# Patient Record
Sex: Female | Born: 1950 | Race: Black or African American | Hispanic: No | Marital: Married | Smoking: Never smoker
Health system: Southern US, Community
[De-identification: ages and names within clinical notes are randomized; demographics above are authoritative.]

## PROBLEM LIST (undated history)

## (undated) ENCOUNTER — Emergency Department (HOSPITAL_COMMUNITY): Admission: EM | Disposition: A | Discharge status: Other Acute Inpatient Hospital | Payer: Medicare Other

## (undated) DIAGNOSIS — R0600 Dyspnea, unspecified: Secondary | ICD-10-CM

## (undated) DIAGNOSIS — R51 Headache: Secondary | ICD-10-CM

## (undated) DIAGNOSIS — I509 Heart failure, unspecified: Secondary | ICD-10-CM

## (undated) DIAGNOSIS — E079 Disorder of thyroid, unspecified: Secondary | ICD-10-CM

## (undated) DIAGNOSIS — M1990 Unspecified osteoarthritis, unspecified site: Secondary | ICD-10-CM

## (undated) DIAGNOSIS — R011 Cardiac murmur, unspecified: Secondary | ICD-10-CM

## (undated) DIAGNOSIS — Z9289 Personal history of other medical treatment: Secondary | ICD-10-CM

## (undated) DIAGNOSIS — I10 Essential (primary) hypertension: Secondary | ICD-10-CM

## (undated) DIAGNOSIS — G4730 Sleep apnea, unspecified: Secondary | ICD-10-CM

## (undated) DIAGNOSIS — T8859XA Other complications of anesthesia, initial encounter: Secondary | ICD-10-CM

## (undated) DIAGNOSIS — J45909 Unspecified asthma, uncomplicated: Secondary | ICD-10-CM

## (undated) DIAGNOSIS — J189 Pneumonia, unspecified organism: Secondary | ICD-10-CM

## (undated) DIAGNOSIS — D649 Anemia, unspecified: Secondary | ICD-10-CM

## (undated) DIAGNOSIS — I499 Cardiac arrhythmia, unspecified: Secondary | ICD-10-CM

## (undated) DIAGNOSIS — K56609 Unspecified intestinal obstruction, unspecified as to partial versus complete obstruction: Secondary | ICD-10-CM

## (undated) DIAGNOSIS — Z950 Presence of cardiac pacemaker: Secondary | ICD-10-CM

## (undated) DIAGNOSIS — C801 Malignant (primary) neoplasm, unspecified: Secondary | ICD-10-CM

## (undated) DIAGNOSIS — Z87442 Personal history of urinary calculi: Secondary | ICD-10-CM

## (undated) DIAGNOSIS — R0609 Other forms of dyspnea: Secondary | ICD-10-CM

## (undated) DIAGNOSIS — M797 Fibromyalgia: Secondary | ICD-10-CM

## (undated) DIAGNOSIS — R319 Hematuria, unspecified: Secondary | ICD-10-CM

## (undated) DIAGNOSIS — Z8616 Personal history of COVID-19: Secondary | ICD-10-CM

## (undated) DIAGNOSIS — T4145XA Adverse effect of unspecified anesthetic, initial encounter: Secondary | ICD-10-CM

## (undated) HISTORY — DX: Other forms of dyspnea: R06.09

## (undated) HISTORY — DX: Dyspnea, unspecified: R06.00

## (undated) HISTORY — DX: Headache: R51

## (undated) HISTORY — PX: DIAGNOSTIC LAPAROSCOPY: SUR761

## (undated) HISTORY — PX: COLONOSCOPY W/ POLYPECTOMY: SHX1380

## (undated) HISTORY — PX: UTERINE FIBROID EMBOLIZATION: SHX825

## (undated) HISTORY — PX: THYROIDECTOMY: SHX17

## (undated) HISTORY — DX: Essential (primary) hypertension: I10

## (undated) HISTORY — PX: EYE SURGERY: SHX253

---
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
## 1983-02-21 HISTORY — PX: GASTROPLASTY VERTICAL BANDED: SUR640

## 1983-02-21 HISTORY — PX: GASTROPLASTY: SHX192

## 1984-02-21 HISTORY — PX: TUBAL LIGATION: SHX77

## 1988-02-21 HISTORY — PX: CHOLECYSTECTOMY: SHX55

## 1990-02-20 HISTORY — PX: ROUX-EN-Y GASTRIC BYPASS: SHX1104

## 1993-02-20 HISTORY — PX: LAPAROSCOPIC LYSIS INTESTINAL ADHESIONS: SUR778

## 1998-02-20 HISTORY — PX: SMALL INTESTINE SURGERY: SHX150

## 2002-02-20 HISTORY — PX: ABDOMINAL ADHESION SURGERY: SHX90

## 2008-04-11 ENCOUNTER — Ambulatory Visit: Payer: Self-pay | Admitting: Family Medicine

## 2008-04-11 DIAGNOSIS — M76899 Other specified enthesopathies of unspecified lower limb, excluding foot: Secondary | ICD-10-CM | POA: Insufficient documentation

## 2008-04-11 DIAGNOSIS — M545 Low back pain, unspecified: Secondary | ICD-10-CM | POA: Insufficient documentation

## 2009-08-31 ENCOUNTER — Ambulatory Visit: Payer: Self-pay | Admitting: Pain Medicine

## 2009-11-17 ENCOUNTER — Encounter
Admission: RE | Admit: 2009-11-17 | Discharge: 2009-11-17 | Payer: Self-pay | Source: Home / Self Care | Attending: Internal Medicine | Admitting: Internal Medicine

## 2010-02-20 HISTORY — PX: URETHRAL DILATION: SUR417

## 2010-03-25 ENCOUNTER — Ambulatory Visit (HOSPITAL_COMMUNITY)
Admission: RE | Admit: 2010-03-25 | Discharge: 2010-03-25 | Disposition: A | Discharge status: Home / Self Care | Payer: Federal, State, Local not specified - PPO | Source: Ambulatory Visit | Attending: Gastroenterology | Admitting: Gastroenterology

## 2010-03-25 DIAGNOSIS — IMO0001 Reserved for inherently not codable concepts without codable children: Secondary | ICD-10-CM | POA: Insufficient documentation

## 2010-03-25 DIAGNOSIS — J45909 Unspecified asthma, uncomplicated: Secondary | ICD-10-CM | POA: Insufficient documentation

## 2010-03-25 DIAGNOSIS — K219 Gastro-esophageal reflux disease without esophagitis: Secondary | ICD-10-CM | POA: Insufficient documentation

## 2010-03-25 DIAGNOSIS — Z9884 Bariatric surgery status: Secondary | ICD-10-CM | POA: Insufficient documentation

## 2010-03-25 DIAGNOSIS — I10 Essential (primary) hypertension: Secondary | ICD-10-CM | POA: Insufficient documentation

## 2010-03-30 ENCOUNTER — Other Ambulatory Visit: Payer: Self-pay | Admitting: Internal Medicine

## 2010-03-30 ENCOUNTER — Ambulatory Visit
Admission: RE | Admit: 2010-03-30 | Discharge: 2010-03-30 | Disposition: A | Discharge status: Home / Self Care | Payer: Federal, State, Local not specified - PPO | Source: Ambulatory Visit | Attending: Internal Medicine | Admitting: Internal Medicine

## 2010-03-30 DIAGNOSIS — I2510 Atherosclerotic heart disease of native coronary artery without angina pectoris: Secondary | ICD-10-CM

## 2010-04-04 ENCOUNTER — Ambulatory Visit (HOSPITAL_COMMUNITY)
Admission: RE | Admit: 2010-04-04 | Discharge: 2010-04-04 | Disposition: A | Discharge status: Home / Self Care | Payer: Federal, State, Local not specified - PPO | Source: Ambulatory Visit | Attending: Internal Medicine | Admitting: Internal Medicine

## 2010-04-04 DIAGNOSIS — R079 Chest pain, unspecified: Secondary | ICD-10-CM | POA: Insufficient documentation

## 2010-04-04 DIAGNOSIS — I2789 Other specified pulmonary heart diseases: Secondary | ICD-10-CM | POA: Insufficient documentation

## 2010-04-04 DIAGNOSIS — R0609 Other forms of dyspnea: Secondary | ICD-10-CM | POA: Insufficient documentation

## 2010-04-04 DIAGNOSIS — Z6841 Body Mass Index (BMI) 40.0 and over, adult: Secondary | ICD-10-CM | POA: Insufficient documentation

## 2010-04-04 DIAGNOSIS — Z9884 Bariatric surgery status: Secondary | ICD-10-CM | POA: Insufficient documentation

## 2010-04-04 DIAGNOSIS — Z01818 Encounter for other preprocedural examination: Secondary | ICD-10-CM | POA: Insufficient documentation

## 2010-04-04 DIAGNOSIS — Z01810 Encounter for preprocedural cardiovascular examination: Secondary | ICD-10-CM | POA: Insufficient documentation

## 2010-04-04 DIAGNOSIS — R0989 Other specified symptoms and signs involving the circulatory and respiratory systems: Secondary | ICD-10-CM | POA: Insufficient documentation

## 2010-04-04 HISTORY — PX: CARDIAC CATHETERIZATION: SHX172

## 2010-04-05 LAB — POCT I-STAT 3, VENOUS BLOOD GAS (G3P V)
Bicarbonate: 27 mEq/L — ABNORMAL HIGH (ref 20.0–24.0)
pH, Ven: 7.269 (ref 7.250–7.300)

## 2010-04-05 LAB — POCT I-STAT 3, ART BLOOD GAS (G3+)
Acid-Base Excess: 3 mmol/L — ABNORMAL HIGH (ref 0.0–2.0)
Bicarbonate: 29.7 mEq/L — ABNORMAL HIGH (ref 20.0–24.0)
pCO2 arterial: 55.8 mmHg — ABNORMAL HIGH (ref 35.0–45.0)
pH, Arterial: 7.334 — ABNORMAL LOW (ref 7.350–7.400)
pO2, Arterial: 79 mmHg — ABNORMAL LOW (ref 80.0–100.0)

## 2010-04-06 NOTE — Procedures (Signed)
NAME:  Jillian Eaton, Jillian Eaton NO.:  [id number]  MEDICAL RECORD NO.:  [id number]           PATIENT TYPE:  O  LOCATION:  [facility]                         FACILITY:  [facility]  PHYSICIAN:  [place] Delno Blaisdell, MD         DATE OF BIRTH:  1950-09-03  DATE OF PROCEDURE:  04/04/2010 DATE OF DISCHARGE:  04/04/2010                           CARDIAC CATHETERIZATION   LEFT HEART CATHETERIZATION  OPERATOR:  [place] Tobey Lippard, MD, and Landry Corporal, MD  INDICATION:  Dyspnea on exertion and chest pressure.  HISTORY OF PRESENT ILLNESS:  Jillian Eaton is a morbidly obese (BMI greater than 79) female with a history of failed gastric bypass x2 with an increasing weight gain and increasing shortness of breath as well as new- onset dyspnea on exertion.  She reports that she can only walk about 10 feet before she becomes short of breath and has chest pressure which she says get better with rest.  She has numerous cardiac risk factors and given the high likelihood of false positive stress test, I have referred her for cardiac catheterization, both left and right heart as she has had elevated RVSP on echocardiogram of approximately 30 to 31 mmHg.  PROCEDURE:  The patient was brought into the cardiac catheterization lab, sterilely prepped and draped in the usual fashion.  The area around the right femoral artery and right brachial vein were cleansed and draped to allow an attempt at radial arterial and brachiocephalic venous access.  IV was not able to be obtained prior to the procedure given her body habitus.  With difficulty in assessing the vein, the ultrasound was eventually used to identify the right brachiocephalic vein, however, cannulation with needle and wire was not possible as the vein was very small in caliber.  After mild local bleeding was controlled, we did turn our attention to the right femoral vein and with great difficulty using the ultrasound, the right femoral vein was accessed by Dr.  Herbie Baltimore using a straight wire and a needle.  After venous access was obtained, the attention was turned to the right radial artery by Dr. Herbie Baltimore and simultaneous to that right heart catheterization was performed by myself without any difficulty.  The right radial artery was successfully cannulated and subsequently left coronary artery system was selectively injected with a 5-French TIG 4.0 catheter, however the right coronary artery could not be cannulated with the TIG catheter and was eventually cannulated with a JR-4 catheter.  LV pressure was measured with a pigtail catheter.  Estimated blood loss was about 30 mL.  There were no acute complications.  The patient received a total of 9 mg of Versed throughout the procedure as well as 225 mcg of fentanyl and was at no point greater than moderately sedated.  She received 5000 units of heparin about 10 mL of a radial cocktail.  FINDINGS: 1. Left main - short, no disease. 2. LAD - no significant disease. 3. Left circumflex, no significant disease. 4. RCA - dominant, no disease, large-caliber vessel. 5. LVEDP = 20 mmHg. 6. RA - 12. 7. RV 38/12. 8. PA - 43/19 (31). 9. PCWP - 24.  10.TPG - 7. 11.Fick cardiac output/Fick cardiac index - 10.56/3.84. 12.Thermodilution cardiac output/thermodilution cardiac index -     6.78/2.47. 13.Aortic saturation - 94%. 14.PA saturation - 68%.  IMPRESSION: 1. No significant obstructive coronary artery disease. 2. LVEDP = 20 mmHg. 3. Borderline pulmonary venous hypertension. 4. High cardiac output.  PLAN:  I discussed the results with Jillian Eaton and with her husband, of course focus on weight loss.  I believe her 20-pound weight gain in the last few months may be contributing her shortness of breath in addition to a blood pressure control and maybe an indication for calcium channel blocker.  At best her pulmonary hypertension is borderline to mild which is also agrees well with her echocardiogram  and therefore in the future echocardiogram could be useful following her pulmonary pressures.  After the sheath removal, a TR band was held in place and the venous site was held with venous pressure approximately 10 minutes until bleeding was stopped.  The patient was then placed on 1 hour of bedrest and will have the radial TR band removed once the artery is healed.  A plan is to discharge home today with followup in the office.     [place] Vallorie Niccoli, MD     CH/MEDQ  D:  04/04/2010  T:  04/05/2010  Job:  [id number]  Electronically Signed by K. Loxley Cibrian M.D. on 04/06/2010 10:11:36 AM

## 2010-05-13 ENCOUNTER — Other Ambulatory Visit: Payer: Self-pay | Admitting: Urology

## 2010-05-13 ENCOUNTER — Encounter (HOSPITAL_COMMUNITY): Payer: Federal, State, Local not specified - PPO

## 2010-05-13 LAB — CBC
HCT: 33 % — ABNORMAL LOW (ref 36.0–46.0)
Hemoglobin: 9.9 g/dL — ABNORMAL LOW (ref 12.0–15.0)
MCH: 25.6 pg — ABNORMAL LOW (ref 26.0–34.0)
MCV: 85.5 fL (ref 78.0–100.0)
Platelets: 214 10*3/uL (ref 150–400)
RBC: 3.86 MIL/uL — ABNORMAL LOW (ref 3.87–5.11)
WBC: 7.8 10*3/uL (ref 4.0–10.5)

## 2010-05-13 LAB — BASIC METABOLIC PANEL
BUN: 15 mg/dL (ref 6–23)
CO2: 30 mEq/L (ref 19–32)
Chloride: 106 mEq/L (ref 96–112)
Creatinine, Ser: 0.67 mg/dL (ref 0.4–1.2)
Glucose, Bld: 90 mg/dL (ref 70–99)
Potassium: 4.2 mEq/L (ref 3.5–5.1)

## 2010-05-13 LAB — SURGICAL PCR SCREEN: Staphylococcus aureus: NEGATIVE

## 2010-05-16 ENCOUNTER — Emergency Department (HOSPITAL_COMMUNITY): Payer: Federal, State, Local not specified - PPO

## 2010-05-16 ENCOUNTER — Emergency Department (HOSPITAL_COMMUNITY)
Admission: EM | Admit: 2010-05-16 | Discharge: 2010-05-17 | Disposition: A | Discharge status: Home / Self Care | Payer: Federal, State, Local not specified - PPO | Attending: Emergency Medicine | Admitting: Emergency Medicine

## 2010-05-16 ENCOUNTER — Inpatient Hospital Stay (INDEPENDENT_AMBULATORY_CARE_PROVIDER_SITE_OTHER)
Admission: RE | Admit: 2010-05-16 | Discharge: 2010-05-16 | Disposition: A | Discharge status: Home / Self Care | Payer: Federal, State, Local not specified - PPO | Source: Ambulatory Visit | Attending: Family Medicine | Admitting: Family Medicine

## 2010-05-16 DIAGNOSIS — R0989 Other specified symptoms and signs involving the circulatory and respiratory systems: Secondary | ICD-10-CM | POA: Insufficient documentation

## 2010-05-16 DIAGNOSIS — R0609 Other forms of dyspnea: Secondary | ICD-10-CM

## 2010-05-16 DIAGNOSIS — R0602 Shortness of breath: Secondary | ICD-10-CM | POA: Insufficient documentation

## 2010-05-16 DIAGNOSIS — Z79899 Other long term (current) drug therapy: Secondary | ICD-10-CM | POA: Insufficient documentation

## 2010-05-16 DIAGNOSIS — I10 Essential (primary) hypertension: Secondary | ICD-10-CM | POA: Insufficient documentation

## 2010-05-16 DIAGNOSIS — D649 Anemia, unspecified: Secondary | ICD-10-CM

## 2010-05-16 DIAGNOSIS — E669 Obesity, unspecified: Secondary | ICD-10-CM | POA: Insufficient documentation

## 2010-05-16 DIAGNOSIS — R351 Nocturia: Secondary | ICD-10-CM | POA: Insufficient documentation

## 2010-05-16 DIAGNOSIS — R0601 Orthopnea: Secondary | ICD-10-CM | POA: Insufficient documentation

## 2010-05-16 DIAGNOSIS — R609 Edema, unspecified: Secondary | ICD-10-CM | POA: Insufficient documentation

## 2010-05-16 DIAGNOSIS — R350 Frequency of micturition: Secondary | ICD-10-CM | POA: Insufficient documentation

## 2010-05-16 LAB — DIFFERENTIAL
Eosinophils Relative: 2 % (ref 0–5)
Lymphocytes Relative: 25 % (ref 12–46)
Lymphs Abs: 2.3 10*3/uL (ref 0.7–4.0)
Monocytes Relative: 7 % (ref 3–12)
Neutrophils Relative %: 66 % (ref 43–77)

## 2010-05-16 LAB — POCT URINALYSIS DIP (DEVICE)
Bilirubin Urine: NEGATIVE
Ketones, ur: NEGATIVE mg/dL
Protein, ur: NEGATIVE mg/dL
Specific Gravity, Urine: 1.015 (ref 1.005–1.030)
pH: 6.5 (ref 5.0–8.0)

## 2010-05-16 LAB — BASIC METABOLIC PANEL
BUN: 14 mg/dL (ref 6–23)
CO2: 30 mEq/L (ref 19–32)
Chloride: 107 mEq/L (ref 96–112)
Creatinine, Ser: 0.68 mg/dL (ref 0.4–1.2)
Glucose, Bld: 99 mg/dL (ref 70–99)
Potassium: 4.5 mEq/L (ref 3.5–5.1)

## 2010-05-16 LAB — POCT I-STAT, CHEM 8
BUN: 19 mg/dL (ref 6–23)
Calcium, Ion: 1.09 mmol/L — ABNORMAL LOW (ref 1.12–1.32)
Chloride: 105 mEq/L (ref 96–112)
Creatinine, Ser: 0.8 mg/dL (ref 0.4–1.2)

## 2010-05-16 LAB — CBC
HCT: 34.3 % — ABNORMAL LOW (ref 36.0–46.0)
MCH: 26.5 pg (ref 26.0–34.0)
MCV: 84.1 fL (ref 78.0–100.0)
RBC: 4.08 MIL/uL (ref 3.87–5.11)
WBC: 9.1 10*3/uL (ref 4.0–10.5)

## 2010-05-16 LAB — OCCULT BLOOD, POC DEVICE: Fecal Occult Bld: NEGATIVE

## 2010-05-20 ENCOUNTER — Ambulatory Visit (HOSPITAL_COMMUNITY)
Admission: RE | Admit: 2010-05-20 | Discharge: 2010-05-20 | Disposition: A | Discharge status: Home / Self Care | Payer: Federal, State, Local not specified - PPO | Source: Ambulatory Visit | Attending: Urology | Admitting: Urology

## 2010-05-20 DIAGNOSIS — D649 Anemia, unspecified: Secondary | ICD-10-CM | POA: Insufficient documentation

## 2010-05-20 DIAGNOSIS — Z01812 Encounter for preprocedural laboratory examination: Secondary | ICD-10-CM | POA: Insufficient documentation

## 2010-05-20 DIAGNOSIS — IMO0001 Reserved for inherently not codable concepts without codable children: Secondary | ICD-10-CM | POA: Insufficient documentation

## 2010-05-20 DIAGNOSIS — I10 Essential (primary) hypertension: Secondary | ICD-10-CM | POA: Insufficient documentation

## 2010-05-20 DIAGNOSIS — R310 Gross hematuria: Secondary | ICD-10-CM | POA: Insufficient documentation

## 2010-05-20 DIAGNOSIS — K219 Gastro-esophageal reflux disease without esophagitis: Secondary | ICD-10-CM | POA: Insufficient documentation

## 2010-05-20 DIAGNOSIS — J45909 Unspecified asthma, uncomplicated: Secondary | ICD-10-CM | POA: Insufficient documentation

## 2010-05-20 DIAGNOSIS — N35919 Unspecified urethral stricture, male, unspecified site: Secondary | ICD-10-CM | POA: Insufficient documentation

## 2010-05-27 ENCOUNTER — Encounter: Payer: Federal, State, Local not specified - PPO | Admitting: Hematology and Oncology

## 2010-06-01 ENCOUNTER — Encounter (HOSPITAL_BASED_OUTPATIENT_CLINIC_OR_DEPARTMENT_OTHER): Payer: Federal, State, Local not specified - PPO | Admitting: Hematology and Oncology

## 2010-06-01 ENCOUNTER — Other Ambulatory Visit: Payer: Self-pay | Admitting: Hematology and Oncology

## 2010-06-01 DIAGNOSIS — I10 Essential (primary) hypertension: Secondary | ICD-10-CM

## 2010-06-01 DIAGNOSIS — D649 Anemia, unspecified: Secondary | ICD-10-CM

## 2010-06-01 DIAGNOSIS — E119 Type 2 diabetes mellitus without complications: Secondary | ICD-10-CM

## 2010-06-01 LAB — CBC & DIFF AND RETIC
Basophils Absolute: 0 10*3/uL (ref 0.0–0.1)
EOS%: 2.4 % (ref 0.0–7.0)
HCT: 34.5 % — ABNORMAL LOW (ref 34.8–46.6)
HGB: 10.5 g/dL — ABNORMAL LOW (ref 11.6–15.9)
Immature Retic Fract: 11 % — ABNORMAL HIGH (ref 0.00–10.70)
LYMPH%: 23.5 % (ref 14.0–49.7)
MCH: 26.2 pg (ref 25.1–34.0)
MCV: 86 fL (ref 79.5–101.0)
MONO%: 8 % (ref 0.0–14.0)
NEUT%: 65.7 % (ref 38.4–76.8)
Platelets: 164 10*3/uL (ref 145–400)
lymph#: 1.3 10*3/uL (ref 0.9–3.3)

## 2010-06-01 LAB — MORPHOLOGY: PLT EST: ADEQUATE

## 2010-06-03 LAB — PROTEIN ELECTROPHORESIS, SERUM, WITH REFLEX: Gamma Globulin: 16.8 % (ref 11.1–18.8)

## 2010-06-03 LAB — COMPREHENSIVE METABOLIC PANEL
Alkaline Phosphatase: 90 U/L (ref 39–117)
CO2: 27 mEq/L (ref 19–32)
Creatinine, Ser: 0.63 mg/dL (ref 0.40–1.20)
Glucose, Bld: 83 mg/dL (ref 70–99)
Total Bilirubin: 0.3 mg/dL (ref 0.3–1.2)

## 2010-06-03 LAB — IRON AND TIBC
%SAT: 9 % — ABNORMAL LOW (ref 20–55)
Iron: 43 ug/dL (ref 42–145)
TIBC: 474 ug/dL — ABNORMAL HIGH (ref 250–470)

## 2010-06-03 LAB — HEMOGLOBINOPATHY EVALUATION
Hemoglobin Other: 0 % (ref 0.0–0.0)
Hgb S Quant: 0 % (ref 0.0–0.0)

## 2010-06-03 LAB — DIRECT ANTIGLOBULIN TEST (NOT AT ARMC)
DAT (Complement): NEGATIVE
DAT IgG: NEGATIVE

## 2010-06-03 LAB — LACTATE DEHYDROGENASE: LDH: 241 U/L (ref 94–250)

## 2010-06-03 LAB — HAPTOGLOBIN: Haptoglobin: 147 mg/dL (ref 16–200)

## 2010-06-03 NOTE — Op Note (Signed)
  NAME:  DOMINICK, ZERTUCHE NO.:  [id number]  MEDICAL RECORD NO.:  [id number]           PATIENT TYPE:  O  LOCATION:  [facility]                         FACILITY:  [facility]  PHYSICIAN:  Nabeel Gladson I. Patsi Sears, M.D.DATE OF BIRTH:  Jul 24, 1950  DATE OF PROCEDURE:  05/20/2010 DATE OF DISCHARGE:  05/20/2010                              OPERATIVE REPORT   PREOPERATIVE DIAGNOSIS:  Gross hematuria.  POSTOPERATIVE DIAGNOSIS:  Gross hematuria.  OPERATION:  Urethral dilation and rigid cystoscopy.  SURGEON:  Isiaih Hollenbach I. Patsi Sears, M.D.  ANESTHESIA:  General LMA.  PREPARATION:  After appropriate preanesthesia, the patient was brought to operating room, placed on the operating room in the dorsal supine position where general LMA anesthesia was induced.  She was then replaced in dorsal lithotomy position where the pubis was prepped with Betadine solution and draped in usual fashion.  HISTORY:  The patient is a 60 year old morbidly obese female with a history of intermittent gross painless hematuria.  CT scan was negative. The patient has had flexible cystoscopy in the office which was negative but demands more investigative cystoscopy under anesthesia.  DESCRIPTION OF PROCEDURE:  Urethra was evaluated and the pelvic floor was evaluated and it was felt to be normal.  The urethra appeared to be stenotic as it will not allow the larger operative scope.  Therefore, it was dilated from a 16-French to a 26-French with the female urethral sounds.  Following this, cystoscopy was easily accomplished and showed a normal-appearing bladder.  There was no trabeculation, no bladder stone, no bladder tumor, no bladder diverticular formation.  There was no evidence of fistula formation.  The bladder was drained of fluid, and the patient was then awakened and taken to the recovery room in good condition.     Lakiya Cottam I. Patsi Sears, M.D.     SIT/MEDQ  D:  05/20/2010  T:  05/20/2010  Job:   [id number]  Electronically Signed by Jethro Bolus M.D. on 06/03/2010 05:49:50 PM

## 2010-06-06 ENCOUNTER — Encounter (HOSPITAL_BASED_OUTPATIENT_CLINIC_OR_DEPARTMENT_OTHER): Payer: Federal, State, Local not specified - PPO | Admitting: Hematology and Oncology

## 2010-06-06 DIAGNOSIS — D649 Anemia, unspecified: Secondary | ICD-10-CM

## 2010-07-25 ENCOUNTER — Other Ambulatory Visit: Payer: Self-pay | Admitting: Hematology and Oncology

## 2010-07-25 ENCOUNTER — Encounter (HOSPITAL_BASED_OUTPATIENT_CLINIC_OR_DEPARTMENT_OTHER): Payer: Federal, State, Local not specified - PPO | Admitting: Hematology and Oncology

## 2010-07-25 DIAGNOSIS — D649 Anemia, unspecified: Secondary | ICD-10-CM

## 2010-07-25 LAB — IRON AND TIBC
%SAT: 20 % (ref 20–55)
Iron: 76 ug/dL (ref 42–145)

## 2010-07-25 LAB — BASIC METABOLIC PANEL
CO2: 29 mEq/L (ref 19–32)
Calcium: 10 mg/dL (ref 8.4–10.5)
Sodium: 142 mEq/L (ref 135–145)

## 2010-07-25 LAB — VITAMIN B12: Vitamin B-12: 392 pg/mL (ref 211–911)

## 2010-07-25 LAB — FERRITIN: Ferritin: 210 ng/mL (ref 10–291)

## 2010-07-25 LAB — CBC WITH DIFFERENTIAL/PLATELET
BASO%: 0.4 % (ref 0.0–2.0)
LYMPH%: 23.2 % (ref 14.0–49.7)
MCHC: 33.3 g/dL (ref 31.5–36.0)
MONO#: 0.4 10*3/uL (ref 0.1–0.9)
Platelets: 163 10*3/uL (ref 145–400)
RBC: 3.9 10*6/uL (ref 3.70–5.45)
WBC: 5.7 10*3/uL (ref 3.9–10.3)
lymph#: 1.3 10*3/uL (ref 0.9–3.3)

## 2010-07-28 ENCOUNTER — Encounter (HOSPITAL_BASED_OUTPATIENT_CLINIC_OR_DEPARTMENT_OTHER): Payer: Federal, State, Local not specified - PPO | Admitting: Hematology and Oncology

## 2010-07-28 DIAGNOSIS — R319 Hematuria, unspecified: Secondary | ICD-10-CM

## 2010-07-28 DIAGNOSIS — D649 Anemia, unspecified: Secondary | ICD-10-CM

## 2010-08-26 ENCOUNTER — Other Ambulatory Visit (HOSPITAL_COMMUNITY): Payer: Federal, State, Local not specified - PPO

## 2010-08-26 ENCOUNTER — Inpatient Hospital Stay (HOSPITAL_COMMUNITY): Payer: Federal, State, Local not specified - PPO

## 2010-08-26 ENCOUNTER — Inpatient Hospital Stay (HOSPITAL_COMMUNITY)
Admission: EM | Admit: 2010-08-26 | Discharge: 2010-08-31 | DRG: 097 | Disposition: A | Discharge status: Home / Self Care | Payer: Federal, State, Local not specified - PPO | Attending: Internal Medicine | Admitting: Internal Medicine

## 2010-08-26 ENCOUNTER — Emergency Department (HOSPITAL_COMMUNITY): Payer: Federal, State, Local not specified - PPO

## 2010-08-26 DIAGNOSIS — R0602 Shortness of breath: Secondary | ICD-10-CM | POA: Diagnosis present

## 2010-08-26 DIAGNOSIS — J45901 Unspecified asthma with (acute) exacerbation: Principal | ICD-10-CM | POA: Diagnosis present

## 2010-08-26 DIAGNOSIS — J209 Acute bronchitis, unspecified: Secondary | ICD-10-CM | POA: Diagnosis present

## 2010-08-26 DIAGNOSIS — Z9884 Bariatric surgery status: Secondary | ICD-10-CM

## 2010-08-26 DIAGNOSIS — R791 Abnormal coagulation profile: Secondary | ICD-10-CM | POA: Diagnosis present

## 2010-08-26 DIAGNOSIS — IMO0001 Reserved for inherently not codable concepts without codable children: Secondary | ICD-10-CM | POA: Diagnosis present

## 2010-08-26 DIAGNOSIS — I498 Other specified cardiac arrhythmias: Secondary | ICD-10-CM | POA: Diagnosis present

## 2010-08-26 DIAGNOSIS — D6489 Other specified anemias: Secondary | ICD-10-CM | POA: Diagnosis present

## 2010-08-26 DIAGNOSIS — K5900 Constipation, unspecified: Secondary | ICD-10-CM | POA: Diagnosis not present

## 2010-08-26 DIAGNOSIS — I10 Essential (primary) hypertension: Secondary | ICD-10-CM | POA: Diagnosis present

## 2010-08-26 LAB — BASIC METABOLIC PANEL
BUN: 13 mg/dL (ref 6–23)
GFR calc Af Amer: >60 mL/min (ref >60)
GFR calc non Af Amer: >60 mL/min (ref >60)
Potassium: 4 mEq/L (ref 3.5–5.1)
Sodium: 141 mEq/L (ref 135–145)

## 2010-08-26 LAB — COMPREHENSIVE METABOLIC PANEL
ALT: 23 U/L (ref 0–35)
AST: 29 U/L (ref 0–37)
Alkaline Phosphatase: 128 U/L — ABNORMAL HIGH (ref 39–117)
CO2: 29 mEq/L (ref 19–32)
Chloride: 102 mEq/L (ref 96–112)
Creatinine, Ser: 0.71 mg/dL (ref 0.50–1.10)
GFR calc non Af Amer: >60 mL/min (ref >60)
Potassium: 4.1 mEq/L (ref 3.5–5.1)
Total Bilirubin: 0.3 mg/dL (ref 0.3–1.2)

## 2010-08-26 LAB — TROPONIN I: Troponin I: <0.3 ng/mL (ref <0.30)

## 2010-08-26 LAB — URINALYSIS, ROUTINE W REFLEX MICROSCOPIC
Bilirubin Urine: NEGATIVE
Nitrite: NEGATIVE
Specific Gravity, Urine: 1.015 (ref 1.005–1.030)
pH: 6 (ref 5.0–8.0)

## 2010-08-26 LAB — CBC
HCT: 38.9 % (ref 36.0–46.0)
MCV: 89 fL (ref 78.0–100.0)
RBC: 4.37 MIL/uL (ref 3.87–5.11)
WBC: 6.9 10*3/uL (ref 4.0–10.5)

## 2010-08-26 LAB — DIFFERENTIAL
Basophils Relative: 0 % (ref 0–1)
Eosinophils Relative: 0 % (ref 0–5)
Lymphocytes Relative: 9 % — ABNORMAL LOW (ref 12–46)
Neutrophils Relative %: 86 % — ABNORMAL HIGH (ref 43–77)

## 2010-08-26 LAB — CARDIAC PANEL(CRET KIN+CKTOT+MB+TROPI): CK, MB: 3.6 ng/mL (ref 0.3–4.0)

## 2010-08-26 LAB — URINE MICROSCOPIC-ADD ON

## 2010-08-26 LAB — PRO B NATRIURETIC PEPTIDE: Pro B Natriuretic peptide (BNP): 699.5 pg/mL — ABNORMAL HIGH (ref 0–125)

## 2010-08-26 LAB — D-DIMER, QUANTITATIVE: D-Dimer, Quant: 1.2 ug/mL-FEU — ABNORMAL HIGH (ref 0.00–0.48)

## 2010-08-27 ENCOUNTER — Inpatient Hospital Stay (HOSPITAL_COMMUNITY): Payer: Federal, State, Local not specified - PPO

## 2010-08-27 DIAGNOSIS — I369 Nonrheumatic tricuspid valve disorder, unspecified: Secondary | ICD-10-CM

## 2010-08-27 LAB — CARDIAC PANEL(CRET KIN+CKTOT+MB+TROPI)
CK, MB: 3.2 ng/mL (ref 0.3–4.0)
Relative Index: 1.6 (ref 0.0–2.5)
Troponin I: <0.3 ng/mL (ref <0.30)

## 2010-08-27 LAB — BLOOD GAS, ARTERIAL
Acid-Base Excess: 2.7 mmol/L — ABNORMAL HIGH (ref 0.0–2.0)
Drawn by: 103701
FIO2: 0.21 %
O2 Saturation: 88.6 %
Patient temperature: 98.6

## 2010-08-27 MED ORDER — IOHEXOL 300 MG/ML  SOLN
100.0000 mL | Freq: Once | INTRAMUSCULAR | Status: AC | PRN
  Administered 2010-08-27: 100 mL via INTRAVENOUS

## 2010-08-28 LAB — BASIC METABOLIC PANEL
Calcium: 9.1 mg/dL (ref 8.4–10.5)
Creatinine, Ser: 1.05 mg/dL (ref 0.50–1.10)
GFR calc non Af Amer: 54 mL/min — ABNORMAL LOW (ref >60)
Sodium: 135 mEq/L (ref 135–145)

## 2010-08-28 LAB — CBC
MCH: 28.5 pg (ref 26.0–34.0)
MCHC: 31.5 g/dL (ref 30.0–36.0)
MCV: 90.2 fL (ref 78.0–100.0)
Platelets: 142 10*3/uL — ABNORMAL LOW (ref 150–400)

## 2010-09-01 NOTE — Discharge Summary (Signed)
  NAMEJAIDAN, STACHNIK NO.:  [id number]  MEDICAL RECORD NO.:  [id number]  LOCATION:                                 FACILITY:  PHYSICIAN:  Pleas Koch, MD             DATE OF BIRTH:  DATE OF ADMISSION: DATE OF DISCHARGE:                              DISCHARGE SUMMARY   ADDENDUM: Addendum to dictation number [id number]  The patient is doing well.  On day of discharge, she was having no significant complaints.  She expressed some concern about shortness of breath, however, otherwise, she is doing well.  Pulse is 63, her temperature is 97.8, respiration is 20, blood pressure 137 to 110 over 77 to 79, saturating 99% on 2 L.  She states that she still felt like she was wheezy overnight and she was a little bit concerned about the length of her shortness of breath episode.  However, she feels that this she will resolve from this.  She has had sleep apnea workup three times in the past and I have recommended to her that if this continues, she will need to follow up with pulmonologist as she seems to have this every year.  She complained of significant constipation, has not passed stool since Thursday despite having good appetite and eating well.  I am discharging her home on polyethylene glycol 17 g three times a day. I expect that this may be multifactorial given the fact that she has history of chronic pain, uses opiates as well.  She had no abdominal tenderness.  She had no nausea or vomiting.  Chest clinically clear.  S1 and S2.  No murmurs, rubs or gallops.  She is morbidly obese.          ______________________________ Pleas Koch, MD     JS/MEDQ  D:  08/31/2010  T:  08/31/2010  Job:  [id number]  cc:   Candyce Churn. Allyne Gee, M.D. Fax: [phone]  Electronically Signed by Pleas Koch MD on 09/01/2010 09:05:45 PM

## 2010-09-08 NOTE — Discharge Summary (Signed)
NAMEANIEYA, HELMAN NO.:  [id number]  MEDICAL RECORD NO.:  [id number]  LOCATION:  1513                         FACILITY:  [facility]  PHYSICIAN:  Ladell Pier, M.D.   DATE OF BIRTH:  [birth year]  DATE OF ADMISSION:  08/26/2010 DATE OF DISCHARGE:  08/31/2010                              DISCHARGE SUMMARY   DISCHARGE DIAGNOSES: 1. Asthma exacerbation/acute bronchitis with negative CT angio of the     chest, negative Dopplers, negative 2-D echo. 2. Bradycardia, asymptomatic.  The patient is to follow up with her     cardiologist outpatient. 3. Morbid obesity with history of gastric bypass. 4. Hypertension. 5. Anemia, followed outpatient by Dr. Dalene Carrow. 6. Fibromyalgia. 7. History of fibroids. 8. Tonsillectomy and adenoidectomy. 9. Status post cholecystectomy. 10.History of small-bowel obstruction. 11.History of chest pain and cardiac cath in February 2012 that was     negative. 12.Constipation.  DISCHARGE MEDICATIONS: 1. Albuterol nebulizer every 6 hours as needed. 2. Tessalon Perles 100 mg 3 times a day as needed. 3. Avelox 400 mg daily. 4. Prednisone 40 mg daily for 3 days, then 20 daily for 3 days, then     10 daily for 3 days, then stop. 5. Norvasc 2.5 mg daily. 6. Calcium carbonate over-the-counter daily. 7. Celebrex 200 mg twice daily. 8. Vitamin B12 injections monthly. 9. Cymbalta 60 mg every evening. 10.Hydrocodone 10/325 three times daily. 11.Losartan 100 mg daily. 12.Lyrica 200 mg twice daily. 13.Tizanidine 4 mg four times daily as needed. 14.Toviaz 8 mg daily. 15.Vitamin D2 Mondays, Wednesdays and Fridays. 16.Ambien CR 12.5 mg q.h.s. p.r.n.  FOLLOWUP APPOINTMENTS:  The patient to follow up with Dr. Lelon Perla in 1 week.  For the bradycardia, she will follow up with Dr. Rennis Golden and for her anemia Dr. Dalene Carrow.  PROCEDURES: 1. CT angio of the chest: No evidence of PE, mild right basilar     scarring versus atelectasis. 2. Chest x-ray, no acute  findings 3. A 2-D echo:  LV systolic function was normal at 60%.  Regional wall     motion abnormalities cannot be excluded.  RV was poorly visualized.     There was no pericardial effusion. 4. Dopplers of lower extremity to rule out DVT negative.  CONSULTANT:  None.  HISTORY OF PRESENT ILLNESS:  The patient is a 60 year old African- American female with past medical history significant for hypertension and obesity.  The patient presented secondary to shortness of breath for the past 5 days.  She has been short of breath.  She presented to her PCP's office, was given neb treatments and Z-Pak.  She is taking medications but does not feel better, so she presented to the emergency room.  In the emergency room, she was diagnosed with asthma exacerbation.  Her sats were dropping into the 70s and 80s with ambulation.  She normally has mild asthma but she has never been intubated for her asthma.  She only had severe episodes when she was a child.  Past medical history, family history, social history, meds, allergies, review of systems; per admission H and P.  DISCHARGE PHYSICAL EXAM:  VITAL SIGNS:  Temperature 97.8, pulse 61, respirations 16, blood pressure 120/72, pulse ox 99% on  2 L. GENERAL:  The patient is sitting up in bed, well nourished African- American female. HEENT:  Normocephalic, atraumatic.  Pupils reactive to light without erythema. CARDIOVASCULAR:  Regular rate and rhythm. LUNGS:  She did have some rhonchi and some minimal wheezes. ABDOMEN:  Positive bowel sounds. EXTREMITIES:  Without edema.  HOSPITAL COURSE: 1. Aspirin exacerbation/dyspnea:  The patient was admitted to the     hospital, placed on Solu-Medrol, antibiotic and neb treatments.     She was also placed on Lovenox initially and she had a CT angio of     the chest that was negative for PE.  Her Lovenox was then     discontinued.  The patient is doing well and will be discharged     home on steroids,  antibiotics and nebs to follow up with PCP.  Her     oxygen sats does drop when she is ambulating and is likely to be a     component of obesity hypoventilation syndrome.  We will discharge     her with home oxygen and she could follow up with her PCP to taper     off the oxygen. 2. Constipation:  She did complain of some constipation, tried     MiraLax, Dulcolax suppositories and Senokot.  She is going to try     some more Senokot today as she has been unsuccessful.  She,     however, does not have any abdominal pain or nausea, vomiting. 3. Anemia:  She gets iron infusion per Dr. Dalene Carrow.  She has follow up     in the office for continued treatment. 4. Bradycardia:  Her heart rate was noted to be in the 50s, it will go     up to the 80s and in the 60s.  The patient was asymptomatic during     those episodes, not sure if that is new or not.  She recently had a     cath per Dr. Rennis Golden.  She could follow up with him.  Did discuss     this with the patient.  DISCHARGE LABORATORY DATA:  Sodium 135, potassium 4.7, chloride 98, CO2 30, glucose 122, BUN 33, creatinine 1.05.  WBC 9.4, hemoglobin 10.5, MCV 90.2, platelet 142.  Time spent with the patient and doing this discharge is approximately 45 minutes.     Ladell Pier, M.D.     NJ/MEDQ  D:  08/30/2010  T:  08/30/2010  Job:  [id number]  cc:   Dr. Saunders  [name], MD Fax: [phone]  Laurice Record, M.D. Fax: [phone]  Electronically Signed by Ladell Pier M.D. on 09/08/2010 11:03:13 PM

## 2010-09-08 NOTE — H&P (Signed)
Jillian Eaton, Jillian Eaton NO.:  [id number]  MEDICAL RECORD NO.:  [id number]  LOCATION:  [facility]                         FACILITY:  [facility]  PHYSICIAN:  Ladell Pier, M.D.   DATE OF BIRTH:  08/19/1950  DATE OF ADMISSION:  08/26/2010 DATE OF DISCHARGE:                             HISTORY & PHYSICAL   CHIEF COMPLAINT:  Shortness of breath.  HISTORY OF PRESENT ILLNESS:  The patient is a 60 year old morbidly obese African-American female who presented to the emergency room with shortness of breath.  Per discussion with the patient, for the past 5 days she has been short of breath.  She presented to her PCP's office and was given neb treatments and a Z-Pak.  She is taking the medications but does not feel better, so she presented to the emergency room.  She normally does not have really bad asthma except when she was a child. She has never been intubated..  She does not have any chest pain.  She recently traveled to [facility] on August 15, 2010.  She also complains of some nausea and vomiting and pain on her left side.  PAST MEDICAL HISTORY: 1. Significant for morbid obesity with a history of gastric bypass. 2. Hypertension. 3. Anemia. 4. Fibromyalgia. 5. Fibroids. 6. Tonsillectomy and adenoidectomy. 7. Status post cholecystectomy. 8. History of small bowel obstruction. 9. History of chest pain with cardiac cath in February 2012 that was     negative.  FAMILY HISTORY:  Both parents are deceased.  Mother had epilepsy and died of cancer in her 15s.  Father died in his 47s from heart attack.  SOCIAL HISTORY:  The patient is married and she does not smoke.  She drinks socially.  She has three children.  She is retired.  MEDICATIONS: 1. Azithromycin 250 mg daily. 2. Ambien CR 12.5 mg at bedtime p.r.n. 3. Vitamin D 50,000 units Mondays, Wednesdays, Fridays. 4. Toviaz 8 mg daily. 5. Tizanidine 4 mg 4 times daily as needed. 6. Lyrica 200 mg twice daily. 7. Losartan 100 mg  every morning. 8. Hydrocodone/APAP 10/325 three times a day. 9. Cymbalta 60 mg every evening. 10.Vitamin B12 injections monthly and latter part of the month. 11.Celebrex 200 mg twice daily. 12.Calcium over-the-counter daily. 13.Amlodipine 2.5 mg daily.  ALLERGIES:  PERCOCET.  REVIEW OF SYSTEMS:  Negative, otherwise, as stated in the HPI.  PHYSICAL EXAM:  VITAL SIGNS:  Temperature 98.9, pulse of 86, respirations 20, blood pressure 168/94, pulse ox 96% on room and dropped to the 80s with ambulation. GENERAL:  The patient is sitting up on the stretcher, morbidly obese African-American female. HEENT: Normocephalic, atraumatic.  Pupils reactive to light.  Throat is without erythema. CARDIOVASCULAR:  Regular rate and rhythm. LUNGS:  Her lungs were clear bilaterally. ABDOMEN:  Obese.  Positive bowel sounds. EXTREMITIES:  A 2+ edema and both fairly obese lower extremities.  LABORATORY DATA:  Urine negative.  WBC 6.9, hemoglobin 12.2, MCV 9, platelet 116.  Sodium 141, potassium 4.0, chloride 103, CO2 of 29, BUN 13, creatinine 0.7, glucose 140.  Chest x-ray, stable cardiomegaly and vascular congestion.  ASSESSMENT/PLAN: 1. Dyspnea. 2. Hypertension. 3. Fibromyalgia. 4. Morbid obesity.  We will admit the patient to  the hospital with history of recent travel. We will put her on Lovenox and get a V/Q scan, D-dimer and Dopplers of the legs to make sure she does not have PE.  I does not hear any wheezing but we will put her on IV Avelox and Solu-Medrol along with neb treatments for the shortness of breath.  When she ambulates, her sats drops to 80s, not sure with her obesity if that is typical for her or not.  She most likely has sleep apnea.  We will also get a 2-D echo, although she had a cath back in April that was normal and she does not have any chest pain at this time.  We will get a BNP level and we will also get EKG and 2-D echo as mentioned before.  For her chronic  medical problems, we will continue her on her home medications.  Time spent with the patient during this admission is approximately 45 minutes.     Ladell Pier, M.D.     NJ/MEDQ  D:  08/26/2010  T:  08/26/2010  Job:  [id number]  Electronically Signed by Ladell Pier M.D. on 09/08/2010 11:03:25 PM

## 2010-09-22 ENCOUNTER — Encounter: Payer: Self-pay | Admitting: Internal Medicine

## 2010-09-22 ENCOUNTER — Ambulatory Visit (INDEPENDENT_AMBULATORY_CARE_PROVIDER_SITE_OTHER): Payer: Federal, State, Local not specified - PPO | Admitting: Internal Medicine

## 2010-09-22 DIAGNOSIS — J45909 Unspecified asthma, uncomplicated: Secondary | ICD-10-CM | POA: Insufficient documentation

## 2010-09-22 DIAGNOSIS — E669 Obesity, unspecified: Secondary | ICD-10-CM | POA: Insufficient documentation

## 2010-09-22 MED ORDER — BUDESONIDE-FORMOTEROL FUMARATE 160-4.5 MCG/ACT IN AERO: INHALATION_SPRAY | RESPIRATORY_TRACT | Status: DC

## 2010-09-22 NOTE — Patient Instructions (Addendum)
For any symptoms at all wheezing, coughing, chest tightnessness use symbicort 160 2 puffs every 12 hours   Work on inhaler technique:  relax and gently blow all the way out then take a nice smooth deep breath back in, triggering the inhaler at same time you start breathing in.  Hold for up to 5 seconds if you can.  Rinse and gargle with water when done   If your mouth or throat starts to bother you,   I suggest you time the inhaler to your dental care and after using the inhaler(s) brush teeth and tongue with a baking soda containing toothpaste and when you rinse this out, gargle with it first to see if this helps your mouth and throat.     GERD (REFLUX)  is an extremely common cause of respiratory symptoms, many times with no significant heartburn at all.    It can be treated with medication, but also with lifestyle changes including avoidance of late meals, excessive alcohol, smoking cessation, and avoid fatty foods, chocolate, peppermint, colas, red wine, and acidic juices such as orange juice.  NO MINT OR MENTHOL PRODUCTS SO NO COUGH DROPS  USE SUGARLESS CANDY INSTEAD (jolley ranchers or Stover's)  NO OIL BASED VITAMINS    If you are satisfied with your treatment plan let your doctor know and he/she can either refill your medications or you can return here when your prescription runs out.     If in any way you are not 100% satisfied,  please tell us.  If 100% better, tell your friends!

## 2010-09-22 NOTE — Assessment & Plan Note (Addendum)
DDX of  difficult airways managment all start with A and  include Adherence, Ace Inhibitors, Acid Reflux, Active Sinus Disease, Alpha 1 Antitripsin deficiency, Anxiety masquerading as Airways dz,  ABPA,  allergy(esp in young), Aspiration (esp in elderly), Adverse effects of DPI,  Active smokers, plus two Bs  = Bronchiectasis and Beta blocker use..and one C= CHF  Adherence is always the initial "prime suspect" and is a multilayered concern that requires a "trust but verify" approach in every patient - starting with knowing how to use medications, especially inhalers, correctly, keeping up with refills and understanding the fundamental difference between maintenance and prns vs those medications only taken for a very short course and then stopped and not refilled. The proper method of use, as well as anticipated side effects, of this metered-dose inhaler are discussed and demonstrated to the patient. Improved to 50% at best p extensive coaching.  She is relatively high risk of future asthma flares because she doesn't want to use a controlled and doesn't recognize always when she's having a flare, preferring to call it allergies or bronchitis or colds, but I did the best I could to explain that even doctors can't sort these triggers out but the key is to start treating the asthma immediately if any respiratory symptoms start (rec "get to the fire early an you won't need to hit it with as big a hose to put it out")  ? GERD> risk due to obesity (similar to mechanism of flare during first pregnancy)  See instructions for specific recommendations which were reviewed directly with the patient who was given a copy with highlighter outlining the key components.

## 2010-09-22 NOTE — Progress Notes (Signed)
Subjective:     Patient ID: Jillian Eaton, female   DOB: 04-Jan-1951, 60 y.o.   MRN: [id number]  HPI  72 yobf never smoker with asthma as child, outgrew until first pregnancy when became recurrent but can go for months at a time with no rx but since age 39 it's become an" annual thing:" where she flares and requires prednisone rx.  Had been on advair but doesn't think she needs a maintenance inhaler.  Referred to pulmonary clinic Aug 2012 p exposure to heavy dust/ heat > hosp x  6 days at [facility].    09/22/2010 post hosp   Initial pulmonary office eval / Jillian Eaton  Cc sob with activity like going to mailbox and back. Last prednisone ran out x 2 weeks.  Has not needed any inhaler since coming off prednisone though wheezed a little bit the last couple of nights.  No excess or purulent sputum.  Pt denies any significant sore throat, dysphagia, itching, sneezing,  nasal congestion or excess/ purulent secretions,  fever, chills, sweats, unintended wt loss, pleuritic or exertional cp, hempoptysis, orthopnea pnd or leg swelling.    Also denies any obvious fluctuation of symptoms with weather or environmental changes or other aggravating or alleviating factors.   .   Review of Systems  Constitutional: Positive for appetite change. Negative for fever, chills and unexpected weight change.  HENT: Positive for congestion and sore throat. Negative for ear pain, nosebleeds, rhinorrhea, sneezing, trouble swallowing, dental problem, voice change, postnasal drip and sinus pressure.   Eyes: Negative for visual disturbance.  Respiratory: Positive for cough and shortness of breath. Negative for choking.   Cardiovascular: Positive for leg swelling. Negative for chest pain.  Gastrointestinal: Positive for abdominal pain. Negative for vomiting and diarrhea.  Genitourinary: Negative for difficulty urinating.  Musculoskeletal: Positive for arthralgias.  Skin: Negative for rash.  Neurological: Positive for headaches. Negative  for tremors and syncope.  Hematological: Does not bruise/bleed easily.       Objective:   Physical Exam amb bf who appears very frustrated at times and   failed to answer a single question asked in a straightforward manner, tending to go off on tangents or answer questions with ambiguous medical terms or diagnoses and seemed somewhat perplexed  when asked the same question more than once for clarification.  Wt 417 09/22/2010 f/u ov/Jillian Eaton cc HEENT: nl dentition, turbinates, and orophanx. Nl external ear canals without cough reflex   NECK :  without JVD/Nodes/TM/ nl carotid upstrokes bilaterally   LUNGS: no acc muscle use, clear to A and P bilaterally without cough on insp or exp maneuvers   CV:  RRR  no s3 or murmur or increase in P2, no edema   ABD:  soft and nontender but massively obese.  No bruits or organomegaly, bowel sounds nl  MS:  warm without deformities, calf tenderness, cyanosis or clubbing  SKIN: warm and dry without lesions    NEURO:  alert, approp, no deficits       Assessment:         Plan:

## 2010-09-30 ENCOUNTER — Ambulatory Visit (HOSPITAL_COMMUNITY)
Admission: RE | Admit: 2010-09-30 | Discharge: 2010-09-30 | Disposition: A | Discharge status: Home / Self Care | Payer: Federal, State, Local not specified - PPO | Source: Ambulatory Visit | Attending: Gastroenterology | Admitting: Gastroenterology

## 2010-09-30 ENCOUNTER — Other Ambulatory Visit: Payer: Self-pay | Admitting: Gastroenterology

## 2010-09-30 DIAGNOSIS — K644 Residual hemorrhoidal skin tags: Secondary | ICD-10-CM | POA: Insufficient documentation

## 2010-09-30 DIAGNOSIS — Z1211 Encounter for screening for malignant neoplasm of colon: Secondary | ICD-10-CM | POA: Insufficient documentation

## 2010-09-30 DIAGNOSIS — I10 Essential (primary) hypertension: Secondary | ICD-10-CM | POA: Insufficient documentation

## 2010-09-30 DIAGNOSIS — D126 Benign neoplasm of colon, unspecified: Secondary | ICD-10-CM | POA: Insufficient documentation

## 2010-09-30 DIAGNOSIS — Z79899 Other long term (current) drug therapy: Secondary | ICD-10-CM | POA: Insufficient documentation

## 2010-09-30 DIAGNOSIS — K5730 Diverticulosis of large intestine without perforation or abscess without bleeding: Secondary | ICD-10-CM | POA: Insufficient documentation

## 2010-09-30 DIAGNOSIS — K648 Other hemorrhoids: Secondary | ICD-10-CM | POA: Insufficient documentation

## 2010-11-09 ENCOUNTER — Other Ambulatory Visit: Payer: Self-pay | Admitting: Obstetrics and Gynecology

## 2010-11-09 DIAGNOSIS — Z1231 Encounter for screening mammogram for malignant neoplasm of breast: Secondary | ICD-10-CM

## 2010-11-09 DIAGNOSIS — R1032 Left lower quadrant pain: Secondary | ICD-10-CM

## 2010-11-14 ENCOUNTER — Ambulatory Visit
Admission: RE | Admit: 2010-11-14 | Discharge: 2010-11-14 | Disposition: A | Discharge status: Home / Self Care | Payer: Federal, State, Local not specified - PPO | Source: Ambulatory Visit | Attending: Obstetrics and Gynecology | Admitting: Obstetrics and Gynecology

## 2010-11-14 DIAGNOSIS — R1032 Left lower quadrant pain: Secondary | ICD-10-CM

## 2010-11-14 MED ORDER — IOHEXOL 300 MG/ML  SOLN
125.0000 mL | Freq: Once | INTRAMUSCULAR | Status: AC | PRN
  Administered 2010-11-14: 125 mL via INTRAVENOUS

## 2010-11-21 ENCOUNTER — Ambulatory Visit: Payer: Federal, State, Local not specified - PPO

## 2010-11-21 ENCOUNTER — Ambulatory Visit
Admission: RE | Admit: 2010-11-21 | Discharge: 2010-11-21 | Disposition: A | Discharge status: Home / Self Care | Payer: Federal, State, Local not specified - PPO | Source: Ambulatory Visit | Attending: Obstetrics and Gynecology | Admitting: Obstetrics and Gynecology

## 2010-11-21 DIAGNOSIS — Z1231 Encounter for screening mammogram for malignant neoplasm of breast: Secondary | ICD-10-CM

## 2010-11-25 ENCOUNTER — Encounter (HOSPITAL_BASED_OUTPATIENT_CLINIC_OR_DEPARTMENT_OTHER): Payer: Federal, State, Local not specified - PPO | Admitting: Hematology and Oncology

## 2010-11-25 ENCOUNTER — Other Ambulatory Visit: Payer: Self-pay | Admitting: Hematology and Oncology

## 2010-11-25 DIAGNOSIS — I10 Essential (primary) hypertension: Secondary | ICD-10-CM

## 2010-11-25 DIAGNOSIS — D649 Anemia, unspecified: Secondary | ICD-10-CM

## 2010-11-25 DIAGNOSIS — E119 Type 2 diabetes mellitus without complications: Secondary | ICD-10-CM

## 2010-11-25 LAB — CBC WITH DIFFERENTIAL/PLATELET
BASO%: 0.5 % (ref 0.0–2.0)
EOS%: 2.4 % (ref 0.0–7.0)
HCT: 34.6 % — ABNORMAL LOW (ref 34.8–46.6)
LYMPH%: 26.9 % (ref 14.0–49.7)
MCH: 30.6 pg (ref 25.1–34.0)
MCHC: 32.8 g/dL (ref 31.5–36.0)
MONO#: 0.5 10*3/uL (ref 0.1–0.9)
NEUT%: 60.7 % (ref 38.4–76.8)
Platelets: 144 10*3/uL — ABNORMAL LOW (ref 145–400)

## 2010-11-25 LAB — BASIC METABOLIC PANEL
BUN: 25 mg/dL — ABNORMAL HIGH (ref 6–23)
CO2: 28 mEq/L (ref 19–32)
Calcium: 9.4 mg/dL (ref 8.4–10.5)
Creatinine, Ser: 1.09 mg/dL (ref 0.50–1.10)

## 2010-11-25 LAB — IRON AND TIBC: UIBC: 295 ug/dL (ref 125–400)

## 2010-12-09 ENCOUNTER — Encounter (HOSPITAL_BASED_OUTPATIENT_CLINIC_OR_DEPARTMENT_OTHER): Payer: Federal, State, Local not specified - PPO | Admitting: Hematology and Oncology

## 2010-12-09 DIAGNOSIS — D126 Benign neoplasm of colon, unspecified: Secondary | ICD-10-CM

## 2010-12-09 DIAGNOSIS — D649 Anemia, unspecified: Secondary | ICD-10-CM

## 2011-02-10 ENCOUNTER — Other Ambulatory Visit: Payer: Self-pay | Admitting: Internal Medicine

## 2011-02-20 ENCOUNTER — Emergency Department (HOSPITAL_COMMUNITY): Payer: Federal, State, Local not specified - PPO

## 2011-02-20 ENCOUNTER — Emergency Department (HOSPITAL_COMMUNITY)
Admission: EM | Admit: 2011-02-20 | Discharge: 2011-02-20 | Disposition: A | Discharge status: Home / Self Care | Payer: Federal, State, Local not specified - PPO | Attending: Emergency Medicine | Admitting: Emergency Medicine

## 2011-02-20 DIAGNOSIS — J3489 Other specified disorders of nose and nasal sinuses: Secondary | ICD-10-CM | POA: Insufficient documentation

## 2011-02-20 DIAGNOSIS — I10 Essential (primary) hypertension: Secondary | ICD-10-CM | POA: Insufficient documentation

## 2011-02-20 DIAGNOSIS — R05 Cough: Secondary | ICD-10-CM | POA: Insufficient documentation

## 2011-02-20 DIAGNOSIS — Z79899 Other long term (current) drug therapy: Secondary | ICD-10-CM | POA: Insufficient documentation

## 2011-02-20 DIAGNOSIS — J40 Bronchitis, not specified as acute or chronic: Secondary | ICD-10-CM | POA: Insufficient documentation

## 2011-02-20 DIAGNOSIS — R0602 Shortness of breath: Secondary | ICD-10-CM | POA: Insufficient documentation

## 2011-02-20 DIAGNOSIS — R059 Cough, unspecified: Secondary | ICD-10-CM | POA: Insufficient documentation

## 2011-02-20 DIAGNOSIS — J45909 Unspecified asthma, uncomplicated: Secondary | ICD-10-CM | POA: Insufficient documentation

## 2011-02-20 MED ORDER — PREDNISONE 20 MG PO TABS
60.0000 mg | ORAL_TABLET | Freq: Once | ORAL | Status: AC
  Administered 2011-02-20: 60 mg via ORAL
  Filled 2011-02-20: qty 3

## 2011-02-20 MED ORDER — BENZONATATE 100 MG PO CAPS: 100.0000 mg | ORAL_CAPSULE | Freq: Three times a day (TID) | ORAL | Status: AC

## 2011-02-20 MED ORDER — IPRATROPIUM BROMIDE 0.02 % IN SOLN
0.5000 mg | Freq: Once | RESPIRATORY_TRACT | Status: AC
  Administered 2011-02-20: 0.5 mg via RESPIRATORY_TRACT
  Filled 2011-02-20: qty 2.5

## 2011-02-20 MED ORDER — ALBUTEROL SULFATE (5 MG/ML) 0.5% IN NEBU
15.0000 mg | INHALATION_SOLUTION | Freq: Once | RESPIRATORY_TRACT | Status: AC
  Administered 2011-02-20: 15 mg via RESPIRATORY_TRACT
  Filled 2011-02-20 (×2): qty 3

## 2011-02-20 MED ORDER — PREDNISONE (PAK) 10 MG PO TABS: ORAL_TABLET | ORAL | Status: DC

## 2011-02-20 MED ORDER — ALBUTEROL SULFATE (5 MG/ML) 0.5% IN NEBU: 5.0000 mg | INHALATION_SOLUTION | RESPIRATORY_TRACT | Status: DC | PRN

## 2011-02-20 MED ORDER — ALBUTEROL SULFATE (5 MG/ML) 0.5% IN NEBU
5.0000 mg | INHALATION_SOLUTION | Freq: Once | RESPIRATORY_TRACT | Status: AC
  Administered 2011-02-20: 5 mg via RESPIRATORY_TRACT
  Filled 2011-02-20: qty 1

## 2011-02-20 NOTE — ED Notes (Signed)
Pt ambulatory to BR with minimal assistance.  

## 2011-02-20 NOTE — ED Notes (Signed)
Pt with 1hr neb. Pt in NAD.

## 2011-02-20 NOTE — ED Notes (Signed)
PA at bedside.

## 2011-02-20 NOTE — ED Provider Notes (Signed)
History     CSN: [id number]  Arrival date & time 02/20/11  1205   First MD Initiated Contact with Patient 02/20/11 1304     1:53 PM HPI This reports the day after Christmas began having nasal congestion, rhinorrhea and cough. States symptoms have worsened. Reports cough is nonproductive. States is causing significant shortness of breath especially with laying flat. Denies chest pain, fever, nausea, abdominal pain, diarrhea, sore throat, back pain. Reports that the last time she was hospitalized in July for bronchitis.  States she's been using her albuterol inhaler twice a day without relief.  Patient is a 60 y.o. female presenting with cough. The history is provided by the patient.  Cough This is a new problem. The current episode started more than 2 days ago. The problem occurs every few minutes. The problem has been gradually worsening. The cough is productive of sputum. There has been no fever. Associated symptoms include rhinorrhea, shortness of breath and wheezing. Pertinent negatives include no chest pain, no chills, no headaches, no sore throat and no myalgias. She has tried decongestants for the symptoms. The treatment provided no relief. She is not a smoker. Her past medical history is significant for asthma.    Past Medical History  Diagnosis Date  . Asthma   . Hypertension   . Chronic headache     Past Surgical History  Procedure Date  . Cholecystectomy 1990  . Gastroplasty 1985  . Tubal ligation 1986    Family History  Problem Relation Age of Onset  . Coronary artery disease    . Breast cancer Mother   . Breast cancer Paternal Grandmother   . Stroke    . Diabetes Father   . Diabetes Mother   . Hypertension Father   . Hypertension Mother   . Asthma Father     History  Substance Use Topics  . Smoking status: Never Smoker   . Smokeless tobacco: Never Used  . Alcohol Use: Yes     wine occ    OB History    Grav Para Term Preterm Abortions TAB SAB Ect Mult  Living                  Review of Systems  Constitutional: Negative for fever and chills.  HENT: Positive for rhinorrhea. Negative for sore throat, neck pain, neck stiffness and sinus pressure.   Respiratory: Positive for cough, shortness of breath and wheezing.   Cardiovascular: Negative for chest pain.  Gastrointestinal: Negative for nausea, vomiting, abdominal pain and diarrhea.  Musculoskeletal: Negative for myalgias.  Neurological: Negative for dizziness, numbness and headaches.  All other systems reviewed and are negative.    Allergies  Percocet  Home Medications   Current Outpatient Rx  Name Route Sig Dispense Refill  . ALBUTEROL SULFATE HFA 108 (90 BASE) MCG/ACT IN AERS Inhalation Inhale 2 puffs into the lungs every 6 (six) hours as needed.      Marland Kitchen AMLODIPINE BESYLATE 2.5 MG PO TABS Oral Take 2.5 mg by mouth daily.      . BUDESONIDE-FORMOTEROL FUMARATE 160-4.5 MCG/ACT IN AERO  Take 2 puffs first thing in am and then another 2 puffs about 12 hours later.    1 Inhaler 12  . CELECOXIB 200 MG PO CAPS Oral Take 200 mg by mouth 2 (two) times daily as needed.      . DULOXETINE HCL 60 MG PO CPEP Oral Take 60 mg by mouth daily.      Marland Kitchen HYDROCHLOROTHIAZIDE 12.5 MG  PO TABS Oral Take 12.5 mg by mouth daily.      Marland Kitchen HYDROCODONE-ACETAMINOPHEN 10-325 MG PO TABS Oral Take 1 tablet by mouth 3 (three) times daily as needed.      Marland Kitchen LOSARTAN POTASSIUM 100 MG PO TABS Oral Take 100 mg by mouth daily.      . OXYBUTYNIN CHLORIDE ER 15 MG PO TB24 Oral Take 15 mg by mouth daily.      Marland Kitchen PREGABALIN 200 MG PO CAPS Oral Take 200 mg by mouth 3 (three) times daily.      Marland Kitchen TIZANIDINE HCL 4 MG PO CAPS  1/2 to 1 tablet 4 times per day as needed     . VITAMIN D (ERGOCALCIFEROL) 50000 UNITS PO CAPS Oral Take 50,000 Units by mouth. 3 times per wk     . ZOLPIDEM TARTRATE ER 12.5 MG PO TBCR Oral Take 12.5 mg by mouth at bedtime as needed.        BP 164/81  Pulse 101  Temp(Src) 98.2 F (36.8 C) (Oral)  Resp  20  Wt 397 lb (180.078 kg)  SpO2 97%  Physical Exam  Vitals reviewed. Constitutional: She is oriented to person, place, and time. Vital signs are normal. She appears well-developed and well-nourished.  HENT:  Head: Normocephalic and atraumatic.  Right Ear: External ear normal.  Left Ear: External ear normal.  Nose: Nose normal.  Mouth/Throat: Uvula is midline and oropharynx is clear and moist. Mucous membranes are dry. No oropharyngeal exudate.  Eyes: Conjunctivae are normal. Pupils are equal, round, and reactive to light.  Neck: Normal range of motion. Neck supple.  Cardiovascular: Normal rate, regular rhythm and normal heart sounds.  Exam reveals no friction rub.   No murmur heard. Pulmonary/Chest: Effort normal. She has wheezes in the right upper field, the right lower field and the left lower field. She has no rhonchi. She has no rales. She exhibits no tenderness.  Musculoskeletal: Normal range of motion.  Neurological: She is alert and oriented to person, place, and time. Coordination normal.  Skin: Skin is warm and dry. No rash noted. No erythema. No pallor.    ED Course  Procedures    MDM   Patient reports symptoms have slightly improved. Lung exam is now benign for wheezing. Discharge home without fever all nebulizer minutes, and prescription for prednisone. Strongly advised patient to return for worsening symptoms. Also discussed weight loss would be important in helping her shortness of breath. Daughter shows a significant concern and asked me to speak to her mother about changing her sedentary lifestyle. Advised her that she should go with mother to primary care physician appointment discussed this with her. Patient was ready for discharge voices understanding       Thomasene Lot, [place] 02/20/11 1734

## 2011-02-20 NOTE — ED Notes (Signed)
Pt states "have been coughing since after Christmas, called MD & got albuterol, was given home oxygen for temporary use after a 6 day hospitalization in jULY"

## 2011-02-21 NOTE — ED Provider Notes (Signed)
Medical screening examination/treatment/procedure(s) were performed by non-physician practitioner and as supervising physician I was immediately available for consultation/collaboration.  Israella Hubert M Kathyjo Briere, MD 02/21/11 0037 

## 2011-03-01 ENCOUNTER — Telehealth: Payer: Self-pay | Admitting: *Deleted

## 2011-03-01 ENCOUNTER — Other Ambulatory Visit: Payer: Self-pay | Admitting: Internal Medicine

## 2011-03-01 ENCOUNTER — Encounter (HOSPITAL_COMMUNITY): Payer: Self-pay | Admitting: *Deleted

## 2011-03-01 ENCOUNTER — Other Ambulatory Visit: Payer: Self-pay | Admitting: *Deleted

## 2011-03-01 ENCOUNTER — Telehealth: Payer: Self-pay | Admitting: Hematology and Oncology

## 2011-03-01 ENCOUNTER — Emergency Department (HOSPITAL_COMMUNITY)
Admission: EM | Admit: 2011-03-01 | Discharge: 2011-03-02 | Disposition: A | Discharge status: Home / Self Care | Payer: Federal, State, Local not specified - PPO | Attending: Emergency Medicine | Admitting: Emergency Medicine

## 2011-03-01 DIAGNOSIS — R310 Gross hematuria: Secondary | ICD-10-CM

## 2011-03-01 DIAGNOSIS — R319 Hematuria, unspecified: Secondary | ICD-10-CM | POA: Insufficient documentation

## 2011-03-01 DIAGNOSIS — D649 Anemia, unspecified: Secondary | ICD-10-CM

## 2011-03-01 DIAGNOSIS — M549 Dorsalgia, unspecified: Secondary | ICD-10-CM

## 2011-03-01 DIAGNOSIS — I10 Essential (primary) hypertension: Secondary | ICD-10-CM | POA: Insufficient documentation

## 2011-03-01 HISTORY — DX: Hematuria, unspecified: R31.9

## 2011-03-01 LAB — URINALYSIS, ROUTINE W REFLEX MICROSCOPIC
Glucose, UA: NEGATIVE mg/dL
Ketones, ur: NEGATIVE mg/dL
Specific Gravity, Urine: 1.011 (ref 1.005–1.030)
pH: 7.5 (ref 5.0–8.0)

## 2011-03-01 LAB — URINE MICROSCOPIC-ADD ON

## 2011-03-01 NOTE — Telephone Encounter (Signed)
Spoke with pt at home and informed pt that  A scheduler will contact pt with appts  For lab 1/10  And f/u with Huntley Dec, [place] on 03/03/11.    Pt voiced understanding.

## 2011-03-01 NOTE — Telephone Encounter (Signed)
Pt called in for lb/fu appts after s/w nurse today. gv pt appts for lb 1/10 and f/i 1/11 w/SW per 1/9 elec pof.

## 2011-03-01 NOTE — ED Notes (Signed)
Pt reports hematuria since Saturday w/ accompanying lower back pain and pelvic pressure. Pt w/ hx of urinary problems - pt has informed her PCP and urologist of this.

## 2011-03-01 NOTE — Telephone Encounter (Signed)
Dr. Dalene Carrow spoke with pt's primary Dr. Allyne Gee.   Spoke with pt on cell phone and informed pt re:   Dr. Allyne Gee' office will contact pt with workup plan for pt ;  Dr. Dalene Carrow would like for pt to follow  Dr. Allyne Gee' instructions and advice.    Informed pt that if pt has urgent issues ,  Pt needs to go to ER for further evaluation.    Pt voiced understanding.

## 2011-03-02 ENCOUNTER — Other Ambulatory Visit: Payer: Federal, State, Local not specified - PPO

## 2011-03-02 ENCOUNTER — Other Ambulatory Visit: Payer: Federal, State, Local not specified - PPO | Admitting: Lab

## 2011-03-02 LAB — DIFFERENTIAL
Basophils Relative: 0 % (ref 0–1)
Eosinophils Relative: 3 % (ref 0–5)
Lymphs Abs: 2.1 10*3/uL (ref 0.7–4.0)
Monocytes Absolute: 0.7 10*3/uL (ref 0.1–1.0)
Monocytes Relative: 10 % (ref 3–12)
Neutro Abs: 3.7 10*3/uL (ref 1.7–7.7)

## 2011-03-02 LAB — BASIC METABOLIC PANEL
BUN: 19 mg/dL (ref 6–23)
CO2: 26 mEq/L (ref 19–32)
Chloride: 104 mEq/L (ref 96–112)
Creatinine, Ser: 0.86 mg/dL (ref 0.50–1.10)
Glucose, Bld: 87 mg/dL (ref 70–99)

## 2011-03-02 LAB — CBC
HCT: 38 % (ref 36.0–46.0)
MCH: 29.5 pg (ref 26.0–34.0)
MCV: 92 fL (ref 78.0–100.0)
RBC: 4.13 MIL/uL (ref 3.87–5.11)
WBC: 6.7 10*3/uL (ref 4.0–10.5)

## 2011-03-02 LAB — URINE CULTURE
Colony Count: NO GROWTH
Culture: NO GROWTH

## 2011-03-02 MED ORDER — CIPROFLOXACIN HCL 500 MG PO TABS: 500.0000 mg | ORAL_TABLET | Freq: Two times a day (BID) | ORAL | Status: AC

## 2011-03-02 MED ORDER — NAPROXEN 500 MG PO TABS: 500.0000 mg | ORAL_TABLET | Freq: Two times a day (BID) | ORAL | Status: DC

## 2011-03-02 MED ORDER — CIPROFLOXACIN HCL 500 MG PO TABS
500.0000 mg | ORAL_TABLET | Freq: Once | ORAL | Status: AC
  Administered 2011-03-02: 500 mg via ORAL
  Filled 2011-03-02: qty 1

## 2011-03-02 NOTE — ED Provider Notes (Signed)
History     CSN: [id number]  Arrival date & time 03/01/11  2005   First MD Initiated Contact with Patient 03/02/11 0206      Chief Complaint  Patient presents with  . Hematuria  . Back Pain    (Consider location/radiation/quality/duration/timing/severity/associated sxs/prior treatment) HPI Comments: [place]-year-old female with a history of hematuria over the last year. Also with history of hypertension, chronic headaches and some functional urinary incontinence. She has had blood in her urine for the last 5 days. It is gross blood, associated with pressure in her lower abdomen and lower back. It is not associated with fevers, chills, nausea or vomiting. She has been seen by her urologist, her hematologist and her family Dr. and no one has an answer for her recurrent hematuria.  Usually her symptoms only last for half a day but it lasted for 5 days. She has no source of her chronic anemia despite a very thorough workup. She had a cystoscopy performed approximately one year ago and was told there was no signs of bladder cancer.  Patient is a 61 y.o. female presenting with hematuria and back pain. The history is provided by the patient and the spouse.  Hematuria  Back Pain     Past Medical History  Diagnosis Date  . Asthma   . Hypertension   . Chronic headache   . Urinary incontinence, functional   . Hematuria - cause not known     Past Surgical History  Procedure Date  . Cholecystectomy 1990  . Gastroplasty 1985  . Tubal ligation 1986    Family History  Problem Relation Age of Onset  . Coronary artery disease    . Breast cancer Mother   . Breast cancer Paternal Grandmother   . Stroke    . Diabetes Father   . Diabetes Mother   . Hypertension Father   . Hypertension Mother   . Asthma Father     History  Substance Use Topics  . Smoking status: Never Smoker   . Smokeless tobacco: Never Used  . Alcohol Use: Yes     wine occ    OB History    Grav Para Term Preterm  Abortions TAB SAB Ect Mult Living                  Review of Systems  Genitourinary: Positive for hematuria.  Musculoskeletal: Positive for back pain.  All other systems reviewed and are negative.    Allergies  Percocet  Home Medications   Current Outpatient Rx  Name Route Sig Dispense Refill  . ALBUTEROL SULFATE HFA 108 (90 BASE) MCG/ACT IN AERS Inhalation Inhale 2 puffs into the lungs every 6 (six) hours as needed. For shortness of breath.    . ALBUTEROL SULFATE (5 MG/ML) 0.5% IN NEBU Nebulization Take 1 mL (5 mg total) by nebulization every 4 (four) hours as needed for wheezing or shortness of breath. 20 mL 2  . AMLODIPINE BESYLATE 2.5 MG PO TABS Oral Take 2.5 mg by mouth every morning.     . BUDESONIDE-FORMOTEROL FUMARATE 160-4.5 MCG/ACT IN AERO  2 puffs 2 (two) times daily.      . CELECOXIB 200 MG PO CAPS Oral Take 200 mg by mouth 2 (two) times daily as needed. For pain.    . DULOXETINE HCL 60 MG PO CPEP Oral Take 60 mg by mouth every evening.     Marland Kitchen ESZOPICLONE 3 MG PO TABS Oral Take 3 mg by mouth at bedtime. Take immediately  before bedtime     . HYDROCODONE-ACETAMINOPHEN 10-325 MG PO TABS Oral Take 1 tablet by mouth every 6 (six) hours as needed. For pain.    Marland Kitchen LIDOCAINE 5 % EX PTCH Transdermal Place 1-2 patches onto the skin daily as needed. Applies to back.     Marland Kitchen LOSARTAN POTASSIUM 100 MG PO TABS Oral Take 100 mg by mouth every morning.     . OXYBUTYNIN CHLORIDE ER 15 MG PO TB24 Oral Take 15 mg by mouth every morning.     Marland Kitchen PREGABALIN 200 MG PO CAPS Oral Take 200 mg by mouth 3 (three) times daily.      Marland Kitchen TIZANIDINE HCL 4 MG PO CAPS  2-4 mg 4 (four) times daily as needed. For spasms.    . TOPIRAMATE 50 MG PO TABS Oral Take 50 mg by mouth 2 (two) times daily.      Marland Kitchen VITAMIN D (ERGOCALCIFEROL) 50000 UNITS PO CAPS Oral Take 50,000 Units by mouth 2 (two) times a week.     Marland Kitchen CIPROFLOXACIN HCL 500 MG PO TABS Oral Take 1 tablet (500 mg total) by mouth every 12 (twelve) hours. 20  tablet 0  . NAPROXEN 500 MG PO TABS Oral Take 1 tablet (500 mg total) by mouth 2 (two) times daily with a meal. 30 tablet 0    BP 122/64  Pulse 88  Temp(Src) 97.3 F (36.3 C) (Oral)  Resp 20  SpO2 100%  Physical Exam  Nursing note and vitals reviewed. Constitutional: She appears well-developed and well-nourished. No distress.       Morbidly obese  HENT:  Head: Normocephalic and atraumatic.  Mouth/Throat: Oropharynx is clear and moist. No oropharyngeal exudate.  Eyes: Conjunctivae and EOM are normal. Pupils are equal, round, and reactive to light. Right eye exhibits no discharge. Left eye exhibits no discharge. No scleral icterus.  Neck: Normal range of motion. Neck supple. No JVD present. No thyromegaly present.  Cardiovascular: Normal rate, regular rhythm, normal heart sounds and intact distal pulses.  Exam reveals no gallop and no friction rub.   No murmur heard. Pulmonary/Chest: Effort normal and breath sounds normal. No respiratory distress. She has no wheezes. She has no rales.  Abdominal: Soft. Bowel sounds are normal. She exhibits no distension and no mass. There is no tenderness.  Musculoskeletal: Normal range of motion. She exhibits no edema and no tenderness.  Lymphadenopathy:    She has no cervical adenopathy.  Neurological: She is alert. Coordination normal.  Skin: Skin is warm and dry. No rash noted. No erythema.  Psychiatric: She has a normal mood and affect. Her behavior is normal.    ED Course  Procedures (including critical care time)  Labs Reviewed  URINALYSIS, ROUTINE W REFLEX MICROSCOPIC - Abnormal; Notable for the following:    Color, Urine RED (*) BIOCHEMICALS MAY BE AFFECTED BY COLOR   APPearance CLOUDY (*)    Hgb urine dipstick LARGE (*)    Leukocytes, UA TRACE (*)    All other components within normal limits  BASIC METABOLIC PANEL - Abnormal; Notable for the following:    GFR calc non Af Amer 73 (*)    GFR calc Af Amer 85 (*)    All other  components within normal limits  URINE MICROSCOPIC-ADD ON  CBC  DIFFERENTIAL  URINE CULTURE  PROTIME-INR  APTT   No results found.   1. Hematuria   2. Back pain   3. Hypertension       MDM  No other  findings on physical exam, urinalysis shows significant hematuria but no bacteria seen, very few white blood cells seen. Will culture the urine, check CBC for anemia or thrombus and ischemia, coagulation studies and basic metabolic panel to check renal function.   No anemia, no thrombocytopenia, normal metabolic panel and hematuria. Have given ciprofloxacin prior to discharge, urine culture and followup with urology. She appears stable for discharge       Vida Roller, MD 03/02/11 [phone]

## 2011-03-02 NOTE — ED Notes (Addendum)
Patient stable upon discharge to home.   Patient discharged via with husband in wheelchair not by ems

## 2011-03-02 NOTE — ED Notes (Signed)
Attempted to start IV x 2, unsuccessful. 

## 2011-03-03 ENCOUNTER — Ambulatory Visit: Payer: Federal, State, Local not specified - PPO | Admitting: Physician Assistant

## 2011-03-06 ENCOUNTER — Ambulatory Visit
Admission: RE | Admit: 2011-03-06 | Discharge: 2011-03-06 | Disposition: A | Discharge status: Home / Self Care | Payer: Federal, State, Local not specified - PPO | Source: Ambulatory Visit | Attending: Internal Medicine | Admitting: Internal Medicine

## 2011-03-06 DIAGNOSIS — R310 Gross hematuria: Secondary | ICD-10-CM

## 2011-03-09 ENCOUNTER — Other Ambulatory Visit (HOSPITAL_BASED_OUTPATIENT_CLINIC_OR_DEPARTMENT_OTHER): Payer: Federal, State, Local not specified - PPO | Admitting: Lab

## 2011-03-09 DIAGNOSIS — R319 Hematuria, unspecified: Secondary | ICD-10-CM

## 2011-03-09 DIAGNOSIS — D649 Anemia, unspecified: Secondary | ICD-10-CM

## 2011-03-09 LAB — CBC WITH DIFFERENTIAL/PLATELET
EOS%: 2.5 % (ref 0.0–7.0)
Eosinophils Absolute: 0.1 10*3/uL (ref 0.0–0.5)
MCV: 92.8 fL (ref 79.5–101.0)
MONO%: 10.5 % (ref 0.0–14.0)
NEUT#: 2.6 10*3/uL (ref 1.5–6.5)
RBC: 3.8 10*6/uL (ref 3.70–5.45)
RDW: 15.7 % — ABNORMAL HIGH (ref 11.2–14.5)
lymph#: 1.4 10*3/uL (ref 0.9–3.3)

## 2011-03-09 LAB — IRON AND TIBC
%SAT: 22 % (ref 20–55)
Iron: 77 ug/dL (ref 42–145)
UIBC: 266 ug/dL (ref 125–400)

## 2011-03-09 LAB — BASIC METABOLIC PANEL
Glucose, Bld: 94 mg/dL (ref 70–99)
Potassium: 4.1 mEq/L (ref 3.5–5.3)
Sodium: 143 mEq/L (ref 135–145)

## 2011-03-09 LAB — FERRITIN: Ferritin: 247 ng/mL (ref 10–291)

## 2011-03-10 ENCOUNTER — Ambulatory Visit: Payer: Federal, State, Local not specified - PPO | Admitting: Physician Assistant

## 2011-03-11 ENCOUNTER — Telehealth: Payer: Self-pay | Admitting: Hematology and Oncology

## 2011-03-11 NOTE — Telephone Encounter (Signed)
Talked to pt, trying to give her appt for February 19th lab and MD visit for 2/22, pt informed me that she was supposed to be seen by Durenda Age 59/18 and did not show due to snow, called Thu left a message, regarding appt.

## 2011-03-14 ENCOUNTER — Other Ambulatory Visit: Payer: Self-pay | Admitting: *Deleted

## 2011-03-15 ENCOUNTER — Telehealth: Payer: Self-pay | Admitting: Hematology and Oncology

## 2011-03-15 NOTE — Telephone Encounter (Signed)
S/w pt today re appt for 2/5 @ 10:15 am. Date/RJ per 1/22 pof

## 2011-03-28 ENCOUNTER — Ambulatory Visit (HOSPITAL_BASED_OUTPATIENT_CLINIC_OR_DEPARTMENT_OTHER): Payer: Federal, State, Local not specified - PPO | Admitting: Physician Assistant

## 2011-03-28 VITALS — BP 144/85 | HR 75 | Temp 96.7°F | Ht 67.0 in | Wt 389.4 lb

## 2011-03-28 DIAGNOSIS — R319 Hematuria, unspecified: Secondary | ICD-10-CM

## 2011-03-28 DIAGNOSIS — D369 Benign neoplasm, unspecified site: Secondary | ICD-10-CM

## 2011-03-28 DIAGNOSIS — D649 Anemia, unspecified: Secondary | ICD-10-CM

## 2011-03-28 DIAGNOSIS — D638 Anemia in other chronic diseases classified elsewhere: Secondary | ICD-10-CM | POA: Insufficient documentation

## 2011-03-28 NOTE — Progress Notes (Signed)
CC:   [name], M.D. [name], MD [name], M.D. Wynona Canes, M.D.  IDENTIFYING STATEMENT:  Jillian Eaton is a 61 year old black female with history of anemia, who presents for followup.  INTERIM HISTORY:  Jillian Eaton reports since her last clinic visit in October 2012 that she has switched primary care physicians and now is seen by Dr. [name].  She states she had an evaluation on yesterday.  She also states that she now has a new urologist, Dr. Nils Pyle.  She was last evaluated this past Thursday and has another followup appointment today where she is scheduled for a CT scan of the kidneys due to issues with hematuria which has been intermittent since January 5th of this year.  Currently she reports some fatigue but states this has not affected her ability to complete ADLs.  She has had no fevers, chills, or night sweats.  She does have dyspnea at times with exertion.  She also continues to report generalized discomfort with myalgias and arthralgias, which she attributes to fibromyalgia, and she does remain on pain medication for this.  She has normal appetite and has not had any problems with nausea, vomiting, constipation or diarrhea.  No issues with increased swelling of extremities.  She does still report chronic lower extremity edema.  CURRENT MEDICATIONS:  Reviewed and recorded.  PHYSICAL EXAMINATION:  Temperature is 96.7, heart rate 75, respirations 22, blood pressure 144/85, weight 389.4 pounds.  General:  This is a well-developed, well-nourished black female in no acute distress. HEENT:  Sclerae are nonicteric.  There is no oral thrush or mucositis. Skin:  Without rashes or lesions.  Lymph:  No cervical, supraclavicular, axillary or inguinal lymphadenopathy.  Cardiac:  Reveals regular rate and rhythm without murmurs or gallops.  Peripheral pulses are 2+. Chest:  Lungs are clear to auscultation.  Abdomen:  Positive  bowel sounds.  Soft, nontender, nondistended.  There is no organomegaly. Extremities reveal dependent lower extremity edema without cyanosis or calf tenderness.  Neurologic:  Alert and oriented x3.  Strength, sensation, and coordination all grossly intact.  LABORATORY DATA:  Laboratory data from March 09, 2011:  CBC with differential reveals white blood count of 4.6, hemoglobin 11.6, hematocrit 35.3, platelets of 178, ANC of 2.6 and MCV of 92.8. Chemistries reveal a sodium of 143, potassium 4.1, chloride 108, BUN 27, creatinine 0.95, glucose of 94, and calcium of 9.1.  Iron studies reveal a percent saturation of 22, ferritin of 247, iron of 77, TIBC of 343 and UIBC of 266.  Vitamin B12 level of greater than 2000.  IMPRESSION/PLAN: 1. Jillian Eaton is a 61 year old black female with anemia.  She     also has history of bypass surgery x2.  She has received     intravenous iron in the form of INFeD.  Last infusion was June 06, 2010, with normalization of iron level since that time.  She also     is up to date on colonoscopy with last one being September 30, 2010,     under the care of Dr. Elnoria Howard, which revealed tubular adenomas with no     high-grade dysplasia or malignancy identified.  She will be due for     repeat colonoscopy in August of 2015. 2. The patient states that she does receive monthly vitamin B12     injections with last injection March 08, 2011.  She is advised     that her B12 level is above  normal limits and she will follow up     with her primary physician for this. 3. The patient reports issues with hematuria since January 2013 and is     under the care of a urologist,  Dr. Nils Pyle, with next     followup today, at which time she will have a CT scan of the     kidneys. 4. The patient will be scheduled for followup with Dr. Dalene Carrow in 4     months' time.  A few days before this we will reassess CBC with     differential, BMET, ferritin, iron, IBC and B12  level.  Patient is     advised to call in the interim if any questions or problems.    ______________________________ Michail Sermon, MSN, ANP, BC RH/MEDQ  D:  03/28/2011  T:  03/28/2011  Job:  [id number]

## 2011-03-28 NOTE — Progress Notes (Signed)
This office note has been dictated.

## 2011-04-11 ENCOUNTER — Other Ambulatory Visit: Payer: Federal, State, Local not specified - PPO | Admitting: Lab

## 2011-04-14 ENCOUNTER — Ambulatory Visit: Payer: Federal, State, Local not specified - PPO | Admitting: Hematology and Oncology

## 2011-07-21 ENCOUNTER — Other Ambulatory Visit (HOSPITAL_BASED_OUTPATIENT_CLINIC_OR_DEPARTMENT_OTHER): Payer: Federal, State, Local not specified - PPO | Admitting: Lab

## 2011-07-21 DIAGNOSIS — D649 Anemia, unspecified: Secondary | ICD-10-CM

## 2011-07-21 LAB — CBC WITH DIFFERENTIAL/PLATELET
EOS%: 4.5 % (ref 0.0–7.0)
MCH: 29.9 pg (ref 25.1–34.0)
MCV: 93.1 fL (ref 79.5–101.0)
MONO%: 10.5 % (ref 0.0–14.0)
RBC: 3.57 10*6/uL — ABNORMAL LOW (ref 3.70–5.45)
RDW: 15.3 % — ABNORMAL HIGH (ref 11.2–14.5)
nRBC: 0 % (ref 0–0)

## 2011-07-22 LAB — BASIC METABOLIC PANEL
Calcium: 9.3 mg/dL (ref 8.4–10.5)
Glucose, Bld: 55 mg/dL — ABNORMAL LOW (ref 70–99)
Sodium: 144 mEq/L (ref 135–145)

## 2011-07-22 LAB — IRON AND TIBC
%SAT: 20 % (ref 20–55)
Iron: 78 ug/dL (ref 42–145)
TIBC: 390 ug/dL (ref 250–470)

## 2011-07-26 ENCOUNTER — Other Ambulatory Visit: Payer: Federal, State, Local not specified - PPO | Admitting: Lab

## 2011-07-26 ENCOUNTER — Ambulatory Visit: Payer: Federal, State, Local not specified - PPO | Admitting: Hematology and Oncology

## 2011-07-27 ENCOUNTER — Other Ambulatory Visit: Payer: Self-pay | Admitting: Hematology and Oncology

## 2011-07-27 ENCOUNTER — Telehealth: Payer: Self-pay | Admitting: Hematology and Oncology

## 2011-07-27 DIAGNOSIS — D539 Nutritional anemia, unspecified: Secondary | ICD-10-CM

## 2011-07-27 NOTE — Telephone Encounter (Signed)
Pt called yesterday wanting to r/s her appt. Pt made aware that message would be sent to LO for new d/t. Per pt calls are going to her home phone regarding her appts and she does not get those messages. Pt forwarded to Renee to change demographic information. Message sent to LO for new d/t.

## 2011-07-28 ENCOUNTER — Telehealth: Payer: Self-pay | Admitting: Hematology and Oncology

## 2011-07-28 NOTE — Telephone Encounter (Signed)
S/w pt re new appts for 6/21 and 6/26. Per LO pt was informed that if she does not keep the appts she will not be rescheduled again.

## 2011-08-11 ENCOUNTER — Other Ambulatory Visit (HOSPITAL_BASED_OUTPATIENT_CLINIC_OR_DEPARTMENT_OTHER): Payer: Federal, State, Local not specified - PPO | Admitting: Lab

## 2011-08-11 DIAGNOSIS — D649 Anemia, unspecified: Secondary | ICD-10-CM

## 2011-08-11 DIAGNOSIS — D539 Nutritional anemia, unspecified: Secondary | ICD-10-CM

## 2011-08-11 LAB — IRON AND TIBC
%SAT: 18 % — ABNORMAL LOW (ref 20–55)
TIBC: 368 ug/dL (ref 250–470)

## 2011-08-11 LAB — CBC WITH DIFFERENTIAL/PLATELET
Eosinophils Absolute: 0.1 10*3/uL (ref 0.0–0.5)
LYMPH%: 24.8 % (ref 14.0–49.7)
MCV: 93.4 fL (ref 79.5–101.0)
MONO%: 8.9 % (ref 0.0–14.0)
NEUT#: 4.1 10*3/uL (ref 1.5–6.5)
Platelets: 149 10*3/uL (ref 145–400)
RBC: 3.42 10*6/uL — ABNORMAL LOW (ref 3.70–5.45)

## 2011-08-11 LAB — FERRITIN: Ferritin: 98 ng/mL (ref 10–291)

## 2011-08-16 ENCOUNTER — Telehealth: Payer: Self-pay | Admitting: *Deleted

## 2011-08-16 ENCOUNTER — Ambulatory Visit (HOSPITAL_BASED_OUTPATIENT_CLINIC_OR_DEPARTMENT_OTHER): Payer: Federal, State, Local not specified - PPO | Admitting: Hematology and Oncology

## 2011-08-16 ENCOUNTER — Encounter: Payer: Self-pay | Admitting: Hematology and Oncology

## 2011-08-16 ENCOUNTER — Telehealth: Payer: Self-pay | Admitting: Hematology and Oncology

## 2011-08-16 VITALS — BP 130/81 | HR 85 | Temp 97.9°F | Ht 67.0 in | Wt >= 6400 oz

## 2011-08-16 DIAGNOSIS — D509 Iron deficiency anemia, unspecified: Secondary | ICD-10-CM

## 2011-08-16 DIAGNOSIS — R0602 Shortness of breath: Secondary | ICD-10-CM

## 2011-08-16 DIAGNOSIS — D539 Nutritional anemia, unspecified: Secondary | ICD-10-CM

## 2011-08-16 NOTE — Progress Notes (Signed)
This office note has been dictated.

## 2011-08-16 NOTE — Patient Instructions (Signed)
Lam Bjorklund Waldrip  [id number]  [facility] Discharge Instructions  RECOMMENDATIONS MADE BY THE CONSULTANT AND ANY TEST RESULTS WILL BE SENT TO YOUR REFERRING DOCTOR.   EXAM FINDINGS BY MD TODAY AND SIGNS AND SYMPTOMS TO REPORT TO CLINIC OR PRIMARY MD:   Your current list of medications are: Current Outpatient Prescriptions  Medication Sig Dispense Refill  . albuterol (PROAIR HFA) 108 (90 BASE) MCG/ACT inhaler Inhale 2 puffs into the lungs every 6 (six) hours as needed. For shortness of breath.      Marland Kitchen albuterol (PROVENTIL) (5 MG/ML) 0.5% nebulizer solution Take 1 mL (5 mg total) by nebulization every 4 (four) hours as needed for wheezing or shortness of breath.  20 mL  2  . amLODipine (NORVASC) 2.5 MG tablet Take 2.5 mg by mouth every morning.       . budesonide-formoterol (SYMBICORT) 160-4.5 MCG/ACT inhaler 2 puffs 2 (two) times daily.        . DULoxetine (CYMBALTA) 60 MG capsule Take 60 mg by mouth every evening.       Marland Kitchen HYDROcodone-acetaminophen (NORCO) 10-325 MG per tablet Take 1 tablet by mouth every 6 (six) hours as needed. For pain.      Marland Kitchen lidocaine (LIDODERM) 5 % Place 1-2 patches onto the skin daily as needed. Applies to back.       Marland Kitchen losartan (COZAAR) 100 MG tablet Take 100 mg by mouth every morning.       . meloxicam (MOBIC) 7.5 MG tablet Take 7.5 mg by mouth 2 (two) times daily.      . mirabegron ER (MYRBETRIQ) 50 MG TB24 Take 50 mg by mouth daily.      . pregabalin (LYRICA) 200 MG capsule Take 200 mg by mouth 3 (three) times daily.        Marland Kitchen tiZANidine (ZANAFLEX) 4 MG capsule 2-4 mg 4 (four) times daily as needed. For spasms.      . Vitamin D, Ergocalciferol, (DRISDOL) 50000 UNITS CAPS Take 50,000 Units by mouth 2 (two) times a week.          INSTRUCTIONS GIVEN AND DISCUSSED:   SPECIAL INSTRUCTIONS/FOLLOW-UP:  See above.  I acknowledge that I have been informed and understand all the instructions given to me and received a copy. I do not have any more  questions at this time, but understand that I may call the [facility] at [phone] during business hours should I have any further questions or need assistance in obtaining follow-up care.

## 2011-08-16 NOTE — Telephone Encounter (Signed)
Per staff message I have schedueld appt. JMW

## 2011-08-16 NOTE — Telephone Encounter (Signed)
l/m with 6/28 iron appt

## 2011-08-16 NOTE — Progress Notes (Signed)
CC:   [name], M.D. Wynona Canes, M.D. [name], M.D. [name], MD  IDENTIFYING STATEMENT:  Patient is a 61 year old woman with history of iron-deficiency anemia, who  presents for followup.  INTERVAL HISTORY:  The patient was last seen here at the [facility] for anemia 6 months ago.  She reports that she was diagnosed with a kidney stone and underwent removal in March 2013.  She has not noted hematuria since that time.  She is not on oral iron.  She last received IV iron in the form of INFeD a year ago  for ferritin of 17.  She notes fatigue and shortness of breath on exertion.  She denies lightheadedness.  Lab results are noted to below.  MEDICATIONS:  Reviewed and updated.  ALLERGIES:  Percocet.  PAST MEDICAL HISTORY/FAMILY HISTORY/SOCIAL HISTORY:  Unchanged.  REVIEW OF SYSTEMS:  As above and rest review of systems negative.  PHYSICAL EXAMINATION:  Patient is alert and oriented x3.  Vitals:  Pulse 85, blood pressure 130/81, temperature 97.9, respirations 22, weight 410.3.  HEENT:  Head is atraumatic, normocephalic.  Sclerae anicteric. Mouth moist.  Chest: Appears  clear.  Abdomen:  Obese, soft. Extremities:  Plus edema.  LAB DATA:  08/11/2011:  White cell count 6.4, hemoglobin 10.1, hematocrit 31.9, platelets 149.  Ferritin 98 (247), folate 13.8, iron 65, TIBC 368, saturation 18%.  IMPRESSION AND PLAN:  Ms. Bonet is a 61 year old woman with history of iron-deficiency anemia, likely secondary to history of hematuria.  Her ferritin levels are trending downward but not that low, but I will go ahead and give her IV iron in the form of Feraheme this upcoming week. She has had a history of bypass surgery x2.  Complains of shortness of breath.  I have asked that she follow up with her primary care physician as soon as possible or if the symptoms get worse,  to present to emergency room.  Regarding her anemia, I will have her follow up after a her  Feraheme in 6-8 weeks time.  She will obtain blood work before that visit.  Of note, her last GI for workup that included a colonoscopy was in August 2012.  She receives monthly B12 injections through her PCP's office.    ______________________________ Laurice Record, M.D. LIO/MEDQ  D:  08/16/2011  T:  08/16/2011  Job:  [id number]

## 2011-08-16 NOTE — Telephone Encounter (Signed)
Pt appts made and printed for pt,pt aware that i will call with iron time on 6/28   aom

## 2011-08-18 ENCOUNTER — Ambulatory Visit (HOSPITAL_BASED_OUTPATIENT_CLINIC_OR_DEPARTMENT_OTHER): Payer: Federal, State, Local not specified - PPO

## 2011-08-18 VITALS — BP 147/89 | HR 82 | Temp 97.5°F

## 2011-08-18 DIAGNOSIS — D649 Anemia, unspecified: Secondary | ICD-10-CM

## 2011-08-18 MED ORDER — SODIUM CHLORIDE 0.9 % IV SOLN
Freq: Once | INTRAVENOUS | Status: AC
  Administered 2011-08-18: 10:00:00 via INTRAVENOUS

## 2011-08-18 MED ORDER — SODIUM CHLORIDE 0.9 % IV SOLN
1020.0000 mg | Freq: Once | INTRAVENOUS | Status: AC
  Administered 2011-08-18: 1020 mg via INTRAVENOUS
  Filled 2011-08-18: qty 34

## 2011-08-18 NOTE — Patient Instructions (Addendum)
[facility] Discharge Instructions for Patients Receiving Chemotherapy  Today you received the following chemotherapy agents Feraheme  To help prevent nausea and vomiting after your treatment, we encourage you to take your nausea medication Begin taking it at 7 pm and take it as often as prescribed for the next 24 to 72 hours.   If you develop nausea and vomiting that is not controlled by your nausea medication, call the clinic. If it is after clinic hours your family physician or the after hours number for the clinic or go to the Emergency Department.   BELOW ARE SYMPTOMS THAT SHOULD BE REPORTED IMMEDIATELY:  *FEVER GREATER THAN 100.5 F  *CHILLS WITH OR WITHOUT FEVER  NAUSEA AND VOMITING THAT IS NOT CONTROLLED WITH YOUR NAUSEA MEDICATION  *UNUSUAL SHORTNESS OF BREATH  *UNUSUAL BRUISING OR BLEEDING  TENDERNESS IN MOUTH AND THROAT WITH OR WITHOUT PRESENCE OF ULCERS  *URINARY PROBLEMS  *BOWEL PROBLEMS  UNUSUAL RASH Items with * indicate a potential emergency and should be followed up as soon as possible.  One of the nurses will contact you 24 hours after your treatment. Please let the nurse know about any problems that you may have experienced. Feel free to call the clinic you have any questions or concerns. The clinic phone number is [phone].   I have been informed and understand all the instructions given to me. I know to contact the clinic, my physician, or go to the Emergency Department if any problems should occur. I do not have any questions at this time, but understand that I may call the clinic during office hours   should I have any questions or need assistance in obtaining follow up care.    __________________________________________  _____________  __________ Signature of Patient or Authorized Representative            Date                   Time    __________________________________________ Nurse's Signature    

## 2011-09-22 ENCOUNTER — Other Ambulatory Visit (HOSPITAL_BASED_OUTPATIENT_CLINIC_OR_DEPARTMENT_OTHER): Payer: Federal, State, Local not specified - PPO | Admitting: Lab

## 2011-09-22 DIAGNOSIS — D539 Nutritional anemia, unspecified: Secondary | ICD-10-CM

## 2011-09-22 LAB — FERRITIN: Ferritin: 227 ng/mL (ref 10–291)

## 2011-09-22 LAB — CBC WITH DIFFERENTIAL/PLATELET
Basophils Absolute: 0 10*3/uL (ref 0.0–0.1)
Eosinophils Absolute: 0.2 10*3/uL (ref 0.0–0.5)
HGB: 10.7 g/dL — ABNORMAL LOW (ref 11.6–15.9)
MCV: 94.7 fL (ref 79.5–101.0)
MONO#: 0.5 10*3/uL (ref 0.1–0.9)
NEUT#: 2.8 10*3/uL (ref 1.5–6.5)
RDW: 15 % — ABNORMAL HIGH (ref 11.2–14.5)
WBC: 5 10*3/uL (ref 3.9–10.3)
lymph#: 1.4 10*3/uL (ref 0.9–3.3)

## 2011-09-22 LAB — BASIC METABOLIC PANEL
BUN: 26 mg/dL — ABNORMAL HIGH (ref 6–23)
CO2: 28 mEq/L (ref 19–32)
Chloride: 106 mEq/L (ref 96–112)
Glucose, Bld: 76 mg/dL (ref 70–99)
Potassium: 4.6 mEq/L (ref 3.5–5.3)

## 2011-09-22 LAB — IRON AND TIBC
%SAT: 22 % (ref 20–55)
TIBC: 366 ug/dL (ref 250–470)

## 2011-09-26 ENCOUNTER — Ambulatory Visit (HOSPITAL_BASED_OUTPATIENT_CLINIC_OR_DEPARTMENT_OTHER): Payer: Federal, State, Local not specified - PPO | Admitting: Hematology and Oncology

## 2011-09-26 ENCOUNTER — Encounter: Payer: Self-pay | Admitting: Hematology and Oncology

## 2011-09-26 ENCOUNTER — Telehealth: Payer: Self-pay | Admitting: *Deleted

## 2011-09-26 VITALS — BP 126/72 | HR 72 | Temp 97.1°F | Resp 22 | Ht 67.0 in | Wt >= 6400 oz

## 2011-09-26 DIAGNOSIS — D539 Nutritional anemia, unspecified: Secondary | ICD-10-CM

## 2011-09-26 DIAGNOSIS — D509 Iron deficiency anemia, unspecified: Secondary | ICD-10-CM

## 2011-09-26 NOTE — Patient Instructions (Signed)
Jillian Eaton  [id number]  [facility] Discharge Instructions  RECOMMENDATIONS MADE BY THE CONSULTANT AND ANY TEST RESULTS WILL BE SENT TO YOUR REFERRING DOCTOR.   EXAM FINDINGS BY MD TODAY AND SIGNS AND SYMPTOMS TO REPORT TO CLINIC OR PRIMARY MD:   Your current list of medications are: Current Outpatient Prescriptions  Medication Sig Dispense Refill  . albuterol (PROAIR HFA) 108 (90 BASE) MCG/ACT inhaler Inhale 2 puffs into the lungs every 6 (six) hours as needed. For shortness of breath.      . albuterol (PROVENTIL) (5 MG/ML) 0.5% nebulizer solution Take 1 mL (5 mg total) by nebulization every 4 (four) hours as needed for wheezing or shortness of breath.  20 mL  2  . amLODipine (NORVASC) 2.5 MG tablet Take 2.5 mg by mouth every morning.       . budesonide-formoterol (SYMBICORT) 160-4.5 MCG/ACT inhaler 2 puffs 2 (two) times daily.        . DULoxetine (CYMBALTA) 60 MG capsule Take 60 mg by mouth every evening.       . HYDROcodone-acetaminophen (NORCO) 10-325 MG per tablet Take 1 tablet by mouth every 6 (six) hours as needed. For pain.      . lidocaine (LIDODERM) 5 % Place 1-2 patches onto the skin daily as needed. Applies to back.       . losartan (COZAAR) 100 MG tablet Take 100 mg by mouth every morning.       . meloxicam (MOBIC) 7.5 MG tablet Take 7.5 mg by mouth 2 (two) times daily.      . mirabegron ER (MYRBETRIQ) 50 MG TB24 Take 50 mg by mouth daily.      . pregabalin (LYRICA) 200 MG capsule Take 200 mg by mouth 3 (three) times daily.        . tiZANidine (ZANAFLEX) 4 MG capsule 2-4 mg 4 (four) times daily as needed. For spasms.      . Vitamin D, Ergocalciferol, (DRISDOL) 50000 UNITS CAPS Take 50,000 Units by mouth 2 (two) times a week.          INSTRUCTIONS GIVEN AND DISCUSSED:   SPECIAL INSTRUCTIONS/FOLLOW-UP:  See above.  I acknowledge that I have been informed and understand all the instructions given to me and received a copy. I do not have any more  questions at this time, but understand that I may call the [facility] at [phone] during business hours should I have any further questions or need assistance in obtaining follow-up care.      

## 2011-09-26 NOTE — Progress Notes (Signed)
This office note has been dictated.

## 2011-09-26 NOTE — Progress Notes (Signed)
CC:   Jillian Eaton, M.D. [name], M.D. [name], M.D. [name], MD  IDENTIFYING STATEMENT:  A 61 year old woman with anemia who presents for followup.  INTERVAL HISTORY:  The patient was seen here 6 weeks ago and received Feraheme for ferritin of 98.  At the time, she was significantly symptomatic.  She had noted increasing shortness of breath on exertion. She also tells me that she has consulted with her primary care physician, Dr. Alwyn Ren.  She has also seen her pulmonologist.  He suggested she increase Symbicort.  MEDICATIONS:  Reviewed and updated.  ALLERGIES:  Percocet.  REVIEW OF SYSTEMS:  Continues to have increasing fatigue despite receiving Feraheme.  PHYSICAL EXAMINATION:  General:  Patient alert and oriented x3.  Vitals: Pulse 72, blood pressure 126/72, temperature 97.1, respirations 22, weight 415.7 pounds.  HEENT:  Head is atraumatic, normocephalic. Sclerae anicteric.  Mouth moist.  Chest:  Clear.  CVS:  Unremarkable. Abdomen:  Soft, nontender.  Bowel sounds present.  Extremities:  No edema, calf tenderness.  LABORATORY DATA:  09/22/2011 white cell count 5, hemoglobin 10.7, hematocrit 33.3, platelets 141.  Sodium 144, potassium 4.6, chloride 106, CO2 of 28, BUN 25, creatinine 0.95, glucose 76, calcium 9.4. Folate 10.8, iron 81, TIBC 366, saturation 22%, ferritin 227.  IMPRESSION AND PLAN:  Jillian Eaton is a 61 year old woman with history of iron-deficiency anemia.  She has had a history of hematuria, which was felt secondary to chronic renal stones.  Her ferritin levels have increased.  Hemoglobin stills remains around 10.  She may have an underlying anemia of chronic disease.  She also remains symptomatic and of note, she has had bypass surgery times twice in the past.  She is having this evaluated by her primary care physician.  From my standpoint, she has had excellent increments in iron stores with recent Feraheme.  She follows up  in 6 months' time.  Of note, she continues to receive B12 injections through her primary care physician's office.    ______________________________ Laurice Record, M.D. LIO/MEDQ  D:  09/26/2011  T:  09/26/2011  Job:  [id number]

## 2011-09-26 NOTE — Telephone Encounter (Signed)
Gave patient appointment for 03-27-2012 lab only 04-02-2012 midlevel making sure the md will be here also

## 2011-10-26 DIAGNOSIS — M797 Fibromyalgia: Secondary | ICD-10-CM

## 2011-11-22 ENCOUNTER — Other Ambulatory Visit: Payer: Self-pay | Admitting: Obstetrics and Gynecology

## 2011-11-22 DIAGNOSIS — Z1231 Encounter for screening mammogram for malignant neoplasm of breast: Secondary | ICD-10-CM

## 2011-12-21 ENCOUNTER — Ambulatory Visit
Admission: RE | Admit: 2011-12-21 | Discharge: 2011-12-21 | Disposition: A | Discharge status: Home / Self Care | Payer: Federal, State, Local not specified - PPO | Source: Ambulatory Visit | Attending: Obstetrics and Gynecology | Admitting: Obstetrics and Gynecology

## 2011-12-21 DIAGNOSIS — Z1231 Encounter for screening mammogram for malignant neoplasm of breast: Secondary | ICD-10-CM

## 2012-02-01 ENCOUNTER — Telehealth: Payer: Self-pay | Admitting: *Deleted

## 2012-02-01 NOTE — Telephone Encounter (Signed)
Spoke with pt and informed pt re: Dr. Dalene Carrow reviewed lab results from pt given to triage nurse.   Informed pt that her iron level is stable, and SOB not likely related to anemia.   Asked if pt has cardiologist, pt stated she has appt with her cardiologist today.   Instructed pt that she needed to also call her primary again , or go to ER for further evaluation of cause for SOB ( for over a month now ).   Pt voiced understanding, and stated she would see her cardiologist today.

## 2012-02-01 NOTE — Telephone Encounter (Signed)
PT. HAS SEEN HER PULMONOLOGIST AND HAD A CHEST XRAY. NO PROBLEMS FOUND. ON 01/10/12 SHE SAW HER PRIMARY CARE PHYSICIAN, DR.VELAZQUEZ, AND HAD LAB WORK DONE. RESULTS AS READ TO THIS NURSE- TRANSFERRIN 307.9MG / IRON 67UG/ IRON BINDING CAPACITY TOTAL 375UG/ IRON SATURATION 18%. THIS NOTE TO DR.ODOGWU'S NURSE, THU BRAY,RN.

## 2012-02-02 ENCOUNTER — Other Ambulatory Visit (HOSPITAL_COMMUNITY): Payer: Self-pay | Admitting: Internal Medicine

## 2012-02-02 DIAGNOSIS — I272 Pulmonary hypertension, unspecified: Secondary | ICD-10-CM

## 2012-02-02 DIAGNOSIS — R0600 Dyspnea, unspecified: Secondary | ICD-10-CM

## 2012-02-12 ENCOUNTER — Ambulatory Visit (HOSPITAL_COMMUNITY)
Admission: RE | Admit: 2012-02-12 | Discharge: 2012-02-12 | Disposition: A | Discharge status: Home / Self Care | Payer: Federal, State, Local not specified - PPO | Source: Ambulatory Visit | Attending: Internal Medicine | Admitting: Internal Medicine

## 2012-02-12 DIAGNOSIS — R0989 Other specified symptoms and signs involving the circulatory and respiratory systems: Secondary | ICD-10-CM | POA: Insufficient documentation

## 2012-02-12 DIAGNOSIS — I2789 Other specified pulmonary heart diseases: Secondary | ICD-10-CM | POA: Insufficient documentation

## 2012-02-12 DIAGNOSIS — R0600 Dyspnea, unspecified: Secondary | ICD-10-CM

## 2012-02-12 DIAGNOSIS — R0609 Other forms of dyspnea: Secondary | ICD-10-CM | POA: Insufficient documentation

## 2012-02-12 DIAGNOSIS — I272 Pulmonary hypertension, unspecified: Secondary | ICD-10-CM

## 2012-02-12 NOTE — Progress Notes (Signed)
[facility]   2D echo completed 02/12/2012.   Cindy Quashon Jesus, RDCS   

## 2012-03-08 ENCOUNTER — Other Ambulatory Visit: Payer: Self-pay | Admitting: Obstetrics and Gynecology

## 2012-03-08 DIAGNOSIS — R1032 Left lower quadrant pain: Secondary | ICD-10-CM

## 2012-03-14 ENCOUNTER — Ambulatory Visit
Admission: RE | Admit: 2012-03-14 | Discharge: 2012-03-14 | Disposition: A | Discharge status: Home / Self Care | Payer: Federal, State, Local not specified - PPO | Source: Ambulatory Visit | Attending: Obstetrics and Gynecology | Admitting: Obstetrics and Gynecology

## 2012-03-14 ENCOUNTER — Other Ambulatory Visit: Payer: Federal, State, Local not specified - PPO

## 2012-03-14 DIAGNOSIS — R1032 Left lower quadrant pain: Secondary | ICD-10-CM

## 2012-03-22 DIAGNOSIS — J4520 Mild intermittent asthma, uncomplicated: Secondary | ICD-10-CM | POA: Insufficient documentation

## 2012-03-22 DIAGNOSIS — J4521 Mild intermittent asthma with (acute) exacerbation: Secondary | ICD-10-CM | POA: Insufficient documentation

## 2012-03-22 DIAGNOSIS — Z6841 Body Mass Index (BMI) 40.0 and over, adult: Secondary | ICD-10-CM | POA: Insufficient documentation

## 2012-03-22 DIAGNOSIS — G4700 Insomnia, unspecified: Secondary | ICD-10-CM | POA: Insufficient documentation

## 2012-03-25 ENCOUNTER — Other Ambulatory Visit: Payer: Federal, State, Local not specified - PPO

## 2012-03-26 ENCOUNTER — Ambulatory Visit
Admission: RE | Admit: 2012-03-26 | Discharge: 2012-03-26 | Disposition: A | Discharge status: Home / Self Care | Payer: Federal, State, Local not specified - PPO | Source: Ambulatory Visit | Attending: Obstetrics and Gynecology | Admitting: Obstetrics and Gynecology

## 2012-03-26 MED ORDER — GADOBENATE DIMEGLUMINE 529 MG/ML IV SOLN
20.0000 mL | Freq: Once | INTRAVENOUS | Status: AC | PRN
  Administered 2012-03-26: 20 mL via INTRAVENOUS

## 2012-03-27 ENCOUNTER — Telehealth: Payer: Self-pay | Admitting: Internal Medicine

## 2012-03-27 ENCOUNTER — Other Ambulatory Visit (HOSPITAL_BASED_OUTPATIENT_CLINIC_OR_DEPARTMENT_OTHER): Payer: Federal, State, Local not specified - PPO | Admitting: Lab

## 2012-03-27 DIAGNOSIS — D539 Nutritional anemia, unspecified: Secondary | ICD-10-CM

## 2012-03-27 LAB — CBC WITH DIFFERENTIAL/PLATELET
BASO%: 0.7 % (ref 0.0–2.0)
LYMPH%: 29.4 % (ref 14.0–49.7)
MCHC: 31.8 g/dL (ref 31.5–36.0)
MONO#: 0.6 10*3/uL (ref 0.1–0.9)
Platelets: 156 10*3/uL (ref 145–400)
RBC: 3.77 10*6/uL (ref 3.70–5.45)
WBC: 5.2 10*3/uL (ref 3.9–10.3)

## 2012-03-27 LAB — VITAMIN B12: Vitamin B-12: 509 pg/mL (ref 211–911)

## 2012-03-27 LAB — IRON AND TIBC: UIBC: 310 ug/dL (ref 125–400)

## 2012-03-27 NOTE — Telephone Encounter (Signed)
Former LO pt came in today for lb. Pt on JH's schedule 2/11 and not reassigned. Pt reassigned to MM and given new provider and new appt for 2/10 while at registration.

## 2012-04-01 ENCOUNTER — Ambulatory Visit (HOSPITAL_BASED_OUTPATIENT_CLINIC_OR_DEPARTMENT_OTHER): Payer: Federal, State, Local not specified - PPO | Admitting: Internal Medicine

## 2012-04-01 ENCOUNTER — Telehealth: Payer: Self-pay | Admitting: Internal Medicine

## 2012-04-01 ENCOUNTER — Encounter: Payer: Self-pay | Admitting: Internal Medicine

## 2012-04-01 VITALS — BP 134/65 | HR 82 | Temp 97.8°F | Resp 22 | Ht 67.0 in | Wt >= 6400 oz

## 2012-04-01 DIAGNOSIS — D649 Anemia, unspecified: Secondary | ICD-10-CM

## 2012-04-01 DIAGNOSIS — K912 Postsurgical malabsorption, not elsewhere classified: Secondary | ICD-10-CM

## 2012-04-01 DIAGNOSIS — D508 Other iron deficiency anemias: Secondary | ICD-10-CM

## 2012-04-01 NOTE — Progress Notes (Signed)
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  PRIOR THERAPY: Feraheme infusion on as-needed basis.  CURRENT THERAPY: None  INTERVAL HISTORY: Jillian Eaton 62 y.o. female returns to the clinic today for followup visit accompanied by her husband. She is a former patient of Dr. Dalene Carrow. She has a history of gastric bypass and iron malabsorption. The patient was treated in the past with Feraheme infusion. She is feeling fine today except for mild fatigue and pain from renal calculi. She is currently under evaluation by urology and had a stent placed few days ago. She denied having any significant chest pain but continues to have shortness breath with exertion, no cough or hemoptysis. She denied having any rectal bleeding but she lost some blood recently set to hematuria from the kidney stone. She also has a history of fibroid uterus status post immobilization. The patient had repeat CBC and iron study performed recently and she is here for evaluation and discussion of her lab results. Her last colonoscopy was done 1 year ago and she is scheduled for another one in 2 years. Iron study on 03/27/2012 showed serum iron 78, total iron binding capacity 388 and iron saturation 20%. Ferritin was 168.  MEDICAL HISTORY: Past Medical History  Diagnosis Date  . Asthma   . Hypertension   . Chronic headache   . Urinary incontinence, functional   . Hematuria - cause not known     ALLERGIES:  is allergic to percocet.  MEDICATIONS:  Current Outpatient Prescriptions  Medication Sig Dispense Refill  . albuterol (PROAIR HFA) 108 (90 BASE) MCG/ACT inhaler Inhale 2 puffs into the lungs every 6 (six) hours as needed. For shortness of breath.      Marland Kitchen amLODipine (NORVASC) 2.5 MG tablet  Take 5 mg by mouth every morning.       . budesonide-formoterol (SYMBICORT) 160-4.5 MCG/ACT inhaler 2 puffs 2 (two) times daily.        . cyanocobalamin (,VITAMIN B-12,) 1000 MCG/ML injection       . DULoxetine (CYMBALTA) 60 MG capsule Take 60 mg by mouth every evening.       Marland Kitchen HYDROcodone-acetaminophen (NORCO) 10-325 MG per tablet Take 1 tablet by mouth every 6 (six) hours as needed. For pain.      Marland Kitchen lidocaine (LIDODERM) 5 % Place 1-2 patches onto the skin daily as needed. Applies to back.       Marland Kitchen losartan (COZAAR) 100 MG tablet Take 100 mg by mouth every morning.       . meloxicam (MOBIC) 7.5 MG tablet Take 7.5 mg by mouth 2 (two) times daily.      . mirabegron ER (MYRBETRIQ) 50 MG TB24 Take 50 mg by mouth daily.      Marland Kitchen morphine (MSIR) 15 MG tablet Take 15 mg by mouth 3 (three) times daily as needed.      . pregabalin (LYRICA) 200 MG capsule Take 200 mg by mouth 3 (three) times daily.        Marland Kitchen tiZANidine (ZANAFLEX) 4 MG capsule 2-4 mg 4 (four) times daily as needed. For spasms.      . Vitamin D, Ergocalciferol, (DRISDOL) 50000 UNITS CAPS Take 50,000 Units by mouth 2 (two) times a week.       . zolpidem (AMBIEN CR) 6.25 MG  CR tablet Take 12.5 mg by mouth at bedtime.        No current facility-administered medications for this visit.    SURGICAL HISTORY:  Past Surgical History  Procedure Laterality Date  . Cholecystectomy  1990  . Gastroplasty  1985  . Tubal ligation  1986    REVIEW OF SYSTEMS:  A comprehensive review of systems was negative except for: Constitutional: positive for fatigue Genitourinary: positive for hematuria   PHYSICAL EXAMINATION: General appearance: alert, cooperative and no distress Head: Normocephalic, without obvious abnormality, atraumatic Neck: no adenopathy Lymph nodes: Cervical, supraclavicular, and axillary nodes normal. Resp: clear to auscultation bilaterally Cardio: regular rate and rhythm, S1, S2 normal, no murmur, click, rub or gallop GI: soft,  non-tender; bowel sounds normal; no masses,  no organomegaly Extremities: extremities normal, atraumatic, no cyanosis or edema  ECOG PERFORMANCE STATUS: 1 - Symptomatic but completely ambulatory  Blood pressure 134/65, pulse 82, temperature 97.8 F (36.6 C), temperature source Oral, resp. rate 22, height 5\' 7"  (1.702 m), weight 412 lb (186.882 kg).  LABORATORY DATA: Lab Results  Component Value Date   WBC 5.2 03/27/2012   HGB 11.0* 03/27/2012   HCT 34.8 03/27/2012   MCV 92.2 03/27/2012   PLT 156 03/27/2012      Chemistry      Component Value Date/Time   NA 144 09/22/2011 1105   K 4.6 09/22/2011 1105   CL 106 09/22/2011 1105   CO2 28 09/22/2011 1105   BUN 26* 09/22/2011 1105   CREATININE 0.95 09/22/2011 1105      Component Value Date/Time   CALCIUM 9.4 09/22/2011 1105   ALKPHOS 128* 08/26/2010 1315   AST 29 08/26/2010 1315   ALT 23 08/26/2010 1315   BILITOT 0.3 08/26/2010 1315       RADIOGRAPHIC STUDIES: Mr Pelvis W Wo Contrast  03/26/2012  **ADDENDUM** CREATED: 03/26/2012 13:12:50  BUN and creatinine were obtained on site at [facility] at [address] Results:  BUN 23 mg/dL,  Creatinine 0.9 mg/dL.  **END ADDENDUM** SIGNED BY: John A. Eppie Gibson, M.D.   03/26/2012  *RADIOLOGY REPORT*  Clinical Data: Left lower quadrant pain.  Morbid obesity.  MRI PELVIS WITHOUT AND WITH CONTRAST  Technique:  Multiplanar multisequence MR imaging of the pelvis was performed both before and after administration of intravenous contrast.  Contrast: 20mL MULTIHANCE GADOBENATE DIMEGLUMINE 529 MG/ML IV SOLN  Comparison: None.  Findings: The uterus measures 7.8 x 4.5 x 6.7 cm, for estimated volume of 1 meter 23 ml.  There are multiple small intramural fibroids which involve the uterus diffusely ranging in size from less than 1 cm to 2.3 cm in maximum diameter.  Majority these fibroids are avascular and degenerated, showing no contrast enhancement, and some containing calcification.  Thin endometrium is noted.  Cervix is normal  in appearance.  Artifact is seen from bilateral tubal ligation clips.  The left ovary is normal in size in appearance.  The right ovary contains a 2.2 cm follicle but is otherwise normal appearance.  No ovarian or adnexal masses are identified.  No other pelvic soft tissue masses or lymphadenopathy identified.  No evidence of inflammatory process.  Trace amount of free fluid noted in the left adnexa.  IMPRESSION:  1.  Multiple small uterine fibroids measuring up to 2.3 cm in diameter.  Majority are degenerated/vascularized. 2.  Normal ovaries.  No ovarian or adnexal mass identified.   Original Report Authenticated By: Myles Rosenthal, M.D.     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  PLAN: The patient is doing fine today except for mild fatigue and pain from the kidney stone. Her iron study and ferritin are within the normal range. I recommended for her to continue on observation with repeat CBC and iron study in 3 months. She was advised to call immediately if she has any concerning symptoms in the interval.  All questions were answered. The patient knows to call the clinic with any problems, questions or concerns. We can certainly see the patient much sooner if necessary.  I spent 15 minutes counseling the patient face to face. The total time spent in the appointment was 25 minutes.

## 2012-04-01 NOTE — Patient Instructions (Signed)
Your CBC showed a stable hemoglobin and hematocrit and iron study is normal today. Followup in 3 months with repeat CBC and iron study.

## 2012-04-01 NOTE — Telephone Encounter (Signed)
gv and printed appt schedule for May

## 2012-04-02 ENCOUNTER — Ambulatory Visit: Payer: Federal, State, Local not specified - PPO | Admitting: Family

## 2012-06-26 HISTORY — PX: TOTAL THYROIDECTOMY: SHX2547

## 2012-07-01 ENCOUNTER — Other Ambulatory Visit: Payer: Federal, State, Local not specified - PPO

## 2012-07-03 ENCOUNTER — Telehealth: Payer: Self-pay | Admitting: Internal Medicine

## 2012-07-03 NOTE — Telephone Encounter (Signed)
returned pt call to r/s appt....advised pt to call back so i could r/s

## 2012-07-05 ENCOUNTER — Other Ambulatory Visit: Payer: Self-pay | Admitting: *Deleted

## 2012-07-05 ENCOUNTER — Telehealth: Payer: Self-pay | Admitting: Internal Medicine

## 2012-07-05 NOTE — Telephone Encounter (Signed)
lvm for pt regarding to r/s missed appt to June....mailed pt appt sched and letter

## 2012-07-05 NOTE — Progress Notes (Signed)
Called and left msg on pt cell phone that someone would call with new appts and to not come to appt on 5/19

## 2012-07-08 ENCOUNTER — Ambulatory Visit: Payer: Federal, State, Local not specified - PPO | Admitting: Internal Medicine

## 2012-07-22 ENCOUNTER — Other Ambulatory Visit: Payer: Self-pay | Admitting: *Deleted

## 2012-07-22 ENCOUNTER — Other Ambulatory Visit: Payer: Federal, State, Local not specified - PPO

## 2012-07-22 DIAGNOSIS — D649 Anemia, unspecified: Secondary | ICD-10-CM

## 2012-07-23 ENCOUNTER — Other Ambulatory Visit: Payer: Self-pay | Admitting: *Deleted

## 2012-07-23 ENCOUNTER — Telehealth: Payer: Self-pay | Admitting: Internal Medicine

## 2012-07-23 NOTE — Telephone Encounter (Signed)
lvm for pt regarding to lab added b4 appt.Marland KitchenMarland Kitchen

## 2012-07-24 ENCOUNTER — Ambulatory Visit (HOSPITAL_BASED_OUTPATIENT_CLINIC_OR_DEPARTMENT_OTHER): Payer: Federal, State, Local not specified - PPO | Admitting: Internal Medicine

## 2012-07-24 ENCOUNTER — Encounter: Payer: Self-pay | Admitting: Internal Medicine

## 2012-07-24 ENCOUNTER — Telehealth: Payer: Self-pay | Admitting: Internal Medicine

## 2012-07-24 ENCOUNTER — Other Ambulatory Visit (HOSPITAL_BASED_OUTPATIENT_CLINIC_OR_DEPARTMENT_OTHER): Payer: Federal, State, Local not specified - PPO | Admitting: Lab

## 2012-07-24 VITALS — BP 137/89 | HR 87 | Temp 97.3°F | Resp 19 | Ht 67.0 in | Wt >= 6400 oz

## 2012-07-24 DIAGNOSIS — D508 Other iron deficiency anemias: Secondary | ICD-10-CM

## 2012-07-24 DIAGNOSIS — K912 Postsurgical malabsorption, not elsewhere classified: Secondary | ICD-10-CM

## 2012-07-24 DIAGNOSIS — D649 Anemia, unspecified: Secondary | ICD-10-CM

## 2012-07-24 LAB — IRON AND TIBC
Iron: 62 ug/dL (ref 42–145)
TIBC: 354 ug/dL (ref 250–470)
UIBC: 292 ug/dL (ref 125–400)

## 2012-07-24 LAB — CBC WITH DIFFERENTIAL/PLATELET
BASO%: 0.4 % (ref 0.0–2.0)
EOS%: 7.4 % — ABNORMAL HIGH (ref 0.0–7.0)
HCT: 33.1 % — ABNORMAL LOW (ref 34.8–46.6)
HGB: 10.5 g/dL — ABNORMAL LOW (ref 11.6–15.9)
MCH: 28.4 pg (ref 25.1–34.0)
MCHC: 31.6 g/dL (ref 31.5–36.0)
MONO#: 0.5 10*3/uL (ref 0.1–0.9)
RDW: 16.5 % — ABNORMAL HIGH (ref 11.2–14.5)
WBC: 5.5 10*3/uL (ref 3.9–10.3)
lymph#: 1.3 10*3/uL (ref 0.9–3.3)

## 2012-07-24 LAB — FERRITIN: Ferritin: 218 ng/mL (ref 10–291)

## 2012-07-24 NOTE — Patient Instructions (Signed)
Continue on observation for now with repeat CBC and iron study in 3 months

## 2012-07-24 NOTE — Telephone Encounter (Signed)
Gave pt appt for lab before , MD on September 2014

## 2012-07-24 NOTE — Progress Notes (Signed)
[facility] [phone]   [phone]  OFFICE PROGRESS NOTE  [name], MD 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   PRIOR THERAPY: Feraheme infusion on as-needed basis.   CURRENT THERAPY: None  INTERVAL HISTORY: Jillian Eaton 62 y.o. female returns to the clinic today for follow up visit accompanied by her husband. The patient is feeling fine today with no specific complaints except for mild fatigue. She denied having any dizzy spells. She denied having any bleeding issues. She has no chest pain, shortness of breath, cough or hemoptysis. She had repeat CBC and iron study performed earlier today and she is here for evaluation and discussion of her lab results.  MEDICAL HISTORY: Past Medical History  Diagnosis Date  . Asthma   . Hypertension   . Chronic headache   . Urinary incontinence, functional   . Hematuria - cause not known     ALLERGIES:  is allergic to percocet.  MEDICATIONS:  Current Outpatient Prescriptions  Medication Sig Dispense Refill  . albuterol (PROAIR HFA) 108 (90 BASE) MCG/ACT inhaler Inhale 2 puffs into the lungs every 6 (six) hours as needed. For shortness of breath.      Marland Kitchen amLODipine (NORVASC) 2.5 MG tablet Take 5 mg by mouth every morning.       . budesonide-formoterol (SYMBICORT) 160-4.5 MCG/ACT inhaler 2 puffs 2 (two) times daily.        . calcium carbonate (OS-CAL) 600 MG TABS Take 600 mg by mouth 2 (two) times daily with a meal.      . cyanocobalamin (,VITAMIN B-12,) 1000 MCG/ML injection       . DULoxetine (CYMBALTA) 60 MG capsule Take 60 mg by mouth every evening.       Marland Kitchen HYDROcodone-acetaminophen (NORCO) 10-325 MG per tablet Take 1 tablet by mouth every 6 (six) hours as needed. For pain.      Marland Kitchen levothyroxine (SYNTHROID, LEVOTHROID) 200 MCG tablet Take 200 mcg by mouth daily before breakfast.       . lidocaine (LIDODERM) 5 % Place 1-2 patches onto the skin daily as needed. Applies to back.       Marland Kitchen losartan (COZAAR) 100 MG tablet Take 100 mg by mouth every morning.       . meloxicam (MOBIC) 7.5 MG tablet Take 7.5 mg by mouth 2 (two) times daily.      . mirabegron ER (MYRBETRIQ) 50 MG TB24 Take 50 mg by mouth daily.      . pregabalin (LYRICA) 200 MG capsule Take 200 mg by mouth 3 (three) times daily.        Marland Kitchen tiZANidine (ZANAFLEX) 4 MG capsule 2-4 mg 4 (four) times daily as needed. For spasms.      . Vitamin D, Ergocalciferol, (DRISDOL) 50000 UNITS CAPS Take 50,000 Units by mouth 2 (two) times a week.       . zolpidem (AMBIEN CR) 6.25 MG CR tablet Take 12.5 mg by mouth at bedtime.        No current facility-administered medications for this visit.    SURGICAL HISTORY:  Past Surgical History  Procedure Laterality Date  . Cholecystectomy  1990  . Gastroplasty  1985  . Tubal ligation  1986    REVIEW OF SYSTEMS:  A comprehensive review of systems was negative except for: Constitutional: positive for fatigue   PHYSICAL EXAMINATION: General appearance: alert, cooperative, fatigued and  no distress Head: Normocephalic, without obvious abnormality, atraumatic Neck: no adenopathy Lymph nodes: Cervical, supraclavicular, and axillary nodes normal. Resp: clear to auscultation bilaterally Cardio: regular rate and rhythm, S1, S2 normal, no murmur, click, rub or gallop GI: soft, non-tender; bowel sounds normal; no masses,  no organomegaly Extremities: extremities normal, atraumatic, no cyanosis or edema  ECOG PERFORMANCE STATUS: 1 - Symptomatic but completely ambulatory  Blood pressure 137/89, pulse 87, temperature 97.3 F (36.3 C), temperature source Oral, resp. rate 19, height 5\' 7"  (1.702 m), weight 408 lb 1.6 oz (185.113 kg), SpO2 100.00%.  LABORATORY DATA: Lab Results  Component Value Date   WBC 5.5 07/24/2012   HGB 10.5* 07/24/2012   HCT 33.1* 07/24/2012   MCV 89.9 07/24/2012   PLT  151 07/24/2012      Chemistry      Component Value Date/Time   NA 144 09/22/2011 1105   K 4.6 09/22/2011 1105   CL 106 09/22/2011 1105   CO2 28 09/22/2011 1105   BUN 26* 09/22/2011 1105   CREATININE 0.95 09/22/2011 1105      Component Value Date/Time   CALCIUM 9.4 09/22/2011 1105   ALKPHOS 128* 08/26/2010 1315   AST 29 08/26/2010 1315   ALT 23 08/26/2010 1315   BILITOT 0.3 08/26/2010 1315       RADIOGRAPHIC STUDIES: No results found.  ASSESSMENT: this is a very pleasant 62 years old [place] American female with history of iron deficiency anemia secondary to malabsorption.   PLAN: she status post Feraheme infusion, last was given in 2013 and the patient has been doing fine since that time was mild anemia but no significant change in her iron study and ferritin. I recommended for her to continue on observation for now with repeat CBC, iron study and ferritin in 3 months. She was advised to call immediately if she has any concerning symptoms in the interval. All questions were answered. The patient knows to call the clinic with any problems, questions or concerns. We can certainly see the patient much sooner if necessary.

## 2012-08-02 ENCOUNTER — Encounter: Payer: Self-pay | Admitting: Internal Medicine

## 2012-08-06 ENCOUNTER — Ambulatory Visit: Payer: Federal, State, Local not specified - PPO | Admitting: Internal Medicine

## 2012-10-23 ENCOUNTER — Other Ambulatory Visit: Payer: Federal, State, Local not specified - PPO | Admitting: Lab

## 2012-10-25 ENCOUNTER — Telehealth: Payer: Self-pay | Admitting: Medical Oncology

## 2012-10-25 ENCOUNTER — Other Ambulatory Visit: Payer: Self-pay | Admitting: Medical Oncology

## 2012-10-25 ENCOUNTER — Telehealth: Payer: Self-pay | Admitting: Internal Medicine

## 2012-10-25 ENCOUNTER — Other Ambulatory Visit (HOSPITAL_BASED_OUTPATIENT_CLINIC_OR_DEPARTMENT_OTHER): Payer: Federal, State, Local not specified - PPO | Admitting: Lab

## 2012-10-25 DIAGNOSIS — D649 Anemia, unspecified: Secondary | ICD-10-CM

## 2012-10-25 DIAGNOSIS — D508 Other iron deficiency anemias: Secondary | ICD-10-CM

## 2012-10-25 LAB — CBC WITH DIFFERENTIAL/PLATELET
BASO%: 0.7 % (ref 0.0–2.0)
Basophils Absolute: 0 10*3/uL (ref 0.0–0.1)
EOS%: 3.1 % (ref 0.0–7.0)
HCT: 34.8 % (ref 34.8–46.6)
HGB: 11.2 g/dL — ABNORMAL LOW (ref 11.6–15.9)
LYMPH%: 20.2 % (ref 14.0–49.7)
MCH: 30.4 pg (ref 25.1–34.0)
MCHC: 32.3 g/dL (ref 31.5–36.0)
MCV: 94.1 fL (ref 79.5–101.0)
NEUT%: 66.5 % (ref 38.4–76.8)
Platelets: 163 10*3/uL (ref 145–400)
lymph#: 1.2 10*3/uL (ref 0.9–3.3)

## 2012-10-25 LAB — IRON AND TIBC CHCC: UIBC: 312 ug/dL (ref 120–384)

## 2012-10-25 NOTE — Telephone Encounter (Signed)
Pt.notified

## 2012-10-25 NOTE — Telephone Encounter (Signed)
Per pof pt appts scheduled (9/5 + 9/8) and pt notified.

## 2012-10-25 NOTE — Progress Notes (Signed)
Pt notified to come in today for labs and f/u on Monday.

## 2012-10-28 ENCOUNTER — Ambulatory Visit: Payer: Federal, State, Local not specified - PPO | Admitting: Internal Medicine

## 2012-11-19 ENCOUNTER — Other Ambulatory Visit: Payer: Self-pay

## 2012-11-19 DIAGNOSIS — Z1231 Encounter for screening mammogram for malignant neoplasm of breast: Secondary | ICD-10-CM

## 2012-12-24 ENCOUNTER — Ambulatory Visit
Admission: RE | Admit: 2012-12-24 | Discharge: 2012-12-24 | Disposition: A | Discharge status: Home / Self Care | Payer: Federal, State, Local not specified - PPO | Source: Ambulatory Visit

## 2012-12-24 DIAGNOSIS — Z1231 Encounter for screening mammogram for malignant neoplasm of breast: Secondary | ICD-10-CM

## 2013-01-08 ENCOUNTER — Encounter: Payer: Self-pay | Admitting: Internal Medicine

## 2013-01-08 ENCOUNTER — Ambulatory Visit (INDEPENDENT_AMBULATORY_CARE_PROVIDER_SITE_OTHER): Payer: Federal, State, Local not specified - PPO | Admitting: Internal Medicine

## 2013-01-08 DIAGNOSIS — I272 Pulmonary hypertension, unspecified: Secondary | ICD-10-CM | POA: Insufficient documentation

## 2013-01-08 DIAGNOSIS — R0602 Shortness of breath: Secondary | ICD-10-CM | POA: Insufficient documentation

## 2013-01-08 DIAGNOSIS — I2789 Other specified pulmonary heart diseases: Secondary | ICD-10-CM

## 2013-01-08 DIAGNOSIS — R0609 Other forms of dyspnea: Secondary | ICD-10-CM

## 2013-01-08 NOTE — Progress Notes (Signed)
OFFICE NOTE  Chief Complaint:  DOE  Primary Care Physician: [name], MD  HPI:  Jillian Eaton is a 62 year old female I saw a few weeks ago with a history of super morbid obesity, fibromyalgia and increasing shortness of breath. She had a cath in 2012 which showed normal coronaries, mildly elevating filling pressures, and diastolic pressures with a mean PA of 31. This correlated with her echocardiogram when RV systolic pressures in 2012 were 49 on echo. A repeat echocardiogram was just performed which demonstrated a preserved LVEF of 60-65% with moderate concentric LVH. There was mild to moderate increase in pulmonary artery pressure with peak at 51, which has not significantly changed from her study 1 year ago. Although the pressures look very similar her shortness of breath has been increasing significantly, and I recommended that she wear oxygen at night since she had that at home which does seem to be helping her at least feel better during the day as I suspect she has sleep apnea. She has been tested before which was apparently negative and is considering retesting at some point in the future. Overall I think the main issue obviously is weight, and she unfortunately says that she is not a candidate for gastric bypass and therefore it is a very difficult situation.  I referred her back to her pulmonologist in [facility] for ongoing evaluation of pulmonary hypertension. She tells me that she was then referred to [facility] and saw a specialist there who did another right heart catheterization but did not recommend any medications other than better blood pressure control.   In addition she had problems with kidney stones and underwent 2 operations regarding tthis.  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  She is now dependent on thyroid medication. Unfortunately she's not been able to lose weight, but her weight is fairly stable around 400  pounds as is her shortness of breath.  PMHx:  Past Medical History  Diagnosis Date  . Asthma   . Hypertension   . Chronic headache   . Urinary incontinence, functional   . Hematuria - cause not known   . DOE (dyspnea on exertion)     2D ECHO, 02/12/2012 - EF 60-65%, moderate concentric hypertrophy  . SOB (shortness of breath)     NUCLEAR STRESS TEST, 03/02/2009 - no ischemic ST changes or symptoms    Past Surgical History  Procedure Laterality Date  . Cholecystectomy  1990  . Gastroplasty  1985  . Tubal ligation  1986  . Cardiac catheterization  04/04/2010    No significant obstructive coronary artery disease    FAMHx:  Family History  Problem Relation Age of Onset  . Diabetes Mother   . Epilepsy Mother   . Cancer Mother     Breast  . Hypertension Mother   . Kidney disease Father   . Diabetes Father   . Hypertension Father   . Asthma Father   . Heart disease Father   . Epilepsy Sister   . Cancer Maternal Grandmother   . Cancer Paternal Grandmother     SOCHx:   reports that she has never smoked. She has never used smokeless tobacco. She reports that she drinks alcohol. She reports that she does not use illicit drugs.  ALLERGIES:  Allergies  Allergen Reactions  . Percocet [Oxycodone-Acetaminophen] Nausea And Vomiting and Rash    ROS: A comprehensive review of systems was negative except for: Constitutional: positive for fatigue Respiratory:  positive for dyspnea on exertion Cardiovascular: positive for lower extremity edema  HOME MEDS: Current Outpatient Prescriptions  Medication Sig Dispense Refill  . albuterol (PROAIR HFA) 108 (90 BASE) MCG/ACT inhaler Inhale 2 puffs into the lungs every 6 (six) hours as needed. For shortness of breath.      Marland Kitchen amLODipine (NORVASC) 2.5 MG tablet Take 5 mg by mouth every morning.       . budesonide-formoterol (SYMBICORT) 160-4.5 MCG/ACT inhaler 2 puffs 2 (two) times daily.        . calcium carbonate (OS-CAL) 600 MG TABS Take  600 mg by mouth daily with breakfast.       . celecoxib (CELEBREX) 200 MG capsule Take 200 mg by mouth daily.      . cyanocobalamin (,VITAMIN B-12,) 1000 MCG/ML injection       . cyclobenzaprine (FLEXERIL) 10 MG tablet Take 1 tablet by mouth every 8 (eight) hours as needed.      . DULoxetine (CYMBALTA) 60 MG capsule Take 60 mg by mouth every evening.       . eszopiclone (LUNESTA) 2 MG TABS tablet Take 1 tablet by mouth at bedtime.      Marland Kitchen levothyroxine (SYNTHROID, LEVOTHROID) 200 MCG tablet Take 200 mcg by mouth daily before breakfast.      . levothyroxine (SYNTHROID, LEVOTHROID) 25 MCG tablet Take 25 mcg by mouth daily before breakfast.      . lidocaine (LIDODERM) 5 % Place 1-2 patches onto the skin daily as needed. Applies to back.       Marland Kitchen losartan (COZAAR) 100 MG tablet Take 100 mg by mouth every morning.       . meloxicam (MOBIC) 7.5 MG tablet Take 7.5 mg by mouth daily as needed.       . mirabegron ER (MYRBETRIQ) 50 MG TB24 Take 50 mg by mouth daily.      . Oxycodone HCl 10 MG TABS Take 1 tablet by mouth every 6 (six) hours as needed.      . pregabalin (LYRICA) 200 MG capsule Take 200 mg by mouth 3 (three) times daily.        . Vitamin D, Ergocalciferol, (DRISDOL) 50000 UNITS CAPS Take 50,000 Units by mouth 2 (two) times a week.        No current facility-administered medications for this visit.    LABS/IMAGING: No results found for this or any previous visit (from the past 48 hour(s)). No results found.  VITALS: BP 116/78  Pulse 88  Ht 5\' 7"  (1.702 m)  Wt 403 lb 11.2 oz (183.117 kg)  BMI 63.21 kg/m2  EXAM: General appearance: alert and no distress Neck: no carotid bruit and no JVD Lungs: diminished breath sounds bilaterally Heart: regular rate and rhythm, S1, S2 normal, no murmur, click, rub or gallop Abdomen: massively obese, diffiult to palpate organs Extremities: edema 1+ bilaterally, both great toes are bandaged from recent ingrown toenail surgery Pulses: 2+ and  symmetric Skin: Skin color, texture, turgor normal. No rashes or lesions Neurologic: Grossly normal Psych: Mood, affect normal  EKG: Sinus rhythm at 88  ASSESSMENT: 1. Super morbid obesity, with failure of 3 gastric bypass procedures 2. Pulmonary hypertension 3. Stable dyspnea on exertion 4. Hypertension-controlled   PLAN: 1.   Jillian Eaton has a stable breathlessness which is not unexpected given her extreme weight. Unfortunately there are few if any options for her to lose weight. She did tell me that there was a residential weight loss program at [facility] across $25,000  a month and obviously she cannot afford that. I'm not sure if there are any research studies that she could possibly enroll in. She again was recently seen at [facility] but no additional treatment was offered for pulmonary hypertension. I would recommend continuing to treat her blood pressure as we are and recommend followup in one year or sooner as necessary.  Chrystie Nose, MD, [facility] Attending Cardiologist [facility]  Latravion Graves C 01/08/2013, 4:42 PM

## 2013-01-08 NOTE — Patient Instructions (Signed)
Your physician wants you to follow-up in: 1 year. You will receive a reminder letter in the mail two months in advance. If you don't receive a letter, please call our office to schedule the follow-up appointment.  

## 2013-06-06 ENCOUNTER — Encounter (HOSPITAL_COMMUNITY): Payer: Self-pay | Admitting: Emergency Medicine

## 2013-06-06 ENCOUNTER — Emergency Department (HOSPITAL_COMMUNITY): Payer: Federal, State, Local not specified - PPO

## 2013-06-06 ENCOUNTER — Inpatient Hospital Stay (HOSPITAL_COMMUNITY)
Admission: EM | Admit: 2013-06-06 | Discharge: 2013-06-08 | DRG: 389 | Disposition: A | Discharge status: Home / Self Care | Payer: Federal, State, Local not specified - PPO | Attending: Internal Medicine | Admitting: Internal Medicine

## 2013-06-06 DIAGNOSIS — M545 Low back pain, unspecified: Secondary | ICD-10-CM

## 2013-06-06 DIAGNOSIS — G589 Mononeuropathy, unspecified: Secondary | ICD-10-CM | POA: Diagnosis present

## 2013-06-06 DIAGNOSIS — K56609 Unspecified intestinal obstruction, unspecified as to partial versus complete obstruction: Principal | ICD-10-CM | POA: Diagnosis present

## 2013-06-06 DIAGNOSIS — R0609 Other forms of dyspnea: Secondary | ICD-10-CM

## 2013-06-06 DIAGNOSIS — I272 Pulmonary hypertension, unspecified: Secondary | ICD-10-CM

## 2013-06-06 DIAGNOSIS — Z9884 Bariatric surgery status: Secondary | ICD-10-CM

## 2013-06-06 DIAGNOSIS — Z6841 Body Mass Index (BMI) 40.0 and over, adult: Secondary | ICD-10-CM

## 2013-06-06 DIAGNOSIS — J45909 Unspecified asthma, uncomplicated: Secondary | ICD-10-CM

## 2013-06-06 DIAGNOSIS — Z833 Family history of diabetes mellitus: Secondary | ICD-10-CM

## 2013-06-06 DIAGNOSIS — D649 Anemia, unspecified: Secondary | ICD-10-CM

## 2013-06-06 DIAGNOSIS — Z8249 Family history of ischemic heart disease and other diseases of the circulatory system: Secondary | ICD-10-CM

## 2013-06-06 DIAGNOSIS — R112 Nausea with vomiting, unspecified: Secondary | ICD-10-CM | POA: Diagnosis present

## 2013-06-06 DIAGNOSIS — N390 Urinary tract infection, site not specified: Secondary | ICD-10-CM | POA: Diagnosis present

## 2013-06-06 DIAGNOSIS — Z79899 Other long term (current) drug therapy: Secondary | ICD-10-CM

## 2013-06-06 DIAGNOSIS — Z9089 Acquired absence of other organs: Secondary | ICD-10-CM

## 2013-06-06 DIAGNOSIS — Z820 Family history of epilepsy and other diseases of the nervous system: Secondary | ICD-10-CM

## 2013-06-06 DIAGNOSIS — M76899 Other specified enthesopathies of unspecified lower limb, excluding foot: Secondary | ICD-10-CM

## 2013-06-06 DIAGNOSIS — I10 Essential (primary) hypertension: Secondary | ICD-10-CM | POA: Diagnosis present

## 2013-06-06 DIAGNOSIS — R109 Unspecified abdominal pain: Secondary | ICD-10-CM | POA: Diagnosis present

## 2013-06-06 HISTORY — DX: Disorder of thyroid, unspecified: E07.9

## 2013-06-06 LAB — URINE MICROSCOPIC-ADD ON

## 2013-06-06 LAB — COMPREHENSIVE METABOLIC PANEL
ALT: 11 U/L (ref 0–35)
AST: 18 U/L (ref 0–37)
Albumin: 3.8 g/dL (ref 3.5–5.2)
Alkaline Phosphatase: 96 U/L (ref 39–117)
BILIRUBIN TOTAL: 0.3 mg/dL (ref 0.3–1.2)
BUN: 21 mg/dL (ref 6–23)
CHLORIDE: 100 meq/L (ref 96–112)
CO2: 31 mEq/L (ref 19–32)
Calcium: 8.6 mg/dL (ref 8.4–10.5)
Creatinine, Ser: 0.91 mg/dL (ref 0.50–1.10)
GFR calc non Af Amer: 67 mL/min — ABNORMAL LOW (ref >90)
GFR, EST AFRICAN AMERICAN: 78 mL/min — AB (ref >90)
Glucose, Bld: 102 mg/dL — ABNORMAL HIGH (ref 70–99)
POTASSIUM: 4.5 meq/L (ref 3.7–5.3)
Sodium: 141 mEq/L (ref 137–147)
Total Protein: 8.1 g/dL (ref 6.0–8.3)

## 2013-06-06 LAB — CBC WITH DIFFERENTIAL/PLATELET
Basophils Absolute: 0 10*3/uL (ref 0.0–0.1)
Basophils Relative: 0 % (ref 0–1)
Eosinophils Absolute: 0.2 10*3/uL (ref 0.0–0.7)
Eosinophils Relative: 2 % (ref 0–5)
HCT: 37.7 % (ref 36.0–46.0)
HEMOGLOBIN: 11.9 g/dL — AB (ref 12.0–15.0)
LYMPHS ABS: 1.1 10*3/uL (ref 0.7–4.0)
LYMPHS PCT: 14 % (ref 12–46)
MCH: 29.5 pg (ref 26.0–34.0)
MCHC: 31.6 g/dL (ref 30.0–36.0)
MCV: 93.3 fL (ref 78.0–100.0)
MONOS PCT: 7 % (ref 3–12)
Monocytes Absolute: 0.6 10*3/uL (ref 0.1–1.0)
NEUTROS ABS: 6.4 10*3/uL (ref 1.7–7.7)
NEUTROS PCT: 77 % (ref 43–77)
Platelets: 199 10*3/uL (ref 150–400)
RBC: 4.04 MIL/uL (ref 3.87–5.11)
RDW: 15.1 % (ref 11.5–15.5)
WBC: 8.2 10*3/uL (ref 4.0–10.5)

## 2013-06-06 LAB — URINALYSIS, ROUTINE W REFLEX MICROSCOPIC
GLUCOSE, UA: NEGATIVE mg/dL
KETONES UR: NEGATIVE mg/dL
NITRITE: NEGATIVE
PROTEIN: NEGATIVE mg/dL
Specific Gravity, Urine: 1.02 (ref 1.005–1.030)
Urobilinogen, UA: 1 mg/dL (ref 0.0–1.0)
pH: 5.5 (ref 5.0–8.0)

## 2013-06-06 MED ORDER — HYDROMORPHONE HCL PF 1 MG/ML IJ SOLN
1.0000 mg | Freq: Once | INTRAMUSCULAR | Status: AC
  Administered 2013-06-06: 1 mg via INTRAVENOUS
  Filled 2013-06-06: qty 1

## 2013-06-06 MED ORDER — SODIUM CHLORIDE 0.9 % IV BOLUS (SEPSIS)
1000.0000 mL | Freq: Once | INTRAVENOUS | Status: AC
  Administered 2013-06-06: 1000 mL via INTRAVENOUS

## 2013-06-06 MED ORDER — ONDANSETRON HCL 4 MG/2ML IJ SOLN
4.0000 mg | Freq: Once | INTRAMUSCULAR | Status: AC
  Administered 2013-06-06: 4 mg via INTRAVENOUS
  Filled 2013-06-06: qty 2

## 2013-06-06 MED ORDER — DEXTROSE 5 % IV SOLN
1.0000 g | Freq: Once | INTRAVENOUS | Status: AC
  Administered 2013-06-07: 1 g via INTRAVENOUS
  Filled 2013-06-06: qty 10

## 2013-06-06 NOTE — ED Notes (Signed)
Pt returned from CT °

## 2013-06-06 NOTE — ED Notes (Signed)
Hospitalist at bedside at this time 

## 2013-06-06 NOTE — ED Provider Notes (Addendum)
CSN: [id number]     Arrival date & time 06/06/13  1951 History   First MD Initiated Contact with Patient 06/06/13 2004     Chief Complaint  Patient presents with  . Abdominal Pain     (Consider location/radiation/quality/duration/timing/severity/associated sxs/prior Treatment) Patient is a 63 y.o. female presenting with abdominal pain. The history is provided by the patient.  Abdominal Pain Pain location:  L flank and LLQ Pain quality: sharp and stabbing   Pain radiates to:  L flank Pain severity:  Severe Onset quality:  Gradual Duration:  8 hours Timing:  Constant Progression:  Waxing and waning Chronicity:  New Context: not diet changes, not eating, not sick contacts and not suspicious food intake   Relieved by:  None tried Worsened by:  Movement Ineffective treatments: did not seem to be affected by eating or urinating. Associated symptoms: nausea and vomiting   Associated symptoms: no chest pain, no chills, no constipation, no cough and no diarrhea   Associated symptoms comment:  Diaphoresis when the pain became severe Risk factors: multiple surgeries   Risk factors: no alcohol abuse   Risk factors comment:  Morbid obesity s/p gastric bypass, kidney stones   Past Medical History  Diagnosis Date  . Asthma   . Hypertension   . Chronic headache   . Urinary incontinence, functional   . Hematuria - cause not known   . DOE (dyspnea on exertion)     2D ECHO, 02/12/2012 - EF 60-65%, moderate concentric hypertrophy  . SOB (shortness of breath)     NUCLEAR STRESS TEST, 03/02/2009 - no ischemic ST changes or symptoms  . Thyroid disease    Past Surgical History  Procedure Laterality Date  . Cholecystectomy  1990  . Gastroplasty  1985  . Tubal ligation  1986  . Cardiac catheterization  04/04/2010    No significant obstructive coronary artery disease  . Thyroidectomy     Family History  Problem Relation Age of Onset  . Diabetes Mother   . Epilepsy Mother   . Cancer  Mother     Breast  . Hypertension Mother   . Kidney disease Father   . Diabetes Father   . Hypertension Father   . Asthma Father   . Heart disease Father   . Epilepsy Sister   . Cancer Maternal Grandmother   . Cancer Paternal Grandmother    History  Substance Use Topics  . Smoking status: Never Smoker   . Smokeless tobacco: Never Used  . Alcohol Use: Yes     Comment: wine occ   OB History   Grav Para Term Preterm Abortions TAB SAB Ect Mult Living                 Review of Systems  Constitutional: Negative for chills.  Respiratory: Negative for cough.   Cardiovascular: Negative for chest pain.  Gastrointestinal: Positive for nausea, vomiting and abdominal pain. Negative for diarrhea and constipation.  All other systems reviewed and are negative.     Allergies  Percocet  Home Medications   Prior to Admission medications   Medication Sig Start Date End Date Taking? Authorizing Provider  albuterol (PROAIR HFA) 108 (90 BASE) MCG/ACT inhaler Inhale 2 puffs into the lungs every 6 (six) hours as needed. For shortness of breath.    Historical Provider, MD  amLODipine (NORVASC) 2.5 MG tablet Take 5 mg by mouth every morning.     Historical Provider, MD  budesonide-formoterol (SYMBICORT) 160-4.5 MCG/ACT inhaler 2 puffs 2 (  two) times daily.      Tanda Rockers, MD  calcium carbonate (OS-CAL) 600 MG TABS Take 600 mg by mouth daily with breakfast.     Historical Provider, MD  celecoxib (CELEBREX) 200 MG capsule Take 200 mg by mouth daily.    Historical Provider, MD  cyanocobalamin (,VITAMIN B-12,) 1000 MCG/ML injection  02/12/12   Historical Provider, MD  cyclobenzaprine (FLEXERIL) 10 MG tablet Take 1 tablet by mouth every 8 (eight) hours as needed. 12/25/12   Historical Provider, MD  DULoxetine (CYMBALTA) 60 MG capsule Take 60 mg by mouth every evening.     Historical Provider, MD  eszopiclone (LUNESTA) 2 MG TABS tablet Take 1 tablet by mouth at bedtime. 12/27/12   Historical  Provider, MD  levothyroxine (SYNTHROID, LEVOTHROID) 200 MCG tablet Take 200 mcg by mouth daily before breakfast.    Historical Provider, MD  levothyroxine (SYNTHROID, LEVOTHROID) 25 MCG tablet Take 25 mcg by mouth daily before breakfast.    Historical Provider, MD  lidocaine (LIDODERM) 5 % Place 1-2 patches onto the skin daily as needed. Applies to back.     Historical Provider, MD  losartan (COZAAR) 100 MG tablet Take 100 mg by mouth every morning.     Historical Provider, MD  meloxicam (MOBIC) 7.5 MG tablet Take 7.5 mg by mouth daily as needed.     Historical Provider, MD  mirabegron ER (MYRBETRIQ) 50 MG TB24 Take 50 mg by mouth daily.    Historical Provider, MD  Oxycodone HCl 10 MG TABS Take 1 tablet by mouth every 6 (six) hours as needed. 12/27/12   Historical Provider, MD  pregabalin (LYRICA) 200 MG capsule Take 200 mg by mouth 3 (three) times daily.      Historical Provider, MD  Vitamin D, Ergocalciferol, (DRISDOL) 50000 UNITS CAPS Take 50,000 Units by mouth 2 (two) times a week.     Historical Provider, MD   BP 148/92  Pulse 75  Temp(Src) 97.9 F (36.6 C) (Oral)  Resp 18  Ht 5\' 7"  (1.702 m)  Wt 380 lb (172.367 kg)  BMI 59.50 kg/m2  SpO2 97% Physical Exam  Nursing note and vitals reviewed. Constitutional: She is oriented to person, place, and time. She appears well-developed and well-nourished. No distress.  Morbid obesity  HENT:  Head: Normocephalic and atraumatic.  Mouth/Throat: Oropharynx is clear and moist.  Eyes: Conjunctivae and EOM are normal. Pupils are equal, round, and reactive to light.  Neck: Normal range of motion. Neck supple.  Cardiovascular: Normal rate, regular rhythm and intact distal pulses.   No murmur heard. Pulmonary/Chest: Effort normal and breath sounds normal. No respiratory distress. She has no wheezes. She has no rales.  Abdominal: Soft. Bowel sounds are normal. She exhibits no distension. There is no tenderness. There is CVA tenderness. There is no  rebound and no guarding.    Left flank pain  Musculoskeletal: Normal range of motion. She exhibits no edema and no tenderness.  Neurological: She is alert and oriented to person, place, and time.  Skin: Skin is warm and dry. No rash noted. No erythema.  Psychiatric: She has a normal mood and affect. Her behavior is normal.    ED Course  Procedures (including critical care time) Labs Review Labs Reviewed  CBC WITH DIFFERENTIAL - Abnormal; Notable for the following:    Hemoglobin 11.9 (*)    All other components within normal limits  COMPREHENSIVE METABOLIC PANEL - Abnormal; Notable for the following:    Glucose, Bld 102 (*)  GFR calc non Af Amer 67 (*)    GFR calc Af Amer 78 (*)    All other components within normal limits  URINALYSIS, ROUTINE W REFLEX MICROSCOPIC - Abnormal; Notable for the following:    APPearance CLOUDY (*)    Hgb urine dipstick TRACE (*)    Bilirubin Urine SMALL (*)    Leukocytes, UA MODERATE (*)    All other components within normal limits  URINE MICROSCOPIC-ADD ON - Abnormal; Notable for the following:    Squamous Epithelial / LPF FEW (*)    All other components within normal limits    Imaging Review Ct Abdomen Pelvis Wo Contrast  06/06/2013   CLINICAL DATA:  Left abdominal pain.  Nausea, vomiting.  EXAM: CT ABDOMEN AND PELVIS WITHOUT CONTRAST  TECHNIQUE: Multidetector CT imaging of the abdomen and pelvis was performed following the standard protocol without intravenous contrast.  COMPARISON:  03/28/2012  FINDINGS: Mild cardiomegaly.  Lung bases are clear.  No effusions.  Surgical changes from presumed prior gastric bypass. Prior cholecystectomy. Liver, spleen, pancreas, adrenals have an unremarkable unenhanced appearance. Small nonobstructing stone in the midpole of the right kidney. No visible stones on the left. No hydronephrosis or visible ureteral stones. Urinary bladder is decompressed.  There is gaseous distention of bowel, most prominent involving  the small bowel loops. The colon is predominantly decompressed except for a slight gaseous distension at the sigmoid colon. Moderate stool throughout the colon. Some distal dilated small bowel loops are fecalized. Findings are concerning for small bowel obstruction. Exact transition point or cause is not identified.  Aorta is normal caliber.  Small amount of free fluid in the pelvis.  No acute bony abnormality.  IMPRESSION: Gaseous distention of small bowel loops with air-fluid levels and fecalized distal small bowel loops. Findings concerning for distal small bowel obstruction. Exact transition point or cause is not identified.  Small nonobstructing right midpole renal stone.   Electronically Signed   By: Rolm Baptise M.D.   On: 06/06/2013 23:19   Dg Abd Acute W/chest  06/06/2013   CLINICAL DATA:  Abdominal pain.  Question obstruction.  EXAM: ACUTE ABDOMEN SERIES (ABDOMEN 2 VIEW & CHEST 1 VIEW)  COMPARISON:  CT UROGRAM dated 03/28/2012  FINDINGS: Cardiomegaly with bilateral hilar prominence, likely vascular. Minimal bibasilar atelectasis. No effusions.  Gaseous distension of bowel within the abdomen and pelvis, which appears to be predominantly colon. Low old gas is noted in the rectum. This could reflect adynamic ileus, but given the decompressed rectum, distal colonic obstruction cannot be completely excluded. No free air. Prior cholecystectomy. Surgical clips in the region of the GE junction. No organomegaly. No suspicious calcification. No acute bony abnormality.  IMPRESSION: Moderate gaseous distention of bowel, predominantly colon. This may reflect ileus although a distal colonic obstruction cannot be excluded. No free air.   Electronically Signed   By: Rolm Baptise M.D.   On: 06/06/2013 21:35     EKG Interpretation None      MDM   Final diagnoses:  Small bowel obstruction  UTI (lower urinary tract infection)    Patient presents with left-sided abdominal pain radiating to the flank that  started around noon but became acutely worse approximately one hour prior to arrival. When the pain became severe patient had nausea vomiting and diaphoresis. She states she is burping and passing gas but does have a history of a bowel obstruction approximately 20 years ago. She had eaten normally today and had normal bowel movements. She denies fever  or urinary symptoms. Significantly patient does have a history of kidney stones in the past but states they've always been on the right side.  Vital signs are within normal limits and low suspicion for AAA. Concern for possible bowel obstruction versus kidney stone.  CBC, CMP, UA, acute abdominal series pending. Patient given IV pain and nausea control  10:25 PM Labs consistent with UTI with TNTC WBC and moderate leukocytes and trace blood.  AAS without definitive obstruction.  Will get CT without contrast to further eval for stone on the left.  11:29 PM Ct showed distal small bowel obstruction without transition point.  Pt on re-eval states she is feeling well and passing some gas.  Spoke with Dr. Brantley Stage with surgery and no acute surgical issues at this time and feels pt can advance diet as tolerated and be admitted to hospitalists.  No signs of renal stones.  Blanchie Dessert, MD 06/06/13 [place], MD 06/06/13 [phone]

## 2013-06-06 NOTE — ED Notes (Signed)
Pt c/o L sided abd pain onset this am, worse last hour. +n/v, denies diarrhea.

## 2013-06-06 NOTE — ED Notes (Signed)
MD at bedside at this time.

## 2013-06-06 NOTE — ED Notes (Signed)
PT to xray at this time.

## 2013-06-07 DIAGNOSIS — K56609 Unspecified intestinal obstruction, unspecified as to partial versus complete obstruction: Secondary | ICD-10-CM | POA: Diagnosis present

## 2013-06-07 DIAGNOSIS — I2789 Other specified pulmonary heart diseases: Secondary | ICD-10-CM

## 2013-06-07 DIAGNOSIS — D649 Anemia, unspecified: Secondary | ICD-10-CM

## 2013-06-07 DIAGNOSIS — R109 Unspecified abdominal pain: Secondary | ICD-10-CM

## 2013-06-07 DIAGNOSIS — N390 Urinary tract infection, site not specified: Secondary | ICD-10-CM | POA: Diagnosis present

## 2013-06-07 DIAGNOSIS — R112 Nausea with vomiting, unspecified: Secondary | ICD-10-CM

## 2013-06-07 HISTORY — DX: Unspecified intestinal obstruction, unspecified as to partial versus complete obstruction: K56.609

## 2013-06-07 MED ORDER — DEXTROSE 5 % IV SOLN
1.0000 g | INTRAVENOUS | Status: DC
  Administered 2013-06-07: 1 g via INTRAVENOUS
  Filled 2013-06-07 (×2): qty 10

## 2013-06-07 MED ORDER — ONDANSETRON HCL 4 MG/2ML IJ SOLN
INTRAMUSCULAR | Status: AC
  Administered 2013-06-07: 4 mg via INTRAVENOUS
  Filled 2013-06-07: qty 2

## 2013-06-07 MED ORDER — LEVOTHYROXINE SODIUM 200 MCG PO TABS
200.0000 ug | ORAL_TABLET | Freq: Every day | ORAL | Status: DC
  Administered 2013-06-08: 200 ug via ORAL
  Filled 2013-06-07 (×2): qty 1

## 2013-06-07 MED ORDER — SODIUM CHLORIDE 0.9 % IV SOLN
INTRAVENOUS | Status: DC
  Administered 2013-06-07 – 2013-06-08 (×4): via INTRAVENOUS

## 2013-06-07 MED ORDER — CALCIUM CARBONATE 1250 (500 CA) MG PO TABS
1.0000 | ORAL_TABLET | Freq: Every day | ORAL | Status: DC
Start: 2013-06-08 — End: 2013-06-07
  Filled 2013-06-07: qty 1

## 2013-06-07 MED ORDER — HEPARIN SODIUM (PORCINE) 5000 UNIT/ML IJ SOLN
5000.0000 [IU] | Freq: Three times a day (TID) | INTRAMUSCULAR | Status: DC
  Administered 2013-06-07 – 2013-06-08 (×5): 5000 [IU] via SUBCUTANEOUS
  Filled 2013-06-07 (×8): qty 1

## 2013-06-07 MED ORDER — ONDANSETRON HCL 4 MG/2ML IJ SOLN
4.0000 mg | Freq: Four times a day (QID) | INTRAMUSCULAR | Status: DC | PRN
  Administered 2013-06-07 (×2): 4 mg via INTRAVENOUS
  Filled 2013-06-07: qty 2

## 2013-06-07 MED ORDER — ZOLPIDEM TARTRATE 5 MG PO TABS
5.0000 mg | ORAL_TABLET | Freq: Every day | ORAL | Status: DC
Start: 2013-06-07 — End: 2013-06-08
  Administered 2013-06-07: 5 mg via ORAL
  Filled 2013-06-07: qty 1

## 2013-06-07 MED ORDER — LEVOTHYROXINE SODIUM 25 MCG PO TABS
25.0000 ug | ORAL_TABLET | Freq: Every day | ORAL | Status: DC
  Administered 2013-06-08: 25 ug via ORAL
  Filled 2013-06-07 (×2): qty 1

## 2013-06-07 MED ORDER — AMLODIPINE BESYLATE 5 MG PO TABS
5.0000 mg | ORAL_TABLET | Freq: Every day | ORAL | Status: DC
  Administered 2013-06-07 – 2013-06-08 (×2): 5 mg via ORAL
  Filled 2013-06-07 (×2): qty 1

## 2013-06-07 MED ORDER — ALBUTEROL SULFATE (2.5 MG/3ML) 0.083% IN NEBU: 3.0000 mL | INHALATION_SOLUTION | Freq: Four times a day (QID) | RESPIRATORY_TRACT | Status: DC | PRN

## 2013-06-07 MED ORDER — LOSARTAN POTASSIUM 50 MG PO TABS
100.0000 mg | ORAL_TABLET | ORAL | Status: DC
  Administered 2013-06-07 – 2013-06-08 (×2): 100 mg via ORAL
  Filled 2013-06-07 (×3): qty 2

## 2013-06-07 MED ORDER — CYANOCOBALAMIN 1000 MCG/ML IJ SOLN
1000.0000 ug | INTRAMUSCULAR | Status: DC
  Filled 2013-06-07: qty 1

## 2013-06-07 MED ORDER — MIRABEGRON ER 50 MG PO TB24
50.0000 mg | ORAL_TABLET | Freq: Every day | ORAL | Status: DC
  Administered 2013-06-07 – 2013-06-08 (×2): 50 mg via ORAL
  Filled 2013-06-07 (×2): qty 1

## 2013-06-07 MED ORDER — PREGABALIN 100 MG PO CAPS
200.0000 mg | ORAL_CAPSULE | Freq: Three times a day (TID) | ORAL | Status: DC
  Administered 2013-06-07 – 2013-06-08 (×4): 200 mg via ORAL
  Filled 2013-06-07 (×4): qty 2

## 2013-06-07 MED ORDER — DULOXETINE HCL 60 MG PO CPEP
60.0000 mg | ORAL_CAPSULE | Freq: Every evening | ORAL | Status: DC
  Administered 2013-06-07: 60 mg via ORAL
  Filled 2013-06-07 (×2): qty 1

## 2013-06-07 MED ORDER — HYDROMORPHONE HCL PF 1 MG/ML IJ SOLN
1.0000 mg | INTRAMUSCULAR | Status: DC | PRN
  Administered 2013-06-07 – 2013-06-08 (×7): 1 mg via INTRAVENOUS
  Filled 2013-06-07 (×7): qty 1

## 2013-06-07 MED ORDER — DOCUSATE SODIUM 100 MG PO CAPS
100.0000 mg | ORAL_CAPSULE | Freq: Two times a day (BID) | ORAL | Status: DC
  Administered 2013-06-07 – 2013-06-08 (×3): 100 mg via ORAL
  Filled 2013-06-07 (×4): qty 1

## 2013-06-07 MED ORDER — BUDESONIDE-FORMOTEROL FUMARATE 160-4.5 MCG/ACT IN AERO
2.0000 | INHALATION_SPRAY | Freq: Two times a day (BID) | RESPIRATORY_TRACT | Status: DC
  Administered 2013-06-07 – 2013-06-08 (×2): 2 via RESPIRATORY_TRACT
  Filled 2013-06-07: qty 6

## 2013-06-07 MED ORDER — VITAMIN D (ERGOCALCIFEROL) 1.25 MG (50000 UNIT) PO CAPS: 50000.0000 [IU] | ORAL_CAPSULE | ORAL | Status: DC

## 2013-06-07 MED ORDER — CYCLOBENZAPRINE HCL 10 MG PO TABS
10.0000 mg | ORAL_TABLET | Freq: Three times a day (TID) | ORAL | Status: DC
  Administered 2013-06-07 – 2013-06-08 (×5): 10 mg via ORAL
  Filled 2013-06-07 (×6): qty 1

## 2013-06-07 MED ORDER — BISACODYL 10 MG RE SUPP: 10.0000 mg | Freq: Every day | RECTAL | Status: DC | PRN

## 2013-06-07 MED ORDER — CELECOXIB 200 MG PO CAPS
200.0000 mg | ORAL_CAPSULE | Freq: Every day | ORAL | Status: DC
  Filled 2013-06-07: qty 1

## 2013-06-07 MED ORDER — POLYETHYLENE GLYCOL 3350 17 G PO PACK
17.0000 g | PACK | Freq: Two times a day (BID) | ORAL | Status: DC
  Administered 2013-06-07 – 2013-06-08 (×2): 17 g via ORAL
  Filled 2013-06-07 (×4): qty 1

## 2013-06-07 NOTE — ED Notes (Signed)
Pt reports abdominal pain after eating ice chips. MD made aware, new orders given

## 2013-06-07 NOTE — Progress Notes (Signed)
[facility] PROGRESS NOTE  Jillian Eaton FTD:[id number] DOB: [birth year] DOA: 06/06/2013 PCP: Idamae Schuller, MD  Brief narrative:   Addendum to admission note done 06/07/2013 63 y.o. female with multiple medical co-morbidities including but not limited to morbid obesity status post gastric bypass in past who presented to [facility] ED 06/06/2013 with intractable abdominal pain in lower abdomen for past 8 hours prior to this admission associated with nausea and vomiting. In ED, she was found to have UTI and SBO.   Assessment/Plan:  Principal Problem:   SBO (small bowel obstruction) - continue NPO - restarted some home meds especially for neuropathic pain - appreciate surgery following - continue IV fluids  Active Problems:   UTI (lower urinary tract infection) - continue rocephin - follow up urine culture results    Code Status: full code Family Communication: no family at the bedside  Disposition Plan: home when stable   Robbie Lis, MD  [facility] Pager [phone]  If 7PM-7AM, please contact night-coverage [email or web address] Password [email or web address] 06/07/2013, 11:18 AM   LOS: 1 day   Consultants:  Surgery   Procedures:  None   Antibiotics:  Rocephin 06/06/2013 -->  HPI/Subjective: Feels "hunger pain" this am.  Objective: Filed Vitals:   06/06/13 1955 06/07/13 0053 06/07/13 0134 06/07/13 0624  BP: 148/92  120/67 114/65  Pulse: 75 69 92 86  Temp: 97.9 F (36.6 C)  98.2 F (36.8 C) 98.2 F (36.8 C)  TempSrc: Oral  Oral Oral  Resp: 18 16 18 16   Height: 5\' 7"  (1.702 m)  5\' 7"  (1.702 m)   Weight: 172.367 kg (380 lb)  181 kg (399 lb 0.5 oz)   SpO2: 97% 95% 94% 94%    Intake/Output Summary (Last 24 hours) at 06/07/13 1118 Last data filed at 06/07/13 0900  Gross per 24 hour  Intake 1213.33 ml  Output    550 ml  Net 663.33 ml    Exam:   General:  Pt is alert, follows commands appropriately, not in acute distress  Cardiovascular: Regular rate and  rhythm, S1/S2, no murmurs, no rubs, no gallops  Respiratory: Clear to auscultation bilaterally, no wheezing, no crackles, no rhonchi  Abdomen: obese, distended and tender across mid abdomen, positive bowel sounds  Extremities: lymphedema, pulses DP and PT palpable bilaterally  Neuro: Grossly nonfocal  Data Reviewed: Basic Metabolic Panel:  Recent Labs Lab 06/06/13 2046  NA 141  K 4.5  CL 100  CO2 31  GLUCOSE 102*  BUN 21  CREATININE 0.91  CALCIUM 8.6   Liver Function Tests:  Recent Labs Lab 06/06/13 2046  AST 18  ALT 11  ALKPHOS 96  BILITOT 0.3  PROT 8.1  ALBUMIN 3.8   No results found for this basename: LIPASE, AMYLASE,  in the last 168 hours No results found for this basename: AMMONIA,  in the last 168 hours CBC:  Recent Labs Lab 06/06/13 2046  WBC 8.2  NEUTROABS 6.4  HGB 11.9*  HCT 37.7  MCV 93.3  PLT 199   Cardiac Enzymes: No results found for this basename: CKTOTAL, CKMB, CKMBINDEX, TROPONINI,  in the last 168 hours BNP: No components found with this basename: POCBNP,  CBG: No results found for this basename: GLUCAP,  in the last 168 hours  No results found for this or any previous visit (from the past 240 hour(s)).   Studies: Ct Abdomen Pelvis Wo Contrast 06/06/2013   IMPRESSION: Gaseous distention of small bowel loops with air-fluid levels and fecalized distal small bowel  loops. Findings concerning for distal small bowel obstruction. Exact transition point or cause is not identified.  Small nonobstructing right midpole renal stone.     Dg Abd Acute W/chest 06/06/2013    IMPRESSION: Moderate gaseous distention of bowel, predominantly colon. This may reflect ileus although a distal colonic obstruction cannot be excluded. No free air.     Scheduled Meds: . amLODipine  5 mg Oral Daily  . budesonide-formoterol  2 puff Inhalation BID  . [START ON 06/08/2013] calcium carbonate  1 tablet Oral Q breakfast  . cefTRIAXone (ROCEPHIN)  IV  1 g  Intravenous Q24H  . celecoxib  200 mg Oral Daily  . cyanocobalamin  1,000 mcg Subcutaneous Q30 days  . cyclobenzaprine  10 mg Oral TID  . docusate sodium  100 mg Oral BID  . DULoxetine  60 mg Oral QPM  . heparin  5,000 Units Subcutaneous 3 times per day  . [START ON 06/08/2013] levothyroxine  200 mcg Oral QAC breakfast  . [START ON 06/08/2013] levothyroxine  25 mcg Oral QAC breakfast  . losartan  100 mg Oral BH-q7a  . mirabegron ER  50 mg Oral Daily  . polyethylene glycol  17 g Oral BID  . pregabalin  200 mg Oral TID  . [START ON 06/09/2013] Vitamin D (Ergocalciferol)  50,000 Units Oral Once per day on Mon Thu  . zolpidem  5 mg Oral QHS   Continuous Infusions: . sodium chloride 100 mL/hr at 06/07/13 3716

## 2013-06-07 NOTE — H&P (Addendum)
[facility] History and Physical  Jillian Eaton [id number]:[id number] DOB: [birth year] DOA: 06/06/2013  Referring physician: EDP PCP: Idamae Schuller, MD   Chief Complaint: Abdominal pain   HPI: [name] is a 63 y.o. female who presents to the ED with abdominal pain.  Pain is located in her L flank and LLQ, onset 8 hours ago, has been waxing and waning since that time.  Gradually worsened over course of day and was associated with N/V.  Patient has a history of SBO some years ago due to multiple abdominal surgeries in the past and was concerned that this was SBO again.  In the ED she was found to have a UTI as well as an SBO.  She was treated with hydration, pain medication, and on re-evaluation the patient actually was feeling much better and now passing some gas.  EDP spoke with Dr. Brantley Stage who feels that SBO likely resolving spontaneously at this time and we can advance diet as tolerated and discharge home later this morning if patient feels better.  Review of Systems: Systems reviewed.  As above, otherwise negative  Past Medical History  Diagnosis Date  . Asthma   . Hypertension   . Chronic headache   . Urinary incontinence, functional   . Hematuria - cause not known   . DOE (dyspnea on exertion)     2D ECHO, 02/12/2012 - EF 60-65%, moderate concentric hypertrophy  . SOB (shortness of breath)     NUCLEAR STRESS TEST, 03/02/2009 - no ischemic ST changes or symptoms  . Thyroid disease    Past Surgical History  Procedure Laterality Date  . Cholecystectomy  1990  . Gastroplasty  1985  . Tubal ligation  1986  . Cardiac catheterization  04/04/2010    No significant obstructive coronary artery disease  . Thyroidectomy     Social History:  reports that she has never smoked. She has never used smokeless tobacco. She reports that she drinks alcohol. She reports that she does not use illicit drugs.  Allergies  Allergen Reactions  . Percocet [Oxycodone-Acetaminophen]  Nausea And Vomiting and Rash    Family History  Problem Relation Age of Onset  . Diabetes Mother   . Epilepsy Mother   . Cancer Mother     Breast  . Hypertension Mother   . Kidney disease Father   . Diabetes Father   . Hypertension Father   . Asthma Father   . Heart disease Father   . Epilepsy Sister   . Cancer Maternal Grandmother   . Cancer Paternal Grandmother      Prior to Admission medications   Medication Sig Start Date End Date Taking? Authorizing Provider  albuterol (PROAIR HFA) 108 (90 BASE) MCG/ACT inhaler Inhale 2 puffs into the lungs every 6 (six) hours as needed. For shortness of breath.   Yes Historical Provider, MD  amLODipine (NORVASC) 2.5 MG tablet Take 5 mg by mouth every morning.    Yes Historical Provider, MD  budesonide-formoterol (SYMBICORT) 160-4.5 MCG/ACT inhaler 2 puffs 2 (two) times daily.     Yes Tanda Rockers, MD  calcium carbonate (OS-CAL) 600 MG TABS Take 600 mg by mouth daily with breakfast.    Yes Historical Provider, MD  celecoxib (CELEBREX) 200 MG capsule Take 200 mg by mouth daily.   Yes Historical Provider, MD  cyanocobalamin (,VITAMIN B-12,) 1000 MCG/ML injection Inject 1,000 mcg into the skin every 30 (thirty) days.  02/12/12  Yes Historical Provider, MD  cyclobenzaprine (FLEXERIL) 10 MG tablet  Take 1 tablet by mouth 3 (three) times daily.  12/25/12  Yes Historical Provider, MD  DULoxetine (CYMBALTA) 60 MG capsule Take 60 mg by mouth every evening.    Yes Historical Provider, MD  Eszopiclone (ESZOPICLONE) 3 MG TABS Take 3 mg by mouth at bedtime. Take immediately before bedtime   Yes Historical Provider, MD  HYDROcodone-acetaminophen (NORCO) 10-325 MG per tablet Take 1 tablet by mouth every 6 (six) hours as needed. For pain. 05/30/13  Yes Historical Provider, MD  levothyroxine (SYNTHROID, LEVOTHROID) 200 MCG tablet Take 200 mcg by mouth daily before breakfast.   Yes Historical Provider, MD  levothyroxine (SYNTHROID, LEVOTHROID) 25 MCG tablet Take  25 mcg by mouth daily before breakfast.   Yes Historical Provider, MD  lidocaine (LIDODERM) 5 % Place 1-2 patches onto the skin daily as needed. Applies to back.    Yes Historical Provider, MD  losartan (COZAAR) 100 MG tablet Take 100 mg by mouth every morning.    Yes Historical Provider, MD  mirabegron ER (MYRBETRIQ) 50 MG TB24 Take 50 mg by mouth daily.   Yes Historical Provider, MD  pregabalin (LYRICA) 200 MG capsule Take 200 mg by mouth 3 (three) times daily.     Yes Historical Provider, MD  Vitamin D, Ergocalciferol, (DRISDOL) 50000 UNITS CAPS Take 50,000 Units by mouth 2 (two) times a week. Tuesday and Thursday   Yes Historical Provider, MD   Physical Exam: Filed Vitals:   06/06/13 1955  BP: 148/92  Pulse: 75  Temp: 97.9 F (36.6 C)  Resp: 18    BP 148/92  Pulse 75  Temp(Src) 97.9 F (36.6 C) (Oral)  Resp 18  Ht 5\' 7"  (1.702 m)  Wt 172.367 kg (380 lb)  BMI 59.50 kg/m2  SpO2 97%  General Appearance:    Alert, oriented, no distress, appears stated age  Head:    Normocephalic, atraumatic  Eyes:    PERRL, EOMI, sclera non-icteric        Nose:   Nares without drainage or epistaxis. Mucosa, turbinates normal  Throat:   Moist mucous membranes. Oropharynx without erythema or exudate.  Neck:   Supple. No carotid bruits.  No thyromegaly.  No lymphadenopathy.   Back:     No CVA tenderness, no spinal tenderness  Lungs:     Clear to auscultation bilaterally, without wheezes, rhonchi or rales  Chest wall:    No tenderness to palpitation  Heart:    Regular rate and rhythm without murmurs, gallops, rubs  Abdomen:     Soft, non-tender, nondistended, normal bowel sounds, no organomegaly  Genitalia:    deferred  Rectal:    deferred  Extremities:   No clubbing, cyanosis or edema.  Pulses:   2+ and symmetric all extremities  Skin:   Skin color, texture, turgor normal, no rashes or lesions  Lymph nodes:   Cervical, supraclavicular, and axillary nodes normal  Neurologic:   CNII-XII  intact. Normal strength, sensation and reflexes      throughout    Labs on Admission:  Basic Metabolic Panel:  Recent Labs Lab 06/06/13 2046  NA 141  K 4.5  CL 100  CO2 31  GLUCOSE 102*  BUN 21  CREATININE 0.91  CALCIUM 8.6   Liver Function Tests:  Recent Labs Lab 06/06/13 2046  AST 18  ALT 11  ALKPHOS 96  BILITOT 0.3  PROT 8.1  ALBUMIN 3.8   No results found for this basename: LIPASE, AMYLASE,  in the last 168 hours No results  found for this basename: AMMONIA,  in the last 168 hours CBC:  Recent Labs Lab 06/06/13 2046  WBC 8.2  NEUTROABS 6.4  HGB 11.9*  HCT 37.7  MCV 93.3  PLT 199   Cardiac Enzymes: No results found for this basename: CKTOTAL, CKMB, CKMBINDEX, TROPONINI,  in the last 168 hours  BNP (last 3 results) No results found for this basename: PROBNP,  in the last 8760 hours CBG: No results found for this basename: GLUCAP,  in the last 168 hours  Radiological Exams on Admission: Ct Abdomen Pelvis Wo Contrast  06/06/2013   CLINICAL DATA:  Left abdominal pain.  Nausea, vomiting.  EXAM: CT ABDOMEN AND PELVIS WITHOUT CONTRAST  TECHNIQUE: Multidetector CT imaging of the abdomen and pelvis was performed following the standard protocol without intravenous contrast.  COMPARISON:  03/28/2012  FINDINGS: Mild cardiomegaly.  Lung bases are clear.  No effusions.  Surgical changes from presumed prior gastric bypass. Prior cholecystectomy. Liver, spleen, pancreas, adrenals have an unremarkable unenhanced appearance. Small nonobstructing stone in the midpole of the right kidney. No visible stones on the left. No hydronephrosis or visible ureteral stones. Urinary bladder is decompressed.  There is gaseous distention of bowel, most prominent involving the small bowel loops. The colon is predominantly decompressed except for a slight gaseous distension at the sigmoid colon. Moderate stool throughout the colon. Some distal dilated small bowel loops are fecalized. Findings  are concerning for small bowel obstruction. Exact transition point or cause is not identified.  Aorta is normal caliber.  Small amount of free fluid in the pelvis.  No acute bony abnormality.  IMPRESSION: Gaseous distention of small bowel loops with air-fluid levels and fecalized distal small bowel loops. Findings concerning for distal small bowel obstruction. Exact transition point or cause is not identified.  Small nonobstructing right midpole renal stone.   Electronically Signed   By: Rolm Baptise M.D.   On: 06/06/2013 23:19   Dg Abd Acute W/chest  06/06/2013   CLINICAL DATA:  Abdominal pain.  Question obstruction.  EXAM: ACUTE ABDOMEN SERIES (ABDOMEN 2 VIEW & CHEST 1 VIEW)  COMPARISON:  CT UROGRAM dated 03/28/2012  FINDINGS: Cardiomegaly with bilateral hilar prominence, likely vascular. Minimal bibasilar atelectasis. No effusions.  Gaseous distension of bowel within the abdomen and pelvis, which appears to be predominantly colon. Low old gas is noted in the rectum. This could reflect adynamic ileus, but given the decompressed rectum, distal colonic obstruction cannot be completely excluded. No free air. Prior cholecystectomy. Surgical clips in the region of the GE junction. No organomegaly. No suspicious calcification. No acute bony abnormality.  IMPRESSION: Moderate gaseous distention of bowel, predominantly colon. This may reflect ileus although a distal colonic obstruction cannot be excluded. No free air.   Electronically Signed   By: Rolm Baptise M.D.   On: 06/06/2013 21:35    EKG: Independently reviewed.  Assessment/Plan Principal Problem:   SBO (small bowel obstruction) Active Problems:   UTI (lower urinary tract infection)   1. SBO - See HPI for EDP discussion with Dr. Brantley Stage, initially had been planning on advancing patients diet as tolerated; however, RN has just informed me that patient now re-developing abdominal pain, so will keep patient NPO and put her on IVF.  Bowel rest for  now. 2. UTI - treating with rocephin.    Code Status: Full Code  Family Communication: No family in room Disposition Plan: Admit to obs   Time spent: 50 min  [facility] Pager  [phone]  If 7AM-7PM, please contact the day team taking care of the patient [email or web address] Password [facility] 06/07/2013, 12:19 AM

## 2013-06-07 NOTE — Consult Note (Signed)
Reason for Consult:partial SBO Referring Physician: Markie Eaton is an 63 y.o. female.  HPI:  Pt is a 63 yo F with extensive PSH who presented to the ED last night with acute crampy pain of the left abdomen.  She had some nausea and vomiting.  She had previous SBO, and she was concerned that this had recurred.  She passed some gas in the ED, and was admitted to medicine for observation.  She had some recurrent pain last night, but this has resolved.  She endorses flatus again this AM.  She denies fever/chills.  She has required surgery for SBO in the past (20 years ago in [place]).  She has had 2 prior bariatric surgeries and cholecystectomy.      Past Medical History  Diagnosis Date  . Asthma   . Hypertension   . Chronic headache   . Urinary incontinence, functional   . Hematuria - cause not known   . DOE (dyspnea on exertion)     2D ECHO, 02/12/2012 - EF 60-65%, moderate concentric hypertrophy  . SOB (shortness of breath)     NUCLEAR STRESS TEST, 03/02/2009 - no ischemic ST changes or symptoms  . Thyroid disease     Past Surgical History  Procedure Laterality Date  . Cholecystectomy  1990  . Gastroplasty  1985  . Tubal ligation  1986  . Cardiac catheterization  04/04/2010    No significant obstructive coronary artery disease  . Thyroidectomy      Family History  Problem Relation Age of Onset  . Diabetes Mother   . Epilepsy Mother   . Cancer Mother     Breast  . Hypertension Mother   . Kidney disease Father   . Diabetes Father   . Hypertension Father   . Asthma Father   . Heart disease Father   . Epilepsy Sister   . Cancer Maternal Grandmother   . Cancer Paternal Grandmother     Social History:  reports that she has never smoked. She has never used smokeless tobacco. She reports that she drinks alcohol. She reports that she does not use illicit drugs.  Allergies:  Allergies  Allergen Reactions  . Percocet [Oxycodone-Acetaminophen] Nausea And Vomiting  and Rash    Medications:  Prior to Admission:  Prescriptions prior to admission  Medication Sig Dispense Refill  . albuterol (PROAIR HFA) 108 (90 BASE) MCG/ACT inhaler Inhale 2 puffs into the lungs every 6 (six) hours as needed. For shortness of breath.      Marland Kitchen amLODipine (NORVASC) 2.5 MG tablet Take 5 mg by mouth every morning.       . budesonide-formoterol (SYMBICORT) 160-4.5 MCG/ACT inhaler 2 puffs 2 (two) times daily.        . calcium carbonate (OS-CAL) 600 MG TABS Take 600 mg by mouth daily with breakfast.       . celecoxib (CELEBREX) 200 MG capsule Take 200 mg by mouth daily.      . cyanocobalamin (,VITAMIN B-12,) 1000 MCG/ML injection Inject 1,000 mcg into the skin every 30 (thirty) days.       . cyclobenzaprine (FLEXERIL) 10 MG tablet Take 1 tablet by mouth 3 (three) times daily.       . DULoxetine (CYMBALTA) 60 MG capsule Take 60 mg by mouth every evening.       . Eszopiclone (ESZOPICLONE) 3 MG TABS Take 3 mg by mouth at bedtime. Take immediately before bedtime      . HYDROcodone-acetaminophen (NORCO) 10-325 MG  per tablet Take 1 tablet by mouth every 6 (six) hours as needed. For pain.      Marland Kitchen levothyroxine (SYNTHROID, LEVOTHROID) 200 MCG tablet Take 200 mcg by mouth daily before breakfast.      . levothyroxine (SYNTHROID, LEVOTHROID) 25 MCG tablet Take 25 mcg by mouth daily before breakfast.      . lidocaine (LIDODERM) 5 % Place 1-2 patches onto the skin daily as needed. Applies to back.       Marland Kitchen losartan (COZAAR) 100 MG tablet Take 100 mg by mouth every morning.       . mirabegron ER (MYRBETRIQ) 50 MG TB24 Take 50 mg by mouth daily.      . pregabalin (LYRICA) 200 MG capsule Take 200 mg by mouth 3 (three) times daily.        . Vitamin D, Ergocalciferol, (DRISDOL) 50000 UNITS CAPS Take 50,000 Units by mouth 2 (two) times a week. Tuesday and Thursday        Results for orders placed during the hospital encounter of 06/06/13 (from the past 48 hour(s))  CBC WITH DIFFERENTIAL     Status:  Abnormal   Collection Time    06/06/13  8:46 PM      Result Value Ref Range   WBC 8.2  4.0 - 10.5 K/uL   RBC 4.04  3.87 - 5.11 MIL/uL   Hemoglobin 11.9 (*) 12.0 - 15.0 g/dL   HCT 37.7  36.0 - 46.0 %   MCV 93.3  78.0 - 100.0 fL   MCH 29.5  26.0 - 34.0 pg   MCHC 31.6  30.0 - 36.0 g/dL   RDW 15.1  11.5 - 15.5 %   Platelets 199  150 - 400 K/uL   Neutrophils Relative % 77  43 - 77 %   Neutro Abs 6.4  1.7 - 7.7 K/uL   Lymphocytes Relative 14  12 - 46 %   Lymphs Abs 1.1  0.7 - 4.0 K/uL   Monocytes Relative 7  3 - 12 %   Monocytes Absolute 0.6  0.1 - 1.0 K/uL   Eosinophils Relative 2  0 - 5 %   Eosinophils Absolute 0.2  0.0 - 0.7 K/uL   Basophils Relative 0  0 - 1 %   Basophils Absolute 0.0  0.0 - 0.1 K/uL  COMPREHENSIVE METABOLIC PANEL     Status: Abnormal   Collection Time    06/06/13  8:46 PM      Result Value Ref Range   Sodium 141  137 - 147 mEq/L   Potassium 4.5  3.7 - 5.3 mEq/L   Chloride 100  96 - 112 mEq/L   CO2 31  19 - 32 mEq/L   Glucose, Bld 102 (*) 70 - 99 mg/dL   BUN 21  6 - 23 mg/dL   Creatinine, Ser 0.91  0.50 - 1.10 mg/dL   Calcium 8.6  8.4 - 10.5 mg/dL   Total Protein 8.1  6.0 - 8.3 g/dL   Albumin 3.8  3.5 - 5.2 g/dL   AST 18  0 - 37 U/L   ALT 11  0 - 35 U/L   Alkaline Phosphatase 96  39 - 117 U/L   Total Bilirubin 0.3  0.3 - 1.2 mg/dL   GFR calc non Af Amer 67 (*) >90 mL/min   GFR calc Af Amer 78 (*) >90 mL/min   Comment: (NOTE)     The eGFR has been calculated using the CKD EPI equation.  This calculation has not been validated in all clinical situations.     eGFR's persistently <90 mL/min signify possible Chronic Kidney     Disease.  URINALYSIS, ROUTINE W REFLEX MICROSCOPIC     Status: Abnormal   Collection Time    06/06/13  9:54 PM      Result Value Ref Range   Color, Urine YELLOW  YELLOW   APPearance CLOUDY (*) CLEAR   Specific Gravity, Urine 1.020  1.005 - 1.030   pH 5.5  5.0 - 8.0   Glucose, UA NEGATIVE  NEGATIVE mg/dL   Hgb urine dipstick  TRACE (*) NEGATIVE   Bilirubin Urine SMALL (*) NEGATIVE   Ketones, ur NEGATIVE  NEGATIVE mg/dL   Protein, ur NEGATIVE  NEGATIVE mg/dL   Urobilinogen, UA 1.0  0.0 - 1.0 mg/dL   Nitrite NEGATIVE  NEGATIVE   Leukocytes, UA MODERATE (*) NEGATIVE  URINE MICROSCOPIC-ADD ON     Status: Abnormal   Collection Time    06/06/13  9:54 PM      Result Value Ref Range   Squamous Epithelial / LPF FEW (*) RARE   WBC, UA TOO NUMEROUS TO COUNT  <3 WBC/hpf   RBC / HPF 0-2  <3 RBC/hpf   Bacteria, UA RARE  RARE   Urine-Other MUCOUS PRESENT      Ct Abdomen Pelvis Wo Contrast  06/06/2013   CLINICAL DATA:  Left abdominal pain.  Nausea, vomiting.  EXAM: CT ABDOMEN AND PELVIS WITHOUT CONTRAST  TECHNIQUE: Multidetector CT imaging of the abdomen and pelvis was performed following the standard protocol without intravenous contrast.  COMPARISON:  03/28/2012  FINDINGS: Mild cardiomegaly.  Lung bases are clear.  No effusions.  Surgical changes from presumed prior gastric bypass. Prior cholecystectomy. Liver, spleen, pancreas, adrenals have an unremarkable unenhanced appearance. Small nonobstructing stone in the midpole of the right kidney. No visible stones on the left. No hydronephrosis or visible ureteral stones. Urinary bladder is decompressed.  There is gaseous distention of bowel, most prominent involving the small bowel loops. The colon is predominantly decompressed except for a slight gaseous distension at the sigmoid colon. Moderate stool throughout the colon. Some distal dilated small bowel loops are fecalized. Findings are concerning for small bowel obstruction. Exact transition point or cause is not identified.  Aorta is normal caliber.  Small amount of free fluid in the pelvis.  No acute bony abnormality.  IMPRESSION: Gaseous distention of small bowel loops with air-fluid levels and fecalized distal small bowel loops. Findings concerning for distal small bowel obstruction. Exact transition point or cause is not  identified.  Small nonobstructing right midpole renal stone.   Electronically Signed   By: Rolm Baptise M.D.   On: 06/06/2013 23:19   Dg Abd Acute W/chest  06/06/2013   CLINICAL DATA:  Abdominal pain.  Question obstruction.  EXAM: ACUTE ABDOMEN SERIES (ABDOMEN 2 VIEW & CHEST 1 VIEW)  COMPARISON:  CT UROGRAM dated 03/28/2012  FINDINGS: Cardiomegaly with bilateral hilar prominence, likely vascular. Minimal bibasilar atelectasis. No effusions.  Gaseous distension of bowel within the abdomen and pelvis, which appears to be predominantly colon. Low old gas is noted in the rectum. This could reflect adynamic ileus, but given the decompressed rectum, distal colonic obstruction cannot be completely excluded. No free air. Prior cholecystectomy. Surgical clips in the region of the GE junction. No organomegaly. No suspicious calcification. No acute bony abnormality.  IMPRESSION: Moderate gaseous distention of bowel, predominantly colon. This may reflect ileus although a distal colonic obstruction  cannot be excluded. No free air.   Electronically Signed   By: Rolm Baptise M.D.   On: 06/06/2013 21:35    Review of Systems  Constitutional: Negative.   HENT: Negative.   Eyes: Negative.   Respiratory: Negative.   Cardiovascular: Negative.   Gastrointestinal: Positive for nausea and abdominal pain.  Genitourinary: Positive for dysuria.  Musculoskeletal: Negative.   Skin: Negative.   Neurological: Negative.   Endo/Heme/Allergies: Negative.   Psychiatric/Behavioral: Negative.    Blood pressure 114/65, pulse 86, temperature 98.2 F (36.8 C), temperature source Oral, resp. rate 16, height 5' 7"  (1.702 m), weight 399 lb 0.5 oz (181 kg), SpO2 94.00%. Physical Exam  Constitutional: She is oriented to person, place, and time. She appears well-developed and well-nourished.  HENT:  Head: Normocephalic and atraumatic.  Eyes: Conjunctivae are normal. Pupils are equal, round, and reactive to light. No scleral icterus.   Neck: Neck supple.  Cardiovascular: Normal rate, regular rhythm, normal heart sounds and intact distal pulses.   Respiratory: Effort normal and breath sounds normal. No respiratory distress. She has no wheezes. She has no rales. She exhibits no tenderness.  GI: Soft. Bowel sounds are normal. She exhibits distension. She exhibits no mass. There is no tenderness. There is no rebound and no guarding.  Neurological: She is alert and oriented to person, place, and time. Coordination normal.  Skin: Skin is warm and dry. No rash noted. No erythema. No pallor.  Psychiatric: She has a normal mood and affect. Her behavior is normal. Judgment and thought content normal.    Assessment/Plan: Partial SBO  Pt continues to pass a small amount of gas.  Would stick to very limited ice chips.   Repeat films tomorrow. Treat UTI May be ileus related to UTI.   If increased abdominal pain or n/v, I recommend placement of NGT.   Otherwise, can observe.   Jillian Eaton 06/07/2013, 8:21 AM

## 2013-06-08 ENCOUNTER — Inpatient Hospital Stay (HOSPITAL_COMMUNITY): Payer: Federal, State, Local not specified - PPO

## 2013-06-08 DIAGNOSIS — J45909 Unspecified asthma, uncomplicated: Secondary | ICD-10-CM

## 2013-06-08 LAB — URINE CULTURE

## 2013-06-08 MED ORDER — DSS 100 MG PO CAPS: 100.0000 mg | ORAL_CAPSULE | Freq: Every day | ORAL | Status: DC | PRN

## 2013-06-08 MED ORDER — POLYETHYLENE GLYCOL 3350 17 G PO PACK: 17.0000 g | PACK | Freq: Every day | ORAL | Status: DC | PRN

## 2013-06-08 NOTE — Discharge Summary (Addendum)
Physician Discharge Summary  Jillian Eaton BSW:[id number] DOB: [birth year] DOA: 06/06/2013  PCP: Idamae Schuller, MD  Admit date: 06/06/2013 Discharge date: 06/08/2013  Recommendations for Outpatient Follow-up:  1. Follow up with PCP in 1 week after discharge or sooner if symptoms worsen 2. Minimize pain meds as it may worsen SBO.  Discharge Diagnoses:  Principal Problem:   SBO (small bowel obstruction) Active Problems:   UTI (lower urinary tract infection)    Discharge Condition: stable  Diet recommendation: heart healthy   History of present illness:  63 y.o. female with multiple medical co-morbidities including but not limited to morbid obesity status post gastric bypass in past who presented to [facility] ED 06/06/2013 with intractable abdominal pain in lower abdomen for past 8 hours prior to this admission associated with nausea and vomiting. In ED, she was found to have UTI and SBO.   Assessment/Plan:   Principal Problem:  SBO (small bowel obstruction)  - tolerates regular diet - per surgery ok to go home if tolerates diet - did not require NG tube placement as SBO spontaneously clinically resolved  - repeat abdominal x ray showed stable bowel appearance since prior studies.  Active Problems:  UTI (lower urinary tract infection)  - continue rocephin in hospital; today is day 3 of abx so may be stopped at the time of discharge  - urine culture not significant amt of colonies, multiple morphologies present non predominant; likely contaminant   Code Status: full code  Family Communication: no family at the bedside     Signed:  Robbie Lis, MD  [facility] 06/08/2013, 1:11 PM  Pager #: [phone]  Procedures:  None   Consultations:  Surgery   Discharge Exam: Filed Vitals:   06/08/13 0500  BP: 103/64  Pulse: 79  Temp: 97.8 F (36.6 C)  Resp: 16   Filed Vitals:   06/07/13 2012 06/07/13 2041 06/08/13 0500 06/08/13 0921  BP:  104/61 103/64    Pulse:  81 79   Temp:  98 F (36.7 C) 97.8 F (36.6 C)   TempSrc:  Oral Oral   Resp:  16 16   Height:      Weight:      SpO2: 93% 93% 93% 92%    General: Pt is alert, follows commands appropriately, not in acute distress Cardiovascular: Regular rate and rhythm, S1/S2 +, no murmurs, no rubs, no gallops Respiratory: Clear to auscultation bilaterally, no wheezing, no crackles, no rhonchi Abdominal: Soft, non tender, obese, non distended, bowel sounds +, no guarding Extremities: lymphedema, no cyanosis, pulses palpable bilaterally DP and PT Neuro: Grossly nonfocal  Discharge Instructions  Discharge Orders   Future Orders Complete By Expires   Call MD for:  difficulty breathing, headache or visual disturbances  As directed    Call MD for:  persistant dizziness or light-headedness  As directed    Call MD for:  persistant nausea and vomiting  As directed    Call MD for:  severe uncontrolled pain  As directed    Diet - low sodium heart healthy  As directed    Discharge instructions  As directed    Increase activity slowly  As directed        Medication List         amLODipine 2.5 MG tablet  Commonly known as:  NORVASC  Take 5 mg by mouth every morning.     budesonide-formoterol 160-4.5 MCG/ACT inhaler  Commonly known as:  SYMBICORT  2 puffs 2 (two) times daily.  calcium carbonate 600 MG Tabs tablet  Commonly known as:  OS-CAL  Take 600 mg by mouth daily with breakfast.     celecoxib 200 MG capsule  Commonly known as:  CELEBREX  Take 200 mg by mouth daily.     cyanocobalamin 1000 MCG/ML injection  Commonly known as:  (VITAMIN B-12)  Inject 1,000 mcg into the skin every 30 (thirty) days.     cyclobenzaprine 10 MG tablet  Commonly known as:  FLEXERIL  Take 1 tablet by mouth 3 (three) times daily.     DSS 100 MG Caps  Take 100 mg by mouth daily as needed for mild constipation.     DULoxetine 60 MG capsule  Commonly known as:  CYMBALTA  Take 60 mg by mouth  every evening.     eszopiclone 3 MG Tabs  Generic drug:  Eszopiclone  Take 3 mg by mouth at bedtime. Take immediately before bedtime     HYDROcodone-acetaminophen 10-325 MG per tablet  Commonly known as:  NORCO  Take 1 tablet by mouth every 6 (six) hours as needed. For pain.     levothyroxine 200 MCG tablet  Commonly known as:  SYNTHROID, LEVOTHROID  Take 200 mcg by mouth daily before breakfast.     levothyroxine 25 MCG tablet  Commonly known as:  SYNTHROID, LEVOTHROID  Take 25 mcg by mouth daily before breakfast.     lidocaine 5 %  Commonly known as:  LIDODERM  Place 1-2 patches onto the skin daily as needed. Applies to back.     losartan 100 MG tablet  Commonly known as:  COZAAR  Take 100 mg by mouth every morning.     MYRBETRIQ 50 MG Tb24 tablet  Generic drug:  mirabegron ER  Take 50 mg by mouth daily.     polyethylene glycol packet  Commonly known as:  MIRALAX / GLYCOLAX  Take 17 g by mouth daily as needed for mild constipation.     pregabalin 200 MG capsule  Commonly known as:  LYRICA  Take 200 mg by mouth 3 (three) times daily.     PROAIR HFA 108 (90 BASE) MCG/ACT inhaler  Generic drug:  albuterol  Inhale 2 puffs into the lungs every 6 (six) hours as needed. For shortness of breath.     Vitamin D (Ergocalciferol) 50000 UNITS Caps capsule  Commonly known as:  DRISDOL  Take 50,000 Units by mouth 2 (two) times a week. Tuesday and Thursday           Follow-up Information   Follow up with [facility], MD. Schedule an appointment as soon as possible for a visit in 1 week.   Specialty:  Internal Medicine   Contact information:   [id number] [place] [address] [phone]        The results of significant diagnostics from this hospitalization (including imaging, microbiology, ancillary and laboratory) are listed below for reference.    Significant Diagnostic Studies: Ct Abdomen Pelvis Wo Contrast  06/06/2013   CLINICAL DATA:   Left abdominal pain.  Nausea, vomiting.  EXAM: CT ABDOMEN AND PELVIS WITHOUT CONTRAST  TECHNIQUE: Multidetector CT imaging of the abdomen and pelvis was performed following the standard protocol without intravenous contrast.  COMPARISON:  03/28/2012  FINDINGS: Mild cardiomegaly.  Lung bases are clear.  No effusions.  Surgical changes from presumed prior gastric bypass. Prior cholecystectomy. Liver, spleen, pancreas, adrenals have an unremarkable unenhanced appearance. Small nonobstructing stone in the midpole of the right kidney. No  visible stones on the left. No hydronephrosis or visible ureteral stones. Urinary bladder is decompressed.  There is gaseous distention of bowel, most prominent involving the small bowel loops. The colon is predominantly decompressed except for a slight gaseous distension at the sigmoid colon. Moderate stool throughout the colon. Some distal dilated small bowel loops are fecalized. Findings are concerning for small bowel obstruction. Exact transition point or cause is not identified.  Aorta is normal caliber.  Small amount of free fluid in the pelvis.  No acute bony abnormality.  IMPRESSION: Gaseous distention of small bowel loops with air-fluid levels and fecalized distal small bowel loops. Findings concerning for distal small bowel obstruction. Exact transition point or cause is not identified.  Small nonobstructing right midpole renal stone.   Electronically Signed   By: Rolm Baptise M.D.   On: 06/06/2013 23:19   Dg Abd Acute W/chest  06/06/2013   CLINICAL DATA:  Abdominal pain.  Question obstruction.  EXAM: ACUTE ABDOMEN SERIES (ABDOMEN 2 VIEW & CHEST 1 VIEW)  COMPARISON:  CT UROGRAM dated 03/28/2012  FINDINGS: Cardiomegaly with bilateral hilar prominence, likely vascular. Minimal bibasilar atelectasis. No effusions.  Gaseous distension of bowel within the abdomen and pelvis, which appears to be predominantly colon. Low old gas is noted in the rectum. This could reflect adynamic  ileus, but given the decompressed rectum, distal colonic obstruction cannot be completely excluded. No free air. Prior cholecystectomy. Surgical clips in the region of the GE junction. No organomegaly. No suspicious calcification. No acute bony abnormality.  IMPRESSION: Moderate gaseous distention of bowel, predominantly colon. This may reflect ileus although a distal colonic obstruction cannot be excluded. No free air.   Electronically Signed   By: Rolm Baptise M.D.   On: 06/06/2013 21:35    Microbiology: Recent Results (from the past 240 hour(s))  URINE CULTURE     Status: None   Collection Time    06/06/13  9:54 PM      Result Value Ref Range Status   Specimen Description URINE, CLEAN CATCH   Final   Special Requests NONE   Final   Culture  Setup Time     Final   Value: 06/07/2013 05:44     Performed at [facility]     Final   Value: 50,000 COLONIES/ML     Performed at [facility]   Culture     Final   Value: Multiple bacterial morphotypes present, none predominant. Suggest appropriate recollection if clinically indicated.     Performed at [facility]   Report Status 06/08/2013 FINAL   Final     Labs: Basic Metabolic Panel:  Recent Labs Lab 06/06/13 2046  NA 141  K 4.5  CL 100  CO2 31  GLUCOSE 102*  BUN 21  CREATININE 0.91  CALCIUM 8.6   Liver Function Tests:  Recent Labs Lab 06/06/13 2046  AST 18  ALT 11  ALKPHOS 96  BILITOT 0.3  PROT 8.1  ALBUMIN 3.8   No results found for this basename: LIPASE, AMYLASE,  in the last 168 hours No results found for this basename: AMMONIA,  in the last 168 hours CBC:  Recent Labs Lab 06/06/13 2046  WBC 8.2  NEUTROABS 6.4  HGB 11.9*  HCT 37.7  MCV 93.3  PLT 199   Cardiac Enzymes: No results found for this basename: CKTOTAL, CKMB, CKMBINDEX, TROPONINI,  in the last 168 hours BNP: BNP (last 3 results) No results found for  this basename: PROBNP,  in the last 8760  hours CBG: No results found for this basename: GLUCAP,  in the last 168 hours  Time coordinating discharge: Over 30 minutes

## 2013-06-08 NOTE — Progress Notes (Signed)
Consult received for Advance Directives.  Met with Pt to provide her with Advance Directives information.  Pt stated that she already received this information and that she will notify the RN if she needs further assistance.  No further CSW needs identified.  Bernita Raisin, [place] [facility] [phone]

## 2013-06-08 NOTE — Progress Notes (Signed)
Patient discharged to home with family via wheelchair, discharge instructions reviewed with patient who verbalized understanding. No new RX's given to patient. 

## 2013-06-08 NOTE — Discharge Instructions (Signed)
Small Bowel Obstruction A small bowel obstruction is a blockage (obstruction) of the small intestine (small bowel). The small bowel is a long, slender tube that connects the stomach to the colon. Its job is to absorb nutrients from the fluids and foods you consume into the bloodstream.  CAUSES  There are many causes of intestinal blockage. The most common ones include:  Hernias. This is a more common cause in children than adults.  Inflammatory bowel disease (enteritis and colitis).  Twisting of the bowel (volvulus).  Tumors.  Scar tissue (adhesions) from previous surgery or radiation treatment.  Recent surgery. This may cause an acute small bowel obstruction called an ileus. SYMPTOMS   Abdominal pain. This may be dull cramps or sharp pain. It may occur in one area or may be present in the entire abdomen. Pain can range from mild to severe, depending on the degree of obstruction.  Nausea and vomiting. Vomit may be greenish or yellow bile color.  Distended or swollen stomach. Abdominal bloating is a common symptom.  Constipation.  Lack of passing gas.  Frequent belching.  Diarrhea. This may occur if runny stool is able to leak around the obstruction. DIAGNOSIS  Your caregiver can usually diagnose small bowel obstruction by taking a history, doing a physical exam, and taking X-rays. If the cause is unclear, a CT scan (computerized tomography) of your abdomen and pelvis may be needed. TREATMENT  Treatment of the blockage depends on the cause and how bad the problem is.   Sometimes, the obstruction improves with bed rest and intravenous (IV) fluids.  Resting the bowel is very important. This means following a simple diet. Sometimes, a clear liquid diet may be required for several days.  Sometimes, a small tube (nasogastric tube) is placed into the stomach to decompress the bowel. When the bowel is blocked, it usually swells up like a balloon filled with air and fluids.  Decompression means that the air and fluids are removed by suction through that tube. This can help with pain, discomfort, and nausea. It can also help the obstruction resolve faster.  Surgery may be required if other treatments do not work. Bowel obstruction from a hernia may require early surgery and can be an emergency procedure. Adhesions that cause frequent or severe obstructions may also require surgery. HOME CARE INSTRUCTIONS If your bowel obstruction is only partial or incomplete, you may be allowed to go home.  Get plenty of rest.  Follow your diet as directed by your caregiver.  Only consume clear liquids until your condition improves.  Avoid solid foods as instructed. SEEK IMMEDIATE MEDICAL CARE IF:  You have increased pain or cramping.  You vomit blood.  You have uncontrolled vomiting or nausea.  You cannot drink fluids due to vomiting or pain.  You develop confusion.  You begin feeling very dry or thirsty (dehydrated).  You have severe bloating.  You have chills.  You have a fever.  You feel extremely weak or you faint. MAKE SURE YOU:  Understand these instructions.  Will watch your condition.  Will get help right away if you are not doing well or get worse. Document Released: 04/25/2005 Document Revised: 05/01/2011 Document Reviewed: 04/22/2010 ExitCare Patient Information 2014 [employer].  

## 2013-06-08 NOTE — Progress Notes (Signed)
Subjective: Pt with significant flatus overnight and no recurrence of crampy abdominal pain.  Tolerated clears.    Objective: Vital signs in last 24 hours: Temp:  [97.8 F (36.6 C)-98.4 F (36.9 C)] 97.8 F (36.6 C) (04/19 0500) Pulse Rate:  [79-83] 79 (04/19 0500) Resp:  [16-20] 16 (04/19 0500) BP: (103-110)/(61-75) 103/64 mmHg (04/19 0500) SpO2:  [93 %-98 %] 93 % (04/19 0500) Last BM Date: 06/06/13  Intake/Output from previous day: 04/18 0701 - 04/19 0700 In: 2320 [P.O.:720; I.V.:1600] Out: 1650 [Urine:1650] Intake/Output this shift:    General appearance: alert, cooperative and no distress GI: soft, obese, non tender  Lab Results:   Recent Labs  06/06/13 2046  WBC 8.2  HGB 11.9*  HCT 37.7  PLT 199   BMET  Recent Labs  06/06/13 2046  NA 141  K 4.5  CL 100  CO2 31  GLUCOSE 102*  BUN 21  CREATININE 0.91  CALCIUM 8.6   PT/INR No results found for this basename: LABPROT, INR,  in the last 72 hours ABG No results found for this basename: PHART, PCO2, PO2, HCO3,  in the last 72 hours  Studies/Results: Ct Abdomen Pelvis Wo Contrast  06/06/2013   CLINICAL DATA:  Left abdominal pain.  Nausea, vomiting.  EXAM: CT ABDOMEN AND PELVIS WITHOUT CONTRAST  TECHNIQUE: Multidetector CT imaging of the abdomen and pelvis was performed following the standard protocol without intravenous contrast.  COMPARISON:  03/28/2012  FINDINGS: Mild cardiomegaly.  Lung bases are clear.  No effusions.  Surgical changes from presumed prior gastric bypass. Prior cholecystectomy. Liver, spleen, pancreas, adrenals have an unremarkable unenhanced appearance. Small nonobstructing stone in the midpole of the right kidney. No visible stones on the left. No hydronephrosis or visible ureteral stones. Urinary bladder is decompressed.  There is gaseous distention of bowel, most prominent involving the small bowel loops. The colon is predominantly decompressed except for a slight gaseous distension at  the sigmoid colon. Moderate stool throughout the colon. Some distal dilated small bowel loops are fecalized. Findings are concerning for small bowel obstruction. Exact transition point or cause is not identified.  Aorta is normal caliber.  Small amount of free fluid in the pelvis.  No acute bony abnormality.  IMPRESSION: Gaseous distention of small bowel loops with air-fluid levels and fecalized distal small bowel loops. Findings concerning for distal small bowel obstruction. Exact transition point or cause is not identified.  Small nonobstructing right midpole renal stone.   Electronically Signed   By: Rolm Baptise M.D.   On: 06/06/2013 23:19   Dg Abd Acute W/chest  06/06/2013   CLINICAL DATA:  Abdominal pain.  Question obstruction.  EXAM: ACUTE ABDOMEN SERIES (ABDOMEN 2 VIEW & CHEST 1 VIEW)  COMPARISON:  CT UROGRAM dated 03/28/2012  FINDINGS: Cardiomegaly with bilateral hilar prominence, likely vascular. Minimal bibasilar atelectasis. No effusions.  Gaseous distension of bowel within the abdomen and pelvis, which appears to be predominantly colon. Low old gas is noted in the rectum. This could reflect adynamic ileus, but given the decompressed rectum, distal colonic obstruction cannot be completely excluded. No free air. Prior cholecystectomy. Surgical clips in the region of the GE junction. No organomegaly. No suspicious calcification. No acute bony abnormality.  IMPRESSION: Moderate gaseous distention of bowel, predominantly colon. This may reflect ileus although a distal colonic obstruction cannot be excluded. No free air.   Electronically Signed   By: Rolm Baptise M.D.   On: 06/06/2013 21:35    Anti-infectives: Anti-infectives   Start  Dose/Rate Route Frequency Ordered Stop   06/07/13 2330  cefTRIAXone (ROCEPHIN) 1 g in dextrose 5 % 50 mL IVPB     1 g 100 mL/hr over 30 Minutes Intravenous Every 24 hours 06/07/13 0013     06/06/13 2330  cefTRIAXone (ROCEPHIN) 1 g in dextrose 5 % 50 mL IVPB     1  g 100 mL/hr over 30 Minutes Intravenous  Once 06/06/13 2324 06/07/13 0101      Assessment/Plan: s/p * No surgery found * Advance diet doing full liquids now and reg for "dinner 1400"   If tolerates diet advance, ok for home this evening or tomorrow, depending on how she is doing.    LOS: 2 days    Stark Klein 06/08/2013

## 2013-07-30 DIAGNOSIS — E042 Nontoxic multinodular goiter: Secondary | ICD-10-CM | POA: Insufficient documentation

## 2013-07-30 DIAGNOSIS — Z9884 Bariatric surgery status: Secondary | ICD-10-CM | POA: Insufficient documentation

## 2013-07-30 DIAGNOSIS — G43009 Migraine without aura, not intractable, without status migrainosus: Secondary | ICD-10-CM | POA: Insufficient documentation

## 2013-07-30 DIAGNOSIS — R059 Cough, unspecified: Secondary | ICD-10-CM | POA: Insufficient documentation

## 2013-07-30 DIAGNOSIS — R05 Cough: Secondary | ICD-10-CM | POA: Insufficient documentation

## 2013-07-30 DIAGNOSIS — E559 Vitamin D deficiency, unspecified: Secondary | ICD-10-CM | POA: Insufficient documentation

## 2013-07-30 DIAGNOSIS — J449 Chronic obstructive pulmonary disease, unspecified: Secondary | ICD-10-CM

## 2013-07-30 DIAGNOSIS — E539 Vitamin B deficiency, unspecified: Secondary | ICD-10-CM | POA: Insufficient documentation

## 2013-07-30 DIAGNOSIS — E049 Nontoxic goiter, unspecified: Secondary | ICD-10-CM | POA: Insufficient documentation

## 2013-07-30 DIAGNOSIS — R002 Palpitations: Secondary | ICD-10-CM | POA: Insufficient documentation

## 2013-07-30 DIAGNOSIS — N3941 Urge incontinence: Secondary | ICD-10-CM | POA: Insufficient documentation

## 2013-07-30 DIAGNOSIS — R310 Gross hematuria: Secondary | ICD-10-CM | POA: Insufficient documentation

## 2013-07-30 DIAGNOSIS — R109 Unspecified abdominal pain: Secondary | ICD-10-CM | POA: Insufficient documentation

## 2013-11-19 ENCOUNTER — Other Ambulatory Visit: Payer: Self-pay

## 2013-11-19 DIAGNOSIS — Z1231 Encounter for screening mammogram for malignant neoplasm of breast: Secondary | ICD-10-CM

## 2013-12-25 ENCOUNTER — Ambulatory Visit
Admission: RE | Admit: 2013-12-25 | Discharge: 2013-12-25 | Disposition: A | Discharge status: Home / Self Care | Payer: Federal, State, Local not specified - PPO | Source: Ambulatory Visit

## 2013-12-25 DIAGNOSIS — Z1231 Encounter for screening mammogram for malignant neoplasm of breast: Secondary | ICD-10-CM

## 2014-01-28 ENCOUNTER — Encounter: Payer: Self-pay | Admitting: Internal Medicine

## 2014-01-28 ENCOUNTER — Ambulatory Visit (INDEPENDENT_AMBULATORY_CARE_PROVIDER_SITE_OTHER): Payer: Federal, State, Local not specified - PPO | Admitting: Internal Medicine

## 2014-01-28 DIAGNOSIS — I272 Pulmonary hypertension, unspecified: Secondary | ICD-10-CM

## 2014-01-28 DIAGNOSIS — I270 Primary pulmonary hypertension: Secondary | ICD-10-CM

## 2014-01-28 DIAGNOSIS — R0609 Other forms of dyspnea: Secondary | ICD-10-CM

## 2014-01-28 NOTE — Patient Instructions (Signed)
Your physician wants you to follow-up in: 1 year with Dr. Hilty. You will receive a reminder letter in the mail two months in advance. If you don't receive a letter, please call our office to schedule the follow-up appointment.  

## 2014-01-28 NOTE — Progress Notes (Signed)
OFFICE NOTE  Chief Complaint:  DOE  Primary Care Physician: Rodena Medin, MD  HPI:  Jillian Eaton is a 63 year old female I saw a few weeks ago with a history of super morbid obesity, fibromyalgia and increasing shortness of breath. She had a cath in 2012 which showed normal coronaries, mildly elevating filling pressures, and diastolic pressures with a mean PA of 31. This correlated with her echocardiogram when RV systolic pressures in 7989 were 49 on echo. A repeat echocardiogram was just performed which demonstrated a preserved LVEF of 60-65% with moderate concentric LVH. There was mild to moderate increase in pulmonary artery pressure with peak at 51, which has not significantly changed from her study 1 year ago. Although the pressures look very similar her shortness of breath has been increasing significantly, and I recommended that she wear oxygen at night since she had that at home which does seem to be helping her at least feel better during the day as I suspect she has sleep apnea. She has been tested before which was apparently negative and is considering retesting at some point in the future. Overall I think the main issue obviously is weight, and she unfortunately says that she is not a candidate for gastric bypass and therefore it is a very difficult situation.  I referred her back to her pulmonologist in [facility] for ongoing evaluation of pulmonary hypertension. She tells me that she was then referred to [facility] and saw a specialist there who did another right heart catheterization but did not recommend any medications other than better blood pressure control.   In addition she had problems with kidney stones and underwent 2 operations regarding tthis.  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  She is now dependent on thyroid medication. Unfortunately she's not been able to lose weight, but her weight is fairly stable around 400 pounds as  is her shortness of breath.  Cath in 2012:  LEFT HEART CATHETERIZATION  OPERATOR: Mali Hilty, MD, and Rolland Porter, MD  INDICATION: Dyspnea on exertion and chest pressure.  HISTORY OF PRESENT ILLNESS: Jillian Eaton is a morbidly obese (BMI greater than 44) female with a history of failed gastric bypass x2 with an increasing weight gain and increasing shortness of breath as well as new- onset dyspnea on exertion. She reports that she can only walk about 10 feet before she becomes short of breath and has chest pressure which she says get better with rest. She has numerous cardiac risk factors and given the high likelihood of false positive stress test, I have referred her for cardiac catheterization, both left and right heart as she has had elevated RVSP on echocardiogram of approximately 30 to 31 mmHg.  PROCEDURE: The patient was brought into the cardiac catheterization lab, sterilely prepped and draped in the usual fashion. The area around the right femoral artery and right brachial vein were cleansed and draped to allow an attempt at radial arterial and brachiocephalic venous access. IV was not able to be obtained prior to the procedure given her body habitus. With difficulty in assessing the vein, the ultrasound was eventually used to identify the right brachiocephalic vein, however, cannulation with needle and wire was not possible as the vein was very small in caliber. After mild local bleeding was controlled, we did turn our attention to the right femoral vein and with great difficulty using the ultrasound, the right femoral vein was accessed by Dr. Ellyn Hack using  a straight wire and a needle. After venous access was obtained, the attention was turned to the right radial artery by Dr. Ellyn Hack and simultaneous to that right heart catheterization was performed by myself without any difficulty. The right radial artery was successfully cannulated and subsequently  left coronary artery system was selectively injected with a 5-French TIG 4.0 catheter, however the right coronary artery could not be cannulated with the TIG catheter and was eventually cannulated with a JR-4 catheter. LV pressure was measured with a pigtail catheter. Estimated blood loss was about 30 mL. There were no acute complications. The patient received a total of 9 mg of Versed throughout the procedure as well as 225 mcg of fentanyl and was at no point greater than moderately sedated. She received 5000 units of heparin about 10 mL of a radial cocktail.  FINDINGS: 1. Left main - short, no disease. 2. LAD - no significant disease. 3. Left circumflex, no significant disease. 4. RCA - dominant, no disease, large-caliber vessel. 5. LVEDP = 20 mmHg. 6. RA - 12. 7. RV 38/12. 8. PA - 43/19 (31). 9. PCWP - 24. 10.TPG - 7. 11.Fick cardiac output/Fick cardiac index - 10.56/3.84. 12.Thermodilution cardiac output/thermodilution cardiac index -  6.78/2.47. 13.Aortic saturation - 94%. 14.PA saturation - 68%.  IMPRESSION: 1. No significant obstructive coronary artery disease. 2. LVEDP = 20 mmHg. 3. Borderline pulmonary venous hypertension. 4. High cardiac output.  Jillian Eaton returns today for follow-up. She reports that her shortness of breath has not significantly worsened, in fact, possibly is slightly better. She did have thyroid surgery last year at [facility] and apparently underwent right heart catheterization prior to that. I do not have those records immediately available. Unfortunately she continues to maintain her weight and has not been able to lose any. She is complaining of some numbness and tingling in her feet which is likely neuropathy. She is on medication including Lyrica which she takes for fibromyalgia.  PMHx:  Past Medical History  Diagnosis Date  . Asthma   . Hypertension   . Chronic headache   . Urinary incontinence,  functional   . Hematuria - cause not known   . DOE (dyspnea on exertion)     2D ECHO, 02/12/2012 - EF 60-65%, moderate concentric hypertrophy  . SOB (shortness of breath)     NUCLEAR STRESS TEST, 03/02/2009 - no ischemic ST changes or symptoms  . Thyroid disease     Past Surgical History  Procedure Laterality Date  . Cholecystectomy  1990  . Gastroplasty  1985  . Tubal ligation  1986  . Cardiac catheterization  04/04/2010    No significant obstructive coronary artery disease  . Thyroidectomy      FAMHx:  Family History  Problem Relation Age of Onset  . Diabetes Mother   . Epilepsy Mother   . Cancer Mother     Breast  . Hypertension Mother   . Kidney disease Father   . Diabetes Father   . Hypertension Father   . Asthma Father   . Heart disease Father   . Epilepsy Sister   . Cancer Maternal Grandmother   . Cancer Paternal Grandmother     SOCHx:   reports that she has never smoked. She has never used smokeless tobacco. She reports that she drinks alcohol. She reports that she does not use illicit drugs.  ALLERGIES:  No Known Allergies  ROS: A comprehensive review of systems was negative except for: Constitutional: positive for fatigue  Respiratory: positive for dyspnea on exertion Cardiovascular: positive for lower extremity edema  HOME MEDS: Current Outpatient Prescriptions  Medication Sig Dispense Refill  . albuterol (PROAIR HFA) 108 (90 BASE) MCG/ACT inhaler Inhale 2 puffs into the lungs every 6 (six) hours as needed. For shortness of breath.    Marland Kitchen amLODipine (NORVASC) 2.5 MG tablet Take 5 mg by mouth every morning.     . budesonide-formoterol (SYMBICORT) 160-4.5 MCG/ACT inhaler Inhale 2 puffs into the lungs 2 (two) times daily as needed.     . calcium carbonate (OS-CAL) 600 MG TABS Take 600 mg by mouth daily with breakfast.     . celecoxib (CELEBREX) 200 MG capsule Take 200 mg by mouth daily.    . cyanocobalamin (,VITAMIN B-12,) 1000 MCG/ML injection Inject 1,000  mcg into the skin every 30 (thirty) days.     . cyclobenzaprine (FLEXERIL) 10 MG tablet Take 1 tablet by mouth 3 (three) times daily.     . DULoxetine (CYMBALTA) 60 MG capsule Take 60 mg by mouth every evening.     . Eszopiclone (ESZOPICLONE) 3 MG TABS Take 3 mg by mouth at bedtime. Take immediately before bedtime    . levothyroxine (SYNTHROID, LEVOTHROID) 300 MCG tablet Take 300 mcg by mouth daily before breakfast.    . lidocaine (LIDODERM) 5 % Place 1-2 patches onto the skin daily as needed. Applies to back.     Marland Kitchen losartan (COZAAR) 100 MG tablet Take 100 mg by mouth every morning.     . mirabegron ER (MYRBETRIQ) 50 MG TB24 Take 50 mg by mouth daily.    . Oxycodone HCl 10 MG TABS Take 10 mg by mouth every 6 (six) hours.    . pregabalin (LYRICA) 200 MG capsule Take 200 mg by mouth 3 (three) times daily.      . Vitamin D, Ergocalciferol, (DRISDOL) 50000 UNITS CAPS Take 50,000 Units by mouth 2 (two) times a week. Tuesday and Thursday     No current facility-administered medications for this visit.    LABS/IMAGING: No results found for this or any previous visit (from the past 48 hour(s)). No results found.  VITALS: BP 146/80 mmHg  Pulse 84  Ht 5\' 7"  (1.702 m)  Wt 402 lb 4.8 oz (182.482 kg)  BMI 62.99 kg/m2  EXAM: General appearance: alert and no distress Neck: no carotid bruit and no JVD Lungs: diminished breath sounds bilaterally Heart: regular rate and rhythm, S1, S2 normal, no murmur, click, rub or gallop Abdomen: massively obese, diffiult to palpate organs Extremities: edema 1+ bilaterally, both great toes are bandaged from recent ingrown toenail surgery Pulses: 2+ and symmetric Skin: Skin color, texture, turgor normal. No rashes or lesions Neurologic: Grossly normal Psych: Mood, affect normal  EKG: Sinus rhythm at 84  ASSESSMENT: 1. Super morbid obesity, with failure of 3 gastric bypass procedures 2. Pulmonary hypertension - PA pressure of 51 mmHg 3. Stable dyspnea on  exertion 4. Hypertension-controlled   PLAN: 1.   Jillian Eaton has a stable breathlessness which is not unexpected given her extreme weight. Unfortunately there are few if any options for her to lose weight. She apparently had a recent right heart catheterization at [facility] shows stable pulmonary pressures. Her hypertension is controlled however top normal today. Should be followed up by her primary care provider. Unfortunately there is little less to offer at this time. I've encouraged her to continue to work on weight loss.  Follow-up annually.  Pixie Casino, MD, [facility] Attending Cardiologist [facility]  HILTY,Kenneth C 01/28/2014, 3:58 PM

## 2014-01-29 ENCOUNTER — Telehealth: Payer: Self-pay | Admitting: Internal Medicine

## 2014-01-29 NOTE — Telephone Encounter (Signed)
Faxed signed release for request of records to [facility] for Dr [name].  Faxed on 01/29/14. lp

## 2014-02-20 HISTORY — PX: SHOULDER ARTHROSCOPY WITH SUBACROMIAL DECOMPRESSION: SHX5684

## 2014-03-18 ENCOUNTER — Other Ambulatory Visit: Payer: Self-pay | Admitting: Gastroenterology

## 2014-03-24 DIAGNOSIS — M170 Bilateral primary osteoarthritis of knee: Secondary | ICD-10-CM | POA: Insufficient documentation

## 2014-04-02 ENCOUNTER — Encounter (HOSPITAL_COMMUNITY): Payer: Self-pay | Admitting: *Deleted

## 2014-04-10 ENCOUNTER — Encounter (HOSPITAL_COMMUNITY)
Admission: RE | Disposition: A | Discharge status: Home / Self Care | Payer: Self-pay | Source: Ambulatory Visit | Attending: Gastroenterology

## 2014-04-10 ENCOUNTER — Ambulatory Visit (HOSPITAL_COMMUNITY): Payer: Federal, State, Local not specified - PPO | Admitting: Anesthesiology

## 2014-04-10 ENCOUNTER — Ambulatory Visit (HOSPITAL_COMMUNITY)
Admission: RE | Admit: 2014-04-10 | Discharge: 2014-04-10 | Disposition: A | Discharge status: Home / Self Care | Payer: Federal, State, Local not specified - PPO | Source: Ambulatory Visit | Attending: Gastroenterology | Admitting: Gastroenterology

## 2014-04-10 ENCOUNTER — Encounter (HOSPITAL_COMMUNITY): Payer: Self-pay | Admitting: Anesthesiology

## 2014-04-10 DIAGNOSIS — J45909 Unspecified asthma, uncomplicated: Secondary | ICD-10-CM | POA: Diagnosis not present

## 2014-04-10 DIAGNOSIS — Z8249 Family history of ischemic heart disease and other diseases of the circulatory system: Secondary | ICD-10-CM | POA: Diagnosis not present

## 2014-04-10 DIAGNOSIS — E079 Disorder of thyroid, unspecified: Secondary | ICD-10-CM | POA: Diagnosis not present

## 2014-04-10 DIAGNOSIS — M797 Fibromyalgia: Secondary | ICD-10-CM | POA: Diagnosis not present

## 2014-04-10 DIAGNOSIS — D125 Benign neoplasm of sigmoid colon: Secondary | ICD-10-CM | POA: Diagnosis not present

## 2014-04-10 DIAGNOSIS — Z87442 Personal history of urinary calculi: Secondary | ICD-10-CM | POA: Insufficient documentation

## 2014-04-10 DIAGNOSIS — Z825 Family history of asthma and other chronic lower respiratory diseases: Secondary | ICD-10-CM | POA: Insufficient documentation

## 2014-04-10 DIAGNOSIS — I10 Essential (primary) hypertension: Secondary | ICD-10-CM | POA: Diagnosis not present

## 2014-04-10 DIAGNOSIS — Z862 Personal history of diseases of the blood and blood-forming organs and certain disorders involving the immune mechanism: Secondary | ICD-10-CM | POA: Diagnosis not present

## 2014-04-10 DIAGNOSIS — Z9981 Dependence on supplemental oxygen: Secondary | ICD-10-CM | POA: Insufficient documentation

## 2014-04-10 DIAGNOSIS — I272 Other secondary pulmonary hypertension: Secondary | ICD-10-CM | POA: Insufficient documentation

## 2014-04-10 DIAGNOSIS — R51 Headache: Secondary | ICD-10-CM | POA: Insufficient documentation

## 2014-04-10 DIAGNOSIS — D120 Benign neoplasm of cecum: Secondary | ICD-10-CM | POA: Diagnosis not present

## 2014-04-10 DIAGNOSIS — Z86010 Personal history of colonic polyps: Secondary | ICD-10-CM | POA: Diagnosis not present

## 2014-04-10 DIAGNOSIS — Z09 Encounter for follow-up examination after completed treatment for conditions other than malignant neoplasm: Secondary | ICD-10-CM | POA: Diagnosis present

## 2014-04-10 DIAGNOSIS — Z6841 Body Mass Index (BMI) 40.0 and over, adult: Secondary | ICD-10-CM | POA: Diagnosis not present

## 2014-04-10 HISTORY — DX: Personal history of other medical treatment: Z92.89

## 2014-04-10 HISTORY — DX: Anemia, unspecified: D64.9

## 2014-04-10 HISTORY — DX: Cardiac murmur, unspecified: R01.1

## 2014-04-10 HISTORY — DX: Personal history of urinary calculi: Z87.442

## 2014-04-10 HISTORY — DX: Fibromyalgia: M79.7

## 2014-04-10 HISTORY — PX: COLONOSCOPY WITH PROPOFOL: SHX5780

## 2014-04-10 LAB — HM COLONOSCOPY

## 2014-04-10 SURGERY — COLONOSCOPY WITH PROPOFOL
Anesthesia: Monitor Anesthesia Care

## 2014-04-10 MED ORDER — PROPOFOL 10 MG/ML IV BOLUS
INTRAVENOUS | Status: AC
  Filled 2014-04-10: qty 20

## 2014-04-10 MED ORDER — PROPOFOL INFUSION 10 MG/ML OPTIME
INTRAVENOUS | Status: DC | PRN
  Administered 2014-04-10: 120 ug/kg/min via INTRAVENOUS

## 2014-04-10 MED ORDER — LACTATED RINGERS IV SOLN
INTRAVENOUS | Status: DC
  Administered 2014-04-10: 1000 mL via INTRAVENOUS

## 2014-04-10 MED ORDER — PROPOFOL 10 MG/ML IV BOLUS
INTRAVENOUS | Status: DC | PRN
  Administered 2014-04-10 (×2): 20 mg via INTRAVENOUS
  Administered 2014-04-10: 50 mg via INTRAVENOUS
  Administered 2014-04-10: 20 mg via INTRAVENOUS

## 2014-04-10 MED ORDER — SODIUM CHLORIDE 0.9 % IV SOLN: INTRAVENOUS | Status: DC

## 2014-04-10 SURGICAL SUPPLY — 21 items

## 2014-04-10 NOTE — Discharge Instructions (Signed)

## 2014-04-10 NOTE — Anesthesia Postprocedure Evaluation (Signed)
  Anesthesia Post-op Note  Patient: Jillian Eaton  Procedure(s) Performed: Procedure(s): COLONOSCOPY WITH PROPOFOL (N/A) Patient is awake and responsive. Pain and nausea are reasonably well controlled. Vital signs are stable and clinically acceptable. Oxygen saturation is clinically acceptable. There are no apparent anesthetic complications at this time. Patient is ready for discharge.

## 2014-04-10 NOTE — H&P (Signed)
  Jillian Eaton HPI: This is a 64 year old female here for a follow up colonoscopy.  ON 09/30/2010 she was identified to have multiple tubular adenomas.  No changes to her GI symptoms in the meantime.  Past Medical History  Diagnosis Date  . Asthma   . Hypertension   . Chronic headache   . Urinary incontinence, functional   . Hematuria - cause not known   . DOE (dyspnea on exertion)     2D ECHO, 02/12/2012 - EF 60-65%, moderate concentric hypertrophy  . SOB (shortness of breath)     NUCLEAR STRESS TEST, 03/02/2009 - no ischemic ST changes or symptoms  . Thyroid disease     "goiter"  . Heart murmur   . History of kidney stones     x 2 '13, '14 surgery to remove  . Fibromyalgia     nerve pain"left side at waist level" "can't lay on that side without pain" , "HOB elevation helps"  . Anemia   . Transfusion history     10 yrs+    Past Surgical History  Procedure Laterality Date  . Cholecystectomy  1990  . 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 Tubal ligation  1986  . Cardiac catheterization  04/04/2010    No significant obstructive coronary artery disease  . Thyroidectomy    . Diagnostic laparoscopy      x2 bowel obstructions(adhesions)  . Colonoscopy w/ polypectomy      Family History  Problem Relation Age of Onset  . Diabetes Mother   . Epilepsy Mother   . Cancer Mother     Breast  . Hypertension Mother   . Kidney disease Father   . Diabetes Father   . Hypertension Father   . Asthma Father   . Heart disease Father   . Epilepsy Sister   . Cancer Maternal Grandmother   . Cancer Paternal Grandmother     Social History:  reports that she has never smoked. She has never used smokeless tobacco. She reports that she drinks alcohol. She reports that she does not use illicit drugs.  Allergies: No Known Allergies  Medications:  Scheduled:  Continuous: . sodium chloride    . lactated ringers 1,000 mL (04/10/14 1003)     No results found for this or any previous visit (from the past 24 hour(s)).   No results found.  ROS:  As stated above in the HPI otherwise negative.  There were no vitals taken for this visit.    PE: Gen: NAD, Alert and Oriented HEENT:  [place]/AT, EOMI Neck: Supple, no LAD Lungs: CTA Bilaterally CV: RRR without M/G/R ABM: Soft, NTND, +BS, morbidly obese Ext: No C/C/E  Assessment/Plan: 1) Personal history of polyps - Colonoscopy today.  Jillian Eaton D 04/10/2014, 10:06 AM

## 2014-04-10 NOTE — Op Note (Signed)
[facility] [place], [id number]   COLONOSCOPY PROCEDURE REPORT  PATIENT: Jillian Eaton, Jillian Eaton  MR#: [id number] BIRTHDATE: 22-Mar-1950 , 63  yrs. old GENDER: female ENDOSCOPIST: Carol Ada, MD REFERRED BY: PROCEDURE DATE:  04/28/2014 PROCEDURE:   Colonoscopy with snare polypectomy ASA CLASS:   Class III INDICATIONS: Personal history of polyps MEDICATIONS: Monitored anesthesia care  DESCRIPTION OF PROCEDURE:   After the risks and benefits and of the procedure were explained, informed consent was obtained.  revealed no abnormalities of the rectum.    The Pentax Adult Colonscope [id number]  endoscope was introduced through the anus and advanced to the cecum, which was identified by both the appendix and ileocecal valve .  The quality of the prep was good. .  The instrument was then slowly withdrawn as the colon was fully examined.   FINDINGS: A 3 mm , possible, cecal polyp was removed with a cold snare.  A 3 mm sessile sigmoid colon polyp was removed with a cold snare.  No evidence of any masses, inflammation, ulcerations, erosions, or vascular abnormalities.     Retroflexed views revealed no abnormalities.     The scope was then withdrawn from the patient and the procedure completed. WITHDRAWAL TIME: 15 minutes 0 seconds COMPLICATIONS: There were no immediate complications. ENDOSCOPIC IMPRESSION: 1) Polyps.  RECOMMENDATIONS: 1) Follow up biopsy results and repeat the colonoscopy in 5-7 years.  REPEAT EXAM: cc:  _______________________________ eSignedCarol Ada, MD 2014-04-28 10:59 AM   CPT CODES: ICD CODES:  The ICD and CPT codes recommended by this software are interpretations from the data that the clinical staff has captured with the software.  The verification of the translation of this report to the ICD and CPT codes and modifiers is the sole responsibility of the health care institution and practicing physician where this  report was generated.  [employer]. will not be held responsible for the validity of the ICD and CPT codes included on this report.  AMA assumes no liability for data contained or not contained herein. CPT is a Designer, television/film set of the [employer].

## 2014-04-10 NOTE — Transfer of Care (Signed)
Immediate Anesthesia Transfer of Care Note  Patient: Jillian Eaton  Procedure(s) Performed: Procedure(s): COLONOSCOPY WITH PROPOFOL (N/A)  Patient Location: PACU and Endoscopy Unit  Anesthesia Type:MAC  Level of Consciousness: awake, alert , oriented and patient cooperative  Airway & Oxygen Therapy: Patient Spontanous Breathing and Patient connected to face mask oxygen  Post-op Assessment: Report given to RN, Post -op Vital signs reviewed and stable and Patient moving all extremities  Post vital signs: Reviewed and stable  Last Vitals:  Filed Vitals:   04/10/14 1007  BP: 161/76  Temp: 36.6 C  Resp: 18    Complications: No apparent anesthesia complications

## 2014-04-10 NOTE — Anesthesia Preprocedure Evaluation (Addendum)
Anesthesia Evaluation  Patient identified by MRN, date of birth, ID band Patient awake    Reviewed: Allergy & Precautions, H&P , Patient's Chart, lab work & pertinent test results, reviewed documented beta blocker date and time   Airway Mallampati: II  TM Distance: >3 FB Neck ROM: full    Dental no notable dental hx.    Pulmonary shortness of breath, at rest and Long-Term Oxygen Therapy,  breath sounds clear to auscultation  Pulmonary exam normal       Cardiovascular hypertension, On Medications Rhythm:regular Rate:Normal     Neuro/Psych    GI/Hepatic   Endo/Other  Morbid obesity  Renal/GU      Musculoskeletal   Abdominal   Peds  Hematology   Anesthesia Other Findings 1.   Super morbid obesity, with failure of 3 gastric bypass procedures 2.   Pulmonary hypertension - PA pressure of 51 mmHg 3.   Stable dyspnea on exertion 4.   Hypertension-controlled  Reproductive/Obstetrics                            Anesthesia Physical Anesthesia Plan  ASA: II  Anesthesia Plan: MAC   Post-op Pain Management:    Induction: Intravenous  Airway Management Planned: Mask, Natural Airway and LMA  Additional Equipment:   Intra-op Plan:   Post-operative Plan:   Informed Consent: I have reviewed the patients History and Physical, chart, labs and discussed the procedure including the risks, benefits and alternatives for the proposed anesthesia with the patient or authorized representative who has indicated his/her understanding and acceptance.   Dental Advisory Given  Plan Discussed with: CRNA and Surgeon  Anesthesia Plan Comments: (Discussed sedation and potential to need to place airway or ETT if warranted by clinical changes intra-operatively. We will start procedure as MAC.)        Anesthesia Quick Evaluation

## 2014-04-13 ENCOUNTER — Encounter (HOSPITAL_COMMUNITY): Payer: Self-pay | Admitting: Gastroenterology

## 2014-11-19 DIAGNOSIS — M75122 Complete rotator cuff tear or rupture of left shoulder, not specified as traumatic: Secondary | ICD-10-CM | POA: Insufficient documentation

## 2014-12-29 ENCOUNTER — Other Ambulatory Visit: Payer: Self-pay

## 2014-12-29 DIAGNOSIS — Z1231 Encounter for screening mammogram for malignant neoplasm of breast: Secondary | ICD-10-CM

## 2014-12-30 ENCOUNTER — Ambulatory Visit
Admission: RE | Admit: 2014-12-30 | Discharge: 2014-12-30 | Disposition: A | Discharge status: Home / Self Care | Payer: Federal, State, Local not specified - PPO | Source: Ambulatory Visit

## 2014-12-30 DIAGNOSIS — Z1231 Encounter for screening mammogram for malignant neoplasm of breast: Secondary | ICD-10-CM

## 2015-01-01 ENCOUNTER — Other Ambulatory Visit: Payer: Self-pay | Admitting: Obstetrics and Gynecology

## 2015-01-01 DIAGNOSIS — R928 Other abnormal and inconclusive findings on diagnostic imaging of breast: Secondary | ICD-10-CM

## 2015-01-12 ENCOUNTER — Ambulatory Visit
Admission: RE | Admit: 2015-01-12 | Discharge: 2015-01-12 | Disposition: A | Discharge status: Home / Self Care | Payer: Federal, State, Local not specified - PPO | Source: Ambulatory Visit | Attending: Obstetrics and Gynecology | Admitting: Obstetrics and Gynecology

## 2015-01-12 DIAGNOSIS — R928 Other abnormal and inconclusive findings on diagnostic imaging of breast: Secondary | ICD-10-CM

## 2015-04-12 DIAGNOSIS — R200 Anesthesia of skin: Secondary | ICD-10-CM | POA: Insufficient documentation

## 2015-04-12 DIAGNOSIS — N200 Calculus of kidney: Secondary | ICD-10-CM | POA: Insufficient documentation

## 2015-05-11 ENCOUNTER — Ambulatory Visit (INDEPENDENT_AMBULATORY_CARE_PROVIDER_SITE_OTHER): Payer: Federal, State, Local not specified - PPO | Admitting: Internal Medicine

## 2015-05-11 ENCOUNTER — Encounter: Payer: Self-pay | Admitting: Internal Medicine

## 2015-05-11 VITALS — BP 178/94 | HR 93 | Ht 67.0 in | Wt 387.1 lb

## 2015-05-11 DIAGNOSIS — I272 Other secondary pulmonary hypertension: Secondary | ICD-10-CM

## 2015-05-11 DIAGNOSIS — R0602 Shortness of breath: Secondary | ICD-10-CM

## 2015-05-11 DIAGNOSIS — R0609 Other forms of dyspnea: Secondary | ICD-10-CM

## 2015-05-11 DIAGNOSIS — R600 Localized edema: Secondary | ICD-10-CM | POA: Insufficient documentation

## 2015-05-11 DIAGNOSIS — I10 Essential (primary) hypertension: Secondary | ICD-10-CM

## 2015-05-11 DIAGNOSIS — Z79899 Other long term (current) drug therapy: Secondary | ICD-10-CM | POA: Diagnosis not present

## 2015-05-11 MED ORDER — FUROSEMIDE 20 MG PO TABS: 20.0000 mg | ORAL_TABLET | Freq: Every day | ORAL | Status: DC

## 2015-05-11 NOTE — Patient Instructions (Addendum)
Your physician has recommended you make the following change in your medication...  1. START furosemide 20mg  once daily  Your physician recommends that you return for lab work on Friday March 24th  Your physician has requested that you have an echocardiogram @ [address]. Echocardiography is a painless test that uses sound waves to create images of your heart. It provides your doctor with information about the size and shape of your heart and how well your heart's chambers and valves are working. This procedure takes approximately one hour. There are no restrictions for this procedure.  Your physician recommends that you schedule a follow-up appointment after your echocardiogram.

## 2015-05-11 NOTE — Progress Notes (Signed)
OFFICE NOTE  Chief Complaint:  DOE, leg swelling  Primary Care Physician: Red Christians, MD  HPI:  Jillian Eaton is a 65 year old female I saw a few weeks ago with a history of super morbid obesity, fibromyalgia and increasing shortness of breath. She had a cath in 2012 which showed normal coronaries, mildly elevating filling pressures, and diastolic pressures with a mean PA of 31. This correlated with her echocardiogram when RV systolic pressures in 0000000 were 49 on echo. A repeat echocardiogram was just performed which demonstrated a preserved LVEF of 60-65% with moderate concentric LVH. There was mild to moderate increase in pulmonary artery pressure with peak at 51, which has not significantly changed from her study 1 year ago. Although the pressures look very similar her shortness of breath has been increasing significantly, and I recommended that she wear oxygen at night since she had that at home which does seem to be helping her at least feel better during the day as I suspect she has sleep apnea. She has been tested before which was apparently negative and is considering retesting at some point in the future. Overall I think the main issue obviously is weight, and she unfortunately says that she is not a candidate for gastric bypass and therefore it is a very difficult situation.  I referred her back to her pulmonologist in [facility] for ongoing evaluation of pulmonary hypertension. She tells me that she was then referred to [facility] and saw a specialist there who did another right heart catheterization but did not recommend any medications other than better blood pressure control.   In addition she had problems with kidney stones and underwent 2 operations regarding tthis.  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  She is now dependent on thyroid medication. Unfortunately she's not been able to lose weight, but her weight is fairly stable  around 400 pounds as is her shortness of breath.  Cath in 2012:  LEFT HEART CATHETERIZATION  OPERATOR: Mali Hilty, MD, and Rolland Porter, MD  INDICATION: Dyspnea on exertion and chest pressure.  HISTORY OF PRESENT ILLNESS: Jillian Eaton is a morbidly obese (BMI greater than 82) female with a history of failed gastric bypass x2 with an increasing weight gain and increasing shortness of breath as well as new- onset dyspnea on exertion. She reports that she can only walk about 10 feet before she becomes short of breath and has chest pressure which she says get better with rest. She has numerous cardiac risk factors and given the high likelihood of false positive stress test, I have referred her for cardiac catheterization, both left and right heart as she has had elevated RVSP on echocardiogram of approximately 30 to 31 mmHg.  PROCEDURE: The patient was brought into the cardiac catheterization lab, sterilely prepped and draped in the usual fashion. The area around the right femoral artery and right brachial vein were cleansed and draped to allow an attempt at radial arterial and brachiocephalic venous access. IV was not able to be obtained prior to the procedure given her body habitus. With difficulty in assessing the vein, the ultrasound was eventually used to identify the right brachiocephalic vein, however, cannulation with needle and wire was not possible as the vein was very small in caliber. After mild local bleeding was controlled, we did turn our attention to the right femoral vein and with great difficulty using the ultrasound, the right femoral vein was accessed by  Dr. Ellyn Hack using a straight wire and a needle. After venous access was obtained, the attention was turned to the right radial artery by Dr. Ellyn Hack and simultaneous to that right heart catheterization was performed by myself without any difficulty. The right radial artery was  successfully cannulated and subsequently left coronary artery system was selectively injected with a 5-French TIG 4.0 catheter, however the right coronary artery could not be cannulated with the TIG catheter and was eventually cannulated with a JR-4 catheter. LV pressure was measured with a pigtail catheter. Estimated blood loss was about 30 mL. There were no acute complications. The patient received a total of 9 mg of Versed throughout the procedure as well as 225 mcg of fentanyl and was at no point greater than moderately sedated. She received 5000 units of heparin about 10 mL of a radial cocktail.  FINDINGS: 1. Left main - short, no disease. 2. LAD - no significant disease. 3. Left circumflex, no significant disease. 4. RCA - dominant, no disease, large-caliber vessel. 5. LVEDP = 20 mmHg. 6. RA - 12. 7. RV 38/12. 8. PA - 43/19 (31). 9. PCWP - 24. 10.TPG - 7. 11.Fick cardiac output/Fick cardiac index - 10.56/3.84. 12.Thermodilution cardiac output/thermodilution cardiac index -  6.78/2.47. 13.Aortic saturation - 94%. 14.PA saturation - 68%.  IMPRESSION: 1. No significant obstructive coronary artery disease. 2. LVEDP = 20 mmHg. 3. Borderline pulmonary venous hypertension. 4. High cardiac output.  Jillian Eaton returns today for follow-up. She reports that her shortness of breath has not significantly worsened, in fact, possibly is slightly better. She did have thyroid surgery last year at [place] and apparently underwent right heart catheterization prior to that. I do not have those records immediately available. Unfortunately she continues to maintain her weight and has not been able to lose any. She is complaining of some numbness and tingling in her feet which is likely neuropathy. She is on medication including Lyrica which she takes for fibromyalgia.  Jillian Eaton returns today for follow-up. She recently is been having more shortness of  breath and lower extremity swelling. She pointed out edema in her legs with very small blisters and some chronic venous stasis changes. She is not currently on a diuretic. She recently saw another new primary care provider with the [facility]. She was noted to be started on lisinopril 40 mg daily, which she is taking in addition to losartan 100 mg daily. The notes do not indicate from her office visit why she was started on lisinopril, but I can see through care everywhere that this was ordered by her primary care provider. She also takes amlodipine for blood pressure control. Her blood pressure was elevated initially at 178/94, but after resting came down to 120/78 and is at goal today. It is unusual, however to use both ACE inhibitors and ARBs.  PMHx:  Past Medical History  Diagnosis Date  . Asthma   . Hypertension   . Chronic headache   . Urinary incontinence, functional   . Hematuria - cause not known   . DOE (dyspnea on exertion)     2D ECHO, 02/12/2012 - EF 60-65%, moderate concentric hypertrophy  . SOB (shortness of breath)     NUCLEAR STRESS TEST, 03/02/2009 - no ischemic ST changes or symptoms  . Thyroid disease     "goiter"  . Heart murmur   . History of kidney stones     x 2 '13, '14 surgery to remove  .  Fibromyalgia     nerve pain"left side at waist level" "can't lay on that side without pain" , "HOB elevation helps"  . Anemia   . Transfusion history     10 yrs+    Past Surgical History  Procedure Laterality Date  . Cholecystectomy  1990  . 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 Tubal ligation  1986  . Cardiac catheterization  04/04/2010    No significant obstructive coronary artery disease  . Thyroidectomy    . Diagnostic laparoscopy      x2 bowel obstructions(adhesions)  . Colonoscopy w/ polypectomy    . Colonoscopy with propofol N/A 04/10/2014    Procedure: COLONOSCOPY WITH PROPOFOL;  Surgeon: Beryle Beams,  MD;  Location: [facility] ENDOSCOPY;  Service: Endoscopy;  Laterality: N/A;    FAMHx:  Family History  Problem Relation Age of Onset  . Diabetes Mother   . Epilepsy Mother   . Cancer Mother     Breast  . Hypertension Mother   . Kidney disease Father   . Diabetes Father   . Hypertension Father   . Asthma Father   . Heart disease Father   . Epilepsy Sister   . Cancer Maternal Grandmother   . Cancer Paternal Grandmother     SOCHx:   reports that she has never smoked. She has never used smokeless tobacco. She reports that she drinks alcohol. She reports that she does not use illicit drugs.  ALLERGIES:  No Known Allergies  ROS: A comprehensive review of systems was negative except for: Constitutional: positive for fatigue Respiratory: positive for dyspnea on exertion Cardiovascular: positive for lower extremity edema  HOME MEDS: Current Outpatient Prescriptions  Medication Sig Dispense Refill  . acetaminophen (TYLENOL) 650 MG CR tablet Take 650 mg by mouth every 6 (six) hours.    Marland Kitchen albuterol (PROAIR HFA) 108 (90 BASE) MCG/ACT inhaler Inhale 2 puffs into the lungs every 6 (six) hours as needed for wheezing or shortness of breath.     Marland Kitchen amLODipine (NORVASC) 5 MG tablet Take 5 mg by mouth daily.  1  . budesonide-formoterol (SYMBICORT) 160-4.5 MCG/ACT inhaler Inhale 2 puffs into the lungs 2 (two) times daily as needed (shortness of breath.).     Marland Kitchen calcium carbonate (OS-CAL) 600 MG TABS Take 600 mg by mouth 2 (two) times daily. Lunch and dinner    . celecoxib (CELEBREX) 200 MG capsule Take 200 mg by mouth every morning.     . cyanocobalamin (,VITAMIN B-12,) 1000 MCG/ML injection Inject 1,000 mcg into the skin every 30 (thirty) days.     . cyclobenzaprine (FLEXERIL) 10 MG tablet Take 1 tablet by mouth 3 (three) times daily.     . DULoxetine (CYMBALTA) 60 MG capsule Take 60 mg by mouth every evening.     . Eszopiclone (ESZOPICLONE) 3 MG TABS Take 3 mg by mouth at bedtime. Take immediately  before bedtime    . levothyroxine (SYNTHROID, LEVOTHROID) 300 MCG tablet Take 300 mcg by mouth daily before breakfast.    . lidocaine (LIDODERM) 5 % Place 1-2 patches onto the skin daily as needed. Applies to back.     Marland Kitchen lisinopril (PRINIVIL,ZESTRIL) 40 MG tablet Take 1 tablet by mouth daily.  0  . losartan (COZAAR) 100 MG tablet Take 100 mg by mouth every morning.     . mirabegron ER (MYRBETRIQ) 50 MG TB24 Take 50 mg by mouth every morning.     Marland Kitchen  Oxycodone HCl 10 MG TABS Take 10 mg by mouth every 6 (six) hours.    . pregabalin (LYRICA) 200 MG capsule Take 200 mg by mouth 3 (three) times daily.      . TOVIAZ 4 MG TB24 tablet Take 4 mg by mouth every evening.  11  . Vitamin D, Ergocalciferol, (DRISDOL) 50000 UNITS CAPS Take 50,000 Units by mouth 2 (two) times a week. Tuesday and Thursday    . furosemide (LASIX) 20 MG tablet Take 1 tablet (20 mg total) by mouth daily. 90 tablet 3   No current facility-administered medications for this visit.    LABS/IMAGING: No results found for this or any previous visit (from the past 48 hour(s)). No results found.  VITALS: BP 178/94 mmHg  Pulse 93  Ht 5\' 7"  (1.702 m)  Wt 387 lb 1.6 oz (175.587 kg)  BMI 60.61 kg/m2  EXAM: General appearance: alert and no distress Neck: no carotid bruit and no JVD Lungs: diminished breath sounds bilaterally Heart: regular rate and rhythm, S1, S2 normal, no murmur, click, rub or gallop Abdomen: massively obese, diffiult to palpate organs Extremities: edema 2+ bilaterally, LE edema with blisters Pulses: 2+ and symmetric Skin: Skin color, texture, turgor normal. No rashes or lesions Neurologic: Grossly normal Psych: Mood, affect normal  EKG: Sinus rhythm at 93  ASSESSMENT: 1. Super morbid obesity, with failure of 3 gastric bypass procedures 2. Pulmonary hypertension - PA pressure of 51 mmHg 3. Progressive DOE 4. Hypertension-controlled   PLAN: 1.   Ms. Almeda has had some worsening shortness of breath and  lower extremity swelling. I'm concerned given her obesity and prior history of pulmonary hypertension that she is developing more right heart failure. Her last echocardiogram was in 2013. I would like to reassess that echocardiogram and reassess her LV function as well as pulmonary pressures. I will start her on Lasix 20 mg daily today. She has some concerns because she's having problems with incontinence and this may certainly worsen it, however I feel that there are few if any options for her ongoing swelling. As for her combination therapy on lisinopril and losartan, her blood pressure is at goal today. I would prefer for her to be on a different agent. It may be beneficial to switch one of the 2 medicines over to a beta blocker for better heart rate control. This could effectively help her shortness of breath as well. We will address that after she returns with her echo results.  Pixie Casino, MD, [facility] Attending Cardiologist [place] [facility] 05/11/2015, 7:08 PM

## 2015-05-26 ENCOUNTER — Ambulatory Visit (HOSPITAL_COMMUNITY): Payer: Federal, State, Local not specified - PPO

## 2015-05-28 ENCOUNTER — Ambulatory Visit: Payer: Federal, State, Local not specified - PPO | Admitting: Internal Medicine

## 2015-05-31 ENCOUNTER — Other Ambulatory Visit (HOSPITAL_COMMUNITY): Payer: Federal, State, Local not specified - PPO

## 2015-06-07 ENCOUNTER — Ambulatory Visit (HOSPITAL_COMMUNITY): Payer: Federal, State, Local not specified - PPO | Attending: Cardiology

## 2015-06-08 ENCOUNTER — Telehealth (HOSPITAL_COMMUNITY): Payer: Self-pay | Admitting: *Deleted

## 2015-06-09 ENCOUNTER — Emergency Department (HOSPITAL_COMMUNITY)
Admission: EM | Admit: 2015-06-09 | Discharge: 2015-06-10 | Disposition: A | Discharge status: Home / Self Care | Payer: Federal, State, Local not specified - PPO | Attending: Emergency Medicine | Admitting: Emergency Medicine

## 2015-06-09 ENCOUNTER — Encounter (HOSPITAL_COMMUNITY): Payer: Self-pay | Admitting: Emergency Medicine

## 2015-06-09 ENCOUNTER — Emergency Department (HOSPITAL_COMMUNITY): Payer: Federal, State, Local not specified - PPO

## 2015-06-09 DIAGNOSIS — W01198A Fall on same level from slipping, tripping and stumbling with subsequent striking against other object, initial encounter: Secondary | ICD-10-CM | POA: Insufficient documentation

## 2015-06-09 DIAGNOSIS — S3992XA Unspecified injury of lower back, initial encounter: Secondary | ICD-10-CM | POA: Insufficient documentation

## 2015-06-09 DIAGNOSIS — Z79899 Other long term (current) drug therapy: Secondary | ICD-10-CM | POA: Insufficient documentation

## 2015-06-09 DIAGNOSIS — M179 Osteoarthritis of knee, unspecified: Secondary | ICD-10-CM | POA: Insufficient documentation

## 2015-06-09 DIAGNOSIS — J45909 Unspecified asthma, uncomplicated: Secondary | ICD-10-CM | POA: Diagnosis not present

## 2015-06-09 DIAGNOSIS — R011 Cardiac murmur, unspecified: Secondary | ICD-10-CM | POA: Diagnosis not present

## 2015-06-09 DIAGNOSIS — M1710 Unilateral primary osteoarthritis, unspecified knee: Secondary | ICD-10-CM

## 2015-06-09 DIAGNOSIS — Z862 Personal history of diseases of the blood and blood-forming organs and certain disorders involving the immune mechanism: Secondary | ICD-10-CM | POA: Diagnosis not present

## 2015-06-09 DIAGNOSIS — S8991XA Unspecified injury of right lower leg, initial encounter: Secondary | ICD-10-CM | POA: Insufficient documentation

## 2015-06-09 DIAGNOSIS — Y9389 Activity, other specified: Secondary | ICD-10-CM | POA: Insufficient documentation

## 2015-06-09 DIAGNOSIS — I10 Essential (primary) hypertension: Secondary | ICD-10-CM | POA: Insufficient documentation

## 2015-06-09 DIAGNOSIS — Y998 Other external cause status: Secondary | ICD-10-CM | POA: Diagnosis not present

## 2015-06-09 DIAGNOSIS — Z87442 Personal history of urinary calculi: Secondary | ICD-10-CM | POA: Insufficient documentation

## 2015-06-09 DIAGNOSIS — Y92511 Restaurant or cafe as the place of occurrence of the external cause: Secondary | ICD-10-CM | POA: Diagnosis not present

## 2015-06-09 DIAGNOSIS — Z9889 Other specified postprocedural states: Secondary | ICD-10-CM | POA: Diagnosis not present

## 2015-06-09 DIAGNOSIS — E079 Disorder of thyroid, unspecified: Secondary | ICD-10-CM | POA: Diagnosis not present

## 2015-06-09 DIAGNOSIS — Z791 Long term (current) use of non-steroidal anti-inflammatories (NSAID): Secondary | ICD-10-CM | POA: Insufficient documentation

## 2015-06-09 DIAGNOSIS — M25561 Pain in right knee: Secondary | ICD-10-CM

## 2015-06-09 DIAGNOSIS — Z7951 Long term (current) use of inhaled steroids: Secondary | ICD-10-CM | POA: Insufficient documentation

## 2015-06-09 DIAGNOSIS — S8992XA Unspecified injury of left lower leg, initial encounter: Secondary | ICD-10-CM | POA: Insufficient documentation

## 2015-06-09 DIAGNOSIS — S0990XA Unspecified injury of head, initial encounter: Secondary | ICD-10-CM | POA: Diagnosis not present

## 2015-06-09 DIAGNOSIS — G8929 Other chronic pain: Secondary | ICD-10-CM | POA: Insufficient documentation

## 2015-06-09 DIAGNOSIS — M25562 Pain in left knee: Secondary | ICD-10-CM

## 2015-06-09 DIAGNOSIS — W19XXXA Unspecified fall, initial encounter: Secondary | ICD-10-CM

## 2015-06-09 NOTE — ED Notes (Addendum)
Per EMS, patient tripped on a rug at a [employer]. Patient ambulates with walker at baseline. Patient struck her left head on a door frame. Patient c/o headache with photosensitivity and low back pain. Patient also c/o bilateral knee pain, patient reports chronic knee pain that is worsened by her fall tonight.

## 2015-06-09 NOTE — ED Notes (Signed)
Bed: [facility] Expected date:  Expected time:  Means of arrival:  Comments: EMS 65 yo female/fall-hit left side of head at a restaurant

## 2015-06-10 ENCOUNTER — Emergency Department (HOSPITAL_COMMUNITY): Payer: Federal, State, Local not specified - PPO

## 2015-06-10 MED ORDER — FENTANYL CITRATE (PF) 100 MCG/2ML IJ SOLN
50.0000 ug | Freq: Once | INTRAMUSCULAR | Status: DC
Start: 2015-06-10 — End: 2015-06-10
  Filled 2015-06-10: qty 2

## 2015-06-10 MED ORDER — OXYCODONE-ACETAMINOPHEN 5-325 MG PO TABS: 1.0000 | ORAL_TABLET | Freq: Four times a day (QID) | ORAL | Status: DC | PRN

## 2015-06-10 MED ORDER — KETOROLAC TROMETHAMINE 30 MG/ML IJ SOLN
30.0000 mg | Freq: Once | INTRAMUSCULAR | Status: DC
  Filled 2015-06-10: qty 1

## 2015-06-10 MED ORDER — KETOROLAC TROMETHAMINE 30 MG/ML IJ SOLN
30.0000 mg | Freq: Once | INTRAMUSCULAR | Status: AC
  Administered 2015-06-10: 30 mg via INTRAVENOUS
  Filled 2015-06-10: qty 1

## 2015-06-10 NOTE — ED Notes (Signed)
Patient transported to CT 

## 2015-06-10 NOTE — Discharge Instructions (Signed)
Arthritis Arthritis is a term that is commonly used to refer to joint pain or joint disease. There are more than 100 types of arthritis. CAUSES The most common cause of this condition is wear and tear of a joint. Other causes include:  Gout.  Inflammation of a joint.  An infection of a joint.  Sprains and other injuries near the joint.  A drug reaction or allergic reaction. In some cases, the cause may not be known. SYMPTOMS The main symptom of this condition is pain in the joint with movement. Other symptoms include:  Redness, swelling, or stiffness at a joint.  Warmth coming from the joint.  Fever.  Overall feeling of illness. DIAGNOSIS This condition may be diagnosed with a physical exam and tests, including:  Blood tests.  Urine tests.  Imaging tests, such as MRI, X-rays, or a CT scan. Sometimes, fluid is removed from a joint for testing. TREATMENT Treatment for this condition may involve:  Treatment of the cause, if it is known.  Rest.  Raising (elevating) the joint.  Applying cold or hot packs to the joint.  Medicines to improve symptoms and reduce inflammation.  Injections of a steroid such as cortisone into the joint to help reduce pain and inflammation. Depending on the cause of your arthritis, you may need to make lifestyle changes to reduce stress on your joint. These changes may include exercising more and losing weight. HOME CARE INSTRUCTIONS Medicines  Take over-the-counter and prescription medicines only as told by your health care provider.  Do not take aspirin to relieve pain if gout is suspected. Activities  Rest your joint if told by your health care provider. Rest is important when your disease is active and your joint feels painful, swollen, or stiff.  Avoid activities that make the pain worse. It is important to balance activity with rest.  Exercise your joint regularly with range-of-motion exercises as told by your health care  provider. Try doing low-impact exercise, such as:  Swimming.  Water aerobics.  Biking.  Walking. Joint Care  If your joint is swollen, keep it elevated if told by your health care provider.  If your joint feels stiff in the morning, try taking a warm shower.  If directed, apply heat to the joint. If you have diabetes, do not apply heat without permission from your health care provider.  Put a towel between the joint and the hot pack or heating pad.  Leave the heat on the area for 20-30 minutes.  If directed, apply ice to the joint:  Put ice in a plastic bag.  Place a towel between your skin and the bag.  Leave the ice on for 20 minutes, 2-3 times per day.  Keep all follow-up visits as told by your health care provider. This is important. SEEK MEDICAL CARE IF:  The pain gets worse.  You have a fever. SEEK IMMEDIATE MEDICAL CARE IF:  You develop severe joint pain, swelling, or redness.  Many joints become painful and swollen.  You develop severe back pain.  You develop severe weakness in your leg.  You cannot control your bladder or bowels.   This information is not intended to replace advice given to you by your health care provider. Make sure you discuss any questions you have with your health care provider.   Document Released: 03/16/2004 Document Revised: 10/28/2014 Document Reviewed: 05/04/2014 Elsevier Interactive Patient Education 2016 [place] Pain Joint pain, which is also called arthralgia, can be caused by many  things. Joint pain often goes away when you follow your health care provider's instructions for relieving pain at home. However, joint pain can also be caused by conditions that require further treatment. Common causes of joint pain include:  Bruising in the area of the joint.  Overuse of the joint.  Wear and tear on the joints that occur with aging (osteoarthritis).  Various other forms of arthritis.  A buildup of a crystal  form of uric acid in the joint (gout).  Infections of the joint (septic arthritis) or of the bone (osteomyelitis). Your health care provider may recommend medicine to help with the pain. If your joint pain continues, additional tests may be needed to diagnose your condition. HOME CARE INSTRUCTIONS Watch your condition for any changes. Follow these instructions as directed to lessen the pain that you are feeling.  Take medicines only as directed by your health care provider.  Rest the affected area for as long as your health care provider says that you should. If directed to do so, raise the painful joint above the level of your heart while you are sitting or lying down.  Do not do things that cause or worsen pain.  If directed, apply ice to the painful area:  Put ice in a plastic bag.  Place a towel between your skin and the bag.  Leave the ice on for 20 minutes, 2-3 times per day.  Wear an elastic bandage, splint, or sling as directed by your health care provider. Loosen the elastic bandage or splint if your fingers or toes become numb and tingle, or if they turn cold and blue.  Begin exercising or stretching the affected area as directed by your health care provider. Ask your health care provider what types of exercise are safe for you.  Keep all follow-up visits as directed by your health care provider. This is important. SEEK MEDICAL CARE IF:  Your pain increases, and medicine does not help.  Your joint pain does not improve within 3 days.  You have increased bruising or swelling.  You have a fever.  You lose 10 lb (4.5 kg) or more without trying. SEEK IMMEDIATE MEDICAL CARE IF:  You are not able to move the joint.  Your fingers or toes become numb or they turn cold and blue.   This information is not intended to replace advice given to you by your health care provider. Make sure you discuss any questions you have with your health care provider.   Document Released:  02/06/2005 Document Revised: 02/27/2014 Document Reviewed: 11/18/2013 Elsevier Interactive Patient Education [employer].

## 2015-06-10 NOTE — ED Notes (Signed)
Priovider at bedside. Pt now alert and oriented x 4 at this time. Answering questions appropriately at this time.

## 2015-06-10 NOTE — ED Notes (Signed)
Pt reports understanding of discharge information. No questions at time of discharge 

## 2015-06-10 NOTE — ED Provider Notes (Signed)
CSN: [id number]     Arrival date & time 06/09/15  2314 History   First MD Initiated Contact with Patient 06/09/15 2344     Chief Complaint  Patient presents with  . Fall    headache, low back pain     (Consider location/radiation/quality/duration/timing/severity/associated sxs/prior Treatment) HPI   Patient who is super-morbidly obese presents by EMS with multiple medical issues presented to the emergency department with complaints of a fall, landing on her bilateral knees and hitting her head, endorses a headache but no LOC or neck pain. She uses a walker and nasal cannula for oxygen dependency at home. She has chronic pain and fibromyalgia for which she takes  Cymbalta, Flexeril, Celebrex and Tylenol at home for. She accidentally tripped at the restaurant and hit her head on the door frame. Denies having any bleeding or deformities.  PCP: Red Christians, MD Alinna G Schindler is a 65 y.o.  female  ROS: The patient denies diaphoresis, fever, weakness (general or focal), confusion, change of vision,  dysphagia, aphagia, shortness of breath,  abdominal pains, nausea, vomiting, diarrhea, lower extremity swelling, rash, neck pain, chest pain   Past Medical History  Diagnosis Date  . Asthma   . Hypertension   . Chronic headache   . Urinary incontinence, functional   . Hematuria - cause not known   . DOE (dyspnea on exertion)     2D ECHO, 02/12/2012 - EF 60-65%, moderate concentric hypertrophy  . SOB (shortness of breath)     NUCLEAR STRESS TEST, 03/02/2009 - no ischemic ST changes or symptoms  . Thyroid disease     "goiter"  . Heart murmur   . History of kidney stones     x 2 '13, '14 surgery to remove  . Fibromyalgia     nerve pain"left side at waist level" "can't lay on that side without pain" , "HOB elevation helps"  . Anemia   . Transfusion history     10 yrs+   Past Surgical History  Procedure Laterality Date  . Cholecystectomy  1990  . 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 Tubal ligation  1986  . Cardiac catheterization  04/04/2010    No significant obstructive coronary artery disease  . Thyroidectomy    . Diagnostic laparoscopy      x2 bowel obstructions(adhesions)  . Colonoscopy w/ polypectomy    . Colonoscopy with propofol N/A 04/10/2014    Procedure: COLONOSCOPY WITH PROPOFOL;  Surgeon: Beryle Beams, MD;  Location: [facility];  Service: Endoscopy;  Laterality: N/A;   Family History  Problem Relation Age of Onset  . Diabetes Mother   . Epilepsy Mother   . Cancer Mother     Breast  . Hypertension Mother   . Kidney disease Father   . Diabetes Father   . Hypertension Father   . Asthma Father   . Heart disease Father   . Epilepsy Sister   . Cancer Maternal Grandmother   . Cancer Paternal Grandmother    Social History  Substance Use Topics  . Smoking status: Never Smoker   . Smokeless tobacco: Never Used  . Alcohol Use: Yes     Comment: wine occ   OB History    No data available     Review of Systems  Review of Systems All other systems negative except as documented in the HPI. All pertinent positives and negatives as reviewed in the HPI.  Allergies  Review of patient's allergies indicates no known allergies.  Home Medications   Prior to Admission medications   Medication Sig Start Date End Date Taking? Authorizing Provider  acetaminophen (TYLENOL) 650 MG CR tablet Take 650 mg by mouth every 6 (six) hours.    Historical Provider, MD  albuterol (PROAIR HFA) 108 (90 BASE) MCG/ACT inhaler Inhale 2 puffs into the lungs every 6 (six) hours as needed for wheezing or shortness of breath.     Historical Provider, MD  amLODipine (NORVASC) 5 MG tablet Take 5 mg by mouth daily. 05/07/15   Historical Provider, MD  budesonide-formoterol (SYMBICORT) 160-4.5 MCG/ACT inhaler Inhale 2 puffs into the lungs 2 (two) times daily as needed (shortness of breath.).     Tanda Rockers, MD  calcium  carbonate (OS-CAL) 600 MG TABS Take 600 mg by mouth 2 (two) times daily. Lunch and dinner    Historical Provider, MD  celecoxib (CELEBREX) 200 MG capsule Take 200 mg by mouth every morning.     Historical Provider, MD  cyanocobalamin (,VITAMIN B-12,) 1000 MCG/ML injection Inject 1,000 mcg into the skin every 30 (thirty) days.  02/12/12   Historical Provider, MD  cyclobenzaprine (FLEXERIL) 10 MG tablet Take 1 tablet by mouth 3 (three) times daily.  12/25/12   Historical Provider, MD  DULoxetine (CYMBALTA) 60 MG capsule Take 60 mg by mouth every evening.     Historical Provider, MD  Eszopiclone (ESZOPICLONE) 3 MG TABS Take 3 mg by mouth at bedtime. Take immediately before bedtime    Historical Provider, MD  furosemide (LASIX) 20 MG tablet Take 1 tablet (20 mg total) by mouth daily. 05/11/15   Pixie Casino, MD  levothyroxine (SYNTHROID, LEVOTHROID) 300 MCG tablet Take 300 mcg by mouth daily before breakfast.    Historical Provider, MD  lidocaine (LIDODERM) 5 % Place 1-2 patches onto the skin daily as needed. Applies to back.     Historical Provider, MD  lisinopril (PRINIVIL,ZESTRIL) 40 MG tablet Take 1 tablet by mouth daily. 05/06/15   Historical Provider, MD  losartan (COZAAR) 100 MG tablet Take 100 mg by mouth every morning.     Historical Provider, MD  mirabegron ER (MYRBETRIQ) 50 MG TB24 Take 50 mg by mouth every morning.     Historical Provider, MD  Oxycodone HCl 10 MG TABS Take 10 mg by mouth every 6 (six) hours.    Historical Provider, MD  oxyCODONE-acetaminophen (PERCOCET/ROXICET) 5-325 MG tablet Take 1 tablet by mouth every 6 (six) hours as needed for severe pain. 06/10/15   Monique Gift Carlota Raspberry, PA-C  pregabalin (LYRICA) 200 MG capsule Take 200 mg by mouth 3 (three) times daily.      Historical Provider, MD  TOVIAZ 4 MG TB24 tablet Take 4 mg by mouth every evening. 04/06/15   Historical Provider, MD  Vitamin D, Ergocalciferol, (DRISDOL) 50000 UNITS CAPS Take 50,000 Units by mouth 2 (two) times a week.  Tuesday and Thursday    Historical Provider, MD   BP 126/67 mmHg  Pulse 68  Temp(Src) 98.3 F (36.8 C) (Oral)  Resp 18  SpO2 96% Physical Exam  Constitutional: She appears well-developed and well-nourished. No distress.  HENT:  Head: Normocephalic and atraumatic.  Right Ear: Tympanic membrane and ear canal normal.  Left Ear: Tympanic membrane and ear canal normal.  Nose: Nose normal.  Mouth/Throat: Uvula is midline, oropharynx is clear and moist and mucous membranes are normal.  Eyes: Pupils are equal, round, and reactive to light.  Neck: Normal  range of motion. Neck supple.  Cardiovascular: Normal rate and regular rhythm.   Pulmonary/Chest: Effort normal.  Abdominal: Soft.  No signs of abdominal distention  Musculoskeletal:  Exam limited by body habitus. Bilateral knee tenderness to exam. No signs of bruising or deformity. Pt has decreased mobility at baseline due to size and chronic pain.  Neurological: She is alert.  Cranial nerves grossly intact on exam. Pt alert and oriented x 3 Upper and lower extremity strength is symmetrical and physiologic Normal muscular tone No facial droop   Skin: Skin is warm and dry. No rash noted.  Nursing note and vitals reviewed.   ED Course  Procedures (including critical care time) Labs Review Labs Reviewed - No data to display  Imaging Review Dg Knee Complete 4 Views Left  06/10/2015  CLINICAL DATA:  Trip and fall injury with pain in both knees, worse on the right. EXAM: LEFT KNEE - COMPLETE 4+ VIEW; RIGHT KNEE - COMPLETE 4+ VIEW COMPARISON:  None. FINDINGS: Moderately severe degenerative changes in the right knee with medial greater than lateral compartment narrowing, near bone on bone appearance of the medial compartment, and tricompartment osteophyte formation. No evidence of acute fracture or dislocation in the right knee. No significant effusion. Soft tissues are unremarkable. 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 No acute fracture or dislocation. Electronically Signed   By: Lucienne Capers M.D.   On: 06/10/2015 00:30   Dg Knee Complete 4 Views Right  06/10/2015  CLINICAL DATA:  Trip and fall injury with pain in both knees, worse on the right. EXAM: LEFT KNEE - COMPLETE 4+ VIEW; RIGHT KNEE - COMPLETE 4+ VIEW COMPARISON:  None. FINDINGS: Moderately severe degenerative changes in the right knee with medial greater than lateral compartment narrowing, near bone on bone appearance of the medial compartment, and tricompartment osteophyte formation. No evidence of acute fracture or dislocation in the right knee. No significant effusion. Soft tissues are unremarkable. IMPRESSION: Tricompartment degenerative changes in the right knee. No acute fracture or dislocation. Electronically Signed   By: Lucienne Capers M.D.   On: 06/10/2015 00:30   I have personally reviewed and evaluated these images and lab results as part of my medical decision-making.   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 She has a walker at home and says that despite the pain she feel like she will be okay to go home. She plans to drive home and therefore no narcotic medications were given after discussing this with her, per the patients choice. Will give her a prescription for stronger medications at home.  She endorses hitting her head and has had a normal head CT. She is alert and oriented x 3 and feels comfortable going home at this time. I recommend she follow-up with her PCP or return to the ER as needed if her symptoms change or worsen.  Filed Vitals:   06/10/15 0315 06/10/15 0414  BP: 108/61 126/67  Pulse: 78 68  Temp:    Resp: 18 [address] Annamary Carolin 06/10/15 2022  April Palumbo, MD 06/10/15 2356

## 2015-06-11 ENCOUNTER — Ambulatory Visit: Payer: Federal, State, Local not specified - PPO | Admitting: Internal Medicine

## 2015-06-22 ENCOUNTER — Other Ambulatory Visit: Payer: Self-pay

## 2015-06-22 ENCOUNTER — Ambulatory Visit (HOSPITAL_COMMUNITY): Payer: Federal, State, Local not specified - PPO | Attending: Internal Medicine

## 2015-06-22 DIAGNOSIS — R600 Localized edema: Secondary | ICD-10-CM | POA: Diagnosis not present

## 2015-06-22 DIAGNOSIS — I272 Other secondary pulmonary hypertension: Secondary | ICD-10-CM | POA: Diagnosis not present

## 2015-06-22 DIAGNOSIS — R0602 Shortness of breath: Secondary | ICD-10-CM

## 2015-06-22 DIAGNOSIS — I071 Rheumatic tricuspid insufficiency: Secondary | ICD-10-CM | POA: Insufficient documentation

## 2015-06-22 DIAGNOSIS — I253 Aneurysm of heart: Secondary | ICD-10-CM | POA: Diagnosis not present

## 2015-06-22 DIAGNOSIS — I119 Hypertensive heart disease without heart failure: Secondary | ICD-10-CM | POA: Insufficient documentation

## 2015-06-22 DIAGNOSIS — R0600 Dyspnea, unspecified: Secondary | ICD-10-CM | POA: Diagnosis present

## 2015-07-02 ENCOUNTER — Ambulatory Visit (INDEPENDENT_AMBULATORY_CARE_PROVIDER_SITE_OTHER): Payer: Federal, State, Local not specified - PPO | Admitting: Internal Medicine

## 2015-07-02 ENCOUNTER — Encounter: Payer: Self-pay | Admitting: Internal Medicine

## 2015-07-02 VITALS — BP 166/96 | HR 93 | Ht 67.0 in | Wt 391.0 lb

## 2015-07-02 DIAGNOSIS — I10 Essential (primary) hypertension: Secondary | ICD-10-CM

## 2015-07-02 DIAGNOSIS — R0600 Dyspnea, unspecified: Secondary | ICD-10-CM | POA: Diagnosis not present

## 2015-07-02 DIAGNOSIS — I2721 Secondary pulmonary arterial hypertension: Secondary | ICD-10-CM

## 2015-07-02 DIAGNOSIS — I272 Other secondary pulmonary hypertension: Secondary | ICD-10-CM

## 2015-07-02 DIAGNOSIS — R0609 Other forms of dyspnea: Secondary | ICD-10-CM | POA: Diagnosis not present

## 2015-07-02 NOTE — Progress Notes (Signed)
OFFICE NOTE  Chief Complaint:  Follow-up echo  Primary Care Physician: Red Christians, MD  HPI:  Jillian Eaton is a 65 year old female I saw a few weeks ago with a history of super morbid obesity, fibromyalgia and increasing shortness of breath. She had a cath in 2012 which showed normal coronaries, mildly elevating filling pressures, and diastolic pressures with a mean PA of 31. This correlated with her echocardiogram when RV systolic pressures in 0000000 were 49 on echo. A repeat echocardiogram was just performed which demonstrated a preserved LVEF of 60-65% with moderate concentric LVH. There was mild to moderate increase in pulmonary artery pressure with peak at 51, which has not significantly changed from her study 1 year ago. Although the pressures look very similar her shortness of breath has been increasing significantly, and I recommended that she wear oxygen at night since she had that at home which does seem to be helping her at least feel better during the day as I suspect she has sleep apnea. She has been tested before which was apparently negative and is considering retesting at some point in the future. Overall I think the main issue obviously is weight, and she unfortunately says that she is not a candidate for gastric bypass and therefore it is a very difficult situation.  I referred her back to her pulmonologist in [facility] for ongoing evaluation of pulmonary hypertension. She tells me that she was then referred to [facility] and saw a specialist there who did another right heart catheterization but did not recommend any medications other than better blood pressure control.   In addition she had problems with kidney stones and underwent 2 operations regarding tthis.  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  She is now dependent on thyroid medication. Unfortunately she's not been able to lose weight, but her weight is fairly stable  around 400 pounds as is her shortness of breath.  Cath in 2012:  LEFT HEART CATHETERIZATION  OPERATOR: Mali Hilty, MD, and Rolland Porter, MD  INDICATION: Dyspnea on exertion and chest pressure.  HISTORY OF PRESENT ILLNESS: Jillian Eaton is a morbidly obese (BMI greater than 77) female with a history of failed gastric bypass x2 with an increasing weight gain and increasing shortness of breath as well as new- onset dyspnea on exertion. She reports that she can only walk about 10 feet before she becomes short of breath and has chest pressure which she says get better with rest. She has numerous cardiac risk factors and given the high likelihood of false positive stress test, I have referred her for cardiac catheterization, both left and right heart as she has had elevated RVSP on echocardiogram of approximately 30 to 31 mmHg.  PROCEDURE: The patient was brought into the cardiac catheterization lab, sterilely prepped and draped in the usual fashion. The area around the right femoral artery and right brachial vein were cleansed and draped to allow an attempt at radial arterial and brachiocephalic venous access. IV was not able to be obtained prior to the procedure given her body habitus. With difficulty in assessing the vein, the ultrasound was eventually used to identify the right brachiocephalic vein, however, cannulation with needle and wire was not possible as the vein was very small in caliber. After mild local bleeding was controlled, we did turn our attention to the right femoral vein and with great difficulty using the ultrasound, the right femoral vein was accessed by Dr.  Ellyn Hack using a straight wire and a needle. After venous access was obtained, the attention was turned to the right radial artery by Dr. Ellyn Hack and simultaneous to that right heart catheterization was performed by myself without any difficulty. The right radial artery was  successfully cannulated and subsequently left coronary artery system was selectively injected with a 5-French TIG 4.0 catheter, however the right coronary artery could not be cannulated with the TIG catheter and was eventually cannulated with a JR-4 catheter. LV pressure was measured with a pigtail catheter. Estimated blood loss was about 30 mL. There were no acute complications. The patient received a total of 9 mg of Versed throughout the procedure as well as 225 mcg of fentanyl and was at no point greater than moderately sedated. She received 5000 units of heparin about 10 mL of a radial cocktail.  FINDINGS: 1. Left main - short, no disease. 2. LAD - no significant disease. 3. Left circumflex, no significant disease. 4. RCA - dominant, no disease, large-caliber vessel. 5. LVEDP = 20 mmHg. 6. RA - 12. 7. RV 38/12. 8. PA - 43/19 (31). 9. PCWP - 24. 10.TPG - 7. 11.Fick cardiac output/Fick cardiac index - 10.56/3.84. 12.Thermodilution cardiac output/thermodilution cardiac index -  6.78/2.47. 13.Aortic saturation - 94%. 14.PA saturation - 68%.  IMPRESSION: 1. No significant obstructive coronary artery disease. 2. LVEDP = 20 mmHg. 3. Borderline pulmonary venous hypertension. 4. High cardiac output.  Jillian Eaton returns today for follow-up. She reports that her shortness of breath has not significantly worsened, in fact, possibly is slightly better. She did have thyroid surgery last year at [facility] and apparently underwent right heart catheterization prior to that. I do not have those records immediately available. Unfortunately she continues to maintain her weight and has not been able to lose any. She is complaining of some numbness and tingling in her feet which is likely neuropathy. She is on medication including Lyrica which she takes for fibromyalgia.  Jillian Eaton returns today for follow-up. She recently is been having more shortness of breath  and lower extremity swelling. She pointed out edema in her legs with very small blisters and some chronic venous stasis changes. She is not currently on a diuretic. She recently saw another new primary care provider with the [facility]. She was noted to be started on lisinopril 40 mg daily, which she is taking in addition to losartan 100 mg daily. The notes do not indicate from her office visit why she was started on lisinopril, but I can see through care everywhere that this was ordered by her primary care provider. She also takes amlodipine for blood pressure control. Her blood pressure was elevated initially at 178/94, but after resting came down to 120/78 and is at goal today. It is unusual, however to use both ACE inhibitors and ARBs.  07/02/2015  Mrs. Peine returns today for follow-up. She underwent a repeat echocardiogram for progressive dyspnea and exertion which showed a preserved LVEF of 6065% however there is moderate to severe pulmonary hypertension with an RVSP of 64 mmHg. This is increased about 10 mmHg compared to her prior study. Her mean pulmonary pressure by catheterization in 2012 was only 31 mmHg. I suspect that progressive pulmonary hypertension as a cause of her worsening shortness of breath. In fact, during recent hospitalization for surgery she required discharged with oxygen. She says she rarely uses oxygen at home but notes that she is hypoxic often when she checks her oxygen  levels. At her last office visit I recommended Lasix which she has been using sparingly. She has problems with incontinence and she does report an improvement in her swelling with it but does not notice significant change in her shortness of breath.  PMHx:  Past Medical History  Diagnosis Date  . Asthma   . Hypertension   . Chronic headache   . Urinary incontinence, functional   . Hematuria - cause not known   . DOE (dyspnea on exertion)     2D ECHO, 02/12/2012 - EF 60-65%, moderate  concentric hypertrophy  . SOB (shortness of breath)     NUCLEAR STRESS TEST, 03/02/2009 - no ischemic ST changes or symptoms  . Thyroid disease     "goiter"  . Heart murmur   . History of kidney stones     x 2 '13, '14 surgery to remove  . Fibromyalgia     nerve pain"left side at waist level" "can't lay on that side without pain" , "HOB elevation helps"  . Anemia   . Transfusion history     10 yrs+    Past Surgical History  Procedure Laterality Date  . Cholecystectomy  1990  . 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 Tubal ligation  1986  . Cardiac catheterization  04/04/2010    No significant obstructive coronary artery disease  . Thyroidectomy    . Diagnostic laparoscopy      x2 bowel obstructions(adhesions)  . Colonoscopy w/ polypectomy    . Colonoscopy with propofol N/A 04/10/2014    Procedure: COLONOSCOPY WITH PROPOFOL;  Surgeon: Beryle Beams, MD;  Location: [facility];  Service: Endoscopy;  Laterality: N/A;    FAMHx:  Family History  Problem Relation Age of Onset  . Diabetes Mother   . Epilepsy Mother   . Cancer Mother     Breast  . Hypertension Mother   . Kidney disease Father   . Diabetes Father   . Hypertension Father   . Asthma Father   . Heart disease Father   . Epilepsy Sister   . Cancer Maternal Grandmother   . Cancer Paternal Grandmother     SOCHx:   reports that she has never smoked. She has never used smokeless tobacco. She reports that she drinks alcohol. She reports that she does not use illicit drugs.  ALLERGIES:  No Known Allergies  ROS: Pertinent items noted in HPI and remainder of comprehensive ROS otherwise negative.  HOME MEDS: Current Outpatient Prescriptions  Medication Sig Dispense Refill  . acetaminophen (TYLENOL) 650 MG CR tablet Take 650 mg by mouth every 6 (six) hours.    Marland Kitchen albuterol (PROAIR HFA) 108 (90 BASE) MCG/ACT inhaler Inhale 2 puffs into the lungs every 6 (six) hours as needed  for wheezing or shortness of breath.     Marland Kitchen amLODipine (NORVASC) 5 MG tablet Take 5 mg by mouth daily.  1  . budesonide-formoterol (SYMBICORT) 160-4.5 MCG/ACT inhaler Inhale 2 puffs into the lungs 2 (two) times daily as needed (shortness of breath.).     Marland Kitchen calcium carbonate (OS-CAL) 600 MG TABS Take 600 mg by mouth 2 (two) times daily. Lunch and dinner    . celecoxib (CELEBREX) 200 MG capsule Take 200 mg by mouth every morning.     . cyanocobalamin (,VITAMIN B-12,) 1000 MCG/ML injection Inject 1,000 mcg into the skin every 30 (thirty) days.     . cyclobenzaprine (FLEXERIL) 10 MG tablet Take  1 tablet by mouth 3 (three) times daily.     . DULoxetine (CYMBALTA) 60 MG capsule Take 60 mg by mouth every evening.     . Eszopiclone (ESZOPICLONE) 3 MG TABS Take 3 mg by mouth at bedtime. Take immediately before bedtime    . furosemide (LASIX) 20 MG tablet Take 1 tablet (20 mg total) by mouth daily. 90 tablet 3  . levothyroxine (SYNTHROID, LEVOTHROID) 300 MCG tablet Take 300 mcg by mouth daily before breakfast.    . lidocaine (LIDODERM) 5 % Place 1-2 patches onto the skin daily as needed. Applies to back.     Marland Kitchen lisinopril (PRINIVIL,ZESTRIL) 40 MG tablet Take 1 tablet by mouth daily.  0  . losartan (COZAAR) 100 MG tablet Take 100 mg by mouth every morning.     . mirabegron ER (MYRBETRIQ) 50 MG TB24 Take 50 mg by mouth every morning.     Marland Kitchen oxyCODONE-acetaminophen (PERCOCET/ROXICET) 5-325 MG tablet Take 1 tablet by mouth every 6 (six) hours as needed for severe pain. 6 tablet 0  . pregabalin (LYRICA) 200 MG capsule Take 200 mg by mouth 3 (three) times daily.      . TOVIAZ 4 MG TB24 tablet Take 4 mg by mouth every evening.  11  . Vitamin D, Ergocalciferol, (DRISDOL) 50000 UNITS CAPS Take 50,000 Units by mouth 2 (two) times a week. Tuesday and Thursday     No current facility-administered medications for this visit.    LABS/IMAGING: No results found for this or any previous visit (from the past 48  hour(s)). No results found.  VITALS: BP 166/96 mmHg  Pulse 93  Ht 5\' 7"  (1.702 m)  Wt 391 lb (177.356 kg)  BMI 61.22 kg/m2  EXAM: Deferred  EKG: Deferred  ASSESSMENT: 1. Super morbid obesity, with failure of 3 gastric bypass procedures 2. Pulmonary hypertension - PA pressure of 64 mmHg, normal LV systolic function 3. Progressive DOE 4. Hypertension-controlled   PLAN: 1.   Ms. Gearheart has worsening pulmonary hypertension which is likely mostly pulmonary arterial hypertension. She had no improvement with Lasix and has normal left heart systolic function. This is likely due to combination of super morbid obesity and upper airway obstruction. I would like to refer to pulmonary for further evaluation pulmonary hypertension and management as she may be candidate for a pulmonary vasodilator. I've encouraged her to use her oxygen more regularly and Lasix as much as tolerated.  Follow-up with me in 6 months.  Pixie Casino, MD, [facility] Attending Cardiologist [place] 07/02/2015, 5:35 PM

## 2015-07-02 NOTE — Patient Instructions (Addendum)
You have been referred to Dr. Lake Bells [facility] Pulmonary)  Your physician wants you to follow-up in: 6 months with Dr. [name]. You will receive a reminder letter in the mail two months in advance. If you don't receive a letter, please call our office to schedule the follow-up appointment.

## 2015-08-03 ENCOUNTER — Encounter: Payer: Self-pay | Admitting: Pulmonary Disease

## 2015-08-03 ENCOUNTER — Ambulatory Visit (INDEPENDENT_AMBULATORY_CARE_PROVIDER_SITE_OTHER)
Admission: RE | Admit: 2015-08-03 | Discharge: 2015-08-03 | Disposition: A | Discharge status: Home / Self Care | Payer: Federal, State, Local not specified - PPO | Source: Ambulatory Visit | Attending: Pulmonary Disease | Admitting: Pulmonary Disease

## 2015-08-03 ENCOUNTER — Other Ambulatory Visit (INDEPENDENT_AMBULATORY_CARE_PROVIDER_SITE_OTHER): Payer: Federal, State, Local not specified - PPO

## 2015-08-03 ENCOUNTER — Ambulatory Visit (INDEPENDENT_AMBULATORY_CARE_PROVIDER_SITE_OTHER): Payer: Federal, State, Local not specified - PPO | Admitting: Pulmonary Disease

## 2015-08-03 VITALS — BP 138/88 | HR 83

## 2015-08-03 DIAGNOSIS — J9621 Acute and chronic respiratory failure with hypoxia: Secondary | ICD-10-CM | POA: Insufficient documentation

## 2015-08-03 DIAGNOSIS — I272 Other secondary pulmonary hypertension: Secondary | ICD-10-CM

## 2015-08-03 DIAGNOSIS — J9622 Acute and chronic respiratory failure with hypercapnia: Secondary | ICD-10-CM

## 2015-08-03 DIAGNOSIS — R0609 Other forms of dyspnea: Secondary | ICD-10-CM

## 2015-08-03 LAB — CBC WITH DIFFERENTIAL/PLATELET
BASOS ABS: 0 10*3/uL (ref 0.0–0.1)
Basophils Relative: 0.3 % (ref 0.0–3.0)
EOS ABS: 0.2 10*3/uL (ref 0.0–0.7)
Eosinophils Relative: 3 % (ref 0.0–5.0)
HCT: 39.7 % (ref 36.0–46.0)
HEMOGLOBIN: 12.4 g/dL (ref 12.0–15.0)
LYMPHS PCT: 18.2 % (ref 12.0–46.0)
Lymphs Abs: 0.9 10*3/uL (ref 0.7–4.0)
MCHC: 31.2 g/dL (ref 30.0–36.0)
MCV: 93.6 fl (ref 78.0–100.0)
MONO ABS: 0.4 10*3/uL (ref 0.1–1.0)
Monocytes Relative: 8.5 % (ref 3.0–12.0)
Neutro Abs: 3.6 10*3/uL (ref 1.4–7.7)
Neutrophils Relative %: 70 % (ref 43.0–77.0)
Platelets: 176 10*3/uL (ref 150.0–400.0)
RBC: 4.24 Mil/uL (ref 3.87–5.11)
RDW: 18.7 % — ABNORMAL HIGH (ref 11.5–15.5)
WBC: 5.2 10*3/uL (ref 4.0–10.5)

## 2015-08-03 NOTE — Assessment & Plan Note (Signed)
It sounds as if her asthma has been well controlled with the exception of one hospitalization in 2012. I doubt that is the primary cause of her shortness of breath or her hypertension.  Plan: Full pulmonary function test Continue inhaled therapy for now

## 2015-08-03 NOTE — Progress Notes (Signed)
   Subjective:    Patient ID: IDELIA BOYZO, female    DOB: 1950/05/10, 65 y.o.   MRN: [id number]  HPI    Review of Systems  Constitutional: Negative.   HENT: Negative.  Negative for ear pain, postnasal drip, rhinorrhea, sinus pressure, sore throat, trouble swallowing and voice change.   Eyes: Negative.   Respiratory: Positive for shortness of breath. Negative for apnea, cough, choking, chest tightness, wheezing and stridor.   Cardiovascular: Negative.  Negative for chest pain, palpitations and leg swelling.  Gastrointestinal: Negative.  Negative for nausea, vomiting, abdominal pain and abdominal distention.  Genitourinary: Negative.   Musculoskeletal: Negative.  Negative for myalgias and arthralgias.  Skin: Negative.  Negative for rash.  Allergic/Immunologic: Negative.  Negative for environmental allergies and food allergies.  Neurological: Negative.  Negative for dizziness, syncope, weakness and headaches.  Hematological: Negative.  Negative for adenopathy. Does not bruise/bleed easily.  Psychiatric/Behavioral: Negative.  Negative for sleep disturbance and agitation. The patient is not nervous/anxious.        Objective:   Physical Exam        Assessment & Plan:

## 2015-08-03 NOTE — Assessment & Plan Note (Signed)
Her echocardiogram from this year shows increasing pulmonary pressures. It's unclear to me if this is the primary cause of her dyspnea or if this is just representative of a secondary process such a severe restrictive lung disease from her obesity. I favor the latter.  Notably, there is a discrepancy between the right heart catheterization from 2012 and 2014. In 2012 it seemed that she was much more volume overloaded as her pulmonary pressures were much more elevated in the setting of an elevated wedge pressure. At [facility] she had no pulmonary hypertension.  Plan: Workup for pulmonary hypertension to include the following: Pulmonary function testing Chest x-ray Overnight oximetry test I have encouraged her to use her oxygen continuously Follow-up in 4-6 weeks

## 2015-08-03 NOTE — Progress Notes (Signed)
Subjective:    Patient ID: Jillian Eaton, female    DOB: 05/09/1950, 65 y.o.   MRN: [id number]  Synopsis: Referred in 2017 for evaluation upon her hypertension she has a past medical history significant for asthma but there are no urgent tests on file. She previously saw Dr. Melvyn Novas in our office for the same in 2012. She has morbid obesity. Right heart catheterization in 2012 showed moderate pulmonary hypertension with a markedly elevated capillary wedge pressure.  HPI Chief Complaint  Patient presents with  . Advice Only    Referred by Dr. [name] for worsening SOB X3 months.    Jillian Eaton is here to see me today for shortness of breath: > this has waxed and waned over the years > she has shortness of breath at rest recently, with just turning over in bed, getting in and out of the car she will get short of breath > she admits that she does not use oxygen > dyspnea has been a bit worse since a hospitalization after a rotator cuff surgery 10/2014.  She could not be discharged without oxygen.  She only used the oxygen for a few weeks but her dyspnea has progressed so she started using it again around May when she saw Hilty. > dyspnea has been relieved with oxygen  > no cough, no chest pain, some leg swelling  Leg swelling: > has been worse > she doesn't take her lasix due to her incontinence  Chronic hypoxemic respiratory failure > she says she has been using it "24/7" lately but she doesn't have it with her in clinic > she has been on oxygen since about 2012 she thinks after an asthma flare > admits non-compliance until lately (when she saw Hilty in 06/2015). 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 Asthma   . Hypertension   . Chronic headache   . Urinary incontinence, functional   . Hematuria - cause not known   . DOE (dyspnea on exertion)     2D ECHO, 02/12/2012 - EF 60-65%, moderate concentric hypertrophy  . SOB (shortness of breath)     NUCLEAR STRESS TEST, 03/02/2009 - no ischemic ST changes or symptoms  . Thyroid disease     "goiter"  . Heart murmur   . History of kidney stones     x 2 '13, '14 surgery to remove  . Fibromyalgia     nerve pain"left side at waist level" "can't lay on that side without pain" , "HOB elevation helps"  . Anemia   . Transfusion history     10 yrs+     Family History  Problem Relation Age of Onset  . Diabetes Mother   . Epilepsy Mother   . Cancer Mother     Breast  . Hypertension Mother   . Kidney disease Father   . Diabetes Father   . Hypertension Father   . Asthma Father   . Heart disease Father   . Epilepsy Sister   . Cancer Maternal Grandmother   . Cancer Paternal  Grandmother      Social History   Social History  . Marital Status: Married    Spouse Name: N/A  . Number of Children: 3  . Years of Education: N/A   Occupational History  . 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 Smoking status: Never Smoker   . Smokeless tobacco: Never Used  . Alcohol Use: 0.0 oz/week    0 Standard drinks or equivalent per week     Comment: wine occ  . Drug Use: No  . Sexual Activity: Not on file   Other Topics Concern  . Not on file   Social History Narrative     No Known Allergies   Outpatient Prescriptions Prior to Visit  Medication Sig Dispense Refill  . acetaminophen (TYLENOL) 650 MG CR tablet Take 650 mg by mouth every 6 (six) hours.    Marland Kitchen albuterol (PROAIR HFA) 108 (90 BASE) MCG/ACT inhaler Inhale 2 puffs into the lungs every 6 (six) hours as needed for wheezing or shortness of  breath.     Marland Kitchen amLODipine (NORVASC) 5 MG tablet Take 5 mg by mouth daily.  1  . budesonide-formoterol (SYMBICORT) 160-4.5 MCG/ACT inhaler Inhale 2 puffs into the lungs 2 (two) times daily as needed (shortness of breath.).     Marland Kitchen calcium carbonate (OS-CAL) 600 MG TABS Take 600 mg by mouth 2 (two) times daily. Lunch and dinner    . celecoxib (CELEBREX) 200 MG capsule Take 200 mg by mouth every morning.     . cyanocobalamin (,VITAMIN B-12,) 1000 MCG/ML injection Inject 1,000 mcg into the skin every 30 (thirty) days.     . cyclobenzaprine (FLEXERIL) 10 MG tablet Take 1 tablet by mouth 3 (three) times daily.     . DULoxetine (CYMBALTA) 60 MG capsule Take 60 mg by mouth every evening.     . Eszopiclone (ESZOPICLONE) 3 MG TABS Take 3 mg by mouth at bedtime. Take immediately before bedtime    . levothyroxine (SYNTHROID, LEVOTHROID) 300 MCG tablet Take 300 mcg by mouth daily before breakfast.    . losartan (COZAAR) 100 MG tablet Take 100 mg by mouth every morning.     . mirabegron ER (MYRBETRIQ) 50 MG TB24 Take 50 mg by mouth every morning.     Marland Kitchen oxyCODONE-acetaminophen (PERCOCET/ROXICET) 5-325 MG tablet Take 1 tablet by mouth every 6 (six) hours as needed for severe pain. 6 tablet 0  . pregabalin (LYRICA) 200 MG capsule Take 200 mg by mouth 3 (three) times daily.      . Vitamin D, Ergocalciferol, (DRISDOL) 50000 UNITS CAPS Take 50,000 Units by mouth 2 (two) times a week. Tuesday and Thursday    . furosemide (LASIX) 20 MG tablet Take 1 tablet (20 mg total) by mouth daily. (Patient not taking: Reported on 08/03/2015) 90 tablet 3  . lidocaine (LIDODERM) 5 % Place 1-2 patches onto the skin daily as needed. Reported on 08/03/2015    . lisinopril (PRINIVIL,ZESTRIL) 40 MG tablet Take 1 tablet by mouth daily. Reported on 08/03/2015  0  . TOVIAZ 4 MG TB24 tablet Take 4 mg by mouth every evening. Reported on 08/03/2015  11   No facility-administered medications prior to visit.       Review of Systems    Constitutional: Positive for fatigue. Negative for fever, chills, diaphoresis and appetite change.  HENT: Negative for congestion, hearing loss, nosebleeds, postnasal drip, rhinorrhea, sinus pressure, sore throat and trouble swallowing.   Eyes: Negative for discharge,  redness and visual disturbance.  Respiratory: Positive for shortness of breath. Negative for cough, choking, chest tightness and wheezing.   Cardiovascular: Positive for leg swelling. Negative for chest pain.  Gastrointestinal: Negative for nausea, abdominal pain, diarrhea, constipation and blood in stool.  Genitourinary: Negative for dysuria, frequency and hematuria.  Musculoskeletal: Negative for myalgias, joint swelling, arthralgias and neck stiffness.  Skin: Negative for color change, pallor and rash.  Neurological: Negative for dizziness, seizures, facial asymmetry, speech difficulty, light-headedness, numbness and headaches.  Hematological: Negative for adenopathy. Does not bruise/bleed easily.       Objective:   Physical Exam Filed Vitals:   08/03/15 1459  BP: 138/88  Pulse: 83  SpO2: 90%   RA  Gen: morbidly obese, no acute distress HENT: NCAT, OP clear, neck supple without masses Eyes: PERRL, EOMi Lymph: no cervical lymphadenopathy PULM: diminished air flow, few crackles bases CV: RRR, no mgr, no JVD GI: BS+, soft, nontender, no hsm Derm: no rash or skin breakdown, massive edema MSK: normal bulk and tone Neuro: A&Ox4, CN II-XII intact, strength 5/5 hands, lower extremity evaluation limited by knee pain Psyche: normal mood and affect   Dr. Lysbeth Penner records reviewed where she was seen recently for increasing pulmonary pressures on her echocardiogram. May 2016 echocardiogram shows RVSP of 64, elevated from previous, LVEF 55-60% May 2012 right heart catheterization FINDINGS: 1. Left main - short, no disease. 2. LAD - no significant disease. 3. Left circumflex, no significant disease. 4. RCA - dominant,  no disease, large-caliber vessel. 5. LVEDP = 20 mmHg. 6. RA - 12. 7. RV 38/12. 8. PA - 43/19 (31). 9. PCWP - 24. 10.TPG - 7. 11.Fick cardiac output/Fick cardiac index - 10.56/3.84. 12.Thermodilution cardiac output/thermodilution cardiac index -  6.78/2.47. 13.Aortic saturation - 94%. 14.PA saturation - 68%.  Records from Dr. Ovid Curd from 2014 [facility]) :  Right heart catheterization:  S/D Mean S/DMean    RA 6   [id number] = 10     PA3613 21   PCW12      OXIMETRY:   Site% O2 Sat    SVC70  RA68  RV70  PA70  AO95    OTHER HEMODYNAMIC INFORMATION:   Heart Rate:72   Blood Pressure:144/81/102 mm Hg   Hemoglobin:10.7 gm/dL   A-V O2 Difference: 37.6 vol %   O2 Consumption:262.6 ml/min      Cardiac Output (L/min):  Fick:6.99  Thermal: 7.6     Cardiac Index (L/min/M sq):  Fick:2.56  Thermal: 2.78 Following infusion of 500 mLs of normal saline over 10 minutes, hemodynamic   as follows: PA 44/17, mean 26 mm Hg, PCWP 14, BP 155/91/112.  No recent chest x-ray on file.  CBC    Component Value Date/Time   WBC 8.2 06/06/2013 2046   WBC 6.0 10/25/2012 1431   RBC 4.04 06/06/2013 2046   RBC 3.69* 10/25/2012 1431   HGB 11.9* 06/06/2013 2046   HGB 11.2* 10/25/2012 1431   HCT 37.7 06/06/2013 2046   HCT 34.8 10/25/2012 1431   PLT 199 06/06/2013 2046   PLT 163 10/25/2012 1431   MCV 93.3 06/06/2013 2046   MCV 94.1 10/25/2012 1431   MCH 29.5 06/06/2013  2046   MCH 30.4 10/25/2012 1431   MCHC 31.6 06/06/2013 2046   MCHC 32.3 10/25/2012 1431   RDW 15.1 06/06/2013 2046   RDW 15.5* 10/25/2012 1431   LYMPHSABS 1.1 06/06/2013 2046   LYMPHSABS 1.2 10/25/2012 1431   MONOABS 0.6 06/06/2013 2046   MONOABS 0.6 10/25/2012 1431  EOSABS 0.2 06/06/2013 2046   EOSABS 0.2 10/25/2012 1431   BASOSABS 0.0 06/06/2013 2046   BASOSABS 0.0 10/25/2012 1431        Assessment & Plan:  Asthma It sounds as if her asthma has been well controlled with the exception of one hospitalization in 2012. I doubt that is the primary cause of her shortness of breath or her hypertension.  Plan: Full pulmonary function test Continue inhaled therapy for now  Pulmonary hypertension ([place]) Her echocardiogram from this year shows increasing pulmonary pressures. It's unclear to me if this is the primary cause of her dyspnea or if this is just representative of a secondary process such a severe restrictive lung disease from her obesity. I favor the latter.  Notably, there is a discrepancy between the right heart catheterization from 2012 and 2014. In 2012 it seemed that she was much more volume overloaded as her pulmonary pressures were much more elevated in the setting of an elevated wedge pressure. At [facility] she had no pulmonary hypertension.  Plan: Workup for pulmonary hypertension to include the following: Pulmonary function testing Chest x-ray Overnight oximetry test I have encouraged her to use her oxygen continuously Follow-up in 4-6 weeks  Acute on chronic respiratory failure with hypoxemia (HCC) The cause of her chronic hypoxemia is uncertain, but the most likely cause is severe restrictive lung disease from severe obesity. However, she has not had a formal workup for this in some time. The differential diagnosis includes chronic pulmonary edema, pulmonary hypertension.  While I believe she likely has her hypertension, the likelihood of her having WHO group  1 disease is quite low.  Plan: Overnight oximetry test She was advised today to use oxygen continuously per     Current outpatient prescriptions:  .  acetaminophen (TYLENOL) 650 MG CR tablet, Take 650 mg by mouth every 6 (six) hours., Disp: , Rfl:  .  albuterol (PROAIR HFA) 108 (90 BASE) MCG/ACT inhaler, Inhale 2 puffs into the lungs every 6 (six) hours as needed for wheezing or shortness of breath. , Disp: , Rfl:  .  amLODipine (NORVASC) 5 MG tablet, Take 5 mg by mouth daily., Disp: , Rfl: 1 .  budesonide-formoterol (SYMBICORT) 160-4.5 MCG/ACT inhaler, Inhale 2 puffs into the lungs 2 (two) times daily as needed (shortness of breath.). , Disp: , Rfl:  .  calcium carbonate (OS-CAL) 600 MG TABS, Take 600 mg by mouth 2 (two) times daily. Lunch and dinner, Disp: , Rfl:  .  celecoxib (CELEBREX) 200 MG capsule, Take 200 mg by mouth every morning. , Disp: , Rfl:  .  cyanocobalamin (,VITAMIN B-12,) 1000 MCG/ML injection, Inject 1,000 mcg into the skin every 30 (thirty) days. , Disp: , Rfl:  .  cyclobenzaprine (FLEXERIL) 10 MG tablet, Take 1 tablet by mouth 3 (three) times daily. , Disp: , Rfl:  .  DULoxetine (CYMBALTA) 60 MG capsule, Take 60 mg by mouth every evening. , Disp: , Rfl:  .  Eszopiclone (ESZOPICLONE) 3 MG TABS, Take 3 mg by mouth at bedtime. Take immediately before bedtime, Disp: , Rfl:  .  levothyroxine (SYNTHROID, LEVOTHROID) 300 MCG tablet, Take 300 mcg by mouth daily before breakfast., Disp: , Rfl:  .  losartan (COZAAR) 100 MG tablet, Take 100 mg by mouth every morning. , Disp: , Rfl:  .  mirabegron ER (MYRBETRIQ) 50 MG TB24, Take 50 mg by mouth every morning. , Disp: , Rfl:  .  oxyCODONE-acetaminophen (PERCOCET/ROXICET) 5-325 MG tablet, Take 1 tablet by  mouth every 6 (six) hours as needed for severe pain., Disp: 6 tablet, Rfl: 0 .  pregabalin (LYRICA) 200 MG capsule, Take 200 mg by mouth 3 (three) times daily.  , Disp: , Rfl:  .  Vitamin D, Ergocalciferol, (DRISDOL) 50000 UNITS  CAPS, Take 50,000 Units by mouth 2 (two) times a week. Tuesday and Thursday, Disp: , Rfl:

## 2015-08-03 NOTE — Patient Instructions (Signed)
We will call you with the results of the lung function test, chest x-ray, blood work, an overnight oximetry test Use your oxygen continuously We will see you back in 4-6 weeks or sooner if needed

## 2015-08-03 NOTE — Assessment & Plan Note (Signed)
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 However, she has not had a formal workup for this in some time. The differential diagnosis includes chronic pulmonary edema, pulmonary hypertension.  While I believe she likely has her hypertension, the likelihood of her having WHO group 1 disease is quite low.  Plan: Overnight oximetry test She was advised today to use oxygen continuously per

## 2015-08-05 NOTE — Progress Notes (Signed)
I was under the impression cardiology had prescribed this (that's what she told me in clinic).  She will need to see [place] again first and get a BMET before starting lasix.

## 2015-08-13 ENCOUNTER — Telehealth: Payer: Self-pay | Admitting: Pulmonary Disease

## 2015-08-13 NOTE — Telephone Encounter (Signed)
Patient notified of lab results. Nothing further needed.  

## 2015-09-14 ENCOUNTER — Ambulatory Visit: Payer: Federal, State, Local not specified - PPO | Admitting: Pulmonary Disease

## 2015-10-19 ENCOUNTER — Ambulatory Visit (INDEPENDENT_AMBULATORY_CARE_PROVIDER_SITE_OTHER): Payer: Federal, State, Local not specified - PPO | Admitting: Pulmonary Disease

## 2015-10-19 ENCOUNTER — Other Ambulatory Visit: Payer: Federal, State, Local not specified - PPO

## 2015-10-19 ENCOUNTER — Encounter: Payer: Self-pay | Admitting: Pulmonary Disease

## 2015-10-19 ENCOUNTER — Encounter (INDEPENDENT_AMBULATORY_CARE_PROVIDER_SITE_OTHER): Payer: Federal, State, Local not specified - PPO | Admitting: Pulmonary Disease

## 2015-10-19 DIAGNOSIS — M7989 Other specified soft tissue disorders: Secondary | ICD-10-CM

## 2015-10-19 DIAGNOSIS — I272 Other secondary pulmonary hypertension: Secondary | ICD-10-CM | POA: Diagnosis not present

## 2015-10-19 DIAGNOSIS — R0609 Other forms of dyspnea: Secondary | ICD-10-CM

## 2015-10-19 LAB — PULMONARY FUNCTION TEST
DL/VA % PRED: 109 %
DL/VA: 5.66 ml/min/mmHg/L
DLCO COR % PRED: 71 %
DLCO cor: 20.22 ml/min/mmHg
DLCO unc % pred: 74 %
DLCO unc: 21.15 ml/min/mmHg
FEF 25-75 POST: 1.83 L/s
FEF 25-75 PRE: 0.81 L/s
FEF2575-%Change-Post: 127 %
FEF2575-%PRED-POST: 84 %
FEF2575-%PRED-PRE: 37 %
FEV1-%CHANGE-POST: 20 %
FEV1-%PRED-POST: 60 %
FEV1-%Pred-Pre: 50 %
FEV1-PRE: 1.14 L
FEV1-Post: 1.38 L
FEV1FVC-%Change-Post: 10 %
FEV1FVC-%PRED-PRE: 93 %
FEV6-%CHANGE-POST: 8 %
FEV6-%PRED-POST: 59 %
FEV6-%Pred-Pre: 54 %
FEV6-POST: 1.68 L
FEV6-Pre: 1.54 L
FEV6FVC-%CHANGE-POST: 0 %
FEV6FVC-%PRED-POST: 103 %
FEV6FVC-%Pred-Pre: 103 %
FVC-%CHANGE-POST: 8 %
FVC-%PRED-POST: 57 %
FVC-%Pred-Pre: 52 %
FVC-Post: 1.68 L
FVC-Pre: 1.54 L
POST FEV1/FVC RATIO: 82 %
PRE FEV1/FVC RATIO: 74 %
PRE FEV6/FVC RATIO: 100 %
Post FEV6/FVC ratio: 100 %

## 2015-10-19 NOTE — Patient Instructions (Signed)
We will call you with the results of the blood tests, it may mean that she needs to have a lower extremity ultrasound Keep taking your inhaled medicines as you're doing We will see you back in 1 year or sooner if needed

## 2015-10-19 NOTE — Assessment & Plan Note (Signed)
Strangely her [place] [facility] in 2014 did not show pulmonary hypertension despite an abnormal echo.  I'm pleased that the fact that her lung function testing today showed a normal DLCO despite moderate restrictive disease from her obesity. This suggests that there is no evidence of pulmonary arteriopathy. So in summary, I see no evidence of World Health Organization group 1 pulmonary hypertension.  Plan: Follow-up per cardiology Continue asthma treatment Advised to lose weight

## 2015-10-19 NOTE — Progress Notes (Signed)
Subjective:    Patient ID: RYILEE HISEL, female    DOB: 1950-08-23, 65 y.o.   MRN: [id number]  Synopsis: Referred in 2017 for evaluation of pulmonary hypertension she has a past medical history significant for asthma. She previously saw Dr. Melvyn Novas in our office for the same in 2012. She has morbid obesity. Right heart catheterization in 2012 showed moderate pulmonary hypertension with a markedly elevated capillary wedge pressure.  However a follow-up right heart catheterization at [facility] in 2014 showed no evidence of pulmonary hypertension.  Pulmonary function testing in August 2017 showed a normal ratio, a 20% change in FEV1 with bronchodilator 1.14-1.38 L, 60% predicted, FVC 1.68 L, total lung capacity 3.34 L, 60% predicted, DLCO 21.15 (74% predicted)  HPI Chief Complaint  Patient presents with  . Follow-up    review PFT.  pt c/o sob with exertion, denies any other breathing complaints at this time.     Glenyce says that her breathing still bothers her with exertoin.  She says that today has been OK.  She still has dyspnea with exertion but she admits to remaining sedentary.  About two months ago she had a spell where she had to use oxygen for 2-3 weeks due to increased dyspnea.  She said that this came on all of the sudden and then it passed after that.  She did not have cough or chest pain at the time.  She doesn't think that the leg swelling was worse than normal.    No bronchitis since the last visit (or even in the last few years).  She used albuterol more when she was dyspneic a few weeks ago, but only 3 times a year prior to that.     Past Medical History:  Diagnosis Date  . Anemia   . Asthma   . Chronic headache   . DOE (dyspnea on exertion)    2D ECHO, 02/12/2012 - EF 60-65%, moderate concentric hypertrophy  . Fibromyalgia    nerve pain"left side at waist level" "can't lay on that side without pain" , "HOB elevation helps"  . Heart murmur   . Hematuria - cause  not known   . History of kidney stones    x 2 '13, '14 surgery to remove  . Hypertension   . SOB (shortness of breath)    NUCLEAR STRESS TEST, 03/02/2009 - no ischemic ST changes or symptoms  . Thyroid disease    "goiter"  . Transfusion history    10 yrs+  . Urinary incontinence, functional      Family History  Problem Relation Age of Onset  . Diabetes Mother   . Epilepsy Mother   . Cancer Mother     Breast  . Hypertension Mother   . Kidney disease Father   . Diabetes Father   . Hypertension Father   . Asthma Father   . Heart disease Father   . Epilepsy Sister   . Cancer Maternal Grandmother   . Cancer Paternal Grandmother      Social History   Social History  . Marital status: Married    Spouse name: N/A  . Number of children: 3  . Years of education: N/A   Occupational History  . 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 Smoking status: Never Smoker  . Smokeless tobacco: Never Used  . Alcohol use 0.0 oz/week     Comment: wine occ  . Drug use: No  . Sexual activity: Not  on file   Other Topics Concern  . Not on file   Social History Narrative  . No narrative on file     No Known Allergies   Outpatient Medications Prior to Visit  Medication Sig Dispense Refill  . acetaminophen (TYLENOL) 650 MG CR tablet Take 650 mg by mouth every 6 (six) hours.    Marland Kitchen albuterol (PROAIR HFA) 108 (90 BASE) MCG/ACT inhaler Inhale 2 puffs into the lungs every 6 (six) hours as needed for wheezing or shortness of breath.     Marland Kitchen amLODipine (NORVASC) 5 MG tablet Take 5 mg by mouth daily.  1  . budesonide-formoterol (SYMBICORT) 160-4.5 MCG/ACT inhaler Inhale 2 puffs into the lungs 2 (two) times daily as needed (shortness of breath.).     Marland Kitchen calcium carbonate (OS-CAL) 600 MG TABS Take 600 mg by mouth 2 (two) times daily. Lunch and dinner    . celecoxib (CELEBREX) 200 MG capsule Take 200 mg by mouth every morning.     . cyanocobalamin (,VITAMIN B-12,) 1000 MCG/ML  injection Inject 1,000 mcg into the skin every 30 (thirty) days.     . cyclobenzaprine (FLEXERIL) 10 MG tablet Take 1 tablet by mouth 3 (three) times daily.     . DULoxetine (CYMBALTA) 60 MG capsule Take 60 mg by mouth every evening.     . Eszopiclone (ESZOPICLONE) 3 MG TABS Take 3 mg by mouth at bedtime. Take immediately before bedtime    . levothyroxine (SYNTHROID, LEVOTHROID) 300 MCG tablet Take 300 mcg by mouth daily before breakfast.    . losartan (COZAAR) 100 MG tablet Take 100 mg by mouth every morning.     . mirabegron ER (MYRBETRIQ) 50 MG TB24 Take 50 mg by mouth every morning.     . pregabalin (LYRICA) 200 MG capsule Take 200 mg by mouth 3 (three) times daily.      . Vitamin D, Ergocalciferol, (DRISDOL) 50000 UNITS CAPS Take 50,000 Units by mouth 2 (two) times a week. Tuesday and Thursday    . oxyCODONE-acetaminophen (PERCOCET/ROXICET) 5-325 MG tablet Take 1 tablet by mouth every 6 (six) hours as needed for severe pain. 6 tablet 0   No facility-administered medications prior to visit.        Review of Systems  Constitutional: Positive for fatigue. Negative for appetite change, chills, diaphoresis and fever.  HENT: Negative for congestion, hearing loss, nosebleeds, postnasal drip, rhinorrhea, sinus pressure, sore throat and trouble swallowing.   Eyes: Negative for discharge, redness and visual disturbance.  Respiratory: Positive for shortness of breath. Negative for cough, choking, chest tightness and wheezing.   Cardiovascular: Positive for leg swelling. Negative for chest pain.  Gastrointestinal: Negative for abdominal pain, blood in stool, constipation, diarrhea and nausea.  Genitourinary: Negative for dysuria, frequency and hematuria.  Musculoskeletal: Negative for arthralgias, joint swelling, myalgias and neck stiffness.  Skin: Negative for color change, pallor and rash.  Neurological: Negative for dizziness, seizures, facial asymmetry, speech difficulty, light-headedness,  numbness and headaches.  Hematological: Negative for adenopathy. Does not bruise/bleed easily.       Objective:   Physical Exam Vitals:   10/19/15 1327  BP: (!) 144/76  Pulse: 85  SpO2: 94%  Weight: (!) 371 lb (168.3 kg)  Height: 5\' 7"  (1.702 m)   RA  Gen: morbidly obese, smells strongly of urine HENT: OP clear, TM's clear, neck supple PULM: CTA B, normal percussion CV: RRR, no mgr, notable leg edema GI: BS+, soft, nontender Derm: no cyanosis or rash Psyche: normal  mood and affect   May 2016 echocardiogram shows RVSP of 64, elevated from previous, LVEF 55-60% May 2012 right heart catheterization FINDINGS: 1. Left main - short, no disease. 2. LAD - no significant disease. 3. Left circumflex, no significant disease. 4. RCA - dominant, no disease, large-caliber vessel. 5. LVEDP = 20 mmHg. 6. RA - 12. 7. RV 38/12. 8. PA - 43/19 (31). 9. PCWP - 24. 10.TPG - 7. 11.Fick cardiac output/Fick cardiac index - 10.56/3.84. 12.Thermodilution cardiac output/thermodilution cardiac index -  6.78/2.47. 13.Aortic saturation - 94%. 14.PA saturation - 68%.  Records from Dr. Ovid Curd from 2014 [facility]) :  Right heart catheterization:  S/D Mean S/DMean    RA 6   [id number] = 10     PA3613 21   PCW12      OXIMETRY:   Site% O2 Sat    SVC70  RA68  RV70  PA70  AO95    OTHER HEMODYNAMIC INFORMATION:   Heart Rate:72   Blood Pressure:144/81/102 mm Hg   Hemoglobin:10.7 gm/dL   A-V O2 Difference: 37.6 vol %   O2  Consumption:262.6 ml/min      Cardiac Output (L/min):  Fick:6.99  Thermal: 7.6     Cardiac Index (L/min/M sq):  Fick:2.56  Thermal: 2.78 Following infusion of 500 mLs of normal saline over 10 minutes, hemodynamic   as follows: PA 44/17, mean 26 mm Hg, PCWP 14, BP 155/91/112.   CBC    Component Value Date/Time   WBC 5.2 08/03/2015 1603   RBC 4.24 08/03/2015 1603   HGB 12.4 08/03/2015 1603   HGB 11.2 (L) 10/25/2012 1431   HCT 39.7 08/03/2015 1603   HCT 34.8 10/25/2012 1431   PLT 176.0 08/03/2015 1603   PLT 163 10/25/2012 1431   MCV 93.6 08/03/2015 1603   MCV 94.1 10/25/2012 1431   MCH 29.5 06/06/2013 2046   MCHC 31.2 08/03/2015 1603   RDW 18.7 (H) 08/03/2015 1603   RDW 15.5 (H) 10/25/2012 1431   LYMPHSABS 0.9 08/03/2015 1603   LYMPHSABS 1.2 10/25/2012 1431   MONOABS 0.4 08/03/2015 1603   MONOABS 0.6 10/25/2012 1431   EOSABS 0.2 08/03/2015 1603   EOSABS 0.2 10/25/2012 1431   BASOSABS 0.0 08/03/2015 1603   BASOSABS 0.0 10/25/2012 1431        Assessment & Plan:  Leg swelling I believe this is due to her diastolic heart failure, but she had an otherwise unexplained spell of dyspnea and hypoxemia about 6 weeks ago.  This raises my concern for thromboembolism.  Fortunately her dyspnea has resolved.  Plan: Check d-dimer if positive then lower ext doppler  Pulmonary hypertension ([place]) Strangely her RHC at [facility] in 2014 did not show pulmonary hypertension despite an abnormal echo.  I'm pleased that the fact that her lung function testing today showed a normal DLCO despite moderate restrictive disease from her obesity. This suggests that there is no evidence of pulmonary arteriopathy. So in summary, I see no evidence of World Health Organization group 1 pulmonary hypertension.  Plan: Follow-up per cardiology Continue asthma treatment Advised to lose weight > 50% of today's 26 minute visit face to  face   Current Outpatient Prescriptions:  .  acetaminophen (TYLENOL) 650 MG CR tablet, Take 650 mg by mouth every 6 (six) hours., Disp: , Rfl:  .  albuterol (PROAIR HFA) 108 (90 BASE) MCG/ACT inhaler, Inhale 2 puffs into the lungs every 6 (six) hours as needed for wheezing or shortness of breath. , Disp: , Rfl:  .  amLODipine (NORVASC) 5 MG tablet, Take 5 mg by mouth daily., Disp: , Rfl: 1 .  budesonide-formoterol (SYMBICORT) 160-4.5 MCG/ACT inhaler, Inhale 2 puffs into the lungs 2 (two) times daily as needed (shortness of breath.). , Disp: , Rfl:  .  calcium carbonate (OS-CAL) 600 MG TABS, Take 600 mg by mouth 2 (two) times daily. Lunch and dinner, Disp: , Rfl:  .  celecoxib (CELEBREX) 200 MG capsule, Take 200 mg by mouth every morning. , Disp: , Rfl:  .  cyanocobalamin (,VITAMIN B-12,) 1000 MCG/ML injection, Inject 1,000 mcg into the skin every 30 (thirty) days. , Disp: , Rfl:  .  cyclobenzaprine (FLEXERIL) 10 MG tablet, Take 1 tablet by mouth 3 (three) times daily. , Disp: , Rfl:  .  DULoxetine (CYMBALTA) 60 MG capsule, Take 60 mg by mouth every evening. , Disp: , Rfl:  .  Eszopiclone (ESZOPICLONE) 3 MG TABS, Take 3 mg by mouth at bedtime. Take immediately before bedtime, Disp: , Rfl:  .  levothyroxine (SYNTHROID, LEVOTHROID) 300 MCG tablet, Take 300 mcg by mouth daily before breakfast., Disp: , Rfl:  .  losartan (COZAAR) 100 MG tablet, Take 100 mg by mouth every morning. , Disp: , Rfl:  .  mirabegron ER (MYRBETRIQ) 50 MG TB24, Take 50 mg by mouth every morning. , Disp: , Rfl:  .  oxyCODONE-acetaminophen (PERCOCET) 10-325 MG tablet, Take 1 tablet by mouth every 4 (four) hours as needed for pain., Disp: , Rfl:  .  pregabalin (LYRICA) 200 MG capsule, Take 200 mg by mouth 3 (three) times daily.  , Disp: , Rfl:  .  Vitamin D, Ergocalciferol, (DRISDOL) 50000 UNITS CAPS, Take 50,000 Units by mouth 2 (two) times a week. Tuesday and Thursday, Disp: , Rfl:

## 2015-10-19 NOTE — Assessment & Plan Note (Signed)
I believe this is due to her diastolic heart failure, but she had an otherwise unexplained spell of dyspnea and hypoxemia about 6 weeks ago.  This raises my concern for thromboembolism.  Fortunately her dyspnea has resolved.  Plan: Check d-dimer if positive then lower ext doppler

## 2015-10-20 ENCOUNTER — Other Ambulatory Visit: Payer: Self-pay | Admitting: Pulmonary Disease

## 2015-10-20 ENCOUNTER — Other Ambulatory Visit: Payer: Self-pay

## 2015-10-20 ENCOUNTER — Telehealth: Payer: Self-pay | Admitting: Pulmonary Disease

## 2015-10-20 DIAGNOSIS — M7989 Other specified soft tissue disorders: Secondary | ICD-10-CM

## 2015-10-20 DIAGNOSIS — R7989 Other specified abnormal findings of blood chemistry: Secondary | ICD-10-CM

## 2015-10-20 LAB — D-DIMER, QUANTITATIVE: D-Dimer, Quant: 1.69 ug{FEU}/mL — ABNORMAL HIGH

## 2015-10-20 NOTE — Telephone Encounter (Signed)
Pt called back and she is aware of doppler for 8/31.  Nothing further is needed.

## 2015-10-20 NOTE — Addendum Note (Signed)
Addended by: Len Blalock on: 10/20/2015 02:27 PM   Modules accepted: Orders

## 2015-10-20 NOTE — Addendum Note (Signed)
Addended by: Len Blalock on: 10/20/2015 02:32 PM   Modules accepted: Orders

## 2015-10-20 NOTE — Telephone Encounter (Signed)
LMTCB x 1 

## 2015-10-21 ENCOUNTER — Ambulatory Visit (HOSPITAL_COMMUNITY)
Admission: RE | Admit: 2015-10-21 | Discharge: 2015-10-21 | Disposition: A | Discharge status: Home / Self Care | Payer: Federal, State, Local not specified - PPO | Source: Ambulatory Visit | Attending: Cardiology | Admitting: Cardiology

## 2015-10-21 DIAGNOSIS — M7989 Other specified soft tissue disorders: Secondary | ICD-10-CM | POA: Diagnosis not present

## 2015-10-21 DIAGNOSIS — M79605 Pain in left leg: Secondary | ICD-10-CM | POA: Insufficient documentation

## 2015-10-21 DIAGNOSIS — R0602 Shortness of breath: Secondary | ICD-10-CM | POA: Diagnosis not present

## 2015-10-21 DIAGNOSIS — E669 Obesity, unspecified: Secondary | ICD-10-CM | POA: Insufficient documentation

## 2015-10-21 DIAGNOSIS — I10 Essential (primary) hypertension: Secondary | ICD-10-CM | POA: Diagnosis not present

## 2015-10-21 DIAGNOSIS — M79604 Pain in right leg: Secondary | ICD-10-CM | POA: Diagnosis not present

## 2015-10-21 DIAGNOSIS — R791 Abnormal coagulation profile: Secondary | ICD-10-CM | POA: Diagnosis not present

## 2015-11-05 DIAGNOSIS — J3801 Paralysis of vocal cords and larynx, unilateral: Secondary | ICD-10-CM | POA: Insufficient documentation

## 2016-02-21 DIAGNOSIS — I509 Heart failure, unspecified: Secondary | ICD-10-CM

## 2016-02-21 HISTORY — DX: Heart failure, unspecified: I50.9

## 2016-03-10 ENCOUNTER — Inpatient Hospital Stay (HOSPITAL_COMMUNITY)
Admission: EM | Admit: 2016-03-10 | Discharge: 2016-03-12 | DRG: 917 | Disposition: A | Discharge status: Home Health | Payer: Federal, State, Local not specified - PPO | Attending: Internal Medicine | Admitting: Internal Medicine

## 2016-03-10 ENCOUNTER — Encounter (HOSPITAL_COMMUNITY): Payer: Self-pay | Admitting: Emergency Medicine

## 2016-03-10 ENCOUNTER — Emergency Department (HOSPITAL_COMMUNITY): Payer: Federal, State, Local not specified - PPO

## 2016-03-10 DIAGNOSIS — D696 Thrombocytopenia, unspecified: Secondary | ICD-10-CM | POA: Diagnosis present

## 2016-03-10 DIAGNOSIS — J9621 Acute and chronic respiratory failure with hypoxia: Secondary | ICD-10-CM | POA: Diagnosis present

## 2016-03-10 DIAGNOSIS — N390 Urinary tract infection, site not specified: Secondary | ICD-10-CM

## 2016-03-10 DIAGNOSIS — T402X1A Poisoning by other opioids, accidental (unintentional), initial encounter: Secondary | ICD-10-CM | POA: Insufficient documentation

## 2016-03-10 DIAGNOSIS — J9600 Acute respiratory failure, unspecified whether with hypoxia or hypercapnia: Secondary | ICD-10-CM | POA: Diagnosis present

## 2016-03-10 DIAGNOSIS — J45909 Unspecified asthma, uncomplicated: Secondary | ICD-10-CM | POA: Diagnosis present

## 2016-03-10 DIAGNOSIS — S0003XA Contusion of scalp, initial encounter: Secondary | ICD-10-CM | POA: Diagnosis present

## 2016-03-10 DIAGNOSIS — Z23 Encounter for immunization: Secondary | ICD-10-CM | POA: Diagnosis not present

## 2016-03-10 DIAGNOSIS — E662 Morbid (severe) obesity with alveolar hypoventilation: Secondary | ICD-10-CM | POA: Diagnosis present

## 2016-03-10 DIAGNOSIS — M797 Fibromyalgia: Secondary | ICD-10-CM | POA: Diagnosis present

## 2016-03-10 DIAGNOSIS — J984 Other disorders of lung: Secondary | ICD-10-CM | POA: Diagnosis present

## 2016-03-10 DIAGNOSIS — J9602 Acute respiratory failure with hypercapnia: Secondary | ICD-10-CM

## 2016-03-10 DIAGNOSIS — J9622 Acute and chronic respiratory failure with hypercapnia: Secondary | ICD-10-CM | POA: Diagnosis present

## 2016-03-10 DIAGNOSIS — L8990 Pressure ulcer of unspecified site, unspecified stage: Secondary | ICD-10-CM | POA: Insufficient documentation

## 2016-03-10 DIAGNOSIS — E079 Disorder of thyroid, unspecified: Secondary | ICD-10-CM | POA: Diagnosis present

## 2016-03-10 DIAGNOSIS — W19XXXA Unspecified fall, initial encounter: Secondary | ICD-10-CM | POA: Diagnosis present

## 2016-03-10 DIAGNOSIS — E872 Acidosis: Secondary | ICD-10-CM | POA: Diagnosis present

## 2016-03-10 DIAGNOSIS — I110 Hypertensive heart disease with heart failure: Secondary | ICD-10-CM | POA: Diagnosis present

## 2016-03-10 DIAGNOSIS — G4733 Obstructive sleep apnea (adult) (pediatric): Secondary | ICD-10-CM | POA: Diagnosis present

## 2016-03-10 DIAGNOSIS — G92 Toxic encephalopathy: Secondary | ICD-10-CM

## 2016-03-10 DIAGNOSIS — Z9181 History of falling: Secondary | ICD-10-CM

## 2016-03-10 DIAGNOSIS — R5381 Other malaise: Secondary | ICD-10-CM | POA: Diagnosis present

## 2016-03-10 DIAGNOSIS — S0083XA Contusion of other part of head, initial encounter: Secondary | ICD-10-CM | POA: Insufficient documentation

## 2016-03-10 DIAGNOSIS — I5032 Chronic diastolic (congestive) heart failure: Secondary | ICD-10-CM | POA: Diagnosis present

## 2016-03-10 DIAGNOSIS — T402X4A Poisoning by other opioids, undetermined, initial encounter: Secondary | ICD-10-CM

## 2016-03-10 DIAGNOSIS — J9601 Acute respiratory failure with hypoxia: Secondary | ICD-10-CM | POA: Diagnosis not present

## 2016-03-10 DIAGNOSIS — R4182 Altered mental status, unspecified: Secondary | ICD-10-CM | POA: Diagnosis present

## 2016-03-10 DIAGNOSIS — Z6839 Body mass index (BMI) 39.0-39.9, adult: Secondary | ICD-10-CM

## 2016-03-10 DIAGNOSIS — M1710 Unilateral primary osteoarthritis, unspecified knee: Secondary | ICD-10-CM | POA: Diagnosis present

## 2016-03-10 DIAGNOSIS — I272 Pulmonary hypertension, unspecified: Secondary | ICD-10-CM | POA: Diagnosis present

## 2016-03-10 DIAGNOSIS — R55 Syncope and collapse: Secondary | ICD-10-CM

## 2016-03-10 LAB — I-STAT ARTERIAL BLOOD GAS, ED
Acid-Base Excess: 1 mmol/L (ref 0.0–2.0)
BICARBONATE: 30.6 mmol/L — AB (ref 20.0–28.0)
Bicarbonate: 30.7 mmol/L — ABNORMAL HIGH (ref 20.0–28.0)
O2 SAT: 89 %
O2 Saturation: 86 %
PCO2 ART: 83.3 mmHg — AB (ref 32.0–48.0)
PCO2 ART: 76.8 mmHg — AB (ref 32.0–48.0)
PH ART: 7.174 — AB (ref 7.350–7.450)
PO2 ART: 74 mmHg — AB (ref 83.0–108.0)
PO2 ART: 64 mmHg — AB (ref 83.0–108.0)
TCO2: 33 mmol/L (ref 0–100)
TCO2: 33 mmol/L (ref 0–100)
pH, Arterial: 7.209 — ABNORMAL LOW (ref 7.350–7.450)

## 2016-03-10 LAB — CBC WITH DIFFERENTIAL/PLATELET
Basophils Absolute: 0 10*3/uL (ref 0.0–0.1)
Basophils Relative: 1 %
EOS ABS: 0.1 10*3/uL (ref 0.0–0.7)
EOS PCT: 3 %
HCT: 43.5 % (ref 36.0–46.0)
HEMOGLOBIN: 13.9 g/dL (ref 12.0–15.0)
LYMPHS ABS: 1.2 10*3/uL (ref 0.7–4.0)
Lymphocytes Relative: 29 %
MCH: 32 pg (ref 26.0–34.0)
MCHC: 32 g/dL (ref 30.0–36.0)
MCV: 100.2 fL — ABNORMAL HIGH (ref 78.0–100.0)
MONOS PCT: 7 %
Monocytes Absolute: 0.3 10*3/uL (ref 0.1–1.0)
NEUTROS PCT: 60 %
Neutro Abs: 2.4 10*3/uL (ref 1.7–7.7)
PLATELETS: 96 10*3/uL — AB (ref 150–400)
RBC: 4.34 MIL/uL (ref 3.87–5.11)
RDW: 17 % — AB (ref 11.5–15.5)
WBC: 4 10*3/uL (ref 4.0–10.5)

## 2016-03-10 LAB — COMPREHENSIVE METABOLIC PANEL
ALBUMIN: 3.8 g/dL (ref 3.5–5.0)
ALT: 11 U/L — ABNORMAL LOW (ref 14–54)
ANION GAP: 10 (ref 5–15)
AST: 34 U/L (ref 15–41)
Alkaline Phosphatase: 92 U/L (ref 38–126)
BUN: 19 mg/dL (ref 6–20)
CALCIUM: 6.5 mg/dL — AB (ref 8.9–10.3)
CHLORIDE: 103 mmol/L (ref 101–111)
CO2: 26 mmol/L (ref 22–32)
Creatinine, Ser: 1.62 mg/dL — ABNORMAL HIGH (ref 0.44–1.00)
GFR calc non Af Amer: 33 mL/min — ABNORMAL LOW (ref >60)
GFR, EST AFRICAN AMERICAN: 38 mL/min — AB (ref >60)
GLUCOSE: 108 mg/dL — AB (ref 65–99)
POTASSIUM: 4.8 mmol/L (ref 3.5–5.1)
SODIUM: 139 mmol/L (ref 135–145)
Total Bilirubin: 0.4 mg/dL (ref 0.3–1.2)
Total Protein: 7.1 g/dL (ref 6.5–8.1)

## 2016-03-10 LAB — URINALYSIS, ROUTINE W REFLEX MICROSCOPIC
Bacteria, UA: NONE SEEN
Bilirubin Urine: NEGATIVE
GLUCOSE, UA: NEGATIVE mg/dL
Ketones, ur: NEGATIVE mg/dL
Nitrite: NEGATIVE
PROTEIN: 100 mg/dL — AB
SPECIFIC GRAVITY, URINE: 1.023 (ref 1.005–1.030)
pH: 5 (ref 5.0–8.0)

## 2016-03-10 LAB — PHOSPHORUS: Phosphorus: 6.2 mg/dL — ABNORMAL HIGH (ref 2.5–4.6)

## 2016-03-10 LAB — SALICYLATE LEVEL

## 2016-03-10 LAB — I-STAT CG4 LACTIC ACID, ED: Lactic Acid, Venous: 0.77 mmol/L (ref 0.5–1.9)

## 2016-03-10 LAB — ACETAMINOPHEN LEVEL: Acetaminophen (Tylenol), Serum: 17 ug/mL (ref 10–30)

## 2016-03-10 LAB — I-STAT TROPONIN, ED: TROPONIN I, POC: 0.01 ng/mL (ref 0.00–0.08)

## 2016-03-10 LAB — BRAIN NATRIURETIC PEPTIDE: B NATRIURETIC PEPTIDE 5: 47.8 pg/mL (ref 0.0–100.0)

## 2016-03-10 LAB — RAPID URINE DRUG SCREEN, HOSP PERFORMED
AMPHETAMINES: NOT DETECTED
BENZODIAZEPINES: NOT DETECTED
Barbiturates: NOT DETECTED
Cocaine: NOT DETECTED
OPIATES: POSITIVE — AB
Tetrahydrocannabinol: NOT DETECTED

## 2016-03-10 LAB — TSH: TSH: 68.907 u[IU]/mL — ABNORMAL HIGH (ref 0.350–4.500)

## 2016-03-10 LAB — ETHANOL

## 2016-03-10 LAB — MAGNESIUM: Magnesium: 1.4 mg/dL — ABNORMAL LOW (ref 1.7–2.4)

## 2016-03-10 LAB — CK: Total CK: 623 U/L — ABNORMAL HIGH (ref 38–234)

## 2016-03-10 MED ORDER — HEPARIN SODIUM (PORCINE) 5000 UNIT/ML IJ SOLN
5000.0000 [IU] | Freq: Three times a day (TID) | INTRAMUSCULAR | Status: DC
  Administered 2016-03-10 – 2016-03-12 (×7): 5000 [IU] via SUBCUTANEOUS
  Filled 2016-03-10 (×7): qty 1

## 2016-03-10 MED ORDER — NALOXONE HCL 2 MG/2ML IJ SOSY
1.0000 mg/h | PREFILLED_SYRINGE | INTRAMUSCULAR | Status: DC
  Administered 2016-03-10 (×2): 1 mg/h via INTRAVENOUS
  Filled 2016-03-10 (×4): qty 4

## 2016-03-10 MED ORDER — SODIUM CHLORIDE 0.9 % IV SOLN
1.0000 g | Freq: Once | INTRAVENOUS | Status: AC
  Administered 2016-03-10: 1 g via INTRAVENOUS
  Filled 2016-03-10: qty 10

## 2016-03-10 MED ORDER — ALBUTEROL SULFATE (2.5 MG/3ML) 0.083% IN NEBU: 2.5000 mg | INHALATION_SOLUTION | RESPIRATORY_TRACT | Status: DC | PRN

## 2016-03-10 MED ORDER — NALOXONE HCL 2 MG/2ML IJ SOSY
PREFILLED_SYRINGE | INTRAMUSCULAR | Status: AC
  Filled 2016-03-10: qty 2

## 2016-03-10 MED ORDER — MAGNESIUM SULFATE 2 GM/50ML IV SOLN
2.0000 g | Freq: Once | INTRAVENOUS | Status: AC
  Administered 2016-03-10: 2 g via INTRAVENOUS
  Filled 2016-03-10: qty 50

## 2016-03-10 MED ORDER — SODIUM CHLORIDE 0.9 % IV BOLUS (SEPSIS)
1000.0000 mL | Freq: Once | INTRAVENOUS | Status: AC
  Administered 2016-03-10: 1000 mL via INTRAVENOUS

## 2016-03-10 MED ORDER — NALOXONE HCL 2 MG/2ML IJ SOSY
2.0000 mg | PREFILLED_SYRINGE | Freq: Once | INTRAMUSCULAR | Status: AC
  Administered 2016-03-10: 2 mg via INTRAVENOUS

## 2016-03-10 MED ORDER — NALOXONE HCL 2 MG/2ML IJ SOSY
1.0000 mg | PREFILLED_SYRINGE | Freq: Once | INTRAMUSCULAR | Status: AC
  Administered 2016-03-10: 1 mg via INTRAVENOUS

## 2016-03-10 MED ORDER — SODIUM CHLORIDE 0.9 % IV SOLN
Freq: Once | INTRAVENOUS | Status: AC
  Administered 2016-03-10: 05:00:00 via INTRAVENOUS

## 2016-03-10 MED ORDER — PANTOPRAZOLE SODIUM 40 MG IV SOLR
40.0000 mg | Freq: Every day | INTRAVENOUS | Status: DC
  Administered 2016-03-10 – 2016-03-11 (×2): 40 mg via INTRAVENOUS
  Filled 2016-03-10 (×2): qty 40

## 2016-03-10 MED ORDER — CEFTRIAXONE SODIUM 1 G IJ SOLR
1.0000 g | Freq: Once | INTRAMUSCULAR | Status: AC
  Administered 2016-03-10: 1 g via INTRAVENOUS
  Filled 2016-03-10: qty 10

## 2016-03-10 MED ORDER — ACETAMINOPHEN 325 MG PO TABS
650.0000 mg | ORAL_TABLET | Freq: Four times a day (QID) | ORAL | Status: DC | PRN
  Administered 2016-03-10 – 2016-03-12 (×4): 650 mg via ORAL
  Filled 2016-03-10 (×4): qty 2

## 2016-03-10 NOTE — ED Notes (Addendum)
Pt requesting we call and updated her daughter and ask if she will come to the ED to visit with the patient. RN called daughter who said she would come visit later on.

## 2016-03-10 NOTE — ED Notes (Signed)
Dr. Betsey Holiday ( EDP ) notified on pt.'s elevated CK result.

## 2016-03-10 NOTE — Progress Notes (Signed)
eLink Physician-Brief Progress Note Patient Name: Jillian Eaton DOB: 01/05/1951 MRN: [id number]   Date of Service  03/10/2016  HPI/Events of Note  Admiotted with OD.  Given Narcan with response.  Now requesting tylenol for pain.  eICU Interventions  PRN order for tylenol     Intervention Category Intermediate Interventions: Pain - evaluation and management  DETERDING,ELIZABETH 03/10/2016, 9:45 PM

## 2016-03-10 NOTE — ED Notes (Addendum)
Admitting team and ED RT at  Bedside.

## 2016-03-10 NOTE — Progress Notes (Signed)
Critical care f/u eval  Interval/subjective  Placed on NIPPV earlier this am.  Per nursing was initially difficult to arouse.  Now asking to get on bedpan. Spontaneously requesting we call her daughter. On my evaluation easily opens eyes, follows commands, tells me she feels ok and is getting enough air.   Objective BP 116/79   Pulse 65   Temp 97.5 F (36.4 C) (Oral)   Resp 15   Ht 5\' 7"  (1.702 m)   Wt 250 lb (113.4 kg)   SpO2 96%   BMI 39.16 kg/m    Intake/Output Summary (Last 24 hours) at 03/10/16 1104 Last data filed at 03/10/16 0958  Gross per 24 hour  Intake             1251 ml  Output              525 ml  Net              726 ml   General appearance:  66 Year old  Female, massively obese. Easily awakens. NAD on BIPAP Eyes: anicteric sclerae icteric , moist conjunctivae; PERRL, EOMI bilaterally. Mouth:  Not able to assess w/ BIPAP mask in place Neck: Trachea midline; neck supple, no assessable JVD d/t body habitus  Lungs/chest: CTA, with normal respiratory effort and no intercostal retractions CV: RRR, no MRGs   Abdomen: obese Soft, non-tender; no masses or HSM Extremities: + peripheral edema  Skin: Normal temperature, turgor and texture; no rash, ulcers dry cracking/flaking skin Psych: Appropriate affect, alert and oriented to person, place and time  CBC Recent Labs     03/10/16  0350  WBC  4.0  HGB  13.9  HCT  43.5  PLT  96*    Coag's No results for input(s): APTT, INR in the last 72 hours.  BMET Recent Labs     03/10/16  0350  NA  139  K  4.8  CL  103  CO2  26  BUN  19  CREATININE  1.62*  GLUCOSE  108*    Electrolytes Recent Labs     03/10/16  0350  03/10/16  0706  CALCIUM  6.5*   --   MG   --   1.4*  PHOS   --   6.2*    Sepsis Markers No results for input(s): PROCALCITON, O2SATVEN in the last 72 hours.  Invalid input(s): LACTICACIDVEN  ABG Recent Labs     03/10/16  0736  03/10/16  0958  PHART  7.174*  7.209*  PCO2ART   83.3*  76.8*  PO2ART  74.0*  64.0*    Liver Enzymes Recent Labs     03/10/16  0350  AST  34  ALT  11*  ALKPHOS  92  BILITOT  0.4  ALBUMIN  3.8    Cardiac Enzymes No results for input(s): TROPONINI, PROBNP in the last 72 hours.  Glucose No results for input(s): GLUCAP in the last 72 hours.  Imaging Ct Head Wo Contrast  Result Date: 03/10/2016 CLINICAL DATA:  66 y/o  F; syncope and fall. EXAM: CT HEAD WITHOUT CONTRAST CT CERVICAL SPINE WITHOUT CONTRAST TECHNIQUE: Multidetector CT imaging of the head and cervical spine was performed following the standard protocol without intravenous contrast. Multiplanar CT image reconstructions of the cervical spine were also generated. COMPARISON:  None. FINDINGS: CT HEAD FINDINGS Brain: No evidence of acute infarction, hemorrhage, hydrocephalus, extra-axial collection or mass lesion/mass effect. Vascular: No hyperdense vessel or unexpected calcification. Skull: Large  left frontal scalp hematoma. No displaced calvarial fracture. Sinuses/Orbits: Tiny left maxillary sinus mucous retention cyst. Otherwise visualized paranasal sinuses and mastoid air cells are normally aerated. Other: None. CT CERVICAL SPINE FINDINGS Alignment: Normal. Skull base and vertebrae: No acute fracture. No primary bone lesion or focal pathologic process. Soft tissues and spinal canal: No prevertebral fluid or swelling. No visible canal hematoma. Disc levels:  Minimal cervical degenerative changes. Upper chest: Negative. 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 IMPRESSION: 1. Large left frontal scalp hematoma. No displaced calvarial fracture. 2. No acute intracranial abnormality. 3. No acute fracture or dislocation of the cervical spine. 4. 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  Electronically Signed   By: Kristine Garbe M.D.   On: 03/10/2016 04:28   Ct Cervical Spine Wo Contrast  Result Date: 03/10/2016 CLINICAL DATA:  66 y/o  F; syncope and fall. EXAM: CT HEAD WITHOUT CONTRAST CT CERVICAL SPINE WITHOUT CONTRAST TECHNIQUE: Multidetector CT imaging of the head and cervical spine was performed following the standard protocol without intravenous contrast. Multiplanar CT image reconstructions of the cervical spine were also generated. COMPARISON:  None. FINDINGS: CT HEAD FINDINGS Brain: No evidence of acute infarction, hemorrhage, hydrocephalus, extra-axial collection or mass lesion/mass effect. Vascular: No hyperdense vessel or unexpected calcification. Skull: Large left frontal scalp hematoma. No displaced calvarial fracture. Sinuses/Orbits: Tiny left maxillary sinus mucous retention cyst. Otherwise visualized paranasal sinuses and mastoid air cells are normally aerated. Other: None. CT CERVICAL SPINE FINDINGS Alignment: Normal. Skull base and vertebrae: No acute fracture. No primary bone lesion or focal pathologic process. Soft tissues and spinal canal: No prevertebral fluid or swelling. No visible canal hematoma. Disc levels:  Minimal cervical degenerative changes. Upper chest: Negative. 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 IMPRESSION: 1. Large left frontal scalp hematoma. No displaced calvarial fracture. 2. No acute intracranial abnormality. 3. No acute fracture or dislocation of the cervical spine. 4. 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 Electronically Signed   By: Kristine Garbe M.D.   On: 03/10/2016 04:28   Dg Chest Port 1 View  Result Date: 03/10/2016 CLINICAL DATA:  Shortness of breath. Patient fell in the bathroom. 08/03/2015 EXAM: PORTABLE  CHEST 1 VIEW COMPARISON:  Radiographs 08/03/2015 FINDINGS: Cardiomegaly is stable. There is vascular congestion, chronic. Diminished pulmonary edema from prior exam. Streaky bibasilar atelectasis. No evidence of pleural effusion, focal pneumonia or pneumothorax. No gross evidence of acute osseous abnormality, body habitus limits assessment. The patient is rotated to the left. IMPRESSION: Stable cardiomegaly, chronic vascular congestion. Diminished pulmonary edema from prior. No evidence of new abnormality allowing for limitations. Electronically Signed   By: Jeb Levering M.D.   On: 03/10/2016 03:51    Impression/plan Acute Encephalopathy likely polypharmacy/narcan; c/b Hypercarbia  Plan Cont narcan gtt Cont NIPPV Holding all sedating meds   Acute Hypercarbic respiratory failure  Presume OHS (h/o restrictive lung dz). ? OSA; all acutely decompensated by narcotics.  ->abg a little better but still boarderline. Clinically improved.  Plan Cont NIPPV  Chronic diastolic HF HTN  Plan Cont current rx.   AKI (? Baseline)  Plan Renal dose meds  rx HTN Avoid nephrotoxins  Strict I&O Repeat chem in am  Hypomagnesemia  Plan Replace Mg & repeat level in am   My critical care time 45 minutes  Erick Colace ACNP-BC [place] Pager # [phone] OR # [phone] if no answer

## 2016-03-10 NOTE — ED Notes (Signed)
RT at bedside placing patient on BIPAP. Pt tolerating it well.

## 2016-03-10 NOTE — ED Notes (Signed)
Called pharmacy again for Narcan drip. They state they will send it.

## 2016-03-10 NOTE — ED Notes (Signed)
Called pharmacy and informed them that the patient needs a new narcan drip prepped.

## 2016-03-10 NOTE — Progress Notes (Signed)
Responded to page for pt w/ possible prescription drug overdose brought in from home, Found at home face down among several pills/bottles, though unknown what she took. Husband called EMS, though no family came in w/ pt at 8 nor till now. At 0700, pt still in [place] [facility]. Chaplain available for f/u.   03/10/16 0600  Clinical Encounter Type  Visited With Patient not available;Health care provider  Visit Type Initial;ED  Referral From Nurse   Gerrit Heck, Chaplain

## 2016-03-10 NOTE — H&P (Signed)
PULMONARY / CRITICAL CARE MEDICINE   Name: Jillian Eaton MRN: [id number] DOB: [birth year]    ADMISSION DATE:  03/10/2016 CONSULTATION DATE:  03/10/2016  REFERRING MD:  Dr. Betsey Holiday EDP  CHIEF COMPLAINT:  Fall/Overdose  HISTORY OF PRESENT ILLNESS:   66 year old female with PMH significant for hypertension, asthma, thyroid disease, and fibromyalgia. She has a history of falls per EMS who was very familiar with going to her house. 1/19 early AM her husband heard her fall and when he got to her she was laying face down on the floor surrounded by pills. EMS was called and administered Narcan with positive response. Upon arrival to ED she was somnolent again and was given additional narcan with good response. She was started on narcan infusion and PCCM has been asked to admit.   PAST MEDICAL HISTORY :  She  has a past medical history of Fibromyalgia.  PAST SURGICAL HISTORY: She  has no past surgical history on file.  No Known Allergies  No current facility-administered medications on file prior to encounter.    No current outpatient prescriptions on file prior to encounter.    FAMILY HISTORY:  Her has no family status information on file.    SOCIAL HISTORY: She    REVIEW OF SYSTEMS:   Limited due to encephalopathy  SUBJECTIVE:    VITAL SIGNS: BP 113/76   Pulse 69   Temp 97.5 F (36.4 C) (Oral)   Resp 12   Ht 5\' 7"  (1.702 m)   Wt 113.4 kg (250 lb)   SpO2 99%   BMI 39.16 kg/m   HEMODYNAMICS:    VENTILATOR SETTINGS:    INTAKE / OUTPUT: No intake/output data recorded.  PHYSICAL EXAMINATION: General:  Morbidly obese female in NAD Neuro:  Somnolent, arouses to verbal but quickly drifts back off after a few words.  HEENT:  Hematoma L frontal scalp, PERRL, no JVD Cardiovascular:  RRR, no MRG Lungs:  Clear Abdomen:  Soft, non-tender, non-distended Musculoskeletal:  No acute deformity Skin:  Dry, grossly intact  LABS:  BMET  Recent Labs Lab 03/10/16 0350   NA 139  K 4.8  CL 103  CO2 26  BUN 19  CREATININE 1.62*  GLUCOSE 108*    Electrolytes  Recent Labs Lab 03/10/16 0350  CALCIUM 6.5*    CBC  Recent Labs Lab 03/10/16 0350  WBC 4.0  HGB 13.9  HCT 43.5  PLT 96*    Coag's No results for input(s): APTT, INR in the last 168 hours.  Sepsis Markers  Recent Labs Lab 03/10/16 0558  LATICACIDVEN 0.77    ABG No results for input(s): PHART, PCO2ART, PO2ART in the last 168 hours.  Liver Enzymes  Recent Labs Lab 03/10/16 0350  AST 34  ALT 11*  ALKPHOS 92  BILITOT 0.4  ALBUMIN 3.8    Cardiac Enzymes No results for input(s): TROPONINI, PROBNP in the last 168 hours.  Glucose No results for input(s): GLUCAP in the last 168 hours.  Imaging Ct Head Wo Contrast  Result Date: 03/10/2016 CLINICAL DATA:  66 y/o  F; syncope and fall. EXAM: CT HEAD WITHOUT CONTRAST CT CERVICAL SPINE WITHOUT CONTRAST TECHNIQUE: Multidetector CT imaging of the head and cervical spine was performed following the standard protocol without intravenous contrast. Multiplanar CT image reconstructions of the cervical spine were also generated. COMPARISON:  None. FINDINGS: CT HEAD FINDINGS Brain: No evidence of acute infarction, hemorrhage, hydrocephalus, extra-axial collection or mass lesion/mass effect. Vascular: No hyperdense vessel or unexpected calcification.  Skull: Large left frontal scalp hematoma. No displaced calvarial fracture. Sinuses/Orbits: Tiny left maxillary sinus mucous retention cyst. Otherwise visualized paranasal sinuses and mastoid air cells are normally aerated. Other: None. CT CERVICAL SPINE FINDINGS Alignment: Normal. Skull base and vertebrae: No acute fracture. No primary bone lesion or focal pathologic process. Soft tissues and spinal canal: No prevertebral fluid or swelling. No visible canal hematoma. Disc levels:  Minimal cervical degenerative changes. Upper chest: Negative. 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 IMPRESSION: 1. Large left frontal scalp hematoma. No displaced calvarial fracture. 2. No acute intracranial abnormality. 3. No acute fracture or dislocation of the cervical spine. 4. 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 Electronically Signed   By: Kristine Garbe M.D.   On: 03/10/2016 04:28   Ct Cervical Spine Wo Contrast  Result Date: 03/10/2016 CLINICAL DATA:  66 y/o  F; syncope and fall. EXAM: CT HEAD WITHOUT CONTRAST CT CERVICAL SPINE WITHOUT CONTRAST TECHNIQUE: Multidetector CT imaging of the head and cervical spine was performed following the standard protocol without intravenous contrast. Multiplanar CT image reconstructions of the cervical spine were also generated. COMPARISON:  None. FINDINGS: CT HEAD FINDINGS Brain: No evidence of acute infarction, hemorrhage, hydrocephalus, extra-axial collection or mass lesion/mass effect. Vascular: No hyperdense vessel or unexpected calcification. Skull: Large left frontal scalp hematoma. No displaced calvarial fracture. Sinuses/Orbits: Tiny left maxillary sinus mucous retention cyst. Otherwise visualized paranasal sinuses and mastoid air cells are normally aerated. Other: None. CT CERVICAL SPINE FINDINGS Alignment: Normal. Skull base and vertebrae: No acute fracture. No primary bone lesion or focal pathologic process. Soft tissues and spinal canal: No prevertebral fluid or swelling. No visible canal hematoma. Disc levels:  Minimal cervical degenerative changes. Upper chest: Negative. 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 IMPRESSION: 1. Large left frontal scalp hematoma. No displaced calvarial fracture. 2. No acute  intracranial abnormality. 3. No acute fracture or dislocation of the cervical spine. 4. 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 Electronically Signed   By: Kristine Garbe M.D.   On: 03/10/2016 04:28   Dg Chest Port 1 View  Result Date: 03/10/2016 CLINICAL DATA:  Shortness of breath. Patient fell in the bathroom. 08/03/2015 EXAM: PORTABLE CHEST 1 VIEW COMPARISON:  Radiographs 08/03/2015 FINDINGS: Cardiomegaly is stable. There is vascular congestion, chronic. Diminished pulmonary edema from prior exam. Streaky bibasilar atelectasis. No evidence of pleural effusion, focal pneumonia or pneumothorax. No gross evidence of acute osseous abnormality, body habitus limits assessment. The patient is rotated to the left. IMPRESSION: Stable cardiomegaly, chronic vascular congestion. Diminished pulmonary edema from prior. No evidence of new abnormality allowing for limitations. Electronically Signed   By: Jeb Levering M.D.   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 Suspected overdose - urine positive for opiates with response to narcan, however, several pills scattered around her. Likely polypharmacy  P:  RASS goal: 0 Continue narcan infusion May need intubation if worsens Monitor closely  PULMONARY A: At risk intubation for airway protection Restrictive lung disease (obesity related) mild on PFT  P:   Monitor closely on narcan infusion Supplemental O2 PRN nebs per home regimen  CARDIOVASCULAR A:  Hypertension Chronic diastolic CHF  P:  Telemetry Holding Norvasc, losartan while NPO  RENAL A:    Hypocalcemia  P:   Replete Ca  GASTROINTESTINAL A:   No acute issues  P:   NPO Protonix  HEMATOLOGIC A:   Thrombocytopenia  P:  Follow CBC  INFECTIOUS A:   No acute issues  P:   Follow WBC and fever curve  ENDOCRINE A:   History of thyroid disorder  P:   Assess TSH    FAMILY  - Updates: left message for husband, no response.  - Inter-disciplinary family meet or Palliative Care meeting due by:  1/25   Georgann Housekeeper, AGACNP-BC [facility] Pulmonology/Critical Care Pager [phone] or [phone] 03/10/2016 7:00 AM   Attending Note:  I have examined patient, reviewed labs, studies and notes. I have discussed the case with Jaclynn Guarneri, and I agree with the data and plans as amended above. 66 yo obese woman, hx of chronic pain / fibromyalgia, falls, HTN, asthma. She fell at home ands was apparently surrounded by her home pills. She was poorly responsive until EMS arrived to give narcan. In ED she was started on narcan gtt and woke up more. Urine screen positive for narcotics. No other reported prodrome. No apparent seizure activity. On my eval she is obese, somnolent but will wake to voice and stim. She answers questions, will move all ext. no focal weakness. She c/o generalized pain. Lungs are diminished at bases, clear superiorly. 1+ LE edema. She is protecting her airway. This appears to be acute respiratory based on inability to protect airway, acute toxic encephalopathy. Suspect narcotics given her response to narcan and her UDS. We will need to get an accurate med list to review her other meds for contribution.will continue narcan gtt as ordered.  Suspect that she will be able to avoid intubation based on current exam while on narcan. She is still at risk. ABG now with a resp acidosis. Will start BiPAP and repeat her exam, ABG in a couple hours. May yet require MV  Independent critical care time is 40 minutes.   Baltazar Apo, MD, PhD 03/10/2016, 7:28 AM [place]  Pulmonary and Critical Care [phone] or if no answer [phone]

## 2016-03-10 NOTE — Progress Notes (Signed)
eLink Physician-Brief Progress Note Patient Name: Jillian Eaton DOB: October 30, 1950 MRN: [id number]   Date of Service  03/10/2016  HPI/Events of Note  Pt awake, oriented, off Bipap. Asking to eat  eICU Interventions  Will d/c narcan drip.  PO clears ordered Change admission status to [place] 03/10/2016, 4:20 PM

## 2016-03-10 NOTE — ED Notes (Signed)
Spoke with [employer]. [employer] has pt belongings [facility]. Belongings will be released and brought to pt by 8am to room. Spoke with Magda Paganini at [phone] at [facility]. RN Olivia Mackie on floor made aware.

## 2016-03-10 NOTE — ED Provider Notes (Signed)
[facility] DEPT Provider Note   CSN: [id number] Arrival date & time: 03/10/16  [id number]  By signing my name below, I, Jillian Eaton, attest that this documentation has been prepared under the direction and in the presence of Orpah Greek, MD  Electronically Signed: Delton Eaton, ED Scribe. 03/10/16. 3:42 AM.  History   Chief Complaint Chief Complaint  Patient presents with  . Fall    Level 2   The history is provided by the EMS personnel. No language interpreter was used.   HPI Comments:  Jillian Eaton is a 66 y.o. female, with a hx of fibromyalgia, who presents to the Emergency Department, via EMS, s/p an unwitnessed fall which occurred PTA. EMS reports the pt was found faced down on the floor by husband. There were multiple pills of different medications surrounding the pt on the ground. She was given narcan en route to the ED with relief. Pt is on lasix.   Past Medical History:  Diagnosis Date  . Fibromyalgia     There are no active problems to display for this patient.   History reviewed. No pertinent surgical history.  OB History    No data available       Home Medications    Prior to Admission medications   Not on File    Family History No family history on file.  Social History Social History  Substance Use Topics  . Smoking status: Unknown If Ever Smoked  . Smokeless tobacco: Not on file  . Alcohol use Not on file     Allergies   Patient has no known allergies.   Review of Systems Review of Systems  Unable to perform ROS: Acuity of condition   Physical Exam Updated Vital Signs BP 120/83   Pulse 69   Temp 97.5 F (36.4 C) (Oral)   Resp 16   Ht 5\' 7"  (1.702 m)   Wt 250 lb (113.4 kg)   SpO2 98%   BMI 39.16 kg/m   Physical Exam  Constitutional: She appears well-developed and well-nourished. She appears lethargic. No distress.  HENT:  Right Ear: Hearing normal.  Left Ear: Hearing normal.  Nose: Nose normal.    Mouth/Throat: Oropharynx is clear and moist and mucous membranes are normal.  Large frontal vertex hematoma   Eyes: Conjunctivae and EOM are normal.  Pupils are pinpoint   Neck: Normal range of motion. Neck supple.  Cardiovascular: Regular rhythm, S1 normal and S2 normal.  Exam reveals no gallop and no friction rub.   No murmur heard. Pulmonary/Chest: Bradypnea noted. No respiratory distress. She exhibits no tenderness.  Shallow breath sounds   Abdominal: Soft. Normal appearance and bowel sounds are normal. There is no hepatosplenomegaly. There is no tenderness. There is no rebound, no guarding, no tenderness at McBurney's point and negative Murphy's sign. No hernia.  Musculoskeletal: Normal range of motion.  Neurological: She has normal strength. She appears lethargic. No cranial nerve deficit or sensory deficit. Coordination normal. GCS eye subscore is 1. GCS verbal subscore is 3. GCS motor subscore is 5.  Skin: Skin is warm, dry and intact. No rash noted. No cyanosis.  Psychiatric: She is noncommunicative.  Nursing note and vitals reviewed.   ED Treatments / Results  DIAGNOSTIC STUDIES:  Oxygen Saturation is 93% on [place], low by my interpretation.    Labs (all labs ordered are listed, but only abnormal results are displayed) Labs Reviewed  CBC WITH DIFFERENTIAL/PLATELET - Abnormal; Notable for the following:  Result Value   MCV 100.2 (*)    RDW 17.0 (*)    Platelets 96 (*)    All other components within normal limits  COMPREHENSIVE METABOLIC PANEL - Abnormal; Notable for the following:    Glucose, Bld 108 (*)    Creatinine, Ser 1.62 (*)    Calcium 6.5 (*)    ALT 11 (*)    GFR calc non Af Amer 33 (*)    GFR calc Af Amer 38 (*)    All other components within normal limits  URINALYSIS, ROUTINE W REFLEX MICROSCOPIC - Abnormal; Notable for the following:    APPearance CLOUDY (*)    Hgb urine dipstick MODERATE (*)    Protein, ur 100 (*)    Leukocytes, UA LARGE (*)     Squamous Epithelial / LPF 0-5 (*)    All other components within normal limits  RAPID URINE DRUG SCREEN, HOSP PERFORMED - Abnormal; Notable for the following:    Opiates POSITIVE (*)    All other components within normal limits  URINE CULTURE  ACETAMINOPHEN LEVEL  SALICYLATE LEVEL  BRAIN NATRIURETIC PEPTIDE  ETHANOL  I-STAT TROPOININ, ED    EKG  EKG Interpretation  Date/Time:  Friday March 10 2016 03:57:52 EST Ventricular Rate:  76 PR Interval:  166 QRS Duration: 88 QT Interval:  426 QTC Calculation: 479 R Axis:   84 Text Interpretation:  Sinus rhythm with Premature atrial complexes with Abberant conduction Otherwise normal ECG Confirmed by Jillian Skowron  MD, Jovonda Selner [phone]) on 03/10/2016 4:26:37 AM       Radiology Ct Head Wo Contrast  Result Date: 03/10/2016 CLINICAL DATA:  66 y/o  F; syncope and fall. EXAM: CT HEAD WITHOUT CONTRAST CT CERVICAL SPINE WITHOUT CONTRAST TECHNIQUE: Multidetector CT imaging of the head and cervical spine was performed following the standard protocol without intravenous contrast. Multiplanar CT image reconstructions of the cervical spine were also generated. COMPARISON:  None. FINDINGS: CT HEAD FINDINGS Brain: No evidence of acute infarction, hemorrhage, hydrocephalus, extra-axial collection or mass lesion/mass effect. Vascular: No hyperdense vessel or unexpected calcification. Skull: Large left frontal scalp hematoma. No displaced calvarial fracture. Sinuses/Orbits: Tiny left maxillary sinus mucous retention cyst. Otherwise visualized paranasal sinuses and mastoid air cells are normally aerated. Other: None. CT CERVICAL SPINE FINDINGS Alignment: Normal. Skull base and vertebrae: No acute fracture. No primary bone lesion or focal pathologic process. Soft tissues and spinal canal: No prevertebral fluid or swelling. No visible canal hematoma. Disc levels:  Minimal cervical degenerative changes. Upper chest: Negative. 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 IMPRESSION: 1. Large left frontal scalp hematoma. No displaced calvarial fracture. 2. No acute intracranial abnormality. 3. No acute fracture or dislocation of the cervical spine. 4. 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 Electronically Signed   By: Kristine Garbe M.D.   On: 03/10/2016 04:28   Ct Cervical Spine Wo Contrast  Result Date: 03/10/2016 CLINICAL DATA:  66 y/o  F; syncope and fall. EXAM: CT HEAD WITHOUT CONTRAST CT CERVICAL SPINE WITHOUT CONTRAST TECHNIQUE: Multidetector CT imaging of the head and cervical spine was performed following the standard protocol without intravenous contrast. Multiplanar CT image reconstructions of the cervical spine were also generated. COMPARISON:  None. FINDINGS: CT HEAD FINDINGS Brain: No evidence of acute infarction, hemorrhage, hydrocephalus, extra-axial collection or mass lesion/mass effect. Vascular:  No hyperdense vessel or unexpected calcification. Skull: Large left frontal scalp hematoma. No displaced calvarial fracture. Sinuses/Orbits: Tiny left maxillary sinus mucous retention cyst. Otherwise visualized paranasal sinuses and mastoid air cells are normally aerated. Other: None. CT CERVICAL SPINE FINDINGS Alignment: Normal. Skull base and vertebrae: No acute fracture. No primary bone lesion or focal pathologic process. Soft tissues and spinal canal: No prevertebral fluid or swelling. No visible canal hematoma. Disc levels:  Minimal cervical degenerative changes. Upper chest: Negative. 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 IMPRESSION: 1. Large left frontal scalp hematoma. No displaced calvarial fracture. 2. No acute  intracranial abnormality. 3. No acute fracture or dislocation of the cervical spine. 4. 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 Electronically Signed   By: Kristine Garbe M.D.   On: 03/10/2016 04:28   Dg Chest Port 1 View  Result Date: 03/10/2016 CLINICAL DATA:  Shortness of breath. Patient fell in the bathroom. 08/03/2015 EXAM: PORTABLE CHEST 1 VIEW COMPARISON:  Radiographs 08/03/2015 FINDINGS: Cardiomegaly is stable. There is vascular congestion, chronic. Diminished pulmonary edema from prior exam. Streaky bibasilar atelectasis. No evidence of pleural effusion, focal pneumonia or pneumothorax. No gross evidence of acute osseous abnormality, body habitus limits assessment. The patient is rotated to the left. IMPRESSION: Stable cardiomegaly, chronic vascular congestion. Diminished pulmonary edema from prior. No evidence of new abnormality allowing for limitations. Electronically Signed   By: Jeb Levering M.D.   On: 03/10/2016 03:51    Procedures Procedures (including critical care time)  Medications Ordered in ED Medications  naloxone (NARCAN) 4 mg in dextrose 5 % 250 mL infusion (1 mg/hr Intravenous New Bag/Given 03/10/16 0459)  naloxone [facility]) injection 2 mg (2 mg Intravenous Given 03/10/16 0328)  naloxone [facility]) injection 1 mg (1 mg Intravenous Given 03/10/16 0431)  0.9 %  sodium chloride infusion ( Intravenous New Bag/Given 03/10/16 0514)     Initial Impression / Assessment and Plan / ED Course  I have reviewed the triage vital signs and the nursing notes.  Pertinent labs & imaging results that were available during my care of the patient were reviewed by me and considered in my medical decision making (see chart for details).   Patient brought to the emergency department by abdomen from home. Patient was reportedly found face down by her husband. First responders reported that she was unresponsive,  hypoxic with diminished breathing. Breasts were assisted. EMS administered Narcan with arousal, but patient has become somnolent again during transport.  Patient has a history of fibromyalgia. She takes oxycodone chronically. EMS report that they found her on the floor surrounded by multiple pills. It's unclear which pills she took, but this presentation is consistent with overdose. She has become awake and alert with boluses of Narcan, but rapidly falls asleep again. She was therefore initiated on a Narcan drip.  Has a large hematoma on the left forehead and scalp. CT of head and cervical spine, however, showed no acute abnormality other than the hematoma. Bloodwork unremarkable. Urinalysis shows obvious infection, initiated on Rocephin.  CRITICAL CARE Performed by: Orpah Greek   Total critical care time: 30 minutes  Critical care time was exclusive of separately billable procedures and treating other patients.  Critical care was necessary to treat or prevent imminent or life-threatening deterioration.  Critical care was time spent personally by me on  the following activities: development of treatment plan with patient and/or surrogate as well as nursing, discussions with consultants, evaluation of patient's response to treatment, examination of patient, obtaining history from patient or surrogate, ordering and performing treatments and interventions, ordering and review of laboratory studies, ordering and review of radiographic studies, pulse oximetry and re-evaluation of patient's condition.   Final Clinical Impressions(s) / ED Diagnoses   Final diagnoses:  Syncope, unspecified syncope type  Opioid overdose, undetermined intent, initial encounter  Contusion of other part of head, initial encounter  Urinary tract infection without hematuria, site unspecified    New Prescriptions New Prescriptions   No medications on file  I personally performed the services described in  this documentation, which was scribed in my presence. The recorded information has been reviewed and is accurate.     Orpah Greek, MD 03/10/16 [phone]

## 2016-03-10 NOTE — ED Notes (Signed)
Pt removed from  Bipap and placed on 2 liters n/c , pt remains on the narcan gtt , pt will arouse to voice but still sleepy

## 2016-03-10 NOTE — ED Notes (Signed)
Pt states that she does not have belongings. No belongings at bedside. No charting in regards to jewelry and/or clothes. Charge RN made aware.

## 2016-03-11 ENCOUNTER — Encounter (HOSPITAL_COMMUNITY): Payer: Self-pay | Admitting: *Deleted

## 2016-03-11 DIAGNOSIS — J9600 Acute respiratory failure, unspecified whether with hypoxia or hypercapnia: Secondary | ICD-10-CM

## 2016-03-11 DIAGNOSIS — L8990 Pressure ulcer of unspecified site, unspecified stage: Secondary | ICD-10-CM | POA: Insufficient documentation

## 2016-03-11 LAB — BLOOD GAS, ARTERIAL
Acid-Base Excess: 9.5 mmol/L — ABNORMAL HIGH (ref 0.0–2.0)
Bicarbonate: 33.8 mmol/L — ABNORMAL HIGH (ref 20.0–28.0)
Drawn by: 270221
FIO2: 0.21
O2 SAT: 88.9 %
PATIENT TEMPERATURE: 98.2
PCO2 ART: 48.3 mmHg — AB (ref 32.0–48.0)
PO2 ART: 55.2 mmHg — AB (ref 83.0–108.0)
pH, Arterial: 7.458 — ABNORMAL HIGH (ref 7.350–7.450)

## 2016-03-11 LAB — CBC
HEMATOCRIT: 37.3 % (ref 36.0–46.0)
Hemoglobin: 11.8 g/dL — ABNORMAL LOW (ref 12.0–15.0)
MCH: 31.5 pg (ref 26.0–34.0)
MCHC: 31.6 g/dL (ref 30.0–36.0)
MCV: 99.5 fL (ref 78.0–100.0)
Platelets: 87 10*3/uL — ABNORMAL LOW (ref 150–400)
RBC: 3.75 MIL/uL — AB (ref 3.87–5.11)
RDW: 16.3 % — ABNORMAL HIGH (ref 11.5–15.5)
WBC: 4.1 10*3/uL (ref 4.0–10.5)

## 2016-03-11 LAB — URINE CULTURE: Culture: NO GROWTH

## 2016-03-11 LAB — BASIC METABOLIC PANEL
Anion gap: 6 (ref 5–15)
BUN: 9 mg/dL (ref 6–20)
CHLORIDE: 106 mmol/L (ref 101–111)
CO2: 32 mmol/L (ref 22–32)
Calcium: 6.5 mg/dL — ABNORMAL LOW (ref 8.9–10.3)
Creatinine, Ser: 0.97 mg/dL (ref 0.44–1.00)
GFR calc non Af Amer: >60 mL/min (ref >60)
Glucose, Bld: 75 mg/dL (ref 65–99)
Potassium: 4.2 mmol/L (ref 3.5–5.1)
SODIUM: 144 mmol/L (ref 135–145)

## 2016-03-11 LAB — MRSA PCR SCREENING: MRSA by PCR: NEGATIVE

## 2016-03-11 MED ORDER — LEVOTHYROXINE SODIUM 100 MCG PO TABS
300.0000 ug | ORAL_TABLET | Freq: Every day | ORAL | Status: DC
  Administered 2016-03-11 – 2016-03-12 (×2): 300 ug via ORAL
  Filled 2016-03-11 (×2): qty 3

## 2016-03-11 MED ORDER — OXYCODONE HCL 5 MG PO TABS
5.0000 mg | ORAL_TABLET | ORAL | Status: DC | PRN
  Administered 2016-03-11 – 2016-03-12 (×3): 5 mg via ORAL
  Filled 2016-03-11 (×3): qty 1

## 2016-03-11 MED ORDER — MAGNESIUM SULFATE 4 GM/100ML IV SOLN
4.0000 g | Freq: Once | INTRAVENOUS | Status: DC
  Filled 2016-03-11: qty 100

## 2016-03-11 MED ORDER — LOSARTAN POTASSIUM 50 MG PO TABS
100.0000 mg | ORAL_TABLET | Freq: Every day | ORAL | Status: DC
  Administered 2016-03-11 – 2016-03-12 (×2): 100 mg via ORAL
  Filled 2016-03-11 (×2): qty 2

## 2016-03-11 MED ORDER — DULOXETINE HCL 60 MG PO CPEP
60.0000 mg | ORAL_CAPSULE | Freq: Every day | ORAL | Status: DC
  Administered 2016-03-11 – 2016-03-12 (×2): 60 mg via ORAL
  Filled 2016-03-11 (×2): qty 1

## 2016-03-11 MED ORDER — CELECOXIB 200 MG PO CAPS
200.0000 mg | ORAL_CAPSULE | Freq: Every day | ORAL | Status: DC
  Administered 2016-03-11 – 2016-03-12 (×2): 200 mg via ORAL
  Filled 2016-03-11 (×3): qty 1

## 2016-03-11 MED ORDER — AMLODIPINE BESYLATE 5 MG PO TABS
5.0000 mg | ORAL_TABLET | Freq: Every day | ORAL | Status: DC
  Administered 2016-03-11 – 2016-03-12 (×2): 5 mg via ORAL
  Filled 2016-03-11 (×2): qty 1

## 2016-03-11 MED ORDER — PREGABALIN 75 MG PO CAPS
200.0000 mg | ORAL_CAPSULE | Freq: Three times a day (TID) | ORAL | Status: DC
  Administered 2016-03-11 – 2016-03-12 (×4): 200 mg via ORAL
  Filled 2016-03-11 (×4): qty 2

## 2016-03-11 MED ORDER — INFLUENZA VAC SPLIT QUAD 0.5 ML IM SUSY: 0.5000 mL | PREFILLED_SYRINGE | INTRAMUSCULAR | Status: DC

## 2016-03-11 NOTE — Progress Notes (Signed)
PULMONARY / CRITICAL CARE MEDICINE   Name: Jillian Eaton MRN: [id number] DOB: 1950/09/10    ADMISSION DATE:  03/10/2016 CONSULTATION DATE:  03/10/2016  REFERRING MD:  Dr. Betsey Holiday EDP  CHIEF COMPLAINT:  Fall/Overdose  HISTORY OF PRESENT ILLNESS:   66 year old female with PMH significant for hypertension, asthma, thyroid disease, and fibromyalgia. She has a history of falls per EMS who was very familiar with going to her house. 1/19 early AM her husband heard her fall and when he got to her she was laying face down on the floor surrounded by pills. EMS was called and administered Narcan with positive response. Upon arrival to ED she was somnolent again and was given additional narcan with good response. She was started on narcan infusion and PCCM has been asked to admit.   SUBJECTIVE:  Now in SDU.  Off bipap since yesterday.  Denies dyspnea.  Feels weak.    Denies intentional OD - she remembers taking 2 oxycodone before bedtime, then waking during the night unable to remember if she had taken them so she took 2 more as well as her lunesta.     VITAL SIGNS: BP (!) 127/95 (BP Location: Left Arm)   Pulse 70   Temp 98.9 F (37.2 C) (Axillary)   Resp 13   Ht 5\' 7"  (1.702 m)   Wt 110.7 kg (244 lb)   SpO2 99%   BMI 38.22 kg/m    INTAKE / OUTPUT: I/O last 3 completed shifts: In: 2333.9 [P.O.:360; I.V.:813.9; IV Piggyback:1160] Out: 525 [Urine:525]  PHYSICAL EXAMINATION: General:  Morbidly obese female in NAD Neuro:  Awake, alert, appropriate, generalized weakness.   HEENT:  Mm moist, no JVD  Cardiovascular:  s1s2 distant  Lungs:  resps even non labored on 2L [place], diminished bases  Abdomen:  Soft, non-tender, non-distended Musculoskeletal:  No acute deformity Skin:  Dry, grossly intact  LABS:  BMET  Recent Labs Lab 03/10/16 0350 03/11/16 0614  NA 139 144  K 4.8 4.2  CL 103 106  CO2 26 32  BUN 19 9  CREATININE 1.62* 0.97  GLUCOSE 108* 75     Electrolytes  Recent Labs Lab 03/10/16 0350 03/10/16 0706 03/11/16 0614  CALCIUM 6.5*  --  6.5*  MG  --  1.4*  --   PHOS  --  6.2*  --     CBC  Recent Labs Lab 03/10/16 0350 03/11/16 0614  WBC 4.0 4.1  HGB 13.9 11.8*  HCT 43.5 37.3  PLT 96* 87*    Coag's No results for input(s): APTT, INR in the last 168 hours.  Sepsis Markers  Recent Labs Lab 03/10/16 0558  LATICACIDVEN 0.77    ABG  Recent Labs Lab 03/10/16 0736 03/10/16 0958  PHART 7.174* 7.209*  PCO2ART 83.3* 76.8*  PO2ART 74.0* 64.0*    Liver Enzymes  Recent Labs Lab 03/10/16 0350  AST 34  ALT 11*  ALKPHOS 92  BILITOT 0.4  ALBUMIN 3.8    Cardiac Enzymes No results for input(s): TROPONINI, PROBNP in the last 168 hours.  Glucose No results for input(s): GLUCAP in the last 168 hours.  Imaging No results found.   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 Suspect unintentional overdose -  urine positive for opiates with response to narcan, however, several pills scattered around her. Likely polypharmacy.  Now awake, appropriate off narcan.   P:   Discussed polypharmacy and judicious use of narcotics  Supportive care  Mobilize  Resume celebrex, lyrica  Low dose PRN oxycodone   Restrictive lung disease (obesity related) mild on PFT Pulmonary HTN   Pulmonary function testing in August 2017 showed a normal ratio, a 20% change in FEV1 with bronchodilator 1.14-1.38 L, 60% predicted, FVC 1.68 L, total lung capacity 3.34 L, 60% predicted, DLCO 21.15 (74% predicted)  P:   PRN nebs per home regimen  Supplemental O2 - check RA sat prior to d/c (she has O2 at home but doesn't use it)  outpt pulmonary f/u     Hypertension Chronic diastolic CHF P:  Resume home norvasc, losartan      Thrombocytopenia P:  Follow  PCP f/u     ENDOCRINE A:   Hx thyroid disorder - TSH >68! States she has not taken synthroid in at least a month.   P:   Resume synthroid 339mcg daily  Discussed importance of medication compliance  PCP f/u     FAMILY  - Updates: discussed at length with pt 1/20.    Can likely d/c home this afternoon or in am if pt does well getting OOB, assess O2 needs, resume home meds.     Addendum - now pt saying that she feels very weak, has difficulty getting around at home. Does not feel comfortable going home this weekend.  Wants PT eval and PT at home.  Will tx to floor and ask [facility] to assume care 1/21.    Nickolas Madrid, NP 03/11/2016  2:26 PM Pager: [phone] or [phone]

## 2016-03-11 NOTE — Plan of Care (Signed)
Problem: Pain Managment: Goal: General experience of comfort will improve Outcome: Progressing Discussed about narcan and pain medication with some teach back displayed  Comments: Patient received order for as needed pain medication (See MAR)

## 2016-03-12 ENCOUNTER — Telehealth: Payer: Self-pay | Admitting: Internal Medicine

## 2016-03-12 DIAGNOSIS — J9621 Acute and chronic respiratory failure with hypoxia: Secondary | ICD-10-CM

## 2016-03-12 DIAGNOSIS — J9622 Acute and chronic respiratory failure with hypercapnia: Secondary | ICD-10-CM

## 2016-03-12 LAB — CBC WITH DIFFERENTIAL/PLATELET
BASOS ABS: 0 10*3/uL (ref 0.0–0.1)
Basophils Relative: 0 %
Eosinophils Absolute: 0.2 10*3/uL (ref 0.0–0.7)
Eosinophils Relative: 4 %
HCT: 34.4 % — ABNORMAL LOW (ref 36.0–46.0)
HEMOGLOBIN: 11 g/dL — AB (ref 12.0–15.0)
LYMPHS PCT: 33 %
Lymphs Abs: 1.5 10*3/uL (ref 0.7–4.0)
MCH: 31.1 pg (ref 26.0–34.0)
MCHC: 32 g/dL (ref 30.0–36.0)
MCV: 97.2 fL (ref 78.0–100.0)
Monocytes Absolute: 0.7 10*3/uL (ref 0.1–1.0)
Monocytes Relative: 15 %
NEUTROS ABS: 2.3 10*3/uL (ref 1.7–7.7)
NEUTROS PCT: 49 %
Platelets: 95 10*3/uL — ABNORMAL LOW (ref 150–400)
RBC: 3.54 MIL/uL — AB (ref 3.87–5.11)
RDW: 15.8 % — ABNORMAL HIGH (ref 11.5–15.5)
WBC: 4.7 10*3/uL (ref 4.0–10.5)

## 2016-03-12 LAB — BASIC METABOLIC PANEL
ANION GAP: 5 (ref 5–15)
BUN: 8 mg/dL (ref 6–20)
CHLORIDE: 104 mmol/L (ref 101–111)
CO2: 35 mmol/L — ABNORMAL HIGH (ref 22–32)
Calcium: 6.7 mg/dL — ABNORMAL LOW (ref 8.9–10.3)
Creatinine, Ser: 0.96 mg/dL (ref 0.44–1.00)
GFR calc Af Amer: >60 mL/min (ref >60)
GLUCOSE: 81 mg/dL (ref 65–99)
POTASSIUM: 4 mmol/L (ref 3.5–5.1)
Sodium: 144 mmol/L (ref 135–145)

## 2016-03-12 LAB — PHOSPHORUS: PHOSPHORUS: 3.7 mg/dL (ref 2.5–4.6)

## 2016-03-12 LAB — MAGNESIUM: Magnesium: 2.2 mg/dL (ref 1.7–2.4)

## 2016-03-12 MED ORDER — FUROSEMIDE 20 MG PO TABS
20.0000 mg | ORAL_TABLET | Freq: Every day | ORAL | Status: DC
  Filled 2016-03-12: qty 1

## 2016-03-12 MED ORDER — PANTOPRAZOLE SODIUM 40 MG PO TBEC: 40.0000 mg | DELAYED_RELEASE_TABLET | Freq: Every day | ORAL | 0 refills | Status: DC

## 2016-03-12 MED ORDER — LEVOTHYROXINE SODIUM 150 MCG PO TABS: 300.0000 ug | ORAL_TABLET | Freq: Every day | ORAL | 0 refills | Status: DC

## 2016-03-12 MED ORDER — PANTOPRAZOLE SODIUM 40 MG PO TBEC: 40.0000 mg | DELAYED_RELEASE_TABLET | Freq: Every day | ORAL | Status: DC

## 2016-03-12 MED ORDER — ESZOPICLONE 3 MG PO TABS: 3.0000 mg | ORAL_TABLET | Freq: Every evening | ORAL | 0 refills | Status: DC | PRN

## 2016-03-12 MED ORDER — MIRABEGRON ER 25 MG PO TB24
50.0000 mg | ORAL_TABLET | Freq: Every day | ORAL | Status: DC
  Administered 2016-03-12: 50 mg via ORAL
  Filled 2016-03-12: qty 2

## 2016-03-12 MED ORDER — OXYCODONE HCL 5 MG PO TABS: 5.0000 mg | ORAL_TABLET | Freq: Four times a day (QID) | ORAL | 0 refills | Status: DC | PRN

## 2016-03-12 MED ORDER — FUROSEMIDE 20 MG PO TABS: 20.0000 mg | ORAL_TABLET | Freq: Every day | ORAL | 0 refills | Status: DC

## 2016-03-12 MED ORDER — CYCLOBENZAPRINE HCL 10 MG PO TABS: 10.0000 mg | ORAL_TABLET | Freq: Three times a day (TID) | ORAL | Status: DC | PRN

## 2016-03-12 MED ORDER — CYCLOBENZAPRINE HCL 10 MG PO TABS: 10.0000 mg | ORAL_TABLET | Freq: Three times a day (TID) | ORAL | 0 refills | Status: DC | PRN

## 2016-03-12 MED ORDER — PREGABALIN 75 MG PO CAPS: 200.0000 mg | ORAL_CAPSULE | Freq: Two times a day (BID) | ORAL | Status: DC

## 2016-03-12 NOTE — Progress Notes (Signed)
SATURATION QUALIFICATIONS: (This note is used to comply with regulatory documentation for home oxygen)  Patient Saturations on Room Air at Rest = 90%  Patient Saturations on Room Air while Ambulating = 82%  Patient Saturations on 2 Liters of oxygen while Ambulating = 92%  Please briefly explain why patient needs home oxygen: 

## 2016-03-12 NOTE — Progress Notes (Signed)
PHARMACIST - PHYSICIAN COMMUNICATION  DR:  Brand Males  CONCERNING: IV to Oral Route Change Policy  RECOMMENDATION: This patient is receiving Protonix by the intravenous route.  Based on criteria approved by the Pharmacy and Therapeutics Committee, the intravenous medication(s) is/are being converted to the equivalent oral dose form(s).   DESCRIPTION: These criteria include:  The patient is eating (either orally or via tube) and/or has been taking other orally administered medications for a least 24 hours  The patient has no evidence of active gastrointestinal bleeding or impaired GI absorption (gastrectomy, short bowel, patient on TNA or NPO).  If you have questions about this conversion, please contact the Pharmacy Department  [x]   [phone] )  Jillian Eaton  Stark Klein, PharmD Clinical Pharmacy Resident [phone] (Pager) 03/12/2016 3:05 PM

## 2016-03-12 NOTE — Care Management Note (Signed)
Case Management Note  Patient Details  Name: CRYSTALLE LEISS MRN: [id number] Date of Birth: 07/21/1950  Subjective/Objective:   CM received another call that pt could be discharged today if [place] and 3n1 could be delivered to room and [facility] could be arranged. CM spoke to [facility] who will arrange all as per previous conversation.                  Action/Plan: All HH needs  Have been arranged just need HH orders.    Expected Discharge Date:  03/12/16               Expected Discharge Plan:  [place]  In-House Referral:  NA  Discharge planning Services  CM Consult  Post Acute Care Choice:  Durable Medical Equipment Choice offered to:  Patient  DME Arranged:  Oxygen, 3-N-1, Walker rolling DME Agency:  [place]:  Nurse's Aide, PT [facility] Agency:  NA  Status of Service:  Completed, signed off  If discussed at [place] of Stay Meetings, dates discussed:    Additional Comments:  Delrae Sawyers, RN 03/12/2016, 4:14 PM

## 2016-03-12 NOTE — Telephone Encounter (Signed)
Triage  This Jillian Eaton has a different MRN too - she has seen mcquaid - please ensure fu with him or APP. Needs OSA eval too   Recent Labs Lab 03/10/16 0736 03/10/16 0958 03/11/16 1720  PHART 7.174* 7.209* 7.458*  PCO2ART 83.3* 76.8* 48.3*  PO2ART 74.0* 64.0* 55.2*  HCO3 30.7* 30.6* 33.8*  TCO2 33 33  --   O2SAT 89.0 86.0 88.9      Dr. Brand Males, M.D., [facility].C.P Pulmonary and Critical Care Medicine Staff Physician [place] [facility] Pager: [phone], If no answer or between  15:00h - 7:00h: call [phone]  03/12/2016 12:06 PM

## 2016-03-12 NOTE — Discharge Summary (Signed)
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 Unintentional drug overdose. Morbid obesity. Essential hypertension. Chronic diastolic CHF. Chronic pain syndrome.  Admitted From: home Disposition:  Home with home health  Recommendations for Outpatient Follow-up:  1. Follow-up with PCP as well as pain management clinic in one week to decide regarding further adjustment of her pain medication. 2. Follow-up with pulmonary for further workup for chronic respiratory failure.   Follow-up [facility] Follow up.   Why:  Oxygen has been ordered from [facility] to be delivered to your room. Please call them with issues or concerns after initial delivery.  Contact information: [address] [place] [address] [phone]        Red Christians, MD. Schedule an appointment as soon as possible for a visit in 1 week(s).   Specialty:  Oncology Why:  make adjustment in pain regimen.  Contact information: [address] [place] [address] [phone]        Kerin Perna., MD. Schedule an appointment as soon as possible for a visit in 1 week(s).   Specialty:  Neurology Why:  to adjust pain medication Contact information: [id number] [place] [address] [phone]        Simonne Maffucci, MD. Schedule an appointment as soon as possible for a visit in 1 month(s).   Specialty:  Pulmonary Disease Contact information: [place] [address] [phone]          Diet recommendation: Cardiac diet  Activity: The patient is advised to gradually reintroduce usual activities.  Discharge Condition: good  Code Status: Full code  History of present illness: As per the H  and P dictated on admission, "66 year old female with PMH significant for hypertension, asthma, thyroid disease, and fibromyalgia. She has a history of falls per EMS who was very familiar with going to her house. 1/19 early AM her husband heard her fall and when he got to her she was laying face down on the floor surrounded by pills. EMS was called and administered Narcan with positive response. Upon arrival to ED she was somnolent again and was given additional narcan with good response. She was started on narcan infusion and PCCM has been asked to admit"  Hospital Course:  Patient presented with altered mental status after receiving multiple pain medication inadvertently. Presented unresponsive, started on [place] infusion as well as BiPAP. Showed improvement in her mentation on Narcan drip and it was discontinued. Critical care recommended to resume oxycodone but at a lower dose. Patient was transfer to hospitalist service.  Summary of her active problems in the hospital is as following. 1. Acute toxic encephalopathy - resolved. 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 unintentional drug overdose urine positive for opiates with response to narcan, however, several pills scattered around her. Likely polypharmacy. Now awake, appropriate off narcan drip. Discussed polypharmacy and judicious use of narcotics, pt will get pill box for herself. Resume celebrex, lyrica  Low dose PRN oxycodone, new prescription provided, recommended not to take old medications.  2. Restrictive lung disease (obesity related) mild on PFT Pulmonary HTN  Pulmonary function testing in August 2017 showed a normal ratio, a 20% change in  FEV1 with bronchodilator 1.14-1.38 L, 60% predicted, FVC 1.68 L, total lung capacity 3.34 L, 60% predicted, DLCO 21.15 (74% predicted) PRN nebs per home regimen  Supplemental O2 she has O2 at home but doesn't use it outpt pulmonary f/u   3.  Hypertension Chronic diastolic CHF Resume home norvasc, losartan  Restart lasix  4. Thrombocytopenia Stable   5. Hx thyroid disorder - TSH >68! States she has not taken synthroid in at least a month.  Resume synthroid 366mcg daily  Discussed importance of medication compliance  PCP f/u    6.chronic knee arthritis. Chronic pain syndrome. [place] was reviewed. Last prescription filled for controlled substance was on 03/07/2016 for oxycodone 10 mg, 5 times a day for 30 days. Due to current presentation with acute encephalopathy dose has been reduced to 5 mg, every 6 hours when necessary. 5 day supply was provided, patient will follow-up with pain management Weber Cooks MD for further refills.  Patient was instructed not to drive, operating heavy machinery, perform activities at heights, swimming or participation in water activities or provide baby sitting services while on Pain, Sleep and Anxiety Medications; until her outpatient Physician has advised to do so again. Also recommended to not to take more than prescribed Pain, Sleep and Anxiety Medications.   All other chronic medical condition were stable during the hospitalization.  Patient was seen by physical therapy, who recommended home health, which was arranged by Education officer, museum and case Freight forwarder. On the day of the discharge the patient's oxygenation and vitals were stable, and no other acute medical condition were reported by patient. the patient was felt safe to be discharge at home with home health.  Procedures and Results:  BiPAP   Consultations:  Critical care primary admission  DISCHARGE MEDICATION: Current Discharge Medication List    START taking these medications   Details  furosemide (LASIX) 20 MG tablet Take 1 tablet (20 mg total) by mouth daily. Qty: 30 tablet, Refills: 0    pantoprazole (PROTONIX) 40 MG tablet Take 1 tablet (40 mg total) by mouth at bedtime. Qty: 30 tablet, Refills: 0        CONTINUE these medications which have CHANGED   Details  cyclobenzaprine (FLEXERIL) 10 MG tablet Take 1 tablet (10 mg total) by mouth 3 (three) times daily as needed for muscle spasms. Qty: 30 tablet, Refills: 0    Eszopiclone (ESZOPICLONE) 3 MG TABS Take 1 tablet (3 mg total) by mouth at bedtime as needed. Take immediately before bedtime Refills: 0    levothyroxine (SYNTHROID, LEVOTHROID) 150 MCG tablet Take 2 tablets (300 mcg total) by mouth daily before breakfast. Qty: 60 tablet, Refills: 0    oxyCODONE (OXY IR/ROXICODONE) 5 MG immediate release tablet Take 1 tablet (5 mg total) by mouth every 6 (six) hours as needed for moderate pain. Qty: 20 tablet, Refills: 0      CONTINUE these medications which have NOT CHANGED   Details  acetaminophen (TYLENOL) 500 MG tablet Take 500 mg by mouth every 6 (six) hours as needed for mild pain.    amLODipine (NORVASC) 5 MG tablet Take 5 mg by mouth daily.    calcium carbonate (CALCIUM 600) 600 MG TABS tablet Take 600 mg by mouth 2 (two) times daily with a meal.    celecoxib (CELEBREX) 200 MG capsule Take 200 mg by mouth daily.     DULoxetine (CYMBALTA) 60 MG capsule Take 60 mg by mouth daily.    losartan (COZAAR) 100 MG tablet Take 100  mg by mouth daily.    mirabegron ER (MYRBETRIQ) 50 MG TB24 tablet Take 50 mg by mouth daily.    pregabalin (LYRICA) 200 MG capsule Take 200 mg by mouth 3 (three) times daily.    Vitamin D, Ergocalciferol, (DRISDOL) 50000 units CAPS capsule Take 50,000 Units by mouth every 7 (seven) days.       Allergies  Allergen Reactions  . Penicillins Other (See Comments)    intolerance   Discharge Instructions    Diet - low sodium heart healthy    Complete by:  As directed    Discharge instructions    Complete by:  As directed    It is important that you read following instructions as well as go over your medication list with RN to help you understand your care after this hospitalization.  Discharge  Instructions: Please follow-up with PCP in one week  Please request your primary care physician to go over all Hospital Tests and Procedure/Radiological results at the follow up,  Please get all Hospital records sent to your PCP by signing hospital release before you go home.   Do not drive, operating heavy machinery, perform activities at heights, swimming or participation in water activities or provide baby sitting services; until you have been seen by Primary Care Physician or a Neurologist and advised to do so again. Do not take more than prescribed Pain, Sleep and Anxiety Medications. You were cared for by a hospitalist during your hospital stay. If you have any questions about your discharge medications or the care you received while you were in the hospital after you are discharged, you can call the unit and ask to speak with the hospitalist on call if the hospitalist that took care of you is not available.  Once you are discharged, your primary care physician will handle any further medical issues. Please note that NO REFILLS for any discharge medications will be authorized once you are discharged, as it is imperative that you return to your primary care physician (or establish a relationship with a primary care physician if you do not have one) for your aftercare needs so that they can reassess your need for medications and monitor your lab values. You Must read complete instructions/literature along with all the possible adverse reactions/side effects for all the Medicines you take and that have been prescribed to you. Take any new Medicines after you have completely understood and accept all the possible adverse reactions/side effects. Wear Seat belts while driving. If you have smoked or chewed Tobacco in the last 2 yrs please stop smoking and/or stop any Recreational drug use.   Increase activity slowly    Complete by:  As directed      Discharge Exam: Filed Weights   03/10/16 0323  03/10/16 1815 03/11/16 0412  Weight: (!) 156 kg (344 lb) (!) 156 kg (344 lb) 110.7 kg (244 lb)   Vitals:   03/12/16 0428 03/12/16 1445  BP: 118/74 120/78  Pulse: 68 76  Resp: 12 (!) 23  Temp: 97.7 F (36.5 C) 98.2 F (36.8 C)   General: Appear in no distress, no Rash; Oral Mucosa moist. Cardiovascular: S1 and S2 Present, no Murmur, no JVD Respiratory: Bilateral Air entry present and Clear to Auscultation, no Crackles, no wheezes Abdomen: Bowel Sound present, Soft and no tenderness Extremities: bilateral Pedal edema, no calf tenderness Neurology: Grossly no focal neuro deficit.  The results of significant diagnostics from this hospitalization (including imaging, microbiology, ancillary and laboratory) are listed below for  reference.    Significant Diagnostic Studies: Ct Head Wo Contrast  Result Date: 03/10/2016 CLINICAL DATA:  66 y/o  F; syncope and fall. EXAM: CT HEAD WITHOUT CONTRAST CT CERVICAL SPINE WITHOUT CONTRAST TECHNIQUE: Multidetector CT imaging of the head and cervical spine was performed following the standard protocol without intravenous contrast. Multiplanar CT image reconstructions of the cervical spine were also generated. COMPARISON:  None. FINDINGS: CT HEAD FINDINGS Brain: No evidence of acute infarction, hemorrhage, hydrocephalus, extra-axial collection or mass lesion/mass effect. Vascular: No hyperdense vessel or unexpected calcification. Skull: Large left frontal scalp hematoma. No displaced calvarial fracture. Sinuses/Orbits: Tiny left maxillary sinus mucous retention cyst. Otherwise visualized paranasal sinuses and mastoid air cells are normally aerated. Other: None. CT CERVICAL SPINE FINDINGS Alignment: Normal. Skull base and vertebrae: No acute fracture. No primary bone lesion or focal pathologic process. Soft tissues and spinal canal: No prevertebral fluid or swelling. No visible canal hematoma. Disc levels:  Minimal cervical degenerative changes. Upper chest:  Negative. 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 IMPRESSION: 1. Large left frontal scalp hematoma. No displaced calvarial fracture. 2. No acute intracranial abnormality. 3. No acute fracture or dislocation of the cervical spine. 4. 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 Electronically Signed   By: Kristine Garbe M.D.   On: 03/10/2016 04:28   Ct Cervical Spine Wo Contrast  Result Date: 03/10/2016 CLINICAL DATA:  66 y/o  F; syncope and fall. EXAM: CT HEAD WITHOUT CONTRAST CT CERVICAL SPINE WITHOUT CONTRAST TECHNIQUE: Multidetector CT imaging of the head and cervical spine was performed following the standard protocol without intravenous contrast. Multiplanar CT image reconstructions of the cervical spine were also generated. COMPARISON:  None. FINDINGS: CT HEAD FINDINGS Brain: No evidence of acute infarction, hemorrhage, hydrocephalus, extra-axial collection or mass lesion/mass effect. Vascular: No hyperdense vessel or unexpected calcification. Skull: Large left frontal scalp hematoma. No displaced calvarial fracture. Sinuses/Orbits: Tiny left maxillary sinus mucous retention cyst. Otherwise visualized paranasal sinuses and mastoid air cells are normally aerated. Other: None. CT CERVICAL SPINE FINDINGS Alignment: Normal. Skull base and vertebrae: No acute fracture. No primary bone lesion or focal pathologic process. Soft tissues and spinal canal: No prevertebral fluid or swelling. No visible canal hematoma. Disc levels:  Minimal cervical degenerative changes. Upper chest: Negative. 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 IMPRESSION: 1. Large left frontal scalp  hematoma. No displaced calvarial fracture. 2. No acute intracranial abnormality. 3. No acute fracture or dislocation of the cervical spine. 4. 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 Electronically Signed   By: Kristine Garbe M.D.   On: 03/10/2016 04:28   Dg Chest Port 1 View  Result Date: 03/10/2016 CLINICAL DATA:  Shortness of breath. Patient fell in the bathroom. 08/03/2015 EXAM: PORTABLE CHEST 1 VIEW COMPARISON:  Radiographs 08/03/2015 FINDINGS: Cardiomegaly is stable. There is vascular congestion, chronic. Diminished pulmonary edema from prior exam. Streaky bibasilar atelectasis. No evidence of pleural effusion, focal pneumonia or pneumothorax. No gross evidence of acute osseous abnormality, body habitus limits assessment. The patient is rotated to the left. IMPRESSION: Stable cardiomegaly, chronic vascular congestion. Diminished pulmonary edema from prior. No evidence of new abnormality allowing for limitations.  Electronically Signed   By: Jeb Levering M.D.   On: 03/10/2016 03:51    Microbiology: Recent Results (from the past 240 hour(s))  Urine culture     Status: None   Collection Time: 03/10/16  4:35 AM  Result Value Ref Range Status   Specimen Description URINE, RANDOM  Final   Special Requests NONE  Final   Culture NO GROWTH  Final   Report Status 03/11/2016 FINAL  Final  Culture, blood (Routine X 2) w Reflex to ID Panel     Status: None (Preliminary result)   Collection Time: 03/10/16  6:10 AM  Result Value Ref Range Status   Specimen Description BLOOD RIGHT THUMB  Final   Special Requests BOTTLES DRAWN AEROBIC AND ANAEROBIC 3CC  Final   Culture NO GROWTH 2 DAYS  Final   Report Status PENDING  Incomplete  Culture, blood (Routine X 2) w Reflex to ID Panel     Status: None (Preliminary result)   Collection Time: 03/10/16  6:40 AM  Result Value Ref Range Status   Specimen Description BLOOD  RIGHT HAND  Final   Special Requests IN PEDIATRIC BOTTLE 2CC  Final   Culture NO GROWTH 2 DAYS  Final   Report Status PENDING  Incomplete  MRSA PCR Screening     Status: None   Collection Time: 03/10/16  7:55 PM  Result Value Ref Range Status   MRSA by PCR NEGATIVE NEGATIVE Final    Comment:        The GeneXpert MRSA Assay (FDA approved for NASAL specimens only), is one component of a comprehensive MRSA colonization surveillance program. It is not intended to diagnose MRSA infection nor to guide or monitor treatment for MRSA infections.      Labs: CBC:  Recent Labs Lab 03/10/16 0350 03/11/16 0614 03/12/16 0416  WBC 4.0 4.1 4.7  NEUTROABS 2.4  --  2.3  HGB 13.9 11.8* 11.0*  HCT 43.5 37.3 34.4*  MCV 100.2* 99.5 97.2  PLT 96* 87* 95*   Basic Metabolic Panel:  Recent Labs Lab 03/10/16 0350 03/10/16 0706 03/11/16 0614 03/12/16 0416  NA 139  --  144 144  K 4.8  --  4.2 4.0  CL 103  --  106 104  CO2 26  --  32 35*  GLUCOSE 108*  --  75 81  BUN 19  --  9 8  CREATININE 1.62*  --  0.97 0.96  CALCIUM 6.5*  --  6.5* 6.7*  MG  --  1.4*  --  2.2  PHOS  --  6.2*  --  3.7   Liver Function Tests:  Recent Labs Lab 03/10/16 0350  AST 34  ALT 11*  ALKPHOS 92  BILITOT 0.4  PROT 7.1  ALBUMIN 3.8   Cardiac Enzymes:  Recent Labs Lab 03/10/16 0524  CKTOTAL 623*   BNP (last 3 results)  Recent Labs  03/10/16 0346  BNP 47.8   CBG: No results for input(s): GLUCAP in the last 168 hours. Time spent: 30 minutes  Signed:  Berle Mull  [facility]  03/12/2016  , 4:25 PM

## 2016-03-12 NOTE — Care Management Note (Signed)
Case Management Note  Patient Details  Name: Jillian Eaton MRN: [id number] Date of Birth: 19-Apr-1950  Subjective/Objective: Contacted Jermaine with [facility] to provide DME Oxygen.                    Action/Plan: Anticipate discharge home today. No further CM needs but will be available should additional discharge needs arise.   Expected Discharge Date:  03/14/16               Expected Discharge Plan:  [place]  In-House Referral:  NA  Discharge planning Services  CM Consult  Post Acute Care Choice:  Durable Medical Equipment Choice offered to:  Patient  DME Arranged:  Oxygen DME Agency:  [place]:  NA HH Agency:  NA  Status of Service:  Completed, signed off  If discussed at [place] of Stay Meetings, dates discussed:    Additional Comments:  Delrae Sawyers, RN 03/12/2016, 11:18 AM

## 2016-03-12 NOTE — Progress Notes (Signed)
Discharge instructions given to patient and all questions answered.  

## 2016-03-12 NOTE — Evaluation (Signed)
Physical Therapy Evaluation Patient Details Name: [name] MRN: [id number] DOB: 08/27/1950 Today's Date: 03/12/2016   History of Present Illness  Patient is a 66 yo female admitted on 03/10/2016, with complaint of opioid overdose, fall, was found to have acute resp failure, encephalopathy.     PMH:  morbid obesity, HTN, asthma, fibromyalgia, falls, CHF   Clinical Impression  Patient presents with problems listed below.  Was able to ambulate 30' with RW and min guard assist.  Difficulty with sit <> stand and bed mobility.  Recommend f/u HHPT, and HH Aide to assist with ADL's.  Recommend bariatric RW and a tub bench for use at home.  Ready for d/c from PT perspective.    Follow Up Recommendations Home health PT;Supervision for mobility/OOB ([place] for assist with ADL's)    Equipment Recommendations  Rolling walker with 5" wheels (Bariatric RW; Tub bench)    Recommendations for Other Services       Precautions / Restrictions Precautions Precautions: Fall Precaution Comments: h/o falls pta Restrictions Weight Bearing Restrictions: No      Mobility  Bed Mobility Overal bed mobility: Needs Assistance Bed Mobility: Sidelying to Sit;Sit to Supine   Sidelying to sit: Min guard   Sit to supine: Min assist   General bed mobility comments: Increased time to move to sitting.  Required assist to bring LE's onto bed to return to sidelying.  Transfers Overall transfer level: Needs assistance Equipment used: Rolling walker (2 wheeled) Transfers: Sit to/from [facility] Sit to Stand: Min assist Stand pivot transfers: Min assist       General transfer comment: Patient uses correct hand placement.  Assist to steady during transfer to standing from bed and BSC.  Min assist to transfer bed <> BSC, to steady.  Ambulation/Gait Ambulation/Gait assistance: Min guard Ambulation Distance (Feet): 30 Feet Assistive device: Rolling walker (2 wheeled) Gait  Pattern/deviations: Step-through pattern;Decreased step length - right;Decreased step length - left;Decreased stride length;Shuffle;Trunk flexed Gait velocity: decreased Gait velocity interpretation: Below normal speed for age/gender General Gait Details: Patient demonstrates safe use of RW.  Patient with slow, shuffling gait, with flexed posture.  Cues to try to stand upright during gait.  No loss of balance during gait.  Stairs            Wheelchair Mobility    Modified Rankin (Stroke Patients Only)       Balance Overall balance assessment: Needs assistance Sitting-balance support: No upper extremity supported;Feet supported Sitting balance-Leahy Scale: Good     Standing balance support: Single extremity supported;During functional activity Standing balance-Leahy Scale: Poor Standing balance comment: Able to stand and perform pericare after voiding. Requires at least single UE support in stance.                             Pertinent Vitals/Pain Pain Assessment: 0-10 Pain Score: 4  Pain Location: bilateral knees, Rt > Lt;  back Pain Descriptors / Indicators: Aching;Sore Pain Intervention(s): Limited activity within patient's tolerance;Monitored during session;Repositioned    Home Living Family/patient expects to be discharged to:: Private residence Living Arrangements: Spouse/significant other Available Help at Discharge: Family;Available 24 hours/day Type of Home: House Home Access: Stairs to enter Entrance Stairs-Rails: None Entrance Stairs-Number of Steps: 1 Home Layout: Two level;Bed/bath upstairs Home Equipment: Cane - single point;Walker - 4 wheels Additional Comments: Difficulty getting upstairs to bedroom.    Prior Function Level of Independence: Independent with assistive device(s);Needs  assistance   Gait / Transfers Assistance Needed: Uses rollator on 1st floor of home.  Difficulty moving sit <> stand due to bilateral knee issues.  ADL's /  Homemaking Assistance Needed: Assist with bathing.  Fatigues quickly with ADL's.  Difficulty standing long enough to cook.        Hand Dominance        Extremity/Trunk Assessment   Upper Extremity Assessment Upper Extremity Assessment: Overall WFL for tasks assessed    Lower Extremity Assessment Lower Extremity Assessment: Generalized weakness (Bilateral knee issues impacting function)       Communication   Communication: No difficulties  Cognition Arousal/Alertness: Awake/alert Behavior During Therapy: WFL for tasks assessed/performed Overall Cognitive Status: Within Functional Limits for tasks assessed                      General Comments      Exercises     Assessment/Plan    PT Assessment All further PT needs can be met in the next venue of care  PT Problem List Decreased strength;Decreased activity tolerance;Decreased balance;Decreased mobility;Decreased knowledge of use of DME;Cardiopulmonary status limiting activity;Obesity;Pain          PT Treatment Interventions      PT Goals (Current goals can be found in the Care Plan section)  Acute Rehab PT Goals PT Goal Formulation: All assessment and education complete, DC therapy (Patient to receive f/u with HHPT at d/c)    Frequency     Barriers to discharge        Co-evaluation               End of Session Equipment Utilized During Treatment: Gait belt;Oxygen Activity Tolerance: Patient limited by fatigue;Patient limited by pain Patient left: in bed;with call bell/phone within reach Nurse Communication: Mobility status (f/u and equipment needs for d/c)         Time: 2003-7944 PT Time Calculation (min) (ACUTE ONLY): 45 min   Charges:   PT Evaluation $PT Eval Moderate Complexity: 1 Procedure PT Treatments $Gait Training: 8-22 mins $Therapeutic Activity: 8-22 mins   PT G Codes:        Despina Pole 03-26-2016, 4:13 PM Carita Pian. Sanjuana Kava, [place] Pager  [phone]

## 2016-03-12 NOTE — Progress Notes (Signed)
[facility] Progress Note  Patient: Jillian Eaton [id number]   PCP: No primary care provider on file. DOB: 1950/10/22   DOA: 03/10/2016   DOS: 03/12/2016   Date of Service: the patient was seen and examined on 03/12/2016  Brief hospital course: Pt. with PMH of morbid obesity, chronic resp failure, chronic arthritis causing pain; admitted on 03/10/2016, with complaint of opioid overdose, was found to have acute resp failure . Currently further plan is monitor PT Recommendation.  Assessment and Plan: 1. Acute toxic encephalopathy - resolved. unintentional drug overdose urine positive for opiates with response to narcan, however, several pills scattered around her. Likely polypharmacy. Now awake, appropriate off narcan drip. Discussed polypharmacy and judicious use of narcotics, pt will get pill box for herself. Resume celebrex, lyrica  Low dose PRN oxycodone  2. Restrictive lung disease (obesity related) mild on PFT Pulmonary HTN  Pulmonary function testing in August 2017 showed a normal ratio, a 20% change in FEV1 with bronchodilator 1.14-1.38 L, 60% predicted, FVC 1.68 L, total lung capacity 3.34 L, 60% predicted, DLCO 21.15 (74% predicted) PRN nebs per home regimen  Supplemental O2 she has O2 at home but doesn't use it outpt pulmonary f/u   Hypertension Chronic diastolic CHF Resume home norvasc, losartan  Resume lasix    Thrombocytopenia Stable   Hx thyroid disorder - TSH >68! States she has not taken synthroid in at least a month.   Resume synthroid 356mcg daily  Discussed importance of medication compliance  PCP f/u   Bowel regimen: last BM 03/11/2016 Diet: cardiac diet DVT Prophylaxis: subcutaneous Heparin  Advance goals of care discussion: full code  Family Communication: no family was present at bedside, at the time of interview.  Disposition:  Discharge to home wth home health. Expected discharge date: 01/21-22/2018, pending PT  eval  Consultants: CCM primary admission Procedures: bipap  Antibiotics: Anti-infectives    Start     Dose/Rate Route Frequency Ordered Stop   03/10/16 0530  cefTRIAXone (ROCEPHIN) 1 g in dextrose 5 % 50 mL IVPB     1 g 100 mL/hr over 30 Minutes Intravenous  Once 03/10/16 0522 03/10/16 0553        Subjective: feeling better no acute complains.   Objective: Physical Exam: Vitals:   03/11/16 2030 03/12/16 0015 03/12/16 0428 03/12/16 1445  BP: 132/86 130/85 118/74 120/78  Pulse: 73 74 68 76  Resp: 16 13 12  (!) 23  Temp: 98.4 F (36.9 C) 97.8 F (36.6 C) 97.7 F (36.5 C) 98.2 F (36.8 C)  TempSrc: Oral Oral Oral Oral  SpO2: 96% 97% 95% 94%  Weight:      Height:        Intake/Output Summary (Last 24 hours) at 03/12/16 1526 Last data filed at 03/12/16 1447  Gross per 24 hour  Intake              360 ml  Output              200 ml  Net              160 ml   Filed Weights   03/10/16 0323 03/10/16 1815 03/11/16 0412  Weight: (!) 156 kg (344 lb) (!) 156 kg (344 lb) 110.7 kg (244 lb)    General: Alert, Awake and Oriented to Time, Place and Person. Appear in mild distress, affect appropriate Eyes: PERRL, Conjunctiva normal ENT: Oral Mucosa clear moist. Neck: difficult to assess  JVD, no Abnormal Mass Or lumps Cardiovascular:  S1 and S2 Present, no Murmur, Respiratory: Bilateral Air entry equal and Decreased, no use of accessory muscle, Clear to Auscultation, no Crackles, no wheezes Abdomen: Bowel Sound present, Soft and no tenderness Skin: no redness, no Rash, no induration Extremities: bilateral  Pedal edema, no calf tenderness Neurologic: Grossly no focal neuro deficit. Bilaterally Equal motor strength  Data Reviewed: CBC:  Recent Labs Lab 03/10/16 0350 03/11/16 0614 03/12/16 0416  WBC 4.0 4.1 4.7  NEUTROABS 2.4  --  2.3  HGB 13.9 11.8* 11.0*  HCT 43.5 37.3 34.4*  MCV 100.2* 99.5 97.2  PLT 96* 87* 95*   Basic Metabolic Panel:  Recent Labs Lab  03/10/16 0350 03/10/16 0706 03/11/16 0614 03/12/16 0416  NA 139  --  144 144  K 4.8  --  4.2 4.0  CL 103  --  106 104  CO2 26  --  32 35*  GLUCOSE 108*  --  75 81  BUN 19  --  9 8  CREATININE 1.62*  --  0.97 0.96  CALCIUM 6.5*  --  6.5* 6.7*  MG  --  1.4*  --  2.2  PHOS  --  6.2*  --  3.7    Liver Function Tests:  Recent Labs Lab 03/10/16 0350  AST 34  ALT 11*  ALKPHOS 92  BILITOT 0.4  PROT 7.1  ALBUMIN 3.8   No results for input(s): LIPASE, AMYLASE in the last 168 hours. No results for input(s): AMMONIA in the last 168 hours. Coagulation Profile: No results for input(s): INR, PROTIME in the last 168 hours. Cardiac Enzymes:  Recent Labs Lab 03/10/16 0524  CKTOTAL 623*   BNP (last 3 results) No results for input(s): PROBNP in the last 8760 hours.  CBG: No results for input(s): GLUCAP in the last 168 hours.  Studies: No results found.   Scheduled Meds: . amLODipine  5 mg Oral Daily  . celecoxib  200 mg Oral Daily  . DULoxetine  60 mg Oral Daily  . furosemide  20 mg Oral Daily  . heparin  5,000 Units Subcutaneous Q8H  . Influenza vac split quadrivalent PF  0.5 mL Intramuscular Tomorrow-1000  . levothyroxine  300 mcg Oral QAC breakfast  . losartan  100 mg Oral Daily  . magnesium sulfate 1 - 4 g bolus IVPB  4 g Intravenous Once  . mirabegron ER  50 mg Oral Daily  . pantoprazole  40 mg Oral QHS  . pregabalin  200 mg Oral TID   Continuous Infusions: . naLOXone (NARCAN) adult infusion for OVERDOSE Stopped (03/10/16 1545)   PRN Meds: acetaminophen, albuterol, cyclobenzaprine, oxyCODONE  Time spent: 30 minutes  Author: Berle Mull, MD [facility] Pager: [phone] 03/12/2016 3:26 PM  If 7PM-7AM, please contact night-coverage at [email or web address], password [facility]

## 2016-03-13 ENCOUNTER — Encounter: Payer: Self-pay | Admitting: Pulmonary Disease

## 2016-03-13 NOTE — Telephone Encounter (Signed)
Called and spoke to pt. Appt made with BQ on 04/13/16, pt states she only wants to see BQ. Pt refused an earlier appt with APP. Advised pt to call back to see if there have been cancellations and can be worked in sooner. Pt verbalized understanding and denied any further questions or concerns at this time.

## 2016-03-14 ENCOUNTER — Other Ambulatory Visit: Payer: Self-pay | Admitting: Oncology

## 2016-03-14 DIAGNOSIS — Z1231 Encounter for screening mammogram for malignant neoplasm of breast: Secondary | ICD-10-CM

## 2016-03-15 LAB — CULTURE, BLOOD (ROUTINE X 2)
CULTURE: NO GROWTH
CULTURE: NO GROWTH

## 2016-04-07 ENCOUNTER — Ambulatory Visit
Admission: RE | Admit: 2016-04-07 | Discharge: 2016-04-07 | Disposition: A | Discharge status: Home / Self Care | Payer: Federal, State, Local not specified - PPO | Source: Ambulatory Visit | Attending: Oncology | Admitting: Oncology

## 2016-04-07 DIAGNOSIS — Z1231 Encounter for screening mammogram for malignant neoplasm of breast: Secondary | ICD-10-CM

## 2016-04-13 ENCOUNTER — Inpatient Hospital Stay: Payer: Federal, State, Local not specified - PPO | Admitting: Pulmonary Disease

## 2016-04-23 ENCOUNTER — Encounter (HOSPITAL_COMMUNITY): Payer: Self-pay | Admitting: Emergency Medicine

## 2016-04-23 ENCOUNTER — Inpatient Hospital Stay (HOSPITAL_COMMUNITY)
Admission: EM | Admit: 2016-04-23 | Discharge: 2016-05-05 | DRG: 871 | Disposition: A | Discharge status: Skilled Nursing Facility | Payer: Federal, State, Local not specified - PPO | Attending: Nephrology | Admitting: Nephrology

## 2016-04-23 ENCOUNTER — Emergency Department (HOSPITAL_COMMUNITY): Payer: Federal, State, Local not specified - PPO

## 2016-04-23 DIAGNOSIS — R32 Unspecified urinary incontinence: Secondary | ICD-10-CM | POA: Diagnosis present

## 2016-04-23 DIAGNOSIS — I5043 Acute on chronic combined systolic (congestive) and diastolic (congestive) heart failure: Secondary | ICD-10-CM | POA: Diagnosis not present

## 2016-04-23 DIAGNOSIS — Z01810 Encounter for preprocedural cardiovascular examination: Secondary | ICD-10-CM

## 2016-04-23 DIAGNOSIS — Z6841 Body Mass Index (BMI) 40.0 and over, adult: Secondary | ICD-10-CM

## 2016-04-23 DIAGNOSIS — R1312 Dysphagia, oropharyngeal phase: Secondary | ICD-10-CM

## 2016-04-23 DIAGNOSIS — J383 Other diseases of vocal cords: Secondary | ICD-10-CM | POA: Diagnosis not present

## 2016-04-23 DIAGNOSIS — I482 Chronic atrial fibrillation, unspecified: Secondary | ICD-10-CM | POA: Diagnosis present

## 2016-04-23 DIAGNOSIS — J45909 Unspecified asthma, uncomplicated: Secondary | ICD-10-CM

## 2016-04-23 DIAGNOSIS — I471 Supraventricular tachycardia: Secondary | ICD-10-CM | POA: Diagnosis not present

## 2016-04-23 DIAGNOSIS — I10 Essential (primary) hypertension: Secondary | ICD-10-CM | POA: Diagnosis present

## 2016-04-23 DIAGNOSIS — D696 Thrombocytopenia, unspecified: Secondary | ICD-10-CM | POA: Diagnosis present

## 2016-04-23 DIAGNOSIS — I472 Ventricular tachycardia: Secondary | ICD-10-CM | POA: Diagnosis not present

## 2016-04-23 DIAGNOSIS — I214 Non-ST elevation (NSTEMI) myocardial infarction: Secondary | ICD-10-CM | POA: Diagnosis present

## 2016-04-23 DIAGNOSIS — J9621 Acute and chronic respiratory failure with hypoxia: Secondary | ICD-10-CM | POA: Diagnosis present

## 2016-04-23 DIAGNOSIS — G40909 Epilepsy, unspecified, not intractable, without status epilepticus: Secondary | ICD-10-CM

## 2016-04-23 DIAGNOSIS — R1310 Dysphagia, unspecified: Secondary | ICD-10-CM | POA: Diagnosis present

## 2016-04-23 DIAGNOSIS — R778 Other specified abnormalities of plasma proteins: Secondary | ICD-10-CM | POA: Diagnosis present

## 2016-04-23 DIAGNOSIS — J9601 Acute respiratory failure with hypoxia: Secondary | ICD-10-CM

## 2016-04-23 DIAGNOSIS — G92 Toxic encephalopathy: Secondary | ICD-10-CM | POA: Diagnosis present

## 2016-04-23 DIAGNOSIS — R4182 Altered mental status, unspecified: Secondary | ICD-10-CM | POA: Diagnosis present

## 2016-04-23 DIAGNOSIS — Z4659 Encounter for fitting and adjustment of other gastrointestinal appliance and device: Secondary | ICD-10-CM

## 2016-04-23 DIAGNOSIS — A419 Sepsis, unspecified organism: Secondary | ICD-10-CM

## 2016-04-23 DIAGNOSIS — D6489 Other specified anemias: Secondary | ICD-10-CM | POA: Diagnosis present

## 2016-04-23 DIAGNOSIS — G8929 Other chronic pain: Secondary | ICD-10-CM | POA: Diagnosis present

## 2016-04-23 DIAGNOSIS — R748 Abnormal levels of other serum enzymes: Secondary | ICD-10-CM | POA: Diagnosis not present

## 2016-04-23 DIAGNOSIS — R569 Unspecified convulsions: Secondary | ICD-10-CM | POA: Diagnosis present

## 2016-04-23 DIAGNOSIS — I219 Acute myocardial infarction, unspecified: Secondary | ICD-10-CM | POA: Diagnosis not present

## 2016-04-23 DIAGNOSIS — T50901A Poisoning by unspecified drugs, medicaments and biological substances, accidental (unintentional), initial encounter: Secondary | ICD-10-CM

## 2016-04-23 DIAGNOSIS — L304 Erythema intertrigo: Secondary | ICD-10-CM | POA: Diagnosis not present

## 2016-04-23 DIAGNOSIS — I5031 Acute diastolic (congestive) heart failure: Secondary | ICD-10-CM

## 2016-04-23 DIAGNOSIS — Z79899 Other long term (current) drug therapy: Secondary | ICD-10-CM

## 2016-04-23 DIAGNOSIS — Z9884 Bariatric surgery status: Secondary | ICD-10-CM

## 2016-04-23 DIAGNOSIS — E669 Obesity, unspecified: Secondary | ICD-10-CM | POA: Diagnosis present

## 2016-04-23 DIAGNOSIS — I442 Atrioventricular block, complete: Secondary | ICD-10-CM | POA: Diagnosis present

## 2016-04-23 DIAGNOSIS — I428 Other cardiomyopathies: Secondary | ICD-10-CM | POA: Diagnosis present

## 2016-04-23 DIAGNOSIS — J9622 Acute and chronic respiratory failure with hypercapnia: Secondary | ICD-10-CM

## 2016-04-23 DIAGNOSIS — I248 Other forms of acute ischemic heart disease: Secondary | ICD-10-CM | POA: Diagnosis present

## 2016-04-23 DIAGNOSIS — Z452 Encounter for adjustment and management of vascular access device: Secondary | ICD-10-CM

## 2016-04-23 DIAGNOSIS — I480 Paroxysmal atrial fibrillation: Secondary | ICD-10-CM | POA: Diagnosis present

## 2016-04-23 DIAGNOSIS — I4891 Unspecified atrial fibrillation: Secondary | ICD-10-CM | POA: Diagnosis present

## 2016-04-23 DIAGNOSIS — J690 Pneumonitis due to inhalation of food and vomit: Secondary | ICD-10-CM | POA: Diagnosis present

## 2016-04-23 DIAGNOSIS — J4489 Other specified chronic obstructive pulmonary disease: Secondary | ICD-10-CM

## 2016-04-23 DIAGNOSIS — I5021 Acute systolic (congestive) heart failure: Secondary | ICD-10-CM | POA: Diagnosis not present

## 2016-04-23 DIAGNOSIS — I110 Hypertensive heart disease with heart failure: Secondary | ICD-10-CM | POA: Diagnosis present

## 2016-04-23 DIAGNOSIS — J9690 Respiratory failure, unspecified, unspecified whether with hypoxia or hypercapnia: Secondary | ICD-10-CM

## 2016-04-23 DIAGNOSIS — G894 Chronic pain syndrome: Secondary | ICD-10-CM | POA: Diagnosis not present

## 2016-04-23 DIAGNOSIS — R159 Full incontinence of feces: Secondary | ICD-10-CM | POA: Diagnosis present

## 2016-04-23 DIAGNOSIS — Z23 Encounter for immunization: Secondary | ICD-10-CM

## 2016-04-23 DIAGNOSIS — I272 Pulmonary hypertension, unspecified: Secondary | ICD-10-CM | POA: Diagnosis present

## 2016-04-23 DIAGNOSIS — T50901S Poisoning by unspecified drugs, medicaments and biological substances, accidental (unintentional), sequela: Secondary | ICD-10-CM | POA: Diagnosis not present

## 2016-04-23 DIAGNOSIS — E162 Hypoglycemia, unspecified: Secondary | ICD-10-CM | POA: Diagnosis not present

## 2016-04-23 DIAGNOSIS — E876 Hypokalemia: Secondary | ICD-10-CM | POA: Diagnosis present

## 2016-04-23 DIAGNOSIS — I2510 Atherosclerotic heart disease of native coronary artery without angina pectoris: Secondary | ICD-10-CM | POA: Diagnosis present

## 2016-04-23 DIAGNOSIS — M797 Fibromyalgia: Secondary | ICD-10-CM

## 2016-04-23 DIAGNOSIS — J9602 Acute respiratory failure with hypercapnia: Secondary | ICD-10-CM | POA: Diagnosis not present

## 2016-04-23 DIAGNOSIS — Z791 Long term (current) use of non-steroidal anti-inflammatories (NSAID): Secondary | ICD-10-CM

## 2016-04-23 DIAGNOSIS — R7989 Other specified abnormal findings of blood chemistry: Secondary | ICD-10-CM | POA: Diagnosis present

## 2016-04-23 DIAGNOSIS — G928 Other toxic encephalopathy: Secondary | ICD-10-CM | POA: Diagnosis present

## 2016-04-23 DIAGNOSIS — E890 Postprocedural hypothyroidism: Secondary | ICD-10-CM | POA: Diagnosis present

## 2016-04-23 DIAGNOSIS — J449 Chronic obstructive pulmonary disease, unspecified: Secondary | ICD-10-CM

## 2016-04-23 HISTORY — DX: Unspecified intestinal obstruction, unspecified as to partial versus complete obstruction: K56.609

## 2016-04-23 LAB — COMPREHENSIVE METABOLIC PANEL
ALBUMIN: 3.8 g/dL (ref 3.5–5.0)
ALT: 14 U/L (ref 14–54)
AST: 34 U/L (ref 15–41)
Alkaline Phosphatase: 90 U/L (ref 38–126)
Anion gap: 15 (ref 5–15)
BILIRUBIN TOTAL: 0.9 mg/dL (ref 0.3–1.2)
BUN: 11 mg/dL (ref 6–20)
CO2: 24 mmol/L (ref 22–32)
Calcium: 8.2 mg/dL — ABNORMAL LOW (ref 8.9–10.3)
Chloride: 98 mmol/L — ABNORMAL LOW (ref 101–111)
Creatinine, Ser: 1.13 mg/dL — ABNORMAL HIGH (ref 0.44–1.00)
GFR calc Af Amer: 59 mL/min — ABNORMAL LOW (ref >60)
GFR calc non Af Amer: 51 mL/min — ABNORMAL LOW (ref >60)
GLUCOSE: 126 mg/dL — AB (ref 65–99)
POTASSIUM: 3.2 mmol/L — AB (ref 3.5–5.1)
Sodium: 137 mmol/L (ref 135–145)
TOTAL PROTEIN: 7.5 g/dL (ref 6.5–8.1)

## 2016-04-23 LAB — CBC WITH DIFFERENTIAL/PLATELET
BASOS ABS: 0 10*3/uL (ref 0.0–0.1)
Basophils Relative: 0 %
EOS PCT: 0 %
Eosinophils Absolute: 0 10*3/uL (ref 0.0–0.7)
HCT: 43.4 % (ref 36.0–46.0)
Hemoglobin: 14.3 g/dL (ref 12.0–15.0)
LYMPHS PCT: 4 %
Lymphs Abs: 0.6 10*3/uL — ABNORMAL LOW (ref 0.7–4.0)
MCH: 32 pg (ref 26.0–34.0)
MCHC: 32.9 g/dL (ref 30.0–36.0)
MCV: 97.1 fL (ref 78.0–100.0)
MONO ABS: 1.5 10*3/uL — AB (ref 0.1–1.0)
MONOS PCT: 10 %
Neutro Abs: 12.5 10*3/uL — ABNORMAL HIGH (ref 1.7–7.7)
Neutrophils Relative %: 86 %
Platelets: 160 10*3/uL (ref 150–400)
RBC: 4.47 MIL/uL (ref 3.87–5.11)
RDW: 14.3 % (ref 11.5–15.5)
WBC: 14.6 10*3/uL — ABNORMAL HIGH (ref 4.0–10.5)

## 2016-04-23 LAB — I-STAT ARTERIAL BLOOD GAS, ED
Acid-Base Excess: 3 mmol/L — ABNORMAL HIGH (ref 0.0–2.0)
BICARBONATE: 28.9 mmol/L — AB (ref 20.0–28.0)
O2 Saturation: 100 %
PCO2 ART: 46.2 mmHg (ref 32.0–48.0)
PO2 ART: 266 mmHg — AB (ref 83.0–108.0)
Patient temperature: 98.7
TCO2: 30 mmol/L (ref 0–100)
pH, Arterial: 7.405 (ref 7.350–7.450)

## 2016-04-23 LAB — I-STAT TROPONIN, ED: Troponin i, poc: 0.87 ng/mL (ref 0.00–0.08)

## 2016-04-23 LAB — I-STAT CG4 LACTIC ACID, ED: Lactic Acid, Venous: 1.51 mmol/L (ref 0.5–1.9)

## 2016-04-23 LAB — MAGNESIUM: Magnesium: 1.2 mg/dL — ABNORMAL LOW (ref 1.7–2.4)

## 2016-04-23 LAB — CK: Total CK: 201 U/L (ref 38–234)

## 2016-04-23 LAB — CBG MONITORING, ED: Glucose-Capillary: 114 mg/dL — ABNORMAL HIGH (ref 65–99)

## 2016-04-23 LAB — TRIGLYCERIDES: Triglycerides: 64 mg/dL (ref <150)

## 2016-04-23 MED ORDER — FENTANYL 2500MCG IN NS 250ML (10MCG/ML) PREMIX INFUSION
10.0000 ug/h | INTRAVENOUS | Status: DC
  Administered 2016-04-23: 40 ug/h via INTRAVENOUS
  Filled 2016-04-23: qty 250

## 2016-04-23 MED ORDER — PROPOFOL 1000 MG/100ML IV EMUL
0.0000 ug/kg/min | INTRAVENOUS | Status: DC
  Administered 2016-04-23: 5 ug/kg/min via INTRAVENOUS
  Administered 2016-04-24: 45 ug/kg/min via INTRAVENOUS
  Administered 2016-04-24: 20 ug/kg/min via INTRAVENOUS
  Administered 2016-04-24: 45 ug/kg/min via INTRAVENOUS
  Administered 2016-04-24: 30 ug/kg/min via INTRAVENOUS
  Administered 2016-04-24: 25 ug/kg/min via INTRAVENOUS
  Administered 2016-04-25: 30 ug/kg/min via INTRAVENOUS
  Filled 2016-04-23 (×8): qty 100

## 2016-04-23 MED ORDER — FAMOTIDINE 40 MG/5ML PO SUSR
20.0000 mg | Freq: Two times a day (BID) | ORAL | Status: DC
  Administered 2016-04-24 – 2016-04-26 (×7): 20 mg
  Filled 2016-04-23 (×7): qty 2.5

## 2016-04-23 MED ORDER — POTASSIUM CHLORIDE 2 MEQ/ML IV SOLN
Freq: Once | INTRAVENOUS | Status: AC
  Administered 2016-04-23: 23:00:00 via INTRAVENOUS
  Filled 2016-04-23: qty 1000

## 2016-04-23 MED ORDER — FENTANYL CITRATE (PF) 100 MCG/2ML IJ SOLN
100.0000 ug | INTRAMUSCULAR | Status: DC | PRN
  Administered 2016-04-24 – 2016-04-25 (×2): 100 ug via INTRAVENOUS
  Filled 2016-04-23 (×2): qty 2

## 2016-04-23 MED ORDER — MIDAZOLAM HCL 2 MG/2ML IJ SOLN
INTRAMUSCULAR | Status: AC
  Administered 2016-04-23: 2 mg
  Filled 2016-04-23: qty 2

## 2016-04-23 MED ORDER — SODIUM CHLORIDE 0.9 % IV SOLN
0.5000 mg/h | INTRAVENOUS | Status: DC
  Administered 2016-04-23: 1 mg/h via INTRAVENOUS
  Filled 2016-04-23: qty 10

## 2016-04-23 MED ORDER — DEXTROSE 5 % IV SOLN
2.0000 g | Freq: Once | INTRAVENOUS | Status: AC
  Administered 2016-04-23: 2 g via INTRAVENOUS
  Filled 2016-04-23: qty 2

## 2016-04-23 MED ORDER — SODIUM CHLORIDE 0.9 % IV SOLN
750.0000 mg | Freq: Two times a day (BID) | INTRAVENOUS | Status: DC
  Administered 2016-04-24 – 2016-04-27 (×7): 750 mg via INTRAVENOUS
  Filled 2016-04-23 (×7): qty 7.5

## 2016-04-23 MED ORDER — LEVOFLOXACIN IN D5W 750 MG/150ML IV SOLN
750.0000 mg | INTRAVENOUS | Status: DC
  Filled 2016-04-23 (×2): qty 150

## 2016-04-23 MED ORDER — LEVOFLOXACIN IN D5W 750 MG/150ML IV SOLN
750.0000 mg | Freq: Once | INTRAVENOUS | Status: AC
  Administered 2016-04-23: 750 mg via INTRAVENOUS
  Filled 2016-04-23: qty 150

## 2016-04-23 MED ORDER — SODIUM CHLORIDE 0.9 % IV SOLN
1500.0000 mg | Freq: Once | INTRAVENOUS | Status: AC
  Administered 2016-04-24: 1500 mg via INTRAVENOUS
  Filled 2016-04-23: qty 15

## 2016-04-23 MED ORDER — VANCOMYCIN HCL IN DEXTROSE 1-5 GM/200ML-% IV SOLN
1000.0000 mg | Freq: Once | INTRAVENOUS | Status: AC
  Administered 2016-04-23: 1000 mg via INTRAVENOUS
  Filled 2016-04-23 (×2): qty 200

## 2016-04-23 MED ORDER — ACETAMINOPHEN 650 MG RE SUPP
650.0000 mg | Freq: Once | RECTAL | Status: AC
  Administered 2016-04-23: 650 mg via RECTAL
  Filled 2016-04-23: qty 1

## 2016-04-23 MED ORDER — SODIUM CHLORIDE 0.9 % IV BOLUS (SEPSIS)
1000.0000 mL | Freq: Once | INTRAVENOUS | Status: AC
  Administered 2016-04-23: 1000 mL via INTRAVENOUS

## 2016-04-23 MED ORDER — DEXTROSE 5 % IV SOLN
2.0000 g | Freq: Three times a day (TID) | INTRAVENOUS | Status: DC
  Administered 2016-04-24 (×2): 2 g via INTRAVENOUS
  Filled 2016-04-23 (×3): qty 2

## 2016-04-23 MED ORDER — MIDAZOLAM HCL 2 MG/2ML IJ SOLN
INTRAMUSCULAR | Status: AC
  Filled 2016-04-23: qty 2

## 2016-04-23 MED ORDER — MAGNESIUM SULFATE 4 GM/100ML IV SOLN
4.0000 g | Freq: Once | INTRAVENOUS | Status: AC
  Administered 2016-04-24: 4 g via INTRAVENOUS
  Filled 2016-04-23: qty 100

## 2016-04-23 MED ORDER — VANCOMYCIN HCL IN DEXTROSE 1-5 GM/200ML-% IV SOLN
1000.0000 mg | Freq: Once | INTRAVENOUS | Status: AC
  Administered 2016-04-23: 1000 mg via INTRAVENOUS
  Filled 2016-04-23: qty 200

## 2016-04-23 MED ORDER — FENTANYL CITRATE (PF) 100 MCG/2ML IJ SOLN
100.0000 ug | INTRAMUSCULAR | Status: DC | PRN
  Filled 2016-04-23: qty 2

## 2016-04-23 MED ORDER — VANCOMYCIN HCL 10 G IV SOLR
1250.0000 mg | INTRAVENOUS | Status: DC
  Administered 2016-04-24: 1250 mg via INTRAVENOUS
  Filled 2016-04-23 (×2): qty 1250

## 2016-04-23 MED ORDER — SODIUM CHLORIDE 0.9 % IV SOLN: 250.0000 mL | INTRAVENOUS | Status: DC | PRN

## 2016-04-23 NOTE — ED Notes (Signed)
All labs including both sets of cultures are drawn

## 2016-04-23 NOTE — ED Notes (Signed)
Second phlebotomist to attempt blood work

## 2016-04-23 NOTE — Progress Notes (Signed)
eLink Physician-Brief Progress Note Patient Name: Jillian Eaton DOB: 01-05-1951 MRN: [id number]   Date of Service  04/23/2016  HPI/Events of Note  Neurologist contacted for consult regarding possible seizures. Also serum magnesium noted to be 1.2. K 3.2 but 68mEq KCl already ordered.  eICU Interventions  Magnesium Sulfate 4gm ordered IV.      Intervention Category Major Interventions: Seizures - evaluation and management;Electrolyte abnormality - evaluation and management  Tera Partridge 04/23/2016, 10:58 PM

## 2016-04-23 NOTE — Progress Notes (Signed)
Pharmacy Antibiotic Note  Jillian Eaton is a 66 y.o. female admitted on 04/23/2016 with sepsis.  Pharmacy has been consulted for Vancomycin, levofloxacin and aztreonam dosing.  First doses have been given in the ED.  Plan: Aztreonam 2g IV q8h Levofloxacin 750mg  IV q24h Vancomycin 1g IV x1 again to complete ~20mg /kg load, then 1250mg  IV q24h per obesity nomogram Follow c/s, clinical progression, renal function, level at SS  Height: 5\' 8"  (172.7 cm) Weight: 244 lb 0.8 oz (110.7 kg) IBW/kg (Calculated) : 63.9  Temp (24hrs), Avg:103.9 F (39.9 C), Min:103.9 F (39.9 C), Max:103.9 F (39.9 C)   Recent Labs Lab 04/23/16 2001 04/23/16 2015  WBC 14.6*  --   CREATININE 1.13*  --   LATICACIDVEN  --  1.51    Estimated Creatinine Clearance: 66.4 mL/min (by C-G formula based on SCr of 1.13 mg/dL (H)).    Allergies  Allergen Reactions  . Penicillins Other (See Comments)    intolerance    Antimicrobials this admission: Aztreonam 3/4>> Levoflox 3/4>> Vancomycin 3/4>>  Dose adjustments this admission: n/a  Microbiology results: 3/4 BCx: 3/4 CDiff: 3/4 urine:  Thank you for allowing pharmacy to be a part of this patient's care.  [place] D. Jerriann Schrom, PharmD, BCPS Clinical Pharmacist Pager: [phone] 04/23/2016 8:56 PM

## 2016-04-23 NOTE — ED Notes (Signed)
Called main lab to add on  HIV and TRI

## 2016-04-23 NOTE — ED Notes (Signed)
Pt daughter lives 3 1/2 hrs away and is on her way here, would like an update on her mom.   [phone]

## 2016-04-23 NOTE — Consult Note (Signed)
NEURO HOSPITALIST CONSULT NOTE   Requestig physician: Dr. Vaughan Browner  Reason for Consult: Found unresponsive at home with seizure-like activity  History obtained from:  Chart     HPI:                                                                                                                                          Jillian BOCHENEK is a 66 y.o. female who was found unresponsive at home having seizure like activity. She was actively seizing and saturated in stool and urine on arrival of EMS. Glucose on site was normal. She was transported to the ED by EMS, who administered 5 mg Versed on site which aborted seizure activity. An additional 5 mg Versed was given en route for sedation. She was unresponsive on arrival to the ED with an ET tube in place. She was saturated in stool and urine. Had a fever of 103.9 F, pulse of 123, respirations of 47 and BP of 121/106. Glucose was 114. Na 137, Ca 8.2. Low magnesium of 1.2. Troponin I elevated at 0.87. Lactic acid was in the normal range (1.51). Leukocytosis on CBC. She was diagnosed with sepsis and antibiotics started. In the ED she was dyssynchronous with the ventilator with a respiratory rate in the 50s. She was started on Fentanyl and Versed infusions. She was noted to move both upper extremities spontaneously. CT head was negative for acute abnormality.   Her PMHx includes restrictive lung disease secondary to morbid obesity, asthma, chronic pain syndrome on chronic narcotics, fibromyalgia, hypothyroidism and chronic HFpEF. Of note, she was admitted in January of this year for an unintentional narcotic overdose and hypoxemic/hypercapneic respiratory failure.    Neurology was consulted to assess for underlying etiology of her seizure. An EEG has been ordered.   Past Medical History:  Diagnosis Date  . Anemia   . Asthma   . Chronic headache   . DOE (dyspnea on exertion)    2D ECHO, 02/12/2012 - EF 60-65%, moderate concentric  hypertrophy  . Fibromyalgia    nerve pain"left side at waist level" "can't lay on that side without pain" , "HOB elevation helps"  . Fibromyalgia   . Heart murmur   . Hematuria - cause not known   . History of kidney stones    x 2 '13, '14 surgery to remove  . Hypertension   . SOB (shortness of breath)    NUCLEAR STRESS TEST, 03/02/2009 - no ischemic ST changes or symptoms  . Thyroid disease    "goiter"  . Transfusion history    10 yrs+  . Urinary incontinence, functional     Past Surgical History:  Procedure Laterality Date  . CARDIAC CATHETERIZATION  04/04/2010   No significant obstructive coronary artery disease  . CHOLECYSTECTOMY  1990  . COLONOSCOPY W/ POLYPECTOMY    . COLONOSCOPY WITH PROPOFOL N/A 04/10/2014   Procedure: COLONOSCOPY WITH PROPOFOL;  Surgeon: Beryle Beams, MD;  Location: [facility] ENDOSCOPY;  Service: Endoscopy;  Laterality: N/A;  . DIAGNOSTIC LAPAROSCOPY     x2 bowel obstructions(adhesions)  . 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 THYROIDECTOMY    . TUBAL LIGATION  1986    Family History  Problem Relation Age of Onset  . Diabetes Mother   . Epilepsy Mother   . Cancer Mother     Breast  . Hypertension Mother   . Breast cancer Mother   . Kidney disease Father   . Diabetes Father   . Hypertension Father   . Asthma Father   . Heart disease Father   . Epilepsy Sister   . Cancer Maternal Grandmother   . Breast cancer Maternal Grandmother   . Cancer Paternal Grandmother   . Breast cancer Paternal Grandmother    Social History:  reports that she has never smoked. She has never used smokeless tobacco. She reports that she uses drugs, including Oxycodone. She reports that she does not drink alcohol.  Allergies  Allergen Reactions  . Penicillins Other (See Comments)    intolerance    MEDICATIONS:                                                                                                                      Prior to Admission:  Prescriptions Prior to Admission  Medication Sig Dispense Refill Last Dose  . acetaminophen (TYLENOL) 500 MG tablet Take 500 mg by mouth every 6 (six) hours as needed for mild pain.   03/09/2016 at Unknown time  . acetaminophen (TYLENOL) 650 MG CR tablet Take 650 mg by mouth every 6 (six) hours.   Taking  . albuterol (PROAIR HFA) 108 (90 BASE) MCG/ACT inhaler Inhale 2 puffs into the lungs every 6 (six) hours as needed for wheezing or shortness of breath.    Taking  . amLODipine (NORVASC) 5 MG tablet Take 5 mg by mouth daily.  1 Taking  . amLODipine (NORVASC) 5 MG tablet Take 5 mg by mouth daily.   03/09/2016 at Unknown time  . budesonide-formoterol (SYMBICORT) 160-4.5 MCG/ACT inhaler Inhale 2 puffs into the lungs 2 (two) times daily as needed (shortness of breath.).    Taking  . calcium carbonate (CALCIUM 600) 600 MG TABS tablet Take 600 mg by mouth 2 (two) times daily with a meal.   03/09/2016 at Unknown time  . calcium carbonate (OS-CAL) 600 MG TABS Take 600 mg by mouth 2 (two) times daily. Lunch and dinner   Taking  . celecoxib (CELEBREX) 200 MG capsule Take 200 mg by mouth every morning.    Taking  . celecoxib (CELEBREX) 200 MG capsule Take 200 mg by mouth daily.    03/09/2016 at Unknown time  . cyanocobalamin (,VITAMIN B-12,) 1000 MCG/ML injection Inject 1,000 mcg into  the skin every 30 (thirty) days.    Taking  . cyclobenzaprine (FLEXERIL) 10 MG tablet Take 1 tablet by mouth 3 (three) times daily.    Taking  . cyclobenzaprine (FLEXERIL) 10 MG tablet Take 1 tablet (10 mg total) by mouth 3 (three) times daily as needed for muscle spasms. 30 tablet 0   . DULoxetine (CYMBALTA) 60 MG capsule Take 60 mg by mouth every evening.    Taking  . DULoxetine (CYMBALTA) 60 MG capsule Take 60 mg by mouth daily.   03/09/2016 at Unknown time  . Eszopiclone (ESZOPICLONE) 3 MG TABS Take 3 mg by mouth at bedtime. Take immediately before bedtime   Taking  . Eszopiclone (ESZOPICLONE) 3 MG TABS  Take 1 tablet (3 mg total) by mouth at bedtime as needed. Take immediately before bedtime  0   . furosemide (LASIX) 20 MG tablet Take 1 tablet (20 mg total) by mouth daily. 30 tablet 0   . levothyroxine (SYNTHROID, LEVOTHROID) 150 MCG tablet Take 2 tablets (300 mcg total) by mouth daily before breakfast. 60 tablet 0   . levothyroxine (SYNTHROID, LEVOTHROID) 300 MCG tablet Take 300 mcg by mouth daily before breakfast.   Taking  . losartan (COZAAR) 100 MG tablet Take 100 mg by mouth every morning.    Taking  . losartan (COZAAR) 100 MG tablet Take 100 mg by mouth daily.   03/09/2016 at Unknown time  . mirabegron ER (MYRBETRIQ) 50 MG TB24 tablet Take 50 mg by mouth daily.   03/09/2016 at Unknown time  . mirabegron ER (MYRBETRIQ) 50 MG TB24 Take 50 mg by mouth every morning.    Taking  . oxyCODONE (OXY IR/ROXICODONE) 5 MG immediate release tablet Take 1 tablet (5 mg total) by mouth every 6 (six) hours as needed for moderate pain. 20 tablet 0   . oxyCODONE-acetaminophen (PERCOCET) 10-325 MG tablet Take 1 tablet by mouth every 4 (four) hours as needed for pain.   Taking  . pantoprazole (PROTONIX) 40 MG tablet Take 1 tablet (40 mg total) by mouth at bedtime. 30 tablet 0   . pregabalin (LYRICA) 200 MG capsule Take 200 mg by mouth 3 (three) times daily.     Taking  . pregabalin (LYRICA) 200 MG capsule Take 200 mg by mouth 3 (three) times daily.   03/09/2016 at Unknown time  . Vitamin D, Ergocalciferol, (DRISDOL) 50000 units CAPS capsule Take 50,000 Units by mouth every 7 (seven) days.   Past Week at Unknown time  . Vitamin D, Ergocalciferol, (DRISDOL) 50000 UNITS CAPS Take 50,000 Units by mouth 2 (two) times a week. Tuesday and Thursday   Taking   Scheduled: . aztreonam  2 g Intravenous Q8H  . famotidine  20 mg Per Tube BID  . levETIRAcetam  750 mg Intravenous BID  . levofloxacin (LEVAQUIN) IV  750 mg Intravenous Q24H  . midazolam      . vancomycin  1,250 mg Intravenous Q24H     ROS:  Unable to obtain due to sedation and intubation.   Blood pressure 119/89, pulse 64, temperature 102.9 F (39.4 C), temperature source Axillary, resp. rate (!) 31, height 5\' 8"  (1.727 m), weight 110.7 kg (244 lb 0.8 oz), SpO2 100 %.  General Examination:                                                                                                      General: Supermorbid obesity HEENT-  Normocephalic/atraumatic.  Lungs- Intubated on ventilator. Extremities- Prominent adiposity. Warm and well perfused.  Neurological Examination Mental Status: Intubated and sedated. With propofol held for 3 minutes, she moves semipurposefully to noxious stimuli but does not open eyes or attempt to communicate.  Cranial Nerves: II: No blink to threat. Does not fixate. PERRL.  III,IV, VI: Eyes conjugate at the midline without spontaneous movement. No doll's eye reflex.   V,VII: Face flaccidly symmetric. No response to tactile stimulation. VIII: Unable to test IX,X: Intubated XI: Increased tone left sternocleidomastoid.  XII: Intubated Motor/Sensory: Slight movement to noxious stimuli in all 4 extremities with 1-2/5 strength 3 minutes after holding propofol. No asymmetry.  Deep Tendon Reflexes: 2+ biceps and brachioradialis bilaterally. Unable to elicit patellar reflexes in the context of prominent adiposity. 2+ achilles bilaterally. Toes equivocal bilaterally.  Cerebellar/Gait: Unable to assess.   Lab Results: Basic Metabolic Panel:  Recent Labs Lab 04/23/16 2001  NA 137  K 3.2*  CL 98*  CO2 24  GLUCOSE 126*  BUN 11  CREATININE 1.13*  CALCIUM 8.2*  MG 1.2*    Liver Function Tests:  Recent Labs Lab 04/23/16 2001  AST 34  ALT 14  ALKPHOS 90  BILITOT 0.9  PROT 7.5  ALBUMIN 3.8   No results for input(s): LIPASE, AMYLASE in the last 168 hours. No results for input(s):  AMMONIA in the last 168 hours.  CBC:  Recent Labs Lab 04/23/16 2001  WBC 14.6*  NEUTROABS 12.5*  HGB 14.3  HCT 43.4  MCV 97.1  PLT 160    Cardiac Enzymes:  Recent Labs Lab 04/23/16 2001  CKTOTAL 201    Lipid Panel:  Recent Labs Lab 04/23/16 2001  TRIG 64    CBG:  Recent Labs Lab 04/23/16 1850  GLUCAP 114*    Microbiology: Results for orders placed or performed during the hospital encounter of 03/10/16  Urine culture     Status: None   Collection Time: 03/10/16  4:35 AM  Result Value Ref Range Status   Specimen Description URINE, RANDOM  Final   Special Requests NONE  Final   Culture NO GROWTH  Final   Report Status 03/11/2016 FINAL  Final  Culture, blood (Routine X 2) w Reflex to ID Panel     Status: None   Collection Time: 03/10/16  6:10 AM  Result Value Ref Range Status   Specimen Description BLOOD RIGHT THUMB  Final   Special Requests BOTTLES DRAWN AEROBIC AND ANAEROBIC 3CC  Final   Culture NO GROWTH 5 DAYS  Final   Report Status 03/15/2016 FINAL  Final  Culture, blood (Routine X  2) w Reflex to ID Panel     Status: None   Collection Time: 03/10/16  6:40 AM  Result Value Ref Range Status   Specimen Description BLOOD RIGHT HAND  Final   Special Requests IN PEDIATRIC BOTTLE 2CC  Final   Culture NO GROWTH 5 DAYS  Final   Report Status 03/15/2016 FINAL  Final  MRSA PCR Screening     Status: None   Collection Time: 03/10/16  7:55 PM  Result Value Ref Range Status   MRSA by PCR NEGATIVE NEGATIVE Final    Comment:        The GeneXpert MRSA Assay (FDA approved for NASAL specimens only), is one component of a comprehensive MRSA colonization surveillance program. It is not intended to diagnose MRSA infection nor to guide or monitor treatment for MRSA infections.     Coagulation Studies: No results for input(s): LABPROT, INR in the last 72 hours.  Imaging: Ct Head Wo Contrast  Result Date: 04/23/2016 CLINICAL DATA:  Confusion and history of  hypertension. EXAM: CT HEAD WITHOUT CONTRAST TECHNIQUE: Contiguous axial images were obtained from the base of the skull through the vertex without intravenous contrast. COMPARISON:  06/10/2015 FINDINGS: Brain: No mass lesion, hemorrhage, hydrocephalus, acute infarct, intra-axial, or extra-axial fluid collection. Vascular: No hyperdense vessel or unexpected calcification. Skull: Normal Sinuses/Orbits: Endotracheal and nasogastric tubes are incompletely imaged. Fluid in the oropharynx likely secondary. Normal orbits and globes. Minimal fluid in the sphenoid sinus. Clear mastoid air cells. Other: None IMPRESSION: 1.  No acute intracranial abnormality. 2. Sinus disease. Electronically Signed   By: Abigail Miyamoto M.D.   On: 04/23/2016 22:51   Dg Chest Port 1 View  Result Date: 04/23/2016 CLINICAL DATA:  Seizure with unresponsiveness EXAM: PORTABLE CHEST 1 VIEW COMPARISON:  08/03/2015 CXR FINDINGS: AP portable semi upright view of the chest was provided. The patient is slightly rotated. There is stable cardiomegaly with uncoiling of the thoracic aorta. Endotracheal tube tip is satisfactory at 5.1 cm above the carina. A gastric tube is seen extending towards the diaphragm. The tip is excluded. Patchy airspace opacities are seen in the right upper lobe suspicious for aspiration. Minimal bibasilar opacities may reflect areas of atelectasis and/or infiltrate as well. Osteoarthritis of the visualized glenohumeral joints. Skin fold artifact projects over the right lateral humeral head. IMPRESSION: 1. Cardiomegaly with bilateral airspace opacities predominantly in the right upper lobe with lesser degree of atelectasis and/or minimal infiltrate in both lung bases. Findings may reflect aspiration pneumonia. 2. Satisfactory endotracheal tube tip position approximately 5.1 cm above the carina. A gastric tube is incompletely visualized but appears to extend below the diaphragm. Electronically Signed   By: Ashley Royalty M.D.   On:  04/23/2016 20:07    Assessment: 66 year old female with new onset seizures. 1. Possible sepsis. Seizures can be triggered by infection.  2. Hypomagnesemia. May also be the etiology for her new onset seizures.  3. CT head without acute abnormality.  4. Findings suggestive of aspiration pneumonia on CXR.   Recommendations: 1. Keppra 1500 mg IV load, then scheduled at 750 mg BID.  2. EEG has been ordered.  3. Correct magnesium level and continue to monitor (4 g IV magnesium sulfate has been ordered by MICU attending).  4. Treatment for presumed sepsis.  5. Wean off sedation as tolerated.  6. Seizure precautions.   Electronically signed: Dr. Kerney Elbe 04/23/2016, 10:58 PM

## 2016-04-23 NOTE — ED Triage Notes (Signed)
Pt arrives by [facility] from home was found unresponsive having seizure like activity. Pt arrives to ED unresponsive with a 7.5 ET tube in place. Pt is saturated in stool and urine. Pt was given 10mg  versed en route for seizure like activity.

## 2016-04-23 NOTE — ED Notes (Signed)
Unsuccessful blood draw, reject x1 QNS

## 2016-04-23 NOTE — ED Provider Notes (Signed)
[facility] DEPT Provider Note   CSN: [id number] Arrival date & time: 04/23/16  1847     History   Chief Complaint Chief Complaint  Patient presents with  . Altered Mental Status    HPI Jillian Eaton is a 66 y.o. female.  HPI Pt arrives by [facility] from home was found unresponsive having seizure like activity. Pt arrives to ED unresponsive with a 7.5 ET tube in place. Pt is saturated in stool and urine. Pt was given 10mg  versed en route for seizure like activity.   Per EMS, when they arrived at the house was in extremely poor condition Patient was found with her family member at her side, he was unaware of the gravity of the patient's condition and was a poor historian and could not relate when patient was last normal It was in fact actually the son of the patient, who was in jail at the time but called EMS - ?presumably due to inability to contact pt? Patient was apparently actively seizing and obtunded with bystander unaware of pts grave condition while pt saturated in stool and urine per EMS She was given 5 mg of Versed with resolution of her seizures Her point-of-care glucose was within normal limits She subsequently got bagged but was difficult to bag and was intubated Received an additional 5 mg of Versed for sedation She was hemodynamically stable in route with a fever for 102 She did not receive any additional interventions  Upon arrival, patient is obtunded, unable to provide any history   Past Medical History:  Diagnosis Date  . Anemia   . Asthma   . Chronic headache   . DOE (dyspnea on exertion)    2D ECHO, 02/12/2012 - EF 60-65%, moderate concentric hypertrophy  . Fibromyalgia    nerve pain"left side at waist level" "can't lay on that side without pain" , "HOB elevation helps"  . Fibromyalgia   . Heart murmur   . Hematuria - cause not known   . History of kidney stones    x 2 '13, '14 surgery to remove  . Hypertension   . SOB (shortness of breath)    NUCLEAR STRESS TEST, 03/02/2009 - no ischemic ST changes or symptoms  . Thyroid disease    "goiter"  . Transfusion history    10 yrs+  . Urinary incontinence, functional     Patient Active Problem List   Diagnosis Date Noted  . Toxic metabolic encephalopathy [id number]  . Pressure injury of skin 03/11/2016  . Acute respiratory failure ([place]) 03/10/2016  . Altered mental status 03/10/2016  . Contusion of other part of head   . Opioid overdose   . Leg swelling 10/19/2015  . Acute on chronic respiratory failure with hypoxemia ([place]) 08/03/2015  . Bilateral leg edema 05/11/2015  . Essential hypertension 05/11/2015  . UTI (lower urinary tract infection) 06/07/2013  . SBO (small bowel obstruction) 06/07/2013  . DOE (dyspnea on exertion) 01/08/2013  . Pulmonary hypertension ([place]) 01/08/2013  . Anemia, unspecified 03/28/2011  . Asthma 09/22/2010  . Super Morbid Obesity (BMI >60) 09/22/2010  . LOW BACK PAIN, ACUTE 04/11/2008  . TROCHANTERIC BURSITIS, LEFT 04/11/2008    Past Surgical History:  Procedure Laterality Date  . CARDIAC CATHETERIZATION  04/04/2010   No significant obstructive coronary artery disease  . CHOLECYSTECTOMY  1990  . COLONOSCOPY W/ POLYPECTOMY    . COLONOSCOPY WITH PROPOFOL N/A 04/10/2014   Procedure: COLONOSCOPY WITH PROPOFOL;  Surgeon: Beryle Beams, MD;  Location: [facility] ENDOSCOPY;  Service: Endoscopy;  Laterality: N/A;  . DIAGNOSTIC LAPAROSCOPY     x2 bowel obstructions(adhesions)  . 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 THYROIDECTOMY    . TUBAL LIGATION  1986    OB History    No data available       Home Medications    Prior to Admission medications   Medication Sig Start Date End Date Taking? Authorizing Provider  acetaminophen (TYLENOL) 500 MG tablet Take 500 mg by mouth every 6 (six) hours as needed for mild pain.    Historical Provider, MD  acetaminophen (TYLENOL) 650 MG CR tablet Take 650 mg by mouth every  6 (six) hours.    Historical Provider, MD  albuterol (PROAIR HFA) 108 (90 BASE) MCG/ACT inhaler Inhale 2 puffs into the lungs every 6 (six) hours as needed for wheezing or shortness of breath.     Historical Provider, MD  amLODipine (NORVASC) 5 MG tablet Take 5 mg by mouth daily. 05/07/15   Historical Provider, MD  amLODipine (NORVASC) 5 MG tablet Take 5 mg by mouth daily.    Historical Provider, MD  budesonide-formoterol (SYMBICORT) 160-4.5 MCG/ACT inhaler Inhale 2 puffs into the lungs 2 (two) times daily as needed (shortness of breath.).     Tanda Rockers, MD  calcium carbonate (CALCIUM 600) 600 MG TABS tablet Take 600 mg by mouth 2 (two) times daily with a meal.    Historical Provider, MD  calcium carbonate (OS-CAL) 600 MG TABS Take 600 mg by mouth 2 (two) times daily. Lunch and dinner    Historical Provider, MD  celecoxib (CELEBREX) 200 MG capsule Take 200 mg by mouth every morning.     Historical Provider, MD  celecoxib (CELEBREX) 200 MG capsule Take 200 mg by mouth daily.     Historical Provider, MD  cyanocobalamin (,VITAMIN B-12,) 1000 MCG/ML injection Inject 1,000 mcg into the skin every 30 (thirty) days.  02/12/12   Historical Provider, MD  cyclobenzaprine (FLEXERIL) 10 MG tablet Take 1 tablet by mouth 3 (three) times daily.  12/25/12   Historical Provider, MD  cyclobenzaprine (FLEXERIL) 10 MG tablet Take 1 tablet (10 mg total) by mouth 3 (three) times daily as needed for muscle spasms. 03/12/16   Lavina Hamman, MD  DULoxetine (CYMBALTA) 60 MG capsule Take 60 mg by mouth every evening.     Historical Provider, MD  DULoxetine (CYMBALTA) 60 MG capsule Take 60 mg by mouth daily.    Historical Provider, MD  Eszopiclone (ESZOPICLONE) 3 MG TABS Take 3 mg by mouth at bedtime. Take immediately before bedtime    Historical Provider, MD  Eszopiclone (ESZOPICLONE) 3 MG TABS Take 1 tablet (3 mg total) by mouth at bedtime as needed. Take immediately before bedtime 03/12/16   Lavina Hamman, MD  furosemide  (LASIX) 20 MG tablet Take 1 tablet (20 mg total) by mouth daily. 03/13/16   Lavina Hamman, MD  levothyroxine (SYNTHROID, LEVOTHROID) 150 MCG tablet Take 2 tablets (300 mcg total) by mouth daily before breakfast. 03/12/16   Lavina Hamman, MD  levothyroxine (SYNTHROID, LEVOTHROID) 300 MCG tablet Take 300 mcg by mouth daily before breakfast.    Historical Provider, MD  losartan (COZAAR) 100 MG tablet Take 100 mg by mouth every morning.     Historical Provider, MD  losartan (COZAAR) 100 MG tablet Take 100 mg by mouth daily.    Historical Provider, MD  mirabegron ER (MYRBETRIQ) 50 MG TB24  tablet Take 50 mg by mouth daily.    Historical Provider, MD  mirabegron ER (MYRBETRIQ) 50 MG TB24 Take 50 mg by mouth every morning.     Historical Provider, MD  oxyCODONE (OXY IR/ROXICODONE) 5 MG immediate release tablet Take 1 tablet (5 mg total) by mouth every 6 (six) hours as needed for moderate pain. 03/12/16   Lavina Hamman, MD  oxyCODONE-acetaminophen (PERCOCET) 10-325 MG tablet Take 1 tablet by mouth every 4 (four) hours as needed for pain.    Historical Provider, MD  pantoprazole (PROTONIX) 40 MG tablet Take 1 tablet (40 mg total) by mouth at bedtime. 03/12/16   Lavina Hamman, MD  pregabalin (LYRICA) 200 MG capsule Take 200 mg by mouth 3 (three) times daily.      Historical Provider, MD  pregabalin (LYRICA) 200 MG capsule Take 200 mg by mouth 3 (three) times daily.    Historical Provider, MD  Vitamin D, Ergocalciferol, (DRISDOL) 50000 units CAPS capsule Take 50,000 Units by mouth every 7 (seven) days.    Historical Provider, MD  Vitamin D, Ergocalciferol, (DRISDOL) 50000 UNITS CAPS Take 50,000 Units by mouth 2 (two) times a week. Tuesday and Thursday    Historical Provider, MD    Family History Family History  Problem Relation Age of Onset  . Diabetes Mother   . Epilepsy Mother   . Cancer Mother     Breast  . Hypertension Mother   . Breast cancer Mother   . Kidney disease Father   . Diabetes Father     . Hypertension Father   . Asthma Father   . Heart disease Father   . Epilepsy Sister   . Cancer Maternal Grandmother   . Breast cancer Maternal Grandmother   . Cancer Paternal Grandmother   . Breast cancer Paternal Grandmother     Social History Social History  Substance Use Topics  . Smoking status: Never Smoker  . Smokeless tobacco: Never Used  . Alcohol use No     Comment: wine occ     Allergies   Penicillins   Review of Systems Review of Systems  Unable to perform ROS: Mental status change     Physical Exam Updated Vital Signs BP 101/87   Pulse 114   Temp 102.9 F (39.4 C) (Axillary)   Resp (!) 30   Ht 5\' 8"  (1.727 m)   Wt 110.7 kg   SpO2 100%   BMI 37.11 kg/m   Physical Exam  Constitutional: She appears well-developed. She appears distressed.  Morbid obesity Extremely poorly kept, saturated in urine and stool with drenched pad full of stool and urine  HENT:  Head: Normocephalic and atraumatic.  Eyes: Conjunctivae are normal. Pupils are equal, round, and reactive to light.  Neck: Neck supple.  Cardiovascular: Regular rhythm.   No murmur heard. tacchy  Pulmonary/Chest: She has rales.  Intubated at 26 (per EMS), pulled to 23  Abdominal: Soft.  Musculoskeletal: She exhibits no edema.  No wound in sacrum  Neurological: She is alert.  Positive right babinski, left indeterminate  Skin: Skin is warm and dry.  Nursing note and vitals reviewed.  ED Treatments / Results  Labs (all labs ordered are listed, but only abnormal results are displayed) Labs Reviewed  COMPREHENSIVE METABOLIC PANEL - Abnormal; Notable for the following:       Result Value   Potassium 3.2 (*)    Chloride 98 (*)    Glucose, Bld 126 (*)    Creatinine, Ser 1.13 (*)  Calcium 8.2 (*)    GFR calc non Af Amer 51 (*)    GFR calc Af Amer 59 (*)    All other components within normal limits  CBC WITH DIFFERENTIAL/PLATELET - Abnormal; Notable for the following:    WBC 14.6 (*)     Neutro Abs 12.5 (*)    Lymphs Abs 0.6 (*)    Monocytes Absolute 1.5 (*)    All other components within normal limits  MAGNESIUM - Abnormal; Notable for the following:    Magnesium 1.2 (*)    All other components within normal limits  CBG MONITORING, ED - Abnormal; Notable for the following:    Glucose-Capillary 114 (*)    All other components within normal limits  I-STAT TROPOININ, ED - Abnormal; Notable for the following:    Troponin i, poc 0.87 (*)    All other components within normal limits  I-STAT ARTERIAL BLOOD GAS, ED - Abnormal; Notable for the following:    pO2, Arterial 266.0 (*)    Bicarbonate 28.9 (*)    Acid-Base Excess 3.0 (*)    All other components within normal limits  CULTURE, BLOOD (ROUTINE X 2)  CULTURE, BLOOD (ROUTINE X 2)  URINE CULTURE  C DIFFICILE QUICK SCREEN W PCR REFLEX  CK  TRIGLYCERIDES  URINALYSIS, ROUTINE W REFLEX MICROSCOPIC  RAPID URINE DRUG SCREEN, HOSP PERFORMED  HIV ANTIBODY (ROUTINE TESTING)  TROPONIN I  TROPONIN I  CBC  BASIC METABOLIC PANEL  PHOSPHORUS  I-STAT CG4 LACTIC ACID, ED  I-STAT CG4 LACTIC ACID, ED  I-STAT TROPOININ, ED    EKG  EKG Interpretation  Date/Time:  Sunday April 23 2016 18:51:08 EST Ventricular Rate:  111 PR Interval:    QRS Duration: 86 QT Interval:  343 QTC Calculation: 467 R Axis:   68 Text Interpretation:  Sinus tachycardia Multiform ventricular premature complexes Consider right atrial enlargement Minimal ST depression Borderline ST elevation, lateral leads Baseline wander in lead(s) V6 Confirmed by Maryan Rued  MD, Loree Fee ([id number]) on 04/23/2016 8:27:15 PM       Radiology Ct Head Wo Contrast  Result Date: 04/23/2016 CLINICAL DATA:  Confusion and history of hypertension. EXAM: CT HEAD WITHOUT CONTRAST TECHNIQUE: Contiguous axial images were obtained from the base of the skull through the vertex without intravenous contrast. COMPARISON:  06/10/2015 FINDINGS: Brain: No mass lesion, hemorrhage,  hydrocephalus, acute infarct, intra-axial, or extra-axial fluid collection. Vascular: No hyperdense vessel or unexpected calcification. Skull: Normal Sinuses/Orbits: Endotracheal and nasogastric tubes are incompletely imaged. Fluid in the oropharynx likely secondary. Normal orbits and globes. Minimal fluid in the sphenoid sinus. Clear mastoid air cells. Other: None IMPRESSION: 1.  No acute intracranial abnormality. 2. Sinus disease. Electronically Signed   By: Abigail Miyamoto M.D.   On: 04/23/2016 22:51   Dg Chest Port 1 View  Result Date: 04/23/2016 CLINICAL DATA:  Seizure with unresponsiveness EXAM: PORTABLE CHEST 1 VIEW COMPARISON:  08/03/2015 CXR FINDINGS: AP portable semi upright view of the chest was provided. The patient is slightly rotated. There is stable cardiomegaly with uncoiling of the thoracic aorta. Endotracheal tube tip is satisfactory at 5.1 cm above the carina. A gastric tube is seen extending towards the diaphragm. The tip is excluded. Patchy airspace opacities are seen in the right upper lobe suspicious for aspiration. Minimal bibasilar opacities may reflect areas of atelectasis and/or infiltrate as well. Osteoarthritis of the visualized glenohumeral joints. Skin fold artifact projects over the right lateral humeral head. IMPRESSION: 1. Cardiomegaly with bilateral airspace opacities predominantly in the  right upper lobe with lesser degree of atelectasis and/or minimal infiltrate in both lung bases. Findings may reflect aspiration pneumonia. 2. Satisfactory endotracheal tube tip position approximately 5.1 cm above the carina. A gastric tube is incompletely visualized but appears to extend below the diaphragm. Electronically Signed   By: Ashley Royalty M.D.   On: 04/23/2016 20:07    Procedures Procedures (including critical care time)  Medications Ordered in ED Medications  midazolam (VERSED) 2 MG/2ML injection (not administered)  vancomycin (VANCOCIN) 1,250 mg in sodium chloride 0.9 % 250 mL  IVPB (not administered)  aztreonam (AZACTAM) 2 g in dextrose 5 % 50 mL IVPB (not administered)  levofloxacin (LEVAQUIN) IVPB 750 mg (not administered)  0.9 %  sodium chloride infusion (not administered)  famotidine (PEPCID) 40 MG/5ML suspension 20 mg (not administered)  fentaNYL (SUBLIMAZE) injection 100 mcg (not administered)  fentaNYL (SUBLIMAZE) injection 100 mcg (not administered)  propofol (DIPRIVAN) 1000 MG/100ML infusion (5 mcg/kg/min  110.7 kg Intravenous Transfusing/Transfer 04/23/16 2308)  magnesium sulfate IVPB 4 g 100 mL (not administered)  levETIRAcetam (KEPPRA) 1,500 mg in sodium chloride 0.9 % 100 mL IVPB (not administered)  levETIRAcetam (KEPPRA) 750 mg in sodium chloride 0.9 % 100 mL IVPB (not administered)  levofloxacin (LEVAQUIN) IVPB 750 mg (0 mg Intravenous Stopped 04/23/16 2302)  aztreonam (AZACTAM) 2 g in dextrose 5 % 50 mL IVPB (0 g Intravenous Stopped 04/23/16 2144)  vancomycin (VANCOCIN) IVPB 1000 mg/200 mL premix (0 mg Intravenous Stopped 04/23/16 2117)  acetaminophen (TYLENOL) suppository 650 mg (650 mg Rectal Given 04/23/16 1900)  sodium chloride 0.9 % bolus 1,000 mL (0 mLs Intravenous Stopped 04/23/16 2144)  midazolam (VERSED) 2 MG/2ML injection (2 mg  Given 04/23/16 1914)  vancomycin (VANCOCIN) IVPB 1000 mg/200 mL premix (1,000 mg Intravenous Transfusing/Transfer 04/23/16 2308)  sodium chloride 0.9 % 1,000 mL with potassium chloride 80 mEq infusion ( Intravenous Given 04/23/16 2303)     Initial Impression / Assessment and Plan / ED Course  I have reviewed the triage vital signs and the nursing notes.  Pertinent labs & imaging results that were available during my care of the patient were reviewed by me and considered in my medical decision making (see chart for details).     Attempted to call [phone] (H) for information regarding and to update, no answer.  Patient is clearly septic And likely aspirated with secondary hypoxia and possible seizure secondary to  this Unclear etiology but the patient is no longer seizing CT head without acute intracranial abnormality; however, patient does have a positive Babinski on the right She does have a history of narcotic overdose and possibly overdosed initially EKG without evidence of acute ischemia although does have some demand ischemia ST changes with positive troponin that we will closely follow  given Tylenol rectally; abx ordered, fluids started Labs considering medical decision making Will admit to ICU  Final Clinical Impressions(s) / ED Diagnoses   Final diagnoses:  Altered mental status, unspecified altered mental status type  Sepsis, due to unspecified organism [facility])    New Prescriptions New Prescriptions   No medications on file     Karma Greaser, MD 04/23/16 [place], MD 04/25/16 2026

## 2016-04-23 NOTE — H&P (Signed)
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 No immediate history is known. She was recently admitted in January 2018 for an unintentional narcotic overdose and hypoxemic/hypercapneic respiratory failure. She was discharged 1/21.  On arrival to the ED she had already been intubated by EMS in the field. It is unknown what, if any, medications she required. She did receive Versed en route for possible seizure activity. She had a large volume bowel movement and urinary incontinence - it is unclear if these occurred contemporaneously with the seizure activity. No known history of seizures. She was febrile to 103 and CBC showed a mild leukocytosis w/ L shift. She was started on vanc/aztreonam/levaquin (PCN allergy). No family is available to provide additional history.  While in the ED she was reportedly dyssynchronous with the ventilator with respiratory rate in the 50s. She was started on fentanyl and versed infusions. She has been seen to move both upper extremities spontaneously.   PAST MEDICAL HISTORY :  She  has a past medical history of Anemia; Asthma; Chronic headache; DOE (dyspnea on exertion); Fibromyalgia; Fibromyalgia; Heart murmur; Hematuria - cause not known; History of kidney stones; Hypertension; SOB (shortness of breath); Thyroid disease; Transfusion history; and Urinary incontinence, functional.  PAST SURGICAL HISTORY: She  has a past surgical history that includes Cholecystectomy (1990);  Gastroplasty (1985); Tubal ligation (1986); Cardiac catheterization (04/04/2010); Thyroidectomy; Diagnostic laparoscopy; Colonoscopy w/ polypectomy; and Colonoscopy with propofol (N/A, 04/10/2014).  Allergies  Allergen Reactions  . Penicillins Other (See Comments)    intolerance    No current facility-administered medications on file prior to encounter.    Current Outpatient Prescriptions on File Prior to Encounter  Medication Sig  . acetaminophen (TYLENOL) 500 MG tablet Take 500 mg by mouth every 6 (six) hours as needed for mild pain.  Marland Kitchen acetaminophen (TYLENOL) 650 MG CR tablet Take 650 mg by mouth every 6 (six) hours.  Marland Kitchen albuterol (PROAIR HFA) 108 (90 BASE) MCG/ACT inhaler Inhale 2 puffs into the lungs every 6 (six) hours as needed for wheezing or shortness of breath.   Marland Kitchen amLODipine (NORVASC) 5 MG tablet Take 5 mg by mouth daily.  Marland Kitchen amLODipine (NORVASC) 5 MG tablet Take 5 mg by mouth daily.  . budesonide-formoterol (SYMBICORT) 160-4.5 MCG/ACT inhaler Inhale 2 puffs into the lungs 2 (two) times daily as needed (shortness of breath.).   Marland Kitchen calcium carbonate (CALCIUM 600) 600 MG TABS tablet Take 600 mg by mouth 2 (two) times daily with a meal.  . calcium carbonate (OS-CAL) 600 MG TABS Take 600 mg by mouth 2 (two) times daily. Lunch and dinner  . celecoxib (CELEBREX) 200 MG capsule Take 200 mg by mouth every morning.   . celecoxib (CELEBREX) 200 MG capsule Take 200 mg by mouth daily.   . cyanocobalamin (,VITAMIN B-12,) 1000 MCG/ML injection Inject 1,000 mcg into the skin every 30 (thirty) days.   . cyclobenzaprine (FLEXERIL) 10 MG tablet Take 1 tablet by mouth 3 (three) times daily.   . cyclobenzaprine (FLEXERIL) 10 MG tablet Take 1  tablet (10 mg total) by mouth 3 (three) times daily as needed for muscle spasms.  . DULoxetine (CYMBALTA) 60 MG capsule Take 60 mg by mouth every evening.   . DULoxetine (CYMBALTA) 60 MG capsule Take 60 mg by mouth daily.  . Eszopiclone (ESZOPICLONE) 3 MG TABS Take  3 mg by mouth at bedtime. Take immediately before bedtime  . Eszopiclone (ESZOPICLONE) 3 MG TABS Take 1 tablet (3 mg total) by mouth at bedtime as needed. Take immediately before bedtime  . furosemide (LASIX) 20 MG tablet Take 1 tablet (20 mg total) by mouth daily.  Marland Kitchen levothyroxine (SYNTHROID, LEVOTHROID) 150 MCG tablet Take 2 tablets (300 mcg total) by mouth daily before breakfast.  . levothyroxine (SYNTHROID, LEVOTHROID) 300 MCG tablet Take 300 mcg by mouth daily before breakfast.  . losartan (COZAAR) 100 MG tablet Take 100 mg by mouth every morning.   Marland Kitchen losartan (COZAAR) 100 MG tablet Take 100 mg by mouth daily.  . mirabegron ER (MYRBETRIQ) 50 MG TB24 tablet Take 50 mg by mouth daily.  . mirabegron ER (MYRBETRIQ) 50 MG TB24 Take 50 mg by mouth every morning.   Marland Kitchen oxyCODONE (OXY IR/ROXICODONE) 5 MG immediate release tablet Take 1 tablet (5 mg total) by mouth every 6 (six) hours as needed for moderate pain.  Marland Kitchen oxyCODONE-acetaminophen (PERCOCET) 10-325 MG tablet Take 1 tablet by mouth every 4 (four) hours as needed for pain.  . pantoprazole (PROTONIX) 40 MG tablet Take 1 tablet (40 mg total) by mouth at bedtime.  . pregabalin (LYRICA) 200 MG capsule Take 200 mg by mouth 3 (three) times daily.    . pregabalin (LYRICA) 200 MG capsule Take 200 mg by mouth 3 (three) times daily.  . Vitamin D, Ergocalciferol, (DRISDOL) 50000 units CAPS capsule Take 50,000 Units by mouth every 7 (seven) days.  . Vitamin D, Ergocalciferol, (DRISDOL) 50000 UNITS CAPS Take 50,000 Units by mouth 2 (two) times a week. Tuesday and Thursday    FAMILY HISTORY:  Her indicated that her mother is deceased. She indicated that her father is deceased. She indicated that three of her four sisters are alive. She indicated that her maternal grandmother is deceased. She indicated that her maternal grandfather is deceased. She indicated that her paternal grandmother is deceased. She indicated that her paternal grandfather is deceased. She  indicated that all of her three childs are alive.    SOCIAL HISTORY: She  reports that she has never smoked. She has never used smokeless tobacco. She reports that she uses drugs, including Oxycodone. She reports that she does not drink alcohol.  REVIEW OF SYSTEMS:   Unable to obtain  SUBJECTIVE:    VITAL SIGNS: BP 141/95   Pulse 89   Temp (!) 103.9 F (39.9 C) (Rectal)   Resp (!) 29   Ht 5\' 8"  (1.727 m)   Wt 110.7 kg (244 lb 0.8 oz)   SpO2 100%   BMI 37.11 kg/m   HEMODYNAMICS:    VENTILATOR SETTINGS: Vent Mode: PRVC FiO2 (%):  [100 %] 100 % Set Rate:  [15 bmp-18 bmp] 18 bmp Vt Set:  [500 mL-510 mL] 510 mL PEEP:  [5 cmH20] 5 cmH20  INTAKE / OUTPUT: No intake/output data recorded.  PHYSICAL EXAMINATION: Physical Exam: Temp:  [103.9 F (39.9 C)] 103.9 F (39.9 C) (03/04 1855) Pulse Rate:  [89] 89 (03/04 1900) Resp:  [26-35] 29 (03/04 2010) BP: (141-164)/(95-133) 141/95 (03/04 2010) SpO2:  [100 %] 100 % (03/04 1935) FiO2 (%):  [100 %] 100 % (  03/04 1935) Weight:  [110.7 kg (244 lb 0.8 oz)] 110.7 kg (244 lb 0.8 oz) (03/04 1900)  General Well nourished, well developed, obese, intubated, sedated  HEENT No gross abnormalities. OETT, OGT in place.   Pulmonary Coarse bilaterally with prolonged expiratory phase. No wheezes, rales or ronchi. Vent-assisted effort, symmetrical expansion.   Cardiovascular Tachy 120s, regular rhythm. S1, s2. II/VI harsh SEM. No r/g. Distal pulses palpable.  Abdomen Obese, soft, non-tender, non-distended, positive bowel sounds, no palpable organomegaly or masses. Normoresonant to percussion.  Musculoskeletal Grossly normal  Lymphatics No cervical, supraclavicular or axillary adenopathy.   Neurologic PERRL. Squints eyes against attempted opening. Localizes to noxious stimuli.   Skin/Integuement Dry. No rash, no cyanosis, no clubbing. Trace bipedal edema.      LABS:  BMET  Recent Labs Lab 04/23/16 2001  NA 137  K 3.2*  CL 98*  CO2  24  BUN 11  CREATININE 1.13*  GLUCOSE 126*    Electrolytes  Recent Labs Lab 04/23/16 2001  CALCIUM 8.2*    CBC  Recent Labs Lab 04/23/16 2001  WBC 14.6*  HGB 14.3  HCT 43.4  PLT 160    Coag's No results for input(s): APTT, INR in the last 168 hours.  Sepsis Markers  Recent Labs Lab 04/23/16 2015  LATICACIDVEN 1.51    ABG  Recent Labs Lab 04/23/16 2038  PHART 7.405  PCO2ART 46.2  PO2ART 266.0*    Liver Enzymes  Recent Labs Lab 04/23/16 2001  AST 34  ALT 14  ALKPHOS 90  BILITOT 0.9  ALBUMIN 3.8    Cardiac Enzymes No results for input(s): TROPONINI, PROBNP in the last 168 hours.  Glucose  Recent Labs Lab 04/23/16 1850  GLUCAP 114*    Imaging Dg Chest Port 1 View  Result Date: 04/23/2016 CLINICAL DATA:  Seizure with unresponsiveness EXAM: PORTABLE CHEST 1 VIEW COMPARISON:  08/03/2015 CXR FINDINGS: AP portable semi upright view of the chest was provided. The patient is slightly rotated. There is stable cardiomegaly with uncoiling of the thoracic aorta. Endotracheal tube tip is satisfactory at 5.1 cm above the carina. A gastric tube is seen extending towards the diaphragm. The tip is excluded. Patchy airspace opacities are seen in the right upper lobe suspicious for aspiration. Minimal bibasilar opacities may reflect areas of atelectasis and/or infiltrate as well. Osteoarthritis of the visualized glenohumeral joints. Skin fold artifact projects over the right lateral humeral head. IMPRESSION: 1. Cardiomegaly with bilateral airspace opacities predominantly in the right upper lobe with lesser degree of atelectasis and/or minimal infiltrate in both lung bases. Findings may reflect aspiration pneumonia. 2. Satisfactory endotracheal tube tip position approximately 5.1 cm above the carina. A gastric tube is incompletely visualized but appears to extend below the diaphragm. Electronically Signed   By: Ashley Royalty M.D.   On: 04/23/2016 20:07     STUDIES:     CULTURES: 3/4 Blood x 2 3/4 Urine   ANTIBIOTICS: Vanc 3/4 >> Levaquin 3/4 >> Aztreonam 3/4 >>  SIGNIFICANT EVENTS:   LINES/TUBES: OETT 3/4 >> OGT PIV  DISCUSSION: Ms. Wolanski is a 56F with multiple medical problems presenting with obtundation of unclear etiology. Reportedly there was concern for seizure activity in the ambulance, but no further episodes in the ED. She has a history of unintentional narcotic overdose. She was febrile and has a mild leukocytosis, raising concern for sepsis. Her troponin is mildly elevated at 0.8, but no significant ECG changes were noted.   ASSESSMENT / PLAN:  PULMONARY A: Need  for mechanical ventilation Hx restrictive (obesity-related) lung disease P:   Continue ventilatory support Wean as tolerated SBT when able  CARDIOVASCULAR A:  Troponemia Tachycardia Hx HFpEF HTN P:  Trend troponin Monitor response to fluids Resume home meds as able  RENAL A:   Hypokalemia P:   Replace K Trend BMP  GASTROINTESTINAL A:   Fecal incontinence P:   C diff pending NPO pepcid for gi ppx  HEMATOLOGIC A:   Mild leukocytosis Chronic thrombocytopenia - stable P:  Trend CBC  INFECTIOUS A:   Concern for sepsis - source unclear P:   Agree with broad spectrum Abx for now Follow cultures Check Flu Check UA/culture Await C diff result  ENDOCRINE A:   Hypothyroidism   P:   Resume home synthroid  NEUROLOGIC A:   Toxic-metabolic encephalopathy Possible seizure P:   RASS goal: 0 Wean sedation - d/c versed, intermittent fentanyl Await CT head Await UDS EEG Check mag May need neuro c/s  FAMILY  - Updates: unable to reach family by phone. Per prior admissions, patient desires to be FULL CODE.  - Inter-disciplinary family meet or Palliative Care meeting due by:  day 7  The patient is critically ill with multiple organ system failure and requires high complexity decision making for assessment and support, frequent  evaluation and titration of therapies, advanced monitoring, review of radiographic studies and interpretation of complex data.   Critical Care Time devoted to patient care services, exclusive of separately billable procedures, described in this note is 48 minutes.   Yisroel Ramming, MD Pulmonary and [place] Pager: [phone]  04/23/2016, 9:42 PM

## 2016-04-23 NOTE — ED Notes (Signed)
Dr.Plunkett aware of unable to obtain labs. Phlebotomy has stuck x 3 and RN x 2

## 2016-04-24 ENCOUNTER — Inpatient Hospital Stay (HOSPITAL_COMMUNITY): Payer: Federal, State, Local not specified - PPO

## 2016-04-24 ENCOUNTER — Encounter (HOSPITAL_COMMUNITY): Payer: Self-pay | Admitting: *Deleted

## 2016-04-24 DIAGNOSIS — Z01810 Encounter for preprocedural cardiovascular examination: Secondary | ICD-10-CM

## 2016-04-24 DIAGNOSIS — R4182 Altered mental status, unspecified: Secondary | ICD-10-CM

## 2016-04-24 DIAGNOSIS — G92 Toxic encephalopathy: Secondary | ICD-10-CM

## 2016-04-24 DIAGNOSIS — I214 Non-ST elevation (NSTEMI) myocardial infarction: Secondary | ICD-10-CM

## 2016-04-24 DIAGNOSIS — Z452 Encounter for adjustment and management of vascular access device: Secondary | ICD-10-CM

## 2016-04-24 DIAGNOSIS — I219 Acute myocardial infarction, unspecified: Secondary | ICD-10-CM

## 2016-04-24 DIAGNOSIS — I5021 Acute systolic (congestive) heart failure: Secondary | ICD-10-CM

## 2016-04-24 LAB — URINALYSIS, ROUTINE W REFLEX MICROSCOPIC
Glucose, UA: 50 mg/dL — AB
Ketones, ur: 5 mg/dL — AB
Leukocytes, UA: NEGATIVE
NITRITE: NEGATIVE
Protein, ur: >=300 mg/dL — AB
RBC / HPF: NONE SEEN RBC/hpf (ref 0–5)
SPECIFIC GRAVITY, URINE: 1.03 (ref 1.005–1.030)
pH: 5 (ref 5.0–8.0)

## 2016-04-24 LAB — ECHOCARDIOGRAM COMPLETE
Height: 68 in
Weight: 5216 oz

## 2016-04-24 LAB — GLUCOSE, CAPILLARY
GLUCOSE-CAPILLARY: 102 mg/dL — AB (ref 65–99)
GLUCOSE-CAPILLARY: 86 mg/dL (ref 65–99)
Glucose-Capillary: 108 mg/dL — ABNORMAL HIGH (ref 65–99)
Glucose-Capillary: 122 mg/dL — ABNORMAL HIGH (ref 65–99)
Glucose-Capillary: 141 mg/dL — ABNORMAL HIGH (ref 65–99)
Glucose-Capillary: 89 mg/dL (ref 65–99)
Glucose-Capillary: 96 mg/dL (ref 65–99)

## 2016-04-24 LAB — MRSA PCR SCREENING: MRSA by PCR: NEGATIVE

## 2016-04-24 LAB — RAPID URINE DRUG SCREEN, HOSP PERFORMED
Amphetamines: NOT DETECTED
Barbiturates: NOT DETECTED
Benzodiazepines: POSITIVE — AB
COCAINE: NOT DETECTED
OPIATES: POSITIVE — AB
TETRAHYDROCANNABINOL: NOT DETECTED

## 2016-04-24 LAB — C DIFFICILE QUICK SCREEN W PCR REFLEX
C DIFFICLE (CDIFF) ANTIGEN: NEGATIVE
C Diff interpretation: NOT DETECTED
C Diff toxin: NEGATIVE

## 2016-04-24 LAB — INFLUENZA PANEL BY PCR (TYPE A & B)
Influenza A By PCR: NEGATIVE
Influenza B By PCR: NEGATIVE

## 2016-04-24 LAB — PHOSPHORUS: Phosphorus: 3.3 mg/dL (ref 2.5–4.6)

## 2016-04-24 LAB — TROPONIN I
TROPONIN I: 4.1 ng/mL — AB (ref <0.03)
Troponin I: 10.54 ng/mL (ref <0.03)

## 2016-04-24 LAB — HIV ANTIBODY (ROUTINE TESTING W REFLEX): HIV Screen 4th Generation wRfx: NONREACTIVE

## 2016-04-24 LAB — MAGNESIUM: Magnesium: 2 mg/dL (ref 1.7–2.4)

## 2016-04-24 MED ORDER — SODIUM CHLORIDE 0.9 % IV SOLN
250.0000 mL | INTRAVENOUS | Status: DC
  Administered 2016-04-24: 250 mL via INTRAVENOUS

## 2016-04-24 MED ORDER — VITAL HIGH PROTEIN PO LIQD
1000.0000 mL | ORAL | Status: DC
  Administered 2016-04-24: 1000 mL

## 2016-04-24 MED ORDER — HEPARIN (PORCINE) IN NACL 100-0.45 UNIT/ML-% IJ SOLN
1750.0000 [IU]/h | INTRAMUSCULAR | Status: DC
  Administered 2016-04-24 (×2): 1000 [IU]/h via INTRAVENOUS
  Administered 2016-04-25 – 2016-04-27 (×4): 1750 [IU]/h via INTRAVENOUS
  Filled 2016-04-24 (×10): qty 250

## 2016-04-24 MED ORDER — PRO-STAT SUGAR FREE PO LIQD
30.0000 mL | Freq: Two times a day (BID) | ORAL | Status: DC
  Administered 2016-04-24: 30 mL
  Filled 2016-04-24 (×2): qty 30

## 2016-04-24 MED ORDER — CHLORHEXIDINE GLUCONATE 0.12% ORAL RINSE (MEDLINE KIT)
15.0000 mL | Freq: Two times a day (BID) | OROMUCOSAL | Status: DC
  Administered 2016-04-24 – 2016-04-26 (×5): 15 mL via OROMUCOSAL

## 2016-04-24 MED ORDER — DEXTROSE 5 % IV SOLN
1.0000 g | Freq: Three times a day (TID) | INTRAVENOUS | Status: DC
  Administered 2016-04-24 – 2016-04-26 (×6): 1 g via INTRAVENOUS
  Filled 2016-04-24 (×7): qty 1

## 2016-04-24 MED ORDER — ASPIRIN EC 81 MG PO TBEC
81.0000 mg | DELAYED_RELEASE_TABLET | Freq: Every day | ORAL | Status: DC
  Administered 2016-04-24 – 2016-05-05 (×13): 81 mg via ORAL
  Filled 2016-04-24 (×13): qty 1

## 2016-04-24 MED ORDER — ALBUTEROL SULFATE (2.5 MG/3ML) 0.083% IN NEBU
2.5000 mg | INHALATION_SOLUTION | RESPIRATORY_TRACT | Status: DC | PRN
  Administered 2016-04-26 – 2016-04-28 (×3): 2.5 mg via RESPIRATORY_TRACT
  Filled 2016-04-24 (×3): qty 3

## 2016-04-24 MED ORDER — HEPARIN BOLUS VIA INFUSION
4000.0000 [IU] | Freq: Once | INTRAVENOUS | Status: AC
  Administered 2016-04-24: 4000 [IU] via INTRAVENOUS
  Filled 2016-04-24: qty 4000

## 2016-04-24 MED ORDER — ACETAMINOPHEN 160 MG/5ML PO SOLN
650.0000 mg | ORAL | Status: AC
  Administered 2016-04-24: 650 mg
  Filled 2016-04-24: qty 20.3

## 2016-04-24 MED ORDER — ORAL CARE MOUTH RINSE
15.0000 mL | OROMUCOSAL | Status: DC
  Administered 2016-04-24 – 2016-04-26 (×20): 15 mL via OROMUCOSAL

## 2016-04-24 MED ORDER — ACETAMINOPHEN 160 MG/5ML PO SOLN: 650.0000 mg | Freq: Four times a day (QID) | ORAL | Status: DC | PRN

## 2016-04-24 MED ORDER — IBUPROFEN 100 MG/5ML PO SUSP
600.0000 mg | ORAL | Status: AC
  Administered 2016-04-24: 600 mg
  Filled 2016-04-24: qty 30

## 2016-04-24 MED ORDER — VITAL HIGH PROTEIN PO LIQD: 1000.0000 mL | ORAL | Status: DC

## 2016-04-24 MED ORDER — SODIUM CHLORIDE 0.9 % IV BOLUS (SEPSIS)
1000.0000 mL | Freq: Once | INTRAVENOUS | Status: AC
  Administered 2016-04-24: 1000 mL via INTRAVENOUS

## 2016-04-24 MED ORDER — VITAL HIGH PROTEIN PO LIQD
1000.0000 mL | ORAL | Status: DC
  Administered 2016-04-24 – 2016-04-26 (×4): 1000 mL
  Filled 2016-04-24 (×3): qty 1000

## 2016-04-24 NOTE — Procedures (Signed)
ELECTROENCEPHALOGRAM REPORT  Date of Study: 04/24/2016  Patient's Name: Jillian Eaton MRN: [id number] Date of Birth: 1950/08/01  Referring Provider: Dr. Germain Osgood  Clinical History: This is a 66 year old woman found unresponsive with seizure activity.  Medications: levETIRAcetam (KEPPRA) 750 mg in sodium chloride 0.9 % 100 mL IVPB  acetaminophen (TYLENOL) solution 650 mg  aztreonam (AZACTAM) 2 g in dextrose 5 % 50 mL IVPB  chlorhexidine gluconate (MEDLINE KIT) (PERIDEX) 0.12 % solution 15 mL  famotidine (PEPCID) 40 MG/5ML suspension 20 mg  levofloxacin (LEVAQUIN) IVPB 750 mg  vancomycin (VANCOCIN) 1,250 mg in sodium chloride 0.9 % 250 mL IVPB   Technical Summary: A multichannel digital EEG recording measured by the international 10-20 system with electrodes applied with paste and impedances below 5000 ohms performed as portable with EKG monitoring in an intubated and unresponsive patient.  Hyperventilation and photic stimulation were not performed.  The digital EEG was referentially recorded, reformatted, and digitally filtered in a variety of bipolar and referential montages for optimal display.   Description: The patient is intubated and unresponsive during the recording. Propofol turned off before EEG. There is no clear posterior dominant rhythm. The background consists of a large amount of diffuse 4-5 Hz theta and 2-3 Hz delta slowing admixed with diffuse alpha and beta activity. During drowsiness and sleep, there is an increase in theta and delta slowing of the background with poorly formed vertex waves seen.  Hyperventilation and photic stimulation were not performed. There were no epileptiform discharges seen. At 04:37 minutes into the recording, there is a 12-second run of rhythmic low voltage sharp waves localized over the P3 electrode that appears to evolve in frequency. It is unclear if this represents a focal seizure versus artifact due to P3 electrode artifact seen  during other parts of the study. No clinical changes were seen.  EKG lead showed irregular rhythm.  Impression: This EEG is abnormal due to the presence of: 1. Moderate diffuse slowing of the background 2. A 12-second run of rhythmic low voltage sharp waves localized to P3 (left parietal region) as noted above  Clinical Correlation of the above findings indicates diffuse cerebral dysfunction that is non-specific in etiology and can be seen with hypoxic/ischemic injury, toxic/metabolic encephalopathies, or sedating medication effect. There is a 12-second run of rhythmic sharp activity localized over the P3 electrode, unclear if this represents a focal electrographic seizure versus P3 electrode artifact that is seen at different times in the recording. There were no other epileptiform discharges seen. A repeat EEG again off sedation may be helpful. Clinical correlation is advised.   Ellouise Newer, M.D.

## 2016-04-24 NOTE — Progress Notes (Signed)
PCCM Progress Note  Admission date: 04/23/2016 Referring provider: Dr. Jarold Song  CC: Seizure  HPI: 66 yo female brought to ER with seizure and altered mental status.  Intubated for airway protection.  She has PMHx of chronic pain, fibromyalgia, hypothyroidism, diastolic CHF, asthma.  She was febrile on admission with CXR concerning for aspiration.  Subjective: Fluctuating BP.  Getting EEG.  Vital signs: BP (!) 63/19   Pulse (!) 53   Temp 99.5 F (37.5 C)   Resp (!) 25   Ht 5\' 8"  (1.727 m)   Wt (!) 326 lb 9.6 oz (148.1 kg)   SpO2 100%   BMI 49.66 kg/m   Intake/output: I/O last 3 completed shifts: In: 382.8 [I.V.:222.8; Other:10; IV Piggyback:150] Out: -   General: sedated Neuro: opens eyes with stimulation, not following commands HEENT: ETT in place Cardiac: irregular, no murmur Chest: b/l crackles Abd: soft, non tender, + bowel sounds Ext: no edema Skin: no rashes   CMP Latest Ref Rng & Units 04/23/2016 03/12/2016 03/11/2016  Glucose 65 - 99 mg/dL 126(H) 81 75  BUN 6 - 20 mg/dL 11 8 9   Creatinine 0.44 - 1.00 mg/dL 1.13(H) 0.96 0.97  Sodium 135 - 145 mmol/L 137 144 144  Potassium 3.5 - 5.1 mmol/L 3.2(L) 4.0 4.2  Chloride 101 - 111 mmol/L 98(L) 104 106  CO2 22 - 32 mmol/L 24 35(H) 32  Calcium 8.9 - 10.3 mg/dL 8.2(L) 6.7(L) 6.5(L)  Total Protein 6.5 - 8.1 g/dL 7.5 - -  Total Bilirubin 0.3 - 1.2 mg/dL 0.9 - -  Alkaline Phos 38 - 126 U/L 90 - -  AST 15 - 41 U/L 34 - -  ALT 14 - 54 U/L 14 - -     CBC Latest Ref Rng & Units 04/23/2016 03/12/2016 03/11/2016  WBC 4.0 - 10.5 K/uL 14.6(H) 4.7 4.1  Hemoglobin 12.0 - 15.0 g/dL 14.3 11.0(L) 11.8(L)  Hematocrit 36.0 - 46.0 % 43.4 34.4(L) 37.3  Platelets 150 - 400 K/uL 160 95(L) 87(L)     ABG    Component Value Date/Time   PHART 7.405 04/23/2016 2038   PCO2ART 46.2 04/23/2016 2038   PO2ART 266.0 (H) 04/23/2016 2038   HCO3 28.9 (H) 04/23/2016 2038   TCO2 30 04/23/2016 2038   O2SAT 100.0 04/23/2016 2038     CBG (last 3)    Recent Labs  04/23/16 1850 04/24/16 0346 04/24/16 0820  GLUCAP 114* 141* 102*     Imaging: Ct Head Wo Contrast  Result Date: 04/23/2016 CLINICAL DATA:  Confusion and history of hypertension. EXAM: CT HEAD WITHOUT CONTRAST TECHNIQUE: Contiguous axial images were obtained from the base of the skull through the vertex without intravenous contrast. COMPARISON:  06/10/2015 FINDINGS: Brain: No mass lesion, hemorrhage, hydrocephalus, acute infarct, intra-axial, or extra-axial fluid collection. Vascular: No hyperdense vessel or unexpected calcification. Skull: Normal Sinuses/Orbits: Endotracheal and nasogastric tubes are incompletely imaged. Fluid in the oropharynx likely secondary. Normal orbits and globes. Minimal fluid in the sphenoid sinus. Clear mastoid air cells. Other: None IMPRESSION: 1.  No acute intracranial abnormality. 2. Sinus disease. Electronically Signed   By: Abigail Miyamoto M.D.   On: 04/23/2016 22:51   Dg Chest Port 1 View  Result Date: 04/23/2016 CLINICAL DATA:  Seizure with unresponsiveness EXAM: PORTABLE CHEST 1 VIEW COMPARISON:  08/03/2015 CXR FINDINGS: AP portable semi upright view of the chest was provided. The patient is slightly rotated. There is stable cardiomegaly with uncoiling of the thoracic aorta. Endotracheal tube tip is satisfactory at 5.1  cm above the carina. A gastric tube is seen extending towards the diaphragm. The tip is excluded. Patchy airspace opacities are seen in the right upper lobe suspicious for aspiration. Minimal bibasilar opacities may reflect areas of atelectasis and/or infiltrate as well. Osteoarthritis of the visualized glenohumeral joints. Skin fold artifact projects over the right lateral humeral head. IMPRESSION: 1. Cardiomegaly with bilateral airspace opacities predominantly in the right upper lobe with lesser degree of atelectasis and/or minimal infiltrate in both lung bases. Findings may reflect aspiration pneumonia. 2. Satisfactory endotracheal tube  tip position approximately 5.1 cm above the carina. A gastric tube is incompletely visualized but appears to extend below the diaphragm. Electronically Signed   By: Ashley Royalty M.D.   On: 04/23/2016 20:07     Studies: CT head 3/04 >> negative   Antibiotics: Vancomycin 3/04 >> Azactam 3/04 >> Levaquin 3/04 >>  Cultures: Blood 3/04 >> Urine 3/04 >>  Influenza 3/04 >> negative C diff 3/04 >>   Lines/tubes: ETT 3/04 >>  Events: 3/04 Admit  Summary: 66 yo female with acute encephalopathy, seizure, fever, aspiration PNA, VDRF.  Assessment/plan:  Acute encephalopathy with seizure. Hx of chronic pain. - monitor mental status - f/u EEG - RASS goal 0 to -1 - continue keppra per neurology  Acute hypoxic respiratory failure 2nd to aspiration and compromised airway. Hx of asthma, vocal cord dysfunction. - full vent support - f/u CXR - prn BDs  Elevated troponin. A fib with RVR. Fluctuating blood pressures. - f/u cardiac enzymes, ECG, Echo - continue heparin gtt  Aspiration pneumonia. - Day 2 of Abx  Diarrhea. - f/u stool for C diff  Hx of hypothyroidism. - f/u TSH  DVT prophylaxis - heparin gtt SUP - Pepcid Nutrition - tube feeds Goals of care - full code  CC time 36 minutes  Chesley Mires, MD [place] 04/24/2016, 9:53 AM Pager:  [phone] After 3pm call: [phone]

## 2016-04-24 NOTE — Progress Notes (Signed)
ANTICOAGULATION CONSULT NOTE - Follow Up Consult  Pharmacy Consult for heparin Indication: atrial fibrillation and NSTEMI  Allergies  Allergen Reactions  . Penicillins Other (See Comments)    intolerance    Patient Measurements: Height: 5\' 8"  (172.7 cm) Weight: (!) 326 lb 1 oz (147.9 kg) IBW/kg (Calculated) : 63.9 Heparin Dosing Weight: 100 kg  Vital Signs: Temp: 99.7 F (37.6 C) (03/05 1800) BP: 101/80 (03/05 1800) Pulse Rate: 79 (03/05 1745)  Labs:  Recent Labs  04/23/16 2001 04/24/16 0233  HGB 14.3  --   HCT 43.4  --   PLT 160  --   CREATININE 1.13*  --   CKTOTAL 201  --   TROPONINI  --  10.54*    Estimated Creatinine Clearance: 78.4 mL/min (by C-G formula based on SCr of 1.13 mg/dL (H)).   Medications:  Infusions:  . sodium chloride 250 mL (04/24/16 1836)  . feeding supplement (VITAL HIGH PROTEIN) 1,000 mL (04/24/16 1800)  . heparin 1,000 Units/hr (04/24/16 1835)  . propofol (DIPRIVAN) infusion 20 mcg/kg/min (04/24/16 1835)    Assessment: 66 y/o female with rising troponin/NSTEMI started on a heparin drip. Cardiology consulted and telemetry showed Afib. Heparin was stopped ~14:40 in preparation for central line placement. It was restarted ~18:00. No bleeding noted.  Goal of Therapy:  Heparin level 0.3-0.7 units/ml Monitor platelets by anticoagulation protocol: Yes   Plan:  - Increase heparin drip to 1400 units/hr for weight - 8 hr heparin level - Daily heparin level and CBC - Monitor for s/sx of bleeding   Renold Genta, PharmD, BCPS Clinical Pharmacist Phone for today - [facility] - [phone] 04/24/2016 7:00 PM

## 2016-04-24 NOTE — Progress Notes (Signed)
BP 68/51 after multiple attempts in multiple locations. Laurey Arrow NP CCM aware. Awaiting orders. Will continue to monitor. Bartholomew Crews, RN 04/24/2016 9:37 AM

## 2016-04-24 NOTE — Progress Notes (Signed)
Initial Nutrition Assessment  DOCUMENTATION CODES:   Morbid obesity  INTERVENTION:    Vital High Protein at 75 ml/h (1800 ml per day)  Provides 1800 kcal, 158 gm protein, 1505 ml free water daily  NUTRITION DIAGNOSIS:   Inadequate oral intake related to inability to eat as evidenced by NPO status.  GOAL:   Provide needs based on ASPEN/SCCM guidelines  MONITOR:   Vent status, TF tolerance, Labs, I & O's  REASON FOR ASSESSMENT:   Consult Enteral/tube feeding initiation and management  ASSESSMENT:   66 yo female brought to ER with seizure and altered mental status.  Intubated for airway protection.  She has PMHx of chronic pain, fibromyalgia, hypothyroidism, diastolic CHF, asthma.  She was febrile on admission with CXR concerning for aspiration.  Discussed patient in ICU rounds and with RN today. Awaiting confirmation of OGT placement, then will start TF. Abdominal xray today showed tip of OGT is just below the GE junction in the upper stomach.  Received MD Consult for TF initiation and management. Unable to complete Nutrition-Focused physical exam at this time.  Patient is currently intubated on ventilator support Temp (24hrs), Avg:100.5 F (38.1 C), Min:97.3 F (36.3 C), Max:105.6 F (40.9 C)  Propofol: currently off Labs reviewed: potassium 3.2, magnesium 1.2 Medications reviewed and include propofol (currently off).  Diet Order:  Diet NPO time specified  Skin:  Reviewed, no issues  Last BM:  3/5  Height:   Ht Readings from Last 1 Encounters:  04/23/16 5\' 8"  (1.727 m)    Weight:   Wt Readings from Last 1 Encounters:  04/24/16 (!) 326 lb (147.9 kg)    Ideal Body Weight:  63.6 kg  BMI:  Body mass index is 49.57 kg/m.  Estimated Nutritional Needs:   Kcal:  [id number]  Protein:  159 gm  Fluid:  2 L  EDUCATION NEEDS:   No education needs identified at this time  Molli Barrows, [place], [place], [place] Pager [phone] After Hours Pager [phone]

## 2016-04-24 NOTE — Progress Notes (Signed)
eLink Physician-Brief Progress Note Patient Name: Jillian Eaton DOB: Oct 22, 1950 MRN: [id number]   Date of Service  04/24/2016  HPI/Events of Note  T 105F. Pt on empiric abx. Tylenol given approx 6 h ago  eICU Interventions  Repeat tylenol x 1, ibuprofen x 1 Cooling blanket On aztreonam, levaquin, vanco     Intervention Category Intermediate Interventions: Other:  Knowledge Escandon S. 04/24/2016, 1:36 AM

## 2016-04-24 NOTE — Progress Notes (Signed)
Patient is on the ventilator and unable to give history for care plan or patient education. Will reassess as family and patient's status changes. Modena Morrow E, RN 04/24/2016 2:13 PM

## 2016-04-24 NOTE — Procedures (Signed)
Central Venous Catheter Insertion Procedure Note Jillian Eaton [facility] [id number] 1951/01/29  Procedure: Insertion of Central Venous Catheter Indications: Assessment of intravascular volume, Drug and/or fluid administration and Frequent blood sampling  Procedure Details Consent: Risks of procedure as well as the alternatives and risks of each were explained to the (patient/caregiver).  Consent for procedure obtained. Time Out: Verified patient identification, verified procedure, site/side was marked, verified correct patient position, special equipment/implants available, medications/allergies/relevent history reviewed, required imaging and test results available.  Performed  Maximum sterile technique was used including antiseptics, cap, gloves, gown, hand hygiene, mask and sheet. Skin prep: Chlorhexidine; local anesthetic administered A antimicrobial bonded/coated triple lumen catheter was placed in the left internal jugular vein using the Seldinger technique.  Evaluation Blood flow good Complications: No apparent complications Patient did tolerate procedure well. Chest X-ray ordered to verify placement.  CXR: pending.  Procedure performed under direct ultrasound guidance for real time vessel cannulation.      [name], [facility] Pager: [phone]  or [phone] 04/24/2016, 5:52 PM

## 2016-04-24 NOTE — Progress Notes (Signed)
EEG Completed; Results Pending  

## 2016-04-24 NOTE — Progress Notes (Addendum)
Pharmacy Antibiotic Note  Jillian Eaton is a 66 y.o. female admitted on 04/23/2016 with aspiration pneumonia.  Pharmacy has been consulted for change to Cefepime and continue Vancomycin dosing.  Plan: After discussion with Dr. Halford Chessman, antibiotics will be changed from Aztreonam and Levaquin to Cefepime 1g IV every 8 hours..  Continue Vancomycin 1250mg  IV every 24 hours.  Monitor renal function, culture results, and clinical status Consider stopping vancomycin soon  Height: 5\' 8"  (172.7 cm) Weight: (!) 326 lb (147.9 kg) IBW/kg (Calculated) : 63.9  Temp (24hrs), Avg:100.5 F (38.1 C), Min:97.3 F (36.3 C), Max:105.6 F (40.9 C)   Recent Labs Lab 04/23/16 2001 04/23/16 2015  WBC 14.6*  --   CREATININE 1.13*  --   LATICACIDVEN  --  1.51    Estimated Creatinine Clearance: 78.4 mL/min (by C-G formula based on SCr of 1.13 mg/dL (H)).    Allergies  Allergen Reactions  . Penicillins Other (See Comments)    intolerance    Antimicrobials this admission: Aztreonam 3/4>>3/5 Levoflox 3/4>>3/5 Cefepime 3/5 >> Vancomycin 3/4>>  Dose adjustments this admission:   Microbiology results: 3/4 BCx: 3/4 CDiff: negative 3/4 urine: 3/4 MRSA pcr negative 3/4 Influenza negative  Thank you for allowing pharmacy to be a part of this patient's care.  Sloan Leiter, PharmD, BCPS Clinical Pharmacist Clinical phone 04/24/2016 until 3:30 PM- [phone] After hours, please call [phone] 04/24/2016 12:55 PM

## 2016-04-24 NOTE — Progress Notes (Signed)
[place] Progress Note Patient Name: LANEESHA ZIMMERLY DOB: Aug 05, 1950 MRN: [id number]   Date of Service  04/24/2016  HPI/Events of Note  Troponin 0.8 >> 10.54 >> 08:00 Note cath from 2012 no CAD, hx PAH. Last TTE 5/17   eICU Interventions  - empiric heparin gtt - ECG now - order TTE - check [id number] with next troponin draw, ? rhabdo since found down - cards consultation in am     Intervention Category Major Interventions: Other:  Latissa Frick S. 04/24/2016, 3:57 AM

## 2016-04-24 NOTE — Progress Notes (Signed)
ANTICOAGULATION CONSULT NOTE - Initial Consult  Pharmacy Consult for Heparin  Indication: chest pain/ACS  Allergies  Allergen Reactions  . Penicillins Other (See Comments)    intolerance   Patient Measurements: Height: 5\' 8"  (172.7 cm) Weight: 244 lb 0.8 oz (110.7 kg) IBW/kg (Calculated) : 63.9  Vital Signs: Temp: 103.8 F (39.9 C) (03/05 0330) Temp Source: Oral (03/04 2341) BP: 90/63 (03/05 0200) Pulse Rate: 53 (03/05 0330)  Labs:  Recent Labs  04/23/16 2001 04/24/16 0233  HGB 14.3  --   HCT 43.4  --   PLT 160  --   CREATININE 1.13*  --   CKTOTAL 201  --   TROPONINI  --  10.54*    Estimated Creatinine Clearance: 66.4 mL/min (by C-G formula based on SCr of 1.13 mg/dL (H)).   Medical History: Past Medical History:  Diagnosis Date  . Anemia   . Asthma   . Chronic headache   . DOE (dyspnea on exertion)    2D ECHO, 02/12/2012 - EF 60-65%, moderate concentric hypertrophy  . Fibromyalgia    nerve pain"left side at waist level" "can't lay on that side without pain" , "HOB elevation helps"  . Fibromyalgia   . Heart murmur   . Hematuria - cause not known   . History of kidney stones    x 2 '13, '14 surgery to remove  . Hypertension   . SOB (shortness of breath)    NUCLEAR STRESS TEST, 03/02/2009 - no ischemic ST changes or symptoms  . Thyroid disease    "goiter"  . Transfusion history    10 yrs+  . Urinary incontinence, functional      Assessment: Starting heparin drip for elevated troponin (0.87>>10.54), pt was found down at house, CBC/renal function ok, PTA meds reviewed.   Goal of Therapy:  Heparin level 0.3-0.7 units/ml Monitor platelets by anticoagulation protocol: Yes   Plan:  Heparin 4000 units BOLUS Start heparin drip at 1000 units/hr 1300 HL Daily CBC/HL Monitor for bleeding   Narda Bonds 04/24/2016,4:22 AM

## 2016-04-24 NOTE — Consult Note (Signed)
CARDIOLOGY CONSULT NOTE   Patient ID: Jillian Eaton MRN: [id number] DOB/AGE: [birth year] 66 y.o.  Admit date: 04/23/2016  Primary Physician   Red Christians, MD Primary Cardiologist   Dr [name] 07/02/2015 Reason for Consultation   Elevated troponin Requesting MD: Dr Vaughan Browner  [id number] Jillian Eaton is a 66 y.o. 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  03/04, pt found unresponsive, intubated in the field, ?sz at home and en route to ER, incontinence of bowel and bladder, temp 103, mild leukocytosis with possible PNA on CXR. Troponin trended up, pt went in to [place] I, Atrial fib, RVR and cards asked to see.    Jillian Eaton is sedated on the vent, she was moving around when sedation was decreased earlier.   No family is available. Information obtained from records and staff.   Past Medical History:  Diagnosis Date  . Anemia   . Asthma   . Chronic headache   . DOE (dyspnea on exertion)    2D ECHO, 02/12/2012 - EF 60-65%, moderate concentric hypertrophy  . Fibromyalgia    nerve pain"left side at waist level" "can't lay on that side without pain" , "HOB elevation helps"  . Fibromyalgia   . Heart murmur   . Hematuria - cause not known   . History of kidney stones    x 2 '13, '14 surgery to remove  . Hypertension   . SOB (shortness of breath)    NUCLEAR STRESS TEST, 03/02/2009 - no ischemic ST changes or symptoms  . Thyroid disease    "goiter"  . Transfusion history    10 yrs+  . Urinary incontinence, functional      Past Surgical History:  Procedure Laterality Date  . CARDIAC CATHETERIZATION  04/04/2010   No significant obstructive coronary artery  disease  . CHOLECYSTECTOMY  1990  . COLONOSCOPY W/ POLYPECTOMY    . COLONOSCOPY WITH PROPOFOL N/A 04/10/2014   Procedure: COLONOSCOPY WITH PROPOFOL;  Surgeon: Beryle Beams, MD;  Location: [facility];  Service: Endoscopy;  Laterality: N/A;  . DIAGNOSTIC LAPAROSCOPY     x2 bowel obstructions(adhesions)  . 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 THYROIDECTOMY    . TUBAL LIGATION  1986    Allergies  Allergen Reactions  . Penicillins Other (See Comments)    intolerance   I have reviewed the patient's current medications . aspirin EC  81 mg Oral Daily  . ceFEPime (MAXIPIME) IV  1 g Intravenous Q8H  . chlorhexidine gluconate (MEDLINE KIT)  15 mL Mouth Rinse BID  . famotidine  20 mg Per Tube BID  . levETIRAcetam  750 mg Intravenous BID  . mouth rinse  15 mL Mouth Rinse 10 times per day  . vancomycin  1,250 mg Intravenous Q24H   . sodium chloride 250 mL (04/24/16 1400)  . feeding supplement (VITAL HIGH PROTEIN) 1,000 mL (04/24/16 1400)  . heparin 1,000 Units/hr (04/24/16 1400)  . propofol (DIPRIVAN) infusion 20 mcg/kg/min (04/24/16 1411)   acetaminophen (TYLENOL) oral liquid 160 mg/5 mL, albuterol, fentaNYL (SUBLIMAZE) injection, fentaNYL (SUBLIMAZE) injection  Medication Sig  amLODipine (NORVASC) 5 MG tablet Take 5 mg by mouth daily.  celecoxib (CELEBREX) 200 MG capsule Take 200 mg by mouth every morning.   cyclobenzaprine (FLEXERIL) 10 MG tablet Take 1 tablet (10 mg total) by mouth 3 (three) times daily as needed for muscle spasms.  DULoxetine (CYMBALTA) 60 MG capsule Take 60 mg by mouth every evening.   Eszopiclone (ESZOPICLONE) 3 MG TABS Take 1 tablet (3 mg total) by mouth at bedtime as needed. Take immediately before bedtime Patient taking differently: Take 3 mg by mouth at bedtime. Take immediately before bedtime   furosemide (LASIX) 20 MG tablet Take 1 tablet (20 mg total) by mouth daily.  levothyroxine (SYNTHROID, LEVOTHROID) 150 MCG  tablet Take 2 tablets (300 mcg total) by mouth daily before breakfast.  losartan (COZAAR) 100 MG tablet Take 100 mg by mouth every morning.   mirabegron ER (MYRBETRIQ) 50 MG TB24 Take 50 mg by mouth every morning.   Oxycodone HCl 10 MG TABS Take 10 mg by mouth See admin instructions. Take 10 mg by mouth up to 3 times daily as needed for up to 15 days  pantoprazole (PROTONIX) 40 MG tablet Take 1 tablet (40 mg total) by mouth at bedtime.  pregabalin (LYRICA) 200 MG capsule Take 200 mg by mouth 3 (three) times daily.    Vitamin D, Ergocalciferol, (DRISDOL) 50000 units CAPS capsule Take 50,000 Units by mouth every 7 (seven) days.  acetaminophen (TYLENOL) 650 MG CR tablet Take 650 mg by mouth every 6 (six) hours.  albuterol (PROAIR HFA) 108 (90 BASE) MCG/ACT inhaler Inhale 2 puffs into the lungs every 6 (six) hours as needed for wheezing or shortness of breath.   budesonide-formoterol (SYMBICORT) 160-4.5 MCG/ACT inhaler Inhale 2 puffs into the lungs 2 (two) times daily as needed (shortness of breath.).   calcium carbonate (CALCIUM 600) 600 MG TABS tablet Take 600 mg by mouth 2 (two) times daily with a meal.  oxyCODONE (OXY IR/ROXICODONE) 5 MG immediate release tablet Take 1 tablet (5 mg total) by mouth every 6 (six) hours as needed for moderate pain. Patient not taking: Reported on 04/24/2016     Social History   Social History  . Marital status: Married    Spouse name: N/A  . Number of children: 3  . Years of education: N/A   Occupational History  . 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 Smoking status: Never Smoker  . Smokeless tobacco: Never Used  . Alcohol use No     Comment: wine occ  . Drug use: Yes    Types: Oxycodone     Comment: perscribed  . Sexual activity: Not Currently    Birth control/ protection: None   Other Topics Concern  . Not on file   Social History Narrative   ** Merged History Encounter **        Family Status  Relation Status  .  Mother Deceased at age 26  . Father Deceased at age 69  . Sister Deceased at age 86  . Maternal Grandmother Deceased  . Maternal Grandfather Deceased at age 77s  . Paternal Grandmother Deceased  . Paternal Grandfather Deceased at age 18s  . Sister Alive  . Sister Alive  . Sister Alive  . Child Alive  . Child Alive  . Child Alive   Family History  Problem Relation Age of Onset  . Diabetes Mother   . Epilepsy Mother   . Cancer Mother     Breast  . Hypertension Mother   . Breast cancer Mother   .  Kidney disease Father   . Diabetes Father   . Hypertension Father   . Asthma Father   . Heart disease Father   . Epilepsy Sister   . Cancer Maternal Grandmother   . Breast cancer Maternal Grandmother   . Cancer Paternal Grandmother   . Breast cancer Paternal Grandmother      ROS:  Full 14 point review of systems complete and found to be negative unless listed above.  Physical Exam: Blood pressure 111/90, pulse 81, temperature 99.7 F (37.6 C), resp. rate 19, height _0  (1.727 m), weight (!) 326 lb (147.9 kg), SpO2 100 %.  General: Well developed, well nourished, female in no acute distress Head: Eyes PERRLA, No xanthomas.   Normocephalic and atraumatic, oropharynx without edema or exudate. Dentition: poor Lungs: scattered rales Heart: Heart irregular rate and rhythm with S1, S2, no murmur. pulses are 2+ all 4 extrem.   Neck: No carotid bruits. No lymphadenopathy.  JVD elevated but head is not at 30 degrees.  Abdomen: Bowel sounds present, abdomen soft and non-tender without masses or hernias noted. Msk:   No joint deformities or effusions. Unable to ck for weakness Extremities: No clubbing or cyanosis. No edema.  Neuro: sedated on the vent Skin: No rashes or lesions noted.  Labs:   Lab Results  Component Value Date   WBC 14.6 (H) 04/23/2016   HGB 14.3 04/23/2016   HCT 43.4 04/23/2016   MCV 97.1 04/23/2016   PLT 160 04/23/2016    Recent Labs Lab 04/23/16 2001  NA  137  K 3.2*  CL 98*  CO2 24  BUN 11  CREATININE 1.13*  CALCIUM 8.2*  PROT 7.5  BILITOT 0.9  ALKPHOS 90  ALT 14  AST 34  GLUCOSE 126*  ALBUMIN 3.8   Magnesium  Date Value Ref Range Status  04/23/2016 1.2 (L) 1.7 - 2.4 mg/dL Final    Recent Labs  04/23/16 2001 04/24/16 0233  CKTOTAL 201  --   TROPONINI  --  10.54*    Recent Labs  04/23/16 2013  TROPIPOC 0.87*   Lab Results  Component Value Date   TRIG 64 04/23/2016   TSH  Date/Time Value Ref Range Status  03/10/2016 07:56 AM 68.907 (H) 0.350 - 4.500 uIU/mL Final    Comment:    Performed by a 3rd Generation assay with a functional sensitivity of <=0.01 uIU/mL.   Drugs of Abuse     Component Value Date/Time   LABOPIA POSITIVE (A) 04/23/2016 2359   COCAINSCRNUR NONE DETECTED 04/23/2016 2359   LABBENZ POSITIVE (A) 04/23/2016 2359   AMPHETMU NONE DETECTED 04/23/2016 2359   THCU NONE DETECTED 04/23/2016 2359   LABBARB NONE DETECTED 04/23/2016 2359    Echo: ordered, results pending  ECG:  03/04 ST w/ HR 111, frequent PACs vs atrial flutter w/ 2:1 conduction first half of ECG  03/05 at 4 am ST underlying, [place] 1   Cath: 04/04/2010 FINDINGS: 1. Left main - short, no disease. 2. LAD - no significant disease. 3. Left circumflex, no significant disease. 4. RCA - dominant, no disease, large-caliber vessel. 5. LVEDP = 20 mmHg. 6. RA - 12. 7. RV 38/12. 8. PA - 43/19 (31). 9. PCWP - 24. 10.TPG - 7. 11.Fick cardiac output/Fick cardiac index - 10.56/3.84. 12.Thermodilution cardiac output/thermodilution cardiac index - 6.78/2.47. 13.Aortic saturation - 94%. 14.PA saturation - 68%. IMPRESSION: 1. No significant obstructive coronary artery disease. 2. LVEDP = 20 mmHg. 3. Borderline pulmonary venous hypertension. 4. High cardiac output.  Radiology:  Ct Head Wo Contrast Result Date: 04/23/2016 CLINICAL DATA:  Confusion and history of hypertension. EXAM: CT HEAD WITHOUT CONTRAST TECHNIQUE: Contiguous  axial images were obtained from the base of the skull through the vertex without intravenous contrast. COMPARISON:  06/10/2015 FINDINGS: Brain: No mass lesion, hemorrhage, hydrocephalus, acute infarct, intra-axial, or extra-axial fluid collection. Vascular: No hyperdense vessel or unexpected calcification. Skull: Normal Sinuses/Orbits: Endotracheal and nasogastric tubes are incompletely imaged. Fluid in the oropharynx likely secondary. Normal orbits and globes. Minimal fluid in the sphenoid sinus. Clear mastoid air cells. Other: None IMPRESSION: 1.  No acute intracranial abnormality. 2. Sinus disease. Electronically Signed   By: Abigail Miyamoto M.D.   On: 04/23/2016 22:51   Dg Chest Port 1 View Result Date: 04/23/2016 CLINICAL DATA:  Seizure with unresponsiveness EXAM: PORTABLE CHEST 1 VIEW COMPARISON:  08/03/2015 CXR FINDINGS: AP portable semi upright view of the chest was provided. The patient is slightly rotated. There is stable cardiomegaly with uncoiling of the thoracic aorta. Endotracheal tube tip is satisfactory at 5.1 cm above the carina. A gastric tube is seen extending towards the diaphragm. The tip is excluded. Patchy airspace opacities are seen in the right upper lobe suspicious for aspiration. Minimal bibasilar opacities may reflect areas of atelectasis and/or infiltrate as well. Osteoarthritis of the visualized glenohumeral joints. Skin fold artifact projects over the right lateral humeral head. IMPRESSION: 1. Cardiomegaly with bilateral airspace opacities predominantly in the right upper lobe with lesser degree of atelectasis and/or minimal infiltrate in both lung bases. Findings may reflect aspiration pneumonia. 2. Satisfactory endotracheal tube tip position approximately 5.1 cm above the carina. A gastric tube is incompletely visualized but appears to extend below the diaphragm. Electronically Signed   By: Ashley Royalty M.D.   On: 04/23/2016 20:07   Dg Abd Portable 1v Result Date: 04/24/2016 CLINICAL  DATA:  Evaluate orogastric tube placement EXAM: PORTABLE ABDOMEN - 1 VIEW COMPARISON:  June 08, 2013 FINDINGS: The OG tube terminates with the side-port just below the GE junction and the distal tip just within the stomach. IMPRESSION: The side port and distal tip of the OG tube are just below the GE junction in the upper stomach. Electronically Signed   By: Dorise Bullion III M.D   On: 04/24/2016 12:03    ASSESSMENT AND PLAN:   The patient was seen today by Dr Burt Knack, the patient evaluated and the data reviewed.   1. NSTEMI by enzymes - cause unclear - ECG abnl but no acute ischemic changes - echo performed, results pending - pt currently not a candidate for invasive evaluation - on ASA, no BB/ACE due to soft BP, she has needed IVF for pressure support - MD advise on adding statin - review echo before deciding on additional evaluation  2. Arrhythmia - underlying tachycardia - telemetry and ECGs reviewed - no clear afib, but may have had some atrial flutter - continue to follow on telemetry  3. Hypothyroid - home dose Synthroid held - TSH was elevated 02/2016 admit - recheck.   4. Metabolic abnormalities - Mg 1.2, supp given - K+ 3.2, IVF had K+ in it for a time - recheck when central line inserted  Otherwise, per CCM Active Problems:   Toxic metabolic encephalopathy   SignedRosaria Ferries, PA-C 04/24/2016 2:22 PM Beeper [phone]  Co-Sign MD  Patient seen, examined. Available data reviewed. Agree with findings, assessment, and plan as outlined by Rosaria Ferries, PA-C. The patient is independently interviewed and examined in the medical  ICU. Her husband, daughter, and knee surgery at the bedside. The patient presents with unresponsiveness and acute on chronic respiratory failure at home. There is a question of unintentional narcotic overdose versus seizure. We were asked to see her because of elevated troponin.  On my evaluation, the patient is a morbidly obese woman,  sedated on the ventilator. She is unresponsive but has been responsive per nursing. Lung fields are coarse bilaterally. Heart is irregular and tachycardic. Abdomen is soft and obese, there is diffuse peripheral edema.  Laboratory data, telemetry, and echo data is reviewed. The patient has severe global and segmental LV dysfunction with LVEF less than 30%. See formal report copied below:  Study Conclusions  - Left ventricle: Diffuse hypokinesis worse in the inferior wall   The cavity size was mildly dilated. Wall thickness was normal.   Systolic function was severely reduced. The estimated ejection   fraction was in the range of 25% to 30%. Doppler parameters are   consistent with both elevated ventricular end-diastolic filling   pressure and elevated left atrial filling pressure. - Mitral valve: There was mild regurgitation. - Left atrium: The atrium was mildly dilated. - Atrial septum: No defect or patent foramen ovale was identified.  Telemetry demonstrates atrial fibrillation. An EKG from 0404 this morning demonstrates atrial tachycardia with variable block, but this seems to have resolved now. Will repeat an EKG tomorrow morning. Troponin is elevated to 10 mg/dL. The patient has a history of normal coronary arteries by cardiac catheterization. I think it is likely that she has had diffuse subendocardial ischemia related to hypoxic injury. Hopefully her LV function will recover with supportive measures. She should be maintained on IV heparin in the setting of atrial fibrillation and elevated troponin until her enzymes can be further cycled. There is no evidence of ST elevation on her EKG. Will follow with you and assess whether further cardiac studies are indicated depending on her overall course.  Sherren Mocha, M.D. 04/24/2016 5:50 PM

## 2016-04-25 ENCOUNTER — Inpatient Hospital Stay (HOSPITAL_COMMUNITY): Payer: Federal, State, Local not specified - PPO

## 2016-04-25 DIAGNOSIS — G40909 Epilepsy, unspecified, not intractable, without status epilepticus: Secondary | ICD-10-CM

## 2016-04-25 DIAGNOSIS — R569 Unspecified convulsions: Secondary | ICD-10-CM

## 2016-04-25 LAB — TSH: TSH: 9.239 u[IU]/mL — AB (ref 0.350–4.500)

## 2016-04-25 LAB — GLUCOSE, CAPILLARY
GLUCOSE-CAPILLARY: 106 mg/dL — AB (ref 65–99)
GLUCOSE-CAPILLARY: 61 mg/dL — AB (ref 65–99)
GLUCOSE-CAPILLARY: 66 mg/dL (ref 65–99)
GLUCOSE-CAPILLARY: 85 mg/dL (ref 65–99)
GLUCOSE-CAPILLARY: 87 mg/dL (ref 65–99)
Glucose-Capillary: 74 mg/dL (ref 65–99)
Glucose-Capillary: 83 mg/dL (ref 65–99)
Glucose-Capillary: 88 mg/dL (ref 65–99)
Glucose-Capillary: 86 mg/dL (ref 65–99)

## 2016-04-25 LAB — URINE CULTURE: Culture: NO GROWTH

## 2016-04-25 LAB — CBC
HEMATOCRIT: 32.9 % — AB (ref 36.0–46.0)
HEMOGLOBIN: 10.8 g/dL — AB (ref 12.0–15.0)
MCH: 32 pg (ref 26.0–34.0)
MCHC: 32.8 g/dL (ref 30.0–36.0)
MCV: 97.3 fL (ref 78.0–100.0)
Platelets: 118 10*3/uL — ABNORMAL LOW (ref 150–400)
RBC: 3.38 MIL/uL — AB (ref 3.87–5.11)
RDW: 14.7 % (ref 11.5–15.5)
WBC: 10.5 10*3/uL (ref 4.0–10.5)

## 2016-04-25 LAB — BASIC METABOLIC PANEL
ANION GAP: 8 (ref 5–15)
BUN: 24 mg/dL — ABNORMAL HIGH (ref 6–20)
CO2: 27 mmol/L (ref 22–32)
Calcium: 7.1 mg/dL — ABNORMAL LOW (ref 8.9–10.3)
Chloride: 105 mmol/L (ref 101–111)
Creatinine, Ser: 1.14 mg/dL — ABNORMAL HIGH (ref 0.44–1.00)
GFR, EST AFRICAN AMERICAN: 58 mL/min — AB (ref >60)
GFR, EST NON AFRICAN AMERICAN: 50 mL/min — AB (ref >60)
GLUCOSE: 112 mg/dL — AB (ref 65–99)
POTASSIUM: 2.9 mmol/L — AB (ref 3.5–5.1)
Sodium: 140 mmol/L (ref 135–145)

## 2016-04-25 LAB — HEPARIN LEVEL (UNFRACTIONATED)
HEPARIN UNFRACTIONATED: 0.18 [IU]/mL — AB (ref 0.30–0.70)
Heparin Unfractionated: 0.58 IU/mL (ref 0.30–0.70)
Heparin Unfractionated: 0.48 IU/mL (ref 0.30–0.70)

## 2016-04-25 LAB — PHOSPHORUS
PHOSPHORUS: 3.5 mg/dL (ref 2.5–4.6)
PHOSPHORUS: 3.6 mg/dL (ref 2.5–4.6)

## 2016-04-25 LAB — MAGNESIUM
Magnesium: 1.9 mg/dL (ref 1.7–2.4)
Magnesium: 1.8 mg/dL (ref 1.7–2.4)

## 2016-04-25 LAB — TROPONIN I
TROPONIN I: 2.03 ng/mL — AB (ref <0.03)
Troponin I: 3.17 ng/mL (ref <0.03)

## 2016-04-25 MED ORDER — DEXTROSE 50 % IV SOLN
INTRAVENOUS | Status: AC
  Administered 2016-04-25: 50 mL
  Filled 2016-04-25: qty 50

## 2016-04-25 MED ORDER — LEVOTHYROXINE SODIUM 100 MCG PO TABS
100.0000 ug | ORAL_TABLET | Freq: Every day | ORAL | Status: DC
  Administered 2016-04-26: 100 ug
  Filled 2016-04-25: qty 1

## 2016-04-25 MED ORDER — SODIUM CHLORIDE 0.9 % IV SOLN: 250.0000 mL | INTRAVENOUS | Status: DC

## 2016-04-25 MED ORDER — POTASSIUM CHLORIDE 20 MEQ/15ML (10%) PO SOLN
40.0000 meq | ORAL | Status: AC
  Administered 2016-04-25 (×2): 40 meq
  Filled 2016-04-25 (×2): qty 30

## 2016-04-25 MED ORDER — POTASSIUM CHLORIDE 20 MEQ/15ML (10%) PO SOLN
40.0000 meq | Freq: Once | ORAL | Status: AC
  Administered 2016-04-25: 40 meq
  Filled 2016-04-25: qty 30

## 2016-04-25 MED ORDER — FUROSEMIDE 10 MG/ML IJ SOLN
40.0000 mg | Freq: Once | INTRAMUSCULAR | Status: AC
  Administered 2016-04-25: 40 mg via INTRAVENOUS
  Filled 2016-04-25: qty 4

## 2016-04-25 MED ORDER — HEPARIN BOLUS VIA INFUSION
2000.0000 [IU] | Freq: Once | INTRAVENOUS | Status: AC
  Administered 2016-04-25: 2000 [IU] via INTRAVENOUS
  Filled 2016-04-25: qty 2000

## 2016-04-25 MED ORDER — PROPOFOL 1000 MG/100ML IV EMUL
0.0000 ug/kg/min | INTRAVENOUS | Status: DC
  Administered 2016-04-25: 40 ug/kg/min via INTRAVENOUS
  Administered 2016-04-25: 20 ug/kg/min via INTRAVENOUS
  Administered 2016-04-25: 30 ug/kg/min via INTRAVENOUS
  Administered 2016-04-25: 35 ug/kg/min via INTRAVENOUS
  Administered 2016-04-25: 40 ug/kg/min via INTRAVENOUS
  Administered 2016-04-25: 15.038 ug/kg/min via INTRAVENOUS
  Administered 2016-04-26 (×3): 40 ug/kg/min via INTRAVENOUS
  Filled 2016-04-25 (×5): qty 100
  Filled 2016-04-25: qty 200

## 2016-04-25 NOTE — Progress Notes (Signed)
Pt placed back on full support at this time due to increased WOB, inc RR >40, pt tolerating full support well, RT will monitor

## 2016-04-25 NOTE — Progress Notes (Addendum)
ANTICOAGULATION & ANTIBIOTIC CONSULT NOTE - Follow Up Consult  Pharmacy Consult for heparin; Cefepime and Vancomycin Indication: atrial fibrillation and NSTEMI; sepsis  Allergies  Allergen Reactions  . Penicillins Other (See Comments)    intolerance    Patient Measurements: Height: 5\' 8"  (172.7 cm) Weight: (!) 325 lb (147.4 kg) IBW/kg (Calculated) : 63.9 Heparin Dosing Weight: 100 kg  Vital Signs: Temp: 97.5 F (36.4 C) (03/06 1240) Temp Source: Oral (03/06 1240) BP: 113/78 (03/06 1230) Pulse Rate: 70 (03/06 1200)  Labs:  Recent Labs  04/23/16 2001  04/24/16 1826 04/25/16 0000 04/25/16 0250 04/25/16 1051  HGB 14.3  --   --   --  10.8*  --   HCT 43.4  --   --   --  32.9*  --   PLT 160  --   --   --  118*  --   HEPARINUNFRC  --   --   --   --  0.18* 0.58  CREATININE 1.13*  --   --   --  1.14*  --   CKTOTAL 201  --   --   --   --   --   TROPONINI  --   < > 4.10* 3.17*  --  2.03*  < > = values in this interval not displayed.  Estimated Creatinine Clearance: 77.6 mL/min (by C-G formula based on SCr of 1.14 mg/dL (H)).   Medications:  Infusions:  . sodium chloride 250 mL (04/25/16 1200)  . feeding supplement (VITAL HIGH PROTEIN) 1,000 mL (04/25/16 1200)  . heparin 1,750 Units/hr (04/25/16 1200)  . propofol (DIPRIVAN) infusion 35 mcg/kg/min (04/25/16 1305)    Assessment: 66 y/o female with rising troponin/NSTEMI started on a heparin drip. Cardiology consulted and telemetry showed Afib. Heparin level after bolus and increase is therapeutic at 0.58. No bleeding notd. H/H and Plts low- consistent with past labs. Troponin trending down.   Infectious disease: WBC trending down, Tm 100.2 (fever curve down).  PCN allergy noted- but intolerance and has tolerated cephalosporins in past.  SCr 1.13 with normalized CrCl ~65-70 mL/min. Weight corrected in system and vancomycin under-dosed. Discussed with Dr Halford Chessman as clinically improving and agree to narrow to Cefepime alone.    Aztreonam 3/4>>3/5 Levofloxacin 3/4>>3/5 Cefepime 3/5 >> Vancomycin 3/4>> 3/6  3/4 BCx: ngtd 3/4 CDiff: negative 3/4 urine: negative 3/4 MRSA pcr negative 3/4 Influenza negative  Goal of Therapy:  Heparin level 0.3-0.7 units/ml Monitor platelets by anticoagulation protocol: Yes   Plan:  - Continue heparin at 1750 units/hr - 8 hr heparin level to confirm - Daily heparin level and CBC - Monitor for s/sx of bleeding  - Continue Cefepime 1 gram IV every 8 hours.  - Discontinue Vancomycin per discussion with Dr. Halford Chessman - Will follow-up renal function, cultures, and clinical status  Sloan Leiter, PharmD, BCPS Clinical Pharmacist Phone for today - [place] - [phone] 04/25/2016 1:36 PM

## 2016-04-25 NOTE — Progress Notes (Signed)
ANTICOAGULATION CONSULT NOTE - Follow Up Consult  Pharmacy Consult for heparin Indication: atrial fibrillation and NSTEMI  Allergies  Allergen Reactions  . Penicillins Other (See Comments)    intolerance    Patient Measurements: Height: 5\' 8"  (172.7 cm) Weight: (!) 325 lb (147.4 kg) IBW/kg (Calculated) : 63.9 Heparin Dosing Weight: 100 kg  Vital Signs: Temp: 97.5 F (36.4 C) (03/06 1611) Temp Source: Oral (03/06 1611) BP: 97/63 (03/06 1800) Pulse Rate: 78 (03/06 1800)  Labs:  Recent Labs  04/23/16 2001  04/24/16 1826 04/25/16 0000 04/25/16 0250 04/25/16 1051 04/25/16 1804  HGB 14.3  --   --   --  10.8*  --   --   HCT 43.4  --   --   --  32.9*  --   --   PLT 160  --   --   --  118*  --   --   HEPARINUNFRC  --   --   --   --  0.18* 0.58 0.48  CREATININE 1.13*  --   --   --  1.14*  --   --   CKTOTAL 201  --   --   --   --   --   --   TROPONINI  --   < > 4.10* 3.17*  --  2.03*  --   < > = values in this interval not displayed.  Estimated Creatinine Clearance: 77.6 mL/min (by C-G formula based on SCr of 1.14 mg/dL (H)).   Medications:  Infusions:  . sodium chloride 250 mL (04/25/16 1800)  . feeding supplement (VITAL HIGH PROTEIN) 1,000 mL (04/25/16 1825)  . heparin 1,750 Units/hr (04/25/16 1800)  . propofol (DIPRIVAN) infusion 40 mcg/kg/min (04/25/16 1829)    Assessment: 66 y/o female with rising troponin/NSTEMI started on a heparin drip. Cardiology consulted and telemetry showed Afib. Heparin level is therapeutic at 0.48 on 1750 units/hr. No bleeding noted.  Goal of Therapy:  Heparin level 0.3-0.7 units/ml Monitor platelets by anticoagulation protocol: Yes   Plan:  - Continue heparin drip at 1750 units/hr - Daily heparin level and CBC - Monitor for s/sx of bleeding   Renold Genta, PharmD, BCPS Clinical Pharmacist Phone for today - [place] - [phone] 04/25/2016 7:10 PM

## 2016-04-25 NOTE — Progress Notes (Signed)
[facility] ADULT ICU REPLACEMENT PROTOCOL FOR AM LAB REPLACEMENT ONLY  The patient does apply for the [facility] Adult ICU Electrolyte Replacment Protocol based on the criteria listed below:   1. Is GFR >/= 40 ml/min? Yes.    Patient's GFR today is 58 2. Is urine output >/= 0.5 ml/kg/hr for the last 6 hours? Yes.   Patient's UOP is 0.5 ml/kg/hr 3. Is BUN < 60 mg/dL? Yes.    Patient's BUN today is 24 4. Abnormal electrolyte(s): K+2.9 5. Ordered repletion with: Protocol 6. If a panic level lab has been reported, has the CCM MD in charge been notified? Yes.  .   Physician:  Merlene Laughter Hilliard 04/25/2016 4:04 AM

## 2016-04-25 NOTE — Progress Notes (Signed)
[facility] for heparin Indication: atrial fibrillation and NSTEMI  Allergies  Allergen Reactions  . Penicillins Other (See Comments)    intolerance    Patient Measurements: Height: 5\' 8"  (172.7 cm) Weight: (!) 320 lb (145.2 kg) (changed propofol gtt to match correct weight) IBW/kg (Calculated) : 63.9 Heparin Dosing Weight: 100 kg  Vital Signs: Temp: 99.9 F (37.7 C) (03/06 0200) Temp Source: Core (Comment) (03/06 0000) BP: 105/73 (03/06 0200) Pulse Rate: 78 (03/06 0306)  Labs:  Recent Labs  04/23/16 2001 04/24/16 0233 04/24/16 1826 04/25/16 0000 04/25/16 0250  HGB 14.3  --   --   --  10.8*  HCT 43.4  --   --   --  32.9*  PLT 160  --   --   --  PENDING  HEPARINUNFRC  --   --   --   --  0.18*  CREATININE 1.13*  --   --   --   --   CKTOTAL 201  --   --   --   --   TROPONINI  --  10.54* 4.10* 3.17*  --     Estimated Creatinine Clearance: 77.5 mL/min (by C-G formula based on SCr of 1.13 mg/dL (H)).  Assessment: 66 y/o female with NSTEMI for heparin   Goal of Therapy:  Heparin level 0.3-0.7 units/ml Monitor platelets by anticoagulation protocol: Yes   Plan:  Heparin 2000 units IV bolus, then increase heparin 1750 units/hr Check heparin level in 6 hours.   Phillis Knack, PharmD, BCPS  04/25/2016 3:25 AM

## 2016-04-25 NOTE — Progress Notes (Signed)
Progress Note  Patient Name: Jillian Eaton Date of Encounter: 04/25/2016  Primary Cardiologist: Dr [name]  Subjective   Intubated/sedated - no hx obtainable  Inpatient Medications    Scheduled Meds: . aspirin EC  81 mg Oral Daily  . ceFEPime (MAXIPIME) IV  1 g Intravenous Q8H  . chlorhexidine gluconate (MEDLINE KIT)  15 mL Mouth Rinse BID  . famotidine  20 mg Per Tube BID  . furosemide  40 mg Intravenous Once  . levETIRAcetam  750 mg Intravenous BID  . [START ON 04/26/2016] levothyroxine  100 mcg Per Tube QAC breakfast  . mouth rinse  15 mL Mouth Rinse 10 times per day  . potassium chloride  40 mEq Per Tube Once  . vancomycin  1,250 mg Intravenous Q24H   Continuous Infusions: . sodium chloride 250 mL (04/25/16 1006)  . feeding supplement (VITAL HIGH PROTEIN) 1,000 mL (04/25/16 0800)  . heparin 1,750 Units/hr (04/25/16 1003)  . propofol (DIPRIVAN) infusion 25 mcg/kg/min (04/25/16 1022)   PRN Meds: acetaminophen (TYLENOL) oral liquid 160 mg/5 mL, albuterol, fentaNYL (SUBLIMAZE) injection   Vital Signs    Vitals:   04/25/16 0800 04/25/16 0809 04/25/16 0830 04/25/16 1016  BP: 116/72 116/72 117/74 111/70  Pulse: 74 (!) 102 83 84  Resp: (!) 22 (!) 24 (!) 27 19  Temp: 99.3 F (37.4 C)  99.3 F (37.4 C)   TempSrc:      SpO2: 100% 97% 98% 96%  Weight:      Height:        Intake/Output Summary (Last 24 hours) at 04/25/16 1022 Last data filed at 04/25/16 0800  Gross per 24 hour  Intake          3544.25 ml  Output             2260 ml  Net          1284.25 ml   Filed Weights   04/24/16 1800 04/24/16 2000 04/25/16 0500  Weight: (!) 326 lb 1 oz (147.9 kg) (!) 320 lb (145.2 kg) (!) 325 lb (147.4 kg)    Telemetry    Sinus rhythm, episodes of AF with RVR - Personally Reviewed  ECG    NSR, prolonged QT, T wave abnormality consider lateral ischemia versus metabolic abnormality - Personally Reviewed  Physical Exam  Morbidly obese, intubated and sedated. Opens eyes  to voice GEN: No acute distress.   Neck: No JVD Cardiac: RRR, no murmurs, rubs, or gallops.  Respiratory: Clear to auscultation bilaterally. GI: Soft, nontender, non-distended  MS: diffuse 1+ edema; No deformity. Neuro:  Nonfocal  Psych: Normal affect   Labs    Chemistry Recent Labs Lab 04/23/16 2001 04/25/16 0250  NA 137 140  K 3.2* 2.9*  CL 98* 105  CO2 24 27  GLUCOSE 126* 112*  BUN 11 24*  CREATININE 1.13* 1.14*  CALCIUM 8.2* 7.1*  PROT 7.5  --   ALBUMIN 3.8  --   AST 34  --   ALT 14  --   ALKPHOS 90  --   BILITOT 0.9  --   GFRNONAA 51* 50*  GFRAA 59* 58*  ANIONGAP 15 8     Hematology Recent Labs Lab 04/23/16 2001 04/25/16 0250  WBC 14.6* 10.5  RBC 4.47 3.38*  HGB 14.3 10.8*  HCT 43.4 32.9*  MCV 97.1 97.3  MCH 32.0 32.0  MCHC 32.9 32.8  RDW 14.3 14.7  PLT 160 118*    Cardiac Enzymes Recent Labs Lab 04/24/16  0233 04/24/16 1826 04/25/16 0000  TROPONINI 10.54* 4.10* 3.17*    Recent Labs Lab 04/23/16 2013  TROPIPOC 0.87*     BNPNo results for input(s): BNP, PROBNP in the last 168 hours.   DDimer No results for input(s): DDIMER in the last 168 hours.   Radiology    Ct Head Wo Contrast  Result Date: 04/23/2016 CLINICAL DATA:  Confusion and history of hypertension. EXAM: CT HEAD WITHOUT CONTRAST TECHNIQUE: Contiguous axial images were obtained from the base of the skull through the vertex without intravenous contrast. COMPARISON:  06/10/2015 FINDINGS: Brain: No mass lesion, hemorrhage, hydrocephalus, acute infarct, intra-axial, or extra-axial fluid collection. Vascular: No hyperdense vessel or unexpected calcification. Skull: Normal Sinuses/Orbits: Endotracheal and nasogastric tubes are incompletely imaged. Fluid in the oropharynx likely secondary. Normal orbits and globes. Minimal fluid in the sphenoid sinus. Clear mastoid air cells. Other: None IMPRESSION: 1.  No acute intracranial abnormality. 2. Sinus disease. Electronically Signed   By: Abigail Miyamoto M.D.   On: 04/23/2016 22:51   Dg Chest Port 1 View  Result Date: 04/25/2016 CLINICAL DATA:  Patient admitted 04/23/2016 for seizure and altered mental status. Intubated. EXAM: PORTABLE CHEST 1 VIEW COMPARISON:  04/24/2016 and 04/23/2016. FINDINGS: Support tubes and lines are unchanged. Right worse than left basilar airspace disease is again seen. Aeration in the right lung base is worse than on yesterday's study. Patchy airspace disease in the right mid and upper lung zones is unchanged. There is cardiomegaly. No pneumothorax. IMPRESSION: Support tubes and lines projecting good position. Right greater than left airspace disease has worsened in the right lower lung zone and could be due to atelectasis, aspiration pneumonia. Electronically Signed   By: Inge Rise M.D.   On: 04/25/2016 07:31   Dg Chest Port 1 View  Result Date: 04/24/2016 CLINICAL DATA:  Central line placement EXAM: PORTABLE CHEST 1 VIEW COMPARISON:  04/23/2016 FINDINGS: Endotracheal tube with the tip 6.3 cm above the carina. Nasogastric tube coursing below the diaphragm. Interval placement of a left jugular central venous catheter with the tip projecting over the SVC. Bilateral interstitial and alveolar airspace opacities. More focal right upper lobe airspace disease. No pneumothorax. No pleural effusion. Stable cardiomegaly. No acute osseous abnormality. IMPRESSION: 1. Interval placement of a left jugular central venous catheter with the tip projecting over the SVC. 2. Bilateral patchy interstitial and alveolar airspace opacities more focal in the right upper lobe. Differential considerations include pulmonary edema versus right upper lobe pneumonia. Electronically Signed   By: Kathreen Devoid   On: 04/24/2016 18:30   Dg Chest Port 1 View  Result Date: 04/23/2016 CLINICAL DATA:  Seizure with unresponsiveness EXAM: PORTABLE CHEST 1 VIEW COMPARISON:  08/03/2015 CXR FINDINGS: AP portable semi upright view of the chest was provided.  The patient is slightly rotated. There is stable cardiomegaly with uncoiling of the thoracic aorta. Endotracheal tube tip is satisfactory at 5.1 cm above the carina. A gastric tube is seen extending towards the diaphragm. The tip is excluded. Patchy airspace opacities are seen in the right upper lobe suspicious for aspiration. Minimal bibasilar opacities may reflect areas of atelectasis and/or infiltrate as well. Osteoarthritis of the visualized glenohumeral joints. Skin fold artifact projects over the right lateral humeral head. IMPRESSION: 1. Cardiomegaly with bilateral airspace opacities predominantly in the right upper lobe with lesser degree of atelectasis and/or minimal infiltrate in both lung bases. Findings may reflect aspiration pneumonia. 2. Satisfactory endotracheal tube tip position approximately 5.1 cm above the carina. A gastric  tube is incompletely visualized but appears to extend below the diaphragm. Electronically Signed   By: Ashley Royalty M.D.   On: 04/23/2016 20:07   Dg Abd Portable 1v  Result Date: 04/24/2016 CLINICAL DATA:  Evaluate orogastric tube placement EXAM: PORTABLE ABDOMEN - 1 VIEW COMPARISON:  June 08, 2013 FINDINGS: The OG tube terminates with the side-port just below the GE junction and the distal tip just within the stomach. IMPRESSION: The side port and distal tip of the OG tube are just below the GE junction in the upper stomach. Electronically Signed   By: Dorise Bullion III M.D   On: 04/24/2016 12:03    Cardiac Studies   Trop 10.54 --->4.10 ---> 3.17  Echo: Study Conclusions  - Left ventricle: Diffuse hypokinesis worse in the inferior wall   The cavity size was mildly dilated. Wall thickness was normal.   Systolic function was severely reduced. The estimated ejection   fraction was in the range of 25% to 30%. Doppler parameters are   consistent with both elevated ventricular end-diastolic filling   pressure and elevated left atrial filling pressure. - Mitral  valve: There was mild regurgitation. - Left atrium: The atrium was mildly dilated. - Atrial septum: No defect or patent foramen ovale was identified.  Patient Profile     66 y.o. female with hypoxic respiratory arrest, out-of-hospital  Assessment & Plan    1. Acute systolic CHF 2. NSTEMI (troponin elevation without report of chest pain and without ST elevation) 3. Acute respiratory failure (VDRF) 4. Paroxysmal atrial fibrillation  The patient remains on the ventilator. She is currently sedated. Her heart rhythm has settled down but she is still having some paroxysms of atrial fibrillation. She seems to be tolerating them hemodynamically and is currently not requiring any vasopressors. We will continue to follow with you. I would continue IV heparin while she is in the ICU setting and will consider oral anticoagulation depending on her recovery.  Deatra James, MD  04/25/2016, 10:22 AM

## 2016-04-25 NOTE — Progress Notes (Signed)
Neurology Progress Note  Subjective: No major 24 hour events. No further seizure activity reported. She is following commands well. She is able to answer questions by nodding and shaking her head and indicates no particular complaints on 10-pt ROS. Her daughter and grandson are present.  Medications reviewed and reconciled.   Pertinent meds: Propofol drip Fentanyl 100 mcg q2h prn Keppra 750 mg bid Cefepime 1 g q8h  Current Meds:   Current Facility-Administered Medications:  .  0.9 %  sodium chloride infusion, 250 mL, Intravenous, Continuous, Chesley Mires, MD, Last Rate: 10 mL/hr at 04/25/16 1800, 250 mL at 04/25/16 1800 .  acetaminophen (TYLENOL) solution 650 mg, 650 mg, Per Tube, Q6H PRN, Collene Gobble, MD .  albuterol (PROVENTIL) (2.5 MG/3ML) 0.083% nebulizer solution 2.5 mg, 2.5 mg, Nebulization, Q2H PRN, Chesley Mires, MD .  aspirin EC tablet 81 mg, 81 mg, Oral, Daily, Chesley Mires, MD, 81 mg at 04/25/16 1053 .  ceFEPIme (MAXIPIME) 1 g in dextrose 5 % 50 mL IVPB, 1 g, Intravenous, Q8H, Jessica B Millen, RPH, 1 g at 04/25/16 1259 .  chlorhexidine gluconate (MEDLINE KIT) (PERIDEX) 0.12 % solution 15 mL, 15 mL, Mouth Rinse, BID, Praveen Mannam, MD, 15 mL at 04/25/16 0748 .  famotidine (PEPCID) 40 MG/5ML suspension 20 mg, 20 mg, Per Tube, BID, Dannielle Burn, MD, 20 mg at 04/25/16 1053 .  feeding supplement (VITAL HIGH PROTEIN) liquid 1,000 mL, 1,000 mL, Per Tube, Continuous, Chesley Mires, MD, Last Rate: 75 mL/hr at 04/25/16 1825, 1,000 mL at 04/25/16 1825 .  fentaNYL (SUBLIMAZE) injection 100 mcg, 100 mcg, Intravenous, Q2H PRN, Dannielle Burn, MD, 100 mcg at 04/25/16 1002 .  heparin ADULT infusion 100 units/mL (25000 units/27m sodium chloride 0.45%), 1,750 Units/hr, Intravenous, Continuous, Praveen Mannam, MD, Last Rate: 17.5 mL/hr at 04/25/16 1800, 1,750 Units/hr at 04/25/16 1800 .  levETIRAcetam (KEPPRA) 750 mg in sodium chloride 0.9 % 100 mL IVPB, 750 mg, Intravenous, BID, EKerney Elbe MD, 750 mg at 04/25/16 1054 .  [START ON 04/26/2016] levothyroxine (SYNTHROID, LEVOTHROID) tablet 100 mcg, 100 mcg, Per Tube, QAC breakfast, VChesley Mires MD .  MEDLINE mouth rinse, 15 mL, Mouth Rinse, 10 times per day, Praveen Mannam, MD, 15 mL at 04/25/16 1800 .  propofol (DIPRIVAN) 1000 MG/100ML infusion, 0-50 mcg/kg/min, Intravenous, Continuous, Praveen Mannam, MD, Last Rate: 35.4 mL/hr at 04/25/16 1829, 40 mcg/kg/min at 04/25/16 1829  Objective:  Temp:  [97.5 F (36.4 C)-100.2 F (37.9 C)] 97.5 F (36.4 C) (03/06 1611) Pulse Rate:  [70-114] 78 (03/06 1800) Resp:  [7-35] 23 (03/06 1800) BP: (89-124)/(60-95) 97/63 (03/06 1800) SpO2:  [93 %-100 %] 96 % (03/06 1800) FiO2 (%):  [40 %] 40 % (03/06 1520) Weight:  [145.2 kg (320 lb)-147.4 kg (325 lb)] 147.4 kg (325 lb) (03/06 0500)  General: WDWN AA woman lying in ICU bed in NAD. She is intubated. She is alert and follows midline and appendicular commands briskly. She answers questions appropriately by nodding and shaking her head.   HEENT: Neck is supple without lymphadenopathy. ETT and OGT in place. Sclerae are anicteric. There is no conjunctival injection.  CV: Regular, no murmur. Carotid pulses are 2+ and symmetric with no bruits. Distal pulses 2+ and symmetric.  Lungs: CTAB on anterior exam. Ventilated.   Neuro: MS: As noted above.  CN: Pupils are equal and reactive from 3-->2 mm bilaterally. EOMI, no nystagmus. She has some breakup of smooth pursuits in all directions. Corneals are intact. Face is symmetric at rest  with normal strength and mobility though the lower portion is partly obscured by tubes and tape. Hearing is intact to conversational voice. Bilateral SCM and trapezii are 5/5. Tongue protrudes midline. The remainder of her cranial nerves cannot be accurately assessed as she is intubated. Motor: Normal bulk, tone, and strength throughout. No tremor or other abnormal movements are observed.  Sensation: Intact to light  touch. DTRs: 2+, symmetric in the arms, absent in the legs. Toes are mute bilaterally. No pathological reflexes.  Coordination: Finger-to-nose without dysmetria bilaterally.    Labs: Lab Results  Component Value Date   WBC 10.5 04/25/2016   HGB 10.8 (L) 04/25/2016   HCT 32.9 (L) 04/25/2016   PLT 118 (L) 04/25/2016   GLUCOSE 112 (H) 04/25/2016   TRIG 64 04/23/2016   ALT 14 04/23/2016   AST 34 04/23/2016   NA 140 04/25/2016   K 2.9 (L) 04/25/2016   CL 105 04/25/2016   CREATININE 1.14 (H) 04/25/2016   BUN 24 (H) 04/25/2016   CO2 27 04/25/2016   TSH 9.239 (H) 04/25/2016   CBC Latest Ref Rng & Units 04/25/2016 04/23/2016 03/12/2016  WBC 4.0 - 10.5 K/uL 10.5 14.6(H) 4.7  Hemoglobin 12.0 - 15.0 g/dL 10.8(L) 14.3 11.0(L)  Hematocrit 36.0 - 46.0 % 32.9(L) 43.4 34.4(L)  Platelets 150 - 400 K/uL 118(L) 160 95(L)    No results found for: HGBA1C Lab Results  Component Value Date   ALT 14 04/23/2016   AST 34 04/23/2016   ALKPHOS 90 04/23/2016   BILITOT 0.9 04/23/2016    Radiology:  I have personally and independently reviewed the [facility] without contrast from 04/23/16. This shows moderate chronic small vessel ischemic disease, no acute abnormality.   Other diagnostic studies: EEG 04/24/16: moderate diffuse generalized slowing with artifact at P3.   A/P:   1. Seizure: She had probable seizure at the time of presentation. This was likely provoked by infection/sepsis and possibly by hypomagnesemia as well. No further seizures since admission. Continue Keppra through acute illness then taper as she does not likely require longterm AED therapy in setting of provoked seizure. Continue seizure precautions. Continue to optimize electrolyte status. Wean from vent as tolerated, will defer to PCCM. No need for ongoing sedation or vent support from neurology perspective.   This was discussed with the patient's daughter at the bedside. She is in agreement with the plan as noted. She was given the opportunity  to ask questions and these were addressed to her satisfaction.    Melba Coon, MD [facility]

## 2016-04-25 NOTE — Progress Notes (Signed)
PCCM Progress Note  Admission date: 04/23/2016 Referring provider: Dr. Jarold Song  CC: Seizure  HPI: 66 yo female brought to ER with seizure and altered mental status.  Intubated for airway protection.  She has PMHx of chronic pain, fibromyalgia, hypothyroidism, diastolic CHF, asthma.  She was febrile on admission with CXR concerning for aspiration.  Subjective: In and out of A fib.  Vital signs: BP 117/74   Pulse 83   Temp 99.3 F (37.4 C)   Resp (!) 27   Ht 5\' 8"  (1.727 m)   Wt (!) 325 lb (147.4 kg)   SpO2 98%   BMI 49.42 kg/m   Intake/output: I/O last 3 completed shifts: In: 3982.2 [I.V.:1882.2; Other:60; NG/GT:1525; IV [id number] Out: 2385 [Urine:2085; Stool:300]  General: more alert Neuro: opens eyes with stimulation, no following commands HEENT: ETT in place Cardiac: regular, no murmur Chest: faint b/l crackles Abd: soft, non tender Ext: 1+ edema Skin: no rashes   CMP Latest Ref Rng & Units 04/25/2016 04/23/2016 03/12/2016  Glucose 65 - 99 mg/dL 112(H) 126(H) 81  BUN 6 - 20 mg/dL 24(H) 11 8  Creatinine 0.44 - 1.00 mg/dL 1.14(H) 1.13(H) 0.96  Sodium 135 - 145 mmol/L 140 137 144  Potassium 3.5 - 5.1 mmol/L 2.9(L) 3.2(L) 4.0  Chloride 101 - 111 mmol/L 105 98(L) 104  CO2 22 - 32 mmol/L 27 24 35(H)  Calcium 8.9 - 10.3 mg/dL 7.1(L) 8.2(L) 6.7(L)  Total Protein 6.5 - 8.1 g/dL - 7.5 -  Total Bilirubin 0.3 - 1.2 mg/dL - 0.9 -  Alkaline Phos 38 - 126 U/L - 90 -  AST 15 - 41 U/L - 34 -  ALT 14 - 54 U/L - 14 -     CBC Latest Ref Rng & Units 04/25/2016 04/23/2016 03/12/2016  WBC 4.0 - 10.5 K/uL 10.5 14.6(H) 4.7  Hemoglobin 12.0 - 15.0 g/dL 10.8(L) 14.3 11.0(L)  Hematocrit 36.0 - 46.0 % 32.9(L) 43.4 34.4(L)  Platelets 150 - 400 K/uL 118(L) 160 95(L)     ABG    Component Value Date/Time   PHART 7.405 04/23/2016 2038   PCO2ART 46.2 04/23/2016 2038   PO2ART 266.0 (H) 04/23/2016 2038   HCO3 28.9 (H) 04/23/2016 2038   TCO2 30 04/23/2016 2038   O2SAT 100.0 04/23/2016 2038      CBG (last 3)   Recent Labs  04/25/16 0002 04/25/16 0357 04/25/16 0822  GLUCAP 108* 74 106*     Imaging: Ct Head Wo Contrast  Result Date: 04/23/2016 CLINICAL DATA:  Confusion and history of hypertension. EXAM: CT HEAD WITHOUT CONTRAST TECHNIQUE: Contiguous axial images were obtained from the base of the skull through the vertex without intravenous contrast. COMPARISON:  06/10/2015 FINDINGS: Brain: No mass lesion, hemorrhage, hydrocephalus, acute infarct, intra-axial, or extra-axial fluid collection. Vascular: No hyperdense vessel or unexpected calcification. Skull: Normal Sinuses/Orbits: Endotracheal and nasogastric tubes are incompletely imaged. Fluid in the oropharynx likely secondary. Normal orbits and globes. Minimal fluid in the sphenoid sinus. Clear mastoid air cells. Other: None IMPRESSION: 1.  No acute intracranial abnormality. 2. Sinus disease. Electronically Signed   By: Abigail Miyamoto M.D.   On: 04/23/2016 22:51   Dg Chest Port 1 View  Result Date: 04/25/2016 CLINICAL DATA:  Patient admitted 04/23/2016 for seizure and altered mental status. Intubated. EXAM: PORTABLE CHEST 1 VIEW COMPARISON:  04/24/2016 and 04/23/2016. FINDINGS: Support tubes and lines are unchanged. Right worse than left basilar airspace disease is again seen. Aeration in the right lung base is worse than on  yesterday's study. Patchy airspace disease in the right mid and upper lung zones is unchanged. There is cardiomegaly. No pneumothorax. IMPRESSION: Support tubes and lines projecting good position. Right greater than left airspace disease has worsened in the right lower lung zone and could be due to atelectasis, aspiration pneumonia. Electronically Signed   By: Inge Rise M.D.   On: 04/25/2016 07:31   Dg Chest Port 1 View  Result Date: 04/24/2016 CLINICAL DATA:  Central line placement EXAM: PORTABLE CHEST 1 VIEW COMPARISON:  04/23/2016 FINDINGS: Endotracheal tube with the tip 6.3 cm above the carina.  Nasogastric tube coursing below the diaphragm. Interval placement of a left jugular central venous catheter with the tip projecting over the SVC. Bilateral interstitial and alveolar airspace opacities. More focal right upper lobe airspace disease. No pneumothorax. No pleural effusion. Stable cardiomegaly. No acute osseous abnormality. IMPRESSION: 1. Interval placement of a left jugular central venous catheter with the tip projecting over the SVC. 2. Bilateral patchy interstitial and alveolar airspace opacities more focal in the right upper lobe. Differential considerations include pulmonary edema versus right upper lobe pneumonia. Electronically Signed   By: Kathreen Devoid   On: 04/24/2016 18:30   Dg Chest Port 1 View  Result Date: 04/23/2016 CLINICAL DATA:  Seizure with unresponsiveness EXAM: PORTABLE CHEST 1 VIEW COMPARISON:  08/03/2015 CXR FINDINGS: AP portable semi upright view of the chest was provided. The patient is slightly rotated. There is stable cardiomegaly with uncoiling of the thoracic aorta. Endotracheal tube tip is satisfactory at 5.1 cm above the carina. A gastric tube is seen extending towards the diaphragm. The tip is excluded. Patchy airspace opacities are seen in the right upper lobe suspicious for aspiration. Minimal bibasilar opacities may reflect areas of atelectasis and/or infiltrate as well. Osteoarthritis of the visualized glenohumeral joints. Skin fold artifact projects over the right lateral humeral head. IMPRESSION: 1. Cardiomegaly with bilateral airspace opacities predominantly in the right upper lobe with lesser degree of atelectasis and/or minimal infiltrate in both lung bases. Findings may reflect aspiration pneumonia. 2. Satisfactory endotracheal tube tip position approximately 5.1 cm above the carina. A gastric tube is incompletely visualized but appears to extend below the diaphragm. Electronically Signed   By: Ashley Royalty M.D.   On: 04/23/2016 20:07   Dg Abd Portable  1v  Result Date: 04/24/2016 CLINICAL DATA:  Evaluate orogastric tube placement EXAM: PORTABLE ABDOMEN - 1 VIEW COMPARISON:  June 08, 2013 FINDINGS: The OG tube terminates with the side-port just below the GE junction and the distal tip just within the stomach. IMPRESSION: The side port and distal tip of the OG tube are just below the GE junction in the upper stomach. Electronically Signed   By: Dorise Bullion III M.D   On: 04/24/2016 12:03     Studies: CT head 3/04 >> negative  Echo 3/05 >> EF 25 to 30%, mild MR, mild LA dilation EEG 3/05 >> diffuse slowing, rhythmic sharp waves Lt parietal region  Antibiotics: Vancomycin 3/04 >> Azactam 3/04 >> 3/05  Levaquin 3/04 >> 3/05  Cefepime 3/05 >>   Cultures: Blood 3/04 >> Urine 3/04 >> negative Influenza 3/04 >> negative C diff 3/04 >> negative  Lines/tubes: ETT 3/04 >> Lt IJ CVL 3/05 >>  Events: 3/04 Admit 3/05 Elevated troponin, A fib >> cardiology consulted, started heparin gtt  Summary: 66 yo female with acute encephalopathy, seizure, fever, aspiration PNA, VDRF.  Assessment/plan:  Acute encephalopathy with seizure. Hx of chronic pain. - monitor mental status -  RASS goal 0 to -1 - AEDs per neurology  Acute hypoxic respiratory failure 2nd to aspiration and compromised airway. Hx of asthma, vocal cord dysfunction. - pressure support wean >> mental status barrier to extubation trial - f/u CXR - prn BDs  Elevated troponin from demand ischemia. Acute systolic CHF. Intermittent a fib with RVR. - continue ASA - lasix 40 mg IV x one 3/06 - continue heparin gtt  Aspiration pneumonia. - day 3 of Abx  Hypokalemia. - replace as needed  Anemia of critical illness and chronic disease. Thrombocytopenia >> mild. - f/u CBC  Hx of hypothyroidism. - f/u TSH - resume synthroid 3/06 >> start at 100 mcg and increase as needed  DVT prophylaxis - heparin gtt SUP - Pepcid Nutrition - tube feeds Goals of care - full  code  CC time 34 minutes  Updated pt's niece at bedside (works as Therapist, sports)  Chesley Mires, MD [place] 04/25/2016, 8:58 AM Pager:  [phone] After 3pm call: [phone]

## 2016-04-25 NOTE — Progress Notes (Signed)
Hypoglycemic Event  CBG: 66  Treatment: D50 IV 50 mL  Symptoms: None  Follow-up CBG: Time:2100 CBG Result:89  Possible Reasons for Event: Inadequate meal intake  Comments/MD notified: [facility] RN notified    Devona Konig

## 2016-04-26 ENCOUNTER — Inpatient Hospital Stay (HOSPITAL_COMMUNITY): Payer: Federal, State, Local not specified - PPO

## 2016-04-26 LAB — GLUCOSE, CAPILLARY
GLUCOSE-CAPILLARY: 75 mg/dL (ref 65–99)
GLUCOSE-CAPILLARY: 90 mg/dL (ref 65–99)
GLUCOSE-CAPILLARY: 84 mg/dL (ref 65–99)
Glucose-Capillary: 90 mg/dL (ref 65–99)
Glucose-Capillary: 61 mg/dL — ABNORMAL LOW (ref 65–99)
Glucose-Capillary: 79 mg/dL (ref 65–99)
Glucose-Capillary: 99 mg/dL (ref 65–99)

## 2016-04-26 LAB — BASIC METABOLIC PANEL
Anion gap: 5 (ref 5–15)
BUN: 25 mg/dL — AB (ref 6–20)
CALCIUM: 7.2 mg/dL — AB (ref 8.9–10.3)
CHLORIDE: 107 mmol/L (ref 101–111)
CO2: 30 mmol/L (ref 22–32)
CREATININE: 0.89 mg/dL (ref 0.44–1.00)
GFR calc non Af Amer: >60 mL/min (ref >60)
GLUCOSE: 94 mg/dL (ref 65–99)
Potassium: 3.5 mmol/L (ref 3.5–5.1)
Sodium: 142 mmol/L (ref 135–145)

## 2016-04-26 LAB — HEPARIN LEVEL (UNFRACTIONATED): Heparin Unfractionated: 0.43 IU/mL (ref 0.30–0.70)

## 2016-04-26 LAB — CBC
HEMATOCRIT: 30.2 % — AB (ref 36.0–46.0)
Hemoglobin: 9.6 g/dL — ABNORMAL LOW (ref 12.0–15.0)
MCH: 31.1 pg (ref 26.0–34.0)
MCHC: 31.8 g/dL (ref 30.0–36.0)
MCV: 97.7 fL (ref 78.0–100.0)
PLATELETS: 125 10*3/uL — AB (ref 150–400)
RBC: 3.09 MIL/uL — ABNORMAL LOW (ref 3.87–5.11)
RDW: 14.5 % (ref 11.5–15.5)
WBC: 7.6 10*3/uL (ref 4.0–10.5)

## 2016-04-26 MED ORDER — FAMOTIDINE IN NACL 20-0.9 MG/50ML-% IV SOLN
20.0000 mg | Freq: Two times a day (BID) | INTRAVENOUS | Status: DC
  Administered 2016-04-26 – 2016-04-27 (×3): 20 mg via INTRAVENOUS
  Filled 2016-04-26 (×3): qty 50

## 2016-04-26 MED ORDER — LEVOTHYROXINE SODIUM 100 MCG IV SOLR
50.0000 ug | Freq: Every day | INTRAVENOUS | Status: DC
  Administered 2016-04-27: 50 ug via INTRAVENOUS
  Filled 2016-04-26: qty 5

## 2016-04-26 MED ORDER — ASPIRIN 81 MG PO CHEW
CHEWABLE_TABLET | ORAL | Status: AC
  Filled 2016-04-26: qty 1

## 2016-04-26 MED ORDER — POTASSIUM CHLORIDE 20 MEQ/15ML (10%) PO SOLN
20.0000 meq | ORAL | Status: AC
  Administered 2016-04-26 (×2): 20 meq
  Filled 2016-04-26 (×2): qty 15

## 2016-04-26 MED ORDER — POTASSIUM CHLORIDE 20 MEQ/15ML (10%) PO SOLN
ORAL | Status: AC
  Administered 2016-04-26: 20 meq
  Filled 2016-04-26: qty 15

## 2016-04-26 MED ORDER — FUROSEMIDE 10 MG/ML IJ SOLN
40.0000 mg | Freq: Once | INTRAMUSCULAR | Status: AC
  Administered 2016-04-26: 40 mg via INTRAVENOUS
  Filled 2016-04-26: qty 4

## 2016-04-26 MED ORDER — DEXTROSE 5 % IV SOLN
2.0000 g | Freq: Three times a day (TID) | INTRAVENOUS | Status: AC
  Administered 2016-04-26 – 2016-04-30 (×12): 2 g via INTRAVENOUS
  Filled 2016-04-26 (×13): qty 2

## 2016-04-26 MED ORDER — DEXTROSE 50 % IV SOLN
INTRAVENOUS | Status: AC
  Administered 2016-04-26: 50 mL
  Filled 2016-04-26: qty 50

## 2016-04-26 MED ORDER — ORAL CARE MOUTH RINSE
15.0000 mL | Freq: Two times a day (BID) | OROMUCOSAL | Status: DC
  Administered 2016-04-26 – 2016-05-05 (×15): 15 mL via OROMUCOSAL

## 2016-04-26 MED ORDER — DEXTROSE 250 MG/ML IV SOLN
25.0000 mg | Freq: Once | INTRAVENOUS | Status: DC
  Filled 2016-04-26: qty 10

## 2016-04-26 MED ORDER — DEXTROSE-NACL 5-0.9 % IV SOLN
INTRAVENOUS | Status: DC
  Administered 2016-04-26: 13:00:00 via INTRAVENOUS

## 2016-04-26 NOTE — Progress Notes (Signed)
Pharmacy Antibiotic Note  Jillian Eaton is a 66 y.o. female admitted on 04/23/2016 with aspiration pneumonia.  Pharmacy has been consulted for change Cefepime to [facility] dosing. Total antibiotic day #4.   Plan: After discussion with Dr. Halford Chessman, antibiotics will be changed Fortaz 2g IV every 8 hours for 4 days to complete total of 7 days of therapy.  Monitor renal function, culture results and clinical status.   Height: 5\' 8"  (172.7 cm) Weight: (!) 318 lb (144.2 kg) IBW/kg (Calculated) : 63.9  Temp (24hrs), Avg:98 F (36.7 C), Min:97.5 F (36.4 C), Max:98.5 F (36.9 C)   Recent Labs Lab 04/23/16 2001 04/23/16 2015 04/25/16 0250 04/26/16 0422  WBC 14.6*  --  10.5 7.6  CREATININE 1.13*  --  1.14* 0.89  LATICACIDVEN  --  1.51  --   --     Estimated Creatinine Clearance: 98.1 mL/min (by C-G formula based on SCr of 0.89 mg/dL).    Allergies  Allergen Reactions  . Penicillins Other (See Comments)    intolerance    Antimicrobials this admission: Aztreonam 3/4>>3/5 Levoflox 3/4>>3/5 Cefepime 3/5 >>3/7 Vancomycin 3/4>>3/6 Ceftazidime 3/7 >>  Dose adjustments this admission:  Microbiology results: 3/4 BCx: ngtd x2d 3/4 CDiff: negative 3/4 urine: negative 3/4 MRSA pcr negative 3/4 Influenza negative  Thank you for allowing pharmacy to be a part of this patient's care.  Sloan Leiter, PharmD, BCPS Clinical Pharmacist Clinical phone 04/26/2016 until 3:30 PM - [phone] After hours, please call #[phone] 04/26/2016 12:01 PM

## 2016-04-26 NOTE — Progress Notes (Signed)
Hypoglycemic Event  CBG: 61  Treatment: D50 IV 50 mL  Symptoms: None  Follow-up CBG: Time:1309 CBG Result:79  Possible Reasons for Event: Unknown  Comments/MD notified:Yes    Bobby Rumpf, Shira Bobst E

## 2016-04-26 NOTE — Procedures (Signed)
Extubation Procedure Note  Patient Details:   Name: Jillian Eaton DOB: [birth year] MRN: [id number]   Airway Documentation:     Evaluation  O2 sats: stable throughout Complications: No apparent complications Patient did tolerate procedure well. Bilateral Breath Sounds: Clear   Yes   Pt extubated to [facility] per MD order. Pt stable throughout. VS within normal limits. Bilateral clear diminished BS. Positive cuff leak noted. No stridor. Pt able to speak and cough. RN aware. RT will continue to monitor.   Jesse Sans 04/26/2016, 2:50 PM

## 2016-04-26 NOTE — Progress Notes (Signed)
PCCM Progress Note  Admission date: 04/23/2016 Referring provider: Dr. Jarold Song  CC: Seizure  HPI: 66 yo female brought to ER with seizure and altered mental status.  Intubated for airway protection.  She has PMHx of chronic pain, fibromyalgia, hypothyroidism, diastolic CHF, asthma.  She was febrile on admission with CXR concerning for aspiration.  Subjective: Tolerating pressure support.  Vital signs: BP 104/78   Pulse 71   Temp 98.1 F (36.7 C) (Oral)   Resp (!) 27   Ht 5\' 8"  (1.727 m)   Wt (!) 318 lb (144.2 kg)   SpO2 98%   BMI 48.35 kg/m   Intake/output: I/O last 3 completed shifts: In: 5579.3 [I.V.:2344.3; NG/GT:2820; IV Piggyback:415] Out: 8850 [Urine:4911; Stool:500]  General: sedated Neuro: RASS -2 HEENT: ETT in place Cardiac: regular, no murmur Chest: better air movement Abd: soft, non tender Ext: 1+ edema Skin: no rashes   CMP Latest Ref Rng & Units 04/26/2016 04/25/2016 04/23/2016  Glucose 65 - 99 mg/dL 94 112(H) 126(H)  BUN 6 - 20 mg/dL 25(H) 24(H) 11  Creatinine 0.44 - 1.00 mg/dL 0.89 1.14(H) 1.13(H)  Sodium 135 - 145 mmol/L 142 140 137  Potassium 3.5 - 5.1 mmol/L 3.5 2.9(L) 3.2(L)  Chloride 101 - 111 mmol/L 107 105 98(L)  CO2 22 - 32 mmol/L 30 27 24   Calcium 8.9 - 10.3 mg/dL 7.2(L) 7.1(L) 8.2(L)  Total Protein 6.5 - 8.1 g/dL - - 7.5  Total Bilirubin 0.3 - 1.2 mg/dL - - 0.9  Alkaline Phos 38 - 126 U/L - - 90  AST 15 - 41 U/L - - 34  ALT 14 - 54 U/L - - 14     CBC Latest Ref Rng & Units 04/26/2016 04/25/2016 04/23/2016  WBC 4.0 - 10.5 K/uL 7.6 10.5 14.6(H)  Hemoglobin 12.0 - 15.0 g/dL 9.6(L) 10.8(L) 14.3  Hematocrit 36.0 - 46.0 % 30.2(L) 32.9(L) 43.4  Platelets 150 - 400 K/uL 125(L) 118(L) 160     ABG    Component Value Date/Time   PHART 7.405 04/23/2016 2038   PCO2ART 46.2 04/23/2016 2038   PO2ART 266.0 (H) 04/23/2016 2038   HCO3 28.9 (H) 04/23/2016 2038   TCO2 30 04/23/2016 2038   O2SAT 100.0 04/23/2016 2038     CBG (last 3)   Recent Labs  04/25/16 2335 04/26/16 0343 04/26/16 0804  GLUCAP 85 90 75    Lab Results  Component Value Date   TSH 9.239 (H) 04/25/2016    Imaging: Dg Chest Port 1 View  Result Date: 04/26/2016 CLINICAL DATA:  Respiratory failure EXAM: PORTABLE CHEST 1 VIEW COMPARISON:  04/25/2016 FINDINGS: Cardiac shadow is again enlarged in size. Endotracheal tube, nasogastric catheter and left-sided central venous line are again seen and stable. The left lung remains clear. Patchy infiltrative changes are again seen on the right with some mild increase in basilar atelectasis. No bony abnormality is noted. IMPRESSION: Patchy changes on the right with increase in basilar atelectasis. Electronically Signed   By: Inez Catalina M.D.   On: 04/26/2016 07:18   Dg Chest Port 1 View  Result Date: 04/25/2016 CLINICAL DATA:  Patient admitted 04/23/2016 for seizure and altered mental status. Intubated. EXAM: PORTABLE CHEST 1 VIEW COMPARISON:  04/24/2016 and 04/23/2016. FINDINGS: Support tubes and lines are unchanged. Right worse than left basilar airspace disease is again seen. Aeration in the right lung base is worse than on yesterday's study. Patchy airspace disease in the right mid and upper lung zones is unchanged. There is cardiomegaly. No pneumothorax.  IMPRESSION: Support tubes and lines projecting good position. Right greater than left airspace disease has worsened in the right lower lung zone and could be due to atelectasis, aspiration pneumonia. Electronically Signed   By: Inge Rise M.D.   On: 04/25/2016 07:31   Dg Chest Port 1 View  Result Date: 04/24/2016 CLINICAL DATA:  Central line placement EXAM: PORTABLE CHEST 1 VIEW COMPARISON:  04/23/2016 FINDINGS: Endotracheal tube with the tip 6.3 cm above the carina. Nasogastric tube coursing below the diaphragm. Interval placement of a left jugular central venous catheter with the tip projecting over the SVC. Bilateral interstitial and alveolar airspace opacities. More focal  right upper lobe airspace disease. No pneumothorax. No pleural effusion. Stable cardiomegaly. No acute osseous abnormality. IMPRESSION: 1. Interval placement of a left jugular central venous catheter with the tip projecting over the SVC. 2. Bilateral patchy interstitial and alveolar airspace opacities more focal in the right upper lobe. Differential considerations include pulmonary edema versus right upper lobe pneumonia. Electronically Signed   By: Kathreen Devoid   On: 04/24/2016 18:30   Dg Abd Portable 1v  Result Date: 04/24/2016 CLINICAL DATA:  Evaluate orogastric tube placement EXAM: PORTABLE ABDOMEN - 1 VIEW COMPARISON:  June 08, 2013 FINDINGS: The OG tube terminates with the side-port just below the GE junction and the distal tip just within the stomach. IMPRESSION: The side port and distal tip of the OG tube are just below the GE junction in the upper stomach. Electronically Signed   By: Dorise Bullion III M.D   On: 04/24/2016 12:03     Studies: CT head 3/04 >> negative  Echo 3/05 >> EF 25 to 30%, mild MR, mild LA dilation EEG 3/05 >> diffuse slowing, rhythmic sharp waves Lt parietal region  Antibiotics: Vancomycin 3/04 >> Azactam 3/04 >> 3/05  Levaquin 3/04 >> 3/05  Cefepime 3/05 >>   Cultures: Blood 3/04 >> Urine 3/04 >> negative Influenza 3/04 >> negative C diff 3/04 >> negative  Lines/tubes: ETT 3/04 >> Lt IJ CVL 3/05 >>  Events: 3/04 Admit 3/05 Elevated troponin, A fib >> cardiology consulted, started heparin gtt  Summary: 66 yo female with acute encephalopathy, seizure, fever, aspiration PNA, VDRF.  Assessment/plan:  Acute encephalopathy with seizure. Hx of chronic pain. - neuro sign off 3/07 >> recommend weaning off keppra after acute illness resolved  Acute hypoxic respiratory failure 2nd to aspiration and compromised airway. Hx of asthma, vocal cord dysfunction. - pressure support wean >> might be ready for extubation trial soon - prn BDs  Elevated  troponin from demand ischemia. Acute systolic CHF. Intermittent a fib with RVR. - continue ASA - lasix 40 mg IV x one 3/07 - continue heparin gtt  Aspiration pneumonia. - day 4 of Abx  Hypokalemia. - replace as needed  Anemia of critical illness and chronic disease. Thrombocytopenia >> mild. - f/u CBC  Hx of hypothyroidism. - resumed synthroid 3/06 >> slowly titrate up to home dose  DVT prophylaxis - heparin gtt SUP - Pepcid Nutrition - tube feeds Goals of care - full code  CC time 32 minutes  Updated pt's husband at bedside.  Chesley Mires, MD [facility] Pulmonary/Critical Care 04/26/2016, 11:52 AM Pager:  [phone] After 3pm call: [phone]

## 2016-04-26 NOTE — Progress Notes (Signed)
ANTICOAGULATION CONSULT NOTE - Follow Up Consult  Pharmacy Consult for heparin Indication: atrial fibrillation and NSTEMI  Allergies  Allergen Reactions  . Penicillins Other (See Comments)    intolerance    Patient Measurements: Height: 5\' 8"  (172.7 cm) Weight: (!) 318 lb (144.2 kg) IBW/kg (Calculated) : 63.9 Heparin Dosing Weight: 100 kg  Vital Signs: Temp: 98.1 F (36.7 C) (03/07 0806) Temp Source: Oral (03/07 0806) BP: 110/84 (03/07 0600) Pulse Rate: 65 (03/07 0600)  Labs:  Recent Labs  04/23/16 2001  04/24/16 1826 04/25/16 0000  04/25/16 0250 04/25/16 1051 04/25/16 1804 04/26/16 0422  HGB 14.3  --   --   --   --  10.8*  --   --  9.6*  HCT 43.4  --   --   --   --  32.9*  --   --  30.2*  PLT 160  --   --   --   --  118*  --   --  125*  HEPARINUNFRC  --   --   --   --   < > 0.18* 0.58 0.48 0.43  CREATININE 1.13*  --   --   --   --  1.14*  --   --  0.89  CKTOTAL 201  --   --   --   --   --   --   --   --   TROPONINI  --   < > 4.10* 3.17*  --   --  2.03*  --   --   < > = values in this interval not displayed.  Estimated Creatinine Clearance: 98.1 mL/min (by C-G formula based on SCr of 0.89 mg/dL).   Medications:  Infusions:  . sodium chloride 250 mL (04/25/16 1800)  . feeding supplement (VITAL HIGH PROTEIN) 1,000 mL (04/26/16 0601)  . heparin 1,750 Units/hr (04/26/16 0129)  . propofol (DIPRIVAN) infusion 40 mcg/kg/min (04/26/16 0756)    Assessment: 66 y/o female with rising troponin/NSTEMI started on a heparin drip. Cardiology consulted and telemetry showed Afib. Heparin level is therapeutic at 0.43 on 1750 units/hr. No bleeding noted.  Goal of Therapy:  Heparin level 0.3-0.7 units/ml Monitor platelets by anticoagulation protocol: Yes   Plan:  - Continue heparin drip at 1750 units/hr - Daily heparin level and CBC - Monitor for s/sx of bleeding - Follow-up duration of therapy  Sloan Leiter, PharmD, BCPS Clinical Pharmacist Clinical phone 04/26/2016  until 3:30 PM - [phone] After hours, please call #[phone] 04/26/2016 8:17 AM

## 2016-04-26 NOTE — Care Management Note (Signed)
Case Management Note  Patient Details  Name: Jillian Eaton MRN: [id number] Date of Birth: February 17, 1951  Subjective/Objective:  Pt admitted with AMS - intubated                   Action/Plan:  From home with husband - no family in room.  CM will continue to follow for discharge needs   Expected Discharge Date:                  Expected Discharge Plan:     In-House Referral:     Discharge planning Services  CM Consult  Post Acute Care Choice:    Choice offered to:     DME Arranged:    DME Agency:     HH Arranged:    HH Agency:     Status of Service:  In process, will continue to follow  If discussed at Long Length of Stay Meetings, dates discussed:    Additional Comments:  Maryclare Labrador, RN 04/26/2016, 2:52 PM

## 2016-04-26 NOTE — Progress Notes (Signed)
[facility] ADULT ICU REPLACEMENT PROTOCOL FOR AM LAB REPLACEMENT ONLY  The patient does apply for the [facility] Adult ICU Electrolyte Replacment Protocol based on the criteria listed below:   1. Is GFR >/= 40 ml/min? Yes.    Patient's GFR today is > 2. Is urine output >/= 0.5 ml/kg/hr for the last 6 hours? Yes.   Patient's UOP is 0.87 ml/kg/hr 3. Is BUN < 60 mg/dL? Yes.    Patient's BUN today is 25 4. Abnormal electrolyte(s): Potassium 3.5 5. Ordered repletion with: Potassium per protocol 6. If a panic level lab has been reported, has the CCM MD in charge been notified? No..   Physician:    Adam Phenix 04/26/2016 5:46 AM

## 2016-04-26 NOTE — Progress Notes (Signed)
Neurology Progress Note  Subjective: She has some mild hypoglycemia last night (BG 66) which was treated with 50 mL of D50 with f/u CBG 89. No other significant 24 hour events. She remains intubated and is sedated with propofol. No further seizure activity reported. Sedation limits exam and ROS.   Medications reviewed and reconciled.   Pertinent meds: Propofol drip 40 mcg/kg/min Fentanyl 100 mcg q2h prn Keppra 750 mg bid Cefepime 1 g q8h  Current Meds:   Current Facility-Administered Medications:  .  0.9 %  sodium chloride infusion, 250 mL, Intravenous, Continuous, Chesley Mires, MD, Last Rate: 10 mL/hr at 04/26/16 0800, 250 mL at 04/26/16 0800 .  acetaminophen (TYLENOL) solution 650 mg, 650 mg, Per Tube, Q6H PRN, Collene Gobble, MD .  albuterol (PROVENTIL) (2.5 MG/3ML) 0.083% nebulizer solution 2.5 mg, 2.5 mg, Nebulization, Q2H PRN, Chesley Mires, MD .  aspirin EC tablet 81 mg, 81 mg, Oral, Daily, Chesley Mires, MD, 81 mg at 04/26/16 0908 .  ceFEPIme (MAXIPIME) 1 g in dextrose 5 % 50 mL IVPB, 1 g, Intravenous, Q8H, Priscella Mann, RPH, 1 g at 04/26/16 0418 .  chlorhexidine gluconate (MEDLINE KIT) (PERIDEX) 0.12 % solution 15 mL, 15 mL, Mouth Rinse, BID, Praveen Mannam, MD, 15 mL at 04/26/16 0805 .  famotidine (PEPCID) 40 MG/5ML suspension 20 mg, 20 mg, Per Tube, BID, Dannielle Burn, MD, 20 mg at 04/26/16 0908 .  feeding supplement (VITAL HIGH PROTEIN) liquid 1,000 mL, 1,000 mL, Per Tube, Continuous, Chesley Mires, MD, Last Rate: 75 mL/hr at 04/26/16 0800, 1,000 mL at 04/26/16 0800 .  fentaNYL (SUBLIMAZE) injection 100 mcg, 100 mcg, Intravenous, Q2H PRN, Dannielle Burn, MD, 100 mcg at 04/25/16 1002 .  heparin ADULT infusion 100 units/mL (25000 units/263m sodium chloride 0.45%), 1,750 Units/hr, Intravenous, Continuous, Praveen Mannam, MD, Last Rate: 17.5 mL/hr at 04/26/16 0800, 1,750 Units/hr at 04/26/16 0800 .  levETIRAcetam (KEPPRA) 750 mg in sodium chloride 0.9 % 100 mL IVPB, 750  mg, Intravenous, BID, EKerney Elbe MD, 750 mg at 04/26/16 0908 .  levothyroxine (SYNTHROID, LEVOTHROID) tablet 100 mcg, 100 mcg, Per Tube, QAC breakfast, VChesley Mires MD, 100 mcg at 04/26/16 0805 .  MEDLINE mouth rinse, 15 mL, Mouth Rinse, 10 times per day, Praveen Mannam, MD, 15 mL at 04/26/16 1000 .  potassium chloride 20 MEQ/15ML (10%) solution, , , ,  .  propofol (DIPRIVAN) 1000 MG/100ML infusion, 0-50 mcg/kg/min, Intravenous, Continuous, Praveen Mannam, MD, Last Rate: 35.4 mL/hr at 04/26/16 0800, 40.027 mcg/kg/min at 04/26/16 0800  Objective:  Temp:  [97.5 F (36.4 C)-98.5 F (36.9 C)] 98.1 F (36.7 C) (03/07 0806) Pulse Rate:  [65-113] 66 (03/07 0830) Resp:  [0-35] 22 (03/07 0830) BP: (84-115)/(56-95) 102/72 (03/07 0830) SpO2:  [91 %-100 %] 97 % (03/07 0830) FiO2 (%):  [40 %] 40 % (03/07 0800) Weight:  [144.2 kg (318 lb)] 144.2 kg (318 lb) (03/07 0410)  General: WDWN AA woman lying in ICU bed in NAD. She is intubated and sedated. She opens her eyes to voice and will nod/shake her head appropriately to some questions but sustained attention is limited. She is able to follow some simple commands but this is inconsistent.   HEENT: Neck is supple without lymphadenopathy. ETT and OGT in place. Sclerae are anicteric. There is no conjunctival injection.  CV: Regular, no murmur. Carotid pulses are 2+ and symmetric with no bruits. Distal pulses 2+ and symmetric.  Lungs: CTAB on anterior exam. Ventilated.   Neuro: MS: As noted above.  CN: Pupils are equal and reactive from 3-->2 mm bilaterally. EOMI, no nystagmus. Eyes are conjugate. Corneals are intact. Face is symmetric at rest with normal strength and mobility though the lower portion is partly obscured by tubes and tape. Hearing is intact to conversational voice. The remainder of her cranial nerves cannot be accurately assessed as she is intubated. Motor: Normal bulk, tone. She has poor cooperation with strength testing with limited  spontaneous movement of the limbs observed. No tremor or other abnormal movements are observed.  Sensation: Withdraws from minimal noxious stimulation x4.  DTRs: 2+, symmetric in the arms, absent in the legs. Toes are mute bilaterally. No pathological reflexes.  Coordination: Unable to assess as the patient does not participate with the exam.     Labs: Lab Results  Component Value Date   WBC 7.6 04/26/2016   HGB 9.6 (L) 04/26/2016   HCT 30.2 (L) 04/26/2016   PLT 125 (L) 04/26/2016   GLUCOSE 94 04/26/2016   TRIG 64 04/23/2016   ALT 14 04/23/2016   AST 34 04/23/2016   NA 142 04/26/2016   K 3.5 04/26/2016   CL 107 04/26/2016   CREATININE 0.89 04/26/2016   BUN 25 (H) 04/26/2016   CO2 30 04/26/2016   TSH 9.239 (H) 04/25/2016   CBC Latest Ref Rng & Units 04/26/2016 04/25/2016 04/23/2016  WBC 4.0 - 10.5 K/uL 7.6 10.5 14.6(H)  Hemoglobin 12.0 - 15.0 g/dL 9.6(L) 10.8(L) 14.3  Hematocrit 36.0 - 46.0 % 30.2(L) 32.9(L) 43.4  Platelets 150 - 400 K/uL 125(L) 118(L) 160    No results found for: HGBA1C Lab Results  Component Value Date   ALT 14 04/23/2016   AST 34 04/23/2016   ALKPHOS 90 04/23/2016   BILITOT 0.9 04/23/2016   TSH 9.239  Radiology:  There is no new neuroimaging for review.    A/P:   1. Seizure: She had probable seizure at the time of presentation. This was likely provoked by infection/sepsis +/- hypomagnesemia. 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 No further seizures since admission. Continue Keppra through acute illness then taper as she does not likely require longterm AED therapy in setting of provoked seizure. Continue seizure precautions. Continue to optimize electrolyte status. Wean from vent as tolerated, will defer to PCCM. No need for ongoing sedation or vent support from neurology  perspective. Recommend routine outpatient f/u with neurology after discharge.   This was discussed with the patient's niece at the bedside. She is in agreement with the plan as noted. She was given the opportunity to ask questions and these were addressed to her satisfaction.   At this time I have no additional recommendations and will sign off. Please call if any new issues arise.    Melba Coon, MD [facility]

## 2016-04-27 DIAGNOSIS — J9602 Acute respiratory failure with hypercapnia: Secondary | ICD-10-CM

## 2016-04-27 LAB — GLUCOSE, CAPILLARY
GLUCOSE-CAPILLARY: 94 mg/dL (ref 65–99)
GLUCOSE-CAPILLARY: 78 mg/dL (ref 65–99)
Glucose-Capillary: 89 mg/dL (ref 65–99)
Glucose-Capillary: 83 mg/dL (ref 65–99)

## 2016-04-27 LAB — BASIC METABOLIC PANEL
Anion gap: 11 (ref 5–15)
BUN: 19 mg/dL (ref 6–20)
CHLORIDE: 103 mmol/L (ref 101–111)
CO2: 31 mmol/L (ref 22–32)
CREATININE: 0.92 mg/dL (ref 0.44–1.00)
Calcium: 7.7 mg/dL — ABNORMAL LOW (ref 8.9–10.3)
GFR calc Af Amer: >60 mL/min (ref >60)
GFR calc non Af Amer: >60 mL/min (ref >60)
Glucose, Bld: 92 mg/dL (ref 65–99)
Potassium: 3.6 mmol/L (ref 3.5–5.1)
Sodium: 145 mmol/L (ref 135–145)

## 2016-04-27 LAB — HEPARIN LEVEL (UNFRACTIONATED): Heparin Unfractionated: 0.48 IU/mL (ref 0.30–0.70)

## 2016-04-27 LAB — CBC
HEMATOCRIT: 35 % — AB (ref 36.0–46.0)
Hemoglobin: 11.1 g/dL — ABNORMAL LOW (ref 12.0–15.0)
MCH: 31.2 pg (ref 26.0–34.0)
MCHC: 31.7 g/dL (ref 30.0–36.0)
MCV: 98.3 fL (ref 78.0–100.0)
Platelets: 174 10*3/uL (ref 150–400)
RBC: 3.56 MIL/uL — ABNORMAL LOW (ref 3.87–5.11)
RDW: 14.3 % (ref 11.5–15.5)
WBC: 7.7 10*3/uL (ref 4.0–10.5)

## 2016-04-27 MED ORDER — HEPARIN SODIUM (PORCINE) 5000 UNIT/ML IJ SOLN
5000.0000 [IU] | Freq: Three times a day (TID) | INTRAMUSCULAR | Status: DC
  Administered 2016-04-28 – 2016-05-05 (×22): 5000 [IU] via SUBCUTANEOUS
  Filled 2016-04-27 (×24): qty 1

## 2016-04-27 MED ORDER — FUROSEMIDE 40 MG PO TABS
40.0000 mg | ORAL_TABLET | Freq: Every day | ORAL | Status: DC
  Administered 2016-04-27 – 2016-05-05 (×9): 40 mg via ORAL
  Filled 2016-04-27 (×9): qty 1

## 2016-04-27 MED ORDER — ACETAMINOPHEN 325 MG PO TABS
650.0000 mg | ORAL_TABLET | Freq: Four times a day (QID) | ORAL | Status: DC | PRN
  Administered 2016-04-29 – 2016-05-05 (×19): 650 mg via ORAL
  Filled 2016-04-27 (×20): qty 2

## 2016-04-27 MED ORDER — LEVETIRACETAM 750 MG PO TABS
750.0000 mg | ORAL_TABLET | Freq: Two times a day (BID) | ORAL | Status: DC
  Administered 2016-04-27 – 2016-05-02 (×10): 750 mg via ORAL
  Filled 2016-04-27 (×10): qty 1

## 2016-04-27 MED ORDER — LOSARTAN POTASSIUM 50 MG PO TABS
50.0000 mg | ORAL_TABLET | Freq: Every day | ORAL | Status: DC
  Administered 2016-04-27 – 2016-04-30 (×4): 50 mg via ORAL
  Filled 2016-04-27 (×5): qty 1

## 2016-04-27 MED ORDER — PANTOPRAZOLE SODIUM 40 MG PO TBEC
40.0000 mg | DELAYED_RELEASE_TABLET | Freq: Every day | ORAL | Status: DC
  Administered 2016-04-27 – 2016-05-04 (×8): 40 mg via ORAL
  Filled 2016-04-27 (×8): qty 1

## 2016-04-27 MED ORDER — ALBUTEROL SULFATE HFA 108 (90 BASE) MCG/ACT IN AERS: 2.0000 | INHALATION_SPRAY | Freq: Four times a day (QID) | RESPIRATORY_TRACT | Status: DC | PRN

## 2016-04-27 MED ORDER — POTASSIUM CHLORIDE CRYS ER 20 MEQ PO TBCR
20.0000 meq | EXTENDED_RELEASE_TABLET | Freq: Every day | ORAL | Status: DC
  Administered 2016-04-27 – 2016-04-28 (×2): 20 meq via ORAL
  Filled 2016-04-27 (×2): qty 1

## 2016-04-27 MED ORDER — SODIUM CHLORIDE 0.9 % IV SOLN: INTRAVENOUS | Status: DC | PRN

## 2016-04-27 MED ORDER — LEVOTHYROXINE SODIUM 75 MCG PO TABS
150.0000 ug | ORAL_TABLET | Freq: Every day | ORAL | Status: DC
  Administered 2016-04-28 – 2016-05-05 (×8): 150 ug via ORAL
  Filled 2016-04-27 (×4): qty 2
  Filled 2016-04-27: qty 1
  Filled 2016-04-27 (×4): qty 2

## 2016-04-27 NOTE — Progress Notes (Signed)
PCCM Progress Note  Admission date: 04/23/2016 Referring provider: Dr. Jarold Song  CC: Seizure  HPI: 66 yo female brought to ER with seizure and altered mental status.  Intubated for airway protection.  She has PMHx of chronic pain, fibromyalgia, hypothyroidism, diastolic CHF, asthma.  She was febrile on admission with CXR concerning for aspiration.  Subjective: Did well overnight.  Vital signs: BP 124/82   Pulse 74   Temp 98.6 F (37 C) (Oral)   Resp (!) 23   Ht 5\' 8"  (1.727 m)   Wt (!) 319 lb (144.7 kg)   SpO2 93%   BMI 48.50 kg/m   Intake/output: I/O last 3 completed shifts: In: 3806.8 [I.V.:1792.3; NG/GT:1450; IV Piggyback:564.5] Out: 6213 [YQMVH:8469; Stool:175]  General: sleeping Neuro: follows commands HEENT: no stridor Cardiac: regular, no murmur Chest: no wheeze Abd: soft, non tender Ext: 1+ edema Skin: no rashes   CMP Latest Ref Rng & Units 04/27/2016 04/26/2016 04/25/2016  Glucose 65 - 99 mg/dL 92 94 112(H)  BUN 6 - 20 mg/dL 19 25(H) 24(H)  Creatinine 0.44 - 1.00 mg/dL 0.92 0.89 1.14(H)  Sodium 135 - 145 mmol/L 145 142 140  Potassium 3.5 - 5.1 mmol/L 3.6 3.5 2.9(L)  Chloride 101 - 111 mmol/L 103 107 105  CO2 22 - 32 mmol/L 31 30 27   Calcium 8.9 - 10.3 mg/dL 7.7(L) 7.2(L) 7.1(L)  Total Protein 6.5 - 8.1 g/dL - - -  Total Bilirubin 0.3 - 1.2 mg/dL - - -  Alkaline Phos 38 - 126 U/L - - -  AST 15 - 41 U/L - - -  ALT 14 - 54 U/L - - -     CBC Latest Ref Rng & Units 04/27/2016 04/26/2016 04/25/2016  WBC 4.0 - 10.5 K/uL 7.7 7.6 10.5  Hemoglobin 12.0 - 15.0 g/dL 11.1(L) 9.6(L) 10.8(L)  Hematocrit 36.0 - 46.0 % 35.0(L) 30.2(L) 32.9(L)  Platelets 150 - 400 K/uL 174 125(L) 118(L)     ABG    Component Value Date/Time   PHART 7.405 04/23/2016 2038   PCO2ART 46.2 04/23/2016 2038   PO2ART 266.0 (H) 04/23/2016 2038   HCO3 28.9 (H) 04/23/2016 2038   TCO2 30 04/23/2016 2038   O2SAT 100.0 04/23/2016 2038     CBG (last 3)   Recent Labs  04/26/16 2322 04/27/16 0310  04/27/16 0827  GLUCAP 99 89 94    Lab Results  Component Value Date   TSH 9.239 (H) 04/25/2016    Imaging: Dg Chest Port 1 View  Result Date: 04/26/2016 CLINICAL DATA:  Respiratory failure EXAM: PORTABLE CHEST 1 VIEW COMPARISON:  04/25/2016 FINDINGS: Cardiac shadow is again enlarged in size. Endotracheal tube, nasogastric catheter and left-sided central venous line are again seen and stable. The left lung remains clear. Patchy infiltrative changes are again seen on the right with some mild increase in basilar atelectasis. No bony abnormality is noted. IMPRESSION: Patchy changes on the right with increase in basilar atelectasis. Electronically Signed   By: Inez Catalina M.D.   On: 04/26/2016 07:18     Studies: CT head 3/04 >> negative  Echo 3/05 >> EF 25 to 30%, mild MR, mild LA dilation EEG 3/05 >> diffuse slowing, rhythmic sharp waves Lt parietal region  Antibiotics: Vancomycin 3/04 >> Azactam 3/04 >> 3/05  Levaquin 3/04 >> 3/05  Cefepime 3/05 >> 3/07 Tressie Ellis 3/07 >>  Cultures: Blood 3/04 >> Urine 3/04 >> negative Influenza 3/04 >> negative C diff 3/04 >> negative  Lines/tubes: ETT 3/04 >> 3/07 Lt IJ  CVL 3/05 >> 3/08  Events: 3/04 Admit 3/05 Elevated troponin, A fib >> cardiology consulted, started heparin gtt 3/08 transfer to telemetry  Summary: 66 yo female with acute encephalopathy, seizure, fever, aspiration PNA, VDRF.  Assessment/plan:  Acute encephalopathy with seizure. Hx of chronic pain. - neuro sign off 3/07 >> recommend weaning off keppra after acute illness resolved  Acute hypoxic respiratory failure 2nd to aspiration and compromised airway. Hx of asthma, vocal cord dysfunction. - oxygen to keep SpO2 > 92% - prn BDs  Elevated troponin from NSTEMI. Acute systolic CHF. Intermittent a fib with RVR. - continue ASA, lasix - per cardiology >> transition off heparin gtt to SQ heparin; not candidate for oral anticoagulation - medical management for  CAD  Aspiration pneumonia. - Day 5 of 7 of Abx  Hypokalemia. - replace as needed  Anemia of critical illness and chronic disease. Thrombocytopenia >> mild. - f/u CBC  Dysphagia. - f/u with speech therapy  Hx of hypothyroidism. - resumed synthroid 3/06 >> slowly titrate up to home dose  DVT prophylaxis - SQ heparin SUP - Pepcid Nutrition - D2 diet Goals of care - full code  Updated pt's daughter at bedside  Transfer to tele 3/08 >> to [facility] 3/09 and PCCM off  Time spent 36 minutes  Chesley Mires, MD [place] 04/27/2016, 10:51 AM Pager:  [phone] After 3pm call: [phone]

## 2016-04-27 NOTE — Evaluation (Signed)
Clinical/Bedside Swallow Evaluation Patient Details  Name: Jillian Eaton MRN: [id number] Date of Birth: 1950-10-14  Today's Date: 04/27/2016 Time: SLP Start Time (ACUTE ONLY): 0854 SLP Stop Time (ACUTE ONLY): 0913 SLP Time Calculation (min) (ACUTE ONLY): 19 min  Past Medical History:  Past Medical History:  Diagnosis Date  . Anemia   . Asthma   . Chronic headache   . DOE (dyspnea on exertion)    2D ECHO, 02/12/2012 - EF 60-65%, moderate concentric hypertrophy  . Fibromyalgia    nerve pain"left side at waist level" "can't lay on that side without pain" , "HOB elevation helps"  . Fibromyalgia   . Heart murmur   . Hematuria - cause not known   . History of kidney stones    x 2 '13, '14 surgery to remove  . Hypertension   . SOB (shortness of breath)    NUCLEAR STRESS TEST, 03/02/2009 - no ischemic ST changes or symptoms  . Thyroid disease    "goiter"  . Transfusion history    10 yrs+  . Urinary incontinence, functional    Past Surgical History:  Past Surgical History:  Procedure Laterality Date  . CARDIAC CATHETERIZATION  04/04/2010   No significant obstructive coronary artery disease  . CHOLECYSTECTOMY  1990  . COLONOSCOPY W/ POLYPECTOMY    . COLONOSCOPY WITH PROPOFOL N/A 04/10/2014   Procedure: COLONOSCOPY WITH PROPOFOL;  Surgeon: Theda Belfast, MD;  Location: [facility];  Service: Endoscopy;  Laterality: N/A;  . DIAGNOSTIC LAPAROSCOPY     x2 bowel obstructions(adhesions)  . 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 THYROIDECTOMY    . TUBAL LIGATION  1986   HPI:  66 yo female with acute encephalopathy, seizure (likely provoked by infection/sepsis +/-hypomagnesemia), fever, VDRF. Intubated for airway protection 3/4-3/7. CXR post extubation noted possible aspiration PNA.  She was recently admitted in January 2018 for an unintentional narcotic overdose and hypoxemic/hypercapneic respiratory failure. She was discharged 1/21. PMH of  asthma, vocal cord dysfunction.    Assessment / Plan / Recommendation Clinical Impression  Patient presents with a mild oral dysphagia characterized by oral holding and delayed oral transit due to lethargy and impaired sustained attention. With cues for clearance of bolus and liquid wash, patient able to clear oral cavity effectively. No overt s/s of aspiration noted across consistencies. Daughter present and supportive. Educated regarding general aspiration precautions given 3 day intubation and potential to advance solids with improved mentation. WIll f/u.   SLP Visit Diagnosis: Dysphagia, oral phase (R13.11)    Aspiration Risk  Mild aspiration risk    Diet Recommendation Dysphagia 2 (Fine chop);Thin liquid   Liquid Administration via: Cup;Straw Medication Administration: Whole meds with liquid Supervision: Patient able to self feed;Full supervision/cueing for compensatory strategies Compensations: Slow rate;Small sips/bites;Lingual sweep for clearance of pocketing Postural Changes: Seated upright at 90 degrees    Other  Recommendations Oral Care Recommendations: Oral care BID   Follow up Recommendations None      Frequency and Duration min 2x/week  1 week       Prognosis Prognosis for Safe Diet Advancement: Good Barriers to Reach Goals: Cognitive deficits      Swallow Study   General HPI: 66 yo female with acute encephalopathy, seizure (likely provoked by infection/sepsis +/-hypomagnesemia), fever, VDRF. Intubated for airway protection 3/4-3/7. CXR post extubation noted possible aspiration PNA.  She was recently admitted in January 2018 for an unintentional narcotic overdose and hypoxemic/hypercapneic respiratory  failure. She was discharged 1/21. PMH of asthma, vocal cord dysfunction.  Type of Study: Bedside Swallow Evaluation Previous Swallow Assessment: none noted Diet Prior to this Study: NPO Temperature Spikes Noted: No Respiratory Status: Nasal cannula History of  Recent Intubation: Yes Length of Intubations (days): 3 days Date extubated: 04/26/16 Behavior/Cognition: Alert;Cooperative;Pleasant mood Oral Cavity Assessment: Within Functional Limits Oral Care Completed by SLP: Recent completion by staff Oral Cavity - Dentition: Adequate natural dentition;Missing dentition Vision: Functional for self-feeding Self-Feeding Abilities: Able to feed self;Needs assist Patient Positioning: Upright in bed Baseline Vocal Quality: Low vocal intensity Volitional Cough: Weak (confused however) Volitional Swallow: Able to elicit    Oral/Motor/Sensory Function Overall Oral Motor/Sensory Function: Generalized oral weakness   Ice Chips Ice chips: Within functional limits Presentation: Spoon   Thin Liquid Thin Liquid: Within functional limits Presentation: Cup;Straw    Nectar Thick Nectar Thick Liquid: Not tested   Honey Thick Honey Thick Liquid: Not tested   Puree Puree: Within functional limits Presentation: Spoon   Solid   GO   Solid: Impaired Oral Phase Impairments: Impaired mastication Oral Phase Functional Implications: Prolonged oral transit;Oral holding       Ferdinand Lango MA, CCC-SLP [phone]  Jillian Eaton Jillian Eaton 04/27/2016,9:22 AM

## 2016-04-27 NOTE — Progress Notes (Addendum)
Progress Note  Patient Name: Jillian Eaton Date of Encounter: 04/27/2016  Primary Cardiologist: Dr [name]  Subjective   Patient is extubated. She is comfortable and states breathing is okay. No chest pain.  Inpatient Medications    Scheduled Meds: . aspirin EC  81 mg Oral Daily  . cefTAZidime (FORTAZ)  IV  2 g Intravenous Q8H  . dextrose  25 mg Intravenous Once  . famotidine (PEPCID) IV  20 mg Intravenous Q12H  . levETIRAcetam  750 mg Intravenous BID  . levothyroxine  50 mcg Intravenous Daily  . mouth rinse  15 mL Mouth Rinse BID   Continuous Infusions: . dextrose 5 % and 0.9% NaCl 30 mL/hr at 04/26/16 1800  . heparin 1,750 Units/hr (04/26/16 2000)   PRN Meds: acetaminophen (TYLENOL) oral liquid 160 mg/5 mL, albuterol   Vital Signs    Vitals:   04/26/16 1800 04/26/16 1929 04/26/16 2320 04/27/16 0308  BP: 112/62     Pulse: 76     Resp: (!) 21     Temp:  98.5 F (36.9 C) 98.1 F (36.7 C) 98.3 F (36.8 C)  TempSrc:  Oral Oral Oral  SpO2: 95%     Weight:      Height:        Intake/Output Summary (Last 24 hours) at 04/27/16 0705 Last data filed at 04/27/16 8563  Gross per 24 hour  Intake          1907.04 ml  Output             6000 ml  Net         -4092.96 ml   Filed Weights   04/24/16 2000 04/25/16 0500 04/26/16 0410  Weight: (!) 320 lb (145.2 kg) (!) 325 lb (147.4 kg) (!) 318 lb (144.2 kg)    Telemetry    Sinus rhythm, there are periods of AV dissociation likely complete heart block, but ventricular escape is narrow complex and there is no bradycardia - Personally Reviewed   Physical Exam  Pleasant, morbidly obese woman in no distress GEN: No acute distress.   Neck: No JVD Cardiac: RRR, no murmurs, rubs, or gallops.  Respiratory: Clear to auscultation bilaterally. GI: Soft, nontender, non-distended  MS: trace edema; No deformity. Neuro:  Nonfocal  Psych: Normal affect   Labs    Chemistry Recent Labs Lab 04/23/16 2001 04/25/16 0250  04/26/16 0422 04/27/16 0345  NA 137 140 142 145  K 3.2* 2.9* 3.5 3.6  CL 98* 105 107 103  CO2 24 27 30 31   GLUCOSE 126* 112* 94 92  BUN 11 24* 25* 19  CREATININE 1.13* 1.14* 0.89 0.92  CALCIUM 8.2* 7.1* 7.2* 7.7*  PROT 7.5  --   --   --   ALBUMIN 3.8  --   --   --   AST 34  --   --   --   ALT 14  --   --   --   ALKPHOS 90  --   --   --   BILITOT 0.9  --   --   --   GFRNONAA 51* 50* >60 >60  GFRAA 59* 58* >60 >60  ANIONGAP 15 8 5 11      Hematology Recent Labs Lab 04/25/16 0250 04/26/16 0422 04/27/16 0345  WBC 10.5 7.6 7.7  RBC 3.38* 3.09* 3.56*  HGB 10.8* 9.6* 11.1*  HCT 32.9* 30.2* 35.0*  MCV 97.3 97.7 98.3  MCH 32.0 31.1 31.2  MCHC 32.8 31.8 31.7  RDW 14.7 14.5 14.3  PLT 118* 125* 174    Cardiac Enzymes Recent Labs Lab 04/24/16 0233 04/24/16 1826 04/25/16 0000 04/25/16 1051  TROPONINI 10.54* 4.10* 3.17* 2.03*    Recent Labs Lab 04/23/16 2013  TROPIPOC 0.87*     BNPNo results for input(s): BNP, PROBNP in the last 168 hours.   DDimer No results for input(s): DDIMER in the last 168 hours.   Radiology    Dg Chest Port 1 View  Result Date: 04/26/2016 CLINICAL DATA:  Respiratory failure EXAM: PORTABLE CHEST 1 VIEW COMPARISON:  04/25/2016 FINDINGS: Cardiac shadow is again enlarged in size. Endotracheal tube, nasogastric catheter and left-sided central venous line are again seen and stable. The left lung remains clear. Patchy infiltrative changes are again seen on the right with some mild increase in basilar atelectasis. No bony abnormality is noted. IMPRESSION: Patchy changes on the right with increase in basilar atelectasis. Electronically Signed   By: Inez Catalina M.D.   On: 04/26/2016 07:18    Cardiac Studies   Trop 10.54 --->4.10 ---> 3.17  Echo: Study Conclusions  - Left ventricle: Diffuse hypokinesis worse in the inferior wall The cavity size was mildly dilated. Wall thickness was normal. Systolic function was severely reduced. The  estimated ejection fraction was in the range of 25% to 30%. Doppler parameters are consistent with both elevated ventricular end-diastolic filling pressure and elevated left atrial filling pressure. - Mitral valve: There was mild regurgitation. - Left atrium: The atrium was mildly dilated. - Atrial septum: No defect or patent foramen ovale was identified.  Patient Profile     66 y.o. female with hypoxic respiratory arrest, out-of-hospital  Assessment & Plan    1. Acute systolic CHF - resume losartan. Avoid beta blocker in the setting of AV block. Start oral furosemide 40 mg daily.  2. NSTEMI (troponin elevation without report of chest pain and without ST elevation) - likely demand ischemia. Patient without chest pain. Noninvasive imaging is not feasible because of her body habitus. Recommend medical therapy.  3. Acute respiratory failure (VDRF) - improving. Management per CCM  4. Paroxysmal atrial fibrillation - only occurring during her acute respiratory failure. Maintaining sinus rhythm now. Recommend continue telemetry and would avoid oral anticoagulation unless she has recurrent atrial fibrillation. Will change heparin to DVT prophylaxis dose.  5. Complete heart block: narrow complex escape, no bradycardic event. Occurring during acute illness/respiratory failure. Continue to monitor. Avoid AV nodal blocking agents.   Deatra James, MD  04/27/2016, 7:05 AM

## 2016-04-27 NOTE — Progress Notes (Signed)
Pt transferred to 5W. Pt refused Heparin. She has questions about allergy/study about the medication. She wants to clarify with MD. Jillian Eaton note left for MD. Will continue to monitor.

## 2016-04-28 ENCOUNTER — Encounter (HOSPITAL_COMMUNITY): Payer: Self-pay | Admitting: Internal Medicine

## 2016-04-28 DIAGNOSIS — I214 Non-ST elevation (NSTEMI) myocardial infarction: Secondary | ICD-10-CM | POA: Diagnosis present

## 2016-04-28 DIAGNOSIS — I4891 Unspecified atrial fibrillation: Secondary | ICD-10-CM | POA: Diagnosis present

## 2016-04-28 DIAGNOSIS — J690 Pneumonitis due to inhalation of food and vomit: Secondary | ICD-10-CM | POA: Diagnosis present

## 2016-04-28 DIAGNOSIS — J383 Other diseases of vocal cords: Secondary | ICD-10-CM | POA: Diagnosis present

## 2016-04-28 DIAGNOSIS — I482 Chronic atrial fibrillation, unspecified: Secondary | ICD-10-CM | POA: Diagnosis present

## 2016-04-28 DIAGNOSIS — R778 Other specified abnormalities of plasma proteins: Secondary | ICD-10-CM | POA: Diagnosis present

## 2016-04-28 DIAGNOSIS — R7989 Other specified abnormal findings of blood chemistry: Secondary | ICD-10-CM

## 2016-04-28 DIAGNOSIS — D696 Thrombocytopenia, unspecified: Secondary | ICD-10-CM | POA: Diagnosis present

## 2016-04-28 LAB — BASIC METABOLIC PANEL
Anion gap: 12 (ref 5–15)
BUN: 16 mg/dL (ref 6–20)
CALCIUM: 8.7 mg/dL — AB (ref 8.9–10.3)
CHLORIDE: 101 mmol/L (ref 101–111)
CO2: 28 mmol/L (ref 22–32)
CREATININE: 0.95 mg/dL (ref 0.44–1.00)
Glucose, Bld: 121 mg/dL — ABNORMAL HIGH (ref 65–99)
Potassium: 3.6 mmol/L (ref 3.5–5.1)
SODIUM: 141 mmol/L (ref 135–145)

## 2016-04-28 LAB — CULTURE, BLOOD (ROUTINE X 2)
CULTURE: NO GROWTH
CULTURE: NO GROWTH

## 2016-04-28 LAB — CBC
HCT: 43.1 % (ref 36.0–46.0)
HEMOGLOBIN: 14.1 g/dL (ref 12.0–15.0)
MCH: 32.1 pg (ref 26.0–34.0)
MCHC: 32.7 g/dL (ref 30.0–36.0)
MCV: 98.2 fL (ref 78.0–100.0)
PLATELETS: 209 10*3/uL (ref 150–400)
RBC: 4.39 MIL/uL (ref 3.87–5.11)
RDW: 13.8 % (ref 11.5–15.5)
WBC: 8.5 10*3/uL (ref 4.0–10.5)

## 2016-04-28 MED ORDER — LEVALBUTEROL HCL 0.63 MG/3ML IN NEBU
0.6300 mg | INHALATION_SOLUTION | RESPIRATORY_TRACT | Status: DC | PRN
  Administered 2016-04-28 – 2016-05-02 (×5): 0.63 mg via RESPIRATORY_TRACT
  Filled 2016-04-28 (×5): qty 3

## 2016-04-28 MED ORDER — METOPROLOL TARTRATE 5 MG/5ML IV SOLN
INTRAVENOUS | Status: AC
  Filled 2016-04-28: qty 5

## 2016-04-28 MED ORDER — METOPROLOL TARTRATE 5 MG/5ML IV SOLN: 5.0000 mg | INTRAVENOUS | Status: DC | PRN

## 2016-04-28 NOTE — Progress Notes (Signed)
Unable to weigh pt as the bed was not zeroed. Pt's bed read 40lbs. Pt refused to be lifted via a lift for staff to zero the bed for a more accurate reading. Will continue to monitor.

## 2016-04-28 NOTE — Evaluation (Signed)
Occupational Therapy Evaluation Patient Details Name: Jillian Eaton MRN: [id number] DOB: [birth year] Today's Date: 04/28/2016    History of Present Illness Pt is a 66 y/o morbidly obese female who presents s/p seizure with encephalopathy, and aspiration PNA. Pt was intubated in the field (3/4) and extubated 3/7. PMH significant for HTN, asthma, fibromyalgia, heart murmur, DOE/SOB, urinary incontinence.    Clinical Impression   Pt admitted with above. She demonstrates the below listed deficits and will benefit from continued OT to maximize safety and independence with BADLs.  Pt presents with generalized weakness, impaired balance, decreased activity tolerance, and cognitive deficits.  She requires mod - total A for ADLs.  HR to 160 with EOB activity.   Recommend CIR.      Follow Up Recommendations  CIR;Supervision/Assistance - 24 hour    Equipment Recommendations  3 in 1 bedside commode    Recommendations for Other Services Rehab consult     Precautions / Restrictions Precautions Precautions: Fall Precaution Comments: Chronic B knee pain Restrictions Weight Bearing Restrictions: No      Mobility Bed Mobility Overal bed mobility: Needs Assistance Bed Mobility: Supine to Sit;Sit to Supine;Sit to Sidelying     Supine to sit: HOB elevated Sit to supine: Mod assist Sit to sidelying: Mod assist General bed mobility comments: Pt requires assist for LEs   Transfers Overall transfer level: Needs assistance Equipment used: None Transfers: Lateral/Scoot Transfers          Lateral/Scoot Transfers: +2 physical assistance;Max assist;Total assist General transfer comment: Not attempted due to fatigue     Balance Overall balance assessment: Needs assistance Sitting-balance support: Feet supported;No upper extremity supported Sitting balance-Leahy Scale: Fair Sitting balance - Comments: Able to sit EOB with supervision                                      ADL Overall ADL's : Needs assistance/impaired Eating/Feeding: Modified independent;Bed level   Grooming: Wash/dry hands;Wash/dry face;Oral care;Brushing hair;Sitting;Bed level;Set up;Min guard   Upper Body Bathing: Moderate assistance;Sitting;Bed level   Lower Body Bathing: Maximal assistance;Bed level   Upper Body Dressing : Maximal assistance;Sitting;Bed level   Lower Body Dressing: Total assistance;Bed level   Toilet Transfer: Total assistance   Toileting- Clothing Manipulation and Hygiene: Total assistance;Bed level       Functional mobility during ADLs: Maximal assistance;Total assistance General ADL Comments: HR to 160 with EOB activity      Vision Patient Visual Report: No change from baseline Additional Comments: pt able to use cell phone without difficulty      Perception     Praxis Praxis Praxis tested?: Within functional limits    Pertinent Vitals/Pain Pain Assessment: Faces Faces Pain Scale: Hurts whole lot Pain Location: Lower legs and B knees Pain Descriptors / Indicators: Sore Pain Intervention(s): Limited activity within patient's tolerance;Monitored during session;Repositioned     Hand Dominance     Extremity/Trunk Assessment Upper Extremity Assessment Upper Extremity Assessment: Generalized weakness (grossly 3+/5 - 4-/5)   Lower Extremity Assessment Lower Extremity Assessment: Defer to PT evaluation       Communication Communication Communication: No difficulties   Cognition Arousal/Alertness: Awake/alert Behavior During Therapy: WFL for tasks assessed/performed Overall Cognitive Status: Impaired/Different from baseline Area of Impairment: Memory;Safety/judgement;Awareness;Problem solving;Attention Orientation Level:  (Pt using cues from room for orientation questions. ) Current Attention Level: Selective Memory: Decreased short-term memory   Safety/Judgement: Decreased awareness of safety;Decreased awareness  of  deficits Awareness: Emergent;Intellectual Problem Solving: Slow processing;Decreased initiation;Difficulty sequencing;Requires verbal cues;Requires tactile cues General Comments: Pt slow to respond.  She reports she just now realized Trump was preseident.   Verbal cues to attend to task    General Comments  husband present     Exercises Exercises: Other exercises Other Exercises Other Exercises: Pt performed AAROM shoulder flex/ext x 2 sets of 10 while EOB    Shoulder Instructions      Home Living Family/patient expects to be discharged to:: Private residence Living Arrangements: Spouse/significant other Available Help at Discharge: Family;Available 24 hours/day Type of Home: House Home Access: Stairs to enter [employer] of Steps: 1 Entrance Stairs-Rails: None Home Layout: Two level;Bed/bath upstairs Alternate Level Stairs-Number of Steps: 7 Alternate Level Stairs-Rails: Right Bathroom Shower/Tub: Tub/shower unit;Curtain Shower/tub characteristics: Architectural technologist: Handicapped height     Home Equipment: Tub bench;Walker - 2 wheels;Bedside commode   Additional Comments: Difficulty getting upstairs to bedroom.      Prior Functioning/Environment Level of Independence: Independent with assistive device(s);Needs assistance  Gait / Transfers Assistance Needed: Used walker ADL's / Homemaking Assistance Needed: Able to get bathed with effort, and dressed but occasionally husband assisted with LB dressing            OT Problem List: Decreased strength;Decreased activity tolerance;Impaired balance (sitting and/or standing);Decreased cognition;Decreased safety awareness;Decreased knowledge of use of DME or AE;Cardiopulmonary status limiting activity;Obesity      OT Treatment/Interventions: Self-care/ADL training;Therapeutic exercise;Energy conservation;DME and/or AE instruction;Therapeutic activities;Cognitive remediation/compensation;Patient/family  education;Balance training    OT Goals(Current goals can be found in the care plan section) Acute Rehab OT Goals Patient Stated Goal: get back to normal  OT Goal Formulation: With patient/family Time For Goal Achievement: 05/12/16 Potential to Achieve Goals: Good ADL Goals Pt Will Perform Upper Body Bathing: with min assist;sitting Pt Will Perform Lower Body Bathing: with mod assist;sit to/from stand Pt Will Transfer to Toilet: with mod assist;squat pivot transfer;bedside commode Pt Will Perform Toileting - Clothing Manipulation and hygiene: with mod assist;sit to/from stand Pt/caregiver will Perform Home Exercise Program: Increased strength;Right Upper extremity;Left upper extremity;With written HEP provided;With Supervision  OT Frequency: Min 2X/week   Barriers to D/C:            Co-evaluation              End of Session Equipment Utilized During Treatment: Oxygen Nurse Communication: Mobility status  Activity Tolerance: Patient limited by fatigue Patient left: in bed;with call bell/phone within reach;with family/visitor present  OT Visit Diagnosis: Muscle weakness (generalized) (M62.81);Cognitive communication deficit (R41.841)                ADL either performed or assessed with clinical judgement  Time: 1345-1414 OT Time Calculation (min): 29 min Charges:  OT General Charges $OT Visit: 1 Procedure OT Evaluation $OT Eval Moderate Complexity: 1 Procedure OT Treatments $Therapeutic Activity: 8-22 mins G-Codes:     [facility], OTR/L [phone]   Lucille Passy M 04/28/2016, 3:45 PM

## 2016-04-28 NOTE — Progress Notes (Signed)
Incentive spirometer given to pt with proper return demonstration. Pt meeting 747ml goal.

## 2016-04-28 NOTE — Progress Notes (Signed)
Physical Therapy Treatment Patient Details Name: Jillian Eaton MRN: [id number] DOB: [birth year] Today's Date: 04/28/2016    History of Present Illness Pt is a 66 y/o morbidly obese female who presents s/p seizure with encephalopathy, and aspiration PNA. Pt was intubated in the field (3/4) and extubated 3/7. PMH significant for HTN, asthma, fibromyalgia, heart murmur, DOE/SOB, urinary incontinence.     PT Comments    Pt sitting EOB upon entry. Able to tolerate sitting EOB to eat breakfast for ~ 20 min. Pt requiring max A for bed mobility and unable to attempt any standing or mobility. Recommending CIR for appropriate d/c location to increase independence with functional mobility tasks. Will continue to follow.    Follow Up Recommendations  CIR;Supervision/Assistance - 24 hour     Equipment Recommendations  Other (comment) (TBD by next venue )    Recommendations for Other Services Rehab consult     Precautions / Restrictions Precautions Precautions: Fall Precaution Comments: Chronic B knee pain Restrictions Weight Bearing Restrictions: No    Mobility  Bed Mobility Overal bed mobility: Needs Assistance Bed Mobility: Sit to Supine     Supine to sit: Max assist;+2 for physical assistance;HOB elevated Sit to supine: Max assist;+2 for physical assistance   General bed mobility comments: Instructed pt to prop on elbow for trunk management during sit>supine transfer. Used drawsheet to assist with hip movement. Required BLE elevation assist during transfer.   Transfers Overall transfer level: Needs assistance Equipment used: None Transfers: Lateral/Scoot Transfers          Lateral/Scoot Transfers: +2 physical assistance;Max assist;Total assist General transfer comment: Pt required max A +2 with use of drawsheet to scoot hips to EOB. Dependent +2 for scooting up in bed. Not able to progress to OOB mobility at this time.   Ambulation/Gait                 Stairs             Wheelchair Mobility    Modified Rankin (Stroke Patients Only)       Balance Overall balance assessment: Needs assistance Sitting-balance support: Feet supported;No upper extremity supported Sitting balance-Leahy Scale: Fair Sitting balance - Comments: Able to sit without UE support to eat breakfast at EOB                            Cognition Arousal/Alertness: Awake/alert Behavior During Therapy: WFL for tasks assessed/performed Overall Cognitive Status: Impaired/Different from baseline Area of Impairment: Orientation;Memory;Safety/judgement;Awareness;Problem solving Orientation Level:  (Pt using cues from room for orientation questions. )   Memory: Decreased short-term memory   Safety/Judgement: Decreased awareness of safety;Decreased awareness of deficits Awareness: Emergent Problem Solving: Slow processing;Decreased initiation;Difficulty sequencing;Requires verbal cues;Requires tactile cues General Comments: Pt with increased STM. Unable to recall where she lived or if they had stairs in the house. Husband was answering     Exercises      General Comments General comments (skin integrity, edema, etc.): Husband present throughout session. Pt asleep on EOB upon entry while eating breakfast.       Pertinent Vitals/Pain Pain Assessment: Faces Faces Pain Scale: Hurts whole lot Pain Location: Lower legs and B knees Pain Descriptors / Indicators: Sore Pain Intervention(s): Limited activity within patient's tolerance;Monitored during session;Repositioned    Home Living Family/patient expects to be discharged to:: Private residence Living Arrangements: Spouse/significant other Available Help at Discharge: Family;Available 24 hours/day Type of Home: House Home Access: Stairs to enter  Entrance Stairs-Rails: None Home Layout: Two level;Bed/bath upstairs Home Equipment: Tub bench;Walker - 2 wheels;Bedside commode Additional Comments: Difficulty  getting upstairs to bedroom.    Prior Function Level of Independence: Independent with assistive device(s);Needs assistance  Gait / Transfers Assistance Needed: Used walker ADL's / Homemaking Assistance Needed: Able to get bathed/dressed but occasionally husband assisted with LB dressing     PT Goals (current goals can now be found in the care plan section) Acute Rehab PT Goals Patient Stated Goal: Get her memory back. Go home.  PT Goal Formulation: With patient/family Time For Goal Achievement: 05/12/16 Potential to Achieve Goals: Good Progress towards PT goals: Progressing toward goals    Frequency    Min 3X/week      PT Plan Current plan remains appropriate    Co-evaluation             End of Session Equipment Utilized During Treatment: Oxygen Activity Tolerance: Patient limited by fatigue Patient left: in bed;with call bell/phone within reach;with family/visitor present Nurse Communication: Mobility status PT Visit Diagnosis: Other abnormalities of gait and mobility (R26.89);Muscle weakness (generalized) (M62.81)     Time: 7209-4709 PT Time Calculation (min) (ACUTE ONLY): 13 min  Charges:  $Gait Training: 8-22 mins $Therapeutic Activity: 8-22 mins                    G Codes:       Mamie Levers 04/28/2016, 12:36 PM  Nicky Pugh, PT, DPT  Acute Rehabilitation Services  Pager: [phone]

## 2016-04-28 NOTE — Progress Notes (Signed)
    Called about SVT. Dr. Radford Pax reviewed tele and thinks ectopic atrial tach. Currently back in normal rhythm. Will have EP tomorrow.    Angelena Form PA-C  [facility]

## 2016-04-28 NOTE — Progress Notes (Signed)
Progress Note    Jillian Eaton  MWN:[id number] DOB: 1950-07-07  DOA: 04/23/2016 PCP: Mauricia Area, MD    Brief Narrative:   Chief complaint: Follow-up aspiration pneumonia and seizures  Medical records reviewed and are as summarized below.  Jillian Eaton is an 66 y.o. 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 She was intubated by EMS prior to transport to the hospital. She was given Versed for suspected seizure activity, and was incontinent of bowels and bladder. Her fever was 103 and she had leukocytosis with a left shift on initial presentation. She was started on vancomycin/aztreonam/Levaquin. Initial chest x-ray showed findings concerning for aspiration. She was successfully extubated 04/26/16 and her care was transferred to [facility] on 04/28/16.  Assessment/Plan:   Principal Problem:   Acute on chronic respiratory failure with hypoxemia (HCC)/aspiration pneumonia/focal cord dysfunction/pulmonary hypertension Patient was admitted by the critical care team and required intubation. Extubated 04/26/16. Placed on broad-spectrum antibiotics including Azactam, vancomycin, Levaquin, and cefepime. Currently on Fortaz and vancomycin. Blood, urine cultures negative. Influenza panel negative. Continue bronchodilators. Respiratory status stable.  Active Problems:   Super Morbid Obesity (BMI >60) Will need SNF for rehabilitation. Sedentary prior to admission. Body mass index is 48.5 kg/m.    Essential hypertension Continue Cozaar.    Hypothyroidism Continue Synthroid. TSH 9.239.    Toxic metabolic encephalopathy/seizures Evaluated by neurologist. CT of the head negative. EEG  done 04/24/16 and showed diffuse slowing with rhythmic sharp waves in the left parietal region. Currently on Keppra. No evidence of ongoing seizure activity. Neurology recommends weaning Keppra after acute illness resolved.    Atrial fibrillation [facility]) Cardiology consulted and patient placed on a heparin drip. Currently on subcutaneous heparin as she is not a good candidate for long-term anticoagulation. Continue aspirin. Has been maintaining normal sinus rhythm.    Elevated troponin/non-ST elevation MI/acute systolic CHF 2-D echo showed EF 25-30 percent with mild MR and mild LA dilatation. Continue aspirin and Lasix. Avoid beta blockers in the setting of AV block. Noninvasive imaging not felt to be feasible due to her super morbid obesity. Medical management recommended by cardiology. Recommends repeat echocardiogram in 3 months to assess for recovery of LV function.    Thrombocytopenia (HCC) Resolved.    Diarrhea Patient has a rectal tube in place. C. difficile studies were negative.    Hypokalemia Monitor and replace potassium as needed.    Anemia of critical illness and chronic disease No current indication for transfusion.    Dysphagia Evaluated by speech therapy. Continue dysphagia 2 diet.  HIV screening The patient falls between the ages of 13-64 and should be screened for HIV, therefore HIV testing done 04/23/16: Negative.   Family Communication/Anticipated D/C date and plan/Code Status   DVT prophylaxis: Heparin ordered. Code Status: Full Code.  Family Communication: Husband at the bedside. Disposition Plan: CIR vs SNF   Medical Consultants:    Cardiology  Pulmonology  Neurology  CIR   Procedures:    EEG 04/24/16  2-D echocardiogram 04/24/16  Anti-Infectives:    Vancomycin 3/04 >>  Azactam 3/04 >> 3/05   Levaquin 3/04 >> 3/05   Cefepime 3/05 >> 3/07  Elita Quick 3/07 >>  Subjective:   Patient states she does  not remember much from the last few days.  Breathing okay. Appetite okay. No abdominal cramps. Has a rectal tube in.  Objective:    Vitals:   04/27/16 1933 04/27/16 2132 04/28/16 0428 04/28/16 0537  BP:  133/87  135/82  Pulse:  78  77  Resp:  18  19  Temp: 99 F (37.2 C) 98 F (36.7 C)  98.7 F (37.1 C)  TempSrc: Oral Oral  Oral  SpO2:  99% 96% 94%  Weight:      Height:        Intake/Output Summary (Last 24 hours) at 04/28/16 1619 Last data filed at 04/28/16 0651  Gross per 24 hour  Intake            623.5 ml  Output              450 ml  Net            173.5 ml   Filed Weights   04/25/16 0500 04/26/16 0410 04/27/16 0829  Weight: (!) 147.4 kg (325 lb) (!) 144.2 kg (318 lb) (!) 144.7 kg (319 lb)    Exam: General exam: Appears calm and comfortable. Morbidly obese. Respiratory system: Diminished but clear. Respiratory effort normal. Cardiovascular system: S1 & S2 heard, RRR. No JVD,  rubs, gallops or clicks. No murmurs. Gastrointestinal system: Abdomen is nondistended, soft and nontender. No organomegaly or masses felt. Normal bowel sounds heard. Central nervous system: Alert and oriented x 2. No focal neurological deficits. Generalized weakness. Extremities: No clubbing,  or cyanosis. 1+ edema. Skin: No rashes, lesions or ulcers. Psychiatry: Judgement and insight appear fair. Mood & affect appropriate.   Data Reviewed:   I have personally reviewed following labs and imaging studies:  Labs: Basic Metabolic Panel:  Recent Labs Lab 04/23/16 2001 04/24/16 1826 04/25/16 0250 04/25/16 1816 04/26/16 0422 04/27/16 0345 04/28/16 0945  NA 137  --  140  --  142 145 141  K 3.2*  --  2.9*  --  3.5 3.6 3.6  CL 98*  --  105  --  107 103 101  CO2 24  --  27  --  30 31 28   GLUCOSE 126*  --  112*  --  94 92 121*  BUN 11  --  24*  --  25* 19 16  CREATININE 1.13*  --  1.14*  --  0.89 0.92 0.95  CALCIUM 8.2*  --  7.1*  --  7.2* 7.7* 8.7*  MG 1.2* 2.0 1.9 1.8  --   --   --   PHOS  --  3.3 3.5 3.6  --   --   --     GFR Estimated Creatinine Clearance: 92.1 mL/min (by C-G formula based on SCr of 0.95 mg/dL). Liver Function Tests:  Recent Labs Lab 04/23/16 2001  AST 34  ALT 14  ALKPHOS 90  BILITOT 0.9  PROT 7.5  ALBUMIN 3.8   No results for input(s): LIPASE, AMYLASE in the last 168 hours. No results for input(s): AMMONIA in the last 168 hours. Coagulation profile No results for input(s): INR, PROTIME in the last 168 hours.  CBC:  Recent Labs Lab 04/23/16 2001 04/25/16 0250 04/26/16 0422 04/27/16 0345 04/28/16 0945  WBC 14.6* 10.5 7.6 7.7 8.5  NEUTROABS 12.5*  --   --   --   --   HGB 14.3 10.8* 9.6* 11.1* 14.1  HCT 43.4 32.9* 30.2* 35.0* 43.1  MCV 97.1 97.3 97.7 98.3 98.2  PLT 160  118* 125* 174 209   Cardiac Enzymes:  Recent Labs Lab 04/23/16 2001 04/24/16 0233 04/24/16 1826 04/25/16 0000 04/25/16 1051  CKTOTAL 201  --   --   --   --   TROPONINI  --  10.54* 4.10* 3.17* 2.03*   BNP (last 3 results) No results for input(s): PROBNP in the last 8760 hours. CBG:  Recent Labs Lab 04/26/16 2322 04/27/16 0310 04/27/16 0827 04/27/16 1150 04/27/16 1621  GLUCAP 99 89 94 78 83   D-Dimer: No results for input(s): DDIMER in the last 72 hours. Hgb A1c: No results for input(s): HGBA1C in the last 72 hours. Lipid Profile: No results for input(s): CHOL, HDL, LDLCALC, TRIG, CHOLHDL, LDLDIRECT in the last 72 hours. Thyroid function studies: No results for input(s): TSH, T4TOTAL, T3FREE, THYROIDAB in the last 72 hours.  Invalid input(s): FREET3 Anemia work up: No results for input(s): VITAMINB12, FOLATE, FERRITIN, TIBC, IRON, RETICCTPCT in the last 72 hours. Sepsis Labs:  Recent Labs Lab 04/23/16 2015 04/25/16 0250 04/26/16 0422 04/27/16 0345 04/28/16 0945  WBC  --  10.5 7.6 7.7 8.5  LATICACIDVEN 1.51  --   --   --   --     Microbiology Recent Results (from the past 240 hour(s))  Blood Culture (routine x 2)     Status: None   Collection Time: 04/23/16  7:45 PM   Result Value Ref Range Status   Specimen Description BLOOD LEFT ANTECUBITAL  Final   Special Requests BOTTLES DRAWN AEROBIC AND ANAEROBIC 5CC  Final   Culture NO GROWTH 5 DAYS  Final   Report Status 04/28/2016 FINAL  Final  Blood Culture (routine x 2)     Status: None   Collection Time: 04/23/16  8:01 PM  Result Value Ref Range Status   Specimen Description BLOOD LEFT HAND  Final   Special Requests IN PEDIATRIC BOTTLE  3CC  Final   Culture NO GROWTH 5 DAYS  Final   Report Status 04/28/2016 FINAL  Final  Urine culture     Status: None   Collection Time: 04/23/16 11:59 PM  Result Value Ref Range Status   Specimen Description URINE, RANDOM  Final   Special Requests NONE  Final   Culture NO GROWTH  Final   Report Status 04/25/2016 FINAL  Final  MRSA PCR Screening     Status: None   Collection Time: 04/24/16 12:02 AM  Result Value Ref Range Status   MRSA by PCR NEGATIVE NEGATIVE Final    Comment:        The GeneXpert MRSA Assay (FDA approved for NASAL specimens only), is one component of a comprehensive MRSA colonization surveillance program. It is not intended to diagnose MRSA infection nor to guide or monitor treatment for MRSA infections.   C difficile quick scan w PCR reflex     Status: None   Collection Time: 04/24/16  8:03 AM  Result Value Ref Range Status   C Diff antigen NEGATIVE NEGATIVE Final   C Diff toxin NEGATIVE NEGATIVE Final   C Diff interpretation No C. difficile detected.  Final    Radiology: No results found.  Medications:   . aspirin EC  81 mg Oral Daily  . cefTAZidime (FORTAZ)  IV  2 g Intravenous Q8H  . furosemide  40 mg Oral Daily  . heparin subcutaneous  5,000 Units Subcutaneous Q8H  . levETIRAcetam  750 mg Oral BID  . levothyroxine  150 mcg Oral QAC breakfast  . losartan  50 mg  Oral Daily  . mouth rinse  15 mL Mouth Rinse BID  . pantoprazole  40 mg Oral QHS  . potassium chloride  20 mEq Oral Daily   Continuous Infusions:  Medical  decision making is of high complexity and this patient is at high risk of deterioration, therefore this is a level 3 visit.  (> 4 problem points, >4 data points, high risk: Severe exacerbation of chronic illness)    LOS: 5 days   Shatisha Falter  [facility] Pager [phone]. If unable to reach me by pager, please call my cell phone at [phone].  *Please refer to [email or web address], password [email or web address] to get updated schedule on who will round on this patient, as hospitalists switch teams weekly. If 7PM-7AM, please contact night-coverage at [email or web address], password [email or web address] for any overnight needs.  04/28/2016, 4:19 PM

## 2016-04-28 NOTE — Progress Notes (Signed)
Nutrition Follow-up  DOCUMENTATION CODES:   Morbid obesity  INTERVENTION:   -Continue with dysphagia 2 diet  NUTRITION DIAGNOSIS:   Inadequate oral intake related to inability to eat as evidenced by NPO status.  Resolved, no new nutrition dx at this time  GOAL:   Patient will meet greater than or equal to 90% of their needs  Met  MONITOR:   PO intake, Diet advancement, Labs, Weight trends, Skin, I & O's  REASON FOR ASSESSMENT:   Consult Enteral/tube feeding initiation and management  ASSESSMENT:   66 yo female brought to ER with seizure and altered mental status.  Intubated for airway protection.  She has PMHx of chronic pain, fibromyalgia, hypothyroidism, diastolic CHF, asthma.  She was febrile on admission with CXR concerning for aspiration.  3/7- extubated 3/8- s/p BSE- advanced to dysphagia 2 diet with thin liquids secondary to lethargy 3/8- transferred from ICU to medical floor  Spoke with pt, husband, and daughter at bedside. All report that pt generally has a good appetite. Pt daughter shares that pt was intentionally trying to lose weight PTA and experienced approximately a 50# wt loss over the past 4-5 months.   Pt denies any difficulty tolerating dysphagia 2 diet. Reviewed breakfast tray at bedside. Pt consumed about 75%.  Discussed with pt and family importance of good meal intake to promote healing. Encouraged continued slow, gradual weight loss by lifestyle change. Pt and family have no further nutritional concerns, howevevr expressed appreciation for visit.   Labs reviewed.  Diet Order:  DIET DYS 2 Room service appropriate? Yes; Fluid consistency: Thin  Skin:  Reviewed, no issues  Last BM:  04/27/16  Height:   Ht Readings from Last 1 Encounters:  04/24/16 5' 8"  (1.727 m)    Weight:   Wt Readings from Last 1 Encounters:  04/27/16 (!) 319 lb (144.7 kg)    Ideal Body Weight:  63.6 kg  BMI:  Body mass index is 48.5 kg/m.  Estimated  Nutritional Needs:   Kcal:  1600-1800  Protein:  110-125 grams  Fluid:  1.6-1.8 L  EDUCATION NEEDS:   No education needs identified at this time  Insiya Oshea A. Jimmye Norman, RD, LDN, CDE Pager: [phone] After hours Pager: [phone]

## 2016-04-28 NOTE — Evaluation (Signed)
Physical Therapy Evaluation Patient Details Name: Jillian Eaton MRN: [id number] DOB: [birth year] Today's Date: 04/28/2016   History of Present Illness  Pt is a 66 y/o morbidly obese female who presents s/p seizure with encephalopathy, and aspiration PNA. Pt was intubated in the field (3/4) and extubated 3/7. PMH significant for HTN, asthma, fibromyalgia, heart murmur, DOE/SOB, urinary incontinence.   Clinical Impression  Pt admitted with above diagnosis. Pt currently with functional limitations due to the deficits listed below (see PT Problem List). At the time of PT eval pt was able to perform transfer to EOB to eat breakfast with +2 max assist and use of drawsheet for assist with scooting. PTA, husband reports that she had difficulty managing stairs at home but was able to do it. They are agreeable to rehab, and feel she would be a good candidate for CIR if activity tolerance improves. Pt will benefit from skilled PT to increase their independence and safety with mobility to allow discharge to the venue listed below.       Follow Up Recommendations CIR;Supervision/Assistance - 24 hour    Equipment Recommendations  None recommended by PT    Recommendations for Other Services Rehab consult     Precautions / Restrictions Precautions Precautions: Fall Precaution Comments: Chronic B knee pain Restrictions Weight Bearing Restrictions: No      Mobility  Bed Mobility Overal bed mobility: Needs Assistance Bed Mobility: Supine to Sit     Supine to sit: Max assist;+2 for physical assistance;HOB elevated     General bed mobility comments: Use of drawsheet for assist to scoot hips around. Pt required +2 max assist to elevate trunk into full sitting position. Increased time and effort to scoot completely out to EOB.   Transfers                 General transfer comment: Not able to progress to OOB mobility at this time.   Ambulation/Gait                Stairs            Wheelchair Mobility    Modified Rankin (Stroke Patients Only)       Balance Overall balance assessment: Needs assistance Sitting-balance support: Feet supported;No upper extremity supported Sitting balance-Leahy Scale: Fair Sitting balance - Comments: Able to sit without UE support to eat breakfast at EOB                                     Pertinent Vitals/Pain Pain Assessment: Faces Faces Pain Scale: Hurts whole lot Pain Location: Lower legs and B knees Pain Descriptors / Indicators: Sore Pain Intervention(s): Limited activity within patient's tolerance;Monitored during session;Repositioned    Home Living Family/patient expects to be discharged to:: Private residence Living Arrangements: Spouse/significant other Available Help at Discharge: Family;Available 24 hours/day Type of Home: House Home Access: Stairs to enter Entrance Stairs-Rails: None Entrance Stairs-Number of Steps: 1 Home Layout: Two level;Bed/bath upstairs Home Equipment: Tub bench;Walker - 2 wheels;Bedside commode Additional Comments: Difficulty getting upstairs to bedroom.    Prior Function Level of Independence: Independent with assistive device(s);Needs assistance   Gait / Transfers Assistance Needed: Used walker  ADL's / Homemaking Assistance Needed: Able to get bathed/dressed but occasionally husband assisted with LB dressing        Hand Dominance        Extremity/Trunk Assessment   Upper Extremity Assessment Upper Extremity  Assessment: Defer to OT evaluation    Lower Extremity Assessment Lower Extremity Assessment: Generalized weakness (Bilateral knee pain)    Cervical / Trunk Assessment Cervical / Trunk Assessment: Other exceptions Cervical / Trunk Exceptions: Forward head and rounded shoulder posture  Communication   Communication: No difficulties  Cognition Arousal/Alertness: Awake/alert Behavior During Therapy: WFL for tasks assessed/performed Overall  Cognitive Status: Impaired/Different from baseline Area of Impairment: Orientation;Memory;Safety/judgement;Awareness;Problem solving Orientation Level:  (Pt using cues in room to answer orientation questions.)   Memory: Decreased short-term memory   Safety/Judgement: Decreased awareness of safety;Decreased awareness of deficits Awareness: Emergent Problem Solving: Slow processing;Decreased initiation;Difficulty sequencing;Requires verbal cues;Requires tactile cues General Comments: Pt with increased STM. Unable to recall where she lived or if they had stairs in the house. Husband was answering     General Comments      Exercises     Assessment/Plan    PT Assessment Patient needs continued PT services  PT Problem List Decreased strength;Decreased range of motion;Decreased activity tolerance;Decreased balance;Decreased mobility;Decreased knowledge of use of DME;Decreased safety awareness;Decreased knowledge of precautions;Pain       PT Treatment Interventions DME instruction;Stair training;Gait training;Functional mobility training;Therapeutic activities;Balance training;Cognitive remediation    PT Goals (Current goals can be found in the Care Plan section)  Acute Rehab PT Goals Patient Stated Goal: Get her memory back. Go home.  PT Goal Formulation: With patient/family Time For Goal Achievement: 05/12/16 Potential to Achieve Goals: Good    Frequency Min 3X/week   Barriers to discharge Decreased caregiver support;Inaccessible home environment Pt has stairs to her bedroom/bathroom. Husband will need to provide significant physical assistance at this point.     Co-evaluation               End of Session Equipment Utilized During Treatment: Oxygen Activity Tolerance: Patient limited by fatigue Patient left: in bed;with call bell/phone within reach;with family/visitor present (Sitting EOB. Pt) Nurse Communication: Mobility status PT Visit Diagnosis: Other abnormalities  of gait and mobility (R26.89);Muscle weakness (generalized) (M62.81)         Time: 1093-2355 PT Time Calculation (min) (ACUTE ONLY): 31 min   Charges:   PT Evaluation $PT Eval Moderate Complexity: 1 Procedure PT Treatments $Gait Training: 8-22 mins   PT G Codes:         Thelma Comp 04/28/2016, 12:15 PM   Rolinda Roan, PT, DPT Acute Rehabilitation Services Pager: [phone]

## 2016-04-28 NOTE — Progress Notes (Signed)
Pt had an episode of tachycardia in the 150's for a few minutes. Pt was asleep. No symptoms. NP notified. Will continue to order.

## 2016-04-28 NOTE — Progress Notes (Signed)
Both PT and OT are recommending an inpt rehab consult. Please place order if pt would like to be considered for an inpt rehab admission. [phone]

## 2016-04-28 NOTE — Progress Notes (Signed)
Rehab Admissions Coordinator Note:  Patient was screened by Cleatrice Burke for appropriateness for an Inpatient Acute Rehab Consult per PT recommendation.   At this time, we are recommending await further progress with therapy before determining rehab venue options. Poor tolerance at this time. OT eval pending. I will follow.  Cleatrice Burke 04/28/2016, 12:22 PM  I can be reached at [phone].

## 2016-04-28 NOTE — Progress Notes (Signed)
Progress Note  Patient Name: Jillian Eaton Date of Encounter: 04/28/2016  Primary Cardiologist: Dr [name]  Subjective   Patient awake and alert. She is comfortable and states breathing is okay. No chest pain. Concerned because she cannot remember much about the recent events  Inpatient Medications    Scheduled Meds: . aspirin EC  81 mg Oral Daily  . cefTAZidime (FORTAZ)  IV  2 g Intravenous Q8H  . furosemide  40 mg Oral Daily  . heparin subcutaneous  5,000 Units Subcutaneous Q8H  . levETIRAcetam  750 mg Oral BID  . levothyroxine  150 mcg Oral QAC breakfast  . losartan  50 mg Oral Daily  . mouth rinse  15 mL Mouth Rinse BID  . pantoprazole  40 mg Oral QHS  . potassium chloride  20 mEq Oral Daily   Continuous Infusions:  PRN Meds: sodium chloride, acetaminophen, levalbuterol, metoprolol   Vital Signs    Vitals:   04/27/16 1933 04/27/16 2132 04/28/16 0428 04/28/16 0537  BP:  133/87  135/82  Pulse:  78  77  Resp:  18  19  Temp: 99 F (37.2 C) 98 F (36.7 C)  98.7 F (37.1 C)  TempSrc: Oral Oral  Oral  SpO2:  99% 96% 94%  Weight:      Height:        Intake/Output Summary (Last 24 hours) at 04/28/16 1135 Last data filed at 04/28/16 0651  Gross per 24 hour  Intake            713.5 ml  Output             1775 ml  Net          -1061.5 ml   Filed Weights   04/25/16 0500 04/26/16 0410 04/27/16 0829  Weight: (!) 325 lb (147.4 kg) (!) 318 lb (144.2 kg) (!) 319 lb (144.7 kg)  BP 135/82 (BP Location: Right Arm)   Pulse 77   Temp 98.7 F (37.1 C) (Oral)   Resp 19   Ht 5\' 8"  (1.727 m)   Wt (!) 319 lb (144.7 kg)   SpO2 94%   BMI 48.50 kg/m    Telemetry    Sinus rhythm, no bradycardia. Multiple episodes of tachycardia, ?atrial tach vs atrial fib - Personally Reviewed   Physical Exam  Pleasant, morbidly obese woman in no distress GEN: No acute distress.   Neck: No JVD seen Cardiac: RRR, no murmurs, rubs, or gallops.  Respiratory: Clear to auscultation  bilaterally. Respirations are not very deep.  GI: Soft, nontender, non-distended  MS: no edema; No deformity. Neuro:  Nonfocal  Psych: Normal affect   Labs    Chemistry Recent Labs Lab 04/23/16 2001  04/26/16 0422 04/27/16 0345 04/28/16 0945  NA 137  < > 142 145 141  K 3.2*  < > 3.5 3.6 3.6  CL 98*  < > 107 103 101  CO2 24  < > 30 31 28   GLUCOSE 126*  < > 94 92 121*  BUN 11  < > 25* 19 16  CREATININE 1.13*  < > 0.89 0.92 0.95  CALCIUM 8.2*  < > 7.2* 7.7* 8.7*  PROT 7.5  --   --   --   --   ALBUMIN 3.8  --   --   --   --   AST 34  --   --   --   --   ALT 14  --   --   --   --  ALKPHOS 90  --   --   --   --   BILITOT 0.9  --   --   --   --   GFRNONAA 51*  < > >60 >60 >60  GFRAA 59*  < > >60 >60 >60  ANIONGAP 15  < > 5 11 12   < > = values in this interval not displayed.   Hematology  Recent Labs Lab 04/26/16 0422 04/27/16 0345 04/28/16 0945  WBC 7.6 7.7 8.5  RBC 3.09* 3.56* 4.39  HGB 9.6* 11.1* 14.1  HCT 30.2* 35.0* 43.1  MCV 97.7 98.3 98.2  MCH 31.1 31.2 32.1  MCHC 31.8 31.7 32.7  RDW 14.5 14.3 13.8  PLT 125* 174 209    Cardiac Enzymes  Recent Labs Lab 04/24/16 0233 04/24/16 1826 04/25/16 0000 04/25/16 1051  TROPONINI 10.54* 4.10* 3.17* 2.03*     Recent Labs Lab 04/23/16 2013  TROPIPOC 0.87*     Radiology    No results found.  Cardiac Studies   Trop 10.54 --->4.10 ---> 3.17--->2.03  Echo: Study Conclusions - Left ventricle: Diffuse hypokinesis worse in the inferior wall The cavity size was mildly dilated. Wall thickness was normal. Systolic function was severely reduced. The estimated ejection fraction was in the range of 25% to 30%. Doppler parameters are consistent with both elevated ventricular end-diastolic filling pressure and elevated left atrial filling pressure. - Mitral valve: There was mild regurgitation. - Left atrium: The atrium was mildly dilated. - Atrial septum: No defect or patent foramen ovale was  identified.  Patient Profile     66 y.o. female with hypoxic respiratory arrest, out-of-hospital  Assessment & Plan    1. Acute systolic CHF -  Losartan resumed 03/08. Avoid beta blocker in the setting of AV block. Started oral furosemide 40 mg daily 03/08.  2. NSTEMI (troponin elevation without report of chest pain and without ST elevation) - likely demand ischemia. Patient without chest pain. Noninvasive imaging is not feasible because of her body habitus. Recommend medical therapy.  3. Acute respiratory failure (VDRF) - improving. Extubated 03/07. Management per CCM  4. Paroxysmal atrial fibrillation - only occurring during her acute respiratory failure. Maintaining sinus rhythm now. Recommend continue telemetry and would avoid oral anticoagulation unless she has recurrent atrial fibrillation. Will change heparin to DVT prophylaxis dose.  **Pt had multiple episodes of tachycardia, some prolonged in the last 24 hours. Possible atrial tach vs atrial fib. MD to review strips and advise. If has atrial fib, will need anticoag (CHADS2VASCc=3, CHF, MI, HTN)  5. Complete heart block: narrow complex escape, no bradycardic event. Occurring during acute illness/respiratory failure. Continue to monitor. Avoid AV nodal blocking agents.   Signed, Rosaria Ferries, PA-C  04/28/2016, 11:35 AM    Patient seen, examined. Available data reviewed. Agree with findings, assessment, and plan as outlined by Rosaria Ferries, PA-C. The patient is independently evaluated. Her husband is at the bedside. Heart is regular rate and rhythm, lungs are clear bilaterally, extremities have mild edema with tenderness in the pretibial region. The patient does not remember many of the events over the last few days. Telemetry is personally reviewed and demonstrates sinus tachycardia with Mobitz 1 AV block. I do not see any episodes of atrial fibrillation. Would avoid beta blockade at present. Continue furosemide and losartan as  above. Plan to follow-up with an echocardiogram in about 3 months to assess for recovery of LV function. I think her elevated troponin is most likely related to demand ischemia rather  than acute coronary syndrome. Will check a fasting lipid panel but I would not start her on a statin drug unless she has hyperlipidemia with indication for treatment.   Sherren Mocha, M.D. 04/28/2016 3:34 PM

## 2016-04-28 NOTE — Progress Notes (Signed)
Pt had episode of increased heart rate after breathing treatment. MD aware. Went to room with orders for lopressor for increased hrt rate and pt was back down to 94-95. Med held. Pt sitting up on side of bed eating breakfast during episode.

## 2016-04-29 DIAGNOSIS — D696 Thrombocytopenia, unspecified: Secondary | ICD-10-CM

## 2016-04-29 DIAGNOSIS — J9621 Acute and chronic respiratory failure with hypoxia: Secondary | ICD-10-CM

## 2016-04-29 DIAGNOSIS — J383 Other diseases of vocal cords: Secondary | ICD-10-CM

## 2016-04-29 LAB — BASIC METABOLIC PANEL
ANION GAP: 14 (ref 5–15)
BUN: 15 mg/dL (ref 6–20)
CALCIUM: 8.3 mg/dL — AB (ref 8.9–10.3)
CO2: 32 mmol/L (ref 22–32)
CREATININE: 0.96 mg/dL (ref 0.44–1.00)
Chloride: 97 mmol/L — ABNORMAL LOW (ref 101–111)
Glucose, Bld: 85 mg/dL (ref 65–99)
Potassium: 3.5 mmol/L (ref 3.5–5.1)
SODIUM: 143 mmol/L (ref 135–145)

## 2016-04-29 LAB — CBC
HEMATOCRIT: 42.3 % (ref 36.0–46.0)
Hemoglobin: 13.8 g/dL (ref 12.0–15.0)
MCH: 32.2 pg (ref 26.0–34.0)
MCHC: 32.6 g/dL (ref 30.0–36.0)
MCV: 98.8 fL (ref 78.0–100.0)
PLATELETS: 203 10*3/uL (ref 150–400)
RBC: 4.28 MIL/uL (ref 3.87–5.11)
RDW: 14 % (ref 11.5–15.5)
WBC: 9.3 10*3/uL (ref 4.0–10.5)

## 2016-04-29 MED ORDER — POTASSIUM CHLORIDE CRYS ER 20 MEQ PO TBCR
20.0000 meq | EXTENDED_RELEASE_TABLET | Freq: Two times a day (BID) | ORAL | Status: DC
  Administered 2016-04-29 – 2016-05-05 (×13): 20 meq via ORAL
  Filled 2016-04-29 (×14): qty 1

## 2016-04-29 NOTE — Progress Notes (Signed)
Progress Note    Jillian Eaton  WGN:[id number] DOB: Nov 14, 1950  DOA: 04/23/2016 PCP: Mauricia Area, MD    Brief Narrative:   Chief complaint: Follow-up aspiration pneumonia and seizures  Medical records reviewed and are as summarized below.  Jillian Eaton is an 66 y.o. female with a PMH of restrictive lung disease secondary to morbid obesity status post gastroplasty in 1985, asthma, chronic pain syndrome on chronic narcotic therapy (history of unintentional narcotic overdose), fibromyalgia, hypothyroidism, chronic diastolic CHF who was admitted 08/27/44 by the critical care team after she was found down and unresponsive. She was intubated by EMS prior to transport to the hospital. She was given Versed for suspected seizure activity, and was incontinent of bowels and bladder. Her fever was 103 and she had leukocytosis with a left shift on initial presentation. She was started on vancomycin/aztreonam/Levaquin. Initial chest x-ray showed findings concerning for aspiration. She was successfully extubated 04/26/16 and her care was transferred to [facility] on 04/28/16.  Assessment/Plan:   Principal Problem:   Acute on chronic respiratory failure with hypoxemia (HCC)/aspiration pneumonia/focal cord dysfunction/pulmonary hypertension Patient was admitted by the critical care team and required intubation. Extubated 04/26/16. Placed on broad-spectrum antibiotics including Azactam, vancomycin, Levaquin, and cefepime. Currently on Fortaz and vancomycin, s/p a 7 day course of treatment. Blood, urine cultures negative. Influenza panel negative. Continue bronchodilators. Respiratory status stable.  Active Problems:   Super Morbid Obesity (BMI >60) Will need CIR vs SNF for rehabilitation. Sedentary prior to admission. Body mass index is 48.5 kg/m.    Essential hypertension Continue Cozaar.     Hypothyroidism Continue Synthroid. TSH 9.239.    Toxic metabolic encephalopathy/seizures Evaluated by  neurologist. CT of the head negative. EEG done 04/24/16 and showed diffuse slowing with rhythmic sharp waves in the left parietal region. Currently on Keppra. No evidence of ongoing seizure activity. Neurology recommends weaning Keppra after acute illness resolved.    Atrial fibrillation (HCC)/ectopic atrial tachycardia Cardiology consulted and patient placed on a heparin drip. Currently on subcutaneous heparin as she is not a good candidate for long-term anticoagulation. Continue aspirin. Continues to have bursts of tachycardic activity. EP study planned.    Elevated troponin/non-ST elevation MI/acute systolic CHF 2-D echo showed EF 25-30 percent with mild MR and mild LA dilatation. Continue aspirin and Lasix. Avoid beta blockers in the setting of AV block. Noninvasive imaging not felt to be feasible due to her super morbid obesity. Medical management recommended by cardiology. Recommends repeat echocardiogram in 3 months to assess for recovery of LV function.    Thrombocytopenia (HCC) Resolved.    Diarrhea Patient has a rectal tube in place. C. difficile studies were negative.    Hypokalemia On routine supplementation.    Anemia of critical illness and chronic disease No current indication for transfusion.    Dysphagia Evaluated by speech therapy. Continue dysphagia 2 diet.  HIV screening Screening done 04/23/16: Negative.   Family Communication/Anticipated D/C date and plan/Code Status   DVT prophylaxis: Heparin ordered. Code Status: Full Code.  Family Communication: Husband at the bedside. Disposition Plan: CIR vs SNF   Medical Consultants:    Cardiology  Pulmonology  Neurology  CIR   Procedures:    EEG 04/24/16  2-D echocardiogram 04/24/16  Anti-Infectives:    Vancomycin 3/04 >>3/10  Azactam 3/04 >> 3/05   Levaquin 3/04 >> 3/05   Cefepime 3/05 >> 3/07  Elita Quick 3/07 >>3/10  Subjective:   Breathing okay. Appetite okay. Feels weak.  Objective:     Vitals:   04/28/16 2233 04/29/16 0000 04/29/16 0510 04/29/16 0647  BP: 133/88   (!) 123/52  Pulse: 84 83  75  Resp: 18 18  18   Temp: 98.2 F (36.8 C)   98.2 F (36.8 C)  TempSrc: Oral   Oral  SpO2: 97% 93%  98%  Weight:   (!) 141.8 kg (312 lb 9.6 oz)   Height:        Intake/Output Summary (Last 24 hours) at 04/29/16 0859 Last data filed at 04/29/16 0510  Gross per 24 hour  Intake                0 ml  Output             1575 ml  Net            -1575 ml   Filed Weights   04/26/16 0410 04/27/16 0829 04/29/16 0510  Weight: (!) 144.2 kg (318 lb) (!) 144.7 kg (319 lb) (!) 141.8 kg (312 lb 9.6 oz)    Exam: General exam: Appears calm and comfortable. Morbidly obese. Respiratory system: Diminished but clear. Respiratory effort normal. Cardiovascular system: S1 & S2 heard, RRR. No JVD,  rubs, gallops or clicks. No murmurs. Gastrointestinal system: Abdomen is nondistended, soft and nontender. No organomegaly or masses felt. Normal bowel sounds heard. Central nervous system: Alert and oriented x 2. No focal neurological deficits. Generalized weakness. Extremities: No clubbing,  or cyanosis. 1+ edema. Skin: No rashes, lesions or ulcers. Psychiatry: Judgement and insight appear fair. Mood & affect appropriate.   Data Reviewed:   I have personally reviewed following labs and imaging studies:  Labs: Basic Metabolic Panel:  Recent Labs Lab 04/23/16 2001 04/24/16 1826 04/25/16 0250 04/25/16 1816 04/26/16 0422 04/27/16 0345 04/28/16 0945 04/29/16 0536  NA 137  --  140  --  142 145 141 143  K 3.2*  --  2.9*  --  3.5 3.6 3.6 3.5  CL 98*  --  105  --  107 103 101 97*  CO2 24  --  27  --  30 31 28  32  GLUCOSE 126*  --  112*  --  94 92 121* 85  BUN 11  --  24*  --  25* 19 16 15   CREATININE 1.13*  --  1.14*  --  0.89 0.92 0.95 0.96  CALCIUM 8.2*  --  7.1*  --  7.2* 7.7* 8.7* 8.3*  MG 1.2* 2.0 1.9 1.8  --   --   --   --   PHOS  --  3.3 3.5 3.6  --   --   --   --     GFR Estimated Creatinine Clearance: 90.1 mL/min (by C-G formula based on SCr of 0.96 mg/dL). Liver Function Tests:  Recent Labs Lab 04/23/16 2001  AST 34  ALT 14  ALKPHOS 90  BILITOT 0.9  PROT 7.5  ALBUMIN 3.8   No results for input(s): LIPASE, AMYLASE in the last 168 hours. No results for input(s): AMMONIA in the last 168 hours. Coagulation profile No results for input(s): INR, PROTIME in the last 168 hours.  CBC:  Recent Labs Lab 04/23/16 2001 04/25/16 0250 04/26/16 0422 04/27/16 0345 04/28/16 0945 04/29/16 0536  WBC 14.6* 10.5 7.6 7.7 8.5 9.3  NEUTROABS 12.5*  --   --   --   --   --   HGB 14.3 10.8* 9.6* 11.1* 14.1 13.8  HCT 43.4 32.9* 30.2* 35.0*  43.1 42.3  MCV 97.1 97.3 97.7 98.3 98.2 98.8  PLT 160 118* 125* 174 209 203   Cardiac Enzymes:  Recent Labs Lab 04/23/16 2001 04/24/16 0233 04/24/16 1826 04/25/16 0000 04/25/16 1051  CKTOTAL 201  --   --   --   --   TROPONINI  --  10.54* 4.10* 3.17* 2.03*   BNP (last 3 results) No results for input(s): PROBNP in the last 8760 hours. CBG:  Recent Labs Lab 04/26/16 2322 04/27/16 0310 04/27/16 0827 04/27/16 1150 04/27/16 1621  GLUCAP 99 89 94 78 83   Sepsis Labs:  Recent Labs Lab 04/23/16 2015  04/26/16 0422 04/27/16 0345 04/28/16 0945 04/29/16 0536  WBC  --   < > 7.6 7.7 8.5 9.3  LATICACIDVEN 1.51  --   --   --   --   --   < > = values in this interval not displayed.  Microbiology Recent Results (from the past 240 hour(s))  Blood Culture (routine x 2)     Status: None   Collection Time: 04/23/16  7:45 PM  Result Value Ref Range Status   Specimen Description BLOOD LEFT ANTECUBITAL  Final   Special Requests BOTTLES DRAWN AEROBIC AND ANAEROBIC 5CC  Final   Culture NO GROWTH 5 DAYS  Final   Report Status 04/28/2016 FINAL  Final  Blood Culture (routine x 2)     Status: None   Collection Time: 04/23/16  8:01 PM  Result Value Ref Range Status   Specimen Description BLOOD LEFT HAND  Final    Special Requests IN PEDIATRIC BOTTLE  3CC  Final   Culture NO GROWTH 5 DAYS  Final   Report Status 04/28/2016 FINAL  Final  Urine culture     Status: None   Collection Time: 04/23/16 11:59 PM  Result Value Ref Range Status   Specimen Description URINE, RANDOM  Final   Special Requests NONE  Final   Culture NO GROWTH  Final   Report Status 04/25/2016 FINAL  Final  MRSA PCR Screening     Status: None   Collection Time: 04/24/16 12:02 AM  Result Value Ref Range Status   MRSA by PCR NEGATIVE NEGATIVE Final    Comment:        The GeneXpert MRSA Assay (FDA approved for NASAL specimens only), is one component of a comprehensive MRSA colonization surveillance program. It is not intended to diagnose MRSA infection nor to guide or monitor treatment for MRSA infections.   C difficile quick scan w PCR reflex     Status: None   Collection Time: 04/24/16  8:03 AM  Result Value Ref Range Status   C Diff antigen NEGATIVE NEGATIVE Final   C Diff toxin NEGATIVE NEGATIVE Final   C Diff interpretation No C. difficile detected.  Final    Radiology: No results found.  Medications:   . aspirin EC  81 mg Oral Daily  . cefTAZidime (FORTAZ)  IV  2 g Intravenous Q8H  . furosemide  40 mg Oral Daily  . heparin subcutaneous  5,000 Units Subcutaneous Q8H  . levETIRAcetam  750 mg Oral BID  . levothyroxine  150 mcg Oral QAC breakfast  . losartan  50 mg Oral Daily  . mouth rinse  15 mL Mouth Rinse BID  . pantoprazole  40 mg Oral QHS  . potassium chloride  20 mEq Oral Daily   Continuous Infusions:  Medical decision making is of high complexity and this patient is at high risk of  deterioration, therefore this is a level 3 visit.  (> 4 problem points, 1 data point, Mod risk)    LOS: 6 days   Misk Galentine  [facility] Pager [phone]. If unable to reach me by pager, please call my cell phone at [phone].  *Please refer to [email or web address], password [email or web address] to get updated schedule on who will  round on this patient, as hospitalists switch teams weekly. If 7PM-7AM, please contact night-coverage at [email or web address], password [email or web address] for any overnight needs.  04/29/2016, 8:59 AM

## 2016-04-29 NOTE — Consult Note (Signed)
[place] Nurse wound consult note Reason for Consult: Areas of tissue loss (partial thickness) in the inframammary, subpannicular and bilateral inguinal areas secondary to moisture associated skin damage (MASD), specifically intertriginous dermatitis (ITD). Fungal overgrowth is suspected in the subpanniular area. Wound type: MASD, specifically ITD Pressure Injury POA: No Measurement: Areas of partial thickness tissue loss are primarily in the subpannicular area, maceration presents in the inframammary and bilateral inguinal areas. Wound bed: Moist, musty odor noted, scant serous exudate in pannus.  Moist, with must odor in inframammary and inguinal areas. Drainage (amount, consistency, odor) As described above Periwound: Macerated Dressing procedure/placement/frequency: Nursing is provided with guidance via the Orders for cleansing and thoroughly drying the affected areas twice daily, then placing our house antimicrobial textile, InterDry Ag+. If you agree, a one time does of systemic antifungal (eg. Diflucan) is suggested.  The silver component of the antimicrobial textile will resolve the fungal overgrowth eventually; a more expeditious measure would be a one-time does of the antifungal. Patient is on a bariatric therapeutic mattress with low air loss feature, orders provided for turning side to side and minimizing time spent in the supine position.  Bilateral pressure redistribution heel boots are provided today for prevention of heel pressure injuries. [place] nursing team will not follow, but will remain available to this patient, the nursing and medical teams.  Please re-consult if needed. Thank you for this consultation. Maudie Flakes, MSN, RN, [place], Arther Abbott  Pager# [phone]

## 2016-04-29 NOTE — Progress Notes (Signed)
Pt's HR in 150s, per Tele Pt in SVT. Non sustained, HR returned to 80s, SR. MD notified of HR and BP. Per Dr. Rockne Menghini this is ongoing and Cardio following. Per Dr Rockne Menghini if similar event occurs again, call/page Cardiology. Will continue to monitor.

## 2016-04-29 NOTE — Progress Notes (Addendum)
Patient Name: Jillian Eaton      SUBJECTIVE:  Seen today for runs of atrial tachycardia   She does not recall palpitations prior to her extubation.    BB withheld because of concerns of heart block  ( [facility] note 3/8)  Tele reviewed and there is atrial tachwith variable conduction but I dont see heart block in sinus rhythm  Has systolic HR  EF 67-20% and recent extubation from respiratory failure 2/2 /aspiration   + TN thought 2/2 demand ischemia  Morbid obesity BMI >60; at home is somewhat ambulatory. No history of syncope  Past Medical History:  Diagnosis Date  . Anemia   . Asthma   . Chronic headache   . DOE (dyspnea on exertion)    2D ECHO, 02/12/2012 - EF 60-65%, moderate concentric hypertrophy  . Fibromyalgia    nerve pain"left side at waist level" "can't lay on that side without pain" , "HOB elevation helps"  . Fibromyalgia   . Heart murmur   . Hematuria - cause not known   . History of kidney stones    x 2 '13, '14 surgery to remove  . Hypertension   . SBO (small bowel obstruction) 06/07/2013  . SOB (shortness of breath)    NUCLEAR STRESS TEST, 03/02/2009 - no ischemic ST changes or symptoms  . Thyroid disease    "goiter"  . Transfusion history    10 yrs+  . Urinary incontinence, functional     Scheduled Meds:  Scheduled Meds: . aspirin EC  81 mg Oral Daily  . cefTAZidime (FORTAZ)  IV  2 g Intravenous Q8H  . furosemide  40 mg Oral Daily  . heparin subcutaneous  5,000 Units Subcutaneous Q8H  . levETIRAcetam  750 mg Oral BID  . levothyroxine  150 mcg Oral QAC breakfast  . losartan  50 mg Oral Daily  . mouth rinse  15 mL Mouth Rinse BID  . pantoprazole  40 mg Oral QHS  . potassium chloride  20 mEq Oral BID   Continuous Infusions: sodium chloride, acetaminophen, levalbuterol    PHYSICAL EXAM Vitals:   04/28/16 2233 04/29/16 0000 04/29/16 0510 04/29/16 0647  BP: 133/88   (!) 123/52  Pulse: 84 83  75  Resp: 18 18  18   Temp: 98.2 F  (36.8 C)   98.2 F (36.8 C)  TempSrc: Oral   Oral  SpO2: 97% 93%  98%  Weight:   (!) 312 lb 9.6 oz (141.8 kg)   Height:        Well developed and nourished wearing O2Morbidly obese  HENT normal Neck supple  Clear Regular rate and rhythm, no murmurs or gallops Abd-soft with active BS No Clubbing cyanosis edema Skin-warm and dry A & Oriented  Grossly normal sensory and motor function    TELEMETRY: Reviewed personnlly pt in *As above   ECG personally reviewed  Sinus  17/07/46 with  T wave inversions 1,L   Intake/Output Summary (Last 24 hours) at 04/29/16 1611 Last data filed at 04/29/16 0510  Gross per 24 hour  Intake                0 ml  Output             1575 ml  Net            -1575 ml    LABS: Basic Metabolic Panel:  Recent Labs Lab 04/23/16 2001  04/25/16 0250 04/25/16 1816 04/26/16 0422 04/27/16  0345 04/28/16 0945 04/29/16 0536  NA 137  --  140  --  142 145 141 143  K 3.2*  --  2.9*  --  3.5 3.6 3.6 3.5  CL 98*  --  105  --  107 103 101 97*  CO2 24  --  27  --  30 31 28  32  GLUCOSE 126*  --  112*  --  94 92 121* 85  BUN 11  --  24*  --  25* 19 16 15   CREATININE 1.13*  --  1.14*  --  0.89 0.92 0.95 0.96  CALCIUM 8.2*  --  7.1*  --  7.2* 7.7* 8.7* 8.3*  MG 1.2*  < > 1.9 1.8  --   --   --   --   PHOS  --   < > 3.5 3.6  --   --   --   --   < > = values in this interval not displayed. Cardiac Enzymes: No results for input(s): CKTOTAL, CKMB, CKMBINDEX, TROPONINI in the last 72 hours. CBC:  Recent Labs Lab 04/23/16 2001 04/25/16 0250 04/26/16 0422 04/27/16 0345 04/28/16 0945 04/29/16 0536  WBC 14.6* 10.5 7.6 7.7 8.5 9.3  NEUTROABS 12.5*  --   --   --   --   --   HGB 14.3 10.8* 9.6* 11.1* 14.1 13.8  HCT 43.4 32.9* 30.2* 35.0* 43.1 42.3  MCV 97.1 97.3 97.7 98.3 98.2 98.8  PLT 160 118* 125* 174 209 203   PROTIME: No results for input(s): LABPROT, INR in the last 72 hours. Liver Function Tests: No results for input(s): AST, ALT, ALKPHOS,  BILITOT, PROT, ALBUMIN in the last 72 hours. No results for input(s): LIPASE, AMYLASE in the last 72 hours. BNP: BNP (last 3 results)  Recent Labs  03/10/16 0346  BNP 47.8    ProBNP (last 3 results) No results for input(s): PROBNP in the last 8760 hours.  D-Dimer: No results for input(s): DDIMER in the last 72 hours. Hemoglobin A1C: No results for input(s): HGBA1C in the last 72 hours. Fasting Lipid Panel: No results for input(s): CHOL, HDL, LDLCALC, TRIG, CHOLHDL, LDLDIRECT in the last 72 hours. Thyroid Function Tests:  ASSESSMENT AND PLAN:    Atrial tachycardia  Cardiomyopathy, presumed nonischemic  Morbidly obese   Resp failure now extubated   She has frequent runs of atrial tachycardia with variable conduction-1:1 as well as less. Atrial cycle length is about 400 ms. This should not be fast enough to affect left atrial appendage function so anticoagulation at least at this juncture is not indicated  I do not see the heart block that was described in Dr. Antionette Char note. I do see normal conduction as outlined above. Given her cardiomyopathy will begin her on beta blockers. I will start her on carvedilol. If we need an antiarrhythmic, amiodarone is probably only realistic option. Given her size, I do not think she is a candidate for ablation  Her cardiomyopathy is acute. LV function was normal 5/17. I wonder if it's not related to her acute illness and would hope that there'll be rapid recovery  Signed, Virl Axe MD  04/29/2016

## 2016-04-30 LAB — CBC
HCT: 44.2 % (ref 36.0–46.0)
Hemoglobin: 14.6 g/dL (ref 12.0–15.0)
MCH: 32.2 pg (ref 26.0–34.0)
MCHC: 33 g/dL (ref 30.0–36.0)
MCV: 97.6 fL (ref 78.0–100.0)
Platelets: 221 10*3/uL (ref 150–400)
RBC: 4.53 MIL/uL (ref 3.87–5.11)
RDW: 13.9 % (ref 11.5–15.5)
WBC: 8.7 10*3/uL (ref 4.0–10.5)

## 2016-04-30 MED ORDER — PNEUMOCOCCAL VAC POLYVALENT 25 MCG/0.5ML IJ INJ
0.5000 mL | INJECTION | INTRAMUSCULAR | Status: AC
  Administered 2016-05-01: 0.5 mL via INTRAMUSCULAR
  Filled 2016-04-30: qty 0.5

## 2016-04-30 MED ORDER — LOSARTAN POTASSIUM 50 MG PO TABS
25.0000 mg | ORAL_TABLET | Freq: Every day | ORAL | Status: DC
Start: 2016-05-01 — End: 2016-04-30

## 2016-04-30 MED ORDER — CARVEDILOL 3.125 MG PO TABS
3.1250 mg | ORAL_TABLET | Freq: Two times a day (BID) | ORAL | Status: DC
  Administered 2016-04-30 – 2016-05-01 (×2): 3.125 mg via ORAL
  Filled 2016-04-30 (×2): qty 1

## 2016-04-30 MED ORDER — FLUCONAZOLE 100 MG PO TABS
200.0000 mg | ORAL_TABLET | Freq: Once | ORAL | Status: AC
  Administered 2016-04-30: 200 mg via ORAL
  Filled 2016-04-30: qty 2

## 2016-04-30 NOTE — Progress Notes (Addendum)
Patient Name: Jillian Eaton      SUBJECTIVE: Morbidly obese woman having been admitted with respiratory arrest presumed aspiration pneumonia. Positive troponins. thought 2/2 demand ischemia Has systolic HR  EF 00-93%   8/18 EF was normal BB withheld because of concerns of heart block  ( [facility] note 3/8)  Tele reviewed and there is atrial tachwith variable conduction but I dont see heart block in sinus rhythm  Many fewer palpitations today  I thought I had written the order for carvedilol yesterday. Apparently I. Did not       Morbid obesity BMI >60; at home is somewhat ambulatory. No history of syncope  Past Medical History:  Diagnosis Date  . Anemia   . Asthma   . Chronic headache   . DOE (dyspnea on exertion)    2D ECHO, 02/12/2012 - EF 60-65%, moderate concentric hypertrophy  . Fibromyalgia    nerve pain"left side at waist level" "can't lay on that side without pain" , "HOB elevation helps"  . Fibromyalgia   . Heart murmur   . Hematuria - cause not known   . History of kidney stones    x 2 '13, '14 surgery to remove  . Hypertension   . SBO (small bowel obstruction) 06/07/2013  . SOB (shortness of breath)    NUCLEAR STRESS TEST, 03/02/2009 - no ischemic ST changes or symptoms  . Thyroid disease    "goiter"  . Transfusion history    10 yrs+  . Urinary incontinence, functional     Scheduled Meds:  Scheduled Meds: . aspirin EC  81 mg Oral Daily  . furosemide  40 mg Oral Daily  . heparin subcutaneous  5,000 Units Subcutaneous Q8H  . levETIRAcetam  750 mg Oral BID  . levothyroxine  150 mcg Oral QAC breakfast  . losartan  50 mg Oral Daily  . mouth rinse  15 mL Mouth Rinse BID  . pantoprazole  40 mg Oral QHS  . [START ON 05/01/2016] pneumococcal 23 valent vaccine  0.5 mL Intramuscular Tomorrow-1000  . potassium chloride  20 mEq Oral BID   Continuous Infusions: sodium chloride, acetaminophen, levalbuterol    PHYSICAL EXAM Vitals:   04/29/16 1717  04/29/16 2320 04/30/16 0345 04/30/16 0752  BP: (!) 95/41 112/84 120/73   Pulse: 85 71 86   Resp: 20 18 20    Temp: 97.9 F (36.6 C) 98 F (36.7 C) 98.4 F (36.9 C)   TempSrc: Oral Oral Oral   SpO2: 97% 98% 96%   Weight:    (!) 313 lb (142 kg)  Height:        Well developed  As wearing O2Morbidly obese  HENT normal Neck supple  Clear Regular rate and rhythm, no murmurs or gallops Abd-soft with active BS No Clubbing cyanosis edema Skin-warm and dry A & Oriented  Grossly normal sensory and motor function    TELEMETRY: Reviewed personnlly pt in  Sinus rhythm with poor fewer episodes of atrial tachycardia  ECG personally reviewed  Sinus  17/07/46 with  T wave inversions 1,L   Intake/Output Summary (Last 24 hours) at 04/30/16 1034 Last data filed at 04/29/16 1615  Gross per 24 hour  Intake                0 ml  Output             1250 ml  Net            -1250 ml  LABS: Basic Metabolic Panel:  Recent Labs Lab 04/23/16 2001  04/25/16 0250 04/25/16 1816 04/26/16 0422 04/27/16 0345 04/28/16 0945 04/29/16 0536  NA 137  --  140  --  142 145 141 143  K 3.2*  --  2.9*  --  3.5 3.6 3.6 3.5  CL 98*  --  105  --  107 103 101 97*  CO2 24  --  27  --  30 31 28  32  GLUCOSE 126*  --  112*  --  94 92 121* 85  BUN 11  --  24*  --  25* 19 16 15   CREATININE 1.13*  --  1.14*  --  0.89 0.92 0.95 0.96  CALCIUM 8.2*  --  7.1*  --  7.2* 7.7* 8.7* 8.3*  MG 1.2*  < > 1.9 1.8  --   --   --   --   PHOS  --   < > 3.5 3.6  --   --   --   --   < > = values in this interval not displayed. Cardiac Enzymes: No results for input(s): CKTOTAL, CKMB, CKMBINDEX, TROPONINI in the last 72 hours. CBC:  Recent Labs Lab 04/23/16 2001 04/25/16 0250 04/26/16 0422 04/27/16 0345 04/28/16 0945 04/29/16 0536 04/30/16 0736  WBC 14.6* 10.5 7.6 7.7 8.5 9.3 8.7  NEUTROABS 12.5*  --   --   --   --   --   --   HGB 14.3 10.8* 9.6* 11.1* 14.1 13.8 14.6  HCT 43.4 32.9* 30.2* 35.0* 43.1 42.3 44.2   MCV 97.1 97.3 97.7 98.3 98.2 98.8 97.6  PLT 160 118* 125* 174 209 203 221   PROTIME: No results for input(s): LABPROT, INR in the last 72 hours. Liver Function Tests: No results for input(s): AST, ALT, ALKPHOS, BILITOT, PROT, ALBUMIN in the last 72 hours. No results for input(s): LIPASE, AMYLASE in the last 72 hours. BNP: BNP (last 3 results)  Recent Labs  03/10/16 0346  BNP 47.8    ProBNP (last 3 results) No results for input(s): PROBNP in the last 8760 hours.  D-Dimer: No results for input(s): DDIMER in the last 72 hours. Hemoglobin A1C: No results for input(s): HGBA1C in the last 72 hours. Fasting Lipid Panel: No results for input(s): CHOL, HDL, LDLCALC, TRIG, CHOLHDL, LDLDIRECT in the last 72 hours. Thyroid Function Tests:  ASSESSMENT AND PLAN:    Atrial tachycardia  Cardiomyopathy, presumed nonischemic  Morbidly obese   Resp failure now extubated   Fewer episodes of atrial tachycardia Blood pressure was low  we will Discontinue the losartan  and initiate carvedilol 3.125 I hope that her acute cardiomyopathy will resolve in the short-term   Signed, Virl Axe MD  04/30/2016

## 2016-04-30 NOTE — Progress Notes (Signed)
Progress Note    MARET DIAZ  ZOX:[id number] DOB: [birth year]  DOA: 04/23/2016 PCP: Mauricia Area, MD    Brief Narrative:   Chief complaint: Follow-up aspiration pneumonia and seizures  Medical records reviewed and are as summarized below.  Jillian Eaton is an 66 y.o. female with a PMH of restrictive lung disease secondary to morbid obesity status post gastroplasty in 1985, asthma, chronic pain syndrome on chronic narcotic therapy (history of unintentional narcotic overdose), fibromyalgia, hypothyroidism, chronic diastolic CHF who was admitted 09/20/17 by the critical care team after she was found down and unresponsive. She was intubated by EMS prior to transport to the hospital. She was given Versed for suspected seizure activity, and was incontinent of bowels and bladder. Her fever was 103 and she had leukocytosis with a left shift on initial presentation. She was started on vancomycin/aztreonam/Levaquin. Initial chest x-ray showed findings concerning for aspiration. She was successfully extubated 04/26/16 and her care was transferred to [facility] on 04/28/16.  Assessment/Plan:   Principal Problem:   Acute on chronic respiratory failure with hypoxemia (HCC)/aspiration pneumonia/focal cord dysfunction/pulmonary hypertension Patient was admitted by the critical care team and required intubation. Extubated 04/26/16. Placed on broad-spectrum antibiotics including Azactam, vancomycin, Levaquin, and cefepime. Completed a 7 day course of abx with Fortaz and vancomycin. Blood, urine cultures negative. Influenza panel negative. Continue bronchodilators. Respiratory status stable.  Active Problems:   Super Morbid Obesity (BMI >60) Will need CIR vs SNF for rehabilitation. Sedentary prior to admission. Body mass index is 48.5 kg/m.    Essential hypertension Cozaar discontinued, now on carvedilol 3.125 twice a day per cardiology.    Hypothyroidism Continue Synthroid. TSH 9.239.    Toxic  metabolic encephalopathy/seizures Evaluated by neurologist. CT of the head negative. EEG done 04/24/16 and showed diffuse slowing with rhythmic sharp waves in the left parietal region. Currently on Keppra. No evidence of ongoing seizure activity. Neurology recommends weaning Keppra after acute illness resolved.    Atrial fibrillation (HCC)/ectopic atrial tachycardia Cardiology consulted and patient placed on a heparin drip. Currently on subcutaneous heparin as she is not a good candidate for long-term anticoagulation. Continue aspirin. Continues to have bursts of tachycardic activity. Evaluated by Dr. Graciela Husbands who has placed her on Coreg. Does not feel she is a good candidate for ablation.    Elevated troponin/non-ST elevation MI/acute systolic CHF 2-D echo showed EF 25-30 percent with mild MR and mild LA dilatation. Continue aspirin and Lasix. Avoid beta blockers in the setting of AV block. Noninvasive imaging not felt to be feasible due to her super morbid obesity. Medical management recommended by cardiology. Recommends repeat echocardiogram in 3 months to assess for recovery of LV function.    Thrombocytopenia (HCC) Resolved.    Diarrhea Resolving, rectal tube now out. C. difficile studies were negative.    Hypokalemia On routine supplementation.    Anemia of critical illness and chronic disease No current indication for transfusion.    Dysphagia Evaluated by speech therapy. Continue dysphagia 2 diet.  HIV screening Screening done 04/23/16: Negative.    Intertriginous dermatitis Likely fungal. We'll give a dose of Diflucan 1 and continue skin care recommendations per wound care nurse with house antimicrobial textile, InterDry Ag+.    Family Communication/Anticipated D/C date and plan/Code Status   DVT prophylaxis: Heparin ordered. Code Status: Full Code.  Family Communication: Husband at the bedside 04/29/16. Disposition Plan: CIR vs SNF   Medical Consultants:     Cardiology  Pulmonology  Neurology  CIR   Procedures:    EEG 04/24/16  2-D echocardiogram 04/24/16  Anti-Infectives:    Vancomycin 3/04 >>3/10  Azactam 3/04 >> 3/05   Levaquin 3/04 >> 3/05   Cefepime 3/05 >> 3/07  Elita Quick 3/07 >>3/10  Diflucan 13/11/8  Subjective:   Continues to report that she feels weak and that she continues to have some shortness of breath. Diarrhea resolved. No nausea or vomiting.  Objective:    Vitals:   04/29/16 1717 04/29/16 2320 04/30/16 0345 04/30/16 0752  BP: (!) 95/41 112/84 120/73   Pulse: 85 71 86   Resp: 20 18 20    Temp: 97.9 F (36.6 C) 98 F (36.7 C) 98.4 F (36.9 C)   TempSrc: Oral Oral Oral   SpO2: 97% 98% 96%   Weight:    (!) 142 kg (313 lb)  Height:        Intake/Output Summary (Last 24 hours) at 04/30/16 0849 Last data filed at 04/29/16 1615  Gross per 24 hour  Intake                0 ml  Output             1250 ml  Net            -1250 ml   Filed Weights   04/27/16 0829 04/29/16 0510 04/30/16 0752  Weight: (!) 144.7 kg (319 lb) (!) 141.8 kg (312 lb 9.6 oz) (!) 142 kg (313 lb)    Exam: General exam: Appears calm and comfortable. Morbidly obese. Respiratory system: Diminished but clear. Respiratory effort normal. Cardiovascular system: S1 & S2 heard, RRR. No JVD,  rubs, gallops or clicks. No murmurs. Gastrointestinal system: Abdomen is nondistended, soft and nontender. No organomegaly or masses felt. Normal bowel sounds heard. Central nervous system: Alert and oriented x 2. No focal neurological deficits. Generalized weakness. Extremities: No clubbing,  or cyanosis. 1+ edema. Skin: No rashes, lesions or ulcers. Psychiatry: Judgement and insight appear fair. Mood & affect appropriate.   Data Reviewed:   I have personally reviewed following labs and imaging studies:  Labs: Basic Metabolic Panel:  Recent Labs Lab 04/23/16 2001 04/24/16 1826 04/25/16 0250 04/25/16 1816 04/26/16 0422  04/27/16 0345 04/28/16 0945 04/29/16 0536  NA 137  --  140  --  142 145 141 143  K 3.2*  --  2.9*  --  3.5 3.6 3.6 3.5  CL 98*  --  105  --  107 103 101 97*  CO2 24  --  27  --  30 31 28  32  GLUCOSE 126*  --  112*  --  94 92 121* 85  BUN 11  --  24*  --  25* 19 16 15   CREATININE 1.13*  --  1.14*  --  0.89 0.92 0.95 0.96  CALCIUM 8.2*  --  7.1*  --  7.2* 7.7* 8.7* 8.3*  MG 1.2* 2.0 1.9 1.8  --   --   --   --   PHOS  --  3.3 3.5 3.6  --   --   --   --    GFR Estimated Creatinine Clearance: 90.1 mL/min (by C-G formula based on SCr of 0.96 mg/dL). Liver Function Tests:  Recent Labs Lab 04/23/16 2001  AST 34  ALT 14  ALKPHOS 90  BILITOT 0.9  PROT 7.5  ALBUMIN 3.8   CBC:  Recent Labs Lab 04/23/16 2001  04/26/16 0422 04/27/16 0345 04/28/16 0945 04/29/16 0536 04/30/16 5638  WBC 14.6*  < > 7.6 7.7 8.5 9.3 8.7  NEUTROABS 12.5*  --   --   --   --   --   --   HGB 14.3  < > 9.6* 11.1* 14.1 13.8 14.6  HCT 43.4  < > 30.2* 35.0* 43.1 42.3 44.2  MCV 97.1  < > 97.7 98.3 98.2 98.8 97.6  PLT 160  < > 125* 174 209 203 221  < > = values in this interval not displayed. Cardiac Enzymes:  Recent Labs Lab 04/23/16 2001 04/24/16 0233 04/24/16 1826 04/25/16 0000 04/25/16 1051  CKTOTAL 201  --   --   --   --   TROPONINI  --  10.54* 4.10* 3.17* 2.03*   CBG:  Recent Labs Lab 04/26/16 2322 04/27/16 0310 04/27/16 0827 04/27/16 1150 04/27/16 1621  GLUCAP 99 89 94 78 83   Sepsis Labs:  Recent Labs Lab 04/23/16 2015  04/27/16 0345 04/28/16 0945 04/29/16 0536 04/30/16 0736  WBC  --   < > 7.7 8.5 9.3 8.7  LATICACIDVEN 1.51  --   --   --   --   --   < > = values in this interval not displayed.  Microbiology Recent Results (from the past 240 hour(s))  Blood Culture (routine x 2)     Status: None   Collection Time: 04/23/16  7:45 PM  Result Value Ref Range Status   Specimen Description BLOOD LEFT ANTECUBITAL  Final   Special Requests BOTTLES DRAWN AEROBIC AND ANAEROBIC  5CC  Final   Culture NO GROWTH 5 DAYS  Final   Report Status 04/28/2016 FINAL  Final  Blood Culture (routine x 2)     Status: None   Collection Time: 04/23/16  8:01 PM  Result Value Ref Range Status   Specimen Description BLOOD LEFT HAND  Final   Special Requests IN PEDIATRIC BOTTLE  3CC  Final   Culture NO GROWTH 5 DAYS  Final   Report Status 04/28/2016 FINAL  Final  Urine culture     Status: None   Collection Time: 04/23/16 11:59 PM  Result Value Ref Range Status   Specimen Description URINE, RANDOM  Final   Special Requests NONE  Final   Culture NO GROWTH  Final   Report Status 04/25/2016 FINAL  Final  MRSA PCR Screening     Status: None   Collection Time: 04/24/16 12:02 AM  Result Value Ref Range Status   MRSA by PCR NEGATIVE NEGATIVE Final    Comment:        The GeneXpert MRSA Assay (FDA approved for NASAL specimens only), is one component of a comprehensive MRSA colonization surveillance program. It is not intended to diagnose MRSA infection nor to guide or monitor treatment for MRSA infections.   C difficile quick scan w PCR reflex     Status: None   Collection Time: 04/24/16  8:03 AM  Result Value Ref Range Status   C Diff antigen NEGATIVE NEGATIVE Final   C Diff toxin NEGATIVE NEGATIVE Final   C Diff interpretation No C. difficile detected.  Final    Radiology: No results found.  Medications:   . aspirin EC  81 mg Oral Daily  . furosemide  40 mg Oral Daily  . heparin subcutaneous  5,000 Units Subcutaneous Q8H  . levETIRAcetam  750 mg Oral BID  . levothyroxine  150 mcg Oral QAC breakfast  . losartan  50 mg Oral Daily  . mouth rinse  15 mL  Mouth Rinse BID  . pantoprazole  40 mg Oral QHS  . [START ON 05/01/2016] pneumococcal 23 valent vaccine  0.5 mL Intramuscular Tomorrow-1000  . potassium chloride  20 mEq Oral BID   Continuous Infusions:  Medical decision making is of high complexity and this patient is at high risk of deterioration, therefore this is  a level 3 visit.  (> 4 problem points, 1 data point, Mod risk)    LOS: 7 days   Thailan Sava  [facility] Pager [phone]. If unable to reach me by pager, please call my cell phone at [phone].  *Please refer to [email or web address], password [email or web address] to get updated schedule on who will round on this patient, as hospitalists switch teams weekly. If 7PM-7AM, please contact night-coverage at [email or web address], password [email or web address] for any overnight needs.  04/30/2016, 8:49 AM

## 2016-05-01 DIAGNOSIS — I5043 Acute on chronic combined systolic (congestive) and diastolic (congestive) heart failure: Secondary | ICD-10-CM

## 2016-05-01 DIAGNOSIS — G894 Chronic pain syndrome: Secondary | ICD-10-CM

## 2016-05-01 DIAGNOSIS — I471 Supraventricular tachycardia: Secondary | ICD-10-CM

## 2016-05-01 DIAGNOSIS — A419 Sepsis, unspecified organism: Principal | ICD-10-CM

## 2016-05-01 DIAGNOSIS — J45909 Unspecified asthma, uncomplicated: Secondary | ICD-10-CM

## 2016-05-01 DIAGNOSIS — R1312 Dysphagia, oropharyngeal phase: Secondary | ICD-10-CM

## 2016-05-01 DIAGNOSIS — M797 Fibromyalgia: Secondary | ICD-10-CM

## 2016-05-01 DIAGNOSIS — I480 Paroxysmal atrial fibrillation: Secondary | ICD-10-CM

## 2016-05-01 DIAGNOSIS — I272 Pulmonary hypertension, unspecified: Secondary | ICD-10-CM

## 2016-05-01 DIAGNOSIS — I5031 Acute diastolic (congestive) heart failure: Secondary | ICD-10-CM

## 2016-05-01 DIAGNOSIS — E876 Hypokalemia: Secondary | ICD-10-CM

## 2016-05-01 DIAGNOSIS — T50901A Poisoning by unspecified drugs, medicaments and biological substances, accidental (unintentional), initial encounter: Secondary | ICD-10-CM

## 2016-05-01 DIAGNOSIS — T50901S Poisoning by unspecified drugs, medicaments and biological substances, accidental (unintentional), sequela: Secondary | ICD-10-CM

## 2016-05-01 DIAGNOSIS — R1310 Dysphagia, unspecified: Secondary | ICD-10-CM

## 2016-05-01 DIAGNOSIS — R569 Unspecified convulsions: Secondary | ICD-10-CM

## 2016-05-01 DIAGNOSIS — I10 Essential (primary) hypertension: Secondary | ICD-10-CM

## 2016-05-01 DIAGNOSIS — R748 Abnormal levels of other serum enzymes: Secondary | ICD-10-CM

## 2016-05-01 LAB — CBC
HEMATOCRIT: 40.7 % (ref 36.0–46.0)
Hemoglobin: 13.4 g/dL (ref 12.0–15.0)
MCH: 32.2 pg (ref 26.0–34.0)
MCHC: 32.9 g/dL (ref 30.0–36.0)
MCV: 97.8 fL (ref 78.0–100.0)
Platelets: 290 10*3/uL (ref 150–400)
RBC: 4.16 MIL/uL (ref 3.87–5.11)
RDW: 13.8 % (ref 11.5–15.5)
WBC: 10.2 10*3/uL (ref 4.0–10.5)

## 2016-05-01 MED ORDER — CARVEDILOL 6.25 MG PO TABS
6.2500 mg | ORAL_TABLET | Freq: Two times a day (BID) | ORAL | Status: DC
  Administered 2016-05-01 – 2016-05-05 (×8): 6.25 mg via ORAL
  Filled 2016-05-01 (×9): qty 1

## 2016-05-01 NOTE — Progress Notes (Signed)
If recurrent tachycardia would add amiodarone  With LV dysfunction would be really only choice; in the interim will increase carvedilol

## 2016-05-01 NOTE — Consult Note (Signed)
Physical Medicine and Rehabilitation Consult Reason for Consult: Seizure/encephalopathy/sepsis  Referring Physician: [facility]   HPI: Jillian Eaton is a 66 y.o.right handed  female with history of fibromyalgia with chronic pain syndrome, asthma, morbid obesity. Per chart review patient lives with spouse. Independent with assistive device prior to admission. Husband did assist with some lower body dressing. Two-level home with bedroom upstairs. Presented 04/23/2016 who was found down and unresponsive. Patient recently admitted in January 2018 for unintentional narcotic overdose and hypoxemic/hypercapneic respiratory failure and was discharged 03/12/2016. Patient required intubation by EMS in the field. There was question of seizure activity. Fever 103. Placed on broad-spectrum antibiotics. UDS positive opiates and benzodiazepines. Hypokalemia 3.2, creatinine 1.13. WBC 14,600. Troponin 0.87. Chest x-ray cardiomegaly with bilateral airspace opacities predominantly in the right upper lobe with a lesser degree of atelectasis versus infiltrate in both lung bases reflective of aspiration pneumonia. Cranial CT reviewed, unremarkable for acute process. Neurology consulted for possible seizure activity and loaded with Keppra. EEG showed moderate diffuse slowing of the background, no seizure activity. Cardiology service is consulted for elevated troponin felt related to demand ischemia and maintained on medical therapy. Echocardiogram with ejection fraction of 30%. Systolic function severely reduced. No defect or PFO identified. Patient was extubated 04/26/2016. Subcutaneous heparin for DVT prophylaxis. Dysphagia #2 thin liquid diet. Physical and occupational therapy evaluations completed with recommendations of physical medicine rehabilitation consult.   Review of Systems  Constitutional: Negative for chills.  HENT: Negative for hearing loss and tinnitus.   Eyes: Negative for blurred vision and double  vision.  Respiratory: Positive for shortness of breath. Negative for cough.   Cardiovascular: Positive for leg swelling. Negative for chest pain.  Gastrointestinal: Positive for diarrhea. Negative for nausea and vomiting.  Genitourinary: Negative for dysuria and flank pain.       Intervention bouts of stress incontinence  Musculoskeletal: Positive for back pain, joint pain and myalgias.  Skin: Negative for rash.  Neurological: Positive for weakness and headaches.  All other systems reviewed and are negative.  Past Medical History:  Diagnosis Date  . Anemia   . Asthma   . Chronic headache   . DOE (dyspnea on exertion)    2D ECHO, 02/12/2012 - EF 60-65%, moderate concentric hypertrophy  . Fibromyalgia    nerve pain"left side at waist level" "can't lay on that side without pain" , "HOB elevation helps"  . Fibromyalgia   . Heart murmur   . Hematuria - cause not known   . History of kidney stones    x 2 '13, '14 surgery to remove  . Hypertension   . SBO (small bowel obstruction) 06/07/2013  . SOB (shortness of breath)    NUCLEAR STRESS TEST, 03/02/2009 - no ischemic ST changes or symptoms  . Thyroid disease    "goiter"  . Transfusion history    10 yrs+  . Urinary incontinence, functional    Past Surgical History:  Procedure Laterality Date  . CARDIAC CATHETERIZATION  04/04/2010   No significant obstructive coronary artery disease  . CHOLECYSTECTOMY  1990  . COLONOSCOPY W/ POLYPECTOMY    . COLONOSCOPY WITH PROPOFOL N/A 04/10/2014   Procedure: COLONOSCOPY WITH PROPOFOL;  Surgeon: Beryle Beams, MD;  Location: [facility];  Service: Endoscopy;  Laterality: N/A;  . DIAGNOSTIC LAPAROSCOPY     x2 bowel obstructions(adhesions)  . 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 THYROIDECTOMY    . TUBAL  LIGATION  1986   Family History  Problem Relation Age of Onset  . Diabetes Mother   . Epilepsy Mother   . Cancer Mother     Breast  .  Hypertension Mother   . Breast cancer Mother   . Kidney disease Father   . Diabetes Father   . Hypertension Father   . Asthma Father   . Heart disease Father   . Epilepsy Sister   . Cancer Maternal Grandmother   . Breast cancer Maternal Grandmother   . Cancer Paternal Grandmother   . Breast cancer Paternal Grandmother    Social History:  reports that she has never smoked. She has never used smokeless tobacco. She reports that she uses drugs, including Oxycodone. She reports that she does not drink alcohol. Allergies:  Allergies  Allergen Reactions  . Penicillins Other (See Comments)    intolerance   Medications Prior to Admission  Medication Sig Dispense Refill  . amLODipine (NORVASC) 5 MG tablet Take 5 mg by mouth daily.  1  . celecoxib (CELEBREX) 200 MG capsule Take 200 mg by mouth every morning.     . cyclobenzaprine (FLEXERIL) 10 MG tablet Take 1 tablet (10 mg total) by mouth 3 (three) times daily as needed for muscle spasms. 30 tablet 0  . DULoxetine (CYMBALTA) 60 MG capsule Take 60 mg by mouth every evening.     . Eszopiclone (ESZOPICLONE) 3 MG TABS Take 1 tablet (3 mg total) by mouth at bedtime as needed. Take immediately before bedtime (Patient taking differently: Take 3 mg by mouth at bedtime. Take immediately before bedtime )  0  . furosemide (LASIX) 20 MG tablet Take 1 tablet (20 mg total) by mouth daily. 30 tablet 0  . levothyroxine (SYNTHROID, LEVOTHROID) 150 MCG tablet Take 2 tablets (300 mcg total) by mouth daily before breakfast. 60 tablet 0  . losartan (COZAAR) 100 MG tablet Take 100 mg by mouth every morning.     . mirabegron ER (MYRBETRIQ) 50 MG TB24 Take 50 mg by mouth every morning.     . Oxycodone HCl 10 MG TABS Take 10 mg by mouth See admin instructions. Take 10 mg by mouth up to 3 times daily as needed for up to 15 days    . pantoprazole (PROTONIX) 40 MG tablet Take 1 tablet (40 mg total) by mouth at bedtime. 30 tablet 0  . pregabalin (LYRICA) 200 MG capsule  Take 200 mg by mouth 3 (three) times daily.      . Vitamin D, Ergocalciferol, (DRISDOL) 50000 units CAPS capsule Take 50,000 Units by mouth every 7 (seven) days.    Marland Kitchen acetaminophen (TYLENOL) 650 MG CR tablet Take 650 mg by mouth every 6 (six) hours.    Marland Kitchen albuterol (PROAIR HFA) 108 (90 BASE) MCG/ACT inhaler Inhale 2 puffs into the lungs every 6 (six) hours as needed for wheezing or shortness of breath.     . budesonide-formoterol (SYMBICORT) 160-4.5 MCG/ACT inhaler Inhale 2 puffs into the lungs 2 (two) times daily as needed (shortness of breath.).     Marland Kitchen calcium carbonate (CALCIUM 600) 600 MG TABS tablet Take 600 mg by mouth 2 (two) times daily with a meal.    . oxyCODONE (OXY IR/ROXICODONE) 5 MG immediate release tablet Take 1 tablet (5 mg total) by mouth every 6 (six) hours as needed for moderate pain. (Patient not taking: Reported on 04/24/2016) 20 tablet 0  . [DISCONTINUED] acetaminophen (TYLENOL) 500 MG tablet Take 500 mg by mouth every 6 (  six) hours as needed for mild pain.      Home: Home Living Family/patient expects to be discharged to:: Private residence Living Arrangements: Spouse/significant other Available Help at Discharge: Family, Available 24 hours/day Type of Home: House Home Access: Stairs to enter [employer] of Steps: 1 Entrance Stairs-Rails: None Home Layout: Two level, Bed/bath upstairs Alternate Level Stairs-Number of Steps: 7 Alternate Level Stairs-Rails: Right Bathroom Shower/Tub: Tub/shower unit, Curtain Bathroom Toilet: Handicapped height Home Equipment: Tub bench, Walker - 2 wheels, Bedside commode Additional Comments: Difficulty getting upstairs to bedroom.  Functional History: Prior Function Level of Independence: Independent with assistive device(s), Needs assistance Gait / Transfers Assistance Needed: Used walker ADL's / Homemaking Assistance Needed: Able to get bathed with effort, and dressed but occasionally husband assisted with LB  dressing Functional Status:  Mobility: Bed Mobility Overal bed mobility: Needs Assistance Bed Mobility: Supine to Sit, Sit to Supine, Sit to Sidelying Supine to sit: HOB elevated Sit to supine: Mod assist Sit to sidelying: Mod assist General bed mobility comments: Pt requires assist for LEs  Transfers Overall transfer level: Needs assistance Equipment used: None Transfers: Lateral/Scoot Transfers  Lateral/Scoot Transfers: +2 physical assistance, Max assist, Total assist General transfer comment: Not attempted due to fatigue       ADL: ADL Overall ADL's : Needs assistance/impaired Eating/Feeding: Modified independent, Bed level Grooming: Wash/dry hands, Wash/dry face, Oral care, Brushing hair, Sitting, Bed level, Set up, Min guard Upper Body Bathing: Moderate assistance, Sitting, Bed level Lower Body Bathing: Maximal assistance, Bed level Upper Body Dressing : Maximal assistance, Sitting, Bed level Lower Body Dressing: Total assistance, Bed level Toilet Transfer: Total assistance Toileting- Clothing Manipulation and Hygiene: Total assistance, Bed level Functional mobility during ADLs: Maximal assistance, Total assistance General ADL Comments: HR to 160 with EOB activity   Cognition: Cognition Overall Cognitive Status: Impaired/Different from baseline Orientation Level: Oriented X4 Cognition Arousal/Alertness: Awake/alert Behavior During Therapy: WFL for tasks assessed/performed Overall Cognitive Status: Impaired/Different from baseline Area of Impairment: Memory, Safety/judgement, Awareness, Problem solving, Attention Orientation Level:  (Pt using cues from room for orientation questions. ) Current Attention Level: Selective Memory: Decreased short-term memory Safety/Judgement: Decreased awareness of safety, Decreased awareness of deficits Awareness: Emergent, Intellectual Problem Solving: Slow processing, Decreased initiation, Difficulty sequencing, Requires verbal  cues, Requires tactile cues General Comments: Pt slow to respond.  She reports she just now realized Trump was preseident.   Verbal cues to attend to task   Blood pressure 104/76, pulse 78, temperature 97.8 F (36.6 C), temperature source Oral, resp. rate 18, height 5\' 8"  (1.727 m), weight (!) 142 kg (313 lb), SpO2 97 %. Physical Exam  Vitals reviewed. Constitutional: She appears well-developed.  66 year old right-handed morbidly obese female  HENT:  Head: Normocephalic and atraumatic.  Eyes: Conjunctivae and EOM are normal.  Neck: Normal range of motion. Neck supple. No thyromegaly present.  Cardiovascular: Normal rate and regular rhythm.   Respiratory: Effort normal and breath sounds normal. No respiratory distress.  GI: Soft. Bowel sounds are normal. She exhibits no distension. There is no tenderness.  Genitourinary:  Genitourinary Comments: +Rectal tube  Musculoskeletal: She exhibits edema. She exhibits no tenderness.  Neurological: She is alert.  Makes good eye contact with examiner.  Followed simple commands.  She had some delay in providing her age as well as appropriate year. She could name the presently [place]. She could not recall incident leading up to her hospital admission Sensation intact to light touch Motor: B/l UE 4+/5  B/l LE:  HF 2/5, KE 2+/5, ADF/PF 4/5  Skin: Skin is warm and dry.  Psychiatric: She has a normal mood and affect. Her behavior is normal.    Results for orders placed or performed during the hospital encounter of 04/23/16 (from the past 24 hour(s))  CBC     Status: None   Collection Time: 04/30/16  7:36 AM  Result Value Ref Range   WBC 8.7 4.0 - 10.5 K/uL   RBC 4.53 3.87 - 5.11 MIL/uL   Hemoglobin 14.6 12.0 - 15.0 g/dL   HCT 44.2 36.0 - 46.0 %   MCV 97.6 78.0 - 100.0 fL   MCH 32.2 26.0 - 34.0 pg   MCHC 33.0 30.0 - 36.0 g/dL   RDW 13.9 11.5 - 15.5 %   Platelets 221 150 - 400 K/uL  CBC     Status: None   Collection Time: 05/01/16  4:34  AM  Result Value Ref Range   WBC 10.2 4.0 - 10.5 K/uL   RBC 4.16 3.87 - 5.11 MIL/uL   Hemoglobin 13.4 12.0 - 15.0 g/dL   HCT 40.7 36.0 - 46.0 %   MCV 97.8 78.0 - 100.0 fL   MCH 32.2 26.0 - 34.0 pg   MCHC 32.9 30.0 - 36.0 g/dL   RDW 13.8 11.5 - 15.5 %   Platelets 290 150 - 400 K/uL   No results found.  Assessment/Plan: Diagnosis: Seizure/encephalopathy Labs and images independently reviewed.  Records reviewed and summated above.  1. Does the need for close, 24 hr/day medical supervision in concert with the patient's rehab needs make it unreasonable for this patient to be served in a less intensive setting? Yes  2. Co-Morbidities requiring supervision/potential complications: chronic pain syndrome (Biofeedback training with therapies to help reduce reliance on opiate pain medications, monitor pain control during therapies, and sedation at rest and titrate to maximum efficacy to ensure participation and gains in therapies), asthma (monitor RR and O2 sats with increased activity), morbid obesity (Body mass index is 47.6 kg/m., encourage weight loss to increase endurance and promote overall health), unintentional narcotic overdose and hypoxemic/hypercapneic respiratory failure, hypokalemia (continue to monitor and replete as necessary), Combined CHF with pulm HTN (Monitor in accordance with increased physical activity and avoid UE resistance exercises), Dysphagia (advance diet as tolerated) 3. Due to bladder management, bowel management, safety, skin/wound care, disease management, medication administration, pain management and patient education, does the patient require 24 hr/day rehab nursing? Yes 4. Does the patient require coordinated care of a physician, rehab nurse, PT (1-2 hrs/day, 5 days/week), OT (1-2 hrs/day, 5 days/week) and SLP (1-2 hrs/day, 5 days/week) to address physical and functional deficits in the context of the above medical diagnosis(es)? Yes Addressing deficits in the  following areas: balance, endurance, locomotion, strength, transferring, bowel/bladder control, bathing, dressing, toileting, cognition, swallowing and psychosocial support 5. Can the patient actively participate in an intensive therapy program of at least 3 hrs of therapy per day at least 5 days per week? Potentially 6. The potential for patient to make measurable gains while on inpatient rehab is excellent 7. Anticipated functional outcomes upon discharge from inpatient rehab are supervision and min assist  with PT, modified independent and supervision with OT, modified independent and supervision with SLP. 8. Estimated rehab length of stay to reach the above functional goals is: 17-21 days. 9. Does the patient have adequate social supports and living environment to accommodate these discharge functional goals? Potentially 10. Anticipated D/C setting: Home 11. Anticipated post D/C treatments: [facility]  therapy and Home excercise program 12. Overall Rehab/Functional Prognosis: good  RECOMMENDATIONS: This patient's condition is appropriate for continued rehabilitative care in the following setting: Will need to inquire about baseline level of function as pt does not recall.  Will also need to assess for caregiver support given recent hosptial admission for accidental drug overdose.  Pt will need supervision at the least at discharge.  CIR if aforementioned available and increased deficits present after medically stability) Patient has agreed to participate in recommended program. Potentially Note that insurance prior authorization may be required for reimbursement for recommended care.  Comment: Rehab Admissions Coordinator to follow up.  Delice Lesch, MD, Mellody Drown Jillian Eaton., PA-C 05/01/2016

## 2016-05-01 NOTE — Progress Notes (Signed)
Inpatient Rehabilitation  I met with the patient at the bedside to discuss the recommendation for IP Rehab if pt. Has adequate social supports and can tolerate the intensity of CIR.  I provided informational booklets and attempted pt's husband to no avail.  We will continue to reach out to family to see if caregiver support is available.  [employer] is very unlikely to approve a CIR stay followed by SNF if she does not make adequate progress.  I will have my partner to follow up with daughter to discuss caregiver availability.  In the meantime, recommend a back up plan if pt. were to be unable to come to CIR.  Please call if questions.  [facility] Admissions Coordinator Cell [phone] Office [phone]

## 2016-05-01 NOTE — Progress Notes (Signed)
Patient Name: Jillian Eaton      Summary Morbidly obese woman having been admitted with respiratory arrest presumed aspiration pneumonia. Positive troponins. thought 2/2 demand ischemia Has systolic HR  EF 55-73%   2/20 EF was normal BB withheld because of concerns of heart block  ( [facility] note 3/8)  Tele reviewed and there is atrial tachwith variable conduction but I dont see heart block in sinus rhythm SUBJECTIVE:   Many fewer palpitations  Tolerating carvedilol     Morbid obesity BMI >60; at home is somewhat ambulatory. No history of syncope  Past Medical History:  Diagnosis Date  . Anemia   . Asthma   . Chronic headache   . DOE (dyspnea on exertion)    2D ECHO, 02/12/2012 - EF 60-65%, moderate concentric hypertrophy  . Fibromyalgia    nerve pain"left side at waist level" "can't lay on that side without pain" , "HOB elevation helps"  . Fibromyalgia   . Heart murmur   . Hematuria - cause not known   . History of kidney stones    x 2 '13, '14 surgery to remove  . Hypertension   . SBO (small bowel obstruction) 06/07/2013  . SOB (shortness of breath)    NUCLEAR STRESS TEST, 03/02/2009 - no ischemic ST changes or symptoms  . Thyroid disease    "goiter"  . Transfusion history    10 yrs+  . Urinary incontinence, functional     Scheduled Meds:  Scheduled Meds: . aspirin EC  81 mg Oral Daily  . carvedilol  3.125 mg Oral BID WC  . furosemide  40 mg Oral Daily  . heparin subcutaneous  5,000 Units Subcutaneous Q8H  . levETIRAcetam  750 mg Oral BID  . levothyroxine  150 mcg Oral QAC breakfast  . mouth rinse  15 mL Mouth Rinse BID  . pantoprazole  40 mg Oral QHS  . potassium chloride  20 mEq Oral BID   Continuous Infusions: sodium chloride, acetaminophen, levalbuterol    PHYSICAL EXAM Vitals:   04/30/16 2241 05/01/16 0619 05/01/16 0647 05/01/16 0924  BP: 104/76 106/67  108/69  Pulse: 78 70    Resp: 18 18    Temp:  98 F (36.7 C)    TempSrc:  Oral      SpO2: 97% 93%    Weight:   (!) 313 lb 0.5 oz (142 kg)   Height:        Well developed  As wearing O2 Morbidly obese  HENT normal Neck supple  Clear Regular rate and rhythm, no murmurs or gallops Abd-soft with active BS No Clubbing cyanosis edema Skin-warm and dry A & Oriented  Grossly normal sensory and motor function    TELEMETRY: Reviewed personnlly pt in  Sinus rhythm with scant episodes of atrial tachycardia      Intake/Output Summary (Last 24 hours) at 05/01/16 0942 Last data filed at 05/01/16 0610  Gross per 24 hour  Intake                0 ml  Output             1100 ml  Net            -1100 ml    LABS: Personally reviewed   Basic Metabolic Panel:  Recent Labs Lab 04/25/16 0250 04/25/16 1816 04/26/16 0422 04/27/16 0345 04/28/16 0945 04/29/16 0536  NA 140  --  142 145 141 143  K 2.9*  --  3.5 3.6 3.6 3.5  CL 105  --  107 103 101 97*  CO2 27  --  30 31 28  32  GLUCOSE 112*  --  94 92 121* 85  BUN 24*  --  25* 19 16 15   CREATININE 1.14*  --  0.89 0.92 0.95 0.96  CALCIUM 7.1*  --  7.2* 7.7* 8.7* 8.3*  MG 1.9 1.8  --   --   --   --   PHOS 3.5 3.6  --   --   --   --    Cardiac Enzymes: No results for input(s): CKTOTAL, CKMB, CKMBINDEX, TROPONINI in the last 72 hours. CBC:  Recent Labs Lab 04/25/16 0250 04/26/16 0422 04/27/16 0345 04/28/16 0945 04/29/16 0536 04/30/16 0736 05/01/16 0434  WBC 10.5 7.6 7.7 8.5 9.3 8.7 10.2  HGB 10.8* 9.6* 11.1* 14.1 13.8 14.6 13.4  HCT 32.9* 30.2* 35.0* 43.1 42.3 44.2 40.7  MCV 97.3 97.7 98.3 98.2 98.8 97.6 97.8  PLT 118* 125* 174 209 203 221 290   PROTIME: No results for input(s): LABPROT, INR in the last 72 hours. Liver Function Tests: No results for input(s): AST, ALT, ALKPHOS, BILITOT, PROT, ALBUMIN in the last 72 hours. No results for input(s): LIPASE, AMYLASE in the last 72 hours. BNP: BNP (last 3 results)  Recent Labs  03/10/16 0346  BNP 47.8    ProBNP (last 3 results) No results for  input(s): PROBNP in the last 8760 hours.  D-Dimer: No results for input(s): DDIMER in the last 72 hours. Hemoglobin A1C: No results for input(s): HGBA1C in the last 72 hours. Fasting Lipid Panel: No results for input(s): CHOL, HDL, LDLCALC, TRIG, CHOLHDL, LDLDIRECT in the last 72 hours. Thyroid Function Tests:  ASSESSMENT AND PLAN:    Atrial tachycardia  Cardiomyopathy, presumed nonischemic  Morbidly obese   Resp failure now extubated   Fewer episodes of atrial tachycardia  Blood pressure is low   Continue e carvedilol 3.125  If BP increases, we can increase carvedilol, but as she ambulates at home a little worried WOULD GET HER OOB  BED REST WILL BE A PROGRESSIVE DEBILITY I hope that her acute cardiomyopathy will resolve in the short-term   Signed, Virl Axe MD  05/01/2016

## 2016-05-01 NOTE — Progress Notes (Addendum)
Physical Therapy Treatment Patient Details Name: Jillian Eaton MRN: [id number] DOB: [birth year] Today's Date: 05/01/2016    History of Present Illness Pt is a 66 y/o morbidly obese female who presents s/p seizure with encephalopathy, and aspiration PNA. Pt was intubated in the field (3/4) and extubated 3/7. PMH significant for HTN, asthma, fibromyalgia, heart murmur, DOE/SOB, urinary incontinence.     PT Comments    Pt tolerated increased activity level today. +2 Mod assist for stand pivot transfer with RW, SaO2 90-92% on RA with activity. Instructed pt in BLE exercises. Short term memory deficits continue.   Follow Up Recommendations  CIR;Supervision/Assistance - 24 hour     Equipment Recommendations  Other (comment) (TBD at next venue)    Recommendations for Other Services Rehab consult     Precautions / Restrictions Precautions Precautions: Fall Precaution Comments: Chronic B knee pain Restrictions Weight Bearing Restrictions: No    Mobility  Bed Mobility Overal bed mobility: Needs Assistance Bed Mobility: Supine to Sit;Sit to Supine     Supine to sit: HOB elevated Sit to supine: Mod assist;+2 for safety/equipment   General bed mobility comments: Pt requires assist for LEs, VCs for technique  Transfers Overall transfer level: Needs assistance Equipment used: Rolling walker (2 wheeled) Transfers: Sit to/from [employer] Transfers Sit to Stand: From elevated surface;Mod assist;+2 safety/equipment;+2 physical assistance Stand pivot transfers: +2 safety/equipment;Min assist       General transfer comment: VCs for hand placement and for technique, mod A to rise, flexed posture intially in standing, able to achieve upright position with verbal/manual cues  Ambulation/Gait             General Gait Details: unable -fatigued after pivot to recliner   Stairs            Wheelchair Mobility    Modified Rankin (Stroke Patients Only)        Balance Overall balance assessment: Needs assistance Sitting-balance support: Feet supported;No upper extremity supported Sitting balance-Leahy Scale: Fair Sitting balance - Comments: Able to sit EOB with supervision    Standing balance support: Bilateral upper extremity supported Standing balance-Leahy Scale: Poor                      Cognition Arousal/Alertness: Awake/alert Behavior During Therapy: WFL for tasks assessed/performed Overall Cognitive Status: Impaired/Different from baseline Area of Impairment: Memory     Memory: Decreased short-term memory       Problem Solving: Requires verbal cues;Requires tactile cues General Comments: decreased short term memory    Exercises General Exercises - Lower Extremity Ankle Circles/Pumps: AROM;Both;10 reps;Supine Quad Sets: AROM;Both;5 reps;Supine Gluteal Sets: AROM;Both;5 reps;Supine    General Comments General comments (skin integrity, edema, etc.): SaO2 90-92% on RA with activity      Pertinent Vitals/Pain Pain Assessment: 0-10 Pain Score: 8  Pain Location: Lower legs  Pain Descriptors / Indicators: Sore Pain Intervention(s): Limited activity within patient's tolerance;Monitored during session;Premedicated before session;Repositioned    Home Living                      Prior Function            PT Goals (current goals can now be found in the care plan section) Acute Rehab PT Goals Patient Stated Goal: to be able to go up stairs PT Goal Formulation: With patient Time For Goal Achievement: 05/12/16 Potential to Achieve Goals: Good Progress towards PT goals: Progressing toward goals    Frequency  Min 3X/week      PT Plan Current plan remains appropriate    Co-evaluation PT/OT/SLP Co-Evaluation/Treatment: Yes           End of Session Equipment Utilized During Treatment: Gait belt Activity Tolerance: Patient limited by fatigue Patient left: in chair;with call bell/phone within  reach Nurse Communication: Mobility status PT Visit Diagnosis: Other abnormalities of gait and mobility (R26.89);Muscle weakness (generalized) (M62.81);Pain Pain - Right/Left: Left Pain - part of body: Leg     Time: 1771-1657 PT Time Calculation (min) (ACUTE ONLY): 26 min  Charges:  $Therapeutic Activity: 8-22 mins                    G Codes:       Philomena Doheny 05/01/2016, 11:51 AM [phone]

## 2016-05-01 NOTE — NC FL2 (Signed)
[place] MEDICAID FL2 LEVEL OF CARE SCREENING TOOL     IDENTIFICATION  Patient Name: Jillian Eaton Birthdate: Sep 06, 1950 Sex: female Admission Date (Current Location): 04/23/2016  [facility] [place] and [place] Number:  Herbalist and Address:  [facility] [place], [place] [address] [place], [place] [address]      Provider Number: [id number]  Attending Physician Name and Address:  Venetia Maxon Rama, MD  Relative Name and Phone Number:       Current Level of Care: SNF Recommended Level of Care: [place] Prior Approval Number:    Date Approved/Denied:   PASRR Number: [id number]  Discharge Plan: SNF    Current Diagnoses: Patient Active Problem List   Diagnosis Date Noted  . Sepsis ([place])   . Chronic pain syndrome   . Fibromyalgia   . Asthma   . Accidental drug overdose   . Hypokalemia   . Acute on chronic combined systolic and diastolic congestive heart failure ([place])   . Dysphagia   . Atrial fibrillation ([place]) 04/28/2016  . Elevated troponin 04/28/2016  . Aspiration pneumonia ([place]) 04/28/2016  . Vocal cord dysfunction 04/28/2016  . Non-STEMI (non-ST elevated myocardial infarction) ([place]) 04/28/2016  . Thrombocytopenia ([place]) 04/28/2016  . Seizures ([place])   . Encounter for central line placement   . Toxic metabolic encephalopathy 51/03/5850  . Pressure injury of skin 03/11/2016  . Altered mental status 03/10/2016  . Contusion of other part of head   . Opioid overdose   . Leg swelling 10/19/2015  . Acute on chronic respiratory failure with hypoxemia ([place]) 08/03/2015  . Bilateral leg edema 05/11/2015  . Essential hypertension 05/11/2015  . UTI (lower urinary tract infection) 06/07/2013  . DOE (dyspnea on exertion) 01/08/2013  . Pulmonary hypertension ([place]) 01/08/2013  . Anemia of chronic disease 03/28/2011  . Asthma 09/22/2010  . Super Morbid Obesity (BMI >60) 09/22/2010  . LOW BACK PAIN, ACUTE 04/11/2008  . TROCHANTERIC  BURSITIS, LEFT 04/11/2008    Orientation RESPIRATION BLADDER Height & Weight     Self, Time, Situation, Place  O2 (3.5L [place]) Incontinent, Indwelling catheter Weight: (!) 313 lb 0.5 oz (142 kg) Height:  5\' 8"  (172.7 cm)  BEHAVIORAL SYMPTOMS/MOOD NEUROLOGICAL BOWEL NUTRITION STATUS      Incontinent Diet (see DC summary)  AMBULATORY STATUS COMMUNICATION OF NEEDS Skin   Extensive Assist Verbally Normal                       Personal Care Assistance Level of Assistance  Bathing, Dressing Bathing Assistance: Maximum assistance   Dressing Assistance: Maximum assistance     Functional Limitations Info             SPECIAL CARE FACTORS FREQUENCY  PT (By licensed PT), OT (By licensed OT)     PT Frequency: 5/wk OT Frequency: 5/wk            Contractures      Additional Factors Info  Code Status, Allergies Code Status Info: FULL Allergies Info: Penicillins           Current Medications (05/01/2016):  This is the current hospital active medication list Current Facility-Administered Medications  Medication Dose Route Frequency Provider Last Rate Last Dose  . 0.9 %  sodium chloride infusion   Intravenous PRN Chesley Mires, MD 10 mL/hr at 04/27/16 1800    . acetaminophen (TYLENOL) tablet 650 mg  650 mg Oral Q6H PRN Chesley Mires, MD   650 mg at  05/01/16 1308  . aspirin EC tablet 81 mg  81 mg Oral Daily Chesley Mires, MD   81 mg at 05/01/16 0930  . carvedilol (COREG) tablet 3.125 mg  3.125 mg Oral BID WC Deboraha Sprang, MD   3.125 mg at 05/01/16 9826  . furosemide (LASIX) tablet 40 mg  40 mg Oral Daily Sherren Mocha, MD   40 mg at 05/01/16 0924  . heparin injection 5,000 Units  5,000 Units Subcutaneous Q8H Chesley Mires, MD   5,000 Units at 05/01/16 0646  . levalbuterol (XOPENEX) nebulizer solution 0.63 mg  0.63 mg Nebulization Q4H PRN Venetia Maxon Rama, MD   0.63 mg at 05/01/16 0912  . levETIRAcetam (KEPPRA) tablet 750 mg  750 mg Oral BID Chesley Mires, MD   750 mg at 05/01/16 0924   . levothyroxine (SYNTHROID, LEVOTHROID) tablet 150 mcg  150 mcg Oral QAC breakfast Chesley Mires, MD   150 mcg at 05/01/16 0924  . MEDLINE mouth rinse  15 mL Mouth Rinse BID Chesley Mires, MD   15 mL at 05/01/16 0931  . pantoprazole (PROTONIX) EC tablet 40 mg  40 mg Oral QHS Chesley Mires, MD   40 mg at 04/30/16 2229  . potassium chloride SA (K-DUR,KLOR-CON) CR tablet 20 mEq  20 mEq Oral BID Venetia Maxon Rama, MD   20 mEq at 05/01/16 4158     Discharge Medications: Please see discharge summary for a list of discharge medications.  Relevant Imaging Results:  Relevant Lab Results:   Additional Information SS#: [id number]  Jorge Ny, LCSW

## 2016-05-01 NOTE — Progress Notes (Signed)
Physical Therapy Treatment Patient Details Name: Jillian Eaton MRN: [id number] DOB: 1950-11-12 Today's Date: 05/01/2016    History of Present Illness Pt is a 66 y/o morbidly obese female who presents s/p seizure with encephalopathy, and aspiration PNA. Pt was intubated in the field (3/4) and extubated 3/7. PMH significant for HTN, asthma, fibromyalgia, heart murmur, DOE/SOB, urinary incontinence.     PT Comments    RN found PT assistant in halls requesting assist to transfer patient back to bed after pt found diaphoretic with irregular rhythms and HR elevated to 170s at rest.  PTA assisted nursing staff with [facility] equipment to assist patient back to bed.  PTA then assisted RN staff to remove sling and position patient.  In supine O2 sats 81% on RA, 2L O2 [place] applied and improved to 91%.  RN at bedside with patient at conclusion of transfer.      Follow Up Recommendations  CIR;Supervision/Assistance - 24 hour     Equipment Recommendations       Recommendations for Other Services Rehab consult     Precautions / Restrictions Precautions Precautions: Fall Precaution Comments: Chronic B knee pain Restrictions Weight Bearing Restrictions: No    Mobility  Bed Mobility Overal bed mobility: Needs Assistance Bed Mobility: Rolling Rolling: Mod assist;+2 for physical assistance (rolling to right and left to remove maximove and bed pad from under patient.  )   Sit to supine:  (total assist with use of maximove sling.  )   General bed mobility comments: Pt lethargic, cold, clamy, diaphoretic.  Assisted patient back to bed and positioned.  Obtained SPO2 in supine 81% on RA, RN applied 2L and O2 increased to 91%.   Transfers Overall transfer level: Needs assistance Equipment used:  (maximove sling/lift equipment) Transfers:  (total assist in maxi move sling after HR elevated to 170s and patient lethargic and diaphoretic.  )         General transfer comment: Assisted with  maneuvering patient back to bed to ensure position and safety.   Ambulation/Gait                 Stairs            Wheelchair Mobility    Modified Rankin (Stroke Patients Only)          Cognition Arousal/Alertness: Lethargic Behavior During Therapy: Flat affect Overall Cognitive Status: Impaired/Different from baseline Area of Impairment: Memory   Current Attention Level: Selective           General Comments: Pt diaphoretic with HR elevated to 170s.  Assisted nursing with maximove transfer back to bed.      Exercises      General Comments        Pertinent Vitals/Pain Pain Assessment: No/denies pain Pain Score: 0-No pain     Home Living                      Prior Function            PT Goals (current goals can now be found in the care plan section) Acute Rehab PT Goals Patient Stated Goal: to get back in the bed Potential to Achieve Goals: Good Progress towards PT goals: Progressing toward goals    Frequency    Min 3X/week      PT Plan Current plan remains appropriate    Co-evaluation   Reason for Co-Treatment: Complexity of the patient's impairments (multi-system involvement);Necessary to address cognition/behavior during functional activity;For patient/therapist  safety;To address functional/ADL transfers   OT goals addressed during session: ADL's and self-care;Proper use of Adaptive equipment and DME;Strengthening/ROM     End of Session Equipment Utilized During Treatment:  (maximove sling.  ) Activity Tolerance: Patient limited by fatigue;Patient limited by lethargy;Treatment limited secondary to medical complications (Comment) (Assisted patient with back to bed transfer due to HR elevated to 170s.  ) Patient left: with call bell/phone within reach;in bed;with nursing/sitter in room (RN at bedside where PTA obstained O2 sats after observing increased respirations.  ) Nurse Communication: Mobility status PT Visit  Diagnosis: Other abnormalities of gait and mobility (R26.89);Muscle weakness (generalized) (M62.81);Pain Pain - Right/Left: Left Pain - part of body: Leg     Time: 1540-1550 PT Time Calculation (min) (ACUTE ONLY): 10 min  Charges:  $Therapeutic Activity: 8-22 mins                    G Codes:       Cristela Blue May 21, 2016, 4:03 PM Governor Rooks, PTA pager [phone]

## 2016-05-01 NOTE — Progress Notes (Signed)
Occupational Therapy Treatment Patient Details Name: Jillian Eaton MRN: [id number] DOB: 01/01/1951 Today's Date: 05/01/2016    History of present illness Pt is a 66 y/o morbidly obese female who presents s/p seizure with encephalopathy, and aspiration PNA. Pt was intubated in the field (3/4) and extubated 3/7. PMH significant for HTN, asthma, fibromyalgia, heart murmur, DOE/SOB, urinary incontinence.    OT comments  Pt able to progress with RW to chair this session with stand pivot transfer total +2 mod (A). Pt able to complete grooming task in chair. Pt reports feeling flush with stable vital signs. Pt upright in chair and reports comfort in chair at this time.   Follow Up Recommendations  CIR;Supervision/Assistance - 24 hour    Equipment Recommendations  3 in 1 bedside commode    Recommendations for Other Services Rehab consult    Precautions / Restrictions Precautions Precautions: Fall Precaution Comments: Chronic B knee pain Restrictions Weight Bearing Restrictions: No       Mobility Bed Mobility Overal bed mobility: Needs Assistance Bed Mobility: Supine to Sit     Supine to sit: +2 for physical assistance;Min assist;HOB elevated Sit to supine: Mod assist;+2 for safety/equipment   General bed mobility comments: pt pulling up with bed rail on L side. Pt needed (A) for bil LE off eob with incr time. Pt reports pain with any tactile input to BIL LE. pt need help to scoot hips to EOB and to power trunk off bed surface  Transfers Overall transfer level: Needs assistance Equipment used: Rolling walker (2 wheeled) Transfers: Sit to/from [employer] Sit to Stand: From elevated surface;Mod assist;+2 safety/equipment;+2 physical assistance Stand pivot transfers: +2 safety/equipment;Min assist       General transfer comment: pt with v/c for safety and to advance bil Le to chair prior to attempting to sit. pt able to shift weight and alternate feed with  transfer    Balance Overall balance assessment: Needs assistance Sitting-balance support: Feet supported;No upper extremity supported Sitting balance-Leahy Scale: Fair Sitting balance - Comments: Able to sit EOB with supervision    Standing balance support: Bilateral upper extremity supported Standing balance-Leahy Scale: Poor                     ADL Overall ADL's : Needs assistance/impaired Eating/Feeding: Modified independent   Grooming: Wash/dry hands;Oral care;Wash/dry face;Modified independent                   Toilet Transfer: +2 for physical assistance;Minimal assistance             General ADL Comments: pt completed EOB to chair transfer with setup for adl once in chair. pt reports "feeling flush" with transfer. pt VS stable and monitored closely      Vision                     Perception     Praxis      Cognition   Behavior During Therapy: WFL for tasks assessed/performed Overall Cognitive Status: Impaired/Different from baseline Area of Impairment: Memory   Current Attention Level: Selective Memory: Decreased short-term memory        Problem Solving: Requires verbal cues;Requires tactile cues General Comments: Pt several times during session responds "you know my memory is not very good right now" Pt did speak very highly of her dog "max " that is 42 yo. pt flat affect with movement and decr speed to responses      Exercises  General Exercises - Lower Extremity Ankle Circles/Pumps: AROM;Both;10 reps;Supine Quad Sets: AROM;Both;5 reps;Supine Gluteal Sets: AROM;Both;5 reps;Supine   Shoulder Instructions       General Comments      Pertinent Vitals/ Pain       Pain Assessment: Faces Pain Score: 8  Faces Pain Scale: Hurts even more Pain Location: Lower legs  Pain Descriptors / Indicators: Sore Pain Intervention(s): Limited activity within patient's tolerance;Monitored during session;Premedicated before  session;Repositioned  Home Living                                          Prior Functioning/Environment              Frequency  Min 2X/week        Progress Toward Goals  OT Goals(current goals can now be found in the care plan section)  Progress towards OT goals: Progressing toward goals  Acute Rehab OT Goals Patient Stated Goal: to be able to go up stairs OT Goal Formulation: With patient/family Time For Goal Achievement: 05/12/16 Potential to Achieve Goals: Good ADL Goals Pt Will Perform Upper Body Bathing: with min assist;sitting Pt Will Perform Lower Body Bathing: with mod assist;sit to/from stand Pt Will Transfer to Toilet: with mod assist;squat pivot transfer;bedside commode Pt Will Perform Toileting - Clothing Manipulation and hygiene: with mod assist;sit to/from stand Pt/caregiver will Perform Home Exercise Program: Increased strength;Right Upper extremity;Left upper extremity;With written HEP provided;With Supervision  Plan Discharge plan remains appropriate    Co-evaluation    PT/OT/SLP Co-Evaluation/Treatment: Yes Reason for Co-Treatment: Complexity of the patient's impairments (multi-system involvement);Necessary to address cognition/behavior during functional activity;For patient/therapist safety;To address functional/ADL transfers   OT goals addressed during session: ADL's and self-care;Proper use of Adaptive equipment and DME;Strengthening/ROM      End of Session Equipment Utilized During Treatment: Oxygen  OT Visit Diagnosis: Muscle weakness (generalized) (M62.81);Cognitive communication deficit (R41.841)   Activity Tolerance Patient limited by fatigue   Patient Left with call bell/phone within reach;with family/visitor present;in chair   Nurse Communication Mobility status;Precautions        Time: 9407 [phone] OT Time Calculation (min): 30 min  Charges: OT General Charges $OT Visit: 1 Procedure OT  Treatments $Therapeutic Activity: 8-22 mins   Jeri Modena   OTR/L Pager: [phone] Office: [phone] .    Parke Poisson B 05/01/2016, 1:58 PM

## 2016-05-01 NOTE — Progress Notes (Signed)
Progress Note    Jillian Eaton  ZOX:[id number] DOB: Jun 06, 1950  DOA: 04/23/2016 PCP: Mauricia Area, MD    Brief Narrative:   Chief complaint: Follow-up aspiration pneumonia and seizures  Medical records reviewed and are as summarized below.  MERIAM SEKELSKY is an 66 y.o. female with a PMH of restrictive lung disease secondary to morbid obesity status post gastroplasty in 1985, asthma, chronic pain syndrome on chronic narcotic therapy (history of unintentional narcotic overdose), fibromyalgia, hypothyroidism, chronic diastolic CHF who was admitted 09/20/17 by the critical care team after she was found down and unresponsive. She was intubated by EMS prior to transport to the hospital. She was given Versed for suspected seizure activity, and was incontinent of bowels and bladder. Her fever was 103 and she had leukocytosis with a left shift on initial presentation. She was started on vancomycin/aztreonam/Levaquin. Initial chest x-ray showed findings concerning for aspiration. She was successfully extubated 04/26/16 and her care was transferred to [facility] on 04/28/16.  Assessment/Plan:   Principal Problem:   Acute on chronic respiratory failure with hypoxemia (HCC)/aspiration pneumonia/focal cord dysfunction/pulmonary hypertension Patient was admitted by the critical care team and required intubation. Extubated 04/26/16. Placed on broad-spectrum antibiotics including Azactam, vancomycin, Levaquin, and cefepime. Completed a 7 day course of abx with Fortaz and vancomycin. Blood, urine cultures negative. Influenza panel negative. Continue bronchodilators. Respiratory status stable.  Active Problems:   Super Morbid Obesity (BMI >60) Will need CIR vs SNF for rehabilitation. Sedentary prior to admission. Body mass index is 48.5 kg/m.    Essential hypertension Cozaar discontinued, now on carvedilol 3.125 twice a day per cardiology.    Hypothyroidism Continue Synthroid. TSH 9.239.    Toxic  metabolic encephalopathy/seizures Evaluated by neurologist. CT of the head negative. EEG done 04/24/16 and showed diffuse slowing with rhythmic sharp waves in the left parietal region. Currently on Keppra. No evidence of ongoing seizure activity. Neurology recommends weaning Keppra after acute illness resolved.    Atrial fibrillation (HCC)/ectopic atrial tachycardia Cardiology consulted and patient placed on a heparin drip. Currently on subcutaneous heparin as she is not a good candidate for long-term anticoagulation. Continue aspirin. Continues to have bursts of tachycardic activity. Evaluated by Dr. Graciela Husbands who has placed her on Coreg. Does not feel she is a good candidate for ablation.    Elevated troponin/non-ST elevation MI/acute systolic CHF 2-D echo showed EF 25-30 percent with mild MR and mild LA dilatation. Continue aspirin and Lasix. Avoid beta blockers in the setting of AV block. Noninvasive imaging not felt to be feasible due to her super morbid obesity. Medical management recommended by cardiology. Recommends repeat echocardiogram in 3 months to assess for recovery of LV function.    Thrombocytopenia (HCC) Resolved.    Diarrhea Resolving, rectal tube now out. C. difficile studies were negative.    Hypokalemia On routine supplementation.    Anemia of critical illness and chronic disease No current indication for transfusion.    Dysphagia Evaluated by speech therapy. Continue dysphagia 2 diet.  HIV screening Screening done 04/23/16: Negative.    Intertriginous dermatitis Likely fungal. We'll give a dose of Diflucan 1 and continue skin care recommendations per wound care nurse with house antimicrobial textile, InterDry Ag+.    Family Communication/Anticipated D/C date and plan/Code Status   DVT prophylaxis: Heparin ordered. Code Status: Full Code.  Family Communication: Husband at the bedside 04/29/16. Disposition Plan: CIR vs SNF   Medical Consultants:     Cardiology  Pulmonology  Neurology  CIR   Procedures:    EEG 04/24/16  2-D echocardiogram 04/24/16  Anti-Infectives:    Vancomycin 3/04 >>3/10  Azactam 3/04 >> 3/05   Levaquin 3/04 >> 3/05   Cefepime 3/05 >> 3/07  Elita Quick 3/07 >>3/10  Diflucan 13/11/8  Subjective:   Continues to report that she feels weak and that she continues to have some shortness of breath. Rectal tube back in but scant loose stools in the collection bag.  Appetite ok.  Objective:    Vitals:   04/30/16 1255 04/30/16 2241 05/01/16 0619 05/01/16 0647  BP: 104/77 104/76 106/67   Pulse: 86 78 70   Resp: 17 18 18    Temp: 97.8 F (36.6 C)  98 F (36.7 C)   TempSrc: Oral  Oral   SpO2: 99% 97% 93%   Weight:    (!) 142 kg (313 lb 0.5 oz)  Height:        Intake/Output Summary (Last 24 hours) at 05/01/16 0816 Last data filed at 05/01/16 0610  Gross per 24 hour  Intake                0 ml  Output             1100 ml  Net            -1100 ml   Filed Weights   04/29/16 0510 04/30/16 0752 05/01/16 0647  Weight: (!) 141.8 kg (312 lb 9.6 oz) (!) 142 kg (313 lb) (!) 142 kg (313 lb 0.5 oz)    Exam: General exam: Appears calm and comfortable. Morbidly obese. Respiratory system: Diminished but clear. Respiratory effort normal. Cardiovascular system: S1 & S2 heard, RRR. No JVD,  rubs, gallops or clicks. No murmurs. Gastrointestinal system: Abdomen is nondistended, soft and nontender. No organomegaly or masses felt. Normal bowel sounds heard. Central nervous system: Alert and oriented x 2. No focal neurological deficits. Generalized weakness. Extremities: No clubbing,  or cyanosis. 1+ edema. Skin: No rashes, lesions or ulcers. Psychiatry: Judgement and insight appear fair. Mood & affect appropriate.   Data Reviewed:   I have personally reviewed following labs and imaging studies:  Labs: Basic Metabolic Panel:  Recent Labs Lab 04/24/16 1826  04/25/16 0250 04/25/16 1816 04/26/16 0422  04/27/16 0345 04/28/16 0945 04/29/16 0536  NA  --   --  140  --  142 145 141 143  K  --   < > 2.9*  --  3.5 3.6 3.6 3.5  CL  --   --  105  --  107 103 101 97*  CO2  --   --  27  --  30 31 28  32  GLUCOSE  --   --  112*  --  94 92 121* 85  BUN  --   --  24*  --  25* 19 16 15   CREATININE  --   --  1.14*  --  0.89 0.92 0.95 0.96  CALCIUM  --   --  7.1*  --  7.2* 7.7* 8.7* 8.3*  MG 2.0  --  1.9 1.8  --   --   --   --   PHOS 3.3  --  3.5 3.6  --   --   --   --   < > = values in this interval not displayed. GFR Estimated Creatinine Clearance: 90.1 mL/min (by C-G formula based on SCr of 0.96 mg/dL). Liver Function Tests: No results for input(s): AST, ALT, ALKPHOS, BILITOT, PROT, ALBUMIN in  the last 168 hours. CBC:  Recent Labs Lab 04/27/16 0345 04/28/16 0945 04/29/16 0536 04/30/16 0736 05/01/16 0434  WBC 7.7 8.5 9.3 8.7 10.2  HGB 11.1* 14.1 13.8 14.6 13.4  HCT 35.0* 43.1 42.3 44.2 40.7  MCV 98.3 98.2 98.8 97.6 97.8  PLT 174 209 203 221 290   Cardiac Enzymes:  Recent Labs Lab 04/24/16 1826 04/25/16 0000 04/25/16 1051  TROPONINI 4.10* 3.17* 2.03*   CBG:  Recent Labs Lab 04/26/16 2322 04/27/16 0310 04/27/16 0827 04/27/16 1150 04/27/16 1621  GLUCAP 99 89 94 78 83   Sepsis Labs:  Recent Labs Lab 04/28/16 0945 04/29/16 0536 04/30/16 0736 05/01/16 0434  WBC 8.5 9.3 8.7 10.2    Microbiology Recent Results (from the past 240 hour(s))  Blood Culture (routine x 2)     Status: None   Collection Time: 04/23/16  7:45 PM  Result Value Ref Range Status   Specimen Description BLOOD LEFT ANTECUBITAL  Final   Special Requests BOTTLES DRAWN AEROBIC AND ANAEROBIC 5CC  Final   Culture NO GROWTH 5 DAYS  Final   Report Status 04/28/2016 FINAL  Final  Blood Culture (routine x 2)     Status: None   Collection Time: 04/23/16  8:01 PM  Result Value Ref Range Status   Specimen Description BLOOD LEFT HAND  Final   Special Requests IN PEDIATRIC BOTTLE  3CC  Final   Culture NO  GROWTH 5 DAYS  Final   Report Status 04/28/2016 FINAL  Final  Urine culture     Status: None   Collection Time: 04/23/16 11:59 PM  Result Value Ref Range Status   Specimen Description URINE, RANDOM  Final   Special Requests NONE  Final   Culture NO GROWTH  Final   Report Status 04/25/2016 FINAL  Final  MRSA PCR Screening     Status: None   Collection Time: 04/24/16 12:02 AM  Result Value Ref Range Status   MRSA by PCR NEGATIVE NEGATIVE Final    Comment:        The GeneXpert MRSA Assay (FDA approved for NASAL specimens only), is one component of a comprehensive MRSA colonization surveillance program. It is not intended to diagnose MRSA infection nor to guide or monitor treatment for MRSA infections.   C difficile quick scan w PCR reflex     Status: None   Collection Time: 04/24/16  8:03 AM  Result Value Ref Range Status   C Diff antigen NEGATIVE NEGATIVE Final   C Diff toxin NEGATIVE NEGATIVE Final   C Diff interpretation No C. difficile detected.  Final    Radiology: No results found.  Medications:   . aspirin EC  81 mg Oral Daily  . carvedilol  3.125 mg Oral BID WC  . furosemide  40 mg Oral Daily  . heparin subcutaneous  5,000 Units Subcutaneous Q8H  . levETIRAcetam  750 mg Oral BID  . levothyroxine  150 mcg Oral QAC breakfast  . mouth rinse  15 mL Mouth Rinse BID  . pantoprazole  40 mg Oral QHS  . pneumococcal 23 valent vaccine  0.5 mL Intramuscular Tomorrow-1000  . potassium chloride  20 mEq Oral BID   Continuous Infusions:  Medical decision making is of high complexity and this patient is at high risk of deterioration, therefore this is a level 3 visit.  (> 4 problem points, 1 data point, Mod risk)    LOS: 8 days   Lucian Baswell  [facility] Pager [phone]. If unable to  reach me by pager, please call my cell phone at [phone].  *Please refer to [email or web address], password [email or web address] to get updated schedule on who will round on this patient, as hospitalists  switch teams weekly. If 7PM-7AM, please contact night-coverage at [email or web address], password [email or web address] for any overnight needs.  05/01/2016, 8:16 AM

## 2016-05-02 ENCOUNTER — Other Ambulatory Visit: Payer: Self-pay | Admitting: Emergency Medicine

## 2016-05-02 DIAGNOSIS — I428 Other cardiomyopathies: Secondary | ICD-10-CM

## 2016-05-02 MED ORDER — ASPIRIN 81 MG PO TBEC: 81.0000 mg | DELAYED_RELEASE_TABLET | Freq: Every day | ORAL | Status: DC

## 2016-05-02 MED ORDER — LEVETIRACETAM 500 MG PO TABS
500.0000 mg | ORAL_TABLET | Freq: Two times a day (BID) | ORAL | Status: DC
  Administered 2016-05-02 – 2016-05-05 (×6): 500 mg via ORAL
  Filled 2016-05-02 (×6): qty 1

## 2016-05-02 MED ORDER — POTASSIUM CHLORIDE CRYS ER 20 MEQ PO TBCR: 20.0000 meq | EXTENDED_RELEASE_TABLET | Freq: Two times a day (BID) | ORAL | Status: DC

## 2016-05-02 MED ORDER — SODIUM CHLORIDE 0.9 % IV BOLUS (SEPSIS): 250.0000 mL | Freq: Once | INTRAVENOUS | Status: DC

## 2016-05-02 MED ORDER — FUROSEMIDE 40 MG PO TABS: 40.0000 mg | ORAL_TABLET | Freq: Every day | ORAL | Status: DC

## 2016-05-02 MED ORDER — LEVALBUTEROL HCL 0.63 MG/3ML IN NEBU: 0.6300 mg | INHALATION_SOLUTION | RESPIRATORY_TRACT | 12 refills | Status: DC | PRN

## 2016-05-02 MED ORDER — CARVEDILOL 6.25 MG PO TABS: 6.2500 mg | ORAL_TABLET | Freq: Two times a day (BID) | ORAL | Status: DC

## 2016-05-02 MED ORDER — WHITE PETROLATUM GEL
Status: AC
  Administered 2016-05-02: 06:00:00
  Filled 2016-05-02: qty 1

## 2016-05-02 MED ORDER — LEVETIRACETAM 500 MG PO TABS: 500.0000 mg | ORAL_TABLET | Freq: Two times a day (BID) | ORAL | Status: DC

## 2016-05-02 NOTE — Progress Notes (Signed)
RN paged earlier because pt's BP was 60/30 manually. Pt was alert, oriented and was voiding. No dizziness, SOB or chest pain.  Ordered 250cc bolus, but before given, RN rechecked SBP at 99. D/c'd bolus.  RN to recheck in one hour. Follow BP.  KJKG, NP [facility]

## 2016-05-02 NOTE — Progress Notes (Signed)
CSW provided bed offers to patient and spouse- they have chosen [facility] for rehab.  [facility] has begun Ship broker for [employer]- approval pending  CSW will continue to follow- plan is for [place], [place] Social Worker [phone]

## 2016-05-02 NOTE — Progress Notes (Signed)
DAILY PROGRESS NOTE  Subjective:  No further atrial tachycardia overnight. BP limits increase in carvedilol. 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  Objective:  Temp:  [97.8 F (36.6 C)-98.1 F (36.7 C)] 97.8 F (36.6 C) (03/13 0434) Pulse Rate:  [85-86] 86 (03/12 2030) Resp:  [16-18] 18 (03/13 0434) BP: (94-113)/(55-82) 100/57 (03/13 1018) SpO2:  [92 %-95 %] 94 % (03/13 0434) Weight change:   Intake/Output from previous day: 03/12 0701 - 03/13 0700 In: 680 [P.O.:680] Out: 850 [Urine:850]  Intake/Output from this shift: No intake/output data recorded.  Medications: No current facility-administered medications on file prior to encounter.    Current Outpatient Prescriptions on File Prior to Encounter  Medication Sig Dispense Refill  . amLODipine (NORVASC) 5 MG tablet Take 5 mg by mouth daily.  1  . celecoxib (CELEBREX) 200 MG capsule Take 200 mg by mouth every morning.     . cyclobenzaprine (FLEXERIL) 10 MG tablet Take 1 tablet (10 mg total) by mouth 3 (three) times daily as needed for muscle spasms. 30 tablet 0  . DULoxetine (CYMBALTA) 60 MG capsule Take 60 mg by mouth every evening.     . Eszopiclone (ESZOPICLONE) 3 MG TABS Take 1 tablet (3 mg total) by mouth at bedtime as needed. Take immediately before bedtime (Patient taking differently: Take 3 mg by mouth at bedtime. Take immediately before bedtime )  0  . furosemide (LASIX) 20 MG tablet Take 1 tablet (20 mg total) by mouth daily. 30 tablet 0  . levothyroxine (SYNTHROID, LEVOTHROID) 150 MCG tablet Take 2 tablets (300 mcg total) by mouth daily before breakfast. 60 tablet 0  . losartan (COZAAR) 100 MG tablet Take 100 mg by mouth every morning.     . mirabegron ER (MYRBETRIQ) 50 MG TB24 Take 50 mg by mouth every morning.     . pantoprazole (PROTONIX) 40 MG tablet Take 1 tablet (40 mg total) by mouth at bedtime. 30 tablet 0  . pregabalin (LYRICA) 200 MG capsule Take 200 mg by mouth 3 (three) times daily.      . Vitamin  D, Ergocalciferol, (DRISDOL) 50000 units CAPS capsule Take 50,000 Units by mouth every 7 (seven) days.    Marland Kitchen acetaminophen (TYLENOL) 650 MG CR tablet Take 650 mg by mouth every 6 (six) hours.    Marland Kitchen albuterol (PROAIR HFA) 108 (90 BASE) MCG/ACT inhaler Inhale 2 puffs into the lungs every 6 (six) hours as needed for wheezing or shortness of breath.     . budesonide-formoterol (SYMBICORT) 160-4.5 MCG/ACT inhaler Inhale 2 puffs into the lungs 2 (two) times daily as needed (shortness of breath.).     Marland Kitchen calcium carbonate (CALCIUM 600) 600 MG TABS tablet Take 600 mg by mouth 2 (two) times daily with a meal.    . oxyCODONE (OXY IR/ROXICODONE) 5 MG immediate release tablet Take 1 tablet (5 mg total) by mouth every 6 (six) hours as needed for moderate pain. (Patient not taking: Reported on 04/24/2016) 20 tablet 0    Physical Exam: General appearance: alert, no distress and morbidly obese Lungs: clear to auscultation bilaterally Heart: regular rate and rhythm Extremities: extremities normal, atraumatic, no cyanosis or edema Neurologic: Grossly normal  Lab Results: Results for orders placed or performed during the hospital encounter of 04/23/16 (from the past 48 hour(s))  CBC     Status: None   Collection Time: 05/01/16  4:34 AM  Result Value Ref Range   WBC 10.2 4.0 - 10.5 K/uL   RBC 4.16 3.87 - 5.11  MIL/uL   Hemoglobin 13.4 12.0 - 15.0 g/dL   HCT 40.7 36.0 - 46.0 %   MCV 97.8 78.0 - 100.0 fL   MCH 32.2 26.0 - 34.0 pg   MCHC 32.9 30.0 - 36.0 g/dL   RDW 13.8 11.5 - 15.5 %   Platelets 290 150 - 400 K/uL    Imaging: No results found.  Assessment:  1. Principal Problem: 2.   Acute on chronic respiratory failure with hypoxemia (HCC) 3. Active Problems: 4.   Super Morbid Obesity (BMI >60) 5.   Pulmonary hypertension ([place]) 6.   Essential hypertension 7.   Toxic metabolic encephalopathy 8.   Encounter for central line placement 9.   Seizures ([place]) 10.   Atrial fibrillation ([place]) 11.   Elevated  troponin 12.   Aspiration pneumonia ([place]) 13.   Vocal cord dysfunction 14.   Non-STEMI (non-ST elevated myocardial infarction) ([place]) 15.   Thrombocytopenia ([place]) 16.   Sepsis ([place]) 17.   Chronic pain syndrome 18.   Fibromyalgia 19.   Asthma 20.   Accidental drug overdose 21.   Hypokalemia 22.   Acute on chronic combined systolic and diastolic congestive heart failure ([place]) 23.   Dysphagia 24.   Plan:  1. Atrial tachycardia is improved - no room to up titrate carvedilol at this time. Agree with echo in 3 months to evaluate for LV recovery. Continue ASA and lasix 40 mg daily. I'm happy to see her after her inpatient rehab in 1 month in the office. Can evaluate medication regimen again at that time.  Cardiology will sign-off. Call with questions.  Time Spent Directly with Patient:  15 minutes  Length of Stay:  LOS: 9 days   Pixie Casino, MD, [facility] Attending Cardiologist  E. Lopez 05/02/2016, 10:25 AM

## 2016-05-02 NOTE — Progress Notes (Signed)
  Speech Language Pathology Treatment: Dysphagia  Patient Details Name: Jillian Eaton MRN: [id number] DOB: 05-Jun-1950 Today's Date: 05/02/2016 Time: [id number] SLP Time Calculation (min) (ACUTE ONLY): 8 min  Assessment / Plan / Recommendation Clinical Impression  Pt seen for brief swallow treatment. Discussed various options for potential diet texture upgrade and she stated she wanted to remain on Dys 2 for now. No s/s aspiration with straw sip thin and no complaints of swallowing past few days. Will check on once more this week to determine if pt wants to attempt diet upgrade.    HPI HPI: 66 yo female with acute encephalopathy, seizure (likely provoked by infection/sepsis +/-hypomagnesemia), fever, VDRF. Intubated for airway protection 3/4-3/7. CXR post extubation noted possible aspiration PNA.  She was recently admitted in January 2018 for an unintentional narcotic overdose and hypoxemic/hypercapneic respiratory failure. She was discharged 1/21. PMH of asthma, vocal cord dysfunction.       SLP Plan  Continue with current plan of care       Recommendations  Diet recommendations: Dysphagia 2 (fine chop);Thin liquid Liquids provided via: Cup;Straw Medication Administration: Whole meds with liquid Supervision: Patient able to self feed;Intermittent supervision to cue for compensatory strategies Compensations: Slow rate;Small sips/bites;Lingual sweep for clearance of pocketing Postural Changes and/or Swallow Maneuvers: Seated upright 90 degrees                Oral Care Recommendations: Oral care BID Follow up Recommendations: None SLP Visit Diagnosis: Dysphagia, oral phase (R13.11) Plan: Continue with current plan of care       GO                Houston Siren 05/02/2016, 4:09 PM   Orbie Pyo Colvin Caroli.Ed [employer] [phone]

## 2016-05-02 NOTE — Progress Notes (Signed)
Progress Note    Jillian Eaton  HUD:[id number] DOB: [birth year]  DOA: 04/23/2016 PCP: Red Christians, MD    Brief Narrative:   Chief complaint: Follow-up aspiration pneumonia and seizures  Medical records reviewed and are as summarized below.  Jillian Eaton is an 66 y.o. 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 She was intubated by EMS prior to transport to the hospital. She was given Versed for suspected seizure activity, and was incontinent of bowels and bladder. Her fever was 103 and she had leukocytosis with a left shift on initial presentation. She was started on vancomycin/aztreonam/Levaquin. Initial chest x-ray showed findings concerning for aspiration. She was successfully extubated 04/26/16 and her care was transferred to [facility] on 04/28/16.  Assessment/Plan:   Principal Problem:   Acute on chronic respiratory failure with hypoxemia (HCC)/aspiration pneumonia/focal cord dysfunction/pulmonary hypertension Patient was admitted by the critical care team and required intubation. Extubated 04/26/16. Placed on broad-spectrum antibiotics including Azactam, vancomycin, Levaquin, and cefepime. Completed a 7 day course of abx with Fortaz and vancomycin. Blood, urine cultures negative. Influenza panel negative. Continue bronchodilators. Respiratory status stable.  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 Sedentary prior to admission. Body mass index is 48.5 kg/m.    Essential hypertension Cozaar discontinued, now on carvedilol 3.125 twice a day per cardiology.    Hypothyroidism Continue Synthroid. TSH  9.239. Will need to repeat TSH/free T4 in 4 weeks.    Toxic metabolic encephalopathy/seizures Evaluated by neurologist. CT of the head negative. EEG done 04/24/16 and showed diffuse slowing with rhythmic sharp waves in the left parietal region. Currently on Keppra. No evidence of ongoing seizure activity. Neurology recommends weaning Keppra after acute illness resolved.    Atrial fibrillation (HCC)/ectopic atrial tachycardia Cardiology consulted and patient placed on a heparin drip. Currently on subcutaneous heparin as she is not a good candidate for long-term anticoagulation. Continue aspirin. Continues to have bursts of tachycardic activity. Evaluated by Dr. Caryl Comes who has placed her on Coreg. Does not feel she is a good candidate for ablation.    Elevated troponin/non-ST elevation MI/acute systolic CHF 2-D echo showed EF 25-30 percent with mild MR and mild LA dilatation. Continue aspirin and Lasix.  Noninvasive imaging not felt to be feasible due to her super morbid obesity. Medical management recommended by cardiology. Recommends repeat echocardiogram in 3 months to assess for recovery of LV function.    Thrombocytopenia ([place]) Resolved.    Diarrhea Discontinue rectal tube. C. difficile studies were negative.    Hypokalemia On routine supplementation.    Anemia of critical illness and chronic disease No current indication for transfusion.    Dysphagia Evaluated by speech therapy. Continue dysphagia 2 diet.  HIV screening Screening done 04/23/16: Negative.    Intertriginous dermatitis Likely fungal. Given a dose of Diflucan 1, continue skin care recommendations per wound care nurse with house antimicrobial textile, InterDry Ag+.    Family Communication/Anticipated D/C date and plan/Code Status   DVT prophylaxis: Heparin ordered. Code Status: Full Code.  Family Communication: Husband at the bedside. Disposition Plan: CIR vs SNF   Medical Consultants:     Cardiology  Pulmonology  Neurology  CIR  Procedures:    EEG 04/24/16  2-D echocardiogram 04/24/16  Anti-Infectives:    Vancomycin 3/04 >>3/10  Azactam 3/04 >> 3/05   Levaquin 3/04 >> 3/05   Cefepime 3/05 >> 3/07  Tressie Ellis 3/07 >>3/10  Diflucan 13/11/8  Subjective:   Continues to report that she feels weak and that she continues to have some shortness of breath. No other complaints offered.  Objective:    Vitals:   05/01/16 2100 05/02/16 0434 05/02/16 1018 05/02/16 1032  BP: 102/80 (!) 94/55 (!) 100/57   Pulse:    76  Resp: 16 18    Temp: 98.1 F (36.7 C) 97.8 F (36.6 C)    TempSrc: Oral Oral    SpO2: 95% 94%    Weight:      Height:        Intake/Output Summary (Last 24 hours) at 05/02/16 1613 Last data filed at 05/02/16 0600  Gross per 24 hour  Intake              120 ml  Output              850 ml  Net             -730 ml   Filed Weights   04/29/16 0510 04/30/16 0752 05/01/16 0647  Weight: (!) 141.8 kg (312 lb 9.6 oz) (!) 142 kg (313 lb) (!) 142 kg (313 lb 0.5 oz)    Exam: General exam: Appears calm and comfortable. Morbidly obese. Respiratory system: Diminished but clear. Respiratory effort normal. Cardiovascular system: S1 & S2 heard, RRR. No JVD,  rubs, gallops or clicks. No murmurs. Gastrointestinal system: Abdomen is nondistended, soft and nontender. No organomegaly or masses felt. Normal bowel sounds heard. Central nervous system: Alert and oriented x 2. No focal neurological deficits. Generalized weakness. Extremities: No clubbing,  or cyanosis. 1+ edema. Skin: No rashes, lesions or ulcers. Psychiatry: Judgement and insight appear fair. Mood & affect appropriate.   Data Reviewed:   I have personally reviewed following labs and imaging studies:  Labs: Basic Metabolic Panel:  Recent Labs Lab 04/25/16 1816  04/26/16 0422 04/27/16 0345 04/28/16 0945 04/29/16 0536  NA  --   --  142 145 141 143  K  --   < > 3.5 3.6 3.6 3.5   CL  --   --  107 103 101 97*  CO2  --   --  30 31 28  32  GLUCOSE  --   --  94 92 121* 85  BUN  --   --  25* 19 16 15   CREATININE  --   --  0.89 0.92 0.95 0.96  CALCIUM  --   --  7.2* 7.7* 8.7* 8.3*  MG 1.8  --   --   --   --   --   PHOS 3.6  --   --   --   --   --   < > = values in this interval not displayed. GFR Estimated Creatinine Clearance: 90.1 mL/min (by C-G formula based on SCr of 0.96 mg/dL). Liver Function Tests: No results for input(s): AST, ALT, ALKPHOS, BILITOT, PROT, ALBUMIN in the last 168 hours. CBC:  Recent Labs Lab 04/27/16 0345 04/28/16 0945 04/29/16 0536 04/30/16 0736 05/01/16 0434  WBC 7.7 8.5 9.3 8.7 10.2  HGB 11.1* 14.1 13.8 14.6 13.4  HCT 35.0* 43.1 42.3 44.2 40.7  MCV 98.3 98.2 98.8 97.6 97.8  PLT 174 209 203 221 290   Cardiac Enzymes:  No results for input(s): CKTOTAL, CKMB, CKMBINDEX, TROPONINI in the last 168 hours. CBG:  Recent Labs Lab 04/26/16 2322 04/27/16 0310 04/27/16 0827 04/27/16 1150 04/27/16 1621  GLUCAP 99 89 94 78 83   Sepsis Labs:  Recent Labs Lab 04/28/16 0945 04/29/16 0536 04/30/16 0736 05/01/16 0434  WBC 8.5 9.3 8.7 10.2    Microbiology Recent Results (from the past 240 hour(s))  Blood Culture (routine x 2)     Status: None   Collection Time: 04/23/16  7:45 PM  Result Value Ref Range Status   Specimen Description BLOOD LEFT ANTECUBITAL  Final   Special Requests BOTTLES DRAWN AEROBIC AND ANAEROBIC 5CC  Final   Culture NO GROWTH 5 DAYS  Final   Report Status 04/28/2016 FINAL  Final  Blood Culture (routine x 2)     Status: None   Collection Time: 04/23/16  8:01 PM  Result Value Ref Range Status   Specimen Description BLOOD LEFT HAND  Final   Special Requests IN PEDIATRIC BOTTLE  3CC  Final   Culture NO GROWTH 5 DAYS  Final   Report Status 04/28/2016 FINAL  Final  Urine culture     Status: None   Collection Time: 04/23/16 11:59 PM  Result Value Ref Range Status   Specimen Description URINE, RANDOM  Final    Special Requests NONE  Final   Culture NO GROWTH  Final   Report Status 04/25/2016 FINAL  Final  MRSA PCR Screening     Status: None   Collection Time: 04/24/16 12:02 AM  Result Value Ref Range Status   MRSA by PCR NEGATIVE NEGATIVE Final    Comment:        The GeneXpert MRSA Assay (FDA approved for NASAL specimens only), is one component of a comprehensive MRSA colonization surveillance program. It is not intended to diagnose MRSA infection nor to guide or monitor treatment for MRSA infections.   C difficile quick scan w PCR reflex     Status: None   Collection Time: 04/24/16  8:03 AM  Result Value Ref Range Status   C Diff antigen NEGATIVE NEGATIVE Final   C Diff toxin NEGATIVE NEGATIVE Final   C Diff interpretation No C. difficile detected.  Final    Radiology: No results found.  Medications:   . aspirin EC  81 mg Oral Daily  . carvedilol  6.25 mg Oral BID WC  . furosemide  40 mg Oral Daily  . heparin subcutaneous  5,000 Units Subcutaneous Q8H  . levETIRAcetam  750 mg Oral BID  . levothyroxine  150 mcg Oral QAC breakfast  . mouth rinse  15 mL Mouth Rinse BID  . pantoprazole  40 mg Oral QHS  . potassium chloride  20 mEq Oral BID   Continuous Infusions:  Medical decision making is of high complexity and this patient is at high risk of deterioration, therefore this is a level 3 visit.  (> 4 problem points, 0 data points, Mod risk)    LOS: 9 days   RAMA,CHRISTINA  [facility] Pager [phone]. If unable to reach me by pager, please call my cell phone at [phone].  *Please refer to [email or web address], password [email or web address] to get updated schedule on who will round on this patient, as hospitalists switch teams weekly. If 7PM-7AM, please contact night-coverage at [email or web address], password [email or web address] for any overnight needs.  05/02/2016, 4:13 PM

## 2016-05-02 NOTE — Progress Notes (Signed)
[facility]  Patient Status: Active (receiving services up to time of hospitalization)  [facility] is providing the following services: PT  If patient discharges after hours, please call [phone].   Janae Sauce 05/02/2016, 2:23 PM

## 2016-05-02 NOTE — Discharge Summary (Signed)
Physician Discharge Summary  Mallerie Blok Gatchell ZOX:[id number] DOB: 1950/06/30 DOA: 04/23/2016  PCP: Red Christians, MD  Admit date: 04/23/2016 Discharge date: 05/02/2016  Admitted From: Home Discharge disposition: SNF   Recommendations for Outpatient Follow-Up:   1. Keppra weaned to 500 mg twice a day, and should be weaned further and discontinued over the course of the next week. 2. Recommend follow-up TSH/free T4 in 4 weeks.   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  Diet recommendation: Low sodium, heart healthy.  Carbohydrate-modified.  Regular.  Wound care: None.   History of Present Illness:   Jillian Eaton is an 66 y.o. 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 She was intubated by EMS prior to transport to the hospital. She was given Versed for suspected seizure activity, and was incontinent of bowels and bladder. Her fever was 103 and she had  leukocytosis with a left shift on initial presentation. She was started on vancomycin/aztreonam/Levaquin. Initial chest x-ray showed findings concerning for aspiration. She was successfully extubated 04/26/16 and her care was transferred to [facility] on 04/28/16.  Hospital Course by Problem:   Principal Problem:   Acute on chronic respiratory failure with hypoxemia (HCC)/aspiration pneumonia/focal cord dysfunction/pulmonary hypertension Patient was admitted by the critical care team and required intubation. Extubated 04/26/16. Placed on broad-spectrum antibiotics including Azactam, vancomycin, Levaquin, and cefepime. Completed a 7 day course of abx with Fortaz and vancomycin. Blood, urine cultures negative. Influenza panel negative. Continue bronchodilators. Respiratory status stable.  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 Sedentary prior to admission. Body mass index is 48.5 kg/m.    Essential hypertension Cozaar discontinued, now on carvedilol 3.125 twice a day per cardiology.    Hypothyroidism Continue Synthroid. TSH 9.239. Will need to repeat TSH/free T4 in 4 weeks.    Toxic metabolic encephalopathy/seizures Evaluated by neurologist. CT of the head negative. EEG done 04/24/16 and showed diffuse slowing with rhythmic sharp waves in the left parietal region. Currently on Keppra. No evidence of ongoing seizure activity. Neurology recommends weaning Keppra after acute illness resolved.    Atrial fibrillation (HCC)/ectopic atrial tachycardia Cardiology consulted and patient placed on a heparin drip. Currently on subcutaneous heparin as she is not a good candidate for long-term anticoagulation. Continue aspirin. Continues to have bursts of tachycardic activity. Evaluated by Dr. Caryl Comes who has placed her on Coreg. Does not feel she  is a good candidate for ablation.    Elevated troponin/non-ST elevation MI/acute systolic CHF 2-D  echo showed EF 25-30 percent with mild MR and mild LA dilatation. Continue aspirin and Lasix.  Noninvasive imaging not felt to be feasible due to her super morbid obesity. Medical management recommended by cardiology. Recommends repeat echocardiogram in 3 months to assess for recovery of LV function.    Thrombocytopenia ([place]) Resolved.    Diarrhea Discontinue rectal tube. C. difficile studies were negative.    Hypokalemia On routine supplementation.    Anemia of critical illness and chronic disease No current indication for transfusion.    Dysphagia Evaluated by speech therapy. Continue dysphagia 2 diet.  HIV screening Screening done 04/23/16: Negative.    Intertriginous dermatitis Likely fungal. Given a dose of Diflucan 1, continue skin care recommendations per wound care nurse with house antimicrobial textile, InterDry Ag+.    Medical Consultants:    Cardiology  Pulmonology  Neurology  CIR   Discharge Exam:   Vitals:   05/02/16 1018 05/02/16 1032  BP: (!) 100/57   Pulse:  76  Resp:    Temp:     Vitals:   05/01/16 2100 05/02/16 0434 05/02/16 1018 05/02/16 1032  BP: 102/80 (!) 94/55 (!) 100/57   Pulse:    76  Resp: 16 18    Temp: 98.1 F (36.7 C) 97.8 F (36.6 C)    TempSrc: Oral Oral    SpO2: 95% 94%    Weight:      Height:        General exam: Appears calm and comfortable. Morbidly obese. Respiratory system: Diminished but clear. Respiratory effort normal. Cardiovascular system: S1 & S2 heard, RRR. No JVD,  rubs, gallops or clicks. No murmurs. Gastrointestinal system: Abdomen is nondistended, soft and nontender. No organomegaly or masses felt. Normal bowel sounds heard. Central nervous system: Alert and oriented x 2. No focal neurological deficits. Generalized weakness. Extremities: No clubbing,  or cyanosis. 1+ edema. Skin: No rashes, lesions or ulcers. Psychiatry: Judgement and insight appear fair. Mood & affect appropriate.    The  results of significant diagnostics from this hospitalization (including imaging, microbiology, ancillary and laboratory) are listed below for reference.     Procedures and Diagnostic Studies:   Ct Head Wo Contrast  Result Date: 04/23/2016 CLINICAL DATA:  Confusion and history of hypertension. EXAM: CT HEAD WITHOUT CONTRAST TECHNIQUE: Contiguous axial images were obtained from the base of the skull through the vertex without intravenous contrast. COMPARISON:  06/10/2015 FINDINGS: Brain: No mass lesion, hemorrhage, hydrocephalus, acute infarct, intra-axial, or extra-axial fluid collection. Vascular: No hyperdense vessel or unexpected calcification. Skull: Normal Sinuses/Orbits: Endotracheal and nasogastric tubes are incompletely imaged. Fluid in the oropharynx likely secondary. Normal orbits and globes. Minimal fluid in the sphenoid sinus. Clear mastoid air cells. Other: None IMPRESSION: 1.  No acute intracranial abnormality. 2. Sinus disease. Electronically Signed   By: Abigail Miyamoto M.D.   On: 04/23/2016 22:51   Dg Chest Port 1 View  Result Date: 04/24/2016 CLINICAL DATA:  Central line placement EXAM: PORTABLE CHEST 1 VIEW COMPARISON:  04/23/2016 FINDINGS: Endotracheal tube with the tip 6.3 cm above the carina. Nasogastric tube coursing below the diaphragm. Interval placement of a left jugular central venous catheter with the tip projecting over the SVC. Bilateral interstitial and alveolar airspace opacities. More focal right upper lobe airspace disease. No pneumothorax. No pleural effusion. Stable cardiomegaly. No acute osseous abnormality. IMPRESSION: 1. Interval placement of a left jugular central venous catheter  with the tip projecting over the SVC. 2. Bilateral patchy interstitial and alveolar airspace opacities more focal in the right upper lobe. Differential considerations include pulmonary edema versus right upper lobe pneumonia. Electronically Signed   By: Kathreen Devoid   On: 04/24/2016 18:30   Dg  Chest Port 1 View  Result Date: 04/23/2016 CLINICAL DATA:  Seizure with unresponsiveness EXAM: PORTABLE CHEST 1 VIEW COMPARISON:  08/03/2015 CXR FINDINGS: AP portable semi upright view of the chest was provided. The patient is slightly rotated. There is stable cardiomegaly with uncoiling of the thoracic aorta. Endotracheal tube tip is satisfactory at 5.1 cm above the carina. A gastric tube is seen extending towards the diaphragm. The tip is excluded. Patchy airspace opacities are seen in the right upper lobe suspicious for aspiration. Minimal bibasilar opacities may reflect areas of atelectasis and/or infiltrate as well. Osteoarthritis of the visualized glenohumeral joints. Skin fold artifact projects over the right lateral humeral head. IMPRESSION: 1. Cardiomegaly with bilateral airspace opacities predominantly in the right upper lobe with lesser degree of atelectasis and/or minimal infiltrate in both lung bases. Findings may reflect aspiration pneumonia. 2. Satisfactory endotracheal tube tip position approximately 5.1 cm above the carina. A gastric tube is incompletely visualized but appears to extend below the diaphragm. Electronically Signed   By: Ashley Royalty M.D.   On: 04/23/2016 20:07   Dg Abd Portable 1v  Result Date: 04/24/2016 CLINICAL DATA:  Evaluate orogastric tube placement EXAM: PORTABLE ABDOMEN - 1 VIEW COMPARISON:  June 08, 2013 FINDINGS: The OG tube terminates with the side-port just below the GE junction and the distal tip just within the stomach. IMPRESSION: The side port and distal tip of the OG tube are just below the GE junction in the upper stomach. Electronically Signed   By: Dorise Bullion III M.D   On: 04/24/2016 12:03     Labs:   Basic Metabolic Panel:  Recent Labs Lab 04/25/16 1816  04/26/16 0422 04/27/16 0345 04/28/16 0945 04/29/16 0536  NA  --   --  142 145 141 143  K  --   < > 3.5 3.6 3.6 3.5  CL  --   --  107 103 101 97*  CO2  --   --  30 31 28  32  GLUCOSE  --    --  94 92 121* 85  BUN  --   --  25* 19 16 15   CREATININE  --   --  0.89 0.92 0.95 0.96  CALCIUM  --   --  7.2* 7.7* 8.7* 8.3*  MG 1.8  --   --   --   --   --   PHOS 3.6  --   --   --   --   --   < > = values in this interval not displayed. GFR Estimated Creatinine Clearance: 90.1 mL/min (by C-G formula based on SCr of 0.96 mg/dL). Liver Function Tests: No results for input(s): AST, ALT, ALKPHOS, BILITOT, PROT, ALBUMIN in the last 168 hours. No results for input(s): LIPASE, AMYLASE in the last 168 hours. No results for input(s): AMMONIA in the last 168 hours. Coagulation profile No results for input(s): INR, PROTIME in the last 168 hours.  CBC:  Recent Labs Lab 04/27/16 0345 04/28/16 0945 04/29/16 0536 04/30/16 0736 05/01/16 0434  WBC 7.7 8.5 9.3 8.7 10.2  HGB 11.1* 14.1 13.8 14.6 13.4  HCT 35.0* 43.1 42.3 44.2 40.7  MCV 98.3 98.2 98.8 97.6 97.8  PLT 174 209  203 221 290   Cardiac Enzymes: No results for input(s): CKTOTAL, CKMB, CKMBINDEX, TROPONINI in the last 168 hours. BNP: Invalid input(s): POCBNP CBG:  Recent Labs Lab 04/26/16 2322 04/27/16 0310 04/27/16 0827 04/27/16 1150 04/27/16 1621  GLUCAP 99 89 94 78 83   D-Dimer No results for input(s): DDIMER in the last 72 hours. Hgb A1c No results for input(s): HGBA1C in the last 72 hours. Lipid Profile No results for input(s): CHOL, HDL, LDLCALC, TRIG, CHOLHDL, LDLDIRECT in the last 72 hours. Thyroid function studies No results for input(s): TSH, T4TOTAL, T3FREE, THYROIDAB in the last 72 hours.  Invalid input(s): FREET3 Anemia work up No results for input(s): VITAMINB12, FOLATE, FERRITIN, TIBC, IRON, RETICCTPCT in the last 72 hours. Microbiology Recent Results (from the past 240 hour(s))  Blood Culture (routine x 2)     Status: None   Collection Time: 04/23/16  7:45 PM  Result Value Ref Range Status   Specimen Description BLOOD LEFT ANTECUBITAL  Final   Special Requests BOTTLES DRAWN AEROBIC AND  ANAEROBIC 5CC  Final   Culture NO GROWTH 5 DAYS  Final   Report Status 04/28/2016 FINAL  Final  Blood Culture (routine x 2)     Status: None   Collection Time: 04/23/16  8:01 PM  Result Value Ref Range Status   Specimen Description BLOOD LEFT HAND  Final   Special Requests IN PEDIATRIC BOTTLE  3CC  Final   Culture NO GROWTH 5 DAYS  Final   Report Status 04/28/2016 FINAL  Final  Urine culture     Status: None   Collection Time: 04/23/16 11:59 PM  Result Value Ref Range Status   Specimen Description URINE, RANDOM  Final   Special Requests NONE  Final   Culture NO GROWTH  Final   Report Status 04/25/2016 FINAL  Final  MRSA PCR Screening     Status: None   Collection Time: 04/24/16 12:02 AM  Result Value Ref Range Status   MRSA by PCR NEGATIVE NEGATIVE Final    Comment:        The GeneXpert MRSA Assay (FDA approved for NASAL specimens only), is one component of a comprehensive MRSA colonization surveillance program. It is not intended to diagnose MRSA infection nor to guide or monitor treatment for MRSA infections.   C difficile quick scan w PCR reflex     Status: None   Collection Time: 04/24/16  8:03 AM  Result Value Ref Range Status   C Diff antigen NEGATIVE NEGATIVE Final   C Diff toxin NEGATIVE NEGATIVE Final   C Diff interpretation No C. difficile detected.  Final     Discharge Instructions:   Discharge Instructions    (HEART FAILURE PATIENTS) Call MD:  Anytime you have any of the following symptoms: 1) 3 pound weight gain in 24 hours or 5 pounds in 1 week 2) shortness of breath, with or without a dry hacking cough 3) swelling in the hands, feet or stomach 4) if you have to sleep on extra pillows at night in order to breathe.    Complete by:  As directed    Call MD for:  extreme fatigue    Complete by:  As directed    Call MD for:  persistant dizziness or light-headedness    Complete by:  As directed    Diet - low sodium heart healthy    Complete by:  As directed     Increase activity slowly    Complete by:  As directed  Allergies as of 05/02/2016      Reactions   Penicillins Other (See Comments)   intolerance      Medication List    STOP taking these medications   amLODipine 5 MG tablet Commonly known as:  NORVASC   celecoxib 200 MG capsule Commonly known as:  CELEBREX   cyclobenzaprine 10 MG tablet Commonly known as:  FLEXERIL   DULoxetine 60 MG capsule Commonly known as:  CYMBALTA   Eszopiclone 3 MG Tabs Commonly known as:  eszopiclone   losartan 100 MG tablet Commonly known as:  COZAAR   MYRBETRIQ 50 MG Tb24 tablet Generic drug:  mirabegron ER   oxyCODONE 5 MG immediate release tablet Commonly known as:  Oxy IR/ROXICODONE   Oxycodone HCl 10 MG Tabs   pregabalin 200 MG capsule Commonly known as:  LYRICA     TAKE these medications   acetaminophen 650 MG CR tablet Commonly known as:  TYLENOL Take 650 mg by mouth every 6 (six) hours.   aspirin 81 MG EC tablet Take 1 tablet (81 mg total) by mouth daily. Start taking on:  05/03/2016   budesonide-formoterol 160-4.5 MCG/ACT inhaler Commonly known as:  SYMBICORT Inhale 2 puffs into the lungs 2 (two) times daily as needed (shortness of breath.).   CALCIUM 600 600 MG Tabs tablet Generic drug:  calcium carbonate Take 600 mg by mouth 2 (two) times daily with a meal.   carvedilol 6.25 MG tablet Commonly known as:  COREG Take 1 tablet (6.25 mg total) by mouth 2 (two) times daily with a meal.   furosemide 40 MG tablet Commonly known as:  LASIX Take 1 tablet (40 mg total) by mouth daily. Start taking on:  05/03/2016 What changed:  medication strength  how much to take   levalbuterol 0.63 MG/3ML nebulizer solution Commonly known as:  XOPENEX Take 3 mLs (0.63 mg total) by nebulization every 4 (four) hours as needed for wheezing or shortness of breath.   levETIRAcetam 500 MG tablet Commonly known as:  KEPPRA Take 1 tablet (500 mg total) by mouth 2 (two) times  daily.   levothyroxine 150 MCG tablet Commonly known as:  SYNTHROID, LEVOTHROID Take 2 tablets (300 mcg total) by mouth daily before breakfast.   pantoprazole 40 MG tablet Commonly known as:  PROTONIX Take 1 tablet (40 mg total) by mouth at bedtime.   potassium chloride SA 20 MEQ tablet Commonly known as:  K-DUR,KLOR-CON Take 1 tablet (20 mEq total) by mouth 2 (two) times daily.   PROAIR HFA 108 (90 Base) MCG/ACT inhaler Generic drug:  albuterol Inhale 2 puffs into the lungs every 6 (six) hours as needed for wheezing or shortness of breath.   Vitamin D (Ergocalciferol) 50000 units Caps capsule Commonly known as:  DRISDOL Take 50,000 Units by mouth every 7 (seven) days.      Follow-up Information    Pixie Casino, MD. Go on 05/31/2016.   Specialty:  Cardiology Why:  Appointment time is 9:20am, please arrive 15 minutes early. Contact information: [place] [address] [phone]            Time coordinating discharge: 35 minutes.  Signed:  RAMA,CHRISTINA  Pager [phone] [facility] 05/02/2016, 4:24 PM

## 2016-05-02 NOTE — Progress Notes (Addendum)
I met with pt and her husband at bedside with SW, [place]. An inpt rehab bed is not available for this pt today to pursue [employer] approval . Recommended other rehab venues to be pursued at this time. RN CM is aware. It is doubtful that insurance would approve an inpt rehab stay at this level with her therapy. Fatigues easily and required maximove to assist back to bed yesterday from chair. She needs a prolonged rehab recovery at this time. [phone]

## 2016-05-03 LAB — BASIC METABOLIC PANEL
Anion gap: 16 — ABNORMAL HIGH (ref 5–15)
BUN: 29 mg/dL — ABNORMAL HIGH (ref 6–20)
CO2: 22 mmol/L (ref 22–32)
Calcium: 9 mg/dL (ref 8.9–10.3)
Chloride: 102 mmol/L (ref 101–111)
Creatinine, Ser: 1.34 mg/dL — ABNORMAL HIGH (ref 0.44–1.00)
GFR calc Af Amer: 48 mL/min — ABNORMAL LOW (ref >60)
GFR calc non Af Amer: 41 mL/min — ABNORMAL LOW (ref >60)
GLUCOSE: 129 mg/dL — AB (ref 65–99)
POTASSIUM: 4.6 mmol/L (ref 3.5–5.1)
Sodium: 140 mmol/L (ref 135–145)

## 2016-05-03 LAB — MAGNESIUM: Magnesium: 1.6 mg/dL — ABNORMAL LOW (ref 1.7–2.4)

## 2016-05-03 NOTE — Progress Notes (Addendum)
Brief Note:   The discharge order, summary and planning was done yesterday. Patient is waiting for preauthorization for SNF discharge. Discussed with the social worker today, likely okay to transfer patient's care to SNF today.  Patient was seen and examined at bedside. Overnight events noted when patient was found to have hypotension. Apparently patient was asymptomatic, unknown if hypotension according was due to technical error with the BP cuff. Patient reported feeling good and ready to go to SNF for continuing current care. She denied headache, dizziness or nausea, vomiting, chest pain or shortness of breath. Repeat BP in the morning improved.   Apparently, patient had few beats of NSVT today. Patient is clinically asymptomatic. Evaluated by cardiologist for atrial tachycardia and echocardiogram was done during hospitalization. Patient has LVEF of 25 %. Patient is already on Coreg. I will check BMP and magnesium level.  I think patient is medically stable to transfer her care to SNF with close outpatient follow-up. She needs close monitoring of blood pressure and heart rate. I discussed the discharge planning with the social worker and care team this morning.

## 2016-05-03 NOTE — Progress Notes (Signed)
Pt had a 7 beat run of Vtach. Pt asymptomatic. MD notified.

## 2016-05-03 NOTE — Progress Notes (Signed)
CSW received call that pt no longer wanting [facility] rehab and instead wants [facility] explained to patient that [facility] had already started authorization and that we could not guarentee another facility could get auth and pt was already [facility] for today  Pt is insistent on changing facilities- CSW explained again that pt is discharged and no medical reason for remaining in the hospital that insurance might not cover her hospital stay  CSW will continue to follow- Ronney Lion is calling insurance to take over the [place], [place] Worker [phone]

## 2016-05-03 NOTE — Progress Notes (Signed)
Physical Therapy Treatment Patient Details Name: Jillian Eaton MRN: [id number] DOB: [birth year] Today's Date: 05/03/2016    History of Present Illness Pt is a 66 y/o morbidly obese female who presents s/p seizure with encephalopathy, and aspiration PNA. Pt was intubated in the field (3/4) and extubated 3/7. PMH significant for HTN, asthma, fibromyalgia, heart murmur, DOE/SOB, urinary incontinence.     PT Comments    Pt performed increased activity.  Reports feeling hot in chair and PTA obtained BP 75/49 in LUE.  HR elevated to 150 bpm, O2 sats 97%.  Plan remains appropriate for rehab in a post acute setting will continue efforts per POC.      Follow Up Recommendations  CIR;Supervision/Assistance - 24 hour     Equipment Recommendations  Other (comment) (TBD at next venue.  )    Recommendations for Other Services Rehab consult     Precautions / Restrictions Precautions Precautions: Fall Precaution Comments: Chronic B knee pain Restrictions Weight Bearing Restrictions: No    Mobility  Bed Mobility Overal bed mobility: Needs Assistance         Sit to supine: Mod assist;+2 for physical assistance   General bed mobility comments: Pt required assist for advancing LEs to edge of bed and to elevate trunk into sitting.  Pt required cues and assist with chux  to scoot edge of bed.    Transfers Overall transfer level: Needs assistance Equipment used: Rolling walker (2 wheeled) (bariatric) Transfers: Risk manager;Sit to/from Stand Sit to Stand: Mod assist;+2 safety/equipment Stand pivot transfers: Min assist;+2 physical assistance;+2 safety/equipment       General transfer comment: Pt required assistance to boost into standing.  Pt then required cues for upright posture, remains flexed in standing.    Ambulation/Gait Ambulation/Gait assistance: Min assist;+2 physical assistance Ambulation Distance (Feet): 5 Feet (steps from bed to chair.  ) Assistive device:  Rolling walker (2 wheeled) (bariatric.  ) Gait Pattern/deviations: Step-through pattern;Trunk flexed;Decreased stride length   Gait velocity interpretation: Below normal speed for age/gender General Gait Details: Pt performed short shuffling steps from bed to chair.  Pt remains flexed over walker during steps to chair.  Assist to move RW.  Cues for sequencing, turning and backing.     Stairs            Wheelchair Mobility    Modified Rankin (Stroke Patients Only)       Balance Overall balance assessment: Needs assistance Sitting-balance support: Feet supported;No upper extremity supported Sitting balance-Leahy Scale: Fair       Standing balance-Leahy Scale: Poor                      Cognition Arousal/Alertness: Awake/alert Behavior During Therapy: Flat affect Overall Cognitive Status: Impaired/Different from baseline Area of Impairment: Memory     Memory: Decreased short-term memory   Safety/Judgement: Decreased awareness of safety;Decreased awareness of deficits Awareness: Emergent;Intellectual Problem Solving: Requires verbal cues;Requires tactile cues      Exercises      General Comments        Pertinent Vitals/Pain Pain Assessment: No/denies pain Faces Pain Scale: Hurts even more Pain Location: Lower legs  Pain Descriptors / Indicators: Sore Pain Intervention(s): Monitored during session;Repositioned    Home Living                      Prior Function            PT Goals (current goals can now be found  in the care plan section) Acute Rehab PT Goals Patient Stated Goal: to get in the chair Potential to Achieve Goals: Good Progress towards PT goals: Progressing toward goals    Frequency    Min 3X/week      PT Plan Current plan remains appropriate    Co-evaluation             End of Session Equipment Utilized During Treatment: Gait belt Activity Tolerance: Patient limited by fatigue;Patient limited by  lethargy;Treatment limited secondary to medical complications (Comment) Patient left: with call bell/phone within reach;in bed;with nursing/sitter in room Nurse Communication: Mobility status PT Visit Diagnosis: Other abnormalities of gait and mobility (R26.89);Muscle weakness (generalized) (M62.81);Pain Pain - Right/Left: Left Pain - part of body: Leg     Time: 5947-0761 PT Time Calculation (min) (ACUTE ONLY): 15 min  Charges:  $Therapeutic Activity: 8-22 mins                    G Codes:       Cristela Blue 2016/05/31, 12:03 PM Governor Rooks, PTA pager [phone]

## 2016-05-04 LAB — BASIC METABOLIC PANEL
ANION GAP: 13 (ref 5–15)
BUN: 31 mg/dL — AB (ref 6–20)
CHLORIDE: 103 mmol/L (ref 101–111)
CO2: 25 mmol/L (ref 22–32)
Calcium: 8.5 mg/dL — ABNORMAL LOW (ref 8.9–10.3)
Creatinine, Ser: 1.18 mg/dL — ABNORMAL HIGH (ref 0.44–1.00)
GFR calc Af Amer: 56 mL/min — ABNORMAL LOW (ref >60)
GFR, EST NON AFRICAN AMERICAN: 48 mL/min — AB (ref >60)
Glucose, Bld: 92 mg/dL (ref 65–99)
POTASSIUM: 4.9 mmol/L (ref 3.5–5.1)
SODIUM: 141 mmol/L (ref 135–145)

## 2016-05-04 MED ORDER — MAGNESIUM SULFATE 2 GM/50ML IV SOLN
2.0000 g | Freq: Once | INTRAVENOUS | Status: AC
  Administered 2016-05-04: 2 g via INTRAVENOUS
  Filled 2016-05-04 (×2): qty 50

## 2016-05-04 NOTE — Progress Notes (Signed)
CSW continuing to follow for transfer to SNF  [employer] representative Bonnita Nasuti called CSW to request PT/OT eval be sent in- requested clinicals sent  CSW then received a call from [facility] stating that patient needed a "Consent for Case Management" form be signed and sent in- Bonnita Nasuti states she spoke with case management office this morning and faxed it to them- CSW found form with help from case management office and had pt sign it and sent it in   [facility] states that per Federal guidelines patient would be denied SNF stay if she were to DC from hospital prior to getting insurance approval- no facility willing to accept patient on LOG due to this stipulation in [employer].  Jorge Ny, LCSW Clinical Social Worker [phone]

## 2016-05-04 NOTE — Progress Notes (Addendum)
PROGRESS NOTE    Jillian Eaton  NGE:[id number] DOB: 1950-10-11 DOA: 04/23/2016 PCP: Mauricia Area, MD   Brief Narrative: Jillian Eaton an 66 y.o.femalewith a PMH of restrictive lung disease secondary to morbid obesity status post gastroplasty in 1985, asthma, chronic pain syndrome on chronic narcotic therapy (history of unintentional narcotic overdose), fibromyalgia, hypothyroidism, chronic diastolic CHF who was admitted 4/0/10 by the critical care team after she was found down and unresponsive. She was intubated by EMS prior to transport to the hospital. She was given Versed for suspected seizure activity, and was incontinent of bowels and bladder. Her fever was 103 and she had leukocytosis with a left shift on initial presentation. She was started on vancomycin/aztreonam/Levaquin. Initial chest x-ray showed findings concerning for aspiration. She was successfully extubated 04/26/16 and her care was transferred to [facility] on 04/28/16.  Patient is medically stable and awaiting insurance authorization for safe discharge to SNF. Serum creatinine level is stable today. Patient's blood pressure stable. She has no new complaints. I will convert continue current medications and provide current management.  Principal Problem:   Acute on chronic respiratory failure with hypoxemia (HCC) Active Problems:   Super Morbid Obesity (BMI >60)   Pulmonary hypertension (HCC)   Essential hypertension   Toxic metabolic encephalopathy   Encounter for central line placement   Seizures (HCC)   Atrial fibrillation (HCC)   Elevated troponin   Aspiration pneumonia (HCC)   Vocal cord dysfunction   Non-STEMI (non-ST elevated myocardial infarction) (HCC)   Thrombocytopenia (HCC)   Sepsis (HCC)   Chronic pain syndrome   Fibromyalgia   Asthma   Accidental drug overdose   Hypokalemia   Acute on chronic combined systolic and diastolic congestive heart failure (HCC)   Dysphagia  DVT prophylaxis: Heparin  subcutaneous Code Status: Full code Family Communication: No family present at bedside Disposition Plan: Likely discharge to SNF today or tomorrow when insurance authorization obtained. Discussed with the Child psychotherapist.  Consultants:   None  Procedures: None Antimicrobials: None  Subjective: Patient was seen and examined at bedside. Denied fever, chills, nausea, vomiting, chest pain, shortness of breath. No abdominal pain.  Objective: Vitals:   05/03/16 1353 05/03/16 1635 05/03/16 2137 05/04/16 0534  BP: 96/73 99/78 109/71 122/77  Pulse: 85 79 76 68  Resp: 16  18 18   Temp: 97.5 F (36.4 C)  97.6 F (36.4 C) 98 F (36.7 C)  TempSrc:   Oral Oral  SpO2: 100%  91% 99%  Weight:    (!) 179.9 kg (396 lb 8 oz)  Height:        Intake/Output Summary (Last 24 hours) at 05/04/16 1445 Last data filed at 05/03/16 1917  Gross per 24 hour  Intake                0 ml  Output              450 ml  Net             -450 ml   Filed Weights   05/01/16 0647 05/03/16 0602 05/04/16 0534  Weight: (!) 142 kg (313 lb 0.5 oz) (!) 147.4 kg (325 lb) (!) 179.9 kg (396 lb 8 oz)    Examination:  General exam: Appears calm and comfortable  Respiratory system: Clear to auscultation. Respiratory effort normal.  Cardiovascular system: S1 & S2 heard, RRR.  No pedal edema. Gastrointestinal system: Abdomen is nondistended, soft and nontender. Normal bowel sounds heard. Central nervous system: Alert  and oriented. No focal neurological deficits. Skin: No rashes, lesions or ulcers Psychiatry: Judgement and insight appear normal. Mood & affect appropriate.     Data Reviewed: I have personally reviewed following labs and imaging studies  CBC:  Recent Labs Lab 04/28/16 0945 04/29/16 0536 04/30/16 0736 05/01/16 0434  WBC 8.5 9.3 8.7 10.2  HGB 14.1 13.8 14.6 13.4  HCT 43.1 42.3 44.2 40.7  MCV 98.2 98.8 97.6 97.8  PLT 209 203 221 290   Basic Metabolic Panel:  Recent Labs Lab 04/28/16 0945  04/29/16 0536 05/03/16 1154 05/04/16 0749  NA 141 143 140 141  K 3.6 3.5 4.6 4.9  CL 101 97* 102 103  CO2 28 32 22 25  GLUCOSE 121* 85 129* 92  BUN 16 15 29* 31*  CREATININE 0.95 0.96 1.34* 1.18*  CALCIUM 8.7* 8.3* 9.0 8.5*  MG  --   --  1.6*  --    GFR: Estimated Creatinine Clearance: 85 mL/min (A) (by C-G formula based on SCr of 1.18 mg/dL (H)). Liver Function Tests: No results for input(s): AST, ALT, ALKPHOS, BILITOT, PROT, ALBUMIN in the last 168 hours. No results for input(s): LIPASE, AMYLASE in the last 168 hours. No results for input(s): AMMONIA in the last 168 hours. Coagulation Profile: No results for input(s): INR, PROTIME in the last 168 hours. Cardiac Enzymes: No results for input(s): CKTOTAL, CKMB, CKMBINDEX, TROPONINI in the last 168 hours. BNP (last 3 results) No results for input(s): PROBNP in the last 8760 hours. HbA1C: No results for input(s): HGBA1C in the last 72 hours. CBG:  Recent Labs Lab 04/27/16 1621  GLUCAP 83   Lipid Profile: No results for input(s): CHOL, HDL, LDLCALC, TRIG, CHOLHDL, LDLDIRECT in the last 72 hours. Thyroid Function Tests: No results for input(s): TSH, T4TOTAL, FREET4, T3FREE, THYROIDAB in the last 72 hours. Anemia Panel: No results for input(s): VITAMINB12, FOLATE, FERRITIN, TIBC, IRON, RETICCTPCT in the last 72 hours. Sepsis Labs: No results for input(s): PROCALCITON, LATICACIDVEN in the last 168 hours.  No results found for this or any previous visit (from the past 240 hour(s)).       Radiology Studies: No results found.      Scheduled Meds: . aspirin EC  81 mg Oral Daily  . carvedilol  6.25 mg Oral BID WC  . furosemide  40 mg Oral Daily  . heparin subcutaneous  5,000 Units Subcutaneous Q8H  . levETIRAcetam  500 mg Oral BID  . levothyroxine  150 mcg Oral QAC breakfast  . mouth rinse  15 mL Mouth Rinse BID  . pantoprazole  40 mg Oral QHS  . potassium chloride  20 mEq Oral BID   Continuous Infusions:    LOS: 11 days    Jillian Getman [name], MD [facility] Pager [phone]  If 7PM-7AM, please contact night-coverage [email or web address] Password [email or web address] 05/04/2016, 2:45 PM

## 2016-05-04 NOTE — Progress Notes (Signed)
Patient will need short term skilled nursing stay for approximately 30 days.  At skilled nursing patient will need to work with PT/OT to improve mobility with hope of returning to baseline functioning (walking with a rolling walker and being able to do most ADLs independently or with assistive devices).  Patient will also need continued wound care at skilled nursing for moisture associated skin damage requiring the areas be cleaned twice daily per [facility] instructions.  Jorge Ny, LCSW Clinical Social Worker [phone]

## 2016-05-05 NOTE — Clinical Social Work Placement (Signed)
   CLINICAL SOCIAL WORK PLACEMENT  NOTE  Date:  05/05/2016  Patient Details  Name: Jillian Eaton MRN: [id number] Date of Birth: 12/11/1950  Clinical Social Work is seeking post-discharge placement for this patient at the [place] level of care (*CSW will initial, date and re-position this form in  chart as items are completed):  Yes   Patient/family provided with [place] Work Department's list of facilities offering this level of care within the geographic area requested by the patient (or if unable, by the patient's family).  Yes   Patient/family informed of their freedom to choose among providers that offer the needed level of care, that participate in Medicare, Medicaid or managed care program needed by the patient, have an available bed and are willing to accept the patient.  Yes   Patient/family informed of [facility]'s ownership interest in [facility] and [facility], as well as of the fact that they are under no obligation to receive care at these facilities.  PASRR submitted to EDS on 05/01/16     PASRR number received on 05/01/16     Existing PASRR number confirmed on       FL2 transmitted to all facilities in geographic area requested by pt/family on 05/01/16     FL2 transmitted to all facilities within larger geographic area on       Patient informed that his/her managed care company has contracts with or will negotiate with certain facilities, including the following:        Yes   Patient/family informed of bed offers received.  Patient chooses bed at [facility]     Physician recommends and patient chooses bed at      Patient to be transferred to [facility] on 05/05/16.  Patient to be transferred to facility by [facility]     Patient family notified on 05/05/16 of transfer.  Name of family member notified:  Patient alerted spouse     PHYSICIAN       Additional Comment:     _______________________________________________ Benard Halsted, [place] 05/05/2016, 2:54 PM

## 2016-05-05 NOTE — Progress Notes (Signed)
Patient will DC to: [facility] Anticipated DC date: 05/05/16 Family notified: Patient alerted family Transport by: [facility] 4:30pm   Per MD patient ready for DC to [facility]. RN, patient, patient's family, and facility notified of DC. Discharge Summary sent to facility. RN given number for report. DC packet on chart. Ambulance transport requested for patient.   CSW signing off.  Cedric Fishman, [place] Social Worker [phone]

## 2016-05-05 NOTE — Progress Notes (Signed)
PROGRESS NOTE    Jillian Eaton  ZOX:[id number] DOB: Apr 10, 1950 DOA: 04/23/2016 PCP: Mauricia Area, MD   Brief Narrative: Jillian Eaton an 66 y.o.femalewith a PMH of restrictive lung disease secondary to morbid obesity status post gastroplasty in 1985, asthma, chronic pain syndrome on chronic narcotic therapy (history of unintentional narcotic overdose), fibromyalgia, hypothyroidism, chronic diastolic CHF who was admitted 09/20/17 by the critical care team after she was found down and unresponsive. She was intubated by EMS prior to transport to the hospital. She was given Versed for suspected seizure activity, and was incontinent of bowels and bladder. Her fever was 103 and she had leukocytosis with a left shift on initial presentation. She was started on vancomycin/aztreonam/Levaquin. Initial chest x-ray showed findings concerning for aspiration. She was successfully extubated 04/26/16 and her care was transferred to [facility] on 04/28/16.  Patient is medically stable and awaiting insurance authorization for safe discharge to SNF. Patient remains clinically stable with no new complaints oral medications. She will be discharged to SNF when bed is available. Meantime I will continue current medical and supportive care. She has been tolerating Coreg and Lasix well. Continue PT OT treatment. Upon discharge I recommended patient to follow-up with PCP and cardiologist.  Principal Problem:   Acute on chronic respiratory failure with hypoxemia (HCC) Active Problems:   Super Morbid Obesity (BMI >60)   Pulmonary hypertension (HCC)   Essential hypertension   Toxic metabolic encephalopathy   Encounter for central line placement   Seizures (HCC)   Atrial fibrillation (HCC)   Elevated troponin   Aspiration pneumonia (HCC)   Vocal cord dysfunction   Non-STEMI (non-ST elevated myocardial infarction) (HCC)   Thrombocytopenia (HCC)   Sepsis (HCC)   Chronic pain syndrome   Fibromyalgia   Asthma  Accidental drug overdose   Hypokalemia   Acute on chronic combined systolic and diastolic congestive heart failure (HCC)   Dysphagia  DVT prophylaxis: Heparin subcutaneous Code Status: Full code Family Communication: No family present at bedside Disposition Plan: Likely discharge to SNF today or tomorrow when insurance authorization obtained. Discussed with the Child psychotherapist.  Consultants:   None  Procedures: None Antimicrobials: None  Subjective: Patient was seen and examined at bedside. No new event. Denied headache, dizziness, nausea, vomiting, chest pain or shortness of breath. Objective: Vitals:   05/04/16 1531 05/04/16 2214 05/05/16 0426 05/05/16 0431  BP: 112/75 93/69  101/73  Pulse: 81 73  72  Resp: 20   18  Temp: 98.1 F (36.7 C) 98 F (36.7 C)  98.4 F (36.9 C)  TempSrc: Oral Oral  Oral  SpO2: 100% 97%  92%  Weight:   (!) 176.4 kg (389 lb)   Height:        Intake/Output Summary (Last 24 hours) at 05/05/16 1034 Last data filed at 05/05/16 0426  Gross per 24 hour  Intake              108 ml  Output             1525 ml  Net            -1417 ml   Filed Weights   05/03/16 0602 05/04/16 0534 05/05/16 0426  Weight: (!) 147.4 kg (325 lb) (!) 179.9 kg (396 lb 8 oz) (!) 176.4 kg (389 lb)    Examination:  General exam: Not in distress, lying on bed comfortable  Respiratory system: Clear to auscultation bilateral, respiratory effort normal.  Cardiovascular system: Regular rate rhythm, S1  and S2 normal. No pedal edema. Gastrointestinal system: Abdomen is nondistended, soft and nontender. Normal bowel sounds heard. Central nervous system: Alert and oriented. No focal neurological deficits. Skin: No rashes, lesions or ulcers Psychiatry: Judgement and insight appear normal. Mood & affect appropriate.     Data Reviewed: I have personally reviewed following labs and imaging studies  CBC:  Recent Labs Lab 04/28/16 0945 04/29/16 0536 04/30/16 0736  05/01/16 0434  WBC 8.5 9.3 8.7 10.2  HGB 14.1 13.8 14.6 13.4  HCT 43.1 42.3 44.2 40.7  MCV 98.2 98.8 97.6 97.8  PLT 209 203 221 290   Basic Metabolic Panel:  Recent Labs Lab 04/28/16 0945 04/29/16 0536 05/03/16 1154 05/04/16 0749  NA 141 143 140 141  K 3.6 3.5 4.6 4.9  CL 101 97* 102 103  CO2 28 32 22 25  GLUCOSE 121* 85 129* 92  BUN 16 15 29* 31*  CREATININE 0.95 0.96 1.34* 1.18*  CALCIUM 8.7* 8.3* 9.0 8.5*  MG  --   --  1.6*  --    GFR: Estimated Creatinine Clearance: 83.9 mL/min (A) (by C-G formula based on SCr of 1.18 mg/dL (H)). Liver Function Tests: No results for input(s): AST, ALT, ALKPHOS, BILITOT, PROT, ALBUMIN in the last 168 hours. No results for input(s): LIPASE, AMYLASE in the last 168 hours. No results for input(s): AMMONIA in the last 168 hours. Coagulation Profile: No results for input(s): INR, PROTIME in the last 168 hours. Cardiac Enzymes: No results for input(s): CKTOTAL, CKMB, CKMBINDEX, TROPONINI in the last 168 hours. BNP (last 3 results) No results for input(s): PROBNP in the last 8760 hours. HbA1C: No results for input(s): HGBA1C in the last 72 hours. CBG: No results for input(s): GLUCAP in the last 168 hours. Lipid Profile: No results for input(s): CHOL, HDL, LDLCALC, TRIG, CHOLHDL, LDLDIRECT in the last 72 hours. Thyroid Function Tests: No results for input(s): TSH, T4TOTAL, FREET4, T3FREE, THYROIDAB in the last 72 hours. Anemia Panel: No results for input(s): VITAMINB12, FOLATE, FERRITIN, TIBC, IRON, RETICCTPCT in the last 72 hours. Sepsis Labs: No results for input(s): PROCALCITON, LATICACIDVEN in the last 168 hours.  No results found for this or any previous visit (from the past 240 hour(s)).       Radiology Studies: No results found.      Scheduled Meds: . aspirin EC  81 mg Oral Daily  . carvedilol  6.25 mg Oral BID WC  . furosemide  40 mg Oral Daily  . heparin subcutaneous  5,000 Units Subcutaneous Q8H  .  levETIRAcetam  500 mg Oral BID  . levothyroxine  150 mcg Oral QAC breakfast  . mouth rinse  15 mL Mouth Rinse BID  . pantoprazole  40 mg Oral QHS  . potassium chloride  20 mEq Oral BID   Continuous Infusions:   LOS: 12 days    Jillian Dickard [name], MD [facility] Pager [phone]  If 7PM-7AM, please contact night-coverage [email or web address] Password [facility] 05/05/2016, 10:34 AM

## 2016-05-05 NOTE — Progress Notes (Signed)
Physical Therapy Treatment Patient Details Name: Jillian Eaton MRN: [id number] DOB: 11/15/1950 Today's Date: 05/05/2016    History of Present Illness Pt is a 66 y/o morbidly obese female who presents s/p seizure with encephalopathy, and aspiration PNA. Pt was intubated in the field (3/4) and extubated 3/7. PMH significant for HTN, asthma, fibromyalgia, heart murmur, DOE/SOB, urinary incontinence.     PT Comments    Pt performed increased activity during session and able to advance gait in room to improve activity tolerance and functional mobility.  Pt remains to c/o chronic pain in B knees.  Pt required cues for upper trunk control and RW safety.  Pt reports feeling hot during session and cool wash cloth applied behind neck post tx.  Will continue PT per POC.  Pt remains appropriate for post acute rehab at this time.     Follow Up Recommendations  CIR;Supervision/Assistance - 24 hour     Equipment Recommendations  Other (comment)    Recommendations for Other Services Rehab consult     Precautions / Restrictions Precautions Precautions: Fall Precaution Comments: Chronic B knee pain Restrictions Weight Bearing Restrictions: No    Mobility  Bed Mobility Overal bed mobility: Needs Assistance Bed Mobility: Rolling;Supine to Sit Rolling: Mod assist (Mod assist to roll to the Right to remove the bed pan.  Pt on bed pan on arrival.  )   Supine to sit: Min assist;HOB elevated     General bed mobility comments: Pt required assist with LEs during rolling but able to use rail to roll upper trunk.   Pt then required assist to move to edge of bed with + use of rail and HOB slightly elevated.  Pt then able to scoot in sitting with supervision.    Transfers Overall transfer level: Needs assistance Equipment used: Rolling walker (2 wheeled) (bariatric) Transfers: Risk manager;Sit to/from Stand Sit to Stand: Mod assist         General transfer comment: Pt required  assistance to boost into standing.  Pt then required cues for upright posture, remains flexed in standing briefly then able to correct.    Ambulation/Gait Ambulation/Gait assistance: Min assist Ambulation Distance (Feet): 24 Feet Assistive device: Rolling walker (2 wheeled) Gait Pattern/deviations: Step-through pattern;Wide base of support;Shuffle;Decreased stride length   Gait velocity interpretation: Below normal speed for age/gender General Gait Details: Pt required cues for RW safety, assist with turning and obstacle negotiation.  Pt fatigues quickly but remains safe during sequencing.     Stairs            Wheelchair Mobility    Modified Rankin (Stroke Patients Only)       Balance   Sitting-balance support: Feet supported;No upper extremity supported Sitting balance-Leahy Scale: Good       Standing balance-Leahy Scale: Poor                      Cognition Arousal/Alertness: Awake/alert Behavior During Therapy: WFL for tasks assessed/performed Overall Cognitive Status: Within Functional Limits for tasks assessed       Memory: Decreased short-term memory   Safety/Judgement: Decreased awareness of deficits          Exercises General Exercises - Upper Extremity Shoulder Flexion:  (10 reps for 3 sets) Shoulder ABduction: AROM (10 reps times 3 sets) Elbow Flexion: AROM (10 reps times 3 sets) Elbow Extension: AROM (10 reps times 3 sets.)    General Comments        Pertinent Vitals/Pain Pain Assessment:  0-10 Pain Score: 8  Faces Pain Scale: Hurts a little bit Pain Location: knees Pain Descriptors / Indicators: Sore Pain Intervention(s): Monitored during session    Home Living                      Prior Function            PT Goals (current goals can now be found in the care plan section) Acute Rehab PT Goals Patient Stated Goal: to get better Potential to Achieve Goals: Good Progress towards PT goals: Progressing toward  goals    Frequency    Min 3X/week      PT Plan Current plan remains appropriate    Co-evaluation             End of Session Equipment Utilized During Treatment: Gait belt Activity Tolerance: Patient limited by fatigue;Patient limited by lethargy;Treatment limited secondary to medical complications (Comment) Patient left: in chair;with call bell/phone within reach Nurse Communication: Mobility status PT Visit Diagnosis: Other abnormalities of gait and mobility (R26.89);Muscle weakness (generalized) (M62.81);Pain Pain - Right/Left: Left Pain - part of body: Leg     Time: 1136-1200 PT Time Calculation (min) (ACUTE ONLY): 24 min  Charges:  $Gait Training: 8-22 mins $Therapeutic Activity: 8-22 mins                    G Codes:       Cristela Blue May 06, 2016, 12:13 PM Governor Rooks, PTA pager [phone]

## 2016-05-05 NOTE — Progress Notes (Signed)
Nutrition Follow-up  DOCUMENTATION CODES:   Morbid obesity  INTERVENTION:   -Continue dysphagia 2 diet  NUTRITION DIAGNOSIS:   Inadequate oral intake related to inability to eat as evidenced by NPO status.  Resolved, no new nutrition dx at this time  GOAL:   Patient will meet greater than or equal to 90% of their needs  Met with PO intake  MONITOR:   PO intake, Diet advancement, Labs, Weight trends, Skin, I & O's  REASON FOR ASSESSMENT:   Consult Enteral/tube feeding initiation and management  ASSESSMENT:   66 yo female brought to ER with seizure and altered mental status.  Intubated for airway protection.  She has PMHx of chronic pain, fibromyalgia, hypothyroidism, diastolic CHF, asthma.  She was febrile on admission with CXR concerning for aspiration.  Pt in with MD at time of visit.   Pt appetite remains good. Per SLP note, diet upgrade was offered, however, pt prefers dysphagia 2 diet texture.   Pt with MASD on inframammary, subpannicular and bilateral inguinal areas per [facility] note on 04/29/16. Pt on bariatric therapeutic mattress with low air loss and heel boots to prevent pressure injuries.   Pt is medically stable for discharge, awaiting SNF placement. RD will sign off and defer further nutrition care to RD at SNF.   Labs reviewed.   Diet Order:  DIET DYS 2 Room service appropriate? Yes; Fluid consistency: Thin Diet - low sodium heart healthy  Skin:  Reviewed, no issues  Last BM:  05/03/16  Height:   Ht Readings from Last 1 Encounters:  04/24/16 5' 8"  (1.727 m)    Weight:   Wt Readings from Last 1 Encounters:  05/05/16 (!) 389 lb (176.4 kg)    Ideal Body Weight:  63.6 kg  BMI:  Body mass index is 59.15 kg/m.  Estimated Nutritional Needs:   Kcal:  1600-1800  Protein:  110-125 grams  Fluid:  1.6-1.8 L  EDUCATION NEEDS:   No education needs identified at this time  Duffy Dantonio A. Jimmye Norman, RD, LDN, CDE Pager: [phone] After hours Pager:  [phone]

## 2016-05-05 NOTE — Progress Notes (Signed)
Occupational Therapy Treatment Patient Details Name: Jillian Eaton MRN: [id number] DOB: 03-16-1950 Today's Date: 05/05/2016    History of present illness Pt is a 66 y/o morbidly obese female who presents s/p seizure with encephalopathy, and aspiration PNA. Pt was intubated in the field (3/4) and extubated 3/7. PMH significant for HTN, asthma, fibromyalgia, heart murmur, DOE/SOB, urinary incontinence.    OT comments  Performed b ue there ex to increase strength for carryover with adls and mobility  Performed ue bathing and dressing in supine with hob elevated.   Follow Up Recommendations  CIR;SNF    Equipment Recommendations       Recommendations for Other Services      Precautions / Restrictions Precautions Precautions: Fall Precaution Comments: Chronic B knee pain Restrictions Weight Bearing Restrictions: No       Mobility Bed Mobility                  Transfers                      Balance                                   ADL           Upper Body Bathing: Supervision/ safety;Set up;Bed level       Upper Body Dressing : Minimal assistance;Bed level                     General ADL Comments: patient was able to participate in bed level adls for ue.       Vision                     Perception     Praxis      Cognition   Behavior During Therapy: WFL for tasks assessed/performed Overall Cognitive Status: Impaired/Different from baseline       Memory: Decreased short-term memory    Safety/Judgement: Decreased awareness of deficits            Exercises General Exercises - Upper Extremity Shoulder Flexion:  (10 reps for 3 sets) Shoulder ABduction: AROM (10 reps times 3 sets) Elbow Flexion: AROM (10 reps times 3 sets) Elbow Extension: AROM (10 reps times 3 sets.)   Shoulder Instructions       General Comments      Pertinent Vitals/ Pain       Pain Assessment: 0-10 Pain Score: 8  Pain  Location: knees Pain Intervention(s): Limited activity within patient's tolerance;Premedicated before session  Home Living                                          Prior Functioning/Environment              Frequency           Progress Toward Goals  OT Goals(current goals can now be found in the care plan section)  Progress towards OT goals: Progressing toward goals  Acute Rehab OT Goals Patient Stated Goal: to get better  Plan      Co-evaluation                 End of Session    OT Visit Diagnosis: Unsteadiness on feet (R26.81);Muscle weakness (generalized) (M62.81)   Activity Tolerance  Patient Left     Nurse Communication          Time: 1308-6578 OT Time Calculation (min): 30 min  Charges: OT General Charges $OT Visit: 1 Procedure OT Treatments $Self Care/Home Management : 8-22 mins $Therapeutic Exercise: 8-22 mins  Clinical judgement   Navdeep Fessenden 05/05/2016, 9:48 AM

## 2016-05-08 ENCOUNTER — Encounter: Payer: Self-pay | Admitting: Adult Health

## 2016-05-08 ENCOUNTER — Other Ambulatory Visit: Payer: Self-pay | Admitting: Internal Medicine

## 2016-05-08 ENCOUNTER — Non-Acute Institutional Stay (SKILLED_NURSING_FACILITY): Payer: Federal, State, Local not specified - PPO | Admitting: Adult Health

## 2016-05-08 DIAGNOSIS — J9611 Chronic respiratory failure with hypoxia: Secondary | ICD-10-CM

## 2016-05-08 DIAGNOSIS — I214 Non-ST elevation (NSTEMI) myocardial infarction: Secondary | ICD-10-CM | POA: Diagnosis not present

## 2016-05-08 DIAGNOSIS — E039 Hypothyroidism, unspecified: Secondary | ICD-10-CM

## 2016-05-08 DIAGNOSIS — E876 Hypokalemia: Secondary | ICD-10-CM

## 2016-05-08 DIAGNOSIS — J189 Pneumonia, unspecified organism: Secondary | ICD-10-CM | POA: Diagnosis not present

## 2016-05-08 DIAGNOSIS — I5042 Chronic combined systolic (congestive) and diastolic (congestive) heart failure: Secondary | ICD-10-CM

## 2016-05-08 DIAGNOSIS — R569 Unspecified convulsions: Secondary | ICD-10-CM

## 2016-05-08 DIAGNOSIS — G894 Chronic pain syndrome: Secondary | ICD-10-CM

## 2016-05-08 DIAGNOSIS — R5381 Other malaise: Secondary | ICD-10-CM

## 2016-05-08 DIAGNOSIS — R1310 Dysphagia, unspecified: Secondary | ICD-10-CM

## 2016-05-08 DIAGNOSIS — I10 Essential (primary) hypertension: Secondary | ICD-10-CM | POA: Diagnosis not present

## 2016-05-08 NOTE — Progress Notes (Signed)
DATE:  05/08/2016   MRN:  [id number]  BIRTHDAY: 1950/12/07  Facility:  Nursing Home Location:  [facility]  Nursing Home Room Number: 308-P  LEVEL OF CARE:  SNF [phone])  Contact Information    Name Relation Home Work Mobile   Arave,Theodore Spouse [phone]     [facility] Daughter [phone]     Humbarger,Theodore Spouse [phone]     Thedora Hinders Daughter [phone]         Code Status History    Date Active Date Inactive Code Status Order ID Comments User Context   04/23/2016 10:05 PM 05/05/2016 10:03 PM Full Code [id number]  Orville Govern, MD ED   03/10/2016  7:08 AM 03/13/2016 12:09 AM Full Code [id number]  Duayne Cal, NP ED   06/07/2013 12:18 AM 06/08/2013  7:49 PM Full Code [id number]  Hillary Bow, DO ED       Chief Complaint  Patient presents with  . Hospitalization Follow-up    HISTORY OF PRESENT ILLNESS:  This is a 65-YO female seen for hospital follow-up.  She was admitted to [facility] on 05/05/2016 following an admission at [facility] 04/23/2016-05/05/2016 due to being found down and unresponsive. She was intubated by EMS. She was given Versed for suspected seizure activity and was incontinent of bowel and bladder. She had fever 103. She was start on Vancomycin/aztreonam/Levaquin. Initial chest x-ray showed findings concerning for aspiration. She was successfully extubated on 04/26/16. She completed 7-day course of antibiotics with Fortaz and Vancomycin. Blood, urine cultures negative. Influenza panel negative. She was evaluated by neurologist. CT head negative. EEG done on 04/24/16 showed diffuse slowing with rhythmic sharp waves in the left parietal region. She was started on Keppra. No evidence of ongoing seizure activity and neurology recommends weaning Keppra after acute illness resolved. Neurology recommends weaning Keppra after acute illness resolved. She has PMH of restrictive lung disease secondary to morbid  obesity S/P gastroplasty in 1985, asthma, chronic pain syndrome on chronic narcotic therapy (history of unintentional narcotic overdose), fibromyalgia, hypothyroidism and chronic diastolic CHF.  She has been admitted for a short-term rehabilitation.   PAST MEDICAL HISTORY:  Past Medical History:  Diagnosis Date  . Anemia   . Asthma   . Chronic headache   . DOE (dyspnea on exertion)    2D ECHO, 02/12/2012 - EF 60-65%, moderate concentric hypertrophy  . Fibromyalgia    nerve pain"left side at waist level" "can't lay on that side without pain" , "HOB elevation helps"  . Heart murmur   . Hematuria - cause not known   . History of kidney stones    x 2 '13, '14 surgery to remove  . Hypertension   . SBO (small bowel obstruction) 06/07/2013  . SOB (shortness of breath)    NUCLEAR STRESS TEST, 03/02/2009 - no ischemic ST changes or symptoms  . Thyroid disease    "goiter"  . Transfusion history    10 yrs+  . Urinary incontinence, functional      CURRENT MEDICATIONS: Reviewed  Patient's Medications  New Prescriptions   No medications on file  Previous Medications   ACETAMINOPHEN (TYLENOL) 650 MG CR TABLET    Take 650 mg by mouth every 6 (six) hours.   ALBUTEROL (PROAIR HFA) 108 (90 BASE) MCG/ACT INHALER    Inhale 2 puffs into the lungs every 6 (six) hours as needed for wheezing or shortness of breath.    ASPIRIN EC 81 MG EC TABLET  Take 1 tablet (81 mg total) by mouth daily.   BUDESONIDE-FORMOTEROL (SYMBICORT) 160-4.5 MCG/ACT INHALER    Inhale 2 puffs into the lungs 2 (two) times daily as needed (shortness of breath.).    CALCIUM CARBONATE (TUMS EX) 750 MG CHEWABLE TABLET    Chew 1 tablet by mouth daily.   CARVEDILOL (COREG) 6.25 MG TABLET    Take 1 tablet (6.25 mg total) by mouth 2 (two) times daily with a meal.   FUROSEMIDE (LASIX) 40 MG TABLET    Take 1 tablet (40 mg total) by mouth daily.   LEVALBUTEROL (XOPENEX) 0.63 MG/3ML NEBULIZER SOLUTION    Take 3 mLs (0.63 mg total) by  nebulization every 4 (four) hours as needed for wheezing or shortness of breath.   LEVETIRACETAM (KEPPRA) 500 MG TABLET    Take 1 tablet (500 mg total) by mouth 2 (two) times daily.   LEVOTHYROXINE (SYNTHROID, LEVOTHROID) 150 MCG TABLET    Take 2 tablets (300 mcg total) by mouth daily before breakfast.   PANTOPRAZOLE (PROTONIX) 40 MG TABLET    Take 1 tablet (40 mg total) by mouth at bedtime.   POTASSIUM CHLORIDE SA (K-DUR,KLOR-CON) 20 MEQ TABLET    Take 1 tablet (20 mEq total) by mouth 2 (two) times daily.   VITAMIN D, ERGOCALCIFEROL, (DRISDOL) 50000 UNITS CAPS CAPSULE    Take 50,000 Units by mouth every 7 (seven) days.  Modified Medications   No medications on file  Discontinued Medications   CALCIUM CARBONATE (CALCIUM 600) 600 MG TABS TABLET    Take 600 mg by mouth 2 (two) times daily with a meal.     Allergies  Allergen Reactions  . Penicillins Other (See Comments)    intolerance  . Oxycodone-Acetaminophen Itching, Nausea And Vomiting and Rash     REVIEW OF SYSTEMS:  GENERAL: no change in appetite, no fatigue, no weight changes, no fever, chills or weakness EYES: Denies change in vision, dry eyes, eye pain, itching or discharge EARS: Denies change in hearing, ringing in ears, or earache NOSE: Denies nasal congestion or epistaxis MOUTH and THROAT: Denies oral discomfort, gingival pain or bleeding, pain from teeth or hoarseness   RESPIRATORY: no cough, SOB, DOE, wheezing, hemoptysis CARDIAC: no chest pain or palpitations GI: no abdominal pain, diarrhea, constipation, heart burn, nausea or vomiting GU: Denies dysuria, frequency, hematuria, incontinence, or discharge PSYCHIATRIC: Denies feeling of depression or anxiety. No report of hallucinations, insomnia, paranoia, or agitation     PHYSICAL EXAMINATION  GENERAL APPEARANCE: Well nourished. In no acute distress. Morbidly obese SKIN:  Skin is warm and dry. There are no suspicious lesions or rash HEAD: Normal in size and  contour. No evidence of trauma EYES: Lids open and close normally. No blepharitis, entropion or ectropion. PERRL. Conjunctivae are clear and sclerae are white. Lenses are without opacity EARS: Pinnae are normal. Patient hears normal voice tunes of the examiner MOUTH and THROAT: Lips are without lesions. Oral mucosa is moist and without lesions. Tongue is normal in shape, size, and color and without lesions NECK: supple, trachea midline, no neck masses, no thyroid tenderness, no thyromegaly LYMPHATICS: no LAN in the neck, no supraclavicular LAN RESPIRATORY: breathing is even & unlabored, BS CTAB CARDIAC: RRR, no murmur,no extra heart sounds, BLE 1+ edema GI: abdomen soft, normal BS, no masses, no tenderness, no hepatomegaly, no splenomegaly EXTREMITIES:  Able to move X 4 extremities PSYCHIATRIC: Alert and oriented X 3. Affect and behavior are appropriate    LABS/RADIOLOGY: Labs reviewed: Basic Metabolic  Panel:  Recent Labs  04/24/16 1826 04/25/16 0250 04/25/16 1816  04/29/16 0536 05/03/16 1154 05/04/16 0749  NA  --  140  --   < > 143 140 141  K  --  2.9*  --   < > 3.5 4.6 4.9  CL  --  105  --   < > 97* 102 103  CO2  --  27  --   < > 32 22 25  GLUCOSE  --  112*  --   < > 85 129* 92  BUN  --  24*  --   < > 15 29* 31*  CREATININE  --  1.14*  --   < > 0.96 1.34* 1.18*  CALCIUM  --  7.1*  --   < > 8.3* 9.0 8.5*  MG 2.0 1.9 1.8  --   --  1.6*  --   PHOS 3.3 3.5 3.6  --   --   --   --   < > = values in this interval not displayed. Liver Function Tests:  Recent Labs  03/10/16 0350 04/23/16 2001  AST 34 34  ALT 11* 14  ALKPHOS 92 90  BILITOT 0.4 0.9  PROT 7.1 7.5  ALBUMIN 3.8 3.8   CBC:  Recent Labs  03/10/16 0350  03/12/16 0416 04/23/16 2001  04/29/16 0536 04/30/16 0736 05/01/16 0434  WBC 4.0  < > 4.7 14.6*  < > 9.3 8.7 10.2  NEUTROABS 2.4  --  2.3 12.5*  --   --   --   --   HGB 13.9  < > 11.0* 14.3  < > 13.8 14.6 13.4  HCT 43.5  < > 34.4* 43.4  < > 42.3 44.2  40.7  MCV 100.2*  < > 97.2 97.1  < > 98.8 97.6 97.8  PLT 96*  < > 95* 160  < > 203 221 290  < > = values in this interval not displayed. Cardiac Enzymes:  Recent Labs  03/10/16 0524 04/23/16 2001  04/24/16 1826 04/25/16 0000 04/25/16 1051  CKTOTAL 623* 201  --   --   --   --   TROPONINI  --   --   < > 4.10* 3.17* 2.03*  < > = values in this interval not displayed. CBG:  Recent Labs  04/27/16 0827 04/27/16 1150 04/27/16 1621  GLUCAP 94 78 83      Ct Head Wo Contrast  Result Date: 04/23/2016 CLINICAL DATA:  Confusion and history of hypertension. EXAM: CT HEAD WITHOUT CONTRAST TECHNIQUE: Contiguous axial images were obtained from the base of the skull through the vertex without intravenous contrast. COMPARISON:  06/10/2015 FINDINGS: Brain: No mass lesion, hemorrhage, hydrocephalus, acute infarct, intra-axial, or extra-axial fluid collection. Vascular: No hyperdense vessel or unexpected calcification. Skull: Normal Sinuses/Orbits: Endotracheal and nasogastric tubes are incompletely imaged. Fluid in the oropharynx likely secondary. Normal orbits and globes. Minimal fluid in the sphenoid sinus. Clear mastoid air cells. Other: None IMPRESSION: 1.  No acute intracranial abnormality. 2. Sinus disease. Electronically Signed   By: Jeronimo Greaves M.D.   On: 04/23/2016 22:51   Dg Chest Port 1 View  Result Date: 04/26/2016 CLINICAL DATA:  Respiratory failure EXAM: PORTABLE CHEST 1 VIEW COMPARISON:  04/25/2016 FINDINGS: Cardiac shadow is again enlarged in size. Endotracheal tube, nasogastric catheter and left-sided central venous line are again seen and stable. The left lung remains clear. Patchy infiltrative changes are again seen on the right with some mild increase in basilar  atelectasis. No bony abnormality is noted. IMPRESSION: Patchy changes on the right with increase in basilar atelectasis. Electronically Signed   By: Alcide Clever M.D.   On: 04/26/2016 07:18   Dg Chest Port 1 View  Result  Date: 04/25/2016 CLINICAL DATA:  Patient admitted 04/23/2016 for seizure and altered mental status. Intubated. EXAM: PORTABLE CHEST 1 VIEW COMPARISON:  04/24/2016 and 04/23/2016. FINDINGS: Support tubes and lines are unchanged. Right worse than left basilar airspace disease is again seen. Aeration in the right lung base is worse than on yesterday's study. Patchy airspace disease in the right mid and upper lung zones is unchanged. There is cardiomegaly. No pneumothorax. IMPRESSION: Support tubes and lines projecting good position. Right greater than left airspace disease has worsened in the right lower lung zone and could be due to atelectasis, aspiration pneumonia. Electronically Signed   By: Drusilla Kanner M.D.   On: 04/25/2016 07:31   Dg Chest Port 1 View  Result Date: 04/24/2016 CLINICAL DATA:  Central line placement EXAM: PORTABLE CHEST 1 VIEW COMPARISON:  04/23/2016 FINDINGS: Endotracheal tube with the tip 6.3 cm above the carina. Nasogastric tube coursing below the diaphragm. Interval placement of a left jugular central venous catheter with the tip projecting over the SVC. Bilateral interstitial and alveolar airspace opacities. More focal right upper lobe airspace disease. No pneumothorax. No pleural effusion. Stable cardiomegaly. No acute osseous abnormality. IMPRESSION: 1. Interval placement of a left jugular central venous catheter with the tip projecting over the SVC. 2. Bilateral patchy interstitial and alveolar airspace opacities more focal in the right upper lobe. Differential considerations include pulmonary edema versus right upper lobe pneumonia. Electronically Signed   By: Elige Ko   On: 04/24/2016 18:30   Dg Chest Port 1 View  Result Date: 04/23/2016 CLINICAL DATA:  Seizure with unresponsiveness EXAM: PORTABLE CHEST 1 VIEW COMPARISON:  08/03/2015 CXR FINDINGS: AP portable semi upright view of the chest was provided. The patient is slightly rotated. There is stable cardiomegaly with  uncoiling of the thoracic aorta. Endotracheal tube tip is satisfactory at 5.1 cm above the carina. A gastric tube is seen extending towards the diaphragm. The tip is excluded. Patchy airspace opacities are seen in the right upper lobe suspicious for aspiration. Minimal bibasilar opacities may reflect areas of atelectasis and/or infiltrate as well. Osteoarthritis of the visualized glenohumeral joints. Skin fold artifact projects over the right lateral humeral head. IMPRESSION: 1. Cardiomegaly with bilateral airspace opacities predominantly in the right upper lobe with lesser degree of atelectasis and/or minimal infiltrate in both lung bases. Findings may reflect aspiration pneumonia. 2. Satisfactory endotracheal tube tip position approximately 5.1 cm above the carina. A gastric tube is incompletely visualized but appears to extend below the diaphragm. Electronically Signed   By: Tollie Eth M.D.   On: 04/23/2016 20:07   Dg Abd Portable 1v  Result Date: 04/24/2016 CLINICAL DATA:  Evaluate orogastric tube placement EXAM: PORTABLE ABDOMEN - 1 VIEW COMPARISON:  June 08, 2013 FINDINGS: The OG tube terminates with the side-port just below the GE junction and the distal tip just within the stomach. IMPRESSION: The side port and distal tip of the OG tube are just below the GE junction in the upper stomach. Electronically Signed   By: Gerome Sam III M.D   On: 04/24/2016 12:03    ASSESSMENT/PLAN:  Physical deconditioning - for rehabilitation, PT and OT, for therapeutic strengthening exercises; fall precautions  Chronic respiratory failure with hypoxemia - was extubated on 04/26/16, was given broad-spectrum  antibiotics and completed 7-day course of Fortaz and Vancomycin, continue Xopenex  PRN, ProAir HFA PRN, Symbicort BID  CAP - S/P parotic spectrum antibiotics and completed a 7 day course of Fortaz and vancomycin; check CBC  Essential hypertension - continue carvedilol 6.25 mg 1 tab by mouth twice a  day  Seizure - evaluated by neurologist, CT of the head negative, EEG done on 04/24/16 showed diffuse slowing with rhythmic sharp waves in the left parietal region; was started on Keppra, no evidence of ongoing seizure activity. Neurology recommends weaning Keppra after acute illness resolved  NSTEMI - 2-D echo showed EF 25-30% with mild MR and mild LA dilatation; continue aspirin and Lasix; cardiology recommended medical management and for repeat echocardiogram in 3 months   Chronic combined systolic and diastolic congestive heart failure - no SOB; continue Lasix 40 mg 1 tab by mouth daily; check BMP  Hypothyroidism - continue Synthroid 300 g 1 tab by mouth daily  Hypokalemia - continue Klor-Con 20 MEQ1 tab by mouth twice a day  Dysphagia - for ST evaluation and treatment; aspiration precautions  Chronic pain syndrome - continue Tylenol arthritis ER 650 mg 1 tab by mouth every 6 hours     Goals of care:  Short-term rehabilitation   Ciclaly Mulcahey C. Medina-Vargas - NP    [employer] [phone]

## 2016-05-09 ENCOUNTER — Non-Acute Institutional Stay (SKILLED_NURSING_FACILITY): Payer: Federal, State, Local not specified - PPO | Admitting: Internal Medicine

## 2016-05-09 ENCOUNTER — Encounter: Payer: Self-pay | Admitting: Internal Medicine

## 2016-05-09 DIAGNOSIS — M797 Fibromyalgia: Secondary | ICD-10-CM

## 2016-05-09 DIAGNOSIS — G40909 Epilepsy, unspecified, not intractable, without status epilepticus: Secondary | ICD-10-CM

## 2016-05-09 DIAGNOSIS — G894 Chronic pain syndrome: Secondary | ICD-10-CM

## 2016-05-09 DIAGNOSIS — K219 Gastro-esophageal reflux disease without esophagitis: Secondary | ICD-10-CM

## 2016-05-09 DIAGNOSIS — E039 Hypothyroidism, unspecified: Secondary | ICD-10-CM | POA: Diagnosis not present

## 2016-05-09 DIAGNOSIS — J9621 Acute and chronic respiratory failure with hypoxia: Secondary | ICD-10-CM | POA: Diagnosis not present

## 2016-05-09 DIAGNOSIS — J984 Other disorders of lung: Secondary | ICD-10-CM

## 2016-05-09 DIAGNOSIS — R5381 Other malaise: Secondary | ICD-10-CM | POA: Diagnosis not present

## 2016-05-09 DIAGNOSIS — I5022 Chronic systolic (congestive) heart failure: Secondary | ICD-10-CM | POA: Diagnosis not present

## 2016-05-09 DIAGNOSIS — J690 Pneumonitis due to inhalation of food and vomit: Secondary | ICD-10-CM | POA: Diagnosis not present

## 2016-05-09 DIAGNOSIS — I480 Paroxysmal atrial fibrillation: Secondary | ICD-10-CM

## 2016-05-09 DIAGNOSIS — F5101 Primary insomnia: Secondary | ICD-10-CM | POA: Diagnosis not present

## 2016-05-09 LAB — CBC AND DIFFERENTIAL
HEMATOCRIT: 46 % (ref 36–46)
HEMOGLOBIN: 15.1 g/dL (ref 12.0–16.0)
NEUTROS ABS: 5 /uL
Platelets: 227 10*3/uL (ref 150–399)
WBC: 7.1 10*3/mL

## 2016-05-09 LAB — BASIC METABOLIC PANEL
BUN: 36 mg/dL — AB (ref 4–21)
Creatinine: 1.3 mg/dL — AB (ref 0.5–1.1)
Glucose: 119 mg/dL
POTASSIUM: 5 mmol/L (ref 3.4–5.3)
SODIUM: 140 mmol/L (ref 137–147)

## 2016-05-09 NOTE — Progress Notes (Signed)
LOCATION: [place]  PCP: Red Christians, MD   Code Status: Full Code  Goals of care: Advanced Directive information Advanced Directives 04/24/2016  Does Patient Have a Medical Advance Directive? No  Would patient like information on creating a medical advance directive? No - Patient declined       Extended Emergency Contact Information Primary Emergency Contact: Teeple,Theodore Address: [place], [place] [address] [place] Phone: [phone] Relation: Spouse Secondary Emergency Contact: [facility] Address: [place], [place] [address] [place] Phone: [phone] Relation: Daughter   Allergies  Allergen Reactions  . Penicillins Other (See Comments)    intolerance  . Oxycodone-Acetaminophen Itching, Nausea And Vomiting and Rash    Chief Complaint  Patient presents with  . New Admit To SNF    New Admission Visit      HPI:  Patient is a 66 y.o. female seen today for short term rehabilitation post hospital admission from 04/23/16-05/05/16 with acute on chronic respiratory failure with hypoxemia from aspiration pneumonia. She required intubation and was placed on iv antibiotics. There were concerns for seizure and she received versed. her keppra was continued. She had acute systolic chf and afib and was seen by cardiology in hospital. Medical management was recommended and she is not a candidate for ablation with her medical co-morbidities. EF 25-30% noted on echocardiogram. She was having loose stool and c.diff stoll was negative. She has PMH of chronic respiratory failure, RLS, chronic pain syndrome, fibromyalgia, hypothyroidism among others. She is seen in her room today.   Review of Systems:  Constitutional: Negative for fever, chills, diaphoresis. Energy level is slowly coming back.  HENT: Negative for headache, congestion,sore throat, difficulty swallowing. Positive for  nasal discharge.  Eyes: Negative for eye pain, blurred vision, double vision and discharge.  Respiratory: Negative for cough and wheezing. Positive for shortness of breath.   Cardiovascular: Negative for chest pain,leg swelling. Positive for occasional palpitations.  Gastrointestinal: Negative for heartburn, nausea, vomiting, abdominal pain,melena. Positive for poor appetite. Last bowel movement this morning with loose stool. Denies rectal bleed.  Genitourinary: Negative for dysuria and flank pain.  Musculoskeletal: Negative for back pain, fall in the facility. Positive for chronic pain to her knees.  Skin: Negative for itching, rash.  Neurological: Positive for occasional dizziness  Psychiatric/Behavioral: Negative for depression. Positive for insomnia.   Past Medical History:  Diagnosis Date  . Anemia   . Asthma   . Chronic headache   . DOE (dyspnea on exertion)    2D ECHO, 02/12/2012 - EF 60-65%, moderate concentric hypertrophy  . Fibromyalgia    nerve pain"left side at waist level" "can't lay on that side without pain" , "HOB elevation helps"  . Heart murmur   . Hematuria - cause not known   . History of kidney stones    x 2 '13, '14 surgery to remove  . Hypertension   . SBO (small bowel obstruction) 06/07/2013  . SOB (shortness of breath)    NUCLEAR STRESS TEST, 03/02/2009 - no ischemic ST changes or symptoms  . Thyroid disease    "goiter"  . Transfusion history    10 yrs+  . Urinary incontinence, functional    Past Surgical History:  Procedure Laterality Date  . CARDIAC CATHETERIZATION  04/04/2010   No significant obstructive coronary artery disease  . CHOLECYSTECTOMY  1990  .  COLONOSCOPY W/ POLYPECTOMY    . COLONOSCOPY WITH PROPOFOL N/A 04/10/2014   Procedure: COLONOSCOPY WITH PROPOFOL;  Surgeon: Beryle Beams, MD;  Location: [facility];  Service: Endoscopy;  Laterality: N/A;  . DIAGNOSTIC LAPAROSCOPY     x2 bowel obstructions(adhesions)  . 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 THYROIDECTOMY    . TUBAL LIGATION  1986   Social History:   reports that she has never smoked. She has never used smokeless tobacco. She reports that she uses drugs, including Oxycodone. She reports that she does not drink alcohol.  Family History  Problem Relation Age of Onset  . Diabetes Mother   . Epilepsy Mother   . Cancer Mother     Breast  . Hypertension Mother   . Breast cancer Mother   . Kidney disease Father   . Diabetes Father   . Hypertension Father   . Asthma Father   . Heart disease Father   . Epilepsy Sister   . Cancer Maternal Grandmother   . Breast cancer Maternal Grandmother   . Cancer Paternal Grandmother   . Breast cancer Paternal Grandmother     Medications: Allergies as of 05/09/2016      Reactions   Penicillins Other (See Comments)   intolerance   Oxycodone-acetaminophen Itching, Nausea And Vomiting, Rash      Medication List       Accurate as of 05/09/16  2:33 PM. Always use your most recent med list.          acetaminophen 650 MG CR tablet Commonly known as:  TYLENOL Take 650 mg by mouth every 6 (six) hours.   aspirin 81 MG EC tablet Take 1 tablet (81 mg total) by mouth daily.   budesonide-formoterol 160-4.5 MCG/ACT inhaler Commonly known as:  SYMBICORT Inhale 2 puffs into the lungs 2 (two) times daily as needed (shortness of breath.).   calcium carbonate 750 MG chewable tablet Commonly known as:  TUMS EX Chew 1 tablet by mouth daily.   carvedilol 6.25 MG tablet Commonly known as:  COREG Take 1 tablet (6.25 mg total) by mouth 2 (two) times daily with a meal.   furosemide 40 MG tablet Commonly known as:  LASIX Take 1 tablet (40 mg total) by mouth daily.   levalbuterol 0.63 MG/3ML nebulizer solution Commonly known as:  XOPENEX Take 3 mLs (0.63 mg total) by nebulization every 4 (four) hours as needed for wheezing or shortness of breath.   levETIRAcetam 500 MG  tablet Commonly known as:  KEPPRA Take 1 tablet (500 mg total) by mouth 2 (two) times daily.   levothyroxine 300 MCG tablet Commonly known as:  SYNTHROID, LEVOTHROID Take 300 mcg by mouth daily before breakfast.   Melatonin 5 MG Tabs Take 1 tablet by mouth at bedtime.   pantoprazole 40 MG tablet Commonly known as:  PROTONIX Take 1 tablet (40 mg total) by mouth at bedtime.   potassium chloride SA 20 MEQ tablet Commonly known as:  K-DUR,KLOR-CON Take 1 tablet (20 mEq total) by mouth 2 (two) times daily.   PROAIR HFA 108 (90 Base) MCG/ACT inhaler Generic drug:  albuterol Inhale 2 puffs into the lungs every 6 (six) hours as needed for wheezing or shortness of breath.   Vitamin D (Ergocalciferol) 50000 units Caps capsule Commonly known as:  DRISDOL Take 50,000 Units by mouth every 7 (seven) days.       Immunizations: Immunization History  Administered Date(s)  Administered  . Influenza-Unspecified 11/19/2014, 03/21/2016  . Pneumococcal Polysaccharide-23 05/01/2016     Physical Exam: Vitals:   05/09/16 1428  BP: (!) 101/54  Pulse: 68  Resp: 20  Temp: 97.8 F (36.6 C)  TempSrc: Oral  SpO2: 94%  Weight: (!) 389 lb (176.4 kg)  Height: 5\' 8"  (1.727 m)   Body mass index is 59.15 kg/m.  General- elderly female, morbidly obese, in no acute distress Head- normocephalic, atraumatic Nose- no maxillary or frontal sinus tenderness, no nasal discharge Throat- moist mucus membrane, normal oropharynx Eyes- PERRLA, EOMI, no pallor, no icterus, no discharge, normal conjunctiva, normal sclera Neck- no cervical lymphadenopathy Cardiovascular- irregular heart rate, no murmur Respiratory- bilateral clear to auscultation, no wheeze, no rhonchi, no crackles, no use of accessory muscles Abdomen- bowel sounds present, soft, non tender, no guarding or rigidity Musculoskeletal- able to move all 4 extremities, limited range of motion to her shoulder and knees, weakness to her legs, leg  edema + Neurological- alert and oriented to person, place and time Skin- warm and dry Psychiatry- normal mood and affect    Labs reviewed: Basic Metabolic Panel:  Recent Labs  04/24/16 1826 04/25/16 0250 04/25/16 1816  04/29/16 0536 05/03/16 1154 05/04/16 0749  NA  --  140  --   < > 143 140 141  K  --  2.9*  --   < > 3.5 4.6 4.9  CL  --  105  --   < > 97* 102 103  CO2  --  27  --   < > 32 22 25  GLUCOSE  --  112*  --   < > 85 129* 92  BUN  --  24*  --   < > 15 29* 31*  CREATININE  --  1.14*  --   < > 0.96 1.34* 1.18*  CALCIUM  --  7.1*  --   < > 8.3* 9.0 8.5*  MG 2.0 1.9 1.8  --   --  1.6*  --   PHOS 3.3 3.5 3.6  --   --   --   --   < > = values in this interval not displayed. Liver Function Tests:  Recent Labs  03/10/16 0350 04/23/16 2001  AST 34 34  ALT 11* 14  ALKPHOS 92 90  BILITOT 0.4 0.9  PROT 7.1 7.5  ALBUMIN 3.8 3.8   No results for input(s): LIPASE, AMYLASE in the last 8760 hours. No results for input(s): AMMONIA in the last 8760 hours. CBC:  Recent Labs  03/10/16 0350  03/12/16 0416 04/23/16 2001  04/29/16 0536 04/30/16 0736 05/01/16 0434  WBC 4.0  < > 4.7 14.6*  < > 9.3 8.7 10.2  NEUTROABS 2.4  --  2.3 12.5*  --   --   --   --   HGB 13.9  < > 11.0* 14.3  < > 13.8 14.6 13.4  HCT 43.5  < > 34.4* 43.4  < > 42.3 44.2 40.7  MCV 100.2*  < > 97.2 97.1  < > 98.8 97.6 97.8  PLT 96*  < > 95* 160  < > 203 221 290  < > = values in this interval not displayed. Cardiac Enzymes:  Recent Labs  03/10/16 0524 04/23/16 2001  04/24/16 1826 04/25/16 0000 04/25/16 1051  CKTOTAL 623* 201  --   --   --   --   TROPONINI  --   --   < > 4.10* 3.17* 2.03*  < > =  values in this interval not displayed. BNP: Invalid input(s): POCBNP CBG:  Recent Labs  04/27/16 0827 04/27/16 1150 04/27/16 1621  GLUCAP 94 78 83    Radiological Exams: Ct Head Wo Contrast  Result Date: 04/23/2016 CLINICAL DATA:  Confusion and history of hypertension. EXAM: CT HEAD  WITHOUT CONTRAST TECHNIQUE: Contiguous axial images were obtained from the base of the skull through the vertex without intravenous contrast. COMPARISON:  06/10/2015 FINDINGS: Brain: No mass lesion, hemorrhage, hydrocephalus, acute infarct, intra-axial, or extra-axial fluid collection. Vascular: No hyperdense vessel or unexpected calcification. Skull: Normal Sinuses/Orbits: Endotracheal and nasogastric tubes are incompletely imaged. Fluid in the oropharynx likely secondary. Normal orbits and globes. Minimal fluid in the sphenoid sinus. Clear mastoid air cells. Other: None IMPRESSION: 1.  No acute intracranial abnormality. 2. Sinus disease. Electronically Signed   By: Abigail Miyamoto M.D.   On: 04/23/2016 22:51   Dg Chest Port 1 View  Result Date: 04/26/2016 CLINICAL DATA:  Respiratory failure EXAM: PORTABLE CHEST 1 VIEW COMPARISON:  04/25/2016 FINDINGS: Cardiac shadow is again enlarged in size. Endotracheal tube, nasogastric catheter and left-sided central venous line are again seen and stable. The left lung remains clear. Patchy infiltrative changes are again seen on the right with some mild increase in basilar atelectasis. No bony abnormality is noted. IMPRESSION: Patchy changes on the right with increase in basilar atelectasis. Electronically Signed   By: Inez Catalina M.D.   On: 04/26/2016 07:18   Dg Chest Port 1 View  Result Date: 04/25/2016 CLINICAL DATA:  Patient admitted 04/23/2016 for seizure and altered mental status. Intubated. EXAM: PORTABLE CHEST 1 VIEW COMPARISON:  04/24/2016 and 04/23/2016. FINDINGS: Support tubes and lines are unchanged. Right worse than left basilar airspace disease is again seen. Aeration in the right lung base is worse than on yesterday's study. Patchy airspace disease in the right mid and upper lung zones is unchanged. There is cardiomegaly. No pneumothorax. IMPRESSION: Support tubes and lines projecting good position. Right greater than left airspace disease has worsened in  the right lower lung zone and could be due to atelectasis, aspiration pneumonia. Electronically Signed   By: Inge Rise M.D.   On: 04/25/2016 07:31   Dg Chest Port 1 View  Result Date: 04/24/2016 CLINICAL DATA:  Central line placement EXAM: PORTABLE CHEST 1 VIEW COMPARISON:  04/23/2016 FINDINGS: Endotracheal tube with the tip 6.3 cm above the carina. Nasogastric tube coursing below the diaphragm. Interval placement of a left jugular central venous catheter with the tip projecting over the SVC. Bilateral interstitial and alveolar airspace opacities. More focal right upper lobe airspace disease. No pneumothorax. No pleural effusion. Stable cardiomegaly. No acute osseous abnormality. IMPRESSION: 1. Interval placement of a left jugular central venous catheter with the tip projecting over the SVC. 2. Bilateral patchy interstitial and alveolar airspace opacities more focal in the right upper lobe. Differential considerations include pulmonary edema versus right upper lobe pneumonia. Electronically Signed   By: Kathreen Devoid   On: 04/24/2016 18:30   Dg Chest Port 1 View  Result Date: 04/23/2016 CLINICAL DATA:  Seizure with unresponsiveness EXAM: PORTABLE CHEST 1 VIEW COMPARISON:  08/03/2015 CXR FINDINGS: AP portable semi upright view of the chest was provided. The patient is slightly rotated. There is stable cardiomegaly with uncoiling of the thoracic aorta. Endotracheal tube tip is satisfactory at 5.1 cm above the carina. A gastric tube is seen extending towards the diaphragm. The tip is excluded. Patchy airspace opacities are seen in the right upper lobe suspicious for  aspiration. Minimal bibasilar opacities may reflect areas of atelectasis and/or infiltrate as well. Osteoarthritis of the visualized glenohumeral joints. Skin fold artifact projects over the right lateral humeral head. IMPRESSION: 1. Cardiomegaly with bilateral airspace opacities predominantly in the right upper lobe with lesser degree of  atelectasis and/or minimal infiltrate in both lung bases. Findings may reflect aspiration pneumonia. 2. Satisfactory endotracheal tube tip position approximately 5.1 cm above the carina. A gastric tube is incompletely visualized but appears to extend below the diaphragm. Electronically Signed   By: Ashley Royalty M.D.   On: 04/23/2016 20:07   Dg Abd Portable 1v  Result Date: 04/24/2016 CLINICAL DATA:  Evaluate orogastric tube placement EXAM: PORTABLE ABDOMEN - 1 VIEW COMPARISON:  June 08, 2013 FINDINGS: The OG tube terminates with the side-port just below the GE junction and the distal tip just within the stomach. IMPRESSION: The side port and distal tip of the OG tube are just below the GE junction in the upper stomach. Electronically Signed   By: Dorise Bullion III M.D   On: 04/24/2016 12:03    Assessment/Plan  Physical deconditioning Will have her work with physical therapy and occupational therapy team to help with gait training and muscle strengthening exercises.fall precautions. Skin care. Encourage to be out of bed.   Acute on chronic respiratory failure Stable breathing at present, has restrictive lung disease. Place on her 2 l o2 (per home regimen). continue broncholdilator rx  Aspiration pneumonia Completed antibiotics and clinically stable at present. Monitor clinically  Chronic systolic chf EF 63-89%. Continue lasix 40 mg daily and carvedilol 6.25 mg bid. Continue kcl supplement  Seizure disorder On keppra 500 mg bid. Had required versed in ED. Remains seizure free. Plan is for her keppra to be weaned off. Change keppra to 500 mg daily for 1 week and then discontinue. Monitor for seizures  afib Controlled HR, continue aspirin ec 81 mg daily and coreg.   Insomnia Per pt taking lunesta at home. She has not received it since hospitalization. Start melatonin 5 mg qhs and monitor  gerd Stable symptom, continue protonix 40 mg daily  Hypothyroidism Lab Results  Component Value  Date   TSH 9.239 (H) 04/25/2016   On levothyroxine 300 mcg daily, check tsh in 4 weeks  Chronic pain syndrome On tylenol 650 mg qid. Per patient was taking oxycodone at home and followed in pain clinic. Get PMR consult to evaluate further  Fibromyalgia Was on cymbalta at home per pt, to review records from pain clinic. PMR consult   Goals of care: short term rehabilitation   Labs/tests ordered: cbc, bmp  Family/ staff Communication: reviewed care plan with patient and nursing supervisor    Blanchie Serve, MD Internal Medicine [place], [place] [address] Cell Phone (Monday-Friday 8 am - 5 pm): [phone] On Call: [phone] and follow prompts after 5 pm and on weekends Office Phone: [phone] Office Fax: [phone]

## 2016-05-16 LAB — BASIC METABOLIC PANEL
BUN: 29 mg/dL — AB (ref 4–21)
CREATININE: 1.1 mg/dL (ref 0.5–1.1)
Glucose: 97 mg/dL
POTASSIUM: 4.8 mmol/L (ref 3.4–5.3)
Sodium: 140 mmol/L (ref 137–147)

## 2016-05-18 ENCOUNTER — Non-Acute Institutional Stay (SKILLED_NURSING_FACILITY): Payer: Federal, State, Local not specified - PPO | Admitting: Adult Health

## 2016-05-18 ENCOUNTER — Encounter: Payer: Self-pay | Admitting: Adult Health

## 2016-05-18 DIAGNOSIS — G40909 Epilepsy, unspecified, not intractable, without status epilepticus: Secondary | ICD-10-CM | POA: Diagnosis not present

## 2016-05-18 DIAGNOSIS — R5381 Other malaise: Secondary | ICD-10-CM

## 2016-05-18 DIAGNOSIS — I214 Non-ST elevation (NSTEMI) myocardial infarction: Secondary | ICD-10-CM | POA: Diagnosis not present

## 2016-05-18 DIAGNOSIS — I10 Essential (primary) hypertension: Secondary | ICD-10-CM | POA: Diagnosis not present

## 2016-05-18 DIAGNOSIS — G894 Chronic pain syndrome: Secondary | ICD-10-CM | POA: Diagnosis not present

## 2016-05-18 DIAGNOSIS — E876 Hypokalemia: Secondary | ICD-10-CM

## 2016-05-18 DIAGNOSIS — E039 Hypothyroidism, unspecified: Secondary | ICD-10-CM | POA: Diagnosis not present

## 2016-05-18 DIAGNOSIS — I5022 Chronic systolic (congestive) heart failure: Secondary | ICD-10-CM

## 2016-05-18 DIAGNOSIS — I480 Paroxysmal atrial fibrillation: Secondary | ICD-10-CM

## 2016-05-18 DIAGNOSIS — J9611 Chronic respiratory failure with hypoxia: Secondary | ICD-10-CM | POA: Diagnosis not present

## 2016-05-18 DIAGNOSIS — F5101 Primary insomnia: Secondary | ICD-10-CM | POA: Diagnosis not present

## 2016-05-18 NOTE — Progress Notes (Signed)
Patient ID: Jillian Eaton, female   DOB: 03-11-1950, 66 y.o.   MRN: [id number]    DATE:   05/18/16  MRN:  [id number]  BIRTHDAY: 1950/06/17  Facility:  Nursing Home Location:  [facility]  Nursing Home Room Number: 308-P  LEVEL OF CARE:  [phone])  Contact Information    Name Relation Home Work Mobile   Coltrane,Jillian Eaton [phone]     [facility] Daughter [phone]     Inman,Jillian Eaton [phone]     Jillian Eaton Daughter [phone]         Code Status History    Date Active Date Inactive Code Status Order ID Comments User Context   04/23/2016 10:05 PM 05/05/2016 10:03 PM Full Code [id number]  Orville Govern, MD ED   03/10/2016  7:08 AM 03/13/2016 12:09 AM Full Code [id number]  Duayne Cal, NP ED   06/07/2013 12:18 AM 06/08/2013  7:49 PM Full Code [id number]  Hillary Bow, DO ED       Chief Complaint  Patient presents with  . Discharge Note    HISTORY OF PRESENT ILLNESS:  This is a 66-YO female who is for discharge home  With Home health PT, OT, SW, Nursing and CNA.  She was admitted to [facility] on 05/05/2016 following an admission at [facility] 04/23/2016-05/05/2016 due to being found down and unresponsive. She was intubated by EMS. She was given Versed for suspected seizure activity and was incontinent of bowel and bladder. She had fever 103. She was start on Vancomycin/aztreonam/Levaquin. Initial chest x-ray showed findings concerning for aspiration. She was successfully extubated on 04/26/16. She completed 7-day course of antibiotics with Fortaz and Vancomycin. Blood, urine cultures negative. Influenza panel negative. She was evaluated by neurologist. CT head negative. EEG done on 04/24/16 showed diffuse slowing with rhythmic sharp waves in the left parietal region. She was started on Keppra. No evidence of ongoing seizure activity and neurology recommends weaning Keppra after acute illness resolved. Neurology  recommends weaning Keppra after acute illness resolved. Keppra has recently been discontinued.  She has PMH of restrictive lung disease secondary to morbid obesity S/P gastroplasty in 1985, asthma, chronic pain syndrome on chronic narcotic therapy (history of unintentional narcotic overdose), fibromyalgia, hypothyroidism and chronic diastolic CHF.  Patient was admitted to this facility for short-term rehabilitation after the patient's recent hospitalization.  Patient has completed SNF rehabilitation and therapy has cleared the patient for discharge.   PAST MEDICAL HISTORY:  Past Medical History:  Diagnosis Date  . Anemia   . Asthma   . Chronic headache   . DOE (dyspnea on exertion)    2D ECHO, 02/12/2012 - EF 60-65%, moderate concentric hypertrophy  . Fibromyalgia    nerve pain"left side at waist level" "can't lay on that side without pain" , "HOB elevation helps"  . Heart murmur   . Hematuria - cause not known   . History of kidney stones    x 2 '13, '14 surgery to remove  . Hypertension   . SBO (small bowel obstruction) 06/07/2013  . SOB (shortness of breath)    NUCLEAR STRESS TEST, 03/02/2009 - no ischemic ST changes or symptoms  . Thyroid disease    "goiter"  . Transfusion history    10 yrs+  . Urinary incontinence, functional      CURRENT MEDICATIONS: Reviewed  Patient's Medications  New Prescriptions   No medications on file  Previous Medications   ACETAMINOPHEN (TYLENOL) 650 MG CR TABLET  Take 650 mg by mouth every 6 (six) hours. 6A, 12P, 6P, 12A   ALBUTEROL (PROAIR HFA) 108 (90 BASE) MCG/ACT INHALER    Inhale 2 puffs into the lungs every 6 (six) hours as needed for wheezing or shortness of breath.    ASPIRIN EC 81 MG EC TABLET    Take 1 tablet (81 mg total) by mouth daily.   BUDESONIDE-FORMOTEROL (SYMBICORT) 160-4.5 MCG/ACT INHALER    Inhale 2 puffs into the lungs 2 (two) times daily as needed (shortness of breath.).    CALCIUM CARBONATE (TUMS EX) 750 MG CHEWABLE  TABLET    Chew 1 tablet by mouth daily.   CARVEDILOL (COREG) 6.25 MG TABLET    Take 1 tablet (6.25 mg total) by mouth 2 (two) times daily with a meal.   FUROSEMIDE (LASIX) 40 MG TABLET    Take 1 tablet (40 mg total) by mouth daily.   LEVALBUTEROL (XOPENEX) 0.63 MG/3ML NEBULIZER SOLUTION    Take 3 mLs (0.63 mg total) by nebulization every 4 (four) hours as needed for wheezing or shortness of breath.   LEVOTHYROXINE (SYNTHROID, LEVOTHROID) 300 MCG TABLET    Take 300 mcg by mouth daily before breakfast.   MAGNESIUM HYDROXIDE (MILK OF MAGNESIA) 400 MG/5ML SUSPENSION    Take 30 mLs by mouth daily as needed for mild constipation. If no BM in 3 days   MELATONIN 5 MG TABS    Take 1 tablet by mouth at bedtime.   MENTHOL, TOPICAL ANALGESIC, (BIOFREEZE) 4 % GEL    Apply 1 application topically every 8 (eight) hours. Apply to left shoulder and both knees.  Hold for rash or skin irritation.   METHOCARBAMOL (ROBAXIN) 500 MG TABLET    Take 500 mg by mouth 2 (two) times daily as needed for muscle spasms.   MIRABEGRON ER (MYRBETRIQ) 50 MG TB24 TABLET    Take 50 mg by mouth daily.   OXYGEN    Inhale 2 L/min into the lungs continuous.   PANTOPRAZOLE (PROTONIX) 40 MG TABLET    Take 1 tablet (40 mg total) by mouth at bedtime.   POTASSIUM CHLORIDE SA (K-DUR,KLOR-CON) 20 MEQ TABLET    Take 1 tablet (20 mEq total) by mouth 2 (two) times daily.   VITAMIN D, ERGOCALCIFEROL, (DRISDOL) 50000 UNITS CAPS CAPSULE    Take 50,000 Units by mouth every 7 (seven) days.   ZINC OXIDE 20 % OINTMENT    Apply 1 application topically 3 (three) times daily. To be applied to groin every shift by charge nurse  Modified Medications   No medications on file  Discontinued Medications   LEVETIRACETAM (KEPPRA) 500 MG TABLET    Take 1 tablet (500 mg total) by mouth 2 (two) times daily.     Allergies  Allergen Reactions  . Penicillins Other (See Comments)    intolerance  . Oxycodone-Acetaminophen Itching, Nausea And Vomiting and Rash      REVIEW OF SYSTEMS:  GENERAL: no change in appetite, no fatigue, no weight changes, no fever, chills or weakness EYES: Denies change in vision, dry eyes, eye pain, itching or discharge EARS: Denies change in hearing, ringing in ears, or earache NOSE: Denies nasal congestion or epistaxis MOUTH and THROAT: Denies oral discomfort, gingival pain or bleeding, pain from teeth or hoarseness   RESPIRATORY: no cough, SOB, DOE, wheezing, hemoptysis CARDIAC: no chest pain or palpitations GI: no abdominal pain, diarrhea, constipation, heart burn, nausea or vomiting GU: Denies dysuria, frequency, hematuria, incontinence, or discharge PSYCHIATRIC: Denies feeling of  depression or anxiety. No report of hallucinations, insomnia, paranoia, or agitation     PHYSICAL EXAMINATION  GENERAL APPEARANCE: Well nourished. In no acute distress. Morbidly obese SKIN:  Skin is warm and dry. There are no suspicious lesions or rash HEAD: Normal in size and contour. No evidence of trauma EYES: Lids open and close normally. No blepharitis, entropion or ectropion. PERRL. Conjunctivae are clear and sclerae are white. Lenses are without opacity EARS: Pinnae are normal. Patient hears normal voice tunes of the examiner MOUTH and THROAT: Lips are without lesions. Oral mucosa is moist and without lesions. Tongue is normal in shape, size, and color and without lesions NECK: supple, trachea midline, no neck masses, no thyroid tenderness, no thyromegaly LYMPHATICS: no LAN in the neck, no supraclavicular LAN RESPIRATORY: breathing is even & unlabored, BS CTAB CARDIAC: RRR, no murmur,no extra heart sounds, BLE 1+ edema GI: abdomen soft, normal BS, no masses, no tenderness, no hepatomegaly, no splenomegaly EXTREMITIES:  Able to move X 4 extremities PSYCHIATRIC: Alert and oriented X 3. Affect and behavior are appropriate    LABS/RADIOLOGY: Labs reviewed: Basic Metabolic Panel:  Recent Labs  16/10/96 1826  04/25/16 0250 04/25/16 1816  04/29/16 0536 05/03/16 1154 05/04/16 0749 05/09/16 05/16/16  NA  --  140  --   < > 143 140 141 140 140  K  --  2.9*  --   < > 3.5 4.6 4.9 5.0 4.8  CL  --  105  --   < > 97* 102 103  --   --   CO2  --  27  --   < > 32 22 25  --   --   GLUCOSE  --  112*  --   < > 85 129* 92  --   --   BUN  --  24*  --   < > 15 29* 31* 36* 29*  CREATININE  --  1.14*  --   < > 0.96 1.34* 1.18* 1.3* 1.1  CALCIUM  --  7.1*  --   < > 8.3* 9.0 8.5*  --   --   MG 2.0 1.9 1.8  --   --  1.6*  --   --   --   PHOS 3.3 3.5 3.6  --   --   --   --   --   --   < > = values in this interval not displayed. Liver Function Tests:  Recent Labs  03/10/16 0350 04/23/16 2001  AST 34 34  ALT 11* 14  ALKPHOS 92 90  BILITOT 0.4 0.9  PROT 7.1 7.5  ALBUMIN 3.8 3.8   CBC:  Recent Labs  03/12/16 0416 04/23/16 2001  04/29/16 0536 04/30/16 0736 05/01/16 0434 05/09/16  WBC 4.7 14.6*  < > 9.3 8.7 10.2 7.1  NEUTROABS 2.3 12.5*  --   --   --   --  5  HGB 11.0* 14.3  < > 13.8 14.6 13.4 15.1  HCT 34.4* 43.4  < > 42.3 44.2 40.7 46  MCV 97.2 97.1  < > 98.8 97.6 97.8  --   PLT 95* 160  < > 203 221 290 227  < > = values in this interval not displayed. Cardiac Enzymes:  Recent Labs  03/10/16 0524 04/23/16 2001  04/24/16 1826 04/25/16 0000 04/25/16 1051  CKTOTAL 623* 201  --   --   --   --   TROPONINI  --   --   < >  4.10* 3.17* 2.03*  < > = values in this interval not displayed. CBG:  Recent Labs  04/27/16 0827 04/27/16 1150 04/27/16 1621  GLUCAP 94 78 83      Ct Head Wo Contrast  Result Date: 04/23/2016 CLINICAL DATA:  Confusion and history of hypertension. EXAM: CT HEAD WITHOUT CONTRAST TECHNIQUE: Contiguous axial images were obtained from the base of the skull through the vertex without intravenous contrast. COMPARISON:  06/10/2015 FINDINGS: Brain: No mass lesion, hemorrhage, hydrocephalus, acute infarct, intra-axial, or extra-axial fluid collection. Vascular: No hyperdense  vessel or unexpected calcification. Skull: Normal Sinuses/Orbits: Endotracheal and nasogastric tubes are incompletely imaged. Fluid in the oropharynx likely secondary. Normal orbits and globes. Minimal fluid in the sphenoid sinus. Clear mastoid air cells. Other: None IMPRESSION: 1.  No acute intracranial abnormality. 2. Sinus disease. Electronically Signed   By: Jeronimo Greaves M.D.   On: 04/23/2016 22:51   Dg Chest Port 1 View  Result Date: 04/26/2016 CLINICAL DATA:  Respiratory failure EXAM: PORTABLE CHEST 1 VIEW COMPARISON:  04/25/2016 FINDINGS: Cardiac shadow is again enlarged in size. Endotracheal tube, nasogastric catheter and left-sided central venous line are again seen and stable. The left lung remains clear. Patchy infiltrative changes are again seen on the right with some mild increase in basilar atelectasis. No bony abnormality is noted. IMPRESSION: Patchy changes on the right with increase in basilar atelectasis. Electronically Signed   By: Alcide Clever M.D.   On: 04/26/2016 07:18   Dg Chest Port 1 View  Result Date: 04/25/2016 CLINICAL DATA:  Patient admitted 04/23/2016 for seizure and altered mental status. Intubated. EXAM: PORTABLE CHEST 1 VIEW COMPARISON:  04/24/2016 and 04/23/2016. FINDINGS: Support tubes and lines are unchanged. Right worse than left basilar airspace disease is again seen. Aeration in the right lung base is worse than on yesterday's study. Patchy airspace disease in the right mid and upper lung zones is unchanged. There is cardiomegaly. No pneumothorax. IMPRESSION: Support tubes and lines projecting good position. Right greater than left airspace disease has worsened in the right lower lung zone and could be due to atelectasis, aspiration pneumonia. Electronically Signed   By: Drusilla Kanner M.D.   On: 04/25/2016 07:31   Dg Chest Port 1 View  Result Date: 04/24/2016 CLINICAL DATA:  Central line placement EXAM: PORTABLE CHEST 1 VIEW COMPARISON:  04/23/2016 FINDINGS:  Endotracheal tube with the tip 6.3 cm above the carina. Nasogastric tube coursing below the diaphragm. Interval placement of a left jugular central venous catheter with the tip projecting over the SVC. Bilateral interstitial and alveolar airspace opacities. More focal right upper lobe airspace disease. No pneumothorax. No pleural effusion. Stable cardiomegaly. No acute osseous abnormality. IMPRESSION: 1. Interval placement of a left jugular central venous catheter with the tip projecting over the SVC. 2. Bilateral patchy interstitial and alveolar airspace opacities more focal in the right upper lobe. Differential considerations include pulmonary edema versus right upper lobe pneumonia. Electronically Signed   By: Elige Ko   On: 04/24/2016 18:30   Dg Chest Port 1 View  Result Date: 04/23/2016 CLINICAL DATA:  Seizure with unresponsiveness EXAM: PORTABLE CHEST 1 VIEW COMPARISON:  08/03/2015 CXR FINDINGS: AP portable semi upright view of the chest was provided. The patient is slightly rotated. There is stable cardiomegaly with uncoiling of the thoracic aorta. Endotracheal tube tip is satisfactory at 5.1 cm above the carina. A gastric tube is seen extending towards the diaphragm. The tip is excluded. Patchy airspace opacities are seen in the right upper  lobe suspicious for aspiration. Minimal bibasilar opacities may reflect areas of atelectasis and/or infiltrate as well. Osteoarthritis of the visualized glenohumeral joints. Skin fold artifact projects over the right lateral humeral head. IMPRESSION: 1. Cardiomegaly with bilateral airspace opacities predominantly in the right upper lobe with lesser degree of atelectasis and/or minimal infiltrate in both lung bases. Findings may reflect aspiration pneumonia. 2. Satisfactory endotracheal tube tip position approximately 5.1 cm above the carina. A gastric tube is incompletely visualized but appears to extend below the diaphragm. Electronically Signed   By: Tollie Eth  M.D.   On: 04/23/2016 20:07   Dg Abd Portable 1v  Result Date: 04/24/2016 CLINICAL DATA:  Evaluate orogastric tube placement EXAM: PORTABLE ABDOMEN - 1 VIEW COMPARISON:  June 08, 2013 FINDINGS: The OG tube terminates with the side-port just below the GE junction and the distal tip just within the stomach. IMPRESSION: The side port and distal tip of the OG tube are just below the GE junction in the upper stomach. Electronically Signed   By: Gerome Sam III M.D   On: 04/24/2016 12:03    ASSESSMENT/PLAN:  Physical deconditioning - for Home health PT and OT, for therapeutic strengthening exercises; fall precautions  Chronic respiratory failure with hypoxemia -  continue Xopenex  PRN, ProAir HFA PRN, Symbicort BID  CAP - resolved  Insomnia - recently started on Melatonin 5 mg PO Q HS  Essential hypertension - well-controlled;  continue carvedilol 6.25 mg 1 tab by mouth twice a day  Seizure - Neurology recommended to discontinue Keppra   NSTEMI -  continue aspirin and Lasix; cardiology recommended medical management and for repeat echocardiogram in 3 months   Chronic combined systolic and diastolic congestive heart failure - no SOB; continue Lasix 40 mg 1 tab by mouth daily  Hypothyroidism - continue Synthroid 300 g 1 tab by mouth daily  Hypokalemia - continue Klor-Con 20 MEQ1 tab by mouth twice a day Lab Results  Component Value Date   K 4.8 05/16/2016   Chronic pain syndrome - well-controlled; continue Tylenol arthritis ER 650 mg 1 tab by mouth every 6 hours  PAF - rate-controlled;  continue carvedilol 6.25 mg 1 tab by mouth twice a day    I have filled out patient's discharge paperwork and written prescriptions.  Patient will receive home health PT, OT, SW, Nursing and CNA.  DME provided:  Standard wheelchair with elevating leg rests, cushion, brake extensions, anti-tippers and bariatric bedside commode  Total discharge time: Greater than 30 minutes Greater than 50% was  spent in counseling and coordination of care with the patient.   Discharge time involved coordination of the discharge process with social worker, nursing staff and therapy department. Medical justification for home health services/DME verified.    Jillian Eaton - NP    [employer] [phone]

## 2016-05-31 ENCOUNTER — Ambulatory Visit: Payer: Federal, State, Local not specified - PPO | Admitting: Internal Medicine

## 2016-06-06 ENCOUNTER — Ambulatory Visit (INDEPENDENT_AMBULATORY_CARE_PROVIDER_SITE_OTHER): Payer: Federal, State, Local not specified - PPO | Admitting: Internal Medicine

## 2016-06-06 ENCOUNTER — Other Ambulatory Visit: Payer: Self-pay

## 2016-06-06 ENCOUNTER — Encounter: Payer: Self-pay | Admitting: Internal Medicine

## 2016-06-06 VITALS — BP 124/77 | HR 74 | Ht 67.0 in

## 2016-06-06 DIAGNOSIS — I272 Pulmonary hypertension, unspecified: Secondary | ICD-10-CM

## 2016-06-06 DIAGNOSIS — I5043 Acute on chronic combined systolic (congestive) and diastolic (congestive) heart failure: Secondary | ICD-10-CM

## 2016-06-06 DIAGNOSIS — R0602 Shortness of breath: Secondary | ICD-10-CM

## 2016-06-06 MED ORDER — FUROSEMIDE 40 MG PO TABS: 40.0000 mg | ORAL_TABLET | Freq: Every day | ORAL | 1 refills | Status: DC

## 2016-06-06 MED ORDER — LISINOPRIL 2.5 MG PO TABS: 2.5000 mg | ORAL_TABLET | Freq: Every day | ORAL | 3 refills | Status: DC

## 2016-06-06 NOTE — Patient Instructions (Addendum)
Your physician has recommended you make the following change in your medication:  -- START lisinopril 2.5mg  once daily   Your physician recommends that you schedule a follow-up appointment in July 2018

## 2016-06-06 NOTE — Progress Notes (Signed)
OFFICE NOTE  Chief Complaint:  Follow-up hospitalization  Primary Care Physician: Red Christians, MD  HPI:  Jillian Eaton is a 66 year old female I saw a few weeks ago with a history of super morbid obesity, fibromyalgia and increasing shortness of breath. She had a cath in 2012 which showed normal coronaries, mildly elevating filling pressures, and diastolic pressures with a mean PA of 31. This correlated with her echocardiogram when RV systolic pressures in 3846 were 49 on echo. A repeat echocardiogram was just performed which demonstrated a preserved LVEF of 60-65% with moderate concentric LVH. There was mild to moderate increase in pulmonary artery pressure with peak at 51, which has not significantly changed from her study 1 year ago. Although the pressures look very similar her shortness of breath has been increasing significantly, and I recommended that she wear oxygen at night since she had that at home which does seem to be helping her at least feel better during the day as I suspect she has sleep apnea. She has been tested before which was apparently negative and is considering retesting at some point in the future. Overall I think the main issue obviously is weight, and she unfortunately says that she is not a candidate for gastric bypass and therefore it is a very difficult situation.  I referred her back to her pulmonologist in [facility] for ongoing evaluation of pulmonary hypertension. She tells me that she was then referred to [facility] and saw a specialist there who did another right heart catheterization but did not recommend any medications other than better blood pressure control.   In addition she had problems with kidney stones and underwent 2 operations regarding tthis.  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  She is now dependent on thyroid medication. Unfortunately she's not been able to lose weight, but her weight is fairly  stable around 400 pounds as is her shortness of breath.  Cath in 2012:  LEFT HEART CATHETERIZATION  OPERATOR: [place] Jacquelyn Shadrick, MD, and Rolland Porter, MD  INDICATION: Dyspnea on exertion and chest pressure.  HISTORY OF PRESENT ILLNESS: Ms. Mccrae is a morbidly obese (BMI greater than 107) female with a history of failed gastric bypass x2 with an increasing weight gain and increasing shortness of breath as well as new- onset dyspnea on exertion. She reports that she can only walk about 10 feet before she becomes short of breath and has chest pressure which she says get better with rest. She has numerous cardiac risk factors and given the high likelihood of false positive stress test, I have referred her for cardiac catheterization, both left and right heart as she has had elevated RVSP on echocardiogram of approximately 30 to 31 mmHg.  PROCEDURE: The patient was brought into the cardiac catheterization lab, sterilely prepped and draped in the usual fashion. The area around the right femoral artery and right brachial vein were cleansed and draped to allow an attempt at radial arterial and brachiocephalic venous access. IV was not able to be obtained prior to the procedure given her body habitus. With difficulty in assessing the vein, the ultrasound was eventually used to identify the right brachiocephalic vein, however, cannulation with needle and wire was not possible as the vein was very small in caliber. After mild local bleeding was controlled, we did turn our attention to the right femoral vein and with great difficulty using the ultrasound, the right femoral vein was accessed by Dr.  Ellyn Hack using a straight wire and a needle. After venous access was obtained, the attention was turned to the right radial artery by Dr. Ellyn Hack and simultaneous to that right heart catheterization was performed by myself without any difficulty. The right radial artery was  successfully cannulated and subsequently left coronary artery system was selectively injected with a 5-French TIG 4.0 catheter, however the right coronary artery could not be cannulated with the TIG catheter and was eventually cannulated with a JR-4 catheter. LV pressure was measured with a pigtail catheter. Estimated blood loss was about 30 mL. There were no acute complications. The patient received a total of 9 mg of Versed throughout the procedure as well as 225 mcg of fentanyl and was at no point greater than moderately sedated. She received 5000 units of heparin about 10 mL of a radial cocktail.  FINDINGS: 1. Left main - short, no disease. 2. LAD - no significant disease. 3. Left circumflex, no significant disease. 4. RCA - dominant, no disease, large-caliber vessel. 5. LVEDP = 20 mmHg. 6. RA - 12. 7. RV 38/12. 8. PA - 43/19 (31). 9. PCWP - 24. 10.TPG - 7. 11.Fick cardiac output/Fick cardiac index - 10.56/3.84. 12.Thermodilution cardiac output/thermodilution cardiac index -  6.78/2.47. 13.Aortic saturation - 94%. 14.PA saturation - 68%.  IMPRESSION: 1. No significant obstructive coronary artery disease. 2. LVEDP = 20 mmHg. 3. Borderline pulmonary venous hypertension. 4. High cardiac output.  [name] returns today for follow-up. She reports that her shortness of breath has not significantly worsened, in fact, possibly is slightly better. She did have thyroid surgery last year at [facility] and apparently underwent right heart catheterization prior to that. I do not have those records immediately available. Unfortunately she continues to maintain her weight and has not been able to lose any. She is complaining of some numbness and tingling in her feet which is likely neuropathy. She is on medication including Lyrica which she takes for fibromyalgia.  [name] returns today for follow-up. She recently is been having more shortness of breath  and lower extremity swelling. She pointed out edema in her legs with very small blisters and some chronic venous stasis changes. She is not currently on a diuretic. She recently saw another new primary care provider with the [facility]. She was noted to be started on lisinopril 40 mg daily, which she is taking in addition to losartan 100 mg daily. The notes do not indicate from her office visit why she was started on lisinopril, but I can see through care everywhere that this was ordered by her primary care provider. She also takes amlodipine for blood pressure control. Her blood pressure was elevated initially at 178/94, but after resting came down to 120/78 and is at goal today. It is unusual, however to use both ACE inhibitors and ARBs.  07/02/2015  Mrs. Peine returns today for follow-up. She underwent a repeat echocardiogram for progressive dyspnea and exertion which showed a preserved LVEF of 6065% however there is moderate to severe pulmonary hypertension with an RVSP of 64 mmHg. This is increased about 10 mmHg compared to her prior study. Her mean pulmonary pressure by catheterization in 2012 was only 31 mmHg. I suspect that progressive pulmonary hypertension as a cause of her worsening shortness of breath. In fact, during recent hospitalization for surgery she required discharged with oxygen. She says she rarely uses oxygen at home but notes that she is hypoxic often when she checks her oxygen  levels. At her last office visit I recommended Lasix which she has been using sparingly. She has problems with incontinence and she does report an improvement in her swelling with it but does not notice significant change in her shortness of breath.  06/06/2016  Mrs. Kicklighter returns from hospital follow-up. She was just admitted after an admission in January for unintentional narcotic overdose. She had unresponsiveness and possible aspiration. There was a second admission for a similar presentation  with respiratory failure, fever, sepsis and ultimately she was found to have a new onset cardiomyopathy with EF as low as 25%. Was felt that this was nonischemic. She was having intermittent atrial tachycardia and was felt that this might need to be managed with antiarrhythmic therapy however it seems to have resolved with carvedilol. She was placed on diuretics and reports that her breathing is close to baseline. She remains hypoxic with an O2 saturation 93% however was on home oxygen is result of her severe pulmonary hypertension. She does not feel that she needs to use the oxygen very regularly. From a heart failure standpoint she is on carvedilol, aspirin, furosemide and not currently on an ACE inhibitor, ARB or Entresto. Recent testing a renal function shows normal creatinine.  PMHx:  Past Medical History:  Diagnosis Date  . Anemia   . Asthma   . Chronic headache   . DOE (dyspnea on exertion)    2D ECHO, 02/12/2012 - EF 60-65%, moderate concentric hypertrophy  . Fibromyalgia    nerve pain"left side at waist level" "can't lay on that side without pain" , "HOB elevation helps"  . Heart murmur   . Hematuria - cause not known   . History of kidney stones    x 2 '13, '14 surgery to remove  . Hypertension   . SBO (small bowel obstruction) ([place]) 06/07/2013  . SOB (shortness of breath)    NUCLEAR STRESS TEST, 03/02/2009 - no ischemic ST changes or symptoms  . Thyroid disease    "goiter"  . Transfusion history    10 yrs+  . Urinary incontinence, functional     Past Surgical History:  Procedure Laterality Date  . CARDIAC CATHETERIZATION  04/04/2010   No significant obstructive coronary artery disease  . CHOLECYSTECTOMY  1990  . COLONOSCOPY W/ POLYPECTOMY    . COLONOSCOPY WITH PROPOFOL N/A 04/10/2014   Procedure: COLONOSCOPY WITH PROPOFOL;  Surgeon: Beryle Beams, MD;  Location: [facility] ENDOSCOPY;  Service: Endoscopy;  Laterality: N/A;  . DIAGNOSTIC LAPAROSCOPY     x2 bowel  obstructions(adhesions)  . 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 THYROIDECTOMY    . TUBAL LIGATION  1986    FAMHx:  Family History  Problem Relation Age of Onset  . Diabetes Mother   . Epilepsy Mother   . Cancer Mother     Breast  . Hypertension Mother   . Breast cancer Mother   . Kidney disease Father   . Diabetes Father   . Hypertension Father   . Asthma Father   . Heart disease Father   . Epilepsy Sister   . Cancer Maternal Grandmother   . Breast cancer Maternal Grandmother   . Cancer Paternal Grandmother   . Breast cancer Paternal Grandmother     SOCHx:   reports that she has never smoked. She has never used smokeless tobacco. She reports that she uses drugs, including Oxycodone. She reports that she does not drink alcohol.  ALLERGIES:  Allergies  Allergen Reactions  . Penicillins Other (See Comments)    intolerance  . Oxycodone-Acetaminophen Itching, Nausea And Vomiting and Rash    ROS: Pertinent items noted in HPI and remainder of comprehensive ROS otherwise negative.  HOME MEDS: Current Outpatient Prescriptions  Medication Sig Dispense Refill  . acetaminophen (TYLENOL) 650 MG CR tablet Take 650 mg by mouth every 6 (six) hours. 6A, 12P, 6P, 12A    . albuterol (PROAIR HFA) 108 (90 BASE) MCG/ACT inhaler Inhale 2 puffs into the lungs every 6 (six) hours as needed for wheezing or shortness of breath.     Marland Kitchen aspirin EC 81 MG EC tablet Take 1 tablet (81 mg total) by mouth daily.    . budesonide-formoterol (SYMBICORT) 160-4.5 MCG/ACT inhaler Inhale 2 puffs into the lungs 2 (two) times daily as needed (shortness of breath.).     Marland Kitchen calcium carbonate (TUMS EX) 750 MG chewable tablet Chew 1 tablet by mouth daily.    . carvedilol (COREG) 6.25 MG tablet Take 1 tablet (6.25 mg total) by mouth 2 (two) times daily with a meal.    . cyclobenzaprine (FLEXERIL) 10 MG tablet Take 1 tablet by mouth 3 (three) times daily.    . DULoxetine  (CYMBALTA) 60 MG capsule Take 1 capsule by mouth daily.    . eszopiclone (LUNESTA) 2 MG TABS tablet Take 2 mg by mouth at bedtime as needed for sleep. Take immediately before bedtime    . levalbuterol (XOPENEX) 0.63 MG/3ML nebulizer solution Take 3 mLs (0.63 mg total) by nebulization every 4 (four) hours as needed for wheezing or shortness of breath. 3 mL 12  . levothyroxine (SYNTHROID, LEVOTHROID) 300 MCG tablet Take 300 mcg by mouth daily before breakfast.    . magnesium hydroxide (MILK OF MAGNESIA) 400 MG/5ML suspension Take 30 mLs by mouth daily as needed for mild constipation. If no BM in 3 days    . mirabegron ER (MYRBETRIQ) 50 MG TB24 tablet Take 50 mg by mouth daily.    . OXYGEN Inhale 2 L/min into the lungs continuous.    . pantoprazole (PROTONIX) 40 MG tablet Take 1 tablet (40 mg total) by mouth at bedtime. 30 tablet 0  . potassium chloride SA (K-DUR,KLOR-CON) 20 MEQ tablet Take 1 tablet (20 mEq total) by mouth 2 (two) times daily.    . pregabalin (LYRICA) 200 MG capsule Take 200 mg by mouth 3 (three) times daily.    . Vitamin D, Ergocalciferol, (DRISDOL) 50000 units CAPS capsule Take 50,000 Units by mouth every 7 (seven) days.    . furosemide (LASIX) 40 MG tablet Take 1 tablet (40 mg total) by mouth daily. 90 tablet 1  . lisinopril (PRINIVIL,ZESTRIL) 2.5 MG tablet Take 1 tablet (2.5 mg total) by mouth daily. 90 tablet 3   No current facility-administered medications for this visit.     LABS/IMAGING: No results found for this or any previous visit (from the past 48 hour(s)). No results found.  VITALS: BP 124/77   Pulse 74   Ht 5\' 7"  (1.702 m)   SpO2 93%   EXAM: General appearance: alert and morbidly obese Neck: no carotid bruit and no JVD Lungs: diminished breath sounds bilaterally Heart: regular rate and rhythm Abdomen: soft, non-tender; bowel sounds normal; no masses,  no organomegaly Extremities: extremities normal, atraumatic, no cyanosis or edema Pulses: 2+ and  symmetric Skin: Skin color, texture, turgor normal. No rashes or lesions Neurologic: Grossly normal Psych: Pleasant  EKG: Deferred  ASSESSMENT: 1. Acute systolic congestive  heart failure-LVEF 25% 2. Super morbid obesity, with failure of 3 gastric bypass procedures 3. Pulmonary hypertension - PA pressure of 64 mmHg, normal LV systolic function 4. Progressive DOE 5. Hypertension-controlled  6. Possible A. fib/ectopic atrial tachycardia-resolved  PLAN: 1.   Ms. Traore has been hospitalized twice in 2018 for unintentional narcotic overdose and recently had associated aspiration, sepsis and was noted to develop new onset cardiomyopathy with EF 25%. I suspect this is nonischemic with global hypokinesis. She is on beta blocker, aspirin and diuretic. I think she benefit from the addition of low-dose ACE inhibitor. I would not recommend Entresto at this time due to cost issues and the fact that I expect her EF to normalize. We would likely repeat an echocardiogram in 3 months. I like to see her back afterwards. If standard therapy is not helpful then I would recommend weaning off the lisinopril and switching to [facility] if we could get it covered. Also she will need a repeat ischemic evaluation of her EF does not improve, likely in the form of cardiac catheterization. Finally, she is initially thought to have an atrial fibrillation however subsequently it was deemed to be more likely atrial tachycardia. This is ultimately resolved. I feel that was related to sepsis and critical illness and I would not recommend a change or addition of long-term anticoagulation at this time.  Follow-up with me in 3 months.  Pixie Casino, MD, [facility] Attending Cardiologist [place] Zaara Sprowl 06/06/2016, 5:43 PM

## 2016-06-09 ENCOUNTER — Other Ambulatory Visit: Payer: Self-pay | Admitting: Internal Medicine

## 2016-06-15 ENCOUNTER — Other Ambulatory Visit: Payer: Self-pay | Admitting: Internal Medicine

## 2016-06-15 ENCOUNTER — Other Ambulatory Visit: Payer: Self-pay | Admitting: Adult Health

## 2016-06-15 NOTE — Telephone Encounter (Signed)
furosemide (LASIX) 40 MG tablet 90 tablet 1 06/06/2016    Sig - Route: Take 1 tablet (40 mg total) by mouth daily. - Oral   E-Prescribing Status: Receipt confirmed by pharmacy (06/06/2016 4:12 PM EDT)   Pharmacy   [id number] - [place], [place]. AT [address]

## 2016-06-26 ENCOUNTER — Other Ambulatory Visit: Payer: Self-pay | Admitting: Internal Medicine

## 2016-07-07 ENCOUNTER — Observation Stay (HOSPITAL_COMMUNITY): Payer: Federal, State, Local not specified - PPO

## 2016-07-07 ENCOUNTER — Encounter (HOSPITAL_COMMUNITY): Payer: Self-pay | Admitting: Nurse Practitioner

## 2016-07-07 ENCOUNTER — Inpatient Hospital Stay (HOSPITAL_COMMUNITY)
Admission: EM | Admit: 2016-07-07 | Discharge: 2016-07-13 | DRG: 562 | Disposition: A | Discharge status: Home / Self Care | Payer: Federal, State, Local not specified - PPO | Attending: Internal Medicine | Admitting: Internal Medicine

## 2016-07-07 ENCOUNTER — Emergency Department (HOSPITAL_COMMUNITY): Payer: Federal, State, Local not specified - PPO

## 2016-07-07 DIAGNOSIS — J9602 Acute respiratory failure with hypercapnia: Secondary | ICD-10-CM | POA: Diagnosis present

## 2016-07-07 DIAGNOSIS — E875 Hyperkalemia: Secondary | ICD-10-CM | POA: Diagnosis not present

## 2016-07-07 DIAGNOSIS — J989 Respiratory disorder, unspecified: Secondary | ICD-10-CM | POA: Diagnosis not present

## 2016-07-07 DIAGNOSIS — S82002A Unspecified fracture of left patella, initial encounter for closed fracture: Secondary | ICD-10-CM | POA: Diagnosis not present

## 2016-07-07 DIAGNOSIS — I110 Hypertensive heart disease with heart failure: Secondary | ICD-10-CM | POA: Diagnosis present

## 2016-07-07 DIAGNOSIS — G894 Chronic pain syndrome: Secondary | ICD-10-CM | POA: Diagnosis not present

## 2016-07-07 DIAGNOSIS — S0990XA Unspecified injury of head, initial encounter: Secondary | ICD-10-CM | POA: Diagnosis not present

## 2016-07-07 DIAGNOSIS — E662 Morbid (severe) obesity with alveolar hypoventilation: Secondary | ICD-10-CM | POA: Diagnosis present

## 2016-07-07 DIAGNOSIS — J449 Chronic obstructive pulmonary disease, unspecified: Secondary | ICD-10-CM | POA: Diagnosis present

## 2016-07-07 DIAGNOSIS — E049 Nontoxic goiter, unspecified: Secondary | ICD-10-CM | POA: Diagnosis present

## 2016-07-07 DIAGNOSIS — T148XXA Other injury of unspecified body region, initial encounter: Secondary | ICD-10-CM

## 2016-07-07 DIAGNOSIS — G9340 Encephalopathy, unspecified: Secondary | ICD-10-CM | POA: Diagnosis present

## 2016-07-07 DIAGNOSIS — I5042 Chronic combined systolic (congestive) and diastolic (congestive) heart failure: Secondary | ICD-10-CM | POA: Diagnosis present

## 2016-07-07 DIAGNOSIS — N170 Acute kidney failure with tubular necrosis: Secondary | ICD-10-CM | POA: Diagnosis present

## 2016-07-07 DIAGNOSIS — R4182 Altered mental status, unspecified: Secondary | ICD-10-CM | POA: Diagnosis present

## 2016-07-07 DIAGNOSIS — W19XXXA Unspecified fall, initial encounter: Secondary | ICD-10-CM | POA: Diagnosis present

## 2016-07-07 DIAGNOSIS — M170 Bilateral primary osteoarthritis of knee: Secondary | ICD-10-CM | POA: Diagnosis present

## 2016-07-07 DIAGNOSIS — S82009A Unspecified fracture of unspecified patella, initial encounter for closed fracture: Secondary | ICD-10-CM

## 2016-07-07 DIAGNOSIS — Z01818 Encounter for other preprocedural examination: Secondary | ICD-10-CM

## 2016-07-07 DIAGNOSIS — Z880 Allergy status to penicillin: Secondary | ICD-10-CM

## 2016-07-07 DIAGNOSIS — Z9981 Dependence on supplemental oxygen: Secondary | ICD-10-CM

## 2016-07-07 DIAGNOSIS — J9600 Acute respiratory failure, unspecified whether with hypoxia or hypercapnia: Secondary | ICD-10-CM

## 2016-07-07 DIAGNOSIS — J45909 Unspecified asthma, uncomplicated: Secondary | ICD-10-CM | POA: Diagnosis present

## 2016-07-07 DIAGNOSIS — Y92009 Unspecified place in unspecified non-institutional (private) residence as the place of occurrence of the external cause: Secondary | ICD-10-CM | POA: Diagnosis not present

## 2016-07-07 DIAGNOSIS — E669 Obesity, unspecified: Secondary | ICD-10-CM | POA: Diagnosis present

## 2016-07-07 DIAGNOSIS — Z0189 Encounter for other specified special examinations: Secondary | ICD-10-CM

## 2016-07-07 DIAGNOSIS — I10 Essential (primary) hypertension: Secondary | ICD-10-CM | POA: Diagnosis present

## 2016-07-07 DIAGNOSIS — H538 Other visual disturbances: Secondary | ICD-10-CM

## 2016-07-07 DIAGNOSIS — M797 Fibromyalgia: Secondary | ICD-10-CM | POA: Diagnosis present

## 2016-07-07 DIAGNOSIS — Z6841 Body Mass Index (BMI) 40.0 and over, adult: Secondary | ICD-10-CM

## 2016-07-07 DIAGNOSIS — Z79899 Other long term (current) drug therapy: Secondary | ICD-10-CM

## 2016-07-07 DIAGNOSIS — R32 Unspecified urinary incontinence: Secondary | ICD-10-CM | POA: Diagnosis present

## 2016-07-07 DIAGNOSIS — I5043 Acute on chronic combined systolic (congestive) and diastolic (congestive) heart failure: Secondary | ICD-10-CM

## 2016-07-07 DIAGNOSIS — J984 Other disorders of lung: Secondary | ICD-10-CM | POA: Diagnosis present

## 2016-07-07 LAB — BASIC METABOLIC PANEL
ANION GAP: 7 (ref 5–15)
Anion gap: 6 (ref 5–15)
BUN: 36 mg/dL — ABNORMAL HIGH (ref 6–20)
BUN: 35 mg/dL — AB (ref 6–20)
CALCIUM: 7 mg/dL — AB (ref 8.9–10.3)
CHLORIDE: 110 mmol/L (ref 101–111)
CO2: 26 mmol/L (ref 22–32)
CO2: 25 mmol/L (ref 22–32)
CREATININE: 1.22 mg/dL — AB (ref 0.44–1.00)
Calcium: 6.9 mg/dL — ABNORMAL LOW (ref 8.9–10.3)
Chloride: 114 mmol/L — ABNORMAL HIGH (ref 101–111)
Creatinine, Ser: 1.11 mg/dL — ABNORMAL HIGH (ref 0.44–1.00)
GFR calc Af Amer: 53 mL/min — ABNORMAL LOW (ref >60)
GFR calc non Af Amer: 51 mL/min — ABNORMAL LOW (ref >60)
GFR, EST AFRICAN AMERICAN: 59 mL/min — AB (ref >60)
GFR, EST NON AFRICAN AMERICAN: 46 mL/min — AB (ref >60)
GLUCOSE: 52 mg/dL — AB (ref 65–99)
Glucose, Bld: 90 mg/dL (ref 65–99)
POTASSIUM: 6.7 mmol/L — AB (ref 3.5–5.1)
Potassium: 5.9 mmol/L — ABNORMAL HIGH (ref 3.5–5.1)
SODIUM: 143 mmol/L (ref 135–145)
SODIUM: 145 mmol/L (ref 135–145)

## 2016-07-07 LAB — CBC WITH DIFFERENTIAL/PLATELET
Basophils Absolute: 0 10*3/uL (ref 0.0–0.1)
Basophils Relative: 0 %
EOS ABS: 0.3 10*3/uL (ref 0.0–0.7)
Eosinophils Relative: 4 %
HCT: 37.1 % (ref 36.0–46.0)
HEMOGLOBIN: 11.6 g/dL — AB (ref 12.0–15.0)
LYMPHS PCT: 20 %
Lymphs Abs: 1.5 10*3/uL (ref 0.7–4.0)
MCH: 31.6 pg (ref 26.0–34.0)
MCHC: 31.3 g/dL (ref 30.0–36.0)
MCV: 101.1 fL — ABNORMAL HIGH (ref 78.0–100.0)
Monocytes Absolute: 0.7 10*3/uL (ref 0.1–1.0)
Monocytes Relative: 10 %
NEUTROS PCT: 66 %
Neutro Abs: 4.9 10*3/uL (ref 1.7–7.7)
PLATELETS: 172 10*3/uL (ref 150–400)
RBC: 3.67 MIL/uL — ABNORMAL LOW (ref 3.87–5.11)
RDW: 15.4 % (ref 11.5–15.5)
WBC: 7.4 10*3/uL (ref 4.0–10.5)

## 2016-07-07 LAB — GLUCOSE, CAPILLARY
Glucose-Capillary: 40 mg/dL — CL (ref 65–99)
Glucose-Capillary: 56 mg/dL — ABNORMAL LOW (ref 65–99)
Glucose-Capillary: 140 mg/dL — ABNORMAL HIGH (ref 65–99)
Glucose-Capillary: 227 mg/dL — ABNORMAL HIGH (ref 65–99)

## 2016-07-07 LAB — HEPATIC FUNCTION PANEL
ALT: 15 U/L (ref 14–54)
AST: 19 U/L (ref 15–41)
Albumin: 3.4 g/dL — ABNORMAL LOW (ref 3.5–5.0)
Alkaline Phosphatase: 84 U/L (ref 38–126)
BILIRUBIN DIRECT: 0.1 mg/dL (ref 0.1–0.5)
BILIRUBIN INDIRECT: 0.3 mg/dL (ref 0.3–0.9)
TOTAL PROTEIN: 6.8 g/dL (ref 6.5–8.1)
Total Bilirubin: 0.4 mg/dL (ref 0.3–1.2)

## 2016-07-07 LAB — CBG MONITORING, ED: GLUCOSE-CAPILLARY: 141 mg/dL — AB (ref 65–99)

## 2016-07-07 LAB — POTASSIUM: Potassium: 6.3 mmol/L (ref 3.5–5.1)

## 2016-07-07 MED ORDER — ZOLPIDEM TARTRATE 5 MG PO TABS: 5.0000 mg | ORAL_TABLET | Freq: Every day | ORAL | Status: DC

## 2016-07-07 MED ORDER — INSULIN ASPART 100 UNIT/ML IV SOLN
10.0000 [IU] | Freq: Once | INTRAVENOUS | Status: AC
  Administered 2016-07-07: 10 [IU] via INTRAVENOUS
  Filled 2016-07-07: qty 0.1

## 2016-07-07 MED ORDER — CARVEDILOL 6.25 MG PO TABS: 6.2500 mg | ORAL_TABLET | Freq: Two times a day (BID) | ORAL | Status: DC

## 2016-07-07 MED ORDER — LEVOTHYROXINE SODIUM 100 MCG PO TABS
300.0000 ug | ORAL_TABLET | Freq: Every day | ORAL | Status: DC
  Filled 2016-07-07: qty 2

## 2016-07-07 MED ORDER — MORPHINE SULFATE (PF) 4 MG/ML IV SOLN
4.0000 mg | Freq: Once | INTRAVENOUS | Status: AC
  Administered 2016-07-07: 4 mg via INTRAVENOUS
  Filled 2016-07-07: qty 1

## 2016-07-07 MED ORDER — SODIUM BICARBONATE 8.4 % IV SOLN
50.0000 meq | Freq: Once | INTRAVENOUS | Status: AC
  Administered 2016-07-07: 50 meq via INTRAVENOUS
  Filled 2016-07-07: qty 50

## 2016-07-07 MED ORDER — SODIUM CHLORIDE 0.9% FLUSH
3.0000 mL | Freq: Two times a day (BID) | INTRAVENOUS | Status: DC
  Administered 2016-07-08 – 2016-07-10 (×5): 3 mL via INTRAVENOUS
  Administered 2016-07-10: 21:00:00 via INTRAVENOUS
  Administered 2016-07-11 – 2016-07-12 (×3): 3 mL via INTRAVENOUS

## 2016-07-07 MED ORDER — NALOXONE HCL 2 MG/2ML IJ SOSY
0.5000 mg/h | PREFILLED_SYRINGE | INTRAVENOUS | Status: DC
  Filled 2016-07-07: qty 4

## 2016-07-07 MED ORDER — OXYCODONE-ACETAMINOPHEN 5-325 MG PO TABS
1.0000 | ORAL_TABLET | Freq: Once | ORAL | Status: AC
  Administered 2016-07-07: 1 via ORAL
  Filled 2016-07-07: qty 1

## 2016-07-07 MED ORDER — PANTOPRAZOLE SODIUM 40 MG PO TBEC: 40.0000 mg | DELAYED_RELEASE_TABLET | Freq: Every day | ORAL | Status: DC

## 2016-07-07 MED ORDER — SODIUM CHLORIDE 0.9 % IV SOLN
1.0000 g | Freq: Once | INTRAVENOUS | Status: AC
  Administered 2016-07-07: 1 g via INTRAVENOUS
  Filled 2016-07-07: qty 10

## 2016-07-07 MED ORDER — SODIUM CHLORIDE 0.9 % IV BOLUS (SEPSIS)
500.0000 mL | Freq: Once | INTRAVENOUS | Status: AC
  Administered 2016-07-07: 500 mL via INTRAVENOUS

## 2016-07-07 MED ORDER — MORPHINE SULFATE (PF) 4 MG/ML IV SOLN: 4.0000 mg | INTRAVENOUS | Status: DC | PRN

## 2016-07-07 MED ORDER — DEXTROSE 50 % IV SOLN
INTRAVENOUS | Status: AC
  Filled 2016-07-07: qty 50

## 2016-07-07 MED ORDER — DEXTROSE 50 % IV SOLN
25.0000 mL | Freq: Once | INTRAVENOUS | Status: AC
  Administered 2016-07-07: 25 mL via INTRAVENOUS

## 2016-07-07 MED ORDER — DULOXETINE HCL 30 MG PO CPEP: 60.0000 mg | ORAL_CAPSULE | Freq: Every day | ORAL | Status: DC

## 2016-07-07 MED ORDER — MIRABEGRON ER 25 MG PO TB24: 50.0000 mg | ORAL_TABLET | Freq: Every day | ORAL | Status: DC

## 2016-07-07 MED ORDER — CHLORHEXIDINE GLUCONATE 0.12 % MT SOLN
15.0000 mL | Freq: Two times a day (BID) | OROMUCOSAL | Status: DC
  Administered 2016-07-08: 15 mL via OROMUCOSAL

## 2016-07-07 MED ORDER — ORAL CARE MOUTH RINSE
15.0000 mL | Freq: Two times a day (BID) | OROMUCOSAL | Status: DC
  Administered 2016-07-08 (×2): 15 mL via OROMUCOSAL

## 2016-07-07 MED ORDER — ONDANSETRON HCL 4 MG PO TABS: 4.0000 mg | ORAL_TABLET | Freq: Four times a day (QID) | ORAL | Status: DC | PRN

## 2016-07-07 MED ORDER — HYDROCODONE-ACETAMINOPHEN 5-325 MG PO TABS: 1.0000 | ORAL_TABLET | ORAL | Status: DC | PRN

## 2016-07-07 MED ORDER — CYCLOBENZAPRINE HCL 10 MG PO TABS: 10.0000 mg | ORAL_TABLET | Freq: Three times a day (TID) | ORAL | Status: DC

## 2016-07-07 MED ORDER — HYDROCODONE-ACETAMINOPHEN 5-325 MG PO TABS
1.0000 | ORAL_TABLET | Freq: Once | ORAL | Status: AC
  Administered 2016-07-07: 1 via ORAL
  Filled 2016-07-07: qty 1

## 2016-07-07 MED ORDER — ENOXAPARIN SODIUM 80 MG/0.8ML ~~LOC~~ SOLN: 75.0000 mg | Freq: Every day | SUBCUTANEOUS | Status: DC

## 2016-07-07 MED ORDER — CALCIUM CARBONATE ANTACID 500 MG PO CHEW: 1.0000 | CHEWABLE_TABLET | Freq: Two times a day (BID) | ORAL | Status: DC

## 2016-07-07 MED ORDER — AMMONIA AROMATIC IN INHA
RESPIRATORY_TRACT | Status: AC
  Filled 2016-07-07: qty 10

## 2016-07-07 MED ORDER — ONDANSETRON HCL 4 MG/2ML IJ SOLN: 4.0000 mg | Freq: Four times a day (QID) | INTRAMUSCULAR | Status: DC | PRN

## 2016-07-07 MED ORDER — ASPIRIN 81 MG PO TBEC: 81.0000 mg | DELAYED_RELEASE_TABLET | Freq: Every day | ORAL | Status: DC

## 2016-07-07 MED ORDER — ONDANSETRON 8 MG PO TBDP
8.0000 mg | ORAL_TABLET | Freq: Once | ORAL | Status: AC
  Administered 2016-07-07: 8 mg via ORAL
  Filled 2016-07-07: qty 1

## 2016-07-07 MED ORDER — LEVALBUTEROL HCL 0.63 MG/3ML IN NEBU
0.6300 mg | INHALATION_SOLUTION | RESPIRATORY_TRACT | Status: DC | PRN
  Administered 2016-07-09 (×2): 0.63 mg via RESPIRATORY_TRACT
  Filled 2016-07-07 (×2): qty 3

## 2016-07-07 MED ORDER — DEXTROSE 50 % IV SOLN
1.0000 | Freq: Once | INTRAVENOUS | Status: AC
  Administered 2016-07-07: 50 mL via INTRAVENOUS

## 2016-07-07 MED ORDER — DEXTROSE 50 % IV SOLN
1.0000 | Freq: Once | INTRAVENOUS | Status: AC
  Administered 2016-07-07: 50 mL via INTRAVENOUS
  Filled 2016-07-07: qty 50

## 2016-07-07 MED ORDER — MAGNESIUM HYDROXIDE 400 MG/5ML PO SUSP
30.0000 mL | Freq: Every day | ORAL | Status: DC | PRN
Start: 2016-07-07 — End: 2016-07-08

## 2016-07-07 NOTE — ED Notes (Addendum)
Tried to get patient up and walk. Patient could not hardly stand. This was unsuccessful.

## 2016-07-07 NOTE — ED Triage Notes (Signed)
Patient fell getting into her car when her left knee gave out. Patient was on the way to a dr apppointment to see about a knee replacment for that knee. [address] to ground and hit left side of her face. Also has bilateral knee pain now.

## 2016-07-07 NOTE — Progress Notes (Signed)
Hypoglycemic Event  CBG: 40  Treatment: Soda  Symptoms: recheck after insulin   Follow-up CBG: Time:2215 CBG Result:56  Possible Reasons for Event: Insulin dose?  Comments/MD notified:N. Carter 2230  CBG: 56  Treatment: IV Dextrose   Symptoms: Lethargic   Follow-up CBG: Time:2300 CBG Result:227  Possible Reasons for Event: ?  Comments/MD notified:N. [place]

## 2016-07-07 NOTE — H&P (Signed)
History and Physical    Jillian Eaton:[id number] DOB: September 28, 1950 DOA: 07/07/2016  PCP: Red Christians, MD  Ortho: [facility] Pulm: Camillo Flaming [facility]) Cardiology: [facility]  Patient coming from: Home via EMS  Chief Complaint: Left knee pain after fall  HPI: Jillian Eaton is a 66 y.o. woman restrictive lung disease secondary to morbid obesity status post gastroplasty in 1985, asthma, chronic pain syndrome on chronic narcotic therapy (history of unintentional narcotic overdose), fibromyalgia, hypothyroidism, chronic combined CHF, chronic hypoxic respiratory failure with O2 dependence (on 2L [place] at baseline), and severe bilateral arthritis affecting both knees who feels that she was in her baseline state of health today when she had an accidental fall while trying to get into her car (she was actually headed to an ortho clinic appointment).  She says that it felt like her left leg "collapsed" underneath her and her knee "twisted" as she was going to the ground.  She ultimately fell forward, hitting her head on the concrete.  She denies syncope.  No reported stigmata of seizure activity.  Since the fall, she has had headache and blurred vision (reports difficulty reading, even when using her glasses).  She has 10 out of 10 knee pain.  No nausea or vomiting.  No chest pain or shortness of breath.  No abdominal pain.  She has increased soreness and muscle aches since the fall.  She has chronic back pain that she attributes to her history of fibromyalgia.    ED Course: The patient received oral hydrocodone 5 then oxycodone 5 in the ED.  Normal WBC count.  Hgb 11.6.  Potassium 6.7, repeat 6.3.  BUN 36.  Creatinine 1.11.  Calcium 6.9.  Albumin 3.4.  EKG shows NSR with more prominent peaked T waves when compared to prior EKGs.  Left knee xray shows nondisplaced patellar fracture with probable effusion.  Per ED attending, Dr. Wynelle Link has recommended knee immobilizer for now.  She has received NS 500  bolus, zofran, and pain medications as noted above.  Hospitalist asked to admit.  IV morphine ordered at time of admissiion.  Review of Systems: As per HPI otherwise 10 systems reviewed and negative.   Past Medical History:  Diagnosis Date  . Anemia   . Asthma   . Chronic headache   . DOE (dyspnea on exertion)    2D ECHO, 02/12/2012 - EF 60-65%, moderate concentric hypertrophy  . Fibromyalgia    nerve pain"left side at waist level" "can't lay on that side without pain" , "HOB elevation helps"  . Heart murmur   . Hematuria - cause not known   . History of kidney stones    x 2 '13, '14 surgery to remove  . Hypertension   . SBO (small bowel obstruction) ([place]) 06/07/2013  . SOB (shortness of breath)    NUCLEAR STRESS TEST, 03/02/2009 - no ischemic ST changes or symptoms  . Thyroid disease    "goiter"  . Transfusion history    10 yrs+  . Urinary incontinence, functional   Per cardiology documentation, atrial fibrillation ruled out.  Patient had an atrial tachycardia during her last admission.  She was not considered a candidate for long term anticoagulation.  Past Surgical History:  Procedure Laterality Date  . CARDIAC CATHETERIZATION  04/04/2010   No significant obstructive coronary artery disease  . CHOLECYSTECTOMY  1990  . COLONOSCOPY W/ POLYPECTOMY    . COLONOSCOPY WITH PROPOFOL N/A 04/10/2014   Procedure: COLONOSCOPY WITH PROPOFOL;  Surgeon: Beryle Beams,  MD;  Location: [facility] ENDOSCOPY;  Service: Endoscopy;  Laterality: N/A;  . DIAGNOSTIC LAPAROSCOPY     x2 bowel obstructions(adhesions)  . 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 THYROIDECTOMY    . TUBAL LIGATION  1986     reports that she has never smoked. She has never used smokeless tobacco. She reports that she uses drugs, including Oxycodone. She reports that she does not drink alcohol.  Long acting oxycodone stopped during her last admission.  Allergies  Allergen Reactions  .  Penicillins Other (See Comments)    intolerance  Patient actually denies any drug allergies to me (after being questioned multiple times).  It is noted that her PCN "allergy" is actually listed as an "intolerance".  I removed percocet from her allergy list; she has taken this medication in the ED tonight and denies allergy.  Family History  Problem Relation Age of Onset  . Diabetes Mother   . Epilepsy Mother   . Cancer Mother        Breast  . Hypertension Mother   . Breast cancer Mother   . Kidney disease Father   . Diabetes Father   . Hypertension Father   . Asthma Father   . Heart disease Father   . Epilepsy Sister   . Cancer Maternal Grandmother   . Breast cancer Maternal Grandmother   . Cancer Paternal Grandmother   . Breast cancer Paternal Grandmother      Prior to Admission medications   Medication Sig Start Date End Date Taking? Authorizing Provider  acetaminophen (TYLENOL) 650 MG CR tablet Take 650 mg by mouth every 6 (six) hours. 6A, 12P, 6P, 12A   Yes [provider]  albuterol (PROAIR HFA) 108 (90 BASE) MCG/ACT inhaler Inhale 2 puffs into the lungs every 6 (six) hours as needed for wheezing or shortness of breath.    Yes [provider]  aspirin EC 81 MG EC tablet Take 1 tablet (81 mg total) by mouth daily. 05/03/16  Yes Rama, Venetia Maxon, MD  budesonide-formoterol (SYMBICORT) 160-4.5 MCG/ACT inhaler Inhale 2 puffs into the lungs 2 (two) times daily as needed (shortness of breath.).    Yes Tanda Rockers, MD  calcium carbonate (TUMS EX) 750 MG chewable tablet Chew 1 tablet by mouth daily.   Yes [provider]  carvedilol (COREG) 6.25 MG tablet TAKE 1 TABLET BY MOUTH TWICE A DAY 06/09/16  Yes Hilty, Nadean Corwin, MD  cyclobenzaprine (FLEXERIL) 10 MG tablet Take 1 tablet by mouth 3 (three) times daily. 04/20/16  Yes [provider]  DULoxetine (CYMBALTA) 60 MG capsule Take 1 capsule by mouth daily. 05/25/16  Yes [provider]    eszopiclone (LUNESTA) 2 MG TABS tablet Take 2 mg by mouth at bedtime as needed for sleep. Take immediately before bedtime   Yes [provider]  furosemide (LASIX) 20 MG tablet TAKE 1 TABLET (20 MG TOTAL) BY MOUTH DAILY. 06/28/16  Yes Hilty, Nadean Corwin, MD  levalbuterol (XOPENEX) 0.63 MG/3ML nebulizer solution Take 3 mLs (0.63 mg total) by nebulization every 4 (four) hours as needed for wheezing or shortness of breath. 05/02/16  Yes Rama, Venetia Maxon, MD  levothyroxine (SYNTHROID, LEVOTHROID) 300 MCG tablet Take 300 mcg by mouth daily before breakfast.   Yes [provider]  lisinopril (PRINIVIL,ZESTRIL) 2.5 MG tablet Take 1 tablet (2.5 mg total) by mouth daily. 06/06/16 09/04/16 Yes Hilty, Nadean Corwin, MD  magnesium hydroxide (MILK  OF MAGNESIA) 400 MG/5ML suspension Take 30 mLs by mouth daily as needed for mild constipation. If no BM in 3 days   Yes [provider]  mirabegron ER (MYRBETRIQ) 50 MG TB24 tablet Take 50 mg by mouth daily.   Yes [provider]  OXYGEN Inhale 2 L/min into the lungs continuous.   Yes [provider]  pantoprazole (PROTONIX) 40 MG tablet Take 1 tablet (40 mg total) by mouth at bedtime. 03/12/16  Yes Lavina Hamman, MD  potassium chloride SA (K-DUR,KLOR-CON) 20 MEQ tablet Take 1 tablet (20 mEq total) by mouth 2 (two) times daily. 05/02/16  Yes Rama, Venetia Maxon, MD  Vitamin D, Ergocalciferol, (DRISDOL) 50000 units CAPS capsule Take 50,000 Units by mouth every 7 (seven) days.   Yes [provider]    Physical Exam: Vitals:   07/07/16 1500 07/07/16 2002 07/07/16 2005 07/07/16 2107  BP: 104/72 (!) 91/57 (!) 117/47 107/62  Pulse: 78 86 82 93  Resp: 16 20 20 12   Temp:  97.7 F (36.5 C) 98.2 F (36.8 C)   TempSrc:  Oral Oral   SpO2: 98% (!) 82% 95% 95%  Weight:      Height:          Constitutional: NAD, calm, comfortable Vitals:   07/07/16 1500 07/07/16 2002 07/07/16 2005 07/07/16 2107  BP: 104/72 (!) 91/57 (!)  117/47 107/62  Pulse: 78 86 82 93  Resp: 16 20 20 12   Temp:  97.7 F (36.5 C) 98.2 F (36.8 C)   TempSrc:  Oral Oral   SpO2: 98% (!) 82% 95% 95%  Weight:      Height:       Eyes: PERRL, lids and conjunctivae normal ENMT: Mucous membranes are moist. Posterior pharynx not completely visualized. Normal dentition.  Neck: normal appearance, thick but supple, no masses Respiratory: clear to auscultation bilaterally, no wheezing, no crackles. Normal respiratory effort. No accessory muscle use.  Cardiovascular: Normal rate, regular rhythm, no murmurs / rubs / gallops. No extremity edema. Diminished pulses bilaterally.  No carotid bruits.  GI: abdomen is obese but soft and compressible.  No distention.  No tenderness.  Bowel sounds are present. Musculoskeletal:  No joint deformity in upper and lower extremities. ROM reduced in her left leg; knee immobilizer in place; I did not remove.  No contractures. Normal muscle tone.  Skin: no rashes, warm and dry Neurologic: CN 2-12 grossly intact. Generalized weakness.  Psychiatric: Normal judgment and insight. Alert and oriented x 3. Normal mood.     Labs on Admission: I have personally reviewed following labs and imaging studies  CBC:  Recent Labs Lab 07/07/16 1732  WBC 7.4  NEUTROABS 4.9  HGB 11.6*  HCT 37.1  MCV 101.1*  PLT 683   Basic Metabolic Panel:  Recent Labs Lab 07/07/16 1732 07/07/16 1837  NA 143  --   K 6.7* 6.3*  CL 110  --   CO2 26  --   GLUCOSE 90  --   BUN 36*  --   CREATININE 1.11*  --   CALCIUM 6.9*  --    GFR: Estimated Creatinine Clearance: 73.9 mL/min (A) (by C-G formula based on SCr of 1.11 mg/dL (H)). Liver Function Tests:  Recent Labs Lab 07/07/16 1732  AST 19  ALT 15  ALKPHOS 84  BILITOT 0.4  PROT 6.8  ALBUMIN 3.4*   CBG:  Recent Labs Lab 07/07/16 2055  GLUCAP 141*    Radiological Exams on Admission: Dg Knee  Complete 4 Views Left  Result Date: 07/07/2016 CLINICAL DATA:  Acute left  knee pain following fall today. Initial encounter. EXAM: LEFT KNEE - COMPLETE 4+ VIEW COMPARISON:  06/10/2015 FINDINGS: Nondisplaced fractures of the upper and lower patella noted. A probable knee effusion is present. There is no evidence of dislocation. Moderate to severe tricompartmental degenerative changes are present. IMPRESSION: Nondisplaced patellar fractures.  Probable knee effusion. Moderate to severe tricompartmental degenerative changes. Electronically Signed   By: Margarette Canada M.D.   On: 07/07/2016 15:25    EKG: Independently reviewed. NSR.  No acute ST changes but I believe she has new peaked T waves compared to prior EKGs.  Assessment/Plan Principal Problem:   Hyperkalemia Active Problems:   Morbid obesity due to excess calories [facility])   Essential hypertension   Chronic pain syndrome   Asthma   Patellar fracture   Chronic combined systolic (congestive) and diastolic (congestive) heart failure ([place])   Head trauma   Fall at home, initial encounter      Acute knee pain S/P accidental fall, left patellar fracture --Trial of IV morphine for severe pain, lortab for moderate pain --Knee immobilizer per ortho recommendations --Orthopedic surgery will need to be called again in the AM for formal consultation --Will need PT and OT evaluations; patient concerned about safe ambulation in her home (she has a split level house with stairs).  Headache and vision disturbance S/P fall with head trauma --Will check head CT to rule out acute findings  Hyperkalemia with EKG changes --Admit to telemetry --IV calcium gluconate, IV insulin with one amp of D50 per protocol, one amp of bicarb --Repeat BMP at 10pm to look for trend before giving kayelate; BMP in the AM as well --Hold lasix, potassium, lisinopril for now  Hypocalcemia --IV calcium gluconate now, as noted above --increase oral calcium supplement to BID  Combined systolic and diastolic heart failure --Caution with  fluids --On Coreg  History of restrictive lung disease secondary to obesity --Hold long acting inhaler for now (OBS status) --Xopenex nebs prn  Hypothyroidism --Home dose of levothyroxine  Chronic pain --Cymbalta, flexeril --Hold scheduled acetaminophen for now since she has had narcotics        DVT prophylaxis: SCDs for now Code Status: FULL Family Communication: Daughter present at bedside in the ED at time of admission. Disposition Plan: To be determined. Consults called: Orthopedic surgery ([facility]) contacted by ED; he will need to be called again in the AM.  Patient has questions re: her acute injury. Admission status: Place in observation with telemetry monitoring.   TIME SPENT: 60 minutes   Eber Jones MD [facility] Pager [phone]  If 7PM-7AM, please contact night-coverage [email or web address] Password [facility]  07/07/2016, 9:19 PM

## 2016-07-07 NOTE — ED Notes (Signed)
Danett is patient daughter and would make decisions if need be at [phone]

## 2016-07-07 NOTE — ED Notes (Signed)
PA/RN notified

## 2016-07-07 NOTE — ED Provider Notes (Signed)
[facility] DEPT Provider Note   CSN: [id number] Arrival date & time: 07/07/16  1225     History   Chief Complaint No chief complaint on file.   HPI Jillian Eaton is a 66 y.o. female with multiple medical problems who presents with pain after a fall today. 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 She states she was getting in her car to go to a doctor's appointment for her knees when her left leg "gave out" and she fell. She states she hit the left side of her face on the ground and there was a "knot" there. She denies headache, neck pain, or LOC. She also states that when she fell her left leg was bent under her and she could not get it out. Her husband had to extend her knee out from under her. She has been unable to walk since then. She also has pain in the right knee but it is not as bad as the left. She can walk with a walker and uses wheelchair in her home.  HPI  Past Medical History:  Diagnosis Date  . Anemia   . Asthma   . Chronic headache   . DOE (dyspnea on exertion)    2D ECHO, 02/12/2012 - EF 60-65%, moderate concentric hypertrophy  . Fibromyalgia    nerve pain"left side at waist level" "can't lay on that side without pain" , "HOB elevation helps"  . Heart murmur   . Hematuria - cause not known   . History of kidney stones    x 2 '13, '14 surgery to remove  . Hypertension   . SBO (small bowel obstruction) ([place]) 06/07/2013  . SOB (shortness of breath)    NUCLEAR STRESS TEST, 03/02/2009 - no ischemic ST changes or symptoms  . Thyroid disease    "goiter"  . Transfusion history    10 yrs+  . Urinary incontinence, functional     Patient Active Problem List   Diagnosis Date Noted  . Sepsis ([place])   . Chronic pain syndrome   . Fibromyalgia   . Asthma   . Accidental drug overdose   . Hypokalemia   . Acute on chronic combined systolic and diastolic CHF (congestive heart failure) ([place])   . Dysphagia   .  Atrial fibrillation ([place]) 04/28/2016  . Elevated troponin 04/28/2016  . Aspiration pneumonia ([place]) 04/28/2016  . Vocal cord dysfunction 04/28/2016  . Non-STEMI (non-ST elevated myocardial infarction) ([place]) 04/28/2016  . Thrombocytopenia ([place]) 04/28/2016  . Seizures ([place])   . Encounter for central line placement   . Toxic metabolic encephalopathy 25/95/6387  . Pressure injury of skin 03/11/2016  . Altered mental status 03/10/2016  . Contusion of other part of head   . Opioid overdose   . Leg swelling 10/19/2015  . Acute on chronic respiratory failure with hypoxemia ([place]) 08/03/2015  . Bilateral leg edema 05/11/2015  . Essential hypertension 05/11/2015  . UTI (lower urinary tract infection) 06/07/2013  . Shortness of breath 01/08/2013  . Pulmonary hypertension ([place]) 01/08/2013  . Anemia of chronic disease 03/28/2011  . Asthma 09/22/2010  . Morbid obesity due to excess calories ([place]) 09/22/2010  . LOW BACK PAIN, ACUTE 04/11/2008  . TROCHANTERIC BURSITIS, LEFT 04/11/2008    Past Surgical History:  Procedure Laterality Date  . CARDIAC CATHETERIZATION  04/04/2010   No significant obstructive coronary artery disease  . CHOLECYSTECTOMY  1990  . COLONOSCOPY W/ POLYPECTOMY    . COLONOSCOPY WITH PROPOFOL N/A  04/10/2014   Procedure: COLONOSCOPY WITH PROPOFOL;  Surgeon: Beryle Beams, MD;  Location: [facility] ENDOSCOPY;  Service: Endoscopy;  Laterality: N/A;  . DIAGNOSTIC LAPAROSCOPY     x2 bowel obstructions(adhesions)  . 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 THYROIDECTOMY    . TUBAL LIGATION  1986    OB History    No data available       Home Medications    Prior to Admission medications   Medication Sig Start Date End Date Taking? Authorizing Provider  acetaminophen (TYLENOL) 650 MG CR tablet Take 650 mg by mouth every 6 (six) hours. 6A, 12P, 6P, 12A   Yes [provider]  albuterol (PROAIR HFA) 108 (90 BASE) MCG/ACT inhaler  Inhale 2 puffs into the lungs every 6 (six) hours as needed for wheezing or shortness of breath.    Yes [provider]  aspirin EC 81 MG EC tablet Take 1 tablet (81 mg total) by mouth daily. 05/03/16  Yes Rama, Venetia Maxon, MD  budesonide-formoterol (SYMBICORT) 160-4.5 MCG/ACT inhaler Inhale 2 puffs into the lungs 2 (two) times daily as needed (shortness of breath.).    Yes Tanda Rockers, MD  calcium carbonate (TUMS EX) 750 MG chewable tablet Chew 1 tablet by mouth daily.   Yes [provider]  carvedilol (COREG) 6.25 MG tablet TAKE 1 TABLET BY MOUTH TWICE A DAY 06/09/16  Yes Hilty, Nadean Corwin, MD  cyclobenzaprine (FLEXERIL) 10 MG tablet Take 1 tablet by mouth 3 (three) times daily. 04/20/16  Yes [provider]  DULoxetine (CYMBALTA) 60 MG capsule Take 1 capsule by mouth daily. 05/25/16  Yes [provider]  eszopiclone (LUNESTA) 2 MG TABS tablet Take 2 mg by mouth at bedtime as needed for sleep. Take immediately before bedtime   Yes [provider]  furosemide (LASIX) 20 MG tablet TAKE 1 TABLET (20 MG TOTAL) BY MOUTH DAILY. 06/28/16  Yes Hilty, Nadean Corwin, MD  levalbuterol (XOPENEX) 0.63 MG/3ML nebulizer solution Take 3 mLs (0.63 mg total) by nebulization every 4 (four) hours as needed for wheezing or shortness of breath. 05/02/16  Yes Rama, Venetia Maxon, MD  levothyroxine (SYNTHROID, LEVOTHROID) 300 MCG tablet Take 300 mcg by mouth daily before breakfast.   Yes [provider]  lisinopril (PRINIVIL,ZESTRIL) 2.5 MG tablet Take 1 tablet (2.5 mg total) by mouth daily. 06/06/16 09/04/16 Yes Hilty, Nadean Corwin, MD  magnesium hydroxide (MILK OF MAGNESIA) 400 MG/5ML suspension Take 30 mLs by mouth daily as needed for mild constipation. If no BM in 3 days   Yes [provider]  mirabegron ER (MYRBETRIQ) 50 MG TB24 tablet Take 50 mg by mouth daily.   Yes [provider]  OXYGEN Inhale 2 L/min into the lungs continuous.   Yes [provider]    pantoprazole (PROTONIX) 40 MG tablet Take 1 tablet (40 mg total) by mouth at bedtime. 03/12/16  Yes Lavina Hamman, MD  potassium chloride SA (K-DUR,KLOR-CON) 20 MEQ tablet Take 1 tablet (20 mEq total) by mouth 2 (two) times daily. 05/02/16  Yes Rama, Venetia Maxon, MD  Vitamin D, Ergocalciferol, (DRISDOL) 50000 units CAPS capsule Take 50,000 Units by mouth every 7 (seven) days.   Yes [provider]    Family History Family History  Problem Relation Age of Onset  . Diabetes Mother   . Epilepsy Mother   . Cancer Mother        Breast  .  Hypertension Mother   . Breast cancer Mother   . Kidney disease Father   . Diabetes Father   . Hypertension Father   . Asthma Father   . Heart disease Father   . Epilepsy Sister   . Cancer Maternal Grandmother   . Breast cancer Maternal Grandmother   . Cancer Paternal Grandmother   . Breast cancer Paternal Grandmother     Social History Social History  Substance Use Topics  . Smoking status: Never Smoker  . Smokeless tobacco: Never Used  . Alcohol use No     Comment: wine occ     Allergies   Penicillins and Oxycodone-acetaminophen   Review of Systems Review of Systems  HENT: Positive for facial swelling. Negative for dental problem and nosebleeds.   Musculoskeletal: Positive for arthralgias and gait problem. Negative for joint swelling and myalgias.  Skin: Negative for wound.  Neurological: Positive for weakness. Negative for syncope and numbness.  All other systems reviewed and are negative.    Physical Exam Updated Vital Signs BP 107/68   Pulse 74   Resp 20   Ht 5\' 7"  (1.702 m)   Wt 300 lb (136.1 kg)   SpO2 96%   BMI 46.99 kg/m   Physical Exam  Constitutional: She is oriented to person, place, and time. She appears well-developed and well-nourished. No distress.  Calm, cooperative. Morbidly obese  HENT:  Head: Normocephalic. Head is without raccoon's eyes, without Battle's sign, without contusion and without  laceration.  Nose: No sinus tenderness or nasal deformity.  No bruising or swelling noted over left cheek. Minimal tenderness over left cheekbone. No jaw malocclusion, jaw tenderness, or nasal tenderness  Eyes: Conjunctivae are normal. Pupils are equal, round, and reactive to light. Right eye exhibits no discharge. Left eye exhibits no discharge. No scleral icterus.  Neck: Normal range of motion.  Cardiovascular: Normal rate.   Pulmonary/Chest: Effort normal. No respiratory distress.  Abdominal: She exhibits no distension.  Musculoskeletal:  Difficult exam due to body habitus. Right knee is minimally tender and has decreased ROM. Left knee tender over anterior knee. Pt unable to range left knee actively. 2+ DP pulse  Neurological: She is alert and oriented to person, place, and time.  Skin: Skin is warm and dry.  Psychiatric: She has a normal mood and affect. Her behavior is normal.  Nursing note and vitals reviewed.    ED Treatments / Results  Labs (all labs ordered are listed, but only abnormal results are displayed) Labs Reviewed  BASIC METABOLIC PANEL - Abnormal; Notable for the following:       Result Value   Potassium 6.7 (*)    BUN 36 (*)    Creatinine, Ser 1.11 (*)    Calcium 6.9 (*)    GFR calc non Af Amer 51 (*)    GFR calc Af Amer 59 (*)    All other components within normal limits  CBC WITH DIFFERENTIAL/PLATELET - Abnormal; Notable for the following:    RBC 3.67 (*)    Hemoglobin 11.6 (*)    MCV 101.1 (*)    All other components within normal limits  POTASSIUM - Abnormal; Notable for the following:    Potassium 6.3 (*)    All other components within normal limits  HEPATIC FUNCTION PANEL    EKG  EKG Interpretation  Date/Time:  Friday Jul 07 2016 18:54:07 EDT Ventricular Rate:  97 PR Interval:    QRS Duration: 89 QT Interval:  349 QTC Calculation: 444 R  Axis:   81 Text Interpretation:  Sinus rhythm Borderline right axis deviation Confirmed by Hazle Coca  [phone]) on 07/07/2016 6:59:14 PM       Radiology Dg Knee Complete 4 Views Left  Result Date: 07/07/2016 CLINICAL DATA:  Acute left knee pain following fall today. Initial encounter. EXAM: LEFT KNEE - COMPLETE 4+ VIEW COMPARISON:  06/10/2015 FINDINGS: Nondisplaced fractures of the upper and lower patella noted. A probable knee effusion is present. There is no evidence of dislocation. Moderate to severe tricompartmental degenerative changes are present. IMPRESSION: Nondisplaced patellar fractures.  Probable knee effusion. Moderate to severe tricompartmental degenerative changes. Electronically Signed   By: Margarette Canada M.D.   On: 07/07/2016 15:25    Procedures Procedures (including critical care time)  Medications Ordered in ED Medications  HYDROcodone-acetaminophen (NORCO/VICODIN) 5-325 MG per tablet 1 tablet (1 tablet Oral Given 07/07/16 1348)  ondansetron (ZOFRAN-ODT) disintegrating tablet 8 mg (8 mg Oral Given 07/07/16 1348)  oxyCODONE-acetaminophen (PERCOCET/ROXICET) 5-325 MG per tablet 1 tablet (1 tablet Oral Given 07/07/16 1755)  sodium chloride 0.9 % bolus 500 mL (500 mLs Intravenous New Bag/Given 07/07/16 1952)    Initial Impression / Assessment and Plan / ED Course  I have reviewed the triage vital signs and the nursing notes.  Pertinent labs & imaging results that were available during my care of the patient were reviewed by me and considered in my medical decision making (see chart for details).  66 year old female presents with left knee pain and inability to range knee after a mechanical fall. Xray remarkable for upper and lower patellar fractures with joint effusion. She normally walks with a walker and uses a wheelchair in her home due to severe pain from arthritis.   Spoke with Dr. Maureen Ralphs who recommends orthotech wrap her leg with a bulky dressing and will place her in x-large knee immobilizer. He does not recommend further imaging at this time. Will give additional dose of pain  medicine and attempt to ambulate patient.   Attempted to ambulate patient. She was a 2 person assist and she was unable to put any weight on her left leg. Labs resulted and remarkable for hyperkalemia. She is on potassium supplements. Will defer to hospitalist regarding treatment. EKG does not show any significant change. Shared visit with Dr. Ralene Bathe. Will admit to hospitalist for observation.  Final Clinical Impressions(s) / ED Diagnoses   Final diagnoses:  Closed nondisplaced fracture of left patella, unspecified fracture morphology, initial encounter  Hyperkalemia    New Prescriptions New Prescriptions   No medications on file     Recardo Evangelist, PA-C 07/08/16 1012    Recardo Evangelist, PA-C 07/08/16 1013    Quintella Reichert, MD 07/12/16 [phone]

## 2016-07-07 NOTE — ED Notes (Signed)
Bed: [facility] Expected date:  Expected time:  Means of arrival:  Comments: EMS-fall 

## 2016-07-08 ENCOUNTER — Observation Stay (HOSPITAL_COMMUNITY): Payer: Federal, State, Local not specified - PPO

## 2016-07-08 ENCOUNTER — Observation Stay (HOSPITAL_BASED_OUTPATIENT_CLINIC_OR_DEPARTMENT_OTHER): Payer: Federal, State, Local not specified - PPO

## 2016-07-08 ENCOUNTER — Observation Stay (HOSPITAL_COMMUNITY): Payer: Federal, State, Local not specified - PPO | Admitting: Certified Registered"

## 2016-07-08 DIAGNOSIS — I5030 Unspecified diastolic (congestive) heart failure: Secondary | ICD-10-CM | POA: Diagnosis not present

## 2016-07-08 DIAGNOSIS — J984 Other disorders of lung: Secondary | ICD-10-CM | POA: Diagnosis present

## 2016-07-08 DIAGNOSIS — G9341 Metabolic encephalopathy: Secondary | ICD-10-CM

## 2016-07-08 DIAGNOSIS — G9340 Encephalopathy, unspecified: Secondary | ICD-10-CM | POA: Diagnosis present

## 2016-07-08 DIAGNOSIS — E049 Nontoxic goiter, unspecified: Secondary | ICD-10-CM | POA: Diagnosis present

## 2016-07-08 DIAGNOSIS — E875 Hyperkalemia: Secondary | ICD-10-CM

## 2016-07-08 DIAGNOSIS — R32 Unspecified urinary incontinence: Secondary | ICD-10-CM | POA: Diagnosis present

## 2016-07-08 DIAGNOSIS — J45909 Unspecified asthma, uncomplicated: Secondary | ICD-10-CM | POA: Diagnosis present

## 2016-07-08 DIAGNOSIS — T148XXA Other injury of unspecified body region, initial encounter: Secondary | ICD-10-CM | POA: Diagnosis not present

## 2016-07-08 DIAGNOSIS — J9602 Acute respiratory failure with hypercapnia: Secondary | ICD-10-CM | POA: Diagnosis present

## 2016-07-08 DIAGNOSIS — W19XXXA Unspecified fall, initial encounter: Secondary | ICD-10-CM | POA: Diagnosis present

## 2016-07-08 DIAGNOSIS — T40601A Poisoning by unspecified narcotics, accidental (unintentional), initial encounter: Secondary | ICD-10-CM

## 2016-07-08 DIAGNOSIS — G894 Chronic pain syndrome: Secondary | ICD-10-CM | POA: Diagnosis present

## 2016-07-08 DIAGNOSIS — M170 Bilateral primary osteoarthritis of knee: Secondary | ICD-10-CM | POA: Diagnosis present

## 2016-07-08 DIAGNOSIS — J9601 Acute respiratory failure with hypoxia: Secondary | ICD-10-CM | POA: Diagnosis not present

## 2016-07-08 DIAGNOSIS — Z6841 Body Mass Index (BMI) 40.0 and over, adult: Secondary | ICD-10-CM | POA: Diagnosis not present

## 2016-07-08 DIAGNOSIS — M797 Fibromyalgia: Secondary | ICD-10-CM | POA: Diagnosis present

## 2016-07-08 DIAGNOSIS — Z79899 Other long term (current) drug therapy: Secondary | ICD-10-CM | POA: Diagnosis not present

## 2016-07-08 DIAGNOSIS — I5042 Chronic combined systolic (congestive) and diastolic (congestive) heart failure: Secondary | ICD-10-CM | POA: Diagnosis present

## 2016-07-08 DIAGNOSIS — Z9981 Dependence on supplemental oxygen: Secondary | ICD-10-CM | POA: Diagnosis not present

## 2016-07-08 DIAGNOSIS — N170 Acute kidney failure with tubular necrosis: Secondary | ICD-10-CM | POA: Diagnosis present

## 2016-07-08 DIAGNOSIS — S82009D Unspecified fracture of unspecified patella, subsequent encounter for closed fracture with routine healing: Secondary | ICD-10-CM | POA: Diagnosis not present

## 2016-07-08 DIAGNOSIS — E662 Morbid (severe) obesity with alveolar hypoventilation: Secondary | ICD-10-CM | POA: Diagnosis present

## 2016-07-08 DIAGNOSIS — S0990XA Unspecified injury of head, initial encounter: Secondary | ICD-10-CM | POA: Diagnosis present

## 2016-07-08 DIAGNOSIS — S82002A Unspecified fracture of left patella, initial encounter for closed fracture: Secondary | ICD-10-CM | POA: Diagnosis present

## 2016-07-08 DIAGNOSIS — I10 Essential (primary) hypertension: Secondary | ICD-10-CM | POA: Diagnosis not present

## 2016-07-08 DIAGNOSIS — I110 Hypertensive heart disease with heart failure: Secondary | ICD-10-CM | POA: Diagnosis present

## 2016-07-08 DIAGNOSIS — J989 Respiratory disorder, unspecified: Secondary | ICD-10-CM | POA: Diagnosis not present

## 2016-07-08 DIAGNOSIS — R4182 Altered mental status, unspecified: Secondary | ICD-10-CM | POA: Diagnosis not present

## 2016-07-08 DIAGNOSIS — Z880 Allergy status to penicillin: Secondary | ICD-10-CM | POA: Diagnosis not present

## 2016-07-08 LAB — BLOOD GAS, ARTERIAL
ACID-BASE DEFICIT: 2.8 mmol/L — AB (ref 0.0–2.0)
ACID-BASE EXCESS: 5 mmol/L — AB (ref 0.0–2.0)
Acid-Base Excess: 1.1 mmol/L (ref 0.0–2.0)
Acid-base deficit: 4.5 mmol/L — ABNORMAL HIGH (ref 0.0–2.0)
Acid-base deficit: 5.6 mmol/L — ABNORMAL HIGH (ref 0.0–2.0)
BICARBONATE: 26 mmol/L (ref 20.0–28.0)
BICARBONATE: 26.2 mmol/L (ref 20.0–28.0)
BICARBONATE: 26.8 mmol/L (ref 20.0–28.0)
BICARBONATE: 27.4 mmol/L (ref 20.0–28.0)
Bicarbonate: 30.2 mmol/L — ABNORMAL HIGH (ref 20.0–28.0)
DELIVERY SYSTEMS: POSITIVE
DRAWN BY: 225631
DRAWN BY: 422461
DRAWN BY: 422461
Drawn by: 112491
Drawn by: 295031
Expiratory PAP: 6
FIO2: 40
FIO2: 40
FIO2: 40
FIO2: 40
INSPIRATORY PAP: 20
LHR: 20 {breaths}/min
MECHVT: 0.49 mL
Mechanical Rate: 20
Mode: POSITIVE
O2 CONTENT: 4 L/min
O2 SAT: 90.6 %
O2 SAT: 93.1 %
O2 SAT: 95.1 %
O2 Saturation: 90.5 %
O2 Saturation: 93.6 %
PATIENT TEMPERATURE: 97.6
PATIENT TEMPERATURE: 97.6
PATIENT TEMPERATURE: 98.6
PCO2 ART: 95.6 mmHg — AB (ref 32.0–48.0)
PCO2 ART: 55.4 mmHg — AB (ref 32.0–48.0)
PEEP/CPAP: 5 cmH2O
PEEP: 5 cmH2O
PEEP: 5 cmH2O
PO2 ART: 74.3 mmHg — AB (ref 83.0–108.0)
PO2 ART: 76.4 mmHg — AB (ref 83.0–108.0)
PO2 ART: 70 mmHg — AB (ref 83.0–108.0)
Patient temperature: 97.6
Patient temperature: 99.7
RATE: 20 resp/min
RATE: 18 resp/min
RATE: 18 resp/min
VT: 490 mL
VT: 490 mL
pCO2 arterial: 51.9 mmHg — ABNORMAL HIGH (ref 32.0–48.0)
pCO2 arterial: 68.2 mmHg (ref 32.0–48.0)
pCO2 arterial: 84.5 mmHg (ref 32.0–48.0)
pH, Arterial: 7.071 — CL (ref 7.350–7.450)
pH, Arterial: 7.114 — CL (ref 7.350–7.450)
pH, Arterial: 7.202 — ABNORMAL LOW (ref 7.350–7.450)
pH, Arterial: 7.315 — ABNORMAL LOW (ref 7.350–7.450)
pH, Arterial: 7.386 (ref 7.350–7.450)
pO2, Arterial: 84.1 mmHg (ref 83.0–108.0)
pO2, Arterial: 84.5 mmHg (ref 83.0–108.0)

## 2016-07-08 LAB — BASIC METABOLIC PANEL
Anion gap: 6 (ref 5–15)
Anion gap: 5 (ref 5–15)
BUN: 36 mg/dL — ABNORMAL HIGH (ref 6–20)
BUN: 38 mg/dL — AB (ref 6–20)
CALCIUM: 6.9 mg/dL — AB (ref 8.9–10.3)
CHLORIDE: 114 mmol/L — AB (ref 101–111)
CO2: 26 mmol/L (ref 22–32)
CO2: 29 mmol/L (ref 22–32)
CREATININE: 1.4 mg/dL — AB (ref 0.44–1.00)
CREATININE: 1.38 mg/dL — AB (ref 0.44–1.00)
Calcium: 6.9 mg/dL — ABNORMAL LOW (ref 8.9–10.3)
Chloride: 112 mmol/L — ABNORMAL HIGH (ref 101–111)
GFR calc non Af Amer: 39 mL/min — ABNORMAL LOW (ref >60)
GFR calc non Af Amer: 39 mL/min — ABNORMAL LOW (ref >60)
GFR, EST AFRICAN AMERICAN: 45 mL/min — AB (ref >60)
GFR, EST AFRICAN AMERICAN: 46 mL/min — AB (ref >60)
GLUCOSE: 147 mg/dL — AB (ref 65–99)
Glucose, Bld: 100 mg/dL — ABNORMAL HIGH (ref 65–99)
Potassium: 6.4 mmol/L (ref 3.5–5.1)
Potassium: 5.8 mmol/L — ABNORMAL HIGH (ref 3.5–5.1)
SODIUM: 148 mmol/L — AB (ref 135–145)
Sodium: 144 mmol/L (ref 135–145)

## 2016-07-08 LAB — GLUCOSE, CAPILLARY
GLUCOSE-CAPILLARY: 105 mg/dL — AB (ref 65–99)
GLUCOSE-CAPILLARY: 108 mg/dL — AB (ref 65–99)
GLUCOSE-CAPILLARY: 110 mg/dL — AB (ref 65–99)
GLUCOSE-CAPILLARY: 110 mg/dL — AB (ref 65–99)
GLUCOSE-CAPILLARY: 129 mg/dL — AB (ref 65–99)
Glucose-Capillary: 90 mg/dL (ref 65–99)
Glucose-Capillary: 107 mg/dL — ABNORMAL HIGH (ref 65–99)
Glucose-Capillary: 108 mg/dL — ABNORMAL HIGH (ref 65–99)
Glucose-Capillary: 125 mg/dL — ABNORMAL HIGH (ref 65–99)

## 2016-07-08 LAB — CBC
HCT: 38.5 % (ref 36.0–46.0)
Hemoglobin: 11.7 g/dL — ABNORMAL LOW (ref 12.0–15.0)
MCH: 31.8 pg (ref 26.0–34.0)
MCHC: 30.4 g/dL (ref 30.0–36.0)
MCV: 104.6 fL — ABNORMAL HIGH (ref 78.0–100.0)
PLATELETS: 165 10*3/uL (ref 150–400)
RBC: 3.68 MIL/uL — ABNORMAL LOW (ref 3.87–5.11)
RDW: 15.7 % — ABNORMAL HIGH (ref 11.5–15.5)
WBC: 6.5 10*3/uL (ref 4.0–10.5)

## 2016-07-08 LAB — ECHOCARDIOGRAM COMPLETE
AOASC: 30 cm
CHL CUP MV DEC (S): 151
CHL CUP STROKE VOLUME: 40 mL
EERAT: 6.68
EWDT: 151 ms
FS: 28 % (ref 28–44)
HEIGHTINCHES: 67 in
IV/PV OW: 0.65
LA diam index: 0.91 cm/m2
LA vol A4C: 53.8 ml
LA vol index: 18.7 mL/m2
LA vol: 51.4 mL
LASIZE: 25 mm
LEFT ATRIUM END SYS DIAM: 25 mm
LV PW d: 16.1 mm — AB (ref 0.6–1.1)
LV TDI E'LATERAL: 7.62
LV TDI E'MEDIAL: 5.22
LV dias vol index: 23 mL/m2
LV sys vol index: 8 mL/m2
LV sys vol: 23 mL (ref 14–42)
LVDIAVOL: 64 mL (ref 46–106)
LVEEAVG: 6.68
LVEEMED: 6.68
LVELAT: 7.62 cm/s
LVOT VTI: 19.6 cm
LVOT area: 3.14 cm2
LVOT diameter: 20 mm
LVOTPV: 104 cm/s
LVOTSV: 62 mL
Lateral S' vel: 11.6 cm/s
MVPKAVEL: 67.9 m/s
MVPKEVEL: 50.9 m/s
Simpson's disk: 63
TAPSE: 10.7 mm
Weight: 5292.8 oz

## 2016-07-08 LAB — PHOSPHORUS
Phosphorus: 8 mg/dL — ABNORMAL HIGH (ref 2.5–4.6)
Phosphorus: 6.6 mg/dL — ABNORMAL HIGH (ref 2.5–4.6)
Phosphorus: 5.2 mg/dL — ABNORMAL HIGH (ref 2.5–4.6)

## 2016-07-08 LAB — MAGNESIUM
MAGNESIUM: 1.6 mg/dL — AB (ref 1.7–2.4)
MAGNESIUM: 2 mg/dL (ref 1.7–2.4)
MAGNESIUM: 1.9 mg/dL (ref 1.7–2.4)

## 2016-07-08 LAB — MRSA PCR SCREENING: MRSA BY PCR: NEGATIVE

## 2016-07-08 LAB — TROPONIN I: TROPONIN I: 0.03 ng/mL — AB (ref <0.03)

## 2016-07-08 MED ORDER — VITAL HIGH PROTEIN PO LIQD
1000.0000 mL | ORAL | Status: DC
  Administered 2016-07-08: 20:00:00
  Administered 2016-07-08: 1000 mL
  Administered 2016-07-08 (×4)
  Administered 2016-07-08: 1000 mL
  Administered 2016-07-09 (×7)
  Filled 2016-07-08 (×2): qty 1000

## 2016-07-08 MED ORDER — PROPOFOL 10 MG/ML IV BOLUS
INTRAVENOUS | Status: DC | PRN
  Administered 2016-07-08: 50 mg via INTRAVENOUS

## 2016-07-08 MED ORDER — SUCCINYLCHOLINE CHLORIDE 20 MG/ML IJ SOLN
INTRAMUSCULAR | Status: DC | PRN
Start: 2016-07-08 — End: 2016-07-08
  Administered 2016-07-08: 100 mg via INTRAVENOUS

## 2016-07-08 MED ORDER — SODIUM CHLORIDE 0.9 % IV SOLN
250.0000 mL | INTRAVENOUS | Status: DC | PRN
  Administered 2016-07-08: 10 mL via INTRAVENOUS

## 2016-07-08 MED ORDER — SUCCINYLCHOLINE CHLORIDE 200 MG/10ML IV SOSY
PREFILLED_SYRINGE | INTRAVENOUS | Status: AC
  Filled 2016-07-08: qty 10

## 2016-07-08 MED ORDER — LEVOTHYROXINE SODIUM 100 MCG IV SOLR
150.0000 ug | Freq: Every day | INTRAVENOUS | Status: DC
  Administered 2016-07-08: 150 ug via INTRAVENOUS
  Filled 2016-07-08: qty 10

## 2016-07-08 MED ORDER — NALOXONE HCL 0.4 MG/ML IJ SOLN
0.4000 mg | Freq: Once | INTRAMUSCULAR | Status: AC
  Administered 2016-07-07: 0.4 mg via INTRAVENOUS

## 2016-07-08 MED ORDER — HEPARIN SODIUM (PORCINE) 5000 UNIT/ML IJ SOLN
5000.0000 [IU] | Freq: Three times a day (TID) | INTRAMUSCULAR | Status: DC
  Administered 2016-07-08 – 2016-07-13 (×15): 5000 [IU] via SUBCUTANEOUS
  Filled 2016-07-08 (×15): qty 1

## 2016-07-08 MED ORDER — ORAL CARE MOUTH RINSE
15.0000 mL | Freq: Four times a day (QID) | OROMUCOSAL | Status: DC
  Administered 2016-07-08 – 2016-07-09 (×3): 15 mL via OROMUCOSAL

## 2016-07-08 MED ORDER — SODIUM BICARBONATE 8.4 % IV SOLN
INTRAVENOUS | Status: DC
  Administered 2016-07-08 (×2): via INTRAVENOUS
  Filled 2016-07-08 (×3): qty 150

## 2016-07-08 MED ORDER — MIDAZOLAM HCL 2 MG/2ML IJ SOLN
2.0000 mg | INTRAMUSCULAR | Status: DC | PRN
  Administered 2016-07-08: 2 mg via INTRAVENOUS

## 2016-07-08 MED ORDER — FENTANYL CITRATE (PF) 100 MCG/2ML IJ SOLN
12.5000 ug | Freq: Once | INTRAMUSCULAR | Status: AC
  Administered 2016-07-08: 12.5 ug via INTRAVENOUS
  Filled 2016-07-08: qty 2

## 2016-07-08 MED ORDER — SODIUM CHLORIDE 0.45 % IV SOLN
INTRAVENOUS | Status: DC
  Administered 2016-07-08 – 2016-07-09 (×2): via INTRAVENOUS

## 2016-07-08 MED ORDER — FENTANYL CITRATE (PF) 2500 MCG/50ML IJ SOLN
25.0000 ug/h | INTRAMUSCULAR | Status: DC
  Administered 2016-07-08: 50 ug/h via INTRAVENOUS
  Filled 2016-07-08: qty 50

## 2016-07-08 MED ORDER — DEXTROSE-NACL 5-0.45 % IV SOLN
INTRAVENOUS | Status: DC
  Administered 2016-07-07: 125 mL via INTRAVENOUS

## 2016-07-08 MED ORDER — FENTANYL CITRATE (PF) 100 MCG/2ML IJ SOLN
50.0000 ug | Freq: Once | INTRAMUSCULAR | Status: AC
  Administered 2016-07-08: 50 ug via INTRAVENOUS

## 2016-07-08 MED ORDER — NALOXONE HCL 0.4 MG/ML IJ SOLN
0.4000 mg | Freq: Once | INTRAMUSCULAR | Status: AC
Start: 2016-07-08 — End: 2016-07-07
  Administered 2016-07-07: 0.4 mg via INTRAVENOUS

## 2016-07-08 MED ORDER — PANTOPRAZOLE SODIUM 40 MG IV SOLR
40.0000 mg | Freq: Every day | INTRAVENOUS | Status: DC
  Administered 2016-07-08: 40 mg via INTRAVENOUS
  Filled 2016-07-08: qty 40

## 2016-07-08 MED ORDER — INSULIN ASPART 100 UNIT/ML ~~LOC~~ SOLN
0.0000 [IU] | SUBCUTANEOUS | Status: DC
  Administered 2016-07-08: 1 [IU] via SUBCUTANEOUS

## 2016-07-08 MED ORDER — PROPOFOL 10 MG/ML IV BOLUS
INTRAVENOUS | Status: AC
  Filled 2016-07-08: qty 20

## 2016-07-08 MED ORDER — SODIUM POLYSTYRENE SULFONATE 15 GM/60ML PO SUSP
60.0000 g | Freq: Once | ORAL | Status: AC
  Administered 2016-07-08: 60 g
  Filled 2016-07-08: qty 240

## 2016-07-08 MED ORDER — PRO-STAT SUGAR FREE PO LIQD
30.0000 mL | Freq: Two times a day (BID) | ORAL | Status: DC
  Administered 2016-07-08: 30 mL
  Filled 2016-07-08 (×2): qty 30

## 2016-07-08 MED ORDER — CHLORHEXIDINE GLUCONATE 0.12% ORAL RINSE (MEDLINE KIT)
15.0000 mL | Freq: Two times a day (BID) | OROMUCOSAL | Status: DC
  Administered 2016-07-08 – 2016-07-09 (×3): 15 mL via OROMUCOSAL

## 2016-07-08 MED ORDER — DICLOFENAC SODIUM 1 % TD GEL
2.0000 g | Freq: Four times a day (QID) | TRANSDERMAL | Status: DC
  Administered 2016-07-08: 2 g via TOPICAL
  Filled 2016-07-08: qty 100

## 2016-07-08 MED ORDER — MIDAZOLAM HCL 2 MG/2ML IJ SOLN
2.0000 mg | INTRAMUSCULAR | Status: DC | PRN
  Filled 2016-07-08: qty 2

## 2016-07-08 MED ORDER — MAGNESIUM SULFATE 2 GM/50ML IV SOLN
2.0000 g | Freq: Once | INTRAVENOUS | Status: AC
  Administered 2016-07-08: 2 g via INTRAVENOUS
  Filled 2016-07-08: qty 50

## 2016-07-08 MED ORDER — SODIUM CHLORIDE 0.9 % IV BOLUS (SEPSIS)
500.0000 mL | Freq: Once | INTRAVENOUS | Status: AC
  Administered 2016-07-08: 500 mL via INTRAVENOUS

## 2016-07-08 MED ORDER — FENTANYL BOLUS VIA INFUSION
50.0000 ug | INTRAVENOUS | Status: DC | PRN
  Filled 2016-07-08: qty 50

## 2016-07-08 MED ORDER — DICLOFENAC SODIUM 1 % TD GEL
2.0000 g | Freq: Four times a day (QID) | TRANSDERMAL | Status: DC
  Administered 2016-07-08 – 2016-07-12 (×14): 2 g via TOPICAL

## 2016-07-08 NOTE — Consult Note (Addendum)
Name: Jillian Eaton MRN: [id number] DOB: [birth year]    ADMISSION DATE:  07/07/2016 CONSULTATION DATE:  07/08/2016  REFERRING MD :  Dr. Eulas Post   CHIEF COMPLAINT:  AMS   HISTORY OF PRESENT ILLNESS:   66 year old female with PMH of Anemia, Asthma, Diastolic and Systolic HF (EF 08-65), Chronic Headache, HTN, Small bowel obstruction, Thyroid disease, pulmonary HTN, and restrictive lung disease secondary to morbid obesity (wears 2L  at HS).   On 5/19 patient presented to ED with bilateral knee pain (left greater than right). Patient was at PCP today and after appointment was getting back in car when left leg gave out, she reports falling to ground and hitting her head. Xray revealed left nondisplaced patellar fracture. Patient given Percocet at 1755 and 4 mg morphine at 2106. At 2307 patient was found to have decreased level of consciousness. Responded to PRN Narcan. 4 doses given. ABG 7.11/84.5/84.1. Patient placed on Bipap. PCCM asked to consult.   Recent admission 3/4-3/13 after being found unresponsive with questionable aspiration event, during this admission new found severe systolic HF.    SIGNIFICANT EVENTS  5/18 > Presents s/p fall   STUDIES:  Left Knee Xray 5/18 > nondisplaced patellar fractures, probable knee effusion, moderate to severe tricompartmental degenerative changes  CT Head 5/18 > No acute  PAST MEDICAL HISTORY :   has a past medical history of Anemia; Asthma; Chronic headache; DOE (dyspnea on exertion); Fibromyalgia; Heart murmur; Hematuria - cause not known; History of kidney stones; Hypertension; SBO (small bowel obstruction) ([place]) (06/07/2013); SOB (shortness of breath); Thyroid disease; Transfusion history; and Urinary incontinence, functional.  has a past surgical history that includes Cholecystectomy (1990); Gastroplasty (1985); Tubal ligation (1986); Cardiac catheterization (04/04/2010); Thyroidectomy; Diagnostic laparoscopy; Colonoscopy w/ polypectomy; and  Colonoscopy with propofol (N/A, 04/10/2014). Prior to Admission medications   Medication Sig Start Date End Date Taking? Authorizing Provider  acetaminophen (TYLENOL) 650 MG CR tablet Take 650 mg by mouth every 6 (six) hours. 6A, 12P, 6P, 12A   Yes [provider]  albuterol (PROAIR HFA) 108 (90 BASE) MCG/ACT inhaler Inhale 2 puffs into the lungs every 6 (six) hours as needed for wheezing or shortness of breath.    Yes [provider]  aspirin EC 81 MG EC tablet Take 1 tablet (81 mg total) by mouth daily. 05/03/16  Yes Rama, Venetia Maxon, MD  budesonide-formoterol (SYMBICORT) 160-4.5 MCG/ACT inhaler Inhale 2 puffs into the lungs 2 (two) times daily as needed (shortness of breath.).    Yes Tanda Rockers, MD  calcium carbonate (TUMS EX) 750 MG chewable tablet Chew 1 tablet by mouth daily.   Yes [provider]  carvedilol (COREG) 6.25 MG tablet TAKE 1 TABLET BY MOUTH TWICE A DAY 06/09/16  Yes Hilty, Nadean Corwin, MD  cyclobenzaprine (FLEXERIL) 10 MG tablet Take 1 tablet by mouth 3 (three) times daily. 04/20/16  Yes [provider]  DULoxetine (CYMBALTA) 60 MG capsule Take 1 capsule by mouth daily. 05/25/16  Yes [provider]  eszopiclone (LUNESTA) 2 MG TABS tablet Take 2 mg by mouth at bedtime as needed for sleep. Take immediately before bedtime   Yes [provider]  furosemide (LASIX) 20 MG tablet TAKE 1 TABLET (20 MG TOTAL) BY MOUTH DAILY. 06/28/16  Yes Hilty, Nadean Corwin, MD  levalbuterol (XOPENEX) 0.63 MG/3ML nebulizer solution Take 3 mLs (0.63 mg total) by nebulization every 4 (four) hours as needed for wheezing or shortness of breath. 05/02/16  Yes Rama, Margreta Journey  P, MD  levothyroxine (SYNTHROID, LEVOTHROID) 300 MCG tablet Take 300 mcg by mouth daily before breakfast.   Yes [provider]  lisinopril (PRINIVIL,ZESTRIL) 2.5 MG tablet Take 1 tablet (2.5 mg total) by mouth daily. 06/06/16 09/04/16 Yes Hilty, Nadean Corwin, MD  magnesium hydroxide (MILK  OF MAGNESIA) 400 MG/5ML suspension Take 30 mLs by mouth daily as needed for mild constipation. If no BM in 3 days   Yes [provider]  mirabegron ER (MYRBETRIQ) 50 MG TB24 tablet Take 50 mg by mouth daily.   Yes [provider]  OXYGEN Inhale 2 L/min into the lungs continuous.   Yes [provider]  pantoprazole (PROTONIX) 40 MG tablet Take 1 tablet (40 mg total) by mouth at bedtime. 03/12/16  Yes Lavina Hamman, MD  potassium chloride SA (K-DUR,KLOR-CON) 20 MEQ tablet Take 1 tablet (20 mEq total) by mouth 2 (two) times daily. 05/02/16  Yes Rama, Venetia Maxon, MD  Vitamin D, Ergocalciferol, (DRISDOL) 50000 units CAPS capsule Take 50,000 Units by mouth every 7 (seven) days.   Yes [provider]   Allergies  Allergen Reactions  . Morphine And Related Other (See Comments)    Respiratory suppression, respiratory failure when given IV  . Penicillins Other (See Comments)    intolerance    FAMILY HISTORY:  family history includes Asthma in her father; Breast cancer in her maternal grandmother, mother, and paternal grandmother; Cancer in her maternal grandmother, mother, and paternal grandmother; Diabetes in her father and mother; Epilepsy in her mother and sister; Heart disease in her father; Hypertension in her father and mother; Kidney disease in her father. SOCIAL HISTORY:  reports that she has never smoked. She has never used smokeless tobacco. She reports that she uses drugs, including Oxycodone. She reports that she does not drink alcohol.  REVIEW OF SYSTEMS:   All negative; except for those that are bolded, which indicate positives.  Constitutional: weight loss, weight gain, night sweats, fevers, chills, fatigue, weakness.  HEENT: headaches, sore throat, sneezing, nasal congestion, post nasal drip, difficulty swallowing, tooth/dental problems, visual complaints, visual changes, ear aches. Neuro: difficulty with speech, weakness, numbness, ataxia. CV:   chest pain, orthopnea, PND, swelling in lower extremities, dizziness, palpitations, syncope.  Resp: cough, hemoptysis, dyspnea, wheezing. GI: heartburn, indigestion, abdominal pain, nausea, vomiting, diarrhea, constipation, change in bowel habits, loss of appetite, hematemesis, melena, hematochezia.  GU: dysuria, change in color of urine, urgency or frequency, flank pain, hematuria. MSK: joint pain (left knee) or swelling, decreased range of motion. Psych: change in mood or affect, depression, anxiety, suicidal ideations, homicidal ideations. Skin: rash, itching, bruising.  SUBJECTIVE:  Patient remains on Bipap. Reports continued knee pain.  ETT required  VITAL SIGNS: Temp:  [97.6 F (36.4 C)-98.2 F (36.8 C)] 97.6 F (36.4 C) (05/18 2241) Pulse Rate:  [72-93] 72 (05/19 0042) Resp:  [12-20] 20 (05/19 0042) BP: (87-117)/(47-72) 104/53 (05/19 0042) SpO2:  [82 %-98 %] 97 % (05/19 0042) Weight:  [136.1 kg (300 lb)-150 kg (330 lb 12.8 oz)] 150 kg (330 lb 12.8 oz) (05/18 2136)  PHYSICAL EXAMINATION: General:  Adult female, no distress Neuro:  Lethargic, follows commands, answers questions approprietly  HEENT:  Bipap in place  Cardiovascular:  RRR, no MRG, NI S1/S2 Lungs:  Diminished at bases, distant breat sounds, non-labored  Abdomen:  Obese, active bowel sounds  Musculoskeletal:  Left knee brace in place  Skin:  Warm, dry,  Intact    Recent Labs Lab 07/07/16 1732 07/07/16 1837 07/07/16  2220  NA 143  --  145  K 6.7* 6.3* 5.9*  CL 110  --  114*  CO2 26  --  25  BUN 36*  --  35*  CREATININE 1.11*  --  1.22*  GLUCOSE 90  --  52*    Recent Labs Lab 07/07/16 1732  HGB 11.6*  HCT 37.1  WBC 7.4  PLT 172   Ct Head Wo Contrast  Result Date: 07/07/2016 CLINICAL DATA:  Post fall today striking head on concrete. Headache and blurry vision when trying to read. EXAM: CT HEAD WITHOUT CONTRAST TECHNIQUE: Contiguous axial images were obtained from the base of the skull through the  vertex without intravenous contrast. COMPARISON:  Head CT 04/23/2016 FINDINGS: Brain: No evidence of acute infarction, hemorrhage, hydrocephalus, extra-axial collection or mass lesion/mass effect. Vascular: No hyperdense vessel or unexpected calcification. Skull: No fracture.  No focal lesion. Sinuses/Orbits: Paranasal sinuses and mastoid air cells are clear. Resolved fluid in the sphenoid sinus. The visualized orbits are unremarkable. Other: None. IMPRESSION: No acute intracranial abnormality. Electronically Signed   By: Jeb Levering M.D.   On: 07/07/2016 23:38   Dg Knee Complete 4 Views Left  Result Date: 07/07/2016 CLINICAL DATA:  Acute left knee pain following fall today. Initial encounter. EXAM: LEFT KNEE - COMPLETE 4+ VIEW COMPARISON:  06/10/2015 FINDINGS: Nondisplaced fractures of the upper and lower patella noted. A probable knee effusion is present. There is no evidence of dislocation. Moderate to severe tricompartmental degenerative changes are present. IMPRESSION: Nondisplaced patellar fractures.  Probable knee effusion. Moderate to severe tricompartmental degenerative changes. Electronically Signed   By: Margarette Canada M.D.   On: 07/07/2016 15:25    ASSESSMENT / PLAN:  Acute Encephalopathy in setting of hypercarbia and sedation  ?OHS/OSA, patient  > reported restrictive lung disease secondary to morbi obesity Plan -Monitor - (Currenly improving and protecting airway) -D/C Narcan gtt > patient given 4 PRN doses and noted to have cardiac arrhthymias > currently patient is arousable and able to follow commands  Acute Pain in setting of Left Patellar fracture  Plan  -Limit Sedating medication   Acute Hypercarbic Respiratory Failure H/O Restrictive Lung disease and Asthma  -wears 2L [place] at HS Plan  -Continue BiPAP -Trend ABG  -Xopenex PRN  Hyperkalemia - Resolving  Plan -Trend BMP    Remaining management per primary team   Hayden Pedro, AGACNP-BC [facility]  Pgr: [phone]  PCCM Pgr: [phone]   STAFF NOTE: Linwood Dibbles, MD FACP have personally reviewed patient's available data, including medical history, events of note, physical examination and test results as part of my evaluation. I have discussed with resident/NP and other care providers such as pharmacist, RN and RRT. In addition, I personally evaluated patient and elicited key findings of: updated since above NP note-->required intubation, currently not awake on SBT, last ABg acidotic, coarse bS to clear , obese, nt belly, edema min, imobilized left leg, pcxr I reviewed with NG high ( pushed down), no infiltrate, ett wnl, c/w co2 narcosis , acute resp failure insetting osa / ohs narcotic use for pain, no fever, no asp noted, dc wean NOW, was hyperK from acidosis, increase rest rate 24, abg repeat , likley to improve overnight support and wean in am , abg in am prior, pcxr in am , start feeds after we re confirm placement ogt, limit narcs as able, if needed would prefer precedex if BP would tolerate this, ppi, add sub q hep  with knee on vent, maintain ppi, no role abx, I updated daughter in full, k was treated , most importantly to correct ph, kay given, repeat 1030, may need further therapy The patient is critically ill with multiple organ systems failure and requires high complexity decision making for assessment and support, frequent evaluation and titration of therapies, application of advanced monitoring technologies and extensive interpretation of multiple databases.   Critical Care Time devoted to patient care services described in this note is 30 Minutes. This time reflects time of care of this signee: Merrie Roof, MD FACP. This critical care time does not reflect procedure time, or teaching time or supervisory time of PA/NP/Med student/Med Resident etc but could involve care discussion time. Rest per NP/medical resident whose note is outlined above and that I  agree with   Lavon Paganini. Titus Mould, MD, [place] Pgr: [facility] 07/08/2016 9:02 AM

## 2016-07-08 NOTE — Progress Notes (Signed)
eLink Physician-Brief Progress Note Patient Name: Jillian Eaton DOB: 03-03-1950 MRN: [id number]   Date of Service  07/08/2016  HPI/Events of Note  Patient seen earlier by PCCM and was found to alert following commands on BiPAP.  Now patient is less responsive with TV on BiPAP of approx 350.  Recent ABG is worse 7.07/96/74/25  eICU Interventions  Plan: Anesthesia to intubate Vent and sedation orders in place F/U ABG and PCXR Continue to monitor via [facility]     Intervention Category Major Interventions: Respiratory failure - evaluation and management  DETERDING,ELIZABETH 07/08/2016, 2:51 AM

## 2016-07-08 NOTE — Progress Notes (Signed)
Orogastric tube advanced 10cm per radiologist recommendation. External length of OGT now 53cm from her lower lip.

## 2016-07-08 NOTE — Progress Notes (Signed)
eLink Physician-Brief Progress Note Patient Name: Jillian Eaton DOB: April 30, 1950 MRN: [id number]   Date of Service  07/08/2016  HPI/Events of Note  ABG on 40%/PRVC 18/TV 490/P 5 = 7.315/55.4/76.4/27.4  eICU Interventions  Will order: 1. Increase PRVC rate to 20. 2. Repeat ABG at 8 PM.         [name] 07/08/2016, 6:59 PM

## 2016-07-08 NOTE — Progress Notes (Signed)
eLink Physician-Brief Progress Note Patient Name: Jillian Eaton DOB: [birth year] MRN: [id number]   Date of Service  07/08/2016  HPI/Events of Note  Hyperkalemia  eICU Interventions  Kayexalate 60 gm via tube Recheck BMET later today     Intervention Category Intermediate Interventions: Electrolyte abnormality - evaluation and management  Jillian Eaton 07/08/2016, 5:38 AM

## 2016-07-08 NOTE — Anesthesia Procedure Notes (Signed)
Procedure Name: Intubation Date/Time: 07/08/2016 3:12 AM Performed by: Noralyn Pick D Pre-anesthesia Checklist: Patient identified, Emergency Drugs available, Suction available and Patient being monitored Patient Re-evaluated:Patient Re-evaluated prior to inductionOxygen Delivery Method: Circle system utilized Preoxygenation: Pre-oxygenation with 100% oxygen Intubation Type: IV induction Ventilation: Mask ventilation without difficulty Laryngoscope Size: Mac, 4 and Glidescope Grade View: Grade I Tube type: Oral Number of attempts: 1 Airway Equipment and Method: Stylet Placement Confirmation: ETT inserted through vocal cords under direct vision,  positive ETCO2 and breath sounds checked- equal and bilateral Secured at: 22 cm Tube secured with: Tape Dental Injury: Teeth and Oropharynx as per pre-operative assessment

## 2016-07-08 NOTE — Progress Notes (Signed)
eLink Physician-Brief Progress Note Patient Name: Jillian Eaton DOB: March 26, 1950 MRN: [id number]   Date of Service  07/08/2016  HPI/Events of Note  Hypomag  eICU Interventions  Mag replaced     Intervention Category Intermediate Interventions: Electrolyte abnormality - evaluation and management  Eimi Viney 07/08/2016, 5:15 AM

## 2016-07-08 NOTE — Progress Notes (Signed)
Critical Care Documentation  I was paged regarding acute mental status changes in the patient around 11:07PM.  The patient was finishing her head CT, so I met the patient in room 1237 in the stepdown unit for urgent assessment.  The patient was demonstrating new mental status changes with decreased level of consciousness.  I was already aware that the patient had trouble with hypoglycemia after receiving IV insulin for hyperkalemia in the ED.  She required two additional amps of D50 on the floor and was able to eat a small stack.  However, I was called again after the patient was taken for head CT (ordered on admission) for decreased level of consciousness.  Patient evaluated by me in the stepdown unit.  Hemodynamics stable but patient obtunded.  Glucose confirmed to be around 100.  D51/2NS was started as a precaution.  Narcan was given in 0.51m increments at the bedside because clinical picture was thought to be related to narcotic administration (concerned about cumulative effect although pain medications had been given over an approximate 8 hour span).  Patient began to arouse to voice and follow some commands (though not consistently), however, she also demonstrated relative hypotension after received narcan, responsive to NS bolus.  Because patient did not demonstrate sustained improvement after narcan,  ABG was ordered and PCCM was called for consultation.  Narcan drip was recommended but never started.  ABG showed acute CO2 retention with pH 7.1 and pCO2 84.  BiPAP trial initiated and patient was demonstrating clinical improvement (opening eyes to voice, recognized her daughter, following command with upper and lower extremities, complaining of left knee pain).  Patient's daughter, niece, other relative updated at the bedside.     Time 11:15PM until 12:15PM Critical care time 60 minutes

## 2016-07-08 NOTE — Progress Notes (Signed)
  Echocardiogram 2D Echocardiogram has been performed.  Jillian Eaton 07/08/2016, 10:47 AM

## 2016-07-09 ENCOUNTER — Inpatient Hospital Stay (HOSPITAL_COMMUNITY): Payer: Federal, State, Local not specified - PPO

## 2016-07-09 LAB — CBC WITH DIFFERENTIAL/PLATELET
BASOS ABS: 0 10*3/uL (ref 0.0–0.1)
Basophils Relative: 0 %
EOS ABS: 0.3 10*3/uL (ref 0.0–0.7)
Eosinophils Relative: 4 %
HCT: 30.2 % — ABNORMAL LOW (ref 36.0–46.0)
Hemoglobin: 9.6 g/dL — ABNORMAL LOW (ref 12.0–15.0)
Lymphocytes Relative: 22 %
Lymphs Abs: 1.4 10*3/uL (ref 0.7–4.0)
MCH: 31.5 pg (ref 26.0–34.0)
MCHC: 31.8 g/dL (ref 30.0–36.0)
MCV: 99 fL (ref 78.0–100.0)
MONO ABS: 0.6 10*3/uL (ref 0.1–1.0)
Monocytes Relative: 10 %
Neutro Abs: 4 10*3/uL (ref 1.7–7.7)
Neutrophils Relative %: 64 %
PLATELETS: 125 10*3/uL — AB (ref 150–400)
RBC: 3.05 MIL/uL — ABNORMAL LOW (ref 3.87–5.11)
RDW: 15.1 % (ref 11.5–15.5)
WBC: 6.3 10*3/uL (ref 4.0–10.5)

## 2016-07-09 LAB — COMPREHENSIVE METABOLIC PANEL
ALBUMIN: 2.7 g/dL — AB (ref 3.5–5.0)
ALK PHOS: 71 U/L (ref 38–126)
ALT: 12 U/L — AB (ref 14–54)
AST: 14 U/L — AB (ref 15–41)
Anion gap: 6 (ref 5–15)
BILIRUBIN TOTAL: 0.5 mg/dL (ref 0.3–1.2)
BUN: 29 mg/dL — ABNORMAL HIGH (ref 6–20)
CO2: 31 mmol/L (ref 22–32)
CREATININE: 0.83 mg/dL (ref 0.44–1.00)
Calcium: 6.4 mg/dL — CL (ref 8.9–10.3)
Chloride: 105 mmol/L (ref 101–111)
GFR calc Af Amer: >60 mL/min (ref >60)
GLUCOSE: 112 mg/dL — AB (ref 65–99)
POTASSIUM: 3.6 mmol/L (ref 3.5–5.1)
Sodium: 142 mmol/L (ref 135–145)
TOTAL PROTEIN: 5.7 g/dL — AB (ref 6.5–8.1)

## 2016-07-09 LAB — MAGNESIUM
Magnesium: 1.6 mg/dL — ABNORMAL LOW (ref 1.7–2.4)
Magnesium: 2.2 mg/dL (ref 1.7–2.4)

## 2016-07-09 LAB — GLUCOSE, CAPILLARY
GLUCOSE-CAPILLARY: 87 mg/dL (ref 65–99)
Glucose-Capillary: 116 mg/dL — ABNORMAL HIGH (ref 65–99)
Glucose-Capillary: 101 mg/dL — ABNORMAL HIGH (ref 65–99)
Glucose-Capillary: 84 mg/dL (ref 65–99)
Glucose-Capillary: 85 mg/dL (ref 65–99)
Glucose-Capillary: 81 mg/dL (ref 65–99)

## 2016-07-09 LAB — PHOSPHORUS
Phosphorus: 3.5 mg/dL (ref 2.5–4.6)
Phosphorus: 4 mg/dL (ref 2.5–4.6)

## 2016-07-09 MED ORDER — MAGNESIUM SULFATE 2 GM/50ML IV SOLN
2.0000 g | Freq: Once | INTRAVENOUS | Status: AC
  Administered 2016-07-09: 2 g via INTRAVENOUS
  Filled 2016-07-09: qty 50

## 2016-07-09 MED ORDER — ACETAMINOPHEN 325 MG PO TABS
650.0000 mg | ORAL_TABLET | Freq: Four times a day (QID) | ORAL | Status: DC | PRN
  Administered 2016-07-09: 650 mg
  Filled 2016-07-09: qty 2

## 2016-07-09 MED ORDER — PANTOPRAZOLE SODIUM 40 MG PO PACK
40.0000 mg | PACK | Freq: Every day | ORAL | Status: DC
  Filled 2016-07-09: qty 20

## 2016-07-09 MED ORDER — FENTANYL CITRATE (PF) 100 MCG/2ML IJ SOLN
12.5000 ug | INTRAMUSCULAR | Status: DC | PRN
  Administered 2016-07-09 – 2016-07-10 (×5): 12.5 ug via INTRAVENOUS
  Filled 2016-07-09 (×5): qty 2

## 2016-07-09 MED ORDER — PANTOPRAZOLE SODIUM 40 MG PO TBEC
40.0000 mg | DELAYED_RELEASE_TABLET | Freq: Every day | ORAL | Status: DC
  Administered 2016-07-09 – 2016-07-13 (×5): 40 mg via ORAL
  Filled 2016-07-09 (×5): qty 1

## 2016-07-09 MED ORDER — SODIUM CHLORIDE 0.9 % IV SOLN
1.0000 g | Freq: Once | INTRAVENOUS | Status: AC
  Administered 2016-07-09: 1 g via INTRAVENOUS
  Filled 2016-07-09 (×2): qty 10

## 2016-07-09 MED ORDER — LEVOTHYROXINE SODIUM 100 MCG PO TABS
300.0000 ug | ORAL_TABLET | Freq: Every day | ORAL | Status: DC
  Administered 2016-07-09 – 2016-07-11 (×3): 300 ug
  Filled 2016-07-09 (×4): qty 3

## 2016-07-09 NOTE — Progress Notes (Addendum)
Name: Jillian Eaton MRN: [id number] DOB: 02-Dec-1950    ADMISSION DATE:  07/07/2016 CONSULTATION DATE:  07/08/2016  REFERRING MD :  Dr. 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   On 5/19 patient presented to ED with bilateral knee pain (left greater than right). Patient was at PCP today and after appointment was getting back in car when left leg gave out, she reports falling to ground and hitting her head. Xray revealed left nondisplaced patellar fracture. Patient given Percocet at 1755 and 4 mg morphine at 2106. At 2307 patient was found to have decreased level of consciousness. Responded to PRN Narcan. 4 doses given. ABG 7.11/84.5/84.1. Patient placed on Bipap. PCCM asked to consult.   Recent admission 3/4-3/13 after being found unresponsive with questionable aspiration event, during this admission new found severe systolic HF.    SIGNIFICANT EVENTS  5/18 > Presents s/p fall   STUDIES:  Left Knee Xray 5/18 > nondisplaced patellar fractures, probable knee effusion, moderate to severe tricompartmental degenerative changes  CT Head 5/18 > No acute  SUBJECTIVE:  Awake on wean   VITAL SIGNS: Temp:  [98.8 F (37.1 C)-100.4 F (38 C)] 98.9 F (37.2 C) (05/20 0507) Pulse Rate:  [66-99] 82 (05/20 0700) Resp:  [10-20] 20 (05/20 0700) BP: (91-124)/(49-71) 111/59 (05/20 0600) SpO2:  [91 %-98 %] 96 % (05/20 0810) FiO2 (%):  [40 %] 40 % (05/20 0825) Weight:  [157.7 kg (347 lb 10.7 oz)] 157.7 kg (347 lb 10.7 oz) (05/20 0507)  PHYSICAL EXAMINATION: General: awake on vent, calm Neuro: nonfocal, not lethargic, rass -1 HEENT: jvd wnl PULM: CTA ,slight distant CV:  s1 s2 RRR sys murm early to mid at apex unchanged from  yesterday GI: soft, BS wnl, no r Extremities: no edema, immobilized knee left    Recent Labs Lab 07/08/16 0344 07/08/16 1117 07/09/16 0625  NA 144 148* 142  K 6.4* 5.8* 3.6  CL 112* 114* 105  CO2 26 29 31   BUN 36* 38* 29*  CREATININE 1.40* 1.38* 0.83  GLUCOSE 147* 100* 112*    Recent Labs Lab 07/07/16 1732 07/08/16 0344 07/09/16 0625  HGB 11.6* 11.7* 9.6*  HCT 37.1 38.5 30.2*  WBC 7.4 6.5 6.3  PLT 172 165 125*   Ct Head Wo Contrast  Result Date: 07/07/2016 CLINICAL DATA:  Post fall today striking head on concrete. Headache and blurry vision when trying to read. EXAM: CT HEAD WITHOUT CONTRAST TECHNIQUE: Contiguous axial images were obtained from the base of the skull through the vertex without intravenous contrast. COMPARISON:  Head CT 04/23/2016 FINDINGS: Brain: No evidence of acute infarction, hemorrhage, hydrocephalus, extra-axial collection or mass lesion/mass effect. Vascular: No hyperdense vessel or unexpected calcification. Skull: No fracture.  No focal lesion. Sinuses/Orbits: Paranasal sinuses and mastoid air cells are clear. Resolved fluid in the sphenoid sinus. The visualized orbits are unremarkable. Other: None. IMPRESSION: No acute intracranial abnormality. Electronically Signed   By: Jeb Levering M.D.   On: 07/07/2016 23:38   Dg Chest Port 1 View  Result Date: 07/09/2016 CLINICAL DATA:  Acute respiratory failure EXAM: PORTABLE CHEST 1 VIEW COMPARISON:  07/08/2016 FINDINGS: Support devices are stable. Cardiomegaly with vascular congestion. Bibasilar atelectasis. Possible small right effusion. No real  change since prior study. IMPRESSION: Cardiomegaly with vascular congestion. Bibasilar atelectasis, question small right effusion. Electronically Signed   By: Rolm Baptise M.D.   On: 07/09/2016 07:24   Dg Chest Port 1 View  Result Date: 07/08/2016 CLINICAL DATA:  Orogastric tube placement EXAM: PORTABLE CHEST 1 VIEW COMPARISON:  244 hours today FINDINGS: The heart  remains markedly enlarged. Endotracheal tube is stable. NG tube has been advanced beyond the gastroesophageal junction, and beyond the lower limit of the x-ray. Bibasilar atelectasis. IMPRESSION: NG tube has been advanced beyond the gastroesophageal junction and beyond the lower limit of the study. Electronically Signed   By: Marybelle Killings M.D.   On: 07/08/2016 09:41   Portable Chest Xray  Result Date: 07/08/2016 CLINICAL DATA:  Intubation and OG tube placement EXAM: PORTABLE CHEST 1 VIEW COMPARISON:  Chest radiograph 04/26/2016 FINDINGS: Cardiomegaly is unchanged. The endotracheal tube tip is just below the level of the clavicles. The carina is difficult to distinguish due to patient rotation. There is bibasilar atelectasis without focal consolidation otherwise. Bilateral hilar prominence is unchanged. The orogastric tube side port is at approximately the level of the gastroesophageal junction. This should be advanced by 5-10 cm. IMPRESSION: Endotracheal tube tip just below the level of the clavicles, approximately 2 cm below the level of the clavicular heads. OG tube side port at the level of the gastroesophageal junction. Recommend advancement of the OG tube by 5-10 cm. Electronically Signed   By: Ulyses Jarred M.D.   On: 07/08/2016 03:57   Dg Knee Complete 4 Views Left  Result Date: 07/07/2016 CLINICAL DATA:  Acute left knee pain following fall today. Initial encounter. EXAM: LEFT KNEE - COMPLETE 4+ VIEW COMPARISON:  06/10/2015 FINDINGS: Nondisplaced fractures of the upper and lower patella noted. A probable knee effusion is present. There is no evidence of dislocation. Moderate to severe tricompartmental degenerative changes are present. IMPRESSION: Nondisplaced patellar fractures.  Probable knee effusion. Moderate to severe tricompartmental degenerative changes. Electronically Signed   By: Margarette Canada M.D.   On: 07/07/2016 15:25    ASSESSMENT / PLAN:  Acute Encephalopathy in setting of hypercarbia  and sedation  ?OHS/OSA, patient  > reported restrictive lung disease secondary to morbi obesity Plan -use sedation short acting -limit sedation that suppress resp drive -consider BIPAP nocturnal post extubation   Acute Pain in setting of Left Patellar fracture  Plan  -Limit Sedating medication as able -tylenal  Acute Hypercarbic Respiratory Failure on vent H/O Restrictive Lung disease and Asthma  -wears 2L [place] at HS Plan  -pcxr no defined infiltrate -ABG corrected on vent with MV increase -wean aggressive this am, given ht, TV 275 acceptable -now on PS 8, can reduce to 5, cpap 5, goal 30 min , upright -neurostatus supports extubation if meets criteria -may need nocturnal BIPAP post extubation  ARF, ATN Hyperkalemia - Resolved , was from acidosis Plan -Trend BMP again in am  -no further kayxlate Ph corrected -allow pos balance -dc bicarb  Patellar fx Int low low dose fent Immobilized Will need PT / OT  Hold TF  Ccm time 30 min   Lavon Paganini. Titus Mould, MD, [place] Pgr: [facility] 07/09/2016 8:25 AM

## 2016-07-09 NOTE — Progress Notes (Signed)
The patient is receiving Protonix by the intravenous route.  Based on criteria approved by the Pharmacy and [place], the medication is being converted to the equivalent oral dose form.  These criteria include: -No active GI bleeding -Able to tolerate diet of full liquids (or better) or tube feeding -Able to tolerate other medications by the oral or enteral route  If you have any questions about this conversion, please contact the Pharmacy Department (phone [phone]).  Thank you.   Also IV synthoid >>synthoid per tube at home dose 300 mcg/day  Eudelia Bunch, Pharm.D. [phone] 07/09/2016 8:30 AM

## 2016-07-09 NOTE — Progress Notes (Signed)
Initial Nutrition Assessment  DOCUMENTATION CODES:   Morbid obesity  INTERVENTION:   Diet advancement per MD Once diet advanced, provide Premier Protein BID, each supplement provides 160kcal and 30g protein.    RD to continue to monitor plan  NUTRITION DIAGNOSIS:   Increased nutrient needs related to wound healing as evidenced by estimated needs.  GOAL:   Patient will meet greater than or equal to 90% of their needs  MONITOR:   Diet advancement, Labs, Weight trends, Skin, I & O's  REASON FOR ASSESSMENT:   Consult Enteral/tube feeding initiation and management  ASSESSMENT:   66 year old female with PMH of Anemia, Asthma, Diastolic and Systolic HF (EF 01-00), Chronic Headache, HTN, Small bowel obstruction, Thyroid disease, pulmonary HTN, and restrictive lung disease secondary to morbid obesity (wears 2L [place] at HS).   RD was consulted for TF initiation as patient was on mechanical ventilator over the weekend. Today, pt was extubated. Pt continues to be NPO at this time. Pt with h/o gastroplasty in 1990s. Pt with history of trying to lose weight but weight history shows fluctuations, most likely d/t CHF. Pt would benefit from nutritional supplementation once diet is advanced given morbid obesity and stage II wound on buttocks.   Nutrition focused physical exam shows no sign of depletion of muscle mass or body fat.  Medications: Protonix tablet daily Labs reviewed: CBGs: 101-116 Phos WNL Low Mg  Diet Order:  Diet NPO time specified  Skin:  Wound (see comment) (stage II buttocks pressure injury)  Last BM:  5/19  Height:   Ht Readings from Last 1 Encounters:  07/07/16 5\' 7"  (1.702 m)    Weight:   Wt Readings from Last 1 Encounters:  07/09/16 (!) 347 lb 10.7 oz (157.7 kg)    Ideal Body Weight:  61.4 kg  BMI:  Body mass index is 54.45 kg/m.  Estimated Nutritional Needs:   Kcal:  2000-2200  Protein:  80-90g  Fluid:  1.5-1.7L/day  EDUCATION NEEDS:   No  education needs identified at this time  Clayton Bibles, MS, RD, LDN Pager: [phone] After Hours Pager: [phone]

## 2016-07-09 NOTE — Procedures (Signed)
Extubation Procedure Note  Patient Details:   Name: Jillian Eaton DOB: [birth year] MRN: [id number]   Airway Documentation:     Evaluation  O2 sats: 94 Complications: none Patient tolerated procedure well. Bilateral Breath Sounds: Clear   Pt able to speak  Per CCM order, pt extubated, placed on 3L nasal cannula.  Martha Clan 07/09/2016, 9:24 AM

## 2016-07-10 ENCOUNTER — Inpatient Hospital Stay (HOSPITAL_COMMUNITY): Payer: Federal, State, Local not specified - PPO

## 2016-07-10 DIAGNOSIS — R4182 Altered mental status, unspecified: Secondary | ICD-10-CM

## 2016-07-10 DIAGNOSIS — I5042 Chronic combined systolic (congestive) and diastolic (congestive) heart failure: Secondary | ICD-10-CM

## 2016-07-10 LAB — GLUCOSE, CAPILLARY
GLUCOSE-CAPILLARY: 110 mg/dL — AB (ref 65–99)
GLUCOSE-CAPILLARY: 88 mg/dL (ref 65–99)
GLUCOSE-CAPILLARY: 94 mg/dL (ref 65–99)
Glucose-Capillary: 84 mg/dL (ref 65–99)

## 2016-07-10 MED ORDER — PREMIER PROTEIN SHAKE
11.0000 [oz_av] | Freq: Two times a day (BID) | ORAL | Status: DC
  Administered 2016-07-12 – 2016-07-13 (×2): 11 [oz_av] via ORAL
  Filled 2016-07-10 (×9): qty 325.31

## 2016-07-10 MED ORDER — LOPERAMIDE HCL 2 MG PO CAPS
4.0000 mg | ORAL_CAPSULE | ORAL | Status: DC | PRN
  Administered 2016-07-10 – 2016-07-12 (×2): 4 mg via ORAL
  Filled 2016-07-10 (×2): qty 2

## 2016-07-10 MED ORDER — LIP MEDEX EX OINT
TOPICAL_OINTMENT | CUTANEOUS | Status: AC
  Filled 2016-07-10: qty 7

## 2016-07-10 NOTE — Care Management Note (Signed)
Case Management Note  Patient Details  Name: SOLITA MACADAM MRN: [id number] Date of Birth: 28-Apr-1950  Subjective/Objective:                  66 y.o. woman restrictive lung disease secondary to morbid obesity status post gastroplasty in 1985, asthma, chronic pain syndrome on chronic narcotic therapy (history of unintentional narcotic overdose), fibromyalgia, hypothyroidism, chronic combined CHF, chronic hypoxic respiratory failure with O2 dependence (on 2L [place] at baseline), and severe bilateral arthritis affecting both knees who feels that she was in her baseline state of health today when she had an accidental fall while trying to get into her car (she was actually headed to an ortho clinic appointment).  She says that it felt like her left leg "collapsed" underneath her and her knee "twisted" as she was going to the ground.  She ultimately fell forward, hitting her head on the concrete.  She denies syncope.  No reported stigmata of seizure activity.  Since the fall, she has had headache and blurred vision (reports difficulty reading, even when using her glasses).  She has 10 out of 10 knee pain  Action/Plan: Date:  Jul 10, 2016  Chart reviewed for concurrent status and case management needs.  Will continue to follow patient progress.  Discharge Planning: following for needs  Expected discharge date: [id number]  Velva Harman, BSN, [place], [place]   Expected Discharge Date:   (unknown)               Expected Discharge Plan:  [place]  In-House Referral:  Clinical Social Work  Discharge planning Services  CM Consult  Post Acute Care Choice:    Choice offered to:     DME Arranged:    DME Agency:     HH Arranged:    [place] Agency:     Status of Service:  In process, will continue to follow  If discussed at Long Length of Stay Meetings, dates discussed:    Additional Comments:  Leeroy Cha, RN 07/10/2016, 10:33 AM

## 2016-07-10 NOTE — Procedures (Signed)
Extubation Procedure Note  Patient Details:   Name: Jillian Eaton DOB: 1951-01-22 MRN: [id number]   Airway Documentation:     Evaluation  O2 DKEU:99 Complications: none Patient tolerated procedure well. Bilateral Breath Sounds: Clear, Diminished    Pt able to speak  Martha Clan 07/10/2016, 9:32 AM

## 2016-07-10 NOTE — Progress Notes (Signed)
Name: Jillian Eaton MRN: [id number] DOB: Sep 11, 1950    ADMISSION DATE:  07/07/2016 CONSULTATION DATE:  07/08/2016  REFERRING MD :  Dr. Eulas Post   CHIEF COMPLAINT:  AMS   HISTORY OF PRESENT ILLNESS:   66 year old female with PMH of Anemia, Asthma, Diastolic and Systolic HF (EF 40-08), Chronic Headache, HTN, Small bowel obstruction, Thyroid disease, pulmonary HTN, and restrictive lung disease secondary to morbid obesity (wears 2L [place] at HS).   On 5/19 patient presented to ED with bilateral knee pain (left greater than right). Patient was at PCP today and after appointment was getting back in car when left leg gave out, she reports falling to ground and hitting her head. Xray revealed left nondisplaced patellar fracture. Patient given Percocet at 1755 and 4 mg morphine at 2106. At 2307 patient was found to have decreased level of consciousness. Responded to PRN Narcan. 4 doses given. ABG 7.11/84.5/84.1. Patient placed on Bipap. PCCM asked to consult.   Recent admission 3/4-3/13 after being found unresponsive with questionable aspiration event, during this admission new found severe systolic HF.    SIGNIFICANT EVENTS  5/18 > Presents s/p fall   STUDIES:  Left Knee Xray 5/18 > nondisplaced patellar fractures, probable knee effusion, moderate to severe tricompartmental degenerative changes  CT Head 5/18 > No acute  SUBJECTIVE:  No events overnight, tolerated extubation, c/o right leg pain  VITAL SIGNS: Temp:  [97.8 F (36.6 C)-98.3 F (36.8 C)] 98.1 F (36.7 C) (05/21 0800) Pulse Rate:  [76-84] 79 (05/21 0800) Resp:  [14-20] 14 (05/21 0800) BP: (112-154)/(57-97) 148/83 (05/21 0800) SpO2:  [92 %-98 %] 95 % (05/21 0800)  PHYSICAL EXAMINATION: General: Awake and interactive, moving all ext to command Neuro: nonfocal, not lethargic, rass -1 HEENT: jvd wnl PULM: CTA ,slight distant CV:  s1 s2 RRR sys murm early to mid at apex unchanged from yesterday GI: soft, BS wnl, no  r Extremities: no edema, immobilized knee left    Recent Labs Lab 07/08/16 0344 07/08/16 1117 07/09/16 0625  NA 144 148* 142  K 6.4* 5.8* 3.6  CL 112* 114* 105  CO2 26 29 31   BUN 36* 38* 29*  CREATININE 1.40* 1.38* 0.83  GLUCOSE 147* 100* 112*    Recent Labs Lab 07/07/16 1732 07/08/16 0344 07/09/16 0625  HGB 11.6* 11.7* 9.6*  HCT 37.1 38.5 30.2*  WBC 7.4 6.5 6.3  PLT 172 165 125*   Dg Chest Port 1 View  Result Date: 07/09/2016 CLINICAL DATA:  Acute respiratory failure EXAM: PORTABLE CHEST 1 VIEW COMPARISON:  07/08/2016 FINDINGS: Support devices are stable. Cardiomegaly with vascular congestion. Bibasilar atelectasis. Possible small right effusion. No real change since prior study. IMPRESSION: Cardiomegaly with vascular congestion. Bibasilar atelectasis, question small right effusion. Electronically Signed   By: Rolm Baptise M.D.   On: 07/09/2016 07:24   Dg Chest Port 1 View  Result Date: 07/08/2016 CLINICAL DATA:  Orogastric tube placement EXAM: PORTABLE CHEST 1 VIEW COMPARISON:  244 hours today FINDINGS: The heart remains markedly enlarged. Endotracheal tube is stable. NG tube has been advanced beyond the gastroesophageal junction, and beyond the lower limit of the x-ray. Bibasilar atelectasis. IMPRESSION: NG tube has been advanced beyond the gastroesophageal junction and beyond the lower limit of the study. Electronically Signed   By: Marybelle Killings M.D.   On: 07/08/2016 09:41   I reviewed CXR myself, no acute abnormalities noted  ASSESSMENT / PLAN:  Acute Encephalopathy in setting of hypercarbia and sedation  ?OHS/OSA,  patient  > reported restrictive lung disease secondary to morbi obesity Plan - D/C all sedation - Minimize narcs as able - Titrate O2 for sat of 88-92%  Acute Pain in setting of Left Patellar fracture and right knee pain Plan  - Right knee x-ray - Ortho consult called - Tylenol for pain - No narcs IV  Acute Hypercarbic Respiratory Failure on  vent H/O Restrictive Lung disease and Asthma  -wears 2L [place] at HS Plan  - Titrate O2 for sat of 88-92% - Ambulate as able - PT evaluation - OOB - IS  ARF, ATN Hyperkalemia - Resolved , was from acidosis Plan -Trend BMP again in am  - Replace electrolytes as indicated - KVO IVF - D/C bicarb  Patellar fx Immobilized Ortho consult Will need PT / OT  Transfer to med-sur and to [facility] service with PCCM off 5/22.  Rush Farmer, M.D. [facility]. Pager: [phone]. After hours pager: [phone]  07/10/2016 9:05 AM

## 2016-07-11 DIAGNOSIS — S82009D Unspecified fracture of unspecified patella, subsequent encounter for closed fracture with routine healing: Secondary | ICD-10-CM

## 2016-07-11 DIAGNOSIS — I10 Essential (primary) hypertension: Secondary | ICD-10-CM

## 2016-07-11 DIAGNOSIS — T148XXA Other injury of unspecified body region, initial encounter: Secondary | ICD-10-CM

## 2016-07-11 DIAGNOSIS — J45909 Unspecified asthma, uncomplicated: Secondary | ICD-10-CM

## 2016-07-11 DIAGNOSIS — N179 Acute kidney failure, unspecified: Secondary | ICD-10-CM

## 2016-07-11 LAB — CBC
HEMATOCRIT: 32.8 % — AB (ref 36.0–46.0)
Hemoglobin: 10.6 g/dL — ABNORMAL LOW (ref 12.0–15.0)
MCH: 31.7 pg (ref 26.0–34.0)
MCHC: 32.3 g/dL (ref 30.0–36.0)
MCV: 98.2 fL (ref 78.0–100.0)
PLATELETS: 112 10*3/uL — AB (ref 150–400)
RBC: 3.34 MIL/uL — ABNORMAL LOW (ref 3.87–5.11)
RDW: 14.4 % (ref 11.5–15.5)
WBC: 6.4 10*3/uL (ref 4.0–10.5)

## 2016-07-11 LAB — BASIC METABOLIC PANEL
ANION GAP: 6 (ref 5–15)
BUN: 9 mg/dL (ref 6–20)
CALCIUM: 7.4 mg/dL — AB (ref 8.9–10.3)
CO2: 30 mmol/L (ref 22–32)
Chloride: 105 mmol/L (ref 101–111)
Creatinine, Ser: 0.58 mg/dL (ref 0.44–1.00)
GFR calc Af Amer: >60 mL/min (ref >60)
GFR calc non Af Amer: >60 mL/min (ref >60)
GLUCOSE: 93 mg/dL (ref 65–99)
POTASSIUM: 3.9 mmol/L (ref 3.5–5.1)
Sodium: 141 mmol/L (ref 135–145)

## 2016-07-11 LAB — MAGNESIUM: Magnesium: 1.8 mg/dL (ref 1.7–2.4)

## 2016-07-11 LAB — PHOSPHORUS: Phosphorus: 3.6 mg/dL (ref 2.5–4.6)

## 2016-07-11 MED ORDER — DULOXETINE HCL 60 MG PO CPEP
60.0000 mg | ORAL_CAPSULE | Freq: Every day | ORAL | Status: DC
  Administered 2016-07-11 – 2016-07-13 (×3): 60 mg via ORAL
  Filled 2016-07-11 (×3): qty 1

## 2016-07-11 MED ORDER — LEVOTHYROXINE SODIUM 100 MCG PO TABS
300.0000 ug | ORAL_TABLET | Freq: Every day | ORAL | Status: DC
  Administered 2016-07-12 – 2016-07-13 (×2): 300 ug via ORAL
  Filled 2016-07-11 (×2): qty 3

## 2016-07-11 MED ORDER — CALCIUM CARBONATE ANTACID 500 MG PO CHEW
1.0000 | CHEWABLE_TABLET | Freq: Two times a day (BID) | ORAL | Status: DC
  Administered 2016-07-12 – 2016-07-13 (×3): 200 mg via ORAL
  Filled 2016-07-11 (×3): qty 1

## 2016-07-11 MED ORDER — CARVEDILOL 6.25 MG PO TABS
6.2500 mg | ORAL_TABLET | Freq: Two times a day (BID) | ORAL | Status: DC
  Administered 2016-07-11 – 2016-07-12 (×2): 6.25 mg via ORAL
  Filled 2016-07-11 (×2): qty 1

## 2016-07-11 MED ORDER — PANTOPRAZOLE SODIUM 40 MG PO TBEC: 40.0000 mg | DELAYED_RELEASE_TABLET | Freq: Every day | ORAL | Status: DC

## 2016-07-11 MED ORDER — LISINOPRIL 5 MG PO TABS
2.5000 mg | ORAL_TABLET | Freq: Every day | ORAL | Status: DC
  Administered 2016-07-11 – 2016-07-13 (×3): 2.5 mg via ORAL
  Filled 2016-07-11 (×3): qty 1

## 2016-07-11 NOTE — Evaluation (Addendum)
Physical Therapy Evaluation Patient Details Name: Jillian Eaton MRN: [id number] DOB: Jul 11, 1950 Today's Date: 07/11/2016   History of Present Illness  66 year old with a history of asthma, diastolic and systolic heart failure, hypertension, thyroid disease, restrictive lung disease, who presented 07/08/16 after fall getting into car. found to have nondisplaced left patella fracture. In ED became decreased level of copncsciousness and required intubation after given pain medication and did not respond to Narcan.  intubation. Patient successfully extubated and when necessary at bedtime assumed care.  deiscahrged from [facility] 3/29 after rehab.   Clinical Impression  The patient partially stood from bed at [facility] with 2 assist. Right knee flexed somewhat with caution that the knee not buckle. The patient has been in SNF rehab  In recent [past. Pt admitted with above diagnosis. Pt currently with functional limitations due to the deficits listed below (see PT Problem List).  Pt will benefit from skilled PT to increase their independence and safety with mobility to allow discharge to the venue listed below.   The patient's bed and bath are up 7 steps. Uncertain this will be accomplished before DC. Options could be EMS transport to second level, bed  On first level  or Return  To SNF,     Follow Up Recommendations SNF;Home health PT;Supervision/Assistance - 24 hour    Equipment Recommendations  None recommended by PT    Recommendations for Other Services OT consult     Precautions / Restrictions Precautions Precautions: Fall Required Braces or Orthoses: Knee Immobilizer - Left Knee Immobilizer - Left: On at all times Restrictions Weight Bearing Restrictions: No RLE Weight Bearing: Weight bearing as tolerated LLE Weight Bearing: Weight bearing as tolerated      Mobility  Bed Mobility Overal bed mobility: Needs Assistance Bed Mobility: Rolling;Supine to Sit;Sit to Supine Rolling: Mod assist    Supine to sit: Mod assist;+2 for safety/equipment Sit to supine: +2 for physical assistance;+2 for safety/equipment;Max assist   General bed mobility comments: Patient able to move legs to bed ege with min assist, mod assist to sit upright. Purple slides under which  facilitated moving  with pads. Mod assist for legs  back onto bed. Purple slides used to slide up in the bed.  Transfers Overall transfer level: Needs assistance Equipment used: Rolling walker (2 wheeled) Transfers: Sit to/from Stand Sit to Stand: Mod assist;Max assist;+2 physical assistance;+2 safety/equipment;From elevated surface         General transfer comment: Right knee braced for potential buckling, raised bed slightly. Patient stood partially at the RW, RIGHT KnEE IS FLEXED, DID NOT FULLY STAND ERECT. The right knee blocked for security.   Ambulation/Gait             General Gait Details: DID NOT TEST  Stairs            Wheelchair Mobility    Modified Rankin (Stroke Patients Only)       Balance Overall balance assessment: History of Falls;Needs assistance Sitting-balance support: Bilateral upper extremity supported;Feet supported Sitting balance-Leahy Scale: Fair                                       Pertinent Vitals/Pain Pain Assessment: Faces Faces Pain Scale: Hurts a little bit Pain Location: left knee Pain Descriptors / Indicators: Discomfort Pain Intervention(s): Repositioned;Monitored during session    Home Living Family/patient expects to be discharged to:: Private residence Living Arrangements: Spouse/significant  other Available Help at Discharge: Family;Available 24 hours/day Type of Home: House Home Access: Stairs to enter Entrance Stairs-Rails: None Entrance Stairs-Number of Steps: 1 Home Layout: Two level;Bed/bath upstairs Home Equipment: Tub bench;Walker - 2 wheels;Bedside commode;Wheelchair - manual Additional Comments: Difficulty getting upstairs to  bedroom.    Prior Function Level of Independence: Needs assistance   Gait / Transfers Assistance Needed: has been staying on second level unles goes to appt.            Hand Dominance        Extremity/Trunk Assessment   Upper Extremity Assessment Upper Extremity Assessment: LUE deficits/detail LUE Deficits / Details: ROM at shoulder    Lower Extremity Assessment Lower Extremity Assessment: LLE deficits/detail;RLE deficits/detail RLE Deficits / Details: reports bruised, anle to bear some weight on the  leg with knee flexed. did not stand straight at the RW, does raise  leg from bed LLE Deficits / Details: KI  in place, does raise leg from bed    Cervical / Trunk Assessment Cervical / Trunk Assessment: Normal  Communication      Cognition Arousal/Alertness: Awake/alert Behavior During Therapy: WFL for tasks assessed/performed;Flat affect Overall Cognitive Status: Within Functional Limits for tasks assessed                                        General Comments      Exercises     Assessment/Plan    PT Assessment Patient needs continued PT services  PT Problem List Decreased strength;Decreased range of motion;Decreased activity tolerance;Decreased mobility;Decreased safety awareness;Decreased knowledge of precautions;Decreased knowledge of use of DME;Pain;Obesity       PT Treatment Interventions DME instruction;Gait training;Functional mobility training;Therapeutic activities;Therapeutic exercise;Patient/family education;Stair training    PT Goals (Current goals can be found in the Care Plan section)  Acute Rehab PT Goals Patient Stated Goal: to go home PT Goal Formulation: With patient Time For Goal Achievement: 07/25/16 Potential to Achieve Goals: Fair    Frequency Min 3X/week   Barriers to discharge Inaccessible home environment      Co-evaluation               AM-PAC PT "6 Clicks" Daily Activity  Outcome Measure Difficulty  turning over in bed (including adjusting bedclothes, sheets and blankets)?: A Lot Difficulty moving from lying on back to sitting on the side of the bed? : A Lot Difficulty sitting down on and standing up from a chair with arms (e.g., wheelchair, bedside commode, etc,.)?: Total Help needed moving to and from a bed to chair (including a wheelchair)?: Total Help needed walking in hospital room?: Total Help needed climbing 3-5 steps with a railing? : Total 6 Click Score: 8    End of Session Equipment Utilized During Treatment: Gait belt Activity Tolerance: Patient tolerated treatment well Patient left: in bed;with call bell/phone within reach Nurse Communication: Mobility status PT Visit Diagnosis: Difficulty in walking, not elsewhere classified (R26.2);History of falling (Z91.81)    Time: 1219-7588 PT Time Calculation (min) (ACUTE ONLY): 44 min   Charges:   PT Evaluation $PT Eval Moderate Complexity: 1 Procedure PT Treatments $Therapeutic Activity: 23-37 mins   PT G CodesTresa Endo PT [phone]   Claretha Cooper 07/11/2016, 4:06 PM

## 2016-07-11 NOTE — Consult Note (Signed)
Reason for Consult: Fall, left patella fracture Referring Physician: Ree Kida, MD  Jillian Eaton is an 66 y.o. female.  HPI: Jillian Eaton is a 66 y.o. woman restrictive lung disease secondary to morbid obesity status post gastroplasty in 1985, asthma, chronic pain syndrome on chronic narcotic therapy (history of unintentional narcotic overdose), fibromyalgia, hypothyroidism, chronic combined CHF, chronic hypoxic respiratory failure with O2 dependence (on 2L [place] at baseline), and severe bilateral arthritis affecting both knees who feels that she was in her baseline state of health today when she had an accidental fall while trying to get into her car (she was actually headed to an ortho clinic appointment).  She says that it felt like her left leg "collapsed" underneath her and her knee "twisted" as she was going to the ground.  She ultimately fell forward, hitting her head on the concrete.  She denies syncope.  No reported stigmata of seizure activity.  Since the fall, she has had headache and blurred vision (reports difficulty reading, even when using her glasses).  She has 10 out of 10 knee pain.  No nausea or vomiting.  No chest pain or shortness of breath.  No abdominal pain.  She has increased soreness and muscle aches since the fall.  She has chronic back pain that she attributes to her history of fibromyalgia.    Orthopaedics was consulted on 5/21 for management of the left knee.  Predominant complaints are her chronic bilateral knee pain exacerbated by this new onset increased left knee pain. No upper extremity complaints  Past Medical History:  Diagnosis Date  . Anemia   . Asthma   . Chronic headache   . DOE (dyspnea on exertion)    2D ECHO, 02/12/2012 - EF 60-65%, moderate concentric hypertrophy  . Fibromyalgia    nerve pain"left side at waist level" "can't lay on that side without pain" , "HOB elevation helps"  . Heart murmur   . Hematuria - cause not known   . History of  kidney stones    x 2 '13, '14 surgery to remove  . Hypertension   . SBO (small bowel obstruction) ([place]) 06/07/2013  . SOB (shortness of breath)    NUCLEAR STRESS TEST, 03/02/2009 - no ischemic ST changes or symptoms  . Thyroid disease    "goiter"  . Transfusion history    10 yrs+  . Urinary incontinence, functional     Past Surgical History:  Procedure Laterality Date  . CARDIAC CATHETERIZATION  04/04/2010   No significant obstructive coronary artery disease  . CHOLECYSTECTOMY  1990  . COLONOSCOPY W/ POLYPECTOMY    . COLONOSCOPY WITH PROPOFOL N/A 04/10/2014   Procedure: COLONOSCOPY WITH PROPOFOL;  Surgeon: Beryle Beams, MD;  Location: [facility];  Service: Endoscopy;  Laterality: N/A;  . DIAGNOSTIC LAPAROSCOPY     x2 bowel obstructions(adhesions)  . 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 THYROIDECTOMY    . TUBAL LIGATION  1986    Family History  Problem Relation Age of Onset  . Diabetes Mother   . Epilepsy Mother   . Cancer Mother        Breast  . Hypertension Mother   . Breast cancer Mother   . Kidney disease Father   . Diabetes Father   . Hypertension Father   . Asthma Father   . Heart disease Father   . Epilepsy Sister   . Cancer Maternal Grandmother   . Breast  cancer Maternal Grandmother   . Cancer Paternal Grandmother   . Breast cancer Paternal Grandmother     Social History:  reports that she has never smoked. She has never used smokeless tobacco. She reports that she uses drugs, including Oxycodone. She reports that she does not drink alcohol.  Allergies:  Allergies  Allergen Reactions  . Morphine And Related Other (See Comments)    Respiratory suppression, respiratory failure when given IV  . Penicillins Other (See Comments)    intolerance    Medications: I have reviewed the patient's current medications.  Results for orders placed or performed during the hospital encounter of 07/07/16 (from the past 24  hour(s))  Glucose, capillary     Status: None   Collection Time: 07/10/16  8:16 AM  Result Value Ref Range   Glucose-Capillary 84 65 - 99 mg/dL   Comment 1 Notify RN    Comment 2 Document in Chart   CBC     Status: Abnormal   Collection Time: 07/11/16  3:18 AM  Result Value Ref Range   WBC 6.4 4.0 - 10.5 K/uL   RBC 3.34 (L) 3.87 - 5.11 MIL/uL   Hemoglobin 10.6 (L) 12.0 - 15.0 g/dL   HCT 32.8 (L) 36.0 - 46.0 %   MCV 98.2 78.0 - 100.0 fL   MCH 31.7 26.0 - 34.0 pg   MCHC 32.3 30.0 - 36.0 g/dL   RDW 14.4 11.5 - 15.5 %   Platelets 112 (L) 150 - 400 K/uL  Basic metabolic panel     Status: Abnormal   Collection Time: 07/11/16  3:18 AM  Result Value Ref Range   Sodium 141 135 - 145 mmol/L   Potassium 3.9 3.5 - 5.1 mmol/L   Chloride 105 101 - 111 mmol/L   CO2 30 22 - 32 mmol/L   Glucose, Bld 93 65 - 99 mg/dL   BUN 9 6 - 20 mg/dL   Creatinine, Ser 0.58 0.44 - 1.00 mg/dL   Calcium 7.4 (L) 8.9 - 10.3 mg/dL   GFR calc non Af Amer >60 >60 mL/min   GFR calc Af Amer >60 >60 mL/min   Anion gap 6 5 - 15  Magnesium     Status: None   Collection Time: 07/11/16  3:18 AM  Result Value Ref Range   Magnesium 1.8 1.7 - 2.4 mg/dL  Phosphorus     Status: None   Collection Time: 07/11/16  3:18 AM  Result Value Ref Range   Phosphorus 3.6 2.5 - 4.6 mg/dL    X-ray: CLINICAL DATA:  Acute left knee pain following fall today. Initial encounter.  EXAM: LEFT KNEE - COMPLETE 4+ VIEW  COMPARISON:  06/10/2015  FINDINGS: Nondisplaced fractures of the upper and lower patella noted. A probable knee effusion is present.  There is no evidence of dislocation.  Moderate to severe tricompartmental degenerative changes are present.  IMPRESSION: Nondisplaced patellar fractures.  Probable knee effusion.  Moderate to severe tricompartmental degenerative changes.   Electronically Signed   By: Margarette Canada M.D.  CLINICAL DATA:  Right knee pain since a fall 3 days ago.  EXAM: RIGHT KNEE -  1-2 VIEW  COMPARISON:  06/09/2015  FINDINGS: There is no fracture or dislocation or appreciable joint effusion. 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 Large calcified loose body in the posterior aspect of the joint.  Benign calcifications in the soft tissues anterior to the proximal tibia.  IMPRESSION: No acute abnormalities. Severe osteoarthritis of the medial and  patellofemoral compartments.   Electronically Signed   By: Lorriane Shire M.D.  ROS - as per admitting history and physical As per HPI otherwise 10 systems reviewed and negative  Blood pressure (!) 145/80, pulse 82, temperature 98 F (36.7 C), temperature source Oral, resp. rate 18, height 5\' 7"  (1.702 m), weight (!) 159.2 kg (351 lb), SpO2 98 %.  Physical Exam  Awake alert [place] O2 in place Obese (wt -350lbs) Knee immobilizer on left knee (repositioned today) Pain over anterior aspect of both knees, no palpable defect No active cellulitis  General medical exam reviewed for pertinent findings  Assessment/Plan: Severe bilateral knee osteoarthritis Nondisplaced left patella fracture  No operation required for her left knee Knee immobilizer for 4-6 weeks Routine follow up in office Does have a scheduled appt with Dr. Wynelle Link on 6/1, can keep that appt for follow up of this fracture as reviewed PT consult for mobility and discharge recs due to home physical environment and help WBAT B LE with knee immobilizer in place, no active or passive knee flexion at this point until further directed in office Reviewed weight and effects on her OA and any potential surgery, this dialogue will need to continue through [place] D 07/11/2016, 6:58 AM

## 2016-07-11 NOTE — Progress Notes (Addendum)
PROGRESS NOTE    Jillian Eaton  WUJ:[id number] DOB: December 09, 1950 DOA: 07/07/2016 PCP: Mauricia Area, MD   No chief complaint on file.   Brief Narrative:  66 year old with a history of asthma, diastolic and systolic heart failure, hypertension, thyroid disease, restrictive lung disease, who presented with complaints of left knee pain. Patient was admitted on 07/08/2016 with bilateral knee pain. She had reported falling on the ground and hitting her head. X-ray did show left nondisplaced patellar fracture. Patient was given Percocet as well as morphine and was found to have a decreased level of consciousness. Patient did respond to Narcan. PCCMwas then consulted, patient did require intubation. Patient successfully extubated and when necessary at bedtime assumed care.  Assessment & Plan   Acute encephalopathy secondary over sedation and hypercarbia -Patient does have a history of restrictive lung disease secondary to morbid obesity -Patient Received morphine and Percocet, was found to have decreased level consciousness. Patient did respond to several doses of Narcan. PCCM consulted and patient required intubation. -She was successfully extubated on 07/09/16 -Mental status has resolved  Acute pain secondary to left patellar fracture/Right knee pain -Secondary to fall -Left Xray showed nondisplaced patellar fractures, probable knee effusion. Moderate to severe tricompartmental degenerative changes. -Right knee: No acute abnormalities, sever osteoarthritis of the medial and patellofemoral compartments  -Continue left knee immobilizer -Orthopedic surgery consulted and appreciated, recommended weightbearing as tolerated. No active or passive knee flexion. Patient to follow-up with Dr. Despina Hick on 07/21/16. -PT consulted and pending  History of restrictive lung disease -patient states she uses 2L of home oxygen when needed -Continue nebulizer as needed  Acute renal failure -Resolved,  creatinine peaked at 1.4 -Currently 0.58 -Continue to monitor BMP  Hyperkalemia -Resolved, continue to monitor BMP  Hypocalcemia -Calcium improving slowely -Corrected calcium from 5/20 given albumin was 7.44 -Will start calcium supplementation  Chronic combined systolic/diastolic heart failure -Echocardiogram showed an EF of 60-65%, grade 1 diastolic dysfunction -EF has improved as compared to 04/24/2016, EF is 25-30% at that time. -Patient currently appears to be euvolemic and compensated -Monitor intake and output, daily weights -Home lasix held -Will restart Coreg, lisinopril  Hypothyroidism -Continue synthroid  Essential hypertension -Will restart Coreg and lisinopril  Frequent urination -Patient complains of having to urinate frequently, despite not being on Lasix. -Has mild burning with urination. -Will obtain UA -Currently afebrile with no leukocytosis, will hold off on starting antibiotics at this time.  Morbid obesity -Nutrition consulted -Patient will need to follow up with PCP upon discharge to discuss lifestyle modifications  DVT Prophylaxis  heparin  Code Status: Full  Family Communication: None at bedside  Disposition Plan: admitted. Suspect discharge within 24-48 hours to home. Likely with home health services  Consultants PCCM Orthopedics  Procedures  Echocardiogram  Antibiotics   Anti-infectives    None      Subjective:   Jillian Eaton seen and examined today.  Patient does complain of frequent urination and some burning. Feels her breathing is at baseline. Denies any chest pain or shortness of breath, denies abdominal pain, nausea vomiting, diarrhea or constipation. Continues to have knee pain, more in the left than the right.  Objective:   Vitals:   07/10/16 2031 07/11/16 0019 07/11/16 0500 07/11/16 0510  BP: (!) 156/79   (!) 145/80  Pulse: 75   82  Resp: 18   18  Temp: 98.8 F (37.1 C)   98 F (36.7 C)  TempSrc: Oral   Oral    SpO2: 100%  98%  Weight:  (!) 159.2 kg (351 lb) (!) 159.2 kg (351 lb)   Height:       No intake or output data in the 24 hours ending 07/11/16 1340 Filed Weights   07/09/16 0507 07/11/16 0019 07/11/16 0500  Weight: (!) 157.7 kg (347 lb 10.7 oz) (!) 159.2 kg (351 lb) (!) 159.2 kg (351 lb)    Exam  General: Well developed, well nourished, NAD, appears stated age  HEENT: NCAT, mucous membranes moist.   Cardiovascular: S1 S2 auscultated, 1/6 SEM, RRR  Respiratory: Clear to auscultation bilaterally  Abdomen: Soft, obese, nontender, nondistended, + bowel sounds  Extremities: warm dry without cyanosis clubbing or edema. LLE in immobilizer   Neuro: AAOx3, nonfocal  Psych: Normal affect and demeanor with intact judgement and insight   Data Reviewed: I have personally reviewed following labs and imaging studies  CBC:  Recent Labs Lab 07/07/16 1732 07/08/16 0344 07/09/16 0625 07/11/16 0318  WBC 7.4 6.5 6.3 6.4  NEUTROABS 4.9  --  4.0  --   HGB 11.6* 11.7* 9.6* 10.6*  HCT 37.1 38.5 30.2* 32.8*  MCV 101.1* 104.6* 99.0 98.2  PLT 172 165 125* 112*   Basic Metabolic Panel:  Recent Labs Lab 07/07/16 2220  07/08/16 0344 07/08/16 1112 07/08/16 1117 07/08/16 1708 07/09/16 0625 07/09/16 1650 07/11/16 0318  NA 145  --  144  --  148*  --  142  --  141  K 5.9*  --  6.4*  --  5.8*  --  3.6  --  3.9  CL 114*  --  112*  --  114*  --  105  --  105  CO2 25  --  26  --  29  --  31  --  30  GLUCOSE 52*  --  147*  --  100*  --  112*  --  93  BUN 35*  --  36*  --  38*  --  29*  --  9  CREATININE 1.22*  --  1.40*  --  1.38*  --  0.83  --  0.58  CALCIUM 7.0*  --  6.9*  --  6.9*  --  6.4*  --  7.4*  MG  --   < > 1.6* 2.0  --  1.9 1.6* 2.2 1.8  PHOS  --   < > 8.0* 6.6*  --  5.2* 3.5 4.0 3.6  < > = values in this interval not displayed. GFR: Estimated Creatinine Clearance: 112.8 mL/min (by C-G formula based on SCr of 0.58 mg/dL). Liver Function Tests:  Recent Labs Lab  07/07/16 1732 07/09/16 0625  AST 19 14*  ALT 15 12*  ALKPHOS 84 71  BILITOT 0.4 0.5  PROT 6.8 5.7*  ALBUMIN 3.4* 2.7*   No results for input(s): LIPASE, AMYLASE in the last 168 hours. No results for input(s): AMMONIA in the last 168 hours. Coagulation Profile: No results for input(s): INR, PROTIME in the last 168 hours. Cardiac Enzymes:  Recent Labs Lab 07/08/16 0344  TROPONINI 0.03*   BNP (last 3 results) No results for input(s): PROBNP in the last 8760 hours. HbA1C: No results for input(s): HGBA1C in the last 72 hours. CBG:  Recent Labs Lab 07/09/16 1603 07/09/16 2027 07/09/16 2317 07/10/16 0431 07/10/16 0816  GLUCAP 85 81 87 88 84   Lipid Profile: No results for input(s): CHOL, HDL, LDLCALC, TRIG, CHOLHDL, LDLDIRECT in the last 72 hours. Thyroid Function Tests: No results for input(s): TSH, T4TOTAL,  FREET4, T3FREE, THYROIDAB in the last 72 hours. Anemia Panel: No results for input(s): VITAMINB12, FOLATE, FERRITIN, TIBC, IRON, RETICCTPCT in the last 72 hours. Urine analysis:    Component Value Date/Time   COLORURINE AMBER (A) 04/23/2016 2359   APPEARANCEUR CLOUDY (A) 04/23/2016 2359   LABSPEC 1.030 04/23/2016 2359   PHURINE 5.0 04/23/2016 2359   GLUCOSEU 50 (A) 04/23/2016 2359   HGBUR MODERATE (A) 04/23/2016 2359   BILIRUBINUR SMALL (A) 04/23/2016 2359   KETONESUR 5 (A) 04/23/2016 2359   PROTEINUR >=300 (A) 04/23/2016 2359   UROBILINOGEN 1.0 06/06/2013 2154   NITRITE NEGATIVE 04/23/2016 2359   LEUKOCYTESUR NEGATIVE 04/23/2016 2359   Sepsis Labs: @LABRCNTIP (procalcitonin:4,lacticidven:4)  ) Recent Results (from the past 240 hour(s))  MRSA PCR Screening     Status: None   Collection Time: 07/08/16  1:24 AM  Result Value Ref Range Status   MRSA by PCR NEGATIVE NEGATIVE Final    Comment:        The GeneXpert MRSA Assay (FDA approved for NASAL specimens only), is one component of a comprehensive MRSA colonization surveillance program. It is  not intended to diagnose MRSA infection nor to guide or monitor treatment for MRSA infections.       Radiology Studies: Dg Knee 1-2 Views Right  Result Date: 07/10/2016 CLINICAL DATA:  Right knee pain since a fall 3 days ago. EXAM: RIGHT KNEE - 1-2 VIEW COMPARISON:  06/09/2015 FINDINGS: There is no fracture or dislocation or appreciable joint effusion. 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 Large calcified loose body in the posterior aspect of the joint. Benign calcifications in the soft tissues anterior to the proximal tibia. IMPRESSION: No acute abnormalities. 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 Electronically Signed   By: Francene Boyers M.D.   On: 07/10/2016 10:38     Scheduled Meds: . calcium carbonate  1 tablet Oral BID WC  . carvedilol  6.25 mg Oral BID WC  . diclofenac sodium  2 g Topical Q6H  . DULoxetine  60 mg Oral Daily  . heparin subcutaneous  5,000 Units Subcutaneous Q8H  . [START ON 07/12/2016] levothyroxine  300 mcg Oral QAC breakfast  . lisinopril  2.5 mg Oral Daily  . pantoprazole  40 mg Oral Daily  . protein supplement shake  11 oz Oral BID BM  . sodium chloride flush  3 mL Intravenous Q12H   Continuous Infusions: . sodium chloride 10 mL/hr at 07/10/16 0800  . sodium chloride Stopped (07/08/16 2000)     LOS: 3 days   Time Spent in minutes   30 minutes  Kirsta Probert D.O. on 07/11/2016 at 1:40 PM  Between 7am to 7pm - Pager - [phone]  After 7pm go to [email or web address] - password [email or web address]  And look for the night coverage person covering for me after hours  [facility] Office  [phone]

## 2016-07-11 NOTE — Progress Notes (Signed)
Pt from home with spouse and was active with [facility] home health services prior to admission for HHPT/OT. Will need resumption orders if back to home is the dc plan. PT eval pending.  Marney Doctor RN,BSN,NCM [phone]

## 2016-07-12 MED ORDER — IBUPROFEN 200 MG PO TABS
600.0000 mg | ORAL_TABLET | Freq: Four times a day (QID) | ORAL | Status: DC | PRN
  Administered 2016-07-12 (×2): 600 mg via ORAL
  Filled 2016-07-12 (×2): qty 3

## 2016-07-12 MED ORDER — FUROSEMIDE 20 MG PO TABS
20.0000 mg | ORAL_TABLET | Freq: Every day | ORAL | Status: DC
  Administered 2016-07-12 – 2016-07-13 (×2): 20 mg via ORAL
  Filled 2016-07-12 (×2): qty 1

## 2016-07-12 MED ORDER — METOPROLOL SUCCINATE ER 25 MG PO TB24
25.0000 mg | ORAL_TABLET | Freq: Every day | ORAL | Status: DC
  Administered 2016-07-13: 25 mg via ORAL
  Filled 2016-07-12 (×2): qty 1

## 2016-07-12 MED ORDER — MIRABEGRON ER 25 MG PO TB24
50.0000 mg | ORAL_TABLET | Freq: Every day | ORAL | Status: DC
  Administered 2016-07-12 – 2016-07-13 (×2): 50 mg via ORAL
  Filled 2016-07-12 (×2): qty 2

## 2016-07-12 MED ORDER — TRAMADOL HCL 50 MG PO TABS: 50.0000 mg | ORAL_TABLET | Freq: Four times a day (QID) | ORAL | Status: DC | PRN

## 2016-07-12 MED ORDER — MOMETASONE FURO-FORMOTEROL FUM 200-5 MCG/ACT IN AERO
2.0000 | INHALATION_SPRAY | Freq: Two times a day (BID) | RESPIRATORY_TRACT | Status: DC
  Administered 2016-07-13: 2 via RESPIRATORY_TRACT
  Filled 2016-07-12: qty 8.8

## 2016-07-12 MED ORDER — ALBUTEROL SULFATE (2.5 MG/3ML) 0.083% IN NEBU: 2.5000 mg | INHALATION_SOLUTION | Freq: Four times a day (QID) | RESPIRATORY_TRACT | Status: DC | PRN

## 2016-07-12 NOTE — Progress Notes (Signed)
Patient ID: Jillian Eaton, female   DOB: [birth year], 66 y.o.   MRN: [id number]  PROGRESS NOTE    Jillian Eaton  YBO:[id number] DOB: [birth year] DOA: 07/07/2016 PCP: Red Christians, MD   Brief Narrative:  66 year old with a history of asthma, diastolic and systolic heart failure, hypertension, thyroid disease, restrictive lung disease, who presented with complaints of left knee pain. Patient was admitted on 07/08/2016 with bilateral knee pain. She had reported falling on the ground and hitting her head. X-ray did show left nondisplaced patellar fracture. Patient was given Percocet as well as morphine and was found to have a decreased level of consciousness. Patient did respond to Narcan. PCCM was then consulted, patient did require intubation. Patient was successfully extubated and transferred out of ICU. Mental status is improved.   Assessment & Plan:   Principal Problem:   Hyperkalemia Active Problems:   Morbid obesity due to excess calories (HCC)   Essential hypertension   Altered mental status   Chronic pain syndrome   Asthma   Patellar fracture   Chronic combined systolic (congestive) and diastolic (congestive) heart failure ([place])   Head trauma   Fall at home, initial encounter  Acute encephalopathy secondary over sedation and hypercarbia -Patient does have a history of restrictive lung disease secondary to morbid obesity -Patient Received morphine and Percocet, was found to have decreased level consciousness. Patient did respond to several doses of Narcan. PCCM consulted and patient required intubation. -She was successfully extubated on 07/09/16 -Mental status has resolved  Acute hypercarbic respiratory failure - Probably due to pain medications. - Resolved.  Acute pain secondary to left patellar fracture/Right knee pain -Secondary to fall -Left Xray showed nondisplaced patellar fractures, probable knee effusion. Moderate to severe tricompartmental degenerative  changes. 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 No active or passive knee flexion. Patient to follow-up with Dr. Maureen Ralphs on 07/21/16. -PT/ OT consulted   History of restrictive lung disease -patient states she uses 2L of home oxygen when needed -Continue nebulizer as needed  Acute renal failure -Resolved, creatinine peaked at 1.4 -Currently 0.58 -Continue to monitor BMP  Hyperkalemia -Resolved, continue to monitor BMP  Hypocalcemia -Calcium improving - Continue oral calcium supplementation  Chronic combined systolic/diastolic heart failure -Echocardiogram showed an EF of 27-78%, grade 1 diastolic dysfunction -EF has improved as compared to 04/24/2016, EF was 25-30% at that time. -Patient has positive fluid balance of more than 4500 mL since admission. Restart home Lasix -Monitor intake and output, daily weights -Continue lisinopril. Switch Coreg to Toprol because of history of asthma.  Hypothyroidism -Continue synthroid  Essential hypertension -Blood pressure controlled. Continue lisinopril. Start Toprol and Lasix  Frequent urination in a patient with history of urinary incontinence Restart home Myrbetriq  Morbid obesity -Nutrition consulted -Patient will need to follow up with PCP upon discharge to discuss lifestyle modifications  DVT Prophylaxis  heparin  Code Status: Full  Family Communication: None at bedside  Disposition Plan:  discharge home with home health services in 24-48 hours  Consultants PCCM Orthopedics  Procedures  Echocardiogram 07/08/2016: Left ventricle: The cavity size was normal. There was mild   concentric hypertrophy. Systolic function was normal. The   estimated ejection fraction was in the range of 60% to 65%.   Although no diagnostic regional  wall motion abnormality was   identified, this possibility cannot be completely excluded on the  basis of this study. Doppler parameters are consistent with   abnormal left ventricular relaxation (grade 1 diastolic   dysfunction).  Antibiotics   None    Subjective: Patient seen and examined at bedside. She denies any overnight fever, nausea, vomiting. She still feels weak with complains of lower extremity pain.  Objective: Vitals:   07/11/16 1317 07/11/16 2055 07/12/16 0129 07/12/16 0417  BP: 132/84 (!) 152/77  (!) 156/72  Pulse: 65 76  78  Resp: 16 18  18   Temp: 98.3 F (36.8 C) 98.6 F (37 C)  98 F (36.7 C)  TempSrc: Oral Oral  Oral  SpO2: 100% 99%  98%  Weight:   (!) 158.4 kg (349 lb 3.3 oz)   Height:        Intake/Output Summary (Last 24 hours) at 07/12/16 1253 Last data filed at 07/12/16 0209  Gross per 24 hour  Intake              600 ml  Output                0 ml  Net              600 ml   Filed Weights   07/11/16 0019 07/11/16 0500 07/12/16 0129  Weight: (!) 159.2 kg (351 lb) (!) 159.2 kg (351 lb) (!) 158.4 kg (349 lb 3.3 oz)    Examination:  General exam: Appears calm and comfortable  Respiratory system: Bilateral decreased breath sound at bases With scattered crackles Cardiovascular system: S1 & S2 heard, rate controlled  Gastrointestinal system: Abdomen is nondistended, soft and nontender. Normal bowel sounds heard. Extremities: No cyanosis, clubbing, trace pitting edema   Data Reviewed: I have personally reviewed following labs and imaging studies  CBC:  Recent Labs Lab 07/07/16 1732 07/08/16 0344 07/09/16 0625 07/11/16 0318  WBC 7.4 6.5 6.3 6.4  NEUTROABS 4.9  --  4.0  --   HGB 11.6* 11.7* 9.6* 10.6*  HCT 37.1 38.5 30.2* 32.8*  MCV 101.1* 104.6* 99.0 98.2  PLT 172 165 125* 675*   Basic Metabolic Panel:  Recent Labs Lab 07/07/16 2220  07/08/16 0344 07/08/16 1112 07/08/16 1117 07/08/16 1708 07/09/16 0625 07/09/16 1650  07/11/16 0318  NA 145  --  144  --  148*  --  142  --  141  K 5.9*  --  6.4*  --  5.8*  --  3.6  --  3.9  CL 114*  --  112*  --  114*  --  105  --  105  CO2 25  --  26  --  29  --  31  --  30  GLUCOSE 52*  --  147*  --  100*  --  112*  --  93  BUN 35*  --  36*  --  38*  --  29*  --  9  CREATININE 1.22*  --  1.40*  --  1.38*  --  0.83  --  0.58  CALCIUM 7.0*  --  6.9*  --  6.9*  --  6.4*  --  7.4*  MG  --   < > 1.6* 2.0  --  1.9 1.6* 2.2 1.8  PHOS  --   < > 8.0* 6.6*  --  5.2* 3.5 4.0 3.6  < > = values in this interval not displayed. GFR: Estimated Creatinine Clearance: 112.5 mL/min (by C-G formula based on SCr of 0.58 mg/dL). Liver Function Tests:  Recent Labs Lab  07/07/16 1732 07/09/16 0625  AST 19 14*  ALT 15 12*  ALKPHOS 84 71  BILITOT 0.4 0.5  PROT 6.8 5.7*  ALBUMIN 3.4* 2.7*   No results for input(s): LIPASE, AMYLASE in the last 168 hours. No results for input(s): AMMONIA in the last 168 hours. Coagulation Profile: No results for input(s): INR, PROTIME in the last 168 hours. Cardiac Enzymes:  Recent Labs Lab 07/08/16 0344  TROPONINI 0.03*   BNP (last 3 results) No results for input(s): PROBNP in the last 8760 hours. HbA1C: No results for input(s): HGBA1C in the last 72 hours. CBG:  Recent Labs Lab 07/09/16 1603 07/09/16 2027 07/09/16 2317 07/10/16 0431 07/10/16 0816  GLUCAP 85 81 87 88 84   Lipid Profile: No results for input(s): CHOL, HDL, LDLCALC, TRIG, CHOLHDL, LDLDIRECT in the last 72 hours. Thyroid Function Tests: No results for input(s): TSH, T4TOTAL, FREET4, T3FREE, THYROIDAB in the last 72 hours. Anemia Panel: No results for input(s): VITAMINB12, FOLATE, FERRITIN, TIBC, IRON, RETICCTPCT in the last 72 hours. Sepsis Labs: No results for input(s): PROCALCITON, LATICACIDVEN in the last 168 hours.  Recent Results (from the past 240 hour(s))  MRSA PCR Screening     Status: None   Collection Time: 07/08/16  1:24 AM  Result Value Ref Range  Status   MRSA by PCR NEGATIVE NEGATIVE Final    Comment:        The GeneXpert MRSA Assay (FDA approved for NASAL specimens only), is one component of a comprehensive MRSA colonization surveillance program. It is not intended to diagnose MRSA infection nor to guide or monitor treatment for MRSA infections.          Radiology Studies: No results found.      Scheduled Meds: . calcium carbonate  1 tablet Oral BID WC  . diclofenac sodium  2 g Topical Q6H  . DULoxetine  60 mg Oral Daily  . furosemide  20 mg Oral Daily  . heparin subcutaneous  5,000 Units Subcutaneous Q8H  . levothyroxine  300 mcg Oral QAC breakfast  . lisinopril  2.5 mg Oral Daily  . [START ON 07/13/2016] metoprolol succinate  25 mg Oral Daily  . mirabegron ER  50 mg Oral Daily  . mometasone-formoterol  2 puff Inhalation BID  . pantoprazole  40 mg Oral Daily  . protein supplement shake  11 oz Oral BID BM  . sodium chloride flush  3 mL Intravenous Q12H   Continuous Infusions: . sodium chloride 10 mL/hr at 07/10/16 0800  . sodium chloride Stopped (07/08/16 2000)     LOS: 4 days        Aline August, MD [facility] Pager [phone]  If 7PM-7AM, please contact night-coverage [email or web address] Password [facility] 07/12/2016, 12:53 PM

## 2016-07-12 NOTE — Progress Notes (Signed)
CSW consulted for potential SNF placement. Met with pt at bedside, pt declines SNF. States she was receiving in-home PT prior to admission and wishes to continue. States husband assists her at home.  Pt was in SNF in march 2018 and explains she would be in co-pay days quickly after admission to SNF, which family cannot entertain financially at this time.  CSW signing off.   Sharren Bridge, MSW, LCSW Clinical Social Work 07/12/2016 [phone]

## 2016-07-12 NOTE — Evaluation (Signed)
Occupational Therapy Evaluation Patient Details Name: Jillian Eaton MRN: [id number] DOB: [birth year] Today's Date: 07/12/2016    History of Present Illness 66 year old with a history of asthma, diastolic and systolic heart failure, hypertension, thyroid disease, restrictive lung disease, who presented 07/08/16 after fall getting into car. found to have nondisplaced left patella fracture. In ED became decreased level of copncsciousness and required intubation after given pain medication and did not respond to Narcan.  intubation. Patient successfully extubated and when necessary at bedtime assumed care.  DCed from [facility] 3/29 after rehab.    Clinical Impression    Pt admitted s/p fall ub which pt has a L patella fx.   Pt currently with functional limitations due to the deficits listed below (see OT Problem List).  Pt will benefit from skilled OT to increase their safety and independence with ADL and functional mobility for ADL to facilitate discharge to venue listed below.   Do not feel husband will be able to manage pt at home.  Pt not able to stand this day .  Pt soaked in urine in the bed.  Pt states 'husband good with household stuff but not personal stuff'   Follow Up Recommendations  SNF;Supervision/Assistance - 24 hour    Equipment Recommendations  None recommended by OT       Precautions / Restrictions Precautions Precautions: Fall Required Braces or Orthoses: Knee Immobilizer - Left Knee Immobilizer - Left: On at all times Restrictions Weight Bearing Restrictions: No RLE Weight Bearing: Weight bearing as tolerated LLE Weight Bearing: Weight bearing as tolerated      Mobility Bed Mobility Overal bed mobility: Needs Assistance Bed Mobility: Supine to Sit Rolling: Max assist   Supine to sit: Max assist Sit to supine: Max assist   General bed mobility comments: pt able to sit EOB for ADL activity. Pt S with sitting balance  Transfers Overall transfer level: Needs  assistance   Transfers: Sit to/from Stand Sit to Stand: Total assist         General transfer comment: unable to stand     Balance Overall balance assessment: History of Falls;Needs assistance Sitting-balance support: Bilateral upper extremity supported;Feet supported Sitting balance-Leahy Scale: Good                                     ADL either performed or assessed with clinical judgement   ADL Overall ADL's : Needs assistance/impaired Eating/Feeding: Set up;Sitting   Grooming: Set up;Sitting                                 General ADL Comments: pt able to sit EOB with OT.  Pt not able to stand and not able to attempt to get to [facility]. Pts bed soaked and pt seemed unaware- CNA notified and came to [place] Patient Visual Report: No change from baseline              Pertinent Vitals/Pain Faces Pain Scale: Hurts even more Pain Location: left knee Pain Descriptors / Indicators: Discomfort;Sore Pain Intervention(s): Monitored during session;Repositioned;Limited activity within patient's tolerance;Patient requesting pain meds-RN notified     Hand Dominance     Extremity/Trunk Assessment Upper Extremity Assessment Upper Extremity Assessment: LUE deficits/detail;Generalized weakness LUE Deficits / Details: pt with torn ligaments L shoulder from a year ago.  limited  FF            Communication Communication Communication: No difficulties   Cognition Arousal/Alertness: Awake/alert Behavior During Therapy: WFL for tasks assessed/performed;Flat affect Overall Cognitive Status: Within Functional Limits for tasks assessed                                                Home Living Family/patient expects to be discharged to:: Private residence Living Arrangements: Spouse/significant other Available Help at Discharge: Family;Available 24 hours/day Type of Home: House Home Access: Stairs to enter [employer] of Steps: 1 Entrance Stairs-Rails: None Home Layout: Two level;Bed/bath upstairs Alternate Level Stairs-Number of Steps: 7 Alternate Level Stairs-Rails: Right Bathroom Shower/Tub: Tub/shower unit;Curtain   Bathroom Toilet: Handicapped height     Home Equipment: Tub bench;Walker - 2 wheels;Bedside commode;Wheelchair - manual   Additional Comments: Difficulty getting upstairs to bedroom.      Prior Functioning/Environment Level of Independence: Needs assistance  Gait / Transfers Assistance Needed: has been staying on second level unles goes to appt.               OT Problem List: Decreased strength;Decreased activity tolerance;Decreased safety awareness;Decreased knowledge of use of DME or AE;Impaired UE functional use;Pain;Impaired balance (sitting and/or standing)      OT Treatment/Interventions: Self-care/ADL training;DME and/or AE instruction;Patient/family education;Therapeutic activities    OT Goals(Current goals can be found in the care plan section) Acute Rehab OT Goals Patient Stated Goal: to go home OT Goal Formulation: With patient Time For Goal Achievement: 07/26/16 Potential to Achieve Goals: Fair  OT Frequency: Min 2X/week   Barriers to D/C:               AM-PAC PT "6 Clicks" Daily Activity     Outcome Measure Help from another person eating meals?: None Help from another person taking care of personal grooming?: A Little Help from another person toileting, which includes using toliet, bedpan, or urinal?: Total Help from another person bathing (including washing, rinsing, drying)?: A Lot Help from another person to put on and taking off regular upper body clothing?: Total   6 Click Score: 11   End of Session Equipment Utilized During Treatment: Rolling walker Nurse Communication: Mobility status  Activity Tolerance: Patient tolerated treatment well Patient left: in chair;with call bell/phone within reach  OT Visit Diagnosis: Other  abnormalities of gait and mobility (R26.89);Repeated falls (R29.6);Muscle weakness (generalized) (M62.81);History of falling (Z91.81);Pain                Time: 1443-1540 OT Time Calculation (min): 39 min Charges:  OT General Charges $OT Visit: 1 Procedure OT Evaluation $OT Eval Moderate Complexity: 1 Procedure OT Treatments $Self Care/Home Management : 23-37 mins G-Codes:     Kari Baars, [place]  Payton Mccallum D 07/12/2016, 1:18 PM

## 2016-07-13 MED ORDER — DICLOFENAC SODIUM 1 % TD GEL: 2.0000 g | Freq: Four times a day (QID) | TRANSDERMAL | 1 refills | Status: DC

## 2016-07-13 MED ORDER — TRAMADOL HCL 50 MG PO TABS: 50.0000 mg | ORAL_TABLET | Freq: Four times a day (QID) | ORAL | 0 refills | Status: DC | PRN

## 2016-07-13 MED ORDER — METOPROLOL SUCCINATE ER 25 MG PO TB24: 25.0000 mg | ORAL_TABLET | Freq: Every day | ORAL | 0 refills | Status: DC

## 2016-07-13 MED ORDER — IBUPROFEN 600 MG PO TABS: 600.0000 mg | ORAL_TABLET | Freq: Four times a day (QID) | ORAL | 0 refills | Status: DC | PRN

## 2016-07-13 NOTE — Discharge Summary (Signed)
Physician Discharge Summary  Mitchelle [facility] XTA:[id number] DOB: [birth year] DOA: 07/07/2016  PCP: Red Christians, MD  Admit date: 07/07/2016 Discharge date: 07/13/2016  Admitted From: Home  Disposition:  Home  Recommendations for Outpatient Follow-up:  1. Follow up with PCP in 1-2 weeks 2. Please obtain BMP/CBC in one week 3.   Follow-up with orthopedics/Dr. Wynelle Link as scheduled  Home Health: Yes  Equipment/Devices: None   Discharge Condition: Stable  CODE STATUS: Full Diet recommendation: Heart Healthy   Brief/Interim Summary: 66 year old with a history of asthma, diastolic and systolic heart failure, hypertension, thyroid disease, restrictive lung disease, who presented with complaints of left knee pain. Patient was admitted on 07/08/2016 with bilateral knee pain. She had reported falling on the ground and hitting her head. X-ray did show left nondisplaced patellar fracture. Patient was given Percocet as well as morphine and was found to have a decreased level of consciousness. Patient did respond to Narcan. PCCM was then consulted, patient did require intubation. Patient was successfully extubated and transferred out of ICU. Mental status is improved. Orthopedics recommended outpatient follow-up. Physical therapy evaluated the patient and recommended nursing home placement. She refused nursing home placement. She'll be discharged home with home care  Discharge Diagnoses:  Principal Problem:   Hyperkalemia Active Problems:   Morbid obesity due to excess calories (HCC)   Essential hypertension   Altered mental status   Chronic pain syndrome   Asthma   Patellar fracture   Chronic combined systolic (congestive) and diastolic (congestive) heart failure ([place])   Head trauma   Fall at home, initial encounter  Acute encephalopathy secondary over sedation and hypercarbia -Patient does have a history of restrictive lung disease secondary to morbid obesity -Patient Received morphine  and Percocet, was found to have decreased level consciousness. Patient did respond to several doses of Narcan. PCCM consulted and patient required intubation. -She was successfully extubated on 07/09/16 -Mentalstatus has resolved  Acute hypercarbic respiratory failure - Probably due to pain medications. - Resolved.  Acute pain secondary to left patellar fracture/Right knee pain -Secondary to fall -Left Xray showed nondisplaced patellar fractures, probable knee effusion. Moderate to severe tricompartmental degenerative changes. 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 No active or passive knee flexion. Patient to follow-up with Dr. Maureen Ralphs on 07/21/16. -Home PT OT eval. Patient refused nursing home placement.  History of restrictive lung disease -patient states she uses 2L of home oxygen when needed   Acute renal failure -Resolved, creatinine peaked at 1.4. Outpatient follow-up  Hyperkalemia -Resolved, outpatient follow-up  Hypocalcemia -Calcium improving - Continue oral calcium supplementation  Chronic combined systolic/diastolic heart failure -Echocardiogram showed an EF of 65-53%, grade 1 diastolic dysfunction -EF has improved as compared to 04/24/2016, EF was 25-30% at that time. -Monitor intake and output, daily weights -Continue lisinopril and Lasix. Switch Coreg to Toprol because of history of asthma.  Hypothyroidism -Continue synthroid  Essential hypertension -Blood pressure controlled. Continue lisinopril, Toprol and Lasix  Frequent urination in a patient with history of urinary incontinence Continue home Myrbetriq  Morbid obesity -Patient will need to follow up with PCP upon discharge to discuss lifestyle modifications   Discharge Instructions  Discharge Instructions     Call MD for:  difficulty breathing, headache or visual disturbances    Complete by:  As directed    Call MD for:  extreme fatigue    Complete by:  As directed    Call MD for:  persistant dizziness or light-headedness    Complete by:  As directed    Call MD for:  persistant nausea and vomiting    Complete by:  As directed    Call MD for:  severe uncontrolled pain    Complete by:  As directed    Call MD for:  temperature >100.4    Complete by:  As directed    Diet - low sodium heart healthy    Complete by:  As directed    Increase activity slowly    Complete by:  As directed      Allergies as of 07/13/2016      Reactions   Morphine And Related Other (See Comments)   Respiratory suppression, respiratory failure when given IV   Penicillins Other (See Comments)   intolerance      Medication List    STOP taking these medications   carvedilol 6.25 MG tablet Commonly known as:  COREG     TAKE these medications   acetaminophen 650 MG CR tablet Commonly known as:  TYLENOL Take 650 mg by mouth every 6 (six) hours. 6A, 12P, 6P, 12A   aspirin 81 MG EC tablet Take 1 tablet (81 mg total) by mouth daily.   budesonide-formoterol 160-4.5 MCG/ACT inhaler Commonly known as:  SYMBICORT Inhale 2 puffs into the lungs 2 (two) times daily as needed (shortness of breath.).   calcium carbonate 750 MG chewable tablet Commonly known as:  TUMS EX Chew 1 tablet by mouth daily.   cyclobenzaprine 10 MG tablet Commonly known as:  FLEXERIL Take 1 tablet by mouth 3 (three) times daily.   diclofenac sodium 1 % Gel Commonly known as:  VOLTAREN Apply 2 g topically every 6 (six) hours.   DULoxetine 60 MG capsule Commonly known as:  CYMBALTA Take 1 capsule by mouth daily.   eszopiclone 2 MG Tabs tablet Commonly known as:  LUNESTA Take 2 mg by mouth at bedtime as needed for sleep. Take immediately before bedtime   furosemide 20 MG tablet Commonly known as:  LASIX TAKE 1 TABLET (20 MG  TOTAL) BY MOUTH DAILY.   ibuprofen 600 MG tablet Commonly known as:  ADVIL,MOTRIN Take 1 tablet (600 mg total) by mouth every 6 (six) hours as needed for moderate pain.   levalbuterol 0.63 MG/3ML nebulizer solution Commonly known as:  XOPENEX Take 3 mLs (0.63 mg total) by nebulization every 4 (four) hours as needed for wheezing or shortness of breath.   levothyroxine 300 MCG tablet Commonly known as:  SYNTHROID, LEVOTHROID Take 300 mcg by mouth daily before breakfast.   lisinopril 2.5 MG tablet Commonly known as:  PRINIVIL,ZESTRIL Take 1 tablet (2.5 mg total) by mouth daily.   magnesium hydroxide 400 MG/5ML suspension Commonly known as:  MILK OF MAGNESIA Take 30 mLs by mouth daily as needed for mild constipation. If no BM in 3 days   metoprolol succinate 25 MG 24 hr tablet Commonly known as:  TOPROL-XL Take 1 tablet (25 mg total) by mouth daily. Start taking on:  07/14/2016   MYRBETRIQ 50 MG Tb24 tablet Generic drug:  mirabegron ER Take 50 mg by mouth daily.   OXYGEN Inhale 2 L/min into the lungs continuous.   pantoprazole 40 MG tablet Commonly known as:  PROTONIX Take 1 tablet (40 mg total) by mouth at bedtime.   potassium chloride SA 20 MEQ tablet Commonly known as:  K-DUR,KLOR-CON Take 1 tablet (20 mEq total) by mouth  2 (two) times daily.   PROAIR HFA 108 (90 Base) MCG/ACT inhaler Generic drug:  albuterol Inhale 2 puffs into the lungs every 6 (six) hours as needed for wheezing or shortness of breath.   Vitamin D (Ergocalciferol) 50000 units Caps capsule Commonly known as:  DRISDOL Take 50,000 Units by mouth every 7 (seven) days.      Follow-up Information    Seltzer, Desiree Hane, MD Follow up.   Specialty:  Oncology Contact information: [address] [place] [address] [place] Follow up.   Specialty:  Home Health Services Contact information: [address] [place] [address] [phone]         Gaynelle Arabian, MD Follow up in 1 week(s).   Specialty:  Orthopedic Surgery Why:  keep scheduled appointment Contact information: [address] [place] [address] [phone]          Allergies  Allergen Reactions  . Morphine And Related Other (See Comments)    Respiratory suppression, respiratory failure when given IV  . Penicillins Other (See Comments)    intolerance    Consultations:  Orthopedics   Procedures/Studies: Dg Knee 1-2 Views Right  Result Date: 07/10/2016 CLINICAL DATA:  Right knee pain since a fall 3 days ago. EXAM: RIGHT KNEE - 1-2 VIEW COMPARISON:  06/09/2015 FINDINGS: There is no fracture or dislocation or appreciable joint effusion. 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 Large calcified loose body in the posterior aspect of the joint. Benign calcifications in the soft tissues anterior to the proximal tibia. IMPRESSION: No acute abnormalities. 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 Electronically Signed   By: Lorriane Shire M.D.   On: 07/10/2016 10:38   Ct Head Wo Contrast  Result Date: 07/07/2016 CLINICAL DATA:  Post fall today striking head on concrete. Headache and blurry vision when trying to read. EXAM: CT HEAD WITHOUT CONTRAST TECHNIQUE: Contiguous axial images were obtained from the base of the skull through the vertex without intravenous contrast. COMPARISON:  Head CT 04/23/2016 FINDINGS: Brain: No evidence of acute infarction, hemorrhage, hydrocephalus, extra-axial collection or mass lesion/mass effect. Vascular: No hyperdense vessel or unexpected calcification. Skull: No fracture.  No focal lesion. Sinuses/Orbits: Paranasal sinuses and mastoid air cells are clear. Resolved fluid in the sphenoid sinus. The visualized orbits are unremarkable. Other: None. IMPRESSION: No acute intracranial abnormality. Electronically Signed   By: Jeb Levering M.D.   On: 07/07/2016 23:38    Dg Chest Port 1 View  Result Date: 07/09/2016 CLINICAL DATA:  Acute respiratory failure EXAM: PORTABLE CHEST 1 VIEW COMPARISON:  07/08/2016 FINDINGS: Support devices are stable. Cardiomegaly with vascular congestion. Bibasilar atelectasis. Possible small right effusion. No real change since prior study. IMPRESSION: Cardiomegaly with vascular congestion. Bibasilar atelectasis, question small right effusion. Electronically Signed   By: Rolm Baptise M.D.   On: 07/09/2016 07:24   Dg Chest Port 1 View  Result Date: 07/08/2016 CLINICAL DATA:  Orogastric tube placement EXAM: PORTABLE CHEST 1 VIEW COMPARISON:  244 hours today FINDINGS: The heart remains markedly enlarged. Endotracheal tube is stable. NG tube has been advanced beyond the gastroesophageal junction, and beyond the lower limit of the x-ray. Bibasilar atelectasis. IMPRESSION: NG tube has been advanced beyond the gastroesophageal junction and beyond the lower limit of the study. Electronically Signed   By: Marybelle Killings M.D.   On: 07/08/2016 09:41   Portable Chest Xray  Result Date:  07/08/2016 CLINICAL DATA:  Intubation and OG tube placement EXAM: PORTABLE CHEST 1 VIEW COMPARISON:  Chest radiograph 04/26/2016 FINDINGS: Cardiomegaly is unchanged. The endotracheal tube tip is just below the level of the clavicles. The carina is difficult to distinguish due to patient rotation. There is bibasilar atelectasis without focal consolidation otherwise. Bilateral hilar prominence is unchanged. The orogastric tube side port is at approximately the level of the gastroesophageal junction. This should be advanced by 5-10 cm. IMPRESSION: Endotracheal tube tip just below the level of the clavicles, approximately 2 cm below the level of the clavicular heads. OG tube side port at the level of the gastroesophageal junction. Recommend advancement of the OG tube by 5-10 cm. Electronically Signed   By: Ulyses Jarred M.D.   On: 07/08/2016 03:57   Dg Knee Complete 4 Views  Left  Result Date: 07/07/2016 CLINICAL DATA:  Acute left knee pain following fall today. Initial encounter. EXAM: LEFT KNEE - COMPLETE 4+ VIEW COMPARISON:  06/10/2015 FINDINGS: Nondisplaced fractures of the upper and lower patella noted. A probable knee effusion is present. There is no evidence of dislocation. Moderate to severe tricompartmental degenerative changes are present. IMPRESSION: Nondisplaced patellar fractures.  Probable knee effusion. Moderate to severe tricompartmental degenerative changes. Electronically Signed   By: Margarette Canada M.D.   On: 07/07/2016 15:25    Echocardiogram 07/08/2016: Left ventricle: The cavity size was normal. There was mild concentric hypertrophy. Systolic function was normal. The estimated ejection fraction was in the range of 60% to 65%. Although no diagnostic regional wall motion abnormality was identified, this possibility cannot be completely excluded on the basis of this study. Doppler parameters are consistent with abnormal left ventricular relaxation (grade 1 diastolic dysfunction).    Subjective: Patient seen and examined at bedside. She denies any overnight fever, nausea, vomiting, chest pain.  Discharge Exam: Vitals:   07/13/16 0427 07/13/16 1048  BP: (!) 144/78 (!) 154/82  Pulse: 82 60  Resp: 18   Temp: 98.1 F (36.7 C)    Vitals:   07/13/16 0427 07/13/16 0855 07/13/16 0856 07/13/16 1048  BP: (!) 144/78   (!) 154/82  Pulse: 82   60  Resp: 18     Temp: 98.1 F (36.7 C)     TempSrc: Oral     SpO2: 98% 97% 97%   Weight:      Height:        General: Pt is alert, awake, not in acute distress Cardiovascular: RRR,Rate controlled Respiratory: Bilateral decreased breath sound at bases with scattered crackles Abdominal: Soft, NT, ND, bowel sounds + Extremities: Trace pitting edema, no cyanosis, left knee immobilizer present    The results of significant diagnostics from this hospitalization (including imaging,  microbiology, ancillary and laboratory) are listed below for reference.     Microbiology: Recent Results (from the past 240 hour(s))  MRSA PCR Screening     Status: None   Collection Time: 07/08/16  1:24 AM  Result Value Ref Range Status   MRSA by PCR NEGATIVE NEGATIVE Final    Comment:        The GeneXpert MRSA Assay (FDA approved for NASAL specimens only), is one component of a comprehensive MRSA colonization surveillance program. It is not intended to diagnose MRSA infection nor to guide or monitor treatment for MRSA infections.      Labs: BNP (last 3 results)  Recent Labs  03/10/16 0346  BNP 98.3   Basic Metabolic Panel:  Recent Labs Lab 07/07/16 2220  07/08/16 0344  07/08/16 1112 07/08/16 1117 07/08/16 1708 07/09/16 0625 07/09/16 1650 07/11/16 0318  NA 145  --  144  --  148*  --  142  --  141  K 5.9*  --  6.4*  --  5.8*  --  3.6  --  3.9  CL 114*  --  112*  --  114*  --  105  --  105  CO2 25  --  26  --  29  --  31  --  30  GLUCOSE 52*  --  147*  --  100*  --  112*  --  93  BUN 35*  --  36*  --  38*  --  29*  --  9  CREATININE 1.22*  --  1.40*  --  1.38*  --  0.83  --  0.58  CALCIUM 7.0*  --  6.9*  --  6.9*  --  6.4*  --  7.4*  MG  --   < > 1.6* 2.0  --  1.9 1.6* 2.2 1.8  PHOS  --   < > 8.0* 6.6*  --  5.2* 3.5 4.0 3.6  < > = values in this interval not displayed. Liver Function Tests:  Recent Labs Lab 07/07/16 1732 07/09/16 0625  AST 19 14*  ALT 15 12*  ALKPHOS 84 71  BILITOT 0.4 0.5  PROT 6.8 5.7*  ALBUMIN 3.4* 2.7*   No results for input(s): LIPASE, AMYLASE in the last 168 hours. No results for input(s): AMMONIA in the last 168 hours. CBC:  Recent Labs Lab 07/07/16 1732 07/08/16 0344 07/09/16 0625 07/11/16 0318  WBC 7.4 6.5 6.3 6.4  NEUTROABS 4.9  --  4.0  --   HGB 11.6* 11.7* 9.6* 10.6*  HCT 37.1 38.5 30.2* 32.8*  MCV 101.1* 104.6* 99.0 98.2  PLT 172 165 125* 112*   Cardiac Enzymes:  Recent Labs Lab 07/08/16 0344   TROPONINI 0.03*   BNP: Invalid input(s): POCBNP CBG:  Recent Labs Lab 07/09/16 1603 07/09/16 2027 07/09/16 2317 07/10/16 0431 07/10/16 0816  GLUCAP 85 81 87 88 84   D-Dimer No results for input(s): DDIMER in the last 72 hours. Hgb A1c No results for input(s): HGBA1C in the last 72 hours. Lipid Profile No results for input(s): CHOL, HDL, LDLCALC, TRIG, CHOLHDL, LDLDIRECT in the last 72 hours. Thyroid function studies No results for input(s): TSH, T4TOTAL, T3FREE, THYROIDAB in the last 72 hours.  Invalid input(s): FREET3 Anemia work up No results for input(s): VITAMINB12, FOLATE, FERRITIN, TIBC, IRON, RETICCTPCT in the last 72 hours. Urinalysis    Component Value Date/Time   COLORURINE AMBER (A) 04/23/2016 2359   APPEARANCEUR CLOUDY (A) 04/23/2016 2359   LABSPEC 1.030 04/23/2016 2359   PHURINE 5.0 04/23/2016 2359   GLUCOSEU 50 (A) 04/23/2016 2359   HGBUR MODERATE (A) 04/23/2016 2359   BILIRUBINUR SMALL (A) 04/23/2016 2359   KETONESUR 5 (A) 04/23/2016 2359   PROTEINUR >=300 (A) 04/23/2016 2359   UROBILINOGEN 1.0 06/06/2013 2154   NITRITE NEGATIVE 04/23/2016 2359   LEUKOCYTESUR NEGATIVE 04/23/2016 2359   Sepsis Labs Invalid input(s): PROCALCITONIN,  WBC,  LACTICIDVEN Microbiology Recent Results (from the past 240 hour(s))  MRSA PCR Screening     Status: None   Collection Time: 07/08/16  1:24 AM  Result Value Ref Range Status   MRSA by PCR NEGATIVE NEGATIVE Final    Comment:        The GeneXpert MRSA Assay (FDA approved for NASAL specimens only), is  one component of a comprehensive MRSA colonization surveillance program. It is not intended to diagnose MRSA infection nor to guide or monitor treatment for MRSA infections.      Time coordinating discharge: 35 minutes  SIGNED:   Aline August, MD  [facility] 07/13/2016, 12:46 PM Pager: [phone]  If 7PM-7AM, please contact night-coverage [email or web address] Password [email or web address]

## 2016-07-13 NOTE — Progress Notes (Signed)
This CM met with pt at bedside to discuss dc plans. Pt plans to dc home with [facility] home health services. Pt to use [facility] transport home. Pt states she has home 02 currently and wears it as needed. Pt also states she has a wheelchair and walker at home.  [facility] rep alerted of probable dc today and this CM filled out and printed medical necessity form along with demographics for RN to call [facility]. Marney Doctor RN,BSN,NCM [phone].

## 2016-07-28 ENCOUNTER — Telehealth: Payer: Self-pay | Admitting: Internal Medicine

## 2016-07-28 NOTE — Telephone Encounter (Signed)
LM for Lysbeth Galas w/[facility] that if patient has cardiac concerns r/t fall, please call our office.

## 2016-07-28 NOTE — Telephone Encounter (Signed)
New message       Pt slid out of bed and was no injured but required paramedics to assist her up

## 2016-07-31 ENCOUNTER — Telehealth: Payer: Self-pay | Admitting: Internal Medicine

## 2016-07-31 NOTE — Telephone Encounter (Signed)
Ok to cancel echo in June.  Dr. Lemmie Evens

## 2016-07-31 NOTE — Telephone Encounter (Signed)
Pt said she had an Echo at [facility] in May.she says she does not think she needs the Echo on 08-08-16.Please cancel it if she does not need it.

## 2016-07-31 NOTE — Telephone Encounter (Signed)
Patient had an ECHO on 5/19. Results in Epic. Has another one scheduled on 6/19. Will route to the provider to verify that it is okay to cancel the 6/19 ECHO.

## 2016-08-01 NOTE — Telephone Encounter (Signed)
Patient aware. Echo cancelled.

## 2016-08-08 ENCOUNTER — Other Ambulatory Visit (HOSPITAL_COMMUNITY): Payer: Federal, State, Local not specified - PPO

## 2016-08-17 ENCOUNTER — Emergency Department (HOSPITAL_COMMUNITY)
Admission: EM | Admit: 2016-08-17 | Discharge: 2016-08-18 | Disposition: A | Discharge status: Home / Self Care | Payer: Federal, State, Local not specified - PPO | Attending: Emergency Medicine | Admitting: Emergency Medicine

## 2016-08-17 ENCOUNTER — Encounter (HOSPITAL_COMMUNITY): Payer: Self-pay | Admitting: Nurse Practitioner

## 2016-08-17 DIAGNOSIS — R0602 Shortness of breath: Secondary | ICD-10-CM | POA: Insufficient documentation

## 2016-08-17 DIAGNOSIS — N289 Disorder of kidney and ureter, unspecified: Secondary | ICD-10-CM | POA: Insufficient documentation

## 2016-08-17 DIAGNOSIS — I5042 Chronic combined systolic (congestive) and diastolic (congestive) heart failure: Secondary | ICD-10-CM | POA: Diagnosis not present

## 2016-08-17 DIAGNOSIS — Z79899 Other long term (current) drug therapy: Secondary | ICD-10-CM | POA: Diagnosis not present

## 2016-08-17 DIAGNOSIS — D649 Anemia, unspecified: Secondary | ICD-10-CM

## 2016-08-17 DIAGNOSIS — J45909 Unspecified asthma, uncomplicated: Secondary | ICD-10-CM | POA: Insufficient documentation

## 2016-08-17 NOTE — ED Provider Notes (Addendum)
[facility] DEPT Provider Note   CSN: [id number] Arrival date & time: 08/17/16  2001  By signing my name below, I, Jillian Eaton, attest that this documentation has been prepared under the direction and in the presence of Jillian Fuel, MD. Electronically Signed: Theresia Eaton, ED Scribe. 08/17/16. 11:26 PM.  History   Chief Complaint Shortness of breath  The history is provided by the patient. No language interpreter was used.   HPI Comments: Jillian Eaton is a 66 y.o. female with a PMHx of CHF, who presents to the Emergency Department complaining of persistent SOB onset 2 weeks ago. Pt states pain is exacerbated by exertion and occasionally when lying flat. Pt reports associated dry cough, blurry/double vision, decreased urinary output and mild leg swelling. Pt was referred to the ED by her PCP due to symptoms that have not subsided from her previous doctor's visits. Pt denies CP, chest heaviness, chest tightness, chest pressure or any other complaints at this time.  Past Medical History:  Diagnosis Date  . Anemia   . Asthma   . Chronic headache   . DOE (dyspnea on exertion)    2D ECHO, 02/12/2012 - EF 60-65%, moderate concentric hypertrophy  . Fibromyalgia    nerve pain"left side at waist level" "can't lay on that side without pain" , "HOB elevation helps"  . Heart murmur   . Hematuria - cause not known   . History of kidney stones    x 2 '13, '14 surgery to remove  . Hypertension   . SBO (small bowel obstruction) ([place]) 06/07/2013  . SOB (shortness of breath)    NUCLEAR STRESS TEST, 03/02/2009 - no ischemic ST changes or symptoms  . Thyroid disease    "goiter"  . Transfusion history    10 yrs+  . Urinary incontinence, functional     Patient Active Problem List   Diagnosis Date Noted  . Hyperkalemia 07/07/2016  . Patellar fracture 07/07/2016  . Chronic combined systolic (congestive) and diastolic (congestive) heart failure ([place]) 07/07/2016  . Head trauma  07/07/2016  . Fall at home, initial encounter 07/07/2016  . Sepsis ([place])   . Chronic pain syndrome   . Fibromyalgia   . Asthma   . Accidental drug overdose   . Hypokalemia   . Acute on chronic combined systolic and diastolic CHF (congestive heart failure) ([place])   . Dysphagia   . Atrial fibrillation ([place]) 04/28/2016  . Elevated troponin 04/28/2016  . Aspiration pneumonia ([place]) 04/28/2016  . Vocal cord dysfunction 04/28/2016  . Non-STEMI (non-ST elevated myocardial infarction) ([place]) 04/28/2016  . Thrombocytopenia ([place]) 04/28/2016  . Seizures ([place])   . Encounter for central line placement   . Toxic metabolic encephalopathy 37/90/2409  . Pressure injury of skin 03/11/2016  . Altered mental status 03/10/2016  . Contusion of other part of head   . Opioid overdose   . Leg swelling 10/19/2015  . Acute on chronic respiratory failure with hypoxemia ([place]) 08/03/2015  . Bilateral leg edema 05/11/2015  . Essential hypertension 05/11/2015  . UTI (lower urinary tract infection) 06/07/2013  . Shortness of breath 01/08/2013  . Pulmonary hypertension ([place]) 01/08/2013  . Anemia of chronic disease 03/28/2011  . Asthma 09/22/2010  . Morbid obesity due to excess calories ([place]) 09/22/2010  . LOW BACK PAIN, ACUTE 04/11/2008  . TROCHANTERIC BURSITIS, LEFT 04/11/2008    Past Surgical History:  Procedure Laterality Date  . CARDIAC CATHETERIZATION  04/04/2010   No significant obstructive coronary artery disease  . CHOLECYSTECTOMY  1990  . COLONOSCOPY W/ POLYPECTOMY    . COLONOSCOPY WITH PROPOFOL N/A 04/10/2014   Procedure: COLONOSCOPY WITH PROPOFOL;  Surgeon: Beryle Beams, MD;  Location: [facility] ENDOSCOPY;  Service: Endoscopy;  Laterality: N/A;  . DIAGNOSTIC LAPAROSCOPY     x2 bowel obstructions(adhesions)  . 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 THYROIDECTOMY    . TUBAL LIGATION  1986    OB History    No data available       Home Medications     Prior to Admission medications   Medication Sig Start Date End Date Taking? Authorizing Provider  acetaminophen (TYLENOL) 650 MG CR tablet Take 650 mg by mouth every 6 (six) hours. 6A, 12P, 6P, 12A    [provider]  albuterol (PROAIR HFA) 108 (90 BASE) MCG/ACT inhaler Inhale 2 puffs into the lungs every 6 (six) hours as needed for wheezing or shortness of breath.     [provider]  aspirin EC 81 MG EC tablet Take 1 tablet (81 mg total) by mouth daily. 05/03/16   Rama, Venetia Maxon, MD  budesonide-formoterol (SYMBICORT) 160-4.5 MCG/ACT inhaler Inhale 2 puffs into the lungs 2 (two) times daily as needed (shortness of breath.).     Tanda Rockers, MD  calcium carbonate (TUMS EX) 750 MG chewable tablet Chew 1 tablet by mouth daily.    [provider]  cyclobenzaprine (FLEXERIL) 10 MG tablet Take 1 tablet by mouth 3 (three) times daily. 04/20/16   [provider]  diclofenac sodium (VOLTAREN) 1 % GEL Apply 2 g topically every 6 (six) hours. 07/13/16   Aline August, MD  DULoxetine (CYMBALTA) 60 MG capsule Take 1 capsule by mouth daily. 05/25/16   [provider]  eszopiclone (LUNESTA) 2 MG TABS tablet Take 2 mg by mouth at bedtime as needed for sleep. Take immediately before bedtime    [provider]  furosemide (LASIX) 20 MG tablet TAKE 1 TABLET (20 MG TOTAL) BY MOUTH DAILY. 06/28/16   Hilty, Nadean Corwin, MD  ibuprofen (ADVIL,MOTRIN) 600 MG tablet Take 1 tablet (600 mg total) by mouth every 6 (six) hours as needed for moderate pain. 07/13/16   Aline August, MD  levalbuterol (XOPENEX) 0.63 MG/3ML nebulizer solution Take 3 mLs (0.63 mg total) by nebulization every 4 (four) hours as needed for wheezing or shortness of breath. 05/02/16   Rama, Venetia Maxon, MD  levothyroxine (SYNTHROID, LEVOTHROID) 300 MCG tablet Take 300 mcg by mouth daily before breakfast.    [provider]  lisinopril (PRINIVIL,ZESTRIL) 2.5 MG tablet Take 1 tablet (2.5 mg  total) by mouth daily. 06/06/16 09/04/16  Pixie Casino, MD  magnesium hydroxide (MILK OF MAGNESIA) 400 MG/5ML suspension Take 30 mLs by mouth daily as needed for mild constipation. If no BM in 3 days    [provider]  metoprolol succinate (TOPROL-XL) 25 MG 24 hr tablet Take 1 tablet (25 mg total) by mouth daily. 07/14/16   Aline August, MD  mirabegron ER (MYRBETRIQ) 50 MG TB24 tablet Take 50 mg by mouth daily.    [provider]  OXYGEN Inhale 2 L/min into the lungs continuous.    [provider]  pantoprazole (PROTONIX) 40 MG tablet Take 1 tablet (40 mg total) by mouth at bedtime. 03/12/16   Lavina Hamman, MD  potassium chloride SA (K-DUR,KLOR-CON) 20 MEQ tablet Take 1 tablet (20 mEq total) by mouth 2 (two) times daily. 05/02/16  Rama, Venetia Maxon, MD  Vitamin D, Ergocalciferol, (DRISDOL) 50000 units CAPS capsule Take 50,000 Units by mouth every 7 (seven) days.    [provider]    Family History Family History  Problem Relation Age of Onset  . Diabetes Mother   . Epilepsy Mother   . Cancer Mother        Breast  . Hypertension Mother   . Breast cancer Mother   . Kidney disease Father   . Diabetes Father   . Hypertension Father   . Asthma Father   . Heart disease Father   . Epilepsy Sister   . Cancer Maternal Grandmother   . Breast cancer Maternal Grandmother   . Cancer Paternal Grandmother   . Breast cancer Paternal Grandmother     Social History Social History  Substance Use Topics  . Smoking status: Never Smoker  . Smokeless tobacco: Never Used  . Alcohol use No     Comment: wine occ     Allergies   Morphine and related and Penicillins   Review of Systems Review of Systems  Eyes: Positive for visual disturbance.  Respiratory: Positive for cough and shortness of breath. Negative for chest tightness.   Genitourinary: Positive for decreased urine volume.  All other systems reviewed and are negative.    Physical  Exam Updated Vital Signs BP 135/68   Pulse 86   Temp 98.9 F (37.2 C) (Oral)   Resp (!) 22   SpO2 93%   Physical Exam  Constitutional: She is oriented to person, place, and time. She appears well-developed and well-nourished.  HENT:  Head: Normocephalic and atraumatic.  Eyes: EOM are normal. Pupils are equal, round, and reactive to light.  Neck: Normal range of motion. Neck supple. No JVD present.  Cardiovascular: Normal rate, regular rhythm and normal heart sounds.   No murmur heard. Pulmonary/Chest: Effort normal. She has no wheezes. She has rales. She exhibits no tenderness.  Rales half way up.   Abdominal: Soft. Bowel sounds are normal. She exhibits no distension and no mass. There is no tenderness.  Musculoskeletal: Normal range of motion. She exhibits edema.  1+ pre-tibial edema.   Lymphadenopathy:    She has no cervical adenopathy.  Neurological: She is alert and oriented to person, place, and time. No cranial nerve deficit. She exhibits normal muscle tone. Coordination normal.  Skin: Skin is warm and dry. No rash noted.  Psychiatric: She has a normal mood and affect. Her behavior is normal. Judgment and thought content normal.  Nursing note and vitals reviewed.    ED Treatments / Results  DIAGNOSTIC STUDIES: Oxygen Saturation is 93% on RA, adequate by my interpretation.   COORDINATION OF CARE: 11:15 PM-Discussed next steps with pt including review of blood work and lab tests. Pt verbalized understanding and is agreeable with the plan.   Labs (all labs ordered are listed, but only abnormal results are displayed) Labs Reviewed  COMPREHENSIVE METABOLIC PANEL - Abnormal; Notable for the following:       Result Value   Chloride 113 (*)    Glucose, Bld 135 (*)    BUN 39 (*)    Creatinine, Ser 1.50 (*)    Calcium 6.5 (*)    Total Protein 6.2 (*)    Albumin 3.2 (*)    ALT 10 (*)    GFR calc non Af Amer 36 (*)    GFR calc Af Amer 41 (*)    All other components  within normal limits  CBC WITH DIFFERENTIAL/PLATELET - Abnormal; Notable for the following:    RBC 3.58 (*)    Hemoglobin 10.8 (*)    HCT 35.4 (*)    All other components within normal limits  D-DIMER, QUANTITATIVE (NOT AT [facility]) - Abnormal; Notable for the following:    D-Dimer, Quant 1.37 (*)    All other components within normal limits  BRAIN NATRIURETIC PEPTIDE  I-STAT TROPOININ, ED    EKG  EKG Interpretation  Date/Time:  Friday August 18 2016 00:09:21 EDT Ventricular Rate:  87 PR Interval:    QRS Duration: 99 QT Interval:  367 QTC Calculation: 442 R Axis:   79 Text Interpretation:  Sinus rhythm Consider left atrial enlargement When compared with ECG of 07/07/2016, No significant change was found Confirmed by Jillian Eaton ([id number]) on 08/18/2016 12:12:08 AM       Radiology Dg Chest 2 View  Result Date: 08/18/2016 CLINICAL DATA:  Dyspnea and cough for 3 days. EXAM: CHEST  2 VIEW COMPARISON:  07/09/2016 FINDINGS: Moderate cardiomegaly and aortic tortuosity, unchanged. No consolidation. No effusion. Normal vasculature. IMPRESSION: Unchanged cardiomegaly and aortic tortuosity. No consolidation or effusion. Electronically Signed   By: Andreas Newport M.D.   On: 08/18/2016 00:32   Ct Angio Chest Pe W And/or Wo Contrast  Result Date: 08/18/2016 CLINICAL DATA:  Dyspnea. EXAM: CT ANGIOGRAPHY CHEST WITH CONTRAST TECHNIQUE: Multidetector CT imaging of the chest was performed using the standard protocol during bolus administration of intravenous contrast. Multiplanar CT image reconstructions and MIPs were obtained to evaluate the vascular anatomy. CONTRAST:  80 mL Isovue 370 intravenous COMPARISON:  08/27/2010, 08/18/2016 FINDINGS: Cardiovascular: Satisfactory opacification of the pulmonary arteries to the segmental level. No evidence of pulmonary embolism. There is enlargement of the central pulmonary arteries, raising the question of pulmonary arterial hypertension. Unchanged cardiomegaly. No  pericardial effusion. Mediastinum/Nodes: No adenopathy in the hila or mediastinum. There is mural thickening of the distal thoracic esophagus. Above this level there is gaseous distention of the esophagus. Lungs/Pleura: Right base scarring or atelectasis. Left lung is clear. No pleural effusions. Upper Abdomen: No acute abnormality. Musculoskeletal: No significant skeletal lesion Review of the MIP images confirms the above findings. IMPRESSION: 1. Negative for acute pulmonary embolism. There is enlargement of the central pulmonary arteries, raising the question of pulmonary arterial hypertension. 2. Mural thickening of the distal thoracic esophagus, with gaseous esophageal distention above this level. Cannot exclude an esophageal mass but this could also represent esophagitis. Electronically Signed   By: Andreas Newport M.D.   On: 08/18/2016 06:19    Procedures Procedures (including critical care time)  Medications Ordered in ED Medications  iopamidol (ISOVUE-370) 76 % injection 80 mL (80 mLs Intravenous Contrast Given 08/18/16 0537)     Initial Impression / Assessment and Plan / ED Course  I have reviewed the triage vital signs and the nursing notes.  Pertinent labs & imaging results that were available during my care of the patient were reviewed by me and considered in my medical decision making (see chart for details).  Worsening dyspnea of uncertain cause. Exam is suggestive of CHF with peripheral edema and rales. However, chest x-ray does not show any change over baseline. There is cardiomegaly, but no pulmonary vascular congestion. Old records were reviewed, and she does have a history of systolic heart failure, but most recent ejection fraction 6 weeks ago was 60%. Note from her primary care physician visit stated that they wanted her evaluated for possible CHF, possible pulmonary embolism. BNP is  come back normal. Also, creatinine has gone up from last value, but is actually at a level that  had been present one month ago. D-dimer was sent which came back elevated. She was sent for CT angiogram of the chest, which showed no evidence of pulmonary embolism, suggestion of pulmonary hypertension. At this point, cause of her dyspnea is not clear, but might be pulmonary hypertension. She is advised to increase her furosemide dose to 40 mg a day for the next 3 days. After that, she is to contact her PCP for further instructions.  Final Clinical Impressions(s) / ED Diagnoses   Final diagnoses:  Shortness of breath  Renal insufficiency  Normochromic normocytic anemia    New Prescriptions New Prescriptions   No medications on file   I personally performed the services described in this documentation, which was scribed in my presence. The recorded information has been reviewed and is accurate.        Jillian Fuel, MD 26/37/85 [id number]    Jillian Fuel, MD 27/74/12 [phone]

## 2016-08-17 NOTE — ED Notes (Signed)
Pt was wanting to leave. This RN informed pt it would be in her best interest to stay and make sure she is safe to leave rt her MD sending her in. Pt agrees to stay. Pt family member is very upset and is trying to persuade pt to leave.

## 2016-08-17 NOTE — ED Triage Notes (Addendum)
Patient states PCP called and states patient needed to come to ER but patient is unsure of reason. Patient states she has had worsening exertional shortness of breath over the past few weeks but has resolved over the past few days. Patient states it hasn't been so severe she could not handle with her at home oxygen. Patient uses at home oxygen PRN states has not needed it today. Patient endorses chronic dry cough and increased thirst. Patient denies chest pain, weakness, denies polyphagia, dizziness, lightheadedness. Patient notes PCP was concerned about hypothyroidism but did not tell her specifically what concern was.

## 2016-08-18 ENCOUNTER — Emergency Department (HOSPITAL_COMMUNITY): Payer: Federal, State, Local not specified - PPO

## 2016-08-18 LAB — COMPREHENSIVE METABOLIC PANEL
ALT: 10 U/L — ABNORMAL LOW (ref 14–54)
ANION GAP: 5 (ref 5–15)
AST: 19 U/L (ref 15–41)
Albumin: 3.2 g/dL — ABNORMAL LOW (ref 3.5–5.0)
Alkaline Phosphatase: 82 U/L (ref 38–126)
BILIRUBIN TOTAL: 0.5 mg/dL (ref 0.3–1.2)
BUN: 39 mg/dL — ABNORMAL HIGH (ref 6–20)
CHLORIDE: 113 mmol/L — AB (ref 101–111)
CO2: 22 mmol/L (ref 22–32)
Calcium: 6.5 mg/dL — ABNORMAL LOW (ref 8.9–10.3)
Creatinine, Ser: 1.5 mg/dL — ABNORMAL HIGH (ref 0.44–1.00)
GFR, EST AFRICAN AMERICAN: 41 mL/min — AB (ref >60)
GFR, EST NON AFRICAN AMERICAN: 36 mL/min — AB (ref >60)
Glucose, Bld: 135 mg/dL — ABNORMAL HIGH (ref 65–99)
POTASSIUM: 4.8 mmol/L (ref 3.5–5.1)
Sodium: 140 mmol/L (ref 135–145)
TOTAL PROTEIN: 6.2 g/dL — AB (ref 6.5–8.1)

## 2016-08-18 LAB — CBC WITH DIFFERENTIAL/PLATELET
BASOS ABS: 0 10*3/uL (ref 0.0–0.1)
Basophils Relative: 1 %
Eosinophils Absolute: 0.3 10*3/uL (ref 0.0–0.7)
Eosinophils Relative: 4 %
HCT: 35.4 % — ABNORMAL LOW (ref 36.0–46.0)
HEMOGLOBIN: 10.8 g/dL — AB (ref 12.0–15.0)
LYMPHS ABS: 1.8 10*3/uL (ref 0.7–4.0)
LYMPHS PCT: 28 %
MCH: 30.2 pg (ref 26.0–34.0)
MCHC: 30.5 g/dL (ref 30.0–36.0)
MCV: 98.9 fL (ref 78.0–100.0)
Monocytes Absolute: 0.7 10*3/uL (ref 0.1–1.0)
Monocytes Relative: 11 %
NEUTROS ABS: 3.6 10*3/uL (ref 1.7–7.7)
NEUTROS PCT: 56 %
Platelets: 182 10*3/uL (ref 150–400)
RBC: 3.58 MIL/uL — AB (ref 3.87–5.11)
RDW: 14.8 % (ref 11.5–15.5)
WBC: 6.5 10*3/uL (ref 4.0–10.5)

## 2016-08-18 LAB — I-STAT TROPONIN, ED: Troponin i, poc: 0.01 ng/mL (ref 0.00–0.08)

## 2016-08-18 LAB — BRAIN NATRIURETIC PEPTIDE: B Natriuretic Peptide: 28.9 pg/mL (ref 0.0–100.0)

## 2016-08-18 LAB — D-DIMER, QUANTITATIVE (NOT AT ARMC): D DIMER QUANT: 1.37 ug{FEU}/mL — AB (ref 0.00–0.50)

## 2016-08-18 MED ORDER — IOPAMIDOL (ISOVUE-370) INJECTION 76%
80.0000 mL | Freq: Once | INTRAVENOUS | Status: AC | PRN
  Administered 2016-08-18: 80 mL via INTRAVENOUS

## 2016-08-18 NOTE — ED Notes (Signed)
Patient transported to CT 

## 2016-08-18 NOTE — ED Notes (Signed)
Pt reports using 2L O2 [place] at home, placed on 2L [place].

## 2016-08-18 NOTE — ED Notes (Signed)
IV team at bedside 

## 2016-08-18 NOTE — Discharge Instructions (Signed)
Increase your furosemide (Lasix) to 40 mg (two tablets) a day for the next three days. After that, follow the instructions of your primary care provider. If your breathing does not improve, your primary care provider may refer you to a pulmonologist (a lung specialist)

## 2016-08-18 NOTE — ED Notes (Signed)
Per main lab, D-dimer result still pending. Will update pt and family to delay.

## 2016-08-28 ENCOUNTER — Emergency Department (HOSPITAL_COMMUNITY): Payer: Federal, State, Local not specified - PPO

## 2016-08-28 ENCOUNTER — Encounter (HOSPITAL_COMMUNITY): Payer: Self-pay | Admitting: *Deleted

## 2016-08-28 ENCOUNTER — Observation Stay (HOSPITAL_COMMUNITY)
Admission: EM | Admit: 2016-08-28 | Discharge: 2016-08-30 | Disposition: A | Discharge status: Home / Self Care | Payer: Federal, State, Local not specified - PPO | Attending: Internal Medicine | Admitting: Internal Medicine

## 2016-08-28 DIAGNOSIS — I5043 Acute on chronic combined systolic (congestive) and diastolic (congestive) heart failure: Secondary | ICD-10-CM | POA: Diagnosis not present

## 2016-08-28 DIAGNOSIS — F32A Depression, unspecified: Secondary | ICD-10-CM | POA: Diagnosis present

## 2016-08-28 DIAGNOSIS — I252 Old myocardial infarction: Secondary | ICD-10-CM | POA: Insufficient documentation

## 2016-08-28 DIAGNOSIS — N189 Chronic kidney disease, unspecified: Secondary | ICD-10-CM | POA: Insufficient documentation

## 2016-08-28 DIAGNOSIS — K219 Gastro-esophageal reflux disease without esophagitis: Secondary | ICD-10-CM | POA: Diagnosis present

## 2016-08-28 DIAGNOSIS — I10 Essential (primary) hypertension: Secondary | ICD-10-CM | POA: Diagnosis present

## 2016-08-28 DIAGNOSIS — Z8249 Family history of ischemic heart disease and other diseases of the circulatory system: Secondary | ICD-10-CM | POA: Insufficient documentation

## 2016-08-28 DIAGNOSIS — Z6841 Body Mass Index (BMI) 40.0 and over, adult: Secondary | ICD-10-CM | POA: Diagnosis not present

## 2016-08-28 DIAGNOSIS — E039 Hypothyroidism, unspecified: Secondary | ICD-10-CM | POA: Insufficient documentation

## 2016-08-28 DIAGNOSIS — J449 Chronic obstructive pulmonary disease, unspecified: Secondary | ICD-10-CM | POA: Diagnosis present

## 2016-08-28 DIAGNOSIS — Z79899 Other long term (current) drug therapy: Secondary | ICD-10-CM | POA: Diagnosis not present

## 2016-08-28 DIAGNOSIS — Z841 Family history of disorders of kidney and ureter: Secondary | ICD-10-CM | POA: Insufficient documentation

## 2016-08-28 DIAGNOSIS — F329 Major depressive disorder, single episode, unspecified: Secondary | ICD-10-CM | POA: Diagnosis not present

## 2016-08-28 DIAGNOSIS — Z9981 Dependence on supplemental oxygen: Secondary | ICD-10-CM | POA: Insufficient documentation

## 2016-08-28 DIAGNOSIS — M25569 Pain in unspecified knee: Secondary | ICD-10-CM

## 2016-08-28 DIAGNOSIS — Z7982 Long term (current) use of aspirin: Secondary | ICD-10-CM | POA: Insufficient documentation

## 2016-08-28 DIAGNOSIS — D638 Anemia in other chronic diseases classified elsewhere: Secondary | ICD-10-CM | POA: Diagnosis not present

## 2016-08-28 DIAGNOSIS — I130 Hypertensive heart and chronic kidney disease with heart failure and stage 1 through stage 4 chronic kidney disease, or unspecified chronic kidney disease: Secondary | ICD-10-CM | POA: Insufficient documentation

## 2016-08-28 DIAGNOSIS — M25562 Pain in left knee: Secondary | ICD-10-CM | POA: Diagnosis present

## 2016-08-28 DIAGNOSIS — I5023 Acute on chronic systolic (congestive) heart failure: Secondary | ICD-10-CM

## 2016-08-28 DIAGNOSIS — E890 Postprocedural hypothyroidism: Secondary | ICD-10-CM | POA: Diagnosis present

## 2016-08-28 DIAGNOSIS — J45909 Unspecified asthma, uncomplicated: Secondary | ICD-10-CM | POA: Insufficient documentation

## 2016-08-28 DIAGNOSIS — E875 Hyperkalemia: Secondary | ICD-10-CM | POA: Diagnosis not present

## 2016-08-28 DIAGNOSIS — G8929 Other chronic pain: Secondary | ICD-10-CM | POA: Diagnosis not present

## 2016-08-28 DIAGNOSIS — I5031 Acute diastolic (congestive) heart failure: Secondary | ICD-10-CM | POA: Diagnosis present

## 2016-08-28 LAB — CBC WITH DIFFERENTIAL/PLATELET
Basophils Absolute: 0 K/uL (ref 0.0–0.1)
Basophils Relative: 0 %
Eosinophils Absolute: 0.3 K/uL (ref 0.0–0.7)
Eosinophils Relative: 4 %
HCT: 34 % — ABNORMAL LOW (ref 36.0–46.0)
Hemoglobin: 10.7 g/dL — ABNORMAL LOW (ref 12.0–15.0)
Lymphocytes Relative: 27 %
Lymphs Abs: 1.8 K/uL (ref 0.7–4.0)
MCH: 30.6 pg (ref 26.0–34.0)
MCHC: 31.5 g/dL (ref 30.0–36.0)
MCV: 97.1 fL (ref 78.0–100.0)
Monocytes Absolute: 0.5 K/uL (ref 0.1–1.0)
Monocytes Relative: 8 %
Neutro Abs: 3.9 K/uL (ref 1.7–7.7)
Neutrophils Relative %: 61 %
Platelets: 136 K/uL — ABNORMAL LOW (ref 150–400)
RBC: 3.5 MIL/uL — ABNORMAL LOW (ref 3.87–5.11)
RDW: 14.7 % (ref 11.5–15.5)
WBC: 6.5 K/uL (ref 4.0–10.5)

## 2016-08-28 LAB — BASIC METABOLIC PANEL WITH GFR
Anion gap: 8 (ref 5–15)
BUN: 28 mg/dL — ABNORMAL HIGH (ref 6–20)
CO2: 25 mmol/L (ref 22–32)
Calcium: 7.6 mg/dL — ABNORMAL LOW (ref 8.9–10.3)
Chloride: 112 mmol/L — ABNORMAL HIGH (ref 101–111)
Creatinine, Ser: 0.96 mg/dL (ref 0.44–1.00)
GFR calc Af Amer: >60 mL/min
GFR calc non Af Amer: >60 mL/min
Glucose, Bld: 79 mg/dL (ref 65–99)
Potassium: 6 mmol/L — ABNORMAL HIGH (ref 3.5–5.1)
Sodium: 145 mmol/L (ref 135–145)

## 2016-08-28 LAB — GLUCOSE, CAPILLARY: Glucose-Capillary: 89 mg/dL (ref 65–99)

## 2016-08-28 LAB — I-STAT TROPONIN, ED: TROPONIN I, POC: 0 ng/mL (ref 0.00–0.08)

## 2016-08-28 LAB — PROTIME-INR
INR: 1.14
PROTHROMBIN TIME: 14.7 s (ref 11.4–15.2)

## 2016-08-28 LAB — BRAIN NATRIURETIC PEPTIDE: B NATRIURETIC PEPTIDE 5: 29.5 pg/mL (ref 0.0–100.0)

## 2016-08-28 MED ORDER — CALCIUM GLUCONATE 10 % IV SOLN: 1.0000 g | Freq: Once | INTRAVENOUS | Status: DC

## 2016-08-28 MED ORDER — INSULIN ASPART 100 UNIT/ML IV SOLN
5.0000 [IU] | Freq: Once | INTRAVENOUS | Status: AC
Start: 2016-08-28 — End: 2016-08-28
  Administered 2016-08-28: 5 [IU] via INTRAVENOUS
  Filled 2016-08-28: qty 0.05

## 2016-08-28 MED ORDER — MAGNESIUM HYDROXIDE 400 MG/5ML PO SUSP: 30.0000 mL | Freq: Every day | ORAL | Status: DC | PRN

## 2016-08-28 MED ORDER — CYCLOBENZAPRINE HCL 10 MG PO TABS
10.0000 mg | ORAL_TABLET | Freq: Three times a day (TID) | ORAL | Status: DC
  Administered 2016-08-28 – 2016-08-30 (×6): 10 mg via ORAL
  Filled 2016-08-28 (×6): qty 1

## 2016-08-28 MED ORDER — SODIUM CHLORIDE 0.9 % IV SOLN
1.0000 g | Freq: Once | INTRAVENOUS | Status: AC
  Administered 2016-08-28: 1 g via INTRAVENOUS
  Filled 2016-08-28: qty 10

## 2016-08-28 MED ORDER — PREGABALIN 50 MG PO CAPS
200.0000 mg | ORAL_CAPSULE | Freq: Three times a day (TID) | ORAL | Status: DC
  Administered 2016-08-28 – 2016-08-30 (×6): 200 mg via ORAL
  Filled 2016-08-28 (×6): qty 4

## 2016-08-28 MED ORDER — FUROSEMIDE 10 MG/ML IJ SOLN
40.0000 mg | Freq: Once | INTRAMUSCULAR | Status: AC
  Administered 2016-08-28: 40 mg via INTRAVENOUS
  Filled 2016-08-28: qty 4

## 2016-08-28 MED ORDER — FUROSEMIDE 20 MG PO TABS
20.0000 mg | ORAL_TABLET | Freq: Every day | ORAL | Status: DC
  Administered 2016-08-29 – 2016-08-30 (×2): 20 mg via ORAL
  Filled 2016-08-28 (×2): qty 1

## 2016-08-28 MED ORDER — MOMETASONE FURO-FORMOTEROL FUM 200-5 MCG/ACT IN AERO
2.0000 | INHALATION_SPRAY | Freq: Two times a day (BID) | RESPIRATORY_TRACT | Status: DC
  Administered 2016-08-29 – 2016-08-30 (×2): 2 via RESPIRATORY_TRACT
  Filled 2016-08-28: qty 8.8

## 2016-08-28 MED ORDER — ASPIRIN EC 81 MG PO TBEC
81.0000 mg | DELAYED_RELEASE_TABLET | Freq: Every day | ORAL | Status: DC
  Administered 2016-08-29 – 2016-08-30 (×2): 81 mg via ORAL
  Filled 2016-08-28 (×2): qty 1

## 2016-08-28 MED ORDER — ACETAMINOPHEN 325 MG PO TABS
650.0000 mg | ORAL_TABLET | Freq: Four times a day (QID) | ORAL | Status: DC
  Administered 2016-08-28 – 2016-08-30 (×5): 650 mg via ORAL
  Filled 2016-08-28 (×5): qty 2

## 2016-08-28 MED ORDER — ALBUTEROL SULFATE (2.5 MG/3ML) 0.083% IN NEBU
10.0000 mg | INHALATION_SOLUTION | Freq: Once | RESPIRATORY_TRACT | Status: AC
  Administered 2016-08-28: 10 mg via RESPIRATORY_TRACT
  Filled 2016-08-28: qty 12

## 2016-08-28 MED ORDER — CARVEDILOL 6.25 MG PO TABS
6.2500 mg | ORAL_TABLET | Freq: Two times a day (BID) | ORAL | Status: DC
  Administered 2016-08-28 – 2016-08-30 (×4): 6.25 mg via ORAL
  Filled 2016-08-28 (×6): qty 1

## 2016-08-28 MED ORDER — ONDANSETRON HCL 4 MG/2ML IJ SOLN: 4.0000 mg | Freq: Four times a day (QID) | INTRAMUSCULAR | Status: DC | PRN

## 2016-08-28 MED ORDER — SODIUM CHLORIDE 0.9 % IV BOLUS (SEPSIS): 500.0000 mL | Freq: Once | INTRAVENOUS | Status: DC

## 2016-08-28 MED ORDER — MIRABEGRON ER 25 MG PO TB24
50.0000 mg | ORAL_TABLET | Freq: Every day | ORAL | Status: DC
  Administered 2016-08-29 – 2016-08-30 (×2): 50 mg via ORAL
  Filled 2016-08-28 (×2): qty 2

## 2016-08-28 MED ORDER — AMLODIPINE BESYLATE 5 MG PO TABS: 10.0000 mg | ORAL_TABLET | Freq: Every day | ORAL | Status: DC

## 2016-08-28 MED ORDER — LEVOTHYROXINE SODIUM 100 MCG PO TABS
300.0000 ug | ORAL_TABLET | Freq: Every day | ORAL | Status: DC
  Administered 2016-08-29 – 2016-08-30 (×2): 300 ug via ORAL
  Filled 2016-08-28 (×2): qty 3

## 2016-08-28 MED ORDER — AMLODIPINE BESYLATE 5 MG PO TABS
5.0000 mg | ORAL_TABLET | Freq: Every day | ORAL | Status: DC
  Filled 2016-08-28 (×2): qty 1

## 2016-08-28 MED ORDER — ENOXAPARIN SODIUM 40 MG/0.4ML ~~LOC~~ SOLN
40.0000 mg | Freq: Every day | SUBCUTANEOUS | Status: DC
  Administered 2016-08-28: 40 mg via SUBCUTANEOUS
  Filled 2016-08-28: qty 0.4

## 2016-08-28 MED ORDER — ONDANSETRON HCL 4 MG PO TABS: 4.0000 mg | ORAL_TABLET | Freq: Four times a day (QID) | ORAL | Status: DC | PRN

## 2016-08-28 MED ORDER — HYDROCODONE-ACETAMINOPHEN 5-325 MG PO TABS
1.0000 | ORAL_TABLET | Freq: Once | ORAL | Status: AC
  Administered 2016-08-28: 1 via ORAL
  Filled 2016-08-28: qty 1

## 2016-08-28 MED ORDER — DULOXETINE HCL 60 MG PO CPEP
60.0000 mg | ORAL_CAPSULE | Freq: Every day | ORAL | Status: DC
  Administered 2016-08-29 – 2016-08-30 (×2): 60 mg via ORAL
  Filled 2016-08-28 (×2): qty 1

## 2016-08-28 MED ORDER — DEXTROSE 50 % IV SOLN
50.0000 mL | Freq: Once | INTRAVENOUS | Status: AC
  Administered 2016-08-28: 50 mL via INTRAVENOUS
  Filled 2016-08-28: qty 50

## 2016-08-28 MED ORDER — SODIUM POLYSTYRENE SULFONATE 15 GM/60ML PO SUSP
30.0000 g | Freq: Once | ORAL | Status: AC
  Administered 2016-08-28: 30 g via ORAL
  Filled 2016-08-28: qty 120

## 2016-08-28 MED ORDER — HYDRALAZINE HCL 20 MG/ML IJ SOLN
5.0000 mg | INTRAMUSCULAR | Status: DC | PRN
  Filled 2016-08-28: qty 0.25

## 2016-08-28 MED ORDER — ALBUTEROL SULFATE (2.5 MG/3ML) 0.083% IN NEBU
10.0000 mg | INHALATION_SOLUTION | RESPIRATORY_TRACT | Status: DC | PRN
  Administered 2016-08-29: 2.5 mg via RESPIRATORY_TRACT
  Filled 2016-08-28: qty 12

## 2016-08-28 MED ORDER — ZOLPIDEM TARTRATE 5 MG PO TABS
5.0000 mg | ORAL_TABLET | Freq: Every evening | ORAL | Status: DC | PRN
Start: 2016-08-28 — End: 2016-08-30
  Administered 2016-08-28 – 2016-08-29 (×2): 5 mg via ORAL
  Filled 2016-08-28 (×2): qty 1

## 2016-08-28 MED ORDER — DICLOFENAC SODIUM 1 % TD GEL
2.0000 g | Freq: Four times a day (QID) | TRANSDERMAL | Status: DC
  Administered 2016-08-29 – 2016-08-30 (×4): 2 g via TOPICAL
  Filled 2016-08-28: qty 100

## 2016-08-28 NOTE — ED Triage Notes (Addendum)
Per EMS, pt is coming from home with complaints of left knee pain. Pt broke her patella approx. 1 month ago. Pt has been receiving physical therapy ever since the injury. Pt stated that she attempted to go up the stairs alone the pain was severe. Physical therapy arrived after EMS and stated that the knee brace was not in the correct position. Pt AO x4.

## 2016-08-28 NOTE — ED Provider Notes (Signed)
[facility] DEPT Provider Note   CSN: [id number] Arrival date & time: 08/28/16  1444     History   Chief Complaint Chief Complaint  Patient presents with  . Knee Pain    HPI ANIQUA Eaton is a 66 y.o. female.  HPI Patient has spontaneous patellar fracture in May. She has been recovering. She wears a knee immobilizer. She has been doing steps with her physical therapist. Today she was trying to do some steps with her husband. She felt like something shifted and the knee seemed to be slipping. She reports it then seemed more painful and unstable. As an aside, we did review the patient's last admission and hospitalization. She was admitted for hyperkalemia. Although patient has no symptoms she is again taking potassium supplement and did not seem to be aware of her hyperkalemia associated with her prior admission. Past Medical History:  Diagnosis Date  . Anemia   . Asthma   . Chronic headache   . DOE (dyspnea on exertion)    2D ECHO, 02/12/2012 - EF 60-65%, moderate concentric hypertrophy  . Fibromyalgia    nerve pain"left side at waist level" "can't lay on that side without pain" , "HOB elevation helps"  . Heart murmur   . Hematuria - cause not known   . History of kidney stones    x 2 '13, '14 surgery to remove  . Hypertension   . SBO (small bowel obstruction) ([place]) 06/07/2013  . SOB (shortness of breath)    NUCLEAR STRESS TEST, 03/02/2009 - no ischemic ST changes or symptoms  . Thyroid disease    "goiter"  . Transfusion history    10 yrs+  . Urinary incontinence, functional     Patient Active Problem List   Diagnosis Date Noted  . Hyperkalemia 07/07/2016  . Patellar fracture 07/07/2016  . Chronic combined systolic (congestive) and diastolic (congestive) heart failure ([place]) 07/07/2016  . Head trauma 07/07/2016  . Fall at home, initial encounter 07/07/2016  . Sepsis ([place])   . Chronic pain syndrome   . Fibromyalgia   . Asthma   . Accidental drug overdose   .  Hypokalemia   . Acute on chronic combined systolic and diastolic CHF (congestive heart failure) ([place])   . Dysphagia   . Atrial fibrillation ([place]) 04/28/2016  . Elevated troponin 04/28/2016  . Aspiration pneumonia ([place]) 04/28/2016  . Vocal cord dysfunction 04/28/2016  . Non-STEMI (non-ST elevated myocardial infarction) ([place]) 04/28/2016  . Thrombocytopenia ([place]) 04/28/2016  . Seizures ([place])   . Encounter for central line placement   . Toxic metabolic encephalopathy 55/73/2202  . Pressure injury of skin 03/11/2016  . Altered mental status 03/10/2016  . Contusion of other part of head   . Opioid overdose   . Leg swelling 10/19/2015  . Acute on chronic respiratory failure with hypoxemia ([place]) 08/03/2015  . Bilateral leg edema 05/11/2015  . Essential hypertension 05/11/2015  . UTI (lower urinary tract infection) 06/07/2013  . Shortness of breath 01/08/2013  . Pulmonary hypertension ([place]) 01/08/2013  . Anemia of chronic disease 03/28/2011  . Asthma 09/22/2010  . Morbid obesity due to excess calories ([place]) 09/22/2010  . LOW BACK PAIN, ACUTE 04/11/2008  . TROCHANTERIC BURSITIS, LEFT 04/11/2008    Past Surgical History:  Procedure Laterality Date  . CARDIAC CATHETERIZATION  04/04/2010   No significant obstructive coronary artery disease  . CHOLECYSTECTOMY  1990  . COLONOSCOPY W/ POLYPECTOMY    . COLONOSCOPY WITH PROPOFOL N/A 04/10/2014   Procedure: COLONOSCOPY WITH  PROPOFOL;  Surgeon: Beryle Beams, MD;  Location: [facility];  Service: Endoscopy;  Laterality: N/A;  . DIAGNOSTIC LAPAROSCOPY     x2 bowel obstructions(adhesions)  . 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 THYROIDECTOMY    . TUBAL LIGATION  1986    OB History    No data available       Home Medications    Prior to Admission medications   Medication Sig Start Date End Date Taking? Authorizing Provider  acetaminophen (TYLENOL) 650 MG CR tablet Take 650 mg by mouth every  6 (six) hours. 6A, 12P, 6P, 12A   Yes [provider]  aspirin EC 81 MG EC tablet Take 1 tablet (81 mg total) by mouth daily. 05/03/16  Yes Rama, Venetia Maxon, MD  carvedilol (COREG) 6.25 MG tablet Take 6.25 mg by mouth 2 (two) times daily with a meal.   Yes [provider]  cyclobenzaprine (FLEXERIL) 10 MG tablet Take 1 tablet by mouth 3 (three) times daily. 04/20/16  Yes [provider]  diclofenac sodium (VOLTAREN) 1 % GEL Apply 2 g topically every 6 (six) hours. 07/13/16  Yes Aline August, MD  DULoxetine (CYMBALTA) 60 MG capsule TAKE 1 CAPSULE BY MOUTH ONCE DAILY 08/21/16  Yes [provider]  Eszopiclone 3 MG TABS TAKE 1 TABLET BY MOUTH NIGHTLY IMMEDIATELY BEFORE BEDTIME 08/17/16  Yes [provider]  furosemide (LASIX) 20 MG tablet TAKE 1 TABLET (20 MG TOTAL) BY MOUTH DAILY. 06/28/16  Yes Hilty, Nadean Corwin, MD  levothyroxine (SYNTHROID, LEVOTHROID) 150 MCG tablet Take 300 mcg by mouth daily before breakfast.  08/18/16  Yes [provider]  lisinopril (PRINIVIL,ZESTRIL) 2.5 MG tablet Take 1 tablet (2.5 mg total) by mouth daily. 06/06/16 09/04/16 Yes Hilty, Nadean Corwin, MD  LYRICA 200 MG capsule Take 200 mg by mouth 3 (three) times daily. 08/09/16  Yes [provider]  mirabegron ER (MYRBETRIQ) 50 MG TB24 tablet Take 50 mg by mouth daily.   Yes [provider]  potassium chloride SA (K-DUR,KLOR-CON) 20 MEQ tablet Take 1 tablet (20 mEq total) by mouth 2 (two) times daily. Patient taking differently: Take 20 mEq by mouth daily.  05/02/16  Yes Rama, Venetia Maxon, MD  budesonide-formoterol (SYMBICORT) 160-4.5 MCG/ACT inhaler Inhale 2 puffs into the lungs 2 (two) times daily as needed (shortness of breath.).     Tanda Rockers, MD  ibuprofen (ADVIL,MOTRIN) 600 MG tablet Take 1 tablet (600 mg total) by mouth every 6 (six) hours as needed for moderate pain. Patient not taking: Reported on 08/28/2016 07/13/16   Aline August, MD  levalbuterol  Penne Lash) 0.63 MG/3ML nebulizer solution Take 3 mLs (0.63 mg total) by nebulization every 4 (four) hours as needed for wheezing or shortness of breath. 05/02/16   Rama, Venetia Maxon, MD  magnesium hydroxide (MILK OF MAGNESIA) 400 MG/5ML suspension Take 30 mLs by mouth daily as needed for mild constipation. If no BM in 3 days    [provider]  metoprolol succinate (TOPROL-XL) 25 MG 24 hr tablet Take 1 tablet (25 mg total) by mouth daily. Patient not taking: Reported on 08/28/2016 07/14/16   Aline August, MD  OXYGEN Inhale 2 L/min into the lungs continuous.    [provider]  pantoprazole (PROTONIX) 40 MG tablet Take 1 tablet (40 mg total) by mouth at bedtime. Patient not taking: Reported on 08/28/2016 03/12/16   Lavina Hamman, MD  Vitamin D, Ergocalciferol, (  DRISDOL) 50000 units CAPS capsule Take 50,000 Units by mouth every 7 (seven) days.    [provider]    Family History Family History  Problem Relation Age of Onset  . Diabetes Mother   . Epilepsy Mother   . Cancer Mother        Breast  . Hypertension Mother   . Breast cancer Mother   . Kidney disease Father   . Diabetes Father   . Hypertension Father   . Asthma Father   . Heart disease Father   . Epilepsy Sister   . Cancer Maternal Grandmother   . Breast cancer Maternal Grandmother   . Cancer Paternal Grandmother   . Breast cancer Paternal Grandmother     Social History Social History  Substance Use Topics  . Smoking status: Never Smoker  . Smokeless tobacco: Never Used  . Alcohol use No     Comment: wine occ     Allergies   Morphine and related and Penicillins   Review of Systems Review of Systems 10 Systems reviewed and are negative for acute change except as noted in the HPI.   Physical Exam Updated Vital Signs BP (!) 145/65   Pulse 79   Temp 98.1 F (36.7 C) (Oral)   Resp 13   SpO2 100%   Physical Exam  Constitutional: She is oriented to person, place, and time.  Patient  is alert and nontoxic. No respiratory distress. Clinically well and appearance. Morbid obesity.  HENT:  Head: Normocephalic and atraumatic.  Eyes: EOM are normal.  Cardiovascular: Normal rate, regular rhythm and normal heart sounds.   Pulmonary/Chest: Effort normal and breath sounds normal.  Musculoskeletal: She exhibits no edema, tenderness or deformity.  Patient does have significant obesity of the legs. Knees however symmetric. There is no joint effusion present on the left. There is no erythema. No apparent alignment abnormality.  Neurological: She is alert and oriented to person, place, and time. She exhibits normal muscle tone. Coordination normal.  Skin: Skin is warm and dry.  Psychiatric: She has a normal mood and affect.     ED Treatments / Results  Labs (all labs ordered are listed, but only abnormal results are displayed) Labs Reviewed  BASIC METABOLIC PANEL - Abnormal; Notable for the following:       Result Value   Potassium 6.0 (*)    Chloride 112 (*)    BUN 28 (*)    Calcium 7.6 (*)    All other components within normal limits  CBC WITH DIFFERENTIAL/PLATELET - Abnormal; Notable for the following:    RBC 3.50 (*)    Hemoglobin 10.7 (*)    HCT 34.0 (*)    Platelets 136 (*)    All other components within normal limits  Alphonzo Lemmings, ED    EKG  EKG Interpretation  Date/Time:  Monday August 28 2016 17:44:37 EDT Ventricular Rate:  75 PR Interval:    QRS Duration: 81 QT Interval:  381 QTC Calculation: 426 R Axis:   -22 Text Interpretation:  Sinus rhythm Left ventricular hypertrophy Inferior infarct, age indeterminate Lateral leads are also involved peaked T waves and inferior t wave inversion compared to old Confirmed by Charlesetta Shanks [phone]) on 08/28/2016 5:55:57 PM       Radiology Dg Chest 1 View  Result Date: 08/28/2016 CLINICAL DATA:  Coughing for the past 2 weeks. Hypertension. Asthma. EXAM: CHEST 1 VIEW COMPARISON:  08/18/2016.  FINDINGS: Cardiomegaly. Aortic tortuosity. Enlargement of the central pulmonary  arteries. Moderate vascular congestion versus early pulmonary edema. Low lung volumes also noted. No effusion or pneumothorax. IMPRESSION: Cardiomegaly. Moderate vascular congestion versus early pulmonary edema. Worsening aeration from priors. Electronically Signed   By: Staci Righter M.D.   On: 08/28/2016 18:43   Dg Knee 4 Views W/patella Left  Result Date: 08/28/2016 CLINICAL DATA:  Pt fell, fx to patella x 2 months ago, treated with pt and brace, awaiting wt loss and total joint replacement, states she was walking up stairs today and felt kneecap "move" and had new onset pain. EXAM: LEFT KNEE - COMPLETE 4+ VIEW COMPARISON:  07/07/2016 FINDINGS: There is significant joint space narrowing involving the medial, lateral, and patellofemoral compartments. Again noted is patellar fracture without significant change in alignment. IMPRESSION: No significant change in alignment a patellar fracture. Significant tricompartmental degenerative changes. Electronically Signed   By: Nolon Nations M.D.   On: 08/28/2016 17:19    Procedures Procedures (including critical care time) CRITICAL CARE Performed by: Charlesetta Shanks   Total critical care time: 45 minutes  Critical care time was exclusive of separately billable procedures and treating other patients.  Critical care was necessary to treat or prevent imminent or life-threatening deterioration.  Critical care was time spent personally by me on the following activities: development of treatment plan with patient and/or surrogate as well as nursing, discussions with consultants, evaluation of patient's response to treatment, examination of patient, obtaining history from patient or surrogate, ordering and performing treatments and interventions, ordering and review of laboratory studies, ordering and review of radiographic studies, pulse oximetry and re-evaluation of patient's  condition.  Medications Ordered in ED Medications  calcium gluconate 1 g in sodium chloride 0.9 % 100 mL IVPB (1 g Intravenous New Bag/Given 08/28/16 2028)  sodium polystyrene (KAYEXALATE) 15 GM/60ML suspension 30 g (30 g Oral Given 08/28/16 1800)  furosemide (LASIX) injection 40 mg (40 mg Intravenous Given 08/28/16 2014)  HYDROcodone-acetaminophen (NORCO/VICODIN) 5-325 MG per tablet 1 tablet (1 tablet Oral Given 08/28/16 1800)  albuterol (PROVENTIL) (2.5 MG/3ML) 0.083% nebulizer solution 10 mg (10 mg Nebulization Given 08/28/16 1848)     Initial Impression / Assessment and Plan / ED Course  I have reviewed the triage vital signs and the nursing notes.  Pertinent labs & imaging results that were available during my care of the patient were reviewed by me and considered in my medical decision making (see chart for details).     Consult: [facility] hospitalist formation. Final Clinical Impressions(s) / ED Diagnoses   Final diagnoses:  Knee pain  Hyperkalemia  Acute on chronic systolic congestive heart failure [facility])  Patient presented with a chief concern for her previously fractured patella. There are no acute findings regarding her previous patellar injury. It was however noted during review of systems that the patient has been continuing to take potassium although she had to be admitted for hyperkalemia last month. I did opt to recheck a potassium which turned out to be elevated. EKG also had changes consistent with hyperkalemia of peaking T waves. Inferiorly patient exhibits inversions. Patient denied that she has had any chest pain. She reports she is chronically short of breath and did not note that was any worse than what she considers her baseline. Chest x-ray however shows increasing vascular congestion. At this time, patient is treated for hyperkalemia and acute CHF exacerbation. Plan will be for admission.  New Prescriptions New Prescriptions   No medications on file     Charlesetta Shanks,  MD 08/28/16 2031

## 2016-08-28 NOTE — H&P (Signed)
History and Physical    Jillian Eaton RJJ:[id number] DOB: 04/24/1950 DOA: 08/28/2016  Referring MD/NP/PA:   PCP: Red Christians, MD   Patient coming from:  The patient is coming from home.  At baseline, pt is independent for most of ADL.  Chief Complaint: left knee pain  HPI: Jillian Eaton is a 66 y.o. female with medical history significant of morbid obesity, hypertension, asthma, GERD, hypothyroidism, depression, fibromyalgia, anemia, urinary incontinence, dCHF, morbid obesity, who presents with left knee pain.  Pt states that she broke her left patella approx. 1 month ago. Pt has kneed brace placed and has been receiving physical therapy ever since the injury. Pt stated that she attempted to go up the stairs alone, and her left knee pain has worsened. It is constant, 10 out of 10 in severity at home, nonradiating. It is aggravated when walking. No new injury. Patient reports mild shortness of breath and a mild dry cough, but no chest pain. No fever or chills. Denies nausea, vomiting, diarrhea, abdominal pain, diarrhea, symptoms of UTI or unilateral weakness.  ED Course: pt was found to have hyperkalemia with potassium 6.0 and T wave thickening in V4-V6, WBC 6.1, INR 1.14, negative troponin, creatinine normal, temperature normal, no tachycardia, oxygen saturation 87 to 100% on room air. X-ray of left knee in negative for acute bony abnormalities. Pt is placed on telemetry bed for observation.  Review of Systems:   General: no fevers, chills, no changes in body weight, has fatigue HEENT: no blurry vision, hearing changes or sore throat Respiratory: has mild dyspnea, coughing, no wheezing CV: no chest pain, no palpitations GI: no nausea, vomiting, abdominal pain, diarrhea, constipation GU: no dysuria, burning on urination, increased urinary frequency, hematuria  Ext: has mild leg edema Neuro: no unilateral weakness, numbness, or tingling, no vision change or hearing loss Skin: no  rash, no skin tear. MSK: has left knee pain. Heme: No easy bruising.  Travel history: No recent long distant travel.  Allergy:  Allergies  Allergen Reactions  . Morphine And Related Other (See Comments)    Respiratory suppression, respiratory failure when given IV  . Penicillins Other (See Comments)    intolerance    Past Medical History:  Diagnosis Date  . Anemia   . Asthma   . Chronic headache   . DOE (dyspnea on exertion)    2D ECHO, 02/12/2012 - EF 60-65%, moderate concentric hypertrophy  . Fibromyalgia    nerve pain"left side at waist level" "can't lay on that side without pain" , "HOB elevation helps"  . Heart murmur   . Hematuria - cause not known   . History of kidney stones    x 2 '13, '14 surgery to remove  . Hypertension   . SBO (small bowel obstruction) ([place]) 06/07/2013  . SOB (shortness of breath)    NUCLEAR STRESS TEST, 03/02/2009 - no ischemic ST changes or symptoms  . Thyroid disease    "goiter"  . Transfusion history    10 yrs+  . Urinary incontinence, functional     Past Surgical History:  Procedure Laterality Date  . CARDIAC CATHETERIZATION  04/04/2010   No significant obstructive coronary artery disease  . CHOLECYSTECTOMY  1990  . COLONOSCOPY W/ POLYPECTOMY    . COLONOSCOPY WITH PROPOFOL N/A 04/10/2014   Procedure: COLONOSCOPY WITH PROPOFOL;  Surgeon: Beryle Beams, MD;  Location: [facility];  Service: Endoscopy;  Laterality: N/A;  . DIAGNOSTIC LAPAROSCOPY     x2 bowel obstructions(adhesions)  .  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 THYROIDECTOMY    . TUBAL LIGATION  1986    Social History:  reports that she has never smoked. She has never used smokeless tobacco. She reports that she does not drink alcohol or use drugs.  Family History:  Family History  Problem Relation Age of Onset  . Diabetes Mother   . Epilepsy Mother   . Cancer Mother        Breast  . Hypertension Mother   . Breast cancer  Mother   . Kidney disease Father   . Diabetes Father   . Hypertension Father   . Asthma Father   . Heart disease Father   . Epilepsy Sister   . Cancer Maternal Grandmother   . Breast cancer Maternal Grandmother   . Cancer Paternal Grandmother   . Breast cancer Paternal Grandmother      Prior to Admission medications   Medication Sig Start Date End Date Taking? Authorizing Provider  acetaminophen (TYLENOL) 650 MG CR tablet Take 650 mg by mouth every 6 (six) hours. 6A, 12P, 6P, 12A   Yes [provider]  aspirin EC 81 MG EC tablet Take 1 tablet (81 mg total) by mouth daily. 05/03/16  Yes Rama, Venetia Maxon, MD  carvedilol (COREG) 6.25 MG tablet Take 6.25 mg by mouth 2 (two) times daily with a meal.   Yes [provider]  cyclobenzaprine (FLEXERIL) 10 MG tablet Take 1 tablet by mouth 3 (three) times daily. 04/20/16  Yes [provider]  diclofenac sodium (VOLTAREN) 1 % GEL Apply 2 g topically every 6 (six) hours. 07/13/16  Yes Aline August, MD  DULoxetine (CYMBALTA) 60 MG capsule TAKE 1 CAPSULE BY MOUTH ONCE DAILY 08/21/16  Yes [provider]  Eszopiclone 3 MG TABS TAKE 1 TABLET BY MOUTH NIGHTLY IMMEDIATELY BEFORE BEDTIME 08/17/16  Yes [provider]  furosemide (LASIX) 20 MG tablet TAKE 1 TABLET (20 MG TOTAL) BY MOUTH DAILY. 06/28/16  Yes Hilty, Nadean Corwin, MD  levothyroxine (SYNTHROID, LEVOTHROID) 150 MCG tablet Take 300 mcg by mouth daily before breakfast.  08/18/16  Yes [provider]  lisinopril (PRINIVIL,ZESTRIL) 2.5 MG tablet Take 1 tablet (2.5 mg total) by mouth daily. 06/06/16 09/04/16 Yes Hilty, Nadean Corwin, MD  LYRICA 200 MG capsule Take 200 mg by mouth 3 (three) times daily. 08/09/16  Yes [provider]  mirabegron ER (MYRBETRIQ) 50 MG TB24 tablet Take 50 mg by mouth daily.   Yes [provider]  potassium chloride SA (K-DUR,KLOR-CON) 20 MEQ tablet Take 1 tablet (20 mEq total) by mouth 2 (two) times daily. Patient  taking differently: Take 20 mEq by mouth daily.  05/02/16  Yes Rama, Venetia Maxon, MD  budesonide-formoterol (SYMBICORT) 160-4.5 MCG/ACT inhaler Inhale 2 puffs into the lungs 2 (two) times daily as needed (shortness of breath.).     Tanda Rockers, MD  ibuprofen (ADVIL,MOTRIN) 600 MG tablet Take 1 tablet (600 mg total) by mouth every 6 (six) hours as needed for moderate pain. Patient not taking: Reported on 08/28/2016 07/13/16   Aline August, MD  levalbuterol Penne Lash) 0.63 MG/3ML nebulizer solution Take 3 mLs (0.63 mg total) by nebulization every 4 (four) hours as needed for wheezing or shortness of breath. 05/02/16   Rama, Venetia Maxon, MD  magnesium hydroxide (MILK OF MAGNESIA) 400 MG/5ML suspension Take 30 mLs by mouth daily as needed for mild constipation. If no BM in 3 days  [provider]  metoprolol succinate (TOPROL-XL) 25 MG 24 hr tablet Take 1 tablet (25 mg total) by mouth daily. Patient not taking: Reported on 08/28/2016 07/14/16   Aline August, MD  OXYGEN Inhale 2 L/min into the lungs continuous.    [provider]  pantoprazole (PROTONIX) 40 MG tablet Take 1 tablet (40 mg total) by mouth at bedtime. Patient not taking: Reported on 08/28/2016 03/12/16   Lavina Hamman, MD  Vitamin D, Ergocalciferol, (DRISDOL) 50000 units CAPS capsule Take 50,000 Units by mouth every 7 (seven) days.    [provider]    Physical Exam: Vitals:   08/28/16 1454 08/28/16 1857 08/28/16 1908 08/28/16 2204  BP: (!) 145/65   (!) 183/107  Pulse: 80  79 82  Resp: 15  13 18   Temp: 98.1 F (36.7 C)     TempSrc: Oral     SpO2: (!) 87% 100% 100% 90%   General: Not in acute distress HEENT:       Eyes: PERRL, EOMI, no scleral icterus.       ENT: No discharge from the ears and nose, no pharynx injection, no tonsillar enlargement.        Neck: Difficult to assess JVD due to morbid obesity. no bruit, no mass felt. Heme: No neck lymph node enlargement. Cardiac: S1/S2, RRR, has 2/6  systolic murmurs, No gallops or rubs. Respiratory: No rales, wheezing, rhonchi or rubs. GI: Soft, nondistended, nontender, no rebound pain, no organomegaly, BS present. GU: No hematuria Ext: has trace pitting leg edema bilaterally. 2+DP/PT pulse bilaterally. Musculoskeletal: has left kneed tenderness, has left knee brace in place. Skin: No rashes.  Neuro: Alert, oriented X3, cranial nerves II-XII grossly intact, moves all extremities normally.  Psych: Patient is not psychotic, no suicidal or hemocidal ideation.  Labs on Admission: I have personally reviewed following labs and imaging studies  CBC:  Recent Labs Lab 08/28/16 2000  WBC 6.5  NEUTROABS 3.9  HGB 10.7*  HCT 34.0*  MCV 97.1  PLT 329*   Basic Metabolic Panel:  Recent Labs Lab 08/28/16 1558  NA 145  K 6.0*  CL 112*  CO2 25  GLUCOSE 79  BUN 28*  CREATININE 0.96  CALCIUM 7.6*   GFR: CrCl cannot be calculated (Unknown ideal weight.). Liver Function Tests: No results for input(s): AST, ALT, ALKPHOS, BILITOT, PROT, ALBUMIN in the last 168 hours. No results for input(s): LIPASE, AMYLASE in the last 168 hours. No results for input(s): AMMONIA in the last 168 hours. Coagulation Profile:  Recent Labs Lab 08/28/16 2000  INR 1.14   Cardiac Enzymes: No results for input(s): CKTOTAL, CKMB, CKMBINDEX, TROPONINI in the last 168 hours. BNP (last 3 results) No results for input(s): PROBNP in the last 8760 hours. HbA1C: No results for input(s): HGBA1C in the last 72 hours. CBG: No results for input(s): GLUCAP in the last 168 hours. Lipid Profile: No results for input(s): CHOL, HDL, LDLCALC, TRIG, CHOLHDL, LDLDIRECT in the last 72 hours. Thyroid Function Tests: No results for input(s): TSH, T4TOTAL, FREET4, T3FREE, THYROIDAB in the last 72 hours. Anemia Panel: No results for input(s): VITAMINB12, FOLATE, FERRITIN, TIBC, IRON, RETICCTPCT in the last 72 hours. Urine analysis:    Component Value Date/Time    COLORURINE AMBER (A) 04/23/2016 2359   APPEARANCEUR CLOUDY (A) 04/23/2016 2359   LABSPEC 1.030 04/23/2016 2359   PHURINE 5.0 04/23/2016 2359   GLUCOSEU 50 (A) 04/23/2016 2359   HGBUR MODERATE (A) 04/23/2016 2359   BILIRUBINUR SMALL (A)  04/23/2016 2359   KETONESUR 5 (A) 04/23/2016 2359   PROTEINUR >=300 (A) 04/23/2016 2359   UROBILINOGEN 1.0 06/06/2013 2154   NITRITE NEGATIVE 04/23/2016 2359   LEUKOCYTESUR NEGATIVE 04/23/2016 2359   Sepsis Labs: @LABRCNTIP (procalcitonin:4,lacticidven:4) )No results found for this or any previous visit (from the past 240 hour(s)).   Radiological Exams on Admission: Dg Chest 1 View  Result Date: 08/28/2016 CLINICAL DATA:  Coughing for the past 2 weeks. Hypertension. Asthma. EXAM: CHEST 1 VIEW COMPARISON:  08/18/2016. FINDINGS: Cardiomegaly. Aortic tortuosity. Enlargement of the central pulmonary arteries. Moderate vascular congestion versus early pulmonary edema. Low lung volumes also noted. No effusion or pneumothorax. IMPRESSION: Cardiomegaly. Moderate vascular congestion versus early pulmonary edema. Worsening aeration from priors. Electronically Signed   By: Staci Righter M.D.   On: 08/28/2016 18:43   Dg Knee 4 Views W/patella Left  Result Date: 08/28/2016 CLINICAL DATA:  Pt fell, fx to patella x 2 months ago, treated with pt and brace, awaiting wt loss and total joint replacement, states she was walking up stairs today and felt kneecap "move" and had new onset pain. EXAM: LEFT KNEE - COMPLETE 4+ VIEW COMPARISON:  07/07/2016 FINDINGS: There is significant joint space narrowing involving the medial, lateral, and patellofemoral compartments. Again noted is patellar fracture without significant change in alignment. IMPRESSION: No significant change in alignment a patellar fracture. Significant tricompartmental degenerative changes. Electronically Signed   By: Nolon Nations M.D.   On: 08/28/2016 17:19     EKG: Independently reviewed.  Sinus rhythm, QTC 426,  T-wave inversion in lead 3/aVF and V1-V2, T-wave peaking in V4-V6.      Assessment/Plan Principal Problem:   Hyperkalemia Active Problems:   Anemia of chronic disease   Essential hypertension   Asthma   Chronic diastolic CHF (congestive heart failure) (HCC)   GERD (gastroesophageal reflux disease)   Hypothyroidism   Depression   Left knee pain   Hypocalcemia   Hyperkalemia: K=6.0 with T-wave peaking in EKG. -will place on tele bed for obs -IV NovoLog 5 units 1 and D50 -Kayexalate 30 g 1 -Calcium gluconate 1 g -hold KCl and lisinopril -follow-up by BMP  Essential hypertension: -Hold her lisinopril due to hyperkalemia -continue coreg  - on lasix  -Start amlodipine 5 mg daily - IV hydralazine when necessary  Anemia of chronic disease: hgb 10.7 -f/u by CBC  Chronic diastolic CHF (congestive heart failure): 2-D echo on 07/09/78 showed EF 60-75% with grade 1 diastolic dysfunction. Patient does not have leg edema. CHF seems to be compensated. -Continue aspirin, Coreg and home dose of Lasix, 20 mg daily (patient received one dose of Lasix 40 mg IV in ED).  Asthma: Stable. No wheezing on auscultation. -Albuterol nebulizers when necessary -Dulera inhaler  GERD: -Protonix  Hypothyroidism: Last TSH was 9.239 on 04/25/16 -Continue home Synthroid -Check TSH  Hypocalcemia: Ca 7.6 -Calcium gluconate 1 g -pt is on Dd 50,000 every week   Left knee pain: no acute new issues by X-ray -prn Norco  Depression Stable, no suicidal or homicidal ideations. -Continue home medications    DVT ppx:  SQ Lovenox Code Status: Full code Family Communication: None at bed side.    Disposition Plan:  Anticipate discharge back to previous home environment Consults called:  none Admission status: Obs / tele    Date of Service 08/28/2016    Ivor Costa [facility] Pager [phone]  If 7PM-7AM, please contact night-coverage [email or web address] Password [email or web address] 08/28/2016, 10:14 PM

## 2016-08-29 ENCOUNTER — Observation Stay (HOSPITAL_COMMUNITY): Payer: Federal, State, Local not specified - PPO

## 2016-08-29 DIAGNOSIS — M25562 Pain in left knee: Secondary | ICD-10-CM | POA: Diagnosis not present

## 2016-08-29 LAB — BASIC METABOLIC PANEL
ANION GAP: 9 (ref 5–15)
BUN: 26 mg/dL — AB (ref 6–20)
CO2: 28 mmol/L (ref 22–32)
Calcium: 7.7 mg/dL — ABNORMAL LOW (ref 8.9–10.3)
Chloride: 110 mmol/L (ref 101–111)
Creatinine, Ser: 0.98 mg/dL (ref 0.44–1.00)
GFR, EST NON AFRICAN AMERICAN: 60 mL/min — AB (ref >60)
Glucose, Bld: 90 mg/dL (ref 65–99)
POTASSIUM: 4.8 mmol/L (ref 3.5–5.1)
SODIUM: 147 mmol/L — AB (ref 135–145)

## 2016-08-29 LAB — CBC
HEMATOCRIT: 31.5 % — AB (ref 36.0–46.0)
HEMOGLOBIN: 9.9 g/dL — AB (ref 12.0–15.0)
MCH: 31 pg (ref 26.0–34.0)
MCHC: 31.4 g/dL (ref 30.0–36.0)
MCV: 98.7 fL (ref 78.0–100.0)
Platelets: 132 10*3/uL — ABNORMAL LOW (ref 150–400)
RBC: 3.19 MIL/uL — AB (ref 3.87–5.11)
RDW: 14.7 % (ref 11.5–15.5)
WBC: 5.4 10*3/uL (ref 4.0–10.5)

## 2016-08-29 LAB — T4, FREE: FREE T4: 1.04 ng/dL (ref 0.61–1.12)

## 2016-08-29 LAB — TSH: TSH: 0.046 u[IU]/mL — ABNORMAL LOW (ref 0.350–4.500)

## 2016-08-29 MED ORDER — CHLORHEXIDINE GLUCONATE 0.12 % MT SOLN
15.0000 mL | Freq: Two times a day (BID) | OROMUCOSAL | Status: DC
  Administered 2016-08-29 – 2016-08-30 (×2): 15 mL via OROMUCOSAL
  Filled 2016-08-29 (×2): qty 15

## 2016-08-29 MED ORDER — ENOXAPARIN SODIUM 80 MG/0.8ML ~~LOC~~ SOLN
70.0000 mg | Freq: Every day | SUBCUTANEOUS | Status: DC
  Administered 2016-08-29: 70 mg via SUBCUTANEOUS
  Filled 2016-08-29: qty 0.8

## 2016-08-29 MED ORDER — HYDROCODONE-ACETAMINOPHEN 5-325 MG PO TABS
1.0000 | ORAL_TABLET | Freq: Four times a day (QID) | ORAL | Status: DC | PRN
  Administered 2016-08-29: 1 via ORAL
  Filled 2016-08-29: qty 1

## 2016-08-29 MED ORDER — PANTOPRAZOLE SODIUM 40 MG PO TBEC
40.0000 mg | DELAYED_RELEASE_TABLET | Freq: Every day | ORAL | Status: DC
  Administered 2016-08-29: 40 mg via ORAL
  Filled 2016-08-29: qty 1

## 2016-08-29 MED ORDER — ORAL CARE MOUTH RINSE
15.0000 mL | Freq: Two times a day (BID) | OROMUCOSAL | Status: DC
  Administered 2016-08-29: 15 mL via OROMUCOSAL

## 2016-08-29 NOTE — Care Management Note (Signed)
Case Management Note  Patient Details  Name: Jillian Eaton MRN: [id number] Date of Birth: 12-31-1950  Subjective/Objective:66 y/o f admitted w/Hyperkalemia. From home. Active w/[facility] HHPT/OT-spoke to rep Courtney-faxed w/confirmation HHPT/OT orders. Await d/c order. Has home -02 w/[facility]. No further CM needs.                    Action/Plan:d/c home w/HHC.   Expected Discharge Date:   (unknown)               Expected Discharge Plan:  [place]  In-House Referral:     Discharge planning Services  CM Consult  Post Acute Care Choice:  Home Health (Active w/[facility] HHPT/OT) Choice offered to:     DME Arranged:    DME Agency:     HH Arranged:  PT, OT HH Agency:  [facility]  Status of Service:  Completed, signed off  If discussed at [place] of Stay Meetings, dates discussed:    Additional Comments:  Dessa Phi, RN 08/29/2016, 3:43 PM

## 2016-08-29 NOTE — Progress Notes (Signed)
PROGRESS NOTE    Jillian Eaton  JQZ:[id number] DOB: [birth year] DOA: 08/28/2016 PCP: Red Christians, MD     Brief Narrative:  Jillian Eaton is a 66 y.o. female with medical history significant of morbid obesity, hypertension, asthma, GERD, hypothyroidism, depression, fibromyalgia, anemia, urinary incontinence, dCHF, morbid obesity, who presents with left knee pain. She broke her left patella approx. 1 month ago. Pt has knee brace placed and has been receiving physical therapy ever since the injury. Yesterday, she attempted to go up the stairs alone, and her left knee pain has worsened and called EMS. Evaluation in the ED revealed hyperkalemia, left knee xray unremarkable.   Assessment & Plan:   Principal Problem:   Hyperkalemia Active Problems:   Anemia of chronic disease   Essential hypertension   Asthma   Chronic diastolic CHF (congestive heart failure) (HCC)   GERD (gastroesophageal reflux disease)   Hypothyroidism   Depression   Left knee pain   Hypocalcemia   Left knee pain -Recent dx left patella fracture in May, following with Dr. Maureen Ralphs and outpatient PT -Acutely worsened yesterday while walking up stairs, has been able to bear weight in the hospital but apprehensive about going up steps again. Xray left knee without acute abnormalities this admission  -Check MRI knee  -PT to eval  -Caution with pain medications; last admission discharge summary reviewed and due to pain medications, she had respiratory depression and had to be intubated   Hyperkalemia -Improved. Has been taking potassium supplementation daily at home. Will need to stop on discharge as patient also had hyperkalemia last admission as well   Essential hypertension -Hold her lisinopril due to hyperkalemia and start norvasc instead  -Continue coreg   Anemia of chronic disease -Stable, monitor   Chronic diastolic CHF  -2-D echo on 07/09/78 showed EF 60-75% with grade 1 diastolic  dysfunction -Continue aspirin, Coreg and home dose of Lasix 20 mg daily -Stable   GERD -Protonix  Hypothyroidism -TSH 0.046 but free T4 1.04 -Continue synthroid   Depression  -Continue cymbalta   Morbid obesity  Body mass index is 50.1 kg/m.    DVT prophylaxis: lovenox Code Status: full Family Communication: no family at bedside Disposition Plan: pending improvement in electrolytes, MRI findings   Consultants:   none  Procedures:   none  Antimicrobials:  Anti-infectives    None        Subjective: Continues to have left knee pain, difficult to bend   Objective: Vitals:   08/28/16 2300 08/28/16 2307 08/28/16 2311 08/29/16 0508  BP:  (!) 149/93  (!) 109/59  Pulse:  73  77  Resp:  16  18  Temp:  97.6 F (36.4 C)  (!) 97.4 F (36.3 C)  TempSrc:  Oral  Oral  SpO2:  93%  97%  Weight:   (!) 145.3 kg (320 lb 5.3 oz) (!) 145.1 kg (319 lb 14.2 oz)  Height: 5\' 7"  (1.702 m)       Intake/Output Summary (Last 24 hours) at 08/29/16 1125 Last data filed at 08/29/16 0900  Gross per 24 hour  Intake              390 ml  Output                0 ml  Net              390 ml   Filed Weights   08/28/16 2311 08/29/16 0508  Weight: (!) 145.3 kg (320  lb 5.3 oz) (!) 145.1 kg (319 lb 14.2 oz)    Examination:  General exam: Appears calm and comfortable  Respiratory system: Clear to auscultation. Respiratory effort normal. Cardiovascular system: S1 & S2 heard, RRR. No JVD, murmurs, rubs, gallops or clicks. +trace pedal edema. Gastrointestinal system: Abdomen is nondistended, soft and nontender. No organomegaly or masses felt. Normal bowel sounds heard. Central nervous system: Alert and oriented. No focal neurological deficits. Extremities: Symmetric, left knee with pain to palpation medical aspect left knee, unable to test ROM due to pain, body habitus  Skin: No rashes, lesions or ulcers Psychiatry: Judgement and insight appear normal. Mood & affect appropriate.    Data Reviewed: I have personally reviewed following labs and imaging studies  CBC:  Recent Labs Lab 08/28/16 2000 08/29/16 0454  WBC 6.5 5.4  NEUTROABS 3.9  --   HGB 10.7* 9.9*  HCT 34.0* 31.5*  MCV 97.1 98.7  PLT 136* 299*   Basic Metabolic Panel:  Recent Labs Lab 08/28/16 1558 08/29/16 0454  NA 145 147*  K 6.0* 4.8  CL 112* 110  CO2 25 28  GLUCOSE 79 90  BUN 28* 26*  CREATININE 0.96 0.98  CALCIUM 7.6* 7.7*   GFR: Estimated Creatinine Clearance: 87 mL/min (by C-G formula based on SCr of 0.98 mg/dL). Liver Function Tests: No results for input(s): AST, ALT, ALKPHOS, BILITOT, PROT, ALBUMIN in the last 168 hours. No results for input(s): LIPASE, AMYLASE in the last 168 hours. No results for input(s): AMMONIA in the last 168 hours. Coagulation Profile:  Recent Labs Lab 08/28/16 2000  INR 1.14   Cardiac Enzymes: No results for input(s): CKTOTAL, CKMB, CKMBINDEX, TROPONINI in the last 168 hours. BNP (last 3 results) No results for input(s): PROBNP in the last 8760 hours. HbA1C: No results for input(s): HGBA1C in the last 72 hours. CBG:  Recent Labs Lab 08/28/16 2325  GLUCAP 89   Lipid Profile: No results for input(s): CHOL, HDL, LDLCALC, TRIG, CHOLHDL, LDLDIRECT in the last 72 hours. Thyroid Function Tests:  Recent Labs  08/29/16 0450 08/29/16 0454  TSH  --  0.046*  FREET4 1.04  --    Anemia Panel: No results for input(s): VITAMINB12, FOLATE, FERRITIN, TIBC, IRON, RETICCTPCT in the last 72 hours. Sepsis Labs: No results for input(s): PROCALCITON, LATICACIDVEN in the last 168 hours.  No results found for this or any previous visit (from the past 240 hour(s)).     Radiology Studies: Dg Chest 1 View  Result Date: 08/28/2016 CLINICAL DATA:  Coughing for the past 2 weeks. Hypertension. Asthma. EXAM: CHEST 1 VIEW COMPARISON:  08/18/2016. FINDINGS: Cardiomegaly. Aortic tortuosity. Enlargement of the central pulmonary arteries. Moderate vascular  congestion versus early pulmonary edema. Low lung volumes also noted. No effusion or pneumothorax. IMPRESSION: Cardiomegaly. Moderate vascular congestion versus early pulmonary edema. Worsening aeration from priors. Electronically Signed   By: Staci Righter M.D.   On: 08/28/2016 18:43   Dg Knee 4 Views W/patella Left  Result Date: 08/28/2016 CLINICAL DATA:  Pt fell, fx to patella x 2 months ago, treated with pt and brace, awaiting wt loss and total joint replacement, states she was walking up stairs today and felt kneecap "move" and had new onset pain. EXAM: LEFT KNEE - COMPLETE 4+ VIEW COMPARISON:  07/07/2016 FINDINGS: There is significant joint space narrowing involving the medial, lateral, and patellofemoral compartments. Again noted is patellar fracture without significant change in alignment. IMPRESSION: No significant change in alignment a patellar fracture. Significant tricompartmental degenerative changes.  Electronically Signed   By: Nolon Nations M.D.   On: 08/28/2016 17:19      Scheduled Meds: . acetaminophen  650 mg Oral Q6H  . amLODipine  5 mg Oral Daily  . aspirin EC  81 mg Oral Daily  . carvedilol  6.25 mg Oral BID WC  . chlorhexidine  15 mL Mouth Rinse BID  . cyclobenzaprine  10 mg Oral TID  . diclofenac sodium  2 g Topical Q6H  . DULoxetine  60 mg Oral Daily  . enoxaparin (LOVENOX) injection  70 mg Subcutaneous QHS  . furosemide  20 mg Oral Daily  . levothyroxine  300 mcg Oral QAC breakfast  . mouth rinse  15 mL Mouth Rinse q12n4p  . mirabegron ER  50 mg Oral Daily  . mometasone-formoterol  2 puff Inhalation BID  . pantoprazole  40 mg Oral QHS  . pregabalin  200 mg Oral TID   Continuous Infusions:   LOS: 0 days    Time spent: 40 minutes   Dessa Phi, DO [facility] [email or web address] Password [email or web address] 08/29/2016, 11:25 AM

## 2016-08-29 NOTE — Progress Notes (Signed)
PT Cancellation Note  Patient Details Name: Jillian Eaton MRN: [id number] DOB: [birth year]   Cancelled Treatment:    Reason Eval/Treat Not Completed: Order received. Chart reviewed. Per chart, MRI L knee has been ordered. Will hold PT eval until after MRI results. Thanks.    Weston Anna, MPT Pager: [phone]

## 2016-08-30 ENCOUNTER — Other Ambulatory Visit: Payer: Self-pay | Admitting: Internal Medicine

## 2016-08-30 ENCOUNTER — Ambulatory Visit: Payer: Federal, State, Local not specified - PPO | Admitting: Internal Medicine

## 2016-08-30 DIAGNOSIS — M25562 Pain in left knee: Secondary | ICD-10-CM | POA: Diagnosis not present

## 2016-08-30 DIAGNOSIS — E039 Hypothyroidism, unspecified: Secondary | ICD-10-CM | POA: Diagnosis not present

## 2016-08-30 DIAGNOSIS — I5032 Chronic diastolic (congestive) heart failure: Secondary | ICD-10-CM

## 2016-08-30 DIAGNOSIS — G8929 Other chronic pain: Secondary | ICD-10-CM

## 2016-08-30 DIAGNOSIS — F329 Major depressive disorder, single episode, unspecified: Secondary | ICD-10-CM

## 2016-08-30 DIAGNOSIS — K219 Gastro-esophageal reflux disease without esophagitis: Secondary | ICD-10-CM

## 2016-08-30 DIAGNOSIS — I10 Essential (primary) hypertension: Secondary | ICD-10-CM | POA: Diagnosis not present

## 2016-08-30 DIAGNOSIS — J45909 Unspecified asthma, uncomplicated: Secondary | ICD-10-CM | POA: Diagnosis not present

## 2016-08-30 DIAGNOSIS — E875 Hyperkalemia: Secondary | ICD-10-CM | POA: Diagnosis not present

## 2016-08-30 LAB — BASIC METABOLIC PANEL
Anion gap: 7 (ref 5–15)
BUN: 27 mg/dL — AB (ref 6–20)
CALCIUM: 7.1 mg/dL — AB (ref 8.9–10.3)
CHLORIDE: 107 mmol/L (ref 101–111)
CO2: 31 mmol/L (ref 22–32)
CREATININE: 0.87 mg/dL (ref 0.44–1.00)
Glucose, Bld: 86 mg/dL (ref 65–99)
Potassium: 4.6 mmol/L (ref 3.5–5.1)
SODIUM: 145 mmol/L (ref 135–145)

## 2016-08-30 LAB — CBC
HCT: 32.8 % — ABNORMAL LOW (ref 36.0–46.0)
Hemoglobin: 10.4 g/dL — ABNORMAL LOW (ref 12.0–15.0)
MCH: 31.5 pg (ref 26.0–34.0)
MCHC: 31.7 g/dL (ref 30.0–36.0)
MCV: 99.4 fL (ref 78.0–100.0)
PLATELETS: 129 10*3/uL — AB (ref 150–400)
RBC: 3.3 MIL/uL — AB (ref 3.87–5.11)
RDW: 14.4 % (ref 11.5–15.5)
WBC: 4.6 10*3/uL (ref 4.0–10.5)

## 2016-08-30 MED ORDER — AMLODIPINE BESYLATE 2.5 MG PO TABS
2.5000 mg | ORAL_TABLET | Freq: Every day | ORAL | 11 refills | Status: DC
Start: 2016-08-31 — End: 2017-06-19

## 2016-08-30 MED ORDER — AMLODIPINE BESYLATE 5 MG PO TABS: 2.5000 mg | ORAL_TABLET | Freq: Every day | ORAL | Status: DC

## 2016-08-30 NOTE — Discharge Summary (Signed)
Physician Discharge Summary  Jillian Eaton [facility] KGU:[id number] DOB: Jan 28, 1951 DOA: 08/28/2016  PCP: Red Christians, MD  Admit date: 08/28/2016 Discharge date: 08/30/2016  Time spent: 35 minutes  Recommendations for Outpatient Follow-up:  1. Repeat basic metabolic panel to follow electrolytes and renal function 2. Repeat CBC to follow hemoglobin trend and proved stability of patient's chronic anemia 3. Reassess patient's blood pressure and adjust antihypertensive regimen as needed.   Discharge Diagnoses:  Principal Problem:   Hyperkalemia Active Problems:   Anemia of chronic disease   Essential hypertension   Asthma   Chronic diastolic CHF (congestive heart failure) (HCC)   GERD (gastroesophageal reflux disease)   Hypothyroidism   Depression   Left knee pain   Hypocalcemia   Discharge Condition: Stable and improved. Patient has been discharged with home health services for home health PT. Instructed to follow up with her PCP in 1 week.  Diet recommendation: Low-sodium diet and low calorie diet.  Filed Weights   08/28/16 2311 08/29/16 0508 08/30/16 0635  Weight: (!) 145.3 kg (320 lb 5.3 oz) (!) 145.1 kg (319 lb 14.2 oz) (!) 143.8 kg (317 lb 0.3 oz)    History of present illness:  66 year old female with medical history significant of morbid obesity, hypertension, asthma, GERD, hypothyroidism, depression and chronic diastolic heart failure; who presented to the emergency department complaining of worsening left knee pain and some muscular spasm. Patient was found to have hyperkalemia and was place on observation for further evaluation.  Hospital Course:  1-left knee pain  -Most likely associated with recent patella fracture in the setting of acute hyperkalemia, causing muscular spasm.  -Chest x-ray and MRI of her left knee demonstrated no acute abnormalities and provide information of proper healing process recent fracture. -Physical therapy similar to the patient and home  health PT will be resumed at the moment of discharge. -Patient has been advised to follow diet and continue increasing her physical activity.  2-hyperkalemia -Associated to the use of lisinopril and continue use of potassium supplements. -Potassium supplementation has been discontinue her lisinopril has been placed on hold until follow-up with her PCP -At discharge hyperkalemia is resolved. -Recommend repeat a basic metabolic panel to follow electrolytes trend  3-essential hypertension -Stable -Continue coreg -Advised to follow low sodium diet -Low dose amlodipine has been prescribed while her lisinopril is placed on hold. -Advised follow-up of her blood pressure as an outpatient and further adjustment of her antihypertensive regimen to be done as needed.  4-anemia of chronic disease -Stable without signs of acute bleeding appreciated. -Continue intermittent CBCs as an outpatient to track hemoglobin trend.  5-chronic diastolic heart failure -Compensated -Last 2-D echo in May 2018 demonstrating ejection fraction 60-75% with grade 1 diastolic dysfunction -Patient advised to follow a low-sodium diet -Will continue the use of beta blocker and low-dose Lasix on daily basis. -Daily weights and adequate hydration has been provided.  6-hypothyroidism  -Will continue current dose of Synthroid   7-GERD -Continue PPI   8-depression -Continue Cymbalta  9-morbid obesity  -Body mass index is 49.65 kg/m. -Low calorie and increase physical activity has been discussed with patient   Procedures: See below for x-ray reports  Consultations:  None  Discharge Exam: Vitals:   08/30/16 1253 08/30/16 1355  BP: (!) 95/55 (!) 101/49  Pulse:  71  Resp:    Temp:  98.4 F (36.9 C)    General: Afebrile, no chest pain, no shortness of breath, in no distress. Morbidly obese in appearance. Reports some  intermittent mild discomfort in her left knee; denies dizziness or  lightheadedness. Cardiovascular: S1 and S2, no rubs, no gallops, no JVD Respiratory: Good air movement bilaterally, no wheezing, no crackles Abdomen: Obese, soft, nontender, nondistended; positive bowel sounds no guarding.  Extremities: No edema, no cyanosis, no clubbing. Mild decreased range of motion of her left knee otherwise no acute abnormalities appreciated.   Discharge Instructions   Discharge Instructions    Diet - low sodium heart healthy    Complete by:  As directed    Discharge instructions    Complete by:  As directed    Take medications as prescribed Hold lisinopril and potassium supplementation until follow up with PCP Please keep yourself well hydrated Follow up low calorie and heart healthy diet  Follow up with PCP in 1 week   Increase activity slowly    Complete by:  As directed      Current Discharge Medication List    START taking these medications   Details  amLODipine (NORVASC) 2.5 MG tablet Take 1 tablet (2.5 mg total) by mouth daily. Qty: 30 tablet, Refills: 11      CONTINUE these medications which have NOT CHANGED   Details  acetaminophen (TYLENOL) 650 MG CR tablet Take 650 mg by mouth every 6 (six) hours. 6A, 12P, 6P, 12A    aspirin EC 81 MG EC tablet Take 1 tablet (81 mg total) by mouth daily.    carvedilol (COREG) 6.25 MG tablet Take 6.25 mg by mouth 2 (two) times daily with a meal.    cyclobenzaprine (FLEXERIL) 10 MG tablet Take 1 tablet by mouth 3 (three) times daily.    diclofenac sodium (VOLTAREN) 1 % GEL Apply 2 g topically every 6 (six) hours. Qty: 1 Tube, Refills: 1    DULoxetine (CYMBALTA) 60 MG capsule TAKE 1 CAPSULE BY MOUTH ONCE DAILY    Eszopiclone 3 MG TABS TAKE 1 TABLET BY MOUTH NIGHTLY IMMEDIATELY BEFORE BEDTIME Refills: 1    furosemide (LASIX) 20 MG tablet TAKE 1 TABLET (20 MG TOTAL) BY MOUTH DAILY. Qty: 30 tablet, Refills: 11    levothyroxine (SYNTHROID, LEVOTHROID) 150 MCG tablet Take 300 mcg by mouth daily before  breakfast.     LYRICA 200 MG capsule Take 200 mg by mouth 3 (three) times daily. Refills: 2    mirabegron ER (MYRBETRIQ) 50 MG TB24 tablet Take 50 mg by mouth daily.    budesonide-formoterol (SYMBICORT) 160-4.5 MCG/ACT inhaler Inhale 2 puffs into the lungs 2 (two) times daily as needed (shortness of breath.).     levalbuterol (XOPENEX) 0.63 MG/3ML nebulizer solution Take 3 mLs (0.63 mg total) by nebulization every 4 (four) hours as needed for wheezing or shortness of breath. Qty: 3 mL, Refills: 12    magnesium hydroxide (MILK OF MAGNESIA) 400 MG/5ML suspension Take 30 mLs by mouth daily as needed for mild constipation. If no BM in 3 days    OXYGEN Inhale 2 L/min into the lungs continuous.    pantoprazole (PROTONIX) 40 MG tablet Take 1 tablet (40 mg total) by mouth at bedtime. Qty: 30 tablet, Refills: 0    Vitamin D, Ergocalciferol, (DRISDOL) 50000 units CAPS capsule Take 50,000 Units by mouth every 7 (seven) days.      STOP taking these medications     lisinopril (PRINIVIL,ZESTRIL) 2.5 MG tablet      potassium chloride SA (K-DUR,KLOR-CON) 20 MEQ tablet      ibuprofen (ADVIL,MOTRIN) 600 MG tablet      metoprolol succinate (TOPROL-XL) 25  MG 24 hr tablet        Allergies  Allergen Reactions  . Morphine And Related Other (See Comments)    Respiratory suppression, respiratory failure when given IV  . Penicillins Other (See Comments)    intolerance   Follow-up [facility].   Specialty:  [place] Why:  [facility] physical/occupational therapy Contact information: [place] [address] [phone]        Red Christians, MD. Schedule an appointment as soon as possible for a visit in 1 week(s).   Specialty:  Oncology Contact information: [address] [place] [address] [phone]           The results of significant diagnostics from this hospitalization (including imaging,  microbiology, ancillary and laboratory) are listed below for reference.    Significant Diagnostic Studies: Dg Chest 1 View  Result Date: 08/28/2016 CLINICAL DATA:  Coughing for the past 2 weeks. Hypertension. Asthma. EXAM: CHEST 1 VIEW COMPARISON:  08/18/2016. FINDINGS: Cardiomegaly. Aortic tortuosity. Enlargement of the central pulmonary arteries. Moderate vascular congestion versus early pulmonary edema. Low lung volumes also noted. No effusion or pneumothorax. IMPRESSION: Cardiomegaly. Moderate vascular congestion versus early pulmonary edema. Worsening aeration from priors. Electronically Signed   By: Staci Righter M.D.   On: 08/28/2016 18:43   Dg Chest 2 View  Result Date: 08/18/2016 CLINICAL DATA:  Dyspnea and cough for 3 days. EXAM: CHEST  2 VIEW COMPARISON:  07/09/2016 FINDINGS: Moderate cardiomegaly and aortic tortuosity, unchanged. No consolidation. No effusion. Normal vasculature. IMPRESSION: Unchanged cardiomegaly and aortic tortuosity. No consolidation or effusion. Electronically Signed   By: Andreas Newport M.D.   On: 08/18/2016 00:32   Ct Angio Chest Pe W And/or Wo Contrast  Result Date: 08/18/2016 CLINICAL DATA:  Dyspnea. EXAM: CT ANGIOGRAPHY CHEST WITH CONTRAST TECHNIQUE: Multidetector CT imaging of the chest was performed using the standard protocol during bolus administration of intravenous contrast. Multiplanar CT image reconstructions and MIPs were obtained to evaluate the vascular anatomy. CONTRAST:  80 mL Isovue 370 intravenous COMPARISON:  08/27/2010, 08/18/2016 FINDINGS: Cardiovascular: Satisfactory opacification of the pulmonary arteries to the segmental level. No evidence of pulmonary embolism. There is enlargement of the central pulmonary arteries, raising the question of pulmonary arterial hypertension. Unchanged cardiomegaly. No pericardial effusion. Mediastinum/Nodes: No adenopathy in the hila or mediastinum. There is mural thickening of the distal thoracic esophagus.  Above this level there is gaseous distention of the esophagus. Lungs/Pleura: Right base scarring or atelectasis. Left lung is clear. No pleural effusions. Upper Abdomen: No acute abnormality. Musculoskeletal: No significant skeletal lesion Review of the MIP images confirms the above findings. IMPRESSION: 1. Negative for acute pulmonary embolism. There is enlargement of the central pulmonary arteries, raising the question of pulmonary arterial hypertension. 2. Mural thickening of the distal thoracic esophagus, with gaseous esophageal distention above this level. Cannot exclude an esophageal mass but this could also represent esophagitis. Electronically Signed   By: Andreas Newport M.D.   On: 08/18/2016 06:19   Mr Knee Left Wo Contrast  Result Date: 08/30/2016 CLINICAL DATA:  Generalized left knee pain. EXAM: MRI OF THE LEFT KNEE WITHOUT CONTRAST TECHNIQUE: Multiplanar, multisequence MR imaging of the knee was performed. No intravenous contrast was administered. COMPARISON:  None. FINDINGS: MENISCI Medial meniscus:  Maceration of the medial meniscus. Lateral meniscus:  Maceration of the lateral meniscus. LIGAMENTS Cruciates:  Complete chronic ACL tear.  Intact PCL. Collaterals: Medial  collateral ligament is intact. Lateral collateral ligament complex is intact. CARTILAGE Patellofemoral: Full-thickness cartilage loss of the lateral patellofemoral compartment. High-grade partial-thickness cartilage loss with areas of full-thickness cartilage loss of the medial patellofemoral compartment. Subchondral reactive marrow edema. Medial: Extensive full-thickness cartilage loss of the medial femorotibial compartment with marginal osteophytes and subchondral reactive marrow edema. Lateral: Extensive full-thickness cartilage loss of the lateral femorotibial compartment with marginal osteophytes. Joint: Small joint effusion. Mild edema in Hoffa's fat. No plical thickening. Popliteal Fossa:  Tiny Baker cyst.  Intact popliteus  tendon. Extensor Mechanism: Intact quadriceps tendon and patellar tendon. Intact medial and lateral patellar retinaculum. Intact MPFL. Bones: Ununited fracture of the superior pole of the patella with mild surrounding marrow edema. No other marrow signal abnormality. No other fracture or dislocation. Other: No fluid collection or hematoma. IMPRESSION: 1. 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 2. Maceration of the medial and lateral menisci. 3. Complete chronic ACL tear. 4. Ununited fracture of the superior pole of the patella with mild surrounding marrow edema. Electronically Signed   By: Kathreen Devoid   On: 08/30/2016 08:03   Dg Knee 4 Views W/patella Left  Result Date: 08/28/2016 CLINICAL DATA:  Pt fell, fx to patella x 2 months ago, treated with pt and brace, awaiting wt loss and total joint replacement, states she was walking up stairs today and felt kneecap "move" and had new onset pain. EXAM: LEFT KNEE - COMPLETE 4+ VIEW COMPARISON:  07/07/2016 FINDINGS: There is significant joint space narrowing involving the medial, lateral, and patellofemoral compartments. Again noted is patellar fracture without significant change in alignment. IMPRESSION: No significant change in alignment a patellar fracture. Significant tricompartmental degenerative changes. Electronically Signed   By: Nolon Nations M.D.   On: 08/28/2016 17:19    Labs: Basic Metabolic Panel:  Recent Labs Lab 08/28/16 1558 08/29/16 0454 08/30/16 0456  NA 145 147* 145  K 6.0* 4.8 4.6  CL 112* 110 107  CO2 25 28 31   GLUCOSE 79 90 86  BUN 28* 26* 27*  CREATININE 0.96 0.98 0.87  CALCIUM 7.6* 7.7* 7.1*   CBC:  Recent Labs Lab 08/28/16 2000 08/29/16 0454 08/30/16 0900  WBC 6.5 5.4 4.6  NEUTROABS 3.9  --   --   HGB 10.7* 9.9* 10.4*  HCT 34.0* 31.5* 32.8*  MCV 97.1 98.7 99.4  PLT 136* 132* 129*   BNP: BNP (last 3 results)  Recent Labs  03/10/16 0346 08/17/16 2355 08/28/16 2257  BNP 47.8 28.9 29.5    CBG:  Recent Labs Lab 08/28/16 2325  GLUCAP 89    Signed:  Barton Dubois MD.  [facility] 08/30/2016, 4:29 PM

## 2016-08-30 NOTE — Evaluation (Signed)
Physical Therapy Evaluation Patient Details Name: Jillian Eaton MRN: [id number] DOB: May 25, 1950 Today's Date: 08/30/2016   History of Present Illness  66 year old with a history of asthma, diastolic and systolic heart failure, hypertension, thyroid disease, restrictive lung disease, who presented 07/08/16 after fall getting into car. found to have nondisplaced left patella fracture.  Returned to ED 08/28/16 after experiencing increased knee pain while negotiating stairs  Clinical Impression  Pt admitted as above and presenting with functional mobility limitations 2* obesity, decreased L LE strength/ROM and ongoing L LE pain.  Pt should progress to return to previous living arrangement and continue with ongoing HHPT.    Follow Up Recommendations Home health PT    Equipment Recommendations  None recommended by PT    Recommendations for Other Services       Precautions / Restrictions Precautions Precautions: Fall;Knee Precaution Comments: KI all times Required Braces or Orthoses: Knee Immobilizer - Left Knee Immobilizer - Left: On at all times Restrictions Weight Bearing Restrictions: No Other Position/Activity Restrictions: WBAT      Mobility  Bed Mobility Overal bed mobility: Needs Assistance Bed Mobility: Supine to Sit;Sit to Supine     Supine to sit: Supervision Sit to supine: Min assist   General bed mobility comments: Increased time to EOB and assist to bring LEs up into bed  Transfers Overall transfer level: Needs assistance Equipment used: Rolling walker (2 wheeled) Transfers: Sit to/from Stand Sit to Stand: Min assist;Min guard         General transfer comment: cues for use of UEs.  Min guard to balance in initial standing  Ambulation/Gait Ambulation/Gait assistance: Min guard;Supervision Ambulation Distance (Feet): 150 Feet Assistive device: Rolling walker (2 wheeled) Gait Pattern/deviations: Decreased step length - right;Decreased step length -  left;Shuffle;Step-to pattern;Step-through pattern;Trunk flexed;Wide base of support Gait velocity: decr   General Gait Details: Antalgic gait with cues for posture and position from RW  Stairs Stairs: Yes Stairs assistance: Min assist Stair Management: Two rails;Step to pattern;Forwards Number of Stairs: 3 General stair comments: Pt self cued for sequence  Wheelchair Mobility    Modified Rankin (Stroke Patients Only)       Balance Overall balance assessment: Needs assistance Sitting-balance support: No upper extremity supported;Feet supported Sitting balance-Leahy Scale: Good     Standing balance support: No upper extremity supported Standing balance-Leahy Scale: Fair                               Pertinent Vitals/Pain Pain Assessment: 0-10 Pain Score: 3  Pain Location: L knee Pain Descriptors / Indicators: Sore Pain Intervention(s): Limited activity within patient's tolerance;Monitored during session    Home Living Family/patient expects to be discharged to:: Private residence Living Arrangements: Spouse/significant other Available Help at Discharge: Family;Available 24 hours/day Type of Home: House Home Access: Stairs to enter Entrance Stairs-Rails: None Entrance Stairs-Number of Steps: 1 Home Layout: Two level;Bed/bath upstairs Home Equipment: Tub bench;Walker - 2 wheels;Bedside commode;Wheelchair - manual Additional Comments: Difficulty getting upstairs to bedroom.    Prior Function Level of Independence: Needs assistance   Gait / Transfers Assistance Needed: has been staying on second level unles goes to appt.   ADL's / Homemaking Assistance Needed: Able to get bathed with effort, and dressed but occasionally husband assisted with LB dressing        Hand Dominance        Extremity/Trunk Assessment   Upper Extremity Assessment Upper Extremity Assessment: Overall [facility]  for tasks assessed    Lower Extremity Assessment Lower Extremity  Assessment: LLE deficits/detail LLE Deficits / Details: KI in place       Communication   Communication: No difficulties  Cognition Arousal/Alertness: Awake/alert Behavior During Therapy: WFL for tasks assessed/performed Overall Cognitive Status: Within Functional Limits for tasks assessed                                        General Comments      Exercises     Assessment/Plan    PT Assessment Patient needs continued PT services  PT Problem List Decreased strength;Decreased range of motion;Decreased activity tolerance;Decreased balance;Decreased mobility;Pain;Obesity;Decreased knowledge of use of DME       PT Treatment Interventions DME instruction;Gait training;Stair training;Functional mobility training;Therapeutic activities;Therapeutic exercise;Patient/family education    PT Goals (Current goals can be found in the Care Plan section)  Acute Rehab PT Goals Patient Stated Goal: Regain IND PT Goal Formulation: With patient Time For Goal Achievement: 09/09/16 Potential to Achieve Goals: Good    Frequency Min 3X/week   Barriers to discharge        Co-evaluation               AM-PAC PT "6 Clicks" Daily Activity  Outcome Measure Difficulty turning over in bed (including adjusting bedclothes, sheets and blankets)?: A Lot Difficulty moving from lying on back to sitting on the side of the bed? : A Lot Difficulty sitting down on and standing up from a chair with arms (e.g., wheelchair, bedside commode, etc,.)?: Total Help needed moving to and from a bed to chair (including a wheelchair)?: A Little Help needed walking in hospital room?: A Little Help needed climbing 3-5 steps with a railing? : A Lot 6 Click Score: 13    End of Session Equipment Utilized During Treatment: Gait belt;Left knee immobilizer Activity Tolerance: Patient tolerated treatment well Patient left: in bed;with call bell/phone within reach Nurse Communication: Mobility  status PT Visit Diagnosis: Unsteadiness on feet (R26.81)    Time: 0930-1006 PT Time Calculation (min) (ACUTE ONLY): 36 min   Charges:   PT Evaluation $PT Eval Low Complexity: 1 Procedure PT Treatments $Gait Training: 8-22 mins   PT G Codes:   PT G-Codes **NOT FOR INPATIENT CLASS** Functional Assessment Tool Used: AM-PAC 6 Clicks Basic Mobility Functional Limitation: Mobility: Walking and moving around Mobility: Walking and Moving Around Current Status (N8295): At least 40 percent but less than 60 percent impaired, limited or restricted Mobility: Walking and Moving Around Goal Status [phone]): At least 20 percent but less than 40 percent impaired, limited or restricted    Pg [phone]   Shawndell Schillaci 08/30/2016, 1:30 PM

## 2016-08-30 NOTE — Plan of Care (Signed)
Problem: Education: Goal: Knowledge of [facility] Education information/materials will improve Outcome: Completed/Met Date Met: 08/30/16 .  Problem: Safety: Goal: Ability to remain free from injury will improve Outcome: Progressing .  Problem: Health Behavior/Discharge Planning: Goal: Ability to manage health-related needs will improve Outcome: Progressing .  Problem: Pain Managment: Goal: General experience of comfort will improve Outcome: Progressing Pain manageable 5/10 to left knee. Continue with plan of care.  Problem: Physical Regulation: Goal: Ability to maintain clinical measurements within normal limits will improve Outcome: Progressing . Goal: Will remain free from infection Outcome: Completed/Met Date Met: 08/30/16 .  Problem: Skin Integrity: Goal: Risk for impaired skin integrity will decrease Outcome: Progressing .  Problem: Tissue Perfusion: Goal: Risk factors for ineffective tissue perfusion will decrease Outcome: Completed/Met Date Met: 08/30/16 .  Problem: Activity: Goal: Risk for activity intolerance will decrease Outcome: Progressing .  Problem: Fluid Volume: Goal: Ability to maintain a balanced intake and output will improve Outcome: Progressing .

## 2016-08-31 NOTE — Telephone Encounter (Signed)
Rx(s) sent to pharmacy electronically.  

## 2016-09-06 ENCOUNTER — Telehealth: Payer: Self-pay | Admitting: Internal Medicine

## 2016-09-06 NOTE — Telephone Encounter (Signed)
New message    Needs to have occupational therapy order for 3 weeks 2x a week

## 2016-09-06 NOTE — Telephone Encounter (Signed)
Routed to MD for review.

## 2016-09-07 NOTE — Telephone Encounter (Signed)
Defer to PCP on this - she was just hospitalized, but we were not involved.  Dr. Lemmie Evens

## 2016-09-07 NOTE — Telephone Encounter (Signed)
lmtcb

## 2016-09-08 NOTE — Telephone Encounter (Signed)
Nicolette called back and informed to call the PCP for the orders. She verbalized her understanding.

## 2016-09-08 NOTE — Telephone Encounter (Signed)
Follow up ° ° ° ° ° °Returning a call to the nurse °

## 2016-10-03 ENCOUNTER — Telehealth: Payer: Self-pay | Admitting: Cardiology

## 2016-10-03 NOTE — Telephone Encounter (Signed)
S/w [name] she states that she needs to eval for medication education and machine that monitors vital sign, BP O2 sat and weight. [place] for nurse eval? Please advise

## 2016-10-03 NOTE — Telephone Encounter (Signed)
She needs a verbal order for a nurse evaluation.

## 2016-10-03 NOTE — Telephone Encounter (Signed)
What kind of Eval? LMVM

## 2016-10-05 NOTE — Telephone Encounter (Signed)
Left detailed message for [facility]

## 2016-10-05 NOTE — Telephone Encounter (Signed)
Yes

## 2016-10-11 ENCOUNTER — Ambulatory Visit (INDEPENDENT_AMBULATORY_CARE_PROVIDER_SITE_OTHER): Payer: Federal, State, Local not specified - PPO | Admitting: Internal Medicine

## 2016-10-11 VITALS — BP 112/74 | HR 82 | Ht 67.0 in | Wt 339.0 lb

## 2016-10-11 DIAGNOSIS — I272 Pulmonary hypertension, unspecified: Secondary | ICD-10-CM | POA: Diagnosis not present

## 2016-10-11 DIAGNOSIS — I5032 Chronic diastolic (congestive) heart failure: Secondary | ICD-10-CM | POA: Diagnosis not present

## 2016-10-11 DIAGNOSIS — I10 Essential (primary) hypertension: Secondary | ICD-10-CM | POA: Diagnosis not present

## 2016-10-11 NOTE — Progress Notes (Signed)
OFFICE NOTE  Chief Complaint:  Feels well  Primary Care Physician: Lauraine Rinne, MD  HPI:  Jillian Eaton is a 66 year old female I saw a few weeks ago with a history of super morbid obesity, fibromyalgia and increasing shortness of breath. She had a cath in 2012 which showed normal coronaries, mildly elevating filling pressures, and diastolic pressures with a mean PA of 31. This correlated with her echocardiogram when RV systolic pressures in 1610 were 49 on echo. A repeat echocardiogram was just performed which demonstrated a preserved LVEF of 60-65% with moderate concentric LVH. There was mild to moderate increase in pulmonary artery pressure with peak at 51, which has not significantly changed from her study 1 year ago. Although the pressures look very similar her shortness of breath has been increasing significantly, and I recommended that she wear oxygen at night since she had that at home which does seem to be helping her at least feel better during the day as I suspect she has sleep apnea. She has been tested before which was apparently negative and is considering retesting at some point in the future. Overall I think the main issue obviously is weight, and she unfortunately says that she is not a candidate for gastric bypass and therefore it is a very difficult situation.  I referred her back to her pulmonologist in [facility] for ongoing evaluation of pulmonary hypertension. She tells me that she was then referred to [facility] and saw a specialist there who did another right heart catheterization but did not recommend any medications other than better blood pressure control.   In addition she had problems with kidney stones and underwent 2 operations regarding tthis.  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  She is now dependent on thyroid medication. Unfortunately she's not been able to lose weight, but her weight is fairly stable around  400 pounds as is her shortness of breath.  Cath in 2012:  LEFT HEART CATHETERIZATION  OPERATOR: [place] Jaja Switalski, MD, and Rolland Porter, MD  INDICATION: Dyspnea on exertion and chest pressure.  HISTORY OF PRESENT ILLNESS: Jillian Eaton is a morbidly obese (BMI greater than 72) female with a history of failed gastric bypass x2 with an increasing weight gain and increasing shortness of breath as well as new- onset dyspnea on exertion. She reports that she can only walk about 10 feet before she becomes short of breath and has chest pressure which she says get better with rest. She has numerous cardiac risk factors and given the high likelihood of false positive stress test, I have referred her for cardiac catheterization, both left and right heart as she has had elevated RVSP on echocardiogram of approximately 30 to 31 mmHg.  PROCEDURE: The patient was brought into the cardiac catheterization lab, sterilely prepped and draped in the usual fashion. The area around the right femoral artery and right brachial vein were cleansed and draped to allow an attempt at radial arterial and brachiocephalic venous access. IV was not able to be obtained prior to the procedure given her body habitus. With difficulty in assessing the vein, the ultrasound was eventually used to identify the right brachiocephalic vein, however, cannulation with needle and wire was not possible as the vein was very small in caliber. After mild local bleeding was controlled, we did turn our attention to the right femoral vein and with great difficulty using the ultrasound, the right femoral vein was accessed by Dr.  Ellyn Hack using a straight wire and a needle. After venous access was obtained, the attention was turned to the right radial artery by Dr. Ellyn Hack and simultaneous to that right heart catheterization was performed by myself without any difficulty. The right radial artery was successfully cannulated  and subsequently left coronary artery system was selectively injected with a 5-French TIG 4.0 catheter, however the right coronary artery could not be cannulated with the TIG catheter and was eventually cannulated with a JR-4 catheter. LV pressure was measured with a pigtail catheter. Estimated blood loss was about 30 mL. There were no acute complications. The patient received a total of 9 mg of Versed throughout the procedure as well as 225 mcg of fentanyl and was at no point greater than moderately sedated. She received 5000 units of heparin about 10 mL of a radial cocktail.  FINDINGS: 1. Left main - short, no disease. 2. LAD - no significant disease. 3. Left circumflex, no significant disease. 4. RCA - dominant, no disease, large-caliber vessel. 5. LVEDP = 20 mmHg. 6. RA - 12. 7. RV 38/12. 8. PA - 43/19 (31). 9. PCWP - 24. 10.TPG - 7. 11.Fick cardiac output/Fick cardiac index - 10.56/3.84. 12.Thermodilution cardiac output/thermodilution cardiac index -  6.78/2.47. 13.Aortic saturation - 94%. 14.PA saturation - 68%.  IMPRESSION: 1. No significant obstructive coronary artery disease. 2. LVEDP = 20 mmHg. 3. Borderline pulmonary venous hypertension. 4. High cardiac output.  Jillian Eaton returns today for follow-up. She reports that her shortness of breath has not significantly worsened, in fact, possibly is slightly better. She did have thyroid surgery last year at [place] [facility] and apparently underwent right heart catheterization prior to that. I do not have those records immediately available. Unfortunately she continues to maintain her weight and has not been able to lose any. She is complaining of some numbness and tingling in her feet which is likely neuropathy. She is on medication including Lyrica which she takes for fibromyalgia.  Jillian Eaton returns today for follow-up. She recently is been having more shortness of breath and lower extremity  swelling. She pointed out edema in her legs with very small blisters and some chronic venous stasis changes. She is not currently on a diuretic. She recently saw another new primary care provider with the [facility]. She was noted to be started on lisinopril 40 mg daily, which she is taking in addition to losartan 100 mg daily. The notes do not indicate from her office visit why she was started on lisinopril, but I can see through care everywhere that this was ordered by her primary care provider. She also takes amlodipine for blood pressure control. Her blood pressure was elevated initially at 178/94, but after resting came down to 120/78 and is at goal today. It is unusual, however to use both ACE inhibitors and ARBs.  07/02/2015  Jillian Eaton returns today for follow-up. She underwent a repeat echocardiogram for progressive dyspnea and exertion which showed a preserved LVEF of 6065% however there is moderate to severe pulmonary hypertension with an RVSP of 64 mmHg. This is increased about 10 mmHg compared to her prior study. Her mean pulmonary pressure by catheterization in 2012 was only 31 mmHg. I suspect that progressive pulmonary hypertension as a cause of her worsening shortness of breath. In fact, during recent hospitalization for surgery she required discharged with oxygen. She says she rarely uses oxygen at home but notes that she is hypoxic often when she checks her oxygen  levels. At her last office visit I recommended Lasix which she has been using sparingly. She has problems with incontinence and she does report an improvement in her swelling with it but does not notice significant change in her shortness of breath.  06/06/2016  Jillian Eaton returns from hospital follow-up. She was just admitted after an admission in January for unintentional narcotic overdose. She had unresponsiveness and possible aspiration. There was a second admission for a similar presentation with respiratory  failure, fever, sepsis and ultimately she was found to have a new onset cardiomyopathy with EF as low as 25%. Was felt that this was nonischemic. She was having intermittent atrial tachycardia and was felt that this might need to be managed with antiarrhythmic therapy however it seems to have resolved with carvedilol. She was placed on diuretics and reports that her breathing is close to baseline. She remains hypoxic with an O2 saturation 93% however was on home oxygen is result of her severe pulmonary hypertension. She does not feel that she needs to use the oxygen very regularly. From a heart failure standpoint she is on carvedilol, aspirin, furosemide and not currently on an ACE inhibitor, ARB or Entresto. Recent testing a renal function shows normal creatinine.  10/11/2016  Jillian Eaton is seen today in follow-up. In July she was hospitalized for hyperkalemia. She is on supplemental potassium and lisinopril, both were stopped. Fortunately her echo has improved back to normal recently. Overall she feels well. Her weight is down about 40 pounds of the recently she gained about 20 pounds back. She is working on that right as we speak.  PMHx:  Past Medical History:  Diagnosis Date  . Anemia   . Asthma   . Chronic headache   . DOE (dyspnea on exertion)    2D ECHO, 02/12/2012 - EF 60-65%, moderate concentric hypertrophy  . Fibromyalgia    nerve pain"left side at waist level" "can't lay on that side without pain" , "HOB elevation helps"  . Heart murmur   . Hematuria - cause not known   . History of kidney stones    x 2 '13, '14 surgery to remove  . Hypertension   . SBO (small bowel obstruction) ([place]) 06/07/2013  . SOB (shortness of breath)    NUCLEAR STRESS TEST, 03/02/2009 - no ischemic ST changes or symptoms  . Thyroid disease    "goiter"  . Transfusion history    10 yrs+  . Urinary incontinence, functional     Past Surgical History:  Procedure Laterality Date  . CARDIAC CATHETERIZATION   04/04/2010   No significant obstructive coronary artery disease  . CHOLECYSTECTOMY  1990  . COLONOSCOPY W/ POLYPECTOMY    . COLONOSCOPY WITH PROPOFOL N/A 04/10/2014   Procedure: COLONOSCOPY WITH PROPOFOL;  Surgeon: Beryle Beams, MD;  Location: [facility];  Service: Endoscopy;  Laterality: N/A;  . DIAGNOSTIC LAPAROSCOPY     x2 bowel obstructions(adhesions)  . 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 THYROIDECTOMY    . TUBAL LIGATION  1986    FAMHx:  Family History  Problem Relation Age of Onset  . Diabetes Mother   . Epilepsy Mother   . Cancer Mother        Breast  . Hypertension Mother   . Breast cancer Mother   . Kidney disease Father   . Diabetes Father   . Hypertension Father   . Asthma Father   . Heart disease Father   .  Epilepsy Sister   . Cancer Maternal Grandmother   . Breast cancer Maternal Grandmother   . Cancer Paternal Grandmother   . Breast cancer Paternal Grandmother     SOCHx:   reports that she has never smoked. She has never used smokeless tobacco. She reports that she does not drink alcohol or use drugs.  ALLERGIES:  Allergies  Allergen Reactions  . Morphine And Related Other (See Comments)    Respiratory suppression, respiratory failure when given IV  . Penicillins Other (See Comments)    intolerance    ROS: Pertinent items noted in HPI and remainder of comprehensive ROS otherwise negative.  HOME MEDS: Current Outpatient Prescriptions  Medication Sig Dispense Refill  . acetaminophen (TYLENOL) 650 MG CR tablet Take 650 mg by mouth every 6 (six) hours. 6A, 12P, 6P, 12A    . amLODipine (NORVASC) 2.5 MG tablet Take 1 tablet (2.5 mg total) by mouth daily. 30 tablet 11  . aspirin EC 81 MG EC tablet Take 1 tablet (81 mg total) by mouth daily.    . budesonide-formoterol (SYMBICORT) 160-4.5 MCG/ACT inhaler Inhale 2 puffs into the lungs 2 (two) times daily as needed (shortness of breath.).     Marland Kitchen carvedilol  (COREG) 6.25 MG tablet TAKE 1 TABLET BY MOUTH TWICE A DAY 180 tablet 2  . cyclobenzaprine (FLEXERIL) 10 MG tablet Take 1 tablet by mouth 3 (three) times daily.    . DULoxetine (CYMBALTA) 60 MG capsule TAKE 1 CAPSULE BY MOUTH ONCE DAILY    . Eszopiclone 3 MG TABS TAKE 1 TABLET BY MOUTH NIGHTLY IMMEDIATELY BEFORE BEDTIME  1  . furosemide (LASIX) 20 MG tablet TAKE 1 TABLET (20 MG TOTAL) BY MOUTH DAILY. 30 tablet 11  . levalbuterol (XOPENEX) 0.63 MG/3ML nebulizer solution Take 3 mLs (0.63 mg total) by nebulization every 4 (four) hours as needed for wheezing or shortness of breath. 3 mL 12  . levothyroxine (SYNTHROID, LEVOTHROID) 150 MCG tablet Take 300 mcg by mouth daily before breakfast.     . LYRICA 200 MG capsule Take 200 mg by mouth 3 (three) times daily.  2  . mirabegron ER (MYRBETRIQ) 50 MG TB24 tablet Take 50 mg by mouth daily.    . OXYGEN Inhale 2 L/min into the lungs continuous.    . Vitamin D, Ergocalciferol, (DRISDOL) 50000 units CAPS capsule Take 50,000 Units by mouth every 7 (seven) days.     No current facility-administered medications for this visit.     LABS/IMAGING: No results found for this or any previous visit (from the past 48 hour(s)). No results found.  VITALS: BP 112/74 (BP Location: Left Arm, Patient Position: Sitting, Cuff Size: Large)   Pulse 82   Ht 5\' 7"  (1.702 m)   Wt (!) 339 lb (153.8 kg)   BMI 53.09 kg/m   EXAM: General appearance: alert, morbidly obese and in wheelchair Neck: no carotid bruit and no JVD Lungs: diminished breath sounds bilaterally Heart: regular rate and rhythm Abdomen: soft, non-tender; bowel sounds normal; no masses,  no organomegaly Extremities: extremities normal, atraumatic, no cyanosis or edema Pulses: 2+ and symmetric Skin: Skin color, texture, turgor normal. No rashes or lesions Neurologic: Grossly normal Psych: Pleasant  EKG: Normal sinus rhythm at 82-personally reviewed  ASSESSMENT: 1. Acute systolic congestive heart  failure-LVEF 25% (improved to 60-65% in 06/2016) 2. Super morbid obesity, with failure of 3 gastric bypass procedures 3. Pulmonary hypertension - PA pressure of 64 mmHg, normal LV systolic function 4. Progressive DOE 5. Hypertension-controlled  6. Possible A. fib/ectopic atrial tachycardia-resolved  PLAN: 1.   Jillian Eaton had recent systolic congestive heart failure with improvement in LVEF in May 2018. She had some hyperkalemia was taken off of supplemental potassium and lisinopril. We'll continue her off of that for now. She is working on aggressive weight loss and we may be able to discontinue her amlodipine if she successful with that. Follow-up with me in 6 months or sooner as necessary.  Pixie Casino, MD, [facility] Attending Cardiologist [place] 10/11/2016, 3:27 PM

## 2016-10-11 NOTE — Patient Instructions (Signed)
Your physician wants you to follow-up in: 6 months with Dr. Hilty. You will receive a reminder letter in the mail two months in advance. If you don't receive a letter, please call our office to schedule the follow-up appointment.    

## 2017-04-01 ENCOUNTER — Telehealth: Payer: Self-pay | Admitting: Physician Assistant

## 2017-04-01 NOTE — Telephone Encounter (Signed)
Patient was prior history of nonischemic cardiomyopathy with normalization of LVEF by May 2018 on echocardiogram.  She contacted after our answering service with complaint of increasing shortness of breath with exertion.  She has some chest wall pain this worse with palpation.  However her main issue seems to be fluid accumulation.  She has accumulated roughly 15 pounds since the last time she saw Dr. [name].  Her PCP recently restarted her on 20 mg daily of Lasix since yesterday.  I asked her to take 40 mg daily of Lasix for today and tomorrow and go back to 20 mg daily thereafter.  She will need a early cardiology office visit in order to rule out significant heart failure.  She will contact our office tomorrow morning to arrange follow-up visits.

## 2017-04-02 ENCOUNTER — Telehealth: Payer: Self-pay | Admitting: Internal Medicine

## 2017-04-02 NOTE — Telephone Encounter (Signed)
Spoke with patient and scheduled appointment with Dr [name] for tomorrow as suggested by Janan Ridge PA per phone note 2/10

## 2017-04-02 NOTE — Telephone Encounter (Signed)
Thanks .Marland Kitchen Looks like I'm seeing her tomorrow and you will see her the following week.  Dr. Lemmie Evens

## 2017-04-02 NOTE — Telephone Encounter (Signed)
New message    Pt c/o Shortness Of Breath: STAT if SOB developed within the last 24 hours or pt is noticeably SOB on the phone  1. Are you currently SOB (can you hear that pt is SOB on the phone)? A "LITTLE" RIGHT NOW PER PATIENT  2. How long have you been experiencing SOB? 2 WEEKS  3. Are you SOB when sitting or when up moving around? SITTING AND MOVING  4. Are you currently experiencing any other symptoms? PANIC ATTACKS

## 2017-04-03 ENCOUNTER — Encounter: Payer: Self-pay | Admitting: Internal Medicine

## 2017-04-03 ENCOUNTER — Ambulatory Visit: Payer: Federal, State, Local not specified - PPO | Admitting: Internal Medicine

## 2017-04-03 VITALS — BP 140/80 | HR 86 | Ht 67.0 in | Wt 340.4 lb

## 2017-04-03 DIAGNOSIS — I272 Pulmonary hypertension, unspecified: Secondary | ICD-10-CM

## 2017-04-03 DIAGNOSIS — I509 Heart failure, unspecified: Secondary | ICD-10-CM | POA: Diagnosis not present

## 2017-04-03 DIAGNOSIS — I10 Essential (primary) hypertension: Secondary | ICD-10-CM | POA: Diagnosis not present

## 2017-04-03 NOTE — Progress Notes (Signed)
OFFICE NOTE  Chief Complaint:  Recent weight gain and shortness of breath  Primary Care Physician: Lauraine Rinne, MD  HPI:  Francella Barnett Court is a 67 year old female I saw a few weeks ago with a history of super morbid obesity, fibromyalgia and increasing shortness of breath. She had a cath in 2012 which showed normal coronaries, mildly elevating filling pressures, and diastolic pressures with a mean PA of 31. This correlated with her echocardiogram when RV systolic pressures in 2956 were 49 on echo. A repeat echocardiogram was just performed which demonstrated a preserved LVEF of 60-65% with moderate concentric LVH. There was mild to moderate increase in pulmonary artery pressure with peak at 51, which has not significantly changed from her study 1 year ago. Although the pressures look very similar her shortness of breath has been increasing significantly, and I recommended that she wear oxygen at night since she had that at home which does seem to be helping her at least feel better during the day as I suspect she has sleep apnea. She has been tested before which was apparently negative and is considering retesting at some point in the future. Overall I think the main issue obviously is weight, and she unfortunately says that she is not a candidate for gastric bypass and therefore it is a very difficult situation.  I referred her back to her pulmonologist in [facility] for ongoing evaluation of pulmonary hypertension. She tells me that she was then referred to [facility] and saw a specialist there who did another right heart catheterization but did not recommend any medications other than better blood pressure control.   In addition she had problems with kidney stones and underwent 2 operations regarding tthis.  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  She is now dependent on thyroid medication. Unfortunately she's not been able to lose weight, but  her weight is fairly stable around 400 pounds as is her shortness of breath.  Cath in 2012:  LEFT HEART CATHETERIZATION  OPERATOR: Mali Jaryan Chicoine, MD, and Rolland Porter, MD  INDICATION: Dyspnea on exertion and chest pressure.  HISTORY OF PRESENT ILLNESS: Ms. Bassinger is a morbidly obese (BMI greater than 49) female with a history of failed gastric bypass x2 with an increasing weight gain and increasing shortness of breath as well as new- onset dyspnea on exertion. She reports that she can only walk about 10 feet before she becomes short of breath and has chest pressure which she says get better with rest. She has numerous cardiac risk factors and given the high likelihood of false positive stress test, I have referred her for cardiac catheterization, both left and right heart as she has had elevated RVSP on echocardiogram of approximately 30 to 31 mmHg.  PROCEDURE: The patient was brought into the cardiac catheterization lab, sterilely prepped and draped in the usual fashion. The area around the right femoral artery and right brachial vein were cleansed and draped to allow an attempt at radial arterial and brachiocephalic venous access. IV was not able to be obtained prior to the procedure given her body habitus. With difficulty in assessing the vein, the ultrasound was eventually used to identify the right brachiocephalic vein, however, cannulation with needle and wire was not possible as the vein was very small in caliber. After mild local bleeding was controlled, we did turn our attention to the right femoral vein and with great difficulty using the ultrasound, the right femoral  vein was accessed by Dr. Ellyn Hack using a straight wire and a needle. After venous access was obtained, the attention was turned to the right radial artery by Dr. Ellyn Hack and simultaneous to that right heart catheterization was performed by myself without any difficulty. The right radial  artery was successfully cannulated and subsequently left coronary artery system was selectively injected with a 5-French TIG 4.0 catheter, however the right coronary artery could not be cannulated with the TIG catheter and was eventually cannulated with a JR-4 catheter. LV pressure was measured with a pigtail catheter. Estimated blood loss was about 30 mL. There were no acute complications. The patient received a total of 9 mg of Versed throughout the procedure as well as 225 mcg of fentanyl and was at no point greater than moderately sedated. She received 5000 units of heparin about 10 mL of a radial cocktail.  FINDINGS: 1. Left main - short, no disease. 2. LAD - no significant disease. 3. Left circumflex, no significant disease. 4. RCA - dominant, no disease, large-caliber vessel. 5. LVEDP = 20 mmHg. 6. RA - 12. 7. RV 38/12. 8. PA - 43/19 (31). 9. PCWP - 24. 10.TPG - 7. 11.Fick cardiac output/Fick cardiac index - 10.56/3.84. 12.Thermodilution cardiac output/thermodilution cardiac index -  6.78/2.47. 13.Aortic saturation - 94%. 14.PA saturation - 68%.  IMPRESSION: 1. No significant obstructive coronary artery disease. 2. LVEDP = 20 mmHg. 3. Borderline pulmonary venous hypertension. 4. High cardiac output.  Mrs. Donnan returns today for follow-up. She reports that her shortness of breath has not significantly worsened, in fact, possibly is slightly better. She did have thyroid surgery last year at [place] [facility] and apparently underwent right heart catheterization prior to that. I do not have those records immediately available. Unfortunately she continues to maintain her weight and has not been able to lose any. She is complaining of some numbness and tingling in her feet which is likely neuropathy. She is on medication including Lyrica which she takes for fibromyalgia.  Mrs. Chamblin returns today for follow-up. She recently is been having more  shortness of breath and lower extremity swelling. She pointed out edema in her legs with very small blisters and some chronic venous stasis changes. She is not currently on a diuretic. She recently saw another new primary care provider with the [facility]. She was noted to be started on lisinopril 40 mg daily, which she is taking in addition to losartan 100 mg daily. The notes do not indicate from her office visit why she was started on lisinopril, but I can see through care everywhere that this was ordered by her primary care provider. She also takes amlodipine for blood pressure control. Her blood pressure was elevated initially at 178/94, but after resting came down to 120/78 and is at goal today. It is unusual, however to use both ACE inhibitors and ARBs.  07/02/2015  Mrs. Scavone returns today for follow-up. She underwent a repeat echocardiogram for progressive dyspnea and exertion which showed a preserved LVEF of 6065% however there is moderate to severe pulmonary hypertension with an RVSP of 64 mmHg. This is increased about 10 mmHg compared to her prior study. Her mean pulmonary pressure by catheterization in 2012 was only 31 mmHg. I suspect that progressive pulmonary hypertension as a cause of her worsening shortness of breath. In fact, during recent hospitalization for surgery she required discharged with oxygen. She says she rarely uses oxygen at home but notes that she is hypoxic often  when she checks her oxygen levels. At her last office visit I recommended Lasix which she has been using sparingly. She has problems with incontinence and she does report an improvement in her swelling with it but does not notice significant change in her shortness of breath.  06/06/2016  Mrs. Alwine returns from hospital follow-up. She was just admitted after an admission in January for unintentional narcotic overdose. She had unresponsiveness and possible aspiration. There was a second admission for a  similar presentation with respiratory failure, fever, sepsis and ultimately she was found to have a new onset cardiomyopathy with EF as low as 25%. Was felt that this was nonischemic. She was having intermittent atrial tachycardia and was felt that this might need to be managed with antiarrhythmic therapy however it seems to have resolved with carvedilol. She was placed on diuretics and reports that her breathing is close to baseline. She remains hypoxic with an O2 saturation 93% however was on home oxygen is result of her severe pulmonary hypertension. She does not feel that she needs to use the oxygen very regularly. From a heart failure standpoint she is on carvedilol, aspirin, furosemide and not currently on an ACE inhibitor, ARB or Entresto. Recent testing a renal function shows normal creatinine.  10/11/2016  Mrs. Rengel is seen today in follow-up. In July she was hospitalized for hyperkalemia. She is on supplemental potassium and lisinopril, both were stopped. Fortunately her echo has improved back to normal recently. Overall she feels well. Her weight is down about 40 pounds of the recently she gained about 20 pounds back. She is working on that right as we speak.  04/03/2017  Mrs. Baugh was seen today in follow-up.  She recently called in and had complaints of worsening shortness of breath.  She saw her pulmonary doctor who did an x-ray noted a mild right upper lobe infiltrate versus edema.  Labs were obtained including BNP which was only mildly elevated at 110.  She was given 20 mg of Lasix with no benefit and then recently was advised over the phone to increase her Lasix to 40 mg for 2 days.  She urinated quite a bit and lost about 5 pounds of what she feels was water weight.  She reports mild improvement in her breathing.  She is quite anxious about what might have led to this fluid gain.  Given her history of cardiomyopathy, it is possible she could have had some recurrent cardiomyopathy.  She  reports stable diet and no worsening abuse of sodium.  PMHx:  Past Medical History:  Diagnosis Date  . Anemia   . Asthma   . Chronic headache   . DOE (dyspnea on exertion)    2D ECHO, 02/12/2012 - EF 60-65%, moderate concentric hypertrophy  . Fibromyalgia    nerve pain"left side at waist level" "can't lay on that side without pain" , "HOB elevation helps"  . Heart murmur   . Hematuria - cause not known   . History of kidney stones    x 2 '13, '14 surgery to remove  . Hypertension   . SBO (small bowel obstruction) ([place]) 06/07/2013  . SOB (shortness of breath)    NUCLEAR STRESS TEST, 03/02/2009 - no ischemic ST changes or symptoms  . Thyroid disease    "goiter"  . Transfusion history    10 yrs+  . Urinary incontinence, functional     Past Surgical History:  Procedure Laterality Date  . CARDIAC CATHETERIZATION  04/04/2010   No significant  obstructive coronary artery disease  . CHOLECYSTECTOMY  1990  . COLONOSCOPY W/ POLYPECTOMY    . COLONOSCOPY WITH PROPOFOL N/A 04/10/2014   Procedure: COLONOSCOPY WITH PROPOFOL;  Surgeon: Beryle Beams, MD;  Location: [facility] ENDOSCOPY;  Service: Endoscopy;  Laterality: N/A;  . DIAGNOSTIC LAPAROSCOPY     x2 bowel obstructions(adhesions)  . 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 THYROIDECTOMY    . TUBAL LIGATION  1986    FAMHx:  Family History  Problem Relation Age of Onset  . Diabetes Mother   . Epilepsy Mother   . Cancer Mother        Breast  . Hypertension Mother   . Breast cancer Mother   . Kidney disease Father   . Diabetes Father   . Hypertension Father   . Asthma Father   . Heart disease Father   . Epilepsy Sister   . Cancer Maternal Grandmother   . Breast cancer Maternal Grandmother   . Cancer Paternal Grandmother   . Breast cancer Paternal Grandmother     SOCHx:   reports that  has never smoked. she has never used smokeless tobacco. She reports that she does not drink alcohol or  use drugs.  ALLERGIES:  Allergies  Allergen Reactions  . Morphine And Related Other (See Comments)    Respiratory suppression, respiratory failure when given IV  . Penicillins Other (See Comments)    intolerance    ROS: Pertinent items noted in HPI and remainder of comprehensive ROS otherwise negative.  HOME MEDS: Current Outpatient Medications  Medication Sig Dispense Refill  . acetaminophen (TYLENOL) 650 MG CR tablet Take 650 mg by mouth every 6 (six) hours. 6A, 12P, 6P, 12A    . amLODipine (NORVASC) 2.5 MG tablet Take 1 tablet (2.5 mg total) by mouth daily. 30 tablet 11  . aspirin EC 81 MG EC tablet Take 1 tablet (81 mg total) by mouth daily.    . budesonide-formoterol (SYMBICORT) 160-4.5 MCG/ACT inhaler Inhale 2 puffs into the lungs 2 (two) times daily as needed (shortness of breath.).     Marland Kitchen carvedilol (COREG) 6.25 MG tablet TAKE 1 TABLET BY MOUTH TWICE A DAY 180 tablet 2  . cyclobenzaprine (FLEXERIL) 10 MG tablet Take 1 tablet by mouth 3 (three) times daily.    . DULoxetine (CYMBALTA) 60 MG capsule TAKE 1 CAPSULE BY MOUTH ONCE DAILY    . Eszopiclone 3 MG TABS TAKE 1 TABLET BY MOUTH NIGHTLY IMMEDIATELY BEFORE BEDTIME  1  . furosemide (LASIX) 20 MG tablet TAKE 1 TABLET (20 MG TOTAL) BY MOUTH DAILY. 30 tablet 11  . levalbuterol (XOPENEX) 0.63 MG/3ML nebulizer solution Take 3 mLs (0.63 mg total) by nebulization every 4 (four) hours as needed for wheezing or shortness of breath. 3 mL 12  . levothyroxine (SYNTHROID, LEVOTHROID) 150 MCG tablet Take 300 mcg by mouth daily before breakfast.     . LYRICA 200 MG capsule Take 200 mg by mouth 3 (three) times daily.  2  . mirabegron ER (MYRBETRIQ) 50 MG TB24 tablet Take 50 mg by mouth daily.    . OXYGEN Inhale 2 L/min into the lungs continuous.    . Vitamin D, Ergocalciferol, (DRISDOL) 50000 units CAPS capsule Take 50,000 Units by mouth every 7 (seven) days.     No current facility-administered medications for this visit.      LABS/IMAGING: No results found for this or any previous visit (from the past 48 hour(s)).  No results found.  VITALS: BP 140/80   Pulse 86   Ht 5\' 7"  (1.702 m)   Wt (!) 340 lb 6.4 oz (154.4 kg)   BMI 53.31 kg/m   EXAM: General appearance: alert, morbidly obese and in wheelchair Neck: no carotid bruit and no JVD Lungs: diminished breath sounds bilaterally Heart: regular rate and rhythm Abdomen: soft, non-tender; bowel sounds normal; no masses,  no organomegaly Extremities: extremities normal, atraumatic, no cyanosis or edema Pulses: 2+ and symmetric Skin: Skin color, texture, turgor normal. No rashes or lesions Neurologic: Grossly normal Psych: Pleasant  EKG: Normal sinus rhythm at 86-personally reviewed  ASSESSMENT: 1. Acute systolic congestive heart failure-LVEF 25% (improved to 60-65% in 06/2016) 2. Super morbid obesity, with failure of 3 gastric bypass procedures 3. Pulmonary hypertension - PA pressure of 64 mmHg, normal LV systolic function 4. Progressive DOE 5. Hypertension-controlled  6. Possible A. fib/ectopic atrial tachycardia-resolved  PLAN: 1.   Ms. Abdulaziz had recent weight gain after significant weight loss.  Her net weight does not appear much different but she said she had about 5 pound weight loss with a recent diuretics.  EF had improved to 60-65% in May 2018.  We will obtain a limited echo to see if there is been any decline in LV function and I advised her to stay on 20 mg daily Lasix for now.  Pixie Casino, MD, [facility], [place] Director of the Advanced Lipid Disorders &  Cardiovascular Risk Reduction Clinic Diplomate of the American Board of Clinical Lipidology Attending Cardiologist  Direct Dial: [phone]  Fax: [phone]  Website:  [email or web address] 04/03/2017, 1:13 PM

## 2017-04-03 NOTE — Patient Instructions (Addendum)
Your physician has recommended you make the following change in your medication:  DECREASE lasix to 20mg  once daily  Your physician has requested that you have a limited echocardiogram @ [address]. Echocardiography is a painless test that uses sound waves to create images of your heart. It provides your doctor with information about the size and shape of your heart and how well your heart's chambers and valves are working. This procedure takes approximately one hour. There are no restrictions for this procedure.  Your physician wants you to follow-up in: 6 months with Dr. [name]. You will receive a reminder letter in the mail two months in advance. If you don't receive a letter, please call our office to schedule the follow-up appointment.

## 2017-04-06 ENCOUNTER — Other Ambulatory Visit (HOSPITAL_COMMUNITY): Payer: Federal, State, Local not specified - PPO

## 2017-04-12 ENCOUNTER — Ambulatory Visit: Payer: Federal, State, Local not specified - PPO | Admitting: Physician Assistant

## 2017-04-19 DIAGNOSIS — J9611 Chronic respiratory failure with hypoxia: Secondary | ICD-10-CM | POA: Insufficient documentation

## 2017-05-28 DIAGNOSIS — R0602 Shortness of breath: Secondary | ICD-10-CM | POA: Diagnosis not present

## 2017-05-28 DIAGNOSIS — J9611 Chronic respiratory failure with hypoxia: Secondary | ICD-10-CM | POA: Diagnosis not present

## 2017-06-01 ENCOUNTER — Telehealth: Payer: Self-pay | Admitting: Internal Medicine

## 2017-06-01 ENCOUNTER — Other Ambulatory Visit: Payer: Self-pay | Admitting: Internal Medicine

## 2017-06-01 NOTE — Telephone Encounter (Signed)
Returned call to patient of Dr. [name]. She reports her HR has been fluctuating per pulse oximetry readings & Apple watch readings. She does not check her BP/HR at home. She reports a nurse was at her house on Tuesday and today to do assessment to see if she qualified for portable O2 tank. She reports Tuesday her HR was high and today it ranged from 60 - 103. Her HR was being checked by pulse at during these times, and she also notes her O2 sats would drop as low as 85%. The nurse recommended she increase O2 to 3L continuous. She states she may feel like she is out of rhythm/having palpitations when she gets SOB. She gets SOB walking about 6 feet from her bed to bathroom. She does not weigh herself. She reports minimal swelling. Advised patient that her HR will fluctuate with moving/exertion especially if she is not able to move around easily and also has SOB. Advised that she be evaluated in the office - scheduled for 4/16 with K. West Pugh (patient is going out of town on 4/17)  Routed to MD/DNP as Juluis Rainier

## 2017-06-01 NOTE — Telephone Encounter (Signed)
New Message  STAT if HR is under 50 or over 120 (normal HR is 60-100 beats per minute)  1) What is your heart rate? Ranging from 60-103  2) Do you have a log of your heart rate readings (document readings)? States that it was ranging from below 60 to 103  3) Do you have any other symptoms? Sob

## 2017-06-02 NOTE — Telephone Encounter (Signed)
Thank you. Will discuss further on that appointment

## 2017-06-05 ENCOUNTER — Ambulatory Visit: Payer: Federal, State, Local not specified - PPO | Admitting: Adult Health

## 2017-06-05 DIAGNOSIS — N200 Calculus of kidney: Secondary | ICD-10-CM | POA: Diagnosis not present

## 2017-06-05 DIAGNOSIS — R109 Unspecified abdominal pain: Secondary | ICD-10-CM | POA: Diagnosis not present

## 2017-06-05 DIAGNOSIS — R8290 Unspecified abnormal findings in urine: Secondary | ICD-10-CM | POA: Diagnosis not present

## 2017-06-05 DIAGNOSIS — K838 Other specified diseases of biliary tract: Secondary | ICD-10-CM | POA: Diagnosis not present

## 2017-06-05 DIAGNOSIS — D259 Leiomyoma of uterus, unspecified: Secondary | ICD-10-CM | POA: Diagnosis not present

## 2017-06-05 NOTE — Progress Notes (Deleted)
Cardiology Office Note   Date:  06/05/2017   ID:  Jillian Eaton, DOB Mar 09, 1950, MRN [id number]  PCP:  Lauraine Rinne, MD  Cardiologist:  HIlty No chief complaint on file.    History of Present Illness: Jillian Eaton is a 67 y.o. female who presents for for ongoing assessment and management of shortness of breath.  Patient had a normal cardiac catheterization in 2012 with no coronary artery disease.  She had mildly elevated filling pressures and diastolic pressures with a mean PA of 31 mmHg.  The patient had LVEF of 60-65% with moderate concentric LVH.  Other history includes hypertension, fibromyalgia, and super morbid obesity.  Due to her shortness of breath the patient was suspected to have sleep apnea had been tested before but apparently this was negative.  She was advised to wear oxygen at all times by Dr. Debara Eaton.  She was referred back to pulmonology for ongoing management.  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  She was last seen by Dr. Debara Eaton on 04/03/2017 with worsening shortness of breath patient had a chest x-ray which revealed mild pulmonary infiltrates was given Lasix 20 mg daily.  Patient states that the breathing had not improved and she was increased on her Lasix to 40 mg 2 days.  She is followed by home health nurse to call that this day that her oxygen dropping despite portable O2, and her heart rate was elevated with heart rates ranging between 60 and 103 bpm.  The patient was also complaining of palpitations.  She was placed on my schedule today for reassessment.  Past Medical History:  Diagnosis Date  . Anemia   . Asthma   . Chronic headache   . DOE (dyspnea on exertion)    2D ECHO, 02/12/2012 - EF 60-65%, moderate concentric hypertrophy  . Fibromyalgia    nerve pain"left side at waist level" "can't lay on that side without pain" , "HOB elevation helps"  . Heart murmur   . Hematuria - cause  not known   . History of kidney stones    x 2 '13, '14 surgery to remove  . Hypertension   . SBO (small bowel obstruction) ([place]) 06/07/2013  . SOB (shortness of breath)    NUCLEAR STRESS TEST, 03/02/2009 - no ischemic ST changes or symptoms  . Thyroid disease    "goiter"  . Transfusion history    10 yrs+  . Urinary incontinence, functional     Past Surgical History:  Procedure Laterality Date  . CARDIAC CATHETERIZATION  04/04/2010   No significant obstructive coronary artery disease  . CHOLECYSTECTOMY  1990  . COLONOSCOPY W/ POLYPECTOMY    . COLONOSCOPY WITH PROPOFOL N/A 04/10/2014   Procedure: COLONOSCOPY WITH PROPOFOL;  Surgeon: Beryle Beams, MD;  Location: [facility];  Service: Endoscopy;  Laterality: N/A;  . DIAGNOSTIC LAPAROSCOPY     x2 bowel obstructions(adhesions)  . 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 THYROIDECTOMY    . TUBAL LIGATION  1986     Current Outpatient Medications  Medication Sig Dispense Refill  . acetaminophen (TYLENOL) 650 MG CR tablet Take 650 mg by mouth every 6 (six) hours. 6A, 12P, 6P, 12A    . amLODipine (NORVASC) 2.5 MG tablet Take 1 tablet (2.5 mg total) by mouth daily. 30 tablet 11  . aspirin EC 81 MG EC tablet Take  1 tablet (81 mg total) by mouth daily.    . budesonide-formoterol (SYMBICORT) 160-4.5 MCG/ACT inhaler Inhale 2 puffs into the lungs 2 (two) times daily as needed (shortness of breath.).     Marland Kitchen carvedilol (COREG) 6.25 MG tablet TAKE 1 TABLET BY MOUTH TWICE A DAY 180 tablet 2  . cyclobenzaprine (FLEXERIL) 10 MG tablet Take 1 tablet by mouth 3 (three) times daily.    . DULoxetine (CYMBALTA) 60 MG capsule TAKE 1 CAPSULE BY MOUTH ONCE DAILY    . Eszopiclone 3 MG TABS TAKE 1 TABLET BY MOUTH NIGHTLY IMMEDIATELY BEFORE BEDTIME  1  . furosemide (LASIX) 20 MG tablet TAKE 1 TABLET (20 MG TOTAL) BY MOUTH DAILY. 30 tablet 11  . levalbuterol (XOPENEX) 0.63 MG/3ML nebulizer solution Take 3 mLs (0.63 mg  total) by nebulization every 4 (four) hours as needed for wheezing or shortness of breath. 3 mL 12  . levothyroxine (SYNTHROID, LEVOTHROID) 150 MCG tablet Take 300 mcg by mouth daily before breakfast.     . LYRICA 200 MG capsule Take 200 mg by mouth 3 (three) times daily.  2  . mirabegron ER (MYRBETRIQ) 50 MG TB24 tablet Take 50 mg by mouth daily.    . OXYGEN Inhale 2 L/min into the lungs continuous.    . Vitamin D, Ergocalciferol, (DRISDOL) 50000 units CAPS capsule Take 50,000 Units by mouth every 7 (seven) days.     No current facility-administered medications for this visit.     Allergies:   Morphine and related and Penicillins    Social History:  The patient  reports that she has never smoked. She has never used smokeless tobacco. She reports that she does not drink alcohol or use drugs.   Family History:  The patient's family history includes Asthma in her father; Breast cancer in her maternal grandmother, mother, and paternal grandmother; Cancer in her maternal grandmother, mother, and paternal grandmother; Diabetes in her father and mother; Epilepsy in her mother and sister; Heart disease in her father; Hypertension in her father and mother; Kidney disease in her father.    ROS: All other systems are reviewed and negative. Unless otherwise mentioned in H&P    PHYSICAL EXAM: VS:  There were no vitals taken for this visit. , BMI There is no height or weight on file to calculate BMI. GEN: Well nourished, well developed, in no acute distress HEENT: normal Neck: no JVD, carotid bruits, or masses Cardiac: ***RRR; no murmurs, rubs, or gallops,no edema  Respiratory:  clear to auscultation bilaterally, normal work of breathing GI: soft, nontender, nondistended, + BS MS: no deformity or atrophy Skin: warm and dry, no rash Neuro:  Strength and sensation are intact Psych: euthymic mood, full affect   EKG:  EKG {ACTION; IS/IS [id number]} ordered today. The ekg ordered today  demonstrates ***   Recent Labs: 07/11/2016: Magnesium 1.8 08/17/2016: ALT 10 08/28/2016: B Natriuretic Peptide 29.5 08/29/2016: TSH 0.046 08/30/2016: BUN 27; Creatinine, Ser 0.87; Hemoglobin 10.4; Platelets 129; Potassium 4.6; Sodium 145    Lipid Panel    Component Value Date/Time   TRIG 64 04/23/2016 2001      Wt Readings from Last 3 Encounters:  04/03/17 (!) 340 lb 6.4 oz (154.4 kg)  10/11/16 (!) 339 lb (153.8 kg)  08/30/16 (!) 317 lb 0.3 oz (143.8 kg)      Other studies Reviewed: Additional studies/ records that were reviewed today include: ***. Review of the above records demonstrates: ***   ASSESSMENT AND PLAN:  1.  ***  Current medicines are reviewed at length with the patient today.    Labs/ tests ordered today include: *** Phill Myron. [place] Pugh, ANP, AACC   06/05/2017 7:18 AM    [facility] [address], [place], [place] [address] Phone: [phone]; Fax: [phone]

## 2017-06-06 DIAGNOSIS — N3001 Acute cystitis with hematuria: Secondary | ICD-10-CM | POA: Diagnosis not present

## 2017-06-06 DIAGNOSIS — I248 Other forms of acute ischemic heart disease: Secondary | ICD-10-CM | POA: Diagnosis not present

## 2017-06-06 DIAGNOSIS — R748 Abnormal levels of other serum enzymes: Secondary | ICD-10-CM | POA: Diagnosis not present

## 2017-06-06 DIAGNOSIS — I110 Hypertensive heart disease with heart failure: Secondary | ICD-10-CM | POA: Diagnosis not present

## 2017-06-06 DIAGNOSIS — R0602 Shortness of breath: Secondary | ICD-10-CM | POA: Diagnosis not present

## 2017-06-06 DIAGNOSIS — I517 Cardiomegaly: Secondary | ICD-10-CM | POA: Diagnosis not present

## 2017-06-06 DIAGNOSIS — R109 Unspecified abdominal pain: Secondary | ICD-10-CM | POA: Diagnosis not present

## 2017-06-06 DIAGNOSIS — R1011 Right upper quadrant pain: Secondary | ICD-10-CM | POA: Diagnosis not present

## 2017-06-06 DIAGNOSIS — I4891 Unspecified atrial fibrillation: Secondary | ICD-10-CM | POA: Diagnosis not present

## 2017-06-06 DIAGNOSIS — Z6841 Body Mass Index (BMI) 40.0 and over, adult: Secondary | ICD-10-CM | POA: Diagnosis not present

## 2017-06-06 DIAGNOSIS — R0603 Acute respiratory distress: Secondary | ICD-10-CM | POA: Diagnosis not present

## 2017-06-06 DIAGNOSIS — R779 Abnormality of plasma protein, unspecified: Secondary | ICD-10-CM | POA: Diagnosis not present

## 2017-06-06 DIAGNOSIS — I10 Essential (primary) hypertension: Secondary | ICD-10-CM | POA: Diagnosis not present

## 2017-06-06 DIAGNOSIS — R188 Other ascites: Secondary | ICD-10-CM | POA: Diagnosis not present

## 2017-06-06 DIAGNOSIS — I5033 Acute on chronic diastolic (congestive) heart failure: Secondary | ICD-10-CM | POA: Diagnosis not present

## 2017-06-06 DIAGNOSIS — M797 Fibromyalgia: Secondary | ICD-10-CM | POA: Diagnosis not present

## 2017-06-06 DIAGNOSIS — R7989 Other specified abnormal findings of blood chemistry: Secondary | ICD-10-CM | POA: Diagnosis not present

## 2017-06-06 DIAGNOSIS — R0989 Other specified symptoms and signs involving the circulatory and respiratory systems: Secondary | ICD-10-CM | POA: Diagnosis not present

## 2017-06-06 DIAGNOSIS — I5031 Acute diastolic (congestive) heart failure: Secondary | ICD-10-CM | POA: Diagnosis not present

## 2017-06-07 DIAGNOSIS — Z6841 Body Mass Index (BMI) 40.0 and over, adult: Secondary | ICD-10-CM | POA: Diagnosis not present

## 2017-06-07 DIAGNOSIS — I340 Nonrheumatic mitral (valve) insufficiency: Secondary | ICD-10-CM | POA: Diagnosis not present

## 2017-06-07 DIAGNOSIS — I501 Left ventricular failure: Secondary | ICD-10-CM | POA: Diagnosis not present

## 2017-06-07 DIAGNOSIS — I248 Other forms of acute ischemic heart disease: Secondary | ICD-10-CM | POA: Diagnosis not present

## 2017-06-07 DIAGNOSIS — R109 Unspecified abdominal pain: Secondary | ICD-10-CM | POA: Diagnosis not present

## 2017-06-07 DIAGNOSIS — I517 Cardiomegaly: Secondary | ICD-10-CM | POA: Diagnosis not present

## 2017-06-07 DIAGNOSIS — N3001 Acute cystitis with hematuria: Secondary | ICD-10-CM | POA: Diagnosis present

## 2017-06-07 DIAGNOSIS — I5033 Acute on chronic diastolic (congestive) heart failure: Secondary | ICD-10-CM | POA: Diagnosis present

## 2017-06-07 DIAGNOSIS — R7989 Other specified abnormal findings of blood chemistry: Secondary | ICD-10-CM | POA: Diagnosis not present

## 2017-06-07 DIAGNOSIS — I4581 Long QT syndrome: Secondary | ICD-10-CM | POA: Diagnosis not present

## 2017-06-07 DIAGNOSIS — M797 Fibromyalgia: Secondary | ICD-10-CM | POA: Diagnosis not present

## 2017-06-07 DIAGNOSIS — I10 Essential (primary) hypertension: Secondary | ICD-10-CM | POA: Diagnosis not present

## 2017-06-07 DIAGNOSIS — I480 Paroxysmal atrial fibrillation: Secondary | ICD-10-CM | POA: Diagnosis not present

## 2017-06-07 DIAGNOSIS — I361 Nonrheumatic tricuspid (valve) insufficiency: Secondary | ICD-10-CM | POA: Diagnosis not present

## 2017-06-07 DIAGNOSIS — R0603 Acute respiratory distress: Secondary | ICD-10-CM | POA: Diagnosis not present

## 2017-06-07 DIAGNOSIS — E039 Hypothyroidism, unspecified: Secondary | ICD-10-CM | POA: Diagnosis present

## 2017-06-07 DIAGNOSIS — R748 Abnormal levels of other serum enzymes: Secondary | ICD-10-CM | POA: Diagnosis not present

## 2017-06-07 DIAGNOSIS — Z79899 Other long term (current) drug therapy: Secondary | ICD-10-CM | POA: Diagnosis not present

## 2017-06-07 DIAGNOSIS — I4891 Unspecified atrial fibrillation: Secondary | ICD-10-CM | POA: Diagnosis not present

## 2017-06-07 DIAGNOSIS — I110 Hypertensive heart disease with heart failure: Secondary | ICD-10-CM | POA: Diagnosis present

## 2017-06-07 DIAGNOSIS — G4700 Insomnia, unspecified: Secondary | ICD-10-CM | POA: Diagnosis present

## 2017-06-07 DIAGNOSIS — Z87442 Personal history of urinary calculi: Secondary | ICD-10-CM | POA: Diagnosis not present

## 2017-06-07 DIAGNOSIS — I5031 Acute diastolic (congestive) heart failure: Secondary | ICD-10-CM | POA: Diagnosis not present

## 2017-06-07 DIAGNOSIS — I351 Nonrheumatic aortic (valve) insufficiency: Secondary | ICD-10-CM | POA: Diagnosis not present

## 2017-06-07 DIAGNOSIS — I270 Primary pulmonary hypertension: Secondary | ICD-10-CM | POA: Diagnosis not present

## 2017-06-19 ENCOUNTER — Telehealth: Payer: Self-pay | Admitting: Internal Medicine

## 2017-06-19 ENCOUNTER — Encounter: Payer: Self-pay | Admitting: Internal Medicine

## 2017-06-19 ENCOUNTER — Ambulatory Visit (INDEPENDENT_AMBULATORY_CARE_PROVIDER_SITE_OTHER): Payer: Medicare Other | Admitting: Internal Medicine

## 2017-06-19 VITALS — BP 104/68 | HR 105 | Ht 67.0 in | Wt 346.0 lb

## 2017-06-19 DIAGNOSIS — I4891 Unspecified atrial fibrillation: Secondary | ICD-10-CM | POA: Diagnosis not present

## 2017-06-19 DIAGNOSIS — R0602 Shortness of breath: Secondary | ICD-10-CM | POA: Diagnosis not present

## 2017-06-19 DIAGNOSIS — E039 Hypothyroidism, unspecified: Secondary | ICD-10-CM

## 2017-06-19 DIAGNOSIS — Z7901 Long term (current) use of anticoagulants: Secondary | ICD-10-CM | POA: Diagnosis not present

## 2017-06-19 MED ORDER — CARVEDILOL 12.5 MG PO TABS: 12.5000 mg | ORAL_TABLET | Freq: Two times a day (BID) | ORAL | 11 refills | Status: DC

## 2017-06-19 MED ORDER — FUROSEMIDE 20 MG PO TABS: 40.0000 mg | ORAL_TABLET | Freq: Every day | ORAL | 11 refills | Status: DC

## 2017-06-19 NOTE — Telephone Encounter (Signed)
New Message:      Pt states she is calling because she forgot to tell the dr about her dizziness and blurred vision.

## 2017-06-19 NOTE — Patient Instructions (Signed)
Medication Instructions:   INCREASE lasix to 40mg  daily STOP amlodipine INCREASE carvedilol to 12.5mg  twice daily CONTINUE xarelto - follow up with Ob-Gyn  Labwork:  TSH, Free T4, CBC, CMET, BNP  Testing/Procedures:  Limited Echo - ONLY IF this test was not done at [facility] -- we will request records  Follow-Up:  ONE MONTH with Dr. [name]  If you need a refill on your cardiac medications before your next appointment, please call your pharmacy.  Any Other Special Instructions Will Be Listed Below (If Applicable).

## 2017-06-19 NOTE — Telephone Encounter (Signed)
Pt aware will let Dr [name] know about the dizziness, blurred vision, and occ double vision forgot to tell him at todays visit ./cy

## 2017-06-19 NOTE — Progress Notes (Signed)
OFFICE NOTE  Chief Complaint:  Recent weight gain and shortness of breath  Primary Care Physician: Lauraine Rinne, MD  HPI:  Jillian Eaton is a 67 year old female I saw a few weeks ago with a history of super morbid obesity, fibromyalgia and increasing shortness of breath. She had a cath in 2012 which showed normal coronaries, mildly elevating filling pressures, and diastolic pressures with a mean PA of 31. This correlated with her echocardiogram when RV systolic pressures in 1950 were 49 on echo. A repeat echocardiogram was just performed which demonstrated a preserved LVEF of 60-65% with moderate concentric LVH. There was mild to moderate increase in pulmonary artery pressure with peak at 51, which has not significantly changed from her study 1 year ago. Although the pressures look very similar her shortness of breath has been increasing significantly, and I recommended that she wear oxygen at night since she had that at home which does seem to be helping her at least feel better during the day as I suspect she has sleep apnea. She has been tested before which was apparently negative and is considering retesting at some point in the future. Overall I think the main issue obviously is weight, and she unfortunately says that she is not a candidate for gastric bypass and therefore it is a very difficult situation.  I referred her back to her pulmonologist in [facility] for ongoing evaluation of pulmonary hypertension. She tells me that she was then referred to [facility] and saw a specialist there who did another right heart catheterization but did not recommend any medications other than better blood pressure control.   In addition she had problems with kidney stones and underwent 2 operations regarding tthis.  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  She is now dependent on thyroid medication. Unfortunately she's not been able to lose weight, but  her weight is fairly stable around 400 pounds as is her shortness of breath.  Cath in 2012:  LEFT HEART CATHETERIZATION  OPERATOR: [place] Romelle Reiley, MD, and Rolland Porter, MD  INDICATION: Dyspnea on exertion and chest pressure.  HISTORY OF PRESENT ILLNESS: Jillian Eaton is a morbidly obese (BMI greater than 48) female with a history of failed gastric bypass x2 with an increasing weight gain and increasing shortness of breath as well as new- onset dyspnea on exertion. She reports that she can only walk about 10 feet before she becomes short of breath and has chest pressure which she says get better with rest. She has numerous cardiac risk factors and given the high likelihood of false positive stress test, I have referred her for cardiac catheterization, both left and right heart as she has had elevated RVSP on echocardiogram of approximately 30 to 31 mmHg.  PROCEDURE: The patient was brought into the cardiac catheterization lab, sterilely prepped and draped in the usual fashion. The area around the right femoral artery and right brachial vein were cleansed and draped to allow an attempt at radial arterial and brachiocephalic venous access. IV was not able to be obtained prior to the procedure given her body habitus. With difficulty in assessing the vein, the ultrasound was eventually used to identify the right brachiocephalic vein, however, cannulation with needle and wire was not possible as the vein was very small in caliber. After mild local bleeding was controlled, we did turn our attention to the right femoral vein and with great difficulty using the ultrasound, the right femoral  vein was accessed by Dr. Ellyn Hack using a straight wire and a needle. After venous access was obtained, the attention was turned to the right radial artery by Dr. Ellyn Hack and simultaneous to that right heart catheterization was performed by myself without any difficulty. The right radial  artery was successfully cannulated and subsequently left coronary artery system was selectively injected with a 5-French TIG 4.0 catheter, however the right coronary artery could not be cannulated with the TIG catheter and was eventually cannulated with a JR-4 catheter. LV pressure was measured with a pigtail catheter. Estimated blood loss was about 30 mL. There were no acute complications. The patient received a total of 9 mg of Versed throughout the procedure as well as 225 mcg of fentanyl and was at no point greater than moderately sedated. She received 5000 units of heparin about 10 mL of a radial cocktail.  FINDINGS: 1. Left main - short, no disease. 2. LAD - no significant disease. 3. Left circumflex, no significant disease. 4. RCA - dominant, no disease, large-caliber vessel. 5. LVEDP = 20 mmHg. 6. RA - 12. 7. RV 38/12. 8. PA - 43/19 (31). 9. PCWP - 24. 10.TPG - 7. 11.Fick cardiac output/Fick cardiac index - 10.56/3.84. 12.Thermodilution cardiac output/thermodilution cardiac index -  6.78/2.47. 13.Aortic saturation - 94%. 14.PA saturation - 68%.  IMPRESSION: 1. No significant obstructive coronary artery disease. 2. LVEDP = 20 mmHg. 3. Borderline pulmonary venous hypertension. 4. High cardiac output.  Jillian Eaton returns today for follow-up. She reports that her shortness of breath has not significantly worsened, in fact, possibly is slightly better. She did have thyroid surgery last year at [facility] and apparently underwent right heart catheterization prior to that. I do not have those records immediately available. Unfortunately she continues to maintain her weight and has not been able to lose any. She is complaining of some numbness and tingling in her feet which is likely neuropathy. She is on medication including Lyrica which she takes for fibromyalgia.  Jillian Eaton returns today for follow-up. She recently is been having more  shortness of breath and lower extremity swelling. She pointed out edema in her legs with very small blisters and some chronic venous stasis changes. She is not currently on a diuretic. She recently saw another new primary care provider with the [facility]. She was noted to be started on lisinopril 40 mg daily, which she is taking in addition to losartan 100 mg daily. The notes do not indicate from her office visit why she was started on lisinopril, but I can see through care everywhere that this was ordered by her primary care provider. She also takes amlodipine for blood pressure control. Her blood pressure was elevated initially at 178/94, but after resting came down to 120/78 and is at goal today. It is unusual, however to use both ACE inhibitors and ARBs.  07/02/2015  Jillian Eaton returns today for follow-up. She underwent a repeat echocardiogram for progressive dyspnea and exertion which showed a preserved LVEF of 6065% however there is moderate to severe pulmonary hypertension with an RVSP of 64 mmHg. This is increased about 10 mmHg compared to her prior study. Her mean pulmonary pressure by catheterization in 2012 was only 31 mmHg. I suspect that progressive pulmonary hypertension as a cause of her worsening shortness of breath. In fact, during recent hospitalization for surgery she required discharged with oxygen. She says she rarely uses oxygen at home but notes that she is hypoxic often  when she checks her oxygen levels. At her last office visit I recommended Lasix which she has been using sparingly. She has problems with incontinence and she does report an improvement in her swelling with it but does not notice significant change in her shortness of breath.  06/06/2016  Jillian Eaton returns from hospital follow-up. She was just admitted after an admission in January for unintentional narcotic overdose. She had unresponsiveness and possible aspiration. There was a second admission for a  similar presentation with respiratory failure, fever, sepsis and ultimately she was found to have a new onset cardiomyopathy with EF as low as 25%. Was felt that this was nonischemic. She was having intermittent atrial tachycardia and was felt that this might need to be managed with antiarrhythmic therapy however it seems to have resolved with carvedilol. She was placed on diuretics and reports that her breathing is close to baseline. She remains hypoxic with an O2 saturation 93% however was on home oxygen is result of her severe pulmonary hypertension. She does not feel that she needs to use the oxygen very regularly. From a heart failure standpoint she is on carvedilol, aspirin, furosemide and not currently on an ACE inhibitor, ARB or Entresto. Recent testing a renal function shows normal creatinine.  10/11/2016  Jillian Eaton is seen today in follow-up. In July she was hospitalized for hyperkalemia. She is on supplemental potassium and lisinopril, both were stopped. Fortunately her echo has improved back to normal recently. Overall she feels well. Her weight is down about 40 pounds of the recently she gained about 20 pounds back. She is working on that right as we speak.  04/03/2017  Jillian Eaton was seen today in follow-up.  She recently called in and had complaints of worsening shortness of breath.  She saw her pulmonary doctor who did an x-ray noted a mild right upper lobe infiltrate versus edema.  Labs were obtained including BNP which was only mildly elevated at 110.  She was given 20 mg of Lasix with no benefit and then recently was advised over the phone to increase her Lasix to 40 mg for 2 days.  She urinated quite a bit and lost about 5 pounds of what she feels was water weight.  She reports mild improvement in her breathing.  She is quite anxious about what might have led to this fluid gain.  Given her history of cardiomyopathy, it is possible she could have had some recurrent cardiomyopathy.  She  reports stable diet and no worsening abuse of sodium.  06/19/2017  Jillian Eaton returns today for follow-up.  She was supposed to have a limited echocardiogram but that never happened.  Approximately 2 to 3 weeks ago she called the office reporting palpitations.  We try to schedule an earlier appointment, but she ended up going to [facility] for her [facility].  On the flight she apparently became short of breath had some left flank pain and was hypoxic.  She was given oxygen and after landing they took her to the [facility] in [place].  She was treated there and found to be in A. fib with RVR.  This is a new finding.  She had a remote history of either PAT or PAF, but so brief that she was not anticoagulated.  She was then started on Xarelto.  She tells me she was given Lasix and diuresed.  She returns today for follow-up and still reports some shortness of breath.  Weight is about 6 pounds heavier than  she was 2 months ago.  She is in A. fib persistently with heart rates in the low 100s.  In addition she reports after starting the Xarelto that she has been having some vaginal bleeding.  PMHx:  Past Medical History:  Diagnosis Date  . Anemia   . Asthma   . Chronic headache   . DOE (dyspnea on exertion)    2D ECHO, 02/12/2012 - EF 60-65%, moderate concentric hypertrophy  . Fibromyalgia    nerve pain"left side at waist level" "can't lay on that side without pain" , "HOB elevation helps"  . Heart murmur   . Hematuria - cause not known   . History of kidney stones    x 2 '13, '14 surgery to remove  . Hypertension   . SBO (small bowel obstruction) ([place]) 06/07/2013  . SOB (shortness of breath)    NUCLEAR STRESS TEST, 03/02/2009 - no ischemic ST changes or symptoms  . Thyroid disease    "goiter"  . Transfusion history    10 yrs+  . Urinary incontinence, functional     Past Surgical History:  Procedure Laterality Date  . CARDIAC CATHETERIZATION  04/04/2010   No significant obstructive  coronary artery disease  . CHOLECYSTECTOMY  1990  . COLONOSCOPY W/ POLYPECTOMY    . COLONOSCOPY WITH PROPOFOL N/A 04/10/2014   Procedure: COLONOSCOPY WITH PROPOFOL;  Surgeon: Beryle Beams, MD;  Location: [facility] ENDOSCOPY;  Service: Endoscopy;  Laterality: N/A;  . DIAGNOSTIC LAPAROSCOPY     x2 bowel obstructions(adhesions)  . 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 THYROIDECTOMY    . TUBAL LIGATION  1986    FAMHx:  Family History  Problem Relation Age of Onset  . Diabetes Mother   . Epilepsy Mother   . Cancer Mother        Breast  . Hypertension Mother   . Breast cancer Mother   . Kidney disease Father   . Diabetes Father   . Hypertension Father   . Asthma Father   . Heart disease Father   . Epilepsy Sister   . Cancer Maternal Grandmother   . Breast cancer Maternal Grandmother   . Cancer Paternal Grandmother   . Breast cancer Paternal Grandmother     SOCHx:   reports that she has never smoked. She has never used smokeless tobacco. She reports that she does not drink alcohol or use drugs.  ALLERGIES:  Allergies  Allergen Reactions  . Norco [Hydrocodone-Acetaminophen]     ROS: Pertinent items noted in HPI and remainder of comprehensive ROS otherwise negative.  HOME MEDS: Current Outpatient Medications  Medication Sig Dispense Refill  . acetaminophen (TYLENOL) 650 MG CR tablet Take 650 mg by mouth every 6 (six) hours. 6A, 12P, 6P, 12A    . amLODipine (NORVASC) 2.5 MG tablet Take 1 tablet (2.5 mg total) by mouth daily. 30 tablet 11  . aspirin EC 81 MG EC tablet Take 1 tablet (81 mg total) by mouth daily.    . budesonide-formoterol (SYMBICORT) 160-4.5 MCG/ACT inhaler Inhale 2 puffs into the lungs 2 (two) times daily as needed (shortness of breath.).     Marland Kitchen carvedilol (COREG) 6.25 MG tablet TAKE 1 TABLET BY MOUTH TWICE A DAY 180 tablet 2  . cyclobenzaprine (FLEXERIL) 10 MG tablet Take 1 tablet by mouth 3 (three) times daily.    .  DULoxetine (CYMBALTA) 60 MG capsule TAKE 1 CAPSULE BY MOUTH ONCE DAILY    .  Eszopiclone 3 MG TABS TAKE 1 TABLET BY MOUTH NIGHTLY IMMEDIATELY BEFORE BEDTIME  1  . furosemide (LASIX) 20 MG tablet TAKE 1 TABLET (20 MG TOTAL) BY MOUTH DAILY. 30 tablet 11  . levalbuterol (XOPENEX) 0.63 MG/3ML nebulizer solution Take 3 mLs (0.63 mg total) by nebulization every 4 (four) hours as needed for wheezing or shortness of breath. 3 mL 12  . levothyroxine (SYNTHROID, LEVOTHROID) 150 MCG tablet Take 300 mcg by mouth daily before breakfast.     . LYRICA 200 MG capsule Take 200 mg by mouth 3 (three) times daily.  2  . mirabegron ER (MYRBETRIQ) 50 MG TB24 tablet Take 50 mg by mouth daily.    . OXYGEN Inhale 2 L/min into the lungs continuous.    . rivaroxaban (XARELTO) 20 MG TABS tablet Take 20 mg by mouth daily with supper.    . Vitamin D, Ergocalciferol, (DRISDOL) 50000 units CAPS capsule Take 50,000 Units by mouth every 7 (seven) days.     No current facility-administered medications for this visit.     LABS/IMAGING: No results found for this or any previous visit (from the past 48 hour(s)). No results found.  VITALS: BP 104/68   Pulse (!) 105   Ht 5\' 7"  (1.702 m)   Wt (!) 346 lb (156.9 kg)   BMI 54.19 kg/m   EXAM: General appearance: alert, morbidly obese and in wheelchair Neck: no carotid bruit and no JVD Lungs: diminished breath sounds bilaterally Heart: regular rate and rhythm Abdomen: soft, non-tender; bowel sounds normal; no masses,  no organomegaly Extremities: extremities normal, atraumatic, no cyanosis or edema Pulses: 2+ and symmetric Skin: Skin color, texture, turgor normal. No rashes or lesions Neurologic: Grossly normal Psych: Pleasant  EKG: A. fib with RVR 105-personally reviewed  ASSESSMENT: 1. New onset A. fib with RVR-CHADSVASC score of 4 2. Acute systolic congestive heart failure-LVEF 25% (improved to 60-65% in 06/2016) 3. Super morbid obesity, with failure of 3 gastric  bypass procedures 4. Pulmonary hypertension - PA pressure of 64 mmHg, normal LV systolic function 5. Progressive DOE 6. Hypertension-controlled  7. Possible A. fib/ectopic atrial tachycardia-resolved  PLAN: 1.   Ms. Eaton had an unfortunate episode of systolic congestive heart failure likely related to new onset A. fib.  She was treated appropriately in [place] and return.  She is now up 6 pounds and appears volume overloaded.  I recommend increasing her Lasix to 40 mg daily.  In addition we will stop her amlodipine and increase carvedilol to 12.5 mg twice daily for better rate control.  She has been compliant on Xarelto but reported vaginal bleeding.  She will need to see her OB/GYN to further evaluate and possibly treat the cause of her bleeding, before we can consider cardioversion electively.  We will try to obtain records from her hospitalization and if there is no evidence of a repeat echo, we will get the limited echo I previously ordered.  Plan follow-up with me in about 1 month.  Pixie Casino, MD, [facility], [place] Director of the [facility]  Card[facility] Diplomate of the American Board of Clinical Lipidology Attending Cardiologist  Direct Dial: [phone]  Fax: [phone]  Website:  [email or web address] Kentrell Guettler 06/19/2017, 11:34 AM

## 2017-06-20 DIAGNOSIS — Z01411 Encounter for gynecological examination (general) (routine) with abnormal findings: Secondary | ICD-10-CM | POA: Diagnosis not present

## 2017-06-20 DIAGNOSIS — Z1151 Encounter for screening for human papillomavirus (HPV): Secondary | ICD-10-CM | POA: Diagnosis not present

## 2017-06-20 DIAGNOSIS — R319 Hematuria, unspecified: Secondary | ICD-10-CM | POA: Diagnosis not present

## 2017-06-20 DIAGNOSIS — R58 Hemorrhage, not elsewhere classified: Secondary | ICD-10-CM | POA: Diagnosis not present

## 2017-06-20 DIAGNOSIS — Z113 Encounter for screening for infections with a predominantly sexual mode of transmission: Secondary | ICD-10-CM | POA: Diagnosis not present

## 2017-06-20 DIAGNOSIS — Z124 Encounter for screening for malignant neoplasm of cervix: Secondary | ICD-10-CM | POA: Diagnosis not present

## 2017-06-20 LAB — COMPREHENSIVE METABOLIC PANEL
A/G RATIO: 1.3 (ref 1.2–2.2)
ALK PHOS: 117 IU/L (ref 39–117)
ALT: 13 IU/L (ref 0–32)
AST: 15 IU/L (ref 0–40)
Albumin: 3.8 g/dL (ref 3.6–4.8)
BILIRUBIN TOTAL: 0.4 mg/dL (ref 0.0–1.2)
BUN / CREAT RATIO: 24 (ref 12–28)
BUN: 21 mg/dL (ref 8–27)
CHLORIDE: 103 mmol/L (ref 96–106)
CO2: 28 mmol/L (ref 20–29)
Calcium: 6.4 mg/dL — CL (ref 8.7–10.3)
Creatinine, Ser: 0.89 mg/dL (ref 0.57–1.00)
GFR calc non Af Amer: 68 mL/min/{1.73_m2} (ref >59)
GFR, EST AFRICAN AMERICAN: 79 mL/min/{1.73_m2} (ref >59)
GLOBULIN, TOTAL: 3 g/dL (ref 1.5–4.5)
GLUCOSE: 79 mg/dL (ref 65–99)
POTASSIUM: 4.7 mmol/L (ref 3.5–5.2)
SODIUM: 146 mmol/L — AB (ref 134–144)
Total Protein: 6.8 g/dL (ref 6.0–8.5)

## 2017-06-20 LAB — CBC
Hematocrit: 35.9 % (ref 34.0–46.6)
Hemoglobin: 10.4 g/dL — ABNORMAL LOW (ref 11.1–15.9)
MCH: 25.6 pg — AB (ref 26.6–33.0)
MCHC: 29 g/dL — AB (ref 31.5–35.7)
MCV: 88 fL (ref 79–97)
Platelets: 216 10*3/uL (ref 150–379)
RBC: 4.07 x10E6/uL (ref 3.77–5.28)
RDW: 18.1 % — ABNORMAL HIGH (ref 12.3–15.4)
WBC: 4.5 10*3/uL (ref 3.4–10.8)

## 2017-06-20 LAB — TSH: TSH: 0.007 u[IU]/mL — ABNORMAL LOW (ref 0.450–4.500)

## 2017-06-20 LAB — T4, FREE: Free T4: 2.43 ng/dL — ABNORMAL HIGH (ref 0.82–1.77)

## 2017-06-20 LAB — PRO B NATRIURETIC PEPTIDE: NT-Pro BNP: 2308 pg/mL — ABNORMAL HIGH (ref 0–301)

## 2017-06-21 ENCOUNTER — Telehealth: Payer: Self-pay | Admitting: *Deleted

## 2017-06-21 DIAGNOSIS — I5032 Chronic diastolic (congestive) heart failure: Secondary | ICD-10-CM

## 2017-06-21 DIAGNOSIS — R7989 Other specified abnormal findings of blood chemistry: Secondary | ICD-10-CM

## 2017-06-21 DIAGNOSIS — E039 Hypothyroidism, unspecified: Secondary | ICD-10-CM

## 2017-06-21 MED ORDER — LEVOTHYROXINE SODIUM 200 MCG PO TABS: 200.0000 ug | ORAL_TABLET | Freq: Every day | ORAL | 3 refills | Status: DC

## 2017-06-21 MED ORDER — FUROSEMIDE 20 MG PO TABS: 40.0000 mg | ORAL_TABLET | Freq: Two times a day (BID) | ORAL | 11 refills | Status: DC

## 2017-06-21 MED ORDER — APIXABAN 5 MG PO TABS: 5.0000 mg | ORAL_TABLET | Freq: Two times a day (BID) | ORAL | 6 refills | Status: DC

## 2017-06-21 NOTE — Telephone Encounter (Signed)
Left a message for the patient to call back to discuss results and medication changes. Patient will also need to get the limited echo scheduled.

## 2017-06-21 NOTE — Telephone Encounter (Signed)
New message  Pt verbalized that she is returning call for RN  To give instructions on the OBGYN visit and her vaginal bleeding   It has been determined that the bleeding is coming from her urine   Pt is anemic and she is concerned  Want medication changes   She said that she called the manufactures of Xarelto and they confirmed that blood in urine is a causable reaction from the medication

## 2017-06-21 NOTE — Telephone Encounter (Signed)
-----   Message from Pixie Casino, MD sent at 06/20/2017  6:06 PM EDT ----- She is hyperthyroid - probably why she went into a-fib. Decrease levothyroxine from 300 mcg to 200 mcg daily. Please refer to endocrinology (Dr. Cruzita Lederer) for thyroid and hypocalcemia.  Labs also show persistent CHF - increase lasix to 40 mg BID. Check limited echo - regardless of whether she had an echo in [place]. Stress the importance of continuing xarelto without missing a dose and follow-up with GYN for bleeding  DR. H

## 2017-06-21 NOTE — Telephone Encounter (Signed)
Thanks Hilda Blades - ok with the switch to Eliquis.  Dr. Lemmie Evens

## 2017-06-21 NOTE — Telephone Encounter (Signed)
Spoke with pt, aware of lab results and verbally repeated medication changes. New script sent to the pharmacy. Referral placed for dr Cruzita Lederer. Talked with the patient at length about blood thinners and bleeding. Her GYN confirmed the blood is from the urinary tract and they are checking for a UTI. The patient does not want to stay on the Xarelto but would like to switch to another blood thinner. Explained to the patient any blood thinner will cause her to bleed if there is a problem in the urinary tract. She recently saw her urologist and they confirmed she did not have any kidney stones with a CT scan.  Discussed dosage with the pharm md and patient will change to eliquis 5 mg twice daily. Echo order placed and sent to admin for scheduling. Will make dr hilty aware.

## 2017-06-22 ENCOUNTER — Telehealth: Payer: Self-pay | Admitting: Internal Medicine

## 2017-06-22 ENCOUNTER — Other Ambulatory Visit: Payer: Self-pay

## 2017-06-22 ENCOUNTER — Ambulatory Visit (HOSPITAL_COMMUNITY): Payer: Medicare Other | Attending: Cardiology

## 2017-06-22 DIAGNOSIS — I110 Hypertensive heart disease with heart failure: Secondary | ICD-10-CM | POA: Insufficient documentation

## 2017-06-22 DIAGNOSIS — R0600 Dyspnea, unspecified: Secondary | ICD-10-CM | POA: Insufficient documentation

## 2017-06-22 DIAGNOSIS — I4891 Unspecified atrial fibrillation: Secondary | ICD-10-CM | POA: Diagnosis not present

## 2017-06-22 DIAGNOSIS — I5032 Chronic diastolic (congestive) heart failure: Secondary | ICD-10-CM | POA: Diagnosis not present

## 2017-06-22 NOTE — Telephone Encounter (Signed)
Returned call to patient. She would like to see when her cardioversion can be set up. She has an appointment with MD at the end of the month.   Per last MD note:  She has been compliant on Xarelto but reported vaginal bleeding.  She will need to see her OB/GYN to further evaluate and possibly treat the cause of her bleeding, before we can consider cardioversion electively.   Per phone note yesterday, blood is in her urine, no vaginal bleeding identified. She will also be changing from xarelto to eliquis today.   She is aware MD is out of the office until next week but I will follow up with her then.

## 2017-06-22 NOTE — Telephone Encounter (Signed)
New Message   Pt calling to see when he can get scheduled for her Cardia Version. Please call

## 2017-06-25 ENCOUNTER — Telehealth: Payer: Self-pay | Admitting: Internal Medicine

## 2017-06-25 NOTE — Telephone Encounter (Signed)
Received records from [facility] on 06/21/17, Appt 07/20/17 @ 10:45AM

## 2017-06-25 NOTE — Telephone Encounter (Signed)
She will need a minimum of 3 weeks anticoagulation on Eliquis without missed doses before we can schedule cardioversion. Also, I would like to see if her thyroid is better regulated by labwork again before we cardiovert her, otherwise, it may not be successful.  Dr. Lemmie Evens

## 2017-06-26 ENCOUNTER — Other Ambulatory Visit: Payer: Self-pay | Admitting: Obstetrics and Gynecology

## 2017-06-26 DIAGNOSIS — N950 Postmenopausal bleeding: Secondary | ICD-10-CM

## 2017-06-26 NOTE — Telephone Encounter (Signed)
Patient called w/MD recommendations. She agrees w/plan. She also states she has no urinary bleeding since changing from xarelto to eliquis. She has not heard from endocrinologist yet - will message scheduler.

## 2017-06-27 NOTE — Telephone Encounter (Signed)
Per Deedie (scheduler), she has contact Dr. Arman Filter office who has reviewed the patient's referral info and will be contacting her to schedule an office visit. Patient is aware of this.

## 2017-06-29 ENCOUNTER — Other Ambulatory Visit: Payer: Medicare Other

## 2017-07-03 ENCOUNTER — Ambulatory Visit: Payer: Federal, State, Local not specified - PPO | Admitting: Adult Health

## 2017-07-03 DIAGNOSIS — H2513 Age-related nuclear cataract, bilateral: Secondary | ICD-10-CM | POA: Diagnosis not present

## 2017-07-03 DIAGNOSIS — H532 Diplopia: Secondary | ICD-10-CM | POA: Diagnosis not present

## 2017-07-03 DIAGNOSIS — H3509 Other intraretinal microvascular abnormalities: Secondary | ICD-10-CM | POA: Diagnosis not present

## 2017-07-03 DIAGNOSIS — H35033 Hypertensive retinopathy, bilateral: Secondary | ICD-10-CM | POA: Diagnosis not present

## 2017-07-04 DIAGNOSIS — I482 Chronic atrial fibrillation: Secondary | ICD-10-CM | POA: Diagnosis not present

## 2017-07-04 DIAGNOSIS — R0609 Other forms of dyspnea: Secondary | ICD-10-CM | POA: Diagnosis not present

## 2017-07-04 DIAGNOSIS — H538 Other visual disturbances: Secondary | ICD-10-CM | POA: Diagnosis not present

## 2017-07-04 DIAGNOSIS — Z136 Encounter for screening for cardiovascular disorders: Secondary | ICD-10-CM | POA: Diagnosis not present

## 2017-07-04 DIAGNOSIS — E890 Postprocedural hypothyroidism: Secondary | ICD-10-CM | POA: Diagnosis not present

## 2017-07-04 DIAGNOSIS — Z131 Encounter for screening for diabetes mellitus: Secondary | ICD-10-CM | POA: Diagnosis not present

## 2017-07-04 DIAGNOSIS — Z7901 Long term (current) use of anticoagulants: Secondary | ICD-10-CM | POA: Diagnosis not present

## 2017-07-04 DIAGNOSIS — I509 Heart failure, unspecified: Secondary | ICD-10-CM | POA: Diagnosis not present

## 2017-07-04 DIAGNOSIS — I10 Essential (primary) hypertension: Secondary | ICD-10-CM | POA: Diagnosis not present

## 2017-07-04 DIAGNOSIS — R42 Dizziness and giddiness: Secondary | ICD-10-CM | POA: Diagnosis not present

## 2017-07-05 ENCOUNTER — Ambulatory Visit
Admission: RE | Admit: 2017-07-05 | Discharge: 2017-07-05 | Disposition: A | Discharge status: Home / Self Care | Payer: Medicare Other | Source: Ambulatory Visit | Attending: Obstetrics and Gynecology | Admitting: Obstetrics and Gynecology

## 2017-07-05 DIAGNOSIS — N950 Postmenopausal bleeding: Secondary | ICD-10-CM

## 2017-07-08 ENCOUNTER — Telehealth: Payer: Self-pay | Admitting: Physician Assistant

## 2017-07-08 DIAGNOSIS — H538 Other visual disturbances: Secondary | ICD-10-CM | POA: Insufficient documentation

## 2017-07-08 DIAGNOSIS — R42 Dizziness and giddiness: Secondary | ICD-10-CM | POA: Insufficient documentation

## 2017-07-08 NOTE — Telephone Encounter (Signed)
Paged by answering service. She went into afib this morning. HR in 90-105s range. No chest pain . Feels palpitation and shortness of breath. Mild orthopnea but uses oxygen. No regular weight check. Advised to take morning dose of coreg and eliquis. She takes lasix 40mg  qd, advised take that as well. Avoid caffeine. She will call us back tomorrow to review symptoms. If worsen, she will go to ER.

## 2017-07-09 ENCOUNTER — Telehealth: Payer: Self-pay | Admitting: Internal Medicine

## 2017-07-09 NOTE — Telephone Encounter (Signed)
Returned call to patient of Dr. [name] who reports SOB, swelling, fatigue, fast HR. She states her SOB has been going on for a few months and is worse than it was at her last visit with MD. She does not weigh herself but notices swelling in her legs, "water weight" in her hands, right arm, right side. She reports her HR was elevated on Friday. She has the means to monitor BP and HR at home but is not doing so consistently. Advised that patient take extra dose of lasix this afternoon and I will call her tomorrow with additional recommendations from MD. Advised ED eval for worsening symptoms, as patient did not sound like herself on the phone.   Routed to MD

## 2017-07-09 NOTE — Telephone Encounter (Signed)
New message    Pt is calling asking if she needs a sooner appt than 5/31 because she is having swelling, fatigue, and increased HR.   Pt c/o swelling: STAT is pt has developed SOB within 24 hours  1) How much weight have you gained and in what time span? Pt doesn't have a scale.  2) If swelling, where is the swelling located? Right side. Hand and leg.  3) Are you currently taking a fluid pill? Yes, 40 mg.  4) Are you currently SOB? Yes, can not hear on phone. Pt said it's been going on for about a month and it's gotten worse over the last couple of days.  5) Do you have a log of your daily weights (if so, list)? no  6) Have you gained 3 pounds in a day or 5 pounds in a week?   7) Have you traveled recently? No.  Please call.

## 2017-07-10 ENCOUNTER — Inpatient Hospital Stay (HOSPITAL_COMMUNITY)
Admission: EM | Admit: 2017-07-10 | Discharge: 2017-07-17 | DRG: 291 | Disposition: A | Discharge status: Skilled Nursing Facility | Payer: Medicare Other | Attending: Internal Medicine | Admitting: Internal Medicine

## 2017-07-10 ENCOUNTER — Emergency Department (HOSPITAL_COMMUNITY): Payer: Medicare Other

## 2017-07-10 DIAGNOSIS — D638 Anemia in other chronic diseases classified elsewhere: Secondary | ICD-10-CM | POA: Diagnosis not present

## 2017-07-10 DIAGNOSIS — E538 Deficiency of other specified B group vitamins: Secondary | ICD-10-CM | POA: Diagnosis present

## 2017-07-10 DIAGNOSIS — I5033 Acute on chronic diastolic (congestive) heart failure: Secondary | ICD-10-CM | POA: Diagnosis not present

## 2017-07-10 DIAGNOSIS — J45909 Unspecified asthma, uncomplicated: Secondary | ICD-10-CM | POA: Diagnosis present

## 2017-07-10 DIAGNOSIS — I4891 Unspecified atrial fibrillation: Secondary | ICD-10-CM | POA: Diagnosis present

## 2017-07-10 DIAGNOSIS — M797 Fibromyalgia: Secondary | ICD-10-CM | POA: Diagnosis present

## 2017-07-10 DIAGNOSIS — J449 Chronic obstructive pulmonary disease, unspecified: Secondary | ICD-10-CM | POA: Diagnosis present

## 2017-07-10 DIAGNOSIS — Z7982 Long term (current) use of aspirin: Secondary | ICD-10-CM | POA: Diagnosis not present

## 2017-07-10 DIAGNOSIS — I272 Pulmonary hypertension, unspecified: Secondary | ICD-10-CM | POA: Diagnosis not present

## 2017-07-10 DIAGNOSIS — Z7901 Long term (current) use of anticoagulants: Secondary | ICD-10-CM

## 2017-07-10 DIAGNOSIS — J9622 Acute and chronic respiratory failure with hypercapnia: Secondary | ICD-10-CM | POA: Diagnosis present

## 2017-07-10 DIAGNOSIS — I5032 Chronic diastolic (congestive) heart failure: Secondary | ICD-10-CM | POA: Diagnosis not present

## 2017-07-10 DIAGNOSIS — R0689 Other abnormalities of breathing: Secondary | ICD-10-CM

## 2017-07-10 DIAGNOSIS — Z9981 Dependence on supplemental oxygen: Secondary | ICD-10-CM | POA: Diagnosis not present

## 2017-07-10 DIAGNOSIS — R319 Hematuria, unspecified: Secondary | ICD-10-CM | POA: Diagnosis not present

## 2017-07-10 DIAGNOSIS — F339 Major depressive disorder, recurrent, unspecified: Secondary | ICD-10-CM | POA: Diagnosis not present

## 2017-07-10 DIAGNOSIS — R5383 Other fatigue: Secondary | ICD-10-CM | POA: Diagnosis not present

## 2017-07-10 DIAGNOSIS — D696 Thrombocytopenia, unspecified: Secondary | ICD-10-CM | POA: Diagnosis present

## 2017-07-10 DIAGNOSIS — D693 Immune thrombocytopenic purpura: Secondary | ICD-10-CM | POA: Diagnosis not present

## 2017-07-10 DIAGNOSIS — G4730 Sleep apnea, unspecified: Secondary | ICD-10-CM | POA: Diagnosis not present

## 2017-07-10 DIAGNOSIS — R0602 Shortness of breath: Secondary | ICD-10-CM | POA: Diagnosis not present

## 2017-07-10 DIAGNOSIS — Z7401 Bed confinement status: Secondary | ICD-10-CM | POA: Diagnosis not present

## 2017-07-10 DIAGNOSIS — Z79899 Other long term (current) drug therapy: Secondary | ICD-10-CM

## 2017-07-10 DIAGNOSIS — I5031 Acute diastolic (congestive) heart failure: Secondary | ICD-10-CM | POA: Diagnosis present

## 2017-07-10 DIAGNOSIS — D649 Anemia, unspecified: Secondary | ICD-10-CM | POA: Diagnosis not present

## 2017-07-10 DIAGNOSIS — I270 Primary pulmonary hypertension: Secondary | ICD-10-CM | POA: Diagnosis not present

## 2017-07-10 DIAGNOSIS — J9620 Acute and chronic respiratory failure, unspecified whether with hypoxia or hypercapnia: Secondary | ICD-10-CM | POA: Diagnosis not present

## 2017-07-10 DIAGNOSIS — E662 Morbid (severe) obesity with alveolar hypoventilation: Secondary | ICD-10-CM | POA: Diagnosis present

## 2017-07-10 DIAGNOSIS — K219 Gastro-esophageal reflux disease without esophagitis: Secondary | ICD-10-CM | POA: Diagnosis not present

## 2017-07-10 DIAGNOSIS — I481 Persistent atrial fibrillation: Secondary | ICD-10-CM | POA: Diagnosis present

## 2017-07-10 DIAGNOSIS — J4489 Other specified chronic obstructive pulmonary disease: Secondary | ICD-10-CM | POA: Diagnosis present

## 2017-07-10 DIAGNOSIS — N179 Acute kidney failure, unspecified: Secondary | ICD-10-CM | POA: Diagnosis not present

## 2017-07-10 DIAGNOSIS — E890 Postprocedural hypothyroidism: Secondary | ICD-10-CM | POA: Diagnosis present

## 2017-07-10 DIAGNOSIS — I482 Chronic atrial fibrillation, unspecified: Secondary | ICD-10-CM | POA: Diagnosis present

## 2017-07-10 DIAGNOSIS — Z9884 Bariatric surgery status: Secondary | ICD-10-CM

## 2017-07-10 DIAGNOSIS — I361 Nonrheumatic tricuspid (valve) insufficiency: Secondary | ICD-10-CM | POA: Diagnosis not present

## 2017-07-10 DIAGNOSIS — J9621 Acute and chronic respiratory failure with hypoxia: Secondary | ICD-10-CM | POA: Diagnosis not present

## 2017-07-10 DIAGNOSIS — I493 Ventricular premature depolarization: Secondary | ICD-10-CM | POA: Diagnosis present

## 2017-07-10 DIAGNOSIS — G4700 Insomnia, unspecified: Secondary | ICD-10-CM | POA: Diagnosis not present

## 2017-07-10 DIAGNOSIS — E039 Hypothyroidism, unspecified: Secondary | ICD-10-CM | POA: Diagnosis not present

## 2017-07-10 DIAGNOSIS — J9600 Acute respiratory failure, unspecified whether with hypoxia or hypercapnia: Secondary | ICD-10-CM

## 2017-07-10 DIAGNOSIS — I110 Hypertensive heart disease with heart failure: Secondary | ICD-10-CM | POA: Diagnosis not present

## 2017-07-10 DIAGNOSIS — J45998 Other asthma: Secondary | ICD-10-CM | POA: Diagnosis not present

## 2017-07-10 DIAGNOSIS — M2550 Pain in unspecified joint: Secondary | ICD-10-CM | POA: Diagnosis not present

## 2017-07-10 DIAGNOSIS — M6281 Muscle weakness (generalized): Secondary | ICD-10-CM | POA: Diagnosis not present

## 2017-07-10 DIAGNOSIS — D509 Iron deficiency anemia, unspecified: Secondary | ICD-10-CM | POA: Diagnosis present

## 2017-07-10 DIAGNOSIS — I480 Paroxysmal atrial fibrillation: Secondary | ICD-10-CM | POA: Diagnosis not present

## 2017-07-10 DIAGNOSIS — Z6841 Body Mass Index (BMI) 40.0 and over, adult: Secondary | ICD-10-CM

## 2017-07-10 DIAGNOSIS — R609 Edema, unspecified: Secondary | ICD-10-CM | POA: Diagnosis not present

## 2017-07-10 LAB — I-STAT TROPONIN, ED: Troponin i, poc: 0 ng/mL (ref 0.00–0.08)

## 2017-07-10 LAB — I-STAT ARTERIAL BLOOD GAS, ED
ACID-BASE EXCESS: 6 mmol/L — AB (ref 0.0–2.0)
Acid-Base Excess: 6 mmol/L — ABNORMAL HIGH (ref 0.0–2.0)
BICARBONATE: 34 mmol/L — AB (ref 20.0–28.0)
Bicarbonate: 34.3 mmol/L — ABNORMAL HIGH (ref 20.0–28.0)
O2 SAT: 94 %
O2 Saturation: 92 %
PCO2 ART: 66.9 mmHg — AB (ref 32.0–48.0)
PH ART: 7.302 — AB (ref 7.350–7.450)
PH ART: 7.318 — AB (ref 7.350–7.450)
Patient temperature: 98.4
TCO2: 36 mmol/L — AB (ref 22–32)
TCO2: 36 mmol/L — ABNORMAL HIGH (ref 22–32)
pCO2 arterial: 68.8 mmHg (ref 32.0–48.0)
pO2, Arterial: 74 mmHg — ABNORMAL LOW (ref 83.0–108.0)
pO2, Arterial: 80 mmHg — ABNORMAL LOW (ref 83.0–108.0)

## 2017-07-10 LAB — HEPATIC FUNCTION PANEL
ALT: 10 U/L — ABNORMAL LOW (ref 14–54)
AST: 15 U/L (ref 15–41)
Albumin: 3 g/dL — ABNORMAL LOW (ref 3.5–5.0)
Alkaline Phosphatase: 92 U/L (ref 38–126)
BILIRUBIN DIRECT: 0.2 mg/dL (ref 0.1–0.5)
BILIRUBIN INDIRECT: 0.6 mg/dL (ref 0.3–0.9)
BILIRUBIN TOTAL: 0.8 mg/dL (ref 0.3–1.2)
Total Protein: 6.4 g/dL — ABNORMAL LOW (ref 6.5–8.1)

## 2017-07-10 LAB — CBC WITH DIFFERENTIAL/PLATELET
ABS IMMATURE GRANULOCYTES: 0 10*3/uL (ref 0.0–0.1)
Basophils Absolute: 0 10*3/uL (ref 0.0–0.1)
Basophils Relative: 0 %
EOS ABS: 0.2 10*3/uL (ref 0.0–0.7)
EOS PCT: 4 %
HEMATOCRIT: 32.8 % — AB (ref 36.0–46.0)
Hemoglobin: 9.7 g/dL — ABNORMAL LOW (ref 12.0–15.0)
IMMATURE GRANULOCYTES: 0 %
Lymphocytes Relative: 16 %
Lymphs Abs: 0.8 10*3/uL (ref 0.7–4.0)
MCH: 25.2 pg — ABNORMAL LOW (ref 26.0–34.0)
MCHC: 29.6 g/dL — ABNORMAL LOW (ref 30.0–36.0)
MCV: 85.2 fL (ref 78.0–100.0)
MONO ABS: 0.6 10*3/uL (ref 0.1–1.0)
MONOS PCT: 11 %
NEUTROS PCT: 69 %
Neutro Abs: 3.6 10*3/uL (ref 1.7–7.7)
Platelets: 137 10*3/uL — ABNORMAL LOW (ref 150–400)
RBC: 3.85 MIL/uL — AB (ref 3.87–5.11)
RDW: 16.8 % — AB (ref 11.5–15.5)
WBC: 5.2 10*3/uL (ref 4.0–10.5)

## 2017-07-10 LAB — BASIC METABOLIC PANEL
ANION GAP: 13 (ref 5–15)
BUN: 32 mg/dL — ABNORMAL HIGH (ref 6–20)
CALCIUM: 5.5 mg/dL — AB (ref 8.9–10.3)
CO2: 31 mmol/L (ref 22–32)
Chloride: 100 mmol/L — ABNORMAL LOW (ref 101–111)
Creatinine, Ser: 1.54 mg/dL — ABNORMAL HIGH (ref 0.44–1.00)
GFR, EST AFRICAN AMERICAN: 40 mL/min — AB (ref >60)
GFR, EST NON AFRICAN AMERICAN: 34 mL/min — AB (ref >60)
Glucose, Bld: 93 mg/dL (ref 65–99)
POTASSIUM: 4.6 mmol/L (ref 3.5–5.1)
Sodium: 144 mmol/L (ref 135–145)

## 2017-07-10 LAB — MAGNESIUM: Magnesium: 1.2 mg/dL — ABNORMAL LOW (ref 1.7–2.4)

## 2017-07-10 LAB — PHOSPHORUS: Phosphorus: 7.4 mg/dL — ABNORMAL HIGH (ref 2.5–4.6)

## 2017-07-10 LAB — BRAIN NATRIURETIC PEPTIDE: B NATRIURETIC PEPTIDE 5: 246.2 pg/mL — AB (ref 0.0–100.0)

## 2017-07-10 MED ORDER — ZOLPIDEM TARTRATE 5 MG PO TABS
5.0000 mg | ORAL_TABLET | Freq: Every evening | ORAL | Status: DC | PRN
  Administered 2017-07-11 – 2017-07-16 (×6): 5 mg via ORAL
  Filled 2017-07-10 (×6): qty 1

## 2017-07-10 MED ORDER — ONDANSETRON HCL 4 MG PO TABS: 4.0000 mg | ORAL_TABLET | Freq: Four times a day (QID) | ORAL | Status: DC | PRN

## 2017-07-10 MED ORDER — MIRABEGRON ER 25 MG PO TB24
50.0000 mg | ORAL_TABLET | Freq: Every day | ORAL | Status: DC
  Administered 2017-07-12 – 2017-07-17 (×6): 50 mg via ORAL
  Filled 2017-07-10 (×6): qty 2
  Filled 2017-07-10: qty 1
  Filled 2017-07-10: qty 2

## 2017-07-10 MED ORDER — ACETAMINOPHEN 325 MG PO TABS
650.0000 mg | ORAL_TABLET | Freq: Four times a day (QID) | ORAL | Status: DC | PRN
  Administered 2017-07-11 – 2017-07-12 (×2): 650 mg via ORAL
  Filled 2017-07-10 (×2): qty 2

## 2017-07-10 MED ORDER — ACETAMINOPHEN 650 MG RE SUPP: 650.0000 mg | Freq: Four times a day (QID) | RECTAL | Status: DC | PRN

## 2017-07-10 MED ORDER — SODIUM CHLORIDE 0.9% FLUSH: 3.0000 mL | INTRAVENOUS | Status: DC | PRN

## 2017-07-10 MED ORDER — PREGABALIN 100 MG PO CAPS
200.0000 mg | ORAL_CAPSULE | Freq: Three times a day (TID) | ORAL | Status: DC
  Administered 2017-07-11 – 2017-07-17 (×17): 200 mg via ORAL
  Filled 2017-07-10 (×17): qty 2

## 2017-07-10 MED ORDER — SENNOSIDES-DOCUSATE SODIUM 8.6-50 MG PO TABS: 1.0000 | ORAL_TABLET | Freq: Every evening | ORAL | Status: DC | PRN

## 2017-07-10 MED ORDER — SODIUM CHLORIDE 0.9% FLUSH
3.0000 mL | Freq: Two times a day (BID) | INTRAVENOUS | Status: DC
  Administered 2017-07-11 – 2017-07-17 (×7): 3 mL via INTRAVENOUS

## 2017-07-10 MED ORDER — BISACODYL 5 MG PO TBEC: 5.0000 mg | DELAYED_RELEASE_TABLET | Freq: Every day | ORAL | Status: DC | PRN

## 2017-07-10 MED ORDER — DULOXETINE HCL 60 MG PO CPEP
60.0000 mg | ORAL_CAPSULE | Freq: Every day | ORAL | Status: DC
  Administered 2017-07-12 – 2017-07-17 (×6): 60 mg via ORAL
  Filled 2017-07-10 (×7): qty 1

## 2017-07-10 MED ORDER — HYDROCODONE-ACETAMINOPHEN 5-325 MG PO TABS: 1.0000 | ORAL_TABLET | ORAL | Status: DC | PRN

## 2017-07-10 MED ORDER — ALBUTEROL SULFATE (2.5 MG/3ML) 0.083% IN NEBU: 2.5000 mg | INHALATION_SOLUTION | RESPIRATORY_TRACT | Status: DC | PRN

## 2017-07-10 MED ORDER — APIXABAN 5 MG PO TABS
5.0000 mg | ORAL_TABLET | Freq: Two times a day (BID) | ORAL | Status: DC
  Administered 2017-07-11 – 2017-07-17 (×13): 5 mg via ORAL
  Filled 2017-07-10 (×15): qty 1

## 2017-07-10 MED ORDER — LEVOTHYROXINE SODIUM 200 MCG PO TABS
200.0000 ug | ORAL_TABLET | Freq: Every day | ORAL | Status: DC
  Administered 2017-07-12 – 2017-07-17 (×6): 200 ug via ORAL
  Filled 2017-07-10: qty 1
  Filled 2017-07-10 (×2): qty 2
  Filled 2017-07-10 (×2): qty 1
  Filled 2017-07-10: qty 2
  Filled 2017-07-10: qty 1
  Filled 2017-07-10: qty 2
  Filled 2017-07-10: qty 1
  Filled 2017-07-10 (×3): qty 2
  Filled 2017-07-10: qty 1

## 2017-07-10 MED ORDER — SODIUM CHLORIDE 0.9 % IV SOLN
1.0000 g | Freq: Once | INTRAVENOUS | Status: AC
  Administered 2017-07-10: 1 g via INTRAVENOUS
  Filled 2017-07-10: qty 10

## 2017-07-10 MED ORDER — SODIUM CHLORIDE 0.9% FLUSH
3.0000 mL | Freq: Two times a day (BID) | INTRAVENOUS | Status: DC
  Administered 2017-07-12 – 2017-07-17 (×11): 3 mL via INTRAVENOUS

## 2017-07-10 MED ORDER — ONDANSETRON HCL 4 MG/2ML IJ SOLN
4.0000 mg | Freq: Four times a day (QID) | INTRAMUSCULAR | Status: DC | PRN
  Filled 2017-07-10: qty 2

## 2017-07-10 MED ORDER — MOMETASONE FURO-FORMOTEROL FUM 200-5 MCG/ACT IN AERO
2.0000 | INHALATION_SPRAY | Freq: Two times a day (BID) | RESPIRATORY_TRACT | Status: DC
  Administered 2017-07-12 – 2017-07-13 (×2): 2 via RESPIRATORY_TRACT
  Filled 2017-07-10 (×2): qty 8.8

## 2017-07-10 MED ORDER — SODIUM CHLORIDE 0.9 % IV SOLN: 250.0000 mL | INTRAVENOUS | Status: DC | PRN

## 2017-07-10 MED ORDER — CARVEDILOL 12.5 MG PO TABS
12.5000 mg | ORAL_TABLET | Freq: Two times a day (BID) | ORAL | Status: DC
  Administered 2017-07-11 – 2017-07-12 (×2): 12.5 mg via ORAL
  Filled 2017-07-10 (×5): qty 1

## 2017-07-10 MED ORDER — CYCLOBENZAPRINE HCL 10 MG PO TABS: 10.0000 mg | ORAL_TABLET | Freq: Three times a day (TID) | ORAL | Status: DC

## 2017-07-10 NOTE — ED Provider Notes (Signed)
[facility] EMERGENCY DEPARTMENT Provider Note   CSN: [id number] Arrival date & time: 07/10/17  1647     History   Chief Complaint Chief Complaint  Patient presents with  . Shortness of Breath    HPI Jillian Eaton is a 67 y.o. female.  The history is provided by the patient and medical records. No language interpreter was used.  Shortness of Breath  Associated symptoms include leg swelling. Pertinent negatives include no fever, no chest pain, no vomiting and no abdominal pain.   Jillian Eaton is a 67 y.o. female  with a PMH of CHF, HTN, afib on eliquis who presents to the Emergency Department complaining of progressively worsening shortness of breath. She states that she has been feeling a little more short of breath over the several weeks. Over the last couple of days, she has felt like her breathing is much worse and noticed worsening lower extremity swelling as well. She took an extra dose of her lasix last night. No other medications taken prior to arrival for symptoms. She denies any chest pain. No cough, congestion, fever. No abdominal pain, n/v. She does report using home oxygen "as needed". She always wears 2L at night and then uses it when she exerts herself as well. She typically is able to walk from her living room to the car without any difficulty. Over the last week, she will get short of breath just walking from the couch to the bathroom. This has been a big change from her baseline.    Past Medical History:  Diagnosis Date  . Anemia   . Asthma   . Chronic headache   . DOE (dyspnea on exertion)    2D ECHO, 02/12/2012 - EF 60-65%, moderate concentric hypertrophy  . Fibromyalgia    nerve pain"left side at waist level" "can't lay on that side without pain" , "HOB elevation helps"  . Heart murmur   . Hematuria - cause not known   . History of kidney stones    x 2 '13, '14 surgery to remove  . Hypertension   . SBO (small bowel obstruction)  ([place]) 06/07/2013  . SOB (shortness of breath)    NUCLEAR STRESS TEST, 03/02/2009 - no ischemic ST changes or symptoms  . Thyroid disease    "goiter"  . Transfusion history    10 yrs+  . Urinary incontinence, functional     Patient Active Problem List   Diagnosis Date Noted  . AKI (acute kidney injury) ([place]) 07/10/2017  . On anticoagulant therapy 06/19/2017  . GERD (gastroesophageal reflux disease) 08/28/2016  . Hypothyroidism 08/28/2016  . Depression 08/28/2016  . Hypocalcemia 08/28/2016  . Chronic pain syndrome   . Fibromyalgia   . Asthma   . Acute on chronic diastolic CHF (congestive heart failure) ([place])   . Atrial fibrillation ([place]) 04/28/2016  . Vocal cord dysfunction 04/28/2016  . Thrombocytopenia ([place]) 04/28/2016  . Seizures ([place])   . Leg swelling 10/19/2015  . Acute on chronic respiratory failure with hypoxia and hypercapnia ([place]) 08/03/2015  . Bilateral leg edema 05/11/2015  . Essential hypertension 05/11/2015  . Pulmonary hypertension ([place]) 01/08/2013  . Anemia of chronic disease 03/28/2011  . Morbid obesity due to excess calories ([place]) 09/22/2010    Past Surgical History:  Procedure Laterality Date  . CARDIAC CATHETERIZATION  04/04/2010   No significant obstructive coronary artery disease  . CHOLECYSTECTOMY  1990  . COLONOSCOPY W/ POLYPECTOMY    . COLONOSCOPY WITH PROPOFOL N/A 04/10/2014  Procedure: COLONOSCOPY WITH PROPOFOL;  Surgeon: Beryle Beams, MD;  Location: [facility] ENDOSCOPY;  Service: Endoscopy;  Laterality: N/A;  . DIAGNOSTIC LAPAROSCOPY     x2 bowel obstructions(adhesions)  . 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 THYROIDECTOMY    . TUBAL LIGATION  1986     OB History   None      Home Medications    Prior to Admission medications   Medication Sig Start Date End Date Taking? Authorizing Provider  acetaminophen (TYLENOL) 650 MG CR tablet Take 650 mg by mouth 2 (two) times daily.    Yes [provider]  apixaban (ELIQUIS) 5 MG TABS tablet Take 1 tablet (5 mg total) by mouth 2 (two) times daily. 06/21/17  Yes Hilty, Nadean Corwin, MD  budesonide-formoterol (SYMBICORT) 160-4.5 MCG/ACT inhaler Inhale 2 puffs into the lungs 2 (two) times daily as needed (shortness of breath.).    Yes Tanda Rockers, MD  aspirin EC 81 MG EC tablet Take 1 tablet (81 mg total) by mouth daily. Patient not taking: Reported on 07/10/2017 05/03/16   Rama, Venetia Maxon, MD  carvedilol (COREG) 12.5 MG tablet Take 1 tablet (12.5 mg total) by mouth 2 (two) times daily. 06/19/17   Hilty, Nadean Corwin, MD  cyclobenzaprine (FLEXERIL) 10 MG tablet Take 1 tablet by mouth 3 (three) times daily. 04/20/16   [provider]  DULoxetine (CYMBALTA) 60 MG capsule TAKE 1 CAPSULE BY MOUTH ONCE DAILY 08/21/16   [provider]  Eszopiclone 3 MG TABS TAKE 1 TABLET BY MOUTH NIGHTLY IMMEDIATELY BEFORE BEDTIME 08/17/16   [provider]  furosemide (LASIX) 20 MG tablet Take 2 tablets (40 mg total) by mouth 2 (two) times daily. 06/21/17   Hilty, Nadean Corwin, MD  levalbuterol (XOPENEX) 0.63 MG/3ML nebulizer solution Take 3 mLs (0.63 mg total) by nebulization every 4 (four) hours as needed for wheezing or shortness of breath. 05/02/16   Rama, Venetia Maxon, MD  levothyroxine (SYNTHROID, LEVOTHROID) 200 MCG tablet Take 1 tablet (200 mcg total) by mouth daily before breakfast. 06/21/17   Hilty, Nadean Corwin, MD  LYRICA 200 MG capsule Take 200 mg by mouth 3 (three) times daily. 08/09/16   [provider]  mirabegron ER (MYRBETRIQ) 50 MG TB24 tablet Take 50 mg by mouth daily.    [provider]  OXYGEN Inhale 2 L/min into the lungs continuous.    [provider]  Vitamin D, Ergocalciferol, (DRISDOL) 50000 units CAPS capsule Take 50,000 Units by mouth every 7 (seven) days.    [provider]    Family History Family History  Problem Relation Age of Onset  . Diabetes Mother   . Epilepsy Mother   .  Cancer Mother        Breast  . Hypertension Mother   . Breast cancer Mother   . Kidney disease Father   . Diabetes Father   . Hypertension Father   . Asthma Father   . Heart disease Father   . Epilepsy Sister   . Cancer Maternal Grandmother   . Breast cancer Maternal Grandmother   . Cancer Paternal Grandmother   . Breast cancer Paternal Grandmother     Social History Social History   Tobacco Use  . Smoking status: Never Smoker  . Smokeless tobacco: Never Used  Substance Use Topics  . Alcohol use: No    Comment: wine occ  . Drug use: No  Types: Oxycodone    Comment: perscribed     Allergies   Norco [hydrocodone-acetaminophen]   Review of Systems Review of Systems  Constitutional: Positive for fatigue. Negative for chills and fever.  HENT: Negative for congestion.   Respiratory: Positive for shortness of breath.   Cardiovascular: Positive for leg swelling. Negative for chest pain.  Gastrointestinal: Negative for abdominal pain, nausea and vomiting.  All other systems reviewed and are negative.    Physical Exam Updated Vital Signs BP 97/76   Pulse 96   Temp 98.4 F (36.9 C) (Oral)   Resp 15   Ht 5\' 7"  (1.702 m)   SpO2 96%   BMI 54.19 kg/m   Physical Exam  Constitutional: She is oriented to person, place, and time. She appears well-developed and well-nourished. No distress.  HENT:  Head: Normocephalic and atraumatic.  Cardiovascular: Normal rate, regular rhythm and normal heart sounds.  No murmur heard. Pulmonary/Chest: Effort normal. No respiratory distress.  Crackles to bilateral lung fields. No wheezing.  Abdominal: Soft. She exhibits no distension. There is no tenderness.  Musculoskeletal: She exhibits edema.  Neurological: She is alert and oriented to person, place, and time.  Skin: Skin is warm and dry.  Nursing note and vitals reviewed.    ED Treatments / Results  Labs (all labs ordered are listed, but only abnormal results are  displayed) Labs Reviewed  CBC WITH DIFFERENTIAL/PLATELET - Abnormal; Notable for the following components:      Result Value   RBC 3.85 (*)    Hemoglobin 9.7 (*)    HCT 32.8 (*)    MCH 25.2 (*)    MCHC 29.6 (*)    RDW 16.8 (*)    Platelets 137 (*)    All other components within normal limits  BASIC METABOLIC PANEL - Abnormal; Notable for the following components:   Chloride 100 (*)    BUN 32 (*)    Creatinine, Ser 1.54 (*)    Calcium 5.5 (*)    GFR calc non Af Amer 34 (*)    GFR calc Af Amer 40 (*)    All other components within normal limits  BRAIN NATRIURETIC PEPTIDE - Abnormal; Notable for the following components:   B Natriuretic Peptide 246.2 (*)    All other components within normal limits  I-STAT ARTERIAL BLOOD GAS, ED - Abnormal; Notable for the following components:   pH, Arterial 7.302 (*)    pCO2 arterial 68.8 (*)    pO2, Arterial 74.0 (*)    Bicarbonate 34.0 (*)    TCO2 36 (*)    Acid-Base Excess 6.0 (*)    All other components within normal limits  HEPATIC FUNCTION PANEL  MAGNESIUM  PTH, INTACT AND CALCIUM  PHOSPHORUS  SODIUM, URINE, RANDOM  UREA NITROGEN, URINE  CREATININE, URINE, RANDOM  TSH  T4, FREE  I-STAT TROPONIN, ED    EKG EKG Interpretation  Date/Time:  Tuesday Jul 10 2017 16:53:19 EDT Ventricular Rate:  86 PR Interval:    QRS Duration: 100 QT Interval:  415 QTC Calculation: 497 R Axis:   78 Text Interpretation:  Atrial fibrillation Ventricular premature complex Abnrm T, consider ischemia, anterolateral lds Baseline wander in lead(s) V6 Since last EKG, TWI in V2, V3 is new Otherwise no significant change Confirmed by Duffy Bruce [phone]) on 07/10/2017 5:24:02 PM   Radiology Dg Chest 2 View  Result Date: 07/10/2017 CLINICAL DATA:  Shortness of breath EXAM: CHEST - 2 VIEW COMPARISON:  April 19, 2017 FINDINGS: There is  cardiomegaly with pulmonary venous hypertension. There is bibasilar atelectasis. There is no frank edema or  consolidation. No adenopathy. There is degenerative change in the thoracic spine. IMPRESSION: Pulmonary vascular congestion without edema or consolidation. Electronically Signed   By: Lowella Grip III M.D.   On: 07/10/2017 17:44    Procedures Procedures (including critical care time)  CRITICAL CARE Performed by: Ozella Almond Ward   Total critical care time: 35 minutes  Critical care time was exclusive of separately billable procedures and treating other patients.  Critical care was necessary to treat or prevent imminent or life-threatening deterioration.  Critical care was time spent personally by me on the following activities: development of treatment plan with patient and/or surrogate as well as nursing, discussions with consultants, evaluation of patient's response to treatment, examination of patient, obtaining history from patient or surrogate, ordering and performing treatments and interventions, ordering and review of laboratory studies, ordering and review of radiographic studies, pulse oximetry and re-evaluation of patient's condition.   Medications Ordered in ED Medications  albuterol (PROVENTIL) (2.5 MG/3ML) 0.083% nebulizer solution 2.5 mg (has no administration in time range)  calcium gluconate 1 g in sodium chloride 0.9 % 100 mL IVPB (1 g Intravenous New Bag/Given 07/10/17 1948)     Initial Impression / Assessment and Plan / ED Course  I have reviewed the triage vital signs and the nursing notes.  Pertinent labs & imaging results that were available during my care of the patient were reviewed by me and considered in my medical decision making (see chart for details).    Jillian Eaton is a 67 y.o. female who presents to ED for shortness of breath. On exam, patient is afebrile, crackles to bilateral lung fields and lower extremity edema. She is on 2L o2 [place] during initial exam and maintain o2 at 92-95%. Hx of CHF - BNP 246.2 (typically normal).  CXR with pulmonary  vascular congestion. No PNA. Drowsy on repeat exam, endorses feeling sleepy. ABG obtained showing ph of 7.302 with CO2 of 68 - started on bipap. Lab work reviewed: Hypocalcemic at 5.5 - hepatic function panel added to check albumin, mag added on as well. New AKI with creatinine of 1.54. Hospitalist consulted who will admit for further work up and management.   Patient seen by and discussed with Dr. Ellender Hose who agrees with treatment plan.   Final Clinical Impressions(s) / ED Diagnoses   Final diagnoses:  Shortness of breath  Hypocalcemia  AKI (acute kidney injury) [facility])  Hypercapnemia    ED Discharge Orders    None       Ward, Ozella Almond, PA-C 07/10/17 2023    Duffy Bruce, MD 07/11/17 1032

## 2017-07-10 NOTE — H&P (Signed)
History and Physical    BOBBYE PETTI WJX:[id number] DOB: 1950/03/16 DOA: 07/10/2017  PCP: Thornton Dales I, MD   Patient coming from: home  Chief Complaint: SOB, fatigue, swelling    HPI: Jillian Eaton is a 67 y.o. female with medical history significant for BMI 54, asthma, chronic hypoxic and hypercarbic respiratory failure, chronic diastolic CHF, pulmonary hypertension, fibromyalgia, atrial fibrillation on Eliquis, and chronic anemia and thrombocytopenia, now presenting to the emergency department for evaluation of increased dyspnea and swelling.  Patient reports that the symptoms have been progressing over months, but worsened acutely over the past several days.  She has been in communication with her cardiologist regarding this and her Lasix was increased.  She reports some improvement with increased Lasix, but continues to have increased bilateral leg swelling and severe dyspnea with any exertion.  She denies fevers, chills, chest pain, or cough.  She denies wheezing.  She has not used her Symbicort or neb treatments at home in several months.  She follows with pulmonology at [facility] and is prescribed 3 L/min of supplemental oxygen around-the-clock.  ED Course: Upon arrival to the ED, patient is found to be afebrile, saturating 83% on room air, and with vitals otherwise stable.  EKG features atrial fibrillation with PVC and chest x-ray is notable for pulmonary vascular congestion without edema or consolidation.  Chemistry panel is notable for BUN of 32, creatinine 1.54, from 0.89 last month, and serum calcium of 5.5 with albumin level not available.  CBC is notable for stable normocytic anemia with hemoglobin of 9.7 and a stable thrombocytopenia with platelets 137,000.  Troponin is undetectable and BNP is elevated to 246.  ABG reveals pH 7.30, pCO2 69, and pO2 74.  Patient was given 1 g of IV calcium in the ED and started on BiPAP.  She remains hemodynamically stable and will be admitted to  the stepdown unit for ongoing evaluation and management of acute on chronic hypoxic and hypercarbic respiratory failure.  Review of Systems:  All other systems reviewed and apart from HPI, are negative.  Past Medical History:  Diagnosis Date  . Anemia   . Asthma   . Chronic headache   . DOE (dyspnea on exertion)    2D ECHO, 02/12/2012 - EF 60-65%, moderate concentric hypertrophy  . Fibromyalgia    nerve pain"left side at waist level" "can't lay on that side without pain" , "HOB elevation helps"  . Heart murmur   . Hematuria - cause not known   . History of kidney stones    x 2 '13, '14 surgery to remove  . Hypertension   . SBO (small bowel obstruction) ([place]) 06/07/2013  . SOB (shortness of breath)    NUCLEAR STRESS TEST, 03/02/2009 - no ischemic ST changes or symptoms  . Thyroid disease    "goiter"  . Transfusion history    10 yrs+  . Urinary incontinence, functional     Past Surgical History:  Procedure Laterality Date  . CARDIAC CATHETERIZATION  04/04/2010   No significant obstructive coronary artery disease  . CHOLECYSTECTOMY  1990  . COLONOSCOPY W/ POLYPECTOMY    . COLONOSCOPY WITH PROPOFOL N/A 04/10/2014   Procedure: COLONOSCOPY WITH PROPOFOL;  Surgeon: Beryle Beams, MD;  Location: [facility] ENDOSCOPY;  Service: Endoscopy;  Laterality: N/A;  . DIAGNOSTIC LAPAROSCOPY     x2 bowel obstructions(adhesions)  . 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 THYROIDECTOMY    . TUBAL  LIGATION  1986     reports that she has never smoked. She has never used smokeless tobacco. She reports that she does not drink alcohol or use drugs.  Allergies  Allergen Reactions  . Norco [Hydrocodone-Acetaminophen] Itching    Family History  Problem Relation Age of Onset  . Diabetes Mother   . Epilepsy Mother   . Cancer Mother        Breast  . Hypertension Mother   . Breast cancer Mother   . Kidney disease Father   . Diabetes Father   . Hypertension  Father   . Asthma Father   . Heart disease Father   . Epilepsy Sister   . Cancer Maternal Grandmother   . Breast cancer Maternal Grandmother   . Cancer Paternal Grandmother   . Breast cancer Paternal Grandmother      Prior to Admission medications   Medication Sig Start Date End Date Taking? Authorizing Provider  acetaminophen (TYLENOL) 650 MG CR tablet Take 650 mg by mouth 2 (two) times daily.    Yes [provider]  apixaban (ELIQUIS) 5 MG TABS tablet Take 1 tablet (5 mg total) by mouth 2 (two) times daily. 06/21/17  Yes Hilty, Nadean Corwin, MD  budesonide-formoterol (SYMBICORT) 160-4.5 MCG/ACT inhaler Inhale 2 puffs into the lungs 2 (two) times daily as needed (shortness of breath.).    Yes Tanda Rockers, MD  aspirin EC 81 MG EC tablet Take 1 tablet (81 mg total) by mouth daily. Patient not taking: Reported on 07/10/2017 05/03/16   Rama, Venetia Maxon, MD  carvedilol (COREG) 12.5 MG tablet Take 1 tablet (12.5 mg total) by mouth 2 (two) times daily. 06/19/17   Hilty, Nadean Corwin, MD  cyclobenzaprine (FLEXERIL) 10 MG tablet Take 1 tablet by mouth 3 (three) times daily. 04/20/16   [provider]  DULoxetine (CYMBALTA) 60 MG capsule TAKE 1 CAPSULE BY MOUTH ONCE DAILY 08/21/16   [provider]  Eszopiclone 3 MG TABS TAKE 1 TABLET BY MOUTH NIGHTLY IMMEDIATELY BEFORE BEDTIME 08/17/16   [provider]  furosemide (LASIX) 20 MG tablet Take 2 tablets (40 mg total) by mouth 2 (two) times daily. 06/21/17   Hilty, Nadean Corwin, MD  levalbuterol (XOPENEX) 0.63 MG/3ML nebulizer solution Take 3 mLs (0.63 mg total) by nebulization every 4 (four) hours as needed for wheezing or shortness of breath. 05/02/16   Rama, Venetia Maxon, MD  levothyroxine (SYNTHROID, LEVOTHROID) 200 MCG tablet Take 1 tablet (200 mcg total) by mouth daily before breakfast. 06/21/17   Hilty, Nadean Corwin, MD  LYRICA 200 MG capsule Take 200 mg by mouth 3 (three) times daily. 08/09/16   [provider]  mirabegron  ER (MYRBETRIQ) 50 MG TB24 tablet Take 50 mg by mouth daily.    [provider]  OXYGEN Inhale 2 L/min into the lungs continuous.    [provider]  Vitamin D, Ergocalciferol, (DRISDOL) 50000 units CAPS capsule Take 50,000 Units by mouth every 7 (seven) days.    [provider]    Physical Exam: Vitals:   07/10/17 1827 07/10/17 1830 07/10/17 1845 07/10/17 1855  BP: 96/73 98/76 104/68 104/68  Pulse: 92 90 93 83  Resp: 19 17 15 15   Temp:      TempSrc:      SpO2: 92% 96% 95% 100%  Height:          Constitutional: Not in acute distress, calm, obese Eyes: PERTLA, lids and conjunctivae normal ENMT: Mucous membranes are moist. Posterior  pharynx clear of any exudate or lesions.   Neck: normal, supple, no masses, no thyromegaly Respiratory: no wheezing, no rhonchi. Dyspneic with speech. No accessory muscle use.  Cardiovascular: S1 & S2 heard, regular rate and rhythm. Pretibial pitting edema bilaterally. Abdomen: No distension, no tenderness, no masses palpated. Bowel sounds normal.  Musculoskeletal: no clubbing / cyanosis. No joint deformity upper and lower extremities.    Skin: no significant rashes, lesions, ulcers. Warm, dry, well-perfused. Neurologic: no facial asymmetry. Sensation intact. Moving all extremities.  Psychiatric: Alert and oriented to person, place, and situation. Calm, cooperative.     Labs on Admission: I have personally reviewed following labs and imaging studies  CBC: Recent Labs  Lab 07/10/17 1700  WBC 5.2  NEUTROABS 3.6  HGB 9.7*  HCT 32.8*  MCV 85.2  PLT 782*   Basic Metabolic Panel: Recent Labs  Lab 07/10/17 1700  NA 144  K 4.6  CL 100*  CO2 31  GLUCOSE 93  BUN 32*  CREATININE 1.54*  CALCIUM 5.5*   GFR: CrCl cannot be calculated (Unknown ideal weight.). Liver Function Tests: No results for input(s): AST, ALT, ALKPHOS, BILITOT, PROT, ALBUMIN in the last 168 hours. No results for input(s): LIPASE, AMYLASE in the  last 168 hours. No results for input(s): AMMONIA in the last 168 hours. Coagulation Profile: No results for input(s): INR, PROTIME in the last 168 hours. Cardiac Enzymes: No results for input(s): CKTOTAL, CKMB, CKMBINDEX, TROPONINI in the last 168 hours. BNP (last 3 results) Recent Labs    06/19/17 1222  PROBNP 2,308*   HbA1C: No results for input(s): HGBA1C in the last 72 hours. CBG: No results for input(s): GLUCAP in the last 168 hours. Lipid Profile: No results for input(s): CHOL, HDL, LDLCALC, TRIG, CHOLHDL, LDLDIRECT in the last 72 hours. Thyroid Function Tests: No results for input(s): TSH, T4TOTAL, FREET4, T3FREE, THYROIDAB in the last 72 hours. Anemia Panel: No results for input(s): VITAMINB12, FOLATE, FERRITIN, TIBC, IRON, RETICCTPCT in the last 72 hours. Urine analysis:    Component Value Date/Time   COLORURINE AMBER (A) 04/23/2016 2359   APPEARANCEUR CLOUDY (A) 04/23/2016 2359   LABSPEC 1.030 04/23/2016 2359   PHURINE 5.0 04/23/2016 2359   GLUCOSEU 50 (A) 04/23/2016 2359   HGBUR MODERATE (A) 04/23/2016 2359   BILIRUBINUR SMALL (A) 04/23/2016 2359   KETONESUR 5 (A) 04/23/2016 2359   PROTEINUR >=300 (A) 04/23/2016 2359   UROBILINOGEN 1.0 06/06/2013 2154   NITRITE NEGATIVE 04/23/2016 2359   LEUKOCYTESUR NEGATIVE 04/23/2016 2359   Sepsis Labs: @LABRCNTIP (procalcitonin:4,lacticidven:4) )No results found for this or any previous visit (from the past 240 hour(s)).   Radiological Exams on Admission: Dg Chest 2 View  Result Date: 07/10/2017 CLINICAL DATA:  Shortness of breath EXAM: CHEST - 2 VIEW COMPARISON:  April 19, 2017 FINDINGS: There is cardiomegaly with pulmonary venous hypertension. There is bibasilar atelectasis. There is no frank edema or consolidation. No adenopathy. There is degenerative change in the thoracic spine. IMPRESSION: Pulmonary vascular congestion without edema or consolidation. Electronically Signed   By: Lowella Grip III M.D.   On:  07/10/2017 17:44    EKG: Independently reviewed. Atrial fibrillation, PVC.    Assessment/Plan   1. Acute on chronic hypoxic and hypercarbic respiratory failure  - Presents with SOB and swelling, saturating 83% on EMS arrival  - Follows with pulm at [facility] for chronic resp failure and is prescribed 3 Lpm supplemental O2 atc - Per pulm and cardiology clinic notes, she has baseline O2 sat of  85% on room air and much of this is suspected secondary to body habitus   - CXR with vascular congestion, no frank edema or consolidation  - ABG with pH 7.30, pCO2 69, pO2 74  - Started on BiPAP in ED  - Likely multifactorial with contributions from OHS, CHF, pulmonary hypertension, and possibly asthma  - Repeat ABG in a couple hours and transition off BiPAP as able  - SLIV, fluid-restrict diet, continue ICS/LABA and prn nebs  2. Acute on chronic diastolic CHF  - Presents with SOB and swelling  - CXR with vascular congestion, no frank edema on imaging  - Echo 06/22/17 with preserved EF, pulmonary HTN  - Reports some improvement in swelling with recent increase in Lasix but still appears hypervolemic on admission  - SLIV, fluid-restrict diet, hold Lasix initially given critical electrolyte derangements and AKI   3. Asthma  - Follows with pulmonology at [facility]  - Has not been using her Symbicort  - Requiring BiPAP on admission as above  - No wheezing appreciated on admission   - Continue ICS/LABA and prn albuterol nebs   4. Acute kidney injury  - SCr is 1.54 on admission  - Likely related to recent increase in Lasix - Appears hypervolemic - Check urine chemistries, renally-dose medications, repeat chem panel in am    5. Hypocalcemia  - Serum calcium is 5.5 on admission  - Treated in ED with 1 g IV calcium gluconate  - Check LFT's, PTH, phosphorus   6. Anemia; thrombocytopenia  - Hgb is stable at 9.7 and platelets stable at 137k  - No bleeding, monitor    7. Hypothyroidism  - Hyperthyroid on  labs from 06/19/17 and her Synthroid dose was decreased  - Continue Synthroid   8. Fibromyalgia  - Continue Lyrica and Cymbalta  9. Atrial fibrillation  - In rate-controlled a fib on admission  - CHADS-VASc is at least 3 (gender, age, CHF)  - Continue Eliquis and beta-blocker    DVT prophylaxis: Eliquis  Code Status: Full  Family Communication: Discussed with patient Consults called: none Admission status: inpatient     Vianne Bulls, MD [facility] Pager [phone]  If 7PM-7AM, please contact night-coverage [email or web address] Password [facility]  07/10/2017, 7:40 PM

## 2017-07-10 NOTE — ED Notes (Signed)
Respiratory called to bring BiPap

## 2017-07-10 NOTE — ED Notes (Addendum)
Pt found to have taken off bipap mask, pt re-educated on importance of keeping bipap mask on. Pt given mouth swab for dry mouth. Respiratory called to re-adjust bipap mask. Will continue to monitor. Pt family member being verbally aggressive with this RN and stating that this RN was "man-handling patient." This RN attempted to re-adjust mask without being forceful. This RN attempted to de-escalate situation. Will continue to monitor.

## 2017-07-10 NOTE — Progress Notes (Signed)
Patient placed on bipap 10/5 30%. Patient tolerating well.

## 2017-07-10 NOTE — ED Notes (Signed)
Respiratory at bedside.

## 2017-07-10 NOTE — ED Notes (Signed)
Patient transported to X-ray 

## 2017-07-10 NOTE — ED Triage Notes (Signed)
Pt brought in by EMS for C/o  Increased SOB and increased extremity swelling ; pt recently diagnosed with afib and CHF x2weeks ago ; was put on lasix and Eliquis; pt 83% RA upon ems arrival

## 2017-07-10 NOTE — ED Notes (Signed)
Opyd, MD paged about critical CO2 result.

## 2017-07-11 ENCOUNTER — Encounter (HOSPITAL_COMMUNITY): Payer: Self-pay

## 2017-07-11 ENCOUNTER — Other Ambulatory Visit: Payer: Self-pay

## 2017-07-11 DIAGNOSIS — I5033 Acute on chronic diastolic (congestive) heart failure: Secondary | ICD-10-CM

## 2017-07-11 LAB — CBC WITH DIFFERENTIAL/PLATELET
ABS IMMATURE GRANULOCYTES: 0 10*3/uL (ref 0.0–0.1)
BASOS ABS: 0 10*3/uL (ref 0.0–0.1)
Basophils Relative: 1 %
Eosinophils Absolute: 0.3 10*3/uL (ref 0.0–0.7)
Eosinophils Relative: 5 %
HCT: 33.3 % — ABNORMAL LOW (ref 36.0–46.0)
HEMOGLOBIN: 9.8 g/dL — AB (ref 12.0–15.0)
Immature Granulocytes: 0 %
LYMPHS PCT: 23 %
Lymphs Abs: 1.2 10*3/uL (ref 0.7–4.0)
MCH: 25.1 pg — AB (ref 26.0–34.0)
MCHC: 29.4 g/dL — ABNORMAL LOW (ref 30.0–36.0)
MCV: 85.2 fL (ref 78.0–100.0)
MONO ABS: 0.7 10*3/uL (ref 0.1–1.0)
MONOS PCT: 13 %
NEUTROS ABS: 3.1 10*3/uL (ref 1.7–7.7)
Neutrophils Relative %: 58 %
Platelets: 131 10*3/uL — ABNORMAL LOW (ref 150–400)
RBC: 3.91 MIL/uL (ref 3.87–5.11)
RDW: 16.9 % — ABNORMAL HIGH (ref 11.5–15.5)
WBC: 5.3 10*3/uL (ref 4.0–10.5)

## 2017-07-11 LAB — COMPREHENSIVE METABOLIC PANEL
ALBUMIN: 3.1 g/dL — AB (ref 3.5–5.0)
ALT: 9 U/L — ABNORMAL LOW (ref 14–54)
ANION GAP: 12 (ref 5–15)
AST: 16 U/L (ref 15–41)
Alkaline Phosphatase: 93 U/L (ref 38–126)
BUN: 29 mg/dL — ABNORMAL HIGH (ref 6–20)
CHLORIDE: 101 mmol/L (ref 101–111)
CO2: 31 mmol/L (ref 22–32)
Calcium: 5.6 mg/dL — CL (ref 8.9–10.3)
Creatinine, Ser: 1.31 mg/dL — ABNORMAL HIGH (ref 0.44–1.00)
GFR calc Af Amer: 48 mL/min — ABNORMAL LOW (ref >60)
GFR calc non Af Amer: 42 mL/min — ABNORMAL LOW (ref >60)
GLUCOSE: 78 mg/dL (ref 65–99)
POTASSIUM: 4.2 mmol/L (ref 3.5–5.1)
SODIUM: 144 mmol/L (ref 135–145)
Total Bilirubin: 0.7 mg/dL (ref 0.3–1.2)
Total Protein: 6.6 g/dL (ref 6.5–8.1)

## 2017-07-11 LAB — I-STAT ARTERIAL BLOOD GAS, ED
ACID-BASE EXCESS: 7 mmol/L — AB (ref 0.0–2.0)
Acid-Base Excess: 5 mmol/L — ABNORMAL HIGH (ref 0.0–2.0)
BICARBONATE: 34.4 mmol/L — AB (ref 20.0–28.0)
Bicarbonate: 35.8 mmol/L — ABNORMAL HIGH (ref 20.0–28.0)
O2 Saturation: 95 %
O2 Saturation: 95 %
PCO2 ART: 84.4 mmHg — AB (ref 32.0–48.0)
PCO2 ART: 81.9 mmHg — AB (ref 32.0–48.0)
PO2 ART: 96 mmHg (ref 83.0–108.0)
PO2 ART: 90 mmHg (ref 83.0–108.0)
Patient temperature: 98.6
Patient temperature: 98.6
TCO2: 37 mmol/L — AB (ref 22–32)
TCO2: 38 mmol/L — ABNORMAL HIGH (ref 22–32)
pH, Arterial: 7.219 — ABNORMAL LOW (ref 7.350–7.450)
pH, Arterial: 7.248 — ABNORMAL LOW (ref 7.350–7.450)

## 2017-07-11 LAB — BASIC METABOLIC PANEL
ANION GAP: 11 (ref 5–15)
ANION GAP: 12 (ref 5–15)
BUN: 26 mg/dL — ABNORMAL HIGH (ref 6–20)
BUN: 23 mg/dL — ABNORMAL HIGH (ref 6–20)
CHLORIDE: 100 mmol/L — AB (ref 101–111)
CO2: 34 mmol/L — AB (ref 22–32)
CO2: 35 mmol/L — ABNORMAL HIGH (ref 22–32)
Calcium: 5.8 mg/dL — CL (ref 8.9–10.3)
Calcium: 6.1 mg/dL — CL (ref 8.9–10.3)
Chloride: 100 mmol/L — ABNORMAL LOW (ref 101–111)
Creatinine, Ser: 1.21 mg/dL — ABNORMAL HIGH (ref 0.44–1.00)
Creatinine, Ser: 1.08 mg/dL — ABNORMAL HIGH (ref 0.44–1.00)
GFR calc non Af Amer: 53 mL/min — ABNORMAL LOW (ref >60)
GFR, EST AFRICAN AMERICAN: 53 mL/min — AB (ref >60)
GFR, EST NON AFRICAN AMERICAN: 46 mL/min — AB (ref >60)
GLUCOSE: 71 mg/dL (ref 65–99)
Glucose, Bld: 73 mg/dL (ref 65–99)
POTASSIUM: 4.4 mmol/L (ref 3.5–5.1)
Potassium: 4.1 mmol/L (ref 3.5–5.1)
Sodium: 145 mmol/L (ref 135–145)
Sodium: 147 mmol/L — ABNORMAL HIGH (ref 135–145)

## 2017-07-11 LAB — SODIUM, URINE, RANDOM: Sodium, Ur: 84 mmol/L

## 2017-07-11 LAB — LACTIC ACID, PLASMA
LACTIC ACID, VENOUS: 0.6 mmol/L (ref 0.5–1.9)
Lactic Acid, Venous: 0.6 mmol/L (ref 0.5–1.9)

## 2017-07-11 LAB — HIV ANTIBODY (ROUTINE TESTING W REFLEX): HIV Screen 4th Generation wRfx: NONREACTIVE

## 2017-07-11 LAB — CREATININE, URINE, RANDOM: CREATININE, URINE: 78.15 mg/dL

## 2017-07-11 LAB — MRSA PCR SCREENING: MRSA BY PCR: NEGATIVE

## 2017-07-11 MED ORDER — HYDROCODONE-ACETAMINOPHEN 5-325 MG PO TABS: 1.0000 | ORAL_TABLET | ORAL | Status: DC | PRN

## 2017-07-11 MED ORDER — SODIUM CHLORIDE 0.9 % IV SOLN
2.0000 g | Freq: Once | INTRAVENOUS | Status: AC
  Administered 2017-07-12: 2 g via INTRAVENOUS
  Filled 2017-07-11: qty 20

## 2017-07-11 MED ORDER — SODIUM CHLORIDE 0.9 % IV SOLN
1.0000 g | Freq: Once | INTRAVENOUS | Status: AC
  Administered 2017-07-11: 1 g via INTRAVENOUS
  Filled 2017-07-11: qty 10

## 2017-07-11 MED ORDER — ALBUTEROL SULFATE (2.5 MG/3ML) 0.083% IN NEBU
2.5000 mg | INHALATION_SOLUTION | Freq: Four times a day (QID) | RESPIRATORY_TRACT | Status: DC
  Administered 2017-07-11 (×2): 2.5 mg via RESPIRATORY_TRACT
  Filled 2017-07-11 (×3): qty 3

## 2017-07-11 MED ORDER — MAGNESIUM SULFATE 2 GM/50ML IV SOLN
2.0000 g | Freq: Once | INTRAVENOUS | Status: AC
  Administered 2017-07-11: 2 g via INTRAVENOUS
  Filled 2017-07-11: qty 50

## 2017-07-11 MED ORDER — METHYLPREDNISOLONE SODIUM SUCC 40 MG IJ SOLR
40.0000 mg | Freq: Two times a day (BID) | INTRAMUSCULAR | Status: DC
  Filled 2017-07-11: qty 1

## 2017-07-11 MED ORDER — FUROSEMIDE 10 MG/ML IJ SOLN
60.0000 mg | Freq: Once | INTRAMUSCULAR | Status: AC
  Administered 2017-07-11: 60 mg via INTRAVENOUS
  Filled 2017-07-11: qty 6

## 2017-07-11 MED ORDER — CALCIUM CARBONATE 1250 (500 CA) MG PO TABS
1.0000 | ORAL_TABLET | Freq: Three times a day (TID) | ORAL | Status: DC
  Administered 2017-07-11 – 2017-07-12 (×3): 500 mg via ORAL
  Filled 2017-07-11 (×5): qty 1

## 2017-07-11 NOTE — ED Notes (Signed)
Heart healthy lunch tray ordered 

## 2017-07-11 NOTE — ED Notes (Signed)
Opyd, MD messaged about critical calcium level of 5.6

## 2017-07-11 NOTE — Progress Notes (Signed)
PROGRESS NOTE    Jillian Eaton  XLK:[id number] DOB: May 31, 1950 DOA: 07/10/2017 PCP: Thornton Dales I, MD    Brief Narrative:  Jillian Eaton is a 67 y.o. female with medical history significant for BMI 54, asthma, chronic hypoxic and hypercarbic respiratory failure, chronic diastolic CHF, pulmonary hypertension, fibromyalgia, atrial fibrillation on Eliquis, and chronic anemia and thrombocytopenia, now presenting to the emergency department for evaluation of increased dyspnea and swelling.  Patient reports that the symptoms have been progressing over months, but worsened acutely over the past several days.  She has been in communication with her cardiologist regarding this and her Lasix was increased.  She reports some improvement with increased Lasix, but continues to have increased bilateral leg swelling and severe dyspnea with any exertion.  She denies fevers, chills, chest pain, or cough.  She denies wheezing.  She has not used her Symbicort or neb treatments at home in several months.  She follows with pulmonology at [facility] and is prescribed 3 L/min of supplemental oxygen around-the-clock.  ED Course: Upon arrival to the ED, patient is found to be afebrile, saturating 83% on room air, and with vitals otherwise stable.  EKG features atrial fibrillation with PVC and chest x-ray is notable for pulmonary vascular congestion without edema or consolidation.  Chemistry panel is notable for BUN of 32, creatinine 1.54, from 0.89 last month, and serum calcium of 5.5 with albumin level not available.  CBC is notable for stable normocytic anemia with hemoglobin of 9.7 and a stable thrombocytopenia with platelets 137,000.  Troponin is undetectable and BNP is elevated to 246.  ABG reveals pH 7.30, pCO2 69, and pO2 74.  Patient was given 1 g of IV calcium in the ED and started on BiPAP.  She remains hemodynamically stable and will be admitted to the stepdown unit for ongoing evaluation and management of acute  on chronic hypoxic and hypercarbic respiratory failure.     Assessment & Plan:   Principal Problem:   Acute on chronic respiratory failure with hypoxia and hypercapnia (HCC) Active Problems:   Anemia of chronic disease   Pulmonary hypertension (HCC)   Atrial fibrillation (HCC)   Thrombocytopenia (HCC)   Asthma   Acute on chronic diastolic CHF (congestive heart failure) (HCC)   Hypothyroidism   Hypocalcemia   AKI (acute kidney injury) ([place])   Acute on chronic hypoxic and hypercarbic respiratory failure  On chronic 3 L oxygen.  Unclear etiology, might be multifactorial; OHS, asthma, vs heart failure.  Will schedule nebulizer, IV solumedrol.  SBP soft in the 100, will ask CCM to evaluate patient. Will defer to CCM IV lasix.   Acute on chronic diastolic CHF  Chest x ray with pulmonary vascular congestion, no frank pulmonary edema.  Hold lasix due to soft BP, will ask CCM evaluation.    Acute kidney injury  Improved, cr decreased to 1.3 this am.    Hypocalcemia  received IV calcium at 3 am. Will order oral. Repeat B-met this afternoon.   Hypothyroidism On synthroid.   Fibromyalgia  - Continue Lyrica and Cymbalta  Atrial fibrillation  On xarelto, carvedilol.     DVT prophylaxis: Xarelto  Code Status: full code.  Family Communication; no family at bedside.  Disposition Plan: step down unit. CCM evaluation    Consultants:   CCM   Procedures: none   Antimicrobials:   none   Subjective: She is using BIPAP, wake up, answer questions. Report she presents with SOB. Denies chest pain, or worsening  Objective: Vitals:   07/11/17 0601 07/11/17 0603 07/11/17 0615 07/11/17 0640  BP: (!) 90/58 94/65 (!) 88/59 92/71  Pulse: 89 88 93 82  Resp: 11 13 11 12   Temp:      TempSrc:      SpO2: 97% 98% 99% 98%  Height:        Intake/Output Summary (Last 24 hours) at 07/11/2017 0706 Last data filed at 07/11/2017 0700 Gross per 24 hour  Intake 250 ml  Output  400 ml  Net -150 ml   There were no vitals filed for this visit.  Examination:  General exam: on BIPAP, follows command.  Respiratory system: on BIPAP, bilateral air movement, no ronchus, no wheezing.  Cardiovascular system: S1 & S2 heard, RRR. No JVD, murmurs, rubs, gallops or clicks. Gastrointestinal system: Abdomen is nondistended, soft and nontender. No organomegaly or masses felt. Normal bowel sounds heard. Central nervous system: Alert and oriented. Follows command.  Extremities: Symmetric 5 x 5 power. Skin: No rashes, lesions or ulcers    Data Reviewed: I have personally reviewed following labs and imaging studies  CBC: Recent Labs  Lab 07/10/17 1700 07/11/17 0219  WBC 5.2 5.3  NEUTROABS 3.6 3.1  HGB 9.7* 9.8*  HCT 32.8* 33.3*  MCV 85.2 85.2  PLT 137* 366*   Basic Metabolic Panel: Recent Labs  Lab 07/10/17 1700 07/10/17 2104 07/11/17 0219  NA 144  --  144  K 4.6  --  4.2  CL 100*  --  101  CO2 31  --  31  GLUCOSE 93  --  78  BUN 32*  --  29*  CREATININE 1.54*  --  1.31*  CALCIUM 5.5*  --  5.6*  MG  --  1.2*  --   PHOS  --  7.4*  --    GFR: CrCl cannot be calculated (Unknown ideal weight.). Liver Function Tests: Recent Labs  Lab 07/10/17 2104 07/11/17 0219  AST 15 16  ALT 10* 9*  ALKPHOS 92 93  BILITOT 0.8 0.7  PROT 6.4* 6.6  ALBUMIN 3.0* 3.1*   No results for input(s): LIPASE, AMYLASE in the last 168 hours. No results for input(s): AMMONIA in the last 168 hours. Coagulation Profile: No results for input(s): INR, PROTIME in the last 168 hours. Cardiac Enzymes: No results for input(s): CKTOTAL, CKMB, CKMBINDEX, TROPONINI in the last 168 hours. BNP (last 3 results) Recent Labs    06/19/17 1222  PROBNP 2,308*   HbA1C: No results for input(s): HGBA1C in the last 72 hours. CBG: No results for input(s): GLUCAP in the last 168 hours. Lipid Profile: No results for input(s): CHOL, HDL, LDLCALC, TRIG, CHOLHDL, LDLDIRECT in the last 72  hours. Thyroid Function Tests: No results for input(s): TSH, T4TOTAL, FREET4, T3FREE, THYROIDAB in the last 72 hours. Anemia Panel: No results for input(s): VITAMINB12, FOLATE, FERRITIN, TIBC, IRON, RETICCTPCT in the last 72 hours. Sepsis Labs: No results for input(s): PROCALCITON, LATICACIDVEN in the last 168 hours.  No results found for this or any previous visit (from the past 240 hour(s)).       Radiology Studies: Dg Chest 2 View  Result Date: 07/10/2017 CLINICAL DATA:  Shortness of breath EXAM: CHEST - 2 VIEW COMPARISON:  April 19, 2017 FINDINGS: There is cardiomegaly with pulmonary venous hypertension. There is bibasilar atelectasis. There is no frank edema or consolidation. No adenopathy. There is degenerative change in the thoracic spine. IMPRESSION: Pulmonary vascular congestion without edema or consolidation. Electronically Signed   By: Gwyndolyn Saxon  Jasmine December III M.D.   On: 07/10/2017 17:44        Scheduled Meds: . apixaban  5 mg Oral BID  . calcium carbonate  1 tablet Oral TID WC  . carvedilol  12.5 mg Oral BID WC  . cyclobenzaprine  10 mg Oral TID  . DULoxetine  60 mg Oral Daily  . levothyroxine  200 mcg Oral QAC breakfast  . mirabegron ER  50 mg Oral Daily  . mometasone-formoterol  2 puff Inhalation BID  . pregabalin  200 mg Oral TID  . sodium chloride flush  3 mL Intravenous Q12H  . sodium chloride flush  3 mL Intravenous Q12H   Continuous Infusions: . sodium chloride       LOS: 1 day    Time spent: 35 minutes.     Elmarie Shiley, MD [facility] Pager [phone]  If 7PM-7AM, please contact night-coverage [email or web address] Password [facility] 07/11/2017, 7:06 AM

## 2017-07-11 NOTE — ED Notes (Signed)
This tech attempted to draw blood twice from Pt's RAC; was unsuccessful both times. RN is attempting to draw lab work off Pt's IV.

## 2017-07-11 NOTE — ED Notes (Signed)
Breakfast tray ordered 

## 2017-07-11 NOTE — ED Notes (Signed)
Spoke with pt's daughter, Servando Salina, in reference to present plan of care; daughter verbalized understanding, all questions answered; Keishonna's contact # [phone]

## 2017-07-11 NOTE — Telephone Encounter (Signed)
Patient current admission

## 2017-07-11 NOTE — Progress Notes (Signed)
Critical ABG results reported to Noe Gens, NP. RT increased backup RR on BIPAP to 12 per NP.

## 2017-07-11 NOTE — ED Notes (Signed)
Care assumed at this time - resting quietly on stretcher with eyes closed - BiPap in place - pt seemingly tolerating well; will call RT to trial off BiPap

## 2017-07-11 NOTE — ED Notes (Signed)
Respiratory called to help with transport to IP unit; ED respiratory not available at this time, but will send someone to help as soon as someone is available so pt may be transported

## 2017-07-11 NOTE — ED Notes (Signed)
This NT unsuccessful at blood draw.

## 2017-07-11 NOTE — Consult Note (Signed)
PULMONARY / CRITICAL CARE MEDICINE   Name: Jillian Eaton MRN: [id number] DOB: 04-15-1950    ADMISSION DATE:  07/10/2017 CONSULTATION DATE:  07/10/2017  REFERRING MD:  Dr. Tyrell Antonio  CHIEF COMPLAINT:  SOB & LE edema  HISTORY OF PRESENT ILLNESS:   Jillian Eaton is a 67 y.o. Female who presents to [facility] ED on 07/09/17 with c/o of SOB and LE edema.  She reports that her symptoms have been progressing over the past few weeks, but that her SOB and LE edema had worsened over the weekend, prompting her to contact her Cardiologist, who increased her Lasix to 40 mg BID.  She had no real improvement in her symptoms, and sought evaluation in the ED pn 5/21.  She denies any fever, chills, cough, chest pain, wheezing, or any sick contacts.  She also denies having to use any rescue inhalers, but was using 2L supplemental O2 which had been prescribed for her.  She reports a decline in being able tolerate baseline activities (walking from her living room to her car) due to SOB with just getting off the couch. She reports about a 15 lb weight gain over the past week.  In the ED, initial workup reveals O2 sats 83% on room air, A-fib on EKG, and CXR concerning for pulmonary vascular congestion without edema or consolidation.  Troponin negative, BMP elevated at 246, and ABG 7.3/ 69/ 74/ 34.  WBC is normal at 5.3, Lactic acid 0.6.  Chemistry panel reveals BUN 32 and Creatinine 1.54.  Pt was placed on Bipap.  Follow up ABG in AM on 5/22 shows 7.219/ 84.4/ 96/ 34.4, of which Bipap settings increased to 16/5.  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  PAST MEDICAL HISTORY :  She  has a past medical history of Anemia, Asthma, Chronic headache, DOE (dyspnea on exertion), Fibromyalgia, Heart murmur, Hematuria - cause not known, History of kidney stones, Hypertension, SBO (small  bowel obstruction) ([place]) (06/07/2013), SOB (shortness of breath), Thyroid disease, Transfusion history, and Urinary incontinence, functional.  PAST SURGICAL HISTORY: She  has a past surgical history that includes Cholecystectomy (1990); Gastroplasty (1985); Tubal ligation (1986); Cardiac catheterization (04/04/2010); Thyroidectomy; Diagnostic laparoscopy; Colonoscopy w/ polypectomy; and Colonoscopy with propofol (N/A, 04/10/2014).  Allergies  Allergen Reactions  . Norco [Hydrocodone-Acetaminophen] Itching    No current facility-administered medications on file prior to encounter.    Current Outpatient Medications on File Prior to Encounter  Medication Sig  . acetaminophen (TYLENOL) 650 MG CR tablet Take 650 mg by mouth every 8 (eight) hours as needed for pain.   Marland Kitchen apixaban (ELIQUIS) 5 MG TABS tablet Take 1 tablet (5 mg total) by mouth 2 (two) times daily.  . budesonide-formoterol (SYMBICORT) 160-4.5 MCG/ACT inhaler Inhale 2 puffs into the lungs 2 (two) times daily as needed (shortness of breath.).   Marland Kitchen carvedilol (COREG) 12.5 MG tablet Take 1 tablet (12.5 mg total) by mouth 2 (two) times daily. (Patient taking differently: Take 12.5 mg by mouth daily. )  . cyclobenzaprine (FLEXERIL) 10 MG tablet Take 1 tablet by mouth 2 (two) times daily.   . eszopiclone (LUNESTA) 2 MG TABS tablet Take 2 mg by mouth at bedtime.  . furosemide (LASIX) 20 MG tablet Take 2 tablets (40 mg total) by mouth 2 (two) times daily.  Marland Kitchen levothyroxine (SYNTHROID, LEVOTHROID) 200 MCG tablet Take 1 tablet (200 mcg total) by  mouth daily before breakfast.  . LYRICA 200 MG capsule Take 200 mg by mouth 2 (two) times daily.   . meloxicam (MOBIC) 15 MG tablet Take 15 mg by mouth daily.  . mirabegron ER (MYRBETRIQ) 50 MG TB24 tablet Take 50 mg by mouth daily.  . OXYGEN Inhale 2 L/min into the lungs continuous.  . traZODone (DESYREL) 50 MG tablet Take 100 mg by mouth at bedtime.  . Vitamin D, Ergocalciferol, (DRISDOL) 50000 units CAPS  capsule Take 50,000 Units by mouth every 7 (seven) days.    FAMILY HISTORY:  Her indicated that her mother is deceased. She indicated that her father is deceased. She indicated that three of her four sisters are alive. She indicated that her maternal grandmother is deceased. She indicated that her maternal grandfather is deceased. She indicated that her paternal grandmother is deceased. She indicated that her paternal grandfather is deceased. She indicated that all of her three children are alive.   SOCIAL HISTORY: She  reports that she has never smoked. She has never used smokeless tobacco. She reports that she does not drink alcohol or use drugs.  REVIEW OF SYSTEMS:   Positives in BOLD: Gen: Denies fever, chills, weight change, fatigue, night sweats HEENT: Denies blurred vision, double vision, hearing loss, tinnitus, sinus congestion, rhinorrhea, sore throat, neck stiffness, dysphagia PULM: Denies +shortness of breath, cough, sputum production, hemoptysis, wheezing CV: Denies chest pain, +edema, orthopnea, paroxysmal nocturnal dyspnea, palpitations GI: Denies abdominal pain, nausea, vomiting, diarrhea, hematochezia, melena, constipation, change in bowel habits GU: Denies dysuria, hematuria, polyuria, oliguria, urethral discharge Endocrine: Denies hot or cold intolerance, polyuria, polyphagia or appetite change Derm: Denies rash, dry skin, scaling or peeling skin change Heme: Denies easy bruising, bleeding, bleeding gums Neuro: Denies headache, numbness, weakness, slurred speech, loss of memory or consciousness   SUBJECTIVE:  Pt on Bipap 16/5, 30% FiO2 Reports that her SOB & LE edema are about the same  VITAL SIGNS: BP 94/68   Pulse 92   Temp 98.4 F (36.9 C) (Oral)   Resp 12   Ht 5\' 7"  (1.702 m)   SpO2 99%   BMI 54.19 kg/m   HEMODYNAMICS:    VENTILATOR SETTINGS: FiO2 (%):  [30 %-40 %] 40 %  INTAKE / OUTPUT: I/O last 3 completed shifts: In: 250 [IV Piggyback:250] Out:  400 [Urine:400]  PHYSICAL EXAMINATION: General:  Obese female, laying in bed, on Bipap, in no acute distress Neuro:  Awake, A&Ox4, follows commands HEENT:  Atraumatic, normocephalic, neck supple Cardiovascular:  Irregularly irregular rhythm, no M/R/G Lungs:  Clear throughout, diminished in bases bilaterally, on Bipap, symmetrical expansion, no assessory muscle use Abdomen:  Obese, soft, non-tender, BS+ x4 Musculoskeletal:  No deformities, active ROM all extremities Skin:  Warm, dry.  No obvious rashes, lesions, or ulcerations  LABS:  BMET Recent Labs  Lab 07/10/17 1700 07/11/17 0219  NA 144 144  K 4.6 4.2  CL 100* 101  CO2 31 31  BUN 32* 29*  CREATININE 1.54* 1.31*  GLUCOSE 93 78    Electrolytes Recent Labs  Lab 07/10/17 1700 07/10/17 2104 07/11/17 0219  CALCIUM 5.5*  --  5.6*  MG  --  1.2*  --   PHOS  --  7.4*  --     CBC Recent Labs  Lab 07/10/17 1700 07/11/17 0219  WBC 5.2 5.3  HGB 9.7* 9.8*  HCT 32.8* 33.3*  PLT 137* 131*    Coag's No results for input(s): APTT, INR in the last 168 hours.  Sepsis Markers No results for input(s): LATICACIDVEN, PROCALCITON, O2SATVEN in the last 168 hours.  ABG Recent Labs  Lab 07/10/17 1834 07/10/17 2114 07/11/17 0750  PHART 7.302* 7.318* 7.219*  PCO2ART 68.8* 66.9* 84.4*  PO2ART 74.0* 80.0* 96.0    Liver Enzymes Recent Labs  Lab 07/10/17 2104 07/11/17 0219  AST 15 16  ALT 10* 9*  ALKPHOS 92 93  BILITOT 0.8 0.7  ALBUMIN 3.0* 3.1*    Cardiac Enzymes No results for input(s): TROPONINI, PROBNP in the last 168 hours.  Glucose No results for input(s): GLUCAP in the last 168 hours.  Imaging Dg Chest 2 View  Result Date: 07/10/2017 CLINICAL DATA:  Shortness of breath EXAM: CHEST - 2 VIEW COMPARISON:  April 19, 2017 FINDINGS: There is cardiomegaly with pulmonary venous hypertension. There is bibasilar atelectasis. There is no frank edema or consolidation. No adenopathy. There is degenerative change  in the thoracic spine. IMPRESSION: Pulmonary vascular congestion without edema or consolidation. Electronically Signed   By: Lowella Grip III M.D.   On: 07/10/2017 17:44     STUDIES:  5/21 CXR>> There is cardiomegaly with pulmonary venous hypertension. There is bibasilar atelectasis. There is no frank edema or consolidation. Noadenopathy. There is degenerative change in the thoracic spine.  Pulmonary vascular congestion without edema or consolidation.  CULTURES: None  ANTIBIOTICS: None  SIGNIFICANT EVENTS: 5/22>>Admission to [facility], PCCM consulted  LINES/TUBES: None  BRIEF SUMMARY: 67 y.o. Female presenting with progressive SOB and bilateral LE edema.  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  PCCM consulted for further management.  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 secondary to CHF Exacerbation & Obesity Hypoventilation syndrome DD:[place], Pulmonary HTN P:   Supplemental O2 to maintain O2 sats >92% Bipap F/u ABG at 1400 on 5/22 F/u CXR in am 5/23 Will give 60 mg Lasix x1 dose Continue scheduled Albuterol neb, Dulera Discontinue IV steroids Avoid sedating medications   CARDIOVASCULAR A:  Acute CHF exacerbation A-fib (rate controlled) Hx: Diastolic CHF, A-fib on Eliquis , HTN P: Give 60 mg IV Lasix once Consider holding Coreg for now Continue Eliquis for anticoagulation (A-fib)  RENAL A:   AKI  Hx: Kidney stones, urinary incontinence P:   Monitor I&O's Follow BMP Avoid Nephrotoxic agents Will give 1 time dose Lasix, follow-up BMP at 1700 on 5/22    FAMILY  - Updates: Pt updated at bedside  - Inter-disciplinary family meet or Palliative Care meeting due by:  07/18/17   Darel Hong, AGACNP-BC [facility] Pager: [phone]  07/11/2017, 9:13 AM

## 2017-07-11 NOTE — ED Notes (Signed)
Report given to Ailene Ravel, RN in SDU - ready to accept pt

## 2017-07-11 NOTE — ED Notes (Signed)
Unable to transport pt at this time as she is currently on Bi-Pap and RT unavailable as she is assisting in a cardiac arrest

## 2017-07-11 NOTE — Telephone Encounter (Signed)
Agree with extra lasix for edema - but if she is significantly short of breath, may need ED evaluation for IV diuretics.  Dr. Lemmie Evens

## 2017-07-11 NOTE — ED Notes (Signed)
Critical care MDs in to assess

## 2017-07-11 NOTE — Progress Notes (Signed)
Critical ABG results reported to Dr. Tyrell Antonio and Noe Gens, NP.

## 2017-07-12 ENCOUNTER — Other Ambulatory Visit: Payer: Self-pay

## 2017-07-12 ENCOUNTER — Encounter (HOSPITAL_COMMUNITY): Payer: Self-pay | Admitting: Cardiology

## 2017-07-12 ENCOUNTER — Inpatient Hospital Stay (HOSPITAL_COMMUNITY): Payer: Medicare Other

## 2017-07-12 DIAGNOSIS — J9621 Acute and chronic respiratory failure with hypoxia: Secondary | ICD-10-CM

## 2017-07-12 DIAGNOSIS — J9622 Acute and chronic respiratory failure with hypercapnia: Secondary | ICD-10-CM

## 2017-07-12 LAB — CBC
HCT: 31 % — ABNORMAL LOW (ref 36.0–46.0)
Hemoglobin: 8.9 g/dL — ABNORMAL LOW (ref 12.0–15.0)
MCH: 24.8 pg — ABNORMAL LOW (ref 26.0–34.0)
MCHC: 28.7 g/dL — ABNORMAL LOW (ref 30.0–36.0)
MCV: 86.4 fL (ref 78.0–100.0)
Platelets: 156 10*3/uL (ref 150–400)
RBC: 3.59 MIL/uL — AB (ref 3.87–5.11)
RDW: 16.6 % — ABNORMAL HIGH (ref 11.5–15.5)
WBC: 4.3 10*3/uL (ref 4.0–10.5)

## 2017-07-12 LAB — BASIC METABOLIC PANEL
ANION GAP: 9 (ref 5–15)
BUN: 23 mg/dL — ABNORMAL HIGH (ref 6–20)
CALCIUM: 5.9 mg/dL — AB (ref 8.9–10.3)
CHLORIDE: 100 mmol/L — AB (ref 101–111)
CO2: 36 mmol/L — AB (ref 22–32)
Creatinine, Ser: 1.06 mg/dL — ABNORMAL HIGH (ref 0.44–1.00)
GFR calc non Af Amer: 54 mL/min — ABNORMAL LOW (ref >60)
Glucose, Bld: 83 mg/dL (ref 65–99)
POTASSIUM: 4.3 mmol/L (ref 3.5–5.1)
Sodium: 145 mmol/L (ref 135–145)

## 2017-07-12 LAB — PTH, INTACT AND CALCIUM
Calcium, Total (PTH): 5.5 mg/dL — CL (ref 8.7–10.3)
PTH: 65 pg/mL (ref 15–65)

## 2017-07-12 LAB — UREA NITROGEN, URINE: UREA NITROGEN UR: 411 mg/dL

## 2017-07-12 MED ORDER — CALCIUM CARBONATE 1250 (500 CA) MG PO TABS
1250.0000 mg | ORAL_TABLET | Freq: Three times a day (TID) | ORAL | Status: DC
  Administered 2017-07-12 – 2017-07-17 (×16): 1250 mg via ORAL
  Filled 2017-07-12 (×16): qty 1

## 2017-07-12 MED ORDER — FUROSEMIDE 10 MG/ML IJ SOLN
40.0000 mg | Freq: Once | INTRAMUSCULAR | Status: AC
  Administered 2017-07-12: 40 mg via INTRAVENOUS
  Filled 2017-07-12: qty 4

## 2017-07-12 MED ORDER — BLISTEX MEDICATED EX OINT
1.0000 "application " | TOPICAL_OINTMENT | CUTANEOUS | Status: DC | PRN
  Administered 2017-07-13: 1 via TOPICAL
  Filled 2017-07-12 (×2): qty 6.3

## 2017-07-12 MED ORDER — ACETAMINOPHEN 650 MG RE SUPP: 650.0000 mg | Freq: Four times a day (QID) | RECTAL | Status: DC | PRN

## 2017-07-12 MED ORDER — ACETAMINOPHEN 500 MG PO TABS
1000.0000 mg | ORAL_TABLET | Freq: Four times a day (QID) | ORAL | Status: DC | PRN
  Administered 2017-07-12 – 2017-07-16 (×7): 1000 mg via ORAL
  Filled 2017-07-12 (×8): qty 2

## 2017-07-12 MED ORDER — ALBUTEROL SULFATE (2.5 MG/3ML) 0.083% IN NEBU
2.5000 mg | INHALATION_SOLUTION | Freq: Three times a day (TID) | RESPIRATORY_TRACT | Status: DC
  Administered 2017-07-12 – 2017-07-13 (×4): 2.5 mg via RESPIRATORY_TRACT
  Filled 2017-07-12 (×7): qty 3

## 2017-07-12 MED ORDER — SODIUM CHLORIDE 0.9 % IV SOLN
1.0000 g | Freq: Once | INTRAVENOUS | Status: AC
  Administered 2017-07-12: 1 g via INTRAVENOUS
  Filled 2017-07-12: qty 10

## 2017-07-12 NOTE — Plan of Care (Signed)
  Problem: Education: Goal: Knowledge of General Education information will improve Outcome: Progressing   Problem: Health Behavior/Discharge Planning: Goal: Ability to manage health-related needs will improve Outcome: Progressing   Problem: Clinical Measurements: Goal: Ability to maintain clinical measurements within normal limits will improve Outcome: Progressing Goal: Will remain free from infection Outcome: Progressing Goal: Diagnostic test results will improve Outcome: Progressing Goal: Respiratory complications will improve Outcome: Progressing Goal: Cardiovascular complication will be avoided Outcome: Progressing   Problem: Activity: Goal: Risk for activity intolerance will decrease Outcome: Progressing   Problem: Nutrition: Goal: Adequate nutrition will be maintained Outcome: Progressing   Problem: Coping: Goal: Level of anxiety will decrease Outcome: Progressing   Problem: Elimination: Goal: Will not experience complications related to bowel motility Outcome: Progressing Goal: Will not experience complications related to urinary retention Outcome: Progressing   Problem: Pain Managment: Goal: General experience of comfort will improve Outcome: Progressing   Problem: Safety: Goal: Ability to remain free from injury will improve Outcome: Progressing   Problem: Skin Integrity: Goal: Risk for impaired skin integrity will decrease Outcome: Progressing   Problem: Spiritual Needs Goal: Ability to function at adequate level Outcome: Progressing   

## 2017-07-12 NOTE — Progress Notes (Signed)
Spoke with patient regarding BiPAP. Patient states she is breathing fine and does not need the BiPAP at this current time. Patient is resting comfortably, in no distress at this time. RT will continue to monitor.

## 2017-07-12 NOTE — Progress Notes (Signed)
Responded to [facility] to assist patient with .AD.  Patient wanted time to discuss with family.  Will have Nurse to page Chaplain when ready.  Jaclynn Major, [place], [facility], Pager [phone]

## 2017-07-12 NOTE — Progress Notes (Signed)
PROGRESS NOTE    Jillian Eaton  VOJ:[id number] DOB: [birth year] DOA: 07/10/2017 PCP: Thornton Dales I, MD    Brief Narrative:  Jillian Eaton is a 67 y.o. female with medical history significant for BMI 54, asthma, chronic hypoxic and hypercarbic respiratory failure, chronic diastolic CHF, pulmonary hypertension, fibromyalgia, atrial fibrillation on Eliquis, and chronic anemia and thrombocytopenia, now presenting to the emergency department for evaluation of increased dyspnea and swelling.  Patient reports that the symptoms have been progressing over months, but worsened acutely over the past several days.  She has been in communication with her cardiologist regarding this and her Lasix was increased.  She reports some improvement with increased Lasix, but continues to have increased bilateral leg swelling and severe dyspnea with any exertion.  She denies fevers, chills, chest pain, or cough.  She denies wheezing.  She has not used her Symbicort or neb treatments at home in several months.  She follows with pulmonology at [facility] and is prescribed 3 L/min of supplemental oxygen around-the-clock.  ED Course: Upon arrival to the ED, patient is found to be afebrile, saturating 83% on room air, and with vitals otherwise stable.  EKG features atrial fibrillation with PVC and chest x-ray is notable for pulmonary vascular congestion without edema or consolidation.  Chemistry panel is notable for BUN of 32, creatinine 1.54, from 0.89 last month, and serum calcium of 5.5 with albumin level not available.  CBC is notable for stable normocytic anemia with hemoglobin of 9.7 and a stable thrombocytopenia with platelets 137,000.  Troponin is undetectable and BNP is elevated to 246.  ABG reveals pH 7.30, pCO2 69, and pO2 74.  Patient was given 1 g of IV calcium in the ED and started on BiPAP.  She remains hemodynamically stable and will be admitted to the stepdown unit for ongoing evaluation and management of acute  on chronic hypoxic and hypercarbic respiratory failure.     Assessment & Plan:   Principal Problem:   Acute on chronic respiratory failure with hypoxia and hypercapnia (HCC) Active Problems:   Anemia of chronic disease   Pulmonary hypertension (HCC)   Atrial fibrillation (HCC)   Thrombocytopenia (HCC)   Asthma   Acute on chronic diastolic CHF (congestive heart failure) (HCC)   Hypothyroidism   Hypocalcemia   AKI (acute kidney injury) ([place])   Acute on chronic hypoxic and hypercarbic respiratory failure;  On chronic 3 L oxygen.  Unclear etiology, might be multifactorial; OHS, asthma, vs heart failure.  Continue with schedule nebulizer, IV solumedrol.  CCM consulted due to worsening hypercapnia and soft SBP and need for lasix.  Patient off BIPAP since last night. She is alert and denies dyspnea this am. Advised to use BIPAP if she is taking nap.  Follow pulmonary recommendations.  Needs BIPAP at HS.   Acute on chronic diastolic CHF  Chest x ray with pulmonary vascular congestion, no frank pulmonary edema.  Cardiology consulted. Patient primary cardiologist has been adjusting lasix outpatient.  Cardiology to help with diuretics. Will ordered one time dose of 40 mg IV lasix.   Acute kidney injury  Improved, cr decreased to 1.3 this am.    Hypocalcemia  IV calcium ordered. Continue with oral calcium supplementation.   Hypothyroidism On synthroid.   Fibromyalgia  - Continue Lyrica and Cymbalta  Atrial fibrillation  On xarelto, carvedilol.     DVT prophylaxis: Xarelto  Code Status: full code.  Family Communication; no family at bedside.  Disposition Plan: step down unit. CCM  evaluation    Consultants:   CCM   Procedures: none   Antimicrobials:   none   Subjective: She didn't use BIPAP last night. Explain to her that she needs to use BIPAP when sleeping.  She is breathing better.  Denies chest pain.   Objective: Vitals:   07/11/17 2258 07/12/17  0306 07/12/17 0500 07/12/17 0801  BP: 94/62 105/66  90/62  Pulse: (!) 118 96  99  Resp: 17 15  14   Temp: 98.2 F (36.8 C) (!) 97.4 F (36.3 C)    TempSrc: Oral     SpO2: 99% 95%  96%  Weight:   (!) 156.7 kg (345 lb 7.4 oz)   Height:        Intake/Output Summary (Last 24 hours) at 07/12/2017 0830 Last data filed at 07/12/2017 0500 Gross per 24 hour  Intake 600 ml  Output 800 ml  Net -200 ml   Filed Weights   07/12/17 0500  Weight: (!) 156.7 kg (345 lb 7.4 oz)    Examination:  General exam: alert, follows command.  Respiratory system: normal respiratory effort, distant breath sound.  Cardiovascular system:  S 1, S 2 RRR.  Gastrointestinal system: BS present, soft, nt Central nervous system: alert , follows command.  Extremities: symmetric power. . Skin: No rashes, lesions or ulcers    Data Reviewed: I have personally reviewed following labs and imaging studies  CBC: Recent Labs  Lab 07/10/17 1700 07/11/17 0219 07/12/17 0737  WBC 5.2 5.3 4.3  NEUTROABS 3.6 3.1  --   HGB 9.7* 9.8* 8.9*  HCT 32.8* 33.3* 31.0*  MCV 85.2 85.2 86.4  PLT 137* 131* 250   Basic Metabolic Panel: Recent Labs  Lab 07/10/17 1700 07/10/17 2104 07/11/17 0219 07/11/17 1235 07/11/17 1902  NA 144  --  144 145 147*  K 4.6  --  4.2 4.1 4.4  CL 100*  --  101 100* 100*  CO2 31  --  31 34* 35*  GLUCOSE 93  --  78 73 71  BUN 32*  --  29* 26* 23*  CREATININE 1.54*  --  1.31* 1.21* 1.08*  CALCIUM 5.5*  --  5.6* 5.8* 6.1*  MG  --  1.2*  --   --   --   PHOS  --  7.4*  --   --   --    GFR: Estimated Creatinine Clearance: 81.7 mL/min (A) (by C-G formula based on SCr of 1.08 mg/dL (H)). Liver Function Tests: Recent Labs  Lab 07/10/17 2104 07/11/17 0219  AST 15 16  ALT 10* 9*  ALKPHOS 92 93  BILITOT 0.8 0.7  PROT 6.4* 6.6  ALBUMIN 3.0* 3.1*   No results for input(s): LIPASE, AMYLASE in the last 168 hours. No results for input(s): AMMONIA in the last 168 hours. Coagulation  Profile: No results for input(s): INR, PROTIME in the last 168 hours. Cardiac Enzymes: No results for input(s): CKTOTAL, CKMB, CKMBINDEX, TROPONINI in the last 168 hours. BNP (last 3 results) Recent Labs    06/19/17 1222  PROBNP 2,308*   HbA1C: No results for input(s): HGBA1C in the last 72 hours. CBG: No results for input(s): GLUCAP in the last 168 hours. Lipid Profile: No results for input(s): CHOL, HDL, LDLCALC, TRIG, CHOLHDL, LDLDIRECT in the last 72 hours. Thyroid Function Tests: No results for input(s): TSH, T4TOTAL, FREET4, T3FREE, THYROIDAB in the last 72 hours. Anemia Panel: No results for input(s): VITAMINB12, FOLATE, FERRITIN, TIBC, IRON, RETICCTPCT in the last 72  hours. Sepsis Labs: Recent Labs  Lab 07/11/17 7741 07/11/17 1256  LATICACIDVEN 0.6 0.6    Recent Results (from the past 240 hour(s))  MRSA PCR Screening     Status: None   Collection Time: 07/11/17  5:59 PM  Result Value Ref Range Status   MRSA by PCR NEGATIVE NEGATIVE Final    Comment:        The GeneXpert MRSA Assay (FDA approved for NASAL specimens only), is one component of a comprehensive MRSA colonization surveillance program. It is not intended to diagnose MRSA infection nor to guide or monitor treatment for MRSA infections. Performed at [facility], [place] [address], [place], [place] [address]          Radiology Studies: Dg Chest 2 View  Result Date: 07/10/2017 CLINICAL DATA:  Shortness of breath EXAM: CHEST - 2 VIEW COMPARISON:  April 19, 2017 FINDINGS: There is cardiomegaly with pulmonary venous hypertension. There is bibasilar atelectasis. There is no frank edema or consolidation. No adenopathy. There is degenerative change in the thoracic spine. IMPRESSION: Pulmonary vascular congestion without edema or consolidation. Electronically Signed   By: Lowella Grip III M.D.   On: 07/10/2017 17:44        Scheduled Meds: . albuterol  2.5 mg Nebulization TID  .  apixaban  5 mg Oral BID  . calcium carbonate  1 tablet Oral TID WC  . carvedilol  12.5 mg Oral BID WC  . DULoxetine  60 mg Oral Daily  . levothyroxine  200 mcg Oral QAC breakfast  . mirabegron ER  50 mg Oral Daily  . mometasone-formoterol  2 puff Inhalation BID  . pregabalin  200 mg Oral TID  . sodium chloride flush  3 mL Intravenous Q12H  . sodium chloride flush  3 mL Intravenous Q12H   Continuous Infusions: . sodium chloride       LOS: 2 days    Time spent: 35 minutes.     Elmarie Shiley, MD [facility] Pager [phone]  If 7PM-7AM, please contact night-coverage [email or web address] Password [email or web address] 07/12/2017, 8:30 AM

## 2017-07-12 NOTE — Progress Notes (Signed)
PULMONARY / CRITICAL CARE MEDICINE   Name: Jillian Eaton MRN: [id number] DOB: Jan 31, 1951    ADMISSION DATE:  07/10/2017 CONSULTATION DATE: 07/10/2017  REFERRING MD:  Dr. Jerald Kief, Tried  CHIEF COMPLAINT:  Short of breath  HISTORY OF PRESENT ILLNESS:   67 yo female presented with dyspnea and edema over several weeks with 15 lbs wt gain.  Found to have hypoxia, A fib, pulmonary edema, and acute on chronic hypoxia/hypercapnia.  She uses 2 liters oxygen at home.  PMHx of asthma, fibromyalgia, HTN, nephrolithiasis, hypothyroidism.  She is followed by Dr. Camillo Flaming with pulmonary in [facility].  SUBJECTIVE:  She was confused about what got her into trouble with her breathing.  She didn't understand the purpose of Bipap.  She didn't use last night.  Has more headache this morning.  VITAL SIGNS: BP 90/62 (BP Location: Left Wrist)   Pulse 99   Temp (!) 97.4 F (36.3 C)   Resp 14   Ht 5\' 7"  (1.702 m)   Wt (!) 345 lb 7.4 oz (156.7 kg)   SpO2 97%   BMI 54.11 kg/m   INTAKE / OUTPUT: I/O last 3 completed shifts: In: 850 [P.O.:480; IV Piggyback:370] Out: 1200 [Urine:1200]  PHYSICAL EXAMINATION:  General - pleasant Eyes - pupils reactive ENT - no sinus tenderness, no oral exudate, no LAN Cardiac - regular, no murmur Chest - no wheeze, rales Abd - soft, non tender Ext - 1+ edema Skin - no rashes Neuro - normal strength Psych - normal mood  LABS:  BMET Recent Labs  Lab 07/11/17 0219 07/11/17 1235 07/11/17 1902  NA 144 145 147*  K 4.2 4.1 4.4  CL 101 100* 100*  CO2 31 34* 35*  BUN 29* 26* 23*  CREATININE 1.31* 1.21* 1.08*  GLUCOSE 78 73 71    Electrolytes Recent Labs  Lab 07/10/17 2104 07/11/17 0219 07/11/17 1235 07/11/17 1902  CALCIUM 5.5* 5.6* 5.8* 6.1*  MG 1.2*  --   --   --   PHOS 7.4*  --   --   --     CBC Recent Labs  Lab 07/10/17 1700 07/11/17 0219 07/12/17 0737  WBC 5.2 5.3 4.3  HGB 9.7* 9.8* 8.9*  HCT 32.8* 33.3* 31.0*  PLT 137* 131* 156     Coag's No results for input(s): APTT, INR in the last 168 hours.  Sepsis Markers Recent Labs  Lab 07/11/17 0928 07/11/17 1256  LATICACIDVEN 0.6 0.6    ABG Recent Labs  Lab 07/10/17 2114 07/11/17 0750 07/11/17 1455  PHART 7.318* 7.219* 7.248*  PCO2ART 66.9* 84.4* 81.9*  PO2ART 80.0* 96.0 90.0    Liver Enzymes Recent Labs  Lab 07/10/17 2104 07/11/17 0219  AST 15 16  ALT 10* 9*  ALKPHOS 92 93  BILITOT 0.8 0.7  ALBUMIN 3.0* 3.1*    Cardiac Enzymes No results for input(s): TROPONINI, PROBNP in the last 168 hours.  Glucose No results for input(s): GLUCAP in the last 168 hours.  Imaging Dg Chest Port 1 View  Result Date: 07/12/2017 CLINICAL DATA:  Respiratory failure, short of breath EXAM: PORTABLE CHEST 1 VIEW COMPARISON:  Portable chest x-ray 07/10/2017 FINDINGS: Aeration of the lungs may have improved slightly. There is still moderate cardiomegaly present with mild pulmonary vascular congestion. No definite pleural effusion is seen. IMPRESSION: Slightly improved aeration. Stable cardiomegaly with pulmonary vascular congestion Electronically Signed   By: Ivar Drape M.D.   On: 07/12/2017 09:32     STUDIES:  PSG 05/06/12 >> AHI  0.6 PFT 10/19/15 >> FEV1 1.38 (60%), FEV1% 82, TLC 3.34 (60%), DLCO 74% CT angio chest 08/18/16 >> Rt base scarring, enlarged PA Echo 06/22/17 >> EF 65 to 70%, PAS 48 mmHg  DISCUSSION: 67 yo female with acute on chronic hypoxic/hypercapnic respiratory failure with morbid obesity, obesity hypoventilation syndrome, asthma, HFpEF.  Had extensive d/w her on 5/23 about the nature of her respiratory disorders, the relation to her weight, and the rationale for supplemental oxygen and assisted ventilation.  ASSESSMENT / PLAN:  Acute on chronic hypoxic, hypercapnic respiratory failure from OHS. - oxygen to keep SpO2 90 to 95% - Bipap qhs and prn - will need to get set up for outpt NIPPV - follow HCO3 on BMET and limit ABG draws  Asthma. -  continue dulera  Morbid obesity with BMI 54.11. - discussed importance of weight loss, and the impact her weight is having on her breathing  HFpEF. - diuresis per primary team  WHO group 2 and 3 pulmonary hypertension. - optimize secondary causes of this  Chesley Mires, MD [place] 07/12/2017, 10:44 AM

## 2017-07-12 NOTE — Consult Note (Addendum)
Cardiology Consultation:   Patient ID: Jillian Eaton; [id number]; Apr 19, 1950   Admit date: 07/10/2017 Date of Consult: 07/12/2017  Primary Care Provider: Henri Medal, MD Primary Cardiologist: Chrystie Nose, MD  Primary Electrophysiologist:     Patient Profile:   Jillian Eaton is a 67 y.o. female with a hx of persistent atrial fibrillation first diagnosed 2019 on eliquis, fibromyalgia, HTN, and super morbid obesity who is being seen today for the evaluation of shortness of breath at the request of Dr. Sunnie Eaton.  History of Present Illness:   Ms. Jillian Eaton was recently seen in clinic on 06/19/17. She has a history of super morbid obesity, fibromyalgia, and presented with increasing shortness of breath. She has normal coronaries by cath in 2012. Echo 2019 with preserved EF.  She has suspected sleep apnea and recently started wearing oxygen at night. While traveling, she was found to be in Afib RVR. She was started on xarelto and discharged. In Jillian Eaton, she was in persistent Afib and continued on anticoagulation. She has suspected systolic heart failure thought to be related to her Afib RVR. She was started on 40 mg lasix daily. She experienced hematuria on xarelto. Dr. Rennis Golden wanted her cleared by OBGYN prior to DCCV. She was cleared and  transitioned to eliquis. She is having no bleeding problems on eliquis.  She presented to [facility] 07/10/17 with progressively worsening shortness of breath. She also reports a weight gain and lower extremity swelling. Through telephone calls with our office, we increased her lasix in response to SOB and swelling. She reports this helped a litte. In the ER, she was found to be in Afib by EKG. BNP 246 with a normal creatinine. She was in respiratory failure with acidosis and hypercapnia requiring BiPAP. She has since transitioned to [facility] and is much more comfortable.  Cardiology was asked to evaluate for suspected heart failure exacerbation.   On my interview, She  is comfortable eon 2L [place]. She denies chest pain and palpitations. She states her swelling is improved since being in the hospital.    Past Medical History:  Diagnosis Date  . Anemia   . Asthma   . Chronic headache   . DOE (dyspnea on exertion)    2D ECHO, 02/12/2012 - EF 60-65%, moderate concentric hypertrophy  . Fibromyalgia    nerve pain"left side at waist level" "can't lay on that side without pain" , "HOB elevation helps"  . Heart murmur   . Hematuria - cause not known   . History of kidney stones    x 2 '13, '14 surgery to remove  . Hypertension   . SBO (small bowel obstruction) (HCC) 06/07/2013  . SOB (shortness of breath)    NUCLEAR STRESS TEST, 03/02/2009 - no ischemic ST changes or symptoms  . Thyroid disease    "goiter"  . Transfusion history    10 yrs+  . Urinary incontinence, functional     Past Surgical History:  Procedure Laterality Date  . CARDIAC CATHETERIZATION  04/04/2010   No significant obstructive coronary artery disease  . CHOLECYSTECTOMY  1990  . COLONOSCOPY W/ POLYPECTOMY    . COLONOSCOPY WITH PROPOFOL N/A 04/10/2014   Procedure: COLONOSCOPY WITH PROPOFOL;  Surgeon: Theda Belfast, MD;  Location: [facility];  Service: Endoscopy;  Laterality: N/A;  . DIAGNOSTIC LAPAROSCOPY     x2 bowel obstructions(adhesions)  . 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 THYROIDECTOMY    . TUBAL  LIGATION  1986     Home Medications:  Prior to Admission medications   Medication Sig Start Date End Date Taking? Authorizing Provider  acetaminophen (TYLENOL) 650 MG CR tablet Take 650 mg by mouth every 8 (eight) hours as needed for pain.    Yes [provider]  apixaban (ELIQUIS) 5 MG TABS tablet Take 1 tablet (5 mg total) by mouth 2 (two) times daily. 06/21/17  Yes Hilty, Lisette Abu, MD  budesonide-formoterol (SYMBICORT) 160-4.5 MCG/ACT inhaler Inhale 2 puffs into the lungs 2 (two) times daily as needed (shortness of breath.).     Yes Nyoka Cowden, MD  calcium carbonate (TUMS - DOSED IN MG ELEMENTAL CALCIUM) 500 MG chewable tablet Chew 500 mg by mouth 2 (two) times daily.   Yes [provider]  carvedilol (COREG) 12.5 MG tablet Take 1 tablet (12.5 mg total) by mouth 2 (two) times daily. Patient taking differently: Take 12.5 mg by mouth daily.  06/19/17  Yes Hilty, Lisette Abu, MD  cyclobenzaprine (FLEXERIL) 10 MG tablet Take 1 tablet by mouth 2 (two) times daily.  04/20/16  Yes [provider]  eszopiclone (LUNESTA) 2 MG TABS tablet Take 2 mg by mouth at bedtime. 07/03/17  Yes [provider]  furosemide (LASIX) 20 MG tablet Take 2 tablets (40 mg total) by mouth 2 (two) times daily. 06/21/17  Yes Hilty, Lisette Abu, MD  levothyroxine (SYNTHROID, LEVOTHROID) 200 MCG tablet Take 1 tablet (200 mcg total) by mouth daily before breakfast. 06/21/17  Yes Hilty, Lisette Abu, MD  LYRICA 200 MG capsule Take 200 mg by mouth 2 (two) times daily.  08/09/16  Yes [provider]  magnesium oxide (MAG-OX) 400 MG tablet Take 400 mg by mouth 2 (two) times daily.   Yes [provider]  meloxicam (MOBIC) 15 MG tablet Take 15 mg by mouth daily. 06/22/17  Yes [provider]  mirabegron ER (MYRBETRIQ) 50 MG TB24 tablet Take 50 mg by mouth daily.   Yes [provider]  OXYGEN Inhale 2 L/min into the lungs continuous.   Yes [provider]  traZODone (DESYREL) 50 MG tablet Take 100 mg by mouth at bedtime. 06/16/17  Yes [provider]  Vitamin D, Ergocalciferol, (DRISDOL) 50000 units CAPS capsule Take 50,000 Units by mouth every 7 (seven) days.   Yes [provider]    Inpatient Medications: Scheduled Meds: . albuterol  2.5 mg Nebulization TID  . apixaban  5 mg Oral BID  . calcium carbonate  1,250 mg Oral TID WC  . carvedilol  12.5 mg Oral BID WC  . DULoxetine  60 mg Oral Daily  . levothyroxine  200 mcg Oral QAC breakfast  . mirabegron ER  50 mg Oral Daily  .  mometasone-formoterol  2 puff Inhalation BID  . pregabalin  200 mg Oral TID  . sodium chloride flush  3 mL Intravenous Q12H  . sodium chloride flush  3 mL Intravenous Q12H   Continuous Infusions: . sodium chloride     PRN Meds: sodium chloride, acetaminophen **OR** acetaminophen, bisacodyl, HYDROcodone-acetaminophen, ondansetron **OR** ondansetron (ZOFRAN) IV, senna-docusate, sodium chloride flush, zolpidem  Allergies:    Allergies  Allergen Reactions  . Norco [Hydrocodone-Acetaminophen] Itching    Social History:   Social History   Socioeconomic History  . Marital status: Married    Spouse name: Not on file  . Number of children: 3  . Years of education: Not on file  . Highest education level: Not on file  Occupational History  . Occupation: Self employed Psychologist, sport and exercise  Social Needs  . Financial resource strain: Not on file  . Food insecurity:    Worry: Not on file    Inability: Not on file  . Transportation needs:    Medical: Not on file    Non-medical: Not on file  Tobacco Use  . Smoking status: Never Smoker  . Smokeless tobacco: Never Used  Substance and Sexual Activity  . Alcohol use: No    Comment: wine occ  . Drug use: No    Types: Oxycodone    Comment: perscribed  . Sexual activity: Not Currently    Birth control/protection: None  Lifestyle  . Physical activity:    Days per week: Not on file    Minutes per session: Not on file  . Stress: Not on file  Relationships  . Social connections:    Talks on phone: Not on file    Gets together: Not on file    Attends religious service: Not on file    Active member of club or organization: Not on file    Attends meetings of clubs or organizations: Not on file    Relationship status: Not on file  . Intimate partner violence:    Fear of current or ex partner: Not on file    Emotionally abused: Not on file    Physically abused: Not on file    Forced sexual activity: Not on file  Other Topics Concern  . Not  on file  Social History Narrative   ** Merged History Encounter **        Family History:    Family History  Problem Relation Age of Onset  . Diabetes Mother   . Epilepsy Mother   . Cancer Mother        Breast  . Hypertension Mother   . Breast cancer Mother   . Kidney disease Father   . Diabetes Father   . Hypertension Father   . Asthma Father   . Heart disease Father   . Epilepsy Sister   . Cancer Maternal Grandmother   . Breast cancer Maternal Grandmother   . Cancer Paternal Grandmother   . Breast cancer Paternal Grandmother      ROS:  Please see the history of present illness.   All other ROS reviewed and negative.     Physical Exam/Data:   Vitals:   07/12/17 0929 07/12/17 1000 07/12/17 1241 07/12/17 1343  BP:   90/67   Pulse:  76 88   Resp:  18 15   Temp:   98.3 F (36.8 C)   TempSrc:   Oral   SpO2: 97% 93% 94% 95%  Weight:      Height:        Intake/Output Summary (Last 24 hours) at 07/12/2017 1443 Last data filed at 07/12/2017 1000 Gross per 24 hour  Intake 850 ml  Output 1075 ml  Net -225 ml   Filed Weights   07/12/17 0500  Weight: (!) 345 lb 7.4 oz (156.7 kg)   Body mass index is 54.11 kg/m.  General:  Morbidly obese female, in no acute distress HEENT: normal Neck: no JVD, exam difficult Vascular: No carotid bruits, exam difficult Cardiac:  Irregular rhythm, regular rate, no murmur Lungs:  Coarse sounds throughout, respirations unlabored  Abd: soft, nontender, no hepatomegaly  Ext: no edema Musculoskeletal:  No deformities, BUE and BLE strength normal and equal Skin: warm and dry  Neuro:  CNs 2-12 intact,  no focal abnormalities noted Psych:  Normal affect   EKG:  The EKG was personally reviewed and demonstrates:  Afib Telemetry:  Telemetry was personally reviewed and demonstrates:  Afib, rate controlled  Relevant CV Studies:  Echo 06/22/17: Study Conclusions - Left ventricle: The cavity size was normal. There was mild   concentric  hypertrophy. Systolic function was vigorous. The   estimated ejection fraction was in the range of 65% to 70%. Wall   motion was normal; there were no regional wall motion   abnormalities. The study is not technically sufficient to allow   evaluation of LV diastolic function. - Left atrium: The atrium was moderately to severely dilated. - Pulmonary arteries: PA peak pressure: 48 mm Hg (S).  Impressions: - The right ventricular systolic pressure was increased consistent   with moderate pulmonary hypertension.    Laboratory Data:  Chemistry Recent Labs  Lab 07/11/17 1235 07/11/17 1902 07/12/17 0737  NA 145 147* 145  K 4.1 4.4 4.3  CL 100* 100* 100*  CO2 34* 35* 36*  GLUCOSE 73 71 83  BUN 26* 23* 23*  CREATININE 1.21* 1.08* 1.06*  CALCIUM 5.8* 6.1* 5.9*  GFRNONAA 46* 53* 54*  GFRAA 53* >60 >60  ANIONGAP 11 12 9     Recent Labs  Lab 07/10/17 2104 07/11/17 0219  PROT 6.4* 6.6  ALBUMIN 3.0* 3.1*  AST 15 16  ALT 10* 9*  ALKPHOS 92 93  BILITOT 0.8 0.7   Hematology Recent Labs  Lab 07/10/17 1700 07/11/17 0219 07/12/17 0737  WBC 5.2 5.3 4.3  RBC 3.85* 3.91 3.59*  HGB 9.7* 9.8* 8.9*  HCT 32.8* 33.3* 31.0*  MCV 85.2 85.2 86.4  MCH 25.2* 25.1* 24.8*  MCHC 29.6* 29.4* 28.7*  RDW 16.8* 16.9* 16.6*  PLT 137* 131* 156   Cardiac EnzymesNo results for input(s): TROPONINI in the last 168 hours.  Recent Labs  Lab 07/10/17 1706  TROPIPOC 0.00    BNP Recent Labs  Lab 07/10/17 1700  BNP 246.2*    DDimer No results for input(s): DDIMER in the last 168 hours.  Radiology/Studies:  Dg Chest 2 View  Result Date: 07/10/2017 CLINICAL DATA:  Shortness of breath EXAM: CHEST - 2 VIEW COMPARISON:  April 19, 2017 FINDINGS: There is cardiomegaly with pulmonary venous hypertension. There is bibasilar atelectasis. There is no frank edema or consolidation. No adenopathy. There is degenerative change in the thoracic spine. IMPRESSION: Pulmonary vascular congestion without  edema or consolidation. Electronically Signed   By: Bretta Bang III M.D.   On: 07/10/2017 17:44   Dg Chest Port 1 View  Result Date: 07/12/2017 CLINICAL DATA:  Respiratory failure, short of breath EXAM: PORTABLE CHEST 1 VIEW COMPARISON:  Portable chest x-ray 07/10/2017 FINDINGS: Aeration of the lungs may have improved slightly. There is still moderate cardiomegaly present with mild pulmonary vascular congestion. No definite pleural effusion is seen. IMPRESSION: Slightly improved aeration. Stable cardiomegaly with pulmonary vascular congestion Electronically Signed   By: Dwyane Dee M.D.   On: 07/12/2017 09:32    Assessment and Plan:   1. Acute on chronic hypoxic and hypercarbic respiratory failure requiring BiPAP, asthma  2. Acute on chronic diastolic heart failure, moderate pulmonary hypertension by echo 06/22/17 - CXR with cardiomegaly and pulmonary vascular congestion - BNP 246 with normal creatinine - echo pending - follows with pulmonary at [facility] - required BiPAP on admission - she is diuresing on 40 mg IV lasix daily - she is overall net negative 350 cc with 800  cc urine output yesterday - continue coreg - this respiratory failure seems out of proportion to volume overload - echo results will guide medication decisions - agree with 40 mg IV lasix for now   3. Afib - This patients CHA2DS2-VASc Score and unadjusted Ischemic Stroke Rate (% per year) is equal to 3.2 % stroke rate/year from a score of 3 (HTN, female, age) - continue eliquis - she has not missed a dose - will be eligible for DCCV after 3 weeks of anticoagulation - continue coreg - she is tolerating the rhythm well   4. AKI - resolved   For questions or updates, please contact [facility] Please consult [email or web address] for contact info under Cardiology/STEMI.   Signed, Marcelino Duster, [place]  07/12/2017 2:43 PM  History and all data above reviewed.  Patient examined.  I agree with the findings as above.    The patient has had a long history of respiratory failure. We have tried to review records at [facility].  She has been followed by pulmonary and we see that she had right heart cath.  We cannot find this report.  She has not had LV failure but she does have elevated pulmonary pressures.  She has morbid obesity.  She has had a recent diagnosis of atrial fib.  More recently she was started on diuretic with increased dose over the phone but she did not feel any different with this.  She denies chest pain.  She came to the hospital with progressive dyspnea with a hospital course as above.  He  The patient exam reveals COR:RRR  ,  Lungs: Decreased breath sounds  ,  Abd: Positive bowel sounds, no rebound no guarding, Ext No edema  .  All available labs, radiology testing, previous records reviewed. Agree with documented assessment and plan.   Acute respiratory distress.  I have reviewed all available records.  Her cardiac silhouette has increased in size.  I would like to have a limited echo to make sure that there has not been an interval effusion or change in LV function related to the fib. I agree with Lasix.  She should have DCCV but she missed a dose of Eliquis this admit so this cannot be done.  With her size I would not attempt TEE/DCCV.  I do not think that repeat right heart cath is indicated.  There is no suggestion of ischemia.  Possible hypoventilation obesity syndrome.  We will try to get records of previous right heart cath.   Fayrene Fearing Keelan Pomerleau  4:13 PM  07/12/2017

## 2017-07-12 NOTE — Discharge Instructions (Signed)

## 2017-07-13 ENCOUNTER — Inpatient Hospital Stay (HOSPITAL_COMMUNITY): Payer: Medicare Other

## 2017-07-13 ENCOUNTER — Encounter (HOSPITAL_COMMUNITY): Payer: Self-pay

## 2017-07-13 DIAGNOSIS — I361 Nonrheumatic tricuspid (valve) insufficiency: Secondary | ICD-10-CM

## 2017-07-13 DIAGNOSIS — R0689 Other abnormalities of breathing: Secondary | ICD-10-CM

## 2017-07-13 LAB — BASIC METABOLIC PANEL
Anion gap: 9 (ref 5–15)
BUN: 26 mg/dL — AB (ref 6–20)
CALCIUM: 5.8 mg/dL — AB (ref 8.9–10.3)
CHLORIDE: 98 mmol/L — AB (ref 101–111)
CO2: 36 mmol/L — ABNORMAL HIGH (ref 22–32)
CREATININE: 1.19 mg/dL — AB (ref 0.44–1.00)
GFR calc non Af Amer: 47 mL/min — ABNORMAL LOW (ref >60)
GFR, EST AFRICAN AMERICAN: 54 mL/min — AB (ref >60)
GLUCOSE: 88 mg/dL (ref 65–99)
Potassium: 4.2 mmol/L (ref 3.5–5.1)
Sodium: 143 mmol/L (ref 135–145)

## 2017-07-13 LAB — ECHOCARDIOGRAM COMPLETE
HEIGHTINCHES: 67 in
WEIGHTICAEL: 5463.88 [oz_av]

## 2017-07-13 LAB — MAGNESIUM: Magnesium: 1.3 mg/dL — ABNORMAL LOW (ref 1.7–2.4)

## 2017-07-13 MED ORDER — FERROUS SULFATE 325 (65 FE) MG PO TABS
325.0000 mg | ORAL_TABLET | Freq: Three times a day (TID) | ORAL | Status: DC
  Administered 2017-07-14 – 2017-07-17 (×11): 325 mg via ORAL
  Filled 2017-07-13 (×11): qty 1

## 2017-07-13 MED ORDER — CARVEDILOL 6.25 MG PO TABS
6.2500 mg | ORAL_TABLET | Freq: Two times a day (BID) | ORAL | Status: DC
  Administered 2017-07-13 – 2017-07-14 (×2): 6.25 mg via ORAL
  Filled 2017-07-13 (×3): qty 1

## 2017-07-13 MED ORDER — MAGNESIUM SULFATE 2 GM/50ML IV SOLN
2.0000 g | Freq: Once | INTRAVENOUS | Status: AC
  Administered 2017-07-13: 2 g via INTRAVENOUS
  Filled 2017-07-13: qty 50

## 2017-07-13 MED ORDER — VITAMIN D (ERGOCALCIFEROL) 1.25 MG (50000 UNIT) PO CAPS
50000.0000 [IU] | ORAL_CAPSULE | ORAL | Status: DC
  Administered 2017-07-13: 50000 [IU] via ORAL
  Filled 2017-07-13: qty 1

## 2017-07-13 MED ORDER — SODIUM CHLORIDE 0.9 % IV SOLN
1.0000 g | Freq: Once | INTRAVENOUS | Status: AC
  Administered 2017-07-13: 1 g via INTRAVENOUS
  Filled 2017-07-13: qty 10

## 2017-07-13 NOTE — Progress Notes (Signed)
PULMONARY / CRITICAL CARE MEDICINE   Name: Jillian Eaton MRN: [id number] DOB: 05/14/1950    ADMISSION DATE:  07/10/2017 CONSULTATION DATE: 07/10/2017  REFERRING MD:  Dr. Jerald Kief, Tried  CHIEF COMPLAINT:  Short of breath  HISTORY OF PRESENT ILLNESS:   67 yo female presented with dyspnea and edema over several weeks with 15 lbs wt gain.  Found to have hypoxia, A fib, pulmonary edema, and acute on chronic hypoxia/hypercapnia.  She uses 2 liters oxygen at home.  PMHx of asthma, fibromyalgia, HTN, nephrolithiasis, hypothyroidism.  She is followed by Dr. Camillo Flaming with pulmonary in [facility].  SUBJECTIVE:  Did not use the BiPAP last night for unclear reason She is alert morning with no dyspnea or other respiratory complaints  VITAL SIGNS: BP (!) 85/65   Pulse 86   Temp 97.8 F (36.6 C)   Resp 16   Ht 5\' 7"  (1.702 m)   Wt (!) 341 lb 7.9 oz (154.9 kg)   SpO2 99%   BMI 53.49 kg/m   INTAKE / OUTPUT: I/O last 3 completed shifts: In: 1613 [P.O.:1330; I.V.:3; IV Piggyback:280] Out: 2625 [Urine:2625]  PHYSICAL EXAMINATION: Blood pressure (!) 85/65, pulse 86, temperature 97.8 F (36.6 C), resp. rate 16, height 5\' 7"  (1.702 m), weight (!) 341 lb 7.9 oz (154.9 kg), SpO2 99 %. Gen:      No acute distress HEENT:  EOMI, sclera anicteric Neck:     No masses; no thyromegaly Lungs:    Clear to auscultation bilaterally; normal respiratory effort CV:         Regular rate and rhythm; no murmurs Abd:      + bowel sounds; soft, non-tender; no palpable masses, no distension Ext:    No edema; adequate peripheral perfusion Skin:      Warm and dry; no rash Neuro: alert and oriented x 3 Psych: normal mood and affect  LABS:  BMET Recent Labs  Lab 07/11/17 1902 07/12/17 0737 07/13/17 0314  NA 147* 145 143  K 4.4 4.3 4.2  CL 100* 100* 98*  CO2 35* 36* 36*  BUN 23* 23* 26*  CREATININE 1.08* 1.06* 1.19*  GLUCOSE 71 83 88    Electrolytes Recent Labs  Lab 07/10/17 2104  07/11/17 1902  07/12/17 0737 07/13/17 0314  CALCIUM 5.5*   < > 6.1* 5.9* 5.8*  MG 1.2*  --   --   --  1.3*  PHOS 7.4*  --   --   --   --    < > = values in this interval not displayed.    CBC Recent Labs  Lab 07/10/17 1700 07/11/17 0219 07/12/17 0737  WBC 5.2 5.3 4.3  HGB 9.7* 9.8* 8.9*  HCT 32.8* 33.3* 31.0*  PLT 137* 131* 156    Coag's No results for input(s): APTT, INR in the last 168 hours.  Sepsis Markers Recent Labs  Lab 07/11/17 0928 07/11/17 1256  LATICACIDVEN 0.6 0.6    ABG Recent Labs  Lab 07/10/17 2114 07/11/17 0750 07/11/17 1455  PHART 7.318* 7.219* 7.248*  PCO2ART 66.9* 84.4* 81.9*  PO2ART 80.0* 96.0 90.0    Liver Enzymes Recent Labs  Lab 07/10/17 2104 07/11/17 0219  AST 15 16  ALT 10* 9*  ALKPHOS 92 93  BILITOT 0.8 0.7  ALBUMIN 3.0* 3.1*    Cardiac Enzymes No results for input(s): TROPONINI, PROBNP in the last 168 hours.  Glucose No results for input(s): GLUCAP in the last 168 hours.  Imaging Chest x-ray 07/12/2017-cardiomegaly, pulmonary vascular  congestion I have reviewed the images personally.  STUDIES:  PSG 05/06/12 >> AHI 0.6 PFT 10/19/15 >> FEV1 1.38 (60%), FEV1% 82, TLC 3.34 (60%), DLCO 74% CT angio chest 08/18/16 >> Rt base scarring, enlarged PA Echo 06/22/17 >> EF 65 to 70%, PAS 48 mmHg  DISCUSSION: 67 yo female with acute on chronic hypoxic/hypercapnic respiratory failure with morbid obesity, obesity hypoventilation syndrome, asthma, HFpEF.    ASSESSMENT / PLAN: Acute on chronic hypoxic, hypercarbic respiratory failure Obesity, hypoventilation syndrome Continue supplemental oxygen Continue BiPAP at night and as needed.  Discussed with patient and nursing that she needs to be placed on BiPAP tonight She will need an outpatient sleep study and follow-up with her pulmonologist at [facility] Encouraged weight loss.  Asthma- stable Continue Dulera  HFpEF. Cardiology on board.  WHO group 2 and 3 pulmonary hypertension. Diuresis,  supplemental oxygen, BiPAP as noted above  Marshell Garfinkel MD [facility] 07/13/2017, 9:54 AM

## 2017-07-13 NOTE — Evaluation (Signed)
Physical Therapy Evaluation Patient Details Name: Jillian Eaton MRN: [id number] DOB: August 02, 1950 Today's Date: 07/13/2017   History of Present Illness  67 y.o. female with PMH of persistent atrial fibrillation on eliquis (new diagnosis 05/2017), chronic diastolic CHF, moderate pulmHTN, HTN, fibromyalgia, hypothyroidism, super morbid obesity, obesity hypoventilation syndrome with chronic hypoxic/hypercarbic respiratory failure, who presented with SOB. Cardiology following for acute on chronic diastolic CHF. Pulmonology following for acute on chronic hypoxic, hypercarbic respiratory failure.    Clinical Impression  PTA pt ambulated household distances with RW, and wheelchair for appointments,  required assist from husband  for ascend/descend of stairs and bathing and iADLs. Pt currently limited in safe mobility by decreased strength and endurance. Pt currently mod A for bed mobility, transfers and ambulation of 10 feet with RW. PT recommends SNF level rehab at d/c to work on building strength to be able to get up and down stairs in home to get to upstairs bedroom. PT will continue to follow acutely to work towards her goals.      Follow Up Recommendations SNF    Equipment Recommendations  None recommended by PT    Recommendations for Other Services       Precautions / Restrictions Precautions Precautions: Fall Restrictions Weight Bearing Restrictions: No      Mobility  Bed Mobility Overal bed mobility: Needs Assistance Bed Mobility: Supine to Sit;Sit to Supine     Supine to sit: Min assist Sit to supine: Mod assist   General bed mobility comments: minA for trunk to upright for sit>supine, modA for LE managment into bed  Transfers Overall transfer level: Needs assistance Equipment used: Rolling walker (2 wheeled) Transfers: Sit to/from Stand Sit to Stand: Mod assist         General transfer comment: modA for powerup to RW, requires increased time and effort to come to  fully upright, vc for hand placement for powerup   Ambulation/Gait Ambulation/Gait assistance: Mod assist Ambulation Distance (Feet): 10 Feet Assistive device: Rolling walker (2 wheeled) Gait Pattern/deviations: Step-through pattern;Decreased stride length;Shuffle;Wide base of support;Trunk flexed     General Gait Details: modA for steadying, vc for upright posture,   Stairs            Wheelchair Mobility    Modified Rankin (Stroke Patients Only)       Balance Overall balance assessment: Needs assistance Sitting-balance support: Feet supported;No upper extremity supported Sitting balance-Leahy Scale: Good     Standing balance support: During functional activity;Single extremity supported Standing balance-Leahy Scale: Fair Standing balance comment: requires single UE support while pulling up briefs                             Pertinent Vitals/Pain Pain Assessment: No/denies pain    Home Living Family/patient expects to be discharged to:: Private residence Living Arrangements: Spouse/significant other Available Help at Discharge: Family;Available 24 hours/day Type of Home: House Home Access: Stairs to enter Entrance Stairs-Rails: None Entrance Stairs-Number of Steps: 1 Home Layout: Two level;Bed/bath upstairs Home Equipment: Tub bench;Walker - 2 wheels;Bedside commode;Wheelchair - manual Additional Comments: Difficulty getting upstairs to bedroom.    Prior Function Level of Independence: Needs assistance   Gait / Transfers Assistance Needed: has been staying on second level unles goes to appt.   ADL's / Homemaking Assistance Needed: Able to get bathed with effort, and dressed but occasionally husband assisted with LB dressing        Hand Dominance  Extremity/Trunk Assessment   Upper Extremity Assessment Upper Extremity Assessment: Generalized weakness    Lower Extremity Assessment Lower Extremity Assessment: Generalized weakness        Communication   Communication: No difficulties  Cognition Arousal/Alertness: Awake/alert Behavior During Therapy: WFL for tasks assessed/performed Overall Cognitive Status: No family/caregiver present to determine baseline cognitive functioning                                        General Comments General comments (skin integrity, edema, etc.): at rest on 3L O2 via nasal cannula, SaO2 98%O2, HR 100 bpm, BP 91/63, with ambulation SaO2 dropped to 89%O2, max HR 134 bpm, after ambulation BP 127/83, SaO2 94%O2, HR 102 bpm        Assessment/Plan    PT Assessment Patient needs continued PT services  PT Problem List Decreased strength;Decreased activity tolerance;Decreased balance;Decreased mobility;Cardiopulmonary status limiting activity;Obesity       PT Treatment Interventions DME instruction;Gait training;Functional mobility training;Therapeutic activities;Therapeutic exercise;Balance training;Cognitive remediation;Patient/family education;Stair training    PT Goals (Current goals can be found in the Care Plan section)  Acute Rehab PT Goals Patient Stated Goal: be able to get up stairs in house PT Goal Formulation: With patient Time For Goal Achievement: 07/27/17 Potential to Achieve Goals: Fair    Frequency Min 2X/week    AM-PAC PT "6 Clicks" Daily Activity  Outcome Measure Difficulty turning over in bed (including adjusting bedclothes, sheets and blankets)?: A Little Difficulty moving from lying on back to sitting on the side of the bed? : Unable Difficulty sitting down on and standing up from a chair with arms (e.g., wheelchair, bedside commode, etc,.)?: Unable Help needed moving to and from a bed to chair (including a wheelchair)?: A Lot Help needed walking in hospital room?: A Lot Help needed climbing 3-5 steps with a railing? : Total 6 Click Score: 10    End of Session Equipment Utilized During Treatment: Gait belt;Oxygen Activity Tolerance:  Patient tolerated treatment well Patient left: in chair;with call bell/phone within reach Nurse Communication: Mobility status PT Visit Diagnosis: Unsteadiness on feet (R26.81);Other abnormalities of gait and mobility (R26.89);Muscle weakness (generalized) (M62.81);Difficulty in walking, not elsewhere classified (R26.2)    Time: 2725-3664 PT Time Calculation (min) (ACUTE ONLY): 31 min   Charges:   PT Evaluation $PT Eval Moderate Complexity: 1 Mod PT Treatments $Gait Training: 8-22 mins   PT G Codes:        Florean Hoobler B. Beverely Risen PT, DPT Acute Rehabilitation  [phone] Pager [phone]    Elon Alas Fleet 07/13/2017, 4:18 PM

## 2017-07-13 NOTE — Progress Notes (Addendum)
Progress Note  Patient Name: Jillian Eaton Date of Encounter: 07/13/2017  Primary Cardiologist: Pixie Casino, MD   Subjective   Sleeping without CPAP/BiPAP on entering the room. With word finding difficulty initially; improved throughout conversation. Notes improvement in SOB but not quite at baseline. Denies chest pain or palpitations. Noted mild dizziness this morning but no lightheadedness or syncope.   Inpatient Medications    Scheduled Meds: . albuterol  2.5 mg Nebulization TID  . apixaban  5 mg Oral BID  . calcium carbonate  1,250 mg Oral TID WC  . carvedilol  12.5 mg Oral BID WC  . DULoxetine  60 mg Oral Daily  . levothyroxine  200 mcg Oral QAC breakfast  . mirabegron ER  50 mg Oral Daily  . mometasone-formoterol  2 puff Inhalation BID  . pregabalin  200 mg Oral TID  . sodium chloride flush  3 mL Intravenous Q12H  . sodium chloride flush  3 mL Intravenous Q12H   Continuous Infusions: . sodium chloride     PRN Meds: sodium chloride, acetaminophen **OR** acetaminophen, bisacodyl, HYDROcodone-acetaminophen, lip balm, ondansetron **OR** ondansetron (ZOFRAN) IV, senna-docusate, sodium chloride flush, zolpidem   Vital Signs    Vitals:   07/13/17 0723 07/13/17 0730 07/13/17 0802 07/13/17 0820  BP: (!) 89/60  (!) 85/65   Pulse: 84 81 86   Resp: 16 16    Temp: 97.8 F (36.6 C)     TempSrc:      SpO2: 95% 94%  99%  Weight:      Height:        Intake/Output Summary (Last 24 hours) at 07/13/2017 1033 Last data filed at 07/13/2017 0900 Gross per 24 hour  Intake 1003 ml  Output 1550 ml  Net -547 ml   Filed Weights   07/12/17 0500 07/13/17 0344  Weight: (!) 345 lb 7.4 oz (156.7 kg) (!) 341 lb 7.9 oz (154.9 kg)    Telemetry    Atrial fibrillation with rate generally well controlled with brief episodes of HR >100 - Personally Reviewed  Physical Exam   GEN: Laying in bed in no acute distress.   Neck: difficult to assess JVD given body habitus, no carotid  bruits Cardiac: IRRR, no murmurs, rubs, or gallops.  Respiratory: mild crackles at lung bases  GI: NABS, Soft, obese nontender, non-distended  MS: No pitting edema; No deformity. Neuro:  Nonfocal, moving all extremities spontaneously Psych: Normal affect   Labs    Chemistry Recent Labs  Lab 07/10/17 2104 07/11/17 0219  07/11/17 1902 07/12/17 0737 07/13/17 0314  NA  --  144   < > 147* 145 143  K  --  4.2   < > 4.4 4.3 4.2  CL  --  101   < > 100* 100* 98*  CO2  --  31   < > 35* 36* 36*  GLUCOSE  --  78   < > 71 83 88  BUN  --  29*   < > 23* 23* 26*  CREATININE  --  1.31*   < > 1.08* 1.06* 1.19*  CALCIUM 5.5* 5.6*   < > 6.1* 5.9* 5.8*  PROT 6.4* 6.6  --   --   --   --   ALBUMIN 3.0* 3.1*  --   --   --   --   AST 15 16  --   --   --   --   ALT 10* 9*  --   --   --   --  ALKPHOS 92 93  --   --   --   --   BILITOT 0.8 0.7  --   --   --   --   GFRNONAA  --  42*   < > 53* 54* 47*  GFRAA  --  48*   < > >60 >60 54*  ANIONGAP  --  12   < > 12 9 9    < > = values in this interval not displayed.     Hematology Recent Labs  Lab 07/10/17 1700 07/11/17 0219 07/12/17 0737  WBC 5.2 5.3 4.3  RBC 3.85* 3.91 3.59*  HGB 9.7* 9.8* 8.9*  HCT 32.8* 33.3* 31.0*  MCV 85.2 85.2 86.4  MCH 25.2* 25.1* 24.8*  MCHC 29.6* 29.4* 28.7*  RDW 16.8* 16.9* 16.6*  PLT 137* 131* 156    Cardiac EnzymesNo results for input(s): TROPONINI in the last 168 hours.  Recent Labs  Lab 07/10/17 1706  TROPIPOC 0.00     BNP Recent Labs  Lab 07/10/17 1700  BNP 246.2*     DDimer No results for input(s): DDIMER in the last 168 hours.   Radiology    Dg Chest Port 1 View  Result Date: 07/12/2017 CLINICAL DATA:  Respiratory failure, short of breath EXAM: PORTABLE CHEST 1 VIEW COMPARISON:  Portable chest x-ray 07/10/2017 FINDINGS: Aeration of the lungs may have improved slightly. There is still moderate cardiomegaly present with mild pulmonary vascular congestion. No definite pleural effusion is seen.  IMPRESSION: Slightly improved aeration. Stable cardiomegaly with pulmonary vascular congestion Electronically Signed   By: Ivar Drape M.D.   On: 07/12/2017 09:32    Cardiac Studies   Limited Echo 06/22/17: Study Conclusions - Left ventricle: The cavity size was normal. There was mild concentric hypertrophy. Systolic function was vigorous. The estimated ejection fraction was in the range of 65% to 70%. Wall motion was normal; there were no regional wall motion abnormalities. The study is not technically sufficient to allow evaluation of LV diastolic function. - Left atrium: The atrium was moderately to severely dilated. - Pulmonary arteries: PA peak pressure: 48 mm Hg (S).  Impressions: - The right ventricular systolic pressure was increased consistent with moderate pulmonary hypertension.    Patient Profile     67 y.o. female with PMH of persistent atrial fibrillation on eliquis (new diagnosis 05/2017), chronic diastolic CHF, moderate pulmHTN, HTN, fibromyalgia, hypothyroidism, super morbid obesity, obesity hypoventilation syndrome with chronic hypoxic/hypercarbic respiratory failure, who presented with SOB. Cardiology following for acute on chronic diastolic CHF.   Assessment & Plan    1. Acute on chronic respiratory failure: likely multifactorial due to acute on chronic diastolic CHF, obesity hypoventilation syndrome with acute on chronic hypoxic/hypercarbic respiratory failure, and pulmonary hypertension.  - See below  2. Acute on chronic diastolic CHF: p/w SOB. BNP mildly elevated, 246. Last echo earlier this month with EF 65-70% with moderate pulmonary HTN. CXR with increase cardiac silhouette. No significant volume overload on exam. Patient was started on IV lasix daily with net -832mL UOP in the last 24hrs and -1.1L this admission.  - Echo pending to reassess LV function - Would like to give lasix today but BP quite soft - consider dosing this afternoon if BP  improved  3. Acute on chronic hypoxic and hypercarbic respiratory failure with obesity hypoventilation syndrome: on home O2 at baseline. Pulmonary following this admission recommended BiPAP at night and outpatient sleep study.  - Continue management per primary team and pulmonary team  4. Persistent  atrial fibrillation: rate controlled. Started on eliquis 06/21/17. Unfortunately she missed a dose of eliquis this admission. Would benefit from DCCV in 3-4 weeks, as long as she doesn't miss any doses of eliquis. Not a candidate for TEE/DCCV given body habitus and respiratory issues. Coreg held last night and this morning given hypotension. HR generally 80s-100s. - Continue Eliquis BID for CHADS2VASC score of 4 (HTN, CHF, age 67, and female) - Could consider lower dose of coreg to increase likelihood she receives medication BID  5. HTN: BP soft; patient is asymptomatic - Continue coreg for rate control as BP tolerates   For questions or updates, please contact [place] Please consult [email or web address] for contact info under Cardiology/STEMI.      Signed, Abigail Butts, PA-C  07/13/2017, 10:33 AM   [phone]  History and all data above reviewed.  Patient examined.  I agree with the findings as above.  She thinks that she is breathing better but not quite at baseline.  The patient exam reveals [id number]  ,  Lungs: Decreased on the right base   ,  Abd: Positive bowel sounds, no rebound no guarding, Ext No edema   .  All available labs, radiology testing, previous records reviewed. Agree with documented assessment and plan.  Options are limited for diuresis.  She has very low BP and with elevated pulmonary pressure she is volume dependent.  She is oxygenating OK and net negative.  For now no further diuretic.  Rate relatively controlled.  No significant intervention likely possible for another three weeks after she has been on Eliquis.  We could consider TEE/DCCV if her rate is very  uncontrolled as she mobilizes.  Ordered incentive spirometry.  She is working with PT.    Minus Breeding  1:52 PM  07/13/2017

## 2017-07-13 NOTE — Progress Notes (Signed)
  Echocardiogram 2D Echocardiogram has been performed.  Jillian Eaton 07/13/2017, 3:49 PM

## 2017-07-13 NOTE — Clinical Social Work Note (Signed)
Clinical Social Work Assessment  Patient Details  Name: Jillian Eaton MRN: [id number] Date of Birth: 1950-10-21  Date of referral:  07/13/17               Reason for consult:  Facility Placement                Permission sought to share information with:  Facility Art therapist granted to share information::  Yes, Verbal Permission Granted  Name::        Agency::  SNFs  Relationship::     Contact Information:     Housing/Transportation Living arrangements for the past 2 months:  Single Family Home Source of Information:  Patient, Adult Children Patient Interpreter Needed:  None Criminal Activity/Legal Involvement Pertinent to Current Situation/Hospitalization:  No - Comment as needed Significant Relationships:  Adult Children, Spouse Lives with:  Spouse Do you feel safe going back to the place where you live?  No Need for family participation in patient care:  No (Coment)  Care giving concerns:  CSW received consult for possible SNF placement at time of discharge. CSW spoke with patient regarding PT recommendation of SNF placement at time of discharge. Patient reported that patient's spouse is currently unable to care for patient at their home given patient's current physical needs and fall risk. Patient expressed understanding of PT recommendation and is agreeable to SNF placement at time of discharge. CSW to continue to follow and assist with discharge planning needs.   Social Worker assessment / plan:  CSW spoke with patient concerning possibility of rehab at [facility] before returning home.  Employment status:  Retired Forensic scientist:  Medicare PT Recommendations:  [place] / Referral to community resources:  [place]  Patient/Family's Response to care:  Patient recognizes need for rehab before returning home and is agreeable to a SNF in [place]. Patient does not want to go back to [place]. CSW notes  barriers may include ordering a bariatric bed and a bipap. List provided.   Patient/Family's Understanding of and Emotional Response to Diagnosis, Current Treatment, and Prognosis:  Patient/family is realistic regarding therapy needs and expressed being hopeful for SNF placement. Patient expressed understanding of CSW role and discharge process as well as medical condition. No questions/concerns about plan or treatment.    Emotional Assessment Appearance:  Appears stated age Attitude/Demeanor/Rapport:  Engaged Affect (typically observed):  Accepting, Appropriate Orientation:  Oriented to Self, Oriented to Situation, Oriented to Place, Oriented to  Time Alcohol / Substance use:  Not Applicable Psych involvement (Current and /or in the community):  No (Comment)  Discharge Needs  Concerns to be addressed:  Care Coordination Readmission within the last 30 days:  No Current discharge risk:  None Barriers to Discharge:  Continued Medical Work up   [employer], [place] 07/13/2017, 6:10 PM

## 2017-07-13 NOTE — Progress Notes (Signed)
Nutrition Consult/Brief Note  RD consulted for diet education.  Pt undergoing 2D-Echo upon visit.  RD to re-visit at later date.  Arthur Holms, RD, LDN Pager #: [phone] After-Hours Pager #: [phone]

## 2017-07-13 NOTE — NC FL2 (Signed)
[place] MEDICAID FL2 LEVEL OF CARE SCREENING TOOL     IDENTIFICATION  Patient Name: Jillian Eaton Birthdate: [birth year] Sex: female Admission Date (Current Location): 07/10/2017  [facility] and [place] Number:  Herbalist and Address:  [facility], [place] [address], [place], [place] [address]      Provider Number: [id number]  Attending Physician Name and Address:  Elmarie Shiley, MD  Relative Name and Phone Number:  Hubbard Robinson, spouse, [phone]    Current Level of Care: Hospital Recommended Level of Care: [place] Prior Approval Number:    Date Approved/Denied:   PASRR Number: [id number]  Discharge Plan: SNF    Current Diagnoses: Patient Active Problem List   Diagnosis Date Noted  . Hypercapnemia 07/13/2017  . AKI (acute kidney injury) ([place]) 07/10/2017  . On anticoagulant therapy 06/19/2017  . GERD (gastroesophageal reflux disease) 08/28/2016  . Hypothyroidism 08/28/2016  . Depression 08/28/2016  . Hypocalcemia 08/28/2016  . Chronic pain syndrome   . Fibromyalgia   . Asthma   . Acute on chronic diastolic CHF (congestive heart failure) ([place])   . Atrial fibrillation ([place]) 04/28/2016  . Vocal cord dysfunction 04/28/2016  . Thrombocytopenia ([place]) 04/28/2016  . Seizures ([place])   . Leg swelling 10/19/2015  . Acute on chronic respiratory failure with hypoxia and hypercapnia ([place]) 08/03/2015  . Bilateral leg edema 05/11/2015  . Essential hypertension 05/11/2015  . Pulmonary hypertension ([place]) 01/08/2013  . Anemia of chronic disease 03/28/2011  . Morbid obesity due to excess calories ([place]) 09/22/2010    Orientation RESPIRATION BLADDER Height & Weight     Self, Time, Situation, Place  O2(Nasal cannula 1.5 L, Bipap at night) Incontinent, External catheter Weight: (!) 154.9 kg (341 lb 7.9 oz) Height:  5\' 7"  (170.2 cm)  BEHAVIORAL SYMPTOMS/MOOD NEUROLOGICAL BOWEL NUTRITION STATUS      Continent Diet(Please see  DC Summary)  AMBULATORY STATUS COMMUNICATION OF NEEDS Skin   Limited Assist Verbally Normal                       Personal Care Assistance Level of Assistance  Bathing, Feeding, Dressing Bathing Assistance: Maximum assistance Feeding assistance: Independent Dressing Assistance: Limited assistance     Functional Limitations Info  Sight Sight Info: Impaired        SPECIAL CARE FACTORS FREQUENCY  PT (By licensed PT), OT (By licensed OT)     PT Frequency: 5x/week OT Frequency: 3x/week            Contractures      Additional Factors Info  Code Status, Allergies, Psychotropic Code Status Info: Full Allergies Info:  Norco Hydrocodone-acetaminophen Psychotropic Info: Cymbalta         Current Medications (07/13/2017):  This is the current hospital active medication list Current Facility-Administered Medications  Medication Dose Route Frequency Provider Last Rate Last Dose  . 0.9 %  sodium chloride infusion  250 mL Intravenous PRN Opyd, Ilene Qua, MD      . acetaminophen (TYLENOL) tablet 1,000 mg  1,000 mg Oral Q6H PRN Regalado, Belkys A, MD   1,000 mg at 07/12/17 2108   Or  . acetaminophen (TYLENOL) suppository 650 mg  650 mg Rectal Q6H PRN Regalado, Belkys A, MD      . albuterol (PROVENTIL) (2.5 MG/3ML) 0.083% nebulizer solution 2.5 mg  2.5 mg Nebulization TID Regalado, Belkys A, MD   2.5 mg at 07/13/17 0820  . apixaban (ELIQUIS) tablet 5 mg  5  mg Oral BID Vianne Bulls, MD   5 mg at 07/13/17 0758  . bisacodyl (DULCOLAX) EC tablet 5 mg  5 mg Oral Daily PRN Opyd, Ilene Qua, MD      . calcium carbonate (OS-CAL - dosed in mg of elemental calcium) tablet 1,250 mg  1,250 mg Oral TID WC Regalado, Belkys A, MD   1,250 mg at 07/13/17 1204  . carvedilol (COREG) tablet 6.25 mg  6.25 mg Oral BID WC Regalado, Belkys A, MD      . DULoxetine (CYMBALTA) DR capsule 60 mg  60 mg Oral Daily Opyd, Ilene Qua, MD   60 mg at 07/13/17 0759  . HYDROcodone-acetaminophen (NORCO/VICODIN)  5-325 MG per tablet 1 tablet  1 tablet Oral Q4H PRN Regalado, Belkys A, MD      . levothyroxine (SYNTHROID, LEVOTHROID) tablet 200 mcg  200 mcg Oral QAC breakfast Opyd, Ilene Qua, MD   200 mcg at 07/13/17 0758  . lip balm (BLISTEX) ointment 1 application  1 application Topical PRN Regalado, Belkys A, MD   1 application at 93/79/02 0604  . mirabegron ER (MYRBETRIQ) tablet 50 mg  50 mg Oral Daily Opyd, Ilene Qua, MD   50 mg at 07/13/17 0757  . mometasone-formoterol (DULERA) 200-5 MCG/ACT inhaler 2 puff  2 puff Inhalation BID Opyd, Ilene Qua, MD   2 puff at 07/13/17 0824  . ondansetron (ZOFRAN) tablet 4 mg  4 mg Oral Q6H PRN Opyd, Ilene Qua, MD       Or  . ondansetron (ZOFRAN) injection 4 mg  4 mg Intravenous Q6H PRN Opyd, Ilene Qua, MD      . pregabalin (LYRICA) capsule 200 mg  200 mg Oral TID Vianne Bulls, MD   200 mg at 07/13/17 0757  . senna-docusate (Senokot-S) tablet 1 tablet  1 tablet Oral QHS PRN Opyd, Ilene Qua, MD      . sodium chloride flush (NS) 0.9 % injection 3 mL  3 mL Intravenous Q12H Opyd, Ilene Qua, MD   3 mL at 07/12/17 0916  . sodium chloride flush (NS) 0.9 % injection 3 mL  3 mL Intravenous Q12H Opyd, Ilene Qua, MD   3 mL at 07/13/17 0800  . sodium chloride flush (NS) 0.9 % injection 3 mL  3 mL Intravenous PRN Opyd, Ilene Qua, MD      . Vitamin D (Ergocalciferol) (DRISDOL) capsule 50,000 Units  50,000 Units Oral Q7 days Regalado, Belkys A, MD   50,000 Units at 07/13/17 1205  . zolpidem (AMBIEN) tablet 5 mg  5 mg Oral QHS PRN Opyd, Ilene Qua, MD   5 mg at 07/12/17 2106     Discharge Medications: Please see discharge summary for a list of discharge medications.  Relevant Imaging Results:  Relevant Lab Results:   Additional Information SS#: [id number]  Benard Halsted, LCSWA

## 2017-07-13 NOTE — Progress Notes (Addendum)
PROGRESS NOTE    Jillian Eaton  SNK:[id number] DOB: December 12, 1950 DOA: 07/10/2017 PCP: Thornton Dales I, MD    Brief Narrative:  Jillian Eaton is a 67 y.o. female with medical history significant for BMI 54, asthma, chronic hypoxic and hypercarbic respiratory failure, chronic diastolic CHF, pulmonary hypertension, fibromyalgia, atrial fibrillation on Eliquis, and chronic anemia and thrombocytopenia, now presenting to the emergency department for evaluation of increased dyspnea and swelling.  Patient reports that the symptoms have been progressing over months, but worsened acutely over the past several days.  She has been in communication with her cardiologist regarding this and her Lasix was increased.  She reports some improvement with increased Lasix, but continues to have increased bilateral leg swelling and severe dyspnea with any exertion.  She denies fevers, chills, chest pain, or cough.  She denies wheezing.  She has not used her Symbicort or neb treatments at home in several months.  She follows with pulmonology at [facility] and is prescribed 3 L/min of supplemental oxygen around-the-clock.  ED Course: Upon arrival to the ED, patient is found to be afebrile, saturating 83% on room air, and with vitals otherwise stable.  EKG features atrial fibrillation with PVC and chest x-ray is notable for pulmonary vascular congestion without edema or consolidation.  Chemistry panel is notable for BUN of 32, creatinine 1.54, from 0.89 last month, and serum calcium of 5.5 with albumin level not available.  CBC is notable for stable normocytic anemia with hemoglobin of 9.7 and a stable thrombocytopenia with platelets 137,000.  Troponin is undetectable and BNP is elevated to 246.  ABG reveals pH 7.30, pCO2 69, and pO2 74.  Patient was given 1 g of IV calcium in the ED and started on BiPAP.  She remains hemodynamically stable and will be admitted to the stepdown unit for ongoing evaluation and management of acute  on chronic hypoxic and hypercarbic respiratory failure.     Assessment & Plan:   Principal Problem:   Hypercapnemia Active Problems:   Anemia of chronic disease   Pulmonary hypertension (HCC)   Acute on chronic respiratory failure with hypoxia and hypercapnia (HCC)   Atrial fibrillation (HCC)   Thrombocytopenia (HCC)   Asthma   Acute on chronic diastolic CHF (congestive heart failure) (HCC)   Hypothyroidism   Hypocalcemia   AKI (acute kidney injury) ([place])   1-Acute on chronic hypoxic and hypercarbic respiratory failure;  On chronic 3 L oxygen.  might be multifactorial; OHS, asthma, vs heart failure.  Continue with schedule nebulizer, IV solumedrol.  CCM consulted due to worsening hypercapnia and soft SBP and need for lasix. .  Follow pulmonary recommendations.  Needs BIPAP at HS.  CM to help arrange BIPAP/ for home  2-Acute on chronic diastolic CHF  Chest x ray with pulmonary vascular congestion, no frank pulmonary edema.  Cardiology consulted. Patient primary cardiologist has been adjusting lasix outpatient.  Received 40 mg IV on 5-23.  Will defer oral lasix dose to cardio./   3- Acute kidney injury  Improved, cr decreased to 1.3 this am.   4-Hypocalcemia  Continue with oral calcium supplementation.  IV supplement.  Check albumin in am.  PTH was normal/  Repeat Mg in am. Check vitamin d level.  Start vitamin d supplemnt.   Hypomagnesemia; replaced IV   5-Hypothyroidism On synthroid.   6-Fibromyalgia  - Continue Lyrica and Cymbalta  7-Atrial fibrillation  On xarelto, carvedilol.  Had to hold carvedilol this am due to soft, BP   DVT  prophylaxis: Xarelto  Code Status: full code.  Family Communication; no family at bedside.  Disposition Plan: step down unit. CCM evaluation    Consultants:   CCM   Procedures: none   Antimicrobials:   none   Subjective: She is alert this am, answering questions.  Breathing better.   Objective: Vitals:     07/13/17 0723 07/13/17 0730 07/13/17 0802 07/13/17 0820  BP: (!) 89/60  (!) 85/65   Pulse: 84 81 86   Resp: 16 16    Temp: 97.8 F (36.6 C)     TempSrc:      SpO2: 95% 94%  99%  Weight:      Height:        Intake/Output Summary (Last 24 hours) at 07/13/2017 1111 Last data filed at 07/13/2017 0900 Gross per 24 hour  Intake 1003 ml  Output 1550 ml  Net -547 ml   Filed Weights   07/12/17 0500 07/13/17 0344  Weight: (!) 156.7 kg (345 lb 7.4 oz) (!) 154.9 kg (341 lb 7.9 oz)    Examination:  General exam: Alert, in no distress Respiratory system: Normal respiratory effort. CTA Cardiovascular system:  S 1, S 2 RRR Gastrointestinal system: BS present, soft, nt Central nervous system: non focal.  Extremities: Symmetric power.  Skin: No rashes.     Data Reviewed: I have personally reviewed following labs and imaging studies  CBC: Recent Labs  Lab 07/10/17 1700 07/11/17 0219 07/12/17 0737  WBC 5.2 5.3 4.3  NEUTROABS 3.6 3.1  --   HGB 9.7* 9.8* 8.9*  HCT 32.8* 33.3* 31.0*  MCV 85.2 85.2 86.4  PLT 137* 131* 631   Basic Metabolic Panel: Recent Labs  Lab 07/10/17 2104 07/11/17 0219 07/11/17 1235 07/11/17 1902 07/12/17 0737 07/13/17 0314  NA  --  144 145 147* 145 143  K  --  4.2 4.1 4.4 4.3 4.2  CL  --  101 100* 100* 100* 98*  CO2  --  31 34* 35* 36* 36*  GLUCOSE  --  78 73 71 83 88  BUN  --  29* 26* 23* 23* 26*  CREATININE  --  1.31* 1.21* 1.08* 1.06* 1.19*  CALCIUM 5.5* 5.6* 5.8* 6.1* 5.9* 5.8*  MG 1.2*  --   --   --   --  1.3*  PHOS 7.4*  --   --   --   --   --    GFR: Estimated Creatinine Clearance: 73.6 mL/min (A) (by C-G formula based on SCr of 1.19 mg/dL (H)). Liver Function Tests: Recent Labs  Lab 07/10/17 2104 07/11/17 0219  AST 15 16  ALT 10* 9*  ALKPHOS 92 93  BILITOT 0.8 0.7  PROT 6.4* 6.6  ALBUMIN 3.0* 3.1*   No results for input(s): LIPASE, AMYLASE in the last 168 hours. No results for input(s): AMMONIA in the last 168  hours. Coagulation Profile: No results for input(s): INR, PROTIME in the last 168 hours. Cardiac Enzymes: No results for input(s): CKTOTAL, CKMB, CKMBINDEX, TROPONINI in the last 168 hours. BNP (last 3 results) Recent Labs    06/19/17 1222  PROBNP 2,308*   HbA1C: No results for input(s): HGBA1C in the last 72 hours. CBG: No results for input(s): GLUCAP in the last 168 hours. Lipid Profile: No results for input(s): CHOL, HDL, LDLCALC, TRIG, CHOLHDL, LDLDIRECT in the last 72 hours. Thyroid Function Tests: No results for input(s): TSH, T4TOTAL, FREET4, T3FREE, THYROIDAB in the last 72 hours. Anemia Panel: No results for input(s):  VITAMINB12, FOLATE, FERRITIN, TIBC, IRON, RETICCTPCT in the last 72 hours. Sepsis Labs: Recent Labs  Lab 07/11/17 8144 07/11/17 1256  LATICACIDVEN 0.6 0.6    Recent Results (from the past 240 hour(s))  MRSA PCR Screening     Status: None   Collection Time: 07/11/17  5:59 PM  Result Value Ref Range Status   MRSA by PCR NEGATIVE NEGATIVE Final    Comment:        The GeneXpert MRSA Assay (FDA approved for NASAL specimens only), is one component of a comprehensive MRSA colonization surveillance program. It is not intended to diagnose MRSA infection nor to guide or monitor treatment for MRSA infections. Performed at [facility], [place] [address], [place], [place] [address]          Radiology Studies: Dg Chest Port 1 View  Result Date: 07/12/2017 CLINICAL DATA:  Respiratory failure, short of breath EXAM: PORTABLE CHEST 1 VIEW COMPARISON:  Portable chest x-ray 07/10/2017 FINDINGS: Aeration of the lungs may have improved slightly. There is still moderate cardiomegaly present with mild pulmonary vascular congestion. No definite pleural effusion is seen. IMPRESSION: Slightly improved aeration. Stable cardiomegaly with pulmonary vascular congestion Electronically Signed   By: Ivar Drape M.D.   On: 07/12/2017 09:32        Scheduled  Meds: . albuterol  2.5 mg Nebulization TID  . apixaban  5 mg Oral BID  . calcium carbonate  1,250 mg Oral TID WC  . carvedilol  12.5 mg Oral BID WC  . DULoxetine  60 mg Oral Daily  . levothyroxine  200 mcg Oral QAC breakfast  . mirabegron ER  50 mg Oral Daily  . mometasone-formoterol  2 puff Inhalation BID  . pregabalin  200 mg Oral TID  . sodium chloride flush  3 mL Intravenous Q12H  . sodium chloride flush  3 mL Intravenous Q12H   Continuous Infusions: . sodium chloride       LOS: 3 days    Time spent: 35 minutes.     Elmarie Shiley, MD [facility] Pager [phone]  If 7PM-7AM, please contact night-coverage [email or web address] Password [email or web address] 07/13/2017, 11:11 AM

## 2017-07-13 NOTE — Care Management Important Message (Signed)
Important Message  Patient Details  Name: Jillian Eaton MRN: [id number] Date of Birth: 1950/03/09   Medicare Important Message Given:  Yes    Delorse Lek 07/13/2017, 3:19 PM

## 2017-07-13 NOTE — Progress Notes (Signed)
CRITICAL VALUE ALERT  Critical Value:  Calcium 5.8  Date & Time Notied:  07/13/17  Provider Notified: [place],NP   Orders Received/Actions taken:

## 2017-07-14 ENCOUNTER — Encounter (HOSPITAL_COMMUNITY): Payer: Self-pay | Admitting: *Deleted

## 2017-07-14 DIAGNOSIS — I272 Pulmonary hypertension, unspecified: Secondary | ICD-10-CM

## 2017-07-14 DIAGNOSIS — I481 Persistent atrial fibrillation: Secondary | ICD-10-CM

## 2017-07-14 LAB — URINALYSIS, ROUTINE W REFLEX MICROSCOPIC
Bilirubin Urine: NEGATIVE
GLUCOSE, UA: NEGATIVE mg/dL
Ketones, ur: NEGATIVE mg/dL
Nitrite: NEGATIVE
PROTEIN: 100 mg/dL — AB
SPECIFIC GRAVITY, URINE: 1.021 (ref 1.005–1.030)
pH: 5 (ref 5.0–8.0)

## 2017-07-14 LAB — HEPATIC FUNCTION PANEL
ALK PHOS: 85 U/L (ref 38–126)
ALT: 8 U/L — AB (ref 14–54)
AST: 16 U/L (ref 15–41)
Albumin: 2.6 g/dL — ABNORMAL LOW (ref 3.5–5.0)
BILIRUBIN DIRECT: 0.1 mg/dL (ref 0.1–0.5)
BILIRUBIN INDIRECT: 0.5 mg/dL (ref 0.3–0.9)
Total Bilirubin: 0.6 mg/dL (ref 0.3–1.2)
Total Protein: 5.7 g/dL — ABNORMAL LOW (ref 6.5–8.1)

## 2017-07-14 LAB — BASIC METABOLIC PANEL
ANION GAP: 9 (ref 5–15)
BUN: 24 mg/dL — AB (ref 6–20)
CALCIUM: 6 mg/dL — AB (ref 8.9–10.3)
CO2: 35 mmol/L — AB (ref 22–32)
Chloride: 101 mmol/L (ref 101–111)
Creatinine, Ser: 1.04 mg/dL — ABNORMAL HIGH (ref 0.44–1.00)
GFR calc Af Amer: >60 mL/min (ref >60)
GFR, EST NON AFRICAN AMERICAN: 55 mL/min — AB (ref >60)
GLUCOSE: 89 mg/dL (ref 65–99)
Potassium: 4.2 mmol/L (ref 3.5–5.1)
Sodium: 145 mmol/L (ref 135–145)

## 2017-07-14 LAB — MAGNESIUM: Magnesium: 1.6 mg/dL — ABNORMAL LOW (ref 1.7–2.4)

## 2017-07-14 LAB — HEMOGLOBIN AND HEMATOCRIT, BLOOD
HCT: 30.5 % — ABNORMAL LOW (ref 36.0–46.0)
HEMOGLOBIN: 8.9 g/dL — AB (ref 12.0–15.0)

## 2017-07-14 MED ORDER — ALBUTEROL SULFATE (2.5 MG/3ML) 0.083% IN NEBU
2.5000 mg | INHALATION_SOLUTION | Freq: Four times a day (QID) | RESPIRATORY_TRACT | Status: DC | PRN
Start: 2017-07-14 — End: 2017-07-17

## 2017-07-14 MED ORDER — SODIUM CHLORIDE 0.9 % IV SOLN
1.0000 g | Freq: Once | INTRAVENOUS | Status: AC
  Administered 2017-07-14: 1 g via INTRAVENOUS
  Filled 2017-07-14 (×2): qty 10

## 2017-07-14 MED ORDER — MAGNESIUM SULFATE IN D5W 1-5 GM/100ML-% IV SOLN
1.0000 g | Freq: Once | INTRAVENOUS | Status: AC
  Administered 2017-07-14: 1 g via INTRAVENOUS
  Filled 2017-07-14: qty 100

## 2017-07-14 MED ORDER — POLYETHYLENE GLYCOL 3350 17 G PO PACK
17.0000 g | PACK | Freq: Two times a day (BID) | ORAL | Status: DC
  Filled 2017-07-14 (×3): qty 1

## 2017-07-14 MED ORDER — MAGNESIUM SULFATE 2 GM/50ML IV SOLN
2.0000 g | Freq: Once | INTRAVENOUS | Status: AC
  Administered 2017-07-14: 2 g via INTRAVENOUS
  Filled 2017-07-14: qty 50

## 2017-07-14 MED ORDER — SENNA 8.6 MG PO TABS
1.0000 | ORAL_TABLET | Freq: Every day | ORAL | Status: DC
  Administered 2017-07-14 – 2017-07-17 (×4): 8.6 mg via ORAL
  Filled 2017-07-14 (×3): qty 1

## 2017-07-14 NOTE — Evaluation (Addendum)
Occupational Therapy Evaluation Patient Details Name: Jillian Eaton MRN: [id number] DOB: 1950/09/10 Today's Date: 07/14/2017    History of Present Illness 67 y.o. female with PMH of persistent atrial fibrillation on eliquis (new diagnosis 05/2017), chronic diastolic CHF, moderate pulmHTN, HTN, fibromyalgia, hypothyroidism, super morbid obesity, obesity hypoventilation syndrome with chronic hypoxic/hypercarbic respiratory failure, who presented with SOB. Cardiology following for acute on chronic diastolic CHF. Pulmonology following for acute on chronic hypoxic, hypercarbic respiratory failure.   Clinical Impression   Pt admitted for above. Pt able to manage ADLs, PTA. Feel pt will benefit from acute OT to increase independence prior to d/c. Recommending SNF at this time.    Follow Up Recommendations  SNF    Equipment Recommendations  Other (comment)(defer to next venue)    Recommendations for Other Services       Precautions / Restrictions Precautions Precautions: Fall Restrictions Weight Bearing Restrictions: No      Mobility Bed Mobility Overal bed mobility: Needs Assistance Bed Mobility: Supine to Sit;Sit to Supine     Supine to sit: Supervision Sit to supine: Mod assist   General bed mobility comments: assist with bilateral LEs to return to bed and used reverse trendelenburg to help pt scoot HOB.   Transfers Overall transfer level: Needs assistance Equipment used: Rolling walker (2 wheeled) Transfers: Sit to/from Stand Sit to Stand: Min assist;From elevated surface         General transfer comment: assist to lift to stand     Balance                                           ADL either performed or assessed with clinical judgement   ADL Overall ADL's : Needs assistance/impaired                     Lower Body Dressing: Sit to/from stand;Moderate assistance   Toilet Transfer: RW;Minimal assistance(sit to stand from elevated  bed)           Functional mobility during ADLs: Rolling walker(Min A-sit to stand transfer; Mod A-bed mobility) General ADL Comments: Pt reports she has sockaid at home-doesn't wear socks and has slip on shoes. Spoke with pt about benefit of going to SNF.      Vision         Perception     Praxis      Pertinent Vitals/Pain Pain Assessment: 0-10 Pain Score: 5  Pain Location: Rt wrist Pain Descriptors / Indicators: Sore Pain Intervention(s): Monitored during session  O2 on room air dropping to 80's-O2 placed on at end of session. HR increased to 120s in session.     Hand Dominance     Extremity/Trunk Assessment Upper Extremity Assessment Upper Extremity Assessment: Generalized weakness   Lower Extremity Assessment Lower Extremity Assessment: Defer to PT evaluation       Communication Communication Communication: No difficulties   Cognition Arousal/Alertness: Awake/alert Behavior During Therapy: WFL for tasks assessed/performed Overall Cognitive Status: No family/caregiver present to determine baseline cognitive functioning                                     General Comments       Exercises     Shoulder Instructions      Home Living Family/patient expects to be discharged  to:: Unsure Living Arrangements: Spouse/significant other Available Help at Discharge: Family;Available 24 hours/day Type of Home: House Home Access: Stairs to enter [employer] of Steps: 1 Entrance Stairs-Rails: None Home Layout: Two level;Bed/bath upstairs Alternate Level Stairs-Number of Steps: 7 Alternate Level Stairs-Rails: Right;Left;Can reach both Bathroom Shower/Tub: Tub/shower unit;Curtain   Bathroom Toilet: Handicapped height     Home Equipment: Tub bench;Walker - 2 wheels;Bedside commode;Wheelchair - Civil engineer, contracting: Sock aid;Long-handled sponge Additional Comments: Difficulty getting upstairs to bedroom.       Prior Functioning/Environment Level of Independence: Needs assistance  Gait / Transfers Assistance Needed: has been staying on second level unless goes to appt. ; spouse goes out of house with pt to assist with car transfer etc.  ADL's / Homemaking Assistance Needed: Able to get bathed with effort. Reports she doesn't wear socks-has slip on shoes            OT Problem List: Decreased strength;Decreased range of motion;Obesity;Pain;Decreased knowledge of use of DME or AE;Decreased knowledge of precautions;Impaired balance (sitting and/or standing);Decreased activity tolerance      OT Treatment/Interventions: Self-care/ADL training;DME and/or AE instruction;Therapeutic activities;Patient/family education;Balance training;Energy conservation;Therapeutic exercise    OT Goals(Current goals can be found in the care plan section) Acute Rehab OT Goals Patient Stated Goal: to be able to walk without walker OT Goal Formulation: With patient Time For Goal Achievement: 07/21/17 Potential to Achieve Goals: Good ADL Goals Pt Will Perform Lower Body Dressing: sit to/from stand;with min guard assist Pt Will Transfer to Toilet: bedside commode;ambulating;with min assist Pt Will Perform Toileting - Clothing Manipulation and hygiene: with min guard assist Additional ADL Goal #1: Pt will perform bed mobility (sit to supine) with Min A.  OT Frequency: Min 2X/week   Barriers to D/C:            Co-evaluation              AM-PAC PT "6 Clicks" Daily Activity     Outcome Measure Help from another person eating meals?: None Help from another person taking care of personal grooming?: A Little Help from another person toileting, which includes using toliet, bedpan, or urinal?: A Lot Help from another person bathing (including washing, rinsing, drying)?: A Little Help from another person to put on and taking off regular upper body clothing?: A Little Help from another person to put on and taking off  regular lower body clothing?: A Lot 6 Click Score: 17   End of Session Equipment Utilized During Treatment: Gait belt;Rolling walker;Other (comment)(placed pt on O2 at end of session.)  Activity Tolerance: Patient tolerated treatment well;Other (comment)(HR elevated) Patient left: in bed;with call bell/phone within reach;with bed alarm set  OT Visit Diagnosis: Muscle weakness (generalized) (M62.81)                Time: 3235-5732 OT Time Calculation (min): 26 min Charges:  OT General Charges $OT Visit: 1 Visit OT Evaluation $OT Eval Moderate Complexity: 1 Mod G-Codes:      Edie Vallandingham L Ismaeel Arvelo OTR/L 07/14/2017, 10:57 AM

## 2017-07-14 NOTE — Progress Notes (Signed)
CRITICAL VALUE ALERT  Critical Value:  Calcium 6.0  Date & Time Notied:  07/14/17- 0127 Provider Notified: [place]on Holter, NP  Orders Received/Actions taken: No new orders at this time

## 2017-07-14 NOTE — Progress Notes (Signed)
PROGRESS NOTE    Jillian Eaton  ASN:[id number] DOB: [birth year] DOA: 07/10/2017 PCP: Thornton Dales I, MD    Brief Narrative:  Jillian Eaton is a 67 y.o. female with medical history significant for BMI 54, asthma, chronic hypoxic and hypercarbic respiratory failure, chronic diastolic CHF, pulmonary hypertension, fibromyalgia, atrial fibrillation on Eliquis, and chronic anemia and thrombocytopenia, now presenting to the emergency department for evaluation of increased dyspnea and swelling.  Patient reports that the symptoms have been progressing over months, but worsened acutely over the past several days.  She has been in communication with her cardiologist regarding this and her Lasix was increased.  She reports some improvement with increased Lasix, but continues to have increased bilateral leg swelling and severe dyspnea with any exertion.  She denies fevers, chills, chest pain, or cough.  She denies wheezing.  She has not used her Symbicort or neb treatments at home in several months.  She follows with pulmonology at [facility] and is prescribed 3 L/min of supplemental oxygen around-the-clock.  ED Course: Upon arrival to the ED, patient is found to be afebrile, saturating 83% on room air, and with vitals otherwise stable.  EKG features atrial fibrillation with PVC and chest x-ray is notable for pulmonary vascular congestion without edema or consolidation.  Chemistry panel is notable for BUN of 32, creatinine 1.54, from 0.89 last month, and serum calcium of 5.5 with albumin level not available.  CBC is notable for stable normocytic anemia with hemoglobin of 9.7 and a stable thrombocytopenia with platelets 137,000.  Troponin is undetectable and BNP is elevated to 246.  ABG reveals pH 7.30, pCO2 69, and pO2 74.  Patient was given 1 g of IV calcium in the ED and started on BiPAP.  She remains hemodynamically stable and will be admitted to the stepdown unit for ongoing evaluation and management of acute  on chronic hypoxic and hypercarbic respiratory failure.     Assessment & Plan:   Principal Problem:   Hypercapnemia Active Problems:   Anemia of chronic disease   Pulmonary hypertension (HCC)   Acute on chronic respiratory failure with hypoxia and hypercapnia (HCC)   Atrial fibrillation (HCC)   Thrombocytopenia (HCC)   Asthma   Acute on chronic diastolic CHF (congestive heart failure) (HCC)   Hypothyroidism   Hypocalcemia   AKI (acute kidney injury) ([place])   1-Acute on chronic hypoxic and hypercarbic respiratory failure;  On chronic 3 L oxygen.  might be multifactorial; OHS, asthma, vs heart failure.  Continue with schedule nebulizer.  CCM consulted due to worsening hypercapnia and soft SBP and need for lasix. .  Pulmonary recommend Home BIPAP/  Needs BIPAP at HS.  CM to help arrange BIPAP/ for home Stable today, she use BIPAP last night.   2-Acute on chronic diastolic CHF  Chest x ray with pulmonary vascular congestion, no frank pulmonary edema.  Cardiology consulted. Patient primary cardiologist has been adjusting lasix outpatient.  Received 40 mg IV on 5-23.  Plan to continue to hold lasix due to soft BP  3- Acute kidney injury  Improved, Cr peak to 1.5 Cr decreased to 1.1.   4-Hypocalcemia  Continue with oral calcium supplementation.  IV supplement.  Calcium correction for albumin 6.8 PTH was normal/  Replete magnesium IV  Started vitamin d supplemnt.  Vitamin D level pending.   Hypomagnesemia; replaced IV   5-Hypothyroidism On synthroid.   6-Fibromyalgia  - Continue Lyrica and Cymbalta  7-Atrial fibrillation  On xarelto, carvedilol.  Decrease carvedilol  to 3.5  8-Hematuria;  Repeat UA tomorrow.  Would continue with eliquis, risk benefit discussed with patient.   9-Anemia;  Check anemia panel.  ? Hematuria.  Last hb per records; Baseline hb 10----9    DVT prophylaxis: Xarelto  Code Status: full code.  Family Communication; no family at  bedside.  Disposition Plan: step down unit. CCM evaluation    Consultants:   CCM   Procedures: none   Antimicrobials:   none   Subjective: She is sitting in chair. Breathing ok. Her urine turn orange yesterday. She has had this problem before.    Objective: Vitals:   07/14/17 0503 07/14/17 0755 07/14/17 0900 07/14/17 1312  BP: (!) 88/57 93/66 97/67  114/69  Pulse: 88 91  87  Resp: 12 16  18   Temp: 97.8 F (36.6 C) 98.4 F (36.9 C)  98 F (36.7 C)  TempSrc: Oral Oral  Oral  SpO2: 95% 91%  98%  Weight: (!) 155.7 kg (343 lb 4.1 oz)     Height:        Intake/Output Summary (Last 24 hours) at 07/14/2017 1325 Last data filed at 07/14/2017 0948 Gross per 24 hour  Intake 1423 ml  Output 950 ml  Net 473 ml   Filed Weights   07/12/17 0500 07/13/17 0344 07/14/17 0503  Weight: (!) 156.7 kg (345 lb 7.4 oz) (!) 154.9 kg (341 lb 7.9 oz) (!) 155.7 kg (343 lb 4.1 oz)    Examination:  General exam: NAD Respiratory system: CTA, normal respiratory effort.  Cardiovascular system:  S 1, S 2 RRR Gastrointestinal system: BS present, soft, nt Central nervous system: Non focal.  Extremities: Symmetric power.   Skin: No rashes.     Data Reviewed: I have personally reviewed following labs and imaging studies  CBC: Recent Labs  Lab 07/10/17 1700 07/11/17 0219 07/12/17 0737 07/14/17 0039  WBC 5.2 5.3 4.3  --   NEUTROABS 3.6 3.1  --   --   HGB 9.7* 9.8* 8.9* 8.9*  HCT 32.8* 33.3* 31.0* 30.5*  MCV 85.2 85.2 86.4  --   PLT 137* 131* 156  --    Basic Metabolic Panel: Recent Labs  Lab 07/10/17 2104  07/11/17 1235 07/11/17 1902 07/12/17 0737 07/13/17 0314 07/14/17 0039  NA  --    < > 145 147* 145 143 145  K  --    < > 4.1 4.4 4.3 4.2 4.2  CL  --    < > 100* 100* 100* 98* 101  CO2  --    < > 34* 35* 36* 36* 35*  GLUCOSE  --    < > 73 71 83 88 89  BUN  --    < > 26* 23* 23* 26* 24*  CREATININE  --    < > 1.21* 1.08* 1.06* 1.19* 1.04*  CALCIUM 5.5*   < > 5.8* 6.1*  5.9* 5.8* 6.0*  MG 1.2*  --   --   --   --  1.3* 1.6*  PHOS 7.4*  --   --   --   --   --   --    < > = values in this interval not displayed.   GFR: Estimated Creatinine Clearance: 84.5 mL/min (A) (by C-G formula based on SCr of 1.04 mg/dL (H)). Liver Function Tests: Recent Labs  Lab 07/10/17 2104 07/11/17 0219 07/14/17 0039  AST 15 16 16   ALT 10* 9* 8*  ALKPHOS 92 93 85  BILITOT 0.8 0.7 0.6  PROT 6.4* 6.6 5.7*  ALBUMIN 3.0* 3.1* 2.6*   No results for input(s): LIPASE, AMYLASE in the last 168 hours. No results for input(s): AMMONIA in the last 168 hours. Coagulation Profile: No results for input(s): INR, PROTIME in the last 168 hours. Cardiac Enzymes: No results for input(s): CKTOTAL, CKMB, CKMBINDEX, TROPONINI in the last 168 hours. BNP (last 3 results) Recent Labs    06/19/17 1222  PROBNP 2,308*   HbA1C: No results for input(s): HGBA1C in the last 72 hours. CBG: No results for input(s): GLUCAP in the last 168 hours. Lipid Profile: No results for input(s): CHOL, HDL, LDLCALC, TRIG, CHOLHDL, LDLDIRECT in the last 72 hours. Thyroid Function Tests: No results for input(s): TSH, T4TOTAL, FREET4, T3FREE, THYROIDAB in the last 72 hours. Anemia Panel: No results for input(s): VITAMINB12, FOLATE, FERRITIN, TIBC, IRON, RETICCTPCT in the last 72 hours. Sepsis Labs: Recent Labs  Lab 07/11/17 8469 07/11/17 1256  LATICACIDVEN 0.6 0.6    Recent Results (from the past 240 hour(s))  MRSA PCR Screening     Status: None   Collection Time: 07/11/17  5:59 PM  Result Value Ref Range Status   MRSA by PCR NEGATIVE NEGATIVE Final    Comment:        The GeneXpert MRSA Assay (FDA approved for NASAL specimens only), is one component of a comprehensive MRSA colonization surveillance program. It is not intended to diagnose MRSA infection nor to guide or monitor treatment for MRSA infections. Performed at [facility], [place] [address] [place], [place] [address]           Radiology Studies: No results found.      Scheduled Meds: . albuterol  2.5 mg Nebulization TID  . apixaban  5 mg Oral BID  . calcium carbonate  1,250 mg Oral TID WC  . carvedilol  6.25 mg Oral BID WC  . DULoxetine  60 mg Oral Daily  . ferrous sulfate  325 mg Oral TID WC  . levothyroxine  200 mcg Oral QAC breakfast  . mirabegron ER  50 mg Oral Daily  . mometasone-formoterol  2 puff Inhalation BID  . polyethylene glycol  17 g Oral BID  . pregabalin  200 mg Oral TID  . senna  1 tablet Oral Daily  . sodium chloride flush  3 mL Intravenous Q12H  . sodium chloride flush  3 mL Intravenous Q12H  . Vitamin D (Ergocalciferol)  50,000 Units Oral Q7 days   Continuous Infusions: . sodium chloride    . calcium gluconate       LOS: 4 days    Time spent: 35 minutes.     Elmarie Shiley, MD [facility] Pager [phone]  If 7PM-7AM, please contact night-coverage [email or web address] Password [email or web address] 07/14/2017, 1:25 PM

## 2017-07-14 NOTE — Progress Notes (Signed)
Progress Note  Patient Name: Jillian Eaton Date of Encounter: 07/14/2017  Primary Cardiologist: Dr. Pixie Casino  Subjective   Denies chest pain or palpitations at rest, currently wearing oxygen via nasal cannula.  Has not gotten up out of bed yet.  Inpatient Medications    Scheduled Meds: . albuterol  2.5 mg Nebulization TID  . apixaban  5 mg Oral BID  . calcium carbonate  1,250 mg Oral TID WC  . carvedilol  6.25 mg Oral BID WC  . DULoxetine  60 mg Oral Daily  . ferrous sulfate  325 mg Oral TID WC  . levothyroxine  200 mcg Oral QAC breakfast  . mirabegron ER  50 mg Oral Daily  . mometasone-formoterol  2 puff Inhalation BID  . pregabalin  200 mg Oral TID  . sodium chloride flush  3 mL Intravenous Q12H  . sodium chloride flush  3 mL Intravenous Q12H  . Vitamin D (Ergocalciferol)  50,000 Units Oral Q7 days   Continuous Infusions: . sodium chloride    . calcium gluconate     PRN Meds: sodium chloride, acetaminophen **OR** acetaminophen, bisacodyl, HYDROcodone-acetaminophen, lip balm, ondansetron **OR** ondansetron (ZOFRAN) IV, senna-docusate, sodium chloride flush, zolpidem   Vital Signs    Vitals:   07/14/17 0400 07/14/17 0503 07/14/17 0755 07/14/17 0900  BP:  (!) 88/57 93/66 97/67   Pulse: 71 88 91   Resp: (!) 23 12 16    Temp:  97.8 F (36.6 C) 98.4 F (36.9 C)   TempSrc:  Oral Oral   SpO2: 99% 95% 91%   Weight:  (!) 343 lb 4.1 oz (155.7 kg)    Height:        Intake/Output Summary (Last 24 hours) at 07/14/2017 1111 Last data filed at 07/14/2017 0948 Gross per 24 hour  Intake 1423 ml  Output 950 ml  Net 473 ml   Filed Weights   07/12/17 0500 07/13/17 0344 07/14/17 0503  Weight: (!) 345 lb 7.4 oz (156.7 kg) (!) 341 lb 7.9 oz (154.9 kg) (!) 343 lb 4.1 oz (155.7 kg)    Telemetry    Atrial fibrillation.  Personally reviewed.  ECG    Tracing from 07/10/2017 showed atrial fibrillation with anterior T wave inversions.  Personally  reviewed.  Physical Exam   GEN:  Obese woman, no acute distress.   Neck: No JVD. Cardiac:  Irregularly irregular, no gallop.  Respiratory: Nonlabored.  Dereased breath sounds without wheezing. GI:  Obese, soft, nontender, bowel sounds present. MS: No edema; No deformity. Neuro:  Nonfocal. Psych: Alert and oriented x 3. Normal affect.  Labs    Chemistry Recent Labs  Lab 07/10/17 2104 07/11/17 0219  07/12/17 0737 07/13/17 0314 07/14/17 0039  NA  --  144   < > 145 143 145  K  --  4.2   < > 4.3 4.2 4.2  CL  --  101   < > 100* 98* 101  CO2  --  31   < > 36* 36* 35*  GLUCOSE  --  78   < > 83 88 89  BUN  --  29*   < > 23* 26* 24*  CREATININE  --  1.31*   < > 1.06* 1.19* 1.04*  CALCIUM 5.5* 5.6*   < > 5.9* 5.8* 6.0*  PROT 6.4* 6.6  --   --   --  5.7*  ALBUMIN 3.0* 3.1*  --   --   --  2.6*  AST 15 16  --   --   --  16  ALT 10* 9*  --   --   --  8*  ALKPHOS 92 93  --   --   --  85  BILITOT 0.8 0.7  --   --   --  0.6  GFRNONAA  --  42*   < > 54* 47* 55*  GFRAA  --  48*   < > >60 54* >60  ANIONGAP  --  12   < > 9 9 9    < > = values in this interval not displayed.     Hematology Recent Labs  Lab 07/10/17 1700 07/11/17 0219 07/12/17 0737 07/14/17 0039  WBC 5.2 5.3 4.3  --   RBC 3.85* 3.91 3.59*  --   HGB 9.7* 9.8* 8.9* 8.9*  HCT 32.8* 33.3* 31.0* 30.5*  MCV 85.2 85.2 86.4  --   MCH 25.2* 25.1* 24.8*  --   MCHC 29.6* 29.4* 28.7*  --   RDW 16.8* 16.9* 16.6*  --   PLT 137* 131* 156  --     Cardiac EnzymesNo results for input(s): TROPONINI in the last 168 hours.  Recent Labs  Lab 07/10/17 1706  TROPIPOC 0.00     BNP Recent Labs  Lab 07/10/17 1700  BNP 246.2*     Radiology    No results found.  Cardiac Studies   Echocardiogram 07/13/2017: Study Conclusions  - Procedure narrative: Transthoracic echocardiography. Technically   difficult study with reduced echocardiographic windows due to   body habitus. - Left ventricle: Systolic function was normal.  The estimated   ejection fraction was in the range of 60% to 65%. The study is   not technically sufficient to allow evaluation of LV diastolic   function. - Aortic valve: Trileaflet. Sclerosis without stenosis. There was   no regurgitation. - Mitral valve: Poorly visualized. Mildly thickened leaflets .   There was mild to moderate regurgitation. - Left atrium: Severely dilated. - Right ventricle: The cavity size was moderately dilated. Mildly   reduced systolic function. - Right atrium: Severely dilated. - Tricuspid valve: There was mild regurgitation. - Pulmonary arteries: PA peak pressure: 52 mm Hg (S). - Inferior vena cava: The vessel was dilated. The respirophasic   diameter changes were blunted (< 50%), consistent with elevated   central venous pressure.  Impressions:  - Compared to a prior study earlier this month, the LVEF is similar   at 60-65%. RVSP is higher at 52 mmHg. There is severe biatrial   enlargement.  Patient Profile     67 y.o. female with PMH of persistent atrial fibrillation on eliquis (new diagnosis 05/2017), chronic diastolic CHF, moderate pulmHTN, HTN, fibromyalgia, hypothyroidism, super morbid obesity, obesity hypoventilation syndrome with chronic hypoxic/hypercarbic respiratory failure, who presented with SOB. Cardiology following for acute on chronic diastolic CHF.  Assessment & Plan    1.  Persistent atrial fibrillation.  She is currently on Coreg 6.25 mg twice daily with reasonable heart rate control at rest, although heart rate does go up on ambulation. She is on Eliquis for stroke prophylaxis at this time.  2.  Chronic hypoxic/hypercarbic respiratory failure with obesity hypoventilation syndrome.  She requires supplemental oxygen, BiPAP at nighttime.  This significantly raises the likelihood of recurrent atrial arrhythmias.  3.  Acute on chronic diastolic heart failure.  Most recent echocardiogram shows LVEF 60 to 65%.  4.  History of  hypertension, recently with low blood pressures limiting titration of medical therapy heart rate control.  5.  Hematuria.  She states  that she had bleeding problems when on Xarelto originally.  Further work-up per primary team.  I reviewed the chart and follow-up by cardiology team.  I had a long discussion with the patient updating her on current status and cardiology plans as well as rationale for medical therapy.  General plan has been for cardioversion in 3 to 4 weeks after adequate treatment on Eliquis rather than pursuing a TEE cardioversion in light of problems with hypoxic/hypercarbic respiratory failure at baseline.  If she did have to undergo a TEE cardioversion, this would be best accomplished by having anesthesia manage sedation.  Her rate control is adequate at rest on Coreg 6.25 mg twice daily but does increase with activity.  Digoxin could be loaded next if heart rate control strategy needs to be pursued over the next few weeks.  If hematuria requires further work-up and discontinuation of Eliquis, this will also delay attempt at cardioversion.  Signed, Rozann Lesches, MD  07/14/2017, 11:11 AM

## 2017-07-14 NOTE — Progress Notes (Signed)
No acute events overnight.  Patient on bipap from 2300-0500

## 2017-07-14 NOTE — Clinical Social Work Note (Signed)
Patient has no bed offers at this time.  Bryndon Cumbie, CSW [phone]  

## 2017-07-15 DIAGNOSIS — D638 Anemia in other chronic diseases classified elsewhere: Secondary | ICD-10-CM

## 2017-07-15 DIAGNOSIS — I480 Paroxysmal atrial fibrillation: Secondary | ICD-10-CM

## 2017-07-15 DIAGNOSIS — N179 Acute kidney failure, unspecified: Secondary | ICD-10-CM

## 2017-07-15 LAB — MAGNESIUM: Magnesium: 2.1 mg/dL (ref 1.7–2.4)

## 2017-07-15 LAB — CBC
HCT: 32.3 % — ABNORMAL LOW (ref 36.0–46.0)
Hemoglobin: 9.3 g/dL — ABNORMAL LOW (ref 12.0–15.0)
MCH: 24.7 pg — ABNORMAL LOW (ref 26.0–34.0)
MCHC: 28.8 g/dL — ABNORMAL LOW (ref 30.0–36.0)
MCV: 85.9 fL (ref 78.0–100.0)
Platelets: 181 K/uL (ref 150–400)
RBC: 3.76 MIL/uL — ABNORMAL LOW (ref 3.87–5.11)
RDW: 16.6 % — ABNORMAL HIGH (ref 11.5–15.5)
WBC: 5.2 K/uL (ref 4.0–10.5)

## 2017-07-15 LAB — IRON AND TIBC
Iron: 28 ug/dL (ref 28–170)
Saturation Ratios: 6 % — ABNORMAL LOW (ref 10.4–31.8)
TIBC: 433 ug/dL (ref 250–450)
UIBC: 405 ug/dL

## 2017-07-15 LAB — BASIC METABOLIC PANEL WITH GFR
Anion gap: 7 (ref 5–15)
BUN: 20 mg/dL (ref 6–20)
CO2: 34 mmol/L — ABNORMAL HIGH (ref 22–32)
Calcium: 6.2 mg/dL — CL (ref 8.9–10.3)
Chloride: 99 mmol/L — ABNORMAL LOW (ref 101–111)
Creatinine, Ser: 0.98 mg/dL (ref 0.44–1.00)
GFR calc Af Amer: >60 mL/min
GFR calc non Af Amer: 59 mL/min — ABNORMAL LOW
Glucose, Bld: 84 mg/dL (ref 65–99)
Potassium: 4.3 mmol/L (ref 3.5–5.1)
Sodium: 140 mmol/L (ref 135–145)

## 2017-07-15 LAB — VITAMIN B12: Vitamin B-12: 233 pg/mL (ref 180–914)

## 2017-07-15 LAB — FERRITIN: Ferritin: 15 ng/mL (ref 11–307)

## 2017-07-15 MED ORDER — AMIODARONE HCL 200 MG PO TABS
400.0000 mg | ORAL_TABLET | Freq: Two times a day (BID) | ORAL | Status: DC
  Administered 2017-07-15 – 2017-07-17 (×5): 400 mg via ORAL
  Filled 2017-07-15 (×5): qty 2

## 2017-07-15 MED ORDER — FUROSEMIDE 10 MG/ML IJ SOLN
40.0000 mg | Freq: Once | INTRAMUSCULAR | Status: AC
  Administered 2017-07-15: 40 mg via INTRAVENOUS
  Filled 2017-07-15: qty 4

## 2017-07-15 MED ORDER — SODIUM CHLORIDE 0.9 % IV SOLN
1.0000 g | Freq: Once | INTRAVENOUS | Status: AC
  Administered 2017-07-15: 1 g via INTRAVENOUS
  Filled 2017-07-15: qty 10

## 2017-07-15 MED ORDER — CARVEDILOL 3.125 MG PO TABS
3.1250 mg | ORAL_TABLET | Freq: Two times a day (BID) | ORAL | Status: DC
  Administered 2017-07-15 – 2017-07-17 (×4): 3.125 mg via ORAL
  Filled 2017-07-15 (×4): qty 1

## 2017-07-15 MED ORDER — CYANOCOBALAMIN 1000 MCG/ML IJ SOLN
1000.0000 ug | Freq: Once | INTRAMUSCULAR | Status: AC
  Administered 2017-07-15: 1000 ug via INTRAMUSCULAR
  Filled 2017-07-15: qty 1

## 2017-07-15 NOTE — Progress Notes (Signed)
PROGRESS NOTE    Jillian Eaton  IEP:[id number] DOB: Mar 23, 1950 DOA: 07/10/2017 PCP: Thornton Dales I, MD    Brief Narrative:  Jillian Eaton is a 67 y.o. female with medical history significant for BMI 54, asthma, chronic hypoxic and hypercarbic respiratory failure, chronic diastolic CHF, pulmonary hypertension, fibromyalgia, atrial fibrillation on Eliquis, and chronic anemia and thrombocytopenia, now presenting to the emergency department for evaluation of increased dyspnea and swelling.  Patient reports that the symptoms have been progressing over months, but worsened acutely over the past several days.  She has been in communication with her cardiologist regarding this and her Lasix was increased.  She reports some improvement with increased Lasix, but continues to have increased bilateral leg swelling and severe dyspnea with any exertion.  She denies fevers, chills, chest pain, or cough.  She denies wheezing.  She has not used her Symbicort or neb treatments at home in several months.  She follows with pulmonology at [facility] and is prescribed 3 L/min of supplemental oxygen around-the-clock.  ED Course: Upon arrival to the ED, patient is found to be afebrile, saturating 83% on room air, and with vitals otherwise stable.  EKG features atrial fibrillation with PVC and chest x-ray is notable for pulmonary vascular congestion without edema or consolidation.  Chemistry panel is notable for BUN of 32, creatinine 1.54, from 0.89 last month, and serum calcium of 5.5 with albumin level not available.  CBC is notable for stable normocytic anemia with hemoglobin of 9.7 and a stable thrombocytopenia with platelets 137,000.  Troponin is undetectable and BNP is elevated to 246.  ABG reveals pH 7.30, pCO2 69, and pO2 74.  Patient was given 1 g of IV calcium in the ED and started on BiPAP.  She remains hemodynamically stable and will be admitted to the stepdown unit for ongoing evaluation and management of acute  on chronic hypoxic and hypercarbic respiratory failure.     Assessment & Plan:   Principal Problem:   Hypercapnemia Active Problems:   Anemia of chronic disease   Pulmonary hypertension (HCC)   Acute on chronic respiratory failure with hypoxia and hypercapnia (HCC)   Atrial fibrillation (HCC)   Thrombocytopenia (HCC)   Asthma   Acute on chronic diastolic CHF (congestive heart failure) (HCC)   Hypothyroidism   Hypocalcemia   AKI (acute kidney injury) ([place])   1-Acute on chronic hypoxic and hypercarbic respiratory failure;  On chronic 3 L oxygen.  might be multifactorial; OHS, asthma, vs heart failure.  Continue with schedule nebulizer.  CCM consulted due to worsening hypercapnia and soft SBP and need for lasix. .  Pulmonary recommend Home BIPAP/  Needs BIPAP at HS.  CM to help arrange BIPAP/ for home Continue to use BIPAP/   2-Acute on chronic diastolic CHF  Chest x ray with pulmonary vascular congestion, no frank pulmonary edema.  Cardiology consulted. Patient primary cardiologist has been adjusting lasix outpatient.  Received 40 mg IV on 5-23.  Appreciate Dr [name]  help/ Patient will received IV lasix today.   3- Acute kidney injury  Improved, Cr peak to 1.5 Cr decreased to 1.1.   4-Hypocalcemia  Continue with oral calcium supplementation.  IV supplement. PTH was normal/  Mg normal  Started vitamin d supplemnt.  Vitamin D level pending.   Hypomagnesemia; mg 2 resolved.   5-Hypothyroidism On synthroid.   6-Fibromyalgia  - Continue Lyrica and Cymbalta  7-Atrial fibrillation  On xarelto, carvedilol.  Decrease carvedilol to 3.5 Plan to start amiodarone  8-Hematuria;  Repeat UA tomorrow.  Would continue with eliquis, risk benefit discussed with patient.  Hb stable.   9-Anemia; iron deficiency and B 12 deficiency.  anemia panel.  ? Hematuria.  Last hb per records; Baseline hb 10----9 Started on oral iron. B 12 injection today    DVT prophylaxis:  Xarelto  Code Status: full code.  Family Communication; no family at bedside.  Disposition Plan: step down unit. CCM evaluation    Consultants:   CCM   Procedures: none   Antimicrobials:   none   Subjective: She use BIPAP last night.  She felt sleepy this am, now she is feeling well.  She is alert.  She is breathing ok.    Objective: Vitals:   07/14/17 2335 07/15/17 0304 07/15/17 0500 07/15/17 0736  BP: 98/74 (!) 89/62  (!) 96/54  Pulse: (!) 103 87  89  Resp: 15 12  16   Temp: (!) 97.5 F (36.4 C) (!) 97.4 F (36.3 C)  98.1 F (36.7 C)  TempSrc: Axillary   Oral  SpO2: 99% 98%  99%  Weight:   (!) 155.7 kg (343 lb 4.1 oz)   Height:        Intake/Output Summary (Last 24 hours) at 07/15/2017 1223 Last data filed at 07/14/2017 2338 Gross per 24 hour  Intake 240 ml  Output 900 ml  Net -660 ml   Filed Weights   07/13/17 0344 07/14/17 0503 07/15/17 0500  Weight: (!) 154.9 kg (341 lb 7.9 oz) (!) 155.7 kg (343 lb 4.1 oz) (!) 155.7 kg (343 lb 4.1 oz)    Examination:  General exam: Morbid obese, NAD Respiratory system: Normal respiratory effort. Crackles bases.  Cardiovascular system:  S 1, S 2 RRR Gastrointestinal system: BS present, nt Central nervous system: NON focal.  Extremities:Symmetrci power.  Skin: No rashes.     Data Reviewed: I have personally reviewed following labs and imaging studies  CBC: Recent Labs  Lab 07/10/17 1700 07/11/17 0219 07/12/17 0737 07/14/17 0039 07/15/17 0239  WBC 5.2 5.3 4.3  --  5.2  NEUTROABS 3.6 3.1  --   --   --   HGB 9.7* 9.8* 8.9* 8.9* 9.3*  HCT 32.8* 33.3* 31.0* 30.5* 32.3*  MCV 85.2 85.2 86.4  --  85.9  PLT 137* 131* 156  --  854   Basic Metabolic Panel: Recent Labs  Lab 07/10/17 2104  07/11/17 1902 07/12/17 0737 07/13/17 0314 07/14/17 0039 07/15/17 0239  NA  --    < > 147* 145 143 145 140  K  --    < > 4.4 4.3 4.2 4.2 4.3  CL  --    < > 100* 100* 98* 101 99*  CO2  --    < > 35* 36* 36* 35* 34*    GLUCOSE  --    < > 71 83 88 89 84  BUN  --    < > 23* 23* 26* 24* 20  CREATININE  --    < > 1.08* 1.06* 1.19* 1.04* 0.98  CALCIUM 5.5*   < > 6.1* 5.9* 5.8* 6.0* 6.2*  MG 1.2*  --   --   --  1.3* 1.6* 2.1  PHOS 7.4*  --   --   --   --   --   --    < > = values in this interval not displayed.   GFR: Estimated Creatinine Clearance: 89.6 mL/min (by C-G formula based on SCr of 0.98 mg/dL). Liver Function  Tests: Recent Labs  Lab 07/10/17 2104 07/11/17 0219 07/14/17 0039  AST 15 16 16   ALT 10* 9* 8*  ALKPHOS 92 93 85  BILITOT 0.8 0.7 0.6  PROT 6.4* 6.6 5.7*  ALBUMIN 3.0* 3.1* 2.6*   No results for input(s): LIPASE, AMYLASE in the last 168 hours. No results for input(s): AMMONIA in the last 168 hours. Coagulation Profile: No results for input(s): INR, PROTIME in the last 168 hours. Cardiac Enzymes: No results for input(s): CKTOTAL, CKMB, CKMBINDEX, TROPONINI in the last 168 hours. BNP (last 3 results) Recent Labs    06/19/17 1222  PROBNP 2,308*   HbA1C: No results for input(s): HGBA1C in the last 72 hours. CBG: No results for input(s): GLUCAP in the last 168 hours. Lipid Profile: No results for input(s): CHOL, HDL, LDLCALC, TRIG, CHOLHDL, LDLDIRECT in the last 72 hours. Thyroid Function Tests: No results for input(s): TSH, T4TOTAL, FREET4, T3FREE, THYROIDAB in the last 72 hours. Anemia Panel: Recent Labs    07/15/17 0239  VITAMINB12 233  FERRITIN 15  TIBC 433  IRON 28   Sepsis Labs: Recent Labs  Lab 07/11/17 0928 07/11/17 1256  LATICACIDVEN 0.6 0.6    Recent Results (from the past 240 hour(s))  MRSA PCR Screening     Status: None   Collection Time: 07/11/17  5:59 PM  Result Value Ref Range Status   MRSA by PCR NEGATIVE NEGATIVE Final    Comment:        The GeneXpert MRSA Assay (FDA approved for NASAL specimens only), is one component of a comprehensive MRSA colonization surveillance program. It is not intended to diagnose MRSA infection nor to guide  or monitor treatment for MRSA infections. Performed at [facility], [place] [address] [place], [place] [address]          Radiology Studies: No results found.      Scheduled Meds: . amiodarone  400 mg Oral BID  . apixaban  5 mg Oral BID  . calcium carbonate  1,250 mg Oral TID WC  . carvedilol  3.125 mg Oral BID WC  . cyanocobalamin  1,000 mcg Intramuscular Once  . DULoxetine  60 mg Oral Daily  . ferrous sulfate  325 mg Oral TID WC  . levothyroxine  200 mcg Oral QAC breakfast  . mirabegron ER  50 mg Oral Daily  . mometasone-formoterol  2 puff Inhalation BID  . polyethylene glycol  17 g Oral BID  . pregabalin  200 mg Oral TID  . senna  1 tablet Oral Daily  . sodium chloride flush  3 mL Intravenous Q12H  . sodium chloride flush  3 mL Intravenous Q12H  . Vitamin D (Ergocalciferol)  50,000 Units Oral Q7 days   Continuous Infusions: . sodium chloride       LOS: 5 days    Time spent: 35 minutes.     Elmarie Shiley, MD [facility] Pager [phone]  If 7PM-7AM, please contact night-coverage [email or web address] Password [facility] 07/15/2017, 12:23 PM

## 2017-07-15 NOTE — Progress Notes (Signed)
Progress Note  Patient Name: Jillian Eaton Date of Encounter: 07/15/2017  Primary Cardiologist: Dr. Pixie Casino  Subjective   Net negative overnight - echo personally reviewed, LVEF 60-65%, however, RVSP higher at 52 mmHg with a dilated IVC suggestive of volume overload. There is severe biatrial enlargement. Blood pressure remains in the 90's. Holding lasix due to hypotension.  Inpatient Medications    Scheduled Meds: . apixaban  5 mg Oral BID  . calcium carbonate  1,250 mg Oral TID WC  . carvedilol  6.25 mg Oral BID WC  . cyanocobalamin  1,000 mcg Intramuscular Once  . DULoxetine  60 mg Oral Daily  . ferrous sulfate  325 mg Oral TID WC  . levothyroxine  200 mcg Oral QAC breakfast  . mirabegron ER  50 mg Oral Daily  . mometasone-formoterol  2 puff Inhalation BID  . polyethylene glycol  17 g Oral BID  . pregabalin  200 mg Oral TID  . senna  1 tablet Oral Daily  . sodium chloride flush  3 mL Intravenous Q12H  . sodium chloride flush  3 mL Intravenous Q12H  . Vitamin D (Ergocalciferol)  50,000 Units Oral Q7 days   Continuous Infusions: . sodium chloride    . calcium gluconate     PRN Meds: sodium chloride, acetaminophen **OR** acetaminophen, albuterol, bisacodyl, HYDROcodone-acetaminophen, lip balm, ondansetron **OR** ondansetron (ZOFRAN) IV, sodium chloride flush, zolpidem   Vital Signs    Vitals:   07/14/17 2335 07/15/17 0304 07/15/17 0500 07/15/17 0736  BP: 98/74 (!) 89/62  (!) 96/54  Pulse: (!) 103 87  89  Resp: 15 12  16   Temp: (!) 97.5 F (36.4 C) (!) 97.4 F (36.3 C)  98.1 F (36.7 C)  TempSrc: Axillary   Oral  SpO2: 99% 98%  99%  Weight:   (!) 343 lb 4.1 oz (155.7 kg)   Height:        Intake/Output Summary (Last 24 hours) at 07/15/2017 1000 Last data filed at 07/14/2017 2338 Gross per 24 hour  Intake 240 ml  Output 900 ml  Net -660 ml   Filed Weights   07/13/17 0344 07/14/17 0503 07/15/17 0500  Weight: (!) 341 lb 7.9 oz (154.9 kg) (!) 343  lb 4.1 oz (155.7 kg) (!) 343 lb 4.1 oz (155.7 kg)    Telemetry    Atrial fibrillation with RVR.  Personally reviewed.  ECG    N/A  Physical Exam   General appearance: alert and no distress Neck: no carotid bruit, no JVD and thyroid not enlarged, symmetric, no tenderness/mass/nodules Lungs: diminished breath sounds bilaterally Heart: irregularly irregular rhythm Abdomen: soft, non-tender; bowel sounds normal; no masses,  no organomegaly and morbidly obese Extremities: edema 1+ edema Pulses: 2+ and symmetric Skin: Skin color, texture, turgor normal. No rashes or lesions Neurologic: Grossly normal Psych: Pleasant.  Labs    Chemistry Recent Labs  Lab 07/10/17 2104 07/11/17 0219  07/13/17 0314 07/14/17 0039 07/15/17 0239  NA  --  144   < > 143 145 140  K  --  4.2   < > 4.2 4.2 4.3  CL  --  101   < > 98* 101 99*  CO2  --  31   < > 36* 35* 34*  GLUCOSE  --  78   < > 88 89 84  BUN  --  29*   < > 26* 24* 20  CREATININE  --  1.31*   < > 1.19* 1.04* 0.98  CALCIUM 5.5* 5.6*   < > 5.8* 6.0* 6.2*  PROT 6.4* 6.6  --   --  5.7*  --   ALBUMIN 3.0* 3.1*  --   --  2.6*  --   AST 15 16  --   --  16  --   ALT 10* 9*  --   --  8*  --   ALKPHOS 92 93  --   --  85  --   BILITOT 0.8 0.7  --   --  0.6  --   GFRNONAA  --  42*   < > 47* 55* 59*  GFRAA  --  48*   < > 54* >60 >60  ANIONGAP  --  12   < > 9 9 7    < > = values in this interval not displayed.     Hematology Recent Labs  Lab 07/11/17 0219 07/12/17 0737 07/14/17 0039 07/15/17 0239  WBC 5.3 4.3  --  5.2  RBC 3.91 3.59*  --  3.76*  HGB 9.8* 8.9* 8.9* 9.3*  HCT 33.3* 31.0* 30.5* 32.3*  MCV 85.2 86.4  --  85.9  MCH 25.1* 24.8*  --  24.7*  MCHC 29.4* 28.7*  --  28.8*  RDW 16.9* 16.6*  --  16.6*  PLT 131* 156  --  181    Cardiac EnzymesNo results for input(s): TROPONINI in the last 168 hours.  Recent Labs  Lab 07/10/17 1706  TROPIPOC 0.00     BNP Recent Labs  Lab 07/10/17 1700  BNP 246.2*     Radiology      No results found.  Cardiac Studies   Echocardiogram 07/13/2017: Study Conclusions  - Procedure narrative: Transthoracic echocardiography. Technically   difficult study with reduced echocardiographic windows due to   body habitus. - Left ventricle: Systolic function was normal. The estimated   ejection fraction was in the range of 60% to 65%. The study is   not technically sufficient to allow evaluation of LV diastolic   function. - Aortic valve: Trileaflet. Sclerosis without stenosis. There was   no regurgitation. - Mitral valve: Poorly visualized. Mildly thickened leaflets .   There was mild to moderate regurgitation. - Left atrium: Severely dilated. - Right ventricle: The cavity size was moderately dilated. Mildly   reduced systolic function. - Right atrium: Severely dilated. - Tricuspid valve: There was mild regurgitation. - Pulmonary arteries: PA peak pressure: 52 mm Hg (S). - Inferior vena cava: The vessel was dilated. The respirophasic   diameter changes were blunted (< 50%), consistent with elevated   central venous pressure.  Impressions:  - Compared to a prior study earlier this month, the LVEF is similar   at 60-65%. RVSP is higher at 52 mmHg. There is severe biatrial   enlargement.  Patient Profile     67 y.o. female with PMH of persistent atrial fibrillation on eliquis (new diagnosis 05/2017), chronic diastolic CHF, moderate pulmHTN, HTN, fibromyalgia, hypothyroidism, super morbid obesity, obesity hypoventilation syndrome with chronic hypoxic/hypercarbic respiratory failure, who presented with SOB. Cardiology following for acute on chronic diastolic CHF.  Assessment & Plan    1.  Persistent atrial fibrillation.  She is currently on Coreg 6.25 mg twice daily with reasonable heart rate control at rest, although heart rate does go up on ambulation. She is on Eliquis for stroke prophylaxis at this time. Given severe biatrial enlargement, she is unlikely to achieve  or maintain sinus rhythm without antiarrhythmic therapy.    - Decrease  coreg to 3.125 mg BID  - Start amiodarone 400 mg BID x 1 week, then reduce to 400 mg daily  - Plan outpatient cardioversion in 3-4 weeks after consecutive anticoagulation on   Eliquis with no missed doses  2.  Chronic hypoxic/hypercarbic respiratory failure with obesity hypoventilation syndrome.  She requires supplemental oxygen, BiPAP at nighttime.  This significantly raises the likelihood of recurrent atrial arrhythmias.  -Appreciate pulmonary recs, plan to arrange home BIPAP   3.  Acute on chronic diastolic heart failure.  Most recent echocardiogram shows LVEF 60 to 65%.  -Remains volume overloaded - resume lasix.  - Decrease coreg to allow improved BP  5.  Hematuria.  She states that she had bleeding problems when on Xarelto originally.  Further work-up per primary team.  - Switched to Eliquis - hematuria improving  Pixie Casino, MD, [facility], [place] Director of the [facility]  [facility] Diplomate of the American Board of Clinical Lipidology Attending Cardiologist  Direct Dial: [phone]  Fax: [phone]  Website:  [email or web address]  Pixie Casino, MD  07/15/2017, 10:00 AM

## 2017-07-16 LAB — CBC
HEMATOCRIT: 32.4 % — AB (ref 36.0–46.0)
HEMOGLOBIN: 9.3 g/dL — AB (ref 12.0–15.0)
MCH: 24.6 pg — ABNORMAL LOW (ref 26.0–34.0)
MCHC: 28.7 g/dL — ABNORMAL LOW (ref 30.0–36.0)
MCV: 85.7 fL (ref 78.0–100.0)
Platelets: 189 10*3/uL (ref 150–400)
RBC: 3.78 MIL/uL — AB (ref 3.87–5.11)
RDW: 16.6 % — AB (ref 11.5–15.5)
WBC: 4.9 10*3/uL (ref 4.0–10.5)

## 2017-07-16 LAB — BASIC METABOLIC PANEL
ANION GAP: 9 (ref 5–15)
BUN: 18 mg/dL (ref 6–20)
CHLORIDE: 100 mmol/L — AB (ref 101–111)
CO2: 35 mmol/L — AB (ref 22–32)
Calcium: 6.7 mg/dL — ABNORMAL LOW (ref 8.9–10.3)
Creatinine, Ser: 0.89 mg/dL (ref 0.44–1.00)
GFR calc non Af Amer: >60 mL/min (ref >60)
Glucose, Bld: 84 mg/dL (ref 65–99)
POTASSIUM: 4.2 mmol/L (ref 3.5–5.1)
Sodium: 144 mmol/L (ref 135–145)

## 2017-07-16 LAB — VITAMIN D 25 HYDROXY (VIT D DEFICIENCY, FRACTURES): Vit D, 25-Hydroxy: 43.9 ng/mL (ref 30.0–100.0)

## 2017-07-16 MED ORDER — VITAMIN B-12 100 MCG PO TABS
100.0000 ug | ORAL_TABLET | Freq: Every day | ORAL | Status: DC
  Administered 2017-07-16 – 2017-07-17 (×2): 100 ug via ORAL
  Filled 2017-07-16 (×2): qty 1

## 2017-07-16 MED ORDER — FUROSEMIDE 10 MG/ML IJ SOLN
40.0000 mg | Freq: Once | INTRAMUSCULAR | Status: AC
  Administered 2017-07-16: 40 mg via INTRAVENOUS
  Filled 2017-07-16: qty 4

## 2017-07-16 NOTE — Progress Notes (Signed)
Progress Note  Patient Name: Jillian Eaton Date of Encounter: 07/16/2017  Primary Cardiologist: Dr. Pixie Casino  Subjective   Looks much better today. BP improved today. Remains in a-fib - rate controlled. On po amiodarone loading. Will require rehab stay. Diuresed 3L yesterday.   Inpatient Medications    Scheduled Meds: . amiodarone  400 mg Oral BID  . apixaban  5 mg Oral BID  . calcium carbonate  1,250 mg Oral TID WC  . carvedilol  3.125 mg Oral BID WC  . DULoxetine  60 mg Oral Daily  . ferrous sulfate  325 mg Oral TID WC  . levothyroxine  200 mcg Oral QAC breakfast  . mirabegron ER  50 mg Oral Daily  . mometasone-formoterol  2 puff Inhalation BID  . polyethylene glycol  17 g Oral BID  . pregabalin  200 mg Oral TID  . senna  1 tablet Oral Daily  . sodium chloride flush  3 mL Intravenous Q12H  . sodium chloride flush  3 mL Intravenous Q12H  . Vitamin D (Ergocalciferol)  50,000 Units Oral Q7 days   Continuous Infusions: . sodium chloride     PRN Meds: sodium chloride, acetaminophen **OR** acetaminophen, albuterol, bisacodyl, HYDROcodone-acetaminophen, lip balm, ondansetron **OR** ondansetron (ZOFRAN) IV, sodium chloride flush, zolpidem   Vital Signs    Vitals:   07/15/17 1739 07/15/17 2029 07/16/17 0017 07/16/17 0300  BP: 110/68 119/65 116/67   Pulse: (!) 113 86 96   Resp:  10 12   Temp:  98.6 F (37 C) 98.8 F (37.1 C)   TempSrc:  Oral Axillary   SpO2:  99% 98%   Weight:    (!) 344 lb 5.7 oz (156.2 kg)  Height:        Intake/Output Summary (Last 24 hours) at 07/16/2017 0830 Last data filed at 07/16/2017 0647 Gross per 24 hour  Intake 240 ml  Output 3400 ml  Net -3160 ml   Filed Weights   07/14/17 0503 07/15/17 0500 07/16/17 0300  Weight: (!) 343 lb 4.1 oz (155.7 kg) (!) 343 lb 4.1 oz (155.7 kg) (!) 344 lb 5.7 oz (156.2 kg)    Telemetry    Atrial fibrillation with CVR.  Personally reviewed.  ECG    N/A  Physical Exam   General  appearance: alert, no distress and morbidly obese Neck: no carotid bruit, no JVD and thyroid not enlarged, symmetric, no tenderness/mass/nodules Lungs: diminished breath sounds bilaterally Heart: irregularly irregular rhythm Abdomen: soft, non-tender; bowel sounds normal; no masses,  no organomegaly and morbidly obese Extremities: edema 1+ edema Pulses: 2+ and symmetric Skin: Skin color, texture, turgor normal. No rashes or lesions Neurologic: Grossly normal Psych: Pleasant.  Labs    Chemistry Recent Labs  Lab 07/10/17 2104 07/11/17 0219  07/14/17 0039 07/15/17 0239 07/16/17 0254  NA  --  144   < > 145 140 144  K  --  4.2   < > 4.2 4.3 4.2  CL  --  101   < > 101 99* 100*  CO2  --  31   < > 35* 34* 35*  GLUCOSE  --  78   < > 89 84 84  BUN  --  29*   < > 24* 20 18  CREATININE  --  1.31*   < > 1.04* 0.98 0.89  CALCIUM 5.5* 5.6*   < > 6.0* 6.2* 6.7*  PROT 6.4* 6.6  --  5.7*  --   --  ALBUMIN 3.0* 3.1*  --  2.6*  --   --   AST 15 16  --  16  --   --   ALT 10* 9*  --  8*  --   --   ALKPHOS 92 93  --  85  --   --   BILITOT 0.8 0.7  --  0.6  --   --   GFRNONAA  --  42*   < > 55* 59* >60  GFRAA  --  48*   < > >60 >60 >60  ANIONGAP  --  12   < > 9 7 9    < > = values in this interval not displayed.     Hematology Recent Labs  Lab 07/12/17 0737 07/14/17 0039 07/15/17 0239 07/16/17 0254  WBC 4.3  --  5.2 4.9  RBC 3.59*  --  3.76* 3.78*  HGB 8.9* 8.9* 9.3* 9.3*  HCT 31.0* 30.5* 32.3* 32.4*  MCV 86.4  --  85.9 85.7  MCH 24.8*  --  24.7* 24.6*  MCHC 28.7*  --  28.8* 28.7*  RDW 16.6*  --  16.6* 16.6*  PLT 156  --  181 189    Cardiac EnzymesNo results for input(s): TROPONINI in the last 168 hours.  Recent Labs  Lab 07/10/17 1706  TROPIPOC 0.00     BNP Recent Labs  Lab 07/10/17 1700  BNP 246.2*     Radiology    No results found.  Cardiac Studies   Echocardiogram 07/13/2017: Study Conclusions  - Procedure narrative: Transthoracic echocardiography.  Technically   difficult study with reduced echocardiographic windows due to   body habitus. - Left ventricle: Systolic function was normal. The estimated   ejection fraction was in the range of 60% to 65%. The study is   not technically sufficient to allow evaluation of LV diastolic   function. - Aortic valve: Trileaflet. Sclerosis without stenosis. There was   no regurgitation. - Mitral valve: Poorly visualized. Mildly thickened leaflets .   There was mild to moderate regurgitation. - Left atrium: Severely dilated. - Right ventricle: The cavity size was moderately dilated. Mildly   reduced systolic function. - Right atrium: Severely dilated. - Tricuspid valve: There was mild regurgitation. - Pulmonary arteries: PA peak pressure: 52 mm Hg (S). - Inferior vena cava: The vessel was dilated. The respirophasic   diameter changes were blunted (< 50%), consistent with elevated   central venous pressure.  Impressions:  - Compared to a prior study earlier this month, the LVEF is similar   at 60-65%. RVSP is higher at 52 mmHg. There is severe biatrial   enlargement.  Patient Profile     67 y.o. female with PMH of persistent atrial fibrillation on eliquis (new diagnosis 05/2017), chronic diastolic CHF, moderate pulmHTN, HTN, fibromyalgia, hypothyroidism, super morbid obesity, obesity hypoventilation syndrome with chronic hypoxic/hypercarbic respiratory failure, who presented with SOB. Cardiology following for acute on chronic diastolic CHF.  Assessment & Plan    1.  Persistent atrial fibrillation.  She is currently on Coreg 6.25 mg twice daily with reasonable heart rate control at rest, although heart rate does go up on ambulation. She is on Eliquis for stroke prophylaxis at this time. Given severe biatrial enlargement, she is unlikely to achieve or maintain sinus rhythm without antiarrhythmic therapy.    - On coreg to 3.125 mg BID  - On amiodarone 400 mg BID x 1 week, then reduce to 400  mg daily  - Plan outpatient cardioversion  in 3-4 weeks after consecutive anticoagulation on   Eliquis with no missed doses  2.  Chronic hypoxic/hypercarbic respiratory failure with obesity hypoventilation syndrome.  She requires supplemental oxygen, BiPAP at nighttime.  This significantly raises the likelihood of recurrent atrial arrhythmias.  -Appreciate pulmonary recs, plan to arrange home BIPAP   3.  Acute on chronic diastolic heart failure.  Most recent echocardiogram shows LVEF 60 to 65%.  -Remains volume overloaded (good diuresis over 3L yesterday) - resume lasix -   dose IV again today, transition back to 40 mg po BID possibly tomorrow - goal    weight may be around 340 lbs ??  5.  Hematuria.  She states that she had bleeding problems when on Xarelto originally.  Further work-up per primary team.  - Switched to Eliquis - hematuria improving  Ok to work toward SNF placement. We will see back in the office in 3-4 weeks to discuss cardioversion if she remains in a-fib.  Pixie Casino, MD, [facility], [place] Director of the [facility] &  Card[facility] Diplomate of the American Board of Clinical Lipidology Attending Cardiologist  Direct Dial: [phone]  Fax: [phone]  Website:  [email or web address]  Pixie Casino, MD  07/16/2017, 8:30 AM

## 2017-07-16 NOTE — Progress Notes (Signed)
PROGRESS NOTE    Jillian Eaton  MWN:[id number] DOB: Jul 18, 1950 DOA: 07/10/2017 PCP: Thornton Dales I, MD    Brief Narrative:  Jillian Eaton is a 67 y.o. female with medical history significant for BMI 54, asthma, chronic hypoxic and hypercarbic respiratory failure, chronic diastolic CHF, pulmonary hypertension, fibromyalgia, atrial fibrillation on Eliquis, and chronic anemia and thrombocytopenia, now presenting to the emergency department for evaluation of increased dyspnea and swelling.  Patient reports that the symptoms have been progressing over months, but worsened acutely over the past several days.  She has been in communication with her cardiologist regarding this and her Lasix was increased.  She reports some improvement with increased Lasix, but continues to have increased bilateral leg swelling and severe dyspnea with any exertion.  She denies fevers, chills, chest pain, or cough.  She denies wheezing.  She has not used her Symbicort or neb treatments at home in several months.  She follows with pulmonology at [facility] and is prescribed 3 L/min of supplemental oxygen around-the-clock.  ED Course: Upon arrival to the ED, patient is found to be afebrile, saturating 83% on room air, and with vitals otherwise stable.  EKG features atrial fibrillation with PVC and chest x-ray is notable for pulmonary vascular congestion without edema or consolidation.  Chemistry panel is notable for BUN of 32, creatinine 1.54, from 0.89 last month, and serum calcium of 5.5 with albumin level not available.  CBC is notable for stable normocytic anemia with hemoglobin of 9.7 and a stable thrombocytopenia with platelets 137,000.  Troponin is undetectable and BNP is elevated to 246.  ABG reveals pH 7.30, pCO2 69, and pO2 74.  Patient was given 1 g of IV calcium in the ED and started on BiPAP.  She remains hemodynamically stable and will be admitted to the stepdown unit for ongoing evaluation and management of acute  on chronic hypoxic and hypercarbic respiratory failure.     Assessment & Plan:   Principal Problem:   Hypercapnemia Active Problems:   Anemia of chronic disease   Pulmonary hypertension (HCC)   Acute on chronic respiratory failure with hypoxia and hypercapnia (HCC)   Atrial fibrillation (HCC)   Thrombocytopenia (HCC)   Asthma   Acute on chronic diastolic CHF (congestive heart failure) (HCC)   Hypothyroidism   Hypocalcemia   AKI (acute kidney injury) ([place])   1-Acute on chronic hypoxic and hypercarbic respiratory failure;  On chronic 3 L oxygen.  might be multifactorial; OHS, asthma, vs heart failure.  Continue with schedule nebulizer.  CCM consulted due to worsening hypercapnia and soft SBP and need for lasix. .  Pulmonary recommend Home BIPAP/  Needs BIPAP at HS.  CM to help arrange BIPAP/ for home Continue to use BIPAP/  Stable.   2-Acute on chronic diastolic CHF  Chest x ray with pulmonary vascular congestion, no frank pulmonary edema.  Cardiology consulted. Patient primary cardiologist has been adjusting lasix outpatient.  Received 40 mg IV on 5-23.  Appreciate Dr [name]  help/ patient received IV lasix 5-26. Good urine out put.   3- Acute kidney injury  Improved, Cr peak to 1.5 Cr decreased to 0.89  4-Hypocalcemia  Continue with oral calcium supplementation.  IV supplement. PTH was normal/  Mg normal  Started vitamin d supplemnt.  Vitamin D level 43. Continue with supplement.   Hypomagnesemia; mg 2 resolved.   5-Hypothyroidism On synthroid.   6-Fibromyalgia  - Continue Lyrica and Cymbalta  7-Atrial fibrillation  On xarelto, carvedilol.  Decrease carvedilol  to 3.5 Plan to start amiodarone   8-Hematuria;  Repeat UA tomorrow.  Would continue with eliquis, risk benefit discussed with patient.  Hb stable.   9-Anemia; iron deficiency and B 12 deficiency.  anemia panel.  ? Hematuria.  Last hb per records; Baseline hb 10----9 Started on oral iron.  B 12 supplement.    DVT prophylaxis: Xarelto  Code Status: full code.  Family Communication; no family at bedside.  Disposition Plan: step down unit. CCM evaluation    Consultants:   CCM   Procedures: none   Antimicrobials:   none   Subjective: Complaint with BIPAP.  She is breathing better.  Feels better    Objective: Vitals:   07/15/17 1739 07/15/17 2029 07/16/17 0017 07/16/17 0300  BP: 110/68 119/65 116/67   Pulse: (!) 113 86 96   Resp:  10 12   Temp:  98.6 F (37 C) 98.8 F (37.1 C)   TempSrc:  Oral Axillary   SpO2:  99% 98%   Weight:    (!) 156.2 kg (344 lb 5.7 oz)  Height:        Intake/Output Summary (Last 24 hours) at 07/16/2017 2025 Last data filed at 07/16/2017 4270 Gross per 24 hour  Intake 240 ml  Output 3400 ml  Net -3160 ml   Filed Weights   07/14/17 0503 07/15/17 0500 07/16/17 0300  Weight: (!) 155.7 kg (343 lb 4.1 oz) (!) 155.7 kg (343 lb 4.1 oz) (!) 156.2 kg (344 lb 5.7 oz)    Examination:  General exam: NAD Respiratory system: Normal respiratory effort, CTA Cardiovascular system:  S 1, S 2IRR Gastrointestinal system: BS present, soft, nt Central nervous system: Non focal.  Extremities; Symmetric power.  Skin: No rashes.     Data Reviewed: I have personally reviewed following labs and imaging studies  CBC: Recent Labs  Lab 07/10/17 1700 07/11/17 0219 07/12/17 0737 07/14/17 0039 07/15/17 0239 07/16/17 0254  WBC 5.2 5.3 4.3  --  5.2 4.9  NEUTROABS 3.6 3.1  --   --   --   --   HGB 9.7* 9.8* 8.9* 8.9* 9.3* 9.3*  HCT 32.8* 33.3* 31.0* 30.5* 32.3* 32.4*  MCV 85.2 85.2 86.4  --  85.9 85.7  PLT 137* 131* 156  --  181 623   Basic Metabolic Panel: Recent Labs  Lab 07/10/17 2104  07/12/17 0737 07/13/17 0314 07/14/17 0039 07/15/17 0239 07/16/17 0254  NA  --    < > 145 143 145 140 144  K  --    < > 4.3 4.2 4.2 4.3 4.2  CL  --    < > 100* 98* 101 99* 100*  CO2  --    < > 36* 36* 35* 34* 35*  GLUCOSE  --    < > 83 88 89  84 84  BUN  --    < > 23* 26* 24* 20 18  CREATININE  --    < > 1.06* 1.19* 1.04* 0.98 0.89  CALCIUM 5.5*   < > 5.9* 5.8* 6.0* 6.2* 6.7*  MG 1.2*  --   --  1.3* 1.6* 2.1  --   PHOS 7.4*  --   --   --   --   --   --    < > = values in this interval not displayed.   GFR: Estimated Creatinine Clearance: 98.9 mL/min (by C-G formula based on SCr of 0.89 mg/dL). Liver Function Tests: Recent Labs  Lab 07/10/17 2104 07/11/17  0998 07/14/17 0039  AST 15 16 16   ALT 10* 9* 8*  ALKPHOS 92 93 85  BILITOT 0.8 0.7 0.6  PROT 6.4* 6.6 5.7*  ALBUMIN 3.0* 3.1* 2.6*   No results for input(s): LIPASE, AMYLASE in the last 168 hours. No results for input(s): AMMONIA in the last 168 hours. Coagulation Profile: No results for input(s): INR, PROTIME in the last 168 hours. Cardiac Enzymes: No results for input(s): CKTOTAL, CKMB, CKMBINDEX, TROPONINI in the last 168 hours. BNP (last 3 results) Recent Labs    06/19/17 1222  PROBNP 2,308*   HbA1C: No results for input(s): HGBA1C in the last 72 hours. CBG: No results for input(s): GLUCAP in the last 168 hours. Lipid Profile: No results for input(s): CHOL, HDL, LDLCALC, TRIG, CHOLHDL, LDLDIRECT in the last 72 hours. Thyroid Function Tests: No results for input(s): TSH, T4TOTAL, FREET4, T3FREE, THYROIDAB in the last 72 hours. Anemia Panel: Recent Labs    07/15/17 0239  VITAMINB12 233  FERRITIN 15  TIBC 433  IRON 28   Sepsis Labs: Recent Labs  Lab 07/11/17 0928 07/11/17 1256  LATICACIDVEN 0.6 0.6    Recent Results (from the past 240 hour(s))  MRSA PCR Screening     Status: None   Collection Time: 07/11/17  5:59 PM  Result Value Ref Range Status   MRSA by PCR NEGATIVE NEGATIVE Final    Comment:        The GeneXpert MRSA Assay (FDA approved for NASAL specimens only), is one component of a comprehensive MRSA colonization surveillance program. It is not intended to diagnose MRSA infection nor to guide or monitor treatment for MRSA  infections. Performed at [facility], [place] [address] [place], [place] [address]          Radiology Studies: No results found.      Scheduled Meds: . amiodarone  400 mg Oral BID  . apixaban  5 mg Oral BID  . calcium carbonate  1,250 mg Oral TID WC  . carvedilol  3.125 mg Oral BID WC  . DULoxetine  60 mg Oral Daily  . ferrous sulfate  325 mg Oral TID WC  . levothyroxine  200 mcg Oral QAC breakfast  . mirabegron ER  50 mg Oral Daily  . mometasone-formoterol  2 puff Inhalation BID  . polyethylene glycol  17 g Oral BID  . pregabalin  200 mg Oral TID  . senna  1 tablet Oral Daily  . sodium chloride flush  3 mL Intravenous Q12H  . sodium chloride flush  3 mL Intravenous Q12H  . Vitamin D (Ergocalciferol)  50,000 Units Oral Q7 days   Continuous Infusions: . sodium chloride       LOS: 6 days    Time spent: 35 minutes.     Elmarie Shiley, MD [facility] Pager [phone]  If 7PM-7AM, please contact night-coverage [email or web address] Password [email or web address] 07/16/2017, 8:22 AM

## 2017-07-16 NOTE — Care Management Important Message (Signed)
Important Message  Patient Details  Name: Jillian Eaton MRN: [id number] Date of Birth: [birth year]   Medicare Important Message Given:  Yes    Delorse Lek 07/16/2017, 12:39 PM

## 2017-07-16 NOTE — Progress Notes (Signed)
RT in to check BIPAP, pt achieving volumes of over 1158mL on previous setting of 18/5. IPAP titrated to 12, and then readjusted to 14. Pt now achieving volumes of around 622mL on the current setting of 14/5, RR 12, 40%. RT will continue to monitor.

## 2017-07-16 NOTE — Plan of Care (Signed)
Pt up to commode and to chair and tolerated well.  Appetite good.  Denies pain.  Reported headache earlier today that resolved with PRN tylenol and has been pain free since.

## 2017-07-17 DIAGNOSIS — C73 Malignant neoplasm of thyroid gland: Secondary | ICD-10-CM | POA: Diagnosis not present

## 2017-07-17 DIAGNOSIS — I4891 Unspecified atrial fibrillation: Secondary | ICD-10-CM | POA: Diagnosis not present

## 2017-07-17 DIAGNOSIS — R0689 Other abnormalities of breathing: Secondary | ICD-10-CM | POA: Diagnosis not present

## 2017-07-17 DIAGNOSIS — I482 Chronic atrial fibrillation: Secondary | ICD-10-CM | POA: Diagnosis not present

## 2017-07-17 DIAGNOSIS — I5032 Chronic diastolic (congestive) heart failure: Secondary | ICD-10-CM | POA: Diagnosis not present

## 2017-07-17 DIAGNOSIS — E890 Postprocedural hypothyroidism: Secondary | ICD-10-CM | POA: Diagnosis not present

## 2017-07-17 DIAGNOSIS — I270 Primary pulmonary hypertension: Secondary | ICD-10-CM | POA: Diagnosis not present

## 2017-07-17 DIAGNOSIS — J9611 Chronic respiratory failure with hypoxia: Secondary | ICD-10-CM | POA: Diagnosis not present

## 2017-07-17 DIAGNOSIS — J9620 Acute and chronic respiratory failure, unspecified whether with hypoxia or hypercapnia: Secondary | ICD-10-CM | POA: Diagnosis not present

## 2017-07-17 DIAGNOSIS — J9621 Acute and chronic respiratory failure with hypoxia: Secondary | ICD-10-CM | POA: Diagnosis not present

## 2017-07-17 DIAGNOSIS — J45998 Other asthma: Secondary | ICD-10-CM | POA: Diagnosis not present

## 2017-07-17 DIAGNOSIS — J449 Chronic obstructive pulmonary disease, unspecified: Secondary | ICD-10-CM | POA: Diagnosis not present

## 2017-07-17 DIAGNOSIS — Z7401 Bed confinement status: Secondary | ICD-10-CM | POA: Diagnosis not present

## 2017-07-17 DIAGNOSIS — R5383 Other fatigue: Secondary | ICD-10-CM | POA: Diagnosis not present

## 2017-07-17 DIAGNOSIS — Z01818 Encounter for other preprocedural examination: Secondary | ICD-10-CM | POA: Diagnosis not present

## 2017-07-17 DIAGNOSIS — I5022 Chronic systolic (congestive) heart failure: Secondary | ICD-10-CM | POA: Diagnosis not present

## 2017-07-17 DIAGNOSIS — N179 Acute kidney failure, unspecified: Secondary | ICD-10-CM | POA: Diagnosis not present

## 2017-07-17 DIAGNOSIS — R319 Hematuria, unspecified: Secondary | ICD-10-CM | POA: Diagnosis not present

## 2017-07-17 DIAGNOSIS — K219 Gastro-esophageal reflux disease without esophagitis: Secondary | ICD-10-CM | POA: Diagnosis not present

## 2017-07-17 DIAGNOSIS — I10 Essential (primary) hypertension: Secondary | ICD-10-CM | POA: Diagnosis not present

## 2017-07-17 DIAGNOSIS — M797 Fibromyalgia: Secondary | ICD-10-CM | POA: Diagnosis not present

## 2017-07-17 DIAGNOSIS — Z6841 Body Mass Index (BMI) 40.0 and over, adult: Secondary | ICD-10-CM | POA: Diagnosis not present

## 2017-07-17 DIAGNOSIS — G8929 Other chronic pain: Secondary | ICD-10-CM | POA: Diagnosis not present

## 2017-07-17 DIAGNOSIS — G4700 Insomnia, unspecified: Secondary | ICD-10-CM | POA: Diagnosis not present

## 2017-07-17 DIAGNOSIS — R609 Edema, unspecified: Secondary | ICD-10-CM | POA: Diagnosis not present

## 2017-07-17 DIAGNOSIS — Z9884 Bariatric surgery status: Secondary | ICD-10-CM | POA: Diagnosis not present

## 2017-07-17 DIAGNOSIS — F339 Major depressive disorder, recurrent, unspecified: Secondary | ICD-10-CM | POA: Diagnosis not present

## 2017-07-17 DIAGNOSIS — I5031 Acute diastolic (congestive) heart failure: Secondary | ICD-10-CM | POA: Diagnosis not present

## 2017-07-17 DIAGNOSIS — R0602 Shortness of breath: Secondary | ICD-10-CM | POA: Diagnosis not present

## 2017-07-17 DIAGNOSIS — E039 Hypothyroidism, unspecified: Secondary | ICD-10-CM | POA: Diagnosis not present

## 2017-07-17 DIAGNOSIS — I5033 Acute on chronic diastolic (congestive) heart failure: Secondary | ICD-10-CM | POA: Diagnosis not present

## 2017-07-17 DIAGNOSIS — D649 Anemia, unspecified: Secondary | ICD-10-CM | POA: Diagnosis not present

## 2017-07-17 DIAGNOSIS — D693 Immune thrombocytopenic purpura: Secondary | ICD-10-CM | POA: Diagnosis not present

## 2017-07-17 DIAGNOSIS — I110 Hypertensive heart disease with heart failure: Secondary | ICD-10-CM | POA: Diagnosis not present

## 2017-07-17 DIAGNOSIS — Z9981 Dependence on supplemental oxygen: Secondary | ICD-10-CM | POA: Diagnosis not present

## 2017-07-17 DIAGNOSIS — I509 Heart failure, unspecified: Secondary | ICD-10-CM | POA: Diagnosis not present

## 2017-07-17 DIAGNOSIS — M2550 Pain in unspecified joint: Secondary | ICD-10-CM | POA: Diagnosis not present

## 2017-07-17 DIAGNOSIS — M1990 Unspecified osteoarthritis, unspecified site: Secondary | ICD-10-CM | POA: Diagnosis not present

## 2017-07-17 DIAGNOSIS — G4733 Obstructive sleep apnea (adult) (pediatric): Secondary | ICD-10-CM | POA: Diagnosis not present

## 2017-07-17 DIAGNOSIS — M6281 Muscle weakness (generalized): Secondary | ICD-10-CM | POA: Diagnosis not present

## 2017-07-17 DIAGNOSIS — G4730 Sleep apnea, unspecified: Secondary | ICD-10-CM | POA: Diagnosis not present

## 2017-07-17 DIAGNOSIS — I480 Paroxysmal atrial fibrillation: Secondary | ICD-10-CM | POA: Diagnosis not present

## 2017-07-17 DIAGNOSIS — J45909 Unspecified asthma, uncomplicated: Secondary | ICD-10-CM | POA: Diagnosis not present

## 2017-07-17 DIAGNOSIS — I272 Pulmonary hypertension, unspecified: Secondary | ICD-10-CM | POA: Diagnosis not present

## 2017-07-17 LAB — CBC
HEMATOCRIT: 31.7 % — AB (ref 36.0–46.0)
Hemoglobin: 9.1 g/dL — ABNORMAL LOW (ref 12.0–15.0)
MCH: 24.7 pg — ABNORMAL LOW (ref 26.0–34.0)
MCHC: 28.7 g/dL — AB (ref 30.0–36.0)
MCV: 85.9 fL (ref 78.0–100.0)
Platelets: 202 10*3/uL (ref 150–400)
RBC: 3.69 MIL/uL — ABNORMAL LOW (ref 3.87–5.11)
RDW: 16.6 % — AB (ref 11.5–15.5)
WBC: 5.2 10*3/uL (ref 4.0–10.5)

## 2017-07-17 LAB — BASIC METABOLIC PANEL
Anion gap: 10 (ref 5–15)
BUN: 22 mg/dL — ABNORMAL HIGH (ref 6–20)
CALCIUM: 6.9 mg/dL — AB (ref 8.9–10.3)
CO2: 36 mmol/L — AB (ref 22–32)
CREATININE: 1.12 mg/dL — AB (ref 0.44–1.00)
Chloride: 98 mmol/L — ABNORMAL LOW (ref 101–111)
GFR calc non Af Amer: 50 mL/min — ABNORMAL LOW (ref >60)
GFR, EST AFRICAN AMERICAN: 58 mL/min — AB (ref >60)
Glucose, Bld: 95 mg/dL (ref 65–99)
Potassium: 4.6 mmol/L (ref 3.5–5.1)
Sodium: 144 mmol/L (ref 135–145)

## 2017-07-17 MED ORDER — CALCIUM CARBONATE 1250 (500 CA) MG PO TABS: 1250.0000 mg | ORAL_TABLET | Freq: Three times a day (TID) | ORAL | 0 refills | Status: DC

## 2017-07-17 MED ORDER — SENNA 8.6 MG PO TABS: 1.0000 | ORAL_TABLET | Freq: Every day | ORAL | 0 refills | Status: DC

## 2017-07-17 MED ORDER — FUROSEMIDE 40 MG PO TABS
40.0000 mg | ORAL_TABLET | Freq: Two times a day (BID) | ORAL | Status: DC
  Administered 2017-07-17: 40 mg via ORAL
  Filled 2017-07-17: qty 1

## 2017-07-17 MED ORDER — CYANOCOBALAMIN 100 MCG PO TABS: 100.0000 ug | ORAL_TABLET | Freq: Every day | ORAL | 0 refills | Status: DC

## 2017-07-17 MED ORDER — FERROUS SULFATE 325 (65 FE) MG PO TABS: 325.0000 mg | ORAL_TABLET | Freq: Three times a day (TID) | ORAL | 3 refills | Status: DC

## 2017-07-17 MED ORDER — POLYETHYLENE GLYCOL 3350 17 G PO PACK: 17.0000 g | PACK | Freq: Two times a day (BID) | ORAL | 0 refills | Status: DC

## 2017-07-17 MED ORDER — DULOXETINE HCL 60 MG PO CPEP: 60.0000 mg | ORAL_CAPSULE | Freq: Every day | ORAL | 0 refills | Status: DC

## 2017-07-17 MED ORDER — CARVEDILOL 3.125 MG PO TABS: 3.1250 mg | ORAL_TABLET | Freq: Two times a day (BID) | ORAL | 0 refills | Status: DC

## 2017-07-17 MED ORDER — AMIODARONE HCL 400 MG PO TABS: ORAL_TABLET | ORAL | 0 refills | Status: DC

## 2017-07-17 NOTE — Telephone Encounter (Signed)
New Message    Patient is admitted to hospital and needs a 2 week follow up - only wants to see Dr [name]

## 2017-07-17 NOTE — Progress Notes (Signed)
PT Cancellation Note  Patient Details Name: Jillian Eaton MRN: [id number] DOB: [birth year]   Cancelled Treatment:    Reason Eval/Treat Not Completed: Other (comment).  Pt was looking at her phone and asked PT to let her rest.  Will come back at another time.     Ramond Dial 07/17/2017, 9:50 AM   Mee Hives, PT MS Acute Rehab Dept. Number: [place] and [place]

## 2017-07-17 NOTE — Clinical Social Work Placement (Signed)
Nurse to call report to [phone], Room 123B    CLINICAL SOCIAL WORK PLACEMENT  NOTE  Date:  07/17/2017  Patient Details  Name: Jillian Eaton MRN: [id number] Date of Birth: 12/01/1950  Clinical Social Work is seeking post-discharge placement for this patient at the [place] level of care (*CSW will initial, date and re-position this form in  chart as items are completed):  Yes   Patient/family provided with [place] Work Department's list of facilities offering this level of care within the geographic area requested by the patient (or if unable, by the patient's family).  Yes   Patient/family informed of their freedom to choose among providers that offer the needed level of care, that participate in Medicare, Medicaid or managed care program needed by the patient, have an available bed and are willing to accept the patient.  Yes   Patient/family informed of [facility]'s ownership interest in [facility] and [facility], as well as of the fact that they are under no obligation to receive care at these facilities.  PASRR submitted to EDS on       PASRR number received on       Existing PASRR number confirmed on 07/13/17     FL2 transmitted to all facilities in geographic area requested by pt/family on 07/13/17     FL2 transmitted to all facilities within larger geographic area on       Patient informed that his/her managed care company has contracts with or will negotiate with certain facilities, including the following:        Yes   Patient/family informed of bed offers received.  Patient chooses bed at [facility]     Physician recommends and patient chooses bed at      Patient to be transferred to [facility] on 07/17/17.  Patient to be transferred to facility by [facility]     Patient family notified on 07/17/17 of transfer.  Name of family member notified:        PHYSICIAN       Additional Comment:     _______________________________________________ Geralynn Ochs, LCSW 07/17/2017, 12:28 PM

## 2017-07-17 NOTE — Discharge Summary (Signed)
Physician Discharge Summary  Jillian Eaton [facility] NIO:[id number] DOB: [birth year] DOA: 07/10/2017  PCP: Thornton Dales I, MD  Admit date: 07/10/2017 Discharge date: 07/17/2017  Admitted From: Home  Disposition: SNF  Recommendations for Outpatient Follow-up:  1. Follow up with PCP in 1-2 weeks 2. Please obtain BMP/CBC in one week 3. Needs to use BIPAP at HS.  4. Needs to follow up with Dr [name] for cardioversion and Heart failure management.  5. Follow calcium level.  6. Needs amiodarone 400 mg BID for 1 week, then 400 mg daily.    Discharge Condition: stable.  CODE STATUS: full code.  Diet recommendation: Heart Healthy   Brief/Interim Summary: Brief Narrative: Jillian Eaton a 67 y.o.femalewith medical history significant forBMI 54, asthma, chronic hypoxic and hypercarbic respiratory failure, chronic diastolic CHF, pulmonary hypertension, fibromyalgia, atrial fibrillation on Eliquis, and chronic anemia and thrombocytopenia, now presenting to the emergency department for evaluation of increased dyspnea and swelling. Patient reports that the symptoms have been progressing over months, but worsened acutely over the past several days. She has been in communication with her cardiologist regarding this and her Lasix was increased. She reports some improvement with increased Lasix, but continues to have increased bilateral leg swelling and severe dyspnea with any exertion. She denies fevers, chills, chest pain, or cough. She denies wheezing. She has not used her Symbicort or neb treatments at home in several months. She follows with pulmonology at [facility] is prescribed 3 L/min of supplemental oxygen around-the-clock.  ED Course:Upon arrival to the ED, patient is found to be afebrile, saturating 83% on room air, and with vitals otherwise stable. EKG features atrial fibrillation with PVC and chest x-ray is notable for pulmonary vascular congestion without edema or consolidation.  Chemistry panel is notable for BUN of 32, creatinine 1.54, from 0.89 last month, and serum calcium of 5.5 with albumin level not available. CBC is notable for stable normocytic anemia with hemoglobin of 9.7 and a stable thrombocytopenia with platelets 137,000. Troponin is undetectable and BNP is elevated to 246. ABG reveals pH 7.30, pCO2 69, andpO2 74. Patient was given 1 g of IV calcium in the ED and started on BiPAP. She remains hemodynamicallystable and will be admitted to the stepdown unit for ongoing evaluation and management of acute on chronic hypoxic and hypercarbic respiratory failure.     Assessment & Plan:   Principal Problem:   Hypercapnemia Active Problems:   Anemia of chronic disease   Pulmonary hypertension (HCC)   Acute on chronic respiratory failure with hypoxia and hypercapnia (HCC)   Atrial fibrillation (HCC)   Thrombocytopenia (HCC)   Asthma   Acute on chronic diastolic CHF (congestive heart failure) (HCC)   Hypothyroidism   Hypocalcemia   AKI (acute kidney injury) ([place])   1-Acute on chronic hypoxic and hypercarbic respiratory failure;  On chronic 3 L oxygen.  might be multifactorial; OHS, asthma, vs heart failure.  Continue with schedule nebulizer.  CCM consulted due to worsening hypercapnia and soft SBP and need for lasix. .  Pulmonary recommend Home BIPAP/  Needs BIPAP at HS.  Continue to use BIPAP/  She will need BIPAP at SNF and subsequently at home.   2-Acute on chronic diastolic CHF Chest x ray with pulmonary vascular congestion, no frank pulmonary edema.  Cardiology consulted. Patient primary cardiologist has been adjusting lasix outpatient.  Received 40 mg IV on 5-23.  Appreciate Dr [name]  help/ patient received IV lasix 5-26 and 5-27. Good urine out put.  Plan to discharge  on 40 mg BID>    3-Acute kidney injury Improved, Cr peak to 1.5 Cr at 1.1 monitor out patient on lasix.   4-Hypocalcemia Continue with oral calcium  supplementation.  Treated with IV supplement. PTH was normal/  Mg normal  Started vitamin d supplemnt.  Vitamin D level 43. Continue with supplement.  improved calcium at 6.9, corrected calcium for albumin at 8.   Hypomagnesemia; mg 2 resolved.  Discharge on oral supplement.   5-Hypothyroidism On synthroid.   6-Fibromyalgia -Continue Lyrica and Cymbalta  7-Atrial fibrillation  On xarelto, carvedilol.  Decrease carvedilol to 3.5 Patient was started on Amiodarone 400 mg BID then 400 mg daily.  Follow up cardiology in 3-4 weeks for cardioversion.   8-Hematuria;  Would continue with eliquis, risk benefit discussed with patient.  Hb stable.  she has had evaluation for this by urology, CT scan and was treated for UTI also.   9-Anemia; iron deficiency and B 12 deficiency.  anemia panel.  ? Hematuria.  Last hb per records; Baseline hb 10----9 Started on oral iron. B 12 supplement.  hb stable.     Discharge Diagnoses:  Principal Problem:   Hypercapnemia Active Problems:   Anemia of chronic disease   Pulmonary hypertension (HCC)   Acute on chronic respiratory failure with hypoxia and hypercapnia (HCC)   Atrial fibrillation (HCC)   Thrombocytopenia (HCC)   Asthma   Acute on chronic diastolic CHF (congestive heart failure) (HCC)   Hypothyroidism   Hypocalcemia   AKI (acute kidney injury) [facility])    Discharge Instructions  Discharge Instructions    Diet - low sodium heart healthy   Complete by:  As directed    Increase activity slowly   Complete by:  As directed      Allergies as of 07/17/2017      Reactions   Norco [hydrocodone-acetaminophen] Itching      Medication List    STOP taking these medications   calcium carbonate 500 MG chewable tablet Commonly known as:  TUMS - dosed in mg elemental calcium   cyclobenzaprine 10 MG tablet Commonly known as:  FLEXERIL   meloxicam 15 MG tablet Commonly known as:  MOBIC   traZODone 50 MG  tablet Commonly known as:  DESYREL     TAKE these medications   acetaminophen 650 MG CR tablet Commonly known as:  TYLENOL Take 650 mg by mouth every 8 (eight) hours as needed for pain.   amiodarone 400 MG tablet Commonly known as:  PACERONE Take 400 mg BID for 1 week then 400 mg daily   apixaban 5 MG Tabs tablet Commonly known as:  ELIQUIS Take 1 tablet (5 mg total) by mouth 2 (two) times daily.   budesonide-formoterol 160-4.5 MCG/ACT inhaler Commonly known as:  SYMBICORT Inhale 2 puffs into the lungs 2 (two) times daily as needed (shortness of breath.).   calcium carbonate 1250 (500 Ca) MG tablet Commonly known as:  OS-CAL - dosed in mg of elemental calcium Take 1 tablet (1,250 mg total) by mouth 3 (three) times daily with meals.   carvedilol 3.125 MG tablet Commonly known as:  COREG Take 1 tablet (3.125 mg total) by mouth 2 (two) times daily with a meal. What changed:    medication strength  how much to take  when to take this   cyanocobalamin 100 MCG tablet Take 1 tablet (100 mcg total) by mouth daily.   DULoxetine 60 MG capsule Commonly known as:  CYMBALTA Take 1 capsule (60 mg total)  by mouth daily.   eszopiclone 2 MG Tabs tablet Commonly known as:  LUNESTA Take 2 mg by mouth at bedtime.   ferrous sulfate 325 (65 FE) MG tablet Take 1 tablet (325 mg total) by mouth 3 (three) times daily with meals.   furosemide 20 MG tablet Commonly known as:  LASIX Take 2 tablets (40 mg total) by mouth 2 (two) times daily.   levothyroxine 200 MCG tablet Commonly known as:  SYNTHROID, LEVOTHROID Take 1 tablet (200 mcg total) by mouth daily before breakfast.   LYRICA 200 MG capsule Generic drug:  pregabalin Take 200 mg by mouth 2 (two) times daily.   magnesium oxide 400 MG tablet Commonly known as:  MAG-OX Take 400 mg by mouth 2 (two) times daily.   MYRBETRIQ 50 MG Tb24 tablet Generic drug:  mirabegron ER Take 50 mg by mouth daily.   OXYGEN Inhale 2 L/min  into the lungs continuous.   polyethylene glycol packet Commonly known as:  MIRALAX / GLYCOLAX Take 17 g by mouth 2 (two) times daily.   senna 8.6 MG Tabs tablet Commonly known as:  SENOKOT Take 1 tablet (8.6 mg total) by mouth daily.   Vitamin D (Ergocalciferol) 50000 units Caps capsule Commonly known as:  DRISDOL Take 50,000 Units by mouth every 7 (seven) days.       Allergies  Allergen Reactions  . Norco [Hydrocodone-Acetaminophen] Itching    Consultations:  Cardiology  CCM    Procedures/Studies: Dg Chest 2 View  Result Date: 07/10/2017 CLINICAL DATA:  Shortness of breath EXAM: CHEST - 2 VIEW COMPARISON:  April 19, 2017 FINDINGS: There is cardiomegaly with pulmonary venous hypertension. There is bibasilar atelectasis. There is no frank edema or consolidation. No adenopathy. There is degenerative change in the thoracic spine. IMPRESSION: Pulmonary vascular congestion without edema or consolidation. Electronically Signed   By: Lowella Grip III M.D.   On: 07/10/2017 17:44   Dg Chest Port 1 View  Result Date: 07/12/2017 CLINICAL DATA:  Respiratory failure, short of breath EXAM: PORTABLE CHEST 1 VIEW COMPARISON:  Portable chest x-ray 07/10/2017 FINDINGS: Aeration of the lungs may have improved slightly. There is still moderate cardiomegaly present with mild pulmonary vascular congestion. No definite pleural effusion is seen. IMPRESSION: Slightly improved aeration. Stable cardiomegaly with pulmonary vascular congestion Electronically Signed   By: Ivar Drape M.D.   On: 07/12/2017 09:32     Subjective: She is breathing better. Denies chest pain   Discharge Exam: Vitals:   07/17/17 0427 07/17/17 0826  BP: (!) 103/57 94/65  Pulse: (!) 59 77  Resp: 16 17  Temp: 98 F (36.7 C) 98.6 F (37 C)  SpO2: 100% 92%   Vitals:   07/17/17 0028 07/17/17 0427 07/17/17 0500 07/17/17 0826  BP: (!) 104/54 (!) 103/57  94/65  Pulse: 72 (!) 59  77  Resp:  16  17  Temp: 98.1 F  (36.7 C) 98 F (36.7 C)  98.6 F (37 C)  TempSrc: Oral Oral  Oral  SpO2: 98% 100%  92%  Weight:   (!) 153 kg (337 lb 4.9 oz)   Height:        General: Pt is alert, awake, not in acute distress Cardiovascular: RRR, S1/S2 +, no rubs, no gallops Respiratory: CTA bilaterally, no wheezing, no rhonchi Abdominal: Soft, NT, ND, bowel sounds + Extremities: no edema, no cyanosis    The results of significant diagnostics from this hospitalization (including imaging, microbiology, ancillary and laboratory) are listed below for reference.  Microbiology: Recent Results (from the past 240 hour(s))  MRSA PCR Screening     Status: None   Collection Time: 07/11/17  5:59 PM  Result Value Ref Range Status   MRSA by PCR NEGATIVE NEGATIVE Final    Comment:        The GeneXpert MRSA Assay (FDA approved for NASAL specimens only), is one component of a comprehensive MRSA colonization surveillance program. It is not intended to diagnose MRSA infection nor to guide or monitor treatment for MRSA infections. Performed at [facility], [place] [address] [place], [place] [address]      Labs: BNP (last 3 results) Recent Labs    08/17/16 2355 08/28/16 2257 07/10/17 1700  BNP 28.9 29.5 938.1*   Basic Metabolic Panel: Recent Labs  Lab 07/10/17 2104  07/13/17 0314 07/14/17 0039 07/15/17 0239 07/16/17 0254 07/17/17 0252  NA  --    < > 143 145 140 144 144  K  --    < > 4.2 4.2 4.3 4.2 4.6  CL  --    < > 98* 101 99* 100* 98*  CO2  --    < > 36* 35* 34* 35* 36*  GLUCOSE  --    < > 88 89 84 84 95  BUN  --    < > 26* 24* 20 18 22*  CREATININE  --    < > 1.19* 1.04* 0.98 0.89 1.12*  CALCIUM 5.5*   < > 5.8* 6.0* 6.2* 6.7* 6.9*  MG 1.2*  --  1.3* 1.6* 2.1  --   --   PHOS 7.4*  --   --   --   --   --   --    < > = values in this interval not displayed.   Liver Function Tests: Recent Labs  Lab 07/10/17 2104 07/11/17 0219 07/14/17 0039  AST 15 16 16   ALT 10* 9* 8*  ALKPHOS 92 93  85  BILITOT 0.8 0.7 0.6  PROT 6.4* 6.6 5.7*  ALBUMIN 3.0* 3.1* 2.6*   No results for input(s): LIPASE, AMYLASE in the last 168 hours. No results for input(s): AMMONIA in the last 168 hours. CBC: Recent Labs  Lab 07/10/17 1700 07/11/17 0219 07/12/17 0737 07/14/17 0039 07/15/17 0239 07/16/17 0254 07/17/17 0252  WBC 5.2 5.3 4.3  --  5.2 4.9 5.2  NEUTROABS 3.6 3.1  --   --   --   --   --   HGB 9.7* 9.8* 8.9* 8.9* 9.3* 9.3* 9.1*  HCT 32.8* 33.3* 31.0* 30.5* 32.3* 32.4* 31.7*  MCV 85.2 85.2 86.4  --  85.9 85.7 85.9  PLT 137* 131* 156  --  181 189 202   Cardiac Enzymes: No results for input(s): CKTOTAL, CKMB, CKMBINDEX, TROPONINI in the last 168 hours. BNP: Invalid input(s): POCBNP CBG: No results for input(s): GLUCAP in the last 168 hours. D-Dimer No results for input(s): DDIMER in the last 72 hours. Hgb A1c No results for input(s): HGBA1C in the last 72 hours. Lipid Profile No results for input(s): CHOL, HDL, LDLCALC, TRIG, CHOLHDL, LDLDIRECT in the last 72 hours. Thyroid function studies No results for input(s): TSH, T4TOTAL, T3FREE, THYROIDAB in the last 72 hours.  Invalid input(s): FREET3 Anemia work up Recent Labs    07/15/17 0239  VITAMINB12 233  FERRITIN 15  TIBC 433  IRON 28   Urinalysis    Component Value Date/Time   COLORURINE AMBER (A) 07/14/2017 0413   APPEARANCEUR CLOUDY (A) 07/14/2017  0413   LABSPEC 1.021 07/14/2017 0413   PHURINE 5.0 07/14/2017 0413   GLUCOSEU NEGATIVE 07/14/2017 0413   HGBUR LARGE (A) 07/14/2017 0413   BILIRUBINUR NEGATIVE 07/14/2017 0413   KETONESUR NEGATIVE 07/14/2017 0413   PROTEINUR 100 (A) 07/14/2017 0413   UROBILINOGEN 1.0 06/06/2013 2154   NITRITE NEGATIVE 07/14/2017 0413   LEUKOCYTESUR SMALL (A) 07/14/2017 0413   Sepsis Labs Invalid input(s): PROCALCITONIN,  WBC,  LACTICIDVEN Microbiology Recent Results (from the past 240 hour(s))  MRSA PCR Screening     Status: None   Collection Time: 07/11/17  5:59 PM  Result  Value Ref Range Status   MRSA by PCR NEGATIVE NEGATIVE Final    Comment:        The GeneXpert MRSA Assay (FDA approved for NASAL specimens only), is one component of a comprehensive MRSA colonization surveillance program. It is not intended to diagnose MRSA infection nor to guide or monitor treatment for MRSA infections. Performed at [facility], [place] [address] [place], [place] [address]      Time coordinating discharge: 35 minutes   SIGNED:   Elmarie Shiley, MD  [facility] 07/17/2017, 8:58 AM Pager [phone]  If 7PM-7AM, please contact night-coverage [email or web address] Password [email or web address]

## 2017-07-17 NOTE — Telephone Encounter (Signed)
6/6 at 9:30 am with Dr. [name]

## 2017-07-19 DIAGNOSIS — M797 Fibromyalgia: Secondary | ICD-10-CM | POA: Diagnosis not present

## 2017-07-19 DIAGNOSIS — I5022 Chronic systolic (congestive) heart failure: Secondary | ICD-10-CM | POA: Diagnosis not present

## 2017-07-19 DIAGNOSIS — G4730 Sleep apnea, unspecified: Secondary | ICD-10-CM | POA: Diagnosis not present

## 2017-07-19 DIAGNOSIS — R609 Edema, unspecified: Secondary | ICD-10-CM | POA: Diagnosis not present

## 2017-07-19 DIAGNOSIS — M6281 Muscle weakness (generalized): Secondary | ICD-10-CM | POA: Diagnosis not present

## 2017-07-19 DIAGNOSIS — M1990 Unspecified osteoarthritis, unspecified site: Secondary | ICD-10-CM | POA: Diagnosis not present

## 2017-07-20 ENCOUNTER — Ambulatory Visit: Payer: Medicare Other | Admitting: Internal Medicine

## 2017-07-20 DIAGNOSIS — I480 Paroxysmal atrial fibrillation: Secondary | ICD-10-CM | POA: Diagnosis not present

## 2017-07-20 DIAGNOSIS — I5033 Acute on chronic diastolic (congestive) heart failure: Secondary | ICD-10-CM | POA: Diagnosis not present

## 2017-07-20 DIAGNOSIS — J9621 Acute and chronic respiratory failure with hypoxia: Secondary | ICD-10-CM | POA: Diagnosis not present

## 2017-07-23 DIAGNOSIS — M797 Fibromyalgia: Secondary | ICD-10-CM | POA: Diagnosis not present

## 2017-07-23 DIAGNOSIS — I5022 Chronic systolic (congestive) heart failure: Secondary | ICD-10-CM | POA: Diagnosis not present

## 2017-07-23 DIAGNOSIS — R609 Edema, unspecified: Secondary | ICD-10-CM | POA: Diagnosis not present

## 2017-07-24 DIAGNOSIS — G8929 Other chronic pain: Secondary | ICD-10-CM | POA: Diagnosis not present

## 2017-07-24 DIAGNOSIS — I5022 Chronic systolic (congestive) heart failure: Secondary | ICD-10-CM | POA: Diagnosis not present

## 2017-07-24 DIAGNOSIS — R609 Edema, unspecified: Secondary | ICD-10-CM | POA: Diagnosis not present

## 2017-07-24 DIAGNOSIS — M797 Fibromyalgia: Secondary | ICD-10-CM | POA: Diagnosis not present

## 2017-07-25 DIAGNOSIS — R319 Hematuria, unspecified: Secondary | ICD-10-CM | POA: Diagnosis not present

## 2017-07-25 DIAGNOSIS — I5022 Chronic systolic (congestive) heart failure: Secondary | ICD-10-CM | POA: Diagnosis not present

## 2017-07-25 DIAGNOSIS — M797 Fibromyalgia: Secondary | ICD-10-CM | POA: Diagnosis not present

## 2017-07-25 DIAGNOSIS — I482 Chronic atrial fibrillation: Secondary | ICD-10-CM | POA: Diagnosis not present

## 2017-07-26 ENCOUNTER — Ambulatory Visit (INDEPENDENT_AMBULATORY_CARE_PROVIDER_SITE_OTHER): Payer: Medicare Other | Admitting: Internal Medicine

## 2017-07-26 ENCOUNTER — Encounter: Payer: Self-pay | Admitting: Internal Medicine

## 2017-07-26 VITALS — BP 99/60 | HR 75 | Ht 67.0 in | Wt 340.0 lb

## 2017-07-26 DIAGNOSIS — I272 Pulmonary hypertension, unspecified: Secondary | ICD-10-CM

## 2017-07-26 DIAGNOSIS — Z01818 Encounter for other preprocedural examination: Secondary | ICD-10-CM | POA: Diagnosis not present

## 2017-07-26 DIAGNOSIS — I4891 Unspecified atrial fibrillation: Secondary | ICD-10-CM

## 2017-07-26 DIAGNOSIS — I5031 Acute diastolic (congestive) heart failure: Secondary | ICD-10-CM | POA: Diagnosis not present

## 2017-07-26 MED ORDER — AMIODARONE HCL 400 MG PO TABS: ORAL_TABLET | ORAL | 0 refills | Status: DC

## 2017-07-26 MED ORDER — FUROSEMIDE 40 MG PO TABS: 40.0000 mg | ORAL_TABLET | Freq: Every day | ORAL | Status: DC

## 2017-07-26 NOTE — Progress Notes (Signed)
OFFICE NOTE  Chief Complaint:  Hospital follow-up transition of care  Primary Care Physician: [name], MD  HPI:  Jillian Eaton is a 67 year old female I saw a few weeks ago with a history of super morbid obesity, fibromyalgia and increasing shortness of breath. She had a cath in 2012 which showed normal coronaries, mildly elevating filling pressures, and diastolic pressures with a mean PA of 31. This correlated with her echocardiogram when RV systolic pressures in 1941 were 49 on echo. A repeat echocardiogram was just performed which demonstrated a preserved LVEF of 60-65% with moderate concentric LVH. There was mild to moderate increase in pulmonary artery pressure with peak at 51, which has not significantly changed from her study 1 year ago. Although the pressures look very similar her shortness of breath has been increasing significantly, and I recommended that she wear oxygen at night since she had that at home which does seem to be helping her at least feel better during the day as I suspect she has sleep apnea. She has been tested before which was apparently negative and is considering retesting at some point in the future. Overall I think the main issue obviously is weight, and she unfortunately says that she is not a candidate for gastric bypass and therefore it is a very difficult situation.  I referred her back to her pulmonologist in [facility] for ongoing evaluation of pulmonary hypertension. She tells me that she was then referred to [facility] and saw a specialist there who did another right heart catheterization but did not recommend any medications other than better blood pressure control.   In addition she had problems with kidney stones and underwent 2 operations regarding tthis.  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  She is now dependent on thyroid medication. Unfortunately she's not been able to lose weight, but her  weight is fairly stable around 400 pounds as is her shortness of breath.  Cath in 2012:  LEFT HEART CATHETERIZATION  OPERATOR: Mali Hilty, MD, and Rolland Porter, MD  INDICATION: Dyspnea on exertion and chest pressure.  HISTORY OF PRESENT ILLNESS: Jillian Eaton is a morbidly obese (BMI greater than 59) female with a history of failed gastric bypass x2 with an increasing weight gain and increasing shortness of breath as well as new- onset dyspnea on exertion. She reports that she can only walk about 10 feet before she becomes short of breath and has chest pressure which she says get better with rest. She has numerous cardiac risk factors and given the high likelihood of false positive stress test, I have referred her for cardiac catheterization, both left and right heart as she has had elevated RVSP on echocardiogram of approximately 30 to 31 mmHg.  PROCEDURE: The patient was brought into the cardiac catheterization lab, sterilely prepped and draped in the usual fashion. The area around the right femoral artery and right brachial vein were cleansed and draped to allow an attempt at radial arterial and brachiocephalic venous access. IV was not able to be obtained prior to the procedure given her body habitus. With difficulty in assessing the vein, the ultrasound was eventually used to identify the right brachiocephalic vein, however, cannulation with needle and wire was not possible as the vein was very small in caliber. After mild local bleeding was controlled, we did turn our attention to the right femoral vein and with great difficulty using the ultrasound, the right femoral vein was  accessed by Dr. Ellyn Hack using a straight wire and a needle. After venous access was obtained, the attention was turned to the right radial artery by Dr. Ellyn Hack and simultaneous to that right heart catheterization was performed by myself without any difficulty. The right radial artery  was successfully cannulated and subsequently left coronary artery system was selectively injected with a 5-French TIG 4.0 catheter, however the right coronary artery could not be cannulated with the TIG catheter and was eventually cannulated with a JR-4 catheter. LV pressure was measured with a pigtail catheter. Estimated blood loss was about 30 mL. There were no acute complications. The patient received a total of 9 mg of Versed throughout the procedure as well as 225 mcg of fentanyl and was at no point greater than moderately sedated. She received 5000 units of heparin about 10 mL of a radial cocktail.  FINDINGS: 1. Left main - short, no disease. 2. LAD - no significant disease. 3. Left circumflex, no significant disease. 4. RCA - dominant, no disease, large-caliber vessel. 5. LVEDP = 20 mmHg. 6. RA - 12. 7. RV 38/12. 8. PA - 43/19 (31). 9. PCWP - 24. 10.TPG - 7. 11.Fick cardiac output/Fick cardiac index - 10.56/3.84. 12.Thermodilution cardiac output/thermodilution cardiac index -  6.78/2.47. 13.Aortic saturation - 94%. 14.PA saturation - 68%.  IMPRESSION: 1. No significant obstructive coronary artery disease. 2. LVEDP = 20 mmHg. 3. Borderline pulmonary venous hypertension. 4. High cardiac output.  [name] returns today for follow-up. She reports that her shortness of breath has not significantly worsened, in fact, possibly is slightly better. She did have thyroid surgery last year at [facility] and apparently underwent right heart catheterization prior to that. I do not have those records immediately available. Unfortunately she continues to maintain her weight and has not been able to lose any. She is complaining of some numbness and tingling in her feet which is likely neuropathy. She is on medication including Lyrica which she takes for fibromyalgia.  Jillian Eaton returns today for follow-up. She recently is been having more shortness of  breath and lower extremity swelling. She pointed out edema in her legs with very small blisters and some chronic venous stasis changes. She is not currently on a diuretic. She recently saw another new primary care provider with the [facility]. She was noted to be started on lisinopril 40 mg daily, which she is taking in addition to losartan 100 mg daily. The notes do not indicate from her office visit why she was started on lisinopril, but I can see through care everywhere that this was ordered by her primary care provider. She also takes amlodipine for blood pressure control. Her blood pressure was elevated initially at 178/94, but after resting came down to 120/78 and is at goal today. It is unusual, however to use both ACE inhibitors and ARBs.  07/02/2015  Jillian Eaton returns today for follow-up. She underwent a repeat echocardiogram for progressive dyspnea and exertion which showed a preserved LVEF of 6065% however there is moderate to severe pulmonary hypertension with an RVSP of 64 mmHg. This is increased about 10 mmHg compared to her prior study. Her mean pulmonary pressure by catheterization in 2012 was only 31 mmHg. I suspect that progressive pulmonary hypertension as a cause of her worsening shortness of breath. In fact, during recent hospitalization for surgery she required discharged with oxygen. She says she rarely uses oxygen at home but notes that she is hypoxic often when she  checks her oxygen levels. At her last office visit I recommended Lasix which she has been using sparingly. She has problems with incontinence and she does report an improvement in her swelling with it but does not notice significant change in her shortness of breath.  06/06/2016  Jillian Eaton returns from hospital follow-up. She was just admitted after an admission in January for unintentional narcotic overdose. She had unresponsiveness and possible aspiration. There was a second admission for a similar  presentation with respiratory failure, fever, sepsis and ultimately she was found to have a new onset cardiomyopathy with EF as low as 25%. Was felt that this was nonischemic. She was having intermittent atrial tachycardia and was felt that this might need to be managed with antiarrhythmic therapy however it seems to have resolved with carvedilol. She was placed on diuretics and reports that her breathing is close to baseline. She remains hypoxic with an O2 saturation 93% however was on home oxygen is result of her severe pulmonary hypertension. She does not feel that she needs to use the oxygen very regularly. From a heart failure standpoint she is on carvedilol, aspirin, furosemide and not currently on an ACE inhibitor, ARB or Entresto. Recent testing a renal function shows normal creatinine.  10/11/2016  Jillian Eaton is seen today in follow-up. In July she was hospitalized for hyperkalemia. She is on supplemental potassium and lisinopril, both were stopped. Fortunately her echo has improved back to normal recently. Overall she feels well. Her weight is down about 40 pounds of the recently she gained about 20 pounds back. She is working on that right as we speak.  04/03/2017  Jillian Eaton was seen today in follow-up.  She recently called in and had complaints of worsening shortness of breath.  She saw her pulmonary doctor who did an x-ray noted a mild right upper lobe infiltrate versus edema.  Labs were obtained including BNP which was only mildly elevated at 110.  She was given 20 mg of Lasix with no benefit and then recently was advised over the phone to increase her Lasix to 40 mg for 2 days.  She urinated quite a bit and lost about 5 pounds of what she feels was water weight.  She reports mild improvement in her breathing.  She is quite anxious about what might have led to this fluid gain.  Given her history of cardiomyopathy, it is possible she could have had some recurrent cardiomyopathy.  She reports  stable diet and no worsening abuse of sodium.  06/19/2017  Jillian Eaton returns today for follow-up.  She was supposed to have a limited echocardiogram but that never happened.  Approximately 2 to 3 weeks ago she called the office reporting palpitations.  We try to schedule an earlier appointment, but she ended up going to [facility] for her [facility].  On the flight she apparently became short of breath had some left flank pain and was hypoxic.  She was given oxygen and after landing they took her to the [facility] in [place].  She was treated there and found to be in A. fib with RVR.  This is a new finding.  She had a remote history of either PAT or PAF, but so brief that she was not anticoagulated.  She was then started on Xarelto.  She tells me she was given Lasix and diuresed.  She returns today for follow-up and still reports some shortness of breath.  Weight is about 6 pounds heavier than she was  2 months ago.  She is in A. fib persistently with heart rates in the low 100s.  In addition she reports after starting the Xarelto that she has been having some vaginal bleeding.  07/26/2017  Jillian Eaton returns today for follow-up.  She was recently seen in the hospital and discharged on 07/17/2017.  Today is a transition of care follow-up.  She was contacted on the day after discharge and felt to be doing well.  She is currently in rehabilitation.  She says she has had marked improvement in her shortness of breath.  She feels like her edema has improved.  She has decreased her dose of daily Lasix from 80 mg to 40 mg.  She is in persistent atrial fibrillation however rate controlled on amiodarone 400 mg which she is taking twice daily.  She reports she is getting stronger and hopefully will be out of rehabilitation within the next week.  She did miss 1 dose of Eliquis due to a transfer issue on May 28 and therefore will need 3 weeks of subsequent uninterrupted anticoagulation prior to attempted  cardioversion.  PMHx:  Past Medical History:  Diagnosis Date  . Anemia   . Asthma   . Chronic headache   . DOE (dyspnea on exertion)    2D ECHO, 02/12/2012 - EF 60-65%, moderate concentric hypertrophy  . Fibromyalgia    nerve pain"left side at waist level" "can't lay on that side without pain" , "HOB elevation helps"  . Hematuria - cause not known   . History of kidney stones    x 2 '13, '14 surgery to remove  . Hypertension   . SBO (small bowel obstruction) ([place]) 06/07/2013  . Thyroid disease    "goiter"  . Transfusion history    10 yrs+    Past Surgical History:  Procedure Laterality Date  . CARDIAC CATHETERIZATION  04/04/2010   No significant obstructive coronary artery disease  . CHOLECYSTECTOMY  1990  . COLONOSCOPY W/ POLYPECTOMY    . COLONOSCOPY WITH PROPOFOL N/A 04/10/2014   Procedure: COLONOSCOPY WITH PROPOFOL;  Surgeon: Beryle Beams, MD;  Location: [facility];  Service: Endoscopy;  Laterality: N/A;  . DIAGNOSTIC LAPAROSCOPY     x2 bowel obstructions(adhesions)  . 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 THYROIDECTOMY    . TUBAL LIGATION  1986    FAMHx:  Family History  Problem Relation Age of Onset  . Diabetes Mother   . Epilepsy Mother   . Cancer Mother        Breast  . Hypertension Mother   . Breast cancer Mother   . Kidney disease Father   . Diabetes Father   . Hypertension Father   . Asthma Father   . Heart disease Father   . Epilepsy Sister   . Cancer Maternal Grandmother   . Breast cancer Maternal Grandmother   . Cancer Paternal Grandmother   . Breast cancer Paternal Grandmother     SOCHx:   reports that she has never smoked. She has never used smokeless tobacco. She reports that she does not drink alcohol or use drugs.  ALLERGIES:  Allergies  Allergen Reactions  . Norco [Hydrocodone-Acetaminophen] Itching    ROS: Pertinent items noted in HPI and remainder of comprehensive ROS otherwise  negative.  HOME MEDS: Current Outpatient Medications  Medication Sig Dispense Refill  . acetaminophen (TYLENOL) 650 MG CR tablet Take 650 mg by mouth every 8 (eight) hours as  needed for pain.     Marland Kitchen amiodarone (PACERONE) 400 MG tablet Take 400 mg BID for 1 week then 400 mg daily 30 tablet 0  . apixaban (ELIQUIS) 5 MG TABS tablet Take 1 tablet (5 mg total) by mouth 2 (two) times daily. 60 tablet 6  . budesonide-formoterol (SYMBICORT) 160-4.5 MCG/ACT inhaler Inhale 2 puffs into the lungs 2 (two) times daily as needed (shortness of breath.).     Marland Kitchen calcium carbonate (OS-CAL - DOSED IN MG OF ELEMENTAL CALCIUM) 1250 (500 Ca) MG tablet Take 1 tablet (1,250 mg total) by mouth 3 (three) times daily with meals. 90 tablet 0  . carvedilol (COREG) 3.125 MG tablet Take 1 tablet (3.125 mg total) by mouth 2 (two) times daily with a meal. 60 tablet 0  . DULoxetine (CYMBALTA) 60 MG capsule Take 1 capsule (60 mg total) by mouth daily. 30 capsule 0  . eszopiclone (LUNESTA) 2 MG TABS tablet Take 2 mg by mouth at bedtime.  1  . ferrous sulfate 325 (65 FE) MG tablet Take 1 tablet (325 mg total) by mouth 3 (three) times daily with meals. 90 tablet 3  . furosemide (LASIX) 20 MG tablet Take 2 tablets (40 mg total) by mouth 2 (two) times daily. 60 tablet 11  . levothyroxine (SYNTHROID, LEVOTHROID) 200 MCG tablet Take 1 tablet (200 mcg total) by mouth daily before breakfast. 90 tablet 3  . LYRICA 200 MG capsule Take 200 mg by mouth 2 (two) times daily.   2  . mirabegron ER (MYRBETRIQ) 50 MG TB24 tablet Take 50 mg by mouth daily.    . OXYGEN Inhale 2 L/min into the lungs continuous.    Marland Kitchen senna (SENOKOT) 8.6 MG TABS tablet Take 1 tablet (8.6 mg total) by mouth daily. 120 each 0  . tiZANidine (ZANAFLEX) 4 MG tablet Take 4 mg by mouth every 6 (six) hours as needed for muscle spasms.    . vitamin B-12 100 MCG tablet Take 1 tablet (100 mcg total) by mouth daily. 30 tablet 0  . Vitamin D, Ergocalciferol, (DRISDOL) 50000 units  CAPS capsule Take 50,000 Units by mouth every 7 (seven) days.    . meloxicam (MOBIC) 15 MG tablet Take 1 tablet by mouth daily as needed.     No current facility-administered medications for this visit.     LABS/IMAGING: No results found for this or any previous visit (from the past 48 hour(s)). No results found.  VITALS: BP 99/60   Pulse 75   Ht 5\' 7"  (1.702 m)   Wt (!) 340 lb (154.2 kg) Comment: weighed last friday, unable to stand to weigh today  BMI 53.25 kg/m   EXAM: General appearance: alert, morbidly obese and in wheelchair Neck: no carotid bruit and no JVD Lungs: diminished breath sounds bilaterally Heart: regular rate and rhythm Abdomen: soft, non-tender; bowel sounds normal; no masses,  no organomegaly Extremities: extremities normal, atraumatic, no cyanosis or edema Pulses: 2+ and symmetric Skin: Skin color, texture, turgor normal. No rashes or lesions Neurologic: Grossly normal Psych: Pleasant  EKG: A. fib at 75, QTC 511 ms-personally reviewed  ASSESSMENT: 1. New onset A. fib with RVR-CHADSVASC score of 4 2. Acute diastolic congestive heart failure-LVEF 25% (improved to 60-65% in 06/2016) 3. Super morbid obesity, with failure of 3 gastric bypass procedures 4. Pulmonary hypertension - PA pressure of 64 mmHg, normal LV systolic function 5. Progressive DOE 6. Hypertension-controlled  7. Possible A. fib/ectopic atrial tachycardia-resolved  PLAN: 1.   Ms. Eaton was  recently hospitalized for acute diastolic congestive heart failure in the setting of new onset A. fib with RVR.  She also struggled with hypotension.  She is now rate controlled with her A. fib on amiodarone.  Her heart failure has improved significantly and weight is fairly stable.  She is decreased her dose of Lasix to 40 mg daily.  I would advise that she continue to follow daily weights.  We can go ahead and decrease her amiodarone to 400 mg daily, as she is sufficiently loaded and her QTC is slightly  prolonged today at 511 ms.  I would like to arrange for elective cardioversion in about 3 weeks.  She will need uninterrupted anticoagulation during that time.  Pixie Casino, MD, [facility], [place] Director of the Advanced Lipid Disorders &  Cardiovascular Risk Reduction Clinic Diplomate of the American Board of Clinical Lipidology Attending Cardiologist  Direct Dial: [phone]  Fax: [phone]  Website:  [email or web address] 07/26/2017, 10:17 AM

## 2017-07-26 NOTE — Patient Instructions (Addendum)
Medication Instructions: DECREASE the Amiodarone to 400 mg daily.  If you need a refill on your cardiac medications before your next appointment, please call your pharmacy.   Follow-Up: Your physician wants you to follow-up in one month with Dr. [name].   Thank you for choosing Heartcare at [facility]!!      Dear Alcide Evener [facility]  You are scheduled for  Cardioversion on June 19th with Dr. [name].  Please arrive at the [facility] (Main Entrance A) at [facility]: [address] [place], [place] [address] at 12 pm. (1 hour prior to procedure unless lab work is needed; if lab work is needed arrive 1.5 hours ahead)  DIET: Nothing to eat or drink after midnight except a sip of water with medications (see medication instructions below)  Medication Instructions: Continue your anticoagulant: Eliquis You will need to continue your anticoagulant after your procedure until you  are told by your  Provider that it is safe to stop   Labs: Please have the following labs drawn one week (around June 13th)  prior to the scheduled cardioversion: BMET and CBC.  You must have a responsible person to drive you home and stay in the waiting area during your procedure. Failure to do so could result in cancellation.  Bring your insurance cards.  *Special Note: Every effort is made to have your procedure done on time. Occasionally there are emergencies that occur at the hospital that may cause delays. Please be patient if a delay does occur.

## 2017-07-29 ENCOUNTER — Telehealth: Payer: Self-pay | Admitting: Nurse Practitioner

## 2017-07-29 NOTE — Telephone Encounter (Signed)
   Pt was seen by Dr. [name] on 6/6.  Recently d/c'd from [facility] following admission for rapid afib and diast chf.  Was doing well @ f/u on 6/6 @ a wt of 340 lbs.  Since then, wt has climbed to 349 lbs.  She is still @ rehab and has been doing well, participating in exercises w/o difficulty/dyspnea.  No palpitations. No lower ext edema.  Given wt gain, I've asked her to double lasix to 80 daily x next 3 days and then drop back down to 40 daily.  If wt does not come off or she notes poor response to lasix, she is to call us back tomorrow with an update.  Caller verbalized understanding and was grateful for the call back.  Murray Hodgkins, NP 07/29/2017, 1:05 PM

## 2017-07-30 ENCOUNTER — Other Ambulatory Visit: Payer: Self-pay | Admitting: *Deleted

## 2017-07-30 DIAGNOSIS — R609 Edema, unspecified: Secondary | ICD-10-CM | POA: Diagnosis not present

## 2017-07-30 DIAGNOSIS — I5032 Chronic diastolic (congestive) heart failure: Secondary | ICD-10-CM | POA: Diagnosis not present

## 2017-07-30 DIAGNOSIS — I4891 Unspecified atrial fibrillation: Secondary | ICD-10-CM

## 2017-07-30 DIAGNOSIS — R5383 Other fatigue: Secondary | ICD-10-CM | POA: Diagnosis not present

## 2017-07-31 DIAGNOSIS — R609 Edema, unspecified: Secondary | ICD-10-CM | POA: Diagnosis not present

## 2017-07-31 DIAGNOSIS — N179 Acute kidney failure, unspecified: Secondary | ICD-10-CM | POA: Diagnosis not present

## 2017-07-31 DIAGNOSIS — I5032 Chronic diastolic (congestive) heart failure: Secondary | ICD-10-CM | POA: Diagnosis not present

## 2017-07-31 DIAGNOSIS — R5383 Other fatigue: Secondary | ICD-10-CM | POA: Diagnosis not present

## 2017-08-01 ENCOUNTER — Telehealth: Payer: Self-pay | Admitting: Internal Medicine

## 2017-08-01 DIAGNOSIS — I5032 Chronic diastolic (congestive) heart failure: Secondary | ICD-10-CM | POA: Diagnosis not present

## 2017-08-01 DIAGNOSIS — J9611 Chronic respiratory failure with hypoxia: Secondary | ICD-10-CM | POA: Diagnosis not present

## 2017-08-01 DIAGNOSIS — G4733 Obstructive sleep apnea (adult) (pediatric): Secondary | ICD-10-CM | POA: Diagnosis not present

## 2017-08-01 DIAGNOSIS — R0602 Shortness of breath: Secondary | ICD-10-CM | POA: Diagnosis not present

## 2017-08-01 DIAGNOSIS — R5383 Other fatigue: Secondary | ICD-10-CM | POA: Diagnosis not present

## 2017-08-01 NOTE — Telephone Encounter (Signed)
New message   Patient calling to reschedule cardioversion

## 2017-08-01 NOTE — Telephone Encounter (Signed)
Spoke with pt who states she wanted to reschedule her cardioversion. Pt was informed the next date for Dr. [name] would be the first week in July. Pt states she doesn't want to wait that long and states she would like to keep scheduled date on 08/08/17.

## 2017-08-03 ENCOUNTER — Ambulatory Visit (INDEPENDENT_AMBULATORY_CARE_PROVIDER_SITE_OTHER): Payer: Medicare Other | Admitting: Internal Medicine

## 2017-08-03 ENCOUNTER — Telehealth: Payer: Self-pay | Admitting: Internal Medicine

## 2017-08-03 ENCOUNTER — Encounter: Payer: Self-pay | Admitting: Internal Medicine

## 2017-08-03 VITALS — BP 100/62 | HR 76 | Ht 67.0 in | Wt 355.0 lb

## 2017-08-03 DIAGNOSIS — E890 Postprocedural hypothyroidism: Secondary | ICD-10-CM

## 2017-08-03 DIAGNOSIS — C73 Malignant neoplasm of thyroid gland: Secondary | ICD-10-CM | POA: Diagnosis not present

## 2017-08-03 DIAGNOSIS — I5032 Chronic diastolic (congestive) heart failure: Secondary | ICD-10-CM | POA: Diagnosis not present

## 2017-08-03 DIAGNOSIS — R0602 Shortness of breath: Secondary | ICD-10-CM | POA: Diagnosis not present

## 2017-08-03 LAB — TSH: TSH: 1.48 u[IU]/mL (ref 0.35–4.50)

## 2017-08-03 LAB — MAGNESIUM: Magnesium: 1.5 mg/dL (ref 1.5–2.5)

## 2017-08-03 LAB — T4, FREE: Free T4: 1.04 ng/dL (ref 0.60–1.60)

## 2017-08-03 LAB — PHOSPHORUS: Phosphorus: 6.3 mg/dL — ABNORMAL HIGH (ref 2.3–4.6)

## 2017-08-03 NOTE — Progress Notes (Signed)
Patient ID: Jillian Eaton, female   DOB: Mar 21, 1950, 67 y.o.   MRN: [id number]    HPI  Jillian Eaton is a 67 y.o.-year-old female, referred by her PCP, Dr. 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 Fredirick Maudlin at [facility].  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  She developed postsurgical hypothyroidism and, unfortunately, also postsurgical hypocalcemia, both uncontrolled.  She is currently in a rehab facility to help improving her strength.  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 Benign hyperplastic thyroid tissue. See comment. COMMENT: The papillary carcinoma appears to be incidental.  On 06/19/2017 she developed atrial fibrillation.  At that time, TSH is very suppressed,  at 0.07.  She is on Levothyroxine 200 mcg (dose decreased 05/2017 from 300 mcg daily).  She takes the thyroid hormone: - fasting at 5 am - with water - separated by >30 min from b'fast  - + calcium - first dose at 8:30 am - + iron (not regularly before) - at 8:30.. H/o iron infusions. - no PPIs, multivitamins   I reviewed pt's thyroid tests: Lab Results  Component Value Date   TSH 0.007 (L) 06/19/2017   TSH 0.046 (L) 08/29/2016   TSH 9.239 (H) 04/25/2016   TSH 68.907 (H) 03/10/2016   FREET4 2.43 (H) 06/19/2017   FREET4 1.04 08/29/2016   Pt denies feeling nodules in neck, hoarseness, + dysphagia/no odynophagia, + SOB with lying down.  She has no FH of thyroid disorders. No FH of thyroid cancer.  No h/o radiation tx to head or neck. No recent use of iodine supplements.  Hypocalcemia:  Developed after her thyroid surgery,  despite the fact that no parathyroid glands were resected.  This is very uncontrolled.  She was admitted with hypocalcemia and acute respiratory failure on 07/10/2017.   I reviewed pt's pertinent labs - interestingly, her PTH was normal: Lab Results  Component Value Date   PTH 67 07/10/2017   PTH Comment 07/10/2017   CALCIUM 6.9 (L) 07/17/2017   CALCIUM 6.7 (L) 07/16/2017   CALCIUM 6.2 (LL) 07/15/2017   CALCIUM 6.0 (LL) 07/14/2017   CALCIUM 5.8 (LL) 07/13/2017   CALCIUM 5.9 (LL) 07/12/2017   CALCIUM 6.1 (LL) 07/11/2017   CALCIUM 5.8 (LL) 07/11/2017   CALCIUM 5.6 (LL) 07/11/2017   CALCIUM 5.5 (LL) 07/10/2017   She has a history of low magnesium and high phosphorus: Lab Results  Component Value Date   MG 2.1 07/15/2017   MG 1.6 (L) 07/14/2017   MG 1.3 (L) 07/13/2017   MG 1.2 (L) 07/10/2017   MG 1.8 07/11/2016   MG 2.2 07/09/2016   MG 1.6 (L) 07/09/2016   MG 1.9 07/08/2016   MG 2.0 07/08/2016   MG 1.6 (L) 07/08/2016   MG 1.6 (L) 05/03/2016   MG 1.8 04/25/2016   MG 1.9 04/25/2016   MG 2.0 04/24/2016   MG 1.2 (L) 04/23/2016   MG 2.2 03/12/2016   MG 1.4 (L) 03/10/2016   Lab Results  Component Value Date   PHOS 7.4 (H) 07/10/2017   PHOS 3.6 07/11/2016   PHOS 4.0 07/09/2016   PHOS 3.5 07/09/2016   PHOS 5.2 (H) 07/08/2016   PHOS 6.6 (H) 07/08/2016   PHOS 8.0 (H) 07/08/2016  PHOS 3.6 04/25/2016   PHOS 3.5 04/25/2016   PHOS 3.3 04/24/2016   PHOS 3.7 03/12/2016   PHOS 6.2 (H) 03/10/2016    She is on:  - CaCO3 1500 mg 3x a day with meals (1 tab 2-3x a day before hospitalization) - iv calcium gluconate 1 g daily - high-dose vitamin D 50,000 units weekly  No hand cramping or perioral numbness.  + h/o vitamin D deficiency.  Reviewed available Vit D levels: Lab Results  Component Value Date   VD25OH 43.9 07/14/2017   Of note, she is on amiodarone 400 mg daily, Lasix 40-80 mg daily.  + h/o CKD. Last BUN/Cr: Lab Results  Component Value Date   BUN 22 (H)  07/17/2017   CREATININE 1.12 (H) 07/17/2017    ROS: Constitutional: + Fatigue, + weight gain/weight loss, + feeling excessively hot and cold, + poor sleep, + nocturia, + increased urination Eyes: + blurry vision, no xerophthalmia ENT: no sore throat, + see HPI Cardiovascular: no CP/+ SOB/no palpitations +/leg swelling Respiratory: + Cough, + shortness of breath, + wheezing Gastrointestinal: + Nausea, + vomiting, + constipation Musculoskeletal: + Both muscle/joint aches Skin: no rashes, + easy bruising, + itching, + hair loss Neurological: no tremors/numbness/tingling/dizziness, + headaches Psychiatric: no depression/anxiety  + low libido  Past Medical History:  Diagnosis Date  . Anemia   . Asthma   . Chronic headache   . DOE (dyspnea on exertion)    2D ECHO, 02/12/2012 - EF 60-65%, moderate concentric hypertrophy  . Fibromyalgia    nerve pain"left side at waist level" "can't lay on that side without pain" , "HOB elevation helps"  . Hematuria - cause not known   . History of kidney stones    x 2 '13, '14 surgery to remove  . Hypertension   . SBO (small bowel obstruction) ([place]) 06/07/2013  . Thyroid disease    "goiter"  . Transfusion history    10 yrs+   Past Surgical History:  Procedure Laterality Date  . CARDIAC CATHETERIZATION  04/04/2010   No significant obstructive coronary artery disease  . CHOLECYSTECTOMY  1990  . COLONOSCOPY W/ POLYPECTOMY    . COLONOSCOPY WITH PROPOFOL N/A 04/10/2014   Procedure: COLONOSCOPY WITH PROPOFOL;  Surgeon: Beryle Beams, MD;  Location: [facility];  Service: Endoscopy;  Laterality: N/A;  . DIAGNOSTIC LAPAROSCOPY     x2 bowel obstructions(adhesions)  . 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 THYROIDECTOMY    . TUBAL LIGATION  1986   Social History   Socioeconomic History  . Marital status: Married    Spouse name: Not on file  . Number of children: 3  . Years of education: Not on file  .  Highest education level: Not on file  Occupational History  . Occupation: Self employed Armed forces operational officer  Social Needs  . Financial resource strain: Not on file  . Food insecurity:    Worry: Not on file    Inability: Not on file  . Transportation needs:    Medical: Not on file    Non-medical: Not on file  Tobacco Use  . Smoking status: Never Smoker  . Smokeless tobacco: Never Used  Substance and Sexual Activity  . Alcohol use: No    Comment: wine occ  . Drug use: No    Types: Oxycodone    Comment: perscribed  . Sexual activity: Not Currently    Birth control/protection: None  Lifestyle  .  Physical activity:    Days per week: Not on file    Minutes per session: Not on file  . Stress: Not on file  Relationships  . Social connections:    Talks on phone: Not on file    Gets together: Not on file    Attends religious service: Not on file    Active member of club or organization: Not on file    Attends meetings of clubs or organizations: Not on file    Relationship status: Not on file  . Intimate partner violence:    Fear of current or ex partner: Not on file    Emotionally abused: Not on file    Physically abused: Not on file    Forced sexual activity: Not on file  Other Topics Concern  . Not on file  Social History Narrative   ** Merged History Encounter **       Current Outpatient Medications on File Prior to Visit  Medication Sig Dispense Refill  . acetaminophen (TYLENOL) 650 MG CR tablet Take 650 mg by mouth every 8 (eight) hours as needed for pain.     Marland Kitchen amiodarone (PACERONE) 400 MG tablet Take 400 mg daily 30 tablet 0  . apixaban (ELIQUIS) 5 MG TABS tablet Take 1 tablet (5 mg total) by mouth 2 (two) times daily. 60 tablet 6  . budesonide-formoterol (SYMBICORT) 160-4.5 MCG/ACT inhaler Inhale 2 puffs into the lungs 2 (two) times daily as needed (shortness of breath.).     Marland Kitchen calcium carbonate (OS-CAL - DOSED IN MG OF ELEMENTAL CALCIUM) 1250 (500 Ca) MG tablet Take 1  tablet (1,250 mg total) by mouth 3 (three) times daily with meals. 90 tablet 0  . carvedilol (COREG) 3.125 MG tablet Take 1 tablet (3.125 mg total) by mouth 2 (two) times daily with a meal. 60 tablet 0  . DULoxetine (CYMBALTA) 60 MG capsule Take 1 capsule (60 mg total) by mouth daily. 30 capsule 0  . eszopiclone (LUNESTA) 2 MG TABS tablet Take 2 mg by mouth at bedtime.  1  . ferrous sulfate 325 (65 FE) MG tablet Take 1 tablet (325 mg total) by mouth 3 (three) times daily with meals. 90 tablet 3  . furosemide (LASIX) 40 MG tablet Take 1 tablet (40 mg total) by mouth daily.    Marland Kitchen levothyroxine (SYNTHROID, LEVOTHROID) 200 MCG tablet Take 1 tablet (200 mcg total) by mouth daily before breakfast. 90 tablet 3  . LYRICA 200 MG capsule Take 200 mg by mouth 2 (two) times daily.   2  . meloxicam (MOBIC) 15 MG tablet Take 1 tablet by mouth daily as needed.    . mirabegron ER (MYRBETRIQ) 50 MG TB24 tablet Take 50 mg by mouth daily.    . OXYGEN Inhale 2 L/min into the lungs continuous.    Marland Kitchen senna (SENOKOT) 8.6 MG TABS tablet Take 1 tablet (8.6 mg total) by mouth daily. 120 each 0  . tiZANidine (ZANAFLEX) 4 MG tablet Take 4 mg by mouth every 6 (six) hours as needed for muscle spasms.    . vitamin B-12 100 MCG tablet Take 1 tablet (100 mcg total) by mouth daily. 30 tablet 0  . Vitamin D, Ergocalciferol, (DRISDOL) 50000 units CAPS capsule Take 50,000 Units by mouth every 7 (seven) days.     No current facility-administered medications on file prior to visit.    Allergies  Allergen Reactions  . Norco [Hydrocodone-Acetaminophen] Itching   Family History  Problem Relation Age of Onset  .  Diabetes Mother   . Epilepsy Mother   . Cancer Mother        Breast  . Hypertension Mother   . Breast cancer Mother   . Kidney disease Father   . Diabetes Father   . Hypertension Father   . Asthma Father   . Heart disease Father   . Epilepsy Sister   . Cancer Maternal Grandmother   . Breast cancer Maternal  Grandmother   . Cancer Paternal Grandmother   . Breast cancer Paternal Grandmother     PE: BP 100/62   Pulse 76   Ht 5' 7"  (1.702 m)   Wt (!) 355 lb (161 kg)   SpO2 90%   BMI 55.60 kg/m  Wt Readings from Last 3 Encounters:  08/03/17 (!) 355 lb (161 kg)  07/26/17 (!) 340 lb (154.2 kg)  07/17/17 (!) 337 lb 4.9 oz (153 kg)   Constitutional: Obese, in NAD. No kyphosis. Eyes: PERRLA, EOMI, no exophthalmos ENT: moist mucous membranes, no thyromegaly, no cervical lymphadenopathy Cardiovascular: RRR, No MRG, + lymphedema bilateral legs Respiratory: CTA B Gastrointestinal: abdomen soft, NT, ND, BS+ Musculoskeletal: no deformities, strength intact in all 4 Skin: moist, warm, no rashes Neurological: no tremor with outstretched hands, DTR normal in all 4  Assessment: 1. Papillary thyroid cancer  2. Postsurgical hypothyroidism  3.  Postsurgical  hypocalcemia  PLAN: 1. PTC - Incidentally found that the time of her thyroidectomy for enlarging nontoxic multinodular goiter. - The focus of papillary thyroid cancer was subcentimeter and it was most likely cured by thyroidectomy - No imaging follow-up is necessary, but will continue to follow her clinically  2. Patient with hypothyroidism developed after her thyroidectomy, on levothyroxine therapy, with poor control. - Reviewed latest TFTs and her TSH was suppressed for the last year -She is on levothyroxine, initially 300 mcg daily then 200 mcg daily, dose decreased after last set of TFTs returned abnormal - she appears euthyroid.  - We discussed about correct intake of levothyroxine, fasting, with water, separated by at least 30 minutes from breakfast, and separated by more than 4 hours from calcium, iron, multivitamins, acid reflux medications (PPIs). - will check thyroid tests today: TSH, free T4 - If labs today are abnormal, she will need to return in ~6 weeks for repeat labs - Otherwise, I will see her back in 4 months  3. Patient  with postoperative, iatrogenic, hypocalcemia, with recent admission for acute respiratory failure in the setting of a low blood calcium, of 5.5.  At that time, vitamin D level was normal, at 43.9.  She does have a history of magnesium deficiency and high phosphorus. - No apparent signs/sxs from hypocalcemia at today's visit: no perioral numbness, no acral cramping; [place] sign negative. - I discussed with the patient about the physiology of calcium and parathyroid hormone and I explained that the most likely etiology for her hypocalcemia is her previous surgery.  Sometimes hypocalcemia can develop years after neck surgery. - I will check: Ionized and total calcium level intact PTH ([facility]) Magnesium Phosphorus vitamin D 1,25 HO - I will wait for the results of the above labs and will discuss with the plan with the patient. However, we discussed that most likely will need to add calcitriol to her regimen.  As a next step, we may need to add HCTZ, at least to replace some of her diuretic dose.  The third step would be Natpara  >> we discussed about this at today's visit and she agrees  to try it if needed.  Our main goal for now is for her to be able to be taken off her IV calcium and then hopefully to maintain a calcium that is low normal or slightly under the lower limit of normal - For now we will continue her current calcium and vitamin D supplementation - I will see the patient back in 4 months  Office Visit on 08/03/2017  Component Date Value Ref Range Status  . Vitamin D 1, 25 (OH)2 Total 08/03/2017 59  18 - 72 pg/mL Final  . Vitamin D3 1, 25 (OH)2 08/03/2017 11  pg/mL Final  . Vitamin D2 1, 25 (OH)2 08/03/2017 48  pg/mL Final   Comment: Marland Kitchen Vitamin D3, 1,25(OH) indicates both endogenous production and supplementation. Vitamin D2, 1,25(OH)2 is an indicator of exogenous sources, such as diet or supplementation.  Interpretation and therapy are based on measurement of Vitamin D,1,25(OH)2,  Total. . . This test was developed and its analytical performance characteristics have been determined by [facility], [place], [place]. It has not been cleared or approved by the FDA. This assay has been validated pursuant to the CLIA regulations and is used for clinical purposes. .   . Phosphorus 08/03/2017 6.3* 2.3 - 4.6 mg/dL Final  . Magnesium 08/03/2017 1.5  1.5 - 2.5 mg/dL Final  . Calcium, Ion 08/03/2017 2.7* 4.8 - 5.6 mg/dL Final   Comment: Verified by repeat analysis. .   . TSH 08/03/2017 1.48  0.35 - 4.50 uIU/mL Final  . Free T4 08/03/2017 1.04  0.60 - 1.60 ng/dL Final   Comment: Specimens from patients who are undergoing biotin therapy and /or ingesting biotin supplements may contain high levels of biotin.  The higher biotin concentration in these specimens interferes with this Free T4 assay.  Specimens that contain high levels  of biotin may cause false high results for this Free T4 assay.  Please interpret results in light of the total clinical presentation of the patient.    . Glucose, Bld 08/03/2017 87  65 - 99 mg/dL Final   Comment: .            Fasting reference interval .   . BUN 08/03/2017 45* 7 - 25 mg/dL Final  . Creat 08/03/2017 1.58* 0.50 - 0.99 mg/dL Final   Comment: For patients >69 years of age, the reference limit for Creatinine is approximately 13% higher for people identified as African-American. .   . GFR, Est Non African American 08/03/2017 34* > OR = 60 mL/min/1.37m Final  . GFR, Est African American 08/03/2017 39* > OR = 60 mL/min/1.72mFinal  . BUN/Creatinine Ratio 08/03/2017 28* 6 - 22 (calc) Final  . Sodium 08/03/2017 143  135 - 146 mmol/L Final  . Potassium 08/03/2017 4.5  3.5 - 5.3 mmol/L Final  . Chloride 08/03/2017 101  98 - 110 mmol/L Final  . CO2 08/03/2017 33* 20 - 32 mmol/L Final  . Calcium 08/03/2017 5.7* 8.6 - 10.4 mg/dL Final   Comment: Verified by repeat analysis. .   . PTH 08/03/2017 44  15 - 65  pg/mL Final   Message sent: Dear Ms. Uhls, Here are all of the results from Friday. Your calcium, as you know, was quite low.  Your phosphorus was high, which is expected from abnormal parathyroid function (after your thyroid surgery, as we discussed).   Thyroid tests were normal. Kidney function was a little lower than before.  Please stay well hydrated. Let us[place]ao ahead and start  calcitriol 0.5 mcg twice a day, as I believe you are ready started.  In 3 days, please come back for another calcium and phosphorus level. Sincerely, Philemon Kingdom MD  Philemon Kingdom, MD PhD [facility]

## 2017-08-03 NOTE — Telephone Encounter (Signed)
Received call from [facility]  About calcium 5.7 critical She is not a [place] primary care patient but   Attempting   to contact  endocrinology call  Dr Dwyane Dee  For review and plan .      Results received about 11 10 tonight but were drawn at about noon 6 14   review of note showed  Not symptomatic of import today in the office .

## 2017-08-03 NOTE — Patient Instructions (Addendum)
Please stop at the lab.  Please continue: - Tums 1500 mg 3x a day with meals - High-dose vitamin D 50,000 units weekly - iv calcium gluconate 1 g daily  Please return in 3 months.  Hypocalcemia, Adult Hypocalcemia is when the level of calcium in a person's blood is below normal. Calcium is a mineral that is used by the body in many ways. A lack of blood calcium can affect the heart and muscles, make the bones more likely to break, and cause other problems. What are the causes? This condition may be caused by:  Decreased production (hypoparathyroidism) or improper use of parathyroid hormone.  Problems with the parathyroid glands or surgical removal of these glands.  Problems with parathyroid function after removal of the thyroid gland.  Lack (deficiency) of vitamin D or magnesium or both.  Kidney problems.  Less common causes include:  Intestinal problems that interfere with nutrient absorption.  Alcoholism.  Low levels of a body protein that is called albumin.  Inflammation of the pancreas (pancreatitis).  Certain medicines.  Severe infections (sepsis).  Certain diseases, such as sarcoidosis or hemochromatosis, that cause the parathyroid glands to be filled with cells or substances that are not normally present.  Breakdown of large amounts of muscle fiber.  High levels of phosphate in the body.  Cancer.  Massive blood transfusions, which usually occur with severe trauma.  What are the signs or symptoms? Symptoms of this condition include:  Numbness and tingling in the fingers, toes, or around the mouth.  Muscle aches or cramps, especially in the legs, feet, and back.  Muscle twitches.  Abdominal cramping or pain.  Memory problems, confusion, or difficulty thinking.  Depression, anxiety, irritability, or changes in personality.  Fainting.  Chest pain.  Difficulty swallowing.  Changes in the sound of the voice.  Shortness of breath or  wheezing.  General weakness and fatigue.  Symptoms of severe hypocalcemia include:  Shaking uncontrollably (seizures).  Seizure of the voice box (laryngospasm).  Fast heartbeats (palpitations) and abnormal heart rhythms (arrhythmias).  Long-term symptoms of this condition include:  Coarse, brittle hair and nails.  Dry skin or lasting (chronic) skin diseases (psoriasis, eczema,, or dermatitis).  Clouding of the eye lens (cataracts).  How is this diagnosed? This condition is usually diagnosed with a blood test. You may also have other tests to help determine the underlying cause of the condition. For example, a test may be done that records the electrical activity of the heart (electrocardiogram,or ECG). How is this treated? Treatment for this condition may include:  Calcium given by mouth (orally) or given through an IV tube that is inserted into one of your veins. The method used for giving calcium will depend on the severity of the condition.  Other minerals (electrolytes), such as magnesium.  Other treatment will depend on the cause of the condition. Follow these instructions at home:  Follow diet instructions from your health care provider or dietitian.  Take supplements only as told by your health care provider.  Keep all follow-up visits as told by your health care provider. This is important. Contact a health care provider if:  You have increased fatigue.  You have increased muscle twitching.  You have new swelling in the feet, ankles, or legs.  You develop changes in mood, memory, or personality. Get help right away if:  You have chest pain.  You have persistent rapid or irregular heartbeats.  You have difficulty breathing.  You faint.  You start to have  seizures.  You have confusion. This information is not intended to replace advice given to you by your health care provider. Make sure you discuss any questions you have with your health care  provider. Document Released: 07/27/2009 Document Revised: 07/15/2015 Document Reviewed: 06/24/2014 Elsevier Interactive Patient Education  [employer].

## 2017-08-04 ENCOUNTER — Other Ambulatory Visit: Payer: Self-pay | Admitting: Endocrinology

## 2017-08-04 ENCOUNTER — Encounter (INDEPENDENT_AMBULATORY_CARE_PROVIDER_SITE_OTHER): Payer: Self-pay

## 2017-08-04 LAB — PARATHYROID HORMONE, INTACT (NO CA): PTH: 44 pg/mL (ref 15–65)

## 2017-08-04 MED ORDER — CALCITRIOL 0.5 MCG PO CAPS: 0.5000 ug | ORAL_CAPSULE | Freq: Two times a day (BID) | ORAL | 0 refills | Status: DC

## 2017-08-04 NOTE — Progress Notes (Unsigned)
Called about panic calcium and panic ionized calcium of 2.7. Patient not available on telephone and message left on voicemail She will start calcitriol 0.5 mg twice daily and follow-up on Monday

## 2017-08-04 NOTE — Telephone Encounter (Signed)
Calcitriol called in and my chart message sent to patient as well as voicemail

## 2017-08-05 LAB — VITAMIN D 1,25 DIHYDROXY
VITAMIN D 1, 25 (OH) TOTAL: 59 pg/mL (ref 18–72)
VITAMIN D3 1, 25 (OH): 11 pg/mL
Vitamin D2 1, 25 (OH)2: 48 pg/mL

## 2017-08-05 LAB — CALCIUM, IONIZED: CALCIUM ION: 2.7 mg/dL — AB (ref 4.8–5.6)

## 2017-08-05 LAB — BASIC METABOLIC PANEL WITH GFR
BUN/Creatinine Ratio: 28 (calc) — ABNORMAL HIGH (ref 6–22)
BUN: 45 mg/dL — ABNORMAL HIGH (ref 7–25)
CO2: 33 mmol/L — AB (ref 20–32)
CREATININE: 1.58 mg/dL — AB (ref 0.50–0.99)
Calcium: 5.7 mg/dL — CL (ref 8.6–10.4)
Chloride: 101 mmol/L (ref 98–110)
GFR, Est African American: 39 mL/min/{1.73_m2} — ABNORMAL LOW (ref >=60)
GFR, Est Non African American: 34 mL/min/{1.73_m2} — ABNORMAL LOW (ref >=60)
GLUCOSE: 87 mg/dL (ref 65–99)
Potassium: 4.5 mmol/L (ref 3.5–5.3)
Sodium: 143 mmol/L (ref 135–146)

## 2017-08-06 ENCOUNTER — Encounter: Payer: Self-pay | Admitting: Internal Medicine

## 2017-08-06 ENCOUNTER — Other Ambulatory Visit: Payer: Self-pay | Admitting: Internal Medicine

## 2017-08-06 DIAGNOSIS — I5032 Chronic diastolic (congestive) heart failure: Secondary | ICD-10-CM | POA: Diagnosis not present

## 2017-08-06 DIAGNOSIS — M797 Fibromyalgia: Secondary | ICD-10-CM | POA: Diagnosis not present

## 2017-08-06 DIAGNOSIS — G4700 Insomnia, unspecified: Secondary | ICD-10-CM | POA: Diagnosis not present

## 2017-08-06 DIAGNOSIS — R609 Edema, unspecified: Secondary | ICD-10-CM | POA: Diagnosis not present

## 2017-08-06 NOTE — Progress Notes (Signed)
Pt stated she should be discharged on Wednesday afternoon, and is trying to come thrusday but will call if something changes.

## 2017-08-06 NOTE — Progress Notes (Signed)
Pt is aware states she is having a heart procedure Wednesday. She started medication today. Will try to come in Thursday or Friday for labs.

## 2017-08-06 NOTE — Progress Notes (Signed)
Sounds good, I would want her to come on Thu pm or so, so I can get the labs before Fri at noon since I am out after that.

## 2017-08-06 NOTE — Progress Notes (Signed)
Jillian Eaton, can you please call pt:  I agree with starting calcitriol at 0.5 mg twice a day and I would like her to come back for labs in 2 to 3 days after starting this.

## 2017-08-07 DIAGNOSIS — I5032 Chronic diastolic (congestive) heart failure: Secondary | ICD-10-CM | POA: Diagnosis not present

## 2017-08-07 DIAGNOSIS — N179 Acute kidney failure, unspecified: Secondary | ICD-10-CM | POA: Diagnosis not present

## 2017-08-07 DIAGNOSIS — R609 Edema, unspecified: Secondary | ICD-10-CM | POA: Diagnosis not present

## 2017-08-08 ENCOUNTER — Ambulatory Visit (HOSPITAL_COMMUNITY): Payer: Medicare Other | Admitting: Certified Registered"

## 2017-08-08 ENCOUNTER — Other Ambulatory Visit: Payer: Self-pay

## 2017-08-08 ENCOUNTER — Encounter (HOSPITAL_COMMUNITY)
Admission: RE | Disposition: A | Discharge status: Home / Self Care | Payer: Self-pay | Source: Ambulatory Visit | Attending: Internal Medicine

## 2017-08-08 ENCOUNTER — Ambulatory Visit (HOSPITAL_COMMUNITY)
Admission: RE | Admit: 2017-08-08 | Discharge: 2017-08-08 | Disposition: A | Discharge status: Home / Self Care | Payer: Medicare Other | Source: Ambulatory Visit | Attending: Internal Medicine | Admitting: Internal Medicine

## 2017-08-08 ENCOUNTER — Encounter (HOSPITAL_COMMUNITY): Payer: Self-pay

## 2017-08-08 DIAGNOSIS — Z9884 Bariatric surgery status: Secondary | ICD-10-CM | POA: Insufficient documentation

## 2017-08-08 DIAGNOSIS — I272 Pulmonary hypertension, unspecified: Secondary | ICD-10-CM | POA: Diagnosis not present

## 2017-08-08 DIAGNOSIS — Z6841 Body Mass Index (BMI) 40.0 and over, adult: Secondary | ICD-10-CM | POA: Insufficient documentation

## 2017-08-08 DIAGNOSIS — K219 Gastro-esophageal reflux disease without esophagitis: Secondary | ICD-10-CM | POA: Insufficient documentation

## 2017-08-08 DIAGNOSIS — Z9981 Dependence on supplemental oxygen: Secondary | ICD-10-CM | POA: Diagnosis not present

## 2017-08-08 DIAGNOSIS — I5031 Acute diastolic (congestive) heart failure: Secondary | ICD-10-CM | POA: Diagnosis not present

## 2017-08-08 DIAGNOSIS — J45909 Unspecified asthma, uncomplicated: Secondary | ICD-10-CM | POA: Insufficient documentation

## 2017-08-08 DIAGNOSIS — I10 Essential (primary) hypertension: Secondary | ICD-10-CM | POA: Insufficient documentation

## 2017-08-08 DIAGNOSIS — I110 Hypertensive heart disease with heart failure: Secondary | ICD-10-CM | POA: Diagnosis not present

## 2017-08-08 DIAGNOSIS — I4891 Unspecified atrial fibrillation: Secondary | ICD-10-CM

## 2017-08-08 HISTORY — PX: CARDIOVERSION: SHX1299

## 2017-08-08 SURGERY — CARDIOVERSION
Anesthesia: General

## 2017-08-08 MED ORDER — SODIUM CHLORIDE 0.9 % IV SOLN
INTRAVENOUS | Status: DC
  Administered 2017-08-08: 500 mL via INTRAVENOUS

## 2017-08-08 MED ORDER — PROPOFOL 10 MG/ML IV BOLUS
INTRAVENOUS | Status: DC | PRN
  Administered 2017-08-08: 100 mg via INTRAVENOUS

## 2017-08-08 MED ORDER — LIDOCAINE 2% (20 MG/ML) 5 ML SYRINGE
INTRAMUSCULAR | Status: DC | PRN
  Administered 2017-08-08: 100 mg via INTRAVENOUS

## 2017-08-08 NOTE — CV Procedure (Signed)
   CARDIOVERSION NOTE  Procedure: Electrical Cardioversion Indications:  Atrial Fibrillation  Procedure Details:  Consent: Risks of procedure as well as the alternatives and risks of each were explained to the (patient/caregiver).  Consent for procedure obtained.  Time Out: Verified patient identification, verified procedure, site/side was marked, verified correct patient position, special equipment/implants available, medications/allergies/relevent history reviewed, required imaging and test results available.  Performed  Patient placed on cardiac monitor, pulse oximetry, supplemental oxygen as necessary.  Sedation given: Propofol per anesthesia Pacer pads placed anterior and posterior chest.  Cardioverted 3 time(s).  Cardioverted at 150J, 200J x2 unsuccessful.  Impression: Findings: Post procedure EKG shows: Atrial Fibrillation Complications: None Patient did tolerate procedure well.  Plan: 1. Unsuccessful DCCV after 3 stacked shocks. 2. Continue rate control and anticoagulation. 3. Follow-up as scheduled with me in July.  Time Spent Directly with the Patient:  30 minutes   Pixie Casino, MD, [facility], [place] Director of the Advanced Lipid Disorders &  Cardiovascular Risk Reduction Clinic Diplomate of the American Board of Clinical Lipidology Attending Cardiologist  Direct Dial: [phone]  Fax: [phone]  Website:  [email or web address] 08/08/2017, 1:18 PM

## 2017-08-08 NOTE — Anesthesia Preprocedure Evaluation (Signed)
Anesthesia Evaluation  Patient identified by MRN, date of birth, ID band Patient awake    Reviewed: Allergy & Precautions, H&P , NPO status , Patient's Chart, lab work & pertinent test results, reviewed documented beta blocker date and time   Airway Mallampati: II  TM Distance: >3 FB Neck ROM: full    Dental no notable dental hx.    Pulmonary neg pulmonary ROS, shortness of breath, at rest and Long-Term Oxygen Therapy, asthma ,    Pulmonary exam normal breath sounds clear to auscultation       Cardiovascular hypertension, On Medications negative cardio ROS   Rhythm:regular Rate:Normal     Neuro/Psych negative neurological ROS  negative psych ROS   GI/Hepatic negative GI ROS, Neg liver ROS, GERD  ,  Endo/Other  negative endocrine ROSHypothyroidism Morbid obesity  Renal/GU negative Renal ROS  negative genitourinary   Musculoskeletal negative musculoskeletal ROS (+)   Abdominal   Peds negative pediatric ROS (+)  Hematology negative hematology ROS (+)   Anesthesia Other Findings 1.   Super morbid obesity, with failure of 3 gastric bypass procedures 2.   Pulmonary hypertension - PA pressure of 51 mmHg 3.   Stable dyspnea on exertion 4.   Hypertension-controlled  Reproductive/Obstetrics negative OB ROS                             Anesthesia Physical  Anesthesia Plan  ASA: III  Anesthesia Plan: General   Post-op Pain Management:    Induction: Intravenous  PONV Risk Score and Plan: 3 and Treatment may vary due to age or medical condition  Airway Management Planned: Natural Airway and Mask  Additional Equipment:   Intra-op Plan:   Post-operative Plan:   Informed Consent: I have reviewed the patients History and Physical, chart, labs and discussed the procedure including the risks, benefits and alternatives for the proposed anesthesia with the patient or authorized representative  who has indicated his/her understanding and acceptance.     Plan Discussed with: CRNA and Surgeon  Anesthesia Plan Comments:         Anesthesia Quick Evaluation

## 2017-08-08 NOTE — Transfer of Care (Signed)
Immediate Anesthesia Transfer of Care Note  Patient: Jillian Eaton  Procedure(s) Performed: CARDIOVERSION (N/A )  Patient Location: Endoscopy Unit  Anesthesia Type:MAC  Level of Consciousness: drowsy and patient cooperative  Airway & Oxygen Therapy: Patient Spontanous Breathing and Patient connected to nasal cannula oxygen  Post-op Assessment: Report given to RN and Post -op Vital signs reviewed and stable  Post vital signs: Reviewed and stable  Last Vitals:  Vitals Value Taken Time  BP    Temp    Pulse    Resp    SpO2      Last Pain:  Vitals:   08/08/17 1137  TempSrc: Oral  PainSc: 0-No pain         Complications: No apparent anesthesia complications

## 2017-08-08 NOTE — H&P (Signed)
   INTERVAL PROCEDURE H&P  History and Physical Interval Note:  08/08/2017 11:14 AM  Jillian Eaton has presented today for their planned procedure. The various methods of treatment have been discussed with the patient and family. After consideration of risks, benefits and other options for treatment, the patient has consented to the procedure.  The patients' outpatient history has been reviewed, patient examined, and no change in status from most recent office note within the past 30 days. I have reviewed the patients' chart and labs and will proceed as planned. Questions were answered to the patient's satisfaction.   Pixie Casino, MD, [facility], [place] Director of the [facility] Diplomate of the American Board of Clinical Lipidology Attending Cardiologist  Direct Dial: [phone]  Fax: [phone]  Website:  [email or web address] 08/08/2017, 11:14 AM

## 2017-08-08 NOTE — Anesthesia Postprocedure Evaluation (Signed)
Anesthesia Post Note  Patient: Jillian Eaton  Procedure(s) Performed: CARDIOVERSION (N/A )     Patient location during evaluation: PACU Anesthesia Type: General Level of consciousness: awake and alert Pain management: pain level controlled Vital Signs Assessment: post-procedure vital signs reviewed and stable Respiratory status: spontaneous breathing, nonlabored ventilation and respiratory function stable Cardiovascular status: blood pressure returned to baseline and stable Postop Assessment: no apparent nausea or vomiting Anesthetic complications: no    Last Vitals:  Vitals:   08/08/17 1330 08/08/17 1340  BP: 131/72 (!) 143/87  Pulse: 74 70  Resp: 16 15  Temp:    SpO2: 93% 96%    Last Pain:  Vitals:   08/08/17 1340  TempSrc:   PainSc: 0-No pain                 Lynda Rainwater

## 2017-08-08 NOTE — Discharge Instructions (Signed)
Electrical Cardioversion, Care After °This sheet gives you information about how to care for yourself after your procedure. Your health care provider may also give you more specific instructions. If you have problems or questions, contact your health care provider. °What can I expect after the procedure? °After the procedure, it is common to have: °· Some redness on the skin where the shocks were given. ° °Follow these instructions at home: °· Do not drive for 24 hours if you were given a medicine to help you relax (sedative). °· Take over-the-counter and prescription medicines only as told by your health care provider. °· Ask your health care provider how to check your pulse. Check it often. °· Rest for 48 hours after the procedure or as told by your health care provider. °· Avoid or limit your caffeine use as told by your health care provider. °Contact a health care provider if: °· You feel like your heart is beating too quickly or your pulse is not regular. °· You have a serious muscle cramp that does not go away. °Get help right away if: °· You have discomfort in your chest. °· You are dizzy or you feel faint. °· You have trouble breathing or you are short of breath. °· Your speech is slurred. °· You have trouble moving an arm or leg on one side of your body. °· Your fingers or toes turn cold or blue. °This information is not intended to replace advice given to you by your health care provider. Make sure you discuss any questions you have with your health care provider. °Document Released: 11/27/2012 Document Revised: 09/10/2015 Document Reviewed: 08/13/2015 °[employer]vier Interactive Patient Education © 2018 [employer] ° °

## 2017-08-09 ENCOUNTER — Encounter: Payer: Self-pay | Admitting: Internal Medicine

## 2017-08-09 ENCOUNTER — Encounter (HOSPITAL_COMMUNITY): Payer: Self-pay | Admitting: Internal Medicine

## 2017-08-09 ENCOUNTER — Other Ambulatory Visit: Payer: Self-pay | Admitting: Internal Medicine

## 2017-08-09 DIAGNOSIS — J449 Chronic obstructive pulmonary disease, unspecified: Secondary | ICD-10-CM | POA: Diagnosis not present

## 2017-08-09 DIAGNOSIS — I4891 Unspecified atrial fibrillation: Secondary | ICD-10-CM | POA: Diagnosis not present

## 2017-08-09 DIAGNOSIS — I509 Heart failure, unspecified: Secondary | ICD-10-CM | POA: Diagnosis not present

## 2017-08-09 DIAGNOSIS — R5383 Other fatigue: Secondary | ICD-10-CM | POA: Diagnosis not present

## 2017-08-09 MED ORDER — HYDROCHLOROTHIAZIDE 25 MG PO TABS: 25.0000 mg | ORAL_TABLET | Freq: Every day | ORAL | 3 refills | Status: DC

## 2017-08-09 NOTE — Progress Notes (Signed)
Received labs from: 08/06/2017: Calcium 5.8 (8.6-10.4), BUN/creatinine 48/1.53, GFR 41, alkaline phosphatase 83 08/08/2017: Calcium 5.9, phosphorus 5.9 (2.1-4.3), BUN/creatinine 49/1.45, GFR 44, alkaline phosphatase 82  The labs were obtained after we added calcitriol 0.5 mcg twice a day.  After the above labs, I will advise her to start HCTZ 25 mg daily along with 20 mg of Lasix daily (will decrease this from 40 mg daily).    We will plan to have another set of labs checked in 1 week.

## 2017-08-10 DIAGNOSIS — R609 Edema, unspecified: Secondary | ICD-10-CM | POA: Diagnosis not present

## 2017-08-10 DIAGNOSIS — R5383 Other fatigue: Secondary | ICD-10-CM | POA: Diagnosis not present

## 2017-08-10 DIAGNOSIS — I509 Heart failure, unspecified: Secondary | ICD-10-CM | POA: Diagnosis not present

## 2017-08-10 DIAGNOSIS — N179 Acute kidney failure, unspecified: Secondary | ICD-10-CM | POA: Diagnosis not present

## 2017-08-14 DIAGNOSIS — G4700 Insomnia, unspecified: Secondary | ICD-10-CM | POA: Diagnosis not present

## 2017-08-14 DIAGNOSIS — G8929 Other chronic pain: Secondary | ICD-10-CM | POA: Diagnosis not present

## 2017-08-14 DIAGNOSIS — M797 Fibromyalgia: Secondary | ICD-10-CM | POA: Diagnosis not present

## 2017-08-14 DIAGNOSIS — I5032 Chronic diastolic (congestive) heart failure: Secondary | ICD-10-CM | POA: Diagnosis not present

## 2017-08-15 DIAGNOSIS — E039 Hypothyroidism, unspecified: Secondary | ICD-10-CM | POA: Diagnosis not present

## 2017-08-15 DIAGNOSIS — I5032 Chronic diastolic (congestive) heart failure: Secondary | ICD-10-CM | POA: Diagnosis not present

## 2017-08-15 DIAGNOSIS — I270 Primary pulmonary hypertension: Secondary | ICD-10-CM | POA: Diagnosis not present

## 2017-08-15 DIAGNOSIS — I482 Chronic atrial fibrillation: Secondary | ICD-10-CM | POA: Diagnosis not present

## 2017-08-27 DIAGNOSIS — R319 Hematuria, unspecified: Secondary | ICD-10-CM | POA: Diagnosis not present

## 2017-08-27 DIAGNOSIS — I129 Hypertensive chronic kidney disease with stage 1 through stage 4 chronic kidney disease, or unspecified chronic kidney disease: Secondary | ICD-10-CM | POA: Diagnosis not present

## 2017-08-27 DIAGNOSIS — N183 Chronic kidney disease, stage 3 (moderate): Secondary | ICD-10-CM | POA: Diagnosis not present

## 2017-08-27 DIAGNOSIS — G4733 Obstructive sleep apnea (adult) (pediatric): Secondary | ICD-10-CM | POA: Diagnosis not present

## 2017-08-27 DIAGNOSIS — D631 Anemia in chronic kidney disease: Secondary | ICD-10-CM | POA: Diagnosis not present

## 2017-08-29 ENCOUNTER — Encounter: Payer: Self-pay | Admitting: Internal Medicine

## 2017-08-29 DIAGNOSIS — D519 Vitamin B12 deficiency anemia, unspecified: Secondary | ICD-10-CM | POA: Diagnosis not present

## 2017-08-29 DIAGNOSIS — I890 Lymphedema, not elsewhere classified: Secondary | ICD-10-CM | POA: Diagnosis not present

## 2017-08-29 DIAGNOSIS — E8770 Fluid overload, unspecified: Secondary | ICD-10-CM | POA: Diagnosis not present

## 2017-08-29 DIAGNOSIS — I509 Heart failure, unspecified: Secondary | ICD-10-CM | POA: Diagnosis not present

## 2017-08-31 DIAGNOSIS — M6281 Muscle weakness (generalized): Secondary | ICD-10-CM | POA: Diagnosis not present

## 2017-09-06 DIAGNOSIS — M6281 Muscle weakness (generalized): Secondary | ICD-10-CM | POA: Diagnosis not present

## 2017-09-07 ENCOUNTER — Other Ambulatory Visit: Payer: Self-pay | Admitting: Endocrinology

## 2017-09-07 DIAGNOSIS — N183 Chronic kidney disease, stage 3 unspecified: Secondary | ICD-10-CM | POA: Diagnosis present

## 2017-09-07 DIAGNOSIS — M6281 Muscle weakness (generalized): Secondary | ICD-10-CM | POA: Diagnosis not present

## 2017-09-07 DIAGNOSIS — N179 Acute kidney failure, unspecified: Secondary | ICD-10-CM | POA: Diagnosis present

## 2017-09-10 DIAGNOSIS — M6281 Muscle weakness (generalized): Secondary | ICD-10-CM | POA: Diagnosis not present

## 2017-09-12 DIAGNOSIS — M6281 Muscle weakness (generalized): Secondary | ICD-10-CM | POA: Diagnosis not present

## 2017-09-13 ENCOUNTER — Ambulatory Visit (INDEPENDENT_AMBULATORY_CARE_PROVIDER_SITE_OTHER): Payer: Medicare Other | Admitting: Internal Medicine

## 2017-09-13 ENCOUNTER — Encounter: Payer: Self-pay | Admitting: Internal Medicine

## 2017-09-13 VITALS — BP 127/75 | HR 79 | Ht 67.0 in | Wt 313.8 lb

## 2017-09-13 DIAGNOSIS — Z9889 Other specified postprocedural states: Secondary | ICD-10-CM | POA: Diagnosis not present

## 2017-09-13 DIAGNOSIS — I4891 Unspecified atrial fibrillation: Secondary | ICD-10-CM

## 2017-09-13 DIAGNOSIS — I5032 Chronic diastolic (congestive) heart failure: Secondary | ICD-10-CM

## 2017-09-13 NOTE — Progress Notes (Signed)
OFFICE NOTE  Chief Complaint:  Follow-up cardioversion  Primary Care Physician: [name], MD  HPI:  Jillian Eaton is a 67 year old female I saw a few weeks ago with a history of super morbid obesity, fibromyalgia and increasing shortness of breath. She had a cath in 2012 which showed normal coronaries, mildly elevating filling pressures, and diastolic pressures with a mean PA of 31. This correlated with her echocardiogram when RV systolic pressures in 3500 were 49 on echo. A repeat echocardiogram was just performed which demonstrated a preserved LVEF of 60-65% with moderate concentric LVH. There was mild to moderate increase in pulmonary artery pressure with peak at 51, which has not significantly changed from her study 1 year ago. Although the pressures look very similar her shortness of breath has been increasing significantly, and I recommended that she wear oxygen at night since she had that at home which does seem to be helping her at least feel better during the day as I suspect she has sleep apnea. She has been tested before which was apparently negative and is considering retesting at some point in the future. Overall I think the main issue obviously is weight, and she unfortunately says that she is not a candidate for gastric bypass and therefore it is a very difficult situation.  I referred her back to her pulmonologist in [facility] for ongoing evaluation of pulmonary hypertension. She tells me that she was then referred to [facility] and saw a specialist there who did another right heart catheterization but did not recommend any medications other than better blood pressure control.   In addition she had problems with kidney stones and underwent 2 operations regarding tthis.  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  She is now dependent on thyroid medication. Unfortunately she's not been able to lose weight, but her weight is fairly  stable around 400 pounds as is her shortness of breath.  Cath in 2012:  LEFT HEART CATHETERIZATION  OPERATOR: [place] Drisana Schweickert, MD, and Rolland Porter, MD  INDICATION: Dyspnea on exertion and chest pressure.  HISTORY OF PRESENT ILLNESS: Jillian Eaton is a morbidly obese (BMI greater than 64) female with a history of failed gastric bypass x2 with an increasing weight gain and increasing shortness of breath as well as new- onset dyspnea on exertion. She reports that she can only walk about 10 feet before she becomes short of breath and has chest pressure which she says get better with rest. She has numerous cardiac risk factors and given the high likelihood of false positive stress test, I have referred her for cardiac catheterization, both left and right heart as she has had elevated RVSP on echocardiogram of approximately 30 to 31 mmHg.  PROCEDURE: The patient was brought into the cardiac catheterization lab, sterilely prepped and draped in the usual fashion. The area around the right femoral artery and right brachial vein were cleansed and draped to allow an attempt at radial arterial and brachiocephalic venous access. IV was not able to be obtained prior to the procedure given her body habitus. With difficulty in assessing the vein, the ultrasound was eventually used to identify the right brachiocephalic vein, however, cannulation with needle and wire was not possible as the vein was very small in caliber. After mild local bleeding was controlled, we did turn our attention to the right femoral vein and with great difficulty using the ultrasound, the right femoral vein was accessed by Dr.  Ellyn Hack using a straight wire and a needle. After venous access was obtained, the attention was turned to the right radial artery by Dr. Ellyn Hack and simultaneous to that right heart catheterization was performed by myself without any difficulty. The right radial artery was  successfully cannulated and subsequently left coronary artery system was selectively injected with a 5-French TIG 4.0 catheter, however the right coronary artery could not be cannulated with the TIG catheter and was eventually cannulated with a JR-4 catheter. LV pressure was measured with a pigtail catheter. Estimated blood loss was about 30 mL. There were no acute complications. The patient received a total of 9 mg of Versed throughout the procedure as well as 225 mcg of fentanyl and was at no point greater than moderately sedated. She received 5000 units of heparin about 10 mL of a radial cocktail.  FINDINGS: 1. Left main - short, no disease. 2. LAD - no significant disease. 3. Left circumflex, no significant disease. 4. RCA - dominant, no disease, large-caliber vessel. 5. LVEDP = 20 mmHg. 6. RA - 12. 7. RV 38/12. 8. PA - 43/19 (31). 9. PCWP - 24. 10.TPG - 7. 11.Fick cardiac output/Fick cardiac index - 10.56/3.84. 12.Thermodilution cardiac output/thermodilution cardiac index -  6.78/2.47. 13.Aortic saturation - 94%. 14.PA saturation - 68%.  IMPRESSION: 1. No significant obstructive coronary artery disease. 2. LVEDP = 20 mmHg. 3. Borderline pulmonary venous hypertension. 4. High cardiac output.  Jillian Eaton returns today for follow-up. She reports that her shortness of breath has not significantly worsened, in fact, possibly is slightly better. She did have thyroid surgery last year at [facility] and apparently underwent right heart catheterization prior to that. I do not have those records immediately available. Unfortunately she continues to maintain her weight and has not been able to lose any. She is complaining of some numbness and tingling in her feet which is likely neuropathy. She is on medication including Lyrica which she takes for fibromyalgia.  Jillian Eaton returns today for follow-up. She recently is been having more shortness of breath  and lower extremity swelling. She pointed out edema in her legs with very small blisters and some chronic venous stasis changes. She is not currently on a diuretic. She recently saw another new primary care provider with the [facility]. She was noted to be started on lisinopril 40 mg daily, which she is taking in addition to losartan 100 mg daily. The notes do not indicate from her office visit why she was started on lisinopril, but I can see through care everywhere that this was ordered by her primary care provider. She also takes amlodipine for blood pressure control. Her blood pressure was elevated initially at 178/94, but after resting came down to 120/78 and is at goal today. It is unusual, however to use both ACE inhibitors and ARBs.  07/02/2015  Jillian Eaton returns today for follow-up. She underwent a repeat echocardiogram for progressive dyspnea and exertion which showed a preserved LVEF of 6065% however there is moderate to severe pulmonary hypertension with an RVSP of 64 mmHg. This is increased about 10 mmHg compared to her prior study. Her mean pulmonary pressure by catheterization in 2012 was only 31 mmHg. I suspect that progressive pulmonary hypertension as a cause of her worsening shortness of breath. In fact, during recent hospitalization for surgery she required discharged with oxygen. She says she rarely uses oxygen at home but notes that she is hypoxic often when she checks her oxygen  levels. At her last office visit I recommended Lasix which she has been using sparingly. She has problems with incontinence and she does report an improvement in her swelling with it but does not notice significant change in her shortness of breath.  06/06/2016  Jillian Eaton returns from hospital follow-up. She was just admitted after an admission in January for unintentional narcotic overdose. She had unresponsiveness and possible aspiration. There was a second admission for a similar presentation  with respiratory failure, fever, sepsis and ultimately she was found to have a new onset cardiomyopathy with EF as low as 25%. Was felt that this was nonischemic. She was having intermittent atrial tachycardia and was felt that this might need to be managed with antiarrhythmic therapy however it seems to have resolved with carvedilol. She was placed on diuretics and reports that her breathing is close to baseline. She remains hypoxic with an O2 saturation 93% however was on home oxygen is result of her severe pulmonary hypertension. She does not feel that she needs to use the oxygen very regularly. From a heart failure standpoint she is on carvedilol, aspirin, furosemide and not currently on an ACE inhibitor, ARB or Entresto. Recent testing a renal function shows normal creatinine.  10/11/2016  Jillian Eaton is seen today in follow-up. In July she was hospitalized for hyperkalemia. She is on supplemental potassium and lisinopril, both were stopped. Fortunately her echo has improved back to normal recently. Overall she feels well. Her weight is down about 40 pounds of the recently she gained about 20 pounds back. She is working on that right as we speak.  04/03/2017  Jillian Eaton was seen today in follow-up.  She recently called in and had complaints of worsening shortness of breath.  She saw her pulmonary doctor who did an x-ray noted a mild right upper lobe infiltrate versus edema.  Labs were obtained including BNP which was only mildly elevated at 110.  She was given 20 mg of Lasix with no benefit and then recently was advised over the phone to increase her Lasix to 40 mg for 2 days.  She urinated quite a bit and lost about 5 pounds of what she feels was water weight.  She reports mild improvement in her breathing.  She is quite anxious about what might have led to this fluid gain.  Given her history of cardiomyopathy, it is possible she could have had some recurrent cardiomyopathy.  She reports stable diet and  no worsening abuse of sodium.  06/19/2017  Jillian Eaton returns today for follow-up.  She was supposed to have a limited echocardiogram but that never happened.  Approximately 2 to 3 weeks ago she called the office reporting palpitations.  We try to schedule an earlier appointment, but she ended up going to [facility] her [facility].  On the flight she apparently became short of breath had some left flank pain and was hypoxic.  She was given oxygen and after landing they took her to the [facility] in [place].  She was treated there and found to be in A. fib with RVR.  This is a new finding.  She had a remote history of either PAT or PAF, but so brief that she was not anticoagulated.  She was then started on Xarelto.  She tells me she was given Lasix and diuresed.  She returns today for follow-up and still reports some shortness of breath.  Weight is about 6 pounds heavier than she was 2 months ago.  She is in A. fib persistently with heart rates in the low 100s.  In addition she reports after starting the Xarelto that she has been having some vaginal bleeding.  07/26/2017  Jillian Eaton returns today for follow-up.  She was recently seen in the hospital and discharged on 07/17/2017.  Today is a transition of care follow-up.  She was contacted on the day after discharge and felt to be doing well.  She is currently in rehabilitation.  She says she has had marked improvement in her shortness of breath.  She feels like her edema has improved.  She has decreased her dose of daily Lasix from 80 mg to 40 mg.  She is in persistent atrial fibrillation however rate controlled on amiodarone 400 mg which she is taking twice daily.  She reports she is getting stronger and hopefully will be out of rehabilitation within the next week.  She did miss 1 dose of Eliquis due to a transfer issue on May 28 and therefore will need 3 weeks of subsequent uninterrupted anticoagulation prior to attempted  cardioversion.  09/13/2017  Jillian Eaton returns today for follow-up of cardioversion.  Unfortunately this was unsuccessful after 3 shocks.  She told me there is some confusion afterwards that she forgot to take her amiodarone.  She was taking it up until the cardioversion.  Since it was unsuccessful, I recommended rate control strategy.  At this point there is little benefit from being on amiodarone and the side effect profile would not make it favorable.  I would recommend just continuing carvedilol for rate control.  She is had significant weight loss now down to 313 from 340.  This will continue to help her symptoms.  Her shortness of breath has improved.  She has had no significant worsening edema.  No further significant GU bleeding.  PMHx:  Past Medical History:  Diagnosis Date  . Anemia   . Asthma   . Chronic headache   . DOE (dyspnea on exertion)    2D ECHO, 02/12/2012 - EF 60-65%, moderate concentric hypertrophy  . Fibromyalgia    nerve pain"left side at waist level" "can't lay on that side without pain" , "HOB elevation helps"  . Hematuria - cause not known   . History of kidney stones    x 2 '13, '14 surgery to remove  . Hypertension   . SBO (small bowel obstruction) ([place]) 06/07/2013  . Thyroid disease    "goiter"  . Transfusion history    10 yrs+    Past Surgical History:  Procedure Laterality Date  . CARDIAC CATHETERIZATION  04/04/2010   No significant obstructive coronary artery disease  . CARDIOVERSION N/A 08/08/2017   Procedure: CARDIOVERSION;  Surgeon: Pixie Casino, MD;  Location: [facility];  Service: Cardiovascular;  Laterality: N/A;  . CHOLECYSTECTOMY  1990  . COLONOSCOPY W/ POLYPECTOMY    . COLONOSCOPY WITH PROPOFOL N/A 04/10/2014   Procedure: COLONOSCOPY WITH PROPOFOL;  Surgeon: Beryle Beams, MD;  Location: [facility];  Service: Endoscopy;  Laterality: N/A;  . DIAGNOSTIC LAPAROSCOPY     x2 bowel obstructions(adhesions)  . 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 THYROIDECTOMY    . TUBAL LIGATION  1986    FAMHx:  Family History  Problem Relation Age of Onset  . Diabetes Mother   . Epilepsy Mother   . Cancer Mother        Breast  . Hypertension Mother   .  Breast cancer Mother   . Kidney disease Father   . Diabetes Father   . Hypertension Father   . Asthma Father   . Heart disease Father   . Epilepsy Sister   . Cancer Maternal Grandmother   . Breast cancer Maternal Grandmother   . Cancer Paternal Grandmother   . Breast cancer Paternal Grandmother     SOCHx:   reports that she has never smoked. She has never used smokeless tobacco. She reports that she does not drink alcohol or use drugs.  ALLERGIES:  Allergies  Allergen Reactions  . Norco [Hydrocodone-Acetaminophen] Itching    ROS: Pertinent items noted in HPI and remainder of comprehensive ROS otherwise negative.  HOME MEDS: Current Outpatient Medications  Medication Sig Dispense Refill  . acetaminophen (TYLENOL) 650 MG CR tablet Take 650 mg by mouth every 8 (eight) hours as needed for pain.     Marland Kitchen amiodarone (PACERONE) 400 MG tablet Take 400 mg daily 30 tablet 0  . apixaban (ELIQUIS) 5 MG TABS tablet Take 1 tablet (5 mg total) by mouth 2 (two) times daily. 60 tablet 6  . budesonide-formoterol (SYMBICORT) 160-4.5 MCG/ACT inhaler Inhale 2 puffs into the lungs 2 (two) times daily as needed (shortness of breath.).     Marland Kitchen calcitRIOL (ROCALTROL) 0.5 MCG capsule TAKE ONE CAPSULE BY MOUTH TWICE A DAY 60 capsule 0  . calcium carbonate (OS-CAL - DOSED IN MG OF ELEMENTAL CALCIUM) 1250 (500 Ca) MG tablet Take 1 tablet (1,250 mg total) by mouth 3 (three) times daily with meals. 90 tablet 0  . carvedilol (COREG) 3.125 MG tablet Take 1 tablet (3.125 mg total) by mouth 2 (two) times daily with a meal. 60 tablet 0  . DULoxetine (CYMBALTA) 60 MG capsule Take 1 capsule (60 mg total) by mouth daily. 30 capsule 0  . eszopiclone (LUNESTA) 2 MG TABS  tablet Take 2 mg by mouth at bedtime.  1  . furosemide (LASIX) 20 MG tablet Take 1 tablet by mouth every morning.    . hydrochlorothiazide (HYDRODIURIL) 25 MG tablet Take 1 tablet (25 mg total) by mouth daily. 90 tablet 3  . levothyroxine (SYNTHROID, LEVOTHROID) 200 MCG tablet Take 1 tablet (200 mcg total) by mouth daily before breakfast. 90 tablet 3  . LYRICA 200 MG capsule Take 200 mg by mouth 2 (two) times daily.   2  . meloxicam (MOBIC) 15 MG tablet Take 1 tablet by mouth daily as needed.    . mirabegron ER (MYRBETRIQ) 50 MG TB24 tablet Take 50 mg by mouth daily.    . OXYGEN Inhale 2 L/min into the lungs continuous.    Marland Kitchen senna (SENOKOT) 8.6 MG TABS tablet Take 1 tablet (8.6 mg total) by mouth daily. 120 each 0  . vitamin B-12 100 MCG tablet Take 1 tablet (100 mcg total) by mouth daily. 30 tablet 0  . Vitamin D, Ergocalciferol, (DRISDOL) 50000 units CAPS capsule Take 50,000 Units by mouth every 7 (seven) days.     No current facility-administered medications for this visit.     LABS/IMAGING: No results found for this or any previous visit (from the past 48 hour(s)). No results found.  VITALS: BP 127/75   Pulse 79   Ht 5\' 7"  (1.702 m)   Wt (!) 313 lb 12.8 oz (142.3 kg)   BMI 49.15 kg/m   EXAM: General appearance: alert, morbidly obese and in wheelchair Neck: no carotid bruit and no JVD Lungs: diminished breath sounds bilaterally Heart: regular rate and rhythm  Abdomen: soft, non-tender; bowel sounds normal; no masses,  no organomegaly Extremities: extremities normal, atraumatic, no cyanosis or edema Pulses: 2+ and symmetric Skin: Skin color, texture, turgor normal. No rashes or lesions Neurologic: Grossly normal Psych: Pleasant  EKG: A. fib at 79-personally reviewed  ASSESSMENT: 1. New onset A. fib with RVR-CHADSVASC score of 4 -failed cardioversion on amiodarone 2. Acute diastolic congestive heart failure-LVEF 25% (improved to 60-65% in 06/2016) 3. Super morbid obesity,  with failure of 3 gastric bypass procedures 4. Pulmonary hypertension - PA pressure of 64 mmHg, normal LV systolic function 5. Progressive DOE 6. Hypertension-controlled  7. Possible A. fib/ectopic atrial tachycardia-resolved  PLAN: 1.   Ms. Eaton had electrical cardioversion and amiodarone after 3 shocks.  There is no evidence of sinus rhythm.  This point I would recommend rate control as she is asymptomatic.  Hopefully LVEF will improve with rate control diuresis.  Weight is come down significantly.  We will plan to continue her current medications.  Stop amiodarone.  Follow-up with me in 3 months and consider repeat echo at that time.  Pixie Casino, MD, [facility], [place] Director of the [facility]  Card[facility] Diplomate of the American Board of Clinical Lipidology Attending Cardiologist  Direct Dial: [phone]  Fax: [phone]  Website:  [email or web address] 09/13/2017, 10:09 AM

## 2017-09-13 NOTE — Patient Instructions (Signed)
Your physician has recommended you make the following change in your medication: STOP amiodarone  Your physician recommends that you schedule a follow-up appointment in: 3 months with Dr. [name].

## 2017-09-14 DIAGNOSIS — M6281 Muscle weakness (generalized): Secondary | ICD-10-CM | POA: Diagnosis not present

## 2017-09-17 DIAGNOSIS — M6281 Muscle weakness (generalized): Secondary | ICD-10-CM | POA: Diagnosis not present

## 2017-09-18 ENCOUNTER — Emergency Department (HOSPITAL_COMMUNITY): Payer: Medicare Other

## 2017-09-18 ENCOUNTER — Encounter (HOSPITAL_COMMUNITY): Payer: Self-pay | Admitting: Internal Medicine

## 2017-09-18 ENCOUNTER — Emergency Department (HOSPITAL_COMMUNITY)
Admission: EM | Admit: 2017-09-18 | Discharge: 2017-09-18 | Disposition: A | Discharge status: Home / Self Care | Payer: Medicare Other | Attending: Emergency Medicine | Admitting: Emergency Medicine

## 2017-09-18 ENCOUNTER — Telehealth: Payer: Self-pay | Admitting: Cardiology

## 2017-09-18 DIAGNOSIS — R079 Chest pain, unspecified: Secondary | ICD-10-CM | POA: Diagnosis not present

## 2017-09-18 DIAGNOSIS — I110 Hypertensive heart disease with heart failure: Secondary | ICD-10-CM | POA: Diagnosis not present

## 2017-09-18 DIAGNOSIS — I5031 Acute diastolic (congestive) heart failure: Secondary | ICD-10-CM | POA: Insufficient documentation

## 2017-09-18 DIAGNOSIS — R110 Nausea: Secondary | ICD-10-CM | POA: Diagnosis not present

## 2017-09-18 DIAGNOSIS — J45909 Unspecified asthma, uncomplicated: Secondary | ICD-10-CM | POA: Insufficient documentation

## 2017-09-18 DIAGNOSIS — R0602 Shortness of breath: Secondary | ICD-10-CM | POA: Diagnosis not present

## 2017-09-18 DIAGNOSIS — Z79899 Other long term (current) drug therapy: Secondary | ICD-10-CM | POA: Insufficient documentation

## 2017-09-18 DIAGNOSIS — R0789 Other chest pain: Secondary | ICD-10-CM | POA: Diagnosis not present

## 2017-09-18 DIAGNOSIS — E039 Hypothyroidism, unspecified: Secondary | ICD-10-CM | POA: Diagnosis not present

## 2017-09-18 LAB — I-STAT TROPONIN, ED: Troponin i, poc: 0 ng/mL (ref 0.00–0.08)

## 2017-09-18 LAB — BASIC METABOLIC PANEL
Anion gap: 10 (ref 5–15)
BUN: 19 mg/dL (ref 8–23)
CO2: 27 mmol/L (ref 22–32)
Calcium: 8.5 mg/dL — ABNORMAL LOW (ref 8.9–10.3)
Chloride: 105 mmol/L (ref 98–111)
Creatinine, Ser: 1.21 mg/dL — ABNORMAL HIGH (ref 0.44–1.00)
GFR calc Af Amer: 53 mL/min — ABNORMAL LOW (ref >60)
GFR, EST NON AFRICAN AMERICAN: 46 mL/min — AB (ref >60)
GLUCOSE: 90 mg/dL (ref 70–99)
POTASSIUM: 3.8 mmol/L (ref 3.5–5.1)
Sodium: 142 mmol/L (ref 135–145)

## 2017-09-18 LAB — I-STAT VENOUS BLOOD GAS, ED
Acid-Base Excess: 4 mmol/L — ABNORMAL HIGH (ref 0.0–2.0)
BICARBONATE: 30.6 mmol/L — AB (ref 20.0–28.0)
O2 SAT: 38 %
PCO2 VEN: 55 mmHg (ref 44.0–60.0)
PO2 VEN: 24 mmHg — AB (ref 32.0–45.0)
TCO2: 32 mmol/L (ref 22–32)
pH, Ven: 7.353 (ref 7.250–7.430)

## 2017-09-18 LAB — CBC
HEMATOCRIT: 34.4 % — AB (ref 36.0–46.0)
Hemoglobin: 10.5 g/dL — ABNORMAL LOW (ref 12.0–15.0)
MCH: 27.5 pg (ref 26.0–34.0)
MCHC: 30.5 g/dL (ref 30.0–36.0)
MCV: 90.1 fL (ref 78.0–100.0)
Platelets: 142 10*3/uL — ABNORMAL LOW (ref 150–400)
RBC: 3.82 MIL/uL — ABNORMAL LOW (ref 3.87–5.11)
RDW: 19.4 % — ABNORMAL HIGH (ref 11.5–15.5)
WBC: 4.7 10*3/uL (ref 4.0–10.5)

## 2017-09-18 LAB — BRAIN NATRIURETIC PEPTIDE: B Natriuretic Peptide: 136.1 pg/mL — ABNORMAL HIGH (ref 0.0–100.0)

## 2017-09-18 NOTE — ED Notes (Signed)
Patient verbalizes understanding of discharge instructions. Opportunity for questioning and answers were provided. Armband removed by staff, pt discharged from ED.  

## 2017-09-18 NOTE — Discharge Instructions (Signed)
Follow-up with your primary care doctor in the next 2 to 4 days for further evaluation.  Return the emergency department for any difficulty breathing, chest pain, fevers, nausea/vomiting or any other worsening or concerning symptoms.

## 2017-09-18 NOTE — ED Notes (Addendum)
Pt states that her daughter will be bringing her home and that her daughter gets off work at [employer]. Pt verbalizes agreement to sit in the lobby to wait for daughter.

## 2017-09-18 NOTE — Telephone Encounter (Signed)
Spoke with patient and she has been having increased shortness of breath and decreased energy over the last 2 days. She has oxygen she uses as needed and has been having to use. Blood pressure 106/83 HR 74. Her weight is down to 303.6. When asked if she feels like she did when she was admitted to hospital in May she stated yes but not as bad with the decreased energy, does not want to go because they will probably want to keep her. Explained if she was feeling that bad she needed to go to ED but would discuss with DOD since Dr [name] out of the office. Discussed with Dr Stanford Breed and if she is feeling that bad he agreed with ED. Advised patient, verbalized understanding.

## 2017-09-18 NOTE — ED Provider Notes (Signed)
[facility] EMERGENCY DEPARTMENT Provider Note   CSN: [id number] Arrival date & time: 09/18/17  1426     History   Chief Complaint Chief Complaint  Patient presents with  . Chest Pain    HPI Jillian Eaton is a 67 y.o. female's medical history of anemia, asthma, dyspnea on exertion, fibromyalgia, A. fib who presents for evaluation of shortness of breath and chest pain that is been ongoing for last few days.  She reports that shortness of breath began approximately 2 days ago.  She states that she was getting short of breath at rest and with exertion.  She does report that she is on 2 L of O2 at home.  She states she is as needed but states she has had to use it more frequently over the last several days.  She has not had to increase the amount.  Additionally, patient reports she started developing some chest pain yesterday.  She states that the chest pain yesterday was intermittent and was from her sternal region and radiated to the left side.  Patient reports that today, the chest pain became more constant and feels like a pressure on the left side.  She describes it as a "small child sitting on my chest."  Patient states that the chest pain is worse with exertion.  She did get diaphoretic with the pain.  She states that it is was not worse with deep inspiration.  Patient states she did not take any medication for the pain.  Patient reports she called EMS today and the symptoms are worsening.  Patient does have a history of A. fib and is on Eliquis.  She reports she has been taking Eliquis.  She should previously been on amiodarone but states that she was taken off of that 5 days ago because it was not working.  Patient also reports that she has had some nausea and reports some weight loss over the last few days.  Patient states that she has not really had an appetite to eat.  Denies any vomiting.  She reports some generalized abdominal discomfort.  Patient denies any fever,  leg swelling, cough, urinary complaints, PND.  denies any OCP use, recent immobilization, prior history of DVT/PE, recent surgery, leg swelling, or long travel.  The history is provided by the patient.    Past Medical History:  Diagnosis Date  . Anemia   . Asthma   . Chronic headache   . DOE (dyspnea on exertion)    2D ECHO, 02/12/2012 - EF 60-65%, moderate concentric hypertrophy  . Fibromyalgia    nerve pain"left side at waist level" "can't lay on that side without pain" , "HOB elevation helps"  . Hematuria - cause not known   . History of kidney stones    x 2 '13, '14 surgery to remove  . Hypertension   . SBO (small bowel obstruction) ([place]) 06/07/2013  . Thyroid disease    "goiter"  . Transfusion history    10 yrs+    Patient Active Problem List   Diagnosis Date Noted  . 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 Hypercapnemia 07/13/2017  . AKI (acute kidney injury) ([place]) 07/10/2017  . On anticoagulant therapy 06/19/2017  . GERD (gastroesophageal reflux disease) 08/28/2016  . 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 Depression 08/28/2016  . Iatrogenic hypocalcemia 08/28/2016  . Chronic pain syndrome   . Fibromyalgia   . Asthma   . Acute diastolic CHF (congestive heart failure) ([place])   .  Atrial fibrillation ([place]) 04/28/2016  . Vocal cord dysfunction 04/28/2016  . Thrombocytopenia ([place]) 04/28/2016  . Seizures ([place])   . Leg swelling 10/19/2015  . Acute on chronic respiratory failure with hypoxia and hypercapnia ([place]) 08/03/2015  . Bilateral leg edema 05/11/2015  . Essential hypertension 05/11/2015  . Pulmonary hypertension ([place]) 01/08/2013  . Anemia of chronic disease 03/28/2011  . Morbid obesity ([place]) 09/22/2010    Past Surgical History:  Procedure Laterality Date  . CARDIAC CATHETERIZATION  04/04/2010   No significant obstructive coronary artery disease  . CARDIOVERSION N/A 08/08/2017   Procedure: CARDIOVERSION;  Surgeon: Pixie Casino, MD;   Location: [facility];  Service: Cardiovascular;  Laterality: N/A;  . CHOLECYSTECTOMY  1990  . COLONOSCOPY W/ POLYPECTOMY    . COLONOSCOPY WITH PROPOFOL N/A 04/10/2014   Procedure: COLONOSCOPY WITH PROPOFOL;  Surgeon: Beryle Beams, MD;  Location: [facility];  Service: Endoscopy;  Laterality: N/A;  . DIAGNOSTIC LAPAROSCOPY     x2 bowel obstructions(adhesions)  . 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 THYROIDECTOMY    . TUBAL LIGATION  1986     OB History   None      Home Medications    Prior to Admission medications   Medication Sig Start Date End Date Taking? Authorizing Provider  acetaminophen (TYLENOL) 650 MG CR tablet Take 1,300 mg by mouth daily.    Yes [provider]  apixaban (ELIQUIS) 5 MG TABS tablet Take 1 tablet (5 mg total) by mouth 2 (two) times daily. 06/21/17  Yes Hilty, Nadean Corwin, MD  budesonide-formoterol (SYMBICORT) 160-4.5 MCG/ACT inhaler Inhale 2 puffs into the lungs 2 (two) times daily as needed (shortness of breath.).    Yes Tanda Rockers, MD  calcitRIOL (ROCALTROL) 0.5 MCG capsule TAKE ONE CAPSULE BY MOUTH TWICE A DAY 09/07/17  Yes Elayne Snare, MD  calcium carbonate (OS-CAL - DOSED IN MG OF ELEMENTAL CALCIUM) 1250 (500 Ca) MG tablet Take 1 tablet (1,250 mg total) by mouth 3 (three) times daily with meals. Patient taking differently: Take 1,250 mg by mouth 3 (three) times daily.  07/17/17  Yes Regalado, Belkys A, MD  carvedilol (COREG) 3.125 MG tablet Take 1 tablet (3.125 mg total) by mouth 2 (two) times daily with a meal. 07/17/17  Yes Regalado, Belkys A, MD  DULoxetine (CYMBALTA) 60 MG capsule Take 1 capsule (60 mg total) by mouth daily. 07/17/17  Yes Regalado, Belkys A, MD  eszopiclone (LUNESTA) 2 MG TABS tablet Take 2 mg by mouth at bedtime. 07/03/17  Yes [provider]  furosemide (LASIX) 20 MG tablet Take 20 mg by mouth every morning.  09/11/17  Yes [provider]  hydrochlorothiazide  (HYDRODIURIL) 25 MG tablet Take 1 tablet (25 mg total) by mouth daily. 08/09/17  Yes Philemon Kingdom, MD  levothyroxine (SYNTHROID, LEVOTHROID) 200 MCG tablet Take 1 tablet (200 mcg total) by mouth daily before breakfast. 06/21/17  Yes Hilty, Nadean Corwin, MD  LYRICA 200 MG capsule Take 200 mg by mouth 2 (two) times daily.  08/09/16  Yes [provider]  magnesium oxide (MAG-OX) 400 (241.3 Mg) MG tablet Take 400 mg by mouth daily. 08/16/17  Yes [provider]  meloxicam (MOBIC) 15 MG tablet Take 15 mg by mouth daily as needed (pain).  07/18/17  Yes [provider]  methocarbamol (ROBAXIN) 500 MG tablet Take 500 mg by mouth at bedtime. 09/05/17  Yes [provider]  mirabegron ER (  MYRBETRIQ) 50 MG TB24 tablet Take 50 mg by mouth daily.   Yes [provider]  OXYGEN Inhale 2 L/min into the lungs as needed (shortness of breath).    Yes [provider]  traZODone (DESYREL) 50 MG tablet Take 50 mg by mouth 2 (two) times daily. 09/05/17  Yes [provider]  Vitamin D, Ergocalciferol, (DRISDOL) 50000 units CAPS capsule Take 50,000 Units by mouth every 7 (seven) days.   Yes [provider]  senna (SENOKOT) 8.6 MG TABS tablet Take 1 tablet (8.6 mg total) by mouth daily. Patient not taking: Reported on 09/18/2017 07/17/17   Niel Hummer A, MD  vitamin B-12 100 MCG tablet Take 1 tablet (100 mcg total) by mouth daily. Patient not taking: Reported on 09/18/2017 07/17/17   Elmarie Shiley, MD    Family History Family History  Problem Relation Age of Onset  . Diabetes Mother   . Epilepsy Mother   . Cancer Mother        Breast  . Hypertension Mother   . Breast cancer Mother   . Kidney disease Father   . Diabetes Father   . Hypertension Father   . Asthma Father   . Heart disease Father   . Epilepsy Sister   . Cancer Maternal Grandmother   . Breast cancer Maternal Grandmother   . Cancer Paternal Grandmother   . Breast cancer Paternal  Grandmother     Social History Social History   Tobacco Use  . Smoking status: Never Smoker  . Smokeless tobacco: Never Used  Substance Use Topics  . Alcohol use: No    Comment: wine occ  . Drug use: No    Types: Oxycodone    Comment: perscribed     Allergies   Norco [hydrocodone-acetaminophen]   Review of Systems Review of Systems  Constitutional: Positive for appetite change. Negative for fever.  Respiratory: Positive for shortness of breath. Negative for cough.   Cardiovascular: Positive for chest pain.  Gastrointestinal: Positive for abdominal pain and nausea. Negative for vomiting.  Genitourinary: Negative for dysuria and hematuria.  Neurological: Negative for headaches.  All other systems reviewed and are negative.    Physical Exam Updated Vital Signs BP (!) 108/59   Pulse 68   Temp 97.6 F (36.4 C) (Oral)   Resp 20   Ht 5\' 7"  (1.702 m)   Wt (!) 142 kg (313 lb)   SpO2 94%   BMI 49.02 kg/m   Physical Exam  Constitutional: She is oriented to person, place, and time. She appears well-developed and well-nourished.  Appears uncomfortable but no acute distress   HENT:  Head: Normocephalic and atraumatic.  Mouth/Throat: Oropharynx is clear and moist and mucous membranes are normal.  Eyes: Pupils are equal, round, and reactive to light. Conjunctivae, EOM and lids are normal.  Neck: Full passive range of motion without pain.  Cardiovascular: Normal rate, regular rhythm, normal heart sounds and normal pulses. Exam reveals no gallop and no friction rub.  No murmur heard. Pulses:      Radial pulses are 2+ on the right side, and 2+ on the left side.       Dorsalis pedis pulses are 2+ on the right side, and 2+ on the left side.  Pulmonary/Chest: Effort normal and breath sounds normal.  Lungs clear to auscultation bilaterally.  Symmetric chest rise.  No wheezing, rales, rhonchi. Able to speak in full sentences without any difficulty  Abdominal: Soft. Normal  appearance. There is generalized tenderness.  There is no rigidity and no guarding.  Musculoskeletal: Normal range of motion.  BLE are symmetric in appearance. Mild pitting edema to the distal aspects.   Neurological: She is alert and oriented to person, place, and time.  Skin: Skin is warm and dry. Capillary refill takes less than 2 seconds.  Psychiatric: She has a normal mood and affect. Her speech is normal.  Nursing note and vitals reviewed.    ED Treatments / Results  Labs (all labs ordered are listed, but only abnormal results are displayed) Labs Reviewed  BASIC METABOLIC PANEL - Abnormal; Notable for the following components:      Result Value   Creatinine, Ser 1.21 (*)    Calcium 8.5 (*)    GFR calc non Af Amer 46 (*)    GFR calc Af Amer 53 (*)    All other components within normal limits  CBC - Abnormal; Notable for the following components:   RBC 3.82 (*)    Hemoglobin 10.5 (*)    HCT 34.4 (*)    RDW 19.4 (*)    Platelets 142 (*)    All other components within normal limits  BRAIN NATRIURETIC PEPTIDE - Abnormal; Notable for the following components:   B Natriuretic Peptide 136.1 (*)    All other components within normal limits  I-STAT VENOUS BLOOD GAS, ED - Abnormal; Notable for the following components:   pO2, Ven 24.0 (*)    Bicarbonate 30.6 (*)    Acid-Base Excess 4.0 (*)    All other components within normal limits  BLOOD GAS, VENOUS  I-STAT TROPONIN, ED    EKG EKG Interpretation  Date/Time:  Tuesday September 18 2017 14:31:24 EDT Ventricular Rate:  70 PR Interval:    QRS Duration: 100 QT Interval:  413 QTC Calculation: 446 R Axis:   80 Text Interpretation:  Atrial fibrillation Nonspecific T abnrm, anterolateral leads No significant change was found Confirmed by Jola Schmidt [phone]) on 09/18/2017 3:30:21 PM   Radiology Dg Chest 2 View  Result Date: 09/18/2017 CLINICAL DATA:  Chest pain EXAM: CHEST - 2 VIEW COMPARISON:  07/12/2017, 03/30/2017 FINDINGS:  Moderate-to-marked cardiomegaly with central vascular congestion. A new large central pulmonary arteries as before. No pleural effusion. No focal opacity. No pneumothorax. IMPRESSION: Similar degree of cardiomegaly with vascular congestion. Electronically Signed   By: Donavan Foil M.D.   On: 09/18/2017 15:46    Procedures Procedures (including critical care time)  Medications Ordered in ED Medications - No data to display   Initial Impression / Assessment and Plan / ED Course  I have reviewed the triage vital signs and the nursing notes.  Pertinent labs & imaging results that were available during my care of the patient were reviewed by me and considered in my medical decision making (see chart for details).     67 year old female past medical history of anemia, A. fib, dyspnea on exertion presents for evaluation of shortness of breath and chest pain. Patient is afebrile, non-toxic appearing, sitting comfortably on examination table. Vital signs reviewed and stable.  Consider ACS, symptomatic anemia, infectious etiology versus A. Fib.  Do not suspect PE given history/physical exam. Iinitial labs and imaging ordered at triage.  There is some discrepancy in patient's story.  She told me that she is on oxygen as needed but she had told EMS that she is on it continuously.  Review of records does show that she is on continuous 2 L O2.  This is what she has been on  while here in the ED and is maintaining good O2 sats.  Troponin negative.  CBC without any significant leukocytosis.  Hemoglobin is 10.5, hematocrit 34.4.  BMP shows creatinine of 1.21.  BNP is 136.1.  Chest x-ray shows some mild degree of vascular congestion but otherwise no acute infectious process.   RN attempted to ambulate patient to check pulse ox.  Patient was concerned about walking.  She states that at home she normally ambulates with the assistance of a walker and is normally very unsteady on her feet.  RN stated that [facility]  was concerned about patient's living conditions as her living conditions seem very deteriorated.  Will get case management involved to see if they can help her with home health, PT.  She seemed reluctant she did not want it.  She states that she "has everything that she needs to take care of herself and does not want anybody coming into her house."   Reevaluation.  Patient states that she is not having any pain at this time.  She states shortness of breath has improved here in the ED.  Attempted to ambulate patient myself patient was concerned about walking without her walker given that she has some instability on her feet.  Patient does report improvement in symptoms.  Vital signs are stable.  Patient has maintained O2 sats greater than 94% on her baseline O2.  At this time, do not suspect ACS etiology to be the cause of patient's symptoms.  Her chest pain has been ongoing for greater than 24 hours and she has one negative troponin here in the ED.  Additionally, history/physical exam is not concerning for PE.  Patient is currently on blood thinners.  Encourage patient to follow-up with her primary care doctor and her cardiologist. Patient had ample opportunity for questions and discussion. All patient's questions were answered with full understanding. Strict return precautions discussed. Patient expresses understanding and agreement to plan.   Final Clinical Impressions(s) / ED Diagnoses   Final diagnoses:  SOB (shortness of breath)    ED Discharge Orders    None       Desma Mcgregor 09/19/17 Angela Cox, MD 09/21/17 1342

## 2017-09-18 NOTE — ED Triage Notes (Signed)
Pt here from home via [facility] c/o substernal chest pain x2 days. Denies nausea, vomiting, shortness of breath. Given 324 aspirin by EMS. Pt refused to let EMS gain IV access.Per EMS, CP 4/10 both before and after aspirin.

## 2017-09-18 NOTE — ED Notes (Addendum)
Patient transported to X-ray 

## 2017-09-18 NOTE — ED Notes (Signed)
ED Provider at bedside. 

## 2017-09-19 DIAGNOSIS — D519 Vitamin B12 deficiency anemia, unspecified: Secondary | ICD-10-CM | POA: Diagnosis not present

## 2017-09-19 DIAGNOSIS — I10 Essential (primary) hypertension: Secondary | ICD-10-CM | POA: Diagnosis not present

## 2017-09-19 DIAGNOSIS — I509 Heart failure, unspecified: Secondary | ICD-10-CM | POA: Diagnosis not present

## 2017-09-19 DIAGNOSIS — I482 Chronic atrial fibrillation: Secondary | ICD-10-CM | POA: Diagnosis not present

## 2017-09-19 DIAGNOSIS — R0609 Other forms of dyspnea: Secondary | ICD-10-CM | POA: Diagnosis not present

## 2017-09-21 ENCOUNTER — Emergency Department (HOSPITAL_COMMUNITY)
Admission: EM | Admit: 2017-09-21 | Discharge: 2017-09-22 | Disposition: A | Discharge status: Home / Self Care | Payer: Medicare Other | Attending: Emergency Medicine | Admitting: Emergency Medicine

## 2017-09-21 ENCOUNTER — Emergency Department (HOSPITAL_COMMUNITY): Payer: Medicare Other

## 2017-09-21 ENCOUNTER — Other Ambulatory Visit: Payer: Self-pay

## 2017-09-21 ENCOUNTER — Telehealth: Payer: Self-pay | Admitting: Internal Medicine

## 2017-09-21 ENCOUNTER — Encounter (HOSPITAL_COMMUNITY): Payer: Self-pay | Admitting: *Deleted

## 2017-09-21 DIAGNOSIS — R0602 Shortness of breath: Secondary | ICD-10-CM | POA: Diagnosis not present

## 2017-09-21 DIAGNOSIS — I5031 Acute diastolic (congestive) heart failure: Secondary | ICD-10-CM | POA: Diagnosis not present

## 2017-09-21 DIAGNOSIS — R0902 Hypoxemia: Secondary | ICD-10-CM | POA: Diagnosis not present

## 2017-09-21 DIAGNOSIS — Z7901 Long term (current) use of anticoagulants: Secondary | ICD-10-CM | POA: Insufficient documentation

## 2017-09-21 DIAGNOSIS — Z79899 Other long term (current) drug therapy: Secondary | ICD-10-CM | POA: Diagnosis not present

## 2017-09-21 DIAGNOSIS — J45909 Unspecified asthma, uncomplicated: Secondary | ICD-10-CM | POA: Insufficient documentation

## 2017-09-21 DIAGNOSIS — R0689 Other abnormalities of breathing: Secondary | ICD-10-CM | POA: Diagnosis not present

## 2017-09-21 DIAGNOSIS — R61 Generalized hyperhidrosis: Secondary | ICD-10-CM | POA: Diagnosis not present

## 2017-09-21 DIAGNOSIS — I110 Hypertensive heart disease with heart failure: Secondary | ICD-10-CM | POA: Diagnosis not present

## 2017-09-21 DIAGNOSIS — I4891 Unspecified atrial fibrillation: Secondary | ICD-10-CM | POA: Diagnosis not present

## 2017-09-21 HISTORY — DX: Heart failure, unspecified: I50.9

## 2017-09-21 LAB — BASIC METABOLIC PANEL
ANION GAP: 16 — AB (ref 5–15)
BUN: 27 mg/dL — ABNORMAL HIGH (ref 8–23)
CHLORIDE: 99 mmol/L (ref 98–111)
CO2: 26 mmol/L (ref 22–32)
Calcium: 8.6 mg/dL — ABNORMAL LOW (ref 8.9–10.3)
Creatinine, Ser: 1.37 mg/dL — ABNORMAL HIGH (ref 0.44–1.00)
GFR calc Af Amer: 46 mL/min — ABNORMAL LOW (ref >60)
GFR, EST NON AFRICAN AMERICAN: 40 mL/min — AB (ref >60)
GLUCOSE: 85 mg/dL (ref 70–99)
POTASSIUM: 3.9 mmol/L (ref 3.5–5.1)
Sodium: 141 mmol/L (ref 135–145)

## 2017-09-21 LAB — HEPATIC FUNCTION PANEL
ALT: 12 U/L (ref 0–44)
AST: 19 U/L (ref 15–41)
Albumin: 3.7 g/dL (ref 3.5–5.0)
Alkaline Phosphatase: 77 U/L (ref 38–126)
BILIRUBIN DIRECT: 0.2 mg/dL (ref 0.0–0.2)
Indirect Bilirubin: 0.5 mg/dL (ref 0.3–0.9)
Total Bilirubin: 0.7 mg/dL (ref 0.3–1.2)
Total Protein: 7 g/dL (ref 6.5–8.1)

## 2017-09-21 LAB — TROPONIN I
Troponin I: <0.03 ng/mL (ref <0.03)
Troponin I: <0.03 ng/mL (ref <0.03)

## 2017-09-21 LAB — CBC WITH DIFFERENTIAL/PLATELET
ABS IMMATURE GRANULOCYTES: 0 10*3/uL (ref 0.0–0.1)
Basophils Absolute: 0.1 10*3/uL (ref 0.0–0.1)
Basophils Relative: 1 %
Eosinophils Absolute: 0.1 10*3/uL (ref 0.0–0.7)
Eosinophils Relative: 3 %
HEMATOCRIT: 36.4 % (ref 36.0–46.0)
HEMOGLOBIN: 11.1 g/dL — AB (ref 12.0–15.0)
Immature Granulocytes: 0 %
Lymphocytes Relative: 22 %
Lymphs Abs: 1.1 10*3/uL (ref 0.7–4.0)
MCH: 27.2 pg (ref 26.0–34.0)
MCHC: 30.5 g/dL (ref 30.0–36.0)
MCV: 89.2 fL (ref 78.0–100.0)
MONOS PCT: 13 %
Monocytes Absolute: 0.6 10*3/uL (ref 0.1–1.0)
Neutro Abs: 3 10*3/uL (ref 1.7–7.7)
Neutrophils Relative %: 61 %
PLATELETS: 202 10*3/uL (ref 150–400)
RBC: 4.08 MIL/uL (ref 3.87–5.11)
RDW: 19.1 % — ABNORMAL HIGH (ref 11.5–15.5)
WBC: 5.3 10*3/uL (ref 4.0–10.5)

## 2017-09-21 LAB — T4, FREE: Free T4: 2.87 ng/dL — ABNORMAL HIGH (ref 0.82–1.77)

## 2017-09-21 LAB — TSH: TSH: 0.034 u[IU]/mL — AB (ref 0.350–4.500)

## 2017-09-21 LAB — BRAIN NATRIURETIC PEPTIDE: B Natriuretic Peptide: 124.6 pg/mL — ABNORMAL HIGH (ref 0.0–100.0)

## 2017-09-21 MED ORDER — LACTATED RINGERS IV BOLUS
500.0000 mL | Freq: Once | INTRAVENOUS | Status: AC
  Administered 2017-09-21: 500 mL via INTRAVENOUS

## 2017-09-21 MED ORDER — IOPAMIDOL (ISOVUE-370) INJECTION 76%
100.0000 mL | Freq: Once | INTRAVENOUS | Status: AC | PRN
  Administered 2017-09-21: 100 mL via INTRAVENOUS

## 2017-09-21 MED ORDER — IOPAMIDOL (ISOVUE-370) INJECTION 76%
INTRAVENOUS | Status: AC
  Filled 2017-09-21: qty 100

## 2017-09-21 NOTE — Telephone Encounter (Signed)
Patient called in stating that she was seen in the ED on 7/30 for SOB, she has contacted her PCP and they advised her to call her Cardiologist. Patient does wear 2Lof O2 at home, but while on the phone she continued to have SOB, as well as current palpitations. She denied chest pains, and swelling, but states her BP has been 95/55 HR 92, and most recent on the phone with me BP was 101/77 HR 105.Unable to change any medications due to low BP. Patient does have Afib, and would more than likely benefit from possible Afib clinic visit, but for time being I advised patient that per our protocol symptomatic Afib is to go to ED. Patient advised to go to ED. I advised I would let Dr.Hilty know.

## 2017-09-21 NOTE — ED Notes (Signed)
Patient transported to CT 

## 2017-09-21 NOTE — ED Notes (Signed)
Assisted pt in ambulating to and from hallway bathroom. SpO2 >95% on RA throughout, though pt displayed fatigue and incr'd work of breathing while coming back from bathroom. Pt c/o SOB, being hot, and "needing oxygen" upon return to room. SpO2 98% at the time.

## 2017-09-21 NOTE — Telephone Encounter (Signed)
New message  Patient c/o Palpitations:  High priority if patient c/o lightheadedness, shortness of breath, or chest pain  1) How long have you had palpitations/irregular HR/ Afib? Are you having the symptoms now? 5 days, went to ED on 7/30  2) Are you currently experiencing lightheadedness, SOB or CP? SOB wears 2l o2  3) Do you have a history of afib (atrial fibrillation) or irregular heart rhythm? YES  Have you checked your BP or HR? (document readings if available): HR 92  4) Are you experiencing any other symptoms? sweating

## 2017-09-21 NOTE — ED Triage Notes (Signed)
Pt states sob and palpitations for several days.  Here via [facility].  Initially tachypneic, with difficulty speaking.  110/62, hr 70-90, rr 20, spo2 98% on 2L (per norm).  EKG showed afib (chronic).

## 2017-09-21 NOTE — ED Provider Notes (Signed)
[facility] DEPART[facility] Provider Note   CSN: [id number] Arrival date & time: 09/21/17  1724     History   Chief Complaint Chief Complaint  Patient presents with  . Shortness of Breath  . Palpitations    HPI ARIANE DITULLIO is a 67 y.o. female with an extensive medical history, including HFpEF, hypertension, thyroid disease, and atrial fibrillation who presents due to 1 week of progressively worsening shortness of breath and fatigue.  She says that she was initially seen in the ED on 7/30.  She says she was seen at that time for shortness of breath and chest pain.  She said no clear diagnosis was found.  Since then, she has continued to be short of breath but has not had any chest pain.  She was also seen by her primary physician after that.  Again, she says that no clear diagnosis for shortness of breath was found.  Today, she says that she was hypoxic at home with an SPO2 of 85% and that her blood pressure was low at 101/77.  She denies having any chest pain today.  She has no history of VTE.  She is anticoagulated on Eliquis.  She says that she sleeps on one pillow at night and that she has not noticed any worsening leg swelling recently.  She says this does not feel like her prior CHF exacerbations.  She does not think that she is wheezing.  She denies fever, cough, abdominal pain, and dysuria.  She does not have a history of known CAD.  On Thursday, she says that her cardiologist stopped her amiodarone for her atrial fibrillation.  She says that she does take other medications for this.  She denies cough.  HPI  Past Medical History:  Diagnosis Date  . Anemia   . Asthma   . CHF (congestive heart failure) ([place])   . Chronic headache   . DOE (dyspnea on exertion)    2D ECHO, 02/12/2012 - EF 60-65%, moderate concentric hypertrophy  . Fibromyalgia    nerve pain"left side at waist level" "can't lay on that side without pain" , "HOB elevation helps"  .  Hematuria - cause not known   . History of kidney stones    x 2 '13, '14 surgery to remove  . Hypertension   . SBO (small bowel obstruction) ([place]) 06/07/2013  . Thyroid disease    "goiter"  . Transfusion history    10 yrs+    Patient Active Problem List   Diagnosis Date Noted  . 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 Hypercapnemia 07/13/2017  . AKI (acute kidney injury) ([place]) 07/10/2017  . On anticoagulant therapy 06/19/2017  . GERD (gastroesophageal reflux disease) 08/28/2016  . 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 Depression 08/28/2016  . Iatrogenic hypocalcemia 08/28/2016  . Chronic pain syndrome   . Fibromyalgia   . Asthma   . Acute diastolic CHF (congestive heart failure) ([place])   . Atrial fibrillation ([place]) 04/28/2016  . Vocal cord dysfunction 04/28/2016  . Thrombocytopenia ([place]) 04/28/2016  . Seizures ([place])   . Leg swelling 10/19/2015  . Acute on chronic respiratory failure with hypoxia and hypercapnia ([place]) 08/03/2015  . Bilateral leg edema 05/11/2015  . Essential hypertension 05/11/2015  . Pulmonary hypertension ([place]) 01/08/2013  . Anemia of chronic disease 03/28/2011  . Morbid obesity ([place]) 09/22/2010    Past Surgical History:  Procedure Laterality Date  . CARDIAC CATHETERIZATION  04/04/2010   No significant obstructive coronary artery disease  .  CARDIOVERSION N/A 08/08/2017   Procedure: CARDIOVERSION;  Surgeon: Pixie Casino, MD;  Location: [facility];  Service: Cardiovascular;  Laterality: N/A;  . CHOLECYSTECTOMY  1990  . COLONOSCOPY W/ POLYPECTOMY    . COLONOSCOPY WITH PROPOFOL N/A 04/10/2014   Procedure: COLONOSCOPY WITH PROPOFOL;  Surgeon: Beryle Beams, MD;  Location: [facility];  Service: Endoscopy;  Laterality: N/A;  . DIAGNOSTIC LAPAROSCOPY     x2 bowel obstructions(adhesions)  . 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 THYROIDECTOMY    . TUBAL LIGATION  1986     OB History     None      Home Medications    Prior to Admission medications   Medication Sig Start Date End Date Taking? Authorizing Provider  acetaminophen (TYLENOL) 650 MG CR tablet Take 1,300 mg by mouth daily.     [provider]  apixaban (ELIQUIS) 5 MG TABS tablet Take 1 tablet (5 mg total) by mouth 2 (two) times daily. 06/21/17   Hilty, Nadean Corwin, MD  budesonide-formoterol (SYMBICORT) 160-4.5 MCG/ACT inhaler Inhale 2 puffs into the lungs 2 (two) times daily as needed (shortness of breath.).     Tanda Rockers, MD  calcitRIOL (ROCALTROL) 0.5 MCG capsule TAKE ONE CAPSULE BY MOUTH TWICE A DAY 09/07/17   Elayne Snare, MD  calcium carbonate (OS-CAL - DOSED IN MG OF ELEMENTAL CALCIUM) 1250 (500 Ca) MG tablet Take 1 tablet (1,250 mg total) by mouth 3 (three) times daily with meals. Patient taking differently: Take 1,250 mg by mouth 3 (three) times daily.  07/17/17   Regalado, Belkys A, MD  carvedilol (COREG) 3.125 MG tablet Take 1 tablet (3.125 mg total) by mouth 2 (two) times daily with a meal. 07/17/17   Regalado, Belkys A, MD  DULoxetine (CYMBALTA) 60 MG capsule Take 1 capsule (60 mg total) by mouth daily. 07/17/17   Regalado, Belkys A, MD  eszopiclone (LUNESTA) 2 MG TABS tablet Take 2 mg by mouth at bedtime. 07/03/17   [provider]  furosemide (LASIX) 20 MG tablet Take 20 mg by mouth every morning.  09/11/17   [provider]  hydrochlorothiazide (HYDRODIURIL) 25 MG tablet Take 1 tablet (25 mg total) by mouth daily. 08/09/17   Philemon Kingdom, MD  levothyroxine (SYNTHROID, LEVOTHROID) 200 MCG tablet Take 1 tablet (200 mcg total) by mouth daily before breakfast. 06/21/17   Hilty, Nadean Corwin, MD  LYRICA 200 MG capsule Take 200 mg by mouth 2 (two) times daily.  08/09/16   [provider]  magnesium oxide (MAG-OX) 400 (241.3 Mg) MG tablet Take 400 mg by mouth daily. 08/16/17   [provider]  meloxicam (MOBIC) 15 MG tablet Take 15 mg by mouth daily as needed (pain).   07/18/17   [provider]  methocarbamol (ROBAXIN) 500 MG tablet Take 500 mg by mouth at bedtime. 09/05/17   [provider]  mirabegron ER (MYRBETRIQ) 50 MG TB24 tablet Take 50 mg by mouth daily.    [provider]  OXYGEN Inhale 2 L/min into the lungs as needed (shortness of breath).     [provider]  senna (SENOKOT) 8.6 MG TABS tablet Take 1 tablet (8.6 mg total) by mouth daily. Patient not taking: Reported on 09/18/2017 07/17/17   Niel Hummer A, MD  traZODone (DESYREL) 50 MG tablet Take 50 mg by mouth 2 (two) times daily. 09/05/17   [provider]  vitamin B-12 100 MCG tablet  Take 1 tablet (100 mcg total) by mouth daily. Patient not taking: Reported on 09/18/2017 07/17/17   Regalado, Jerald Kief A, MD  Vitamin D, Ergocalciferol, (DRISDOL) 50000 units CAPS capsule Take 50,000 Units by mouth every 7 (seven) days.    [provider]    Family History Family History  Problem Relation Age of Onset  . Diabetes Mother   . Epilepsy Mother   . Cancer Mother        Breast  . Hypertension Mother   . Breast cancer Mother   . Kidney disease Father   . Diabetes Father   . Hypertension Father   . Asthma Father   . Heart disease Father   . Epilepsy Sister   . Cancer Maternal Grandmother   . Breast cancer Maternal Grandmother   . Cancer Paternal Grandmother   . Breast cancer Paternal Grandmother     Social History Social History   Tobacco Use  . Smoking status: Never Smoker  . Smokeless tobacco: Never Used  Substance Use Topics  . Alcohol use: No    Comment: wine occ  . Drug use: No    Types: Oxycodone    Comment: perscribed     Allergies   Norco [hydrocodone-acetaminophen]   Review of Systems Review of Systems Review of Systems   Constitutional  Negative for fever  Negative for chills  HENT  Negative for ear pain  Negative for sore throat  Negative for difficultly swallowing  Eyes  Negative for eye  pain  Negative for visual disturbance  Respiratory  +for shortness of breath  Negative for cough  CV  +for chest pain  Negative for leg swelling  Abdomen  Negative for abdominal pain  Negative for nausea  Negative for vomiting  MSK  Negative for extremity pain  Negative for back pain  Skin  Negative for rash  Negative for wound  Neuro  Negative for syncope  Negative for difficultly speaking  Psych  Negative for confusion   The remainder of the ROS was reviewed and negative except as documented above.      Physical Exam Updated Vital Signs BP 104/79   Pulse 73   Temp 98.4 F (36.9 C) (Oral)   Resp 16   Ht 5\' 7"  (1.702 m)   Wt (!) 142 kg (313 lb)   SpO2 100%   BMI 49.02 kg/m   Physical Exam Physical Exam Constitutional  Nursing notes reviewed  Vital signs reviewed  Obese  HEENT  No obvious trauma  Supple without meningismus, mass, or overt JVD  EOMI  No scleral icterus or injection  Respiratory  Effort normal  CTAB  No respiratory distress  Mildly dyspneic appearing  CV  Normal but irregular rate  No obvious murmurs  1+ pitting edema bilaterally  Equal pulses in all extremities  Chest not tender to palpation  Abdomen  Soft  Non-tender  Non-distended  No peritonitis  MSK  Atraumatic  No obvious deformity  ROM appropriate  Skin  Warm  Dry  Neuro  Awake and alert  EOMI  Moving all extremities  Denies numbness/tingling  Psychiatric  Mood and affect normal        ED Treatments / Results  Labs (all labs ordered are listed, but only abnormal results are displayed) Labs Reviewed  CBC WITH DIFFERENTIAL/PLATELET - Abnormal; Notable for the following components:      Result Value   Hemoglobin 11.1 (*)    RDW 19.1 (*)    All other components  within normal limits  BASIC METABOLIC PANEL - Abnormal; Notable for the following components:   BUN 27 (*)    Creatinine, Ser 1.37 (*)    Calcium 8.6 (*)    GFR  calc non Af Amer 40 (*)    GFR calc Af Amer 46 (*)    Anion gap 16 (*)    All other components within normal limits  BRAIN NATRIURETIC PEPTIDE - Abnormal; Notable for the following components:   B Natriuretic Peptide 124.6 (*)    All other components within normal limits  TSH - Abnormal; Notable for the following components:   TSH 0.034 (*)    All other components within normal limits  T4, FREE - Abnormal; Notable for the following components:   Free T4 2.87 (*)    All other components within normal limits  HEPATIC FUNCTION PANEL  TROPONIN I  TROPONIN I    EKG EKG Interpretation  Date/Time:  Friday September 21 2017 17:24:55 EDT Ventricular Rate:  77 PR Interval:    QRS Duration: 102 QT Interval:  399 QTC Calculation: 452 R Axis:   78 Text Interpretation:  Atrial fibrillation No significant change since last tracing Confirmed by Duffy Bruce [phone]) on 09/21/2017 5:35:57 PM  The ECG revealed:   Atrial fibrillation with a ventricular rate of 77.  QTc 452, QRS 78  No STEMI  No ST depressions  Non-specific T wave changes  No significant change from prior ECGs  There is no evidence of:   High-Grade Conduction Blocks   WPW   Long QT Syndrome   Significant LVH   Brugada Syndrome   Arrhythmogenic Right Ventricular Dysplasia   Wellens Waves   DeWinters T Waves   Right heart strain.  Radiology Ct Angio Chest Pe W And/or Wo Contrast  Result Date: 09/21/2017 CLINICAL DATA:  Heart palpitations and shortness of breath for the past 3 days. EXAM: CT ANGIOGRAPHY CHEST WITH CONTRAST TECHNIQUE: Multidetector CT imaging of the chest was performed using the standard protocol during bolus administration of intravenous contrast. Multiplanar CT image reconstructions and MIPs were obtained to evaluate the vascular anatomy. CONTRAST:  137mL ISOVUE-370 IOPAMIDOL (ISOVUE-370) INJECTION 76% COMPARISON:  Chest radiographs dated 09/18/2017. Chest CTA dated 08/18/2016. FINDINGS: Cardiovascular:  Normally opacified pulmonary arteries with no pulmonary arterial filling defects. The central pulmonary arteries remain enlarged. Enlarged heart. No pericardial effusion. Mediastinum/Nodes: No enlarged mediastinal, hilar, or axillary lymph nodes. Thyroid gland, trachea, and esophagus demonstrate no significant findings. Lungs/Pleura: Mild linear scarring at both lung bases. No pleural fluid. Upper Abdomen: Post gastric bypass changes. Cholecystectomy clips. 6 mm upper pole right renal calculus. Musculoskeletal: Thoracic spine degenerative changes and mild scoliosis. Review of the MIP images confirms the above findings. IMPRESSION: 1. No pulmonary emboli or acute abnormality. 2. Stable enlarged central pulmonary arteries suggesting pulmonary arterial hypertension. 3. 6 mm upper pole right renal calculus. Electronically Signed   By: Claudie Revering M.D.   On: 09/21/2017 21:00    Procedures Procedures (including critical care time)  Medications Ordered in ED Medications  iopamidol (ISOVUE-370) 76 % injection (has no administration in time range)  lactated ringers bolus 500 mL (500 mLs Intravenous New Bag/Given 09/21/17 2302)  iopamidol (ISOVUE-370) 76 % injection 100 mL (100 mLs Intravenous Contrast Given 09/21/17 2036)     Initial Impression / Assessment and Plan / ED Course  I have reviewed the triage vital signs and the nursing notes.  Pertinent labs & imaging results that were available during my care of the  patient were reviewed by me and considered in my medical decision making (see chart for details).  Clinical Course as of Aug 03 0001  Fri Sep 21, 2017  Chelsea presents with 1 week of shortness of breath and chest pain earlier this week as per above.  This is her second ED visit for the same complaints.  Additionally, she has been seen by her PCP for this.  No clear answer has been provided yet for her shortness of breath.She has significant medical comorbidities.  She is currently  hemodynamically stable and satting 100% on room air.  However, she says that she has been having to wear her home oxygen this week and that she has not worn it in years.  Given that she has now had multiple encounters for dyspnea with associated hypoxia and for chest pain earlier this week, I believe that ruling out pulmonary embolism is indicated.  CTA PE study has been ordered.  Additional, but less likely diagnoses, include ACS, aortic dissection, pneumonia, esophageal rupture, CHF exacerbation, and AAA.  Her presentation and physical exam are reassuring against these differentials.  To further evaluate her, a BNP, troponins, and screening labs will be obtained.  She reports that she stopped amiodarone last week at the request of her cardiologist.  Per extensive record review, her amiodarone was stopped after an unsuccessful cardioversion.  She is currently rate controlled and anticoagulated.  Her ECG shows no signs of acute ischemia.  Although she is in A. fib, I have a low suspicion that this is contributing to her current presentation.   [NA]  2240 She has no significant abnormalities on her CBC.  Her creatinine is 1.37 and her BUN is 27.  These are close to her baseline.  Her BNP is only mildly elevated at 124.  Again, this seems close to her baseline.  Her TSH is low, she takes thyroid medication.  Her free T4 is 2.9.  Her initial troponin is not detectable.  Repeat troponin pending.  She was given 500 cc of LR in the Ed.  CT PE study revealed no evidence of thromboembolic disease.  There is no evidence of acute lung disease.  There was an incidental right renal calculus.  I informed the patient of this finding.   [NA]  2331 The patient ambulated throughout the Ed.  She maintained her oxygen saturation greater than 95%.  Nursing did note that she seemed to have slightly increased work of breathing.  However, her oxygen saturation did not drop.  Her repeat troponin was not elevated.  I believe that  she is safe for discharge home.  She felt safe with this plan.  I encouraged her to followup with her cardiologist and pulmonologist.  She agreed with this plan.   [NA]    Clinical Course User Index [NA] Alford Highland, MD     Final Clinical Impressions(s) / ED Diagnoses   Final diagnoses:  None    ED Discharge Orders    None       Alford Highland, MD 09/22/17 Ofilia Neas    Duffy Bruce, MD 09/22/17 2347

## 2017-09-22 NOTE — Discharge Instructions (Addendum)
Trin[facility]:  Thank you for allowing [place] to take care of you today.  We hope you begin feeling better soon.  To-Do: Please follow-up with your Pulmonologist and Cardiologist Please return to the Emergency Department or call 911 if you experience chest pain, shortness of breath, severe pain, severe fever, altered mental status, or have any reason to think that you need emergency medical care.  Thank you again.  Hope you feel better soon.

## 2017-09-22 NOTE — ED Notes (Signed)
Pt departed in NAD.  

## 2017-09-24 DIAGNOSIS — K911 Postgastric surgery syndromes: Secondary | ICD-10-CM | POA: Diagnosis not present

## 2017-09-27 ENCOUNTER — Ambulatory Visit (HOSPITAL_COMMUNITY)
Admission: RE | Admit: 2017-09-27 | Discharge: 2017-09-27 | Disposition: A | Discharge status: Home / Self Care | Payer: Medicare Other | Source: Ambulatory Visit | Attending: Internal Medicine | Admitting: Internal Medicine

## 2017-09-27 DIAGNOSIS — D509 Iron deficiency anemia, unspecified: Secondary | ICD-10-CM | POA: Insufficient documentation

## 2017-09-27 MED ORDER — SODIUM CHLORIDE 0.9 % IV SOLN
INTRAVENOUS | Status: DC
  Administered 2017-09-27: 12:00:00 via INTRAVENOUS

## 2017-09-27 MED ORDER — IRON SUCROSE 20 MG/ML IV SOLN
200.0000 mg | Freq: Once | INTRAVENOUS | Status: AC
  Administered 2017-09-27: 200 mg via INTRAVENOUS
  Filled 2017-09-27: qty 10

## 2017-09-27 NOTE — Discharge Instructions (Signed)
Iron Sucrose injection What is this medicine? IRON SUCROSE (AHY ern SOO krohs) is an iron complex. Iron is used to make healthy red blood cells, which carry oxygen and nutrients throughout the body. This medicine is used to treat iron deficiency anemia in people with chronic kidney disease. This medicine may be used for other purposes; ask your health care provider or pharmacist if you have questions. COMMON BRAND NAME(S): Venofer What should I tell my health care provider before I take this medicine? They need to know if you have any of these conditions: -anemia not caused by low iron levels -heart disease -high levels of iron in the blood -kidney disease -liver disease -an unusual or allergic reaction to iron, other medicines, foods, dyes, or preservatives -pregnant or trying to get pregnant -breast-feeding How should I use this medicine? This medicine is for infusion into a vein. It is given by a health care professional in a hospital or clinic setting. Talk to your pediatrician regarding the use of this medicine in children. While this drug may be prescribed for children as young as 2 years for selected conditions, precautions do apply. Overdosage: If you think you have taken too much of this medicine contact a poison control center or emergency room at once. NOTE: This medicine is only for you. Do not share this medicine with others. What if I miss a dose? It is important not to miss your dose. Call your doctor or health care professional if you are unable to keep an appointment. What may interact with this medicine? Do not take this medicine with any of the following medications: -deferoxamine -dimercaprol -other iron products This medicine may also interact with the following medications: -chloramphenicol -deferasirox This list may not describe all possible interactions. Give your health care provider a list of all the medicines, herbs, non-prescription drugs, or dietary  supplements you use. Also tell them if you smoke, drink alcohol, or use illegal drugs. Some items may interact with your medicine. What should I watch for while using this medicine? Visit your doctor or healthcare professional regularly. Tell your doctor or healthcare professional if your symptoms do not start to get better or if they get worse. You may need blood work done while you are taking this medicine. You may need to follow a special diet. Talk to your doctor. Foods that contain iron include: whole grains/cereals, dried fruits, beans, or peas, leafy green vegetables, and organ meats (liver, kidney). What side effects may I notice from receiving this medicine? Side effects that you should report to your doctor or health care professional as soon as possible: -allergic reactions like skin rash, itching or hives, swelling of the face, lips, or tongue -breathing problems -changes in blood pressure -cough -fast, irregular heartbeat -feeling faint or lightheaded, falls -fever or chills -flushing, sweating, or hot feelings -joint or muscle aches/pains -seizures -swelling of the ankles or feet -unusually weak or tired Side effects that usually do not require medical attention (report to your doctor or health care professional if they continue or are bothersome): -diarrhea -feeling achy -headache -irritation at site where injected -nausea, vomiting -stomach upset -tiredness This list may not describe all possible side effects. Call your doctor for medical advice about side effects. You may report side effects to FDA at 1-800-FDA-1088. Where should I keep my medicine? This drug is given in a hospital or clinic and will not be stored at home. NOTE: This sheet is a summary. It may not cover all possible information. If   you have questions about this medicine, talk to your doctor, pharmacist, or health care provider.  2018 Elsevier/Gold Standard (2010-11-17 17:14:35)  

## 2017-09-28 ENCOUNTER — Ambulatory Visit (INDEPENDENT_AMBULATORY_CARE_PROVIDER_SITE_OTHER): Payer: Medicare Other | Admitting: Internal Medicine

## 2017-09-28 ENCOUNTER — Encounter: Payer: Self-pay | Admitting: Internal Medicine

## 2017-09-28 VITALS — BP 94/69 | HR 96 | Ht 65.0 in | Wt 306.0 lb

## 2017-09-28 DIAGNOSIS — I4891 Unspecified atrial fibrillation: Secondary | ICD-10-CM

## 2017-09-28 DIAGNOSIS — I5042 Chronic combined systolic (congestive) and diastolic (congestive) heart failure: Secondary | ICD-10-CM | POA: Insufficient documentation

## 2017-09-28 DIAGNOSIS — I5032 Chronic diastolic (congestive) heart failure: Secondary | ICD-10-CM

## 2017-09-28 DIAGNOSIS — E039 Hypothyroidism, unspecified: Secondary | ICD-10-CM

## 2017-09-28 NOTE — Patient Instructions (Signed)
Your physician has recommended you make the following change in your medication: STOP hydrochlorothiazide  Your physician recommends follow-up appointment as planned

## 2017-09-28 NOTE — Progress Notes (Signed)
OFFICE NOTE  Chief Complaint:  Follow-up ER visit  Primary Care Physician: [name], MD  HPI:  Jillian Eaton is a 67 year old female I saw a few weeks ago with a history of super morbid obesity, fibromyalgia and increasing shortness of breath. She had a cath in 2012 which showed normal coronaries, mildly elevating filling pressures, and diastolic pressures with a mean PA of 31. This correlated with her echocardiogram when RV systolic pressures in 7829 were 49 on echo. A repeat echocardiogram was just performed which demonstrated a preserved LVEF of 60-65% with moderate concentric LVH. There was mild to moderate increase in pulmonary artery pressure with peak at 51, which has not significantly changed from her study 1 year ago. Although the pressures look very similar her shortness of breath has been increasing significantly, and I recommended that she wear oxygen at night since she had that at home which does seem to be helping her at least feel better during the day as I suspect she has sleep apnea. She has been tested before which was apparently negative and is considering retesting at some point in the future. Overall I think the main issue obviously is weight, and she unfortunately says that she is not a candidate for gastric bypass and therefore it is a very difficult situation.  I referred her back to her pulmonologist in [facility] for ongoing evaluation of pulmonary hypertension. She tells me that she was then referred to [facility] and saw a specialist there who did another right heart catheterization but did not recommend any medications other than better blood pressure control.   In addition she had problems with kidney stones and underwent 2 operations regarding tthis.  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  She is now dependent on thyroid medication. Unfortunately she's not been able to lose weight, but her weight is fairly stable  around 400 pounds as is her shortness of breath.  Cath in 2012:  LEFT HEART CATHETERIZATION  OPERATOR: Mali Hilty, MD, and Rolland Porter, MD  INDICATION: Dyspnea on exertion and chest pressure.  HISTORY OF PRESENT ILLNESS: Jillian Eaton is a morbidly obese (BMI greater than 13) female with a history of failed gastric bypass x2 with an increasing weight gain and increasing shortness of breath as well as new- onset dyspnea on exertion. She reports that she can only walk about 10 feet before she becomes short of breath and has chest pressure which she says get better with rest. She has numerous cardiac risk factors and given the high likelihood of false positive stress test, I have referred her for cardiac catheterization, both left and right heart as she has had elevated RVSP on echocardiogram of approximately 30 to 31 mmHg.  PROCEDURE: The patient was brought into the cardiac catheterization lab, sterilely prepped and draped in the usual fashion. The area around the right femoral artery and right brachial vein were cleansed and draped to allow an attempt at radial arterial and brachiocephalic venous access. IV was not able to be obtained prior to the procedure given her body habitus. With difficulty in assessing the vein, the ultrasound was eventually used to identify the right brachiocephalic vein, however, cannulation with needle and wire was not possible as the vein was very small in caliber. After mild local bleeding was controlled, we did turn our attention to the right femoral vein and with great difficulty using the ultrasound, the right femoral vein was accessed by  Dr. Ellyn Hack using a straight wire and a needle. After venous access was obtained, the attention was turned to the right radial artery by Dr. Ellyn Hack and simultaneous to that right heart catheterization was performed by myself without any difficulty. The right radial artery was  successfully cannulated and subsequently left coronary artery system was selectively injected with a 5-French TIG 4.0 catheter, however the right coronary artery could not be cannulated with the TIG catheter and was eventually cannulated with a JR-4 catheter. LV pressure was measured with a pigtail catheter. Estimated blood loss was about 30 mL. There were no acute complications. The patient received a total of 9 mg of Versed throughout the procedure as well as 225 mcg of fentanyl and was at no point greater than moderately sedated. She received 5000 units of heparin about 10 mL of a radial cocktail.  FINDINGS: 1. Left main - short, no disease. 2. LAD - no significant disease. 3. Left circumflex, no significant disease. 4. RCA - dominant, no disease, large-caliber vessel. 5. LVEDP = 20 mmHg. 6. RA - 12. 7. RV 38/12. 8. PA - 43/19 (31). 9. PCWP - 24. 10.TPG - 7. 11.Fick cardiac output/Fick cardiac index - 10.56/3.84. 12.Thermodilution cardiac output/thermodilution cardiac index -  6.78/2.47. 13.Aortic saturation - 94%. 14.PA saturation - 68%.  IMPRESSION: 1. No significant obstructive coronary artery disease. 2. LVEDP = 20 mmHg. 3. Borderline pulmonary venous hypertension. 4. High cardiac output.  Jillian Eaton returns today for follow-up. She reports that her shortness of breath has not significantly worsened, in fact, possibly is slightly better. She did have thyroid surgery last year at [facility] and apparently underwent right heart catheterization prior to that. I do not have those records immediately available. Unfortunately she continues to maintain her weight and has not been able to lose any. She is complaining of some numbness and tingling in her feet which is likely neuropathy. She is on medication including Lyrica which she takes for fibromyalgia.  Jillian Eaton returns today for follow-up. She recently is been having more shortness of breath  and lower extremity swelling. She pointed out edema in her legs with very small blisters and some chronic venous stasis changes. She is not currently on a diuretic. She recently saw another new primary care provider with the [facility]. She was noted to be started on lisinopril 40 mg daily, which she is taking in addition to losartan 100 mg daily. The notes do not indicate from her office visit why she was started on lisinopril, but I can see through care everywhere that this was ordered by her primary care provider. She also takes amlodipine for blood pressure control. Her blood pressure was elevated initially at 178/94, but after resting came down to 120/78 and is at goal today. It is unusual, however to use both ACE inhibitors and ARBs.  07/02/2015  Jillian Eaton returns today for follow-up. She underwent a repeat echocardiogram for progressive dyspnea and exertion which showed a preserved LVEF of 6065% however there is moderate to severe pulmonary hypertension with an RVSP of 64 mmHg. This is increased about 10 mmHg compared to her prior study. Her mean pulmonary pressure by catheterization in 2012 was only 31 mmHg. I suspect that progressive pulmonary hypertension as a cause of her worsening shortness of breath. In fact, during recent hospitalization for surgery she required discharged with oxygen. She says she rarely uses oxygen at home but notes that she is hypoxic often when she checks her  oxygen levels. At her last office visit I recommended Lasix which she has been using sparingly. She has problems with incontinence and she does report an improvement in her swelling with it but does not notice significant change in her shortness of breath.  06/06/2016  Jillian Eaton returns from hospital follow-up. She was just admitted after an admission in January for unintentional narcotic overdose. She had unresponsiveness and possible aspiration. There was a second admission for a similar presentation  with respiratory failure, fever, sepsis and ultimately she was found to have a new onset cardiomyopathy with EF as low as 25%. Was felt that this was nonischemic. She was having intermittent atrial tachycardia and was felt that this might need to be managed with antiarrhythmic therapy however it seems to have resolved with carvedilol. She was placed on diuretics and reports that her breathing is close to baseline. She remains hypoxic with an O2 saturation 93% however was on home oxygen is result of her severe pulmonary hypertension. She does not feel that she needs to use the oxygen very regularly. From a heart failure standpoint she is on carvedilol, aspirin, furosemide and not currently on an ACE inhibitor, ARB or Entresto. Recent testing a renal function shows normal creatinine.  10/11/2016  Jillian Eaton is seen today in follow-up. In July she was hospitalized for hyperkalemia. She is on supplemental potassium and lisinopril, both were stopped. Fortunately her echo has improved back to normal recently. Overall she feels well. Her weight is down about 40 pounds of the recently she gained about 20 pounds back. She is working on that right as we speak.  04/03/2017  Jillian Eaton was seen today in follow-up.  She recently called in and had complaints of worsening shortness of breath.  She saw her pulmonary doctor who did an x-ray noted a mild right upper lobe infiltrate versus edema.  Labs were obtained including BNP which was only mildly elevated at 110.  She was given 20 mg of Lasix with no benefit and then recently was advised over the phone to increase her Lasix to 40 mg for 2 days.  She urinated quite a bit and lost about 5 pounds of what she feels was water weight.  She reports mild improvement in her breathing.  She is quite anxious about what might have led to this fluid gain.  Given her history of cardiomyopathy, it is possible she could have had some recurrent cardiomyopathy.  She reports stable diet and  no worsening abuse of sodium.  06/19/2017  Jillian Eaton returns today for follow-up.  She was supposed to have a limited echocardiogram but that never happened.  Approximately 2 to 3 weeks ago she called the office reporting palpitations.  We try to schedule an earlier appointment, but she ended up going to [facility] for her [facility].  On the flight she apparently became short of breath had some left flank pain and was hypoxic.  She was given oxygen and after landing they took her to the [facility] in [place].  She was treated there and found to be in A. fib with RVR.  This is a new finding.  She had a remote history of either PAT or PAF, but so brief that she was not anticoagulated.  She was then started on Xarelto.  She tells me she was given Lasix and diuresed.  She returns today for follow-up and still reports some shortness of breath.  Weight is about 6 pounds heavier than she was 2 months  ago.  She is in A. fib persistently with heart rates in the low 100s.  In addition she reports after starting the Xarelto that she has been having some vaginal bleeding.  07/26/2017  Jillian Eaton returns today for follow-up.  She was recently seen in the hospital and discharged on 07/17/2017.  Today is a transition of care follow-up.  She was contacted on the day after discharge and felt to be doing well.  She is currently in rehabilitation.  She says she has had marked improvement in her shortness of breath.  She feels like her edema has improved.  She has decreased her dose of daily Lasix from 80 mg to 40 mg.  She is in persistent atrial fibrillation however rate controlled on amiodarone 400 mg which she is taking twice daily.  She reports she is getting stronger and hopefully will be out of rehabilitation within the next week.  She did miss 1 dose of Eliquis due to a transfer issue on May 28 and therefore will need 3 weeks of subsequent uninterrupted anticoagulation prior to attempted  cardioversion.  09/13/2017  Jillian Eaton returns today for follow-up of cardioversion.  Unfortunately this was unsuccessful after 3 shocks.  She told me there is some confusion afterwards that she forgot to take her amiodarone.  She was taking it up until the cardioversion.  Since it was unsuccessful, I recommended rate control strategy.  At this point there is little benefit from being on amiodarone and the side effect profile would not make it favorable.  I would recommend just continuing carvedilol for rate control.  She is had significant weight loss now down to 313 from 340.  This will continue to help her symptoms.  Her shortness of breath has improved.  She has had no significant worsening edema.  No further significant GU bleeding.  09/28/2017  Jillian Eaton returns today for follow-up of multiple ER visits.  I last saw her a couple weeks ago and unfortunately she was maintaining A. fib after unsuccessful cardioversion.  She continues to lose weight.  In fact she is down to 306 pounds today from 313.  Lab work indicates a rising creatinine and very low BNP, which makes me feel that she may be over diuresed.  She is also noted to be anemic with iron deficiency and is anticoagulated on Eliquis.  Her PCP is likely to start her on iron.  She denies any blood in the stool however stool guaiacs or work-up for microscopic iron is indicated.  She reports intermittent hematuria which is been minor.  Blood pressure is noted to be low today 94/69.  She is on low-dose carvedilol for rate control as well as hydrochlorothiazide and low-dose Lasix.  Recently she said she has had improvement in her breathing and for the first time the other day was able to sleep without oxygen.  PMHx:  Past Medical History:  Diagnosis Date  . Anemia   . Asthma   . CHF (congestive heart failure) ([place])   . Chronic headache   . DOE (dyspnea on exertion)    2D ECHO, 02/12/2012 - EF 60-65%, moderate concentric hypertrophy  .  Fibromyalgia    nerve pain"left side at waist level" "can't lay on that side without pain" , "HOB elevation helps"  . Hematuria - cause not known   . History of kidney stones    x 2 '13, '14 surgery to remove  . Hypertension   . SBO (small bowel obstruction) ([place]) 06/07/2013  .  Thyroid disease    "goiter"  . Transfusion history    10 yrs+    Past Surgical History:  Procedure Laterality Date  . CARDIAC CATHETERIZATION  04/04/2010   No significant obstructive coronary artery disease  . CARDIOVERSION N/A 08/08/2017   Procedure: CARDIOVERSION;  Surgeon: Pixie Casino, MD;  Location: [facility];  Service: Cardiovascular;  Laterality: N/A;  . CHOLECYSTECTOMY  1990  . COLONOSCOPY W/ POLYPECTOMY    . COLONOSCOPY WITH PROPOFOL N/A 04/10/2014   Procedure: COLONOSCOPY WITH PROPOFOL;  Surgeon: Beryle Beams, MD;  Location: [facility];  Service: Endoscopy;  Laterality: N/A;  . DIAGNOSTIC LAPAROSCOPY     x2 bowel obstructions(adhesions)  . 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 THYROIDECTOMY    . TUBAL LIGATION  1986    FAMHx:  Family History  Problem Relation Age of Onset  . Diabetes Mother   . Epilepsy Mother   . Cancer Mother        Breast  . Hypertension Mother   . Breast cancer Mother   . Kidney disease Father   . Diabetes Father   . Hypertension Father   . Asthma Father   . Heart disease Father   . Epilepsy Sister   . Cancer Maternal Grandmother   . Breast cancer Maternal Grandmother   . Cancer Paternal Grandmother   . Breast cancer Paternal Grandmother     SOCHx:   reports that she has never smoked. She has never used smokeless tobacco. She reports that she does not drink alcohol or use drugs.  ALLERGIES:  Allergies  Allergen Reactions  . Norco [Hydrocodone-Acetaminophen] Itching    ROS: Pertinent items noted in HPI and remainder of comprehensive ROS otherwise negative.  HOME MEDS: Current Outpatient Medications   Medication Sig Dispense Refill  . acetaminophen (TYLENOL) 650 MG CR tablet Take 1,300 mg by mouth daily.     Marland Kitchen apixaban (ELIQUIS) 5 MG TABS tablet Take 1 tablet (5 mg total) by mouth 2 (two) times daily. 60 tablet 6  . budesonide-formoterol (SYMBICORT) 160-4.5 MCG/ACT inhaler Inhale 2 puffs into the lungs 2 (two) times daily as needed (shortness of breath.).     Marland Kitchen calcitRIOL (ROCALTROL) 0.5 MCG capsule TAKE ONE CAPSULE BY MOUTH TWICE A DAY 60 capsule 0  . calcium carbonate (OS-CAL - DOSED IN MG OF ELEMENTAL CALCIUM) 1250 (500 Ca) MG tablet Take 1 tablet (1,250 mg total) by mouth 3 (three) times daily with meals. (Patient taking differently: Take 1,250 mg by mouth 3 (three) times daily. ) 90 tablet 0  . carvedilol (COREG) 3.125 MG tablet Take 1 tablet (3.125 mg total) by mouth 2 (two) times daily with a meal. 60 tablet 0  . Cyanocobalamin (VITAMIN B-12 IJ) Inject as directed. Monthly injections    . DULoxetine (CYMBALTA) 60 MG capsule Take 1 capsule (60 mg total) by mouth daily. 30 capsule 0  . eszopiclone (LUNESTA) 2 MG TABS tablet Take 2 mg by mouth at bedtime.  1  . furosemide (LASIX) 20 MG tablet Take 20 mg by mouth every morning.     Marland Kitchen levothyroxine (SYNTHROID, LEVOTHROID) 200 MCG tablet Take 1 tablet (200 mcg total) by mouth daily before breakfast. 90 tablet 3  . LYRICA 200 MG capsule Take 200 mg by mouth 2 (two) times daily.   2  . magnesium oxide (MAG-OX) 400 (241.3 Mg) MG tablet Take 400 mg by mouth daily.  0  . meloxicam (MOBIC)  15 MG tablet Take 15 mg by mouth daily as needed (pain).     . methocarbamol (ROBAXIN) 500 MG tablet Take 500 mg by mouth at bedtime.  0  . mirabegron ER (MYRBETRIQ) 50 MG TB24 tablet Take 50 mg by mouth daily.    . OXYGEN Inhale 2 L/min into the lungs as needed (shortness of breath).     . senna (SENOKOT) 8.6 MG TABS tablet Take 1 tablet (8.6 mg total) by mouth daily. 120 each 0  . traZODone (DESYREL) 50 MG tablet Take 50 mg by mouth 2 (two) times daily.  0  .  Vitamin D, Ergocalciferol, (DRISDOL) 50000 units CAPS capsule Take 50,000 Units by mouth every 7 (seven) days.    . vitamin B-12 100 MCG tablet Take 1 tablet (100 mcg total) by mouth daily. (Patient not taking: Reported on 09/18/2017) 30 tablet 0   No current facility-administered medications for this visit.     LABS/IMAGING: No results found for this or any previous visit (from the past 48 hour(s)). No results found.  VITALS: BP 94/69   Pulse 96   Ht 5\' 5"  (1.651 m)   Wt (!) 306 lb (138.8 kg)   BMI 50.92 kg/m   EXAM: General appearance: alert, morbidly obese and in wheelchair Neck: no carotid bruit and no JVD Lungs: diminished breath sounds bilaterally Heart: regular rate and rhythm Abdomen: soft, non-tender; bowel sounds normal; no masses,  no organomegaly Extremities: extremities normal, atraumatic, no cyanosis or edema Pulses: 2+ and symmetric Skin: Skin color, texture, turgor normal. No rashes or lesions Neurologic: Grossly normal Psych: Pleasant  EKG: A. fib at 96-personally reviewed  ASSESSMENT: 1. New onset A. fib with RVR-CHADSVASC score of 4 -failed cardioversion on amiodarone 2. Acute diastolic congestive heart failure-LVEF 25% (improved to 60-65% in 06/2016) 3. Super morbid obesity, with failure of 3 gastric bypass procedures 4. Pulmonary hypertension - PA pressure of 64 mmHg, normal LV systolic function 5. Progressive DOE 6. Hypertension-controlled  7. Possible A. fib/ectopic atrial tachycardia-resolved  PLAN: 1.   Ms. Scalese noted to be hypotensive today with slightly rising creatinine and continued weight loss.  I suspect she may be over diuresed.  I like to discontinue hydrochlorothiazide but she will remain on low-dose Lasix 20 mg daily and her low-dose carvedilol for rate control.  She should continue Eliquis.  I agree with further work-up by her primary care provider with regards to anemia and repleting iron.  In addition, it appears that she is  iatrogenically hyperthyroid.  Her dose of Synthroid has been decreased and this may have also contributed to her ongoing weight loss.  Her thyroid function could have been perturbed from amiodarone which she was taking for about a month it has been discontinued.  She does have follow-up with her endocrinologist in October.  Follow-up with me after that at our regular scheduled appointment.  Pixie Casino, MD, [facility], [place] Director of the [facility] Diplomate of the American Board of Clinical Lipidology Attending Cardiologist  Direct Dial: [phone]  Fax: [phone]  Website:  [email or web address] 09/28/2017, 12:11 PM

## 2017-10-01 DIAGNOSIS — M6281 Muscle weakness (generalized): Secondary | ICD-10-CM | POA: Diagnosis not present

## 2017-10-02 ENCOUNTER — Other Ambulatory Visit: Payer: Self-pay | Admitting: Endocrinology

## 2017-10-03 DIAGNOSIS — M6281 Muscle weakness (generalized): Secondary | ICD-10-CM | POA: Diagnosis not present

## 2017-10-05 DIAGNOSIS — M6281 Muscle weakness (generalized): Secondary | ICD-10-CM | POA: Diagnosis not present

## 2017-10-08 ENCOUNTER — Telehealth: Payer: Self-pay

## 2017-10-08 MED ORDER — LEVOTHYROXINE SODIUM 175 MCG PO TABS: 175.0000 ug | ORAL_TABLET | Freq: Every day | ORAL | 0 refills | Status: DC

## 2017-10-08 NOTE — Progress Notes (Signed)
C, her TSH was recently low >> we need to decrease her LT4 from 200 mcg to 175 mcg daily >> can you please call this is for her and let her know - also, take the 200 mcg LT4 off her med list/ I will recheck her TFTs when she comes back in 11/2017.

## 2017-10-08 NOTE — Progress Notes (Signed)
Pt stated that she has only been taking the 236mcg for a week and was taking the 350mcg at the time of those results. 119mcg was sent already. Pt stated that she has enough to last her until close to her appointment. Please advise

## 2017-10-08 NOTE — Telephone Encounter (Addendum)
-----   Message from Philemon Kingdom, MD sent at 10/08/2017 12:46 PM EDT -----  C, her TSH was recently low >> we need to decrease her LT4 from 200 mcg to 175 mcg daily >> can you please call this is for her and let her know - also, take the 200 mcg LT4 off her med list/ I will recheck her TFTs when she comes back in 11/2017.

## 2017-10-08 NOTE — Progress Notes (Signed)
She stated she wanted to finish the bottle and did not understand that she was supposed to switch immediatly

## 2017-10-08 NOTE — Progress Notes (Signed)
If she was taking 300 mcg at the time of the tests (??? Why), she needs to continue with the 200 now.

## 2017-10-09 DIAGNOSIS — R635 Abnormal weight gain: Secondary | ICD-10-CM | POA: Diagnosis not present

## 2017-10-09 DIAGNOSIS — M25511 Pain in right shoulder: Secondary | ICD-10-CM | POA: Diagnosis not present

## 2017-10-09 DIAGNOSIS — D509 Iron deficiency anemia, unspecified: Secondary | ICD-10-CM | POA: Diagnosis not present

## 2017-10-09 DIAGNOSIS — W19XXXA Unspecified fall, initial encounter: Secondary | ICD-10-CM | POA: Diagnosis not present

## 2017-10-09 DIAGNOSIS — D519 Vitamin B12 deficiency anemia, unspecified: Secondary | ICD-10-CM | POA: Diagnosis not present

## 2017-10-11 ENCOUNTER — Ambulatory Visit
Admission: RE | Admit: 2017-10-11 | Discharge: 2017-10-11 | Disposition: A | Discharge status: Home / Self Care | Payer: Medicare Other | Source: Ambulatory Visit | Attending: Internal Medicine | Admitting: Internal Medicine

## 2017-10-11 ENCOUNTER — Other Ambulatory Visit: Payer: Self-pay | Admitting: Internal Medicine

## 2017-10-11 ENCOUNTER — Ambulatory Visit (HOSPITAL_COMMUNITY)
Admission: RE | Admit: 2017-10-11 | Discharge: 2017-10-11 | Disposition: A | Discharge status: Home / Self Care | Payer: Medicare Other | Source: Ambulatory Visit | Attending: Internal Medicine | Admitting: Internal Medicine

## 2017-10-11 DIAGNOSIS — M25511 Pain in right shoulder: Secondary | ICD-10-CM

## 2017-10-11 DIAGNOSIS — D509 Iron deficiency anemia, unspecified: Secondary | ICD-10-CM | POA: Diagnosis not present

## 2017-10-11 DIAGNOSIS — S4991XA Unspecified injury of right shoulder and upper arm, initial encounter: Secondary | ICD-10-CM | POA: Diagnosis not present

## 2017-10-11 MED ORDER — SODIUM CHLORIDE 0.9 % IV SOLN
200.0000 mg | Freq: Once | INTRAVENOUS | Status: AC
  Administered 2017-10-11: 200 mg via INTRAVENOUS
  Filled 2017-10-11: qty 10

## 2017-10-11 MED ORDER — SODIUM CHLORIDE 0.9 % IV SOLN
Freq: Once | INTRAVENOUS | Status: AC
  Administered 2017-10-11: 11:00:00 via INTRAVENOUS

## 2017-10-11 MED ORDER — NON FORMULARY: 200.0000 mg | Freq: Once | Status: DC

## 2017-10-11 NOTE — Progress Notes (Signed)
PATIENT CARE CENTER NOTE  Diagnosis: Iron Deficiency Anemia    Provider: Dr. Maia Petties   Procedure: IV Venofer   Note: Patient received Venofer infusion. Tolerated infusion well with no adverse reaction. Monitored patient for 30 minutes post-infusion. Vital signs remained stable. Discharge instructions given to patient. Patient alert, oriented and ambulatory with walker at discharge.

## 2017-10-11 NOTE — Discharge Instructions (Signed)
Iron Sucrose injection What is this medicine? IRON SUCROSE (AHY ern SOO krohs) is an iron complex. Iron is used to make healthy red blood cells, which carry oxygen and nutrients throughout the body. This medicine is used to treat iron deficiency anemia in people with chronic kidney disease. This medicine may be used for other purposes; ask your health care provider or pharmacist if you have questions. COMMON BRAND NAME(S): Venofer What should I tell my health care provider before I take this medicine? They need to know if you have any of these conditions: -anemia not caused by low iron levels -heart disease -high levels of iron in the blood -kidney disease -liver disease -an unusual or allergic reaction to iron, other medicines, foods, dyes, or preservatives -pregnant or trying to get pregnant -breast-feeding How should I use this medicine? This medicine is for infusion into a vein. It is given by a health care professional in a hospital or clinic setting. Talk to your pediatrician regarding the use of this medicine in children. While this drug may be prescribed for children as young as 2 years for selected conditions, precautions do apply. Overdosage: If you think you have taken too much of this medicine contact a poison control center or emergency room at once. NOTE: This medicine is only for you. Do not share this medicine with others. What if I miss a dose? It is important not to miss your dose. Call your doctor or health care professional if you are unable to keep an appointment. What may interact with this medicine? Do not take this medicine with any of the following medications: -deferoxamine -dimercaprol -other iron products This medicine may also interact with the following medications: -chloramphenicol -deferasirox This list may not describe all possible interactions. Give your health care provider a list of all the medicines, herbs, non-prescription drugs, or dietary  supplements you use. Also tell them if you smoke, drink alcohol, or use illegal drugs. Some items may interact with your medicine. What should I watch for while using this medicine? Visit your doctor or healthcare professional regularly. Tell your doctor or healthcare professional if your symptoms do not start to get better or if they get worse. You may need blood work done while you are taking this medicine. You may need to follow a special diet. Talk to your doctor. Foods that contain iron include: whole grains/cereals, dried fruits, beans, or peas, leafy green vegetables, and organ meats (liver, kidney). What side effects may I notice from receiving this medicine? Side effects that you should report to your doctor or health care professional as soon as possible: -allergic reactions like skin rash, itching or hives, swelling of the face, lips, or tongue -breathing problems -changes in blood pressure -cough -fast, irregular heartbeat -feeling faint or lightheaded, falls -fever or chills -flushing, sweating, or hot feelings -joint or muscle aches/pains -seizures -swelling of the ankles or feet -unusually weak or tired Side effects that usually do not require medical attention (report to your doctor or health care professional if they continue or are bothersome): -diarrhea -feeling achy -headache -irritation at site where injected -nausea, vomiting -stomach upset -tiredness This list may not describe all possible side effects. Call your doctor for medical advice about side effects. You may report side effects to FDA at 1-800-FDA-1088. Where should I keep my medicine? This drug is given in a hospital or clinic and will not be stored at home. NOTE: This sheet is a summary. It may not cover all possible information. If   you have questions about this medicine, talk to your doctor, pharmacist, or health care provider.  2018 Elsevier/Gold Standard (2010-11-17 17:14:35)  

## 2017-10-12 ENCOUNTER — Ambulatory Visit (INDEPENDENT_AMBULATORY_CARE_PROVIDER_SITE_OTHER): Payer: Medicare Other | Admitting: Internal Medicine

## 2017-10-12 ENCOUNTER — Encounter: Payer: Self-pay | Admitting: Internal Medicine

## 2017-10-12 VITALS — BP 112/70 | HR 80 | Ht 65.0 in | Wt 318.8 lb

## 2017-10-12 DIAGNOSIS — Z79899 Other long term (current) drug therapy: Secondary | ICD-10-CM

## 2017-10-12 DIAGNOSIS — I4891 Unspecified atrial fibrillation: Secondary | ICD-10-CM | POA: Diagnosis not present

## 2017-10-12 DIAGNOSIS — I5031 Acute diastolic (congestive) heart failure: Secondary | ICD-10-CM

## 2017-10-12 DIAGNOSIS — M6281 Muscle weakness (generalized): Secondary | ICD-10-CM | POA: Diagnosis not present

## 2017-10-12 MED ORDER — FUROSEMIDE 40 MG PO TABS: 40.0000 mg | ORAL_TABLET | Freq: Every morning | ORAL | 11 refills | Status: DC

## 2017-10-12 NOTE — Patient Instructions (Addendum)
Your physician has recommended you make the following change in your medication:  -- increase lasix to 40mg  daily  - you can take 2 of your 20mg  tablets until you run out  - a new prescription has been sent to pharmacy for 40mg  tablets  Your physician recommends that you return for lab work in one week - BMET, BNP  Your physician recommends that you schedule a follow-up appointment as planned in October

## 2017-10-12 NOTE — Progress Notes (Signed)
OFFICE NOTE  Chief Complaint:  Follow-up  Primary Care Physician: [name], MD  HPI:  Jillian Eaton is a 67 year old female I saw a few weeks ago with a history of super morbid obesity, fibromyalgia and increasing shortness of breath. She had a cath in 2012 which showed normal coronaries, mildly elevating filling pressures, and diastolic pressures with a mean PA of 31. This correlated with her echocardiogram when RV systolic pressures in 0626 were 49 on echo. A repeat echocardiogram was just performed which demonstrated a preserved LVEF of 60-65% with moderate concentric LVH. There was mild to moderate increase in pulmonary artery pressure with peak at 51, which has not significantly changed from her study 1 year ago. Although the pressures look very similar her shortness of breath has been increasing significantly, and I recommended that she wear oxygen at night since she had that at home which does seem to be helping her at least feel better during the day as I suspect she has sleep apnea. She has been tested before which was apparently negative and is considering retesting at some point in the future. Overall I think the main issue obviously is weight, and she unfortunately says that she is not a candidate for gastric bypass and therefore it is a very difficult situation.  I referred her back to her pulmonologist in [facility] for ongoing evaluation of pulmonary hypertension. She tells me that she was then referred to [facility] and saw a specialist there who did another right heart catheterization but did not recommend any medications other than better blood pressure control.   In addition she had problems with kidney stones and underwent 2 operations regarding tthis.  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  She is now dependent on thyroid medication. Unfortunately she's not been able to lose weight, but her weight is fairly stable around  400 pounds as is her shortness of breath.  Cath in 2012:  LEFT HEART CATHETERIZATION  OPERATOR: [place] Breeanne Oblinger, MD, and Rolland Porter, MD  INDICATION: Dyspnea on exertion and chest pressure.  HISTORY OF PRESENT ILLNESS: Jillian Eaton is a morbidly obese (BMI greater than 5) female with a history of failed gastric bypass x2 with an increasing weight gain and increasing shortness of breath as well as new- onset dyspnea on exertion. She reports that she can only walk about 10 feet before she becomes short of breath and has chest pressure which she says get better with rest. She has numerous cardiac risk factors and given the high likelihood of false positive stress test, I have referred her for cardiac catheterization, both left and right heart as she has had elevated RVSP on echocardiogram of approximately 30 to 31 mmHg.  PROCEDURE: The patient was brought into the cardiac catheterization lab, sterilely prepped and draped in the usual fashion. The area around the right femoral artery and right brachial vein were cleansed and draped to allow an attempt at radial arterial and brachiocephalic venous access. IV was not able to be obtained prior to the procedure given her body habitus. With difficulty in assessing the vein, the ultrasound was eventually used to identify the right brachiocephalic vein, however, cannulation with needle and wire was not possible as the vein was very small in caliber. After mild local bleeding was controlled, we did turn our attention to the right femoral vein and with great difficulty using the ultrasound, the right femoral vein was accessed by Dr. Ellyn Hack  using a straight wire and a needle. After venous access was obtained, the attention was turned to the right radial artery by Dr. Ellyn Hack and simultaneous to that right heart catheterization was performed by myself without any difficulty. The right radial artery was successfully cannulated  and subsequently left coronary artery system was selectively injected with a 5-French TIG 4.0 catheter, however the right coronary artery could not be cannulated with the TIG catheter and was eventually cannulated with a JR-4 catheter. LV pressure was measured with a pigtail catheter. Estimated blood loss was about 30 mL. There were no acute complications. The patient received a total of 9 mg of Versed throughout the procedure as well as 225 mcg of fentanyl and was at no point greater than moderately sedated. She received 5000 units of heparin about 10 mL of a radial cocktail.  FINDINGS: 1. Left main - short, no disease. 2. LAD - no significant disease. 3. Left circumflex, no significant disease. 4. RCA - dominant, no disease, large-caliber vessel. 5. LVEDP = 20 mmHg. 6. RA - 12. 7. RV 38/12. 8. PA - 43/19 (31). 9. PCWP - 24. 10.TPG - 7. 11.Fick cardiac output/Fick cardiac index - 10.56/3.84. 12.Thermodilution cardiac output/thermodilution cardiac index -  6.78/2.47. 13.Aortic saturation - 94%. 14.PA saturation - 68%.  IMPRESSION: 1. No significant obstructive coronary artery disease. 2. LVEDP = 20 mmHg. 3. Borderline pulmonary venous hypertension. 4. High cardiac output.  Jillian Eaton returns today for follow-up. She reports that her shortness of breath has not significantly worsened, in fact, possibly is slightly better. She did have thyroid surgery last year at [facility] and apparently underwent right heart catheterization prior to that. I do not have those records immediately available. Unfortunately she continues to maintain her weight and has not been able to lose any. She is complaining of some numbness and tingling in her feet which is likely neuropathy. She is on medication including Lyrica which she takes for fibromyalgia.  Jillian Eaton returns today for follow-up. She recently is been having more shortness of breath and lower extremity  swelling. She pointed out edema in her legs with very small blisters and some chronic venous stasis changes. She is not currently on a diuretic. She recently saw another new primary care provider with the [facility]. She was noted to be started on lisinopril 40 mg daily, which she is taking in addition to losartan 100 mg daily. The notes do not indicate from her office visit why she was started on lisinopril, but I can see through care everywhere that this was ordered by her primary care provider. She also takes amlodipine for blood pressure control. Her blood pressure was elevated initially at 178/94, but after resting came down to 120/78 and is at goal today. It is unusual, however to use both ACE inhibitors and ARBs.  07/02/2015  Mrs. Caamano returns today for follow-up. She underwent a repeat echocardiogram for progressive dyspnea and exertion which showed a preserved LVEF of 6065% however there is moderate to severe pulmonary hypertension with an RVSP of 64 mmHg. This is increased about 10 mmHg compared to her prior study. Her mean pulmonary pressure by catheterization in 2012 was only 31 mmHg. I suspect that progressive pulmonary hypertension as a cause of her worsening shortness of breath. In fact, during recent hospitalization for surgery she required discharged with oxygen. She says she rarely uses oxygen at home but notes that she is hypoxic often when she checks her oxygen levels.  At her last office visit I recommended Lasix which she has been using sparingly. She has problems with incontinence and she does report an improvement in her swelling with it but does not notice significant change in her shortness of breath.  06/06/2016  Mrs. Lajara returns from hospital follow-up. She was just admitted after an admission in January for unintentional narcotic overdose. She had unresponsiveness and possible aspiration. There was a second admission for a similar presentation with respiratory  failure, fever, sepsis and ultimately she was found to have a new onset cardiomyopathy with EF as low as 25%. Was felt that this was nonischemic. She was having intermittent atrial tachycardia and was felt that this might need to be managed with antiarrhythmic therapy however it seems to have resolved with carvedilol. She was placed on diuretics and reports that her breathing is close to baseline. She remains hypoxic with an O2 saturation 93% however was on home oxygen is result of her severe pulmonary hypertension. She does not feel that she needs to use the oxygen very regularly. From a heart failure standpoint she is on carvedilol, aspirin, furosemide and not currently on an ACE inhibitor, ARB or Entresto. Recent testing a renal function shows normal creatinine.  10/11/2016  Mrs. Nadeem is seen today in follow-up. In July she was hospitalized for hyperkalemia. She is on supplemental potassium and lisinopril, both were stopped. Fortunately her echo has improved back to normal recently. Overall she feels well. Her weight is down about 40 pounds of the recently she gained about 20 pounds back. She is working on that right as we speak.  04/03/2017  Mrs. Sawaya was seen today in follow-up.  She recently called in and had complaints of worsening shortness of breath.  She saw her pulmonary doctor who did an x-ray noted a mild right upper lobe infiltrate versus edema.  Labs were obtained including BNP which was only mildly elevated at 110.  She was given 20 mg of Lasix with no benefit and then recently was advised over the phone to increase her Lasix to 40 mg for 2 days.  She urinated quite a bit and lost about 5 pounds of what she feels was water weight.  She reports mild improvement in her breathing.  She is quite anxious about what might have led to this fluid gain.  Given her history of cardiomyopathy, it is possible she could have had some recurrent cardiomyopathy.  She reports stable diet and no worsening abuse  of sodium.  06/19/2017  Mrs. Thakur returns today for follow-up.  She was supposed to have a limited echocardiogram but that never happened.  Approximately 2 to 3 weeks ago she called the office reporting palpitations.  We try to schedule an earlier appointment, but she ended up going to [facility] for her [facility].  On the flight she apparently became short of breath had some left flank pain and was hypoxic.  She was given oxygen and after landing they took her to the [facility] in [place].  She was treated there and found to be in A. fib with RVR.  This is a new finding.  She had a remote history of either PAT or PAF, but so brief that she was not anticoagulated.  She was then started on Xarelto.  She tells me she was given Lasix and diuresed.  She returns today for follow-up and still reports some shortness of breath.  Weight is about 6 pounds heavier than she was 2 months ago.  She is in A. fib persistently with heart rates in the low 100s.  In addition she reports after starting the Xarelto that she has been having some vaginal bleeding.  07/26/2017  Mrs. Karge returns today for follow-up.  She was recently seen in the hospital and discharged on 07/17/2017.  Today is a transition of care follow-up.  She was contacted on the day after discharge and felt to be doing well.  She is currently in rehabilitation.  She says she has had marked improvement in her shortness of breath.  She feels like her edema has improved.  She has decreased her dose of daily Lasix from 80 mg to 40 mg.  She is in persistent atrial fibrillation however rate controlled on amiodarone 400 mg which she is taking twice daily.  She reports she is getting stronger and hopefully will be out of rehabilitation within the next week.  She did miss 1 dose of Eliquis due to a transfer issue on May 28 and therefore will need 3 weeks of subsequent uninterrupted anticoagulation prior to attempted cardioversion.  09/13/2017  Mrs. Edelson  returns today for follow-up of cardioversion.  Unfortunately this was unsuccessful after 3 shocks.  She told me there is some confusion afterwards that she forgot to take her amiodarone.  She was taking it up until the cardioversion.  Since it was unsuccessful, I recommended rate control strategy.  At this point there is little benefit from being on amiodarone and the side effect profile would not make it favorable.  I would recommend just continuing carvedilol for rate control.  She is had significant weight loss now down to 313 from 340.  This will continue to help her symptoms.  Her shortness of breath has improved.  She has had no significant worsening edema.  No further significant GU bleeding.  09/28/2017  Mrs. Maita returns today for follow-up of multiple ER visits.  I last saw her a couple weeks ago and unfortunately she was maintaining A. fib after unsuccessful cardioversion.  She continues to lose weight.  In fact she is down to 306 pounds today from 313.  Lab work indicates a rising creatinine and very low BNP, which makes me feel that she may be over diuresed.  She is also noted to be anemic with iron deficiency and is anticoagulated on Eliquis.  Her PCP is likely to start her on iron.  She denies any blood in the stool however stool guaiacs or work-up for microscopic iron is indicated.  She reports intermittent hematuria which is been minor.  Blood pressure is noted to be low today 94/69.  She is on low-dose carvedilol for rate control as well as hydrochlorothiazide and low-dose Lasix.  Recently she said she has had improvement in her breathing and for the first time the other day was able to sleep without oxygen.  10/12/2017  Mrs. Stice was seen again today in follow-up.  She called the office that she noted to have significant weight gain.  In July she was 313 and had lost weight down to 297.  Subsequently was found out that she was possibly overtreated with her thyroid medication and her dose  was decreased from 300 mcg to 200 mcg daily by her endocrinologist.  Since then she has gained weight but she has been up 12 pounds over the past 2 weeks.  She does report some edema.  I had stopped her hydrochlorothiazide due to hypotension and blood pressure is better today 112/70.  She takes low-dose  Lasix 20 mg daily.  She also reported that she now has stage III chronic kidney disease.  PMHx:  Past Medical History:  Diagnosis Date  . Anemia   . Asthma   . CHF (congestive heart failure) ([place])   . Chronic headache   . DOE (dyspnea on exertion)    2D ECHO, 02/12/2012 - EF 60-65%, moderate concentric hypertrophy  . Fibromyalgia    nerve pain"left side at waist level" "can't lay on that side without pain" , "HOB elevation helps"  . Hematuria - cause not known   . History of kidney stones    x 2 '13, '14 surgery to remove  . Hypertension   . SBO (small bowel obstruction) ([place]) 06/07/2013  . Thyroid disease    "goiter"  . Transfusion history    10 yrs+    Past Surgical History:  Procedure Laterality Date  . CARDIAC CATHETERIZATION  04/04/2010   No significant obstructive coronary artery disease  . CARDIOVERSION N/A 08/08/2017   Procedure: CARDIOVERSION;  Surgeon: Pixie Casino, MD;  Location: [facility];  Service: Cardiovascular;  Laterality: N/A;  . CHOLECYSTECTOMY  1990  . COLONOSCOPY W/ POLYPECTOMY    . COLONOSCOPY WITH PROPOFOL N/A 04/10/2014   Procedure: COLONOSCOPY WITH PROPOFOL;  Surgeon: Beryle Beams, MD;  Location: [facility];  Service: Endoscopy;  Laterality: N/A;  . DIAGNOSTIC LAPAROSCOPY     x2 bowel obstructions(adhesions)  . 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 THYROIDECTOMY    . TUBAL LIGATION  1986    FAMHx:  Family History  Problem Relation Age of Onset  . Diabetes Mother   . Epilepsy Mother   . Cancer Mother        Breast  . Hypertension Mother   . Breast cancer Mother   . Kidney disease Father   .  Diabetes Father   . Hypertension Father   . Asthma Father   . Heart disease Father   . Epilepsy Sister   . Cancer Maternal Grandmother   . Breast cancer Maternal Grandmother   . Cancer Paternal Grandmother   . Breast cancer Paternal Grandmother     SOCHx:   reports that she has never smoked. She has never used smokeless tobacco. She reports that she does not drink alcohol or use drugs.  ALLERGIES:  Allergies  Allergen Reactions  . Norco [Hydrocodone-Acetaminophen] Itching    ROS: Pertinent items noted in HPI and remainder of comprehensive ROS otherwise negative.  HOME MEDS: Current Outpatient Medications  Medication Sig Dispense Refill  . acetaminophen (TYLENOL) 650 MG CR tablet Take 1,300 mg by mouth daily.     Marland Kitchen apixaban (ELIQUIS) 5 MG TABS tablet Take 1 tablet (5 mg total) by mouth 2 (two) times daily. 60 tablet 6  . budesonide-formoterol (SYMBICORT) 160-4.5 MCG/ACT inhaler Inhale 2 puffs into the lungs 2 (two) times daily as needed (shortness of breath.).     Marland Kitchen calcitRIOL (ROCALTROL) 0.5 MCG capsule TAKE ONE CAPSULE BY MOUTH TWICE A DAY 60 capsule 0  . calcium carbonate (OS-CAL - DOSED IN MG OF ELEMENTAL CALCIUM) 1250 (500 Ca) MG tablet Take 1 tablet (1,250 mg total) by mouth 3 (three) times daily with meals. (Patient taking differently: Take 1,250 mg by mouth 3 (three) times daily. ) 90 tablet 0  . carvedilol (COREG) 3.125 MG tablet Take 1 tablet (3.125 mg total) by mouth 2 (two) times daily with a meal. 60 tablet 0  .  Cyanocobalamin (VITAMIN B-12 IJ) Inject as directed. Monthly injections    . DULoxetine (CYMBALTA) 60 MG capsule Take 1 capsule (60 mg total) by mouth daily. 30 capsule 0  . eszopiclone (LUNESTA) 2 MG TABS tablet Take 2 mg by mouth at bedtime.  1  . furosemide (LASIX) 40 MG tablet Take 1 tablet (40 mg total) by mouth every morning. 30 tablet 11  . levothyroxine (SYNTHROID, LEVOTHROID) 200 MCG tablet Take 200 mcg by mouth daily before breakfast.    . LYRICA 200  MG capsule Take 200 mg by mouth 2 (two) times daily.   2  . magnesium oxide (MAG-OX) 400 (241.3 Mg) MG tablet Take 400 mg by mouth daily.  0  . meloxicam (MOBIC) 15 MG tablet Take 15 mg by mouth daily as needed (pain).     . methocarbamol (ROBAXIN) 500 MG tablet Take 500 mg by mouth at bedtime.  0  . mirabegron ER (MYRBETRIQ) 50 MG TB24 tablet Take 50 mg by mouth daily.    . OXYGEN Inhale 2 L/min into the lungs as needed (shortness of breath).     . senna (SENOKOT) 8.6 MG TABS tablet Take 1 tablet (8.6 mg total) by mouth daily. 120 each 0  . traZODone (DESYREL) 50 MG tablet Take 50 mg by mouth 2 (two) times daily.  0  . vitamin B-12 100 MCG tablet Take 1 tablet (100 mcg total) by mouth daily. 30 tablet 0  . Vitamin D, Ergocalciferol, (DRISDOL) 50000 units CAPS capsule Take 50,000 Units by mouth every 7 (seven) days.     No current facility-administered medications for this visit.     LABS/IMAGING: No results found for this or any previous visit (from the past 48 hour(s)). Dg Shoulder Right  Result Date: 10/11/2017 CLINICAL DATA:  Fall 3 days ago with right shoulder pain. EXAM: RIGHT SHOULDER - 2+ VIEW COMPARISON:  Chest x-ray 09/18/2017 FINDINGS: Mild degenerate change of the [facility] joint and glenohumeral joints. No evidence of acute fracture or dislocation. IMPRESSION: No acute findings. Mild degenerative changes of the [facility] joint and glenohumeral joints. Electronically Signed   By: Marin Olp M.D.   On: 10/11/2017 15:18    VITALS: BP 112/70 (BP Location: Left Arm, Patient Position: Sitting, Cuff Size: Large)   Pulse 80   Ht 5\' 5"  (1.651 m)   Wt (!) 318 lb 12.8 oz (144.6 kg)   SpO2 (!) 87%   BMI 53.05 kg/m   EXAM: General appearance: alert, morbidly obese and in wheelchair Neck: JVD - a few cm above sternal notch and no carotid bruit Lungs: diminished breath sounds bilaterally Heart: regular rate and rhythm Abdomen: soft, non-tender; bowel sounds normal; no masses,  no  organomegaly Extremities: edema 1+ bilateral edema Pulses: 2+ and symmetric Skin: Skin color, texture, turgor normal. No rashes or lesions Neurologic: Grossly normal Psych: Pleasant  EKG: Deferred  ASSESSMENT: 1. New onset A. fib with RVR-CHADSVASC score of 4 -failed cardioversion on amiodarone 2. Acute diastolic congestive heart failure-LVEF 25% (improved to 60-65% in 06/2016) 3. Super morbid obesity, with failure of 3 gastric bypass procedures 4. Pulmonary hypertension - PA pressure of 64 mmHg, normal LV systolic function 5. Progressive DOE 6. Hypertension-controlled  7. Possible A. fib/ectopic atrial tachycardia-resolved  PLAN: 1.   Ms. Neira has again restarted gaining weight.  She is now up 12 pounds over the past couple weeks.  She reports worsening renal function and may not be adequately diuresed.  I stopped her hydrochlorothiazide however I do  not think that this explains her weight gain entirely.  Some of it may be edema, however I suspect some of it is because of the marked decrease in her thyroid medication from 300 to 200 mcg due to iatrogenic hyperthyroidism.  She also reports worsening fatigue, which is more consistent with decreasing her thyroid medication dose.  I recommend we increase her Lasix to 40 mg daily to reach threshold and hopefully improve urine output.  Blood pressure has improved after discontinuation of hydrochlorothiazide.  She was having issues with hypotension.  Pixie Casino, MD, [facility], [place] Director of the [facility]  [facility] Diplomate of the American Board of Clinical Lipidology Attending Cardiologist  Direct Dial: [phone]  Fax: [phone]  Website:  [email or web address]  Nadean Corwin Chrisette Man 10/12/2017, 2:25 PM

## 2017-10-16 ENCOUNTER — Ambulatory Visit: Payer: Medicare Other | Admitting: Internal Medicine

## 2017-10-17 ENCOUNTER — Other Ambulatory Visit: Payer: Self-pay | Admitting: Internal Medicine

## 2017-10-17 DIAGNOSIS — M25511 Pain in right shoulder: Secondary | ICD-10-CM

## 2017-10-17 DIAGNOSIS — M6281 Muscle weakness (generalized): Secondary | ICD-10-CM | POA: Diagnosis not present

## 2017-10-19 DIAGNOSIS — M6281 Muscle weakness (generalized): Secondary | ICD-10-CM | POA: Diagnosis not present

## 2017-10-27 ENCOUNTER — Inpatient Hospital Stay
Admission: RE | Admit: 2017-10-27 | Discharge: 2017-10-27 | Disposition: A | Discharge status: Home / Self Care | Payer: Medicare Other | Source: Ambulatory Visit | Attending: Internal Medicine | Admitting: Internal Medicine

## 2017-10-28 ENCOUNTER — Inpatient Hospital Stay
Admission: RE | Admit: 2017-10-28 | Discharge: 2017-10-28 | Disposition: A | Discharge status: Home / Self Care | Payer: Medicare Other | Source: Ambulatory Visit | Attending: Internal Medicine | Admitting: Internal Medicine

## 2017-11-01 ENCOUNTER — Other Ambulatory Visit: Payer: Self-pay

## 2017-11-01 ENCOUNTER — Ambulatory Visit
Admission: RE | Admit: 2017-11-01 | Discharge: 2017-11-01 | Disposition: A | Discharge status: Home / Self Care | Payer: Medicare Other | Source: Ambulatory Visit | Attending: Internal Medicine | Admitting: Internal Medicine

## 2017-11-01 DIAGNOSIS — M25511 Pain in right shoulder: Secondary | ICD-10-CM

## 2017-11-01 DIAGNOSIS — M75121 Complete rotator cuff tear or rupture of right shoulder, not specified as traumatic: Secondary | ICD-10-CM | POA: Diagnosis not present

## 2017-11-01 MED ORDER — CALCITRIOL 0.5 MCG PO CAPS: 0.5000 ug | ORAL_CAPSULE | Freq: Two times a day (BID) | ORAL | 2 refills | Status: DC

## 2017-11-02 DIAGNOSIS — N183 Chronic kidney disease, stage 3 (moderate): Secondary | ICD-10-CM | POA: Diagnosis not present

## 2017-11-02 DIAGNOSIS — R319 Hematuria, unspecified: Secondary | ICD-10-CM | POA: Diagnosis not present

## 2017-11-02 DIAGNOSIS — I129 Hypertensive chronic kidney disease with stage 1 through stage 4 chronic kidney disease, or unspecified chronic kidney disease: Secondary | ICD-10-CM | POA: Diagnosis not present

## 2017-11-02 DIAGNOSIS — D631 Anemia in chronic kidney disease: Secondary | ICD-10-CM | POA: Diagnosis not present

## 2017-11-06 DIAGNOSIS — E875 Hyperkalemia: Secondary | ICD-10-CM | POA: Diagnosis not present

## 2017-11-13 DIAGNOSIS — M25561 Pain in right knee: Secondary | ICD-10-CM | POA: Diagnosis not present

## 2017-11-13 DIAGNOSIS — M25511 Pain in right shoulder: Secondary | ICD-10-CM | POA: Diagnosis not present

## 2017-11-13 DIAGNOSIS — M25512 Pain in left shoulder: Secondary | ICD-10-CM | POA: Diagnosis not present

## 2017-11-14 ENCOUNTER — Other Ambulatory Visit: Payer: Self-pay | Admitting: Internal Medicine

## 2017-11-14 ENCOUNTER — Other Ambulatory Visit: Payer: Self-pay | Admitting: Oncology

## 2017-11-14 DIAGNOSIS — Z1231 Encounter for screening mammogram for malignant neoplasm of breast: Secondary | ICD-10-CM

## 2017-11-20 DIAGNOSIS — Z23 Encounter for immunization: Secondary | ICD-10-CM | POA: Diagnosis not present

## 2017-11-20 DIAGNOSIS — M25519 Pain in unspecified shoulder: Secondary | ICD-10-CM | POA: Diagnosis not present

## 2017-11-20 DIAGNOSIS — G4709 Other insomnia: Secondary | ICD-10-CM | POA: Diagnosis not present

## 2017-11-21 DIAGNOSIS — M25512 Pain in left shoulder: Secondary | ICD-10-CM | POA: Diagnosis not present

## 2017-11-23 ENCOUNTER — Ambulatory Visit: Payer: Medicare Other | Admitting: Internal Medicine

## 2017-11-23 NOTE — Progress Notes (Deleted)
Patient ID: Jillian Eaton, female   DOB: [birth year], 67 y.o.   MRN: [id number]    HPI  Jillian Eaton is a 66 y.o.-year-old female, initially referred by her PCP, Dr. Sabas Sous, returning for h/o thyroid microcarcinoma,  postsurgical hypothyroidism and postsurgical hypocalcemia. Last visit 4 mo ago.  She was admitted 3 times since last for atrial fibrillation/shortness of breath.  Since last visit, we started calcitriol 0.5 mcg twice a day.  I also sent her a message to start HCTZ 25 mg daily while reducing Lasix to only 40 mg daily.  Latest labs from 10/09/2017 were reviewed and her calcium were still low, while phosphorus, magnesium.  Vitamin D level was normal in 06/2017.  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 Fredirick Maudlin at [facility].  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  She developed postsurgical hypothyroidism and, unfortunately, also postsurgical hypocalcemia, both uncontrolled.  She is still in rehab to improve her strength.  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 Benign hyperplastic thyroid tissue. See comment. COMMENT: The papillary carcinoma appears to be incidental.  On 06/19/2017 she developed atrial fibrillation.  At that time, TSH is very suppressed,  at 0.07.  She is on Levothyroxine 200 mcg (dose decreased 05/2017 from 300 mcg daily).  Pt takes LT4: - ~5 am - fasting, with water - at least 30 min from b'fast - no MVI, PPIs - + Fe- at 8:30 am. She has a h/o iron infusions - + calcium - first dose at 8:30 am - not on Biotin  TFTs remain uncontrolled: Lab Results  Component Value Date   TSH 0.034 (L) 09/21/2017   TSH 1.48 08/03/2017   TSH 0.007  (L) 06/19/2017   TSH 0.046 (L) 08/29/2016   TSH 9.239 (H) 04/25/2016   TSH 68.907 (H) 03/10/2016   FREET4 2.87 (H) 09/21/2017   FREET4 1.04 08/03/2017   FREET4 2.43 (H) 06/19/2017   FREET4 1.04 08/29/2016   Pt denies: - feeling nodules in neck - hoarseness - choking - SOB with lying down  But has: - dysphagia - chronic  She has no FH of thyroid disorders. No FH of thyroid cancer. No h/o radiation tx to head or neck.  No Biotin use. No recent steroids use.   Hypocalcemia:  - iatrogenic, after thyroid sx - despite the fact that no parathyroid glands were resected  This is uncontrolled.  She was admitted with hypocalcemia and acute respiratory failure on 07/10/2017.   Reviewed pertinent labs - interestingly, PTH is normal, possibly related to CKD: 10/09/2017: Ca 8.1 (8.7-10.3) Lab Results  Component Value Date   PTH 44 08/03/2017   PTH 65 07/10/2017   PTH Comment 07/10/2017   CALCIUM 8.6 (L) 09/21/2017   CALCIUM 8.5 (L) 09/18/2017   CALCIUM 5.7 (LL) 08/03/2017   CALCIUM 6.9 (L) 07/17/2017   CALCIUM 6.7 (L) 07/16/2017   CALCIUM 6.2 (LL) 07/15/2017   CALCIUM 6.0 (LL) 07/14/2017   CALCIUM 5.8 (LL) 07/13/2017   CALCIUM 5.9 (LL) 07/12/2017   CALCIUM 6.1 (LL) 07/11/2017   She has a history of low magnesium and high phosphorus - both normal at last check: 10/09/2017: Mg 2.0 (1.6-2.3) Lab Results  Component Value Date   MG 1.5 08/03/2017   MG 2.1 07/15/2017   MG 1.6 (  L) 07/14/2017   MG 1.3 (L) 07/13/2017   MG 1.2 (L) 07/10/2017   MG 1.8 07/11/2016   MG 2.2 07/09/2016   MG 1.6 (L) 07/09/2016   MG 1.9 07/08/2016   MG 2.0 07/08/2016   MG 1.6 (L) 07/08/2016   MG 1.6 (L) 05/03/2016   MG 1.8 04/25/2016   MG 1.9 04/25/2016   MG 2.0 04/24/2016   MG 1.2 (L) 04/23/2016   MG 2.2 03/12/2016   MG 1.4 (L) 03/10/2016   10/09/2017: phos 4.5 (2.5-4.5) Lab Results  Component Value Date   PHOS 6.3 (H) 08/03/2017   PHOS 7.4 (H) 07/10/2017   PHOS 3.6 07/11/2016   PHOS 4.0  07/09/2016   PHOS 3.5 07/09/2016   PHOS 5.2 (H) 07/08/2016   PHOS 6.6 (H) 07/08/2016   PHOS 8.0 (H) 07/08/2016   PHOS 3.6 04/25/2016   PHOS 3.5 04/25/2016   PHOS 3.3 04/24/2016   PHOS 3.7 03/12/2016   PHOS 6.2 (H) 03/10/2016    She is on: - CaCO3 1500 mg 3x a day with meals - iv calcium gluconate 1 g daily - high-dose vitamin D 50,000 U weekly - calcitriol 0.5 mcg 2x a day  She did not start HCTZ as advised***.  No hand cramping or perioral numbness.  She has a h/o vitamin D deficiency.  Latest level was normal: Lab Results  Component Value Date   VD25OH 43.9 07/14/2017   Of note, she is on amiodarone 400 mg daily, Lasix 40-80 mg daily.  + CKD. Last BUN/Cr: Lab Results  Component Value Date   BUN 27 (H) 09/21/2017   CREATININE 1.37 (H) 09/21/2017    ROS: Constitutional: no weight gain/no weight loss, no fatigue, no subjective hyperthermia, no subjective hypothermia Eyes: no blurry vision, no xerophthalmia ENT: no sore throat, + see HPI Cardiovascular: no CP/no SOB/no palpitations/no leg swelling Respiratory: no cough/no SOB/no wheezing Gastrointestinal: no N/no V/no D/no C/no acid reflux Musculoskeletal: no muscle aches/no joint aches Skin: no rashes, no hair loss Neurological: no tremors/no numbness/no tingling/no dizziness  I reviewed pt's medications, allergies, PMH, social hx, family hx, and changes were documented in the history of present illness. Otherwise, unchanged from my initial visit note.  Past Medical History:  Diagnosis Date  . Anemia   . Asthma   . CHF (congestive heart failure) ([place])   . Chronic headache   . DOE (dyspnea on exertion)    2D ECHO, 02/12/2012 - EF 60-65%, moderate concentric hypertrophy  . Fibromyalgia    nerve pain"left side at waist level" "can't lay on that side without pain" , "HOB elevation helps"  . Hematuria - cause not known   . History of kidney stones    x 2 '13, '14 surgery to remove  . Hypertension   . SBO (small  bowel obstruction) ([place]) 06/07/2013  . Thyroid disease    "goiter"  . Transfusion history    10 yrs+   Past Surgical History:  Procedure Laterality Date  . CARDIAC CATHETERIZATION  04/04/2010   No significant obstructive coronary artery disease  . CARDIOVERSION N/A 08/08/2017   Procedure: CARDIOVERSION;  Surgeon: Pixie Casino, MD;  Location: [facility];  Service: Cardiovascular;  Laterality: N/A;  . CHOLECYSTECTOMY  1990  . COLONOSCOPY W/ POLYPECTOMY    . COLONOSCOPY WITH PROPOFOL N/A 04/10/2014   Procedure: COLONOSCOPY WITH PROPOFOL;  Surgeon: Beryle Beams, MD;  Location: [facility];  Service: Endoscopy;  Laterality: N/A;  . DIAGNOSTIC LAPAROSCOPY     x2 bowel  obstructions(adhesions)  . 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 THYROIDECTOMY    . TUBAL LIGATION  1986   Social History   Socioeconomic History  . Marital status: Married    Spouse name: Not on file  . Number of children: 3  . Years of education: Not on file  . Highest education level: Not on file  Occupational History  . Occupation: Self employed Armed forces operational officer  Social Needs  . Financial resource strain: Not on file  . Food insecurity:    Worry: Not on file    Inability: Not on file  . Transportation needs:    Medical: Not on file    Non-medical: Not on file  Tobacco Use  . Smoking status: Never Smoker  . Smokeless tobacco: Never Used  Substance and Sexual Activity  . Alcohol use: No    Comment: wine occ  . Drug use: No    Types: Oxycodone    Comment: perscribed  . Sexual activity: Not Currently    Birth control/protection: None  Lifestyle  . Physical activity:    Days per week: Not on file    Minutes per session: Not on file  . Stress: Not on file  Relationships  . Social connections:    Talks on phone: Not on file    Gets together: Not on file    Attends religious service: Not on file    Active member of club or organization: Not on file     Attends meetings of clubs or organizations: Not on file    Relationship status: Not on file  . Intimate partner violence:    Fear of current or ex partner: Not on file    Emotionally abused: Not on file    Physically abused: Not on file    Forced sexual activity: Not on file  Other Topics Concern  . Not on file  Social History Narrative   ** Merged History Encounter **       Current Outpatient Medications on File Prior to Visit  Medication Sig Dispense Refill  . acetaminophen (TYLENOL) 650 MG CR tablet Take 1,300 mg by mouth daily.     Marland Kitchen apixaban (ELIQUIS) 5 MG TABS tablet Take 1 tablet (5 mg total) by mouth 2 (two) times daily. 60 tablet 6  . budesonide-formoterol (SYMBICORT) 160-4.5 MCG/ACT inhaler Inhale 2 puffs into the lungs 2 (two) times daily as needed (shortness of breath.).     Marland Kitchen calcitRIOL (ROCALTROL) 0.5 MCG capsule Take 1 capsule (0.5 mcg total) by mouth 2 (two) times daily. 60 capsule 2  . calcium carbonate (OS-CAL - DOSED IN MG OF ELEMENTAL CALCIUM) 1250 (500 Ca) MG tablet Take 1 tablet (1,250 mg total) by mouth 3 (three) times daily with meals. (Patient taking differently: Take 1,250 mg by mouth 3 (three) times daily. ) 90 tablet 0  . carvedilol (COREG) 3.125 MG tablet Take 1 tablet (3.125 mg total) by mouth 2 (two) times daily with a meal. 60 tablet 0  . Cyanocobalamin (VITAMIN B-12 IJ) Inject as directed. Monthly injections    . DULoxetine (CYMBALTA) 60 MG capsule Take 1 capsule (60 mg total) by mouth daily. 30 capsule 0  . eszopiclone (LUNESTA) 2 MG TABS tablet Take 2 mg by mouth at bedtime.  1  . furosemide (LASIX) 40 MG tablet Take 1 tablet (40 mg total) by mouth every morning. 30 tablet 11  . levothyroxine (SYNTHROID, LEVOTHROID) 200 MCG tablet Take  200 mcg by mouth daily before breakfast.    . LYRICA 200 MG capsule Take 200 mg by mouth 2 (two) times daily.   2  . magnesium oxide (MAG-OX) 400 (241.3 Mg) MG tablet Take 400 mg by mouth daily.  0  . meloxicam (MOBIC) 15  MG tablet Take 15 mg by mouth daily as needed (pain).     . methocarbamol (ROBAXIN) 500 MG tablet Take 500 mg by mouth at bedtime.  0  . mirabegron ER (MYRBETRIQ) 50 MG TB24 tablet Take 50 mg by mouth daily.    . OXYGEN Inhale 2 L/min into the lungs as needed (shortness of breath).     . senna (SENOKOT) 8.6 MG TABS tablet Take 1 tablet (8.6 mg total) by mouth daily. 120 each 0  . traZODone (DESYREL) 50 MG tablet Take 50 mg by mouth 2 (two) times daily.  0  . vitamin B-12 100 MCG tablet Take 1 tablet (100 mcg total) by mouth daily. 30 tablet 0  . Vitamin D, Ergocalciferol, (DRISDOL) 50000 units CAPS capsule Take 50,000 Units by mouth every 7 (seven) days.     No current facility-administered medications on file prior to visit.    Allergies  Allergen Reactions  . Norco [Hydrocodone-Acetaminophen] Itching   Family History  Problem Relation Age of Onset  . Diabetes Mother   . Epilepsy Mother   . Cancer Mother        Breast  . Hypertension Mother   . Breast cancer Mother   . Kidney disease Father   . Diabetes Father   . Hypertension Father   . Asthma Father   . Heart disease Father   . Epilepsy Sister   . Cancer Maternal Grandmother   . Breast cancer Maternal Grandmother   . Cancer Paternal Grandmother   . Breast cancer Paternal Grandmother     PE: There were no vitals taken for this visit. Wt Readings from Last 3 Encounters:  10/12/17 (!) 318 lb 12.8 oz (144.6 kg)  09/28/17 (!) 306 lb (138.8 kg)  09/21/17 (!) 313 lb (142 kg)   Constitutional: obese, in NAD Eyes: PERRLA, EOMI, no exophthalmos ENT: moist mucous membranes, no thyromegaly, no cervical lymphadenopathy Cardiovascular: RRR, No MRG, + lymphedema B legs Respiratory: CTA B Gastrointestinal: abdomen soft, NT, ND, BS+ Musculoskeletal: no deformities, strength intact in all 4 Skin: moist, warm, no rashes Neurological: no tremor with outstretched hands, DTR normal in all 4, [place] sign negative  Assessment: 1.  Papillary thyroid cancer  2. Postsurgical hypothyroidism  3.  Postsurgical  hypocalcemia  PLAN: 1. PTC  -Patient had an incidentally found focus of microscopic papillary thyroid cancer, which was discovered after her thyroidectomy for enlarging nontoxic multinodular goiter.  Since the cancer was so small, this was most likely cured by her thyroidectomy.   - No imaging follow-up is necessary, we will continue to follow her clinically. - she denies any compression symptoms other than chronic dysphagia  2. Patient with hypothyroidism developed after her thyroidectomy, on LT4, with poor control - - latest thyroid labs reviewed with pt >> suppressed 2 mo ago - she continues on LT4 200 mcg daily - we discussed about taking the thyroid hormone every day, with water, >30 minutes before breakfast, separated by >4 hours from acid reflux medications, calcium, iron, multivitamins. Pt. is taking it correctly. - will check thyroid tests today: TSH and fT4 - If labs are abnormal, she will need to return for repeat TFTs in 1.5 months  3.  Patient with postoperative, iatrogenic, hypocalcemia, uncontrolled.  Before last visit, he had a very low blood calcium of 5.5.  Vitamin D level was normal.  She does have a history of magnesium deficiency and high phosphorus.  After last visit, we started calcitriol 0.5 mcg twice a day.  Her calcium level improved and her magnesium and phosphorus normalized. -She has no apparent signs or symptoms for hypocalcemia at today's visit: No perioral numbness, no acral cramping, [place] sign negative -At this visit, will check her ionized and total calcium level, intact PTH, and will repeat magnesium, phosphorus, and calcitriol level.  I will also add another 25 hydroxy vitamin D. -We did discuss that Marcina Millard has been recalled recently due to contaminants so for now, will need to manage her with the above medicines. -We will continue her calcium and vitamin D supplementation for  now -I will see her back in 4 months  Philemon Kingdom, MD PhD [facility] Endocrinology

## 2017-11-27 ENCOUNTER — Encounter: Payer: Self-pay | Admitting: Internal Medicine

## 2017-11-27 ENCOUNTER — Ambulatory Visit (INDEPENDENT_AMBULATORY_CARE_PROVIDER_SITE_OTHER): Payer: Medicare Other | Admitting: Internal Medicine

## 2017-11-27 DIAGNOSIS — M949 Disorder of cartilage, unspecified: Secondary | ICD-10-CM | POA: Diagnosis not present

## 2017-11-27 DIAGNOSIS — E890 Postprocedural hypothyroidism: Secondary | ICD-10-CM

## 2017-11-27 DIAGNOSIS — M899 Disorder of bone, unspecified: Secondary | ICD-10-CM

## 2017-11-27 DIAGNOSIS — C73 Malignant neoplasm of thyroid gland: Secondary | ICD-10-CM | POA: Diagnosis not present

## 2017-11-27 LAB — MAGNESIUM: Magnesium: 1.6 mg/dL (ref 1.5–2.5)

## 2017-11-27 LAB — TSH: TSH: 1.21 u[IU]/mL (ref 0.35–4.50)

## 2017-11-27 LAB — VITAMIN D 25 HYDROXY (VIT D DEFICIENCY, FRACTURES): VITD: 45.84 ng/mL (ref 30.00–100.00)

## 2017-11-27 LAB — PHOSPHORUS: PHOSPHORUS: 3.8 mg/dL (ref 2.3–4.6)

## 2017-11-27 LAB — T4, FREE: Free T4: 1.41 ng/dL (ref 0.60–1.60)

## 2017-11-27 NOTE — Patient Instructions (Signed)
Please continue: - CaCO3 1500 mg 3x a day with meals - high-dose vitamin D 50,000 U weekly - calcitriol 0.5 mcg 2x a day  Please continue levothyroxine 200 mcg daily.  Take the thyroid hormone every day, with water, at least 30 minutes before breakfast, separated by at least 4 hours from: - acid reflux medications - calcium - iron - multivitamins  Please stop at the lab.  Please come back for a follow-up appointment in 6 months.

## 2017-11-27 NOTE — Progress Notes (Addendum)
Patient ID: Jillian Eaton, female   DOB: [birth year], 67 y.o.   MRN: [id number]    HPI  Jillian Eaton is a 67 y.o.-year-old female, initially referred by her PCP, Dr. Sabas Sous, returning for h/o thyroid microcarcinoma,  postsurgical hypothyroidism and postsurgical hypocalcemia. Last visit 4 mo ago.  She was admitted 3 times since last visit for atrial fibrillation/shortness of breath.  Since last visit, we started calcitriol 0.5 mcg twice daily.  I also advised her to start HCTZ 25 mg daily while reducing the Lasix to 50%.  Latest labs from 10/09/2017 were reviewed and her calcium was still low, while her phosphorus, magnesium were normal.  Vitamin D level was normal in 06/2017.   However, since then, HCTZ was stopped apparently by her nephrologist.  Her Lasix dose was reduced, also.  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 Fredirick Maudlin at [facility].  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  She developed postsurgical hypothyroidism and, unfortunately, also postsurgical hypocalcemia, both uncontrolled.  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 Benign hyperplastic thyroid tissue. See comment. COMMENT: The papillary carcinoma appears to be incidental.  On 06/19/2017 she developed atrial fibrillation.  At that time, TSH is very suppressed,  at 0.07.  She is on levothyroxine 200 mcg daily (dose decreased 07/2017 from 300 mcg daily -she only decrease it in 07/2017 despite advised to decrease it in 05/2017).  After the last TSH returned, she was advised to reduce the dose to 175 mcg daily but she did not do so yet.  She takes the levothyroxine: - ~ 5 am - fasting - at  least 30 min from b'fast - no MVI, PPIs - + Fe -at 8:30 AM.  She has a history of iron infusions and she restarted these  -had 2 infusions already. - + Ca -first dose at 8:30 AM - not on Biotin  TFTs remain uncontrolled: Lab Results  Component Value Date   TSH 0.034 (L) 09/21/2017   TSH 1.48 08/03/2017   TSH 0.007 (L) 06/19/2017   TSH 0.046 (L) 08/29/2016   TSH 9.239 (H) 04/25/2016   TSH 68.907 (H) 03/10/2016   FREET4 2.87 (H) 09/21/2017   FREET4 1.04 08/03/2017   FREET4 2.43 (H) 06/19/2017   FREET4 1.04 08/29/2016   Pt denies: - feeling nodules in neck - hoarseness - choking - SOB with lying down  But has chronic dysphagia.  She has no FH of thyroid disorders. No FH of thyroid cancer. No h/o radiation tx to head or neck.  No herbal supplements. No Biotin use. No recent steroids use.   Hypocalcemia:  Iatrogenic, after thyroid surgery, despite the fact that no parathyroid glands were resected.  This is uncontrolled.  She was admitted with hypocalcemia and acute respiratory failure on 07/10/2017.  Reviewed pertinent labs -interestingly, PTH is normal, possibly related to her CKD: 10/09/2017: Ca 8.1 (8.7-10.3) Lab Results  Component Value Date   PTH 44 08/03/2017   PTH 65 07/10/2017   PTH Comment 07/10/2017   CALCIUM 8.6 (L) 09/21/2017   CALCIUM 8.5 (L) 09/18/2017   CALCIUM 5.7 (LL) 08/03/2017   CALCIUM 6.9 (L) 07/17/2017   CALCIUM 6.7 (L) 07/16/2017   CALCIUM 6.2 (LL) 07/15/2017   CALCIUM 6.0 (LL) 07/14/2017   CALCIUM 5.8 (  LL) 07/13/2017   CALCIUM 5.9 (LL) 07/12/2017   CALCIUM 6.1 (LL) 07/11/2017   She has a history of low magnesium and high phosphorus-both normal at last check: 10/09/2017: Mg 2.0 (1.6-2.3) Lab Results  Component Value Date   MG 1.5 08/03/2017   MG 2.1 07/15/2017   MG 1.6 (L) 07/14/2017   MG 1.3 (L) 07/13/2017   MG 1.2 (L) 07/10/2017   MG 1.8 07/11/2016   MG 2.2 07/09/2016   MG 1.6 (L) 07/09/2016   MG 1.9 07/08/2016   MG 2.0  07/08/2016   MG 1.6 (L) 07/08/2016   MG 1.6 (L) 05/03/2016   MG 1.8 04/25/2016   MG 1.9 04/25/2016   MG 2.0 04/24/2016   MG 1.2 (L) 04/23/2016   MG 2.2 03/12/2016   MG 1.4 (L) 03/10/2016   10/09/2017: phos 4.5 (2.5-4.5) Lab Results  Component Value Date   PHOS 6.3 (H) 08/03/2017   PHOS 7.4 (H) 07/10/2017   PHOS 3.6 07/11/2016   PHOS 4.0 07/09/2016   PHOS 3.5 07/09/2016   PHOS 5.2 (H) 07/08/2016   PHOS 6.6 (H) 07/08/2016   PHOS 8.0 (H) 07/08/2016   PHOS 3.6 04/25/2016   PHOS 3.5 04/25/2016   PHOS 3.3 04/24/2016   PHOS 3.7 03/12/2016   PHOS 6.2 (H) 03/10/2016    She is on: - CaCO3 1500 mg 3x a day with meals  - high-dose vitamin D 50,000 U weekly - calcitriol 0.5 mcg 2x a day  We started HCTZ 25 mg daily.  In the meantime, we reduced her Lasix to 20-40 mg daily (50% of her previous dose). Since then, her cardiologist or her nephrologist (she cannot remember) told her to stop it.  She denies hand cramping or perioral numbness.  She has a history of vitamin D deficiency.  Latest level normal: Lab Results  Component Value Date   VD25OH 43.9 07/14/2017   Of note, she was taken off amiodarone 400 mg daily since last visit.  + CKD. Last BUN/Cr: Lab Results  Component Value Date   BUN 27 (H) 09/21/2017   CREATININE 1.37 (H) 09/21/2017   Since last visit, she had a torn rotator cuffs in both shoulders.  She is on hydrocodone for pain in her shoulders.  ROS: Constitutional: + weight loss, no fatigue, + hot flashes, no subjective hypothermia Eyes: no blurry vision, no xerophthalmia ENT: no sore throat, + see HPI Cardiovascular: no CP/no SOB/no palpitations/no leg swelling Respiratory: no cough/no SOB/no wheezing Gastrointestinal: no N/no V/no D/no C/no acid reflux Musculoskeletal: + muscle aches/+ joint aches Skin: no rashes, no hair loss Neurological: no tremors/no numbness/no tingling/no dizziness  I reviewed pt's medications, allergies, PMH, social hx, family hx,  and changes were documented in the history of present illness. Otherwise, unchanged from my initial visit note.  Past Medical History:  Diagnosis Date  . Anemia   . Asthma   . CHF (congestive heart failure) ([place])   . Chronic headache   . DOE (dyspnea on exertion)    2D ECHO, 02/12/2012 - EF 60-65%, moderate concentric hypertrophy  . Fibromyalgia    nerve pain"left side at waist level" "can't lay on that side without pain" , "HOB elevation helps"  . Hematuria - cause not known   . History of kidney stones    x 2 '13, '14 surgery to remove  . Hypertension   . SBO (small bowel obstruction) ([place]) 06/07/2013  . Thyroid disease    "goiter"  . Transfusion history    10  yrs+   Past Surgical History:  Procedure Laterality Date  . CARDIAC CATHETERIZATION  04/04/2010   No significant obstructive coronary artery disease  . CARDIOVERSION N/A 08/08/2017   Procedure: CARDIOVERSION;  Surgeon: Pixie Casino, MD;  Location: [facility];  Service: Cardiovascular;  Laterality: N/A;  . CHOLECYSTECTOMY  1990  . COLONOSCOPY W/ POLYPECTOMY    . COLONOSCOPY WITH PROPOFOL N/A 04/10/2014   Procedure: COLONOSCOPY WITH PROPOFOL;  Surgeon: Beryle Beams, MD;  Location: [facility];  Service: Endoscopy;  Laterality: N/A;  . DIAGNOSTIC LAPAROSCOPY     x2 bowel obstructions(adhesions)  . 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 THYROIDECTOMY    . TUBAL LIGATION  1986   Social History   Socioeconomic History  . Marital status: Married    Spouse name: Not on file  . Number of children: 3  . Years of education: Not on file  . Highest education level: Not on file  Occupational History  . Occupation: Self employed Armed forces operational officer  Social Needs  . Financial resource strain: Not on file  . Food insecurity:    Worry: Not on file    Inability: Not on file  . Transportation needs:    Medical: Not on file    Non-medical: Not on file  Tobacco Use  . Smoking  status: Never Smoker  . Smokeless tobacco: Never Used  Substance and Sexual Activity  . Alcohol use: No    Comment: wine occ  . Drug use: No    Types: Oxycodone    Comment: perscribed  . Sexual activity: Not Currently    Birth control/protection: None  Lifestyle  . Physical activity:    Days per week: Not on file    Minutes per session: Not on file  . Stress: Not on file  Relationships  . Social connections:    Talks on phone: Not on file    Gets together: Not on file    Attends religious service: Not on file    Active member of club or organization: Not on file    Attends meetings of clubs or organizations: Not on file    Relationship status: Not on file  . Intimate partner violence:    Fear of current or ex partner: Not on file    Emotionally abused: Not on file    Physically abused: Not on file    Forced sexual activity: Not on file  Other Topics Concern  . Not on file  Social History Narrative   ** Merged History Encounter **       Current Outpatient Medications on File Prior to Visit  Medication Sig Dispense Refill  . acetaminophen (TYLENOL) 650 MG CR tablet Take 1,300 mg by mouth daily.     Marland Kitchen apixaban (ELIQUIS) 5 MG TABS tablet Take 1 tablet (5 mg total) by mouth 2 (two) times daily. 60 tablet 6  . budesonide-formoterol (SYMBICORT) 160-4.5 MCG/ACT inhaler Inhale 2 puffs into the lungs 2 (two) times daily as needed (shortness of breath.).     Marland Kitchen calcitRIOL (ROCALTROL) 0.5 MCG capsule Take 1 capsule (0.5 mcg total) by mouth 2 (two) times daily. 60 capsule 2  . calcium carbonate (OS-CAL - DOSED IN MG OF ELEMENTAL CALCIUM) 1250 (500 Ca) MG tablet Take 1 tablet (1,250 mg total) by mouth 3 (three) times daily with meals. (Patient taking differently: Take 1,250 mg by mouth 3 (three) times daily. ) 90 tablet 0  . carvedilol (  COREG) 3.125 MG tablet Take 1 tablet (3.125 mg total) by mouth 2 (two) times daily with a meal. 60 tablet 0  . Cyanocobalamin (VITAMIN B-12 IJ) Inject as  directed. Monthly injections    . DULoxetine (CYMBALTA) 60 MG capsule Take 1 capsule (60 mg total) by mouth daily. 30 capsule 0  . eszopiclone (LUNESTA) 2 MG TABS tablet Take 2 mg by mouth at bedtime.  1  . furosemide (LASIX) 40 MG tablet Take 1 tablet (40 mg total) by mouth every morning. 30 tablet 11  . levothyroxine (SYNTHROID, LEVOTHROID) 200 MCG tablet Take 200 mcg by mouth daily before breakfast.    . LYRICA 200 MG capsule Take 200 mg by mouth 2 (two) times daily.   2  . magnesium oxide (MAG-OX) 400 (241.3 Mg) MG tablet Take 400 mg by mouth daily.  0  . meloxicam (MOBIC) 15 MG tablet Take 15 mg by mouth daily as needed (pain).     . methocarbamol (ROBAXIN) 500 MG tablet Take 500 mg by mouth at bedtime.  0  . mirabegron ER (MYRBETRIQ) 50 MG TB24 tablet Take 50 mg by mouth daily.    . OXYGEN Inhale 2 L/min into the lungs as needed (shortness of breath).     . senna (SENOKOT) 8.6 MG TABS tablet Take 1 tablet (8.6 mg total) by mouth daily. 120 each 0  . traZODone (DESYREL) 50 MG tablet Take 50 mg by mouth 2 (two) times daily.  0  . vitamin B-12 100 MCG tablet Take 1 tablet (100 mcg total) by mouth daily. 30 tablet 0  . Vitamin D, Ergocalciferol, (DRISDOL) 50000 units CAPS capsule Take 50,000 Units by mouth every 7 (seven) days.     No current facility-administered medications on file prior to visit.    Allergies  Allergen Reactions  . Norco [Hydrocodone-Acetaminophen] Itching   Family History  Problem Relation Age of Onset  . Diabetes Mother   . Epilepsy Mother   . Cancer Mother        Breast  . Hypertension Mother   . Breast cancer Mother   . Kidney disease Father   . Diabetes Father   . Hypertension Father   . Asthma Father   . Heart disease Father   . Epilepsy Sister   . Cancer Maternal Grandmother   . Breast cancer Maternal Grandmother   . Cancer Paternal Grandmother   . Breast cancer Paternal Grandmother     PE: BP 108/70   Pulse (!) 102   Ht 5' 5"  (1.651 m)   Wt  294 lb (133.4 kg)   SpO2 93%   BMI 48.92 kg/m  Wt Readings from Last 3 Encounters:  11/27/17 294 lb (133.4 kg)  10/12/17 (!) 318 lb 12.8 oz (144.6 kg)  09/28/17 (!) 306 lb (138.8 kg)   Constitutional: Obese, in NAD Eyes: PERRLA, EOMI, no exophthalmos ENT: moist mucous membranes, no thyromegaly, no cervical lymphadenopathy Cardiovascular: No tachycardia at the time of the physical exam, irregularly irregular rhythm, No MRG, + B lymphedema Respiratory: CTA B Gastrointestinal: abdomen soft, NT, ND, BS+ Musculoskeletal: no deformities, strength intact in all 4 Skin: moist, warm, no rashes Neurological: no tremor with outstretched hands, DTR normal in all 4, Chvostek sign negative  Assessment: 1. Papillary thyroid cancer  2. Postsurgical hypothyroidism  3.  Postsurgical  hypocalcemia  PLAN: 1. PTC  Patient had an incidentally found focus of microscopic papillary thyroid cancer, which was discovered after her thyroidectomy for enlarging nontoxic multinodular goiter.  Since  the cancer was so small, this was most likely cured by her thyroidectomy. -No imaging follow-up is necessary, will continue to follow her clinically -She denies any neck compression symptoms other than chronic dysphagia  2. Patient with hypothyroidism developed after her thyroidectomy, on levothyroxine, with poor control -Latest thyroid labs reviewed with patient: TSH suppressed 2 months ago -She continues levothyroxine 200 mcg daily >> we will most likely need to decrease the dose to 175 mcg daily after the results are back - we discussed about taking the thyroid hormone every day, with water, >30 minutes before breakfast, separated by >4 hours from acid reflux medications, calcium, iron, multivitamins. Pt. is taking it correctly. - will check thyroid tests today: TSH and fT4 - If labs are abnormal, she will need to return for repeat TFTs in 1.5 months  3. Patient with postoperative, iatrogenic,  hypocalcemia-uncontrolled -Before last visit, she had a very low blood calcium of 5.5.  Vitamin D level was normal.  She does have a history of magnesium deficiency and high phosphorus.  After last visit, we started calcitriol 0.5 mcg twice a day.  Her calcium level improved and her magnesium and phosphorus normalized. -She has no apparent signs or symptoms for hypocalcemia at today's visit: No perioral numbness, no acral cramping, Chvostek sign negative -At this visit, we will check her ionized and total calcium level, intact PTH, and will repeat magnesium, phosphorus, and calcitriol level.  I will also add another 25 hydroxy vitamin D. -We will continue her calcium and vitamin D supplementation for now -We did discuss about the fact that Jillian Eaton has been recalled recently due to contaminants, so, for now, we need to manage her with the above medicines. -I will see her back in 6 months.  Office Visit on 11/27/2017  Component Date Value Ref Range Status  . TSH 11/27/2017 1.21  0.35 - 4.50 uIU/mL Final  . Free T4 11/27/2017 1.41  0.60 - 1.60 ng/dL Final   Comment: Specimens from patients who are undergoing biotin therapy and /or ingesting biotin supplements may contain high levels of biotin.  The higher biotin concentration in these specimens interferes with this Free T4 assay.  Specimens that contain high levels  of biotin may cause false high results for this Free T4 assay.  Please interpret results in light of the total clinical presentation of the patient.    . Magnesium 11/27/2017 1.6  1.5 - 2.5 mg/dL Final  . Phosphorus 11/27/2017 3.8  2.3 - 4.6 mg/dL Final  . Glucose, Bld 11/27/2017 81  65 - 99 mg/dL Final   Comment: .            Fasting reference interval .   . BUN 11/27/2017 29* 7 - 25 mg/dL Final  . Creat 11/27/2017 1.01* 0.50 - 0.99 mg/dL Final   Comment: For patients >21 years of age, the reference limit for Creatinine is approximately 13% higher for people identified as  African-American. .   . GFR, Est Non African American 11/27/2017 58* > OR = 60 mL/min/1.63m Final  . GFR, Est African American 11/27/2017 68  > OR = 60 mL/min/1.747mFinal  . BUN/Creatinine Ratio 11/27/2017 29* 6 - 22 (calc) Final  . Sodium 11/27/2017 142  135 - 146 mmol/L Final  . Potassium 11/27/2017 4.4  3.5 - 5.3 mmol/L Final  . Chloride 11/27/2017 101  98 - 110 mmol/L Final  . CO2 11/27/2017 30  20 - 32 mmol/L Final  . Calcium 11/27/2017 8.9  8.6 -  10.4 mg/dL Final  . VITD 11/27/2017 45.84  30.00 - 100.00 ng/mL Final  . Calcium, Ion 11/27/2017 4.78* 4.8 - 5.6 mg/dL Final  . PTH 11/27/2017 40  15 - 65 pg/mL Final   Msg sent: Dear Ms. Qualls, All your labs look much better! Thyroid tests are now normal, kidney test has improved, your ionized calcium is almost normal, vitamin D is normal, and also magnesium and phosphorus levels are normal.  The only test that has not returned is a calcitriol level.  I will let you know as soon as this comes back.  However, so far, everything looks excellent! Sincerely, Philemon Kingdom MD  Component     Latest Ref Rng & Units 11/27/2017  Vitamin D 1, 25 (OH) Total     18 - 72 pg/mL 54  Vitamin D3 1, 25 (OH)     pg/mL 30  Vitamin D2 1, 25 (OH)     pg/mL 24  Calcitriol also normal.  Results: DXA  - [facility] (12/10/2017) Lumbar spine L1-L4 (L2) Femoral neck (FN) 33% distal left radius Ultra distal left radius  T-score -1.6 RFN: N/a LFN: -2.8  -2.3  -3.5   Assessment: Patient has OSTEOPOROSIS according to the [facility] classification for osteoporosis (see below). Fracture risk: high Comments: the technical quality of the study is good, however, the right femoral neck could not be analyzed as the patient could not internally rotate the right hip.  The left radius was analyzed instead.  Also, L2 vertebra had to be excluded from analysis due to degenerative changes.  Pt has OP >> would suggest to start Prolia - need to be careful with  hypocalcemia.  Philemon Kingdom, MD PhD [facility] Endocrinology

## 2017-11-28 ENCOUNTER — Inpatient Hospital Stay: Admission: RE | Admit: 2017-11-28 | Payer: Medicare Other | Source: Ambulatory Visit

## 2017-11-28 LAB — PARATHYROID HORMONE, INTACT (NO CA): PTH: 40 pg/mL (ref 15–65)

## 2017-11-29 DIAGNOSIS — M12819 Other specific arthropathies, not elsewhere classified, unspecified shoulder: Secondary | ICD-10-CM | POA: Insufficient documentation

## 2017-11-29 DIAGNOSIS — M75100 Unspecified rotator cuff tear or rupture of unspecified shoulder, not specified as traumatic: Secondary | ICD-10-CM | POA: Insufficient documentation

## 2017-11-29 DIAGNOSIS — S46011D Strain of muscle(s) and tendon(s) of the rotator cuff of right shoulder, subsequent encounter: Secondary | ICD-10-CM | POA: Diagnosis not present

## 2017-11-29 DIAGNOSIS — M19011 Primary osteoarthritis, right shoulder: Secondary | ICD-10-CM | POA: Diagnosis not present

## 2017-11-30 ENCOUNTER — Encounter: Payer: Self-pay | Admitting: Internal Medicine

## 2017-11-30 ENCOUNTER — Telehealth: Payer: Self-pay | Admitting: Internal Medicine

## 2017-11-30 ENCOUNTER — Telehealth: Payer: Self-pay | Admitting: *Deleted

## 2017-11-30 ENCOUNTER — Other Ambulatory Visit: Payer: Self-pay | Admitting: Internal Medicine

## 2017-11-30 DIAGNOSIS — M949 Disorder of cartilage, unspecified: Secondary | ICD-10-CM

## 2017-11-30 DIAGNOSIS — M899 Disorder of bone, unspecified: Secondary | ICD-10-CM

## 2017-11-30 NOTE — Telephone Encounter (Signed)
OK, I reordered it.  Please give her a call and tell her about where she needs to be for this [facility]) and also let us schedule this several days in advance so she can have time to come off her calcium and have somebody, help her on and off the table (please see patient's message to me).

## 2017-11-30 NOTE — Telephone Encounter (Signed)
   [facility] Pre-operative Risk Assessment    Request for surgical clearance:  1. What type of surgery is being performed? Right shoulder reverse total shoulder   2. When is this surgery scheduled? TBD  3. What type of clearance is required (medical clearance vs. Pharmacy clearance to hold med vs. Both)? Both  4. Are there any medications that need to be held prior to surgery and how long? Eliquis   5. Practice name and name of physician performing surgery? [facility]   6. What is your office phone number: [phone]    7.   What is your office fax number [phone]  8.   Anesthesia type (None, local, MAC, general) ? Choice/ISB   Austin Miles 11/30/2017, 2:36 PM  _________________________________________________________________   (provider comments below)

## 2017-11-30 NOTE — Telephone Encounter (Signed)
Pharm please address Eliquis thanks °

## 2017-11-30 NOTE — Telephone Encounter (Signed)
Pt need new order put in system for her Dexa scan she missed. Pt tried to called and r/s but office wouldn't allow her to w/o new order.

## 2017-12-02 LAB — CALCIUM, IONIZED: Calcium, Ion: 4.78 mg/dL — ABNORMAL LOW (ref 4.8–5.6)

## 2017-12-02 LAB — BASIC METABOLIC PANEL WITH GFR
BUN / CREAT RATIO: 29 (calc) — AB (ref 6–22)
BUN: 29 mg/dL — AB (ref 7–25)
CO2: 30 mmol/L (ref 20–32)
CREATININE: 1.01 mg/dL — AB (ref 0.50–0.99)
Calcium: 8.9 mg/dL (ref 8.6–10.4)
Chloride: 101 mmol/L (ref 98–110)
GFR, EST AFRICAN AMERICAN: 68 mL/min/{1.73_m2} (ref >=60)
GFR, Est Non African American: 58 mL/min/{1.73_m2} — ABNORMAL LOW (ref >=60)
Glucose, Bld: 81 mg/dL (ref 65–99)
Potassium: 4.4 mmol/L (ref 3.5–5.3)
Sodium: 142 mmol/L (ref 135–146)

## 2017-12-02 LAB — VITAMIN D 1,25 DIHYDROXY
Vitamin D 1, 25 (OH)2 Total: 54 pg/mL (ref 18–72)
Vitamin D2 1, 25 (OH)2: 24 pg/mL
Vitamin D3 1, 25 (OH)2: 30 pg/mL

## 2017-12-03 NOTE — Telephone Encounter (Signed)
See MyChart message

## 2017-12-03 NOTE — Telephone Encounter (Signed)
MyChart message sent to patient to see if she has any day and time preferences so we can set her up another one.

## 2017-12-03 NOTE — Telephone Encounter (Signed)
Patient with diagnosis of Afib on Eliquis for anticoagulation.    Procedure: right shoulder reverse total shoulder Date of procedure: TBD  CHADS2-VASc score of  4 (CHF, HTN, AGE, DM2, stroke/tia x 2, CAD, AGE, female)  CrCl 139ml/min  Per office protocol, patient can hold Eliquis for 1-2 days prior to procedure.

## 2017-12-10 ENCOUNTER — Ambulatory Visit (INDEPENDENT_AMBULATORY_CARE_PROVIDER_SITE_OTHER)
Admission: RE | Admit: 2017-12-10 | Discharge: 2017-12-10 | Disposition: A | Discharge status: Home / Self Care | Payer: Medicare Other | Source: Ambulatory Visit | Attending: Internal Medicine | Admitting: Internal Medicine

## 2017-12-10 DIAGNOSIS — M899 Disorder of bone, unspecified: Secondary | ICD-10-CM | POA: Diagnosis not present

## 2017-12-10 DIAGNOSIS — M949 Disorder of cartilage, unspecified: Secondary | ICD-10-CM

## 2017-12-10 NOTE — Telephone Encounter (Signed)
   Primary Cardiologist:Kenneth C Hilty, MD  Chart reviewed as part of pre-operative protocol coverage. Because of Jillian Eaton's past medical history and time since last visit, he/she will require a follow-up visit in order to better assess preoperative cardiovascular risk. This is already scheduled for 12/13/17. She was seen in the office 10/12/17 and was 12lb up with worsening renal function. Would be best served with in-person eval rather than phone clearance.  Pre-op covering staff: - Please schedule appointment and call patient to inform them to discuss pre-op clearance at f/u visit (she sent mychart msg looking for update) - Add appointment modifier for pre-op clearance -I will fax this note to requesting surgeon's office to make them aware  Per usual protocol, MD will need to route completed note indicating plan for clearance at time of OV. Will route to Dr. [name] as [place] for 10/24. Will remove this msg from pre-op pool.  Charlie Pitter, PA-C  12/10/2017, 2:24 PM

## 2017-12-10 NOTE — Telephone Encounter (Signed)
Spoke with pt re: surgical clearance. Pt has been made aware that she is scheduled for f/u 12/13/17, and that the clearance will be addressed at that time Pt thanked me for the follow-up call.

## 2017-12-11 ENCOUNTER — Ambulatory Visit: Payer: Medicare Other

## 2017-12-11 ENCOUNTER — Encounter: Payer: Self-pay | Admitting: Internal Medicine

## 2017-12-11 ENCOUNTER — Ambulatory Visit
Admission: RE | Admit: 2017-12-11 | Discharge: 2017-12-11 | Disposition: A | Discharge status: Home / Self Care | Payer: Medicare Other | Source: Ambulatory Visit | Attending: Internal Medicine | Admitting: Internal Medicine

## 2017-12-11 DIAGNOSIS — Z1231 Encounter for screening mammogram for malignant neoplasm of breast: Secondary | ICD-10-CM

## 2017-12-12 DIAGNOSIS — M62830 Muscle spasm of back: Secondary | ICD-10-CM | POA: Diagnosis not present

## 2017-12-12 DIAGNOSIS — Z131 Encounter for screening for diabetes mellitus: Secondary | ICD-10-CM | POA: Diagnosis not present

## 2017-12-12 DIAGNOSIS — D519 Vitamin B12 deficiency anemia, unspecified: Secondary | ICD-10-CM | POA: Diagnosis not present

## 2017-12-12 DIAGNOSIS — R7309 Other abnormal glucose: Secondary | ICD-10-CM | POA: Diagnosis not present

## 2017-12-13 ENCOUNTER — Telehealth: Payer: Self-pay

## 2017-12-13 ENCOUNTER — Encounter: Payer: Self-pay | Admitting: Internal Medicine

## 2017-12-13 ENCOUNTER — Other Ambulatory Visit: Payer: Self-pay | Admitting: Internal Medicine

## 2017-12-13 ENCOUNTER — Ambulatory Visit (INDEPENDENT_AMBULATORY_CARE_PROVIDER_SITE_OTHER): Payer: Medicare Other | Admitting: Internal Medicine

## 2017-12-13 VITALS — BP 124/88 | HR 93 | Ht 65.0 in | Wt 304.2 lb

## 2017-12-13 DIAGNOSIS — I5032 Chronic diastolic (congestive) heart failure: Secondary | ICD-10-CM

## 2017-12-13 DIAGNOSIS — E039 Hypothyroidism, unspecified: Secondary | ICD-10-CM

## 2017-12-13 DIAGNOSIS — M810 Age-related osteoporosis without current pathological fracture: Secondary | ICD-10-CM | POA: Insufficient documentation

## 2017-12-13 DIAGNOSIS — Z01810 Encounter for preprocedural cardiovascular examination: Secondary | ICD-10-CM | POA: Diagnosis not present

## 2017-12-13 DIAGNOSIS — I4819 Other persistent atrial fibrillation: Secondary | ICD-10-CM | POA: Diagnosis not present

## 2017-12-13 MED ORDER — APIXABAN 5 MG PO TABS: 5.0000 mg | ORAL_TABLET | Freq: Two times a day (BID) | ORAL | 3 refills | Status: DC

## 2017-12-13 NOTE — Telephone Encounter (Signed)
Thank you!  Lisa

## 2017-12-13 NOTE — Progress Notes (Signed)
OFFICE NOTE  Chief Complaint:  Preoperative risk assessment  Primary Care Physician: [name], MD  HPI:  Jillian Eaton is a 67 year old female I saw a few weeks ago with a history of super morbid obesity, fibromyalgia and increasing shortness of breath. She had a cath in 2012 which showed normal coronaries, mildly elevating filling pressures, and diastolic pressures with a mean PA of 31. This correlated with her echocardiogram when RV systolic pressures in 6789 were 49 on echo. A repeat echocardiogram was just performed which demonstrated a preserved LVEF of 60-65% with moderate concentric LVH. There was mild to moderate increase in pulmonary artery pressure with peak at 51, which has not significantly changed from her study 1 year ago. Although the pressures look very similar her shortness of breath has been increasing significantly, and I recommended that she wear oxygen at night since she had that at home which does seem to be helping her at least feel better during the day as I suspect she has sleep apnea. She has been tested before which was apparently negative and is considering retesting at some point in the future. Overall I think the main issue obviously is weight, and she unfortunately says that she is not a candidate for gastric bypass and therefore it is a very difficult situation.  I referred her back to her pulmonologist in [facility] for ongoing evaluation of pulmonary hypertension. She tells me that she was then referred to [facility] and saw a specialist there who did another right heart catheterization but did not recommend any medications other than better blood pressure control.   In addition she had problems with kidney stones and underwent 2 operations regarding tthis.  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  She is now dependent on thyroid medication. Unfortunately she's not been able to lose weight, but her weight  is fairly stable around 400 pounds as is her shortness of breath.  Cath in 2012:  LEFT HEART CATHETERIZATION  OPERATOR: Mali Delmar Dondero, MD, and Rolland Porter, MD  INDICATION: Dyspnea on exertion and chest pressure.  HISTORY OF PRESENT ILLNESS: Jillian Eaton is a morbidly obese (BMI greater than 55) female with a history of failed gastric bypass x2 with an increasing weight gain and increasing shortness of breath as well as new- onset dyspnea on exertion. She reports that she can only walk about 10 feet before she becomes short of breath and has chest pressure which she says get better with rest. She has numerous cardiac risk factors and given the high likelihood of false positive stress test, I have referred her for cardiac catheterization, both left and right heart as she has had elevated RVSP on echocardiogram of approximately 30 to 31 mmHg.  PROCEDURE: The patient was brought into the cardiac catheterization lab, sterilely prepped and draped in the usual fashion. The area around the right femoral artery and right brachial vein were cleansed and draped to allow an attempt at radial arterial and brachiocephalic venous access. IV was not able to be obtained prior to the procedure given her body habitus. With difficulty in assessing the vein, the ultrasound was eventually used to identify the right brachiocephalic vein, however, cannulation with needle and wire was not possible as the vein was very small in caliber. After mild local bleeding was controlled, we did turn our attention to the right femoral vein and with great difficulty using the ultrasound, the right femoral vein was accessed by  Dr. Ellyn Hack using a straight wire and a needle. After venous access was obtained, the attention was turned to the right radial artery by Dr. Ellyn Hack and simultaneous to that right heart catheterization was performed by myself without any difficulty. The right radial artery was  successfully cannulated and subsequently left coronary artery system was selectively injected with a 5-French TIG 4.0 catheter, however the right coronary artery could not be cannulated with the TIG catheter and was eventually cannulated with a JR-4 catheter. LV pressure was measured with a pigtail catheter. Estimated blood loss was about 30 mL. There were no acute complications. The patient received a total of 9 mg of Versed throughout the procedure as well as 225 mcg of fentanyl and was at no point greater than moderately sedated. She received 5000 units of heparin about 10 mL of a radial cocktail.  FINDINGS: 1. Left main - short, no disease. 2. LAD - no significant disease. 3. Left circumflex, no significant disease. 4. RCA - dominant, no disease, large-caliber vessel. 5. LVEDP = 20 mmHg. 6. RA - 12. 7. RV 38/12. 8. PA - 43/19 (31). 9. PCWP - 24. 10.TPG - 7. 11.Fick cardiac output/Fick cardiac index - 10.56/3.84. 12.Thermodilution cardiac output/thermodilution cardiac index -  6.78/2.47. 13.Aortic saturation - 94%. 14.PA saturation - 68%.  IMPRESSION: 1. No significant obstructive coronary artery disease. 2. LVEDP = 20 mmHg. 3. Borderline pulmonary venous hypertension. 4. High cardiac output.  Jillian Eaton returns today for follow-up. She reports that her shortness of breath has not significantly worsened, in fact, possibly is slightly better. She did have thyroid surgery last year at [facility] and apparently underwent right heart catheterization prior to that. I do not have those records immediately available. Unfortunately she continues to maintain her weight and has not been able to lose any. She is complaining of some numbness and tingling in her feet which is likely neuropathy. She is on medication including Lyrica which she takes for fibromyalgia.  Jillian Eaton returns today for follow-up. She recently is been having more shortness of breath  and lower extremity swelling. She pointed out edema in her legs with very small blisters and some chronic venous stasis changes. She is not currently on a diuretic. She recently saw another new primary care provider with the [facility]. She was noted to be started on lisinopril 40 mg daily, which she is taking in addition to losartan 100 mg daily. The notes do not indicate from her office visit why she was started on lisinopril, but I can see through care everywhere that this was ordered by her primary care provider. She also takes amlodipine for blood pressure control. Her blood pressure was elevated initially at 178/94, but after resting came down to 120/78 and is at goal today. It is unusual, however to use both ACE inhibitors and ARBs.  07/02/2015  Jillian Eaton returns today for follow-up. She underwent a repeat echocardiogram for progressive dyspnea and exertion which showed a preserved LVEF of 6065% however there is moderate to severe pulmonary hypertension with an RVSP of 64 mmHg. This is increased about 10 mmHg compared to her prior study. Her mean pulmonary pressure by catheterization in 2012 was only 31 mmHg. I suspect that progressive pulmonary hypertension as a cause of her worsening shortness of breath. In fact, during recent hospitalization for surgery she required discharged with oxygen. She says she rarely uses oxygen at home but notes that she is hypoxic often when she checks her  oxygen levels. At her last office visit I recommended Lasix which she has been using sparingly. She has problems with incontinence and she does report an improvement in her swelling with it but does not notice significant change in her shortness of breath.  06/06/2016  Jillian Eaton returns from hospital follow-up. She was just admitted after an admission in January for unintentional narcotic overdose. She had unresponsiveness and possible aspiration. There was a second admission for a similar presentation  with respiratory failure, fever, sepsis and ultimately she was found to have a new onset cardiomyopathy with EF as low as 25%. Was felt that this was nonischemic. She was having intermittent atrial tachycardia and was felt that this might need to be managed with antiarrhythmic therapy however it seems to have resolved with carvedilol. She was placed on diuretics and reports that her breathing is close to baseline. She remains hypoxic with an O2 saturation 93% however was on home oxygen is result of her severe pulmonary hypertension. She does not feel that she needs to use the oxygen very regularly. From a heart failure standpoint she is on carvedilol, aspirin, furosemide and not currently on an ACE inhibitor, ARB or Entresto. Recent testing a renal function shows normal creatinine.  10/11/2016  Jillian Eaton is seen today in follow-up. In July she was hospitalized for hyperkalemia. She is on supplemental potassium and lisinopril, both were stopped. Fortunately her echo has improved back to normal recently. Overall she feels well. Her weight is down about 40 pounds of the recently she gained about 20 pounds back. She is working on that right as we speak.  04/03/2017  Jillian Eaton was seen today in follow-up.  She recently called in and had complaints of worsening shortness of breath.  She saw her pulmonary doctor who did an x-ray noted a mild right upper lobe infiltrate versus edema.  Labs were obtained including BNP which was only mildly elevated at 110.  She was given 20 mg of Lasix with no benefit and then recently was advised over the phone to increase her Lasix to 40 mg for 2 days.  She urinated quite a bit and lost about 5 pounds of what she feels was water weight.  She reports mild improvement in her breathing.  She is quite anxious about what might have led to this fluid gain.  Given her history of cardiomyopathy, it is possible she could have had some recurrent cardiomyopathy.  She reports stable diet and  no worsening abuse of sodium.  06/19/2017  Jillian Eaton returns today for follow-up.  She was supposed to have a limited echocardiogram but that never happened.  Approximately 2 to 3 weeks ago she called the office reporting palpitations.  We try to schedule an earlier appointment, but she ended up going to [facility] for her [facility].  On the flight she apparently became short of breath had some left flank pain and was hypoxic.  She was given oxygen and after landing they took her to the [facility] in [place].  She was treated there and found to be in A. fib with RVR.  This is a new finding.  She had a remote history of either PAT or PAF, but so brief that she was not anticoagulated.  She was then started on Xarelto.  She tells me she was given Lasix and diuresed.  She returns today for follow-up and still reports some shortness of breath.  Weight is about 6 pounds heavier than she was 2 months  ago.  She is in A. fib persistently with heart rates in the low 100s.  In addition she reports after starting the Xarelto that she has been having some vaginal bleeding.  07/26/2017  Jillian Eaton returns today for follow-up.  She was recently seen in the hospital and discharged on 07/17/2017.  Today is a transition of care follow-up.  She was contacted on the day after discharge and felt to be doing well.  She is currently in rehabilitation.  She says she has had marked improvement in her shortness of breath.  She feels like her edema has improved.  She has decreased her dose of daily Lasix from 80 mg to 40 mg.  She is in persistent atrial fibrillation however rate controlled on amiodarone 400 mg which she is taking twice daily.  She reports she is getting stronger and hopefully will be out of rehabilitation within the next week.  She did miss 1 dose of Eliquis due to a transfer issue on May 28 and therefore will need 3 weeks of subsequent uninterrupted anticoagulation prior to attempted  cardioversion.  09/13/2017  Jillian Eaton returns today for follow-up of cardioversion.  Unfortunately this was unsuccessful after 3 shocks.  She told me there is some confusion afterwards that she forgot to take her amiodarone.  She was taking it up until the cardioversion.  Since it was unsuccessful, I recommended rate control strategy.  At this point there is little benefit from being on amiodarone and the side effect profile would not make it favorable.  I would recommend just continuing carvedilol for rate control.  She is had significant weight loss now down to 313 from 340.  This will continue to help her symptoms.  Her shortness of breath has improved.  She has had no significant worsening edema.  No further significant GU bleeding.  09/28/2017  Jillian Eaton returns today for follow-up of multiple ER visits.  I last saw her a couple weeks ago and unfortunately she was maintaining A. fib after unsuccessful cardioversion.  She continues to lose weight.  In fact she is down to 306 pounds today from 313.  Lab work indicates a rising creatinine and very low BNP, which makes me feel that she may be over diuresed.  She is also noted to be anemic with iron deficiency and is anticoagulated on Eliquis.  Her PCP is likely to start her on iron.  She denies any blood in the stool however stool guaiacs or work-up for microscopic iron is indicated.  She reports intermittent hematuria which is been minor.  Blood pressure is noted to be low today 94/69.  She is on low-dose carvedilol for rate control as well as hydrochlorothiazide and low-dose Lasix.  Recently she said she has had improvement in her breathing and for the first time the other day was able to sleep without oxygen.  10/12/2017  Jillian Eaton was seen again today in follow-up.  She called the office that she noted to have significant weight gain.  In July she was 313 and had lost weight down to 297.  Subsequently was found out that she was possibly overtreated  with her thyroid medication and her dose was decreased from 300 mcg to 200 mcg daily by her endocrinologist.  Since then she has gained weight but she has been up 12 pounds over the past 2 weeks.  She does report some edema.  I had stopped her hydrochlorothiazide due to hypotension and blood pressure is better today 112/70.  She  takes low-dose Lasix 20 mg daily.  She also reported that she now has stage III chronic kidney disease.  12/13/2017  Jillian Eaton returns today for follow-up.  She is done well and felt very stable.  Although her weight is up about 10 pounds she denies any worsening fluid.  She remains on 20 mg Lasix daily.  She is been working with her endocrinologist who reduced her levothyroxine.  Some of the weight gain may be related to that as she was on a very high dose of 300 mcg daily.  She denies any worsening shortness of breath.  She is having problems with her right shoulder and is scheduled to go arthroplasty by Dr. Alma Friendly.  She is here today for preoperative risk assessment.  She denies any chest pain.  She is on Eliquis for A. fib which is persistent but rate control.  She would have to stop this typically about 3 days prior to the procedure.  PMHx:  Past Medical History:  Diagnosis Date  . Anemia   . Asthma   . CHF (congestive heart failure) ([place])   . Chronic headache   . DOE (dyspnea on exertion)    2D ECHO, 02/12/2012 - EF 60-65%, moderate concentric hypertrophy  . Fibromyalgia    nerve pain"left side at waist level" "can't lay on that side without pain" , "HOB elevation helps"  . Hematuria - cause not known   . History of kidney stones    x 2 '13, '14 surgery to remove  . Hypertension   . SBO (small bowel obstruction) ([place]) 06/07/2013  . Thyroid disease    "goiter"  . Transfusion history    10 yrs+    Past Surgical History:  Procedure Laterality Date  . CARDIAC CATHETERIZATION  04/04/2010   No significant obstructive coronary artery disease  . CARDIOVERSION N/A  08/08/2017   Procedure: CARDIOVERSION;  Surgeon: Pixie Casino, MD;  Location: [facility];  Service: Cardiovascular;  Laterality: N/A;  . CHOLECYSTECTOMY  1990  . COLONOSCOPY W/ POLYPECTOMY    . COLONOSCOPY WITH PROPOFOL N/A 04/10/2014   Procedure: COLONOSCOPY WITH PROPOFOL;  Surgeon: Beryle Beams, MD;  Location: [facility];  Service: Endoscopy;  Laterality: N/A;  . DIAGNOSTIC LAPAROSCOPY     x2 bowel obstructions(adhesions)  . 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 THYROIDECTOMY    . TUBAL LIGATION  1986    FAMHx:  Family History  Problem Relation Age of Onset  . Diabetes Mother   . Epilepsy Mother   . Cancer Mother        Breast  . Hypertension Mother   . Breast cancer Mother   . Kidney disease Father   . Diabetes Father   . Hypertension Father   . Asthma Father   . Heart disease Father   . Epilepsy Sister   . Cancer Maternal Grandmother   . Breast cancer Maternal Grandmother   . Cancer Paternal Grandmother   . Breast cancer Paternal Grandmother     SOCHx:   reports that she has never smoked. She has never used smokeless tobacco. She reports that she does not drink alcohol or use drugs.  ALLERGIES:  Allergies  Allergen Reactions  . Norco [Hydrocodone-Acetaminophen] Itching    ROS: Pertinent items noted in HPI and remainder of comprehensive ROS otherwise negative.  HOME MEDS: Current Outpatient Medications  Medication Sig Dispense Refill  . acetaminophen (TYLENOL) 650 MG CR tablet Take 1,300  mg by mouth daily.     Marland Kitchen apixaban (ELIQUIS) 5 MG TABS tablet Take 1 tablet (5 mg total) by mouth 2 (two) times daily. 60 tablet 6  . budesonide-formoterol (SYMBICORT) 160-4.5 MCG/ACT inhaler Inhale 2 puffs into the lungs 2 (two) times daily as needed (shortness of breath.).     Marland Kitchen calcitRIOL (ROCALTROL) 0.5 MCG capsule Take 1 capsule (0.5 mcg total) by mouth 2 (two) times daily. 60 capsule 2  . calcium carbonate (OS-CAL - DOSED  IN MG OF ELEMENTAL CALCIUM) 1250 (500 Ca) MG tablet Take 1 tablet (1,250 mg total) by mouth 3 (three) times daily with meals. (Patient taking differently: Take 1,250 mg by mouth 3 (three) times daily. ) 90 tablet 0  . carvedilol (COREG) 3.125 MG tablet Take 1 tablet (3.125 mg total) by mouth 2 (two) times daily with a meal. 60 tablet 0  . Cyanocobalamin (VITAMIN B-12 IJ) Inject as directed. Monthly injections    . DULoxetine (CYMBALTA) 60 MG capsule Take 1 capsule (60 mg total) by mouth daily. 30 capsule 0  . eszopiclone (LUNESTA) 2 MG TABS tablet Take 2 mg by mouth at bedtime.  1  . furosemide (LASIX) 20 MG tablet Take 20 mg by mouth daily.    Marland Kitchen levothyroxine (SYNTHROID, LEVOTHROID) 200 MCG tablet Take 200 mcg by mouth daily before breakfast.    . LYRICA 200 MG capsule Take 200 mg by mouth 2 (two) times daily.   2  . magnesium oxide (MAG-OX) 400 (241.3 Mg) MG tablet Take 400 mg by mouth daily.  0  . methocarbamol (ROBAXIN) 500 MG tablet Take 500 mg by mouth at bedtime.  0  . mirabegron ER (MYRBETRIQ) 50 MG TB24 tablet Take 50 mg by mouth daily.    . OXYGEN Inhale 2 L/min into the lungs as needed (shortness of breath).     . senna (SENOKOT) 8.6 MG TABS tablet Take 1 tablet (8.6 mg total) by mouth daily. 120 each 0  . traZODone (DESYREL) 50 MG tablet Take 50 mg by mouth 2 (two) times daily.  0  . Vitamin D, Ergocalciferol, (DRISDOL) 50000 units CAPS capsule Take 50,000 Units by mouth every 7 (seven) days.     No current facility-administered medications for this visit.     LABS/IMAGING: No results found for this or any previous visit (from the past 48 hour(s)). Mm 3d Screen Breast Bilateral  Result Date: 12/12/2017 CLINICAL DATA:  Screening. EXAM: DIGITAL SCREENING BILATERAL MAMMOGRAM WITH TOMO AND CAD COMPARISON:  Previous exam(s). ACR Breast Density Category b: There are scattered areas of fibroglandular density. FINDINGS: There are no findings suspicious for malignancy. Images were  processed with CAD. IMPRESSION: No mammographic evidence of malignancy. A result letter of this screening mammogram will be mailed directly to the patient. RECOMMENDATION: Screening mammogram in one year. (Code:SM-B-01Y) BI-RADS CATEGORY  1: Negative. Electronically Signed   By: Franki Cabot M.D.   On: 12/12/2017 08:30    VITALS: BP 124/88   Pulse 93   Ht 5\' 5"  (1.651 m)   Wt (!) 304 lb 3.2 oz (138 kg)   BMI 50.62 kg/m   EXAM: General appearance: alert, morbidly obese and in wheelchair Neck: JVD - a few cm above sternal notch and no carotid bruit Lungs: diminished breath sounds bilaterally Heart: regular rate and rhythm Abdomen: soft, non-tender; bowel sounds normal; no masses,  no organomegaly Extremities: edema 1+ bilateral edema Pulses: 2+ and symmetric Skin: Skin color, texture, turgor normal. No rashes or lesions Neurologic:  Grossly normal Psych: Pleasant  EKG: A. fib at 93, nonspecific ST and T wave changes-personally reviewed  ASSESSMENT: 1. Acceptable risk for upcoming shoulder surgery 2. New onset A. fib with RVR-CHADSVASC score of 4 -failed cardioversion on amiodarone 3. Acute diastolic congestive heart failure-LVEF 25% (improved to 60-65% in 06/2016) 4. Super morbid obesity, with failure of 3 gastric bypass procedures 5. Pulmonary hypertension - PA pressure of 64 mmHg, normal LV systolic function 6. Progressive DOE 7. Hypertension-controlled  8. Possible A. fib/ectopic atrial tachycardia-resolved  PLAN: 1.   Ms. Eaton is at acceptable risk for upcoming surgery.  Her A. fib is rate controlled.  She will need to discontinue Eliquis 72 hours prior to the procedure she was started approximately 24 hours after surgery if there is no bleeding risk.  She appears to be stable from a volume standpoint on 20 mg daily Lasix.  Weight has gone back up however this may represent rebound after a significant decrease in her thyroid medication dose.  Plan follow-up with me in 6 months  or sooner as necessary.  Pixie Casino, MD, [facility], [place] Director of the Advanced Lipid Disorders &  Cardiovascular Risk Reduction Clinic Diplomate of the American Board of Clinical Lipidology Attending Cardiologist  Direct Dial: [phone]  Fax: [phone]  Website:  [email or web address] 12/13/2017, 2:17 PM

## 2017-12-13 NOTE — Telephone Encounter (Signed)
Patient has been verified through insurance and I am waiting for reps to clarify out-of-pocket cost- patient will be called to schedule nurse visit once this has been done

## 2017-12-13 NOTE — Patient Instructions (Signed)
Medication Instructions:  Continue current medications If you need a refill on your cardiac medications before your next appointment, please call your pharmacy.   Follow-Up: At [facility], you and your health needs are our priority.  As part of our continuing mission to provide you with exceptional heart care, we have created designated Provider Care Teams.  These Care Teams include your primary Cardiologist (physician) and Advanced Practice Providers (APPs -  Physician Assistants and Nurse Practitioners) who all work together to provide you with the care you need, when you need it. You will need a follow up appointment in 6 months.  Please call our office 2 months in advance to schedule this appointment.  You may see Kenneth C Hilty, MD or one of the following Advanced Practice Providers on your designated Care Team: Hao Meng, PA-C . Angela Duke, PA-C  Any Other Special Instructions Will Be Listed Below (If Applicable).    

## 2017-12-19 ENCOUNTER — Encounter: Payer: Self-pay | Admitting: Internal Medicine

## 2017-12-26 ENCOUNTER — Encounter: Payer: Self-pay | Admitting: Internal Medicine

## 2018-01-01 NOTE — H&P (Signed)
Patient's anticipated LOS is less than 2 midnights, meeting these requirements: - Younger than 26 - Lives within 1 hour of care - Has a competent adult at home to recover with post-op recover - NO history of  - Chronic pain requiring opiods  - Diabetes  - Coronary Artery Disease  - Heart failure  - Heart attack  - Stroke  - DVT/VTE  - Cardiac arrhythmia  - Respiratory Failure/COPD  - Renal failure  - Anemia  - Advanced Liver disease       Jillian Eaton is an 67 y.o. female.    Chief Complaint: right shoulder pain  HPI: Pt is a 67 y.o. female complaining of right shoulder pain for multiple years. Pain had continually increased since the beginning. X-rays in the clinic show end-stage arthritic changes of the right shoulder. Pt has tried various conservative treatments which have failed to alleviate their symptoms, including injections and therapy. Various options are discussed with the patient. Risks, benefits and expectations were discussed with the patient. Patient understand the risks, benefits and expectations and wishes to proceed with surgery.   PCP:  [name], MD  D/C Plans: Home  PMH: Past Medical History:  Diagnosis Date  . Anemia   . Asthma   . CHF (congestive heart failure) ([place])   . Chronic headache   . DOE (dyspnea on exertion)    2D ECHO, 02/12/2012 - EF 60-65%, moderate concentric hypertrophy  . Fibromyalgia    nerve pain"left side at waist level" "can't lay on that side without pain" , "HOB elevation helps"  . Hematuria - cause not known   . History of kidney stones    x 2 '13, '14 surgery to remove  . Hypertension   . SBO (small bowel obstruction) ([place]) 06/07/2013  . Thyroid disease    "goiter"  . Transfusion history    10 yrs+    PSH: Past Surgical History:  Procedure Laterality Date  . CARDIAC CATHETERIZATION  04/04/2010   No significant obstructive coronary artery disease  . CARDIOVERSION N/A 08/08/2017   Procedure:  CARDIOVERSION;  Surgeon: Pixie Casino, MD;  Location: [facility];  Service: Cardiovascular;  Laterality: N/A;  . CHOLECYSTECTOMY  1990  . COLONOSCOPY W/ POLYPECTOMY    . COLONOSCOPY WITH PROPOFOL N/A 04/10/2014   Procedure: COLONOSCOPY WITH PROPOFOL;  Surgeon: Beryle Beams, MD;  [facility];  Service: Endoscopy;  Laterality: N/A;  . DIAGNOSTIC LAPAROSCOPY     x2 bowel obstructions(adhesions)  . 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 THYROIDECTOMY    . TUBAL LIGATION  1986    Social History:  reports that she has never smoked. She has never used smokeless tobacco. She reports that she does not drink alcohol or use drugs.  Allergies:  Allergies  Allergen Reactions  . Norco [Hydrocodone-Acetaminophen] Itching    Medications: No current facility-administered medications for this encounter.    Current Outpatient Medications  Medication Sig Dispense Refill  . acetaminophen (TYLENOL) 650 MG CR tablet Take 1,300 mg by mouth 3 (three) times daily.     Marland Kitchen albuterol (PROVENTIL HFA;VENTOLIN HFA) 108 (90 Base) MCG/ACT inhaler Inhale 1-2 puffs into the lungs every 6 (six) hours as needed for wheezing or shortness of breath.    Marland Kitchen apixaban (ELIQUIS) 5 MG TABS tablet Take 1 tablet (5 mg total) by mouth 2 (two) times daily. 180 tablet 3  . budesonide-formoterol (SYMBICORT) 160-4.5 MCG/ACT inhaler Inhale  2 puffs into the lungs 2 (two) times daily as needed (shortness of breath.).     Marland Kitchen calcitRIOL (ROCALTROL) 0.5 MCG capsule Take 1 capsule (0.5 mcg total) by mouth 2 (two) times daily. 60 capsule 2  . calcium carbonate (OS-CAL - DOSED IN MG OF ELEMENTAL CALCIUM) 1250 (500 Ca) MG tablet Take 1 tablet (1,250 mg total) by mouth 3 (three) times daily with meals. (Patient taking differently: Take 3 tablets by mouth 2 (two) times daily. ) 90 tablet 0  . carvedilol (COREG) 3.125 MG tablet Take 1 tablet (3.125 mg total) by mouth 2 (two) times daily with a  meal. 60 tablet 0  . Cyanocobalamin (VITAMIN B-12 IJ) Inject 1,000 mcg as directed every 30 (thirty) days. Monthly injections     . cyclobenzaprine (FLEXERIL) 10 MG tablet Take 10 mg by mouth 3 (three) times daily as needed for spasms.  0  . DULoxetine (CYMBALTA) 60 MG capsule Take 1 capsule (60 mg total) by mouth daily. 30 capsule 0  . eszopiclone (LUNESTA) 2 MG TABS tablet Take 2 mg by mouth at bedtime.  1  . furosemide (LASIX) 20 MG tablet Take 20 mg by mouth daily.    . iron polysaccharides (NIFEREX) 150 MG capsule Take 150 mg by mouth 2 (two) times daily.    Marland Kitchen levothyroxine (SYNTHROID, LEVOTHROID) 200 MCG tablet Take 200 mcg by mouth daily before breakfast.    . LYRICA 200 MG capsule Take 200 mg by mouth 2 (two) times daily.   2  . magnesium oxide (MAG-OX) 400 MG tablet Take 400 mg by mouth daily.  0  . methocarbamol (ROBAXIN) 500 MG tablet Take 500 mg by mouth at bedtime.  0  . mirabegron ER (MYRBETRIQ) 50 MG TB24 tablet Take 50 mg by mouth daily.    Marland Kitchen senna (SENOKOT) 8.6 MG TABS tablet Take 1 tablet (8.6 mg total) by mouth daily. 120 each 0  . traMADol (ULTRAM) 50 MG tablet Take 50 mg by mouth every 6 (six) hours as needed for pain.  0  . traZODone (DESYREL) 50 MG tablet Take 50 mg by mouth at bedtime.   0  . Vitamin D, Ergocalciferol, (DRISDOL) 50000 units CAPS capsule Take 50,000 Units by mouth every Wednesday.     . OXYGEN Inhale 2 L/min into the lungs as needed (shortness of breath).       No results found for this or any previous visit (from the past 48 hour(s)). No results found.  ROS: Pain with rom of the right upper extremity  Physical Exam: Alert and oriented 66 y.o. female in no acute distress Cranial nerves 2-12 intact Cervical spine: full rom with no tenderness, nv intact distally Chest: active breath sounds bilaterally, no wheeze rhonchi or rales Heart: regular rate and rhythm, no murmur Abd: non tender non distended with active bowel sounds Hip is stable with rom    Right shoulder with limited rom and strength due to arthropathy nv intact distally No rashes or edema  Assessment/Plan Assessment: right shoulder cuff arthropathy  Plan:  Patient will undergo a right reverse total shoulder  by Dr. Veverly Fells at [facility]. Risks benefits and expectations were discussed with the patient. Patient understand risks, benefits and expectations and wishes to proceed. Preoperative templating of the joint replacement has been completed, documented, and submitted to the Operating Room personnel in order to optimize intra-operative equipment management.   Brad Katarzyna Wolven PA-C, MPAS [employer] is now [employer]  [address],  [place], [place] [address] Phone: [phone] [email or web address] Facebook  [employer]

## 2018-01-02 ENCOUNTER — Telehealth: Payer: Self-pay

## 2018-01-02 NOTE — Pre-Procedure Instructions (Signed)
Shakeena Gaynelle Ouellet  01/02/2018      [id number] [place] [place] - [address] AT [place]. [place] [address] Phone: [phone] Fax: [phone]  [facility] [place] [place] to [address] [place] [address] Phone: [phone] Fax: [phone]    Your procedure is scheduled on November 22nd.  Report to [facility] Admitting at [place].M.  Call this number if you have problems the morning of surgery:  [phone]   Remember:  Do not eat or drink after midnight.    Take these medicines the morning of surgery with A SIP OF WATER   acetaminophen (TYLENOL)  If needed  albuterol (PROVENTIL HFA;VENTOLIN HFA) if needed. Bring Inhaler with you.  carvedilol (COREG)  cyclobenzaprine (FLEXERIL) if needed  DULoxetine (CYMBALTA)  levothyroxine (SYNTHROID, LEVOTHROID)  senna (SENOKOT)  mirabegron ER (MYRBETRIQ)   traMADol (ULTRAM) if needed  Follow your surgeon's instructions on when to stop/resume your Eliquis. If no instructions were given to you, call your surgeon's office.    7 days prior to surgery STOP taking any Aspirin(unless otherwise instructed by your surgeon), Aleve, Naproxen, Ibuprofen, Motrin, Advil, Goody's, BC's, all herbal medications, fish oil, and all vitamins     Do not wear jewelry, make-up or nail polish.  Do not wear lotions, powders, or perfumes, or deodorant.  Do not shave 48 hours prior to surgery.  Men may shave face and neck.  Do not bring valuables to the hospital.  [facility] is not responsible for any belongings or valuables.  Contacts, dentures or bridgework may not be worn into surgery.  Leave your suitcase in the car.  After surgery it may be brought to your room.  For patients admitted to the hospital, discharge time will be determined by your treatment team.  Patients  discharged the day of surgery will not be allowed to drive home.    [place]- Preparing For Surgery  Before surgery, you can play an important role. Because skin is not sterile, your skin needs to be as free of germs as possible. You can reduce the number of germs on your skin by washing with CHG (chlorahexidine gluconate) Soap before surgery.  CHG is an antiseptic cleaner which kills germs and bonds with the skin to continue killing germs even after washing.    Oral Hygiene is also important to reduce your risk of infection.  Remember - BRUSH YOUR TEETH THE MORNING OF SURGERY WITH YOUR REGULAR TOOTHPASTE  Please do not use if you have an allergy to CHG or antibacterial soaps. If your skin becomes reddened/irritated stop using the CHG.  Do not shave (including legs and underarms) for at least 48 hours prior to first CHG shower. It is OK to shave your face.  Please follow these instructions carefully.   1. Shower the NIGHT BEFORE SURGERY and the MORNING OF SURGERY with CHG.   2. If you chose to wash your hair, wash your hair first as usual with your normal shampoo.  3. After you shampoo, rinse your hair and body thoroughly to remove the shampoo.  4. Use CHG as you would any other liquid soap. You can apply CHG directly to the skin and wash gently with a scrungie or a clean washcloth.   5. Apply the CHG Soap to your body ONLY FROM THE NECK DOWN.  Do not use on open  wounds or open sores. Avoid contact with your eyes, ears, mouth and genitals (private parts). Wash Face and genitals (private parts)  with your normal soap.  6. Wash thoroughly, paying special attention to the area where your surgery will be performed.  7. Thoroughly rinse your body with warm water from the neck down.  8. DO NOT shower/wash with your normal soap after using and rinsing off the CHG Soap.  9. Pat yourself dry with a CLEAN TOWEL.  10. Wear CLEAN PAJAMAS to bed the night before surgery, wear comfortable  clothes the morning of surgery  11. Place CLEAN SHEETS on your bed the night of your first shower and DO NOT SLEEP WITH PETS.    Day of Surgery:  Do not apply any deodorants/lotions.  Please wear clean clothes to the hospital/surgery center.   Remember to brush your teeth WITH YOUR REGULAR TOOTHPASTE.    Please read over the following fact sheets that you were given.

## 2018-01-02 NOTE — Telephone Encounter (Signed)
Patient scheduled for Prolia on 01/03/18

## 2018-01-03 ENCOUNTER — Encounter (HOSPITAL_COMMUNITY): Payer: Self-pay

## 2018-01-03 ENCOUNTER — Other Ambulatory Visit: Payer: Self-pay

## 2018-01-03 ENCOUNTER — Encounter (HOSPITAL_COMMUNITY)
Admission: RE | Admit: 2018-01-03 | Discharge: 2018-01-03 | Disposition: A | Discharge status: Home / Self Care | Payer: Medicare Other | Source: Ambulatory Visit | Attending: Orthopedic Surgery | Admitting: Orthopedic Surgery

## 2018-01-03 ENCOUNTER — Ambulatory Visit (INDEPENDENT_AMBULATORY_CARE_PROVIDER_SITE_OTHER): Payer: Medicare Other

## 2018-01-03 DIAGNOSIS — M810 Age-related osteoporosis without current pathological fracture: Secondary | ICD-10-CM

## 2018-01-03 DIAGNOSIS — Z01812 Encounter for preprocedural laboratory examination: Secondary | ICD-10-CM | POA: Insufficient documentation

## 2018-01-03 HISTORY — DX: Sleep apnea, unspecified: G47.30

## 2018-01-03 LAB — BASIC METABOLIC PANEL
ANION GAP: 10 (ref 5–15)
BUN: 29 mg/dL — ABNORMAL HIGH (ref 8–23)
CHLORIDE: 101 mmol/L (ref 98–111)
CO2: 28 mmol/L (ref 22–32)
Calcium: 9.1 mg/dL (ref 8.9–10.3)
Creatinine, Ser: 1.24 mg/dL — ABNORMAL HIGH (ref 0.44–1.00)
GFR calc non Af Amer: 45 mL/min — ABNORMAL LOW (ref >60)
GFR, EST AFRICAN AMERICAN: 52 mL/min — AB (ref >60)
Glucose, Bld: 83 mg/dL (ref 70–99)
POTASSIUM: 5.6 mmol/L — AB (ref 3.5–5.1)
SODIUM: 139 mmol/L (ref 135–145)

## 2018-01-03 LAB — SURGICAL PCR SCREEN
MRSA, PCR: NEGATIVE
STAPHYLOCOCCUS AUREUS: NEGATIVE

## 2018-01-03 LAB — CBC
HEMATOCRIT: 43.9 % (ref 36.0–46.0)
HEMOGLOBIN: 12.8 g/dL (ref 12.0–15.0)
MCH: 26.1 pg (ref 26.0–34.0)
MCHC: 29.2 g/dL — ABNORMAL LOW (ref 30.0–36.0)
MCV: 89.4 fL (ref 80.0–100.0)
NRBC: 0 % (ref 0.0–0.2)
Platelets: 161 10*3/uL (ref 150–400)
RBC: 4.91 MIL/uL (ref 3.87–5.11)
RDW: 21.7 % — ABNORMAL HIGH (ref 11.5–15.5)
WBC: 3.9 10*3/uL — AB (ref 4.0–10.5)

## 2018-01-03 MED ORDER — DENOSUMAB 60 MG/ML ~~LOC~~ SOSY
60.0000 mg | PREFILLED_SYRINGE | Freq: Once | SUBCUTANEOUS | Status: AC
  Administered 2018-01-03: 60 mg via SUBCUTANEOUS

## 2018-01-03 NOTE — Progress Notes (Signed)
I called office and relayed message (hopefully will get to T. Dixon, PA) that our knee high teds will not fit Ms. Morimoto's calves, that were ordered for surgery.   And does he just want SCD's.

## 2018-01-03 NOTE — Progress Notes (Signed)
Per orders of Dr. Gherghe injection of Prolia given today by Katrinka Herbison, Certified Medical Assistant . Patient tolerated injection well.  

## 2018-01-03 NOTE — Pre-Procedure Instructions (Signed)
Makaelyn Gaynelle Zweig  01/03/2018      [id number] [place], [place] - [address] AT [address]. [place] [address] Phone: [phone] Fax: [phone]  [facility] [place], [place] to [address] [place] [address] Phone: [phone] Fax: [phone]    Your procedure is scheduled on Friday,  November 22nd.   Report to [facility] Admitting at [place].M.             (posted surgery time 9:40a - 11:40a)   Call this number if you have problems the morning of surgery:  [phone]   Remember:   Do not eat any foods or drink any liquids after midnight, Thursday.    Take these medicines the morning of surgery with A SIP OF WATER :   acetaminophen (TYLENOL)  If needed  albuterol (PROVENTIL HFA;VENTOLIN HFA) if needed. Bring Inhaler with you.  carvedilol (COREG)  cyclobenzaprine (FLEXERIL) if needed  DULoxetine (CYMBALTA)  levothyroxine (SYNTHROID, LEVOTHROID)  senna (SENOKOT)  mirabegron ER (MYRBETRIQ)   traMADol (ULTRAM) if needed  Follow your surgeon's instructions on when to stop/resume your Eliquis. If no instructions were given to you, call your surgeon's office.  LAST DOSE OF ELIQUIS ___________________________    7 days prior to surgery STOP taking any Aspirin(unless otherwise instructed by your surgeon), Aleve, Naproxen, Ibuprofen, Motrin, Advil, Goody's, BC's, all herbal medications, fish oil, and all vitamins     Do not wear jewelry, make-up or nail polish.  Do not wear lotions, powders, perfumes, or deodorant.  Do not shave 48 hours prior to surgery.     Do not bring valuables to the hospital.  [facility] is not responsible for any belongings or valuables.  Contacts, dentures or bridgework may not be worn into surgery.  Leave your suitcase in the car.  After surgery it may be brought to your  room.  For patients admitted to the hospital, discharge time will be determined by your treatment team.     [facility]- Preparing For Surgery  Before surgery, you can play an important role. Because skin is not sterile, your skin needs to be as free of germs as possible. You can reduce the number of germs on your skin by washing with CHG (chlorahexidine gluconate) Soap before surgery.  CHG is an antiseptic cleaner which kills germs and bonds with the skin to continue killing germs even after washing.    Oral Hygiene is also important to reduce your risk of infection.    Remember - BRUSH YOUR TEETH THE MORNING OF SURGERY WITH YOUR REGULAR TOOTHPASTE  Please do not use if you have an allergy to CHG or antibacterial soaps. If your skin becomes reddened/irritated stop using the CHG.  Do not shave (including legs and underarms) for at least 48 hours prior to first CHG shower. It is OK to shave your face.  Please follow these instructions carefully.   1. Shower the NIGHT BEFORE SURGERY and the MORNING OF SURGERY with CHG.   2. If you chose to wash your hair, wash your hair first as usual with your normal shampoo.  3. After you shampoo, rinse your hair and body thoroughly to remove the shampoo.  4. Use CHG as you would any other liquid soap. You can apply CHG directly to the skin and wash gently with a scrungie or a clean washcloth.  5. Apply the CHG Soap to your body ONLY FROM THE NECK DOWN.  Do not use on open wounds or open sores. Avoid contact with your eyes, ears, mouth and genitals (private parts). Wash Face and genitals (private parts)  with your normal soap.  6. Wash thoroughly, paying special attention to the area where your surgery will be performed.  7. Thoroughly rinse your body with warm water from the neck down.  8. DO NOT shower/wash with your normal soap after using and rinsing off the CHG Soap.  9. Pat yourself dry with a CLEAN TOWEL.  10. Wear CLEAN PAJAMAS to bed  the night before surgery, wear comfortable clothes the morning of surgery  11. Place CLEAN SHEETS on your bed the night of your first shower and DO NOT SLEEP WITH PETS.  Day of Surgery:  Do not apply any deodorants/lotions.  Please wear clean clothes to the hospital/surgery center.   Remember to brush your teeth WITH YOUR REGULAR TOOTHPASTE.  Please read over the following fact sheets that you were given.

## 2018-01-04 NOTE — Progress Notes (Signed)
Anesthesia Chart Review:  Case:  [id number] Date/Time:  01/11/18 1000   Procedure:  REVERSE SHOULDER ARTHROPLASTY (Right )   Anesthesia type:  Choice   Pre-op diagnosis:  Right shoulder osteoarthritis   Location:  [facility] OR ROOM 05 / [facility]   Surgeon:  Netta Cedars, MD      DISCUSSION: 67 yo female never smoker. 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  Pt was hospitalized 5/21-5/28/2019 for Acute on chronic hypoxic and hypercarbic respiratory failure, Acute on chronic diastolic CHF, and mild AKI. During hospitalization pulmonary recommended home BiPAP.   Pt follows with pulmonology at [facility], Dr. Francena Hanly last seen 08/01/2017. 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 His note states that the pt inquired about need for home BiPAP as it had been recommended during her hospitalization and she was not using one. He advised getting a new sleep study for evaluation. I do not see any subsequent changes to her treatment regimen.   She has pulmonary clearance from Dr. Camillo Flaming dated 01/04/2018 stating pt is at low risk fm a pulmonary standpoint.  Cardiac clearance by Dr. [name] 12/13/17 stating "Jillian Eaton is at acceptable risk for upcoming surgery.  Her A. fib is rate controlled.  She will need to discontinue Eliquis 72 hours prior to the procedure she was started approximately 24 hours after surgery if there is no bleeding risk.  She appears to be stable from a volume standpoint on 20 mg daily Lasix.  Weight has gone back up however this may represent rebound after a significant decrease in her thyroid medication dose.  Plan follow-up with me in 6 months or sooner as necessary."  Anticipate she can proceed as planned barring acute status change.  VS: BP (!) 160/90   Pulse 93   Temp 36.5 C   Resp 20    Ht 5\' 5"  (1.651 m)   Wt (!) 137.6 kg   SpO2 95%   BMI 50.49 kg/m   PROVIDERS: [name], MD is PCP  Lyman Bishop, MD is Cardiologist  Thea Alken, MD is Pulmonologist  LABS: Labs reviewed: Acceptable for surgery. (all labs ordered are listed, but only abnormal results are displayed)  Labs Reviewed  CBC - Abnormal; Notable for the following components:      Result Value   WBC 3.9 (*)    MCHC 29.2 (*)    RDW 21.7 (*)    All other components within normal limits  BASIC METABOLIC PANEL - Abnormal; Notable for the following components:   Potassium 5.6 (*)    BUN 29 (*)    Creatinine, Ser 1.24 (*)    GFR calc non Af Amer 45 (*)    GFR calc Af Amer 52 (*)    All other components within normal limits  SURGICAL PCR SCREEN     IMAGES: CTA Chest 09/21/2017: IMPRESSION: 1. No pulmonary emboli or acute abnormality. 2. Stable enlarged central pulmonary arteries suggesting pulmonary arterial hypertension. 3. 6 mm upper pole right renal calculus.  EKG: 12/14/2017: Afib. Vent rate 93. Nonspecific ST and T wave abnormality.  CV: TTE 07/13/2017: Study Conclusions  - Procedure narrative: Transthoracic echocardiography. Technically   difficult study with reduced echocardiographic windows due to   body habitus. - Left ventricle: Systolic function was normal. The estimated   ejection fraction was in the range of  60% to 65%. The study is   not technically sufficient to allow evaluation of LV diastolic   function. - Aortic valve: Trileaflet. Sclerosis without stenosis. There was   no regurgitation. - Mitral valve: Poorly visualized. Mildly thickened leaflets .   There was mild to moderate regurgitation. - Left atrium: Severely dilated. - Right ventricle: The cavity size was moderately dilated. Mildly   reduced systolic function. - Right atrium: Severely dilated. - Tricuspid valve: There was mild regurgitation. - Pulmonary arteries: PA peak pressure: 52 mm Hg  (S). - Inferior vena cava: The vessel was dilated. The respirophasic   diameter changes were blunted (< 50%), consistent with elevated   central venous pressure.  Impressions:  - Compared to a prior study earlier this month, the LVEF is similar   at 60-65%. RVSP is higher at 52 mmHg. There is severe biatrial   enlargement.  Past Medical History:  Diagnosis Date  . Anemia   . Asthma    flare up last yr  . CHF (congestive heart failure) ([place])   . Chronic headache   . DOE (dyspnea on exertion)    2D ECHO, 02/12/2012 - EF 60-65%, moderate concentric hypertrophy  . Fibromyalgia    nerve pain"left side at waist level" "can't lay on that side without pain" , "HOB elevation helps"  . Heart murmur   . Hematuria - cause not known   . History of kidney stones    x 2 '13, '14 surgery to remove  . Hypertension   . SBO (small bowel obstruction) ([place]) 06/07/2013  . Sleep apnea    tested several times and came up negative  . Thyroid disease    "goiter"  . Transfusion history    10 yrs+    Past Surgical History:  Procedure Laterality Date  . CARDIAC CATHETERIZATION  04/04/2010   No significant obstructive coronary artery disease  . CARDIOVERSION N/A 08/08/2017   Procedure: CARDIOVERSION;  Surgeon: Pixie Casino, MD;  Location: [facility];  Service: Cardiovascular;  Laterality: N/A;  . CHOLECYSTECTOMY  1990  . COLONOSCOPY W/ POLYPECTOMY    . COLONOSCOPY WITH PROPOFOL N/A 04/10/2014   Procedure: COLONOSCOPY WITH PROPOFOL;  Surgeon: Beryle Beams, MD;  [facility];  Service: Endoscopy;  Laterality: N/A;  . DIAGNOSTIC LAPAROSCOPY     x2 bowel obstructions(adhesions)  . EYE SURGERY     lasik 20-25 yrs ago  . 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 THYROIDECTOMY    . TUBAL LIGATION  1986    MEDICATIONS: . acetaminophen (TYLENOL) 650 MG CR tablet  . albuterol (PROVENTIL HFA;VENTOLIN HFA) 108 (90 Base) MCG/ACT inhaler  .  apixaban (ELIQUIS) 5 MG TABS tablet  . budesonide-formoterol (SYMBICORT) 160-4.5 MCG/ACT inhaler  . calcitRIOL (ROCALTROL) 0.5 MCG capsule  . calcium carbonate (OS-CAL - DOSED IN MG OF ELEMENTAL CALCIUM) 1250 (500 Ca) MG tablet  . carvedilol (COREG) 3.125 MG tablet  . Cyanocobalamin (VITAMIN B-12 IJ)  . cyclobenzaprine (FLEXERIL) 10 MG tablet  . DULoxetine (CYMBALTA) 60 MG capsule  . eszopiclone (LUNESTA) 2 MG TABS tablet  . furosemide (LASIX) 20 MG tablet  . iron polysaccharides (NIFEREX) 150 MG capsule  . levothyroxine (SYNTHROID, LEVOTHROID) 200 MCG tablet  . LYRICA 200 MG capsule  . magnesium oxide (MAG-OX) 400 MG tablet  . methocarbamol (ROBAXIN) 500 MG tablet  . mirabegron ER (MYRBETRIQ) 50 MG TB24 tablet  . OXYGEN  . senna (SENOKOT) 8.6 MG TABS  tablet  . traMADol (ULTRAM) 50 MG tablet  . traZODone (DESYREL) 50 MG tablet  . Vitamin D, Ergocalciferol, (DRISDOL) 50000 units CAPS capsule   No current facility-administered medications for this encounter.     Wynonia Musty [facility] Short Stay Center/Anesthesiology Phone [phone] 01/08/2018 12:53 PM

## 2018-01-08 NOTE — Anesthesia Preprocedure Evaluation (Addendum)
Anesthesia Evaluation  Patient identified by MRN, date of birth, ID band Patient awake    Reviewed: Allergy & Precautions, NPO status , Patient's Chart, lab work & pertinent test results  Airway Mallampati: II  TM Distance: >3 FB Neck ROM: Full    Dental no notable dental hx.    Pulmonary neg pulmonary ROS, asthma , sleep apnea ,    Pulmonary exam normal breath sounds clear to auscultation       Cardiovascular hypertension, Pt. on medications + DOE  Normal cardiovascular exam+ Valvular Problems/Murmurs  Rhythm:Regular Rate:Normal     Neuro/Psych  Headaches, Seizures -,  Depression  Neuromuscular disease negative psych ROS   GI/Hepatic Neg liver ROS, GERD  ,  Endo/Other  Hypothyroidism Morbid obesity  Renal/GU negative Renal ROS  negative genitourinary   Musculoskeletal  (+) Fibromyalgia -  Abdominal (+) + obese,   Peds negative pediatric ROS (+)  Hematology negative hematology ROS (+)   Anesthesia Other Findings   Reproductive/Obstetrics negative OB ROS                                                             Anesthesia Evaluation  Patient identified by MRN, date of birth, ID band Patient awake    Reviewed: Allergy & Precautions, H&P , NPO status , Patient's Chart, lab work & pertinent test results, reviewed documented beta blocker date and time   Airway Mallampati: II  TM Distance: >3 FB Neck ROM: full    Dental no notable dental hx.    Pulmonary neg pulmonary ROS, shortness of breath, at rest and Long-Term Oxygen Therapy, asthma ,    Pulmonary exam normal breath sounds clear to auscultation       Cardiovascular hypertension, On Medications negative cardio ROS   Rhythm:regular Rate:Normal     Neuro/Psych negative neurological ROS  negative psych ROS   GI/Hepatic negative GI ROS, Neg liver ROS, GERD  ,  Endo/Other  negative endocrine ROSHypothyroidism  Morbid obesity  Renal/GU negative Renal ROS  negative genitourinary   Musculoskeletal negative musculoskeletal ROS (+)   Abdominal   Peds negative pediatric ROS (+)  Hematology negative hematology ROS (+)   Anesthesia Other Findings 1.   Super morbid obesity, with failure of 3 gastric bypass procedures 2.   Pulmonary hypertension - PA pressure of 51 mmHg 3.   Stable dyspnea on exertion 4.   Hypertension-controlled  Reproductive/Obstetrics negative OB ROS                             Anesthesia Physical  Anesthesia Plan  ASA: III  Anesthesia Plan: General   Post-op Pain Management:    Induction: Intravenous  PONV Risk Score and Plan: 3 and Treatment may vary due to age or medical condition  Airway Management Planned: Natural Airway and Mask  Additional Equipment:   Intra-op Plan:   Post-operative Plan:   Informed Consent: I have reviewed the patients History and Physical, chart, labs and discussed the procedure including the risks, benefits and alternatives for the proposed anesthesia with the patient or authorized representative who has indicated his/her understanding and acceptance.     Plan Discussed with: CRNA and Surgeon  Anesthesia Plan Comments:         Anesthesia  Quick Evaluation  Anesthesia Physical Anesthesia Plan  ASA: III  Anesthesia Plan: General   Post-op Pain Management:    Induction: Intravenous  PONV Risk Score and Plan: 3 and Ondansetron, Dexamethasone and Midazolam  Airway Management Planned: Oral ETT  Additional Equipment:   Intra-op Plan:   Post-operative Plan: Extubation in OR  Informed Consent: I have reviewed the patients History and Physical, chart, labs and discussed the procedure including the risks, benefits and alternatives for the proposed anesthesia with the patient or authorized representative who has indicated his/her understanding and acceptance.   Dental advisory given  Plan  Discussed with: CRNA  Anesthesia Plan Comments: (See PAT note 01/03/2018 by Karoline Caldwell, PA-C )       Anesthesia Quick Evaluation

## 2018-01-10 MED ORDER — DEXTROSE 5 % IV SOLN
3.0000 g | INTRAVENOUS | Status: DC
  Filled 2018-01-10: qty 3000

## 2018-01-11 ENCOUNTER — Inpatient Hospital Stay (HOSPITAL_COMMUNITY): Payer: Medicare Other | Admitting: Vascular Surgery

## 2018-01-11 ENCOUNTER — Ambulatory Visit (HOSPITAL_COMMUNITY)
Admission: RE | Admit: 2018-01-11 | Discharge: 2018-01-11 | Disposition: A | Discharge status: Home / Self Care | Payer: Medicare Other | Source: Ambulatory Visit | Attending: Orthopedic Surgery | Admitting: Orthopedic Surgery

## 2018-01-11 ENCOUNTER — Encounter (HOSPITAL_COMMUNITY): Payer: Self-pay | Admitting: Surgery

## 2018-01-11 ENCOUNTER — Other Ambulatory Visit: Payer: Self-pay

## 2018-01-11 ENCOUNTER — Inpatient Hospital Stay (HOSPITAL_COMMUNITY): Payer: Medicare Other | Admitting: Anesthesiology

## 2018-01-11 ENCOUNTER — Encounter (HOSPITAL_COMMUNITY)
Admission: RE | Disposition: A | Discharge status: Home / Self Care | Payer: Self-pay | Source: Ambulatory Visit | Attending: Orthopedic Surgery

## 2018-01-11 DIAGNOSIS — I110 Hypertensive heart disease with heart failure: Secondary | ICD-10-CM | POA: Insufficient documentation

## 2018-01-11 DIAGNOSIS — Z7901 Long term (current) use of anticoagulants: Secondary | ICD-10-CM | POA: Insufficient documentation

## 2018-01-11 DIAGNOSIS — Z885 Allergy status to narcotic agent status: Secondary | ICD-10-CM | POA: Diagnosis not present

## 2018-01-11 DIAGNOSIS — Z79899 Other long term (current) drug therapy: Secondary | ICD-10-CM | POA: Insufficient documentation

## 2018-01-11 DIAGNOSIS — M797 Fibromyalgia: Secondary | ICD-10-CM | POA: Diagnosis not present

## 2018-01-11 DIAGNOSIS — E079 Disorder of thyroid, unspecified: Secondary | ICD-10-CM | POA: Insufficient documentation

## 2018-01-11 DIAGNOSIS — M19011 Primary osteoarthritis, right shoulder: Secondary | ICD-10-CM | POA: Diagnosis not present

## 2018-01-11 DIAGNOSIS — J45909 Unspecified asthma, uncomplicated: Secondary | ICD-10-CM | POA: Diagnosis not present

## 2018-01-11 DIAGNOSIS — Z87442 Personal history of urinary calculi: Secondary | ICD-10-CM | POA: Insufficient documentation

## 2018-01-11 DIAGNOSIS — I959 Hypotension, unspecified: Secondary | ICD-10-CM | POA: Insufficient documentation

## 2018-01-11 DIAGNOSIS — Z538 Procedure and treatment not carried out for other reasons: Secondary | ICD-10-CM | POA: Diagnosis not present

## 2018-01-11 DIAGNOSIS — I509 Heart failure, unspecified: Secondary | ICD-10-CM | POA: Diagnosis not present

## 2018-01-11 LAB — PROTIME-INR
INR: 1.29
PROTHROMBIN TIME: 16 s — AB (ref 11.4–15.2)

## 2018-01-11 SURGERY — CANCELLED PROCEDURE
Anesthesia: General | Laterality: Right

## 2018-01-11 MED ORDER — SUGAMMADEX SODIUM 200 MG/2ML IV SOLN
INTRAVENOUS | Status: DC | PRN
  Administered 2018-01-11: 200 mg via INTRAVENOUS
  Administered 2018-01-11: 300 mg via INTRAVENOUS

## 2018-01-11 MED ORDER — PHENYLEPHRINE 40 MCG/ML (10ML) SYRINGE FOR IV PUSH (FOR BLOOD PRESSURE SUPPORT)
PREFILLED_SYRINGE | INTRAVENOUS | Status: AC
  Filled 2018-01-11: qty 10

## 2018-01-11 MED ORDER — LIDOCAINE 2% (20 MG/ML) 5 ML SYRINGE
INTRAMUSCULAR | Status: AC
  Filled 2018-01-11: qty 5

## 2018-01-11 MED ORDER — BUPIVACAINE-EPINEPHRINE (PF) 0.25% -1:200000 IJ SOLN
INTRAMUSCULAR | Status: AC
  Filled 2018-01-11: qty 30

## 2018-01-11 MED ORDER — SUGAMMADEX SODIUM 200 MG/2ML IV SOLN
INTRAVENOUS | Status: AC
  Filled 2018-01-11: qty 2

## 2018-01-11 MED ORDER — PROPOFOL 10 MG/ML IV BOLUS
INTRAVENOUS | Status: DC | PRN
  Administered 2018-01-11: 120 mg via INTRAVENOUS

## 2018-01-11 MED ORDER — HYDROMORPHONE HCL 1 MG/ML IJ SOLN: 0.2500 mg | INTRAMUSCULAR | Status: DC | PRN

## 2018-01-11 MED ORDER — ONDANSETRON HCL 4 MG/2ML IJ SOLN
INTRAMUSCULAR | Status: AC
  Filled 2018-01-11: qty 2

## 2018-01-11 MED ORDER — LACTATED RINGERS IV SOLN
INTRAVENOUS | Status: DC
  Administered 2018-01-11: 09:00:00 via INTRAVENOUS

## 2018-01-11 MED ORDER — FENTANYL CITRATE (PF) 100 MCG/2ML IJ SOLN
INTRAMUSCULAR | Status: DC | PRN
  Administered 2018-01-11: 50 ug via INTRAVENOUS

## 2018-01-11 MED ORDER — CHLORHEXIDINE GLUCONATE 4 % EX LIQD: 60.0000 mL | Freq: Once | CUTANEOUS | Status: DC

## 2018-01-11 MED ORDER — PHENYLEPHRINE 40 MCG/ML (10ML) SYRINGE FOR IV PUSH (FOR BLOOD PRESSURE SUPPORT)
PREFILLED_SYRINGE | INTRAVENOUS | Status: DC | PRN
  Administered 2018-01-11: 120 ug via INTRAVENOUS
  Administered 2018-01-11: 80 ug via INTRAVENOUS
  Administered 2018-01-11: 120 ug via INTRAVENOUS

## 2018-01-11 MED ORDER — MEPERIDINE HCL 50 MG/ML IJ SOLN: 6.2500 mg | INTRAMUSCULAR | Status: DC | PRN

## 2018-01-11 MED ORDER — OXYCODONE HCL 5 MG PO TABS: 5.0000 mg | ORAL_TABLET | Freq: Once | ORAL | Status: DC | PRN

## 2018-01-11 MED ORDER — DEXAMETHASONE SODIUM PHOSPHATE 10 MG/ML IJ SOLN
INTRAMUSCULAR | Status: AC
  Filled 2018-01-11: qty 1

## 2018-01-11 MED ORDER — PROPOFOL 10 MG/ML IV BOLUS
INTRAVENOUS | Status: AC
  Filled 2018-01-11: qty 20

## 2018-01-11 MED ORDER — LIDOCAINE 2% (20 MG/ML) 5 ML SYRINGE
INTRAMUSCULAR | Status: DC | PRN
  Administered 2018-01-11: 60 mg via INTRAVENOUS

## 2018-01-11 MED ORDER — ROCURONIUM BROMIDE 50 MG/5ML IV SOSY
PREFILLED_SYRINGE | INTRAVENOUS | Status: AC
  Filled 2018-01-11: qty 5

## 2018-01-11 MED ORDER — FENTANYL CITRATE (PF) 250 MCG/5ML IJ SOLN
INTRAMUSCULAR | Status: AC
  Filled 2018-01-11: qty 5

## 2018-01-11 MED ORDER — OXYCODONE HCL 5 MG/5ML PO SOLN: 5.0000 mg | Freq: Once | ORAL | Status: DC | PRN

## 2018-01-11 MED ORDER — PROMETHAZINE HCL 25 MG/ML IJ SOLN: 6.2500 mg | INTRAMUSCULAR | Status: DC | PRN

## 2018-01-11 MED ORDER — SODIUM CHLORIDE 0.9 % IV SOLN
INTRAVENOUS | Status: DC | PRN
  Administered 2018-01-11: 50 ug/min via INTRAVENOUS

## 2018-01-11 MED ORDER — ROCURONIUM BROMIDE 50 MG/5ML IV SOSY
PREFILLED_SYRINGE | INTRAVENOUS | Status: DC | PRN
  Administered 2018-01-11: 50 mg via INTRAVENOUS

## 2018-01-11 SURGICAL SUPPLY — 43 items
BIT DRILL 5/64X5 DISP (BIT) IMPLANT
BLADE SAG 18X100X1.27 (BLADE) IMPLANT
COVER SURGICAL LIGHT HANDLE (MISCELLANEOUS) IMPLANT
DECANTER SPIKE VIAL GLASS SM (MISCELLANEOUS) IMPLANT
DRAPE INCISE IOBAN 66X45 STRL (DRAPES) IMPLANT
DRAPE ORTHO SPLIT 77X108 STRL (DRAPES)
DRAPE SURG ORHT 6 SPLT 77X108 (DRAPES) IMPLANT
DRAPE U-SHAPE 47X51 STRL (DRAPES) IMPLANT
ELECT BLADE 4.0 EZ CLEAN MEGAD (MISCELLANEOUS)
ELECT NEEDLE TIP 2.8 STRL (NEEDLE) IMPLANT
ELECTRODE BLDE 4.0 EZ CLN MEGD (MISCELLANEOUS) IMPLANT
GLOVE BIOGEL PI ORTHO PRO 7.5 (GLOVE)
GLOVE BIOGEL PI ORTHO PRO SZ8 (GLOVE)
GLOVE ORTHO TXT STRL SZ7.5 (GLOVE) IMPLANT
GLOVE PI ORTHO PRO STRL 7.5 (GLOVE) IMPLANT
GLOVE PI ORTHO PRO STRL SZ8 (GLOVE) IMPLANT
GLOVE SURG ORTHO 8.5 STRL (GLOVE) IMPLANT
GOWN STRL REUS W/ TWL LRG LVL3 (GOWN DISPOSABLE) IMPLANT
GOWN STRL REUS W/ TWL XL LVL3 (GOWN DISPOSABLE) IMPLANT
GOWN STRL REUS W/TWL LRG LVL3 (GOWN DISPOSABLE)
GOWN STRL REUS W/TWL XL LVL3 (GOWN DISPOSABLE)
KIT BASIN OR (CUSTOM PROCEDURE TRAY) IMPLANT
KIT TURNOVER KIT B (KITS) ×2 IMPLANT
NEEDLE 1/2 CIR MAYO (NEEDLE) IMPLANT
NEEDLE HYPO 25GX1X1/2 BEV (NEEDLE) IMPLANT
NS IRRIG 1000ML POUR BTL (IV SOLUTION) IMPLANT
PACK SHOULDER (CUSTOM PROCEDURE TRAY) IMPLANT
PAD ARMBOARD 7.5X6 YLW CONV (MISCELLANEOUS) IMPLANT
SPONGE LAP 18X18 X RAY DECT (DISPOSABLE) IMPLANT
SPONGE LAP 4X18 RFD (DISPOSABLE) IMPLANT
SUCTION FRAZIER HANDLE 10FR (MISCELLANEOUS)
SUCTION TUBE FRAZIER 10FR DISP (MISCELLANEOUS) IMPLANT
SUT FIBERWIRE #2 38 T-5 BLUE (SUTURE)
SUT MNCRL AB 4-0 PS2 18 (SUTURE) IMPLANT
SUT VIC AB 0 CT2 27 (SUTURE) IMPLANT
SUT VIC AB 2-0 CT1 27 (SUTURE)
SUT VIC AB 2-0 CT1 TAPERPNT 27 (SUTURE) IMPLANT
SUT VICRYL 0 CT 1 36IN (SUTURE) IMPLANT
SUTURE FIBERWR #2 38 T-5 BLUE (SUTURE) IMPLANT
SYR CONTROL 10ML LL (SYRINGE) IMPLANT
TOWEL OR 17X26 10 PK STRL BLUE (TOWEL DISPOSABLE) IMPLANT
TOWER CARTRIDGE SMART MIX (DISPOSABLE) IMPLANT
YANKAUER SUCT BULB TIP NO VENT (SUCTIONS) IMPLANT

## 2018-01-11 NOTE — Progress Notes (Signed)
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 Upon induction of anesthesia patient had labile blood pressures with persistent hypotension that responded slowly to pressors. Upon slightly elevating the patient's head into a reclined beach chair position, she again dropped her pressure. In discussion with Dr Sabra Heck of anesthesia we determined that proceeding with elective surgery would place the patient at unacceptable surgical risk.  I plan to contact her primary care and cardiology to discuss today's events and determine if she needs additional work up for her cardiovascular risk. I will also discuss with the patient and her family to determine the next steps for care of her shoulder pain.    Doran Heater. Veverly Fells, MD 01/11/2018 10:40 AM

## 2018-01-11 NOTE — Anesthesia Postprocedure Evaluation (Signed)
Anesthesia Post Note  Patient: Jillian Eaton  Procedure(s) Performed: CANCELLED PROCEDURE     Patient location during evaluation: PACU Anesthesia Type: General Level of consciousness: awake and alert Pain management: pain level controlled Vital Signs Assessment: post-procedure vital signs reviewed and stable Respiratory status: spontaneous breathing, nonlabored ventilation and respiratory function stable Cardiovascular status: blood pressure returned to baseline and stable Postop Assessment: no apparent nausea or vomiting Anesthetic complications: no    Last Vitals:  Vitals:   01/11/18 1205 01/11/18 1217  BP: 114/64 102/66  Pulse:    Resp: 15 17  Temp:    SpO2:      Last Pain:  Vitals:   01/11/18 0851  TempSrc:   PainSc: [name]

## 2018-01-11 NOTE — Interval H&P Note (Signed)
History and Physical Interval Note:  01/11/2018 9:47 AM  Jillian Eaton  has presented today for surgery, with the diagnosis of Right shoulder osteoarthritis  The various methods of treatment have been discussed with the patient and family. After consideration of risks, benefits and other options for treatment, the patient has consented to  Procedure(s): REVERSE SHOULDER ARTHROPLASTY (Right) as a surgical intervention .  The patient's history has been reviewed, patient examined, no change in status, stable for surgery.  I have reviewed the patient's chart and labs.  Questions were answered to the patient's satisfaction.     Geovanny Sartin,STEVEN R

## 2018-01-11 NOTE — Transfer of Care (Signed)
Immediate Anesthesia Transfer of Care Note  Patient: Jillian Eaton  Procedure(s) Performed: CANCELLED PROCEDURE  Patient Location: PACU  Anesthesia Type:General  Level of Consciousness: awake, alert  and oriented  Airway & Oxygen Therapy: Patient Spontanous Breathing and Patient connected to nasal cannula oxygen  Post-op Assessment: Report given to RN, Post -op Vital signs reviewed and stable and Patient moving all extremities X 4  Post vital signs: Reviewed and stable  Last Vitals:  Vitals Value Taken Time  BP 104/69 01/11/2018 11:18 AM  Temp    Pulse 79 01/11/2018 11:18 AM  Resp 15 01/11/2018 11:18 AM  SpO2 98 % 01/11/2018 11:18 AM  Vitals shown include unvalidated device data.  Last Pain:  Vitals:   01/11/18 0851  TempSrc:   PainSc: 7       Patients Stated Pain Goal: 5 (78/41/28 2081)  Complications: No apparent anesthesia complications

## 2018-01-11 NOTE — Anesthesia Procedure Notes (Signed)
Procedure Name: Intubation Date/Time: 01/11/2018 10:08 AM Performed by: Kyung Rudd, CRNA Pre-anesthesia Checklist: Patient identified, Emergency Drugs available, Suction available and Patient being monitored Patient Re-evaluated:Patient Re-evaluated prior to induction Oxygen Delivery Method: Circle system utilized Preoxygenation: Pre-oxygenation with 100% oxygen Induction Type: IV induction Ventilation: Mask ventilation without difficulty Laryngoscope Size: Mac and 4 Grade View: Grade I Tube type: Oral Tube size: 7.0 mm Number of attempts: 1 Airway Equipment and Method: Stylet Placement Confirmation: ETT inserted through vocal cords under direct vision,  positive ETCO2 and breath sounds checked- equal and bilateral Secured at: 20 cm Tube secured with: Tape Dental Injury: Teeth and Oropharynx as per pre-operative assessment

## 2018-01-15 ENCOUNTER — Other Ambulatory Visit: Payer: Self-pay | Admitting: Internal Medicine

## 2018-01-15 ENCOUNTER — Encounter: Payer: Self-pay | Admitting: Internal Medicine

## 2018-01-15 ENCOUNTER — Ambulatory Visit (INDEPENDENT_AMBULATORY_CARE_PROVIDER_SITE_OTHER): Payer: Medicare Other | Admitting: Internal Medicine

## 2018-01-15 VITALS — BP 150/100 | HR 91 | Ht 65.0 in | Wt 306.0 lb

## 2018-01-15 DIAGNOSIS — I428 Other cardiomyopathies: Secondary | ICD-10-CM | POA: Diagnosis not present

## 2018-01-15 DIAGNOSIS — I4891 Unspecified atrial fibrillation: Secondary | ICD-10-CM

## 2018-01-15 DIAGNOSIS — I5032 Chronic diastolic (congestive) heart failure: Secondary | ICD-10-CM

## 2018-01-15 DIAGNOSIS — Z01810 Encounter for preprocedural cardiovascular examination: Secondary | ICD-10-CM | POA: Diagnosis not present

## 2018-01-15 NOTE — Patient Instructions (Signed)
Medication Instructions:  Continue current medications If you need a refill on your cardiac medications before your next appointment, please call your pharmacy.   Lab work: NONE If you have labs (blood work) drawn today and your tests are completely normal, you will receive your results only by: Marland Kitchen MyChart Message (if you have MyChart) OR . A paper copy in the mail If you have any lab test that is abnormal or we need to change your treatment, we will call you to review the results.  Testing/Procedures: Your physician has requested that you have an echocardiogram. Echocardiography is a painless test that uses sound waves to create images of your heart. It provides your doctor with information about the size and shape of your heart and how well your heart's chambers and valves are working. This procedure takes approximately one hour. There are no restrictions for this procedure. -- prior to Dec 5 -- [address] - [address]rd Floor  Follow-Up: April 2020 with Dr. [name]

## 2018-01-15 NOTE — Progress Notes (Signed)
OFFICE NOTE  Chief Complaint:  Follow-up surgery  Primary Care Physician: [name], MD  HPI:  Jillian Eaton is a 67 year old female I saw a few weeks ago with a history of super morbid obesity, fibromyalgia and increasing shortness of breath. She had a cath in 2012 which showed normal coronaries, mildly elevating filling pressures, and diastolic pressures with a mean PA of 31. This correlated with her echocardiogram when RV systolic pressures in 7322 were 49 on echo. A repeat echocardiogram was just performed which demonstrated a preserved LVEF of 60-65% with moderate concentric LVH. There was mild to moderate increase in pulmonary artery pressure with peak at 51, which has not significantly changed from her study 1 year ago. Although the pressures look very similar her shortness of breath has been increasing significantly, and I recommended that she wear oxygen at night since she had that at home which does seem to be helping her at least feel better during the day as I suspect she has sleep apnea. She has been tested before which was apparently negative and is considering retesting at some point in the future. Overall I think the main issue obviously is weight, and she unfortunately says that she is not a candidate for gastric bypass and therefore it is a very difficult situation.  I referred her back to her pulmonologist in [facility] for ongoing evaluation of pulmonary hypertension. She tells me that she was then referred to [facility] and saw a specialist there who did another right heart catheterization but did not recommend any medications other than better blood pressure control.   In addition she had problems with kidney stones and underwent 2 operations regarding tthis.  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  She is now dependent on thyroid medication. Unfortunately she's not been able to lose weight, but her weight is fairly  stable around 400 pounds as is her shortness of breath.  Cath in 2012:  LEFT HEART CATHETERIZATION  OPERATOR: Mali Abed Schar, MD, and Rolland Porter, MD  INDICATION: Dyspnea on exertion and chest pressure.  HISTORY OF PRESENT ILLNESS: Jillian Eaton is a morbidly obese (BMI greater than 21) female with a history of failed gastric bypass x2 with an increasing weight gain and increasing shortness of breath as well as new- onset dyspnea on exertion. She reports that she can only walk about 10 feet before she becomes short of breath and has chest pressure which she says get better with rest. She has numerous cardiac risk factors and given the high likelihood of false positive stress test, I have referred her for cardiac catheterization, both left and right heart as she has had elevated RVSP on echocardiogram of approximately 30 to 31 mmHg.  PROCEDURE: The patient was brought into the cardiac catheterization lab, sterilely prepped and draped in the usual fashion. The area around the right femoral artery and right brachial vein were cleansed and draped to allow an attempt at radial arterial and brachiocephalic venous access. IV was not able to be obtained prior to the procedure given her body habitus. With difficulty in assessing the vein, the ultrasound was eventually used to identify the right brachiocephalic vein, however, cannulation with needle and wire was not possible as the vein was very small in caliber. After mild local bleeding was controlled, we did turn our attention to the right femoral vein and with great difficulty using the ultrasound, the right femoral vein was accessed by Dr.  Ellyn Hack using a straight wire and a needle. After venous access was obtained, the attention was turned to the right radial artery by Dr. Ellyn Hack and simultaneous to that right heart catheterization was performed by myself without any difficulty. The right radial artery was  successfully cannulated and subsequently left coronary artery system was selectively injected with a 5-French TIG 4.0 catheter, however the right coronary artery could not be cannulated with the TIG catheter and was eventually cannulated with a JR-4 catheter. LV pressure was measured with a pigtail catheter. Estimated blood loss was about 30 mL. There were no acute complications. The patient received a total of 9 mg of Versed throughout the procedure as well as 225 mcg of fentanyl and was at no point greater than moderately sedated. She received 5000 units of heparin about 10 mL of a radial cocktail.  FINDINGS: 1. Left main - short, no disease. 2. LAD - no significant disease. 3. Left circumflex, no significant disease. 4. RCA - dominant, no disease, large-caliber vessel. 5. LVEDP = 20 mmHg. 6. RA - 12. 7. RV 38/12. 8. PA - 43/19 (31). 9. PCWP - 24. 10.TPG - 7. 11.Fick cardiac output/Fick cardiac index - 10.56/3.84. 12.Thermodilution cardiac output/thermodilution cardiac index -  6.78/2.47. 13.Aortic saturation - 94%. 14.PA saturation - 68%.  IMPRESSION: 1. No significant obstructive coronary artery disease. 2. LVEDP = 20 mmHg. 3. Borderline pulmonary venous hypertension. 4. High cardiac output.  Jillian Eaton returns today for follow-up. She reports that her shortness of breath has not significantly worsened, in fact, possibly is slightly better. She did have thyroid surgery last year at [facility] and apparently underwent right heart catheterization prior to that. I do not have those records immediately available. Unfortunately she continues to maintain her weight and has not been able to lose any. She is complaining of some numbness and tingling in her feet which is likely neuropathy. She is on medication including Lyrica which she takes for fibromyalgia.  Jillian Eaton returns today for follow-up. She recently is been having more shortness of breath  and lower extremity swelling. She pointed out edema in her legs with very small blisters and some chronic venous stasis changes. She is not currently on a diuretic. She recently saw another new primary care provider with the [facility]. She was noted to be started on lisinopril 40 mg daily, which she is taking in addition to losartan 100 mg daily. The notes do not indicate from her office visit why she was started on lisinopril, but I can see through care everywhere that this was ordered by her primary care provider. She also takes amlodipine for blood pressure control. Her blood pressure was elevated initially at 178/94, but after resting came down to 120/78 and is at goal today. It is unusual, however to use both ACE inhibitors and ARBs.  07/02/2015  Jillian Eaton returns today for follow-up. She underwent a repeat echocardiogram for progressive dyspnea and exertion which showed a preserved LVEF of 6065% however there is moderate to severe pulmonary hypertension with an RVSP of 64 mmHg. This is increased about 10 mmHg compared to her prior study. Her mean pulmonary pressure by catheterization in 2012 was only 31 mmHg. I suspect that progressive pulmonary hypertension as a cause of her worsening shortness of breath. In fact, during recent hospitalization for surgery she required discharged with oxygen. She says she rarely uses oxygen at home but notes that she is hypoxic often when she checks her oxygen  levels. At her last office visit I recommended Lasix which she has been using sparingly. She has problems with incontinence and she does report an improvement in her swelling with it but does not notice significant change in her shortness of breath.  06/06/2016  Jillian Eaton returns from hospital follow-up. She was just admitted after an admission in January for unintentional narcotic overdose. She had unresponsiveness and possible aspiration. There was a second admission for a similar presentation  with respiratory failure, fever, sepsis and ultimately she was found to have a new onset cardiomyopathy with EF as low as 25%. Was felt that this was nonischemic. She was having intermittent atrial tachycardia and was felt that this might need to be managed with antiarrhythmic therapy however it seems to have resolved with carvedilol. She was placed on diuretics and reports that her breathing is close to baseline. She remains hypoxic with an O2 saturation 93% however was on home oxygen is result of her severe pulmonary hypertension. She does not feel that she needs to use the oxygen very regularly. From a heart failure standpoint she is on carvedilol, aspirin, furosemide and not currently on an ACE inhibitor, ARB or Entresto. Recent testing a renal function shows normal creatinine.  10/11/2016  Jillian Eaton is seen today in follow-up. In July she was hospitalized for hyperkalemia. She is on supplemental potassium and lisinopril, both were stopped. Fortunately her echo has improved back to normal recently. Overall she feels well. Her weight is down about 40 pounds of the recently she gained about 20 pounds back. She is working on that right as we speak.  04/03/2017  Jillian Eaton was seen today in follow-up.  She recently called in and had complaints of worsening shortness of breath.  She saw her pulmonary doctor who did an x-ray noted a mild right upper lobe infiltrate versus edema.  Labs were obtained including BNP which was only mildly elevated at 110.  She was given 20 mg of Lasix with no benefit and then recently was advised over the phone to increase her Lasix to 40 mg for 2 days.  She urinated quite a bit and lost about 5 pounds of what she feels was water weight.  She reports mild improvement in her breathing.  She is quite anxious about what might have led to this fluid gain.  Given her history of cardiomyopathy, it is possible she could have had some recurrent cardiomyopathy.  She reports stable diet and  no worsening abuse of sodium.  06/19/2017  Jillian Eaton returns today for follow-up.  She was supposed to have a limited echocardiogram but that never happened.  Approximately 2 to 3 weeks ago she called the office reporting palpitations.  We try to schedule an earlier appointment, but she ended up going to [facility] her [facility].  On the flight she apparently became short of breath had some left flank pain and was hypoxic.  She was given oxygen and after landing they took her to the [facility] in [place].  She was treated there and found to be in A. fib with RVR.  This is a new finding.  She had a remote history of either PAT or PAF, but so brief that she was not anticoagulated.  She was then started on Xarelto.  She tells me she was given Lasix and diuresed.  She returns today for follow-up and still reports some shortness of breath.  Weight is about 6 pounds heavier than she was 2 months ago.  She is in A. fib persistently with heart rates in the low 100s.  In addition she reports after starting the Xarelto that she has been having some vaginal bleeding.  07/26/2017  Jillian Eaton returns today for follow-up.  She was recently seen in the hospital and discharged on 07/17/2017.  Today is a transition of care follow-up.  She was contacted on the day after discharge and felt to be doing well.  She is currently in rehabilitation.  She says she has had marked improvement in her shortness of breath.  She feels like her edema has improved.  She has decreased her dose of daily Lasix from 80 mg to 40 mg.  She is in persistent atrial fibrillation however rate controlled on amiodarone 400 mg which she is taking twice daily.  She reports she is getting stronger and hopefully will be out of rehabilitation within the next week.  She did miss 1 dose of Eliquis due to a transfer issue on May 28 and therefore will need 3 weeks of subsequent uninterrupted anticoagulation prior to attempted  cardioversion.  09/13/2017  Jillian Eaton returns today for follow-up of cardioversion.  Unfortunately this was unsuccessful after 3 shocks.  She told me there is some confusion afterwards that she forgot to take her amiodarone.  She was taking it up until the cardioversion.  Since it was unsuccessful, I recommended rate control strategy.  At this point there is little benefit from being on amiodarone and the side effect profile would not make it favorable.  I would recommend just continuing carvedilol for rate control.  She is had significant weight loss now down to 313 from 340.  This will continue to help her symptoms.  Her shortness of breath has improved.  She has had no significant worsening edema.  No further significant GU bleeding.  09/28/2017  Jillian Eaton returns today for follow-up of multiple ER visits.  I last saw her a couple weeks ago and unfortunately she was maintaining A. fib after unsuccessful cardioversion.  She continues to lose weight.  In fact she is down to 306 pounds today from 313.  Lab work indicates a rising creatinine and very low BNP, which makes me feel that she may be over diuresed.  She is also noted to be anemic with iron deficiency and is anticoagulated on Eliquis.  Her PCP is likely to start her on iron.  She denies any blood in the stool however stool guaiacs or work-up for microscopic iron is indicated.  She reports intermittent hematuria which is been minor.  Blood pressure is noted to be low today 94/69.  She is on low-dose carvedilol for rate control as well as hydrochlorothiazide and low-dose Lasix.  Recently she said she has had improvement in her breathing and for the first time the other day was able to sleep without oxygen.  10/12/2017  Jillian Eaton was seen again today in follow-up.  She called the office that she noted to have significant weight gain.  In July she was 313 and had lost weight down to 297.  Subsequently was found out that she was possibly overtreated  with her thyroid medication and her dose was decreased from 300 mcg to 200 mcg daily by her endocrinologist.  Since then she has gained weight but she has been up 12 pounds over the past 2 weeks.  She does report some edema.  I had stopped her hydrochlorothiazide due to hypotension and blood pressure is better today 112/70.  She takes low-dose  Lasix 20 mg daily.  She also reported that she now has stage III chronic kidney disease.  12/13/2017  Jillian Eaton returns today for follow-up.  She is done well and felt very stable.  Although her weight is up about 10 pounds she denies any worsening fluid.  She remains on 20 mg Lasix daily.  She is been working with her endocrinologist who reduced her levothyroxine.  Some of the weight gain may be related to that as she was on a very high dose of 300 mcg daily.  She denies any worsening shortness of breath.  She is having problems with her right shoulder and is scheduled to go arthroplasty by Dr. Alma Friendly.  She is here today for preoperative risk assessment.  She denies any chest pain.  She is on Eliquis for A. fib which is persistent but rate control.  She would have to stop this typically about 3 days prior to the procedure.  01/15/2018  Jillian Eaton is seen today for follow-up of recent surgery.  Actually she did not have surgery rather was induced by anesthesia and then had significant hypotension and instability requiring IV fluids and pressors.  Based on that response anesthesia recommended that the surgery be canceled.  I spoke with Dr. Alma Friendly subsequent to that about the episode and agreed to see her back to help determine why she may have had such a response.  She actually had significant improvement in LV function by echo 6 months ago back to normal.  She has not had recurrent A. fib and has been euvolemic.  She is on a diuretic.  Weight has been going back up however this is been related to changing her thyroid medications as she was iatrogenically hyperthyroid.   Stress testing was not pursued because she has a history of normal coronaries by cath in 2012 and since her EF improved recently with medical therapy alone, this would have been unlikely if she had significant coronary disease.  I suspect that her hypotension was due to being volume depleted and that she had a marked response to the propofol leading to her hypotension.  PMHx:  Past Medical History:  Diagnosis Date  . Anemia   . Asthma    flare up last yr  . CHF (congestive heart failure) ([place])   . Chronic headache   . DOE (dyspnea on exertion)    2D ECHO, 02/12/2012 - EF 60-65%, moderate concentric hypertrophy  . Fibromyalgia    nerve pain"left side at waist level" "can't lay on that side without pain" , "HOB elevation helps"  . Heart murmur   . Hematuria - cause not known   . History of kidney stones    x 2 '13, '14 surgery to remove  . Hypertension   . SBO (small bowel obstruction) ([place]) 06/07/2013  . Sleep apnea    tested several times and came up negative  . Thyroid disease    "goiter"  . Transfusion history    10 yrs+    Past Surgical History:  Procedure Laterality Date  . CARDIAC CATHETERIZATION  04/04/2010   No significant obstructive coronary artery disease  . CARDIOVERSION N/A 08/08/2017   Procedure: CARDIOVERSION;  Surgeon: Pixie Casino, MD;  Location: [facility];  Service: Cardiovascular;  Laterality: N/A;  . CHOLECYSTECTOMY  1990  . COLONOSCOPY W/ POLYPECTOMY    . COLONOSCOPY WITH PROPOFOL N/A 04/10/2014   Procedure: COLONOSCOPY WITH PROPOFOL;  Surgeon: Beryle Beams, MD;  Location: [facility];  Service: Endoscopy;  Laterality:  N/A;  . DIAGNOSTIC LAPAROSCOPY     x2 bowel obstructions(adhesions)  . EYE SURGERY     lasik 20-25 yrs ago  . 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 THYROIDECTOMY    . TUBAL LIGATION  1986    FAMHx:  Family History  Problem Relation Age of Onset  . Diabetes Mother   . Epilepsy Mother    . Cancer Mother        Breast  . Hypertension Mother   . Breast cancer Mother   . Kidney disease Father   . Diabetes Father   . Hypertension Father   . Asthma Father   . Heart disease Father   . Epilepsy Sister   . Cancer Maternal Grandmother   . Breast cancer Maternal Grandmother   . Cancer Paternal Grandmother   . Breast cancer Paternal Grandmother     SOCHx:   reports that she has never smoked. She has never used smokeless tobacco. She reports that she does not drink alcohol or use drugs.  ALLERGIES:  Allergies  Allergen Reactions  . Norco [Hydrocodone-Acetaminophen] Itching    ROS: Pertinent items noted in HPI and remainder of comprehensive ROS otherwise negative.  HOME MEDS: Current Outpatient Medications  Medication Sig Dispense Refill  . acetaminophen (TYLENOL) 650 MG CR tablet Take 1,300 mg by mouth 3 (three) times daily.     Marland Kitchen albuterol (PROVENTIL HFA;VENTOLIN HFA) 108 (90 Base) MCG/ACT inhaler Inhale 1-2 puffs into the lungs every 6 (six) hours as needed for wheezing or shortness of breath.    Marland Kitchen apixaban (ELIQUIS) 5 MG TABS tablet Take 1 tablet (5 mg total) by mouth 2 (two) times daily. 180 tablet 3  . budesonide-formoterol (SYMBICORT) 160-4.5 MCG/ACT inhaler Inhale 2 puffs into the lungs 2 (two) times daily as needed (shortness of breath.).     Marland Kitchen calcitRIOL (ROCALTROL) 0.5 MCG capsule TAKE 1 CAPSULE BY MOUTH 2 TIMES DAILY. 180 capsule 0  . calcium carbonate (OS-CAL - DOSED IN MG OF ELEMENTAL CALCIUM) 1250 (500 Ca) MG tablet Take 1 tablet (1,250 mg total) by mouth 3 (three) times daily with meals. (Patient taking differently: Take 3 tablets by mouth 2 (two) times daily. ) 90 tablet 0  . carvedilol (COREG) 3.125 MG tablet Take 1 tablet (3.125 mg total) by mouth 2 (two) times daily with a meal. 60 tablet 0  . Cyanocobalamin (VITAMIN B-12 IJ) Inject 1,000 mcg as directed every 30 (thirty) days. Monthly injections     . cyclobenzaprine (FLEXERIL) 10 MG tablet Take 10 mg  by mouth 3 (three) times daily as needed for spasms.  0  . DULoxetine (CYMBALTA) 60 MG capsule Take 1 capsule (60 mg total) by mouth daily. 30 capsule 0  . eszopiclone (LUNESTA) 2 MG TABS tablet Take 2 mg by mouth at bedtime.  1  . furosemide (LASIX) 20 MG tablet Take 20 mg by mouth daily.    . iron polysaccharides (NIFEREX) 150 MG capsule Take 150 mg by mouth 2 (two) times daily.    Marland Kitchen levothyroxine (SYNTHROID, LEVOTHROID) 200 MCG tablet Take 200 mcg by mouth daily before breakfast.    . LYRICA 200 MG capsule Take 200 mg by mouth 2 (two) times daily.   2  . magnesium oxide (MAG-OX) 400 MG tablet Take 400 mg by mouth daily.  0  . methocarbamol (ROBAXIN) 500 MG tablet Take 500 mg by mouth at bedtime.  0  . mirabegron ER (MYRBETRIQ)  50 MG TB24 tablet Take 50 mg by mouth daily.    . OXYGEN Inhale 2 L/min into the lungs as needed (shortness of breath).     . senna (SENOKOT) 8.6 MG TABS tablet Take 1 tablet (8.6 mg total) by mouth daily. 120 each 0  . traMADol (ULTRAM) 50 MG tablet Take 50 mg by mouth every 6 (six) hours as needed for pain.  0  . traZODone (DESYREL) 50 MG tablet Take 50 mg by mouth at bedtime.   0  . Vitamin D, Ergocalciferol, (DRISDOL) 50000 units CAPS capsule Take 50,000 Units by mouth every Wednesday.      No current facility-administered medications for this visit.     LABS/IMAGING: No results found for this or any previous visit (from the past 48 hour(s)). No results found.  VITALS: BP (!) 150/100   Pulse 91   Ht 5\' 5"  (1.651 m)   Wt (!) 306 lb (138.8 kg)   BMI 50.92 kg/m   EXAM: General appearance: alert, morbidly obese and in wheelchair Neck: no carotid bruit and no JVD Lungs: diminished breath sounds bilaterally Heart: regular rate and rhythm Abdomen: soft, non-tender; bowel sounds normal; no masses,  no organomegaly Extremities: edema trace bilateral edema Pulses: 2+ and symmetric Skin: Skin color, texture, turgor normal. No rashes or lesions Neurologic:  Grossly normal Psych: Pleasant  EKG: Deferred  ASSESSMENT: 1. Anesthesia-associated hypotension 2. New onset A. fib with RVR-CHADSVASC score of 4 -failed cardioversion on amiodarone 3. Acute diastolic congestive heart failure-LVEF 25% (improved to 60-65% in 06/2017) 4. Super morbid obesity, with failure of 3 gastric bypass procedures 5. Pulmonary hypertension - PA pressure of 64 mmHg, normal LV systolic function 6. Progressive DOE 7. Hypertension-controlled  8. Possible A. fib/ectopic atrial tachycardia-resolved  PLAN: 1.   Ms. Poch had hypotension in the setting of anesthesia and induction with propofol.  Suspect this is due to hypovolemia.  It may be related to the fact that she is continued on Lasix despite improvement in LVEF up to 60 to 65%.  She has no detectable congestive heart failure symptoms now and weight is up a couple pounds however I think this is not edema.  I would recommend that we repeat an echocardiogram just to make sure her LV function is been stable since her last study 6 months ago.  I would then look to see if her IVC is small or collapses suggesting volume depletion.  If this is the case we can reduce her diuretic to either as needed or certainly hold it 3 days prior to a repeat attempted surgery.  She will probably need to be well-hydrated for surgery to avoid any of these future complications.  A regional block may be safer to avoid this however it comes with its own set of possible complications.  We will contact her with results of the echo.  Otherwise follow-up as previously scheduled.  Pixie Casino, MD, [facility] [place] Director of the [facility] Lipid [facility] &  Cardiovascular Risk Reduction Clinic Diplomate of the American Board of Clinical Lipidology Attending Cardiologist  Direct Dial: [phone]  Fax: [phone]  Website:  [email or web address] [email or web address] Abebe 01/15/2018, 3:02 PM

## 2018-01-22 ENCOUNTER — Other Ambulatory Visit (HOSPITAL_COMMUNITY): Payer: Medicare Other

## 2018-01-23 ENCOUNTER — Other Ambulatory Visit (HOSPITAL_COMMUNITY): Payer: Medicare Other

## 2018-01-24 DIAGNOSIS — R319 Hematuria, unspecified: Secondary | ICD-10-CM | POA: Diagnosis not present

## 2018-01-24 DIAGNOSIS — E875 Hyperkalemia: Secondary | ICD-10-CM | POA: Diagnosis not present

## 2018-01-24 DIAGNOSIS — N183 Chronic kidney disease, stage 3 (moderate): Secondary | ICD-10-CM | POA: Diagnosis not present

## 2018-01-24 DIAGNOSIS — I129 Hypertensive chronic kidney disease with stage 1 through stage 4 chronic kidney disease, or unspecified chronic kidney disease: Secondary | ICD-10-CM | POA: Diagnosis not present

## 2018-01-24 DIAGNOSIS — I509 Heart failure, unspecified: Secondary | ICD-10-CM | POA: Diagnosis not present

## 2018-01-24 DIAGNOSIS — D631 Anemia in chronic kidney disease: Secondary | ICD-10-CM | POA: Diagnosis not present

## 2018-02-01 ENCOUNTER — Other Ambulatory Visit: Payer: Self-pay

## 2018-02-01 ENCOUNTER — Ambulatory Visit (HOSPITAL_COMMUNITY): Payer: Medicare Other | Attending: Cardiology

## 2018-02-01 DIAGNOSIS — I428 Other cardiomyopathies: Secondary | ICD-10-CM | POA: Diagnosis not present

## 2018-02-01 DIAGNOSIS — Z01810 Encounter for preprocedural cardiovascular examination: Secondary | ICD-10-CM

## 2018-02-07 DIAGNOSIS — S46011D Strain of muscle(s) and tendon(s) of the rotator cuff of right shoulder, subsequent encounter: Secondary | ICD-10-CM | POA: Diagnosis not present

## 2018-02-07 DIAGNOSIS — M19011 Primary osteoarthritis, right shoulder: Secondary | ICD-10-CM | POA: Diagnosis not present

## 2018-02-07 DIAGNOSIS — M25812 Other specified joint disorders, left shoulder: Secondary | ICD-10-CM | POA: Diagnosis not present

## 2018-02-18 ENCOUNTER — Telehealth: Payer: Self-pay | Admitting: *Deleted

## 2018-02-18 NOTE — Telephone Encounter (Signed)
Pharmacy please comment on Eliquis and then I will contact the patient about pre op clearance.   Kerin Ransom PA-C 02/18/2018 4:50 PM

## 2018-02-18 NOTE — Telephone Encounter (Signed)
   [facility] Pre-operative Risk Assessment    Request for surgical clearance:  1. What type of surgery is being performed? Right reverse total shoulder   2. When is this surgery scheduled? TBD   3. What type of clearance is required (medical clearance vs. Pharmacy clearance to hold med vs. Both)? both  4. Are there any medications that need to be held prior to surgery and how long? Eliquis   5. Practice name and name of physician performing surgery? [facility] Dr. Veverly Fells   6. What is your office phone number [phone]    7.   What is your office fax number [phone] Attn: [name]  8.   Anesthesia type (None, local, MAC, general) ? General/ISB   Jillian Eaton 02/18/2018, 11:01 AM  _________________________________________________________________   (provider comments below)

## 2018-02-19 NOTE — Telephone Encounter (Signed)
Patient with diagnosis of atrial fibrillation on Eliquis for anticoagulation.    Procedure: right reverse total shoulder Date of procedure: TBD  CHADS2-VASc score of  4 (CHF, HTN, AGE, , female)  CrCl 99.1 Platelet count 161  Per office protocol, patient can hold Eliquis for 3 days prior to procedure.    Patient should restart Eliquis on the day after, at discretion of procedure MD  For orthopedic procedures please be sure to resume therapeutic (not prophylactic) dosing.

## 2018-02-21 NOTE — Telephone Encounter (Signed)
I've tried calling this number twice. Both times a female answered and I was really unable to carry on a conversation with him because of distractions on his end. I'm not even sure this is the correct number for this patient.  Kerin Ransom PA-C 02/21/2018 2:55 PM

## 2018-02-25 NOTE — Telephone Encounter (Signed)
68 yo female with obesity, pulmonary HTN, chronic respiratory failure on O2, normal coronary arteries by LHC in 2012, non-ischemic CM with EF 25 in 2018 (noted after accidental narcotic OD), parox ATach, permanent AFib on Apixaban for anticoagulation.  EF has returned to normal.  Patient was just seen by Dr. [name] 12/2017 after initial attempt at shoulder surgery was canceled due to low BP with anesthesia induction.  A follow up echo 02/01/18 demonstrated an EF of 55-60, mild to mod MR, mild LAE, mod reduced RVSF, mod RVE, PASP 62.  Patient has been previously cleared by Dr. [name] to hold Apixaban for 3 days prior to procedure.  PharmD has again confirmed it is appropriate to hold Apixaban for 3 days prior to procedure.  As patient was just seen by Dr. [name], I will forward this not to him to make sure it is ok to go ahead and clear her for her upcoming shoulder surgery.  Dr. [name] >> Is it ok to clear her to proceed with her shoulder surgery?  Richardson Dopp, PA-C    02/25/2018 2:45 PM

## 2018-02-26 DIAGNOSIS — I4821 Permanent atrial fibrillation: Secondary | ICD-10-CM | POA: Diagnosis not present

## 2018-02-26 DIAGNOSIS — E8770 Fluid overload, unspecified: Secondary | ICD-10-CM | POA: Diagnosis not present

## 2018-02-26 DIAGNOSIS — D519 Vitamin B12 deficiency anemia, unspecified: Secondary | ICD-10-CM | POA: Diagnosis not present

## 2018-02-26 DIAGNOSIS — I509 Heart failure, unspecified: Secondary | ICD-10-CM | POA: Diagnosis not present

## 2018-02-26 DIAGNOSIS — I10 Essential (primary) hypertension: Secondary | ICD-10-CM | POA: Diagnosis not present

## 2018-02-26 NOTE — Telephone Encounter (Signed)
   Call back staff: Marland Kitchen Clearance letter has been faxed to the requesting surgeon. . Please contact the surgeon's office to ensure it has been received. . This phone note will be removed from the preop pool. . Please sign encounter when completed.  Richardson Dopp, PA-C    02/26/2018 2:10 PM

## 2018-02-26 NOTE — Telephone Encounter (Signed)
Spoke with Ron from [employer], Dr. Veverly Fells office who states that they haven't received any notes in the chart as of now and try calling again tomorrow.

## 2018-02-26 NOTE — Telephone Encounter (Signed)
Ok to proceed with surgery - hold Eliquis 3 days prior.  Dr. Lemmie Evens

## 2018-02-28 NOTE — Telephone Encounter (Signed)
Left message on surgery schedulers vm instructing her to call our office and let us know if they have received pt's clearance

## 2018-03-13 NOTE — H&P (Signed)
Anticipated LOS equal to or greater than 2 midnights due to - Age 66 and older with one or more of the following:  - Obesity  - Expected need for hospital services (PT, OT, Nursing) required for safe  discharge  - Anticipated need for postoperative skilled nursing care or inpatient rehab  - Active co-morbidities: Diabetes OR   Plan for SNF placement after surgery    Jillian Eaton is an 68 y.o. female.    Chief Complaint: right shoulder pain  HPI: Pt is a 68 y.o. female complaining of right shoulder pain for multiple years. Pain had continually increased since the beginning. X-rays in the clinic show end-stage arthritic changes of the right shoulder. Pt has tried various conservative treatments which have failed to alleviate their symptoms, including injections and therapy. Various options are discussed with the patient. Risks, benefits and expectations were discussed with the patient. Patient understand the risks, benefits and expectations and wishes to proceed with surgery.   PCP:  [name], MD  D/C Plans: Home  PMH: Past Medical History:  Diagnosis Date  . Anemia   . Asthma    flare up last yr  . CHF (congestive heart failure) ([place])   . Chronic headache   . DOE (dyspnea on exertion)    2D ECHO, 02/12/2012 - EF 60-65%, moderate concentric hypertrophy  . Fibromyalgia    nerve pain"left side at waist level" "can't lay on that side without pain" , "HOB elevation helps"  . Heart murmur   . Hematuria - cause not known   . History of kidney stones    x 2 '13, '14 surgery to remove  . Hypertension   . SBO (small bowel obstruction) ([place]) 06/07/2013  . Sleep apnea    tested several times and came up negative  . Thyroid disease    "goiter"  . Transfusion history    10 yrs+    PSH: Past Surgical History:  Procedure Laterality Date  . CARDIAC CATHETERIZATION  04/04/2010   No significant obstructive coronary artery disease  . CARDIOVERSION N/A 08/08/2017   Procedure: CARDIOVERSION;  Surgeon: Pixie Casino, MD;  Location: [facility];  Service: Cardiovascular;  Laterality: N/A;  . CHOLECYSTECTOMY  1990  . COLONOSCOPY W/ POLYPECTOMY    . COLONOSCOPY WITH PROPOFOL N/A 04/10/2014   Procedure: COLONOSCOPY WITH PROPOFOL;  Surgeon: Beryle Beams, MD;  Location: [facility];  Service: Endoscopy;  Laterality: N/A;  . DIAGNOSTIC LAPAROSCOPY     x2 bowel obstructions(adhesions)  . EYE SURGERY     lasik 20-25 yrs ago  . 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 THYROIDECTOMY    . TUBAL LIGATION  1986    Social History:  reports that she has never smoked. She has never used smokeless tobacco. She reports that she does not drink alcohol or use drugs.  Allergies:  Allergies  Allergen Reactions  . Norco [Hydrocodone-Acetaminophen] Itching    Medications: No current facility-administered medications for this encounter.    Current Outpatient Medications  Medication Sig Dispense Refill  . acetaminophen (TYLENOL) 650 MG CR tablet Take 1,300 mg by mouth 2 (two) times daily.     Marland Kitchen albuterol (PROVENTIL HFA;VENTOLIN HFA) 108 (90 Base) MCG/ACT inhaler Inhale 1-2 puffs into the lungs every 6 (six) hours as needed for wheezing or shortness of breath.    Marland Kitchen apixaban (ELIQUIS) 5 MG TABS tablet Take 1 tablet (5 mg total) by mouth 2 (  two) times daily. 180 tablet 3  . budesonide-formoterol (SYMBICORT) 160-4.5 MCG/ACT inhaler Inhale 2 puffs into the lungs 2 (two) times daily as needed (for shortness of breath or wheezing).     . calcitRIOL (ROCALTROL) 0.5 MCG capsule TAKE 1 CAPSULE BY MOUTH 2 TIMES DAILY. (Patient taking differently: Take 0.5 mcg by mouth 2 (two) times daily. ) 180 capsule 0  . calcium carbonate (OS-CAL - DOSED IN MG OF ELEMENTAL CALCIUM) 1250 (500 Ca) MG tablet Take 1 tablet (1,250 mg total) by mouth 3 (three) times daily with meals. (Patient taking differently: Take 2 tablets by mouth 2 (two) times daily. )  90 tablet 0  . carvedilol (COREG) 3.125 MG tablet Take 1 tablet (3.125 mg total) by mouth 2 (two) times daily with a meal. 60 tablet 0  . cyanocobalamin (,VITAMIN B-12,) 1000 MCG/ML injection Inject 1,000 mcg into the muscle every 30 (thirty) days.    . cyclobenzaprine (FLEXERIL) 10 MG tablet Take 20 mg by mouth 2 (two) times daily.   0  . diclofenac sodium (VOLTAREN) 1 % GEL Apply 1 application topically 2 (two) times daily as needed (for knee pain).    . DULoxetine (CYMBALTA) 60 MG capsule Take 1 capsule (60 mg total) by mouth daily. 30 capsule 0  . eszopiclone (LUNESTA) 2 MG TABS tablet Take 2 mg by mouth at bedtime.  1  . furosemide (LASIX) 20 MG tablet Take 20 mg by mouth daily.    Marland Kitchen levothyroxine (SYNTHROID, LEVOTHROID) 200 MCG tablet Take 200 mcg by mouth daily before breakfast.    . LYRICA 200 MG capsule Take 200 mg by mouth 2 (two) times daily.   2  . magnesium oxide (MAG-OX) 400 MG tablet Take 400 mg by mouth daily.  0  . mirabegron ER (MYRBETRIQ) 50 MG TB24 tablet Take 50 mg by mouth daily.    Marland Kitchen senna (SENOKOT) 8.6 MG TABS tablet Take 1 tablet (8.6 mg total) by mouth daily. 120 each 0  . traMADol (ULTRAM) 50 MG tablet Take 50 mg by mouth every 6 (six) hours as needed for pain.  0  . traZODone (DESYREL) 50 MG tablet Take 50 mg by mouth at bedtime.   0  . Vitamin D, Ergocalciferol, (DRISDOL) 50000 units CAPS capsule Take 50,000 Units by mouth every Wednesday.       No results found for this or any previous visit (from the past 48 hour(s)). No results found.  ROS: Pain with rom of the right upper extremity  Physical Exam: Alert and oriented 68 y.o. female in no acute distress Cranial nerves 2-12 intact Cervical spine: full rom with no tenderness, nv intact distally Chest: active breath sounds bilaterally, no wheeze rhonchi or rales Heart: regular rate and rhythm, no murmur Abd: non tender non distended with active bowel sounds Hip is stable with rom  Right shoulder with  limited rom due to pain and guarding nv intact distally Limited strength with ER/IR  Assessment/Plan Assessment: right shoulder cuff arthropathy  Plan:  Patient will undergo a right reverse total shoulder by Dr. Veverly Fells at [facility]. Risks benefits and expectations were discussed with the patient. Patient understand risks, benefits and expectations and wishes to proceed. Preoperative templating of the joint replacement has been completed, documented, and submitted to the Operating Room personnel in order to optimize intra-operative equipment management.   Merla Riches PA-C, MPAS [facility] is now [employer] [address], [place], [place], [place] [address] Phone: [phone] [email or web address] Facebook  Instagram  [employer]

## 2018-03-20 NOTE — Pre-Procedure Instructions (Signed)
Cleta Gaynelle Lubke  03/20/2018      [id number] [place] [place] [address] [place] [address] Phone: [phone] Fax: [phone]  [facility] [place] [place] [facility] [address] [place] [address] Phone: [phone] Fax: [phone]    Your procedure is scheduled on Friday February 7th.  Report to [facility] Admitting at 5:30 A.M.  Call this number if you have problems the morning of surgery:  [phone]   Remember:  Do not eat or drink after midnight.    Take these medicines the morning of surgery with A SIP OF WATER  budesonide-formoterol (SYMBICORT)  carvedilol (COREG)  DULoxetine (CYMBALTA)  levothyroxine (SYNTHROID, LEVOTHROID) LYRICA  mirabegron ER (MYRBETRIQ)  acetaminophen (TYLENOL) -if needed albuterol (PROVENTIL HFA;VENTOLIN HFA) -if needed *bring with you to the hospital on day of surgery* cyclobenzaprine (FLEXERIL) -if needed traMADol (ULTRAM)-if needed   Follow your surgeon's instructions on when to stop apixaban (ELIQUIS).  If no instructions were given by your surgeon then you will need to call the office to get those instructions.    7 days prior to surgery STOP taking any Aspirin(unless otherwise instructed by your surgeon), Aleve, Naproxen, Ibuprofen, Motrin, Advil, Goody's, BC's, all herbal medications, fish oil, and all vitamins    Do not wear jewelry, make-up or nail polish.  Do not wear lotions, powders, or perfumes, or deodorant.  Do not shave 48 hours prior to surgery.    Do not bring valuables to the hospital.  [facility] is not responsible for any belongings or valuables.  Contacts, dentures or bridgework may not be worn into surgery.  Leave your suitcase in the car.  After surgery it may be brought to your room.  For patients admitted to the hospital, discharge  time will be determined by your treatment team.  Patients discharged the day of surgery will not be allowed to drive home.   [facility]- Preparing For Surgery  Before surgery, you can play an important role. Because skin is not sterile, your skin needs to be as free of germs as possible. You can reduce the number of germs on your skin by washing with CHG (chlorahexidine gluconate) Soap before surgery.  CHG is an antiseptic cleaner which kills germs and bonds with the skin to continue killing germs even after washing.    Oral Hygiene is also important to reduce your risk of infection.  Remember - BRUSH YOUR TEETH THE MORNING OF SURGERY WITH YOUR REGULAR TOOTHPASTE  Please do not use if you have an allergy to CHG or antibacterial soaps. If your skin becomes reddened/irritated stop using the CHG.  Do not shave (including legs and underarms) for at least 48 hours prior to first CHG shower. It is OK to shave your face.  Please follow these instructions carefully.   1. Shower the NIGHT BEFORE SURGERY and the MORNING OF SURGERY with CHG.   2. If you chose to wash your hair, wash your hair first as usual with your normal shampoo.  3. After you shampoo, rinse your hair and body thoroughly to remove the shampoo.  4. Use CHG as you would any other liquid soap. You can apply CHG directly to the skin and wash gently with a scrungie or a clean washcloth.   5. Apply the CHG Soap to your body ONLY FROM THE NECK DOWN.  Do not  use on open wounds or open sores. Avoid contact with your eyes, ears, mouth and genitals (private parts). Wash Face and genitals (private parts)  with your normal soap.  6. Wash thoroughly, paying special attention to the area where your surgery will be performed.  7. Thoroughly rinse your body with warm water from the neck down.  8. DO NOT shower/wash with your normal soap after using and rinsing off the CHG Soap.  9. Pat yourself dry with a CLEAN TOWEL.  10. Wear CLEAN  PAJAMAS to bed the night before surgery, wear comfortable clothes the morning of surgery  11. Place CLEAN SHEETS on your bed the night of your first shower and DO NOT SLEEP WITH PETS.    Day of Surgery: Shower as stated above. Do not apply any deodorants/lotions.  Please wear clean clothes to the hospital/surgery center.   Remember to brush your teeth WITH YOUR REGULAR TOOTHPASTE.   Please read over the following fact sheets that you were given.

## 2018-03-21 ENCOUNTER — Encounter (HOSPITAL_COMMUNITY)
Admission: RE | Admit: 2018-03-21 | Discharge: 2018-03-21 | Disposition: A | Discharge status: Home / Self Care | Payer: Medicare Other | Source: Ambulatory Visit | Attending: Orthopedic Surgery | Admitting: Orthopedic Surgery

## 2018-03-21 ENCOUNTER — Encounter (HOSPITAL_COMMUNITY): Payer: Self-pay

## 2018-03-21 ENCOUNTER — Other Ambulatory Visit: Payer: Self-pay

## 2018-03-21 DIAGNOSIS — Z01812 Encounter for preprocedural laboratory examination: Secondary | ICD-10-CM | POA: Insufficient documentation

## 2018-03-21 LAB — BASIC METABOLIC PANEL
Anion gap: 10 (ref 5–15)
BUN: 15 mg/dL (ref 8–23)
CALCIUM: 6.2 mg/dL — AB (ref 8.9–10.3)
CO2: 27 mmol/L (ref 22–32)
Chloride: 103 mmol/L (ref 98–111)
Creatinine, Ser: 0.9 mg/dL (ref 0.44–1.00)
GFR calc Af Amer: >60 mL/min (ref >60)
GFR calc non Af Amer: >60 mL/min (ref >60)
Glucose, Bld: 84 mg/dL (ref 70–99)
Potassium: 4.8 mmol/L (ref 3.5–5.1)
Sodium: 140 mmol/L (ref 135–145)

## 2018-03-21 LAB — CBC
HCT: 36.5 % (ref 36.0–46.0)
Hemoglobin: 11 g/dL — ABNORMAL LOW (ref 12.0–15.0)
MCH: 28.1 pg (ref 26.0–34.0)
MCHC: 30.1 g/dL (ref 30.0–36.0)
MCV: 93.1 fL (ref 80.0–100.0)
Platelets: 152 10*3/uL (ref 150–400)
RBC: 3.92 MIL/uL (ref 3.87–5.11)
RDW: 16.1 % — ABNORMAL HIGH (ref 11.5–15.5)
WBC: 3.9 10*3/uL — ABNORMAL LOW (ref 4.0–10.5)
nRBC: 0 % (ref 0.0–0.2)

## 2018-03-21 LAB — SURGICAL PCR SCREEN
MRSA, PCR: NEGATIVE
Staphylococcus aureus: NEGATIVE

## 2018-03-21 NOTE — Progress Notes (Addendum)
PCP - Dr. Latanya Presser    Cardiologist -  Dr. [name]  Chest x-ray -  08/2017  EKG - 11/2017  Stress Test - 03/02/2009  ECHO - 01/2018  Cardiac Cath - 01/2011  Sleep Study -  CPAP -   Fasting Blood Sugar -  Checks Blood Sugar _____ times a day  Blood Thinner Instructions: Will be holding Eliquis x 3 days,  Last dose should be Feb. 4th Aspirin Instructions:  Anesthesia review:  Yes - anesthesia complication plus heart history (blood pressure tanked in the OR)  Patient denies shortness of breath, fever, cough and chest pain at PAT appointment  Patient verbalized understanding of instructions that were given to them at the PAT appointment. Patient was also instructed that they will need to review over the PAT instructions again at home before surgery.  Thursday  3:06PM Patient's calcium level is low at 6.2 - I was notified by Lab.   I did call [facility], and spoke with receptionist to have Merla Riches look at patients' labs.Marland KitchenMarland Kitchen

## 2018-03-21 NOTE — Progress Notes (Signed)
Anesthesia Chart Review:  Case:  [id number] Date/Time:  03/29/18 0715   Procedure:  REVERSE SHOULDER ARTHROPLASTY (Right )   Anesthesia type:  General   Pre-op diagnosis:  Right shoulder rotator cuff arthropathy   Location:  [facility] ROOM 07 / [facility]   Surgeon:  Netta Cedars, MD      DISCUSSION: 68 yo female never smoker. 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  Pt was previously scheduled for the above procedure 01/11/2018, however the procedure was aborted due to persistent hypotension that occurred after induction of anesthesia.  She was seen by Dr. [name] on 01/15/2018 and per his note he suspected that her hypotension was due to hypovolemia.  "It may be related to the fact that she is continued on Lasix despite improvement in LVEF up to 60 to 65%.  She has no detectable congestive heart failure symptoms now and weight is up a couple pounds however think this is not edema.  I would recommend that we repeat an echocardiogram to make sure LV function has been stable since her last study 6 months ago.  I within 1 to see if her IVC is smaller collapses suggesting following completion.  If this is the case we can reduce her diuretic to either as needed [facility] certainly hold 3 days prior to repeat attempted surgery.  She will probably need to be well-hydrated for surgery to avoid any of these future complications.  Regional block may be safer to avoid this however comes with its onset of possible complications."   Follow up echo 02/01/18 demonstrated an EF of 55-60, mild to mod MR, mild LAE, mod reduced RVSF, mod RVE, PASP 62.  Pt was cleared for surgery by Dr. [name] on 02/26/2018.  Pt was hospitalized  5/21-5/28/2019 for Acute on chronic hypoxic and hypercarbic respiratory failure, Acute on chronic diastolic CHF, and mild AKI. During hospitalization pulmonary recommended home BiPAP. Pt follows with pulmonology at [facility], Dr. Francena Hanly last seen 08/01/2017. 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 She has pulmonary clearance from Dr. Camillo Flaming dated 01/04/2018 stating pt is at low risk from a pulmonary standpoint.  On preadmission labs patient was noted to have significant hypocalcemia with calcium 6.2.  This result was called to Dr. Veverly Fells' office as well as staff messaged to Dr. Renne Crigler. Review of previous labs show she had a similar hypocalcemia of 5.7 on 08/03/2017 that was discovered on preoperative testing prior to a cardioversion.  She was seen by endocrinology Dr. Cruzita Lederer and was ultimately recommended to start calcitriol 0.5 mcg twice a day as well as HCTZ 25 mg daily along with 20 mg of Lasix daily.  It appears that her diuretics were subsequently reduced due to the hypovolemia and hypotension that she experienced on induction of anesthesia.  This balance between hypocalcemia and hypovolemia appears to be tricky and we will require input from both endocrinology and cardiology prior to surgery to hopefully achieve the correct balance, Dr. Veverly Fells' office has been made aware  I received a reply from Dr. Renne Crigler stating she believed her hypocalcemia was likely due to hypervolemia and she would discuss with the pt about restarting HCTZ at a lower dose 12.5mg , however would need input from  nephrology.  Pt was seen at [facility] ED on 03/25/18 with cc of hypocalcemia. Repeat labs at that time showed calcium 7.1, up from 6.2 on 03/21/18. She was asymptomatic, received IV infusion of calcium gluconate, and was discharged.  I discussed the case at length with Dr. Ambrose Pancoast. He plans to use art line and will assess pt on DOS.  VS: BP 101/67   Pulse 88   Temp  36.5 C   Resp 20   Ht 5\' 5"  (1.651 m)   Wt (!) 137.9 kg   SpO2 100%   BMI 50.59 kg/m   PROVIDERS: [name], MD is PCP  Layla Maw, MD is Endocrinologist  Lyman Bishop, MD is Cardiologist  LABS: Severe hypocalcemia. See above for discussion. Labs will be rechecked DOS. (all labs ordered are listed, but only abnormal results are displayed)  Labs Reviewed  CBC - Abnormal; Notable for the following components:      Result Value   WBC 3.9 (*)    Hemoglobin 11.0 (*)    RDW 16.1 (*)    All other components within normal limits  BASIC METABOLIC PANEL - Abnormal; Notable for the following components:   Calcium 6.2 (*)    All other components within normal limits  SURGICAL PCR SCREEN     IMAGES: CTA PE 09/21/2017: IMPRESSION: 1. No pulmonary emboli [place] acute abnormality. 2. Stable enlarged central pulmonary arteries suggesting pulmonary arterial hypertension. 3. 6 mm upper pole right renal calculus.  EKG: 12/14/2017: Afib. Vent rate 93. Nonspecific ST-T wave changes.  CV: TTE 02/01/2018: Study Conclusions  - Left ventricle: The cavity size was normal. Systolic function was   normal. The estimated ejection fraction was in the range of 55%   to 60%. - Ventricular septum: The contour showed diastolic flattening and   systolic flattening. - Mitral valve: There was mild to moderate regurgitation. - Left atrium: The atrium was mildly dilated. - Right ventricle: The cavity size was moderately dilated. Systolic   function was moderately reduced. - Right atrium: The atrium was moderately dilated. - Tricuspid valve: There was moderate regurgitation. - Pulmonary arteries: Systolic pressure was moderately increased.   PA peak pressure: 62 mm Hg (S).  Impressions:  - There has been no significant change since the prior study on   07/13/2017.  Past Medical History:  Diagnosis Date  . Anemia   . Asthma    flare up last yr  . CHF (congestive heart  failure) ([place])   . Chronic headache   . DOE (dyspnea on exertion)    2D ECHO, 02/12/2012 - EF 60-65%, moderate concentric hypertrophy  . Fibromyalgia    nerve pain"left side at waist level" "can't lay on that side without pain" , "HOB elevation helps"  . Heart murmur   . Hematuria - cause not known   . History of kidney stones    x 2 '13, '14 surgery to remove  . Hypertension   . SBO (small bowel obstruction) ([place]) 06/07/2013  . Sleep apnea    tested several times and came up negative  . Thyroid disease    "goiter"  . Transfusion history    10 yrs+    Past Surgical History:  Procedure Laterality Date  . CARDIAC CATHETERIZATION  04/04/2010   No significant obstructive coronary artery disease  . CARDIOVERSION N/A 08/08/2017   Procedure: CARDIOVERSION;  Surgeon: Pixie Casino, MD;  Location: [facility];  Service: Cardiovascular;  Laterality: N/A;  . CHOLECYSTECTOMY  1990  .  COLONOSCOPY W/ POLYPECTOMY    . COLONOSCOPY WITH PROPOFOL N/A 04/10/2014   Procedure: COLONOSCOPY WITH PROPOFOL;  Surgeon: Beryle Beams, MD;  Location: [facility];  Service: Endoscopy;  Laterality: N/A;  . DIAGNOSTIC LAPAROSCOPY     x2 bowel obstructions(adhesions)  . EYE SURGERY     lasik 20-25 yrs ago  . 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 THYROIDECTOMY    . TUBAL LIGATION  1986    MEDICATIONS: . acetaminophen (TYLENOL) 650 MG CR tablet  . albuterol (PROVENTIL HFA;VENTOLIN HFA) 108 (90 Base) MCG/ACT inhaler  . apixaban (ELIQUIS) 5 MG TABS tablet  . budesonide-formoterol (SYMBICORT) 160-4.5 MCG/ACT inhaler  . calcitRIOL (ROCALTROL) 0.5 MCG capsule  . calcium carbonate (OS-CAL - DOSED IN MG OF ELEMENTAL CALCIUM) 1250 (500 Ca) MG tablet  . carvedilol (COREG) 3.125 MG tablet  . cyanocobalamin (,VITAMIN B-12,) 1000 MCG/ML injection  . cyclobenzaprine (FLEXERIL) 10 MG tablet  . diclofenac sodium (VOLTAREN) 1 % GEL  . DULoxetine (CYMBALTA) 60 MG capsule   . eszopiclone (LUNESTA) 2 MG TABS tablet  . furosemide (LASIX) 20 MG tablet  . levothyroxine (SYNTHROID, LEVOTHROID) 200 MCG tablet  . LYRICA 200 MG capsule  . magnesium oxide (MAG-OX) 400 MG tablet  . mirabegron ER (MYRBETRIQ) 50 MG TB24 tablet  . senna (SENOKOT) 8.6 MG TABS tablet  . traMADol (ULTRAM) 50 MG tablet  . traZODone (DESYREL) 50 MG tablet  . Vitamin D, Ergocalciferol, (DRISDOL) 50000 units CAPS capsule   No current facility-administered medications for this encounter.    Wynonia Musty [facility] Short Stay Center/Anesthesiology Phone [phone] 03/27/2018 12:03 PM

## 2018-03-21 NOTE — Pre-Procedure Instructions (Signed)
Abrey Gaynelle Carpenter  03/21/2018      [id number] [place], [place] [address] [place] [address] Phone: [phone] Fax: [phone]  [facility] [place] [place] [facility] [address] [place] [address] Phone: [phone] Fax: [phone]    Your procedure is scheduled on Friday February 7th.   Report to [facility] Admitting at 5:30 A.M.             (posted surgery time 7:30a - 9:39a)   Call this number if you have problems the morning of surgery:  [phone]   Remember:  Do not eat any foods or drink any liquids after midnight, Thursday.    Take these medicines the morning of surgery with A SIP OF WATER  budesonide-formoterol (SYMBICORT)  carvedilol (COREG)  DULoxetine (CYMBALTA)  levothyroxine (SYNTHROID, LEVOTHROID) LYRICA  mirabegron ER (MYRBETRIQ)  acetaminophen (TYLENOL) -if needed albuterol (PROVENTIL HFA;VENTOLIN HFA) -if needed *bring with you to the hospital on day of surgery* cyclobenzaprine (FLEXERIL) -if needed traMADol (ULTRAM)-if needed   Follow your surgeon's instructions on when to stop apixaban (ELIQUIS).  If no instructions were given by your surgeon then you will need to call the office to get those instructions.    7 days prior to surgery STOP taking any Aspirin(unless otherwise instructed by your surgeon), Aleve, Naproxen, Ibuprofen, Motrin, Advil, Goody's, BC's, all herbal medications, fish oil, and all vitamins    Do not wear jewelry, make-up or nail polish.  Do not wear lotions, powders,  perfumes, or deodorant.  Do not shave 48 hours prior to surgery.    Do not bring valuables to the hospital.  [facility] is not responsible for any belongings or valuables.  Contacts, dentures or bridgework may not be worn into surgery.  Leave your suitcase in the car.  After surgery  it may be brought to your room.  For patients admitted to the hospital, discharge time will be determined by your treatment team.      [facility]- Preparing For Surgery  Before surgery, you can play an important role. Because skin is not sterile, your skin needs to be as free of germs as possible. You can reduce the number of germs on your skin by washing with CHG (chlorahexidine gluconate) Soap before surgery.  CHG is an antiseptic cleaner which kills germs and bonds with the skin to continue killing germs even after washing.    Oral Hygiene is also important to reduce your risk of infection.    Remember - BRUSH YOUR TEETH THE MORNING OF SURGERY WITH YOUR REGULAR TOOTHPASTE  Please do not use if you have an allergy to CHG or antibacterial soaps. If your skin becomes reddened/irritated stop using the CHG.  Do not shave (including legs and underarms) for at least 48 hours prior to first CHG shower. It is OK to shave your face.  Please follow these instructions carefully.   1. Shower the NIGHT BEFORE SURGERY and the MORNING OF SURGERY with CHG.   2. If you chose to wash your hair, wash your hair first as usual with your normal shampoo.  3. After you shampoo, rinse your hair and body thoroughly to remove the shampoo.  4. Use CHG as you would any other liquid soap. You can apply CHG directly to the skin and wash gently with a scrungie or a clean washcloth.  5. Apply the CHG Soap to your body ONLY FROM THE NECK DOWN.  Do not use on open wounds or open sores. Avoid contact with your eyes, ears, mouth and genitals (private parts). Wash Face and genitals (private parts)  with your normal soap.  6. Wash thoroughly, paying special attention to the area where your surgery will be performed.  7. Thoroughly rinse your body with warm water from the neck down.  8. DO NOT shower/wash with your normal soap after using and rinsing off the CHG Soap.  9. Pat yourself dry with a CLEAN  TOWEL.  10. Wear CLEAN PAJAMAS to bed the night before surgery, wear comfortable clothes the morning of surgery  11. Place CLEAN SHEETS on your bed the night of your first shower and DO NOT SLEEP WITH PETS.  Day of Surgery: Shower as stated above. Do not apply any deodorants/lotions.  Please wear clean clothes to the hospital/surgery center.    Remember to brush your teeth WITH YOUR REGULAR TOOTHPASTE.   Please read over the following fact sheets that you were given.

## 2018-03-22 ENCOUNTER — Encounter: Payer: Self-pay | Admitting: Internal Medicine

## 2018-03-22 DIAGNOSIS — E875 Hyperkalemia: Secondary | ICD-10-CM | POA: Diagnosis not present

## 2018-03-25 ENCOUNTER — Encounter (HOSPITAL_COMMUNITY): Payer: Self-pay | Admitting: *Deleted

## 2018-03-25 ENCOUNTER — Emergency Department (HOSPITAL_COMMUNITY)
Admission: EM | Admit: 2018-03-25 | Discharge: 2018-03-26 | Disposition: A | Discharge status: Home / Self Care | Payer: Medicare Other | Source: Home / Self Care | Attending: Emergency Medicine | Admitting: Emergency Medicine

## 2018-03-25 DIAGNOSIS — I5032 Chronic diastolic (congestive) heart failure: Secondary | ICD-10-CM

## 2018-03-25 DIAGNOSIS — D62 Acute posthemorrhagic anemia: Secondary | ICD-10-CM | POA: Diagnosis not present

## 2018-03-25 DIAGNOSIS — E039 Hypothyroidism, unspecified: Secondary | ICD-10-CM

## 2018-03-25 DIAGNOSIS — Z6841 Body Mass Index (BMI) 40.0 and over, adult: Secondary | ICD-10-CM | POA: Diagnosis not present

## 2018-03-25 DIAGNOSIS — I110 Hypertensive heart disease with heart failure: Secondary | ICD-10-CM | POA: Diagnosis not present

## 2018-03-25 DIAGNOSIS — I509 Heart failure, unspecified: Secondary | ICD-10-CM | POA: Diagnosis not present

## 2018-03-25 DIAGNOSIS — M25511 Pain in right shoulder: Secondary | ICD-10-CM | POA: Diagnosis not present

## 2018-03-25 DIAGNOSIS — M75101 Unspecified rotator cuff tear or rupture of right shoulder, not specified as traumatic: Secondary | ICD-10-CM | POA: Diagnosis not present

## 2018-03-25 DIAGNOSIS — M19011 Primary osteoarthritis, right shoulder: Secondary | ICD-10-CM | POA: Diagnosis not present

## 2018-03-25 DIAGNOSIS — R5383 Other fatigue: Secondary | ICD-10-CM | POA: Diagnosis not present

## 2018-03-25 DIAGNOSIS — Z79899 Other long term (current) drug therapy: Secondary | ICD-10-CM

## 2018-03-25 LAB — CBC WITH DIFFERENTIAL/PLATELET
Abs Immature Granulocytes: 0.01 10*3/uL (ref 0.00–0.07)
Basophils Absolute: 0 10*3/uL (ref 0.0–0.1)
Basophils Relative: 1 %
Eosinophils Absolute: 0.2 10*3/uL (ref 0.0–0.5)
Eosinophils Relative: 3 %
HCT: 35 % — ABNORMAL LOW (ref 36.0–46.0)
Hemoglobin: 10.6 g/dL — ABNORMAL LOW (ref 12.0–15.0)
Immature Granulocytes: 0 %
LYMPHS PCT: 22 %
Lymphs Abs: 1.1 10*3/uL (ref 0.7–4.0)
MCH: 28.5 pg (ref 26.0–34.0)
MCHC: 30.3 g/dL (ref 30.0–36.0)
MCV: 94.1 fL (ref 80.0–100.0)
Monocytes Absolute: 0.7 10*3/uL (ref 0.1–1.0)
Monocytes Relative: 14 %
Neutro Abs: 3 10*3/uL (ref 1.7–7.7)
Neutrophils Relative %: 60 %
Platelets: 164 10*3/uL (ref 150–400)
RBC: 3.72 MIL/uL — AB (ref 3.87–5.11)
RDW: 15.9 % — ABNORMAL HIGH (ref 11.5–15.5)
WBC: 5 10*3/uL (ref 4.0–10.5)
nRBC: 0 % (ref 0.0–0.2)

## 2018-03-25 LAB — BASIC METABOLIC PANEL
Anion gap: 10 (ref 5–15)
BUN: 24 mg/dL — ABNORMAL HIGH (ref 8–23)
CO2: 28 mmol/L (ref 22–32)
CREATININE: 0.93 mg/dL (ref 0.44–1.00)
Calcium: 7.1 mg/dL — ABNORMAL LOW (ref 8.9–10.3)
Chloride: 105 mmol/L (ref 98–111)
GFR calc non Af Amer: >60 mL/min (ref >60)
Glucose, Bld: 85 mg/dL (ref 70–99)
Potassium: 4.6 mmol/L (ref 3.5–5.1)
Sodium: 143 mmol/L (ref 135–145)

## 2018-03-25 MED ORDER — CALCIUM GLUCONATE-NACL 1-0.675 GM/50ML-% IV SOLN
1.0000 g | Freq: Once | INTRAVENOUS | Status: AC
  Administered 2018-03-25: 1000 mg via INTRAVENOUS
  Filled 2018-03-25: qty 50

## 2018-03-25 MED ORDER — SODIUM CHLORIDE 0.9 % IV SOLN: 1.0000 g | Freq: Once | INTRAVENOUS | Status: DC

## 2018-03-25 NOTE — ED Triage Notes (Signed)
Pt called today and told to come to ED due to abnormal lab work, calcium was low on a result from Friday, no distress noted

## 2018-03-25 NOTE — ED Provider Notes (Signed)
[facility] EMERGENCY DEPARTMENT Provider Note   CSN: [id number] Arrival date & time: 03/25/18  1159     History   Chief Complaint Chief Complaint  Patient presents with  . Abnormal Lab    HPI Jillian Eaton is a 68 y.o. female.  Patient is a 68 year old female with a history of fibromyalgia, CHF, asthma, hypertension, atrial fibrillation, thyroid disease who presents with low calcium.  She had her calcium checked 4 days ago and it was found to be low.  She has a little fatigue but otherwise is asymptomatic.  No muscle spasms.  No other weakness.  She had similar symptoms about 6 months ago where her calcium dropped to 5.7.  She was started on Calcitrol which she is currently still taking.  She is followed by an endocrinologist.     Past Medical History:  Diagnosis Date  . Anemia   . Asthma    flare up last yr  . CHF (congestive heart failure) ([place])   . Chronic headache   . DOE (dyspnea on exertion)    2D ECHO, 02/12/2012 - EF 60-65%, moderate concentric hypertrophy  . Fibromyalgia    nerve pain"left side at waist level" "can't lay on that side without pain" , "HOB elevation helps"  . Heart murmur   . Hematuria - cause not known   . History of kidney stones    x 2 '13, '14 surgery to remove  . Hypertension   . SBO (small bowel obstruction) ([place]) 06/07/2013  . Sleep apnea    tested several times and came up negative  . Thyroid disease    "goiter"  . Transfusion history    10 yrs+    Patient Active Problem List   Diagnosis Date Noted  . NICM (nonischemic cardiomyopathy) ([place]) 01/15/2018  . Osteoporosis 12/13/2017  . Chronic diastolic CHF (congestive heart failure) ([place]) 09/28/2017  . 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 Hypercapnemia 07/13/2017  . AKI (acute kidney injury) ([place]) 07/10/2017  . On anticoagulant therapy 06/19/2017  . GERD (gastroesophageal reflux disease) 08/28/2016  . Postoperative hypothyroidism  08/28/2016  . Depression 08/28/2016  . Iatrogenic hypocalcemia 08/28/2016  . Chronic pain syndrome   . Fibromyalgia   . Asthma   . Acute diastolic (congestive) heart failure ([place])   . Atrial fibrillation ([place]) 04/28/2016  . Vocal cord dysfunction 04/28/2016  . Thrombocytopenia ([place]) 04/28/2016  . Seizures ([place])   . Preoperative cardiovascular examination   . Leg swelling 10/19/2015  . Acute on chronic respiratory failure with hypoxia and hypercapnia ([place]) 08/03/2015  . Bilateral leg edema 05/11/2015  . Essential hypertension 05/11/2015  . Pulmonary hypertension ([place]) 01/08/2013  . Anemia of chronic disease 03/28/2011  . Morbid obesity ([place]) 09/22/2010    Past Surgical History:  Procedure Laterality Date  . CARDIAC CATHETERIZATION  04/04/2010   No significant obstructive coronary artery disease  . CARDIOVERSION N/A 08/08/2017   Procedure: CARDIOVERSION;  Surgeon: Pixie Casino, MD;  Location: [facility];  Service: Cardiovascular;  Laterality: N/A;  . CHOLECYSTECTOMY  1990  . COLONOSCOPY W/ POLYPECTOMY    . COLONOSCOPY WITH PROPOFOL N/A 04/10/2014   Procedure: COLONOSCOPY WITH PROPOFOL;  Surgeon: Beryle Beams, MD;  [facility];  Service: Endoscopy;  Laterality: N/A;  . DIAGNOSTIC LAPAROSCOPY     x2 bowel obstructions(adhesions)  . EYE SURGERY     lasik 20-25 yrs ago  . 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 THYROIDECTOMY    . TUBAL LIGATION  1986     OB History   No obstetric history on file.      Home Medications    Prior to Admission medications   Medication Sig Start Date End Date Taking? Authorizing Provider  acetaminophen (TYLENOL) 650 MG CR tablet Take 1,300 mg by mouth 2 (two) times daily.    Yes [provider]  apixaban (ELIQUIS) 5 MG TABS tablet Take 1 tablet (5 mg total) by mouth 2 (two) times daily. 12/13/17  Yes Hilty, Nadean Corwin, MD  calcitRIOL (ROCALTROL) 0.5 MCG capsule TAKE 1 CAPSULE BY  MOUTH 2 TIMES DAILY. Patient taking differently: Take 0.5 mcg by mouth 2 (two) times daily.  01/15/18  Yes Philemon Kingdom, MD  calcium carbonate (OS-CAL - DOSED IN MG OF ELEMENTAL CALCIUM) 1250 (500 Ca) MG tablet Take 1 tablet (1,250 mg total) by mouth 3 (three) times daily with meals. Patient taking differently: Take 2 tablets by mouth 2 (two) times daily.  07/17/17  Yes Regalado, Belkys A, MD  carvedilol (COREG) 3.125 MG tablet Take 1 tablet (3.125 mg total) by mouth 2 (two) times daily with a meal. 07/17/17  Yes Regalado, Belkys A, MD  cyanocobalamin (,VITAMIN B-12,) 1000 MCG/ML injection Inject 1,000 mcg into the muscle every 30 (thirty) days.   Yes [provider]  cyclobenzaprine (FLEXERIL) 10 MG tablet Take 20 mg by mouth 2 (two) times daily.  12/12/17  Yes [provider]  diclofenac sodium (VOLTAREN) 1 % GEL Apply 1 application topically 2 (two) times daily as needed (for knee pain).   Yes [provider]  DULoxetine (CYMBALTA) 60 MG capsule Take 1 capsule (60 mg total) by mouth daily. 07/17/17  Yes Regalado, Belkys A, MD  eszopiclone (LUNESTA) 2 MG TABS tablet Take 2 mg by mouth at bedtime. 07/03/17  Yes [provider]  furosemide (LASIX) 20 MG tablet Take 20 mg by mouth daily.   Yes [provider]  levothyroxine (SYNTHROID, LEVOTHROID) 200 MCG tablet Take 200 mcg by mouth daily before breakfast.   Yes [provider]  LYRICA 200 MG capsule Take 200 mg by mouth 2 (two) times daily.  08/09/16  Yes [provider]  magnesium oxide (MAG-OX) 400 MG tablet Take 400 mg by mouth daily. 12/25/17  Yes [provider]  mirabegron ER (MYRBETRIQ) 50 MG TB24 tablet Take 50 mg by mouth daily.   Yes [provider]  senna (SENOKOT) 8.6 MG TABS tablet Take 1 tablet (8.6 mg total) by mouth daily. 07/17/17  Yes Regalado, Belkys A, MD  traMADol (ULTRAM) 50 MG tablet Take 50 mg by mouth every 6 (six) hours as needed for pain.  12/25/17  Yes [provider]  traZODone (DESYREL) 50 MG tablet Take 50 mg by mouth at bedtime.  09/05/17  Yes [provider]  Vitamin D, Ergocalciferol, (DRISDOL) 50000 units CAPS capsule Take 50,000 Units by mouth every Wednesday.    Yes [provider]    Family History Family History  Problem Relation Age of Onset  . Diabetes Mother   . Epilepsy Mother   . Cancer Mother        Breast  . Hypertension Mother   . Breast cancer Mother   . Kidney disease Father   . Diabetes Father   . Hypertension Father   . Asthma Father   . Heart disease Father   . Epilepsy Sister   . Cancer Maternal Grandmother   .  Breast cancer Maternal Grandmother   . Cancer Paternal Grandmother   . Breast cancer Paternal Grandmother     Social History Social History   Tobacco Use  . Smoking status: Never Smoker  . Smokeless tobacco: Never Used  Substance Use Topics  . Alcohol use: No    Comment: wine occ  . Drug use: No    Types: Oxycodone    Comment: perscribed     Allergies   Norco [hydrocodone-acetaminophen]   Review of Systems Review of Systems  Constitutional: Positive for fatigue. Negative for chills, diaphoresis and fever.  HENT: Negative for congestion, rhinorrhea and sneezing.   Eyes: Negative.   Respiratory: Negative for cough, chest tightness and shortness of breath.   Cardiovascular: Negative for chest pain and leg swelling.  Gastrointestinal: Negative for abdominal pain, blood in stool, diarrhea, nausea and vomiting.  Genitourinary: Negative for difficulty urinating, flank pain, frequency and hematuria.  Musculoskeletal: Negative for arthralgias and back pain.  Skin: Negative for rash.  Neurological: Negative for dizziness, speech difficulty, weakness, numbness and headaches.     Physical Exam Updated Vital Signs BP 109/77   Pulse 76   Temp 98.1 F (36.7 C) (Oral)   Resp 15   SpO2 91%   Physical Exam Constitutional:      Appearance: She  is well-developed.  HENT:     Head: Normocephalic and atraumatic.  Eyes:     Pupils: Pupils are equal, round, and reactive to light.  Neck:     Musculoskeletal: Normal range of motion and neck supple.  Cardiovascular:     Rate and Rhythm: Normal rate. Rhythm irregular.     Heart sounds: Normal heart sounds.  Pulmonary:     Effort: Pulmonary effort is normal. No respiratory distress.     Breath sounds: Normal breath sounds. No wheezing or rales.  Chest:     Chest wall: No tenderness.  Abdominal:     General: Bowel sounds are normal.     Palpations: Abdomen is soft.     Tenderness: There is no abdominal tenderness. There is no guarding or rebound.  Musculoskeletal: Normal range of motion.  Lymphadenopathy:     Cervical: No cervical adenopathy.  Skin:    General: Skin is warm and dry.     Findings: No rash.  Neurological:     Mental Status: She is alert and oriented to person, place, and time.      ED Treatments / Results  Labs (all labs ordered are listed, but only abnormal results are displayed) Labs Reviewed  BASIC METABOLIC PANEL - Abnormal; Notable for the following components:      Result Value   BUN 24 (*)    Calcium 7.1 (*)    All other components within normal limits  CBC WITH DIFFERENTIAL/PLATELET - Abnormal; Notable for the following components:   RBC 3.72 (*)    Hemoglobin 10.6 (*)    HCT 35.0 (*)    RDW 15.9 (*)    All other components within normal limits  MAGNESIUM  CALCIUM, IONIZED    EKG None  Radiology No results found.  Procedures Procedures (including critical care time)  Medications Ordered in ED Medications  calcium gluconate 1 g/ 50 mL sodium chloride IVPB (has no administration in time range)     Initial Impression / Assessment and Plan / ED Course  I have reviewed the triage vital signs and the nursing notes.  Pertinent labs & imaging results that were available during my care of the  patient were reviewed by me and considered  in my medical decision making (see chart for details).     Patient is a 68 year old female who has a history of hypocalcemia.  She presents with an abnormal lab value that was obtained 4 days ago.  Her calcium has improved since then.  At 7.1 today.  She is asymptomatic other than some mild fatigue.  We will give her a dose of calcium gluconate and then she can be discharged home after that.  She will continue her Calcitrol.  She will follow-up with her endocrinologist.  Final Clinical Impressions(s) / ED Diagnoses   Final diagnoses:  Hypocalcemia    ED Discharge Orders    None       Malvin Johns, MD 03/25/18 2258

## 2018-03-25 NOTE — ED Notes (Addendum)
Per MD Belfi, calcium gluconate is to run at 120 mL/hr in order to receive the calcium dose in 20 minutes

## 2018-03-25 NOTE — ED Notes (Signed)
Attempted IV access. Pt is a difficult stick. IV team consulted 

## 2018-03-27 LAB — CALCIUM, IONIZED: Calcium, Ionized, Serum: 3.7 mg/dL — ABNORMAL LOW (ref 4.5–5.6)

## 2018-03-27 NOTE — Anesthesia Preprocedure Evaluation (Deleted)
Anesthesia Evaluation    Airway        Dental   Pulmonary           Cardiovascular hypertension,      Neuro/Psych    GI/Hepatic   Endo/Other    Renal/GU      Musculoskeletal   Abdominal   Peds  Hematology   Anesthesia Other Findings   Reproductive/Obstetrics                                                              Anesthesia Evaluation  Patient identified by MRN, date of birth, ID band Patient awake    Reviewed: Allergy & Precautions, NPO status , Patient's Chart, lab work & pertinent test results   Airway Mallampati: II  TM Distance: >3 FB Neck ROM: Full    Dental no notable dental hx.    Pulmonary neg pulmonary ROS, asthma , sleep apnea ,    Pulmonary exam normal breath sounds clear to auscultation       Cardiovascular hypertension, Pt. on medications + DOE  Normal cardiovascular exam+ Valvular Problems/Murmurs  Rhythm:Regular Rate:Normal     Neuro/Psych  Headaches, Seizures -,  Depression  Neuromuscular disease negative psych ROS   GI/Hepatic Neg liver ROS, GERD  ,  Endo/Other  Hypothyroidism Morbid obesity  Renal/GU negative Renal ROS  negative genitourinary   Musculoskeletal  (+) Fibromyalgia -  Abdominal (+) + obese,   Peds negative pediatric ROS (+)  Hematology negative hematology ROS (+)   Anesthesia Other Findings   Reproductive/Obstetrics negative OB ROS                                                             Anesthesia Evaluation  Patient identified by MRN, date of birth, ID band Patient awake    Reviewed: Allergy & Precautions, H&P , NPO status , Patient's Chart, lab work & pertinent test results, reviewed documented beta blocker date and time   Airway Mallampati: II  TM Distance: >3 FB Neck ROM: full    Dental no notable dental hx.    Pulmonary neg pulmonary ROS, shortness of breath, at  rest and Long-Term Oxygen Therapy, asthma ,    Pulmonary exam normal breath sounds clear to auscultation       Cardiovascular hypertension, On Medications negative cardio ROS   Rhythm:regular Rate:Normal     Neuro/Psych negative neurological ROS  negative psych ROS   GI/Hepatic negative GI ROS, Neg liver ROS, GERD  ,  Endo/Other  negative endocrine ROSHypothyroidism Morbid obesity  Renal/GU negative Renal ROS  negative genitourinary   Musculoskeletal negative musculoskeletal ROS (+)   Abdominal   Peds negative pediatric ROS (+)  Hematology negative hematology ROS (+)   Anesthesia Other Findings 1.   Super morbid obesity, with failure of 3 gastric bypass procedures 2.   Pulmonary hypertension - PA pressure of 51 mmHg 3.   Stable dyspnea on exertion 4.   Hypertension-controlled  Reproductive/Obstetrics negative OB ROS  Anesthesia Physical  Anesthesia Plan  ASA: III  Anesthesia Plan: General   Post-op Pain Management:    Induction: Intravenous  PONV Risk Score and Plan: 3 and Treatment may vary due to age or medical condition  Airway Management Planned: Natural Airway and Mask  Additional Equipment:   Intra-op Plan:   Post-operative Plan:   Informed Consent: I have reviewed the patients History and Physical, chart, labs and discussed the procedure including the risks, benefits and alternatives for the proposed anesthesia with the patient or authorized representative who has indicated his/her understanding and acceptance.     Plan Discussed with: CRNA and Surgeon  Anesthesia Plan Comments:         Anesthesia Quick Evaluation  Anesthesia Physical Anesthesia Plan  ASA: III  Anesthesia Plan: General   Post-op Pain Management:    Induction: Intravenous  PONV Risk Score and Plan: 3 and Ondansetron, Dexamethasone and Midazolam  Airway Management Planned: Oral ETT  Additional  Equipment:   Intra-op Plan:   Post-operative Plan: Extubation in OR  Informed Consent: I have reviewed the patients History and Physical, chart, labs and discussed the procedure including the risks, benefits and alternatives for the proposed anesthesia with the patient or authorized representative who has indicated his/her understanding and acceptance.   Dental advisory given  Plan Discussed with: CRNA  Anesthesia Plan Comments: (See PAT note 01/03/2018 by Karoline Caldwell, PA-C )       Anesthesia Quick Evaluation  Anesthesia Physical Anesthesia Plan  ASA:   Anesthesia Plan:    Post-op Pain Management:    Induction:   PONV Risk Score and Plan:   Airway Management Planned:   Additional Equipment:   Intra-op Plan:   Post-operative Plan:   Informed Consent:   Plan Discussed with:   Anesthesia Plan Comments: (See PAT note by Karoline Caldwell, PA-C )        Anesthesia Quick Evaluation

## 2018-03-28 MED ORDER — DEXTROSE 5 % IV SOLN
3.0000 g | INTRAVENOUS | Status: AC
  Administered 2018-03-29: 3 g via INTRAVENOUS
  Filled 2018-03-28: qty 3

## 2018-03-28 NOTE — Anesthesia Preprocedure Evaluation (Deleted)
Anesthesia Evaluation    Airway        Dental   Pulmonary           Cardiovascular hypertension,   EKG: 12/14/2017: Afib. Vent rate 93. Nonspecific ST-T wave changes.  CV: TTE 02/01/2018: Study Conclusions Left ventricle: The cavity size was normal. Systolic function was normal. The estimated ejection fraction was in the range of 55% to 60%. - Ventricular septum: The contour showed diastolic flattening and systolic flattening. - Mitral valve: There was mild to moderate regurgitation. - Left atrium: The atrium was mildly dilated. - Right ventricle: The cavity size was moderately dilated. Systolic function was moderately reduced. - Right atrium: The atrium was moderately dilated. - Tricuspid valve: There was moderate regurgitation. - Pulmonary arteries: Systolic pressure was moderately increased. PA peak pressure: 62 mm Hg (S).   Neuro/Psych    GI/Hepatic   Endo/Other    Renal/GU      Musculoskeletal   Abdominal   Peds  Hematology   Anesthesia Other Findings   Reproductive/Obstetrics                             Anesthesia Physical Anesthesia Plan  ASA: III  Anesthesia Plan: General   Post-op Pain Management:    Induction: Intravenous  PONV Risk Score and Plan:   Airway Management Planned: Oral ETT  Additional Equipment:   Intra-op Plan:   Post-operative Plan: Extubation in OR  Informed Consent:   Plan Discussed with:   Anesthesia Plan Comments: (  )        Anesthesia Quick Evaluation                                  Anesthesia Evaluation  Patient identified by MRN, date of birth, ID band Patient awake    Reviewed: Allergy & Precautions, NPO status , Patient's Chart, lab work & pertinent test results  Airway Mallampati: II  TM Distance: >3 FB Neck ROM: Full    Dental no notable dental hx.    Pulmonary neg pulmonary ROS, asthma ,  sleep apnea ,    Pulmonary exam normal breath sounds clear to auscultation       Cardiovascular hypertension, Pt. on medications + DOE  Normal cardiovascular exam+ Valvular Problems/Murmurs  Rhythm:Regular Rate:Normal     Neuro/Psych  Headaches, Seizures -,  Depression  Neuromuscular disease negative psych ROS   GI/Hepatic Neg liver ROS, GERD  ,  Endo/Other  Hypothyroidism Morbid obesity  Renal/GU negative Renal ROS  negative genitourinary   Musculoskeletal  (+) Fibromyalgia -  Abdominal (+) + obese,   Peds negative pediatric ROS (+)  Hematology negative hematology ROS (+)   Anesthesia Other Findings   Reproductive/Obstetrics negative OB ROS                                                             Anesthesia Evaluation  Patient identified by MRN, date of birth, ID band Patient awake    Reviewed: Allergy & Precautions, H&P , NPO status , Patient's Chart, lab work & pertinent test results, reviewed documented beta blocker date and time   Airway Mallampati: II  TM Distance: >3 FB Neck ROM:  full    Dental no notable dental hx.    Pulmonary neg pulmonary ROS, shortness of breath, at rest and Long-Term Oxygen Therapy, asthma ,    Pulmonary exam normal breath sounds clear to auscultation       Cardiovascular hypertension, On Medications negative cardio ROS   Rhythm:regular Rate:Normal     Neuro/Psych negative neurological ROS  negative psych ROS   GI/Hepatic negative GI ROS, Neg liver ROS, GERD  ,  Endo/Other  negative endocrine ROSHypothyroidism Morbid obesity  Renal/GU negative Renal ROS  negative genitourinary   Musculoskeletal negative musculoskeletal ROS (+)   Abdominal   Peds negative pediatric ROS (+)  Hematology negative hematology ROS (+)   Anesthesia Other Findings 1.   Super morbid obesity, with failure of 3 gastric bypass procedures 2.   Pulmonary hypertension - PA pressure of 51  mmHg 3.   Stable dyspnea on exertion 4.   Hypertension-controlled  Reproductive/Obstetrics negative OB ROS                             Anesthesia Physical  Anesthesia Plan  ASA: III  Anesthesia Plan: General   Post-op Pain Management:    Induction: Intravenous  PONV Risk Score and Plan: 3 and Treatment may vary due to age or medical condition  Airway Management Planned: Natural Airway and Mask  Additional Equipment:   Intra-op Plan:   Post-operative Plan:   Informed Consent: I have reviewed the patients History and Physical, chart, labs and discussed the procedure including the risks, benefits and alternatives for the proposed anesthesia with the patient or authorized representative who has indicated his/her understanding and acceptance.     Plan Discussed with: CRNA and Surgeon  Anesthesia Plan Comments:         Anesthesia Quick Evaluation  Anesthesia Physical Anesthesia Plan  ASA: III  Anesthesia Plan: General   Post-op Pain Management:    Induction: Intravenous  PONV Risk Score and Plan: 3 and Ondansetron, Dexamethasone and Midazolam  Airway Management Planned: Oral ETT  Additional Equipment:   Intra-op Plan:   Post-operative Plan: Extubation in OR  Informed Consent: I have reviewed the patients History and Physical, chart, labs and discussed the procedure including the risks, benefits and alternatives for the proposed anesthesia with the patient or authorized representative who has indicated his/her understanding and acceptance.   Dental advisory given  Plan Discussed with: CRNA  Anesthesia Plan Comments: (See PAT note 01/03/2018 by Karoline Caldwell, PA-C )       Anesthesia Quick Evaluation

## 2018-03-28 NOTE — Anesthesia Preprocedure Evaluation (Deleted)

## 2018-03-28 NOTE — Anesthesia Preprocedure Evaluation (Addendum)
Anesthesia Evaluation  Patient identified by MRN, date of birth, ID band Patient awake    Reviewed: Allergy & Precautions, NPO status , Patient's Chart, lab work & pertinent test results  Airway Mallampati: II  TM Distance: >3 FB Neck ROM: Full    Dental no notable dental hx.    Pulmonary neg pulmonary ROS, asthma , sleep apnea ,    Pulmonary exam normal breath sounds clear to auscultation       Cardiovascular hypertension, Pt. on medications + DOE  Normal cardiovascular exam+ Valvular Problems/Murmurs  Rhythm:Regular Rate:Normal  EKG: 12/14/2017: Afib. Vent rate 93. Nonspecific ST-T wave changes.  CV: TTE 02/01/2018: Study Conclusions  - Left ventricle: The cavity size was normal. Systolic function was normal. The estimated ejection fraction was in the range of 55% to 60%. - Ventricular septum: The contour showed diastolic flattening and systolic flattening. - Mitral valve: There was mild to moderate regurgitation. - Left atrium: The atrium was mildly dilated. - Right ventricle: The cavity size was moderately dilated. Systolic function was moderately reduced. - Right atrium: The atrium was moderately dilated. - Tricuspid valve: There was moderate regurgitation. - Pulmonary arteries: Systolic pressure was moderately increased. PA peak pressure: 62 mm Hg (S).   Neuro/Psych  Headaches, Seizures -,  Depression  Neuromuscular disease negative psych ROS   GI/Hepatic Neg liver ROS, GERD  ,  Endo/Other  Hypothyroidism Morbid obesity  Renal/GU negative Renal ROS  negative genitourinary   Musculoskeletal  (+) Fibromyalgia -  Abdominal (+) + obese,   Peds negative pediatric ROS (+)  Hematology negative hematology ROS (+)   Anesthesia Other Findings   Reproductive/Obstetrics negative OB ROS                                                              Anesthesia Evaluation   Patient identified by MRN, date of birth, ID band Patient awake    Reviewed: Allergy & Precautions, H&P , NPO status , Patient's Chart, lab work & pertinent test results, reviewed documented beta blocker date and time   Airway Mallampati: II  TM Distance: >3 FB Neck ROM: full    Dental no notable dental hx.    Pulmonary neg pulmonary ROS, shortness of breath, at rest and Long-Term Oxygen Therapy, asthma ,    Pulmonary exam normal breath sounds clear to auscultation       Cardiovascular hypertension, On Medications negative cardio ROS   Rhythm:regular Rate:Normal     Neuro/Psych negative neurological ROS  negative psych ROS   GI/Hepatic negative GI ROS, Neg liver ROS, GERD  ,  Endo/Other  negative endocrine ROSHypothyroidism Morbid obesity  Renal/GU negative Renal ROS  negative genitourinary   Musculoskeletal negative musculoskeletal ROS (+)   Abdominal   Peds negative pediatric ROS (+)  Hematology negative hematology ROS (+)   Anesthesia Other Findings 1.   Super morbid obesity, with failure of 3 gastric bypass procedures 2.   Pulmonary hypertension - PA pressure of 51 mmHg 3.   Stable dyspnea on exertion 4.   Hypertension-controlled  Reproductive/Obstetrics negative OB ROS                             Anesthesia Physical  Anesthesia Plan  ASA: III  Anesthesia Plan: General   Post-op Pain Management:    Induction: Intravenous  PONV Risk Score and Plan: 3 and Treatment may vary due to age or medical condition  Airway Management Planned: Natural Airway and Mask  Additional Equipment:   Intra-op Plan:   Post-operative Plan:   Informed Consent: I have reviewed the patients History and Physical, chart, labs and discussed the procedure including the risks, benefits and alternatives for the proposed anesthesia with the patient or authorized representative who has indicated his/her understanding and acceptance.      Plan Discussed with: CRNA and Surgeon  Anesthesia Plan Comments:         Anesthesia Quick Evaluation  Anesthesia Physical  Anesthesia Plan  ASA: III  Anesthesia Plan: General   Post-op Pain Management:    Induction: Intravenous  PONV Risk Score and Plan: 3 and Ondansetron, Dexamethasone and Midazolam  Airway Management Planned: Oral ETT  Additional Equipment: Arterial line  Intra-op Plan:   Post-operative Plan: Extubation in OR  Informed Consent: I have reviewed the patients History and Physical, chart, labs and discussed the procedure including the risks, benefits and alternatives for the proposed anesthesia with the patient or authorized representative who has indicated his/her understanding and acceptance.     Dental advisory given  Plan Discussed with: CRNA, Anesthesiologist and Surgeon  Anesthesia Plan Comments: (See PAT note 01/03/2018 by Karoline Caldwell, PA-C )       Anesthesia Quick Evaluation

## 2018-03-29 ENCOUNTER — Inpatient Hospital Stay (HOSPITAL_COMMUNITY): Payer: Medicare Other | Admitting: Anesthesiology

## 2018-03-29 ENCOUNTER — Inpatient Hospital Stay (HOSPITAL_COMMUNITY): Payer: Medicare Other

## 2018-03-29 ENCOUNTER — Other Ambulatory Visit: Payer: Self-pay

## 2018-03-29 ENCOUNTER — Encounter (HOSPITAL_COMMUNITY): Payer: Self-pay

## 2018-03-29 ENCOUNTER — Inpatient Hospital Stay (HOSPITAL_COMMUNITY)
Admission: RE | Admit: 2018-03-29 | Discharge: 2018-04-02 | DRG: 483 | Disposition: A | Discharge status: Skilled Nursing Facility | Payer: Medicare Other | Attending: Orthopedic Surgery | Admitting: Orthopedic Surgery

## 2018-03-29 ENCOUNTER — Encounter (HOSPITAL_COMMUNITY)
Admission: RE | Disposition: A | Discharge status: Skilled Nursing Facility | Payer: Self-pay | Source: Home / Self Care | Attending: Orthopedic Surgery

## 2018-03-29 DIAGNOSIS — R739 Hyperglycemia, unspecified: Secondary | ICD-10-CM | POA: Diagnosis not present

## 2018-03-29 DIAGNOSIS — M170 Bilateral primary osteoarthritis of knee: Secondary | ICD-10-CM | POA: Diagnosis present

## 2018-03-29 DIAGNOSIS — M75101 Unspecified rotator cuff tear or rupture of right shoulder, not specified as traumatic: Secondary | ICD-10-CM | POA: Diagnosis present

## 2018-03-29 DIAGNOSIS — J45909 Unspecified asthma, uncomplicated: Secondary | ICD-10-CM | POA: Diagnosis present

## 2018-03-29 DIAGNOSIS — T380X5A Adverse effect of glucocorticoids and synthetic analogues, initial encounter: Secondary | ICD-10-CM | POA: Diagnosis not present

## 2018-03-29 DIAGNOSIS — Z7951 Long term (current) use of inhaled steroids: Secondary | ICD-10-CM | POA: Diagnosis not present

## 2018-03-29 DIAGNOSIS — Z7901 Long term (current) use of anticoagulants: Secondary | ICD-10-CM | POA: Diagnosis not present

## 2018-03-29 DIAGNOSIS — Z9181 History of falling: Secondary | ICD-10-CM | POA: Diagnosis not present

## 2018-03-29 DIAGNOSIS — M12811 Other specific arthropathies, not elsewhere classified, right shoulder: Secondary | ICD-10-CM | POA: Diagnosis not present

## 2018-03-29 DIAGNOSIS — G4730 Sleep apnea, unspecified: Secondary | ICD-10-CM | POA: Diagnosis present

## 2018-03-29 DIAGNOSIS — I110 Hypertensive heart disease with heart failure: Secondary | ICD-10-CM | POA: Diagnosis present

## 2018-03-29 DIAGNOSIS — M159 Polyosteoarthritis, unspecified: Secondary | ICD-10-CM | POA: Diagnosis not present

## 2018-03-29 DIAGNOSIS — I5032 Chronic diastolic (congestive) heart failure: Secondary | ICD-10-CM | POA: Diagnosis not present

## 2018-03-29 DIAGNOSIS — E669 Obesity, unspecified: Secondary | ICD-10-CM

## 2018-03-29 DIAGNOSIS — Z841 Family history of disorders of kidney and ureter: Secondary | ICD-10-CM | POA: Diagnosis not present

## 2018-03-29 DIAGNOSIS — Z886 Allergy status to analgesic agent status: Secondary | ICD-10-CM | POA: Diagnosis not present

## 2018-03-29 DIAGNOSIS — Z833 Family history of diabetes mellitus: Secondary | ICD-10-CM | POA: Diagnosis not present

## 2018-03-29 DIAGNOSIS — M797 Fibromyalgia: Secondary | ICD-10-CM | POA: Diagnosis present

## 2018-03-29 DIAGNOSIS — I509 Heart failure, unspecified: Secondary | ICD-10-CM | POA: Diagnosis present

## 2018-03-29 DIAGNOSIS — D649 Anemia, unspecified: Secondary | ICD-10-CM | POA: Diagnosis not present

## 2018-03-29 DIAGNOSIS — G4733 Obstructive sleep apnea (adult) (pediatric): Secondary | ICD-10-CM | POA: Diagnosis not present

## 2018-03-29 DIAGNOSIS — Z471 Aftercare following joint replacement surgery: Secondary | ICD-10-CM | POA: Diagnosis not present

## 2018-03-29 DIAGNOSIS — Z7401 Bed confinement status: Secondary | ICD-10-CM | POA: Diagnosis not present

## 2018-03-29 DIAGNOSIS — M19011 Primary osteoarthritis, right shoulder: Principal | ICD-10-CM | POA: Diagnosis present

## 2018-03-29 DIAGNOSIS — R41841 Cognitive communication deficit: Secondary | ICD-10-CM | POA: Diagnosis not present

## 2018-03-29 DIAGNOSIS — Z87442 Personal history of urinary calculi: Secondary | ICD-10-CM | POA: Diagnosis not present

## 2018-03-29 DIAGNOSIS — D62 Acute posthemorrhagic anemia: Secondary | ICD-10-CM

## 2018-03-29 DIAGNOSIS — Z8249 Family history of ischemic heart disease and other diseases of the circulatory system: Secondary | ICD-10-CM

## 2018-03-29 DIAGNOSIS — E039 Hypothyroidism, unspecified: Secondary | ICD-10-CM | POA: Diagnosis present

## 2018-03-29 DIAGNOSIS — Z6841 Body Mass Index (BMI) 40.0 and over, adult: Secondary | ICD-10-CM

## 2018-03-29 DIAGNOSIS — Z96611 Presence of right artificial shoulder joint: Secondary | ICD-10-CM | POA: Diagnosis not present

## 2018-03-29 DIAGNOSIS — Z820 Family history of epilepsy and other diseases of the nervous system: Secondary | ICD-10-CM | POA: Diagnosis not present

## 2018-03-29 DIAGNOSIS — M8580 Other specified disorders of bone density and structure, unspecified site: Secondary | ICD-10-CM | POA: Diagnosis not present

## 2018-03-29 DIAGNOSIS — I129 Hypertensive chronic kidney disease with stage 1 through stage 4 chronic kidney disease, or unspecified chronic kidney disease: Secondary | ICD-10-CM | POA: Diagnosis not present

## 2018-03-29 DIAGNOSIS — R2681 Unsteadiness on feet: Secondary | ICD-10-CM | POA: Diagnosis not present

## 2018-03-29 DIAGNOSIS — Z803 Family history of malignant neoplasm of breast: Secondary | ICD-10-CM

## 2018-03-29 DIAGNOSIS — M1990 Unspecified osteoarthritis, unspecified site: Secondary | ICD-10-CM | POA: Diagnosis not present

## 2018-03-29 DIAGNOSIS — Z79890 Hormone replacement therapy (postmenopausal): Secondary | ICD-10-CM | POA: Diagnosis not present

## 2018-03-29 DIAGNOSIS — Z9049 Acquired absence of other specified parts of digestive tract: Secondary | ICD-10-CM | POA: Diagnosis not present

## 2018-03-29 DIAGNOSIS — M6281 Muscle weakness (generalized): Secondary | ICD-10-CM | POA: Diagnosis not present

## 2018-03-29 DIAGNOSIS — K219 Gastro-esophageal reflux disease without esophagitis: Secondary | ICD-10-CM | POA: Diagnosis not present

## 2018-03-29 DIAGNOSIS — Z825 Family history of asthma and other chronic lower respiratory diseases: Secondary | ICD-10-CM

## 2018-03-29 DIAGNOSIS — I4891 Unspecified atrial fibrillation: Secondary | ICD-10-CM | POA: Diagnosis not present

## 2018-03-29 DIAGNOSIS — M25511 Pain in right shoulder: Secondary | ICD-10-CM | POA: Diagnosis not present

## 2018-03-29 DIAGNOSIS — I10 Essential (primary) hypertension: Secondary | ICD-10-CM | POA: Diagnosis not present

## 2018-03-29 DIAGNOSIS — G894 Chronic pain syndrome: Secondary | ICD-10-CM | POA: Diagnosis not present

## 2018-03-29 DIAGNOSIS — N189 Chronic kidney disease, unspecified: Secondary | ICD-10-CM | POA: Diagnosis not present

## 2018-03-29 DIAGNOSIS — E0500 Thyrotoxicosis with diffuse goiter without thyrotoxic crisis or storm: Secondary | ICD-10-CM | POA: Diagnosis not present

## 2018-03-29 DIAGNOSIS — I272 Pulmonary hypertension, unspecified: Secondary | ICD-10-CM | POA: Diagnosis not present

## 2018-03-29 DIAGNOSIS — M2550 Pain in unspecified joint: Secondary | ICD-10-CM | POA: Diagnosis not present

## 2018-03-29 HISTORY — PX: REVERSE SHOULDER ARTHROPLASTY: SHX5054

## 2018-03-29 LAB — BASIC METABOLIC PANEL
Anion gap: 12 (ref 5–15)
BUN: 20 mg/dL (ref 8–23)
CO2: 27 mmol/L (ref 22–32)
Calcium: 7.7 mg/dL — ABNORMAL LOW (ref 8.9–10.3)
Chloride: 101 mmol/L (ref 98–111)
Creatinine, Ser: 0.99 mg/dL (ref 0.44–1.00)
GFR calc Af Amer: >60 mL/min (ref >60)
GFR calc non Af Amer: 60 mL/min — ABNORMAL LOW (ref >60)
Glucose, Bld: 88 mg/dL (ref 70–99)
Potassium: 4.5 mmol/L (ref 3.5–5.1)
Sodium: 140 mmol/L (ref 135–145)

## 2018-03-29 LAB — PROTIME-INR
INR: 1.32
Prothrombin Time: 16.2 seconds — ABNORMAL HIGH (ref 11.4–15.2)

## 2018-03-29 LAB — GLUCOSE, CAPILLARY: Glucose-Capillary: 121 mg/dL — ABNORMAL HIGH (ref 70–99)

## 2018-03-29 SURGERY — ARTHROPLASTY, SHOULDER, TOTAL, REVERSE
Anesthesia: General | Laterality: Right

## 2018-03-29 MED ORDER — SENNA 8.6 MG PO TABS
1.0000 | ORAL_TABLET | Freq: Every day | ORAL | Status: DC
  Administered 2018-03-29 – 2018-04-02 (×5): 8.6 mg via ORAL
  Filled 2018-03-29 (×5): qty 1

## 2018-03-29 MED ORDER — SODIUM CHLORIDE 0.9 % IV SOLN
INTRAVENOUS | Status: DC | PRN
  Administered 2018-03-29: 25 ug/min via INTRAVENOUS

## 2018-03-29 MED ORDER — ONDANSETRON HCL 4 MG/2ML IJ SOLN: 4.0000 mg | Freq: Once | INTRAMUSCULAR | Status: DC | PRN

## 2018-03-29 MED ORDER — BUPIVACAINE-EPINEPHRINE 0.25% -1:200000 IJ SOLN
INTRAMUSCULAR | Status: DC | PRN
  Administered 2018-03-29: 10 mL

## 2018-03-29 MED ORDER — CALCIUM CARBONATE 1250 (500 CA) MG PO TABS
2.0000 | ORAL_TABLET | Freq: Two times a day (BID) | ORAL | Status: DC
  Administered 2018-03-29 – 2018-04-01 (×7): 1000 mg via ORAL
  Filled 2018-03-29 (×12): qty 1

## 2018-03-29 MED ORDER — ONDANSETRON HCL 4 MG PO TABS: 4.0000 mg | ORAL_TABLET | Freq: Four times a day (QID) | ORAL | Status: DC | PRN

## 2018-03-29 MED ORDER — PHENOL 1.4 % MT LIQD: 1.0000 | OROMUCOSAL | Status: DC | PRN

## 2018-03-29 MED ORDER — BUPIVACAINE HCL (PF) 0.25 % IJ SOLN
INTRAMUSCULAR | Status: AC
  Filled 2018-03-29: qty 30

## 2018-03-29 MED ORDER — METOCLOPRAMIDE HCL 5 MG/ML IJ SOLN: 5.0000 mg | Freq: Three times a day (TID) | INTRAMUSCULAR | Status: DC | PRN

## 2018-03-29 MED ORDER — ACETAMINOPHEN 325 MG PO TABS
650.0000 mg | ORAL_TABLET | Freq: Four times a day (QID) | ORAL | Status: DC
  Administered 2018-03-29 – 2018-04-02 (×13): 650 mg via ORAL
  Filled 2018-03-29 (×15): qty 2

## 2018-03-29 MED ORDER — FENTANYL CITRATE (PF) 100 MCG/2ML IJ SOLN: 25.0000 ug | INTRAMUSCULAR | Status: DC | PRN

## 2018-03-29 MED ORDER — DEXAMETHASONE SODIUM PHOSPHATE 10 MG/ML IJ SOLN
INTRAMUSCULAR | Status: DC | PRN
  Administered 2018-03-29: 10 mg via INTRAVENOUS

## 2018-03-29 MED ORDER — LIDOCAINE 2% (20 MG/ML) 5 ML SYRINGE
INTRAMUSCULAR | Status: DC | PRN
  Administered 2018-03-29: 100 mg via INTRAVENOUS

## 2018-03-29 MED ORDER — CALCITRIOL 0.5 MCG PO CAPS
0.5000 ug | ORAL_CAPSULE | Freq: Two times a day (BID) | ORAL | Status: DC
  Administered 2018-03-29 – 2018-04-02 (×8): 0.5 ug via ORAL
  Filled 2018-03-29: qty 2
  Filled 2018-03-29 (×9): qty 1

## 2018-03-29 MED ORDER — METHOCARBAMOL 500 MG PO TABS: 500.0000 mg | ORAL_TABLET | Freq: Three times a day (TID) | ORAL | 1 refills | Status: DC | PRN

## 2018-03-29 MED ORDER — MIRABEGRON ER 50 MG PO TB24
50.0000 mg | ORAL_TABLET | Freq: Every day | ORAL | Status: DC
  Administered 2018-03-30 – 2018-04-02 (×4): 50 mg via ORAL
  Filled 2018-03-29 (×4): qty 1

## 2018-03-29 MED ORDER — MORPHINE SULFATE (PF) 2 MG/ML IV SOLN
1.0000 mg | INTRAVENOUS | Status: DC | PRN
  Administered 2018-03-29 (×3): 2 mg via INTRAVENOUS
  Filled 2018-03-29 (×3): qty 1

## 2018-03-29 MED ORDER — FENTANYL CITRATE (PF) 100 MCG/2ML IJ SOLN
INTRAMUSCULAR | Status: DC | PRN
  Administered 2018-03-29 (×3): 50 ug via INTRAVENOUS

## 2018-03-29 MED ORDER — OXYCODONE HCL 5 MG/5ML PO SOLN: 5.0000 mg | Freq: Once | ORAL | Status: DC | PRN

## 2018-03-29 MED ORDER — METHOCARBAMOL 500 MG PO TABS
500.0000 mg | ORAL_TABLET | Freq: Four times a day (QID) | ORAL | Status: DC | PRN
  Filled 2018-03-29: qty 1

## 2018-03-29 MED ORDER — ONDANSETRON HCL 4 MG/2ML IJ SOLN: 4.0000 mg | Freq: Four times a day (QID) | INTRAMUSCULAR | Status: DC | PRN

## 2018-03-29 MED ORDER — TRAZODONE HCL 50 MG PO TABS
50.0000 mg | ORAL_TABLET | Freq: Every day | ORAL | Status: DC
  Administered 2018-03-30 – 2018-04-01 (×4): 50 mg via ORAL
  Filled 2018-03-29 (×5): qty 1

## 2018-03-29 MED ORDER — MIDAZOLAM HCL 2 MG/2ML IJ SOLN
INTRAMUSCULAR | Status: AC
  Filled 2018-03-29: qty 2

## 2018-03-29 MED ORDER — ACETAMINOPHEN ER 650 MG PO TBCR: 1300.0000 mg | EXTENDED_RELEASE_TABLET | Freq: Two times a day (BID) | ORAL | Status: DC

## 2018-03-29 MED ORDER — KETOROLAC TROMETHAMINE 30 MG/ML IJ SOLN
INTRAMUSCULAR | Status: DC | PRN
  Administered 2018-03-29: 30 mg via INTRAVENOUS

## 2018-03-29 MED ORDER — POLYETHYLENE GLYCOL 3350 17 G PO PACK
17.0000 g | PACK | Freq: Every day | ORAL | Status: DC | PRN
  Filled 2018-03-29 (×2): qty 1

## 2018-03-29 MED ORDER — PREGABALIN 100 MG PO CAPS
200.0000 mg | ORAL_CAPSULE | Freq: Two times a day (BID) | ORAL | Status: DC
  Administered 2018-03-29 – 2018-04-02 (×7): 200 mg via ORAL
  Filled 2018-03-29 (×10): qty 2

## 2018-03-29 MED ORDER — CYCLOBENZAPRINE HCL 10 MG PO TABS
20.0000 mg | ORAL_TABLET | Freq: Two times a day (BID) | ORAL | Status: DC
  Administered 2018-03-29 – 2018-04-02 (×5): 20 mg via ORAL
  Filled 2018-03-29 (×9): qty 2

## 2018-03-29 MED ORDER — ROCURONIUM BROMIDE 50 MG/5ML IV SOSY
PREFILLED_SYRINGE | INTRAVENOUS | Status: DC | PRN
  Administered 2018-03-29: 5 mg via INTRAVENOUS

## 2018-03-29 MED ORDER — OXYCODONE HCL 5 MG PO TABS
5.0000 mg | ORAL_TABLET | ORAL | Status: DC | PRN
  Administered 2018-03-30 – 2018-04-02 (×6): 5 mg via ORAL
  Filled 2018-03-29 (×7): qty 1

## 2018-03-29 MED ORDER — CYANOCOBALAMIN 1000 MCG/ML IJ SOLN: 1000.0000 ug | INTRAMUSCULAR | Status: DC

## 2018-03-29 MED ORDER — LEVOTHYROXINE SODIUM 200 MCG PO TABS
200.0000 ug | ORAL_TABLET | Freq: Every day | ORAL | Status: DC
  Administered 2018-03-30 – 2018-04-02 (×4): 200 ug via ORAL
  Filled 2018-03-29: qty 1
  Filled 2018-03-29: qty 2
  Filled 2018-03-29 (×3): qty 1
  Filled 2018-03-29 (×2): qty 2
  Filled 2018-03-29: qty 1
  Filled 2018-03-29: qty 2

## 2018-03-29 MED ORDER — ROCURONIUM BROMIDE 50 MG/5ML IV SOSY
PREFILLED_SYRINGE | INTRAVENOUS | Status: AC
  Filled 2018-03-29: qty 5

## 2018-03-29 MED ORDER — ACETAMINOPHEN 10 MG/ML IV SOLN
INTRAVENOUS | Status: DC | PRN
  Administered 2018-03-29: 1000 mg via INTRAVENOUS

## 2018-03-29 MED ORDER — MAGNESIUM OXIDE 400 (241.3 MG) MG PO TABS
400.0000 mg | ORAL_TABLET | Freq: Every day | ORAL | Status: DC
  Administered 2018-03-29 – 2018-04-02 (×5): 400 mg via ORAL
  Filled 2018-03-29 (×5): qty 1

## 2018-03-29 MED ORDER — APIXABAN 5 MG PO TABS
5.0000 mg | ORAL_TABLET | Freq: Two times a day (BID) | ORAL | Status: DC
  Administered 2018-03-30 – 2018-04-02 (×7): 5 mg via ORAL
  Filled 2018-03-29 (×8): qty 1

## 2018-03-29 MED ORDER — ONDANSETRON HCL 4 MG/2ML IJ SOLN
INTRAMUSCULAR | Status: AC
  Filled 2018-03-29: qty 2

## 2018-03-29 MED ORDER — DEXAMETHASONE SODIUM PHOSPHATE 10 MG/ML IJ SOLN
INTRAMUSCULAR | Status: AC
  Filled 2018-03-29: qty 1

## 2018-03-29 MED ORDER — TRAMADOL HCL 50 MG PO TABS: 50.0000 mg | ORAL_TABLET | Freq: Four times a day (QID) | ORAL | 0 refills | Status: DC | PRN

## 2018-03-29 MED ORDER — SUGAMMADEX SODIUM 200 MG/2ML IV SOLN
INTRAVENOUS | Status: DC | PRN
  Administered 2018-03-29: 275 mg via INTRAVENOUS

## 2018-03-29 MED ORDER — ACETAMINOPHEN 10 MG/ML IV SOLN
INTRAVENOUS | Status: AC
  Filled 2018-03-29: qty 100

## 2018-03-29 MED ORDER — CARVEDILOL 3.125 MG PO TABS
3.1250 mg | ORAL_TABLET | Freq: Two times a day (BID) | ORAL | Status: DC
  Administered 2018-03-29 – 2018-04-02 (×5): 3.125 mg via ORAL
  Filled 2018-03-29 (×6): qty 1

## 2018-03-29 MED ORDER — OXYCODONE HCL 5 MG PO TABS: 5.0000 mg | ORAL_TABLET | Freq: Once | ORAL | Status: DC | PRN

## 2018-03-29 MED ORDER — MENTHOL 3 MG MT LOZG: 1.0000 | LOZENGE | OROMUCOSAL | Status: DC | PRN

## 2018-03-29 MED ORDER — ZOLPIDEM TARTRATE 5 MG PO TABS
5.0000 mg | ORAL_TABLET | Freq: Every evening | ORAL | Status: DC | PRN
  Administered 2018-03-30 – 2018-04-01 (×4): 5 mg via ORAL
  Filled 2018-03-29 (×4): qty 1

## 2018-03-29 MED ORDER — PROPOFOL 10 MG/ML IV BOLUS
INTRAVENOUS | Status: AC
  Filled 2018-03-29: qty 40

## 2018-03-29 MED ORDER — MEPERIDINE HCL 50 MG/ML IJ SOLN: 6.2500 mg | INTRAMUSCULAR | Status: DC | PRN

## 2018-03-29 MED ORDER — SODIUM CHLORIDE 0.9 % IV SOLN
INTRAVENOUS | Status: DC
  Administered 2018-03-29: 13:00:00 via INTRAVENOUS

## 2018-03-29 MED ORDER — ALBUTEROL SULFATE HFA 108 (90 BASE) MCG/ACT IN AERS
INHALATION_SPRAY | RESPIRATORY_TRACT | Status: AC
  Filled 2018-03-29: qty 6.7

## 2018-03-29 MED ORDER — PROPOFOL 10 MG/ML IV BOLUS
INTRAVENOUS | Status: DC | PRN
  Administered 2018-03-29: 100 mg via INTRAVENOUS

## 2018-03-29 MED ORDER — ACETAMINOPHEN 160 MG/5ML PO SOLN: 325.0000 mg | ORAL | Status: DC | PRN

## 2018-03-29 MED ORDER — PHENYLEPHRINE 40 MCG/ML (10ML) SYRINGE FOR IV PUSH (FOR BLOOD PRESSURE SUPPORT)
PREFILLED_SYRINGE | INTRAVENOUS | Status: DC | PRN
  Administered 2018-03-29: 80 ug via INTRAVENOUS

## 2018-03-29 MED ORDER — FUROSEMIDE 20 MG PO TABS
20.0000 mg | ORAL_TABLET | Freq: Every day | ORAL | Status: DC
  Administered 2018-03-29 – 2018-04-02 (×5): 20 mg via ORAL
  Filled 2018-03-29 (×5): qty 1

## 2018-03-29 MED ORDER — CEFAZOLIN SODIUM-DEXTROSE 2-4 GM/100ML-% IV SOLN
2.0000 g | Freq: Four times a day (QID) | INTRAVENOUS | Status: AC
  Administered 2018-03-29 – 2018-03-30 (×3): 2 g via INTRAVENOUS
  Filled 2018-03-29 (×3): qty 100

## 2018-03-29 MED ORDER — VITAMIN D (ERGOCALCIFEROL) 1.25 MG (50000 UNIT) PO CAPS: 50000.0000 [IU] | ORAL_CAPSULE | ORAL | Status: DC

## 2018-03-29 MED ORDER — DOCUSATE SODIUM 100 MG PO CAPS
100.0000 mg | ORAL_CAPSULE | Freq: Two times a day (BID) | ORAL | Status: DC
  Administered 2018-03-29 – 2018-04-02 (×6): 100 mg via ORAL
  Filled 2018-03-29 (×9): qty 1

## 2018-03-29 MED ORDER — 0.9 % SODIUM CHLORIDE (POUR BTL) OPTIME
TOPICAL | Status: DC | PRN
  Administered 2018-03-29: 1000 mL

## 2018-03-29 MED ORDER — LACTATED RINGERS IV SOLN
INTRAVENOUS | Status: DC | PRN
  Administered 2018-03-29: 07:00:00 via INTRAVENOUS

## 2018-03-29 MED ORDER — ALBUTEROL SULFATE HFA 108 (90 BASE) MCG/ACT IN AERS
INHALATION_SPRAY | RESPIRATORY_TRACT | Status: DC | PRN
  Administered 2018-03-29: 6 via RESPIRATORY_TRACT

## 2018-03-29 MED ORDER — ONDANSETRON HCL 4 MG/2ML IJ SOLN
INTRAMUSCULAR | Status: DC | PRN
  Administered 2018-03-29: 4 mg via INTRAVENOUS

## 2018-03-29 MED ORDER — METOCLOPRAMIDE HCL 5 MG PO TABS: 5.0000 mg | ORAL_TABLET | Freq: Three times a day (TID) | ORAL | Status: DC | PRN

## 2018-03-29 MED ORDER — PHENYLEPHRINE 40 MCG/ML (10ML) SYRINGE FOR IV PUSH (FOR BLOOD PRESSURE SUPPORT)
PREFILLED_SYRINGE | INTRAVENOUS | Status: AC
  Filled 2018-03-29: qty 10

## 2018-03-29 MED ORDER — EPINEPHRINE PF 1 MG/ML IJ SOLN
INTRAMUSCULAR | Status: AC
  Filled 2018-03-29: qty 1

## 2018-03-29 MED ORDER — LIDOCAINE 2% (20 MG/ML) 5 ML SYRINGE
INTRAMUSCULAR | Status: AC
  Filled 2018-03-29: qty 5

## 2018-03-29 MED ORDER — TRAMADOL HCL 50 MG PO TABS
50.0000 mg | ORAL_TABLET | Freq: Four times a day (QID) | ORAL | Status: DC | PRN
  Administered 2018-03-29 – 2018-03-30 (×2): 100 mg via ORAL
  Administered 2018-03-30 (×2): 50 mg via ORAL
  Administered 2018-03-31 – 2018-04-02 (×8): 100 mg via ORAL
  Filled 2018-03-29 (×5): qty 2
  Filled 2018-03-29: qty 1
  Filled 2018-03-29 (×7): qty 2

## 2018-03-29 MED ORDER — FENTANYL CITRATE (PF) 250 MCG/5ML IJ SOLN
INTRAMUSCULAR | Status: AC
  Filled 2018-03-29: qty 5

## 2018-03-29 MED ORDER — DULOXETINE HCL 60 MG PO CPEP
60.0000 mg | ORAL_CAPSULE | Freq: Every day | ORAL | Status: DC
  Administered 2018-03-29 – 2018-04-02 (×5): 60 mg via ORAL
  Filled 2018-03-29 (×5): qty 1

## 2018-03-29 MED ORDER — METHOCARBAMOL 1000 MG/10ML IJ SOLN
500.0000 mg | Freq: Four times a day (QID) | INTRAVENOUS | Status: DC | PRN
  Filled 2018-03-29: qty 5

## 2018-03-29 MED ORDER — ACETAMINOPHEN 325 MG PO TABS: 325.0000 mg | ORAL_TABLET | ORAL | Status: DC | PRN

## 2018-03-29 MED ORDER — BISACODYL 10 MG RE SUPP: 10.0000 mg | Freq: Every day | RECTAL | Status: DC | PRN

## 2018-03-29 SURGICAL SUPPLY — 81 items
BIT DRILL 170X2.5X (BIT) IMPLANT
BIT DRILL 5/64X5 DISP (BIT) ×2 IMPLANT
BIT DRL 170X2.5X (BIT) ×1
BLADE SAG 18X100X1.27 (BLADE) ×2 IMPLANT
COVER SURGICAL LIGHT HANDLE (MISCELLANEOUS) ×2 IMPLANT
COVER WAND RF STERILE (DRAPES) ×2 IMPLANT
CUP D38 DXTEND STAND PLUS 6 HU (Orthopedic Implant) ×2 IMPLANT
CUP STD D38 DXTEND PLUS 6 HU (Orthopedic Implant) IMPLANT
DRAPE INCISE IOBAN 66X45 STRL (DRAPES) ×3 IMPLANT
DRAPE ORTHO SPLIT 77X108 STRL (DRAPES) ×2
DRAPE SURG ORHT 6 SPLT 77X108 (DRAPES) ×2 IMPLANT
DRAPE U-SHAPE 47X51 STRL (DRAPES) ×2 IMPLANT
DRILL 2.5 (BIT) ×1
DRSG ADAPTIC 3X8 NADH LF (GAUZE/BANDAGES/DRESSINGS) ×2 IMPLANT
DRSG PAD ABDOMINAL 8X10 ST (GAUZE/BANDAGES/DRESSINGS) ×3 IMPLANT
DURAPREP 26ML APPLICATOR (WOUND CARE) ×2 IMPLANT
ECCENTRIC EPIPHYSI MODULAR SZ1 (Trauma) IMPLANT
ELECT BLADE 4.0 EZ CLEAN MEGAD (MISCELLANEOUS) ×2
ELECT NDL TIP 2.8 STRL (NEEDLE) ×1 IMPLANT
ELECT NEEDLE TIP 2.8 STRL (NEEDLE) ×2 IMPLANT
ELECT REM PT RETURN 9FT ADLT (ELECTROSURGICAL) ×2
ELECTRODE BLDE 4.0 EZ CLN MEGD (MISCELLANEOUS) ×1 IMPLANT
ELECTRODE REM PT RTRN 9FT ADLT (ELECTROSURGICAL) ×1 IMPLANT
GAUZE SPONGE 4X4 12PLY STRL (GAUZE/BANDAGES/DRESSINGS) ×2 IMPLANT
GAUZE SPONGE 4X4 12PLY STRL LF (GAUZE/BANDAGES/DRESSINGS) ×1 IMPLANT
GLENOSPHERE DXTEND STD 38 (Orthopedic Implant) IMPLANT
GLENSOPHERE DXTEND STD 38 (Orthopedic Implant) ×2 IMPLANT
GLOVE BIOGEL PI ORTHO PRO 7.5 (GLOVE) ×1
GLOVE BIOGEL PI ORTHO PRO SZ8 (GLOVE) ×1
GLOVE ORTHO TXT STRL SZ7.5 (GLOVE) ×2 IMPLANT
GLOVE PI ORTHO PRO STRL 7.5 (GLOVE) ×1 IMPLANT
GLOVE PI ORTHO PRO STRL SZ8 (GLOVE) ×1 IMPLANT
GLOVE SURG ORTHO 8.5 STRL (GLOVE) ×2 IMPLANT
GOWN STRL REUS W/ TWL LRG LVL3 (GOWN DISPOSABLE) ×1 IMPLANT
GOWN STRL REUS W/ TWL XL LVL3 (GOWN DISPOSABLE) ×2 IMPLANT
GOWN STRL REUS W/TWL LRG LVL3 (GOWN DISPOSABLE) ×1
GOWN STRL REUS W/TWL XL LVL3 (GOWN DISPOSABLE) ×2
KIT BASIN OR (CUSTOM PROCEDURE TRAY) ×2 IMPLANT
KIT TURNOVER KIT B (KITS) ×2 IMPLANT
MANIFOLD NEPTUNE II (INSTRUMENTS) ×2 IMPLANT
METAGLENE DELTA EXTEND (Trauma) IMPLANT
METAGLENE DXTEND (Trauma) ×2 IMPLANT
MODULAR ECCENTRIC EPIPHYSI SZ1 (Trauma) ×2 IMPLANT
NDL 1/2 CIR MAYO (NEEDLE) ×1 IMPLANT
NDL HYPO 25GX1X1/2 BEV (NEEDLE) ×1 IMPLANT
NEEDLE 1/2 CIR MAYO (NEEDLE) ×2 IMPLANT
NEEDLE HYPO 25GX1X1/2 BEV (NEEDLE) ×2 IMPLANT
NS IRRIG 1000ML POUR BTL (IV SOLUTION) ×2 IMPLANT
PACK SHOULDER (CUSTOM PROCEDURE TRAY) ×2 IMPLANT
PAD ARMBOARD 7.5X6 YLW CONV (MISCELLANEOUS) ×4 IMPLANT
PIN GUIDE 1.2 (PIN) ×1 IMPLANT
PIN GUIDE GLENOPHERE 1.5MX300M (PIN) ×1 IMPLANT
PIN METAGLENE 2.5 (PIN) ×1 IMPLANT
RESTRAINT HEAD UNIVERSAL NS (MISCELLANEOUS) ×2 IMPLANT
SCREW 4.5X18MM (Screw) ×1 IMPLANT
SCREW 4.5X36MM (Screw) ×1 IMPLANT
SCREW 48L (Screw) ×1 IMPLANT
SCREW BN 18X4.5XSTRL SHLDR (Screw) IMPLANT
SLING ARM FOAM STRAP XLG (SOFTGOODS) ×1 IMPLANT
SPONGE LAP 18X18 X RAY DECT (DISPOSABLE) IMPLANT
SPONGE LAP 4X18 RFD (DISPOSABLE) ×2 IMPLANT
STAPLER VISISTAT (STAPLE) ×1 IMPLANT
STEM 12 HA (Stem) ×1 IMPLANT
STRIP CLOSURE SKIN 1/2X4 (GAUZE/BANDAGES/DRESSINGS) ×2 IMPLANT
SUCTION FRAZIER HANDLE 10FR (MISCELLANEOUS) ×1
SUCTION TUBE FRAZIER 10FR DISP (MISCELLANEOUS) ×1 IMPLANT
SUT FIBERWIRE #2 38 T-5 BLUE (SUTURE) ×4
SUT MNCRL AB 4-0 PS2 18 (SUTURE) ×2 IMPLANT
SUT VIC AB 0 CT1 27 (SUTURE) ×1
SUT VIC AB 0 CT1 27XBRD ANBCTR (SUTURE) IMPLANT
SUT VIC AB 0 CT2 27 (SUTURE) ×2 IMPLANT
SUT VIC AB 2-0 CT1 27 (SUTURE) ×1
SUT VIC AB 2-0 CT1 TAPERPNT 27 (SUTURE) ×1 IMPLANT
SUT VICRYL 0 CT 1 36IN (SUTURE) ×2 IMPLANT
SUTURE FIBERWR #2 38 T-5 BLUE (SUTURE) IMPLANT
SYR CONTROL 10ML LL (SYRINGE) ×2 IMPLANT
TAPE CLOTH SURG 6X10 WHT LF (GAUZE/BANDAGES/DRESSINGS) ×1 IMPLANT
TOWEL OR 17X24 6PK STRL BLUE (TOWEL DISPOSABLE) ×2 IMPLANT
TOWEL OR 17X26 10 PK STRL BLUE (TOWEL DISPOSABLE) ×2 IMPLANT
TOWER CARTRIDGE SMART MIX (DISPOSABLE) IMPLANT
YANKAUER SUCT BULB TIP NO VENT (SUCTIONS) ×2 IMPLANT

## 2018-03-29 NOTE — Progress Notes (Signed)
Per pt. "didn't take coreg this morning because it drops pressure too low". Dr. Ambrose Pancoast notified.

## 2018-03-29 NOTE — Progress Notes (Signed)
PT Cancellation Note  Patient Details Name: Jillian Eaton [facility] MRN: [id number] DOB: 12-02-1950   Cancelled Treatment:    Reason Eval/Treat Not Completed: Patient declined, no reason specified Pt declines PT evaluation at this time, "dont feel like it," , "I will be ready tomorrow." Will follow.   Marguarite Arbour A Laurieanne Galloway 03/29/2018, 3:14 PM Wray Kearns, PT, DPT Acute Rehabilitation Services Pager [phone] Office [phone]

## 2018-03-29 NOTE — Interval H&P Note (Signed)
History and Physical Interval Note:  03/29/2018 7:26 AM  Jillian Eaton  has presented today for surgery, with the diagnosis of Right shoulder rotator cuff arthropathy  The various methods of treatment have been discussed with the patient and family. After consideration of risks, benefits and other options for treatment, the patient has consented to  Procedure(s): REVERSE SHOULDER ARTHROPLASTY (Right) as a surgical intervention .  The patient's history has been reviewed, patient examined, no change in status, stable for surgery.  I have reviewed the patient's chart and labs.  Questions were answered to the patient's satisfaction.     Augustin Schooling

## 2018-03-29 NOTE — Anesthesia Postprocedure Evaluation (Signed)
Anesthesia Post Note  Patient: Jillian Eaton  Procedure(s) Performed: REVERSE SHOULDER ARTHROPLASTY (Right )     Patient location during evaluation: PACU Anesthesia Type: General Level of consciousness: awake and alert Pain management: pain level controlled Vital Signs Assessment: post-procedure vital signs reviewed and stable Respiratory status: spontaneous breathing, nonlabored ventilation, respiratory function stable and patient connected to nasal cannula oxygen Cardiovascular status: blood pressure returned to baseline and stable Postop Assessment: no apparent nausea or vomiting Anesthetic complications: no    Last Vitals:  Vitals:   03/29/18 1131 03/29/18 1207  BP: 101/72 109/73  Pulse: 100 96  Resp: 20 17  Temp: 36.5 C (!) 36.3 C  SpO2: 95% 95%    Last Pain:  Vitals:   03/29/18 1207  TempSrc: Oral  PainSc:                  Rodolph Hagemann

## 2018-03-29 NOTE — Transfer of Care (Signed)
Immediate Anesthesia Transfer of Care Note  Patient: Jillian Eaton  Procedure(s) Performed: REVERSE SHOULDER ARTHROPLASTY (Right )  Patient Location: PACU  Anesthesia Type:General  Level of Consciousness: drowsy  Airway & Oxygen Therapy: Patient Spontanous Breathing and Patient connected to face mask oxygen  Post-op Assessment: Report given to RN and Post -op Vital signs reviewed and stable  Post vital signs: Reviewed and stable  Last Vitals:  Vitals Value Taken Time  BP 97/57 03/29/2018 10:04 AM  Temp 36.5 C 03/29/2018 10:04 AM  Pulse 111 03/29/2018 10:05 AM  Resp 14 03/29/2018 10:05 AM  SpO2 94 % 03/29/2018 10:05 AM  Vitals shown include unvalidated device data.  Last Pain:  Vitals:   03/29/18 1004  PainSc: (P) Asleep         Complications: No apparent anesthesia complications

## 2018-03-29 NOTE — Anesthesia Procedure Notes (Signed)
Procedure Name: Intubation Date/Time: 03/29/2018 7:51 AM Performed by: Candis Shine, CRNA Pre-anesthesia Checklist: Patient identified, Emergency Drugs available, Suction available and Patient being monitored Patient Re-evaluated:Patient Re-evaluated prior to induction Oxygen Delivery Method: Circle System Utilized Preoxygenation: Pre-oxygenation with 100% oxygen Induction Type: IV induction Ventilation: Mask ventilation without difficulty Laryngoscope Size: Mac and 4 Grade View: Grade I Tube type: Oral Tube size: 7.0 mm Number of attempts: 1 Airway Equipment and Method: Stylet Placement Confirmation: ETT inserted through vocal cords under direct vision,  positive ETCO2 and breath sounds checked- equal and bilateral Secured at: 21 cm Tube secured with: Tape Dental Injury: Teeth and Oropharynx as per pre-operative assessment

## 2018-03-29 NOTE — Progress Notes (Signed)
This charge nurse called [facility] regarding patient being unresponsive.  Was advised from [facility] RN to administer one dose of Narcan full dose.  Patient is currently awake and responding to RN's.   This charge nurse called [facility] to notify Dr. Veverly Fells of change in patient's status and advisement on how to proceed with scheduled night medications.   [facility] RN at the bedside and the patient's RN is at the bedside. Nursing will continue to monitor.

## 2018-03-29 NOTE — Anesthesia Procedure Notes (Signed)
Arterial Line Insertion Start/End2/08/2018 7:25 AM Performed by: Elayne Snare, CRNA, CRNA  Patient location: Pre-op. Preanesthetic checklist: patient identified, IV checked, site marked, risks and benefits discussed, surgical consent, monitors and equipment checked, pre-op evaluation, timeout performed and anesthesia consent Lidocaine 1% used for infiltration Left, radial was placed Catheter size: 20 G Hand hygiene performed  and maximum sterile barriers used   Attempts: 2 Procedure performed without using ultrasound guided technique. Following insertion, dressing applied and Biopatch. Post procedure assessment: normal and unchanged  Patient tolerated the procedure well with no immediate complications.

## 2018-03-29 NOTE — Progress Notes (Signed)
Pt keeps stating her pain is not controlled tried PO tramadol at 1228. Still no relief asked RN for something else RN gave morphine, pt on O2 and encouraged to use the incentive. Will continue to monitor

## 2018-03-29 NOTE — Op Note (Signed)
NAME: QUINTARA, JUERGENSEN MEDICAL RECORD XL:[id number] ACCOUNT [id number] DATE OF BIRTH:[birth year] FACILITY: [facility] LOCATION: [facility] PHYSICIAN:STEVEN R. Bari Handshoe, MD  OPERATIVE REPORT  DATE OF PROCEDURE:  03/29/2018  PREOPERATIVE DIAGNOSIS:  Right shoulder rotator cuff tear arthropathy.  POSTOPERATIVE DIAGNOSIS:  Right shoulder rotator cuff tear arthropathy.  PROCEDURE PERFORMED:  Right reverse total shoulder arthroplasty using DePuy Delta Xtend prosthesis with subscapularis repair.  ATTENDING SURGEON:  Malon Kindle, MD  ASSISTANT:  Modesto Charon, [place], who was scrubbed during the entire procedure and necessary for satisfactory completion of surgery.  ANESTHESIA:  General anesthesia was used plus local.  ESTIMATED BLOOD LOSS:  200 mL  FLUID REPLACEMENT:  1500 mL crystalloid.  INSTRUMENT COUNTS:  Correct.  COMPLICATIONS:  No complications.  ANTIBIOTICS:  Perioperative antibiotics were given.  INDICATIONS:  The patient is a 68 year old female with worsening right shoulder pain secondary to rotator cuff insufficiency and rotator cuff tear arthropathy.  The patient has had progressive pain despite conservative management has interferences with  all ADLs and is beginning to interfere with mobility.  The patient presents now for right reverse shoulder arthroplasty to restore function and eliminate pain to the shoulder.  Informed consent obtained.  DESCRIPTION OF PROCEDURE:  After an adequate level of anesthesia was achieved, the patient was positioned in modified beach chair position.  Right shoulder correctly identified and sterilely prepped and draped in the usual manner.  Timeout called.  We  entered the patient's shoulder using standard deltopectoral incision started the coracoid process extending down to the anterior humerus.  Dissection down through subcutaneous tissues using Bovie electrocautery.  Cephalic vein identified, taken  laterally, the deltoid pectoralis  taken medially.  Conjoined tendon identified and retracted medially.  Biceps was tenodesed in situ.  Deep retractors placed.  Subscapularis release subperiosteally off the lesser tuberosity and tagged for repair at the  end with #2 FiberWire suture in a modified Mason-Allen suture technique.  We released the capsule off the inferior neck of the humerus progressively externally rotating.  We then released the biceps tendon at the joint line and also the remaining  supraspinatus tendon.  There was not much.  The majority of the supraspinatus and all the infraspinatus was torn.  It was unclear to me if the teres minor was in very good shape either.  It looked like some of that was compromised.  We extended the  shoulder delivering the humerus out of the wound.  We entered the proximal humerus with a 6 mm reamer, reamed up to a size 12 and then we placed our 12 mm guide for the head resection.  We resected at 10 mm of retroversion and removed the excess  osteophytes with a rongeur over the medial side of the neck and head.  We then went ahead and subluxed the humerus back into the wound.  We gained excellent exposure of the glenoid face.  Did a capsular labral excision and freed up the subscapularis.  We  placed our deep retractors.  We identified our center point for a guide pin.  We placed our guide pin for the metaglene baseplate preparation.  We then reamed for the metaglene baseplate.  We used our peripheral hand reamer and then drilled out the  central peg hole and impacted the metaglene into position.  We placed a 48 screw inferiorly, a 36 at the base of the coracoid and an 18 nonlocked posteriorly.  We had good baseplate security.  We irrigated thoroughly and then placed a 38 standard  glenosphere into position and impacted that and screwed that home.  That was well fixed and we did a finger sweep to make sure there was no soft tissue that got caught underneath the glenosphere.  We next directed our  attention back towards the humeral  side.  We used the reamer to ream for the size 1 right metaphyseal component.  We then used the 12 body 1 right set on the 0 setting and placed in 10 degrees of retroversion and impacted that into position, reduced the shoulder with a 38+3 poly and felt  like the 38+6 would be the better fit for her.  We removed the trial components, irrigated thoroughly.  Drill holes in the lesser tuberosity and placed #2 FiberWire suture into these drill holes.  We then went ahead and used available bone graft from the  head and we selected the HA press-fit stem for the 12 mm stem and the size 1 right set on the 0 setting and we impacted that in 10 degrees of retroversion using bone graft with impaction grafting technique.  We had excellent stem security and went ahead  and selected a 38+6 poly and impacted that into position, reduced the shoulder.  We had a nice little pop as it reduced with inferior pole on the elbow.  Everything moved together as a unit.  Very minimal gapping with max external rotation and conjoined  appropriately tensioned.  We then repaired the subscapularis after thorough irrigation.  Repaired the subscapularis anatomically back to bone.  We then irrigated again and then repaired the deltopectoral interval with 0 Vicryl suture followed by 2-0  Vicryl for subcutaneous closure and 4-0 Monocryl for skin.  Steri-Strips were applied followed by sterile dressing and a shoulder sling.  The patient was transported to recovery room in stable condition.  TN/NUANCE  D:03/29/2018 T:03/29/2018 JOB:[id number]

## 2018-03-29 NOTE — Brief Op Note (Signed)
03/29/2018  9:59 AM  PATIENT:  Jillian Eaton  68 y.o. female  PRE-OPERATIVE DIAGNOSIS:  Right shoulder rotator cuff tear arthropathy  POST-OPERATIVE DIAGNOSIS:  Right shoulder rotator cuff tear arthropathy  PROCEDURE:  Procedure(s): REVERSE SHOULDER ARTHROPLASTY (Right) DePuy Delta Xtend  SURGEON:  Surgeon(s) and Role:    Netta Cedars, MD - Primary  PHYSICIAN ASSISTANT:   ASSISTANTS: Ventura Bruns, PA-C   ANESTHESIA:   general  EBL:  100 mL   BLOOD ADMINISTERED:none  DRAINS: none   LOCAL MEDICATIONS USED:  MARCAINE     SPECIMEN:  No Specimen  DISPOSITION OF SPECIMEN:  N/A  COUNTS:  YES  TOURNIQUET:  * No tourniquets in log *  DICTATION: .Other Dictation: Dictation Number [id number]  PLAN OF CARE: Admit to inpatient   PATIENT DISPOSITION:  PACU - hemodynamically stable.   Delay start of Pharmacological VTE agent (>24hrs) due to surgical blood loss or risk of bleeding: no

## 2018-03-30 LAB — BASIC METABOLIC PANEL
Anion gap: 11 (ref 5–15)
BUN: 15 mg/dL (ref 8–23)
CO2: 30 mmol/L (ref 22–32)
Calcium: 6.9 mg/dL — ABNORMAL LOW (ref 8.9–10.3)
Chloride: 98 mmol/L (ref 98–111)
Creatinine, Ser: 0.9 mg/dL (ref 0.44–1.00)
GFR calc Af Amer: >60 mL/min (ref >60)
GFR calc non Af Amer: >60 mL/min (ref >60)
Glucose, Bld: 113 mg/dL — ABNORMAL HIGH (ref 70–99)
POTASSIUM: 5 mmol/L (ref 3.5–5.1)
Sodium: 139 mmol/L (ref 135–145)

## 2018-03-30 LAB — HEMOGLOBIN AND HEMATOCRIT, BLOOD
HCT: 32.7 % — ABNORMAL LOW (ref 36.0–46.0)
Hemoglobin: 9.8 g/dL — ABNORMAL LOW (ref 12.0–15.0)

## 2018-03-30 MED ORDER — SODIUM CHLORIDE 0.9% FLUSH
10.0000 mL | INTRAVENOUS | Status: DC | PRN
  Administered 2018-04-01: 10 mL
  Filled 2018-03-30: qty 40

## 2018-03-30 MED ORDER — NALOXONE HCL 0.4 MG/ML IJ SOLN
0.4000 mg | INTRAMUSCULAR | Status: DC | PRN
  Administered 2018-03-29: 0.4 mg via INTRAVENOUS

## 2018-03-30 NOTE — Progress Notes (Addendum)
Physical Therapy Evaluation Patient Details Name: Jillian Eaton MRN: [id number] DOB: 09-Jul-1950 Today's Date: 03/30/2018   History of Present Illness  This 68 y.o. female admitted for Rt reverse TSA due to Rt shoulder rotator cuff arthropathy.  PMH includes: Anemia of chronic disease, pulmonary HTH, morbid obesity, essential HTN, h/o seizures, A-Fib, chronic pain syndrome, acute diastolic HF,   Clinical Impression  Pt was able to stand EOB with recliner chair positioned in front of her for leverage.  She is heavily reliant on bil UEs on RW for gait at baseline, so this will be a challenging recovery for her to ambulate safely.  She is most appropriate for post acute rehab and we are asking our CIR team to screen her now.  I would like to attempt a hemi walker next session to see if this is at all a reasonable AD for transfers/short distance gait.  PT will continue to follow acutely for safe mobility progression    Follow Up Recommendations CIR    Equipment Recommendations  Other (comment)(hemi walker?)    Recommendations for Other Services Rehab consult     Precautions / Restrictions Precautions Precautions: Fall;Shoulder Type of Shoulder Precautions: conservative.  No ROM of shoulder  Shoulder Interventions: At all times;Off for dressing/bathing/exercises Precaution Booklet Issued: Yes (comment) Precaution Comments: shoulder hand out provided  Required Braces or Orthoses: Sling Restrictions Weight Bearing Restrictions: Yes RUE Weight Bearing: Non weight bearing      Mobility  Bed Mobility Overal bed mobility: Needs Assistance Bed Mobility: Supine to Sit;Sit to Supine     Supine to sit: Min guard;HOB elevated     General bed mobility comments: Min guard assist and cues not to use R amr  Transfers Overall transfer level: Needs assistance Equipment used: (recliner turned backward for leverage.) Transfers: Sit to/from Stand Sit to Stand: Min assist          General transfer comment: Min assist to stand EOB with recliner turned backwards  Ambulation/Gait Ambulation/Gait assistance: Min assist Gait Distance (Feet): 3 Feet Assistive device: (side step holding the back of the recliner chair) Gait Pattern/deviations: Step-to pattern     General Gait Details: side step up to [facility] holding onto the back of the recliner chair.          Balance Overall balance assessment: Needs assistance Sitting-balance support: Feet supported Sitting balance-Leahy Scale: Good Sitting balance - Comments: able to reach down and apply lotion to bil lower legs   Standing balance support: Single extremity supported Standing balance-Leahy Scale: Poor Standing balance comment: needs external support in standing. heavy reliance on UEs for gait on RW at baseline.                              Pertinent Vitals/Pain Pain Assessment: 0-10 Pain Score: 8  Pain Location: Rt shoulder  Pain Descriptors / Indicators: Operative site guarding Pain Intervention(s): Limited activity within patient's tolerance;Monitored during session;Repositioned    Home Living Family/patient expects to be discharged to:: Private residence Living Arrangements: Spouse/significant other Available Help at Discharge: Family;Available 24 hours/day Type of Home: House Home Access: Level entry     Home Layout: One level Home Equipment: Walker - 2 wheels;Adaptive equipment;Grab bars - toilet;Grab bars - tub/shower Additional Comments: spouse has some health issues and is unable to do heavy lifting     Prior Function Level of Independence: Independent with assistive device(s)         Comments:  Pt reports she was performing ADLs mod I, and ambulated with RW      Hand Dominance   Dominant Hand: Right    Extremity/Trunk Assessment   Upper Extremity Assessment Upper Extremity Assessment: Defer to OT evaluation(cannot lift left arm well)    Lower Extremity  Assessment Lower Extremity Assessment: Generalized weakness RLE Deficits / Details: bil knee OA, left worse than R LLE Deficits / Details: bil knee OA, left worse than R    Cervical / Trunk Assessment Cervical / Trunk Assessment: Normal  Communication   Communication: No difficulties  Cognition Arousal/Alertness: Awake/alert Behavior During Therapy: WFL for tasks assessed/performed Overall Cognitive Status: Within Functional Limits for tasks assessed                                        General Comments General comments (skin integrity, edema, etc.): VSS        Assessment/Plan    PT Assessment Patient needs continued PT services  PT Problem List Decreased strength;Decreased range of motion;Decreased activity tolerance;Decreased mobility;Decreased balance;Decreased knowledge of use of DME;Decreased knowledge of precautions;Obesity;Pain       PT Treatment Interventions DME instruction;Gait training;Functional mobility training;Therapeutic activities;Therapeutic exercise;Balance training;Patient/family education;Modalities    PT Goals (Current goals can be found in the Care Plan section)  Acute Rehab PT Goals Patient Stated Goal: to be able to take care of self  PT Goal Formulation: With patient Time For Goal Achievement: 04/13/18 Potential to Achieve Goals: Good    Frequency Min 3X/week           AM-PAC PT "6 Clicks" Mobility  Outcome Measure Help needed turning from your back to your side while in a flat bed without using bedrails?: A Little Help needed moving from lying on your back to sitting on the side of a flat bed without using bedrails?: A Little Help needed moving to and from a bed to a chair (including a wheelchair)?: A Lot Help needed standing up from a chair using your arms (e.g., wheelchair or bedside chair)?: A Lot Help needed to walk in hospital room?: A Lot Help needed climbing 3-5 steps with a railing? : Total 6 Click Score: 13     End of Session Equipment Utilized During Treatment: Other (comment)(R UE sling) Activity Tolerance: Patient tolerated treatment well Patient left: in bed;with call bell/phone within reach Nurse Communication: Mobility status PT Visit Diagnosis: Unsteadiness on feet (R26.81);Muscle weakness (generalized) (M62.81);Difficulty in walking, not elsewhere classified (R26.2);Pain Pain - Right/Left: Right Pain - part of body: Shoulder    Time: 1518-1615(time spent documenting in her room to allow her to sit EOB) PT Time Calculation (min) (ACUTE ONLY): 57 min   Charges:          Lurena Joiner B. Nupur Hohman, PT, DPT  Acute Rehabilitation #[phone] pager #[phone] office   PT Evaluation $PT Eval Moderate Complexity: 1 Mod PT Treatments $Therapeutic Activity: 8-22 mins        03/30/2018, 6:00 PM

## 2018-03-30 NOTE — Evaluation (Signed)
Occupational Therapy Evaluation Patient Details Name: Jillian Eaton [facility] MRN: [id number] DOB: 1950-10-04 Today's Date: 03/30/2018    History of Present Illness This 68 y.o. female admitted for Rt reverse TSH due to Rt shoulder rotator cuff arthropathy.  PMH includes: Anemia of chronic disease, pulmonary HTH, morbid obesity, essential HTN, h/o seizures, A-Fib, chronic pain syndrome, acute diastolic HF,    Clinical Impression   Pt admitted with above. She demonstrates the below listed deficits and will benefit from continued OT to maximize safety and independence with BADLs.  Pt currently requires mod - total A for ADLs and was unable to fully move sit to stand with +2 assist.  She requires mod cues for shoulder precautions.  PTA, pt lived with spouse and was mod I with ADLs and functional mobility with use of RW.  Pt is very motivated to regain her independence, and feel she would benefit from the intensity of CIR if it would be an option, and she likely could progress to a level that spouse could assist her at home.  If this is not an option, she will need SNF level rehab.       Follow Up Recommendations  CIR;Supervision/Assistance - 24 hour    Equipment Recommendations  None recommended by OT    Recommendations for Other Services Rehab consult     Precautions / Restrictions Precautions Precautions: Fall;Shoulder Type of Shoulder Precautions: conservative.  No ROM of shoulder  Shoulder Interventions: At all times;Off for dressing/bathing/exercises Precaution Booklet Issued: Yes (comment) Precaution Comments: shoulder hand out provided  Required Braces or Orthoses: Sling Restrictions Weight Bearing Restrictions: Yes RUE Weight Bearing: Non weight bearing      Mobility Bed Mobility Overal bed mobility: Needs Assistance Bed Mobility: Supine to Sit;Sit to Supine     Supine to sit: Min guard;HOB elevated Sit to supine: Mod assist;+2 for physical assistance   General bed  mobility comments: pt requires cues for technique, and required assist to lift LEs back into bed   Transfers Overall transfer level: Needs assistance Equipment used: 1 person hand held assist(+2 assist ) Transfers: Sit to/from Stand Sit to Stand: Max assist;+2 safety/equipment         General transfer comment: Attempted sit to stand x 3.  On first attempt pt able to lift buttocks ~4 inches with mod A, but unable to extend hips, knees.  Second attempt, required max A +2 to lift buttocks and unble to extend hips/knees.  On third attempt,  stedy was reversed and facing pt.  Using Lt UE to pull, pt able to move sit to 3/4 stand, but unable to fully extend, and returned to sitting.   Pt spontaneously attempted to move Rt UE forward to grab stedy bar to assist her in coming into full standing and required mod verbal as well as tactile cues to avoid this     Balance Overall balance assessment: Needs assistance Sitting-balance support: Feet supported Sitting balance-Leahy Scale: Good                                     ADL either performed or assessed with clinical judgement   ADL Overall ADL's : Needs assistance/impaired Eating/Feeding: Set up;Sitting   Grooming: Wash/dry hands;Wash/dry face;Oral care;Set up;Sitting   Upper Body Bathing: Sitting;Moderate assistance   Lower Body Bathing: Maximal assistance;Sitting/lateral leans;Bed level   Upper Body Dressing : Moderate assistance;Sitting   Lower Body Dressing: Total assistance;Bed  level;Sitting/lateral leans   Toilet Transfer: Total assistance Toilet Transfer Details (indicate cue type and reason): unable  Toileting- Clothing Manipulation and Hygiene: Total assistance;Bed level       Functional mobility during ADLs: Maximal assistance;+2 for physical assistance       Vision         Perception     Praxis      Pertinent Vitals/Pain Pain Assessment: 0-10 Pain Score: 6  Pain Location: Rt shoulder  Pain  Descriptors / Indicators: Operative site guarding Pain Intervention(s): Monitored during session;Repositioned;Patient requesting pain meds-RN notified     Hand Dominance Right   Extremity/Trunk Assessment Upper Extremity Assessment Upper Extremity Assessment: RUE deficits/detail RUE Deficits / Details: elbow distally WFL    Lower Extremity Assessment Lower Extremity Assessment: Defer to PT evaluation       Communication Communication Communication: No difficulties   Cognition Arousal/Alertness: Awake/alert Behavior During Therapy: WFL for tasks assessed/performed Overall Cognitive Status: Within Functional Limits for tasks assessed                                     General Comments  02 sats 87-93% on 4L supplemental 02.  reviewed shoulder precautions with pt - mod cues required     Exercises     Shoulder Instructions      Home Living Family/patient expects to be discharged to:: Private residence Living Arrangements: Spouse/significant other Available Help at Discharge: Family;Available 24 hours/day Type of Home: House Home Access: Level entry     Home Layout: One level     Bathroom Shower/Tub: Occupational psychologist: Handicapped height Bathroom Accessibility: Yes   Home Equipment: Environmental consultant - 2 wheels;Adaptive equipment;Grab bars - toilet;Grab bars - tub/shower Adaptive Equipment: Reacher;Sock aid;Long-handled shoe horn;Long-handled sponge Additional Comments: spouse has some health issues and is unable to do heavy lifting       Prior Functioning/Environment Level of Independence: Independent with assistive device(s)        Comments: Pt reports she was performing ADLs mod I, and ambulated with RW         OT Problem List: Decreased strength;Decreased activity tolerance;Impaired balance (sitting and/or standing);Decreased knowledge of use of DME or AE;Decreased knowledge of precautions;Obesity;Impaired UE functional use;Pain       OT Treatment/Interventions: Self-care/ADL training;Therapeutic exercise;DME and/or AE instruction;Therapeutic activities;Patient/family education;Balance training    OT Goals(Current goals can be found in the care plan section) Acute Rehab OT Goals Patient Stated Goal: to be able to take care of self  OT Goal Formulation: With patient Time For Goal Achievement: 04/13/18 Potential to Achieve Goals: Good  OT Frequency: Min 2X/week   Barriers to D/C: Decreased caregiver support  spouse unable to provide necessary level of assist        Co-evaluation              AM-PAC OT "6 Clicks" Daily Activity     Outcome Measure Help from another person eating meals?: None Help from another person taking care of personal grooming?: A Little Help from another person toileting, which includes using toliet, bedpan, or urinal?: Total Help from another person bathing (including washing, rinsing, drying)?: A Lot Help from another person to put on and taking off regular upper body clothing?: A Lot Help from another person to put on and taking off regular lower body clothing?: Total 6 Click Score: 13   End of Session Equipment Utilized During  Treatment: Gait belt;Oxygen;Other (comment)(sling ) Nurse Communication: Mobility status;Patient requests pain meds  Activity Tolerance: Patient tolerated treatment well Patient left: in bed;with call bell/phone within reach  OT Visit Diagnosis: Pain;Muscle weakness (generalized) (M62.81) Pain - Right/Left: Right Pain - part of body: Shoulder                Time: 9927-8004 OT Time Calculation (min): 57 min Charges:  OT General Charges $OT Visit: 1 Visit OT Evaluation $OT Eval Moderate Complexity: 1 Mod OT Treatments $Therapeutic Activity: 38-52 mins  Lucille Passy, OTR/L Acute Rehabilitation Services Pager [phone] Office [phone]   Lucille Passy M 03/30/2018, 2:49 PM

## 2018-03-30 NOTE — Significant Event (Addendum)
Rapid Response Event Note  Overview: Neurologic - Unresponsive   Initial Focused Assessment: Called by 5N RNs about patient being unresponsive, I was with another patient in a medical emergency, I asked if the patient had any pain medication, nurse stated stated patient had morphine. I asked that the nurse given 0.2 mg Narcan IV x 1 and if that does not work, give another dose 0.2 mg Narcan IV x 1. Upon arrival, patient was awake, endorses pain, able to follow commands and move all extremities, oriented x 3. VSS. 98% on 2L [place].   Interventions: -- Narcan 0.4 mg IV  Plan of Care: -- I spoke with Ortho PA, updated her on status of patient. Will have RN give scheduled nightly medications that patient takes at home for now. PRN OXY IR 5 mg ordered and morphine was discontinued.  -- I called at 0400 for an update, per RN, patient is resting comfortably.  Event Summary:    at    Call Time [place], [place]

## 2018-03-30 NOTE — Progress Notes (Signed)
Orthopedics Progress Note  Subjective: Stable overnight, some pain this AM  Objective:  Vitals:   03/29/18 2340 03/30/18 0414  BP: 123/70 90/65  Pulse: 95 98  Resp:  16  Temp:  97.7 F (36.5 C)  SpO2: 98% 100%    General: Awake and alert  Musculoskeletal: Right shoulder dressing intact, wiggles fingers Neurovascularly intact  Lab Results  Component Value Date   WBC 5.0 03/25/2018   HGB 9.8 (L) 03/30/2018   HCT 32.7 (L) 03/30/2018   MCV 94.1 03/25/2018   PLT 164 03/25/2018       Component Value Date/Time   NA 139 03/30/2018 0401   NA 146 (H) 06/19/2017 1222   K 5.0 03/30/2018 0401   CL 98 03/30/2018 0401   CO2 30 03/30/2018 0401   GLUCOSE 113 (H) 03/30/2018 0401   BUN 15 03/30/2018 0401   BUN 21 06/19/2017 1222   CREATININE 0.90 03/30/2018 0401   CREATININE 1.01 (H) 11/27/2017 1029   CALCIUM 6.9 (L) 03/30/2018 0401   CALCIUM 5.5 (LL) 07/10/2017 2104   GFRNONAA >60 03/30/2018 0401   GFRNONAA 58 (L) 11/27/2017 1029   GFRAA >60 03/30/2018 0401   GFRAA 68 11/27/2017 1029    Lab Results  Component Value Date   INR 1.32 03/29/2018   INR 1.29 01/11/2018   INR 1.14 08/28/2016    Assessment/Plan: POD #1 s/p Procedure(s): REVERSE SHOULDER ARTHROPLASTY OT, conservative protocol SNF Monday  Remo Lipps R. Veverly Fells, MD 03/30/2018 7:17 AM

## 2018-03-30 NOTE — Progress Notes (Signed)
This RN went to patient room to give PM med. RN noted that patient  was not responding, CN was notified and Rapid response was called. Patient VS obtained and stable.  Order received for Narcan.  Patient is awake and  alert and after 0.4mg  of Narcan. VS and CBG obtained.  Rapid response nurse at bedside. Will continue to monitor.

## 2018-03-30 NOTE — Plan of Care (Signed)
  Problem: Education: Goal: Knowledge of the prescribed therapeutic regimen will improve Outcome: Progressing   Problem: Activity: Goal: Ability to tolerate increased activity will improve Outcome: Progressing   Problem: Pain Management: Goal: Pain level will decrease with appropriate interventions Outcome: Progressing   

## 2018-03-30 NOTE — Discharge Instructions (Signed)
Ice to the shoulder constantly.  Keep the incision covered and clean and dry for one week, then ok to get it wet in the shower.   Do exercise as instructed several times per day.No WEIGHT BEARING with the right arm.   DO NOT reach behind your back or push up out of a chair with the operative arm.  Use a sling while you are up and around for comfort, may remove while seated.  Keep pillow propped behind the operative elbow.  Follow up with Dr Veverly Fells in two weeks in the office, call [phone] for appt  Information on my medicine - ELIQUIS (apixaban)  This medication education was reviewed with me or my healthcare representative as part of my discharge preparation.    Why was Eliquis prescribed for you? Eliquis was prescribed for you to reduce the risk of forming blood clots that can cause a stroke if you have a medical condition called atrial fibrillation (a type of irregular heartbeat) OR to reduce the risk of a blood clots forming after orthopedic surgery.  What do You need to know about Eliquis ? Take your Eliquis TWICE DAILY - one tablet in the morning and one tablet in the evening with or without food.  It would be best to take the doses about the same time each day.  If you have difficulty swallowing the tablet whole please discuss with your pharmacist how to take the medication safely.  Take Eliquis exactly as prescribed by your doctor and DO NOT stop taking Eliquis without talking to the doctor who prescribed the medication.  Stopping may increase your risk of developing a new clot or stroke.  Refill your prescription before you run out.  After discharge, you should have regular check-up appointments with your healthcare provider that is prescribing your Eliquis.  In the future your dose may need to be changed if your kidney function or weight changes by a significant amount or as you get older.  What do you do if you miss a dose? If you miss a dose, take it as soon as you  remember on the same day and resume taking twice daily.  Do not take more than one dose of ELIQUIS at the same time.  Important Safety Information A possible side effect of Eliquis is bleeding. You should call your healthcare provider right away if you experience any of the following: ? Bleeding from an injury or your nose that does not stop. ? Unusual colored urine (red or dark brown) or unusual colored stools (red or black). ? Unusual bruising for unknown reasons. ? A serious fall or if you hit your head (even if there is no bleeding).  Some medicines may interact with Eliquis and might increase your risk of bleeding or clotting while on Eliquis. To help avoid this, consult your healthcare provider or pharmacist prior to using any new prescription or non-prescription medications, including herbals, vitamins, non-steroidal anti-inflammatory drugs (NSAIDs) and supplements.  This website has more information on Eliquis (apixaban): [email or web address].

## 2018-03-30 NOTE — Progress Notes (Signed)
Rehab Admissions Coordinator Note:  Patient was screened by Retta Diones for appropriateness for an Inpatient Acute Rehab Consult.  At this time, we are recommending Inpatient Rehab consult.  Jodell Cipro M 03/30/2018, 3:13 PM  I can be reached at [phone].

## 2018-03-31 NOTE — Progress Notes (Signed)
Physical Therapy Treatment Patient Details Name: Jillian Eaton MRN: [id number] DOB: Jul 26, 1950 Today's Date: 03/31/2018    History of Present Illness This 68 y.o. female admitted for Rt reverse TSA due to Rt shoulder rotator cuff arthropathy.  PMH includes: Anemia of chronic disease, pulmonary HTH, morbid obesity, essential HTN, h/o seizures, A-Fib, chronic pain syndrome, acute diastolic HF,     PT Comments    Patient seen for mobility progression. Pt is able to stand from EOB using back of recliner and take a few side steps at EOB using hemi walker. Pt does require assistance in standing as well as single UE support given that she relies on bilat UE for ambulation at baseline. Pt fatigues quickly and was drowsy during session. Pt reports not sleeping much last night due to pain level. Current plan remains appropriate.     Follow Up Recommendations  CIR     Equipment Recommendations  Other (comment)(hemi walker?)    Recommendations for Other Services Rehab consult     Precautions / Restrictions Precautions Precautions: Fall;Shoulder Type of Shoulder Precautions: conservative.  No ROM of shoulder  Shoulder Interventions: At all times;Off for dressing/bathing/exercises Precaution Booklet Issued: Yes (comment) Required Braces or Orthoses: Sling Splint/Cast - Date Prophylactic Dressing Applied (if applicable): 27/06/23 Restrictions Weight Bearing Restrictions: Yes RUE Weight Bearing: Non weight bearing    Mobility  Bed Mobility Overal bed mobility: Needs Assistance Bed Mobility: Supine to Sit;Sit to Supine     Supine to sit: Min guard;HOB elevated Sit to supine: +2 for physical assistance;Mod assist   General bed mobility comments: cues to maintain R UE NWB and then for technique/sequencing to return to supine; assist with lowering trunk to protect R shoulder and to bring bilat LE inot bed  Transfers Overall transfer level: Needs assistance Equipment used:  (recliner turned backward for leverage) Transfers: Sit to/from Stand Sit to Stand: +2 safety/equipment;Mod assist         General transfer comment: assist to power up into standing; pt used back of recliner to pull self up   Ambulation/Gait Ambulation/Gait assistance: Min assist;+2 safety/equipment   Assistive device: Hemi-walker Gait Pattern/deviations: Step-to pattern     General Gait Details: pt took side steps toward HOB using hemi walker; mulltimodal cues for posture   Stairs             Wheelchair Mobility    Modified Rankin (Stroke Patients Only)       Balance Overall balance assessment: Needs assistance Sitting-balance support: Feet supported Sitting balance-Leahy Scale: Fair     Standing balance support: Single extremity supported Standing balance-Leahy Scale: Poor Standing balance comment: needs external support in standing. heavy reliance on UEs for gait on RW at baseline.                             Cognition Arousal/Alertness: Awake/alert Behavior During Therapy: WFL for tasks assessed/performed Overall Cognitive Status: Within Functional Limits for tasks assessed                                 General Comments: pt was drowsy during session and reports that she did not sleep much last night due to pain      Exercises      General Comments        Pertinent Vitals/Pain Pain Assessment: Faces Faces Pain Scale: Hurts little more Pain Location: Rt shoulder  Pain Descriptors / Indicators: Operative site guarding Pain Intervention(s): Limited activity within patient's tolerance;Monitored during session;Premedicated before session;Repositioned    Home Living                      Prior Function            PT Goals (current goals can now be found in the care plan section) Acute Rehab PT Goals Patient Stated Goal: to be able to take care of self  Progress towards PT goals: Progressing toward goals     Frequency    Min 3X/week      PT Plan Current plan remains appropriate    Co-evaluation              AM-PAC PT "6 Clicks" Mobility   Outcome Measure  Help needed turning from your back to your side while in a flat bed without using bedrails?: A Little Help needed moving from lying on your back to sitting on the side of a flat bed without using bedrails?: A Little Help needed moving to and from a bed to a chair (including a wheelchair)?: A Lot Help needed standing up from a chair using your arms (e.g., wheelchair or bedside chair)?: A Lot Help needed to walk in hospital room?: A Lot Help needed climbing 3-5 steps with a railing? : Total 6 Click Score: 13    End of Session Equipment Utilized During Treatment: Other (comment);Gait belt(R UE sling) Activity Tolerance: Patient tolerated treatment well Patient left: in bed;with call bell/phone within reach Nurse Communication: Mobility status PT Visit Diagnosis: Unsteadiness on feet (R26.81);Muscle weakness (generalized) (M62.81);Difficulty in walking, not elsewhere classified (R26.2);Pain Pain - Right/Left: Right Pain - part of body: Shoulder     Time: 6256-3893 PT Time Calculation (min) (ACUTE ONLY): 31 min  Charges:  $Gait Training: 23-37 mins                     Earney Navy, PTA Acute Rehabilitation Services Pager: [phone] Office: [phone]     Darliss Cheney 03/31/2018, 11:39 AM

## 2018-03-31 NOTE — Progress Notes (Signed)
Subjective: 2 Days Post-Op Procedure(s) (LRB): REVERSE SHOULDER ARTHROPLASTY (Right) Patient reports pain as mild.  Well controlled with oral pain meds.  Awaiting rehab placement.  Tolerating regular diet.  Objective: Vital signs in last 24 hours: Temp:  [98.1 F (36.7 C)-98.4 F (36.9 C)] 98.4 F (36.9 C) (02/09 0535) Pulse Rate:  [87-106] 97 (02/09 0535) Resp:  [15-16] 15 (02/09 0535) BP: (90-107)/(56-83) 107/83 (02/09 0535) SpO2:  [92 %-97 %] 97 % (02/09 0535)  Intake/Output from previous day: 02/08 0701 - 02/09 0700 In: 660 [P.O.:660] Out: 625 [Urine:625] Intake/Output this shift: No intake/output data recorded.  Recent Labs    03/30/18 0401  HGB 9.8*   Recent Labs    03/30/18 0401  HCT 32.7*   Recent Labs    03/29/18 0645 03/30/18 0401  NA 140 139  K 4.5 5.0  CL 101 98  CO2 27 30  BUN 20 15  CREATININE 0.99 0.90  GLUCOSE 88 113*  CALCIUM 7.7* 6.9*   Recent Labs    03/29/18 0645  INR 1.32    PE:  wn wd woman in nad.  R shoulder dressed and dry.  Intact motor and sensory fxn at the radial, median and ulnar n dist.   Assessment/Plan: 2 Days Post-Op Procedure(s) (LRB): REVERSE SHOULDER ARTHROPLASTY (Right) Continue therapy.  Pending discharge to rehab tomorrow.      Wylene Simmer 03/31/2018, 10:00 AM

## 2018-04-01 ENCOUNTER — Encounter (HOSPITAL_COMMUNITY): Payer: Self-pay | Admitting: Orthopedic Surgery

## 2018-04-01 DIAGNOSIS — R739 Hyperglycemia, unspecified: Secondary | ICD-10-CM

## 2018-04-01 DIAGNOSIS — T380X5A Adverse effect of glucocorticoids and synthetic analogues, initial encounter: Secondary | ICD-10-CM

## 2018-04-01 DIAGNOSIS — E669 Obesity, unspecified: Secondary | ICD-10-CM

## 2018-04-01 DIAGNOSIS — I272 Pulmonary hypertension, unspecified: Secondary | ICD-10-CM

## 2018-04-01 DIAGNOSIS — Z96611 Presence of right artificial shoulder joint: Secondary | ICD-10-CM

## 2018-04-01 DIAGNOSIS — I4891 Unspecified atrial fibrillation: Secondary | ICD-10-CM

## 2018-04-01 DIAGNOSIS — D62 Acute posthemorrhagic anemia: Secondary | ICD-10-CM

## 2018-04-01 DIAGNOSIS — I10 Essential (primary) hypertension: Secondary | ICD-10-CM

## 2018-04-01 DIAGNOSIS — M159 Polyosteoarthritis, unspecified: Secondary | ICD-10-CM

## 2018-04-01 NOTE — Progress Notes (Signed)
Per Veverly Fells, MD this RN attempted to contact SW regarding FL2. Unsuccessful at this time. Will continue to monitor.

## 2018-04-01 NOTE — Plan of Care (Signed)
  Problem: Activity: Goal: Ability to tolerate increased activity will improve Outcome: Progressing   

## 2018-04-01 NOTE — Progress Notes (Signed)
Inpatient Rehabilitation-Admissions Coordinator   Met with patient at the bedside as follow up from PM&R consult. AC discussed recommendations for rehab venue. Pt is aware that they are not a candidate for CIR at this time. AC has contacted SW regarding recommendation. CIR will sign off at this time. Please call for questions.   Jhonnie Garner, OTR/L  Rehab Admissions Coordinator  [phone] 04/01/2018 5:55 PM'

## 2018-04-01 NOTE — Progress Notes (Signed)
Occupational Therapy Treatment Patient Details Name: Jillian Eaton [facility] MRN: [id number] DOB: Jul 31, 1950 Today's Date: 04/01/2018    History of present illness This 68 y.o. female admitted for Rt reverse TSA due to Rt shoulder rotator cuff arthropathy.  PMH includes: Anemia of chronic disease, pulmonary HTH, morbid obesity, essential HTN, h/o seizures, A-Fib, chronic pain syndrome, acute diastolic HF,    OT comments  Pt making progress towards OT goals this session, able to progress OOB with mod +2 stand pivot transfer with hemi-walker. Pt demonstrating deficits in short term memory, safety, problem solving. Pt required cues for NWB through RUE, max A for sling adjustment, required complete re-education with conservative shoulder protocol and HEP (elbow, wrist, and hand) with handout. DC updated to SNF. OT will continue to follow acutely.    Follow Up Recommendations  SNF;Supervision/Assistance - 24 hour    Equipment Recommendations  Other (comment)(defer to the next venue of care)    Recommendations for Other Services      Precautions / Restrictions Precautions Precautions: Fall;Shoulder Type of Shoulder Precautions: conservative.  No ROM of shoulder  Shoulder Interventions: At all times;Off for dressing/bathing/exercises Precaution Booklet Issued: Yes (comment) Precaution Comments: shoulder hand out provided  Required Braces or Orthoses: Sling Restrictions Weight Bearing Restrictions: Yes RUE Weight Bearing: Non weight bearing       Mobility Bed Mobility Overal bed mobility: Needs Assistance Bed Mobility: Supine to Sit     Supine to sit: Min guard;HOB elevated     General bed mobility comments: Pt able to scoot hips and utilize grab bars to come EOB without assist from therapist  Transfers Overall transfer level: Needs assistance Equipment used: 2 person hand held assist;Hemi-walker Transfers: Sit to/from [facility] Sit to Stand: Mod assist;+2  physical assistance;+2 safety/equipment;From elevated surface Stand pivot transfers: Mod assist;+2 physical assistance;+2 safety/equipment       General transfer comment: mod A +2 for power up, once upright remains in flexed posture despite cues. Pt able to take small pivotal steps - bed had to be moved and recliner moved in closer for safety due to fatigue    Balance Overall balance assessment: Needs assistance Sitting-balance support: Feet supported Sitting balance-Leahy Scale: Fair     Standing balance support: Single extremity supported Standing balance-Leahy Scale: Poor Standing balance comment: needs external support in standing. heavy reliance on UEs for gait on RW at baseline.                            ADL either performed or assessed with clinical judgement   ADL Overall ADL's : Needs assistance/impaired                 Upper Body Dressing : Total assistance;Sitting Upper Body Dressing Details (indicate cue type and reason): to adjust sling and gown Lower Body Dressing: Total assistance;Bed level;Sitting/lateral leans Lower Body Dressing Details (indicate cue type and reason): for socks Toilet Transfer: +2 for physical assistance;+2 for safety/equipment;Moderate assistance;Stand-pivot;Requires wide/bariatric(hemi walker) Toilet Transfer Details (indicate cue type and reason): flexed position throughout transfers, despite cues couldn't/wouldn't come to full upright Toileting- Clothing Manipulation and Hygiene: Moderate assistance       Functional mobility during ADLs: Maximal assistance;+2 for physical assistance(hemi walker) General ADL Comments: Pt with zero recall of compensatory strategies, or precautions     Vision       Perception     Praxis      Cognition Arousal/Alertness: Awake/alert Behavior During Therapy:  WFL for tasks assessed/performed Overall Cognitive Status: Impaired/Different from baseline Area of Impairment:  Memory;Attention;Safety/judgement;Problem solving                   Current Attention Level: Selective Memory: Decreased short-term memory;Decreased recall of precautions   Safety/Judgement: Decreased awareness of safety;Decreased awareness of deficits   Problem Solving: Requires verbal cues;Requires tactile cues General Comments: Pt with NO recall of previous shoulder education or precautions "give me that walker and I'll be able to get to the chair" "Dr. Sabra Heck told me I could push through that arm to help me walk"        Exercises Exercises: Shoulder Shoulder Exercises Elbow Flexion: AROM;Right;10 reps;Seated Elbow Extension: AROM;Right;10 reps;Seated Wrist Flexion: AROM;Right;Seated Wrist Extension: AROM;Right;Seated Digit Composite Flexion: AROM;Right Composite Extension: AROM;Right Neck Flexion: AROM Neck Extension: AROM Neck Lateral Flexion - Right: AROM Neck Lateral Flexion - Left: AROM   Shoulder Instructions Shoulder Instructions Donning/doffing shirt without moving shoulder: Maximal assistance Method for sponge bathing under operated UE: Maximal assistance Donning/doffing sling/immobilizer: Maximal assistance Correct positioning of sling/immobilizer: Maximal assistance ROM for elbow, wrist and digits of operated UE: Supervision/safety(zero recall of instruction) Sling wearing schedule (on at all times/off for ADL's): Maximal assistance     General Comments Pt DOE, O2 had to be increased from 2.5 to 3 with transfer due to saturations dropping to 86%    Pertinent Vitals/ Pain       Pain Assessment: 0-10 Pain Score: 4  Pain Location: Rt shoulder  Pain Descriptors / Indicators: Operative site guarding Pain Intervention(s): Monitored during session(resposition of sling)  Home Living                                          Prior Functioning/Environment              Frequency  Min 2X/week        Progress Toward Goals  OT  Goals(current goals can now be found in the care plan section)  Progress towards OT goals: Progressing toward goals(no recall of precautions, able to perform HEP)  Acute Rehab OT Goals Patient Stated Goal: to be able to take care of self  OT Goal Formulation: With patient Time For Goal Achievement: 04/13/18 Potential to Achieve Goals: Good  Plan Discharge plan needs to be updated    Co-evaluation    PT/OT/SLP Co-Evaluation/Treatment: Yes Reason for Co-Treatment: Necessary to address cognition/behavior during functional activity;For patient/therapist safety;To address functional/ADL transfers PT goals addressed during session: Mobility/safety with mobility;Balance;Proper use of DME OT goals addressed during session: ADL's and self-care;Proper use of Adaptive equipment and DME;Strengthening/ROM      AM-PAC OT "6 Clicks" Daily Activity     Outcome Measure   Help from another person eating meals?: None Help from another person taking care of personal grooming?: A Little Help from another person toileting, which includes using toliet, bedpan, or urinal?: Total Help from another person bathing (including washing, rinsing, drying)?: A Lot Help from another person to put on and taking off regular upper body clothing?: A Lot Help from another person to put on and taking off regular lower body clothing?: Total 6 Click Score: 13    End of Session Equipment Utilized During Treatment: Gait belt;Other (comment)(sling, hemi-walker)  OT Visit Diagnosis: Unsteadiness on feet (R26.81);Other abnormalities of gait and mobility (R26.89);History of falling (Z91.81);Muscle weakness (generalized) (M62.81);Other symptoms and signs involving cognitive  function;Pain Pain - Right/Left: Right Pain - part of body: Shoulder   Activity Tolerance Patient tolerated treatment well   Patient Left in chair;with call bell/phone within reach;with nursing/sitter in room   Nurse Communication Mobility status;Other  (comment)(Pt requests purewick)        Time: 8325-4982 OT Time Calculation (min): 33 min  Charges: OT General Charges $OT Visit: 1 Visit OT Treatments $Self Care/Home Management : 8-22 mins  Hulda Humphrey OTR/L Acute Rehabilitation Services Pager: [phone] Office: [place] 04/01/2018, 11:42 AM

## 2018-04-01 NOTE — Progress Notes (Signed)
Physical Therapy Treatment Patient Details Name: Jillian Eaton MRN: [id number] DOB: [birth year] Today's Date: 04/01/2018    History of Present Illness This 68 y.o. female admitted for Rt reverse TSA due to Rt shoulder rotator cuff arthropathy.  PMH includes: Anemia of chronic disease, pulmonary HTH, morbid obesity, essential HTN, h/o seizures, A-Fib, chronic pain syndrome, acute diastolic HF,     PT Comments    Patient received in bed, agrees to PT session. Treatment performed with OT. Patient requires min guard/supervision for supine to sit. Modified independent with use of bed rails, increased time and effort to perform. Patient able to stand with mod assist +2 for elevated surface. Requires cues for upright posture and use of HW on left. Patient asking for walker and requires education about NWB status on right UE. Patient able to take a few steps with hemiwalker and mod assist +2. Decreased step length and foot clearance bilaterally. Patient will benefit from skilled PT to address her weakness, and difficulty walking to return to prior functional level.        Follow Up Recommendations  SNF     Equipment Recommendations  Other (comment)    Recommendations for Other Services       Precautions / Restrictions Precautions Precautions: Fall;Shoulder Type of Shoulder Precautions: conservative.  No ROM of shoulder  Shoulder Interventions: At all times;Off for dressing/bathing/exercises Precaution Booklet Issued: Yes (comment) Precaution Comments: shoulder hand out provided  Required Braces or Orthoses: Sling Restrictions Weight Bearing Restrictions: Yes RUE Weight Bearing: Non weight bearing    Mobility  Bed Mobility Overal bed mobility: Needs Assistance Bed Mobility: Supine to Sit     Supine to sit: Min guard;HOB elevated Sit to supine: +2 for physical assistance;Mod assist   General bed mobility comments: Pt able to scoot hips and utilize grab bars to come EOB  without assist from therapist  Transfers Overall transfer level: Needs assistance Equipment used: Hemi-walker;2 person hand held assist Transfers: Sit to/from Stand;Stand Pivot Transfers Sit to Stand: +2 physical assistance;+2 safety/equipment;From elevated surface;Mod assist Stand pivot transfers: Mod assist;+2 physical assistance;+2 safety/equipment       General transfer comment: mod A +2 for power up, once upright remains in flexed posture despite cues. Pt able to take small pivotal steps - bed had to be moved and recliner moved in closer for safety due to fatigue  Ambulation/Gait Ambulation/Gait assistance: Min assist;+2 safety/equipment Gait Distance (Feet): 3 Feet Assistive device: Hemi-walker;2 person hand held assist Gait Pattern/deviations: Step-to pattern;Decreased step length - right;Decreased step length - left;Shuffle;Trunk flexed;Wide base of support Gait velocity: decreased   General Gait Details: requires cues for upright posture, NWB on right ( patient requesting to use RW)   Stairs             Wheelchair Mobility    Modified Rankin (Stroke Patients Only)       Balance Overall balance assessment: Needs assistance Sitting-balance support: Feet supported Sitting balance-Leahy Scale: Fair     Standing balance support: Single extremity supported;During functional activity Standing balance-Leahy Scale: Poor Standing balance comment: needs external support in standing. heavy reliance on UEs for gait on RW at baseline.                             Cognition Arousal/Alertness: Awake/alert Behavior During Therapy: WFL for tasks assessed/performed Overall Cognitive Status: Impaired/Different from baseline Area of Impairment: Memory;Attention;Safety/judgement;Problem solving  Current Attention Level: Selective Memory: Decreased short-term memory;Decreased recall of precautions   Safety/Judgement: Decreased awareness  of safety;Decreased awareness of deficits   Problem Solving: Requires verbal cues;Requires tactile cues General Comments: Pt with NO recall of previous shoulder education or precautions "give me that walker and I'll be able to get to the chair" "Dr. Hyacinth Meeker told me I could push through that arm to help me walk"      Exercises Shoulder Exercises Elbow Flexion: AROM;Right;10 reps;Seated Elbow Extension: AROM;Right;10 reps;Seated Wrist Flexion: AROM;Right;Seated Wrist Extension: AROM;Right;Seated Digit Composite Flexion: AROM;Right Composite Extension: AROM;Right Neck Flexion: AROM Neck Extension: AROM Neck Lateral Flexion - Right: AROM Neck Lateral Flexion - Left: AROM Donning/doffing shirt without moving shoulder: Maximal assistance Method for sponge bathing under operated UE: Maximal assistance Donning/doffing sling/immobilizer: Maximal assistance Correct positioning of sling/immobilizer: Maximal assistance ROM for elbow, wrist and digits of operated UE: Supervision/safety(zero recall of instruction) Sling wearing schedule (on at all times/off for ADL's): Maximal assistance    General Comments General comments (skin integrity, edema, etc.): Pt DOE, O2 had to be increased from 2.5 to 3 with transfer due to saturations dropping to 86%      Pertinent Vitals/Pain Pain Assessment: 0-10 Pain Score: 4  Pain Location: Rt shoulder  Pain Descriptors / Indicators: Operative site guarding;Aching;Discomfort Pain Intervention(s): Monitored during session;Repositioned    Home Living                      Prior Function            PT Goals (current goals can now be found in the care plan section) Acute Rehab PT Goals Patient Stated Goal: to be able to take care of self  PT Goal Formulation: With patient Time For Goal Achievement: 04/13/18 Potential to Achieve Goals: Good Progress towards PT goals: Progressing toward goals    Frequency    Min 3X/week      PT Plan  Discharge plan needs to be updated    Co-evaluation PT/OT/SLP Co-Evaluation/Treatment: Yes Reason for Co-Treatment: For patient/therapist safety;To address functional/ADL transfers PT goals addressed during session: Mobility/safety with mobility;Proper use of DME;Balance OT goals addressed during session: Proper use of Adaptive equipment and DME;Strengthening/ROM;ADL's and self-care      AM-PAC PT "6 Clicks" Mobility   Outcome Measure  Help needed turning from your back to your side while in a flat bed without using bedrails?: A Little Help needed moving from lying on your back to sitting on the side of a flat bed without using bedrails?: A Little Help needed moving to and from a bed to a chair (including a wheelchair)?: A Lot Help needed standing up from a chair using your arms (e.g., wheelchair or bedside chair)?: A Lot Help needed to walk in hospital room?: A Lot Help needed climbing 3-5 steps with a railing? : Total 6 Click Score: 13    End of Session Equipment Utilized During Treatment: Gait belt;Other (comment)(R UE sling) Activity Tolerance: Patient limited by fatigue;Other (comment)(limited by inability to use R UE due to surgery) Patient left: in chair;with call bell/phone within reach Nurse Communication: Mobility status PT Visit Diagnosis: Muscle weakness (generalized) (M62.81);Difficulty in walking, not elsewhere classified (R26.2);Pain;Unsteadiness on feet (R26.81) Pain - Right/Left: Right Pain - part of body: Shoulder     Time: 4098-1191 PT Time Calculation (min) (ACUTE ONLY): 25 min  Charges:  $Gait Training: 8-22 mins $Therapeutic Activity: 8-22 mins  Danton Palmateer, PT, GCS 04/01/18,12:32 PM

## 2018-04-01 NOTE — Consult Note (Signed)
Physical Medicine and Rehabilitation Consult   Reason for Consult: Functional deficits due to right total shoulder.   Referring Physician: Dr. Veverly Fells   HPI: Jillian Eaton is a 68 y.o. female with history of A fib, chronic pain, seizure disorder? 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 She was admitted on 03/29/18 for reverse right total shoulder by Dr. Veverly Fells. To be NWB RLE. Therapy evaluations completed this weekend and CIR recommended due to functional deficits.  Patient sitting up in a chair now.  Hospital course complicated by steroid-induced hyperglycemia, acute blood loss anemia.  She is associated weakness in her right upper extremity.  Husband can provide?  Limited assistance at discharge.  Review of Systems  Constitutional: Negative for chills and fever.  HENT: Negative for hearing loss and tinnitus.   Eyes: Negative for blurred vision and double vision.  Respiratory: Positive for shortness of breath.        Reports DOE has resolved.    Cardiovascular: Positive for leg swelling. Negative for chest pain and palpitations.  Gastrointestinal: Negative for constipation, heartburn and nausea.  Genitourinary: Negative for dysuria and urgency.  Musculoskeletal: Positive for back pain, joint pain (bilateral knees and left shoulder. ) and myalgias.  Neurological: Positive for weakness (reports has difficulty raising her legs at baseline due to weak  knees. ). Negative for dizziness and headaches.  All other systems reviewed and are negative.   Past Medical History:  Diagnosis Date  . Anemia   . Asthma    flare up last yr  . CHF (congestive heart failure) ([place])   . Chronic headache   . DOE (dyspnea on exertion)    2D ECHO, 02/12/2012 - EF 60-65%, moderate concentric hypertrophy  . Fibromyalgia      nerve pain"left side at waist level" "can't lay on that side without pain" , "HOB elevation helps"  . Heart murmur   . Hematuria - cause not known   . History of kidney stones    x 2 '13, '14 surgery to remove  . Hypertension   . SBO (small bowel obstruction) ([place]) 06/07/2013  . Sleep apnea    tested several times and came up negative  . Thyroid disease    "goiter"  . Transfusion history    10 yrs+    Past Surgical History:  Procedure Laterality Date  . CARDIAC CATHETERIZATION  04/04/2010   No significant obstructive coronary artery disease  . CARDIOVERSION N/A 08/08/2017   Procedure: CARDIOVERSION;  Surgeon: Pixie Casino, MD;  Location: [facility];  Service: Cardiovascular;  Laterality: N/A;  . CHOLECYSTECTOMY  1990  . COLONOSCOPY W/ POLYPECTOMY    . COLONOSCOPY WITH PROPOFOL N/A 04/10/2014   Procedure: COLONOSCOPY WITH PROPOFOL;  Surgeon: Beryle Beams, MD;  Location: [facility];  Service: Endoscopy;  Laterality: N/A;  . DIAGNOSTIC LAPAROSCOPY     x2 bowel obstructions(adhesions)  . EYE SURGERY     lasik 20-25 yrs ago  . 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 THYROIDECTOMY    . TUBAL LIGATION  1986    Family History  Problem Relation Age of Onset  . Diabetes Mother   . Epilepsy Mother   . Cancer Mother        Breast  . Hypertension Mother   .  Breast cancer Mother   . Kidney disease Father   . Diabetes Father   . Hypertension Father   . Asthma Father   . Heart disease Father   . Epilepsy Sister   . Cancer Maternal Grandmother   . Breast cancer Maternal Grandmother   . Cancer Paternal Grandmother   . Breast cancer Paternal Grandmother     Social History:  Married. Retired. Has been at multiple rehab centers the past couple of years due to medical issues. She reports that she has never smoked. She has never used smokeless tobacco. She reports that she does not drink alcohol or use drugs.    Allergies  Allergen  Reactions  . Norco [Hydrocodone-Acetaminophen] Itching    Medications Prior to Admission  Medication Sig Dispense Refill  . acetaminophen (TYLENOL) 650 MG CR tablet Take 1,300 mg by mouth 2 (two) times daily.     Marland Kitchen apixaban (ELIQUIS) 5 MG TABS tablet Take 1 tablet (5 mg total) by mouth 2 (two) times daily. 180 tablet 3  . calcitRIOL (ROCALTROL) 0.5 MCG capsule TAKE 1 CAPSULE BY MOUTH 2 TIMES DAILY. (Patient taking differently: Take 0.5 mcg by mouth 2 (two) times daily. ) 180 capsule 0  . calcium carbonate (OS-CAL - DOSED IN MG OF ELEMENTAL CALCIUM) 1250 (500 Ca) MG tablet Take 1 tablet (1,250 mg total) by mouth 3 (three) times daily with meals. (Patient taking differently: Take 2 tablets by mouth 2 (two) times daily. ) 90 tablet 0  . carvedilol (COREG) 3.125 MG tablet Take 1 tablet (3.125 mg total) by mouth 2 (two) times daily with a meal. 60 tablet 0  . cyanocobalamin (,VITAMIN B-12,) 1000 MCG/ML injection Inject 1,000 mcg into the muscle every 30 (thirty) days.    . cyclobenzaprine (FLEXERIL) 10 MG tablet Take 20 mg by mouth 2 (two) times daily.   0  . diclofenac sodium (VOLTAREN) 1 % GEL Apply 1 application topically 2 (two) times daily as needed (for knee pain).    . DULoxetine (CYMBALTA) 60 MG capsule Take 1 capsule (60 mg total) by mouth daily. 30 capsule 0  . eszopiclone (LUNESTA) 2 MG TABS tablet Take 2 mg by mouth at bedtime.  1  . furosemide (LASIX) 20 MG tablet Take 20 mg by mouth daily.    Marland Kitchen levothyroxine (SYNTHROID, LEVOTHROID) 200 MCG tablet Take 200 mcg by mouth daily before breakfast.    . LYRICA 200 MG capsule Take 200 mg by mouth 2 (two) times daily.   2  . magnesium oxide (MAG-OX) 400 MG tablet Take 400 mg by mouth daily.  0  . mirabegron ER (MYRBETRIQ) 50 MG TB24 tablet Take 50 mg by mouth daily.    Marland Kitchen senna (SENOKOT) 8.6 MG TABS tablet Take 1 tablet (8.6 mg total) by mouth daily. 120 each 0  . traMADol (ULTRAM) 50 MG tablet Take 50 mg by mouth every 6 (six) hours as needed  for pain.  0  . traZODone (DESYREL) 50 MG tablet Take 50 mg by mouth at bedtime.   0  . Vitamin D, Ergocalciferol, (DRISDOL) 50000 units CAPS capsule Take 50,000 Units by mouth every Wednesday.       Home: Home Living Family/patient expects to be discharged to:: Private residence Living Arrangements: Spouse/significant other Available Help at Discharge: Family, Available 24 hours/day Type of Home: House Home Access: Level entry [place]: One level Bathroom Shower/Tub: Multimedia programmer: Handicapped height Bathroom Accessibility: Yes Home Equipment: Environmental consultant - 2 wheels, Adaptive equipment, Grab  bars - toilet, Grab bars - tub/shower Adaptive Equipment: Reacher, Sock aid, Long-handled shoe horn, Long-handled sponge Additional Comments: spouse has some health issues and is unable to do heavy lifting   Functional History: Prior Function Level of [place]: Independent with assistive device(s) Comments: Pt reports she was performing ADLs mod I, and ambulated with RW  Functional Status:  Mobility: Bed Mobility Overal bed mobility: Needs Assistance Bed Mobility: Supine to Sit, Sit to Supine Supine to sit: Min guard, HOB elevated Sit to supine: +2 for physical assistance, Mod assist General bed mobility comments: cues to maintain R UE NWB and then for technique/sequencing to return to supine; assist with lowering trunk to protect R shoulder and to bring bilat LE inot bed Transfers Overall transfer level: Needs assistance Equipment used: (recliner turned backward for leverage) Transfers: Sit to/from Stand Sit to Stand: +2 safety/equipment, Mod assist General transfer comment: assist to power up into standing; pt used back of recliner to pull self up  Ambulation/Gait Ambulation/Gait assistance: Min assist, +2 safety/equipment Gait Distance (Feet): 3 Feet Assistive device: Hemi-walker Gait Pattern/deviations: Step-to pattern General Gait Details: pt took side steps  toward [facility] using hemi walker; mulltimodal cues for posture    ADL: ADL Overall ADL's : Needs assistance/impaired Eating/Feeding: Set up, Sitting Grooming: Wash/dry hands, Wash/dry face, Oral care, Set up, Sitting Upper Body Bathing: Sitting, Moderate assistance Lower Body Bathing: Maximal assistance, Sitting/lateral leans, Bed level Upper Body Dressing : Moderate assistance, Sitting Lower Body Dressing: Total assistance, Bed level, Sitting/lateral leans Toilet Transfer: Total assistance Toilet Transfer Details (indicate cue type and reason): unable  Toileting- Clothing Manipulation and Hygiene: Total assistance, Bed level Functional mobility during ADLs: Maximal assistance, +2 for physical assistance  Cognition: Cognition Overall Cognitive Status: Within Functional Limits for tasks assessed Orientation Level: Oriented X4 Cognition Arousal/Alertness: Awake/alert Behavior During Therapy: WFL for tasks assessed/performed Overall Cognitive Status: Within Functional Limits for tasks assessed General Comments: pt was drowsy during session and reports that she did not sleep much last night due to pain   Blood pressure (!) 103/52, pulse (!) 103, temperature 98.3 F (36.8 C), temperature source Oral, resp. rate 18, height 5\' 5"  (1.651 m), weight (!) 137.9 kg, SpO2 100 %. Physical Exam  Nursing note and vitals reviewed. Constitutional: She is oriented to person, place, and time. She appears well-developed. Nasal cannula in place.  Morbidly obese.  HENT:  Head: Normocephalic and atraumatic.  Eyes: EOM are normal. Right eye exhibits no discharge. Left eye exhibits no discharge.  Neck: Normal range of motion. Neck supple.  Cardiovascular:  Irregularly irregular  Respiratory: Effort normal and breath sounds normal.  + [place]  GI: Soft. Bowel sounds are normal.  Musculoskeletal:     Comments: Left lower extremity tenderness Bilateral lower extremity edema  Neurological: She is alert and  oriented to person, place, and time.  Able to answer orientation questions.  Motor: Right upper extremity: Limited by sling proximally, handgrip 4/5 Left upper extremity: 5/5 proximal distal Bilateral lower extremities: Hip flexion 3+/5, knee extension 4/5, ankle dorsiflexion 5/5  Skin: Skin is warm and dry.  ?  Cellulitis left lower extremity  Psychiatric: Her speech is normal. Her mood appears anxious. She is agitated.    No results found for this or any previous visit (from the past 24 hour(s)). No results found.  Assessment/Plan: Diagnosis: Debility Labs independently reviewed.  Records reviewed and summated above.  1. Does the need for close, 24 hr/day medical supervision in concert with the patient's rehab  needs make it unreasonable for this patient to be served in a less intensive setting? Potentially  2. Co-Morbidities requiring supervision/potential complications: steroid-induced hyperglycemia (Monitor in accordance with exercise and adjust meds as necessary), acute blood loss anemia (repeat labs, transfuse to ensure appropriate perfusion for increased activity tolerance), A fib (continue meds, monitor heart rate with increased physical activity), chronic pain, seizure disorder?, HTN (monitor and provide prns in accordance with increased physical exertion and pain), OA bilateral knees and left shoulder, pulmonary HTN (oxygen at nights?, Monitor in accordance with increased physical activity and avoid UE resistance excercises), failed gastric bypass X 2, super morbid obesity- BMI 50, 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 Due to safety, skin/wound care, disease management, pain management and patient education, does the patient require 24 hr/day rehab nursing? Yes 4. Does the patient require coordinated care of a physician, rehab nurse, PT (1-2 hrs/day, 5 days/week) and OT (1-2 hrs/day, 5 days/week) to address physical and functional deficits in the context of the above medical  diagnosis(es)? Potentially Addressing deficits in the following areas: balance, endurance, locomotion, strength, transferring, bathing, dressing, feeding, grooming, toileting and psychosocial support 5. Can the patient actively participate in an intensive therapy program of at least 3 hrs of therapy per day at least 5 days per week? Potentially 6. The potential for patient to make measurable gains while on inpatient rehab is excellent and good 7. Anticipated functional outcomes upon discharge from inpatient rehab are min assist  with PT, min assist and mod assist with OT, n/a with SLP. 8. Estimated rehab length of stay to reach the above functional goals is: 15-19 days. 9. Anticipated D/C setting: Other 10. Anticipated post D/C treatments: SNF 11. Overall Rehab/Functional Prognosis: good  RECOMMENDATIONS: This patient's condition is appropriate for continued rehabilitative care in the following setting: Agree with PT/OT, SNF for prolonged rehab course Patient has agreed to participate in recommended program. Potentially Note that insurance prior authorization may be required for reimbursement for recommended care.  Comment: Rehab Admissions Coordinator to follow up.   I have personally performed a face to face diagnostic evaluation, including, but not limited to relevant history and physical exam findings, of this patient and developed relevant assessment and plan.  Additionally, I have reviewed and concur with the physician assistant's documentation above.   Delice Lesch, MD, ABPMR Jillian Leriche, PA-C 04/01/2018

## 2018-04-01 NOTE — Progress Notes (Signed)
Pt refused BP to be taken because pt stated "it hurts too much". Per Veverly Fells, MD coreg not given. Will recheck BP in am.

## 2018-04-01 NOTE — Progress Notes (Signed)
   Subjective: 3 Days Post-Op Procedure(s) (LRB): REVERSE SHOULDER ARTHROPLASTY (Right)  Recheck right shoulder s/p reverse total C/o moderate soreness in the shoulder Pain with rom of the right upper extremity Recommendation for inpatient rehab by therapy Patient reports pain as moderate.  Objective:   VITALS:   Vitals:   04/01/18 0532 04/01/18 0841  BP: 92/64 (!) 103/52  Pulse: 100 (!) 103  Resp:    Temp: 98.3 F (36.8 C)   SpO2: 100%     Right shoulder incision healing well No erythema or drainage nv intact distally No rashes or edema distally  LABS Recent Labs    03/30/18 0401  HGB 9.8*  HCT 32.7*    Recent Labs    03/30/18 0401  NA 139  K 5.0  BUN 15  CREATININE 0.90  GLUCOSE 113*     Assessment/Plan: 3 Days Post-Op Procedure(s) (LRB): REVERSE SHOULDER ARTHROPLASTY (Right) Will await CIR evaluation later today Pt stable for transfer once bed available Continue PT/OT Pain management as needed Discussed plan with pt and son     Merla Riches PA-C, [place] [facility] [employer]  [address] [place], [place], [place] [address] Phone: [phone] [email or web address] Facebook  [employer]

## 2018-04-02 ENCOUNTER — Other Ambulatory Visit: Payer: Self-pay | Admitting: Internal Medicine

## 2018-04-02 DIAGNOSIS — R2681 Unsteadiness on feet: Secondary | ICD-10-CM | POA: Diagnosis not present

## 2018-04-02 DIAGNOSIS — G4701 Insomnia due to medical condition: Secondary | ICD-10-CM | POA: Diagnosis not present

## 2018-04-02 DIAGNOSIS — M797 Fibromyalgia: Secondary | ICD-10-CM | POA: Diagnosis not present

## 2018-04-02 DIAGNOSIS — M1990 Unspecified osteoarthritis, unspecified site: Secondary | ICD-10-CM | POA: Diagnosis not present

## 2018-04-02 DIAGNOSIS — M12811 Other specific arthropathies, not elsewhere classified, right shoulder: Secondary | ICD-10-CM | POA: Diagnosis not present

## 2018-04-02 DIAGNOSIS — N189 Chronic kidney disease, unspecified: Secondary | ICD-10-CM | POA: Diagnosis not present

## 2018-04-02 DIAGNOSIS — M2550 Pain in unspecified joint: Secondary | ICD-10-CM | POA: Diagnosis not present

## 2018-04-02 DIAGNOSIS — R41841 Cognitive communication deficit: Secondary | ICD-10-CM | POA: Diagnosis not present

## 2018-04-02 DIAGNOSIS — Z7401 Bed confinement status: Secondary | ICD-10-CM | POA: Diagnosis not present

## 2018-04-02 DIAGNOSIS — M75101 Unspecified rotator cuff tear or rupture of right shoulder, not specified as traumatic: Secondary | ICD-10-CM | POA: Diagnosis not present

## 2018-04-02 DIAGNOSIS — I10 Essential (primary) hypertension: Secondary | ICD-10-CM | POA: Diagnosis not present

## 2018-04-02 DIAGNOSIS — M6281 Muscle weakness (generalized): Secondary | ICD-10-CM | POA: Diagnosis not present

## 2018-04-02 DIAGNOSIS — E890 Postprocedural hypothyroidism: Secondary | ICD-10-CM | POA: Diagnosis not present

## 2018-04-02 DIAGNOSIS — J45909 Unspecified asthma, uncomplicated: Secondary | ICD-10-CM | POA: Diagnosis not present

## 2018-04-02 DIAGNOSIS — I5032 Chronic diastolic (congestive) heart failure: Secondary | ICD-10-CM | POA: Diagnosis not present

## 2018-04-02 DIAGNOSIS — N3281 Overactive bladder: Secondary | ICD-10-CM | POA: Diagnosis not present

## 2018-04-02 DIAGNOSIS — E0500 Thyrotoxicosis with diffuse goiter without thyrotoxic crisis or storm: Secondary | ICD-10-CM | POA: Diagnosis not present

## 2018-04-02 DIAGNOSIS — Z96611 Presence of right artificial shoulder joint: Secondary | ICD-10-CM | POA: Diagnosis not present

## 2018-04-02 DIAGNOSIS — Z471 Aftercare following joint replacement surgery: Secondary | ICD-10-CM | POA: Diagnosis not present

## 2018-04-02 DIAGNOSIS — G4733 Obstructive sleep apnea (adult) (pediatric): Secondary | ICD-10-CM | POA: Diagnosis not present

## 2018-04-02 DIAGNOSIS — M8580 Other specified disorders of bone density and structure, unspecified site: Secondary | ICD-10-CM | POA: Diagnosis not present

## 2018-04-02 DIAGNOSIS — Z9181 History of falling: Secondary | ICD-10-CM | POA: Diagnosis not present

## 2018-04-02 DIAGNOSIS — D62 Acute posthemorrhagic anemia: Secondary | ICD-10-CM | POA: Diagnosis not present

## 2018-04-02 DIAGNOSIS — K219 Gastro-esophageal reflux disease without esophagitis: Secondary | ICD-10-CM | POA: Diagnosis not present

## 2018-04-02 DIAGNOSIS — I129 Hypertensive chronic kidney disease with stage 1 through stage 4 chronic kidney disease, or unspecified chronic kidney disease: Secondary | ICD-10-CM | POA: Diagnosis not present

## 2018-04-02 DIAGNOSIS — D649 Anemia, unspecified: Secondary | ICD-10-CM | POA: Diagnosis not present

## 2018-04-02 MED ORDER — TRAMADOL HCL 50 MG PO TABS: 50.0000 mg | ORAL_TABLET | Freq: Four times a day (QID) | ORAL | 0 refills | Status: DC | PRN

## 2018-04-02 MED ORDER — LYRICA 200 MG PO CAPS: 200.0000 mg | ORAL_CAPSULE | Freq: Two times a day (BID) | ORAL | 0 refills | Status: DC

## 2018-04-02 MED ORDER — ESZOPICLONE 2 MG PO TABS: 2.0000 mg | ORAL_TABLET | Freq: Every day | ORAL | 0 refills | Status: DC

## 2018-04-02 NOTE — Progress Notes (Signed)
Patient will DC to: [facility] Anticipated DC date:04/02/2018 Family notified:patient notifying Transport [id number]  Per MD patient ready for DC to [facility] . RN, patient, patient's family, and facility notified of DC. Discharge Summary sent to facility. RN given number for report [phone]. DC packet on chart. Ambulance transport requested for patient.  CSW signing off.  [place], [place]

## 2018-04-02 NOTE — Clinical Social Work Placement (Signed)
Nurse to call report to [phone]     CLINICAL SOCIAL WORK PLACEMENT  NOTE  Date:  04/02/2018  Patient Details  Name: Jillian Eaton [facility] MRN: [id number] Date of Birth: 07/04/1950  Clinical Social Work is seeking post-discharge placement for this patient at the [place] level of care (*CSW will initial, date and re-position this form in  chart as items are completed):  Yes   Patient/family provided with [place] Work Department's list of facilities offering this level of care within the geographic area requested by the patient (or if unable, by the patient's family).  Yes   Patient/family informed of their freedom to choose among providers that offer the needed level of care, that participate in Medicare, Medicaid or managed care program needed by the patient, have an available bed and are willing to accept the patient.  Yes   Patient/family informed of 's ownership interest in [facility] and [facility], as well as of the fact that they are under no obligation to receive care at these facilities.  PASRR submitted to EDS on       PASRR number received on       Existing PASRR number confirmed on 04/02/18     FL2 transmitted to all facilities in geographic area requested by pt/family on 04/02/18     FL2 transmitted to all facilities within larger geographic area on       Patient informed that his/her managed care company has contracts with or will negotiate with certain facilities, including the following:        Yes   Patient/family informed of bed offers received.  Patient chooses bed at [facility]     Physician recommends and patient chooses bed at      Patient to be transferred to [facility] on 04/02/18.  Patient to be transferred to facility by [facility]     Patient family notified on 04/02/18 of transfer.  Name of family member notified:        PHYSICIAN       Additional  Comment:    _______________________________________________ Geralynn Ochs, LCSW 04/02/2018, 12:06 PM

## 2018-04-02 NOTE — NC FL2 (Signed)
[place] LEVEL OF CARE SCREENING TOOL     IDENTIFICATION  Patient Name: Jillian Eaton [facility] Birthdate: [birth year] Sex: female Admission Date (Current Location): 03/29/2018  [facility] [place] Number:  Herbalist and Address:  [facility] [place], [place] [address] [place], [place] [address]      Provider Number: [id number]  Attending Physician Name and Address:  Netta Cedars, MD  Relative Name and Phone Number:  Hubbard Robinson (husband) [phone]    Current Level of Care: Hospital Recommended Level of Care: [place] Prior Approval Number:    Date Approved/Denied: 05/01/16 PASRR Number: [id number]  Discharge Plan: SNF    Current Diagnoses: Patient Active Problem List   Diagnosis Date Noted  . Steroid-induced hyperglycemia   . Acute blood loss anemia   . Generalized OA   . Super obese   . 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 NICM (nonischemic cardiomyopathy) ([place]) 01/15/2018  . Osteoporosis 12/13/2017  . Chronic diastolic CHF (congestive heart failure) ([place]) 09/28/2017  . 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 Hypercapnemia 07/13/2017  . AKI (acute kidney injury) ([place]) 07/10/2017  . On anticoagulant therapy 06/19/2017  . GERD (gastroesophageal reflux disease) 08/28/2016  . 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 Depression 08/28/2016  . Iatrogenic hypocalcemia 08/28/2016  . Chronic pain syndrome   . Fibromyalgia   . Asthma   . Acute diastolic (congestive) heart failure ([place])   . Atrial fibrillation ([place]) 04/28/2016  . Vocal cord dysfunction 04/28/2016  . Thrombocytopenia ([place]) 04/28/2016  . Seizures ([place])   . Preoperative cardiovascular examination   . Leg swelling 10/19/2015  . Acute on chronic respiratory failure with hypoxia and hypercapnia ([place]) 08/03/2015  . Bilateral leg edema 05/11/2015  . Essential hypertension 05/11/2015  . Pulmonary hypertension ([place]) 01/08/2013   . Anemia of chronic disease 03/28/2011  . Morbid obesity ([place]) 09/22/2010    Orientation RESPIRATION BLADDER Height & Weight     Self, Time, Situation, Place  O2(nasal cannula 3L/min) Incontinent, External catheter Weight: (!) 137.9 kg Height:  5\' 5"  (165.1 cm)  BEHAVIORAL SYMPTOMS/MOOD NEUROLOGICAL BOWEL NUTRITION STATUS      Continent Diet(see discharge summary)  AMBULATORY STATUS COMMUNICATION OF NEEDS Skin   Extensive Assist Verbally Other (Comment)(right shoulder closed surgical incision)                       Personal Care Assistance Level of Assistance  Bathing, Feeding, Dressing, Total care Bathing Assistance: Maximum assistance Feeding assistance: Independent Dressing Assistance: Maximum assistance Total Care Assistance: Maximum assistance   Functional Limitations Info  Sight, Hearing, Speech Sight Info: Adequate Hearing Info: Adequate Speech Info: Adequate    SPECIAL CARE FACTORS FREQUENCY  PT (By licensed PT), OT (By licensed OT)     PT Frequency: min 5x weekly OT Frequency: min 5x weekly            Contractures Contractures Info: Not present    Additional Factors Info  Code Status, Allergies Code Status Info: full Allergies Info: Norco (hydrocodone-acetaminophen)           Current Medications (04/02/2018):  This is the current hospital active medication list Current Facility-Administered Medications  Medication Dose Route Frequency Provider Last Rate Last Dose  . 0.9 %  sodium chloride infusion   Intravenous Continuous Netta Cedars, MD   Stopped at 03/29/18 1647  . acetaminophen (TYLENOL) tablet 650 mg  650 mg Oral Q6H Netta Cedars, MD   650 mg  at 04/02/18 0612  . apixaban (ELIQUIS) tablet 5 mg  5 mg Oral BID Netta Cedars, MD   5 mg at 04/02/18 2707  . bisacodyl (DULCOLAX) suppository 10 mg  10 mg Rectal Daily PRN Netta Cedars, MD      . calcitRIOL (ROCALTROL) capsule 0.5 mcg  0.5 mcg Oral BID Netta Cedars, MD   0.5 mcg at 04/02/18 8675   . calcium carbonate (OS-CAL - dosed in mg of elemental calcium) tablet 1,000 mg of elemental calcium  2 tablet Oral BID Netta Cedars, MD   1,000 mg of elemental calcium at 04/01/18 2128  . carvedilol (COREG) tablet 3.125 mg  3.125 mg Oral BID WC Netta Cedars, MD   3.125 mg at 04/02/18 0904  . [START ON 04/05/2018] cyanocobalamin ((VITAMIN B-12)) injection 1,000 mcg  1,000 mcg Intramuscular Q30 days Netta Cedars, MD      . cyclobenzaprine (FLEXERIL) tablet 20 mg  20 mg Oral BID Netta Cedars, MD   20 mg at 04/02/18 0905  . docusate sodium (COLACE) capsule 100 mg  100 mg Oral BID Netta Cedars, MD   100 mg at 04/02/18 0905  . DULoxetine (CYMBALTA) DR capsule 60 mg  60 mg Oral Daily Netta Cedars, MD   60 mg at 04/02/18 0902  . furosemide (LASIX) tablet 20 mg  20 mg Oral Daily Netta Cedars, MD   20 mg at 04/02/18 4492  . levothyroxine (SYNTHROID, LEVOTHROID) tablet 200 mcg  200 mcg Oral QAC breakfast Netta Cedars, MD   200 mcg at 04/02/18 0100  . magnesium oxide (MAG-OX) tablet 400 mg  400 mg Oral Daily Netta Cedars, MD   400 mg at 04/02/18 7121  . menthol-cetylpyridinium (CEPACOL) lozenge 3 mg  1 lozenge Oral PRN Netta Cedars, MD       Or  . phenol (CHLORASEPTIC) mouth spray 1 spray  1 spray Mouth/Throat PRN Netta Cedars, MD      . methocarbamol (ROBAXIN) tablet 500 mg  500 mg Oral Q6H PRN Netta Cedars, MD       Or  . methocarbamol (ROBAXIN) 500 mg in dextrose 5 % 50 mL IVPB  500 mg Intravenous Q6H PRN Netta Cedars, MD      . metoCLOPramide (REGLAN) tablet 5-10 mg  5-10 mg Oral Q8H PRN Netta Cedars, MD       Or  . metoCLOPramide (REGLAN) injection 5-10 mg  5-10 mg Intravenous Q8H PRN Netta Cedars, MD      . mirabegron ER [facility]) tablet 50 mg  50 mg Oral Daily Netta Cedars, MD   50 mg at 04/02/18 9758  . naloxone [facility]) injection 0.4 mg  0.4 mg Intravenous PRN Netta Cedars, MD   0.4 mg at 03/29/18 2332  . ondansetron (ZOFRAN) tablet 4 mg  4 mg Oral Q6H PRN Netta Cedars, MD        Or  . ondansetron [facility]) injection 4 mg  4 mg Intravenous Q6H PRN Netta Cedars, MD      . oxyCODONE (Oxy IR/ROXICODONE) immediate release tablet 5 mg  5 mg Oral Q4H PRN Edmisten, Kristie L, PA   5 mg at 04/01/18 1748  . polyethylene glycol (MIRALAX / GLYCOLAX) packet 17 g  17 g Oral Daily PRN Netta Cedars, MD      . pregabalin (LYRICA) capsule 200 mg  200 mg Oral BID Netta Cedars, MD   200 mg at 04/02/18 0902  . senna (SENOKOT) tablet 8.6 mg  1 tablet Oral Daily Netta Cedars, MD  8.6 mg at 04/02/18 0907  . sodium chloride flush (NS) 0.9 % injection 10-40 mL  10-40 mL Intracatheter PRN Netta Cedars, MD   10 mL at 04/01/18 0959  . traMADol (ULTRAM) tablet 50-100 mg  50-100 mg Oral Q6H PRN Netta Cedars, MD   100 mg at 04/02/18 5397  . traZODone (DESYREL) tablet 50 mg  50 mg Oral Donavan Burnet, MD   50 mg at 04/01/18 2127  . [START ON 04/03/2018] Vitamin D (Ergocalciferol) (DRISDOL) capsule 50,000 Units  50,000 Units Oral Q Marcell Anger, MD      . zolpidem [facility]) tablet 5 mg  5 mg Oral QHS PRN,MR X 1 Netta Cedars, MD   5 mg at 04/01/18 2127     Discharge Medications: Please see discharge summary for a list of discharge medications.  Relevant Imaging Results:  Relevant Lab Results:   Additional Information SSN: [id number]  Alberteen Sam, LCSW

## 2018-04-02 NOTE — Discharge Summary (Signed)
Orthopedic Discharge Summary        Physician Discharge Summary  Patient ID: Jillian Eaton [facility] MRN: [id number] DOB/AGE: 10/17/1950 68 y.o.  Admit date: 03/29/2018 Discharge date: 04/02/2018   Procedures:  Procedure(s) (LRB): REVERSE SHOULDER ARTHROPLASTY (Right)  Attending Physician:  Dr. Esmond Plants  Admission Diagnoses:   Right shoulder rotator cuff tear arthropathy  Discharge Diagnoses:  same   Past Medical History:  Diagnosis Date  . Anemia   . Asthma    flare up last yr  . CHF (congestive heart failure) ([place])   . Chronic headache   . DOE (dyspnea on exertion)    2D ECHO, 02/12/2012 - EF 60-65%, moderate concentric hypertrophy  . Fibromyalgia    nerve pain"left side at waist level" "can't lay on that side without pain" , "HOB elevation helps"  . Heart murmur   . Hematuria - cause not known   . History of kidney stones    x 2 '13, '14 surgery to remove  . Hypertension   . SBO (small bowel obstruction) ([place]) 06/07/2013  . Sleep apnea    tested several times and came up negative  . Thyroid disease    "goiter"  . Transfusion history    10 yrs+    PCP: [name], MD   Discharged Condition: stable  Hospital Course:  Patient underwent the above stated procedure on 03/29/2018. Patient tolerated the procedure well and brought to the recovery room in good condition and subsequently to the floor. This patient struggled to move and walk due to weakness and balance issues. She is currently a 2 person to 3 person max assist. PT, OT recommending SNF. Patient had an stable hospital course and was stable for discharge.   Disposition: Discharge disposition: 03-Skilled Nursing Facility      with follow up in 2 weeks   Follow-up Information    Netta Cedars, MD. Call in 2 weeks.   Specialty:  Orthopedic Surgery Why:  [phone] Contact information: [address] [place] [address] [phone]           Discharge  Instructions    Call MD / Call 911   Complete by:  As directed    If you experience chest pain or shortness of breath, CALL 911 and be transported to the hospital emergency room.  If you develope a fever above 101 F, pus (white drainage) or increased drainage or redness at the wound, or calf pain, call your surgeon's office.   Constipation Prevention   Complete by:  As directed    Drink plenty of fluids.  Prune juice may be helpful.  You may use a stool softener, such as Colace (over the counter) 100 mg twice a day.  Use MiraLax (over the counter) for constipation as needed.   Diet - low sodium heart healthy   Complete by:  As directed    Increase activity slowly as tolerated   Complete by:  As directed       Allergies as of 04/02/2018      Reactions   Norco [hydrocodone-acetaminophen] Itching      Medication List    TAKE these medications   acetaminophen 650 MG CR tablet Commonly known as:  TYLENOL Take 1,300 mg by mouth 2 (two) times daily.   apixaban 5 MG Tabs tablet Commonly known as:  ELIQUIS Take 1 tablet (5 mg total) by mouth 2 (two) times daily.   calcitRIOL 0.5 MCG capsule Commonly known as:  ROCALTROL TAKE  1 CAPSULE BY MOUTH 2 TIMES DAILY. What changed:  See the new instructions.   calcium carbonate 1250 (500 Ca) MG tablet Commonly known as:  OS-CAL - dosed in mg of elemental calcium Take 1 tablet (1,250 mg total) by mouth 3 (three) times daily with meals. What changed:    how much to take  when to take this   carvedilol 3.125 MG tablet Commonly known as:  COREG Take 1 tablet (3.125 mg total) by mouth 2 (two) times daily with a meal.   cyanocobalamin 1000 MCG/ML injection Commonly known as:  (VITAMIN B-12) Inject 1,000 mcg into the muscle every 30 (thirty) days.   cyclobenzaprine 10 MG tablet Commonly known as:  FLEXERIL Take 20 mg by mouth 2 (two) times daily.   diclofenac sodium 1 % Gel Commonly known as:  VOLTAREN Apply 1 application topically 2  (two) times daily as needed (for knee pain).   DULoxetine 60 MG capsule Commonly known as:  CYMBALTA Take 1 capsule (60 mg total) by mouth daily.   eszopiclone 2 MG Tabs tablet Commonly known as:  LUNESTA Take 2 mg by mouth at bedtime.   furosemide 20 MG tablet Commonly known as:  LASIX Take 20 mg by mouth daily.   levothyroxine 200 MCG tablet Commonly known as:  SYNTHROID, LEVOTHROID Take 200 mcg by mouth daily before breakfast.   LYRICA 200 MG capsule Generic drug:  pregabalin Take 200 mg by mouth 2 (two) times daily.   magnesium oxide 400 MG tablet Commonly known as:  MAG-OX Take 400 mg by mouth daily.   methocarbamol 500 MG tablet Commonly known as:  ROBAXIN Take 1 tablet (500 mg total) by mouth 3 (three) times daily as needed.   MYRBETRIQ 50 MG Tb24 tablet Generic drug:  mirabegron ER Take 50 mg by mouth daily.   senna 8.6 MG Tabs tablet Commonly known as:  SENOKOT Take 1 tablet (8.6 mg total) by mouth daily.   traMADol 50 MG tablet Commonly known as:  ULTRAM Take 50 mg by mouth every 6 (six) hours as needed for pain. What changed:  Another medication with the same name was added. Make sure you understand how and when to take each.   traMADol 50 MG tablet Commonly known as:  ULTRAM Take 1-2 tablets (50-100 mg total) by mouth every 6 (six) hours as needed for moderate pain or severe pain. What changed:  You were already taking a medication with the same name, and this prescription was added. Make sure you understand how and when to take each.   traZODone 50 MG tablet Commonly known as:  DESYREL Take 50 mg by mouth at bedtime.   Vitamin D (Ergocalciferol) 1.25 MG (50000 UT) Caps capsule Commonly known as:  DRISDOL Take 50,000 Units by mouth every Wednesday.         Signed: Augustin Schooling 04/02/2018, 7:35 AM  [facility] is now [facility] [address] [place], [place], [place] [address] Phone: [place]

## 2018-04-02 NOTE — Care Management Important Message (Signed)
Important Message  Patient Details  Name: Jillian Eaton MRN: [id number] Date of Birth: [birth year]   Medicare Important Message Given:  Yes    Davanta [address] 04/02/2018, 4:15 PM

## 2018-04-02 NOTE — Progress Notes (Signed)
Spoke with on call MD, will address AVS issue.  Will attempt to reprint when AVS is ready.  AKingBSNRN

## 2018-04-02 NOTE — Progress Notes (Signed)
Unable to print AVS, call placed to [facility] to notify.  AKingBSNRN

## 2018-04-02 NOTE — Clinical Social Work Note (Signed)
Clinical Social Work Assessment  Patient Details  Name: Jillian Eaton MRN: [id number] Date of Birth: 16-Jan-1951  Date of referral:  04/02/18               Reason for consult:  Discharge Planning                Permission sought to share information with:  Case Manager, Facility Sport and exercise psychologist Permission granted to share information::  Yes, Verbal Permission Granted  Name::        Agency::  SNF  Relationship::     Contact Information:     Housing/Transportation Living arrangements for the past 2 months:  Single Family Home Source of Information:  Patient Patient Interpreter Needed:  None Criminal Activity/Legal Involvement Pertinent to Current Situation/Hospitalization:  No - Comment as needed Significant Relationships:  Spouse Lives with:  Spouse Do you feel safe going back to the place where you live?  No Need for family participation in patient care:  No (Coment)  Care giving concerns:  CSW received referral for possible SNF placement at time of discharge. Spoke with patient regarding possibility of SNF placement . Patient's husband   is currently unable to care for her at their home given patient's current needs and fall risk.  Patient  expressed understanding of PT recommendation and are agreeable to SNF placement at time of discharge. CSW to continue to follow and assist with discharge planning needs.     Social Worker assessment / plan:  Spoke with patient concerning possibility of rehab at SNF before returning home.    Employment status:  Retired Forensic scientist:  Medicare PT Recommendations:  [place] / Referral to community resources:  [place]  Patient/Family's Response to care:  Patient recognizes need for rehab before returning home and are agreeable to a SNF in [place]. They report preference for  [facility]     . CSW explained insurance authorization process. Patient's family  reported that they want patient to get stronger to be able to come back home.    Patient/Family's Understanding of and Emotional Response to Diagnosis, Current Treatment, and Prognosis:  Patient/family is realistic regarding therapy needs and expressed being hopeful for SNF placement. Patient expressed understanding of CSW role and discharge process as well as medical condition. No questions/concerns about plan or treatment.    Emotional Assessment Appearance:  Appears stated age Attitude/Demeanor/Rapport:  Engaged Affect (typically observed):  Accepting, Adaptable Orientation:  Oriented to Self, Oriented to Place, Oriented to  Time, Oriented to Situation Alcohol / Substance use:  Not Applicable Psych involvement (Current and /or in the community):  No (Comment)  Discharge Needs  Concerns to be addressed:  Discharge Planning Concerns Readmission within the last 30 days:  No Current discharge risk:  Dependent with Mobility Barriers to Discharge:  Continued Medical Work up   [employer], LCSW 04/02/2018, 10:53 AM

## 2018-04-02 NOTE — Progress Notes (Signed)
Attempt to call report @ [phone], left message with [place]ita for assigned nurse to call 5N back for report.  AKingBSNRN

## 2018-04-02 NOTE — Progress Notes (Signed)
Orthopedics Progress Note  Subjective: Patient reports improving pain with the shoulder but still having trouble mobilizing.  Objective:  Vitals:   04/01/18 2036 04/02/18 0403  BP: 94/63 100/74  Pulse: (!) 107 100  Resp: 15 15  Temp: 98.4 F (36.9 C) 98.3 F (36.8 C)  SpO2: 90% 98%    General: Awake and alert  Musculoskeletal: Right shoulder incision CDI, dressing intact Neurovascularly intact  Lab Results  Component Value Date   WBC 5.0 03/25/2018   HGB 9.8 (L) 03/30/2018   HCT 32.7 (L) 03/30/2018   MCV 94.1 03/25/2018   PLT 164 03/25/2018       Component Value Date/Time   NA 139 03/30/2018 0401   NA 146 (H) 06/19/2017 1222   K 5.0 03/30/2018 0401   CL 98 03/30/2018 0401   CO2 30 03/30/2018 0401   GLUCOSE 113 (H) 03/30/2018 0401   BUN 15 03/30/2018 0401   BUN 21 06/19/2017 1222   CREATININE 0.90 03/30/2018 0401   CREATININE 1.01 (H) 11/27/2017 1029   CALCIUM 6.9 (L) 03/30/2018 0401   CALCIUM 5.5 (LL) 07/10/2017 2104   GFRNONAA >60 03/30/2018 0401   GFRNONAA 58 (L) 11/27/2017 1029   GFRAA >60 03/30/2018 0401   GFRAA 68 11/27/2017 1029    Lab Results  Component Value Date   INR 1.32 03/29/2018   INR 1.29 01/11/2018   INR 1.14 08/28/2016    Assessment/Plan: POD #4 s/p Procedure(s): REVERSE SHOULDER ARTHROPLASTY Will need transfer to SNF for rehab due to 2 plus max assist per therapy.  She is not a candidate for CIR nor going home. Need FL-2 ASAP Continue PT, OT D/C to SNF when bed available  Doran Heater. Veverly Fells, MD 04/02/2018 7:33 AM

## 2018-04-03 ENCOUNTER — Non-Acute Institutional Stay (SKILLED_NURSING_FACILITY): Payer: Medicare Other | Admitting: Internal Medicine

## 2018-04-03 ENCOUNTER — Encounter: Payer: Self-pay | Admitting: Internal Medicine

## 2018-04-03 DIAGNOSIS — M75101 Unspecified rotator cuff tear or rupture of right shoulder, not specified as traumatic: Secondary | ICD-10-CM | POA: Diagnosis not present

## 2018-04-03 DIAGNOSIS — D62 Acute posthemorrhagic anemia: Secondary | ICD-10-CM | POA: Diagnosis not present

## 2018-04-03 DIAGNOSIS — E890 Postprocedural hypothyroidism: Secondary | ICD-10-CM | POA: Diagnosis not present

## 2018-04-03 DIAGNOSIS — M12811 Other specific arthropathies, not elsewhere classified, right shoulder: Secondary | ICD-10-CM

## 2018-04-03 DIAGNOSIS — N3281 Overactive bladder: Secondary | ICD-10-CM

## 2018-04-03 DIAGNOSIS — G4701 Insomnia due to medical condition: Secondary | ICD-10-CM

## 2018-04-03 DIAGNOSIS — M797 Fibromyalgia: Secondary | ICD-10-CM | POA: Diagnosis not present

## 2018-04-03 DIAGNOSIS — Z96611 Presence of right artificial shoulder joint: Secondary | ICD-10-CM

## 2018-04-03 DIAGNOSIS — I10 Essential (primary) hypertension: Secondary | ICD-10-CM

## 2018-04-03 MED ORDER — OXYCODONE HCL 5 MG PO CAPS: 5.0000 mg | ORAL_CAPSULE | Freq: Two times a day (BID) | ORAL | 0 refills | Status: DC

## 2018-04-03 NOTE — Progress Notes (Signed)
:  Location:  [place] Room Number: [phone] Place of Service:  SNF (31)  Brynda Heick D. Sheppard Coil, MD  Patient Care Team: [name], MD as PCP - General (Internal Medicine) [name] Nadean Corwin, MD as PCP - Cardiology (Cardiology) Seltzer, Desiree Hane, MD (Oncology) Kerin Perna., MD as Referring Physician (Neurology)  Extended Emergency Contact Information Primary Emergency Contact: Gaal,Theodore Address: [address]          [place], [place] [address] Johnnette Litter of [place] Phone: [phone] Relation: Spouse Secondary Emergency Contact: [place] of [place] Phone: [phone] Relation: Niece     Allergies: Norco [hydrocodone-acetaminophen]  Chief Complaint  Patient presents with  . New Admit To SNF    Admit to [facility]    HPI: Patient is 68 y.o. female with asthma, CHF, fibromyalgia, hypertension, thyroid disease- goiter,,Who was admitted to [facility] from 2/7-11 for a planned right shoulder arthroplasty for a right shoulder rotator cuff tear.  There were no apparent complications from the surgery but hospital course was complicated by the fact the patient struggled to move and walk due to weakness and balance issues.  Patient is admitted to skilled nursing facility for OT/PT.  While at skilled nursing facility patient will be followed for hypertension treated with Coreg and Lasix, hypothyroidism treated with replacement, and fibromyalgia treated with Lyrica.  Past Medical History:  Diagnosis Date  . Anemia   . Asthma    flare up last yr  . CHF (congestive heart failure) ([place])   . Chronic headache   . DOE (dyspnea on exertion)    2D ECHO, 02/12/2012 - EF 60-65%, moderate concentric hypertrophy  . Fibromyalgia    nerve pain"left side at waist level" "can't lay on that side without pain" , "HOB elevation helps"  . Heart murmur   . Hematuria - cause not known   . History of kidney stones    x 2  '13, '14 surgery to remove  . Hypertension   . SBO (small bowel obstruction) ([place]) 06/07/2013  . Sleep apnea    tested several times and came up negative  . Thyroid disease    "goiter"  . Transfusion history    10 yrs+    Past Surgical History:  Procedure Laterality Date  . CARDIAC CATHETERIZATION  04/04/2010   No significant obstructive coronary artery disease  . CARDIOVERSION N/A 08/08/2017   Procedure: CARDIOVERSION;  Surgeon: Pixie Casino, MD;  Location: [facility];  Service: Cardiovascular;  Laterality: N/A;  . CHOLECYSTECTOMY  1990  . COLONOSCOPY W/ POLYPECTOMY    . COLONOSCOPY WITH PROPOFOL N/A 04/10/2014   Procedure: COLONOSCOPY WITH PROPOFOL;  Surgeon: Beryle Beams, MD;  Location: [facility];  Service: Endoscopy;  Laterality: N/A;  . DIAGNOSTIC LAPAROSCOPY     x2 bowel obstructions(adhesions)  . EYE SURGERY     lasik 20-25 yrs ago  . 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 REVERSE SHOULDER ARTHROPLASTY Right 03/29/2018   Procedure: REVERSE SHOULDER ARTHROPLASTY;  Surgeon: Netta Cedars, MD;  Location: [facility];  Service: Orthopedics;  Laterality: Right;  . THYROIDECTOMY    . TUBAL LIGATION  1986    Allergies as of 04/03/2018      Reactions   Norco [hydrocodone-acetaminophen] Itching      Medication List       Accurate as of April 03, 2018 11:32 AM. Always use your most recent  med list.        acetaminophen 650 MG CR tablet Commonly known as:  TYLENOL Take 1,300 mg by mouth 2 (two) times daily.   apixaban 5 MG Tabs tablet Commonly known as:  ELIQUIS Take 1 tablet (5 mg total) by mouth 2 (two) times daily.   calcitRIOL 0.5 MCG capsule Commonly known as:  ROCALTROL TAKE 1 CAPSULE BY MOUTH 2 TIMES DAILY.   calcium carbonate 1250 (500 Ca) MG tablet Commonly known as:  OS-CAL - dosed in mg of elemental calcium Take 1 tablet (1,250 mg total) by mouth 3 (three) times daily with meals.   carvedilol 3.125 MG  tablet Commonly known as:  COREG Take 1 tablet (3.125 mg total) by mouth 2 (two) times daily with a meal.   cyanocobalamin 1000 MCG/ML injection Commonly known as:  (VITAMIN B-12) Inject 1,000 mcg into the muscle every 30 (thirty) days.   cyclobenzaprine 10 MG tablet Commonly known as:  FLEXERIL Take 20 mg by mouth 2 (two) times daily.   diclofenac sodium 1 % Gel Commonly known as:  VOLTAREN Apply 1 application topically 2 (two) times daily as needed (for knee pain).   DULoxetine 60 MG capsule Commonly known as:  CYMBALTA Take 1 capsule (60 mg total) by mouth daily.   eszopiclone 2 MG Tabs tablet Commonly known as:  LUNESTA Take 1 tablet (2 mg total) by mouth at bedtime.   furosemide 20 MG tablet Commonly known as:  LASIX Take 20 mg by mouth daily.   levothyroxine 200 MCG tablet Commonly known as:  SYNTHROID, LEVOTHROID Take 200 mcg by mouth daily before breakfast.   LYRICA 200 MG capsule Generic drug:  pregabalin Take 1 capsule (200 mg total) by mouth 2 (two) times daily.   magnesium oxide 400 MG tablet Commonly known as:  MAG-OX Take 400 mg by mouth daily.   methocarbamol 500 MG tablet Commonly known as:  ROBAXIN Take 1 tablet (500 mg total) by mouth 3 (three) times daily as needed.   MYRBETRIQ 50 MG Tb24 tablet Generic drug:  mirabegron ER Take 50 mg by mouth daily.   senna 8.6 MG Tabs tablet Commonly known as:  SENOKOT Take 1 tablet (8.6 mg total) by mouth daily.   traMADol 50 MG tablet Commonly known as:  ULTRAM Take 50 mg by mouth every 6 (six) hours as needed for pain.   traMADol 50 MG tablet Commonly known as:  ULTRAM Take 1-2 tablets (50-100 mg total) by mouth every 6 (six) hours as needed for moderate pain or severe pain (for up to 2 weeks).   traZODone 50 MG tablet Commonly known as:  DESYREL Take 50 mg by mouth at bedtime.   Vitamin D (Ergocalciferol) 1.25 MG (50000 UT) Caps capsule Commonly known as:  DRISDOL Take 50,000 Units by mouth  every Wednesday.       No orders of the defined types were placed in this encounter.   Immunization History  Administered Date(s) Administered  . Influenza,inj,quad, With Preservative 02/22/2016, 03/05/2017  . Influenza-Unspecified 11/19/2014, 03/21/2016  . Pneumococcal Polysaccharide-23 05/01/2016    Social History   Tobacco Use  . Smoking status: Never Smoker  . Smokeless tobacco: Never Used  Substance Use Topics  . Alcohol use: No    Comment: wine occ    Family history is   Family History  Problem Relation Age of Onset  . Diabetes Mother   . Epilepsy Mother   . Cancer Mother  Breast  . Hypertension Mother   . Breast cancer Mother   . Kidney disease Father   . Diabetes Father   . Hypertension Father   . Asthma Father   . Heart disease Father   . Epilepsy Sister   . Cancer Maternal Grandmother   . Breast cancer Maternal Grandmother   . Cancer Paternal Grandmother   . Breast cancer Paternal Grandmother       Review of Systems  DATA OBTAINED: from patient GENERAL:  no fevers, fatigue, appetite changes SKIN: No itching, or rash EYES: No eye pain, redness, discharge EARS: No earache, tinnitus, change in hearing NOSE: No congestion, drainage or bleeding  MOUTH/THROAT: No mouth or tooth pain, No sore throat RESPIRATORY: No cough, wheezing, SOB CARDIAC: No chest pain, palpitations, lower extremity edema  GI: No abdominal pain, No N/V/D or constipation, No heartburn or reflux  GU: No dysuria, frequency or urgency, or incontinence  MUSCULOSKELETAL: No unrelieved bone/joint pain NEUROLOGIC: No headache, dizziness or focal weakness PSYCHIATRIC: No c/o anxiety or sadness   Vitals:   04/03/18 1130  BP: 104/72  Pulse: (!) 103  Resp: 20  Temp: 97.6 F (36.4 C)    SpO2 Readings from Last 1 Encounters:  04/02/18 98%   Body mass index is 50.59 kg/m.     Physical Exam  GENERAL APPEARANCE: Alert, conversant,  No acute distress.  SKIN: No  diaphoresis rash HEAD: Normocephalic, atraumatic  EYES: Conjunctiva/lids clear. Pupils round, reactive. EOMs intact.  EARS: External exam WNL, canals clear. Hearing grossly normal.  NOSE: No deformity or discharge.  MOUTH/THROAT: Lips w/o lesions  RESPIRATORY: Breathing is even, unlabored. Lung sounds are clear   CARDIOVASCULAR: Heart RRR no murmurs, rubs or gallops. No peripheral edema.   GASTROINTESTINAL: Abdomen is soft, non-tender, not distended w/ normal bowel sounds. GENITOURINARY: Bladder non tender, not distended  MUSCULOSKELETAL: Right shoulder in sling NEUROLOGIC:  Cranial nerves 2-12 grossly intact. Moves all extremities  PSYCHIATRIC: Mood and affect appropriate to situation, no behavioral issues  Patient Active Problem List   Diagnosis Date Noted  . Steroid-induced hyperglycemia   . Acute blood loss anemia   . Generalized OA   . Super obese   . 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 NICM (nonischemic cardiomyopathy) ([place]) 01/15/2018  . Osteoporosis 12/13/2017  . Chronic diastolic CHF (congestive heart failure) ([place]) 09/28/2017  . 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 Hypercapnemia 07/13/2017  . AKI (acute kidney injury) ([place]) 07/10/2017  . On anticoagulant therapy 06/19/2017  . GERD (gastroesophageal reflux disease) 08/28/2016  . 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 Depression 08/28/2016  . Iatrogenic hypocalcemia 08/28/2016  . Chronic pain syndrome   . Fibromyalgia   . Asthma   . Acute diastolic (congestive) heart failure ([place])   . Atrial fibrillation ([place]) 04/28/2016  . Vocal cord dysfunction 04/28/2016  . Thrombocytopenia ([place]) 04/28/2016  . Seizures ([place])   . Preoperative cardiovascular examination   . Leg swelling 10/19/2015  . Acute on chronic respiratory failure with hypoxia and hypercapnia ([place]) 08/03/2015  . Bilateral leg edema 05/11/2015  . Essential hypertension 05/11/2015  . Pulmonary hypertension ([place]) 01/08/2013  .  Anemia of chronic disease 03/28/2011  . Morbid obesity ([place]) 09/22/2010      Labs reviewed: Basic Metabolic Panel:    Component Value Date/Time   NA 139 03/30/2018 0401   NA 146 (H) 06/19/2017 1222   K 5.0 03/30/2018 0401   CL 98 03/30/2018 0401   CO2 30 03/30/2018 0401  GLUCOSE 113 (H) 03/30/2018 0401   BUN 15 03/30/2018 0401   BUN 21 06/19/2017 1222   CREATININE 0.90 03/30/2018 0401   CREATININE 1.01 (H) 11/27/2017 1029   CALCIUM 6.9 (L) 03/30/2018 0401   CALCIUM 5.5 (LL) 07/10/2017 2104   PROT 7.0 09/21/2017 1819   PROT 6.8 06/19/2017 1222   ALBUMIN 3.7 09/21/2017 1819   ALBUMIN 3.8 06/19/2017 1222   AST 19 09/21/2017 1819   ALT 12 09/21/2017 1819   ALKPHOS 77 09/21/2017 1819   BILITOT 0.7 09/21/2017 1819   BILITOT 0.4 06/19/2017 1222   GFRNONAA >60 03/30/2018 0401   GFRNONAA 58 (L) 11/27/2017 1029   GFRAA >60 03/30/2018 0401   GFRAA 68 11/27/2017 1029    Recent Labs    07/10/17 2104  07/15/17 0239  08/03/17 1205  11/27/17 1029  03/25/18 2109 03/29/18 0645 03/30/18 0401  NA  --    < > 140   < > 143   < > 142   < > 143 140 139  K  --    < > 4.3   < > 4.5   < > 4.4   < > 4.6 4.5 5.0  CL  --    < > 99*   < > 101   < > 101   < > 105 101 98  CO2  --    < > 34*   < > 33*   < > 30   < > 28 27 30   GLUCOSE  --    < > 84   < > 87   < > 81   < > 85 88 113*  BUN  --    < > 20   < > 45*   < > 29*   < > 24* 20 15  CREATININE  --    < > 0.98   < > 1.58*   < > 1.01*   < > 0.93 0.99 0.90  CALCIUM 5.5*   < > 6.2*   < > 5.7*   < > 8.9   < > 7.1* 7.7* 6.9*  MG 1.2*   < > 2.1  --  1.5  --  1.6  --   --   --   --   PHOS 7.4*  --   --   --  6.3*  --  3.8  --   --   --   --    < > = values in this interval not displayed.   Liver Function Tests: Recent Labs    07/11/17 0219 07/14/17 0039 09/21/17 1819  AST 16 16 19   ALT 9* 8* 12  ALKPHOS 93 85 77  BILITOT 0.7 0.6 0.7  PROT 6.6 5.7* 7.0  ALBUMIN 3.1* 2.6* 3.7   No results for input(s): LIPASE, AMYLASE in the last 8760  hours. No results for input(s): AMMONIA in the last 8760 hours. CBC: Recent Labs    07/11/17 0219  09/21/17 1819 01/03/18 1406 03/21/18 1346 03/25/18 2109 03/30/18 0401  WBC 5.3   < > 5.3 3.9* 3.9* 5.0  --   NEUTROABS 3.1  --  3.0  --   --  3.0  --   HGB 9.8*   < > 11.1* 12.8 11.0* 10.6* 9.8*  HCT 33.3*   < > 36.4 43.9 36.5 35.0* 32.7*  MCV 85.2   < > 89.2 89.4 93.1 94.1  --   PLT 131*   < > 202 161 152 164  --    < > =  values in this interval not displayed.   Lipid No results for input(s): CHOL, HDL, LDLCALC, TRIG in the last 8760 hours.  Cardiac Enzymes: Recent Labs    09/21/17 1819 09/21/17 2229  TROPONINI <0.03 <0.03   BNP: Recent Labs    07/10/17 1700 09/18/17 1454 09/21/17 1819  BNP 246.2* 136.1* 124.6*   No results found for: MICROALBUR No results found for: HGBA1C Lab Results  Component Value Date   TSH 1.21 11/27/2017   Lab Results  Component Value Date   VITAMINB12 233 07/15/2017   Lab Results  Component Value Date   FOLATE 15.8 03/27/2012   Lab Results  Component Value Date   IRON 28 07/15/2017   TIBC 433 07/15/2017   FERRITIN 15 07/15/2017    Imaging and Procedures obtained prior to SNF admission: No results found.   Not all labs, radiology exams or other studies done during hospitalization come through on my EPIC note; however they are reviewed by me.    Assessment and Plan  Right rotator cuff tear/status post right shoulder arthroplasty-no reported complications SNF- med for OT/PT; appears to be being prophylaxed with Eliquis 5 mg twice daily although this is not stated, which she is already on for atrial fibrillation  Acute blood loss anemia, postop SNF- hemoglobin fell from 10.6-9.8; no transfusions; follow-up CBC  Hypertension SNF- controlled continue Coreg 3.125 mg twice daily and Lasix 20 mg daily  Hypothyroidism SNF- not stated as uncontrolled; secondary to papillary microcarcinoma thyroid, postop; continue  levothyroxine 200 mg daily  Fibromyalgia SNF-not stated as uncontrolled; continue Lyrica 200 mg twice daily  Overactive bladder SNF-not stated as uncontrolled; continue 50 mg daily  Insomnia SNF- continue trazodone 50 mg nightly and Lunesta 2 mg nightly   Time spent greater than 45 minutes;> 50% of time with patient was spent reviewing records, labs, tests and studies, counseling and developing plan of care  Webb Silversmith D. Sheppard Coil, MD

## 2018-04-05 ENCOUNTER — Encounter: Payer: Self-pay | Admitting: Internal Medicine

## 2018-04-05 ENCOUNTER — Non-Acute Institutional Stay (SKILLED_NURSING_FACILITY): Payer: Medicare Other | Admitting: Internal Medicine

## 2018-04-05 DIAGNOSIS — M75101 Unspecified rotator cuff tear or rupture of right shoulder, not specified as traumatic: Secondary | ICD-10-CM | POA: Diagnosis not present

## 2018-04-05 DIAGNOSIS — I270 Primary pulmonary hypertension: Secondary | ICD-10-CM | POA: Diagnosis not present

## 2018-04-05 DIAGNOSIS — F331 Major depressive disorder, recurrent, moderate: Secondary | ICD-10-CM | POA: Diagnosis not present

## 2018-04-05 DIAGNOSIS — K5900 Constipation, unspecified: Secondary | ICD-10-CM | POA: Diagnosis not present

## 2018-04-05 DIAGNOSIS — I10 Essential (primary) hypertension: Secondary | ICD-10-CM

## 2018-04-05 DIAGNOSIS — M12811 Other specific arthropathies, not elsewhere classified, right shoulder: Secondary | ICD-10-CM | POA: Diagnosis not present

## 2018-04-05 DIAGNOSIS — D649 Anemia, unspecified: Secondary | ICD-10-CM | POA: Diagnosis not present

## 2018-04-05 DIAGNOSIS — K219 Gastro-esophageal reflux disease without esophagitis: Secondary | ICD-10-CM | POA: Diagnosis not present

## 2018-04-05 DIAGNOSIS — N179 Acute kidney failure, unspecified: Secondary | ICD-10-CM | POA: Diagnosis not present

## 2018-04-05 DIAGNOSIS — G4730 Sleep apnea, unspecified: Secondary | ICD-10-CM | POA: Diagnosis not present

## 2018-04-05 DIAGNOSIS — R609 Edema, unspecified: Secondary | ICD-10-CM | POA: Diagnosis not present

## 2018-04-05 DIAGNOSIS — J9620 Acute and chronic respiratory failure, unspecified whether with hypoxia or hypercapnia: Secondary | ICD-10-CM | POA: Diagnosis not present

## 2018-04-05 DIAGNOSIS — M6281 Muscle weakness (generalized): Secondary | ICD-10-CM | POA: Diagnosis not present

## 2018-04-05 DIAGNOSIS — M797 Fibromyalgia: Secondary | ICD-10-CM | POA: Diagnosis not present

## 2018-04-05 DIAGNOSIS — D693 Immune thrombocytopenic purpura: Secondary | ICD-10-CM | POA: Diagnosis not present

## 2018-04-05 DIAGNOSIS — Z96611 Presence of right artificial shoulder joint: Secondary | ICD-10-CM

## 2018-04-05 DIAGNOSIS — D62 Acute posthemorrhagic anemia: Secondary | ICD-10-CM

## 2018-04-05 DIAGNOSIS — G4700 Insomnia, unspecified: Secondary | ICD-10-CM

## 2018-04-05 DIAGNOSIS — N3281 Overactive bladder: Secondary | ICD-10-CM | POA: Insufficient documentation

## 2018-04-05 DIAGNOSIS — E890 Postprocedural hypothyroidism: Secondary | ICD-10-CM

## 2018-04-05 DIAGNOSIS — Z4789 Encounter for other orthopedic aftercare: Secondary | ICD-10-CM | POA: Diagnosis not present

## 2018-04-05 DIAGNOSIS — F339 Major depressive disorder, recurrent, unspecified: Secondary | ICD-10-CM | POA: Diagnosis not present

## 2018-04-05 DIAGNOSIS — I5032 Chronic diastolic (congestive) heart failure: Secondary | ICD-10-CM | POA: Diagnosis not present

## 2018-04-05 DIAGNOSIS — G4701 Insomnia due to medical condition: Secondary | ICD-10-CM

## 2018-04-05 DIAGNOSIS — R5383 Other fatigue: Secondary | ICD-10-CM | POA: Diagnosis not present

## 2018-04-05 DIAGNOSIS — Z471 Aftercare following joint replacement surgery: Secondary | ICD-10-CM | POA: Diagnosis not present

## 2018-04-05 DIAGNOSIS — J45998 Other asthma: Secondary | ICD-10-CM | POA: Diagnosis not present

## 2018-04-05 DIAGNOSIS — E039 Hypothyroidism, unspecified: Secondary | ICD-10-CM | POA: Diagnosis not present

## 2018-04-05 DIAGNOSIS — I482 Chronic atrial fibrillation, unspecified: Secondary | ICD-10-CM | POA: Diagnosis not present

## 2018-04-05 DIAGNOSIS — Z6841 Body Mass Index (BMI) 40.0 and over, adult: Secondary | ICD-10-CM | POA: Diagnosis not present

## 2018-04-05 DIAGNOSIS — I5033 Acute on chronic diastolic (congestive) heart failure: Secondary | ICD-10-CM | POA: Diagnosis not present

## 2018-04-05 DIAGNOSIS — I5031 Acute diastolic (congestive) heart failure: Secondary | ICD-10-CM | POA: Diagnosis not present

## 2018-04-05 MED ORDER — SENNA 8.6 MG PO TABS: 1.0000 | ORAL_TABLET | Freq: Every day | ORAL | 0 refills | Status: DC

## 2018-04-05 MED ORDER — MAGNESIUM OXIDE 400 MG PO TABS: 400.0000 mg | ORAL_TABLET | Freq: Every day | ORAL | 0 refills | Status: DC

## 2018-04-05 MED ORDER — ESZOPICLONE 2 MG PO TABS: 2.0000 mg | ORAL_TABLET | Freq: Every day | ORAL | 0 refills | Status: AC

## 2018-04-05 MED ORDER — OXYCODONE HCL 5 MG PO CAPS: 5.0000 mg | ORAL_CAPSULE | Freq: Two times a day (BID) | ORAL | 0 refills | Status: DC

## 2018-04-05 MED ORDER — TRAZODONE HCL 50 MG PO TABS: 50.0000 mg | ORAL_TABLET | Freq: Every day | ORAL | 0 refills | Status: DC

## 2018-04-05 MED ORDER — DULOXETINE HCL 60 MG PO CPEP: 60.0000 mg | ORAL_CAPSULE | Freq: Every day | ORAL | 0 refills | Status: DC

## 2018-04-05 MED ORDER — CYCLOBENZAPRINE HCL 10 MG PO TABS: 20.0000 mg | ORAL_TABLET | Freq: Two times a day (BID) | ORAL | 0 refills | Status: DC

## 2018-04-05 MED ORDER — CARVEDILOL 3.125 MG PO TABS: 3.1250 mg | ORAL_TABLET | Freq: Two times a day (BID) | ORAL | 0 refills | Status: DC

## 2018-04-05 MED ORDER — VITAMIN D (ERGOCALCIFEROL) 1.25 MG (50000 UNIT) PO CAPS: 50000.0000 [IU] | ORAL_CAPSULE | ORAL | 0 refills | Status: AC

## 2018-04-05 MED ORDER — CALCITRIOL 0.5 MCG PO CAPS: ORAL_CAPSULE | ORAL | 0 refills | Status: DC

## 2018-04-05 MED ORDER — CYANOCOBALAMIN 1000 MCG/ML IJ SOLN: 1000.0000 ug | INTRAMUSCULAR | 0 refills | Status: AC

## 2018-04-05 MED ORDER — MIRABEGRON ER 50 MG PO TB24: 50.0000 mg | ORAL_TABLET | Freq: Every day | ORAL | 0 refills | Status: DC

## 2018-04-05 MED ORDER — CALCIUM CARBONATE 1250 (500 CA) MG PO TABS: 2.0000 | ORAL_TABLET | Freq: Two times a day (BID) | ORAL | 0 refills | Status: DC

## 2018-04-05 MED ORDER — METHOCARBAMOL 500 MG PO TABS: 500.0000 mg | ORAL_TABLET | Freq: Three times a day (TID) | ORAL | 0 refills | Status: DC | PRN

## 2018-04-05 MED ORDER — FUROSEMIDE 20 MG PO TABS: 20.0000 mg | ORAL_TABLET | Freq: Every day | ORAL | 0 refills | Status: DC

## 2018-04-05 MED ORDER — DICLOFENAC SODIUM 1 % TD GEL: 2.0000 g | Freq: Two times a day (BID) | TRANSDERMAL | 0 refills | Status: DC | PRN

## 2018-04-05 MED ORDER — LYRICA 200 MG PO CAPS: 200.0000 mg | ORAL_CAPSULE | Freq: Two times a day (BID) | ORAL | 0 refills | Status: DC

## 2018-04-05 MED ORDER — APIXABAN 5 MG PO TABS: 5.0000 mg | ORAL_TABLET | Freq: Two times a day (BID) | ORAL | 0 refills | Status: DC

## 2018-04-05 MED ORDER — LEVOTHYROXINE SODIUM 200 MCG PO TABS: 200.0000 ug | ORAL_TABLET | Freq: Every day | ORAL | 0 refills | Status: DC

## 2018-04-05 NOTE — Progress Notes (Signed)
Location:   [facility] of Service:   Skilled nursing facility Provider: Hennie Duos MD   PCP: [name], MD Patient Care Team: [name], MD as PCP - General (Internal Medicine) Pixie Casino, MD as PCP - Cardiology (Cardiology) Seltzer, Desiree Hane, MD (Oncology) Kerin Perna., MD as Referring Physician (Neurology)  Extended Emergency Contact Information Primary Emergency Contact: Hindes,Theodore Address: [place], [place] [address] Johnnette Litter of [place] Phone: [phone] Relation: Spouse Secondary Emergency Contact: [place] of [place] Phone: [phone] Relation: Niece  Allergies  Allergen Reactions  . Norco [Hydrocodone-Acetaminophen] Itching    Chief Complaint  Patient presents with  . Discharge Note    HPI:  68 y.o. female with asthma, congestive heart failure, fibromyalgia, hypertension, thyroid disease-goiter, who was admitted to [facility] from 2/7-11 for a planned right shoulder arthroplasty for right shoulder rotator cuff tear.  There were no apparent complications from the surgery but hospital course was complicated by the fact that patient struggled to move and walk due to weakness and balance issues.  Patient was admitted to skilled nursing facility for OT/PT.  Patient wishes to go to [facility] for her [facility] [place] the proximity of her family to that facility and the fact that her husband has no car.    Past Medical History:  Diagnosis Date  . Anemia   . Asthma    flare up last yr  . CHF (congestive heart failure) ([place])   . Chronic headache   . DOE (dyspnea on exertion)    2D ECHO, 02/12/2012 - EF 60-65%, moderate concentric hypertrophy  . Fibromyalgia    nerve pain"left side at waist level" "can't lay on that side without pain" , "HOB elevation helps"  . Heart murmur   . Hematuria - cause not known   . History of kidney stones    x 2 '13, '14  surgery to remove  . Hypertension   . SBO (small bowel obstruction) ([place]) 06/07/2013  . Sleep apnea    tested several times and came up negative  . Thyroid disease    "goiter"  . Transfusion history    10 yrs+    Past Surgical History:  Procedure Laterality Date  . CARDIAC CATHETERIZATION  04/04/2010   No significant obstructive coronary artery disease  . CARDIOVERSION N/A 08/08/2017   Procedure: CARDIOVERSION;  Surgeon: Pixie Casino, MD;  Location: [place];  Service: Cardiovascular;  Laterality: N/A;  . CHOLECYSTECTOMY  1990  . COLONOSCOPY W/ POLYPECTOMY    . COLONOSCOPY WITH PROPOFOL N/A 04/10/2014   Procedure: COLONOSCOPY WITH PROPOFOL;  Surgeon: Beryle Beams, MD;  Location: [facility];  Service: Endoscopy;  Laterality: N/A;  . DIAGNOSTIC LAPAROSCOPY     x2 bowel obstructions(adhesions)  . EYE SURGERY     lasik 20-25 yrs ago  . 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 REVERSE SHOULDER ARTHROPLASTY Right 03/29/2018   Procedure: REVERSE SHOULDER ARTHROPLASTY;  Surgeon: Netta Cedars, MD;  Location: [place];  Service: Orthopedics;  Laterality: Right;  . THYROIDECTOMY    . TUBAL LIGATION  1986     reports that she has never smoked. She has never used smokeless tobacco. She reports that she does not drink alcohol or use drugs. Social History   Socioeconomic History  . Marital status: Married    Spouse  name: Not on file  . Number of children: 3  . Years of education: Not on file  . Highest education level: Not on file  Occupational History  . Occupation: Self employed Armed forces operational officer  Social Needs  . Financial resource strain: Not on file  . Food insecurity:    Worry: Not on file    Inability: Not on file  . Transportation needs:    Medical: Not on file    Non-medical: Not on file  Tobacco Use  . Smoking status: Never Smoker  . Smokeless tobacco: Never Used  Substance and Sexual Activity  . Alcohol use: No    Comment:  wine occ  . Drug use: No    Types: Oxycodone    Comment: perscribed  . Sexual activity: Not Currently    Birth control/protection: None  Lifestyle  . Physical activity:    Days per week: Not on file    Minutes per session: Not on file  . Stress: Not on file  Relationships  . Social connections:    Talks on phone: Not on file    Gets together: Not on file    Attends religious service: Not on file    Active member of club or organization: Not on file    Attends meetings of clubs or organizations: Not on file    Relationship status: Not on file  . Intimate partner violence:    Fear of current or ex partner: Not on file    Emotionally abused: Not on file    Physically abused: Not on file    Forced sexual activity: Not on file  Other Topics Concern  . Not on file  Social History Narrative   ** Merged History Encounter **        Pertinent  Health Maintenance Due  Topic Date Due  . PAP SMEAR-Modifier  06/07/1973  . PNA vac Low Risk Adult (1 of 2 - PCV13) 06/07/2017  . INFLUENZA VACCINE  09/20/2017  . MAMMOGRAM  12/12/2019  . COLONOSCOPY  04/10/2024  . DEXA SCAN  Completed    Medications: Allergies as of 04/05/2018      Reactions   Norco [hydrocodone-acetaminophen] Itching      Medication List       Accurate as of April 05, 2018 11:59 PM. Always use your most recent med list.        acetaminophen 650 MG CR tablet Commonly known as:  TYLENOL Take 1,300 mg by mouth 3 (three) times daily. At 6 am, 2 pm, and 10 pm   apixaban 5 MG Tabs tablet Commonly known as:  ELIQUIS Take 1 tablet (5 mg total) by mouth 2 (two) times daily.   calcitRIOL 0.5 MCG capsule Commonly known as:  ROCALTROL TAKE 1 CAPSULE BY MOUTH 2 TIMES DAILY.   calcium carbonate 1250 (500 Ca) MG tablet Commonly known as:  OS-CAL - dosed in mg of elemental calcium Take 2 tablets (1,000 mg of elemental calcium total) by mouth 2 (two) times daily.   carvedilol 3.125 MG tablet Commonly known as:   COREG Take 1 tablet (3.125 mg total) by mouth 2 (two) times daily with a meal.   cyanocobalamin 1000 MCG/ML injection Commonly known as:  (VITAMIN B-12) Inject 1 mL (1,000 mcg total) into the muscle every 30 (thirty) days.   cyclobenzaprine 10 MG tablet Commonly known as:  FLEXERIL Take 2 tablets (20 mg total) by mouth 2 (two) times daily.   diclofenac sodium 1 % Gel Commonly known as:  VOLTAREN Apply 2 g topically 2 (two) times daily as needed (for knee pain).   DULoxetine 60 MG capsule Commonly known as:  CYMBALTA Take 1 capsule (60 mg total) by mouth daily.   eszopiclone 2 MG Tabs tablet Commonly known as:  LUNESTA Take 1 tablet (2 mg total) by mouth at bedtime.   furosemide 20 MG tablet Commonly known as:  LASIX Take 1 tablet (20 mg total) by mouth daily.   levothyroxine 200 MCG tablet Commonly known as:  SYNTHROID, LEVOTHROID Take 1 tablet (200 mcg total) by mouth daily before breakfast.   LYRICA 200 MG capsule Generic drug:  pregabalin Take 1 capsule (200 mg total) by mouth 2 (two) times daily.   magnesium oxide 400 MG tablet Commonly known as:  MAG-OX Take 1 tablet (400 mg total) by mouth daily.   methocarbamol 500 MG tablet Commonly known as:  ROBAXIN Take 1 tablet (500 mg total) by mouth 3 (three) times daily as needed.   mirabegron ER 50 MG Tb24 tablet Commonly known as:  MYRBETRIQ Take 1 tablet (50 mg total) by mouth daily.   oxycodone 5 MG capsule Commonly known as:  OXY-IR Take 1 capsule (5 mg total) by mouth 2 (two) times daily. At 10 am and 6 pm   senna 8.6 MG Tabs tablet Commonly known as:  SENOKOT Take 1 tablet (8.6 mg total) by mouth daily.   traZODone 50 MG tablet Commonly known as:  DESYREL Take 1 tablet (50 mg total) by mouth at bedtime.   Vitamin D (Ergocalciferol) 1.25 MG (50000 UT) Caps capsule Commonly known as:  DRISDOL Take 1 capsule (50,000 Units total) by mouth every Wednesday. Start taking on:  April 10, 2018         Vitals:   04/06/18 1151  BP: 110/78  Pulse: 74  Resp: 20  Temp: 98 F (36.7 C)  Weight: 291 lb 8 oz (132.2 kg)  Height: 5\' 5"  (1.651 m)   Body mass index is 48.51 kg/m.  Physical Exam  GENERAL APPEARANCE: Alert, conversant. No acute distress.  HEENT: Unremarkable. RESPIRATORY: Breathing is even, unlabored. Lung sounds are clear   CARDIOVASCULAR: Heart RRR no murmurs, rubs or gallops. No peripheral edema.  GASTROINTESTINAL: Abdomen is soft, non-tender, not distended w/ normal bowel sounds.  NEUROLOGIC: Cranial nerves 2-12 grossly intact. Moves all extremities   Labs reviewed: Basic Metabolic Panel: Recent Labs    07/10/17 2104  07/15/17 0239  08/03/17 1205  11/27/17 1029  03/25/18 2109 03/29/18 0645 03/30/18 0401  NA  --    < > 140   < > 143   < > 142   < > 143 140 139  K  --    < > 4.3   < > 4.5   < > 4.4   < > 4.6 4.5 5.0  CL  --    < > 99*   < > 101   < > 101   < > 105 101 98  CO2  --    < > 34*   < > 33*   < > 30   < > 28 27 30   GLUCOSE  --    < > 84   < > 87   < > 81   < > 85 88 113*  BUN  --    < > 20   < > 45*   < > 29*   < > 24* 20 15  CREATININE  --    < >  0.98   < > 1.58*   < > 1.01*   < > 0.93 0.99 0.90  CALCIUM 5.5*   < > 6.2*   < > 5.7*   < > 8.9   < > 7.1* 7.7* 6.9*  MG 1.2*   < > 2.1  --  1.5  --  1.6  --   --   --   --   PHOS 7.4*  --   --   --  6.3*  --  3.8  --   --   --   --    < > = values in this interval not displayed.   No results found for: [facility] Liver Function Tests: Recent Labs    07/11/17 0219 07/14/17 0039 09/21/17 1819  AST 16 16 19   ALT 9* 8* 12  ALKPHOS 93 85 77  BILITOT 0.7 0.6 0.7  PROT 6.6 5.7* 7.0  ALBUMIN 3.1* 2.6* 3.7   No results for input(s): LIPASE, AMYLASE in the last 8760 hours. No results for input(s): AMMONIA in the last 8760 hours. CBC: Recent Labs    07/11/17 0219  09/21/17 1819 01/03/18 1406 03/21/18 1346 03/25/18 2109 03/30/18 0401  WBC 5.3   < > 5.3 3.9* 3.9* 5.0  --   NEUTROABS 3.1   --  3.0  --   --  3.0  --   HGB 9.8*   < > 11.1* 12.8 11.0* 10.6* 9.8*  HCT 33.3*   < > 36.4 43.9 36.5 35.0* 32.7*  MCV 85.2   < > 89.2 89.4 93.1 94.1  --   PLT 131*   < > 202 161 152 164  --    < > = values in this interval not displayed.   Lipid No results for input(s): CHOL, HDL, LDLCALC, TRIG in the last 8760 hours. Cardiac Enzymes: Recent Labs    09/21/17 1819 09/21/17 2229  TROPONINI <0.03 <0.03   BNP: Recent Labs    07/10/17 1700 09/18/17 1454 09/21/17 1819  BNP 246.2* 136.1* 124.6*   CBG: Recent Labs    03/29/18 2338  GLUCAP 121*    Procedures and Imaging Studies During Stay: Dg Shoulder Right Port  Result Date: 03/29/2018 CLINICAL DATA:  Right shoulder replacement. EXAM: PORTABLE RIGHT SHOULDER COMPARISON:  Right shoulder x-rays dated October 11, 2017. FINDINGS: Interval reverse total shoulder arthroplasty. No evidence of hardware failure or loosening. Expected postsurgical changes in the soft tissues. Unchanged moderate acromioclavicular osteoarthritis. Right basilar atelectasis. IMPRESSION: 1. Interval reverse total shoulder arthroplasty without acute postoperative complication. Electronically Signed   By: Titus Dubin M.D.   On: 03/29/2018 11:08    Assessment/Plan:   Rotator cuff tear arthropathy of right shoulder  S/P shoulder replacement, right  Acute blood loss as cause of postoperative anemia  Essential hypertension  Postoperative hypothyroidism  Overactive bladder  Fibromyalgia  Insomnia due to medical condition   Patient is being discharged with the following home health services:  Per [facility]  Patient is being discharged with the following durable medical equipment:  none  Patient has been advised to f/u with their PCP in 1-2 weeks to bring them up to date on their rehab stay.  Social services at facility was responsible for arranging this appointment.  Pt was provided with a 30 day supply of prescriptions for medications and refills must be  obtained from their PCP.  For controlled substances, a more limited supply may be provided adequate until PCP appointment only.  Medications have been reconciled.  Time spent greater than 30 minutes;> 50% of time with patient was spent reviewing records, labs, tests and studies, counseling and developing plan of care  Inocencio Homes, MD

## 2018-04-05 NOTE — Progress Notes (Signed)
Location:  [place] Room Number: [phone] Place of Service:  SNF [phone])  Jillian Delaine. Sheppard Coil, MD  Patient Care Team: [name], MD as PCP - General (Internal Medicine) [name] Nadean Corwin, MD as PCP - Cardiology (Cardiology) Seltzer, Desiree Hane, MD (Oncology) Kerin Perna., MD as Referring Physician (Neurology)  Extended Emergency Contact Information Primary Emergency Contact: Navarro,Theodore Address: [address]          [place], [place] [address] Johnnette Litter of [place] Phone: [phone] Relation: Spouse Secondary Emergency Contact: [place] of [place] Phone: [phone] Relation: Niece  Allergies  Allergen Reactions  . Norco [Hydrocodone-Acetaminophen] Itching    Chief Complaint  Patient presents with  . Discharge Note    Discharge from [facility]    HPI:  68 y.o. female      Past Medical History:  Diagnosis Date  . Anemia   . Asthma    flare up last yr  . CHF (congestive heart failure) ([place])   . Chronic headache   . DOE (dyspnea on exertion)    2D ECHO, 02/12/2012 - EF 60-65%, moderate concentric hypertrophy  . Fibromyalgia    nerve pain"left side at waist level" "can't lay on that side without pain" , "HOB elevation helps"  . Heart murmur   . Hematuria - cause not known   . History of kidney stones    x 2 '13, '14 surgery to remove  . Hypertension   . SBO (small bowel obstruction) ([place]) 06/07/2013  . Sleep apnea    tested several times and came up negative  . Thyroid disease    "goiter"  . Transfusion history    10 yrs+    Past Surgical History:  Procedure Laterality Date  . CARDIAC CATHETERIZATION  04/04/2010   No significant obstructive coronary artery disease  . CARDIOVERSION N/A 08/08/2017   Procedure: CARDIOVERSION;  Surgeon: Pixie Casino, MD;  Location: [place];  Service: Cardiovascular;  Laterality: N/A;  . CHOLECYSTECTOMY  1990  . COLONOSCOPY W/ POLYPECTOMY      . COLONOSCOPY WITH PROPOFOL N/A 04/10/2014   Procedure: COLONOSCOPY WITH PROPOFOL;  Surgeon: Beryle Beams, MD;  [facility];  Service: Endoscopy;  Laterality: N/A;  . DIAGNOSTIC LAPAROSCOPY     x2 bowel obstructions(adhesions)  . EYE SURGERY     lasik 20-25 yrs ago  . 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 REVERSE SHOULDER ARTHROPLASTY Right 03/29/2018   Procedure: REVERSE SHOULDER ARTHROPLASTY;  Surgeon: Netta Cedars, MD;  Location: [facility];  Service: Orthopedics;  Laterality: Right;  . THYROIDECTOMY    . TUBAL LIGATION  1986     reports that she has never smoked. She has never used smokeless tobacco. She reports that she does not drink alcohol or use drugs. Social History   Socioeconomic History  . Marital status: Married    Spouse name: Not on file  . Number of children: 3  . Years of education: Not on file  . Highest education level: Not on file  Occupational History  . Occupation: Self employed Armed forces operational officer  Social Needs  . Financial resource strain: Not on file  . Food insecurity:    Worry: Not on file    Inability: Not on file  . Transportation needs:    Medical: Not on file    Non-medical: Not on file  Tobacco Use  . Smoking status:  Never Smoker  . Smokeless tobacco: Never Used  Substance and Sexual Activity  . Alcohol use: No    Comment: wine occ  . Drug use: No    Types: Oxycodone    Comment: perscribed  . Sexual activity: Not Currently    Birth control/protection: None  Lifestyle  . Physical activity:    Days per week: Not on file    Minutes per session: Not on file  . Stress: Not on file  Relationships  . Social connections:    Talks on phone: Not on file    Gets together: Not on file    Attends religious service: Not on file    Active member of club or organization: Not on file    Attends meetings of clubs or organizations: Not on file    Relationship status: Not on file  . Intimate partner  violence:    Fear of current or ex partner: Not on file    Emotionally abused: Not on file    Physically abused: Not on file    Forced sexual activity: Not on file  Other Topics Concern  . Not on file  Social History Narrative   ** Merged History Encounter **        Pertinent  Health Maintenance Due  Topic Date Due  . PAP SMEAR-Modifier  06/07/1973  . PNA vac Low Risk Adult (1 of 2 - PCV13) 06/07/2017  . INFLUENZA VACCINE  09/20/2017  . MAMMOGRAM  12/12/2019  . COLONOSCOPY  04/10/2024  . DEXA SCAN  Completed    Medications: Allergies as of 04/05/2018      Reactions   Norco [hydrocodone-acetaminophen] Itching      Medication List       Accurate as of April 05, 2018  3:55 PM. Always use your most recent med list.        acetaminophen 650 MG CR tablet Commonly known as:  TYLENOL Take 1,300 mg by mouth 3 (three) times daily. At 6 am, 2 pm, and 10 pm   apixaban 5 MG Tabs tablet Commonly known as:  ELIQUIS Take 1 tablet (5 mg total) by mouth 2 (two) times daily.   calcitRIOL 0.5 MCG capsule Commonly known as:  ROCALTROL TAKE 1 CAPSULE BY MOUTH 2 TIMES DAILY.   calcium carbonate 1250 (500 Ca) MG tablet Commonly known as:  OS-CAL - dosed in mg of elemental calcium Take 2 tablets (1,000 mg of elemental calcium total) by mouth 2 (two) times daily.   carvedilol 3.125 MG tablet Commonly known as:  COREG Take 1 tablet (3.125 mg total) by mouth 2 (two) times daily with a meal.   cyanocobalamin 1000 MCG/ML injection Commonly known as:  (VITAMIN B-12) Inject 1 mL (1,000 mcg total) into the muscle every 30 (thirty) days.   cyclobenzaprine 10 MG tablet Commonly known as:  FLEXERIL Take 2 tablets (20 mg total) by mouth 2 (two) times daily.   diclofenac sodium 1 % Gel Commonly known as:  VOLTAREN Apply 2 g topically 2 (two) times daily as needed (for knee pain).   DULoxetine 60 MG capsule Commonly known as:  CYMBALTA Take 1 capsule (60 mg total) by mouth daily.     eszopiclone 2 MG Tabs tablet Commonly known as:  LUNESTA Take 1 tablet (2 mg total) by mouth at bedtime.   furosemide 20 MG tablet Commonly known as:  LASIX Take 1 tablet (20 mg total) by mouth daily.   levothyroxine 200 MCG tablet Commonly known as:  SYNTHROID,  LEVOTHROID Take 1 tablet (200 mcg total) by mouth daily before breakfast.   LYRICA 200 MG capsule Generic drug:  pregabalin Take 1 capsule (200 mg total) by mouth 2 (two) times daily.   magnesium oxide 400 MG tablet Commonly known as:  MAG-OX Take 1 tablet (400 mg total) by mouth daily.   methocarbamol 500 MG tablet Commonly known as:  ROBAXIN Take 1 tablet (500 mg total) by mouth 3 (three) times daily as needed.   mirabegron ER 50 MG Tb24 tablet Commonly known as:  MYRBETRIQ Take 1 tablet (50 mg total) by mouth daily.   oxycodone 5 MG capsule Commonly known as:  OXY-IR Take 1 capsule (5 mg total) by mouth 2 (two) times daily. At 10 am and 6 pm   senna 8.6 MG Tabs tablet Commonly known as:  SENOKOT Take 1 tablet (8.6 mg total) by mouth daily.   traZODone 50 MG tablet Commonly known as:  DESYREL Take 1 tablet (50 mg total) by mouth at bedtime.   Vitamin D (Ergocalciferol) 1.25 MG (50000 UT) Caps capsule Commonly known as:  DRISDOL Take 1 capsule (50,000 Units total) by mouth every Wednesday. Start taking on:  April 10, 2018        Vitals:   04/05/18 1549  BP: 92/63  Pulse: (!) 56  Resp: 18  Temp: 97.6 F (36.4 C)  Weight: (!) 304 lb (137.9 kg)  Height: 5\' 5"  (1.651 m)   Body mass index is 50.59 kg/m.  Physical Exam  GENERAL APPEARANCE: Alert, conversant. No acute distress.  HEENT: Unremarkable. RESPIRATORY: Breathing is even, unlabored. Lung sounds are clear   CARDIOVASCULAR: Heart RRR no murmurs, rubs or gallops. No peripheral edema.  GASTROINTESTINAL: Abdomen is soft, non-tender, not distended w/ normal bowel sounds.  NEUROLOGIC: Cranial nerves 2-12 grossly intact. Moves all  extremities   Labs reviewed: Basic Metabolic Panel: Recent Labs    07/10/17 2104  07/15/17 0239  08/03/17 1205  11/27/17 1029  03/25/18 2109 03/29/18 0645 03/30/18 0401  NA  --    < > 140   < > 143   < > 142   < > 143 140 139  K  --    < > 4.3   < > 4.5   < > 4.4   < > 4.6 4.5 5.0  CL  --    < > 99*   < > 101   < > 101   < > 105 101 98  CO2  --    < > 34*   < > 33*   < > 30   < > 28 27 30   GLUCOSE  --    < > 84   < > 87   < > 81   < > 85 88 113*  BUN  --    < > 20   < > 45*   < > 29*   < > 24* 20 15  CREATININE  --    < > 0.98   < > 1.58*   < > 1.01*   < > 0.93 0.99 0.90  CALCIUM 5.5*   < > 6.2*   < > 5.7*   < > 8.9   < > 7.1* 7.7* 6.9*  MG 1.2*   < > 2.1  --  1.5  --  1.6  --   --   --   --   PHOS 7.4*  --   --   --  6.3*  --  3.8  --   --   --   --    < > = values in this interval not displayed.   No results found for: [facility] Liver Function Tests: Recent Labs    07/11/17 0219 07/14/17 0039 09/21/17 1819  AST 16 16 19   ALT 9* 8* 12  ALKPHOS 93 85 77  BILITOT 0.7 0.6 0.7  PROT 6.6 5.7* 7.0  ALBUMIN 3.1* 2.6* 3.7   No results for input(s): LIPASE, AMYLASE in the last 8760 hours. No results for input(s): AMMONIA in the last 8760 hours. CBC: Recent Labs    07/11/17 0219  09/21/17 1819 01/03/18 1406 03/21/18 1346 03/25/18 2109 03/30/18 0401  WBC 5.3   < > 5.3 3.9* 3.9* 5.0  --   NEUTROABS 3.1  --  3.0  --   --  3.0  --   HGB 9.8*   < > 11.1* 12.8 11.0* 10.6* 9.8*  HCT 33.3*   < > 36.4 43.9 36.5 35.0* 32.7*  MCV 85.2   < > 89.2 89.4 93.1 94.1  --   PLT 131*   < > 202 161 152 164  --    < > = values in this interval not displayed.   Lipid No results for input(s): CHOL, HDL, LDLCALC, TRIG in the last 8760 hours. Cardiac Enzymes: Recent Labs    09/21/17 1819 09/21/17 2229  TROPONINI <0.03 <0.03   BNP: Recent Labs    07/10/17 1700 09/18/17 1454 09/21/17 1819  BNP 246.2* 136.1* 124.6*   CBG: Recent Labs    03/29/18 2338  GLUCAP 121*     Procedures and Imaging Studies During Stay: Dg Shoulder Right Port  Result Date: 03/29/2018 CLINICAL DATA:  Right shoulder replacement. EXAM: PORTABLE RIGHT SHOULDER COMPARISON:  Right shoulder x-rays dated October 11, 2017. FINDINGS: Interval reverse total shoulder arthroplasty. No evidence of hardware failure or loosening. Expected postsurgical changes in the soft tissues. Unchanged moderate acromioclavicular osteoarthritis. Right basilar atelectasis. IMPRESSION: 1. Interval reverse total shoulder arthroplasty without acute postoperative complication. Electronically Signed   By: Titus Dubin M.D.   On: 03/29/2018 11:08    Assessment/Plan:   No diagnosis found.   Patient is being discharged with the following home health services:    Patient is being discharged with the following durable medical equipment:    Patient has been advised to f/u with their PCP in 1-2 weeks to bring them up to date on their rehab stay.  Social services at facility was responsible for arranging this appointment.  Pt was provided with a 30 day supply of prescriptions for medications and refills must be obtained from their PCP.  For controlled substances, a more limited supply may be provided adequate until PCP appointment only.  Future labs/tests needed:   Jillian Delaine. Sheppard Coil, MD This encounter was created in error - please disregard.

## 2018-04-06 ENCOUNTER — Encounter: Payer: Self-pay | Admitting: Internal Medicine

## 2018-04-12 ENCOUNTER — Other Ambulatory Visit: Payer: Self-pay | Admitting: Internal Medicine

## 2018-04-15 DIAGNOSIS — K5900 Constipation, unspecified: Secondary | ICD-10-CM | POA: Diagnosis not present

## 2018-04-15 DIAGNOSIS — I5032 Chronic diastolic (congestive) heart failure: Secondary | ICD-10-CM | POA: Diagnosis not present

## 2018-04-15 DIAGNOSIS — M797 Fibromyalgia: Secondary | ICD-10-CM | POA: Diagnosis not present

## 2018-04-15 DIAGNOSIS — D649 Anemia, unspecified: Secondary | ICD-10-CM | POA: Diagnosis not present

## 2018-04-16 DIAGNOSIS — Z4789 Encounter for other orthopedic aftercare: Secondary | ICD-10-CM | POA: Diagnosis not present

## 2018-04-23 ENCOUNTER — Encounter: Payer: Self-pay | Admitting: Internal Medicine

## 2018-04-23 DIAGNOSIS — D649 Anemia, unspecified: Secondary | ICD-10-CM | POA: Diagnosis not present

## 2018-04-23 DIAGNOSIS — I5032 Chronic diastolic (congestive) heart failure: Secondary | ICD-10-CM | POA: Diagnosis not present

## 2018-04-23 DIAGNOSIS — M797 Fibromyalgia: Secondary | ICD-10-CM | POA: Diagnosis not present

## 2018-04-24 NOTE — Telephone Encounter (Signed)
Patient is due for Prolia after 07/05/18

## 2018-04-25 ENCOUNTER — Encounter: Payer: Self-pay | Admitting: Internal Medicine

## 2018-04-25 DIAGNOSIS — I5033 Acute on chronic diastolic (congestive) heart failure: Secondary | ICD-10-CM | POA: Diagnosis not present

## 2018-04-25 DIAGNOSIS — I10 Essential (primary) hypertension: Secondary | ICD-10-CM | POA: Diagnosis not present

## 2018-04-25 DIAGNOSIS — M797 Fibromyalgia: Secondary | ICD-10-CM | POA: Diagnosis not present

## 2018-04-25 DIAGNOSIS — Z96611 Presence of right artificial shoulder joint: Secondary | ICD-10-CM | POA: Diagnosis not present

## 2018-04-29 DIAGNOSIS — I5032 Chronic diastolic (congestive) heart failure: Secondary | ICD-10-CM | POA: Diagnosis not present

## 2018-04-29 DIAGNOSIS — Z471 Aftercare following joint replacement surgery: Secondary | ICD-10-CM | POA: Diagnosis not present

## 2018-04-29 DIAGNOSIS — D649 Anemia, unspecified: Secondary | ICD-10-CM | POA: Diagnosis not present

## 2018-05-01 ENCOUNTER — Other Ambulatory Visit: Payer: Self-pay | Admitting: Internal Medicine

## 2018-05-01 DIAGNOSIS — J45909 Unspecified asthma, uncomplicated: Secondary | ICD-10-CM | POA: Diagnosis not present

## 2018-05-01 DIAGNOSIS — I110 Hypertensive heart disease with heart failure: Secondary | ICD-10-CM | POA: Diagnosis not present

## 2018-05-01 DIAGNOSIS — Z96611 Presence of right artificial shoulder joint: Secondary | ICD-10-CM | POA: Diagnosis not present

## 2018-05-01 DIAGNOSIS — F329 Major depressive disorder, single episode, unspecified: Secondary | ICD-10-CM | POA: Diagnosis not present

## 2018-05-01 DIAGNOSIS — I5032 Chronic diastolic (congestive) heart failure: Secondary | ICD-10-CM | POA: Diagnosis not present

## 2018-05-01 DIAGNOSIS — I272 Pulmonary hypertension, unspecified: Secondary | ICD-10-CM | POA: Diagnosis not present

## 2018-05-01 DIAGNOSIS — I4891 Unspecified atrial fibrillation: Secondary | ICD-10-CM | POA: Diagnosis not present

## 2018-05-01 DIAGNOSIS — M810 Age-related osteoporosis without current pathological fracture: Secondary | ICD-10-CM | POA: Diagnosis not present

## 2018-05-01 DIAGNOSIS — M797 Fibromyalgia: Secondary | ICD-10-CM | POA: Diagnosis not present

## 2018-05-01 DIAGNOSIS — Z7901 Long term (current) use of anticoagulants: Secondary | ICD-10-CM | POA: Diagnosis not present

## 2018-05-01 DIAGNOSIS — Z471 Aftercare following joint replacement surgery: Secondary | ICD-10-CM | POA: Diagnosis not present

## 2018-05-01 DIAGNOSIS — Z6841 Body Mass Index (BMI) 40.0 and over, adult: Secondary | ICD-10-CM | POA: Diagnosis not present

## 2018-05-02 DIAGNOSIS — Z471 Aftercare following joint replacement surgery: Secondary | ICD-10-CM | POA: Diagnosis not present

## 2018-05-02 DIAGNOSIS — I5032 Chronic diastolic (congestive) heart failure: Secondary | ICD-10-CM | POA: Diagnosis not present

## 2018-05-02 DIAGNOSIS — M797 Fibromyalgia: Secondary | ICD-10-CM | POA: Diagnosis not present

## 2018-05-02 DIAGNOSIS — Z96611 Presence of right artificial shoulder joint: Secondary | ICD-10-CM | POA: Diagnosis not present

## 2018-05-02 DIAGNOSIS — I110 Hypertensive heart disease with heart failure: Secondary | ICD-10-CM | POA: Diagnosis not present

## 2018-05-02 DIAGNOSIS — I4891 Unspecified atrial fibrillation: Secondary | ICD-10-CM | POA: Diagnosis not present

## 2018-05-03 ENCOUNTER — Ambulatory Visit: Payer: Medicare Other | Admitting: Podiatry

## 2018-05-03 ENCOUNTER — Other Ambulatory Visit: Payer: Self-pay

## 2018-05-03 ENCOUNTER — Ambulatory Visit (INDEPENDENT_AMBULATORY_CARE_PROVIDER_SITE_OTHER): Payer: Medicare Other | Admitting: Podiatry

## 2018-05-03 ENCOUNTER — Encounter: Payer: Self-pay | Admitting: Podiatry

## 2018-05-03 VITALS — BP 110/61

## 2018-05-03 DIAGNOSIS — Z9229 Personal history of other drug therapy: Secondary | ICD-10-CM | POA: Diagnosis not present

## 2018-05-03 DIAGNOSIS — M79675 Pain in left toe(s): Secondary | ICD-10-CM

## 2018-05-03 DIAGNOSIS — M797 Fibromyalgia: Secondary | ICD-10-CM | POA: Diagnosis not present

## 2018-05-03 DIAGNOSIS — B351 Tinea unguium: Secondary | ICD-10-CM | POA: Diagnosis not present

## 2018-05-03 DIAGNOSIS — M79674 Pain in right toe(s): Secondary | ICD-10-CM

## 2018-05-03 DIAGNOSIS — B359 Dermatophytosis, unspecified: Secondary | ICD-10-CM

## 2018-05-03 DIAGNOSIS — I4891 Unspecified atrial fibrillation: Secondary | ICD-10-CM | POA: Diagnosis not present

## 2018-05-03 DIAGNOSIS — Z96611 Presence of right artificial shoulder joint: Secondary | ICD-10-CM | POA: Diagnosis not present

## 2018-05-03 DIAGNOSIS — L84 Corns and callosities: Secondary | ICD-10-CM

## 2018-05-03 DIAGNOSIS — I5032 Chronic diastolic (congestive) heart failure: Secondary | ICD-10-CM | POA: Diagnosis not present

## 2018-05-03 DIAGNOSIS — Z471 Aftercare following joint replacement surgery: Secondary | ICD-10-CM | POA: Diagnosis not present

## 2018-05-03 DIAGNOSIS — I110 Hypertensive heart disease with heart failure: Secondary | ICD-10-CM | POA: Diagnosis not present

## 2018-05-03 NOTE — Patient Instructions (Signed)

## 2018-05-06 DIAGNOSIS — D509 Iron deficiency anemia, unspecified: Secondary | ICD-10-CM | POA: Diagnosis not present

## 2018-05-06 DIAGNOSIS — Z471 Aftercare following joint replacement surgery: Secondary | ICD-10-CM | POA: Diagnosis not present

## 2018-05-06 DIAGNOSIS — I890 Lymphedema, not elsewhere classified: Secondary | ICD-10-CM | POA: Diagnosis not present

## 2018-05-06 DIAGNOSIS — M1712 Unilateral primary osteoarthritis, left knee: Secondary | ICD-10-CM | POA: Diagnosis not present

## 2018-05-06 DIAGNOSIS — I5032 Chronic diastolic (congestive) heart failure: Secondary | ICD-10-CM | POA: Diagnosis not present

## 2018-05-06 DIAGNOSIS — M12811 Other specific arthropathies, not elsewhere classified, right shoulder: Secondary | ICD-10-CM | POA: Diagnosis not present

## 2018-05-06 DIAGNOSIS — E890 Postprocedural hypothyroidism: Secondary | ICD-10-CM | POA: Diagnosis not present

## 2018-05-06 DIAGNOSIS — Z9884 Bariatric surgery status: Secondary | ICD-10-CM | POA: Diagnosis not present

## 2018-05-06 DIAGNOSIS — I4891 Unspecified atrial fibrillation: Secondary | ICD-10-CM | POA: Diagnosis not present

## 2018-05-06 DIAGNOSIS — Z96611 Presence of right artificial shoulder joint: Secondary | ICD-10-CM | POA: Diagnosis not present

## 2018-05-06 DIAGNOSIS — M797 Fibromyalgia: Secondary | ICD-10-CM | POA: Diagnosis not present

## 2018-05-06 DIAGNOSIS — D519 Vitamin B12 deficiency anemia, unspecified: Secondary | ICD-10-CM | POA: Diagnosis not present

## 2018-05-06 DIAGNOSIS — I10 Essential (primary) hypertension: Secondary | ICD-10-CM | POA: Diagnosis not present

## 2018-05-06 DIAGNOSIS — I110 Hypertensive heart disease with heart failure: Secondary | ICD-10-CM | POA: Diagnosis not present

## 2018-05-07 ENCOUNTER — Encounter: Payer: Self-pay | Admitting: Podiatry

## 2018-05-07 DIAGNOSIS — I5032 Chronic diastolic (congestive) heart failure: Secondary | ICD-10-CM | POA: Diagnosis not present

## 2018-05-07 DIAGNOSIS — I4891 Unspecified atrial fibrillation: Secondary | ICD-10-CM | POA: Diagnosis not present

## 2018-05-07 DIAGNOSIS — I110 Hypertensive heart disease with heart failure: Secondary | ICD-10-CM | POA: Diagnosis not present

## 2018-05-07 DIAGNOSIS — M797 Fibromyalgia: Secondary | ICD-10-CM | POA: Diagnosis not present

## 2018-05-07 DIAGNOSIS — Z471 Aftercare following joint replacement surgery: Secondary | ICD-10-CM | POA: Diagnosis not present

## 2018-05-07 DIAGNOSIS — Z96611 Presence of right artificial shoulder joint: Secondary | ICD-10-CM | POA: Diagnosis not present

## 2018-05-07 MED ORDER — NONFORMULARY OR COMPOUNDED ITEM: 11 refills | Status: DC

## 2018-05-07 MED ORDER — CLOTRIMAZOLE 1 % EX CREA: TOPICAL_CREAM | Freq: Two times a day (BID) | CUTANEOUS | Status: DC

## 2018-05-07 NOTE — Telephone Encounter (Signed)
Faxed orders for Antifungal Cream ot

## 2018-05-08 DIAGNOSIS — D519 Vitamin B12 deficiency anemia, unspecified: Secondary | ICD-10-CM | POA: Diagnosis not present

## 2018-05-08 DIAGNOSIS — E890 Postprocedural hypothyroidism: Secondary | ICD-10-CM | POA: Diagnosis not present

## 2018-05-08 DIAGNOSIS — I110 Hypertensive heart disease with heart failure: Secondary | ICD-10-CM | POA: Diagnosis not present

## 2018-05-08 DIAGNOSIS — Z9884 Bariatric surgery status: Secondary | ICD-10-CM | POA: Diagnosis not present

## 2018-05-08 DIAGNOSIS — Z471 Aftercare following joint replacement surgery: Secondary | ICD-10-CM | POA: Diagnosis not present

## 2018-05-08 DIAGNOSIS — I5032 Chronic diastolic (congestive) heart failure: Secondary | ICD-10-CM | POA: Diagnosis not present

## 2018-05-08 DIAGNOSIS — I4891 Unspecified atrial fibrillation: Secondary | ICD-10-CM | POA: Diagnosis not present

## 2018-05-08 DIAGNOSIS — Z96611 Presence of right artificial shoulder joint: Secondary | ICD-10-CM | POA: Diagnosis not present

## 2018-05-08 DIAGNOSIS — M797 Fibromyalgia: Secondary | ICD-10-CM | POA: Diagnosis not present

## 2018-05-08 DIAGNOSIS — D509 Iron deficiency anemia, unspecified: Secondary | ICD-10-CM | POA: Diagnosis not present

## 2018-05-09 DIAGNOSIS — I5032 Chronic diastolic (congestive) heart failure: Secondary | ICD-10-CM | POA: Diagnosis not present

## 2018-05-09 DIAGNOSIS — I110 Hypertensive heart disease with heart failure: Secondary | ICD-10-CM | POA: Diagnosis not present

## 2018-05-09 DIAGNOSIS — Z471 Aftercare following joint replacement surgery: Secondary | ICD-10-CM | POA: Diagnosis not present

## 2018-05-09 DIAGNOSIS — M797 Fibromyalgia: Secondary | ICD-10-CM | POA: Diagnosis not present

## 2018-05-09 DIAGNOSIS — Z96611 Presence of right artificial shoulder joint: Secondary | ICD-10-CM | POA: Diagnosis not present

## 2018-05-09 DIAGNOSIS — I4891 Unspecified atrial fibrillation: Secondary | ICD-10-CM | POA: Diagnosis not present

## 2018-05-10 ENCOUNTER — Encounter: Payer: Self-pay | Admitting: Internal Medicine

## 2018-05-10 NOTE — Progress Notes (Signed)
Reviewed labs from Dr. Maia Petties, patient's PCP, drawn on 05/08/2018: -CMP: Glucose 29 (!),  BUN/creatinine 19/0.94, GFR 74, calcium 8.3 (8.7-10.3), Albumin 3.6 (3.8-4.8) -Vitamin D 39.8 -TSH 2.87 -Phosphorus 3.6 (3-4.3) -Magnesium 1.6 (1.6-2.3) -Ferritin 29 (15-150), hemoglobin 11.1 (11.1-15.9)  Corrected calcium: 8.62, slightly lower than normal, which is our target. I am surprised by her very low glucose (I am not sure if this is correct or just a lab artifact).  She does not have a history of diabetes but has steroid-induced hyperglycemia...  She is not on medications for this.

## 2018-05-13 DIAGNOSIS — I4891 Unspecified atrial fibrillation: Secondary | ICD-10-CM | POA: Diagnosis not present

## 2018-05-13 DIAGNOSIS — M797 Fibromyalgia: Secondary | ICD-10-CM | POA: Diagnosis not present

## 2018-05-13 DIAGNOSIS — Z96611 Presence of right artificial shoulder joint: Secondary | ICD-10-CM | POA: Diagnosis not present

## 2018-05-13 DIAGNOSIS — Z471 Aftercare following joint replacement surgery: Secondary | ICD-10-CM | POA: Diagnosis not present

## 2018-05-13 DIAGNOSIS — I110 Hypertensive heart disease with heart failure: Secondary | ICD-10-CM | POA: Diagnosis not present

## 2018-05-13 DIAGNOSIS — I5032 Chronic diastolic (congestive) heart failure: Secondary | ICD-10-CM | POA: Diagnosis not present

## 2018-05-14 DIAGNOSIS — M797 Fibromyalgia: Secondary | ICD-10-CM | POA: Diagnosis not present

## 2018-05-14 DIAGNOSIS — I4891 Unspecified atrial fibrillation: Secondary | ICD-10-CM | POA: Diagnosis not present

## 2018-05-14 DIAGNOSIS — Z471 Aftercare following joint replacement surgery: Secondary | ICD-10-CM | POA: Diagnosis not present

## 2018-05-14 DIAGNOSIS — Z96611 Presence of right artificial shoulder joint: Secondary | ICD-10-CM | POA: Diagnosis not present

## 2018-05-14 DIAGNOSIS — I5032 Chronic diastolic (congestive) heart failure: Secondary | ICD-10-CM | POA: Diagnosis not present

## 2018-05-14 DIAGNOSIS — I110 Hypertensive heart disease with heart failure: Secondary | ICD-10-CM | POA: Diagnosis not present

## 2018-05-14 DIAGNOSIS — Z4789 Encounter for other orthopedic aftercare: Secondary | ICD-10-CM | POA: Diagnosis not present

## 2018-05-15 DIAGNOSIS — I5032 Chronic diastolic (congestive) heart failure: Secondary | ICD-10-CM | POA: Diagnosis not present

## 2018-05-15 DIAGNOSIS — Z96611 Presence of right artificial shoulder joint: Secondary | ICD-10-CM | POA: Diagnosis not present

## 2018-05-15 DIAGNOSIS — Z471 Aftercare following joint replacement surgery: Secondary | ICD-10-CM | POA: Diagnosis not present

## 2018-05-15 DIAGNOSIS — I4891 Unspecified atrial fibrillation: Secondary | ICD-10-CM | POA: Diagnosis not present

## 2018-05-15 DIAGNOSIS — I110 Hypertensive heart disease with heart failure: Secondary | ICD-10-CM | POA: Diagnosis not present

## 2018-05-15 DIAGNOSIS — M797 Fibromyalgia: Secondary | ICD-10-CM | POA: Diagnosis not present

## 2018-05-15 NOTE — Progress Notes (Signed)
Subjective: Jillian Eaton presents today with cc of painful, discolored, thick toenails which interfere with daily activities. Duration greater than 1 month.  Pain is aggravated when wearing enclosed shoe gear.   Her husband is a patient of Dr. Amalia Hailey.  She is on blood thinner, Eliquis.  She takes Lyrica for fibromyalgia.  She relates numbness in feet and states at times it feels like a vice  grip in on squeezing her feet.  She feels she can walk long distances but for the pain in her knees, left greater than right.  Past Medical History:  Diagnosis Date  . Anemia   . Asthma    flare up last yr  . CHF (congestive heart failure) ([place])   . Chronic headache   . DOE (dyspnea on exertion)    2D ECHO, 02/12/2012 - EF 60-65%, moderate concentric hypertrophy  . Fibromyalgia    nerve pain"left side at waist level" "can't lay on that side without pain" , "HOB elevation helps"  . Heart murmur   . Hematuria - cause not known   . History of kidney stones    x 2 '13, '14 surgery to remove  . Hypertension   . SBO (small bowel obstruction) ([place]) 06/07/2013  . Sleep apnea    tested several times and came up negative  . Thyroid disease    "goiter"  . Transfusion history    10 yrs+     Patient Active Problem List   Diagnosis Date Noted  . Rotator cuff tear arthropathy of right shoulder 04/05/2018  . Overactive bladder 04/05/2018  . Insomnia 04/05/2018  . Steroid-induced hyperglycemia   . Acute blood loss as cause of postoperative anemia   . Generalized OA   . Super obese   . 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 NICM (nonischemic cardiomyopathy) ([place]) 01/15/2018  . Osteoporosis 12/13/2017  . Chronic diastolic CHF (congestive heart failure) ([place]) 09/28/2017  . 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 Hypercapnemia 07/13/2017  . AKI (acute kidney injury) ([place]) 07/10/2017  . On anticoagulant therapy 06/19/2017  . GERD (gastroesophageal reflux disease)  08/28/2016  . 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 Depression 08/28/2016  . Iatrogenic hypocalcemia 08/28/2016  . Chronic pain syndrome   . Fibromyalgia   . Asthma   . Acute diastolic (congestive) heart failure ([place])   . Atrial fibrillation ([place]) 04/28/2016  . Vocal cord dysfunction 04/28/2016  . Thrombocytopenia ([place]) 04/28/2016  . Seizures ([place])   . Preoperative cardiovascular examination   . Leg swelling 10/19/2015  . Acute on chronic respiratory failure with hypoxia and hypercapnia ([place]) 08/03/2015  . Bilateral leg edema 05/11/2015  . Essential hypertension 05/11/2015  . Pulmonary hypertension ([place]) 01/08/2013  . Anemia of chronic disease 03/28/2011  . Morbid obesity ([place]) 09/22/2010     Past Surgical History:  Procedure Laterality Date  . CARDIAC CATHETERIZATION  04/04/2010   No significant obstructive coronary artery disease  . CARDIOVERSION N/A 08/08/2017   Procedure: CARDIOVERSION;  Surgeon: Pixie Casino, MD;  Location: [facility];  Service: Cardiovascular;  Laterality: N/A;  . CHOLECYSTECTOMY  1990  . COLONOSCOPY W/ POLYPECTOMY    . COLONOSCOPY WITH PROPOFOL N/A 04/10/2014   Procedure: COLONOSCOPY WITH PROPOFOL;  Surgeon: Beryle Beams, MD;  Location: [facility];  Service: Endoscopy;  Laterality: N/A;  . DIAGNOSTIC LAPAROSCOPY     x2 bowel obstructions(adhesions)  . EYE SURGERY     lasik 20-25 yrs ago  . 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 REVERSE SHOULDER ARTHROPLASTY Right 03/29/2018   Procedure: REVERSE SHOULDER ARTHROPLASTY;  Surgeon: Netta Cedars, MD;  Location: [facility];  Service: Orthopedics;  Laterality: Right;  . THYROIDECTOMY    . TUBAL LIGATION  1986      Current Outpatient Medications:  .  acetaminophen (TYLENOL) 650 MG CR tablet, Take 1,300 mg by mouth 3 (three) times daily. At 6 am, 2 pm, and 10 pm, Disp: , Rfl:  .  apixaban (ELIQUIS) 5 MG TABS tablet, Take 1 tablet (5 mg total) by mouth 2 (two)  times daily., Disp: 60 tablet, Rfl: 0 .  calcitRIOL (ROCALTROL) 0.5 MCG capsule, TAKE 1 CAPSULE BY MOUTH 2 TIMES DAILY., Disp: 180 capsule, Rfl: 0 .  calcium carbonate (OS-CAL - DOSED IN MG OF ELEMENTAL CALCIUM) 1250 (500 Ca) MG tablet, Take 2 tablets (1,000 mg of elemental calcium total) by mouth 2 (two) times daily., Disp: 120 tablet, Rfl: 0 .  carvedilol (COREG) 3.125 MG tablet, Take 1 tablet (3.125 mg total) by mouth 2 (two) times daily with a meal., Disp: 60 tablet, Rfl: 0 .  cyanocobalamin (,VITAMIN B-12,) 1000 MCG/ML injection, Inject 1 mL (1,000 mcg total) into the muscle every 30 (thirty) days., Disp: 1 mL, Rfl: 0 .  cyclobenzaprine (FLEXERIL) 10 MG tablet, Take 2 tablets (20 mg total) by mouth 2 (two) times daily., Disp: 80 tablet, Rfl: 0 .  diclofenac sodium (VOLTAREN) 1 % GEL, Apply 2 g topically 2 (two) times daily as needed (for knee pain)., Disp: 100 g, Rfl: 0 .  DULoxetine (CYMBALTA) 60 MG capsule, Take 1 capsule (60 mg total) by mouth daily., Disp: 30 capsule, Rfl: 0 .  eszopiclone (LUNESTA) 2 MG TABS tablet, Take 1 tablet (2 mg total) by mouth at bedtime., Disp: 20 tablet, Rfl: 0 .  furosemide (LASIX) 20 MG tablet, Take 1 tablet (20 mg total) by mouth daily., Disp: 30 tablet, Rfl: 0 .  levothyroxine (SYNTHROID, LEVOTHROID) 200 MCG tablet, Take 1 tablet (200 mcg total) by mouth daily before breakfast., Disp: 30 tablet, Rfl: 0 .  LYRICA 200 MG capsule, Take 1 capsule (200 mg total) by mouth 2 (two) times daily., Disp: 40 capsule, Rfl: 0 .  magnesium oxide (MAG-OX) 400 MG tablet, Take 1 tablet (400 mg total) by mouth daily., Disp: 30 tablet, Rfl: 0 .  methocarbamol (ROBAXIN) 500 MG tablet, Take 1 tablet (500 mg total) by mouth 3 (three) times daily as needed., Disp: 60 tablet, Rfl: 0 .  mirabegron ER (MYRBETRIQ) 50 MG TB24 tablet, Take 1 tablet (50 mg total) by mouth daily., Disp: 30 tablet, Rfl: 0 .  oxycodone (OXY-IR) 5 MG capsule, Take 1 capsule (5 mg total) by mouth 2 (two) times  daily. At 10 am and 6 pm, Disp: 20 capsule, Rfl: 0 .  senna (SENOKOT) 8.6 MG TABS tablet, Take 1 tablet (8.6 mg total) by mouth daily., Disp: 120 each, Rfl: 0 .  traZODone (DESYREL) 50 MG tablet, Take 1 tablet (50 mg total) by mouth at bedtime., Disp: 30 tablet, Rfl: 0 .  Vitamin D, Ergocalciferol, (DRISDOL) 1.25 MG (50000 UT) CAPS capsule, Take 1 capsule (50,000 Units total) by mouth every Wednesday., Disp: 4 capsule, Rfl: 0 .  FERREX 150 150 MG capsule, Take 150 mg by mouth 2 (two) times daily., Disp: , Rfl:  .  NONFORMULARY OR COMPOUNDED ITEM, [facility]:  Antifungal Cream - Terbinafine 3%, Fluconazole 2%, Tea Tree Oil 5%, Urea 10%, Ibuprofen 2% in  DMSO Suspension #50ml. Apply to affected toenail(s) once daily (at bedtime) or twice daily., Disp: 30 each, Rfl: 11 .  traMADol (ULTRAM) 50 MG tablet, Take 50 mg by mouth every 6 (six) hours., Disp: , Rfl:   Current Facility-Administered Medications:  .  clotrimazole (LOTRIMIN) 1 % cream, , Topical, BID, [place], Stephani Police, DPM   Allergies  Allergen Reactions  . Norco [Hydrocodone-Acetaminophen] Itching     Social History   Occupational History  . Occupation: Self employed Armed forces operational officer  Tobacco Use  . Smoking status: Never Smoker  . Smokeless tobacco: Never Used  Substance and Sexual Activity  . Alcohol use: No    Comment: wine occ  . Drug use: No    Types: Oxycodone    Comment: perscribed  . Sexual activity: Not Currently    Birth control/protection: None     Family History  Problem Relation Age of Onset  . Diabetes Mother   . Epilepsy Mother   . Cancer Mother        Breast  . Hypertension Mother   . Breast cancer Mother   . Kidney disease Father   . Diabetes Father   . Hypertension Father   . Asthma Father   . Heart disease Father   . Epilepsy Sister   . Cancer Maternal Grandmother   . Breast cancer Maternal Grandmother   . Cancer Paternal Grandmother   . Breast cancer Paternal Grandmother       Immunization History  Administered Date(s) Administered  . Influenza,inj,quad, With Preservative 02/22/2016, 03/05/2017  . Influenza-Unspecified 11/19/2014, 03/21/2016  . Pneumococcal Polysaccharide-23 05/01/2016     Review of systems: Positive Findings in bold print.  Constitutional:  chills, fatigue, fever, sweats, weight change Communication: Optometrist, sign Ecologist, hand writing, iPad/Android device Head: headaches, head injury Eyes: changes in vision, eye pain, glaucoma, cataracts, macular degeneration, diplopia, glare,  light sensitivity, eyeglasses or contacts, blindness Ears nose mouth throat: hearing impaired, hearing aids,  ringing in ears, deaf, sign language,  vertigo,   nosebleeds,  rhinitis,  cold sores, snoring, swollen glands Cardiovascular: HTN, edema, arrhythmia, pacemaker in place, defibrillator in place, chest pain/tightness, chronic anticoagulation, blood clot, heart failure, MI Peripheral Vascular: leg cramps, varicose veins, blood clots, lymphedema, varicosities Respiratory:  difficulty breathing, denies congestion, SOB, wheezing, cough, emphysema, asthma Gastrointestinal: change in appetite or weight, abdominal pain, constipation, diarrhea, nausea, vomiting, vomiting blood, change in bowel habits, abdominal pain, jaundice, rectal bleeding, hemorrhoids, GERD Genitourinary:  nocturia,  pain on urination, polyuria,  blood in urine, Foley catheter, urinary urgency, ESRD on hemodialysis Musculoskeletal: amputation, cramping, stiff joints, painful joints, decreased joint motion, fractures, OA, gout, hemiplegia, paraplegia, uses cane, wheelchair bound, uses walker, uses rollator Skin: +changes in toenails, color change, dryness, itching, mole changes,  rash, wound(s) Neurological: headaches, numbness in feet, paresthesias in feet, burning in feet, fainting,  seizures, change in speech. denies headaches, memory problems/poor historian, cerebral palsy, weakness,  paralysis, CVA, TIA Endocrine: diabetes, hypothyroidism, hyperthyroidism,  goiter, dry mouth, flushing, heat intolerance,  cold intolerance,  excessive thirst, denies polyuria,  nocturia Hematological:  easy bleeding, excessive bleeding, easy bruising, enlarged lymph nodes, on long term blood thinner, history of past transusions Allergy/immunological:  hives, eczema, frequent infections, multiple drug allergies, seasonal allergies, transplant recipient, multiple food allergies Psychiatric:  anxiety, depression, mood disorder, suicidal ideations, hallucinations, insomnia  Objective: Vascular Examination: Capillary refill time immediate x 10 digits.  Dorsalis pedis pulses palpable b/l.   Posterior tibial pulses faintly palpable b/l.   No  digital hair x 10 digits.  Skin temperature gradient WNL b/l  Dermatological Examination: Skin with normal turgor, texture and tone b/l.  Toenails 1-5 b/l discolored, thick, dystrophic with subungual debris and pain with palpation to nailbeds due to thickness of nails.  Hyperkeratotic lesion dorsal PIPJ 5th digit b/l. No erythema, no edema, no drainage.  Diffuse scaling noted peripherally and plantarly b/l feet with mild foot odor.  No interdigital macerations.  No blisters, no weeping. No signs of secondary bacterial infection noted.  Musculoskeletal: Muscle strength 5/5 to all LE muscle groups.  Hammertoe deformity 2-5 b/l.  Neurological: Sensation intact with 10 gram monofilament.  Vibratory sensation intact.  Assessment: 1. Painful onychomycosis toenails 1-5 b/l  2. Corns b/l 5th digits 3. Pt on long term blood thinner  Plan: 1. Discussed onychomycosis and treatment options.  Literature dispensed on today. 2. Toenails 1-5 b/l were debrided in length and girth without iatrogenic bleeding. 3. Corn(s) pared b/l 5th digits utilizing sterile scalpel blade without incident. 4. Rx for Clotrimazole Cream 1% to be applied to both feet and  between toes bid x 4 weeks 5. Patient to continue soft, supportive shoe gear daily. 6. Patient to report any pedal injuries to medical professional immediately. 7. Follow up 3 months.  8. Patient/POA to call should there be a concern in the interim.

## 2018-05-20 ENCOUNTER — Encounter: Payer: Self-pay | Admitting: Podiatry

## 2018-05-20 DIAGNOSIS — Z471 Aftercare following joint replacement surgery: Secondary | ICD-10-CM | POA: Diagnosis not present

## 2018-05-20 DIAGNOSIS — M797 Fibromyalgia: Secondary | ICD-10-CM | POA: Diagnosis not present

## 2018-05-20 DIAGNOSIS — I5032 Chronic diastolic (congestive) heart failure: Secondary | ICD-10-CM | POA: Diagnosis not present

## 2018-05-20 DIAGNOSIS — I4891 Unspecified atrial fibrillation: Secondary | ICD-10-CM | POA: Diagnosis not present

## 2018-05-20 DIAGNOSIS — Z96611 Presence of right artificial shoulder joint: Secondary | ICD-10-CM | POA: Diagnosis not present

## 2018-05-20 DIAGNOSIS — I110 Hypertensive heart disease with heart failure: Secondary | ICD-10-CM | POA: Diagnosis not present

## 2018-05-21 DIAGNOSIS — Z471 Aftercare following joint replacement surgery: Secondary | ICD-10-CM | POA: Diagnosis not present

## 2018-05-21 DIAGNOSIS — I110 Hypertensive heart disease with heart failure: Secondary | ICD-10-CM | POA: Diagnosis not present

## 2018-05-21 DIAGNOSIS — I5032 Chronic diastolic (congestive) heart failure: Secondary | ICD-10-CM | POA: Diagnosis not present

## 2018-05-21 DIAGNOSIS — Z96611 Presence of right artificial shoulder joint: Secondary | ICD-10-CM | POA: Diagnosis not present

## 2018-05-21 DIAGNOSIS — M797 Fibromyalgia: Secondary | ICD-10-CM | POA: Diagnosis not present

## 2018-05-21 DIAGNOSIS — I4891 Unspecified atrial fibrillation: Secondary | ICD-10-CM | POA: Diagnosis not present

## 2018-05-21 MED ORDER — CLOTRIMAZOLE 1 % EX CREA: 1.0000 "application " | TOPICAL_CREAM | Freq: Two times a day (BID) | CUTANEOUS | 1 refills | Status: DC

## 2018-05-21 NOTE — Telephone Encounter (Signed)
Emailed note to pt her Lotrimin Cream had been sent to [facility].

## 2018-05-22 DIAGNOSIS — Z471 Aftercare following joint replacement surgery: Secondary | ICD-10-CM | POA: Diagnosis not present

## 2018-05-22 DIAGNOSIS — M797 Fibromyalgia: Secondary | ICD-10-CM | POA: Diagnosis not present

## 2018-05-22 DIAGNOSIS — Z96611 Presence of right artificial shoulder joint: Secondary | ICD-10-CM | POA: Diagnosis not present

## 2018-05-22 DIAGNOSIS — I5032 Chronic diastolic (congestive) heart failure: Secondary | ICD-10-CM | POA: Diagnosis not present

## 2018-05-22 DIAGNOSIS — I110 Hypertensive heart disease with heart failure: Secondary | ICD-10-CM | POA: Diagnosis not present

## 2018-05-22 DIAGNOSIS — I4891 Unspecified atrial fibrillation: Secondary | ICD-10-CM | POA: Diagnosis not present

## 2018-05-23 DIAGNOSIS — I5032 Chronic diastolic (congestive) heart failure: Secondary | ICD-10-CM | POA: Diagnosis not present

## 2018-05-23 DIAGNOSIS — I110 Hypertensive heart disease with heart failure: Secondary | ICD-10-CM | POA: Diagnosis not present

## 2018-05-23 DIAGNOSIS — I4891 Unspecified atrial fibrillation: Secondary | ICD-10-CM | POA: Diagnosis not present

## 2018-05-23 DIAGNOSIS — Z471 Aftercare following joint replacement surgery: Secondary | ICD-10-CM | POA: Diagnosis not present

## 2018-05-23 DIAGNOSIS — Z96611 Presence of right artificial shoulder joint: Secondary | ICD-10-CM | POA: Diagnosis not present

## 2018-05-23 DIAGNOSIS — M797 Fibromyalgia: Secondary | ICD-10-CM | POA: Diagnosis not present

## 2018-05-24 ENCOUNTER — Telehealth: Payer: Medicare Other | Admitting: Nurse Practitioner

## 2018-05-24 DIAGNOSIS — R6889 Other general symptoms and signs: Secondary | ICD-10-CM | POA: Diagnosis not present

## 2018-05-24 NOTE — Progress Notes (Signed)
E visit for Flu like symptoms   We are sorry that you are not feeling well.  Here is how we plan to help! Based on what you have shared with me it looks like you may have flu-like symptoms that should be watched but do not seem to indicate anti-viral treatment.  Influenza or "the flu" is   an infection caused by a respiratory virus. The flu virus is highly contagious and persons who did not receive their yearly flu vaccination may "catch" the flu from close contact.  We have anti-viral medications to treat the viruses that cause this infection. They are not a "cure" and only shorten the course of the infection. These prescriptions are most effective when they are given within the first 2 days of "flu" symptoms. Antiviral medication are indicated if you have a high risk of complications from the flu. You should  also consider an antiviral medication if you are in close contact with someone who is at risk. These medications can help patients avoid complications from the flu  but have side effects that you should know. Possible side effects from Tamiflu or oseltamivir include nausea, vomiting, diarrhea, dizziness, headaches, eye redness, sleep problems or other respiratory symptoms. You should not take Tamiflu if you have an allergy to oseltamivir or any to the ingredients in Tamiflu.  Based upon your symptoms and potential risk factors I recommend that you follow the flu symptoms recommendation that I have listed below.  ANYONE WHO HAS FLU SYMPTOMS SHOULD: . Stay home. The flu is highly contagious and going out or to work exposes others! . Be sure to drink plenty of fluids. Water is fine as well as fruit juices, sodas and electrolyte beverages. You may want to stay away from caffeine or alcohol. If you are nauseated, try taking small sips of liquids. How do you know if you are getting enough fluid? Your urine should be a pale yellow or almost colorless. . Get rest. . Taking a steamy shower or using a  humidifier may help nasal congestion and ease sore throat pain. Using a saline nasal spray works much the same way. . Cough drops, hard candies and sore throat lozenges may ease your cough. . Line up a caregiver. Have someone check on you regularly.   GET HELP RIGHT AWAY IF: . You cannot keep down liquids or your medications. . You become short of breath . Your fell like you are going to pass out or loose consciousness. . Your symptoms persist after you have completed your treatment plan MAKE SURE YOU   Understand these instructions.  Will watch your condition.  Will get help right away if you are not doing well or get worse.  Your e-visit answers were reviewed by a board certified advanced clinical practitioner to complete your personal care plan.  Depending on the condition, your plan could have included both over the counter or prescription medications.  If there is a problem please reply  once you have received a response from your provider.  Your safety is important to us.  If you have drug allergies check your prescription carefully.    You can use MyChart to ask questions about today's visit, request a non-urgent call back, or ask for a work or school excuse for 24 hours related to this e-Visit. If it has been greater than 24 hours you will need to follow up with your provider, or enter a new e-Visit to address those concerns.  You will get an e-mail   in the next two days asking about your experience.  I hope that your e-visit has been valuable and will speed your recovery. Thank you for using e-visits.  5 minutes spent reviewing and documenting in chart.  

## 2018-05-25 ENCOUNTER — Other Ambulatory Visit: Payer: Self-pay

## 2018-05-25 ENCOUNTER — Emergency Department (HOSPITAL_COMMUNITY): Payer: Medicare Other

## 2018-05-25 ENCOUNTER — Encounter (HOSPITAL_COMMUNITY): Payer: Self-pay

## 2018-05-25 ENCOUNTER — Emergency Department (HOSPITAL_COMMUNITY)
Admission: EM | Admit: 2018-05-25 | Discharge: 2018-05-25 | Disposition: A | Discharge status: Home / Self Care | Payer: Medicare Other | Attending: Emergency Medicine | Admitting: Emergency Medicine

## 2018-05-25 DIAGNOSIS — Z7901 Long term (current) use of anticoagulants: Secondary | ICD-10-CM | POA: Insufficient documentation

## 2018-05-25 DIAGNOSIS — R279 Unspecified lack of coordination: Secondary | ICD-10-CM | POA: Diagnosis not present

## 2018-05-25 DIAGNOSIS — Z743 Need for continuous supervision: Secondary | ICD-10-CM | POA: Diagnosis not present

## 2018-05-25 DIAGNOSIS — Z79899 Other long term (current) drug therapy: Secondary | ICD-10-CM | POA: Insufficient documentation

## 2018-05-25 DIAGNOSIS — I509 Heart failure, unspecified: Secondary | ICD-10-CM | POA: Diagnosis not present

## 2018-05-25 DIAGNOSIS — I5042 Chronic combined systolic (congestive) and diastolic (congestive) heart failure: Secondary | ICD-10-CM | POA: Diagnosis not present

## 2018-05-25 DIAGNOSIS — R569 Unspecified convulsions: Secondary | ICD-10-CM | POA: Diagnosis not present

## 2018-05-25 DIAGNOSIS — Z6841 Body Mass Index (BMI) 40.0 and over, adult: Secondary | ICD-10-CM | POA: Diagnosis not present

## 2018-05-25 DIAGNOSIS — E874 Mixed disorder of acid-base balance: Secondary | ICD-10-CM | POA: Diagnosis not present

## 2018-05-25 DIAGNOSIS — I517 Cardiomegaly: Secondary | ICD-10-CM | POA: Diagnosis not present

## 2018-05-25 DIAGNOSIS — R402342 Coma scale, best motor response, flexion withdrawal, at arrival to emergency department: Secondary | ICD-10-CM | POA: Diagnosis not present

## 2018-05-25 DIAGNOSIS — I63542 Cerebral infarction due to unspecified occlusion or stenosis of left cerebellar artery: Secondary | ICD-10-CM | POA: Diagnosis not present

## 2018-05-25 DIAGNOSIS — I480 Paroxysmal atrial fibrillation: Secondary | ICD-10-CM | POA: Diagnosis not present

## 2018-05-25 DIAGNOSIS — I6389 Other cerebral infarction: Secondary | ICD-10-CM | POA: Diagnosis not present

## 2018-05-25 DIAGNOSIS — I10 Essential (primary) hypertension: Secondary | ICD-10-CM | POA: Diagnosis not present

## 2018-05-25 DIAGNOSIS — I110 Hypertensive heart disease with heart failure: Secondary | ICD-10-CM | POA: Insufficient documentation

## 2018-05-25 DIAGNOSIS — R0902 Hypoxemia: Secondary | ICD-10-CM | POA: Diagnosis not present

## 2018-05-25 DIAGNOSIS — J209 Acute bronchitis, unspecified: Secondary | ICD-10-CM | POA: Diagnosis not present

## 2018-05-25 DIAGNOSIS — I5032 Chronic diastolic (congestive) heart failure: Secondary | ICD-10-CM | POA: Insufficient documentation

## 2018-05-25 DIAGNOSIS — G4089 Other seizures: Secondary | ICD-10-CM | POA: Diagnosis not present

## 2018-05-25 DIAGNOSIS — J40 Bronchitis, not specified as acute or chronic: Secondary | ICD-10-CM | POA: Insufficient documentation

## 2018-05-25 DIAGNOSIS — I639 Cerebral infarction, unspecified: Secondary | ICD-10-CM | POA: Diagnosis not present

## 2018-05-25 DIAGNOSIS — R0602 Shortness of breath: Secondary | ICD-10-CM | POA: Insufficient documentation

## 2018-05-25 LAB — POCT I-STAT EG7
Acid-Base Excess: 7 mmol/L — ABNORMAL HIGH (ref 0.0–2.0)
Bicarbonate: 31.8 mmol/L — ABNORMAL HIGH (ref 20.0–28.0)
Calcium, Ion: 1.05 mmol/L — ABNORMAL LOW (ref 1.15–1.40)
HCT: 37 % (ref 36.0–46.0)
Hemoglobin: 12.6 g/dL (ref 12.0–15.0)
O2 Saturation: 86 %
Potassium: 4 mmol/L (ref 3.5–5.1)
Sodium: 140 mmol/L (ref 135–145)
TCO2: 33 mmol/L — ABNORMAL HIGH (ref 22–32)
pCO2, Ven: 47.2 mmHg (ref 44.0–60.0)
pH, Ven: 7.437 — ABNORMAL HIGH (ref 7.250–7.430)
pO2, Ven: 50 mmHg — ABNORMAL HIGH (ref 32.0–45.0)

## 2018-05-25 LAB — CBC WITH DIFFERENTIAL/PLATELET
Abs Immature Granulocytes: 0.02 10*3/uL (ref 0.00–0.07)
Basophils Absolute: 0 10*3/uL (ref 0.0–0.1)
Basophils Relative: 1 %
Eosinophils Absolute: 0.2 10*3/uL (ref 0.0–0.5)
Eosinophils Relative: 4 %
HCT: 37.9 % (ref 36.0–46.0)
Hemoglobin: 12 g/dL (ref 12.0–15.0)
Immature Granulocytes: 0 %
Lymphocytes Relative: 24 %
Lymphs Abs: 1.3 10*3/uL (ref 0.7–4.0)
MCH: 29.3 pg (ref 26.0–34.0)
MCHC: 31.7 g/dL (ref 30.0–36.0)
MCV: 92.7 fL (ref 80.0–100.0)
Monocytes Absolute: 0.6 10*3/uL (ref 0.1–1.0)
Monocytes Relative: 11 %
Neutro Abs: 3.2 10*3/uL (ref 1.7–7.7)
Neutrophils Relative %: 60 %
Platelets: 180 10*3/uL (ref 150–400)
RBC: 4.09 MIL/uL (ref 3.87–5.11)
RDW: 15 % (ref 11.5–15.5)
WBC: 5.3 10*3/uL (ref 4.0–10.5)
nRBC: 0 % (ref 0.0–0.2)

## 2018-05-25 LAB — COMPREHENSIVE METABOLIC PANEL
ALT: 11 U/L (ref 0–44)
AST: 18 U/L (ref 15–41)
Albumin: 3.7 g/dL (ref 3.5–5.0)
Alkaline Phosphatase: 72 U/L (ref 38–126)
Anion gap: 11 (ref 5–15)
BUN: 11 mg/dL (ref 8–23)
CO2: 27 mmol/L (ref 22–32)
Calcium: 8.9 mg/dL (ref 8.9–10.3)
Chloride: 101 mmol/L (ref 98–111)
Creatinine, Ser: 0.91 mg/dL (ref 0.44–1.00)
GFR calc Af Amer: >60 mL/min (ref >60)
GFR calc non Af Amer: >60 mL/min (ref >60)
Glucose, Bld: 84 mg/dL (ref 70–99)
Potassium: 3.2 mmol/L — ABNORMAL LOW (ref 3.5–5.1)
Sodium: 139 mmol/L (ref 135–145)
Total Bilirubin: 0.8 mg/dL (ref 0.3–1.2)
Total Protein: 7.2 g/dL (ref 6.5–8.1)

## 2018-05-25 LAB — LACTIC ACID, PLASMA: Lactic Acid, Venous: 1 mmol/L (ref 0.5–1.9)

## 2018-05-25 LAB — BRAIN NATRIURETIC PEPTIDE: B Natriuretic Peptide: 181 pg/mL — ABNORMAL HIGH (ref 0.0–100.0)

## 2018-05-25 LAB — I-STAT TROPONIN, ED: Troponin i, poc: 0 ng/mL (ref 0.00–0.08)

## 2018-05-25 LAB — TROPONIN I: Troponin I: <0.03 ng/mL (ref <0.03)

## 2018-05-25 MED ORDER — ACETAMINOPHEN 325 MG PO TABS
650.0000 mg | ORAL_TABLET | Freq: Once | ORAL | Status: AC
  Administered 2018-05-25: 650 mg via ORAL
  Filled 2018-05-25: qty 2

## 2018-05-25 MED ORDER — SODIUM CHLORIDE 0.9 % IV BOLUS: 1000.0000 mL | Freq: Once | INTRAVENOUS | Status: DC

## 2018-05-25 MED ORDER — POTASSIUM CHLORIDE CRYS ER 20 MEQ PO TBCR
40.0000 meq | EXTENDED_RELEASE_TABLET | Freq: Once | ORAL | Status: AC
  Administered 2018-05-25: 40 meq via ORAL
  Filled 2018-05-25: qty 2

## 2018-05-25 MED ORDER — ALBUTEROL SULFATE HFA 108 (90 BASE) MCG/ACT IN AERS
1.0000 | INHALATION_SPRAY | Freq: Once | RESPIRATORY_TRACT | Status: AC
  Administered 2018-05-25: 1 via RESPIRATORY_TRACT
  Filled 2018-05-25: qty 6.7

## 2018-05-25 NOTE — ED Notes (Signed)
[facility] called for transport.  

## 2018-05-25 NOTE — ED Triage Notes (Signed)
Patient arrived via [facility]. Patient is AOx4 and ambulatory with 4-point walker. Patient called Doctor yesterday about having SOB. Patient has Home PRN home O2 and has used it since then with no changes in O2 status. Patient is currently on room air. Patient complains of SOB and malaise and chills.

## 2018-05-25 NOTE — Discharge Instructions (Signed)
You can take tylenol as needed for fever.   Use your oxygen as needed. Please check your oxygen levels at home.   Use albuterol every 4 hrs as needed for cough and shortness of breath.   See your doctor   You are low risk for coronavirus. Please see quarantine instructions below.   Return to ER if you have fever for 4 days, worsening shortness of breath and oxygen levels less than 90, trouble breathing.        Person Under Monitoring Name: Jillian Eaton  Location: [address] [place] [address]   Infection Prevention Recommendations for Individuals Confirmed to have, or Being Evaluated for, 2019 Novel Coronavirus (COVID-19) Infection Who Receive Care at Home  Individuals who are confirmed to have, or are being evaluated for, COVID-19 should follow the prevention steps below until a healthcare provider or local or state health department says they can return to normal activities.  Stay home except to get medical care You should restrict activities outside your home, except for getting medical care. Do not go to work, school, or public areas, and do not use public transportation or taxis.  Call ahead before visiting your doctor Before your medical appointment, call the healthcare provider and tell them that you have, or are being evaluated for, COVID-19 infection. This will help the healthcare providers office take steps to keep other people from getting infected. Ask your healthcare provider to call the local or state health department.  Monitor your symptoms Seek prompt medical attention if your illness is worsening (e.g., difficulty breathing). Before going to your medical appointment, call the healthcare provider and tell them that you have, or are being evaluated for, COVID-19 infection. Ask your healthcare provider to call the local or state health department.  Wear a facemask You should wear a facemask that covers your nose and mouth when you  are in the same room with other people and when you visit a healthcare provider. People who live with or visit you should also wear a facemask while they are in the same room with you.  Separate yourself from other people in your home As much as possible, you should stay in a different room from other people in your home. Also, you should use a separate bathroom, if available.  Avoid sharing household items You should not share dishes, drinking glasses, cups, eating utensils, towels, bedding, or other items with other people in your home. After using these items, you should wash them thoroughly with soap and water.  Cover your coughs and sneezes Cover your mouth and nose with a tissue when you cough or sneeze, or you can cough or sneeze into your sleeve. Throw used tissues in a lined trash can, and immediately wash your hands with soap and water for at least 20 seconds or use an alcohol-based hand rub.  Wash your [facility] your hands often and thoroughly with soap and water for at least 20 seconds. You can use an alcohol-based hand sanitizer if soap and water are not available and if your hands are not visibly dirty. Avoid touching your eyes, nose, and mouth with unwashed hands.   Prevention Steps for Caregivers and Household Members of Individuals Confirmed to have, or Being Evaluated for, COVID-19 Infection Being Cared for in the Home  If you live with, or provide care at home for, a person confirmed to have, or being evaluated for, COVID-19 infection please follow these guidelines to prevent infection:  Follow healthcare  providers instructions Make sure that you understand and can help the patient follow any healthcare provider instructions for all care.  Provide for the patients basic needs You should help the patient with basic needs in the home and provide support for getting groceries, prescriptions, and other personal needs.  Monitor the patients symptoms If they are  getting sicker, call his or her medical provider and tell them that the patient has, or is being evaluated for, COVID-19 infection. This will help the healthcare providers office take steps to keep other people from getting infected. Ask the healthcare provider to call the local or state health department.  Limit the number of people who have contact with the patient If possible, have only one caregiver for the patient. Other household members should stay in another home or place of residence. If this is not possible, they should stay in another room, or be separated from the patient as much as possible. Use a separate bathroom, if available. Restrict visitors who do not have an essential need to be in the home.  Keep older adults, very young children, and other sick people away from the patient Keep older adults, very young children, and those who have compromised immune systems or chronic health conditions away from the patient. This includes people with chronic heart, lung, or kidney conditions, diabetes, and cancer.  Ensure good ventilation Make sure that shared spaces in the home have good air flow, such as from an air conditioner or an opened window, weather permitting.  Wash your hands often Wash your hands often and thoroughly with soap and water for at least 20 seconds. You can use an alcohol based hand sanitizer if soap and water are not available and if your hands are not visibly dirty. Avoid touching your eyes, nose, and mouth with unwashed hands. Use disposable paper towels to dry your hands. If not available, use dedicated cloth towels and replace them when they become wet.  Wear a facemask and gloves Wear a disposable facemask at all times in the room and gloves when you touch or have contact with the patients blood, body fluids, and/or secretions or excretions, such as sweat, saliva, sputum, nasal mucus, vomit, urine, or feces.  Ensure the mask fits over your nose and mouth  tightly, and do not touch it during use. Throw out disposable facemasks and gloves after using them. Do not reuse. Wash your hands immediately after removing your facemask and gloves. If your personal clothing becomes contaminated, carefully remove clothing and launder. Wash your hands after handling contaminated clothing. Place all used disposable facemasks, gloves, and other waste in a lined container before disposing them with other household waste. Remove gloves and wash your hands immediately after handling these items.  Do not share dishes, glasses, or other household items with the patient Avoid sharing household items. You should not share dishes, drinking glasses, cups, eating utensils, towels, bedding, or other items with a patient who is confirmed to have, or being evaluated for, COVID-19 infection. After the person uses these items, you should wash them thoroughly with soap and water.  Wash laundry thoroughly Immediately remove and wash clothes or bedding that have blood, body fluids, and/or secretions or excretions, such as sweat, saliva, sputum, nasal mucus, vomit, urine, or feces, on them. Wear gloves when handling laundry from the patient. Read and follow directions on labels of laundry or clothing items and detergent. In general, wash and dry with the warmest temperatures recommended on the label.  Clean all  areas the individual has used often Clean all touchable surfaces, such as counters, tabletops, doorknobs, bathroom fixtures, toilets, phones, keyboards, tablets, and bedside tables, every day. Also, clean any surfaces that may have blood, body fluids, and/or secretions or excretions on them. Wear gloves when cleaning surfaces the patient has come in contact with. Use a diluted bleach solution (e.g., dilute bleach with 1 part bleach and 10 parts water) or a household disinfectant with a label that says EPA-registered for coronaviruses. To make a bleach solution at home, add 1  tablespoon of bleach to 1 quart (4 cups) of water. For a larger supply, add  cup of bleach to 1 gallon (16 cups) of water. Read labels of cleaning products and follow recommendations provided on product labels. Labels contain instructions for safe and effective use of the cleaning product including precautions you should take when applying the product, such as wearing gloves or eye protection and making sure you have good ventilation during use of the product. Remove gloves and wash hands immediately after cleaning.  Monitor yourself for signs and symptoms of illness Caregivers and household members are considered close contacts, should monitor their health, and will be asked to limit movement outside of the home to the extent possible. Follow the monitoring steps for close contacts listed on the symptom monitoring form.   ? If you have additional questions, contact your local health department or call the epidemiologist on call at [phone] (available 24/7). ? This guidance is subject to change. For the most up-to-date guidance from [facility], please refer to their website: [email or web address]

## 2018-05-25 NOTE — ED Provider Notes (Signed)
[facility] DEPT Provider Note   CSN: [id number] Arrival date & time: 05/25/18  1809    History   Chief Complaint Chief Complaint  Patient presents with  . Shortness of Breath    HPI Jillian Eaton is a 68 y.o. female hx of fibromyalgia, HTN, diastolic heart failure here presenting with shortness of breath.  Patient states that she has shortness of breath and myalgias since yesterday.  She had she had a temperature 101 yesterday.  She had some worsening shortness of breath this afternoon so took out her oxygen and put herself on 2 L nasal cannula.  Per EMS, her oxygen level was normal.  Patient denies any worsening leg swelling.  Patient actually saw her doctor by phone yesterday and thought to have flulike illness.  Patient states that she has not had any sick contacts.  She does go to the grocery store but just drives through [employer] and has her groceries but in the back.  She did not get encounter with anybody with known coronavirus.      The history is provided by the patient.    Past Medical History:  Diagnosis Date  . Anemia   . Asthma    flare up last yr  . CHF (congestive heart failure) ([place])   . Chronic headache   . DOE (dyspnea on exertion)    2D ECHO, 02/12/2012 - EF 60-65%, moderate concentric hypertrophy  . Fibromyalgia    nerve pain"left side at waist level" "can't lay on that side without pain" , "HOB elevation helps"  . Heart murmur   . Hematuria - cause not known   . History of kidney stones    x 2 '13, '14 surgery to remove  . Hypertension   . SBO (small bowel obstruction) ([place]) 06/07/2013  . Sleep apnea    tested several times and came up negative  . Thyroid disease    "goiter"  . Transfusion history    10 yrs+    Patient Active Problem List   Diagnosis Date Noted  . Rotator cuff tear arthropathy of right shoulder 04/05/2018  . Overactive bladder 04/05/2018  . Insomnia 04/05/2018  . Steroid-induced  hyperglycemia   . Acute blood loss as cause of postoperative anemia   . Generalized OA   . Super obese   . 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 NICM (nonischemic cardiomyopathy) ([place]) 01/15/2018  . Osteoporosis 12/13/2017  . Chronic diastolic CHF (congestive heart failure) ([place]) 09/28/2017  . 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 Hypercapnemia 07/13/2017  . AKI (acute kidney injury) ([place]) 07/10/2017  . On anticoagulant therapy 06/19/2017  . GERD (gastroesophageal reflux disease) 08/28/2016  . 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 Depression 08/28/2016  . Iatrogenic hypocalcemia 08/28/2016  . Chronic pain syndrome   . Fibromyalgia   . Asthma   . Acute diastolic (congestive) heart failure ([place])   . Atrial fibrillation ([place]) 04/28/2016  . Vocal cord dysfunction 04/28/2016  . Thrombocytopenia ([place]) 04/28/2016  . Seizures ([place])   . Preoperative cardiovascular examination   . Leg swelling 10/19/2015  . Acute on chronic respiratory failure with hypoxia and hypercapnia ([place]) 08/03/2015  . Bilateral leg edema 05/11/2015  . Essential hypertension 05/11/2015  . Pulmonary hypertension ([place]) 01/08/2013  . Anemia of chronic disease 03/28/2011  . Morbid obesity ([place]) 09/22/2010    Past Surgical History:  Procedure Laterality Date  . CARDIAC CATHETERIZATION  04/04/2010   No significant obstructive coronary artery disease  .  CARDIOVERSION N/A 08/08/2017   Procedure: CARDIOVERSION;  Surgeon: Pixie Casino, MD;  Location: [facility];  Service: Cardiovascular;  Laterality: N/A;  . CHOLECYSTECTOMY  1990  . COLONOSCOPY W/ POLYPECTOMY    . COLONOSCOPY WITH PROPOFOL N/A 04/10/2014   Procedure: COLONOSCOPY WITH PROPOFOL;  Surgeon: Beryle Beams, MD;  Location: [facility];  Service: Endoscopy;  Laterality: N/A;  . DIAGNOSTIC LAPAROSCOPY     x2 bowel obstructions(adhesions)  . EYE SURGERY     lasik 20-25 yrs ago  . GASTROPLASTY  1985   "weigh loss", a  surgery in '92"[place]" ([place])  . REVERSE SHOULDER ARTHROPLASTY Right 03/29/2018   Procedure: REVERSE SHOULDER ARTHROPLASTY;  Surgeon: Netta Cedars, MD;  Location: [facility];  Service: Orthopedics;  Laterality: Right;  . THYROIDECTOMY    . TUBAL LIGATION  1986     OB History   No obstetric history on file.      Home Medications    Prior to Admission medications   Medication Sig Start Date End Date Taking? Authorizing Provider  acetaminophen (TYLENOL) 650 MG CR tablet Take 1,300 mg by mouth 3 (three) times daily. At 6 am, 2 pm, and 10 pm   Yes [provider]  apixaban (ELIQUIS) 5 MG TABS tablet Take 1 tablet (5 mg total) by mouth 2 (two) times daily. 04/05/18  Yes Hennie Duos, MD  calcitRIOL (ROCALTROL) 0.5 MCG capsule TAKE 1 CAPSULE BY MOUTH 2 TIMES DAILY. Patient taking differently: Take 0.5 mcg by mouth 2 (two) times daily. TAKE 1 CAPSULE BY MOUTH 2 TIMES DAILY. 04/12/18  Yes Philemon Kingdom, MD  calcium carbonate (OS-CAL - DOSED IN MG OF ELEMENTAL CALCIUM) 1250 (500 Ca) MG tablet Take 2 tablets (1,000 mg of elemental calcium total) by mouth 2 (two) times daily. Patient taking differently: Take 2 tablets by mouth 2 (two) times daily with a meal.  04/05/18  Yes Hennie Duos, MD  carvedilol (COREG) 3.125 MG tablet Take 1 tablet (3.125 mg total) by mouth 2 (two) times daily with a meal. 04/05/18  Yes Hennie Duos, MD  cyanocobalamin (,VITAMIN B-12,) 1000 MCG/ML injection Inject 1 mL (1,000 mcg total) into the muscle every 30 (thirty) days. 04/05/18  Yes Hennie Duos, MD  cyclobenzaprine (FLEXERIL) 10 MG tablet Take 2 tablets (20 mg total) by mouth 2 (two) times daily. 04/05/18  Yes Hennie Duos, MD  diclofenac sodium (VOLTAREN) 1 % GEL Apply 2 g topically 2 (two) times daily as needed (for knee pain). 04/05/18  Yes Hennie Duos, MD  DULoxetine (CYMBALTA) 60 MG capsule Take 1 capsule (60 mg total) by mouth daily. 04/05/18  Yes Hennie Duos, MD   eszopiclone (LUNESTA) 2 MG TABS tablet Take 1 tablet (2 mg total) by mouth at bedtime. 04/05/18  Yes Hennie Duos, MD  FERREX 150 150 MG capsule Take 150 mg by mouth 2 (two) times daily. 05/01/18  Yes [provider]  furosemide (LASIX) 20 MG tablet Take 1 tablet (20 mg total) by mouth daily. 04/05/18  Yes Hennie Duos, MD  levothyroxine (SYNTHROID, LEVOTHROID) 200 MCG tablet Take 1 tablet (200 mcg total) by mouth daily before breakfast. 04/05/18  Yes Hennie Duos, MD  LYRICA 200 MG capsule Take 1 capsule (200 mg total) by mouth 2 (two) times daily. 04/05/18  Yes Hennie Duos, MD  magnesium oxide (MAG-OX) 400 MG tablet Take 1 tablet (400 mg total) by mouth daily. 04/05/18  Yes Hennie Duos,  MD  mirabegron ER (MYRBETRIQ) 50 MG TB24 tablet Take 1 tablet (50 mg total) by mouth daily. 04/05/18  Yes Hennie Duos, MD  senna (SENOKOT) 8.6 MG TABS tablet Take 1 tablet (8.6 mg total) by mouth daily. 04/05/18  Yes Hennie Duos, MD  traMADol (ULTRAM) 50 MG tablet Take 50 mg by mouth every 6 (six) hours as needed for moderate pain.  04/30/18  Yes [provider]  traZODone (DESYREL) 50 MG tablet Take 1 tablet (50 mg total) by mouth at bedtime. 04/05/18  Yes Hennie Duos, MD  Vitamin D, Ergocalciferol, (DRISDOL) 1.25 MG (50000 UT) CAPS capsule Take 1 capsule (50,000 Units total) by mouth every Wednesday. 04/10/18  Yes Hennie Duos, MD  clotrimazole (LOTRIMIN AF) 1 % cream Apply 1 application topically 2 (two) times daily. 05/21/18   Marzetta Board, DPM  methocarbamol (ROBAXIN) 500 MG tablet Take 1 tablet (500 mg total) by mouth 3 (three) times daily as needed. Patient not taking: Reported on 05/25/2018 04/05/18   Hennie Duos, MD  NONFORMULARY OR COMPOUNDED ITEM [facility]:  Antifungal Cream - Terbinafine 3%, Fluconazole 2%, Tea Tree Oil 5%, Urea 10%, Ibuprofen 2% in DMSO Suspension #56ml. Apply to affected toenail(s) once daily (at bedtime) or  twice daily. 05/07/18   Marzetta Board, DPM  oxycodone (OXY-IR) 5 MG capsule Take 1 capsule (5 mg total) by mouth 2 (two) times daily. At 10 am and 6 pm Patient not taking: Reported on 05/25/2018 04/05/18   Hennie Duos, MD    Family History Family History  Problem Relation Age of Onset  . Diabetes Mother   . Epilepsy Mother   . Cancer Mother        Breast  . Hypertension Mother   . Breast cancer Mother   . Kidney disease Father   . Diabetes Father   . Hypertension Father   . Asthma Father   . Heart disease Father   . Epilepsy Sister   . Cancer Maternal Grandmother   . Breast cancer Maternal Grandmother   . Cancer Paternal Grandmother   . Breast cancer Paternal Grandmother     Social History Social History   Tobacco Use  . Smoking status: Never Smoker  . Smokeless tobacco: Never Used  Substance Use Topics  . Alcohol use: No    Comment: wine occ  . Drug use: No    Types: Oxycodone    Comment: perscribed     Allergies   Norco [hydrocodone-acetaminophen]   Review of Systems Review of Systems  Constitutional: Positive for fever.  Respiratory: Positive for shortness of breath.   All other systems reviewed and are negative.    Physical Exam Updated Vital Signs BP (!) 143/96   Pulse 88   Temp 99.6 F (37.6 C) (Rectal)   Resp (!) 21   Ht 5\' 5"  (1.651 m)   Wt 132 kg   SpO2 99%   BMI 48.43 kg/m   Physical Exam Vitals signs reviewed.  Constitutional:      Appearance: She is well-developed.  HENT:     Head: Normocephalic.     Mouth/Throat:     Mouth: Mucous membranes are moist.  Eyes:     Extraocular Movements: Extraocular movements intact.     Pupils: Pupils are equal, round, and reactive to light.  Neck:     Musculoskeletal: Normal range of motion and neck supple.  Cardiovascular:     Rate and Rhythm: Normal rate and regular rhythm.  Pulmonary:     Comments: Slightly tachypneic, no wheezing or crackles  Abdominal:     General: Bowel  sounds are normal.     Palpations: Abdomen is soft.  Musculoskeletal: Normal range of motion.  Skin:    General: Skin is warm.     Capillary Refill: Capillary refill takes less than 2 seconds.  Neurological:     General: No focal deficit present.     Mental Status: She is alert and oriented to person, place, and time.  Psychiatric:        Mood and Affect: Mood normal.        Behavior: Behavior normal.      ED Treatments / Results  Labs (all labs ordered are listed, but only abnormal results are displayed) Labs Reviewed  COMPREHENSIVE METABOLIC PANEL - Abnormal; Notable for the following components:      Result Value   Potassium 3.2 (*)    All other components within normal limits  BRAIN NATRIURETIC PEPTIDE - Abnormal; Notable for the following components:   B Natriuretic Peptide 181.0 (*)    All other components within normal limits  POCT I-STAT EG7 - Abnormal; Notable for the following components:   pH, Ven 7.437 (*)    pO2, Ven 50.0 (*)    Bicarbonate 31.8 (*)    TCO2 33 (*)    Acid-Base Excess 7.0 (*)    Calcium, Ion 1.05 (*)    All other components within normal limits  CULTURE, BLOOD (ROUTINE X 2)  CULTURE, BLOOD (ROUTINE X 2)  CBC WITH DIFFERENTIAL/PLATELET  LACTIC ACID, PLASMA  TROPONIN I  URINALYSIS, ROUTINE W REFLEX MICROSCOPIC  I-STAT TROPONIN, ED    EKG EKG Interpretation  Date/Time:  Saturday May 25 2018 21:24:11 EDT Ventricular Rate:  77 PR Interval:    QRS Duration: 101 QT Interval:  413 QTC Calculation: 468 R Axis:   96 Text Interpretation:  Right and left arm electrode reversal, interpretation assumes no reversal Atrial fibrillation Right axis deviation Nonspecific T abnrm, anterolateral leads No significant change since last tracing Confirmed by Wandra Arthurs ([id number]) on 05/25/2018 9:33:53 PM   Radiology Dg Chest Port 1 View  Result Date: 05/25/2018 CLINICAL DATA:  68 year old female with shortness of breath. EXAM: PORTABLE CHEST 1 VIEW  COMPARISON:  Chest radiograph dated 09/18/2017 FINDINGS: The lungs are clear. There is no pleural effusion or pneumothorax. There is stable cardiomegaly. There is prominence of the bilateral pulmonary arteries suggestive of underlying pulmonary hypertension. Right shoulder arthroplasty. No acute osseous pathology. IMPRESSION: 1. No acute cardiopulmonary process. 2. Cardiomegaly with findings suggestive of underlying pulmonary hypertension. Electronically Signed   By: Anner Crete M.D.   On: 05/25/2018 19:14    Procedures Procedures (including critical care time)  Medications Ordered in ED Medications  acetaminophen (TYLENOL) tablet 650 mg (650 mg Oral Given 05/25/18 1944)  albuterol (PROVENTIL HFA;VENTOLIN HFA) 108 (90 Base) MCG/ACT inhaler 1 puff (1 puff Inhalation Given 05/25/18 1945)     Initial Impression / Assessment and Plan / ED Course  I have reviewed the triage vital signs and the nursing notes.  Pertinent labs & imaging results that were available during my care of the patient were reviewed by me and considered in my medical decision making (see chart for details).       Jillian Eaton is a 68 y.o. female here with SOB, fever. Febrile 101 yesterday. She has subjective SOB but no wheezing or crackles on my exam. She used oxygen at home  but her oxygen is 97% on RA. Consider pneumonia vs COVID-19 vs other viral syndrome. Will get labs, CXR. Given that she had no high risk travel or known COVID-19 contacts, I told her that, based on the current algorithm, she doesn't meet criteria for testing.   9:48 PM CXR clear. O2 remained 98-99% on RA. PH normal on VBG. Labs unremarkable. Likely mild bronchitis vs anxiety. She has a pulse ox at home and I encourage her to check it often. Gave strict return precautions.    Jillian Eaton was evaluated in Emergency Department on 05/25/2018 for the symptoms described in the history of present illness. She was evaluated in the  context of the global COVID-19 pandemic, which necessitated consideration that the patient might be at risk for infection with the SARS-CoV-2 virus that causes COVID-19. Institutional protocols and algorithms that pertain to the evaluation of patients at risk for COVID-19 are in a state of rapid change based on information released by regulatory bodies including the CDC and federal and state organizations. These policies and algorithms were followed during the patient's care in the ED.     Final Clinical Impressions(s) / ED Diagnoses   Final diagnoses:  None    ED Discharge Orders    None       Drenda Freeze, MD 05/25/18 2149

## 2018-05-25 NOTE — ED Notes (Signed)
ED Provider at bedside. 

## 2018-05-25 NOTE — ED Notes (Signed)
Assisted patient to the bedside commode. Patient had one episode of bowel movement, loose stool noted. Pur-wick applied, patient aware we need urine sample.

## 2018-05-25 NOTE — ED Notes (Signed)
Bed: QJ19 Expected date:  Expected time:  Means of arrival:  Comments: 68 yo SOB

## 2018-05-26 ENCOUNTER — Encounter (HOSPITAL_COMMUNITY): Payer: Self-pay | Admitting: Emergency Medicine

## 2018-05-26 ENCOUNTER — Inpatient Hospital Stay (HOSPITAL_COMMUNITY): Payer: Medicare Other

## 2018-05-26 ENCOUNTER — Other Ambulatory Visit: Payer: Self-pay

## 2018-05-26 ENCOUNTER — Emergency Department (HOSPITAL_COMMUNITY): Payer: Medicare Other

## 2018-05-26 ENCOUNTER — Inpatient Hospital Stay (HOSPITAL_COMMUNITY)
Admission: EM | Admit: 2018-05-26 | Discharge: 2018-05-30 | DRG: 100 | Disposition: A | Discharge status: Home Health | Payer: Medicare Other | Attending: Internal Medicine | Admitting: Internal Medicine

## 2018-05-26 DIAGNOSIS — R000 Tachycardia, unspecified: Secondary | ICD-10-CM | POA: Diagnosis not present

## 2018-05-26 DIAGNOSIS — M797 Fibromyalgia: Secondary | ICD-10-CM | POA: Diagnosis present

## 2018-05-26 DIAGNOSIS — Z6841 Body Mass Index (BMI) 40.0 and over, adult: Secondary | ICD-10-CM

## 2018-05-26 DIAGNOSIS — E874 Mixed disorder of acid-base balance: Secondary | ICD-10-CM | POA: Diagnosis present

## 2018-05-26 DIAGNOSIS — G4089 Other seizures: Secondary | ICD-10-CM | POA: Diagnosis present

## 2018-05-26 DIAGNOSIS — R4020 Unspecified coma: Secondary | ICD-10-CM | POA: Diagnosis not present

## 2018-05-26 DIAGNOSIS — D649 Anemia, unspecified: Secondary | ICD-10-CM | POA: Diagnosis present

## 2018-05-26 DIAGNOSIS — R402142 Coma scale, eyes open, spontaneous, at arrival to emergency department: Secondary | ICD-10-CM | POA: Diagnosis present

## 2018-05-26 DIAGNOSIS — G4730 Sleep apnea, unspecified: Secondary | ICD-10-CM | POA: Diagnosis present

## 2018-05-26 DIAGNOSIS — R1310 Dysphagia, unspecified: Secondary | ICD-10-CM | POA: Diagnosis not present

## 2018-05-26 DIAGNOSIS — I110 Hypertensive heart disease with heart failure: Secondary | ICD-10-CM | POA: Diagnosis not present

## 2018-05-26 DIAGNOSIS — R412 Retrograde amnesia: Secondary | ICD-10-CM | POA: Diagnosis present

## 2018-05-26 DIAGNOSIS — G9389 Other specified disorders of brain: Secondary | ICD-10-CM | POA: Diagnosis present

## 2018-05-26 DIAGNOSIS — G894 Chronic pain syndrome: Secondary | ICD-10-CM | POA: Diagnosis present

## 2018-05-26 DIAGNOSIS — G40909 Epilepsy, unspecified, not intractable, without status epilepticus: Secondary | ICD-10-CM

## 2018-05-26 DIAGNOSIS — Z96611 Presence of right artificial shoulder joint: Secondary | ICD-10-CM | POA: Diagnosis present

## 2018-05-26 DIAGNOSIS — Z79890 Hormone replacement therapy (postmenopausal): Secondary | ICD-10-CM

## 2018-05-26 DIAGNOSIS — Z820 Family history of epilepsy and other diseases of the nervous system: Secondary | ICD-10-CM

## 2018-05-26 DIAGNOSIS — Z79899 Other long term (current) drug therapy: Secondary | ICD-10-CM | POA: Diagnosis not present

## 2018-05-26 DIAGNOSIS — R29725 NIHSS score 25: Secondary | ICD-10-CM | POA: Diagnosis present

## 2018-05-26 DIAGNOSIS — F329 Major depressive disorder, single episode, unspecified: Secondary | ICD-10-CM | POA: Diagnosis present

## 2018-05-26 DIAGNOSIS — E032 Hypothyroidism due to medicaments and other exogenous substances: Secondary | ICD-10-CM | POA: Diagnosis not present

## 2018-05-26 DIAGNOSIS — Z885 Allergy status to narcotic agent status: Secondary | ICD-10-CM | POA: Diagnosis not present

## 2018-05-26 DIAGNOSIS — R402242 Coma scale, best verbal response, confused conversation, at arrival to emergency department: Secondary | ICD-10-CM | POA: Diagnosis present

## 2018-05-26 DIAGNOSIS — I5042 Chronic combined systolic (congestive) and diastolic (congestive) heart failure: Secondary | ICD-10-CM | POA: Diagnosis present

## 2018-05-26 DIAGNOSIS — I482 Chronic atrial fibrillation, unspecified: Secondary | ICD-10-CM | POA: Diagnosis present

## 2018-05-26 DIAGNOSIS — I480 Paroxysmal atrial fibrillation: Secondary | ICD-10-CM | POA: Diagnosis not present

## 2018-05-26 DIAGNOSIS — I4891 Unspecified atrial fibrillation: Secondary | ICD-10-CM | POA: Diagnosis present

## 2018-05-26 DIAGNOSIS — I5032 Chronic diastolic (congestive) heart failure: Secondary | ICD-10-CM

## 2018-05-26 DIAGNOSIS — I639 Cerebral infarction, unspecified: Secondary | ICD-10-CM

## 2018-05-26 DIAGNOSIS — Z888 Allergy status to other drugs, medicaments and biological substances status: Secondary | ICD-10-CM

## 2018-05-26 DIAGNOSIS — R402342 Coma scale, best motor response, flexion withdrawal, at arrival to emergency department: Secondary | ICD-10-CM | POA: Diagnosis present

## 2018-05-26 DIAGNOSIS — I10 Essential (primary) hypertension: Secondary | ICD-10-CM | POA: Diagnosis present

## 2018-05-26 DIAGNOSIS — R0902 Hypoxemia: Secondary | ICD-10-CM | POA: Diagnosis not present

## 2018-05-26 DIAGNOSIS — R404 Transient alteration of awareness: Secondary | ICD-10-CM | POA: Diagnosis not present

## 2018-05-26 DIAGNOSIS — K219 Gastro-esophageal reflux disease without esophagitis: Secondary | ICD-10-CM | POA: Diagnosis present

## 2018-05-26 DIAGNOSIS — I6389 Other cerebral infarction: Secondary | ICD-10-CM | POA: Diagnosis present

## 2018-05-26 DIAGNOSIS — G9340 Encephalopathy, unspecified: Secondary | ICD-10-CM | POA: Diagnosis not present

## 2018-05-26 DIAGNOSIS — R569 Unspecified convulsions: Secondary | ICD-10-CM

## 2018-05-26 DIAGNOSIS — Z8669 Personal history of other diseases of the nervous system and sense organs: Secondary | ICD-10-CM | POA: Diagnosis not present

## 2018-05-26 DIAGNOSIS — Z825 Family history of asthma and other chronic lower respiratory diseases: Secondary | ICD-10-CM

## 2018-05-26 DIAGNOSIS — Z7901 Long term (current) use of anticoagulants: Secondary | ICD-10-CM

## 2018-05-26 DIAGNOSIS — Z79891 Long term (current) use of opiate analgesic: Secondary | ICD-10-CM

## 2018-05-26 DIAGNOSIS — E669 Obesity, unspecified: Secondary | ICD-10-CM | POA: Diagnosis present

## 2018-05-26 DIAGNOSIS — J45909 Unspecified asthma, uncomplicated: Secondary | ICD-10-CM | POA: Diagnosis present

## 2018-05-26 DIAGNOSIS — Z8249 Family history of ischemic heart disease and other diseases of the circulatory system: Secondary | ICD-10-CM

## 2018-05-26 DIAGNOSIS — Z87442 Personal history of urinary calculi: Secondary | ICD-10-CM

## 2018-05-26 DIAGNOSIS — I509 Heart failure, unspecified: Secondary | ICD-10-CM | POA: Diagnosis not present

## 2018-05-26 DIAGNOSIS — I517 Cardiomegaly: Secondary | ICD-10-CM | POA: Diagnosis not present

## 2018-05-26 DIAGNOSIS — I63542 Cerebral infarction due to unspecified occlusion or stenosis of left cerebellar artery: Secondary | ICD-10-CM | POA: Diagnosis not present

## 2018-05-26 LAB — COMPREHENSIVE METABOLIC PANEL
ALT: 13 U/L (ref 0–44)
AST: 31 U/L (ref 15–41)
Albumin: 3.6 g/dL (ref 3.5–5.0)
Alkaline Phosphatase: 77 U/L (ref 38–126)
Anion gap: 15 (ref 5–15)
BUN: 12 mg/dL (ref 8–23)
CO2: 22 mmol/L (ref 22–32)
Calcium: 8.7 mg/dL — ABNORMAL LOW (ref 8.9–10.3)
Chloride: 102 mmol/L (ref 98–111)
Creatinine, Ser: 1.21 mg/dL — ABNORMAL HIGH (ref 0.44–1.00)
GFR calc Af Amer: 54 mL/min — ABNORMAL LOW (ref >60)
GFR calc non Af Amer: 47 mL/min — ABNORMAL LOW (ref >60)
Glucose, Bld: 141 mg/dL — ABNORMAL HIGH (ref 70–99)
Potassium: 3.5 mmol/L (ref 3.5–5.1)
Sodium: 139 mmol/L (ref 135–145)
Total Bilirubin: 0.5 mg/dL (ref 0.3–1.2)
Total Protein: 7.2 g/dL (ref 6.5–8.1)

## 2018-05-26 LAB — CBC WITH DIFFERENTIAL/PLATELET
Abs Immature Granulocytes: 0.02 10*3/uL (ref 0.00–0.07)
Basophils Absolute: 0 10*3/uL (ref 0.0–0.1)
Basophils Relative: 0 %
Eosinophils Absolute: 0 10*3/uL (ref 0.0–0.5)
Eosinophils Relative: 0 %
HCT: 41.7 % (ref 36.0–46.0)
Hemoglobin: 12.4 g/dL (ref 12.0–15.0)
Immature Granulocytes: 0 %
Lymphocytes Relative: 10 %
Lymphs Abs: 0.8 10*3/uL (ref 0.7–4.0)
MCH: 28.1 pg (ref 26.0–34.0)
MCHC: 29.7 g/dL — ABNORMAL LOW (ref 30.0–36.0)
MCV: 94.3 fL (ref 80.0–100.0)
Monocytes Absolute: 0.2 10*3/uL (ref 0.1–1.0)
Monocytes Relative: 3 %
Neutro Abs: 6.4 10*3/uL (ref 1.7–7.7)
Neutrophils Relative %: 87 %
Platelets: 175 10*3/uL (ref 150–400)
RBC: 4.42 MIL/uL (ref 3.87–5.11)
RDW: 15.1 % (ref 11.5–15.5)
WBC: 7.4 10*3/uL (ref 4.0–10.5)
nRBC: 0 % (ref 0.0–0.2)

## 2018-05-26 LAB — POCT I-STAT EG7
Acid-Base Excess: 5 mmol/L — ABNORMAL HIGH (ref 0.0–2.0)
Bicarbonate: 27.8 mmol/L (ref 20.0–28.0)
Calcium, Ion: 0.99 mmol/L — ABNORMAL LOW (ref 1.15–1.40)
HCT: 39 % (ref 36.0–46.0)
Hemoglobin: 13.3 g/dL (ref 12.0–15.0)
O2 Saturation: 97 %
Potassium: 4.6 mmol/L (ref 3.5–5.1)
Sodium: 139 mmol/L (ref 135–145)
TCO2: 29 mmol/L (ref 22–32)
pCO2, Ven: 32.3 mmHg — ABNORMAL LOW (ref 44.0–60.0)
pH, Ven: 7.542 — ABNORMAL HIGH (ref 7.250–7.430)
pO2, Ven: 81 mmHg — ABNORMAL HIGH (ref 32.0–45.0)

## 2018-05-26 LAB — LACTIC ACID, PLASMA: Lactic Acid, Venous: 9 mmol/L (ref 0.5–1.9)

## 2018-05-26 LAB — BRAIN NATRIURETIC PEPTIDE: B Natriuretic Peptide: 336.6 pg/mL — ABNORMAL HIGH (ref 0.0–100.0)

## 2018-05-26 LAB — TROPONIN I: Troponin I: <0.03 ng/mL (ref <0.03)

## 2018-05-26 MED ORDER — LORAZEPAM 2 MG/ML IJ SOLN
INTRAMUSCULAR | Status: AC
  Administered 2018-05-26: 1 mg via INTRAVENOUS
  Filled 2018-05-26: qty 1

## 2018-05-26 MED ORDER — LEVETIRACETAM IN NACL 500 MG/100ML IV SOLN
500.0000 mg | Freq: Two times a day (BID) | INTRAVENOUS | Status: DC
  Administered 2018-05-27 – 2018-05-29 (×5): 500 mg via INTRAVENOUS
  Filled 2018-05-26 (×5): qty 100

## 2018-05-26 MED ORDER — LORAZEPAM 2 MG/ML IJ SOLN
1.0000 mg | Freq: Once | INTRAMUSCULAR | Status: AC
  Administered 2018-05-26: 15:00:00 1 mg via INTRAVENOUS

## 2018-05-26 MED ORDER — ORAL CARE MOUTH RINSE
15.0000 mL | Freq: Two times a day (BID) | OROMUCOSAL | Status: DC
  Administered 2018-05-27 – 2018-05-28 (×4): 15 mL via OROMUCOSAL

## 2018-05-26 MED ORDER — SENNOSIDES-DOCUSATE SODIUM 8.6-50 MG PO TABS: 1.0000 | ORAL_TABLET | Freq: Every evening | ORAL | Status: DC | PRN

## 2018-05-26 MED ORDER — ACETAMINOPHEN 325 MG PO TABS
650.0000 mg | ORAL_TABLET | Freq: Four times a day (QID) | ORAL | Status: DC | PRN
  Administered 2018-05-29 (×2): 650 mg via ORAL
  Filled 2018-05-26 (×2): qty 2

## 2018-05-26 MED ORDER — CHLORHEXIDINE GLUCONATE 0.12 % MT SOLN
15.0000 mL | Freq: Two times a day (BID) | OROMUCOSAL | Status: DC
  Administered 2018-05-27 – 2018-05-29 (×6): 15 mL via OROMUCOSAL
  Filled 2018-05-26 (×8): qty 15

## 2018-05-26 MED ORDER — PROMETHAZINE HCL 25 MG PO TABS
12.5000 mg | ORAL_TABLET | Freq: Four times a day (QID) | ORAL | Status: DC | PRN
  Filled 2018-05-26: qty 1

## 2018-05-26 MED ORDER — APIXABAN 5 MG PO TABS
5.0000 mg | ORAL_TABLET | Freq: Two times a day (BID) | ORAL | Status: DC
  Administered 2018-05-27 – 2018-05-30 (×7): 5 mg via ORAL
  Filled 2018-05-26 (×7): qty 1

## 2018-05-26 MED ORDER — LEVETIRACETAM IN NACL 1000 MG/100ML IV SOLN
1000.0000 mg | Freq: Once | INTRAVENOUS | Status: AC
  Administered 2018-05-26: 1000 mg via INTRAVENOUS
  Filled 2018-05-26: qty 100

## 2018-05-26 MED ORDER — ACETAMINOPHEN 650 MG RE SUPP: 650.0000 mg | Freq: Four times a day (QID) | RECTAL | Status: DC | PRN

## 2018-05-26 NOTE — Progress Notes (Addendum)
Doree Fudge (niece) called for update. Niece not given any text results. Was informed on what type of unit this is after being asked if pt was in ICU.

## 2018-05-26 NOTE — ED Notes (Signed)
Family contact: Salomon Fick (daughter)  [phone]

## 2018-05-26 NOTE — ED Notes (Addendum)
ED TO INPATIENT HANDOFF REPORT  ED Nurse Name and Phone #:   S Name/Age/Gender Hebert Soho Sokoloski 68 y.o. female Room/Bed: 031C/031C  Code Status   Code Status: Full Code  Home/SNF/Other Home Patient oriented to: self Is this baseline? No   Triage Complete: Triage complete  Chief Complaint Non-responsive  Triage Note Per EMS:  Pt here for seizure like activity.  EMS reported upon their arrival pt was unresponsive with a right sided gaze.  Pt began to have what EMS reports as a 2-3 minute full body seizure then became apneic, EMS began assisting with ventilations.  No recent falls reported by family.  LSN 05/25/18 1900   Allergies Allergies  Allergen Reactions  . Norco [Hydrocodone-Acetaminophen] Itching    Level of Care/Admitting Diagnosis ED Disposition    ED Disposition Condition Comment   Admit  Hospital Area: [place] [[id number]]  Level of Care: Progressive [102]  Diagnosis: Acute CVA (cerebrovascular accident) [facility]) [[id number]]  Admitting Physician: Bosie Helper  Attending Physician: Lucious Groves [[id number]]  Estimated length of stay: past midnight tomorrow  Certification:: I certify this patient will need inpatient services for at least 2 midnights  PT Class (Do Not Modify): Inpatient [101]  PT Acc Code (Do Not Modify): Private [1]       B Medical/Surgery History Past Medical History:  Diagnosis Date  . Anemia   . Asthma    flare up last yr  . CHF (congestive heart failure) ([place])   . Chronic headache   . DOE (dyspnea on exertion)    2D ECHO, 02/12/2012 - EF 60-65%, moderate concentric hypertrophy  . Fibromyalgia    nerve pain"left side at waist level" "can't lay on that side without pain" , "HOB elevation helps"  . Heart murmur   . Hematuria - cause not known   . History of kidney stones    x 2 '13, '14 surgery to remove  . Hypertension   . SBO (small bowel obstruction) ([place]) 06/07/2013  . Sleep apnea    tested  several times and came up negative  . Thyroid disease    "goiter"  . Transfusion history    10 yrs+   Past Surgical History:  Procedure Laterality Date  . CARDIAC CATHETERIZATION  04/04/2010   No significant obstructive coronary artery disease  . CARDIOVERSION N/A 08/08/2017   Procedure: CARDIOVERSION;  Surgeon: Pixie Casino, MD;  Location: [facility];  Service: Cardiovascular;  Laterality: N/A;  . CHOLECYSTECTOMY  1990  . COLONOSCOPY W/ POLYPECTOMY    . COLONOSCOPY WITH PROPOFOL N/A 04/10/2014   Procedure: COLONOSCOPY WITH PROPOFOL;  Surgeon: Beryle Beams, MD;  Location: [facility];  Service: Endoscopy;  Laterality: N/A;  . DIAGNOSTIC LAPAROSCOPY     x2 bowel obstructions(adhesions)  . EYE SURGERY     lasik 20-25 yrs ago  . 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 REVERSE SHOULDER ARTHROPLASTY Right 03/29/2018   Procedure: REVERSE SHOULDER ARTHROPLASTY;  Surgeon: Netta Cedars, MD;  Location: [place];  Service: Orthopedics;  Laterality: Right;  . THYROIDECTOMY    . TUBAL LIGATION  1986     A IV Location/Drains/Wounds Patient Lines/Drains/Airways Status   Active Line/Drains/Airways    Name:   Placement date:   Placement time:   Site:   Days:   Peripheral IV 05/26/18 Left Wrist   05/26/18    1522    Wrist   less than 1  Peripheral IV 05/26/18 Left Antecubital   05/26/18    1649    Antecubital   less than 1   CentriMag LVAD   07/14/17    0934    -   316          Intake/Output Last 24 hours No intake or output data in the 24 hours ending 05/26/18 1812  Labs/Imaging Results for orders placed or performed during the hospital encounter of 05/26/18 (from the past 48 hour(s))  CBC with Differential/Platelet     Status: Abnormal   Collection Time: 05/26/18  3:14 PM  Result Value Ref Range   WBC 7.4 4.0 - 10.5 K/uL   RBC 4.42 3.87 - 5.11 MIL/uL   Hemoglobin 12.4 12.0 - 15.0 g/dL   HCT 41.7 36.0 - 46.0 %   MCV 94.3 80.0 - 100.0 fL    MCH 28.1 26.0 - 34.0 pg   MCHC 29.7 (L) 30.0 - 36.0 g/dL   RDW 15.1 11.5 - 15.5 %   Platelets 175 150 - 400 K/uL   nRBC 0.0 0.0 - 0.2 %   Neutrophils Relative % 87 %   Neutro Abs 6.4 1.7 - 7.7 K/uL   Lymphocytes Relative 10 %   Lymphs Abs 0.8 0.7 - 4.0 K/uL   Monocytes Relative 3 %   Monocytes Absolute 0.2 0.1 - 1.0 K/uL   Eosinophils Relative 0 %   Eosinophils Absolute 0.0 0.0 - 0.5 K/uL   Basophils Relative 0 %   Basophils Absolute 0.0 0.0 - 0.1 K/uL   Immature Granulocytes 0 %   Abs Immature Granulocytes 0.02 0.00 - 0.07 K/uL    Comment: Performed at [facility], [address], [place], [place] [address]  Comprehensive metabolic panel     Status: Abnormal   Collection Time: 05/26/18  3:14 PM  Result Value Ref Range   Sodium 139 135 - 145 mmol/L   Potassium 3.5 3.5 - 5.1 mmol/L   Chloride 102 98 - 111 mmol/L   CO2 22 22 - 32 mmol/L   Glucose, Bld 141 (H) 70 - 99 mg/dL   BUN 12 8 - 23 mg/dL   Creatinine, Ser 1.21 (H) 0.44 - 1.00 mg/dL   Calcium 8.7 (L) 8.9 - 10.3 mg/dL   Total Protein 7.2 6.5 - 8.1 g/dL   Albumin 3.6 3.5 - 5.0 g/dL   AST 31 15 - 41 U/L   ALT 13 0 - 44 U/L   Alkaline Phosphatase 77 38 - 126 U/L   Total Bilirubin 0.5 0.3 - 1.2 mg/dL   GFR calc non Af Amer 47 (L) >60 mL/min   GFR calc Af Amer 54 (L) >60 mL/min   Anion gap 15 5 - 15    Comment: Performed at [facility] [address], [place], [place] [address]  Brain natriuretic peptide     Status: Abnormal   Collection Time: 05/26/18  3:14 PM  Result Value Ref Range   B Natriuretic Peptide 336.6 (H) 0.0 - 100.0 pg/mL    Comment: Performed at [facility] [address] [place], [place] [address]  Lactic acid, plasma     Status: Abnormal   Collection Time: 05/26/18  3:14 PM  Result Value Ref Range   Lactic Acid, Venous 9.0 (HH) 0.5 - 1.9 mmol/L    Comment: CRITICAL RESULT CALLED TO, READ BACK BY AND VERIFIED WITH: Sheilah Pigeon RN @ [phone] ON 05/26/2018 BY TEMOCHE, H Performed at  [facility]  [facility] Lab, [place] [address], [place], [place] [address]   Troponin I - Add-On to previous collection     Status: None   Collection Time: 05/26/18  3:14 PM  Result Value Ref Range   Troponin I <0.03 <0.03 ng/mL    Comment: Performed at [facility] [address] [place], [place] [address]  POCT I-Stat EG7     Status: Abnormal   Collection Time: 05/26/18  5:10 PM  Result Value Ref Range   pH, Ven 7.542 (H) 7.250 - 7.430   pCO2, Ven 32.3 (L) 44.0 - 60.0 mmHg   pO2, Ven 81.0 (H) 32.0 - 45.0 mmHg   Bicarbonate 27.8 20.0 - 28.0 mmol/L   TCO2 29 22 - 32 mmol/L   O2 Saturation 97.0 %   Acid-Base Excess 5.0 (H) 0.0 - 2.0 mmol/L   Sodium 139 135 - 145 mmol/L   Potassium 4.6 3.5 - 5.1 mmol/L   Calcium, Ion 0.99 (L) 1.15 - 1.40 mmol/L   HCT 39.0 36.0 - 46.0 %   Hemoglobin 13.3 12.0 - 15.0 g/dL   Patient temperature HIDE    Sample type VENOUS    Ct Head Wo Contrast  Result Date: 05/26/2018 CLINICAL DATA:  Altered level of consciousness EXAM: CT HEAD WITHOUT CONTRAST TECHNIQUE: Contiguous axial images were obtained from the base of the skull through the vertex without intravenous contrast. COMPARISON:  None. FINDINGS: Brain: Low attenuation in the left cerebellum most concerning for an acute infarct. No evidence of hemorrhage, hydrocephalus, extra-axial collection or mass lesion/mass effect. Vascular: No hyperdense vessel or unexpected calcification. Skull: No osseous abnormality. Sinuses/Orbits: Visualized paranasal sinuses are clear. Visualized mastoid sinuses are clear. Visualized orbits demonstrate no focal abnormality. Other: None IMPRESSION: 1. Acute nonhemorrhagic left cerebellar infarct. Electronically Signed   By: Kathreen Devoid   On: 05/26/2018 16:21   Dg Chest Port 1 View  Result Date: 05/26/2018 CLINICAL DATA:  Seizure-like activity.  Difficulty breathing. EXAM: PORTABLE CHEST 1 VIEW COMPARISON:  05/25/2018 FINDINGS: Patient is slightly rotated to the right. Lungs are  adequately inflated without focal consolidation or effusion. Subtle hazy prominence of the central pulmonary vasculature. Moderate stable cardiomegaly. Remainder of the exam is unchanged. IMPRESSION: Moderate stable cardiomegaly with suggestion of minimal vascular congestion. Electronically Signed   By: Marin Olp M.D.   On: 05/26/2018 15:30   Dg Chest Port 1 View  Result Date: 05/25/2018 CLINICAL DATA:  68 year old female with shortness of breath. EXAM: PORTABLE CHEST 1 VIEW COMPARISON:  Chest radiograph dated 09/18/2017 FINDINGS: The lungs are clear. There is no pleural effusion or pneumothorax. There is stable cardiomegaly. There is prominence of the bilateral pulmonary arteries suggestive of underlying pulmonary hypertension. Right shoulder arthroplasty. No acute osseous pathology. IMPRESSION: 1. No acute cardiopulmonary process. 2. Cardiomegaly with findings suggestive of underlying pulmonary hypertension. Electronically Signed   By: Anner Crete M.D.   On: 05/25/2018 19:14    Pending Labs Unresulted Labs (From admission, onward)    Start     Ordered   05/27/18 7209  Basic metabolic panel  Tomorrow morning,   R     05/26/18 1751   05/27/18 0500  CBC  Tomorrow morning,   R     05/26/18 1751   05/26/18 1451  Urine Culture  Once,   STAT     05/26/18 1451   05/26/18 1451  Urinalysis, Routine w reflex microscopic  Once,   R     05/26/18 1451   05/26/18 1451  Culture, blood (Routine X 2) w Reflex to ID Panel  BLOOD CULTURE X 2,   STAT     05/26/18 1451          Vitals/Pain Today's Vitals   05/26/18 1645 05/26/18 1715 05/26/18 1730 05/26/18 1745  BP: 121/79 111/77 116/80 129/87  Pulse: (!) 105 (!) 104 (!) 107 (!) 104  Resp: (!) 21 20 (!) 22 (!) 21  Temp:      TempSrc:      SpO2: 100% 100% 100% 100%  Weight:      Height:        Isolation Precautions No active isolations  Medications Medications  levETIRAcetam (KEPPRA) IVPB 500 mg/100 mL premix (has no administration in  time range)  acetaminophen (TYLENOL) tablet 650 mg (has no administration in time range)    Or  acetaminophen (TYLENOL) suppository 650 mg (has no administration in time range)  senna-docusate (Senokot-S) tablet 1 tablet (has no administration in time range)  promethazine (PHENERGAN) tablet 12.5 mg (has no administration in time range)  LORazepam (ATIVAN) injection 1 mg (1 mg Intravenous Given 05/26/18 1511)  levETIRAcetam (KEPPRA) IVPB 1000 mg/100 mL premix (1,000 mg Intravenous New Bag/Given 05/26/18 1715)    Mobility walks with device Moderate fall risk   Focused Assessments Neuro Assessment Handoff:  Swallow screen pass? No          Neuro Assessment: Exceptions to WDL Neuro Checks:      Last Documented NIHSS Modified Score:   Has TPA been given? NO If patient is a Neuro Trauma and patient is going to OR before floor call report to [place] nurse: [phone] or [phone]     R Recommendations: See Admitting Provider Note  Report given to:   Additional Notes:  68 year old female presenting today with altered mental status and witnessed seizure by paramedics.  Patient had a second seizure here and has remained postictal after receiving 1 mg of Ativan IV.  Patient is currently in A. fib RVR with history of atrial fibrillation on Eliquis.  Patient seen yesterday for shortness of breath and fever reported within the last 24 hours. Pt failed 1st attempt at swallow screen

## 2018-05-26 NOTE — H&P (Signed)
Date: 05/26/2018               Patient Name:  Jillian Eaton [facility] MRN: [id number]  DOB: [birth year] Age / Sex: 68 y.o., female   PCP: [name], MD         Medical Service: Internal Medicine Teaching Service         Attending Physician: Dr. Blanchie Dessert, MD    First Contact: Dr. Alfonse Spruce Pager: [phone]  Second Contact: Dr. Maricela Bo Pager: [phone]       After Hours (After 5p/  First Contact Pager: [phone]  weekends / holidays): Second Contact Pager: [phone]   Chief Complaint: Altered mental status  History of Present Illness: Jillian Eaton is a 68 year old female with atrial fibrillation on Eliquis, chronic diastolic heart failure, hypertension, morbid obesity, history of seizure in 2017 but not on medications, and iatrogenic hypothyroidism who presents with altered mental status.  Jillian Eaton is unable to provide the history due to her altered mental status.  I cannot reach her husband Kaeley Vinje.  History is taken primarily from EMS and ED notes.  She was seen in the [facility] ED yesterday with shortness of breath and reported fever at home of 101F (no fevers in the ED).  Work-up was unremarkable including clear chest x-ray and normal labs.  She had normal vital signs including oxygen saturations on room air and symptoms were thought to be mild bronchitis versus anxiety.  She was discharged home.  Per the ED note, husband reported that last night she was at her baseline (talking and interacting appropriately) but when she woke up this morning she was not her normal self.  He stated that she was not acting right was staring off in space.  EMS was subsequently called.  When EMS arrived she was staring at the right and proceeded to have a tonic-clonic seizure that lasted approximately 2 minutes.  She appeared to have generalized shaking, biting of her tongue, and defecated on herself.  She then became apneic and was placed on a bag valve mask.  She was later switched to  nonrebreather.  In the ED she had another witnessed seizure which lasted approximately 1 minute and characterized as more focal on the right side with jerking on the right upper extremity and lower extremity with some tongue biting.  Vital signs have remained within normal limits.  Labs were significant for respiratory alkalosis with VBG: pH 7.542.  PCO2 32, PO2 of 8, bicarb 27.8.  CBC unremarkable.  Elevated creatinine of 1.21 (baseline between 0.9 and 0.99).  BNP elevated at 336.  Lactic acidosis at 9.0.  Head CT showed an acute nonhemorrhagic left cerebellar infarct.  Meds:  No current facility-administered medications on file prior to encounter.    Current Outpatient Medications on File Prior to Encounter  Medication Sig   acetaminophen (TYLENOL) 650 MG CR tablet Take 1,300 mg by mouth 3 (three) times daily. At 6 am, 2 pm, and 10 pm   apixaban (ELIQUIS) 5 MG TABS tablet Take 1 tablet (5 mg total) by mouth 2 (two) times daily.   calcitRIOL (ROCALTROL) 0.5 MCG capsule TAKE 1 CAPSULE BY MOUTH 2 TIMES DAILY. (Patient taking differently: Take 0.5 mcg by mouth 2 (two) times daily. )   calcium carbonate (OS-CAL - DOSED IN MG OF ELEMENTAL CALCIUM) 1250 (500 Ca) MG tablet Take 2 tablets (1,000 mg of elemental calcium total) by mouth 2 (two) times daily. (Patient taking differently: Take 2 tablets by mouth  2 (two) times daily with a meal. )   carvedilol (COREG) 3.125 MG tablet Take 1 tablet (3.125 mg total) by mouth 2 (two) times daily with a meal.   clotrimazole (LOTRIMIN AF) 1 % cream Apply 1 application topically 2 (two) times daily.   cyanocobalamin (,VITAMIN B-12,) 1000 MCG/ML injection Inject 1 mL (1,000 mcg total) into the muscle every 30 (thirty) days.   cyclobenzaprine (FLEXERIL) 10 MG tablet Take 2 tablets (20 mg total) by mouth 2 (two) times daily.   diclofenac sodium (VOLTAREN) 1 % GEL Apply 2 g topically 2 (two) times daily as needed (for knee pain).   DULoxetine (CYMBALTA) 60 MG  capsule Take 1 capsule (60 mg total) by mouth daily.   eszopiclone (LUNESTA) 2 MG TABS tablet Take 1 tablet (2 mg total) by mouth at bedtime.   FERREX 150 150 MG capsule Take 150 mg by mouth 2 (two) times daily.   furosemide (LASIX) 20 MG tablet Take 1 tablet (20 mg total) by mouth daily.   levothyroxine (SYNTHROID, LEVOTHROID) 200 MCG tablet Take 1 tablet (200 mcg total) by mouth daily before breakfast.   LYRICA 200 MG capsule Take 1 capsule (200 mg total) by mouth 2 (two) times daily.   magnesium oxide (MAG-OX) 400 MG tablet Take 1 tablet (400 mg total) by mouth daily.   methocarbamol (ROBAXIN) 500 MG tablet Take 1 tablet (500 mg total) by mouth 3 (three) times daily as needed.   mirabegron ER (MYRBETRIQ) 50 MG TB24 tablet Take 1 tablet (50 mg total) by mouth daily.   NONFORMULARY OR COMPOUNDED ITEM [facility]:  Antifungal Cream - Terbinafine 3%, Fluconazole 2%, Tea Tree Oil 5%, Urea 10%, Ibuprofen 2% in DMSO Suspension #72ml. Apply to affected toenail(s) once daily (at bedtime) or twice daily.   oxycodone (OXY-IR) 5 MG capsule Take 1 capsule (5 mg total) by mouth 2 (two) times daily. At 10 am and 6 pm   senna (SENOKOT) 8.6 MG TABS tablet Take 1 tablet (8.6 mg total) by mouth daily.   traMADol (ULTRAM) 50 MG tablet Take 50 mg by mouth every 6 (six) hours as needed for moderate pain.    traZODone (DESYREL) 50 MG tablet Take 1 tablet (50 mg total) by mouth at bedtime.   Vitamin D, Ergocalciferol, (DRISDOL) 1.25 MG (50000 UT) CAPS capsule Take 1 capsule (50,000 Units total) by mouth every Wednesday.    Allergies: Allergies as of 05/26/2018 - Review Complete 05/26/2018  Allergen Reaction Noted   Norco [hydrocodone-acetaminophen] Itching 06/19/2017   Past Medical History:  Diagnosis Date   Anemia    Asthma    flare up last yr   CHF (congestive heart failure) ([facility])    Chronic headache    DOE (dyspnea on exertion)    2D ECHO, 02/12/2012 - EF 60-65%, moderate  concentric hypertrophy   Fibromyalgia    nerve pain"left side at waist level" "can't lay on that side without pain" , "HOB elevation helps"   Heart murmur    Hematuria - cause not known    History of kidney stones    x 2 '13, '14 surgery to remove   Hypertension    SBO (small bowel obstruction) ([place]) 06/07/2013   Sleep apnea    tested several times and came up negative   Thyroid disease    "goiter"   Transfusion history    10 yrs+    Family History:  Family History  Problem Relation Age of Onset   Diabetes Mother  Epilepsy Mother    Cancer Mother        Breast   Hypertension Mother    Breast cancer Mother    Kidney disease Father    Diabetes Father    Hypertension Father    Asthma Father    Heart disease Father    Epilepsy Sister    Cancer Maternal Grandmother    Breast cancer Maternal Grandmother    Cancer Paternal Grandmother    Breast cancer Paternal Grandmother     Social History: She is married and has 3 children.  Per chart review she is a never smoker, and does not use alcohol or illicit drugs.  Review of Systems:  A complete ROS was negative except as per HPI.   Physical Exam: Blood pressure 121/79, pulse (!) 105, temperature (S) 99.8 F (37.7 C), temperature source (S) Rectal, resp. rate (!) 21, height 5\' 5"  (1.651 m), weight 132 kg, SpO2 100 %. General: Morbidly obese female lying in bed in no acute distress. HEENT: Normocephalic and atraumatic.  PERRLA. Cardiovascular: Tachycardic.  Irregularly irregular rhythm. Pulmonary: Auscultated lung fields anteriorly.  Normal breath sounds.  Normal work of breathing. Abdominal: Soft, nontender to palpation, no masses appreciated. MSK: No lower extremity edema, erythema, or warmth noted bilaterally. Skin: Warm and dry Neurologic: She briefly awakens to verbal and tactile stimuli, then closes eyes.  Tracks from right to left to verbal stimuli.  Withdrawals to painful stimuli.   EKG:  personally reviewed my interpretation is atrial fibrillation with RVR.   CXR: personally reviewed my interpretation is mild vascular congestion, stable cardiomegaly, clear lung fields.   Head CT:  IMPRESSION: 1. Acute nonhemorrhagic left cerebellar infarct.  Assessment & Plan by Problem: Active Problems:   * No active hospital problems. *  Jillian Eaton is a 68 year old female with history of seizures not on meds, atrial fibrillation, chronic diastolic heart failure, HTN, morbid obesity, and iatrogenic hypothyroidism presented after having seizure-like activity and was found to have an acute nonhemorrhagic left cerebellar infarct.   Acute nonhemorrhagic left cerebellar infarct: Appreciate neurology recommendations. - Loaded with Keppra 1 g then start 500 mg every 12 hours - EEG for seizure evaluation - Seizure precautions - MRI of the brain without contrast - MRA head and neck - TTE - Start Lovenox subcu, pending swallow eval will restart home Eliquis - Lipid A1c and lipid profile -Continuous cardiac monitoring - Frequent neurochecks - Keep n.p.o. until passes stroke swallow screen  Atrial fibrillation: Heart rate within acceptable limits. - Continue home Eliquis  Hypertension: Within acceptable limits. Will hold home carvedilol and furosemide for now. -Continue to monitor  Chronic diastolic heart failure: Meds include carvedilol and furosemide.  She appears euvolemic on exam. - Hold home meds for now.  Iatrogenic hypothyroidism: -Continue home levothyroxine  FEN/GI: N.p.o. DVT prophylaxis: Lovenox subcu CODE STATUS: Full  Dispo: Admit patient to Inpatient with expected length of stay greater than 2 midnights.  Signed: Carroll Sage, MD 05/26/2018, 5:14 PM  Pager: [phone]

## 2018-05-26 NOTE — Progress Notes (Signed)
Arrived from ER via stretcher; patient is alert; EEG tech. Here to apply EEG; oriented to room and unit routine; report given to 7pm RN to review orders for admission.

## 2018-05-26 NOTE — ED Provider Notes (Signed)
[facility] EMERGENCY DEPARTMENT Provider Note   CSN: [id number] Arrival date & time: 05/26/18  1430    History   Chief Complaint No chief complaint on file.   HPI Jillian Eaton is a 68 y.o. female.     Patient is a 68 year old female with a history of A. fib on Eliquis, CHF, hypertension, super morbid obesity, thyroid disease who is presenting today with EMS for altered mental status.  Patient was seen in the emergency room yesterday after having 36 hours of shortness of breath and fever up to 101.  In the emergency department all of her labs and imaging appeared normal as well as her oxygen saturation was normal and she was discharged home.  Husband reports that last night she seemed her normal self but when she woke up this morning she was not her normal self.  They said she just was not acting right and was staring off in space.  When paramedics arrived patient was staring to the right and then proceeded to have a tonic-clonic seizure that lasted approximately 2 minutes with generalized shaking and biting of her tongue.  She then progressed to become apneic requiring bagging but was breathing on a nonrebreather upon arrival here and was somewhat responsive.  Approximately 10 minutes after arriving here patient had a second seizure which is more focal on the right side with jerking of the right upper and lower extremity with some tongue biting.  This lasted less than a minute.  Patient never fully woke up but is breathing spontaneously on her room and does respond to pain.  Patient has a history of seizures on her medical history but does not take seizure medication.  She is on Eliquis and there have been no reports of head injury.  The history is provided by the patient.    Past Medical History:  Diagnosis Date   Anemia    Asthma    flare up last yr   CHF (congestive heart failure) (HCC)    Chronic headache    DOE (dyspnea on exertion)    2D ECHO,  02/12/2012 - EF 60-65%, moderate concentric hypertrophy   Fibromyalgia    nerve pain"left side at waist level" "can't lay on that side without pain" , "HOB elevation helps"   Heart murmur    Hematuria - cause not known    History of kidney stones    x 2 '13, '14 surgery to remove   Hypertension    SBO (small bowel obstruction) ([place]) 06/07/2013   Sleep apnea    tested several times and came up negative   Thyroid disease    "goiter"   Transfusion history    10 yrs+    Patient Active Problem List   Diagnosis Date Noted   Rotator cuff tear arthropathy of right shoulder 04/05/2018   Overactive bladder 04/05/2018   Insomnia 04/05/2018   Steroid-induced hyperglycemia    Acute blood loss as cause of postoperative anemia    Generalized OA    Super obese    S/P shoulder replacement, 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/2018   NICM (nonischemic cardiomyopathy) ([place]) 01/15/2018   Osteoporosis 12/13/2017   Chronic diastolic CHF (congestive heart failure) ([place]) 09/28/2017   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   Hypercapnemia 07/13/2017   AKI (acute kidney injury) ([place]) 07/10/2017   On anticoagulant therapy 06/19/2017   GERD (gastroesophageal reflux disease) 08/28/2016   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   Depression 08/28/2016   Iatrogenic hypocalcemia 08/28/2016   Chronic pain  syndrome    Fibromyalgia    Asthma    Acute diastolic (congestive) heart failure (HCC)    Atrial fibrillation ([place]) 04/28/2016   Vocal cord dysfunction 04/28/2016   Thrombocytopenia ([place]) 04/28/2016   Seizures ([place])    Preoperative cardiovascular examination    Leg swelling 10/19/2015   Acute on chronic respiratory failure with hypoxia and hypercapnia (HCC) 08/03/2015   Bilateral leg edema 05/11/2015   Essential hypertension 05/11/2015   Pulmonary hypertension ([place]) 01/08/2013   Anemia of chronic disease 03/28/2011   Morbid obesity ([place]) 09/22/2010    Past  Surgical History:  Procedure Laterality Date   CARDIAC CATHETERIZATION  04/04/2010   No significant obstructive coronary artery disease   CARDIOVERSION N/A 08/08/2017   Procedure: CARDIOVERSION;  Surgeon: Pixie Casino, MD;  Location: [facility];  Service: Cardiovascular;  Laterality: N/A;   CHOLECYSTECTOMY  1990   COLONOSCOPY W/ POLYPECTOMY     COLONOSCOPY WITH PROPOFOL N/A 04/10/2014   Procedure: COLONOSCOPY WITH PROPOFOL;  Surgeon: Beryle Beams, MD;  Location: [facility];  Service: Endoscopy;  Laterality: N/A;   DIAGNOSTIC LAPAROSCOPY     x2 bowel obstructions(adhesions)   EYE SURGERY     lasik 20-25 yrs ago   GASTROPLASTY  1985   "weigh loss", 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      Home Medications    Prior to Admission medications   Medication Sig Start Date End Date Taking? Authorizing Provider  acetaminophen (TYLENOL) 650 MG CR tablet Take 1,300 mg by mouth 3 (three) times daily. At 6 am, 2 pm, and 10 pm    [provider]  apixaban (ELIQUIS) 5 MG TABS tablet Take 1 tablet (5 mg total) by mouth 2 (two) times daily. 04/05/18   Hennie Duos, MD  calcitRIOL (ROCALTROL) 0.5 MCG capsule TAKE 1 CAPSULE BY MOUTH 2 TIMES DAILY. Patient taking differently: Take 0.5 mcg by mouth 2 (two) times daily.  04/12/18   Philemon Kingdom, MD  calcium carbonate (OS-CAL - DOSED IN MG OF ELEMENTAL CALCIUM) 1250 (500 Ca) MG tablet Take 2 tablets (1,000 mg of elemental calcium total) by mouth 2 (two) times daily. Patient taking differently: Take 2 tablets by mouth 2 (two) times daily with a meal.  04/05/18   Hennie Duos, MD  carvedilol (COREG) 3.125 MG tablet Take 1 tablet (3.125 mg total)  by mouth 2 (two) times daily with a meal. 04/05/18   Hennie Duos, MD  clotrimazole (LOTRIMIN AF) 1 % cream Apply 1 application topically 2 (two) times daily. 05/21/18   Marzetta Board, DPM  cyanocobalamin (,VITAMIN B-12,) 1000 MCG/ML injection Inject 1 mL (1,000 mcg total) into the muscle every 30 (thirty) days. 04/05/18   Hennie Duos, MD  cyclobenzaprine (FLEXERIL) 10 MG tablet Take 2 tablets (20 mg total) by mouth 2 (two) times daily. 04/05/18   Hennie Duos, MD  diclofenac sodium (VOLTAREN) 1 % GEL Apply 2 g topically 2 (two) times daily as needed (for knee pain). 04/05/18   Hennie Duos, MD  DULoxetine (CYMBALTA) 60 MG capsule Take 1 capsule (60 mg total) by mouth daily. 04/05/18   Hennie Duos, MD  eszopiclone (  LUNESTA) 2 MG TABS tablet Take 1 tablet (2 mg total) by mouth at bedtime. 04/05/18   Hennie Duos, MD  FERREX 150 150 MG capsule Take 150 mg by mouth 2 (two) times daily. 05/01/18   [provider]  furosemide (LASIX) 20 MG tablet Take 1 tablet (20 mg total) by mouth daily. 04/05/18   Hennie Duos, MD  levothyroxine (SYNTHROID, LEVOTHROID) 200 MCG tablet Take 1 tablet (200 mcg total) by mouth daily before breakfast. 04/05/18   Hennie Duos, MD  LYRICA 200 MG capsule Take 1 capsule (200 mg total) by mouth 2 (two) times daily. 04/05/18   Hennie Duos, MD  magnesium oxide (MAG-OX) 400 MG tablet Take 1 tablet (400 mg total) by mouth daily. 04/05/18   Hennie Duos, MD  methocarbamol (ROBAXIN) 500 MG tablet Take 1 tablet (500 mg total) by mouth 3 (three) times daily as needed. 04/05/18   Hennie Duos, MD  mirabegron ER (MYRBETRIQ) 50 MG TB24 tablet Take 1 tablet (50 mg total) by mouth daily. 04/05/18   Hennie Duos, MD  NONFORMULARY OR COMPOUNDED ITEM [facility]:  Antifungal Cream - Terbinafine 3%, Fluconazole 2%, Tea Tree Oil 5%, Urea 10%, Ibuprofen 2% in DMSO Suspension #61ml. Apply to affected toenail(s) once daily (at  bedtime) or twice daily. 05/07/18   Marzetta Board, DPM  oxycodone (OXY-IR) 5 MG capsule Take 1 capsule (5 mg total) by mouth 2 (two) times daily. At 10 am and 6 pm 04/05/18   Hennie Duos, MD  senna (SENOKOT) 8.6 MG TABS tablet Take 1 tablet (8.6 mg total) by mouth daily. 04/05/18   Hennie Duos, MD  traMADol (ULTRAM) 50 MG tablet Take 50 mg by mouth every 6 (six) hours as needed for moderate pain.  04/30/18   [provider]  traZODone (DESYREL) 50 MG tablet Take 1 tablet (50 mg total) by mouth at bedtime. 04/05/18   Hennie Duos, MD  Vitamin D, Ergocalciferol, (DRISDOL) 1.25 MG (50000 UT) CAPS capsule Take 1 capsule (50,000 Units total) by mouth every Wednesday. 04/10/18   Hennie Duos, MD    Family History Family History  Problem Relation Age of Onset   Diabetes Mother    Epilepsy Mother    Cancer Mother        Breast   Hypertension Mother    Breast cancer Mother    Kidney disease Father    Diabetes Father    Hypertension Father    Asthma Father    Heart disease Father    Epilepsy Sister    Cancer Maternal Grandmother    Breast cancer Maternal Grandmother    Cancer Paternal Grandmother    Breast cancer Paternal Grandmother     Social History Social History   Tobacco Use   Smoking status: Never Smoker   Smokeless tobacco: Never Used  Substance Use Topics   Alcohol use: No    Comment: wine occ   Drug use: No    Types: Oxycodone    Comment: perscribed     Allergies   Norco [hydrocodone-acetaminophen]   Review of Systems Review of Systems  Unable to perform ROS: Mental status change     Physical Exam Updated Vital Signs BP 119/83    Pulse (!) 137    Temp 99.8 F (37.7 C) (Oral)    Resp 20    Ht 5\' 5"  (1.651 m)    Wt 132 kg    SpO2 100%  BMI 48.43 kg/m   Physical Exam Vitals signs and nursing note reviewed.  Constitutional:      General: She is in acute distress.     Appearance: She is well-developed. She  is obese.  HENT:     Head: Normocephalic and atraumatic.     Mouth/Throat:     Comments: Bite marks on right side of tongue Eyes:     Pupils: Pupils are equal, round, and reactive to light.  Cardiovascular:     Rate and Rhythm: Tachycardia present. Rhythm irregularly irregular.     Heart sounds: Normal heart sounds. No murmur. No friction rub.  Pulmonary:     Effort: Pulmonary effort is normal.     Breath sounds: Normal breath sounds. No wheezing or rales.  Abdominal:     General: Bowel sounds are normal. There is no distension.     Palpations: Abdomen is soft.     Tenderness: There is no abdominal tenderness. There is no guarding or rebound.     Comments: Multiple well healed abd scars  Musculoskeletal: Normal range of motion.        General: No tenderness.     Comments: No edema  Skin:    General: Skin is warm and dry.     Findings: No rash.  Neurological:     Mental Status: She is lethargic.     Comments: Seems to be moving the right side more than left but will withdrawal some with left side.  Pt does not respond to or follow commands.  She is breathing spontaneously      ED Treatments / Results  Labs (all labs ordered are listed, but only abnormal results are displayed) Labs Reviewed  CBC WITH DIFFERENTIAL/PLATELET - Abnormal; Notable for the following components:      Result Value   MCHC 29.7 (*)    All other components within normal limits  COMPREHENSIVE METABOLIC PANEL - Abnormal; Notable for the following components:   Glucose, Bld 141 (*)    Creatinine, Ser 1.21 (*)    Calcium 8.7 (*)    GFR calc non Af Amer 47 (*)    GFR calc Af Amer 54 (*)    All other components within normal limits  BRAIN NATRIURETIC PEPTIDE - Abnormal; Notable for the following components:   B Natriuretic Peptide 336.6 (*)    All other components within normal limits  LACTIC ACID, PLASMA - Abnormal; Notable for the following components:   Lactic Acid, Venous 9.0 (*)    All other  components within normal limits  URINE CULTURE  CULTURE, BLOOD (ROUTINE X 2)  CULTURE, BLOOD (ROUTINE X 2)  TROPONIN I  URINALYSIS, ROUTINE W REFLEX MICROSCOPIC    EKG EKG Interpretation  Date/Time:  Sunday May 26 2018 14:40:53 EDT Ventricular Rate:  128 PR Interval:    QRS Duration: 92 QT Interval:  334 QTC Calculation: 488 R Axis:   68 Text Interpretation:  Atrial fibrillation with rapid ventricular response Ventricular premature complex Aberrant complex Borderline repolarization abnormality Borderline prolonged QT interval Confirmed by Blanchie Dessert [phone]) on 05/26/2018 3:19:23 PM   Radiology Ct Head Wo Contrast  Result Date: 05/26/2018 CLINICAL DATA:  Altered level of consciousness EXAM: CT HEAD WITHOUT CONTRAST TECHNIQUE: Contiguous axial images were obtained from the base of the skull through the vertex without intravenous contrast. COMPARISON:  None. FINDINGS: Brain: Low attenuation in the left cerebellum most concerning for an acute infarct. No evidence of hemorrhage, hydrocephalus, extra-axial collection or mass lesion/mass effect. Vascular:  No hyperdense vessel or unexpected calcification. Skull: No osseous abnormality. Sinuses/Orbits: Visualized paranasal sinuses are clear. Visualized mastoid sinuses are clear. Visualized orbits demonstrate no focal abnormality. Other: None IMPRESSION: 1. Acute nonhemorrhagic left cerebellar infarct. Electronically Signed   By: Kathreen Devoid   On: 05/26/2018 16:21   Dg Chest Port 1 View  Result Date: 05/26/2018 CLINICAL DATA:  Seizure-like activity.  Difficulty breathing. EXAM: PORTABLE CHEST 1 VIEW COMPARISON:  05/25/2018 FINDINGS: Patient is slightly rotated to the right. Lungs are adequately inflated without focal consolidation or effusion. Subtle hazy prominence of the central pulmonary vasculature. Moderate stable cardiomegaly. Remainder of the exam is unchanged. IMPRESSION: Moderate stable cardiomegaly with suggestion of minimal  vascular congestion. Electronically Signed   By: Marin Olp M.D.   On: 05/26/2018 15:30   Dg Chest Port 1 View  Result Date: 05/25/2018 CLINICAL DATA:  68 year old female with shortness of breath. EXAM: PORTABLE CHEST 1 VIEW COMPARISON:  Chest radiograph dated 09/18/2017 FINDINGS: The lungs are clear. There is no pleural effusion or pneumothorax. There is stable cardiomegaly. There is prominence of the bilateral pulmonary arteries suggestive of underlying pulmonary hypertension. Right shoulder arthroplasty. No acute osseous pathology. IMPRESSION: 1. No acute cardiopulmonary process. 2. Cardiomegaly with findings suggestive of underlying pulmonary hypertension. Electronically Signed   By: Anner Crete M.D.   On: 05/25/2018 19:14    Procedures Procedures (including critical care time)  Medications Ordered in ED Medications  LORazepam (ATIVAN) injection 1 mg (1 mg Intravenous Given 05/26/18 1511)     Initial Impression / Assessment and Plan / ED Course  I have reviewed the triage vital signs and the nursing notes.  Pertinent labs & imaging results that were available during my care of the patient were reviewed by me and considered in my medical decision making (see chart for details).       68 year old female presenting today with altered mental status and witnessed seizure by paramedics.  Patient had a second seizure here and has remained postictal after receiving 1 mg of Ativan IV.  Patient is currently in A. fib RVR with history of atrial fibrillation on Eliquis.  Patient seen yesterday for shortness of breath and fever reported within the last 24 hours.  Patient is intermittently coughing here on exam and breath sounds are abnormal with rales and rhonchi.  Patient is morbidly obese and there is no noted pitting edema in her lower extremities.  Looking through medical records she did have a shoulder surgery which required rehab in mid February but is currently living at home.  She does  have a history of seizures in the past but does not take any medications for this.  Patient's chest x-ray today shows creatinine of 1.21 with normal LFTs and electrolytes, CBC without acute findings, lactate and BMP are pending.  Head CT is pending.  Concern for head bleed, increased ICP, sepsis, corona virus.  Pt on 6L now and sats are 98%.  Rectal temp is 99.8.  4:19 PM Spoke with patient's daughter Essie Christine on the phone and she reports that the last time her mother had a seizure was in 2017 but she was never put on seizure medications.  She states over the last few days she has had hot and cold chills but no documented fever.  She states her mom always has issues with shortness of breath but she is not sure if it is really any worse in the last few days.  She has had no positive coronavirus exposures.  She  does go to the grocery store but the groceries are loaded in the back of her car and she does not go in.  Nobody in the home is ill.   After speaking with daughter low concern for COVID at this time.    4:28 PM Patient's A. fib RVR is improved with rate now in the low 100s.  Patient's sat is 100% and wean down oxygen.  Patient is still very groggy but is moving all extremities.  Feel that patient's lactate of 9 is related to her seizure not for sepsis.  Patient CT today shows an acute nonhemorrhagic left cerebellar infarct.  Will consult neurology.  5:20 PM Patient is starting to wake up and become more arousable.  She is starting to answer some questions.  CRITICAL CARE Performed by: Kadesia Robel Total critical care time: 40 minutes Critical care time was exclusive of separately billable procedures and treating other patients. Critical care was necessary to treat or prevent imminent or life-threatening deterioration. Critical care was time spent personally by me on the following activities: development of treatment plan with patient and/or surrogate as well as nursing, discussions with  consultants, evaluation of patient's response to treatment, examination of patient, obtaining history from patient or surrogate, ordering and performing treatments and interventions, ordering and review of laboratory studies, ordering and review of radiographic studies, pulse oximetry and re-evaluation of patient's condition.  Lanaya Bennis Brinlee was evaluated in Emergency Department on 05/26/2018 for the symptoms described in the history of present illness. She was evaluated in the context of the global COVID-19 pandemic, which necessitated consideration that the patient might be at risk for infection with the SARS-CoV-2 virus that causes COVID-19. Institutional protocols and algorithms that pertain to the evaluation of patients at risk for COVID-19 are in a state of rapid change based on information released by regulatory bodies including the CDC and federal and state organizations. These policies and algorithms were followed during the patient's care in the ED.   Final Clinical Impressions(s) / ED Diagnoses   Final diagnoses:  Seizure [facility])  Cerebrovascular accident (CVA), unspecified mechanism [facility])    ED Discharge Orders    None       Blanchie Dessert, MD 05/26/18 1721

## 2018-05-26 NOTE — Progress Notes (Signed)
STAT EEG completed; results pending. Dr Kirkpatrick notified. 

## 2018-05-26 NOTE — ED Triage Notes (Signed)
Per EMS:  Pt here for seizure like activity.  EMS reported upon their arrival pt was unresponsive with a right sided gaze.  Pt began to have what EMS reports as a 2-3 minute full body seizure then became apneic, EMS began assisting with ventilations.  No recent falls reported by family.  LSN 05/25/18 1900

## 2018-05-26 NOTE — Procedures (Signed)
History: 68 year old female being evaluated for seizures  Sedation: None, but she did receive Ativan earlier  Technique: This is a 21 channel routine scalp EEG performed at the bedside with bipolar and monopolar montages arranged in accordance to the international 10/20 system of electrode placement. One channel was dedicated to EKG recording.    Background: The predominance of this EEG is recorded during drowsiness and sleep.  With stimulation, sleep structures dissipate, and are replaced with a poorly formed and poorly sustained posterior dominant rhythm of 9 Hz with superimposed generalized irregular delta and theta activities.  Sleep appears relatively normal, rhythmic theta bursts of drowsiness are observed which are normal variant.  Photic stimulation: Physiologic driving is not performed  EEG Abnormalities: 1) generalized irregular slow activity  Clinical Interpretation: This EEG is consistent with a generalized nonspecific cerebral dysfunction (encephalopathy).  Though nonspecific, the relative lack of waking state recording can be consistent with sedation.  Slowing can also be seen with postictal state.  There was no seizure or seizure predisposition recorded on this study. Please note that lack of epileptiform activity on EEG does not preclude the possibility of epilepsy.   Roland Rack, MD [facility] [phone]  If 7pm- 7am, please page neurology on call as listed in [place].

## 2018-05-26 NOTE — Progress Notes (Addendum)
Pt off unit during time of 2230 [facility]. Will obtain upon return. Pt not a good historian at this time, therefore admin not complete.

## 2018-05-26 NOTE — ED Notes (Signed)
Patient transported to CT 

## 2018-05-26 NOTE — Progress Notes (Signed)
ANTICOAGULATION CONSULT NOTE - Initial Consult  Pharmacy Consult for Apixaban Indication: atrial fibrillation  Allergies  Allergen Reactions  . Norco [Hydrocodone-Acetaminophen] Itching    Patient Measurements: Height: 5\' 5"  (165.1 cm) Weight: 270 lb 1 oz (122.5 kg) IBW/kg (Calculated) : 57   Vital Signs: Temp: 98.8 F (37.1 C) (04/05 2001) Temp Source: Axillary (04/05 2001) BP: 134/93 (04/05 2001) Pulse Rate: 84 (04/05 2001)  Labs: Recent Labs    05/25/18 1929 05/25/18 2045 05/26/18 1514 05/26/18 1710  HGB 12.0 12.6 12.4 13.3  HCT 37.9 37.0 41.7 39.0  PLT 180  --  175  --   CREATININE 0.91  --  1.21*  --   TROPONINI <0.03  --  <0.03  --     Estimated Creatinine Clearance: 60.9 mL/min (A) (by C-G formula based on SCr of 1.21 mg/dL (H)).   Medical History: Past Medical History:  Diagnosis Date  . Anemia   . Asthma    flare up last yr  . CHF (congestive heart failure) ([place])   . Chronic headache   . DOE (dyspnea on exertion)    2D ECHO, 02/12/2012 - EF 60-65%, moderate concentric hypertrophy  . Fibromyalgia    nerve pain"left side at waist level" "can't lay on that side without pain" , "HOB elevation helps"  . Heart murmur   . Hematuria - cause not known   . History of kidney stones    x 2 '13, '14 surgery to remove  . Hypertension   . SBO (small bowel obstruction) ([place]) 06/07/2013  . Sleep apnea    tested several times and came up negative  . Thyroid disease    "goiter"  . Transfusion history    10 yrs+    Medications:  Scheduled:  . [START ON 05/27/2018] apixaban  5 mg Oral BID    Assessment: Patient is a 68 year old female presenting here for seizures. Patient is on PTA apixaban 5mg  twice daily for atrial fibrillation. Patient arrived to ED today around 1400. Pharmcay consulted to re-start apixaban. Last dose of apixaban unknown to date. Spoke with Dr.Dorrell who requested apixaban be started in the morning to due patient sleeping and unable to  complete a swallow-evaluation at this time. No other anticoagulation has been started for this patient while admitted.   Hgb stable this afternoon at 12.4, hct 41.7, platelets 175. No signs or symptoms of bleeding per nurse.     Goal of Therapy:  Anticoagulation Monitor platelets by anticoagulation protocol: Yes   Plan:  Start Apixaban 5mg  PO twice daily tomorrow morning (05/27/18) Follow-up signs/symptoms of bleeding, renal function, PO toleration.   Thank you for involving pharmacy in this patient's care.  Tamela Gammon, PharmD 05/26/2018 9:56 PM PGY-1 Pharmacy Resident Direct Phone: [phone] Please check [email or web address] for unit-specific pharmacist phone numbers

## 2018-05-26 NOTE — Consult Note (Signed)
Neurology Consultation  Reason for Consult: Seizure/ Stroke   Referring Physician: Dr. Maryan Rued  CC: Seizure like activity/AMS    History is obtained from:Chart review- Patient is post ictal during the assessment and family is unable to be reached for additional history   HPI: Jillian Eaton is a 68 y.o. female with Morbid Obesity, HTN,  OSA, Seizure (not on an AED), hypoxia on 2L supplemental O2 , afib on Apixaban presents to the hospital via EMS. She was seen at [facility] for SOB, temp 101 and flu like symptoms. Apparently her work up was unremarkable and she was sent home.  Apparently her night was fine, but she woke up this morning not feeling like herself, "sometimes staring off into space."  Per notation  "paramedics arrived patient was staring to the right and then proceeded to have a tonic-clonic seizure that lasted approximately 2 minutes with generalized shaking and biting of her tongue." Upon arrival she once again had an episode with right sided upper and lower ext jerking.  Per ER staff she has experienced approximately 4 seizure episodes since admission. She received Ativan and a 1 gram load of Keppra in the ED.  CT Head w/o contrast revealed acute nonhemorrhagic left cerebellar infarct.   ROS: She is unable to provide a ROS due to altered mental status.   Past Medical History:  Diagnosis Date  . Anemia   . Asthma    flare up last yr  . CHF (congestive heart failure) ([place])   . Chronic headache   . DOE (dyspnea on exertion)    2D ECHO, 02/12/2012 - EF 60-65%, moderate concentric hypertrophy  . Fibromyalgia    nerve pain"left side at waist level" "can't lay on that side without pain" , "HOB elevation helps"  . Heart murmur   . Hematuria - cause not known   . History of kidney stones    x 2 '13, '14 surgery to remove  . Hypertension   . SBO (small bowel obstruction) ([place]) 06/07/2013  . Sleep apnea    tested several times and came up negative  .  Thyroid disease    "goiter"  . Transfusion history    10 yrs+   4-Needs assistance to walk and tend to bodily needs Acute respiratory failure with hypoxia, Chronic systolic (congestive) heart failure, Chronic atrial fibrillation and Morbid Obesity(BMI > 40) HTN  Family History  Problem Relation Age of Onset  . Diabetes Mother   . Epilepsy Mother   . Cancer Mother        Breast  . Hypertension Mother   . Breast cancer Mother   . Kidney disease Father   . Diabetes Father   . Hypertension Father   . Asthma Father   . Heart disease Father   . Epilepsy Sister   . Cancer Maternal Grandmother   . Breast cancer Maternal Grandmother   . Cancer Paternal Grandmother   . Breast cancer Paternal Grandmother      Social History:   reports that she has never smoked. She has never used smokeless tobacco. She reports that she does not drink alcohol or use drugs.  Medications  Current Facility-Administered Medications:  .  acetaminophen (TYLENOL) tablet 650 mg, 650 mg, Oral, Q6H PRN **OR** acetaminophen (TYLENOL) suppository 650 mg, 650 mg, Rectal, Q6H PRN, Chundi, Vahini, MD .  Derrill Memo ON 05/27/2018] levETIRAcetam (KEPPRA) IVPB 500 mg/100 mL premix, 500 mg, Intravenous, Q12H, Kerney Elbe, MD .  promethazine (PHENERGAN) tablet 12.5 mg, 12.5 mg,  Oral, Q6H PRN, Chundi, Vahini, MD .  senna-docusate (Senokot-S) tablet 1 tablet, 1 tablet, Oral, QHS PRN, Chundi, Vahini, MD  Current Outpatient Medications:  .  acetaminophen (TYLENOL) 650 MG CR tablet, Take 1,300 mg by mouth 3 (three) times daily. At 6 am, 2 pm, and 10 pm, Disp: , Rfl:  .  apixaban (ELIQUIS) 5 MG TABS tablet, Take 1 tablet (5 mg total) by mouth 2 (two) times daily., Disp: 60 tablet, Rfl: 0 .  calcitRIOL (ROCALTROL) 0.5 MCG capsule, TAKE 1 CAPSULE BY MOUTH 2 TIMES DAILY. (Patient taking differently: Take 0.5 mcg by mouth 2 (two) times daily. ), Disp: 180 capsule, Rfl: 0 .  calcium carbonate (OS-CAL - DOSED IN MG OF ELEMENTAL CALCIUM)  1250 (500 Ca) MG tablet, Take 2 tablets (1,000 mg of elemental calcium total) by mouth 2 (two) times daily. (Patient taking differently: Take 2 tablets by mouth 2 (two) times daily with a meal. ), Disp: 120 tablet, Rfl: 0 .  carvedilol (COREG) 3.125 MG tablet, Take 1 tablet (3.125 mg total) by mouth 2 (two) times daily with a meal., Disp: 60 tablet, Rfl: 0 .  clotrimazole (LOTRIMIN AF) 1 % cream, Apply 1 application topically 2 (two) times daily., Disp: 30 g, Rfl: 1 .  cyanocobalamin (,VITAMIN B-12,) 1000 MCG/ML injection, Inject 1 mL (1,000 mcg total) into the muscle every 30 (thirty) days., Disp: 1 mL, Rfl: 0 .  cyclobenzaprine (FLEXERIL) 10 MG tablet, Take 2 tablets (20 mg total) by mouth 2 (two) times daily., Disp: 80 tablet, Rfl: 0 .  diclofenac sodium (VOLTAREN) 1 % GEL, Apply 2 g topically 2 (two) times daily as needed (for knee pain)., Disp: 100 g, Rfl: 0 .  DULoxetine (CYMBALTA) 60 MG capsule, Take 1 capsule (60 mg total) by mouth daily., Disp: 30 capsule, Rfl: 0 .  eszopiclone (LUNESTA) 2 MG TABS tablet, Take 1 tablet (2 mg total) by mouth at bedtime., Disp: 20 tablet, Rfl: 0 .  FERREX 150 150 MG capsule, Take 150 mg by mouth 2 (two) times daily., Disp: , Rfl:  .  furosemide (LASIX) 20 MG tablet, Take 1 tablet (20 mg total) by mouth daily., Disp: 30 tablet, Rfl: 0 .  levothyroxine (SYNTHROID, LEVOTHROID) 200 MCG tablet, Take 1 tablet (200 mcg total) by mouth daily before breakfast., Disp: 30 tablet, Rfl: 0 .  LYRICA 200 MG capsule, Take 1 capsule (200 mg total) by mouth 2 (two) times daily., Disp: 40 capsule, Rfl: 0 .  magnesium oxide (MAG-OX) 400 MG tablet, Take 1 tablet (400 mg total) by mouth daily., Disp: 30 tablet, Rfl: 0 .  methocarbamol (ROBAXIN) 500 MG tablet, Take 1 tablet (500 mg total) by mouth 3 (three) times daily as needed., Disp: 60 tablet, Rfl: 0 .  mirabegron ER (MYRBETRIQ) 50 MG TB24 tablet, Take 1 tablet (50 mg total) by mouth daily., Disp: 30 tablet, Rfl: 0 .   NONFORMULARY OR COMPOUNDED ITEM,  Apothecary:  Antifungal Cream - Terbinafine 3%, Fluconazole 2%, Tea Tree Oil 5%, Urea 10%, Ibuprofen 2% in DMSO Suspension #33ml. Apply to affected toenail(s) once daily (at bedtime) or twice daily., Disp: 30 each, Rfl: 11 .  oxycodone (OXY-IR) 5 MG capsule, Take 1 capsule (5 mg total) by mouth 2 (two) times daily. At 10 am and 6 pm, Disp: 20 capsule, Rfl: 0 .  senna (SENOKOT) 8.6 MG TABS tablet, Take 1 tablet (8.6 mg total) by mouth daily., Disp: 120 each, Rfl: 0 .  traMADol (ULTRAM) 50 MG tablet, Take 50  mg by mouth every 6 (six) hours as needed for moderate pain. , Disp: , Rfl:  .  traZODone (DESYREL) 50 MG tablet, Take 1 tablet (50 mg total) by mouth at bedtime., Disp: 30 tablet, Rfl: 0 .  Vitamin D, Ergocalciferol, (DRISDOL) 1.25 MG (50000 UT) CAPS capsule, Take 1 capsule (50,000 Units total) by mouth every Wednesday., Disp: 4 capsule, Rfl: 0   Exam: Current vital signs: BP 129/87   Pulse (!) 104   Temp (S) 99.8 F (37.7 C) (Rectal)   Resp (!) 21   Ht 5\' 5"  (1.651 m)   Wt 132 kg   SpO2 100%   BMI 48.43 kg/m  Vital signs in last 24 hours: Temp:  [99.6 F (37.6 C)-99.8 F (37.7 C)] 99.8 F (37.7 C) (04/05 1548) Pulse Rate:  [77-137] 104 (04/05 1745) Resp:  [16-25] 21 (04/05 1745) BP: (99-173)/(77-117) 129/87 (04/05 1745) SpO2:  [94 %-100 %] 100 % (04/05 1745) Weight:  [132 kg] 132 kg (04/05 1529)  GENERAL: lethargic  HEENT: - Normocephalic and atraumatic, dry mm, no LN++, no Thyromegally LUNGS - Clear to auscultation bilaterally with no wheezes CV - S1S2 RRR, no m/r/g, equal pulses bilaterally. ABDOMEN - Soft, nontender, obese  Ext: warm, well perfused, intact peripheral pulses, LE edema bilaterally   NEURO:  Mental Status: Answers with whisper and falls back asleep, can recite her name. Unable to answer questions or follow most commands.   Cranial Nerves: PERRL 2 mm/sluggish. Tracks horizontally to right and left. No facial  asymmetry, facial sensation intact, hearing intact to voice, head midline Motor: moves all 4 extremities spontaneously and to noxious stimuli without asymmetry. Does follow detailed motor commands except for gripping examiner's hands and flexing at elbows bilaterally with 4+/5 strength bilaterally Sensation- withdrawal ext's to noxious stimuli  DTR:: 2+ upper ext. Patella hypo bilaterally in the context of morbid obesity  Coordination: unable to assess currently  Gait- deferred    Labs I have reviewed labs in epic and the results pertinent to this consultation are: No leukocytosis WBC 7.4, Cr 1.21, BNP 336.6, Lactic acid 9.0 ABG 7.43 -Compensated  PCO2 47 HC03 33  CBC    Component Value Date/Time   WBC 7.4 05/26/2018 1514   RBC 4.42 05/26/2018 1514   HGB 13.3 05/26/2018 1710   HGB 10.4 (L) 06/19/2017 1222   HGB 11.2 (L) 10/25/2012 1431   HCT 39.0 05/26/2018 1710   HCT 35.9 06/19/2017 1222   HCT 34.8 10/25/2012 1431   PLT 175 05/26/2018 1514   PLT 216 06/19/2017 1222   MCV 94.3 05/26/2018 1514   MCV 88 06/19/2017 1222   MCV 94.1 10/25/2012 1431   MCH 28.1 05/26/2018 1514   MCHC 29.7 (L) 05/26/2018 1514   RDW 15.1 05/26/2018 1514   RDW 18.1 (H) 06/19/2017 1222   RDW 15.5 (H) 10/25/2012 1431   LYMPHSABS 0.8 05/26/2018 1514   LYMPHSABS 1.2 10/25/2012 1431   MONOABS 0.2 05/26/2018 1514   MONOABS 0.6 10/25/2012 1431   EOSABS 0.0 05/26/2018 1514   EOSABS 0.2 10/25/2012 1431   BASOSABS 0.0 05/26/2018 1514   BASOSABS 0.0 10/25/2012 1431    CMP     Component Value Date/Time   NA 139 05/26/2018 1710   NA 146 (H) 06/19/2017 1222   K 4.6 05/26/2018 1710   CL 102 05/26/2018 1514   CO2 22 05/26/2018 1514   GLUCOSE 141 (H) 05/26/2018 1514   BUN 12 05/26/2018 1514   BUN 21 06/19/2017 1222  CREATININE 1.21 (H) 05/26/2018 1514   CREATININE 1.01 (H) 11/27/2017 1029   CALCIUM 8.7 (L) 05/26/2018 1514   CALCIUM 5.5 (LL) 07/10/2017 2104   PROT 7.2 05/26/2018 1514   PROT 6.8  06/19/2017 1222   ALBUMIN 3.6 05/26/2018 1514   ALBUMIN 3.8 06/19/2017 1222   AST 31 05/26/2018 1514   ALT 13 05/26/2018 1514   ALKPHOS 77 05/26/2018 1514   BILITOT 0.5 05/26/2018 1514   BILITOT 0.4 06/19/2017 1222   GFRNONAA 47 (L) 05/26/2018 1514   GFRNONAA 58 (L) 11/27/2017 1029   GFRAA 54 (L) 05/26/2018 1514   GFRAA 68 11/27/2017 1029    Lipid Panel     Component Value Date/Time   TRIG 64 04/23/2016 2001     Imaging I have reviewed the images obtained:  CT-scan of the brain Possible subacute nonhemorrhagic left cerebellar infarct.   Assessment:  68 y.o. female with morbid obesity,.HTN  OSA, Seizure (not on an AED), hypoxia on 2L supplemental O2 , afib on Apixaban who presents to the hospital via EMS for seizure like activity (reported about 4 episodnes total). CT scan reveals possible left cerebellar infarct  1. Possible subacute nonhemorrhagic left cerebellar infarct. Will need to obtain MRI brain. Also obtain CTA of head and neck with TTE to complete stroke work up, although may be low yield as etiology of cardioembolic infarction secondary to atrial fibrillation is relatively high on the DDx 2. Recurrent seizures. Not on an anticonvulsant at home.   Recommendations: # Load with Keppra 1 gram then 500 mg Q12 hrs  # EEG for seizure evaluation  # seizure precautions  # MRI of the brain without contrast - may need open MRI # CTA Head and neck  # Transthoracic Echo  # Continue Eliquis - Relatively low risk of bleeding of the cerebellar hypodensity, which is also an equivocal finding not definitively representing an infarction.   # BP management. Hold off on permissive HTN as unclear if the lesion on CT is actually a stroke or chronic small vessel ischemic change # HBAIC and Lipid profile # Telemetry monitoring # Frequent neuro checks # NPO until passes stroke swallow screen # Please page stroke NP  Or  PA  Or MD from 8am -4 pm  as this patient from this time will be   followed by the stroke.   You can look them up on [email or web address]  Password [email or web address]  I have seen and examined the patient. I have formulated the assessment and plan. 68 year old female with recurrent seizures. History of seizures but not on an anticonvulsant at home. Starting Keppra. Continue Eliquis for stroke prevention in the setting of atrial fibrillation. Work up to include EEG and MRI.  Electronically signed: Dr. Kerney Elbe

## 2018-05-26 NOTE — Progress Notes (Signed)
Patient was unable to complete MRI, but images were sent over for review per radiologist Dr Collins [place].  Patient was calm until moved onto MRI table and began rolling over and trying to sit up.  When scan began patient was moving head, trying to roll over and using arm to pull at the head coil.  Scan stopped prematurely as patient became more agitated as it went on.

## 2018-05-27 ENCOUNTER — Inpatient Hospital Stay (HOSPITAL_COMMUNITY): Payer: Medicare Other

## 2018-05-27 ENCOUNTER — Encounter (HOSPITAL_COMMUNITY): Payer: Self-pay | Admitting: Radiology

## 2018-05-27 DIAGNOSIS — E032 Hypothyroidism due to medicaments and other exogenous substances: Secondary | ICD-10-CM

## 2018-05-27 DIAGNOSIS — I4891 Unspecified atrial fibrillation: Secondary | ICD-10-CM

## 2018-05-27 DIAGNOSIS — Z7901 Long term (current) use of anticoagulants: Secondary | ICD-10-CM

## 2018-05-27 DIAGNOSIS — I110 Hypertensive heart disease with heart failure: Secondary | ICD-10-CM

## 2018-05-27 DIAGNOSIS — Z79890 Hormone replacement therapy (postmenopausal): Secondary | ICD-10-CM

## 2018-05-27 DIAGNOSIS — Z79899 Other long term (current) drug therapy: Secondary | ICD-10-CM

## 2018-05-27 DIAGNOSIS — I5032 Chronic diastolic (congestive) heart failure: Secondary | ICD-10-CM

## 2018-05-27 DIAGNOSIS — Z8669 Personal history of other diseases of the nervous system and sense organs: Secondary | ICD-10-CM

## 2018-05-27 DIAGNOSIS — R1310 Dysphagia, unspecified: Secondary | ICD-10-CM

## 2018-05-27 DIAGNOSIS — I639 Cerebral infarction, unspecified: Secondary | ICD-10-CM

## 2018-05-27 DIAGNOSIS — G9340 Encephalopathy, unspecified: Secondary | ICD-10-CM

## 2018-05-27 LAB — LIPID PANEL
Cholesterol: 150 mg/dL (ref 0–200)
HDL: 62 mg/dL (ref >40)
LDL Cholesterol: 78 mg/dL (ref 0–99)
Total CHOL/HDL Ratio: 2.4 RATIO
Triglycerides: 50 mg/dL (ref <150)
VLDL: 10 mg/dL (ref 0–40)

## 2018-05-27 LAB — GLUCOSE, CAPILLARY
Glucose-Capillary: 58 mg/dL — ABNORMAL LOW (ref 70–99)
Glucose-Capillary: 70 mg/dL (ref 70–99)
Glucose-Capillary: 52 mg/dL — ABNORMAL LOW (ref 70–99)
Glucose-Capillary: 77 mg/dL (ref 70–99)
Glucose-Capillary: 71 mg/dL (ref 70–99)

## 2018-05-27 LAB — CBC
HCT: 37.1 % (ref 36.0–46.0)
Hemoglobin: 11.8 g/dL — ABNORMAL LOW (ref 12.0–15.0)
MCH: 29.6 pg (ref 26.0–34.0)
MCHC: 31.8 g/dL (ref 30.0–36.0)
MCV: 93.2 fL (ref 80.0–100.0)
Platelets: 160 10*3/uL (ref 150–400)
RBC: 3.98 MIL/uL (ref 3.87–5.11)
RDW: 15.3 % (ref 11.5–15.5)
WBC: 8.5 10*3/uL (ref 4.0–10.5)
nRBC: 0 % (ref 0.0–0.2)

## 2018-05-27 LAB — ECHOCARDIOGRAM LIMITED
Height: 65 in
Weight: 4345.71 oz

## 2018-05-27 LAB — BASIC METABOLIC PANEL
Anion gap: 12 (ref 5–15)
BUN: 10 mg/dL (ref 8–23)
CO2: 24 mmol/L (ref 22–32)
Calcium: 8.5 mg/dL — ABNORMAL LOW (ref 8.9–10.3)
Chloride: 104 mmol/L (ref 98–111)
Creatinine, Ser: 0.94 mg/dL (ref 0.44–1.00)
GFR calc Af Amer: >60 mL/min (ref >60)
GFR calc non Af Amer: >60 mL/min (ref >60)
Glucose, Bld: 88 mg/dL (ref 70–99)
Potassium: 3.4 mmol/L — ABNORMAL LOW (ref 3.5–5.1)
Sodium: 140 mmol/L (ref 135–145)

## 2018-05-27 LAB — HEMOGLOBIN A1C
Hgb A1c MFr Bld: 4.6 % — ABNORMAL LOW (ref 4.8–5.6)
Mean Plasma Glucose: 85.32 mg/dL

## 2018-05-27 MED ORDER — SODIUM CHLORIDE 0.9% FLUSH: 10.0000 mL | INTRAVENOUS | Status: DC | PRN

## 2018-05-27 MED ORDER — SODIUM CHLORIDE 0.9% FLUSH
10.0000 mL | Freq: Two times a day (BID) | INTRAVENOUS | Status: DC
  Administered 2018-05-27: 10 mL
  Administered 2018-05-27: 40 mL
  Administered 2018-05-28 – 2018-05-29 (×3): 10 mL

## 2018-05-27 MED ORDER — IOHEXOL 350 MG/ML SOLN: 75.0000 mL | Freq: Once | INTRAVENOUS | Status: DC | PRN

## 2018-05-27 MED ORDER — LEVOTHYROXINE SODIUM 100 MCG PO TABS
200.0000 ug | ORAL_TABLET | Freq: Every day | ORAL | Status: DC
  Administered 2018-05-28 – 2018-05-30 (×3): 200 ug via ORAL
  Filled 2018-05-27 (×3): qty 2

## 2018-05-27 MED ORDER — CARVEDILOL 3.125 MG PO TABS
3.1250 mg | ORAL_TABLET | Freq: Two times a day (BID) | ORAL | Status: DC
  Administered 2018-05-27 – 2018-05-28 (×2): 3.125 mg via ORAL
  Filled 2018-05-27 (×2): qty 1

## 2018-05-27 MED ORDER — FUROSEMIDE 20 MG PO TABS
20.0000 mg | ORAL_TABLET | Freq: Every day | ORAL | Status: DC
  Administered 2018-05-27 – 2018-05-28 (×2): 20 mg via ORAL
  Filled 2018-05-27 (×2): qty 1

## 2018-05-27 MED ORDER — ENSURE ENLIVE PO LIQD
237.0000 mL | Freq: Two times a day (BID) | ORAL | Status: DC
  Administered 2018-05-27 – 2018-05-30 (×5): 237 mL via ORAL

## 2018-05-27 MED ORDER — APIXABAN 5 MG PO TABS: 5.0000 mg | ORAL_TABLET | Freq: Two times a day (BID) | ORAL | Status: DC

## 2018-05-27 NOTE — Evaluation (Signed)
Occupational Therapy Evaluation Patient Details Name: Jillian Eaton [facility] MRN: [id number] DOB: 1950-08-21 Today's Date: 05/27/2018    History of Present Illness Patient is a 68 y/o female who presents with AMS and seizure like activity. EEG-generalized nonspecific cerebral dysfunction. Head CT-left cerebellar infarct. Admitted with new onset seizures, postitcal. Recent admission to ED on 4/6 for flu like symptoms. PMH includes fibromyalgia, CHF, pulmonary HTN, morbid obesity, seizures, A-fib, chronic pain syndrome, acute diastolic HF.   Clinical Impression   This 68 y/o female presents with the above. At baseline pt reports she is mod independent with ADL and functional mobility using RW. Pt presenting with impaired cognition, decreased activity tolerance and generalized weakness. Pt requiring two person assist for completion of stand pivot transfers this session using RW; she currently requires minA for seated UB ADL, maxA (+2) for LB and toileting ADL. Pt pleasant during session but with confusion noted given most recent events and timeframe. Pt HR up to 150s with standing activity, SpO2 down to high 80s on RA. She will benefit from continued acute OT therapy services and feel she is appropriate candidate for CIR level therapy services at time of discharge to maximize her safety and independence with ADL and mobility prior to return home. Will follow.     Follow Up Recommendations  CIR;Supervision/Assistance - 24 hour    Equipment Recommendations  Other (comment)(defer to next venue)           Precautions / Restrictions Precautions Precautions: Fall Precaution Comments: watch HR Restrictions Weight Bearing Restrictions: No      Mobility Bed Mobility Overal bed mobility: Needs Assistance Bed Mobility: Supine to Sit     Supine to sit: Min assist;HOB elevated     General bed mobility comments: Cues for technique, use of rail and assist to get to EOB.   Transfers Overall  transfer level: Needs assistance Equipment used: Rolling walker (2 wheeled) Transfers: Stand Pivot Transfers;Sit to/from Stand Sit to Stand: Max assist;Mod assist;+2 physical assistance Stand pivot transfers: Mod assist;Min assist;+2 physical assistance;+2 safety/equipment       General transfer comment: Initially Max A of 2 to stand from EOB with cues for hand placement (LUE falling off RW handle) and use of momentum- posterior lean with RW coming off ground, stood again with Mod A of 2 from EOB x1, from [facility] x1. SPT bed to [facility] Mod A of 2 witjh assist for RW management.     Balance Overall balance assessment: Needs assistance Sitting-balance support: Feet supported;No upper extremity supported Sitting balance-Leahy Scale: Fair     Standing balance support: During functional activity Standing balance-Leahy Scale: Poor Standing balance comment: Requires UE support in standing and Min A for balance.                            ADL either performed or assessed with clinical judgement   ADL Overall ADL's : Needs assistance/impaired Eating/Feeding: Set up;Sitting   Grooming: Set up;Sitting   Upper Body Bathing: Minimal assistance;Sitting   Lower Body Bathing: Moderate assistance;Maximal assistance;+2 for physical assistance;+2 for safety/equipment;Sit to/from stand   Upper Body Dressing : Minimal assistance;Sitting   Lower Body Dressing: Maximal assistance;+2 for physical assistance;+2 for safety/equipment;Sit to/from stand Lower Body Dressing Details (indicate cue type and reason): min-modA standing balance Toilet Transfer: Moderate assistance;+2 for physical assistance;+2 for safety/equipment;Stand-pivot;RW Toilet Transfer Details (indicate cue type and reason): increased assist due to urgency and pt with difficulty managing RW Toileting- Clothing  Manipulation and Hygiene: Maximal assistance;+2 for physical assistance;+2 for safety/equipment;Sit to/from stand Toileting -  Clothing Manipulation Details (indicate cue type and reason): pt with some incontinence of bowel start of session, transferred to [facility] for further toileting, assist for peri-care after BM in standing with two person assist;      Functional mobility during ADLs: Moderate assistance;+2 for physical assistance;+2 for safety/equipment;Rolling walker General ADL Comments: pt with weakness, decreased activity tolerance, impaired cognition     Vision Baseline Vision/History: Wears glasses Wears Glasses: Reading only Patient Visual Report: No change from baseline Additional Comments: will continue to assess, no visual deficits noted during this session     Perception     Praxis      Pertinent Vitals/Pain Pain Assessment: No/denies pain Faces Pain Scale: Hurts little more Pain Location: knees with mobility Pain Descriptors / Indicators: Constant;Sore Pain Intervention(s): Monitored during session;Repositioned     Hand Dominance Right   Extremity/Trunk Assessment Upper Extremity Assessment Upper Extremity Assessment: Generalized weakness;RUE deficits/detail;LUE deficits/detail RUE Deficits / Details: grossly 3-/5 throughout only able to perform shoulder ROM to grossly 80*; generalized weakness  RUE Coordination: decreased gross motor LUE Deficits / Details: grossly 3-/5 throughout only able to perform shoulder ROM to grossly 80*; generalized weakness  LUE Coordination: decreased gross motor   Lower Extremity Assessment Lower Extremity Assessment: Defer to PT evaluation       Communication Communication Communication: No difficulties   Cognition Arousal/Alertness: Awake/alert Behavior During Therapy: WFL for tasks assessed/performed Overall Cognitive Status: Impaired/Different from baseline Area of Impairment: Orientation;Memory;Problem solving                 Orientation Level: Disoriented to;Time;Situation   Memory: Decreased short-term memory       Problem Solving:  Requires verbal cues General Comments: "it is not April" "no way, seriously, it is not April" . Does not even recall coming to ED at [facility] the day before admission due to flu like symptoms. Does not recall any MDs or SLP coming in room earlier in AM.   General Comments  HR up to 150s bpm and SpO2 decreased to high 80s on RA with activity. BP 153/100 end of session    Exercises     Shoulder Instructions      Home Living Family/patient expects to be discharged to:: Private residence Living Arrangements: Spouse/significant other Available Help at Discharge: Family;Available 24 hours/day Type of Home: House Home Access: Level entry     Home Layout: One level     Bathroom Shower/Tub: Occupational psychologist: Handicapped height Bathroom Accessibility: Yes   Home Equipment: Environmental consultant - 2 wheels;Adaptive equipment;Grab bars - toilet;Grab bars - tub/shower Adaptive Equipment: Reacher;Sock aid;Long-handled shoe horn;Long-handled sponge        Prior Functioning/Environment Level of Independence: Independent with assistive device(s)        Comments: Pt reports she was performing ADLs mod I, and ambulated with RW         OT Problem List: Decreased strength;Decreased range of motion;Decreased activity tolerance;Impaired balance (sitting and/or standing);Decreased cognition;Decreased safety awareness;Decreased knowledge of use of DME or AE;Obesity      OT Treatment/Interventions: Self-care/ADL training;Therapeutic exercise;Neuromuscular education;DME and/or AE instruction;Therapeutic activities;Cognitive remediation/compensation;Patient/family education;Balance training    OT Goals(Current goals can be found in the care plan section) Acute Rehab OT Goals Patient Stated Goal: to get better OT Goal Formulation: With patient Time For Goal Achievement: 06/10/18 Potential to Achieve Goals: Good  OT Frequency: Min 2X/week   Barriers to  D/C:            Co-evaluation PT/OT/SLP  Co-Evaluation/Treatment: Yes Reason for Co-Treatment: For patient/therapist safety;To address functional/ADL transfers PT goals addressed during session: Mobility/safety with mobility;Balance OT goals addressed during session: ADL's and self-care;Proper use of Adaptive equipment and DME      AM-PAC OT "6 Clicks" Daily Activity     Outcome Measure Help from another person eating meals?: A Little Help from another person taking care of personal grooming?: A Little Help from another person toileting, which includes using toliet, bedpan, or urinal?: A Lot Help from another person bathing (including washing, rinsing, drying)?: A Lot Help from another person to put on and taking off regular upper body clothing?: A Lot Help from another person to put on and taking off regular lower body clothing?: A Lot 6 Click Score: 14   End of Session Equipment Utilized During Treatment: Gait belt;Rolling walker Nurse Communication: Mobility status  Activity Tolerance: Patient tolerated treatment well Patient left: in chair;with call bell/phone within reach;with chair alarm set  OT Visit Diagnosis: Muscle weakness (generalized) (M62.81);Unsteadiness on feet (R26.81);Other symptoms and signs involving cognitive function                Time: 1205-1237 OT Time Calculation (min): 32 min Charges:  OT General Charges $OT Visit: 1 Visit OT Evaluation $OT Eval Moderate Complexity: 1 Mod  Lou Cal, OT [employer] Pager [phone] Office [phone]   Raymondo Band 05/27/2018, 2:23 PM

## 2018-05-27 NOTE — Progress Notes (Addendum)
NEUROLOGY PROGRESS NOTE  Subjective: Patient has no complaints.  No further seizures noted.  She is however still confused and encephalopathic.  Exam: Vitals:   05/27/18 0609 05/27/18 0839  BP: (!) 151/104 (!) (P) 142/100  Pulse: 95 (P) 75  Resp: 20 (P) 18  Temp: 97.8 F (36.6 C) (P) 98.7 F (37.1 C)  SpO2: 100% (P) 100%    Physical Exam   HEENT-  Normocephalic, no lesions, without obvious abnormality.  Normal external eye and conjunctiva.   Extremities- Warm, dry and intact Musculoskeletal-no joint tenderness, deformity or swelling Skin-warm and dry, no hyperpigmentation, vitiligo, or suspicious lesions    Neuro:  Mental Status: Patient is alert however she is not oriented and confused.  She is able to follow commands but does not know where she is or how she got to where she is.  When asked to lift her legs she said she could not however when asked how she gets around she states that she walks. Cranial Nerves: II:  Visual fields grossly normal,  III,IV, VI: ptosis not present, extra-ocular motions intact bilaterally pupils equal, round, reactive to light and accommodation V,VII: Face symmetric, facial light touch sensation normal bilaterally VIII: hearing normal bilaterally IX,X: Palate rises midline XI: bilateral shoulder shrug XII: midline tongue extension Motor: Moving all 4 extremities however her upper extremities greater than her lower extremities.  Upper extremities are 4/5.  Lower extremities she is able to lift off the bed but is having difficulty understanding examiner's request Sensory: Pinprick and light touch intact throughout, bilaterally Deep Tendon Reflexes: 2+ and symmetric throughout upper extremities however lower extremities was not obtained secondary to significant obesity in these limbs Plantars: Right: downgoing   Left: downgoing Cerebellar: normal finger-to-nose,    Medications:  Scheduled: . apixaban  5 mg Oral BID  . chlorhexidine  15 mL  Mouth Rinse BID  . mouth rinse  15 mL Mouth Rinse q12n4p  . sodium chloride flush  10-40 mL Intracatheter Q12H    Pertinent Labs/Diagnostics: FXT:[id number] Interpretation: This EEG is consistent with a generalized nonspecific cerebral dysfunction (encephalopathy).  Though nonspecific, the relative lack of waking state recording can be consistent with sedation.  Slowing can also be seen with postictal state.  There was no seizure or seizure predisposition recorded on this study. Please note that lack of epileptiform activity on EEG does not preclude the possibility of epilepsy.   -LDL 78 - A1c 4.6 - Echocardiogram done pending final reading  Ct Head Wo Contrast  Result Date: 05/26/2018 CLINICAL DATA:  Altered level of consciousness EXAM: CT HEAD WITHOUT CONTRAST TECHNIQUE: Contiguous axial images were obtained from the base of the skull through the vertex without intravenous contrast. COMPARISON:  None. FINDINGS: Brain: Low attenuation in the left cerebellum most concerning for an acute infarct. No evidence of hemorrhage, hydrocephalus, extra-axial collection or mass lesion/mass effect. Vascular: No hyperdense vessel or unexpected calcification. Skull: No osseous abnormality. Sinuses/Orbits: Visualized paranasal sinuses are clear. Visualized mastoid sinuses are clear. Visualized orbits demonstrate no focal abnormality. Other: None IMPRESSION: 1. Acute nonhemorrhagic left cerebellar infarct. Electronically Signed   By: Kathreen Devoid   On: 05/26/2018 16:21   Mr Brain Wo Contrast  Result Date: 05/26/2018 CLINICAL DATA:  Stroke follow-up EXAM: MRI HEAD WITHOUT CONTRAST TECHNIQUE: Multiplanar, multiecho pulse sequences of the brain and surrounding structures were obtained without intravenous contrast. COMPARISON:  None. FINDINGS: The study is degraded by motion. The examination had to be discontinued prior to completion due to patient altered  mental status and inability to cooperate with technologist's  instructions. Axial and coronal diffusion-weighted imaging and sagittal T1-weighted imaging was obtained. There is no abnormal diffusion restriction to indicate acute ischemia. There is no midline shift or other mass effect. Size and configuration of the ventricles are normal. IMPRESSION: Truncated and motion limited examination without acute ischemia. Electronically Signed   By: Ulyses Jarred M.D.   On: 05/26/2018 23:34   Dg Chest Port 1 View  Result Date: 05/26/2018 CLINICAL DATA:  Seizure-like activity.  Difficulty breathing. EXAM: PORTABLE CHEST 1 VIEW COMPARISON:  05/25/2018 FINDINGS: Patient is slightly rotated to the right. Lungs are adequately inflated without focal consolidation or effusion. Subtle hazy prominence of the central pulmonary vasculature. Moderate stable cardiomegaly. Remainder of the exam is unchanged. IMPRESSION: Moderate stable cardiomegaly with suggestion of minimal vascular congestion. Electronically Signed   By: Marin Olp M.D.   On: 05/26/2018 15:30   Dg Chest Port 1 View  Result Date: 05/25/2018 CLINICAL DATA:  68 year old female with shortness of breath. EXAM: PORTABLE CHEST 1 VIEW COMPARISON:  Chest radiograph dated 09/18/2017 FINDINGS: The lungs are clear. There is no pleural effusion or pneumothorax. There is stable cardiomegaly. There is prominence of the bilateral pulmonary arteries suggestive of underlying pulmonary hypertension. Right shoulder arthroplasty. No acute osseous pathology. IMPRESSION: 1. No acute cardiopulmonary process. 2. Cardiomegaly with findings suggestive of underlying pulmonary hypertension. Electronically Signed   By: Anner Crete M.D.   On: 05/25/2018 19:14     Etta Quill PA-C [facility] [phone]   Assessment: 68 year old female with morbid obesity and hypertension.  Patient arrived secondary to seizure and not on any antiepileptic drugs.  Patient has atrial fibrillation and is on Eliquis.  MRI was obtained which did  not show any acute stroke.   Impression:  - New onset multiple seizures -Encephalopathy  Recommendations: - If possible repeat CTA head neck with mild sedation -Continue Keppra - Continue anticoagulant Eliquis -Per [facility] statutes, patients with seizures are not allowed to drive until  they have been seizure-free for six months. Use caution when using heavy equipment or power tools. Avoid working on ladders or at heights. Take showers instead of baths. Ensure the water temperature is not too high on the home water heater. Do not go swimming alone. When caring for infants or small children, sit down when holding, feeding, or changing them to minimize risk of injury to the child in the event you have a seizure.   Also, Maintain good sleep hygiene. Avoid alcohol.  Dr. Lorraine Lax to follow-up and make adjustments if needed    05/27/2018, 8:57 AM NEUROHOSPITALIST ADDENDUM Performed a face to face diagnostic evaluation.   I have reviewed the contents of history and physical exam as documented by PA/ARNP/Resident and agree with above documentation.  I have discussed and formulated the above plan as documented. Edits to the note have been made as needed.  Patient appears somnolent, but arousable on examination.  MRI negative for acute stroke.  Continue Keppra    Karena Addison Aroor MD [facility] [phone]   If 7pm to 7am, please call on call as listed on AMION.

## 2018-05-27 NOTE — Plan of Care (Signed)
  Problem: Education: Goal: Knowledge of General Education information will improve Description Including pain rating scale, medication(s)/side effects and non-pharmacologic comfort measures Outcome: Progressing   Problem: Health Behavior/Discharge Planning: Goal: Ability to manage health-related needs will improve Outcome: Progressing   Problem: Clinical Measurements: Goal: Ability to maintain clinical measurements within normal limits will improve Outcome: Progressing Goal: Will remain free from infection Outcome: Progressing Goal: Diagnostic test results will improve Outcome: Progressing Goal: Respiratory complications will improve Outcome: Progressing Goal: Cardiovascular complication will be avoided Outcome: Progressing   Problem: Activity: Goal: Risk for activity intolerance will decrease Outcome: Progressing   Problem: Nutrition: Goal: Adequate nutrition will be maintained Outcome: Progressing   Problem: Coping: Goal: Level of anxiety will decrease Outcome: Progressing   Problem: Elimination: Goal: Will not experience complications related to bowel motility Outcome: Progressing Goal: Will not experience complications related to urinary retention Outcome: Progressing   Problem: Pain Managment: Goal: General experience of comfort will improve Outcome: Progressing   Problem: Safety: Goal: Ability to remain free from injury will improve Outcome: Progressing   Problem: Skin Integrity: Goal: Risk for impaired skin integrity will decrease Outcome: Progressing   Problem: Education: Goal: Knowledge of disease or condition will improve Outcome: Progressing Goal: Knowledge of secondary prevention will improve Outcome: Progressing Goal: Knowledge of patient specific risk factors addressed and post discharge goals established will improve Outcome: Progressing   Problem: Health Behavior/Discharge Planning: Goal: Ability to manage health-related needs will  improve Outcome: Progressing   Problem: Self-Care: Goal: Ability to participate in self-care as condition permits will improve Outcome: Progressing Goal: Ability to communicate needs accurately will improve Outcome: Progressing   Problem: Nutrition: Goal: Risk of aspiration will decrease Outcome: Progressing   Problem: Ischemic Stroke/TIA Tissue Perfusion: Goal: Complications of ischemic stroke/TIA will be minimized Outcome: Progressing   Problem: Medication: Goal: Risk for medication side effects will decrease Outcome: Progressing   Problem: Clinical Measurements: Goal: Diagnostic test results will improve Outcome: Progressing   Will conti to monitor.

## 2018-05-27 NOTE — Discharge Instructions (Signed)

## 2018-05-27 NOTE — Progress Notes (Signed)
Dr. Leonel Ramsay informed of pt not having. MD placed NIH order. Suggested to call IM residence to inform d/t not being apart of team. Dr. Annie Paras informed. At 0335 pt AOx 2, still confused about why at [facility] and not sure how she got here. Pt still unable to give good history.  Ival Bible, BSN, RN

## 2018-05-27 NOTE — Progress Notes (Signed)
Rehab Admissions Coordinator Note:  Patient was screened by Cleatrice Burke for appropriateness for an Inpatient Acute Rehab Consult per PT recs.   At this time, we are recommending Inpatient Rehab consult. Please place order for consult.  Cleatrice Burke RN MSN 05/27/2018, 2:02 PM  I can be reached at [phone].

## 2018-05-27 NOTE — Progress Notes (Signed)
PT Cancellation Note  Patient Details Name: Jillian Eaton MRN: [id number] DOB: 28-Nov-1950   Cancelled Treatment:    Reason Eval/Treat Not Completed: Active bedrest order Will await increase in activity orders prior to PT evaluation. Will follow.   Marguarite Arbour A Naara Kelty 05/27/2018, 7:03 AM Wray Kearns, PT, DPT Acute Rehabilitation Services Pager [phone] Office [phone]

## 2018-05-27 NOTE — Progress Notes (Signed)
  Echocardiogram 2D Echocardiogram limited has been performed.  Darlina Sicilian M 05/27/2018, 8:45 AM

## 2018-05-27 NOTE — Evaluation (Signed)
Physical Therapy Evaluation Patient Details Name: Jillian Eaton MRN: [id number] DOB: 1950-03-06 Today's Date: 05/27/2018   History of Present Illness  Patient is a 68 y/o female who presents with AMS and seizure like activity. EEG-generalized nonspecific cerebral dysfunction. Head CT-left cerebellar infarct. Admitted with new onset seizures, postitcal. Recent admission to ED on 4/6 for flu like symptoms. PMH includes fibromyalgia, CHF, pulmonary HTN, morbid obesity, seizures, A-fib, chronic pain syndrome, acute diastolic HF.  Clinical Impression  Patient presents with generalized weakness, impaired cognition, impaired balance and impaired mobility s/p above. Pt mod I PTA using RW for support and lives with spouse. Pt with poor memory and does not recall events leading up to admission or anyone coming in room today. Also shocked to hear it is April as this is her bday month. Requires Min-Mod A of 2 for transfers and gait training today. HR up to 150 bpm with activity. Would benefit from CIR to maximize independence and mobility prior to return home. Will follow acutely.     Follow Up Recommendations CIR;Supervision for mobility/OOB    Equipment Recommendations  Other (comment)(defer)    Recommendations for Other Services Rehab consult     Precautions / Restrictions Precautions Precautions: Fall Precaution Comments: watch HR Restrictions Weight Bearing Restrictions: No      Mobility  Bed Mobility Overal bed mobility: Needs Assistance Bed Mobility: Supine to Sit     Supine to sit: Min assist;HOB elevated     General bed mobility comments: Cues for technique, use of rail and assist to get to EOB.   Transfers Overall transfer level: Needs assistance Equipment used: Rolling walker (2 wheeled) Transfers: Sit to/from Stand Sit to Stand: Max assist;Mod assist         General transfer comment: Initially Max A of 2 to stand from EOB with cues for hand placement (LUE  falling off RW handle) and use of momentum- posterior lean with RW coming off ground, stood again with Mod A of 2 from EOB x1, from [facility] x1. SPT bed to [facility] Mod A of 2 witjh assist for RW management.   Ambulation/Gait Ambulation/Gait assistance: Min assist;+2 safety/equipment Gait Distance (Feet): 5 Feet Assistive device: Rolling walker (2 wheeled) Gait Pattern/deviations: Wide base of support;Trunk flexed;Decreased stride length Gait velocity: decreased   General Gait Details: Able to take a few steps to get to chair with Min A for balance, lines and RW management. HR up to 150s bpm. SP02 dropped to high80s on RA, donned 1L/min 02.   Stairs            Wheelchair Mobility    Modified Rankin (Stroke Patients Only) Modified Rankin (Stroke Patients Only) Pre-Morbid Rankin Score: Moderate disability Modified Rankin: Moderately severe disability     Balance Overall balance assessment: Needs assistance Sitting-balance support: Feet supported;No upper extremity supported Sitting balance-Leahy Scale: Fair     Standing balance support: During functional activity Standing balance-Leahy Scale: Poor Standing balance comment: Requires UE support in standing and Min A for balance.                              Pertinent Vitals/Pain Pain Assessment: Faces Faces Pain Scale: Hurts little more Pain Location: knees with mobility Pain Descriptors / Indicators: Constant;Sore Pain Intervention(s): Monitored during session;Repositioned    Home Living Family/patient expects to be discharged to:: Private residence Living Arrangements: Spouse/significant other Available Help at Discharge: Family;Available 24 hours/day Type of Home: House Home  Access: Level entry     Home Layout: One level Home Equipment: Walker - 2 wheels;Adaptive equipment;Grab bars - toilet;Grab bars - tub/shower      Prior Function Level of Independence: Independent with assistive device(s)          Comments: Pt reports she was performing ADLs mod I, and ambulated with RW      Hand Dominance   Dominant Hand: Right    Extremity/Trunk Assessment   Upper Extremity Assessment Upper Extremity Assessment: Defer to OT evaluation    Lower Extremity Assessment Lower Extremity Assessment: Generalized weakness(Grossly ~4/5 throughout, sensation [facility].)       Communication   Communication: No difficulties  Cognition Arousal/Alertness: Awake/alert Behavior During Therapy: WFL for tasks assessed/performed Overall Cognitive Status: Impaired/Different from baseline Area of Impairment: Orientation;Memory;Problem solving                 Orientation Level: Disoriented to;Time;Situation   Memory: Decreased short-term memory       Problem Solving: Requires verbal cues General Comments: "it is not April" "no way, seriously, it is not April" . Does not even recall coming to ED at [facility] the day before admission due to flu like symptoms. Does not recall any MDs or SLP coming inr oom earlier in AM.      General Comments General comments (skin integrity, edema, etc.): HR elevated to 150s bpm and Sp02 dropped to high 80s on RA.     Exercises     Assessment/Plan    PT Assessment Patient needs continued PT services  PT Problem List Decreased strength;Decreased balance;Decreased cognition;Pain;Obesity;Cardiopulmonary status limiting activity;Decreased mobility;Decreased activity tolerance;Decreased coordination       PT Treatment Interventions Functional mobility training;Balance training;Patient/family education;Gait training;Therapeutic activities;Therapeutic exercise;Neuromuscular re-education;Cognitive remediation    PT Goals (Current goals can be found in the Care Plan section)  Acute Rehab PT Goals Patient Stated Goal: to get better PT Goal Formulation: With patient Time For Goal Achievement: 06/10/18 Potential to Achieve Goals: Good    Frequency Min 4X/week   Barriers  to discharge        Co-evaluation PT/OT/SLP Co-Evaluation/Treatment: Yes Reason for Co-Treatment: To address functional/ADL transfers;For patient/therapist safety PT goals addressed during session: Mobility/safety with mobility;Balance         AM-PAC PT "6 Clicks" Mobility  Outcome Measure Help needed turning from your back to your side while in a flat bed without using bedrails?: A Little Help needed moving from lying on your back to sitting on the side of a flat bed without using bedrails?: A Little Help needed moving to and from a bed to a chair (including a wheelchair)?: A Lot Help needed standing up from a chair using your arms (e.g., wheelchair or bedside chair)?: A Lot Help needed to walk in hospital room?: A Little Help needed climbing 3-5 steps with a railing? : A Lot 6 Click Score: 15    End of Session Equipment Utilized During Treatment: Gait belt;Oxygen Activity Tolerance: Patient tolerated treatment well Patient left: in chair;with call bell/phone within reach;with chair alarm set Nurse Communication: Mobility status PT Visit Diagnosis: Muscle weakness (generalized) (M62.81);Difficulty in walking, not elsewhere classified (R26.2);Unsteadiness on feet (R26.81);Pain Pain - Right/Left: (bil) Pain - part of body: Knee    Time: 1206-1238 PT Time Calculation (min) (ACUTE ONLY): 32 min   Charges:   PT Evaluation $PT Eval Moderate Complexity: 1 Mod          Mylo Red, PT, DPT Acute Rehabilitation Services Pager [phone] Office [phone]  Blake Divine A Claretha Townshend 05/27/2018, 1:24 PM

## 2018-05-27 NOTE — Progress Notes (Signed)
Initial Nutrition Assessment   RD working remotely.  DOCUMENTATION CODES:   Morbid obesity  INTERVENTION:  Provide Ensure Enlive po BID, each supplement provides 350 kcal and 20 grams of protein  Encourage adequate PO intake.   NUTRITION DIAGNOSIS:   Inadequate oral intake related to dysphagia, lethargy/confusion as evidenced by (per report).  GOAL:   Patient will meet greater than or equal to 90% of their needs  MONITOR:   PO intake, Supplement acceptance, Labs, Weight trends, I & O's, Skin, Diet advancement  REASON FOR ASSESSMENT:   Low Braden    ASSESSMENT:   68 year old female with morbid obesity and hypertension.  Patient arrived secondary to seizure and not on any antiepileptic drugs.  Patient has atrial fibrillation and is on Eliquis.  MRI was obtained which did not show any acute stroke.   Per MD, pt confused and encephalopathic. RD unable to obtain pt nutrition history. Pt is currently on a dysphagia 3 diet with thin liquids. Per SLP, pt with mild oral dysphagia likely related to lethargy and weakness. RD to order nutritional supplements to aid in caloric and protein needs.   Unable to complete Nutrition-Focused physical exam at this time.   Labs and medications reviewed.   Diet Order:   Diet Order            DIET DYS 3 Room service appropriate? Yes; Fluid consistency: Thin  Diet effective now              EDUCATION NEEDS:   Not appropriate for education at this time  Skin:  Skin Assessment: Reviewed RN Assessment  Last BM:  4/6  Height:   Ht Readings from Last 1 Encounters:  05/26/18 5\' 5"  (1.651 m)    Weight:   Wt Readings from Last 1 Encounters:  05/27/18 123.2 kg    Ideal Body Weight:  56.8 kg  BMI:  Body mass index is 45.2 kg/m.  Estimated Nutritional Needs:   Kcal:  1850-2050  Protein:  90-110 grams  Fluid:  1.8 -2 L/day    Corrin Parker, MS, RD, LDN Pager # [phone] After hours/ weekend pager # [phone]

## 2018-05-27 NOTE — Progress Notes (Signed)
OT Cancellation Note  Patient Details Name: Jillian Eaton MRN: [id number] DOB: [birth year]   Cancelled Treatment:    Reason Eval/Treat Not Completed: Active bedrest order; will await updated activity orders prior to OT eval. Will follow.  Lou Cal, OT Supplemental Rehabilitation Services Pager [phone] Office [phone]   Raymondo Band 05/27/2018, 8:22 AM

## 2018-05-27 NOTE — Progress Notes (Signed)
   Subjective: Jillian Eaton was doing better today, she was more alert and awake. She was oriented to person and place but not to time. She reported that she has never had a seizure. She denies any cough or congestion, denies any pain at the moment. We discussed the plan for today and she is in agreement, all questions were answered.   Objective:  Vital signs in last 24 hours: Vitals:   05/26/18 2353 05/27/18 0301 05/27/18 0447 05/27/18 0609  BP: (!) 136/97 (!) 149/103  (!) 151/104  Pulse: 81 93  95  Resp: 20 17  20   Temp: 98.7 F (37.1 C) 98.3 F (36.8 C)  97.8 F (36.6 C)  TempSrc: Axillary Axillary  Oral  SpO2: 100% 100%  100%  Weight:   123.2 kg   Height:       General: Middle aged female, NAD, sitting comfortably in bed Cardiac: RRR, no m/r/g  Pulm: CTAB, no rhonchi or wheezing  Abdominal: Soft, non-tender, non-distended Neuro: Awake, oriented x2, EOMI, CN 2-12 intact, finger to nose test intact, sensation to light touch intact, strength 5/5 in upper extremities, unable to test for LE strength due to  lack of effort    Assessment/Plan:  Active Problems:   * No active hospital problems. *  Jillian Eaton is a 68 year old female with history of seizures on antiepileptics, atrial fibrillation, chronic diastolic heart failure, HTN, morbid obesity, and iatrogenic hypothyroidism who presented after having seizure-like activity and found to have a potential acute nonhemorrhagic left cerebellar infarct.  Acute encephalopathy: She is likely in a post ictal state from recurrent seizures. EEG showed generalized nonspecific cerebral dysfunction.  Head CT showed a questionable nonhemorrhagic left cerebellar infarct. Unfortunately brain MRI was degraded by motion and the exam had to be discontinued prior to completion due to patient's altered mental status and inability to cooperate.  There was however no abnormal diffusion restriction to indicate acute ischemia.  It is reassuring that her mental  status has improved from yesterday. - Continue Keppra 500 mg every 12 hours - May try repeating CTA head and neck with mild sedation vs doppler. Will follow-up with neurology for recommendations.  - Echo to be performed today - He passed her swallow study and will restart her home Eliquis. - Follow-up neurology recommendations. - Continuous cardiac monitoring - Seizure precautions  Atrial fibrillation: Heart rates remain within acceptable limits. -Continue home Eliquis  Hypertension: Remains within acceptable limits.  We will continue holding carvedilol and furosemide for now.  Chronic diastolic heart failure: Stable.  Continue to monitor Iatrogenic hypothyroidism: Stable.  Continue home thyroxine.  FEN/GI: Dysphagia 3 diet DVT prophylaxis: Home Eliquis CODE STATUS: Full code  Dispo: Anticipated discharge in approximately 3-5 days.   Carroll Sage, MD 05/27/2018, 7:25 AM Pager: [phone]

## 2018-05-27 NOTE — Progress Notes (Signed)
Pt refusing CPAP at this time. Pt states she does not have sleep apnea. RN aware.

## 2018-05-27 NOTE — Evaluation (Signed)
Clinical/Bedside Swallow Evaluation Patient Details  Name: Jillian Eaton MRN: [id number] Date of Birth: 11/27/1950  Today's Date: 05/27/2018 Time: SLP Start Time (ACUTE ONLY): 0902 SLP Stop Time (ACUTE ONLY): 0919 SLP Time Calculation (min) (ACUTE ONLY): 17 min  Past Medical History:  Past Medical History:  Diagnosis Date  . Anemia   . Asthma    flare up last yr  . CHF (congestive heart failure) ([place])   . Chronic headache   . DOE (dyspnea on exertion)    2D ECHO, 02/12/2012 - EF 60-65%, moderate concentric hypertrophy  . Fibromyalgia    nerve pain"left side at waist level" "can't lay on that side without pain" , "HOB elevation helps"  . Heart murmur   . Hematuria - cause not known   . History of kidney stones    x 2 '13, '14 surgery to remove  . Hypertension   . SBO (small bowel obstruction) ([place]) 06/07/2013  . Sleep apnea    tested several times and came up negative  . Thyroid disease    "goiter"  . Transfusion history    10 yrs+   Past Surgical History:  Past Surgical History:  Procedure Laterality Date  . CARDIAC CATHETERIZATION  04/04/2010   No significant obstructive coronary artery disease  . CARDIOVERSION N/A 08/08/2017   Procedure: CARDIOVERSION;  Surgeon: Pixie Casino, MD;  Location: [place];  Service: Cardiovascular;  Laterality: N/A;  . CHOLECYSTECTOMY  1990  . COLONOSCOPY W/ POLYPECTOMY    . COLONOSCOPY WITH PROPOFOL N/A 04/10/2014   Procedure: COLONOSCOPY WITH PROPOFOL;  Surgeon: Beryle Beams, MD;  Location: [facility];  Service: Endoscopy;  Laterality: N/A;  . DIAGNOSTIC LAPAROSCOPY     x2 bowel obstructions(adhesions)  . EYE SURGERY     lasik 20-25 yrs ago  . 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 REVERSE SHOULDER ARTHROPLASTY Right 03/29/2018   Procedure: REVERSE SHOULDER ARTHROPLASTY;  Surgeon: Netta Cedars, MD;  Location: [place];  Service: Orthopedics;  Laterality: Right;  . THYROIDECTOMY     . TUBAL LIGATION  1986   HPI:  pt is a 68 yo female adm to [facility] with seizure.  Pt found to have left acute nonhemmorhagic cva.  cxr showed stable cardiomegaly. Pt PMH + for Afib, obesity, SOB, CHF, goiter, thyroid disease, FM, heart murmur, asthma, anemia.  Pt bit tongue with seizure and also had a seizure in ED.     Assessment / Plan / Recommendation Clinical Impression  Patient presents with symptom of mild oral dysphagia - likely due to lethargy, weakness and ? impact of cerebellar CVA.  Pt with delayed mastication but no oral residuals.  Did not conduct 3 ounce water test due to her mentation.  Recommend soft/thin diet with strict precautions including medication with applesauce and esophageal precautions.  Anticipate pt's swallow will return to baseline function.  She would benefit from consideration for speech language and cognitive evaluation due to her acute CVA and dysarthria.   SLP Visit Diagnosis: Dysphagia, oral phase (R13.11)    Aspiration Risk  Mild aspiration risk    Diet Recommendation Dysphagia 3 (Mech soft);Thin liquid   Liquid Administration via: Cup;Straw Medication Administration: Whole meds with puree Supervision: Patient able to self feed Compensations: Slow rate;Small sips/bites Postural Changes: Remain upright for at least 30 minutes after po intake;Seated upright at 90 degrees    Other  Recommendations Oral Care Recommendations: Oral care QID   Follow up  Recommendations        Frequency and Duration min 1 x/week  1 week       Prognosis Prognosis for Safe Diet Advancement: Good      Swallow Study   General Date of Onset: 05/27/18 HPI: pt is a 68 yo female adm to [facility] with seizure.  Pt found to have left acute nonhemmorhagic cva.  cxr showed stable cardiomegaly. Pt PMH + for Afib, obesity, SOB, CHF, goiter, thyroid disease, FM, heart murmur, asthma, anemia.  Pt bit tongue with seizure and also had a seizure in ED.   Type of Study: Bedside Swallow  Evaluation Diet Prior to this Study: NPO Temperature Spikes Noted: No Respiratory Status: Nasal cannula History of Recent Intubation: No Behavior/Cognition: Alert;Cooperative;Other (Comment)(sleepy but would respond to SLP) Oral Cavity Assessment: Other (comment)(pt bit tongue causing minmal abrasion) Oral Care Completed by SLP: Yes Oral Cavity - Dentition: Adequate natural dentition(upper partial) Self-Feeding Abilities: Needs set up Patient Positioning: Upright in bed Baseline Vocal Quality: Low vocal intensity Volitional Cough: Weak Volitional Swallow: Unable to elicit    Oral/Motor/Sensory Function Overall Oral Motor/Sensory Function: Within functional limits   Ice Chips Ice chips: Not tested   Thin Liquid Thin Liquid: Impaired Presentation: Straw;Cup;Self Fed Oral Phase Impairments: Reduced labial seal;Other (comment)(pt did not perform seal on straw initially) Other Comments: ? minimal delay in swallow but no indication of aspiration, 3 ounce water test not administered due to pt's weakness    Nectar Thick Nectar Thick Liquid: Not tested   Honey Thick Honey Thick Liquid: Not tested   Puree Puree: Within functional limits Presentation: Self Fed   Solid     Solid: Impaired Oral Phase Impairments: Impaired mastication;Reduced lingual movement/coordination(presumed prolonged mastication)      Macario Golds 05/27/2018,9:35 AM  Luanna Salk, MS [facility] SLP Acute Rehab Services Pager [phone] Office [phone]

## 2018-05-28 ENCOUNTER — Telehealth: Payer: Self-pay | Admitting: Internal Medicine

## 2018-05-28 ENCOUNTER — Encounter (HOSPITAL_COMMUNITY): Payer: Medicare Other

## 2018-05-28 LAB — GLUCOSE, CAPILLARY
Glucose-Capillary: 70 mg/dL (ref 70–99)
Glucose-Capillary: 75 mg/dL (ref 70–99)
Glucose-Capillary: 77 mg/dL (ref 70–99)
Glucose-Capillary: 80 mg/dL (ref 70–99)
Glucose-Capillary: 86 mg/dL (ref 70–99)

## 2018-05-28 MED ORDER — CARVEDILOL 6.25 MG PO TABS
6.2500 mg | ORAL_TABLET | Freq: Two times a day (BID) | ORAL | Status: DC
  Administered 2018-05-28: 6.25 mg via ORAL
  Filled 2018-05-28 (×2): qty 1

## 2018-05-28 MED ORDER — RAMELTEON 8 MG PO TABS
8.0000 mg | ORAL_TABLET | Freq: Every evening | ORAL | Status: DC | PRN
  Administered 2018-05-29 (×2): 8 mg via ORAL
  Filled 2018-05-28 (×4): qty 1

## 2018-05-28 NOTE — Discharge Summary (Signed)
Name: Jillian Eaton [facility] MRN: [id number] DOB: [birth year] 68 y.o. PCP: [name], MD  Date of Admission: 05/26/2018  2:30 PM Date of Discharge: 05/29/18 Attending Physician: Lucious Groves, DO  Discharge Diagnosis: 1.  Seizure disorder, acute post ictal encephalopathy 2.  Paroxysmal atrial fibrillation 3.  Hypertension 4.  Chronic diastolic heart failure 5.  Iatrogenic hypothyroidism  Discharge Medications: Allergies as of 05/30/2018      Reactions   Norco [hydrocodone-acetaminophen] Itching      Medication List    STOP taking these medications   carvedilol 3.125 MG tablet Commonly known as:  COREG   furosemide 20 MG tablet Commonly known as:  LASIX     TAKE these medications   acetaminophen 650 MG CR tablet Commonly known as:  TYLENOL Take 1,300 mg by mouth 3 (three) times daily. At 6 am, 2 pm, and 10 pm Notes to patient:  TAKE AS PRESCRIBED   apixaban 5 MG Tabs tablet Commonly known as:  ELIQUIS Take 1 tablet (5 mg total) by mouth 2 (two) times daily.   calcitRIOL 0.5 MCG capsule Commonly known as:  ROCALTROL TAKE 1 CAPSULE BY MOUTH 2 TIMES DAILY. What changed:  See the new instructions.   calcium carbonate 1250 (500 Ca) MG tablet Commonly known as:  OS-CAL - dosed in mg of elemental calcium Take 2 tablets (1,000 mg of elemental calcium total) by mouth 2 (two) times daily. What changed:  when to take this   clotrimazole 1 % cream Commonly known as:  Lotrimin AF Apply 1 application topically 2 (two) times daily.   cyanocobalamin 1000 MCG/ML injection Commonly known as:  (VITAMIN B-12) Inject 1 mL (1,000 mcg total) into the muscle every 30 (thirty) days.   cyclobenzaprine 10 MG tablet Commonly known as:  FLEXERIL Take 2 tablets (20 mg total) by mouth 2 (two) times daily.   diclofenac sodium 1 % Gel Commonly known as:  VOLTAREN Apply 2 g topically 2 (two) times daily as needed (for knee pain).   DULoxetine 60 MG capsule Commonly known as:   CYMBALTA Take 1 capsule (60 mg total) by mouth daily.   eszopiclone 2 MG Tabs tablet Commonly known as:  LUNESTA Take 1 tablet (2 mg total) by mouth at bedtime.   Ferrex 150 150 MG capsule Generic drug:  iron polysaccharides Take 150 mg by mouth 2 (two) times daily.   levETIRAcetam 500 MG tablet Commonly known as:  KEPPRA Take 1 tablet (500 mg total) by mouth 2 (two) times daily.   levothyroxine 200 MCG tablet Commonly known as:  SYNTHROID, LEVOTHROID Take 1 tablet (200 mcg total) by mouth daily before breakfast.   Lyrica 200 MG capsule Generic drug:  pregabalin Take 1 capsule (200 mg total) by mouth 2 (two) times daily.   magnesium oxide 400 MG tablet Commonly known as:  MAG-OX Take 1 tablet (400 mg total) by mouth daily.   methocarbamol 500 MG tablet Commonly known as:  Robaxin Take 1 tablet (500 mg total) by mouth 3 (three) times daily as needed.   metoprolol succinate 50 MG 24 hr tablet Commonly known as:  TOPROL-XL Take 2 tablets (100 mg total) by mouth daily for 30 days. Take with or immediately following a meal.   mirabegron ER 50 MG Tb24 tablet Commonly known as:  Myrbetriq Take 1 tablet (50 mg total) by mouth daily.   NONFORMULARY OR COMPOUNDED ITEM [facility]:  Antifungal Cream - Terbinafine 3%, Fluconazole 2%, Tea Tree Oil 5%, Urea  10%, Ibuprofen 2% in DMSO Suspension #78ml. Apply to affected toenail(s) once daily (at bedtime) or twice daily.   oxycodone 5 MG capsule Commonly known as:  OXY-IR Take 1 capsule (5 mg total) by mouth 2 (two) times daily. At 10 am and 6 pm   senna 8.6 MG Tabs tablet Commonly known as:  SENOKOT Take 1 tablet (8.6 mg total) by mouth daily.   traMADol 50 MG tablet Commonly known as:  ULTRAM Take 50 mg by mouth every 6 (six) hours as needed for moderate pain.   traZODone 50 MG tablet Commonly known as:  DESYREL Take 1 tablet (50 mg total) by mouth at bedtime.   Vitamin D (Ergocalciferol) 1.25 MG (50000 UT) Caps  capsule Commonly known as:  DRISDOL Take 1 capsule (50,000 Units total) by mouth every Wednesday.            Durable Medical Equipment  (From admission, onward)         Start     Ordered   05/30/18 0000  DME Bedside commode    Question Answer Comment  Patient needs a bedside commode to treat with the following condition Heart failure [facility])   Patient needs a bedside commode to treat with the following condition Seizure [facility])   Patient needs a bedside commode to treat with the following condition Atrial fibrillation ([place])      05/30/18 1053          Disposition and follow-up:   Jillian Eaton was discharged from [facility] in Stable condition.  At the hospital follow up visit please address:  1.  Please evaluate for seizure-like activity and compliance with Keppra medication.  Please also evaluate volume status as Lasix was held in the setting of dehydration and soft blood pressures.  2.  Labs / imaging needed at time of follow-up: None  3.  Pending labs/ test needing follow-up: None  Follow-up Appointments: Follow-up Information    [name], MD. Go on 06/03/2018.   Specialty:  Internal Medicine Why:  The clinic will call you to set up the exact time for the appointment on Monday. Contact information: [place] [address] [place] Hospital Course by problem list: 1.  Seizure disorder, acute post ictal encephalopathy: Jillian Eaton presented after having a seizure.  She was evaluated by neurology and treated with Keppra.  Head CT was suggestive of acute nonhemorrhagic infarct however a motion degraded MRI did not reveal any acute infarct. ECHO was unremarkable.  Throughout her admission her altered mental status improved and prior to discharge she was close to her baseline.  She did continue to have some retrograde amnesia but was overall doing well.  She will need to continue Keppra 500  mg twice daily indefinitely.  Please ensure that she follows up with neurology.  2.  Paroxysmal atrial fibrillation: She developed atrial fibrillation with tachycardia during her admission.  Her carvedilol was initially increased with improvement in her heart rate.  Unfortunately she developed soft blood pressures and her beta-blocker was then switched to metoprolol.  Please monitor for tachycardia and adjust medication as necessary.  Additionally her home apixaban was continued. She be discharged on metoprolol 100 mg daily and apixaban.  3.  Hypertension: She had soft blood pressures and associated dizziness throughout her admission.  She was treated with IV fluids which did improve her blood pressure and resolved dizziness.  Please  check blood pressure and adjust medications as necessary.  4.  Chronic diastolic heart failure: He did appear euvolemic throughout her admission.  Echo did not show any acute changes.  Lasix was held due to soft blood pressures.  Please evaluate volume status and add back Lasix as necessary.  5.  Iatrogenic hypothyroidism: Remained stable throughout admission.  She was continued on home levothyroxine.  Discharge Vitals:   BP 115/83 (BP Location: Left Arm)   Pulse 90   Temp 98.1 F (36.7 C) (Oral)   Resp 20   Ht 5\' 5"  (1.651 m)   Wt 124.2 kg   SpO2 96%   BMI 45.56 kg/m   Pertinent Labs, Studies, and Procedures:  4/4 CXR: IMPRESSION: 1. No acute cardiopulmonary process. 2. Cardiomegaly with findings suggestive of underlying pulmonary Hypertension.  4/5 Head CT:  IMPRESSION:  1. Acute nonhemorrhagic left cerebellar infarct.  4/5 Brain MR: FINDINGS: The study is degraded by motion. The examination had to be discontinued prior to completion due to patient altered mental status and inability to cooperate with technologist's instructions. Axial and coronal diffusion-weighted imaging and sagittal T1-weighted imaging was obtained.  There is no abnormal  diffusion restriction to indicate acute ischemia. There is no midline shift or other mass effect. Size and configuration of the ventricles are normal.  IMPRESSION: Truncated and motion limited examination without acute ischemia.  Discharge Instructions:   Thank you so much for allowing [place] to care for you during your admission. You came to the hospital after having a seizure and was started on a medication called levetiracetam (Keppra). It is very important for you to continue to take this medication to decrease risk of having another seizure. The outpatient neurology clinic will call you to set up an appointment to follow-up. I have also set up a hospital follow-up appointment with your primary care doctor. Please make sure that you do attend this visit.  Also please stop taking your carvedilol (coreg) and Lasix. We have prescribed you a new medication called metoprolol which you will take once a day. Please make sure that you follow-up with your cardiologist and PCP.    Signed: Carroll Sage, MD 05/28/2018, 10:26 AM   Pager: [phone]

## 2018-05-28 NOTE — Progress Notes (Signed)
NEUROLOGY PROGRESS NOTE  Subjective: Patient has improved significantly today.  No further seizures.  Awake, alert, having in-depth conversation with primary team, laughing and joking.  Exam: Vitals:   05/28/18 0345 05/28/18 0745  BP: 97/64 115/83  Pulse: 98 90  Resp: 17 20  Temp: 97.9 F (36.6 C) 98.1 F (36.7 C)  SpO2: 97% 96%    Physical Exam   HEENT-  Normocephalic, no lesions, without obvious abnormality.  Normal external eye and conjunctiva.   Extremities- Warm, dry and intact Musculoskeletal-no joint tenderness, deformity or swelling Skin-warm and dry, no hyperpigmentation, vitiligo, or suspicious lesions    Neuro:  Mental Status: Alert, oriented, thought content appropriate.  Speech fluent without evidence of aphasia.  Able to follow 3 step commands without difficulty. Cranial Nerves: II:  Visual fields grossly normal,  III,IV, VI: ptosis not present, extra-ocular motions intact bilaterally pupils equal, round, reactive to light and accommodation V,VII: smile symmetric, facial light touch sensation normal bilaterally VIII: hearing normal bilaterally IX,X: Palate rises midline XI: bilateral shoulder shrug XII: midline tongue extension Motor: Right : Upper extremity   5/5    Left:     Upper extremity   5/5  Lower extremity   5/5     Lower extremity   5/5 Tone and bulk:normal tone throughout; no atrophy noted Sensory: Pinprick and light touch intact throughout, bilaterally Deep Tendon Reflexes: 2+ and symmetric throughout upper extremities however lower extremities due to obesity of limbs could not obtain deep tendon reflexes     Medications:  Scheduled: . apixaban  5 mg Oral BID  . carvedilol  3.125 mg Oral BID WC  . chlorhexidine  15 mL Mouth Rinse BID  . feeding supplement (ENSURE ENLIVE)  237 mL Oral BID BM  . furosemide  20 mg Oral Daily  . levothyroxine  200 mcg Oral QAC breakfast  . mouth rinse  15 mL Mouth Rinse q12n4p  . sodium chloride flush   10-40 mL Intracatheter Q12H    Pertinent Labs/Diagnostics: - LDL 78 - A1c 4.6 - Blood glucoses ranging in the 70s  WUJ:[id number] Interpretation: ThisEEG is consistent with a generalized nonspecific cerebral dysfunction (encephalopathy).Though nonspecific, the relative lack of waking state recording can be consistent with sedation.Slowing can also be seen with postictal state. There was no seizure or seizure predisposition recorded on this study. Please note that lack of epileptiform activity on EEG does not preclude the possibility of epilepsy.   Echocardiogram: FINDINGS  Left Ventricle: The left ventricle has low normal systolic function, with an ejection fraction of 50-55%. The cavity size was normal. There is mildly increased left ventricular wall thickness. Left ventricular diastolic Doppler parameters are  indeterminate. LV strain and biplane volumes performed but not reported due to interpreter judgement (atrial fibrillation, poor tracking). Right Ventricle: The right ventricle has mildly reduced systolic function. The cavity was not assessed. There is no increase in right ventricular wall thickness. Left Atrium: left atrial size was severely dilated   Right Atrium: right atrial size was severely dilated. Right atrial pressure is estimated at 3 mmHg. Interatrial Septum: The interatrial septum was not well visualized. Pericardium: Trivial pericardial effusion is present. The pericardial effusion is posterior to the left ventricle. Mitral Valve: The mitral valve is normal in structure. Mitral valve regurgitation is mild by color flow Doppler. Tricuspid Valve: The tricuspid valve is not well visualized. Tricuspid valve regurgitation is mild by color flow Doppler. Aortic Valve: The aortic valve was not well visualized Aortic valve regurgitation is  trivial by color flow Doppler. Pulmonic Valve: The pulmonic valve was not well visualized. Pulmonic valve regurgitation is trivial by color  flow Doppler. Venous: The inferior vena cava is normal in size with greater than 50% respiratory variability. Compared to previous exam: Side by side comparison of images performed to 02/01/2018. LV function has not significantly changed, and regional wall motion abnormalities appear unchanged. RVSP has decreased, however TR velocity may have been incompletely  assessed due to poor acoustic windows on today's exam.  Ct Head Eaton Contrast  Result Date: 05/26/2018 CLINICAL DATA:  Altered level of consciousness EXAM: CT HEAD WITHOUT CONTRAST TECHNIQUE: Contiguous axial images were obtained from the base of the skull through the vertex without intravenous contrast. COMPARISON:  None. FINDINGS: Brain: Low attenuation in the left cerebellum most concerning for an acute infarct. No evidence of hemorrhage, hydrocephalus, extra-axial collection or mass lesion/mass effect. Vascular: No hyperdense vessel or unexpected calcification. Skull: No osseous abnormality. Sinuses/Orbits: Visualized paranasal sinuses are clear. Visualized mastoid sinuses are clear. Visualized orbits demonstrate no focal abnormality. Other: None IMPRESSION: 1. Acute nonhemorrhagic left cerebellar infarct. Electronically Signed   By: Kathreen Devoid   On: 05/26/2018 16:21   Jillian Eaton Contrast  Result Date: 05/26/2018 CLINICAL DATA:  Stroke follow-up EXAM: MRI HEAD WITHOUT CONTRAST TECHNIQUE: Multiplanar, multiecho pulse sequences of the brain and surrounding structures were obtained without intravenous contrast. COMPARISON:  None. FINDINGS: The study is degraded by motion. The examination had to be discontinued prior to completion due to patient altered mental status and inability to cooperate with technologist's instructions. Axial and coronal diffusion-weighted imaging and sagittal T1-weighted imaging was obtained. There is no abnormal diffusion restriction to indicate acute ischemia. There is no midline shift or other mass effect. Size and  configuration of the ventricles are normal. IMPRESSION: Truncated and motion limited examination without acute ischemia. Electronically Signed   By: Ulyses Jarred M.D.   On: 05/26/2018 23:34   Dg Chest Port 1 View  Result Date: 05/26/2018 CLINICAL DATA:  Seizure-like activity.  Difficulty breathing. EXAM: PORTABLE CHEST 1 VIEW COMPARISON:  05/25/2018 FINDINGS: Patient is slightly rotated to the right. Lungs are adequately inflated without focal consolidation or effusion. Subtle hazy prominence of the central pulmonary vasculature. Moderate stable cardiomegaly. Remainder of the exam is unchanged. IMPRESSION: Moderate stable cardiomegaly with suggestion of minimal vascular congestion. Electronically Signed   By: Marin Olp M.D.   On: 05/26/2018 15:30     Etta Quill PA-C [facility] [phone]   Assessment: 68 year old female with morbid obesity and hypertension.  Patient arrived secondary to seizure however not on any antiepileptic drugs.  MRI did not show any stroke.  EEG did not show any seizure activity. Impression:  -New onset multiple seizures -Encephalopathy/postictal now resolved  Recommendations: -Continue Keppra - Continue anticoagulant Eliquis - Follow-up with neurology as an outpatient -Per [facility] statutes, patients with seizures are not allowed to drive until  they have been seizure-free for six months. Use caution when using heavy equipment or power tools. Avoid working on ladders or at heights. Take showers instead of baths. Ensure the water temperature is not too high on the home water heater. Do not go swimming alone. When caring for infants or small children, sit down when holding, feeding, or changing them to minimize risk of injury to the child in the event you have a seizure.   Also, Maintain good sleep hygiene. Avoid alcohol.   - At this time I have discussed with primary team,  will sign off, please call with any questions  05/28/2018, 9:11  AM

## 2018-05-28 NOTE — Progress Notes (Signed)
Did orthostatic vital signs at 12:21pm: Lying:  BP126/92  Pulse:93 Sitting:  BP:109/77 Pulse: 98 Standing: BP: 117/106 Pulse: 150  Ambulated at 3:20pm Sitting:  BP: 119/76 Pulse: 92 Walking:  BP: 134/94 Pulse 180

## 2018-05-28 NOTE — Progress Notes (Addendum)
Paged by nurse stating that the patient is tachycardic when she stands up and ambulates. PT noted heartrates ranging 90-175 with ambulation.  Went to patient's bedside and the patient mentioned that she is slightly dizzy and that she feels "like she is outside her body".  On auscultation the patient was in irregularly irregular rhythm and tachycardic.   Review of telemetry showed that the patient was in afib with heart rates ranging 110-150s over the past few hours.   It is possible that the patient maybe having some decreased cerebral blood flow which maybe causing her dizziness.  For the patient's safety will increase her coreg dose and monitor her overnight. Will titrate her coreg for better heart rate control.   -increased coreg from 3.125mg  to 6.25mg  bid  -request physical therapy to continue to follow  -request nurse to ambulate and document vitals with ambulation during each shift -anticipate patient will be more ready for discharge tomorrow 05/29/18 with home health and home pt services.   Lars Mage, MD Internal Medicine PGY2 RFVOH:[phone] 05/28/2018, 4:03 PM

## 2018-05-28 NOTE — Progress Notes (Signed)
   Subjective: Jillian Eaton is a lot more alert and awake today. She reports that she was doing well. She is aware of the Coronavirus. She reports that she is from [place], [place] and her brother spoke with her over the phone and told her all about it. She denied any history of seizures or strokes. She has a family history of seizures, her sister passed away at 78, and her mother also has a history of seizures that started at age 66. She reports that she has been using a walker for years now.   We discussed about what has been going on while she has been hospitalized. We discussed that we have her on a anti-epileptic and that she will continue this when she is discharged and will need to follow up with the neurologist.   We discussed that given her seizure history she can't drive or operate machinery for the next 6 months, she can resume this after the 6 months.   Objective:  Vital signs in last 24 hours: Vitals:   05/27/18 2345 05/28/18 0345 05/28/18 0500 05/28/18 0745  BP: 127/89 97/64  115/83  Pulse: 87 98  90  Resp: (!) 21 17  20   Temp: 98 F (36.7 C) 97.9 F (36.6 C)  98.1 F (36.7 C)  TempSrc: Oral Oral  Oral  SpO2: 100% 97%  96%  Weight:   124.2 kg   Height:        General: Well appearing, pleasant female, NAD Cardiac: Tachycardic, irregularly irregular  Pulmonary: CTABL, no wheezing or rhonchi Abdomen: Soft, non-tender, non-distended Neuro: Awake, alert, oriented x2 (person and place), EOMI, CN 2-12 intact, finger-to-nose intact, sensation to light touch intact, strength 5/5 in upper and lower extremities.  Psychiatry: Normal mood and affect    Assessment/Plan:  Active Problems:   Morbid obesity ([place])   Essential hypertension   Seizures ([place])   Atrial fibrillation ([facility])   Chronic diastolic CHF (congestive heart failure) ([place])  Jillian Eaton is a 68 year old female with history of seizures, atrial fibrillation, chronic diastolic heart failure, HTN, morbid obesity,  and iatrogenic hypothyroidism who presented after having seizure-like activity and acute encephalopathy.   Acute encephalopathy: She is back to her baseline this morning and is able to answer all of my questions and perform tasks appropriately. Her acute encephalopathy is likely due to a post-ictal state from recurrent seizures. Spoke with neurology who does not believe that she had a new stroke. She did have signs of small vessel disease which likely predisposed her to having seizures. ECHO did not show any new findings. No need for further stroke work-up including CTA or carotid dopplers at this time.  - Continue Keppra 500 mg every 12 hours - Continue home Elliquis - Seizure precautions: Per [place] DMV laws, she is not allowed to drive or use heavy machinery until she has been seizure-free for six months. Jillian Eaton expressed understanding and agreement.   Atrial fibrillation: Heart rates remain within acceptable limits. -Continue home Eliquis  Hypertension: Stable. Will restart her home  carvedilol and furosemide at discharge.   Chronic diastolic heart failure: Stable.  Continue to monitor Iatrogenic hypothyroidism: Stable.  Continue home thyroxine.  FEN/GI: Regular diet DVT prophylaxis: Home Eliquis CODE STATUS: Full code  Dispo: Anticipated discharge today.  Carroll Sage, MD 05/28/2018, 8:58 AM Pager: [phone]

## 2018-05-28 NOTE — Progress Notes (Signed)
Inpatient Rehabilitation-Admissions Coordinator   Inpatient Rehab Admissions:  Inpatient Rehab Consult received.  I met with pt at the bedside for rehabilitation assessment and to discuss goals and expectations of an inpatient rehab admission. Pt interested in CIR program and wanting to pursue an admission. However after review of case with PM&R MD, pt does not have the medical necessity to warrant an IP Rehab stay at this time.   AC has communicated with CM and will sign off.   Please call if questions.   Jhonnie Garner, OTR/L  Rehab Admissions Coordinator  [phone] 05/28/2018 2:27 PM

## 2018-05-28 NOTE — Telephone Encounter (Signed)
Returned the pt call. Pt st that she is currently admitted to [facility] RM# 69. She would like to talk with Dr. [name] directly regarding her current admission and diagnosis. Adv the pt that I will fwd the msg to Dr.Hilty.

## 2018-05-28 NOTE — Progress Notes (Signed)
Physical Therapy Treatment Patient Details Name: Jillian Eaton MRN: [id number] DOB: 10/05/1950 Today's Date: 05/28/2018    History of Present Illness Patient is a 68 y/o female who presents with AMS and seizure like activity. EEG-generalized nonspecific cerebral dysfunction. Head CT-left cerebellar infarct. Admitted with new onset seizures, postitcal. Recent admission to ED on 4/6 for flu like symptoms. PMH includes fibromyalgia, CHF, pulmonary HTN, morbid obesity, seizures, A-fib, chronic pain syndrome, acute diastolic HF.    PT Comments    Asked by MD to reevaluate pt this PM. Pt not able to progress well with mobility secondary to elevated HR ranging from 90-175 bpm in A-fib with minimal activity. Pt also with RR of 30 in standing. Continues to be confused and having a difficult time comprehending situation and understanding what is going on as she does not recall conversations or people coming into her room. Requires repetition of cues to perform tasks and has difficulty following multi step commands consistently most likely due to poor attention. Tolerated short distance ambulation with Min A for balance. Fatigues quickly requiring seated rest breaks. RN present and aware of HR in A-fib. Would really benefit from CIR due to cognitive deficits, decreased activity tolerance, cardiovascular issues and to improve overall mobility and safety as pt NOT functioning at baseline. Will follow.   Follow Up Recommendations  CIR;Supervision for mobility/OOB     Equipment Recommendations  Other (comment)(defer)    Recommendations for Other Services       Precautions / Restrictions Precautions Precautions: Fall Precaution Comments: watch HR Restrictions Weight Bearing Restrictions: No    Mobility  Bed Mobility Overal bed mobility: Needs Assistance Bed Mobility: Supine to Sit     Supine to sit: Min guard;HOB elevated     General bed mobility comments: Up in chair upon PT  arrival.  Transfers Overall transfer level: Needs assistance Equipment used: Rolling walker (2 wheeled) Transfers: Sit to/from Stand Sit to Stand: Min guard;Min assist         General transfer comment: fluctuates between min guard and min assist to ascend into standing, increased effort and forward trunk lean. Stood from Automotive engineer.  Ambulation/Gait Ambulation/Gait assistance: Min assist;+2 safety/equipment Gait Distance (Feet): 20 Feet Assistive device: Rolling walker (2 wheeled) Gait Pattern/deviations: Wide base of support;Trunk flexed;Decreased stride length Gait velocity: decreased   General Gait Details: Slow, mildly unsteady gait with RW for support; 2/4 DOE. HR ranged from 90-175 bpm. Cues for hand placement as pt leaning on front of walker. RR up to 30. BP stable    Stairs             Wheelchair Mobility    Modified Rankin (Stroke Patients Only) Modified Rankin (Stroke Patients Only) Pre-Morbid Rankin Score: Moderate disability Modified Rankin: Moderately severe disability     Balance Overall balance assessment: Needs assistance Sitting-balance support: Feet supported;No upper extremity supported Sitting balance-Leahy Scale: Fair     Standing balance support: During functional activity Standing balance-Leahy Scale: Poor Standing balance comment: Requires UE support in standing                            Cognition Arousal/Alertness: Awake/alert Behavior During Therapy: WFL for tasks assessed/performed Overall Cognitive Status: Impaired/Different from baseline Area of Impairment: Attention;Orientation;Memory;Safety/judgement;Awareness                 Orientation Level: Disoriented to;Situation Current Attention Level: Sustained Memory: Decreased short-term memory   Safety/Judgement: Decreased awareness of deficits;Decreased  awareness of safety Awareness: Intellectual Problem Solving: Requires verbal cues;Slow processing General  Comments: Pt having difficulty understanding situation and what is going on despite multiple explanations. Really poor memory recall from day to day and even within a day.       Exercises      General Comments General comments (skin integrity, edema, etc.): HR ranged from 90-175 bpm. RN present and aware.       Pertinent Vitals/Pain Pain Assessment: No/denies pain    Home Living                      Prior Function            PT Goals (current goals can now be found in the care plan section) Acute Rehab PT Goals Patient Stated Goal: to get better Progress towards PT goals: Not progressing toward goals - comment(secondary to tachycardia)    Frequency    Min 4X/week      PT Plan Current plan remains appropriate    Co-evaluation              AM-PAC PT "6 Clicks" Mobility   Outcome Measure  Help needed turning from your back to your side while in a flat bed without using bedrails?: A Little Help needed moving from lying on your back to sitting on the side of a flat bed without using bedrails?: A Little Help needed moving to and from a bed to a chair (including a wheelchair)?: A Little Help needed standing up from a chair using your arms (e.g., wheelchair or bedside chair)?: A Little Help needed to walk in hospital room?: A Little Help needed climbing 3-5 steps with a railing? : A Lot 6 Click Score: 17    End of Session Equipment Utilized During Treatment: Gait belt Activity Tolerance: Treatment limited secondary to medical complications (Comment)(A-fib, tachycardia) Patient left: in chair;with call bell/phone within reach;with chair alarm set;with nursing/sitter in room Nurse Communication: Mobility status PT Visit Diagnosis: Muscle weakness (generalized) (M62.81);Difficulty in walking, not elsewhere classified (R26.2);Unsteadiness on feet (R26.81)     Time: 7209-4709 PT Time Calculation (min) (ACUTE ONLY): 21 min  Charges:  $Gait Training: 8-22  mins                     Wray Kearns, PT, DPT Acute Rehabilitation Services Pager [phone] Office [phone]       [place] 05/28/2018, 3:39 PM

## 2018-05-28 NOTE — Progress Notes (Signed)
Patient CBG 70, patient has eaten chocolate pudding since previous CBG reading of 71.  Patient given Orange juice after the CBG reading of 70.  Patient stated that she ate nothing all day yesterday.  Patient stated that she was asleep during breakfast and lunch time but that she was awake for dinner, wasn't hungry so refused dinner tray.  RN educated patient on the importance of eating.

## 2018-05-28 NOTE — Progress Notes (Signed)
{  SLP ALL NSLP Cancellation Note  Patient Details Name: Jillian Eaton MRN: [id number] DOB: [birth year]   Cancelled treatment:       Reason Eval/Treat Not Completed: Other (comment)(pt working with PT at this time, evaluation for imminent discharge, will continue efforts)   Macario Golds 05/28/2018, 3:10 PM Luanna Salk, [place] [facility] SLP [place] Pager [phone] Office [phone]

## 2018-05-28 NOTE — Telephone Encounter (Signed)
Thanks .Marland Kitchen I can call her - the chart says she had seizure and has been confused.  Dr. Lemmie Evens

## 2018-05-28 NOTE — Progress Notes (Signed)
Physical Therapy Treatment Patient Details Name: Jillian Eaton MRN: [id number] DOB: May 27, 1950 Today's Date: 05/28/2018    History of Present Illness Patient is a 68 y/o female who presents with AMS and seizure like activity. EEG-generalized nonspecific cerebral dysfunction. Head CT-left cerebellar infarct. Admitted with new onset seizures, postitcal. Recent admission to ED on 4/6 for flu like symptoms. PMH includes fibromyalgia, CHF, pulmonary HTN, morbid obesity, seizures, A-fib, chronic pain syndrome, acute diastolic HF.    PT Comments    Patient progressing slowly towards PT goals. Continues to demonstrate confusion and has deficits related to memory, attention, orientation, and problem solving. Pt not able to state her home address. Has difficulty with dual tasking during mobility. Tolerated gait training with Min A for balance/safety. HR ranged from 90-160 bpm during session. Increased time to process and answer all questions. Pt also with poor awareness of safety and deficits. Pt manages household and medications at home. Highly recommend CIR to maximize independence and mobility so pt can return to PLOF. Will follow.   Follow Up Recommendations  CIR;Supervision for mobility/OOB     Equipment Recommendations  Other (comment)(defer)    Recommendations for Other Services       Precautions / Restrictions Precautions Precautions: Fall Precaution Comments: watch HR Restrictions Weight Bearing Restrictions: No    Mobility  Bed Mobility Overal bed mobility: Needs Assistance Bed Mobility: Supine to Sit     Supine to sit: Min guard;HOB elevated     General bed mobility comments: Cues for technique, use of rail and increased effort to get to EOB.  Transfers Overall transfer level: Needs assistance Equipment used: Rolling walker (2 wheeled) Transfers: Sit to/from Stand Sit to Stand: Min guard;Min assist         General transfer comment: Assist to power to  standing with increased time/effort. Stood from [employer], from chair x2 with forward flexed trunk, Min A at times.   Ambulation/Gait Ambulation/Gait assistance: Min assist;+2 safety/equipment Gait Distance (Feet): 40 Feet(+20') Assistive device: Rolling walker (2 wheeled) Gait Pattern/deviations: Wide base of support;Trunk flexed;Decreased stride length Gait velocity: decreased   General Gait Details: Slow, mildly unsteady gait with RW for support; 2/4 DOE. HR ranged from 90-160s bpm. 1 seated rest break. Able to count down by 3s during walking with mild instability.    Stairs             Wheelchair Mobility    Modified Rankin (Stroke Patients Only) Modified Rankin (Stroke Patients Only) Pre-Morbid Rankin Score: Moderate disability Modified Rankin: Moderately severe disability     Balance Overall balance assessment: Needs assistance Sitting-balance support: Feet supported;No upper extremity supported Sitting balance-Leahy Scale: Fair     Standing balance support: During functional activity Standing balance-Leahy Scale: Poor Standing balance comment: Requires UE support in standing and Min A for balance.                             Cognition Arousal/Alertness: Awake/alert Behavior During Therapy: WFL for tasks assessed/performed Overall Cognitive Status: Impaired/Different from baseline Area of Impairment: Orientation;Memory;Awareness;Problem solving;Safety/judgement                 Orientation Level: Disoriented to;Situation;Time   Memory: Decreased short-term memory   Safety/Judgement: Decreased awareness of deficits;Decreased awareness of safety Awareness: Intellectual Problem Solving: Requires verbal cues;Slow processing General Comments: Pt seems very confused. Trying to figure out the year by using her bday year as reference. Increased time to answer questions.  Does not know her own address. Tangential in speech, getting off topic. Poor  awareness of deficits/safety. Able to recall 3/3 words during session but does not recall therapist coming in yesterday.       Exercises      General Comments General comments (skin integrity, edema, etc.): HR ranged from 90-160s bpm.      Pertinent Vitals/Pain Pain Assessment: No/denies pain    Home Living                      Prior Function            PT Goals (current goals can now be found in the care plan section) Progress towards PT goals: Progressing toward goals    Frequency    Min 4X/week      PT Plan Current plan remains appropriate    Co-evaluation              AM-PAC PT "6 Clicks" Mobility   Outcome Measure  Help needed turning from your back to your side while in a flat bed without using bedrails?: A Little Help needed moving from lying on your back to sitting on the side of a flat bed without using bedrails?: A Little Help needed moving to and from a bed to a chair (including a wheelchair)?: A Little Help needed standing up from a chair using your arms (e.g., wheelchair or bedside chair)?: A Little Help needed to walk in hospital room?: A Little Help needed climbing 3-5 steps with a railing? : A Lot 6 Click Score: 17    End of Session Equipment Utilized During Treatment: Gait belt Activity Tolerance: Patient tolerated treatment well;Treatment limited secondary to medical complications (Comment)(tachycardia) Patient left: in chair;with call bell/phone within reach;with chair alarm set Nurse Communication: Mobility status PT Visit Diagnosis: Muscle weakness (generalized) (M62.81);Difficulty in walking, not elsewhere classified (R26.2);Unsteadiness on feet (R26.81)     Time: 5284-1324 PT Time Calculation (min) (ACUTE ONLY): 28 min  Charges:  $Gait Training: 8-22 mins $Therapeutic Activity: 8-22 mins                     Wray Kearns, [place], DPT Acute Rehabilitation Services Pager [phone] Office  [place] 05/28/2018, 11:55 AM

## 2018-05-28 NOTE — Telephone Encounter (Signed)
New Message:   Pt is a patient at [facility]. She wants to talk to Dr [name] about some concerns she have after being admitted with symptoms of a stroke and seizure.

## 2018-05-28 NOTE — Progress Notes (Signed)
VAST RN consulted to assess midline as it would not flush. Line noted to be slightly kinked beneath dressing. Dressing change completed, line without blood return, but flushing easily.

## 2018-05-28 NOTE — Progress Notes (Addendum)
Occupational Therapy Treatment Patient Details Name: Jillian Eaton [facility] MRN: [id number] DOB: Sep 13, 1950 Today's Date: 05/28/2018    History of present illness Patient is a 68 y/o female who presents with AMS and seizure like activity. EEG-generalized nonspecific cerebral dysfunction. Head CT-left cerebellar infarct. Admitted with new onset seizures, postitcal. Recent admission to ED on 4/6 for flu like symptoms. PMH includes fibromyalgia, CHF, pulmonary HTN, morbid obesity, seizures, A-fib, chronic pain syndrome, acute diastolic HF.   OT comments  Patient seated in recliner and willing to participate in OT.  Functional cognition assessed using pill box test, pt passed with 0 errors completing in 5 minutes--see below for details.   Although passed pill box test, pt continues to present with questionable cognition. Initially reporting she is at [facility], reporting incontinence of urine while sitting in recliner, and informs therapist we need to change the bed sheets because they are wet- therapist questioned, and pt corrects to need to change the sheet on the recliner.  Patient able to complete basic transfers with min assist to min guard assist given increased time, requires mod assist to bathe LB with noted HR increasing to 160s when standing during grooming tasks. Based on performance today, I highly recommend continued OT services at CIR level in order to optimize independence and safety prior to return home.  IF patient dcs home, she will need 24/7 support; with recommendation to have full supervision for IADL tasks. Will follow.     Follow Up Recommendations  CIR;Supervision/Assistance - 24 hour    Equipment Recommendations  3 in 1 bedside commode    Recommendations for Other Services      Precautions / Restrictions Precautions Precautions: Fall Precaution Comments: watch HR Restrictions Weight Bearing Restrictions: No       Mobility Bed Mobility Overal bed  mobility: Needs Assistance Bed Mobility: Supine to Sit     Supine to sit: Min guard;HOB elevated     General bed mobility comments: OOB upon entry   Transfers Overall transfer level: Needs assistance Equipment used: Rolling walker (2 wheeled) Transfers: Sit to/from Stand Sit to Stand: Min guard;Min assist         General transfer comment: fluctuates between min guard and min assist to ascend into standing     Balance Overall balance assessment: Needs assistance Sitting-balance support: Feet supported;No upper extremity supported Sitting balance-Leahy Scale: Fair     Standing balance support: During functional activity Standing balance-Leahy Scale: Poor Standing balance comment: requires at least 1 UE support during self care tasks in standing with min guard for balance within base of support                           ADL either performed or assessed with clinical judgement   ADL Overall ADL's : Needs assistance/impaired             Lower Body Bathing: Moderate assistance;Sit to/from stand Lower Body Bathing Details (indicate cue type and reason): able to complete LB hygiene after incontient urine episode with min guard in standing ; but will require assist to reach feet  Upper Body Dressing : Supervision/safety;Set up;Sitting Upper Body Dressing Details (indicate cue type and reason): min cueing to problem solve gown      Toilet Transfer: Minimal assistance;Ambulation;RW;Cueing for safety Toilet Transfer Details (indicate cue type and reason): simulated to arm chair    Toileting - Clothing Manipulation Details (indicate cue type and reason): incontient of bladder  Functional mobility during ADLs: Minimal assistance;Rolling walker;Cueing for safety General ADL Comments: pt limited by cognition, safety and balance      Vision       Perception     Praxis      Cognition Arousal/Alertness: Awake/alert Behavior During Therapy: WFL for tasks  assessed/performed Overall Cognitive Status: Impaired/Different from baseline Area of Impairment: Orientation;Memory;Awareness;Problem solving;Safety/judgement                 Orientation Level: Disoriented to;Situation   Memory: Decreased short-term memory   Safety/Judgement: Decreased awareness of deficits;Decreased awareness of safety Awareness: Intellectual Problem Solving: Requires verbal cues;Slow processing General Comments: pt oriented to month and year but not date, pt requires cueing for problem sovling and safety awareness (ie. reports she had incontinent episode, but needing to call NT to change bed sheets although she was incontient in chair and chair was soiled). passed pill box test.    Functional cognition further assessed with The Pillbox Test: A Measure of Executive Functioning and Estimate of Medication Management. A straight pass/fail designation is determined by 3 or more errors of omission or misplacement on the task. The pt completed the test without any errors.      Exercises     Shoulder Instructions       General Comments HR ranged from 100-170 during functional tasks, RN present and aware     Pertinent Vitals/ Pain       Pain Assessment: No/denies pain  Home Living                                          Prior Functioning/Environment              Frequency  Min 2X/week        Progress Toward Goals  OT Goals(current goals can now be found in the care plan section)  Progress towards OT goals: Progressing toward goals  Acute Rehab OT Goals Patient Stated Goal: to get better OT Goal Formulation: With patient Time For Goal Achievement: 06/10/18 Potential to Achieve Goals: Good  Plan Discharge plan remains appropriate;Frequency remains appropriate    Co-evaluation                 AM-PAC OT "6 Clicks" Daily Activity     Outcome Measure   Help from another person eating meals?: A Little Help from another  person taking care of personal grooming?: A Little Help from another person toileting, which includes using toliet, bedpan, or urinal?: A Lot Help from another person bathing (including washing, rinsing, drying)?: A Little Help from another person to put on and taking off regular upper body clothing?: A Little Help from another person to put on and taking off regular lower body clothing?: A Lot 6 Click Score: 16    End of Session Equipment Utilized During Treatment: Rolling walker  OT Visit Diagnosis: Muscle weakness (generalized) (M62.81);Unsteadiness on feet (R26.81);Other symptoms and signs involving cognitive function   Activity Tolerance Patient tolerated treatment well   Patient Left in chair;with call bell/phone within reach;with chair alarm set   Nurse Communication Mobility status;Other (comment)(HR)        Time: 6314-9702 OT Time Calculation (min): 52 min  Charges: OT General Charges $OT Visit: 1 Visit OT Treatments $Self Care/Home Management : 8-22 mins $Therapeutic Activity: 8-22 mins $Cognitive Funtion inital: Initial 15 mins  Delight Stare, OT Acute  Rehabilitation Services Pager [phone] Office [phone]    Delight Stare 05/28/2018, 12:44 PM

## 2018-05-28 NOTE — TOC Initial Note (Signed)
Transition of Care [facility]) - Initial/Assessment Note    Patient Details  Name: Jillian Eaton MRN: [id number] Date of Birth: 06/10/1950  Transition of Care [facility]) CM/SW Contact:    Pollie Friar, RN Phone Number: 05/28/2018, 3:37 PM  Clinical Narrative:                 CIR has declined admission. Pt states she doesn't want to go to SNF for rehab. Plan when medically ready will be home with [facility] services. CM following.  Expected Discharge Plan: IP Rehab Facility Barriers to Discharge: Continued Medical Work up   Patient Goals and CMS Choice   CMS Medicare.gov Compare Post Acute Care list provided to:: Patient Choice offered to / list presented to : Patient  Expected Discharge Plan and Services Expected Discharge Plan: [place]   Discharge Planning Services: CM Consult                              Prior Living Arrangements/Services   Lives with:: Spouse Patient language and need for interpreter reviewed:: Yes(no needs) Do you feel safe going back to the place where you live?: Yes      Need for Family Participation in Patient Care: Yes (Comment)(therapies recommending 24 hour supervision) Care giver support system in place?: Yes (comment)(spouse states he can provide 24 hour supervision) Current home services: DME(walker)    Activities of Daily Living      Permission Sought/Granted                  Emotional Assessment Appearance:: Appears stated age Attitude/Demeanor/Rapport: Engaged Affect (typically observed): Accepting, Appropriate, Pleasant Orientation: : Oriented to Self   Psych Involvement: No (comment)  Admission diagnosis:  Seizure ([place]) [R56.9] Cerebrovascular accident (CVA), unspecified mechanism ([place]) [I63.9] Patient Active Problem List   Diagnosis Date Noted  . Rotator cuff tear arthropathy of right shoulder 04/05/2018  . Overactive bladder 04/05/2018  . Insomnia 04/05/2018  . Steroid-induced hyperglycemia   . Acute  blood loss as cause of postoperative anemia   . Generalized OA   . Super obese   . 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 NICM (nonischemic cardiomyopathy) ([place]) 01/15/2018  . Osteoporosis 12/13/2017  . Chronic diastolic CHF (congestive heart failure) ([place]) 09/28/2017  . 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 Hypercapnemia 07/13/2017  . AKI (acute kidney injury) ([place]) 07/10/2017  . On anticoagulant therapy 06/19/2017  . GERD (gastroesophageal reflux disease) 08/28/2016  . 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 Depression 08/28/2016  . Iatrogenic hypocalcemia 08/28/2016  . Chronic pain syndrome   . Fibromyalgia   . Asthma   . Acute diastolic (congestive) heart failure ([place])   . Atrial fibrillation ([place]) 04/28/2016  . Vocal cord dysfunction 04/28/2016  . Thrombocytopenia ([place]) 04/28/2016  . Seizures ([place])   . Preoperative cardiovascular examination   . Leg swelling 10/19/2015  . Acute on chronic respiratory failure with hypoxia and hypercapnia ([place]) 08/03/2015  . Bilateral leg edema 05/11/2015  . Essential hypertension 05/11/2015  . Pulmonary hypertension ([place]) 01/08/2013  . Anemia of chronic disease 03/28/2011  . Morbid obesity ([place]) 09/22/2010   PCP:  [name], MD Pharmacy:   [id number] [place], [address] AT [address] [place] [address] Phone: [phone] Fax: [phone]  [facility] [place] [place] [facility] [phone]  [address] [place] [address] Phone: [phone] Fax: [place], [place] [address] [place] [address] Phone: [phone] Fax: [place] of Renata Caprice, [place] [facility] [place] [address] Phone: [phone] Fax:  [phone]  [facility] of [place], [place]. [place]. [place] [address] Phone: [phone] Fax: [phone]     Social Determinants of Health (SDOH) Interventions  Pt will use [employer] [facility] for transportation as her spouse doesn't drive.  CM encouraged spouse to oversee her medication administration after d/c d/t pts current cognitive issues.  Readmission Risk Interventions No flowsheet data found.

## 2018-05-29 ENCOUNTER — Ambulatory Visit: Payer: Medicare Other | Admitting: Internal Medicine

## 2018-05-29 LAB — BASIC METABOLIC PANEL
Anion gap: 15 (ref 5–15)
BUN: 20 mg/dL (ref 8–23)
CO2: 23 mmol/L (ref 22–32)
Calcium: 8.8 mg/dL — ABNORMAL LOW (ref 8.9–10.3)
Chloride: 105 mmol/L (ref 98–111)
Creatinine, Ser: 1.03 mg/dL — ABNORMAL HIGH (ref 0.44–1.00)
GFR calc Af Amer: >60 mL/min (ref >60)
GFR calc non Af Amer: 57 mL/min — ABNORMAL LOW (ref >60)
Glucose, Bld: 81 mg/dL (ref 70–99)
Potassium: 3.3 mmol/L — ABNORMAL LOW (ref 3.5–5.1)
Sodium: 143 mmol/L (ref 135–145)

## 2018-05-29 LAB — GLUCOSE, CAPILLARY
Glucose-Capillary: 120 mg/dL — ABNORMAL HIGH (ref 70–99)
Glucose-Capillary: 124 mg/dL — ABNORMAL HIGH (ref 70–99)
Glucose-Capillary: 68 mg/dL — ABNORMAL LOW (ref 70–99)
Glucose-Capillary: 73 mg/dL (ref 70–99)
Glucose-Capillary: 74 mg/dL (ref 70–99)
Glucose-Capillary: 77 mg/dL (ref 70–99)
Glucose-Capillary: 81 mg/dL (ref 70–99)

## 2018-05-29 MED ORDER — SODIUM CHLORIDE 0.9 % IV SOLN: INTRAVENOUS | Status: DC

## 2018-05-29 MED ORDER — LEVETIRACETAM 500 MG PO TABS
500.0000 mg | ORAL_TABLET | Freq: Two times a day (BID) | ORAL | Status: DC
  Administered 2018-05-29 – 2018-05-30 (×2): 500 mg via ORAL
  Filled 2018-05-29 (×2): qty 1

## 2018-05-29 MED ORDER — METOPROLOL TARTRATE 50 MG PO TABS
50.0000 mg | ORAL_TABLET | Freq: Two times a day (BID) | ORAL | Status: DC
  Administered 2018-05-29 – 2018-05-30 (×3): 50 mg via ORAL
  Filled 2018-05-29 (×3): qty 1

## 2018-05-29 MED ORDER — SODIUM CHLORIDE 0.9 % IV BOLUS
500.0000 mL | Freq: Once | INTRAVENOUS | Status: AC
  Administered 2018-05-29: 500 mL via INTRAVENOUS

## 2018-05-29 NOTE — Progress Notes (Signed)
   05/29/18 1446  Orthostatic Lying   BP- Lying 106/74  Pulse- Lying 74  Orthostatic Sitting  BP- Sitting 109/79  Pulse- Sitting 77  Orthostatic Standing at 0 minutes  BP- Standing at 0 minutes 115/85  Pulse- Standing at 0 minutes 94  Orthostatic Standing at 3 minutes  BP- Standing at 3 minutes (!) 136/105  Pulse- Standing at 3 minutes 99   Ambulated pt in room, HR peaked at 116, converted from normal sinus rhythm to afib. Pt currently resting in recliner chair.

## 2018-05-29 NOTE — Progress Notes (Signed)
PT Cancellation Note  Patient Details Name: Jillian Eaton MRN: [id number] DOB: 1951/02/05   Cancelled Treatment:      Pt says she is feeling less dizzy but she has not had much of an appetite. Would like to try to continue eating breakfast while it is still warm and requested PT return. Will try back as time/schedule allows.  Selinda Eon PT, DPT Acute Rehab [phone]

## 2018-05-29 NOTE — Progress Notes (Signed)
   Subjective: Ms. Santelli reports that she is feeling okay. No acute events overnight. She reports that she has some dizziness, especially when she is laying down. She also reports that she has some chest tightness.She feels like the chest tightness happens when she is having an experience, she tries to stay still to stop the dizziness. She denies any current chest tightness, dizziness, and lightheadedness. Today she is aware of why she is here and what happened in the hospital. We discussed the plan for today and she is in agreement.   Objective:  Vital signs in last 24 hours: Vitals:   05/28/18 1143 05/28/18 1913 05/28/18 2313 05/29/18 0455  BP: (!) 126/92 110/78 112/84 110/68  Pulse:      Resp: 17 (!) 21 (!) 22 18  Temp:   97.7 F (36.5 C) 98 F (36.7 C)  TempSrc:   Oral Axillary  SpO2: 97%     Weight:    124.6 kg  Height:       General: Well appearing female, NAD, sitting in bed Cardiac: Tachycardic, irregularly irregular, no m/r/g Pulm: CTAB, no wheezing or rhonchi Neuro: Awake, alert, oriented x3  Assessment/Plan:  Active Problems:   Morbid obesity ([place])   Essential hypertension   Seizures ([place])   Atrial fibrillation (H[facility])   Chronic diastolic CHF (congestive heart failure) ([place])   Ms. Qazi is a 68 year old female with history of seizures, atrial fibrillation, chronic diastolic heart failure, HTN, morbid obesity, and iatrogenic hypothyroidism who presented after having seizure-like activity and acute encephalopathy.   Seizures: Mental status continues to improve. Per neurology, she will continue Keppra indefinitely and will follow with neurology as an outpatient. PT does recommend CIR.  - ContinueKeppra500 mg every 12 hours -Continue home Elliquis - Continue PT - Seizure precautions: Per  DMV laws, she is not allowed to drive or use heavy machinery until she has been seizure-free for six months. Ms. Lindquist expressed understanding and agreement.  - Will give 500 cc  IVFs given that she has had decreased PO intake.  Atrial fibrillation:PT noted yesterday that heart rates are ranging from 90-175 with ambulation.  Review of telemetry shows that the patient was in atrial fibrillation with heart rates ranging from 110s to 150s.  Her Coreg was increased from 3.125 mg to 6.25 mg twice daily.  Heart rates have improved and remained within acceptable limits. Her blood pressure however have decreased and SBP in 90s. Will plan on changing carvedilol to metoprolol this morning and holding Lasix.  - Discontinue carvedilol 6.25 mg twice daily - Start metoprolol 50 mg BID - Continue home Eliquis  Hypertension: Stable.  Continue carvedilol and furosemide.  Chronic diastolic heart failure: Stable. Hold home Lasix in the setting of low BP.  Iatrogenic hypothyroidism:Stable. Continue home thyroxine.  FEN/GI: Regular diet DVT prophylaxis: Home Eliquis CODE STATUS: Full code  Dispo: Anticipated discharge in 0-1 days.  Carroll Sage, MD 05/29/2018, 6:46 AM Pager: [phone]

## 2018-05-29 NOTE — Progress Notes (Signed)
Pt HR in 70's NSR while at rest, but when ambulating with RN in room HR increased to 140-150's with irregular rhythm. Pt back to chair at this time, resting, with HR 100-110 irregular rhythm. Pt also hypotensive this AM, MD notified. Will continue to monitor.

## 2018-05-29 NOTE — Progress Notes (Signed)
Occupational Therapy Treatment Patient Details Name: Puanani Gene [facility] MRN: [id number] DOB: 23-Dec-1950 Today's Date: 05/29/2018    History of present illness Patient is a 68 y/o female who presents with AMS and seizure like activity. EEG-generalized nonspecific cerebral dysfunction. Head CT-left cerebellar infarct. Admitted with new onset seizures, postitcal. Recent admission to ED on 4/6 for flu like symptoms. PMH includes fibromyalgia, CHF, pulmonary HTN, morbid obesity, seizures, A-fib, chronic pain syndrome, acute diastolic HF.   OT comments  Patient seated in recliner, agreeable for OT.  Session limited by increased HR with minimal activity, increasing to 152 with standing during BP reading.  BP improved from am, seated 110/87, standing 130/99.  Able to complete grooming at sink with setup assist with minimal cueing for problem solving location of items, don/doff socks with supervision seated.  Short blessed test completed: revealing significant short term memory deficits and educated pt on safety with 24/7 assistance, assist with all IADLs at this time. Pt agreeable to recommendations. At this time recommend SNF rehab, but if pt declines (which I anticipate) she will need maximal home health services.  Will follow.    Follow Up Recommendations  Supervision/Assistance - 24 hour;SNF(if declines SNF, needs HHOT and 24/7)    Equipment Recommendations  3 in 1 bedside commode    Recommendations for Other Services      Precautions / Restrictions Precautions Precautions: Fall Precaution Comments: watch HR Restrictions Weight Bearing Restrictions: No       Mobility Bed Mobility               General bed mobility comments: OOB in chair upon arrival  Transfers Overall transfer level: Needs assistance Equipment used: Rolling walker (2 wheeled) Transfers: Sit to/from Stand Sit to Stand: Min guard         General transfer comment: min guard for safety and balance      Balance Overall balance assessment: Needs assistance Sitting-balance support: Feet supported;No upper extremity supported Sitting balance-Leahy Scale: Good     Standing balance support: During functional activity;Single extremity supported Standing balance-Leahy Scale: Poor Standing balance comment: Requires UE support in standing                           ADL either performed or assessed with clinical judgement   ADL Overall ADL's : Needs assistance/impaired     Grooming: Set up;Sitting;Wash/dry hands;Oral care Grooming Details (indicate cue type and reason): limited to sitting due to HR             Lower Body Dressing: Min guard;Sit to/from stand Lower Body Dressing Details (indicate cue type and reason): min guard for safety in standing, able to don/doff socks with supervision seated Toilet Transfer: Min Marine scientist Details (indicate cue type and reason): simulated to/from recliner          Functional mobility during ADLs: Min guard;Rolling walker General ADL Comments: presenting with increases awareness to deficits and assist needs, but continues to be limited by decreased activity tolerance, STM and safety     Vision       Perception     Praxis      Cognition Arousal/Alertness: Awake/alert Behavior During Therapy: WFL for tasks assessed/performed Overall Cognitive Status: Impaired/Different from baseline Area of Impairment: Attention;Orientation;Memory;Safety/judgement;Awareness                 Orientation Level: Disoriented to;Time(date ) Current Attention Level: Sustained Memory: Decreased short-term memory   Safety/Judgement: Decreased awareness  of deficits;Decreased awareness of safety Awareness: Emergent Problem Solving: Requires verbal cues;Slow processing General Comments: Short blessed test used to assess cognition, revealing decreased short term memory scoring 10/28 (significant impairments), currently  appears more aware of situation and deficits          Exercises     Shoulder Instructions       General Comments HR ranged from low 100s seated to max 152 in standing (minimal activity), BP seated 110/87 and standing 130/99    Pertinent Vitals/ Pain       Pain Assessment: No/denies pain  Home Living                                          Prior Functioning/Environment              Frequency  Min 2X/week        Progress Toward Goals  OT Goals(current goals can now be found in the care plan section)  Progress towards OT goals: Progressing toward goals  Acute Rehab OT Goals Patient Stated Goal: to get better OT Goal Formulation: With patient Time For Goal Achievement: 06/10/18 Potential to Achieve Goals: Good  Plan Discharge plan remains appropriate;Frequency remains appropriate    Co-evaluation                 AM-PAC OT "6 Clicks" Daily Activity     Outcome Measure   Help from another person eating meals?: None Help from another person taking care of personal grooming?: A Little Help from another person toileting, which includes using toliet, bedpan, or urinal?: A Little Help from another person bathing (including washing, rinsing, drying)?: A Little Help from another person to put on and taking off regular upper body clothing?: A Little Help from another person to put on and taking off regular lower body clothing?: A Little 6 Click Score: 19    End of Session Equipment Utilized During Treatment: Rolling walker  OT Visit Diagnosis: Muscle weakness (generalized) (M62.81);Unsteadiness on feet (R26.81);Other symptoms and signs involving cognitive function   Activity Tolerance Patient tolerated treatment well;Other (comment)(limited by HR )   Patient Left in chair;with call bell/phone within reach;with chair alarm set(with PT )   Nurse Communication Mobility status;Other (comment)(HR)        Time: 2992-4268 OT Time  Calculation (min): 28 min  Charges: OT General Charges $OT Visit: 1 Visit OT Treatments $Self Care/Home Management : 23-37 mins  Delight Stare, [place] Pager [phone] Office [phone]    Delight Stare 05/29/2018, 11:00 AM

## 2018-05-29 NOTE — TOC Progression Note (Signed)
Transition of Care [facility]) - Progression Note    Patient Details  Name: Jillian Eaton MRN: [id number] Date of Birth: 1950-04-25  Transition of Care [facility]) CM/SW Contact  Pollie Friar, RN Phone Number: 05/29/2018, 3:02 PM  Clinical Narrative:    Pt was active with [facility] prior to admission but they are refusing to see her after d/c. Pt has selected [facility] and they have accepted.  Recommendations are for 2 wheel walker and 3 in 1. Pt is refusing. She states she has walker at home and she doesn't want 3 in 1.    Expected Discharge Plan: IP Rehab Facility Barriers to Discharge: Continued Medical Work up  Expected Discharge Plan and Services Expected Discharge Plan: [place]   Discharge Planning Services: CM Consult                           [place] Agency: [facility]   Social Determinants of Health (SDOH) Interventions    Readmission Risk Interventions Readmission Risk Prevention Plan 05/28/2018  Transportation Screening Complete  HRI or Home Care Consult Complete  Social Work Consult for [place] Planning/Counseling Not Complete  SW consult not completed comments no need  Palliative Care Screening Not Applicable  Some recent data might be hidden

## 2018-05-29 NOTE — Progress Notes (Signed)
Physical Therapy Treatment Patient Details Name: Jillian Eaton MRN: [id number] DOB: 08-May-1950 Today's Date: 05/29/2018    History of Present Illness Patient is a 68 y/o female who presents with AMS and seizure like activity. EEG-generalized nonspecific cerebral dysfunction. Head CT-left cerebellar infarct. Admitted with new onset seizures, postitcal. Recent admission to ED on 4/6 for flu like symptoms. PMH includes fibromyalgia, CHF, pulmonary HTN, morbid obesity, seizures, A-fib, chronic pain syndrome, acute diastolic HF.    PT Comments    Pt found in chair working with OT and pt agreeable to ambulate with monitoring of vitals. Ambulated approx 15 feet and HR spiked to 161. Pt reported some dizziness with feeling that her heart was beating out of her chest. Rolled in chair back to room. HR returned to 110 after approx 5 min being seated and BP to 107/71.    Follow Up Recommendations  CIR;Supervision for mobility/OOB     Equipment Recommendations  Standard walker(2 wheel)    Recommendations for Other Services       Precautions / Restrictions Precautions Precautions: Fall Precaution Comments: watch HR Restrictions Weight Bearing Restrictions: No    Mobility  Bed Mobility               General bed mobility comments: OOB in chair upon arrival  Transfers Overall transfer level: Needs assistance Equipment used: Rolling walker (2 wheeled) Transfers: Sit to/from Stand Sit to Stand: Min guard;Modified independent (Device/Increase time)         General transfer comment: min guard for safety- pt bends forward below RW to stand  Ambulation/Gait Ambulation/Gait assistance: Min assist;+2 safety/equipment Gait Distance (Feet): 15 Feet Assistive device: Rolling walker (2 wheeled) Gait Pattern/deviations: Step-through pattern;Decreased dorsiflexion - right;Decreased dorsiflexion - left;Trunk flexed;Wide base of support Gait velocity: decreased   General Gait  Details: slow steps, stays in walker, chair follow behind. HR up to 161   Stairs             Wheelchair Mobility    Modified Rankin (Stroke Patients Only)       Balance Overall balance assessment: Needs assistance Sitting-balance support: Feet supported;No upper extremity supported Sitting balance-Leahy Scale: Good     Standing balance support: During functional activity;Single extremity supported Standing balance-Leahy Scale: Poor Standing balance comment: Requires UE support in standing & during gait                            Cognition Arousal/Alertness: Awake/alert Behavior During Therapy: WFL for tasks assessed/performed Overall Cognitive Status: Impaired/Different from baseline Area of Impairment: Attention;Safety/judgement;Awareness                 Orientation Level: Disoriented to;Time(date ) Current Attention Level: Sustained Memory: Decreased short-term memory   Safety/Judgement: Decreased awareness of deficits;Decreased awareness of safety Awareness: Emergent Problem Solving: Requires verbal cues;Slow processing General Comments: Did recognize that her HR was increasing today      Exercises      General Comments General comments (skin integrity, edema, etc.): HR ranged from low 100s seated to max 152 in standing (minimal activity), BP seated 110/87 and standing 130/99      Pertinent Vitals/Pain Pain Assessment: Faces Faces Pain Scale: Hurts a little bit Pain Location: knees Pain Descriptors / Indicators: Sore Pain Intervention(s): Limited activity within patient's tolerance    Home Living                      Prior Function  PT Goals (current goals can now be found in the care plan section) Acute Rehab PT Goals Patient Stated Goal: to get better    Frequency    Min 4X/week      PT Plan Current plan remains appropriate    Co-evaluation              AM-PAC PT "6 Clicks" Mobility    Outcome Measure  Help needed turning from your back to your side while in a flat bed without using bedrails?: None Help needed moving from lying on your back to sitting on the side of a flat bed without using bedrails?: A Little Help needed moving to and from a bed to a chair (including a wheelchair)?: A Little Help needed standing up from a chair using your arms (e.g., wheelchair or bedside chair)?: A Little Help needed to walk in hospital room?: A Little Help needed climbing 3-5 steps with a railing? : A Lot 6 Click Score: 18    End of Session Equipment Utilized During Treatment: Gait belt Activity Tolerance: Treatment limited secondary to medical complications (Comment)(tachy) Patient left: in chair;with chair alarm set;with call bell/phone within reach   PT Visit Diagnosis: Muscle weakness (generalized) (M62.81);Difficulty in walking, not elsewhere classified (R26.2);Unsteadiness on feet (R26.81) Pain - Right/Left: (bil) Pain - part of body: Knee     Time: 6389-3734 PT Time Calculation (min) (ACUTE ONLY): 17 min  Charges:  $Gait Training: 8-22 mins                     Selinda Eon PT, DPT Acute Rehab [phone]

## 2018-05-29 NOTE — Telephone Encounter (Signed)
Per pt call just following up on a call back from Dr [name].  I assured pt she will be getting a call from him soon.   Pt said thank you.

## 2018-05-29 NOTE — Progress Notes (Signed)
  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  Pt states voice has returned to baseline and she denies dysphagia.  Does admit currently to having some issues with sensing "congestion" in pharynx/proximal trachea - but states she is having some post nasal drip.  Pt's dysphagia due to mentation issues has resolved- recommend advance to normal diet and no follow up indicated.        HPI HPI: pt is a 68 yo female adm to [facility] with seizure.  Pt found to have left acute nonhemorrhagic cva.  cxr showed stable cardiomegaly. Pt PMH + for Afib, obesity, SOB, CHF, goiter, thyroid disease, FM, heart murmur, asthma, anemia.  Pt bit tongue with seizure and also had a seizure in ED.        SLP Plan  All goals met    Regular/thin diet recommended    Recommendations  Medication Administration: Whole meds with liquid Supervision: Patient able to self feed Compensations: Slow rate;Small sips/bites Postural Changes and/or Swallow Maneuvers: Seated upright 90 degrees;Upright 30-60 min after meal                Oral Care Recommendations: Oral care QID Follow up Recommendations: None SLP Visit Diagnosis: Dysphagia, unspecified (R13.10) Plan: All goals met       GO                Jillian Eaton 05/29/2018, 2:31 PM  Jillian Eaton, [facility] SLP Acute Rehab Services Pager [phone] Office [phone]

## 2018-05-30 DIAGNOSIS — Z885 Allergy status to narcotic agent status: Secondary | ICD-10-CM

## 2018-05-30 DIAGNOSIS — Z6841 Body Mass Index (BMI) 40.0 and over, adult: Secondary | ICD-10-CM

## 2018-05-30 DIAGNOSIS — G40909 Epilepsy, unspecified, not intractable, without status epilepticus: Secondary | ICD-10-CM

## 2018-05-30 DIAGNOSIS — I480 Paroxysmal atrial fibrillation: Secondary | ICD-10-CM

## 2018-05-30 LAB — CULTURE, BLOOD (ROUTINE X 2): Culture: NO GROWTH

## 2018-05-30 LAB — GLUCOSE, CAPILLARY
Glucose-Capillary: 90 mg/dL (ref 70–99)
Glucose-Capillary: 85 mg/dL (ref 70–99)
Glucose-Capillary: 73 mg/dL (ref 70–99)
Glucose-Capillary: 81 mg/dL (ref 70–99)

## 2018-05-30 MED ORDER — METOPROLOL SUCCINATE ER 50 MG PO TB24: 100.0000 mg | ORAL_TABLET | Freq: Every day | ORAL | 0 refills | Status: DC

## 2018-05-30 MED ORDER — LEVETIRACETAM 500 MG PO TABS: 500.0000 mg | ORAL_TABLET | Freq: Two times a day (BID) | ORAL | 0 refills | Status: DC

## 2018-05-30 MED ORDER — SODIUM CHLORIDE 0.9 % IV BOLUS
500.0000 mL | Freq: Once | INTRAVENOUS | Status: AC
  Administered 2018-05-30: 500 mL via INTRAVENOUS

## 2018-05-30 MED ORDER — POTASSIUM CHLORIDE CRYS ER 20 MEQ PO TBCR
40.0000 meq | EXTENDED_RELEASE_TABLET | Freq: Once | ORAL | Status: AC
  Administered 2018-05-30: 40 meq via ORAL
  Filled 2018-05-30: qty 2

## 2018-05-30 NOTE — Telephone Encounter (Signed)
Appointment scheduled for 4/14 at 2 pm.

## 2018-05-30 NOTE — Telephone Encounter (Signed)
° °  Dr Alfonse Spruce from [facility] calling to request "in office" appointment for patient next week.  Dr Alfonse Spruce states she has changed several medications and taken patient off Lasix.   Please advise

## 2018-05-30 NOTE — Plan of Care (Signed)
  Problem: Education: Goal: Expressions of having a comfortable level of knowledge regarding the disease process will increase Outcome: Progressing   Problem: Coping: Goal: Ability to adjust to condition or change in health will improve Outcome: Progressing Goal: Ability to identify appropriate support needs will improve Outcome: Progressing   Problem: Medication: Goal: Risk for medication side effects will decrease Outcome: Progressing   Problem: Safety: Goal: Verbalization of understanding the information provided will improve Outcome: Progressing  Ival Bible, BSN, RN

## 2018-05-30 NOTE — Progress Notes (Signed)
Physical Therapy Treatment Patient Details Name: Jillian Eaton MRN: [id number] DOB: 12-13-1950 Today's Date: 05/30/2018    History of Present Illness Patient is a 68 y/o female who presents with AMS and seizure like activity. EEG-generalized nonspecific cerebral dysfunction. Head CT-left cerebellar infarct. Admitted with new onset seizures, postitcal. Recent admission to ED on 4/6 for flu like symptoms. PMH includes fibromyalgia, CHF, pulmonary HTN, morbid obesity, seizures, A-fib, chronic pain syndrome, acute diastolic HF.    PT Comments    Pt performed gait training HR remains to elevate to 137 bpm.  Mild dizziness noted but BP WNL.  Plan for HHPT is appropriate based on progression.     Follow Up Recommendations  CIR;Supervision for mobility/OOB     Equipment Recommendations  Rolling walker with 5" wheels(2 Wheel)    Recommendations for Other Services Rehab consult     Precautions / Restrictions Precautions Precautions: Fall Precaution Comments: watch HR Restrictions Weight Bearing Restrictions: No    Mobility  Bed Mobility               General bed mobility comments: OOB in chair upon arrival  Transfers Overall transfer level: Needs assistance Equipment used: Rolling walker (2 wheeled) Transfers: Sit to/from Stand Sit to Stand: Min guard Stand pivot transfers: Min guard       General transfer comment: Pt has decreased ROM in B knees, flexs forward at hips to achieve standing.  No physical assistance to achieve standing.    Ambulation/Gait Ambulation/Gait assistance: Min assist;+2 safety/equipment Gait Distance (Feet): 20 Feet Assistive device: Rolling walker (2 wheeled) Gait Pattern/deviations: Step-through pattern;Decreased dorsiflexion - right;Decreased dorsiflexion - left;Trunk flexed;Wide base of support Gait velocity: decreased   General Gait Details: Pt remains to present with slow waddling gait, HR elevated to 137bpm.     Stairs              Wheelchair Mobility    Modified Rankin (Stroke Patients Only) Modified Rankin (Stroke Patients Only) Pre-Morbid Rankin Score: Moderate disability Modified Rankin: Moderately severe disability     Balance Overall balance assessment: Needs assistance   Sitting balance-Leahy Scale: Good       Standing balance-Leahy Scale: Poor                              Cognition Arousal/Alertness: Awake/alert Behavior During Therapy: WFL for tasks assessed/performed Overall Cognitive Status: Within Functional Limits for tasks assessed                                        Exercises      General Comments        Pertinent Vitals/Pain Pain Assessment: Faces Faces Pain Scale: Hurts even more Pain Location: knees Pain Descriptors / Indicators: Sore Pain Intervention(s): Monitored during session;Repositioned    Home Living                      Prior Function            PT Goals (current goals can now be found in the care plan section) Acute Rehab PT Goals Patient Stated Goal: to get better Potential to Achieve Goals: Good Progress towards PT goals: Progressing toward goals    Frequency    Min 4X/week      PT Plan Current plan remains appropriate    Co-evaluation  AM-PAC PT "6 Clicks" Mobility   Outcome Measure  Help needed turning from your back to your side while in a flat bed without using bedrails?: None Help needed moving from lying on your back to sitting on the side of a flat bed without using bedrails?: A Little Help needed moving to and from a bed to a chair (including a wheelchair)?: A Little Help needed standing up from a chair using your arms (e.g., wheelchair or bedside chair)?: A Little Help needed to walk in hospital room?: A Little Help needed climbing 3-5 steps with a railing? : A Lot 6 Click Score: 18    End of Session Equipment Utilized During Treatment: Gait belt Activity  Tolerance: Treatment limited secondary to medical complications (Comment)(remains to present with elevation of HR with increased activity, however less elevated then previous session.  ) Patient left: in chair;with chair alarm set;with call bell/phone within reach Nurse Communication: Mobility status PT Visit Diagnosis: Muscle weakness (generalized) (M62.81);Difficulty in walking, not elsewhere classified (R26.2);Unsteadiness on feet (R26.81) Pain - Right/Left: (Bil) Pain - part of body: Knee     Time: 1308-6578 PT Time Calculation (min) (ACUTE ONLY): 27 min  Charges:  $Gait Training: 8-22 mins $Therapeutic Activity: 8-22 mins                     Joycelyn Rua, PTA Acute Rehabilitation Services Pager [phone] Office [phone]     Jillian Eaton 05/30/2018, 4:21 PM

## 2018-05-30 NOTE — Telephone Encounter (Signed)
Spoke with patient via telephone today -ok to schedule in-office visit with me next Tuesday, 4/14 which is my DOD day at [employer].  Dr. [name]

## 2018-05-30 NOTE — TOC Transition Note (Signed)
Transition of Care [facility]) - CM/SW Discharge Note   Patient Details  Name: Jillian Eaton MRN: [id number] Date of Birth: 07-10-1950  [facility] of [facility] CM/SW Contact:  Pollie Friar, RN Phone Number: 05/30/2018, 11:28 AM   Clinical Narrative:    Pt discharging home with [facility] services. Pt has transportation home. She is refusing ordered 3 in 1.   Final next level of care: [place] Barriers to Discharge: No Barriers Identified   Patient Goals and CMS Choice Patient states their goals for this hospitalization and ongoing recovery are:: to get home to her dog CMS Medicare.gov Compare Post Acute Care list provided to:: Patient Choice offered to / list presented to : Patient  Discharge Placement                       Discharge Plan and Services   Discharge Planning Services: CM Consult Post Acute Care Choice: Home Health              HH Arranged: RN, PT, OT, Nurse's Aide [facility] Agency: [facility]--[facility] with [facility] the [facility].   Social Determinants of Health (SDOH) Interventions     Readmission Risk Interventions Readmission Risk Prevention Plan 05/30/2018 05/28/2018  Transportation Screening - Complete  PCP or Specialist Appt within 3-5 Days Complete -  HRI or [place] - Complete  Social Work Consult for [place] Planning/Counseling - Not Complete  SW consult not completed comments - no need  Palliative Care Screening - Not Applicable  Medication Review (RN Transport planner) Referral to Pharmacy -  Some recent data might be hidden

## 2018-05-30 NOTE — Progress Notes (Signed)
Pt asked about ambulating at this time, pt stated that she was too sleepy.

## 2018-05-30 NOTE — Progress Notes (Signed)
   Subjective: Ms. Nott states that she is doing well this morning.  She reports that her dizziness and palpitations have improved with IV fluids than yesterday.  She denies any chest tightness, nausea, vomiting, and abdominal pain.  We spoke about discharge to SNF versus home with home health.  She would like to go home and believes that she will continue to regain her memory and strength.  Objective:  Vital signs in last 24 hours: Vitals:   05/30/18 0319 05/30/18 0420 05/30/18 0743 05/30/18 0910  BP: 119/80  (!) 88/60 95/73  Pulse: 72  70   Resp:   19 18  Temp: 98.2 F (36.8 C)  98.1 F (36.7 C)   TempSrc: Oral  Oral   SpO2: 100%  97%   Weight:  125.3 kg    Height:       General: Lying in bed in no acute distress Cardiovascular: Normal rate, irregularly irregular rhythm Respiratory: Clear to auscultation bilaterally normal work of breathing MSK: No lower extremity edema, erythema, or warmth bilaterally.  Assessment/Plan:  Active Problems:   Morbid obesity ([place])   Essential hypertension   Seizures ([place])   Atrial fibrillation ([facility])   Chronic diastolic CHF (congestive heart failure) ([place])  Ms. Halley is a 68 year old female with a history of seizures, chronic diastolic heart failure, atrial fibrillation, and morbid obesity who presents after having a seizure.  Seizures: Mental status remains at her baseline. She is stable for discharge with the following medications/recommendations: - Keppra 500 mg BID - Elliquis 5 mg BID - Home OT/PT/Home health  - Follow up with neurology as an outpatient.   Atrial fibrillation: Heart rate improved to 70's-80's with the switch from carvedilol to metoprolol. She will need to continue this medication as an outpatient and will follow-up with cardiology. - Switch metoprolol tartrate to succinate and discharge on metoprolol succinate 100 mg QD - Continue home Elliquis per above  Hypertension: BP has remained soft. Will plan on giving  another 500cc bolus of IVF and continue holding Lasix. - 500 cc IVF  Chronic diastolic heart failure: Continue holding home Lasix in the setting of low BP.  Iatrogenic hypothyroidism: Continue home levothyroxine.  Dispo: Anticipated discharge today.   Carroll Sage, MD 05/30/2018, 10:03 AM Pager: [phone]

## 2018-05-31 LAB — CULTURE, BLOOD (ROUTINE X 2)
Culture: NO GROWTH
Culture: NO GROWTH

## 2018-06-01 DIAGNOSIS — I480 Paroxysmal atrial fibrillation: Secondary | ICD-10-CM | POA: Diagnosis not present

## 2018-06-01 DIAGNOSIS — J45909 Unspecified asthma, uncomplicated: Secondary | ICD-10-CM | POA: Diagnosis not present

## 2018-06-01 DIAGNOSIS — Z9181 History of falling: Secondary | ICD-10-CM | POA: Diagnosis not present

## 2018-06-01 DIAGNOSIS — E039 Hypothyroidism, unspecified: Secondary | ICD-10-CM | POA: Diagnosis not present

## 2018-06-01 DIAGNOSIS — M1990 Unspecified osteoarthritis, unspecified site: Secondary | ICD-10-CM | POA: Diagnosis not present

## 2018-06-01 DIAGNOSIS — Z7901 Long term (current) use of anticoagulants: Secondary | ICD-10-CM | POA: Diagnosis not present

## 2018-06-01 DIAGNOSIS — M810 Age-related osteoporosis without current pathological fracture: Secondary | ICD-10-CM | POA: Diagnosis not present

## 2018-06-01 DIAGNOSIS — F329 Major depressive disorder, single episode, unspecified: Secondary | ICD-10-CM | POA: Diagnosis not present

## 2018-06-01 DIAGNOSIS — G40909 Epilepsy, unspecified, not intractable, without status epilepticus: Secondary | ICD-10-CM | POA: Diagnosis not present

## 2018-06-01 DIAGNOSIS — G4700 Insomnia, unspecified: Secondary | ICD-10-CM | POA: Diagnosis not present

## 2018-06-01 DIAGNOSIS — M797 Fibromyalgia: Secondary | ICD-10-CM | POA: Diagnosis not present

## 2018-06-01 DIAGNOSIS — G894 Chronic pain syndrome: Secondary | ICD-10-CM | POA: Diagnosis not present

## 2018-06-01 DIAGNOSIS — D649 Anemia, unspecified: Secondary | ICD-10-CM | POA: Diagnosis not present

## 2018-06-01 DIAGNOSIS — I5040 Unspecified combined systolic (congestive) and diastolic (congestive) heart failure: Secondary | ICD-10-CM | POA: Diagnosis not present

## 2018-06-01 DIAGNOSIS — K219 Gastro-esophageal reflux disease without esophagitis: Secondary | ICD-10-CM | POA: Diagnosis not present

## 2018-06-01 DIAGNOSIS — J9610 Chronic respiratory failure, unspecified whether with hypoxia or hypercapnia: Secondary | ICD-10-CM | POA: Diagnosis not present

## 2018-06-01 DIAGNOSIS — I110 Hypertensive heart disease with heart failure: Secondary | ICD-10-CM | POA: Diagnosis not present

## 2018-06-03 ENCOUNTER — Telehealth: Payer: Self-pay | Admitting: Internal Medicine

## 2018-06-03 DIAGNOSIS — I10 Essential (primary) hypertension: Secondary | ICD-10-CM | POA: Diagnosis not present

## 2018-06-03 DIAGNOSIS — G40909 Epilepsy, unspecified, not intractable, without status epilepticus: Secondary | ICD-10-CM | POA: Diagnosis not present

## 2018-06-03 DIAGNOSIS — I480 Paroxysmal atrial fibrillation: Secondary | ICD-10-CM | POA: Diagnosis not present

## 2018-06-03 DIAGNOSIS — Z758 Other problems related to medical facilities and other health care: Secondary | ICD-10-CM | POA: Diagnosis not present

## 2018-06-03 DIAGNOSIS — I5040 Unspecified combined systolic (congestive) and diastolic (congestive) heart failure: Secondary | ICD-10-CM | POA: Diagnosis not present

## 2018-06-03 DIAGNOSIS — J45909 Unspecified asthma, uncomplicated: Secondary | ICD-10-CM | POA: Diagnosis not present

## 2018-06-03 DIAGNOSIS — J9610 Chronic respiratory failure, unspecified whether with hypoxia or hypercapnia: Secondary | ICD-10-CM | POA: Diagnosis not present

## 2018-06-03 DIAGNOSIS — I110 Hypertensive heart disease with heart failure: Secondary | ICD-10-CM | POA: Diagnosis not present

## 2018-06-03 NOTE — Telephone Encounter (Signed)
Smart phone/MyChart/pre reg complete. 06-03-18 ST The patient consents to her visit. 06-03-18 ST

## 2018-06-04 ENCOUNTER — Other Ambulatory Visit: Payer: Self-pay

## 2018-06-04 ENCOUNTER — Encounter: Payer: Self-pay | Admitting: Internal Medicine

## 2018-06-04 ENCOUNTER — Telehealth: Payer: Self-pay

## 2018-06-04 ENCOUNTER — Ambulatory Visit (INDEPENDENT_AMBULATORY_CARE_PROVIDER_SITE_OTHER): Payer: Medicare Other | Admitting: Internal Medicine

## 2018-06-04 VITALS — HR 90 | Resp 12 | Ht 65.0 in

## 2018-06-04 DIAGNOSIS — I639 Cerebral infarction, unspecified: Secondary | ICD-10-CM

## 2018-06-04 DIAGNOSIS — I951 Orthostatic hypotension: Secondary | ICD-10-CM | POA: Diagnosis not present

## 2018-06-04 DIAGNOSIS — G40909 Epilepsy, unspecified, not intractable, without status epilepticus: Secondary | ICD-10-CM | POA: Diagnosis not present

## 2018-06-04 DIAGNOSIS — I5032 Chronic diastolic (congestive) heart failure: Secondary | ICD-10-CM

## 2018-06-04 DIAGNOSIS — I4819 Other persistent atrial fibrillation: Secondary | ICD-10-CM | POA: Diagnosis not present

## 2018-06-04 MED ORDER — FUROSEMIDE 20 MG PO TABS: 20.0000 mg | ORAL_TABLET | Freq: Every day | ORAL | 11 refills | Status: DC | PRN

## 2018-06-04 MED ORDER — METOPROLOL SUCCINATE ER 25 MG PO TB24: ORAL_TABLET | ORAL | 3 refills | Status: DC

## 2018-06-04 NOTE — Progress Notes (Signed)
OFFICE NOTE  Chief Complaint:  Follow-up hospitalization  Primary Care Physician: [name], MD  HPI:  Jillian Eaton is a 68 year old female I saw a few weeks ago with a history of super morbid obesity, fibromyalgia and increasing shortness of breath. She had a cath in 2012 which showed normal coronaries, mildly elevating filling pressures, and diastolic pressures with a mean PA of 31. This correlated with her echocardiogram when RV systolic pressures in 9417 were 49 on echo. A repeat echocardiogram was just performed which demonstrated a preserved LVEF of 60-65% with moderate concentric LVH. There was mild to moderate increase in pulmonary artery pressure with peak at 51, which has not significantly changed from her study 1 year ago. Although the pressures look very similar her shortness of breath has been increasing significantly, and I recommended that she wear oxygen at night since she had that at home which does seem to be helping her at least feel better during the day as I suspect she has sleep apnea. She has been tested before which was apparently negative and is considering retesting at some point in the future. Overall I think the main issue obviously is weight, and she unfortunately says that she is not a candidate for gastric bypass and therefore it is a very difficult situation.  I referred her back to her pulmonologist in [facility] for ongoing evaluation of pulmonary hypertension. She tells me that she was then referred to [facility] and saw a specialist there who did another right heart catheterization but did not recommend any medications other than better blood pressure control.   In addition she had problems with kidney stones and underwent 2 operations regarding tthis.  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  She is now dependent on thyroid medication. Unfortunately she's not been able to lose weight, but her weight is  fairly stable around 400 pounds as is her shortness of breath.  Cath in 2012:  LEFT HEART CATHETERIZATION  OPERATOR: [place] Andreah Goheen, MD, and Rolland Porter, MD  INDICATION: Dyspnea on exertion and chest pressure.  HISTORY OF PRESENT ILLNESS: Jillian Eaton is a morbidly obese (BMI greater than 48) female with a history of failed gastric bypass x2 with an increasing weight gain and increasing shortness of breath as well as new- onset dyspnea on exertion. She reports that she can only walk about 10 feet before she becomes short of breath and has chest pressure which she says get better with rest. She has numerous cardiac risk factors and given the high likelihood of false positive stress test, I have referred her for cardiac catheterization, both left and right heart as she has had elevated RVSP on echocardiogram of approximately 30 to 31 mmHg.  PROCEDURE: The patient was brought into the cardiac catheterization lab, sterilely prepped and draped in the usual fashion. The area around the right femoral artery and right brachial vein were cleansed and draped to allow an attempt at radial arterial and brachiocephalic venous access. IV was not able to be obtained prior to the procedure given her body habitus. With difficulty in assessing the vein, the ultrasound was eventually used to identify the right brachiocephalic vein, however, cannulation with needle and wire was not possible as the vein was very small in caliber. After mild local bleeding was controlled, we did turn our attention to the right femoral vein and with great difficulty using the ultrasound, the right femoral vein was accessed by Dr.  Ellyn Hack using a straight wire and a needle. After venous access was obtained, the attention was turned to the right radial artery by Dr. Ellyn Hack and simultaneous to that right heart catheterization was performed by myself without any difficulty. The right radial artery was  successfully cannulated and subsequently left coronary artery system was selectively injected with a 5-French TIG 4.0 catheter, however the right coronary artery could not be cannulated with the TIG catheter and was eventually cannulated with a JR-4 catheter. LV pressure was measured with a pigtail catheter. Estimated blood loss was about 30 mL. There were no acute complications. The patient received a total of 9 mg of Versed throughout the procedure as well as 225 mcg of fentanyl and was at no point greater than moderately sedated. She received 5000 units of heparin about 10 mL of a radial cocktail.  FINDINGS: 1. Left main - short, no disease. 2. LAD - no significant disease. 3. Left circumflex, no significant disease. 4. RCA - dominant, no disease, large-caliber vessel. 5. LVEDP = 20 mmHg. 6. RA - 12. 7. RV 38/12. 8. PA - 43/19 (31). 9. PCWP - 24. 10.TPG - 7. 11.Fick cardiac output/Fick cardiac index - 10.56/3.84. 12.Thermodilution cardiac output/thermodilution cardiac index -  6.78/2.47. 13.Aortic saturation - 94%. 14.PA saturation - 68%.  IMPRESSION: 1. No significant obstructive coronary artery disease. 2. LVEDP = 20 mmHg. 3. Borderline pulmonary venous hypertension. 4. High cardiac output.  Jillian Eaton returns today for follow-up. She reports that her shortness of breath has not significantly worsened, in fact, possibly is slightly better. She did have thyroid surgery last year at [facility] and apparently underwent right heart catheterization prior to that. I do not have those records immediately available. Unfortunately she continues to maintain her weight and has not been able to lose any. She is complaining of some numbness and tingling in her feet which is likely neuropathy. She is on medication including Lyrica which she takes for fibromyalgia.  Jillian Eaton returns today for follow-up. She recently is been having more shortness of breath  and lower extremity swelling. She pointed out edema in her legs with very small blisters and some chronic venous stasis changes. She is not currently on a diuretic. She recently saw another new primary care provider with the [facility]. She was noted to be started on lisinopril 40 mg daily, which she is taking in addition to losartan 100 mg daily. The notes do not indicate from her office visit why she was started on lisinopril, but I can see through care everywhere that this was ordered by her primary care provider. She also takes amlodipine for blood pressure control. Her blood pressure was elevated initially at 178/94, but after resting came down to 120/78 and is at goal today. It is unusual, however to use both ACE inhibitors and ARBs.  07/02/2015  Mrs. Carrigan returns today for follow-up. She underwent a repeat echocardiogram for progressive dyspnea and exertion which showed a preserved LVEF of 6065% however there is moderate to severe pulmonary hypertension with an RVSP of 64 mmHg. This is increased about 10 mmHg compared to her prior study. Her mean pulmonary pressure by catheterization in 2012 was only 31 mmHg. I suspect that progressive pulmonary hypertension as a cause of her worsening shortness of breath. In fact, during recent hospitalization for surgery she required discharged with oxygen. She says she rarely uses oxygen at home but notes that she is hypoxic often when she checks her oxygen  levels. At her last office visit I recommended Lasix which she has been using sparingly. She has problems with incontinence and she does report an improvement in her swelling with it but does not notice significant change in her shortness of breath.  06/06/2016  Mrs. Hassan returns from hospital follow-up. She was just admitted after an admission in January for unintentional narcotic overdose. She had unresponsiveness and possible aspiration. There was a second admission for a similar presentation  with respiratory failure, fever, sepsis and ultimately she was found to have a new onset cardiomyopathy with EF as low as 25%. Was felt that this was nonischemic. She was having intermittent atrial tachycardia and was felt that this might need to be managed with antiarrhythmic therapy however it seems to have resolved with carvedilol. She was placed on diuretics and reports that her breathing is close to baseline. She remains hypoxic with an O2 saturation 93% however was on home oxygen is result of her severe pulmonary hypertension. She does not feel that she needs to use the oxygen very regularly. From a heart failure standpoint she is on carvedilol, aspirin, furosemide and not currently on an ACE inhibitor, ARB or Entresto. Recent testing a renal function shows normal creatinine.  10/11/2016  Mrs. Suttles is seen today in follow-up. In July she was hospitalized for hyperkalemia. She is on supplemental potassium and lisinopril, both were stopped. Fortunately her echo has improved back to normal recently. Overall she feels well. Her weight is down about 40 pounds of the recently she gained about 20 pounds back. She is working on that right as we speak.  04/03/2017  Mrs. Brott was seen today in follow-up.  She recently called in and had complaints of worsening shortness of breath.  She saw her pulmonary doctor who did an x-ray noted a mild right upper lobe infiltrate versus edema.  Labs were obtained including BNP which was only mildly elevated at 110.  She was given 20 mg of Lasix with no benefit and then recently was advised over the phone to increase her Lasix to 40 mg for 2 days.  She urinated quite a bit and lost about 5 pounds of what she feels was water weight.  She reports mild improvement in her breathing.  She is quite anxious about what might have led to this fluid gain.  Given her history of cardiomyopathy, it is possible she could have had some recurrent cardiomyopathy.  She reports stable diet and  no worsening abuse of sodium.  06/19/2017  Mrs. Pruiett returns today for follow-up.  She was supposed to have a limited echocardiogram but that never happened.  Approximately 2 to 3 weeks ago she called the office reporting palpitations.  We try to schedule an earlier appointment, but she ended up going to [facility] for her [facility].  On the flight she apparently became short of breath had some left flank pain and was hypoxic.  She was given oxygen and after landing they took her to the [facility] in [place].  She was treated there and found to be in A. fib with RVR.  This is a new finding.  She had a remote history of either PAT or PAF, but so brief that she was not anticoagulated.  She was then started on Xarelto.  She tells me she was given Lasix and diuresed.  She returns today for follow-up and still reports some shortness of breath.  Weight is about 6 pounds heavier than she was 2 months ago.  She is in A. fib persistently with heart rates in the low 100s.  In addition she reports after starting the Xarelto that she has been having some vaginal bleeding.  07/26/2017  Mrs. Keasling returns today for follow-up.  She was recently seen in the hospital and discharged on 07/17/2017.  Today is a transition of care follow-up.  She was contacted on the day after discharge and felt to be doing well.  She is currently in rehabilitation.  She says she has had marked improvement in her shortness of breath.  She feels like her edema has improved.  She has decreased her dose of daily Lasix from 80 mg to 40 mg.  She is in persistent atrial fibrillation however rate controlled on amiodarone 400 mg which she is taking twice daily.  She reports she is getting stronger and hopefully will be out of rehabilitation within the next week.  She did miss 1 dose of Eliquis due to a transfer issue on May 28 and therefore will need 3 weeks of subsequent uninterrupted anticoagulation prior to attempted  cardioversion.  09/13/2017  Mrs. Revolorio returns today for follow-up of cardioversion.  Unfortunately this was unsuccessful after 3 shocks.  She told me there is some confusion afterwards that she forgot to take her amiodarone.  She was taking it up until the cardioversion.  Since it was unsuccessful, I recommended rate control strategy.  At this point there is little benefit from being on amiodarone and the side effect profile would not make it favorable.  I would recommend just continuing carvedilol for rate control.  She is had significant weight loss now down to 313 from 340.  This will continue to help her symptoms.  Her shortness of breath has improved.  She has had no significant worsening edema.  No further significant GU bleeding.  09/28/2017  Mrs. Wyne returns today for follow-up of multiple ER visits.  I last saw her a couple weeks ago and unfortunately she was maintaining A. fib after unsuccessful cardioversion.  She continues to lose weight.  In fact she is down to 306 pounds today from 313.  Lab work indicates a rising creatinine and very low BNP, which makes me feel that she may be over diuresed.  She is also noted to be anemic with iron deficiency and is anticoagulated on Eliquis.  Her PCP is likely to start her on iron.  She denies any blood in the stool however stool guaiacs or work-up for microscopic iron is indicated.  She reports intermittent hematuria which is been minor.  Blood pressure is noted to be low today 94/69.  She is on low-dose carvedilol for rate control as well as hydrochlorothiazide and low-dose Lasix.  Recently she said she has had improvement in her breathing and for the first time the other day was able to sleep without oxygen.  10/12/2017  Mrs. Poitras was seen again today in follow-up.  She called the office that she noted to have significant weight gain.  In July she was 313 and had lost weight down to 297.  Subsequently was found out that she was possibly overtreated  with her thyroid medication and her dose was decreased from 300 mcg to 200 mcg daily by her endocrinologist.  Since then she has gained weight but she has been up 12 pounds over the past 2 weeks.  She does report some edema.  I had stopped her hydrochlorothiazide due to hypotension and blood pressure is better today 112/70.  She takes low-dose  Lasix 20 mg daily.  She also reported that she now has stage III chronic kidney disease.  12/13/2017  Mrs. Forsee returns today for follow-up.  She is done well and felt very stable.  Although her weight is up about 10 pounds she denies any worsening fluid.  She remains on 20 mg Lasix daily.  She is been working with her endocrinologist who reduced her levothyroxine.  Some of the weight gain may be related to that as she was on a very high dose of 300 mcg daily.  She denies any worsening shortness of breath.  She is having problems with her right shoulder and is scheduled to go arthroplasty by Dr. Alma Friendly.  She is here today for preoperative risk assessment.  She denies any chest pain.  She is on Eliquis for A. fib which is persistent but rate control.  She would have to stop this typically about 3 days prior to the procedure.  01/15/2018  JillianHumm is seen today for follow-up of recent surgery.  Actually she did not have surgery rather was induced by anesthesia and then had significant hypotension and instability requiring IV fluids and pressors.  Based on that response anesthesia recommended that the surgery be canceled.  I spoke with Dr. Alma Friendly subsequent to that about the episode and agreed to see her back to help determine why she may have had such a response.  She actually had significant improvement in LV function by echo 6 months ago back to normal.  She has not had recurrent A. fib and has been euvolemic.  She is on a diuretic.  Weight has been going back up however this is been related to changing her thyroid medications as she was iatrogenically hyperthyroid.   Stress testing was not pursued because she has a history of normal coronaries by cath in 2012 and since her EF improved recently with medical therapy alone, this would have been unlikely if she had significant coronary disease.  I suspect that her hypotension was due to being volume depleted and that she had a marked response to the propofol leading to her hypotension.  06/04/2018  Ms. Osuch was seen today for hospital follow-up as a transition of care 7 visit.  Unfortunately she was admitted with confusion and seizure.  She was started on Keppra.  She did recover from this but was noted to be in A. fib with RVR.  Her carvedilol was switched to Toprol-XL 100 mg daily.  As she had hypotension, her diuretic was discontinued.  Today she reports her weight is even lower.  She denies any new swelling.  She has had some confusion and feels hot and chilled at times.  We had difficulty getting her blood pressure today however I believe it is between 90 and 366 systolic.  This is consistent with her discharge blood pressure readings.  She is concerned she may be on too much metoprolol.  PMHx:  Past Medical History:  Diagnosis Date   Anemia    Asthma    flare up last yr   CHF (congestive heart failure) (HCC)    Chronic headache    DOE (dyspnea on exertion)    2D ECHO, 02/12/2012 - EF 60-65%, moderate concentric hypertrophy   Fibromyalgia    nerve pain"left side at waist level" "can't lay on that side without pain" , "HOB elevation helps"   Heart murmur    Hematuria - cause not known    History of kidney stones    x 2 '13, '  14 surgery to remove   Hypertension    SBO (small bowel obstruction) ([facility]) 06/07/2013   Sleep apnea    tested several times and came up negative   Thyroid disease    "goiter"   Transfusion history    10 yrs+    Past Surgical History:  Procedure Laterality Date   CARDIAC CATHETERIZATION  04/04/2010   No significant obstructive coronary artery disease    CARDIOVERSION N/A 08/08/2017   Procedure: CARDIOVERSION;  Surgeon: Pixie Casino, MD;  Location: [facility];  Service: Cardiovascular;  Laterality: N/A;   CHOLECYSTECTOMY  1990   COLONOSCOPY W/ POLYPECTOMY     COLONOSCOPY WITH PROPOFOL N/A 04/10/2014   Procedure: COLONOSCOPY WITH PROPOFOL;  Surgeon: Beryle Beams, MD;  Location: [facility];  Service: Endoscopy;  Laterality: N/A;   DIAGNOSTIC LAPAROSCOPY     x2 bowel obstructions(adhesions)   EYE SURGERY     lasik 20-25 yrs ago   GASTROPLASTY  1985   "weigh loss", 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" ([place])   REVERSE SHOULDER ARTHROPLASTY Right 03/29/2018   Procedure: REVERSE SHOULDER ARTHROPLASTY;  Surgeon: Netta Cedars, MD;  Location: [facility];  Service: Orthopedics;  Laterality: Right;   THYROIDECTOMY     TUBAL LIGATION  1986    FAMHx:  Family History  Problem Relation Age of Onset   Diabetes Mother    Epilepsy Mother    Cancer Mother        Breast   Hypertension Mother    Breast cancer Mother    Kidney disease Father    Diabetes Father    Hypertension Father    Asthma Father    Heart disease Father    Epilepsy Sister    Cancer Maternal Grandmother    Breast cancer Maternal Grandmother    Cancer Paternal Grandmother    Breast cancer Paternal Grandmother     SOCHx:   reports that she has never smoked. She has never used smokeless tobacco. She reports that she does not drink alcohol or use drugs.  ALLERGIES:  Allergies  Allergen Reactions   Norco [Hydrocodone-Acetaminophen] Itching    ROS: Pertinent items noted in HPI and remainder of comprehensive ROS otherwise negative.  HOME MEDS: Current Outpatient Medications  Medication Sig Dispense Refill   acetaminophen (TYLENOL) 650 MG CR tablet Take 1,300 mg by mouth 3 (three) times daily. At 6 am, 2 pm, and 10 pm     apixaban (ELIQUIS) 5 MG TABS tablet Take 1 tablet (5 mg total) by mouth 2 (two) times daily. 60 tablet 0   calcitRIOL  (ROCALTROL) 0.5 MCG capsule TAKE 1 CAPSULE BY MOUTH 2 TIMES DAILY. 180 capsule 0   calcium carbonate (OS-CAL - DOSED IN MG OF ELEMENTAL CALCIUM) 1250 (500 Ca) MG tablet Take 2 tablets (1,000 mg of elemental calcium total) by mouth 2 (two) times daily. (Patient taking differently: Take 2 tablets by mouth 2 (two) times daily with a meal. ) 120 tablet 0   clotrimazole (LOTRIMIN AF) 1 % cream Apply 1 application topically 2 (two) times daily. 30 g 1   cyanocobalamin (,VITAMIN B-12,) 1000 MCG/ML injection Inject 1 mL (1,000 mcg total) into the muscle every 30 (thirty) days. 1 mL 0   cyclobenzaprine (FLEXERIL) 10 MG tablet Take 2 tablets (20 mg total) by mouth 2 (two) times daily. 80 tablet 0   diclofenac sodium (VOLTAREN) 1 % GEL Apply 2 g topically 2 (two) times daily as needed (for knee pain). 100 g 0  DULoxetine (CYMBALTA) 60 MG capsule Take 1 capsule (60 mg total) by mouth daily. 30 capsule 0   eszopiclone (LUNESTA) 2 MG TABS tablet Take 1 tablet (2 mg total) by mouth at bedtime. 20 tablet 0   FERREX 150 150 MG capsule Take 150 mg by mouth 2 (two) times daily.     levETIRAcetam (KEPPRA) 500 MG tablet Take 1 tablet (500 mg total) by mouth 2 (two) times daily. 60 tablet 0   levothyroxine (SYNTHROID, LEVOTHROID) 200 MCG tablet Take 1 tablet (200 mcg total) by mouth daily before breakfast. 30 tablet 0   LYRICA 200 MG capsule Take 1 capsule (200 mg total) by mouth 2 (two) times daily. 40 capsule 0   magnesium oxide (MAG-OX) 400 MG tablet Take 1 tablet (400 mg total) by mouth daily. 30 tablet 0   metoprolol succinate (TOPROL-XL) 50 MG 24 hr tablet Take 2 tablets (100 mg total) by mouth daily for 30 days. Take with or immediately following a meal. 60 tablet 0   mirabegron ER (MYRBETRIQ) 50 MG TB24 tablet Take 1 tablet (50 mg total) by mouth daily. 30 tablet 0   NONFORMULARY OR COMPOUNDED ITEM [facility]:  Antifungal Cream - Terbinafine 3%, Fluconazole 2%, Tea Tree Oil 5%, Urea 10%,  Ibuprofen 2% in DMSO Suspension #23ml. Apply to affected toenail(s) once daily (at bedtime) or twice daily. 30 each 11   senna (SENOKOT) 8.6 MG TABS tablet Take 1 tablet (8.6 mg total) by mouth daily. 120 each 0   traMADol (ULTRAM) 50 MG tablet Take 50 mg by mouth every 6 (six) hours as needed for moderate pain.      traZODone (DESYREL) 50 MG tablet Take 1 tablet (50 mg total) by mouth at bedtime. 30 tablet 0   Vitamin D, Ergocalciferol, (DRISDOL) 1.25 MG (50000 UT) CAPS capsule Take 1 capsule (50,000 Units total) by mouth every Wednesday. 4 capsule 0   methocarbamol (ROBAXIN) 500 MG tablet Take 1 tablet (500 mg total) by mouth 3 (three) times daily as needed. (Patient not taking: Reported on 06/04/2018) 60 tablet 0   oxycodone (OXY-IR) 5 MG capsule Take 1 capsule (5 mg total) by mouth 2 (two) times daily. At 10 am and 6 pm (Patient not taking: Reported on 06/04/2018) 20 capsule 0   No current facility-administered medications for this visit.     LABS/IMAGING: No results found for this or any previous visit (from the past 48 hour(s)). No results found.  VITALS: Pulse 90    Resp 12    Ht 5\' 5"  (1.651 m)    SpO2 97%    BMI 45.97 kg/m   EXAM: General appearance: alert, morbidly obese and in wheelchair Neck: no carotid bruit and no JVD Lungs: diminished breath sounds bilaterally Heart: irregularly irregular rhythm Abdomen: soft, non-tender; bowel sounds normal; no masses,  no organomegaly and morbidly obese Extremities: edema trace bilateral edema Pulses: 2+ and symmetric Skin: Skin color, texture, turgor normal. No rashes or lesions Neurologic: Grossly normal Psych: Pleasant  EKG: Deferred  ASSESSMENT: 1. New seizures 2. Anesthesia-associated hypotension 3. New onset A. fib with RVR-CHADSVASC score of 4 -failed cardioversion on amiodarone 4. Acute diastolic congestive heart failure-LVEF 25% (improved to 60-65% in 06/2017) 5. Super morbid obesity, with failure of 3 gastric  bypass procedures 6. Pulmonary hypertension - PA pressure of 64 mmHg, normal LV systolic function 7. Progressive DOE 8. Hypertension-controlled  9. Possible A. fib/ectopic atrial tachycardia-resolved  PLAN: 1.   Ms. Deblanc had presentation with presumed seizure  and is now on Keppra. Blood pressure is still soft, however, afib is rate controlled. Does not appear volume overloaded on exam. Will continue to hold lasix and advised her on PRN usage for swelling/weight gain. Will decrease Toprol XL to 25 mg QAM and 50 mg QPM. Hopefully, this will be better tolerated and allow a higher BP during the day but still provide adequate rate-control of her afib.  Follow-up in 3 months.  Pixie Casino, MD, [facility], [place] Director of the [facility]  Cardiovascular [facility] Reduction [facility] Diplomate of the American Board of Clinical Lipidology Attending Cardiologist  Direct Dial: [phone]   Fax: [phone]  Website:  [email or web address] Harr 06/04/2018, 2:55 PM

## 2018-06-04 NOTE — Telephone Encounter (Signed)
I contacted the pt to complete pre charting for 4/15 vv. EMR updated   Pt understands that although there may be some limitations with this type of visit, we will take all precautions to reduce any security or privacy concerns.  Pt understands that this will be treated like an in office visit and we will file with pt's insurance, and there may be a patient responsible charge related to this service.  Pt's email is [email or web address]. Pt understands that the [employer] webex software must be downloaded and operational on the device pt plans to use for the visit.

## 2018-06-04 NOTE — Patient Instructions (Signed)
Medication Instructions:  Decrease: Metoprolol  25 mg in the morning and 50 mg at night. Take: Lasix 20 mg daily as needed for swelling.  If you need a refill on your cardiac medications before your next appointment, please call your pharmacy.   Lab work: None  Testing/Procedures: None  Follow-Up: At [facility], you and your health needs are our priority.  As part of our continuing mission to provide you with exceptional heart care, we have created designated Provider Care Teams.  These Care Teams include your primary Cardiologist (physician) and Advanced Practice Providers (APPs -  Physician Assistants and Nurse Practitioners) who all work together to provide you with the care you need, when you need it. You will need a follow up appointment in 3 months.  Please call our office 2 months in advance to schedule this appointment.  You may see Pixie Casino, MD or one of the following Advanced Practice Providers on your designated Care Team: [place], [place] . Fabian Sharp, PA-C

## 2018-06-05 ENCOUNTER — Encounter: Payer: Self-pay | Admitting: Neurology

## 2018-06-05 ENCOUNTER — Ambulatory Visit (INDEPENDENT_AMBULATORY_CARE_PROVIDER_SITE_OTHER): Payer: Medicare Other | Admitting: Neurology

## 2018-06-05 DIAGNOSIS — R569 Unspecified convulsions: Secondary | ICD-10-CM

## 2018-06-05 DIAGNOSIS — G40909 Epilepsy, unspecified, not intractable, without status epilepticus: Secondary | ICD-10-CM | POA: Diagnosis not present

## 2018-06-05 DIAGNOSIS — I110 Hypertensive heart disease with heart failure: Secondary | ICD-10-CM | POA: Diagnosis not present

## 2018-06-05 DIAGNOSIS — I480 Paroxysmal atrial fibrillation: Secondary | ICD-10-CM | POA: Diagnosis not present

## 2018-06-05 DIAGNOSIS — I509 Heart failure, unspecified: Secondary | ICD-10-CM | POA: Diagnosis not present

## 2018-06-05 DIAGNOSIS — J45909 Unspecified asthma, uncomplicated: Secondary | ICD-10-CM | POA: Diagnosis not present

## 2018-06-05 DIAGNOSIS — I5040 Unspecified combined systolic (congestive) and diastolic (congestive) heart failure: Secondary | ICD-10-CM | POA: Diagnosis not present

## 2018-06-05 DIAGNOSIS — J9610 Chronic respiratory failure, unspecified whether with hypoxia or hypercapnia: Secondary | ICD-10-CM | POA: Diagnosis not present

## 2018-06-05 NOTE — Progress Notes (Signed)
The patient was unable to join the meeting, will be rescheduled.

## 2018-06-06 ENCOUNTER — Encounter: Payer: Self-pay | Admitting: Neurology

## 2018-06-06 ENCOUNTER — Ambulatory Visit (INDEPENDENT_AMBULATORY_CARE_PROVIDER_SITE_OTHER): Payer: Medicare Other | Admitting: Neurology

## 2018-06-06 ENCOUNTER — Other Ambulatory Visit: Payer: Self-pay

## 2018-06-06 DIAGNOSIS — I639 Cerebral infarction, unspecified: Secondary | ICD-10-CM | POA: Diagnosis not present

## 2018-06-06 DIAGNOSIS — M797 Fibromyalgia: Secondary | ICD-10-CM

## 2018-06-06 DIAGNOSIS — R569 Unspecified convulsions: Secondary | ICD-10-CM

## 2018-06-06 NOTE — Progress Notes (Signed)
Virtual Visit via Telephone Note  I connected with Jillian Eaton on 06/06/18 at  9:30 AM EDT by telephone and verified that I am speaking with the correct person using two identifiers.   I discussed the limitations, risks, security and privacy concerns of performing an evaluation and management service by telephone and the availability of in person appointments. I also discussed with the patient that there may be a patient responsible charge related to this service. The patient expressed understanding and agreed to proceed.   History of Present Illness: Jillian Eaton is a 68 year old black female with a history of obesity, fibromyalgia, sleep apnea, and atrial fibrillation.  The patient has a strong family history of seizures, her sister died at age 10 with seizures since age 88 and her mother had seizures at age 56.  The patient was in the emergency room on 25 May 2018 with shortness of breath, confusion, and fever.  The patient was seen and checked for the COVID virus and was negative, she was sent home.  The next morning, her husband noted that she was having a generalized seizure in bed.  The patient has no recollection of this event.  She was sent to the hospital and was admitted for work-up.  There was some concern about a potential stroke, MRI of the brain was done and did not show acute ischemia.  The patient underwent an EEG study that showed generalized slowing, no definite epileptiform activity was seen.  The patient was placed on Keppra, she currently is on 500 mg twice daily.  She has returned to her usual baseline but still is having some chills and sweats.  She is no longer running fevers, her shortness of breath has improved.  The patient has not had any recurring seizures.  She is sent to this office for further evaluation.   Observations/Objective: WebEx evaluation reveals that the patient is alert and cooperative.  Facial symmetry is present.  The patient has full  extraocular movements.  She is able to protrude the tongue midline with good lateral movement of the tongue.  She is able to ambulate with a walker, she has a slow deliberate gait.  Romberg is negative.  Finger-nose-finger and heel shin are symmetric and normal.  Assessment and Plan: 1.  New onset seizure  The patient has been on Lyrica for quite some time for her fibromyalgia but she suffered a generalized seizure around the time of a febrile illness.  She does have a strong family history for seizures.  MRI of the brain was done in the hospital was poor quality due to movement artifact but diffusion studies did not show evidence of acute ischemia.  At some point, MRI of the brain with and without gadolinium will need to be done.  The patient will remain on Keppra for now, it is possible in the future that she may be able to come off this medication.  She is not to operate a motor vehicle for least 6 months.  She will follow-up here in 3 months.  Follow Up Instructions:    I discussed the assessment and treatment plan with the patient. The patient was provided an opportunity to ask questions and all were answered. The patient agreed with the plan and demonstrated an understanding of the instructions.   The patient was advised to call back or seek an in-person evaluation if the symptoms worsen or if the condition fails to improve as anticipated.  I provided 30 minutes of  non-face-to-face time during this encounter.   Kathrynn Ducking, MD

## 2018-06-07 DIAGNOSIS — I110 Hypertensive heart disease with heart failure: Secondary | ICD-10-CM | POA: Diagnosis not present

## 2018-06-07 DIAGNOSIS — J45909 Unspecified asthma, uncomplicated: Secondary | ICD-10-CM | POA: Diagnosis not present

## 2018-06-07 DIAGNOSIS — I480 Paroxysmal atrial fibrillation: Secondary | ICD-10-CM | POA: Diagnosis not present

## 2018-06-07 DIAGNOSIS — J9610 Chronic respiratory failure, unspecified whether with hypoxia or hypercapnia: Secondary | ICD-10-CM | POA: Diagnosis not present

## 2018-06-07 DIAGNOSIS — G40909 Epilepsy, unspecified, not intractable, without status epilepticus: Secondary | ICD-10-CM | POA: Diagnosis not present

## 2018-06-07 DIAGNOSIS — I5040 Unspecified combined systolic (congestive) and diastolic (congestive) heart failure: Secondary | ICD-10-CM | POA: Diagnosis not present

## 2018-06-10 ENCOUNTER — Telehealth: Payer: Self-pay | Admitting: Neurology

## 2018-06-10 ENCOUNTER — Encounter: Payer: Self-pay | Admitting: Internal Medicine

## 2018-06-10 DIAGNOSIS — G40909 Epilepsy, unspecified, not intractable, without status epilepticus: Secondary | ICD-10-CM | POA: Diagnosis not present

## 2018-06-10 NOTE — Telephone Encounter (Signed)
I contacted the pt. She wanted to verify Dr. Jannifer Franklin recommendation from 06/06/18. I advised per Dr. Jannifer Franklin note right now she is to remain on keppra. I stated Dr. Jannifer Franklin noted she may be able to d/c this medication in in the future but we would have to wait and see. Pt was agreeable to this information.  I also advised Dr. Jannifer Franklin mentioned a MRI would need to be repeated in the future. Pt was agreeable to this information as well and had no further questions/concerns.    Pt states for her 3 month f/u she would prefer to see Dr. Jannifer Franklin face to face and then she would look a f/u with NP's. I have rescheduled her for 09/10/18 at 12 pm with Dr. Jannifer Franklin.

## 2018-06-10 NOTE — Telephone Encounter (Signed)
Pt requesting a call to discuss thursdays virtual visit- stating she is unsure of the out come from the visit. Also would like to discuss why Dr. Jannifer Franklin asked her to f/u with NP (offered to schedule with Dr. Jannifer Franklin pt did not wish to wait until 10/1). Pt has questions regarding her seizures and seizure medication please advise.

## 2018-06-11 DIAGNOSIS — I480 Paroxysmal atrial fibrillation: Secondary | ICD-10-CM | POA: Diagnosis not present

## 2018-06-11 DIAGNOSIS — J45909 Unspecified asthma, uncomplicated: Secondary | ICD-10-CM | POA: Diagnosis not present

## 2018-06-11 DIAGNOSIS — J9610 Chronic respiratory failure, unspecified whether with hypoxia or hypercapnia: Secondary | ICD-10-CM | POA: Diagnosis not present

## 2018-06-11 DIAGNOSIS — R569 Unspecified convulsions: Secondary | ICD-10-CM | POA: Diagnosis not present

## 2018-06-11 DIAGNOSIS — Z7689 Persons encountering health services in other specified circumstances: Secondary | ICD-10-CM | POA: Diagnosis not present

## 2018-06-11 DIAGNOSIS — F5101 Primary insomnia: Secondary | ICD-10-CM | POA: Diagnosis not present

## 2018-06-11 DIAGNOSIS — I5040 Unspecified combined systolic (congestive) and diastolic (congestive) heart failure: Secondary | ICD-10-CM | POA: Diagnosis not present

## 2018-06-11 DIAGNOSIS — G40909 Epilepsy, unspecified, not intractable, without status epilepticus: Secondary | ICD-10-CM | POA: Diagnosis not present

## 2018-06-11 DIAGNOSIS — I110 Hypertensive heart disease with heart failure: Secondary | ICD-10-CM | POA: Diagnosis not present

## 2018-06-13 DIAGNOSIS — G40909 Epilepsy, unspecified, not intractable, without status epilepticus: Secondary | ICD-10-CM | POA: Diagnosis not present

## 2018-06-13 DIAGNOSIS — I5040 Unspecified combined systolic (congestive) and diastolic (congestive) heart failure: Secondary | ICD-10-CM | POA: Diagnosis not present

## 2018-06-13 DIAGNOSIS — J9610 Chronic respiratory failure, unspecified whether with hypoxia or hypercapnia: Secondary | ICD-10-CM | POA: Diagnosis not present

## 2018-06-13 DIAGNOSIS — I110 Hypertensive heart disease with heart failure: Secondary | ICD-10-CM | POA: Diagnosis not present

## 2018-06-13 DIAGNOSIS — J45909 Unspecified asthma, uncomplicated: Secondary | ICD-10-CM | POA: Diagnosis not present

## 2018-06-13 DIAGNOSIS — I480 Paroxysmal atrial fibrillation: Secondary | ICD-10-CM | POA: Diagnosis not present

## 2018-06-14 ENCOUNTER — Other Ambulatory Visit: Payer: Self-pay | Admitting: Internal Medicine

## 2018-06-14 NOTE — Telephone Encounter (Signed)
Levothyroxine refilled

## 2018-06-17 DIAGNOSIS — G40909 Epilepsy, unspecified, not intractable, without status epilepticus: Secondary | ICD-10-CM | POA: Diagnosis not present

## 2018-06-18 DIAGNOSIS — G40909 Epilepsy, unspecified, not intractable, without status epilepticus: Secondary | ICD-10-CM | POA: Diagnosis not present

## 2018-06-18 DIAGNOSIS — I5040 Unspecified combined systolic (congestive) and diastolic (congestive) heart failure: Secondary | ICD-10-CM | POA: Diagnosis not present

## 2018-06-18 DIAGNOSIS — I110 Hypertensive heart disease with heart failure: Secondary | ICD-10-CM | POA: Diagnosis not present

## 2018-06-18 DIAGNOSIS — J9610 Chronic respiratory failure, unspecified whether with hypoxia or hypercapnia: Secondary | ICD-10-CM | POA: Diagnosis not present

## 2018-06-18 DIAGNOSIS — J45909 Unspecified asthma, uncomplicated: Secondary | ICD-10-CM | POA: Diagnosis not present

## 2018-06-18 DIAGNOSIS — I480 Paroxysmal atrial fibrillation: Secondary | ICD-10-CM | POA: Diagnosis not present

## 2018-06-20 DIAGNOSIS — J45909 Unspecified asthma, uncomplicated: Secondary | ICD-10-CM | POA: Diagnosis not present

## 2018-06-20 DIAGNOSIS — G40909 Epilepsy, unspecified, not intractable, without status epilepticus: Secondary | ICD-10-CM | POA: Diagnosis not present

## 2018-06-20 DIAGNOSIS — I110 Hypertensive heart disease with heart failure: Secondary | ICD-10-CM | POA: Diagnosis not present

## 2018-06-20 DIAGNOSIS — I480 Paroxysmal atrial fibrillation: Secondary | ICD-10-CM | POA: Diagnosis not present

## 2018-06-20 DIAGNOSIS — I5040 Unspecified combined systolic (congestive) and diastolic (congestive) heart failure: Secondary | ICD-10-CM | POA: Diagnosis not present

## 2018-06-20 DIAGNOSIS — J9610 Chronic respiratory failure, unspecified whether with hypoxia or hypercapnia: Secondary | ICD-10-CM | POA: Diagnosis not present

## 2018-06-21 ENCOUNTER — Other Ambulatory Visit: Payer: Self-pay | Admitting: Internal Medicine

## 2018-06-21 DIAGNOSIS — R5383 Other fatigue: Secondary | ICD-10-CM | POA: Diagnosis not present

## 2018-06-24 ENCOUNTER — Other Ambulatory Visit: Payer: Self-pay | Admitting: Internal Medicine

## 2018-06-24 ENCOUNTER — Encounter: Payer: Self-pay | Admitting: Internal Medicine

## 2018-06-25 DIAGNOSIS — Z471 Aftercare following joint replacement surgery: Secondary | ICD-10-CM | POA: Diagnosis not present

## 2018-06-25 DIAGNOSIS — Z4789 Encounter for other orthopedic aftercare: Secondary | ICD-10-CM | POA: Diagnosis not present

## 2018-06-26 DIAGNOSIS — I5040 Unspecified combined systolic (congestive) and diastolic (congestive) heart failure: Secondary | ICD-10-CM | POA: Diagnosis not present

## 2018-06-26 DIAGNOSIS — I110 Hypertensive heart disease with heart failure: Secondary | ICD-10-CM | POA: Diagnosis not present

## 2018-06-26 DIAGNOSIS — J45909 Unspecified asthma, uncomplicated: Secondary | ICD-10-CM | POA: Diagnosis not present

## 2018-06-26 DIAGNOSIS — J9610 Chronic respiratory failure, unspecified whether with hypoxia or hypercapnia: Secondary | ICD-10-CM | POA: Diagnosis not present

## 2018-06-26 DIAGNOSIS — G40909 Epilepsy, unspecified, not intractable, without status epilepticus: Secondary | ICD-10-CM | POA: Diagnosis not present

## 2018-06-26 DIAGNOSIS — I480 Paroxysmal atrial fibrillation: Secondary | ICD-10-CM | POA: Diagnosis not present

## 2018-06-27 ENCOUNTER — Other Ambulatory Visit: Payer: Self-pay | Admitting: *Deleted

## 2018-06-27 DIAGNOSIS — I110 Hypertensive heart disease with heart failure: Secondary | ICD-10-CM | POA: Diagnosis not present

## 2018-06-27 DIAGNOSIS — I480 Paroxysmal atrial fibrillation: Secondary | ICD-10-CM | POA: Diagnosis not present

## 2018-06-27 DIAGNOSIS — G40909 Epilepsy, unspecified, not intractable, without status epilepticus: Secondary | ICD-10-CM | POA: Diagnosis not present

## 2018-06-27 DIAGNOSIS — I5040 Unspecified combined systolic (congestive) and diastolic (congestive) heart failure: Secondary | ICD-10-CM | POA: Diagnosis not present

## 2018-06-27 DIAGNOSIS — J45909 Unspecified asthma, uncomplicated: Secondary | ICD-10-CM | POA: Diagnosis not present

## 2018-06-27 DIAGNOSIS — J9610 Chronic respiratory failure, unspecified whether with hypoxia or hypercapnia: Secondary | ICD-10-CM | POA: Diagnosis not present

## 2018-06-27 MED ORDER — METOPROLOL SUCCINATE ER 25 MG PO TB24: 25.0000 mg | ORAL_TABLET | Freq: Every day | ORAL | 3 refills | Status: DC

## 2018-06-27 MED ORDER — METOPROLOL SUCCINATE ER 25 MG PO TB24: ORAL_TABLET | ORAL | 3 refills | Status: DC

## 2018-06-27 MED ORDER — METOPROLOL SUCCINATE ER 50 MG PO TB24: ORAL_TABLET | ORAL | 3 refills | Status: DC

## 2018-06-27 NOTE — Telephone Encounter (Signed)
Per mychart message patient requested Rx for Metoprolol Suc 50 mg to be sent to take in the evening Rx sent as requested

## 2018-06-30 ENCOUNTER — Telehealth (HOSPITAL_COMMUNITY): Payer: Self-pay | Admitting: Neurology

## 2018-06-30 ENCOUNTER — Other Ambulatory Visit (HOSPITAL_COMMUNITY): Payer: Self-pay | Admitting: Neurology

## 2018-06-30 MED ORDER — LEVETIRACETAM 500 MG PO TABS: 500.0000 mg | ORAL_TABLET | Freq: Two times a day (BID) | ORAL | 0 refills | Status: DC

## 2018-06-30 NOTE — Telephone Encounter (Signed)
I returned patient`s call requesting refill for keppra and I authorized 30 day supply no refills.

## 2018-07-01 DIAGNOSIS — I5040 Unspecified combined systolic (congestive) and diastolic (congestive) heart failure: Secondary | ICD-10-CM | POA: Diagnosis not present

## 2018-07-01 DIAGNOSIS — D649 Anemia, unspecified: Secondary | ICD-10-CM | POA: Diagnosis not present

## 2018-07-01 DIAGNOSIS — G4700 Insomnia, unspecified: Secondary | ICD-10-CM | POA: Diagnosis not present

## 2018-07-01 DIAGNOSIS — F329 Major depressive disorder, single episode, unspecified: Secondary | ICD-10-CM | POA: Diagnosis not present

## 2018-07-01 DIAGNOSIS — J9610 Chronic respiratory failure, unspecified whether with hypoxia or hypercapnia: Secondary | ICD-10-CM | POA: Diagnosis not present

## 2018-07-01 DIAGNOSIS — M810 Age-related osteoporosis without current pathological fracture: Secondary | ICD-10-CM | POA: Diagnosis not present

## 2018-07-01 DIAGNOSIS — J45909 Unspecified asthma, uncomplicated: Secondary | ICD-10-CM | POA: Diagnosis not present

## 2018-07-01 DIAGNOSIS — M1990 Unspecified osteoarthritis, unspecified site: Secondary | ICD-10-CM | POA: Diagnosis not present

## 2018-07-01 DIAGNOSIS — Z7901 Long term (current) use of anticoagulants: Secondary | ICD-10-CM | POA: Diagnosis not present

## 2018-07-01 DIAGNOSIS — G40909 Epilepsy, unspecified, not intractable, without status epilepticus: Secondary | ICD-10-CM | POA: Diagnosis not present

## 2018-07-01 DIAGNOSIS — K219 Gastro-esophageal reflux disease without esophagitis: Secondary | ICD-10-CM | POA: Diagnosis not present

## 2018-07-01 DIAGNOSIS — E039 Hypothyroidism, unspecified: Secondary | ICD-10-CM | POA: Diagnosis not present

## 2018-07-01 DIAGNOSIS — M797 Fibromyalgia: Secondary | ICD-10-CM | POA: Diagnosis not present

## 2018-07-01 DIAGNOSIS — I480 Paroxysmal atrial fibrillation: Secondary | ICD-10-CM | POA: Diagnosis not present

## 2018-07-01 DIAGNOSIS — I110 Hypertensive heart disease with heart failure: Secondary | ICD-10-CM | POA: Diagnosis not present

## 2018-07-01 DIAGNOSIS — Z9181 History of falling: Secondary | ICD-10-CM | POA: Diagnosis not present

## 2018-07-01 DIAGNOSIS — G894 Chronic pain syndrome: Secondary | ICD-10-CM | POA: Diagnosis not present

## 2018-07-02 DIAGNOSIS — J9610 Chronic respiratory failure, unspecified whether with hypoxia or hypercapnia: Secondary | ICD-10-CM | POA: Diagnosis not present

## 2018-07-02 DIAGNOSIS — I480 Paroxysmal atrial fibrillation: Secondary | ICD-10-CM | POA: Diagnosis not present

## 2018-07-02 DIAGNOSIS — G40909 Epilepsy, unspecified, not intractable, without status epilepticus: Secondary | ICD-10-CM | POA: Diagnosis not present

## 2018-07-02 DIAGNOSIS — I110 Hypertensive heart disease with heart failure: Secondary | ICD-10-CM | POA: Diagnosis not present

## 2018-07-02 DIAGNOSIS — I5040 Unspecified combined systolic (congestive) and diastolic (congestive) heart failure: Secondary | ICD-10-CM | POA: Diagnosis not present

## 2018-07-02 DIAGNOSIS — J45909 Unspecified asthma, uncomplicated: Secondary | ICD-10-CM | POA: Diagnosis not present

## 2018-07-03 ENCOUNTER — Telehealth: Payer: Self-pay | Admitting: Neurology

## 2018-07-03 NOTE — Telephone Encounter (Signed)
I called the patient.  The patient is on a starting dose of Keppra, she seems to be tolerating this fairly well.  The initial seizure occurred around the time of a fever, she will stay on the [place] for now, she does not operate a motor vehicle.  She will follow-up in 3 months.  She will call when she needs a prescription for her Keppra.

## 2018-07-03 NOTE — Telephone Encounter (Signed)
Pt called wanting to know if she should be taking such a high dosage of her Keppra. Pt would also like to discuss why she is getting these seizures she would like to know if it is a possibility that it can be  because of her hypothalamus. Please advise.

## 2018-07-05 ENCOUNTER — Encounter: Payer: Self-pay | Admitting: Internal Medicine

## 2018-07-08 ENCOUNTER — Ambulatory Visit (INDEPENDENT_AMBULATORY_CARE_PROVIDER_SITE_OTHER): Payer: Medicare Other | Admitting: Internal Medicine

## 2018-07-08 ENCOUNTER — Encounter: Payer: Self-pay | Admitting: Internal Medicine

## 2018-07-08 ENCOUNTER — Other Ambulatory Visit: Payer: Self-pay

## 2018-07-08 VITALS — BP 126/70 | HR 85 | Ht 65.0 in

## 2018-07-08 DIAGNOSIS — M810 Age-related osteoporosis without current pathological fracture: Secondary | ICD-10-CM

## 2018-07-08 DIAGNOSIS — E890 Postprocedural hypothyroidism: Secondary | ICD-10-CM

## 2018-07-08 DIAGNOSIS — C73 Malignant neoplasm of thyroid gland: Secondary | ICD-10-CM

## 2018-07-08 DIAGNOSIS — I639 Cerebral infarction, unspecified: Secondary | ICD-10-CM

## 2018-07-08 NOTE — Progress Notes (Addendum)
Patient ID: Jillian Eaton, female   DOB: August 16, 1950, 68 y.o.   MRN: [id number]    HPI  Jillian Eaton is a 68 y.o.-year-old female, initially referred by her PCP, Dr. Sabas Sous, returning for h/o thyroid microcarcinoma,  postsurgical hypothyroidism, postsurgical hypocalcemia, osteoporosis. Last visit 7 months ago.  Since last visit, she had shoulder surgery 03/2018.  She also had a seizure 05/2018.  She was suspected for stroke, but this was ruled out. Of note, upon questioning, she stopped Lyrica 1 week prior to the seizure.  She was taking this for fibromyalgia.  She was started on Keppra.  Reviewing her chart, on 05/08/2018 she had a very low glucose on labs: 29!  She does not have a history of diabetes but has a history of steroid-induced hyperglycemia.  She is not on medications for this.  Since last visit, we started Prolia.  She had the first dose 01/03/2018.  No hip/thigh pain, no jaw pain.  She is due for another injection today.  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 Fredirick Maudlin at [facility].  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  She developed postsurgical hypothyroidism and, unfortunately, also postsurgical hypocalcemia, both uncontrolled.  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 Benign hyperplastic thyroid tissue. See comment. COMMENT: The papillary carcinoma appears to be incidental.  On 06/19/2017 she developed atrial fibrillation.  At that time, her TSH is very suppressed, at 0.07.  She was previously on 300 mcg levothyroxine daily, then decreased to 200 mcg daily.  On this dose, most recent TSH is normal 2 months ago.  Pt is  on levothyroxine 200 mcg daily, taken: - in am (at 5 AM) - fasting - at least 30 min from b'fast - + Ca at 8:30 AM.  She has a history of iron infusions, then took po iron >> now off - no MVI, PPIs - not on Biotin  Review TFTs: 05/08/2018: TSH 2.87 Lab Results  Component Value Date   TSH 1.21 11/27/2017   TSH 0.034 (L) 09/21/2017   TSH 1.48 08/03/2017   TSH 0.007 (L) 06/19/2017   TSH 0.046 (L) 08/29/2016   TSH 9.239 (H) 04/25/2016   TSH 68.907 (H) 03/10/2016   FREET4 1.41 11/27/2017   FREET4 2.87 (H) 09/21/2017   FREET4 1.04 08/03/2017   FREET4 2.43 (H) 06/19/2017   FREET4 1.04 08/29/2016   Pt denies: - feeling nodules in neck - hoarseness - choking - SOB with lying down But has chronic dysphagia  No FH of thyroid cancer. No h/o radiation tx to head or neck.  No seaweed or kelp. No recent contrast studies. No herbal supplements. No Biotin use. No recent steroids use.   Hypocalcemia:  Iatrogenic, after thyroid surgery, despite the fact that no parathyroid glands were resected.  Her PTH level is normal, despite the low calcium level, possibly likely related to her CKD.  She was admitted with hypercalcemia and acute respiratory failure on 07/10/2017.  Reviewed labs from Dr. Maia Petties, patient's PCP, drawn on 05/08/2018: -CMP: Glucose 29 (!),  BUN/creatinine 19/0.94, GFR 74, calcium 8.3 (8.7-10.3), Albumin 3.6 (3.8-4.8) -Vitamin D 39.8 -TSH 2.87 -Phosphorus 3.6 (3-4.3) -Magnesium 1.6 (1.6-2.3)  -Ferritin 29 (15-150), hemoglobin 11.1 (11.1-15.9) Corrected calcium: 8.62, slightly lower than normal, which is our target.  I am surprised by her very low glucose (I am not sure if this is correct or just a lab artifact).  She does not have a history of diabetes but has steroid-induced hyperglycemia...  She is not on medications for this.  Previously: Lab Results  Component Value Date   PTH 40 11/27/2017   PTH 44 08/03/2017   PTH 65 07/10/2017   PTH Comment 07/10/2017    CALCIUM 8.8 (L) 05/29/2018   CALCIUM 8.5 (L) 05/27/2018   CALCIUM 8.7 (L) 05/26/2018   CALCIUM 8.9 05/25/2018   CALCIUM 6.9 (L) 03/30/2018   CALCIUM 7.7 (L) 03/29/2018   CALCIUM 7.1 (L) 03/25/2018   CALCIUM 6.2 (LL) 03/21/2018   CALCIUM 9.1 01/03/2018   CALCIUM 8.9 11/27/2017  10/09/2017: Ca 8.1 (8.7-10.3)  She has a history of low magnesium high phosphorus: Both normal at last check: Lab Results  Component Value Date   MG 1.6 11/27/2017   MG 1.5 08/03/2017   MG 2.1 07/15/2017   MG 1.6 (L) 07/14/2017   MG 1.3 (L) 07/13/2017   MG 1.2 (L) 07/10/2017   MG 1.8 07/11/2016   MG 2.2 07/09/2016   MG 1.6 (L) 07/09/2016   MG 1.9 07/08/2016   MG 2.0 07/08/2016   MG 1.6 (L) 07/08/2016   MG 1.6 (L) 05/03/2016   MG 1.8 04/25/2016   MG 1.9 04/25/2016   MG 2.0 04/24/2016   MG 1.2 (L) 04/23/2016   MG 2.2 03/12/2016   MG 1.4 (L) 03/10/2016  10/09/2017: Mg 2.0 (1.6-2.3)  Lab Results  Component Value Date   PHOS 3.8 11/27/2017   PHOS 6.3 (H) 08/03/2017   PHOS 7.4 (H) 07/10/2017   PHOS 3.6 07/11/2016   PHOS 4.0 07/09/2016   PHOS 3.5 07/09/2016   PHOS 5.2 (H) 07/08/2016   PHOS 6.6 (H) 07/08/2016   PHOS 8.0 (H) 07/08/2016   PHOS 3.6 04/25/2016   PHOS 3.5 04/25/2016   PHOS 3.3 04/24/2016   PHOS 3.7 03/12/2016   PHOS 6.2 (H) 03/10/2016  10/09/2017: phos 4.5 (2.5-4.5)  She is on: - CaCO3 1500 mg 3x >> 2x a day with meals - high-dose vitamin D 50,000 U weekly - calcitriol 0.5 mcg 2x a day  We also started HCTZ 25 mg daily.  In the meantime, we reduced her Lasix to 50% of her previous dose.  She is currently off HCTZ per nephrology.   She denies hand cramping or perioral numbness.  She has a history of vitamin D deficiency.  Latest level normal: 05/08/2018: Vitamin D 39.8 Lab Results  Component Value Date   VD25OH 45.84 11/27/2017   VD25OH 43.9 07/14/2017   At last visit, we checked her calcitriol level and this was not elevated: Component     Latest Ref Rng & Units  11/27/2017  Vitamin D 1, 25 (OH) Total     18 - 72 pg/mL 54  Vitamin D3 1, 25 (OH)     pg/mL 30  Vitamin D2 1, 25 (OH)     pg/mL 24   Osteoporosis:  DXA  - [facility] (12/10/2017) Lumbar spine L1-L4 (L2) Femoral neck (FN) 33% distal left radius Ultra distal left radius  T-score -1.6 RFN: N/a LFN: -2.8  -2.3  -3.5   Assessment: Patient has OSTEOPOROSIS according to the [facility] classification for osteoporosis (see below). Fracture risk: high Comments: the technical quality of the study is good, however, the right femoral neck could not be analyzed as the patient could not internally rotate the right hip.  The left radius was analyzed instead.  Also, L2 vertebra had to be excluded from analysis due to degenerative changes.  We started Prolia, first dose 01/03/2018.  No thigh/hip/jaw pain.  She has CKD. Last BUN/Cr: Lab Results  Component Value Date   BUN 20 05/29/2018   CREATININE 1.03 (H) 05/29/2018   She recently had surgery for torn rotator cuffs in both shoulders. She has a history of atrial fibrillation, status post cardioversion. She was on amiodarone before, now off.  ROS: Constitutional: no weight gain/no weight loss, no fatigue, + subjective hyperthermia, + subjective hypothermia Eyes: no blurry vision, no xerophthalmia ENT: no sore throat, no nodules palpated in neck, no dysphagia, no odynophagia, no hoarseness Cardiovascular: no CP/no SOB/no palpitations/no leg swelling Respiratory: no cough/no SOB/no wheezing Gastrointestinal: no N/no V/no D/no C/no acid reflux Musculoskeletal: no muscle aches/no joint aches Skin: no rashes, no hair loss Neurological: no tremors/no numbness/no tingling/no dizziness  I reviewed pt's medications, allergies, PMH, social hx, family hx, and changes were documented in the history of present illness. Otherwise, unchanged from my initial visit note.  Past Medical History:  Diagnosis Date  . Anemia   . Asthma    flare up last yr  . CHF  (congestive heart failure) ([place])   . Chronic headache   . DOE (dyspnea on exertion)    2D ECHO, 02/12/2012 - EF 60-65%, moderate concentric hypertrophy  . Fibromyalgia    nerve pain"left side at waist level" "can't lay on that side without pain" , "HOB elevation helps"  . Heart murmur   . Hematuria - cause not known   . History of kidney stones    x 2 '13, '14 surgery to remove  . Hypertension   . SBO (small bowel obstruction) ([place]) 06/07/2013  . Sleep apnea    tested several times and came up negative  . Thyroid disease    "goiter"  . Transfusion history    10 yrs+   Past Surgical History:  Procedure Laterality Date  . CARDIAC CATHETERIZATION  04/04/2010   No significant obstructive coronary artery disease  . CARDIOVERSION N/A 08/08/2017   Procedure: CARDIOVERSION;  Surgeon: Pixie Casino, MD;  Location: [facility];  Service: Cardiovascular;  Laterality: N/A;  . CHOLECYSTECTOMY  1990  . COLONOSCOPY W/ POLYPECTOMY    . COLONOSCOPY WITH PROPOFOL N/A 04/10/2014   Procedure: COLONOSCOPY WITH PROPOFOL;  Surgeon: Beryle Beams, MD;  Location: [facility];  Service: Endoscopy;  Laterality: N/A;  . DIAGNOSTIC LAPAROSCOPY     x2 bowel obstructions(adhesions)  . EYE SURGERY     lasik 20-25 yrs ago  . 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 REVERSE SHOULDER ARTHROPLASTY Right 03/29/2018   Procedure: REVERSE SHOULDER ARTHROPLASTY;  Surgeon: Netta Cedars, MD;  Location: [facility];  Service: Orthopedics;  Laterality: Right;  . THYROIDECTOMY    . TUBAL LIGATION  1986   Social History   Socioeconomic History  . Marital status: Married    Spouse name: Not on file  . Number of children: 3  . Years of education: Not on file  . Highest education level: Master's degree (e.g., MA, MS, MEng, MEd, MSW, MBA)  Occupational History  . Occupation: Self employed Armed forces operational officer  Social Needs  . Financial resource strain: Not on file  . Food insecurity:     Worry: Not on file    Inability: Not on file  . Transportation needs:    Medical:  Not on file    Non-medical: Not on file  Tobacco Use  . Smoking status: Never Smoker  . Smokeless tobacco: Never Used  Substance and Sexual Activity  . Alcohol use: No    Comment: wine occ  . Drug use: No    Types: Oxycodone    Comment: perscribed  . Sexual activity: Not Currently    Birth control/protection: None  Lifestyle  . Physical activity:    Days per week: Not on file    Minutes per session: Not on file  . Stress: Not on file  Relationships  . Social connections:    Talks on phone: Not on file    Gets together: Not on file    Attends religious service: Not on file    Active member of club or organization: Not on file    Attends meetings of clubs or organizations: Not on file    Relationship status: Not on file  . Intimate partner violence:    Fear of current or ex partner: Not on file    Emotionally abused: Not on file    Physically abused: Not on file    Forced sexual activity: Not on file  Other Topics Concern  . Not on file  Social History Narrative   Right handed   1 cup of caffeine per day        Current Outpatient Medications on File Prior to Visit  Medication Sig Dispense Refill  . acetaminophen (TYLENOL) 650 MG CR tablet Take 1,300 mg by mouth 3 (three) times daily. At 6 am, 2 pm, and 10 pm    . apixaban (ELIQUIS) 5 MG TABS tablet Take 1 tablet (5 mg total) by mouth 2 (two) times daily. 60 tablet 0  . calcitRIOL (ROCALTROL) 0.5 MCG capsule TAKE 1 CAPSULE BY MOUTH 2 TIMES DAILY. 180 capsule 0  . calcium carbonate (OS-CAL - DOSED IN MG OF ELEMENTAL CALCIUM) 1250 (500 Ca) MG tablet Take 2 tablets (1,000 mg of elemental calcium total) by mouth 2 (two) times daily. (Patient taking differently: Take 2 tablets by mouth 2 (two) times daily with a meal. ) 120 tablet 0  . clotrimazole (LOTRIMIN AF) 1 % cream Apply 1 application topically 2 (two) times daily. 30 g 1  . cyanocobalamin  (,VITAMIN B-12,) 1000 MCG/ML injection Inject 1 mL (1,000 mcg total) into the muscle every 30 (thirty) days. 1 mL 0  . cyclobenzaprine (FLEXERIL) 10 MG tablet Take 2 tablets (20 mg total) by mouth 2 (two) times daily. 80 tablet 0  . diclofenac sodium (VOLTAREN) 1 % GEL Apply 2 g topically 2 (two) times daily as needed (for knee pain). 100 g 0  . DULoxetine (CYMBALTA) 60 MG capsule Take 1 capsule (60 mg total) by mouth daily. 30 capsule 0  . eszopiclone (LUNESTA) 2 MG TABS tablet Take 1 tablet (2 mg total) by mouth at bedtime. 20 tablet 0  . FERREX 150 150 MG capsule Take 150 mg by mouth 2 (two) times daily.    . furosemide (LASIX) 20 MG tablet Take 1 tablet (20 mg total) by mouth daily as needed (swelling). 30 tablet 11  . levETIRAcetam (KEPPRA) 500 MG tablet Take 1 tablet (500 mg total) by mouth 2 (two) times daily. 60 tablet 0  . levothyroxine (SYNTHROID) 200 MCG tablet TAKE 1 TABLET BY MOUTH EVERY DAY BEFORE BREAKFAST 90 tablet 3  . LYRICA 200 MG capsule Take 1 capsule (200 mg total) by mouth 2 (two) times daily. 40 capsule 0  .  magnesium oxide (MAG-OX) 400 MG tablet Take 1 tablet (400 mg total) by mouth daily. 30 tablet 0  . metoprolol succinate (TOPROL-XL) 25 MG 24 hr tablet TAKE 1 TABLET BY MOUTH IN AM AND TAKE YOUR 50 MG TABLETS IN THE EVENING TAKE WITH FOOD 90 tablet 3  . metoprolol succinate (TOPROL-XL) 50 MG 24 hr tablet TAKE ONE TABLET BY MOUTH IN THE EVENING Take with or immediately following a meal. TAKE 25 MG TABLETS IN THE MORNING 90 tablet 3  . mirabegron ER (MYRBETRIQ) 50 MG TB24 tablet Take 1 tablet (50 mg total) by mouth daily. 30 tablet 0  . NONFORMULARY OR COMPOUNDED ITEM [facility]:  Antifungal Cream - Terbinafine 3%, Fluconazole 2%, Tea Tree Oil 5%, Urea 10%, Ibuprofen 2% in DMSO Suspension #45m. Apply to affected toenail(s) once daily (at bedtime) or twice daily. 30 each 11  . senna (SENOKOT) 8.6 MG TABS tablet Take 1 tablet (8.6 mg total) by mouth daily. 120 each 0   . traMADol (ULTRAM) 50 MG tablet Take 50 mg by mouth every 6 (six) hours as needed for moderate pain.     . traZODone (DESYREL) 50 MG tablet Take 1 tablet (50 mg total) by mouth at bedtime. 30 tablet 0  . Vitamin D, Ergocalciferol, (DRISDOL) 1.25 MG (50000 UT) CAPS capsule Take 1 capsule (50,000 Units total) by mouth every Wednesday. 4 capsule 0   No current facility-administered medications on file prior to visit.    Allergies  Allergen Reactions  . Norco [Hydrocodone-Acetaminophen] Itching   Family History  Problem Relation Age of Onset  . Diabetes Mother   . Epilepsy Mother   . Cancer Mother        Breast  . Hypertension Mother   . Breast cancer Mother   . Seizures Mother   . Kidney disease Father   . Diabetes Father   . Hypertension Father   . Asthma Father   . Heart disease Father   . Epilepsy Sister   . Cancer Maternal Grandmother   . Breast cancer Maternal Grandmother   . Cancer Paternal Grandmother   . Breast cancer Paternal Grandmother     PE: BP 126/70   Pulse 85   Ht 5' 5"  (1.651 m)   SpO2 98%   BMI 45.97 kg/m  Wt Readings from Last 3 Encounters:  05/30/18 276 lb 3.8 oz (125.3 kg)  05/25/18 291 lb 0.1 oz (132 kg)  04/05/18 (!) 304 lb (137.9 kg)   Constitutional:  Obese, in NAD Eyes: PERRLA, EOMI, no exophthalmos ENT: moist mucous membranes, no thyromegaly, no cervical lymphadenopathy Cardiovascular: Irregularly irregular rhythm, regular rate, No MRG, B lymphedema Respiratory: CTA B Gastrointestinal: abdomen soft, NT, ND, BS+ Musculoskeletal: no deformities, strength intact in all 4 Skin: moist, warm, no rashes Neurological: no tremor with outstretched hands, DTR normal in all 4, [place] sign negative  Assessment: 1. Papillary thyroid cancer  2. Postsurgical hypothyroidism  3.  Postsurgical  Hypocalcemia  4.  Osteoporosis  PLAN: 1. PTC  Patient had an incidentally found focus of micro papillary thyroid cancer, which was discovered after her  thyroidectomy for enlarging nontoxic multinodular goiter.  Since the cancer was so small, this was most likely cured by her thyroidectomy. -No imaging follow-up is necessary, we will continue to follow her clinically -No neck compression symptoms.  She has chronic dysphagia.  2. Patient with hypothyroidism, developed after her thyroidectomy, on levothyroxine, with poor control - latest thyroid labs reviewed with pt >> normal 2 months ago -  she continues on LT4 200 mcg daily - pt feels good on this dose. - we discussed about taking the thyroid hormone every day, with water, >30 minutes before breakfast, separated by >4 hours from acid reflux medications, calcium, iron, multivitamins. Pt. is taking it correctly.  3. Patient with postoperative, iatrogenic, hypocalcemia-uncontrolled -Last year, she had a very low blood calcium of 5.5.  Vitamin D level was normal.  She does have a history of magnesium deficiency and high phosphorus.  At that time, we started calcitriol 0.5 mcg twice a day.  Her calcium level improved and her magnesium and phosphorus normalized.  At last check, 2 months ago, by PCP, her calcium was only slightly low, at 8.62, with a lower limit of normal being 8.7.  We discussed that this is at our target.  At that time, vitamin D level was normal.  She continues on 50,000 units ergocalciferol weekly. -No apparent signs or symptoms of hypocalcemia at today's visit: No perioral numbness, no acral cramping -We will continue her calcium, vitamin D, and calcitriol supplementation for now. -At last visit, we discussed we did discuss about the fact that Natpara has been recalled recently due to contaminants -I will see her back in 6 months  4. Osteoporosis -Reviewed latest DXA scan report with the patient: Osteoporosis -We started Prolia and she had the first dose 6 months ago.  She is tolerating this well, without side effects -She will get another injection in 1 week -Plan to continue for  at least 6 years.  Plan to check DEXA scan approximately 2 months into her Prolia treatment.  Discussed that Stable or increasing bone density scores are desirable, but ultimately, the best indication that the treatment is working is for her not having fractures.  Patient Instructions  Please continue: - CaCO3 1500 mg 2x a day with meals - high-dose vitamin D 50,000 U weekly - calcitriol 0.5 mcg 2x a day  Please continue levothyroxine 200 mcg daily.  Take the thyroid hormone every day, with water, at least 30 minutes before breakfast, separated by at least 4 hours from: - acid reflux medications - calcium - iron - multivitamins  Please give [place] a call about Prolia after 1 week.  Please come back for a follow-up appointment in 6 months.    Philemon Kingdom, MD PhD [facility]

## 2018-07-08 NOTE — Patient Instructions (Signed)
Please continue: - CaCO3 1500 mg 2x a day with meals - high-dose vitamin D 50,000 U weekly - calcitriol 0.5 mcg 2x a day  Please continue levothyroxine 200 mcg daily.  Take the thyroid hormone every day, with water, at least 30 minutes before breakfast, separated by at least 4 hours from: - acid reflux medications - calcium - iron - multivitamins  Please give [place] a call about Prolia after 1 week.  Please come back for a follow-up appointment in 6 months.

## 2018-07-11 ENCOUNTER — Telehealth: Payer: Self-pay | Admitting: Internal Medicine

## 2018-07-11 ENCOUNTER — Ambulatory Visit: Payer: Medicare Other

## 2018-07-11 NOTE — Telephone Encounter (Signed)
Patient arrived 07/11/2018 at 1:57 PM for a Prolia shot curbside.  Due to weather conditions and staff restrictions we were not able to do these outside.  Patient was advise that we were not able to come outside and was very displeased with this response.  After multiple attempts to suggest alternatives she disconnected call.

## 2018-07-12 NOTE — Telephone Encounter (Signed)
Patient is rescheduled for 07/16/18 and is aware she will have to come in for regular nurse visit

## 2018-07-16 ENCOUNTER — Ambulatory Visit (INDEPENDENT_AMBULATORY_CARE_PROVIDER_SITE_OTHER): Payer: Medicare Other

## 2018-07-16 ENCOUNTER — Other Ambulatory Visit: Payer: Self-pay

## 2018-07-16 DIAGNOSIS — M810 Age-related osteoporosis without current pathological fracture: Secondary | ICD-10-CM

## 2018-07-16 MED ORDER — DENOSUMAB 60 MG/ML ~~LOC~~ SOSY
60.0000 mg | PREFILLED_SYRINGE | Freq: Once | SUBCUTANEOUS | Status: AC
  Administered 2018-07-16: 10:00:00 60 mg via SUBCUTANEOUS

## 2018-07-16 NOTE — Progress Notes (Signed)
Per orders of Dr. Gherghe injection of Prolia given today by L.  CMA . Patient tolerated injection well. 

## 2018-07-17 DIAGNOSIS — J45909 Unspecified asthma, uncomplicated: Secondary | ICD-10-CM | POA: Diagnosis not present

## 2018-07-17 DIAGNOSIS — G40909 Epilepsy, unspecified, not intractable, without status epilepticus: Secondary | ICD-10-CM | POA: Diagnosis not present

## 2018-07-17 DIAGNOSIS — I5040 Unspecified combined systolic (congestive) and diastolic (congestive) heart failure: Secondary | ICD-10-CM | POA: Diagnosis not present

## 2018-07-17 DIAGNOSIS — J9610 Chronic respiratory failure, unspecified whether with hypoxia or hypercapnia: Secondary | ICD-10-CM | POA: Diagnosis not present

## 2018-07-17 DIAGNOSIS — I480 Paroxysmal atrial fibrillation: Secondary | ICD-10-CM | POA: Diagnosis not present

## 2018-07-17 DIAGNOSIS — I110 Hypertensive heart disease with heart failure: Secondary | ICD-10-CM | POA: Diagnosis not present

## 2018-07-18 DIAGNOSIS — R5383 Other fatigue: Secondary | ICD-10-CM | POA: Diagnosis not present

## 2018-07-21 ENCOUNTER — Other Ambulatory Visit: Payer: Self-pay | Admitting: Internal Medicine

## 2018-07-21 DIAGNOSIS — I4819 Other persistent atrial fibrillation: Secondary | ICD-10-CM

## 2018-07-22 ENCOUNTER — Other Ambulatory Visit: Payer: Self-pay | Admitting: Neurology

## 2018-07-24 DIAGNOSIS — R5383 Other fatigue: Secondary | ICD-10-CM | POA: Diagnosis not present

## 2018-07-31 ENCOUNTER — Encounter (HOSPITAL_COMMUNITY): Payer: Self-pay | Admitting: Emergency Medicine

## 2018-07-31 ENCOUNTER — Emergency Department (HOSPITAL_COMMUNITY): Payer: Medicare Other

## 2018-07-31 ENCOUNTER — Inpatient Hospital Stay (HOSPITAL_COMMUNITY): Payer: Medicare Other

## 2018-07-31 ENCOUNTER — Inpatient Hospital Stay (HOSPITAL_COMMUNITY)
Admission: EM | Admit: 2018-07-31 | Discharge: 2018-08-05 | DRG: 389 | Disposition: A | Discharge status: Home / Self Care | Payer: Medicare Other | Attending: Internal Medicine | Admitting: Internal Medicine

## 2018-07-31 ENCOUNTER — Other Ambulatory Visit: Payer: Self-pay

## 2018-07-31 ENCOUNTER — Telehealth: Payer: Self-pay

## 2018-07-31 DIAGNOSIS — Z4659 Encounter for fitting and adjustment of other gastrointestinal appliance and device: Secondary | ICD-10-CM

## 2018-07-31 DIAGNOSIS — K219 Gastro-esophageal reflux disease without esophagitis: Secondary | ICD-10-CM | POA: Diagnosis present

## 2018-07-31 DIAGNOSIS — Z20828 Contact with and (suspected) exposure to other viral communicable diseases: Secondary | ICD-10-CM | POA: Diagnosis present

## 2018-07-31 DIAGNOSIS — I272 Pulmonary hypertension, unspecified: Secondary | ICD-10-CM | POA: Diagnosis present

## 2018-07-31 DIAGNOSIS — N200 Calculus of kidney: Secondary | ICD-10-CM | POA: Diagnosis present

## 2018-07-31 DIAGNOSIS — Z03818 Encounter for observation for suspected exposure to other biological agents ruled out: Secondary | ICD-10-CM | POA: Diagnosis not present

## 2018-07-31 DIAGNOSIS — M810 Age-related osteoporosis without current pathological fracture: Secondary | ICD-10-CM | POA: Diagnosis present

## 2018-07-31 DIAGNOSIS — E669 Obesity, unspecified: Secondary | ICD-10-CM | POA: Diagnosis present

## 2018-07-31 DIAGNOSIS — Z79899 Other long term (current) drug therapy: Secondary | ICD-10-CM

## 2018-07-31 DIAGNOSIS — E049 Nontoxic goiter, unspecified: Secondary | ICD-10-CM | POA: Diagnosis present

## 2018-07-31 DIAGNOSIS — T508X5A Adverse effect of diagnostic agents, initial encounter: Secondary | ICD-10-CM | POA: Diagnosis not present

## 2018-07-31 DIAGNOSIS — G894 Chronic pain syndrome: Secondary | ICD-10-CM | POA: Diagnosis present

## 2018-07-31 DIAGNOSIS — R1084 Generalized abdominal pain: Secondary | ICD-10-CM | POA: Diagnosis not present

## 2018-07-31 DIAGNOSIS — Z9884 Bariatric surgery status: Secondary | ICD-10-CM

## 2018-07-31 DIAGNOSIS — N183 Chronic kidney disease, stage 3 unspecified: Secondary | ICD-10-CM | POA: Diagnosis present

## 2018-07-31 DIAGNOSIS — D631 Anemia in chronic kidney disease: Secondary | ICD-10-CM | POA: Diagnosis present

## 2018-07-31 DIAGNOSIS — N390 Urinary tract infection, site not specified: Secondary | ICD-10-CM | POA: Diagnosis not present

## 2018-07-31 DIAGNOSIS — Z96611 Presence of right artificial shoulder joint: Secondary | ICD-10-CM | POA: Diagnosis present

## 2018-07-31 DIAGNOSIS — N179 Acute kidney failure, unspecified: Secondary | ICD-10-CM | POA: Diagnosis present

## 2018-07-31 DIAGNOSIS — E890 Postprocedural hypothyroidism: Secondary | ICD-10-CM | POA: Diagnosis present

## 2018-07-31 DIAGNOSIS — G4733 Obstructive sleep apnea (adult) (pediatric): Secondary | ICD-10-CM | POA: Diagnosis present

## 2018-07-31 DIAGNOSIS — R110 Nausea: Secondary | ICD-10-CM | POA: Diagnosis not present

## 2018-07-31 DIAGNOSIS — I482 Chronic atrial fibrillation, unspecified: Secondary | ICD-10-CM | POA: Diagnosis present

## 2018-07-31 DIAGNOSIS — M159 Polyosteoarthritis, unspecified: Secondary | ICD-10-CM | POA: Insufficient documentation

## 2018-07-31 DIAGNOSIS — I5032 Chronic diastolic (congestive) heart failure: Secondary | ICD-10-CM | POA: Diagnosis present

## 2018-07-31 DIAGNOSIS — Z978 Presence of other specified devices: Secondary | ICD-10-CM | POA: Diagnosis not present

## 2018-07-31 DIAGNOSIS — R52 Pain, unspecified: Secondary | ICD-10-CM | POA: Diagnosis not present

## 2018-07-31 DIAGNOSIS — G4700 Insomnia, unspecified: Secondary | ICD-10-CM | POA: Diagnosis present

## 2018-07-31 DIAGNOSIS — R0602 Shortness of breath: Secondary | ICD-10-CM | POA: Diagnosis not present

## 2018-07-31 DIAGNOSIS — I130 Hypertensive heart and chronic kidney disease with heart failure and stage 1 through stage 4 chronic kidney disease, or unspecified chronic kidney disease: Secondary | ICD-10-CM | POA: Diagnosis not present

## 2018-07-31 DIAGNOSIS — R569 Unspecified convulsions: Secondary | ICD-10-CM | POA: Diagnosis present

## 2018-07-31 DIAGNOSIS — R0600 Dyspnea, unspecified: Secondary | ICD-10-CM

## 2018-07-31 DIAGNOSIS — Z9981 Dependence on supplemental oxygen: Secondary | ICD-10-CM

## 2018-07-31 DIAGNOSIS — K56609 Unspecified intestinal obstruction, unspecified as to partial versus complete obstruction: Secondary | ICD-10-CM

## 2018-07-31 DIAGNOSIS — Z6841 Body Mass Index (BMI) 40.0 and over, adult: Secondary | ICD-10-CM | POA: Diagnosis not present

## 2018-07-31 DIAGNOSIS — K56699 Other intestinal obstruction unspecified as to partial versus complete obstruction: Secondary | ICD-10-CM | POA: Diagnosis not present

## 2018-07-31 DIAGNOSIS — I480 Paroxysmal atrial fibrillation: Secondary | ICD-10-CM | POA: Diagnosis present

## 2018-07-31 DIAGNOSIS — R112 Nausea with vomiting, unspecified: Secondary | ICD-10-CM | POA: Diagnosis not present

## 2018-07-31 DIAGNOSIS — Z9049 Acquired absence of other specified parts of digestive tract: Secondary | ICD-10-CM

## 2018-07-31 DIAGNOSIS — I5042 Chronic combined systolic (congestive) and diastolic (congestive) heart failure: Secondary | ICD-10-CM | POA: Diagnosis present

## 2018-07-31 DIAGNOSIS — G40909 Epilepsy, unspecified, not intractable, without status epilepticus: Secondary | ICD-10-CM

## 2018-07-31 DIAGNOSIS — Z79890 Hormone replacement therapy (postmenopausal): Secondary | ICD-10-CM

## 2018-07-31 DIAGNOSIS — Z0189 Encounter for other specified special examinations: Secondary | ICD-10-CM

## 2018-07-31 DIAGNOSIS — Z79891 Long term (current) use of opiate analgesic: Secondary | ICD-10-CM

## 2018-07-31 DIAGNOSIS — K565 Intestinal adhesions [bands], unspecified as to partial versus complete obstruction: Principal | ICD-10-CM | POA: Diagnosis present

## 2018-07-31 DIAGNOSIS — Z8249 Family history of ischemic heart disease and other diseases of the circulatory system: Secondary | ICD-10-CM

## 2018-07-31 DIAGNOSIS — I428 Other cardiomyopathies: Secondary | ICD-10-CM | POA: Diagnosis present

## 2018-07-31 DIAGNOSIS — Z85850 Personal history of malignant neoplasm of thyroid: Secondary | ICD-10-CM

## 2018-07-31 DIAGNOSIS — Z885 Allergy status to narcotic agent status: Secondary | ICD-10-CM

## 2018-07-31 DIAGNOSIS — Z87442 Personal history of urinary calculi: Secondary | ICD-10-CM

## 2018-07-31 DIAGNOSIS — Z841 Family history of disorders of kidney and ureter: Secondary | ICD-10-CM

## 2018-07-31 DIAGNOSIS — Z7902 Long term (current) use of antithrombotics/antiplatelets: Secondary | ICD-10-CM

## 2018-07-31 DIAGNOSIS — M797 Fibromyalgia: Secondary | ICD-10-CM | POA: Diagnosis present

## 2018-07-31 DIAGNOSIS — K5669 Other partial intestinal obstruction: Secondary | ICD-10-CM | POA: Diagnosis not present

## 2018-07-31 DIAGNOSIS — Z7901 Long term (current) use of anticoagulants: Secondary | ICD-10-CM

## 2018-07-31 DIAGNOSIS — I4891 Unspecified atrial fibrillation: Secondary | ICD-10-CM | POA: Diagnosis present

## 2018-07-31 LAB — CBC WITH DIFFERENTIAL/PLATELET
Abs Immature Granulocytes: 0.02 10*3/uL (ref 0.00–0.07)
Basophils Absolute: 0 10*3/uL (ref 0.0–0.1)
Basophils Relative: 1 %
Eosinophils Absolute: 0.2 10*3/uL (ref 0.0–0.5)
Eosinophils Relative: 2 %
HCT: 44.8 % (ref 36.0–46.0)
Hemoglobin: 14.2 g/dL (ref 12.0–15.0)
Immature Granulocytes: 0 %
Lymphocytes Relative: 19 %
Lymphs Abs: 1.5 10*3/uL (ref 0.7–4.0)
MCH: 29.5 pg (ref 26.0–34.0)
MCHC: 31.7 g/dL (ref 30.0–36.0)
MCV: 92.9 fL (ref 80.0–100.0)
Monocytes Absolute: 0.5 10*3/uL (ref 0.1–1.0)
Monocytes Relative: 7 %
Neutro Abs: 5.3 10*3/uL (ref 1.7–7.7)
Neutrophils Relative %: 71 %
Platelets: 203 10*3/uL (ref 150–400)
RBC: 4.82 MIL/uL (ref 3.87–5.11)
RDW: 15.8 % — ABNORMAL HIGH (ref 11.5–15.5)
WBC: 7.5 10*3/uL (ref 4.0–10.5)
nRBC: 0 % (ref 0.0–0.2)

## 2018-07-31 LAB — URINALYSIS, ROUTINE W REFLEX MICROSCOPIC
Bilirubin Urine: NEGATIVE
Glucose, UA: NEGATIVE mg/dL
Ketones, ur: NEGATIVE mg/dL
Nitrite: POSITIVE — AB
Protein, ur: NEGATIVE mg/dL
Specific Gravity, Urine: 1.015 (ref 1.005–1.030)
pH: 5 (ref 5.0–8.0)

## 2018-07-31 LAB — COMPREHENSIVE METABOLIC PANEL
ALT: 12 U/L (ref 0–44)
AST: 25 U/L (ref 15–41)
Albumin: 4 g/dL (ref 3.5–5.0)
Alkaline Phosphatase: 83 U/L (ref 38–126)
Anion gap: 8 (ref 5–15)
BUN: 22 mg/dL (ref 8–23)
CO2: 25 mmol/L (ref 22–32)
Calcium: 8.2 mg/dL — ABNORMAL LOW (ref 8.9–10.3)
Chloride: 106 mmol/L (ref 98–111)
Creatinine, Ser: 0.87 mg/dL (ref 0.44–1.00)
GFR calc Af Amer: >60 mL/min (ref >60)
GFR calc non Af Amer: >60 mL/min (ref >60)
Glucose, Bld: 101 mg/dL — ABNORMAL HIGH (ref 70–99)
Potassium: 6.8 mmol/L (ref 3.5–5.1)
Sodium: 139 mmol/L (ref 135–145)
Total Bilirubin: 0.7 mg/dL (ref 0.3–1.2)
Total Protein: 8 g/dL (ref 6.5–8.1)

## 2018-07-31 LAB — HEPARIN LEVEL (UNFRACTIONATED)
Heparin Unfractionated: 1.14 IU/mL — ABNORMAL HIGH (ref 0.30–0.70)
Heparin Unfractionated: 1.18 IU/mL — ABNORMAL HIGH (ref 0.30–0.70)

## 2018-07-31 LAB — APTT
aPTT: 31 seconds (ref 24–36)
aPTT: 79 seconds — ABNORMAL HIGH (ref 24–36)

## 2018-07-31 LAB — SARS CORONAVIRUS 2 BY RT PCR (HOSPITAL ORDER, PERFORMED IN ~~LOC~~ HOSPITAL LAB): SARS Coronavirus 2: NEGATIVE

## 2018-07-31 LAB — POTASSIUM: Potassium: 4.4 mmol/L (ref 3.5–5.1)

## 2018-07-31 MED ORDER — HYDROMORPHONE HCL 2 MG/ML IJ SOLN
2.0000 mg | INTRAMUSCULAR | Status: DC | PRN
  Administered 2018-07-31: 2 mg via INTRAVENOUS
  Filled 2018-07-31: qty 1

## 2018-07-31 MED ORDER — ENALAPRILAT 1.25 MG/ML IV SOLN
0.6250 mg | Freq: Four times a day (QID) | INTRAVENOUS | Status: DC | PRN
  Filled 2018-07-31: qty 1

## 2018-07-31 MED ORDER — LEVETIRACETAM IN NACL 500 MG/100ML IV SOLN
500.0000 mg | Freq: Two times a day (BID) | INTRAVENOUS | Status: DC
  Administered 2018-07-31 – 2018-08-05 (×11): 500 mg via INTRAVENOUS
  Filled 2018-07-31 (×11): qty 100

## 2018-07-31 MED ORDER — GUAIFENESIN-DM 100-10 MG/5ML PO SYRP: 10.0000 mL | ORAL_SOLUTION | ORAL | Status: DC | PRN

## 2018-07-31 MED ORDER — HYDROCORTISONE (PERIANAL) 2.5 % EX CREA
1.0000 "application " | TOPICAL_CREAM | Freq: Four times a day (QID) | CUTANEOUS | Status: DC | PRN
  Filled 2018-07-31: qty 28.35

## 2018-07-31 MED ORDER — HYDROMORPHONE HCL 1 MG/ML IJ SOLN
2.0000 mg | INTRAMUSCULAR | Status: DC | PRN
  Administered 2018-07-31: 2 mg via INTRAVENOUS
  Filled 2018-07-31: qty 2

## 2018-07-31 MED ORDER — FENTANYL CITRATE (PF) 100 MCG/2ML IJ SOLN
100.0000 ug | Freq: Once | INTRAMUSCULAR | Status: AC
  Administered 2018-07-31: 100 ug via INTRAVENOUS
  Filled 2018-07-31: qty 2

## 2018-07-31 MED ORDER — ACETAMINOPHEN 650 MG RE SUPP: 650.0000 mg | Freq: Four times a day (QID) | RECTAL | Status: DC | PRN

## 2018-07-31 MED ORDER — ONDANSETRON HCL 4 MG/2ML IJ SOLN: 4.0000 mg | Freq: Four times a day (QID) | INTRAMUSCULAR | Status: DC | PRN

## 2018-07-31 MED ORDER — ONDANSETRON HCL 4 MG/2ML IJ SOLN: 4.0000 mg | Freq: Once | INTRAMUSCULAR | Status: DC

## 2018-07-31 MED ORDER — IOHEXOL 300 MG/ML  SOLN
100.0000 mL | Freq: Once | INTRAMUSCULAR | Status: AC | PRN
  Administered 2018-07-31: 100 mL via INTRAVENOUS

## 2018-07-31 MED ORDER — SODIUM CHLORIDE (PF) 0.9 % IJ SOLN
INTRAMUSCULAR | Status: AC
  Administered 2018-07-31: 05:00:00
  Filled 2018-07-31: qty 50

## 2018-07-31 MED ORDER — ALUM & MAG HYDROXIDE-SIMETH 200-200-20 MG/5ML PO SUSP: 30.0000 mL | Freq: Four times a day (QID) | ORAL | Status: DC | PRN

## 2018-07-31 MED ORDER — ACETAMINOPHEN 650 MG RE SUPP
650.0000 mg | Freq: Four times a day (QID) | RECTAL | Status: DC | PRN
  Administered 2018-08-03: 650 mg via RECTAL
  Filled 2018-07-31 (×2): qty 1

## 2018-07-31 MED ORDER — LACTATED RINGERS IV BOLUS: 1000.0000 mL | Freq: Three times a day (TID) | INTRAVENOUS | Status: AC | PRN

## 2018-07-31 MED ORDER — LEVOTHYROXINE SODIUM 100 MCG/5ML IV SOLN
100.0000 ug | Freq: Every day | INTRAVENOUS | Status: DC
  Administered 2018-07-31 – 2018-08-04 (×5): 100 ug via INTRAVENOUS
  Filled 2018-07-31 (×5): qty 5

## 2018-07-31 MED ORDER — BISACODYL 10 MG RE SUPP
10.0000 mg | Freq: Every day | RECTAL | Status: DC
  Administered 2018-08-01 – 2018-08-03 (×3): 10 mg via RECTAL
  Filled 2018-07-31 (×5): qty 1

## 2018-07-31 MED ORDER — ONDANSETRON HCL 4 MG PO TABS: 4.0000 mg | ORAL_TABLET | Freq: Four times a day (QID) | ORAL | Status: DC | PRN

## 2018-07-31 MED ORDER — SODIUM CHLORIDE 0.9 % IV SOLN
8.0000 mg | Freq: Four times a day (QID) | INTRAVENOUS | Status: DC | PRN
  Filled 2018-07-31: qty 4

## 2018-07-31 MED ORDER — PHENOL 1.4 % MT LIQD
1.0000 | OROMUCOSAL | Status: DC | PRN
  Filled 2018-07-31: qty 177

## 2018-07-31 MED ORDER — MENTHOL 3 MG MT LOZG
1.0000 | LOZENGE | OROMUCOSAL | Status: DC | PRN
  Filled 2018-07-31: qty 9

## 2018-07-31 MED ORDER — ONDANSETRON HCL 4 MG/2ML IJ SOLN
4.0000 mg | Freq: Four times a day (QID) | INTRAMUSCULAR | Status: DC | PRN
  Administered 2018-08-01 – 2018-08-02 (×3): 4 mg via INTRAVENOUS
  Filled 2018-07-31 (×4): qty 2

## 2018-07-31 MED ORDER — LACTATED RINGERS IV BOLUS
1000.0000 mL | Freq: Once | INTRAVENOUS | Status: AC
  Administered 2018-07-31: 1000 mL via INTRAVENOUS

## 2018-07-31 MED ORDER — HYDROMORPHONE HCL 1 MG/ML IJ SOLN
1.0000 mg | INTRAMUSCULAR | Status: DC | PRN
  Administered 2018-08-01 – 2018-08-02 (×5): 1 mg via INTRAVENOUS
  Filled 2018-07-31 (×5): qty 1

## 2018-07-31 MED ORDER — LIP MEDEX EX OINT
1.0000 "application " | TOPICAL_OINTMENT | Freq: Two times a day (BID) | CUTANEOUS | Status: DC
  Administered 2018-07-31 – 2018-08-05 (×8): 1 via TOPICAL
  Filled 2018-07-31 (×3): qty 7

## 2018-07-31 MED ORDER — ACETAMINOPHEN 325 MG PO TABS: 650.0000 mg | ORAL_TABLET | Freq: Four times a day (QID) | ORAL | Status: DC | PRN

## 2018-07-31 MED ORDER — HYDROCORTISONE 1 % EX CREA
1.0000 "application " | TOPICAL_CREAM | Freq: Three times a day (TID) | CUTANEOUS | Status: DC | PRN
  Filled 2018-07-31: qty 28

## 2018-07-31 MED ORDER — METOPROLOL TARTRATE 5 MG/5ML IV SOLN: 5.0000 mg | Freq: Four times a day (QID) | INTRAVENOUS | Status: DC | PRN

## 2018-07-31 MED ORDER — FENTANYL CITRATE (PF) 100 MCG/2ML IJ SOLN
100.0000 ug | INTRAMUSCULAR | Status: DC | PRN
  Administered 2018-07-31: 100 ug via INTRAVENOUS
  Filled 2018-07-31 (×2): qty 2

## 2018-07-31 MED ORDER — DIATRIZOATE MEGLUMINE & SODIUM 66-10 % PO SOLN
90.0000 mL | Freq: Once | ORAL | Status: AC
  Administered 2018-07-31: 90 mL via NASOGASTRIC
  Filled 2018-07-31: qty 90

## 2018-07-31 MED ORDER — HYDRALAZINE HCL 20 MG/ML IJ SOLN: 5.0000 mg | INTRAMUSCULAR | Status: DC | PRN

## 2018-07-31 MED ORDER — FENTANYL CITRATE (PF) 100 MCG/2ML IJ SOLN
50.0000 ug | Freq: Once | INTRAMUSCULAR | Status: AC
  Administered 2018-07-31: 50 ug via INTRAVENOUS
  Filled 2018-07-31: qty 2

## 2018-07-31 MED ORDER — MAGIC MOUTHWASH
15.0000 mL | Freq: Four times a day (QID) | ORAL | Status: DC | PRN
  Filled 2018-07-31: qty 15

## 2018-07-31 MED ORDER — DIPHENHYDRAMINE HCL 50 MG/ML IJ SOLN
12.5000 mg | Freq: Four times a day (QID) | INTRAMUSCULAR | Status: DC | PRN
  Administered 2018-07-31 – 2018-08-02 (×6): 12.5 mg via INTRAVENOUS
  Filled 2018-07-31 (×6): qty 1

## 2018-07-31 MED ORDER — HEPARIN (PORCINE) 25000 UT/250ML-% IV SOLN
1250.0000 [IU]/h | INTRAVENOUS | Status: DC
  Administered 2018-07-31 – 2018-08-01 (×2): 1250 [IU]/h via INTRAVENOUS
  Filled 2018-07-31 (×2): qty 250

## 2018-07-31 MED ORDER — METOPROLOL TARTRATE 5 MG/5ML IV SOLN
5.0000 mg | Freq: Four times a day (QID) | INTRAVENOUS | Status: DC
  Administered 2018-07-31 – 2018-08-03 (×12): 5 mg via INTRAVENOUS
  Filled 2018-07-31 (×13): qty 5

## 2018-07-31 MED ORDER — ONDANSETRON HCL 4 MG/2ML IJ SOLN
4.0000 mg | Freq: Once | INTRAMUSCULAR | Status: AC
  Administered 2018-07-31: 4 mg via INTRAVENOUS
  Filled 2018-07-31: qty 2

## 2018-07-31 MED ORDER — NALOXONE HCL 0.4 MG/ML IJ SOLN: 0.4000 mg | Freq: Once | INTRAMUSCULAR | Status: DC

## 2018-07-31 MED ORDER — SODIUM CHLORIDE 0.9 % IV SOLN
INTRAVENOUS | Status: DC
  Administered 2018-07-31 – 2018-08-04 (×6): via INTRAVENOUS

## 2018-07-31 NOTE — Consult Note (Addendum)
[facility] Surgery Consult Note  Jillian Eaton [facility] 08-15-1950  [id number].    Requesting MD: April Palumbo  Chief Complaint:  Abdominal pain Reason for Consult: Recurrent SBO  HPI: She is a 68 year old female who presents with acute onset of abdominal pain then followed by nausea and vomiting.  This started last evening around 6 PM.  Pain became progressively worse and she presented to the ED early this morning via EMS complaining of left lower quadrant abdominal pain.  She did note her last bowel movement was about 24 hours ago. She has prior history of gastroplasty 1992, followed by some form of revision again in 1993.  These procedures were in [facility].  In the 90's she had at least 2 small bowel obstructions that required surgical intervention.  She later had a third small bowel obstruction that improved but apparently underwent some kind of exploratory laparotomy/laparoscopy with lysis of adhesions.  She has not had any problems until yesterday since her surgery in the 13's.  She has a history of kidney stones and has had 2 surgeries for kidney stone removal.  She also had a cholecystectomy and a tubal ligation.  Work-up in the ED shows she is afebrile somewhat hypertensive.  With occasional tachycardia/bradycardia.  Labs show an original potassium of 6.8 and repeat was 4.4.  Calcium was 8.2 otherwise the CMP is normal.  WBC 7.5, hemoglobin 14.2, hematocrit 44.8, platelets 203,000.  Urinalysis was nitrite positive with WBC, RBCs and bacteria present on the urinalysis.  CT scan was obtained with contrast shows a small bowel obstruction with transition point in the right upper quadrant likely due to adhesions are related to a prior anastomosis.  There is no focal perforation.  There is some free fluid in the pelvis.  There is a 10 mm nonobstructing right renal calculus.  There is some moderate intra-and extrahepatic biliary dilatation without interval changes.  She has had a prior  cholecystectomy.  Past medical history significant for morbid obesity, chronic diastolic congestive heart failure, pulmonary hypertension, proximal atrial fibrillation on Eliquis.  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  She has a history of fibromyalgia, severe osteoarthritis, both knees and she walks with a walker.  She is followed by Dr. [name] for cardiology, Dr. Junious Silk for urology, Dr. Benson Norway for GI.  She has been on no weight loss program of her own for the last 2 years and is lost 150 pounds.  She was previously at 410 pounds.  She reports her current weight is about 260.  I saw Dr. Benson Norway in the ED.  He is going to look for records and forward any EGD information he may have.    ROS: Review of Systems  Constitutional: Negative.   HENT: Negative.   Eyes: Negative.   Respiratory: Positive for shortness of breath (DOE). Negative for cough, hemoptysis, sputum production and wheezing.   Cardiovascular: Positive for palpitations and leg swelling. Negative for chest pain, orthopnea, claudication and PND.  Gastrointestinal: Positive for abdominal pain, constipation (Occasional), nausea and vomiting. Negative for blood in stool, diarrhea and heartburn.  Genitourinary:       She has trouble with urinary incontinence, especially at night.  Musculoskeletal: Positive for joint pain (She has severe osteoarthritis and has significant trouble with her knees.  She uses a walker to ambulate.).  Skin: Negative.   Neurological: Positive for seizures (She had seizures in April.  She has a history of fibromyalgia is  on Lyrica.  She was off Lyrica for about a week prior to her seizures).  Endo/Heme/Allergies: Bruises/bleeds easily.  Psychiatric/Behavioral: The patient is nervous/anxious.     Family History  Problem Relation Age of Onset  . Diabetes Mother   . Epilepsy Mother   . Cancer Mother        Breast  .  Hypertension Mother   . Breast cancer Mother   . Seizures Mother   . Kidney disease Father   . Diabetes Father   . Hypertension Father   . Asthma Father   . Heart disease Father   . Epilepsy Sister   . Cancer Maternal Grandmother   . Breast cancer Maternal Grandmother   . Cancer Paternal Grandmother   . Breast cancer Paternal Grandmother     Past Medical History:  Diagnosis Date  . Anemia   . Asthma    flare up last yr  . CHF (congestive heart failure) ([place])   . Chronic headache   . DOE (dyspnea on exertion)    2D ECHO, 02/12/2012 - EF 60-65%, moderate concentric hypertrophy  . Fibromyalgia    nerve pain"left side at waist level" "can't lay on that side without pain" , "HOB elevation helps"  . Heart murmur   . Hematuria - cause not known   . History of kidney stones    x 2 '13, '14 surgery to remove  . Hypertension   . SBO (small bowel obstruction) ([place]) 06/07/2013  . Sleep apnea    tested several times and came up negative  . Thyroid disease    "goiter"  . Transfusion history    10 yrs+    Past Surgical History:  Procedure Laterality Date  . CARDIAC CATHETERIZATION  04/04/2010   No significant obstructive coronary artery disease  . CARDIOVERSION N/A 08/08/2017   Procedure: CARDIOVERSION;  Surgeon: Pixie Casino, MD;  Location: [place];  Service: Cardiovascular;  Laterality: N/A;  . CHOLECYSTECTOMY  1990  . COLONOSCOPY W/ POLYPECTOMY    . COLONOSCOPY WITH PROPOFOL N/A 04/10/2014   Procedure: COLONOSCOPY WITH PROPOFOL;  Surgeon: Beryle Beams, MD;  Location: [facility];  Service: Endoscopy;  Laterality: N/A;  . DIAGNOSTIC LAPAROSCOPY     x2 bowel obstructions(adhesions)  . EYE SURGERY     lasik 20-25 yrs ago  . 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 REVERSE SHOULDER ARTHROPLASTY Right 03/29/2018   Procedure: REVERSE SHOULDER ARTHROPLASTY;  Surgeon: Netta Cedars, MD;  Location: [place];  Service: Orthopedics;   Laterality: Right;  . THYROIDECTOMY    . TUBAL LIGATION  1986    Social History:  reports that she has never smoked. She has never used smokeless tobacco. She reports that she does not drink alcohol or use drugs.  Allergies:  Allergies  Allergen Reactions  . Norco [Hydrocodone-Acetaminophen] Itching    Prior to Admission medications   Medication Sig Start Date End Date Taking? Authorizing Provider  acetaminophen (TYLENOL) 650 MG CR tablet Take 1,300 mg by mouth 3 (three) times daily. At 6 am, 2 pm, and 10 pm   Yes [provider]  apixaban (ELIQUIS) 5 MG TABS tablet Take 1 tablet (5 mg total) by mouth 2 (two) times daily. 04/05/18  Yes Hennie Duos, MD  calcitRIOL (ROCALTROL) 0.5 MCG capsule TAKE 1 CAPSULE BY MOUTH TWICE A DAY Patient taking differently: Take 0.5 mcg by mouth 2 (two) times a day.  07/22/18  Yes Gherghe,  Salena Saner, MD  calcium carbonate (OS-CAL - DOSED IN MG OF ELEMENTAL CALCIUM) 1250 (500 Ca) MG tablet Take 2 tablets (1,000 mg of elemental calcium total) by mouth 2 (two) times daily. Patient taking differently: Take 2 tablets by mouth 2 (two) times daily with a meal.  04/05/18  Yes Hennie Duos, MD  clotrimazole (LOTRIMIN AF) 1 % cream Apply 1 application topically 2 (two) times daily. 05/21/18  Yes Marzetta Board, DPM  cyanocobalamin (,VITAMIN B-12,) 1000 MCG/ML injection Inject 1 mL (1,000 mcg total) into the muscle every 30 (thirty) days. 04/05/18  Yes Hennie Duos, MD  cyclobenzaprine (FLEXERIL) 10 MG tablet Take 2 tablets (20 mg total) by mouth 2 (two) times daily. 04/05/18  Yes Hennie Duos, MD  diclofenac sodium (VOLTAREN) 1 % GEL Apply 2 g topically 2 (two) times daily as needed (for knee pain). 04/05/18  Yes Hennie Duos, MD  DULoxetine (CYMBALTA) 60 MG capsule Take 1 capsule (60 mg total) by mouth daily. 04/05/18  Yes Hennie Duos, MD  eszopiclone (LUNESTA) 2 MG TABS tablet Take 1 tablet (2 mg total) by mouth at bedtime. 04/05/18   Yes Hennie Duos, MD  FERREX 150 150 MG capsule Take 150 mg by mouth 2 (two) times daily. 05/01/18  Yes [provider]  furosemide (LASIX) 20 MG tablet Take 1 tablet (20 mg total) by mouth daily as needed (swelling). 06/04/18 09/02/18 Yes Hilty, Nadean Corwin, MD  levETIRAcetam (KEPPRA) 500 MG tablet TAKE 1 TABLET BY MOUTH TWICE A DAY Patient taking differently: Take 500 mg by mouth 2 (two) times daily.  07/23/18  Yes Kathrynn Ducking, MD  levothyroxine (SYNTHROID) 200 MCG tablet TAKE 1 TABLET BY MOUTH EVERY DAY BEFORE BREAKFAST Patient taking differently: Take 200 mcg by mouth daily before breakfast.  06/14/18  Yes Hilty, Nadean Corwin, MD  magnesium oxide (MAG-OX) 400 MG tablet Take 1 tablet (400 mg total) by mouth daily. 04/05/18  Yes Hennie Duos, MD  metoprolol succinate (TOPROL-XL) 25 MG 24 hr tablet TAKE 1 TABLET BY MOUTH IN AM AND TAKE YOUR 50 MG TABLETS IN THE EVENING TAKE WITH FOOD Patient taking differently: Take 25 mg by mouth See admin instructions. TAKE 1 TABLET BY MOUTH IN AM AND TAKE YOUR 50 MG TABLETS IN THE EVENING TAKE WITH FOOD 06/27/18  Yes Hilty, Nadean Corwin, MD  metoprolol succinate (TOPROL-XL) 50 MG 24 hr tablet TAKE ONE TABLET BY MOUTH IN THE EVENING Take with or immediately following a meal. TAKE 25 MG TABLETS IN THE MORNING Patient taking differently: Take 50 mg by mouth See admin instructions. TAKE ONE TABLET BY MOUTH IN THE EVENING Take with or immediately following a meal. TAKE 25 MG TABLETS IN THE MORNING 06/27/18  Yes Hilty, Nadean Corwin, MD  mirabegron ER (MYRBETRIQ) 50 MG TB24 tablet Take 1 tablet (50 mg total) by mouth daily. 04/05/18  Yes Hennie Duos, MD  NONFORMULARY OR COMPOUNDED ITEM [facility]:  Antifungal Cream - Terbinafine 3%, Fluconazole 2%, Tea Tree Oil 5%, Urea 10%, Ibuprofen 2% in DMSO Suspension #38ml. Apply to affected toenail(s) once daily (at bedtime) or twice daily. 05/07/18  Yes Marzetta Board, DPM  senna (SENOKOT) 8.6 MG TABS tablet  Take 1 tablet (8.6 mg total) by mouth daily. 04/05/18  Yes Hennie Duos, MD  traMADol (ULTRAM) 50 MG tablet Take 50 mg by mouth every 6 (six) hours as needed for moderate pain.  04/30/18  Yes [provider]  traZODone ([place])  50 MG tablet Take 1 tablet (50 mg total) by mouth at bedtime. 04/05/18  Yes Hennie Duos, MD  Vitamin D, Ergocalciferol, (DRISDOL) 1.25 MG (50000 UT) CAPS capsule Take 1 capsule (50,000 Units total) by mouth every Wednesday. 04/10/18  Yes Hennie Duos, MD  LYRICA 200 MG capsule Take 1 capsule (200 mg total) by mouth 2 (two) times daily. Patient not taking: Reported on 07/31/2018 04/05/18   Hennie Duos, MD     Blood pressure 139/89, pulse 91, temperature 98 F (36.7 C), temperature source Oral, resp. rate 12, height 5\' 5"  (1.651 m), weight 117.9 kg, SpO2 99 %. Physical Exam: Physical Exam Constitutional:      General: She is not in acute distress.    Appearance: She is obese. She is ill-appearing. She is not toxic-appearing or diaphoretic.  HENT:     Head: Normocephalic and atraumatic.  Eyes:     General: No scleral icterus.    Comments: Pupils are equal  Cardiovascular:     Rate and Rhythm: Normal rate. Rhythm irregular.     Heart sounds: Normal heart sounds. No murmur.  Pulmonary:     Effort: Pulmonary effort is normal. No respiratory distress.     Breath sounds: Normal breath sounds. No stridor. No wheezing, rhonchi or rales.  Chest:     Chest wall: No tenderness.  Abdominal:     General: Abdomen is flat. Bowel sounds are absent.     Tenderness: There is abdominal tenderness in the right upper quadrant, epigastric area and left upper quadrant. There is no right CVA tenderness, left CVA tenderness, guarding or rebound.     Hernia: No hernia is present.     Comments: Large midline surgical scar.  Skin:    General: Skin is warm and dry.  Neurological:     General: No focal deficit present.     Mental Status: She is alert and  oriented to person, place, and time.     Cranial Nerves: No cranial nerve deficit.     Motor: No weakness.  Psychiatric:        Mood and Affect: Mood normal. Mood is not anxious or depressed.        Behavior: Behavior normal.     Results for orders placed or performed during the hospital encounter of 07/31/18 (from the past 48 hour(s))  CBC with Differential/Platelet     Status: Abnormal   Collection Time: 07/31/18  3:34 AM  Result Value Ref Range   WBC 7.5 4.0 - 10.5 K/uL   RBC 4.82 3.87 - 5.11 MIL/uL   Hemoglobin 14.2 12.0 - 15.0 g/dL   HCT 44.8 36.0 - 46.0 %   MCV 92.9 80.0 - 100.0 fL   MCH 29.5 26.0 - 34.0 pg   MCHC 31.7 30.0 - 36.0 g/dL   RDW 15.8 (H) 11.5 - 15.5 %   Platelets 203 150 - 400 K/uL   nRBC 0.0 0.0 - 0.2 %   Neutrophils Relative % 71 %   Neutro Abs 5.3 1.7 - 7.7 K/uL   Lymphocytes Relative 19 %   Lymphs Abs 1.5 0.7 - 4.0 K/uL   Monocytes Relative 7 %   Monocytes Absolute 0.5 0.1 - 1.0 K/uL   Eosinophils Relative 2 %   Eosinophils Absolute 0.2 0.0 - 0.5 K/uL   Basophils Relative 1 %   Basophils Absolute 0.0 0.0 - 0.1 K/uL   Immature Granulocytes 0 %   Abs Immature Granulocytes 0.02 0.00 - 0.07 K/uL  Comment: Performed at [facility], [place] [address], [place], [place] [address]  Comprehensive metabolic panel     Status: Abnormal   Collection Time: 07/31/18  3:34 AM  Result Value Ref Range   Sodium 139 135 - 145 mmol/L   Potassium 6.8 (HH) 3.5 - 5.1 mmol/L    Comment: SLIGHT HEMOLYSIS CRITICAL RESULT CALLED TO, READ BACK BY AND VERIFIED WITH: T HAMLETT RN [id number] 07/31/18 A NAVARRO    Chloride 106 98 - 111 mmol/L   CO2 25 22 - 32 mmol/L   Glucose, Bld 101 (H) 70 - 99 mg/dL   BUN 22 8 - 23 mg/dL   Creatinine, Ser 0.87 0.44 - 1.00 mg/dL   Calcium 8.2 (L) 8.9 - 10.3 mg/dL   Total Protein 8.0 6.5 - 8.1 g/dL   Albumin 4.0 3.5 - 5.0 g/dL   AST 25 15 - 41 U/L   ALT 12 0 - 44 U/L   Alkaline Phosphatase 83 38 - 126 U/L   Total Bilirubin  0.7 0.3 - 1.2 mg/dL   GFR calc non Af Amer >60 >60 mL/min   GFR calc Af Amer >60 >60 mL/min   Anion gap 8 5 - 15    Comment: Performed at [facility], [place] [address], [place], [place] [address]  Potassium     Status: None   Collection Time: 07/31/18  5:21 AM  Result Value Ref Range   Potassium 4.4 3.5 - 5.1 mmol/L    Comment: DELTA CHECK NOTED REPEATED TO VERIFY Performed at [place] [address], [place], [place] [address]   Urinalysis, Routine w reflex microscopic     Status: Abnormal   Collection Time: 07/31/18  6:57 AM  Result Value Ref Range   Color, Urine YELLOW YELLOW   APPearance HAZY (A) CLEAR   Specific Gravity, Urine 1.015 1.005 - 1.030   pH 5.0 5.0 - 8.0   Glucose, UA NEGATIVE NEGATIVE mg/dL   Hgb urine dipstick MODERATE (A) NEGATIVE   Bilirubin Urine NEGATIVE NEGATIVE   Ketones, ur NEGATIVE NEGATIVE mg/dL   Protein, ur NEGATIVE NEGATIVE mg/dL   Nitrite POSITIVE (A) NEGATIVE   Leukocytes,Ua SMALL (A) NEGATIVE   RBC / HPF 21-50 0 - 5 RBC/hpf   WBC, UA 6-10 0 - 5 WBC/hpf   Bacteria, UA MANY (A) NONE SEEN   Squamous Epithelial / LPF 0-5 0 - 5   Mucus PRESENT     Comment: Performed at [facility], [place] [address], [place], [place] [address]   Ct Abdomen Pelvis W Contrast  Result Date: 07/31/2018 CLINICAL DATA:  Left lower quadrant abdominal pain beginning at 6 o'clock p.m. Previous small-bowel obstructions. EXAM: CT ABDOMEN AND PELVIS WITH CONTRAST TECHNIQUE: Multidetector CT imaging of the abdomen and pelvis was performed using the standard protocol following bolus administration of intravenous contrast. CONTRAST:  155mL OMNIPAQUE IOHEXOL 300 MG/ML  SOLN COMPARISON:  CT of the abdomen and pelvis 06/05/2017 at [facility]. CT of the abdomen and pelvis 10/03/2013 at [place]. FINDINGS: Lower chest: Heart is enlarged, particularly the right atrium. This is stable. No  significant pleural or pericardial effusion is present. Mild dependent atelectasis is present at both lung bases. Hepatobiliary: Moderate intra and extrahepatic biliary dilation is present. The patient is status post cholecystectomy. No obstructing lesion is present. No discrete hepatic lesions are present. Pancreas: Unremarkable. No pancreatic ductal dilatation or surrounding inflammatory changes. Spleen: Normal in size  without focal abnormality. Adrenals/Urinary Tract: The adrenal glands are normal bilaterally. A 10 mm nonobstructing stone is present at the lower pole of the right kidney. Additional punctate nonobstructing stone is present at the upper pole of the right kidney. No other mass lesion is present. There is no obstruction. Ureters are normal. The urinary bladder is decompressed. 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 Small bowel is diffusely dilated. There is marked dilation in the right upper quadrant at the level of a previous anastomosis, just proximal to the colon. Colon is decompressed. Appendix is visualized and normal. Vascular/Lymphatic: No significant vascular calcifications or adenopathy is present Reproductive: Calcified fibroid uterus is again noted. Adnexa are unremarkable. Other: Minimal free fluid is present within the anatomic pelvis. Musculoskeletal: Stable grade 1 anterolisthesis is present at L4-5. Vertebral body heights and alignment otherwise normal. Mild leftward curvature is present. Bony pelvis is intact. Hips are located and within normal limits. IMPRESSION: 1. Small bowel obstruction. The transition point is in the right upper quadrant likely due to adhesions or related to previous anastomosis. 2. No focal perforation. 3. There is some free fluid within the anatomic pelvis. 4. Nonobstructing 10 mm right renal calculus. 5. Moderate intra and extrahepatic biliary dilation without significant interval change. No focal etiology is evident. Electronically Signed   By:  San Morelle M.D.   On: 07/31/2018 06:21      Assessment/Plan Morbid obesity BMI 43.2 Recent seizures 0/1751 Chronic diastolic heart failure/cardiomyopathy Chronic atrial fibrillation -on Eliquis Hypothyroid -prior thyroidectomy Severe osteoarthritis with limited mobility -walker dependent Fibromyalgia  Remote Hx asthma  Right nephrolithiasis/nonobstructing Hx of recurrent nephrolithiasis.  Recurrent small bowel obstruction Hx of gastroplasty/gastroplasty revision/laparotomy/laparoscopy x3 for adhesions.  Hx cholecystectomy/tubal ligation/renal calculus extraction x2  FEN: N.p.o./NG tube/IV fluids  ID: None DVT: On Eliquis being converted to IV heparin by Dr. Broadus John Follow-up: To be to determined C: POC:   Glorioso,Theodore Spouse   [phone]  [place] Niece [phone]    hough,cyara Friend   [phone]   Plan: NG tube has been placed it is in the tip and side-port of the proximal stomach.  Agree with NG decompression.  N.p.o. except for ice chips.  IV rehydration.  Medical management of her multiple medical issues.  We will initiate the small bowel protocol and follow with you.    Earnstine Regal [facility] Surgery 07/31/2018, 7:29 AM Pager: [phone]

## 2018-07-31 NOTE — ED Notes (Signed)
Date and time results received: 07/31/18  4:08 AM    Test: Potassium  Critical Value: 6.8  Name of Provider Notified: Palumbo   Orders Received? Or Actions Taken?: see orders for details; will continue to monitor patient.

## 2018-07-31 NOTE — ED Notes (Signed)
Patient complaining that IV placed by EMS is hurting. Attempted to place another IV but was unsuccessful. Will put in a consult for IV team.

## 2018-07-31 NOTE — ED Provider Notes (Addendum)
[facility] DEPT Provider Note   CSN: [id number] Arrival date & time: 07/31/18  0104    History   Chief Complaint Chief Complaint  Patient presents with  . Nausea  . Emesis  . Abdominal Pain    HPI Jillian Eaton is a 68 y.o. female.     The history is provided by the patient.  Emesis  Severity:  Mild Duration:  8 hours Timing:  Sporadic Quality:  Stomach contents Progression:  Unchanged Chronicity:  New Associated symptoms: abdominal pain   Associated symptoms: no cough and no fever   Risk factors: no alcohol use   Abdominal Pain  Pain location:  Generalized Pain radiates to:  Does not radiate Pain severity:  Severe Onset quality:  Gradual Duration:  8 hours Timing:  Constant Progression:  Unchanged Chronicity:  New Context: not alcohol use   Relieved by:  Nothing Worsened by:  Nothing Ineffective treatments:  None tried Associated symptoms: nausea and vomiting   Associated symptoms: no anorexia, no cough, no fever, no hematuria, no melena and no shortness of breath   Risk factors: no alcohol abuse   Patient with h/o SBO who presents with pain that she states is similar.  Last BM and flatus were Monday.  No f/c/r.    Past Medical History:  Diagnosis Date  . Anemia   . Asthma    flare up last yr  . CHF (congestive heart failure) ([place])   . Chronic headache   . DOE (dyspnea on exertion)    2D ECHO, 02/12/2012 - EF 60-65%, moderate concentric hypertrophy  . Fibromyalgia    nerve pain"left side at waist level" "can't lay on that side without pain" , "HOB elevation helps"  . Heart murmur   . Hematuria - cause not known   . History of kidney stones    x 2 '13, '14 surgery to remove  . Hypertension   . SBO (small bowel obstruction) ([place]) 06/07/2013  . Sleep apnea    tested several times and came up negative  . Thyroid disease    "goiter"  . Transfusion history    10 yrs+    Patient Active Problem List   Diagnosis Date Noted  . Rotator cuff tear arthropathy of right shoulder 04/05/2018  . Overactive bladder 04/05/2018  . Insomnia 04/05/2018  . Steroid-induced hyperglycemia   . Acute blood loss as cause of postoperative anemia   . Generalized OA   . Super obese   . 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 NICM (nonischemic cardiomyopathy) ([place]) 01/15/2018  . Osteoporosis 12/13/2017  . Chronic diastolic CHF (congestive heart failure) ([place]) 09/28/2017  . 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 Hypercapnemia 07/13/2017  . AKI (acute kidney injury) ([place]) 07/10/2017  . On anticoagulant therapy 06/19/2017  . GERD (gastroesophageal reflux disease) 08/28/2016  . 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 Depression 08/28/2016  . Iatrogenic hypocalcemia 08/28/2016  . Chronic pain syndrome   . Fibromyalgia   . Asthma   . Acute diastolic (congestive) heart failure ([place])   . Atrial fibrillation ([place]) 04/28/2016  . Vocal cord dysfunction 04/28/2016  . Thrombocytopenia ([place]) 04/28/2016  . Seizures ([place])   . Preoperative cardiovascular examination   . Leg swelling 10/19/2015  . Acute on chronic respiratory failure with hypoxia and hypercapnia ([place]) 08/03/2015  . Bilateral leg edema 05/11/2015  . Essential hypertension 05/11/2015  . Pulmonary hypertension ([place]) 01/08/2013  . Anemia of chronic disease 03/28/2011  . Morbid obesity ([place]) 09/22/2010  Past Surgical History:  Procedure Laterality Date  . CARDIAC CATHETERIZATION  04/04/2010   No significant obstructive coronary artery disease  . CARDIOVERSION N/A 08/08/2017   Procedure: CARDIOVERSION;  Surgeon: Pixie Casino, MD;  Location: [facility];  Service: Cardiovascular;  Laterality: N/A;  . CHOLECYSTECTOMY  1990  . COLONOSCOPY W/ POLYPECTOMY    . COLONOSCOPY WITH PROPOFOL N/A 04/10/2014   Procedure: COLONOSCOPY WITH PROPOFOL;  Surgeon: Beryle Beams, MD;  Location: [facility];  Service: Endoscopy;   Laterality: N/A;  . DIAGNOSTIC LAPAROSCOPY     x2 bowel obstructions(adhesions)  . EYE SURGERY     lasik 20-25 yrs ago  . 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 REVERSE SHOULDER ARTHROPLASTY Right 03/29/2018   Procedure: REVERSE SHOULDER ARTHROPLASTY;  Surgeon: Netta Cedars, MD;  Location: [facility];  Service: Orthopedics;  Laterality: Right;  . THYROIDECTOMY    . TUBAL LIGATION  1986     OB History   No obstetric history on file.      Home Medications    Prior to Admission medications   Medication Sig Start Date End Date Taking? Authorizing Provider  acetaminophen (TYLENOL) 650 MG CR tablet Take 1,300 mg by mouth 3 (three) times daily. At 6 am, 2 pm, and 10 pm    [provider]  apixaban (ELIQUIS) 5 MG TABS tablet Take 1 tablet (5 mg total) by mouth 2 (two) times daily. 04/05/18   Hennie Duos, MD  calcitRIOL (ROCALTROL) 0.5 MCG capsule TAKE 1 CAPSULE BY MOUTH TWICE A DAY 07/22/18   Philemon Kingdom, MD  calcium carbonate (OS-CAL - DOSED IN MG OF ELEMENTAL CALCIUM) 1250 (500 Ca) MG tablet Take 2 tablets (1,000 mg of elemental calcium total) by mouth 2 (two) times daily. Patient taking differently: Take 2 tablets by mouth 2 (two) times daily with a meal.  04/05/18   Hennie Duos, MD  clotrimazole (LOTRIMIN AF) 1 % cream Apply 1 application topically 2 (two) times daily. 05/21/18   Marzetta Board, DPM  cyanocobalamin (,VITAMIN B-12,) 1000 MCG/ML injection Inject 1 mL (1,000 mcg total) into the muscle every 30 (thirty) days. 04/05/18   Hennie Duos, MD  cyclobenzaprine (FLEXERIL) 10 MG tablet Take 2 tablets (20 mg total) by mouth 2 (two) times daily. 04/05/18   Hennie Duos, MD  diclofenac sodium (VOLTAREN) 1 % GEL Apply 2 g topically 2 (two) times daily as needed (for knee pain). 04/05/18   Hennie Duos, MD  DULoxetine (CYMBALTA) 60 MG capsule Take 1 capsule (60 mg total) by mouth daily. 04/05/18   Hennie Duos,  MD  eszopiclone (LUNESTA) 2 MG TABS tablet Take 1 tablet (2 mg total) by mouth at bedtime. 04/05/18   Hennie Duos, MD  FERREX 150 150 MG capsule Take 150 mg by mouth 2 (two) times daily. 05/01/18   [provider]  furosemide (LASIX) 20 MG tablet Take 1 tablet (20 mg total) by mouth daily as needed (swelling). 06/04/18 09/02/18  Pixie Casino, MD  levETIRAcetam (KEPPRA) 500 MG tablet TAKE 1 TABLET BY MOUTH TWICE A DAY 07/23/18   Kathrynn Ducking, MD  levothyroxine (SYNTHROID) 200 MCG tablet TAKE 1 TABLET BY MOUTH EVERY DAY BEFORE BREAKFAST 06/14/18   Hilty, Nadean Corwin, MD  LYRICA 200 MG capsule Take 1 capsule (200 mg total) by mouth 2 (two) times daily. 04/05/18   Hennie Duos, MD  magnesium oxide (MAG-OX)  400 MG tablet Take 1 tablet (400 mg total) by mouth daily. 04/05/18   Hennie Duos, MD  metoprolol succinate (TOPROL-XL) 25 MG 24 hr tablet TAKE 1 TABLET BY MOUTH IN AM AND TAKE YOUR 50 MG TABLETS IN THE EVENING TAKE WITH FOOD 06/27/18   Hilty, Nadean Corwin, MD  metoprolol succinate (TOPROL-XL) 50 MG 24 hr tablet TAKE ONE TABLET BY MOUTH IN THE EVENING Take with or immediately following a meal. TAKE 25 MG TABLETS IN THE MORNING 06/27/18   Hilty, Nadean Corwin, MD  mirabegron ER (MYRBETRIQ) 50 MG TB24 tablet Take 1 tablet (50 mg total) by mouth daily. 04/05/18   Hennie Duos, MD  NONFORMULARY OR COMPOUNDED ITEM [facility]:  Antifungal Cream - Terbinafine 3%, Fluconazole 2%, Tea Tree Oil 5%, Urea 10%, Ibuprofen 2% in DMSO Suspension #32ml. Apply to affected toenail(s) once daily (at bedtime) or twice daily. 05/07/18   Marzetta Board, DPM  senna (SENOKOT) 8.6 MG TABS tablet Take 1 tablet (8.6 mg total) by mouth daily. 04/05/18   Hennie Duos, MD  traMADol (ULTRAM) 50 MG tablet Take 50 mg by mouth every 6 (six) hours as needed for moderate pain.  04/30/18   [provider]  traZODone (DESYREL) 50 MG tablet Take 1 tablet (50 mg total) by mouth at bedtime. 04/05/18    Hennie Duos, MD  Vitamin D, Ergocalciferol, (DRISDOL) 1.25 MG (50000 UT) CAPS capsule Take 1 capsule (50,000 Units total) by mouth every Wednesday. 04/10/18   Hennie Duos, MD    Family History Family History  Problem Relation Age of Onset  . Diabetes Mother   . Epilepsy Mother   . Cancer Mother        Breast  . Hypertension Mother   . Breast cancer Mother   . Seizures Mother   . Kidney disease Father   . Diabetes Father   . Hypertension Father   . Asthma Father   . Heart disease Father   . Epilepsy Sister   . Cancer Maternal Grandmother   . Breast cancer Maternal Grandmother   . Cancer Paternal Grandmother   . Breast cancer Paternal Grandmother     Social History Social History   Tobacco Use  . Smoking status: Never Smoker  . Smokeless tobacco: Never Used  Substance Use Topics  . Alcohol use: No    Comment: wine occ  . Drug use: No    Types: Oxycodone    Comment: perscribed     Allergies   Norco [hydrocodone-acetaminophen]   Review of Systems Review of Systems  Constitutional: Negative for fever.  Respiratory: Negative for cough and shortness of breath.   Gastrointestinal: Positive for abdominal pain, nausea and vomiting. Negative for anorexia and melena.  Genitourinary: Negative for hematuria.  All other systems reviewed and are negative.    Physical Exam Updated Vital Signs BP (!) 126/95 (BP Location: Left Arm)   Pulse 79   Temp 98 F (36.7 C) (Oral)   Resp 15   Ht 5\' 5"  (1.651 m)   Wt 117.9 kg   SpO2 97%   BMI 43.27 kg/m   Physical Exam Vitals signs and nursing note reviewed.  Constitutional:      Appearance: She is obese. She is not ill-appearing.  HENT:     Head: Normocephalic and atraumatic.     Nose: Nose normal.     Mouth/Throat:     Mouth: Mucous membranes are moist.     Pharynx: Oropharynx is clear.  Eyes:     Conjunctiva/sclera: Conjunctivae normal.     Pupils: Pupils are equal, round, and reactive to light.  Neck:      Musculoskeletal: Normal range of motion and neck supple.  Cardiovascular:     Rate and Rhythm: Normal rate and regular rhythm.     Pulses: Normal pulses.     Heart sounds: Normal heart sounds.  Pulmonary:     Effort: Pulmonary effort is normal.     Breath sounds: Normal breath sounds.  Abdominal:     General: Bowel sounds are decreased.     Palpations: Abdomen is soft.     Tenderness: There is no guarding or rebound.  Musculoskeletal: Normal range of motion.  Skin:    General: Skin is warm and dry.     Capillary Refill: Capillary refill takes less than 2 seconds.  Neurological:     General: No focal deficit present.     Mental Status: She is alert and oriented to person, place, and time.  Psychiatric:        Mood and Affect: Mood normal.        Behavior: Behavior normal.      ED Treatments / Results  Labs (all labs ordered are listed, but only abnormal results are displayed) Results for orders placed or performed during the hospital encounter of 07/31/18  CBC with Differential/Platelet  Result Value Ref Range   WBC 7.5 4.0 - 10.5 K/uL   RBC 4.82 3.87 - 5.11 MIL/uL   Hemoglobin 14.2 12.0 - 15.0 g/dL   HCT 44.8 36.0 - 46.0 %   MCV 92.9 80.0 - 100.0 fL   MCH 29.5 26.0 - 34.0 pg   MCHC 31.7 30.0 - 36.0 g/dL   RDW 15.8 (H) 11.5 - 15.5 %   Platelets 203 150 - 400 K/uL   nRBC 0.0 0.0 - 0.2 %   Neutrophils Relative % 71 %   Neutro Abs 5.3 1.7 - 7.7 K/uL   Lymphocytes Relative 19 %   Lymphs Abs 1.5 0.7 - 4.0 K/uL   Monocytes Relative 7 %   Monocytes Absolute 0.5 0.1 - 1.0 K/uL   Eosinophils Relative 2 %   Eosinophils Absolute 0.2 0.0 - 0.5 K/uL   Basophils Relative 1 %   Basophils Absolute 0.0 0.0 - 0.1 K/uL   Immature Granulocytes 0 %   Abs Immature Granulocytes 0.02 0.00 - 0.07 K/uL  Comprehensive metabolic panel  Result Value Ref Range   Sodium 139 135 - 145 mmol/L   Potassium 6.8 (HH) 3.5 - 5.1 mmol/L   Chloride 106 98 - 111 mmol/L   CO2 25 22 - 32 mmol/L    Glucose, Bld 101 (H) 70 - 99 mg/dL   BUN 22 8 - 23 mg/dL   Creatinine, Ser 0.87 0.44 - 1.00 mg/dL   Calcium 8.2 (L) 8.9 - 10.3 mg/dL   Total Protein 8.0 6.5 - 8.1 g/dL   Albumin 4.0 3.5 - 5.0 g/dL   AST 25 15 - 41 U/L   ALT 12 0 - 44 U/L   Alkaline Phosphatase 83 38 - 126 U/L   Total Bilirubin 0.7 0.3 - 1.2 mg/dL   GFR calc non Af Amer >60 >60 mL/min   GFR calc Af Amer >60 >60 mL/min   Anion gap 8 5 - 15   No results found.  EKG None  Radiology Ct Abdomen Pelvis W Contrast  Result Date: 07/31/2018 CLINICAL DATA:  Left lower quadrant abdominal pain beginning at 6  o'clock p.m. Previous small-bowel obstructions. EXAM: CT ABDOMEN AND PELVIS WITH CONTRAST TECHNIQUE: Multidetector CT imaging of the abdomen and pelvis was performed using the standard protocol following bolus administration of intravenous contrast. CONTRAST:  151mL OMNIPAQUE IOHEXOL 300 MG/ML  SOLN COMPARISON:  CT of the abdomen and pelvis 06/05/2017 at [facility]. CT of the abdomen and pelvis 10/03/2013 at [facility]. FINDINGS: Lower chest: Heart is enlarged, particularly the right atrium. This is stable. No significant pleural or pericardial effusion is present. Mild dependent atelectasis is present at both lung bases. Hepatobiliary: Moderate intra and extrahepatic biliary dilation is present. The patient is status post cholecystectomy. No obstructing lesion is present. No discrete hepatic lesions are present. Pancreas: Unremarkable. No pancreatic ductal dilatation or surrounding inflammatory changes. Spleen: Normal in size without focal abnormality. Adrenals/Urinary Tract: The adrenal glands are normal bilaterally. A 10 mm nonobstructing stone is present at the lower pole of the right kidney. Additional punctate nonobstructing stone is present at the upper pole of the right kidney. No other mass lesion is present. There is no obstruction. Ureters are normal. The urinary bladder is  decompressed. 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 Small bowel is diffusely dilated. There is marked dilation in the right upper quadrant at the level of a previous anastomosis, just proximal to the colon. Colon is decompressed. Appendix is visualized and normal. Vascular/Lymphatic: No significant vascular calcifications or adenopathy is present Reproductive: Calcified fibroid uterus is again noted. Adnexa are unremarkable. Other: Minimal free fluid is present within the anatomic pelvis. Musculoskeletal: Stable grade 1 anterolisthesis is present at L4-5. Vertebral body heights and alignment otherwise normal. Mild leftward curvature is present. Bony pelvis is intact. Hips are located and within normal limits. IMPRESSION: 1. Small bowel obstruction. The transition point is in the right upper quadrant likely due to adhesions or related to previous anastomosis. 2. No focal perforation. 3. There is some free fluid within the anatomic pelvis. 4. Nonobstructing 10 mm right renal calculus. 5. Moderate intra and extrahepatic biliary dilation without significant interval change. No focal etiology is evident. Electronically Signed   By: San Morelle M.D.   On: 07/31/2018 06:21   Procedures Procedures (including critical care time)  Medications Ordered in ED Medications  fentaNYL (SUBLIMAZE) injection 100 mcg (has no administration in time range)  fentaNYL (SUBLIMAZE) injection 50 mcg (50 mcg Intravenous Given 07/31/18 0419)  ondansetron (ZOFRAN) injection 4 mg (4 mg Intravenous Given 07/31/18 0419)  sodium chloride (PF) 0.9 % injection (  Given by Other 07/31/18 0529)  iohexol (OMNIPAQUE) 300 MG/ML solution 100 mL (100 mLs Intravenous Contrast Given 07/31/18 0529)  fentaNYL (SUBLIMAZE) injection 100 mcg (100 mcg Intravenous Given 07/31/18 0634)    633 case d/e Dr. Barry Dienes of surgery [facility] to see the patient, please admit to medicine.  Please perform covid swab.  Covid swab ordered on behest of  surgical team.     Patient is reluctant to have NGT placed.    Final Clinical Impressions(s) / ED Diagnoses     Admit to medicine for SBO  Dontarius Sheley, MD 07/31/18 [place], Tiyanna Larcom, MD 07/31/18 3710

## 2018-07-31 NOTE — ED Notes (Signed)
X-ray at bedside to verify NG tube placement at this time.

## 2018-07-31 NOTE — ED Notes (Signed)
Patient transported to CT 

## 2018-07-31 NOTE — ED Notes (Signed)
RN attempted to get blue top for clotting factors x2. RN unable to obtain blood. Main phlebotomy called for assistance.

## 2018-07-31 NOTE — ED Notes (Signed)
Following administration of dilaudid, patient's O2 sats dipped down into the mid 80's. RN applied 2L O2 via Nasal Canula. Pt's saturation came back up to 93%. Will continue to monitor.

## 2018-07-31 NOTE — Progress Notes (Signed)
The only time I performed an EGD was on 03/25/2010. At that time she was complaining about GERD and the EGD showed a normal Z-line at 40 cm. Her gastric pouch was 3 cm. Otherwise it was a normal examination. I hope that helps.   Dr. Benson [place]

## 2018-07-31 NOTE — Progress Notes (Addendum)
[facility] for heparin Indication: hx atrial fibrillation (home Eliquis on hold)  Allergies  Allergen Reactions  . Norco [Hydrocodone-Acetaminophen] Itching    Patient Measurements: Height: 5\' 5"  (165.1 cm) Weight: 260 lb (117.9 kg) IBW/kg (Calculated) : 57 Heparin Dosing Weight: 85 kg  Vital Signs: Temp: 98 F (36.7 C) (06/10 0137) Temp Source: Oral (06/10 0137) BP: 139/89 (06/10 2458) Pulse Rate: 91 (06/10 0638)  Labs: Recent Labs    07/31/18 0334  HGB 14.2  HCT 44.8  PLT 203  CREATININE 0.87    Estimated Creatinine Clearance: 81.7 mL/min (by C-G formula based on SCr of 0.87 mg/dL).   Medications:  - on Eliquis 5 mg bid PTA (Last dose taken on 07/30/18 at noon)  Assessment: Patient's a 68 y.o F with hx SBO and afib on Eliquis PTA, presented to the ED on 6/10 with c/o abdominal pain, n/v.  Abdominal CT on 6/10 showed SBO.  To transition anticoag. to heparin drip in case surgical intervention is needed.  Goal of Therapy:  Heparin level 0.3-0.7 units/ml aPTT 66-102 seconds Monitor platelets by anticoagulation protocol: Yes   Plan:  - baseline aPTT and heparin level now - start heparin drip at 1250 units/hr (no bolus) - check 6 hr heparin level and aPTT - monitor for s/s bleeding - f/u with [facility] recom.  Sala Tague P 07/31/2018,8:28 AM  _____________________________________  [facility] (6:30p): First heparin level now back elevated at 1.18, but this likely from residual effect of Eliquis. APTT is therapeutic at 79 secs. Will adjust heparin rate based on aPTT at this time. - continue with heparin drip at 1250 units/hr  Dia Sitter, PharmD, BCPS 07/31/2018 6:33 PM  - f/u with AM labs and adjust as needed

## 2018-07-31 NOTE — H&P (Addendum)
History and Physical    Jillian Eaton VOH:[id number] DOB: 06-04-1950 DOA: 07/31/2018  Referring MD/NP/PA: EDP PCP:  Patient coming from: Home  Chief Complaint: Abdominal pain and vomiting  HPI: Jillian Eaton is a 68 y.o. 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 -She denies any diarrhea, no fevers or chills. ED Course: CT abdomen pelvis was notable for small bowel obstruction with transition point in the right upper quadrant  Review of Systems: As per HPI otherwise 14 point review of systems negative.   Past Medical History:  Diagnosis Date   Anemia    Asthma    flare up last yr   CHF (congestive heart failure) (HCC)    Chronic headache    DOE (dyspnea on exertion)    2D ECHO, 02/12/2012 - EF 60-65%, moderate concentric hypertrophy   Fibromyalgia    nerve pain"left side at waist level" "can't lay on that side without pain" , "HOB elevation helps"   Heart murmur    Hematuria - cause not known    History of kidney stones    x 2 '13, '14 surgery to remove   Hypertension    SBO (small bowel obstruction) ([place]) 06/07/2013   Sleep apnea    tested several times and came up negative   Thyroid disease    "goiter"   Transfusion history    10 yrs+      Past Surgical History:  Procedure Laterality Date   CARDIAC CATHETERIZATION  04/04/2010   No significant obstructive coronary artery disease   CARDIOVERSION N/A 08/08/2017   Procedure: CARDIOVERSION;  Surgeon: Pixie Casino, MD;  Location: [facility];  Service: Cardiovascular;  Laterality: N/A;   CHOLECYSTECTOMY  1990   COLONOSCOPY W/ POLYPECTOMY     COLONOSCOPY WITH PROPOFOL N/A 04/10/2014   Procedure: COLONOSCOPY WITH PROPOFOL;  Surgeon: Beryle Beams, MD;  Location: [facility];  Service: Endoscopy;  Laterality: N/A;   DIAGNOSTIC LAPAROSCOPY     x2 bowel obstructions(adhesions)   EYE SURGERY     lasik 20-25 yrs ago   GASTROPLASTY  1985   "weigh loss", 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 She has never used smokeless tobacco. She reports that she does not drink alcohol or use drugs.  Allergies  Allergen Reactions   Norco [Hydrocodone-Acetaminophen] Itching    Family History  Problem Relation Age of Onset   Diabetes Mother    Epilepsy Mother    Cancer Mother        Breast   Hypertension Mother    Breast cancer Mother    Seizures Mother  Kidney disease Father    Diabetes Father    Hypertension Father    Asthma Father    Heart disease Father    Epilepsy Sister    Cancer Maternal Grandmother    Breast cancer Maternal Grandmother    Cancer Paternal Grandmother    Breast cancer Paternal Grandmother      Prior to Admission medications   Medication Sig Start Date End Date Taking? Authorizing Provider  acetaminophen (TYLENOL) 650 MG CR tablet Take 1,300 mg by mouth 3 (three) times daily. At 6 am, 2 pm, and 10 pm   Yes [provider]  apixaban (ELIQUIS) 5 MG  TABS tablet Take 1 tablet (5 mg total) by mouth 2 (two) times daily. 04/05/18  Yes Hennie Duos, MD  calcitRIOL (ROCALTROL) 0.5 MCG capsule TAKE 1 CAPSULE BY MOUTH TWICE A DAY Patient taking differently: Take 0.5 mcg by mouth 2 (two) times a day.  07/22/18  Yes Philemon Kingdom, MD  calcium carbonate (OS-CAL - DOSED IN MG OF ELEMENTAL CALCIUM) 1250 (500 Ca) MG tablet Take 2 tablets (1,000 mg of elemental calcium total) by mouth 2 (two) times daily. Patient taking differently: Take 2 tablets by mouth 2 (two) times daily with a meal.  04/05/18  Yes Hennie Duos, MD  clotrimazole (LOTRIMIN AF) 1 % cream Apply 1 application topically 2 (two) times daily. 05/21/18  Yes Marzetta Board, DPM  cyanocobalamin (,VITAMIN B-12,) 1000 MCG/ML injection Inject 1 mL (1,000 mcg total) into the muscle every 30 (thirty) days. 04/05/18  Yes Hennie Duos, MD  cyclobenzaprine (FLEXERIL) 10 MG tablet Take 2 tablets (20 mg total) by mouth 2 (two) times daily. 04/05/18  Yes Hennie Duos, MD  diclofenac sodium (VOLTAREN) 1 % GEL Apply 2 g topically 2 (two) times daily as needed (for knee pain). 04/05/18  Yes Hennie Duos, MD  DULoxetine (CYMBALTA) 60 MG capsule Take 1 capsule (60 mg total) by mouth daily. 04/05/18  Yes Hennie Duos, MD  eszopiclone (LUNESTA) 2 MG TABS tablet Take 1 tablet (2 mg total) by mouth at bedtime. 04/05/18  Yes Hennie Duos, MD  FERREX 150 150 MG capsule Take 150 mg by mouth 2 (two) times daily. 05/01/18  Yes [provider]  furosemide (LASIX) 20 MG tablet Take 1 tablet (20 mg total) by mouth daily as needed (swelling). 06/04/18 09/02/18 Yes Hilty, Nadean Corwin, MD  levETIRAcetam (KEPPRA) 500 MG tablet TAKE 1 TABLET BY MOUTH TWICE A DAY Patient taking differently: Take 500 mg by mouth 2 (two) times daily.  07/23/18  Yes Kathrynn Ducking, MD  levothyroxine (SYNTHROID) 200 MCG tablet TAKE 1 TABLET BY MOUTH EVERY DAY BEFORE BREAKFAST Patient taking differently: Take 200  mcg by mouth daily before breakfast.  06/14/18  Yes Hilty, Nadean Corwin, MD  magnesium oxide (MAG-OX) 400 MG tablet Take 1 tablet (400 mg total) by mouth daily. 04/05/18  Yes Hennie Duos, MD  metoprolol succinate (TOPROL-XL) 25 MG 24 hr tablet TAKE 1 TABLET BY MOUTH IN AM AND TAKE YOUR 50 MG TABLETS IN THE EVENING TAKE WITH FOOD Patient taking differently: Take 25 mg by mouth See admin instructions. TAKE 1 TABLET BY MOUTH IN AM AND TAKE YOUR 50 MG TABLETS IN THE EVENING TAKE WITH FOOD 06/27/18  Yes Hilty, Nadean Corwin, MD  metoprolol succinate (TOPROL-XL) 50 MG 24 hr tablet TAKE ONE TABLET BY MOUTH IN THE EVENING Take with or immediately following a meal. TAKE 25 MG TABLETS IN THE  MORNING Patient taking differently: Take 50 mg by mouth See admin instructions. TAKE ONE TABLET BY MOUTH IN THE EVENING Take with or immediately following a meal. TAKE 25 MG TABLETS IN THE MORNING 06/27/18  Yes Hilty, Nadean Corwin, MD  mirabegron ER (MYRBETRIQ) 50 MG TB24 tablet Take 1 tablet (50 mg total) by mouth daily. 04/05/18  Yes Hennie Duos, MD  NONFORMULARY OR COMPOUNDED ITEM [facility]:  Antifungal Cream - Terbinafine 3%, Fluconazole 2%, Tea Tree Oil 5%, Urea 10%, Ibuprofen 2% in DMSO Suspension #46ml. Apply to affected toenail(s) once daily (at bedtime) or twice daily. 05/07/18  Yes Marzetta Board, DPM  senna (SENOKOT) 8.6 MG TABS tablet Take 1 tablet (8.6 mg total) by mouth daily. 04/05/18  Yes Hennie Duos, MD  traMADol (ULTRAM) 50 MG tablet Take 50 mg by mouth every 6 (six) hours as needed for moderate pain.  04/30/18  Yes [provider]  traZODone (DESYREL) 50 MG tablet Take 1 tablet (50 mg total) by mouth at bedtime. 04/05/18  Yes Hennie Duos, MD  Vitamin D, Ergocalciferol, (DRISDOL) 1.25 MG (50000 UT) CAPS capsule Take 1 capsule (50,000 Units total) by mouth every Wednesday. 04/10/18  Yes Hennie Duos, MD  LYRICA 200 MG capsule Take 1 capsule (200 mg total) by mouth 2 (two) times  daily. Patient not taking: Reported on 07/31/2018 04/05/18   Hennie Duos, MD    Physical Exam: Vitals:   07/31/18 0230 07/31/18 0332 07/31/18 0445 07/31/18 0638  BP: 111/82 (!) 126/95 (!) 126/92 139/89  Pulse: 83 79 87 91  Resp: 16 15 13 12   Temp:      TempSrc:      SpO2: 98% 97% 100% 99%  Weight:      Height:          Constitutional: Obese female, appears much older than stated age, AAO x3, mild discomfort, NG tube in situ Vitals:   07/31/18 0230 07/31/18 0332 07/31/18 0445 07/31/18 0638  BP: 111/82 (!) 126/95 (!) 126/92 139/89  Pulse: 83 79 87 91  Resp: 16 15 13 12   Temp:      TempSrc:      SpO2: 98% 97% 100% 99%  Weight:      Height:       Eyes: PERRL, lids and conjunctivae normal ENMT: NG tube noted, dry oral mucosa Neck: Neck obese, no JVD Respiratory: Distant breath sounds, few rhonchi at the bases  Cardiovascular: 1 S2/irregularly irregular rhythm Abdomen: Soft obese, mild right sided tenderness noted, decreased bowel sounds Musculoskeletal: Decreased range of motion, Ext: Obese lower extremities, 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  Neurologic: Moves all extremities, no localizing signs Psychiatric: Normal judgment and insight. Alert and oriented x 3. Normal mood.   Labs on Admission: I have personally reviewed following labs and imaging studies  CBC: Recent Labs  Lab 07/31/18 0334  WBC 7.5  NEUTROABS 5.3  HGB 14.2  HCT 44.8  MCV 92.9  PLT 062   Basic Metabolic Panel: Recent Labs  Lab 07/31/18 0334 07/31/18 0521  NA 139  --   K 6.8* 4.4  CL 106  --   CO2 25  --   GLUCOSE 101*  --   BUN 22  --   CREATININE 0.87  --   CALCIUM 8.2*  --    GFR: Estimated Creatinine Clearance: 81.7 mL/min (by C-G formula based on SCr of 0.87 mg/dL). Liver Function Tests: Recent Labs  Lab 07/31/18  0334  AST 25  ALT 12  ALKPHOS 83  BILITOT 0.7  PROT 8.0  ALBUMIN 4.0   No results for input(s): LIPASE, AMYLASE in  the last 168 hours. No results for input(s): AMMONIA in the last 168 hours. Coagulation Profile: No results for input(s): INR, PROTIME in the last 168 hours. Cardiac Enzymes: No results for input(s): CKTOTAL, CKMB, CKMBINDEX, TROPONINI in the last 168 hours. BNP (last 3 results) No results for input(s): PROBNP in the last 8760 hours. HbA1C: No results for input(s): HGBA1C in the last 72 hours. CBG: No results for input(s): GLUCAP in the last 168 hours. Lipid Profile: No results for input(s): CHOL, HDL, LDLCALC, TRIG, CHOLHDL, LDLDIRECT in the last 72 hours. Thyroid Function Tests: No results for input(s): TSH, T4TOTAL, FREET4, T3FREE, THYROIDAB in the last 72 hours. Anemia Panel: No results for input(s): VITAMINB12, FOLATE, FERRITIN, TIBC, IRON, RETICCTPCT in the last 72 hours. Urine analysis:    Component Value Date/Time   COLORURINE YELLOW 07/31/2018 0657   APPEARANCEUR HAZY (A) 07/31/2018 0657   LABSPEC 1.015 07/31/2018 0657   PHURINE 5.0 07/31/2018 0657   GLUCOSEU NEGATIVE 07/31/2018 0657   HGBUR MODERATE (A) 07/31/2018 0657   BILIRUBINUR NEGATIVE 07/31/2018 0657   KETONESUR NEGATIVE 07/31/2018 0657   PROTEINUR NEGATIVE 07/31/2018 0657   UROBILINOGEN 1.0 06/06/2013 2154   NITRITE POSITIVE (A) 07/31/2018 0657   LEUKOCYTESUR SMALL (A) 07/31/2018 0657   Sepsis Labs: @LABRCNTIP (procalcitonin:4,lacticidven:4) )No results found for this or any previous visit (from the past 240 hour(s)).   Radiological Exams on Admission: Dg Abdomen 1 View  Result Date: 07/31/2018 CLINICAL DATA:  Nasogastric tube placement EXAM: ABDOMEN - 1 VIEW COMPARISON:  CT abdomen and pelvis July 31, 2018. FINDINGS: Nasogastric tube tip and side port are in the proximal stomach. Visualized bowel is distended. No free air evident. Visualized lungs are clear. There is cardiomegaly. IMPRESSION: Nasogastric tube tip and side port in proximal stomach. Bowel dilatation persists. No free air. Cardiomegaly.  Visualized lungs clear. Electronically Signed   By: Lowella Grip III M.D.   On: 07/31/2018 07:54   Ct Abdomen Pelvis W Contrast  Result Date: 07/31/2018 CLINICAL DATA:  Left lower quadrant abdominal pain beginning at 6 o'clock p.m. Previous small-bowel obstructions. EXAM: CT ABDOMEN AND PELVIS WITH CONTRAST TECHNIQUE: Multidetector CT imaging of the abdomen and pelvis was performed using the standard protocol following bolus administration of intravenous contrast. CONTRAST:  18mL OMNIPAQUE IOHEXOL 300 MG/ML  SOLN COMPARISON:  CT of the abdomen and pelvis 06/05/2017 at [facility]. CT of the abdomen and pelvis 10/03/2013 at [facility]. FINDINGS: Lower chest: Heart is enlarged, particularly the right atrium. This is stable. No significant pleural or pericardial effusion is present. Mild dependent atelectasis is present at both lung bases. Hepatobiliary: Moderate intra and extrahepatic biliary dilation is present. The patient is status post cholecystectomy. No obstructing lesion is present. No discrete hepatic lesions are present. Pancreas: Unremarkable. No pancreatic ductal dilatation or surrounding inflammatory changes. Spleen: Normal in size without focal abnormality. Adrenals/Urinary Tract: The adrenal glands are normal bilaterally. A 10 mm nonobstructing stone is present at the lower pole of the right kidney. Additional punctate nonobstructing stone is present at the upper pole of the right kidney. No other mass lesion is present. There is no obstruction. Ureters are normal. The urinary bladder is decompressed. 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 Small bowel is diffusely dilated. There is marked dilation in the right upper quadrant at the level  of a previous anastomosis, just proximal to the colon. Colon is decompressed. Appendix is visualized and normal. Vascular/Lymphatic: No significant vascular calcifications or adenopathy is present  Reproductive: Calcified fibroid uterus is again noted. Adnexa are unremarkable. Other: Minimal free fluid is present within the anatomic pelvis. Musculoskeletal: Stable grade 1 anterolisthesis is present at L4-5. Vertebral body heights and alignment otherwise normal. Mild leftward curvature is present. Bony pelvis is intact. Hips are located and within normal limits. IMPRESSION: 1. Small bowel obstruction. The transition point is in the right upper quadrant likely due to adhesions or related to previous anastomosis. 2. No focal perforation. 3. There is some free fluid within the anatomic pelvis. 4. Nonobstructing 10 mm right renal calculus. 5. Moderate intra and extrahepatic biliary dilation without significant interval change. No focal etiology is evident. Electronically Signed   By: San Morelle M.D.   On: 07/31/2018 06:21    EKG: Independently reviewed.   Assessment/Plan Principal Problem:    SBO (small bowel obstruction) (HCC) -Likely secondary to multiple prior surgeries, adhesions -General surgery consulted -NG tube to intermittent low wall suction, small bowel protocol, gentle IV fluids, antiemetics, supportive care -May require surgery if no improvement with conservative management -Hold Eliquis, use IV heparin for now  Paroxysmal atrial fibrillation -In atrial fibrillation at this time, change metoprolol to IV -Hold Eliquis, use IV heparin in the interim  Chronic diastolic CHF  -Clinically euvolemic at this time -Holding Lasix, gentle IV fluids for 24 hours while n.p.o. with NG suction -Monitor volume status closely    Pulmonary hypertension ([place]) -this is likely secondary to morbid obesity, OSA/OHS -Needs sleep study down the road -Resume diuretics as needed    Seizures ([place]) -Continue Keppra changed to IV    Chronic pain syndrome/fibro-myalgia -Holding tramadol, Flexeril    Postoperative hypothyroidism -Change Synthroid to IV  Morbid obesity -Continue  lifestyle modification  Intra and extrahepatic biliary dilation -likely post cholecystectomy finding, LFts normal  DVT prophylaxis: IV heparin Code Status: Full code Family Communication: No family at bedside, just discussed extensively with patient Disposition Plan: Home pending clinical improvement Consults called: General surgery Admission status: Inpatient  Domenic Polite MD [facility]   07/31/2018, 8:12 AM

## 2018-07-31 NOTE — ED Triage Notes (Signed)
Per EMS, patient coming from home with complaints of left lower quadrant abdominal pain starting around 6 PM. Patient endorses nausea and vomiting, with three episodes of vomiting. Last BM was about 24 hours ago. Patient has a history of bowel obstructions and states that this feels similar to that.   EMS administered 4 mg of Zofran and 100 mcg of Fentanyl.

## 2018-07-31 NOTE — Progress Notes (Signed)
Pt given dilaudid 2mg  IV and benadryl 12.5mg  iv for pain and itching that pain med causes. Pt's sats dropped to 70's. 02 applied and provider on call notified.

## 2018-07-31 NOTE — ED Notes (Signed)
ED TO INPATIENT HANDOFF REPORT  ED Nurse Name and Phone #: Christinia Gully Name/Age/Gender [place] 68 y.o. female Room/Bed: WA24/WA24  Code Status   Code Status: Full Code  Home/SNF/Other Given to floor Patient oriented to: self, place, time and situation Is this baseline? Yes   Triage Complete: Triage complete  Chief Complaint Abdominal Pain Emesis Nausea  Triage Note Per EMS, patient coming from home with complaints of left lower quadrant abdominal pain starting around 6 PM. Patient endorses nausea and vomiting, with three episodes of vomiting. Last BM was about 24 hours ago. Patient has a history of bowel obstructions and states that this feels similar to that.   EMS administered 4 mg of Zofran and 100 mcg of Fentanyl.    Allergies Allergies  Allergen Reactions  . Norco [Hydrocodone-Acetaminophen] Itching    Level of Care/Admitting Diagnosis ED Disposition    ED Disposition Condition Comment   Admit  Hospital Area: [facility] [[id number]]  Level of Care: Telemetry [5]  Admit to tele based on following criteria: Complex arrhythmia (Bradycardia/Tachycardia)  Covid Evaluation: Screening Protocol (No Symptoms)  Diagnosis: SBO (small bowel obstruction) ([place]) [[id number]]  Admitting Physician: Domenic Polite [[id number]]  Attending Physician: Domenic Polite [[id number]]  Estimated length of stay: 3 - 4 days  Certification:: I certify this patient will need inpatient services for at least 2 midnights  PT Class (Do Not Modify): Inpatient [101]  PT Acc Code (Do Not Modify): Private [1]       B Medical/Surgery History Past Medical History:  Diagnosis Date  . Anemia   . Asthma    flare up last yr  . CHF (congestive heart failure) ([place])   . Chronic headache   . DOE (dyspnea on exertion)    2D ECHO, 02/12/2012 - EF 60-65%, moderate concentric hypertrophy  . Fibromyalgia    nerve pain"left side at waist level" "can't lay on that side without  pain" , "HOB elevation helps"  . Heart murmur   . Hematuria - cause not known   . History of kidney stones    x 2 '13, '14 surgery to remove  . Hypertension   . SBO (small bowel obstruction) ([place]) 06/07/2013  . Sleep apnea    tested several times and came up negative  . Thyroid disease    "goiter"  . Transfusion history    10 yrs+   Past Surgical History:  Procedure Laterality Date  . CARDIAC CATHETERIZATION  04/04/2010   No significant obstructive coronary artery disease  . CARDIOVERSION N/A 08/08/2017   Procedure: CARDIOVERSION;  Surgeon: Pixie Casino, MD;  Location: [facility];  Service: Cardiovascular;  Laterality: N/A;  . CHOLECYSTECTOMY  1990  . COLONOSCOPY W/ POLYPECTOMY    . COLONOSCOPY WITH PROPOFOL N/A 04/10/2014   Procedure: COLONOSCOPY WITH PROPOFOL;  Surgeon: Beryle Beams, MD;  Location: [facility];  Service: Endoscopy;  Laterality: N/A;  . DIAGNOSTIC LAPAROSCOPY     x2 bowel obstructions(adhesions)  . EYE SURGERY     lasik 20-25 yrs ago  . 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 REVERSE SHOULDER ARTHROPLASTY Right 03/29/2018   Procedure: REVERSE SHOULDER ARTHROPLASTY;  Surgeon: Netta Cedars, MD;  Location: [facility];  Service: Orthopedics;  Laterality: Right;  . THYROIDECTOMY    . TUBAL LIGATION  1986     A IV Location/Drains/Wounds Patient Lines/Drains/Airways Status   Active Line/Drains/Airways    Name:   Placement  date:   Placement time:   Site:   Days:   Peripheral IV 07/31/18 Right;Posterior Forearm   07/31/18    0331    Forearm   less than 1   CentriMag LVAD   07/14/17    0934    -   382   NG/OG Tube Nasogastric 14 Fr. Right nare Aucultation Measured external length of tube   07/31/18    0658    Right nare   less than 1          Intake/Output Last 24 hours  Intake/Output Summary (Last 24 hours) at 07/31/2018 1622 Last data filed at 07/31/2018 1409 Gross per 24 hour  Intake 100 ml  Output 1 ml  Net 99 ml     Labs/Imaging Results for orders placed or performed during the hospital encounter of 07/31/18 (from the past 48 hour(s))  CBC with Differential/Platelet     Status: Abnormal   Collection Time: 07/31/18  3:34 AM  Result Value Ref Range   WBC 7.5 4.0 - 10.5 K/uL   RBC 4.82 3.87 - 5.11 MIL/uL   Hemoglobin 14.2 12.0 - 15.0 g/dL   HCT 44.8 36.0 - 46.0 %   MCV 92.9 80.0 - 100.0 fL   MCH 29.5 26.0 - 34.0 pg   MCHC 31.7 30.0 - 36.0 g/dL   RDW 15.8 (H) 11.5 - 15.5 %   Platelets 203 150 - 400 K/uL   nRBC 0.0 0.0 - 0.2 %   Neutrophils Relative % 71 %   Neutro Abs 5.3 1.7 - 7.7 K/uL   Lymphocytes Relative 19 %   Lymphs Abs 1.5 0.7 - 4.0 K/uL   Monocytes Relative 7 %   Monocytes Absolute 0.5 0.1 - 1.0 K/uL   Eosinophils Relative 2 %   Eosinophils Absolute 0.2 0.0 - 0.5 K/uL   Basophils Relative 1 %   Basophils Absolute 0.0 0.0 - 0.1 K/uL   Immature Granulocytes 0 %   Abs Immature Granulocytes 0.02 0.00 - 0.07 K/uL    Comment: Performed at [facility], [place] [address] [place], [place] [address]  Comprehensive metabolic panel     Status: Abnormal   Collection Time: 07/31/18  3:34 AM  Result Value Ref Range   Sodium 139 135 - 145 mmol/L   Potassium 6.8 (HH) 3.5 - 5.1 mmol/L    Comment: SLIGHT HEMOLYSIS CRITICAL RESULT CALLED TO, READ BACK BY AND VERIFIED WITH: T HAMLETT RN 0408 07/31/18 A NAVARRO    Chloride 106 98 - 111 mmol/L   CO2 25 22 - 32 mmol/L   Glucose, Bld 101 (H) 70 - 99 mg/dL   BUN 22 8 - 23 mg/dL   Creatinine, Ser 0.87 0.44 - 1.00 mg/dL   Calcium 8.2 (L) 8.9 - 10.3 mg/dL   Total Protein 8.0 6.5 - 8.1 g/dL   Albumin 4.0 3.5 - 5.0 g/dL   AST 25 15 - 41 U/L   ALT 12 0 - 44 U/L   Alkaline Phosphatase 83 38 - 126 U/L   Total Bilirubin 0.7 0.3 - 1.2 mg/dL   GFR calc non Af Amer >60 >60 mL/min   GFR calc Af Amer >60 >60 mL/min   Anion gap 8 5 - 15    Comment: Performed at [facility], [place] [address], [place], [place] [address]   Potassium     Status: None   Collection Time: 07/31/18  5:21 AM  Result Value Ref Range   Potassium 4.4  3.5 - 5.1 mmol/L    Comment: DELTA CHECK NOTED REPEATED TO VERIFY Performed at [facility] [address], [place], [place] [address]   SARS Coronavirus 2 (CEPHEID - Performed in [facility] hospital lab), Hosp Order     Status: None   Collection Time: 07/31/18  6:34 AM  Result Value Ref Range   SARS Coronavirus 2 NEGATIVE NEGATIVE    Comment: (NOTE) If result is NEGATIVE SARS-CoV-2 target nucleic acids are NOT DETECTED. The SARS-CoV-2 RNA is generally detectable in upper and lower  respiratory specimens during the acute phase of infection. The lowest  concentration of SARS-CoV-2 viral copies this assay can detect is 250  copies / mL. A negative result does not preclude SARS-CoV-2 infection  and should not be used as the sole basis for treatment or other  patient management decisions.  A negative result may occur with  improper specimen collection / handling, submission of specimen other  than nasopharyngeal swab, presence of viral mutation(s) within the  areas targeted by this assay, and inadequate number of viral copies  (<250 copies / mL). A negative result must be combined with clinical  observations, patient history, and epidemiological information. If result is POSITIVE SARS-CoV-2 target nucleic acids are DETECTED. The SARS-CoV-2 RNA is generally detectable in upper and lower  respiratory specimens dur ing the acute phase of infection.  Positive  results are indicative of active infection with SARS-CoV-2.  Clinical  correlation with patient history and other diagnostic information is  necessary to determine patient infection status.  Positive results do  not rule out bacterial infection or co-infection with other viruses. If result is PRESUMPTIVE POSTIVE SARS-CoV-2 nucleic acids MAY BE PRESENT.   A presumptive positive result was obtained on the  submitted specimen  and confirmed on repeat testing.  While 2019 novel coronavirus  (SARS-CoV-2) nucleic acids may be present in the submitted sample  additional confirmatory testing may be necessary for epidemiological  and / or clinical management purposes  to differentiate between  SARS-CoV-2 and other Sarbecovirus currently known to infect humans.  If clinically indicated additional testing with an alternate test  methodology [phone]) is advised. The SARS-CoV-2 RNA is generally  detectable in upper and lower respiratory sp ecimens during the acute  phase of infection. The expected result is Negative. Fact Sheet for Patients:  StrictlyIdeas.no Fact Sheet for Healthcare Providers: BankingDealers.co.za This test is not yet approved or cleared by the [place] FDA and has been authorized for detection and/or diagnosis of SARS-CoV-2 by FDA under an Emergency Use Authorization (EUA).  This EUA will remain in effect (meaning this test can be used) for the duration of the COVID-19 declaration under Section 564(b)(1) of the Act, 21 U.S.C. section 360bbb-3(b)(1), unless the authorization is terminated or revoked sooner. Performed at [facility], [place] [address], [place], [place] [address]   Urinalysis, Routine w reflex microscopic     Status: Abnormal   Collection Time: 07/31/18  6:57 AM  Result Value Ref Range   Color, Urine YELLOW YELLOW   APPearance HAZY (A) CLEAR   Specific Gravity, Urine 1.015 1.005 - 1.030   pH 5.0 5.0 - 8.0   Glucose, UA NEGATIVE NEGATIVE mg/dL   Hgb urine dipstick MODERATE (A) NEGATIVE   Bilirubin Urine NEGATIVE NEGATIVE   Ketones, ur NEGATIVE NEGATIVE mg/dL   Protein, ur NEGATIVE NEGATIVE mg/dL   Nitrite POSITIVE (A) NEGATIVE   Leukocytes,Ua SMALL (A) NEGATIVE   RBC / HPF 21-50 0 -  5 RBC/hpf   WBC, UA 6-10 0 - 5 WBC/hpf   Bacteria, UA MANY (A) NONE SEEN   Squamous Epithelial / LPF 0-5 0  - 5   Mucus PRESENT     Comment: Performed at [facility], [place] [address], [place], [place] [address]  Heparin level (unfractionated)     Status: Abnormal   Collection Time: 07/31/18  9:34 AM  Result Value Ref Range   Heparin Unfractionated 1.14 (H) 0.30 - 0.70 IU/mL    Comment: RESULTS CONFIRMED BY MANUAL DILUTION (NOTE) If heparin results are below expected values, and patient dosage has  been confirmed, suggest follow up testing of antithrombin III levels. Performed at [facility], [place] [address] [place], [place] [address]   APTT     Status: None   Collection Time: 07/31/18  9:34 AM  Result Value Ref Range   aPTT 31 24 - 36 seconds    Comment: Performed at [facility], [place] [address], [place], [place] [address]   Dg Abdomen 1 View  Result Date: 07/31/2018 CLINICAL DATA:  Nasogastric tube placement EXAM: ABDOMEN - 1 VIEW COMPARISON:  CT abdomen and pelvis July 31, 2018. FINDINGS: Nasogastric tube tip and side port are in the proximal stomach. Visualized bowel is distended. No free air evident. Visualized lungs are clear. There is cardiomegaly. IMPRESSION: Nasogastric tube tip and side port in proximal stomach. Bowel dilatation persists. No free air. Cardiomegaly. Visualized lungs clear. Electronically Signed   By: Lowella Grip III M.D.   On: 07/31/2018 07:54   Ct Abdomen Pelvis W Contrast  Result Date: 07/31/2018 CLINICAL DATA:  Left lower quadrant abdominal pain beginning at 6 o'clock p.m. Previous small-bowel obstructions. EXAM: CT ABDOMEN AND PELVIS WITH CONTRAST TECHNIQUE: Multidetector CT imaging of the abdomen and pelvis was performed using the standard protocol following bolus administration of intravenous contrast. CONTRAST:  193mL OMNIPAQUE IOHEXOL 300 MG/ML  SOLN COMPARISON:  CT of the abdomen and pelvis 06/05/2017 at [facility]. CT of the abdomen and pelvis 10/03/2013 at [facility]. FINDINGS: Lower chest: Heart is enlarged, particularly the right atrium. This is stable. No significant pleural or pericardial effusion is present. Mild dependent atelectasis is present at both lung bases. Hepatobiliary: Moderate intra and extrahepatic biliary dilation is present. The patient is status post cholecystectomy. No obstructing lesion is present. No discrete hepatic lesions are present. Pancreas: Unremarkable. No pancreatic ductal dilatation or surrounding inflammatory changes. Spleen: Normal in size without focal abnormality. Adrenals/Urinary Tract: The adrenal glands are normal bilaterally. A 10 mm nonobstructing stone is present at the lower pole of the right kidney. Additional punctate nonobstructing stone is present at the upper pole of the right kidney. No other mass lesion is present. There is no obstruction. Ureters are normal. The urinary bladder is decompressed. 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 Small bowel is diffusely dilated. There is marked dilation in the right upper quadrant at the level of a previous anastomosis, just proximal to the colon. Colon is decompressed. Appendix is visualized and normal. Vascular/Lymphatic: No significant vascular calcifications or adenopathy is present Reproductive: Calcified fibroid uterus is again noted. Adnexa are unremarkable. Other: Minimal free fluid is present within the anatomic pelvis. Musculoskeletal: Stable grade 1 anterolisthesis is present at L4-5. Vertebral body heights and alignment otherwise normal. Mild leftward curvature is present. Bony pelvis is intact. Hips are located and within normal limits. IMPRESSION: 1. Small bowel obstruction. The transition point is in  the right upper quadrant likely due to adhesions or related to previous anastomosis. 2. No focal perforation. 3. There is some free fluid within the anatomic pelvis. 4. Nonobstructing 10 mm right renal calculus. 5. Moderate intra and extrahepatic  biliary dilation without significant interval change. No focal etiology is evident. Electronically Signed   By: [place] Morelle M.D.   On: 07/31/2018 06:21    Pending Labs Unresulted Labs (From admission, onward)    Start     Ordered   08/01/18 0500  CBC  Tomorrow morning,   R     07/31/18 1329   08/01/18 0500  Comprehensive metabolic panel  Tomorrow morning,   R     07/31/18 1329   08/01/18 0500  CBC  Daily,   R     07/31/18 0833   08/01/18 7322  Basic metabolic panel  Tomorrow morning,   R     07/31/18 0937   08/01/18 0000  CBC  Tomorrow morning,   R     07/31/18 0937   07/31/18 1500  Heparin level (unfractionated)  Once-Timed,   R     07/31/18 0838   07/31/18 1500  APTT  Once-Timed,   R     07/31/18 0838   07/31/18 1330  HIV antibody (Routine Testing)  Once,   R     07/31/18 1329          Vitals/Pain Today's Vitals   07/31/18 1358 07/31/18 1405 07/31/18 1432 07/31/18 1537  BP: 107/79     Pulse: 86     Resp: (!) 22     Temp:      TempSrc:      SpO2: (!) 87%     Weight:      Height:      PainSc:  6  6  0-No pain    Isolation Precautions No active isolations  Medications Medications  acetaminophen (TYLENOL) tablet 650 mg (has no administration in time range)    Or  acetaminophen (TYLENOL) suppository 650 mg (has no administration in time range)  ondansetron (ZOFRAN) tablet 4 mg (has no administration in time range)    Or  ondansetron (ZOFRAN) injection 4 mg (has no administration in time range)  HYDROmorphone (DILAUDID) injection 2 mg (2 mg Intravenous Given 07/31/18 1352)  metoprolol tartrate (LOPRESSOR) injection 5 mg (5 mg Intravenous Given 07/31/18 1351)  levothyroxine (SYNTHROID, LEVOTHROID) injection 100 mcg (100 mcg Intravenous Given 07/31/18 1437)  levETIRAcetam (KEPPRA) IVPB 500 mg/100 mL premix (0 mg Intravenous Stopped 07/31/18 1409)  0.9 %  sodium chloride infusion (has no administration in time range)  heparin ADULT infusion 100 units/mL (25000  units/236mL sodium chloride 0.45%) (1,250 Units/hr Intravenous New Bag/Given 07/31/18 0952)  fentaNYL (SUBLIMAZE) injection 50 mcg (50 mcg Intravenous Given 07/31/18 0419)  ondansetron (ZOFRAN) injection 4 mg (4 mg Intravenous Given 07/31/18 0419)  sodium chloride (PF) 0.9 % injection (  Given by Other 07/31/18 0529)  iohexol (OMNIPAQUE) 300 MG/ML solution 100 mL (100 mLs Intravenous Contrast Given 07/31/18 0529)  fentaNYL (SUBLIMAZE) injection 100 mcg (100 mcg Intravenous Given 07/31/18 0634)  diatrizoate meglumine-sodium (GASTROGRAFIN) 66-10 % solution 90 mL (90 mLs Per NG tube Given 07/31/18 1045)    Mobility walks with person assist Low fall risk   Focused Assessments Abdominal    R Recommendations: See Admitting Provider Note  Report given to:   Additional Notes:

## 2018-07-31 NOTE — Progress Notes (Addendum)
Pt has poor venous access, IV team called to restart IV, Pt had 2 IV in the ED and infiltrated and pulled out. Lab having difficult drawing labs. Pt Pulled out NGT in ED, attempted to re-insert 16 [place] unsuccessful. Rt and left nostril possible swollen, unable to insert 16 Fr. Will report off to oncoming RN to attempt re-insertion with 14 [place]. Pt cooperative and tearful. Pt has only one IV access with Heparin infusing in rt forearm as ordered. MD updated on IV status and clarified NGT re-insertion. SRP, RN

## 2018-07-31 NOTE — Progress Notes (Signed)
Provider aware, dilaudid dose changed and O2 continued.

## 2018-07-31 NOTE — ED Notes (Signed)
Walked in to prepare pt for transport to floor. Pt had removed NG tube in her sleep.

## 2018-07-31 NOTE — ED Notes (Signed)
Suction restarted on patient's NG, one hour post Gastrografin.

## 2018-08-01 ENCOUNTER — Inpatient Hospital Stay (HOSPITAL_COMMUNITY): Payer: Medicare Other

## 2018-08-01 DIAGNOSIS — R569 Unspecified convulsions: Secondary | ICD-10-CM

## 2018-08-01 DIAGNOSIS — M797 Fibromyalgia: Secondary | ICD-10-CM

## 2018-08-01 DIAGNOSIS — G894 Chronic pain syndrome: Secondary | ICD-10-CM

## 2018-08-01 DIAGNOSIS — E890 Postprocedural hypothyroidism: Secondary | ICD-10-CM

## 2018-08-01 DIAGNOSIS — I5032 Chronic diastolic (congestive) heart failure: Secondary | ICD-10-CM

## 2018-08-01 DIAGNOSIS — I482 Chronic atrial fibrillation, unspecified: Secondary | ICD-10-CM

## 2018-08-01 DIAGNOSIS — I272 Pulmonary hypertension, unspecified: Secondary | ICD-10-CM

## 2018-08-01 LAB — APTT
aPTT: 192 seconds (ref 24–36)
aPTT: >200 s (ref 24–36)
aPTT: 112 s — ABNORMAL HIGH (ref 24–36)

## 2018-08-01 LAB — COMPREHENSIVE METABOLIC PANEL
ALT: 16 U/L (ref 0–44)
AST: 24 U/L (ref 15–41)
Albumin: 3.7 g/dL (ref 3.5–5.0)
Alkaline Phosphatase: 84 U/L (ref 38–126)
Anion gap: 12 (ref 5–15)
BUN: 17 mg/dL (ref 8–23)
CO2: 20 mmol/L — ABNORMAL LOW (ref 22–32)
Calcium: 7.5 mg/dL — ABNORMAL LOW (ref 8.9–10.3)
Chloride: 109 mmol/L (ref 98–111)
Creatinine, Ser: 0.99 mg/dL (ref 0.44–1.00)
GFR calc Af Amer: >60 mL/min (ref >60)
GFR calc non Af Amer: 59 mL/min — ABNORMAL LOW (ref >60)
Glucose, Bld: 83 mg/dL (ref 70–99)
Potassium: 4.5 mmol/L (ref 3.5–5.1)
Sodium: 141 mmol/L (ref 135–145)
Total Bilirubin: 0.5 mg/dL (ref 0.3–1.2)
Total Protein: 7.4 g/dL (ref 6.5–8.1)

## 2018-08-01 LAB — CBC
HCT: 42.3 % (ref 36.0–46.0)
Hemoglobin: 12.3 g/dL (ref 12.0–15.0)
MCH: 28.6 pg (ref 26.0–34.0)
MCHC: 29.1 g/dL — ABNORMAL LOW (ref 30.0–36.0)
MCV: 98.4 fL (ref 80.0–100.0)
Platelets: 166 10*3/uL (ref 150–400)
RBC: 4.3 MIL/uL (ref 3.87–5.11)
RDW: 16.4 % — ABNORMAL HIGH (ref 11.5–15.5)
WBC: 7.6 10*3/uL (ref 4.0–10.5)
nRBC: 0.4 % — ABNORMAL HIGH (ref 0.0–0.2)

## 2018-08-01 LAB — HEPARIN LEVEL (UNFRACTIONATED): Heparin Unfractionated: 1.38 IU/mL — ABNORMAL HIGH (ref 0.30–0.70)

## 2018-08-01 LAB — HIV ANTIBODY (ROUTINE TESTING W REFLEX): HIV Screen 4th Generation wRfx: NONREACTIVE

## 2018-08-01 MED ORDER — HEPARIN (PORCINE) 25000 UT/250ML-% IV SOLN
900.0000 [IU]/h | INTRAVENOUS | Status: AC
  Administered 2018-08-01 – 2018-08-02 (×2): 900 [IU]/h via INTRAVENOUS
  Filled 2018-08-01: qty 250

## 2018-08-01 MED ORDER — HEPARIN (PORCINE) 25000 UT/250ML-% IV SOLN
1000.0000 [IU]/h | INTRAVENOUS | Status: DC
  Administered 2018-08-01: 1000 [IU]/h via INTRAVENOUS

## 2018-08-01 NOTE — Progress Notes (Signed)
PROGRESS NOTE  Jillian Eaton GGY:[id number] DOB: 08-05-1950 DOA: 07/31/2018 PCP: [name], MD  HPI/Recap of past 24 hours: HPI from Dr Eliott Nine Harari is a 68 y.o. 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 Pt denies any diarrhea, no fevers or chills. In the ED, CT abdomen pelvis was notable for small bowel obstruction with transition point in the right upper quadrant. Pt admitted for SBO and general surgery was consulted.   Today, patient still reporting abdominal cramping, tried clear liquid of which she did not tolerate this morning.  Later today had a BM unable to somewhat keep her clear liquids down.  General surgery on board, following patient closely.  Assessment/Plan: Principal Problem:   SBO (small bowel obstruction) (HCC) Active Problems:   Morbid obesity with body mass index of 40.0-49.9 (HCC)   Pulmonary hypertension (HCC)   Seizures (HCC)   Chronic atrial fibrillation   Chronic pain syndrome   Fibromyalgia   GERD (gastroesophageal reflux disease)   Postoperative hypothyroidism   On anticoagulant therapy   Chronic diastolic CHF (congestive heart failure) (HCC)   CKD (chronic kidney disease) stage 3, GFR 30-59 ml/min (HCC)   SBO (small bowel obstruction) Likely secondary to multiple prior  surgeries, adhesions General surgery on board: NG tube, small bowel protocol, gentle IV fluids, antiemetics, supportive care Monitor closely  ??UTI UA positive for nitrites, leukocytes, WBC No known complaints Ordered UC pending No antibiotics for now  Paroxysmal atrial fibrillation PO metoprolol changed to IV Hold Eliquis, use IV heparin in the interim  Chronic diastolic CHF  Appears clinically euvolemic Held Lasix, gentle IV fluids, once tolerating orally Monitor closely  Seizures Continue Keppra changed to IV  Chronic pain syndrome/fibro-myalgia Holding tramadol, Flexeril for now  Postoperative hypothyroidism Change Synthroid to IV  Morbid obesity Advised lifestyle modification         Malnutrition Type:      Malnutrition Characteristics:      Nutrition Interventions:       Estimated body mass index is 43.36 kg/m as calculated from the following:   Height as of this encounter: 5\' 5"  (1.651 m).   Weight as of this encounter: 118.2 kg.     Code Status: Full  Family Communication: None at bedside  Disposition Plan: Once significant clinical improvement   Consultants:  General surgery  Procedures:  None  Antimicrobials:  None  DVT prophylaxis: IV Heparin   Objective: Vitals:   07/31/18 1739 07/31/18 2112 08/01/18 0458 08/01/18 1432  BP: 119/81 116/64 121/85 117/64  Pulse: 89 (!) 101 98 (!) 110  Resp: 18 18 12    Temp: 98 F (36.7 C) 97.7 F (36.5 C) 98.2 F (36.8 C) 98.6 F (37 C)  TempSrc: Oral Oral Oral Oral  SpO2: 91% 95% 100% 100%  Weight: 118.2 kg     Height: 5\' 5"  (1.651 m)       Intake/Output Summary (Last 24 hours) at 08/01/2018 1500  Last data filed at 08/01/2018 0600 Gross per 24 hour  Intake 1999.12 ml  Output 550 ml  Net 1449.12 ml   Filed Weights   07/31/18 0137 07/31/18 1739  Weight: 117.9 kg 118.2 kg    Exam:  General: NAD   Cardiovascular: S1, S2 present  Respiratory: CTAB  Abdomen:  Soft, mildly tender, obese, hypoactive bowel sounds  Musculoskeletal: No bilateral pedal edema noted  Skin: Normal  Psychiatry: Normal mood   Data Reviewed: CBC: Recent Labs  Lab 07/31/18 0334 08/01/18 0558  WBC 7.5 7.6  NEUTROABS 5.3  --   HGB 14.2 12.3  HCT 44.8 42.3  MCV 92.9 98.4  PLT 203 229   Basic Metabolic Panel: Recent Labs  Lab 07/31/18 0334 07/31/18 0521 08/01/18 0558  NA 139  --  141  K 6.8* 4.4 4.5  CL 106  --  109  CO2 25  --  20*  GLUCOSE 101*  --  83  BUN 22  --  17  CREATININE 0.87  --  0.99  CALCIUM 8.2*  --  7.5*   GFR: Estimated Creatinine Clearance: 71.9 mL/min (by C-G formula based on SCr of 0.99 mg/dL). Liver Function Tests: Recent Labs  Lab 07/31/18 0334 08/01/18 0558  AST 25 24  ALT 12 16  ALKPHOS 83 84  BILITOT 0.7 0.5  PROT 8.0 7.4  ALBUMIN 4.0 3.7   No results for input(s): LIPASE, AMYLASE in the last 168 hours. No results for input(s): AMMONIA in the last 168 hours. Coagulation Profile: No results for input(s): INR, PROTIME in the last 168 hours. Cardiac Enzymes: No results for input(s): CKTOTAL, CKMB, CKMBINDEX, TROPONINI in the last 168 hours. BNP (last 3 results) No results for input(s): PROBNP in the last 8760 hours. HbA1C: No results for input(s): HGBA1C in the last 72 hours. CBG: No results for input(s): GLUCAP in the last 168 hours. Lipid Profile: No results for input(s): CHOL, HDL, LDLCALC, TRIG, CHOLHDL, LDLDIRECT in the last 72 hours. Thyroid Function Tests: No results for input(s): TSH, T4TOTAL, FREET4, T3FREE, THYROIDAB in the last 72 hours. Anemia Panel: No results for input(s): VITAMINB12, FOLATE, FERRITIN, TIBC, IRON, RETICCTPCT in the last 72 hours. Urine analysis:    Component Value Date/Time   COLORURINE YELLOW 07/31/2018 0657   APPEARANCEUR HAZY (A) 07/31/2018 0657   LABSPEC 1.015 07/31/2018 0657   PHURINE 5.0 07/31/2018 0657   GLUCOSEU NEGATIVE 07/31/2018 0657   HGBUR MODERATE (A) 07/31/2018  0657   BILIRUBINUR NEGATIVE 07/31/2018 0657   KETONESUR NEGATIVE 07/31/2018 0657   PROTEINUR NEGATIVE 07/31/2018 0657   UROBILINOGEN 1.0 06/06/2013 2154   NITRITE POSITIVE (A) 07/31/2018 0657   LEUKOCYTESUR SMALL (A) 07/31/2018 0657   Sepsis Labs: @LABRCNTIP (procalcitonin:4,lacticidven:4)  ) Recent Results (from the past 240 hour(s))  SARS Coronavirus 2 (CEPHEID - Performed in [place] hospital lab), Hosp Order     Status: None   Collection Time: 07/31/18  6:34 AM   Specimen: Nasopharyngeal Swab  Result Value Ref Range Status   SARS Coronavirus 2 NEGATIVE NEGATIVE Final    Comment: (NOTE) If result is NEGATIVE SARS-CoV-2 target nucleic acids are NOT DETECTED. The SARS-CoV-2 RNA is generally detectable in upper and lower  respiratory specimens during the acute phase of infection. The lowest  concentration of SARS-CoV-2 viral copies this assay can detect is 250  copies / mL. A negative result does not preclude SARS-CoV-2 infection  and should not be used as the sole basis for treatment or other  patient management  decisions.  A negative result may occur with  improper specimen collection / handling, submission of specimen other  than nasopharyngeal swab, presence of viral mutation(s) within the  areas targeted by this assay, and inadequate number of viral copies  (<250 copies / mL). A negative result must be combined with clinical  observations, patient history, and epidemiological information. If result is POSITIVE SARS-CoV-2 target nucleic acids are DETECTED. The SARS-CoV-2 RNA is generally detectable in upper and lower  respiratory specimens dur ing the acute phase of infection.  Positive  results are indicative of active infection with SARS-CoV-2.  Clinical  correlation with patient history and other diagnostic information is  necessary to determine patient infection status.  Positive results do  not rule out bacterial infection or co-infection with other viruses. If  result is PRESUMPTIVE POSTIVE SARS-CoV-2 nucleic acids MAY BE PRESENT.   A presumptive positive result was obtained on the submitted specimen  and confirmed on repeat testing.  While 2019 novel coronavirus  (SARS-CoV-2) nucleic acids may be present in the submitted sample  additional confirmatory testing may be necessary for epidemiological  and / or clinical management purposes  to differentiate between  SARS-CoV-2 and other Sarbecovirus currently known to infect humans.  If clinically indicated additional testing with an alternate test  methodology [phone]) is advised. The SARS-CoV-2 RNA is generally  detectable in upper and lower respiratory sp ecimens during the acute  phase of infection. The expected result is Negative. Fact Sheet for Patients:  StrictlyIdeas.no Fact Sheet for Healthcare Providers: BankingDealers.co.za This test is not yet approved or cleared by the [place] FDA and has been authorized for detection and/or diagnosis of SARS-CoV-2 by FDA under an Emergency Use Authorization (EUA).  This EUA will remain in effect (meaning this test can be used) for the duration of the COVID-19 declaration under Section 564(b)(1) of the Act, 21 U.S.C. section 360bbb-3(b)(1), unless the authorization is terminated or revoked sooner. Performed at [facility], [place] [address] [place], [place] [address]       Studies: Dg Abd 1 View  Result Date: 08/01/2018 CLINICAL DATA:  NG tube placement. EXAM: ABDOMEN - 1 VIEW COMPARISON:  One-view abdomen 07/31/2018 FINDINGS: Tip the NG tube is at the suture line of the gastrectomy. Side port is just above this level. Dilated loops of bowel are again noted. IMPRESSION: 1. Side port of the NG tube is just above the GE junction. Electronically Signed   By: San Morelle M.D.   On: 08/01/2018 08:38   Dg Abd Portable 1v-small Bowel Obstruction Protocol-initial, 8 Hr Delay   Result Date: 07/31/2018 CLINICAL DATA:  Small-bowel obstruction. EXAM: PORTABLE ABDOMEN - 1 VIEW COMPARISON:  07/31/2018 FINDINGS: Oral contrast is noted throughout the colon, to the level of the sigmoid colon and likely the rectum. IV contrast is noted in the patient's urinary bladder from prior contrast enhanced study. There is some gaseous distention of loops of colon and small bowel scattered throughout the abdomen. IMPRESSION: Oral contrast seen to the level of the sigmoid colon/rectum. Electronically Signed   By: Constance Holster M.D.   On: 07/31/2018 19:18    Scheduled Meds: . bisacodyl  10 mg Rectal Daily  . levothyroxine  100 mcg Intravenous Daily  . lip balm  1 application Topical BID  . metoprolol tartrate  5 mg Intravenous Q6H  . ondansetron (ZOFRAN) IV  4 mg Intravenous Once    Continuous Infusions: . sodium chloride 75 mL/hr at 08/01/18 1242  .  heparin 1,000 Units/hr (08/01/18 1311)  . lactated ringers    . levETIRAcetam 500 mg (08/01/18 1245)  . ondansetron (ZOFRAN) IV       LOS: 1 day     Alma Friendly, MD [facility]  If 7PM-7AM, please contact night-coverage [email or web address] 08/01/2018, 3:00 PM

## 2018-08-01 NOTE — Progress Notes (Signed)
[facility] Progress Note     Subjective: CC-  Patient is very groggy this morning. States that she does still have some central abdominal pain. Minimal improvement from yesterday. No n/v. States that she had a large BM. Does not think that she is passing any flatus.  Xray last night showed contrast in the sigmoid colon/rectum. NG tube was clamped around 0300 this AM. She has not had any liquids to drink yet.  Objective: Vital signs in last 24 hours: Temp:  [97.7 F (36.5 C)-98.2 F (36.8 C)] 98.2 F (36.8 C) (06/11 0458) Pulse Rate:  [86-107] 98 (06/11 0458) Resp:  [12-22] 12 (06/11 0458) BP: (107-136)/(64-93) 121/85 (06/11 0458) SpO2:  [87 %-100 %] 100 % (06/11 0458) Weight:  [099.8 kg] 118.2 kg (06/10 1739) Last BM Date: 07/30/18  Intake/Output from previous day: 06/10 0701 - 06/11 0700 In: 2099.1 [I.V.:898.3; IV Piggyback:1200.8] Out: 551 [Urine:550; Stool:1] Intake/Output this shift: No intake/output data recorded.  PE: Gen:  Alert but drowsy, NAD HEENT: EOM's intact, pupils equal and round Card:  irregular Pulm:  CTAB, no W/R/R, effort normal Abd: obese, soft, +BS, no HSM, subjective epigastric/central abdominal TTP without peritonitis Ext:  Calves soft and nontender Skin: no rashes noted, warm and dry  Lab Results:  Recent Labs    07/31/18 0334 08/01/18 0558  WBC 7.5 7.6  HGB 14.2 12.3  HCT 44.8 42.3  PLT 203 166   BMET Recent Labs    07/31/18 0334 07/31/18 0521 08/01/18 0558  NA 139  --  141  K 6.8* 4.4 4.5  CL 106  --  109  CO2 25  --  20*  GLUCOSE 101*  --  83  BUN 22  --  17  CREATININE 0.87  --  0.99  CALCIUM 8.2*  --  7.5*   PT/INR No results for input(s): LABPROT, INR in the last 72 hours. CMP     Component Value Date/Time   NA 141 08/01/2018 0558   NA 146 (H) 06/19/2017 1222   K 4.5 08/01/2018 0558   CL 109 08/01/2018 0558   CO2 20 (L) 08/01/2018 0558   GLUCOSE 83 08/01/2018 0558   BUN 17 08/01/2018 0558   BUN  21 06/19/2017 1222   CREATININE 0.99 08/01/2018 0558   CREATININE 1.01 (H) 11/27/2017 1029   CALCIUM 7.5 (L) 08/01/2018 0558   CALCIUM 5.5 (LL) 07/10/2017 2104   PROT 7.4 08/01/2018 0558   PROT 6.8 06/19/2017 1222   ALBUMIN 3.7 08/01/2018 0558   ALBUMIN 3.8 06/19/2017 1222   AST 24 08/01/2018 0558   ALT 16 08/01/2018 0558   ALKPHOS 84 08/01/2018 0558   BILITOT 0.5 08/01/2018 0558   BILITOT 0.4 06/19/2017 1222   GFRNONAA 59 (L) 08/01/2018 0558   GFRNONAA 58 (L) 11/27/2017 1029   GFRAA >60 08/01/2018 0558   GFRAA 68 11/27/2017 1029   Lipase  No results found for: LIPASE     Studies/Results: Dg Abdomen 1 View  Result Date: 07/31/2018 CLINICAL DATA:  Nasogastric tube placement EXAM: ABDOMEN - 1 VIEW COMPARISON:  CT abdomen and pelvis July 31, 2018. FINDINGS: Nasogastric tube tip and side port are in the proximal stomach. Visualized bowel is distended. No free air evident. Visualized lungs are clear. There is cardiomegaly. IMPRESSION: Nasogastric tube tip and side port in proximal stomach. Bowel dilatation persists. No free air. Cardiomegaly. Visualized lungs clear. Electronically Signed   By: Lowella Grip III M.D.   On: 07/31/2018 07:54   Ct Abdomen  Pelvis W Contrast  Result Date: 07/31/2018 CLINICAL DATA:  Left lower quadrant abdominal pain beginning at 6 o'clock p.m. Previous small-bowel obstructions. EXAM: CT ABDOMEN AND PELVIS WITH CONTRAST TECHNIQUE: Multidetector CT imaging of the abdomen and pelvis was performed using the standard protocol following bolus administration of intravenous contrast. CONTRAST:  138mL OMNIPAQUE IOHEXOL 300 MG/ML  SOLN COMPARISON:  CT of the abdomen and pelvis 06/05/2017 at [facility]. CT of the abdomen and pelvis 10/03/2013 at [facility]. FINDINGS: Lower chest: Heart is enlarged, particularly the right atrium. This is stable. No significant pleural or pericardial effusion is present. Mild dependent atelectasis  is present at both lung bases. Hepatobiliary: Moderate intra and extrahepatic biliary dilation is present. The patient is status post cholecystectomy. No obstructing lesion is present. No discrete hepatic lesions are present. Pancreas: Unremarkable. No pancreatic ductal dilatation or surrounding inflammatory changes. Spleen: Normal in size without focal abnormality. Adrenals/Urinary Tract: The adrenal glands are normal bilaterally. A 10 mm nonobstructing stone is present at the lower pole of the right kidney. Additional punctate nonobstructing stone is present at the upper pole of the right kidney. No other mass lesion is present. There is no obstruction. Ureters are normal. The urinary bladder is decompressed. 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 Small bowel is diffusely dilated. There is marked dilation in the right upper quadrant at the level of a previous anastomosis, just proximal to the colon. Colon is decompressed. Appendix is visualized and normal. Vascular/Lymphatic: No significant vascular calcifications or adenopathy is present Reproductive: Calcified fibroid uterus is again noted. Adnexa are unremarkable. Other: Minimal free fluid is present within the anatomic pelvis. Musculoskeletal: Stable grade 1 anterolisthesis is present at L4-5. Vertebral body heights and alignment otherwise normal. Mild leftward curvature is present. Bony pelvis is intact. Hips are located and within normal limits. IMPRESSION: 1. Small bowel obstruction. The transition point is in the right upper quadrant likely due to adhesions or related to previous anastomosis. 2. No focal perforation. 3. There is some free fluid within the anatomic pelvis. 4. Nonobstructing 10 mm right renal calculus. 5. Moderate intra and extrahepatic biliary dilation without significant interval change. No focal etiology is evident. Electronically Signed   By: San Morelle M.D.   On: 07/31/2018 06:21   Dg Abd Portable 1v-small Bowel  Obstruction Protocol-initial, 8 Hr Delay  Result Date: 07/31/2018 CLINICAL DATA:  Small-bowel obstruction. EXAM: PORTABLE ABDOMEN - 1 VIEW COMPARISON:  07/31/2018 FINDINGS: Oral contrast is noted throughout the colon, to the level of the sigmoid colon and likely the rectum. IV contrast is noted in the patient's urinary bladder from prior contrast enhanced study. There is some gaseous distention of loops of colon and small bowel scattered throughout the abdomen. IMPRESSION: Oral contrast seen to the level of the sigmoid colon/rectum. Electronically Signed   By: Constance Holster M.D.   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  Hx cholecystectomy/tubal ligation/renal calculus extraction x2 -CT 6/10 showed SBO with transition point in the RUQ likely due to adhesions or related to previous anastomosis -small bowel protocol>> contrast in colon  FEN: N.p.o./NG tube/IV fluids  ID: None DVT: IV heparin Follow-up: To be to determined  POC:   Strey,Theodore Spouse   [phone]  [place] Niece [phone]    hough,cyara Friend   [phone]   Plan: Small bowel protocol delayed film showed contrast in colon and patient had a large BM. She is still having some abdominal pain. Keep NG tube clamped and allow her to sip on clears today. Continue to hold eliquis today. May be able to d/c NG tube later today if tolerating clamping trial. Needs to  mobilize.   LOS: 1 day    Wellington Hampshire , [facility] 08/01/2018, 8:32 AM Pager: [phone] Mon-Thurs 7:00 am-4:30 pm Fri 7:00 am -11:30 AM Sat-Sun 7:00 am-11:30 am

## 2018-08-01 NOTE — TOC Initial Note (Signed)
Transition of Care [facility]) - Initial/Assessment Note    Patient Details  Name: Jillian Eaton MRN: [id number] Date of Birth: 1950-07-28  Transition of Care [facility]) CM/SW Contact:    Purcell Mouton, RN Phone Number: 08/01/2018, 3:26 PM  Clinical Narrative:     Pt admitted with abdominal pain and vomiting.  Pt dx with CHF.           Patient Goals and CMS Choice Patient states their goals for this hospitalization and ongoing recovery are:: To get better      Expected Discharge Plan and Services     Discharge Planning Services: CM Consult   Living arrangements for the past 2 months: Single Family Home Expected Discharge Date: (unknown)                                    Prior Living Arrangements/Services Living arrangements for the past 2 months: Single Family Home Lives with:: Adult Children Patient language and need for interpreter reviewed:: No Do you feel safe going back to the place where you live?: Yes               Activities of Daily Living Home Assistive Devices/Equipment: Bedside commode/3-in-1, Cane (specify quad or straight), CBG Meter, Walker (specify type), Grab bars in shower, Hand-held shower hose, Sock aid, Reacher, Long-handled shoehorn, Other (Comment)(upper partial plate, front wheeled walker, single point cane, walk-in shower) ADL Screening (condition at time of admission) Patient's cognitive ability adequate to safely complete daily activities?: No Is the patient deaf or have difficulty hearing?: No Does the patient have difficulty seeing, even when wearing glasses/contacts?: No Does the patient have difficulty concentrating, remembering, or making decisions?: Yes Patient able to express need for assistance with ADLs?: Yes Does the patient have difficulty dressing or bathing?: No Independently performs ADLs?: Yes (appropriate for developmental age) Does the patient have difficulty walking or climbing stairs?: Yes(secondary to  weakness) Weakness of Legs: Both Weakness of Arms/Hands: None  Permission Sought/Granted Permission sought to share information with : Case Manager                Emotional Assessment Appearance:: Appears stated age     Orientation: : Oriented to Self, Oriented to Place, Oriented to  Time, Oriented to Situation      Admission diagnosis:  Small bowel obstruction ([place]) [K56.609] SBO (small bowel obstruction) ([place]) [K56.609] Encounter for imaging study to confirm nasogastric (NG) tube placement [Z01.89] Patient Active Problem List   Diagnosis Date Noted  . Degenerative joint disease involving multiple joints on both sides of body 07/31/2018  . Rotator cuff tear arthropathy of right shoulder 04/05/2018  . Overactive bladder 04/05/2018  . Steroid-induced hyperglycemia   . Generalized OA   . 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 NICM (nonischemic cardiomyopathy) ([place]) 01/15/2018  . Osteoporosis 12/13/2017  . Chronic diastolic CHF (congestive heart failure) ([place]) 09/28/2017  . CKD (chronic kidney disease) stage 3, GFR 30-59 ml/min (HCC) 09/07/2017  . 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[place]) 08/03/2017  . Hypercapnemia 07/13/2017  . AKI (acute kidney injury) ([place]) 07/10/2017  . Vertigo 07/08/2017  . On anticoagulant therapy 06/19/2017  . GERD (gastroesophageal reflux disease) 08/28/2016  . 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 Depressive disorder 08/28/2016  . Iatrogenic hypocalcemia 08/28/2016  . Chronic pain syndrome   . Acute diastolic (congestive) heart failure ([place])   . Oropharyngeal dysphagia   . Chronic atrial fibrillation 04/28/2016  .  Vocal cord dysfunction 04/28/2016  . Thrombocytopenia ([place]) 04/28/2016  . Seizures ([place])   . Preoperative cardiovascular examination   . 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  . Leg swelling 10/19/2015  . Acute on chronic respiratory failure with hypoxia and hypercapnia ([place]) 08/03/2015  . Bilateral leg edema  05/11/2015  . Essential hypertension 05/11/2015  . Primary osteoarthritis of both knees 03/24/2014  . Chronic asthmatic bronchitis ([place]) 07/30/2013  . Bariatric surgery status 07/30/2013  . 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 Urge incontinence of urine 07/30/2013  . Vitamin D deficiency 07/30/2013  . SBO (small bowel obstruction) ([place]) 06/07/2013  . Pulmonary hypertension ([place]) 01/08/2013  . Insomnia 03/22/2012  . Mild intermittent asthma with acute exacerbation 03/22/2012  . Fibromyalgia 10/26/2011  . Anemia of chronic disease 03/28/2011  . Morbid obesity with body mass index of 40.0-49.9 ([place]) 09/22/2010   PCP:  [name], MD Pharmacy:   [id number] - [place], [place]. AT [address] [place] [address] Phone: [phone] Fax: [phone]  [facility] [place] [place] [facility] [address] [place] [address] Phone: [phone] Fax: [phone]     Social Determinants of Health (SDOH) Interventions    Readmission Risk Interventions Readmission Risk Prevention Plan 05/30/2018 05/28/2018  Transportation Screening - Complete  PCP or Specialist Appt within 3-5 Days Complete -  HRI or [place] - Complete  Social Work Consult for [place] Planning/Counseling - Not Complete  SW consult not completed comments - no need  Palliative Care Screening - Not Applicable  Medication Review (RN Care Manager) Referral to Pharmacy -  Some recent data might be hidden

## 2018-08-01 NOTE — Progress Notes (Addendum)
[place] for heparin Indication: hx atrial fibrillation (home Eliquis on hold)  Allergies  Allergen Reactions  . Hydrocodone Itching    Patient Measurements: Height: 5\' 5"  (165.1 cm) Weight: 260 lb 9.3 oz (118.2 kg) IBW/kg (Calculated) : 57 Heparin Dosing Weight: 85 kg  Vital Signs: Temp: 98.6 F (37 C) (06/11 1432) Temp Source: Oral (06/11 1432) BP: 117/64 (06/11 1432) Pulse Rate: 110 (06/11 1432)  Labs: Recent Labs    07/31/18 0334  07/31/18 0934 07/31/18 1706 08/01/18 0558 08/01/18 1047 08/01/18 1939  HGB 14.2  --   --   --  12.3  --   --   HCT 44.8  --   --   --  42.3  --   --   PLT 203  --   --   --  166  --   --   APTT  --    < > 31 79* 192* >200* 112*  HEPARINUNFRC  --   --  1.14* 1.18* 1.38*  --   --   CREATININE 0.87  --   --   --  0.99  --   --    < > = values in this interval not displayed.    Estimated Creatinine Clearance: 71.9 mL/min (by C-G formula based on SCr of 0.99 mg/dL).   Medications:  - on Eliquis 5 mg bid PTA (Last dose taken on 07/30/18 at noon)  Assessment: Patient's a 68 y.o F with hx SBO and afib on Eliquis PTA, presented to the ED on 6/10 with c/o abdominal pain, n/v.  Abdominal CT on 6/10 showed SBO.  Anticoagulation changed to heparin drip on 6/10 in case surgical intervention is needed.  Today, 08/01/2018: - Most recent aPTT still elevated but much improved after rate decrease to 1000 units/hr; heparin level remains elevated d/t recent DOAC - no bleeding or infusion issues per RN  Goal of Therapy:  Heparin level 0.3-0.7 units/ml aPTT 66-102 seconds Monitor platelets by anticoagulation protocol: Yes   Plan:  - Reduce heparin drip to 900 units hr - 6 hr aPTT - Daily heparin level and CBC - monitor for s/s bleeding  Jillian Eaton A 08/01/2018,9:40 PM

## 2018-08-01 NOTE — Progress Notes (Signed)
[facility] for heparin Indication: hx atrial fibrillation (home Eliquis on hold)  Allergies  Allergen Reactions  . Hydrocodone Itching    Patient Measurements: Height: 5\' 5"  (165.1 cm) Weight: 260 lb 9.3 oz (118.2 kg) IBW/kg (Calculated) : 57 Heparin Dosing Weight: 85 kg  Vital Signs: Temp: 98.2 F (36.8 C) (06/11 0458) Temp Source: Oral (06/11 0458) BP: 121/85 (06/11 0458) Pulse Rate: 98 (06/11 0458)  Labs: Recent Labs    07/31/18 0334 07/31/18 0934 07/31/18 1706 08/01/18 0558  HGB 14.2  --   --  12.3  HCT 44.8  --   --  42.3  PLT 203  --   --  166  APTT  --  31 79* 192*  HEPARINUNFRC  --  1.14* 1.18* 1.38*  CREATININE 0.87  --   --  0.99    Estimated Creatinine Clearance: 71.9 mL/min (by C-G formula based on SCr of 0.99 mg/dL).   Medications:  - on Eliquis 5 mg bid PTA (Last dose taken on 07/30/18 at noon)  Assessment: Patient's a 68 y.o F with hx SBO and afib on Eliquis PTA, presented to the ED on 6/10 with c/o abdominal pain, n/v.  Abdominal CT on 6/10 showed SBO.  Anticoagulation changed to heparin drip on 6/10 in case surgical intervention is needed.  Today, 08/01/2018: - Heparin level remains elevated; aPTT was elevated at 192 secs with lab drawn at ~6a; re-checked aPTT to confirm this is accurate and it came back >200 sec.  Verified with pt's RN-- heparin infusing in right arm and lab was drawn on the left arm. Per pt's RN, no bleeding noted - cbc ok   Goal of Therapy:  Heparin level 0.3-0.7 units/ml aPTT 66-102 seconds Monitor platelets by anticoagulation protocol: Yes   Plan:  - hold heparin drip for 1 hour and resume at  - heparin drip at 1250 units/hr (no bolus) - check 6 hr aPTT - monitor for s/s bleeding  Brice Kossman P 08/01/2018,9:32 AM

## 2018-08-01 NOTE — Progress Notes (Signed)
Pt returned to bed after sitting up for about 4hrs in chair. SRP, RN

## 2018-08-01 NOTE — Progress Notes (Addendum)
Pt taking sips of clear liquids. Intially pt became nauseated and vomited approx. 50 cc early am, later midday at lunch time, begin to tolerate clear liquids and ice chips  decreased nausea and no vomiting. NGT remains clamped as ordered. Bowels sounds hypoactive. SRP, RN

## 2018-08-01 NOTE — Progress Notes (Signed)
Pt had large soft brown liquid stool with food particles noted. Pt states some improvement after BM, no nausea or vomiting, abd tenderness. No blood visible in stool.

## 2018-08-01 NOTE — Progress Notes (Signed)
Heparin restarted as ordered per Pharmacy protocol. SRP, RN

## 2018-08-01 NOTE — Progress Notes (Signed)
Lab called to report aPTT 192....[facility] pharmacy updated. Will review pending labs scheduled this am. No sign of bleeding noted. SRP, RN

## 2018-08-02 ENCOUNTER — Inpatient Hospital Stay (HOSPITAL_COMMUNITY): Payer: Medicare Other

## 2018-08-02 LAB — BASIC METABOLIC PANEL
Anion gap: 10 (ref 5–15)
BUN: 15 mg/dL (ref 8–23)
CO2: 25 mmol/L (ref 22–32)
Calcium: 6.7 mg/dL — ABNORMAL LOW (ref 8.9–10.3)
Chloride: 107 mmol/L (ref 98–111)
Creatinine, Ser: 0.84 mg/dL (ref 0.44–1.00)
GFR calc Af Amer: >60 mL/min (ref >60)
GFR calc non Af Amer: >60 mL/min (ref >60)
Glucose, Bld: 92 mg/dL (ref 70–99)
Potassium: 4.1 mmol/L (ref 3.5–5.1)
Sodium: 142 mmol/L (ref 135–145)

## 2018-08-02 LAB — CBC WITH DIFFERENTIAL/PLATELET
Abs Immature Granulocytes: 0.05 10*3/uL (ref 0.00–0.07)
Basophils Absolute: 0 10*3/uL (ref 0.0–0.1)
Basophils Relative: 0 %
Eosinophils Absolute: 0.1 10*3/uL (ref 0.0–0.5)
Eosinophils Relative: 1 %
HCT: 37.7 % (ref 36.0–46.0)
Hemoglobin: 11.2 g/dL — ABNORMAL LOW (ref 12.0–15.0)
Immature Granulocytes: 1 %
Lymphocytes Relative: 12 %
Lymphs Abs: 1 10*3/uL (ref 0.7–4.0)
MCH: 29.2 pg (ref 26.0–34.0)
MCHC: 29.7 g/dL — ABNORMAL LOW (ref 30.0–36.0)
MCV: 98.4 fL (ref 80.0–100.0)
Monocytes Absolute: 0.7 10*3/uL (ref 0.1–1.0)
Monocytes Relative: 9 %
Neutro Abs: 6.3 10*3/uL (ref 1.7–7.7)
Neutrophils Relative %: 77 %
Platelets: 172 10*3/uL (ref 150–400)
RBC: 3.83 MIL/uL — ABNORMAL LOW (ref 3.87–5.11)
RDW: 16.4 % — ABNORMAL HIGH (ref 11.5–15.5)
WBC: 8.1 10*3/uL (ref 4.0–10.5)
nRBC: 0 % (ref 0.0–0.2)

## 2018-08-02 LAB — APTT: aPTT: 71 seconds — ABNORMAL HIGH (ref 24–36)

## 2018-08-02 LAB — HEPARIN LEVEL (UNFRACTIONATED): Heparin Unfractionated: 0.51 IU/mL (ref 0.30–0.70)

## 2018-08-02 MED ORDER — HYDROMORPHONE HCL 1 MG/ML IJ SOLN
0.5000 mg | Freq: Three times a day (TID) | INTRAMUSCULAR | Status: DC | PRN
  Administered 2018-08-02 – 2018-08-03 (×2): 0.5 mg via INTRAVENOUS
  Filled 2018-08-02 (×2): qty 0.5

## 2018-08-02 MED ORDER — ENOXAPARIN SODIUM 120 MG/0.8ML ~~LOC~~ SOLN
120.0000 mg | Freq: Two times a day (BID) | SUBCUTANEOUS | Status: DC
  Administered 2018-08-02 – 2018-08-04 (×5): 120 mg via SUBCUTANEOUS
  Filled 2018-08-02 (×5): qty 0.8

## 2018-08-02 MED ORDER — DIPHENHYDRAMINE HCL 50 MG/ML IJ SOLN
12.5000 mg | Freq: Three times a day (TID) | INTRAMUSCULAR | Status: DC | PRN
  Administered 2018-08-02 – 2018-08-05 (×5): 12.5 mg via INTRAVENOUS
  Filled 2018-08-02 (×5): qty 1

## 2018-08-02 MED ORDER — SODIUM CHLORIDE 0.9 % IV SOLN
INTRAVENOUS | Status: DC | PRN
  Administered 2018-08-02: 250 mL via INTRAVENOUS

## 2018-08-02 MED ORDER — CALCIUM GLUCONATE-NACL 1-0.675 GM/50ML-% IV SOLN
1.0000 g | Freq: Once | INTRAVENOUS | Status: AC
  Administered 2018-08-02: 18:00:00 1000 mg via INTRAVENOUS
  Filled 2018-08-02: qty 50

## 2018-08-02 NOTE — Progress Notes (Signed)
Patient ambulated again to the bathroom and had a large, soft, brown bowel movement and then proceeded to ambulate in the hallway.  Pt HR elevated to 170s during ambulation.  Once patient was resting, HR was in the 120s-130s for brief time but came down to 97 with scheduled IV metoprolol.  Dr. Horris Latino informed.

## 2018-08-02 NOTE — Progress Notes (Addendum)
Patient up to chair with physical therapy.  Patient ambulated to bathroom and back with walker, had a small BM.  Attempted to walk in hallway but HR spiked.  Patient back to chair, resting in chair now, HR back to baseline atrial fibrillation in the 100s.  Patient oxygen saturation dropped to approx 87% on room air with ambulation, but patient recovered with replacement of 02.  This is all per verbal report from PT. Patient also disoriented to sequence of events; I.e., which healthcare providers have been in the room, what time of day it is, which medications she has received, when did she have a BM, when did she have episode of emesis, etc.  Reoriented patient. Patient has not had emesis since beginning of this RN's shift at 7 am.  Patient has very slow, minute amount of bleeding from nose since yesterday, per night RN.  Will continue to monitor.

## 2018-08-02 NOTE — Progress Notes (Signed)
Nurse tech and nurse have asked and encouraged patient to ambulate this morning several times.  Patient refuses, without giving explanation.  Attempted to educate patient regarding importance of ambulation and bowel obstruction, but patient still refusing.  Will continue to monitor and encourage mobility.

## 2018-08-02 NOTE — Care Management Important Message (Signed)
Important Message  Patient Details IM Letter given to Sharren Bridge SW to present to the Patient Name: Jillian Eaton [facility] MRN: [id number] Date of Birth: 08-May-1950   Medicare Important Message Given:  Yes    Kerin Salen 08/02/2018, 10:43 AM

## 2018-08-02 NOTE — Progress Notes (Signed)
[place] for heparin--> lovenox  Indication: hx atrial fibrillation (home Eliquis on hold)  Allergies  Allergen Reactions  . Hydrocodone Itching    Patient Measurements: Height: 5\' 5"  (165.1 cm) Weight: 260 lb 9.3 oz (118.2 kg) IBW/kg (Calculated) : 57 Heparin Dosing Weight: 85 kg  Vital Signs: Temp: 98.8 F (37.1 C) (06/12 0500) Temp Source: Oral (06/12 0500) BP: 113/82 (06/12 0625) Pulse Rate: 86 (06/12 0625)  Labs: Recent Labs    07/31/18 0334  07/31/18 1706 08/01/18 0558 08/01/18 1047 08/01/18 1939 08/02/18 0640  HGB 14.2  --   --  12.3  --   --  11.2*  HCT 44.8  --   --  42.3  --   --  37.7  PLT 203  --   --  166  --   --  172  APTT  --    < > 79* 192* >200* 112* 71*  HEPARINUNFRC  --    < > 1.18* 1.38*  --   --  0.51  CREATININE 0.87  --   --  0.99  --   --  0.84   < > = values in this interval not displayed.    Estimated Creatinine Clearance: 84.8 mL/min (by C-G formula based on SCr of 0.84 mg/dL).   Medications:  - on Eliquis 5 mg bid PTA (Last dose taken on 07/30/18 at noon)  Assessment: Patient's a 68 y.o F with hx SBO and afib on Eliquis PTA, presented to the ED on 6/10 with c/o abdominal pain, n/v.  Abdominal CT on 6/10 showed SBO.  Anticoagulation changed to heparin drip on 6/10 in case surgical intervention is needed.  Patient is a hard stick, ok with Dr. Horris Latino and Margie Billet to change anticoag. to lovenox on 6/12.  Today, 08/02/2018: - no bleeding documented - cbc ok  Goal of Therapy:  Heparin level 0.3-0.7 units/ml aPTT 66-102 seconds Anti-Xa level 0.6-1 units/ml 4hrs after LMWH dose given Monitor platelets by anticoagulation protocol: Yes   Plan:  - d/c heparin drip heparin drip - start lovenox 120 mg SQ q12h - Pharmacy will sign off for lovenox but will follow patient peripherally along with you - monitor for s/s bleeding  Haydn Cush P 08/02/2018,9:29 AM

## 2018-08-02 NOTE — Evaluation (Signed)
Physical Therapy Evaluation Patient Details Name: Jillian Eaton MRN: [id number] DOB: 1950-10-15 Today's Date: 08/02/2018   History of Present Illness  68 yo female admitted to ED with LLQ pain, N/V. Pt with SBO, being followed conservatively, NGT placed and clamped presently. Pt with recent hospitalization 2 months ago for seizure-like activity, L cerebellar infarct. PMH includes fibromyalgia, CHF, pulmonary HTN, morbid obesity, seizures, A-fib, chronic pain syndrome, acute diastolic HF.  Clinical Impression   Pt presents with abdominal pain especially with bed mobility, LE weakness, difficulty performing mobility tasks, increased time and effort to perform mobility tasks, extreme tachycardic up to 171 bpm with afib with ambulation, and decreased activity tolerance. Pt to benefit from acute PT to address deficits. Pt ambulated 45 ft total today, limited by tachycardia, DOE with Sats 87% on RA, O2 replaced and both SpO2 and HR recovered with rest. PT recommending HHPT to address mobility deficits upon d/c home, pt states her husband can assist her lightly upon return home. PT to progress mobility as tolerated, and will continue to follow acutely.      Follow Up Recommendations Home health PT;Supervision/Assistance - 24 hour    Equipment Recommendations  None recommended by PT    Recommendations for Other Services       Precautions / Restrictions Precautions Precautions: Fall Restrictions Weight Bearing Restrictions: No      Mobility  Bed Mobility Overal bed mobility: Needs Assistance Bed Mobility: Supine to Sit     Supine to sit: Min assist;HOB elevated     General bed mobility comments: Min assist for trunk elevation, LE management, scooting to EOB. Increased time and effort, use of bedrails.  Transfers Overall transfer level: Needs assistance Equipment used: Rolling walker (2 wheeled) Transfers: Sit to/from Stand Sit to Stand: Min assist         General  transfer comment: Min assist x2, once from bed and once from toilet. Min assist for power up, steadying. Increased time to come to upright with tendency for assume forward flexed trunk posture.  Ambulation/Gait Ambulation/Gait assistance: Min assist;Min guard Gait Distance (Feet): 45 Feet(10 + 10 + 25) Assistive device: Rolling walker (2 wheeled) Gait Pattern/deviations: Step-through pattern;Decreased stride length;Trunk flexed Gait velocity: decr   General Gait Details: Min guard to min assist for steadying, verbal cuing for upright posture, placement in RW. Pt with heavy forward flexed posture with DOE 2/4. Pt required one standing rest break in hallway, HR 171 bpm at its highest. Further ambulation down hall terminated given pt's HR and response to activity.  Stairs            Wheelchair Mobility    Modified Rankin (Stroke Patients Only)       Balance Overall balance assessment: Needs assistance Sitting-balance support: No upper extremity supported;Feet supported Sitting balance-Leahy Scale: Fair     Standing balance support: Bilateral upper extremity supported Standing balance-Leahy Scale: Poor Standing balance comment: reliant on external support in standing                             Pertinent Vitals/Pain Pain Assessment: Faces Faces Pain Scale: Hurts little more Pain Location: head Pain Descriptors / Indicators: Headache Pain Intervention(s): Limited activity within patient's tolerance;Monitored during session;Repositioned;Patient requesting pain meds-RN notified    Home Living Family/patient expects to be discharged to:: Private residence Living Arrangements: Spouse/significant other(pt states she lives with her husband, who "has his own problems" but can help her as needed) Available  Help at Discharge: Family;Available 24 hours/day Type of Home: Apartment Home Access: Level entry;Elevator     Home Layout: One level Home Equipment: Walker - 2  wheels Additional Comments: spouse has some health issues and is unable to do heavy lifting     Prior Function Level of Independence: Independent with assistive device(s)         Comments: Pt reports she was using RW for ambulation PTA, did not require assist for ADLs.     Hand Dominance   Dominant Hand: Right    Extremity/Trunk Assessment   Upper Extremity Assessment Upper Extremity Assessment: Generalized weakness    Lower Extremity Assessment Lower Extremity Assessment: Generalized weakness    Cervical / Trunk Assessment Cervical / Trunk Assessment: Normal  Communication   Communication: No difficulties  Cognition Arousal/Alertness: Awake/alert Behavior During Therapy: WFL for tasks assessed/performed;Flat affect Overall Cognitive Status: Within Functional Limits for tasks assessed                                        General Comments General comments (skin integrity, edema, etc.): Pt in afib, with HR 110-171 bpm with mobility. Pt with 87% SpO2 on RA post-ambulation, pt placed back on 2LO2.    Exercises     Assessment/Plan    PT Assessment Patient needs continued PT services  PT Problem List Decreased strength;Decreased mobility;Decreased safety awareness;Decreased activity tolerance;Decreased balance;Pain;Decreased knowledge of use of DME;Cardiopulmonary status limiting activity       PT Treatment Interventions DME instruction;Therapeutic activities;Therapeutic exercise;Gait training;Patient/family education;Balance training;Functional mobility training    PT Goals (Current goals can be found in the Care Plan section)  Acute Rehab PT Goals Patient Stated Goal: go home to husband, feel better PT Goal Formulation: With patient Time For Goal Achievement: 08/16/18 Potential to Achieve Goals: Good    Frequency Min 3X/week   Barriers to discharge        Co-evaluation               AM-PAC PT "6 Clicks" Mobility  Outcome Measure  Help needed turning from your back to your side while in a flat bed without using bedrails?: A Lot Help needed moving from lying on your back to sitting on the side of a flat bed without using bedrails?: A Lot Help needed moving to and from a bed to a chair (including a wheelchair)?: A Little Help needed standing up from a chair using your arms (e.g., wheelchair or bedside chair)?: A Little Help needed to walk in hospital room?: A Little Help needed climbing 3-5 steps with a railing? : A Lot 6 Click Score: 15    End of Session Equipment Utilized During Treatment: Gait belt;Oxygen Activity Tolerance: Patient limited by fatigue;Treatment limited secondary to medical complications (Comment) Patient left: in chair;with call bell/phone within reach;with chair alarm set Nurse Communication: Mobility status PT Visit Diagnosis: Other abnormalities of gait and mobility (R26.89);Muscle weakness (generalized) (M62.81)    Time: 5784-6962 PT Time Calculation (min) (ACUTE ONLY): 26 min   Charges:   PT Evaluation $PT Eval Low Complexity: 1 Low PT Treatments $Gait Training: 8-22 mins        Nicola Police, PT Acute Rehabilitation Services Pager [phone]  Office [phone]   Serjio Deupree D Despina Hidden 08/02/2018, 2:51 PM

## 2018-08-02 NOTE — Progress Notes (Signed)
PROGRESS NOTE  Jillian Eaton JXB:[id number] DOB: 1950/06/20 DOA: 07/31/2018 PCP: [name], MD  HPI/Recap of past 24 hours: HPI from Dr Eliott Nine Wanke is a 68 y.o. 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 Pt denies any diarrhea, no fevers or chills. In the ED, CT abdomen pelvis was notable for small bowel obstruction with transition point in the right upper quadrant. Pt admitted for SBO and general surgery was consulted.   Today, patient still reporting some mild abdominal cramping, still unable to tolerate orally as she still feels nauseated.  New complaints  Assessment/Plan: Principal Problem:   SBO (small bowel obstruction) (HCC) Active Problems:   Morbid obesity with body mass index of 40.0-49.9 (HCC)   Pulmonary hypertension (HCC)   Seizures (HCC)   Chronic atrial fibrillation   Chronic pain syndrome   Fibromyalgia   GERD (gastroesophageal reflux disease)   Postoperative hypothyroidism   On anticoagulant therapy   Chronic diastolic CHF (congestive heart failure) (HCC)   CKD (chronic kidney disease) stage 3, GFR 30-59 ml/min (HCC)   SBO (small bowel obstruction) Likely secondary to multiple prior surgeries, adhesions General surgery on board: NG tube, small bowel protocol, gentle IV fluids,  antiemetics, supportive care Monitor closely  ??UTI UA positive for nitrites, leukocytes, WBC No known complaints UC pending No antibiotics for now  Hypocalcemia Noted, will work up patient Give 1 dose of ca. gluconate  Paroxysmal atrial fibrillation PO metoprolol changed to IV Hold Eliquis, use lovenox in the interim  Chronic diastolic CHF  Appears clinically euvolemic Held Lasix, gentle IV fluids, once tolerating orally Monitor closely  Seizures Continue Keppra changed to IV  Chronic pain syndrome/fibro-myalgia Holding tramadol, Flexeril for now  Postoperative hypothyroidism Change Synthroid to IV  Morbid obesity Advised lifestyle modification         Malnutrition Type:      Malnutrition Characteristics:      Nutrition Interventions:       Estimated body mass index is 43.36 kg/m as calculated from the following:   Height as of this encounter: 5\' 5"  (1.651 m).   Weight as of this encounter: 118.2 kg.     Code Status: Full  Family Communication: None at bedside  Disposition Plan: Once significant clinical improvement   Consultants:  General surgery  Procedures:  None  Antimicrobials:  None  DVT prophylaxis: Lovenox   Objective: Vitals:   08/02/18 0040 08/02/18 0500 08/02/18 0625 08/02/18 1304  BP:  92/67 113/82 112/84  Pulse: 95 (!) 102 86 (!) 105  Resp: 12 14 14 16   Temp:  98.8 F (37.1 C)  (!) 97.5 F (36.4 C)  TempSrc:  Oral    SpO2: 100% 100%  100%  Weight:      Height:        Intake/Output Summary (Last 24 hours) at 08/02/2018 1539 Last data filed at 08/02/2018 0650 Gross per 24 hour  Intake 1589.69 ml  Output 600  ml  Net 989.69 ml   Filed Weights   07/31/18 0137 07/31/18 1739  Weight: 117.9 kg 118.2 kg    Exam:  General: NAD   Cardiovascular: S1, S2 present  Respiratory: CTAB  Abdomen: Soft, nontender, obese, hypoactive bowel sounds present  Musculoskeletal: No bilateral pedal edema  noted  Skin: Normal  Psychiatry: Normal mood   Data Reviewed: CBC: Recent Labs  Lab 07/31/18 0334 08/01/18 0558 08/02/18 0640  WBC 7.5 7.6 8.1  NEUTROABS 5.3  --  6.3  HGB 14.2 12.3 11.2*  HCT 44.8 42.3 37.7  MCV 92.9 98.4 98.4  PLT 203 166 076   Basic Metabolic Panel: Recent Labs  Lab 07/31/18 0334 07/31/18 0521 08/01/18 0558 08/02/18 0640  NA 139  --  141 142  K 6.8* 4.4 4.5 4.1  CL 106  --  109 107  CO2 25  --  20* 25  GLUCOSE 101*  --  83 92  BUN 22  --  17 15  CREATININE 0.87  --  0.99 0.84  CALCIUM 8.2*  --  7.5* 6.7*   GFR: Estimated Creatinine Clearance: 84.8 mL/min (by C-G formula based on SCr of 0.84 mg/dL). Liver Function Tests: Recent Labs  Lab 07/31/18 0334 08/01/18 0558  AST 25 24  ALT 12 16  ALKPHOS 83 84  BILITOT 0.7 0.5  PROT 8.0 7.4  ALBUMIN 4.0 3.7   No results for input(s): LIPASE, AMYLASE in the last 168 hours. No results for input(s): AMMONIA in the last 168 hours. Coagulation Profile: No results for input(s): INR, PROTIME in the last 168 hours. Cardiac Enzymes: No results for input(s): CKTOTAL, CKMB, CKMBINDEX, TROPONINI in the last 168 hours. BNP (last 3 results) No results for input(s): PROBNP in the last 8760 hours. HbA1C: No results for input(s): HGBA1C in the last 72 hours. CBG: No results for input(s): GLUCAP in the last 168 hours. Lipid Profile: No results for input(s): CHOL, HDL, LDLCALC, TRIG, CHOLHDL, LDLDIRECT in the last 72 hours. Thyroid Function Tests: No results for input(s): TSH, T4TOTAL, FREET4, T3FREE, THYROIDAB in the last 72 hours. Anemia Panel: No results for input(s): VITAMINB12, FOLATE, FERRITIN, TIBC, IRON, RETICCTPCT in the last 72 hours. Urine analysis:    Component Value Date/Time   COLORURINE YELLOW 07/31/2018 0657   APPEARANCEUR HAZY (A) 07/31/2018 0657   LABSPEC 1.015 07/31/2018 0657   PHURINE 5.0 07/31/2018 0657   GLUCOSEU NEGATIVE 07/31/2018 0657   HGBUR MODERATE (A) 07/31/2018 0657    BILIRUBINUR NEGATIVE 07/31/2018 0657   KETONESUR NEGATIVE 07/31/2018 0657   PROTEINUR NEGATIVE 07/31/2018 0657   UROBILINOGEN 1.0 06/06/2013 2154   NITRITE POSITIVE (A) 07/31/2018 0657   LEUKOCYTESUR SMALL (A) 07/31/2018 0657   Sepsis Labs: @LABRCNTIP (procalcitonin:4,lacticidven:4)  ) Recent Results (from the past 240 hour(s))  SARS Coronavirus 2 (CEPHEID - Performed in [facility] hospital lab), Hosp Order     Status: None   Collection Time: 07/31/18  6:34 AM   Specimen: Nasopharyngeal Swab  Result Value Ref Range Status   SARS Coronavirus 2 NEGATIVE NEGATIVE Final    Comment: (NOTE) If result is NEGATIVE SARS-CoV-2 target nucleic acids are NOT DETECTED. The SARS-CoV-2 RNA is generally detectable in upper and lower  respiratory specimens during the acute phase of infection. The lowest  concentration of SARS-CoV-2 viral copies this assay can detect is 250  copies / mL. A negative result does not preclude SARS-CoV-2 infection  and should not be used as the sole basis for treatment or other  patient  management decisions.  A negative result may occur with  improper specimen collection / handling, submission of specimen other  than nasopharyngeal swab, presence of viral mutation(s) within the  areas targeted by this assay, and inadequate number of viral copies  (<250 copies / mL). A negative result must be combined with clinical  observations, patient history, and epidemiological information. If result is POSITIVE SARS-CoV-2 target nucleic acids are DETECTED. The SARS-CoV-2 RNA is generally detectable in upper and lower  respiratory specimens dur ing the acute phase of infection.  Positive  results are indicative of active infection with SARS-CoV-2.  Clinical  correlation with patient history and other diagnostic information is  necessary to determine patient infection status.  Positive results do  not rule out bacterial infection or co-infection with other viruses. If result is  PRESUMPTIVE POSTIVE SARS-CoV-2 nucleic acids MAY BE PRESENT.   A presumptive positive result was obtained on the submitted specimen  and confirmed on repeat testing.  While 2019 novel coronavirus  (SARS-CoV-2) nucleic acids may be present in the submitted sample  additional confirmatory testing may be necessary for epidemiological  and / or clinical management purposes  to differentiate between  SARS-CoV-2 and other Sarbecovirus currently known to infect humans.  If clinically indicated additional testing with an alternate test  methodology [phone]) is advised. The SARS-CoV-2 RNA is generally  detectable in upper and lower respiratory sp ecimens during the acute  phase of infection. The expected result is Negative. Fact Sheet for Patients:  StrictlyIdeas.no Fact Sheet for Healthcare Providers: BankingDealers.co.za This test is not yet approved or cleared by the [place] FDA and has been authorized for detection and/or diagnosis of SARS-CoV-2 by FDA under an Emergency Use Authorization (EUA).  This EUA will remain in effect (meaning this test can be used) for the duration of the COVID-19 declaration under Section 564(b)(1) of the Act, 21 U.S.C. section 360bbb-3(b)(1), unless the authorization is terminated or revoked sooner. Performed at [facility], [place] [address] [place], [place] [address]       Studies: Dg Abd Portable 1v  Result Date: 08/02/2018 CLINICAL DATA:  Bowel obstruction. EXAM: PORTABLE ABDOMEN - 1 VIEW COMPARISON:  08/01/2018.  07/31/2018 FINDINGS: Orogastric or nasogastric tube in the gastroesophageal junction/surgical region. Patient in use to have gas within small and large bowel and contrast within the colon. Contrast has progressed slightly. IMPRESSION: Orogastric or nasogastric tube in a similar position in the region of the upper surgical changes. Slight progression of contrast within the  colon. Electronically Signed   By: Nelson Chimes M.D.   On: 08/02/2018 11:19    Scheduled Meds: . bisacodyl  10 mg Rectal Daily  . enoxaparin (LOVENOX) injection  120 mg Subcutaneous Q12H  . levothyroxine  100 mcg Intravenous Daily  . lip balm  1 application Topical BID  . metoprolol tartrate  5 mg Intravenous Q6H  . ondansetron (ZOFRAN) IV  4 mg Intravenous Once    Continuous Infusions: . sodium chloride 50 mL/hr at 08/02/18 1213  . sodium chloride 250 mL (08/02/18 1216)  . calcium gluconate    . lactated ringers    . levETIRAcetam 500 mg (08/02/18 1239)  . ondansetron (ZOFRAN) IV       LOS: 2 days     Alma Friendly, MD [facility]  If 7PM-7AM, please contact night-coverage [email or web address] 08/02/2018, 3:39 PM

## 2018-08-02 NOTE — Progress Notes (Addendum)
[facility] Progress Note     Subjective: CC-  Patient states that overall her pain is somewhat better than when she came in, but she is still having abdominal pain that requires pain medication.   She had a large, formed BM last night. No flatus. She does have some belching. She vomited last night with PO intake. So far has tolerated clear liquids this morning with no n/v. Per RN she is very resistant to getting out of bed.  Objective: Vital signs in last 24 hours: Temp:  [98.4 F (36.9 C)-98.8 F (37.1 C)] 98.8 F (37.1 C) (06/12 0500) Pulse Rate:  [86-117] 86 (06/12 0625) Resp:  [12-14] 14 (06/12 0625) BP: (92-120)/(64-82) 113/82 (06/12 0625) SpO2:  [98 %-100 %] 100 % (06/12 0500) Last BM Date: 08/01/18  Intake/Output from previous day: 06/11 0701 - 06/12 0700 In: 2556.6 [P.O.:290; I.V.:2066.6; IV Piggyback:200] Out: 600 [Urine:600] Intake/Output this shift: No intake/output data recorded.  PE: Gen:  Alert, NAD HEENT: EOM's intact, pupils equal and round Card:  irregular Pulm:  CTAB, no W/R/R, effort normal Abd: obese, soft, +BS, no HSM, mild epigastric/central abdominal TTP without peritonitis Ext:  Calves soft and nontender Skin: no rashes noted, warm and dry   Lab Results:  Recent Labs    08/01/18 0558 08/02/18 0640  WBC 7.6 8.1  HGB 12.3 11.2*  HCT 42.3 37.7  PLT 166 172   BMET Recent Labs    08/01/18 0558 08/02/18 0640  NA 141 142  K 4.5 4.1  CL 109 107  CO2 20* 25  GLUCOSE 83 92  BUN 17 15  CREATININE 0.99 0.84  CALCIUM 7.5* 6.7*   PT/INR No results for input(s): LABPROT, INR in the last 72 hours. CMP     Component Value Date/Time   NA 142 08/02/2018 0640   NA 146 (H) 06/19/2017 1222   K 4.1 08/02/2018 0640   CL 107 08/02/2018 0640   CO2 25 08/02/2018 0640   GLUCOSE 92 08/02/2018 0640   BUN 15 08/02/2018 0640   BUN 21 06/19/2017 1222   CREATININE 0.84 08/02/2018 0640   CREATININE 1.01 (H) 11/27/2017 1029   CALCIUM  6.7 (L) 08/02/2018 0640   CALCIUM 5.5 (LL) 07/10/2017 2104   PROT 7.4 08/01/2018 0558   PROT 6.8 06/19/2017 1222   ALBUMIN 3.7 08/01/2018 0558   ALBUMIN 3.8 06/19/2017 1222   AST 24 08/01/2018 0558   ALT 16 08/01/2018 0558   ALKPHOS 84 08/01/2018 0558   BILITOT 0.5 08/01/2018 0558   BILITOT 0.4 06/19/2017 1222   GFRNONAA >60 08/02/2018 0640   GFRNONAA 58 (L) 11/27/2017 1029   GFRAA >60 08/02/2018 0640   GFRAA 68 11/27/2017 1029   Lipase  No results found for: LIPASE     Studies/Results: Dg Abd 1 View  Result Date: 08/01/2018 CLINICAL DATA:  NG tube placement. EXAM: ABDOMEN - 1 VIEW COMPARISON:  One-view abdomen 07/31/2018 FINDINGS: Tip the NG tube is at the suture line of the gastrectomy. Side port is just above this level. Dilated loops of bowel are again noted. IMPRESSION: 1. Side port of the NG tube is just above the GE junction. Electronically Signed   By: San Morelle M.D.   On: 08/01/2018 08:38   Dg Abd Portable 1v-small Bowel Obstruction Protocol-initial, 8 Hr Delay  Result Date: 07/31/2018 CLINICAL DATA:  Small-bowel obstruction. EXAM: PORTABLE ABDOMEN - 1 VIEW COMPARISON:  07/31/2018 FINDINGS: Oral contrast is noted throughout the colon, to the level of the sigmoid  colon and likely the rectum. IV contrast is noted in the patient's urinary bladder from prior contrast enhanced study. There is some gaseous distention of loops of colon and small bowel scattered throughout the abdomen. IMPRESSION: Oral contrast seen to the level of the sigmoid colon/rectum. Electronically Signed   By: Constance Holster M.D.   On: 07/31/2018 19:18    Anti-infectives: Anti-infectives (From admission, onward)   None       Assessment/Plan Morbid obesity BMI 43.2 Recent seizures 03/9474 Chronic diastolic heart failure/cardiomyopathy Chronic atrial fibrillation-on Eliquis (last dose 6/9) Hypothyroid-prior thyroidectomy Severe osteoarthritis with limited mobility-walker  dependent Fibromyalgia  Remote Hx asthma Right nephrolithiasis/nonobstructing Hx of recurrent nephrolithiasis  Recurrent small bowel obstruction  -Hx of gastroplasty/gastroplasty revision/laparotomy/laparoscopy x3 for adhesions. Hx cholecystectomy/tubal ligation/renal calculus extraction x2  -CT 6/10 showed SBO with transition point in the RUQ likely due to adhesions or related to previous anastomosis  -small bowel protoco l>> contrast in colon  FEN: CLD/NG tube clamped/IV fluids  ID: None DVT: IV heparin Follow-up: To be to determined  POC: Calarco,Theodore Spouse   [phone]  [place] Niece [phone]    hough,cyara Denman George   [phone]   Plan: Having bowel function but she vomited again last night. Keep NG tube clamped and check abdominal film.   Discussed importance of mobilizing/getting OOB.    LOS: 2 days    Wellington Hampshire , [facility] Surgery 08/02/2018, 9:25 AM Pager: [phone]  Agree with above. She has walked once today.  I have encouraged her to walk more.   She is difficult to exam because of her obesity. She got sick after clear liquids this AM, so she still has her NGT. Her KUB shows contrast in the colon, but some remaining dilated SB.  Alphonsa Overall, MD, [facility] Pager: [phone] Office phone:  [phone]

## 2018-08-02 NOTE — Telephone Encounter (Signed)
Spoke with pt regarding appt on 08/05/18. Pt stated she is currently in the hospital and would like to reschedule appt.

## 2018-08-03 ENCOUNTER — Inpatient Hospital Stay (HOSPITAL_COMMUNITY): Payer: Medicare Other

## 2018-08-03 LAB — CBC WITH DIFFERENTIAL/PLATELET
Abs Immature Granulocytes: 0.02 10*3/uL (ref 0.00–0.07)
Basophils Absolute: 0 10*3/uL (ref 0.0–0.1)
Basophils Relative: 0 %
Eosinophils Absolute: 0.2 10*3/uL (ref 0.0–0.5)
Eosinophils Relative: 2 %
HCT: 34.5 % — ABNORMAL LOW (ref 36.0–46.0)
Hemoglobin: 10.4 g/dL — ABNORMAL LOW (ref 12.0–15.0)
Immature Granulocytes: 0 %
Lymphocytes Relative: 16 %
Lymphs Abs: 1.1 10*3/uL (ref 0.7–4.0)
MCH: 29.4 pg (ref 26.0–34.0)
MCHC: 30.1 g/dL (ref 30.0–36.0)
MCV: 97.5 fL (ref 80.0–100.0)
Monocytes Absolute: 0.8 10*3/uL (ref 0.1–1.0)
Monocytes Relative: 12 %
Neutro Abs: 4.9 10*3/uL (ref 1.7–7.7)
Neutrophils Relative %: 70 %
Platelets: 172 10*3/uL (ref 150–400)
RBC: 3.54 MIL/uL — ABNORMAL LOW (ref 3.87–5.11)
RDW: 16.4 % — ABNORMAL HIGH (ref 11.5–15.5)
WBC: 7 10*3/uL (ref 4.0–10.5)
nRBC: 0 % (ref 0.0–0.2)

## 2018-08-03 LAB — BRAIN NATRIURETIC PEPTIDE: B Natriuretic Peptide: 321.9 pg/mL — ABNORMAL HIGH (ref 0.0–100.0)

## 2018-08-03 LAB — D-DIMER, QUANTITATIVE: D-Dimer, Quant: 0.31 ug/mL-FEU (ref 0.00–0.50)

## 2018-08-03 LAB — BASIC METABOLIC PANEL
Anion gap: 8 (ref 5–15)
BUN: 14 mg/dL (ref 8–23)
CO2: 23 mmol/L (ref 22–32)
Calcium: 6.8 mg/dL — ABNORMAL LOW (ref 8.9–10.3)
Chloride: 110 mmol/L (ref 98–111)
Creatinine, Ser: 0.79 mg/dL (ref 0.44–1.00)
GFR calc Af Amer: >60 mL/min (ref >60)
GFR calc non Af Amer: >60 mL/min (ref >60)
Glucose, Bld: 94 mg/dL (ref 70–99)
Potassium: 4.2 mmol/L (ref 3.5–5.1)
Sodium: 141 mmol/L (ref 135–145)

## 2018-08-03 MED ORDER — DULOXETINE HCL 60 MG PO CPEP
60.0000 mg | ORAL_CAPSULE | Freq: Every day | ORAL | Status: DC
  Administered 2018-08-03 – 2018-08-05 (×3): 60 mg via ORAL
  Filled 2018-08-03 (×3): qty 1

## 2018-08-03 MED ORDER — CYCLOBENZAPRINE HCL 10 MG PO TABS
20.0000 mg | ORAL_TABLET | Freq: Two times a day (BID) | ORAL | Status: DC
  Administered 2018-08-03 – 2018-08-05 (×4): 20 mg via ORAL
  Filled 2018-08-03 (×5): qty 2

## 2018-08-03 MED ORDER — SODIUM CHLORIDE 0.9 % IV SOLN
1.0000 g | INTRAVENOUS | Status: AC
  Administered 2018-08-03 – 2018-08-05 (×3): 1 g via INTRAVENOUS
  Filled 2018-08-03 (×3): qty 1

## 2018-08-03 MED ORDER — METOPROLOL TARTRATE 50 MG PO TABS
50.0000 mg | ORAL_TABLET | Freq: Two times a day (BID) | ORAL | Status: DC
  Administered 2018-08-03 – 2018-08-05 (×4): 50 mg via ORAL
  Filled 2018-08-03 (×4): qty 1

## 2018-08-03 MED ORDER — ACETAMINOPHEN 325 MG PO TABS
650.0000 mg | ORAL_TABLET | Freq: Four times a day (QID) | ORAL | Status: DC | PRN
  Administered 2018-08-04: 650 mg via ORAL
  Filled 2018-08-03: qty 2

## 2018-08-03 MED ORDER — TRAZODONE HCL 50 MG PO TABS
50.0000 mg | ORAL_TABLET | Freq: Every day | ORAL | Status: DC
  Administered 2018-08-03 – 2018-08-04 (×2): 50 mg via ORAL
  Filled 2018-08-03 (×2): qty 1

## 2018-08-03 NOTE — Progress Notes (Signed)
PROGRESS NOTE  Jillian Eaton OQH:[id number] DOB: 1950/12/12 DOA: 07/31/2018 PCP: [name], MD  HPI/Recap of past 24 hours: HPI from Dr Eliott Nine Spreen is a 68 y.o. 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 Pt denies any diarrhea, no fevers or chills. In the ED, CT abdomen pelvis was notable for small bowel obstruction with transition point in the right upper quadrant. Pt admitted for SBO and general surgery was consulted.   Today, patient reported feeling better, able to tolerate p.o. clear liquid.  Denies worsening abdominal pain, shortness of breath, chest pain, nausea/vomiting.  Having bowel movements.  Assessment/Plan: Principal Problem:   SBO (small bowel obstruction) (HCC) Active Problems:   Morbid obesity with body mass index of 40.0-49.9 (HCC)   Pulmonary hypertension (HCC)   Seizures (HCC)   Chronic atrial fibrillation   Chronic pain syndrome   Fibromyalgia   GERD (gastroesophageal reflux disease)   Postoperative hypothyroidism   On anticoagulant therapy   Chronic diastolic CHF (congestive heart failure) (HCC)   CKD (chronic kidney disease) stage 3, GFR 30-59 ml/min (HCC)   SBO (small bowel obstruction) Improving Likely secondary to multiple prior surgeries, adhesions General surgery on board: NG  tube, small bowel protocol, gentle IV fluids, antiemetics, supportive care Monitor closely  UTI UA positive for nitrites, leukocytes, WBC UC grew >100,000 negative rods Start Rocephin  Hypocalcemia Noted, will order ionized calcium pending Gave 1 dose of ca. gluconate  Paroxysmal atrial fibrillation Rate uncontrolled D-dimer negative Start p.o. Lopressor, IV Lopressor as needed If not controlled, may need Cardizem drip Hold Eliquis for now, continue Lovenox in the interim  Chronic diastolic CHF  Appears clinically euvolemic BNP around baseline Chest x-ray unremarkable Held Lasix, gentle IV fluids, once tolerating orally Monitor closely  Seizures Continue Keppra changed to IV  Chronic pain syndrome/fibro-myalgia Holding tramadol, start Flexeril for now  Postoperative hypothyroidism Change Synthroid to IV  Morbid obesity Advised lifestyle modification         Malnutrition Type:      Malnutrition Characteristics:      Nutrition Interventions:       Estimated body mass index is 43.36 kg/m as calculated from the following:   Height as of this encounter: 5\' 5"  (1.651 m).   Weight as of this encounter: 118.2 kg.     Code Status: Full  Family Communication: None at bedside  Disposition Plan: Once significant clinical improvement   Consultants:  General surgery  Procedures:  None  Antimicrobials:  None  DVT prophylaxis: Lovenox   Objective: Vitals:   08/03/18 1126 08/03/18 1255 08/03/18 1335 08/03/18 1444  BP:  (!) 144/103 (!) 141/104 (!) 128/99  Pulse:  (!) 116 (!) 115 (!) 101  Resp:  18    Temp:      TempSrc:      SpO2: 96% 94%    Weight:      Height:        Intake/Output  Summary (Last 24 hours) at 08/03/2018 1552 Last data filed at 08/03/2018 1536 Gross per 24 hour  Intake 1632.27 ml  Output -  Net 1632.27 ml   Filed Weights   07/31/18 0137 07/31/18 1739  Weight: 117.9 kg 118.2 kg    Exam:  General: NAD    Cardiovascular: S1, S2 present  Respiratory: CTAB  Abdomen: Soft, nontender, obese, hypoactive bowel sounds present  Musculoskeletal: No bilateral pedal edema noted  Skin: Normal  Psychiatry: Normal mood   Data Reviewed: CBC: Recent Labs  Lab 07/31/18 0334 08/01/18 0558 08/02/18 0640 08/03/18 0409  WBC 7.5 7.6 8.1 7.0  NEUTROABS 5.3  --  6.3 4.9  HGB 14.2 12.3 11.2* 10.4*  HCT 44.8 42.3 37.7 34.5*  MCV 92.9 98.4 98.4 97.5  PLT 203 166 172 630   Basic Metabolic Panel: Recent Labs  Lab 07/31/18 0334 07/31/18 0521 08/01/18 0558 08/02/18 0640 08/03/18 0409  NA 139  --  141 142 141  K 6.8* 4.4 4.5 4.1 4.2  CL 106  --  109 107 110  CO2 25  --  20* 25 23  GLUCOSE 101*  --  83 92 94  BUN 22  --  17 15 14   CREATININE 0.87  --  0.99 0.84 0.79  CALCIUM 8.2*  --  7.5* 6.7* 6.8*   GFR: Estimated Creatinine Clearance: 89 mL/min (by C-G formula based on SCr of 0.79 mg/dL). Liver Function Tests: Recent Labs  Lab 07/31/18 0334 08/01/18 0558  AST 25 24  ALT 12 16  ALKPHOS 83 84  BILITOT 0.7 0.5  PROT 8.0 7.4  ALBUMIN 4.0 3.7   No results for input(s): LIPASE, AMYLASE in the last 168 hours. No results for input(s): AMMONIA in the last 168 hours. Coagulation Profile: No results for input(s): INR, PROTIME in the last 168 hours. Cardiac Enzymes: No results for input(s): CKTOTAL, CKMB, CKMBINDEX, TROPONINI in the last 168 hours. BNP (last 3 results) No results for input(s): PROBNP in the last 8760 hours. HbA1C: No results for input(s): HGBA1C in the last 72 hours. CBG: No results for input(s): GLUCAP in the last 168 hours. Lipid Profile: No results for input(s): CHOL, HDL, LDLCALC, TRIG, CHOLHDL, LDLDIRECT in the last 72 hours. Thyroid Function Tests: No results for input(s): TSH, T4TOTAL, FREET4, T3FREE, THYROIDAB in the last 72 hours. Anemia Panel: No results for input(s): VITAMINB12, FOLATE, FERRITIN, TIBC, IRON, RETICCTPCT in the last 72 hours. Urine  analysis:    Component Value Date/Time   COLORURINE YELLOW 07/31/2018 0657   APPEARANCEUR HAZY (A) 07/31/2018 0657   LABSPEC 1.015 07/31/2018 0657   PHURINE 5.0 07/31/2018 0657   GLUCOSEU NEGATIVE 07/31/2018 0657   HGBUR MODERATE (A) 07/31/2018 0657   BILIRUBINUR NEGATIVE 07/31/2018 0657   KETONESUR NEGATIVE 07/31/2018 0657   PROTEINUR NEGATIVE 07/31/2018 0657   UROBILINOGEN 1.0 06/06/2013 2154   NITRITE POSITIVE (A) 07/31/2018 0657   LEUKOCYTESUR SMALL (A) 07/31/2018 0657   Sepsis Labs: @LABRCNTIP (procalcitonin:4,lacticidven:4)  ) Recent Results (from the past 240 hour(s))  SARS Coronavirus 2 (CEPHEID - Performed in [facility] hospital lab), Hosp Order     Status: None   Collection Time: 07/31/18  6:34 AM   Specimen: Nasopharyngeal Swab  Result Value Ref Range Status   SARS Coronavirus 2 NEGATIVE NEGATIVE Final    Comment: (NOTE) If result is NEGATIVE SARS-CoV-2 target nucleic acids are NOT DETECTED. The SARS-CoV-2 RNA is generally detectable in upper and lower  respiratory specimens during the acute phase of infection. The  lowest  concentration of SARS-CoV-2 viral copies this assay can detect is 250  copies / mL. A negative result does not preclude SARS-CoV-2 infection  and should not be used as the sole basis for treatment or other  patient management decisions.  A negative result may occur with  improper specimen collection / handling, submission of specimen other  than nasopharyngeal swab, presence of viral mutation(s) within the  areas targeted by this assay, and inadequate number of viral copies  (<250 copies / mL). A negative result must be combined with clinical  observations, patient history, and epidemiological information. If result is POSITIVE SARS-CoV-2 target nucleic acids are DETECTED. The SARS-CoV-2 RNA is generally detectable in upper and lower  respiratory specimens dur ing the acute phase of infection.  Positive  results are indicative of active  infection with SARS-CoV-2.  Clinical  correlation with patient history and other diagnostic information is  necessary to determine patient infection status.  Positive results do  not rule out bacterial infection or co-infection with other viruses. If result is PRESUMPTIVE POSTIVE SARS-CoV-2 nucleic acids MAY BE PRESENT.   A presumptive positive result was obtained on the submitted specimen  and confirmed on repeat testing.  While 2019 novel coronavirus  (SARS-CoV-2) nucleic acids may be present in the submitted sample  additional confirmatory testing may be necessary for epidemiological  and / or clinical management purposes  to differentiate between  SARS-CoV-2 and other Sarbecovirus currently known to infect humans.  If clinically indicated additional testing with an alternate test  methodology [phone]) is advised. The SARS-CoV-2 RNA is generally  detectable in upper and lower respiratory sp ecimens during the acute  phase of infection. The expected result is Negative. Fact Sheet for Patients:  StrictlyIdeas.no Fact Sheet for Healthcare Providers: BankingDealers.co.za This test is not yet approved or cleared by the [place] FDA and has been authorized for detection and/or diagnosis of SARS-CoV-2 by FDA under an Emergency Use Authorization (EUA).  This EUA will remain in effect (meaning this test can be used) for the duration of the COVID-19 declaration under Section 564(b)(1) of the Act, 21 U.S.C. section 360bbb-3(b)(1), unless the authorization is terminated or revoked sooner. Performed at [facility], [place] [address] [place], [place] [address]   Urine Culture     Status: Abnormal (Preliminary result)   Collection Time: 08/01/18  3:14 PM   Specimen: Urine, Clean Catch  Result Value Ref Range Status   Specimen Description   Final    URINE, CLEAN CATCH Performed at [facility], [place]  [address], [place], [place] [address]    Special Requests   Final    NONE Performed at [facility], [place] [address] [place], [place] [address]    Culture (A)  Final    >=100,000 COLONIES/mL GRAM NEGATIVE RODS IDENTIFICATION AND SUSCEPTIBILITIES TO FOLLOW Performed at [facility], [place] [address], [place], [place] [address]    Report Status PENDING  Incomplete      Studies: Dg Chest Port 1 View  Result Date: 08/03/2018 CLINICAL DATA:  Shortness of breath. EXAM: PORTABLE CHEST 1 VIEW COMPARISON:  May 26, 2018 FINDINGS: The NG tube terminates below today's film. Stable cardiomegaly. The hila and mediastinum are unchanged. The lungs are clear. No other acute abnormalities. IMPRESSION: No acute interval change. An NG tube is been placed and terminates below today's film. Continued cardiomegaly. Electronically Signed   By: Dorise Bullion III M.D   On: 08/03/2018  09:31   Dg Abd 2 Views  Result Date: 08/03/2018 CLINICAL DATA:  Small-bowel obstruction EXAM: ABDOMEN - 2 VIEW COMPARISON:  08/02/2018 FINDINGS: Retained contrast in colon and appendix. Gas within colon as well. No definite small bowel dilatation. No gross free air. Bones demineralized. Numerous surgical clips in BILATERAL upper quadrants. Nasogastric tube extends into stomach. IMPRESSION: Contrast throughout colon to rectum. No small bowel dilatation. Electronically Signed   By: Lavonia Dana M.D.   On: 08/03/2018 13:58    Scheduled Meds: . bisacodyl  10 mg Rectal Daily  . cyclobenzaprine  20 mg Oral BID  . DULoxetine  60 mg Oral Daily  . enoxaparin (LOVENOX) injection  120 mg Subcutaneous Q12H  . levothyroxine  100 mcg Intravenous Daily  . lip balm  1 application Topical BID  . metoprolol tartrate  50 mg Oral BID  . ondansetron (ZOFRAN) IV  4 mg Intravenous Once  . traZODone  50 mg Oral QHS    Continuous Infusions: . sodium chloride 50 mL/hr at 08/03/18 1006  . sodium chloride 250 mL (08/02/18 1216)   . levETIRAcetam 500 mg (08/03/18 1337)  . ondansetron (ZOFRAN) IV       LOS: 3 days     Alma Friendly, MD [facility]  If 7PM-7AM, please contact night-coverage [email or web address] 08/03/2018, 3:52 PM

## 2018-08-03 NOTE — Progress Notes (Signed)
Subjective/Chief Complaint: Patient has had 2 bowel movements.  NG tube causing a lot of phlegm hurting throat.  Chest x-ray reveals this is partly coiled in her upper esophagus within continues down to her stomach.  No obvious distention on yesterday's films with contrast in the colon.  Denies abdominal pain and has done well with her tube clamped tolerating clears.   Objective: Vital signs in last 24 hours: Temp:  [97.5 F (36.4 C)-98.8 F (37.1 C)] 98.8 F (37.1 C) (06/13 0530) Pulse Rate:  [102-111] 111 (06/13 0530) Resp:  [16-20] 20 (06/13 0530) BP: (112-129)/(73-91) 117/73 (06/13 0530) SpO2:  [87 %-100 %] 87 % (06/13 0530) Last BM Date: 08/02/18  Intake/Output from previous day: 06/12 0701 - 06/13 0700 In: 1561.5 [I.V.:1411.5; IV Piggyback:150] Out: -  Intake/Output this shift: No intake/output data recorded.  General appearance: alert and cooperative GI: Soft nontender nondistended Neurologic: Grossly normal  Lab Results:  Recent Labs    08/02/18 0640 08/03/18 0409  WBC 8.1 7.0  HGB 11.2* 10.4*  HCT 37.7 34.5*  PLT 172 172   BMET Recent Labs    08/02/18 0640 08/03/18 0409  NA 142 141  K 4.1 4.2  CL 107 110  CO2 25 23  GLUCOSE 92 94  BUN 15 14  CREATININE 0.84 0.79  CALCIUM 6.7* 6.8*   PT/INR No results for input(s): LABPROT, INR in the last 72 hours. ABG No results for input(s): PHART, HCO3 in the last 72 hours.  Invalid input(s): PCO2, PO2  Studies/Results: Dg Chest Port 1 View  Result Date: 08/03/2018 CLINICAL DATA:  Shortness of breath. EXAM: PORTABLE CHEST 1 VIEW COMPARISON:  May 26, 2018 FINDINGS: The NG tube terminates below today's film. Stable cardiomegaly. The hila and mediastinum are unchanged. The lungs are clear. No other acute abnormalities. IMPRESSION: No acute interval change. An NG tube is been placed and terminates below today's film. Continued cardiomegaly. Electronically Signed   By: Dorise Bullion III M.D   On: 08/03/2018  09:31   Dg Abd Portable 1v  Result Date: 08/02/2018 CLINICAL DATA:  Bowel obstruction. EXAM: PORTABLE ABDOMEN - 1 VIEW COMPARISON:  08/01/2018.  07/31/2018 FINDINGS: Orogastric or nasogastric tube in the gastroesophageal junction/surgical region. Patient in use to have gas within small and large bowel and contrast within the colon. Contrast has progressed slightly. IMPRESSION: Orogastric or nasogastric tube in a similar position in the region of the upper surgical changes. Slight progression of contrast within the colon. Electronically Signed   By: Nelson Chimes M.D.   On: 08/02/2018 11:19    Anti-infectives: Anti-infectives (From admission, onward)   None      Assessment/Plan: Morbid obesity BMI 43.2 Recent seizures 0/0923 Chronic diastolic heart failure/cardiomyopathy Chronic atrial fibrillation-on Eliquis(last dose 6/9) Hypothyroid-prior thyroidectomy Severe osteoarthritis with limited mobility-walker dependent Fibromyalgia  Remote Hx asthma Right nephrolithiasis/nonobstructing Hx of recurrent nephrolithiasis  Recurrent small bowel obstruction            -Hx of gastroplasty/gastroplasty revision/laparotomy/laparoscopy x3 for adhesions. Hx cholecystectomy/tubal ligation/renal calculus extraction x2            -CT 6/10 showed SBO with transition point in the RUQ likely due to adhesions or related to previous anastomosis            -small bowel protoco l>> contrast in colon  FEN: CLD/NG tube clamped/IV fluids  ID: None DVT: IV heparin Follow-up: To be to determined  POC: Jillian Eaton,Jillian Eaton   [phone]  Jillian Eaton Niece [phone]  Jillian Eaton,Jillian Eaton   [phone]   Plan:Having bowel function  No nausea or vomiting yesterday on clears  DC NG tube since is partially coiled  She is going for films today for follow-up  Leave on clears for now to make sure she can tolerate this in the next 24 hours    LOS: 3 days    Marcello Moores A  Pessy Delamar 08/03/2018

## 2018-08-03 NOTE — Progress Notes (Signed)
Patient ambulated from her room to the nurses' station. Heart rate went up to the 150's during ambulation but dropped to 110-112 when resting.

## 2018-08-04 LAB — CBC WITH DIFFERENTIAL/PLATELET
Abs Immature Granulocytes: 0.02 10*3/uL (ref 0.00–0.07)
Basophils Absolute: 0 10*3/uL (ref 0.0–0.1)
Basophils Relative: 1 %
Eosinophils Absolute: 0.3 10*3/uL (ref 0.0–0.5)
Eosinophils Relative: 5 %
HCT: 30.3 % — ABNORMAL LOW (ref 36.0–46.0)
Hemoglobin: 9.2 g/dL — ABNORMAL LOW (ref 12.0–15.0)
Immature Granulocytes: 0 %
Lymphocytes Relative: 24 %
Lymphs Abs: 1.3 10*3/uL (ref 0.7–4.0)
MCH: 29.6 pg (ref 26.0–34.0)
MCHC: 30.4 g/dL (ref 30.0–36.0)
MCV: 97.4 fL (ref 80.0–100.0)
Monocytes Absolute: 0.7 10*3/uL (ref 0.1–1.0)
Monocytes Relative: 13 %
Neutro Abs: 3.1 10*3/uL (ref 1.7–7.7)
Neutrophils Relative %: 57 %
Platelets: 160 10*3/uL (ref 150–400)
RBC: 3.11 MIL/uL — ABNORMAL LOW (ref 3.87–5.11)
RDW: 16.7 % — ABNORMAL HIGH (ref 11.5–15.5)
WBC: 5.4 10*3/uL (ref 4.0–10.5)
nRBC: 0 % (ref 0.0–0.2)

## 2018-08-04 LAB — URINE CULTURE: Culture: >=100000 — AB

## 2018-08-04 LAB — BASIC METABOLIC PANEL
Anion gap: 5 (ref 5–15)
BUN: 10 mg/dL (ref 8–23)
CO2: 23 mmol/L (ref 22–32)
Calcium: 6.5 mg/dL — ABNORMAL LOW (ref 8.9–10.3)
Chloride: 113 mmol/L — ABNORMAL HIGH (ref 98–111)
Creatinine, Ser: 0.64 mg/dL (ref 0.44–1.00)
GFR calc Af Amer: >60 mL/min (ref >60)
GFR calc non Af Amer: >60 mL/min (ref >60)
Glucose, Bld: 79 mg/dL (ref 70–99)
Potassium: 4.3 mmol/L (ref 3.5–5.1)
Sodium: 141 mmol/L (ref 135–145)

## 2018-08-04 LAB — CALCIUM, IONIZED: Calcium, Ionized, Serum: 4 mg/dL — ABNORMAL LOW (ref 4.5–5.6)

## 2018-08-04 MED ORDER — LEVOTHYROXINE SODIUM 100 MCG PO TABS
200.0000 ug | ORAL_TABLET | Freq: Every day | ORAL | Status: DC
  Administered 2018-08-05: 200 ug via ORAL
  Filled 2018-08-04: qty 2

## 2018-08-04 MED ORDER — APIXABAN 5 MG PO TABS
5.0000 mg | ORAL_TABLET | Freq: Two times a day (BID) | ORAL | Status: DC
  Administered 2018-08-04 – 2018-08-05 (×2): 5 mg via ORAL
  Filled 2018-08-04 (×2): qty 1

## 2018-08-04 MED ORDER — HYDROCODONE-ACETAMINOPHEN 5-325 MG PO TABS
1.0000 | ORAL_TABLET | Freq: Four times a day (QID) | ORAL | Status: DC | PRN
  Administered 2018-08-04 – 2018-08-05 (×3): 1 via ORAL
  Filled 2018-08-04 (×3): qty 1

## 2018-08-04 NOTE — Progress Notes (Signed)
Subjective/Chief Complaint: Bowels are moving.  Films are much improved.  Abdominal distention is gone.  Her NG tube is out and she is having no nausea or vomiting   Objective: Vital signs in last 24 hours: Temp:  [98 F (36.7 C)-98.2 F (36.8 C)] 98 F (36.7 C) (06/14 0600) Pulse Rate:  [70-116] 70 (06/14 0600) Resp:  [18] 18 (06/14 0600) BP: (103-144)/(73-104) 103/73 (06/14 0600) SpO2:  [89 %-100 %] 98 % (06/14 0600) Last BM Date: (P) 08/03/18  Intake/Output from previous day: 06/13 0701 - 06/14 0700 In: 1578.5 [P.O.:240; I.V.:988.5; IV Piggyback:350] Out: -  Intake/Output this shift: No intake/output data recorded.  General appearance: alert and cooperative Head: Normocephalic, without obvious abnormality, atraumatic GI: Soft and nontender.  Scars noted.  No distention  Lab Results:  Recent Labs    08/03/18 0409 08/04/18 0446  WBC 7.0 5.4  HGB 10.4* 9.2*  HCT 34.5* 30.3*  PLT 172 160   BMET Recent Labs    08/03/18 0409 08/04/18 0446  NA 141 141  K 4.2 4.3  CL 110 113*  CO2 23 23  GLUCOSE 94 79  BUN 14 10  CREATININE 0.79 0.64  CALCIUM 6.8* 6.5*   PT/INR No results for input(s): LABPROT, INR in the last 72 hours. ABG No results for input(s): PHART, HCO3 in the last 72 hours.  Invalid input(s): PCO2, PO2  Studies/Results: Dg Chest Port 1 View  Result Date: 08/03/2018 CLINICAL DATA:  Shortness of breath. EXAM: PORTABLE CHEST 1 VIEW COMPARISON:  May 26, 2018 FINDINGS: The NG tube terminates below today's film. Stable cardiomegaly. The hila and mediastinum are unchanged. The lungs are clear. No other acute abnormalities. IMPRESSION: No acute interval change. An NG tube is been placed and terminates below today's film. Continued cardiomegaly. Electronically Signed   By: Dorise Bullion III M.D   On: 08/03/2018 09:31   Dg Abd 2 Views  Result Date: 08/03/2018 CLINICAL DATA:  Small-bowel obstruction EXAM: ABDOMEN - 2 VIEW COMPARISON:  08/02/2018  FINDINGS: Retained contrast in colon and appendix. Gas within colon as well. No definite small bowel dilatation. No gross free air. Bones demineralized. Numerous surgical clips in BILATERAL upper quadrants. Nasogastric tube extends into stomach. IMPRESSION: Contrast throughout colon to rectum. No small bowel dilatation. Electronically Signed   By: Lavonia Dana M.D.   On: 08/03/2018 13:58   Dg Abd Portable 1v  Result Date: 08/02/2018 CLINICAL DATA:  Bowel obstruction. EXAM: PORTABLE ABDOMEN - 1 VIEW COMPARISON:  08/01/2018.  07/31/2018 FINDINGS: Orogastric or nasogastric tube in the gastroesophageal junction/surgical region. Patient in use to have gas within small and large bowel and contrast within the colon. Contrast has progressed slightly. IMPRESSION: Orogastric or nasogastric tube in a similar position in the region of the upper surgical changes. Slight progression of contrast within the colon. Electronically Signed   By: Nelson Chimes M.D.   On: 08/02/2018 11:19    Anti-infectives: Anti-infectives (From admission, onward)   Start     Dose/Rate Route Frequency Ordered Stop   08/03/18 1700  cefTRIAXone (ROCEPHIN) 1 g in sodium chloride 0.9 % 100 mL IVPB     1 g 200 mL/hr over 30 Minutes Intravenous Every 24 hours 08/03/18 1555        Assessment/Plan: Morbid obesity BMI 43.2 Recent seizures 07/6061 Chronic diastolic heart failure/cardiomyopathy Chronic atrial fibrillation-on Eliquis(last dose 6/9) Hypothyroid-prior thyroidectomy Severe osteoarthritis with limited mobility-walker dependent Fibromyalgia  Remote Hx asthma Right nephrolithiasis/nonobstructing Hx of recurrent nephrolithiasis  Recurrent small  bowel obstruction -Hx of gastroplasty/gastroplasty revision/laparotomy/laparoscopy x3 for adhesions. Hx cholecystectomy/tubal ligation/renal calculus extraction x2 -CT 6/10 showed SBO with transition point in the RUQ likely due to adhesions or related to  previous anastomosis -small bowel protoco l>>contrast in colon  FEN:CLD/NG tubeclamped/IV fluids  ID: None DVT: IV heparin Follow-up: To be to determined  POC: Spranger,Theodore Spouse   [phone]  [place] Niece [phone]    hough,cyara Denman George   [phone]   Plan:Having bowel function  No nausea or vomiting yesterday on clears NG tube out  Advance diet  Home Monday if she tolerates diet    LOS: 4 days    Turner Daniels 08/04/2018

## 2018-08-04 NOTE — Progress Notes (Signed)
PROGRESS NOTE  Jillian Eaton Pen PYK:[id number] DOB: 1950-12-13 DOA: 07/31/2018 PCP: [name], MD  HPI/Recap of past 24 hours: HPI from Dr Jillian Eaton is a 68 y.o. 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 Pt denies any diarrhea, no fevers or chills. In the ED, CT abdomen pelvis was notable for small bowel obstruction with transition point in the right upper quadrant. Pt admitted for SBO and general surgery was consulted.   Today, patient denies any new complaints.  Reports feeling slightly better.  Still with abdominal pain.   Assessment/Plan: Principal Problem:   SBO (small bowel obstruction) (HCC) Active Problems:   Morbid obesity with body mass index of 40.0-49.9 (HCC)   Pulmonary hypertension (HCC)   Seizures (HCC)   Chronic atrial fibrillation   Chronic pain syndrome   Fibromyalgia   GERD (gastroesophageal reflux disease)   Postoperative hypothyroidism   On anticoagulant therapy   Chronic diastolic CHF (congestive heart failure) (HCC)   CKD (chronic kidney disease) stage 3, GFR 30-59 ml/min (HCC)   SBO (small bowel obstruction) Improving Likely secondary to multiple prior surgeries, adhesions General surgery on board Monitor closely  UTI UA positive for nitrites, leukocytes, WBC UC grew >100,000  negative rods Continue Rocephin  Hypocalcemia Noted, will order ionized calcium pending Gave 1 dose of ca. gluconate  Paroxysmal atrial fibrillation Rate better controlled D-dimer negative Continue p.o. Lopressor, IV Lopressor as needed Hold Eliquis for now, continue Lovenox in the interim  Chronic diastolic CHF  Appears clinically euvolemic BNP around baseline Chest x-ray unremarkable Held Lasix Monitor closely  Seizures Continue Keppra changed to IV  Chronic pain syndrome/fibro-myalgia Holding tramadol, start Flexeril for now  Postoperative hypothyroidism Change Synthroid to IV  Morbid obesity Advised lifestyle modification         Malnutrition Type:      Malnutrition Characteristics:      Nutrition Interventions:       Estimated body mass index is 43.36 kg/m as calculated from the following:   Height as of this encounter: 5\' 5"  (1.651 m).   Weight as of this encounter: 118.2 kg.     Code Status: Full  Family Communication: None at bedside  Disposition Plan: Once significant clinical improvement   Consultants:  General surgery  Procedures:  None  Antimicrobials:  Ceftriaxone  DVT prophylaxis: Lovenox   Objective: Vitals:   08/04/18 0000 08/04/18 0600 08/04/18 1050 08/04/18 1318  BP:  103/73  (!) 158/100  Pulse:  70 90 77  Resp:  18  19  Temp:  98 F (36.7 C)    TempSrc:      SpO2: 100% 98% 94% 94%  Weight:      Height:        Intake/Output Summary (Last 24 hours) at 08/04/2018 1433 Last data filed at 08/04/2018 1330 Gross per 24 hour  Intake 1578.49 ml  Output 200 ml  Net 1378.49 ml   [employer]  07/31/18 0137 07/31/18 1739  Weight: 117.9 kg 118.2 kg    Exam:  General: NAD   Cardiovascular: S1, S2 present  Respiratory: CTAB  Abdomen: Soft, nontender, obese, +bowel sounds present  Musculoskeletal: No bilateral pedal edema noted  Skin: Normal  Psychiatry: Normal mood   Data Reviewed:  CBC: Recent Labs  Lab 07/31/18 0334 08/01/18 0558 08/02/18 0640 08/03/18 0409 08/04/18 0446  WBC 7.5 7.6 8.1 7.0 5.4  NEUTROABS 5.3  --  6.3 4.9 3.1  HGB 14.2 12.3 11.2* 10.4* 9.2*  HCT 44.8 42.3 37.7 34.5* 30.3*  MCV 92.9 98.4 98.4 97.5 97.4  PLT 203 166 172 172 299   Basic Metabolic Panel: Recent Labs  Lab 07/31/18 0334 07/31/18 0521 08/01/18 0558 08/02/18 0640 08/03/18 0409 08/04/18 0446  NA 139  --  141 142 141 141  K 6.8* 4.4 4.5 4.1 4.2 4.3  CL 106  --  109 107 110 113*  CO2 25  --  20* 25 23 23   GLUCOSE 101*  --  83 92 94 79  BUN 22  --  17 15 14 10   CREATININE 0.87  --  0.99 0.84 0.79 0.64  CALCIUM 8.2*  --  7.5* 6.7* 6.8* 6.5*   GFR: Estimated Creatinine Clearance: 89 mL/min (by C-G formula based on SCr of 0.64 mg/dL). Liver Function Tests: Recent Labs  Lab 07/31/18 0334 08/01/18 0558  AST 25 24  ALT 12 16  ALKPHOS 83 84  BILITOT 0.7 0.5  PROT 8.0 7.4  ALBUMIN 4.0 3.7   No results for input(s): LIPASE, AMYLASE in the last 168 hours. No results for input(s): AMMONIA in the last 168 hours. Coagulation Profile: No results for input(s): INR, PROTIME in the last 168 hours. Cardiac Enzymes: No results for input(s): CKTOTAL, CKMB, CKMBINDEX, TROPONINI in the last 168 hours. BNP (last 3 results) No results for input(s): PROBNP in the last 8760 hours. HbA1C: No results for input(s): HGBA1C in the last 72 hours. CBG: No results for input(s): GLUCAP in the last 168 hours. Lipid Profile: No results for input(s): CHOL, HDL, LDLCALC, TRIG, CHOLHDL, LDLDIRECT in the last 72 hours. Thyroid Function Tests: No results for input(s): TSH, T4TOTAL, FREET4, T3FREE, THYROIDAB in the last 72 hours. Anemia Panel: No results for input(s): VITAMINB12, FOLATE, FERRITIN, TIBC, IRON, RETICCTPCT in the last 72 hours. Urine analysis:    Component Value Date/Time   COLORURINE YELLOW 07/31/2018 0657   APPEARANCEUR HAZY (A) 07/31/2018 0657   LABSPEC 1.015 07/31/2018 0657    PHURINE 5.0 07/31/2018 0657   GLUCOSEU NEGATIVE 07/31/2018 0657   HGBUR MODERATE (A) 07/31/2018 0657   BILIRUBINUR NEGATIVE 07/31/2018 0657   KETONESUR NEGATIVE 07/31/2018 0657   PROTEINUR NEGATIVE 07/31/2018 0657   UROBILINOGEN 1.0 06/06/2013 2154   NITRITE POSITIVE (A) 07/31/2018 0657   LEUKOCYTESUR SMALL (A) 07/31/2018 0657   Sepsis Labs: @LABRCNTIP (procalcitonin:4,lacticidven:4)  ) Recent Results (from the past 240 hour(s))  SARS Coronavirus 2 (CEPHEID - Performed in [place] hospital lab), Hosp Order     Status: None   Collection Time: 07/31/18  6:34 AM   Specimen: Nasopharyngeal Swab  Result Value Ref Range Status   SARS Coronavirus 2 NEGATIVE NEGATIVE Final    Comment: (NOTE) If result is NEGATIVE SARS-CoV-2 target nucleic acids are NOT DETECTED. The SARS-CoV-2 RNA is generally detectable in upper and lower  respiratory specimens during the acute phase of infection. The lowest  concentration of SARS-CoV-2 viral copies this assay can detect is 250  copies / mL.  A negative result does not preclude SARS-CoV-2 infection  and should not be used as the sole basis for treatment or other  patient management decisions.  A negative result may occur with  improper specimen collection / handling, submission of specimen other  than nasopharyngeal swab, presence of viral mutation(s) within the  areas targeted by this assay, and inadequate number of viral copies  (<250 copies / mL). A negative result must be combined with clinical  observations, patient history, and epidemiological information. If result is POSITIVE SARS-CoV-2 target nucleic acids are DETECTED. The SARS-CoV-2 RNA is generally detectable in upper and lower  respiratory specimens dur ing the acute phase of infection.  Positive  results are indicative of active infection with SARS-CoV-2.  Clinical  correlation with patient history and other diagnostic information is  necessary to determine patient infection status.   Positive results do  not rule out bacterial infection or co-infection with other viruses. If result is PRESUMPTIVE POSTIVE SARS-CoV-2 nucleic acids MAY BE PRESENT.   A presumptive positive result was obtained on the submitted specimen  and confirmed on repeat testing.  While 2019 novel coronavirus  (SARS-CoV-2) nucleic acids may be present in the submitted sample  additional confirmatory testing may be necessary for epidemiological  and / or clinical management purposes  to differentiate between  SARS-CoV-2 and other Sarbecovirus currently known to infect humans.  If clinically indicated additional testing with an alternate test  methodology [phone]) is advised. The SARS-CoV-2 RNA is generally  detectable in upper and lower respiratory sp ecimens during the acute  phase of infection. The expected result is Negative. Fact Sheet for Patients:  StrictlyIdeas.no Fact Sheet for Healthcare Providers: BankingDealers.co.za This test is not yet approved or cleared by the [place] FDA and has been authorized for detection and/or diagnosis of SARS-CoV-2 by FDA under an Emergency Use Authorization (EUA).  This EUA will remain in effect (meaning this test can be used) for the duration of the COVID-19 declaration under Section 564(b)(1) of the Act, 21 U.S.C. section 360bbb-3(b)(1), unless the authorization is terminated or revoked sooner. Performed at [facility], [place] [address], [place], [place] [address]   Urine Culture     Status: Abnormal   Collection Time: 08/01/18  3:14 PM   Specimen: Urine, Clean Catch  Result Value Ref Range Status   Specimen Description   Final    URINE, CLEAN CATCH Performed at [facility], [place] [address] [place], [place] [address]    Special Requests   Final    NONE Performed at [facility], [place] [address], [place], [place] [address]    Culture  >=100,000 COLONIES/mL ESCHERICHIA COLI (A)  Final   Report Status 08/04/2018 FINAL  Final   Organism ID, Bacteria ESCHERICHIA COLI (A)  Final      Susceptibility   Escherichia coli - MIC*    AMPICILLIN 4 SENSITIVE Sensitive     CEFAZOLIN <=4 SENSITIVE Sensitive     CEFTRIAXONE <=1 SENSITIVE Sensitive     CIPROFLOXACIN <=0.25 SENSITIVE Sensitive     GENTAMICIN <=1 SENSITIVE Sensitive     IMIPENEM <=0.25 SENSITIVE Sensitive     NITROFURANTOIN 32 SENSITIVE Sensitive     TRIMETH/SULFA <=20 SENSITIVE Sensitive     AMPICILLIN/SULBACTAM <=2 SENSITIVE Sensitive     PIP/TAZO <=4 SENSITIVE Sensitive     Extended ESBL NEGATIVE Sensitive     * >=100,000 COLONIES/mL ESCHERICHIA COLI      Studies: No results found.  Scheduled Meds: . bisacodyl  10 mg Rectal Daily  . cyclobenzaprine  20 mg Oral BID  . DULoxetine  60 mg Oral Daily  . enoxaparin (LOVENOX) injection  120 mg Subcutaneous Q12H  . [START ON 08/05/2018] levothyroxine  200 mcg Oral Q0600  . lip balm  1 application Topical BID  . metoprolol tartrate  50 mg Oral BID  . ondansetron (ZOFRAN) IV  4 mg Intravenous Once  . traZODone  50 mg Oral QHS    Continuous Infusions: . sodium chloride 250 mL (08/02/18 1216)  . cefTRIAXone (ROCEPHIN)  IV 1 g (08/03/18 1620)  . levETIRAcetam 500 mg (08/04/18 1032)  . ondansetron (ZOFRAN) IV       LOS: 4 days     Alma Friendly, MD [facility]  If 7PM-7AM, please contact night-coverage [email or web address] 08/04/2018, 2:33 PM

## 2018-08-04 NOTE — Progress Notes (Signed)
Pharmacy - Lovenox >> Eliquis  Assessment:    Briefly, 68 y.o. female known to pharmacy from heparin and enoxaparin dosing, now transitioning back to Eliquis (PTA regimen)   Takes Eliquis 5 mg PO bid at home  Last dose of Lovenox at 1027 today  Plan:   Stop Lovenox  Resume Eliquis 5 mg PO bid tonight at [place] will sign off, following peripherally for changes in clinical status   Reuel Boom, PharmD, BCPS [phone] 08/04/2018, 2:47 PM

## 2018-08-05 ENCOUNTER — Telehealth: Payer: Medicare Other | Admitting: Internal Medicine

## 2018-08-05 DIAGNOSIS — K219 Gastro-esophageal reflux disease without esophagitis: Secondary | ICD-10-CM

## 2018-08-05 LAB — CBC WITH DIFFERENTIAL/PLATELET
Abs Immature Granulocytes: 0.01 10*3/uL (ref 0.00–0.07)
Basophils Absolute: 0 10*3/uL (ref 0.0–0.1)
Basophils Relative: 1 %
Eosinophils Absolute: 0.2 10*3/uL (ref 0.0–0.5)
Eosinophils Relative: 5 %
HCT: 29.3 % — ABNORMAL LOW (ref 36.0–46.0)
Hemoglobin: 9 g/dL — ABNORMAL LOW (ref 12.0–15.0)
Immature Granulocytes: 0 %
Lymphocytes Relative: 31 %
Lymphs Abs: 1.3 10*3/uL (ref 0.7–4.0)
MCH: 29.2 pg (ref 26.0–34.0)
MCHC: 30.7 g/dL (ref 30.0–36.0)
MCV: 95.1 fL (ref 80.0–100.0)
Monocytes Absolute: 0.5 10*3/uL (ref 0.1–1.0)
Monocytes Relative: 13 %
Neutro Abs: 2.2 10*3/uL (ref 1.7–7.7)
Neutrophils Relative %: 50 %
Platelets: 164 10*3/uL (ref 150–400)
RBC: 3.08 MIL/uL — ABNORMAL LOW (ref 3.87–5.11)
RDW: 16.5 % — ABNORMAL HIGH (ref 11.5–15.5)
WBC: 4.2 10*3/uL (ref 4.0–10.5)
nRBC: 0 % (ref 0.0–0.2)

## 2018-08-05 MED ORDER — LEVETIRACETAM 500 MG PO TABS: 500.0000 mg | ORAL_TABLET | Freq: Two times a day (BID) | ORAL | Status: DC

## 2018-08-05 NOTE — Consult Note (Signed)
   [facility] CM Inpatient Consult   08/05/2018  Jillian Eaton [facility] 10/22/1950 [id number]   Patient screened for potential [facility] Care Management services due to unplanned readmission risk score of 31%, extreme and multiple hospitalizations.   Per chart review, patient's primary physician is listed as [name], MD. Patient not eligible for [facility] Care Management services as primary MD is not in [facility] network of physicians.  [facility] will not follow at this time.  Netta Cedars, MSN, [facility] Liaison Nurse Mobile Phone [phone]  Toll free office [phone]

## 2018-08-05 NOTE — Plan of Care (Signed)
  Problem: Education: Goal: Knowledge of General Education information will improve Description: Including pain rating scale, medication(s)/side effects and non-pharmacologic comfort measures 08/05/2018 1305 by Jayde Mcallister, Scarlett Presto, RN Outcome: Adequate for Discharge 08/05/2018 1304 by Alasia Enge W, RN Outcome: Adequate for Discharge   Problem: Health Behavior/Discharge Planning: Goal: Ability to manage health-related needs will improve 08/05/2018 1305 by Maridee Slape, Scarlett Presto, RN Outcome: Adequate for Discharge 08/05/2018 1304 by Ethelyn Cerniglia W, RN Outcome: Adequate for Discharge   Problem: Clinical Measurements: Goal: Ability to maintain clinical measurements within normal limits will improve 08/05/2018 1305 by Salene Mohamud W, RN Outcome: Adequate for Discharge 08/05/2018 1304 by Mathilda Maguire W, RN Outcome: Adequate for Discharge Goal: Will remain free from infection 08/05/2018 1305 by Tiasia Weberg,  W, RN Outcome: Adequate for Discharge 08/05/2018 1304 by Vianca Bracher W, RN Outcome: Adequate for Discharge Goal: Diagnostic test results will improve 08/05/2018 1305 by Misti Towle, Scarlett Presto, RN Outcome: Adequate for Discharge 08/05/2018 1304 by Roberto Romanoski W, RN Outcome: Adequate for Discharge Goal: Respiratory complications will improve 08/05/2018 1305 by Dekisha Mesmer, Scarlett Presto, RN Outcome: Adequate for Discharge 08/05/2018 1304 by Anais Koenen W, RN Outcome: Adequate for Discharge Goal: Cardiovascular complication will be avoided 08/05/2018 1305 by Taneshia Lorence, Scarlett Presto, RN Outcome: Adequate for Discharge 08/05/2018 1304 by Handy Mcloud, Scarlett Presto, RN Outcome: Adequate for Discharge   Problem: Activity: Goal: Risk for activity intolerance will decrease 08/05/2018 1305 by Azzie Thiem, Scarlett Presto, RN Outcome: Adequate for Discharge 08/05/2018 1304 by Graceanne Guin W, RN Outcome: Adequate for Discharge   Problem: Nutrition: Goal: Adequate nutrition will be maintained 08/05/2018 1305 by Keela Rubert, Scarlett Presto, RN Outcome: Adequate for  Discharge 08/05/2018 1304 by Breckin Savannah W, RN Outcome: Adequate for Discharge   Problem: Coping: Goal: Level of anxiety will decrease 08/05/2018 1305 by Jachelle Fluty,  W, RN Outcome: Adequate for Discharge 08/05/2018 1304 by Momin Misko W, RN Outcome: Adequate for Discharge   Problem: Elimination: Goal: Will not experience complications related to bowel motility 08/05/2018 1305 by Bernardo Brayman, Scarlett Presto, RN Outcome: Adequate for Discharge 08/05/2018 1304 by Eleftherios Dudenhoeffer W, RN Outcome: Adequate for Discharge Goal: Will not experience complications related to urinary retention 08/05/2018 1305 by Denver Bentson, Scarlett Presto, RN Outcome: Adequate for Discharge 08/05/2018 1304 by Earma Nicolaou W, RN Outcome: Adequate for Discharge   Problem: Pain Managment: Goal: General experience of comfort will improve 08/05/2018 1305 by Chananya Canizalez, Scarlett Presto, RN Outcome: Adequate for Discharge 08/05/2018 1304 by Kenyada Dosch W, RN Outcome: Adequate for Discharge   Problem: Safety: Goal: Ability to remain free from injury will improve 08/05/2018 1305 by Damira Kem W, RN Outcome: Adequate for Discharge 08/05/2018 1304 by Rosalita Carey W, RN Outcome: Adequate for Discharge   Problem: Skin Integrity: Goal: Risk for impaired skin integrity will decrease 08/05/2018 1305 by , Scarlett Presto, RN Outcome: Adequate for Discharge 08/05/2018 1304 by , Scarlett Presto, RN Outcome: Adequate for Discharge

## 2018-08-05 NOTE — Discharge Summary (Signed)
Discharge Summary  Jillian Eaton [facility] ENI:[id number] DOB: 1950-03-29  PCP: [name], MD  Admit date: 07/31/2018 Discharge date: 08/05/2018  Time spent: 35 mins  Recommendations for Outpatient Follow-up:  1. PCP   Discharge Diagnoses:  Active Hospital Problems   Diagnosis Date Noted   SBO (small bowel obstruction) (HCC) 06/07/2013   Chronic diastolic CHF (congestive heart failure) ([place]) 09/28/2017   CKD (chronic kidney disease) stage 3, GFR 30-59 ml/min (HCC) 09/07/2017   On anticoagulant therapy 06/19/2017   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   GERD (gastroesophageal reflux disease) 08/28/2016   Chronic pain syndrome    Chronic atrial fibrillation 04/28/2016   Seizures ([place])    Pulmonary hypertension ([place]) 01/08/2013   Fibromyalgia 10/26/2011   Morbid obesity with body mass index of 40.0-49.9 ([place]) 09/22/2010    Resolved Hospital Problems  No resolved problems to display.    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 Patient reports being in her usual until after having [employer] for dinner,subsequently started having diffuse abdominal pain, slightly worse in the right upper quadrant followed by nausea and vomiting,she has a prior history of small bowel obstruction, history of gastro-plexyin the early90s followed by revision, multiple hospitalizations for SBO in the 90s in [place] and [place]. Pt denies any  diarrhea,no fevers or chills. In the ED, CT abdomen pelvis was notable for small bowel obstruction with transition point in the right upper quadrant. Pt admitted for SBO and general surgery was consulted.   Today, patient reported feeling a whole lot better, denies any new complaints, denies chest pain, shortness of breath, abdominal pain, nausea/vomiting, dizziness.  Patient is stable to be discharged home with home health PT.   Hospital Course:  Principal Problem:   SBO (small bowel obstruction) (HCC) Active Problems:   Morbid obesity with body mass index of 40.0-49.9 (HCC)   Pulmonary hypertension (HCC)   Seizures (HCC)   Chronic atrial fibrillation   Chronic pain syndrome   Fibromyalgia   GERD (gastroesophageal reflux disease)   Postoperative hypothyroidism   On anticoagulant therapy   Chronic diastolic CHF (congestive heart failure) (HCC)   CKD (chronic kidney disease) stage 3, GFR 30-59 ml/min (HCC)  SBO (small bowel obstruction) Resolving Likely secondary to multiple prior surgeries, adhesions General surgery on board, ok to d/c  Follow up with PCP  UTI UA positive for nitrites, leukocytes, WBC UC grew >100,000 negative rods S/P Rocephin X 3 days  Hypocalcemia Ongoing prior to this admission Ionized calcium low Gave 1 dose of ca. Gluconate, continue home Ca supplement regimen  Paroxysmal atrial fibrillation Rate controlled D-dimer negative Continue home metoprolol, eliquis  Chronic diastolic CHF Appears clinically euvolemic BNP around baseline Chest x-ray unremarkable Continue home Lasix  Seizures Continue Keppra  Chronic pain syndrome/fibro-myalgia Continue home tramadol, Flexeril  Postoperative hypothyroidism Continue Synthroid  Morbid obesity Advised lifestyle modification           Malnutrition Type:      Malnutrition Characteristics:      Nutrition Interventions:  Estimated body mass index is 43.36 kg/m as  calculated from the following:   Height as of this encounter: 5\' 5"  (1.651 m).   Weight as of this encounter: 118.2 kg.    Procedures:  None  Consultations:  General surgery   Discharge Exam: BP 109/84 (BP Location: Right Arm)    Pulse 82    Temp 98 F (36.7 C)    Resp 20    Ht 5\' 5"  (1.651 m)    Wt 118.2 kg    SpO2 91%    BMI 43.36 kg/m   General: NAD Cardiovascular: S1, S2 present Respiratory: CTAB  Discharge Instructions You were cared for by a hospitalist during your hospital stay. If you have any questions about your discharge medications or the care you received while you were in the hospital after you are discharged, you can call the unit and asked to speak with the hospitalist on call if the hospitalist that took care of you is not available. Once you are discharged, your primary care physician will handle any further medical issues. Please note that NO REFILLS for any discharge medications will be authorized once you are discharged, as it is imperative that you return to your primary care physician (or establish a relationship with a primary care physician if you do not have one) for your aftercare needs so that they can reassess your need for medications and monitor your lab values.   Allergies as of 08/05/2018      Reactions   Hydrocodone Itching      Medication List    STOP taking these medications   Lyrica 200 MG capsule Generic drug: pregabalin     TAKE these medications   acetaminophen 650 MG CR tablet Commonly known as: TYLENOL Take 1,300 mg by mouth 3 (three) times daily. At 6 am, 2 pm, and 10 pm   apixaban 5 MG Tabs tablet Commonly known as: ELIQUIS Take 1 tablet (5 mg total) by mouth 2 (two) times daily.   calcitRIOL 0.5 MCG capsule Commonly known as: ROCALTROL TAKE 1 CAPSULE BY MOUTH TWICE A DAY What changed:   how much to take  how to take this  when to take this  additional instructions   calcium carbonate 1250 (500 Ca) MG  tablet Commonly known as: OS-CAL - dosed in mg of elemental calcium Take 2 tablets (1,000 mg of elemental calcium total) by mouth 2 (two) times daily. What changed: when to take this   clotrimazole 1 % cream Commonly known as: Lotrimin AF Apply 1 application topically 2 (two) times daily.   cyanocobalamin 1000 MCG/ML injection Commonly known as: (VITAMIN B-12) Inject 1 mL (1,000 mcg total) into the muscle every 30 (thirty) days.   cyclobenzaprine 10 MG tablet Commonly known as: FLEXERIL Take 2 tablets (20 mg total) by mouth 2 (two) times daily.   diclofenac sodium 1 % Gel Commonly known as: VOLTAREN Apply 2 g topically 2 (two) times daily as needed (for knee pain).   DULoxetine 60 MG capsule Commonly known as: CYMBALTA Take 1 capsule (60 mg total) by mouth daily.   eszopiclone 2 MG Tabs tablet Commonly known as: LUNESTA Take 1 tablet (2 mg total) by mouth at bedtime.   Ferrex 150 150 MG capsule Generic drug: iron polysaccharides Take 150 mg by mouth 2 (two) times daily.   furosemide 20 MG tablet Commonly known as: LASIX Take 1 tablet (20 mg total) by mouth daily as needed (swelling).   levETIRAcetam 500 MG tablet  Commonly known as: KEPPRA TAKE 1 TABLET BY MOUTH TWICE A DAY   levothyroxine 200 MCG tablet Commonly known as: SYNTHROID TAKE 1 TABLET BY MOUTH EVERY DAY BEFORE BREAKFAST What changed: See the new instructions.   magnesium oxide 400 MG tablet Commonly known as: MAG-OX Take 1 tablet (400 mg total) by mouth daily.   metoprolol succinate 50 MG 24 hr tablet Commonly known as: TOPROL-XL TAKE ONE TABLET BY MOUTH IN THE EVENING Take with or immediately following a meal. TAKE 25 MG TABLETS IN THE MORNING What changed:   how much to take  how to take this  when to take this   metoprolol succinate 25 MG 24 hr tablet Commonly known as: TOPROL-XL TAKE 1 TABLET BY MOUTH IN AM AND TAKE YOUR 50 MG TABLETS IN THE EVENING TAKE WITH FOOD What changed:   how  much to take  how to take this  when to take this   mirabegron ER 50 MG Tb24 tablet Commonly known as: Myrbetriq Take 1 tablet (50 mg total) by mouth daily.   NONFORMULARY OR COMPOUNDED ITEM [facility]:  Antifungal Cream - Terbinafine 3%, Fluconazole 2%, Tea Tree Oil 5%, Urea 10%, Ibuprofen 2% in DMSO Suspension #88ml. Apply to affected toenail(s) once daily (at bedtime) or twice daily.   senna 8.6 MG Tabs tablet Commonly known as: SENOKOT Take 1 tablet (8.6 mg total) by mouth daily.   traMADol 50 MG tablet Commonly known as: ULTRAM Take 50 mg by mouth every 6 (six) hours as needed for moderate pain.   traZODone 50 MG tablet Commonly known as: DESYREL Take 1 tablet (50 mg total) by mouth at bedtime.   Vitamin D (Ergocalciferol) 1.25 MG (50000 UT) Caps capsule Commonly known as: DRISDOL Take 1 capsule (50,000 Units total) by mouth every Wednesday.      Allergies  Allergen Reactions   Hydrocodone Itching   Follow-up Information    [facility], [place]. Call.   Specialty: General Surgery Why: as needed Contact information: [address] [place] [address] [phone]            The results of significant diagnostics from this hospitalization (including imaging, microbiology, ancillary and laboratory) are listed below for reference.    Significant Diagnostic Studies: Dg Abd 1 View  Result Date: 08/01/2018 CLINICAL DATA:  NG tube placement. EXAM: ABDOMEN - 1 VIEW COMPARISON:  One-view abdomen 07/31/2018 FINDINGS: Tip the NG tube is at the suture line of the gastrectomy. Side port is just above this level. Dilated loops of bowel are again noted. IMPRESSION: 1. Side port of the NG tube is just above the GE junction. Electronically Signed   By: San Morelle M.D.   On: 08/01/2018 08:38   Dg Abdomen 1 View  Result Date: 07/31/2018 CLINICAL DATA:  Nasogastric tube placement EXAM: ABDOMEN - 1 VIEW COMPARISON:  CT abdomen and pelvis  July 31, 2018. FINDINGS: Nasogastric tube tip and side port are in the proximal stomach. Visualized bowel is distended. No free air evident. Visualized lungs are clear. There is cardiomegaly. IMPRESSION: Nasogastric tube tip and side port in proximal stomach. Bowel dilatation persists. No free air. Cardiomegaly. Visualized lungs clear. Electronically Signed   By: Lowella Grip III M.D.   On: 07/31/2018 07:54   Ct Abdomen Pelvis W Contrast  Result Date: 07/31/2018 CLINICAL DATA:  Left lower quadrant abdominal pain beginning at 6 o'clock p.m. Previous small-bowel obstructions. EXAM: CT ABDOMEN AND PELVIS WITH CONTRAST TECHNIQUE: Multidetector CT imaging  of the abdomen and pelvis was performed using the standard protocol following bolus administration of intravenous contrast. CONTRAST:  115mL OMNIPAQUE IOHEXOL 300 MG/ML  SOLN COMPARISON:  CT of the abdomen and pelvis 06/05/2017 at [facility]. CT of the abdomen and pelvis 10/03/2013 at [facility]. FINDINGS: Lower chest: Heart is enlarged, particularly the right atrium. This is stable. No significant pleural or pericardial effusion is present. Mild dependent atelectasis is present at both lung bases. Hepatobiliary: Moderate intra and extrahepatic biliary dilation is present. The patient is status post cholecystectomy. No obstructing lesion is present. No discrete hepatic lesions are present. Pancreas: Unremarkable. No pancreatic ductal dilatation or surrounding inflammatory changes. Spleen: Normal in size without focal abnormality. Adrenals/Urinary Tract: The adrenal glands are normal bilaterally. A 10 mm nonobstructing stone is present at the lower pole of the right kidney. Additional punctate nonobstructing stone is present at the upper pole of the right kidney. No other mass lesion is present. There is no obstruction. Ureters are normal. The urinary bladder is decompressed. Stomach/Bowel: Partial gastrectomy is again  noted. Small bowel is diffusely dilated. There is marked dilation in the right upper quadrant at the level of a previous anastomosis, just proximal to the colon. Colon is decompressed. Appendix is visualized and normal. Vascular/Lymphatic: No significant vascular calcifications or adenopathy is present Reproductive: Calcified fibroid uterus is again noted. Adnexa are unremarkable. Other: Minimal free fluid is present within the anatomic pelvis. Musculoskeletal: Stable grade 1 anterolisthesis is present at L4-5. Vertebral body heights and alignment otherwise normal. Mild leftward curvature is present. Bony pelvis is intact. Hips are located and within normal limits. IMPRESSION: 1. Small bowel obstruction. The transition point is in the right upper quadrant likely due to adhesions or related to previous anastomosis. 2. No focal perforation. 3. There is some free fluid within the anatomic pelvis. 4. Nonobstructing 10 mm right renal calculus. 5. Moderate intra and extrahepatic biliary dilation without significant interval change. No focal etiology is evident. Electronically Signed   By: San Morelle M.D.   On: 07/31/2018 06:21   Dg Chest Port 1 View  Result Date: 08/03/2018 CLINICAL DATA:  Shortness of breath. EXAM: PORTABLE CHEST 1 VIEW COMPARISON:  May 26, 2018 FINDINGS: The NG tube terminates below today's film. Stable cardiomegaly. The hila and mediastinum are unchanged. The lungs are clear. No other acute abnormalities. IMPRESSION: No acute interval change. An NG tube is been placed and terminates below today's film. Continued cardiomegaly. Electronically Signed   By: Dorise Bullion III M.D   On: 08/03/2018 09:31   Dg Abd 2 Views  Result Date: 08/03/2018 CLINICAL DATA:  Small-bowel obstruction EXAM: ABDOMEN - 2 VIEW COMPARISON:  08/02/2018 FINDINGS: Retained contrast in colon and appendix. Gas within colon as well. No definite small bowel dilatation. No gross free air. Bones demineralized.  Numerous surgical clips in BILATERAL upper quadrants. Nasogastric tube extends into stomach. IMPRESSION: Contrast throughout colon to rectum. No small bowel dilatation. Electronically Signed   By: Lavonia Dana M.D.   On: 08/03/2018 13:58   Dg Abd Portable 1v  Result Date: 08/02/2018 CLINICAL DATA:  Bowel obstruction. EXAM: PORTABLE ABDOMEN - 1 VIEW COMPARISON:  08/01/2018.  07/31/2018 FINDINGS: Orogastric or nasogastric tube in the gastroesophageal junction/surgical region. Patient in use to have gas within small and large bowel and contrast within the colon. Contrast has progressed slightly. IMPRESSION: Orogastric or nasogastric tube in a similar position in the region of the upper surgical changes. Slight progression of  contrast within the colon. Electronically Signed   By: Nelson Chimes M.D.   On: 08/02/2018 11:19   Dg Abd Portable 1v-small Bowel Obstruction Protocol-initial, 8 Hr Delay  Result Date: 07/31/2018 CLINICAL DATA:  Small-bowel obstruction. EXAM: PORTABLE ABDOMEN - 1 VIEW COMPARISON:  07/31/2018 FINDINGS: Oral contrast is noted throughout the colon, to the level of the sigmoid colon and likely the rectum. IV contrast is noted in the patient's urinary bladder from prior contrast enhanced study. There is some gaseous distention of loops of colon and small bowel scattered throughout the abdomen. IMPRESSION: Oral contrast seen to the level of the sigmoid colon/rectum. Electronically Signed   By: Constance Holster M.D.   On: 07/31/2018 19:18    Microbiology: Recent Results (from the past 240 hour(s))  SARS Coronavirus 2 (CEPHEID - Performed in [facility] hospital lab), Hosp Order     Status: None   Collection Time: 07/31/18  6:34 AM   Specimen: Nasopharyngeal Swab  Result Value Ref Range Status   SARS Coronavirus 2 NEGATIVE NEGATIVE Final    Comment: (NOTE) If result is NEGATIVE SARS-CoV-2 target nucleic acids are NOT DETECTED. The SARS-CoV-2 RNA is generally detectable in upper and  lower  respiratory specimens during the acute phase of infection. The lowest  concentration of SARS-CoV-2 viral copies this assay can detect is 250  copies / mL. A negative result does not preclude SARS-CoV-2 infection  and should not be used as the sole basis for treatment or other  patient management decisions.  A negative result may occur with  improper specimen collection / handling, submission of specimen other  than nasopharyngeal swab, presence of viral mutation(s) within the  areas targeted by this assay, and inadequate number of viral copies  (<250 copies / mL). A negative result must be combined with clinical  observations, patient history, and epidemiological information. If result is POSITIVE SARS-CoV-2 target nucleic acids are DETECTED. The SARS-CoV-2 RNA is generally detectable in upper and lower  respiratory specimens dur ing the acute phase of infection.  Positive  results are indicative of active infection with SARS-CoV-2.  Clinical  correlation with patient history and other diagnostic information is  necessary to determine patient infection status.  Positive results do  not rule out bacterial infection or co-infection with other viruses. If result is PRESUMPTIVE POSTIVE SARS-CoV-2 nucleic acids MAY BE PRESENT.   A presumptive positive result was obtained on the submitted specimen  and confirmed on repeat testing.  While 2019 novel coronavirus  (SARS-CoV-2) nucleic acids may be present in the submitted sample  additional confirmatory testing may be necessary for epidemiological  and / or clinical management purposes  to differentiate between  SARS-CoV-2 and other Sarbecovirus currently known to infect humans.  If clinically indicated additional testing with an alternate test  methodology [phone]) is advised. The SARS-CoV-2 RNA is generally  detectable in upper and lower respiratory sp ecimens during the acute  phase of infection. The expected result is  Negative. Fact Sheet for Patients:  StrictlyIdeas.no Fact Sheet for Healthcare Providers: BankingDealers.co.za This test is not yet approved or cleared by the [place] FDA and has been authorized for detection and/or diagnosis of SARS-CoV-2 by FDA under an Emergency Use Authorization (EUA).  This EUA will remain in effect (meaning this test can be used) for the duration of the COVID-19 declaration under Section 564(b)(1) of the Act, 21 U.S.C. section 360bbb-3(b)(1), unless the authorization is terminated or revoked sooner. Performed at [facility], [place]  [address] [place], [place] [address]   Urine Culture     Status: Abnormal   Collection Time: 08/01/18  3:14 PM   Specimen: Urine, Clean Catch  Result Value Ref Range Status   Specimen Description   Final    URINE, CLEAN CATCH Performed at [facility], [place] [address] [place], [place] [address]    Special Requests   Final    NONE Performed at [facility], [place] [address] [place], [place] [address]    Culture >=100,000 COLONIES/mL ESCHERICHIA COLI (A)  Final   Report Status 08/04/2018 FINAL  Final   Organism ID, Bacteria ESCHERICHIA COLI (A)  Final      Susceptibility   Escherichia coli - MIC*    AMPICILLIN 4 SENSITIVE Sensitive     CEFAZOLIN <=4 SENSITIVE Sensitive     CEFTRIAXONE <=1 SENSITIVE Sensitive     CIPROFLOXACIN <=0.25 SENSITIVE Sensitive     GENTAMICIN <=1 SENSITIVE Sensitive     IMIPENEM <=0.25 SENSITIVE Sensitive     NITROFURANTOIN 32 SENSITIVE Sensitive     TRIMETH/SULFA <=20 SENSITIVE Sensitive     AMPICILLIN/SULBACTAM <=2 SENSITIVE Sensitive     PIP/TAZO <=4 SENSITIVE Sensitive     Extended ESBL NEGATIVE Sensitive     * >=100,000 COLONIES/mL ESCHERICHIA COLI     Labs: Basic Metabolic Panel: Recent Labs  Lab 07/31/18 0334 07/31/18 0521 08/01/18 0558 08/02/18 0640 08/03/18 0409 08/04/18 0446   NA 139  --  141 142 141 141  K 6.8* 4.4 4.5 4.1 4.2 4.3  CL 106  --  109 107 110 113*  CO2 25  --  20* 25 23 23   GLUCOSE 101*  --  83 92 94 79  BUN 22  --  17 15 14 10   CREATININE 0.87  --  0.99 0.84 0.79 0.64  CALCIUM 8.2*  --  7.5* 6.7* 6.8* 6.5*   Liver Function Tests: Recent Labs  Lab 07/31/18 0334 08/01/18 0558  AST 25 24  ALT 12 16  ALKPHOS 83 84  BILITOT 0.7 0.5  PROT 8.0 7.4  ALBUMIN 4.0 3.7   No results for input(s): LIPASE, AMYLASE in the last 168 hours. No results for input(s): AMMONIA in the last 168 hours. CBC: Recent Labs  Lab 07/31/18 0334 08/01/18 0558 08/02/18 0640 08/03/18 0409 08/04/18 0446 08/05/18 0405  WBC 7.5 7.6 8.1 7.0 5.4 4.2  NEUTROABS 5.3  --  6.3 4.9 3.1 2.2  HGB 14.2 12.3 11.2* 10.4* 9.2* 9.0*  HCT 44.8 42.3 37.7 34.5* 30.3* 29.3*  MCV 92.9 98.4 98.4 97.5 97.4 95.1  PLT 203 166 172 172 160 164   Cardiac Enzymes: No results for input(s): CKTOTAL, CKMB, CKMBINDEX, TROPONINI in the last 168 hours. BNP: BNP (last 3 results) Recent Labs    05/25/18 1837 05/26/18 1514 08/03/18 0409  BNP 181.0* 336.6* 321.9*    ProBNP (last 3 results) No results for input(s): PROBNP in the last 8760 hours.  CBG: No results for input(s): GLUCAP in the last 168 hours.     Signed:  Alma Friendly, MD [facility] 08/05/2018, 11:09 AM

## 2018-08-05 NOTE — Progress Notes (Signed)
Pt discharged from the unit via wheelchair. Discharge instructions were reviewed with the pt. No questions or concerns at this time. 

## 2018-08-05 NOTE — Progress Notes (Signed)
Subjective: CC:  Doing well this morning. Had some scrambled eggs for breakfast. No abdominal pain, N/V. Having loose BM's. Mobilizing in room  Objective: Vital signs in last 24 hours: Temp:  [98 F (36.7 C)] 98 F (36.7 C) (06/14 2003) Pulse Rate:  [77-90] 82 (06/15 0502) Resp:  [18-20] 20 (06/15 0502) BP: (109-158)/(84-100) 109/84 (06/15 0502) SpO2:  [91 %-96 %] 91 % (06/15 0502) Last BM Date: 08/04/18  Intake/Output from previous day: 06/14 0701 - 06/15 0700 In: 440 [P.O.:240; IV Piggyback:200] Out: 550 [Urine:550] Intake/Output this shift: No intake/output data recorded.  PE: Gen: Awake and alert, NAD Lungs: Normal rate and effort Abd: Soft, ND, NT, +BS Msk: DP 2+ b/l  Lab Results:  Recent Labs    08/04/18 0446 08/05/18 0405  WBC 5.4 4.2  HGB 9.2* 9.0*  HCT 30.3* 29.3*  PLT 160 164   BMET Recent Labs    08/03/18 0409 08/04/18 0446  NA 141 141  K 4.2 4.3  CL 110 113*  CO2 23 23  GLUCOSE 94 79  BUN 14 10  CREATININE 0.79 0.64  CALCIUM 6.8* 6.5*   PT/INR No results for input(s): LABPROT, INR in the last 72 hours. CMP     Component Value Date/Time   NA 141 08/04/2018 0446   NA 146 (H) 06/19/2017 1222   K 4.3 08/04/2018 0446   CL 113 (H) 08/04/2018 0446   CO2 23 08/04/2018 0446   GLUCOSE 79 08/04/2018 0446   BUN 10 08/04/2018 0446   BUN 21 06/19/2017 1222   CREATININE 0.64 08/04/2018 0446   CREATININE 1.01 (H) 11/27/2017 1029   CALCIUM 6.5 (L) 08/04/2018 0446   CALCIUM 5.5 (LL) 07/10/2017 2104   PROT 7.4 08/01/2018 0558   PROT 6.8 06/19/2017 1222   ALBUMIN 3.7 08/01/2018 0558   ALBUMIN 3.8 06/19/2017 1222   AST 24 08/01/2018 0558   ALT 16 08/01/2018 0558   ALKPHOS 84 08/01/2018 0558   BILITOT 0.5 08/01/2018 0558   BILITOT 0.4 06/19/2017 1222   GFRNONAA >60 08/04/2018 0446   GFRNONAA 58 (L) 11/27/2017 1029   GFRAA >60 08/04/2018 0446   GFRAA 68 11/27/2017 1029   Lipase  No results found for: LIPASE     Studies/Results:  Dg Abd 2 Views  Result Date: 08/03/2018 CLINICAL DATA:  Small-bowel obstruction EXAM: ABDOMEN - 2 VIEW COMPARISON:  08/02/2018 FINDINGS: Retained contrast in colon and appendix. Gas within colon as well. No definite small bowel dilatation. No gross free air. Bones demineralized. Numerous surgical clips in BILATERAL upper quadrants. Nasogastric tube extends into stomach. IMPRESSION: Contrast throughout colon to rectum. No small bowel dilatation. Electronically Signed   By: Lavonia Dana M.D.   On: 08/03/2018 13:58    Anti-infectives: Anti-infectives (From admission, onward)   Start     Dose/Rate Route Frequency Ordered Stop   08/03/18 1700  cefTRIAXone (ROCEPHIN) 1 g in sodium chloride 0.9 % 100 mL IVPB     1 g 200 mL/hr over 30 Minutes Intravenous Every 24 hours 08/03/18 1555         Assessment/Plan Morbid obesity BMI 43.2 Recent seizures 05/2018 Chronic dHF/cardiomyopathy Chronic atrial fibrillation-back on Eliquis Hypothyroid-prior thyroidectomy Severe osteoarthritis with limited mobility-walker dependent Fibromyalgia  Remote Hx asthma  Recurrent small bowel obstruction - Hx of gastroplasty/gastroplasty revision/laparotomy/laparoscopy x3 for adhesions. Hx cholecystectomy/tubal ligation/renal calculus extraction x2 - CT 6/10 showed SBO with transition point in the RUQ likely due to adhesions or related to previous anastomosis -  Small bowel protocol>>contrast in colon - No abdominal pain. Tolerating diet without N/V. Having bowel function. Contrast in colon on plain films. SBO clinically and radiographically has resolved. She is okay for discharge from a surgical standpoint.   FEN - Soft  VTE - SCD, Mobilize and Eliquis  ID - Rocephin 6/13>> no antibiotics indicated from a surgical standpoint on d/c   LOS: 5 days    Jillyn Ledger , [facility] 08/05/2018, 9:49 AM Pager: [phone]

## 2018-08-05 NOTE — Care Management Important Message (Signed)
Important Message  Patient Details IM Letter given to Cookie McGibboney RN to present to the Patient Name: Jillian Eaton MRN: [id number] Date of Birth: 1950/08/07   Medicare Important Message Given:  Yes    Kerin Salen 08/05/2018, 12:14 PM

## 2018-08-05 NOTE — Progress Notes (Signed)
Spoke with pt concerning HHPT. Pt declined at present time.

## 2018-08-05 NOTE — Discharge Instructions (Signed)
Bowel Obstruction  A bowel obstruction is a blockage in the small or large bowel. The bowel, which is also called the intestine, is a long, slender tube that connects the stomach to the anus. When a person eats and drinks, food and fluids go from the mouth to the stomach to the small bowel. This is where most of the nutrients in the food and fluids are absorbed. After the small bowel, material passes through the large bowel for further absorption until any leftover material leaves the body as stool through the anus during a bowel movement.  A bowel obstruction will prevent food and fluids from passing through the bowel as they normally do during digestion. The bowel can become partially or completely blocked. If this condition is not treated, it can be dangerous because the bowel could rupture.  What are the causes?  Common causes of this condition include:  · Scar tissue (adhesions) from previous surgery or treatment with high-energy X-rays (radiation).  · Recent surgery. This may cause the movements of the bowel to slow down and cause food to block the intestine.  · Inflammatory bowel disease, such asCrohn's disease or diverticulitis.  · Growths or tumors.  · A bulging organ (hernia).  · Twisting of the bowel (volvulus).  · A foreign body.  · Slipping of a part of the bowel into another part (intussusception).  What are the signs or symptoms?  Symptoms of this condition include:  · Pain in the abdomen. Depending on the degree of obstruction, pain may be:  ? Mild or severe.  ? Dull cramping or sharp pain.  ? In one area or in the entire abdomen.  · Nausea and vomiting. Vomit may be greenish or a yellow bile color.  · Bloating in the abdomen.  · Difficulty passing stool (constipation).  · Lack of passing gas.  · Frequent belching.  · Diarrhea. This may occur if the obstruction is partial and runny stool is able to leak around the obstruction.  How is this diagnosed?  This condition may be diagnosed based on:  · A  physical exam.  · Medical history.  · Imaging tests of the abdomen or pelvis, such as X-ray or CT scan.  · Blood or urine tests.  How is this treated?  Treatment for this condition depends on the cause and severity of the problem. Treatment may include:  · Fluids and pain medicines that are given through an IV. Your health care provider may instruct you not to eat or drink if you have nausea or vomiting.  · Eating a simple diet. You may be asked to consume a clear liquid diet for several days. This allows the bowel to rest.  · Placement of a small tube (nasogastric tube) into the stomach. This will relieve pain, discomfort, and nausea by removing blocked air and fluids from the stomach. It can also help the obstruction clear up faster.  · Surgery. This may be required if other treatments do not work. Surgery may be required for:  ? Bowel obstruction from a hernia. This can be an emergency procedure.  ? Scar tissue that causes frequent or severe obstructions.  Follow these instructions at home:  Medicines  · Take over-the-counter and prescription medicines only as told by your health care provider.  · If you were prescribed an antibiotic medicine, take it as told by your health care provider. Do not stop taking the antibiotic even if you start to feel better.  General instructions  ·   what activities are safe for you.  Avoid sitting for a long time without moving. Get up to take short walks every 1-2 hours. This is important to improve blood flow and breathing. Ask for help if you feel weak or unsteady.  Keep all follow-up visits as told by your health care provider. This is important. How is this prevented? After having a bowel  obstruction, you are more likely to have another. You may do the following things to prevent another obstruction:  If you have a long-term (chronic) disease, pay attention to your symptoms and contact your health care provider if you have questions or concerns.  Avoid becoming constipated. To prevent or treat constipation, your health care provider may recommend that you: ? Drink enough fluid to keep your urine pale yellow. ? Take over-the-counter or prescription medicines. ? Eat foods that are high in fiber, such as beans, whole grains, and fresh fruits and vegetables. ? Limit foods that are high in fat and processed sugars, such as fried or sweet foods.  Stay active. Exercise for 30 minutes or more, 5 or more days each week. Ask your health care provider which exercises are safe for you.  Avoid stress. Find ways to reduce stress, such as meditation, exercise, or taking time for activities that relax you.  Instead of eating three large meals each day, eat three small meals with three small snacks.  Work with a Microbiologist to make a healthy meal plan that works for you.  Do not use any products that contain nicotine or tobacco, such as cigarettes and e-cigarettes. If you need help quitting, ask your health care provider. Contact a health care provider if you:  Have a fever.  Have chills. Get help right away if you:  Have increased pain or cramping.  Vomit blood.  Have uncontrolled vomiting or nausea.  Cannot drink fluids because of vomiting or pain.  Become confused.  Begin feeling very thirsty (dehydrated).  Have severe bloating.  Feel extremely weak or you faint. Summary  A bowel obstruction is a blockage in the small or large bowel.  A bowel obstruction will prevent food and fluids from passing through the bowel as they normally do during digestion.  Treatment for this condition depends on the cause and severity of the problem. It may include fluids and pain medicines  through an IV, a simple diet, a nasogastric tube, or surgery.  Follow instructions from your health care provider about eating restrictions. You may need to avoid solid foods and consume only clear liquids until your condition improves. This information is not intended to replace advice given to you by your health care provider. Make sure you discuss any questions you have with your health care provider. Document Released: 04/25/2005 Document Revised: 06/20/2017 Document Reviewed: 06/20/2017 Elsevier Interactive Patient Education  2019 [place] (please follow this eating plan for the next 1 month) A soft-food eating plan includes foods that are safe and easy to chew and swallow. Your health care provider or dietitian can help you find foods and flavors that fit into this plan. Follow this plan until your health care provider or dietitian says it is safe to start eating other foods and food textures. What are tips for following this plan? General guidelines   Take small bites of food, or cut food into pieces about  inch or smaller. Bite-sized pieces of food are easier to chew and swallow.  Eat moist foods. Avoid overly dry foods.  Avoid foods that: ? Are difficult to swallow, such as dry, chunky, crispy, or sticky foods. ? Are difficult to chew, such as hard, tough, or stringy foods. ? Contain nuts, seeds, or fruits.  Follow instructions from your dietitian about the types of liquids that are safe for you to swallow. You may be allowed to have: ? Thick liquids only. This includes only liquids that are thicker than honey. ? Thin and thick liquids. This includes all beverages and foods that become liquid at room temperature.  To make thick liquids: ? Purchase a commercial liquid thickening powder. These are available at grocery stores and pharmacies. ? Mix the thickener into liquids according to instructions on the label. ? Purchase ready-made thickened  liquids. ? Thicken soup by pureeing, straining to remove chunks, and adding flour, potato flakes, or corn starch. ? Add commercial thickener to foods that become liquid at room temperature, such as milk shakes, yogurt, ice cream, gelatin, and sherbet.  Ask your health care provider whether you need to take a fiber supplement. Cooking  Cook meats so they stay tender and moist. Use methods like braising, stewing, or baking in liquid.  Cook vegetables and fruit until they are soft enough to be mashed with a fork.  Peel soft, fresh fruits such as peaches, nectarines, and melons.  When making soup, make sure chunks of meat and vegetables are smaller than  inch.  Reheat leftover foods slowly so that a tough crust does not form. What foods are allowed? The items listed below may not be a complete list. Talk with your dietitian about what dietary choices are best for you. Grains Breads, muffins, pancakes, or waffles moistened with syrup, jelly, or butter. Dry cereals well-moistened with milk. Moist, cooked cereals. Well-cooked pasta and rice. Vegetables All soft-cooked vegetables. Shredded lettuce. Fruits All canned and cooked fruits. Soft, peeled fresh fruits. Strawberries. Dairy Milk. Cream. Yogurt. Cottage cheese. Soft cheese without the rind. Meats and other protein foods Tender, moist ground meat, poultry, or fish. Meat cooked in gravy or sauces. Eggs. Sweets and desserts Ice cream. Milk shakes. Sherbet. Pudding. Fats and oils Butter. Margarine. Olive, canola, sunflower, and grapeseed oil. Smooth salad dressing. Smooth cream cheese. Mayonnaise. Gravy. What foods are not allowed? The items listed bemay not be a complete list. Talk with your dietitian about what dietary choices are best for you. Grains Coarse or dry cereals, such as bran, granola, and shredded wheat. Tough or chewy crusty breads, such as [place] bread or baguettes. Breads with nuts, seeds, or fruit. Vegetables All raw  vegetables. Cooked corn. Cooked vegetables that are tough or stringy. Tough, crisp, fried potatoes and potato skins. Fruits Fresh fruits with skins or seeds, or both, such as apples, pears, and grapes. Stringy, high-pulp fruits, such as papaya, pineapple, coconut, and mango. Fruit leather and all dried fruit. Dairy Yogurt with nuts or coconut. Meats and other protein foods Hard, dry sausages. Dry meat, poultry, or fish. Meats with gristle. Fish with bones. Fried meat or fish. Lunch meat and hotdogs. Nuts and seeds. Chunky peanut butter or other nut butters. Sweets and desserts Cakes or cookies that are very dry or chewy. Desserts with dried fruit, nuts, or coconut. Fried pastries. Very rich pastries. Fats and oils Cream cheese with fruit or nuts. Salad dressings with seeds or chunks. Summary  A soft-food eating plan includes foods that are safe and easy to swallow. Generally, the foods should be soft enough to be mashed with a fork.  Avoid foods that  are dry, hard to chew, crunchy, sticky, stringy, or crispy.  Ask your health care provider whether you need to thicken your liquids and if you need to take a fiber supplement. This information is not intended to replace advice given to you by your health care provider. Make sure you discuss any questions you have with your health care provider. Document Released: 05/16/2007 Document Revised: 04/11/2016 Document Reviewed: 04/11/2016 Elsevier Interactive Patient Education  2019 [place].   High-Fiber Diet (please start this diet in 1 month) Fiber, also called dietary fiber, is a type of carbohydrate that is found in fruits, vegetables, whole grains, and beans. A high-fiber diet can have many health benefits. Your health care provider may recommend a high-fiber diet to help:  Prevent constipation. Fiber can make your bowel movements more regular.  Lower your cholesterol.  Relieve the following conditions: ? Swelling of veins in the anus  (hemorrhoids). ? Swelling and irritation (inflammation) of specific areas of the digestive tract (uncomplicated diverticulosis). ? A problem of the large intestine (colon) that sometimes causes pain and diarrhea (irritable bowel syndrome, IBS).  Prevent overeating as part of a weight-loss plan.  Prevent heart disease, type 2 diabetes, and certain cancers. What is my plan? The recommended daily fiber intake in grams (g) includes:  38 g for men age 16 or younger.  30 g for men over age 68.  53 g for women age 48 or younger.  21 g for women over age 69. You can get the recommended daily intake of dietary fiber by:  Eating a variety of fruits, vegetables, grains, and beans.  Taking a fiber supplement, if it is not possible to get enough fiber through your diet. What do I need to know about a high-fiber diet?  It is better to get fiber through food sources rather than from fiber supplements. There is not a lot of research about how effective supplements are.  Always check the fiber content on the nutrition facts label of any prepackaged food. Look for foods that contain 5 g of fiber or more per serving.  Talk with a diet and nutrition specialist (dietitian) if you have questions about specific foods that are recommended or not recommended for your medical condition, especially if those foods are not listed below.  Gradually increase how much fiber you consume. If you increase your intake of dietary fiber too quickly, you may have bloating, cramping, or gas.  Drink plenty of water. Water helps you to digest fiber. What are tips for following this plan?  Eat a wide variety of high-fiber foods.  Make sure that half of the grains that you eat each day are whole grains.  Eat breads and cereals that are made with whole-grain flour instead of refined flour or white flour.  Eat brown rice, bulgur wheat, or millet instead of white rice.  Start the day with a breakfast that is high in  fiber, such as a cereal that contains 5 g of fiber or more per serving.  Use beans in place of meat in soups, salads, and pasta dishes.  Eat high-fiber snacks, such as berries, raw vegetables, nuts, and popcorn.  Choose whole fruits and vegetables instead of processed forms like juice or sauce. What foods can I eat?  Fruits Berries. Pears. Apples. Oranges. Avocado. Prunes and raisins. Dried figs. Vegetables Sweet potatoes. Spinach. Kale. Artichokes. Cabbage. Broccoli. Cauliflower. Green peas. Carrots. Squash. Grains Whole-grain breads. Multigrain cereal. Oats and oatmeal. Brown rice. Barley. Bulgur wheat. [place]. Quinoa.  Bran muffins. Popcorn. Rye wafer crackers. Meats and other proteins [place], kidney, and pinto beans. Soybeans. Split peas. Lentils. Nuts and seeds. Dairy Fiber-fortified yogurt. Beverages Fiber-fortified soy milk. Fiber-fortified orange juice. Other foods Fiber bars. The items listed above may not be a complete list of recommended foods and beverages. Contact a dietitian for more options. What foods are not recommended? Fruits Fruit juice. Cooked, strained fruit. Vegetables Fried potatoes. Canned vegetables. Well-cooked vegetables. Grains White bread. Pasta made with refined flour. White rice. Meats and other proteins Fatty cuts of meat. Fried chicken or fried fish. Dairy Milk. Yogurt. Cream cheese. Sour cream. Fats and oils Butters. Beverages Soft drinks. Other foods Cakes and pastries. The items listed above may not be a complete list of foods and beverages to avoid. Contact a dietitian for more information. Summary  Fiber is a type of carbohydrate. It is found in fruits, vegetables, whole grains, and beans.  There are many health benefits of eating a high-fiber diet, such as preventing constipation, lowering blood cholesterol, helping with weight loss, and reducing your risk of heart disease, diabetes, and certain cancers.  Gradually increase your  intake of fiber. Increasing too fast can result in cramping, bloating, and gas. Drink plenty of water while you increase your fiber.  The best sources of fiber include whole fruits and vegetables, whole grains, nuts, seeds, and beans. This information is not intended to replace advice given to you by your health care provider. Make sure you discuss any questions you have with your health care provider. Document Released: 02/06/2005 Document Revised: 12/11/2016 Document Reviewed: 12/11/2016 Elsevier Interactive Patient Education  2019 [employer].

## 2018-08-05 NOTE — Plan of Care (Signed)

## 2018-08-06 ENCOUNTER — Ambulatory Visit: Payer: Medicare Other | Admitting: Podiatry

## 2018-08-08 ENCOUNTER — Telehealth: Payer: Self-pay | Admitting: Neurology

## 2018-08-08 MED ORDER — LEVETIRACETAM 500 MG PO TABS: 500.0000 mg | ORAL_TABLET | Freq: Two times a day (BID) | ORAL | 0 refills | Status: DC

## 2018-08-08 NOTE — Telephone Encounter (Signed)
Called the patient and discussed that because she had just gotten a 3 month supply insurance is not going to allow for her to complete another script through her insurance. Patient asked is there something else she can use that is similar. Advised with Dr Jannifer Franklin being out Dr Brett Fairy would not feel comfortable ordering something completely different. I reviewed the only other option I could possibly do is send a 15 day supply to the pharmacy and it would have to be purchased out of pocket and she could use good [employer] to assist with covering the cost out of pocket Advised I will ask if Dr Brett Fairy is ok with approving this and If so the cheapest place would be [facility] according to [employer]. Patient states she was fine with that. She states she will continue to look and when her housekeeper comes they will look for it as well. She was very apolagetic

## 2018-08-08 NOTE — Telephone Encounter (Signed)
Pt called stating that she was in the hospital and released Monday and when she got home she can not find her levETIRAcetam (KEPPRA) 500 MG tablet and she just filled it on 07/24/18. She would like to know what can she do because she can not go with out it. Please advise.

## 2018-08-13 DIAGNOSIS — D519 Vitamin B12 deficiency anemia, unspecified: Secondary | ICD-10-CM | POA: Diagnosis not present

## 2018-08-13 DIAGNOSIS — N183 Chronic kidney disease, stage 3 (moderate): Secondary | ICD-10-CM | POA: Diagnosis not present

## 2018-08-13 DIAGNOSIS — E559 Vitamin D deficiency, unspecified: Secondary | ICD-10-CM | POA: Diagnosis not present

## 2018-08-13 DIAGNOSIS — Z136 Encounter for screening for cardiovascular disorders: Secondary | ICD-10-CM | POA: Diagnosis not present

## 2018-08-13 DIAGNOSIS — Z9884 Bariatric surgery status: Secondary | ICD-10-CM | POA: Diagnosis not present

## 2018-08-14 ENCOUNTER — Encounter: Payer: Self-pay | Admitting: Internal Medicine

## 2018-08-16 ENCOUNTER — Ambulatory Visit: Payer: Medicare Other | Admitting: Podiatry

## 2018-08-20 DIAGNOSIS — N183 Chronic kidney disease, stage 3 (moderate): Secondary | ICD-10-CM | POA: Diagnosis not present

## 2018-08-20 DIAGNOSIS — E559 Vitamin D deficiency, unspecified: Secondary | ICD-10-CM | POA: Diagnosis not present

## 2018-08-20 DIAGNOSIS — D519 Vitamin B12 deficiency anemia, unspecified: Secondary | ICD-10-CM | POA: Diagnosis not present

## 2018-08-20 DIAGNOSIS — I10 Essential (primary) hypertension: Secondary | ICD-10-CM | POA: Diagnosis not present

## 2018-08-20 DIAGNOSIS — Z9884 Bariatric surgery status: Secondary | ICD-10-CM | POA: Diagnosis not present

## 2018-08-20 DIAGNOSIS — Z136 Encounter for screening for cardiovascular disorders: Secondary | ICD-10-CM | POA: Diagnosis not present

## 2018-08-20 DIAGNOSIS — R0609 Other forms of dyspnea: Secondary | ICD-10-CM | POA: Diagnosis not present

## 2018-09-09 ENCOUNTER — Other Ambulatory Visit: Payer: Self-pay

## 2018-09-09 ENCOUNTER — Ambulatory Visit (INDEPENDENT_AMBULATORY_CARE_PROVIDER_SITE_OTHER): Payer: Medicare Other | Admitting: Internal Medicine

## 2018-09-09 ENCOUNTER — Encounter: Payer: Self-pay | Admitting: Internal Medicine

## 2018-09-09 ENCOUNTER — Telehealth: Payer: Self-pay | Admitting: Internal Medicine

## 2018-09-09 VITALS — HR 95 | Temp 97.3°F | Ht 66.0 in | Wt 249.0 lb

## 2018-09-09 DIAGNOSIS — I4819 Other persistent atrial fibrillation: Secondary | ICD-10-CM | POA: Diagnosis not present

## 2018-09-09 DIAGNOSIS — I5032 Chronic diastolic (congestive) heart failure: Secondary | ICD-10-CM

## 2018-09-09 DIAGNOSIS — I428 Other cardiomyopathies: Secondary | ICD-10-CM | POA: Diagnosis not present

## 2018-09-09 DIAGNOSIS — I639 Cerebral infarction, unspecified: Secondary | ICD-10-CM

## 2018-09-09 NOTE — Patient Instructions (Signed)
Medication Instructions:  Your physician recommends that you continue on your current medications as directed. Please refer to the Current Medication list given to you today.  If you need a refill on your cardiac medications before your next appointment, please call your pharmacy.   Follow-Up: At [facility], you and your health needs are our priority.  As part of our continuing mission to provide you with exceptional heart care, we have created designated Provider Care Teams.  These Care Teams include your primary Cardiologist (physician) and Advanced Practice Providers (APPs -  Physician Assistants and Nurse Practitioners) who all work together to provide you with the care you need, when you need it. You will need a follow up appointment in 6 months.  Please call our office 2 months in advance to schedule this appointment.  You may see Pixie Casino, MD or one of the following Advanced Practice Providers on your designated Care Team: [place], [place] . Fabian Sharp, PA-C  Any Other Special Instructions Will Be Listed Below (If Applicable).  You have been referred to [facility]

## 2018-09-09 NOTE — Progress Notes (Signed)
OFFICE NOTE  Chief Complaint:  Follow-up  Primary Care Physician: [name], MD  HPI:  Jillian Eaton is a 68 year old female I saw a few weeks ago with a history of super morbid obesity, fibromyalgia and increasing shortness of breath. She had a cath in 2012 which showed normal coronaries, mildly elevating filling pressures, and diastolic pressures with a mean PA of 31. This correlated with her echocardiogram when RV systolic pressures in 6837 were 49 on echo. A repeat echocardiogram was just performed which demonstrated a preserved LVEF of 60-65% with moderate concentric LVH. There was mild to moderate increase in pulmonary artery pressure with peak at 51, which has not significantly changed from her study 1 year ago. Although the pressures look very similar her shortness of breath has been increasing significantly, and I recommended that she wear oxygen at night since she had that at home which does seem to be helping her at least feel better during the day as I suspect she has sleep apnea. She has been tested before which was apparently negative and is considering retesting at some point in the future. Overall I think the main issue obviously is weight, and she unfortunately says that she is not a candidate for gastric bypass and therefore it is a very difficult situation.  I referred her back to her pulmonologist in [facility] for ongoing evaluation of pulmonary hypertension. She tells me that she was then referred to [facility] and saw a specialist there who did another right heart catheterization but did not recommend any medications other than better blood pressure control.   In addition she had problems with kidney stones and underwent 2 operations regarding tthis.  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  She is now dependent on thyroid medication. Unfortunately she's not been able to lose weight, but her weight is fairly stable  around 400 pounds as is her shortness of breath.  Cath in 2012:  LEFT HEART CATHETERIZATION  OPERATOR: Mali Lorenso Quirino, MD, and Rolland Porter, MD  INDICATION: Dyspnea on exertion and chest pressure.  HISTORY OF PRESENT ILLNESS: Jillian Eaton is a morbidly obese (BMI greater than 29) female with a history of failed gastric bypass x2 with an increasing weight gain and increasing shortness of breath as well as new- onset dyspnea on exertion. She reports that she can only walk about 10 feet before she becomes short of breath and has chest pressure which she says get better with rest. She has numerous cardiac risk factors and given the high likelihood of false positive stress test, I have referred her for cardiac catheterization, both left and right heart as she has had elevated RVSP on echocardiogram of approximately 30 to 31 mmHg.  PROCEDURE: The patient was brought into the cardiac catheterization lab, sterilely prepped and draped in the usual fashion. The area around the right femoral artery and right brachial vein were cleansed and draped to allow an attempt at radial arterial and brachiocephalic venous access. IV was not able to be obtained prior to the procedure given her body habitus. With difficulty in assessing the vein, the ultrasound was eventually used to identify the right brachiocephalic vein, however, cannulation with needle and wire was not possible as the vein was very small in caliber. After mild local bleeding was controlled, we did turn our attention to the right femoral vein and with great difficulty using the ultrasound, the right femoral vein was accessed by Dr. Ellyn Hack  using a straight wire and a needle. After venous access was obtained, the attention was turned to the right radial artery by Dr. Ellyn Hack and simultaneous to that right heart catheterization was performed by myself without any difficulty. The right radial artery was  successfully cannulated and subsequently left coronary artery system was selectively injected with a 5-French TIG 4.0 catheter, however the right coronary artery could not be cannulated with the TIG catheter and was eventually cannulated with a JR-4 catheter. LV pressure was measured with a pigtail catheter. Estimated blood loss was about 30 mL. There were no acute complications. The patient received a total of 9 mg of Versed throughout the procedure as well as 225 mcg of fentanyl and was at no point greater than moderately sedated. She received 5000 units of heparin about 10 mL of a radial cocktail.  FINDINGS: 1. Left main - short, no disease. 2. LAD - no significant disease. 3. Left circumflex, no significant disease. 4. RCA - dominant, no disease, large-caliber vessel. 5. LVEDP = 20 mmHg. 6. RA - 12. 7. RV 38/12. 8. PA - 43/19 (31). 9. PCWP - 24. 10.TPG - 7. 11.Fick cardiac output/Fick cardiac index - 10.56/3.84. 12.Thermodilution cardiac output/thermodilution cardiac index -  6.78/2.47. 13.Aortic saturation - 94%. 14.PA saturation - 68%.  IMPRESSION: 1. No significant obstructive coronary artery disease. 2. LVEDP = 20 mmHg. 3. Borderline pulmonary venous hypertension. 4. High cardiac output.  Jillian Eaton returns today for follow-up. She reports that her shortness of breath has not significantly worsened, in fact, possibly is slightly better. She did have thyroid surgery last year at [facility] and apparently underwent right heart catheterization prior to that. I do not have those records immediately available. Unfortunately she continues to maintain her weight and has not been able to lose any. She is complaining of some numbness and tingling in her feet which is likely neuropathy. She is on medication including Lyrica which she takes for fibromyalgia.  Jillian Eaton returns today for follow-up. She recently is been having more shortness of breath  and lower extremity swelling. She pointed out edema in her legs with very small blisters and some chronic venous stasis changes. She is not currently on a diuretic. She recently saw another new primary care provider with the [facility]. She was noted to be started on lisinopril 40 mg daily, which she is taking in addition to losartan 100 mg daily. The notes do not indicate from her office visit why she was started on lisinopril, but I can see through care everywhere that this was ordered by her primary care provider. She also takes amlodipine for blood pressure control. Her blood pressure was elevated initially at 178/94, but after resting came down to 120/78 and is at goal today. It is unusual, however to use both ACE inhibitors and ARBs.  07/02/2015  Mrs. Suderman returns today for follow-up. She underwent a repeat echocardiogram for progressive dyspnea and exertion which showed a preserved LVEF of 6065% however there is moderate to severe pulmonary hypertension with an RVSP of 64 mmHg. This is increased about 10 mmHg compared to her prior study. Her mean pulmonary pressure by catheterization in 2012 was only 31 mmHg. I suspect that progressive pulmonary hypertension as a cause of her worsening shortness of breath. In fact, during recent hospitalization for surgery she required discharged with oxygen. She says she rarely uses oxygen at home but notes that she is hypoxic often when she checks her oxygen levels.  At her last office visit I recommended Lasix which she has been using sparingly. She has problems with incontinence and she does report an improvement in her swelling with it but does not notice significant change in her shortness of breath.  06/06/2016  Mrs. Jergens returns from hospital follow-up. She was just admitted after an admission in January for unintentional narcotic overdose. She had unresponsiveness and possible aspiration. There was a second admission for a similar presentation  with respiratory failure, fever, sepsis and ultimately she was found to have a new onset cardiomyopathy with EF as low as 25%. Was felt that this was nonischemic. She was having intermittent atrial tachycardia and was felt that this might need to be managed with antiarrhythmic therapy however it seems to have resolved with carvedilol. She was placed on diuretics and reports that her breathing is close to baseline. She remains hypoxic with an O2 saturation 93% however was on home oxygen is result of her severe pulmonary hypertension. She does not feel that she needs to use the oxygen very regularly. From a heart failure standpoint she is on carvedilol, aspirin, furosemide and not currently on an ACE inhibitor, ARB or Entresto. Recent testing a renal function shows normal creatinine.  10/11/2016  Mrs. Frenz is seen today in follow-up. In July she was hospitalized for hyperkalemia. She is on supplemental potassium and lisinopril, both were stopped. Fortunately her echo has improved back to normal recently. Overall she feels well. Her weight is down about 40 pounds of the recently she gained about 20 pounds back. She is working on that right as we speak.  04/03/2017  Mrs. Sevigny was seen today in follow-up.  She recently called in and had complaints of worsening shortness of breath.  She saw her pulmonary doctor who did an x-ray noted a mild right upper lobe infiltrate versus edema.  Labs were obtained including BNP which was only mildly elevated at 110.  She was given 20 mg of Lasix with no benefit and then recently was advised over the phone to increase her Lasix to 40 mg for 2 days.  She urinated quite a bit and lost about 5 pounds of what she feels was water weight.  She reports mild improvement in her breathing.  She is quite anxious about what might have led to this fluid gain.  Given her history of cardiomyopathy, it is possible she could have had some recurrent cardiomyopathy.  She reports stable diet and  no worsening abuse of sodium.  06/19/2017  Mrs. Paulino returns today for follow-up.  She was supposed to have a limited echocardiogram but that never happened.  Approximately 2 to 3 weeks ago she called the office reporting palpitations.  We try to schedule an earlier appointment, but she ended up going to [facility] for her [facility].  On the flight she apparently became short of breath had some left flank pain and was hypoxic.  She was given oxygen and after landing they took her to the [facility] in [place].  She was treated there and found to be in A. fib with RVR.  This is a new finding.  She had a remote history of either PAT or PAF, but so brief that she was not anticoagulated.  She was then started on Xarelto.  She tells me she was given Lasix and diuresed.  She returns today for follow-up and still reports some shortness of breath.  Weight is about 6 pounds heavier than she was 2 months ago.  She is in A. fib persistently with heart rates in the low 100s.  In addition she reports after starting the Xarelto that she has been having some vaginal bleeding.  07/26/2017  Mrs. Keasling returns today for follow-up.  She was recently seen in the hospital and discharged on 07/17/2017.  Today is a transition of care follow-up.  She was contacted on the day after discharge and felt to be doing well.  She is currently in rehabilitation.  She says she has had marked improvement in her shortness of breath.  She feels like her edema has improved.  She has decreased her dose of daily Lasix from 80 mg to 40 mg.  She is in persistent atrial fibrillation however rate controlled on amiodarone 400 mg which she is taking twice daily.  She reports she is getting stronger and hopefully will be out of rehabilitation within the next week.  She did miss 1 dose of Eliquis due to a transfer issue on May 28 and therefore will need 3 weeks of subsequent uninterrupted anticoagulation prior to attempted  cardioversion.  09/13/2017  Mrs. Revolorio returns today for follow-up of cardioversion.  Unfortunately this was unsuccessful after 3 shocks.  She told me there is some confusion afterwards that she forgot to take her amiodarone.  She was taking it up until the cardioversion.  Since it was unsuccessful, I recommended rate control strategy.  At this point there is little benefit from being on amiodarone and the side effect profile would not make it favorable.  I would recommend just continuing carvedilol for rate control.  She is had significant weight loss now down to 313 from 340.  This will continue to help her symptoms.  Her shortness of breath has improved.  She has had no significant worsening edema.  No further significant GU bleeding.  09/28/2017  Mrs. Wyne returns today for follow-up of multiple ER visits.  I last saw her a couple weeks ago and unfortunately she was maintaining A. fib after unsuccessful cardioversion.  She continues to lose weight.  In fact she is down to 306 pounds today from 313.  Lab work indicates a rising creatinine and very low BNP, which makes me feel that she may be over diuresed.  She is also noted to be anemic with iron deficiency and is anticoagulated on Eliquis.  Her PCP is likely to start her on iron.  She denies any blood in the stool however stool guaiacs or work-up for microscopic iron is indicated.  She reports intermittent hematuria which is been minor.  Blood pressure is noted to be low today 94/69.  She is on low-dose carvedilol for rate control as well as hydrochlorothiazide and low-dose Lasix.  Recently she said she has had improvement in her breathing and for the first time the other day was able to sleep without oxygen.  10/12/2017  Mrs. Poitras was seen again today in follow-up.  She called the office that she noted to have significant weight gain.  In July she was 313 and had lost weight down to 297.  Subsequently was found out that she was possibly overtreated  with her thyroid medication and her dose was decreased from 300 mcg to 200 mcg daily by her endocrinologist.  Since then she has gained weight but she has been up 12 pounds over the past 2 weeks.  She does report some edema.  I had stopped her hydrochlorothiazide due to hypotension and blood pressure is better today 112/70.  She takes low-dose  Lasix 20 mg daily.  She also reported that she now has stage III chronic kidney disease.  12/13/2017  Mrs. Human returns today for follow-up.  She is done well and felt very stable.  Although her weight is up about 10 pounds she denies any worsening fluid.  She remains on 20 mg Lasix daily.  She is been working with her endocrinologist who reduced her levothyroxine.  Some of the weight gain may be related to that as she was on a very high dose of 300 mcg daily.  She denies any worsening shortness of breath.  She is having problems with her right shoulder and is scheduled to go arthroplasty by Dr. Alma Friendly.  She is here today for preoperative risk assessment.  She denies any chest pain.  She is on Eliquis for A. fib which is persistent but rate control.  She would have to stop this typically about 3 days prior to the procedure.  01/15/2018  JillianStrand is seen today for follow-up of recent surgery.  Actually she did not have surgery rather was induced by anesthesia and then had significant hypotension and instability requiring IV fluids and pressors.  Based on that response anesthesia recommended that the surgery be canceled.  I spoke with Dr. Alma Friendly subsequent to that about the episode and agreed to see her back to help determine why she may have had such a response.  She actually had significant improvement in LV function by echo 6 months ago back to normal.  She has not had recurrent A. fib and has been euvolemic.  She is on a diuretic.  Weight has been going back up however this is been related to changing her thyroid medications as she was iatrogenically hyperthyroid.   Stress testing was not pursued because she has a history of normal coronaries by cath in 2012 and since her EF improved recently with medical therapy alone, this would have been unlikely if she had significant coronary disease.  I suspect that her hypotension was due to being volume depleted and that she had a marked response to the propofol leading to her hypotension.  06/04/2018  Ms. Bohannon was seen today for hospital follow-up as a transition of care 7 visit.  Unfortunately she was admitted with confusion and seizure.  She was started on Keppra.  She did recover from this but was noted to be in A. fib with RVR.  Her carvedilol was switched to Toprol-XL 100 mg daily.  As she had hypotension, her diuretic was discontinued.  Today she reports her weight is even lower.  She denies any new swelling.  She has had some confusion and feels hot and chilled at times.  We had difficulty getting her blood pressure today however I believe it is between 90 and 630 systolic.  This is consistent with her discharge blood pressure readings.  She is concerned she may be on too much metoprolol.  09/09/2018  Ms. Riviere is seen today in follow-up.  Overall she is doing well.  In fact she is lost more weight.  She is now 249 pounds.  She is trying to lose enough weight where she might be a candidate for surgery.  She is not had any further seizure activity.  She does have follow-up with her neurologist tomorrow who may adjust her antiepileptic medications.  I had decreased her beta-blocker because of hypotension.  Blood pressure was actually not able to be obtained today.  She says at home generally runs around 160 systolic.  She  is not had any presyncopal episodes related to this.  Overall she feels well.  She is actually expressed some interest in exertion and perhaps pulmonary rehabilitation for diastolic heart failure.  She generally has NYHA class II-III symptoms.  PMHx:  Past Medical History:  Diagnosis Date   Anemia     Asthma    flare up last yr   CHF (congestive heart failure) (HCC)    Chronic headache    DOE (dyspnea on exertion)    2D ECHO, 02/12/2012 - EF 60-65%, moderate concentric hypertrophy   Fibromyalgia    nerve pain"left side at waist level" "can't lay on that side without pain" , "HOB elevation helps"   Heart murmur    Hematuria - cause not known    History of kidney stones    x 2 '13, '14 surgery to remove   Hypertension    SBO (small bowel obstruction) ([facility]) 06/07/2013   Sleep apnea    tested several times and came up negative   Thyroid disease    "goiter"   Transfusion history    10 yrs+    Past Surgical History:  Procedure Laterality Date   ABDOMINAL ADHESION SURGERY  2004   open LOA - in [place]  04/04/2010   No significant obstructive coronary artery disease   CARDIOVERSION N/A 08/08/2017   Procedure: CARDIOVERSION;  Surgeon: Pixie Casino, MD;  Location: [facility];  Service: Cardiovascular;  Laterality: N/A;   CHOLECYSTECTOMY  1990   COLONOSCOPY W/ POLYPECTOMY     COLONOSCOPY WITH PROPOFOL N/A 04/10/2014   Procedure: COLONOSCOPY WITH PROPOFOL;  Surgeon: Beryle Beams, MD;  Location: [facility];  Service: Endoscopy;  Laterality: N/A;   EYE SURGERY     lasik 20-25 yrs ago   GASTROPLASTY VERTICAL BANDED  [place] for SBO   REVERSE SHOULDER ARTHROPLASTY Right 03/29/2018   Procedure: REVERSE SHOULDER ARTHROPLASTY;  Surgeon: Netta Cedars, MD;  Location: [facility];  Service: Orthopedics;  Laterality: Right;   ROUX-EN-Y GASTRIC BYPASS  1992   Conversion VBG to RnYGB in [place], [place]   SHOULDER ARTHROSCOPY WITH SUBACROMIAL DECOMPRESSION  2016   Dr Jannett Celestine, [place]   SMALL INTESTINE SURGERY  2000   SBO - LOA w SB resection   TOTAL THYROIDECTOMY  06/26/2012   Dr Celine Ahr, [place]  2012   w cystoscopy.  Dr  Gaynelle Arabian    FAMHx:  Family History  Problem Relation Age of Onset   Diabetes Mother    Epilepsy Mother    Cancer Mother        Breast   Hypertension Mother    Breast cancer Mother    Seizures Mother    Kidney disease Father    Diabetes Father    Hypertension Father    Asthma Father    Heart disease Father    Epilepsy Sister    Cancer Maternal Grandmother    Breast cancer Maternal Grandmother    Cancer Paternal Grandmother    Breast cancer Paternal Grandmother     SOCHx:   reports that she has never smoked. She has never used smokeless tobacco. She reports that she does not drink alcohol or use drugs.  ALLERGIES:  Allergies  Allergen Reactions   Hydrocodone Itching    ROS: Pertinent items noted in HPI and remainder of comprehensive ROS otherwise negative.  HOME  MEDS: Current Outpatient Medications  Medication Sig Dispense Refill   acetaminophen (TYLENOL) 650 MG CR tablet Take 1,300 mg by mouth 3 (three) times daily. At 6 am, 2 pm, and 10 pm     apixaban (ELIQUIS) 5 MG TABS tablet Take 1 tablet (5 mg total) by mouth 2 (two) times daily. 60 tablet 0   calcitRIOL (ROCALTROL) 0.5 MCG capsule TAKE 1 CAPSULE BY MOUTH TWICE A DAY (Patient taking differently: Take 0.5 mcg by mouth 2 (two) times a day. ) 180 capsule 0   calcium carbonate (OS-CAL - DOSED IN MG OF ELEMENTAL CALCIUM) 1250 (500 Ca) MG tablet Take 2 tablets (1,000 mg of elemental calcium total) by mouth 2 (two) times daily. (Patient taking differently: Take 2 tablets by mouth 2 (two) times daily with a meal. ) 120 tablet 0   clotrimazole (LOTRIMIN AF) 1 % cream Apply 1 application topically 2 (two) times daily. 30 g 1   cyanocobalamin (,VITAMIN B-12,) 1000 MCG/ML injection Inject 1 mL (1,000 mcg total) into the muscle every 30 (thirty) days. 1 mL 0   cyclobenzaprine (FLEXERIL) 10 MG tablet Take 2 tablets (20 mg total) by mouth 2 (two) times daily. 80 tablet 0   diclofenac sodium (VOLTAREN) 1  % GEL Apply 2 g topically 2 (two) times daily as needed (for knee pain). 100 g 0   DULoxetine (CYMBALTA) 60 MG capsule Take 1 capsule (60 mg total) by mouth daily. 30 capsule 0   eszopiclone (LUNESTA) 2 MG TABS tablet Take 1 tablet (2 mg total) by mouth at bedtime. 20 tablet 0   FERREX 150 150 MG capsule Take 150 mg by mouth 2 (two) times daily.     levETIRAcetam (KEPPRA) 500 MG tablet Take 1 tablet (500 mg total) by mouth 2 (two) times daily. 30 tablet 0   levothyroxine (SYNTHROID) 200 MCG tablet TAKE 1 TABLET BY MOUTH EVERY DAY BEFORE BREAKFAST (Patient taking differently: Take 200 mcg by mouth daily before breakfast. ) 90 tablet 3   magnesium oxide (MAG-OX) 400 MG tablet Take 1 tablet (400 mg total) by mouth daily. 30 tablet 0   metoprolol succinate (TOPROL-XL) 25 MG 24 hr tablet TAKE 1 TABLET BY MOUTH IN AM AND TAKE YOUR 50 MG TABLETS IN THE EVENING TAKE WITH FOOD (Patient taking differently: Take 25 mg by mouth See admin instructions. TAKE 1 TABLET BY MOUTH IN AM AND TAKE YOUR 50 MG TABLETS IN THE EVENING TAKE WITH FOOD) 90 tablet 3   mirabegron ER (MYRBETRIQ) 50 MG TB24 tablet Take 1 tablet (50 mg total) by mouth daily. 30 tablet 0   NONFORMULARY OR COMPOUNDED ITEM [facility]:  Antifungal Cream - Terbinafine 3%, Fluconazole 2%, Tea Tree Oil 5%, Urea 10%, Ibuprofen 2% in DMSO Suspension #38ml. Apply to affected toenail(s) once daily (at bedtime) or twice daily. 30 each 11   senna (SENOKOT) 8.6 MG TABS tablet Take 1 tablet (8.6 mg total) by mouth daily. 120 each 0   traMADol (ULTRAM) 50 MG tablet Take 50 mg by mouth every 6 (six) hours as needed for moderate pain.      traZODone (DESYREL) 50 MG tablet Take 1 tablet (50 mg total) by mouth at bedtime. 30 tablet 0   Vitamin D, Ergocalciferol, (DRISDOL) 1.25 MG (50000 UT) CAPS capsule Take 1 capsule (50,000 Units total) by mouth every Wednesday. 4 capsule 0   furosemide (LASIX) 20 MG tablet Take 1 tablet (20 mg total) by mouth  daily as needed (swelling). 30 tablet 11  No current facility-administered medications for this visit.     LABS/IMAGING: No results found for this or any previous visit (from the past 48 hour(s)). No results found.  VITALS: Pulse 95    Temp (!) 97.3 F (36.3 C)    Ht 5\' 6"  (1.676 m)    Wt 249 lb (112.9 kg)    SpO2 94%    BMI 40.19 kg/m   EXAM: General appearance: alert, morbidly obese and Using a rolling walker Neck: no carotid bruit and no JVD Lungs: diminished breath sounds bilaterally Heart: irregularly irregular rhythm Abdomen: soft, non-tender; bowel sounds normal; no masses,  no organomegaly and morbidly obese Extremities: edema trace bilateral edema Pulses: 2+ and symmetric Skin: Skin color, texture, turgor normal. No rashes or lesions Neurologic: Grossly normal Psych: Pleasant  EKG: Deferred  ASSESSMENT: 1. New seizures 2. Anesthesia-associated hypotension 3. New onset A. fib with RVR-CHADSVASC score of 4 -failed cardioversion on amiodarone 4. Chronic diastolic congestive heart failure-LVEF 25% (improved to 60-65% in 06/2017) 5. Super morbid obesity, with failure of 3 gastric bypass procedures 6. Pulmonary hypertension - PA pressure of 64 mmHg, normal LV systolic function 7. Progressive DOE 8. Hypertension-controlled  9. Possible A. fib/ectopic atrial tachycardia-resolved  PLAN: 1.   Ms. Igoe is a history of systolic and chronic diastolic heart failure however LVEF improved as of May 2019.  She has NYHA class II-III heart failure symptoms would benefit from pulmonary rehabilitation.  I will refer her over to [facility] for this.  She had some new seizure activity is followed by neurology and will see Dr. Jannifer Franklin tomorrow.  She is losing weight which is good and may be a candidate for hip surgery.  Blood pressure continues to be low normal however she has been asymptomatic with it.  No medicine changes were made today.  Follow-up in 6 months.  Pixie Casino, MD, [facility],  [place] Director of the [facility]id [facility] &  Cardiovascular [facility] Clinic Diplomate of the American Board of Clinical Lipidology Attending Cardiologist  Direct Dial: [phone]   Fax: [phone]  Website:  [email or web address] 09/09/2018, 4:57 PM

## 2018-09-09 NOTE — Telephone Encounter (Signed)
I called pt to confirm her appt ob 09-09-18.          COVID-19 Pre-Screening Questions:   In the past 7 to 10 days have you had a cough,  shortness of breath, headache, congestion, fever (100 or greater) body aches, chills, sore throat, or sudden loss of taste or sense of smell? no  Have you been around anyone with known Covid 19.  Have you been around anyone who is awaiting Covid 19 test results in the past 7 to 10 days? no  Have you been around anyone who has been exposed to Covid 19, or has mentioned symptoms of Covid 19 within the past 7 to 10 days? no  If you have any concerns/questions about symptoms patients report during screening (either on the phone or at threshold). Contact the provider seeing the patient or DOD for further guidance.  If neither are available contact a member of the leadership team.

## 2018-09-10 ENCOUNTER — Other Ambulatory Visit: Payer: Self-pay

## 2018-09-10 ENCOUNTER — Ambulatory Visit: Payer: Medicare Other | Admitting: Neurology

## 2018-09-10 ENCOUNTER — Telehealth: Payer: Self-pay | Admitting: Neurology

## 2018-09-10 ENCOUNTER — Ambulatory Visit: Payer: Self-pay | Admitting: Neurology

## 2018-09-10 ENCOUNTER — Telehealth: Payer: Self-pay | Admitting: *Deleted

## 2018-09-10 DIAGNOSIS — R0602 Shortness of breath: Secondary | ICD-10-CM

## 2018-09-10 DIAGNOSIS — I5032 Chronic diastolic (congestive) heart failure: Secondary | ICD-10-CM

## 2018-09-10 NOTE — Telephone Encounter (Signed)
Patient arrived an hour late for her appointment today and per provider needed to reschedule.  I tried to reschedule but could not find anything in the near future and patient would like a call back to see if she can be seen sooner than the next available appointment.  Please call.

## 2018-09-10 NOTE — Telephone Encounter (Signed)
I reached out to the pt.  Per Dr. Jannifer Franklin last note he was ok with the pt following up with the NP. I have scheduled pt to see SS, NP on 10/02/18 at 1115.

## 2018-09-10 NOTE — Telephone Encounter (Signed)
Pixie Casino, MD  Rowe Pavy, RN; Fidel Levy, RN        Thanks Carlette - I thought since her EF had been low in the past she would qualify, but apparently not since it has recovered. We will re-submit for pulmonary rehab. She has a tough time walking (uses a rolling walker) - I thought this might keep her from doing rehab, but she had a friend who had CABG and loves rehab and wants to be more active. She assured me she could do a recumbent bike and has been intentionally losing weight recently.   Dr. Lemmie Evens   Previous Messages  ----- Message -----  From: Rowe Pavy, RN  Sent: 09/09/2018  3:13 PM EDT  To: Pixie Casino, MD   Dr. [name],   Received this referral for pt to participate in Cardiac Rehab. Pt EF is too high for her to particiapte in Cardiac Rehab.  Medicare will only reimburse if the EF is 35% or below. However she could do pulmonary rehab. It is 2 x week and we are hopeful we will be able to begin scheduling our pulmonary pt soon. If you are agreeable to this, please place pulmonary rehab referral with diastolic CHF diagnosis.  Thanks so much Psychologist, clinical, BSN  Cardiac and Training and development officer

## 2018-09-10 NOTE — Telephone Encounter (Signed)
This patient did not show for a revisit appointment today. 

## 2018-09-17 NOTE — Addendum Note (Signed)
Addended by: Fidel Levy on: 09/17/2018 01:25 PM   Modules accepted: Orders

## 2018-09-17 NOTE — Telephone Encounter (Addendum)
Spoke with patient who agrees w/plan for pulmonary rehab.

## 2018-09-18 DIAGNOSIS — N182 Chronic kidney disease, stage 2 (mild): Secondary | ICD-10-CM | POA: Diagnosis not present

## 2018-09-18 DIAGNOSIS — I129 Hypertensive chronic kidney disease with stage 1 through stage 4 chronic kidney disease, or unspecified chronic kidney disease: Secondary | ICD-10-CM | POA: Diagnosis not present

## 2018-09-18 DIAGNOSIS — I509 Heart failure, unspecified: Secondary | ICD-10-CM | POA: Diagnosis not present

## 2018-09-18 DIAGNOSIS — R319 Hematuria, unspecified: Secondary | ICD-10-CM | POA: Diagnosis not present

## 2018-09-18 DIAGNOSIS — D649 Anemia, unspecified: Secondary | ICD-10-CM | POA: Diagnosis not present

## 2018-09-19 ENCOUNTER — Telehealth (HOSPITAL_COMMUNITY): Payer: Self-pay

## 2018-09-19 NOTE — Telephone Encounter (Signed)
Pt insurance is active and benefits verified through Medicare Co-pay 0, DED $198/$198 met, out of pocket 0/0 met, co-insurance 20%. no pre-authorization required. Passport, 09/19/2018 @ 11:30am REF# [id number]  2ndary insurance is active and benefits verified through [employer]. Co-pay 0, DED 0/0 met, out of pocket 0/0 met, co-insurance 0. No pre-authorization required, REF# [id number]

## 2018-09-24 ENCOUNTER — Ambulatory Visit (INDEPENDENT_AMBULATORY_CARE_PROVIDER_SITE_OTHER): Payer: Medicare Other | Admitting: Podiatry

## 2018-09-24 ENCOUNTER — Encounter: Payer: Self-pay | Admitting: Podiatry

## 2018-09-24 ENCOUNTER — Other Ambulatory Visit: Payer: Self-pay

## 2018-09-24 VITALS — Temp 97.8°F

## 2018-09-24 DIAGNOSIS — B351 Tinea unguium: Secondary | ICD-10-CM | POA: Diagnosis not present

## 2018-09-24 DIAGNOSIS — M79675 Pain in left toe(s): Secondary | ICD-10-CM

## 2018-09-24 DIAGNOSIS — Z9229 Personal history of other drug therapy: Secondary | ICD-10-CM | POA: Diagnosis not present

## 2018-09-24 DIAGNOSIS — M79674 Pain in right toe(s): Secondary | ICD-10-CM | POA: Diagnosis not present

## 2018-09-24 DIAGNOSIS — L84 Corns and callosities: Secondary | ICD-10-CM | POA: Diagnosis not present

## 2018-09-24 NOTE — Patient Instructions (Signed)

## 2018-09-25 ENCOUNTER — Telehealth (HOSPITAL_COMMUNITY): Payer: Self-pay

## 2018-09-26 DIAGNOSIS — M25562 Pain in left knee: Secondary | ICD-10-CM | POA: Diagnosis not present

## 2018-09-26 DIAGNOSIS — M25561 Pain in right knee: Secondary | ICD-10-CM | POA: Diagnosis not present

## 2018-09-26 DIAGNOSIS — M25511 Pain in right shoulder: Secondary | ICD-10-CM | POA: Diagnosis not present

## 2018-09-26 NOTE — Progress Notes (Signed)
Subjective:  Jillian Eaton presents to clinic today with cc of  painful, thick, discolored, elongated toenails 1-5 b/l that become tender and cannot cut because of thickness. Pain is aggravated when wearing enclosed shoe gear.  [name], MD is her PCP.    Current Outpatient Medications:  .  acetaminophen (TYLENOL) 650 MG CR tablet, Take 1,300 mg by mouth 3 (three) times daily. At 6 am, 2 pm, and 10 pm, Disp: , Rfl:  .  apixaban (ELIQUIS) 5 MG TABS tablet, Take 1 tablet (5 mg total) by mouth 2 (two) times daily., Disp: 60 tablet, Rfl: 0 .  calcitRIOL (ROCALTROL) 0.5 MCG capsule, TAKE 1 CAPSULE BY MOUTH TWICE A DAY (Patient taking differently: Take 0.5 mcg by mouth 2 (two) times a day. ), Disp: 180 capsule, Rfl: 0 .  calcium carbonate (OS-CAL - DOSED IN MG OF ELEMENTAL CALCIUM) 1250 (500 Ca) MG tablet, Take 2 tablets (1,000 mg of elemental calcium total) by mouth 2 (two) times daily. (Patient taking differently: Take 2 tablets by mouth 2 (two) times daily with a meal. ), Disp: 120 tablet, Rfl: 0 .  clotrimazole (LOTRIMIN AF) 1 % cream, Apply 1 application topically 2 (two) times daily., Disp: 30 g, Rfl: 1 .  cyanocobalamin (,VITAMIN B-12,) 1000 MCG/ML injection, Inject 1 mL (1,000 mcg total) into the muscle every 30 (thirty) days., Disp: 1 mL, Rfl: 0 .  cyclobenzaprine (FLEXERIL) 10 MG tablet, Take 2 tablets (20 mg total) by mouth 2 (two) times daily., Disp: 80 tablet, Rfl: 0 .  diclofenac sodium (VOLTAREN) 1 % GEL, Apply 2 g topically 2 (two) times daily as needed (for knee pain)., Disp: 100 g, Rfl: 0 .  DULoxetine (CYMBALTA) 60 MG capsule, Take 1 capsule (60 mg total) by mouth daily., Disp: 30 capsule, Rfl: 0 .  eszopiclone (LUNESTA) 2 MG TABS tablet, Take 1 tablet (2 mg total) by mouth at bedtime., Disp: 20 tablet, Rfl: 0 .  FERREX 150 150 MG capsule, Take 150 mg by mouth 2 (two) times daily., Disp: , Rfl:  .  levETIRAcetam (KEPPRA) 500 MG tablet, Take 1 tablet (500 mg total)  by mouth 2 (two) times daily., Disp: 30 tablet, Rfl: 0 .  levothyroxine (SYNTHROID) 200 MCG tablet, TAKE 1 TABLET BY MOUTH EVERY DAY BEFORE BREAKFAST (Patient taking differently: Take 200 mcg by mouth daily before breakfast. ), Disp: 90 tablet, Rfl: 3 .  magnesium oxide (MAG-OX) 400 MG tablet, Take 1 tablet (400 mg total) by mouth daily., Disp: 30 tablet, Rfl: 0 .  metoprolol succinate (TOPROL-XL) 25 MG 24 hr tablet, TAKE 1 TABLET BY MOUTH IN AM AND TAKE YOUR 50 MG TABLETS IN THE EVENING TAKE WITH FOOD (Patient taking differently: Take 25 mg by mouth See admin instructions. TAKE 1 TABLET BY MOUTH IN AM AND TAKE YOUR 50 MG TABLETS IN THE EVENING TAKE WITH FOOD), Disp: 90 tablet, Rfl: 3 .  mirabegron ER (MYRBETRIQ) 50 MG TB24 tablet, Take 1 tablet (50 mg total) by mouth daily., Disp: 30 tablet, Rfl: 0 .  NONFORMULARY OR COMPOUNDED ITEM, [facility]:  Antifungal Cream - Terbinafine 3%, Fluconazole 2%, Tea Tree Oil 5%, Urea 10%, Ibuprofen 2% in DMSO Suspension #61ml. Apply to affected toenail(s) once daily (at bedtime) or twice daily., Disp: 30 each, Rfl: 11 .  senna (SENOKOT) 8.6 MG TABS tablet, Take 1 tablet (8.6 mg total) by mouth daily., Disp: 120 each, Rfl: 0 .  traMADol (ULTRAM) 50 MG tablet, Take 50 mg by mouth every  6 (six) hours as needed for moderate pain. , Disp: , Rfl:  .  traZODone (DESYREL) 50 MG tablet, Take 1 tablet (50 mg total) by mouth at bedtime., Disp: 30 tablet, Rfl: 0 .  Vitamin D, Ergocalciferol, (DRISDOL) 1.25 MG (50000 UT) CAPS capsule, Take 1 capsule (50,000 Units total) by mouth every Wednesday., Disp: 4 capsule, Rfl: 0 .  furosemide (LASIX) 20 MG tablet, Take 1 tablet (20 mg total) by mouth daily as needed (swelling)., Disp: 30 tablet, Rfl: 11   Allergies  Allergen Reactions  . Hydrocodone Itching     Objective: Vitals:   09/24/18 0953  Temp: 97.8 F (36.6 C)    Physical Examination:  Vascular Examination: Capillary refill time immediate x 10  digits.  Palpable DP pulses b/l.  PT pulses faintly palpable b/l.  Digital hair absent b/l.  Trace pedal edema noted b/l.  Skin temperature gradient WNL b/l.  Dermatological Examination: Skin with normal turgor, texture and tone b/l.  No open wounds b/l.  No interdigital macerations noted b/l.  Elongated, thick, discolored brittle toenails with subungual debris and pain on dorsal palpation of nailbeds 1-5 b/l.  Hyperkeratotic lesion noted dorsal 5th digit PIPJ b/l with tenderness to palpation. No edema, no erythema, no drainage, no flocculence.  Musculoskeletal Examination: Muscle strength 5/5 to all muscle groups b/l.  Hammertoes 2-5 b/l.  No pain, crepitus or joint discomfort with active/passive ROM.  Neurological Examination: Sensation intact 5/5 b/l with 10 gram monofilament.  Vibratory sensation intact b/l.  Proprioceptive sensation intact b/l.  Assessment: Mycotic nail infection with pain 1-5 b/l Corns b/l 5th digit Pt on long term blood thinner  Plan: 1. Toenails 1-5 b/l were debrided in length and girth without iatrogenic laceration. 2. Corns b/l 5th digits pared utilizing sterile scalpel blade without incident. 3. Continue soft, supportive shoe gear daily. 4. Report any pedal injuries to medical professional. 5.   Follow up 3 months. 6.  Patient/POA to call should there be a question/concern in there interim.

## 2018-09-30 ENCOUNTER — Telehealth (HOSPITAL_COMMUNITY): Payer: Self-pay | Admitting: *Deleted

## 2018-09-30 ENCOUNTER — Ambulatory Visit (HOSPITAL_COMMUNITY): Payer: Medicare Other

## 2018-09-30 NOTE — Telephone Encounter (Signed)
Patient did not show for orientation/walk test for pulmonary rehab today at 0900.  Called patient, she did not answer and was unable to leave a voice mail.  Appointment cancelled.

## 2018-10-01 NOTE — Progress Notes (Addendum)
PATIENT: Jillian Eaton DOB: 07/25/1950  REASON FOR VISIT: follow up HISTORY FROM: patient  HISTORY OF PRESENT ILLNESS: Today 10/02/18  Ms. Lankford is a 68 year old female with history of obesity, fibromyalgia,  atrial fibrillation, and new onset seizure 05/26/2018.  At the time, she had an illness associated with shortness of breath, confusion, and fever.  Her husband witnessed her having a generalized seizure in bed.  She was sent to the hospital and had MRI of the brain that did not show acute ischemia, EEG that showed generalized slowing, no definite epileptiform activity was seen. She remains on Keppra 500 mg twice a day.  She is no longer taking Lyrica for fibromyalgia.  She was in the hospital 07/31/2018 for a small bowel obstruction.  She did not show for her revisit appointment with Dr. Jannifer [place] 09/10/2018.  Since last visit, she has not had recurrent seizure.  She does report that after talking with family, there is question if she may have had a seizure April 23, 2016 when she was very ill, requiring intubation, after an accidental overdose of hydrocodone.  She does report a strong family history of seizure with her mother and sister.  She does mention that at the time of her recent seizure, she had ran out of Lyrica for 1 week.  She presents today for follow-up accompanied by her husband.  HISTORY 06/06/2018 Dr. Jannifer [place]: Jillian Eaton is a 68 year old black female with a history of obesity, fibromyalgia, sleep apnea, and atrial fibrillation.  The patient has a strong family history of seizures, her sister died at age 51 with seizures since age 30 and her mother had seizures at age 61.  The patient was in the emergency room on 25 May 2018 with shortness of breath, confusion, and fever.  The patient was seen and checked for the COVID virus and was negative, she was sent home.  The next morning, her husband noted that she was having a generalized seizure in bed.  The patient has no  recollection of this event.  She was sent to the hospital and was admitted for work-up.  There was some concern about a potential stroke, MRI of the brain was done and did not show acute ischemia.  The patient underwent an EEG study that showed generalized slowing, no definite epileptiform activity was seen.  The patient was placed on Keppra, she currently is on 500 mg twice daily.  She has returned to her usual baseline but still is having some chills and sweats.  She is no longer running fevers, her shortness of breath has improved.  The patient has not had any recurring seizures.  She is sent to this office for further evaluation.  REVIEW OF SYSTEMS: Out of a complete 14 system review of symptoms, the patient complains only of the following symptoms, and all other reviewed systems are negative.  Seizures  ALLERGIES: Allergies  Allergen Reactions  . Hydrocodone Itching    HOME MEDICATIONS: Outpatient Medications Prior to Visit  Medication Sig Dispense Refill  . acetaminophen (TYLENOL) 650 MG CR tablet Take 1,300 mg by mouth 3 (three) times daily. At 6 am, 2 pm, and 10 pm    . apixaban (ELIQUIS) 5 MG TABS tablet Take 1 tablet (5 mg total) by mouth 2 (two) times daily. 60 tablet 0  . calcitRIOL (ROCALTROL) 0.5 MCG capsule TAKE 1 CAPSULE BY MOUTH TWICE A DAY (Patient taking differently: Take 0.5 mcg by mouth 2 (two) times a day. ) 180 capsule 0  .  calcium carbonate (OS-CAL - DOSED IN MG OF ELEMENTAL CALCIUM) 1250 (500 Ca) MG tablet Take 2 tablets (1,000 mg of elemental calcium total) by mouth 2 (two) times daily. (Patient taking differently: Take 2 tablets by mouth 2 (two) times daily with a meal. ) 120 tablet 0  . clotrimazole (LOTRIMIN AF) 1 % cream Apply 1 application topically 2 (two) times daily. 30 g 1  . cyanocobalamin (,VITAMIN B-12,) 1000 MCG/ML injection Inject 1 mL (1,000 mcg total) into the muscle every 30 (thirty) days. 1 mL 0  . cyclobenzaprine (FLEXERIL) 10 MG tablet Take 2 tablets  (20 mg total) by mouth 2 (two) times daily. 80 tablet 0  . diclofenac sodium (VOLTAREN) 1 % GEL Apply 2 g topically 2 (two) times daily as needed (for knee pain). 100 g 0  . DULoxetine (CYMBALTA) 60 MG capsule Take 1 capsule (60 mg total) by mouth daily. 30 capsule 0  . eszopiclone (LUNESTA) 2 MG TABS tablet Take 1 tablet (2 mg total) by mouth at bedtime. 20 tablet 0  . FERREX 150 150 MG capsule Take 150 mg by mouth 2 (two) times daily.    Marland Kitchen levothyroxine (SYNTHROID) 200 MCG tablet TAKE 1 TABLET BY MOUTH EVERY DAY BEFORE BREAKFAST (Patient taking differently: Take 200 mcg by mouth daily before breakfast. ) 90 tablet 3  . magnesium oxide (MAG-OX) 400 MG tablet Take 1 tablet (400 mg total) by mouth daily. 30 tablet 0  . metoprolol succinate (TOPROL-XL) 25 MG 24 hr tablet TAKE 1 TABLET BY MOUTH IN AM AND TAKE YOUR 50 MG TABLETS IN THE EVENING TAKE WITH FOOD (Patient taking differently: Take 25 mg by mouth See admin instructions. TAKE 1 TABLET BY MOUTH IN AM AND TAKE YOUR 25 MG TABLETS IN THE EVENING TAKE WITH FOOD.  Takes 50 total) 90 tablet 3  . mirabegron ER (MYRBETRIQ) 50 MG TB24 tablet Take 1 tablet (50 mg total) by mouth daily. 30 tablet 0  . NONFORMULARY OR COMPOUNDED ITEM [facility]:  Antifungal Cream - Terbinafine 3%, Fluconazole 2%, Tea Tree Oil 5%, Urea 10%, Ibuprofen 2% in DMSO Suspension #11ml. Apply to affected toenail(s) once daily (at bedtime) or twice daily. 30 each 11  . senna (SENOKOT) 8.6 MG TABS tablet Take 1 tablet (8.6 mg total) by mouth daily. 120 each 0  . traMADol (ULTRAM) 50 MG tablet Take 50 mg by mouth every 6 (six) hours as needed for moderate pain.     . traZODone (DESYREL) 50 MG tablet Take 1 tablet (50 mg total) by mouth at bedtime. 30 tablet 0  . Vitamin D, Ergocalciferol, (DRISDOL) 1.25 MG (50000 UT) CAPS capsule Take 1 capsule (50,000 Units total) by mouth every Wednesday. 4 capsule 0  . levETIRAcetam (KEPPRA) 500 MG tablet Take 1 tablet (500 mg total) by mouth  2 (two) times daily. 30 tablet 0  . furosemide (LASIX) 20 MG tablet Take 1 tablet (20 mg total) by mouth daily as needed (swelling). 30 tablet 11   No facility-administered medications prior to visit.     PAST MEDICAL HISTORY: Past Medical History:  Diagnosis Date  . Anemia   . Asthma    flare up last yr  . CHF (congestive heart failure) ([place])   . Chronic headache   . DOE (dyspnea on exertion)    2D ECHO, 02/12/2012 - EF 60-65%, moderate concentric hypertrophy  . Fibromyalgia    nerve pain"left side at waist level" "can't lay on that side without pain" , "HOB elevation helps"  .  Heart murmur   . Hematuria - cause not known   . History of kidney stones    x 2 '13, '14 surgery to remove  . Hypertension   . SBO (small bowel obstruction) ([place]) 06/07/2013  . Sleep apnea    tested several times and came up negative  . Thyroid disease    "goiter"  . Transfusion history    10 yrs+    PAST SURGICAL HISTORY: Past Surgical History:  Procedure Laterality Date  . ABDOMINAL ADHESION SURGERY  2004   open LOA - in [place]  . CARDIAC CATHETERIZATION  04/04/2010   No significant obstructive coronary artery disease  . CARDIOVERSION N/A 08/08/2017   Procedure: CARDIOVERSION;  Surgeon: Pixie Casino, MD;  Location: [facility];  Service: Cardiovascular;  Laterality: N/A;  . CHOLECYSTECTOMY  1990  . COLONOSCOPY W/ POLYPECTOMY    . COLONOSCOPY WITH PROPOFOL N/A 04/10/2014   Procedure: COLONOSCOPY WITH PROPOFOL;  Surgeon: Beryle Beams, MD;  Location: [facility];  Service: Endoscopy;  Laterality: N/A;  . EYE SURGERY     lasik 20-25 yrs ago  . GASTROPLASTY VERTICAL BANDED  1985  . [facility]   In [place] for SBO  . REVERSE SHOULDER ARTHROPLASTY Right 03/29/2018   Procedure: REVERSE SHOULDER ARTHROPLASTY;  Surgeon: Netta Cedars, MD;  Location: [facility];  Service: Orthopedics;  Laterality: Right;  . ROUX-EN-Y GASTRIC BYPASS  1992   Conversion VBG to RnYGB in  [place], [place]  . SHOULDER ARTHROSCOPY WITH SUBACROMIAL DECOMPRESSION  2016   Dr Berenice Primas, [place], [place]  . SMALL INTESTINE SURGERY  2000   SBO - LOA w SB resection  . TOTAL THYROIDECTOMY  06/26/2012   Dr Celine Ahr, [facility]  . TUBAL LIGATION  1986  . URETHRAL DILATION  2012   w cystoscopy.  Dr Gaynelle Arabian    FAMILY HISTORY: Family History  Problem Relation Age of Onset  . Diabetes Mother   . Epilepsy Mother   . Cancer Mother        Breast  . Hypertension Mother   . Breast cancer Mother   . Seizures Mother   . Kidney disease Father   . Diabetes Father   . Hypertension Father   . Asthma Father   . Heart disease Father   . Epilepsy Sister   . Cancer Maternal Grandmother   . Breast cancer Maternal Grandmother   . Cancer Paternal Grandmother   . Breast cancer Paternal Grandmother     SOCIAL HISTORY: Social History   Socioeconomic History  . Marital status: Married    Spouse name: Not on file  . Number of children: 3  . Years of education: Not on file  . Highest education level: Master's degree (e.g., MA, MS, MEng, MEd, MSW, MBA)  Occupational History  . Occupation: Self employed Armed forces operational officer  Social Needs  . Financial resource strain: Not on file  . Food insecurity    Worry: Not on file    Inability: Not on file  . Transportation needs    Medical: Not on file    Non-medical: Not on file  Tobacco Use  . Smoking status: Never Smoker  . Smokeless tobacco: Never Used  Substance and Sexual Activity  . Alcohol use: No    Comment: wine occ  . Drug use: No    Types: Oxycodone    Comment: perscribed  . Sexual activity: Not Currently    Birth control/protection: None  Lifestyle  . Physical activity  Days per week: Not on file    Minutes per session: Not on file  . Stress: Not on file  Relationships  . Social Herbalist on phone: Not on file    Gets together: Not on file    Attends religious service: Not on file    Active member of club or  organization: Not on file    Attends meetings of clubs or organizations: Not on file    Relationship status: Not on file  . Intimate partner violence    Fear of current or ex partner: Not on file    Emotionally abused: Not on file    Physically abused: Not on file    Forced sexual activity: Not on file  Other Topics Concern  . Not on file  Social History Narrative   Right handed   1 cup of caffeine per day         PHYSICAL EXAM  Vitals:   10/02/18 1120  BP: 114/81  Pulse: 79  Temp: (!) 97.5 F (36.4 C)  Weight: 249 lb 9.6 oz (113.2 kg)  Height: 5\' 7"  (1.702 m)   Body mass index is 39.09 kg/m.  Generalized: Well developed, in no acute distress   Neurological examination  Mentation: Alert oriented to time, place, history taking. Follows all commands speech and language fluent Cranial nerve II-XII: Pupils were equal round reactive to light. Extraocular movements were full, visual field were full on confrontational test. Facial sensation and strength were normal.  Head turning and shoulder shrug were normal and symmetric. Motor: The motor testing reveals 5 over 5 strength upper extremities, 4/5 lower extremities. Good symmetric motor tone is noted throughout.  Sensory: Sensory testing is intact to soft touch on all 4 extremities. No evidence of extinction is noted.  Coordination: Cerebellar testing reveals good finger-nose-finger, unable to perform heel-to-shin bilaterally due to body habitus/mobility issues Gait and station: Gait is unsteady, uses walker, has to rise slowly push off from seated position Reflexes: Deep tendon reflexes are symmetric but decreased bilaterally  DIAGNOSTIC DATA (LABS, IMAGING, TESTING) - I reviewed patient records, labs, notes, testing and imaging myself where available.  Lab Results  Component Value Date   WBC 4.2 08/05/2018   HGB 9.0 (L) 08/05/2018   HCT 29.3 (L) 08/05/2018   MCV 95.1 08/05/2018   PLT 164 08/05/2018      Component Value  Date/Time   NA 141 08/04/2018 0446   NA 146 (H) 06/19/2017 1222   K 4.3 08/04/2018 0446   CL 113 (H) 08/04/2018 0446   CO2 23 08/04/2018 0446   GLUCOSE 79 08/04/2018 0446   BUN 10 08/04/2018 0446   BUN 21 06/19/2017 1222   CREATININE 0.64 08/04/2018 0446   CREATININE 1.01 (H) 11/27/2017 1029   CALCIUM 6.5 (L) 08/04/2018 0446   CALCIUM 5.5 (LL) 07/10/2017 2104   PROT 7.4 08/01/2018 0558   PROT 6.8 06/19/2017 1222   ALBUMIN 3.7 08/01/2018 0558   ALBUMIN 3.8 06/19/2017 1222   AST 24 08/01/2018 0558   ALT 16 08/01/2018 0558   ALKPHOS 84 08/01/2018 0558   BILITOT 0.5 08/01/2018 0558   BILITOT 0.4 06/19/2017 1222   GFRNONAA >60 08/04/2018 0446   GFRNONAA 58 (L) 11/27/2017 1029   GFRAA >60 08/04/2018 0446   GFRAA 68 11/27/2017 1029   Lab Results  Component Value Date   CHOL 150 05/27/2018   HDL 62 05/27/2018   LDLCALC 78 05/27/2018   TRIG 50 05/27/2018  CHOLHDL 2.4 05/27/2018   Lab Results  Component Value Date   HGBA1C 4.6 (L) 05/27/2018   Lab Results  Component Value Date   VITAMINB12 233 07/15/2017   Lab Results  Component Value Date   TSH 1.21 11/27/2017    ASSESSMENT AND PLAN 68 y.o. year old female  has a past medical history of Anemia, Asthma, CHF (congestive heart failure) ([place]), Chronic headache, DOE (dyspnea on exertion), Fibromyalgia, Heart murmur, Hematuria - cause not known, History of kidney stones, Hypertension, SBO (small bowel obstruction) ([place]) (06/07/2013), Sleep apnea, Thyroid disease, and Transfusion history. here with:  1.  New onset seizure  She remains on Keppra 500 mg twice daily.  She has not had recurrent seizure since April 2020 around the time of significant illness.  She does report that her family questions if she may have had a seizure also in 2018, when she was very ill after accidental overdose of hydrocodone, requiring intubation.  She has have strong family history of seizure. She did have recent MRI of the brain, but was poor quality  due to movement artifact.  I will repeat MRI of the brain with and without contrast.  I will check a BMP today to determine kidney function.  Prior kidney function has been excellent with a creatinine 0.64, 08/04/2018.  For now, she will remain on Keppra. Dr. Jannifer [place] had mentioned in the future she may be able to come off Keppra. She is currently off Lyrica.  She is currently not driving a car.  She is not until seizure-free for 6 months.  She will follow-up in 6 months or sooner if needed.  I advised that if her symptoms worsen or she develops any new symptoms she should let us know.  Addendum 10/03/2018: The patient has messaged and asked that I update her record to indicate that she does not have sleep apnea. She has had several sleep studies that have been negative.   I spent 15 minutes with the patient. 50% of this time was spent discussing her plan of care.   Butler Denmark, AGNP-C, DNP 10/02/2018, 11:49 AM [facility] [address], [place], [place] [address] [phone]

## 2018-10-02 ENCOUNTER — Other Ambulatory Visit: Payer: Self-pay

## 2018-10-02 ENCOUNTER — Ambulatory Visit (INDEPENDENT_AMBULATORY_CARE_PROVIDER_SITE_OTHER): Payer: Medicare Other | Admitting: Neurology

## 2018-10-02 ENCOUNTER — Encounter: Payer: Self-pay | Admitting: Neurology

## 2018-10-02 ENCOUNTER — Telehealth: Payer: Self-pay | Admitting: Neurology

## 2018-10-02 VITALS — BP 114/81 | HR 79 | Temp 97.5°F | Ht 67.0 in | Wt 249.6 lb

## 2018-10-02 DIAGNOSIS — R569 Unspecified convulsions: Secondary | ICD-10-CM

## 2018-10-02 MED ORDER — LEVETIRACETAM 500 MG PO TABS: 500.0000 mg | ORAL_TABLET | Freq: Two times a day (BID) | ORAL | 1 refills | Status: DC

## 2018-10-02 NOTE — Telephone Encounter (Signed)
Medicare/bcbs fed order sent to GI. No auth they will reach out to the patient to schedule.  °

## 2018-10-02 NOTE — Progress Notes (Signed)
I have read the note, and I agree with the clinical assessment and plan.  Malena Timpone K Sable Knoles   

## 2018-10-02 NOTE — Patient Instructions (Signed)
Please continue Keppra. We will get MRI of the brain. I will check kidney function.

## 2018-10-03 ENCOUNTER — Other Ambulatory Visit: Payer: Self-pay | Admitting: Internal Medicine

## 2018-10-03 ENCOUNTER — Encounter: Payer: Self-pay | Admitting: Neurology

## 2018-10-03 ENCOUNTER — Telehealth: Payer: Self-pay | Admitting: *Deleted

## 2018-10-03 DIAGNOSIS — Z1231 Encounter for screening mammogram for malignant neoplasm of breast: Secondary | ICD-10-CM

## 2018-10-03 LAB — BASIC METABOLIC PANEL
BUN/Creatinine Ratio: 24 (ref 12–28)
BUN: 16 mg/dL (ref 8–27)
CO2: 24 mmol/L (ref 20–29)
Calcium: 7.8 mg/dL — ABNORMAL LOW (ref 8.7–10.3)
Chloride: 104 mmol/L (ref 96–106)
Creatinine, Ser: 0.67 mg/dL (ref 0.57–1.00)
GFR calc Af Amer: 106 mL/min/{1.73_m2} (ref >59)
GFR calc non Af Amer: 92 mL/min/{1.73_m2} (ref >59)
Glucose: 79 mg/dL (ref 65–99)
Potassium: 4.8 mmol/L (ref 3.5–5.2)
Sodium: 144 mmol/L (ref 134–144)

## 2018-10-03 NOTE — Telephone Encounter (Signed)
Spoke with patient and informed her that her labs look good. Patient verbalized understanding, appreciation.

## 2018-10-09 DIAGNOSIS — D485 Neoplasm of uncertain behavior of skin: Secondary | ICD-10-CM | POA: Diagnosis not present

## 2018-10-09 DIAGNOSIS — M25561 Pain in right knee: Secondary | ICD-10-CM | POA: Diagnosis not present

## 2018-10-09 DIAGNOSIS — L814 Other melanin hyperpigmentation: Secondary | ICD-10-CM | POA: Diagnosis not present

## 2018-10-09 DIAGNOSIS — M170 Bilateral primary osteoarthritis of knee: Secondary | ICD-10-CM | POA: Diagnosis not present

## 2018-10-10 ENCOUNTER — Other Ambulatory Visit: Payer: Self-pay | Admitting: Internal Medicine

## 2018-10-11 ENCOUNTER — Telehealth: Payer: Self-pay

## 2018-10-11 DIAGNOSIS — R5383 Other fatigue: Secondary | ICD-10-CM | POA: Diagnosis not present

## 2018-10-11 NOTE — Telephone Encounter (Signed)
   Primary Cardiologist: Pixie Casino, MD  Chart reviewed as part of pre-operative protocol coverage. Patient was contacted 10/11/2018 in reference to pre-operative risk assessment for pending surgery as outlined below.  Jillian Eaton was last seen on 09/09/2018 by Dr. [name].  Since that day, Jillian Eaton has done well from a cardiac standpoint. She is somewhat limited in mobility by knee pain but continues to walk around her apartment complex for exercise daily without complaints of chest pain or SOB. She can easily complete 4 METs without anginal complaints.   Therefore, based on ACC/AHA guidelines, the patient would be at acceptable risk for the planned procedure without further cardiovascular testing.   Per pharmacy recommendations, patient can hold eliquis 3 days prior to her upcoming knee replacement. Eliquis should be restarted as soon as she is cleared to do so by her surgeon.  I will route this recommendation to the requesting party via Epic fax function and remove from pre-op pool.  Please call with questions.  Abigail Butts, PA-C 10/11/2018, 4:06 PM

## 2018-10-11 NOTE — Telephone Encounter (Signed)
Patient with diagnosis of atrial fibrillation on Eliquis for anticoagulation.    Procedure: TKA Date of procedure: 11/14/2018  CHADS2-VASc score of  4 (CHF, HTN, AGE, female)  CrCl 147 Platelet count 164  Per office protocol, patient can hold Eliquis for 3 days prior to procedure.    Patient should restart Eliquis on the evening of procedure or day after, at discretion of procedure MD  For orthopedic procedures please be sure to resume therapeutic (not prophylactic) dosing.

## 2018-10-11 NOTE — Telephone Encounter (Signed)
   [facility] Pre-operative Risk Assessment    Request for surgical clearance:  1. What type of surgery is being performed? TOTAL KNEE ARTHROPLASTY   2. When is this surgery scheduled? 11-14-2018   3. What type of clearance is required (medical clearance vs. Pharmacy clearance to hold med vs. Both)? BOTH  4. Are there any medications that need to be held prior to surgery and how long?  ELIQUIS-3DAYS    5. Practice name and name of physician performing surgery?   [facility]  6. What is your office phone number [phone]    7.   What is your office fax number  [phone]  8.   Anesthesia type (None, local, MAC, general) ?  SPINAL   Waylan Rocher 10/11/2018, 2:33 PM  _________________________________________________________________   (provider comments below)

## 2018-10-16 DIAGNOSIS — R5383 Other fatigue: Secondary | ICD-10-CM | POA: Diagnosis not present

## 2018-10-16 DIAGNOSIS — M25562 Pain in left knee: Secondary | ICD-10-CM | POA: Diagnosis not present

## 2018-10-16 DIAGNOSIS — M1712 Unilateral primary osteoarthritis, left knee: Secondary | ICD-10-CM | POA: Diagnosis not present

## 2018-10-17 DIAGNOSIS — Z136 Encounter for screening for cardiovascular disorders: Secondary | ICD-10-CM | POA: Diagnosis not present

## 2018-10-17 DIAGNOSIS — E559 Vitamin D deficiency, unspecified: Secondary | ICD-10-CM | POA: Diagnosis not present

## 2018-10-17 DIAGNOSIS — Z9884 Bariatric surgery status: Secondary | ICD-10-CM | POA: Diagnosis not present

## 2018-10-18 DIAGNOSIS — D509 Iron deficiency anemia, unspecified: Secondary | ICD-10-CM | POA: Diagnosis not present

## 2018-10-18 DIAGNOSIS — Z01818 Encounter for other preprocedural examination: Secondary | ICD-10-CM | POA: Diagnosis not present

## 2018-10-21 ENCOUNTER — Ambulatory Visit (HOSPITAL_COMMUNITY): Payer: Medicare Other

## 2018-10-22 DIAGNOSIS — Z13820 Encounter for screening for osteoporosis: Secondary | ICD-10-CM | POA: Diagnosis not present

## 2018-10-22 DIAGNOSIS — Z01419 Encounter for gynecological examination (general) (routine) without abnormal findings: Secondary | ICD-10-CM | POA: Diagnosis not present

## 2018-10-22 DIAGNOSIS — Z0000 Encounter for general adult medical examination without abnormal findings: Secondary | ICD-10-CM | POA: Diagnosis not present

## 2018-10-22 DIAGNOSIS — Z1231 Encounter for screening mammogram for malignant neoplasm of breast: Secondary | ICD-10-CM | POA: Diagnosis not present

## 2018-10-22 DIAGNOSIS — D519 Vitamin B12 deficiency anemia, unspecified: Secondary | ICD-10-CM | POA: Diagnosis not present

## 2018-10-22 DIAGNOSIS — Z23 Encounter for immunization: Secondary | ICD-10-CM | POA: Diagnosis not present

## 2018-10-22 DIAGNOSIS — Z1159 Encounter for screening for other viral diseases: Secondary | ICD-10-CM | POA: Diagnosis not present

## 2018-10-22 DIAGNOSIS — Z136 Encounter for screening for cardiovascular disorders: Secondary | ICD-10-CM | POA: Diagnosis not present

## 2018-10-22 DIAGNOSIS — Z1211 Encounter for screening for malignant neoplasm of colon: Secondary | ICD-10-CM | POA: Diagnosis not present

## 2018-10-24 ENCOUNTER — Other Ambulatory Visit: Payer: Self-pay

## 2018-10-24 ENCOUNTER — Ambulatory Visit (HOSPITAL_COMMUNITY)
Admission: RE | Admit: 2018-10-24 | Discharge: 2018-10-24 | Disposition: A | Discharge status: Home / Self Care | Payer: Medicare Other | Source: Ambulatory Visit | Attending: Internal Medicine | Admitting: Internal Medicine

## 2018-10-24 DIAGNOSIS — D509 Iron deficiency anemia, unspecified: Secondary | ICD-10-CM | POA: Diagnosis not present

## 2018-10-24 MED ORDER — SODIUM CHLORIDE 0.9 % IV SOLN
200.0000 mg | Freq: Once | INTRAVENOUS | Status: AC
  Administered 2018-10-24: 200 mg via INTRAVENOUS
  Filled 2018-10-24: qty 10

## 2018-10-24 MED ORDER — SODIUM CHLORIDE 0.9 % IV SOLN
INTRAVENOUS | Status: DC | PRN
  Administered 2018-10-24: 250 mL via INTRAVENOUS

## 2018-10-24 NOTE — Progress Notes (Signed)
PATIENT CARE CENTER NOTE  Diagnosis: Iron Deficiency Anemia   Provider: Latanya Presser, MD   Procedure: Venofer Infusion   Note: Patient received Venofer infusion via PIV. Tolerated well with no adverse reaction. Vital signs stable. Discharge instructions given. Patient alert, oriented and ambulatory with walker at discharge.

## 2018-10-24 NOTE — Discharge Instructions (Signed)

## 2018-10-25 DIAGNOSIS — H2513 Age-related nuclear cataract, bilateral: Secondary | ICD-10-CM | POA: Diagnosis not present

## 2018-10-25 DIAGNOSIS — H35033 Hypertensive retinopathy, bilateral: Secondary | ICD-10-CM | POA: Diagnosis not present

## 2018-10-25 DIAGNOSIS — H43812 Vitreous degeneration, left eye: Secondary | ICD-10-CM | POA: Diagnosis not present

## 2018-10-25 DIAGNOSIS — H04123 Dry eye syndrome of bilateral lacrimal glands: Secondary | ICD-10-CM | POA: Diagnosis not present

## 2018-11-04 ENCOUNTER — Ambulatory Visit
Admission: RE | Admit: 2018-11-04 | Discharge: 2018-11-04 | Disposition: A | Discharge status: Home / Self Care | Payer: Medicare Other | Source: Ambulatory Visit | Attending: Neurology | Admitting: Neurology

## 2018-11-04 ENCOUNTER — Other Ambulatory Visit: Payer: Self-pay

## 2018-11-04 DIAGNOSIS — R569 Unspecified convulsions: Secondary | ICD-10-CM | POA: Diagnosis not present

## 2018-11-04 MED ORDER — GADOBENATE DIMEGLUMINE 529 MG/ML IV SOLN
20.0000 mL | Freq: Once | INTRAVENOUS | Status: AC | PRN
  Administered 2018-11-04: 20 mL via INTRAVENOUS

## 2018-11-05 ENCOUNTER — Telehealth: Payer: Self-pay | Admitting: Neurology

## 2018-11-06 NOTE — H&P (Signed)
TOTAL KNEE ADMISSION H&P  Patient is being admitted for left total knee arthroplasty.  Subjective:  Chief Complaint:  Left knee primary OA / pain  HPI: Jillian Eaton, 68 y.o. female, has a history of pain and functional disability in the left knee due to arthritis and has failed non-surgical conservative treatments for greater than 12 weeks to include NSAID's and/or analgesics, corticosteriod injections, viscosupplementation injections, use of assistive devices and activity modification.  Onset of symptoms was gradual, starting 20+ years ago with gradually worsening course since that time. The patient noted prior procedures on the knee to include  arthroscopy on the left knee(s).  Patient currently rates pain in the left knee(s) at 10 out of 10 with activity. Patient has night pain, worsening of pain with activity and weight bearing, pain that interferes with activities of daily living, pain with passive range of motion, crepitus and joint swelling.  Patient has evidence of periarticular osteophytes and joint space narrowing by imaging studies.  There is no active infection.  Risks, benefits and expectations were discussed with the patient.  Risks including but not limited to the risk of anesthesia, blood clots, nerve damage, blood vessel damage, failure of the prosthesis, infection and up to and including death.  Patient understand the risks, benefits and expectations and wishes to proceed with surgery.   PCP: [name], MD  D/C Plans:       Home   Post-op Meds:       No Rx given   Tranexamic Acid:      To be given - IV   Decadron:      Is to be given  FYI:     Eliquis (on pre-op)  Norco (needs Benadryl)  DME:   Pt equipment arranged  PT:   OPPT arranged  Pharmacy: [facility] [place]    Patient Active Problem List   Diagnosis Date Noted  . Degenerative joint disease involving multiple joints on both sides of body 07/31/2018  . Rotator cuff tear arthropathy of  right shoulder 04/05/2018  . Overactive bladder 04/05/2018  . Steroid-induced hyperglycemia   . Generalized OA   . 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 NICM (nonischemic cardiomyopathy) ([place]) 01/15/2018  . Osteoporosis 12/13/2017  . Rotator cuff tear arthropathy 11/29/2017  . Chronic diastolic CHF (congestive heart failure) ([place]) 09/28/2017  . CKD (chronic kidney disease) stage 3, GFR 30-59 ml/min (HCC) 09/07/2017  . Hypomagnesemia with secondary hypocalcemia 09/07/2017  . 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 Hypercapnemia 07/13/2017  . AKI (acute kidney injury) ([place]) 07/10/2017  . Vertigo 07/08/2017  . Blurring of visual image 07/08/2017  . On anticoagulant therapy 06/19/2017  . Chronic respiratory failure with hypoxia ([place]) 04/19/2017  . GERD (gastroesophageal reflux disease) 08/28/2016  . 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 Depressive disorder 08/28/2016  . Iatrogenic hypocalcemia 08/28/2016  . Chronic pain syndrome   . Acute diastolic (congestive) heart failure ([place])   . Oropharyngeal dysphagia   . Chronic atrial fibrillation 04/28/2016  . Vocal cord dysfunction 04/28/2016  . Thrombocytopenia ([place]) 04/28/2016  . Seizures ([place])   . Preoperative cardiovascular examination   . 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  . Leg swelling 10/19/2015  . Acute on chronic respiratory failure with hypoxia and hypercapnia ([place]) 08/03/2015  . Bilateral leg edema 05/11/2015  . Essential hypertension 05/11/2015  . Nephrolithiasis 04/12/2015  . Numbness in both hands 04/12/2015  . Complete tear of left rotator cuff 11/19/2014  . Primary osteoarthritis of both  knees 03/24/2014  . Chronic asthmatic bronchitis ([place]) 07/30/2013  . Bariatric surgery status 07/30/2013  . 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 Urge incontinence of urine 07/30/2013  . Vitamin D deficiency 07/30/2013  . B-complex deficiency 07/30/2013  . Cough 07/30/2013  . Gross  hematuria 07/30/2013  . Goiter 07/30/2013  . Palpitations 07/30/2013  . Right flank pain 07/30/2013  . SBO (small bowel obstruction) ([place]) 06/07/2013  . Pulmonary hypertension ([place]) 01/08/2013  . Insomnia 03/22/2012  . Mild intermittent asthma with acute exacerbation 03/22/2012  . Fibromyalgia 10/26/2011  . Anemia of chronic disease 03/28/2011  . Morbid obesity with body mass index of 40.0-49.9 ([place]) 09/22/2010   Past Medical History:  Diagnosis Date  . Anemia   . Asthma    flare up last yr  . CHF (congestive heart failure) ([place])   . Chronic headache   . DOE (dyspnea on exertion)    2D ECHO, 02/12/2012 - EF 60-65%, moderate concentric hypertrophy  . Fibromyalgia    nerve pain"left side at waist level" "can't lay on that side without pain" , "HOB elevation helps"  . Heart murmur   . Hematuria - cause not known   . History of kidney stones    x 2 '13, '14 surgery to remove  . Hypertension   . SBO (small bowel obstruction) ([place]) 06/07/2013  . Sleep apnea    tested several times and came up negative  . Thyroid disease    "goiter"  . Transfusion history    10 yrs+    Past Surgical History:  Procedure Laterality Date  . ABDOMINAL ADHESION SURGERY  2004   open LOA - in [place]  . CARDIAC CATHETERIZATION  04/04/2010   No significant obstructive coronary artery disease  . CARDIOVERSION N/A 08/08/2017   Procedure: CARDIOVERSION;  Surgeon: Pixie Casino, MD;  Location: [facility];  Service: Cardiovascular;  Laterality: N/A;  . CHOLECYSTECTOMY  1990  . COLONOSCOPY W/ POLYPECTOMY    . COLONOSCOPY WITH PROPOFOL N/A 04/10/2014   Procedure: COLONOSCOPY WITH PROPOFOL;  Surgeon: Beryle Beams, MD;  Location: [facility];  Service: Endoscopy;  Laterality: N/A;  . EYE SURGERY     lasik 20-25 yrs ago  . GASTROPLASTY VERTICAL BANDED  1985  . [place]   In [place] for SBO  . REVERSE SHOULDER ARTHROPLASTY Right 03/29/2018   Procedure: REVERSE SHOULDER  ARTHROPLASTY;  Surgeon: Netta Cedars, MD;  Location: [facility];  Service: Orthopedics;  Laterality: Right;  . ROUX-EN-Y GASTRIC BYPASS  1992   Conversion VBG to RnYGB in [place], [place]  . SHOULDER ARTHROSCOPY WITH SUBACROMIAL DECOMPRESSION  2016   Dr Berenice Primas, [place], [place]  . SMALL INTESTINE SURGERY  2000   SBO - LOA w SB resection  . TOTAL THYROIDECTOMY  06/26/2012   Dr Celine Ahr, [facility]  . TUBAL LIGATION  1986  . URETHRAL DILATION  2012   w cystoscopy.  Dr Gaynelle Arabian    No current facility-administered medications for this encounter.    Current Outpatient Medications  Medication Sig Dispense Refill Last Dose  . acetaminophen (TYLENOL) 650 MG CR tablet Take 1,300 mg by mouth 3 (three) times daily. At 6 am, 2 pm, and 10 pm     . apixaban (ELIQUIS) 5 MG TABS tablet Take 1 tablet (5 mg total) by mouth 2 (two) times daily. 60 tablet 0   . calcitRIOL (ROCALTROL) 0.5 MCG capsule TAKE 1 CAPSULE BY MOUTH TWICE A DAY 180 capsule 0   .  calcium carbonate (OS-CAL - DOSED IN MG OF ELEMENTAL CALCIUM) 1250 (500 Ca) MG tablet Take 2 tablets (1,000 mg of elemental calcium total) by mouth 2 (two) times daily. (Patient taking differently: Take 2 tablets by mouth 2 (two) times daily with a meal. ) 120 tablet 0   . clotrimazole (LOTRIMIN AF) 1 % cream Apply 1 application topically 2 (two) times daily. 30 g 1   . cyanocobalamin (,VITAMIN B-12,) 1000 MCG/ML injection Inject 1 mL (1,000 mcg total) into the muscle every 30 (thirty) days. 1 mL 0   . cyclobenzaprine (FLEXERIL) 10 MG tablet Take 2 tablets (20 mg total) by mouth 2 (two) times daily. 80 tablet 0   . diclofenac sodium (VOLTAREN) 1 % GEL Apply 2 g topically 2 (two) times daily as needed (for knee pain). 100 g 0   . DULoxetine (CYMBALTA) 60 MG capsule Take 1 capsule (60 mg total) by mouth daily. 30 capsule 0   . eszopiclone (LUNESTA) 2 MG TABS tablet Take 1 tablet (2 mg total) by mouth at bedtime. 20 tablet 0   . FERREX 150 150 MG capsule Take 150  mg by mouth 2 (two) times daily.     . furosemide (LASIX) 20 MG tablet Take 1 tablet (20 mg total) by mouth daily as needed (swelling). 30 tablet 11   . levETIRAcetam (KEPPRA) 500 MG tablet Take 1 tablet (500 mg total) by mouth 2 (two) times daily. 180 tablet 1   . levothyroxine (SYNTHROID) 200 MCG tablet TAKE 1 TABLET BY MOUTH EVERY DAY BEFORE BREAKFAST (Patient taking differently: Take 200 mcg by mouth daily before breakfast. ) 90 tablet 3   . magnesium oxide (MAG-OX) 400 MG tablet Take 1 tablet (400 mg total) by mouth daily. 30 tablet 0   . metoprolol succinate (TOPROL-XL) 25 MG 24 hr tablet TAKE 1 TABLET BY MOUTH IN AM AND TAKE YOUR 50 MG TABLETS IN THE EVENING TAKE WITH FOOD (Patient taking differently: Take 25 mg by mouth See admin instructions. TAKE 1 TABLET BY MOUTH IN AM AND TAKE YOUR 25 MG TABLETS IN THE EVENING TAKE WITH FOOD.  Takes 50 total) 90 tablet 3   . mirabegron ER (MYRBETRIQ) 50 MG TB24 tablet Take 1 tablet (50 mg total) by mouth daily. 30 tablet 0   . NONFORMULARY OR COMPOUNDED ITEM [facility]:  Antifungal Cream - Terbinafine 3%, Fluconazole 2%, Tea Tree Oil 5%, Urea 10%, Ibuprofen 2% in DMSO Suspension #20ml. Apply to affected toenail(s) once daily (at bedtime) or twice daily. 30 each 11   . senna (SENOKOT) 8.6 MG TABS tablet Take 1 tablet (8.6 mg total) by mouth daily. 120 each 0   . traMADol (ULTRAM) 50 MG tablet Take 50 mg by mouth every 6 (six) hours as needed for moderate pain.      . traZODone (DESYREL) 50 MG tablet Take 1 tablet (50 mg total) by mouth at bedtime. 30 tablet 0   . Vitamin D, Ergocalciferol, (DRISDOL) 1.25 MG (50000 UT) CAPS capsule Take 1 capsule (50,000 Units total) by mouth every Wednesday. 4 capsule 0    Allergies  Allergen Reactions  . Hydrocodone Itching    Social History   Tobacco Use  . Smoking status: Never Smoker  . Smokeless tobacco: Never Used  Substance Use Topics  . Alcohol use: No    Comment: wine occ    Family History   Problem Relation Age of Onset  . Diabetes Mother   . Epilepsy Mother   . Cancer  Mother        Breast  . Hypertension Mother   . Breast cancer Mother   . Seizures Mother   . Kidney disease Father   . Diabetes Father   . Hypertension Father   . Asthma Father   . Heart disease Father   . Epilepsy Sister   . Cancer Maternal Grandmother   . Breast cancer Maternal Grandmother   . Cancer Paternal Grandmother   . Breast cancer Paternal Grandmother      Review of Systems  Constitutional: Negative.   HENT: Negative.   Eyes: Negative.   Respiratory: Negative.   Cardiovascular: Negative.   Gastrointestinal: Positive for heartburn.  Genitourinary: Negative.   Musculoskeletal: Positive for joint pain.  Skin: Negative.   Neurological: Negative.   Endo/Heme/Allergies: Negative.   Psychiatric/Behavioral: Positive for depression.    Objective:  Physical Exam  Constitutional: She is oriented to person, place, and time. She appears well-developed.  HENT:  Head: Normocephalic.  Eyes: Pupils are equal, round, and reactive to light.  Neck: Neck supple. No JVD present. No tracheal deviation present. No thyromegaly present.  Cardiovascular: Normal rate, regular rhythm and intact distal pulses.  Respiratory: Effort normal and breath sounds normal. No respiratory distress. She has no wheezes.  GI: Soft. There is no abdominal tenderness. There is no guarding.  Musculoskeletal:     Left knee: She exhibits swelling and bony tenderness. She exhibits no ecchymosis, no deformity, no laceration and no erythema. Tenderness found.  Lymphadenopathy:    She has no cervical adenopathy.  Neurological: She is alert and oriented to person, place, and time.  Skin: Skin is warm and dry.  Psychiatric: She has a normal mood and affect.     Labs:  Estimated body mass index is 39.09 kg/m as calculated from the following:   Height as of 10/02/18: 5\' 7"  (1.702 m).   Weight as of 10/02/18: 113.2  kg.   Imaging Review Plain radiographs demonstrate severe degenerative joint disease of the left knee(s).  The bone quality appears to be good for age and reported activity level.      Assessment/Plan:  End stage arthritis, left knee   The patient history, physical examination, clinical judgment of the provider and imaging studies are consistent with end stage degenerative joint disease of the left knee(s) and total knee arthroplasty is deemed medically necessary. The treatment options including medical management, injection therapy arthroscopy and arthroplasty were discussed at length. The risks and benefits of total knee arthroplasty were presented and reviewed. The risks due to aseptic loosening, infection, stiffness, patella tracking problems, thromboembolic complications and other imponderables were discussed. The patient acknowledged the explanation, agreed to proceed with the plan and consent was signed. Patient is being admitted for inpatient treatment for surgery, pain control, PT, OT, prophylactic antibiotics, VTE prophylaxis, progressive ambulation and ADL's and discharge planning. The patient is planning to be discharged home.    Patient's anticipated LOS is less than 2 midnights, meeting these requirements: - Lives within 1 hour of care - Has a competent adult at home to recover with post-op recover - NO history of  - Chronic pain requiring opiods  - Diabetes  - Coronary Artery Disease  - Heart failure  - Heart attack  - Stroke  - DVT/VTE  - Cardiac arrhythmia  - Respiratory Failure/COPD  - Anemia  - Advanced Liver disease    [place] Pugh. Quandarius Nill   PA-C  11/06/2018, 10:45 AM

## 2018-11-06 NOTE — Patient Instructions (Addendum)
DUE TO COVID-19 ONLY ONE VISITOR IS ALLOWED TO COME WITH YOU AND STAY IN THE WAITING ROOM ONLY DURING PRE OP AND PROCEDURE DAY OF SURGERY. THE 1 VISITOR MAY VISIT WITH YOU AFTER SURGERY IN YOUR PRIVATE ROOM DURING VISITING HOURS ONLY!  YOU NEED TO HAVE A COVID 19 TEST ON___9-21-2020____ @___10 :00AM____, THIS TEST MUST BE DONE BEFORE SURGERY, COME  [address] , [address].  ([place]) ONCE YOUR COVID TEST IS COMPLETED, PLEASE BEGIN THE QUARANTINE INSTRUCTIONS AS OUTLINED IN YOUR HANDOUT.                [facility]    Your procedure is scheduled on: 11-14-2018   Report to [facility] Main  Entrance   Report to admitting at 7:00AM     Call this number if you have problems the morning of surgery [place], NO CHEWING GUM [place].   Do not eat food After Midnight. YOU MAY HAVE CLEAR LIQUIDS FROM MIDNIGHT UNTIL 6:30AM. At 6:30AM Please finish the prescribed Pre-Surgery ENSURE drink. Nothing by mouth after you finish the ENSURE drink !   CLEAR LIQUID DIET   Foods Allowed                                                                     Foods Excluded  Coffee and tea, regular and decaf                             liquids that you cannot  Plain Jell-O any favor except red or purple                                           see through such as: Fruit ices (not with fruit pulp)                                     milk, soups, orange juice  Iced Popsicles                                    All solid food Carbonated beverages, regular and diet                                    Cranberry, grape and apple juices Sports drinks like Gatorade Lightly seasoned clear broth or consume(fat free) Sugar, honey syrup  Sample Menu Breakfast                                Lunch  Supper Cranberry juice                    Beef broth                             Chicken broth Jell-O                                     Grape juice                           Apple juice Coffee or tea                        Jell-O                                      Popsicle                                                Coffee or tea                        Coffee or tea  _____________________________________________________________________     Take these medicines the morning of surgery with A SIP OF WATER: DULOXETINE, KEPPRA, SYNTHROID                                 You may not have any metal on your body including hair pins and              piercings  Do not wear jewelry, make-up, lotions, powders or perfumes, deodorant             Do not wear nail polish.  Do not shave  48 hours prior to surgery.          Do not bring valuables to the hospital. [place].  Contacts, dentures or bridgework may not be worn into surgery.  YOU MAY BRING A SMALL OVERNIGHT BAG               Please read over the following fact sheets you were given: _____________________________________________________________________             [facility] - Preparing for Surgery Before surgery, you can play an important role.  Because skin is not sterile, your skin needs to be as free of germs as possible.  You can reduce the number of germs on your skin by washing with CHG (chlorahexidine gluconate) soap before surgery.  CHG is an antiseptic cleaner which kills germs and bonds with the skin to continue killing germs even after washing. Please DO NOT use if you have an allergy to CHG or antibacterial soaps.  If your skin becomes reddened/irritated stop using the CHG and inform your nurse when you arrive at Short Stay. Do not shave (including legs and underarms) for at least 48 hours prior to the first CHG shower.  You may  shave your face/neck. Please follow these instructions carefully:  1.  Shower with CHG Soap the night  before surgery and the  morning of Surgery.  2.  If you choose to wash your hair, wash your hair first as usual with your  normal  shampoo.  3.  After you shampoo, rinse your hair and body thoroughly to remove the  shampoo.                           4.  Use CHG as you would any other liquid soap.  You can apply chg directly  to the skin and wash                       Gently with a scrungie or clean washcloth.  5.  Apply the CHG Soap to your body ONLY FROM THE NECK DOWN.   Do not use on face/ open                           Wound or open sores. Avoid contact with eyes, ears mouth and genitals (private parts).                       Wash face,  Genitals (private parts) with your normal soap.             6.  Wash thoroughly, paying special attention to the area where your surgery  will be performed.  7.  Thoroughly rinse your body with warm water from the neck down.  8.  DO NOT shower/wash with your normal soap after using and rinsing off  the CHG Soap.                9.  Pat yourself dry with a clean towel.            10.  Wear clean pajamas.            11.  Place clean sheets on your bed the night of your first shower and do not  sleep with pets. Day of Surgery : Do not apply any lotions/deodorants the morning of surgery.  Please wear clean clothes to the hospital/surgery center.  FAILURE TO FOLLOW THESE INSTRUCTIONS MAY RESULT IN THE CANCELLATION OF YOUR SURGERY PATIENT SIGNATURE_________________________________  NURSE SIGNATURE__________________________________  ________________________________________________________________________   Adam Phenix  An incentive spirometer is a tool that can help keep your lungs clear and active. This tool measures how well you are filling your lungs with each breath. Taking long deep breaths may help reverse or decrease the chance of developing breathing (pulmonary) problems (especially infection) following:  A long period of time when you are  unable to move or be active. BEFORE THE PROCEDURE   If the spirometer includes an indicator to show your best effort, your nurse or respiratory therapist will set it to a desired goal.  If possible, sit up straight or lean slightly forward. Try not to slouch.  Hold the incentive spirometer in an upright position. INSTRUCTIONS FOR USE  1. Sit on the edge of your bed if possible, or sit up as far as you can in bed or on a chair. 2. Hold the incentive spirometer in an upright position. 3. Breathe out normally. 4. Place the mouthpiece in your mouth and seal your lips tightly around it. 5. Breathe in slowly and as deeply as possible, raising the  piston or the ball toward the top of the column. 6. Hold your breath for 3-5 seconds or for as long as possible. Allow the piston or ball to fall to the bottom of the column. 7. Remove the mouthpiece from your mouth and breathe out normally. 8. Rest for a few seconds and repeat Steps 1 through 7 at least 10 times every 1-2 hours when you are awake. Take your time and take a few normal breaths between deep breaths. 9. The spirometer may include an indicator to show your best effort. Use the indicator as a goal to work toward during each repetition. 10. After each set of 10 deep breaths, practice coughing to be sure your lungs are clear. If you have an incision (the cut made at the time of surgery), support your incision when coughing by placing a pillow or rolled up towels firmly against it. Once you are able to get out of bed, walk around indoors and cough well. You may stop using the incentive spirometer when instructed by your caregiver.  RISKS AND COMPLICATIONS  Take your time so you do not get dizzy or light-headed.  If you are in pain, you may need to take or ask for pain medication before doing incentive spirometry. It is harder to take a deep breath if you are having pain. AFTER USE  Rest and breathe slowly and easily.  It can be helpful to  keep track of a log of your progress. Your caregiver can provide you with a simple table to help with this. If you are using the spirometer at home, follow these instructions: [place] IF:   You are having difficultly using the spirometer.  You have trouble using the spirometer as often as instructed.  Your pain medication is not giving enough relief while using the spirometer.  You develop fever of 100.5 F (38.1 C) or higher. SEEK IMMEDIATE MEDICAL CARE IF:   You cough up bloody sputum that had not been present before.  You develop fever of 102 F (38.9 C) or greater.  You develop worsening pain at or near the incision site. MAKE SURE YOU:   Understand these instructions.  Will watch your condition.  Will get help right away if you are not doing well or get worse. Document Released: 06/19/2006 Document Revised: 05/01/2011 Document Reviewed: 08/20/2006 ExitCare Patient Information 2014 ExitCare, [place].   ________________________________________________________________________  WHAT IS A BLOOD TRANSFUSION? Blood Transfusion Information  A transfusion is the replacement of blood or some of its parts. Blood is made up of multiple cells which provide different functions.  Red blood cells carry oxygen and are used for blood loss replacement.  White blood cells fight against infection.  Platelets control bleeding.  Plasma helps clot blood.  Other blood products are available for specialized needs, such as hemophilia or other clotting disorders. BEFORE THE TRANSFUSION  Who gives blood for transfusions?   Healthy volunteers who are fully evaluated to make sure their blood is safe. This is blood bank blood. Transfusion therapy is the safest it has ever been in the practice of medicine. Before blood is taken from a donor, a complete history is taken to make sure that person has no history of diseases nor engages in risky social behavior (examples are intravenous drug  use or sexual activity with multiple partners). The donor's travel history is screened to minimize risk of transmitting infections, such as malaria. The donated blood is tested for signs of infectious diseases, such as HIV and  hepatitis. The blood is then tested to be sure it is compatible with you in order to minimize the chance of a transfusion reaction. If you or a relative donates blood, this is often done in anticipation of surgery and is not appropriate for emergency situations. It takes many days to process the donated blood. RISKS AND COMPLICATIONS Although transfusion therapy is very safe and saves many lives, the main dangers of transfusion include:   Getting an infectious disease.  Developing a transfusion reaction. This is an allergic reaction to something in the blood you were given. Every precaution is taken to prevent this. The decision to have a blood transfusion has been considered carefully by your caregiver before blood is given. Blood is not given unless the benefits outweigh the risks. AFTER THE TRANSFUSION  Right after receiving a blood transfusion, you will usually feel much better and more energetic. This is especially true if your red blood cells have gotten low (anemic). The transfusion raises the level of the red blood cells which carry oxygen, and this usually causes an energy increase.  The nurse administering the transfusion will monitor you carefully for complications. HOME CARE INSTRUCTIONS  No special instructions are needed after a transfusion. You may find your energy is better. Speak with your caregiver about any limitations on activity for underlying diseases you may have. SEEK MEDICAL CARE IF:   Your condition is not improving after your transfusion.  You develop redness or irritation at the intravenous (IV) site. SEEK IMMEDIATE MEDICAL CARE IF:  Any of the following symptoms occur over the next 12 hours:  Shaking chills.  You have a temperature by mouth  above 102 F (38.9 C), not controlled by medicine.  Chest, back, or muscle pain.  People around you feel you are not acting correctly or are confused.  Shortness of breath or difficulty breathing.  Dizziness and fainting.  You get a rash or develop hives.  You have a decrease in urine output.  Your urine turns a dark color or changes to pink, red, or brown. Any of the following symptoms occur over the next 10 days:  You have a temperature by mouth above 102 F (38.9 C), not controlled by medicine.  Shortness of breath.  Weakness after normal activity.  The white part of the eye turns yellow (jaundice).  You have a decrease in the amount of urine or are urinating less often.  Your urine turns a dark color or changes to pink, red, or brown. Document Released: 02/04/2000 Document Revised: 05/01/2011 Document Reviewed: 09/23/2007 [facility] Patient Information 2014 [place], [place].  _______________________________________________________________________

## 2018-11-06 NOTE — Telephone Encounter (Signed)
error 

## 2018-11-07 ENCOUNTER — Encounter (HOSPITAL_COMMUNITY)
Admission: RE | Admit: 2018-11-07 | Discharge: 2018-11-07 | Disposition: A | Discharge status: Home / Self Care | Payer: Medicare Other | Source: Ambulatory Visit | Attending: Orthopedic Surgery | Admitting: Orthopedic Surgery

## 2018-11-07 ENCOUNTER — Encounter (HOSPITAL_COMMUNITY): Payer: Self-pay

## 2018-11-07 ENCOUNTER — Other Ambulatory Visit: Payer: Self-pay

## 2018-11-07 DIAGNOSIS — I509 Heart failure, unspecified: Secondary | ICD-10-CM | POA: Insufficient documentation

## 2018-11-07 DIAGNOSIS — J45909 Unspecified asthma, uncomplicated: Secondary | ICD-10-CM | POA: Diagnosis not present

## 2018-11-07 DIAGNOSIS — E079 Disorder of thyroid, unspecified: Secondary | ICD-10-CM | POA: Insufficient documentation

## 2018-11-07 DIAGNOSIS — Z01818 Encounter for other preprocedural examination: Secondary | ICD-10-CM | POA: Insufficient documentation

## 2018-11-07 DIAGNOSIS — Z7901 Long term (current) use of anticoagulants: Secondary | ICD-10-CM | POA: Insufficient documentation

## 2018-11-07 DIAGNOSIS — R9431 Abnormal electrocardiogram [ECG] [EKG]: Secondary | ICD-10-CM | POA: Insufficient documentation

## 2018-11-07 DIAGNOSIS — Z87442 Personal history of urinary calculi: Secondary | ICD-10-CM | POA: Insufficient documentation

## 2018-11-07 DIAGNOSIS — I110 Hypertensive heart disease with heart failure: Secondary | ICD-10-CM | POA: Insufficient documentation

## 2018-11-07 DIAGNOSIS — I4891 Unspecified atrial fibrillation: Secondary | ICD-10-CM | POA: Insufficient documentation

## 2018-11-07 DIAGNOSIS — Z79890 Hormone replacement therapy (postmenopausal): Secondary | ICD-10-CM | POA: Diagnosis not present

## 2018-11-07 DIAGNOSIS — M797 Fibromyalgia: Secondary | ICD-10-CM | POA: Insufficient documentation

## 2018-11-07 DIAGNOSIS — M1712 Unilateral primary osteoarthritis, left knee: Secondary | ICD-10-CM | POA: Insufficient documentation

## 2018-11-07 DIAGNOSIS — Z79899 Other long term (current) drug therapy: Secondary | ICD-10-CM | POA: Insufficient documentation

## 2018-11-07 LAB — BASIC METABOLIC PANEL
Anion gap: 7 (ref 5–15)
BUN: 20 mg/dL (ref 8–23)
CO2: 28 mmol/L (ref 22–32)
Calcium: 8.5 mg/dL — ABNORMAL LOW (ref 8.9–10.3)
Chloride: 106 mmol/L (ref 98–111)
Creatinine, Ser: 0.81 mg/dL (ref 0.44–1.00)
GFR calc Af Amer: >60 mL/min (ref >60)
GFR calc non Af Amer: >60 mL/min (ref >60)
Glucose, Bld: 88 mg/dL (ref 70–99)
Potassium: 4.5 mmol/L (ref 3.5–5.1)
Sodium: 141 mmol/L (ref 135–145)

## 2018-11-07 LAB — CBC
HCT: 37.3 % (ref 36.0–46.0)
Hemoglobin: 11.2 g/dL — ABNORMAL LOW (ref 12.0–15.0)
MCH: 29.9 pg (ref 26.0–34.0)
MCHC: 30 g/dL (ref 30.0–36.0)
MCV: 99.5 fL (ref 80.0–100.0)
Platelets: 188 10*3/uL (ref 150–400)
RBC: 3.75 MIL/uL — ABNORMAL LOW (ref 3.87–5.11)
RDW: 12.9 % (ref 11.5–15.5)
WBC: 4.4 10*3/uL (ref 4.0–10.5)
nRBC: 0 % (ref 0.0–0.2)

## 2018-11-07 LAB — SURGICAL PCR SCREEN
MRSA, PCR: NEGATIVE
Staphylococcus aureus: NEGATIVE

## 2018-11-07 LAB — ABO/RH: ABO/RH(D): O POS

## 2018-11-07 NOTE — Progress Notes (Addendum)
PCP - [name], MD Cardiologist - Pixie Casino, MD  Chest x-ray -  EKG - repeated today  Stress Test -  ECHO - epic 2020 Cardiac Cath -   Sleep Study -  CPAP -   Fasting Blood Sugar -  Checks Blood Sugar _____ times a day  Blood Thinner Instructions: hold eliquis x3 days Aspirin Instructions: Last Dose:  Anesthesia review:   Hx afib. Cardiac clearance Roby Lofts 10-11-2018 epic tele note  ekg repeated today per APP advisory; ekg reviewed by APP  Patient denies shortness of breath, fever, cough and chest pain at PAT appointment   Patient verbalized understanding of instructions that were given to them at the PAT appointment. Patient was also instructed that they will need to review over the PAT instructions again at home before surgery.

## 2018-11-07 NOTE — Progress Notes (Signed)
Patient advised to take metoprolol morning of surgery  per anesthesia beta blocker protocol. Patient defers to take metoprolol that morning and reports with past surgeries, she has taken her metoprolol and when arrived to hospital was told that her BP and HR were too low to proceed with surgery. She is concerned that this will occur with this upcoming surgery . Patient states she will take her metoprolol the night before as scheduled but not the morning of. RN advised patient to make surgery team aware on arrival that she has chosen not to take her metoprolol. Patient verbalize understanding.

## 2018-11-07 NOTE — Telephone Encounter (Signed)
LMVM for pt to return call for MRI results.  I spoke to her this am and she was at another appt and was not able to finish.

## 2018-11-07 NOTE — Telephone Encounter (Signed)
Pt has returned call to [facility], please call

## 2018-11-07 NOTE — Telephone Encounter (Signed)
I called pt and relayed the MRI results per SS/NP note.  (showing SVD,atrophy) age related changes. Unremarkable for sz work up.  She is to keep on keppra and see Dr. Jannifer Franklin in 03/2019 for RV.  Will discuss next plan of action. She verbalized understanding.

## 2018-11-11 ENCOUNTER — Other Ambulatory Visit (HOSPITAL_COMMUNITY)
Admission: RE | Admit: 2018-11-11 | Discharge: 2018-11-11 | Disposition: A | Discharge status: Home / Self Care | Payer: Medicare Other | Source: Ambulatory Visit | Attending: Orthopedic Surgery | Admitting: Orthopedic Surgery

## 2018-11-11 DIAGNOSIS — Z20828 Contact with and (suspected) exposure to other viral communicable diseases: Secondary | ICD-10-CM | POA: Diagnosis not present

## 2018-11-11 DIAGNOSIS — Z01812 Encounter for preprocedural laboratory examination: Secondary | ICD-10-CM | POA: Insufficient documentation

## 2018-11-11 NOTE — Progress Notes (Signed)
Anesthesia Chart Review   Case: [id number] Date/Time: 11/14/18 0915   Procedure: TOTAL KNEE ARTHROPLASTY (Left Knee) - 70 mins   Anesthesia type: Spinal   Pre-op diagnosis: Left knee osteoarthritis   [facility] ROOM 10 / [facility]   Surgeon: Paralee Cancel, MD      DISCUSSION:68 y.o. never smoker with h/o HTN, A-fib (on Eliquis) CHF, asthma, left knee OA scheduled for above procedure 11/14/2018 with Dr. Paralee Cancel.   Pt cleared by cardiology.  Per Roby Lofts, PA-C, "Jillian Eaton was last seen on 09/09/2018 by Dr. [name].  Since that day, [name] has done well from a cardiac standpoint. She is somewhat limited in mobility by knee pain but continues to walk around her apartment complex for exercise daily without complaints of chest pain or SOB. She can easily complete 4 METs without anginal complaints. Therefore, based on ACC/AHA guidelines, the patient would be at acceptable risk for the planned procedure without further cardiovascular testing. Per pharmacy recommendations, patient can hold eliquis 3 days prior to her upcoming knee replacement. Eliquis should be restarted as soon as she is cleared to do so by her surgeon.  Anticipate pt can proceed with planned procedure barring acute status change.   VS: BP (!) 120/97   Pulse 80   Temp 36.5 C (Oral)   Resp 18   Ht 5\' 6"  (1.676 m)   Wt 109.6 kg   SpO2 98%   BMI 38.99 kg/m   PROVIDERS: [name], MD is PCP   Lyman Bishop, MD is Cardiologist  LABS: Labs reviewed: Acceptable for surgery. (all labs ordered are listed, but only abnormal results are displayed)  Labs Reviewed  BASIC METABOLIC PANEL - Abnormal; Notable for the following components:      Result Value   Calcium 8.5 (*)    All other components within normal limits  CBC - Abnormal; Notable for the following components:   RBC 3.75 (*)    Hemoglobin 11.2 (*)    All other components within normal limits  SURGICAL PCR SCREEN  TYPE AND  SCREEN  ABO/RH     IMAGES:   EKG: 11/07/2018 Rate 72 bpm Atrial fibrillation Cannot rule out Anterior infarct , age undetermined Abnormal ECG Since last tracing HR has decreased  CV: Echo 05/27/2018 IMPRESSIONS    1. Study performed in atrial fibriallation.  2. The left ventricle has low normal systolic function, with an ejection fraction of 50-55%. The cavity size was normal. There is mildly increased left ventricular wall thickness. Left ventricular diastolic Doppler parameters are indeterminate.  3. Mild hypokinesis of the LV inferoseptum from midventricle to apex. Mild hypokinesis of the LV anteroseptum from base to apex.  4. Left atrial size was severely dilated.  5. Right atrial size was severely dilated.  6. The trivial pericardial effusion is posterior to the left ventricle.  7. The aortic valve was not well visualized. Aortic valve regurgitation is trivial by color flow Doppler.  8. No intracardiac thrombi or masses were visualized. No intracardiac source of emboli was noted, howevere image quality was suboptimal.  9. When compared to the prior study: Side by side comparison of images performed to 02/01/2018. LV function has not significantly changed, and regional wall motion abnormalities appear unchanged. RVSP has decreased, however TR velocity may have been  incompletely assessed due to poor acoustic windows on today's exam. 10. The interatrial septum was not well visualized. Past Medical History:  Diagnosis Date  . Anemia   .  Asthma    flare up last yr  . CHF (congestive heart failure) ([place])   . Chronic headache    " better now "   . DOE (dyspnea on exertion)    2D ECHO, 02/12/2012 - EF 60-65%, moderate concentric hypertrophy  . Fibromyalgia    nerve pain"left side at waist level" "can't lay on that side without pain" , "HOB elevation helps"; 9-17- since i lost the wieght , i dont  think i have this anymore   . Heart murmur   . Hematuria - cause not known     resolved   . History of kidney stones    x 2 '13, '14 surgery to remove  . Hypertension   . SBO (small bowel obstruction) ([place]) 06/07/2013  . SBO (small bowel obstruction) ([place])    rqueired admission 2020  . Sleep apnea    tested several times and came up negative  . Thyroid disease    "goiter"  . Transfusion history    10 yrs+    Past Surgical History:  Procedure Laterality Date  . ABDOMINAL ADHESION SURGERY  2004   open LOA - in [place]  . CARDIAC CATHETERIZATION  04/04/2010   No significant obstructive coronary artery disease  . CARDIOVERSION N/A 08/08/2017   Procedure: CARDIOVERSION;  Surgeon: Pixie Casino, MD;  Location: [place];  Service: Cardiovascular;  Laterality: N/A;  . CHOLECYSTECTOMY  1990  . COLONOSCOPY W/ POLYPECTOMY    . COLONOSCOPY WITH PROPOFOL N/A 04/10/2014   Procedure: COLONOSCOPY WITH PROPOFOL;  Surgeon: Beryle Beams, MD;  [facility];  Service: Endoscopy;  Laterality: N/A;  . EYE SURGERY     lasik 20-25 yrs ago  . GASTROPLASTY VERTICAL BANDED  1985  . [place]   In [place] for SBO  . REVERSE SHOULDER ARTHROPLASTY Right 03/29/2018   Procedure: REVERSE SHOULDER ARTHROPLASTY;  Surgeon: Netta Cedars, MD;  Location: [place];  Service: Orthopedics;  Laterality: Right;  . ROUX-EN-Y GASTRIC BYPASS  1992   Conversion VBG to RnYGB in [place], [place]  . SHOULDER ARTHROSCOPY WITH SUBACROMIAL DECOMPRESSION  2016   Dr Berenice Primas, [place], [place]  . SMALL INTESTINE SURGERY  2000   SBO - LOA w SB resection  . TOTAL THYROIDECTOMY  06/26/2012   Dr Celine Ahr, [facility]  . TUBAL LIGATION  1986  . URETHRAL DILATION  2012   w cystoscopy.  Dr Gaynelle Arabian    MEDICATIONS: . acetaminophen (TYLENOL) 650 MG CR tablet  . apixaban (ELIQUIS) 5 MG TABS tablet  . calcitRIOL (ROCALTROL) 0.5 MCG capsule  . calcium carbonate (OS-CAL - DOSED IN MG OF ELEMENTAL CALCIUM) 1250 (500 Ca) MG tablet  . clotrimazole (LOTRIMIN AF) 1 % cream   . cyanocobalamin (,VITAMIN B-12,) 1000 MCG/ML injection  . cyclobenzaprine (FLEXERIL) 10 MG tablet  . diclofenac sodium (VOLTAREN) 1 % GEL  . DULoxetine (CYMBALTA) 60 MG capsule  . eszopiclone (LUNESTA) 2 MG TABS tablet  . FERREX 150 150 MG capsule  . furosemide (LASIX) 20 MG tablet  . levETIRAcetam (KEPPRA) 500 MG tablet  . levothyroxine (SYNTHROID) 200 MCG tablet  . magnesium oxide (MAG-OX) 400 MG tablet  . metoprolol succinate (TOPROL-XL) 25 MG 24 hr tablet  . mirabegron ER (MYRBETRIQ) 50 MG TB24 tablet  . NONFORMULARY OR COMPOUNDED ITEM  . senna (SENOKOT) 8.6 MG TABS tablet  . traMADol (ULTRAM) 50 MG tablet  . traZODone (DESYREL) 50 MG tablet  . Vitamin D, Ergocalciferol, (DRISDOL) 1.25 MG (50000  UT) CAPS capsule   No current facility-administered medications for this encounter.      Maia Plan [facility] Pre-Surgical Testing [phone] 11/11/18  10:16 AM

## 2018-11-11 NOTE — Anesthesia Preprocedure Evaluation (Addendum)
Anesthesia Evaluation  Patient identified by MRN, date of birth, ID band Patient awake    Reviewed: Allergy & Precautions, NPO status , Patient's Chart, lab work & pertinent test results  Airway Mallampati: II  TM Distance: >3 FB Neck ROM: Full    Dental no notable dental hx. (+) Upper Dentures, Dental Advisory Given   Pulmonary asthma ,    Pulmonary exam normal breath sounds clear to auscultation       Cardiovascular hypertension, Pt. on medications +CHF  Normal cardiovascular exam+ dysrhythmias Atrial Fibrillation  Rhythm:Irregular Rate:Abnormal  9/17 Afib r 72   Neuro/Psych Seizures -, Well Controlled,  PSYCHIATRIC DISORDERS Depression    GI/Hepatic Neg liver ROS, GERD  ,  Endo/Other  Hypothyroidism   Renal/GU K+ 4.5 Cr 0.81     Musculoskeletal  (+) Fibromyalgia -  Abdominal (+) + obese,   Peds  Hematology Hgb 11.2 plt 188   Anesthesia Other Findings   Reproductive/Obstetrics                           Anesthesia Physical Anesthesia Plan  ASA: III  Anesthesia Plan: Spinal and Regional   Post-op Pain Management:    Induction:   PONV Risk Score and Plan: 2 and Treatment may vary due to age or medical condition, Ondansetron and Dexamethasone  Airway Management Planned: Nasal Cannula and Natural Airway  Additional Equipment: None  Intra-op Plan:   Post-operative Plan:   Informed Consent: I have reviewed the patients History and Physical, chart, labs and discussed the procedure including the risks, benefits and alternatives for the proposed anesthesia with the patient or authorized representative who has indicated his/her understanding and acceptance.     Dental advisory given  Plan Discussed with:   Anesthesia Plan Comments: (See PAT note 11/07/2018, Konrad Felix, PA-C On Eliquis check dose 9/20  Spinal plus L Adductor canal)      Anesthesia Quick Evaluation

## 2018-11-13 LAB — NOVEL CORONAVIRUS, NAA (HOSP ORDER, SEND-OUT TO REF LAB; TAT 18-24 HRS): SARS-CoV-2, NAA: NOT DETECTED

## 2018-11-14 ENCOUNTER — Other Ambulatory Visit: Payer: Self-pay

## 2018-11-14 ENCOUNTER — Encounter (HOSPITAL_COMMUNITY)
Admission: RE | Disposition: A | Discharge status: Home / Self Care | Payer: Self-pay | Source: Ambulatory Visit | Attending: Orthopedic Surgery

## 2018-11-14 ENCOUNTER — Encounter (HOSPITAL_COMMUNITY): Payer: Self-pay | Admitting: Emergency Medicine

## 2018-11-14 ENCOUNTER — Ambulatory Visit (HOSPITAL_COMMUNITY): Payer: Medicare Other | Admitting: Anesthesiology

## 2018-11-14 ENCOUNTER — Ambulatory Visit (HOSPITAL_COMMUNITY): Payer: Medicare Other | Admitting: Physician Assistant

## 2018-11-14 ENCOUNTER — Observation Stay (HOSPITAL_COMMUNITY)
Admission: RE | Admit: 2018-11-14 | Discharge: 2018-11-15 | Disposition: A | Discharge status: Home / Self Care | Payer: Medicare Other | Source: Ambulatory Visit | Attending: Orthopedic Surgery | Admitting: Orthopedic Surgery

## 2018-11-14 DIAGNOSIS — I130 Hypertensive heart and chronic kidney disease with heart failure and stage 1 through stage 4 chronic kidney disease, or unspecified chronic kidney disease: Secondary | ICD-10-CM | POA: Insufficient documentation

## 2018-11-14 DIAGNOSIS — Z96611 Presence of right artificial shoulder joint: Secondary | ICD-10-CM | POA: Insufficient documentation

## 2018-11-14 DIAGNOSIS — K219 Gastro-esophageal reflux disease without esophagitis: Secondary | ICD-10-CM | POA: Diagnosis not present

## 2018-11-14 DIAGNOSIS — M797 Fibromyalgia: Secondary | ICD-10-CM | POA: Insufficient documentation

## 2018-11-14 DIAGNOSIS — J4520 Mild intermittent asthma, uncomplicated: Secondary | ICD-10-CM | POA: Insufficient documentation

## 2018-11-14 DIAGNOSIS — M1712 Unilateral primary osteoarthritis, left knee: Secondary | ICD-10-CM | POA: Diagnosis not present

## 2018-11-14 DIAGNOSIS — R569 Unspecified convulsions: Secondary | ICD-10-CM | POA: Diagnosis not present

## 2018-11-14 DIAGNOSIS — Z96652 Presence of left artificial knee joint: Secondary | ICD-10-CM

## 2018-11-14 DIAGNOSIS — E539 Vitamin B deficiency, unspecified: Secondary | ICD-10-CM | POA: Diagnosis not present

## 2018-11-14 DIAGNOSIS — D638 Anemia in other chronic diseases classified elsewhere: Secondary | ICD-10-CM | POA: Diagnosis not present

## 2018-11-14 DIAGNOSIS — M25862 Other specified joint disorders, left knee: Secondary | ICD-10-CM | POA: Insufficient documentation

## 2018-11-14 DIAGNOSIS — M810 Age-related osteoporosis without current pathological fracture: Secondary | ICD-10-CM | POA: Diagnosis not present

## 2018-11-14 DIAGNOSIS — E039 Hypothyroidism, unspecified: Secondary | ICD-10-CM | POA: Insufficient documentation

## 2018-11-14 DIAGNOSIS — Z7901 Long term (current) use of anticoagulants: Secondary | ICD-10-CM | POA: Diagnosis not present

## 2018-11-14 DIAGNOSIS — J9611 Chronic respiratory failure with hypoxia: Secondary | ICD-10-CM | POA: Diagnosis not present

## 2018-11-14 DIAGNOSIS — M25762 Osteophyte, left knee: Secondary | ICD-10-CM | POA: Diagnosis not present

## 2018-11-14 DIAGNOSIS — E559 Vitamin D deficiency, unspecified: Secondary | ICD-10-CM | POA: Insufficient documentation

## 2018-11-14 DIAGNOSIS — G8918 Other acute postprocedural pain: Secondary | ICD-10-CM | POA: Diagnosis not present

## 2018-11-14 DIAGNOSIS — M238X2 Other internal derangements of left knee: Secondary | ICD-10-CM | POA: Diagnosis not present

## 2018-11-14 DIAGNOSIS — G894 Chronic pain syndrome: Secondary | ICD-10-CM | POA: Insufficient documentation

## 2018-11-14 DIAGNOSIS — N183 Chronic kidney disease, stage 3 (moderate): Secondary | ICD-10-CM | POA: Diagnosis not present

## 2018-11-14 DIAGNOSIS — F329 Major depressive disorder, single episode, unspecified: Secondary | ICD-10-CM | POA: Diagnosis not present

## 2018-11-14 DIAGNOSIS — Z79890 Hormone replacement therapy (postmenopausal): Secondary | ICD-10-CM | POA: Insufficient documentation

## 2018-11-14 DIAGNOSIS — I482 Chronic atrial fibrillation, unspecified: Secondary | ICD-10-CM | POA: Diagnosis not present

## 2018-11-14 DIAGNOSIS — I272 Pulmonary hypertension, unspecified: Secondary | ICD-10-CM | POA: Insufficient documentation

## 2018-11-14 DIAGNOSIS — Z79899 Other long term (current) drug therapy: Secondary | ICD-10-CM | POA: Insufficient documentation

## 2018-11-14 DIAGNOSIS — I110 Hypertensive heart disease with heart failure: Secondary | ICD-10-CM | POA: Diagnosis not present

## 2018-11-14 DIAGNOSIS — Z6839 Body mass index (BMI) 39.0-39.9, adult: Secondary | ICD-10-CM | POA: Insufficient documentation

## 2018-11-14 DIAGNOSIS — J449 Chronic obstructive pulmonary disease, unspecified: Secondary | ICD-10-CM | POA: Insufficient documentation

## 2018-11-14 DIAGNOSIS — I509 Heart failure, unspecified: Secondary | ICD-10-CM | POA: Diagnosis not present

## 2018-11-14 DIAGNOSIS — I5032 Chronic diastolic (congestive) heart failure: Secondary | ICD-10-CM | POA: Insufficient documentation

## 2018-11-14 DIAGNOSIS — Z9884 Bariatric surgery status: Secondary | ICD-10-CM | POA: Insufficient documentation

## 2018-11-14 HISTORY — PX: TOTAL KNEE ARTHROPLASTY: SHX125

## 2018-11-14 LAB — TYPE AND SCREEN
ABO/RH(D): O POS
Antibody Screen: NEGATIVE

## 2018-11-14 SURGERY — ARTHROPLASTY, KNEE, TOTAL
Anesthesia: Regional | Site: Knee | Laterality: Left

## 2018-11-14 MED ORDER — CALCITRIOL 0.5 MCG PO CAPS
0.5000 ug | ORAL_CAPSULE | Freq: Two times a day (BID) | ORAL | Status: DC
  Administered 2018-11-14 – 2018-11-15 (×2): 0.5 ug via ORAL
  Filled 2018-11-14 (×3): qty 1

## 2018-11-14 MED ORDER — PHENYLEPHRINE HCL (PRESSORS) 10 MG/ML IV SOLN
INTRAVENOUS | Status: AC
  Filled 2018-11-14: qty 1

## 2018-11-14 MED ORDER — HYDROCODONE-ACETAMINOPHEN 7.5-325 MG PO TABS
1.0000 | ORAL_TABLET | ORAL | Status: DC | PRN
  Administered 2018-11-14 – 2018-11-15 (×2): 2 via ORAL
  Filled 2018-11-14 (×4): qty 2

## 2018-11-14 MED ORDER — METOCLOPRAMIDE HCL 5 MG/ML IJ SOLN: 5.0000 mg | Freq: Three times a day (TID) | INTRAMUSCULAR | Status: DC | PRN

## 2018-11-14 MED ORDER — BUPIVACAINE HCL (PF) 0.25 % IJ SOLN
INTRAMUSCULAR | Status: DC | PRN
  Administered 2018-11-14: 30 mL

## 2018-11-14 MED ORDER — APIXABAN 2.5 MG PO TABS
2.5000 mg | ORAL_TABLET | Freq: Two times a day (BID) | ORAL | Status: DC
  Administered 2018-11-15: 2.5 mg via ORAL
  Filled 2018-11-14: qty 1

## 2018-11-14 MED ORDER — PHENYLEPHRINE 40 MCG/ML (10ML) SYRINGE FOR IV PUSH (FOR BLOOD PRESSURE SUPPORT)
PREFILLED_SYRINGE | INTRAVENOUS | Status: AC
  Filled 2018-11-14: qty 10

## 2018-11-14 MED ORDER — TRAZODONE HCL 50 MG PO TABS
50.0000 mg | ORAL_TABLET | Freq: Every day | ORAL | Status: DC
  Administered 2018-11-14: 50 mg via ORAL
  Filled 2018-11-14: qty 1

## 2018-11-14 MED ORDER — SODIUM CHLORIDE 0.9 % IR SOLN
Status: DC | PRN
  Administered 2018-11-14: 1000 mL

## 2018-11-14 MED ORDER — METHOCARBAMOL 500 MG IVPB - SIMPLE MED
500.0000 mg | Freq: Four times a day (QID) | INTRAVENOUS | Status: DC | PRN
  Filled 2018-11-14: qty 50

## 2018-11-14 MED ORDER — DEXAMETHASONE SODIUM PHOSPHATE 10 MG/ML IJ SOLN
10.0000 mg | Freq: Once | INTRAMUSCULAR | Status: AC
  Administered 2018-11-14: 10:00:00 5 mg via INTRAVENOUS

## 2018-11-14 MED ORDER — PHENOL 1.4 % MT LIQD: 1.0000 | OROMUCOSAL | Status: DC | PRN

## 2018-11-14 MED ORDER — DEXAMETHASONE SODIUM PHOSPHATE 10 MG/ML IJ SOLN
INTRAMUSCULAR | Status: AC
  Filled 2018-11-14: qty 1

## 2018-11-14 MED ORDER — CEFAZOLIN SODIUM-DEXTROSE 2-4 GM/100ML-% IV SOLN
2.0000 g | Freq: Four times a day (QID) | INTRAVENOUS | Status: AC
  Administered 2018-11-14 (×2): 2 g via INTRAVENOUS
  Filled 2018-11-14 (×2): qty 100

## 2018-11-14 MED ORDER — PROPOFOL 10 MG/ML IV BOLUS
INTRAVENOUS | Status: AC
  Filled 2018-11-14: qty 80

## 2018-11-14 MED ORDER — FENTANYL CITRATE (PF) 100 MCG/2ML IJ SOLN
INTRAMUSCULAR | Status: AC
  Filled 2018-11-14: qty 2

## 2018-11-14 MED ORDER — DIPHENHYDRAMINE HCL 12.5 MG/5ML PO ELIX
12.5000 mg | ORAL_SOLUTION | ORAL | Status: DC | PRN
  Administered 2018-11-14 – 2018-11-15 (×3): 25 mg via ORAL
  Filled 2018-11-14 (×3): qty 10

## 2018-11-14 MED ORDER — STERILE WATER FOR IRRIGATION IR SOLN
Status: DC | PRN
  Administered 2018-11-14: 2000 mL

## 2018-11-14 MED ORDER — 0.9 % SODIUM CHLORIDE (POUR BTL) OPTIME
TOPICAL | Status: DC | PRN
  Administered 2018-11-14: 1000 mL

## 2018-11-14 MED ORDER — LEVOTHYROXINE SODIUM 100 MCG PO TABS
200.0000 ug | ORAL_TABLET | Freq: Every day | ORAL | Status: DC
  Administered 2018-11-15: 200 ug via ORAL
  Filled 2018-11-14: qty 2

## 2018-11-14 MED ORDER — PROPOFOL 10 MG/ML IV BOLUS
INTRAVENOUS | Status: DC | PRN
  Administered 2018-11-14 (×2): 20 mg via INTRAVENOUS

## 2018-11-14 MED ORDER — TRANEXAMIC ACID-NACL 1000-0.7 MG/100ML-% IV SOLN
1000.0000 mg | INTRAVENOUS | Status: DC
  Filled 2018-11-14: qty 100

## 2018-11-14 MED ORDER — PROPOFOL 500 MG/50ML IV EMUL
INTRAVENOUS | Status: DC | PRN
  Administered 2018-11-14: 100 ug/kg/min via INTRAVENOUS

## 2018-11-14 MED ORDER — TRANEXAMIC ACID-NACL 1000-0.7 MG/100ML-% IV SOLN
1000.0000 mg | Freq: Once | INTRAVENOUS | Status: AC
  Administered 2018-11-14: 1000 mg via INTRAVENOUS
  Filled 2018-11-14: qty 100

## 2018-11-14 MED ORDER — GLYCOPYRROLATE PF 0.2 MG/ML IJ SOSY
PREFILLED_SYRINGE | INTRAMUSCULAR | Status: AC
  Filled 2018-11-14: qty 1

## 2018-11-14 MED ORDER — LEVETIRACETAM 500 MG PO TABS
500.0000 mg | ORAL_TABLET | Freq: Two times a day (BID) | ORAL | Status: DC
  Administered 2018-11-14 – 2018-11-15 (×3): 500 mg via ORAL
  Filled 2018-11-14 (×3): qty 1

## 2018-11-14 MED ORDER — BISACODYL 10 MG RE SUPP: 10.0000 mg | Freq: Every day | RECTAL | Status: DC | PRN

## 2018-11-14 MED ORDER — MIRABEGRON ER 25 MG PO TB24
50.0000 mg | ORAL_TABLET | Freq: Every day | ORAL | Status: DC
  Administered 2018-11-15: 50 mg via ORAL
  Filled 2018-11-14: qty 2

## 2018-11-14 MED ORDER — POLYETHYLENE GLYCOL 3350 17 G PO PACK
17.0000 g | PACK | Freq: Two times a day (BID) | ORAL | Status: DC
  Administered 2018-11-15: 17 g via ORAL
  Filled 2018-11-14 (×2): qty 1

## 2018-11-14 MED ORDER — FENTANYL CITRATE (PF) 100 MCG/2ML IJ SOLN
50.0000 ug | INTRAMUSCULAR | Status: DC
  Administered 2018-11-14: 50 ug via INTRAVENOUS
  Filled 2018-11-14: qty 2

## 2018-11-14 MED ORDER — ACETAMINOPHEN 325 MG PO TABS: 325.0000 mg | ORAL_TABLET | Freq: Four times a day (QID) | ORAL | Status: DC | PRN

## 2018-11-14 MED ORDER — MORPHINE SULFATE (PF) 2 MG/ML IV SOLN
0.5000 mg | INTRAVENOUS | Status: DC | PRN
  Administered 2018-11-14: 1 mg via INTRAVENOUS
  Filled 2018-11-14: qty 1

## 2018-11-14 MED ORDER — MIDAZOLAM HCL 2 MG/2ML IJ SOLN
1.0000 mg | INTRAMUSCULAR | Status: DC
  Administered 2018-11-14: 1 mg via INTRAVENOUS
  Filled 2018-11-14: qty 2

## 2018-11-14 MED ORDER — HYDROCODONE-ACETAMINOPHEN 5-325 MG PO TABS
1.0000 | ORAL_TABLET | ORAL | Status: DC | PRN
  Administered 2018-11-14: 2 via ORAL
  Administered 2018-11-15: 1 via ORAL
  Administered 2018-11-15: 08:00:00 2 via ORAL
  Filled 2018-11-14: qty 1
  Filled 2018-11-14 (×2): qty 2

## 2018-11-14 MED ORDER — METHOCARBAMOL 500 MG PO TABS
500.0000 mg | ORAL_TABLET | Freq: Four times a day (QID) | ORAL | Status: DC | PRN
  Administered 2018-11-14 – 2018-11-15 (×2): 500 mg via ORAL
  Filled 2018-11-14 (×2): qty 1

## 2018-11-14 MED ORDER — ONDANSETRON HCL 4 MG/2ML IJ SOLN
INTRAMUSCULAR | Status: AC
  Filled 2018-11-14: qty 2

## 2018-11-14 MED ORDER — ALUM & MAG HYDROXIDE-SIMETH 200-200-20 MG/5ML PO SUSP: 15.0000 mL | ORAL | Status: DC | PRN

## 2018-11-14 MED ORDER — BUPIVACAINE HCL (PF) 0.75 % IJ SOLN
INTRAMUSCULAR | Status: DC | PRN
  Administered 2018-11-14: 1.6 mL via INTRATHECAL

## 2018-11-14 MED ORDER — DEXAMETHASONE SODIUM PHOSPHATE 10 MG/ML IJ SOLN
10.0000 mg | Freq: Once | INTRAMUSCULAR | Status: AC
  Administered 2018-11-15: 10 mg via INTRAVENOUS
  Filled 2018-11-14: qty 1

## 2018-11-14 MED ORDER — PHENYLEPHRINE 40 MCG/ML (10ML) SYRINGE FOR IV PUSH (FOR BLOOD PRESSURE SUPPORT)
PREFILLED_SYRINGE | INTRAVENOUS | Status: DC | PRN
  Administered 2018-11-14 (×5): 80 ug via INTRAVENOUS

## 2018-11-14 MED ORDER — BUPIVACAINE HCL (PF) 0.25 % IJ SOLN
INTRAMUSCULAR | Status: AC
  Filled 2018-11-14: qty 30

## 2018-11-14 MED ORDER — METOCLOPRAMIDE HCL 5 MG PO TABS: 5.0000 mg | ORAL_TABLET | Freq: Three times a day (TID) | ORAL | Status: DC | PRN

## 2018-11-14 MED ORDER — POVIDONE-IODINE 10 % EX SWAB
2.0000 "application " | Freq: Once | CUTANEOUS | Status: AC
  Administered 2018-11-14: 2 via TOPICAL

## 2018-11-14 MED ORDER — KETOROLAC TROMETHAMINE 30 MG/ML IJ SOLN
INTRAMUSCULAR | Status: DC | PRN
  Administered 2018-11-14: 30 mg via INTRA_ARTICULAR

## 2018-11-14 MED ORDER — ROPIVACAINE HCL 7.5 MG/ML IJ SOLN
INTRAMUSCULAR | Status: DC | PRN
  Administered 2018-11-14: 20 mL via PERINEURAL

## 2018-11-14 MED ORDER — CELECOXIB 200 MG PO CAPS
200.0000 mg | ORAL_CAPSULE | Freq: Two times a day (BID) | ORAL | Status: DC
  Administered 2018-11-14 – 2018-11-15 (×3): 200 mg via ORAL
  Filled 2018-11-14 (×3): qty 1

## 2018-11-14 MED ORDER — SENNA 8.6 MG PO TABS
1.0000 | ORAL_TABLET | Freq: Every day | ORAL | Status: DC
  Administered 2018-11-15: 8.6 mg via ORAL
  Filled 2018-11-14: qty 1

## 2018-11-14 MED ORDER — DULOXETINE HCL 60 MG PO CPEP
60.0000 mg | ORAL_CAPSULE | Freq: Every day | ORAL | Status: DC
  Administered 2018-11-15: 60 mg via ORAL
  Filled 2018-11-14: qty 1

## 2018-11-14 MED ORDER — CLOTRIMAZOLE 1 % EX CREA: 1.0000 "application " | TOPICAL_CREAM | Freq: Two times a day (BID) | CUTANEOUS | Status: DC | PRN

## 2018-11-14 MED ORDER — ONDANSETRON HCL 4 MG PO TABS: 4.0000 mg | ORAL_TABLET | Freq: Four times a day (QID) | ORAL | Status: DC | PRN

## 2018-11-14 MED ORDER — KETOROLAC TROMETHAMINE 30 MG/ML IJ SOLN
INTRAMUSCULAR | Status: AC
  Filled 2018-11-14: qty 1

## 2018-11-14 MED ORDER — FERROUS SULFATE 325 (65 FE) MG PO TABS
325.0000 mg | ORAL_TABLET | Freq: Two times a day (BID) | ORAL | Status: DC
  Administered 2018-11-14 – 2018-11-15 (×2): 325 mg via ORAL
  Filled 2018-11-14 (×2): qty 1

## 2018-11-14 MED ORDER — SODIUM CHLORIDE (PF) 0.9 % IJ SOLN
INTRAMUSCULAR | Status: DC | PRN
  Administered 2018-11-14: 30 mL

## 2018-11-14 MED ORDER — ONDANSETRON HCL 4 MG/2ML IJ SOLN
INTRAMUSCULAR | Status: DC | PRN
  Administered 2018-11-14: 4 mg via INTRAVENOUS

## 2018-11-14 MED ORDER — MAGNESIUM OXIDE 400 (241.3 MG) MG PO TABS
400.0000 mg | ORAL_TABLET | Freq: Every day | ORAL | Status: DC
  Administered 2018-11-14 – 2018-11-15 (×2): 400 mg via ORAL
  Filled 2018-11-14 (×2): qty 1

## 2018-11-14 MED ORDER — CEFAZOLIN SODIUM-DEXTROSE 2-4 GM/100ML-% IV SOLN
2.0000 g | INTRAVENOUS | Status: AC
  Administered 2018-11-14: 2 g via INTRAVENOUS
  Filled 2018-11-14: qty 100

## 2018-11-14 MED ORDER — SODIUM CHLORIDE 0.9 % IV SOLN
INTRAVENOUS | Status: DC | PRN
  Administered 2018-11-14: 10:00:00 40 ug/min via INTRAVENOUS

## 2018-11-14 MED ORDER — METOPROLOL SUCCINATE ER 25 MG PO TB24
25.0000 mg | ORAL_TABLET | Freq: Two times a day (BID) | ORAL | Status: DC
  Administered 2018-11-14 – 2018-11-15 (×2): 25 mg via ORAL
  Filled 2018-11-14 (×2): qty 1

## 2018-11-14 MED ORDER — MAGNESIUM CITRATE PO SOLN: 1.0000 | Freq: Once | ORAL | Status: DC | PRN

## 2018-11-14 MED ORDER — ACETAMINOPHEN 10 MG/ML IV SOLN: 1000.0000 mg | Freq: Once | INTRAVENOUS | Status: DC | PRN

## 2018-11-14 MED ORDER — ONDANSETRON HCL 4 MG/2ML IJ SOLN: 4.0000 mg | Freq: Four times a day (QID) | INTRAMUSCULAR | Status: DC | PRN

## 2018-11-14 MED ORDER — MENTHOL 3 MG MT LOZG: 1.0000 | LOZENGE | OROMUCOSAL | Status: DC | PRN

## 2018-11-14 MED ORDER — ONDANSETRON HCL 4 MG/2ML IJ SOLN: 4.0000 mg | Freq: Once | INTRAMUSCULAR | Status: DC | PRN

## 2018-11-14 MED ORDER — CHLORHEXIDINE GLUCONATE 4 % EX LIQD: 60.0000 mL | Freq: Once | CUTANEOUS | Status: DC

## 2018-11-14 MED ORDER — DOCUSATE SODIUM 100 MG PO CAPS
100.0000 mg | ORAL_CAPSULE | Freq: Two times a day (BID) | ORAL | Status: DC
  Administered 2018-11-14 – 2018-11-15 (×3): 100 mg via ORAL
  Filled 2018-11-14 (×3): qty 1

## 2018-11-14 MED ORDER — PROPOFOL 10 MG/ML IV BOLUS
INTRAVENOUS | Status: AC
  Filled 2018-11-14: qty 40

## 2018-11-14 MED ORDER — LACTATED RINGERS IV SOLN
INTRAVENOUS | Status: DC
  Administered 2018-11-14 (×2): via INTRAVENOUS

## 2018-11-14 MED ORDER — FENTANYL CITRATE (PF) 100 MCG/2ML IJ SOLN: 25.0000 ug | INTRAMUSCULAR | Status: DC | PRN

## 2018-11-14 MED ORDER — SODIUM CHLORIDE (PF) 0.9 % IJ SOLN
INTRAMUSCULAR | Status: AC
  Filled 2018-11-14: qty 50

## 2018-11-14 MED ORDER — LIDOCAINE 2% (20 MG/ML) 5 ML SYRINGE
INTRAMUSCULAR | Status: AC
  Filled 2018-11-14: qty 5

## 2018-11-14 MED ORDER — ZOLPIDEM TARTRATE 5 MG PO TABS: 5.0000 mg | ORAL_TABLET | Freq: Every evening | ORAL | Status: DC | PRN

## 2018-11-14 MED ORDER — SODIUM CHLORIDE 0.9 % IV SOLN
INTRAVENOUS | Status: DC
  Administered 2018-11-14 (×2): via INTRAVENOUS

## 2018-11-14 MED ORDER — LIDOCAINE 2% (20 MG/ML) 5 ML SYRINGE
INTRAMUSCULAR | Status: DC | PRN
  Administered 2018-11-14: 60 mg via INTRAVENOUS

## 2018-11-14 SURGICAL SUPPLY — 61 items
ATTUNE MED ANAT PAT 38 KNEE (Knees) ×2 IMPLANT
ATTUNE PSFEM LTSZ5 NARCEM KNEE (Femur) ×2 IMPLANT
ATTUNE PSRP INSR SZ5 8 KNEE (Insert) ×2 IMPLANT
BAG ZIPLOCK 12X15 (MISCELLANEOUS) ×2 IMPLANT
BASE TIBIAL ROT PLAT SZ 5 KNEE (Knees) ×1 IMPLANT
BLADE SAW SGTL 11.0X1.19X90.0M (BLADE) ×2 IMPLANT
BLADE SAW SGTL 13.0X1.19X90.0M (BLADE) ×2 IMPLANT
BLADE SURG SZ10 CARB STEEL (BLADE) ×4 IMPLANT
BNDG ELASTIC 6X5.8 VLCR STR LF (GAUZE/BANDAGES/DRESSINGS) ×2 IMPLANT
BOWL SMART MIX CTS (DISPOSABLE) ×2 IMPLANT
CEMENT HV SMART SET (Cement) ×4 IMPLANT
COVER SURGICAL LIGHT HANDLE (MISCELLANEOUS) ×2 IMPLANT
COVER WAND RF STERILE (DRAPES) IMPLANT
CUFF TOURN SGL QUICK 34 (TOURNIQUET CUFF) ×1
CUFF TRNQT CYL 34X4.125X (TOURNIQUET CUFF) ×1 IMPLANT
DECANTER SPIKE VIAL GLASS SM (MISCELLANEOUS) ×4 IMPLANT
DERMABOND ADVANCED (GAUZE/BANDAGES/DRESSINGS) ×1
DERMABOND ADVANCED .7 DNX12 (GAUZE/BANDAGES/DRESSINGS) ×1 IMPLANT
DRAPE U-SHAPE 47X51 STRL (DRAPES) ×2 IMPLANT
DRESSING AQUACEL AG SP 3.5X10 (GAUZE/BANDAGES/DRESSINGS) ×1 IMPLANT
DRSG AQUACEL AG SP 3.5X10 (GAUZE/BANDAGES/DRESSINGS) ×2
DURAPREP 26ML APPLICATOR (WOUND CARE) ×4 IMPLANT
ELECT REM PT RETURN 15FT ADLT (MISCELLANEOUS) ×2 IMPLANT
GLOVE BIO SURGEON STRL SZ 6 (GLOVE) ×2 IMPLANT
GLOVE BIOGEL PI IND STRL 6.5 (GLOVE) ×1 IMPLANT
GLOVE BIOGEL PI IND STRL 7.5 (GLOVE) ×1 IMPLANT
GLOVE BIOGEL PI IND STRL 8.5 (GLOVE) ×1 IMPLANT
GLOVE BIOGEL PI INDICATOR 6.5 (GLOVE) ×1
GLOVE BIOGEL PI INDICATOR 7.5 (GLOVE) ×1
GLOVE BIOGEL PI INDICATOR 8.5 (GLOVE) ×1
GLOVE ECLIPSE 8.0 STRL XLNG CF (GLOVE) ×2 IMPLANT
GLOVE ORTHO TXT STRL SZ7.5 (GLOVE) ×4 IMPLANT
GOWN STRL REUS W/ TWL LRG LVL3 (GOWN DISPOSABLE) ×1 IMPLANT
GOWN STRL REUS W/TWL 2XL LVL3 (GOWN DISPOSABLE) ×2 IMPLANT
GOWN STRL REUS W/TWL LRG LVL3 (GOWN DISPOSABLE) ×3 IMPLANT
HANDPIECE INTERPULSE COAX TIP (DISPOSABLE) ×1
HOLDER FOLEY CATH W/STRAP (MISCELLANEOUS) ×2 IMPLANT
KIT TURNOVER KIT A (KITS) IMPLANT
MANIFOLD NEPTUNE II (INSTRUMENTS) ×2 IMPLANT
NDL SAFETY ECLIPSE 18X1.5 (NEEDLE) ×1 IMPLANT
NEEDLE HYPO 18GX1.5 SHARP (NEEDLE) ×1
NS IRRIG 1000ML POUR BTL (IV SOLUTION) ×2 IMPLANT
PACK TOTAL KNEE CUSTOM (KITS) ×2 IMPLANT
PIN DRILL FIX HALF THREAD (BIT) ×2 IMPLANT
PIN FIX SIGMA LCS THRD HI (PIN) ×2 IMPLANT
PROTECTOR NERVE ULNAR (MISCELLANEOUS) ×2 IMPLANT
SET HNDPC FAN SPRY TIP SCT (DISPOSABLE) ×1 IMPLANT
SET PAD KNEE POSITIONER (MISCELLANEOUS) ×2 IMPLANT
SUT MNCRL AB 4-0 PS2 18 (SUTURE) ×2 IMPLANT
SUT STRATAFIX PDS+ 0 24IN (SUTURE) ×2 IMPLANT
SUT VIC AB 1 CT1 27 (SUTURE) ×1
SUT VIC AB 1 CT1 27XBRD ANTBC (SUTURE) ×1 IMPLANT
SUT VIC AB 1 CT1 36 (SUTURE) ×2 IMPLANT
SUT VIC AB 2-0 CT1 27 (SUTURE) ×3
SUT VIC AB 2-0 CT1 TAPERPNT 27 (SUTURE) ×3 IMPLANT
SYR 3ML LL SCALE MARK (SYRINGE) ×2 IMPLANT
TIBIAL BASE ROT PLAT SZ 5 KNEE (Knees) ×2 IMPLANT
TRAY FOLEY MTR SLVR 14FR STAT (SET/KITS/TRAYS/PACK) ×2 IMPLANT
WATER STERILE IRR 1000ML POUR (IV SOLUTION) ×4 IMPLANT
WRAP KNEE MAXI GEL POST OP (GAUZE/BANDAGES/DRESSINGS) ×2 IMPLANT
YANKAUER SUCT BULB TIP 10FT TU (MISCELLANEOUS) ×2 IMPLANT

## 2018-11-14 NOTE — Progress Notes (Signed)
Notified Dr. Valma Cava that pt did not take metoprolol this morning due to having surgery cancelled in the past for bradycardia.  Pt took last night at 2200. Per Dr. Valma Cava, will treat during surgery if needed. No order to give this medication before surgery.

## 2018-11-14 NOTE — Discharge Instructions (Signed)

## 2018-11-14 NOTE — Anesthesia Procedure Notes (Signed)
Spinal  Patient location during procedure: OR Start time: 11/14/2018 9:38 AM End time: 11/14/2018 9:43 AM Staffing Resident/CRNA: Claudia Desanctis, CRNA Performed: resident/CRNA  Preanesthetic Checklist Completed: patient identified, site marked, surgical consent, pre-op evaluation, timeout performed, IV checked, risks and benefits discussed and monitors and equipment checked Spinal Block Patient position: sitting Prep: DuraPrep Patient monitoring: heart rate, cardiac monitor, continuous pulse ox and blood pressure Approach: midline Location: L3-4 Injection technique: single-shot Needle Needle type: Sprotte and Pencan  Needle gauge: 24 G Needle length: 10 cm Needle insertion depth: 8 cm Assessment Sensory level: T4

## 2018-11-14 NOTE — Interval H&P Note (Signed)
History and Physical Interval Note:  11/14/2018 9:29 AM  Jillian Eaton  has presented today for surgery, with the diagnosis of Left knee osteoarthritis.  The various methods of treatment have been discussed with the patient and family. After consideration of risks, benefits and other options for treatment, the patient has consented to  Procedure(s) with comments: TOTAL KNEE ARTHROPLASTY (Left) - 70 mins as a surgical intervention.  The patient's history has been reviewed, patient examined, no change in status, stable for surgery.  I have reviewed the patient's chart and labs.  Questions were answered to the patient's satisfaction.     Mauri Pole

## 2018-11-14 NOTE — Anesthesia Postprocedure Evaluation (Signed)
Anesthesia Post Note  Patient: Jillian Eaton  Procedure(s) Performed: TOTAL KNEE ARTHROPLASTY (Left Knee)     Patient location during evaluation: Nursing Unit Anesthesia Type: Regional and Spinal Level of consciousness: oriented and awake and alert Pain management: pain level controlled Vital Signs Assessment: post-procedure vital signs reviewed and stable Respiratory status: spontaneous breathing and respiratory function stable Cardiovascular status: blood pressure returned to baseline and stable Postop Assessment: no headache, no backache, no apparent nausea or vomiting and patient able to bend at knees Anesthetic complications: no    Last Vitals:  Vitals:   11/14/18 1230 11/14/18 1245  BP: 103/78 110/75  Pulse: 71 70  Resp: 14 13  Temp:  (!) 36.4 C  SpO2: 100% 99%    Last Pain:  Vitals:   11/14/18 1230  TempSrc:   PainSc: [name]

## 2018-11-14 NOTE — Op Note (Signed)
NAME:  Jillian Eaton [facility]                      MEDICAL RECORD NO.:  [id number]                             FACILITY:  [facility]      PHYSICIAN:  Pietro Cassis. Alvan Dame, M.D.  DATE OF BIRTH:  January 15, 1951      DATE OF PROCEDURE:  11/14/2018                                     OPERATIVE REPORT         PREOPERATIVE DIAGNOSIS:  Left knee osteoarthritis.      POSTOPERATIVE DIAGNOSIS:  Left knee osteoarthritis.      FINDINGS:  The patient was noted to have complete loss of cartilage and   bone-on-bone arthritis with associated osteophytes in all three compartments most severe in the medial and patellofemoral compartments of   the knee with a significant synovitis and associated effusion.  The patient had failed months of conservative treatment including medications, injection therapy, activity modification.     PROCEDURE:  Left total knee replacement.      COMPONENTS USED:  DePuy Attune rotating platform posterior stabilized knee   system, a size 5N femur, 5 tibia, size 8 mm PS AOX insert, and 38 anatomic patellar   button.      SURGEON:  Pietro Cassis. Alvan Dame, M.D.      ASSISTANT:  Griffith Citron, PA-C.      ANESTHESIA:  Regional and Spinal.      SPECIMENS:  None.      COMPLICATION:  None.      DRAINS:  None.  EBL: <200cc      TOURNIQUET TIME:   Total Tourniquet Time Documented: Thigh (Left) - 44 minutes Total: Thigh (Left) - 44 minutes  .      The patient was stable to the recovery room.      INDICATION FOR PROCEDURE:  Jillian Eaton is a 68 y.o. female patient of   mine.  The patient had been seen, evaluated, and treated for months conservatively in the   office with medication, activity modification, and injections.  The patient had   radiographic changes of bone-on-bone arthritis with endplate sclerosis and osteophytes noted.  Based on the radiographic changes and failed conservative measures, the patient   decided to proceed with definitive treatment, total knee  replacement.  Risks of infection, DVT, component failure, need for revision surgery, neurovascular injury were reviewed in the office setting.  The postop course was reviewed stressing the efforts to maximize post-operative satisfaction and function.  Consent was obtained for benefit of pain   relief.      PROCEDURE IN DETAIL:  The patient was brought to the operative theater.   Once adequate anesthesia, preoperative antibiotics, 2 gm of Ancef,1 gm of Tranexamic Acid, and 10 mg of Decadron administered, the patient was positioned supine with a left thigh tourniquet placed.  The  left lower extremity was prepped and draped in sterile fashion.  A time-   out was performed identifying the patient, planned procedure, and the appropriate extremity.      The left lower extremity was placed in the [facility] leg holder.  The leg was   exsanguinated, tourniquet elevated to 250 mmHg.  A  midline incision was   made followed by median parapatellar arthrotomy.  Following initial   exposure, attention was first directed to the patella.  Precut   measurement was noted to be 24 mm.  I resected down to 13-14 mm and used a   38 anatomic patellar button to restore patellar height as well as cover the cut surface.      The lug holes were drilled and a metal shim was placed to protect the   patella from retractors and saw blade during the procedure.      At this point, attention was now directed to the femur.  The femoral   canal was opened with a drill, irrigated to try to prevent fat emboli.  An   intramedullary rod was passed at 3 degrees valgus, 9 mm of bone was   resected off the distal femur.  Following this resection, the tibia was   subluxated anteriorly.  Using the extramedullary guide, 2 mm of bone was resected off   the proximal medial tibia.  We confirmed the gap would be   stable medially and laterally with a size 6 spacer block as well as confirmed that the tibial cut was perpendicular in the coronal  plane, checking with an alignment rod.      Once this was done, I sized the femur to be a size 5 in the anterior-   posterior dimension, chose a narrow component based on medial and   lateral dimension.  The size 5 rotation block was then pinned in   position anterior referenced using the C-clamp to set rotation.  The   anterior, posterior, and  chamfer cuts were made without difficulty nor   notching making certain that I was along the anterior cortex to help   with flexion gap stability.      The final box cut was made off the lateral aspect of distal femur.      At this point, the tibia was sized to be a size 5.  The size 5 tray was   then pinned in position through the medial third of the tubercle,   drilled, and keel punched.  Trial reduction was now carried with a 5 femur,  5 tibia, a size 8 mm PS insert, and the 38 anatomic patella botton.  The knee was brought to full extension with good flexion stability with the patella   tracking through the trochlea without application of pressure.  Given   all these findings the trial components removed.  Final components were   opened and cement was mixed.  The knee was irrigated with normal saline solution and pulse lavage.  The synovial lining was   then injected with 30 cc of 0.25% Marcaine with epinephrine, 1 cc of Toradol and 30 cc of NS for a total of 61 cc.     Final implants were then cemented onto cleaned and dried cut surfaces of bone with the knee brought to extension with a size 8 mm PS trial insert.      Once the cement had fully cured, excess cement was removed   throughout the knee.  I confirmed that I was satisfied with the range of   motion and stability, and the final size 8 mm PS AOX insert was chosen.  It was   placed into the knee.      The tourniquet had been let down at 44 minutes.  No significant   hemostasis was required.  The extensor mechanism was  then reapproximated using #1 Vicryl and #1 Stratafix sutures with  the knee   in flexion.  The   remaining wound was closed with 2-0 Vicryl and running 4-0 Monocryl.   The knee was cleaned, dried, dressed sterilely using Dermabond and   Aquacel dressing.  The patient was then   brought to recovery room in stable condition, tolerating the procedure   well.   Please note that Physician Assistant, Griffith Citron, PA-C was present for the entirety of the case, and was utilized for pre-operative positioning, peri-operative retractor management, general facilitation of the procedure and for primary wound closure at the end of the case.              Pietro Cassis Alvan Dame, M.D.    11/14/2018 11:20 AM

## 2018-11-14 NOTE — Evaluation (Signed)
Physical Therapy Evaluation Patient Details Name: Jillian Eaton MRN: [id number] DOB: Dec 06, 1950 Today's Date: 11/14/2018   History of Present Illness  Pt is 68 y.o. s/p Lt TKA with PMH significant for HTN, CHF, and fibromyalgia.  Clinical Impression  Jillian Eaton is a 68 y.o. female POD 0 s/p Lt TKA. Patient reports modified independence with mobility at baseline and she has been using a RW for gait for the last 2 years. Patient is now limited by functional impairments (see PT problem list below) and requires min assist/guard for transfers and gait with RW. Patient was able to ambulate ~75 feet with RW and minimal cues for safety. Patient instructed in exercise to facilitate ROM and circulation to manage edema. Patient will benefit from continued skilled PT interventions to address impairments and progress towards PLOF. Acute PT will follow to progress mobility and stair training in preparation for safe discharge home.      Follow Up Recommendations Follow surgeon's recommendation for DC plan and follow-up therapies    Equipment Recommendations  None recommended by PT    Recommendations for Other Services       Precautions / Restrictions Precautions Precautions: Fall Restrictions Weight Bearing Restrictions: No      Mobility  Bed Mobility Overal bed mobility: Needs Assistance Bed Mobility: Supine to Sit     Supine to sit: HOB elevated;Supervision     General bed mobility comments: no cues needed, pt using bed rails, no assist requried; pt slow with mobility  Transfers Overall transfer level: Needs assistance Equipment used: Rolling walker (2 wheeled) Transfers: Sit to/from Stand Sit to Stand: Min assist;From elevated surface         General transfer comment: cues required for safe hand placement on RW, min assist to initiate power up from slightly elevated EOB  Ambulation/Gait Ambulation/Gait assistance: Min guard Gait Distance (Feet): 75  Feet Assistive device: Rolling walker (2 wheeled) Gait Pattern/deviations: Step-through pattern;Decreased step length - right;Decreased step length - left;Decreased stride length;Decreased stance time - left;Trendelenburg Gait velocity: decreased   General Gait Details: cues required for hand placement on RW and to maintain safe proximety to walker; pt with significant trendelenburg and sway during gait  Stairs            Wheelchair Mobility    Modified Rankin (Stroke Patients Only)       Balance Overall balance assessment: Needs assistance Sitting-balance support: Feet supported;No upper extremity supported Sitting balance-Leahy Scale: Good     Standing balance support: During functional activity;Bilateral upper extremity supported Standing balance-Leahy Scale: Fair Standing balance comment: pt requires UE support for dynamic standing and gait            Pertinent Vitals/Pain Pain Assessment: Faces Faces Pain Scale: Hurts a little bit Pain Location: Lt knee Pain Descriptors / Indicators: Aching Pain Intervention(s): Limited activity within patient's tolerance;Monitored during session;Ice applied;Patient requesting pain meds-RN notified    Home Living Family/patient expects to be discharged to:: Private residence Living Arrangements: Spouse/significant other Available Help at Discharge: Family;Available 24 hours/day Type of Home: Apartment Home Access: Level entry;Elevator     Home Layout: One level Home Equipment: Walker - 2 wheels;Cane - single point;Shower seat;Grab bars - tub/shower;Grab bars - toilet Additional Comments: spouse has some health issues and is unable to do heavy lifting; pt is his primary caregiver, he can mobilize independently and does not require physical assistance    Prior Function Level of Independence: Independent with assistive device(s)  Comments: Pt reports she was using RW for ambulation for last 2 years due to pain and  was using SPC prior to that     Hand Dominance   Dominant Hand: Right    Extremity/Trunk Assessment   Upper Extremity Assessment Upper Extremity Assessment: Overall WFL for tasks assessed    Lower Extremity Assessment Lower Extremity Assessment: Generalized weakness;LLE deficits/detail LLE Deficits / Details: pt able to perform quad set, no buckling in WB       Communication   Communication: No difficulties  Cognition Arousal/Alertness: Awake/alert Behavior During Therapy: WFL for tasks assessed/performed Overall Cognitive Status: Within Functional Limits for tasks assessed         General Comments      Exercises Total Joint Exercises Ankle Circles/Pumps: AROM;Both;Seated;10 reps Quad Sets: AROM;5 reps;Supine;Seated;Left(2 sets (1x5 supine, 1x5 seated)) Heel Slides: Seated;5 reps;Left;AAROM(with belt)   Assessment/Plan    PT Assessment Patient needs continued PT services  PT Problem List Decreased strength;Decreased balance;Decreased range of motion;Decreased mobility;Decreased activity tolerance       PT Treatment Interventions DME instruction;Functional mobility training;Balance training;Patient/family education;Gait training;Therapeutic activities;Stair training;Therapeutic exercise    PT Goals (Current goals can be found in the Care Plan section)  Acute Rehab PT Goals Patient Stated Goal: to work with OP PT and get walking without and AD PT Goal Formulation: With patient Time For Goal Achievement: 11/21/18 Potential to Achieve Goals: Good    Frequency 7X/week    AM-PAC PT "6 Clicks" Mobility  Outcome Measure Help needed turning from your back to your side while in a flat bed without using bedrails?: A Little Help needed moving from lying on your back to sitting on the side of a flat bed without using bedrails?: A Little Help needed moving to and from a bed to a chair (including a wheelchair)?: A Little Help needed standing up from a chair using your  arms (e.g., wheelchair or bedside chair)?: A Little Help needed to walk in hospital room?: A Little Help needed climbing 3-5 steps with a railing? : A Little 6 Click Score: 18    End of Session Equipment Utilized During Treatment: Gait belt Activity Tolerance: Patient tolerated treatment well Patient left: with call bell/phone within reach;in chair;with chair alarm set Nurse Communication: Mobility status PT Visit Diagnosis: Difficulty in walking, not elsewhere classified (R26.2);Muscle weakness (generalized) (M62.81);Unsteadiness on feet (R26.81);Other abnormalities of gait and mobility (R26.89)    Time: [id number] PT Time Calculation (min) (ACUTE ONLY): 25 min   Charges:   PT Evaluation $PT Eval Low Complexity: 1 Low PT Treatments $Therapeutic Exercise: 8-22 mins        Kipp Brood, PT, DPT, [facility] Physical Therapist with [facility]  11/14/2018 5:47 PM

## 2018-11-14 NOTE — Progress Notes (Signed)
AssistedDr. Houser with left, ultrasound guided, adductor canal block. Side rails up, monitors on throughout procedure. See vital signs in flow sheet. Tolerated Procedure well.  

## 2018-11-14 NOTE — Progress Notes (Signed)
Per Dr. Alvan Dame, okay to not put TED hose on pt due to them being too tight.

## 2018-11-14 NOTE — Care Plan (Signed)
Ortho Bundle Case Management Note  Patient Details  Name: Jillian Eaton MRN: [id number] Date of Birth: July 25, 1950  L TKA on 11-14-18 DCP:  Home with spouse.  1 story apt with 0 ste.   DME:  No needs.  Has a RW and elevated toilets.   PT:  [facility].  PT eval scheduled on 11-18-18.                   DME Arranged:  N/A DME Agency:  NA  HH Arranged:  NA HH Agency:  NA  Additional Comments: Please contact me with any questions of if this plan should need to change.  Marianne Sofia, RN,CCM [facility]  [phone] 11/14/2018, 8:42 AM

## 2018-11-14 NOTE — Transfer of Care (Signed)
Immediate Anesthesia Transfer of Care Note  Patient: Jillian Eaton  Procedure(s) Performed: TOTAL KNEE ARTHROPLASTY (Left Knee)  Patient Location: PACU  Anesthesia Type:Spinal  Level of Consciousness: awake, alert , oriented and patient cooperative  Airway & Oxygen Therapy: Patient Spontanous Breathing and Patient connected to face mask  Post-op Assessment: Report given to RN and Post -op Vital signs reviewed and stable  Post vital signs: Reviewed and stable  Last Vitals:  Vitals Value Taken Time  BP    Temp    Pulse 86 11/14/18 1146  Resp 17 11/14/18 1146  SpO2 89 % 11/14/18 1146  Vitals shown include unvalidated device data.  Last Pain:  Vitals:   11/14/18 0755  TempSrc:   PainSc: 6       Patients Stated Pain Goal: 4 ([phone])  Complications: No apparent anesthesia complications

## 2018-11-14 NOTE — Anesthesia Procedure Notes (Signed)
Anesthesia Regional Block: Adductor canal block   Pre-Anesthetic Checklist: ,, timeout performed, Correct Patient, Correct Site, Correct Laterality, Correct Procedure, Correct Position, site marked, Risks and benefits discussed,  Surgical consent,  Pre-op evaluation,  At surgeon's request and post-op pain management  Laterality: Lower and Left  Prep: chloraprep       Needles:  Injection technique: Single-shot  Needle Type: Echogenic Needle     Needle Length: 9cm  Needle Gauge: 22     Additional Needles:   Procedures:,,,, ultrasound used (permanent image in chart),,,,  Narrative:  Start time: 11/14/2018 8:27 AM End time: 11/14/2018 8:36 AM Injection made incrementally with aspirations every 5 mL.  Performed by: Personally  Anesthesiologist: Barnet Glasgow, MD  Additional Notes: Block assessed prior to surgery. Pt tolerated procedure well.

## 2018-11-15 ENCOUNTER — Encounter (HOSPITAL_COMMUNITY): Payer: Self-pay | Admitting: Orthopedic Surgery

## 2018-11-15 DIAGNOSIS — I5032 Chronic diastolic (congestive) heart failure: Secondary | ICD-10-CM | POA: Diagnosis not present

## 2018-11-15 DIAGNOSIS — M238X2 Other internal derangements of left knee: Secondary | ICD-10-CM | POA: Diagnosis not present

## 2018-11-15 DIAGNOSIS — M1712 Unilateral primary osteoarthritis, left knee: Secondary | ICD-10-CM | POA: Diagnosis not present

## 2018-11-15 DIAGNOSIS — M25862 Other specified joint disorders, left knee: Secondary | ICD-10-CM | POA: Diagnosis not present

## 2018-11-15 DIAGNOSIS — M25762 Osteophyte, left knee: Secondary | ICD-10-CM | POA: Diagnosis not present

## 2018-11-15 DIAGNOSIS — I130 Hypertensive heart and chronic kidney disease with heart failure and stage 1 through stage 4 chronic kidney disease, or unspecified chronic kidney disease: Secondary | ICD-10-CM | POA: Diagnosis not present

## 2018-11-15 LAB — BASIC METABOLIC PANEL
Anion gap: 7 (ref 5–15)
BUN: 22 mg/dL (ref 8–23)
CO2: 25 mmol/L (ref 22–32)
Calcium: 8.1 mg/dL — ABNORMAL LOW (ref 8.9–10.3)
Chloride: 107 mmol/L (ref 98–111)
Creatinine, Ser: 1.04 mg/dL — ABNORMAL HIGH (ref 0.44–1.00)
GFR calc Af Amer: >60 mL/min (ref >60)
GFR calc non Af Amer: 56 mL/min — ABNORMAL LOW (ref >60)
Glucose, Bld: 116 mg/dL — ABNORMAL HIGH (ref 70–99)
Potassium: 4.9 mmol/L (ref 3.5–5.1)
Sodium: 139 mmol/L (ref 135–145)

## 2018-11-15 LAB — CBC
HCT: 29.7 % — ABNORMAL LOW (ref 36.0–46.0)
Hemoglobin: 8.6 g/dL — ABNORMAL LOW (ref 12.0–15.0)
MCH: 29.8 pg (ref 26.0–34.0)
MCHC: 29 g/dL — ABNORMAL LOW (ref 30.0–36.0)
MCV: 102.8 fL — ABNORMAL HIGH (ref 80.0–100.0)
Platelets: 173 10*3/uL (ref 150–400)
RBC: 2.89 MIL/uL — ABNORMAL LOW (ref 3.87–5.11)
RDW: 12.6 % (ref 11.5–15.5)
WBC: 8.4 10*3/uL (ref 4.0–10.5)
nRBC: 0 % (ref 0.0–0.2)

## 2018-11-15 MED ORDER — DOCUSATE SODIUM 100 MG PO CAPS: 100.0000 mg | ORAL_CAPSULE | Freq: Two times a day (BID) | ORAL | 0 refills | Status: DC

## 2018-11-15 MED ORDER — POLYETHYLENE GLYCOL 3350 17 G PO PACK: 17.0000 g | PACK | Freq: Two times a day (BID) | ORAL | 0 refills | Status: DC

## 2018-11-15 MED ORDER — HYDROCODONE-ACETAMINOPHEN 7.5-325 MG PO TABS: 1.0000 | ORAL_TABLET | ORAL | 0 refills | Status: DC | PRN

## 2018-11-15 NOTE — Progress Notes (Signed)
Physical Therapy Treatment Patient Details Name: Jillian Eaton [facility] MRN: [id number] DOB: [birth year] Today's Date: 11/15/2018    History of Present Illness Pt is 68 y.o. s/p Lt TKA with PMH significant for HTN, CHF, and fibromyalgia.    PT Comments    Pt is progressing well with mobility, she ambulated 180' with RW and completed HEP with supervision. She would like to review HEP one more time prior to DC. Will return for second session, then expect she will be ready to DC home from PT standpoint. 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 She plans to use [facility] to get to her OPPT session on 11/18/18 and has arranged meals on wheels.    Follow Up Recommendations  Follow surgeon's recommendation for DC plan and follow-up therapies     Equipment Recommendations  None recommended by PT    Recommendations for Other Services       Precautions / Restrictions Precautions Precautions: Fall Restrictions Weight Bearing Restrictions: No    Mobility  Bed Mobility Overal bed mobility: Needs Assistance Bed Mobility: Supine to Sit     Supine to sit: HOB elevated;Supervision     General bed mobility comments: no cues needed, pt using bed rails, no assist requried; pt slow with mobility  Transfers Overall transfer level: Needs assistance Equipment used: Rolling walker (2 wheeled) Transfers: Sit to/from Stand Sit to Stand: Supervision         General transfer comment: VCs hand placement  Ambulation/Gait Ambulation/Gait assistance: Supervision Gait Distance (Feet): 180 Feet Assistive device: Rolling walker (2 wheeled) Gait Pattern/deviations: Step-through pattern;Decreased step length - right;Decreased step length - left;Decreased stride length;Decreased stance time - left Gait velocity: decreased   General Gait Details: steady, no loss of balance   Stairs             Wheelchair Mobility    Modified Rankin (Stroke Patients Only)       Balance Overall  balance assessment: Needs assistance Sitting-balance support: Feet supported;No upper extremity supported Sitting balance-Leahy Scale: Good     Standing balance support: During functional activity;Bilateral upper extremity supported Standing balance-Leahy Scale: Fair Standing balance comment: pt requires UE support for dynamic standing and gait                            Cognition Arousal/Alertness: Awake/alert Behavior During Therapy: WFL for tasks assessed/performed Overall Cognitive Status: Within Functional Limits for tasks assessed                                        Exercises Total Joint Exercises Ankle Circles/Pumps: AROM;Both;Seated;10 reps Quad Sets: AROM;5 reps;Supine;Seated;Left(2 sets (1x5 supine, 1x5 seated)) Short Arc Quad: AROM;Left;10 reps;Supine Heel Slides: Left;AAROM;10 reps;Supine(with belt) Hip ABduction/ADduction: AAROM;Left;10 reps;Supine Straight Leg Raises: AAROM;Left;10 reps;Supine Long Arc Quad: AROM;Left;10 reps;Seated Knee Flexion: AROM;AAROM;Left;Seated;15 reps Goniometric ROM: 5-70* AAROM L knee    General Comments        Pertinent Vitals/Pain Pain Score: 6  Pain Location: L knee walking Pain Descriptors / Indicators: Sore Pain Intervention(s): Limited activity within patient's tolerance;Monitored during session;Premedicated before session;Ice applied    Home Living                      Prior Function            PT Goals (current goals can now be  found in the care plan section) Acute Rehab PT Goals Patient Stated Goal: to work with OP PT and get walking without and AD PT Goal Formulation: With patient Time For Goal Achievement: 11/21/18 Potential to Achieve Goals: Good Progress towards PT goals: Progressing toward goals    Frequency    7X/week      PT Plan Current plan remains appropriate    Co-evaluation              AM-PAC PT "6 Clicks" Mobility   Outcome Measure  Help  needed turning from your back to your side while in a flat bed without using bedrails?: A Little Help needed moving from lying on your back to sitting on the side of a flat bed without using bedrails?: A Little Help needed moving to and from a bed to a chair (including a wheelchair)?: None Help needed standing up from a chair using your arms (e.g., wheelchair or bedside chair)?: None Help needed to walk in hospital room?: None Help needed climbing 3-5 steps with a railing? : A Little 6 Click Score: 21    End of Session Equipment Utilized During Treatment: Gait belt Activity Tolerance: Patient tolerated treatment well Patient left: with call bell/phone within reach;in chair;with chair alarm set Nurse Communication: Mobility status PT Visit Diagnosis: Difficulty in walking, not elsewhere classified (R26.2);Muscle weakness (generalized) (M62.81);Unsteadiness on feet (R26.81);Other abnormalities of gait and mobility (R26.89)     Time: [id number] PT Time Calculation (min) (ACUTE ONLY): 37 min  Charges:  $Gait Training: 8-22 mins $Therapeutic Exercise: 8-22 mins                     Blondell Reveal Kistler PT 11/15/2018  Acute Rehabilitation Services Pager [phone] Office [phone]

## 2018-11-15 NOTE — Progress Notes (Signed)
Patient ID: Jillian Eaton, female   DOB: 1950/07/25, 68 y.o.   MRN: [id number] Subjective: 1 Day Post-Op Procedure(s) (LRB): TOTAL KNEE ARTHROPLASTY (Left)    Patient reports pain as mild to moderate but feeling better than expected.  Was up with therapy yesterday  Objective:   VITALS:   Vitals:   11/15/18 0137 11/15/18 0612  BP: 114/84 102/72  Pulse: 68 72  Resp: 16 16  Temp: (!) 97.5 F (36.4 C) 97.7 F (36.5 C)  SpO2: 100% 100%    Neurovascular intact Incision: dressing C/D/I  LABS Recent Labs    11/15/18 0322  HGB 8.6*  HCT 29.7*  WBC 8.4  PLT 173    Recent Labs    11/15/18 0322  NA 139  K 4.9  BUN 22  CREATININE 1.04*  GLUCOSE 116*    No results for input(s): LABPT, INR in the last 72 hours.   Assessment/Plan: 1 Day Post-Op Procedure(s) (LRB): TOTAL KNEE ARTHROPLASTY (Left)   Advance diet Up with therapy   Will plan for home discharge today after PT times 2 today  RTC in 2 weeks

## 2018-11-15 NOTE — Care Management Obs Status (Signed)
[facility] NOTIFICATION   Patient Details  Name: Jillian Eaton MRN: [id number] Date of Birth: 12/03/1950   Medicare Observation Status Notification Given:  Yes    Leeroy Cha, RN 11/15/2018, 9:55 AM

## 2018-11-15 NOTE — Progress Notes (Signed)
Physical Therapy Treatment Patient Details Name: Jillian Eaton [facility] MRN: [id number] DOB: 1951-02-10 Today's Date: 11/15/2018    History of Present Illness Pt is 68 y.o. s/p Lt TKA with PMH significant for HTN, CHF, and fibromyalgia.    PT Comments    Pt has met PT goals and is ready to DC home from PT standpoint. She ambulated 180' with RW and demonstrates good understanding of TKA HEP.   Follow Up Recommendations  Follow surgeon's recommendation for DC plan and follow-up therapies     Equipment Recommendations  None recommended by PT    Recommendations for Other Services       Precautions / Restrictions Precautions Precautions: Fall; knee; reviewed no pillow under knee Restrictions Weight Bearing Restrictions: No    Mobility  Bed Mobility Overal bed mobility: Needs Assistance Bed Mobility: Supine to Sit     Supine to sit: HOB elevated;Supervision     General bed mobility comments: no cues needed, pt using bed rails, no assist requried; pt slow with mobility  Transfers Overall transfer level: Needs assistance Equipment used: Rolling walker (2 wheeled) Transfers: Sit to/from Stand Sit to Stand: Modified independent (Device/Increase time)         General transfer comment: VCs hand placement  Ambulation/Gait Ambulation/Gait assistance: Supervision Gait Distance (Feet): 180 Feet Assistive device: Rolling walker (2 wheeled) Gait Pattern/deviations: Step-through pattern;Decreased step length - right;Decreased step length - left;Decreased stride length;Decreased stance time - left Gait velocity: decreased   General Gait Details: steady, no loss of balance   Stairs             Wheelchair Mobility    Modified Rankin (Stroke Patients Only)       Balance Overall balance assessment: Needs assistance Sitting-balance support: Feet supported;No upper extremity supported Sitting balance-Leahy Scale: Good     Standing balance support: During  functional activity;Bilateral upper extremity supported Standing balance-Leahy Scale: Fair Standing balance comment: pt requires UE support for dynamic standing and gait                            Cognition Arousal/Alertness: Awake/alert Behavior During Therapy: WFL for tasks assessed/performed Overall Cognitive Status: Within Functional Limits for tasks assessed                                        Exercises Total Joint Exercises Ankle Circles/Pumps: AROM;Both;Seated;10 reps Quad Sets: AROM;5 reps;Supine;Seated;Left(2 sets (1x5 supine, 1x5 seated)) Short Arc Quad: AROM;Left;10 reps;Supine Heel Slides: Left;AAROM;10 reps;Supine(with belt) Hip ABduction/ADduction: AAROM;Left;10 reps;Supine Straight Leg Raises: AAROM;Left;10 reps;Supine Long Arc Quad: AROM;Left;10 reps;Seated Knee Flexion: AROM;AAROM;Left;Seated;15 reps Goniometric ROM: 5-70* AAROM L knee    General Comments        Pertinent Vitals/Pain Pain Score: 5  Pain Location: L knee Pain Descriptors / Indicators: Sore Pain Intervention(s): Limited activity within patient's tolerance;Monitored during session;Premedicated before session;Ice applied    Home Living                      Prior Function            PT Goals (current goals can now be found in the care plan section) Acute Rehab PT Goals Patient Stated Goal: to work with OP PT and get walking without and AD PT Goal Formulation: With patient Time For Goal Achievement: 11/21/18 Potential to Achieve Goals: Good Progress  towards PT goals: Goals met/education completed, patient discharged from PT    Frequency    7X/week      PT Plan Current plan remains appropriate    Co-evaluation              AM-PAC PT "6 Clicks" Mobility   Outcome Measure  Help needed turning from your back to your side while in a flat bed without using bedrails?: A Little Help needed moving from lying on your back to sitting on the  side of a flat bed without using bedrails?: A Little Help needed moving to and from a bed to a chair (including a wheelchair)?: None Help needed standing up from a chair using your arms (e.g., wheelchair or bedside chair)?: None Help needed to walk in hospital room?: None Help needed climbing 3-5 steps with a railing? : A Little 6 Click Score: 21    End of Session Equipment Utilized During Treatment: Gait belt Activity Tolerance: Patient tolerated treatment well Patient left: with call bell/phone within reach;in bed Nurse Communication: Mobility status PT Visit Diagnosis: Difficulty in walking, not elsewhere classified (R26.2);Muscle weakness (generalized) (M62.81);Unsteadiness on feet (R26.81);Other abnormalities of gait and mobility (R26.89)     Time: 1120-1150 PT Time Calculation (min) (ACUTE ONLY): 30 min  Charges:  $Gait Training: 8-22 mins $Therapeutic Exercise: 8-22 mins                    Blondell Reveal Kistler PT 11/15/2018  Acute Rehabilitation Services Pager [phone] Office [phone]

## 2018-11-15 NOTE — Plan of Care (Signed)
Plan of care reviewed and discussed with the patient. 

## 2018-11-16 ENCOUNTER — Other Ambulatory Visit: Payer: Self-pay | Admitting: Podiatry

## 2018-11-18 DIAGNOSIS — M25661 Stiffness of right knee, not elsewhere classified: Secondary | ICD-10-CM | POA: Diagnosis not present

## 2018-11-18 DIAGNOSIS — M25561 Pain in right knee: Secondary | ICD-10-CM | POA: Diagnosis not present

## 2018-11-18 NOTE — Discharge Summary (Signed)
Physician Discharge Summary  Patient ID: Jillian Eaton [facility] MRN: [id number] DOB/AGE: 66-Oct-1952 66 y.o.  Admit date: 11/14/2018 Discharge date: 11/15/2018   Procedures:  Procedure(s) (LRB): TOTAL KNEE ARTHROPLASTY (Left)  Attending Physician:  Dr. Durene Romans   Admission Diagnoses:   Left knee primary OA / pain  Discharge Diagnoses:  Active Problems:   S/P left TKA   Status post total left knee replacement  Past Medical History:  Diagnosis Date   Anemia    Asthma    flare up last yr   CHF (congestive heart failure) (HCC)    Chronic headache    " better now "    DOE (dyspnea on exertion)    2D ECHO, 02/12/2012 - EF 60-65%, moderate concentric hypertrophy   Fibromyalgia    nerve pain"left side at waist level" "can't lay on that side without pain" , "HOB elevation helps"; 9-17- since i lost the wieght , i dont  think i have this anymore    Heart murmur    Hematuria - cause not known    resolved    History of kidney stones    x 2 '13, '14 surgery to remove   Hypertension    SBO (small bowel obstruction) (HCC) 06/07/2013   SBO (small bowel obstruction) (HCC)    rqueired admission 2020   Sleep apnea    tested several times and came up negative   Thyroid disease    "goiter"   Transfusion history    10 yrs+    HPI:    Jillian Eaton, 68 y.o. female, has a history of pain and functional disability in the left knee due to arthritis and has failed non-surgical conservative treatments for greater than 12 weeks to include NSAID's and/or analgesics, corticosteriod injections, viscosupplementation injections, use of assistive devices and activity modification.  Onset of symptoms was gradual, starting 20+ years ago with gradually worsening course since that time. The patient noted prior procedures on the knee to include  arthroscopy on the left knee(s).  Patient currently rates pain in the left knee(s) at 10 out of 10 with activity. Patient has night  pain, worsening of pain with activity and weight bearing, pain that interferes with activities of daily living, pain with passive range of motion, crepitus and joint swelling.  Patient has evidence of periarticular osteophytes and joint space narrowing by imaging studies.  There is no active infection.  Risks, benefits and expectations were discussed with the patient.  Risks including but not limited to the risk of anesthesia, blood clots, nerve damage, blood vessel damage, failure of the prosthesis, infection and up to and including death.  Patient understand the risks, benefits and expectations and wishes to proceed with surgery.   PCP: Harvest Forest, MD   Discharged Condition: good  Hospital Course:  Patient underwent the above stated procedure on 11/14/2018. Patient tolerated the procedure well and brought to the recovery room in good condition and subsequently to the floor.  POD #1 BP: 102/72 ; Pulse: 72 ; Temp: 97.7 F (36.5 C) ; Resp: 16 Patient reports pain as mild to moderate but feeling better than expected.  Was up with therapy yesterday. Neurovascular intact and incision: dressing C/D/I.   LABS  Basename    HGB     8.6  HCT     29.7    Discharge Exam: General appearance: alert, cooperative and no distress Extremities: Homans sign is negative, no sign of DVT, no edema, redness or tenderness in the calves  or thighs and no ulcers, gangrene or trophic changes  Disposition:  Home with follow up in 2 weeks   Follow-up Information    [facility] Go on 11/18/2018.   Why: You are scheduled for a physical therapy appointment on 11-18-18 at 2:30 pm. Contact information: [address] [place] [address] [phone]        Durene Romans, MD. Go on 11/27/2018.   Specialty: Orthopedic Surgery Why: You are scheduled for a post-operative appointment on 11-27-18 at 9:15 am.  Contact information: [address] [place]  [address] [phone]           Discharge Instructions    Call MD / Call 911   Complete by: As directed    If you experience chest pain or shortness of breath, CALL 911 and be transported to the hospital emergency room.  If you develope a fever above 101 F, pus (white drainage) or increased drainage or redness at the wound, or calf pain, call your surgeon's office.   Change dressing   Complete by: As directed    Maintain surgical dressing until follow up in the clinic. If the edges start to pull up, may reinforce with tape. If the dressing is no longer working, may remove and cover with gauze and tape, but must keep the area dry and clean.  Call with any questions or concerns.   Constipation Prevention   Complete by: As directed    Drink plenty of fluids.  Prune juice may be helpful.  You may use a stool softener, such as Colace (over the counter) 100 mg twice a day.  Use MiraLax (over the counter) for constipation as needed.   Diet - low sodium heart healthy   Complete by: As directed    Discharge instructions   Complete by: As directed    Maintain surgical dressing until follow up in the clinic. If the edges start to pull up, may reinforce with tape. If the dressing is no longer working, may remove and cover with gauze and tape, but must keep the area dry and clean.  Follow up in 2 weeks at [facility]. Call with any questions or concerns.   Increase activity slowly as tolerated   Complete by: As directed    Weight bearing as tolerated with assist device (walker, cane, etc) as directed, use it as long as suggested by your surgeon or therapist, typically at least 4-6 weeks.   TED hose   Complete by: As directed    Use stockings (TED hose) for 2 weeks on both leg(s).  You may remove them at night for sleeping.      Allergies as of 11/15/2018      Reactions   Hydrocodone Itching   Can tolerate with benadryl       Medication List    STOP taking these medications    acetaminophen 650 MG CR tablet Commonly known as: TYLENOL   diclofenac sodium 1 % Gel Commonly known as: VOLTAREN   senna 8.6 MG Tabs tablet Commonly known as: SENOKOT   traMADol 50 MG tablet Commonly known as: ULTRAM     TAKE these medications   apixaban 5 MG Tabs tablet Commonly known as: ELIQUIS Take 1 tablet (5 mg total) by mouth 2 (two) times daily.   calcitRIOL 0.5 MCG capsule Commonly known as: ROCALTROL TAKE 1 CAPSULE BY MOUTH TWICE A DAY   calcium carbonate 1250 (500 Ca) MG tablet Commonly known as: OS-CAL -  dosed in mg of elemental calcium Take 2 tablets (1,000 mg of elemental calcium total) by mouth 2 (two) times daily. What changed: when to take this   cyanocobalamin 1000 MCG/ML injection Commonly known as: (VITAMIN B-12) Inject 1 mL (1,000 mcg total) into the muscle every 30 (thirty) days.   cyclobenzaprine 10 MG tablet Commonly known as: FLEXERIL Take 2 tablets (20 mg total) by mouth 2 (two) times daily.   docusate sodium 100 MG capsule Commonly known as: Colace Take 1 capsule (100 mg total) by mouth 2 (two) times daily.   DULoxetine 60 MG capsule Commonly known as: CYMBALTA Take 1 capsule (60 mg total) by mouth daily.   eszopiclone 2 MG Tabs tablet Commonly known as: LUNESTA Take 1 tablet (2 mg total) by mouth at bedtime.   Ferrex 150 150 MG capsule Generic drug: iron polysaccharides Take 150 mg by mouth 2 (two) times daily.   fluorometholone 0.1 % ophthalmic suspension Commonly known as: FML Place 1 drop into both eyes 4 (four) times daily.   furosemide 20 MG tablet Commonly known as: LASIX Take 1 tablet (20 mg total) by mouth daily as needed (swelling).   HYDROcodone-acetaminophen 7.5-325 MG tablet Commonly known as: Norco Take 1-2 tablets by mouth every 4 (four) hours as needed for moderate pain.   levETIRAcetam 500 MG tablet Commonly known as: Keppra Take 1 tablet (500 mg total) by mouth 2 (two) times daily.   levothyroxine 200  MCG tablet Commonly known as: SYNTHROID TAKE 1 TABLET BY MOUTH EVERY DAY BEFORE BREAKFAST What changed: See the new instructions.   magnesium oxide 400 MG tablet Commonly known as: MAG-OX Take 1 tablet (400 mg total) by mouth daily.   metoprolol succinate 25 MG 24 hr tablet Commonly known as: TOPROL-XL TAKE 1 TABLET BY MOUTH IN AM AND TAKE YOUR 50 MG TABLETS IN THE EVENING TAKE WITH FOOD What changed:   how much to take  how to take this  when to take this  additional instructions   mirabegron ER 50 MG Tb24 tablet Commonly known as: Myrbetriq Take 1 tablet (50 mg total) by mouth daily.   NONFORMULARY OR COMPOUNDED ITEM [facility]:  Antifungal Cream - Terbinafine 3%, Fluconazole 2%, Tea Tree Oil 5%, Urea 10%, Ibuprofen 2% in DMSO Suspension #58ml. Apply to affected toenail(s) once daily (at bedtime) or twice daily.   polyethylene glycol 17 g packet Commonly known as: MIRALAX / GLYCOLAX Take 17 g by mouth 2 (two) times daily.   traZODone 50 MG tablet Commonly known as: DESYREL Take 1 tablet (50 mg total) by mouth at bedtime.   Vitamin D (Ergocalciferol) 1.25 MG (50000 UT) Caps capsule Commonly known as: DRISDOL Take 1 capsule (50,000 Units total) by mouth every Wednesday.            Discharge Care Instructions  (From admission, onward)         Start     Ordered   11/15/18 0000  Change dressing    Comments: Maintain surgical dressing until follow up in the clinic. If the edges start to pull up, may reinforce with tape. If the dressing is no longer working, may remove and cover with gauze and tape, but must keep the area dry and clean.  Call with any questions or concerns.   11/15/18 0254           Signed: Anastasio Auerbach. Nyeem Stoke   PA-C  11/18/2018, 9:02 PM

## 2018-11-20 ENCOUNTER — Other Ambulatory Visit: Payer: Self-pay

## 2018-11-20 ENCOUNTER — Telehealth: Payer: Self-pay | Admitting: Internal Medicine

## 2018-11-20 ENCOUNTER — Ambulatory Visit (INDEPENDENT_AMBULATORY_CARE_PROVIDER_SITE_OTHER): Payer: Medicare Other | Admitting: General Practice

## 2018-11-20 ENCOUNTER — Encounter: Payer: Self-pay | Admitting: Adult Health

## 2018-11-20 VITALS — BP 144/66 | HR 98 | Ht 66.0 in | Wt 249.8 lb

## 2018-11-20 DIAGNOSIS — I4891 Unspecified atrial fibrillation: Secondary | ICD-10-CM

## 2018-11-20 DIAGNOSIS — I4819 Other persistent atrial fibrillation: Secondary | ICD-10-CM

## 2018-11-20 DIAGNOSIS — I10 Essential (primary) hypertension: Secondary | ICD-10-CM | POA: Diagnosis not present

## 2018-11-20 DIAGNOSIS — I639 Cerebral infarction, unspecified: Secondary | ICD-10-CM

## 2018-11-20 DIAGNOSIS — I272 Pulmonary hypertension, unspecified: Secondary | ICD-10-CM | POA: Diagnosis not present

## 2018-11-20 DIAGNOSIS — R0789 Other chest pain: Secondary | ICD-10-CM | POA: Diagnosis not present

## 2018-11-20 NOTE — Telephone Encounter (Signed)
Spoke with patient and

## 2018-11-20 NOTE — Patient Instructions (Signed)
Medication Instructions:  [name], NP recommends that you continue on your current medications as directed. Please refer to the Current Medication list given to you today.  If you need a refill on your cardiac medications before your next appointment, please call your pharmacy.   Lab work: Your physician recommends that you return for lab work TODAY.  If you have labs (blood work) drawn today and your tests are completely normal, you will receive your results only by: Marland Kitchen MyChart Message (if you have MyChart) OR . A paper copy in the mail If you have any lab test that is abnormal or we need to change your treatment, we will call you to review the results.  Follow-Up: At [facility], you and your health needs are our priority.  As part of our continuing mission to provide you with exceptional heart care, we have created designated Provider Care Teams.  These Care Teams include your primary Cardiologist (physician) and Advanced Practice Providers (APPs -  Physician Assistants and Nurse Practitioners) who all work together to provide you with the care you need, when you need it. You will need a follow up appointment in 3 months.  Please call our office 2 months in advance to schedule this appointment.  You may see Pixie Casino, MD or one of the following Advanced Practice Providers on your designated Care Team: [place], [place] . Fabian Sharp, PA-C

## 2018-11-20 NOTE — Telephone Encounter (Signed)
Spoke with patient and she has had sharp chest pains 3 times today with increase shortness of breath since Monday. Patient did have surgery recently. Scheduled appointment with Arnold Long DNP. Patient aware of date, time, and location

## 2018-11-20 NOTE — Telephone Encounter (Signed)
° °  Pt c/o of Chest Pain: STAT if CP now or developed within 24 hours  1. Are you having CP right now? no  2. Are you experiencing any other symptoms (ex. SOB, nausea, vomiting, sweating)? SOB when moving around  3. How long have you been experiencing CP? Started oday  4. Is your CP continuous or coming and going? Coming and going  5. Have you taken Nitroglycerin? no ?

## 2018-11-20 NOTE — Progress Notes (Addendum)
Cardiology Clinic Note   Patient Name: [facility] Date of Encounter: 11/20/2018  Primary Care Provider:  [name], MD Primary Cardiologist:  Pixie Casino, MD  Patient Profile    Jillian Eaton 68 year old female presents today for an evaluation of her acute chest pain.  Past Medical History    Past Medical History:  Diagnosis Date  . Anemia   . Asthma    flare up last yr  . CHF (congestive heart failure) ([place])   . Chronic headache    " better now "   . DOE (dyspnea on exertion)    2D ECHO, 02/12/2012 - EF 60-65%, moderate concentric hypertrophy  . Fibromyalgia    nerve pain"left side at waist level" "can't lay on that side without pain" , "HOB elevation helps"; 9-17- since i lost the wieght , i dont  think i have this anymore   . Heart murmur   . Hematuria - cause not known    resolved   . History of kidney stones    x 2 '13, '14 surgery to remove  . Hypertension   . SBO (small bowel obstruction) ([place]) 06/07/2013  . SBO (small bowel obstruction) ([place])    rqueired admission 2020  . Sleep apnea    tested several times and came up negative  . Thyroid disease    "goiter"  . Transfusion history    10 yrs+   Past Surgical History:  Procedure Laterality Date  . ABDOMINAL ADHESION SURGERY  2004   open LOA - in [place]  . CARDIAC CATHETERIZATION  04/04/2010   No significant obstructive coronary artery disease  . CARDIOVERSION N/A 08/08/2017   Procedure: CARDIOVERSION;  Surgeon: Pixie Casino, MD;  Location: [name];  Service: Cardiovascular;  Laterality: N/A;  . CHOLECYSTECTOMY  1990  . COLONOSCOPY W/ POLYPECTOMY    . COLONOSCOPY WITH PROPOFOL N/A 04/10/2014   Procedure: COLONOSCOPY WITH PROPOFOL;  Surgeon: Beryle Beams, MD;  Location: [facility];  Service: Endoscopy;  Laterality: N/A;  . EYE SURGERY     lasik 20-25 yrs ago  . GASTROPLASTY VERTICAL BANDED  1985  . [place]   In [place] for  SBO  . REVERSE SHOULDER ARTHROPLASTY Right 03/29/2018   Procedure: REVERSE SHOULDER ARTHROPLASTY;  Surgeon: Netta Cedars, MD;  Location: [facility];  Service: Orthopedics;  Laterality: Right;  . ROUX-EN-Y GASTRIC BYPASS  1992   Conversion VBG to RnYGB in [place], [place]  . SHOULDER ARTHROSCOPY WITH SUBACROMIAL DECOMPRESSION  2016   Dr Berenice Primas, [place], [place]  . SMALL INTESTINE SURGERY  2000   SBO - LOA w SB resection  . TOTAL KNEE ARTHROPLASTY Left 11/14/2018   Procedure: TOTAL KNEE ARTHROPLASTY;  Surgeon: Paralee Cancel, MD;  Location: [facility];  Service: Orthopedics;  Laterality: Left;  70 mins  . TOTAL THYROIDECTOMY  06/26/2012   Dr Celine Ahr, [facility]  . TUBAL LIGATION  1986  . URETHRAL DILATION  2012   w cystoscopy.  Dr Gaynelle Arabian    Allergies  Allergies  Allergen Reactions  . Hydrocodone Itching    Can tolerate with benadryl      History of Present Illness    Ms. Lingo was last seen by Dr. [name] on 09/09/2018.  During that time she was doing well and losing weight for an upcoming surgery.  Her blood pressure was low normal at that time and her beta-blocker has been decreased due to hypotension.  She was expressing interest  in pulmonary rehab and was at baseline with NYHA class II-III symptoms.  Her last echocardiogram on 05/27/2018 showed an LVEF of 50 to 55% mildly increased left ventricular wall thickness, mild hypokinesis of LV inferior septal from mid ventricular to apex mild hypokinesis of LV anterior septum from base to apex.  Her left atria was severely dilated, her right atria severely dilated, trivial pericardial effusion, and aortic valve regurgitation trivial.  She underwent total knee arthroplasty on 11/14/2018.  Her PMH also includes anemia, asthma, CHF, chronic headache, dyspnea on exertion, pulmonary hypertension, essential hypertension, chronic atrial fibrillation acute on chronic respiratory failure with hypoxemia and hypercapnia, GERD, osteoporosis, DJD, CKD stage III,  morbid obesity BMI 40-49.9, seizures thrombocytopenia, fibromyalgia, bariatric surgery, vitamin D deficiency, palpitations, status post left total knee replacement, and numbness in both hands.  She presents the clinic today and states she has had 3 episodes of sharp chest pain today.  She describes the pain as sharp lasting for seconds and subsiding with rest.  Pain is located on the left side of her chest above her left breast.  She states the pain is nonexertional.  Her pain is reproduced through deep palpation and with movement.  She states that after her knee surgery she has been relying heavily on her walker and using her arms much more with ambulation.  She is also noticed increased shortness of breath however this is only with exertion during physical activity and physical therapy.  Her breathing returns to baseline when she is at rest.  She denies lower extremity edema, fatigue, palpitations, melena, hematuria, hemoptysis, diaphoresis, weakness, presyncope, syncope, orthopnea, and PND.   Home Medications    Prior to Admission medications   Medication Sig Start Date End Date Taking? Authorizing Provider  apixaban (ELIQUIS) 5 MG TABS tablet Take 1 tablet (5 mg total) by mouth 2 (two) times daily. 04/05/18   Hennie Duos, MD  calcitRIOL (ROCALTROL) 0.5 MCG capsule TAKE 1 CAPSULE BY MOUTH TWICE A DAY 10/10/18   Philemon Kingdom, MD  calcium carbonate (OS-CAL - DOSED IN MG OF ELEMENTAL CALCIUM) 1250 (500 Ca) MG tablet Take 2 tablets (1,000 mg of elemental calcium total) by mouth 2 (two) times daily. Patient taking differently: Take 2 tablets by mouth 2 (two) times daily with a meal.  04/05/18   Hennie Duos, MD  clotrimazole (LOTRIMIN) 1 % cream APPLY TO AFFECTED AREA TWICE A DAY 11/18/18   Marzetta Board, DPM  cyanocobalamin (,VITAMIN B-12,) 1000 MCG/ML injection Inject 1 mL (1,000 mcg total) into the muscle every 30 (thirty) days. 04/05/18   Hennie Duos, MD  cyclobenzaprine  (FLEXERIL) 10 MG tablet Take 2 tablets (20 mg total) by mouth 2 (two) times daily. 04/05/18   Hennie Duos, MD  docusate sodium (COLACE) 100 MG capsule Take 1 capsule (100 mg total) by mouth 2 (two) times daily. 11/15/18   Danae Orleans, PA-C  DULoxetine (CYMBALTA) 60 MG capsule Take 1 capsule (60 mg total) by mouth daily. 04/05/18   Hennie Duos, MD  eszopiclone (LUNESTA) 2 MG TABS tablet Take 1 tablet (2 mg total) by mouth at bedtime. 04/05/18   Hennie Duos, MD  FERREX 150 150 MG capsule Take 150 mg by mouth 2 (two) times daily. 05/01/18   [provider]  fluorometholone (FML) 0.1 % ophthalmic suspension Place 1 drop into both eyes 4 (four) times daily. 10/29/18   [provider]  furosemide (LASIX) 20 MG tablet Take 1 tablet (20 mg  total) by mouth daily as needed (swelling). 06/04/18 09/02/18  Hilty, Nadean Corwin, MD  HYDROcodone-acetaminophen (NORCO) 7.5-325 MG tablet Take 1-2 tablets by mouth every 4 (four) hours as needed for moderate pain. 11/15/18   Danae Orleans, PA-C  levETIRAcetam (KEPPRA) 500 MG tablet Take 1 tablet (500 mg total) by mouth 2 (two) times daily. 10/02/18   Suzzanne Cloud, NP  levothyroxine (SYNTHROID) 200 MCG tablet TAKE 1 TABLET BY MOUTH EVERY DAY BEFORE BREAKFAST Patient taking differently: Take 200 mcg by mouth daily before breakfast.  06/14/18   Hilty, Nadean Corwin, MD  magnesium oxide (MAG-OX) 400 MG tablet Take 1 tablet (400 mg total) by mouth daily. 04/05/18   Hennie Duos, MD  metoprolol succinate (TOPROL-XL) 25 MG 24 hr tablet TAKE 1 TABLET BY MOUTH IN AM AND TAKE YOUR 50 MG TABLETS IN THE EVENING TAKE WITH FOOD Patient taking differently: Take 25 mg by mouth See admin instructions. TAKE 1 TABLET BY MOUTH IN AM AND TAKE YOUR 25 MG TABLETS IN THE EVENING TAKE WITH FOOD.  Takes 50 total 06/27/18   Hilty, Nadean Corwin, MD  mirabegron ER (MYRBETRIQ) 50 MG TB24 tablet Take 1 tablet (50 mg total) by mouth daily. 04/05/18   Hennie Duos, MD   NONFORMULARY OR COMPOUNDED ITEM [facility]:  Antifungal Cream - Terbinafine 3%, Fluconazole 2%, Tea Tree Oil 5%, Urea 10%, Ibuprofen 2% in DMSO Suspension #45ml. Apply to affected toenail(s) once daily (at bedtime) or twice daily. 05/07/18   Marzetta Board, DPM  polyethylene glycol (MIRALAX / GLYCOLAX) 17 g packet Take 17 g by mouth 2 (two) times daily. 11/15/18   Danae Orleans, PA-C  traZODone (DESYREL) 50 MG tablet Take 1 tablet (50 mg total) by mouth at bedtime. 04/05/18   Hennie Duos, MD  Vitamin D, Ergocalciferol, (DRISDOL) 1.25 MG (50000 UT) CAPS capsule Take 1 capsule (50,000 Units total) by mouth every Wednesday. 04/10/18   Hennie Duos, MD    Family History    Family History  Problem Relation Age of Onset  . Diabetes Mother   . Epilepsy Mother   . Cancer Mother        Breast  . Hypertension Mother   . Breast cancer Mother   . Seizures Mother   . Kidney disease Father   . Diabetes Father   . Hypertension Father   . Asthma Father   . Heart disease Father   . Epilepsy Sister   . Cancer Maternal Grandmother   . Breast cancer Maternal Grandmother   . Cancer Paternal Grandmother   . Breast cancer Paternal Grandmother    She indicated that her mother is deceased. She indicated that her father is deceased. She indicated that three of her four sisters are alive. She indicated that her maternal grandmother is deceased. She indicated that her maternal grandfather is deceased. She indicated that her paternal grandmother is deceased. She indicated that her paternal grandfather is deceased. She indicated that all of her three children are alive.  Social History    Social History   Socioeconomic History  . Marital status: Married    Spouse name: Not on file  . Number of children: 3  . Years of education: Not on file  . Highest education level: Master's degree (e.g., MA, MS, MEng, MEd, MSW, MBA)  Occupational History  . Occupation: Self employed Scientist, physiological  Social Needs  . Financial resource strain: Not on file  . Food insecurity    Worry: Not  on file    Inability: Not on file  . Transportation needs    Medical: Not on file    Non-medical: Not on file  Tobacco Use  . Smoking status: Never Smoker  . Smokeless tobacco: Never Used  Substance and Sexual Activity  . Alcohol use: No    Comment: wine occ  . Drug use: No    Types: Oxycodone    Comment: perscribed  . Sexual activity: Not Currently    Birth control/protection: None  Lifestyle  . Physical activity    Days per week: Not on file    Minutes per session: Not on file  . Stress: Not on file  Relationships  . Social Herbalist on phone: Not on file    Gets together: Not on file    Attends religious service: Not on file    Active member of club or organization: Not on file    Attends meetings of clubs or organizations: Not on file    Relationship status: Not on file  . Intimate partner violence    Fear of current or ex partner: Not on file    Emotionally abused: Not on file    Physically abused: Not on file    Forced sexual activity: Not on file  Other Topics Concern  . Not on file  Social History Narrative   Right handed   1 cup of caffeine per day          Review of Systems    General:  No chills, fever, night sweats or weight changes.  Cardiovascular:  No chest pain, dyspnea on exertion, edema, orthopnea, palpitations, paroxysmal nocturnal dyspnea. Dermatological: No rash, lesions/masses Respiratory: No cough, dyspnea Urologic: No hematuria, dysuria Abdominal:   No nausea, vomiting, diarrhea, bright red blood per rectum, melena, or hematemesis Neurologic:  No visual changes, wkns, changes in mental status. All other systems reviewed and are otherwise negative except as noted above.  Physical Exam    VS:  BP (!) 144/66   Pulse 98   Ht 5\' 6"  (1.676 m)   Wt 249 lb 12.8 oz (113.3 kg)   BMI 40.32 kg/m  , BMI Body mass index is 40.32 kg/m.  GEN: Well nourished, well developed, in no acute distress. HEENT: normal. Neck: Supple, no JVD, carotid bruits, or masses. Cardiac: Irregularly irregular, no murmurs, rubs, or gallops. No clubbing, cyanosis, left lower extremity nonpitting edema.  Radials/DP/PT 2+ and equal bilaterally.  Respiratory:  Respirations regular and unlabored, clear to auscultation bilaterally. GI: Soft, nontender, nondistended, BS + x 4. MS: no deformity or atrophy. Skin: warm and dry, no rash. Neuro:  Strength and sensation are intact. Psych: Normal affect.  Accessory Clinical Findings    ECG personally reviewed by me today-atrial fibrillation 98 bpm- No acute changes  EKG 11/07/2018 Atrial fibrillation 72 bpm  Echocardiogram 05/27/2018 IMPRESSIONS   1. Study performed in atrial fibriallation.  2. The left ventricle has low normal systolic function, with an ejection fraction of 50-55%. The cavity size was normal. There is mildly increased left ventricular wall thickness. Left ventricular diastolic Doppler parameters are indeterminate.  3. Mild hypokinesis of the LV inferoseptum from midventricle to apex. Mild hypokinesis of the LV anteroseptum from base to apex.  4. Left atrial size was severely dilated.  5. Right atrial size was severely dilated.  6. The trivial pericardial effusion is posterior to the left ventricle.  7. The aortic valve was not well visualized. Aortic valve regurgitation is trivial  by color flow Doppler.  8. No intracardiac thrombi or masses were visualized. No intracardiac source of emboli was noted, howevere image quality was suboptimal.  9. When compared to the prior study: Side by side comparison of images performed to 02/01/2018. LV function has not significantly changed, and regional wall motion abnormalities appear unchanged. RVSP has decreased, however TR velocity may have been  incompletely assessed due to poor acoustic windows on today's exam. 10. The interatrial septum was not  well visualized.  FINDINGS  Left Ventricle: The left ventricle has low normal systolic function, with an ejection fraction of 50-55%. The cavity size was normal. There is mildly increased left ventricular wall thickness. Left ventricular diastolic Doppler parameters are  indeterminate. LV strain and biplane volumes performed but not reported due to interpreter judgement (atrial fibrillation, poor tracking). Right Ventricle: The right ventricle has mildly reduced systolic function. The cavity was not assessed. There is no increase in right ventricular wall thickness. Left Atrium: left atrial size was severely dilated  Assessment & Plan    Chest wall discomfort- has noticed 3 episodes of sharp chest pain today.  She describes the pain as sharp lasting for seconds and subsiding with rest.  Pain is located on the left side of her chest above her left breast.  She states the pain is nonexertional.  Her pain is reproduced through deep palpation and with movement.  She states that after her knee surgery she has been relying heavily on her walker and using her arms much more with ambulation.  .She did not take any nitroglycerin or other medications to help with pain.  She is status post TKA on 11/14/2018 with discharge home on 11/15/2018.  Postop Hgb 8.6 and HCT 29.7. Repeat CBC and BMP Reassured patient- pain muscular in nature encouraged her to continue to monitor.  Atrial fibrillation- EKG today atrial fibrillation 98 bpm Continue Eliquis 5 mg tablet twice daily  This patients CHA2DS2-VASc Score and unadjusted Ischemic Stroke Rate (% per year) is equal to 4.8 % stroke rate/year from a score of 4   Chronic diastolic congestive heart failure- echocardiogram 05/27/2018 showed an LVEF of 50 to 55% with mild LVH, intermediate left ventricular diastolic parameters.  Severely dilated left and right atria and trivial aortic valve regurgitation. Continue furosemide 20 mg tablet as needed for swelling  Morbid  obesity- failure of 3 gastric bypass procedures-weight today 249 pounds 12 ounces stable compared to 09/09/2018 appointment Continue weight loss  Pulmonary hypertension-PA pressure of 64 mmHg  Essential hypertension-BP today 144/66. Well-controlled at home Continue metoprolol succinate 25 mg twice daily Continue Lasix 20 mg tablet as needed for swelling Continue metoprolol succinate 50 mg daily  Disposition: Follow-up with Dr. [name] in 3 months.  Deberah Pelton, NP-C 11/20/2018, 4:55 PM

## 2018-11-21 LAB — CBC
Hematocrit: 23.4 % — ABNORMAL LOW (ref 34.0–46.6)
Hemoglobin: 7.5 g/dL — ABNORMAL LOW (ref 11.1–15.9)
MCH: 30.4 pg (ref 26.6–33.0)
MCHC: 32.1 g/dL (ref 31.5–35.7)
MCV: 95 fL (ref 79–97)
Platelets: 193 10*3/uL (ref 150–450)
RBC: 2.47 x10E6/uL — CL (ref 3.77–5.28)
RDW: 13 % (ref 11.7–15.4)
WBC: 6.8 10*3/uL (ref 3.4–10.8)

## 2018-11-21 LAB — BASIC METABOLIC PANEL
BUN/Creatinine Ratio: 22 (ref 12–28)
BUN: 21 mg/dL (ref 8–27)
CO2: 24 mmol/L (ref 20–29)
Calcium: 9 mg/dL (ref 8.7–10.3)
Chloride: 103 mmol/L (ref 96–106)
Creatinine, Ser: 0.96 mg/dL (ref 0.57–1.00)
GFR calc Af Amer: 71 mL/min/{1.73_m2} (ref >59)
GFR calc non Af Amer: 62 mL/min/{1.73_m2} (ref >59)
Glucose: 86 mg/dL (ref 65–99)
Potassium: 4.5 mmol/L (ref 3.5–5.2)
Sodium: 141 mmol/L (ref 134–144)

## 2018-11-22 ENCOUNTER — Other Ambulatory Visit: Payer: Self-pay

## 2018-11-22 ENCOUNTER — Telehealth: Payer: Self-pay

## 2018-11-22 ENCOUNTER — Other Ambulatory Visit (HOSPITAL_COMMUNITY): Payer: Self-pay | Admitting: Orthopedic Surgery

## 2018-11-22 ENCOUNTER — Ambulatory Visit (HOSPITAL_COMMUNITY)
Admission: RE | Admit: 2018-11-22 | Discharge: 2018-11-22 | Disposition: A | Discharge status: Home / Self Care | Payer: Medicare Other | Source: Ambulatory Visit | Attending: Orthopedic Surgery | Admitting: Orthopedic Surgery

## 2018-11-22 DIAGNOSIS — M7989 Other specified soft tissue disorders: Secondary | ICD-10-CM | POA: Insufficient documentation

## 2018-11-22 DIAGNOSIS — M79605 Pain in left leg: Secondary | ICD-10-CM | POA: Insufficient documentation

## 2018-11-22 NOTE — Telephone Encounter (Signed)
Received a call from the operator with a call from [facility] and she states that they are looking for the results from the LE VENOUS DVT US. She will tell them that these results take 24-48 hours to be read the operator will have her CB on Monday.  S/w vascular department she states that she has tried multiple times to contact ortho and they do not answer the phone to give them a verbal reading and they do not answer the phone. So she went ahead and faxed the written results to their office and receive a confirmation of the fax receipt.

## 2018-11-25 DIAGNOSIS — M25661 Stiffness of right knee, not elsewhere classified: Secondary | ICD-10-CM | POA: Diagnosis not present

## 2018-11-25 DIAGNOSIS — M25561 Pain in right knee: Secondary | ICD-10-CM | POA: Diagnosis not present

## 2018-11-27 ENCOUNTER — Emergency Department (HOSPITAL_COMMUNITY): Payer: Medicare Other

## 2018-11-27 ENCOUNTER — Other Ambulatory Visit: Payer: Self-pay

## 2018-11-27 ENCOUNTER — Emergency Department (HOSPITAL_COMMUNITY)
Admission: EM | Admit: 2018-11-27 | Discharge: 2018-11-27 | Disposition: A | Discharge status: Home / Self Care | Payer: Medicare Other | Attending: Emergency Medicine | Admitting: Emergency Medicine

## 2018-11-27 ENCOUNTER — Encounter (HOSPITAL_COMMUNITY): Payer: Self-pay | Admitting: Emergency Medicine

## 2018-11-27 DIAGNOSIS — N183 Chronic kidney disease, stage 3 unspecified: Secondary | ICD-10-CM | POA: Insufficient documentation

## 2018-11-27 DIAGNOSIS — I5032 Chronic diastolic (congestive) heart failure: Secondary | ICD-10-CM | POA: Diagnosis not present

## 2018-11-27 DIAGNOSIS — Z96652 Presence of left artificial knee joint: Secondary | ICD-10-CM | POA: Insufficient documentation

## 2018-11-27 DIAGNOSIS — Z7901 Long term (current) use of anticoagulants: Secondary | ICD-10-CM | POA: Insufficient documentation

## 2018-11-27 DIAGNOSIS — R0602 Shortness of breath: Secondary | ICD-10-CM | POA: Diagnosis present

## 2018-11-27 DIAGNOSIS — R0600 Dyspnea, unspecified: Secondary | ICD-10-CM

## 2018-11-27 DIAGNOSIS — I4891 Unspecified atrial fibrillation: Secondary | ICD-10-CM | POA: Insufficient documentation

## 2018-11-27 DIAGNOSIS — I130 Hypertensive heart and chronic kidney disease with heart failure and stage 1 through stage 4 chronic kidney disease, or unspecified chronic kidney disease: Secondary | ICD-10-CM | POA: Insufficient documentation

## 2018-11-27 DIAGNOSIS — E079 Disorder of thyroid, unspecified: Secondary | ICD-10-CM | POA: Insufficient documentation

## 2018-11-27 DIAGNOSIS — Z20828 Contact with and (suspected) exposure to other viral communicable diseases: Secondary | ICD-10-CM | POA: Diagnosis not present

## 2018-11-27 LAB — CBC WITH DIFFERENTIAL/PLATELET
Abs Immature Granulocytes: 0.03 10*3/uL (ref 0.00–0.07)
Basophils Absolute: 0.1 10*3/uL (ref 0.0–0.1)
Basophils Relative: 1 %
Eosinophils Absolute: 0.7 10*3/uL — ABNORMAL HIGH (ref 0.0–0.5)
Eosinophils Relative: 7 %
HCT: 30.8 % — ABNORMAL LOW (ref 36.0–46.0)
Hemoglobin: 9.2 g/dL — ABNORMAL LOW (ref 12.0–15.0)
Immature Granulocytes: 0 %
Lymphocytes Relative: 14 %
Lymphs Abs: 1.3 10*3/uL (ref 0.7–4.0)
MCH: 30.3 pg (ref 26.0–34.0)
MCHC: 29.9 g/dL — ABNORMAL LOW (ref 30.0–36.0)
MCV: 101.3 fL — ABNORMAL HIGH (ref 80.0–100.0)
Monocytes Absolute: 0.7 10*3/uL (ref 0.1–1.0)
Monocytes Relative: 8 %
Neutro Abs: 6.5 10*3/uL (ref 1.7–7.7)
Neutrophils Relative %: 70 %
Platelets: 308 10*3/uL (ref 150–400)
RBC: 3.04 MIL/uL — ABNORMAL LOW (ref 3.87–5.11)
RDW: 16.7 % — ABNORMAL HIGH (ref 11.5–15.5)
WBC: 9.2 10*3/uL (ref 4.0–10.5)
nRBC: 0 % (ref 0.0–0.2)

## 2018-11-27 LAB — BASIC METABOLIC PANEL
Anion gap: 12 (ref 5–15)
BUN: 19 mg/dL (ref 8–23)
CO2: 30 mmol/L (ref 22–32)
Calcium: 8 mg/dL — ABNORMAL LOW (ref 8.9–10.3)
Chloride: 100 mmol/L (ref 98–111)
Creatinine, Ser: 1.04 mg/dL — ABNORMAL HIGH (ref 0.44–1.00)
GFR calc Af Amer: >60 mL/min (ref >60)
GFR calc non Af Amer: 56 mL/min — ABNORMAL LOW (ref >60)
Glucose, Bld: 77 mg/dL (ref 70–99)
Potassium: 4 mmol/L (ref 3.5–5.1)
Sodium: 142 mmol/L (ref 135–145)

## 2018-11-27 LAB — TYPE AND SCREEN
ABO/RH(D): O POS
Antibody Screen: NEGATIVE

## 2018-11-27 LAB — BRAIN NATRIURETIC PEPTIDE: B Natriuretic Peptide: 239.3 pg/mL — ABNORMAL HIGH (ref 0.0–100.0)

## 2018-11-27 NOTE — ED Notes (Signed)
ED Provider at bedside. 

## 2018-11-27 NOTE — ED Triage Notes (Signed)
Pt reports had left knee surgery. Was having SOB last week. Had scan to rule out DVT. Reports was called today and advised to go to ED for her low/abnoral lab work from last week. Pt pulled up labs and Hgb 7.5. pt is taking Eliquis.

## 2018-11-28 ENCOUNTER — Telehealth: Payer: Self-pay | Admitting: *Deleted

## 2018-11-28 LAB — SARS CORONAVIRUS 2 (TAT 6-24 HRS): SARS Coronavirus 2: NEGATIVE

## 2018-11-28 NOTE — Telephone Encounter (Signed)
   [facility] Pre-operative Risk Assessment    Request for surgical clearance:  1. What type of surgery is being performed? RIGHT TOTAL KNEE ARTHROPLASTY    2. When is this surgery scheduled? 02/18/19   3. What type of clearance is required (medical clearance vs. Pharmacy clearance to hold med vs. Both)? BOTH   4. Are there any medications that need to be held prior to surgery and how long? ELIQUIS    5. Practice name and name of physician performing surgery? [facility]    6. What is your office phone number? [phone]    7.   What is your office fax number? [place]   Anesthesia type (None, local, MAC, general) ? SPINAL

## 2018-11-29 ENCOUNTER — Telehealth: Payer: Self-pay | Admitting: Internal Medicine

## 2018-11-29 DIAGNOSIS — E8770 Fluid overload, unspecified: Secondary | ICD-10-CM | POA: Diagnosis not present

## 2018-11-29 DIAGNOSIS — E890 Postprocedural hypothyroidism: Secondary | ICD-10-CM | POA: Diagnosis not present

## 2018-11-29 DIAGNOSIS — D509 Iron deficiency anemia, unspecified: Secondary | ICD-10-CM | POA: Diagnosis not present

## 2018-11-29 DIAGNOSIS — I10 Essential (primary) hypertension: Secondary | ICD-10-CM | POA: Diagnosis not present

## 2018-11-29 DIAGNOSIS — R0609 Other forms of dyspnea: Secondary | ICD-10-CM | POA: Diagnosis not present

## 2018-11-29 DIAGNOSIS — D519 Vitamin B12 deficiency anemia, unspecified: Secondary | ICD-10-CM | POA: Diagnosis not present

## 2018-11-29 NOTE — Telephone Encounter (Signed)
Pt takes Eliquis for afib with CHADS2VASc score of 4 (age, sex, CHF, HTN). Pt had lower extremity doppler last week, no evidence of DVT. Previous left TKA done on 11/14/18, current request for right TKA. Renal function is normal. Ok to hold Eliquis for 3 days prior to TKA.

## 2018-11-29 NOTE — Telephone Encounter (Signed)
Fax received from PCP requesting MD evaluation, suggesting patient needs echo for pericardial effusion seen in ED visit imaging study.   Advised will notify MD to determine if echo needs to be ordered and/or if she needs follow up

## 2018-11-29 NOTE — Telephone Encounter (Signed)
CXR says "enlarged cardiac silhoutte", no clear diagnosis of pericardial effusion. Probably needs return appointment to evaluate for this.  Dr Lemmie Evens

## 2018-11-29 NOTE — ED Provider Notes (Signed)
[facility] DEPT Provider Note   CSN: [id number] Arrival date & time: 11/27/18  1020     History   Chief Complaint Chief Complaint  Patient presents with  . Shortness of Breath  . Abdominal Pain    HPI Jillian Eaton is a 68 y.o. female.     HPI   68 year old female with dyspnea.  Worsening the past week.  Recent left knee replacement.  Increasingly anemic without surgery.  Receiving IV transfusion.  Hemoglobin last week is 7.5.  She had an ultrasound rule out DVT.  She is also on Eliquis for history of atrial fibrillation.  No fevers or chills.  No cough.  Some swelling at her surgical site but reports that is been overall been improving.  Past Medical History:  Diagnosis Date  . Anemia   . Asthma    flare up last yr  . CHF (congestive heart failure) ([place])   . Chronic headache    " better now "   . DOE (dyspnea on exertion)    2D ECHO, 02/12/2012 - EF 60-65%, moderate concentric hypertrophy  . Fibromyalgia    nerve pain"left side at waist level" "can't lay on that side without pain" , "HOB elevation helps"; 9-17- since i lost the wieght , i dont  think i have this anymore   . Heart murmur   . Hematuria - cause not known    resolved   . History of kidney stones    x 2 '13, '14 surgery to remove  . Hypertension   . SBO (small bowel obstruction) ([place]) 06/07/2013  . SBO (small bowel obstruction) ([place])    rqueired admission 2020  . Sleep apnea    tested several times and came up negative  . Thyroid disease    "goiter"  . Transfusion history    10 yrs+    Patient Active Problem List   Diagnosis Date Noted  . S/P left TKA 11/14/2018  . Status post total left knee replacement 11/14/2018  . Degenerative joint disease involving multiple joints on both sides of body 07/31/2018  . Rotator cuff tear arthropathy of right shoulder 04/05/2018  . Overactive bladder 04/05/2018  . Steroid-induced hyperglycemia   . Generalized OA   .  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 NICM (nonischemic cardiomyopathy) ([place]) 01/15/2018  . Osteoporosis 12/13/2017  . Rotator cuff tear arthropathy 11/29/2017  . Chronic diastolic CHF (congestive heart failure) ([place]) 09/28/2017  . CKD (chronic kidney disease) stage 3, GFR 30-59 ml/min 09/07/2017  . Hypomagnesemia with secondary hypocalcemia 09/07/2017  . 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 Hypercapnemia 07/13/2017  . AKI (acute kidney injury) ([place]) 07/10/2017  . Vertigo 07/08/2017  . Blurring of visual image 07/08/2017  . On anticoagulant therapy 06/19/2017  . Chronic respiratory failure with hypoxia ([place]) 04/19/2017  . GERD (gastroesophageal reflux disease) 08/28/2016  . 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 Depressive disorder 08/28/2016  . Iatrogenic hypocalcemia 08/28/2016  . Chronic pain syndrome   . Acute diastolic (congestive) heart failure ([place])   . Oropharyngeal dysphagia   . Chronic atrial fibrillation ([place]) 04/28/2016  . Vocal cord dysfunction 04/28/2016  . Thrombocytopenia ([place]) 04/28/2016  . Seizures ([place])   . Preoperative cardiovascular examination   . 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  . Leg swelling 10/19/2015  . Acute on chronic respiratory failure with hypoxia and hypercapnia ([place]) 08/03/2015  . Bilateral leg edema 05/11/2015  . Essential hypertension 05/11/2015  . Nephrolithiasis 04/12/2015  . Numbness  in both hands 04/12/2015  . Complete tear of left rotator cuff 11/19/2014  . Primary osteoarthritis of both knees 03/24/2014  . Chronic asthmatic bronchitis ([place]) 07/30/2013  . Bariatric surgery status 07/30/2013  . 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 Urge incontinence of urine 07/30/2013  . Vitamin D deficiency 07/30/2013  . B-complex deficiency 07/30/2013  . Cough 07/30/2013  . Gross hematuria 07/30/2013  . Goiter 07/30/2013  . Palpitations 07/30/2013  . Right flank pain 07/30/2013  . SBO (small bowel  obstruction) ([place]) 06/07/2013  . Pulmonary hypertension ([place]) 01/08/2013  . Insomnia 03/22/2012  . Mild intermittent asthma with acute exacerbation 03/22/2012  . Fibromyalgia 10/26/2011  . Anemia of chronic disease 03/28/2011  . Morbid obesity with body mass index of 40.0-49.9 ([place]) 09/22/2010    Past Surgical History:  Procedure Laterality Date  . ABDOMINAL ADHESION SURGERY  2004   open LOA - in [place]  . CARDIAC CATHETERIZATION  04/04/2010   No significant obstructive coronary artery disease  . CARDIOVERSION N/A 08/08/2017   Procedure: CARDIOVERSION;  Surgeon: Pixie Casino, MD;  Location: [facility];  Service: Cardiovascular;  Laterality: N/A;  . CHOLECYSTECTOMY  1990  . COLONOSCOPY W/ POLYPECTOMY    . COLONOSCOPY WITH PROPOFOL N/A 04/10/2014   Procedure: COLONOSCOPY WITH PROPOFOL;  Surgeon: Beryle Beams, MD;  Location: [facility];  Service: Endoscopy;  Laterality: N/A;  . EYE SURGERY     lasik 20-25 yrs ago  . GASTROPLASTY VERTICAL BANDED  1985  . [place]   In [place] for SBO  . REVERSE SHOULDER ARTHROPLASTY Right 03/29/2018   Procedure: REVERSE SHOULDER ARTHROPLASTY;  Surgeon: Netta Cedars, MD;  Location: [facility];  Service: Orthopedics;  Laterality: Right;  . ROUX-EN-Y GASTRIC BYPASS  1992   Conversion VBG to RnYGB in [place], [place]  . SHOULDER ARTHROSCOPY WITH SUBACROMIAL DECOMPRESSION  2016   Dr Berenice Primas, [place], [place]  . SMALL INTESTINE SURGERY  2000   SBO - LOA w SB resection  . TOTAL KNEE ARTHROPLASTY Left 11/14/2018   Procedure: TOTAL KNEE ARTHROPLASTY;  Surgeon: Paralee Cancel, MD;  Location: [facility];  Service: Orthopedics;  Laterality: Left;  70 mins  . TOTAL THYROIDECTOMY  06/26/2012   Dr Celine Ahr, [facility]  . TUBAL LIGATION  1986  . URETHRAL DILATION  2012   w cystoscopy.  Dr Gaynelle Arabian     OB History   No obstetric history on file.      Home Medications    Prior to Admission medications   Medication Sig  Start Date End Date Taking? Authorizing Provider  apixaban (ELIQUIS) 5 MG TABS tablet Take 1 tablet (5 mg total) by mouth 2 (two) times daily. 04/05/18   Hennie Duos, MD  calcitRIOL (ROCALTROL) 0.5 MCG capsule TAKE 1 CAPSULE BY MOUTH TWICE A DAY 10/10/18   Philemon Kingdom, MD  calcium carbonate (OS-CAL - DOSED IN MG OF ELEMENTAL CALCIUM) 1250 (500 Ca) MG tablet Take 2 tablets (1,000 mg of elemental calcium total) by mouth 2 (two) times daily. Patient taking differently: Take 2 tablets by mouth 2 (two) times daily with a meal.  04/05/18   Hennie Duos, MD  clotrimazole (LOTRIMIN) 1 % cream APPLY TO AFFECTED AREA TWICE A DAY 11/18/18   Marzetta Board, DPM  cyanocobalamin (,VITAMIN B-12,) 1000 MCG/ML injection Inject 1 mL (1,000 mcg total) into the muscle every 30 (thirty) days. 04/05/18   Hennie Duos, MD  cyclobenzaprine (FLEXERIL) 10 MG tablet Take 2  tablets (20 mg total) by mouth 2 (two) times daily. 04/05/18   Hennie Duos, MD  docusate sodium (COLACE) 100 MG capsule Take 1 capsule (100 mg total) by mouth 2 (two) times daily. 11/15/18   Danae Orleans, PA-C  DULoxetine (CYMBALTA) 60 MG capsule Take 1 capsule (60 mg total) by mouth daily. 04/05/18   Hennie Duos, MD  eszopiclone (LUNESTA) 2 MG TABS tablet Take 1 tablet (2 mg total) by mouth at bedtime. 04/05/18   Hennie Duos, MD  FERREX 150 150 MG capsule Take 150 mg by mouth 2 (two) times daily. 05/01/18   [provider]  fluorometholone (FML) 0.1 % ophthalmic suspension Place 1 drop into both eyes 4 (four) times daily. 10/29/18   [provider]  furosemide (LASIX) 20 MG tablet Take 1 tablet (20 mg total) by mouth daily as needed (swelling). 06/04/18 09/02/18  Hilty, Nadean Corwin, MD  HYDROcodone-acetaminophen (NORCO) 7.5-325 MG tablet Take 1-2 tablets by mouth every 4 (four) hours as needed for moderate pain. 11/15/18   Danae Orleans, PA-C  levETIRAcetam (KEPPRA) 500 MG tablet Take 1 tablet (500 mg total)  by mouth 2 (two) times daily. 10/02/18   Suzzanne Cloud, NP  levothyroxine (SYNTHROID) 200 MCG tablet TAKE 1 TABLET BY MOUTH EVERY DAY BEFORE BREAKFAST Patient taking differently: Take 200 mcg by mouth daily before breakfast.  06/14/18   Hilty, Nadean Corwin, MD  magnesium oxide (MAG-OX) 400 MG tablet Take 1 tablet (400 mg total) by mouth daily. 04/05/18   Hennie Duos, MD  metoprolol succinate (TOPROL-XL) 25 MG 24 hr tablet TAKE 1 TABLET BY MOUTH IN AM AND TAKE YOUR 50 MG TABLETS IN THE EVENING TAKE WITH FOOD Patient taking differently: Take 25 mg by mouth See admin instructions. TAKE 1 TABLET BY MOUTH IN AM AND TAKE YOUR 25 MG TABLETS IN THE EVENING TAKE WITH FOOD.  Takes 50 total 06/27/18   Hilty, Nadean Corwin, MD  mirabegron ER (MYRBETRIQ) 50 MG TB24 tablet Take 1 tablet (50 mg total) by mouth daily. 04/05/18   Hennie Duos, MD  NONFORMULARY OR COMPOUNDED ITEM [facility]:  Antifungal Cream - Terbinafine 3%, Fluconazole 2%, Tea Tree Oil 5%, Urea 10%, Ibuprofen 2% in DMSO Suspension #79ml. Apply to affected toenail(s) once daily (at bedtime) or twice daily. 05/07/18   Marzetta Board, DPM  polyethylene glycol (MIRALAX / GLYCOLAX) 17 g packet Take 17 g by mouth 2 (two) times daily. 11/15/18   Danae Orleans, PA-C  traZODone (DESYREL) 50 MG tablet Take 1 tablet (50 mg total) by mouth at bedtime. 04/05/18   Hennie Duos, MD  Vitamin D, Ergocalciferol, (DRISDOL) 1.25 MG (50000 UT) CAPS capsule Take 1 capsule (50,000 Units total) by mouth every Wednesday. 04/10/18   Hennie Duos, MD    Family History Family History  Problem Relation Age of Onset  . Diabetes Mother   . Epilepsy Mother   . Cancer Mother        Breast  . Hypertension Mother   . Breast cancer Mother   . Seizures Mother   . Kidney disease Father   . Diabetes Father   . Hypertension Father   . Asthma Father   . Heart disease Father   . Epilepsy Sister   . Cancer Maternal Grandmother   . Breast cancer Maternal  Grandmother   . Cancer Paternal Grandmother   . Breast cancer Paternal Grandmother     Social History Social History   Tobacco Use  .  Smoking status: Never Smoker  . Smokeless tobacco: Never Used  Substance Use Topics  . Alcohol use: No    Comment: wine occ  . Drug use: No    Types: Oxycodone    Comment: perscribed     Allergies   Hydrocodone   Review of Systems Review of Systems All systems reviewed and negative, other than as noted in HPI.   Physical Exam Updated Vital Signs BP (!) 98/55   Pulse 89   Temp 98.5 F (36.9 C) (Oral)   Resp 15   SpO2 93%   Physical Exam Vitals signs and nursing note reviewed.  Constitutional:      General: She is not in acute distress.    Appearance: She is well-developed. She is obese.  HENT:     Head: Normocephalic and atraumatic.  Eyes:     General:        Right eye: No discharge.        Left eye: No discharge.     Conjunctiva/sclera: Conjunctivae normal.  Neck:     Musculoskeletal: Neck supple.  Cardiovascular:     Rate and Rhythm: Normal rate and regular rhythm.     Heart sounds: Normal heart sounds. No murmur. No friction rub. No gallop.   Pulmonary:     Effort: Pulmonary effort is normal. No respiratory distress.     Breath sounds: Normal breath sounds.  Abdominal:     General: There is no distension.     Palpations: Abdomen is soft.     Tenderness: There is no abdominal tenderness.  Musculoskeletal:        General: No tenderness.     Comments: Left knee appears to be healing without complication.  Skin:    General: Skin is warm and dry.  Neurological:     Mental Status: She is alert.  Psychiatric:        Behavior: Behavior normal.        Thought Content: Thought content normal.      ED Treatments / Results  Labs (all labs ordered are listed, but only abnormal results are displayed) Labs Reviewed  CBC WITH DIFFERENTIAL/PLATELET - Abnormal; Notable for the following components:      Result Value    RBC 3.04 (*)    Hemoglobin 9.2 (*)    HCT 30.8 (*)    MCV 101.3 (*)    MCHC 29.9 (*)    RDW 16.7 (*)    Eosinophils Absolute 0.7 (*)    All other components within normal limits  BASIC METABOLIC PANEL - Abnormal; Notable for the following components:   Creatinine, Ser 1.04 (*)    Calcium 8.0 (*)    GFR calc non Af Amer 56 (*)    All other components within normal limits  BRAIN NATRIURETIC PEPTIDE - Abnormal; Notable for the following components:   B Natriuretic Peptide 239.3 (*)    All other components within normal limits  SARS CORONAVIRUS 2 (TAT 6-24 HRS)  TYPE AND SCREEN    EKG EKG Interpretation  Date/Time:  Wednesday November 27 2018 12:13:02 EDT Ventricular Rate:  78 PR Interval:    QRS Duration: 102 QT Interval:  402 QTC Calculation: 458 R Axis:   82 Text Interpretation:  Atrial fibrillation Borderline right axis deviation Low voltage, precordial leads Borderline T abnormalities, anterior leads Confirmed by Virgel Manifold [phone]) on 11/27/2018 12:39:19 PM Also confirmed by Virgel Manifold [phone]), editor Hattie Perch ([id number])  on 11/28/2018 2:09:24 PM   Radiology Dg Chest  Portable 1 View  Result Date: 11/27/2018 CLINICAL DATA:  Dyspnea EXAM: PORTABLE CHEST 1 VIEW COMPARISON:  Portable exam 1159 hours compared to 08/03/2018 FINDINGS: Enlarged cardiac silhouette, cannot exclude pericardial effusion with this configuration. Mediastinal contours and pulmonary vascularity normal. Lungs clear. No acute infiltrate, pleural effusion or pneumothorax. Bones demineralized with note of a RIGHT shoulder prosthesis. IMPRESSION: Enlargement of cardiac silhouette, cannot exclude pericardial effusion with this appearance. No acute abnormalities. Electronically Signed   By: Lavonia Dana M.D.   On: 11/27/2018 12:56    Procedures Procedures (including critical care time)  Medications Ordered in ED Medications - No data to display   Initial Impression / Assessment and Plan / ED  Course  I have reviewed the triage vital signs and the nursing notes.  Pertinent labs & imaging results that were available during my care of the patient were reviewed by me and considered in my medical decision making (see chart for details).        68 year old female with dyspnea.  She is anemic, but this is improved from recent labs.  Recent ultrasound negative for DVT.  She is on Eliquis.  Consider PE, but doubt.  May be some element of deconditioning given her recent surgery and decreased activity.  Mild elevation in BNP.  No overt edema on x-ray.  Oxygen saturations are good on room air.  Final Clinical Impressions(s) / ED Diagnoses   Final diagnoses:  Dyspnea, unspecified type    ED Discharge Orders    None       Virgel Manifold, MD 11/29/18 [phone]

## 2018-11-29 NOTE — Telephone Encounter (Signed)
Follow Up:     Pt is returning a call from today, she does not know who called. I asked it was it concerning her surgery, she said she did not know, since that was not scheduled until December.

## 2018-12-02 DIAGNOSIS — M25661 Stiffness of right knee, not elsewhere classified: Secondary | ICD-10-CM | POA: Diagnosis not present

## 2018-12-02 DIAGNOSIS — M25561 Pain in right knee: Secondary | ICD-10-CM | POA: Diagnosis not present

## 2018-12-02 NOTE — Telephone Encounter (Signed)
   Primary Cardiologist:Kenneth C Hilty, MD  Chart reviewed as part of pre-operative protocol coverage. Because of Jillian Eaton's past medical history and time since last visit, he/she will require a follow-up visit in order to better assess preoperative cardiovascular risk.  Pt was recently seen 09/30 however with her symptoms, she will need a follow up appointment prior to her surgery that is scheduled 01/2019  Pre-op covering staff: - Please schedule appointment and call patient to inform them. - Please contact requesting surgeon's office via preferred method (i.e, phone, fax) to inform them of need for appointment prior to surgery.  If applicable, this message will also be routed to pharmacy pool and/or primary cardiologist for input on holding anticoagulant/antiplatelet agent as requested below so that this information is available at time of patient's appointment.   Kathyrn Drown, NP  12/02/2018, 12:35 PM

## 2018-12-02 NOTE — Telephone Encounter (Signed)
Called to schedule patient for a follow up visit---could not reach patient---no voice mail.  Will send My Chart message to have patient call and schedule.

## 2018-12-03 DIAGNOSIS — D509 Iron deficiency anemia, unspecified: Secondary | ICD-10-CM | POA: Diagnosis not present

## 2018-12-03 DIAGNOSIS — E890 Postprocedural hypothyroidism: Secondary | ICD-10-CM | POA: Diagnosis not present

## 2018-12-03 DIAGNOSIS — D519 Vitamin B12 deficiency anemia, unspecified: Secondary | ICD-10-CM | POA: Diagnosis not present

## 2018-12-03 NOTE — Telephone Encounter (Signed)
Patient is already scheduled to see Dr. [name] on 10/15 at 9:45AM added to the note section for that visit pre-op clearance.

## 2018-12-04 DIAGNOSIS — M25661 Stiffness of right knee, not elsewhere classified: Secondary | ICD-10-CM | POA: Diagnosis not present

## 2018-12-04 DIAGNOSIS — M25561 Pain in right knee: Secondary | ICD-10-CM | POA: Diagnosis not present

## 2018-12-04 NOTE — Telephone Encounter (Signed)
Patient has appointment 12/05/2018

## 2018-12-05 ENCOUNTER — Ambulatory Visit (INDEPENDENT_AMBULATORY_CARE_PROVIDER_SITE_OTHER): Payer: Medicare Other | Admitting: Internal Medicine

## 2018-12-05 ENCOUNTER — Encounter: Payer: Self-pay | Admitting: Internal Medicine

## 2018-12-05 ENCOUNTER — Other Ambulatory Visit: Payer: Self-pay

## 2018-12-05 VITALS — BP 129/88 | HR 92 | Temp 93.9°F | Ht 66.5 in | Wt 249.8 lb

## 2018-12-05 DIAGNOSIS — I428 Other cardiomyopathies: Secondary | ICD-10-CM | POA: Diagnosis not present

## 2018-12-05 DIAGNOSIS — R0602 Shortness of breath: Secondary | ICD-10-CM | POA: Diagnosis not present

## 2018-12-05 DIAGNOSIS — I4819 Other persistent atrial fibrillation: Secondary | ICD-10-CM | POA: Diagnosis not present

## 2018-12-05 DIAGNOSIS — I639 Cerebral infarction, unspecified: Secondary | ICD-10-CM | POA: Diagnosis not present

## 2018-12-05 DIAGNOSIS — R0600 Dyspnea, unspecified: Secondary | ICD-10-CM

## 2018-12-05 NOTE — Patient Instructions (Signed)
Medication Instructions:  Your physician recommends that you continue on your current medications as directed. Please refer to the Current Medication list given to you today.   Lab Work: None  Testing/Procedures: Echo @ [address] 3rd Floor  Follow-Up: At [facility], you and your health needs are our priority.  As part of our continuing mission to provide you with exceptional heart care, we have created designated Provider Care Teams.  These Care Teams include your primary Cardiologist (physician) and Advanced Practice Providers (APPs -  Physician Assistants and Nurse Practitioners) who all work together to provide you with the care you need, when you need it.  Your next appointment:   After echocardiogram (in about 1 month)  The format for your next appointment:   In Person  Provider:   You may see Pixie Casino, MD or one of the following Advanced Practice Providers on your designated Care Team:    Almyra Deforest, PA-C  Fabian Sharp, PA-C or   Roby Lofts, [place]   Other Instructions

## 2018-12-05 NOTE — Progress Notes (Signed)
OFFICE NOTE  Chief Complaint:  Follow-up ER for dyspnea  Primary Care Physician: Jillian Hose, MD  HPI:  Jillian Eaton is a 68 year old female I saw a few weeks ago with a history of super morbid obesity, fibromyalgia and increasing shortness of breath. She had a cath in 2012 which showed normal coronaries, mildly elevating filling pressures, and diastolic pressures with a mean PA of 31. This correlated with her echocardiogram when RV systolic pressures in 0000000 were 49 on echo. A repeat echocardiogram was just performed which demonstrated a preserved LVEF of 60-65% with moderate concentric LVH. There was mild to moderate increase in pulmonary artery pressure with peak at 51, which has not significantly changed from her study 1 year ago. Although the pressures look very similar her shortness of breath has been increasing significantly, and I recommended that she wear oxygen at night since she had that at home which does seem to be helping her at least feel better during the day as I suspect she has sleep apnea. She has been tested before which was apparently negative and is considering retesting at some point in the future. Overall I think the main issue obviously is weight, and she unfortunately says that she is not a candidate for gastric bypass and therefore it is a very difficult situation.  I referred her back to her pulmonologist in [facility] for ongoing evaluation of pulmonary hypertension. She tells me that she was then referred to [facility] and saw a specialist there who did another right heart catheterization but did not recommend any medications other than better blood pressure control.   In addition she had problems with kidney stones and underwent 2 operations regarding tthis.  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  She is now dependent on thyroid medication. Unfortunately she's not been able to lose weight, but her weight is  fairly stable around 400 pounds as is her shortness of breath.  Cath in 2012:  LEFT HEART CATHETERIZATION  OPERATOR: Jillian Arohi Salvatierra, MD, and Jillian Porter, MD  INDICATION: Dyspnea on exertion and chest pressure.  HISTORY OF PRESENT ILLNESS: Jillian Eaton is a morbidly obese (BMI greater than 4) female with a history of failed gastric bypass x2 with an increasing weight gain and increasing shortness of breath as well as new- onset dyspnea on exertion. She reports that she can only walk about 10 feet before she becomes short of breath and has chest pressure which she says get better with rest. She has numerous cardiac risk factors and given the high likelihood of false positive stress test, I have referred her for cardiac catheterization, both left and right heart as she has had elevated RVSP on echocardiogram of approximately 30 to 31 mmHg.  PROCEDURE: The patient was brought into the cardiac catheterization lab, sterilely prepped and draped in the usual fashion. The area around the right femoral artery and right brachial vein were cleansed and draped to allow an attempt at radial arterial and brachiocephalic venous access. IV was not able to be obtained prior to the procedure given her body habitus. With difficulty in assessing the vein, the ultrasound was eventually used to identify the right brachiocephalic vein, however, cannulation with needle and wire was not possible as the vein was very small in caliber. After mild local bleeding was controlled, we did turn our attention to the right femoral vein and with great difficulty using the ultrasound, the right femoral vein was accessed  by Jillian Eaton using a straight wire and a needle. After venous access was obtained, the attention was turned to the right radial artery by Jillian Eaton and simultaneous to that right heart catheterization was performed by myself without any difficulty. The right radial artery was  successfully cannulated and subsequently left coronary artery system was selectively injected with a 5-French TIG 4.0 catheter, however the right coronary artery could not be cannulated with the TIG catheter and was eventually cannulated with a JR-4 catheter. LV pressure was measured with a pigtail catheter. Estimated blood loss was about 30 mL. There were no acute complications. The patient received a total of 9 mg of Versed throughout the procedure as well as 225 mcg of fentanyl and was at no point greater than moderately sedated. She received 5000 units of heparin about 10 mL of a radial cocktail.  FINDINGS: 1. Left main - short, no disease. 2. LAD - no significant disease. 3. Left circumflex, no significant disease. 4. RCA - dominant, no disease, large-caliber vessel. 5. LVEDP = 20 mmHg. 6. RA - 12. 7. RV 38/12. 8. PA - 43/19 (31). 9. PCWP - 24. 10.TPG - 7. 11.Fick cardiac output/Fick cardiac index - 10.56/3.84. 12.Thermodilution cardiac output/thermodilution cardiac index -  6.78/2.47. 13.Aortic saturation - 94%. 14.PA saturation - 68%.  IMPRESSION: 1. No significant obstructive coronary artery disease. 2. LVEDP = 20 mmHg. 3. Borderline pulmonary venous hypertension. 4. High cardiac output.  Jillian Eaton returns today for follow-up. She reports that her shortness of breath has not significantly worsened, in fact, possibly is slightly better. She did have thyroid surgery last [facility] and apparently underwent right heart catheterization prior to that. I do not have those records immediately available. Unfortunately she continues to maintain her weight and has not been able to lose any. She is complaining of some numbness and tingling in her feet which is likely neuropathy. She is on medication including Lyrica which she takes for fibromyalgia.  Jillian Eaton returns today for follow-up. She recently is been having more shortness of breath  and lower extremity swelling. She pointed out edema in her legs with very small blisters and some chronic venous stasis changes. She is not currently on a diuretic. She recently saw another new primary care provider with the [facility]. She was noted to be started on lisinopril 40 mg daily, which she is taking in addition to losartan 100 mg daily. The notes do not indicate from her office visit why she was started on lisinopril, but I can see through care everywhere that this was ordered by her primary care provider. She also takes amlodipine for blood pressure control. Her blood pressure was elevated initially at 178/94, but after resting came down to 120/78 and is at goal today. It is unusual, however to use both ACE inhibitors and ARBs.  07/02/2015  Mrs. Sian returns today for follow-up. She underwent a repeat echocardiogram for progressive dyspnea and exertion which showed a preserved LVEF of 6065% however there is moderate to severe pulmonary hypertension with an RVSP of 64 mmHg. This is increased about 10 mmHg compared to her prior study. Her mean pulmonary pressure by catheterization in 2012 was only 31 mmHg. I suspect that progressive pulmonary hypertension as a cause of her worsening shortness of breath. In fact, during recent hospitalization for surgery she required discharged with oxygen. She says she rarely uses oxygen at home but notes that she is hypoxic often when she checks  her oxygen levels. At her last office visit I recommended Lasix which she has been using sparingly. She has problems with incontinence and she does report an improvement in her swelling with it but does not notice significant change in her shortness of breath.  06/06/2016  Mrs. Pothier returns from hospital follow-up. She was just admitted after an admission in January for unintentional narcotic overdose. She had unresponsiveness and possible aspiration. There was a second admission for a similar presentation  with respiratory failure, fever, sepsis and ultimately she was found to have a new onset cardiomyopathy with EF as low as 25%. Was felt that this was nonischemic. She was having intermittent atrial tachycardia and was felt that this might need to be managed with antiarrhythmic therapy however it seems to have resolved with carvedilol. She was placed on diuretics and reports that her breathing is close to baseline. She remains hypoxic with an O2 saturation 93% however was on home oxygen is result of her severe pulmonary hypertension. She does not feel that she needs to use the oxygen very regularly. From a heart failure standpoint she is on carvedilol, aspirin, furosemide and not currently on an ACE inhibitor, ARB or Entresto. Recent testing a renal function shows normal creatinine.  10/11/2016  Mrs. Smet is seen today in follow-up. In July she was hospitalized for hyperkalemia. She is on supplemental potassium and lisinopril, both were stopped. Fortunately her echo has improved back to normal recently. Overall she feels well. Her weight is down about 40 pounds of the recently she gained about 20 pounds back. She is working on that right as we speak.  04/03/2017  Mrs. Heinsohn was seen today in follow-up.  She recently called in and had complaints of worsening shortness of breath.  She saw her pulmonary doctor who did an x-ray noted a mild right upper lobe infiltrate versus edema.  Labs were obtained including BNP which was only mildly elevated at 110.  She was given 20 mg of Lasix with no benefit and then recently was advised over the phone to increase her Lasix to 40 mg for 2 days.  She urinated quite a bit and lost about 5 pounds of what she feels was water weight.  She reports mild improvement in her breathing.  She is quite anxious about what might have led to this fluid gain.  Given her history of cardiomyopathy, it is possible she could have had some recurrent cardiomyopathy.  She reports stable diet and  no worsening abuse of sodium.  06/19/2017  Mrs. Lagrow returns today for follow-up.  She was supposed to have a limited echocardiogram but that never happened.  Approximately 2 to 3 weeks ago she called the office reporting palpitations.  We try to schedule an earlier appointment, but she ended up going to [facility] for her birthday.  On the flight she apparently became short of breath had some left flank pain and was hypoxic.  She was given oxygen and after landing they took her to the [facility] in [place].  She was treated there and found to be in A. fib with RVR.  This is a new finding.  She had a remote history of either PAT or PAF, but so brief that she was not anticoagulated.  She was then started on Xarelto.  She tells me she was given Lasix and diuresed.  She returns today for follow-up and still reports some shortness of breath.  Weight is about 6 pounds heavier than she was 2  months ago.  She is in A. fib persistently with heart rates in the low 100s.  In addition she reports after starting the Xarelto that she has been having some vaginal bleeding.  07/26/2017  Mrs. Egnor returns today for follow-up.  She was recently seen in the hospital and discharged on 07/17/2017.  Today is a transition of care follow-up.  She was contacted on the day after discharge and felt to be doing well.  She is currently in rehabilitation.  She says she has had marked improvement in her shortness of breath.  She feels like her edema has improved.  She has decreased her dose of daily Lasix from 80 mg to 40 mg.  She is in persistent atrial fibrillation however rate controlled on amiodarone 400 mg which she is taking twice daily.  She reports she is getting stronger and hopefully will be out of rehabilitation within the next week.  She did miss 1 dose of Eliquis due to a transfer issue on May 28 and therefore will need 3 weeks of subsequent uninterrupted anticoagulation prior to attempted  cardioversion.  09/13/2017  Mrs. Hausmann returns today for follow-up of cardioversion.  Unfortunately this was unsuccessful after 3 shocks.  She told me there is some confusion afterwards that she forgot to take her amiodarone.  She was taking it up until the cardioversion.  Since it was unsuccessful, I recommended rate control strategy.  At this point there is little benefit from being on amiodarone and the side effect profile would not make it favorable.  I would recommend just continuing carvedilol for rate control.  She is had significant weight loss now down to 313 from 340.  This will continue to help her symptoms.  Her shortness of breath has improved.  She has had no significant worsening edema.  No further significant GU bleeding.  09/28/2017  Mrs. Stefan returns today for follow-up of multiple ER visits.  I last saw her a couple weeks ago and unfortunately she was maintaining A. fib after unsuccessful cardioversion.  She continues to lose weight.  In fact she is down to 306 pounds today from 313.  Lab work indicates a rising creatinine and very low BNP, which makes me feel that she may be over diuresed.  She is also noted to be anemic with iron deficiency and is anticoagulated on Eliquis.  Her PCP is likely to start her on iron.  She denies any blood in the stool however stool guaiacs or work-up for microscopic iron is indicated.  She reports intermittent hematuria which is been minor.  Blood pressure is noted to be low today 94/69.  She is on low-dose carvedilol for rate control as well as hydrochlorothiazide and low-dose Lasix.  Recently she said she has had improvement in her breathing and for the first time the other day was able to sleep without oxygen.  10/12/2017  Mrs. Roussell was seen again today in follow-up.  She called the office that she noted to have significant weight gain.  In July she was 313 and had lost weight down to 297.  Subsequently was found out that she was possibly overtreated  with her thyroid medication and her dose was decreased from 300 mcg to 200 mcg daily by her endocrinologist.  Since then she has gained weight but she has been up 12 pounds over the past 2 weeks.  She does report some edema.  I had stopped her hydrochlorothiazide due to hypotension and blood pressure is better today 112/70.  She takes low-dose Lasix 20 mg daily.  She also reported that she now has stage III chronic kidney disease.  12/13/2017  Mrs. Etcheverry returns today for follow-up.  She is done well and felt very stable.  Although her weight is up about 10 pounds she denies any worsening fluid.  She remains on 20 mg Lasix daily.  She is been working with her endocrinologist who reduced her levothyroxine.  Some of the weight gain may be related to that as she was on a very high dose of 300 mcg daily.  She denies any worsening shortness of breath.  She is having problems with her right shoulder and is scheduled to go arthroplasty by Dr. Alma Friendly.  She is here today for preoperative risk assessment.  She denies any chest pain.  She is on Eliquis for A. fib which is persistent but rate control.  She would have to stop this typically about 3 days prior to the procedure.  01/15/2018  JillianOsuch is seen today for follow-up of recent surgery.  Actually she did not have surgery rather was induced by anesthesia and then had significant hypotension and instability requiring IV fluids and pressors.  Based on that response anesthesia recommended that the surgery be canceled.  I spoke with Dr. Alma Friendly subsequent to that about the episode and agreed to see her back to help determine why she may have had such a response.  She actually had significant improvement in LV function by echo 6 months ago back to normal.  She has not had recurrent A. fib and has been euvolemic.  She is on a diuretic.  Weight has been going back up however this is been related to changing her thyroid medications as she was iatrogenically hyperthyroid.   Stress testing was not pursued because she has a history of normal coronaries by cath in 2012 and since her EF improved recently with medical therapy alone, this would have been unlikely if she had significant coronary disease.  I suspect that her hypotension was due to being volume depleted and that she had a marked response to the propofol leading to her hypotension.  06/04/2018  Ms. Ament was seen today for hospital follow-up as a transition of care 7 visit.  Unfortunately she was admitted with confusion and seizure.  She was started on Keppra.  She did recover from this but was noted to be in A. fib with RVR.  Her carvedilol was switched to Toprol-XL 100 mg daily.  As she had hypotension, her diuretic was discontinued.  Today she reports her weight is even lower.  She denies any new swelling.  She has had some confusion and feels hot and chilled at times.  We had difficulty getting her blood pressure today however I believe it is between 90 and [id number] systolic.  This is consistent with her discharge blood pressure readings.  She is concerned she may be on too much metoprolol.  09/09/2018  Ms. Boster is seen today in follow-up.  Overall she is doing well.  In fact she is lost more weight.  She is now 249 pounds.  She is trying to lose enough weight where she might be a candidate for surgery.  She is not had any further seizure activity.  She does have follow-up with her neurologist tomorrow who may adjust her antiepileptic medications.  I had decreased her beta-blocker because of hypotension.  Blood pressure was actually not able to be obtained today.  She says at home generally runs around 100  systolic.  She is not had any presyncopal episodes related to this.  Overall she feels well.  She is actually expressed some interest in exertion and perhaps pulmonary rehabilitation for diastolic heart failure.  She generally has NYHA class II-III symptoms.  12/05/2018  Ms. Twining is seen back today in follow-up.  In  the interim she is [name], NP, is working with her for shortness of breath.  Some additional testing was performed that showed some anemia.  Was also thought may be there was some congestive heart failure.  She was then seen in the ER with concerns about possible DVT/PE.  Work-up there was negative and the chest x-ray suggested the possibility of a pericardial effusion and further testing was recommended.  BNP was close to 300 but not significantly elevated.  Subsequent to that she had started taking Lasix now on a daily basis.  Her last echo showed a low normal LVEF of 50 to 55% with trivial pericardial fluid back in April 2020.  She remains in persistent atrial fibrillation which is rate controlled.  PMHx:  Past Medical History:  Diagnosis Date   Anemia    Asthma    flare up last yr   CHF (congestive heart failure) (HCC)    Chronic headache    " better now "    DOE (dyspnea on exertion)    2D ECHO, 02/12/2012 - EF 60-65%, moderate concentric hypertrophy   Fibromyalgia    nerve pain"left side at waist level" "can't lay on that side without pain" , "HOB elevation helps"; 9-17- since i lost the wieght , i dont  think i have this anymore    Heart murmur    Hematuria - cause not known    resolved    History of kidney stones    x 2 '13, '14 surgery to remove   Hypertension    SBO (small bowel obstruction) ([place]) 06/07/2013   SBO (small bowel obstruction) ([place])    rqueired admission 2020   Sleep apnea    tested several times and came up negative   Thyroid disease    "goiter"   Transfusion history    10 yrs+    Past Surgical History:  Procedure Laterality Date   ABDOMINAL ADHESION SURGERY  2004   open LOA - in [place]  04/04/2010   No significant obstructive coronary artery disease   CARDIOVERSION N/A 08/08/2017   Procedure: CARDIOVERSION;  Surgeon: Pixie Casino, MD;  Location: [facility];  Service: Cardiovascular;  Laterality: N/A;    CHOLECYSTECTOMY  1990   COLONOSCOPY W/ POLYPECTOMY     COLONOSCOPY WITH PROPOFOL N/A 04/10/2014   Procedure: COLONOSCOPY WITH PROPOFOL;  Surgeon: Beryle Beams, MD;  Location: [facility];  Service: Endoscopy;  Laterality: N/A;   EYE SURGERY     lasik 20-25 yrs ago   GASTROPLASTY VERTICAL BANDED  [place] for SBO   REVERSE SHOULDER ARTHROPLASTY Right 03/29/2018   Procedure: REVERSE SHOULDER ARTHROPLASTY;  Surgeon: Netta Cedars, MD;  Location: [facility];  Service: Orthopedics;  Laterality: Right;   ROUX-EN-Y GASTRIC BYPASS  1992   Conversion VBG to RnYGB in [place], [place]   SHOULDER ARTHROSCOPY WITH SUBACROMIAL DECOMPRESSION  2016   Dr Jannett Celestine, [place]   SMALL INTESTINE SURGERY  2000   SBO - LOA w SB resection   TOTAL KNEE ARTHROPLASTY Left 11/14/2018   Procedure: TOTAL KNEE ARTHROPLASTY;  Surgeon:  Paralee Cancel, MD;  Location: [facility];  Service: Orthopedics;  Laterality: Left;  70 mins   TOTAL THYROIDECTOMY  06/26/2012   Dr Celine Ahr, [facility]   TUBAL LIGATION  1986   URETHRAL DILATION  2012   w cystoscopy.  Dr Gaynelle Arabian    FAMHx:  Family History  Problem Relation Age of Onset   Diabetes Mother    Epilepsy Mother    Cancer Mother        Breast   Hypertension Mother    Breast cancer Mother    Seizures Mother    Kidney disease Father    Diabetes Father    Hypertension Father    Asthma Father    Heart disease Father    Epilepsy Sister    Cancer Maternal Grandmother    Breast cancer Maternal Grandmother    Cancer Paternal Grandmother    Breast cancer Paternal Grandmother     SOCHx:   reports that she has never smoked. She has never used smokeless tobacco. She reports that she does not drink alcohol or use drugs.  ALLERGIES:  Allergies  Allergen Reactions   Hydrocodone Itching    Can tolerate with benadryl      ROS: Pertinent items noted in HPI and remainder of  comprehensive ROS otherwise negative.  HOME MEDS: Current Outpatient Medications  Medication Sig Dispense Refill   apixaban (ELIQUIS) 5 MG TABS tablet Take 1 tablet (5 mg total) by mouth 2 (two) times daily. 60 tablet 0   calcitRIOL (ROCALTROL) 0.5 MCG capsule TAKE 1 CAPSULE BY MOUTH TWICE A DAY 180 capsule 0   calcium carbonate (OS-CAL - DOSED IN MG OF ELEMENTAL CALCIUM) 1250 (500 Ca) MG tablet Take 2 tablets (1,000 mg of elemental calcium total) by mouth 2 (two) times daily. (Patient taking differently: Take 2 tablets by mouth 2 (two) times daily with a meal. ) 120 tablet 0   clotrimazole (LOTRIMIN) 1 % cream APPLY TO AFFECTED AREA TWICE A DAY 30 g 0   cyanocobalamin (,VITAMIN B-12,) 1000 MCG/ML injection Inject 1 mL (1,000 mcg total) into the muscle every 30 (thirty) days. 1 mL 0   cyclobenzaprine (FLEXERIL) 10 MG tablet Take 2 tablets (20 mg total) by mouth 2 (two) times daily. 80 tablet 0   docusate sodium (COLACE) 100 MG capsule Take 1 capsule (100 mg total) by mouth 2 (two) times daily. 28 capsule 0   DULoxetine (CYMBALTA) 60 MG capsule Take 1 capsule (60 mg total) by mouth daily. 30 capsule 0   eszopiclone (LUNESTA) 2 MG TABS tablet Take 1 tablet (2 mg total) by mouth at bedtime. 20 tablet 0   FERREX 150 150 MG capsule Take 150 mg by mouth 2 (two) times daily.     fluorometholone (FML) 0.1 % ophthalmic suspension Place 1 drop into both eyes 4 (four) times daily.     HYDROcodone-acetaminophen (NORCO) 7.5-325 MG tablet Take 1-2 tablets by mouth every 4 (four) hours as needed for moderate pain. 60 tablet 0   levETIRAcetam (KEPPRA) 500 MG tablet Take 1 tablet (500 mg total) by mouth 2 (two) times daily. 180 tablet 1   levothyroxine (SYNTHROID) 200 MCG tablet TAKE 1 TABLET BY MOUTH EVERY DAY BEFORE BREAKFAST (Patient taking differently: Take 200 mcg by mouth daily before breakfast. ) 90 tablet 3   magnesium oxide (MAG-OX) 400 MG tablet Take 1 tablet (400 mg total) by mouth daily.  30 tablet 0   metoprolol succinate (TOPROL-XL) 25 MG 24 hr tablet TAKE 1 TABLET  BY MOUTH IN AM AND TAKE YOUR 50 MG TABLETS IN THE EVENING TAKE WITH FOOD (Patient taking differently: Take 25 mg by mouth See admin instructions. TAKE 1 TABLET BY MOUTH IN AM AND TAKE YOUR 25 MG TABLETS IN THE EVENING TAKE WITH FOOD.  Takes 50 total) 90 tablet 3   mirabegron ER (MYRBETRIQ) 50 MG TB24 tablet Take 1 tablet (50 mg total) by mouth daily. 30 tablet 0   NONFORMULARY OR COMPOUNDED ITEM [facility]:  Antifungal Cream - Terbinafine 3%, Fluconazole 2%, Tea Tree Oil 5%, Urea 10%, Ibuprofen 2% in DMSO Suspension #34ml. Apply to affected toenail(s) once daily (at bedtime) or twice daily. 30 each 11   polyethylene glycol (MIRALAX / GLYCOLAX) 17 g packet Take 17 g by mouth 2 (two) times daily. 28 packet 0   traZODone (DESYREL) 50 MG tablet Take 1 tablet (50 mg total) by mouth at bedtime. 30 tablet 0   Vitamin D, Ergocalciferol, (DRISDOL) 1.25 MG (50000 UT) CAPS capsule Take 1 capsule (50,000 Units total) by mouth every Wednesday. 4 capsule 0   furosemide (LASIX) 20 MG tablet Take 1 tablet (20 mg total) by mouth daily as needed (swelling). 30 tablet 11   No current facility-administered medications for this visit.     LABS/IMAGING: No results found for this or any previous visit (from the past 48 hour(s)). No results found.  VITALS: BP 129/88    Pulse 92    Temp (!) 93.9 F (34.4 C)    Ht 5' 6.5" (1.689 m)    Wt 249 lb 12.8 oz (113.3 kg)    SpO2 90%    BMI 39.71 kg/m   EXAM: General appearance: alert and moderately obese Neck: no carotid bruit and no JVD Lungs: diminished breath sounds bilaterally Heart: irregularly irregular rhythm Abdomen: soft, non-tender; bowel sounds normal; no masses,  no organomegaly Extremities: edema trace bilateral edema Pulses: 2+ and symmetric Skin: Skin color, texture, turgor normal. No rashes or lesions Neurologic: Grossly normal Psych:  Pleasant  EKG: Deferred  ASSESSMENT: 1. Acute on chronic HF 2. Anesthesia-associated hypotension 3. New onset A. fib with RVR-CHADSVASC score of 4 -failed cardioversion on amiodarone 4. Chronic diastolic congestive heart failure-LVEF 25% (improved to 60-65% in 06/2017) 5. Super morbid obesity, with failure of 3 gastric bypass procedures 6. Pulmonary hypertension - PA pressure of 64 mmHg, normal LV systolic function 7. Progressive DOE 8. Hypertension-controlled  9. Possible A. fib/ectopic atrial tachycardia-resolved 10. Seizures  PLAN: 1.   Ms. Lingg has recently had some worsening shortness of breath that has not significantly improved with increasing her Lasix to daily use.  There was a question about possible pericardial effusion on chest x-ray and she did have some trivial pericardial fluid 6 months ago.  LVEF at the time was low normal however had been as high 60 to 65% in 2019.  I will repeat an echo to reassess for the development of any new cardiomyopathy or worsening pericardial effusion.  She will continue on her current dose of Lasix.  If the LVEF is stable and there is no significant pericardial fluid, I would look for other causes of her dyspnea.  Pixie Casino, MD, [facility], [place] Director of the [facility] Diplomate of the American Board of Clinical Lipidology Attending Cardiologist  Direct Dial: [phone]   Fax: [phone]  Website:  [email or web address]  Nadean Corwin Tamisha Nordstrom 12/05/2018, 1:24 PM

## 2018-12-06 ENCOUNTER — Inpatient Hospital Stay (HOSPITAL_COMMUNITY)
Admission: EM | Admit: 2018-12-06 | Discharge: 2018-12-09 | DRG: 291 | Disposition: A | Discharge status: Home / Self Care | Payer: Medicare Other | Attending: Internal Medicine | Admitting: Internal Medicine

## 2018-12-06 ENCOUNTER — Emergency Department (HOSPITAL_COMMUNITY): Payer: Medicare Other

## 2018-12-06 ENCOUNTER — Other Ambulatory Visit: Payer: Self-pay

## 2018-12-06 ENCOUNTER — Encounter (HOSPITAL_COMMUNITY): Payer: Self-pay | Admitting: Emergency Medicine

## 2018-12-06 DIAGNOSIS — I272 Pulmonary hypertension, unspecified: Secondary | ICD-10-CM | POA: Diagnosis not present

## 2018-12-06 DIAGNOSIS — N182 Chronic kidney disease, stage 2 (mild): Secondary | ICD-10-CM | POA: Diagnosis present

## 2018-12-06 DIAGNOSIS — R0902 Hypoxemia: Secondary | ICD-10-CM | POA: Diagnosis not present

## 2018-12-06 DIAGNOSIS — Z23 Encounter for immunization: Secondary | ICD-10-CM | POA: Diagnosis not present

## 2018-12-06 DIAGNOSIS — Z96652 Presence of left artificial knee joint: Secondary | ICD-10-CM | POA: Diagnosis present

## 2018-12-06 DIAGNOSIS — I5032 Chronic diastolic (congestive) heart failure: Secondary | ICD-10-CM | POA: Diagnosis present

## 2018-12-06 DIAGNOSIS — F329 Major depressive disorder, single episode, unspecified: Secondary | ICD-10-CM | POA: Diagnosis present

## 2018-12-06 DIAGNOSIS — E039 Hypothyroidism, unspecified: Secondary | ICD-10-CM | POA: Diagnosis present

## 2018-12-06 DIAGNOSIS — M797 Fibromyalgia: Secondary | ICD-10-CM | POA: Diagnosis present

## 2018-12-06 DIAGNOSIS — Z841 Family history of disorders of kidney and ureter: Secondary | ICD-10-CM

## 2018-12-06 DIAGNOSIS — D696 Thrombocytopenia, unspecified: Secondary | ICD-10-CM | POA: Diagnosis present

## 2018-12-06 DIAGNOSIS — R918 Other nonspecific abnormal finding of lung field: Secondary | ICD-10-CM | POA: Diagnosis not present

## 2018-12-06 DIAGNOSIS — D509 Iron deficiency anemia, unspecified: Secondary | ICD-10-CM | POA: Diagnosis present

## 2018-12-06 DIAGNOSIS — Z803 Family history of malignant neoplasm of breast: Secondary | ICD-10-CM

## 2018-12-06 DIAGNOSIS — I130 Hypertensive heart and chronic kidney disease with heart failure and stage 1 through stage 4 chronic kidney disease, or unspecified chronic kidney disease: Secondary | ICD-10-CM | POA: Diagnosis not present

## 2018-12-06 DIAGNOSIS — R0602 Shortness of breath: Secondary | ICD-10-CM

## 2018-12-06 DIAGNOSIS — Z6839 Body mass index (BMI) 39.0-39.9, adult: Secondary | ICD-10-CM

## 2018-12-06 DIAGNOSIS — I5033 Acute on chronic diastolic (congestive) heart failure: Secondary | ICD-10-CM | POA: Diagnosis present

## 2018-12-06 DIAGNOSIS — I4891 Unspecified atrial fibrillation: Secondary | ICD-10-CM | POA: Diagnosis not present

## 2018-12-06 DIAGNOSIS — I482 Chronic atrial fibrillation, unspecified: Secondary | ICD-10-CM | POA: Diagnosis present

## 2018-12-06 DIAGNOSIS — Z825 Family history of asthma and other chronic lower respiratory diseases: Secondary | ICD-10-CM

## 2018-12-06 DIAGNOSIS — Z820 Family history of epilepsy and other diseases of the nervous system: Secondary | ICD-10-CM

## 2018-12-06 DIAGNOSIS — N179 Acute kidney failure, unspecified: Secondary | ICD-10-CM | POA: Diagnosis present

## 2018-12-06 DIAGNOSIS — G4489 Other headache syndrome: Secondary | ICD-10-CM | POA: Diagnosis not present

## 2018-12-06 DIAGNOSIS — J449 Chronic obstructive pulmonary disease, unspecified: Secondary | ICD-10-CM | POA: Diagnosis present

## 2018-12-06 DIAGNOSIS — I2721 Secondary pulmonary arterial hypertension: Secondary | ICD-10-CM | POA: Diagnosis present

## 2018-12-06 DIAGNOSIS — I4819 Other persistent atrial fibrillation: Secondary | ICD-10-CM | POA: Diagnosis not present

## 2018-12-06 DIAGNOSIS — R079 Chest pain, unspecified: Secondary | ICD-10-CM | POA: Diagnosis not present

## 2018-12-06 DIAGNOSIS — F32A Depression, unspecified: Secondary | ICD-10-CM | POA: Diagnosis present

## 2018-12-06 DIAGNOSIS — G4733 Obstructive sleep apnea (adult) (pediatric): Secondary | ICD-10-CM | POA: Diagnosis present

## 2018-12-06 DIAGNOSIS — Z7901 Long term (current) use of anticoagulants: Secondary | ICD-10-CM

## 2018-12-06 DIAGNOSIS — J45909 Unspecified asthma, uncomplicated: Secondary | ICD-10-CM | POA: Diagnosis present

## 2018-12-06 DIAGNOSIS — I5042 Chronic combined systolic (congestive) and diastolic (congestive) heart failure: Secondary | ICD-10-CM | POA: Diagnosis present

## 2018-12-06 DIAGNOSIS — Z833 Family history of diabetes mellitus: Secondary | ICD-10-CM

## 2018-12-06 DIAGNOSIS — I959 Hypotension, unspecified: Secondary | ICD-10-CM | POA: Diagnosis not present

## 2018-12-06 DIAGNOSIS — Z20828 Contact with and (suspected) exposure to other viral communicable diseases: Secondary | ICD-10-CM | POA: Diagnosis present

## 2018-12-06 DIAGNOSIS — Z9884 Bariatric surgery status: Secondary | ICD-10-CM

## 2018-12-06 DIAGNOSIS — D649 Anemia, unspecified: Secondary | ICD-10-CM | POA: Diagnosis present

## 2018-12-06 DIAGNOSIS — I10 Essential (primary) hypertension: Secondary | ICD-10-CM | POA: Diagnosis present

## 2018-12-06 DIAGNOSIS — J9621 Acute and chronic respiratory failure with hypoxia: Secondary | ICD-10-CM | POA: Diagnosis present

## 2018-12-06 DIAGNOSIS — Z8249 Family history of ischemic heart disease and other diseases of the circulatory system: Secondary | ICD-10-CM

## 2018-12-06 LAB — CBC
HCT: 31.5 % — ABNORMAL LOW (ref 36.0–46.0)
Hemoglobin: 9.7 g/dL — ABNORMAL LOW (ref 12.0–15.0)
MCH: 29.9 pg (ref 26.0–34.0)
MCHC: 30.8 g/dL (ref 30.0–36.0)
MCV: 97.2 fL (ref 80.0–100.0)
Platelets: DECREASED 10*3/uL (ref 150–400)
RBC: 3.24 MIL/uL — ABNORMAL LOW (ref 3.87–5.11)
RDW: 15.8 % — ABNORMAL HIGH (ref 11.5–15.5)
WBC: 4.8 10*3/uL (ref 4.0–10.5)
nRBC: 0 % (ref 0.0–0.2)

## 2018-12-06 LAB — BASIC METABOLIC PANEL
Anion gap: 12 (ref 5–15)
BUN: 25 mg/dL — ABNORMAL HIGH (ref 8–23)
CO2: 27 mmol/L (ref 22–32)
Calcium: 8.8 mg/dL — ABNORMAL LOW (ref 8.9–10.3)
Chloride: 101 mmol/L (ref 98–111)
Creatinine, Ser: 1.27 mg/dL — ABNORMAL HIGH (ref 0.44–1.00)
GFR calc Af Amer: 51 mL/min — ABNORMAL LOW (ref >60)
GFR calc non Af Amer: 44 mL/min — ABNORMAL LOW (ref >60)
Glucose, Bld: 79 mg/dL (ref 70–99)
Potassium: 4.8 mmol/L (ref 3.5–5.1)
Sodium: 140 mmol/L (ref 135–145)

## 2018-12-06 LAB — TROPONIN I (HIGH SENSITIVITY)
Troponin I (High Sensitivity): <2 ng/L (ref <18)
Troponin I (High Sensitivity): <2 ng/L (ref <18)

## 2018-12-06 LAB — BRAIN NATRIURETIC PEPTIDE: B Natriuretic Peptide: 182.3 pg/mL — ABNORMAL HIGH (ref 0.0–100.0)

## 2018-12-06 MED ORDER — IOHEXOL 350 MG/ML SOLN
75.0000 mL | Freq: Once | INTRAVENOUS | Status: AC | PRN
  Administered 2018-12-06: 75 mL via INTRAVENOUS

## 2018-12-06 MED ORDER — SODIUM CHLORIDE 0.9% FLUSH: 3.0000 mL | Freq: Once | INTRAVENOUS | Status: DC

## 2018-12-06 MED ORDER — IOHEXOL 350 MG/ML SOLN
100.0000 mL | Freq: Once | INTRAVENOUS | Status: AC | PRN
  Administered 2018-12-06: 100 mL via INTRAVENOUS

## 2018-12-06 NOTE — ED Triage Notes (Addendum)
Sudden onset of sob and weakness 45 mins ago  Hx of afib brought in by EMS states spoke to her dr yesterday and was told about wearing o2 at night

## 2018-12-06 NOTE — ED Notes (Signed)
Patient oxygen saturations 83%, patient drowsy, woke patient from sleep placed on 2LNC, o2 increased to 93%

## 2018-12-06 NOTE — ED Provider Notes (Signed)
Jillian Eaton Provider Note   CSN: [id number] Arrival date & time: 12/06/18  1502     History   Chief Complaint Chief Complaint  Patient presents with   Shortness of Breath    HPI Jillian Eaton is a 68 y.o. female.     HPI  The patient is a 68 year old female with a hx of atrial fibrillation (on Eliquis) who presents with sudden onset dyspnea. She states that she had knee surgery three weeks ago and has had mild shortness of breath since the procedure. She reports sudden onset shortness of breath this afternoon. She was going to physical therapy when she had to stop to catch her breath. She denies chest pain. She has a history of anemia with a recent Hgb of 7.5. She denies a history of DVT/PE. She endorses chills.   She has an extensive history with cardiology. Most recent clinic note on 10/15 states: "Jillian Eaton is seen back today in follow-up.  In the interim she is [name], NP, is working with her for shortness of breath.  Some additional testing was performed that showed some anemia.  Was also thought may be there was some congestive heart failure.  She was then seen in the ER with concerns about possible DVT/PE.  Work-up there was negative and the chest x-ray suggested the possibility of a pericardial effusion and further testing was recommended.  BNP was close to 300 but not significantly elevated.  Subsequent to that she had started taking Lasix now on a daily basis.  Her last echo showed a low normal LVEF of 50 to 55% with trivial pericardial fluid back in April 2020.  She remains in persistent atrial fibrillation which is rate controlled. [name] has recently had some worsening shortness of breath that has not significantly improved with increasing her Lasix to daily use.  There was a question about possible pericardial effusion on chest x-ray and she did have some trivial pericardial fluid 6 months ago.  LVEF at the time was  low normal however had been as high 60 to 65% in 2019.  I will repeat an echo to reassess for the development of any new cardiomyopathy or worsening pericardial effusion.  She will continue on her current dose of Lasix.  If the LVEF is stable and there is no significant pericardial fluid, I would look for other causes of her dyspnea."    Past Medical History:  Diagnosis Date   Anemia    Asthma    flare up last yr   CHF (congestive heart failure) (HCC)    Chronic headache    " better now "    DOE (dyspnea on exertion)    2D ECHO, 02/12/2012 - EF 60-65%, moderate concentric hypertrophy   Fibromyalgia    nerve pain"left side at waist level" "can't lay on that side without pain" , "HOB elevation helps"; 9-17- since i lost the wieght , i dont  think i have this anymore    Heart murmur    Hematuria - cause not known    resolved    History of kidney stones    x 2 '13, '14 surgery to remove   Hypertension    SBO (small bowel obstruction) ([place]) 06/07/2013   SBO (small bowel obstruction) ([place])    rqueired admission 2020   Sleep apnea    tested several times and came up negative   Thyroid disease    "goiter"   Transfusion history  10 yrs+    Patient Active Problem List   Diagnosis Date Noted   Acute on chronic respiratory failure with hypoxia ([place]) 12/07/2018   Hypothyroidism 12/07/2018   Normocytic anemia 12/07/2018   Asthma 12/07/2018   S/P left TKA 11/14/2018   Status post total left knee replacement 11/14/2018   Degenerative joint disease involving multiple joints on both sides of body 07/31/2018   Rotator cuff tear arthropathy of right shoulder 04/05/2018   Overactive bladder 04/05/2018   Steroid-induced hyperglycemia    Generalized OA    S/P shoulder replacement, right 03/29/2018   NICM (nonischemic cardiomyopathy) ([place]) 01/15/2018   Osteoporosis 12/13/2017   Rotator cuff tear arthropathy 11/29/2017   Chronic diastolic CHF (congestive heart  failure) ([place]) 09/28/2017   Acute renal failure superimposed on stage 3 chronic kidney disease ([place]) 09/07/2017   Hypomagnesemia with secondary hypocalcemia 09/07/2017   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   Hypercapnemia 07/13/2017   AKI (acute kidney injury) ([place]) 07/10/2017   Vertigo 07/08/2017   Blurring of visual image 07/08/2017   On anticoagulant therapy 06/19/2017   Chronic respiratory failure with hypoxia ([place]) 04/19/2017   GERD (gastroesophageal reflux disease) 08/28/2016   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   Depressive disorder 08/28/2016   Iatrogenic hypocalcemia 08/28/2016   Chronic pain syndrome    Acute diastolic (congestive) heart failure (HCC)    Oropharyngeal dysphagia    Chronic atrial fibrillation ([place]) 04/28/2016   Vocal cord dysfunction 04/28/2016   Thrombocytopenia ([place]) 04/28/2016   Seizures ([place])    Preoperative cardiovascular examination    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   Leg swelling 10/19/2015   Acute on chronic respiratory failure with hypoxia and hypercapnia ([place]) 08/03/2015   Bilateral leg edema 05/11/2015   Essential hypertension 05/11/2015   Nephrolithiasis 04/12/2015   Numbness in both hands 04/12/2015   Complete tear of left rotator cuff 11/19/2014   Primary osteoarthritis of both knees 03/24/2014   Chronic asthmatic bronchitis ([place]) 07/30/2013   Bariatric surgery status 07/30/2013   Nontoxic multinodular goiter 07/30/2013   Urge incontinence of urine 07/30/2013   Vitamin D deficiency 07/30/2013   B-complex deficiency 07/30/2013   Cough 07/30/2013   Gross hematuria 07/30/2013   Goiter 07/30/2013   Palpitations 07/30/2013   Right flank pain 07/30/2013   SBO (small bowel obstruction) ([place]) 06/07/2013   Pulmonary HTN ([place]) 01/08/2013   Insomnia 03/22/2012   Mild intermittent asthma with acute exacerbation 03/22/2012   Fibromyalgia 10/26/2011   Anemia of  chronic disease 03/28/2011   Morbid obesity with body mass index of 40.0-49.9 ([place]) 09/22/2010    Past Surgical History:  Procedure Laterality Date   ABDOMINAL ADHESION SURGERY  2004   open LOA - in [place]  04/04/2010   No significant obstructive coronary artery disease   CARDIOVERSION N/A 08/08/2017   Procedure: CARDIOVERSION;  Surgeon: Pixie Casino, MD;  Location: [facility] ENDOSCOPY;  Service: Cardiovascular;  Laterality: N/A;   CHOLECYSTECTOMY  1990   COLONOSCOPY W/ POLYPECTOMY     COLONOSCOPY WITH PROPOFOL N/A 04/10/2014   Procedure: COLONOSCOPY WITH PROPOFOL;  Surgeon: Beryle Beams, MD;  Location: [facility] ENDOSCOPY;  Service: Endoscopy;  Laterality: N/A;   EYE SURGERY     lasik 20-25 yrs ago   GASTROPLASTY VERTICAL BANDED  [place] for SBO   REVERSE SHOULDER ARTHROPLASTY Right 03/29/2018   Procedure: REVERSE SHOULDER ARTHROPLASTY;  Surgeon: Netta Cedars, MD;  Location: [facility];  Service: Orthopedics;  Laterality: Right;   ROUX-EN-Y GASTRIC BYPASS  1992   Conversion VBG to RnYGB in [place], [place]   SHOULDER ARTHROSCOPY WITH SUBACROMIAL DECOMPRESSION  2016   Dr Jannett Celestine, [place]   SMALL INTESTINE SURGERY  2000   SBO - LOA w SB resection   TOTAL KNEE ARTHROPLASTY Left 11/14/2018   Procedure: TOTAL KNEE ARTHROPLASTY;  Surgeon: Paralee Cancel, MD;  Location: [facility] ORS;  Service: Orthopedics;  Laterality: Left;  70 mins   TOTAL THYROIDECTOMY  06/26/2012   Dr Celine Ahr, [facility]   TUBAL LIGATION  1986   URETHRAL DILATION  2012   w cystoscopy.  Dr Gaynelle Arabian     OB History   No obstetric history on file.      Home Medications    Prior to Admission medications   Medication Sig Start Date End Date Taking? Authorizing Provider  acetaminophen (TYLENOL) 650 MG CR tablet Take 650 mg by mouth every 8 (eight) hours as needed for pain.   Yes [provider]  apixaban (ELIQUIS)  5 MG TABS tablet Take 1 tablet (5 mg total) by mouth 2 (two) times daily. 04/05/18  Yes Hennie Duos, MD  calcitRIOL (ROCALTROL) 0.5 MCG capsule TAKE 1 CAPSULE BY MOUTH TWICE A DAY Patient taking differently: Take 0.5 mcg by mouth 2 (two) times daily.  10/10/18  Yes Philemon Kingdom, MD  calcium carbonate (OS-CAL - DOSED IN MG OF ELEMENTAL CALCIUM) 1250 (500 Ca) MG tablet Take 2 tablets (1,000 mg of elemental calcium total) by mouth 2 (two) times daily. Patient taking differently: Take 2 tablets by mouth 2 (two) times daily with a meal.  04/05/18  Yes Hennie Duos, MD  clotrimazole (LOTRIMIN) 1 % cream APPLY TO AFFECTED AREA TWICE A DAY Patient taking differently: Apply 1 application topically 2 (two) times daily.  11/18/18  Yes Marzetta Board, DPM  cyanocobalamin (,VITAMIN B-12,) 1000 MCG/ML injection Inject 1 mL (1,000 mcg total) into the muscle every 30 (thirty) days. 04/05/18  Yes Hennie Duos, MD  cyclobenzaprine (FLEXERIL) 10 MG tablet Take 2 tablets (20 mg total) by mouth 2 (two) times daily. 04/05/18  Yes Hennie Duos, MD  DULoxetine (CYMBALTA) 60 MG capsule Take 1 capsule (60 mg total) by mouth daily. 04/05/18  Yes Hennie Duos, MD  eszopiclone (LUNESTA) 2 MG TABS tablet Take 1 tablet (2 mg total) by mouth at bedtime. 04/05/18  Yes Hennie Duos, MD  fluorometholone (FML) 0.1 % ophthalmic suspension Place 1 drop into both eyes 4 (four) times daily. 10/29/18  Yes [provider]  furosemide (LASIX) 20 MG tablet Take 1 tablet (20 mg total) by mouth daily as needed (swelling). Patient taking differently: Take 40 mg by mouth daily as needed (swelling).  06/04/18 12/06/18 Yes Hilty, Nadean Corwin, MD  levETIRAcetam (KEPPRA) 500 MG tablet Take 1 tablet (500 mg total) by mouth 2 (two) times daily. 10/02/18  Yes Suzzanne Cloud, NP  levothyroxine (SYNTHROID) 200 MCG tablet TAKE 1 TABLET BY MOUTH EVERY DAY BEFORE BREAKFAST Patient taking differently: Take 200 mcg by mouth  daily before breakfast.  06/14/18  Yes Hilty, Nadean Corwin, MD  magnesium oxide (MAG-OX) 400 MG tablet Take 1 tablet (400 mg total) by mouth daily. 04/05/18  Yes Hennie Duos, MD  metoprolol succinate (TOPROL-XL) 25 MG 24 hr tablet TAKE 1 TABLET BY MOUTH IN AM AND TAKE YOUR 50 MG TABLETS IN THE EVENING TAKE WITH FOOD Patient taking differently: Take 25 mg  by mouth 2 (two) times daily.  06/27/18  Yes Hilty, Nadean Corwin, MD  mirabegron ER (MYRBETRIQ) 50 MG TB24 tablet Take 1 tablet (50 mg total) by mouth daily. 04/05/18  Yes Hennie Duos, MD  NONFORMULARY OR COMPOUNDED ITEM [facility]:  Antifungal Cream - Terbinafine 3%, Fluconazole 2%, Tea Tree Oil 5%, Urea 10%, Ibuprofen 2% in DMSO Suspension #20ml. Apply to affected toenail(s) once daily (at bedtime) or twice daily. 05/07/18  Yes Marzetta Board, DPM  oxyCODONE (OXY IR/ROXICODONE) 5 MG immediate release tablet Take 5-10 mg by mouth every 6 (six) hours as needed for pain. 12/04/18  Yes [provider]  senna (SENOKOT) 8.6 MG tablet Take 2 tablets by mouth daily.   Yes [provider]  traZODone (DESYREL) 50 MG tablet Take 1 tablet (50 mg total) by mouth at bedtime. 04/05/18  Yes Hennie Duos, MD  Vitamin D, Ergocalciferol, (DRISDOL) 1.25 MG (50000 UT) CAPS capsule Take 1 capsule (50,000 Units total) by mouth every Wednesday. 04/10/18  Yes Hennie Duos, MD  docusate sodium (COLACE) 100 MG capsule Take 1 capsule (100 mg total) by mouth 2 (two) times daily. Patient not taking: Reported on 12/06/2018 11/15/18   Danae Orleans, PA-C  HYDROcodone-acetaminophen (NORCO) 7.5-325 MG tablet Take 1-2 tablets by mouth every 4 (four) hours as needed for moderate pain. Patient not taking: Reported on 12/06/2018 11/15/18   Danae Orleans, PA-C  polyethylene glycol (MIRALAX / GLYCOLAX) 17 g packet Take 17 g by mouth 2 (two) times daily. Patient not taking: Reported on 12/06/2018 11/15/18   Danae Orleans, PA-C    Family  History Family History  Problem Relation Age of Onset   Diabetes Mother    Epilepsy Mother    Cancer Mother        Breast   Hypertension Mother    Breast cancer Mother    Seizures Mother    Kidney disease Father    Diabetes Father    Hypertension Father    Asthma Father    Heart disease Father    Epilepsy Sister    Cancer Maternal Grandmother    Breast cancer Maternal Grandmother    Cancer Paternal Grandmother    Breast cancer Paternal Grandmother     Social History Social History   Tobacco Use   Smoking status: Never Smoker   Smokeless tobacco: Never Used  Substance Use Topics   Alcohol use: No    Comment: wine occ   Drug use: No    Types: Oxycodone    Comment: perscribed     Allergies   Hydrocodone   Review of Systems Review of Systems  Constitutional: Positive for chills. Negative for fever.  HENT: Negative for ear pain and sore throat.   Respiratory: Positive for shortness of breath. Negative for cough.   Cardiovascular: Positive for palpitations. Negative for chest pain.  Gastrointestinal: Negative for abdominal pain, diarrhea, nausea and vomiting.  Genitourinary: Negative for dysuria.  Musculoskeletal: Negative for arthralgias and back pain.  Skin: Negative for color change and rash.  Neurological: Positive for light-headedness. Negative for seizures and syncope.  All other systems reviewed and are negative.    Physical Exam Updated Vital Signs BP 109/76    Pulse 81    Temp 98.3 F (36.8 C) (Oral)    Resp 19    SpO2 99%   Physical Exam Vitals signs and nursing note reviewed.  Constitutional:      General: She is not in acute distress.  Appearance: She is well-developed.  HENT:     Head: Normocephalic and atraumatic.  Eyes:     Conjunctiva/sclera: Conjunctivae normal.  Neck:     Musculoskeletal: Neck supple.  Cardiovascular:     Rate and Rhythm: Normal rate and regular rhythm.  Pulmonary:     Effort: Pulmonary  effort is normal. Tachypnea present. No respiratory distress.     Breath sounds: Normal breath sounds.  Abdominal:     Palpations: Abdomen is soft.     Tenderness: There is no abdominal tenderness.  Skin:    General: Skin is warm and dry.  Neurological:     Mental Status: She is alert.      ED Treatments / Results  Labs (all labs ordered are listed, but only abnormal results are displayed) Labs Reviewed  BASIC METABOLIC PANEL - Abnormal; Notable for the following components:      Result Value   BUN 25 (*)    Creatinine, Ser 1.27 (*)    Calcium 8.8 (*)    GFR calc non Af Amer 44 (*)    GFR calc Af Amer 51 (*)    All other components within normal limits  CBC - Abnormal; Notable for the following components:   RBC 3.24 (*)    Hemoglobin 9.7 (*)    HCT 31.5 (*)    RDW 15.8 (*)    All other components within normal limits  BRAIN NATRIURETIC PEPTIDE - Abnormal; Notable for the following components:   B Natriuretic Peptide 182.3 (*)    All other components within normal limits  SARS CORONAVIRUS 2 (TAT 6-24 HRS)  TROPONIN I (HIGH SENSITIVITY)  TROPONIN I (HIGH SENSITIVITY)    EKG None  Radiology Dg Chest 2 View  Result Date: 12/06/2018 CLINICAL DATA:  Chest pain a few days ago.  Shortness of breath. EXAM: CHEST - 2 VIEW COMPARISON:  None. FINDINGS: Stable cardiomegaly. The hila and mediastinum are normal. No pneumothorax. No nodules or masses. No focal infiltrates or overt edema. IMPRESSION: No active cardiopulmonary disease. Electronically Signed   By: Dorise Bullion III M.D   On: 12/06/2018 15:53   Ct Angio Chest Pe W And/or Wo Contrast  Result Date: 12/06/2018 CLINICAL DATA:  PE suspected, low pretest probability EXAM: CT ANGIOGRAPHY CHEST WITH CONTRAST TECHNIQUE: Multidetector CT imaging of the chest was performed using the standard protocol during bolus administration of intravenous contrast. Multiplanar CT image reconstructions and MIPs were obtained to evaluate the  vascular anatomy. CONTRAST:  146mL OMNIPAQUE IOHEXOL 350 MG/ML SOLN COMPARISON:  Radiograph 12/06/2018, CTA 09/21/2017 FINDINGS: Cardiovascular: Satisfactory opacification of the pulmonary arteries to the segmental level. No evidence of pulmonary embolism. Marked enlargement of the central pulmonary arteries is similar to comparison study. No CT evidence of right heart strain. No elevation of the RV/LV ratio (0.65). Mild cardiomegaly with biatrial enlargement, right greater than left. There is reflux of contrast into the hepatic veins. Small amount of contrast material within the right atrium likely related to intravenous access. Aorta is normal caliber. Shared origin of the brachiocephalic and left common carotid artery. Proximal great vessels are unremarkable. There is distention of the azygos and hemi azygous veins. Mediastinum/Nodes: No enlarged mediastinal or axillary lymph nodes. Thyroid gland, trachea, and esophagus demonstrate no significant findings. Lungs/Pleura: Areas of mosaic attenuation within the lungs may reflect changes seen with imaging during exhalation. Ground-glass opacity seen posteriorly in the right upper lobe could reflect additional volume loss or a developing airspace process. More bandlike opacities in the  right lung base may reflect scarring given presence comparison study from 2019. No pneumothorax. No effusion. No suspicious pulmonary nodules or masses. Upper Abdomen: Extensive postsurgical changes at the diaphragmatic hiatus compatible with history of gastroplasty and gastric bypass. Patient is post cholecystectomy. There is notable fluid distention of the duodenal sweep, nonspecific given the postsurgical changes. Nonobstructing calcifications are present in the interpolar and lower pole right kidney. Musculoskeletal: Multilevel degenerative changes are present in the imaged portions of the spine. Chest wall deformity is similar to comparison. Superior endplate changes of [id number] and  inferior endplate changes of T5 are unchanged from comparison study.No acute osseous abnormality or suspicious osseous lesion. No suspicious chest wall lesions. Postsurgical changes from right shoulder reverse arthroplasty. Intra-articular body noted in the posterior recess of the left glenohumeral joint. Review of the MIP images confirms the above findings. IMPRESSION: 1. No evidence of pulmonary embolism. 2. Chronic central pulmonary arterial enlargement compatible with pulmonary artery hypertension. 3. Mild cardiomegaly with biatrial enlargement, right greater than left. 4. Reflux of contrast into the hepatic veins, suggestive of elevated right heart pressures. Distention of the azygos and hemi azygous veins suggesting suggest increased preload. 5. Ground-glass opacity posteriorly in the right upper lobe could reflect volume loss or a developing airspace process. Difficult to discern given imaging during exhalation. 6. Extensive postsurgical changes at the diaphragmatic hiatus compatible with history of gastroplasty and gastric bypass. There is notable fluid distention of the duodenal sweep, nonspecific given the postsurgical changes. 7. Nonobstructing right nephrolithiasis. 8. Right shoulder hemiarthroplasty.  No acute complication. 9. Intra-articular body of the left shoulder recess. Stable from prior. Electronically Signed   By: Lovena Jillian M.D.   On: 12/06/2018 23:18    Procedures Ultrasound ED Peripheral IV (Provider)  Date/Time: 12/07/2018 2:17 AM Performed by: Regan Lemming, MD Authorized by: Courtney Paris, MD   Procedure details:    Indications: poor IV access     Skin Prep: chlorhexidine gluconate     Location:  Right AC   Angiocath:  18 G   Bedside Ultrasound Guided: Yes     Images: not archived     Patient tolerated procedure without complications: Yes     Dressing applied: Yes     (including critical care time)  Medications Ordered in ED Medications  sodium chloride  flush (NS) 0.9 % injection 3 mL (has no administration in time range)  iohexol (OMNIPAQUE) 350 MG/ML injection 75 mL (75 mLs Intravenous Contrast Given 12/06/18 2111)  iohexol (OMNIPAQUE) 350 MG/ML injection 100 mL (100 mLs Intravenous Contrast Given 12/06/18 2303)     Initial Impression / Assessment and Plan / ED Course  I have reviewed the triage vital signs and the nursing notes.  Pertinent labs & imaging results that were available during my care of the patient were reviewed by me and considered in my medical decision making (see chart for details).  Clinical Course as of Dec 07 242  Fri Dec 06, 2018  1815 Hemoglobin(!): 9.7 [JL]    Clinical Course User Index [JL] Regan Lemming, MD       The patient is a 68 year old female with a history of atrial fibrillation (on Eliquis), recent total knee replacement on the left, previous presentations to the ED with dyspnea and anemia who presents to the ED with dyspnea on exertion and at rest. The patient arrived to the ED afebrile, HDS, in atrial fibrillation with mildly tachycardic rate (no RVR), satting in the low 90s on room air.  Ddx includes symptomatic anemia, PNA, PE (less likely on Eliquis), ACS, pulmonary edema, pericardial effusion, heart failure exacerbation, obesity hypoventilation syndrome.  Initial labs with a Hgb of 9.7, improved from 10d ago at 9.2 and 2 weeks ago at 7.5. troponins x2 negative. BNP mildly elevated at 182. An ultrasound guided IV was placed for vascular access for a CTA.  CTA was performed and was significant for no evidence of PE, chronic central pulmonary arterial enlargement compatible with pHTN, mild cardiomegaly with biatrial enlargement, R>L. Reflux of contrast into the hepatic veins suggestive of elevated right heart pressures and increased preload. She follows with cardiology and last saw Dr. [name] in clinic on 10/15. She is scheduled for an echo on 10/27. Upon chart review, the patient has had an  echocardiograms that showed pulmonary hypertension in the past. An echo in Dec 2019 showed an LVEF of 55-60% with dilated RV and RA, moderate tricuspid regurgitation, and peak PA pressure of 7mm Hg. It was recommended that the patient wear oxygen at night, although she is not using her home O2. She is on metoprolol for her persistent atrial fibrillation and appears to be rate controlled.   The patient had a brief O2 requirement to 2L due to desaturations in bed at rest to the 80s. She was weaned back to room air, however, upon attempts at ambulation, the patient experienced desaturations to the 70s. Dr. Blaine Hamper of hospitalist medicine was consulted for admission.   Final Clinical Impressions(s) / ED Diagnoses   Final diagnoses:  Pulmonary HTN ([place])  SOB (shortness of breath)  Acute on chronic respiratory failure with hypoxia [facility])    ED Discharge Orders    None       Regan Lemming, MD 12/07/18 0244    Tegeler, Gwenyth Allegra, MD 12/07/18 1040

## 2018-12-07 ENCOUNTER — Encounter (HOSPITAL_COMMUNITY): Payer: Self-pay

## 2018-12-07 DIAGNOSIS — I5033 Acute on chronic diastolic (congestive) heart failure: Secondary | ICD-10-CM | POA: Diagnosis not present

## 2018-12-07 DIAGNOSIS — Z841 Family history of disorders of kidney and ureter: Secondary | ICD-10-CM | POA: Diagnosis not present

## 2018-12-07 DIAGNOSIS — I10 Essential (primary) hypertension: Secondary | ICD-10-CM

## 2018-12-07 DIAGNOSIS — Z7901 Long term (current) use of anticoagulants: Secondary | ICD-10-CM | POA: Diagnosis not present

## 2018-12-07 DIAGNOSIS — Z8249 Family history of ischemic heart disease and other diseases of the circulatory system: Secondary | ICD-10-CM | POA: Diagnosis not present

## 2018-12-07 DIAGNOSIS — J45909 Unspecified asthma, uncomplicated: Secondary | ICD-10-CM | POA: Diagnosis present

## 2018-12-07 DIAGNOSIS — Z825 Family history of asthma and other chronic lower respiratory diseases: Secondary | ICD-10-CM | POA: Diagnosis not present

## 2018-12-07 DIAGNOSIS — J449 Chronic obstructive pulmonary disease, unspecified: Secondary | ICD-10-CM

## 2018-12-07 DIAGNOSIS — N179 Acute kidney failure, unspecified: Secondary | ICD-10-CM | POA: Diagnosis not present

## 2018-12-07 DIAGNOSIS — F329 Major depressive disorder, single episode, unspecified: Secondary | ICD-10-CM

## 2018-12-07 DIAGNOSIS — J9621 Acute and chronic respiratory failure with hypoxia: Secondary | ICD-10-CM | POA: Diagnosis not present

## 2018-12-07 DIAGNOSIS — G4733 Obstructive sleep apnea (adult) (pediatric): Secondary | ICD-10-CM | POA: Diagnosis present

## 2018-12-07 DIAGNOSIS — I272 Pulmonary hypertension, unspecified: Secondary | ICD-10-CM

## 2018-12-07 DIAGNOSIS — I4819 Other persistent atrial fibrillation: Secondary | ICD-10-CM | POA: Diagnosis not present

## 2018-12-07 DIAGNOSIS — Z20828 Contact with and (suspected) exposure to other viral communicable diseases: Secondary | ICD-10-CM | POA: Diagnosis not present

## 2018-12-07 DIAGNOSIS — D696 Thrombocytopenia, unspecified: Secondary | ICD-10-CM | POA: Diagnosis not present

## 2018-12-07 DIAGNOSIS — I2721 Secondary pulmonary arterial hypertension: Secondary | ICD-10-CM | POA: Diagnosis present

## 2018-12-07 DIAGNOSIS — Z833 Family history of diabetes mellitus: Secondary | ICD-10-CM | POA: Diagnosis not present

## 2018-12-07 DIAGNOSIS — I361 Nonrheumatic tricuspid (valve) insufficiency: Secondary | ICD-10-CM | POA: Diagnosis not present

## 2018-12-07 DIAGNOSIS — Z6839 Body mass index (BMI) 39.0-39.9, adult: Secondary | ICD-10-CM | POA: Diagnosis not present

## 2018-12-07 DIAGNOSIS — I5032 Chronic diastolic (congestive) heart failure: Secondary | ICD-10-CM | POA: Diagnosis not present

## 2018-12-07 DIAGNOSIS — D649 Anemia, unspecified: Secondary | ICD-10-CM | POA: Diagnosis not present

## 2018-12-07 DIAGNOSIS — E039 Hypothyroidism, unspecified: Secondary | ICD-10-CM | POA: Diagnosis not present

## 2018-12-07 DIAGNOSIS — I482 Chronic atrial fibrillation, unspecified: Secondary | ICD-10-CM

## 2018-12-07 DIAGNOSIS — Z23 Encounter for immunization: Secondary | ICD-10-CM | POA: Diagnosis not present

## 2018-12-07 DIAGNOSIS — R0602 Shortness of breath: Secondary | ICD-10-CM | POA: Diagnosis not present

## 2018-12-07 DIAGNOSIS — N182 Chronic kidney disease, stage 2 (mild): Secondary | ICD-10-CM | POA: Diagnosis not present

## 2018-12-07 DIAGNOSIS — Z820 Family history of epilepsy and other diseases of the nervous system: Secondary | ICD-10-CM | POA: Diagnosis not present

## 2018-12-07 DIAGNOSIS — I130 Hypertensive heart and chronic kidney disease with heart failure and stage 1 through stage 4 chronic kidney disease, or unspecified chronic kidney disease: Secondary | ICD-10-CM | POA: Diagnosis not present

## 2018-12-07 DIAGNOSIS — M797 Fibromyalgia: Secondary | ICD-10-CM | POA: Diagnosis not present

## 2018-12-07 DIAGNOSIS — D509 Iron deficiency anemia, unspecified: Secondary | ICD-10-CM | POA: Diagnosis present

## 2018-12-07 DIAGNOSIS — Z9884 Bariatric surgery status: Secondary | ICD-10-CM | POA: Diagnosis not present

## 2018-12-07 LAB — VITAMIN B12: Vitamin B-12: 1394 pg/mL — ABNORMAL HIGH (ref 180–914)

## 2018-12-07 LAB — CBC
HCT: 28.1 % — ABNORMAL LOW (ref 36.0–46.0)
Hemoglobin: 8.6 g/dL — ABNORMAL LOW (ref 12.0–15.0)
MCH: 30.5 pg (ref 26.0–34.0)
MCHC: 30.6 g/dL (ref 30.0–36.0)
MCV: 99.6 fL (ref 80.0–100.0)
Platelets: 316 10*3/uL (ref 150–400)
RBC: 2.82 MIL/uL — ABNORMAL LOW (ref 3.87–5.11)
RDW: 15.6 % — ABNORMAL HIGH (ref 11.5–15.5)
WBC: 5.2 10*3/uL (ref 4.0–10.5)
nRBC: 0 % (ref 0.0–0.2)

## 2018-12-07 LAB — BASIC METABOLIC PANEL
Anion gap: 8 (ref 5–15)
BUN: 22 mg/dL (ref 8–23)
CO2: 31 mmol/L (ref 22–32)
Calcium: 8.5 mg/dL — ABNORMAL LOW (ref 8.9–10.3)
Chloride: 102 mmol/L (ref 98–111)
Creatinine, Ser: 1.24 mg/dL — ABNORMAL HIGH (ref 0.44–1.00)
GFR calc Af Amer: 52 mL/min — ABNORMAL LOW (ref >60)
GFR calc non Af Amer: 45 mL/min — ABNORMAL LOW (ref >60)
Glucose, Bld: 87 mg/dL (ref 70–99)
Potassium: 3.7 mmol/L (ref 3.5–5.1)
Sodium: 141 mmol/L (ref 135–145)

## 2018-12-07 LAB — MAGNESIUM: Magnesium: 1.4 mg/dL — ABNORMAL LOW (ref 1.7–2.4)

## 2018-12-07 LAB — FOLATE: Folate: 11.9 ng/mL (ref >5.9)

## 2018-12-07 LAB — RETICULOCYTES
Immature Retic Fract: 14 % (ref 2.3–15.9)
RBC.: 3.06 MIL/uL — ABNORMAL LOW (ref 3.87–5.11)
Retic Count, Absolute: 61.5 10*3/uL (ref 19.0–186.0)
Retic Ct Pct: 2 % (ref 0.4–3.1)

## 2018-12-07 LAB — FERRITIN: Ferritin: 65 ng/mL (ref 11–307)

## 2018-12-07 LAB — IRON AND TIBC
Iron: 31 ug/dL (ref 28–170)
Saturation Ratios: 9 % — ABNORMAL LOW (ref 10.4–31.8)
TIBC: 329 ug/dL (ref 250–450)
UIBC: 298 ug/dL

## 2018-12-07 LAB — TROPONIN I (HIGH SENSITIVITY): Troponin I (High Sensitivity): 3 ng/L (ref <18)

## 2018-12-07 LAB — SARS CORONAVIRUS 2 (TAT 6-24 HRS): SARS Coronavirus 2: NEGATIVE

## 2018-12-07 MED ORDER — FUROSEMIDE 10 MG/ML IJ SOLN
40.0000 mg | Freq: Two times a day (BID) | INTRAMUSCULAR | Status: DC
  Administered 2018-12-07 – 2018-12-08 (×3): 40 mg via INTRAVENOUS
  Filled 2018-12-07 (×3): qty 4

## 2018-12-07 MED ORDER — ONDANSETRON HCL 4 MG PO TABS: 4.0000 mg | ORAL_TABLET | Freq: Four times a day (QID) | ORAL | Status: DC | PRN

## 2018-12-07 MED ORDER — ACETAMINOPHEN 325 MG PO TABS
650.0000 mg | ORAL_TABLET | Freq: Four times a day (QID) | ORAL | Status: DC | PRN
  Administered 2018-12-08: 650 mg via ORAL
  Filled 2018-12-07: qty 2

## 2018-12-07 MED ORDER — ONDANSETRON HCL 4 MG/2ML IJ SOLN
4.0000 mg | Freq: Four times a day (QID) | INTRAMUSCULAR | Status: DC | PRN
  Administered 2018-12-07: 4 mg via INTRAVENOUS
  Filled 2018-12-07: qty 2

## 2018-12-07 MED ORDER — APIXABAN 5 MG PO TABS
5.0000 mg | ORAL_TABLET | Freq: Two times a day (BID) | ORAL | Status: DC
  Administered 2018-12-07 – 2018-12-09 (×5): 5 mg via ORAL
  Filled 2018-12-07 (×7): qty 1

## 2018-12-07 MED ORDER — ACETAMINOPHEN 650 MG RE SUPP: 650.0000 mg | Freq: Four times a day (QID) | RECTAL | Status: DC | PRN

## 2018-12-07 MED ORDER — FLUOROMETHOLONE 0.1 % OP SUSP
1.0000 [drp] | Freq: Four times a day (QID) | OPHTHALMIC | Status: DC
  Administered 2018-12-07 – 2018-12-09 (×8): 1 [drp] via OPHTHALMIC
  Filled 2018-12-07: qty 5

## 2018-12-07 MED ORDER — MIRABEGRON ER 50 MG PO TB24
50.0000 mg | ORAL_TABLET | Freq: Every day | ORAL | Status: DC
  Administered 2018-12-07 – 2018-12-09 (×3): 50 mg via ORAL
  Filled 2018-12-07 (×3): qty 1

## 2018-12-07 MED ORDER — ALBUTEROL SULFATE (2.5 MG/3ML) 0.083% IN NEBU: 2.5000 mg | INHALATION_SOLUTION | RESPIRATORY_TRACT | Status: DC | PRN

## 2018-12-07 MED ORDER — SENNA 8.6 MG PO TABS
2.0000 | ORAL_TABLET | Freq: Every day | ORAL | Status: DC
  Administered 2018-12-07 – 2018-12-09 (×3): 17.2 mg via ORAL
  Filled 2018-12-07 (×3): qty 2

## 2018-12-07 MED ORDER — METOPROLOL SUCCINATE ER 25 MG PO TB24
25.0000 mg | ORAL_TABLET | Freq: Two times a day (BID) | ORAL | Status: DC
  Administered 2018-12-07 – 2018-12-09 (×5): 25 mg via ORAL
  Filled 2018-12-07 (×5): qty 1

## 2018-12-07 MED ORDER — DULOXETINE HCL 60 MG PO CPEP
60.0000 mg | ORAL_CAPSULE | Freq: Every day | ORAL | Status: DC
  Administered 2018-12-07 – 2018-12-09 (×3): 60 mg via ORAL
  Filled 2018-12-07 (×3): qty 1

## 2018-12-07 MED ORDER — ALBUTEROL SULFATE HFA 108 (90 BASE) MCG/ACT IN AERS
2.0000 | INHALATION_SPRAY | RESPIRATORY_TRACT | Status: DC | PRN
  Filled 2018-12-07: qty 6.7

## 2018-12-07 MED ORDER — OXYCODONE HCL 5 MG PO TABS: 5.0000 mg | ORAL_TABLET | Freq: Four times a day (QID) | ORAL | Status: DC | PRN

## 2018-12-07 MED ORDER — MAGNESIUM OXIDE 400 (241.3 MG) MG PO TABS
400.0000 mg | ORAL_TABLET | Freq: Every day | ORAL | Status: DC
  Administered 2018-12-07 – 2018-12-09 (×3): 400 mg via ORAL
  Filled 2018-12-07 (×3): qty 1

## 2018-12-07 MED ORDER — LEVOTHYROXINE SODIUM 100 MCG PO TABS
200.0000 ug | ORAL_TABLET | Freq: Every day | ORAL | Status: DC
  Administered 2018-12-07: 200 ug via ORAL
  Filled 2018-12-07: qty 2

## 2018-12-07 MED ORDER — CALCITRIOL 0.25 MCG PO CAPS
0.5000 ug | ORAL_CAPSULE | Freq: Two times a day (BID) | ORAL | Status: DC
  Administered 2018-12-07 – 2018-12-09 (×5): 0.5 ug via ORAL
  Filled 2018-12-07 (×3): qty 2
  Filled 2018-12-07 (×2): qty 1
  Filled 2018-12-07: qty 2

## 2018-12-07 MED ORDER — LEVOTHYROXINE SODIUM 100 MCG PO TABS
200.0000 ug | ORAL_TABLET | Freq: Every day | ORAL | Status: DC
  Administered 2018-12-08 – 2018-12-09 (×2): 200 ug via ORAL
  Filled 2018-12-07 (×2): qty 2

## 2018-12-07 MED ORDER — TRAZODONE HCL 50 MG PO TABS
50.0000 mg | ORAL_TABLET | Freq: Every day | ORAL | Status: DC
  Administered 2018-12-07 – 2018-12-08 (×2): 50 mg via ORAL
  Filled 2018-12-07 (×2): qty 1

## 2018-12-07 MED ORDER — LEVETIRACETAM 500 MG PO TABS
500.0000 mg | ORAL_TABLET | Freq: Two times a day (BID) | ORAL | Status: DC
  Administered 2018-12-07 – 2018-12-09 (×5): 500 mg via ORAL
  Filled 2018-12-07 (×5): qty 1

## 2018-12-07 MED ORDER — ZOLPIDEM TARTRATE 5 MG PO TABS
5.0000 mg | ORAL_TABLET | Freq: Every evening | ORAL | Status: DC | PRN
  Administered 2018-12-07 – 2018-12-08 (×2): 5 mg via ORAL
  Filled 2018-12-07 (×2): qty 1

## 2018-12-07 MED ORDER — INFLUENZA VAC A&B SA ADJ QUAD 0.5 ML IM PRSY
0.5000 mL | PREFILLED_SYRINGE | INTRAMUSCULAR | Status: AC
  Administered 2018-12-08: 0.5 mL via INTRAMUSCULAR
  Filled 2018-12-07: qty 0.5

## 2018-12-07 MED ORDER — HYDRALAZINE HCL 25 MG PO TABS: 25.0000 mg | ORAL_TABLET | Freq: Three times a day (TID) | ORAL | Status: DC | PRN

## 2018-12-07 NOTE — ED Notes (Signed)
ED TO INPATIENT HANDOFF REPORT  ED Nurse Name and Phone #: Celene Squibb RN  S Name/Age/Gender Jillian Eaton 68 y.o. female Room/Bed: 012C/012C  Code Status   Code Status: Full Code  Home/SNF/Other Home Patient oriented to: self, place, time and situation Is this baseline? Yes   Triage Complete: Triage complete  Chief Complaint sob  Triage Note Sudden onset of sob and weakness 45 mins ago  Hx of afib brought in by EMS states spoke to her dr yesterday and was told about wearing o2 at night   Allergies Allergies  Allergen Reactions  . Hydrocodone Itching    Can tolerate with benadryl      Level of Care/Admitting Diagnosis ED Disposition    ED Disposition Condition [facility] Area: [place] [[id number]]  Level of Care: Telemetry Medical [104]  I expect the patient will be discharged within 24 hours: No (not a candidate for 5C-Observation unit)  Covid Evaluation: Asymptomatic Screening Protocol (No Symptoms)  Diagnosis: Acute on chronic respiratory failure with hypoxia [id number]  Admitting Physician: Ivor Costa [[id number]]  Attending Physician: Ivor Costa [[id number]]  PT Class (Do Not Modify): Observation [104]  PT Acc Code (Do Not Modify): Observation [10022]       B Medical/Surgery History Past Medical History:  Diagnosis Date  . Anemia   . Asthma    flare up last yr  . CHF (congestive heart failure) ([place])   . Chronic headache    " better now "   . DOE (dyspnea on exertion)    2D ECHO, 02/12/2012 - EF 60-65%, moderate concentric hypertrophy  . Fibromyalgia    nerve pain"left side at waist level" "can't lay on that side without pain" , "HOB elevation helps"; 9-17- since i lost the wieght , i dont  think i have this anymore   . Heart murmur   . Hematuria - cause not known    resolved   . History of kidney stones    x 2 '13, '14 surgery to remove  . Hypertension   . SBO (small bowel obstruction) ([place]) 06/07/2013   . SBO (small bowel obstruction) ([place])    rqueired admission 2020  . Sleep apnea    tested several times and came up negative  . Thyroid disease    "goiter"  . Transfusion history    10 yrs+   Past Surgical History:  Procedure Laterality Date  . ABDOMINAL ADHESION SURGERY  2004   open LOA - in [place]  . CARDIAC CATHETERIZATION  04/04/2010   No significant obstructive coronary artery disease  . CARDIOVERSION N/A 08/08/2017   Procedure: CARDIOVERSION;  Surgeon: Pixie Casino, MD;  Location: [facility];  Service: Cardiovascular;  Laterality: N/A;  . CHOLECYSTECTOMY  1990  . COLONOSCOPY W/ POLYPECTOMY    . COLONOSCOPY WITH PROPOFOL N/A 04/10/2014   Procedure: COLONOSCOPY WITH PROPOFOL;  Surgeon: Beryle Beams, MD;  Location: [facility];  Service: Endoscopy;  Laterality: N/A;  . EYE SURGERY     lasik 20-25 yrs ago  . GASTROPLASTY VERTICAL BANDED  1985  . [place]   In [place] for SBO  . REVERSE SHOULDER ARTHROPLASTY Right 03/29/2018   Procedure: REVERSE SHOULDER ARTHROPLASTY;  Surgeon: Netta Cedars, MD;  Location: [facility];  Service: Orthopedics;  Laterality: Right;  . ROUX-EN-Y GASTRIC BYPASS  1992   Conversion VBG to RnYGB in [place], [place]  . SHOULDER ARTHROSCOPY WITH SUBACROMIAL DECOMPRESSION  2016  Dr Berenice Primas, [place], [place]  . SMALL INTESTINE SURGERY  2000   SBO - LOA w SB resection  . TOTAL KNEE ARTHROPLASTY Left 11/14/2018   Procedure: TOTAL KNEE ARTHROPLASTY;  Surgeon: Paralee Cancel, MD;  Location: [facility];  Service: Orthopedics;  Laterality: Left;  70 mins  . TOTAL THYROIDECTOMY  06/26/2012   Dr Celine Ahr, [facility]  . TUBAL LIGATION  1986  . URETHRAL DILATION  2012   w cystoscopy.  Dr Hoy Finlay IV Location/Drains/Wounds Patient Lines/Drains/Airways Status   Active Line/Drains/Airways    Name:   Placement date:   Placement time:   Site:   Days:   Peripheral IV 12/06/18 Left;Upper Forearm   12/06/18    2215    Forearm    1   Incision (Closed) 11/14/18 Knee Left   11/14/18    1117     23          Intake/Output Last 24 hours  Intake/Output Summary (Last 24 hours) at 12/07/2018 1359 Last data filed at 12/07/2018 1054 Gross per 24 hour  Intake -  Output 1000 ml  Net -1000 ml    Labs/Imaging Results for orders placed or performed during the hospital encounter of 12/06/18 (from the past 48 hour(s))  Basic metabolic panel     Status: Abnormal   Collection Time: 12/06/18  3:18 PM  Result Value Ref Range   Sodium 140 135 - 145 mmol/L   Potassium 4.8 3.5 - 5.1 mmol/L   Chloride 101 98 - 111 mmol/L   CO2 27 22 - 32 mmol/L   Glucose, Bld 79 70 - 99 mg/dL   BUN 25 (H) 8 - 23 mg/dL   Creatinine, Ser 1.27 (H) 0.44 - 1.00 mg/dL   Calcium 8.8 (L) 8.9 - 10.3 mg/dL   GFR calc non Af Amer 44 (L) >60 mL/min   GFR calc Af Amer 51 (L) >60 mL/min   Anion gap 12 5 - 15    Comment: Performed at [facility], [address] [place], [place] [address]  CBC     Status: Abnormal   Collection Time: 12/06/18  3:18 PM  Result Value Ref Range   WBC 4.8 4.0 - 10.5 K/uL   RBC 3.24 (L) 3.87 - 5.11 MIL/uL   Hemoglobin 9.7 (L) 12.0 - 15.0 g/dL   HCT 31.5 (L) 36.0 - 46.0 %   MCV 97.2 80.0 - 100.0 fL   MCH 29.9 26.0 - 34.0 pg   MCHC 30.8 30.0 - 36.0 g/dL   RDW 15.8 (H) 11.5 - 15.5 %   Platelets  150 - 400 K/uL    PLATELET CLUMPS NOTED ON SMEAR, COUNT APPEARS DECREASED    Comment: Immature Platelet Fraction may be clinically indicated, consider ordering this additional test [id number] REPEATED TO VERIFY SPECIMEN CHECKED FOR CLOTS PLATELET COUNT CONFIRMED BY SMEAR    nRBC 0.0 0.0 - 0.2 %    Comment: Performed at [facility], [place] [address], [place], [place] [address]  Troponin I (High Sensitivity)     Status: None   Collection Time: 12/06/18  3:18 PM  Result Value Ref Range   Troponin I (High Sensitivity) <2 <18 ng/L    Comment: Performed at [facility] [address], [place], [place]  [address]  Troponin I (High Sensitivity)     Status: None   Collection Time: 12/06/18  7:34 PM  Result Value Ref Range   Troponin I (High Sensitivity) <  2 <18 ng/L    Comment: Performed at [facility], [place] [address] [place], [place] [address]  Brain natriuretic peptide     Status: Abnormal   Collection Time: 12/06/18  7:34 PM  Result Value Ref Range   B Natriuretic Peptide 182.3 (H) 0.0 - 100.0 pg/mL    Comment: Performed at [facility] [address], [place], [place] [address]  SARS CORONAVIRUS 2 (TAT 6-24 HRS) Nasopharyngeal Nasopharyngeal Swab     Status: None   Collection Time: 12/07/18  1:39 AM   Specimen: Nasopharyngeal Swab  Result Value Ref Range   SARS Coronavirus 2 NEGATIVE NEGATIVE    Comment: (NOTE) SARS-CoV-2 target nucleic acids are NOT DETECTED. The SARS-CoV-2 RNA is generally detectable in upper and lower respiratory specimens during the acute phase of infection. Negative results do not preclude SARS-CoV-2 infection, do not rule out co-infections with other pathogens, and should not be used as the sole basis for treatment or other patient management decisions. Negative results must be combined with clinical observations, patient history, and epidemiological information. The expected result is Negative. Fact Sheet for Patients: SugarRoll.be Fact Sheet for Healthcare Providers: https://www.woods-mathews.com/ This test is not yet approved or cleared by the [place] FDA and  has been authorized for detection and/or diagnosis of SARS-CoV-2 by FDA under an Emergency Use Authorization (EUA). This EUA will remain  in effect (meaning this test can be used) for the duration of the COVID-19 declaration under Section 56 4(b)(1) of the Act, 21 U.S.C. section 360bbb-3(b)(1), unless the authorization is terminated or revoked sooner. Performed at [facility], [place] [address], [place], [place] [id number]   Basic  metabolic panel     Status: Abnormal   Collection Time: 12/07/18  5:17 AM  Result Value Ref Range   Sodium 141 135 - 145 mmol/L   Potassium 3.7 3.5 - 5.1 mmol/L   Chloride 102 98 - 111 mmol/L   CO2 31 22 - 32 mmol/L   Glucose, Bld 87 70 - 99 mg/dL   BUN 22 8 - 23 mg/dL   Creatinine, Ser 1.24 (H) 0.44 - 1.00 mg/dL   Calcium 8.5 (L) 8.9 - 10.3 mg/dL   GFR calc non Af Amer 45 (L) >60 mL/min   GFR calc Af Amer 52 (L) >60 mL/min   Anion gap 8 5 - 15    Comment: Performed at [facility] [address] [place], [place] [address]  CBC     Status: Abnormal   Collection Time: 12/07/18  5:17 AM  Result Value Ref Range   WBC 5.2 4.0 - 10.5 K/uL   RBC 2.82 (L) 3.87 - 5.11 MIL/uL   Hemoglobin 8.6 (L) 12.0 - 15.0 g/dL   HCT 28.1 (L) 36.0 - 46.0 %   MCV 99.6 80.0 - 100.0 fL   MCH 30.5 26.0 - 34.0 pg   MCHC 30.6 30.0 - 36.0 g/dL   RDW 15.6 (H) 11.5 - 15.5 %   Platelets 316 150 - 400 K/uL   nRBC 0.0 0.0 - 0.2 %    Comment: Performed at [facility], [place] [address] [place], [place] [address]  Magnesium     Status: Abnormal   Collection Time: 12/07/18  5:17 AM  Result Value Ref Range   Magnesium 1.4 (L) 1.7 - 2.4 mg/dL    Comment: Performed at [facility] [address] [place], [place] [address]   Dg Chest 2 View  Result Date: 12/06/2018 CLINICAL DATA:  Chest pain a few days ago.  Shortness of breath. EXAM: CHEST - 2 VIEW COMPARISON:  None. FINDINGS: Stable cardiomegaly. The hila and mediastinum are normal. No pneumothorax. No nodules or masses. No focal infiltrates or overt edema. IMPRESSION: No active cardiopulmonary disease. Electronically Signed   By: Dorise Bullion III M.D   On: 12/06/2018 15:53   Ct Angio Chest Pe W And/or Wo Contrast  Result Date: 12/06/2018 CLINICAL DATA:  PE suspected, low pretest probability EXAM: CT ANGIOGRAPHY CHEST WITH CONTRAST TECHNIQUE: Multidetector CT imaging of the chest was performed using the standard protocol during bolus  administration of intravenous contrast. Multiplanar CT image reconstructions and MIPs were obtained to evaluate the vascular anatomy. CONTRAST:  144mL OMNIPAQUE IOHEXOL 350 MG/ML SOLN COMPARISON:  Radiograph 12/06/2018, CTA 09/21/2017 FINDINGS: Cardiovascular: Satisfactory opacification of the pulmonary arteries to the segmental level. No evidence of pulmonary embolism. Marked enlargement of the central pulmonary arteries is similar to comparison study. No CT evidence of right heart strain. No elevation of the RV/LV ratio (0.65). Mild cardiomegaly with biatrial enlargement, right greater than left. There is reflux of contrast into the hepatic veins. Small amount of contrast material within the right atrium likely related to intravenous access. Aorta is normal caliber. Shared origin of the brachiocephalic and left common carotid artery. Proximal great vessels are unremarkable. There is distention of the azygos and hemi azygous veins. Mediastinum/Nodes: No enlarged mediastinal or axillary lymph nodes. Thyroid gland, trachea, and esophagus demonstrate no significant findings. Lungs/Pleura: Areas of mosaic attenuation within the lungs may reflect changes seen with imaging during exhalation. Ground-glass opacity seen posteriorly in the right upper lobe could reflect additional volume loss or a developing airspace process. More bandlike opacities in the right lung base may reflect scarring given presence comparison study from 2019. No pneumothorax. No effusion. No suspicious pulmonary nodules or masses. Upper Abdomen: Extensive postsurgical changes at the diaphragmatic hiatus compatible with history of gastroplasty and gastric bypass. Patient is post cholecystectomy. There is notable fluid distention of the duodenal sweep, nonspecific given the postsurgical changes. Nonobstructing calcifications are present in the interpolar and lower pole right kidney. Musculoskeletal: Multilevel degenerative changes are present in the  imaged portions of the spine. Chest wall deformity is similar to comparison. Superior endplate changes of [id number] and inferior endplate changes of T5 are unchanged from comparison study.No acute osseous abnormality or suspicious osseous lesion. No suspicious chest wall lesions. Postsurgical changes from right shoulder reverse arthroplasty. Intra-articular body noted in the posterior recess of the left glenohumeral joint. Review of the MIP images confirms the above findings. IMPRESSION: 1. No evidence of pulmonary embolism. 2. Chronic central pulmonary arterial enlargement compatible with pulmonary artery hypertension. 3. Mild cardiomegaly with biatrial enlargement, right greater than left. 4. Reflux of contrast into the hepatic veins, suggestive of elevated right heart pressures. Distention of the azygos and hemi azygous veins suggesting suggest increased preload. 5. Ground-glass opacity posteriorly in the right upper lobe could reflect volume loss or a developing airspace process. Difficult to discern given imaging during exhalation. 6. Extensive postsurgical changes at the diaphragmatic hiatus compatible with history of gastroplasty and gastric bypass. There is notable fluid distention of the duodenal sweep, nonspecific given the postsurgical changes. 7. Nonobstructing right nephrolithiasis. 8. Right shoulder hemiarthroplasty.  No acute complication. 9. Intra-articular body of the left shoulder recess. Stable from prior. Electronically Signed   By: Lovena Le M.D.   On: 12/06/2018 23:18    Pending Labs [employer] (  From admission, onward)    Start     Ordered   12/08/18 0500  CBC  Tomorrow morning,   R     12/07/18 1033   12/08/18 [id number]  Basic metabolic panel  Tomorrow morning,   R     12/07/18 1033   12/07/18 1032  Vitamin B12  (Anemia Panel (PNL))  Add-on,   AD     12/07/18 1031   12/07/18 1032  Folate  (Anemia Panel (PNL))  Add-on,   AD     12/07/18 1031   12/07/18 1032  Iron and TIBC  (Anemia  Panel (PNL))  Add-on,   AD     12/07/18 1031   12/07/18 1032  Ferritin  (Anemia Panel (PNL))  Add-on,   AD     12/07/18 1031   12/07/18 1032  Reticulocytes  (Anemia Panel (PNL))  Add-on,   AD     12/07/18 1031          Vitals/Pain Today's Vitals   12/07/18 0930 12/07/18 1030 12/07/18 1130 12/07/18 1200  BP: 112/81 97/71 104/82 110/88  Pulse: 94 93 94 (!) 104  Resp: (!) 25 20 19  (!) 22  Temp:      TempSrc:      SpO2: 100% 95% 95% 99%  PainSc:        Isolation Precautions No active isolations  Medications Medications  sodium chloride flush (NS) 0.9 % injection 3 mL (has no administration in time range)  oxyCODONE (Oxy IR/ROXICODONE) immediate release tablet 5-10 mg (has no administration in time range)  metoprolol succinate (TOPROL-XL) 24 hr tablet 25 mg (25 mg Oral Given 12/07/18 1045)  DULoxetine (CYMBALTA) DR capsule 60 mg (60 mg Oral Given 12/07/18 1205)  zolpidem (AMBIEN) tablet 5 mg (has no administration in time range)  traZODone (DESYREL) tablet 50 mg (has no administration in time range)  calcitRIOL (ROCALTROL) capsule 0.5 mcg (0.5 mcg Oral Given 12/07/18 1046)  levothyroxine (SYNTHROID) tablet 200 mcg (200 mcg Oral Given 12/07/18 0944)  magnesium oxide (MAG-OX) tablet 400 mg (400 mg Oral Given 12/07/18 1045)  senna (SENOKOT) tablet 17.2 mg (17.2 mg Oral Given 12/07/18 1046)  mirabegron ER (MYRBETRIQ) tablet 50 mg (50 mg Oral Given 12/07/18 1048)  apixaban (ELIQUIS) tablet 5 mg (5 mg Oral Given 12/07/18 1047)  levETIRAcetam (KEPPRA) tablet 500 mg (500 mg Oral Given 12/07/18 1047)  fluorometholone (FML) 0.1 % ophthalmic suspension 1 drop (has no administration in time range)  furosemide (LASIX) injection 40 mg (40 mg Intravenous Given 12/07/18 0559)  albuterol (VENTOLIN HFA) 108 (90 Base) MCG/ACT inhaler 2 puff (has no administration in time range)  acetaminophen (TYLENOL) tablet 650 mg (has no administration in time range)    Or  acetaminophen (TYLENOL)  suppository 650 mg (has no administration in time range)  ondansetron (ZOFRAN) tablet 4 mg ( Oral See Alternative 12/07/18 1213)    Or  ondansetron (ZOFRAN) injection 4 mg (4 mg Intravenous Given 12/07/18 1213)  hydrALAZINE (APRESOLINE) tablet 25 mg (has no administration in time range)  iohexol (OMNIPAQUE) 350 MG/ML injection 75 mL (75 mLs Intravenous Contrast Given 12/06/18 2111)  iohexol (OMNIPAQUE) 350 MG/ML injection 100 mL (100 mLs Intravenous Contrast Given 12/06/18 2303)    Mobility walks     Focused Assessments Pulmonary Assessment Handoff:  Lung sounds: L Breath Sounds: Clear, Diminished R Breath Sounds: Clear O2 Device: Room Air O2 Flow Rate (L/min): 2 L/min      R Recommendations: See Admitting Provider Note  Report given to:  Additional Notes:

## 2018-12-07 NOTE — Progress Notes (Signed)
Patient complaining of sudden onset chest pressure with headache. VS as noted in chart. Paged NP on call and EKG preformed. NP Bodenheimer ordered labs. Will continue to monitor.

## 2018-12-07 NOTE — ED Notes (Signed)
ED TO INPATIENT HANDOFF REPORT  ED Nurse Name and Phone #:  [phone]  S Name/Age/Gender Jillian Eaton 68 y.o. female Room/Bed: 020C/020C  Code Status   Code Status: Full Code  Home/SNF/Other Home Patient oriented to: self, place, time and situation Is this baseline? Yes   Triage Complete: Triage complete  Chief Complaint sob  Triage Note Sudden onset of sob and weakness 45 mins ago  Hx of afib brought in by EMS states spoke to her dr yesterday and was told about wearing o2 at night   Allergies Allergies  Allergen Reactions  . Hydrocodone Itching    Can tolerate with benadryl      Level of Care/Admitting Diagnosis ED Disposition    ED Disposition Condition [facility] Area: [place] [[id number]]  Level of Care: Telemetry Medical [104]  I expect the patient will be discharged within 24 hours: No (not a candidate for 5C-Observation unit)  Covid Evaluation: Asymptomatic Screening Protocol (No Symptoms)  Diagnosis: Acute on chronic respiratory failure with [id number]  Admitting Physician: Ivor Costa [[id number]]  Attending Physician: Ivor Costa [[id number]]  PT Class (Do Not Modify): Observation [104]  PT Acc Code (Do Not Modify): Observation [10022]       B Medical/Surgery History Past Medical History:  Diagnosis Date  . Anemia   . Asthma    flare up last yr  . CHF (congestive heart failure) ([place])   . Chronic headache    " better now "   . DOE (dyspnea on exertion)    2D ECHO, 02/12/2012 - EF 60-65%, moderate concentric hypertrophy  . Fibromyalgia    nerve pain"left side at waist level" "can't lay on that side without pain" , "HOB elevation helps"; 9-17- since i lost the wieght , i dont  think i have this anymore   . Heart murmur   . Hematuria - cause not known    resolved   . History of kidney stones    x 2 '13, '14 surgery to remove  . Hypertension   . SBO (small bowel obstruction) ([place]) 06/07/2013  . SBO  (small bowel obstruction) ([place])    rqueired admission 2020  . Sleep apnea    tested several times and came up negative  . Thyroid disease    "goiter"  . Transfusion history    10 yrs+   Past Surgical History:  Procedure Laterality Date  . ABDOMINAL ADHESION SURGERY  2004   open LOA - in [place]  . CARDIAC CATHETERIZATION  04/04/2010   No significant obstructive coronary artery disease  . CARDIOVERSION N/A 08/08/2017   Procedure: CARDIOVERSION;  Surgeon: Pixie Casino, MD;  Location: [facility];  Service: Cardiovascular;  Laterality: N/A;  . CHOLECYSTECTOMY  1990  . COLONOSCOPY W/ POLYPECTOMY    . COLONOSCOPY WITH PROPOFOL N/A 04/10/2014   Procedure: COLONOSCOPY WITH PROPOFOL;  Surgeon: Beryle Beams, MD;  Location: [facility];  Service: Endoscopy;  Laterality: N/A;  . EYE SURGERY     lasik 20-25 yrs ago  . GASTROPLASTY VERTICAL BANDED  1985  . [place]   In [place] for SBO  . REVERSE SHOULDER ARTHROPLASTY Right 03/29/2018   Procedure: REVERSE SHOULDER ARTHROPLASTY;  Surgeon: Netta Cedars, MD;  Location: [facility];  Service: Orthopedics;  Laterality: Right;  . ROUX-EN-Y GASTRIC BYPASS  1992   Conversion VBG to RnYGB in [place], [place]  . SHOULDER ARTHROSCOPY WITH SUBACROMIAL DECOMPRESSION  2016  Dr Berenice Primas, [place], [place]  . SMALL INTESTINE SURGERY  2000   SBO - LOA w SB resection  . TOTAL KNEE ARTHROPLASTY Left 11/14/2018   Procedure: TOTAL KNEE ARTHROPLASTY;  Surgeon: Paralee Cancel, MD;  Location: [facility];  Service: Orthopedics;  Laterality: Left;  70 mins  . TOTAL THYROIDECTOMY  06/26/2012   Dr Celine Ahr, [facility]  . TUBAL LIGATION  1986  . URETHRAL DILATION  2012   w cystoscopy.  Dr Hoy Finlay IV Location/Drains/Wounds Patient Lines/Drains/Airways Status   Active Line/Drains/Airways    Name:   Placement date:   Placement time:   Site:   Days:   Peripheral IV 12/06/18 Left;Upper Forearm   12/06/18    2215    Forearm   1    Incision (Closed) 11/14/18 Knee Left   11/14/18    1117     23          Intake/Output Last 24 hours No intake or output data in the 24 hours ending 12/07/18 0352  Labs/Imaging Results for orders placed or performed during the hospital encounter of 12/06/18 (from the past 48 hour(s))  Basic metabolic panel     Status: Abnormal   Collection Time: 12/06/18  3:18 PM  Result Value Ref Range   Sodium 140 135 - 145 mmol/L   Potassium 4.8 3.5 - 5.1 mmol/L   Chloride 101 98 - 111 mmol/L   CO2 27 22 - 32 mmol/L   Glucose, Bld 79 70 - 99 mg/dL   BUN 25 (H) 8 - 23 mg/dL   Creatinine, Ser 1.27 (H) 0.44 - 1.00 mg/dL   Calcium 8.8 (L) 8.9 - 10.3 mg/dL   GFR calc non Af Amer 44 (L) >60 mL/min   GFR calc Af Amer 51 (L) >60 mL/min   Anion gap 12 5 - 15    Comment: Performed at [facility], [address] [place], [place] [address]  CBC     Status: Abnormal   Collection Time: 12/06/18  3:18 PM  Result Value Ref Range   WBC 4.8 4.0 - 10.5 K/uL   RBC 3.24 (L) 3.87 - 5.11 MIL/uL   Hemoglobin 9.7 (L) 12.0 - 15.0 g/dL   HCT 31.5 (L) 36.0 - 46.0 %   MCV 97.2 80.0 - 100.0 fL   MCH 29.9 26.0 - 34.0 pg   MCHC 30.8 30.0 - 36.0 g/dL   RDW 15.8 (H) 11.5 - 15.5 %   Platelets  150 - 400 K/uL    PLATELET CLUMPS NOTED ON SMEAR, COUNT APPEARS DECREASED    Comment: Immature Platelet Fraction may be clinically indicated, consider ordering this additional test [id number] REPEATED TO VERIFY SPECIMEN CHECKED FOR CLOTS PLATELET COUNT CONFIRMED BY SMEAR    nRBC 0.0 0.0 - 0.2 %    Comment: Performed at [facility], [place] [address] [place], [place] [address]  Troponin I (High Sensitivity)     Status: None   Collection Time: 12/06/18  3:18 PM  Result Value Ref Range   Troponin I (High Sensitivity) <2 <18 ng/L    Comment: Performed at [facility] [address], [place], [place] [address]  Troponin I (High Sensitivity)     Status: None   Collection Time: 12/06/18  7:34 PM  Result Value  Ref Range   Troponin I (High Sensitivity) <2 <18 ng/L    Comment: Performed at [facility] [address], [place], [place] [address]  Brain natriuretic peptide     Status: Abnormal   Collection Time: 12/06/18  7:34 PM  Result Value Ref Range   B Natriuretic Peptide 182.3 (H) 0.0 - 100.0 pg/mL    Comment: Performed at [facility] [address], [place], [place] [address]   Dg Chest 2 View  Result Date: 12/06/2018 CLINICAL DATA:  Chest pain a few days ago.  Shortness of breath. EXAM: CHEST - 2 VIEW COMPARISON:  None. FINDINGS: Stable cardiomegaly. The hila and mediastinum are normal. No pneumothorax. No nodules or masses. No focal infiltrates or overt edema. IMPRESSION: No active cardiopulmonary disease. Electronically Signed   By: Dorise Bullion III M.D   On: 12/06/2018 15:53   Ct Angio Chest Pe W And/or Wo Contrast  Result Date: 12/06/2018 CLINICAL DATA:  PE suspected, low pretest probability EXAM: CT ANGIOGRAPHY CHEST WITH CONTRAST TECHNIQUE: Multidetector CT imaging of the chest was performed using the standard protocol during bolus administration of intravenous contrast. Multiplanar CT image reconstructions and MIPs were obtained to evaluate the vascular anatomy. CONTRAST:  170mL OMNIPAQUE IOHEXOL 350 MG/ML SOLN COMPARISON:  Radiograph 12/06/2018, CTA 09/21/2017 FINDINGS: Cardiovascular: Satisfactory opacification of the pulmonary arteries to the segmental level. No evidence of pulmonary embolism. Marked enlargement of the central pulmonary arteries is similar to comparison study. No CT evidence of right heart strain. No elevation of the RV/LV ratio (0.65). Mild cardiomegaly with biatrial enlargement, right greater than left. There is reflux of contrast into the hepatic veins. Small amount of contrast material within the right atrium likely related to intravenous access. Aorta is normal caliber. Shared origin of the brachiocephalic and left common carotid artery. Proximal great  vessels are unremarkable. There is distention of the azygos and hemi azygous veins. Mediastinum/Nodes: No enlarged mediastinal or axillary lymph nodes. Thyroid gland, trachea, and esophagus demonstrate no significant findings. Lungs/Pleura: Areas of mosaic attenuation within the lungs may reflect changes seen with imaging during exhalation. Ground-glass opacity seen posteriorly in the right upper lobe could reflect additional volume loss or a developing airspace process. More bandlike opacities in the right lung base may reflect scarring given presence comparison study from 2019. No pneumothorax. No effusion. No suspicious pulmonary nodules or masses. Upper Abdomen: Extensive postsurgical changes at the diaphragmatic hiatus compatible with history of gastroplasty and gastric bypass. Patient is post cholecystectomy. There is notable fluid distention of the duodenal sweep, nonspecific given the postsurgical changes. Nonobstructing calcifications are present in the interpolar and lower pole right kidney. Musculoskeletal: Multilevel degenerative changes are present in the imaged portions of the spine. Chest wall deformity is similar to comparison. Superior endplate changes of [id number] and inferior endplate changes of T5 are unchanged from comparison study.No acute osseous abnormality or suspicious osseous lesion. No suspicious chest wall lesions. Postsurgical changes from right shoulder reverse arthroplasty. Intra-articular body noted in the posterior recess of the left glenohumeral joint. Review of the MIP images confirms the above findings. IMPRESSION: 1. No evidence of pulmonary embolism. 2. Chronic central pulmonary arterial enlargement compatible with pulmonary artery hypertension. 3. Mild cardiomegaly with biatrial enlargement, right greater than left. 4. Reflux of contrast into the hepatic veins, suggestive of elevated right heart pressures. Distention of the azygos and hemi azygous veins suggesting suggest  increased preload. 5. Ground-glass opacity posteriorly in the right upper lobe could reflect volume loss or a developing airspace process. Difficult to discern given imaging during exhalation. 6. Extensive postsurgical changes at the diaphragmatic hiatus compatible with history of gastroplasty and gastric bypass. There  is notable fluid distention of the duodenal sweep, nonspecific given the postsurgical changes. 7. Nonobstructing right nephrolithiasis. 8. Right shoulder hemiarthroplasty.  No acute complication. 9. Intra-articular body of the left shoulder recess. Stable from prior. Electronically Signed   By: Lovena Le M.D.   On: 12/06/2018 23:18    Pending Labs Unresulted Labs (From admission, onward)    Start     Ordered   12/07/18 [id number]  Basic metabolic panel  Tomorrow morning,   R     12/07/18 0306   12/07/18 0500  CBC  Tomorrow morning,   R     12/07/18 0306   12/07/18 0500  Magnesium  Tomorrow morning,   R     12/07/18 0306   12/07/18 0048  SARS CORONAVIRUS 2 (TAT 6-24 HRS) Nasopharyngeal Nasopharyngeal Swab  (Symptomatic/High Risk of Exposure/Tier 1 Patients Labs with Precautions)  Once,   STAT    Question Answer Comment  Is this test for diagnosis or screening Diagnosis of ill patient   Symptomatic for COVID-19 as defined by CDC Yes   Date of Symptom Onset 12/07/2018   Hospitalized for COVID-19 Yes   Admitted to ICU for COVID-19 No   Previously tested for COVID-19 Yes   Resident in a congregate (group) care setting No   Employed in healthcare setting No   Pregnant No      12/07/18 0047          Vitals/Pain Today's Vitals   12/06/18 2030 12/06/18 2045 12/07/18 0032 12/07/18 0130  BP: 114/84 109/76    Pulse:    81  Resp: (!) 21 (!) 24  19  Temp:      TempSrc:      SpO2:    99%  PainSc:   0-No pain     Isolation Precautions Airborne and Contact precautions  Medications Medications  sodium chloride flush (NS) 0.9 % injection 3 mL (has no administration in time  range)  oxyCODONE (Oxy IR/ROXICODONE) immediate release tablet 5-10 mg (has no administration in time range)  metoprolol succinate (TOPROL-XL) 24 hr tablet 25 mg (has no administration in time range)  DULoxetine (CYMBALTA) DR capsule 60 mg (has no administration in time range)  zolpidem (AMBIEN) tablet 5 mg (has no administration in time range)  traZODone (DESYREL) tablet 50 mg (has no administration in time range)  calcitRIOL (ROCALTROL) capsule 0.5 mcg (0.5 mcg Oral Not Given 12/07/18 0352)  levothyroxine (SYNTHROID) tablet 200 mcg (has no administration in time range)  magnesium oxide (MAG-OX) tablet 400 mg (has no administration in time range)  senna (SENOKOT) tablet 17.2 mg (has no administration in time range)  mirabegron ER (MYRBETRIQ) tablet 50 mg (has no administration in time range)  apixaban (ELIQUIS) tablet 5 mg (5 mg Oral Not Given 12/07/18 0351)  levETIRAcetam (KEPPRA) tablet 500 mg (has no administration in time range)  fluorometholone (FML) 0.1 % ophthalmic suspension 1 drop (has no administration in time range)  furosemide (LASIX) injection 40 mg (has no administration in time range)  albuterol (VENTOLIN HFA) 108 (90 Base) MCG/ACT inhaler 2 puff (has no administration in time range)  acetaminophen (TYLENOL) tablet 650 mg (has no administration in time range)    Or  acetaminophen (TYLENOL) suppository 650 mg (has no administration in time range)  ondansetron (ZOFRAN) tablet 4 mg (has no administration in time range)    Or  ondansetron (ZOFRAN) injection 4 mg (has no administration in time range)  iohexol (OMNIPAQUE) 350 MG/ML injection 75 mL (75 mLs Intravenous  Contrast Given 12/06/18 2111)  iohexol (OMNIPAQUE) 350 MG/ML injection 100 mL (100 mLs Intravenous Contrast Given 12/06/18 2303)    Mobility walks with device     Focused Assessments Cardiac Assessment Handoff:  Cardiac Rhythm: Sinus tachycardia Lab Results  Component Value Date   CKTOTAL 201 04/23/2016    CKMB 3.2 08/27/2010   TROPONINI <0.03 05/26/2018   Lab Results  Component Value Date   DDIMER 0.31 08/03/2018   Does the Patient currently have chest pain? No     R Recommendations: See Admitting Provider Note  Report given to:   Additional Notes:  Pt suppose to be wearing O2 at home, but hasn't been.

## 2018-12-07 NOTE — ED Notes (Signed)
I was able yo get patient blood. I tried  In 2 different areas.

## 2018-12-07 NOTE — ED Notes (Signed)
Tele ordered bfast 

## 2018-12-07 NOTE — ED Notes (Signed)
Lunch Tray Ordered @ 1100. 

## 2018-12-07 NOTE — H&P (Signed)
History and Physical    Jillian Eaton [facility] [id number] DOB: [birth year] DOA: 12/06/2018  Referring MD/NP/PA:   PCP: [name], MD   Patient coming from:  The patient is coming from home.  At baseline, pt is independent for most of ADL.        Chief Complaint: SOB  HPI: Jillian Eaton is a 68 y.o. female with medical history significant of A fib on Eliquis, hypertension, asthma, hypothyroidism, depression, small bowel obstruction, CHF, pulmonary hypertension, fibromyalgia, kidney stone, OSA not on CPAP, thrombocytopenia, CKD stage III, atrial fibrillation on Eliquis, who presents with shortness of breath.  Pt had left knee TKA three weeks ago and has had mild shortness of breath since the procedure. She reports sudden onset worsening shortness of breath since yesterday afternoon. She was going to physical therapy when she had to stop to catch her breath.  Patient denies cough, wheezing, fever or chills.  Patient states that recently she had mild chest discomfort, but no chest discomfort or chest pain today.  No tenderness in the calf areas.  She states that her left knee surgical site has healed well.  Denies nausea, vomiting, diarrhea, abdominal pain, symptoms of UTI or unilateral weakness.  Patient states that she has home oxygen, but is not using it.  ED Course: pt was found to have troponin negative x2, BNP 182, pending COVID-19 test, worsening renal function, temperature 93.9, 98.3, blood pressure 109/76, heart rate 80 to 90s, oxygen saturation variation between 80s to 100% on room air.  Chest x-ray negative.  CT angiogram is negative for PE, but showed some groundglass opacity in the right upper lobe. Pt is placed on telemetry bed of observation.  CAT of chest: 1. No evidence of pulmonary embolism. 2. Chronic central pulmonary arterial enlargement compatible with pulmonary artery hypertension. 3. Mild cardiomegaly with biatrial enlargement, right greater than  left. 4. Reflux of contrast into the hepatic veins, suggestive of elevated right heart pressures. Distention of the azygos and hemi azygous veins suggesting suggest increased preload. 5. Ground-glass opacity posteriorly in the right upper lobe could reflect volume loss or a developing airspace process. Difficult to discern given imaging during exhalation. 6. Extensive postsurgical changes at the diaphragmatic hiatus compatible with history of gastroplasty and gastric bypass. There is notable fluid distention of the duodenal sweep, nonspecific given the postsurgical changes. 7. Nonobstructing right nephrolithiasis. 8. Right shoulder hemiarthroplasty.  No acute complication. 9. Intra-articular body of the left shoulder recess. Stable from prior.  Review of Systems:   General: no fevers, chills, no body weight gain, has fatigue HEENT: no blurry vision, hearing changes or sore throat Respiratory: has dyspnea, no coughing, wheezing CV: no chest pain, no palpitations GI: no nausea, vomiting, abdominal pain, diarrhea, constipation GU: no dysuria, burning on urination, increased urinary frequency, hematuria  Ext: has leg edema Neuro: no unilateral weakness, numbness, or tingling, no vision change or hearing loss Skin: no rash, no skin tear. MSK: No muscle spasm, no deformity, no limitation of range of movement in spin Heme: No easy bruising.  Travel history: No recent long distant travel.  Allergy:  Allergies  Allergen Reactions   Hydrocodone Itching    Can tolerate with benadryl      Past Medical History:  Diagnosis Date   Anemia    Asthma    flare up last yr   CHF (congestive heart failure) (HCC)    Chronic headache    " better now "    DOE (dyspnea on  exertion)    2D ECHO, 02/12/2012 - EF 60-65%, moderate concentric hypertrophy   Fibromyalgia    nerve pain"left side at waist level" "can't lay on that side without pain" , "HOB elevation helps"; 9-17- since i lost the  wieght , i dont  think i have this anymore    Heart murmur    Hematuria - cause not known    resolved    History of kidney stones    x 2 '13, '14 surgery to remove   Hypertension    SBO (small bowel obstruction) ([place]) 06/07/2013   SBO (small bowel obstruction) ([place])    rqueired admission 2020   Sleep apnea    tested several times and came up negative   Thyroid disease    "goiter"   Transfusion history    10 yrs+    Past Surgical History:  Procedure Laterality Date   ABDOMINAL ADHESION SURGERY  2004   open LOA - in [place]  04/04/2010   No significant obstructive coronary artery disease   CARDIOVERSION N/A 08/08/2017   Procedure: CARDIOVERSION;  Surgeon: Pixie Casino, MD;  Location: [facility];  Service: Cardiovascular;  Laterality: N/A;   CHOLECYSTECTOMY  1990   COLONOSCOPY W/ POLYPECTOMY     COLONOSCOPY WITH PROPOFOL N/A 04/10/2014   Procedure: COLONOSCOPY WITH PROPOFOL;  Surgeon: Beryle Beams, MD;  Location: [facility];  Service: Endoscopy;  Laterality: N/A;   EYE SURGERY     lasik 20-25 yrs ago   GASTROPLASTY VERTICAL BANDED  [place] for SBO   REVERSE SHOULDER ARTHROPLASTY Right 03/29/2018   Procedure: REVERSE SHOULDER ARTHROPLASTY;  Surgeon: Netta Cedars, MD;  Location: [facility];  Service: Orthopedics;  Laterality: Right;   ROUX-EN-Y GASTRIC BYPASS  1992   Conversion VBG to RnYGB in [place], [place]   SHOULDER ARTHROSCOPY WITH SUBACROMIAL DECOMPRESSION  2016   Dr Jannett Celestine, [place]   SMALL INTESTINE SURGERY  2000   SBO - LOA w SB resection   TOTAL KNEE ARTHROPLASTY Left 11/14/2018   Procedure: TOTAL KNEE ARTHROPLASTY;  Surgeon: Paralee Cancel, MD;  Location: [facility];  Service: Orthopedics;  Laterality: Left;  70 mins   TOTAL THYROIDECTOMY  06/26/2012   Dr Celine Ahr, [facility]   TUBAL LIGATION  1986   URETHRAL DILATION  2012   w cystoscopy.  Dr  Gaynelle Arabian    Social History:  reports that she has never smoked. She has never used smokeless tobacco. She reports that she does not drink alcohol or use drugs.  Family History:  Family History  Problem Relation Age of Onset   Diabetes Mother    Epilepsy Mother    Cancer Mother        Breast   Hypertension Mother    Breast cancer Mother    Seizures Mother    Kidney disease Father    Diabetes Father    Hypertension Father    Asthma Father    Heart disease Father    Epilepsy Sister    Cancer Maternal Grandmother    Breast cancer Maternal Grandmother    Cancer Paternal Grandmother    Breast cancer Paternal Grandmother      Prior to Admission medications   Medication Sig Start Date End Date Taking? Authorizing Provider  acetaminophen (TYLENOL) 650 MG CR tablet Take 650 mg by mouth every 8 (eight) hours as needed for pain.   Yes [provider]  apixaban (ELIQUIS) 5 MG TABS tablet Take 1 tablet (5 mg total) by mouth 2 (two) times daily. 04/05/18  Yes Hennie Duos, MD  calcitRIOL (ROCALTROL) 0.5 MCG capsule TAKE 1 CAPSULE BY MOUTH TWICE A DAY Patient taking differently: Take 0.5 mcg by mouth 2 (two) times daily.  10/10/18  Yes Philemon Kingdom, MD  calcium carbonate (OS-CAL - DOSED IN MG OF ELEMENTAL CALCIUM) 1250 (500 Ca) MG tablet Take 2 tablets (1,000 mg of elemental calcium total) by mouth 2 (two) times daily. Patient taking differently: Take 2 tablets by mouth 2 (two) times daily with a meal.  04/05/18  Yes Hennie Duos, MD  clotrimazole (LOTRIMIN) 1 % cream APPLY TO AFFECTED AREA TWICE A DAY Patient taking differently: Apply 1 application topically 2 (two) times daily.  11/18/18  Yes Marzetta Board, DPM  cyanocobalamin (,VITAMIN B-12,) 1000 MCG/ML injection Inject 1 mL (1,000 mcg total) into the muscle every 30 (thirty) days. 04/05/18  Yes Hennie Duos, MD  cyclobenzaprine (FLEXERIL) 10 MG tablet Take 2 tablets (20 mg total) by mouth 2  (two) times daily. 04/05/18  Yes Hennie Duos, MD  DULoxetine (CYMBALTA) 60 MG capsule Take 1 capsule (60 mg total) by mouth daily. 04/05/18  Yes Hennie Duos, MD  eszopiclone (LUNESTA) 2 MG TABS tablet Take 1 tablet (2 mg total) by mouth at bedtime. 04/05/18  Yes Hennie Duos, MD  fluorometholone (FML) 0.1 % ophthalmic suspension Place 1 drop into both eyes 4 (four) times daily. 10/29/18  Yes [provider]  furosemide (LASIX) 20 MG tablet Take 1 tablet (20 mg total) by mouth daily as needed (swelling). Patient taking differently: Take 40 mg by mouth daily as needed (swelling).  06/04/18 12/06/18 Yes Hilty, Nadean Corwin, MD  levETIRAcetam (KEPPRA) 500 MG tablet Take 1 tablet (500 mg total) by mouth 2 (two) times daily. 10/02/18  Yes Suzzanne Cloud, NP  levothyroxine (SYNTHROID) 200 MCG tablet TAKE 1 TABLET BY MOUTH EVERY DAY BEFORE BREAKFAST Patient taking differently: Take 200 mcg by mouth daily before breakfast.  06/14/18  Yes Hilty, Nadean Corwin, MD  magnesium oxide (MAG-OX) 400 MG tablet Take 1 tablet (400 mg total) by mouth daily. 04/05/18  Yes Hennie Duos, MD  metoprolol succinate (TOPROL-XL) 25 MG 24 hr tablet TAKE 1 TABLET BY MOUTH IN AM AND TAKE YOUR 50 MG TABLETS IN THE EVENING TAKE WITH FOOD Patient taking differently: Take 25 mg by mouth 2 (two) times daily.  06/27/18  Yes Hilty, Nadean Corwin, MD  mirabegron ER (MYRBETRIQ) 50 MG TB24 tablet Take 1 tablet (50 mg total) by mouth daily. 04/05/18  Yes Hennie Duos, MD  NONFORMULARY OR COMPOUNDED ITEM [facility]:  Antifungal Cream - Terbinafine 3%, Fluconazole 2%, Tea Tree Oil 5%, Urea 10%, Ibuprofen 2% in DMSO Suspension #33ml. Apply to affected toenail(s) once daily (at bedtime) or twice daily. 05/07/18  Yes Marzetta Board, DPM  oxyCODONE (OXY IR/ROXICODONE) 5 MG immediate release tablet Take 5-10 mg by mouth every 6 (six) hours as needed for pain. 12/04/18  Yes [provider]  senna (SENOKOT) 8.6 MG  tablet Take 2 tablets by mouth daily.   Yes [provider]  traZODone (DESYREL) 50 MG tablet Take 1 tablet (50 mg total) by mouth at bedtime. 04/05/18  Yes Hennie Duos, MD  Vitamin D, Ergocalciferol, (DRISDOL) 1.25 MG (50000 UT) CAPS capsule Take 1 capsule (50,000 Units total) by mouth every Wednesday. 04/10/18  Yes Hennie Duos,  MD  docusate sodium (COLACE) 100 MG capsule Take 1 capsule (100 mg total) by mouth 2 (two) times daily. Patient not taking: Reported on 12/06/2018 11/15/18   Danae Orleans, PA-C  HYDROcodone-acetaminophen (NORCO) 7.5-325 MG tablet Take 1-2 tablets by mouth every 4 (four) hours as needed for moderate pain. Patient not taking: Reported on 12/06/2018 11/15/18   Danae Orleans, PA-C  polyethylene glycol (MIRALAX / GLYCOLAX) 17 g packet Take 17 g by mouth 2 (two) times daily. Patient not taking: Reported on 12/06/2018 11/15/18   Danae Orleans, PA-C    Physical Exam: Vitals:   12/06/18 2026 12/06/18 2030 12/06/18 2045 12/07/18 0130  BP: 109/79 114/84 109/76   Pulse: 84   81  Resp: (!) 22 (!) 21 (!) 24 19  Temp: 98.3 F (36.8 C)     TempSrc: Oral     SpO2: 100%   99%   General: Not in acute distress HEENT:       Eyes: PERRL, EOMI, no scleral icterus.       ENT: No discharge from the ears and nose, no pharynx injection, no tonsillar enlargement.        Neck: Difficult to assess JVD due to obesity, no bruit, no mass felt. Heme: No neck lymph node enlargement. Cardiac: S1/S2, RRR, has murmurs, No gallops or rubs. Respiratory:  No rales, wheezing, rhonchi or rubs. GI: Soft, nondistended, nontender, no rebound pain, no organomegaly, BS present. GU: No hematuria Ext: has trace pitting leg edema bilaterally. 2+DP/PT pulse bilaterally. Musculoskeletal: No joint deformities, No joint redness or warmth, no limitation of ROM in spin. Skin: No rashes.  Neuro: Alert, oriented X3, cranial nerves II-XII grossly intact, moves all extremities normally.    Psych: Patient is not psychotic, no suicidal or hemocidal ideation.  Labs on Admission: I have personally reviewed following labs and imaging studies  CBC: Recent Labs  Lab 12/06/18 1518  WBC 4.8  HGB 9.7*  HCT 31.5*  MCV 97.2  PLT PLATELET CLUMPS NOTED ON SMEAR, COUNT APPEARS DECREASED   Basic Metabolic Panel: Recent Labs  Lab 12/06/18 1518  NA 140  K 4.8  CL 101  CO2 27  GLUCOSE 79  BUN 25*  CREATININE 1.27*  CALCIUM 8.8*   GFR: Estimated Creatinine Clearance: 56.1 mL/min (A) (by C-G formula based on SCr of 1.27 mg/dL (H)). Liver Function Tests: No results for input(s): AST, ALT, ALKPHOS, BILITOT, PROT, ALBUMIN in the last 168 hours. No results for input(s): LIPASE, AMYLASE in the last 168 hours. No results for input(s): AMMONIA in the last 168 hours. Coagulation Profile: No results for input(s): INR, PROTIME in the last 168 hours. Cardiac Enzymes: No results for input(s): CKTOTAL, CKMB, CKMBINDEX, TROPONINI in the last 168 hours. BNP (last 3 results) No results for input(s): PROBNP in the last 8760 hours. HbA1C: No results for input(s): HGBA1C in the last 72 hours. CBG: No results for input(s): GLUCAP in the last 168 hours. Lipid Profile: No results for input(s): CHOL, HDL, LDLCALC, TRIG, CHOLHDL, LDLDIRECT in the last 72 hours. Thyroid Function Tests: No results for input(s): TSH, T4TOTAL, FREET4, T3FREE, THYROIDAB in the last 72 hours. Anemia Panel: No results for input(s): VITAMINB12, FOLATE, FERRITIN, TIBC, IRON, RETICCTPCT in the last 72 hours. Urine analysis:    Component Value Date/Time   COLORURINE YELLOW 07/31/2018 0657   APPEARANCEUR HAZY (A) 07/31/2018 0657   LABSPEC 1.015 07/31/2018 0657   PHURINE 5.0 07/31/2018 0657   GLUCOSEU NEGATIVE 07/31/2018 0657   HGBUR MODERATE (A) 07/31/2018  [place] 07/31/2018 [place] 07/31/2018 [place] 07/31/2018 0657   UROBILINOGEN 1.0 06/06/2013 2154    NITRITE POSITIVE (A) 07/31/2018 0657   LEUKOCYTESUR SMALL (A) 07/31/2018 0657   Sepsis Labs: @LABRCNTIP (procalcitonin:4,lacticidven:4) ) Recent Results (from the past 240 hour(s))  SARS CORONAVIRUS 2 (TAT 6-24 HRS) Nasopharyngeal Nasopharyngeal Swab     Status: None   Collection Time: 11/27/18 12:35 PM   Specimen: Nasopharyngeal Swab  Result Value Ref Range Status   SARS Coronavirus 2 NEGATIVE NEGATIVE Final    Comment: (NOTE) SARS-CoV-2 target nucleic acids are NOT DETECTED. The SARS-CoV-2 RNA is generally detectable in upper and lower respiratory specimens during the acute phase of infection. Negative results do not preclude SARS-CoV-2 infection, do not rule out co-infections with other pathogens, and should not be used as the sole basis for treatment or other patient management decisions. Negative results must be combined with clinical observations, patient history, and epidemiological information. The expected result is Negative. Fact Sheet for Patients: SugarRoll.be Fact Sheet for Healthcare Providers: https://www.woods-mathews.com/ This test is not yet approved or cleared by the [place] FDA and  has been authorized for detection and/or diagnosis of SARS-CoV-2 by FDA under an Emergency Use Authorization (EUA). This EUA will remain  in effect (meaning this test can be used) for the duration of the COVID-19 declaration under Section 56 4(b)(1) of the Act, 21 U.S.C. section 360bbb-3(b)(1), unless the authorization is terminated or revoked sooner. Performed at [facility], [place] [address], [place], [place] [address]      Radiological Exams on Admission: Dg Chest 2 View  Result Date: 12/06/2018 CLINICAL DATA:  Chest pain a few days ago.  Shortness of breath. EXAM: CHEST - 2 VIEW COMPARISON:  None. FINDINGS: Stable cardiomegaly. The hila and mediastinum are normal. No pneumothorax. No nodules or masses. No focal infiltrates  or overt edema. IMPRESSION: No active cardiopulmonary disease. Electronically Signed   By: Dorise Bullion III M.D   On: 12/06/2018 15:53   Ct Angio Chest Pe W And/or Wo Contrast  Result Date: 12/06/2018 CLINICAL DATA:  PE suspected, low pretest probability EXAM: CT ANGIOGRAPHY CHEST WITH CONTRAST TECHNIQUE: Multidetector CT imaging of the chest was performed using the standard protocol during bolus administration of intravenous contrast. Multiplanar CT image reconstructions and MIPs were obtained to evaluate the vascular anatomy. CONTRAST:  186mL OMNIPAQUE IOHEXOL 350 MG/ML SOLN COMPARISON:  Radiograph 12/06/2018, CTA 09/21/2017 FINDINGS: Cardiovascular: Satisfactory opacification of the pulmonary arteries to the segmental level. No evidence of pulmonary embolism. Marked enlargement of the central pulmonary arteries is similar to comparison study. No CT evidence of right heart strain. No elevation of the RV/LV ratio (0.65). Mild cardiomegaly with biatrial enlargement, right greater than left. There is reflux of contrast into the hepatic veins. Small amount of contrast material within the right atrium likely related to intravenous access. Aorta is normal caliber. Shared origin of the brachiocephalic and left common carotid artery. Proximal great vessels are unremarkable. There is distention of the azygos and hemi azygous veins. Mediastinum/Nodes: No enlarged mediastinal or axillary lymph nodes. Thyroid gland, trachea, and esophagus demonstrate no significant findings. Lungs/Pleura: Areas of mosaic attenuation within the lungs may reflect changes seen with imaging during exhalation. Ground-glass opacity seen posteriorly in the right upper lobe could reflect additional volume loss or a developing airspace process. More bandlike opacities in the right lung base may reflect scarring given presence comparison study from 2019. No pneumothorax. No effusion.  No suspicious pulmonary nodules or masses. Upper Abdomen:  Extensive postsurgical changes at the diaphragmatic hiatus compatible with history of gastroplasty and gastric bypass. Patient is post cholecystectomy. There is notable fluid distention of the duodenal sweep, nonspecific given the postsurgical changes. Nonobstructing calcifications are present in the interpolar and lower pole right kidney. Musculoskeletal: Multilevel degenerative changes are present in the imaged portions of the spine. Chest wall deformity is similar to comparison. Superior endplate changes of [id number] and inferior endplate changes of T5 are unchanged from comparison study.No acute osseous abnormality or suspicious osseous lesion. No suspicious chest wall lesions. Postsurgical changes from right shoulder reverse arthroplasty. Intra-articular body noted in the posterior recess of the left glenohumeral joint. Review of the MIP images confirms the above findings. IMPRESSION: 1. No evidence of pulmonary embolism. 2. Chronic central pulmonary arterial enlargement compatible with pulmonary artery hypertension. 3. Mild cardiomegaly with biatrial enlargement, right greater than left. 4. Reflux of contrast into the hepatic veins, suggestive of elevated right heart pressures. Distention of the azygos and hemi azygous veins suggesting suggest increased preload. 5. Ground-glass opacity posteriorly in the right upper lobe could reflect volume loss or a developing airspace process. Difficult to discern given imaging during exhalation. 6. Extensive postsurgical changes at the diaphragmatic hiatus compatible with history of gastroplasty and gastric bypass. There is notable fluid distention of the duodenal sweep, nonspecific given the postsurgical changes. 7. Nonobstructing right nephrolithiasis. 8. Right shoulder hemiarthroplasty.  No acute complication. 9. Intra-articular body of the left shoulder recess. Stable from prior. Electronically Signed   By: Lovena Le M.D.   On: 12/06/2018 23:18     EKG:  Not done in  ED, will get one.   Assessment/Plan Principal Problem:   Acute on chronic respiratory failure with hypoxia (HCC) Active Problems:   Pulmonary HTN (HCC)   Essential hypertension   Chronic atrial fibrillation (HCC)   Thrombocytopenia (HCC)   Chronic asthmatic bronchitis (HCC)   Depressive disorder   Chronic diastolic CHF (congestive heart failure) (HCC)   Acute renal failure superimposed on stage 3 chronic kidney disease (HCC)   Hypothyroidism   Normocytic anemia   Asthma   Acute on chronic respiratory failure with hypoxia ([place]): Etiology is not clear.  CT angiogram is negative for PE.  Her lung auscultation is clear, does not seem to have asthma exacerbation.  Due to morbid obesity, it is very difficult to assess volume status.  Patient has mildly elevated BNP 182, indicating possible CHF exacerbation in the setting of pulmonary hypertension.  We will try IV Lasix.  CT angiogram showed groundglass opacity in the right upper lobe, but patient does not have fever or leukocytosis.  Low suspicions for pneumonia or COVID-19.  Pending COVID-19 test now.  Patient has history of OSA, but she is not on CPAP, which may have contributed to her shortness of breath partially.  2D echo on 05/27/2018 showed EF of 50-55%. Pt has home oxygen, but she does not use it.  Patient likely need to restart using home oxygen.  -Placed on telemetry bed for observation. -lasix 40 mg bid -low salt diet -prn albuterol inhaler. -repeat 2D echo  Chronic diastolic CHF (congestive heart failure) and Pulmonary HTN  -see above  Essential hypertension: -Metoprolol -Patient is on IV Lasix -As needed hydralazine orally  Chronic atrial fibrillation ([place]): CHA2DS2-VASc Score is 4, needs oral anticoagulation. Patient is on Eliquis at home. Heart rate is well controlled. -Continue Eliquis and metoprolol  hx of Thrombocytopenia ([place]): pending plate on  CBC -f/u CBC  Chronic asthmatic bronchitis ([place]): Lungs clear on  auscultation. -As needed albuterol inhalers  Depressive disorder: no SI or HI -Continue home medication  Acute renal failure superimposed on stage 3 chronic kidney disease ([place]): Baseline creatinine around 1.0.  Her creatinine is 1.27, BUN 25.  Slightly worsening. -Avoid using renal toxic medications, such as NSAIDs -Watch renal function closely while on IV lasix, follow-up by BMP  Hypothyroidism: -Synthroid  Normocytic anemia: Hgb stable. 9.2 on 11/27/18 --> 9.7 today. -f/u by CBC    DVT ppx: On Eliquis Code Status: Full code Family Communication: None at bed side.    Disposition Plan:  Anticipate discharge back to previous home environment Consults called:  none Admission status: Obs / tele    Date of Service 12/07/2018    [facility]   If 7PM-7AM, please contact night-coverage [email or web address] Password [email or web address] 12/07/2018, 2:25 AM

## 2018-12-07 NOTE — Evaluation (Signed)
Physical Therapy Evaluation Patient Details Name: Jillian Eaton [facility] MRN: [id number] DOB: 1951/01/05 Today's Date: 12/07/2018   History of Present Illness  68 y.o. female with medical history significant of A fib on Eliquis, hypertension, asthma, hypothyroidism, depression, small bowel obstruction, CHF, pulmonary hypertension, fibromyalgia, kidney stone, OSA not on CPAP, thrombocytopenia, CKD stage III, atrial fibrillation on Eliquis, who presented with shortness of breath. Lt TKA approx 3 weeks ago.  Clinical Impression  Pt admitted with above diagnosis. Ambulating safely although dyspneic on 2L at 80% SpO2, and 100% on 4L (Waveform questionable for accurate assessment - would monitor.) Pt aware of HEP for recent Lt TKA and precautions.  Pt currently with functional limitations due to the deficits listed below (see PT Problem List). Pt will benefit from skilled PT to increase their independence and safety with mobility to allow discharge to the venue listed below.       Follow Up Recommendations Outpatient PT    Equipment Recommendations  None recommended by PT    Recommendations for Other Services       Precautions / Restrictions Precautions Precautions: Fall;Knee Restrictions Weight Bearing Restrictions: No      Mobility  Bed Mobility Overal bed mobility: Needs Assistance Bed Mobility: Supine to Sit;Sit to Supine     Supine to sit: Supervision Sit to supine: Supervision   General bed mobility comments: Slow but able to perform without physical assist min guard in/out of bed, some difficulty getting LLE back into bed.  Transfers Overall transfer level: Needs assistance Equipment used: Rolling walker (2 wheeled) Transfers: Sit to/from Stand Sit to Stand: Supervision         General transfer comment: supervision for safety. Performed well with proper hand placement.   Ambulation/Gait Ambulation/Gait assistance: Supervision Gait Distance (Feet): 100  Feet Assistive device: Rolling walker (2 wheeled) Gait Pattern/deviations: Step-through pattern;Decreased step length - right;Decreased step length - left;Decreased stride length;Decreased stance time - left;Trunk flexed Gait velocity: decreased   General Gait Details: No overt instability, relies modestly on RW for support. Dyspneic (SpO2 80% on 2L after about 50 feet however poor waveform and quickly climbed to 100% with adjustment to 4L supplemental O2.)  Stairs            Wheelchair Mobility    Modified Rankin (Stroke Patients Only)       Balance Overall balance assessment: Needs assistance Sitting-balance support: No upper extremity supported;Feet supported Sitting balance-Leahy Scale: Normal     Standing balance support: During functional activity;No upper extremity supported Standing balance-Leahy Scale: Fair                               Pertinent Vitals/Pain Pain Assessment: 0-10 Pain Score: 3  Pain Location: L knee Pain Descriptors / Indicators: Sore Pain Intervention(s): Monitored during session;Repositioned    Home Living Family/patient expects to be discharged to:: Private residence Living Arrangements: Spouse/significant other Available Help at Discharge: Family;Available 24 hours/day Type of Home: Apartment Home Access: Level entry;Elevator     Home Layout: One level Home Equipment: Walker - 2 wheels;Cane - single point;Shower seat;Grab bars - tub/shower;Grab bars - toilet Additional Comments: spouse has some health issues and is unable to do heavy lifting; pt is his primary caregiver, he can mobilize independently and does not require physical assistance    Prior Function Level of Independence: Independent with assistive device(s)         Comments: Using RW for mobility since TKA.  Hand Dominance   Dominant Hand: Right    Extremity/Trunk Assessment   Upper Extremity Assessment Upper Extremity Assessment: Defer to OT  evaluation    Lower Extremity Assessment Lower Extremity Assessment: LLE deficits/detail LLE Deficits / Details: Gross weakness and restrictions as expected post-op TKA 3 weeks ago       Communication   Communication: No difficulties  Cognition Arousal/Alertness: Awake/alert Behavior During Therapy: WFL for tasks assessed/performed Overall Cognitive Status: Within Functional Limits for tasks assessed                                        General Comments General comments (skin integrity, edema, etc.): SpO2 3L at rest 97%; ambulating down to 80% with DOE however poor waveform and rapidly jumped to 100% on 4L so question accuracy - continue to monitor.    Exercises     Assessment/Plan    PT Assessment Patient needs continued PT services  PT Problem List Decreased strength;Decreased balance;Decreased range of motion;Decreased mobility;Decreased activity tolerance;Cardiopulmonary status limiting activity;Obesity       PT Treatment Interventions DME instruction;Functional mobility training;Balance training;Patient/family education;Gait training;Therapeutic activities;Stair training;Therapeutic exercise;Neuromuscular re-education    PT Goals (Current goals can be found in the Care Plan section)  Acute Rehab PT Goals Patient Stated Goal: Get well, continue rehab for Knee, be able to breath better PT Goal Formulation: With patient Time For Goal Achievement: 12/21/18 Potential to Achieve Goals: Good    Frequency Min 3X/week   Barriers to discharge        Co-evaluation               AM-PAC PT "6 Clicks" Mobility  Outcome Measure Help needed turning from your back to your side while in a flat bed without using bedrails?: None Help needed moving from lying on your back to sitting on the side of a flat bed without using bedrails?: None Help needed moving to and from a bed to a chair (including a wheelchair)?: None Help needed standing up from a chair  using your arms (e.g., wheelchair or bedside chair)?: None Help needed to walk in hospital room?: A Little Help needed climbing 3-5 steps with a railing? : A Little 6 Click Score: 22    End of Session Equipment Utilized During Treatment: Gait belt;Oxygen Activity Tolerance: Patient tolerated treatment well Patient left: in bed;with call bell/phone within reach;with bed alarm set;with nursing/sitter in room Nurse Communication: Mobility status PT Visit Diagnosis: Difficulty in walking, not elsewhere classified (R26.2);Muscle weakness (generalized) (M62.81);Unsteadiness on feet (R26.81);Other abnormalities of gait and mobility (R26.89)    Time: [id number] PT Time Calculation (min) (ACUTE ONLY): 28 min   Charges:   PT Evaluation $PT Eval Low Complexity: 1 Low PT Treatments $Gait Training: 8-22 mins        [employer], PT   Ellouise Newer 12/07/2018, 3:24 PM

## 2018-12-07 NOTE — Progress Notes (Signed)
Markecia Igarashi [facility] [id number] Admission Data: 12/07/2018 2:55 PM Attending Provider: Domenic Polite, MD  [id number], Larey Dresser, MD  Jillian Eaton is a 68 y.o. female patient admitted from ED awake, alert  & orientated  X 3,  Full Code, VSS - Blood pressure 104/82, pulse 70, temperature 97.6 F (36.4 C), temperature source Oral, resp. rate 16, SpO2 100 %., on 3L O2 No c/o shortness of breath, no c/o chest pain, no distress noted. Tele # [phone] placed and pt is currently running atrial fibrillation in the 90's.  Pt orientation to unit, room and routine. Information packet given to patient/family.  Admission INP armband ID verified with patient/family, and in place. SR up x 2, fall risk assessment complete with patient and family verbalizing understanding of risks associated with falls. Pt verbalizes an understanding of how to use the call bell and to call for help before getting out of bed.  Skin, clean-dry- intact without evidence of bruising, or skin tears.   No evidence of skin break down noted on exam. Surgical incision noted to patient's L knee.  Will continue to monitor and assist as needed.  Hiram Comber, RN 12/07/2018 2:55 PM

## 2018-12-07 NOTE — ED Notes (Signed)
Pt placed on 2 liters while sleeping due to sats dropping to 81 % on room air

## 2018-12-07 NOTE — Progress Notes (Addendum)
PROGRESS NOTE    Jillian Eaton  [id number] DOB: [birth year] DOA: 12/06/2018 PCP: [name], MD  Brief Narrative: Jillian Eaton is a 68 y.o. female with medical history significant of A fib on Eliquis, hypertension, asthma, hypothyroidism, depression, small bowel obstruction, CHF, pulmonary hypertension, fibromyalgia, kidney stone, OSA not on CPAP, thrombocytopenia, CKD stage III, atrial fibrillation on Eliquis, who presented with shortness of breath. -had left knee TKA three weeks ago and has had mild shortness of breath since the procedure. She reports sudden onset worsening shortness of breath since yesterday afternoon.  -ED Course: -CT angiogram is negative for PE, but showed some groundglass opacity in the right upper lobe, pulmonary arterial hypertension, reflux of contrast into the hepatic vein suggestive of elevated right heart pressures   Assessment & Plan:   Acute on chronic respiratory failure with hypoxia Acute on chronic diastolic CHF, pulmonary hypertension -CTA negative for PE -Dyspnea progressive since recent hospitalization 3 weeks ago for knee arthroplasty, clinically volume overloaded, improving with diuresis -still dyspneic with any activity today -CT notes question mild groundglass opacity in the right upper lobe, no symptoms of pneumonia could be fluid related, COVID-19 PCR is negative -Continue IV Lasix 40 mg every 12 today -Monitor I's/O, daily weights -Apparently has home oxygen but has not been using this and takes Lasix PRN 20 to 40 mg daily -Monitor B met closely  Acute on chronic diastolic CHF (congestive heart failure) and Pulmonary HTN  -see above  Essential hypertension: -Continue metoprolol  Chronic atrial fibrillation ([place]): CHA2DS2-VASc Score is 4,  -Continue Eliquis and metoprolol  hx of Thrombocytopenia ([place]):  -Monitor  Chronic asthmatic bronchitis ([place]):  -Continue PRN albuterol  Depressive disorder:  no SI or HI -Continue home medication  Acute renal failure superimposed on stage 3 chronic kidney disease ([place]): Baseline creatinine around 1.0.  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    Disposition Plan:  Anticipate discharge  Consultants:      Procedures:   Antimicrobials:    Subjective: -Reports progressive mild dyspnea on exertion since her knee surgery 3 weeks ago which got considerably worse yesterday -Denies any chest pain, denies cough  Objective: Vitals:   12/07/18 0930 12/07/18 1030 12/07/18 1130 12/07/18 1200  BP: 112/81 97/71 104/82 110/88  Pulse: 94 93 94 (!) 104  Resp: (!) _0 (!) 22  Temp:      TempSrc:      SpO2: 100% 95% 95% 99%    Intake/Output Summary (Last 24 hours) at 12/07/2018 1210 Last data filed at 12/07/2018 1054 Gross per 24 hour  Intake --  Output 1000 ml  Net -1000 ml   There were no vitals filed for this visit.  Examination:  General exam: Morbidly obese Respiratory system: Few basilar crackles especially on the right Cardiovascular system: S1 & S2 heard, RRR. Gastrointestinal system: Abdomen is nondistended, soft and nontender.Normal bowel sounds heard. Central nervous system: Alert and oriented. No focal neurological deficits. Extremities: Trace edema Skin: No rashes, lesions or ulcers Psychiatry:  Mood & affect appropriate.     Data Reviewed:   CBC: Recent Labs  Lab 12/06/18 1518 12/07/18 0517  WBC 4.8 5.2  HGB 9.7* 8.6*  HCT 31.5* 28.1*  MCV 97.2 99.6  PLT PLATELET CLUMPS NOTED ON SMEAR, COUNT APPEARS DECREASED 390    Basic Metabolic Panel: Recent Labs  Lab 12/06/18 1518 12/07/18 0517  NA 140 141  K 4.8 3.7  CL 101 102  CO2 27 31  GLUCOSE 79 87  BUN 25* 22  CREATININE 1.27* 1.24*  CALCIUM 8.8* 8.5*  MG  --  1.4*   GFR: Estimated Creatinine Clearance: 57.5 mL/min (A) (by C-G formula based on SCr of 1.24 mg/dL (H)). Liver Function Tests: No results for input(s): AST, ALT, ALKPHOS, BILITOT, PROT, ALBUMIN in the last 168 hours. No results for input(s): LIPASE, AMYLASE in the last 168 hours. No results for input(s): AMMONIA in the last 168 hours. Coagulation Profile: No results for input(s): INR, PROTIME in the last 168 hours. Cardiac Enzymes: No results for input(s): CKTOTAL, CKMB, CKMBINDEX, TROPONINI in the last 168 hours. BNP (last 3 results) No results for input(s): PROBNP in the last 8760 hours. HbA1C: No results for input(s): HGBA1C in the last 72 hours. CBG: No results for input(s): GLUCAP in the last 168 hours. Lipid Profile: No results for input(s): CHOL, HDL, LDLCALC, TRIG, CHOLHDL, LDLDIRECT in the last 72 hours. Thyroid Function Tests: No results for input(s): TSH, T4TOTAL, FREET4, T3FREE, THYROIDAB in the last 72 hours. Anemia Panel: No results for input(s): VITAMINB12, FOLATE, FERRITIN, TIBC, IRON, RETICCTPCT in the last 72 hours. Urine analysis:    Component Value Date/Time   COLORURINE YELLOW 07/31/2018 0657   APPEARANCEUR HAZY (A) 07/31/2018 0657   LABSPEC 1.015 07/31/2018 0657   PHURINE 5.0 07/31/2018 0657   GLUCOSEU NEGATIVE 07/31/2018 0657   HGBUR MODERATE (A) 07/31/2018 0657   BILIRUBINUR NEGATIVE 07/31/2018 0657   KETONESUR NEGATIVE 07/31/2018 0657   PROTEINUR NEGATIVE 07/31/2018 0657   UROBILINOGEN 1.0 06/06/2013 2154   NITRITE POSITIVE (A) 07/31/2018 0657   LEUKOCYTESUR SMALL (A) 07/31/2018 0657   Sepsis Labs: _0 (procalcitonin:4,lacticidven:4)  ) Recent Results (from the past 240 hour(s))  SARS CORONAVIRUS 2 (TAT 6-24 HRS) Nasopharyngeal  Nasopharyngeal Swab     Status: None   Collection Time: 11/27/18 12:35 PM   Specimen: Nasopharyngeal Swab  Result Value Ref Range Status   SARS Coronavirus 2 NEGATIVE NEGATIVE Final    Comment: (NOTE) SARS-CoV-2 target nucleic acids are NOT DETECTED. The SARS-CoV-2 RNA is generally detectable in upper and lower respiratory specimens during the acute phase of infection. Negative results do not preclude SARS-CoV-2 infection, do not rule out co-infections with other pathogens, and should not be used as the sole basis for treatment or other patient management decisions. Negative results must be combined with clinical observations, patient history, and epidemiological information. The expected result is Negative. Fact Sheet for Patients: SugarRoll.be Fact Sheet for Healthcare Providers: https://www.woods-mathews.com/ This test is not yet approved or cleared by the [place] FDA and  has been authorized for detection and/or diagnosis of SARS-CoV-2 by FDA under an Emergency Use Authorization (EUA). This EUA will remain  in effect (meaning this test can be used) for the duration of the COVID-19 declaration under Section 56 4(b)(1) of the Act, 21 U.S.C. section 360bbb-3(b)(1), unless the authorization is terminated or revoked sooner. Performed at [facility], [place] [address], [place], [place] [address]   SARS CORONAVIRUS 2 (TAT 6-24 HRS) Nasopharyngeal Nasopharyngeal Swab     Status: None   Collection Time: 12/07/18  1:39 AM   Specimen: Nasopharyngeal Swab  Result Value Ref  Range Status   SARS Coronavirus 2 NEGATIVE NEGATIVE Final    Comment: (NOTE) SARS-CoV-2 target nucleic acids are NOT DETECTED. The SARS-CoV-2 RNA is generally detectable in upper and lower respiratory specimens during the acute phase of infection. Negative results do not preclude SARS-CoV-2 infection, do not rule out co-infections with other pathogens, and should not  be used as the sole basis for treatment or other patient management decisions. Negative results must be combined with clinical observations, patient history, and epidemiological information. The expected result is Negative. Fact Sheet for Patients: SugarRoll.be Fact Sheet for Healthcare Providers: https://www.woods-mathews.com/ This test is not yet approved or cleared by the [place] FDA and  has been authorized for detection and/or diagnosis of SARS-CoV-2 by FDA under an Emergency Use Authorization (EUA). This EUA will remain  in effect (meaning this test can be used) for the duration of the COVID-19 declaration under Section 56 4(b)(1) of the Act, 21 U.S.C. section 360bbb-3(b)(1), unless the authorization is terminated or revoked sooner. Performed at [facility], [place] [address] [place], [place] [address]          Radiology Studies: Dg Chest 2 View  Result Date: 12/06/2018 CLINICAL DATA:  Chest pain a few days ago.  Shortness of breath. EXAM: CHEST - 2 VIEW COMPARISON:  None. FINDINGS: Stable cardiomegaly. The hila and mediastinum are normal. No pneumothorax. No nodules or masses. No focal infiltrates or overt edema. IMPRESSION: No active cardiopulmonary disease. Electronically Signed   By: Dorise Bullion III M.D   On: 12/06/2018 15:53   Ct Angio Chest Pe W And/or Wo Contrast  Result Date: 12/06/2018 CLINICAL DATA:  PE suspected, low pretest probability EXAM: CT ANGIOGRAPHY CHEST WITH CONTRAST TECHNIQUE: Multidetector CT imaging of the chest was performed using the standard protocol during bolus administration of intravenous contrast. Multiplanar CT image reconstructions and MIPs were obtained to evaluate the vascular anatomy. CONTRAST:  165m OMNIPAQUE IOHEXOL 350 MG/ML SOLN COMPARISON:  Radiograph 12/06/2018, CTA 09/21/2017 FINDINGS: Cardiovascular: Satisfactory opacification of the pulmonary arteries to the segmental level.  No evidence of pulmonary embolism. Marked enlargement of the central pulmonary arteries is similar to comparison study. No CT evidence of right heart strain. No elevation of the RV/LV ratio (0.65). Mild cardiomegaly with biatrial enlargement, right greater than left. There is reflux of contrast into the hepatic veins. Small amount of contrast material within the right atrium likely related to intravenous access. Aorta is normal caliber. Shared origin of the brachiocephalic and left common carotid artery. Proximal great vessels are unremarkable. There is distention of the azygos and hemi azygous veins. Mediastinum/Nodes: No enlarged mediastinal or axillary lymph nodes. Thyroid gland, trachea, and esophagus demonstrate no significant findings. Lungs/Pleura: Areas of mosaic attenuation within the lungs may reflect changes seen with imaging during exhalation. Ground-glass opacity seen posteriorly in the right upper lobe could reflect additional volume loss or a developing airspace process. More bandlike opacities in the right lung base may reflect scarring given presence comparison study from 2019. No pneumothorax. No effusion. No suspicious pulmonary nodules or masses. Upper Abdomen: Extensive postsurgical changes at the diaphragmatic hiatus compatible with history of gastroplasty and gastric bypass. Patient is post cholecystectomy. There is notable fluid distention of the duodenal sweep, nonspecific given the postsurgical changes. Nonobstructing calcifications are present in the interpolar and lower pole right kidney. Musculoskeletal: Multilevel degenerative changes are present in the imaged portions of the spine. Chest wall deformity is similar to comparison. Superior endplate changes of TH0-Q6and inferior endplate changes  of T5 are unchanged from comparison study.No acute osseous abnormality or suspicious osseous lesion. No suspicious chest wall lesions. Postsurgical changes from right shoulder reverse arthroplasty.  Intra-articular body noted in the posterior recess of the left glenohumeral joint. Review of the MIP images confirms the above findings. IMPRESSION: 1. No evidence of pulmonary embolism. 2. Chronic central pulmonary arterial enlargement compatible with pulmonary artery hypertension. 3. Mild cardiomegaly with biatrial enlargement, right greater than left. 4. Reflux of contrast into the hepatic veins, suggestive of elevated right heart pressures. Distention of the azygos and hemi azygous veins suggesting suggest increased preload. 5. Ground-glass opacity posteriorly in the right upper lobe could reflect volume loss or a developing airspace process. Difficult to discern given imaging during exhalation. 6. Extensive postsurgical changes at the diaphragmatic hiatus compatible with history of gastroplasty and gastric bypass. There is notable fluid distention of the duodenal sweep, nonspecific given the postsurgical changes. 7. Nonobstructing right nephrolithiasis. 8. Right shoulder hemiarthroplasty.  No acute complication. 9. Intra-articular body of the left shoulder recess. Stable from prior. Electronically Signed   By: Lovena Le M.D.   On: 12/06/2018 23:18        Scheduled Meds:  apixaban  5 mg Oral BID   calcitRIOL  0.5 mcg Oral BID   DULoxetine  60 mg Oral Daily   fluorometholone  1 drop Both Eyes QID   furosemide  40 mg Intravenous Q12H   levETIRAcetam  500 mg Oral BID   levothyroxine  200 mcg Oral QAC breakfast   magnesium oxide  400 mg Oral Daily   metoprolol succinate  25 mg Oral BID   mirabegron ER  50 mg Oral Daily   senna  2 tablet Oral Daily   sodium chloride flush  3 mL Intravenous Once   traZODone  50 mg Oral QHS   Continuous Infusions:   LOS: 0 days    Time spent: 13mn    PDomenic Polite MD [facility]  12/07/2018, 12:10 PM

## 2018-12-07 NOTE — ED Provider Notes (Signed)
68 year old female pending admission received from Dr. Regan Lemming, ER resident, under the supervision of Dr. Sherry Ruffing.  Please see his note for further work-up and medical decision making.  Spoke with Dr. Blaine Hamper, hospitalist, who will accept the patient for admission.    Joanne Gavel, PA-C 12/07/18 0309    Tegeler, Gwenyth Allegra, MD 12/07/18 1022

## 2018-12-07 NOTE — ED Notes (Signed)
Pt requests something light for lunch, will call kitchen to address.  Does have some nausea at this time.  Alert without acute distress noted.  Talkative full sentences without noted dyspnea.

## 2018-12-07 NOTE — ED Notes (Signed)
Pt given [place] sandwich, cheese, crackers, and ice water- per diet order from admitting MD

## 2018-12-07 NOTE — ED Notes (Signed)
Pt. Stated, Im suppose to wait 30 minutes before taking my other medication after the synthyroid

## 2018-12-07 NOTE — Progress Notes (Signed)
Patients heart rate elevated as high as 168 on ambulation and quickly lowered back to 90's after patient sat back on the bed. BP 107/66. Denies any chest pain. Continues to have SOB with exertion.

## 2018-12-08 ENCOUNTER — Inpatient Hospital Stay (HOSPITAL_COMMUNITY): Payer: Medicare Other

## 2018-12-08 ENCOUNTER — Encounter (HOSPITAL_COMMUNITY): Payer: Self-pay

## 2018-12-08 DIAGNOSIS — I361 Nonrheumatic tricuspid (valve) insufficiency: Secondary | ICD-10-CM

## 2018-12-08 LAB — ECHOCARDIOGRAM COMPLETE
Height: 66 in
Weight: 3573.22 oz

## 2018-12-08 LAB — BASIC METABOLIC PANEL
Anion gap: 13 (ref 5–15)
BUN: 21 mg/dL (ref 8–23)
CO2: 30 mmol/L (ref 22–32)
Calcium: 8.3 mg/dL — ABNORMAL LOW (ref 8.9–10.3)
Chloride: 98 mmol/L (ref 98–111)
Creatinine, Ser: 1.18 mg/dL — ABNORMAL HIGH (ref 0.44–1.00)
GFR calc Af Amer: 56 mL/min — ABNORMAL LOW (ref >60)
GFR calc non Af Amer: 48 mL/min — ABNORMAL LOW (ref >60)
Glucose, Bld: 86 mg/dL (ref 70–99)
Potassium: 3.3 mmol/L — ABNORMAL LOW (ref 3.5–5.1)
Sodium: 141 mmol/L (ref 135–145)

## 2018-12-08 LAB — CBC
HCT: 28.3 % — ABNORMAL LOW (ref 36.0–46.0)
Hemoglobin: 8.8 g/dL — ABNORMAL LOW (ref 12.0–15.0)
MCH: 30.6 pg (ref 26.0–34.0)
MCHC: 31.1 g/dL (ref 30.0–36.0)
MCV: 98.3 fL (ref 80.0–100.0)
Platelets: 317 10*3/uL (ref 150–400)
RBC: 2.88 MIL/uL — ABNORMAL LOW (ref 3.87–5.11)
RDW: 15.4 % (ref 11.5–15.5)
WBC: 4.8 10*3/uL (ref 4.0–10.5)
nRBC: 0 % (ref 0.0–0.2)

## 2018-12-08 MED ORDER — POTASSIUM CHLORIDE CRYS ER 20 MEQ PO TBCR
40.0000 meq | EXTENDED_RELEASE_TABLET | Freq: Two times a day (BID) | ORAL | Status: DC
  Administered 2018-12-08: 40 meq via ORAL
  Filled 2018-12-08: qty 2

## 2018-12-08 MED ORDER — FUROSEMIDE 10 MG/ML IJ SOLN
20.0000 mg | Freq: Two times a day (BID) | INTRAMUSCULAR | Status: DC
  Administered 2018-12-08 – 2018-12-09 (×2): 20 mg via INTRAVENOUS
  Filled 2018-12-08 (×2): qty 2

## 2018-12-08 MED ORDER — POTASSIUM CHLORIDE CRYS ER 20 MEQ PO TBCR
40.0000 meq | EXTENDED_RELEASE_TABLET | Freq: Three times a day (TID) | ORAL | Status: AC
  Administered 2018-12-08: 40 meq via ORAL
  Filled 2018-12-08 (×2): qty 2

## 2018-12-08 NOTE — Progress Notes (Signed)
PROGRESS NOTE    Jillian Eaton  FBP:[id number] DOB: 05-03-1950 DOA: 12/06/2018 PCP: [name], MD  Brief Narrative: Jillian Eaton is a 68 y.o. female with medical history significant of A fib on Eliquis, hypertension, asthma, hypothyroidism, depression, small bowel obstruction, CHF, pulmonary hypertension, fibromyalgia, kidney stone, OSA not on CPAP, thrombocytopenia, CKD stage III, atrial fibrillation on Eliquis, who presented with shortness of breath. -had left knee TKA three weeks ago and has had mild shortness of breath since the procedure. She reports sudden onset worsening shortness of breath since yesterday afternoon.  -ED Course: -CT angiogram is negative for PE, but showed some groundglass opacity in the right upper lobe, pulmonary arterial hypertension, reflux of contrast into the hepatic vein suggestive of elevated right heart pressures   Assessment & Plan:   Acute on chronic respiratory failure with hypoxia Acute on chronic diastolic CHF, pulmonary hypertension -CTA negative for PE -Dyspnea progressive since recent hospitalization 3 weeks ago for knee arthroplasty, clinically volume overloaded, -Used to improve slowly with diuresis, not back to baseline yet -She is -3 L will continue IV Lasix today, decrease dose to 20 mg every 12 -CT notes question mild groundglass opacity in the right upper lobe, no symptoms of pneumonia could be fluid related, COVID-19 PCR is negative -Apparently has home oxygen but has not been using this and takes Lasix PRN 20 to 40 mg daily at baseline -Monitor B met closely  Acute on chronic diastolic CHF (congestive heart failure) and Pulmonary HTN  -see above  Essential hypertension: -Continue metoprolol  Chronic atrial fibrillation ([place]): CHA2DS2-VASc Score is 4,  -Continue Eliquis and metoprolol  hx of Thrombocytopenia ([place]):  -Monitor  Chronic asthmatic bronchitis ([place]):  -Continue PRN  albuterol  Depressive disorder:   Acute renal failure superimposed on stage 3 chronic kidney disease ([place]): Baseline creatinine around 1.0.  Her creatinine is 1.27, -Suspect cardiorenal component, improving with diuresis, down to 1.1 now  Hypothyroidism: -Synthroid  Iron deficiency anemia and chronic disease -Given IV iron yesterday, add oral iron at discharge  Morbid obesity, BMI is 39 -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  DVT ppx: On Eliquis Code Status: Full code Family Communication: None at bed side.    Disposition Plan:   Home tomorrow if stable  Consultants:      Procedures:   Antimicrobials:    Subjective: -Feels like her breathing is starting to improve finally, reports getting closer to her baseline but not there yet, did have an episode of transient chest pain last night  objective: Vitals:   12/08/18 0227 12/08/18 0519 12/08/18 0849 12/08/18 1113  BP: 93/72 105/65 99/70   Pulse: 77 78 82   Resp: 16     Temp: 98.4 F (36.9 C) 98.4 F (36.9 C)    TempSrc: Oral Oral    SpO2: 94% 93%  94%  Weight:  101.3 kg    Height:        Intake/Output Summary (Last 24 hours) at 12/08/2018 1243 Last data filed at 12/08/2018 0900 Gross per 24 hour  Intake 240 ml  Output 1350 ml  Net -1110 ml   Filed Weights   12/07/18 1437 12/08/18 0519  Weight: 113.1 kg 101.3 kg    Examination:  Gen: Really obese chronically ill female sitting up in bed AAO x3, no distress HEENT: PERRLA, Neck supple, no JVD Lungs: Improved air movement, trace basilar rails in the right CVS: RRR,No Gallops,Rubs or new Murmurs Abd: soft, Non tender,  non distended, BS present Extremities: No edema Skin: no new rashes Psychiatry:  Mood & affect appropriate.     Data Reviewed:   CBC: Recent Labs  Lab 12/06/18 1518 12/07/18 0517 12/08/18 0307  WBC 4.8 5.2 4.8  HGB 9.7* 8.6* 8.8*  HCT 31.5* 28.1* 28.3*  MCV 97.2 99.6 98.3  PLT PLATELET CLUMPS NOTED ON  SMEAR, COUNT APPEARS DECREASED 316 786   Basic Metabolic Panel: Recent Labs  Lab 12/06/18 1518 12/07/18 0517 12/08/18 0307  NA 140 141 141  K 4.8 3.7 3.3*  CL 101 102 98  CO2 _0 GLUCOSE 79 87 86  BUN 25* 22 21  CREATININE 1.27* 1.24* 1.18*  CALCIUM 8.8* 8.5* 8.3*  MG  --  1.4*  --    GFR: Estimated Creatinine Clearance: 56.3 mL/min (A) (by C-G formula based on SCr of 1.18 mg/dL (H)). Liver Function Tests: No results for input(s): AST, ALT, ALKPHOS, BILITOT, PROT, ALBUMIN in the last 168 hours. No results for input(s): LIPASE, AMYLASE in the last 168 hours. No results for input(s): AMMONIA in the last 168 hours. Coagulation Profile: No results for input(s): INR, PROTIME in the last 168 hours. Cardiac Enzymes: No results for input(s): CKTOTAL, CKMB, CKMBINDEX, TROPONINI in the last 168 hours. BNP (last 3 results) No results for input(s): PROBNP in the last 8760 hours. HbA1C: No results for input(s): HGBA1C in the last 72 hours. CBG: No results for input(s): GLUCAP in the last 168 hours. Lipid Profile: No results for input(s): CHOL, HDL, LDLCALC, TRIG, CHOLHDL, LDLDIRECT in the last 72 hours. Thyroid Function Tests: No results for input(s): TSH, T4TOTAL, FREET4, T3FREE, THYROIDAB in the last 72 hours. Anemia Panel: Recent Labs    12/07/18 1515 12/07/18 1516  VITAMINB12 1,394*  --   FOLATE  --  11.9  FERRITIN 65  --   TIBC 329  --   IRON 31  --   RETICCTPCT 2.0  --    Urine analysis:    Component Value Date/Time   COLORURINE YELLOW 07/31/2018 0657   APPEARANCEUR HAZY (A) 07/31/2018 0657   LABSPEC 1.015 07/31/2018 0657   PHURINE 5.0 07/31/2018 0657   GLUCOSEU NEGATIVE 07/31/2018 0657   HGBUR MODERATE (A) 07/31/2018 0657   BILIRUBINUR NEGATIVE 07/31/2018 [place] 07/31/2018 0657   PROTEINUR NEGATIVE 07/31/2018 0657   UROBILINOGEN 1.0 06/06/2013 2154   NITRITE POSITIVE (A) 07/31/2018 0657   LEUKOCYTESUR SMALL (A) 07/31/2018 0657    Sepsis Labs: _1 (procalcitonin:4,lacticidven:4)  ) Recent Results (from the past 240 hour(s))  SARS CORONAVIRUS 2 (TAT 6-24 HRS) Nasopharyngeal Nasopharyngeal Swab     Status: None   Collection Time: 12/07/18  1:39 AM   Specimen: Nasopharyngeal Swab  Result Value Ref Range Status   SARS Coronavirus 2 NEGATIVE NEGATIVE Final    Comment: (NOTE) SARS-CoV-2 target nucleic acids are NOT DETECTED. The SARS-CoV-2 RNA is generally detectable in upper and lower respiratory specimens during the acute phase of infection. Negative results do not preclude SARS-CoV-2 infection, do not rule out co-infections with other pathogens, and should not be used as the sole basis for treatment or other patient management decisions. Negative results must be combined with clinical observations, patient history, and epidemiological information. The expected result is Negative. Fact Sheet for Patients: SugarRoll.be Fact Sheet for Healthcare Providers: https://www.woods-mathews.com/ This test is not yet approved or cleared by the [place] FDA and  has been authorized for detection and/or diagnosis of SARS-CoV-2 by FDA under an Emergency Use  Authorization (EUA). This EUA will remain  in effect (meaning this test can be used) for the duration of the COVID-19 declaration under Section 56 4(b)(1) of the Act, 21 U.S.C. section 360bbb-3(b)(1), unless the authorization is terminated or revoked sooner. Performed at [facility], [place] [address] [place], [place] [address]          Radiology Studies: Dg Chest 2 View  Result Date: 12/06/2018 CLINICAL DATA:  Chest pain a few days ago.  Shortness of breath. EXAM: CHEST - 2 VIEW COMPARISON:  None. FINDINGS: Stable cardiomegaly. The hila and mediastinum are normal. No pneumothorax. No nodules or masses. No focal infiltrates or overt edema. IMPRESSION: No active cardiopulmonary disease. Electronically  Signed   By: Dorise Bullion III M.D   On: 12/06/2018 15:53   Ct Angio Chest Pe W And/or Wo Contrast  Result Date: 12/06/2018 CLINICAL DATA:  PE suspected, low pretest probability EXAM: CT ANGIOGRAPHY CHEST WITH CONTRAST TECHNIQUE: Multidetector CT imaging of the chest was performed using the standard protocol during bolus administration of intravenous contrast. Multiplanar CT image reconstructions and MIPs were obtained to evaluate the vascular anatomy. CONTRAST:  123m OMNIPAQUE IOHEXOL 350 MG/ML SOLN COMPARISON:  Radiograph 12/06/2018, CTA 09/21/2017 FINDINGS: Cardiovascular: Satisfactory opacification of the pulmonary arteries to the segmental level. No evidence of pulmonary embolism. Marked enlargement of the central pulmonary arteries is similar to comparison study. No CT evidence of right heart strain. No elevation of the RV/LV ratio (0.65). Mild cardiomegaly with biatrial enlargement, right greater than left. There is reflux of contrast into the hepatic veins. Small amount of contrast material within the right atrium likely related to intravenous access. Aorta is normal caliber. Shared origin of the brachiocephalic and left common carotid artery. Proximal great vessels are unremarkable. There is distention of the azygos and hemi azygous veins. Mediastinum/Nodes: No enlarged mediastinal or axillary lymph nodes. Thyroid gland, trachea, and esophagus demonstrate no significant findings. Lungs/Pleura: Areas of mosaic attenuation within the lungs may reflect changes seen with imaging during exhalation. Ground-glass opacity seen posteriorly in the right upper lobe could reflect additional volume loss or a developing airspace process. More bandlike opacities in the right lung base may reflect scarring given presence comparison study from 2019. No pneumothorax. No effusion. No suspicious pulmonary nodules or masses. Upper Abdomen: Extensive postsurgical changes at the diaphragmatic hiatus compatible with  history of gastroplasty and gastric bypass. Patient is post cholecystectomy. There is notable fluid distention of the duodenal sweep, nonspecific given the postsurgical changes. Nonobstructing calcifications are present in the interpolar and lower pole right kidney. Musculoskeletal: Multilevel degenerative changes are present in the imaged portions of the spine. Chest wall deformity is similar to comparison. Superior endplate changes of TU9-N2and inferior endplate changes of T5 are unchanged from comparison study.No acute osseous abnormality or suspicious osseous lesion. No suspicious chest wall lesions. Postsurgical changes from right shoulder reverse arthroplasty. Intra-articular body noted in the posterior recess of the left glenohumeral joint. Review of the MIP images confirms the above findings. IMPRESSION: 1. No evidence of pulmonary embolism. 2. Chronic central pulmonary arterial enlargement compatible with pulmonary artery hypertension. 3. Mild cardiomegaly with biatrial enlargement, right greater than left. 4. Reflux of contrast into the hepatic veins, suggestive of elevated right heart pressures. Distention of the azygos and hemi azygous veins suggesting suggest increased preload. 5. Ground-glass opacity posteriorly in the right upper lobe could reflect volume loss or a developing airspace process. Difficult to discern given imaging during exhalation. 6. Extensive postsurgical changes  at the diaphragmatic hiatus compatible with history of gastroplasty and gastric bypass. There is notable fluid distention of the duodenal sweep, nonspecific given the postsurgical changes. 7. Nonobstructing right nephrolithiasis. 8. Right shoulder hemiarthroplasty.  No acute complication. 9. Intra-articular body of the left shoulder recess. Stable from prior. Electronically Signed   By: Lovena Le M.D.   On: 12/06/2018 23:18        Scheduled Meds:  apixaban  5 mg Oral BID   calcitRIOL  0.5 mcg Oral BID    DULoxetine  60 mg Oral Daily   fluorometholone  1 drop Both Eyes QID   furosemide  20 mg Intravenous Q12H   levETIRAcetam  500 mg Oral BID   levothyroxine  200 mcg Oral QAC breakfast   magnesium oxide  400 mg Oral Daily   metoprolol succinate  25 mg Oral BID   mirabegron ER  50 mg Oral Daily   potassium chloride  40 mEq Oral TID   senna  2 tablet Oral Daily   sodium chloride flush  3 mL Intravenous Once   traZODone  50 mg Oral QHS   Continuous Infusions:   LOS: 1 day    Time spent: 76mn    PDomenic Polite MD [facility]  12/08/2018, 12:43 PM

## 2018-12-08 NOTE — TOC Initial Note (Signed)
Transition of Care [facility]) - Initial/Assessment Note    Patient Details  Name: Jillian Eaton MRN: [id number] Date of Birth: 1950-07-21  Transition of [facility]) CM/SW Contact:    Carles Collet, RN Phone Number: 12/08/2018, 12:05 PM  Clinical Narrative:               The patient is coming from home.  At baseline, pt is independent for most of ADL. Admitted with SOB. TOC will continue to follow this high readmission risk patient for DC planning.      Expected Discharge Plan: (TBD) Barriers to Discharge: Continued Medical Work up   Patient Goals and CMS Choice        Expected Discharge Plan and Services Expected Discharge Plan: (TBD)                                              Prior Living Arrangements/Services                       Activities of Daily Living Home Assistive Devices/Equipment: Bedside commode/3-in-1, Walker (specify type), Eyeglasses ADL Screening (condition at time of admission) Patient's cognitive ability adequate to safely complete daily activities?: Yes Is the patient deaf or have difficulty hearing?: No Does the patient have difficulty seeing, even when wearing glasses/contacts?: No Does the patient have difficulty concentrating, remembering, or making decisions?: No Patient able to express need for assistance with ADLs?: Yes Does the patient have difficulty dressing or bathing?: No Independently performs ADLs?: Yes (appropriate for developmental age) Does the patient have difficulty walking or climbing stairs?: Yes Weakness of Legs: Left Weakness of Arms/Hands: None  Permission Sought/Granted                  Emotional Assessment              Admission diagnosis:  SOB (shortness of breath) [R06.02] Pulmonary HTN (HCC) [I27.20] Acute on chronic respiratory failure with hypoxia ([place]) [J96.21] Patient Active Problem List   Diagnosis Date Noted  . Acute on chronic respiratory failure with hypoxia ([place])  12/07/2018  . Hypothyroidism 12/07/2018  . Normocytic anemia 12/07/2018  . Asthma 12/07/2018  . S/P left TKA 11/14/2018  . Status post total left knee replacement 11/14/2018  . Degenerative joint disease involving multiple joints on both sides of body 07/31/2018  . Rotator cuff tear arthropathy of right shoulder 04/05/2018  . Overactive bladder 04/05/2018  . Steroid-induced hyperglycemia   . Generalized OA   . 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 NICM (nonischemic cardiomyopathy) ([place]) 01/15/2018  . Osteoporosis 12/13/2017  . Rotator cuff tear arthropathy 11/29/2017  . Chronic diastolic CHF (congestive heart failure) ([place]) 09/28/2017  . Acute renal failure superimposed on stage 3 chronic kidney disease ([place]) 09/07/2017  . Hypomagnesemia with secondary hypocalcemia 09/07/2017  . 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 Hypercapnemia 07/13/2017  . AKI (acute kidney injury) ([place]) 07/10/2017  . Vertigo 07/08/2017  . Blurring of visual image 07/08/2017  . On anticoagulant therapy 06/19/2017  . Chronic respiratory failure with hypoxia ([place]) 04/19/2017  . GERD (gastroesophageal reflux disease) 08/28/2016  . 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 Depressive disorder 08/28/2016  . Iatrogenic hypocalcemia 08/28/2016  . Chronic pain syndrome   . Acute diastolic (congestive) heart failure ([place])   . Oropharyngeal dysphagia   . Chronic atrial fibrillation ([place]) 04/28/2016  .  Vocal cord dysfunction 04/28/2016  . Thrombocytopenia ([place]) 04/28/2016  . Seizures ([place])   . Preoperative cardiovascular examination   . 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  . Leg swelling 10/19/2015  . Acute on chronic respiratory failure with hypoxia and hypercapnia ([place]) 08/03/2015  . Bilateral leg edema 05/11/2015  . Essential hypertension 05/11/2015  . Nephrolithiasis 04/12/2015  . Numbness in both hands 04/12/2015  . Complete tear of left rotator cuff 11/19/2014  . Primary  osteoarthritis of both knees 03/24/2014  . Chronic asthmatic bronchitis ([place]) 07/30/2013  . Bariatric surgery status 07/30/2013  . 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 Urge incontinence of urine 07/30/2013  . Vitamin D deficiency 07/30/2013  . B-complex deficiency 07/30/2013  . Cough 07/30/2013  . Gross hematuria 07/30/2013  . Goiter 07/30/2013  . Palpitations 07/30/2013  . Right flank pain 07/30/2013  . SBO (small bowel obstruction) ([place]) 06/07/2013  . Pulmonary HTN ([place]) 01/08/2013  . Insomnia 03/22/2012  . Mild intermittent asthma with acute exacerbation 03/22/2012  . Fibromyalgia 10/26/2011  . Anemia of chronic disease 03/28/2011  . Morbid obesity with body mass index of 40.0-49.9 ([place]) 09/22/2010   PCP:  [name], MD Pharmacy:   [id number] [place], [place] AT [address] [place] [address] Phone: [phone] Fax: [phone]  [facility] [place] [place] [facility] [address] [place] [address] Phone: [phone] Fax: [phone]  [facility] [address] [place] [address] Phone: [phone] Fax: [phone]  [id number] [place], [place] [address] AT [address] [place] [address] Phone: [phone] Fax: [phone]     Social Determinants of Health ([place]) Interventions    Readmission Risk Interventions Readmission Risk Prevention Plan 05/30/2018 05/28/2018  Transportation Screening - Complete  PCP or Specialist Appt within 3-5 Days Complete -  HRI or [place] - Complete  Social Work Consult for [place] Planning/Counseling - Not Complete  SW consult not completed comments - no need  Palliative Care Screening - Not Applicable  Medication  Review (RN Care Manager) Referral to Pharmacy -  Some recent data might be hidden

## 2018-12-08 NOTE — Progress Notes (Signed)
  Echocardiogram 2D Echocardiogram has been performed.  Jillian Eaton L Androw 12/08/2018, 4:14 PM

## 2018-12-09 LAB — CBC
HCT: 29 % — ABNORMAL LOW (ref 36.0–46.0)
Hemoglobin: 9.1 g/dL — ABNORMAL LOW (ref 12.0–15.0)
MCH: 30.3 pg (ref 26.0–34.0)
MCHC: 31.4 g/dL (ref 30.0–36.0)
MCV: 96.7 fL (ref 80.0–100.0)
Platelets: 303 10*3/uL (ref 150–400)
RBC: 3 MIL/uL — ABNORMAL LOW (ref 3.87–5.11)
RDW: 15.1 % (ref 11.5–15.5)
WBC: 5.4 10*3/uL (ref 4.0–10.5)
nRBC: 0 % (ref 0.0–0.2)

## 2018-12-09 LAB — BASIC METABOLIC PANEL
Anion gap: 12 (ref 5–15)
BUN: 23 mg/dL (ref 8–23)
CO2: 32 mmol/L (ref 22–32)
Calcium: 8.4 mg/dL — ABNORMAL LOW (ref 8.9–10.3)
Chloride: 98 mmol/L (ref 98–111)
Creatinine, Ser: 1.26 mg/dL — ABNORMAL HIGH (ref 0.44–1.00)
GFR calc Af Amer: 51 mL/min — ABNORMAL LOW (ref >60)
GFR calc non Af Amer: 44 mL/min — ABNORMAL LOW (ref >60)
Glucose, Bld: 94 mg/dL (ref 70–99)
Potassium: 3.7 mmol/L (ref 3.5–5.1)
Sodium: 142 mmol/L (ref 135–145)

## 2018-12-09 MED ORDER — POTASSIUM CHLORIDE CRYS ER 20 MEQ PO TBCR: 40.0000 meq | EXTENDED_RELEASE_TABLET | Freq: Every day | ORAL | 0 refills | Status: DC | PRN

## 2018-12-09 NOTE — Discharge Summary (Signed)
Physician Discharge Summary  Jillian Eaton [facility] [id number] DOB: 1950/05/29 DOA: 12/06/2018  PCP: [name], MD  Admit date: 12/06/2018 Discharge date: 12/09/2018  Time spent: 35 minutes  Recommendations for Outpatient Follow-up:  1. PCP in 1 week 2. Cardiology Dr. [name] in 2 to 3 weeks, please assess volume status and titrate diuretics   Discharge Diagnoses:  Principal Problem:   Acute on chronic respiratory failure with hypoxia (HCC) Acute on chronic diastolic CHF History of pulmonary HTN (HCC)   Essential hypertension   Chronic atrial fibrillation (HCC)   Thrombocytopenia (HCC)   Chronic asthmatic bronchitis (HCC)   Depressive disorder   Chronic diastolic CHF (congestive heart failure) (HCC)   Acute renal failure superimposed on stage 3 chronic kidney disease ([place])   Hypothyroidism   Normocytic anemia   Asthma   Discharge Condition: Improved  Diet recommendation: Low-sodium heart healthy  Filed Weights   12/07/18 1437 12/08/18 0519 12/09/18 0455  Weight: 113.1 kg 101.3 kg 99.5 kg    History of present illness:  Jillian Eaton a 68 y.o.femalewith medical history significant ofA fib on Eliquis,hypertension, asthma, hypothyroidism, depression, small bowel obstruction, CHF, pulmonary hypertension, fibromyalgia, kidney stone, OSA not on CPAP, thrombocytopenia, CKD stage III, atrial fibrillation on Eliquis, who presented with shortness of breath. -had left knee TKAthree weeks ago and has had mild shortness of breath since the procedure. She reports sudden onset worseningshortness of breathsince 10/15afternoon.  -ED Course:-CT angiogram is negative for PE, but showed some groundglass opacity in the right upper lobe, pulmonary arterial hypertension, reflux of contrast into the hepatic vein suggestive of elevated right heart pressures  Hospital Course:   Acute on chronic respiratory failure with hypoxia Acute on chronic diastolic CHF,  pulmonary hypertension -CTA admission was negative for PE -Dyspnea progressive since recent hospitalization 3 weeks ago for knee arthroplasty, patient had been doing remarkably well prior to her knee surgery and only required Lasix occasionally as needed, suspect she developed volume overload 3 weeks ago during her hospitalization for knee arthroplasty, she was clinically volume overloaded on admission, improved considerably with diuresis - 4 L negative at the time of discharge, was weaned off oxygen -CT notes question mild groundglass opacity in the right upper lobe, no symptoms of pneumonia could be fluid related, COVID-19 PCR is negative -Since her fluid overload could have been partly iatrogenic from recent hospitalization and patient is extremely careful regarding diet and fluid, she will be discharged home on Lasix daily as needed, strictly advised to monitor her weight and for swelling and call the cardiologist office if she develops weight gain or swelling she is advised to take potassium whenever she takes Lasix. -Follow-up with Dr. [name] in 2 to 3 weeks -Repeat echo this admission showed preserved EF, normal wall motion, diastolic dysfunction and improved RV pressures  Acute on chronic diastolic CHF (congestive heart failure)andPulmonary HTN -see above  Essential hypertension: -Continue metoprolol  Chronic atrial fibrillation (HCC):CHA2DS2-VASc Scoreis 4,  -Continue Eliquis and metoprolol  hx ofThrombocytopenia ([place]): -Monitor  Chronic asthmatic bronchitis ([place]): -Continue PRN albuterol  Depressive disorder:   Acute renal failure superimposed on stage 2 chronic kidney disease (HCC):Baseline creatinine around 1.0.Her creatinine was 1.27 on admission, -Suspect cardiorenal component, improving with diuresis, down to 1.1 now 1.2 -this is stable  Hypothyroidism: -Synthroid  Iron deficiency anemia and chronic disease -Given IV iron yesterday, add oral iron  at FU  Morbid obesity, BMI is 39 -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   Discharge  Exam: Vitals:   12/09/18 0455 12/09/18 0849  BP: 91/63 98/62  Pulse: 87 80  Resp: 18   Temp: 98 F (36.7 C)   SpO2: 92% 96%    General: AAOx3 Cardiovascular: S1S2/RRR Respiratory: CTAB  Discharge Instructions   Discharge Instructions    Diet - low sodium heart healthy   Complete by: As directed    Increase activity slowly   Complete by: As directed      Allergies as of 12/09/2018      Reactions   Hydrocodone Itching   Can tolerate with benadryl       Medication List    STOP taking these medications   docusate sodium 100 MG capsule Commonly known as: Colace   HYDROcodone-acetaminophen 7.5-325 MG tablet Commonly known as: Norco   oxyCODONE 5 MG immediate release tablet Commonly known as: Oxy IR/ROXICODONE     TAKE these medications   acetaminophen 650 MG CR tablet Commonly known as: TYLENOL Take 650 mg by mouth every 8 (eight) hours as needed for pain.   apixaban 5 MG Tabs tablet Commonly known as: ELIQUIS Take 1 tablet (5 mg total) by mouth 2 (two) times daily.   calcitRIOL 0.5 MCG capsule Commonly known as: ROCALTROL TAKE 1 CAPSULE BY MOUTH TWICE A DAY   calcium carbonate 1250 (500 Ca) MG tablet Commonly known as: OS-CAL - dosed in mg of elemental calcium Take 2 tablets (1,000 mg of elemental calcium total) by mouth 2 (two) times daily. What changed: when to take this   clotrimazole 1 % cream Commonly known as: [place] TWICE A DAY What changed: See the new instructions.   cyanocobalamin 1000 MCG/ML injection Commonly known as: (VITAMIN B-12) Inject 1 mL (1,000 mcg total) into the muscle every 30 (thirty) days.   cyclobenzaprine 10 MG tablet Commonly known as: FLEXERIL Take 2 tablets (20 mg total) by mouth 2 (two) times daily.   DULoxetine 60 MG capsule Commonly known as: CYMBALTA Take 1 capsule (60 mg  total) by mouth daily.   eszopiclone 2 MG Tabs tablet Commonly known as: LUNESTA Take 1 tablet (2 mg total) by mouth at bedtime.   fluorometholone 0.1 % ophthalmic suspension Commonly known as: FML Place 1 drop into both eyes 4 (four) times daily.   furosemide 20 MG tablet Commonly known as: LASIX Take 1 tablet (20 mg total) by mouth daily as needed (swelling). What changed: how much to take   levETIRAcetam 500 MG tablet Commonly known as: Keppra Take 1 tablet (500 mg total) by mouth 2 (two) times daily.   levothyroxine 200 MCG tablet Commonly known as: SYNTHROID TAKE 1 TABLET BY MOUTH EVERY DAY BEFORE BREAKFAST What changed: See the new instructions.   magnesium oxide 400 MG tablet Commonly known as: MAG-OX Take 1 tablet (400 mg total) by mouth daily.   metoprolol succinate 25 MG 24 hr tablet Commonly known as: TOPROL-XL TAKE 1 TABLET BY MOUTH IN AM AND TAKE YOUR 50 MG TABLETS IN THE EVENING TAKE WITH FOOD What changed:   how much to take  how to take this  when to take this  additional instructions   mirabegron ER 50 MG Tb24 tablet Commonly known as: Myrbetriq Take 1 tablet (50 mg total) by mouth daily.   NONFORMULARY OR COMPOUNDED ITEM [facility]:  Antifungal Cream - Terbinafine 3%, Fluconazole 2%, Tea Tree Oil 5%, Urea 10%, Ibuprofen 2% in DMSO Suspension #67ml. Apply to affected toenail(s) once daily (at bedtime) or twice daily.  polyethylene glycol 17 g packet Commonly known as: MIRALAX / GLYCOLAX Take 17 g by mouth 2 (two) times daily.   potassium chloride SA 20 MEQ tablet Commonly known as: KLOR-CON Take 2 tablets (40 mEq total) by mouth daily as needed (on days you take lasix).   senna 8.6 MG tablet Commonly known as: SENOKOT Take 2 tablets by mouth daily.   traZODone 50 MG tablet Commonly known as: DESYREL Take 1 tablet (50 mg total) by mouth at bedtime.   Vitamin D (Ergocalciferol) 1.25 MG (50000 UT) Caps capsule Commonly known as:  DRISDOL Take 1 capsule (50,000 Units total) by mouth every Wednesday.      Allergies  Allergen Reactions  . Hydrocodone Itching    Can tolerate with benadryl     Follow-up Information    Bakare, Mobolaji B, MD. Schedule an appointment as soon as possible for a visit in 3 days.   Specialty: Internal Medicine Why: For recheck Contact information: [place] [address] [phone]        Pixie Casino, MD. Schedule an appointment as soon as possible for a visit in 2 week(s).   Specialty: Cardiology Contact information: [place] [address] [phone]            The results of significant diagnostics from this hospitalization (including imaging, microbiology, ancillary and laboratory) are listed below for reference.    Significant Diagnostic Studies: Dg Chest 2 View  Result Date: 12/06/2018 CLINICAL DATA:  Chest pain a few days ago.  Shortness of breath. EXAM: CHEST - 2 VIEW COMPARISON:  None. FINDINGS: Stable cardiomegaly. The hila and mediastinum are normal. No pneumothorax. No nodules or masses. No focal infiltrates or overt edema. IMPRESSION: No active cardiopulmonary disease. Electronically Signed   By: Dorise Bullion III M.D   On: 12/06/2018 15:53   Ct Angio Chest Pe W And/or Wo Contrast  Result Date: 12/06/2018 CLINICAL DATA:  PE suspected, low pretest probability EXAM: CT ANGIOGRAPHY CHEST WITH CONTRAST TECHNIQUE: Multidetector CT imaging of the chest was performed using the standard protocol during bolus administration of intravenous contrast. Multiplanar CT image reconstructions and MIPs were obtained to evaluate the vascular anatomy. CONTRAST:  138mL OMNIPAQUE IOHEXOL 350 MG/ML SOLN COMPARISON:  Radiograph 12/06/2018, CTA 09/21/2017 FINDINGS: Cardiovascular: Satisfactory opacification of the pulmonary arteries to the segmental level. No evidence of pulmonary embolism. Marked enlargement of the  central pulmonary arteries is similar to comparison study. No CT evidence of right heart strain. No elevation of the RV/LV ratio (0.65). Mild cardiomegaly with biatrial enlargement, right greater than left. There is reflux of contrast into the hepatic veins. Small amount of contrast material within the right atrium likely related to intravenous access. Aorta is normal caliber. Shared origin of the brachiocephalic and left common carotid artery. Proximal great vessels are unremarkable. There is distention of the azygos and hemi azygous veins. Mediastinum/Nodes: No enlarged mediastinal or axillary lymph nodes. Thyroid gland, trachea, and esophagus demonstrate no significant findings. Lungs/Pleura: Areas of mosaic attenuation within the lungs may reflect changes seen with imaging during exhalation. Ground-glass opacity seen posteriorly in the right upper lobe could reflect additional volume loss or a developing airspace process. More bandlike opacities in the right lung base may reflect scarring given presence comparison study from 2019. No pneumothorax. No effusion. No suspicious pulmonary nodules or masses. Upper Abdomen: Extensive postsurgical changes at the diaphragmatic hiatus compatible with history of gastroplasty and gastric bypass. Patient is post cholecystectomy.  There is notable fluid distention of the duodenal sweep, nonspecific given the postsurgical changes. Nonobstructing calcifications are present in the interpolar and lower pole right kidney. Musculoskeletal: Multilevel degenerative changes are present in the imaged portions of the spine. Chest wall deformity is similar to comparison. Superior endplate changes of [id number] and inferior endplate changes of T5 are unchanged from comparison study.No acute osseous abnormality or suspicious osseous lesion. No suspicious chest wall lesions. Postsurgical changes from right shoulder reverse arthroplasty. Intra-articular body noted in the posterior recess of the  left glenohumeral joint. Review of the MIP images confirms the above findings. IMPRESSION: 1. No evidence of pulmonary embolism. 2. Chronic central pulmonary arterial enlargement compatible with pulmonary artery hypertension. 3. Mild cardiomegaly with biatrial enlargement, right greater than left. 4. Reflux of contrast into the hepatic veins, suggestive of elevated right heart pressures. Distention of the azygos and hemi azygous veins suggesting suggest increased preload. 5. Ground-glass opacity posteriorly in the right upper lobe could reflect volume loss or a developing airspace process. Difficult to discern given imaging during exhalation. 6. Extensive postsurgical changes at the diaphragmatic hiatus compatible with history of gastroplasty and gastric bypass. There is notable fluid distention of the duodenal sweep, nonspecific given the postsurgical changes. 7. Nonobstructing right nephrolithiasis. 8. Right shoulder hemiarthroplasty.  No acute complication. 9. Intra-articular body of the left shoulder recess. Stable from prior. Electronically Signed   By: Lovena Le M.D.   On: 12/06/2018 23:18   Dg Chest Portable 1 View  Result Date: 11/27/2018 CLINICAL DATA:  Dyspnea EXAM: PORTABLE CHEST 1 VIEW COMPARISON:  Portable exam 1159 hours compared to 08/03/2018 FINDINGS: Enlarged cardiac silhouette, cannot exclude pericardial effusion with this configuration. Mediastinal contours and pulmonary vascularity normal. Lungs clear. No acute infiltrate, pleural effusion or pneumothorax. Bones demineralized with note of a RIGHT shoulder prosthesis. IMPRESSION: Enlargement of cardiac silhouette, cannot exclude pericardial effusion with this appearance. No acute abnormalities. Electronically Signed   By: Lavonia Dana M.D.   On: 11/27/2018 12:56   Vas [place] Lower Extremity Venous (dvt)  Result Date: 11/25/2018  Lower Venous Study Other Indications: Patient c/o swelling and pain in the left posterior mid to                     distal thigh and medial calf. Risk Factors: Surgery, total knee replacement one week ago. Comparison Study: NA Performing Technologist: Sharlett Iles RVT  Examination Guidelines: A complete evaluation includes B-mode imaging, spectral Doppler, color Doppler, and power Doppler as needed of all accessible portions of each vessel. Bilateral testing is considered an integral part of a complete examination. Limited examinations for reoccurring indications may be performed as noted.  +-----+---------------+---------+-----------+----------+--------------+ RIGHTCompressibilityPhasicitySpontaneityPropertiesThrombus Aging +-----+---------------+---------+-----------+----------+--------------+ CFV  Full           Yes      Yes                                 +-----+---------------+---------+-----------+----------+--------------+  +---------+---------------+---------+-----------+----------+--------------+ LEFT     CompressibilityPhasicitySpontaneityPropertiesThrombus Aging +---------+---------------+---------+-----------+----------+--------------+ CFV      Full           Yes      Yes                                 +---------+---------------+---------+-----------+----------+--------------+ SFJ      Full  Yes      Yes                                 +---------+---------------+---------+-----------+----------+--------------+ FV Prox  Full           Yes      Yes                                 +---------+---------------+---------+-----------+----------+--------------+ FV Mid   Full                                                        +---------+---------------+---------+-----------+----------+--------------+ FV DistalFull           Yes      Yes                                 +---------+---------------+---------+-----------+----------+--------------+ PFV      Full           Yes      Yes                                  +---------+---------------+---------+-----------+----------+--------------+ POP      Full           Yes      Yes                                 +---------+---------------+---------+-----------+----------+--------------+ PTV      Full                    No                                  +---------+---------------+---------+-----------+----------+--------------+ PERO     Full                    No                                  +---------+---------------+---------+-----------+----------+--------------+ Gastroc  Full                                                        +---------+---------------+---------+-----------+----------+--------------+ GSV      Full           Yes      Yes                                 +---------+---------------+---------+-----------+----------+--------------+  Findings reported to Dr Alvan Dame via fax at 4:05 pm.  Summary: Right: No evidence of common femoral vein obstruction.  Left: No evidence of deep vein thrombosis in the lower extremity. No indirect evidence of obstruction proximal to the  inguinal ligament. No cystic structure found in the popliteal fossa. Unable to visualize the gastrocnemius veins and proximal PTVs and peroneal veins. Limited visualization of the mid PTVs and peroneal veins; however, flow is evident upon distal augmentation. Specifically, the popliteal vein and tibioperoneal confluence are without thrombus. There appears to be interstitial fluid in the  medial calf.  *See table(s) above for measurements and observations. CC: Dr. Leretha Pol Electronically signed by Kathlyn Sacramento MD on 11/25/2018 at 9:04:16 AM.    Final     Microbiology: Recent Results (from the past 240 hour(s))  SARS CORONAVIRUS 2 (TAT 6-24 HRS) Nasopharyngeal Nasopharyngeal Swab     Status: None   Collection Time: 12/07/18  1:39 AM   Specimen: Nasopharyngeal Swab  Result Value Ref Range Status   SARS Coronavirus 2 NEGATIVE NEGATIVE Final    Comment:  (NOTE) SARS-CoV-2 target nucleic acids are NOT DETECTED. The SARS-CoV-2 RNA is generally detectable in upper and lower respiratory specimens during the acute phase of infection. Negative results do not preclude SARS-CoV-2 infection, do not rule out co-infections with other pathogens, and should not be used as the sole basis for treatment or other patient management decisions. Negative results must be combined with clinical observations, patient history, and epidemiological information. The expected result is Negative. Fact Sheet for Patients: SugarRoll.be Fact Sheet for Healthcare Providers: https://www.woods-mathews.com/ This test is not yet approved or cleared by the [place] FDA and  has been authorized for detection and/or diagnosis of SARS-CoV-2 by FDA under an Emergency Use Authorization (EUA). This EUA will remain  in effect (meaning this test can be used) for the duration of the COVID-19 declaration under Section 56 4(b)(1) of the Act, 21 U.S.C. section 360bbb-3(b)(1), unless the authorization is terminated or revoked sooner. Performed at [facility], [place] [address] [place], [place] [address]      Labs: Basic Metabolic Panel: Recent Labs  Lab 12/06/18 1518 12/07/18 0517 12/08/18 0307 12/09/18 0315  NA 140 141 141 142  K 4.8 3.7 3.3* 3.7  CL 101 102 98 98  CO2 27 31 30  32  GLUCOSE 79 87 86 94  BUN 25* 22 21 23   CREATININE 1.27* 1.24* 1.18* 1.26*  CALCIUM 8.8* 8.5* 8.3* 8.4*  MG  --  1.4*  --   --    Liver Function Tests: No results for input(s): AST, ALT, ALKPHOS, BILITOT, PROT, ALBUMIN in the last 168 hours. No results for input(s): LIPASE, AMYLASE in the last 168 hours. No results for input(s): AMMONIA in the last 168 hours. CBC: Recent Labs  Lab 12/06/18 1518 12/07/18 0517 12/08/18 0307 12/09/18 0315  WBC 4.8 5.2 4.8 5.4  HGB 9.7* 8.6* 8.8* 9.1*  HCT 31.5* 28.1* 28.3* 29.0*  MCV 97.2 99.6 98.3  96.7  PLT PLATELET CLUMPS NOTED ON SMEAR, COUNT APPEARS DECREASED 316 317 303   Cardiac Enzymes: No results for input(s): CKTOTAL, CKMB, CKMBINDEX, TROPONINI in the last 168 hours. BNP: BNP (last 3 results) Recent Labs    08/03/18 0409 11/27/18 1235 12/06/18 1934  BNP 321.9* 239.3* 182.3*    ProBNP (last 3 results) No results for input(s): PROBNP in the last 8760 hours.  CBG: No results for input(s): GLUCAP in the last 168 hours.     Signed:  Domenic Polite MD.  [facility] 12/09/2018, 1:40 PM

## 2018-12-09 NOTE — Discharge Planning (Signed)
Nsg Discharge Note  Admit Date:  12/06/2018 Discharge date: 12/09/2018   Shaquayla Gaynelle Pranger to be D/C'd Home per MD order.  AVS completed.     Discharge Medication: Allergies as of 12/09/2018      Reactions   Hydrocodone Itching   Can tolerate with benadryl       Medication List    STOP taking these medications   docusate sodium 100 MG capsule Commonly known as: Colace   HYDROcodone-acetaminophen 7.5-325 MG tablet Commonly known as: Norco   oxyCODONE 5 MG immediate release tablet Commonly known as: Oxy IR/ROXICODONE     TAKE these medications   acetaminophen 650 MG CR tablet Commonly known as: TYLENOL Take 650 mg by mouth every 8 (eight) hours as needed for pain.   apixaban 5 MG Tabs tablet Commonly known as: ELIQUIS Take 1 tablet (5 mg total) by mouth 2 (two) times daily.   calcitRIOL 0.5 MCG capsule Commonly known as: ROCALTROL TAKE 1 CAPSULE BY MOUTH TWICE A DAY   calcium carbonate 1250 (500 Ca) MG tablet Commonly known as: OS-CAL - dosed in mg of elemental calcium Take 2 tablets (1,000 mg of elemental calcium total) by mouth 2 (two) times daily. What changed: when to take this   clotrimazole 1 % cream Commonly known as: [place] TWICE A DAY What changed: See the new instructions.   cyanocobalamin 1000 MCG/ML injection Commonly known as: (VITAMIN B-12) Inject 1 mL (1,000 mcg total) into the muscle every 30 (thirty) days.   cyclobenzaprine 10 MG tablet Commonly known as: FLEXERIL Take 2 tablets (20 mg total) by mouth 2 (two) times daily.   DULoxetine 60 MG capsule Commonly known as: CYMBALTA Take 1 capsule (60 mg total) by mouth daily.   eszopiclone 2 MG Tabs tablet Commonly known as: LUNESTA Take 1 tablet (2 mg total) by mouth at bedtime.   fluorometholone 0.1 % ophthalmic suspension Commonly known as: FML Place 1 drop into both eyes 4 (four) times daily.   furosemide 20 MG tablet Commonly known as: LASIX Take 1  tablet (20 mg total) by mouth daily as needed (swelling). What changed: how much to take   levETIRAcetam 500 MG tablet Commonly known as: Keppra Take 1 tablet (500 mg total) by mouth 2 (two) times daily.   levothyroxine 200 MCG tablet Commonly known as: SYNTHROID TAKE 1 TABLET BY MOUTH EVERY DAY BEFORE BREAKFAST What changed: See the new instructions.   magnesium oxide 400 MG tablet Commonly known as: MAG-OX Take 1 tablet (400 mg total) by mouth daily.   metoprolol succinate 25 MG 24 hr tablet Commonly known as: TOPROL-XL TAKE 1 TABLET BY MOUTH IN AM AND TAKE YOUR 50 MG TABLETS IN THE EVENING TAKE WITH FOOD What changed:   how much to take  how to take this  when to take this  additional instructions   mirabegron ER 50 MG Tb24 tablet Commonly known as: Myrbetriq Take 1 tablet (50 mg total) by mouth daily.   NONFORMULARY OR COMPOUNDED ITEM [facility]:  Antifungal Cream - Terbinafine 3%, Fluconazole 2%, Tea Tree Oil 5%, Urea 10%, Ibuprofen 2% in DMSO Suspension #75ml. Apply to affected toenail(s) once daily (at bedtime) or twice daily.   polyethylene glycol 17 g packet Commonly known as: MIRALAX / GLYCOLAX Take 17 g by mouth 2 (two) times daily.   potassium chloride SA 20 MEQ tablet Commonly known as: KLOR-CON Take 2 tablets (40 mEq total) by mouth daily as needed (on days  you take lasix).   senna 8.6 MG tablet Commonly known as: SENOKOT Take 2 tablets by mouth daily.   traZODone 50 MG tablet Commonly known as: DESYREL Take 1 tablet (50 mg total) by mouth at bedtime.   Vitamin D (Ergocalciferol) 1.25 MG (50000 UT) Caps capsule Commonly known as: DRISDOL Take 1 capsule (50,000 Units total) by mouth every Wednesday.       Discharge Assessment: Vitals:   12/09/18 0455 12/09/18 0849  BP: 91/63 98/62  Pulse: 87 80  Resp: 18   Temp: 98 F (36.7 C)   SpO2: 92% 96%   Skin clean, dry and intact without evidence of skin break down, no evidence of skin  tears noted. IV catheter discontinued intact. Site without signs and symptoms of complications - no redness or edema noted at insertion site, patient denies c/o pain - only slight tenderness at site.  Dressing with slight pressure applied.  D/c Instructions-Education: Discharge instructions given to patient/family with verbalized understanding. D/c education completed with patient/family including follow up instructions, medication list, d/c activities limitations if indicated, with other d/c instructions as indicated by MD - patient able to verbalize understanding, all questions fully answered. Patient instructed to return to ED, call 911, or call MD for any changes in condition.  Patient escorted via [place], and D/C home via private auto.  Hiram Comber, RN 12/09/2018 10:27 AM

## 2018-12-09 NOTE — TOC Transition Note (Signed)
Transition of Care [facility]) - CM/SW Discharge Note   Patient Details  Name: Jillian Eaton MRN: [id number] Date of Birth: [birth year]  [facility]) CM/SW Contact:  Benard Halsted, LCSW Phone Number: 12/09/2018, 10:14 AM   Clinical Narrative:    Patient reported that she will be continuing with the outpatient rehab she has been going to. CSW confirmed PCP. Patient declines other needs at this time and states she has everything she needs at home. Her spouse is picking her up at discharge.    Final next level of care: OP Rehab Barriers to Discharge: No Barriers Identified   Patient Goals and CMS Choice        Discharge Placement                    Patient and family notified of of transfer: 12/09/18  Discharge Plan and Services                                     Social Determinants of Health (SDOH) Interventions     Readmission Risk Interventions Readmission Risk Prevention Plan 12/09/2018 05/30/2018 05/28/2018  Transportation Screening Complete - Complete  PCP or Specialist Appt within 3-5 Days Complete Complete -  HRI or Home Care Consult Complete - Complete  Social Work Consult for [place] Planning/Counseling Complete - Not Complete  SW consult not completed comments - - no need  Palliative Care Screening Not Applicable - Not Applicable  Medication Review (RN Care Manager) Referral to Pharmacy Referral to Pharmacy -  Some recent data might be hidden

## 2018-12-11 DIAGNOSIS — J9621 Acute and chronic respiratory failure with hypoxia: Secondary | ICD-10-CM | POA: Diagnosis not present

## 2018-12-11 DIAGNOSIS — N1832 Chronic kidney disease, stage 3b: Secondary | ICD-10-CM | POA: Diagnosis not present

## 2018-12-11 DIAGNOSIS — I5189 Other ill-defined heart diseases: Secondary | ICD-10-CM | POA: Diagnosis not present

## 2018-12-11 DIAGNOSIS — D509 Iron deficiency anemia, unspecified: Secondary | ICD-10-CM | POA: Diagnosis not present

## 2018-12-12 DIAGNOSIS — M25662 Stiffness of left knee, not elsewhere classified: Secondary | ICD-10-CM | POA: Insufficient documentation

## 2018-12-13 ENCOUNTER — Other Ambulatory Visit: Payer: Self-pay

## 2018-12-13 ENCOUNTER — Ambulatory Visit
Admission: RE | Admit: 2018-12-13 | Discharge: 2018-12-13 | Disposition: A | Discharge status: Home / Self Care | Payer: Medicare Other | Source: Ambulatory Visit | Attending: Internal Medicine | Admitting: Internal Medicine

## 2018-12-13 DIAGNOSIS — Z1231 Encounter for screening mammogram for malignant neoplasm of breast: Secondary | ICD-10-CM

## 2018-12-13 DIAGNOSIS — M25662 Stiffness of left knee, not elsewhere classified: Secondary | ICD-10-CM | POA: Diagnosis not present

## 2018-12-13 DIAGNOSIS — M25562 Pain in left knee: Secondary | ICD-10-CM | POA: Diagnosis not present

## 2018-12-17 ENCOUNTER — Other Ambulatory Visit (HOSPITAL_COMMUNITY): Payer: Medicare Other

## 2018-12-17 DIAGNOSIS — D509 Iron deficiency anemia, unspecified: Secondary | ICD-10-CM | POA: Diagnosis not present

## 2018-12-17 DIAGNOSIS — N1832 Chronic kidney disease, stage 3b: Secondary | ICD-10-CM | POA: Diagnosis not present

## 2018-12-18 DIAGNOSIS — M25662 Stiffness of left knee, not elsewhere classified: Secondary | ICD-10-CM | POA: Diagnosis not present

## 2018-12-18 DIAGNOSIS — M25562 Pain in left knee: Secondary | ICD-10-CM | POA: Diagnosis not present

## 2018-12-20 DIAGNOSIS — M25662 Stiffness of left knee, not elsewhere classified: Secondary | ICD-10-CM | POA: Diagnosis not present

## 2018-12-20 DIAGNOSIS — M25562 Pain in left knee: Secondary | ICD-10-CM | POA: Diagnosis not present

## 2018-12-21 NOTE — Progress Notes (Signed)
Cardiology Office Note   Date:  12/23/2018   ID:  Jillian Eaton, DOB [birth year], MRN [id number]  PCP:  [name], MD  Cardiologist: Dr. [facility] Follow Up    History of Present Illness: Jillian Eaton is a 68 y.o. female who presents for ongoing assessment and management of chronic diastolic heart failure, atrial fibrillation CHADS VAS Score of 4 on Eliquis and metoprolol, pulmonary hypertension most recent PA pressure 64 mmHg with normal LV systolic function, progressive and chronic dyspnea on exertion, hypertension, with other history to include seizures, super morbid obesity, chronic asthmatic bronchitis, chronic kidney disease stage III (baseline creatinine 1/2), and thyroid goiter.  She was last seen in the office by Dr. [name] on 12/05/2018 at which time a repeat echocardiogram was ordered to reassess for development of any new cardiomyopathy or worsening pericardial effusion.  She was to continue on current dose of Lasix.  She underwent left TKA on 11/15/2018 by Dr. Alvan Dame, and proceeded with PT. She was seen on follow up by [name], NP on 11/20/2018 and had some complaints of chest discomfort. This was felt to be musculoskeletal with no planned ischemic testing.   Unfortunately, the patient was admitted to the hospital on 12/06/2018 in the setting of acute respiratory failure with hypoxia, which occurred suddenly.  CT angiogram revealed negative for PE but did show some groundglass opacity in the right upper lobe, pulmonary arterial hypertension, and reflux of contrast in the hepatic veins suggestive of elevated right heart pressures..  The patient was diuresed with IV Lasix and had eliminated 4 L of fluid at the time of her discharge.  She was not sent home on oxygen.  Of note she was COVID-19 negative.  She was discharged on Lasix and potassium.  Repeat echo during admission revealed preserved LV function with normal wall motion and diastolic  dysfunction with improved RV pressures.   She comes today with ongoing complaints of DOE. She states that she did not take her lasix for two days and she was doing better, but now breathing is worse.  She has gained 8 lbs. She states that she is not followed by a pulmonologist, but has been seen in the past by [facility] pulmonologist. She has been tested for OSA and was found to be negative.  Past Medical History:  Diagnosis Date  . Anemia   . Asthma    flare up last yr  . CHF (congestive heart failure) ([place])   . Chronic headache    " better now "   . DOE (dyspnea on exertion)    2D ECHO, 02/12/2012 - EF 60-65%, moderate concentric hypertrophy  . Fibromyalgia    nerve pain"left side at waist level" "can't lay on that side without pain" , "HOB elevation helps"; 9-17- since i lost the wieght , i dont  think i have this anymore   . Heart murmur   . Hematuria - cause not known    resolved   . History of kidney stones    x 2 '13, '14 surgery to remove  . Hypertension   . SBO (small bowel obstruction) ([place]) 06/07/2013  . SBO (small bowel obstruction) ([place])    rqueired admission 2020  . Sleep apnea    tested several times and came up negative  . Thyroid disease    "goiter"  . Transfusion history    10 yrs+    Past Surgical History:  Procedure Laterality Date  . ABDOMINAL ADHESION SURGERY  2004   open LOA - in [place]  . CARDIAC CATHETERIZATION  04/04/2010   No significant obstructive coronary artery disease  . CARDIOVERSION N/A 08/08/2017   Procedure: CARDIOVERSION;  Surgeon: Pixie Casino, MD;  Location: [place];  Service: Cardiovascular;  Laterality: N/A;  . CHOLECYSTECTOMY  1990  . COLONOSCOPY W/ POLYPECTOMY    . COLONOSCOPY WITH PROPOFOL N/A 04/10/2014   Procedure: COLONOSCOPY WITH PROPOFOL;  Surgeon: Beryle Beams, MD;  Location: [facility];  Service: Endoscopy;  Laterality: N/A;  . EYE SURGERY     lasik 20-25 yrs ago  . GASTROPLASTY VERTICAL BANDED  1985  .  [facility]   In [place] for SBO  . REVERSE SHOULDER ARTHROPLASTY Right 03/29/2018   Procedure: REVERSE SHOULDER ARTHROPLASTY;  Surgeon: Netta Cedars, MD;  Location: [place];  Service: Orthopedics;  Laterality: Right;  . ROUX-EN-Y GASTRIC BYPASS  1992   Conversion VBG to RnYGB in [place], [place]  . SHOULDER ARTHROSCOPY WITH SUBACROMIAL DECOMPRESSION  2016   Dr Berenice Primas, [place], [place]  . SMALL INTESTINE SURGERY  2000   SBO - LOA w SB resection  . TOTAL KNEE ARTHROPLASTY Left 11/14/2018   Procedure: TOTAL KNEE ARTHROPLASTY;  Surgeon: Paralee Cancel, MD;  Location: [facility];  Service: Orthopedics;  Laterality: Left;  70 mins  . TOTAL THYROIDECTOMY  06/26/2012   Dr Celine Ahr, [facility]  . TUBAL LIGATION  1986  . URETHRAL DILATION  2012   w cystoscopy.  Dr Gaynelle Arabian     Current Outpatient Medications  Medication Sig Dispense Refill  . acetaminophen (TYLENOL) 650 MG CR tablet Take 650 mg by mouth every 8 (eight) hours as needed for pain.    Mar[place] Kitchen apixaban (ELIQUIS) 5 MG TABS tablet Take 1 tablet (5 mg total) by mouth 2 (two) times daily. 60 tablet 0  . calcitRIOL (ROCALTROL) 0.5 MCG capsule TAKE 1 CAPSULE BY MOUTH TWICE A DAY (Patient taking differently: Take 0.5 mcg by mouth 2 (two) times daily. ) 180 capsule 0  . calcium carbonate (OS-CAL - DOSED IN MG OF ELEMENTAL CALCIUM) 1250 (500 Ca) MG tablet Take 2 tablets (1,000 mg of elemental calcium total) by mouth 2 (two) times daily. (Patient taking differently: Take 2 tablets by mouth 2 (two) times daily with a meal. ) 120 tablet 0  . clotrimazole (LOTRIMIN) 1 % cream APPLY TO AFFECTED AREA TWICE A DAY (Patient taking differently: Apply 1 application topically 2 (two) times daily. ) 30 g 0  . cyanocobalamin (,VITAMIN B-12,) 1000 MCG/ML injection Inject 1 mL (1,000 mcg total) into the muscle every 30 (thirty) days. 1 mL 0  . cyclobenzaprine (FLEXERIL) 10 MG tablet Take 2 tablets (20 mg total) by mouth 2 (two) times  daily. 80 tablet 0  . DULoxetine (CYMBALTA) 60 MG capsule Take 1 capsule (60 mg total) by mouth daily. 30 capsule 0  . eszopiclone (LUNESTA) 2 MG TABS tablet Take 1 tablet (2 mg total) by mouth at bedtime. 20 tablet 0  . fluorometholone (FML) 0.1 % ophthalmic suspension Place 1 drop into both eyes 4 (four) times daily.    . furosemide (LASIX) 20 MG tablet Take 1 tablet (20 mg total) by mouth daily as needed (swelling). (Patient taking differently: Take 40 mg by mouth daily as needed (swelling). ) 30 tablet 11  . levETIRAcetam (KEPPRA) 500 MG tablet Take 1 tablet (500 mg total) by mouth 2 (two) times daily. 180 tablet 1  . levothyroxine (SYNTHROID) 200 MCG tablet TAKE  1 TABLET BY MOUTH EVERY DAY BEFORE BREAKFAST (Patient taking differently: Take 200 mcg by mouth daily before breakfast. ) 90 tablet 3  . magnesium oxide (MAG-OX) 400 MG tablet Take 1 tablet (400 mg total) by mouth daily. 30 tablet 0  . metoprolol succinate (TOPROL-XL) 25 MG 24 hr tablet TAKE 1 TABLET BY MOUTH IN AM AND TAKE YOUR 50 MG TABLETS IN THE EVENING TAKE WITH FOOD (Patient taking differently: Take 25 mg by mouth 2 (two) times daily. ) 90 tablet 3  . mirabegron ER (MYRBETRIQ) 50 MG TB24 tablet Take 1 tablet (50 mg total) by mouth daily. 30 tablet 0  . NONFORMULARY OR COMPOUNDED ITEM [facility]:  Antifungal Cream - Terbinafine 3%, Fluconazole 2%, Tea Tree Oil 5%, Urea 10%, Ibuprofen 2% in DMSO Suspension #75ml. Apply to affected toenail(s) once daily (at bedtime) or twice daily. 30 each 11  . senna (SENOKOT) 8.6 MG tablet Take 2 tablets by mouth daily.    . traZODone (DESYREL) 50 MG tablet Take 1 tablet (50 mg total) by mouth at bedtime. 30 tablet 0  . Vitamin D, Ergocalciferol, (DRISDOL) 1.25 MG (50000 UT) CAPS capsule Take 1 capsule (50,000 Units total) by mouth every Wednesday. 4 capsule 0   No current facility-administered medications for this visit.     Allergies:   Hydrocodone    Social History:  The patient   reports that she has never smoked. She has never used smokeless tobacco. She reports that she does not drink alcohol or use drugs.   Family History:  The patient's family history includes Asthma in her father; Breast cancer in her maternal grandmother, mother, and paternal grandmother; Cancer in her maternal grandmother, mother, and paternal grandmother; Diabetes in her father and mother; Epilepsy in her mother and sister; Heart disease in her father; Hypertension in her father and mother; Kidney disease in her father; Seizures in her mother.    ROS: All other systems are reviewed and negative. Unless otherwise mentioned in H&P    PHYSICAL EXAM: VS:  BP (!) 86/58   Pulse 75   Ht 5\' 6"  (1.676 m)   Wt 228 lb (103.4 kg)   BMI 36.80 kg/m  , BMI Body mass index is 36.8 kg/m. GEN: Well nourished, well developed, in no acute distress.Obese HEENT: normal Neck: no JVD, carotid bruits, or masses Cardiac: IRRR; no murmurs, rubs, or gallops,no edema  Respiratory:  Clear to auscultation bilaterally, normal work of breathing GI: soft, nontender, nondistended, + BS MS: no deformity or atrophy Skin: warm and dry, no rash Neuro:  Strength and sensation are intact Psych: euthymic mood, full affect   EKG: Atrial fibrillation, with PVC's. Anterior non-specific abnormalities.   Recent Labs: 08/01/2018: ALT 16 12/06/2018: B Natriuretic Peptide 182.3 12/07/2018: Magnesium 1.4 12/09/2018: BUN 23; Creatinine, Ser 1.26; Hemoglobin 9.1; Platelets 303; Potassium 3.7; Sodium 142    Lipid Panel    Component Value Date/Time   CHOL 150 05/27/2018 0543   TRIG 50 05/27/2018 0543   HDL 62 05/27/2018 0543   CHOLHDL 2.4 05/27/2018 0543   VLDL 10 05/27/2018 0543   LDLCALC 78 05/27/2018 0543      Wt Readings from Last 3 Encounters:  12/23/18 228 lb (103.4 kg)  12/09/18 219 lb 5.7 oz (99.5 kg)  12/05/18 249 lb 12.8 oz (113.3 kg)      Other studies Reviewed: Echocardiogram 12-18-18 1. Left  ventricular ejection fraction, by visual estimation, is 55 to 60%. The left ventricle has normal function. Normal left  ventricular size. There is no left ventricular hypertrophy.  2. Left ventricular diastolic function could not be evaluated pattern of LV diastolic filling.  3. Global right ventricle has normal systolic function.The right ventricular size is mildly enlarged. No increase in right ventricular wall thickness.  4. Left atrial size was severely dilated.  5. Right atrial size was severely dilated.  6. The mitral valve is normal in structure. Trace mitral valve regurgitation.  7. The tricuspid valve is normal in structure. Tricuspid valve regurgitation mild-moderate.  8. The aortic valve is normal in structure. Aortic valve regurgitation is trivial by color flow Doppler.  9. The pulmonic valve was grossly normal. Pulmonic valve regurgitation is not visualized by color flow Doppler. 10. Normal pulmonary artery systolic pressure. 11. The inferior vena cava is normal in size with greater than 50% respiratory variability, suggesting right atrial pressure of 3 mmHg.  ASSESSMENT AND PLAN:  1. Chronic dyspnea; Multifactorial in the setting of atrial fib, anemia and chronic asthmatic bronchitis, with questionable pulmonary hypertension. She is due to see her PCP today as they have recently drawn labs last week. I am referring her to pulmonology for their recommendations, as she does not want to go back to [facility] pulmonology.   2. Atrial fib: She cannot tell that she is in atrial fib by symptoms. She can feel that her heart is irregular, but she denies chest pain or worsening breathing as a result. She will remain on apixaban, metoprolol for rate control. No wheezes are noted. Can consider changing to bisoprolol if her breathing does not improve.  3. Anemia: Has a history of iron deficiency anemia. Has had IV infusions. She is to see PCP today for lab review and recommendations for  further treatment.   4. Chronic Diastolic CHF: She has not taken the lasix for 2 days as she felt she was feeling better. She has gained 8 lbs. I have asked her to take two days of 60 mg a day and then go back to 40 mg daily thereafter.   5. Questionable Pulmonary Hypertension by history: Echo on 12/08/2018 did not reveal high pulmonary pressures or high RV pressures after she was diuresed.   6. Need or right knee replacement: Patient wants to wait to see Dr. [name] before undergoing surgery, planned Dec 29th. She has appt scheduled on January 24, 2019.   Current medicines are reviewed at length with the patient today.    Labs/ tests ordered today include: None   Phill Myron. West Pugh, ANP, AACC   12/23/2018 11:56 AM    [address] Office [phone] Fax [phone]  Notice: This dictation was prepared with Dragon dictation along with smaller phrase technology. Any transcriptional errors that result from this process are unintentional and may not be corrected upon review.

## 2018-12-23 ENCOUNTER — Ambulatory Visit (INDEPENDENT_AMBULATORY_CARE_PROVIDER_SITE_OTHER): Payer: Medicare Other | Admitting: Adult Health

## 2018-12-23 ENCOUNTER — Other Ambulatory Visit: Payer: Self-pay

## 2018-12-23 ENCOUNTER — Encounter: Payer: Self-pay | Admitting: Adult Health

## 2018-12-23 VITALS — BP 86/58 | HR 75 | Ht 66.0 in | Wt 228.0 lb

## 2018-12-23 DIAGNOSIS — R0609 Other forms of dyspnea: Secondary | ICD-10-CM | POA: Diagnosis not present

## 2018-12-23 DIAGNOSIS — E8770 Fluid overload, unspecified: Secondary | ICD-10-CM | POA: Diagnosis not present

## 2018-12-23 DIAGNOSIS — I639 Cerebral infarction, unspecified: Secondary | ICD-10-CM | POA: Diagnosis not present

## 2018-12-23 DIAGNOSIS — I272 Pulmonary hypertension, unspecified: Secondary | ICD-10-CM

## 2018-12-23 DIAGNOSIS — D509 Iron deficiency anemia, unspecified: Secondary | ICD-10-CM | POA: Diagnosis not present

## 2018-12-23 DIAGNOSIS — R0600 Dyspnea, unspecified: Secondary | ICD-10-CM

## 2018-12-23 DIAGNOSIS — I5032 Chronic diastolic (congestive) heart failure: Secondary | ICD-10-CM

## 2018-12-23 DIAGNOSIS — N1832 Chronic kidney disease, stage 3b: Secondary | ICD-10-CM | POA: Diagnosis not present

## 2018-12-23 DIAGNOSIS — I4811 Longstanding persistent atrial fibrillation: Secondary | ICD-10-CM

## 2018-12-23 NOTE — Patient Instructions (Signed)
Medication Instructions:  INCREASE- Furosemide(Lasix) 60 mg by mouth daily for 2 days then back to regular dose  *If you need a refill on your cardiac medications before your next appointment, please call your pharmacy*  Lab Work: None Ordered  Testing/Procedures: None Ordered  Follow-Up: At [facility], you and your health needs are our priority.  As part of our continuing mission to provide you with exceptional heart care, we have created designated Provider Care Teams.  These Care Teams include your primary Cardiologist (physician) and Advanced Practice Providers (APPs -  Physician Assistants and Nurse Practitioners) who all work together to provide you with the care you need, when you need it.  Your next appointment:   1 Month   The format for your next appointment:   In Person  Provider:   K. Mali Hilty, MD  Other Instructions You have been referred to Pulmonologist

## 2018-12-25 DIAGNOSIS — M25662 Stiffness of left knee, not elsewhere classified: Secondary | ICD-10-CM | POA: Diagnosis not present

## 2018-12-25 DIAGNOSIS — Z471 Aftercare following joint replacement surgery: Secondary | ICD-10-CM | POA: Diagnosis not present

## 2018-12-25 DIAGNOSIS — Z96652 Presence of left artificial knee joint: Secondary | ICD-10-CM | POA: Diagnosis not present

## 2018-12-25 DIAGNOSIS — M25562 Pain in left knee: Secondary | ICD-10-CM | POA: Diagnosis not present

## 2018-12-31 ENCOUNTER — Ambulatory Visit: Payer: Medicare Other | Admitting: Podiatry

## 2018-12-31 DIAGNOSIS — M25662 Stiffness of left knee, not elsewhere classified: Secondary | ICD-10-CM | POA: Diagnosis not present

## 2018-12-31 DIAGNOSIS — M25562 Pain in left knee: Secondary | ICD-10-CM | POA: Diagnosis not present

## 2019-01-02 ENCOUNTER — Other Ambulatory Visit: Payer: Self-pay

## 2019-01-03 ENCOUNTER — Other Ambulatory Visit: Payer: Self-pay

## 2019-01-03 ENCOUNTER — Encounter (HOSPITAL_COMMUNITY): Payer: Self-pay | Admitting: Emergency Medicine

## 2019-01-03 ENCOUNTER — Emergency Department (HOSPITAL_COMMUNITY)
Admission: EM | Admit: 2019-01-03 | Discharge: 2019-01-04 | Disposition: A | Discharge status: Home / Self Care | Payer: Medicare Other | Attending: Emergency Medicine | Admitting: Emergency Medicine

## 2019-01-03 ENCOUNTER — Emergency Department (HOSPITAL_COMMUNITY): Payer: Medicare Other

## 2019-01-03 DIAGNOSIS — R1084 Generalized abdominal pain: Secondary | ICD-10-CM | POA: Diagnosis not present

## 2019-01-03 DIAGNOSIS — I5032 Chronic diastolic (congestive) heart failure: Secondary | ICD-10-CM | POA: Insufficient documentation

## 2019-01-03 DIAGNOSIS — R109 Unspecified abdominal pain: Secondary | ICD-10-CM | POA: Diagnosis not present

## 2019-01-03 DIAGNOSIS — R52 Pain, unspecified: Secondary | ICD-10-CM | POA: Diagnosis not present

## 2019-01-03 DIAGNOSIS — I130 Hypertensive heart and chronic kidney disease with heart failure and stage 1 through stage 4 chronic kidney disease, or unspecified chronic kidney disease: Secondary | ICD-10-CM | POA: Insufficient documentation

## 2019-01-03 DIAGNOSIS — Z8709 Personal history of other diseases of the respiratory system: Secondary | ICD-10-CM | POA: Insufficient documentation

## 2019-01-03 DIAGNOSIS — N200 Calculus of kidney: Secondary | ICD-10-CM | POA: Diagnosis not present

## 2019-01-03 DIAGNOSIS — R112 Nausea with vomiting, unspecified: Secondary | ICD-10-CM | POA: Diagnosis not present

## 2019-01-03 DIAGNOSIS — M25662 Stiffness of left knee, not elsewhere classified: Secondary | ICD-10-CM | POA: Diagnosis not present

## 2019-01-03 DIAGNOSIS — N183 Chronic kidney disease, stage 3 unspecified: Secondary | ICD-10-CM | POA: Insufficient documentation

## 2019-01-03 DIAGNOSIS — R1111 Vomiting without nausea: Secondary | ICD-10-CM | POA: Diagnosis not present

## 2019-01-03 DIAGNOSIS — M25562 Pain in left knee: Secondary | ICD-10-CM | POA: Diagnosis not present

## 2019-01-03 DIAGNOSIS — R110 Nausea: Secondary | ICD-10-CM | POA: Diagnosis not present

## 2019-01-03 LAB — URINALYSIS, ROUTINE W REFLEX MICROSCOPIC
Bilirubin Urine: NEGATIVE
Glucose, UA: NEGATIVE mg/dL
Hgb urine dipstick: NEGATIVE
Ketones, ur: 5 mg/dL — AB
Nitrite: NEGATIVE
Protein, ur: NEGATIVE mg/dL
Specific Gravity, Urine: 1.026 (ref 1.005–1.030)
pH: 5 (ref 5.0–8.0)

## 2019-01-03 LAB — CBC
HCT: 35.5 % — ABNORMAL LOW (ref 36.0–46.0)
Hemoglobin: 10.9 g/dL — ABNORMAL LOW (ref 12.0–15.0)
MCH: 29.2 pg (ref 26.0–34.0)
MCHC: 30.7 g/dL (ref 30.0–36.0)
MCV: 95.2 fL (ref 80.0–100.0)
Platelets: 205 10*3/uL (ref 150–400)
RBC: 3.73 MIL/uL — ABNORMAL LOW (ref 3.87–5.11)
RDW: 13.9 % (ref 11.5–15.5)
WBC: 7.2 10*3/uL (ref 4.0–10.5)
nRBC: 0 % (ref 0.0–0.2)

## 2019-01-03 LAB — COMPREHENSIVE METABOLIC PANEL
ALT: 13 U/L (ref 0–44)
AST: 18 U/L (ref 15–41)
Albumin: 3.4 g/dL — ABNORMAL LOW (ref 3.5–5.0)
Alkaline Phosphatase: 67 U/L (ref 38–126)
Anion gap: 18 — ABNORMAL HIGH (ref 5–15)
BUN: 17 mg/dL (ref 8–23)
CO2: 21 mmol/L — ABNORMAL LOW (ref 22–32)
Calcium: 9.9 mg/dL (ref 8.9–10.3)
Chloride: 101 mmol/L (ref 98–111)
Creatinine, Ser: 1.14 mg/dL — ABNORMAL HIGH (ref 0.44–1.00)
GFR calc Af Amer: 58 mL/min — ABNORMAL LOW (ref >60)
GFR calc non Af Amer: 50 mL/min — ABNORMAL LOW (ref >60)
Glucose, Bld: 93 mg/dL (ref 70–99)
Potassium: 4.1 mmol/L (ref 3.5–5.1)
Sodium: 140 mmol/L (ref 135–145)
Total Bilirubin: 0.8 mg/dL (ref 0.3–1.2)
Total Protein: 7.3 g/dL (ref 6.5–8.1)

## 2019-01-03 LAB — LIPASE, BLOOD: Lipase: 22 U/L (ref 11–51)

## 2019-01-03 MED ORDER — SODIUM CHLORIDE 0.9% FLUSH: 3.0000 mL | Freq: Once | INTRAVENOUS | Status: DC

## 2019-01-03 MED ORDER — MORPHINE SULFATE (PF) 4 MG/ML IV SOLN
4.0000 mg | INTRAVENOUS | Status: DC | PRN
  Administered 2019-01-03: 4 mg via INTRAVENOUS
  Filled 2019-01-03: qty 1

## 2019-01-03 MED ORDER — ONDANSETRON HCL 4 MG/2ML IJ SOLN
4.0000 mg | Freq: Once | INTRAMUSCULAR | Status: AC
  Administered 2019-01-03: 4 mg via INTRAVENOUS
  Filled 2019-01-03: qty 2

## 2019-01-03 NOTE — ED Provider Notes (Signed)
Emergency Department Provider Note   I have reviewed the triage vital signs and the nursing notes.   HISTORY  Chief Complaint Abdominal Pain   HPI Jillian Eaton is a 68 y.o. female with PMH reviewed below and significant for prior SBO presents to the emergency department for evaluation of abdominal pain with nausea vomiting.  Symptoms began 4 days ago and have significantly worsened over the past 24 hours.  Patient is not passing flatus or stool per rectum.  She is having nonbloody emesis.  No fevers or chills.  No chest pain or shortness of breath.  She states that initially her pain was mainly left-sided and did not feel typical of her bowel obstruction pain but today the pain began to find her more of that type of pain.  No history of diverticulitis.  No dysuria, hesitancy, urgency.   Past Medical History:  Diagnosis Date  . Anemia   . Asthma    flare up last yr  . CHF (congestive heart failure) ([place])   . Chronic headache    " better now "   . DOE (dyspnea on exertion)    2D ECHO, 02/12/2012 - EF 60-65%, moderate concentric hypertrophy  . Fibromyalgia    nerve pain"left side at waist level" "can't lay on that side without pain" , "HOB elevation helps"; 9-17- since i lost the wieght , i dont  think i have this anymore   . Heart murmur   . Hematuria - cause not known    resolved   . History of kidney stones    x 2 '13, '14 surgery to remove  . Hypertension   . SBO (small bowel obstruction) ([place]) 06/07/2013  . SBO (small bowel obstruction) ([place])    rqueired admission 2020  . Sleep apnea    tested several times and came up negative  . Thyroid disease    "goiter"  . Transfusion history    10 yrs+    Patient Active Problem List   Diagnosis Date Noted  . Acute on chronic respiratory failure with hypoxia ([place]) 12/07/2018  . Hypothyroidism 12/07/2018  . Normocytic anemia 12/07/2018  . Asthma 12/07/2018  . S/P left TKA 11/14/2018  . Status post total left  knee replacement 11/14/2018  . Degenerative joint disease involving multiple joints on both sides of body 07/31/2018  . Rotator cuff tear arthropathy of right shoulder 04/05/2018  . Overactive bladder 04/05/2018  . Steroid-induced hyperglycemia   . Generalized OA   . 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 NICM (nonischemic cardiomyopathy) ([place]) 01/15/2018  . Osteoporosis 12/13/2017  . Rotator cuff tear arthropathy 11/29/2017  . Chronic diastolic CHF (congestive heart failure) ([place]) 09/28/2017  . Acute renal failure superimposed on stage 3 chronic kidney disease ([place]) 09/07/2017  . Hypomagnesemia with secondary hypocalcemia 09/07/2017  . 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 Hypercapnemia 07/13/2017  . AKI (acute kidney injury) ([place]) 07/10/2017  . Vertigo 07/08/2017  . Blurring of visual image 07/08/2017  . On anticoagulant therapy 06/19/2017  . Chronic respiratory failure with hypoxia ([place]) 04/19/2017  . GERD (gastroesophageal reflux disease) 08/28/2016  . 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 Depressive disorder 08/28/2016  . Iatrogenic hypocalcemia 08/28/2016  . Chronic pain syndrome   . Acute diastolic (congestive) heart failure ([place])   . Oropharyngeal dysphagia   . Chronic atrial fibrillation ([place]) 04/28/2016  . Vocal cord dysfunction 04/28/2016  . Thrombocytopenia ([place]) 04/28/2016  . Seizures ([place])   . Preoperative cardiovascular examination   .  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  . Leg swelling 10/19/2015  . Acute on chronic respiratory failure with hypoxia and hypercapnia ([place]) 08/03/2015  . Bilateral leg edema 05/11/2015  . Essential hypertension 05/11/2015  . Nephrolithiasis 04/12/2015  . Numbness in both hands 04/12/2015  . Complete tear of left rotator cuff 11/19/2014  . Primary osteoarthritis of both knees 03/24/2014  . Chronic asthmatic bronchitis ([place]) 07/30/2013  . Bariatric surgery status 07/30/2013  . Nontoxic  multinodular goiter 07/30/2013  . Urge incontinence of urine 07/30/2013  . Vitamin D deficiency 07/30/2013  . B-complex deficiency 07/30/2013  . Cough 07/30/2013  . Gross hematuria 07/30/2013  . Goiter 07/30/2013  . Palpitations 07/30/2013  . Right flank pain 07/30/2013  . SBO (small bowel obstruction) ([place]) 06/07/2013  . Pulmonary HTN ([place]) 01/08/2013  . Insomnia 03/22/2012  . Mild intermittent asthma with acute exacerbation 03/22/2012  . Fibromyalgia 10/26/2011  . Anemia of chronic disease 03/28/2011  . Morbid obesity with body mass index of 40.0-49.9 ([place]) 09/22/2010    Past Surgical History:  Procedure Laterality Date  . ABDOMINAL ADHESION SURGERY  2004   open LOA - in [place]  . CARDIAC CATHETERIZATION  04/04/2010   No significant obstructive coronary artery disease  . CARDIOVERSION N/A 08/08/2017   Procedure: CARDIOVERSION;  Surgeon: Pixie Casino, MD;  Location: [facility];  Service: Cardiovascular;  Laterality: N/A;  . CHOLECYSTECTOMY  1990  . COLONOSCOPY W/ POLYPECTOMY    . COLONOSCOPY WITH PROPOFOL N/A 04/10/2014   Procedure: COLONOSCOPY WITH PROPOFOL;  Surgeon: Beryle Beams, MD;  Location: [facility];  Service: Endoscopy;  Laterality: N/A;  . EYE SURGERY     lasik 20-25 yrs ago  . GASTROPLASTY VERTICAL BANDED  1985  . [place]   In [place] for SBO  . REVERSE SHOULDER ARTHROPLASTY Right 03/29/2018   Procedure: REVERSE SHOULDER ARTHROPLASTY;  Surgeon: Netta Cedars, MD;  Location: [facility];  Service: Orthopedics;  Laterality: Right;  . ROUX-EN-Y GASTRIC BYPASS  1992   Conversion VBG to RnYGB in [place], [place]  . SHOULDER ARTHROSCOPY WITH SUBACROMIAL DECOMPRESSION  2016   Dr Berenice Primas, [place], [place]  . SMALL INTESTINE SURGERY  2000   SBO - LOA w SB resection  . TOTAL KNEE ARTHROPLASTY Left 11/14/2018   Procedure: TOTAL KNEE ARTHROPLASTY;  Surgeon: Paralee Cancel, MD;  Location: [facility];  Service: Orthopedics;  Laterality: Left;  70  mins  . TOTAL THYROIDECTOMY  06/26/2012   Dr Celine Ahr, [facility]  . TUBAL LIGATION  1986  . URETHRAL DILATION  2012   w cystoscopy.  Dr Gaynelle Arabian    Allergies Hydrocodone  Family History  Problem Relation Age of Onset  . Diabetes Mother   . Epilepsy Mother   . Cancer Mother        Breast  . Hypertension Mother   . Breast cancer Mother   . Seizures Mother   . Kidney disease Father   . Diabetes Father   . Hypertension Father   . Asthma Father   . Heart disease Father   . Epilepsy Sister   . Cancer Maternal Grandmother   . Breast cancer Maternal Grandmother   . Cancer Paternal Grandmother   . Breast cancer Paternal Grandmother     Social History Social History   Tobacco Use  . Smoking status: Never Smoker  . Smokeless tobacco: Never Used  Substance Use Topics  . Alcohol use: No    Comment: wine occ  . Drug use:  No    Types: Oxycodone    Comment: perscribed    Review of Systems  Constitutional: No fever/chills Eyes: No visual changes. ENT: No sore throat. Cardiovascular: Denies chest pain. Respiratory: Denies shortness of breath. Gastrointestinal: Positive abdominal pain. Positive nausea and vomiting.  No diarrhea.  Genitourinary: Negative for dysuria. Musculoskeletal: Negative for back pain. Skin: Negative for rash. Neurological: Negative for headaches, focal weakness or numbness.  10-point ROS otherwise negative.  ____________________________________________   PHYSICAL EXAM:  VITAL SIGNS: ED Triage Vitals  Enc Vitals Group     BP 01/03/19 1747 (!) 129/91     Pulse Rate 01/03/19 1747 94     Resp 01/03/19 1747 17     Temp 01/03/19 1747 98.3 F (36.8 C)     Temp Source 01/03/19 1747 Oral     SpO2 01/03/19 1747 97 %   Constitutional: Alert and oriented. Well appearing and in no acute distress. Eyes: Conjunctivae are normal.  Head: Atraumatic. Nose: No congestion/rhinnorhea. Mouth/Throat: Mucous membranes are moist.   Neck: No stridor.    Cardiovascular: Normal rate, regular rhythm. Good peripheral circulation. Grossly normal heart sounds.   Respiratory: Normal respiratory effort.  No retractions. Lungs CTAB. Gastrointestinal: Soft with mild diffuse tenderness. No rebound or guarding. Mild distention.  Musculoskeletal: No lower extremity tenderness nor edema. No gross deformities of extremities. Neurologic:  Normal speech and language.  Skin:  Skin is warm, dry and intact. No rash noted.  ____________________________________________   LABS (all labs ordered are listed, but only abnormal results are displayed)  Labs Reviewed  COMPREHENSIVE METABOLIC PANEL - Abnormal; Notable for the following components:      Result Value   CO2 21 (*)    Creatinine, Ser 1.14 (*)    Albumin 3.4 (*)    GFR calc non Af Amer 50 (*)    GFR calc Af Amer 58 (*)    Anion gap 18 (*)    All other components within normal limits  CBC - Abnormal; Notable for the following components:   RBC 3.73 (*)    Hemoglobin 10.9 (*)    HCT 35.5 (*)    All other components within normal limits  URINALYSIS, ROUTINE W REFLEX MICROSCOPIC - Abnormal; Notable for the following components:   APPearance HAZY (*)    Ketones, ur 5 (*)    Leukocytes,Ua TRACE (*)    Bacteria, UA RARE (*)    All other components within normal limits  LIPASE, BLOOD   ____________________________________________  RADIOLOGY  Dg Abdomen Acute W/chest  Result Date: 01/03/2019 CLINICAL DATA:  Concern for small bowel obstruction. Left-sided abdominal pain. EXAM: DG ABDOMEN ACUTE W/ 1V CHEST COMPARISON:  August 03, 2018 FINDINGS: The heart size is enlarged but stable from prior study. The pulmonary arteries are dilated which can be seen in patients with elevated pulmonary artery pressures. There is no pneumothorax. There is atelectasis at the lung bases. There is likely a trace right-sided effusion. The bowel gas pattern is nonspecific with gaseous distention of loops of colon and small  bowel scattered throughout the abdomen. There is a moderate stool burden. No pneumatosis or free air. IMPRESSION: 1. Nonspecific bowel gas pattern with gaseous distention of loops of colon and small bowel. No pneumatosis or free air. 2. Moderate stool burden. 3. No acute cardiopulmonary process. 4. Cardiomegaly. Trace right-sided pleural effusion. Bibasilar atelectasis. Electronically Signed   By: Constance Holster M.D.   On: 01/03/2019 20:31    ____________________________________________   PROCEDURES  Procedure(s) performed:  Procedures  None ____________________________________________   INITIAL IMPRESSION / ASSESSMENT AND PLAN / ED COURSE  Pertinent labs & imaging results that were available during my care of the patient were reviewed by me and considered in my medical decision making (see chart for details).   Patient with prior history of bowel obstruction presents to the emergency department with symptoms similar to prior bowel obstructions per the patient.  She has mild diffuse tenderness with no peritoneal findings.  Plain films reviewed from triage are nonspecific.  Given patient's high clinical risk for bowel obstruction requiring surgery plan for CT imaging of the abdomen and pelvis.  Lab work reviewed with no acute findings.  Care transferred to Dr. Betsey Holiday pending CT.  ____________________________________________  FINAL CLINICAL IMPRESSION(S) / ED DIAGNOSES  Final diagnoses:  Generalized abdominal pain     MEDICATIONS GIVEN DURING THIS VISIT:  Medications  ondansetron (ZOFRAN) injection 4 mg (4 mg Intravenous Given 01/03/19 2342)  iohexol (OMNIPAQUE) 300 MG/ML solution 125 mL (125 mLs Intravenous Contrast Given 01/04/19 0006)  hyoscyamine (LEVSIN SL) SL tablet 0.125 mg (0.125 mg Oral Given 01/04/19 0051)  ondansetron (ZOFRAN) injection 4 mg (4 mg Intravenous Given 01/04/19 0051)     NEW OUTPATIENT MEDICATIONS STARTED DURING THIS VISIT:  Discharge Medication  List as of 01/04/2019 12:46 AM    START taking these medications   Details  hyoscyamine (LEVSIN SL) 0.125 MG SL tablet Place 1 tablet (0.125 mg total) under the tongue every 4 (four) hours as needed for cramping (abdominal pain)., Starting Sat 01/04/2019, Normal    ondansetron (ZOFRAN) 4 MG tablet Take 1 tablet (4 mg total) by mouth every 6 (six) hours as needed for nausea or vomiting., Starting Sat 01/04/2019, Normal    polyethylene glycol (MIRALAX / GLYCOLAX) 17 g packet Take 17 g by mouth daily as needed for moderate constipation., Starting Sat 01/04/2019, Normal        Note:  This document was prepared using Dragon voice recognition software and may include unintentional dictation errors.  Nanda Quinton, MD, [facility] Emergency Medicine    , [facility], MD 01/04/19 [phone]

## 2019-01-03 NOTE — ED Triage Notes (Signed)
Brought by ems from home for c/o Lsided abdominal pain with n/v that started a week ago but slowly getting worse.  Hx of SBO.  Reports it feels the same.  Describes as cramping.  Reports vomiting 10 times today.  Normal bm yesterday.

## 2019-01-04 ENCOUNTER — Encounter (HOSPITAL_COMMUNITY): Payer: Self-pay | Admitting: Radiology

## 2019-01-04 ENCOUNTER — Emergency Department (HOSPITAL_COMMUNITY): Payer: Medicare Other

## 2019-01-04 DIAGNOSIS — R1084 Generalized abdominal pain: Secondary | ICD-10-CM | POA: Diagnosis not present

## 2019-01-04 DIAGNOSIS — N200 Calculus of kidney: Secondary | ICD-10-CM | POA: Diagnosis not present

## 2019-01-04 MED ORDER — HYOSCYAMINE SULFATE 0.125 MG SL SUBL: 0.1250 mg | SUBLINGUAL_TABLET | SUBLINGUAL | 0 refills | Status: DC | PRN

## 2019-01-04 MED ORDER — ONDANSETRON HCL 4 MG/2ML IJ SOLN
4.0000 mg | Freq: Once | INTRAMUSCULAR | Status: AC
  Administered 2019-01-04: 4 mg via INTRAVENOUS
  Filled 2019-01-04: qty 2

## 2019-01-04 MED ORDER — POLYETHYLENE GLYCOL 3350 17 G PO PACK: 17.0000 g | PACK | Freq: Every day | ORAL | 0 refills | Status: DC | PRN

## 2019-01-04 MED ORDER — ONDANSETRON HCL 4 MG PO TABS: 4.0000 mg | ORAL_TABLET | Freq: Four times a day (QID) | ORAL | 0 refills | Status: DC | PRN

## 2019-01-04 MED ORDER — IOHEXOL 300 MG/ML  SOLN
125.0000 mL | Freq: Once | INTRAMUSCULAR | Status: AC | PRN
  Administered 2019-01-04: 125 mL via INTRAVENOUS

## 2019-01-04 MED ORDER — HYOSCYAMINE SULFATE 0.125 MG SL SUBL
0.1250 mg | SUBLINGUAL_TABLET | Freq: Once | SUBLINGUAL | Status: AC
  Administered 2019-01-04: 0.125 mg via ORAL
  Filled 2019-01-04: qty 1

## 2019-01-04 NOTE — ED Provider Notes (Signed)
Patient presents to the emergency department for evaluation of abdominal pain.  Patient does report a history of small bowel obstruction.  She was seen by Dr. Laverta Baltimore and signed out to me to follow-up on CT scan.  Scan has now been performed and results do not show any evidence of obstruction or acute abnormality.  Upon recheck after CT she is no longer having any nausea or vomiting, is comfortable without any active pain.  I will therefore discharge her with symptomatic treatment, follow-up with PCP.  Vitals:   01/03/19 1747 01/03/19 2038  BP: (!) 129/91 131/81  Pulse: 94   Resp: 17   Temp: 98.3 F (36.8 C)   SpO2: 97%    Results for orders placed or performed during the hospital encounter of 01/03/19  Lipase, blood  Result Value Ref Range   Lipase 22 11 - 51 U/L  Comprehensive metabolic panel  Result Value Ref Range   Sodium 140 135 - 145 mmol/L   Potassium 4.1 3.5 - 5.1 mmol/L   Chloride 101 98 - 111 mmol/L   CO2 21 (L) 22 - 32 mmol/L   Glucose, Bld 93 70 - 99 mg/dL   BUN 17 8 - 23 mg/dL   Creatinine, Ser 1.14 (H) 0.44 - 1.00 mg/dL   Calcium 9.9 8.9 - 10.3 mg/dL   Total Protein 7.3 6.5 - 8.1 g/dL   Albumin 3.4 (L) 3.5 - 5.0 g/dL   AST 18 15 - 41 U/L   ALT 13 0 - 44 U/L   Alkaline Phosphatase 67 38 - 126 U/L   Total Bilirubin 0.8 0.3 - 1.2 mg/dL   GFR calc non Af Amer 50 (L) >60 mL/min   GFR calc Af Amer 58 (L) >60 mL/min   Anion gap 18 (H) 5 - 15  CBC  Result Value Ref Range   WBC 7.2 4.0 - 10.5 K/uL   RBC 3.73 (L) 3.87 - 5.11 MIL/uL   Hemoglobin 10.9 (L) 12.0 - 15.0 g/dL   HCT 35.5 (L) 36.0 - 46.0 %   MCV 95.2 80.0 - 100.0 fL   MCH 29.2 26.0 - 34.0 pg   MCHC 30.7 30.0 - 36.0 g/dL   RDW 13.9 11.5 - 15.5 %   Platelets 205 150 - 400 K/uL   nRBC 0.0 0.0 - 0.2 %  Urinalysis, Routine w reflex microscopic  Result Value Ref Range   Color, Urine YELLOW YELLOW   APPearance HAZY (A) CLEAR   Specific Gravity, Urine 1.026 1.005 - 1.030   pH 5.0 5.0 - 8.0   Glucose, UA NEGATIVE  NEGATIVE mg/dL   Hgb urine dipstick NEGATIVE NEGATIVE   Bilirubin Urine NEGATIVE NEGATIVE   Ketones, ur 5 (A) NEGATIVE mg/dL   Protein, ur NEGATIVE NEGATIVE mg/dL   Nitrite NEGATIVE NEGATIVE   Leukocytes,Ua TRACE (A) NEGATIVE   RBC / HPF 6-10 0 - 5 RBC/hpf   WBC, UA 6-10 0 - 5 WBC/hpf   Bacteria, UA RARE (A) NONE SEEN   Squamous Epithelial / LPF 0-5 0 - 5   Mucus PRESENT    Ca Oxalate Crys, UA PRESENT    Dg Chest 2 View  Result Date: 12/06/2018 CLINICAL DATA:  Chest pain a few days ago.  Shortness of breath. EXAM: CHEST - 2 VIEW COMPARISON:  None. FINDINGS: Stable cardiomegaly. The hila and mediastinum are normal. No pneumothorax. No nodules or masses. No focal infiltrates or overt edema. IMPRESSION: No active cardiopulmonary disease. Electronically Signed   By: Dorise Bullion  III M.D   On: 12/06/2018 15:53   Ct Angio Chest Pe W And/or Wo Contrast  Result Date: 12/06/2018 CLINICAL DATA:  PE suspected, low pretest probability EXAM: CT ANGIOGRAPHY CHEST WITH CONTRAST TECHNIQUE: Multidetector CT imaging of the chest was performed using the standard protocol during bolus administration of intravenous contrast. Multiplanar CT image reconstructions and MIPs were obtained to evaluate the vascular anatomy. CONTRAST:  150mL OMNIPAQUE IOHEXOL 350 MG/ML SOLN COMPARISON:  Radiograph 12/06/2018, CTA 09/21/2017 FINDINGS: Cardiovascular: Satisfactory opacification of the pulmonary arteries to the segmental level. No evidence of pulmonary embolism. Marked enlargement of the central pulmonary arteries is similar to comparison study. No CT evidence of right heart strain. No elevation of the RV/LV ratio (0.65). Mild cardiomegaly with biatrial enlargement, right greater than left. There is reflux of contrast into the hepatic veins. Small amount of contrast material within the right atrium likely related to intravenous access. Aorta is normal caliber. Shared origin of the brachiocephalic and left common carotid  artery. Proximal great vessels are unremarkable. There is distention of the azygos and hemi azygous veins. Mediastinum/Nodes: No enlarged mediastinal or axillary lymph nodes. Thyroid gland, trachea, and esophagus demonstrate no significant findings. Lungs/Pleura: Areas of mosaic attenuation within the lungs may reflect changes seen with imaging during exhalation. Ground-glass opacity seen posteriorly in the right upper lobe could reflect additional volume loss or a developing airspace process. More bandlike opacities in the right lung base may reflect scarring given presence comparison study from 2019. No pneumothorax. No effusion. No suspicious pulmonary nodules or masses. Upper Abdomen: Extensive postsurgical changes at the diaphragmatic hiatus compatible with history of gastroplasty and gastric bypass. Patient is post cholecystectomy. There is notable fluid distention of the duodenal sweep, nonspecific given the postsurgical changes. Nonobstructing calcifications are present in the interpolar and lower pole right kidney. Musculoskeletal: Multilevel degenerative changes are present in the imaged portions of the spine. Chest wall deformity is similar to comparison. Superior endplate changes of [id number] and inferior endplate changes of T5 are unchanged from comparison study.No acute osseous abnormality or suspicious osseous lesion. No suspicious chest wall lesions. Postsurgical changes from right shoulder reverse arthroplasty. Intra-articular body noted in the posterior recess of the left glenohumeral joint. Review of the MIP images confirms the above findings. IMPRESSION: 1. No evidence of pulmonary embolism. 2. Chronic central pulmonary arterial enlargement compatible with pulmonary artery hypertension. 3. Mild cardiomegaly with biatrial enlargement, right greater than left. 4. Reflux of contrast into the hepatic veins, suggestive of elevated right heart pressures. Distention of the azygos and hemi azygous veins  suggesting suggest increased preload. 5. Ground-glass opacity posteriorly in the right upper lobe could reflect volume loss or a developing airspace process. Difficult to discern given imaging during exhalation. 6. Extensive postsurgical changes at the diaphragmatic hiatus compatible with history of gastroplasty and gastric bypass. There is notable fluid distention of the duodenal sweep, nonspecific given the postsurgical changes. 7. Nonobstructing right nephrolithiasis. 8. Right shoulder hemiarthroplasty.  No acute complication. 9. Intra-articular body of the left shoulder recess. Stable from prior. Electronically Signed   By: Lovena Le M.D.   On: 12/06/2018 23:18   Ct Abdomen Pelvis W Contrast  Result Date: 01/04/2019 CLINICAL DATA:  Left-sided abdominal pain, history of small-bowel obstruction EXAM: CT ABDOMEN AND PELVIS WITH CONTRAST TECHNIQUE: Multidetector CT imaging of the abdomen and pelvis was performed using the standard protocol following bolus administration of intravenous contrast. CONTRAST:  129mL OMNIPAQUE IOHEXOL 300 MG/ML  SOLN COMPARISON:  07/31/2018 FINDINGS: Lower chest: No  acute abnormality.  Cardiac shadow is enlarged. Hepatobiliary: Gallbladder has been surgically removed. Biliary ductal dilatation is noted consistent with patient's post cholecystectomy state. The liver is stable in appearance when compare with the prior study. Pancreas: Unremarkable. No pancreatic ductal dilatation or surrounding inflammatory changes. Spleen: Normal in size without focal abnormality. Adrenals/Urinary Tract: Adrenal glands are within normal limits. The kidneys demonstrate right renal calculi without obstructive change. The bladder is decompressed. Stomach/Bowel: The appendix is within normal limits. No obstructive or inflammatory changes of the colon are seen. The proximal duodenum is mildly dilated this is similar to that seen on prior CT examination. No small bowel obstructive changes are seen.  Vascular/Lymphatic: No significant vascular findings are present. No enlarged abdominal or pelvic lymph nodes. Reproductive: Multiple calcified uterine fibroids are noted stable from the prior exam. Other: No abdominal wall hernia or abnormality. No abdominopelvic ascites. Musculoskeletal: Degenerative changes of lumbar spine are noted. IMPRESSION: Right renal calculi stable from the prior exam. Status post cholecystectomy with biliary ductal dilatation also stable from the prior study. No small bowel or large bowel obstructive changes are seen. Chronic changes as described. Electronically Signed   By: Inez Catalina M.D.   On: 01/04/2019 00:29   Dg Abdomen Acute W/chest  Result Date: 01/03/2019 CLINICAL DATA:  Concern for small bowel obstruction. Left-sided abdominal pain. EXAM: DG ABDOMEN ACUTE W/ 1V CHEST COMPARISON:  August 03, 2018 FINDINGS: The heart size is enlarged but stable from prior study. The pulmonary arteries are dilated which can be seen in patients with elevated pulmonary artery pressures. There is no pneumothorax. There is atelectasis at the lung bases. There is likely a trace right-sided effusion. The bowel gas pattern is nonspecific with gaseous distention of loops of colon and small bowel scattered throughout the abdomen. There is a moderate stool burden. No pneumatosis or free air. IMPRESSION: 1. Nonspecific bowel gas pattern with gaseous distention of loops of colon and small bowel. No pneumatosis or free air. 2. Moderate stool burden. 3. No acute cardiopulmonary process. 4. Cardiomegaly. Trace right-sided pleural effusion. Bibasilar atelectasis. Electronically Signed   By: Constance Holster M.D.   On: 01/03/2019 20:31   Mm 3d Screen Breast Bilateral  Result Date: 12/14/2018 CLINICAL DATA:  Screening. EXAM: DIGITAL SCREENING BILATERAL MAMMOGRAM WITH TOMO AND CAD COMPARISON:  Previous exam(s). ACR Breast Density Category a: The breast tissue is almost entirely fatty. FINDINGS: There are  no findings suspicious for malignancy. Images were processed with CAD. IMPRESSION: No mammographic evidence of malignancy. A result letter of this screening mammogram will be mailed directly to the patient. RECOMMENDATION: Screening mammogram in one year. (Code:SM-B-01Y) BI-RADS CATEGORY  1: Negative. Electronically Signed   By: Ammie Ferrier M.D.   On: 12/14/2018 13:36      Pollina, Gwenyth Allegra, MD 01/04/19 [phone]

## 2019-01-06 ENCOUNTER — Telehealth: Payer: Self-pay

## 2019-01-06 ENCOUNTER — Encounter: Payer: Self-pay | Admitting: Internal Medicine

## 2019-01-06 ENCOUNTER — Ambulatory Visit (INDEPENDENT_AMBULATORY_CARE_PROVIDER_SITE_OTHER): Payer: Medicare Other | Admitting: Internal Medicine

## 2019-01-06 VITALS — BP 130/70 | HR 85 | Ht 66.0 in | Wt 222.0 lb

## 2019-01-06 DIAGNOSIS — E890 Postprocedural hypothyroidism: Secondary | ICD-10-CM | POA: Diagnosis not present

## 2019-01-06 DIAGNOSIS — K5900 Constipation, unspecified: Secondary | ICD-10-CM | POA: Diagnosis not present

## 2019-01-06 DIAGNOSIS — R109 Unspecified abdominal pain: Secondary | ICD-10-CM | POA: Diagnosis not present

## 2019-01-06 DIAGNOSIS — R6881 Early satiety: Secondary | ICD-10-CM | POA: Diagnosis not present

## 2019-01-06 DIAGNOSIS — I639 Cerebral infarction, unspecified: Secondary | ICD-10-CM | POA: Diagnosis not present

## 2019-01-06 DIAGNOSIS — C73 Malignant neoplasm of thyroid gland: Secondary | ICD-10-CM | POA: Diagnosis not present

## 2019-01-06 DIAGNOSIS — M810 Age-related osteoporosis without current pathological fracture: Secondary | ICD-10-CM | POA: Diagnosis not present

## 2019-01-06 DIAGNOSIS — L03119 Cellulitis of unspecified part of limb: Secondary | ICD-10-CM | POA: Diagnosis not present

## 2019-01-06 MED ORDER — CALCITRIOL 0.5 MCG PO CAPS: 0.5000 ug | ORAL_CAPSULE | Freq: Two times a day (BID) | ORAL | 3 refills | Status: DC

## 2019-01-06 NOTE — Progress Notes (Signed)
Patient ID: Jillian Eaton, female   DOB: 04-Oct-1950, 68 y.o.   MRN: [id number]    HPI  Jillian Eaton is a 68 y.o.-year-old female, initially referred by her PCP, Dr. Sabas Sous, returning for h/o thyroid microcarcinoma,  postsurgical hypothyroidism, postsurgical hypocalcemia, osteoporosis. Last visit 6 months ago.  Since last visit, she was admitted 07/2018 with SBO and 11/2018 with acute on chronic respiratory failure and pulmonary hypertension.  Patient is planning to have right TKR 02/18/2019.  She had left TKR 11/14/2018.  She also had a seizure 05/2018.  She was suspected for stroke, but this was ruled out. Of note, upon questioning, she stopped Lyrica 1 week prior to the seizure.  She was taking this for fibromyalgia.  She was started on Keppra.  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 Fredirick Maudlin at [facility].  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  She developed postsurgical hypothyroidism and, unfortunately, also postsurgical hypocalcemia, both uncontrolled.  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 Benign hyperplastic thyroid tissue. See comment. COMMENT: The papillary carcinoma appears to be incidental.  On 06/19/2017 she developed atrial fibrillation.  At that time, her TSH is very suppressed, at 0.07.  She was previously on 300 mcg levothyroxine daily, then decrease to 200 mcg daily: - in am (5 AM) - fasting - at least 30 min from b'fast - no Fe, MVI, PPIs - she was on calcium at 8:30 AM - now off - not on Biotin She was previously on iron infusion, then p.o. iron, now off.  Reviewed her TFTs: 05/08/2018: TSH 2.87 Lab Results   Component Value Date   TSH 1.21 11/27/2017   TSH 0.034 (L) 09/21/2017   TSH 1.48 08/03/2017   TSH 0.007 (L) 06/19/2017   TSH 0.046 (L) 08/29/2016   TSH 9.239 (H) 04/25/2016   TSH 68.907 (H) 03/10/2016   FREET4 1.41 11/27/2017   FREET4 2.87 (H) 09/21/2017   FREET4 1.04 08/03/2017   FREET4 2.43 (H) 06/19/2017   FREET4 1.04 08/29/2016   Pt denies: - feeling nodules in neck - hoarseness - choking - SOB with lying down But has chronic dysphagia  No FH of thyroid cancer. No h/o radiation tx to head or neck.  No herbal supplements. No Biotin use. No recent steroids use.   Hypocalcemia:  Iatrogenic, after thyroid surgery, despite the fact the parathyroid gland resected.  Her PTH was inappropriately normal, possibly related to her CKD.  She was admitted with hypercalcemia and acute respiratory failure on 07/10/2017.  Previously: Lab Results  Component Value Date   PTH 40 11/27/2017   PTH 44 08/03/2017   PTH 65 07/10/2017   PTH Comment 07/10/2017   CALCIUM 9.9 01/03/2019   CALCIUM 8.4 (L) 12/09/2018   CALCIUM 8.3 (L) 12/08/2018   CALCIUM 8.5 (L) 12/07/2018   CALCIUM 8.8 (L) 12/06/2018   CALCIUM 8.0 (L) 11/27/2018   CALCIUM 9.0 11/20/2018   CALCIUM 8.1 (L) 11/15/2018   CALCIUM 8.5 (L) 11/07/2018   CALCIUM 7.8 (L) 10/02/2018  10/09/2017: Ca 8.1 (8.7-10.3)  She has a history of low magnesium and high phosphorus.  Magnesium was low at last check: Lab Results  Component Value Date   MG 1.4 (L) 12/07/2018   MG 1.6 11/27/2017   MG 1.5 08/03/2017  MG 2.1 07/15/2017   MG 1.6 (L) 07/14/2017   MG 1.3 (L) 07/13/2017   MG 1.2 (L) 07/10/2017   MG 1.8 07/11/2016   MG 2.2 07/09/2016   MG 1.6 (L) 07/09/2016   MG 1.9 07/08/2016   MG 2.0 07/08/2016   MG 1.6 (L) 07/08/2016   MG 1.6 (L) 05/03/2016   MG 1.8 04/25/2016   MG 1.9 04/25/2016   MG 2.0 04/24/2016   MG 1.2 (L) 04/23/2016   MG 2.2 03/12/2016   MG 1.4 (L) 03/10/2016  10/09/2017: Mg 2.0 (1.6-2.3)  Lab Results   Component Value Date   PHOS 3.8 11/27/2017   PHOS 6.3 (H) 08/03/2017   PHOS 7.4 (H) 07/10/2017   PHOS 3.6 07/11/2016   PHOS 4.0 07/09/2016   PHOS 3.5 07/09/2016   PHOS 5.2 (H) 07/08/2016   PHOS 6.6 (H) 07/08/2016   PHOS 8.0 (H) 07/08/2016   PHOS 3.6 04/25/2016   PHOS 3.5 04/25/2016   PHOS 3.3 04/24/2016   PHOS 3.7 03/12/2016   PHOS 6.2 (H) 03/10/2016  10/09/2017: phos 4.5 (2.5-4.5)  Labs from Dr. Maia Petties, patient's PCP, drawn on 05/08/2018: -CMP: Glucose 29 (!),  BUN/creatinine 19/0.94, GFR 74, calcium 8.3 (8.7-10.3), Albumin 3.6 (3.8-4.8) -Vitamin D 39.8 -TSH 2.87 -Phosphorus 3.6 (3-4.3) -Magnesium 1.6 (1.6-2.3)  -Ferritin 29 (15-150), hemoglobin 11.1 (11.1-15.9) Corrected calcium: 8.62, slightly lower than normal, which is our target. I was surprised by her very low glucose (possibly lab artifact as she did not have any symptoms at the time of the blood draw).  She does not have a history of diabetes but has steroid-induced hyperglycemia...   She is on the following regimen: -  >> she stopped taking it in 08/2018 (!!) - high-dose vitamin D 50,000 units weekly - calcitriol 0.5 mcg 2x a day  We also started HCTZ 25 mg daily.  In the meantime, we reduced her Lasix to 50% of her previous dose.  She is currently on HCTZ per nephrology.  She denies any hand cramping or perioral numbness.  History of vitamin D deficiency.    Platelet levels reviewed: 05/08/2018: Vitamin D 39.8 Lab Results  Component Value Date   VD25OH 45.84 11/27/2017   VD25OH 43.9 07/14/2017   Calcitriol level was not elevated: Component     Latest Ref Rng & Units 11/27/2017  Vitamin D 1, 25 (OH) Total     18 - 72 pg/mL 54  Vitamin D3 1, 25 (OH)     pg/mL 30  Vitamin D2 1, 25 (OH)     pg/mL 24   Osteoporosis:  Reviewed previous DXA scan report: DXA  - [facility] (12/10/2017) Lumbar spine L1-L4 (L2) Femoral neck (FN) 33% distal left radius Ultra distal left radius  T-score -1.6 RFN: N/a LFN: -2.8   -2.3  -3.5   Assessment: Patient has OSTEOPOROSIS according to the [facility] classification for osteoporosis (see below). Fracture risk: high Comments: the technical quality of the study is good, however, the right femoral neck could not be analyzed as the patient could not internally rotate the right hip.  The left radius was analyzed instead.  Also, L2 vertebra had to be excluded from analysis due to degenerative changes.  We started Prolia, first dose 01/03/2018.  Last dose 07/16/2018.  No thigh, hip, jaw pain.  She has CKD. Last BUN/Cr: Lab Results  Component Value Date   BUN 17 01/03/2019   CREATININE 1.14 (H) 01/03/2019   She surgery for torn rotator cuffs in both shoulders. She has atrial  fibrillation and is status post cardioversion. She was on amiodarone before, now off.  ROS: Constitutional: + weight gain/+ weight loss, no fatigue, no subjective hyperthermia, no subjective hypothermia, + decreased appetite Eyes: no blurry vision, no xerophthalmia ENT: no sore throat, + see HPI Cardiovascular: no CP/no SOB/no palpitations/no leg swelling Respiratory: no cough/no SOB/no wheezing Gastrointestinal: no N/no V/no D/+ C/no acid reflux, + AP Musculoskeletal: no muscle aches/no joint aches Skin: no rashes, no hair loss Neurological: no tremors/no numbness/no tingling/no dizziness  I reviewed pt's medications, allergies, PMH, social hx, family hx, and changes were documented in the history of present illness. Otherwise, unchanged from my initial visit note.  Past Medical History:  Diagnosis Date  . Anemia   . Asthma    flare up last yr  . CHF (congestive heart failure) ([place])   . Chronic headache    " better now "   . DOE (dyspnea on exertion)    2D ECHO, 02/12/2012 - EF 60-65%, moderate concentric hypertrophy  . Fibromyalgia    nerve pain"left side at waist level" "can't lay on that side without pain" , "HOB elevation helps"; 9-17- since i lost the wieght , i dont  think i have  this anymore   . Heart murmur   . Hematuria - cause not known    resolved   . History of kidney stones    x 2 '13, '14 surgery to remove  . Hypertension   . SBO (small bowel obstruction) ([place]) 06/07/2013  . SBO (small bowel obstruction) ([place])    rqueired admission 2020  . Sleep apnea    tested several times and came up negative  . Thyroid disease    "goiter"  . Transfusion history    10 yrs+   Past Surgical History:  Procedure Laterality Date  . ABDOMINAL ADHESION SURGERY  2004   open LOA - in [place]  . CARDIAC CATHETERIZATION  04/04/2010   No significant obstructive coronary artery disease  . CARDIOVERSION N/A 08/08/2017   Procedure: CARDIOVERSION;  Surgeon: Pixie Casino, MD;  Location: [place];  Service: Cardiovascular;  Laterality: N/A;  . CHOLECYSTECTOMY  1990  . COLONOSCOPY W/ POLYPECTOMY    . COLONOSCOPY WITH PROPOFOL N/A 04/10/2014   Procedure: COLONOSCOPY WITH PROPOFOL;  Surgeon: Beryle Beams, MD;  Location: [facility];  Service: Endoscopy;  Laterality: N/A;  . EYE SURGERY     lasik 20-25 yrs ago  . GASTROPLASTY VERTICAL BANDED  1985  . [place]   In [place] for SBO  . REVERSE SHOULDER ARTHROPLASTY Right 03/29/2018   Procedure: REVERSE SHOULDER ARTHROPLASTY;  Surgeon: Netta Cedars, MD;  Location: [facility];  Service: Orthopedics;  Laterality: Right;  . ROUX-EN-Y GASTRIC BYPASS  1992   Conversion VBG to RnYGB in [place], [place]  . SHOULDER ARTHROSCOPY WITH SUBACROMIAL DECOMPRESSION  2016   Dr Berenice Primas, [place], [place]  . SMALL INTESTINE SURGERY  2000   SBO - LOA w SB resection  . TOTAL KNEE ARTHROPLASTY Left 11/14/2018   Procedure: TOTAL KNEE ARTHROPLASTY;  Surgeon: Paralee Cancel, MD;  Location: [facility];  Service: Orthopedics;  Laterality: Left;  70 mins  . TOTAL THYROIDECTOMY  06/26/2012   Dr Celine Ahr, [facility]  . TUBAL LIGATION  1986  . URETHRAL DILATION  2012   w cystoscopy.  Dr Gaynelle Arabian   Social History    Socioeconomic History  . Marital status: Married    Spouse name: Not on file  . Number of  children: 3  . Years of education: Not on file  . Highest education level: Master's degree (e.g., MA, MS, MEng, MEd, MSW, MBA)  Occupational History  . Occupation: Self employed Armed forces operational officer  Social Needs  . Financial resource strain: Not on file  . Food insecurity    Worry: Not on file    Inability: Not on file  . Transportation needs    Medical: Not on file    Non-medical: Not on file  Tobacco Use  . Smoking status: Never Smoker  . Smokeless tobacco: Never Used  Substance and Sexual Activity  . Alcohol use: No    Comment: wine occ  . Drug use: No    Types: Oxycodone    Comment: perscribed  . Sexual activity: Not Currently    Birth control/protection: None  Lifestyle  . Physical activity    Days per week: Not on file    Minutes per session: Not on file  . Stress: Not on file  Relationships  . Social Herbalist on phone: Not on file    Gets together: Not on file    Attends religious service: Not on file    Active member of club or organization: Not on file    Attends meetings of clubs or organizations: Not on file    Relationship status: Not on file  . Intimate partner violence    Fear of current or ex partner: Not on file    Emotionally abused: Not on file    Physically abused: Not on file    Forced sexual activity: Not on file  Other Topics Concern  . Not on file  Social History Narrative   Right handed   1 cup of caffeine per day        Current Outpatient Medications on File Prior to Visit  Medication Sig Dispense Refill  . acetaminophen (TYLENOL) 650 MG CR tablet Take 650 mg by mouth every 8 (eight) hours as needed for pain.    Marland Kitchen apixaban (ELIQUIS) 5 MG TABS tablet Take 1 tablet (5 mg total) by mouth 2 (two) times daily. 60 tablet 0  . calcitRIOL (ROCALTROL) 0.5 MCG capsule TAKE 1 CAPSULE BY MOUTH TWICE A DAY (Patient taking differently: Take 0.5 mcg by  mouth 2 (two) times daily. ) 180 capsule 0  . calcium carbonate (OS-CAL - DOSED IN MG OF ELEMENTAL CALCIUM) 1250 (500 Ca) MG tablet Take 2 tablets (1,000 mg of elemental calcium total) by mouth 2 (two) times daily. (Patient taking differently: Take 2 tablets by mouth 2 (two) times daily with a meal. ) 120 tablet 0  . clotrimazole (LOTRIMIN) 1 % cream APPLY TO AFFECTED AREA TWICE A DAY (Patient taking differently: Apply 1 application topically 2 (two) times daily. ) 30 g 0  . cyanocobalamin (,VITAMIN B-12,) 1000 MCG/ML injection Inject 1 mL (1,000 mcg total) into the muscle every 30 (thirty) days. 1 mL 0  . cyclobenzaprine (FLEXERIL) 10 MG tablet Take 2 tablets (20 mg total) by mouth 2 (two) times daily. 80 tablet 0  . DULoxetine (CYMBALTA) 60 MG capsule Take 1 capsule (60 mg total) by mouth daily. 30 capsule 0  . eszopiclone (LUNESTA) 2 MG TABS tablet Take 1 tablet (2 mg total) by mouth at bedtime. 20 tablet 0  . fluorometholone (FML) 0.1 % ophthalmic suspension Place 1 drop into both eyes 4 (four) times daily.    . furosemide (LASIX) 20 MG tablet Take 1 tablet (20 mg total) by mouth daily  as needed (swelling). (Patient taking differently: Take 40 mg by mouth daily as needed (swelling). ) 30 tablet 11  . hyoscyamine (LEVSIN SL) 0.125 MG SL tablet Place 1 tablet (0.125 mg total) under the tongue every 4 (four) hours as needed for cramping (abdominal pain). 30 tablet 0  . levETIRAcetam (KEPPRA) 500 MG tablet Take 1 tablet (500 mg total) by mouth 2 (two) times daily. 180 tablet 1  . levothyroxine (SYNTHROID) 200 MCG tablet TAKE 1 TABLET BY MOUTH EVERY DAY BEFORE BREAKFAST (Patient taking differently: Take 200 mcg by mouth daily before breakfast. ) 90 tablet 3  . magnesium oxide (MAG-OX) 400 MG tablet Take 1 tablet (400 mg total) by mouth daily. 30 tablet 0  . metoprolol succinate (TOPROL-XL) 25 MG 24 hr tablet TAKE 1 TABLET BY MOUTH IN AM AND TAKE YOUR 50 MG TABLETS IN THE EVENING TAKE WITH FOOD (Patient  taking differently: Take 25 mg by mouth 2 (two) times daily. ) 90 tablet 3  . mirabegron ER (MYRBETRIQ) 50 MG TB24 tablet Take 1 tablet (50 mg total) by mouth daily. 30 tablet 0  . NONFORMULARY OR COMPOUNDED ITEM [facility]:  Antifungal Cream - Terbinafine 3%, Fluconazole 2%, Tea Tree Oil 5%, Urea 10%, Ibuprofen 2% in DMSO Suspension #88m. Apply to affected toenail(s) once daily (at bedtime) or twice daily. 30 each 11  . ondansetron (ZOFRAN) 4 MG tablet Take 1 tablet (4 mg total) by mouth every 6 (six) hours as needed for nausea or vomiting. 20 tablet 0  . polyethylene glycol (MIRALAX / GLYCOLAX) 17 g packet Take 17 g by mouth daily as needed for moderate constipation. 14 each 0  . senna (SENOKOT) 8.6 MG tablet Take 2 tablets by mouth daily.    . traZODone (DESYREL) 50 MG tablet Take 1 tablet (50 mg total) by mouth at bedtime. 30 tablet 0  . Vitamin D, Ergocalciferol, (DRISDOL) 1.25 MG (50000 UT) CAPS capsule Take 1 capsule (50,000 Units total) by mouth every Wednesday. 4 capsule 0   No current facility-administered medications on file prior to visit.    Allergies  Allergen Reactions  . Hydrocodone Itching    Can tolerate with benadryl     Family History  Problem Relation Age of Onset  . Diabetes Mother   . Epilepsy Mother   . Cancer Mother        Breast  . Hypertension Mother   . Breast cancer Mother   . Seizures Mother   . Kidney disease Father   . Diabetes Father   . Hypertension Father   . Asthma Father   . Heart disease Father   . Epilepsy Sister   . Cancer Maternal Grandmother   . Breast cancer Maternal Grandmother   . Cancer Paternal Grandmother   . Breast cancer Paternal Grandmother     PE: BP 130/70   Pulse 85   Ht 5' 6"  (1.676 m)   Wt 222 lb (100.7 kg)   SpO2 96%   BMI 35.83 kg/m  Wt Readings from Last 3 Encounters:  01/06/19 222 lb (100.7 kg)  12/23/18 228 lb (103.4 kg)  12/09/18 219 lb 5.7 oz (99.5 kg)   Constitutional: Obese, in NAD Eyes:  PERRLA, EOMI, no exophthalmos ENT: moist mucous membranes, no thyromegaly, no cervical lymphadenopathy Cardiovascular: Irregularly irregular rhythm, RR, No MRG, bilateral lymphedema Respiratory: CTA B Gastrointestinal: abdomen soft, NT, ND, BS+ Musculoskeletal: no deformities, strength intact in all 4 Skin: moist, warm, no rashes Neurological: no tremor with outstretched hands, DTR  normal in all 4, [place] sign negative bilaterally  Assessment: 1. Papillary thyroid cancer  2. Postsurgical hypothyroidism  3.  Postsurgical  Hypocalcemia  4.  Osteoporosis  PLAN: 1. PTC  Patient had an incidentally found focus of micropapillary thyroid cancer, which was discovered after her thyroidectomy for enlarging nontoxic multinodular goiter.  Since the cancer was so small, this was most likely cured by her thyroidectomy. -No imaging follow-up is necessary, we will continue to follow her clinically -No neck compression symptoms.  She has chronic dysphagia  2. Patient with hypothyroidism developed after thyroidectomy - latest thyroid labs reviewed with pt >> normal: Lab Results  Component Value Date   TSH 1.21 11/27/2017   - she continues on LT4 200 mcg daily - pt feels good on this dose. - we discussed about taking the thyroid hormone every day, with water, >30 minutes before breakfast, separated by >4 hours from acid reflux medications, calcium, iron, multivitamins. Pt. is taking it correctly. - will check thyroid tests today: TSH and fT4 - If labs are abnormal, she will need to return for repeat TFTs in 1.5 months  3. Patient with postoperative, iatrogenic hypocalcemia -In 2019, she had a very low blood calcium of 5.5.  Vitamin D level was normal.  She does have a history of magnesium deficiency and high phosphorus.  At that time, we started calcitriol 0.5 mcg twice a day.  Her calcium level improved and her magnesium and phosphorus normalized.  We discussed that a calcium at the lower limit  of normal or even slightly lower than this is on target. -She continues ergocalciferol 50,000 units weekly.  Latest vitamin D level was normal in 04/2018.  We will repeat this today. -Her latest calcium level was reviewed and this was normal, at 9.9, 3 days ago.  However, she is telling me that she stopped her calcium supplement approximately 4 months ago.  I am worried that her ionized calcium might be low.  We will check this today. -At this visit, no signs or symptoms of hypocalcemia: No perioral numbness, no leg cramping -We will continue her vitamin D, and calcitriol supplementation for now -Currently, Marcina Millard is out of the market after being recalled for contaminants -I will see her back in 6 months  4. Osteoporosis -Reviewed latest DXA scan report with the patient.  This showed osteoporosis.  - We started Prolia 01/03/2018 she had a second injection 07/16/2018.  She is tolerating this well.  Calcium did not decrease significantly afterwards. -She is due for another injection at the end of the month -Plan is to continue for at least 6 years, but we can go to 10 years. -She is due for another bone density scan in a year.  Stable or increasing wellness T-scores are desirable, but ultimately, the best indication that the treatment is working is her not having fractures.  Patient Instructions  Please continue: - Vitamin D 50,000 U weekly - Calcitriol 0.5 mcg 2x a day  Also, continue levothyroxine 200 mcg daily.  Take the thyroid hormone every day, with water, at least 30 minutes before breakfast, separated by at least 4 hours from: - acid reflux medications - calcium - iron - multivitamins  Please come back for a follow-up appointment in 6 months.  Component     Latest Ref Rng & Units 01/06/2019  TSH     0.35 - 4.50 uIU/mL 0.05 (L)  T4,Free(Direct)     0.60 - 1.60 ng/dL 1.47  Calcium Ionized  4.8 - 5.6 mg/dL 4.90  VITD     30.00 - 100.00 ng/mL 63.90  TSH suppressed, as  expected after her weight loss.  We will decrease the dose of levothyroxine to 175 mcg daily and repeat her tests in 1.5 months. Her ionized calcium is excellent.  I will advise her to try to decrease the calcitriol dose to 0.25 mcg 2x a day and recheck her calcium in 2-3 weeks after this change.  Philemon Kingdom, MD PhD [facility]

## 2019-01-06 NOTE — Telephone Encounter (Signed)
Patient has been verified-she owes $0 out-of-pocket and can be scheduled after 01/17/19

## 2019-01-06 NOTE — Patient Instructions (Addendum)
Please continue: - Vitamin D 50,000 U weekly - Calcitriol 0.5 mcg 2x a day  Also, continue levothyroxine 200 mcg daily.  Take the thyroid hormone every day, with water, at least 30 minutes before breakfast, separated by at least 4 hours from: - acid reflux medications - calcium - iron - multivitamins  Please stop at the lab.  Please come back for a follow-up appointment in 6 months.

## 2019-01-07 ENCOUNTER — Encounter: Payer: Self-pay | Admitting: Internal Medicine

## 2019-01-07 LAB — CALCIUM, IONIZED: Calcium, Ion: 4.9 mg/dL (ref 4.8–5.6)

## 2019-01-07 LAB — VITAMIN D 25 HYDROXY (VIT D DEFICIENCY, FRACTURES): VITD: 63.9 ng/mL (ref 30.00–100.00)

## 2019-01-07 LAB — T4, FREE: Free T4: 1.47 ng/dL (ref 0.60–1.60)

## 2019-01-07 LAB — TSH: TSH: 0.05 u[IU]/mL — ABNORMAL LOW (ref 0.35–4.50)

## 2019-01-07 MED ORDER — LEVOTHYROXINE SODIUM 175 MCG PO TABS: 175.0000 ug | ORAL_TABLET | Freq: Every day | ORAL | 3 refills | Status: DC

## 2019-01-07 MED ORDER — CALCITRIOL 0.25 MCG PO CAPS: 0.2500 ug | ORAL_CAPSULE | Freq: Two times a day (BID) | ORAL | 3 refills | Status: DC

## 2019-01-09 ENCOUNTER — Encounter: Payer: Self-pay | Admitting: Pulmonary Disease

## 2019-01-09 ENCOUNTER — Other Ambulatory Visit: Payer: Self-pay

## 2019-01-09 ENCOUNTER — Encounter: Payer: Self-pay | Admitting: Internal Medicine

## 2019-01-09 ENCOUNTER — Ambulatory Visit (INDEPENDENT_AMBULATORY_CARE_PROVIDER_SITE_OTHER): Payer: Medicare Other | Admitting: Pulmonary Disease

## 2019-01-09 VITALS — BP 130/86 | HR 80 | Temp 97.3°F | Ht 66.34 in | Wt 223.0 lb

## 2019-01-09 DIAGNOSIS — I639 Cerebral infarction, unspecified: Secondary | ICD-10-CM

## 2019-01-09 DIAGNOSIS — R0600 Dyspnea, unspecified: Secondary | ICD-10-CM | POA: Diagnosis not present

## 2019-01-09 DIAGNOSIS — R0609 Other forms of dyspnea: Secondary | ICD-10-CM

## 2019-01-09 DIAGNOSIS — J4540 Moderate persistent asthma, uncomplicated: Secondary | ICD-10-CM

## 2019-01-09 DIAGNOSIS — H43812 Vitreous degeneration, left eye: Secondary | ICD-10-CM | POA: Diagnosis not present

## 2019-01-09 DIAGNOSIS — H04123 Dry eye syndrome of bilateral lacrimal glands: Secondary | ICD-10-CM | POA: Diagnosis not present

## 2019-01-09 MED ORDER — ARNUITY ELLIPTA 100 MCG/ACT IN AEPB: 1.0000 | INHALATION_SPRAY | Freq: Every day | RESPIRATORY_TRACT | 5 refills | Status: DC

## 2019-01-09 NOTE — Patient Instructions (Signed)
Arnuity one puff daily, and rinse mouth after each use  Will schedule pulmonary function test and overnight oximetry  Follow up in 4 weeks

## 2019-01-09 NOTE — Progress Notes (Signed)
[facility]  Chief Complaint  Patient presents with  . Consult    pulmonary hypertension, increased shortness of breath, producitve cough    Constitutional:  BP 130/86 (BP Location: Right Arm, Cuff Size: Large)   Pulse 80   Temp (!) 97.3 F (36.3 C) (Temporal)   Ht 5' 6.34" (1.685 m)   Wt 223 lb (101.2 kg)   SpO2 98%   BMI 35.63 kg/m   Past Medical History:  OSA, Respiratory failure, Anemia, OA, Asthma, CKD, Depression, Fibromyalgia, GERD, HTN, Hypothyroidism, Nephrolithiasis, Migraine HA, Osteopenia, Unilateral vocal cord paralysis 2017, PAF, Combined CHF, Seizure 2018, CKD 3  Brief Summary:  Jillian Eaton is a 68 y.o. female   She was previously seen by Dr. Lake Bells and Dr. Camillo Flaming for pulmonary.  She has history of obstructive sleep apnea and chronic respiratory failure with hypoxia and hypercapnia.  She had sleep study at [facility] 08/27/17 (results not available at this time).  She said this was negative.  I saw her during hospitalization in 2018.  At that time she weighed 318 lbs.  She has lost over 100 lbs.  She no longer is using supplemental oxygen.  She had left knee surgery recently.  This helped a lot with her knee pain and walking.  She is scheduled to have Rt knee surgery at end of December.  Her activity has been limited due to dyspnea.  She feels tight and uncomfortable in her chest.  She notices problems with her breathing when in cold weather or hot air.  She has been getting sinus congestion and post nasal drip.  Not using inhalers at present.    Physical Exam:   Appearance - well kempt   ENMT - clear nasal mucosa, midline nasal  septum, no oral exudates, no LAN, trachea midline  Respiratory - normal chest wall, normal respiratory effort, no accessory muscle use, no wheeze/rales  CV - s1s2 regular rate and rhythm, no murmurs, no peripheral edema, radial pulses symmetric  GI - soft, non tender, no masses  Lymph  - no adenopathy noted in neck and axillary areas  MSK - using a walker  Ext - no cyanosis, clubbing, or joint inflammation noted  Skin - no rashes, lesions, or ulcers  Neuro - normal strength, oriented x 3  Psych - normal mood and affect   CMP Latest Ref Rng & Units 01/03/2019 12/09/2018 12/08/2018  Glucose 70 - 99 mg/dL 93 94 86  BUN 8 - 23 mg/dL 17 23 21   Creatinine 0.44 - 1.00 mg/dL 1.14(H) 1.26(H) 1.18(H)  Sodium 135 - 145 mmol/L 140 142 141  Potassium 3.5 - 5.1 mmol/L 4.1 3.7 3.3(L)  Chloride 98 - 111 mmol/L 101 98 98  CO2 22 - 32 mmol/L 21(L) 32 30  Calcium 8.9 - 10.3 mg/dL 9.9 8.4(L) 8.3(L)  Total Protein 6.5 - 8.1 g/dL 7.3 - -  Total Bilirubin 0.3 - 1.2 mg/dL 0.8 - -  Alkaline Phos 38 - 126 U/L 67 - -  AST 15 - 41 U/L 18 - -  ALT 0 - 44 U/L 13 - -    CBC Latest Ref Rng & Units 01/03/2019 12/09/2018 12/08/2018  WBC 4.0 - 10.5 K/uL 7.2 5.4 4.8  Hemoglobin 12.0 - 15.0 g/dL 10.9(L) 9.1(L) 8.8(L)  Hematocrit 36.0 - 46.0 % 35.5(L) 29.0(L) 28.3(L)  Platelets 150 - 400 K/uL 205 303 317    ABG    Component Value Date/Time   PHART 7.248 (L) 07/11/2017 1455  PCO2ART 81.9 (HH) 07/11/2017 1455   PO2ART 90.0 07/11/2017 1455   HCO3 27.8 05/26/2018 1710   TCO2 29 05/26/2018 1710   ACIDBASEDEF 2.8 (H) 07/08/2016 0433   O2SAT 97.0 05/26/2018 1710     Discussion:  She has prior history of sleep apnea and respiratory failure with hypoxia and hypercapnia.  These improved after she lost weight.    She had knee surgery recently and developed dyspnea after this.  She has previous diagnosis of asthma, and recent imaging studies are suggestive of small airway disease that could be seen with asthma.  She also has allergic/irritant induced rhinitis.    Her recent CT chest was suggestive of pulmonary hypertension, but Echo did not show this.  Assessment/Plan:   Dyspnea on exertion with atelectasis and mosaic attention on CT imaging. - most likely from asthma - add arnuity -  repeat PFT  History of sleep disordered breathing. - improved after weight loss - check overnight oximetry with room air and then determine whether she needs repeat sleep testing  Atrial fibrillation, chronic combined CHF, HTN. - f/u with cardiology - recent Echo doesn't show evidence for pulmonary hypertension; if her dyspnea persists after optimizing therapy for asthma, then she might need right heart catheterization  Right knee pain. - she is schedule for TKR at end of December - will reassess her pulmonary status in mid December    Patient Instructions  Arnuity one puff daily, and rinse mouth after each use  Will schedule pulmonary function test and overnight oximetry  Follow up in 4 weeks    Chesley Mires, MD [place] Pager: [phone] 01/09/2019, 10:57 AM  Flow Sheet     Pulmonary tests:  PFT 10/19/15 >> FEV1 1.38 (60%), FEV1% 82, TLC 3.34 (60%), DLCO 74%, +BD ABG 07/08/16 >> pH 7.38, PCO2 51.9, PO2 70  Chest imaging:  CT angio chest 12/06/18 >> enlarged PA, CM, mosaic attenuation with GGO RUL  Sleep tests:    Cardiac tests:  Echo 12/08/18 >> EF 55 to 60%, severe LA/RA dilation   Review of Systems:  Dyspnea, Cough, Palpitations, Indigestion, Change in appetite, Headache, Itching, Rash.  Remainder reviewed and negative  Medications:   Allergies as of 01/09/2019      Reactions   Hydrocodone Itching   Can tolerate with benadryl       Medication List       Accurate as of January 09, 2019 10:57 AM. If you have any questions, ask your nurse or doctor.        acetaminophen 650 MG CR tablet Commonly known as: TYLENOL Take 650 mg by mouth every 8 (eight) hours as needed for pain.   apixaban 5 MG Tabs tablet Commonly known as: ELIQUIS Take 1 tablet (5 mg total) by mouth 2 (two) times daily.   Arnuity Ellipta 100 MCG/ACT Aepb Generic drug: Fluticasone Furoate Inhale 1 puff into the lungs daily. Started by: Chesley Mires, MD    calcitRIOL 0.25 MCG capsule Commonly known as: ROCALTROL Take 1 capsule (0.25 mcg total) by mouth 2 (two) times daily.   clotrimazole 1 % cream Commonly known as: LOTRIMIN APPLY TO AFFECTED AREA TWICE A DAY What changed: See the new instructions.   cyanocobalamin 1000 MCG/ML injection Commonly known as: (VITAMIN B-12) Inject 1 mL (1,000 mcg total) into the muscle every 30 (thirty) days.   cyclobenzaprine 10 MG tablet Commonly known as: FLEXERIL Take 2 tablets (20 mg total) by mouth 2 (two) times daily.   DULoxetine 60 MG  capsule Commonly known as: CYMBALTA Take 1 capsule (60 mg total) by mouth daily.   eszopiclone 2 MG Tabs tablet Commonly known as: LUNESTA Take 1 tablet (2 mg total) by mouth at bedtime.   fluorometholone 0.1 % ophthalmic suspension Commonly known as: FML Place 1 drop into both eyes 4 (four) times daily.   furosemide 20 MG tablet Commonly known as: LASIX Take 1 tablet (20 mg total) by mouth daily as needed (swelling). What changed: how much to take   hyoscyamine 0.125 MG SL tablet Commonly known as: LEVSIN SL Place 1 tablet (0.125 mg total) under the tongue every 4 (four) hours as needed for cramping (abdominal pain).   Keflex 500 MG capsule Generic drug: cephALEXin Keflex 500 mg capsule  Take 1 capsule every 6 hours by oral route for 21 days.   levETIRAcetam 500 MG tablet Commonly known as: Keppra Take 1 tablet (500 mg total) by mouth 2 (two) times daily.   levothyroxine 175 MCG tablet Commonly known as: SYNTHROID Take 1 tablet (175 mcg total) by mouth daily before breakfast.   magnesium oxide 400 MG tablet Commonly known as: MAG-OX Take 1 tablet (400 mg total) by mouth daily.   metoprolol succinate 25 MG 24 hr tablet Commonly known as: TOPROL-XL TAKE 1 TABLET BY MOUTH IN AM AND TAKE YOUR 50 MG TABLETS IN THE EVENING TAKE WITH FOOD What changed:   how much to take  how to take this  when to take this  additional instructions    mirabegron ER 50 MG Tb24 tablet Commonly known as: Myrbetriq Take 1 tablet (50 mg total) by mouth daily.   NONFORMULARY OR COMPOUNDED ITEM [facility]:  Antifungal Cream - Terbinafine 3%, Fluconazole 2%, Tea Tree Oil 5%, Urea 10%, Ibuprofen 2% in DMSO Suspension #9ml. Apply to affected toenail(s) once daily (at bedtime) or twice daily.   ondansetron 4 MG tablet Commonly known as: ZOFRAN Take 1 tablet (4 mg total) by mouth every 6 (six) hours as needed for nausea or vomiting.   polyethylene glycol 17 g packet Commonly known as: MIRALAX / GLYCOLAX Take 17 g by mouth daily as needed for moderate constipation.   senna 8.6 MG tablet Commonly known as: SENOKOT Take 2 tablets by mouth daily.   traZODone 50 MG tablet Commonly known as: DESYREL Take 1 tablet (50 mg total) by mouth at bedtime.   Vitamin D (Ergocalciferol) 1.25 MG (50000 UT) Caps capsule Commonly known as: DRISDOL Take 1 capsule (50,000 Units total) by mouth every Wednesday.       Past Surgical History:  She  has a past surgical history that includes Cholecystectomy (1990); Tubal ligation (1986); Cardiac catheterization (04/04/2010); Colonoscopy w/ polypectomy; Colonoscopy with propofol (N/A, 04/10/2014); Cardioversion (N/A, 08/08/2017); Eye surgery; Reverse shoulder arthroplasty (Right, 03/29/2018); Roux-en-Y Gastric Bypass (1992); Gastroplasty vertical banded (1985); Laparoscopic lysis intestinal adhesions (1995); Total thyroidectomy (06/26/2012); Urethral dilation (2012); Shoulder arthroscopy with subacromial decompression (2016); Small intestine surgery (2000); Abdominal adhesion surgery (2004); and Total knee arthroplasty (Left, 11/14/2018).  Family History:  Her family history includes Asthma in her father; Breast cancer in her maternal grandmother, mother, and paternal grandmother; Cancer in her maternal grandmother, mother, and paternal grandmother; Diabetes in her father and mother; Epilepsy in her mother and  sister; Heart disease in her father; Hypertension in her father and mother; Kidney disease in her father; Seizures in her mother.  Social History:  She  reports that she has never smoked. She has never used smokeless tobacco. She reports that  she does not drink alcohol or use drugs.

## 2019-01-10 ENCOUNTER — Ambulatory Visit (INDEPENDENT_AMBULATORY_CARE_PROVIDER_SITE_OTHER): Payer: Medicare Other | Admitting: Podiatry

## 2019-01-10 ENCOUNTER — Encounter: Payer: Self-pay | Admitting: Podiatry

## 2019-01-10 DIAGNOSIS — B351 Tinea unguium: Secondary | ICD-10-CM

## 2019-01-10 DIAGNOSIS — Z9229 Personal history of other drug therapy: Secondary | ICD-10-CM | POA: Diagnosis not present

## 2019-01-10 DIAGNOSIS — M79675 Pain in left toe(s): Secondary | ICD-10-CM | POA: Diagnosis not present

## 2019-01-10 DIAGNOSIS — L84 Corns and callosities: Secondary | ICD-10-CM | POA: Diagnosis not present

## 2019-01-10 DIAGNOSIS — B353 Tinea pedis: Secondary | ICD-10-CM

## 2019-01-10 DIAGNOSIS — M25662 Stiffness of left knee, not elsewhere classified: Secondary | ICD-10-CM | POA: Diagnosis not present

## 2019-01-10 DIAGNOSIS — M79674 Pain in right toe(s): Secondary | ICD-10-CM | POA: Diagnosis not present

## 2019-01-10 DIAGNOSIS — M25562 Pain in left knee: Secondary | ICD-10-CM | POA: Diagnosis not present

## 2019-01-10 MED ORDER — CLOTRIMAZOLE 1 % EX CREA: TOPICAL_CREAM | CUTANEOUS | 0 refills | Status: DC

## 2019-01-10 NOTE — Patient Instructions (Signed)

## 2019-01-13 ENCOUNTER — Other Ambulatory Visit: Payer: Self-pay | Admitting: Internal Medicine

## 2019-01-13 ENCOUNTER — Encounter: Payer: Self-pay | Admitting: Internal Medicine

## 2019-01-13 MED ORDER — LEVOTHYROXINE SODIUM 150 MCG PO TABS: 150.0000 ug | ORAL_TABLET | Freq: Every day | ORAL | 3 refills | Status: DC

## 2019-01-18 NOTE — Progress Notes (Signed)
Subjective: Jillian Eaton is a 68 y.o. y.o. female who is on long term blood thinner, Eliquis, and presents today with painful, discolored, thick toenails which interfere with daily activities. Pain is aggravated when wearing enclosed shoe gear. Pain is relieved with periodic professional debridement.  Pt also presents with painful corn formation noted b/l 5th digits.  Medications reviewed in chart.  Allergies  Allergen Reactions  . Hydrocodone Itching    Can tolerate with benadryl      Objective: There were no vitals filed for this visit.  Vascular Examination: Capillary refill time immediate b/l.  Dorsalis pedis pulses and posterior tibial pulses palpable b/l.  No digital hair x 10 digits.  Skin temperature gradient WNL b/l.  Dermatological Examination: Skin with normal turgor and tone b/l.  No open wounds b/l.  Toenails 1-5 b/l discolored, thick, dystrophic with subungual debris and pain with palpation to nailbeds due to thickness of nails.  Diffuse scaling noted peripherally and plantarly b/l feet with mild foot odor.  No interdigital macerations.  No blisters, no weeping. No signs of secondary bacterial infection noted.  Hyperkeratotic lesions dorsal 5th digits b/l. No erythema, no edema, no drainage, no flocculence noted.   Musculoskeletal: Muscle strength 5/5 to all LE muscle groups  Neurological: Sensation intact 5/5 b/l with 10 gram monofilament.  Vibratory sensation intact b/l.  Assessment: Painful onychomycosis toenails 1-5 b/l in patient on blood thinner Corn b/l 5th digits Tinea pedis b/l   Plan: 1. Toenails 1-5 b/l were debrided in length and girth without iatrogenic bleeding. 2. Hyperkeratotic lesions b/l 5th digits debrided utilizing sterile scalpel blade without incident.  3. Rx for Clotrimazole Cream 1% to be applied to both feet and between toes bid x 4 weeks 4. Patient to continue soft, supportive shoe gear daily. 5. Patient to  report any pedal injuries to medical professional immediately. 6. Avoid self trimming due to use of blood thinner. 7. Follow up 3 months. 8. Patient/POA to call should there be a concern in the interim.

## 2019-01-21 ENCOUNTER — Other Ambulatory Visit: Payer: Self-pay

## 2019-01-21 ENCOUNTER — Ambulatory Visit: Payer: Medicare Other | Admitting: Internal Medicine

## 2019-01-21 DIAGNOSIS — M810 Age-related osteoporosis without current pathological fracture: Secondary | ICD-10-CM

## 2019-01-21 NOTE — Progress Notes (Unsigned)
Per orders of Dr. Cruzita Lederer injection of Prolia 60mg  given today by L.Spagnol,RN. Patient tolerated injection well.

## 2019-01-22 DIAGNOSIS — M25662 Stiffness of left knee, not elsewhere classified: Secondary | ICD-10-CM | POA: Diagnosis not present

## 2019-01-22 DIAGNOSIS — M25562 Pain in left knee: Secondary | ICD-10-CM | POA: Diagnosis not present

## 2019-01-24 ENCOUNTER — Other Ambulatory Visit: Payer: Self-pay

## 2019-01-24 ENCOUNTER — Ambulatory Visit (INDEPENDENT_AMBULATORY_CARE_PROVIDER_SITE_OTHER): Payer: Medicare Other | Admitting: Internal Medicine

## 2019-01-24 VITALS — BP 102/86 | HR 67 | Ht 66.0 in | Wt 229.0 lb

## 2019-01-24 DIAGNOSIS — I639 Cerebral infarction, unspecified: Secondary | ICD-10-CM

## 2019-01-24 DIAGNOSIS — I5033 Acute on chronic diastolic (congestive) heart failure: Secondary | ICD-10-CM

## 2019-01-24 DIAGNOSIS — I4811 Longstanding persistent atrial fibrillation: Secondary | ICD-10-CM

## 2019-01-24 MED ORDER — FUROSEMIDE 20 MG PO TABS: 20.0000 mg | ORAL_TABLET | ORAL | 2 refills | Status: DC | PRN

## 2019-01-24 NOTE — Patient Instructions (Signed)
Medication Instructions:  INCREASE- Lasix 40 mg by mouth daily for 1 week, then take 20 mg by mouth daily  *If you need a refill on your cardiac medications before your next appointment, please call your pharmacy*  Lab Work: None ordered  Testing/Procedures: None Ordered  Follow-Up: At [facility], you and your health needs are our priority.  As part of our continuing mission to provide you with exceptional heart care, we have created designated Provider Care Teams.  These Care Teams include your primary Cardiologist (physician) and Advanced Practice Providers (APPs -  Physician Assistants and Nurse Practitioners) who all work together to provide you with the care you need, when you need it.  Your next appointment:   2 month(s)  The format for your next appointment:   In Person  Provider:   K. Mali Hilty, MD

## 2019-01-24 NOTE — Progress Notes (Signed)
OFFICE NOTE  Chief Complaint:  Follow-up dyspnea  Primary Care Physician: [name], MD  HPI:  Jillian Eaton is a 68 year old female I saw a few weeks ago with a history of super morbid obesity, fibromyalgia and increasing shortness of breath. She had a cath in 2012 which showed normal coronaries, mildly elevating filling pressures, and diastolic pressures with a mean PA of 31. This correlated with her echocardiogram when RV systolic pressures in 0321 were 49 on echo. A repeat echocardiogram was just performed which demonstrated a preserved LVEF of 60-65% with moderate concentric LVH. There was mild to moderate increase in pulmonary artery pressure with peak at 51, which has not significantly changed from her study 1 year ago. Although the pressures look very similar her shortness of breath has been increasing significantly, and I recommended that she wear oxygen at night since she had that at home which does seem to be helping her at least feel better during the day as I suspect she has sleep apnea. She has been tested before which was apparently negative and is considering retesting at some point in the future. Overall I think the main issue obviously is weight, and she unfortunately says that she is not a candidate for gastric bypass and therefore it is a very difficult situation.  I referred her back to her pulmonologist in [facility] for ongoing evaluation of pulmonary hypertension. She tells me that she was then referred to [facility] and saw a specialist there who did another right heart catheterization but did not recommend any medications other than better blood pressure control.   In addition she had problems with kidney stones and underwent 2 operations regarding tthis.  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  She is now dependent on thyroid medication. Unfortunately she's not been able to lose weight, but her weight is fairly  stable around 400 pounds as is her shortness of breath.  Cath in 2012:  LEFT HEART CATHETERIZATION  OPERATOR: Mali Pahoua Schreiner, MD, and Rolland Porter, MD  INDICATION: Dyspnea on exertion and chest pressure.  HISTORY OF PRESENT ILLNESS: Jillian Eaton is a morbidly obese (BMI greater than 1) female with a history of failed gastric bypass x2 with an increasing weight gain and increasing shortness of breath as well as new- onset dyspnea on exertion. She reports that she can only walk about 10 feet before she becomes short of breath and has chest pressure which she says get better with rest. She has numerous cardiac risk factors and given the high likelihood of false positive stress test, I have referred her for cardiac catheterization, both left and right heart as she has had elevated RVSP on echocardiogram of approximately 30 to 31 mmHg.  PROCEDURE: The patient was brought into the cardiac catheterization lab, sterilely prepped and draped in the usual fashion. The area around the right femoral artery and right brachial vein were cleansed and draped to allow an attempt at radial arterial and brachiocephalic venous access. IV was not able to be obtained prior to the procedure given her body habitus. With difficulty in assessing the vein, the ultrasound was eventually used to identify the right brachiocephalic vein, however, cannulation with needle and wire was not possible as the vein was very small in caliber. After mild local bleeding was controlled, we did turn our attention to the right femoral vein and with great difficulty using the ultrasound, the right femoral vein was accessed by Dr.  Ellyn Hack using a straight wire and a needle. After venous access was obtained, the attention was turned to the right radial artery by Dr. Ellyn Hack and simultaneous to that right heart catheterization was performed by myself without any difficulty. The right radial artery was  successfully cannulated and subsequently left coronary artery system was selectively injected with a 5-French TIG 4.0 catheter, however the right coronary artery could not be cannulated with the TIG catheter and was eventually cannulated with a JR-4 catheter. LV pressure was measured with a pigtail catheter. Estimated blood loss was about 30 mL. There were no acute complications. The patient received a total of 9 mg of Versed throughout the procedure as well as 225 mcg of fentanyl and was at no point greater than moderately sedated. She received 5000 units of heparin about 10 mL of a radial cocktail.  FINDINGS: 1. Left main - short, no disease. 2. LAD - no significant disease. 3. Left circumflex, no significant disease. 4. RCA - dominant, no disease, large-caliber vessel. 5. LVEDP = 20 mmHg. 6. RA - 12. 7. RV 38/12. 8. PA - 43/19 (31). 9. PCWP - 24. 10.TPG - 7. 11.Fick cardiac output/Fick cardiac index - 10.56/3.84. 12.Thermodilution cardiac output/thermodilution cardiac index -  6.78/2.47. 13.Aortic saturation - 94%. 14.PA saturation - 68%.  IMPRESSION: 1. No significant obstructive coronary artery disease. 2. LVEDP = 20 mmHg. 3. Borderline pulmonary venous hypertension. 4. High cardiac output.  Jillian Eaton returns today for follow-up. She reports that her shortness of breath has not significantly worsened, in fact, possibly is slightly better. She did have thyroid surgery last year at [facility] and apparently underwent right heart catheterization prior to that. I do not have those records immediately available. Unfortunately she continues to maintain her weight and has not been able to lose any. She is complaining of some numbness and tingling in her feet which is likely neuropathy. She is on medication including Lyrica which she takes for fibromyalgia.  Jillian Eaton returns today for follow-up. She recently is been having more shortness of breath  and lower extremity swelling. She pointed out edema in her legs with very small blisters and some chronic venous stasis changes. She is not currently on a diuretic. She recently saw another new primary care provider with the [facility]. She was noted to be started on lisinopril 40 mg daily, which she is taking in addition to losartan 100 mg daily. The notes do not indicate from her office visit why she was started on lisinopril, but I can see through care everywhere that this was ordered by her primary care provider. She also takes amlodipine for blood pressure control. Her blood pressure was elevated initially at 178/94, but after resting came down to 120/78 and is at goal today. It is unusual, however to use both ACE inhibitors and ARBs.  07/02/2015  Jillian Eaton returns today for follow-up. She underwent a repeat echocardiogram for progressive dyspnea and exertion which showed a preserved LVEF of 6065% however there is moderate to severe pulmonary hypertension with an RVSP of 64 mmHg. This is increased about 10 mmHg compared to her prior study. Her mean pulmonary pressure by catheterization in 2012 was only 31 mmHg. I suspect that progressive pulmonary hypertension as a cause of her worsening shortness of breath. In fact, during recent hospitalization for surgery she required discharged with oxygen. She says she rarely uses oxygen at home but notes that she is hypoxic often when she checks her oxygen  levels. At her last office visit I recommended Lasix which she has been using sparingly. She has problems with incontinence and she does report an improvement in her swelling with it but does not notice significant change in her shortness of breath.  06/06/2016  Jillian Eaton returns from hospital follow-up. She was just admitted after an admission in January for unintentional narcotic overdose. She had unresponsiveness and possible aspiration. There was a second admission for a similar presentation  with respiratory failure, fever, sepsis and ultimately she was found to have a new onset cardiomyopathy with EF as low as 25%. Was felt that this was nonischemic. She was having intermittent atrial tachycardia and was felt that this might need to be managed with antiarrhythmic therapy however it seems to have resolved with carvedilol. She was placed on diuretics and reports that her breathing is close to baseline. She remains hypoxic with an O2 saturation 93% however was on home oxygen is result of her severe pulmonary hypertension. She does not feel that she needs to use the oxygen very regularly. From a heart failure standpoint she is on carvedilol, aspirin, furosemide and not currently on an ACE inhibitor, ARB or Entresto. Recent testing a renal function shows normal creatinine.  10/11/2016  Jillian Eaton is seen today in follow-up. In July she was hospitalized for hyperkalemia. She is on supplemental potassium and lisinopril, both were stopped. Fortunately her echo has improved back to normal recently. Overall she feels well. Her weight is down about 40 pounds of the recently she gained about 20 pounds back. She is working on that right as we speak.  04/03/2017  Jillian Eaton was seen today in follow-up.  She recently called in and had complaints of worsening shortness of breath.  She saw her pulmonary doctor who did an x-ray noted a mild right upper lobe infiltrate versus edema.  Labs were obtained including BNP which was only mildly elevated at 110.  She was given 20 mg of Lasix with no benefit and then recently was advised over the phone to increase her Lasix to 40 mg for 2 days.  She urinated quite a bit and lost about 5 pounds of what she feels was water weight.  She reports mild improvement in her breathing.  She is quite anxious about what might have led to this fluid gain.  Given her history of cardiomyopathy, it is possible she could have had some recurrent cardiomyopathy.  She reports stable diet and  no worsening abuse of sodium.  06/19/2017  Jillian Eaton returns today for follow-up.  She was supposed to have a limited echocardiogram but that never happened.  Approximately 2 to 3 weeks ago she called the office reporting palpitations.  We try to schedule an earlier appointment, but she ended up going to [facility] for her [facility].  On the flight she apparently became short of breath had some left flank pain and was hypoxic.  She was given oxygen and after landing they took her to the [facility] in [place].  She was treated there and found to be in A. fib with RVR.  This is a new finding.  She had a remote history of either PAT or PAF, but so brief that she was not anticoagulated.  She was then started on Xarelto.  She tells me she was given Lasix and diuresed.  She returns today for follow-up and still reports some shortness of breath.  Weight is about 6 pounds heavier than she was 2 months ago.  She is in A. fib persistently with heart rates in the low 100s.  In addition she reports after starting the Xarelto that she has been having some vaginal bleeding.  07/26/2017  Jillian Eaton returns today for follow-up.  She was recently seen in the hospital and discharged on 07/17/2017.  Today is a transition of care follow-up.  She was contacted on the day after discharge and felt to be doing well.  She is currently in rehabilitation.  She says she has had marked improvement in her shortness of breath.  She feels like her edema has improved.  She has decreased her dose of daily Lasix from 80 mg to 40 mg.  She is in persistent atrial fibrillation however rate controlled on amiodarone 400 mg which she is taking twice daily.  She reports she is getting stronger and hopefully will be out of rehabilitation within the next week.  She did miss 1 dose of Eliquis due to a transfer issue on May 28 and therefore will need 3 weeks of subsequent uninterrupted anticoagulation prior to attempted  cardioversion.  09/13/2017  Jillian Eaton returns today for follow-up of cardioversion.  Unfortunately this was unsuccessful after 3 shocks.  She told me there is some confusion afterwards that she forgot to take her amiodarone.  She was taking it up until the cardioversion.  Since it was unsuccessful, I recommended rate control strategy.  At this point there is little benefit from being on amiodarone and the side effect profile would not make it favorable.  I would recommend just continuing carvedilol for rate control.  She is had significant weight loss now down to 313 from 340.  This will continue to help her symptoms.  Her shortness of breath has improved.  She has had no significant worsening edema.  No further significant GU bleeding.  09/28/2017  Jillian Eaton returns today for follow-up of multiple ER visits.  I last saw her a couple weeks ago and unfortunately she was maintaining A. fib after unsuccessful cardioversion.  She continues to lose weight.  In fact she is down to 306 pounds today from 313.  Lab work indicates a rising creatinine and very low BNP, which makes me feel that she may be over diuresed.  She is also noted to be anemic with iron deficiency and is anticoagulated on Eliquis.  Her PCP is likely to start her on iron.  She denies any blood in the stool however stool guaiacs or work-up for microscopic iron is indicated.  She reports intermittent hematuria which is been minor.  Blood pressure is noted to be low today 94/69.  She is on low-dose carvedilol for rate control as well as hydrochlorothiazide and low-dose Lasix.  Recently she said she has had improvement in her breathing and for the first time the other day was able to sleep without oxygen.  10/12/2017  Jillian Eaton was seen again today in follow-up.  She called the office that she noted to have significant weight gain.  In July she was 313 and had lost weight down to 297.  Subsequently was found out that she was possibly overtreated  with her thyroid medication and her dose was decreased from 300 mcg to 200 mcg daily by her endocrinologist.  Since then she has gained weight but she has been up 12 pounds over the past 2 weeks.  She does report some edema.  I had stopped her hydrochlorothiazide due to hypotension and blood pressure is better today 112/70.  She takes low-dose  Lasix 20 mg daily.  She also reported that she now has stage III chronic kidney disease.  12/13/2017  Jillian Eaton returns today for follow-up.  She is done well and felt very stable.  Although her weight is up about 10 pounds she denies any worsening fluid.  She remains on 20 mg Lasix daily.  She is been working with her endocrinologist who reduced her levothyroxine.  Some of the weight gain may be related to that as she was on a very high dose of 300 mcg daily.  She denies any worsening shortness of breath.  She is having problems with her right shoulder and is scheduled to go arthroplasty by Dr. Alma Friendly.  She is here today for preoperative risk assessment.  She denies any chest pain.  She is on Eliquis for A. fib which is persistent but rate control.  She would have to stop this typically about 3 days prior to the procedure.  01/15/2018  Jillian Eaton is seen today for follow-up of recent surgery.  Actually she did not have surgery rather was induced by anesthesia and then had significant hypotension and instability requiring IV fluids and pressors.  Based on that response anesthesia recommended that the surgery be canceled.  I spoke with Dr. Alma Friendly subsequent to that about the episode and agreed to see her back to help determine why she may have had such a response.  She actually had significant improvement in LV function by echo 6 months ago back to normal.  She has not had recurrent A. fib and has been euvolemic.  She is on a diuretic.  Weight has been going back up however this is been related to changing her thyroid medications as she was iatrogenically hyperthyroid.   Stress testing was not pursued because she has a history of normal coronaries by cath in 2012 and since her EF improved recently with medical therapy alone, this would have been unlikely if she had significant coronary disease.  I suspect that her hypotension was due to being volume depleted and that she had a marked response to the propofol leading to her hypotension.  06/04/2018  Jillian Eaton was seen today for hospital follow-up as a transition of care 7 visit.  Unfortunately she was admitted with confusion and seizure.  She was started on Keppra.  She did recover from this but was noted to be in A. fib with RVR.  Her carvedilol was switched to Toprol-XL 100 mg daily.  As she had hypotension, her diuretic was discontinued.  Today she reports her weight is even lower.  She denies any new swelling.  She has had some confusion and feels hot and chilled at times.  We had difficulty getting her blood pressure today however I believe it is between 90 and 630 systolic.  This is consistent with her discharge blood pressure readings.  She is concerned she may be on too much metoprolol.  09/09/2018  Jillian Eaton is seen today in follow-up.  Overall she is doing well.  In fact she is lost more weight.  She is now 249 pounds.  She is trying to lose enough weight where she might be a candidate for surgery.  She is not had any further seizure activity.  She does have follow-up with her neurologist tomorrow who may adjust her antiepileptic medications.  I had decreased her beta-blocker because of hypotension.  Blood pressure was actually not able to be obtained today.  She says at home generally runs around 160 systolic.  She  is not had any presyncopal episodes related to this.  Overall she feels well.  She is actually expressed some interest in exertion and perhaps pulmonary rehabilitation for diastolic heart failure.  She generally has NYHA class II-III symptoms.  12/05/2018  Jillian Eaton is seen back today in follow-up.  In  the interim she is [name], NP, is working with her for shortness of breath.  Some additional testing was performed that showed some anemia.  Was also thought may be there was some congestive heart failure.  She was then seen in the ER with concerns about possible DVT/PE.  Work-up there was negative and the chest x-ray suggested the possibility of a pericardial effusion and further testing was recommended.  BNP was close to 300 but not significantly elevated.  Subsequent to that she had started taking Lasix now on a daily basis.  Her last echo showed a low normal LVEF of 50 to 55% with trivial pericardial fluid back in April 2020.  She remains in persistent atrial fibrillation which is rate controlled.  01/24/2019  Ms. Cheyney is seen today for follow-up of her dyspnea.  She continues to have shortness of breath.  She is scheduled to have another knee replacement surgery coming up.  She is also ordered for pulmonary function testing and does have a pulmonary follow-up in January.  I suspect there may be a pulmonary issue.  Her weight is gone up about 10 pounds as well however she feels that this may be fluid related.  She is also noted increased lower extremity swelling.  She has been using Lasix 40 mg as needed but not regularly.  I think she may need to be on daily diuretic.  PMHx:  Past Medical History:  Diagnosis Date   Anemia    Asthma    flare up last yr   CHF (congestive heart failure) (HCC)    Chronic headache    " better now "    DOE (dyspnea on exertion)    2D ECHO, 02/12/2012 - EF 60-65%, moderate concentric hypertrophy   Fibromyalgia    nerve pain"left side at waist level" "can't lay on that side without pain" , "HOB elevation helps"; 9-17- since i lost the wieght , i dont  think i have this anymore    Heart murmur    Hematuria - cause not known    resolved    History of kidney stones    x 2 '13, '14 surgery to remove   Hypertension    SBO (small bowel obstruction)  ([place]) 06/07/2013   SBO (small bowel obstruction) ([place])    rqueired admission 2020   Sleep apnea    tested several times and came up negative   Thyroid disease    "goiter"   Transfusion history    10 yrs+    Past Surgical History:  Procedure Laterality Date   ABDOMINAL ADHESION SURGERY  2004   open LOA - in [place]  04/04/2010   No significant obstructive coronary artery disease   CARDIOVERSION N/A 08/08/2017   Procedure: CARDIOVERSION;  Surgeon: Pixie Casino, MD;  Location: [facility];  Service: Cardiovascular;  Laterality: N/A;   CHOLECYSTECTOMY  1990   COLONOSCOPY W/ POLYPECTOMY     COLONOSCOPY WITH PROPOFOL N/A 04/10/2014   Procedure: COLONOSCOPY WITH PROPOFOL;  Surgeon: Beryle Beams, MD;  Location: [facility];  Service: Endoscopy;  Laterality: N/A;   EYE SURGERY     lasik 20-25 yrs ago  GASTROPLASTY VERTICAL BANDED  [place]   In [place] for SBO   REVERSE SHOULDER ARTHROPLASTY Right 03/29/2018   Procedure: REVERSE SHOULDER ARTHROPLASTY;  Surgeon: Netta Cedars, MD;  Location: [facility];  Service: Orthopedics;  Laterality: Right;   ROUX-EN-Y GASTRIC BYPASS  1992   Conversion VBG to RnYGB in [place], [place]   SHOULDER ARTHROSCOPY WITH SUBACROMIAL DECOMPRESSION  2016   Dr Jannett Celestine, [place]   SMALL INTESTINE SURGERY  2000   SBO - LOA w SB resection   TOTAL KNEE ARTHROPLASTY Left 11/14/2018   Procedure: TOTAL KNEE ARTHROPLASTY;  Surgeon: Paralee Cancel, MD;  Location: [facility];  Service: Orthopedics;  Laterality: Left;  70 mins   TOTAL THYROIDECTOMY  06/26/2012   Dr Celine Ahr, [facility]   TUBAL LIGATION  1986   URETHRAL DILATION  2012   w cystoscopy.  Dr Gaynelle Arabian    FAMHx:  Family History  Problem Relation Age of Onset   Diabetes Mother    Epilepsy Mother    Cancer Mother        Breast   Hypertension Mother    Breast cancer Mother    Seizures Mother    Kidney  disease Father    Diabetes Father    Hypertension Father    Asthma Father    Heart disease Father    Epilepsy Sister    Cancer Maternal Grandmother    Breast cancer Maternal Grandmother    Cancer Paternal Grandmother    Breast cancer Paternal Grandmother     SOCHx:   reports that she has never smoked. She has never used smokeless tobacco. She reports that she does not drink alcohol or use drugs.  ALLERGIES:  Allergies  Allergen Reactions   Hydrocodone Itching    Can tolerate with benadryl      ROS: Pertinent items noted in HPI and remainder of comprehensive ROS otherwise negative.  HOME MEDS: Current Outpatient Medications  Medication Sig Dispense Refill   furosemide (LASIX) 40 MG tablet Take 40 mg by mouth as needed.     metoprolol succinate (TOPROL-XL) 25 MG 24 hr tablet Take 25 mg by mouth 2 (two) times daily.     acetaminophen (TYLENOL) 650 MG CR tablet Take 650 mg by mouth every 8 (eight) hours as needed for pain.     apixaban (ELIQUIS) 5 MG TABS tablet Take 1 tablet (5 mg total) by mouth 2 (two) times daily. 60 tablet 0   calcitRIOL (ROCALTROL) 0.25 MCG capsule Take 1 capsule (0.25 mcg total) by mouth 2 (two) times daily. 180 capsule 3   cephALEXin (KEFLEX) 500 MG capsule Keflex 500 mg capsule  Take 1 capsule every 6 hours by oral route for 21 days.     clotrimazole (LOTRIMIN) 1 % cream APPLY TO AFFECTED AREA TWICE A DAY 45 g 0   cyanocobalamin (,VITAMIN B-12,) 1000 MCG/ML injection Inject 1 mL (1,000 mcg total) into the muscle every 30 (thirty) days. 1 mL 0   cyclobenzaprine (FLEXERIL) 10 MG tablet Take 2 tablets (20 mg total) by mouth 2 (two) times daily. 80 tablet 0   diclofenac Sodium (VOLTAREN) 1 % GEL diclofenac 1 % topical gel  pt uses as needed     DULoxetine (CYMBALTA) 60 MG capsule Take 1 capsule (60 mg total) by mouth daily. 30 capsule 0   eszopiclone (LUNESTA) 2 MG TABS tablet Take 1 tablet (2 mg total) by mouth at bedtime. 20 tablet 0    Fluticasone  Furoate (ARNUITY ELLIPTA) 100 MCG/ACT AEPB Inhale 1 puff into the lungs daily. 30 each 5   hyoscyamine (LEVSIN SL) 0.125 MG SL tablet Place 1 tablet (0.125 mg total) under the tongue every 4 (four) hours as needed for cramping (abdominal pain). 30 tablet 0   levETIRAcetam (KEPPRA) 500 MG tablet Take 1 tablet (500 mg total) by mouth 2 (two) times daily. 180 tablet 1   levothyroxine (SYNTHROID) 150 MCG tablet Take 1 tablet (150 mcg total) by mouth daily. 45 tablet 3   magnesium oxide (MAG-OX) 400 MG tablet Take 1 tablet (400 mg total) by mouth daily. 30 tablet 0   mirabegron ER (MYRBETRIQ) 50 MG TB24 tablet Take 1 tablet (50 mg total) by mouth daily. 30 tablet 0   NONFORMULARY OR COMPOUNDED ITEM [facility]:  Antifungal Cream - Terbinafine 3%, Fluconazole 2%, Tea Tree Oil 5%, Urea 10%, Ibuprofen 2% in DMSO Suspension #23m. Apply to affected toenail(s) once daily (at bedtime) or twice daily. 30 each 11   ondansetron (ZOFRAN) 4 MG tablet Take 1 tablet (4 mg total) by mouth every 6 (six) hours as needed for nausea or vomiting. 20 tablet 0   polyethylene glycol (MIRALAX / GLYCOLAX) 17 g packet Take 17 g by mouth daily as needed for moderate constipation. (Patient not taking: Reported on 01/09/2019) 14 each 0   traMADol (ULTRAM) 50 MG tablet Take 50 mg by mouth every 6 (six) hours.     traZODone (DESYREL) 50 MG tablet Take 1 tablet (50 mg total) by mouth at bedtime. 30 tablet 0   Vitamin D, Ergocalciferol, (DRISDOL) 1.25 MG (50000 UT) CAPS capsule Take 1 capsule (50,000 Units total) by mouth every Wednesday. 4 capsule 0   Zoster Vaccine Adjuvanted (SHINGRIX) injection Shingrix (PF) 50 mcg/0.5 mL intramuscular suspension, kit     No current facility-administered medications for this visit.     LABS/IMAGING: No results found for this or any previous visit (from the past 48 hour(s)). No results found.  VITALS: BP 102/86    Pulse 67    Ht 5' 6"  (1.676 m)    Wt 229 lb  (103.9 kg)    SpO2 94%    BMI 36.96 kg/m   EXAM: General appearance: alert and moderately obese Neck: no carotid bruit and no JVD Lungs: diminished breath sounds bilaterally Heart: irregularly irregular rhythm Abdomen: soft, non-tender; bowel sounds normal; no masses,  no organomegaly Extremities: edema 1-2+ bilateral lower extremity edema Pulses: 2+ and symmetric Skin: Skin color, texture, turgor normal. No rashes or lesions Neurologic: Grossly normal Psych: Pleasant  EKG: Deferred   ASSESSMENT: 1. Acute on chronic diastolic HF 2. Anesthesia-associated hypotension 3. New onset A. fib with RVR-CHADSVASC score of 4 -failed cardioversion on amiodarone 4. Chronic diastolic congestive heart failure-LVEF 25% (improved to 60-65% in 06/2017) 5. Super morbid obesity, with failure of 3 gastric bypass procedures 6. Pulmonary hypertension - PA pressure of 64 mmHg, normal LV systolic function 7. Progressive DOE 8. Hypertension-controlled  9. Possible A. fib/ectopic atrial tachycardia-resolved 10. Seizures  PLAN: 1.   Ms. MRobledoagain has had weight gain and swelling most consistent with acute on chronic diastolic heart failure.  There is probably a pulmonary component as well and she will have some repeat testing in January.  She is scheduled to have knee surgery as well later this month and I would like to get her more compensated prior to that.  I recommend we go on to daily 40 mg Lasix dosing for at least a  week to try to get her weight back to baseline less than 220 pounds.  At that time she could decrease the Lasix down to 20 mg daily for maintenance as she felt in the past that she could not tolerate 40 mg a day.  Follow-up with me in about 2 months.  Pixie Casino, MD, [facility], [place] Director of the [facility]  Card[facility] Diplomate of the American Board of Clinical Lipidology Attending Cardiologist   Direct Dial: [phone]   Fax: [phone]  Website:  [email or web address] 01/24/2019, 10:48 AM

## 2019-01-26 ENCOUNTER — Encounter: Payer: Self-pay | Admitting: Internal Medicine

## 2019-02-05 ENCOUNTER — Other Ambulatory Visit: Payer: Self-pay | Admitting: Internal Medicine

## 2019-02-06 NOTE — H&P (Signed)
TOTAL KNEE ADMISSION H&P  Patient is being admitted for right total knee arthroplasty.  Subjective:  Chief Complaint:    Right knee primary OA / pain  HPI: Jillian Eaton, 68 y.o. female, has a history of pain and functional disability in the right knee due to arthritis and has failed non-surgical conservative treatments for greater than 12 weeks to includeNSAID's and/or analgesics, corticosteriod injections, viscosupplementation injections, use of assistive devices and activity modification.  Onset of symptoms was gradual, starting >10 years ago with gradually worsening course since that time. The patient noted prior procedures on the knee to include  arthroplasty on the left knee(s).  Patient currently rates pain in the right knee(s) at 10 out of 10 with activity. Patient has night pain, worsening of pain with activity and weight bearing, pain that interferes with activities of daily living, pain with passive range of motion, crepitus and joint swelling.  Patient has evidence of periarticular osteophytes and joint space narrowing by imaging studies.  There is no active infection.  Risks, benefits and expectations were discussed with the patient.  Risks including but not limited to the risk of anesthesia, blood clots, nerve damage, blood vessel damage, failure of the prosthesis, infection and up to and including death.  Patient understand the risks, benefits and expectations and wishes to proceed with surgery.   PCP: [name], MD  D/C Plans:       Home   Post-op Meds:       No Rx given  Tranexamic Acid:      To be given - IV   Decadron:      Is to be given  FYI:     Eliquis (on pre-op)  Dilaudid  DME:   Pt already has equipment   PT:   OPPT @ [facility]    Patient Active Problem List   Diagnosis Date Noted  . Stiffness of left knee 12/12/2018  . Acute on chronic respiratory failure with hypoxia ([place]) 12/07/2018  . Hypothyroidism 12/07/2018  . Normocytic anemia  12/07/2018  . Asthma 12/07/2018  . S/P left TKA 11/14/2018  . Status post total left knee replacement 11/14/2018  . Degenerative joint disease involving multiple joints on both sides of body 07/31/2018  . Rotator cuff tear arthropathy of right shoulder 04/05/2018  . Overactive bladder 04/05/2018  . Steroid-induced hyperglycemia   . Generalized OA   . 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 NICM (nonischemic cardiomyopathy) ([place]) 01/15/2018  . Osteoporosis 12/13/2017  . Rotator cuff tear arthropathy 11/29/2017  . Chronic diastolic CHF (congestive heart failure) ([place]) 09/28/2017  . Acute renal failure superimposed on stage 3 chronic kidney disease ([place]) 09/07/2017  . Hypomagnesemia with secondary hypocalcemia 09/07/2017  . 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 Hypercapnemia 07/13/2017  . AKI (acute kidney injury) ([place]) 07/10/2017  . Vertigo 07/08/2017  . Blurring of visual image 07/08/2017  . On anticoagulant therapy 06/19/2017  . Chronic respiratory failure with hypoxia ([place]) 04/19/2017  . GERD (gastroesophageal reflux disease) 08/28/2016  . 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 Depressive disorder 08/28/2016  . Iatrogenic hypocalcemia 08/28/2016  . Chronic pain syndrome   . Acute diastolic (congestive) heart failure ([place])   . Oropharyngeal dysphagia   . Chronic atrial fibrillation ([place]) 04/28/2016  . Vocal cord dysfunction 04/28/2016  . Thrombocytopenia ([place]) 04/28/2016  . Seizures ([place])   . Preoperative cardiovascular examination   . 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  . Leg swelling 10/19/2015  . Acute on chronic respiratory  failure with hypoxia and hypercapnia ([place]) 08/03/2015  . Bilateral leg edema 05/11/2015  . Essential hypertension 05/11/2015  . Nephrolithiasis 04/12/2015  . Numbness in both hands 04/12/2015  . Complete tear of left rotator cuff 11/19/2014  . Primary osteoarthritis of both knees 03/24/2014  . Chronic asthmatic  bronchitis ([place]) 07/30/2013  . Bariatric surgery status 07/30/2013  . 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 Urge incontinence of urine 07/30/2013  . Vitamin D deficiency 07/30/2013  . B-complex deficiency 07/30/2013  . Cough 07/30/2013  . Gross hematuria 07/30/2013  . Goiter 07/30/2013  . Palpitations 07/30/2013  . Right flank pain 07/30/2013  . SBO (small bowel obstruction) ([place]) 06/07/2013  . Pulmonary HTN ([place]) 01/08/2013  . Insomnia 03/22/2012  . Mild intermittent asthma with acute exacerbation 03/22/2012  . Fibromyalgia 10/26/2011  . Anemia of chronic disease 03/28/2011  . Morbid obesity with body mass index of 40.0-49.9 ([place]) 09/22/2010   Past Medical History:  Diagnosis Date  . Anemia   . Asthma    flare up last yr  . CHF (congestive heart failure) ([place])   . Chronic headache    " better now "   . DOE (dyspnea on exertion)    2D ECHO, 02/12/2012 - EF 60-65%, moderate concentric hypertrophy  . Fibromyalgia    nerve pain"left side at waist level" "can't lay on that side without pain" , "HOB elevation helps"; 9-17- since i lost the wieght , i dont  think i have this anymore   . Heart murmur   . Hematuria - cause not known    resolved   . History of kidney stones    x 2 '13, '14 surgery to remove  . Hypertension   . SBO (small bowel obstruction) ([place]) 06/07/2013  . SBO (small bowel obstruction) ([place])    rqueired admission 2020  . Sleep apnea    tested several times and came up negative  . Thyroid disease    "goiter"  . Transfusion history    10 yrs+    Past Surgical History:  Procedure Laterality Date  . ABDOMINAL ADHESION SURGERY  2004   open LOA - in [place]  . CARDIAC CATHETERIZATION  04/04/2010   No significant obstructive coronary artery disease  . CARDIOVERSION N/A 08/08/2017   Procedure: CARDIOVERSION;  Surgeon: Pixie Casino, MD;  Location: [facility];  Service: Cardiovascular;  Laterality: N/A;  . CHOLECYSTECTOMY  1990  . COLONOSCOPY W/ POLYPECTOMY     . COLONOSCOPY WITH PROPOFOL N/A 04/10/2014   Procedure: COLONOSCOPY WITH PROPOFOL;  Surgeon: Beryle Beams, MD;  Location: [facility];  Service: Endoscopy;  Laterality: N/A;  . EYE SURGERY     lasik 20-25 yrs ago  . GASTROPLASTY VERTICAL BANDED  1985  . [place]   In [place] for SBO  . REVERSE SHOULDER ARTHROPLASTY Right 03/29/2018   Procedure: REVERSE SHOULDER ARTHROPLASTY;  Surgeon: Netta Cedars, MD;  Location: [facility];  Service: Orthopedics;  Laterality: Right;  . ROUX-EN-Y GASTRIC BYPASS  1992   Conversion VBG to RnYGB in [place], [place]  . SHOULDER ARTHROSCOPY WITH SUBACROMIAL DECOMPRESSION  2016   Dr Berenice Primas, [place], [place]  . SMALL INTESTINE SURGERY  2000   SBO - LOA w SB resection  . TOTAL KNEE ARTHROPLASTY Left 11/14/2018   Procedure: TOTAL KNEE ARTHROPLASTY;  Surgeon: Paralee Cancel, MD;  Location: [facility];  Service: Orthopedics;  Laterality: Left;  70 mins  . TOTAL THYROIDECTOMY  06/26/2012   Dr Celine Ahr, [place]  [facility]  . TUBAL LIGATION  1986  . URETHRAL DILATION  2012   w cystoscopy.  Dr Gaynelle Arabian    No current facility-administered medications for this encounter.   Current Outpatient Medications  Medication Sig Dispense Refill Last Dose  . acetaminophen (TYLENOL) 650 MG CR tablet Take 650-1,300 mg by mouth every 8 (eight) hours as needed for pain.      . calcitRIOL (ROCALTROL) 0.25 MCG capsule Take 1 capsule (0.25 mcg total) by mouth 2 (two) times daily. 180 capsule 3   . clotrimazole (LOTRIMIN) 1 % cream APPLY TO AFFECTED AREA TWICE A DAY (Patient taking differently: Apply 1 application topically 2 (two) times daily as needed (skin irritation.). ) 45 g 0   . cyanocobalamin (,VITAMIN B-12,) 1000 MCG/ML injection Inject 1 mL (1,000 mcg total) into the muscle every 30 (thirty) days. 1 mL 0   . cyclobenzaprine (FLEXERIL) 10 MG tablet Take 2 tablets (20 mg total) by mouth 2 (two) times daily. (Patient taking differently: Take 10 mg by  mouth 3 (three) times daily as needed for muscle spasms. ) 80 tablet 0   . DULoxetine (CYMBALTA) 60 MG capsule Take 1 capsule (60 mg total) by mouth daily. 30 capsule 0   . eszopiclone (LUNESTA) 2 MG TABS tablet Take 1 tablet (2 mg total) by mouth at bedtime. 20 tablet 0   . Fluticasone Furoate (ARNUITY ELLIPTA) 100 MCG/ACT AEPB Inhale 1 puff into the lungs daily. 30 each 5   . furosemide (LASIX) 20 MG tablet Take 1 tablet (20 mg total) by mouth as needed. (Patient taking differently: Take 20 mg by mouth daily. ) 90 tablet 2   . levETIRAcetam (KEPPRA) 500 MG tablet Take 1 tablet (500 mg total) by mouth 2 (two) times daily. 180 tablet 1   . levothyroxine (SYNTHROID) 150 MCG tablet Take 1 tablet (150 mcg total) by mouth daily. (Patient taking differently: Take 150 mcg by mouth daily before breakfast. ) 45 tablet 3   . magnesium oxide (MAG-OX) 400 MG tablet Take 1 tablet (400 mg total) by mouth daily. 30 tablet 0   . metoprolol succinate (TOPROL-XL) 25 MG 24 hr tablet Take 25 mg by mouth 2 (two) times daily.     . mirabegron ER (MYRBETRIQ) 50 MG TB24 tablet Take 1 tablet (50 mg total) by mouth daily. 30 tablet 0   . NONFORMULARY OR COMPOUNDED ITEM [facility]:  Antifungal Cream - Terbinafine 3%, Fluconazole 2%, Tea Tree Oil 5%, Urea 10%, Ibuprofen 2% in DMSO Suspension #42ml. Apply to affected toenail(s) once daily (at bedtime) or twice daily. (Patient taking differently: Apply 1 application topically at bedtime. [facility]:  Antifungal Cream - Terbinafine 3%, Fluconazole 2%, Tea Tree Oil 5%, Urea 10%, Ibuprofen 2% in DMSO Suspension #58ml. Apply to affected toenail(s) once daily (at bedtime) or twice daily.) 30 each 11   . Polyethyl Glycol-Propyl Glycol (LUBRICANT EYE DROPS) 0.4-0.3 % SOLN Place 1 drop into both eyes 3 (three) times daily as needed (dry/irritated eyes.).     Marland Kitchen traMADol (ULTRAM) 50 MG tablet Take 50 mg by mouth every 6 (six) hours as needed (pain).      . traZODone  (DESYREL) 50 MG tablet Take 1 tablet (50 mg total) by mouth at bedtime. 30 tablet 0   . Vitamin D, Ergocalciferol, (DRISDOL) 1.25 MG (50000 UT) CAPS capsule Take 1 capsule (50,000 Units total) by mouth every Wednesday. 4 capsule 0   . diclofenac Sodium (VOLTAREN) 1 % GEL Apply 1 application topically  4 (four) times daily as needed (pain.).      Marland Kitchen ELIQUIS 5 MG TABS tablet TAKE 1 TABLET TWICE A DAY 180 tablet 3   . hyoscyamine (LEVSIN SL) 0.125 MG SL tablet Place 1 tablet (0.125 mg total) under the tongue every 4 (four) hours as needed for cramping (abdominal pain). (Patient not taking: Reported on 02/04/2019) 30 tablet 0 Not Taking at Unknown time  . ondansetron (ZOFRAN) 4 MG tablet Take 1 tablet (4 mg total) by mouth every 6 (six) hours as needed for nausea or vomiting. (Patient not taking: Reported on 02/04/2019) 20 tablet 0 Not Taking at Unknown time  . polyethylene glycol (MIRALAX / GLYCOLAX) 17 g packet Take 17 g by mouth daily as needed for moderate constipation. (Patient not taking: Reported on 01/09/2019) 14 each 0    Allergies  Allergen Reactions  . Hydrocodone Itching    Can tolerate with benadryl      Social History   Tobacco Use  . Smoking status: Never Smoker  . Smokeless tobacco: Never Used  Substance Use Topics  . Alcohol use: No    Comment: wine occ    Family History  Problem Relation Age of Onset  . Diabetes Mother   . Epilepsy Mother   . Cancer Mother        Breast  . Hypertension Mother   . Breast cancer Mother   . Seizures Mother   . Kidney disease Father   . Diabetes Father   . Hypertension Father   . Asthma Father   . Heart disease Father   . Epilepsy Sister   . Cancer Maternal Grandmother   . Breast cancer Maternal Grandmother   . Cancer Paternal Grandmother   . Breast cancer Paternal Grandmother      Review of Systems  Constitutional: Negative.   HENT: Negative.   Eyes: Negative.   Respiratory: Negative.   Cardiovascular: Negative.    Gastrointestinal: Positive for heartburn.  Genitourinary: Negative.   Musculoskeletal: Positive for joint pain.  Skin: Negative.   Neurological: Negative.   Endo/Heme/Allergies: Negative.   Psychiatric/Behavioral: Negative.     Objective:  Physical Exam  Constitutional: She is oriented to person, place, and time. She appears well-developed.  HENT:  Head: Normocephalic.  Eyes: Pupils are equal, round, and reactive to light.  Neck: No JVD present. No tracheal deviation present. No thyromegaly present.  Cardiovascular: Normal rate, regular rhythm and intact distal pulses.  Hx of a-fib  Respiratory: Effort normal and breath sounds normal. No respiratory distress. She has no wheezes.  GI: Soft. There is no abdominal tenderness. There is no guarding.  Musculoskeletal:     Cervical back: Neck supple.     Right knee: Swelling and bony tenderness present. No deformity, erythema, ecchymosis or lacerations. Decreased range of motion. Tenderness present.  Lymphadenopathy:    She has no cervical adenopathy.  Neurological: She is alert and oriented to person, place, and time.  Skin: Skin is warm and dry.  Psychiatric: She has a normal mood and affect.     Labs:  Estimated body mass index is 36.96 kg/m as calculated from the following:   Height as of 01/24/19: 5\' 6"  (1.676 m).   Weight as of 01/24/19: 103.9 kg.   Imaging Review Plain radiographs demonstrate severe degenerative joint disease of the right knee. The bone quality appears to be good for age and reported activity level.      Assessment/Plan:  End stage arthritis, right knee   The patient  history, physical examination, clinical judgment of the provider and imaging studies are consistent with end stage degenerative joint disease of the right knee(s) and total knee arthroplasty is deemed medically necessary. The treatment options including medical management, injection therapy arthroscopy and arthroplasty were discussed at  length. The risks and benefits of total knee arthroplasty were presented and reviewed. The risks due to aseptic loosening, infection, stiffness, patella tracking problems, thromboembolic complications and other imponderables were discussed. The patient acknowledged the explanation, agreed to proceed with the plan and consent was signed. Patient is being admitted for inpatient treatment for surgery, pain control, PT, OT, prophylactic antibiotics, VTE prophylaxis, progressive ambulation and ADL's and discharge planning. The patient is planning to be discharged home.     Patient's anticipated LOS is less than 2 midnights, meeting these requirements: - Lives within 1 hour of care - Has a competent adult at home to recover with post-op recover - NO history of  - Chronic pain requiring opiods  - Diabetes  - Coronary Artery Disease  - Heart failure  - Heart attack  - Stroke  - DVT/VTE  - Respiratory Failure/COPD  - Renal failure  - Anemia  - Advanced Liver disease

## 2019-02-07 ENCOUNTER — Other Ambulatory Visit: Payer: Self-pay | Admitting: Podiatry

## 2019-02-07 ENCOUNTER — Telehealth: Payer: Self-pay | Admitting: Pulmonary Disease

## 2019-02-07 NOTE — Telephone Encounter (Signed)
02/07/2019 1510  Patient's overnight oximetry results have been received by our office.  02/05/2019-overnight oximetry on room air-duration of sleep 5 hours and 53 minutes, time spent below 88% 21 minutes, time spent below 89% 33.3 minutes, SaO2 low 75%  Patient does qualify for oxygen.  Patient should start 2 L of O2 at night.  Patient also needs to be scheduled for pulmonary function testing this was ordered on 01/09/2019.  This needs to be coordinated.  Patient also needs follow-up with Dr. Halford Chessman or an APP as she was requested to follow-up in 4 weeks back in November/2020.  Please ensure she has upcoming follow-up sometime over the next 2 to 4 weeks with Dr. Halford Chessman or an APP.  We will route to triage and PCC's for follow-up.  Will route to Dr. Halford Chessman as Juluis Rainier.  Wyn Quaker, FNP

## 2019-02-07 NOTE — Patient Instructions (Addendum)
DUE TO COVID-19 ONLY ONE VISITOR IS ALLOWED TO COME WITH YOU AND STAY IN THE WAITING ROOM ONLY DURING PRE OP AND PROCEDURE DAY OF SURGERY. THE 1 VISITOR MAY VISIT WITH YOU AFTER SURGERY IN YOUR PRIVATE ROOM DURING VISITING HOURS ONLY!  YOU NEED TO HAVE A COVID 19 TEST ON:02/15/2019  @ 10:00 AM  , THIS TEST MUST BE DONE BEFORE SURGERY, COME  [place], [address] , [address].  ([place]) ONCE YOUR COVID TEST IS COMPLETED, PLEASE BEGIN THE QUARANTINE INSTRUCTIONS AS OUTLINED IN YOUR HANDOUT.                Aundria Jochimsen Ellenbecker    Your procedure is scheduled on: 02/18/2019   Report to [facility] Main  Entrance   Report to short stay at: 5:30 AM     Call this number if you have problems the morning of surgery [phone]     NO SOLID FOOD AFTER MIDNIGHT THE NIGHT PRIOR TO SURGERY. NOTHING BY MOUTH EXCEPT CLEAR LIQUIDS UNTIL: 4:15 AM . PLEASE FINISH ENSURE DRINK PER SURGEON ORDER  WHICH NEEDS TO BE COMPLETED AT : 4:15 AM.   CLEAR LIQUID DIET   Foods Allowed                                                                     Foods Excluded  Coffee and tea, regular and decaf                             liquids that you cannot  Plain Jell-O any favor except red or purple                                           see through such as: Fruit ices (not with fruit pulp)                                     milk, soups, orange juice  Iced Popsicles                                    All solid food Carbonated beverages, regular and diet                                    Cranberry, grape and apple juices Sports drinks like Gatorade Lightly seasoned clear broth or consume(fat free) Sugar, honey syrup  Sample Menu Breakfast                                Lunch                                     Supper Cranberry juice  Beef broth                            Chicken broth Jell-O                                     Grape juice                            Apple juice Coffee or tea                        Jell-O                                      Popsicle                                                Coffee or tea                        Coffee or tea  _____________________________________________________________________      Remember:  BRUSH YOUR TEETH MORNING OF SURGERY AND RINSE YOUR MOUTH OUT, NO CHEWING GUM CANDY OR MINTS.     Take these medicines the morning of surgery with A SIP OF WATER: calcitriol,duloxetine,kepra,levothyroxine.                                 You may not have any metal on your body including hair pins and              piercings  Do not wear jewelry, make-up, lotions, powders or perfumes, deodorant             Do not wear nail polish on your fingernails.  Do not shave  48 hours prior to surgery.                Do not bring valuables to the hospital. [place].  Contacts, dentures or bridgework may not be worn into surgery.  Leave suitcase in the car. After surgery it may be brought to your room.     Patients discharged the day of surgery will not be allowed to drive home. IF YOU ARE HAVING SURGERY AND GOING HOME THE SAME DAY, YOU MUST HAVE AN ADULT TO DRIVE YOU HOME AND BE WITH YOU FOR 24 HOURS. YOU MAY GO HOME BY TAXI OR UBER OR ORTHERWISE, BUT AN ADULT MUST ACCOMPANY YOU HOME AND STAY WITH YOU FOR 24 HOURS.  Name and phone number of your driver:  Special Instructions: N/A              Please read over the following fact sheets you were given: _____________________________________________________________________  [facility] - Preparing for Surgery Before surgery, you can play an important role.  Because skin is not sterile, your skin needs to be as free of germs as possible.  You can reduce the number of germs on your skin by  washing with CHG (chlorahexidine gluconate) soap before surgery.  CHG is an antiseptic cleaner which kills germs and  bonds with the skin to continue killing germs even after washing. Please DO NOT use if you have an allergy to CHG or antibacterial soaps.  If your skin becomes reddened/irritated stop using the CHG and inform your nurse when you arrive at Short Stay. Do not shave (including legs and underarms) for at least 48 hours prior to the first CHG shower.  You may shave your face/neck. Please follow these instructions carefully:  1.  Shower with CHG Soap the night before surgery and the  morning of Surgery.  2.  If you choose to wash your hair, wash your hair first as usual with your  normal  shampoo.  3.  After you shampoo, rinse your hair and body thoroughly to remove the  shampoo.                           4.  Use CHG as you would any other liquid soap.  You can apply chg directly  to the skin and wash                       Gently with a scrungie or clean washcloth.  5.  Apply the CHG Soap to your body ONLY FROM THE NECK DOWN.   Do not use on face/ open                           Wound or open sores. Avoid contact with eyes, ears mouth and genitals (private parts).                       Wash face,  Genitals (private parts) with your normal soap.             6.  Wash thoroughly, paying special attention to the area where your surgery  will be performed.  7.  Thoroughly rinse your body with warm water from the neck down.  8.  DO NOT shower/wash with your normal soap after using and rinsing off  the CHG Soap.                9.  Pat yourself dry with a clean towel.            10.  Wear clean pajamas.            11.  Place clean sheets on your bed the night of your first shower and do not  sleep with pets. Day of Surgery : Do not apply any lotions/deodorants the morning of surgery.  Please wear clean clothes to the hospital/surgery center.  FAILURE TO FOLLOW THESE INSTRUCTIONS MAY RESULT IN THE CANCELLATION OF YOUR SURGERY PATIENT SIGNATURE_________________________________  NURSE  SIGNATURE__________________________________  ________________________________________________________________________   Adam Phenix  An incentive spirometer is a tool that can help keep your lungs clear and active. This tool measures how well you are filling your lungs with each breath. Taking long deep breaths may help reverse or decrease the chance of developing breathing (pulmonary) problems (especially infection) following:  A long period of time when you are unable to move or be active. BEFORE THE PROCEDURE   If the spirometer includes an indicator to show your best effort, your nurse or respiratory therapist will set it to a desired goal.  If possible, sit up  straight or lean slightly forward. Try not to slouch.  Hold the incentive spirometer in an upright position. INSTRUCTIONS FOR USE  1. Sit on the edge of your bed if possible, or sit up as far as you can in bed or on a chair. 2. Hold the incentive spirometer in an upright position. 3. Breathe out normally. 4. Place the mouthpiece in your mouth and seal your lips tightly around it. 5. Breathe in slowly and as deeply as possible, raising the piston or the ball toward the top of the column. 6. Hold your breath for 3-5 seconds or for as long as possible. Allow the piston or ball to fall to the bottom of the column. 7. Remove the mouthpiece from your mouth and breathe out normally. 8. Rest for a few seconds and repeat Steps 1 through 7 at least 10 times every 1-2 hours when you are awake. Take your time and take a few normal breaths between deep breaths. 9. The spirometer may include an indicator to show your best effort. Use the indicator as a goal to work toward during each repetition. 10. After each set of 10 deep breaths, practice coughing to be sure your lungs are clear. If you have an incision (the cut made at the time of surgery), support your incision when coughing by placing a pillow or rolled up towels firmly  against it. Once you are able to get out of bed, walk around indoors and cough well. You may stop using the incentive spirometer when instructed by your caregiver.  RISKS AND COMPLICATIONS  Take your time so you do not get dizzy or light-headed.  If you are in pain, you may need to take or ask for pain medication before doing incentive spirometry. It is harder to take a deep breath if you are having pain. AFTER USE  Rest and breathe slowly and easily.  It can be helpful to keep track of a log of your progress. Your caregiver can provide you with a simple table to help with this. If you are using the spirometer at home, follow these instructions: [place] IF:   You are having difficultly using the spirometer.  You have trouble using the spirometer as often as instructed.  Your pain medication is not giving enough relief while using the spirometer.  You develop fever of 100.5 F (38.1 C) or higher. SEEK IMMEDIATE MEDICAL CARE IF:   You cough up bloody sputum that had not been present before.  You develop fever of 102 F (38.9 C) or greater.  You develop worsening pain at or near the incision site. MAKE SURE YOU:   Understand these instructions.  Will watch your condition.  Will get help right away if you are not doing well or get worse. Document Released: 06/19/2006 Document Revised: 05/01/2011 Document Reviewed: 08/20/2006 ExitCare Patient Information 2014 ExitCare, [place].   ________________________________________________________________________             WHAT IS A BLOOD TRANSFUSION? Blood Transfusion Information  A transfusion is the replacement of blood or some of its parts. Blood is made up of multiple cells which provide different functions.  Red blood cells carry oxygen and are used for blood loss replacement.  White blood cells fight against infection.  Platelets control bleeding.  Plasma helps clot blood.  Other blood products are available  for specialized needs, such as hemophilia or other clotting disorders. BEFORE THE TRANSFUSION  Who gives blood for transfusions?   Healthy volunteers who are fully  evaluated to make sure their blood is safe. This is blood bank blood. Transfusion therapy is the safest it has ever been in the practice of medicine. Before blood is taken from a donor, a complete history is taken to make sure that person has no history of diseases nor engages in risky social behavior (examples are intravenous drug use or sexual activity with multiple partners). The donor's travel history is screened to minimize risk of transmitting infections, such as malaria. The donated blood is tested for signs of infectious diseases, such as HIV and hepatitis. The blood is then tested to be sure it is compatible with you in order to minimize the chance of a transfusion reaction. If you or a relative donates blood, this is often done in anticipation of surgery and is not appropriate for emergency situations. It takes many days to process the donated blood. RISKS AND COMPLICATIONS Although transfusion therapy is very safe and saves many lives, the main dangers of transfusion include:   Getting an infectious disease.  Developing a transfusion reaction. This is an allergic reaction to something in the blood you were given. Every precaution is taken to prevent this. The decision to have a blood transfusion has been considered carefully by your caregiver before blood is given. Blood is not given unless the benefits outweigh the risks. AFTER THE TRANSFUSION  Right after receiving a blood transfusion, you will usually feel much better and more energetic. This is especially true if your red blood cells have gotten low (anemic). The transfusion raises the level of the red blood cells which carry oxygen, and this usually causes an energy increase.  The nurse administering the transfusion will monitor you carefully for complications. HOME CARE  INSTRUCTIONS  No special instructions are needed after a transfusion. You may find your energy is better. Speak with your caregiver about any limitations on activity for underlying diseases you may have. SEEK MEDICAL CARE IF:   Your condition is not improving after your transfusion.  You develop redness or irritation at the intravenous (IV) site. SEEK IMMEDIATE MEDICAL CARE IF:  Any of the following symptoms occur over the next 12 hours:  Shaking chills.  You have a temperature by mouth above 102 F (38.9 C), not controlled by medicine.  Chest, back, or muscle pain.  People around you feel you are not acting correctly or are confused.  Shortness of breath or difficulty breathing.  Dizziness and fainting.  You get a rash or develop hives.  You have a decrease in urine output.  Your urine turns a dark color or changes to pink, red, or brown. Any of the following symptoms occur over the next 10 days:  You have a temperature by mouth above 102 F (38.9 C), not controlled by medicine.  Shortness of breath.  Weakness after normal activity.  The white part of the eye turns yellow (jaundice).  You have a decrease in the amount of urine or are urinating less often.  Your urine turns a dark color or changes to pink, red, or brown. Document Released: 02/04/2000 Document Revised: 05/01/2011 Document Reviewed: 09/23/2007 [facility] Patient Information 2014 [place], [place].  _______________________________________________________________________

## 2019-02-07 NOTE — Telephone Encounter (Addendum)
Pt is scheduled for a PFT 1/19 & ROV w/ BPM after.  Offered patient an earlier PFT on 12/29 & ROV w/ BW afterwards, pt declined.  Pt would like to keep her appts as they are scheduled.  Nothing further needed at this time.

## 2019-02-10 ENCOUNTER — Encounter (HOSPITAL_COMMUNITY)
Admission: RE | Admit: 2019-02-10 | Discharge: 2019-02-10 | Disposition: A | Discharge status: Home / Self Care | Payer: Medicare Other | Source: Ambulatory Visit | Attending: Orthopedic Surgery | Admitting: Orthopedic Surgery

## 2019-02-10 ENCOUNTER — Encounter (HOSPITAL_COMMUNITY): Payer: Self-pay

## 2019-02-10 ENCOUNTER — Other Ambulatory Visit: Payer: Self-pay

## 2019-02-10 DIAGNOSIS — I130 Hypertensive heart and chronic kidney disease with heart failure and stage 1 through stage 4 chronic kidney disease, or unspecified chronic kidney disease: Secondary | ICD-10-CM | POA: Diagnosis not present

## 2019-02-10 DIAGNOSIS — Z01812 Encounter for preprocedural laboratory examination: Secondary | ICD-10-CM | POA: Diagnosis not present

## 2019-02-10 DIAGNOSIS — Z79899 Other long term (current) drug therapy: Secondary | ICD-10-CM | POA: Diagnosis not present

## 2019-02-10 DIAGNOSIS — N183 Chronic kidney disease, stage 3 unspecified: Secondary | ICD-10-CM | POA: Diagnosis not present

## 2019-02-10 DIAGNOSIS — I5032 Chronic diastolic (congestive) heart failure: Secondary | ICD-10-CM | POA: Diagnosis not present

## 2019-02-10 DIAGNOSIS — M1711 Unilateral primary osteoarthritis, right knee: Secondary | ICD-10-CM | POA: Diagnosis not present

## 2019-02-10 HISTORY — DX: Pneumonia, unspecified organism: J18.9

## 2019-02-10 HISTORY — DX: Cardiac arrhythmia, unspecified: I49.9

## 2019-02-10 NOTE — Progress Notes (Addendum)
PCP - Mobolaji Bakare. LOV: 02/03/2019  Chart Cardiologist Lyman Bishop. LOV: 01/24/2019- EPIC Pulmonologist:Dr. Halford Chessman. LOV 01/09/19 Chest x-ray - 12/06/2018. EPIC EKG - 12/23/2018 EPIC Stress Test -  ECHO - 12/08/2018 EPIC Cardiac Cath -   Sleep Study -  CPAP -   Fasting Blood Sugar -  Checks Blood Sugar _____ times a day  Blood Thinner Instructions: Aspirin Instructions:N/A Last Dose:  Anesthesia review: During the phone interview,RN asked pt. To weight herself and her weight today was 229 pounds. She said that she was instructed by her Cardiologist on the last visit to take 40 mg. Of Lasix for couple days and then go back to 20 mg. Daily. She said, that she did it on the weekend.After find out her weight today,she told the RN that she will take 40 mg.of Lasix again for couple days and then go back to 20 mg.RN recommended to call cardiologist to mentioned about her weight gain,but she insist that he've been instructed her to manage her fluid overload that way. Pt. Denied having edema on her legs,SOB or any chest discomfort.  Patient denies shortness of breath, fever, cough and chest pain at PAT appointment   Patient verbalized understanding of instructions that were given to them at the PAT appointment. Patient was also instructed that they will need to review over the PAT instructions again at home before surgery.PCP -  Cardiologist -

## 2019-02-11 ENCOUNTER — Encounter (HOSPITAL_COMMUNITY)
Admission: RE | Admit: 2019-02-11 | Discharge: 2019-02-11 | Disposition: A | Discharge status: Home / Self Care | Payer: Medicare Other | Source: Ambulatory Visit | Attending: Orthopedic Surgery | Admitting: Orthopedic Surgery

## 2019-02-11 DIAGNOSIS — Z01812 Encounter for preprocedural laboratory examination: Secondary | ICD-10-CM | POA: Insufficient documentation

## 2019-02-11 DIAGNOSIS — I5032 Chronic diastolic (congestive) heart failure: Secondary | ICD-10-CM | POA: Insufficient documentation

## 2019-02-11 DIAGNOSIS — M1711 Unilateral primary osteoarthritis, right knee: Secondary | ICD-10-CM | POA: Insufficient documentation

## 2019-02-11 DIAGNOSIS — I130 Hypertensive heart and chronic kidney disease with heart failure and stage 1 through stage 4 chronic kidney disease, or unspecified chronic kidney disease: Secondary | ICD-10-CM | POA: Insufficient documentation

## 2019-02-11 DIAGNOSIS — Z79899 Other long term (current) drug therapy: Secondary | ICD-10-CM | POA: Insufficient documentation

## 2019-02-11 DIAGNOSIS — N183 Chronic kidney disease, stage 3 unspecified: Secondary | ICD-10-CM | POA: Insufficient documentation

## 2019-02-11 LAB — BASIC METABOLIC PANEL
Anion gap: 9 (ref 5–15)
BUN: 24 mg/dL — ABNORMAL HIGH (ref 8–23)
CO2: 29 mmol/L (ref 22–32)
Calcium: 7.8 mg/dL — ABNORMAL LOW (ref 8.9–10.3)
Chloride: 103 mmol/L (ref 98–111)
Creatinine, Ser: 0.92 mg/dL (ref 0.44–1.00)
GFR calc Af Amer: >60 mL/min (ref >60)
GFR calc non Af Amer: >60 mL/min (ref >60)
Glucose, Bld: 78 mg/dL (ref 70–99)
Potassium: 4.1 mmol/L (ref 3.5–5.1)
Sodium: 141 mmol/L (ref 135–145)

## 2019-02-11 LAB — CBC
HCT: 28.6 % — ABNORMAL LOW (ref 36.0–46.0)
Hemoglobin: 8.5 g/dL — ABNORMAL LOW (ref 12.0–15.0)
MCH: 27.7 pg (ref 26.0–34.0)
MCHC: 29.7 g/dL — ABNORMAL LOW (ref 30.0–36.0)
MCV: 93.2 fL (ref 80.0–100.0)
Platelets: 201 10*3/uL (ref 150–400)
RBC: 3.07 MIL/uL — ABNORMAL LOW (ref 3.87–5.11)
RDW: 14.6 % (ref 11.5–15.5)
WBC: 4.5 10*3/uL (ref 4.0–10.5)
nRBC: 0 % (ref 0.0–0.2)

## 2019-02-11 LAB — SURGICAL PCR SCREEN
MRSA, PCR: NEGATIVE
Staphylococcus aureus: NEGATIVE

## 2019-02-11 NOTE — Progress Notes (Signed)
Patient has abnormal CBC and BMP... all results routed to [facility] PA and Dr. Alvan Dame for review.

## 2019-02-12 ENCOUNTER — Encounter (HOSPITAL_COMMUNITY): Payer: Self-pay | Admitting: Physician Assistant

## 2019-02-13 ENCOUNTER — Telehealth: Payer: Self-pay | Admitting: *Deleted

## 2019-02-13 NOTE — Telephone Encounter (Signed)
   [facility] Pre-operative Risk Assessment    Request for surgical clearance:  1. What type of surgery is being performed?  RIGHT TOTAL KNEE ARTHOPLASTY  2. When is this surgery scheduled? TBD  3. What type of clearance is required (medical clearance vs. Pharmacy clearance to hold med vs. Both)?  BOTH  4. Are there any medications that need to be held prior to surgery and how long? ELIQUIS  5. Practice name and name of physician performing surgery? [facility]; DR MATTHEW OLIN  6. What is your office phone number [phone]   7.   What is your office fax number [phone]  8.   Anesthesia type (None, local, MAC, general) ? spinal   Raiford Simmonds 02/13/2019, 8:14 AM  _________________________________________________________________   (provider comments below)

## 2019-02-15 ENCOUNTER — Other Ambulatory Visit (HOSPITAL_COMMUNITY): Payer: Medicare Other

## 2019-02-17 NOTE — Telephone Encounter (Signed)
Pharmacy please comment on Eliquis and I'll contact the patient for clearance.  Kerin Ransom PA-C 02/17/2019 10:04 AM

## 2019-02-17 NOTE — Telephone Encounter (Signed)
I called the patient to discuss pre op clearance.  Dr. [name] had seen her on December 4.  He felt she was volume overloaded and suggested she take 40 mg of Lasix until her weight came down to 220 pounds.  The patient said she only took the Lasix for 2 days.  She then cut back to 20 mg a day.  She told me she does not like taking the 40 mg of Lasix because it makes her go to the bathroom frequently.  Today she tells me her weight was 233 pounds.  It turns out that her surgery has been postponed, she was noted to be anemic with a hemoglobin of 8.5 on her preoperative labs.  She is to see her PCP tomorrow.  We will need to check with her again for clearance when her surgery gets rescheduled.  I suggested she try lasix 40 mg alternating with 20 mg QOD in the meanatime.   Kerin Ransom PA-C 02/17/2019 11:10 AM

## 2019-02-17 NOTE — Telephone Encounter (Signed)
Patient with diagnosis of afib on Eliquis for anticoagulation.    Procedure: RIGHT TOTAL KNEE ARTHOPLASTY Date of procedure: TBD  CHADS2-VASc score of  4 (CHF, HTN, AGE, female)  CrCl 74 ml/min  Per office protocol, patient can hold Eliquis for 3 days prior to procedure.

## 2019-02-17 NOTE — Telephone Encounter (Signed)
Thanks [employer].  -[place]

## 2019-02-18 ENCOUNTER — Ambulatory Visit: Payer: Medicare Other | Admitting: Internal Medicine

## 2019-02-18 ENCOUNTER — Ambulatory Visit (HOSPITAL_COMMUNITY): Admission: RE | Admit: 2019-02-18 | Payer: Medicare Other | Source: Home / Self Care | Admitting: Orthopedic Surgery

## 2019-02-18 ENCOUNTER — Encounter (HOSPITAL_COMMUNITY): Admission: RE | Payer: Self-pay | Source: Home / Self Care

## 2019-02-18 LAB — TYPE AND SCREEN
ABO/RH(D): O POS
Antibody Screen: NEGATIVE

## 2019-02-18 SURGERY — ARTHROPLASTY, KNEE, TOTAL
Anesthesia: Spinal | Site: Knee | Laterality: Right

## 2019-02-27 ENCOUNTER — Ambulatory Visit (HOSPITAL_COMMUNITY)
Admission: RE | Admit: 2019-02-27 | Discharge: 2019-02-27 | Disposition: A | Discharge status: Home / Self Care | Payer: Medicare Other | Source: Ambulatory Visit | Attending: Internal Medicine | Admitting: Internal Medicine

## 2019-02-27 ENCOUNTER — Other Ambulatory Visit: Payer: Self-pay

## 2019-02-27 DIAGNOSIS — D509 Iron deficiency anemia, unspecified: Secondary | ICD-10-CM | POA: Insufficient documentation

## 2019-02-27 MED ORDER — SODIUM CHLORIDE 0.9 % IV SOLN
INTRAVENOUS | Status: DC | PRN
  Administered 2019-02-27: 250 mL via INTRAVENOUS

## 2019-02-27 MED ORDER — SODIUM CHLORIDE 0.9 % IV SOLN
200.0000 mg | Freq: Once | INTRAVENOUS | Status: AC
  Administered 2019-02-27: 10:00:00 200 mg via INTRAVENOUS
  Filled 2019-02-27: qty 10

## 2019-02-27 MED ORDER — NON FORMULARY: 200.0000 mg | Freq: Once | Status: DC

## 2019-02-27 NOTE — Discharge Instructions (Signed)

## 2019-02-27 NOTE — Progress Notes (Signed)
PATIENT CARE CENTER NOTE  Diagnosis:  Iron Deficiency Anemia    Provider: Latanya Presser, MD   Procedure: Venofer infusion   Note: Patient received 200 mg Venofer infusion via PIV. Tolerated well with no adverse reaction. Vital signs stable. Discharge instructions given. Patient alert, oriented and ambulatory at discharge.

## 2019-03-08 ENCOUNTER — Other Ambulatory Visit (HOSPITAL_COMMUNITY): Payer: Self-pay | Admitting: Cardiology

## 2019-03-08 ENCOUNTER — Other Ambulatory Visit (HOSPITAL_COMMUNITY)
Admission: RE | Admit: 2019-03-08 | Discharge: 2019-03-08 | Disposition: A | Discharge status: Home / Self Care | Payer: Medicare Other | Source: Ambulatory Visit | Attending: Pulmonary Disease | Admitting: Pulmonary Disease

## 2019-03-08 DIAGNOSIS — Z20822 Contact with and (suspected) exposure to covid-19: Secondary | ICD-10-CM | POA: Diagnosis not present

## 2019-03-08 DIAGNOSIS — Z01812 Encounter for preprocedural laboratory examination: Secondary | ICD-10-CM | POA: Insufficient documentation

## 2019-03-08 LAB — SARS CORONAVIRUS 2 (TAT 6-24 HRS): SARS Coronavirus 2: NEGATIVE

## 2019-03-10 NOTE — Progress Notes (Signed)
@Patient  ID: Jillian Eaton, female    DOB: 02-09-1951, 69 y.o.   MRN: [id number]  Chief Complaint  Patient presents with  . Follow-up    F/U after PFT. States breathing has improved since last visit.     Referring provider: [name], MD  HPI:  69 year old female never smoker been evaluated our office for asthma  PMH: Obesity, hypertension, A. fib, chronic pain syndrome, fibromyalgia, GERD, depression, complex medical needs, status post bariatric surgery, Smoker/ Smoking History: Never smoker Maintenance: Arnuity 100 Pt of: Dr. Halford Chessman  03/11/2019  - Visit   69 year old female never smoker presenting to our office after completing pulmonary function test.  Patient feels that her breathing is back to baseline.  She is tolerating the Arnuity Ellipta inhaler well.  Pulmonary function testing results listed below:  03/11/2019-pulmonary function test-FVC 1.73 (61% predicted), postbronchodilator ratio 83, postbronchodilator FEV1 1.44 (65% predicted), positive bronchodilator response in FEV1, reversibility with ratio as well as mid flow reversibility, DLCO 15.87 (72% predicted)  Tests:   Pulmonary tests:  PFT 10/19/15 >> FEV1 1.38 (60%), FEV1% 82, TLC 3.34 (60%), DLCO 74%, +BD ABG 07/08/16 >> pH 7.38, PCO2 51.9, PO2 70  Chest imaging:  CT angio chest 12/06/18 >> enlarged PA, CM, mosaic attenuation with GGO RUL  Sleep tests:   02/05/2019-overnight oximetry on room air-duration of sleep 5 hours and 53 minutes, time spent below 88% 21 minutes, time spent below 89% 33.3 minutes, SaO2 low 75%   FENO:  No results found for: NITRICOXIDE  PFT: PFT Results Latest Ref Rng & Units 03/11/2019 10/19/2015  FVC-Pre L 1.73 1.54  FVC-Predicted Pre % 61 52  FVC-Post L 1.74 1.68  FVC-Predicted Post % 61 57  Pre FEV1/FVC % % 70 74  Post FEV1/FCV % % 83 82  FEV1-Pre L 1.21 1.14  FEV1-Predicted Pre % 54 50  FEV1-Post L 1.44 1.38  DLCO UNC% % 72 74  DLCO COR %Predicted % 105 109   TLC L 3.97 -  TLC % Predicted % 72 -  RV % Predicted % 92 -    WALK:  No flowsheet data found.  Imaging: No results found.  Lab Results:  CBC    Component Value Date/Time   WBC 4.5 02/11/2019 0934   RBC 3.07 (L) 02/11/2019 0934   HGB 8.5 (L) 02/11/2019 0934   HGB 7.5 (L) 11/20/2018 1548   HGB 11.2 (L) 10/25/2012 1431   HCT 28.6 (L) 02/11/2019 0934   HCT 23.4 (L) 11/20/2018 1548   HCT 34.8 10/25/2012 1431   PLT 201 02/11/2019 0934   PLT 193 11/20/2018 1548   MCV 93.2 02/11/2019 0934   MCV 95 11/20/2018 1548   MCV 94.1 10/25/2012 1431   MCH 27.7 02/11/2019 0934   MCHC 29.7 (L) 02/11/2019 0934   RDW 14.6 02/11/2019 0934   RDW 13.0 11/20/2018 1548   RDW 15.5 (H) 10/25/2012 1431   LYMPHSABS 1.3 11/27/2018 1235   LYMPHSABS 1.2 10/25/2012 1431   MONOABS 0.7 11/27/2018 1235   MONOABS 0.6 10/25/2012 1431   EOSABS 0.7 (H) 11/27/2018 1235   EOSABS 0.2 10/25/2012 1431   BASOSABS 0.1 11/27/2018 1235   BASOSABS 0.0 10/25/2012 1431    BMET    Component Value Date/Time   NA 141 02/11/2019 0934   NA 141 11/20/2018 1548   K 4.1 02/11/2019 0934   CL 103 02/11/2019 0934   CO2 29 02/11/2019 0934   GLUCOSE 78 02/11/2019 0934   BUN 24 (H) 02/11/2019  0934   BUN 21 11/20/2018 1548   CREATININE 0.92 02/11/2019 0934   CREATININE 1.01 (H) 11/27/2017 1029   CALCIUM 7.8 (L) 02/11/2019 0934   CALCIUM 5.5 (LL) 07/10/2017 2104   GFRNONAA >60 02/11/2019 0934   GFRNONAA 58 (L) 11/27/2017 1029   GFRAA >60 02/11/2019 0934   GFRAA 68 11/27/2017 1029    BNP    Component Value Date/Time   BNP 182.3 (H) 12/06/2018 1934    ProBNP    Component Value Date/Time   PROBNP 2,308 (H) 06/19/2017 1222   PROBNP 699.5 (H) 08/26/2010 1315    Specialty Problems      Pulmonary Problems   Mild intermittent asthma with acute exacerbation   Chronic asthmatic bronchitis (HCC)   Cough   Acute on chronic respiratory failure with hypoxia and hypercapnia (HCC)   Unilateral vocal cord paralysis    Vocal cord dysfunction   Oropharyngeal dysphagia   Chronic respiratory failure with hypoxia (HCC)   Acute on chronic respiratory failure with hypoxia (HCC)   Asthma      Allergies  Allergen Reactions  . Hydrocodone Itching    Can tolerate with benadryl      Immunization History  Administered Date(s) Administered  . Fluad Quad(high Dose 65+) 12/08/2018  . Influenza,inj,quad, With Preservative 02/22/2016, 03/05/2017  . Influenza-Unspecified 11/19/2014, 03/21/2016  . Pneumococcal Polysaccharide-23 05/01/2016    Past Medical History:  Diagnosis Date  . Anemia   . Asthma    flare up last yr  . CHF (congestive heart failure) ([place])   . Chronic headache    " better now "   . DOE (dyspnea on exertion)    2D ECHO, 02/12/2012 - EF 60-65%, moderate concentric hypertrophy  . Dysrhythmia   . Fibromyalgia    nerve pain"left side at waist level" "can't lay on that side without pain" , "HOB elevation helps"; 9-17- since i lost the wieght , i dont  think i have this anymore   . Heart murmur   . Hematuria - cause not known    resolved   . History of kidney stones    x 2 '13, '14 surgery to remove  . Hypertension   . Pneumonia   . SBO (small bowel obstruction) ([place]) 06/07/2013  . SBO (small bowel obstruction) ([place])    rqueired admission 2020  . Thyroid disease    "goiter"  . Transfusion history    10 yrs+    Tobacco History: Social History   Tobacco Use  Smoking Status Never Smoker  Smokeless Tobacco Never Used   Counseling given: Not Answered   Continue to not smoke  Outpatient Encounter Medications as of 03/11/2019  Medication Sig  . acetaminophen (TYLENOL) 650 MG CR tablet Take 650-1,300 mg by mouth every 8 (eight) hours as needed for pain.   . calcitRIOL (ROCALTROL) 0.25 MCG capsule Take 1 capsule (0.25 mcg total) by mouth 2 (two) times daily.  . clotrimazole (LOTRIMIN) 1 % cream APPLY TO AFFECTED AREA TWICE A DAY  . cyanocobalamin (,VITAMIN B-12,) 1000 MCG/ML  injection Inject 1 mL (1,000 mcg total) into the muscle every 30 (thirty) days.  . cyclobenzaprine (FLEXERIL) 10 MG tablet Take 2 tablets (20 mg total) by mouth 2 (two) times daily. (Patient taking differently: Take 10 mg by mouth 3 (three) times daily as needed for muscle spasms. )  . diclofenac Sodium (VOLTAREN) 1 % GEL Apply 1 application topically 4 (four) times daily as needed (pain.).   Marland Kitchen DULoxetine (CYMBALTA) 60 MG capsule  Take 1 capsule (60 mg total) by mouth daily.  Marland Kitchen ELIQUIS 5 MG TABS tablet TAKE 1 TABLET TWICE A DAY  . eszopiclone (LUNESTA) 2 MG TABS tablet Take 1 tablet (2 mg total) by mouth at bedtime.  . furosemide (LASIX) 20 MG tablet Take 1 tablet (20 mg total) by mouth as needed. (Patient taking differently: Take 20 mg by mouth daily. )  . levETIRAcetam (KEPPRA) 500 MG tablet Take 1 tablet (500 mg total) by mouth 2 (two) times daily.  Marland Kitchen levothyroxine (SYNTHROID) 150 MCG tablet Take 1 tablet (150 mcg total) by mouth daily. (Patient taking differently: Take 150 mcg by mouth daily before breakfast. )  . magnesium oxide (MAG-OX) 400 MG tablet Take 1 tablet (400 mg total) by mouth daily.  . metoprolol succinate (TOPROL-XL) 25 MG 24 hr tablet Take 25 mg by mouth 2 (two) times daily.  . mirabegron ER (MYRBETRIQ) 50 MG TB24 tablet Take 1 tablet (50 mg total) by mouth daily.  . NONFORMULARY OR COMPOUNDED ITEM [facility]:  Antifungal Cream - Terbinafine 3%, Fluconazole 2%, Tea Tree Oil 5%, Urea 10%, Ibuprofen 2% in DMSO Suspension #46ml. Apply to affected toenail(s) once daily (at bedtime) or twice daily. (Patient taking differently: Apply 1 application topically at bedtime. [facility]:  Antifungal Cream - Terbinafine 3%, Fluconazole 2%, Tea Tree Oil 5%, Urea 10%, Ibuprofen 2% in DMSO Suspension #79ml. Apply to affected toenail(s) once daily (at bedtime) or twice daily.)  . polyethylene glycol (MIRALAX / GLYCOLAX) 17 g packet Take 17 g by mouth daily as needed for moderate  constipation.  . traMADol (ULTRAM) 50 MG tablet Take 50 mg by mouth every 6 (six) hours as needed (pain).   . traZODone (DESYREL) 50 MG tablet Take 1 tablet (50 mg total) by mouth at bedtime.  . Vitamin D, Ergocalciferol, (DRISDOL) 1.25 MG (50000 UT) CAPS capsule Take 1 capsule (50,000 Units total) by mouth every Wednesday.  . [DISCONTINUED] Fluticasone Furoate (ARNUITY ELLIPTA) 100 MCG/ACT AEPB Inhale 1 puff into the lungs daily.  Marland Kitchen albuterol (VENTOLIN HFA) 108 (90 Base) MCG/ACT inhaler Inhale 2 puffs into the lungs every 6 (six) hours as needed for wheezing or shortness of breath.  . fluticasone furoate-vilanterol (BREO ELLIPTA) 100-25 MCG/INH AEPB Inhale 1 puff into the lungs daily.  . [DISCONTINUED] hyoscyamine (LEVSIN SL) 0.125 MG SL tablet Place 1 tablet (0.125 mg total) under the tongue every 4 (four) hours as needed for cramping (abdominal pain). (Patient not taking: Reported on 02/04/2019)  . [DISCONTINUED] ondansetron (ZOFRAN) 4 MG tablet Take 1 tablet (4 mg total) by mouth every 6 (six) hours as needed for nausea or vomiting. (Patient not taking: Reported on 02/04/2019)  . [DISCONTINUED] Polyethyl Glycol-Propyl Glycol (LUBRICANT EYE DROPS) 0.4-0.3 % SOLN Place 1 drop into both eyes 3 (three) times daily as needed (dry/irritated eyes.).   No facility-administered encounter medications on file as of 03/11/2019.     Review of Systems  Review of Systems  Constitutional: Negative for activity change, fatigue and fever.  HENT: Negative for sinus pressure, sinus pain and sore throat.   Respiratory: Positive for shortness of breath. Negative for cough and wheezing.   Cardiovascular: Negative for chest pain and palpitations.  Gastrointestinal: Negative for diarrhea, nausea and vomiting.  Musculoskeletal: Negative for arthralgias.  Neurological: Negative for dizziness.  Psychiatric/Behavioral: Negative for sleep disturbance. The patient is not nervous/anxious.      Physical Exam  BP  124/72   Pulse 78   Temp (!) 97 F (36.1 C) (  Temporal)   Ht 5\' 7"  (1.702 m)   Wt 231 lb 3.2 oz (104.9 kg)   SpO2 96%   BMI 36.21 kg/m   Wt Readings from Last 5 Encounters:  03/11/19 231 lb 3.2 oz (104.9 kg)  02/11/19 233 lb (105.7 kg)  02/10/19 229 lb (103.9 kg)  01/24/19 229 lb (103.9 kg)  01/09/19 223 lb (101.2 kg)    BMI Readings from Last 5 Encounters:  03/11/19 36.21 kg/m  02/11/19 37.04 kg/m  02/10/19 36.96 kg/m  01/24/19 36.96 kg/m  01/09/19 35.63 kg/m     Physical Exam Vitals and nursing note reviewed.  Constitutional:      General: She is not in acute distress.    Appearance: She is obese.  HENT:     Head: Normocephalic and atraumatic.     Right Ear: Tympanic membrane, ear canal and external ear normal. There is no impacted cerumen.     Left Ear: Tympanic membrane, ear canal and external ear normal. There is no impacted cerumen.     Nose: Rhinorrhea present. No congestion.     Mouth/Throat:     Mouth: Mucous membranes are moist.     Pharynx: Oropharynx is clear.     Comments: Postnasal drip Eyes:     Pupils: Pupils are equal, round, and reactive to light.  Cardiovascular:     Rate and Rhythm: Normal rate and regular rhythm.     Pulses: Normal pulses.     Heart sounds: No murmur.  Pulmonary:     Effort: Pulmonary effort is normal. No respiratory distress.     Breath sounds: Normal breath sounds. No decreased air movement. No decreased breath sounds, wheezing or rales.  Musculoskeletal:     Cervical back: Normal range of motion.  Skin:    General: Skin is warm and dry.     Capillary Refill: Capillary refill takes less than 2 seconds.  Neurological:     General: No focal deficit present.     Mental Status: She is alert and oriented to person, place, and time. Mental status is at baseline.     Gait: Gait normal.  Psychiatric:        Mood and Affect: Mood normal.        Behavior: Behavior normal.        Thought Content: Thought content normal.         Judgment: Judgment normal.       Assessment & Plan:   Obesity (BMI 30-39.9) Status post bariatric surgery  Plan: Encourage patient to continue to work on increasing physical activity Continue to work to reduce BMI and increase muscle mass  Medication management Plan: Trial of Breo Ellipta 100 Stop [place] [facility] help with [facility] cost  Asthma Plan: Trial of Breo Ellipta 100 inhaler Rescue inhaler prescribed today Hold Arnuity Ellipta at this time [facility] to help with medication cost Continue to work on increasing physical activity Follow-up with our office in 2 months     Return in about 2 months (around 05/09/2019), or if symptoms worsen or fail to improve, for Follow up with Dr. Halford Chessman.   Lauraine Rinne, NP 03/11/2019   This appointment required 42 minutes of patient care (this includes precharting, chart review, review of results, face-to-face care, etc.).

## 2019-03-11 ENCOUNTER — Encounter: Payer: Self-pay | Admitting: Pulmonary Disease

## 2019-03-11 ENCOUNTER — Ambulatory Visit (INDEPENDENT_AMBULATORY_CARE_PROVIDER_SITE_OTHER): Payer: Medicare Other | Admitting: Pulmonary Disease

## 2019-03-11 ENCOUNTER — Other Ambulatory Visit: Payer: Self-pay

## 2019-03-11 VITALS — BP 124/72 | HR 78 | Temp 97.0°F | Ht 67.0 in | Wt 231.2 lb

## 2019-03-11 DIAGNOSIS — E669 Obesity, unspecified: Secondary | ICD-10-CM

## 2019-03-11 DIAGNOSIS — J4540 Moderate persistent asthma, uncomplicated: Secondary | ICD-10-CM

## 2019-03-11 DIAGNOSIS — Z79899 Other long term (current) drug therapy: Secondary | ICD-10-CM | POA: Insufficient documentation

## 2019-03-11 DIAGNOSIS — R0609 Other forms of dyspnea: Secondary | ICD-10-CM

## 2019-03-11 LAB — PULMONARY FUNCTION TEST
DL/VA % pred: 105 %
DL/VA: 4.31 ml/min/mmHg/L
DLCO unc % pred: 72 %
DLCO unc: 15.87 ml/min/mmHg
FEF 25-75 Post: 1.48 L/sec
FEF 25-75 Pre: 0.75 L/sec
FEF2575-%Change-Post: 96 %
FEF2575-%Pred-Post: 71 %
FEF2575-%Pred-Pre: 36 %
FEV1-%Change-Post: 19 %
FEV1-%Pred-Post: 65 %
FEV1-%Pred-Pre: 54 %
FEV1-Post: 1.44 L
FEV1-Pre: 1.21 L
FEV1FVC-%Change-Post: 19 %
FEV1FVC-%Pred-Pre: 89 %
FEV6-%Change-Post: 1 %
FEV6-%Pred-Post: 63 %
FEV6-%Pred-Pre: 62 %
FEV6-Post: 1.74 L
FEV6-Pre: 1.72 L
FEV6FVC-%Change-Post: 0 %
FEV6FVC-%Pred-Post: 103 %
FEV6FVC-%Pred-Pre: 103 %
FVC-%Change-Post: 0 %
FVC-%Pred-Post: 61 %
FVC-%Pred-Pre: 61 %
FVC-Post: 1.74 L
FVC-Pre: 1.73 L
Post FEV1/FVC ratio: 83 %
Post FEV6/FVC ratio: 100 %
Pre FEV1/FVC ratio: 70 %
Pre FEV6/FVC Ratio: 100 %
RV % pred: 92 %
RV: 2.1 L
TLC % pred: 72 %
TLC: 3.97 L

## 2019-03-11 MED ORDER — ALBUTEROL SULFATE HFA 108 (90 BASE) MCG/ACT IN AERS: 2.0000 | INHALATION_SPRAY | Freq: Four times a day (QID) | RESPIRATORY_TRACT | 4 refills | Status: DC | PRN

## 2019-03-11 MED ORDER — BREO ELLIPTA 100-25 MCG/INH IN AEPB: 1.0000 | INHALATION_SPRAY | Freq: Every day | RESPIRATORY_TRACT | 0 refills | Status: DC

## 2019-03-11 NOTE — Patient Instructions (Addendum)
You were seen today by Lauraine Rinne, NP  for:   1. Moderate persistent asthma without complication  - AMB Referral to [place] [facility]  Trial of Breo Ellipta 100 >>> Take 1 puff daily in the morning right when you wake up >>>Rinse your mouth out after use >>>This is a daily maintenance inhaler, NOT a rescue inhaler >>>Contact our office if you are having difficulties affording or obtaining this medication >>>It is important for you to be able to take this daily and not miss any doses  Hold / Stop Arnuity Ellipta 100 >>> Take 1 puff daily in the morning right when you wake up >>>Rinse your mouth out after use >>>This is a daily maintenance inhaler, NOT a rescue inhaler >>>Contact our office if you are having difficulties affording or obtaining this medication >>>It is important for you to be able to take this daily and not miss any doses   Only use your albuterol as a rescue medication to be used if you can't catch your breath by resting or doing a relaxed purse lip breathing pattern.  - The less you use it, the better it will work when you need it. - Ok to use up to 2 puffs  every 4 hours if you must but call for immediate appointment if use goes up over your usual need - Don't leave home without it !!  (think of it like the spare tire for your car)    2. Medication management  [facility] to [facility] overall cost of medications  Trial of Breo Ellipta 100  3. Obesity (BMI 30-39.9)  Continue to work on increasing your overall physical activity every day  [place] job on your progress   We recommend today:  Orders Placed This Encounter  Procedures  . AMB Referral to [place] Management    Referral Priority:   Routine    Referral Type:   Consultation    Referral Reason:   [facility]    Number of Visits Requested:   1   Orders Placed This Encounter  Procedures  . AMB Referral to [place] [facility]   No orders of the defined types were  placed in this encounter.   Follow Up:    Return in about 2 months (around 05/09/2019), or if symptoms worsen or fail to improve, for Follow up with Dr. Halford Chessman.   Please do your part to reduce the spread of COVID-19:      Reduce your risk of any infection  and COVID19 by using the similar precautions used for avoiding the common cold or flu:  Marland Kitchen Wash your hands often with soap and warm water for at least 20 seconds.  If soap and water are not readily available, use an alcohol-based hand sanitizer with at least 60% alcohol.  . If coughing or sneezing, cover your mouth and nose by coughing or sneezing into the elbow areas of your shirt or coat, into a tissue or into your sleeve (not your hands). Langley Gauss A MASK when in public  . Avoid shaking hands with others and consider head nods or verbal greetings only. . Avoid touching your eyes, nose, or mouth with unwashed hands.  . Avoid close contact with people who are sick. . Avoid places or events with large numbers of people in one location, like concerts or sporting events. . If you have some symptoms but not all symptoms, continue to monitor at home and seek medical attention if your symptoms  worsen. . If you are having a medical emergency, call 911.   [place] / e-Visit: [email or web address]         [facility]: [facility]: [id number]                   [facility]: [id number]     It is flu season:   >>> Best ways to protect herself from the flu: Receive the yearly flu vaccine, practice good hand hygiene washing with soap and also using hand sanitizer when available, eat a nutritious meals, get adequate rest, hydrate appropriately   Please contact the office if your symptoms worsen or you have concerns that you are not improving.   Thank you for choosing [facility]  for your healthcare, and for allowing [place] to partner with you on your healthcare journey. I am thankful to be able to provide care to you today.   Wyn Quaker FNP-C

## 2019-03-11 NOTE — Assessment & Plan Note (Signed)
Status post bariatric surgery  Plan: Encourage patient to continue to work on increasing physical activity Continue to work to reduce BMI and increase muscle mass

## 2019-03-11 NOTE — Assessment & Plan Note (Signed)
Plan: Trial of Breo Ellipta 100 Stop Arnuity Ellipta [facility] help with medication cost

## 2019-03-11 NOTE — Assessment & Plan Note (Signed)
Plan: Trial of Breo Ellipta 100 inhaler Rescue inhaler prescribed today Hold Arnuity Ellipta at this time [facility] to help with medication cost Continue to work on increasing physical activity Follow-up with our office in 2 months

## 2019-03-11 NOTE — Progress Notes (Signed)
PFT done today. 

## 2019-03-11 NOTE — Progress Notes (Signed)
Reviewed and agree with assessment/plan.   Aubryana Vittorio, MD [facility] 02/16/2016, 12:24 PM Pager:  [phone]  

## 2019-03-12 ENCOUNTER — Telehealth: Payer: Self-pay | Admitting: Pharmacist

## 2019-03-12 ENCOUNTER — Other Ambulatory Visit: Payer: Self-pay | Admitting: *Deleted

## 2019-03-12 NOTE — Patient Outreach (Signed)
[facility]) Care Management  03/12/2019  Junnie Kemme [facility] 1950-05-28 [id number]   Referral Received 1/19 Initial Outreach 1/20    Telephone Assessment  RN spoke with pt today and introduced the [facility] program and available services. Discuss the recent referral and inquired further on pt's needs. Pt states she needs assistance with an asthma medication called (Arnuity) that works better for her asthma. States she is current on a different medication due to the cost but was adamant about this medication working better for her instead of the current medication  Breo. Pt is requested any assistance program available to assist. RN offered to make a referral to [place] to assist further on this request.  RN inquired further on pt's ongoing medical issues as pt further discussed how she is managing her Atrial Fibrillation and HF. RN further educated on both medical conditions with an offer to mail printed material however pt indicated she is aware of the HF zones and usually in the GREEN zone other then ocassional swelling history to her LE. Pt takes 20 mg Lasix regular however with any retention she administered 40 mg to reduce to her baseline related to her weights. Takes metoprolol for her BP and Atrial Fibrillation along with monitoring from her kidney provider. RN offered enrollment however pt feels all her medical condition are being monitored very closely along with her knowledge base on when to reach out to her provider with any acute encounters. Only compliant is some SOB however pt aware this is related to her HF and not her asthma. Pt able to verify her regular daily maintain inhaler and her rescue inhaler is used when needed.  No additional needs as a referral for [place] will be submitted for assistance with her Arnuity This discipline will be closed to allow pharmacy to assist further.  Raina Mina, RN Care Management Coordinator [place] Office [phone]

## 2019-03-12 NOTE — Patient Outreach (Signed)
[facility]) Care Management  [place]   03/12/2019  Brittony Koelker [facility] 1950/03/05 [id number]  Reason for referral: medication assistance     Referral source: [facility] Nurse Referral medication(s): Breo Current insurance:[employer] [facility] [employer](commercial)  PMHx:  CHF, CKD stage III, Asthma, A Fib, Hypertension, Fibromyalgia, Osteoarthritis, GERD, Hypothyroidism, Osteoporosis, Overactive bladder, pulmonary hypertension,   HPI:  Patient was called regarding medication assistance per referral. HIPAA identifiers were obtained. Patient said when she had Arnuity filled last year it had a copay of >$100 for one month supply and that was cost prohibitive for her. She was changed to [facility] and given samples but wondered what could be done to help with the cost.   Objective: Allergies  Allergen Reactions  . Hydrocodone Itching         Medications Reviewed Today    Reviewed by Elayne Guerin, [facility] (Pharmacist) on 03/12/19 at 1545  Med List Status: <None>  Medication Order Taking? Sig Documenting Provider Last Dose Status Informant  acetaminophen (TYLENOL) 650 MG CR tablet [id number] Yes Take 650-1,300 mg by mouth every 8 (eight) hours as needed for pain.  [provider] Taking Active Self  albuterol (VENTOLIN HFA) 108 (90 Base) MCG/ACT inhaler [id number] Yes Inhale 2 puffs into the lungs every 6 (six) hours as needed for wheezing or shortness of breath. Lauraine Rinne, NP Taking Active   calcitRIOL (ROCALTROL) 0.25 MCG capsule [id number] Yes Take 1 capsule (0.25 mcg total) by mouth 2 (two) times daily. Philemon Kingdom, MD Taking Active Self  clotrimazole (LOTRIMIN) 1 % cream [id number] Yes APPLY TO AFFECTED AREA TWICE A DAY Marzetta Board, DPM Taking Active   cyanocobalamin (,VITAMIN B-12,) 1000 MCG/ML injection [id number] Yes Inject 1 mL (1,000 mcg total) into the muscle every 30 (thirty) days. Hennie Duos, MD Taking Active Self   cyclobenzaprine (FLEXERIL) 10 MG tablet [id number] Yes Take 2 tablets (20 mg total) by mouth 2 (two) times daily.  Patient taking differently: Take 10 mg by mouth 3 (three) times daily as needed for muscle spasms.    Hennie Duos, MD Taking Active Self  diclofenac Sodium (VOLTAREN) 1 % [id number] Yes Apply 1 application topically 4 (four) times daily as needed (pain.).  [provider] Taking Active Self           Med Note Peggye Pitt T   Tue Feb 04, 2019  2:44 PM) On hand  DULoxetine (CYMBALTA) 60 MG capsule [id number] Yes Take 1 capsule (60 mg total) by mouth daily. Hennie Duos, MD Taking Active Self  ELIQUIS 5 MG TABS tablet [id number] Yes TAKE 1 TABLET TWICE A DAY Hilty, Nadean Corwin, MD Taking Active   eszopiclone (LUNESTA) 2 MG TABS tablet [id number] Yes Take 1 tablet (2 mg total) by mouth at bedtime. Hennie Duos, MD Taking Active Self  fluticasone furoate-vilanterol (BREO ELLIPTA) 100-25 MCG/INH AEPB [id number] No Inhale 1 puff into the lungs daily.  Patient not taking: Reported on 03/12/2019   Lauraine Rinne, NP Not Taking Active   furosemide (LASIX) 20 MG tablet [id number] Yes Take 1 tablet (20 mg total) by mouth as needed.  Patient taking differently: Take 20 mg by mouth daily.    Pixie Casino, MD Taking Active Self  iron dextran complex (INFED) 50 MG/ML injection [id number] Yes Infed 50 mg/mL injection solution  Inject 2 mL by intravenous route for 1 day. [provider] Taking Active   levETIRAcetam (KEPPRA) 500 MG tablet  [id number] Yes Take 1 tablet (500 mg total) by mouth 2 (two) times daily. Suzzanne Cloud, NP Taking Active Self  levothyroxine (SYNTHROID) 150 MCG tablet [id number] Yes Take 1 tablet (150 mcg total) by mouth daily.  Patient taking differently: Take 150 mcg by mouth daily before breakfast.    Philemon Kingdom, MD Taking Active Self  magnesium oxide (MAG-OX) 400 MG tablet [id number] Yes Take 1 tablet (400 mg total) by mouth daily.  Hennie Duos, MD Taking Active Self  metoprolol succinate (TOPROL-XL) 25 MG 24 hr tablet [id number] Yes Take 25 mg by mouth 2 (two) times daily. [provider] Taking Active Self  mirabegron ER (MYRBETRIQ) 50 MG TB24 tablet [id number] No Take 1 tablet (50 mg total) by mouth daily.  Patient not taking: Reported on 03/12/2019   Hennie Duos, MD Not Taking Active Self  NONFORMULARY OR COMPOUNDED ITEM [id number] Yes [facility]:  Antifungal Cream - Terbinafine 3%, Fluconazole 2%, Tea Tree Oil 5%, Urea 10%, Ibuprofen 2% in DMSO Suspension #52ml. Apply to affected toenail(s) once daily (at bedtime) or twice daily.  Patient taking differently: Apply 1 application topically at bedtime. [facility]:  Antifungal Cream - Terbinafine 3%, Fluconazole 2%, Tea Tree Oil 5%, Urea 10%, Ibuprofen 2% in DMSO Suspension #76ml. Apply to affected toenail(s) once daily (at bedtime) or twice daily.   Marzetta Board, DPM Taking Active Self           Med Note Duffy Bruce, Legrand Como   Sun May 26, 2018  3:11 PM)    polyethylene glycol (MIRALAX / GLYCOLAX) 17 g packet [id number] Yes Take 17 g by mouth daily as needed for moderate constipation. Orpah Greek, MD Taking Active Self  traMADol (ULTRAM) 50 MG tablet [id number] Yes Take 50 mg by mouth every 6 (six) hours as needed (pain).  [provider] Taking Active Self  traZODone (DESYREL) 50 MG tablet [id number] Yes Take 1 tablet (50 mg total) by mouth at bedtime. Hennie Duos, MD Taking Active Self  Vitamin D, Ergocalciferol, (DRISDOL) 1.25 MG (50000 UT) CAPS capsule [id number] Yes Take 1 capsule (50,000 Units total) by mouth every Wednesday. Hennie Duos, MD Taking Active Self  Med List Note Dessie Coma, CPhT 05/26/18 1621): Essie Christine (daughter) [phone]          Assessment:  Drugs sorted by system:  Neurologic/Psychologic: Duloxetine, Lunesta, Levetiracetam, Trazodone  Cardiovascular: Eliquis, Furosemide,  Metoprolol Succinate  Pulmonary/Allergy: Albuterol HFA, Breo Ellipta   Endocrine: Levothyroxine  Renal: Calcitriol   Topical: Clotrimazole, Diclofenac gel 1%, Compounded antifungal cream (Terbinafine, Fluconazaole, Tea tree oil, urea, ibuprofen)  Pain: Acetaminophen, Cyclobenzaprine,   Vitamins/Minerals/Supplements: Cyanocobalamin, Iron Dextran (injection), Magnesium Oxide, Ergocalciferol   Miscellaneous: Myrbetriq  Medication Review Findings:  . Risk of CNS depression: tramadol, eszopiclone, trazodone with combination  . Patient said she is not taking Myrbetriq right now due to needing to take furosemide to get rid of her excess fluid.  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 As such, she is not eligible for patient assistane programs. However, her insurance was called to inquire aobut the pricing of Breo Ellipta.  [employer] and spoke with a representative who said [facility] would cost $75 through mail order or [facility] the local [facility] for a 90 day supply.  Patient was under the impression she could only get a 30 day supply through the local pharmacy.  Patient was pleased with the  copay as it was much cheaper than what she was quoted with Arnuity.  [employer] did not have any coupons for [employer] available. However, it is unclear if the patient could be able to use them since her insurance is Federally administered.    Additional medication assistance options reviewed with patient as warranted:  Mail order programs  Plan: Close patient's pharmacy case as her questions were answered. She was given my contact information to reach out if she had any medication related questions or concerns in the future.    Will route note to Wyn Quaker, NP.  Elayne Guerin, PharmD, [place] Clinical Pharmacist [phone]

## 2019-03-14 ENCOUNTER — Telehealth: Payer: Self-pay | Admitting: Pulmonary Disease

## 2019-03-14 MED ORDER — BREO ELLIPTA 100-25 MCG/INH IN AEPB: 1.0000 | INHALATION_SPRAY | Freq: Every day | RESPIRATORY_TRACT | 0 refills | Status: DC

## 2019-03-14 NOTE — Telephone Encounter (Signed)
03/14/2019 [id number]  Patient completed follow-up with [facility] pharmacy team.  They investigated patient's cost of inhalers.  Looks like patient would benefit from a prescription for Breo Ellipta 100 but a 90-day supply, to her mail order pharmacy.  Can we please contact the patient and see if she is okay with [place] going ahead and sending this prescription.  So that way this can start to be processed.  Need to confirm patient's preferred mail order pharmacy.  Okay to send Breo Ellipta 100 prescription 90-day supply with 3 refills.  Breo Ellipta 100 >>> Take 1 puff daily in the morning right when you wake up >>>Rinse your mouth out after use >>>This is a daily maintenance inhaler, NOT a rescue inhaler >>>Contact our office if you are having difficulties affording or obtaining this medication >>>It is important for you to be able to take this daily and not miss any doses   Will route to [facility] as FYI that our office will handle the script and close the loop of communication.   Wyn Quaker FNP

## 2019-03-14 NOTE — Telephone Encounter (Signed)
Spoke with patient. She stated that the Breo 100 has been working well for her. She wishes to use [facility] as her [facility].   Nothing further needed at time of call.

## 2019-03-18 ENCOUNTER — Ambulatory Visit (HOSPITAL_COMMUNITY)
Admission: RE | Admit: 2019-03-18 | Discharge: 2019-03-18 | Disposition: A | Discharge status: Home / Self Care | Payer: Medicare Other | Source: Ambulatory Visit | Attending: Internal Medicine | Admitting: Internal Medicine

## 2019-03-18 ENCOUNTER — Other Ambulatory Visit: Payer: Self-pay

## 2019-03-18 DIAGNOSIS — D509 Iron deficiency anemia, unspecified: Secondary | ICD-10-CM | POA: Diagnosis not present

## 2019-03-18 MED ORDER — SODIUM CHLORIDE 0.9 % IV SOLN: 25.0000 mg | Freq: Once | INTRAVENOUS | Status: DC

## 2019-03-18 MED ORDER — SODIUM CHLORIDE 0.9 % IV SOLN
INTRAVENOUS | Status: DC | PRN
  Administered 2019-03-18: 250 mL via INTRAVENOUS

## 2019-03-18 MED ORDER — SODIUM CHLORIDE 0.9 % IV SOLN: 100.0000 mg | Freq: Once | INTRAVENOUS | Status: DC

## 2019-03-18 MED ORDER — SODIUM CHLORIDE 0.9 % IV SOLN
75.0000 mg | Freq: Once | INTRAVENOUS | Status: AC
  Administered 2019-03-18: 75 mg via INTRAVENOUS
  Filled 2019-03-18: qty 1.5

## 2019-03-18 MED ORDER — SODIUM CHLORIDE 0.9 % IV SOLN: 75.0000 mg | Freq: Once | INTRAVENOUS | Status: DC

## 2019-03-18 MED ORDER — SODIUM CHLORIDE 0.9 % IV SOLN
25.0000 mg | Freq: Once | INTRAVENOUS | Status: AC
  Administered 2019-03-18: 25 mg via INTRAVENOUS
  Filled 2019-03-18: qty 0.5

## 2019-03-18 NOTE — Progress Notes (Signed)
PATIENT CARE CENTER NOTE  Diagnosis:  Iron Deficiency Anemia    Provider: Latanya Presser, MD   Procedure: IV Infed   Note: Patient received 100 mg Infed infusion via PIV. Test dose given and patient observed for 1 hour. No adverse reaction noted and full dose given. Tolerated well with no adverse reaction. Vital signs stable. Discharge instructions given. Patient alert, oriented and ambulatory at discharge.

## 2019-03-18 NOTE — Discharge Instructions (Signed)
Iron Dextran injection What is this medicine? IRON DEXTRAN (AHY ern DEX tran) is an iron complex. Iron is used to make healthy red blood cells, which carry oxygen and nutrients through the body. This medicine is used to treat people who cannot take iron by mouth and have low levels of iron in the blood. This medicine may be used for other purposes; ask your health care provider or pharmacist if you have questions. COMMON BRAND NAME(S): Dexferrum, INFeD What should I tell my health care provider before I take this medicine? They need to know if you have any of these conditions:  anemia not caused by low iron levels  heart disease  high levels of iron in the blood  kidney disease  liver disease  an unusual or allergic reaction to iron, other medicines, foods, dyes, or preservatives  pregnant or trying to get pregnant  breast-feeding How should I use this medicine? This medicine is for injection into a vein or a muscle. It is given by a health care professional in a hospital or clinic setting. Talk to your pediatrician regarding the use of this medicine in children. While this drug may be prescribed for children as young as 4 months old for selected conditions, precautions do apply. Overdosage: If you think you have taken too much of this medicine contact a poison control center or emergency room at once. NOTE: This medicine is only for you. Do not share this medicine with others. What if I miss a dose? It is important not to miss your dose. Call your doctor or health care professional if you are unable to keep an appointment. What may interact with this medicine? Do not take this medicine with any of the following medications:  deferoxamine  dimercaprol  other iron products This medicine may also interact with the following medications:  chloramphenicol  deferasirox This list may not describe all possible interactions. Give your health care provider a list of all the  medicines, herbs, non-prescription drugs, or dietary supplements you use. Also tell them if you smoke, drink alcohol, or use illegal drugs. Some items may interact with your medicine. What should I watch for while using this medicine? Visit your doctor or health care professional regularly. Tell your doctor if your symptoms do not start to get better or if they get worse. You may need blood work done while you are taking this medicine. You may need to follow a special diet. Talk to your doctor. Foods that contain iron include: whole grains/cereals, dried fruits, beans, or peas, leafy green vegetables, and organ meats (liver, kidney). Long-term use of this medicine may increase your risk of some cancers. Talk to your doctor about how to limit your risk. What side effects may I notice from receiving this medicine? Side effects that you should report to your doctor or health care professional as soon as possible:  allergic reactions like skin rash, itching or hives, swelling of the face, lips, or tongue  blue lips, nails, or skin  breathing problems  changes in blood pressure  chest pain  confusion  fast, irregular heartbeat  feeling faint or lightheaded, falls  fever or chills  flushing, sweating, or hot feelings  joint or muscle aches or pains  pain, tingling, numbness in the hands or feet  seizures  unusually weak or tired Side effects that usually do not require medical attention (report to your doctor or health care professional if they continue or are bothersome):  change in taste (metallic taste)    diarrhea  headache  irritation at site where injected  nausea, vomiting  stomach upset This list may not describe all possible side effects. Call your doctor for medical advice about side effects. You may report side effects to FDA at 1-800-FDA-1088. Where should I keep my medicine? This drug is given in a hospital or clinic and will not be stored at home. NOTE: This  sheet is a summary. It may not cover all possible information. If you have questions about this medicine, talk to your doctor, pharmacist, or health care provider.  2020 Elsevier/Gold Standard (2007-06-25 16:59:50)  

## 2019-03-21 ENCOUNTER — Ambulatory Visit: Payer: Medicare Other

## 2019-03-21 DIAGNOSIS — Z23 Encounter for immunization: Secondary | ICD-10-CM | POA: Diagnosis not present

## 2019-03-29 ENCOUNTER — Ambulatory Visit: Payer: Medicare Other

## 2019-03-31 DIAGNOSIS — R635 Abnormal weight gain: Secondary | ICD-10-CM | POA: Diagnosis not present

## 2019-03-31 DIAGNOSIS — D519 Vitamin B12 deficiency anemia, unspecified: Secondary | ICD-10-CM | POA: Diagnosis not present

## 2019-03-31 DIAGNOSIS — R0609 Other forms of dyspnea: Secondary | ICD-10-CM | POA: Diagnosis not present

## 2019-03-31 DIAGNOSIS — I890 Lymphedema, not elsewhere classified: Secondary | ICD-10-CM | POA: Diagnosis not present

## 2019-03-31 DIAGNOSIS — N1832 Chronic kidney disease, stage 3b: Secondary | ICD-10-CM | POA: Diagnosis not present

## 2019-03-31 DIAGNOSIS — E890 Postprocedural hypothyroidism: Secondary | ICD-10-CM | POA: Diagnosis not present

## 2019-03-31 DIAGNOSIS — I4821 Permanent atrial fibrillation: Secondary | ICD-10-CM | POA: Diagnosis not present

## 2019-03-31 DIAGNOSIS — E538 Deficiency of other specified B group vitamins: Secondary | ICD-10-CM | POA: Diagnosis not present

## 2019-03-31 DIAGNOSIS — I10 Essential (primary) hypertension: Secondary | ICD-10-CM | POA: Diagnosis not present

## 2019-03-31 DIAGNOSIS — D509 Iron deficiency anemia, unspecified: Secondary | ICD-10-CM | POA: Diagnosis not present

## 2019-04-01 DIAGNOSIS — D519 Vitamin B12 deficiency anemia, unspecified: Secondary | ICD-10-CM | POA: Diagnosis not present

## 2019-04-01 DIAGNOSIS — D509 Iron deficiency anemia, unspecified: Secondary | ICD-10-CM | POA: Diagnosis not present

## 2019-04-01 DIAGNOSIS — E538 Deficiency of other specified B group vitamins: Secondary | ICD-10-CM | POA: Diagnosis not present

## 2019-04-02 ENCOUNTER — Ambulatory Visit (INDEPENDENT_AMBULATORY_CARE_PROVIDER_SITE_OTHER): Payer: Medicare Other | Admitting: Internal Medicine

## 2019-04-02 ENCOUNTER — Encounter: Payer: Self-pay | Admitting: Internal Medicine

## 2019-04-02 ENCOUNTER — Other Ambulatory Visit: Payer: Self-pay

## 2019-04-02 VITALS — BP 108/74 | HR 78 | Temp 93.4°F | Ht 67.0 in | Wt 229.2 lb

## 2019-04-02 DIAGNOSIS — I10 Essential (primary) hypertension: Secondary | ICD-10-CM | POA: Diagnosis not present

## 2019-04-02 DIAGNOSIS — I4819 Other persistent atrial fibrillation: Secondary | ICD-10-CM | POA: Diagnosis not present

## 2019-04-02 DIAGNOSIS — R0609 Other forms of dyspnea: Secondary | ICD-10-CM

## 2019-04-02 DIAGNOSIS — I5033 Acute on chronic diastolic (congestive) heart failure: Secondary | ICD-10-CM

## 2019-04-02 NOTE — Progress Notes (Signed)
OFFICE NOTE  Chief Complaint:  Follow-up dyspnea  Primary Care Physician: Jillian Hose, MD  HPI:  Jillian Eaton is a 69 year old female I saw a few weeks ago with a history of super morbid obesity, fibromyalgia and increasing shortness of breath. She had a cath in 2012 which showed normal coronaries, mildly elevating filling pressures, and diastolic pressures with a mean PA of 31. This correlated with her echocardiogram when RV systolic pressures in 5397 were 49 on echo. A repeat echocardiogram was just performed which demonstrated a preserved LVEF of 60-65% with moderate concentric LVH. There was mild to moderate increase in pulmonary artery pressure with peak at 51, which has not significantly changed from her study 1 year ago. Although the pressures look very similar her shortness of breath has been increasing significantly, and I recommended that she wear oxygen at night since she had that at home which does seem to be helping her at least feel better during the day as I suspect she has sleep apnea. She has been tested before which was apparently negative and is considering retesting at some point in the future. Overall I think the main issue obviously is weight, and she unfortunately says that she is not a candidate for gastric bypass and therefore it is a very difficult situation.  I referred her back to her pulmonologist in [facility] for ongoing evaluation of pulmonary hypertension. She tells me that she was then referred to [facility] and saw a specialist there who did another right heart catheterization but did not recommend any medications other than better blood pressure control.   In addition she had problems with kidney stones and underwent 2 operations regarding tthis.  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  She is now dependent on thyroid medication. Unfortunately she's not been able to lose weight, but her weight is fairly  stable around 400 pounds as is her shortness of breath.  Cath in 2012:  LEFT HEART CATHETERIZATION  OPERATOR: Jillian Kyesha Balla, MD, and Jillian Porter, MD  INDICATION: Dyspnea on exertion and chest pressure.  HISTORY OF PRESENT ILLNESS: Jillian Eaton is a morbidly obese (BMI greater than 63) female with a history of failed gastric bypass x2 with an increasing weight gain and increasing shortness of breath as well as new- onset dyspnea on exertion. She reports that she can only walk about 10 feet before she becomes short of breath and has chest pressure which she says get better with rest. She has numerous cardiac risk factors and given the high likelihood of false positive stress test, I have referred her for cardiac catheterization, both left and right heart as she has had elevated RVSP on echocardiogram of approximately 30 to 31 mmHg.  PROCEDURE: The patient was brought into the cardiac catheterization lab, sterilely prepped and draped in the usual fashion. The area around the right femoral artery and right brachial vein were cleansed and draped to allow an attempt at radial arterial and brachiocephalic venous access. IV was not able to be obtained prior to the procedure given her body habitus. With difficulty in assessing the vein, the ultrasound was eventually used to identify the right brachiocephalic vein, however, cannulation with needle and wire was not possible as the vein was very small in caliber. After mild local bleeding was controlled, we did turn our attention to the right femoral vein and with great difficulty using the ultrasound, the right femoral vein was accessed by Dr.  Ellyn Eaton using a straight wire and a needle. After venous access was obtained, the attention was turned to the right radial artery by Dr. Ellyn Eaton and simultaneous to that right heart catheterization was performed by myself without any difficulty. The right radial artery was  successfully cannulated and subsequently left coronary artery system was selectively injected with a 5-French TIG 4.0 catheter, however the right coronary artery could not be cannulated with the TIG catheter and was eventually cannulated with a JR-4 catheter. LV pressure was measured with a pigtail catheter. Estimated blood loss was about 30 mL. There were no acute complications. The patient received a total of 9 mg of Versed throughout the procedure as well as 225 mcg of fentanyl and was at no point greater than moderately sedated. She received 5000 units of heparin about 10 mL of a radial cocktail.  FINDINGS: 1. Left main - short, no disease. 2. LAD - no significant disease. 3. Left circumflex, no significant disease. 4. RCA - dominant, no disease, large-caliber vessel. 5. LVEDP = 20 mmHg. 6. RA - 12. 7. RV 38/12. 8. PA - 43/19 (31). 9. PCWP - 24. 10.TPG - 7. 11.Fick cardiac output/Fick cardiac index - 10.56/3.84. 12.Thermodilution cardiac output/thermodilution cardiac index -  6.78/2.47. 13.Aortic saturation - 94%. 14.PA saturation - 68%.  IMPRESSION: 1. No significant obstructive coronary artery disease. 2. LVEDP = 20 mmHg. 3. Borderline pulmonary venous hypertension. 4. High cardiac output.  Jillian Eaton returns today for follow-up. She reports that her shortness of breath has not significantly worsened, in fact, possibly is slightly better. She did have thyroid surgery last year at [facility] and apparently underwent right heart catheterization prior to that. I do not have those records immediately available. Unfortunately she continues to maintain her weight and has not been able to lose any. She is complaining of some numbness and tingling in her feet which is likely neuropathy. She is on medication including Lyrica which she takes for fibromyalgia.  Jillian Eaton returns today for follow-up. She recently is been having more shortness of breath  and lower extremity swelling. She pointed out edema in her legs with very small blisters and some chronic venous stasis changes. She is not currently on a diuretic. She recently saw another new primary care provider with the [facility]. She was noted to be started on lisinopril 40 mg daily, which she is taking in addition to losartan 100 mg daily. The notes do not indicate from her office visit why she was started on lisinopril, but I can see through care everywhere that this was ordered by her primary care provider. She also takes amlodipine for blood pressure control. Her blood pressure was elevated initially at 178/94, but after resting came down to 120/78 and is at goal today. It is unusual, however to use both ACE inhibitors and ARBs.  07/02/2015  Mrs. Peine returns today for follow-up. She underwent a repeat echocardiogram for progressive dyspnea and exertion which showed a preserved LVEF of 6065% however there is moderate to severe pulmonary hypertension with an RVSP of 64 mmHg. This is increased about 10 mmHg compared to her prior study. Her mean pulmonary pressure by catheterization in 2012 was only 31 mmHg. I suspect that progressive pulmonary hypertension as a cause of her worsening shortness of breath. In fact, during recent hospitalization for surgery she required discharged with oxygen. She says she rarely uses oxygen at home but notes that she is hypoxic often when she checks her oxygen  levels. At her last office visit I recommended Lasix which she has been using sparingly. She has problems with incontinence and she does report an improvement in her swelling with it but does not notice significant change in her shortness of breath.  06/06/2016  Mrs. Osterberg returns from hospital follow-up. She was just admitted after an admission in January for unintentional narcotic overdose. She had unresponsiveness and possible aspiration. There was a second admission for a similar presentation  with respiratory failure, fever, sepsis and ultimately she was found to have a new onset cardiomyopathy with EF as low as 25%. Was felt that this was nonischemic. She was having intermittent atrial tachycardia and was felt that this might need to be managed with antiarrhythmic therapy however it seems to have resolved with carvedilol. She was placed on diuretics and reports that her breathing is close to baseline. She remains hypoxic with an O2 saturation 93% however was on home oxygen is result of her severe pulmonary hypertension. She does not feel that she needs to use the oxygen very regularly. From a heart failure standpoint she is on carvedilol, aspirin, furosemide and not currently on an ACE inhibitor, ARB or Entresto. Recent testing a renal function shows normal creatinine.  10/11/2016  Mrs. Tawney is seen today in follow-up. In July she was hospitalized for hyperkalemia. She is on supplemental potassium and lisinopril, both were stopped. Fortunately her echo has improved back to normal recently. Overall she feels well. Her weight is down about 40 pounds of the recently she gained about 20 pounds back. She is working on that right as we speak.  04/03/2017  Mrs. Hogle was seen today in follow-up.  She recently called in and had complaints of worsening shortness of breath.  She saw her pulmonary doctor who did an x-ray noted a mild right upper lobe infiltrate versus edema.  Labs were obtained including BNP which was only mildly elevated at 110.  She was given 20 mg of Lasix with no benefit and then recently was advised over the phone to increase her Lasix to 40 mg for 2 days.  She urinated quite a bit and lost about 5 pounds of what she feels was water weight.  She reports mild improvement in her breathing.  She is quite anxious about what might have led to this fluid gain.  Given her history of cardiomyopathy, it is possible she could have had some recurrent cardiomyopathy.  She reports stable diet and  no worsening abuse of sodium.  06/19/2017  Mrs. Arp returns today for follow-up.  She was supposed to have a limited echocardiogram but that never happened.  Approximately 2 to 3 weeks ago she called the office reporting palpitations.  We try to schedule an earlier appointment, but she ended up going to [facility] for her [facility].  On the flight she apparently became short of breath had some left flank pain and was hypoxic.  She was given oxygen and after landing they took her to the [facility] in [place].  She was treated there and found to be in A. fib with RVR.  This is a new finding.  She had a remote history of either PAT or PAF, but so brief that she was not anticoagulated.  She was then started on Xarelto.  She tells me she was given Lasix and diuresed.  She returns today for follow-up and still reports some shortness of breath.  Weight is about 6 pounds heavier than she was 2 months ago.  She is in A. fib persistently with heart rates in the low 100s.  In addition she reports after starting the Xarelto that she has been having some vaginal bleeding.  07/26/2017  Mrs. Branaman returns today for follow-up.  She was recently seen in the hospital and discharged on 07/17/2017.  Today is a transition of care follow-up.  She was contacted on the day after discharge and felt to be doing well.  She is currently in rehabilitation.  She says she has had marked improvement in her shortness of breath.  She feels like her edema has improved.  She has decreased her dose of daily Lasix from 80 mg to 40 mg.  She is in persistent atrial fibrillation however rate controlled on amiodarone 400 mg which she is taking twice daily.  She reports she is getting stronger and hopefully will be out of rehabilitation within the next week.  She did miss 1 dose of Eliquis due to a transfer issue on May 28 and therefore will need 3 weeks of subsequent uninterrupted anticoagulation prior to attempted  cardioversion.  09/13/2017  Mrs. Erdman returns today for follow-up of cardioversion.  Unfortunately this was unsuccessful after 3 shocks.  She told me there is some confusion afterwards that she forgot to take her amiodarone.  She was taking it up until the cardioversion.  Since it was unsuccessful, I recommended rate control strategy.  At this point there is little benefit from being on amiodarone and the side effect profile would not make it favorable.  I would recommend just continuing carvedilol for rate control.  She is had significant weight loss now down to 313 from 340.  This will continue to help her symptoms.  Her shortness of breath has improved.  She has had no significant worsening edema.  No further significant GU bleeding.  09/28/2017  Mrs. Gilkerson returns today for follow-up of multiple ER visits.  I last saw her a couple weeks ago and unfortunately she was maintaining A. fib after unsuccessful cardioversion.  She continues to lose weight.  In fact she is down to 306 pounds today from 313.  Lab work indicates a rising creatinine and very low BNP, which makes me feel that she may be over diuresed.  She is also noted to be anemic with iron deficiency and is anticoagulated on Eliquis.  Her PCP is likely to start her on iron.  She denies any blood in the stool however stool guaiacs or work-up for microscopic iron is indicated.  She reports intermittent hematuria which is been minor.  Blood pressure is noted to be low today 94/69.  She is on low-dose carvedilol for rate control as well as hydrochlorothiazide and low-dose Lasix.  Recently she said she has had improvement in her breathing and for the first time the other day was able to sleep without oxygen.  10/12/2017  Mrs. Garcia was seen again today in follow-up.  She called the office that she noted to have significant weight gain.  In July she was 313 and had lost weight down to 297.  Subsequently was found out that she was possibly overtreated  with her thyroid medication and her dose was decreased from 300 mcg to 200 mcg daily by her endocrinologist.  Since then she has gained weight but she has been up 12 pounds over the past 2 weeks.  She does report some edema.  I had stopped her hydrochlorothiazide due to hypotension and blood pressure is better today 112/70.  She takes low-dose  Lasix 20 mg daily.  She also reported that she now has stage III chronic kidney disease.  12/13/2017  Mrs. Patchin returns today for follow-up.  She is done well and felt very stable.  Although her weight is up about 10 pounds she denies any worsening fluid.  She remains on 20 mg Lasix daily.  She is been working with her endocrinologist who reduced her levothyroxine.  Some of the weight gain may be related to that as she was on a very high dose of 300 mcg daily.  She denies any worsening shortness of breath.  She is having problems with her right shoulder and is scheduled to go arthroplasty by Dr. Alma Friendly.  She is here today for preoperative risk assessment.  She denies any chest pain.  She is on Eliquis for A. fib which is persistent but rate control.  She would have to stop this typically about 3 days prior to the procedure.  01/15/2018  JillianQuinney is seen today for follow-up of recent surgery.  Actually she did not have surgery rather was induced by anesthesia and then had significant hypotension and instability requiring IV fluids and pressors.  Based on that response anesthesia recommended that the surgery be canceled.  I spoke with Dr. Alma Friendly subsequent to that about the episode and agreed to see her back to help determine why she may have had such a response.  She actually had significant improvement in LV function by echo 6 months ago back to normal.  She has not had recurrent A. fib and has been euvolemic.  She is on a diuretic.  Weight has been going back up however this is been related to changing her thyroid medications as she was iatrogenically hyperthyroid.   Stress testing was not pursued because she has a history of normal coronaries by cath in 2012 and since her EF improved recently with medical therapy alone, this would have been unlikely if she had significant coronary disease.  I suspect that her hypotension was due to being volume depleted and that she had a marked response to the propofol leading to her hypotension.  06/04/2018  Ms. Lepore was seen today for hospital follow-up as a transition of care 7 visit.  Unfortunately she was admitted with confusion and seizure.  She was started on Keppra.  She did recover from this but was noted to be in A. fib with RVR.  Her carvedilol was switched to Toprol-XL 100 mg daily.  As she had hypotension, her diuretic was discontinued.  Today she reports her weight is even lower.  She denies any new swelling.  She has had some confusion and feels hot and chilled at times.  We had difficulty getting her blood pressure today however I believe it is between 90 and 588 systolic.  This is consistent with her discharge blood pressure readings.  She is concerned she may be on too much metoprolol.  09/09/2018  Ms. Mortimer is seen today in follow-up.  Overall she is doing well.  In fact she is lost more weight.  She is now 249 pounds.  She is trying to lose enough weight where she might be a candidate for surgery.  She is not had any further seizure activity.  She does have follow-up with her neurologist tomorrow who may adjust her antiepileptic medications.  I had decreased her beta-blocker because of hypotension.  Blood pressure was actually not able to be obtained today.  She says at home generally runs around 502 systolic.  She  is not had any presyncopal episodes related to this.  Overall she feels well.  She is actually expressed some interest in exertion and perhaps pulmonary rehabilitation for diastolic heart failure.  She generally has NYHA class II-III symptoms.  12/05/2018  Ms. Reeves is seen back today in follow-up.  In  the interim she is [name], NP, is working with her for shortness of breath.  Some additional testing was performed that showed some anemia.  Was also thought may be there was some congestive heart failure.  She was then seen in the ER with concerns about possible DVT/PE.  Work-up there was negative and the chest x-ray suggested the possibility of a pericardial effusion and further testing was recommended.  BNP was close to 300 but not significantly elevated.  Subsequent to that she had started taking Lasix now on a daily basis.  Her last echo showed a low normal LVEF of 50 to 55% with trivial pericardial fluid back in April 2020.  She remains in persistent atrial fibrillation which is rate controlled.  01/24/2019  Ms. Plato is seen today for follow-up of her dyspnea.  She continues to have shortness of breath.  She is scheduled to have another knee replacement surgery coming up.  She is also ordered for pulmonary function testing and does have a pulmonary follow-up in January.  I suspect there may be a pulmonary issue.  Her weight is gone up about 10 pounds as well however she feels that this may be fluid related.  She is also noted increased lower extremity swelling.  She has been using Lasix 40 mg as needed but not regularly.  I think she may need to be on daily diuretic.  04/02/2019  Ms. Egli returns today for follow-up.  She reports her dyspnea has improved.  She was thought to have moderate persistent asthma and her symptoms have improved with adjustment in her inhalers.  She also was found to be mildly volume overloaded on increased her diuretics.  Finally she was iatrogenically hyperthyroid.  This was corrected as well.  She also had some mild anemia which was improved with iron.  Overall the combination of these factors made her short of breath and somewhat fatigued.  She ultimately did not undergo her knee surgery but plans to reschedule in the near future.  From a cardiac standpoint she is  at acceptable risk for this.  PMHx:  Past Medical History:  Diagnosis Date  . Anemia   . Asthma    flare up last yr  . CHF (congestive heart failure) ([place])   . Chronic headache    " better now "   . DOE (dyspnea on exertion)    2D ECHO, 02/12/2012 - EF 60-65%, moderate concentric hypertrophy  . Dysrhythmia   . Fibromyalgia    nerve pain"left side at waist level" "can't lay on that side without pain" , "HOB elevation helps"; 9-17- since i lost the wieght , i dont  think i have this anymore   . Heart murmur   . Hematuria - cause not known    resolved   . History of kidney stones    x 2 '13, '14 surgery to remove  . Hypertension   . Pneumonia   . SBO (small bowel obstruction) ([place]) 06/07/2013  . SBO (small bowel obstruction) ([place])    rqueired admission 2020  . Thyroid disease    "goiter"  . Transfusion history    10 yrs+    Past Surgical History:  Procedure Laterality Date  . ABDOMINAL ADHESION SURGERY  2004   open LOA - in [place]  . CARDIAC CATHETERIZATION  04/04/2010   No significant obstructive coronary artery disease  . CARDIOVERSION N/A 08/08/2017   Procedure: CARDIOVERSION;  Surgeon: Pixie Casino, MD;  Location: [facility];  Service: Cardiovascular;  Laterality: N/A;  . CHOLECYSTECTOMY  1990  . COLONOSCOPY W/ POLYPECTOMY    . COLONOSCOPY WITH PROPOFOL N/A 04/10/2014   Procedure: COLONOSCOPY WITH PROPOFOL;  Surgeon: Beryle Beams, MD;  Location: [facility];  Service: Endoscopy;  Laterality: N/A;  . EYE SURGERY     lasik 20-25 yrs ago  . GASTROPLASTY VERTICAL BANDED  1985  . [place]   In [place] for SBO  . REVERSE SHOULDER ARTHROPLASTY Right 03/29/2018   Procedure: REVERSE SHOULDER ARTHROPLASTY;  Surgeon: Netta Cedars, MD;  Location: [facility];  Service: Orthopedics;  Laterality: Right;  . ROUX-EN-Y GASTRIC BYPASS  1992   Conversion VBG to RnYGB in [place], [place]  . SHOULDER ARTHROSCOPY WITH SUBACROMIAL DECOMPRESSION  2016    Dr Berenice Primas, [place], [place]  . SMALL INTESTINE SURGERY  2000   SBO - LOA w SB resection  . TOTAL KNEE ARTHROPLASTY Left 11/14/2018   Procedure: TOTAL KNEE ARTHROPLASTY;  Surgeon: Paralee Cancel, MD;  Location: [facility];  Service: Orthopedics;  Laterality: Left;  70 mins  . TOTAL THYROIDECTOMY  06/26/2012   Dr Celine Ahr, [facility]  . TUBAL LIGATION  1986  . URETHRAL DILATION  2012   w cystoscopy.  Dr Gaynelle Arabian    FAMHx:  Family History  Problem Relation Age of Onset  . Diabetes Mother   . Epilepsy Mother   . Cancer Mother        Breast  . Hypertension Mother   . Breast cancer Mother   . Seizures Mother   . Kidney disease Father   . Diabetes Father   . Hypertension Father   . Asthma Father   . Heart disease Father   . Epilepsy Sister   . Cancer Maternal Grandmother   . Breast cancer Maternal Grandmother   . Cancer Paternal Grandmother   . Breast cancer Paternal Grandmother     SOCHx:   reports that she has never smoked. She has never used smokeless tobacco. She reports that she does not drink alcohol or use drugs.  ALLERGIES:  Allergies  Allergen Reactions  . Hydrocodone Itching         ROS: Pertinent items noted in HPI and remainder of comprehensive ROS otherwise negative.  HOME MEDS: Current Outpatient Medications  Medication Sig Dispense Refill  . acetaminophen (TYLENOL) 650 MG CR tablet Take 650-1,300 mg by mouth every 8 (eight) hours as needed for pain.     Marland Kitchen albuterol (VENTOLIN HFA) 108 (90 Base) MCG/ACT inhaler Inhale 2 puffs into the lungs every 6 (six) hours as needed for wheezing or shortness of breath. 8 g 4  . calcitRIOL (ROCALTROL) 0.25 MCG capsule Take 1 capsule (0.25 mcg total) by mouth 2 (two) times daily. 180 capsule 3  . clotrimazole (LOTRIMIN) 1 % cream APPLY TO AFFECTED AREA TWICE A DAY 45 g 0  . cyanocobalamin (,VITAMIN B-12,) 1000 MCG/ML injection Inject 1 mL (1,000 mcg total) into the muscle every 30 (thirty) days. 1 mL 0  . cyclobenzaprine  (FLEXERIL) 10 MG tablet Take 2 tablets (20 mg total) by mouth 2 (two) times daily. (Patient taking differently: Take 10 mg by mouth 3 (three) times daily as  needed for muscle spasms. ) 80 tablet 0  . diclofenac Sodium (VOLTAREN) 1 % GEL Apply 1 application topically 4 (four) times daily as needed (pain.).     Marland Kitchen DULoxetine (CYMBALTA) 60 MG capsule Take 1 capsule (60 mg total) by mouth daily. 30 capsule 0  . ELIQUIS 5 MG TABS tablet TAKE 1 TABLET TWICE A DAY 180 tablet 3  . eszopiclone (LUNESTA) 2 MG TABS tablet Take 1 tablet (2 mg total) by mouth at bedtime. 20 tablet 0  . fluticasone furoate-vilanterol (BREO ELLIPTA) 100-25 MCG/INH AEPB Inhale 1 puff into the lungs daily. 180 each 0  . furosemide (LASIX) 20 MG tablet Take 1 tablet (20 mg total) by mouth as needed. (Patient taking differently: Take 20 mg by mouth daily. ) 90 tablet 2  . iron dextran complex (INFED) 50 MG/ML injection Infed 50 mg/mL injection solution  Inject 2 mL by intravenous route for 1 day.    . levETIRAcetam (KEPPRA) 500 MG tablet Take 1 tablet (500 mg total) by mouth 2 (two) times daily. 180 tablet 1  . levothyroxine (SYNTHROID) 150 MCG tablet Take 1 tablet (150 mcg total) by mouth daily. (Patient taking differently: Take 150 mcg by mouth daily before breakfast. ) 45 tablet 3  . magnesium oxide (MAG-OX) 400 MG tablet Take 1 tablet (400 mg total) by mouth daily. 30 tablet 0  . metoprolol succinate (TOPROL-XL) 25 MG 24 hr tablet Take 25 mg by mouth 2 (two) times daily.    . mirabegron ER (MYRBETRIQ) 50 MG TB24 tablet Take 1 tablet (50 mg total) by mouth daily. 30 tablet 0  . NONFORMULARY OR COMPOUNDED ITEM [facility]:  Antifungal Cream - Terbinafine 3%, Fluconazole 2%, Tea Tree Oil 5%, Urea 10%, Ibuprofen 2% in DMSO Suspension #60ml. Apply to affected toenail(s) once daily (at bedtime) or twice daily. (Patient taking differently: Apply 1 application topically at bedtime. [facility]:  Antifungal Cream - Terbinafine  3%, Fluconazole 2%, Tea Tree Oil 5%, Urea 10%, Ibuprofen 2% in DMSO Suspension #47ml. Apply to affected toenail(s) once daily (at bedtime) or twice daily.) 30 each 11  . polyethylene glycol (MIRALAX / GLYCOLAX) 17 g packet Take 17 g by mouth daily as needed for moderate constipation. 14 each 0  . traMADol (ULTRAM) 50 MG tablet Take 50 mg by mouth every 6 (six) hours as needed (pain).     . traZODone (DESYREL) 50 MG tablet Take 1 tablet (50 mg total) by mouth at bedtime. 30 tablet 0  . Vitamin D, Ergocalciferol, (DRISDOL) 1.25 MG (50000 UT) CAPS capsule Take 1 capsule (50,000 Units total) by mouth every Wednesday. 4 capsule 0   No current facility-administered medications for this visit.    LABS/IMAGING: No results found for this or any previous visit (from the past 48 hour(s)). No results found.  VITALS: BP 108/74   Pulse 78   Temp (!) 93.4 F (34.1 C)   Ht 5\' 7"  (1.702 m)   Wt 229 lb 3.2 oz (104 kg)   SpO2 99%   BMI 35.90 kg/m   EXAM: General appearance: alert and moderately obese Neck: no carotid bruit and no JVD Lungs: diminished breath sounds bilaterally Heart: irregularly irregular rhythm Abdomen: soft, non-tender; bowel sounds normal; no masses,  no organomegaly Extremities: edema 1-2+ bilateral lower extremity edema Pulses: 2+ and symmetric Skin: Skin color, texture, turgor normal. No rashes or lesions Neurologic: Grossly normal Psych: Pleasant  EKG: A. fib at 78-personally reviewed  ASSESSMENT: 1. Acute on chronic diastolic HF  2. Anesthesia-associated hypotension 3. New onset A. fib with RVR-CHADSVASC score of 4 -failed cardioversion on amiodarone 4. Chronic diastolic congestive heart failure-LVEF 25% (improved to 60-65% in 06/2017) 5. Super morbid obesity, with failure of 3 gastric bypass procedures 6. Pulmonary hypertension - PA pressure of 64 mmHg, normal LV systolic function 7. Progressive DOE 8. Hypertension-controlled  9. Possible A. fib/ectopic atrial  tachycardia-resolved 10. Seizures  PLAN: 1.   Ms. Farewell has had improvement in her shortness of breath which was multifactorial due to anemia, moderate persistent asthma, acute on chronic diastolic heart failure, etc.  Her symptoms have now improved significantly.  She is interested in having her knee replacement which was postponed as soon as it is able to be scheduled.  From a cardiac standpoint she is at acceptable risk for surgery.  She could hold her Eliquis 2 days prior to the procedure.  Restart afterwards.  Follow-up with me in 6 months.  Pixie Casino, MD, [facility], [place] Director of the Advanced Lipid Disorders &  Cardiovascular Risk Reduction Clinic Diplomate of the American Board of Clinical Lipidology Attending Cardiologist  Direct Dial: [phone]  Fax: [phone]  Website:  [email or web address] [email or web address] Latka 04/02/2019, 10:32 AM

## 2019-04-02 NOTE — Patient Instructions (Signed)
Medication Instructions:  No changes *If you need a refill on your cardiac medications before your next appointment, please call your pharmacy*  Lab Work: None  Testing/Procedures: None  Follow-Up: At [facility], you and your health needs are our priority.  As part of our continuing mission to provide you with exceptional heart care, we have created designated Provider Care Teams.  These Care Teams include your primary Cardiologist (physician) and Advanced Practice Providers (APPs -  Physician Assistants and Nurse Practitioners) who all work together to provide you with the care you need, when you need it.  Your next appointment:   6 month(s)  You will receive a reminder letter in the mail two months in advance. If you don't receive a letter, please call our office to schedule the follow-up appointment.   The format for your next appointment:   In Person  Provider:   K. Mali Hilty, MD

## 2019-04-03 ENCOUNTER — Telehealth: Payer: Self-pay | Admitting: Internal Medicine

## 2019-04-03 NOTE — Telephone Encounter (Signed)
   [facility] Pre-operative Risk Assessment    Request for surgical clearance:  1. What type of surgery is being performed? Total knee arthroplasty @ [facility]  2. When is this surgery scheduled? TBD   3. What type of clearance is required (medical clearance vs. Pharmacy clearance to hold med vs. Both)? Both   4. Are there any medications that need to be held prior to surgery and how long? Eliquis  5. Practice name and name of physician performing surgery?  Dr. Paralee Cancel with [facility]  6. What is your office phone number [phone]    7.   What is your office fax number [phone] Attn: Orson Slick  8.   Anesthesia type (None, local, MAC, general) ? Spinal   Jillian Eaton M 04/03/2019, 7:57 AM  _________________________________________________________________   (provider comments below)

## 2019-04-03 NOTE — Telephone Encounter (Signed)
Patient with diagnosis of afib on Eliquis for anticoagulation.    Procedure: Total knee arthroplasty  Date of procedure: TBD  CHADS2-VASc score of  4 (CHF, HTN, AGE, female)  CrCl 74 ml/min  Per office protocol, patient can hold Eliquis for 3 days prior to procedure.    For orthopedic procedures please be sure to resume therapeutic (not prophylactic) dosing.

## 2019-04-03 NOTE — Telephone Encounter (Signed)
   Primary Cardiologist: Pixie Casino, MD  Chart reviewed as part of pre-operative protocol coverage. Given past medical history and time since last visit, based on ACC/AHA guidelines, Jillian Eaton would be at acceptable risk for the planned procedure without further cardiovascular testing.   Her RCRI is a class I risk, 0.4% risk of major cardiac event.  Patient with diagnosis of afib on Eliquis for anticoagulation.    Procedure: Total knee arthroplasty  Date of procedure: TBD  CHADS2-VASc score of  4 (CHF, HTN, AGE, female)  CrCl 74 ml/min  Per office protocol, patient can hold Eliquis for 3 days prior to procedure.    For orthopedic procedures please be sure to resume therapeutic (not prophylactic) dosing.  I will route this recommendation to the requesting party via Epic fax function and remove from pre-op pool.  Please call with questions.  [name]. [facility] [address] Office [phone] Fax [phone]

## 2019-04-07 ENCOUNTER — Other Ambulatory Visit: Payer: Self-pay | Admitting: Internal Medicine

## 2019-04-08 ENCOUNTER — Ambulatory Visit: Payer: Medicare Other | Admitting: Neurology

## 2019-04-14 ENCOUNTER — Other Ambulatory Visit: Payer: Self-pay

## 2019-04-14 ENCOUNTER — Ambulatory Visit: Payer: Medicare Other | Admitting: Podiatry

## 2019-04-14 ENCOUNTER — Ambulatory Visit (HOSPITAL_COMMUNITY)
Admission: RE | Admit: 2019-04-14 | Discharge: 2019-04-14 | Disposition: A | Discharge status: Home / Self Care | Payer: Medicare Other | Source: Ambulatory Visit | Attending: Internal Medicine | Admitting: Internal Medicine

## 2019-04-14 DIAGNOSIS — D509 Iron deficiency anemia, unspecified: Secondary | ICD-10-CM | POA: Insufficient documentation

## 2019-04-14 MED ORDER — SODIUM CHLORIDE 0.9 % IV SOLN
25.0000 mg | Freq: Once | INTRAVENOUS | Status: AC
  Administered 2019-04-14: 10:00:00 25 mg via INTRAVENOUS
  Filled 2019-04-14: qty 0.5

## 2019-04-14 MED ORDER — SODIUM CHLORIDE 0.9 % IV SOLN
100.0000 mg | Freq: Once | INTRAVENOUS | Status: AC
  Administered 2019-04-14: 100 mg via INTRAVENOUS
  Filled 2019-04-14: qty 2

## 2019-04-14 MED ORDER — SODIUM CHLORIDE 0.9 % IV SOLN
INTRAVENOUS | Status: DC | PRN
  Administered 2019-04-14: 10:00:00 250 mL via INTRAVENOUS

## 2019-04-14 NOTE — Discharge Instructions (Signed)
Iron Dextran injection What is this medicine? IRON DEXTRAN (AHY ern DEX tran) is an iron complex. Iron is used to make healthy red blood cells, which carry oxygen and nutrients through the body. This medicine is used to treat people who cannot take iron by mouth and have low levels of iron in the blood. This medicine may be used for other purposes; ask your health care provider or pharmacist if you have questions. COMMON BRAND NAME(S): Dexferrum, INFeD What should I tell my health care provider before I take this medicine? They need to know if you have any of these conditions:  anemia not caused by low iron levels  heart disease  high levels of iron in the blood  kidney disease  liver disease  an unusual or allergic reaction to iron, other medicines, foods, dyes, or preservatives  pregnant or trying to get pregnant  breast-feeding How should I use this medicine? This medicine is for injection into a vein or a muscle. It is given by a health care professional in a hospital or clinic setting. Talk to your pediatrician regarding the use of this medicine in children. While this drug may be prescribed for children as young as 4 months old for selected conditions, precautions do apply. Overdosage: If you think you have taken too much of this medicine contact a poison control center or emergency room at once. NOTE: This medicine is only for you. Do not share this medicine with others. What if I miss a dose? It is important not to miss your dose. Call your doctor or health care professional if you are unable to keep an appointment. What may interact with this medicine? Do not take this medicine with any of the following medications:  deferoxamine  dimercaprol  other iron products This medicine may also interact with the following medications:  chloramphenicol  deferasirox This list may not describe all possible interactions. Give your health care provider a list of all the  medicines, herbs, non-prescription drugs, or dietary supplements you use. Also tell them if you smoke, drink alcohol, or use illegal drugs. Some items may interact with your medicine. What should I watch for while using this medicine? Visit your doctor or health care professional regularly. Tell your doctor if your symptoms do not start to get better or if they get worse. You may need blood work done while you are taking this medicine. You may need to follow a special diet. Talk to your doctor. Foods that contain iron include: whole grains/cereals, dried fruits, beans, or peas, leafy green vegetables, and organ meats (liver, kidney). Long-term use of this medicine may increase your risk of some cancers. Talk to your doctor about how to limit your risk. What side effects may I notice from receiving this medicine? Side effects that you should report to your doctor or health care professional as soon as possible:  allergic reactions like skin rash, itching or hives, swelling of the face, lips, or tongue  blue lips, nails, or skin  breathing problems  changes in blood pressure  chest pain  confusion  fast, irregular heartbeat  feeling faint or lightheaded, falls  fever or chills  flushing, sweating, or hot feelings  joint or muscle aches or pains  pain, tingling, numbness in the hands or feet  seizures  unusually weak or tired Side effects that usually do not require medical attention (report to your doctor or health care professional if they continue or are bothersome):  change in taste (metallic taste)    diarrhea  headache  irritation at site where injected  nausea, vomiting  stomach upset This list may not describe all possible side effects. Call your doctor for medical advice about side effects. You may report side effects to FDA at 1-800-FDA-1088. Where should I keep my medicine? This drug is given in a hospital or clinic and will not be stored at home. NOTE: This  sheet is a summary. It may not cover all possible information. If you have questions about this medicine, talk to your doctor, pharmacist, or health care provider.  2020 Elsevier/Gold Standard (2007-06-25 16:59:50)  

## 2019-04-14 NOTE — Progress Notes (Signed)
PATIENT CARE CENTER NOTE  Diagnosis:Iron Deficiency Anemia   Provider:Bakare, Mobolaji, MD   Procedure:IV Infed   Note:Patient received 100 mg Infed infusion via PIV. Test dose given and patient observed for 1 hour. No adverse reaction noted and full dose given. Tolerated well with no adverse reaction. Vital signs stable. Discharge instructions given. Patient alert, oriented and ambulatory at discharge.

## 2019-04-17 ENCOUNTER — Ambulatory Visit (HOSPITAL_COMMUNITY): Payer: Medicare Other

## 2019-04-18 DIAGNOSIS — Z23 Encounter for immunization: Secondary | ICD-10-CM | POA: Diagnosis not present

## 2019-04-18 NOTE — H&P (Signed)
TOTAL KNEE ADMISSION H&P  Patient is being admitted for right total knee arthroplasty.  Subjective:  Chief Complaint:    Right knee primary OA / pain  HPI: Jillian Eaton, 69 y.o. female, has a history of pain and functional disability in the right knee due to arthritis and has failed non-surgical conservative treatments for greater than 12 weeks to includeNSAID's and/or analgesics, corticosteriod injections, viscosupplementation injections and activity modification.  Onset of symptoms was gradual, starting 30 years ago with gradually worsening course since that time. The patient noted prior procedures on the knee to include  arthroplasty on the left knee(s).  Patient currently rates pain in the right knee(s) at 10 out of 10 with activity. Patient has night pain, worsening of pain with activity and weight bearing, pain that interferes with activities of daily living, pain with passive range of motion, crepitus and joint swelling.  Patient has evidence of periarticular osteophytes and joint space narrowing by imaging studies. There is no active infection.  Risks, benefits and expectations were discussed with the patient.  Risks including but not limited to the risk of anesthesia, blood clots, nerve damage, blood vessel damage, failure of the prosthesis, infection and up to and including death.  Patient understand the risks, benefits and expectations and wishes to proceed with surgery.   PCP: [name], MD  D/C Plans:       Home   Post-op Meds:       No Rx given  Tranexamic Acid:      To be given - IV   Decadron:      Is to be given  FYI:      Eliquis  Dilaudid (allergy to Codeine)  DME:   Pt already has equipment   PT:   OPPT @ [facility]  Ph[facility]: [facility] [address]    Patient Active Problem List   Diagnosis Date Noted  . Medication management 03/11/2019  . Stiffness of left knee 12/12/2018  . Acute on chronic respiratory failure with hypoxia ([place]) 12/07/2018  .  Hypothyroidism 12/07/2018  . Normocytic anemia 12/07/2018  . Asthma 12/07/2018  . S/P left TKA 11/14/2018  . Status post total left knee replacement 11/14/2018  . Degenerative joint disease involving multiple joints on both sides of body 07/31/2018  . Rotator cuff tear arthropathy of right shoulder 04/05/2018  . Overactive bladder 04/05/2018  . Steroid-induced hyperglycemia   . Generalized OA   . 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 NICM (nonischemic cardiomyopathy) ([place]) 01/15/2018  . Osteoporosis 12/13/2017  . Rotator cuff tear arthropathy 11/29/2017  . Chronic diastolic CHF (congestive heart failure) ([place]) 09/28/2017  . Acute renal failure superimposed on stage 3 chronic kidney disease ([place]) 09/07/2017  . Hypomagnesemia with secondary hypocalcemia 09/07/2017  . 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 Hypercapnemia 07/13/2017  . AKI (acute kidney injury) ([place]) 07/10/2017  . Vertigo 07/08/2017  . Blurring of visual image 07/08/2017  . On anticoagulant therapy 06/19/2017  . Chronic respiratory failure with hypoxia ([place]) 04/19/2017  . GERD (gastroesophageal reflux disease) 08/28/2016  . 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 Depressive disorder 08/28/2016  . Iatrogenic hypocalcemia 08/28/2016  . Chronic pain syndrome   . Acute diastolic (congestive) heart failure ([place])   . Oropharyngeal dysphagia   . Chronic atrial fibrillation ([place]) 04/28/2016  . Vocal cord dysfunction 04/28/2016  . Thrombocytopenia ([place]) 04/28/2016  . Seizures ([place])   . Preoperative cardiovascular examination   . 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  . Leg  swelling 10/19/2015  . Acute on chronic respiratory failure with hypoxia and hypercapnia ([place]) 08/03/2015  . Bilateral leg edema 05/11/2015  . Essential hypertension 05/11/2015  . Nephrolithiasis 04/12/2015  . Numbness in both hands 04/12/2015  . Complete tear of left rotator cuff 11/19/2014  . Primary osteoarthritis  of both knees 03/24/2014  . Chronic asthmatic bronchitis ([place]) 07/30/2013  . Bariatric surgery status 07/30/2013  . 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 Urge incontinence of urine 07/30/2013  . Vitamin D deficiency 07/30/2013  . B-complex deficiency 07/30/2013  . Cough 07/30/2013  . Gross hematuria 07/30/2013  . Goiter 07/30/2013  . Palpitations 07/30/2013  . Right flank pain 07/30/2013  . SBO (small bowel obstruction) ([place]) 06/07/2013  . Pulmonary HTN ([place]) 01/08/2013  . Insomnia 03/22/2012  . Mild intermittent asthma with acute exacerbation 03/22/2012  . Fibromyalgia 10/26/2011  . Anemia of chronic disease 03/28/2011  . Obesity (BMI 30-39.9) 09/22/2010   Past Medical History:  Diagnosis Date  . Anemia   . Asthma    flare up last yr  . CHF (congestive heart failure) ([place])   . Chronic headache    " better now "   . DOE (dyspnea on exertion)    2D ECHO, 02/12/2012 - EF 60-65%, moderate concentric hypertrophy  . Dysrhythmia   . Fibromyalgia    nerve pain"left side at waist level" "can't lay on that side without pain" , "HOB elevation helps"; 9-17- since i lost the wieght , i dont  think i have this anymore   . Heart murmur   . Hematuria - cause not known    resolved   . History of kidney stones    x 2 '13, '14 surgery to remove  . Hypertension   . Pneumonia   . SBO (small bowel obstruction) ([place]) 06/07/2013  . SBO (small bowel obstruction) ([place])    rqueired admission 2020  . Thyroid disease    "goiter"  . Transfusion history    10 yrs+    Past Surgical History:  Procedure Laterality Date  . ABDOMINAL ADHESION SURGERY  2004   open LOA - in [place]  . CARDIAC CATHETERIZATION  04/04/2010   No significant obstructive coronary artery disease  . CARDIOVERSION N/A 08/08/2017   Procedure: CARDIOVERSION;  Surgeon: Pixie Casino, MD;  Location: [place];  Service: Cardiovascular;  Laterality: N/A;  . CHOLECYSTECTOMY  1990  . COLONOSCOPY W/ POLYPECTOMY    .  COLONOSCOPY WITH PROPOFOL N/A 04/10/2014   Procedure: COLONOSCOPY WITH PROPOFOL;  Surgeon: Beryle Beams, MD;  Location: [facility];  Service: Endoscopy;  Laterality: N/A;  . EYE SURGERY     lasik 20-25 yrs ago  . GASTROPLASTY VERTICAL BANDED  1985  . [place]   In [place] for SBO  . REVERSE SHOULDER ARTHROPLASTY Right 03/29/2018   Procedure: REVERSE SHOULDER ARTHROPLASTY;  Surgeon: Netta Cedars, MD;  Location: [facility];  Service: Orthopedics;  Laterality: Right;  . ROUX-EN-Y GASTRIC BYPASS  1992   Conversion VBG to RnYGB in [place], [place]  . SHOULDER ARTHROSCOPY WITH SUBACROMIAL DECOMPRESSION  2016   Dr Berenice Primas, [place], [place]  . SMALL INTESTINE SURGERY  2000   SBO - LOA w SB resection  . TOTAL KNEE ARTHROPLASTY Left 11/14/2018   Procedure: TOTAL KNEE ARTHROPLASTY;  Surgeon: Paralee Cancel, MD;  Location: [facility];  Service: Orthopedics;  Laterality: Left;  70 mins  . TOTAL THYROIDECTOMY  06/26/2012   Dr Celine Ahr, [facility]  . TUBAL  LIGATION  1986  . URETHRAL DILATION  2012   w cystoscopy.  Dr Gaynelle Arabian    No current facility-administered medications for this encounter.   Current Outpatient Medications  Medication Sig Dispense Refill Last Dose  . acetaminophen (TYLENOL) 650 MG CR tablet Take 1,300 mg by mouth in the morning and at bedtime.      Marland Kitchen albuterol (VENTOLIN HFA) 108 (90 Base) MCG/ACT inhaler Inhale 2 puffs into the lungs every 6 (six) hours as needed for wheezing or shortness of breath. 8 g 4   . calcitRIOL (ROCALTROL) 0.5 MCG capsule Take 0.5 mcg by mouth daily.     . clotrimazole (LOTRIMIN) 1 % cream APPLY TO AFFECTED AREA TWICE A DAY (Patient taking differently: Apply 1 application topically daily as needed (itching). ) 45 g 0   . cyanocobalamin (,VITAMIN B-12,) 1000 MCG/ML injection Inject 1 mL (1,000 mcg total) into the muscle every 30 (thirty) days. 1 mL 0   . cyclobenzaprine (FLEXERIL) 10 MG tablet Take 2 tablets (20 mg total)  by mouth 2 (two) times daily. (Patient taking differently: Take 10 mg by mouth 3 (three) times daily as needed for muscle spasms. ) 80 tablet 0   . DULoxetine (CYMBALTA) 60 MG capsule Take 1 capsule (60 mg total) by mouth daily. 30 capsule 0   . ELIQUIS 5 MG TABS tablet TAKE 1 TABLET TWICE A DAY (Patient taking differently: Take 5 mg by mouth 2 (two) times daily. ) 180 tablet 3   . eszopiclone (LUNESTA) 2 MG TABS tablet Take 1 tablet (2 mg total) by mouth at bedtime. 20 tablet 0   . fluticasone furoate-vilanterol (BREO ELLIPTA) 100-25 MCG/INH AEPB Inhale 1 puff into the lungs daily. 180 each 0   . furosemide (LASIX) 20 MG tablet Take 1 tablet (20 mg total) by mouth as needed. (Patient taking differently: Take 20 mg by mouth daily as needed for fluid. ) 90 tablet 2   . levETIRAcetam (KEPPRA) 500 MG tablet Take 1 tablet (500 mg total) by mouth 2 (two) times daily. 180 tablet 1   . levothyroxine (SYNTHROID) 150 MCG tablet TAKE 1 TABLET BY MOUTH EVERY DAY (Patient taking differently: Take 150 mcg by mouth daily before breakfast. ) 90 tablet 1   . magnesium oxide (MAG-OX) 400 MG tablet Take 1 tablet (400 mg total) by mouth daily. 30 tablet 0   . metoprolol succinate (TOPROL-XL) 25 MG 24 hr tablet Take 25 mg by mouth 2 (two) times daily.     . mirabegron ER (MYRBETRIQ) 50 MG TB24 tablet Take 1 tablet (50 mg total) by mouth daily. (Patient taking differently: Take 50 mg by mouth at bedtime as needed (incontinence). ) 30 tablet 0   . NONFORMULARY OR COMPOUNDED ITEM [facility]:  Antifungal Cream - Terbinafine 3%, Fluconazole 2%, Tea Tree Oil 5%, Urea 10%, Ibuprofen 2% in DMSO Suspension #74ml. Apply to affected toenail(s) once daily (at bedtime) or twice daily. (Patient taking differently: Apply 1 application topically at bedtime. [facility]:  Antifungal Cream - Terbinafine 3%, Fluconazole 2%, Tea Tree Oil 5%, Urea 10%, Ibuprofen 2% in DMSO Suspension #13ml. Apply to affected toenail(s) once  daily (at bedtime) or twice daily.) 30 each 11   . polyethylene glycol (MIRALAX / GLYCOLAX) 17 g packet Take 17 g by mouth daily as needed for moderate constipation. 14 each 0   . traMADol (ULTRAM) 50 MG tablet Take 50 mg by mouth every 6 (six) hours as needed (pain).      Marland Kitchen  traZODone (DESYREL) 50 MG tablet Take 1 tablet (50 mg total) by mouth at bedtime. 30 tablet 0   . Vitamin D, Ergocalciferol, (DRISDOL) 1.25 MG (50000 UT) CAPS capsule Take 1 capsule (50,000 Units total) by mouth every Wednesday. 4 capsule 0   . calcitRIOL (ROCALTROL) 0.25 MCG capsule Take 1 capsule (0.25 mcg total) by mouth 2 (two) times daily. (Patient not taking: Reported on 04/17/2019) 180 capsule 3 Not Taking at Unknown time   Allergies  Allergen Reactions  . Hydrocodone Itching         Social History   Tobacco Use  . Smoking status: Never Smoker  . Smokeless tobacco: Never Used  Substance Use Topics  . Alcohol use: No    Comment: wine occ    Family History  Problem Relation Age of Onset  . Diabetes Mother   . Epilepsy Mother   . Cancer Mother        Breast  . Hypertension Mother   . Breast cancer Mother   . Seizures Mother   . Kidney disease Father   . Diabetes Father   . Hypertension Father   . Asthma Father   . Heart disease Father   . Epilepsy Sister   . Cancer Maternal Grandmother   . Breast cancer Maternal Grandmother   . Cancer Paternal Grandmother   . Breast cancer Paternal Grandmother      Review of Systems  Constitutional: Negative.   HENT: Negative.   Eyes: Negative.   Respiratory: Negative.   Cardiovascular: Negative.   Gastrointestinal: Negative.   Genitourinary: Negative.   Musculoskeletal: Positive for joint pain.  Skin: Negative.   Neurological: Negative.   Endo/Heme/Allergies: Negative.   Psychiatric/Behavioral: Negative.       Objective:  Physical Exam  Constitutional: She is oriented to person, place, and time. She appears well-developed.  HENT:  Head:  Normocephalic.  Eyes: Pupils are equal, round, and reactive to light.  Neck: No JVD present. No tracheal deviation present. No thyromegaly present.  Cardiovascular: Normal rate, regular rhythm and intact distal pulses.  Murmur heard. Respiratory: Effort normal and breath sounds normal. No respiratory distress. She has no wheezes.  GI: Soft. There is no abdominal tenderness. There is no guarding.  Musculoskeletal:     Cervical back: Neck supple.  Lymphadenopathy:    She has no cervical adenopathy.  Neurological: She is alert and oriented to person, place, and time. A sensory deficit (occasional numbness and tingling LEs) is present.  Skin: Skin is warm and dry.  Psychiatric: She has a normal mood and affect.     Labs:  Estimated body mass index is 35.9 kg/m as calculated from the following:   Height as of 04/02/19: 5\' 7"  (1.702 m).   Weight as of 04/02/19: 104 kg.   Imaging Review Plain radiographs demonstrate severe degenerative joint disease of the right knee. The bone quality appears to be good for age and reported activity level.      Assessment/Plan:  End stage arthritis, right knee   The patient history, physical examination, clinical judgment of the provider and imaging studies are consistent with end stage degenerative joint disease of the right knee and total knee arthroplasty is deemed medically necessary. The treatment options including medical management, injection therapy arthroscopy and arthroplasty were discussed at length. The risks and benefits of total knee arthroplasty were presented and reviewed. The risks due to aseptic loosening, infection, stiffness, patella tracking problems, thromboembolic complications and other imponderables were discussed. The patient acknowledged the  explanation, agreed to proceed with the plan and consent was signed. Patient is being admitted for inpatient treatment for surgery, pain control, PT, OT, prophylactic antibiotics, VTE  prophylaxis, progressive ambulation and ADL's and discharge planning. The patient is planning to be discharged home.    Patient's anticipated LOS is less than 2 midnights, meeting these requirements: - Lives within 1 hour of care - Has a competent adult at home to recover with post-op recover - NO history of  - Chronic pain requiring opiods  - Diabetes  - Coronary Artery Disease  - Heart failure  - Heart attack  - Stroke  - DVT/VTE  - Cardiac arrhythmia  - Respiratory Failure/COPD  - Advanced Liver disease     [place] Pugh. Zhoey Blackstock   PA-C  04/18/2019, 1:05 PM

## 2019-04-21 DIAGNOSIS — R519 Headache, unspecified: Secondary | ICD-10-CM

## 2019-04-21 HISTORY — DX: Headache, unspecified: R51.9

## 2019-04-23 ENCOUNTER — Encounter (HOSPITAL_COMMUNITY): Payer: Self-pay

## 2019-04-23 NOTE — Progress Notes (Signed)
PCP - Maia Petties, [place] Cardiologist - Lyman Bishop clearance 04-03-19 epic  Chest x-ray -  EKG - 04-02-19 epic Stress Test -  ECHO - 12-08-18 epic Cardiac Cath -  PFT 03-11-19 epic Sleep Study -  CPAP -   Fasting Blood Sugar -  Checks Blood Sugar _____ times a day  Blood Thinner Instructions:Elequis  Aspirin Instructions: Last Dose:Hold elequis 3 days prior  Anesthesia review: CHF , pt. Stated at preop call when she took her metoprolol prior to shoulder surgery her surgery was cancelled due to bp to low and for her knee surgery she  Didn't take it and everything went fine.   Patient denies shortness of breath, fever, cough and chest pain at PAT appointment   NONE   Patient verbalized understanding of instructions that were given to them at the PAT appointment. Patient was also instructed that they will need to review over the PAT instructions again at home before surgery.

## 2019-04-23 NOTE — Patient Instructions (Addendum)
DUE TO COVID-19 ONLY ONE VISITOR IS ALLOWED TO COME WITH YOU AND STAY IN THE WAITING ROOM ONLY DURING PRE OP AND PROCEDURE DAY OF SURGERY. THE 1 VISITOR MAY VISIT WITH YOU AFTER SURGERY IN YOUR PRIVATE ROOM DURING VISITING HOURS ONLY!    10-8pm  YOU NEED TO HAVE A COVID 19 TEST ON_3-5-21______ @_905  am ______, THIS TEST MUST BE DONE BEFORE SURGERY, COME  [address], [place] , [id number].  ([place]) ONCE YOUR COVID TEST IS COMPLETED, PLEASE BEGIN THE QUARANTINE INSTRUCTIONS AS OUTLINED IN YOUR HANDOUT.                Bissie Dingee [facility]  04/23/2019   Your procedure is scheduled on: 04-29-19   Report to [facility] Main  Entrance   Report to admitting at    1015  AM     Call this number if you have problems the morning of surgery [phone]    Remember: NO SOLID FOOD AFTER MIDNIGHT THE NIGHT PRIOR TO SURGERY. NOTHING BY MOUTH EXCEPT CLEAR LIQUIDS UNTIL   0945 am  . PLEASE FINISH ENSURE DRINK PER SURGEON ORDER  WHICH NEEDS TO BE COMPLETED AT     0945 am then nothing by mouth .    BRUSH YOUR TEETH MORNING OF SURGERY AND RINSE YOUR MOUTH OUT, NO CHEWING GUM CANDY OR MINTS.     Take these medicines the morning of surgery with A SIP OF WATER: metoprolol, levothyroxine, keppra, cymbalta, inhaler  If needed and bring with you                                 You may not have any metal on your body including hair pins and              piercings  Do not wear jewelry, make-up, lotions, powders or perfumes, deodorant             Do not wear nail polish on your fingernails.  Do not shave  48 hours prior to surgery.                 Do not bring valuables to the hospital. [place].  Contacts, dentures or bridgework may not be worn into surgery.                Please read over the following fact sheets you were given: _____________________________________________________________________            [facility] - Preparing for Surgery Before surgery, you can play an important role.  Because skin is not sterile, your skin needs to be as free of germs as possible.  You can reduce the number of germs on your skin by washing with CHG (chlorahexidine gluconate) soap before surgery.  CHG is an antiseptic cleaner which kills germs and bonds with the skin to continue killing germs even after washing. Please DO NOT use if you have an allergy to CHG or antibacterial soaps.  If your skin becomes reddened/irritated stop using the CHG and inform your nurse when you arrive at Short Stay. Do not shave (including legs and underarms) for at least 48 hours prior to the first CHG shower.  You may shave your face/neck. Please follow these instructions carefully:  1.  Shower with CHG Soap the  night before surgery and the  morning of Surgery.  2.  If you choose to wash your hair, wash your hair first as usual with your  normal  shampoo.  3.  After you shampoo, rinse your hair and body thoroughly to remove the  shampoo.                           4.  Use CHG as you would any other liquid soap.  You can apply chg directly  to the skin and wash                       Gently with a scrungie or clean washcloth.  5.  Apply the CHG Soap to your body ONLY FROM THE NECK DOWN.   Do not use on face/ open                           Wound or open sores. Avoid contact with eyes, ears mouth and genitals (private parts).                       Wash face,  Genitals (private parts) with your normal soap.             6.  Wash thoroughly, paying special attention to the area where your surgery  will be performed.  7.  Thoroughly rinse your body with warm water from the neck down.  8.  DO NOT shower/wash with your normal soap after using and rinsing off  the CHG Soap.                9.  Pat yourself dry with a clean towel.            10.  Wear clean pajamas.            11.  Place clean sheets on your bed the night of your first shower and do not   sleep with pets. Day of Surgery : Do not apply any lotions/deodorants the morning of surgery.  Please wear clean clothes to the hospital/surgery center.  FAILURE TO FOLLOW THESE INSTRUCTIONS MAY RESULT IN THE CANCELLATION OF YOUR SURGERY PATIENT SIGNATURE_________________________________  NURSE SIGNATURE__________________________________  ________________________________________________________________________   Adam Phenix  An incentive spirometer is a tool that can help keep your lungs clear and active. This tool measures how well you are filling your lungs with each breath. Taking long deep breaths may help reverse or decrease the chance of developing breathing (pulmonary) problems (especially infection) following:  A long period of time when you are unable to move or be active. BEFORE THE PROCEDURE   If the spirometer includes an indicator to show your best effort, your nurse or respiratory therapist will set it to a desired goal.  If possible, sit up straight or lean slightly forward. Try not to slouch.  Hold the incentive spirometer in an upright position. INSTRUCTIONS FOR USE  1. Sit on the edge of your bed if possible, or sit up as far as you can in bed or on a chair. 2. Hold the incentive spirometer in an upright position. 3. Breathe out normally. 4. Place the mouthpiece in your mouth and seal your lips tightly around it. 5. Breathe in slowly and as deeply as possible, raising the piston or the ball toward the top of the column. 6. Hold your breath for 3-5 seconds  or for as long as possible. Allow the piston or ball to fall to the bottom of the column. 7. Remove the mouthpiece from your mouth and breathe out normally. 8. Rest for a few seconds and repeat Steps 1 through 7 at least 10 times every 1-2 hours when you are awake. Take your time and take a few normal breaths between deep breaths. 9. The spirometer may include an indicator to show your best effort. Use  the indicator as a goal to work toward during each repetition. 10. After each set of 10 deep breaths, practice coughing to be sure your lungs are clear. If you have an incision (the cut made at the time of surgery), support your incision when coughing by placing a pillow or rolled up towels firmly against it. Once you are able to get out of bed, walk around indoors and cough well. You may stop using the incentive spirometer when instructed by your caregiver.  RISKS AND COMPLICATIONS  Take your time so you do not get dizzy or light-headed.  If you are in pain, you may need to take or ask for pain medication before doing incentive spirometry. It is harder to take a deep breath if you are having pain. AFTER USE  Rest and breathe slowly and easily.  It can be helpful to keep track of a log of your progress. Your caregiver can provide you with a simple table to help with this. If you are using the spirometer at home, follow these instructions: [place] IF:   You are having difficultly using the spirometer.  You have trouble using the spirometer as often as instructed.  Your pain medication is not giving enough relief while using the spirometer.  You develop fever of 100.5 F (38.1 C) or higher. SEEK IMMEDIATE MEDICAL CARE IF:   You cough up bloody sputum that had not been present before.  You develop fever of 102 F (38.9 C) or greater.  You develop worsening pain at or near the incision site. MAKE SURE YOU:   Understand these instructions.  Will watch your condition.  Will get help right away if you are not doing well or get worse. Document Released: 06/19/2006 Document Revised: 05/01/2011 Document Reviewed: 08/20/2006 ExitCare Patient Information 2014 Exit[employer], [place].   ________________________________________________________________________  WHAT IS A BLOOD TRANSFUSION? Blood Transfusion Information  A transfusion is the replacement of blood or some of its parts.  Blood is made up of multiple cells which provide different functions.  Red blood cells carry oxygen and are used for blood loss replacement.  White blood cells fight against infection.  Platelets control bleeding.  Plasma helps clot blood.  Other blood products are available for specialized needs, such as hemophilia or other clotting disorders. BEFORE THE TRANSFUSION  Who gives blood for transfusions?   Healthy volunteers who are fully evaluated to make sure their blood is safe. This is blood bank blood. Transfusion therapy is the safest it has ever been in the practice of medicine. Before blood is taken from a donor, a complete history is taken to make sure that person has no history of diseases nor engages in risky social behavior (examples are intravenous drug use or sexual activity with multiple partners). The donor's travel history is screened to minimize risk of transmitting infections, such as malaria. The donated blood is tested for signs of infectious diseases, such as HIV and hepatitis. The blood is then tested to be sure it is compatible with you in order to  minimize the chance of a transfusion reaction. If you or a relative donates blood, this is often done in anticipation of surgery and is not appropriate for emergency situations. It takes many days to process the donated blood. RISKS AND COMPLICATIONS Although transfusion therapy is very safe and saves many lives, the main dangers of transfusion include:   Getting an infectious disease.  Developing a transfusion reaction. This is an allergic reaction to something in the blood you were given. Every precaution is taken to prevent this. The decision to have a blood transfusion has been considered carefully by your caregiver before blood is given. Blood is not given unless the benefits outweigh the risks. AFTER THE TRANSFUSION  Right after receiving a blood transfusion, you will usually feel much better and more energetic. This is  especially true if your red blood cells have gotten low (anemic). The transfusion raises the level of the red blood cells which carry oxygen, and this usually causes an energy increase.  The nurse administering the transfusion will monitor you carefully for complications. HOME CARE INSTRUCTIONS  No special instructions are needed after a transfusion. You may find your energy is better. Speak with your caregiver about any limitations on activity for underlying diseases you may have. SEEK MEDICAL CARE IF:   Your condition is not improving after your transfusion.  You develop redness or irritation at the intravenous (IV) site. SEEK IMMEDIATE MEDICAL CARE IF:  Any of the following symptoms occur over the next 12 hours:  Shaking chills.  You have a temperature by mouth above 102 F (38.9 C), not controlled by medicine.  Chest, back, or muscle pain.  People around you feel you are not acting correctly or are confused.  Shortness of breath or difficulty breathing.  Dizziness and fainting.  You get a rash or develop hives.  You have a decrease in urine output.  Your urine turns a dark color or changes to pink, red, or brown. Any of the following symptoms occur over the next 10 days:  You have a temperature by mouth above 102 F (38.9 C), not controlled by medicine.  Shortness of breath.  Weakness after normal activity.  The white part of the eye turns yellow (jaundice).  You have a decrease in the amount of urine or are urinating less often.  Your urine turns a dark color or changes to pink, red, or brown. Document Released: 02/04/2000 Document Revised: 05/01/2011 Document Reviewed: 09/23/2007 [facility] Patient Information 2014 [place], [place].  _______________________________________________________________________

## 2019-04-24 ENCOUNTER — Encounter (HOSPITAL_COMMUNITY)
Admission: RE | Admit: 2019-04-24 | Discharge: 2019-04-24 | Disposition: A | Discharge status: Home / Self Care | Payer: Medicare Other | Source: Ambulatory Visit | Attending: Orthopedic Surgery | Admitting: Orthopedic Surgery

## 2019-04-24 ENCOUNTER — Encounter (HOSPITAL_COMMUNITY): Payer: Self-pay

## 2019-04-24 ENCOUNTER — Other Ambulatory Visit: Payer: Self-pay

## 2019-04-24 DIAGNOSIS — E039 Hypothyroidism, unspecified: Secondary | ICD-10-CM | POA: Insufficient documentation

## 2019-04-24 DIAGNOSIS — I428 Other cardiomyopathies: Secondary | ICD-10-CM | POA: Diagnosis not present

## 2019-04-24 DIAGNOSIS — I5032 Chronic diastolic (congestive) heart failure: Secondary | ICD-10-CM | POA: Diagnosis not present

## 2019-04-24 DIAGNOSIS — M1711 Unilateral primary osteoarthritis, right knee: Secondary | ICD-10-CM | POA: Insufficient documentation

## 2019-04-24 DIAGNOSIS — N183 Chronic kidney disease, stage 3 unspecified: Secondary | ICD-10-CM | POA: Diagnosis not present

## 2019-04-24 DIAGNOSIS — I130 Hypertensive heart and chronic kidney disease with heart failure and stage 1 through stage 4 chronic kidney disease, or unspecified chronic kidney disease: Secondary | ICD-10-CM | POA: Diagnosis not present

## 2019-04-24 DIAGNOSIS — Z01812 Encounter for preprocedural laboratory examination: Secondary | ICD-10-CM | POA: Diagnosis not present

## 2019-04-24 DIAGNOSIS — Z79899 Other long term (current) drug therapy: Secondary | ICD-10-CM | POA: Insufficient documentation

## 2019-04-24 HISTORY — DX: Other complications of anesthesia, initial encounter: T88.59XA

## 2019-04-24 HISTORY — DX: Unspecified osteoarthritis, unspecified site: M19.90

## 2019-04-24 LAB — CBC
HCT: 34.4 % — ABNORMAL LOW (ref 36.0–46.0)
Hemoglobin: 10.4 g/dL — ABNORMAL LOW (ref 12.0–15.0)
MCH: 28.6 pg (ref 26.0–34.0)
MCHC: 30.2 g/dL (ref 30.0–36.0)
MCV: 94.5 fL (ref 80.0–100.0)
Platelets: 240 10*3/uL (ref 150–400)
RBC: 3.64 MIL/uL — ABNORMAL LOW (ref 3.87–5.11)
RDW: 18 % — ABNORMAL HIGH (ref 11.5–15.5)
WBC: 5.1 10*3/uL (ref 4.0–10.5)
nRBC: 0 % (ref 0.0–0.2)

## 2019-04-24 LAB — BASIC METABOLIC PANEL
Anion gap: 9 (ref 5–15)
BUN: 29 mg/dL — ABNORMAL HIGH (ref 8–23)
CO2: 29 mmol/L (ref 22–32)
Calcium: 8.5 mg/dL — ABNORMAL LOW (ref 8.9–10.3)
Chloride: 103 mmol/L (ref 98–111)
Creatinine, Ser: 1.12 mg/dL — ABNORMAL HIGH (ref 0.44–1.00)
GFR calc Af Amer: 59 mL/min — ABNORMAL LOW (ref >60)
GFR calc non Af Amer: 51 mL/min — ABNORMAL LOW (ref >60)
Glucose, Bld: 79 mg/dL (ref 70–99)
Potassium: 4.7 mmol/L (ref 3.5–5.1)
Sodium: 141 mmol/L (ref 135–145)

## 2019-04-24 LAB — SURGICAL PCR SCREEN
MRSA, PCR: NEGATIVE
Staphylococcus aureus: NEGATIVE

## 2019-04-25 ENCOUNTER — Other Ambulatory Visit (HOSPITAL_COMMUNITY)
Admission: RE | Admit: 2019-04-25 | Discharge: 2019-04-25 | Disposition: A | Discharge status: Home / Self Care | Payer: Medicare Other | Source: Ambulatory Visit | Attending: Orthopedic Surgery | Admitting: Orthopedic Surgery

## 2019-04-25 DIAGNOSIS — Z01812 Encounter for preprocedural laboratory examination: Secondary | ICD-10-CM | POA: Diagnosis not present

## 2019-04-25 DIAGNOSIS — Z20822 Contact with and (suspected) exposure to covid-19: Secondary | ICD-10-CM | POA: Insufficient documentation

## 2019-04-25 LAB — SARS CORONAVIRUS 2 (TAT 6-24 HRS): SARS Coronavirus 2: NEGATIVE

## 2019-04-28 DIAGNOSIS — D519 Vitamin B12 deficiency anemia, unspecified: Secondary | ICD-10-CM | POA: Diagnosis not present

## 2019-04-28 NOTE — Progress Notes (Signed)
Anesthesia Chart Review   Case: [id number] Date/Time: 04/29/19 [id number]   Procedure: TOTAL KNEE ARTHROPLASTY (Right Knee) - 70 mins   Anesthesia type: Spinal   Pre-op diagnosis: Right knee osteoarthritis   [facility] ROOM 09 / [facility]   Surgeons: Paralee Cancel, MD      DISCUSSION:68 y.o. never smoker with h/o HTN, CHF, asthma, atrial fibrillation (on Eliquis), right knee OA scheduled for above procedure 04/29/19 with Dr. Paralee Cancel.   Pt cleared by cardiologist 04/03/2019.  Per [name], NP, "Chart reviewed as part of pre-operative protocol coverage. Given past medical history and time since last visit, based on ACC/AHA guidelines, Jillian Eaton would be at acceptable risk for the planned procedure without further cardiovascular testing.  Her RCRI is a class I risk, 0.4% risk of major cardiac event. Patient with diagnosis ofafibon Eliquisfor anticoagulation.  Procedure:Total knee arthroplasty Date of procedure:TBD CHADS2-VASc score of4(CHF, HTN, AGE,female) CrCl74 ml/min Per office protocol, patient can holdEliquisfor 3days prior to procedure.  For orthopedic procedures please be sure to resume therapeutic (not prophylactic) dosing."  S/p left knee arthroplasty 11/14/2018 with no anesthesia complications noted.   Anticipate pt can proceed with planned procedure barring acute status change.   VS: BP 105/77 (BP Location: Right Arm)   Pulse 77   Temp 36.4 C (Oral)   Resp 18   Ht 5' 6.5" (1.689 m)   Wt 99.1 kg   SpO2 99%   BMI 34.74 kg/m   PROVIDERS: [name], MD is PCP   Lyman Bishop, MD is Cardiologist  LABS: Labs reviewed: Acceptable for surgery. (all labs ordered are listed, but only abnormal results are displayed)  Labs Reviewed - No data to display   IMAGES:   EKG: 04/02/2019 Rate 78 bpm  Atrial fibrillation   CV: Echo 12/08/2018 IMPRESSIONS   1. Left ventricular ejection fraction, by visual estimation, is 55 to  60%.  The left ventricle has normal function. Normal left ventricular size.  There is no left ventricular hypertrophy.  2. Left ventricular diastolic function could not be evaluated pattern of  LV diastolic filling.  3. Global right ventricle has normal systolic function.The right  ventricular size is mildly enlarged. No increase in right ventricular wall  thickness.  4. Left atrial size was severely dilated.  5. Right atrial size was severely dilated.  6. The mitral valve is normal in structure. Trace mitral valve  regurgitation.  7. The tricuspid valve is normal in structure. Tricuspid valve  regurgitation mild-moderate.  8. The aortic valve is normal in structure. Aortic valve regurgitation is  trivial by color flow Doppler.  9. The pulmonic valve was grossly normal. Pulmonic valve regurgitation is  not visualized by color flow Doppler.  10. Normal pulmonary artery systolic pressure.  11. The inferior vena cava is normal in size with greater than 50%  respiratory variability, suggesting right atrial pressure of 3 mmHg.  Past Medical History:  Diagnosis Date  . Anemia    iron and pernicious  . Arthritis   . Asthma    flare up last yr  . CHF (congestive heart failure) ([place])   . Chronic headache    " better now "   . Complication of anesthesia    BP dropped   . DOE (dyspnea on exertion)    2D ECHO, 02/12/2012 - EF 60-65%, moderate concentric hypertrophy  . Dysrhythmia   . Fibromyalgia    nerve pain"left side at waist level" "can't lay on that side without  pain" , "HOB elevation helps"; pt. thinks this has resolved after 200lb weight loss9-17- since i lost the wieght , i dont  think i have this anymore   . Heart murmur   . Hematuria - cause not known    resolved   . History of kidney stones    x 2 '13, '14 surgery to remove  . Hypertension   . Pneumonia    aspiration  04/2016  . SBO (small bowel obstruction) ([place]) 06/07/2013  . SBO (small bowel obstruction) ([place])     rqueired admission 2020  . Thyroid disease    "goiter"  . Transfusion history    10 yrs+    Past Surgical History:  Procedure Laterality Date  . ABDOMINAL ADHESION SURGERY  2004   open LOA - in [place]  . CARDIAC CATHETERIZATION  04/04/2010   No significant obstructive coronary artery disease  . CARDIOVERSION N/A 08/08/2017   Procedure: CARDIOVERSION;  Surgeon: Pixie Casino, MD;  Location: [place];  Service: Cardiovascular;  Laterality: N/A;  . CHOLECYSTECTOMY  1990  . COLONOSCOPY W/ POLYPECTOMY    . COLONOSCOPY WITH PROPOFOL N/A 04/10/2014   Procedure: COLONOSCOPY WITH PROPOFOL;  Surgeon: Beryle Beams, MD;  [facility];  Service: Endoscopy;  Laterality: N/A;  . EYE SURGERY     lasik 20-25 yrs ago  . GASTROPLASTY VERTICAL BANDED  1985  . [place]   In [place] for SBO  . REVERSE SHOULDER ARTHROPLASTY Right 03/29/2018   Procedure: REVERSE SHOULDER ARTHROPLASTY;  Surgeon: Netta Cedars, MD;  Location: [facility];  Service: Orthopedics;  Laterality: Right;  . ROUX-EN-Y GASTRIC BYPASS  1992   Conversion VBG to RnYGB in [place], [place]  . SHOULDER ARTHROSCOPY WITH SUBACROMIAL DECOMPRESSION  2016   Dr Berenice Primas, [place], [place]  . SMALL INTESTINE SURGERY  2000   SBO - LOA w SB resection  . TOTAL KNEE ARTHROPLASTY Left 11/14/2018   Procedure: TOTAL KNEE ARTHROPLASTY;  Surgeon: Paralee Cancel, MD;  [facility];  Service: Orthopedics;  Laterality: Left;  70 mins  . TOTAL THYROIDECTOMY  06/26/2012   Dr Celine Ahr, [facility]  . TUBAL LIGATION  1986  . URETHRAL DILATION  2012   w cystoscopy.  Dr Gaynelle Arabian  . UTERINE FIBROID EMBOLIZATION      MEDICATIONS: . acetaminophen (TYLENOL) 650 MG CR tablet  . albuterol (VENTOLIN HFA) 108 (90 Base) MCG/ACT inhaler  . calcitRIOL (ROCALTROL) 0.25 MCG capsule  . calcitRIOL (ROCALTROL) 0.5 MCG capsule  . clotrimazole (LOTRIMIN) 1 % cream  . cyanocobalamin (,VITAMIN B-12,) 1000 MCG/ML injection  .  cyclobenzaprine (FLEXERIL) 10 MG tablet  . DULoxetine (CYMBALTA) 60 MG capsule  . ELIQUIS 5 MG TABS tablet  . eszopiclone (LUNESTA) 2 MG TABS tablet  . fluticasone furoate-vilanterol (BREO ELLIPTA) 100-25 MCG/INH AEPB  . furosemide (LASIX) 20 MG tablet  . levETIRAcetam (KEPPRA) 500 MG tablet  . levothyroxine (SYNTHROID) 150 MCG tablet  . magnesium oxide (MAG-OX) 400 MG tablet  . metoprolol succinate (TOPROL-XL) 25 MG 24 hr tablet  . mirabegron ER (MYRBETRIQ) 50 MG TB24 tablet  . NONFORMULARY OR COMPOUNDED ITEM  . polyethylene glycol (MIRALAX / GLYCOLAX) 17 g packet  . traMADol (ULTRAM) 50 MG tablet  . traZODone (DESYREL) 50 MG tablet  . Vitamin D, Ergocalciferol, (DRISDOL) 1.25 MG (50000 UT) CAPS capsule   No current facility-administered medications for this encounter.    Maia Plan [facility] Pre-Surgical Testing [phone] 04/28/19  10:46 AM

## 2019-04-28 NOTE — Anesthesia Preprocedure Evaluation (Addendum)
Anesthesia Evaluation  Patient identified by MRN, date of birth, ID band Patient awake    Reviewed: Allergy & Precautions, NPO status , Patient's Chart, lab work & pertinent test results  Airway Mallampati: III  TM Distance: >3 FB Neck ROM: Full    Dental  (+) Missing, Partial Upper, Partial Lower   Pulmonary asthma ,    Pulmonary exam normal breath sounds clear to auscultation       Cardiovascular hypertension, Pt. on home beta blockers +CHF and + DOE  Normal cardiovascular exam+ dysrhythmias Atrial Fibrillation  Rhythm:Irregular Rate:Normal  ECG: a-fib, rate 73  ECHO: 1. Left ventricular ejection fraction, by visual estimation, is 55 to 60%. The left ventricle has normal function. Normal left ventricular size. There is no left ventricular hypertrophy. 2. Left ventricular diastolic function could not be evaluated pattern of LV diastolic filling. 3. Global right ventricle has normal systolic function.The right ventricular size is mildly enlarged. No increase in right ventricular wall thickness. 4. Left atrial size was severely dilated. 5. Right atrial size was severely dilated. 6. The mitral valve is normal in structure. Trace mitral valve regurgitation. 7. The tricuspid valve is normal in structure. Tricuspid valve regurgitation mild-moderate. 8. The aortic valve is normal in structure. Aortic valve regurgitation is trivial by color flow Doppler. 9. The pulmonic valve was grossly normal. Pulmonic valve regurgitation is not visualized by color flow Doppler. 10. Normal pulmonary artery systolic pressure. 11. The inferior vena cava is normal in size with greater than 50% respiratory variability, suggesting right atrial pressure of 3 mmHg.   Neuro/Psych  Headaches, Seizures -, Well Controlled,  PSYCHIATRIC DISORDERS Depression    GI/Hepatic negative GI ROS, Neg liver ROS,   Endo/Other  Hypothyroidism   Renal/GU negative  Renal ROS     Musculoskeletal  (+) Arthritis , Fibromyalgia -  Abdominal (+) + obese,   Peds  Hematology  (+) anemia ,   Anesthesia Other Findings Right knee osteoarthritis  Reproductive/Obstetrics                            Anesthesia Physical Anesthesia Plan  ASA: III  Anesthesia Plan: Spinal and Regional   Post-op Pain Management:    Induction: Intravenous  PONV Risk Score and Plan: 2 and Ondansetron, Dexamethasone, Propofol infusion, Midazolam and Treatment may vary due to age or medical condition  Airway Management Planned: Simple Face Mask  Additional Equipment:   Intra-op Plan:   Post-operative Plan:   Informed Consent: I have reviewed the patients History and Physical, chart, labs and discussed the procedure including the risks, benefits and alternatives for the proposed anesthesia with the patient or authorized representative who has indicated his/her understanding and acceptance.     Dental advisory given  Plan Discussed with: CRNA and Surgeon  Anesthesia Plan Comments:        Anesthesia Quick Evaluation

## 2019-04-29 ENCOUNTER — Encounter (HOSPITAL_COMMUNITY)
Admission: RE | Disposition: A | Discharge status: Home / Self Care | Payer: Self-pay | Source: Home / Self Care | Attending: Orthopedic Surgery

## 2019-04-29 ENCOUNTER — Ambulatory Visit (HOSPITAL_COMMUNITY): Payer: Medicare Other | Admitting: Anesthesiology

## 2019-04-29 ENCOUNTER — Other Ambulatory Visit: Payer: Self-pay

## 2019-04-29 ENCOUNTER — Observation Stay (HOSPITAL_COMMUNITY)
Admission: RE | Admit: 2019-04-29 | Discharge: 2019-04-30 | Disposition: A | Discharge status: Home / Self Care | Payer: Medicare Other | Attending: Orthopedic Surgery | Admitting: Orthopedic Surgery

## 2019-04-29 ENCOUNTER — Encounter (HOSPITAL_COMMUNITY): Payer: Self-pay | Admitting: Orthopedic Surgery

## 2019-04-29 ENCOUNTER — Ambulatory Visit (HOSPITAL_COMMUNITY): Payer: Medicare Other | Admitting: Physician Assistant

## 2019-04-29 DIAGNOSIS — I130 Hypertensive heart and chronic kidney disease with heart failure and stage 1 through stage 4 chronic kidney disease, or unspecified chronic kidney disease: Secondary | ICD-10-CM | POA: Insufficient documentation

## 2019-04-29 DIAGNOSIS — I2510 Atherosclerotic heart disease of native coronary artery without angina pectoris: Secondary | ICD-10-CM | POA: Insufficient documentation

## 2019-04-29 DIAGNOSIS — N3941 Urge incontinence: Secondary | ICD-10-CM | POA: Diagnosis not present

## 2019-04-29 DIAGNOSIS — M25461 Effusion, right knee: Secondary | ICD-10-CM | POA: Diagnosis not present

## 2019-04-29 DIAGNOSIS — Z96653 Presence of artificial knee joint, bilateral: Secondary | ICD-10-CM | POA: Diagnosis not present

## 2019-04-29 DIAGNOSIS — E559 Vitamin D deficiency, unspecified: Secondary | ICD-10-CM | POA: Insufficient documentation

## 2019-04-29 DIAGNOSIS — M659 Synovitis and tenosynovitis, unspecified: Secondary | ICD-10-CM | POA: Insufficient documentation

## 2019-04-29 DIAGNOSIS — R569 Unspecified convulsions: Secondary | ICD-10-CM | POA: Insufficient documentation

## 2019-04-29 DIAGNOSIS — F329 Major depressive disorder, single episode, unspecified: Secondary | ICD-10-CM | POA: Diagnosis not present

## 2019-04-29 DIAGNOSIS — Z7951 Long term (current) use of inhaled steroids: Secondary | ICD-10-CM | POA: Insufficient documentation

## 2019-04-29 DIAGNOSIS — Z79890 Hormone replacement therapy (postmenopausal): Secondary | ICD-10-CM | POA: Insufficient documentation

## 2019-04-29 DIAGNOSIS — I5032 Chronic diastolic (congestive) heart failure: Secondary | ICD-10-CM | POA: Insufficient documentation

## 2019-04-29 DIAGNOSIS — Z7901 Long term (current) use of anticoagulants: Secondary | ICD-10-CM | POA: Diagnosis not present

## 2019-04-29 DIAGNOSIS — M1711 Unilateral primary osteoarthritis, right knee: Principal | ICD-10-CM | POA: Insufficient documentation

## 2019-04-29 DIAGNOSIS — E669 Obesity, unspecified: Secondary | ICD-10-CM | POA: Diagnosis present

## 2019-04-29 DIAGNOSIS — I482 Chronic atrial fibrillation, unspecified: Secondary | ICD-10-CM | POA: Diagnosis not present

## 2019-04-29 DIAGNOSIS — J45909 Unspecified asthma, uncomplicated: Secondary | ICD-10-CM | POA: Diagnosis not present

## 2019-04-29 DIAGNOSIS — Z96651 Presence of right artificial knee joint: Secondary | ICD-10-CM

## 2019-04-29 DIAGNOSIS — G8918 Other acute postprocedural pain: Secondary | ICD-10-CM | POA: Diagnosis not present

## 2019-04-29 DIAGNOSIS — E039 Hypothyroidism, unspecified: Secondary | ICD-10-CM | POA: Diagnosis not present

## 2019-04-29 DIAGNOSIS — Z79899 Other long term (current) drug therapy: Secondary | ICD-10-CM | POA: Insufficient documentation

## 2019-04-29 HISTORY — PX: TOTAL KNEE ARTHROPLASTY: SHX125

## 2019-04-29 LAB — TYPE AND SCREEN
ABO/RH(D): O POS
Antibody Screen: NEGATIVE

## 2019-04-29 SURGERY — ARTHROPLASTY, KNEE, TOTAL
Anesthesia: Regional | Site: Knee | Laterality: Right

## 2019-04-29 MED ORDER — BISACODYL 10 MG RE SUPP: 10.0000 mg | Freq: Every day | RECTAL | Status: DC | PRN

## 2019-04-29 MED ORDER — MENTHOL 3 MG MT LOZG: 1.0000 | LOZENGE | OROMUCOSAL | Status: DC | PRN

## 2019-04-29 MED ORDER — PROPOFOL 500 MG/50ML IV EMUL
INTRAVENOUS | Status: DC | PRN
  Administered 2019-04-29: 50 ug/kg/min via INTRAVENOUS

## 2019-04-29 MED ORDER — METOCLOPRAMIDE HCL 5 MG PO TABS: 5.0000 mg | ORAL_TABLET | Freq: Three times a day (TID) | ORAL | Status: DC | PRN

## 2019-04-29 MED ORDER — LEVETIRACETAM 500 MG PO TABS
500.0000 mg | ORAL_TABLET | Freq: Two times a day (BID) | ORAL | Status: DC
  Administered 2019-04-29 – 2019-04-30 (×2): 500 mg via ORAL
  Filled 2019-04-29 (×2): qty 1

## 2019-04-29 MED ORDER — PHENYLEPHRINE HCL-NACL 10-0.9 MG/250ML-% IV SOLN
INTRAVENOUS | Status: DC | PRN
  Administered 2019-04-29: 40 ug/min via INTRAVENOUS

## 2019-04-29 MED ORDER — PHENOL 1.4 % MT LIQD: 1.0000 | OROMUCOSAL | Status: DC | PRN

## 2019-04-29 MED ORDER — LEVOTHYROXINE SODIUM 75 MCG PO TABS
150.0000 ug | ORAL_TABLET | Freq: Every day | ORAL | Status: DC
  Administered 2019-04-30: 150 ug via ORAL
  Filled 2019-04-29: qty 2

## 2019-04-29 MED ORDER — METOCLOPRAMIDE HCL 5 MG/ML IJ SOLN: 5.0000 mg | Freq: Three times a day (TID) | INTRAMUSCULAR | Status: DC | PRN

## 2019-04-29 MED ORDER — ONDANSETRON HCL 4 MG/2ML IJ SOLN: 4.0000 mg | Freq: Four times a day (QID) | INTRAMUSCULAR | Status: DC | PRN

## 2019-04-29 MED ORDER — SODIUM CHLORIDE 0.9 % IR SOLN
Status: DC | PRN
  Administered 2019-04-29: 1000 mL

## 2019-04-29 MED ORDER — ACETAMINOPHEN 500 MG PO TABS
1000.0000 mg | ORAL_TABLET | Freq: Once | ORAL | Status: AC
  Administered 2019-04-29: 1000 mg via ORAL

## 2019-04-29 MED ORDER — APIXABAN 2.5 MG PO TABS
2.5000 mg | ORAL_TABLET | Freq: Two times a day (BID) | ORAL | Status: DC
  Administered 2019-04-30: 2.5 mg via ORAL
  Filled 2019-04-29: qty 1

## 2019-04-29 MED ORDER — ALBUTEROL SULFATE (2.5 MG/3ML) 0.083% IN NEBU: 3.0000 mL | INHALATION_SOLUTION | Freq: Four times a day (QID) | RESPIRATORY_TRACT | Status: DC | PRN

## 2019-04-29 MED ORDER — ONDANSETRON HCL 4 MG/2ML IJ SOLN: 4.0000 mg | Freq: Once | INTRAMUSCULAR | Status: DC | PRN

## 2019-04-29 MED ORDER — METHOCARBAMOL 500 MG IVPB - SIMPLE MED
500.0000 mg | Freq: Four times a day (QID) | INTRAVENOUS | Status: DC | PRN
  Filled 2019-04-29: qty 50

## 2019-04-29 MED ORDER — KETOROLAC TROMETHAMINE 30 MG/ML IJ SOLN
INTRAMUSCULAR | Status: AC
  Filled 2019-04-29: qty 1

## 2019-04-29 MED ORDER — ACETAMINOPHEN 500 MG PO TABS
ORAL_TABLET | ORAL | Status: AC
  Filled 2019-04-29: qty 2

## 2019-04-29 MED ORDER — DEXAMETHASONE SODIUM PHOSPHATE 10 MG/ML IJ SOLN
INTRAMUSCULAR | Status: AC
  Filled 2019-04-29: qty 1

## 2019-04-29 MED ORDER — ALUM & MAG HYDROXIDE-SIMETH 200-200-20 MG/5ML PO SUSP: 15.0000 mL | ORAL | Status: DC | PRN

## 2019-04-29 MED ORDER — BUPIVACAINE HCL (PF) 0.25 % IJ SOLN
INTRAMUSCULAR | Status: DC | PRN
  Administered 2019-04-29: 30 mL

## 2019-04-29 MED ORDER — MIDAZOLAM HCL 5 MG/5ML IJ SOLN
INTRAMUSCULAR | Status: DC | PRN
  Administered 2019-04-29 (×2): 1 mg via INTRAVENOUS

## 2019-04-29 MED ORDER — ONDANSETRON HCL 4 MG/2ML IJ SOLN
INTRAMUSCULAR | Status: DC | PRN
  Administered 2019-04-29: 4 mg via INTRAVENOUS

## 2019-04-29 MED ORDER — TRAZODONE HCL 50 MG PO TABS
50.0000 mg | ORAL_TABLET | Freq: Every day | ORAL | Status: DC
  Administered 2019-04-29: 50 mg via ORAL
  Filled 2019-04-29: qty 1

## 2019-04-29 MED ORDER — DEXAMETHASONE SODIUM PHOSPHATE 10 MG/ML IJ SOLN: 10.0000 mg | Freq: Once | INTRAMUSCULAR | Status: DC

## 2019-04-29 MED ORDER — ZOLPIDEM TARTRATE 5 MG PO TABS
5.0000 mg | ORAL_TABLET | Freq: Every evening | ORAL | Status: DC | PRN
  Administered 2019-04-29 – 2019-04-30 (×2): 5 mg via ORAL
  Filled 2019-04-29 (×2): qty 1

## 2019-04-29 MED ORDER — METHOCARBAMOL 500 MG PO TABS
500.0000 mg | ORAL_TABLET | Freq: Four times a day (QID) | ORAL | Status: DC | PRN
  Administered 2019-04-29: 500 mg via ORAL
  Filled 2019-04-29: qty 1

## 2019-04-29 MED ORDER — CEFAZOLIN SODIUM-DEXTROSE 2-4 GM/100ML-% IV SOLN
2.0000 g | INTRAVENOUS | Status: AC
  Administered 2019-04-29: 2 g via INTRAVENOUS

## 2019-04-29 MED ORDER — TRANEXAMIC ACID-NACL 1000-0.7 MG/100ML-% IV SOLN
1000.0000 mg | Freq: Once | INTRAVENOUS | Status: AC
  Administered 2019-04-29: 1000 mg via INTRAVENOUS
  Filled 2019-04-29: qty 100

## 2019-04-29 MED ORDER — DULOXETINE HCL 60 MG PO CPEP
60.0000 mg | ORAL_CAPSULE | Freq: Every day | ORAL | Status: DC
  Administered 2019-04-30: 60 mg via ORAL
  Filled 2019-04-29: qty 1

## 2019-04-29 MED ORDER — CHLORHEXIDINE GLUCONATE CLOTH 2 % EX PADS
6.0000 | MEDICATED_PAD | Freq: Every day | CUTANEOUS | Status: DC
  Administered 2019-04-29: 6 via TOPICAL

## 2019-04-29 MED ORDER — FENTANYL CITRATE (PF) 100 MCG/2ML IJ SOLN
INTRAMUSCULAR | Status: AC
  Filled 2019-04-29: qty 2

## 2019-04-29 MED ORDER — POVIDONE-IODINE 10 % EX SWAB
2.0000 "application " | Freq: Once | CUTANEOUS | Status: AC
  Administered 2019-04-29: 2 via TOPICAL

## 2019-04-29 MED ORDER — BUPIVACAINE IN DEXTROSE 0.75-8.25 % IT SOLN
INTRATHECAL | Status: DC | PRN
  Administered 2019-04-29: 1.6 mL via INTRATHECAL

## 2019-04-29 MED ORDER — ALBUMIN HUMAN 5 % IV SOLN
INTRAVENOUS | Status: AC
  Filled 2019-04-29: qty 250

## 2019-04-29 MED ORDER — FENTANYL CITRATE (PF) 100 MCG/2ML IJ SOLN
INTRAMUSCULAR | Status: DC | PRN
  Administered 2019-04-29 (×2): 50 ug via INTRAVENOUS

## 2019-04-29 MED ORDER — ACETAMINOPHEN 500 MG PO TABS
1000.0000 mg | ORAL_TABLET | Freq: Four times a day (QID) | ORAL | Status: AC
  Administered 2019-04-29 – 2019-04-30 (×4): 1000 mg via ORAL
  Filled 2019-04-29 (×4): qty 2

## 2019-04-29 MED ORDER — DEXAMETHASONE SODIUM PHOSPHATE 10 MG/ML IJ SOLN
10.0000 mg | Freq: Once | INTRAMUSCULAR | Status: DC
  Filled 2019-04-29: qty 1

## 2019-04-29 MED ORDER — POLYETHYLENE GLYCOL 3350 17 G PO PACK: 17.0000 g | PACK | Freq: Two times a day (BID) | ORAL | Status: DC

## 2019-04-29 MED ORDER — ROPIVACAINE HCL 5 MG/ML IJ SOLN
INTRAMUSCULAR | Status: DC | PRN
  Administered 2019-04-29: 30 mL via PERINEURAL

## 2019-04-29 MED ORDER — LIDOCAINE 2% (20 MG/ML) 5 ML SYRINGE
INTRAMUSCULAR | Status: DC | PRN
  Administered 2019-04-29: 100 mg via INTRAVENOUS

## 2019-04-29 MED ORDER — DIPHENHYDRAMINE HCL 12.5 MG/5ML PO ELIX: 12.5000 mg | ORAL_SOLUTION | ORAL | Status: DC | PRN

## 2019-04-29 MED ORDER — CEFAZOLIN SODIUM-DEXTROSE 2-4 GM/100ML-% IV SOLN
INTRAVENOUS | Status: AC
  Filled 2019-04-29: qty 100

## 2019-04-29 MED ORDER — METOPROLOL SUCCINATE ER 25 MG PO TB24
25.0000 mg | ORAL_TABLET | Freq: Two times a day (BID) | ORAL | Status: DC
  Administered 2019-04-30: 25 mg via ORAL
  Filled 2019-04-29: qty 1

## 2019-04-29 MED ORDER — TRANEXAMIC ACID-NACL 1000-0.7 MG/100ML-% IV SOLN
INTRAVENOUS | Status: AC
  Filled 2019-04-29: qty 100

## 2019-04-29 MED ORDER — CHLORHEXIDINE GLUCONATE 4 % EX LIQD: 60.0000 mL | Freq: Once | CUTANEOUS | Status: DC

## 2019-04-29 MED ORDER — HYDROMORPHONE HCL 1 MG/ML IJ SOLN: 0.5000 mg | INTRAMUSCULAR | Status: DC | PRN

## 2019-04-29 MED ORDER — PHENYLEPHRINE 40 MCG/ML (10ML) SYRINGE FOR IV PUSH (FOR BLOOD PRESSURE SUPPORT)
PREFILLED_SYRINGE | INTRAVENOUS | Status: DC | PRN
  Administered 2019-04-29 (×4): 80 ug via INTRAVENOUS

## 2019-04-29 MED ORDER — CEFAZOLIN SODIUM-DEXTROSE 2-4 GM/100ML-% IV SOLN
2.0000 g | Freq: Four times a day (QID) | INTRAVENOUS | Status: AC
  Administered 2019-04-29 (×2): 2 g via INTRAVENOUS
  Filled 2019-04-29 (×2): qty 100

## 2019-04-29 MED ORDER — DOCUSATE SODIUM 100 MG PO CAPS
100.0000 mg | ORAL_CAPSULE | Freq: Two times a day (BID) | ORAL | Status: DC
  Administered 2019-04-29 – 2019-04-30 (×2): 100 mg via ORAL
  Filled 2019-04-29 (×2): qty 1

## 2019-04-29 MED ORDER — FLUTICASONE FUROATE-VILANTEROL 100-25 MCG/INH IN AEPB
1.0000 | INHALATION_SPRAY | Freq: Every day | RESPIRATORY_TRACT | Status: DC
  Filled 2019-04-29: qty 28

## 2019-04-29 MED ORDER — FUROSEMIDE 20 MG PO TABS: 20.0000 mg | ORAL_TABLET | Freq: Every day | ORAL | Status: DC | PRN

## 2019-04-29 MED ORDER — ONDANSETRON HCL 4 MG/2ML IJ SOLN
INTRAMUSCULAR | Status: AC
  Filled 2019-04-29: qty 2

## 2019-04-29 MED ORDER — SODIUM CHLORIDE (PF) 0.9 % IJ SOLN
INTRAMUSCULAR | Status: AC
  Filled 2019-04-29: qty 50

## 2019-04-29 MED ORDER — ALBUMIN HUMAN 5 % IV SOLN
12.5000 g | Freq: Once | INTRAVENOUS | Status: AC
  Administered 2019-04-29: 12.5 g via INTRAVENOUS

## 2019-04-29 MED ORDER — MAGNESIUM CITRATE PO SOLN: 1.0000 | Freq: Once | ORAL | Status: DC | PRN

## 2019-04-29 MED ORDER — KETOROLAC TROMETHAMINE 30 MG/ML IJ SOLN
INTRAMUSCULAR | Status: DC | PRN
  Administered 2019-04-29: 30 mg

## 2019-04-29 MED ORDER — DEXAMETHASONE SODIUM PHOSPHATE 10 MG/ML IJ SOLN
INTRAMUSCULAR | Status: DC | PRN
  Administered 2019-04-29: 10 mg via INTRAVENOUS

## 2019-04-29 MED ORDER — PHENYLEPHRINE 40 MCG/ML (10ML) SYRINGE FOR IV PUSH (FOR BLOOD PRESSURE SUPPORT)
PREFILLED_SYRINGE | INTRAVENOUS | Status: AC
  Filled 2019-04-29: qty 10

## 2019-04-29 MED ORDER — SODIUM CHLORIDE 0.9 % IV SOLN: INTRAVENOUS | Status: DC

## 2019-04-29 MED ORDER — FERROUS SULFATE 325 (65 FE) MG PO TABS
325.0000 mg | ORAL_TABLET | Freq: Two times a day (BID) | ORAL | Status: DC
  Administered 2019-04-29 – 2019-04-30 (×2): 325 mg via ORAL
  Filled 2019-04-29 (×2): qty 1

## 2019-04-29 MED ORDER — SODIUM CHLORIDE (PF) 0.9 % IJ SOLN
INTRAMUSCULAR | Status: DC | PRN
  Administered 2019-04-29: 30 mL

## 2019-04-29 MED ORDER — TRANEXAMIC ACID-NACL 1000-0.7 MG/100ML-% IV SOLN
1000.0000 mg | INTRAVENOUS | Status: AC
  Administered 2019-04-29: 1000 mg via INTRAVENOUS

## 2019-04-29 MED ORDER — PHENYLEPHRINE HCL (PRESSORS) 10 MG/ML IV SOLN
INTRAVENOUS | Status: AC
  Filled 2019-04-29: qty 1

## 2019-04-29 MED ORDER — CALCITRIOL 0.5 MCG PO CAPS
0.5000 ug | ORAL_CAPSULE | Freq: Every day | ORAL | Status: DC
  Administered 2019-04-30: 0.5 ug via ORAL
  Filled 2019-04-29: qty 1

## 2019-04-29 MED ORDER — PROPOFOL 500 MG/50ML IV EMUL
INTRAVENOUS | Status: AC
  Filled 2019-04-29: qty 50

## 2019-04-29 MED ORDER — STERILE WATER FOR IRRIGATION IR SOLN
Status: DC | PRN
  Administered 2019-04-29: 2000 mL

## 2019-04-29 MED ORDER — PROPOFOL 10 MG/ML IV BOLUS
INTRAVENOUS | Status: AC
  Filled 2019-04-29: qty 20

## 2019-04-29 MED ORDER — FENTANYL CITRATE (PF) 100 MCG/2ML IJ SOLN: 25.0000 ug | INTRAMUSCULAR | Status: DC | PRN

## 2019-04-29 MED ORDER — BUPIVACAINE HCL (PF) 0.25 % IJ SOLN
INTRAMUSCULAR | Status: AC
  Filled 2019-04-29: qty 30

## 2019-04-29 MED ORDER — MIDAZOLAM HCL 2 MG/2ML IJ SOLN
INTRAMUSCULAR | Status: AC
  Filled 2019-04-29: qty 2

## 2019-04-29 MED ORDER — ONDANSETRON HCL 4 MG PO TABS: 4.0000 mg | ORAL_TABLET | Freq: Four times a day (QID) | ORAL | Status: DC | PRN

## 2019-04-29 MED ORDER — CELECOXIB 200 MG PO CAPS
200.0000 mg | ORAL_CAPSULE | Freq: Two times a day (BID) | ORAL | Status: DC
  Administered 2019-04-29 – 2019-04-30 (×2): 200 mg via ORAL
  Filled 2019-04-29 (×2): qty 1

## 2019-04-29 MED ORDER — HYDROMORPHONE HCL 2 MG PO TABS
2.0000 mg | ORAL_TABLET | ORAL | Status: DC | PRN
  Administered 2019-04-29 (×2): 2 mg via ORAL
  Administered 2019-04-29 – 2019-04-30 (×2): 4 mg via ORAL
  Filled 2019-04-29 (×2): qty 1
  Filled 2019-04-29 (×2): qty 2

## 2019-04-29 MED ORDER — MIRABEGRON ER 25 MG PO TB24
50.0000 mg | ORAL_TABLET | Freq: Every evening | ORAL | Status: DC | PRN
  Filled 2019-04-29: qty 2

## 2019-04-29 MED ORDER — LACTATED RINGERS IV SOLN: INTRAVENOUS | Status: DC

## 2019-04-29 SURGICAL SUPPLY — 61 items
ATTUNE MED ANAT PAT 38 KNEE (Knees) ×2 IMPLANT
ATTUNE PS FEM RT SZ 5 CEM KNEE (Femur) ×2 IMPLANT
ATTUNE PSRP INSE SZ5 7 KNEE (Insert) ×2 IMPLANT
BAG ZIPLOCK 12X15 (MISCELLANEOUS) IMPLANT
BASE TIBIAL ROT PLAT SZ 5 KNEE (Knees) ×1 IMPLANT
BLADE SAW SGTL 11.0X1.19X90.0M (BLADE) IMPLANT
BLADE SAW SGTL 13.0X1.19X90.0M (BLADE) ×2 IMPLANT
BLADE SURG SZ10 CARB STEEL (BLADE) ×4 IMPLANT
BNDG ELASTIC 6X5.8 VLCR STR LF (GAUZE/BANDAGES/DRESSINGS) ×2 IMPLANT
BOWL SMART MIX CTS (DISPOSABLE) ×2 IMPLANT
CEMENT HV SMART SET (Cement) ×4 IMPLANT
COVER SURGICAL LIGHT HANDLE (MISCELLANEOUS) ×2 IMPLANT
COVER WAND RF STERILE (DRAPES) IMPLANT
CUFF TOURN SGL QUICK 34 (TOURNIQUET CUFF) ×1
CUFF TRNQT CYL 34X4.125X (TOURNIQUET CUFF) ×1 IMPLANT
DECANTER SPIKE VIAL GLASS SM (MISCELLANEOUS) ×4 IMPLANT
DERMABOND ADVANCED (GAUZE/BANDAGES/DRESSINGS) ×1
DERMABOND ADVANCED .7 DNX12 (GAUZE/BANDAGES/DRESSINGS) ×1 IMPLANT
DRAPE U-SHAPE 47X51 STRL (DRAPES) ×2 IMPLANT
DRESSING AQUACEL AG SP 3.5X10 (GAUZE/BANDAGES/DRESSINGS) ×1 IMPLANT
DRSG AQUACEL AG ADV 3.5X10 (GAUZE/BANDAGES/DRESSINGS) ×2 IMPLANT
DRSG AQUACEL AG SP 3.5X10 (GAUZE/BANDAGES/DRESSINGS) ×2
DURAPREP 26ML APPLICATOR (WOUND CARE) ×4 IMPLANT
ELECT REM PT RETURN 15FT ADLT (MISCELLANEOUS) ×2 IMPLANT
GLOVE BIO SURGEON STRL SZ 6 (GLOVE) ×2 IMPLANT
GLOVE BIOGEL PI IND STRL 6.5 (GLOVE) ×1 IMPLANT
GLOVE BIOGEL PI IND STRL 7.5 (GLOVE) ×1 IMPLANT
GLOVE BIOGEL PI IND STRL 8.5 (GLOVE) ×1 IMPLANT
GLOVE BIOGEL PI INDICATOR 6.5 (GLOVE) ×1
GLOVE BIOGEL PI INDICATOR 7.5 (GLOVE) ×1
GLOVE BIOGEL PI INDICATOR 8.5 (GLOVE) ×1
GLOVE ECLIPSE 8.0 STRL XLNG CF (GLOVE) ×2 IMPLANT
GLOVE ORTHO TXT STRL SZ7.5 (GLOVE) ×2 IMPLANT
GOWN STRL REUS W/ TWL LRG LVL3 (GOWN DISPOSABLE) ×1 IMPLANT
GOWN STRL REUS W/TWL 2XL LVL3 (GOWN DISPOSABLE) ×2 IMPLANT
GOWN STRL REUS W/TWL LRG LVL3 (GOWN DISPOSABLE) ×3 IMPLANT
HANDPIECE INTERPULSE COAX TIP (DISPOSABLE) ×1
HOLDER FOLEY CATH W/STRAP (MISCELLANEOUS) IMPLANT
KIT TURNOVER KIT A (KITS) IMPLANT
MANIFOLD NEPTUNE II (INSTRUMENTS) ×2 IMPLANT
NDL SAFETY ECLIPSE 18X1.5 (NEEDLE) IMPLANT
NEEDLE HYPO 18GX1.5 SHARP (NEEDLE)
NS IRRIG 1000ML POUR BTL (IV SOLUTION) ×2 IMPLANT
PACK TOTAL KNEE CUSTOM (KITS) ×2 IMPLANT
PENCIL SMOKE EVACUATOR (MISCELLANEOUS) IMPLANT
PIN DRILL FIX HALF THREAD (BIT) ×2 IMPLANT
PIN FIX SIGMA LCS THRD HI (PIN) ×2 IMPLANT
PROTECTOR NERVE ULNAR (MISCELLANEOUS) ×2 IMPLANT
SET HNDPC FAN SPRY TIP SCT (DISPOSABLE) ×1 IMPLANT
SET PAD KNEE POSITIONER (MISCELLANEOUS) ×2 IMPLANT
SUT MNCRL AB 4-0 PS2 18 (SUTURE) ×2 IMPLANT
SUT STRATAFIX PDS+ 0 24IN (SUTURE) ×2 IMPLANT
SUT VIC AB 1 CT1 36 (SUTURE) ×2 IMPLANT
SUT VIC AB 2-0 CT1 27 (SUTURE) ×3
SUT VIC AB 2-0 CT1 TAPERPNT 27 (SUTURE) ×3 IMPLANT
SYR 3ML LL SCALE MARK (SYRINGE) ×2 IMPLANT
TIBIAL BASE ROT PLAT SZ 5 KNEE (Knees) ×2 IMPLANT
TRAY FOLEY MTR SLVR 16FR STAT (SET/KITS/TRAYS/PACK) ×2 IMPLANT
WATER STERILE IRR 1000ML POUR (IV SOLUTION) ×4 IMPLANT
WRAP KNEE MAXI GEL POST OP (GAUZE/BANDAGES/DRESSINGS) ×2 IMPLANT
YANKAUER SUCT BULB TIP 10FT TU (MISCELLANEOUS) ×2 IMPLANT

## 2019-04-29 NOTE — Evaluation (Signed)
Physical Therapy Evaluation Patient Details Name: Jillian Eaton MRN: [id number] DOB: 17-Jul-1950 Today's Date: 04/29/2019   History of Present Illness  69 yo female s/p R TKA 04/29/19. Hx of L TKA 2020, RTC tear, obesity, Afib, vertigo  Clinical Impression  On eval POD 0, pt was Min guard assist for mobility. She walked ~160 feet with a RW. Moderate pain with activity. Plan is for d/c home possibly tomorrow if meeting PT goals.     Follow Up Recommendations Follow surgeon's recommendation for DC plan and follow-up therapies    Equipment Recommendations  None recommended by PT    Recommendations for Other Services       Precautions / Restrictions Precautions Precautions: Fall;Knee Restrictions Weight Bearing Restrictions: No RLE Weight Bearing: Weight bearing as tolerated      Mobility  Bed Mobility Overal bed mobility: Needs Assistance Bed Mobility: Supine to Sit;Sit to Supine     Supine to sit: Min guard;HOB elevated Sit to supine: Min guard;HOB elevated      Transfers Overall transfer level: Needs assistance Equipment used: Rolling walker (2 wheeled) Transfers: Sit to/from Stand Sit to Stand: Min guard            Ambulation/Gait Ambulation/Gait assistance: Counsellor (Feet): 160 Feet Assistive device: Rolling walker (2 wheeled) Gait Pattern/deviations: Step-through pattern;Decreased stride length        Stairs            Wheelchair Mobility    Modified Rankin (Stroke Patients Only)       Balance Overall balance assessment: Mild deficits observed, not formally tested                                           Pertinent Vitals/Pain Pain Assessment: 0-10 Pain Score: 6  Pain Location: R knee Pain Descriptors / Indicators: Discomfort;Sore Pain Intervention(s): Monitored during session;Ice applied;Repositioned    Home Living Family/patient expects to be discharged to:: Private residence Living  Arrangements: Spouse/significant other Available Help at Discharge: Family;Available 24 hours/day Type of Home: Apartment Home Access: Level entry;Elevator     Home Layout: One level Home Equipment: Walker - 2 wheels;Cane - single point;Shower seat;Grab bars - tub/shower;Grab bars - toilet Additional Comments: spouse has some health issues and is unable to do heavy lifting; pt is his primary caregiver, he can mobilize independently and does not require physical assistance    Prior Function Level of Independence: Independent with assistive device(s)         Comments: using cane for ambulation     Hand Dominance        Extremity/Trunk Assessment   Upper Extremity Assessment Upper Extremity Assessment: Overall WFL for tasks assessed    Lower Extremity Assessment Lower Extremity Assessment: Generalized weakness    Cervical / Trunk Assessment Cervical / Trunk Assessment: Normal  Communication   Communication: No difficulties  Cognition Arousal/Alertness: Awake/alert Behavior During Therapy: WFL for tasks assessed/performed Overall Cognitive Status: Within Functional Limits for tasks assessed                                        General Comments      Exercises     Assessment/Plan    PT Assessment Patient needs continued PT services  PT Problem List Decreased strength;Decreased range of  motion;Decreased mobility;Decreased balance;Decreased activity tolerance;Decreased knowledge of use of DME;Pain       PT Treatment Interventions DME instruction;Gait training;Therapeutic activities;Therapeutic exercise;Patient/family education;Functional mobility training;Balance training    PT Goals (Current goals can be found in the Care Plan section)  Acute Rehab PT Goals Patient Stated Goal: regain independence PT Goal Formulation: With patient Time For Goal Achievement: 05/13/19 Potential to Achieve Goals: Good    Frequency 7X/week   Barriers to  discharge        Co-evaluation               AM-PAC PT "6 Clicks" Mobility  Outcome Measure Help needed turning from your back to your side while in a flat bed without using bedrails?: A Little Help needed moving from lying on your back to sitting on the side of a flat bed without using bedrails?: A Little Help needed moving to and from a bed to a chair (including a wheelchair)?: A Little Help needed standing up from a chair using your arms (e.g., wheelchair or bedside chair)?: A Little Help needed to walk in hospital room?: A Little Help needed climbing 3-5 steps with a railing? : A Little 6 Click Score: 18    End of Session Equipment Utilized During Treatment: Gait belt Activity Tolerance: Patient tolerated treatment well Patient left: in bed;with call bell/phone within reach   PT Visit Diagnosis: Pain;Other abnormalities of gait and mobility (R26.89) Pain - Right/Left: Right Pain - part of body: Knee    Time: 1700-1727 PT Time Calculation (min) (ACUTE ONLY): 27 min   Charges:   PT Evaluation $PT Eval Low Complexity: 1 Low PT Treatments $Gait Training: 8-22 mins          Doreatha Massed, PT Acute Rehabilitation

## 2019-04-29 NOTE — Anesthesia Procedure Notes (Signed)
Date/Time: 04/29/2019 6:50 AM Performed by: Sharlette Dense, CRNA Oxygen Delivery Method: Nasal cannula

## 2019-04-29 NOTE — Plan of Care (Signed)
  Problem: Education: Goal: Knowledge of General Education information will improve Description: Including pain rating scale, medication(s)/side effects and non-pharmacologic comfort measures Outcome: Progressing   Problem: Health Behavior/Discharge Planning: Goal: Ability to manage health-related needs will improve Outcome: Progressing   Problem: Clinical Measurements: Goal: Ability to maintain clinical measurements within normal limits will improve Outcome: Progressing Goal: Will remain free from infection Outcome: Progressing Goal: Diagnostic test results will improve Outcome: Progressing Goal: Respiratory complications will improve Outcome: Progressing Goal: Cardiovascular complication will be avoided Outcome: Progressing   Problem: Activity: Goal: Risk for activity intolerance will decrease Outcome: Progressing   Problem: Nutrition: Goal: Adequate nutrition will be maintained Outcome: Progressing   Problem: Coping: Goal: Level of anxiety will decrease Outcome: Progressing   Problem: Elimination: Goal: Will not experience complications related to bowel motility Outcome: Progressing Goal: Will not experience complications related to urinary retention Outcome: Progressing   Problem: Coping: Goal: Level of anxiety will decrease Outcome: Progressing   Problem: Pain Managment: Goal: General experience of comfort will improve Outcome: Progressing   Problem: Safety: Goal: Ability to remain free from injury will improve Outcome: Progressing   Problem: Skin Integrity: Goal: Risk for impaired skin integrity will decrease Outcome: Progressing   Problem: Education: Goal: Knowledge of the prescribed therapeutic regimen will improve Outcome: Progressing Goal: Individualized Educational Video(s) Outcome: Progressing   Problem: Activity: Goal: Ability to avoid complications of mobility impairment will improve Outcome: Progressing Goal: Range of joint motion will  improve Outcome: Progressing   Problem: Clinical Measurements: Goal: Postoperative complications will be avoided or minimized Outcome: Progressing   Problem: Pain Management: Goal: Pain level will decrease with appropriate interventions Outcome: Progressing   Problem: Skin Integrity: Goal: Will show signs of wound healing Outcome: Progressing

## 2019-04-29 NOTE — Anesthesia Procedure Notes (Signed)
Anesthesia Regional Block: Adductor canal block   Pre-Anesthetic Checklist: ,, timeout performed, Correct Patient, Correct Site, Correct Laterality, Correct Procedure,, site marked, risks and benefits discussed, Surgical consent,  Pre-op evaluation,  At surgeon's request and post-op pain management  Laterality: Right  Prep: chloraprep       Needles:  Injection technique: Single-shot  Needle Type: Echogenic Stimulator Needle     Needle Length: 10cm  Needle Gauge: 21     Additional Needles:   Procedures:,,,, ultrasound used (permanent image in chart),,,,  Narrative:  Start time: 04/29/2019 7:10 AM End time: 04/29/2019 7:20 AM Injection made incrementally with aspirations every 5 mL.  Performed by: Personally  Anesthesiologist: Murvin Natal, MD  Additional Notes: Functioning IV was confirmed and monitors were applied. A time-out was performed. Hand hygiene and sterile gloves were used. The thigh was placed in a frog-leg position and prepped in a sterile fashion. A 171mm 21ga [place] echogenic stimulator needle was placed using ultrasound guidance.  Negative aspiration and negative test dose prior to incremental administration of local anesthetic. The patient tolerated the procedure well.

## 2019-04-29 NOTE — Discharge Instructions (Signed)

## 2019-04-29 NOTE — Anesthesia Procedure Notes (Signed)
Date/Time: 04/29/2019 7:25 AM Performed by: Sharlette Dense, CRNA Oxygen Delivery Method: Simple face mask

## 2019-04-29 NOTE — Anesthesia Procedure Notes (Signed)
Spinal  Patient location during procedure: OR Start time: 04/29/2019 7:25 AM End time: 04/29/2019 7:35 AM Staffing Performed: anesthesiologist  Anesthesiologist: Murvin Natal, MD Preanesthetic Checklist Completed: patient identified, IV checked, risks and benefits discussed, surgical consent, monitors and equipment checked, pre-op evaluation and timeout performed Spinal Block Patient position: sitting Prep: DuraPrep Patient monitoring: cardiac monitor, continuous pulse ox and blood pressure Approach: midline Location: L4-5 Injection technique: single-shot Needle Needle type: Pencan  Needle gauge: 24 G Needle length: 9 cm Assessment Sensory level: T10 Additional Notes Functioning IV was confirmed and monitors were applied. Sterile prep and drape, including hand hygiene and sterile gloves were used. The patient was positioned and the spine was prepped. The skin was anesthetized with lidocaine.  Free flow of clear CSF was obtained prior to injecting local anesthetic into the CSF.  The spinal needle aspirated freely following injection.  The needle was carefully withdrawn.  The patient tolerated the procedure well.

## 2019-04-29 NOTE — Transfer of Care (Signed)
Immediate Anesthesia Transfer of Care Note  Patient: Jillian Eaton  Procedure(s) Performed: TOTAL KNEE ARTHROPLASTY (Right Knee)  Patient Location: PACU  Anesthesia Type:Spinal  Level of Consciousness: awake and alert   Airway & Oxygen Therapy: Patient Spontanous Breathing and Patient connected to face mask oxygen  Post-op Assessment: Report given to RN and Post -op Vital signs reviewed and stable  Post vital signs: Reviewed and stable  Last Vitals:  Vitals Value Taken Time  BP 103/58 04/29/19 0935  Temp    Pulse 82 04/29/19 0935  Resp 17 04/29/19 0936  SpO2 100 % 04/29/19 0935  Vitals shown include unvalidated device data.  Last Pain:  Vitals:   04/29/19 0559  TempSrc:   PainSc: 5       Patients Stated Pain Goal: 3 ([id number])  Complications: No apparent anesthesia complications

## 2019-04-29 NOTE — Care Plan (Signed)
Ortho Bundle Case Management Note  Patient Details  Name: Jillian Eaton MRN: [id number] Date of Birth: 08-22-1950  R TKA on 04-29-19 DCP:  Home with spouse.  1 story home with 0 ste. DME:  No needs.  Has a RW and elevated toilets. PT:  [facility].  PT eval scheduled on 05-02-19 at 9:30 am                  DME Arranged:  N/A DME Agency:  NA  HH Arranged:  NA HH Agency:  NA  Additional Comments: Please contact me with any questions of if this plan should need to change.  Marianne Sofia, RN,CCM [facility]  [phone] 04/29/2019, 11:13 AM

## 2019-04-29 NOTE — Anesthesia Postprocedure Evaluation (Signed)
Anesthesia Post Note  Patient: Jillian Eaton  Procedure(s) Performed: TOTAL KNEE ARTHROPLASTY (Right Knee)     Patient location during evaluation: PACU Anesthesia Type: Regional and Spinal Level of consciousness: oriented and awake and alert Pain management: pain level controlled Vital Signs Assessment: post-procedure vital signs reviewed and stable Respiratory status: spontaneous breathing, respiratory function stable and patient connected to nasal cannula oxygen Cardiovascular status: blood pressure returned to baseline and stable Postop Assessment: no headache, no backache, no apparent nausea or vomiting and spinal receding Anesthetic complications: no    Last Vitals:  Vitals:   04/29/19 1332 04/29/19 1529  BP: 90/63 102/75  Pulse: 70 74  Resp: 14 17  Temp: (!) 36.4 C 36.6 C  SpO2: 98% 100%    Last Pain:  Vitals:   04/29/19 1550  TempSrc:   PainSc: 7                  Ellen Mayol P Libra Gatz

## 2019-04-29 NOTE — Interval H&P Note (Signed)
History and Physical Interval Note:  04/29/2019 7:01 AM  Jillian Eaton  has presented today for surgery, with the diagnosis of Right knee osteoarthritis.  The various methods of treatment have been discussed with the patient and family. After consideration of risks, benefits and other options for treatment, the patient has consented to  Procedure(s) with comments: TOTAL KNEE ARTHROPLASTY (Right) - 70 mins as a surgical intervention.  The patient's history has been reviewed, patient examined, no change in status, stable for surgery.  I have reviewed the patient's chart and labs.  Questions were answered to the patient's satisfaction.     Mauri Pole

## 2019-04-29 NOTE — Op Note (Signed)
NAME:  Jillian Eaton [facility]                      MEDICAL RECORD NO.:  [id number]                             FACILITY:  [facility]      PHYSICIAN:  Pietro Cassis. Alvan Dame, M.D.  DATE OF BIRTH:  Nov 12, 1950      DATE OF PROCEDURE:  04/29/2019                                     OPERATIVE REPORT         PREOPERATIVE DIAGNOSIS:  Right knee osteoarthritis.      POSTOPERATIVE DIAGNOSIS:  Right knee osteoarthritis.      FINDINGS:  The patient was noted to have complete loss of cartilage and   bone-on-bone arthritis with associated osteophytes in all three compartments of   the knee with a significant synovitis and associated effusion.  The patient had failed months of conservative treatment including medications, injection therapy, activity modification.     PROCEDURE:  Right total knee replacement.      COMPONENTS USED:  DePuy Attune rotating platform posterior stabilized knee   system, a size 5 femur, 5 tibia, size 7 mm PS AOX insert, and 38 anatomic patellar   button.      SURGEON:  Pietro Cassis. Alvan Dame, M.D.      ASSISTANT:  Griffith Citron, PA-C.      ANESTHESIA:  Regional and Spinal.      SPECIMENS:  None.      COMPLICATION:  None.      DRAINS:  None.  EBL: <100cc      TOURNIQUET TIME:   Total Tourniquet Time Documented: Thigh (Right) - 42 minutes Total: Thigh (Right) - 42 minutes  .      The patient was stable to the recovery room.      INDICATION FOR PROCEDURE:  Jillian Eaton is a 69 y.o. female patient of   mine.  The patient had been seen, evaluated, and treated for months conservatively in the   office with medication, activity modification, and injections.  The patient had   radiographic changes of bone-on-bone arthritis with endplate sclerosis and osteophytes noted.  Based on the radiographic changes and failed conservative measures, the patient   decided to proceed with definitive treatment, total knee replacement.  Risks of infection, DVT, component failure,  need for revision surgery, neurovascular injury were reviewed in the office setting.  The postop course was reviewed stressing the efforts to maximize post-operative satisfaction and function.  Consent was obtained for benefit of pain   relief.      PROCEDURE IN DETAIL:  The patient was brought to the operative theater.   Once adequate anesthesia, preoperative antibiotics, 2 gm of Ancef,1 gm of Tranexamic Acid, and 10 mg of Decadron administered, the patient was positioned supine with a right thigh tourniquet placed.  The  right lower extremity was prepped and draped in sterile fashion.  A time-   out was performed identifying the patient, planned procedure, and the appropriate extremity.      The right lower extremity was placed in the [facility] leg holder.  The leg was   exsanguinated, tourniquet elevated to 250 mmHg.  A midline incision was   made followed by  median parapatellar arthrotomy.  Following initial   exposure, attention was first directed to the patella.  Precut   measurement was noted to be 24 mm.  I resected down to 14 mm and used a   38 anatomic patellar button to restore patellar height as well as cover the cut surface.      The lug holes were drilled and a metal shim was placed to protect the   patella from retractors and saw blade during the procedure.      At this point, attention was now directed to the femur.  The femoral   canal was opened with a drill, irrigated to try to prevent fat emboli.  An   intramedullary rod was passed at 3 degrees valgus, 11 mm of bone was   resected off the distal femur.  Following this resection, the tibia was   subluxated anteriorly.  Using the extramedullary guide, 2 mm of bone was resected off   the proximal media tibia.  We confirmed the gap would be   stable medially and laterally with a size 6 spacer block as well as confirmed that the tibial cut was perpendicular in the coronal plane, checking with an alignment rod.      Once this was  done, I sized the femur to be a size 5 in the anterior-   posterior dimension, chose a standard component based on medial and   lateral dimension.  The size 5 rotation block was then pinned in   position anterior referenced using the C-clamp to set rotation.  The   anterior, posterior, and  chamfer cuts were made without difficulty nor   notching making certain that I was along the anterior cortex to help   with flexion gap stability.  Using a laminar spreader I removed posterior medial greater than lateral osteophytes,     The final box cut was made off the lateral aspect of distal femur.      At this point, the tibia was sized to be a size 5.  The size 5 tray was   then pinned in position through the medial third of the tubercle,   drilled, and keel punched.  Trial reduction was now carried with a 5 femur,  5 tibia, a size 7 mm PS insert, and the 38 anatomic patella botton.  The knee was brought to full extension with good flexion stability with the patella   tracking through the trochlea without application of pressure.  Given   all these findings the trial components removed.  Final components were   opened and cement was mixed.  The knee was irrigated with normal saline solution and pulse lavage.  The synovial lining was   then injected with 30 cc of 0.25% Marcaine with epinephrine, 1 cc of Toradol and 30 cc of NS for a total of 61 cc.     Final implants were then cemented onto cleaned and dried cut surfaces of bone with the knee brought to extension with a size 7 mm PS trial insert.      Once the cement had fully cured, excess cement was removed   throughout the knee.  I confirmed that I was satisfied with the range of   motion and stability, and the final size 7 mm PS AOX insert was chosen.  It was   placed into the knee.      The tourniquet had been let down at 42 minutes.  No significant   hemostasis was required.  The extensor mechanism was then reapproximated using #1 Vicryl and  #1 Stratafix sutures with the knee   in flexion.  The   remaining wound was closed with 2-0 Vicryl and running 4-0 Monocryl.   The knee was cleaned, dried, dressed sterilely using Dermabond and   Aquacel dressing.  The patient was then   brought to recovery room in stable condition, tolerating the procedure   well.   Please note that Physician Assistant, Griffith Citron, PA-C was present for the entirety of the case, and was utilized for pre-operative positioning, peri-operative retractor management, general facilitation of the procedure and for primary wound closure at the end of the case.              Pietro Cassis Alvan Dame, M.D.    04/29/2019 9:08 AM

## 2019-04-30 ENCOUNTER — Encounter: Payer: Self-pay | Admitting: *Deleted

## 2019-04-30 DIAGNOSIS — I5032 Chronic diastolic (congestive) heart failure: Secondary | ICD-10-CM | POA: Diagnosis not present

## 2019-04-30 DIAGNOSIS — M25461 Effusion, right knee: Secondary | ICD-10-CM | POA: Diagnosis not present

## 2019-04-30 DIAGNOSIS — M1711 Unilateral primary osteoarthritis, right knee: Secondary | ICD-10-CM | POA: Diagnosis not present

## 2019-04-30 DIAGNOSIS — Z96653 Presence of artificial knee joint, bilateral: Secondary | ICD-10-CM | POA: Diagnosis not present

## 2019-04-30 DIAGNOSIS — I130 Hypertensive heart and chronic kidney disease with heart failure and stage 1 through stage 4 chronic kidney disease, or unspecified chronic kidney disease: Secondary | ICD-10-CM | POA: Diagnosis not present

## 2019-04-30 DIAGNOSIS — M659 Synovitis and tenosynovitis, unspecified: Secondary | ICD-10-CM | POA: Diagnosis not present

## 2019-04-30 LAB — BASIC METABOLIC PANEL
Anion gap: 8 (ref 5–15)
BUN: 33 mg/dL — ABNORMAL HIGH (ref 8–23)
CO2: 24 mmol/L (ref 22–32)
Calcium: 7.7 mg/dL — ABNORMAL LOW (ref 8.9–10.3)
Chloride: 105 mmol/L (ref 98–111)
Creatinine, Ser: 1.35 mg/dL — ABNORMAL HIGH (ref 0.44–1.00)
GFR calc Af Amer: 47 mL/min — ABNORMAL LOW (ref >60)
GFR calc non Af Amer: 41 mL/min — ABNORMAL LOW (ref >60)
Glucose, Bld: 93 mg/dL (ref 70–99)
Potassium: 4.6 mmol/L (ref 3.5–5.1)
Sodium: 137 mmol/L (ref 135–145)

## 2019-04-30 LAB — CBC
HCT: 25.9 % — ABNORMAL LOW (ref 36.0–46.0)
Hemoglobin: 7.6 g/dL — ABNORMAL LOW (ref 12.0–15.0)
MCH: 28.4 pg (ref 26.0–34.0)
MCHC: 29.3 g/dL — ABNORMAL LOW (ref 30.0–36.0)
MCV: 96.6 fL (ref 80.0–100.0)
Platelets: 160 10*3/uL (ref 150–400)
RBC: 2.68 MIL/uL — ABNORMAL LOW (ref 3.87–5.11)
RDW: 16.9 % — ABNORMAL HIGH (ref 11.5–15.5)
WBC: 11 10*3/uL — ABNORMAL HIGH (ref 4.0–10.5)
nRBC: 0 % (ref 0.0–0.2)

## 2019-04-30 LAB — HEMOGLOBIN AND HEMATOCRIT, BLOOD
HCT: 27.2 % — ABNORMAL LOW (ref 36.0–46.0)
Hemoglobin: 8.7 g/dL — ABNORMAL LOW (ref 12.0–15.0)

## 2019-04-30 MED ORDER — HYDROMORPHONE HCL 2 MG PO TABS: 2.0000 mg | ORAL_TABLET | ORAL | 0 refills | Status: DC | PRN

## 2019-04-30 MED ORDER — POLYETHYLENE GLYCOL 3350 17 G PO PACK: 17.0000 g | PACK | Freq: Two times a day (BID) | ORAL | 0 refills | Status: DC

## 2019-04-30 MED ORDER — DOCUSATE SODIUM 100 MG PO CAPS: 100.0000 mg | ORAL_CAPSULE | Freq: Two times a day (BID) | ORAL | 0 refills | Status: DC

## 2019-04-30 MED ORDER — ACETAMINOPHEN 500 MG PO TABS
1000.0000 mg | ORAL_TABLET | Freq: Four times a day (QID) | ORAL | Status: DC | PRN
  Administered 2019-04-30: 1000 mg via ORAL
  Filled 2019-04-30: qty 2

## 2019-04-30 MED ORDER — FERROUS SULFATE 325 (65 FE) MG PO TABS: 325.0000 mg | ORAL_TABLET | Freq: Three times a day (TID) | ORAL | 0 refills | Status: DC

## 2019-04-30 MED ORDER — ACETAMINOPHEN 500 MG PO TABS: 1000.0000 mg | ORAL_TABLET | Freq: Three times a day (TID) | ORAL | 0 refills | Status: DC

## 2019-04-30 NOTE — Progress Notes (Signed)
Physical Therapy Treatment Patient Details Name: Jillian Eaton MRN: [id number] DOB: [birth year] Today's Date: 04/30/2019    History of Present Illness 68 yo female s/p R TKA 04/29/19. Hx of L TKA 2020, RTC tear, obesity, Afib, vertigo    PT Comments    Progressing well. Pt tolerated session well. No c/o dizziness/lightheadedness. O2 97% on RA, dyspnea 2/4. Pt stated she was unsure if she was going to d/c today (low hgb?) so will plan to have a 2nd session prior to possible d/c later today.    Follow Up Recommendations  Follow surgeon's recommendation for DC plan and follow-up therapies     Equipment Recommendations  None recommended by PT    Recommendations for Other Services       Precautions / Restrictions Precautions Precautions: Fall;Knee Restrictions Weight Bearing Restrictions: No RLE Weight Bearing: Weight bearing as tolerated    Mobility  Bed Mobility               General bed mobility comments: oob in recliner  Transfers Overall transfer level: Needs assistance Equipment used: Rolling walker (2 wheeled) Transfers: Sit to/from Stand Sit to Stand: Supervision            Ambulation/Gait Ambulation/Gait assistance: Supervision Gait Distance (Feet): 500 Feet Assistive device: Rolling walker (2 wheeled) Gait Pattern/deviations: Step-through pattern     General Gait Details: O2 97% on RA, dyspnea 2/4. Pt tolerated distance well. No report of dizziness/lightheadedness   Stairs             Wheelchair Mobility    Modified Rankin (Stroke Patients Only)       Balance Overall balance assessment: Mild deficits observed, not formally tested                                          Cognition Arousal/Alertness: Awake/alert Behavior During Therapy: WFL for tasks assessed/performed Overall Cognitive Status: Within Functional Limits for tasks assessed                                         Exercises Total Joint Exercises Ankle Circles/Pumps: AROM;Both;10 reps;Seated Quad Sets: AROM;Both;10 reps;Seated Hip ABduction/ADduction: 10 reps;Seated;AROM;Right Long Arc Quad: AROM;Right;10 reps;Seated Knee Flexion: AROM;Right;Seated Goniometric ROM: ~5-95 degrees    General Comments        Pertinent Vitals/Pain Pain Assessment: 0-10 Pain Score: 6  Pain Location: R knee Pain Descriptors / Indicators: Discomfort;Sore Pain Intervention(s): Monitored during session;Ice applied    Home Living                      Prior Function            PT Goals (current goals can now be found in the care plan section) Progress towards PT goals: Progressing toward goals    Frequency    7X/week      PT Plan Current plan remains appropriate    Co-evaluation              AM-PAC PT "6 Clicks" Mobility   Outcome Measure  Help needed turning from your back to your side while in a flat bed without using bedrails?: A Little Help needed moving from lying on your back to sitting on the side of a flat bed without using bedrails?: A Little  Help needed moving to and from a bed to a chair (including a wheelchair)?: A Little Help needed standing up from a chair using your arms (e.g., wheelchair or bedside chair)?: A Little Help needed to walk in hospital room?: A Little Help needed climbing 3-5 steps with a railing? : A Little 6 Click Score: 18    End of Session Equipment Utilized During Treatment: Gait belt Activity Tolerance: Patient tolerated treatment well Patient left: in chair;with call bell/phone within reach   PT Visit Diagnosis: Pain;Other abnormalities of gait and mobility (R26.89) Pain - Right/Left: Right Pain - part of body: Knee     Time: [id number] PT Time Calculation (min) (ACUTE ONLY): 23 min  Charges:  $Gait Training: 8-22 mins $Therapeutic Exercise: 8-22 mins                         Doreatha Massed, PT Acute Rehabilitation

## 2019-04-30 NOTE — Progress Notes (Signed)
     Subjective: 1 Day Post-Op Procedure(s) (LRB): TOTAL KNEE ARTHROPLASTY (Right)   Patient reports pain as mild, pain controlled.  Dr. Alvan Dame saw the patient and discussed the procedure, findings and expectations moving forward.  Dr. Alvan Dame discussed with the patient that if she does well therapy she could be discharged home.  Discussed continue taking iron to help build her hemoglobin.   Patient's anticipated LOS is less than 2 midnights, meeting these requirements: - Younger than 78 - Lives within 1 hour of care - Has a competent adult at home to recover with post-op recover - NO history of  - Chronic pain requiring opiods  - Diabetes  - Coronary Artery Disease  - Heart failure  - Heart attack  - Stroke  - DVT/VTE  - Cardiac arrhythmia  - Respiratory Failure/COPD  - Advanced Liver disease      Objective:   VITALS:   Vitals:   04/30/19 0248 04/30/19 0600  BP: 93/61 97/62  Pulse: 88 80  Resp: 20 20  Temp: 98.7 F (37.1 C) 98.4 F (36.9 C)  SpO2: 100% 100%    Dorsiflexion/Plantar flexion intact Incision: dressing C/D/I No cellulitis present Compartment soft  LABS Recent Labs    04/30/19 0331  HGB 7.6*  HCT 25.9*  WBC 11.0*  PLT 160    Recent Labs    04/30/19 0331  NA 137  K 4.6  BUN 33*  CREATININE 1.35*  GLUCOSE 93     Assessment/Plan: 1 Day Post-Op Procedure(s) (LRB): TOTAL KNEE ARTHROPLASTY (Right) Foley cath DC'd Advance diet Up with therapy D/C IV fluids Discharge home Follow up in 2 weeks at [facility] Follow up with OLIN,Joslyn Ramos D in 2 weeks.  Contact information:  [facility] [address] [phone]    Obese (BMI 30-39.9) Estimated body mass index is 35.27 kg/m as calculated from the following:   Height as of this encounter: 5\' 6"  (1.676 m).   Weight as of this encounter: 99.1 kg. Patient also counseled that weight may inhibit the healing process Patient counseled that  losing weight will help with future health issues         Danae Orleans PA-C  [facility]  [place] [address], [address], [place], [place] [address] Phone: [phone] [email or web address] Facebook  [employer]

## 2019-04-30 NOTE — Progress Notes (Signed)
Physical Therapy Treatment Patient Details Name: Jillian Eaton MRN: [id number] DOB: [birth year] Today's Date: 04/30/2019    History of Present Illness 69 yo female s/p R TKA 04/29/19. Hx of L TKA 2020, RTC tear, obesity, Afib, vertigo    PT Comments    Progressing well with mobility. Issued HEP for pt to perform 2x/day until OP PT begins. All education completed. Okay to d/c from PT standpoint   Follow Up Recommendations  Follow surgeon's recommendation for DC plan and follow-up therapies     Equipment Recommendations  None recommended by PT    Recommendations for Other Services       Precautions / Restrictions Precautions Precautions: Fall;Knee Restrictions Weight Bearing Restrictions: No RLE Weight Bearing: Weight bearing as tolerated    Mobility  Bed Mobility               General bed mobility comments: oob in recliner  Transfers Overall transfer level: Needs assistance Equipment used: Rolling walker (2 wheeled) Transfers: Sit to/from Stand Sit to Stand: Supervision            Ambulation/Gait Ambulation/Gait assistance: Supervision Gait Distance (Feet): 500 Feet Assistive device: Rolling walker (2 wheeled) Gait Pattern/deviations: Step-through pattern     General Gait Details: O2 97% on RA, dyspnea 2/4. Pt tolerated distance well. No report of dizziness/lightheadedness   Stairs             Wheelchair Mobility    Modified Rankin (Stroke Patients Only)       Balance Overall balance assessment: Mild deficits observed, not formally tested                                          Cognition Arousal/Alertness: Awake/alert Behavior During Therapy: WFL for tasks assessed/performed Overall Cognitive Status: Within Functional Limits for tasks assessed                                        Exercises Total Joint Exercises Ankle Circles/Pumps: AROM;Both;10 reps;Seated Quad Sets: AROM;Both;10  reps;Seated Hip ABduction/ADduction: 10 reps;Seated;AROM;Right Long Arc Quad: AROM;Right;10 reps;Seated Knee Flexion: AROM;Right;Seated Goniometric ROM: ~5-95 degrees    General Comments        Pertinent Vitals/Pain Pain Assessment: 0-10 Pain Score: 6  Pain Location: R knee Pain Descriptors / Indicators: Discomfort;Sore Pain Intervention(s): Monitored during session;Repositioned    Home Living                      Prior Function            PT Goals (current goals can now be found in the care plan section) Progress towards PT goals: Progressing toward goals    Frequency    7X/week      PT Plan Current plan remains appropriate    Co-evaluation              AM-PAC PT "6 Clicks" Mobility   Outcome Measure  Help needed turning from your back to your side while in a flat bed without using bedrails?: A Little Help needed moving from lying on your back to sitting on the side of a flat bed without using bedrails?: A Little Help needed moving to and from a bed to a chair (including a wheelchair)?: A Little Help needed standing up from  a chair using your arms (e.g., wheelchair or bedside chair)?: A Little Help needed to walk in hospital room?: A Little Help needed climbing 3-5 steps with a railing? : A Little 6 Click Score: 18    End of Session Equipment Utilized During Treatment: Gait belt Activity Tolerance: Patient tolerated treatment well Patient left: in chair;with call bell/phone within reach   PT Visit Diagnosis: Pain;Other abnormalities of gait and mobility (R26.89) Pain - Right/Left: Right Pain - part of body: Knee     Time: 1320-1330 PT Time Calculation (min) (ACUTE ONLY): 10 min  Charges:  $Gait Training: 8-22 mins $Therapeutic Exercise: 8-22 mins                        Doreatha Massed, PT Acute Rehabilitation

## 2019-05-02 DIAGNOSIS — M25561 Pain in right knee: Secondary | ICD-10-CM | POA: Diagnosis not present

## 2019-05-05 DIAGNOSIS — M25561 Pain in right knee: Secondary | ICD-10-CM | POA: Diagnosis not present

## 2019-05-06 DIAGNOSIS — D509 Iron deficiency anemia, unspecified: Secondary | ICD-10-CM | POA: Diagnosis not present

## 2019-05-06 DIAGNOSIS — E539 Vitamin B deficiency, unspecified: Secondary | ICD-10-CM | POA: Diagnosis not present

## 2019-05-06 DIAGNOSIS — E559 Vitamin D deficiency, unspecified: Secondary | ICD-10-CM | POA: Diagnosis not present

## 2019-05-06 DIAGNOSIS — M25562 Pain in left knee: Secondary | ICD-10-CM | POA: Diagnosis not present

## 2019-05-06 NOTE — Discharge Summary (Signed)
Physician Discharge Summary  Patient ID: Jillian Eaton [facility] MRN: [id number] DOB/AGE: 08/21/1950 69 y.o.  Admit date: 04/29/2019 Discharge date: 04/30/2019   Procedures:  Procedure(s) (LRB): TOTAL KNEE ARTHROPLASTY (Right)  Attending Physician:  Dr. Durene Romans   Admission Diagnoses:   Right knee primary OA / pain  Discharge Diagnoses:  Principal Problem:   S/P right TKA Active Problems:   Obesity (BMI 30-39.9)  Past Medical History:  Diagnosis Date  . Anemia    iron and pernicious  . Arthritis   . Asthma    flare up last yr  . CHF (congestive heart failure) (HCC)   . Chronic headache    " better now "   . Complication of anesthesia    BP dropped   . DOE (dyspnea on exertion)    2D ECHO, 02/12/2012 - EF 60-65%, moderate concentric hypertrophy  . Dysrhythmia   . Fibromyalgia    nerve pain"left side at waist level" "can't lay on that side without pain" , "HOB elevation helps"; pt. thinks this has resolved after 200lb weight loss9-17- since i lost the wieght , i dont  think i have this anymore   . Heart murmur   . Hematuria - cause not known    resolved   . History of kidney stones    x 2 '13, '14 surgery to remove  . Hypertension   . Pneumonia    aspiration  04/2016  . SBO (small bowel obstruction) (HCC) 06/07/2013  . SBO (small bowel obstruction) (HCC)    rqueired admission 2020  . Thyroid disease    "goiter"  . Transfusion history    10 yrs+    HPI:    Jillian Eaton, 69 y.o. female, has a history of pain and functional disability in the right knee due to arthritis and has failed non-surgical conservative treatments for greater than 12 weeks to includeNSAID's and/or analgesics, corticosteriod injections, viscosupplementation injections and activity modification.  Onset of symptoms was gradual, starting 30 years ago with gradually worsening course since that time. The patient noted prior procedures on the knee to include  arthroplasty on the left  knee(s).  Patient currently rates pain in the right knee(s) at 10 out of 10 with activity. Patient has night pain, worsening of pain with activity and weight bearing, pain that interferes with activities of daily living, pain with passive range of motion, crepitus and joint swelling.  Patient has evidence of periarticular osteophytes and joint space narrowing by imaging studies. There is no active infection.  Risks, benefits and expectations were discussed with the patient.  Risks including but not limited to the risk of anesthesia, blood clots, nerve damage, blood vessel damage, failure of the prosthesis, infection and up to and including death.  Patient understand the risks, benefits and expectations and wishes to proceed with surgery.   PCP: Harvest Forest, MD   Discharged Condition: good  Hospital Course:  Patient underwent the above stated procedure on 04/29/2019. Patient tolerated the procedure well and brought to the recovery room in good condition and subsequently to the floor.  POD #1 BP: 97/62 ; Pulse: 80 ; Temp: 98.4 F (36.9 C) ; Resp: 20 Patient reports pain as mild, pain controlled.  Dr. Charlann Boxer saw the patient and discussed the procedure, findings and expectations moving forward.  Dr. Charlann Boxer discussed with the patient that if she does well therapy she could be discharged home.  Discussed continue taking iron to help build her hemoglobin. Dorsiflexion/plantar flexion intact, incision:  dressing C/D/I, no cellulitis present and compartment soft.   LABS  Basename    HGB     7.6  HCT     25.9    Discharge Exam: General appearance: alert, cooperative and no distress Extremities: Homans sign is negative, no sign of DVT, no edema, redness or tenderness in the calves or thighs and no ulcers, gangrene or trophic changes  Disposition: Home with follow up in 2 weeks   Follow-up Information    [facility] Go on 05/02/2019.   Why: You are scheduled for a physical therapy appointment  on 05-02-19 at 9:30 am. Contact information: [address] [place] [address] [phone]        Lanney Gins, PA-C. Go on 05/14/2019.   Specialty: Orthopedic Surgery Why: You are scheduled for a post-operative appointment on 05-14-19 at 3:30 pm.  Contact information: [address] [place] [address] [phone]           Discharge Instructions    Call MD / Call 911   Complete by: As directed    If you experience chest pain or shortness of breath, CALL 911 and be transported to the hospital emergency room.  If you develope a fever above 101 F, pus (white drainage) or increased drainage or redness at the wound, or calf pain, call your surgeon's office.   Change dressing   Complete by: As directed    Maintain surgical dressing until follow up in the clinic. If the edges start to pull up, may reinforce with tape. If the dressing is no longer working, may remove and cover with gauze and tape, but must keep the area dry and clean.  Call with any questions or concerns.   Constipation Prevention   Complete by: As directed    Drink plenty of fluids.  Prune juice may be helpful.  You may use a stool softener, such as Colace (over the counter) 100 mg twice a day.  Use MiraLax (over the counter) for constipation as needed.   Diet - low sodium heart healthy   Complete by: As directed    Discharge instructions   Complete by: As directed    Maintain surgical dressing until follow up in the clinic. If the edges start to pull up, may reinforce with tape. If the dressing is no longer working, may remove and cover with gauze and tape, but must keep the area dry and clean.  Follow up in 2 weeks at [facility]. Call with any questions or concerns.   Increase activity slowly as tolerated   Complete by: As directed    Weight bearing as tolerated with assist device (walker, cane, etc) as directed, use it as long as suggested by your surgeon or therapist,  typically at least 4-6 weeks.   TED hose   Complete by: As directed    Use stockings (TED hose) for 2 weeks on both leg(s).  You may remove them at night for sleeping.      Allergies as of 04/30/2019      Reactions   Hydrocodone Itching         Medication List    STOP taking these medications   acetaminophen 650 MG CR tablet Commonly known as: TYLENOL Replaced by: acetaminophen 500 MG tablet   traMADol 50 MG tablet Commonly known as: ULTRAM     TAKE these medications   acetaminophen 500 MG tablet Commonly known as: TYLENOL Take 2 tablets (1,000 mg total) by mouth  every 8 (eight) hours. Replaces: acetaminophen 650 MG CR tablet   albuterol 108 (90 Base) MCG/ACT inhaler Commonly known as: VENTOLIN HFA Inhale 2 puffs into the lungs every 6 (six) hours as needed for wheezing or shortness of breath.   Breo Ellipta 100-25 MCG/INH Aepb Generic drug: fluticasone furoate-vilanterol Inhale 1 puff into the lungs daily.   calcitRIOL 0.5 MCG capsule Commonly known as: ROCALTROL Take 0.5 mcg by mouth daily. What changed: Another medication with the same name was removed. Continue taking this medication, and follow the directions you see here.   clotrimazole 1 % cream Commonly known as: LOTRIMIN APPLY TO AFFECTED AREA TWICE A DAY What changed: See the new instructions.   cyanocobalamin 1000 MCG/ML injection Commonly known as: (VITAMIN B-12) Inject 1 mL (1,000 mcg total) into the muscle every 30 (thirty) days.   cyclobenzaprine 10 MG tablet Commonly known as: FLEXERIL Take 2 tablets (20 mg total) by mouth 2 (two) times daily. What changed:   how much to take  when to take this  reasons to take this   docusate sodium 100 MG capsule Commonly known as: Colace Take 1 capsule (100 mg total) by mouth 2 (two) times daily.   DULoxetine 60 MG capsule Commonly known as: CYMBALTA Take 1 capsule (60 mg total) by mouth daily.   Eliquis 5 MG Tabs tablet Generic drug:  apixaban TAKE 1 TABLET TWICE A DAY What changed: how much to take   eszopiclone 2 MG Tabs tablet Commonly known as: LUNESTA Take 1 tablet (2 mg total) by mouth at bedtime.   ferrous sulfate 325 (65 FE) MG tablet Commonly known as: FerrouSul Take 1 tablet (325 mg total) by mouth 3 (three) times daily with meals for 14 days.   furosemide 20 MG tablet Commonly known as: LASIX Take 1 tablet (20 mg total) by mouth as needed. What changed:   when to take this  reasons to take this   HYDROmorphone 2 MG tablet Commonly known as: Dilaudid Take 1-2 tablets (2-4 mg total) by mouth every 4 (four) hours as needed for severe pain.   levETIRAcetam 500 MG tablet Commonly known as: Keppra Take 1 tablet (500 mg total) by mouth 2 (two) times daily.   levothyroxine 150 MCG tablet Commonly known as: SYNTHROID TAKE 1 TABLET BY MOUTH EVERY DAY What changed: when to take this   magnesium oxide 400 MG tablet Commonly known as: MAG-OX Take 1 tablet (400 mg total) by mouth daily.   metoprolol succinate 25 MG 24 hr tablet Commonly known as: TOPROL-XL Take 25 mg by mouth 2 (two) times daily.   mirabegron ER 50 MG Tb24 tablet Commonly known as: Myrbetriq Take 1 tablet (50 mg total) by mouth daily. What changed:   when to take this  reasons to take this   NONFORMULARY OR COMPOUNDED ITEM [facility]:  Antifungal Cream - Terbinafine 3%, Fluconazole 2%, Tea Tree Oil 5%, Urea 10%, Ibuprofen 2% in DMSO Suspension #44ml. Apply to affected toenail(s) once daily (at bedtime) or twice daily. What changed:   how much to take  how to take this  when to take this   polyethylene glycol 17 g packet Commonly known as: MIRALAX / GLYCOLAX Take 17 g by mouth 2 (two) times daily. What changed:   when to take this  reasons to take this   traZODone 50 MG tablet Commonly known as: DESYREL Take 1 tablet (50 mg total) by mouth at bedtime.   Vitamin D (Ergocalciferol) 1.25 MG (50000  UNIT)  Caps capsule Commonly known as: DRISDOL Take 1 capsule (50,000 Units total) by mouth every Wednesday.            Discharge Care Instructions  (From admission, onward)         Start     Ordered   04/30/19 0000  Change dressing    Comments: Maintain surgical dressing until follow up in the clinic. If the edges start to pull up, may reinforce with tape. If the dressing is no longer working, may remove and cover with gauze and tape, but must keep the area dry and clean.  Call with any questions or concerns.   04/30/19 8413           Signed: Anastasio Auerbach. Zakarie Sturdivant   PA-C  05/06/2019, 10:56 AM

## 2019-05-07 ENCOUNTER — Emergency Department (HOSPITAL_COMMUNITY): Payer: Medicare Other

## 2019-05-07 ENCOUNTER — Emergency Department (HOSPITAL_COMMUNITY)
Admission: EM | Admit: 2019-05-07 | Discharge: 2019-05-07 | Disposition: A | Discharge status: Home / Self Care | Payer: Medicare Other | Attending: Emergency Medicine | Admitting: Emergency Medicine

## 2019-05-07 ENCOUNTER — Encounter (HOSPITAL_COMMUNITY): Payer: Self-pay | Admitting: Emergency Medicine

## 2019-05-07 DIAGNOSIS — E039 Hypothyroidism, unspecified: Secondary | ICD-10-CM | POA: Diagnosis not present

## 2019-05-07 DIAGNOSIS — Z7901 Long term (current) use of anticoagulants: Secondary | ICD-10-CM | POA: Diagnosis not present

## 2019-05-07 DIAGNOSIS — M542 Cervicalgia: Secondary | ICD-10-CM | POA: Insufficient documentation

## 2019-05-07 DIAGNOSIS — R569 Unspecified convulsions: Secondary | ICD-10-CM | POA: Diagnosis not present

## 2019-05-07 DIAGNOSIS — R519 Headache, unspecified: Secondary | ICD-10-CM | POA: Diagnosis not present

## 2019-05-07 DIAGNOSIS — Z96651 Presence of right artificial knee joint: Secondary | ICD-10-CM | POA: Diagnosis not present

## 2019-05-07 DIAGNOSIS — M5481 Occipital neuralgia: Secondary | ICD-10-CM | POA: Diagnosis not present

## 2019-05-07 DIAGNOSIS — J45909 Unspecified asthma, uncomplicated: Secondary | ICD-10-CM | POA: Insufficient documentation

## 2019-05-07 DIAGNOSIS — Z96652 Presence of left artificial knee joint: Secondary | ICD-10-CM | POA: Insufficient documentation

## 2019-05-07 DIAGNOSIS — I110 Hypertensive heart disease with heart failure: Secondary | ICD-10-CM | POA: Diagnosis not present

## 2019-05-07 DIAGNOSIS — Z96611 Presence of right artificial shoulder joint: Secondary | ICD-10-CM | POA: Diagnosis not present

## 2019-05-07 DIAGNOSIS — I5032 Chronic diastolic (congestive) heart failure: Secondary | ICD-10-CM | POA: Insufficient documentation

## 2019-05-07 DIAGNOSIS — Z79899 Other long term (current) drug therapy: Secondary | ICD-10-CM | POA: Insufficient documentation

## 2019-05-07 LAB — CBC WITH DIFFERENTIAL/PLATELET
Abs Immature Granulocytes: 0.02 10*3/uL (ref 0.00–0.07)
Basophils Absolute: 0.1 10*3/uL (ref 0.0–0.1)
Basophils Relative: 1 %
Eosinophils Absolute: 0.7 10*3/uL — ABNORMAL HIGH (ref 0.0–0.5)
Eosinophils Relative: 11 %
HCT: 28.5 % — ABNORMAL LOW (ref 36.0–46.0)
Hemoglobin: 8.5 g/dL — ABNORMAL LOW (ref 12.0–15.0)
Immature Granulocytes: 0 %
Lymphocytes Relative: 18 %
Lymphs Abs: 1.2 10*3/uL (ref 0.7–4.0)
MCH: 29 pg (ref 26.0–34.0)
MCHC: 29.8 g/dL — ABNORMAL LOW (ref 30.0–36.0)
MCV: 97.3 fL (ref 80.0–100.0)
Monocytes Absolute: 0.6 10*3/uL (ref 0.1–1.0)
Monocytes Relative: 10 %
Neutro Abs: 3.8 10*3/uL (ref 1.7–7.7)
Neutrophils Relative %: 60 %
Platelets: 224 10*3/uL (ref 150–400)
RBC: 2.93 MIL/uL — ABNORMAL LOW (ref 3.87–5.11)
RDW: 18.1 % — ABNORMAL HIGH (ref 11.5–15.5)
WBC: 6.3 10*3/uL (ref 4.0–10.5)
nRBC: 0 % (ref 0.0–0.2)

## 2019-05-07 LAB — BASIC METABOLIC PANEL
Anion gap: 9 (ref 5–15)
BUN: 32 mg/dL — ABNORMAL HIGH (ref 8–23)
CO2: 30 mmol/L (ref 22–32)
Calcium: 8.3 mg/dL — ABNORMAL LOW (ref 8.9–10.3)
Chloride: 100 mmol/L (ref 98–111)
Creatinine, Ser: 1.07 mg/dL — ABNORMAL HIGH (ref 0.44–1.00)
GFR calc Af Amer: >60 mL/min (ref >60)
GFR calc non Af Amer: 54 mL/min — ABNORMAL LOW (ref >60)
Glucose, Bld: 86 mg/dL (ref 70–99)
Potassium: 4.4 mmol/L (ref 3.5–5.1)
Sodium: 139 mmol/L (ref 135–145)

## 2019-05-07 MED ORDER — SODIUM CHLORIDE (PF) 0.9 % IJ SOLN
INTRAMUSCULAR | Status: AC
  Filled 2019-05-07: qty 50

## 2019-05-07 MED ORDER — IOHEXOL 350 MG/ML SOLN
100.0000 mL | Freq: Once | INTRAVENOUS | Status: AC | PRN
  Administered 2019-05-07: 100 mL via INTRAVENOUS

## 2019-05-07 MED ORDER — FENTANYL CITRATE (PF) 100 MCG/2ML IJ SOLN
50.0000 ug | Freq: Once | INTRAMUSCULAR | Status: AC | PRN
  Administered 2019-05-07: 50 ug via INTRAVENOUS
  Filled 2019-05-07: qty 2

## 2019-05-07 MED ORDER — METOCLOPRAMIDE HCL 5 MG/ML IJ SOLN
10.0000 mg | Freq: Once | INTRAMUSCULAR | Status: AC
  Administered 2019-05-07: 10 mg via INTRAVENOUS
  Filled 2019-05-07: qty 2

## 2019-05-07 MED ORDER — METHOCARBAMOL 500 MG PO TABS: 500.0000 mg | ORAL_TABLET | Freq: Two times a day (BID) | ORAL | 0 refills | Status: DC

## 2019-05-07 NOTE — ED Provider Notes (Signed)
[facility] DEPT Provider Note   CSN: [id number] Arrival date & time: 05/07/19  0816     History Chief Complaint  Patient presents with  . head and neck pain    Jillian Eaton is a 69 y.o. female.  HPI 69 year old female with extensive medical history notable for chronic A. fib, hypertension, seizures, fibromyalgia presents to the emergency room complaining of head and neck pain which started this morning at 1:30 AM.  Patient noted the pain woke her up from her sleep, and she tried to take some Dilaudid which did not help.  The pain is located in her neck and left side of her head and has remained fairly constant.  She states her head and neck are pain to the touch, describing the pain as spasms.  She also notes some nerve pain at the top of her head.  She denies a history of headaches, states she has never had this kind of pain before.  She denies neck stiffness, fevers, chills, vision changes nausea, vomiting.  She states that she had a right total knee replacement approximately a week ago.    Past Medical History:  Diagnosis Date  . Anemia    iron and pernicious  . Arthritis   . Asthma    flare up last yr  . CHF (congestive heart failure) ([place])   . Chronic headache    " better now "   . Complication of anesthesia    BP dropped   . DOE (dyspnea on exertion)    2D ECHO, 02/12/2012 - EF 60-65%, moderate concentric hypertrophy  . Dysrhythmia   . Fibromyalgia    nerve pain"left side at waist level" "can't lay on that side without pain" , "HOB elevation helps"; pt. thinks this has resolved after 200lb weight loss9-17- since i lost the wieght , i dont  think i have this anymore   . Heart murmur   . Hematuria - cause not known    resolved   . History of kidney stones    x 2 '13, '14 surgery to remove  . Hypertension   . Pneumonia    aspiration  04/2016  . SBO (small bowel obstruction) ([place]) 06/07/2013  . SBO (small bowel obstruction)  ([place])    rqueired admission 2020  . Thyroid disease    "goiter"  . Transfusion history    10 yrs+    Patient Active Problem List   Diagnosis Date Noted  . S/P right TKA 04/29/2019  . Medication management 03/11/2019  . Stiffness of left knee 12/12/2018  . Acute on chronic respiratory failure with hypoxia ([place]) 12/07/2018  . Hypothyroidism 12/07/2018  . Normocytic anemia 12/07/2018  . Asthma 12/07/2018  . S/P left TKA 11/14/2018  . Status post total left knee replacement 11/14/2018  . Degenerative joint disease involving multiple joints on both sides of body 07/31/2018  . Rotator cuff tear arthropathy of right shoulder 04/05/2018  . Overactive bladder 04/05/2018  . Steroid-induced hyperglycemia   . Generalized OA   . 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 NICM (nonischemic cardiomyopathy) ([place]) 01/15/2018  . Osteoporosis 12/13/2017  . Rotator cuff tear arthropathy 11/29/2017  . Chronic diastolic CHF (congestive heart failure) ([place]) 09/28/2017  . Acute renal failure superimposed on stage 3 chronic kidney disease ([place]) 09/07/2017  . Hypomagnesemia with secondary hypocalcemia 09/07/2017  . 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 Hypercapnemia 07/13/2017  . AKI (acute kidney injury) ([place]) 07/10/2017  . Vertigo 07/08/2017  .  Blurring of visual image 07/08/2017  . On anticoagulant therapy 06/19/2017  . Chronic respiratory failure with hypoxia ([place]) 04/19/2017  . GERD (gastroesophageal reflux disease) 08/28/2016  . 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 Depressive disorder 08/28/2016  . Iatrogenic hypocalcemia 08/28/2016  . Chronic pain syndrome   . Acute diastolic (congestive) heart failure ([place])   . Oropharyngeal dysphagia   . Chronic atrial fibrillation ([place]) 04/28/2016  . Vocal cord dysfunction 04/28/2016  . Thrombocytopenia ([place]) 04/28/2016  . Seizures ([place])   . Preoperative cardiovascular examination   . Unilateral vocal cord paralysis  11/05/2015  . Leg swelling 10/19/2015  . Acute on chronic respiratory failure with hypoxia and hypercapnia ([place]) 08/03/2015  . Bilateral leg edema 05/11/2015  . Essential hypertension 05/11/2015  . Nephrolithiasis 04/12/2015  . Numbness in both hands 04/12/2015  . Complete tear of left rotator cuff 11/19/2014  . Primary osteoarthritis of both knees 03/24/2014  . Chronic asthmatic bronchitis ([place]) 07/30/2013  . Bariatric surgery status 07/30/2013  . 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 Urge incontinence of urine 07/30/2013  . Vitamin D deficiency 07/30/2013  . B-complex deficiency 07/30/2013  . Cough 07/30/2013  . Gross hematuria 07/30/2013  . Goiter 07/30/2013  . Palpitations 07/30/2013  . Right flank pain 07/30/2013  . SBO (small bowel obstruction) ([place]) 06/07/2013  . Pulmonary HTN ([place]) 01/08/2013  . Insomnia 03/22/2012  . Mild intermittent asthma with acute exacerbation 03/22/2012  . Fibromyalgia 10/26/2011  . Anemia of chronic disease 03/28/2011  . Obesity (BMI 30-39.9) 09/22/2010    Past Surgical History:  Procedure Laterality Date  . ABDOMINAL ADHESION SURGERY  2004   open LOA - in [place]  . CARDIAC CATHETERIZATION  04/04/2010   No significant obstructive coronary artery disease  . CARDIOVERSION N/A 08/08/2017   Procedure: CARDIOVERSION;  Surgeon: Pixie Casino, MD;  Location: [facility];  Service: Cardiovascular;  Laterality: N/A;  . CHOLECYSTECTOMY  1990  . COLONOSCOPY W/ POLYPECTOMY    . COLONOSCOPY WITH PROPOFOL N/A 04/10/2014   Procedure: COLONOSCOPY WITH PROPOFOL;  Surgeon: Beryle Beams, MD;  Location: [facility];  Service: Endoscopy;  Laterality: N/A;  . EYE SURGERY     lasik 20-25 yrs ago  . GASTROPLASTY VERTICAL BANDED  1985  . [facility]   In [place] for SBO  . REVERSE SHOULDER ARTHROPLASTY Right 03/29/2018   Procedure: REVERSE SHOULDER ARTHROPLASTY;  Surgeon: Netta Cedars, MD;  Location: [facility];  Service:  Orthopedics;  Laterality: Right;  . ROUX-EN-Y GASTRIC BYPASS  1992   Conversion VBG to RnYGB in [place], [place]  . SHOULDER ARTHROSCOPY WITH SUBACROMIAL DECOMPRESSION  2016   Dr Berenice Primas, [place], [place]  . SMALL INTESTINE SURGERY  2000   SBO - LOA w SB resection  . TOTAL KNEE ARTHROPLASTY Left 11/14/2018   Procedure: TOTAL KNEE ARTHROPLASTY;  Surgeon: Paralee Cancel, MD;  Location: [facility];  Service: Orthopedics;  Laterality: Left;  70 mins  . TOTAL KNEE ARTHROPLASTY Right 04/29/2019   Procedure: TOTAL KNEE ARTHROPLASTY;  Surgeon: Paralee Cancel, MD;  Location: [facility];  Service: Orthopedics;  Laterality: Right;  70 mins  . TOTAL THYROIDECTOMY  06/26/2012   Dr Celine Ahr, [facility]  . TUBAL LIGATION  1986  . URETHRAL DILATION  2012   w cystoscopy.  Dr Gaynelle Arabian  . UTERINE FIBROID EMBOLIZATION       OB History   No obstetric history on file.     Family History  Problem Relation Age of Onset  .  Diabetes Mother   . Epilepsy Mother   . Cancer Mother        Breast  . Hypertension Mother   . Breast cancer Mother   . Seizures Mother   . Kidney disease Father   . Diabetes Father   . Hypertension Father   . Asthma Father   . Heart disease Father   . Epilepsy Sister   . Cancer Maternal Grandmother   . Breast cancer Maternal Grandmother   . Cancer Paternal Grandmother   . Breast cancer Paternal Grandmother     Social History   Tobacco Use  . Smoking status: Never Smoker  . Smokeless tobacco: Never Used  Substance Use Topics  . Alcohol use: No    Comment: wine occ  . Drug use: No    Types: Oxycodone    Comment: perscribed    Home Medications Prior to Admission medications   Medication Sig Start Date End Date Taking? Authorizing Provider  albuterol (VENTOLIN HFA) 108 (90 Base) MCG/ACT inhaler Inhale 2 puffs into the lungs every 6 (six) hours as needed for wheezing or shortness of breath. 03/11/19  Yes Lauraine Rinne, NP  acetaminophen (TYLENOL) 500 MG tablet Take 2 tablets  (1,000 mg total) by mouth every 8 (eight) hours. 04/30/19   Danae Orleans, PA-C  calcitRIOL (ROCALTROL) 0.5 MCG capsule Take 0.5 mcg by mouth daily.    [provider]  clotrimazole (LOTRIMIN) 1 % cream APPLY TO AFFECTED AREA TWICE A DAY Patient taking differently: Apply 1 application topically daily as needed (itching).  02/07/19   Marzetta Board, DPM  cyanocobalamin (,VITAMIN B-12,) 1000 MCG/ML injection Inject 1 mL (1,000 mcg total) into the muscle every 30 (thirty) days. 04/05/18   Hennie Duos, MD  cyclobenzaprine (FLEXERIL) 10 MG tablet Take 2 tablets (20 mg total) by mouth 2 (two) times daily. Patient taking differently: Take 10 mg by mouth 3 (three) times daily as needed for muscle spasms.  04/05/18   Hennie Duos, MD  docusate sodium (COLACE) 100 MG capsule Take 1 capsule (100 mg total) by mouth 2 (two) times daily. 04/30/19   Danae Orleans, PA-C  DULoxetine (CYMBALTA) 60 MG capsule Take 1 capsule (60 mg total) by mouth daily. 04/05/18   Hennie Duos, MD  ELIQUIS 5 MG TABS tablet TAKE 1 TABLET TWICE A DAY Patient taking differently: Take 5 mg by mouth 2 (two) times daily.  02/05/19   Hilty, Nadean Corwin, MD  eszopiclone (LUNESTA) 2 MG TABS tablet Take 1 tablet (2 mg total) by mouth at bedtime. 04/05/18   Hennie Duos, MD  ferrous sulfate (FERROUSUL) 325 (65 FE) MG tablet Take 1 tablet (325 mg total) by mouth 3 (three) times daily with meals for 14 days. 04/30/19 05/14/19  Danae Orleans, PA-C  fluticasone furoate-vilanterol (BREO ELLIPTA) 100-25 MCG/INH AEPB Inhale 1 puff into the lungs daily. 03/14/19   Lauraine Rinne, NP  furosemide (LASIX) 20 MG tablet Take 1 tablet (20 mg total) by mouth as needed. Patient taking differently: Take 20 mg by mouth daily as needed for fluid.  01/24/19   Hilty, Nadean Corwin, MD  HYDROmorphone (DILAUDID) 2 MG tablet Take 1-2 tablets (2-4 mg total) by mouth every 4 (four) hours as needed for severe pain. 04/30/19   Danae Orleans, PA-C    levETIRAcetam (KEPPRA) 500 MG tablet Take 1 tablet (500 mg total) by mouth 2 (two) times daily. 10/02/18   Suzzanne Cloud, NP  levothyroxine (SYNTHROID) 150 MCG tablet  TAKE 1 TABLET BY MOUTH EVERY DAY Patient taking differently: Take 150 mcg by mouth daily before breakfast.  04/07/19   Philemon Kingdom, MD  magnesium oxide (MAG-OX) 400 MG tablet Take 1 tablet (400 mg total) by mouth daily. 04/05/18   Hennie Duos, MD  metoprolol succinate (TOPROL-XL) 25 MG 24 hr tablet Take 25 mg by mouth 2 (two) times daily.    [provider]  mirabegron ER (MYRBETRIQ) 50 MG TB24 tablet Take 1 tablet (50 mg total) by mouth daily. Patient taking differently: Take 50 mg by mouth at bedtime as needed (incontinence).  04/05/18   Hennie Duos, MD  NONFORMULARY OR COMPOUNDED ITEM [facility]:  Antifungal Cream - Terbinafine 3%, Fluconazole 2%, Tea Tree Oil 5%, Urea 10%, Ibuprofen 2% in DMSO Suspension #26ml. Apply to affected toenail(s) once daily (at bedtime) or twice daily. Patient taking differently: Apply 1 application topically at bedtime. [facility]:  Antifungal Cream - Terbinafine 3%, Fluconazole 2%, Tea Tree Oil 5%, Urea 10%, Ibuprofen 2% in DMSO Suspension #67ml. Apply to affected toenail(s) once daily (at bedtime) or twice daily. 05/07/18   Marzetta Board, DPM  polyethylene glycol (MIRALAX / GLYCOLAX) 17 g packet Take 17 g by mouth 2 (two) times daily. 04/30/19   Danae Orleans, PA-C  traZODone (DESYREL) 50 MG tablet Take 1 tablet (50 mg total) by mouth at bedtime. 04/05/18   Hennie Duos, MD  Vitamin D, Ergocalciferol, (DRISDOL) 1.25 MG (50000 UT) CAPS capsule Take 1 capsule (50,000 Units total) by mouth every Wednesday. 04/10/18   Hennie Duos, MD    Allergies    Hydrocodone  Review of Systems   Review of Systems  Constitutional: Negative for appetite change, chills, fatigue and fever.  HENT: Negative for ear pain, facial swelling, hearing loss, sinus pain  and sore throat.   Eyes: Negative for photophobia, pain and visual disturbance.  Respiratory: Negative.   Cardiovascular: Negative for chest pain.  Gastrointestinal: Negative.   Musculoskeletal: Negative.   Neurological: Positive for seizures and headaches. Negative for dizziness, syncope, facial asymmetry, speech difficulty, weakness, light-headedness and numbness.  Psychiatric/Behavioral: Negative for confusion.    Physical Exam Updated Vital Signs BP (!) 103/92   Pulse 82   Temp 98 F (36.7 C) (Oral)   Resp 16   SpO2 99%   Physical Exam Vitals reviewed.  Constitutional:      Comments: Appears in pain, holding left side of head  HENT:     Head:     Comments: No visible deformities, no signs of trauma, tenderness to palpation in the occipital nerve distribution.  No tenderness to mastoid process, no left ear protrusion, no TTP to tragus.     Right Ear: Tympanic membrane normal.     Left Ear: Tympanic membrane, ear canal and external ear normal.     Nose: Nose normal.     Mouth/Throat:     Mouth: Mucous membranes are dry.  Eyes:     Comments: Pupils reactive, no nystagmus, pupils mildly uneven  Cardiovascular:     Rate and Rhythm: Normal rate and regular rhythm.  Pulmonary:     Effort: Pulmonary effort is normal.     Breath sounds: Normal breath sounds.  Musculoskeletal:        General: Tenderness present. No swelling, deformity or signs of injury. Normal range of motion.     Cervical back: Normal range of motion. Tenderness present. No rigidity.  Skin:    General: Skin is warm and  dry.  Neurological:     General: No focal deficit present.     Mental Status: She is alert and oriented to person, place, and time.  Psychiatric:        Mood and Affect: Mood normal.        Behavior: Behavior normal.     ED Results / Procedures / Treatments   Labs (all labs ordered are listed, but only abnormal results are displayed) Labs Reviewed  CBC WITH DIFFERENTIAL/PLATELET -  Abnormal; Notable for the following components:      Result Value   RBC 2.93 (*)    Hemoglobin 8.5 (*)    HCT 28.5 (*)    MCHC 29.8 (*)    RDW 18.1 (*)    Eosinophils Absolute 0.7 (*)    All other components within normal limits  BASIC METABOLIC PANEL - Abnormal; Notable for the following components:   BUN 32 (*)    Creatinine, Ser 1.07 (*)    Calcium 8.3 (*)    GFR calc non Af Amer 54 (*)    All other components within normal limits    EKG None  Radiology CT Head Wo Contrast  Result Date: 05/07/2019 CLINICAL DATA:  Headache. EXAM: CT HEAD WITHOUT CONTRAST TECHNIQUE: Contiguous axial images were obtained from the base of the skull through the vertex without intravenous contrast. COMPARISON:  05/26/2018 FINDINGS: Brain: No evidence of acute infarction, hemorrhage, hydrocephalus, extra-axial collection or mass lesion/mass effect. Vascular: No hyperdense vessel or unexpected calcification. Skull: Normal. Negative for fracture or focal lesion. Sinuses/Orbits: Globes and orbits are unremarkable. Visualized sinuses and mastoid air cells are clear. Other: None. IMPRESSION: Normal unenhanced CT scan of the brain. Electronically Signed   By: Lajean Manes M.D.   On: 05/07/2019 10:50    Procedures Procedures (including critical care time)  Medications Ordered in ED Medications  sodium chloride (PF) 0.9 % injection (has no administration in time range)  metoCLOPramide (REGLAN) injection 10 mg (10 mg Intravenous Given 05/07/19 1246)  fentaNYL (SUBLIMAZE) injection 50 mcg (50 mcg Intravenous Given 05/07/19 1249)    ED Course  I have reviewed the triage vital signs and the nursing notes.  Pertinent labs & imaging results that were available during my care of the patient were reviewed by me and considered in my medical decision making (see chart for details).    MDM Rules/Calculators/A&P                     69 year old female with extensive history presents to the ED due to severe  left-sided head and neck pain. -Vitals unremarkable.  Patient afebrile -Patient with known stage III CKD.  Her creatinine appears improved from her last visit. -CT of head negative.  Dr. Kathrynn Humble evaluated the patient as well, will rule out dissection.  Pending CT angio of head and neck -Fentanyl and Reglan for pain -DDx includes epidural/subdural bleed, meningitis, dissection, occipital neuralgia, trigeminal neuralgia, neck strain.  Patient is afebrile, no focal neural deficits, denies sudden onset of thunderclap headache, no known trauma.  I am leaning towards a possible occipital neuralgia diagnosis given nerve distribution pending negative CT angio -Signout patient to Etta Quill.  I expect if CT angio is clear patient will be cleared for discharge.  Final Clinical Impression(s) / ED Diagnoses Final diagnoses:  None    Rx / DC Orders ED Discharge Orders    None       Garald Balding, PA-C 05/07/19 [place],  Ankit, MD 05/12/19 [id number]

## 2019-05-07 NOTE — ED Notes (Signed)
Patient ambulated independently with cane to restroom

## 2019-05-07 NOTE — ED Provider Notes (Signed)
Patient received in sign out from [place], [place]. Patient presented today with sudden onset of left sided headache and neck pain. Patient is awaiting completion of CT angio of head and neck to evaluate for dissection.  Results for orders placed or performed during the hospital encounter of 05/07/19  CBC with Differential  Result Value Ref Range   WBC 6.3 4.0 - 10.5 K/uL   RBC 2.93 (L) 3.87 - 5.11 MIL/uL   Hemoglobin 8.5 (L) 12.0 - 15.0 g/dL   HCT 28.5 (L) 36.0 - 46.0 %   MCV 97.3 80.0 - 100.0 fL   MCH 29.0 26.0 - 34.0 pg   MCHC 29.8 (L) 30.0 - 36.0 g/dL   RDW 18.1 (H) 11.5 - 15.5 %   Platelets 224 150 - 400 K/uL   nRBC 0.0 0.0 - 0.2 %   Neutrophils Relative % 60 %   Neutro Abs 3.8 1.7 - 7.7 K/uL   Lymphocytes Relative 18 %   Lymphs Abs 1.2 0.7 - 4.0 K/uL   Monocytes Relative 10 %   Monocytes Absolute 0.6 0.1 - 1.0 K/uL   Eosinophils Relative 11 %   Eosinophils Absolute 0.7 (H) 0.0 - 0.5 K/uL   Basophils Relative 1 %   Basophils Absolute 0.1 0.0 - 0.1 K/uL   Immature Granulocytes 0 %   Abs Immature Granulocytes 0.02 0.00 - 0.07 K/uL  Basic metabolic panel  Result Value Ref Range   Sodium 139 135 - 145 mmol/L   Potassium 4.4 3.5 - 5.1 mmol/L   Chloride 100 98 - 111 mmol/L   CO2 30 22 - 32 mmol/L   Glucose, Bld 86 70 - 99 mg/dL   BUN 32 (H) 8 - 23 mg/dL   Creatinine, Ser 1.07 (H) 0.44 - 1.00 mg/dL   Calcium 8.3 (L) 8.9 - 10.3 mg/dL   GFR calc non Af Amer 54 (L) >60 mL/min   GFR calc Af Amer >60 >60 mL/min   Anion gap 9 5 - 15   CT Angio Head W or Wo Contrast  Result Date: 05/07/2019 CLINICAL DATA:  Severe head and neck pain EXAM: CT ANGIOGRAPHY HEAD AND NECK TECHNIQUE: Multidetector CT imaging of the head and neck was performed using the standard protocol during bolus administration of intravenous contrast. Multiplanar CT image reconstructions and MIPs were obtained to evaluate the vascular anatomy. Carotid stenosis measurements (when applicable) are obtained utilizing NASCET  criteria, using the distal internal carotid diameter as the denominator. CONTRAST:  112mL OMNIPAQUE IOHEXOL 350 MG/ML SOLN COMPARISON:  None. FINDINGS: CTA NECK FINDINGS Aortic arch: Great vessel origins are patent. There is direct origin of the left vertebral artery from the arch. Right carotid system: Patent. No measurable stenosis or evidence of dissection. Left carotid system: Patent. No measurable stenosis or evidence of dissection. Vertebral arteries: Patent. Right vertebral artery is dominant. No measurable stenosis or evidence of dissection. Skeleton: Mild chronic upper thoracic spine compression deformities. Mild cervical spine degenerative changes. Other neck: No mass or adenopathy. Upper chest: No apical lung mass. Review of the MIP images confirms the above findings CTA HEAD FINDINGS Anterior circulation: Intracranial internal carotid arteries are patent. Anterior and middle cerebral arteries are patent. Posterior circulation: Intracranial vertebral arteries, basilar artery, and posterior cerebral arteries are patent. Bilateral posterior communicating arteries are identified. Venous sinuses: As permitted by contrast timing, patent. Review of the MIP images confirms the above findings IMPRESSION: No large vessel occlusion, hemodynamically significant stenosis, or evidence of dissection. Electronically Signed   By:  Macy Mis M.D.   On: 05/07/2019 16:45   CT Head Wo Contrast  Result Date: 05/07/2019 CLINICAL DATA:  Headache. EXAM: CT HEAD WITHOUT CONTRAST TECHNIQUE: Contiguous axial images were obtained from the base of the skull through the vertex without intravenous contrast. COMPARISON:  05/26/2018 FINDINGS: Brain: No evidence of acute infarction, hemorrhage, hydrocephalus, extra-axial collection or mass lesion/mass effect. Vascular: No hyperdense vessel or unexpected calcification. Skull: Normal. Negative for fracture or focal lesion. Sinuses/Orbits: Globes and orbits are unremarkable.  Visualized sinuses and mastoid air cells are clear. Other: None. IMPRESSION: Normal unenhanced CT scan of the brain. Electronically Signed   By: Lajean Manes M.D.   On: 05/07/2019 10:50   CT Angio Neck W and/or Wo Contrast  Result Date: 05/07/2019 CLINICAL DATA:  Severe head and neck pain EXAM: CT ANGIOGRAPHY HEAD AND NECK TECHNIQUE: Multidetector CT imaging of the head and neck was performed using the standard protocol during bolus administration of intravenous contrast. Multiplanar CT image reconstructions and MIPs were obtained to evaluate the vascular anatomy. Carotid stenosis measurements (when applicable) are obtained utilizing NASCET criteria, using the distal internal carotid diameter as the denominator. CONTRAST:  13mL OMNIPAQUE IOHEXOL 350 MG/ML SOLN COMPARISON:  None. FINDINGS: CTA NECK FINDINGS Aortic arch: Great vessel origins are patent. There is direct origin of the left vertebral artery from the arch. Right carotid system: Patent. No measurable stenosis or evidence of dissection. Left carotid system: Patent. No measurable stenosis or evidence of dissection. Vertebral arteries: Patent. Right vertebral artery is dominant. No measurable stenosis or evidence of dissection. Skeleton: Mild chronic upper thoracic spine compression deformities. Mild cervical spine degenerative changes. Other neck: No mass or adenopathy. Upper chest: No apical lung mass. Review of the MIP images confirms the above findings CTA HEAD FINDINGS Anterior circulation: Intracranial internal carotid arteries are patent. Anterior and middle cerebral arteries are patent. Posterior circulation: Intracranial vertebral arteries, basilar artery, and posterior cerebral arteries are patent. Bilateral posterior communicating arteries are identified. Venous sinuses: As permitted by contrast timing, patent. Review of the MIP images confirms the above findings IMPRESSION: No large vessel occlusion, hemodynamically significant stenosis, or  evidence of dissection. Electronically Signed   By: Macy Mis M.D.   On: 05/07/2019 16:45    No dissection noted on CT angio. 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 Patient discharged with NSAID and follow-up with PCP.   Etta Quill, NP 05/08/19 [place]    Blanchie Dessert, MD 05/08/19 [phone]

## 2019-05-07 NOTE — Discharge Instructions (Signed)
Take ibuprofen, 200-400 mg, every 8 hours. Take robaxin as directed. Follow-up with your primary care provider. Refer to the attached instructions

## 2019-05-07 NOTE — ED Triage Notes (Signed)
Per pt, states she woke up with head and neck pain this am-no injury or trauma, no fever-recently had a right knee replacment

## 2019-05-09 ENCOUNTER — Telehealth: Payer: Self-pay | Admitting: Adult Health

## 2019-05-09 DIAGNOSIS — M25561 Pain in right knee: Secondary | ICD-10-CM | POA: Diagnosis not present

## 2019-05-09 NOTE — Telephone Encounter (Signed)
Received a new hem referral from Dr. Maia Petties for IDA. Jillian Eaton has been cld and scheduled to see Mendel Ryder on 3/25 at 9am w/labs at 42am. Jillian Eaton is aware to arrive 15 minutes ealry.

## 2019-05-13 ENCOUNTER — Other Ambulatory Visit: Payer: Self-pay

## 2019-05-13 ENCOUNTER — Encounter: Payer: Self-pay | Admitting: Pulmonary Disease

## 2019-05-13 ENCOUNTER — Ambulatory Visit (INDEPENDENT_AMBULATORY_CARE_PROVIDER_SITE_OTHER): Payer: Medicare Other | Admitting: Pulmonary Disease

## 2019-05-13 VITALS — BP 122/68 | HR 94 | Temp 97.1°F | Ht 67.0 in | Wt 218.0 lb

## 2019-05-13 DIAGNOSIS — J4540 Moderate persistent asthma, uncomplicated: Secondary | ICD-10-CM

## 2019-05-13 DIAGNOSIS — G4734 Idiopathic sleep related nonobstructive alveolar hypoventilation: Secondary | ICD-10-CM

## 2019-05-13 NOTE — Progress Notes (Signed)
[facility]  Chief Complaint  Patient presents with  . Follow-up    Breathing is overall doing well. She rarely uses her albuterol inhaler.     Constitutional:  BP 122/68 (BP Location: Left Arm, Cuff Size: Normal)   Pulse 94   Temp (!) 97.1 F (36.2 C) (Temporal)   Ht 5\' 7"  (1.702 m)   Wt 218 lb (98.9 kg)   SpO2 97% Comment: on RA  BMI 34.14 kg/m   Past Medical History:  OSA, Respiratory failure, Anemia, OA, Asthma, CKD, Depression, Fibromyalgia, GERD, HTN, Hypothyroidism, Nephrolithiasis, Migraine HA, Osteopenia, Unilateral vocal cord paralysis 2017, PAF, Combined CHF, Seizure 2018, CKD 3  Brief Summary:  Jillian Eaton is a 69 y.o. female with asthma.  She has been using Breo.  Arnuity was more expensive.  Breathing better.  Doesn't need albuterol.  Not having cough, wheeze, or sputum.  Had ONO.  Low oxygen.  She doesn't recall get message about needing oxygen at night.  She doesn't feel like she has an issue with her breathing at night, and sleeping okay as long as she uses a sleep medicine.  Had knee surgery couple weeks ago.  Going to outpt PT.  Physical Exam:   Appearance - well kempt   ENMT - no sinus tenderness, no nasal discharge, no oral exudate  Respiratory - normal appearance of chest wall, normal respiratory effort w/o accessory muscle use, no dullness on percussion, no wheezing or rales  CV - s1s2 regular rate and rhythm, no murmurs, no peripheral edema, radial pulses symmetric   Lymph - no adenopathy noted in neck and axillary areas  Ext - no cyanosis, clubbing, or joint inflammation noted  Psych - normal mood and affect    Assessment/Plan:   Moderate, persistent asthma. - continue breo - prn albuterol  History of sleep disordered breathing. - will repeat ONO with room air and then determine if she needs additional testing  Atrial fibrillation, chronic combined CHF, HTN. - f/u with cardiology   Patient Instructions  Will arrange for overnight oxygen test  Follow up in 4 months  Time spent 32 minutes  Chesley Mires, MD [place] Pager: [phone] 05/13/2019, 10:34 AM  Flow Sheet     Pulmonary tests:  PFT 10/19/15 >> FEV1 1.38 (60%), FEV1% 82, TLC 3.34 (60%), DLCO 74%, +BD ABG 07/08/16 >> pH 7.38, PCO2 51.9, PO2 70 PFT 03/11/19 >> FEV1 1.44 (65%), FEV1% 83, TLC 3.97 (72%), DLCO 72%, +BD  Chest imaging:  CT angio chest 12/06/18 >> enlarged PA, CM, mosaic attenuation with GGO RUL  Sleep tests:    Cardiac tests:  Echo 12/08/18 >> EF 55 to 60%, severe LA/RA dilation   Medications:   Allergies as of 05/13/2019      Reactions   Hydrocodone Itching         Medication List       Accurate as of May 13, 2019 10:34 AM. If you have any questions, ask your nurse or doctor.        STOP taking these medications   docusate sodium 100 MG capsule Commonly known as: Colace Stopped by: Chesley Mires, MD   magnesium oxide 400 MG tablet Commonly known as: MAG-OX Stopped by: Chesley Mires, MD   methocarbamol 500 MG tablet Commonly known as: ROBAXIN Stopped by: Chesley Mires, MD   mirabegron ER 50 MG Tb24 tablet Commonly known as: Myrbetriq Stopped by: Chesley Mires, MD     TAKE these medications  acetaminophen 500 MG tablet Commonly known as: TYLENOL Take 2 tablets (1,000 mg total) by mouth every 8 (eight) hours. What changed:   when to take this  reasons to take this   albuterol 108 (90 Base) MCG/ACT inhaler Commonly known as: VENTOLIN HFA Inhale 2 puffs into the lungs every 6 (six) hours as needed for wheezing or shortness of breath.   Breo Ellipta 100-25 MCG/INH Aepb Generic drug: fluticasone furoate-vilanterol Inhale 1 puff into the lungs daily.   calcitRIOL 0.5 MCG capsule Commonly known as: ROCALTROL Take 0.5 mcg by mouth in the morning and at bedtime.   clotrimazole 1 % cream Commonly known as: LOTRIMIN APPLY TO AFFECTED  AREA TWICE A DAY What changed: See the new instructions.   cyanocobalamin 1000 MCG/ML injection Commonly known as: (VITAMIN B-12) Inject 1 mL (1,000 mcg total) into the muscle every 30 (thirty) days.   cyclobenzaprine 10 MG tablet Commonly known as: FLEXERIL Take 2 tablets (20 mg total) by mouth 2 (two) times daily. What changed:   how much to take  when to take this  reasons to take this   DULoxetine 60 MG capsule Commonly known as: CYMBALTA Take 1 capsule (60 mg total) by mouth daily.   Eliquis 5 MG Tabs tablet Generic drug: apixaban TAKE 1 TABLET TWICE A DAY What changed: how much to take   eszopiclone 2 MG Tabs tablet Commonly known as: LUNESTA Take 1 tablet (2 mg total) by mouth at bedtime.   ferrous sulfate 325 (65 FE) MG tablet Commonly known as: FerrouSul Take 1 tablet (325 mg total) by mouth 3 (three) times daily with meals for 14 days.   furosemide 20 MG tablet Commonly known as: LASIX Take 1 tablet (20 mg total) by mouth as needed. What changed: when to take this   HYDROmorphone 2 MG tablet Commonly known as: Dilaudid Take 1-2 tablets (2-4 mg total) by mouth every 4 (four) hours as needed for severe pain.   levETIRAcetam 500 MG tablet Commonly known as: Keppra Take 1 tablet (500 mg total) by mouth 2 (two) times daily.   levothyroxine 150 MCG tablet Commonly known as: SYNTHROID TAKE 1 TABLET BY MOUTH EVERY DAY What changed: when to take this   magnesium oxide 400 (241.3 Mg) MG tablet Commonly known as: MAG-OX Take 1 tablet by mouth daily.   metoprolol succinate 25 MG 24 hr tablet Commonly known as: TOPROL-XL Take 25 mg by mouth 2 (two) times daily.   NONFORMULARY OR COMPOUNDED ITEM [facility]:  Antifungal Cream - Terbinafine 3%, Fluconazole 2%, Tea Tree Oil 5%, Urea 10%, Ibuprofen 2% in DMSO Suspension #65ml. Apply to affected toenail(s) once daily (at bedtime) or twice daily. What changed:   how much to take  how to take this   when to take this   polyethylene glycol 17 g packet Commonly known as: MIRALAX / GLYCOLAX Take 17 g by mouth 2 (two) times daily. What changed:   when to take this  reasons to take this   traZODone 50 MG tablet Commonly known as: DESYREL Take 1 tablet (50 mg total) by mouth at bedtime.   Vitamin D (Ergocalciferol) 1.25 MG (50000 UNIT) Caps capsule Commonly known as: DRISDOL Take 1 capsule (50,000 Units total) by mouth every Wednesday.       Past Surgical History:  She  has a past surgical history that includes Cholecystectomy (1990); Tubal ligation (1986); Cardiac catheterization (04/04/2010); Colonoscopy w/ polypectomy; Colonoscopy with propofol (N/A, 04/10/2014); Cardioversion (N/A, 08/08/2017); Eye surgery;  Reverse shoulder arthroplasty (Right, 03/29/2018); Roux-en-Y Gastric Bypass (1992); Gastroplasty vertical banded (1985); Laparoscopic lysis intestinal adhesions (1995); Total thyroidectomy (06/26/2012); Urethral dilation (2012); Shoulder arthroscopy with subacromial decompression (2016); Small intestine surgery (2000); Abdominal adhesion surgery (2004); Total knee arthroplasty (Left, 11/14/2018); Uterine fibroid embolization; and Total knee arthroplasty (Right, 04/29/2019).  Family History:  Her family history includes Asthma in her father; Breast cancer in her maternal grandmother, mother, and paternal grandmother; Cancer in her maternal grandmother, mother, and paternal grandmother; Diabetes in her father and mother; Epilepsy in her mother and sister; Heart disease in her father; Hypertension in her father and mother; Kidney disease in her father; Seizures in her mother.  Social History:  She  reports that she has never smoked. She has never used smokeless tobacco. She reports that she does not drink alcohol or use drugs.

## 2019-05-13 NOTE — Patient Instructions (Signed)
Will arrange for overnight oxygen test  Follow up in 4 months 

## 2019-05-14 ENCOUNTER — Other Ambulatory Visit: Payer: Self-pay

## 2019-05-14 DIAGNOSIS — D508 Other iron deficiency anemias: Secondary | ICD-10-CM

## 2019-05-14 DIAGNOSIS — D513 Other dietary vitamin B12 deficiency anemia: Secondary | ICD-10-CM

## 2019-05-14 DIAGNOSIS — D638 Anemia in other chronic diseases classified elsewhere: Secondary | ICD-10-CM

## 2019-05-14 DIAGNOSIS — M25561 Pain in right knee: Secondary | ICD-10-CM | POA: Diagnosis not present

## 2019-05-14 NOTE — Progress Notes (Addendum)
[name]  [phone] [phone]) [phone]     ID: Jillian Eaton DOB: 14-Jan-1951  MR#: [id number]  NAT#:[id number]  Patient Care Team: [name], MD as PCP - General (Internal Medicine) [name] Nadean Corwin, MD as PCP - Cardiology (Cardiology) Carol Ada, MD as Consulting Physician (Gastroenterology) Chesley Mires, MD as Consulting Physician (Pulmonary Disease) Philemon Kingdom, MD as Consulting Physician (Internal Medicine) Justin Mend, MD as Attending Physician (Internal Medicine) Marzetta Board, DPM as Consulting Physician (Podiatry) Servando Salina, MD as Consulting Physician (Obstetrics and Gynecology) Paralee Cancel, MD as Consulting Physician (Orthopedic Surgery) Scot Dock, NP OTHER MD:  CHIEF COMPLAINT: anemia  CURRENT TREATMENT: IV iron, b12 injections, oral folate   HISTORY OF CURRENT ILLNESS:  Jillian Eaton has a longstanding history of anemia and iron deficiency.  She has been followed by hematology several years ago.  She has had two separate bariatric surgeries which have interfered with her absorption of nutrients, vitamins, and minerals.  This has helped contribute to her anemia.  Jillian Eaton is a patient of Dr. Maia Petties who has been ordering her iron infusions and b12 injections.    Her B12 level is as follows:  Results for Corso, TREAZURE NERY" (MRN [id number]) as of 05/15/2019 16:32  Ref. Range 07/21/2011 10:13 03/27/2012 11:17 07/15/2017 02:39 12/07/2018 15:15 05/15/2019 08:45  Vitamin B12 Latest Ref Range: 180 - 914 pg/mL 579 509 233 1,394 (H) 812       Her past iron studies and folate are is as follows: Results for Crepeau, ETTA GASSETT" (MRN [id number]) as of 05/15/2019 16:32  Ref. Range 07/15/2017 02:39 12/07/2018 15:15 12/07/2018 15:16 05/15/2019 08:45 05/15/2019 11:13  Iron Latest Ref Range: 41 - 142 ug/dL 28 31  33 (L)   UIBC Latest Ref Range: 120 - 384 ug/dL 405 298  280    TIBC Latest Ref Range: 236 - 444 ug/dL 433 329  313   Saturation Ratios Latest Ref Range: 21 - 57 % 6 (L) 9 (L)  10 (L)   Ferritin Latest Ref Range: 11 - 307 ng/mL 15 65  117   Folate Latest Ref Range: >5.9 ng/mL   11.9  49.2   Jillian Eaton has had a worsening anemia, even despite receiving IV iron on 02/27/2019, 03/19/2019, and 04/07/2019.  She notes that previously she didn't require so many iron infusions.  She wants to know what else could be causing her anemia, and what could also help her feel better.    Her hemoglobin is as follows:  (note she started Keppra in 05/2018; had total knee replacement in 10/2018 and 04/2019) Results for Heindel, KYREN VAUX "RHONDA" (MRN [id number]) as of 05/15/2019 16:32  Ref. Range 07/15/2017 02:39 09/18/2017 15:01 01/03/2018 14:06 03/21/2018 13:46 03/30/2018 04:01 05/25/2018 19:29 08/01/2018 05:58 08/02/2018 06:40 08/04/2018 04:46 11/20/2018 15:48 11/27/2018 12:35 01/03/2019 18:06 02/11/2019 09:34 04/24/2019 10:51 04/30/2019 03:31 05/07/2019 12:48 05/15/2019 08:45  Hemoglobin Latest Ref Range: 12.0 - 15.0 g/dL 9.3 (L) 10.5 (L) 12.8 11.0 (L) 9.8 (L) 12.0 12.3 11.2 (L) 9.2 (L) 7.5 (L) 9.2 (L) 10.9 (L) 8.5 (L) 10.4 (L) 7.6 (L) 8.5 (L) 8.9 (L)    The patient's subsequent history is as detailed below.  INTERVAL HISTORY: Jillian Eaton is not feeling well.  She is fatigued, and has shortness of breath, worse with exertion, and also has a rattle in her chest.  She is concerned that her anemia is worsening because she is becoming more symptomatic.    Jillian Eaton has a history of A  fib and takes Eliquis daily.  She notes that she tolerates this well and has no issues with bleeding.  She also takes Keppra and was started on this in April of 2020 after hospitalization following a seizure.  She also had a knee replacement in September 2020 and March 2021 requiring a blood transfusion.  REVIEW OF SYSTEMS:  Jillian Eaton has a history of obesity.  She was previously 400 pounds and then had her bariatric  surgeries.  She lost down to 200 pounds, and then subsequently regained her weight.  She notes that she has gotten back down to 217 with diet and exercise.  She is very proud and happy about this.    Jillian Eaton denies any fever, chills, headaches, vision issues, blood in her stool/black tarry stool, easy bruising/bleeding, lymphadenopathy, night sweats, unintentional weight loss, nausea, vomiting, vaginal bleeding, chest pain, palpitations, or cough.  A detailed ROS Was otherwise non contributory.   PAST MEDICAL HISTORY: Past Medical History:  Diagnosis Date  . Anemia    iron and pernicious  . Arthritis   . Asthma    flare up last yr  . CHF (congestive heart failure) ([place])   . Chronic headache    " better now "   . Complication of anesthesia    BP dropped   . DOE (dyspnea on exertion)    2D ECHO, 02/12/2012 - EF 60-65%, moderate concentric hypertrophy  . Dysrhythmia   . Fibromyalgia    nerve pain"left side at waist level" "can't lay on that side without pain" , "HOB elevation helps"; pt. thinks this has resolved after 200lb weight loss9-17- since i lost the wieght , i dont  think i have this anymore   . Heart murmur   . Hematuria - cause not known    resolved   . History of kidney stones    x 2 '13, '14 surgery to remove  . Hypertension   . Pneumonia    aspiration  04/2016  . SBO (small bowel obstruction) ([place]) 06/07/2013  . SBO (small bowel obstruction) ([place])    rqueired admission 2020  . Thyroid disease    "goiter"  . Transfusion history    10 yrs+    PAST SURGICAL HISTORY: Past Surgical History:  Procedure Laterality Date  . ABDOMINAL ADHESION SURGERY  2004   open LOA - in [place]  . CARDIAC CATHETERIZATION  04/04/2010   No significant obstructive coronary artery disease  . CARDIOVERSION N/A 08/08/2017   Procedure: CARDIOVERSION;  Surgeon: Pixie Casino, MD;  Location: [facility];  Service: Cardiovascular;  Laterality: N/A;  . CHOLECYSTECTOMY  1990  . COLONOSCOPY W/  POLYPECTOMY    . COLONOSCOPY WITH PROPOFOL N/A 04/10/2014   Procedure: COLONOSCOPY WITH PROPOFOL;  Surgeon: Beryle Beams, MD;  [facility];  Service: Endoscopy;  Laterality: N/A;  . EYE SURGERY     lasik 20-25 yrs ago  . GASTROPLASTY VERTICAL BANDED  1985  . [place]   In [place] for SBO  . REVERSE SHOULDER ARTHROPLASTY Right 03/29/2018   Procedure: REVERSE SHOULDER ARTHROPLASTY;  Surgeon: Netta Cedars, MD;  Location: [facility];  Service: Orthopedics;  Laterality: Right;  . ROUX-EN-Y GASTRIC BYPASS  1992   Conversion VBG to RnYGB in [place]  . SHOULDER ARTHROSCOPY WITH SUBACROMIAL DECOMPRESSION  2016   Dr Berenice Primas, [place], [place]  . SMALL INTESTINE SURGERY  2000   SBO - LOA w SB resection  . TOTAL KNEE ARTHROPLASTY Left 11/14/2018  Procedure: TOTAL KNEE ARTHROPLASTY;  Surgeon: Paralee Cancel, MD;  [facility];  Service: Orthopedics;  Laterality: Left;  70 mins  . TOTAL KNEE ARTHROPLASTY Right 04/29/2019   Procedure: TOTAL KNEE ARTHROPLASTY;  Surgeon: Paralee Cancel, MD;  [facility];  Service: Orthopedics;  Laterality: Right;  70 mins  . TOTAL THYROIDECTOMY  06/26/2012   Dr Celine Ahr, [facility]  . TUBAL LIGATION  1986  . URETHRAL DILATION  2012   w cystoscopy.  Dr Gaynelle Arabian  . UTERINE FIBROID EMBOLIZATION      FAMILY HISTORY Family History  Problem Relation Age of Onset  . Diabetes Mother   . Epilepsy Mother   . Cancer Mother        Breast  . Hypertension Mother   . Breast cancer Mother   . Seizures Mother   . Kidney disease Father   . Diabetes Father   . Hypertension Father   . Asthma Father   . Heart disease Father   . Epilepsy Sister   . Cancer Maternal Grandmother   . Breast cancer Maternal Grandmother   . Cancer Paternal Grandmother   . Breast cancer Paternal Grandmother     GYNECOLOGIC HISTORY:  No LMP recorded. Patient is postmenopausal. Menarche: 69 years old Age at first live birth: 69 years  old [place] P 3 LMP uterine embolization at age 4 Contraceptive used for 14 years in the past HRT no  Hysterectomy? no Salpingo-oophorectomy?no    SOCIAL HISTORY: Jillian Eaton is retired and lives in [place] with her husband and dogs.  She does not have any current hobbies.     ADVANCED DIRECTIVES:    HEALTH MAINTENANCE: Social History   Tobacco Use  . Smoking status: Never Smoker  . Smokeless tobacco: Never Used  Substance Use Topics  . Alcohol use: No    Comment: wine occ  . Drug use: No    Types: Oxycodone    Comment: perscribed     Colonoscopy: 2016 Dr. Benson [place]  PAP: 3-4 years ago, h/o abnormal pap and LEEP x 2, hasn't returned for pap  Bone density:11/2017 osteoporosis--on prolia every 6 months   Allergies  Allergen Reactions  . Hydrocodone Itching         Current Outpatient Medications  Medication Sig Dispense Refill  . acetaminophen (TYLENOL) 500 MG tablet Take 2 tablets (1,000 mg total) by mouth every 8 (eight) hours. (Patient taking differently: Take 1,000 mg by mouth every 6 (six) hours as needed for moderate pain. ) 30 tablet 0  . albuterol (VENTOLIN HFA) 108 (90 Base) MCG/ACT inhaler Inhale 2 puffs into the lungs every 6 (six) hours as needed for wheezing or shortness of breath. 8 g 4  . calcitRIOL (ROCALTROL) 0.5 MCG capsule Take 0.5 mcg by mouth in the morning and at bedtime.     . clotrimazole (LOTRIMIN) 1 % cream APPLY TO AFFECTED AREA TWICE A DAY (Patient taking differently: Apply 1 application topically daily as needed (itching). ) 45 g 0  . cyanocobalamin (,VITAMIN B-12,) 1000 MCG/ML injection Inject 1 mL (1,000 mcg total) into the muscle every 30 (thirty) days. 1 mL 0  . cyclobenzaprine (FLEXERIL) 10 MG tablet Take 2 tablets (20 mg total) by mouth 2 (two) times daily. (Patient taking differently: Take 10 mg by mouth 3 (three) times daily as needed for muscle spasms. ) 80 tablet 0  . DULoxetine (CYMBALTA) 60 MG capsule Take 1 capsule (60 mg total) by mouth  daily. 30 capsule 0  . ELIQUIS 5 MG  TABS tablet TAKE 1 TABLET TWICE A DAY (Patient taking differently: Take 5 mg by mouth 2 (two) times daily. ) 180 tablet 3  . eszopiclone (LUNESTA) 2 MG TABS tablet Take 1 tablet (2 mg total) by mouth at bedtime. 20 tablet 0  . ferrous sulfate (FERROUSUL) 325 (65 FE) MG tablet Take 1 tablet (325 mg total) by mouth 3 (three) times daily with meals for 14 days. 42 tablet 0  . fluticasone furoate-vilanterol (BREO ELLIPTA) 100-25 MCG/INH AEPB Inhale 1 puff into the lungs daily. 180 each 0  . furosemide (LASIX) 20 MG tablet Take 1 tablet (20 mg total) by mouth as needed. (Patient taking differently: Take 20 mg by mouth daily. ) 90 tablet 2  . HYDROmorphone (DILAUDID) 2 MG tablet Take 1-2 tablets (2-4 mg total) by mouth every 4 (four) hours as needed for severe pain. 60 tablet 0  . levETIRAcetam (KEPPRA) 500 MG tablet Take 1 tablet (500 mg total) by mouth 2 (two) times daily. 180 tablet 1  . levothyroxine (SYNTHROID) 150 MCG tablet TAKE 1 TABLET BY MOUTH EVERY DAY (Patient taking differently: Take 150 mcg by mouth daily before breakfast. ) 90 tablet 1  . magnesium oxide (MAG-OX) 400 (241.3 Mg) MG tablet Take 1 tablet by mouth daily.    . metoprolol succinate (TOPROL-XL) 25 MG 24 hr tablet Take 25 mg by mouth 2 (two) times daily.    . NONFORMULARY OR COMPOUNDED ITEM [facility]:  Antifungal Cream - Terbinafine 3%, Fluconazole 2%, Tea Tree Oil 5%, Urea 10%, Ibuprofen 2% in DMSO Suspension #29m. Apply to affected toenail(s) once daily (at bedtime) or twice daily. (Patient taking differently: Apply 1 application topically at bedtime. [facility]:  Antifungal Cream - Terbinafine 3%, Fluconazole 2%, Tea Tree Oil 5%, Urea 10%, Ibuprofen 2% in DMSO Suspension #322m Apply to affected toenail(s) once daily (at bedtime) or twice daily.) 30 each 11  . polyethylene glycol (MIRALAX / GLYCOLAX) 17 g packet Take 17 g by mouth 2 (two) times daily. (Patient taking  differently: Take 17 g by mouth daily as needed for moderate constipation. ) 28 packet 0  . traZODone (DESYREL) 50 MG tablet Take 1 tablet (50 mg total) by mouth at bedtime. 30 tablet 0  . Vitamin D, Ergocalciferol, (DRISDOL) 1.25 MG (50000 UT) CAPS capsule Take 1 capsule (50,000 Units total) by mouth every Wednesday. 4 capsule 0   No current facility-administered medications for this visit.    OBJECTIVE:  Vitals:   05/15/19 0911 05/15/19 1008  BP: (!) 72/53 (P) 98/62  Pulse: 90   Resp: 18   Temp: 97.9 F (36.6 C)   SpO2: 95%      Body mass index is 34.05 kg/m.   Wt Readings from Last 3 Encounters:  05/15/19 217 lb 6.4 oz (98.6 kg)  05/13/19 218 lb (98.9 kg)  04/29/19 218 lb 8 oz (99.1 kg)  ECOG FS:1 - Symptomatic but completely ambulatory GENERAL: Patient is a well appearing female who appears tired, but no acute distress HEENT:  Sclerae anicteric.  Mask in place. Neck is supple.  NODES:  No cervical, supraclavicular, or axillary lymphadenopathy palpated.  BREAST EXAM:  Deferred. LUNGS:  Clear to auscultation bilaterally.  No wheezes or rhonchi. HEART:  Regular rate and rhythm. No murmur appreciated. ABDOMEN:  Soft, nontender.  Positive, normoactive bowel sounds. No organomegaly palpated. MSK:  No focal spinal tenderness to palpation.  EXTREMITIES:  No peripheral edema.   SKIN:  Clear with no obvious rashes or skin changes.  No nail dyscrasia. NEURO:  Nonfocal. Well oriented.  Appropriate affect.     LAB RESULTS:  CMP     Component Value Date/Time   NA 142 05/15/2019 0845   NA 141 11/20/2018 1548   K 4.1 05/15/2019 0845   CL 102 05/15/2019 0845   CO2 29 05/15/2019 0845   GLUCOSE 84 05/15/2019 0845   BUN 24 (H) 05/15/2019 0845   BUN 21 11/20/2018 1548   CREATININE 1.23 (H) 05/15/2019 0845   CREATININE 1.01 (H) 11/27/2017 1029   CALCIUM 8.5 (L) 05/15/2019 0845   CALCIUM 5.5 (LL) 07/10/2017 2104   PROT 6.7 05/15/2019 0845   PROT 6.8 06/19/2017 1222   ALBUMIN 3.2  (L) 05/15/2019 0845   ALBUMIN 3.8 06/19/2017 1222   AST 18 05/15/2019 0845   ALT 9 05/15/2019 0845   ALKPHOS 86 05/15/2019 0845   BILITOT 0.5 05/15/2019 0845   GFRNONAA 46 (L) 05/15/2019 0845   GFRNONAA 58 (L) 11/27/2017 1029   GFRAA 53 (L) 05/15/2019 0845   GFRAA 68 11/27/2017 1029    Lab Results  Component Value Date   TOTALPROTELP 7.0 06/01/2010   ALBUMINELP 53.7 (L) 06/01/2010   A1GS 4.7 06/01/2010   A2GS 10.9 06/01/2010   BETS 7.7 (H) 06/01/2010   BETA2SER 6.2 06/01/2010   GAMS 16.8 06/01/2010   MSPIKE NOT DET 06/01/2010   SPEI * 06/01/2010    No results found for: KPAFRELGTCHN, LAMBDASER, KAPLAMBRATIO  Lab Results  Component Value Date   WBC 5.8 05/15/2019   NEUTROABS 3.6 05/15/2019   HGB 8.9 (L) 05/15/2019   HCT 29.1 (L) 05/15/2019   MCV 95.1 05/15/2019   PLT 342 05/15/2019      Chemistry      Component Value Date/Time   NA 142 05/15/2019 0845   NA 141 11/20/2018 1548   K 4.1 05/15/2019 0845   CL 102 05/15/2019 0845   CO2 29 05/15/2019 0845   BUN 24 (H) 05/15/2019 0845   BUN 21 11/20/2018 1548   CREATININE 1.23 (H) 05/15/2019 0845   CREATININE 1.01 (H) 11/27/2017 1029   GLU 97 05/16/2016 0000      Component Value Date/Time   CALCIUM 8.5 (L) 05/15/2019 0845   CALCIUM 5.5 (LL) 07/10/2017 2104   ALKPHOS 86 05/15/2019 0845   AST 18 05/15/2019 0845   ALT 9 05/15/2019 0845   BILITOT 0.5 05/15/2019 0845       No results found for: LABCA2  No components found for: [id number]  No results for input(s): INR in the last 168 hours.  No results found for: LABCA2  No results found for: OVZ858  No results found for: IFO277  No results found for: AJO878  No results found for: CA2729  No components found for: HGQUANT  No results found for: CEA1 / No results found for: CEA1   No results found for: AFPTUMOR  No results found for: CHROMOGRNA  No results found for: PSA1  Appointment on 05/15/2019  Component Date Value Ref Range Status  .  Folate 05/15/2019 49.2  >5.9 ng/mL Final   Comment: RESULTS CONFIRMED BY MANUAL DILUTION Performed at [facility] [address], [place], [place] [address]   . CRP 05/15/2019 0.8  <1.0 mg/dL Final   Performed at [facility] [address] [place], [place] [address]  . Sed Rate 05/15/2019 23* 0 - 22 mm/hr Final   Performed at [facility], [place] [address] [place], [place] [address]  . LDH  05/15/2019 419* 98 - 192 U/L Final   Performed at [facility], [place] [address], [place], [place] [address]  . DAT, complement 05/15/2019 NEG   Final  . DAT, IgG 05/15/2019    Final                   Value:NEG Performed at [facility], [place] [address], [place], [place] [address]   Appointment on 05/15/2019  Component Date Value Ref Range Status  . WBC Count 05/15/2019 5.8  4.0 - 10.5 K/uL Final  . RBC 05/15/2019 3.06* 3.87 - 5.11 MIL/uL Final  . Hemoglobin 05/15/2019 8.9* 12.0 - 15.0 g/dL Final  . HCT 05/15/2019 29.1* 36.0 - 46.0 % Final  . MCV 05/15/2019 95.1  80.0 - 100.0 fL Final  . MCH 05/15/2019 29.1  26.0 - 34.0 pg Final  . MCHC 05/15/2019 30.6  30.0 - 36.0 g/dL Final  . RDW 05/15/2019 17.7* 11.5 - 15.5 % Final  . Platelet Count 05/15/2019 342  150 - 400 K/uL Final  . nRBC 05/15/2019 0.0  0.0 - 0.2 % Final  . Neutrophils Relative % 05/15/2019 62  % Final  . Neutro Abs 05/15/2019 3.6  1.7 - 7.7 K/uL Final  . Lymphocytes Relative 05/15/2019 21  % Final  . Lymphs Abs 05/15/2019 1.2  0.7 - 4.0 K/uL Final  . Monocytes Relative 05/15/2019 9  % Final  . Monocytes Absolute 05/15/2019 0.5  0.1 - 1.0 K/uL Final  . Eosinophils Relative 05/15/2019 7  % Final  . Eosinophils Absolute 05/15/2019 0.4  0.0 - 0.5 K/uL Final  . Basophils Relative 05/15/2019 1  % Final  . Basophils Absolute 05/15/2019 0.1  0.0 - 0.1 K/uL Final  . Immature Granulocytes 05/15/2019 0  % Final  . Abs Immature Granulocytes  05/15/2019 0.01  0.00 - 0.07 K/uL Final   Performed at [facility], [place] [address] [place], [place] [address]  . Sodium 05/15/2019 142  135 - 145 mmol/L Final  . Potassium 05/15/2019 4.1  3.5 - 5.1 mmol/L Final  . Chloride 05/15/2019 102  98 - 111 mmol/L Final  . CO2 05/15/2019 29  22 - 32 mmol/L Final  . Glucose, Bld 05/15/2019 84  70 - 99 mg/dL Final   Glucose reference range applies only to samples taken after fasting for at least 8 hours.  . BUN 05/15/2019 24* 8 - 23 mg/dL Final  . Creatinine 05/15/2019 1.23* 0.44 - 1.00 mg/dL Final  . Calcium 05/15/2019 8.5* 8.9 - 10.3 mg/dL Final  . Total Protein 05/15/2019 6.7  6.5 - 8.1 g/dL Final  . Albumin 05/15/2019 3.2* 3.5 - 5.0 g/dL Final  . AST 05/15/2019 18  15 - 41 U/L Final  . ALT 05/15/2019 9  0 - 44 U/L Final  . Alkaline Phosphatase 05/15/2019 86  38 - 126 U/L Final  . Total Bilirubin 05/15/2019 0.5  0.3 - 1.2 mg/dL Final  . GFR, Est Non Af Am 05/15/2019 46* >60 mL/min Final  . GFR, Est AFR Am 05/15/2019 53* >60 mL/min Final  . Anion gap 05/15/2019 11  5 - 15 Final   Performed at [facility], [place] [address] [place], [place] [address]  . Smear Review 05/15/2019 SMEAR STAINED AND AVAILABLE FOR REVIEW   Final   Performed at [facility], [address], [place], [place] [address]  . Retic Ct Pct 05/15/2019 2.3  0.4 - 3.1 %  Final  . RBC. 05/15/2019 3.02* 3.87 - 5.11 MIL/uL Final  . Retic Count, Absolute 05/15/2019 69.5  19.0 - 186.0 K/uL Final  . Immature Retic Fract 05/15/2019 13.8  2.3 - 15.9 % Final   Performed at [facility], [place] [address] [place], [place] [address]  . Vitamin B-12 05/15/2019 812  180 - 914 pg/mL Final   Comment: (NOTE) This assay is not validated for testing neonatal or myeloproliferative syndrome specimens for Vitamin B12 levels. Performed at [facility], [place] [address],  [place], [place] [address]   . Iron 05/15/2019 33* 41 - 142 ug/dL Final  . TIBC 05/15/2019 313  236 - 444 ug/dL Final  . Saturation Ratios 05/15/2019 10* 21 - 57 % Final  . UIBC 05/15/2019 280  120 - 384 ug/dL Final   Performed at [facility], [place] [address], [place], [place] [address]  . Ferritin 05/15/2019 117  11 - 307 ng/mL Final   Performed at [facility], [place] [address] [place], [place] [address]    (this displays the last labs from the last 3 days)  Lab Results  Component Value Date   TOTALPROTELP 7.0 06/01/2010   ALBUMINELP 53.7 (L) 06/01/2010   A1GS 4.7 06/01/2010   A2GS 10.9 06/01/2010   BETS 7.7 (H) 06/01/2010   BETA2SER 6.2 06/01/2010   GAMS 16.8 06/01/2010   MSPIKE NOT DET 06/01/2010   SPEI * 06/01/2010   (this displays SPEP labs)  No results found for: KPAFRELGTCHN, LAMBDASER, KAPLAMBRATIO (kappa/lambda light chains)  Lab Results  Component Value Date   HGBA 97.4 06/01/2010   HGBA2QUANT 2.6 06/01/2010   HGBFQUANT 0.0 06/01/2010   HGBSQUAN 0.0 06/01/2010   (Hemoglobinopathy evaluation)   Lab Results  Component Value Date   LDH 419 (H) 05/15/2019    Lab Results  Component Value Date   IRON 33 (L) 05/15/2019   TIBC 313 05/15/2019   IRONPCTSAT 10 (L) 05/15/2019   (Iron and TIBC)  Lab Results  Component Value Date   FERRITIN 117 05/15/2019    Urinalysis    Component Value Date/Time   COLORURINE YELLOW 01/03/2019 2248   APPEARANCEUR HAZY (A) 01/03/2019 2248   LABSPEC 1.026 01/03/2019 2248   PHURINE 5.0 01/03/2019 2248   GLUCOSEU NEGATIVE 01/03/2019 2248   HGBUR NEGATIVE 01/03/2019 2248   BILIRUBINUR NEGATIVE 01/03/2019 2248   KETONESUR 5 (A) 01/03/2019 2248   PROTEINUR NEGATIVE 01/03/2019 2248   UROBILINOGEN 1.0 06/06/2013 2154   NITRITE NEGATIVE 01/03/2019 2248   LEUKOCYTESUR TRACE (A) 01/03/2019 2248     STUDIES: CT Angio Head W or Wo Contrast  Result Date: 05/07/2019 CLINICAL DATA:   Severe head and neck pain EXAM: CT ANGIOGRAPHY HEAD AND NECK TECHNIQUE: Multidetector CT imaging of the head and neck was performed using the standard protocol during bolus administration of intravenous contrast. Multiplanar CT image reconstructions and MIPs were obtained to evaluate the vascular anatomy. Carotid stenosis measurements (when applicable) are obtained utilizing NASCET criteria, using the distal internal carotid diameter as the denominator. CONTRAST:  128m OMNIPAQUE IOHEXOL 350 MG/ML SOLN COMPARISON:  None. FINDINGS: CTA NECK FINDINGS Aortic arch: Great vessel origins are patent. There is direct origin of the left vertebral artery from the arch. Right carotid system: Patent. No measurable stenosis or evidence of dissection. Left carotid system: Patent. No measurable stenosis or evidence of dissection. Vertebral arteries: Patent. Right vertebral artery is dominant. No measurable stenosis or evidence of dissection. Skeleton:  Mild chronic upper thoracic spine compression deformities. Mild cervical spine degenerative changes. Other neck: No mass or adenopathy. Upper chest: No apical lung mass. Review of the MIP images confirms the above findings CTA HEAD FINDINGS Anterior circulation: Intracranial internal carotid arteries are patent. Anterior and middle cerebral arteries are patent. Posterior circulation: Intracranial vertebral arteries, basilar artery, and posterior cerebral arteries are patent. Bilateral posterior communicating arteries are identified. Venous sinuses: As permitted by contrast timing, patent. Review of the MIP images confirms the above findings IMPRESSION: No large vessel occlusion, hemodynamically significant stenosis, or evidence of dissection. Electronically Signed   By: Macy Mis M.D.   On: 05/07/2019 16:45   CT Head Wo Contrast  Result Date: 05/07/2019 CLINICAL DATA:  Headache. EXAM: CT HEAD WITHOUT CONTRAST TECHNIQUE: Contiguous axial images were obtained from the base of  the skull through the vertex without intravenous contrast. COMPARISON:  05/26/2018 FINDINGS: Brain: No evidence of acute infarction, hemorrhage, hydrocephalus, extra-axial collection or mass lesion/mass effect. Vascular: No hyperdense vessel or unexpected calcification. Skull: Normal. Negative for fracture or focal lesion. Sinuses/Orbits: Globes and orbits are unremarkable. Visualized sinuses and mastoid air cells are clear. Other: None. IMPRESSION: Normal unenhanced CT scan of the brain. Electronically Signed   By: Lajean Manes M.D.   On: 05/07/2019 10:50   CT Angio Neck W and/or Wo Contrast  Result Date: 05/07/2019 CLINICAL DATA:  Severe head and neck pain EXAM: CT ANGIOGRAPHY HEAD AND NECK TECHNIQUE: Multidetector CT imaging of the head and neck was performed using the standard protocol during bolus administration of intravenous contrast. Multiplanar CT image reconstructions and MIPs were obtained to evaluate the vascular anatomy. Carotid stenosis measurements (when applicable) are obtained utilizing NASCET criteria, using the distal internal carotid diameter as the denominator. CONTRAST:  173m OMNIPAQUE IOHEXOL 350 MG/ML SOLN COMPARISON:  None. FINDINGS: CTA NECK FINDINGS Aortic arch: Great vessel origins are patent. There is direct origin of the left vertebral artery from the arch. Right carotid system: Patent. No measurable stenosis or evidence of dissection. Left carotid system: Patent. No measurable stenosis or evidence of dissection. Vertebral arteries: Patent. Right vertebral artery is dominant. No measurable stenosis or evidence of dissection. Skeleton: Mild chronic upper thoracic spine compression deformities. Mild cervical spine degenerative changes. Other neck: No mass or adenopathy. Upper chest: No apical lung mass. Review of the MIP images confirms the above findings CTA HEAD FINDINGS Anterior circulation: Intracranial internal carotid arteries are patent. Anterior and middle cerebral arteries  are patent. Posterior circulation: Intracranial vertebral arteries, basilar artery, and posterior cerebral arteries are patent. Bilateral posterior communicating arteries are identified. Venous sinuses: As permitted by contrast timing, patent. Review of the MIP images confirms the above findings IMPRESSION: No large vessel occlusion, hemodynamically significant stenosis, or evidence of dissection. Electronically Signed   By: PMacy MisM.D.   On: 05/07/2019 16:45      ASSESSMENT: 69y.o. woman with multiple medical issues who is here today for evalution of her persistent anemia despite iron deficiency  1. 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 BMaia Petties (b) B12 injections are given monthly and her level is stable  (c) on an oral folate supplement and the level is normal  2. Persistent anemia despite adequate replacement of the above  (a) Reticulocyte count on 3/25 is low considering her ferritin is over 100  (b) further lab work up with: DAT, LDH, haptoglobin, ESR,  ANA, CRP  (c) bone marrow biopsy is the above is inconclusive  (d) possibly related to Keppra seizure medication  PLAN:  Jillian Eaton met with myself and Dr. Jana Hakim to review her anemia.  We reviewed with her the following:  All blood cells are made in the bone marrow, and there are three main types.    1. White Blood Cells: These are immune cells and Rhonda's are normal is appearance and number.    2. Platelets: These are clotting cells and Rhonda's are normal in number and appearance.    3. Red blood cells: These are oxygen carrying cells, and Rhonda's are decreased in number.  This is called anemia.  We reviewed with Jillian Eaton that there are two main causes of anemia--and Dr. Jana Hakim compared that to running out of money, either the money is all spent faster than you can earn it, or you just aren't making any.  The same case is  with her Red blood cells--she is either losing blood, and not able to produce RBCs to replace it, her immune system is destroying the blood cells, or, there is a production problem, and she isn't producing enough in her marrow.    Based on Rhonda's preliminary results which show a normal reticulocyte count--which we would expect to be elevated since her ferritin is normal today, we are going to get additional labs to evaluate for an autoimmune or inflammatory process.    In reviewing her medications, it is noted that she is taking Keppra for a seizure disorder that she was diagnosed with in 05/2018.  This could also be the cause of her anemia.    We discussed that she could have a red blood cell production problem and if that is the case then she will need a bone marrow biopsy in the future.    I applauded Jillian Eaton for her diet and exercise and weight loss of 200 pounds over the past couple of years.  I did recommend that she get back in with gynecology for a repeat pap due to her history of abnormal pap smears, particularly those requiring leep.    We will call Jillian Eaton once we have more information about her additional studies and discuss further f/u and management.  She was recommended to continue with the appropriate pandemic precautions. She knows to call for any questions that may arise between now and her next appointment.  We are happy to see her sooner if needed.  Total encounter time: 60 minutes*  Wilber Bihari, NP 05/15/19 4:41 PM Medical Oncology and Hematology [facility] [place], [place] [address] Tel. [phone]    Fax. [phone]   ADDENDUM: 69 year old [place] woman with normocytic anemia, and an inappropriately normal reticulocyte count; with a history of gastric bypass but corrected B12, folate, and iron; with a negative ANA, essentially normal sed rate and normal C-reactive protein, as well as a negative DAT, normal haptoglobin, and elevated  LDH.  We reviewed the blood film which does not show any immaturity in the white cell series.  Platelets are unremarkable.  The red cells also show no significant anisocytosis, no tail poikilocytes, and no nucleated red blood cells.  I doubt that we are dealing with an emerging myelodysplasia.  I think more likely we are seeing partial marrow suppression from the patient's Keppra.  This is the well-recognized complication of this medication and also of other antiseizure medications.  My recommendation then is that she work with her neurologist to change her  seizure prevention medication.  If after being off Keppra a month or 6weeks the hemoglobin has not shown signs of improvement we could proceed to bone marrow biopsy.  The patient tells [place] she would like to be established with a different neurologist and we will gladly facilitate that.  I personally saw this patient and performed a substantive portion of this encounter with the listed APP documented above.   Total encounter time 55 minutes.Chauncey Cruel, MD Medical Oncology and Hematology [facility] [address] [place], [place] [address] Tel. [phone]    Fax. [phone]   *Total Encounter Time as defined by the Centers for Medicare and Medicaid Services includes, in addition to the face-to-face time of a patient visit (documented in the note above) non-face-to-face time: obtaining and reviewing outside history, ordering and reviewing medications, tests or procedures, care coordination (communications with other health care professionals or caregivers) and documentation in the medical record.

## 2019-05-15 ENCOUNTER — Inpatient Hospital Stay: Payer: Medicare Other

## 2019-05-15 ENCOUNTER — Other Ambulatory Visit: Payer: Self-pay

## 2019-05-15 ENCOUNTER — Inpatient Hospital Stay: Payer: Medicare Other | Attending: Adult Health | Admitting: Adult Health

## 2019-05-15 ENCOUNTER — Encounter: Payer: Self-pay | Admitting: Adult Health

## 2019-05-15 VITALS — BP 72/53 | HR 90 | Temp 97.9°F | Resp 18 | Ht 67.0 in | Wt 217.4 lb

## 2019-05-15 DIAGNOSIS — E538 Deficiency of other specified B group vitamins: Secondary | ICD-10-CM | POA: Insufficient documentation

## 2019-05-15 DIAGNOSIS — D509 Iron deficiency anemia, unspecified: Secondary | ICD-10-CM | POA: Diagnosis not present

## 2019-05-15 DIAGNOSIS — D508 Other iron deficiency anemias: Secondary | ICD-10-CM

## 2019-05-15 DIAGNOSIS — M797 Fibromyalgia: Secondary | ICD-10-CM | POA: Diagnosis not present

## 2019-05-15 DIAGNOSIS — M810 Age-related osteoporosis without current pathological fracture: Secondary | ICD-10-CM | POA: Insufficient documentation

## 2019-05-15 DIAGNOSIS — Z79899 Other long term (current) drug therapy: Secondary | ICD-10-CM | POA: Insufficient documentation

## 2019-05-15 DIAGNOSIS — D638 Anemia in other chronic diseases classified elsewhere: Secondary | ICD-10-CM

## 2019-05-15 DIAGNOSIS — R5383 Other fatigue: Secondary | ICD-10-CM | POA: Insufficient documentation

## 2019-05-15 DIAGNOSIS — D513 Other dietary vitamin B12 deficiency anemia: Secondary | ICD-10-CM

## 2019-05-15 DIAGNOSIS — R0609 Other forms of dyspnea: Secondary | ICD-10-CM | POA: Insufficient documentation

## 2019-05-15 LAB — CBC WITH DIFFERENTIAL (CANCER CENTER ONLY)
Abs Immature Granulocytes: 0.01 10*3/uL (ref 0.00–0.07)
Basophils Absolute: 0.1 10*3/uL (ref 0.0–0.1)
Basophils Relative: 1 %
Eosinophils Absolute: 0.4 10*3/uL (ref 0.0–0.5)
Eosinophils Relative: 7 %
HCT: 29.1 % — ABNORMAL LOW (ref 36.0–46.0)
Hemoglobin: 8.9 g/dL — ABNORMAL LOW (ref 12.0–15.0)
Immature Granulocytes: 0 %
Lymphocytes Relative: 21 %
Lymphs Abs: 1.2 10*3/uL (ref 0.7–4.0)
MCH: 29.1 pg (ref 26.0–34.0)
MCHC: 30.6 g/dL (ref 30.0–36.0)
MCV: 95.1 fL (ref 80.0–100.0)
Monocytes Absolute: 0.5 10*3/uL (ref 0.1–1.0)
Monocytes Relative: 9 %
Neutro Abs: 3.6 10*3/uL (ref 1.7–7.7)
Neutrophils Relative %: 62 %
Platelet Count: 342 10*3/uL (ref 150–400)
RBC: 3.06 MIL/uL — ABNORMAL LOW (ref 3.87–5.11)
RDW: 17.7 % — ABNORMAL HIGH (ref 11.5–15.5)
WBC Count: 5.8 10*3/uL (ref 4.0–10.5)
nRBC: 0 % (ref 0.0–0.2)

## 2019-05-15 LAB — CMP (CANCER CENTER ONLY)
ALT: 9 U/L (ref 0–44)
AST: 18 U/L (ref 15–41)
Albumin: 3.2 g/dL — ABNORMAL LOW (ref 3.5–5.0)
Alkaline Phosphatase: 86 U/L (ref 38–126)
Anion gap: 11 (ref 5–15)
BUN: 24 mg/dL — ABNORMAL HIGH (ref 8–23)
CO2: 29 mmol/L (ref 22–32)
Calcium: 8.5 mg/dL — ABNORMAL LOW (ref 8.9–10.3)
Chloride: 102 mmol/L (ref 98–111)
Creatinine: 1.23 mg/dL — ABNORMAL HIGH (ref 0.44–1.00)
GFR, Est AFR Am: 53 mL/min — ABNORMAL LOW (ref >60)
GFR, Estimated: 46 mL/min — ABNORMAL LOW (ref >60)
Glucose, Bld: 84 mg/dL (ref 70–99)
Potassium: 4.1 mmol/L (ref 3.5–5.1)
Sodium: 142 mmol/L (ref 135–145)
Total Bilirubin: 0.5 mg/dL (ref 0.3–1.2)
Total Protein: 6.7 g/dL (ref 6.5–8.1)

## 2019-05-15 LAB — DIRECT ANTIGLOBULIN TEST (NOT AT ARMC)
DAT, IgG: NEGATIVE
DAT, complement: NEGATIVE

## 2019-05-15 LAB — RETICULOCYTES
Immature Retic Fract: 13.8 % (ref 2.3–15.9)
RBC.: 3.02 MIL/uL — ABNORMAL LOW (ref 3.87–5.11)
Retic Count, Absolute: 69.5 10*3/uL (ref 19.0–186.0)
Retic Ct Pct: 2.3 % (ref 0.4–3.1)

## 2019-05-15 LAB — VITAMIN B12: Vitamin B-12: 812 pg/mL (ref 180–914)

## 2019-05-15 LAB — LACTATE DEHYDROGENASE: LDH: 419 U/L — ABNORMAL HIGH (ref 98–192)

## 2019-05-15 LAB — SAVE SMEAR(SSMR), FOR PROVIDER SLIDE REVIEW

## 2019-05-15 LAB — C-REACTIVE PROTEIN: CRP: 0.8 mg/dL (ref <1.0)

## 2019-05-15 LAB — SEDIMENTATION RATE: Sed Rate: 23 mm/hr — ABNORMAL HIGH (ref 0–22)

## 2019-05-15 LAB — IRON AND TIBC
Iron: 33 ug/dL — ABNORMAL LOW (ref 41–142)
Saturation Ratios: 10 % — ABNORMAL LOW (ref 21–57)
TIBC: 313 ug/dL (ref 236–444)
UIBC: 280 ug/dL (ref 120–384)

## 2019-05-15 LAB — FERRITIN: Ferritin: 117 ng/mL (ref 11–307)

## 2019-05-15 LAB — FOLATE: Folate: 49.2 ng/mL (ref >5.9)

## 2019-05-16 ENCOUNTER — Other Ambulatory Visit: Payer: Self-pay | Admitting: Adult Health

## 2019-05-16 ENCOUNTER — Telehealth: Payer: Self-pay | Admitting: Adult Health

## 2019-05-16 DIAGNOSIS — M25561 Pain in right knee: Secondary | ICD-10-CM | POA: Diagnosis not present

## 2019-05-16 DIAGNOSIS — R569 Unspecified convulsions: Secondary | ICD-10-CM

## 2019-05-16 LAB — ANTINUCLEAR ANTIBODIES, IFA: ANA Ab, IFA: NEGATIVE

## 2019-05-16 LAB — FOLATE RBC
Folate, Hemolysate: 427 ng/mL
Folate, RBC: 1472 ng/mL (ref >498)
Hematocrit: 29 % — ABNORMAL LOW (ref 34.0–46.6)

## 2019-05-16 LAB — HAPTOGLOBIN: Haptoglobin: <10 mg/dL — ABNORMAL LOW (ref 37–355)

## 2019-05-16 NOTE — Telephone Encounter (Signed)
No 3/25 los. No changes made to pt schedule.  °

## 2019-05-16 NOTE — Progress Notes (Signed)
Reviewed patient labs with Dr. Jana Hakim.  After discussion, he would like patient to talk with neurology about changing Keppra to a different seizure medication as Keppra can lead to decreased counts.  If no improvement after medication change (4-6 weeks later), then bone marrow biopsy is recommended.   I reviewed this with Jillian Eaton who is in agreement, and needs a referral to see a neurologist.  I placed a referral to Dr. Delice Lesch.    Wilber Bihari, NP

## 2019-05-19 ENCOUNTER — Telehealth: Payer: Self-pay

## 2019-05-19 NOTE — Telephone Encounter (Signed)
Returned TC to pt after receiving her voicemail stating that she spoke with Mendel Ryder of [facility] and they discussed her having some issues with her Keppra medication. Pt wanted to know if she could stop keppra and take something else or does she have to stay on keppra if it is causing adverse affects. Spoke with Mendel Ryder who stated that she advises that patient does not stop taking the keppra medication. I called patient back and let her know not to stop keppra and to continue taking it per Wilber Bihari NP. Pt verbalized understanding. No further problems or concerns at this time.

## 2019-05-21 DIAGNOSIS — M25561 Pain in right knee: Secondary | ICD-10-CM | POA: Diagnosis not present

## 2019-05-23 DIAGNOSIS — M25561 Pain in right knee: Secondary | ICD-10-CM | POA: Diagnosis not present

## 2019-05-27 DIAGNOSIS — M25561 Pain in right knee: Secondary | ICD-10-CM | POA: Diagnosis not present

## 2019-05-28 ENCOUNTER — Ambulatory Visit: Payer: Medicare Other | Admitting: Podiatry

## 2019-05-30 ENCOUNTER — Telehealth: Payer: Self-pay | Admitting: Pulmonary Disease

## 2019-05-30 ENCOUNTER — Encounter: Payer: Self-pay | Admitting: Pulmonary Disease

## 2019-05-30 DIAGNOSIS — J302 Other seasonal allergic rhinitis: Secondary | ICD-10-CM | POA: Diagnosis not present

## 2019-05-30 DIAGNOSIS — R05 Cough: Secondary | ICD-10-CM | POA: Diagnosis not present

## 2019-05-30 NOTE — Telephone Encounter (Signed)
I spoke with the pt and notified of recs per Aaron Edelman and she verbalized understanding. Nothing further needed.

## 2019-05-30 NOTE — Telephone Encounter (Signed)
05/30/2019  Is patient taking a daily antihistamine, if not would recommend:  Please start taking a daily antihistamine:  >>>choose one of: zyrtec, claritin, allegra, or xyzal  >>>these are over the counter medications  >>>can choose generic option  >>>take daily  >>>this medication helps with allergies, post nasal drip, and cough   This patient using nasal sprays if not would recommend:  Flonase 1 spray each nostril 1-2 times daily as needed for nasal congestion allergy-like symptoms  Also start nasal saline rinses 1-2 times daily prior to using Flonase.  Patient symptoms continue to worsen despite these interventions then likely will need to consider additional interventions and patient will need to call our office.  Also may need to consider increasing patient's Breo inhaler.  Likely patient is an allergic rhinitis flare given the high pollen counts and her history of asthma.  Wyn Quaker, FNP

## 2019-05-30 NOTE — Telephone Encounter (Signed)
Spoke with the pt  She is c/o waking up in the night with cough for the past 2 wks  She states not really wheezing, but has noticed some increased SOB recently  Her cough is prod with clear sputum  She uses her Breo inhaler daily and her albuterol inhaler in the night a few x per wk and this helps some  She is not having any f/c/s, chest tightness, other co's  No appt's available  Please advise, thanks

## 2019-06-02 ENCOUNTER — Emergency Department (HOSPITAL_COMMUNITY): Payer: Medicare Other

## 2019-06-02 ENCOUNTER — Other Ambulatory Visit: Payer: Self-pay

## 2019-06-02 ENCOUNTER — Encounter (HOSPITAL_COMMUNITY): Payer: Self-pay | Admitting: Emergency Medicine

## 2019-06-02 ENCOUNTER — Emergency Department (HOSPITAL_COMMUNITY)
Admission: EM | Admit: 2019-06-02 | Discharge: 2019-06-02 | Disposition: A | Discharge status: Home / Self Care | Payer: Medicare Other | Attending: Emergency Medicine | Admitting: Emergency Medicine

## 2019-06-02 DIAGNOSIS — R0602 Shortness of breath: Secondary | ICD-10-CM | POA: Diagnosis not present

## 2019-06-02 DIAGNOSIS — R0600 Dyspnea, unspecified: Secondary | ICD-10-CM | POA: Diagnosis not present

## 2019-06-02 DIAGNOSIS — R000 Tachycardia, unspecified: Secondary | ICD-10-CM | POA: Insufficient documentation

## 2019-06-02 DIAGNOSIS — D649 Anemia, unspecified: Secondary | ICD-10-CM | POA: Diagnosis not present

## 2019-06-02 DIAGNOSIS — Z5321 Procedure and treatment not carried out due to patient leaving prior to being seen by health care provider: Secondary | ICD-10-CM | POA: Diagnosis not present

## 2019-06-02 MED ORDER — SODIUM CHLORIDE 0.9% FLUSH: 3.0000 mL | Freq: Once | INTRAVENOUS | Status: DC

## 2019-06-02 NOTE — ED Triage Notes (Signed)
Pt c/o sob x 2-3 weeks, heart racing when gets up and moves around started yesterday. Pt reports stayed in bed yesterday thinking would be able to go to PT today (for knee replacement March 9) but feeling too bad.  Hx a fib takes Eliquis

## 2019-06-02 NOTE — ED Notes (Signed)
Called for pt x1 in ED lobby re: labs- no answer. [employer]

## 2019-06-02 NOTE — ED Notes (Signed)
Pt called in ED lobby for labs x3. Pt advised. [employer]

## 2019-06-05 ENCOUNTER — Encounter (HOSPITAL_COMMUNITY): Payer: Self-pay | Admitting: Emergency Medicine

## 2019-06-05 ENCOUNTER — Emergency Department (HOSPITAL_COMMUNITY)
Admission: EM | Admit: 2019-06-05 | Discharge: 2019-06-05 | Disposition: A | Discharge status: Home / Self Care | Payer: Medicare Other | Attending: Emergency Medicine | Admitting: Emergency Medicine

## 2019-06-05 ENCOUNTER — Other Ambulatory Visit: Payer: Self-pay

## 2019-06-05 ENCOUNTER — Emergency Department (HOSPITAL_COMMUNITY): Payer: Medicare Other

## 2019-06-05 DIAGNOSIS — Z7901 Long term (current) use of anticoagulants: Secondary | ICD-10-CM | POA: Diagnosis not present

## 2019-06-05 DIAGNOSIS — I4891 Unspecified atrial fibrillation: Secondary | ICD-10-CM | POA: Diagnosis not present

## 2019-06-05 DIAGNOSIS — J4530 Mild persistent asthma, uncomplicated: Secondary | ICD-10-CM | POA: Diagnosis not present

## 2019-06-05 DIAGNOSIS — R0602 Shortness of breath: Secondary | ICD-10-CM | POA: Diagnosis not present

## 2019-06-05 DIAGNOSIS — Z20822 Contact with and (suspected) exposure to covid-19: Secondary | ICD-10-CM | POA: Insufficient documentation

## 2019-06-05 DIAGNOSIS — Z79899 Other long term (current) drug therapy: Secondary | ICD-10-CM | POA: Insufficient documentation

## 2019-06-05 DIAGNOSIS — R002 Palpitations: Secondary | ICD-10-CM | POA: Diagnosis not present

## 2019-06-05 DIAGNOSIS — E890 Postprocedural hypothyroidism: Secondary | ICD-10-CM | POA: Diagnosis not present

## 2019-06-05 DIAGNOSIS — E860 Dehydration: Secondary | ICD-10-CM | POA: Diagnosis not present

## 2019-06-05 DIAGNOSIS — Z96653 Presence of artificial knee joint, bilateral: Secondary | ICD-10-CM | POA: Insufficient documentation

## 2019-06-05 DIAGNOSIS — I5032 Chronic diastolic (congestive) heart failure: Secondary | ICD-10-CM | POA: Diagnosis not present

## 2019-06-05 DIAGNOSIS — I110 Hypertensive heart disease with heart failure: Secondary | ICD-10-CM | POA: Insufficient documentation

## 2019-06-05 DIAGNOSIS — R0902 Hypoxemia: Secondary | ICD-10-CM | POA: Diagnosis not present

## 2019-06-05 DIAGNOSIS — I499 Cardiac arrhythmia, unspecified: Secondary | ICD-10-CM | POA: Diagnosis present

## 2019-06-05 DIAGNOSIS — R0600 Dyspnea, unspecified: Secondary | ICD-10-CM | POA: Diagnosis not present

## 2019-06-05 LAB — CBC WITH DIFFERENTIAL/PLATELET
Abs Immature Granulocytes: 0.1 10*3/uL — ABNORMAL HIGH (ref 0.00–0.07)
Basophils Absolute: 0 10*3/uL (ref 0.0–0.1)
Basophils Relative: 0 %
Eosinophils Absolute: 0 10*3/uL (ref 0.0–0.5)
Eosinophils Relative: 0 %
HCT: 31.1 % — ABNORMAL LOW (ref 36.0–46.0)
Hemoglobin: 9.5 g/dL — ABNORMAL LOW (ref 12.0–15.0)
Immature Granulocytes: 1 %
Lymphocytes Relative: 4 %
Lymphs Abs: 0.7 10*3/uL (ref 0.7–4.0)
MCH: 27.6 pg (ref 26.0–34.0)
MCHC: 30.5 g/dL (ref 30.0–36.0)
MCV: 90.4 fL (ref 80.0–100.0)
Monocytes Absolute: 0.7 10*3/uL (ref 0.1–1.0)
Monocytes Relative: 4 %
Neutro Abs: 16.1 10*3/uL — ABNORMAL HIGH (ref 1.7–7.7)
Neutrophils Relative %: 91 %
Platelets: 322 10*3/uL (ref 150–400)
RBC: 3.44 MIL/uL — ABNORMAL LOW (ref 3.87–5.11)
RDW: 15.8 % — ABNORMAL HIGH (ref 11.5–15.5)
WBC: 17.7 10*3/uL — ABNORMAL HIGH (ref 4.0–10.5)
nRBC: 0 % (ref 0.0–0.2)

## 2019-06-05 LAB — PROTIME-INR
INR: 1.5 — ABNORMAL HIGH (ref 0.8–1.2)
Prothrombin Time: 18.1 seconds — ABNORMAL HIGH (ref 11.4–15.2)

## 2019-06-05 LAB — BASIC METABOLIC PANEL
Anion gap: 14 (ref 5–15)
BUN: 19 mg/dL (ref 8–23)
CO2: 27 mmol/L (ref 22–32)
Calcium: 8.3 mg/dL — ABNORMAL LOW (ref 8.9–10.3)
Chloride: 98 mmol/L (ref 98–111)
Creatinine, Ser: 1.55 mg/dL — ABNORMAL HIGH (ref 0.44–1.00)
GFR calc Af Amer: 40 mL/min — ABNORMAL LOW (ref >60)
GFR calc non Af Amer: 35 mL/min — ABNORMAL LOW (ref >60)
Glucose, Bld: 82 mg/dL (ref 70–99)
Potassium: 3.8 mmol/L (ref 3.5–5.1)
Sodium: 139 mmol/L (ref 135–145)

## 2019-06-05 LAB — MAGNESIUM: Magnesium: 1.7 mg/dL (ref 1.7–2.4)

## 2019-06-05 MED ORDER — SODIUM CHLORIDE 0.9 % IV BOLUS
1000.0000 mL | Freq: Once | INTRAVENOUS | Status: AC
  Administered 2019-06-05: 1000 mL via INTRAVENOUS

## 2019-06-05 NOTE — ED Notes (Signed)
Patient daughter calling asking for a call back Would like update on patient [place]

## 2019-06-05 NOTE — ED Provider Notes (Signed)
[facility] EMERGENCY DEPARTMENT Provider Note   CSN: [id number] Arrival date & time: 06/05/19  1811     History Chief Complaint  Patient presents with  . Irregular Heart Beat    Jillian Eaton is a 69 y.o. female.  HPI    69 year old female with past medical history of HFpEF, asthma, HTN, A Fib on Eliquis presenting to the emerge department complaining of progressively worsening shortness of breath over the past several weeks.  Patient reports that she has had bilateral total knee replacements with the left most recently in March 2021.  The patient reports that she comes short of breath with any exertion even moving around in bed.  Patient denies any orthopnea, chest pain, fevers, cough, congestion, rhinorrhea, abdominal pain, changes in bowel or bladder function.  Patient notes having dark stools but is taking an iron supplement.  Patient denies any bright red blood per rectum or vaginal bleeding.  Patient notes no easy bruising or bleeding elsewhere.The patient denies any lower extremity swelling or pain. She did not take her metoprolol this morning because her blood pressure was low.  Patient notes having intermittent palpitations with her A. fib increasing in rate to the 140s to 150s.  EMS reported the patient's heart rate improved following 500 cc of crystalloid.   Past Medical History:  Diagnosis Date  . Anemia    iron and pernicious  . Arthritis   . Asthma    flare up last yr  . CHF (congestive heart failure) ([place])   . Chronic headache    " better now "   . Complication of anesthesia    BP dropped   . DOE (dyspnea on exertion)    2D ECHO, 02/12/2012 - EF 60-65%, moderate concentric hypertrophy  . Dysrhythmia   . Fibromyalgia    nerve pain"left side at waist level" "can't lay on that side without pain" , "HOB elevation helps"; pt. thinks this has resolved after 200lb weight loss9-17- since i lost the wieght , i dont  think i have this anymore     . Heart murmur   . Hematuria - cause not known    resolved   . History of kidney stones    x 2 '13, '14 surgery to remove  . Hypertension   . Pneumonia    aspiration  04/2016  . SBO (small bowel obstruction) ([place]) 06/07/2013  . SBO (small bowel obstruction) ([place])    rqueired admission 2020  . Thyroid disease    "goiter"  . Transfusion history    10 yrs+    Patient Active Problem List   Diagnosis Date Noted  . S/P right TKA 04/29/2019  . Medication management 03/11/2019  . Stiffness of left knee 12/12/2018  . Acute on chronic respiratory failure with hypoxia ([place]) 12/07/2018  . Hypothyroidism 12/07/2018  . Normocytic anemia 12/07/2018  . Asthma 12/07/2018  . S/P left TKA 11/14/2018  . Status post total left knee replacement 11/14/2018  . Degenerative joint disease involving multiple joints on both sides of body 07/31/2018  . Rotator cuff tear arthropathy of right shoulder 04/05/2018  . Overactive bladder 04/05/2018  . Steroid-induced hyperglycemia   . Generalized OA   . 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 NICM (nonischemic cardiomyopathy) ([place]) 01/15/2018  . Osteoporosis 12/13/2017  . Rotator cuff tear arthropathy 11/29/2017  . Chronic diastolic CHF (congestive heart failure) ([place]) 09/28/2017  . Acute renal failure superimposed on stage 3 chronic kidney disease ([place]) 09/07/2017  .  Hypomagnesemia with secondary hypocalcemia 09/07/2017  . 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 Hypercapnemia 07/13/2017  . AKI (acute kidney injury) ([place]) 07/10/2017  . Vertigo 07/08/2017  . Blurring of visual image 07/08/2017  . On anticoagulant therapy 06/19/2017  . Chronic respiratory failure with hypoxia ([place]) 04/19/2017  . GERD (gastroesophageal reflux disease) 08/28/2016  . 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 Depressive disorder 08/28/2016  . Iatrogenic hypocalcemia 08/28/2016  . Chronic pain syndrome   . Acute diastolic (congestive) heart failure  ([place])   . Oropharyngeal dysphagia   . Chronic atrial fibrillation ([place]) 04/28/2016  . Vocal cord dysfunction 04/28/2016  . Thrombocytopenia ([place]) 04/28/2016  . Seizures ([place])   . Preoperative cardiovascular examination   . 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  . Leg swelling 10/19/2015  . Acute on chronic respiratory failure with hypoxia and hypercapnia ([place]) 08/03/2015  . Bilateral leg edema 05/11/2015  . Essential hypertension 05/11/2015  . Nephrolithiasis 04/12/2015  . Numbness in both hands 04/12/2015  . Complete tear of left rotator cuff 11/19/2014  . Primary osteoarthritis of both knees 03/24/2014  . Chronic asthmatic bronchitis ([place]) 07/30/2013  . Bariatric surgery status 07/30/2013  . 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 Urge incontinence of urine 07/30/2013  . Vitamin D deficiency 07/30/2013  . B-complex deficiency 07/30/2013  . Cough 07/30/2013  . Gross hematuria 07/30/2013  . Goiter 07/30/2013  . Palpitations 07/30/2013  . Right flank pain 07/30/2013  . SBO (small bowel obstruction) ([place]) 06/07/2013  . Pulmonary HTN ([place]) 01/08/2013  . Insomnia 03/22/2012  . Mild intermittent asthma with acute exacerbation 03/22/2012  . Fibromyalgia 10/26/2011  . Anemia of chronic disease 03/28/2011  . Obesity (BMI 30-39.9) 09/22/2010    Past Surgical History:  Procedure Laterality Date  . ABDOMINAL ADHESION SURGERY  2004   open LOA - in [place]  . CARDIAC CATHETERIZATION  04/04/2010   No significant obstructive coronary artery disease  . CARDIOVERSION N/A 08/08/2017   Procedure: CARDIOVERSION;  Surgeon: Pixie Casino, MD;  Location: [facility];  Service: Cardiovascular;  Laterality: N/A;  . CHOLECYSTECTOMY  1990  . COLONOSCOPY W/ POLYPECTOMY    . COLONOSCOPY WITH PROPOFOL N/A 04/10/2014   Procedure: COLONOSCOPY WITH PROPOFOL;  Surgeon: Beryle Beams, MD;  Location: [facility];  Service: Endoscopy;  Laterality: N/A;  . EYE SURGERY     lasik 20-25 yrs ago  .  GASTROPLASTY VERTICAL BANDED  1985  . [place]   In [place] for SBO  . REVERSE SHOULDER ARTHROPLASTY Right 03/29/2018   Procedure: REVERSE SHOULDER ARTHROPLASTY;  Surgeon: Netta Cedars, MD;  Location: [facility];  Service: Orthopedics;  Laterality: Right;  . ROUX-EN-Y GASTRIC BYPASS  1992   Conversion VBG to RnYGB in [place], [place]  . SHOULDER ARTHROSCOPY WITH SUBACROMIAL DECOMPRESSION  2016   Dr Berenice Primas, [place], [place]  . SMALL INTESTINE SURGERY  2000   SBO - LOA w SB resection  . TOTAL KNEE ARTHROPLASTY Left 11/14/2018   Procedure: TOTAL KNEE ARTHROPLASTY;  Surgeon: Paralee Cancel, MD;  Location: [facility];  Service: Orthopedics;  Laterality: Left;  70 mins  . TOTAL KNEE ARTHROPLASTY Right 04/29/2019   Procedure: TOTAL KNEE ARTHROPLASTY;  Surgeon: Paralee Cancel, MD;  Location: [facility];  Service: Orthopedics;  Laterality: Right;  70 mins  . TOTAL THYROIDECTOMY  06/26/2012   Dr Celine Ahr, [facility]  . TUBAL LIGATION  1986  . URETHRAL DILATION  2012   w cystoscopy.  Dr Gaynelle Arabian  . UTERINE  FIBROID EMBOLIZATION       OB History   No obstetric history on file.     Family History  Problem Relation Age of Onset  . Diabetes Mother   . Epilepsy Mother   . Cancer Mother        Breast  . Hypertension Mother   . Breast cancer Mother   . Seizures Mother   . Kidney disease Father   . Diabetes Father   . Hypertension Father   . Asthma Father   . Heart disease Father   . Epilepsy Sister   . Cancer Maternal Grandmother   . Breast cancer Maternal Grandmother   . Cancer Paternal Grandmother   . Breast cancer Paternal Grandmother     Social History   Tobacco Use  . Smoking status: Never Smoker  . Smokeless tobacco: Never Used  Substance Use Topics  . Alcohol use: No    Comment: wine occ  . Drug use: No    Types: Oxycodone    Comment: perscribed    Home Medications Prior to Admission medications   Medication Sig Start Date End Date Taking?  Authorizing Provider  acetaminophen (TYLENOL) 500 MG tablet Take 2 tablets (1,000 mg total) by mouth every 8 (eight) hours. Patient taking differently: Take 1,000 mg by mouth every 6 (six) hours as needed for moderate pain.  04/30/19   Danae Orleans, PA-C  albuterol (VENTOLIN HFA) 108 (90 Base) MCG/ACT inhaler Inhale 2 puffs into the lungs every 6 (six) hours as needed for wheezing or shortness of breath. 03/11/19   Lauraine Rinne, NP  calcitRIOL (ROCALTROL) 0.5 MCG capsule Take 0.5 mcg by mouth in the morning and at bedtime.     [provider]  clotrimazole (LOTRIMIN) 1 % cream APPLY TO AFFECTED AREA TWICE A DAY Patient taking differently: Apply 1 application topically daily as needed (itching).  02/07/19   Marzetta Board, DPM  cyanocobalamin (,VITAMIN B-12,) 1000 MCG/ML injection Inject 1 mL (1,000 mcg total) into the muscle every 30 (thirty) days. 04/05/18   Hennie Duos, MD  cyclobenzaprine (FLEXERIL) 10 MG tablet Take 2 tablets (20 mg total) by mouth 2 (two) times daily. Patient taking differently: Take 10 mg by mouth 3 (three) times daily as needed for muscle spasms.  04/05/18   Hennie Duos, MD  DULoxetine (CYMBALTA) 60 MG capsule Take 1 capsule (60 mg total) by mouth daily. 04/05/18   Hennie Duos, MD  ELIQUIS 5 MG TABS tablet TAKE 1 TABLET TWICE A DAY Patient taking differently: Take 5 mg by mouth 2 (two) times daily.  02/05/19   Hilty, Nadean Corwin, MD  eszopiclone (LUNESTA) 2 MG TABS tablet Take 1 tablet (2 mg total) by mouth at bedtime. 04/05/18   Hennie Duos, MD  ferrous sulfate (FERROUSUL) 325 (65 FE) MG tablet Take 1 tablet (325 mg total) by mouth 3 (three) times daily with meals for 14 days. 04/30/19 05/14/19  Danae Orleans, PA-C  fluticasone furoate-vilanterol (BREO ELLIPTA) 100-25 MCG/INH AEPB Inhale 1 puff into the lungs daily. 03/14/19   Lauraine Rinne, NP  furosemide (LASIX) 20 MG tablet Take 1 tablet (20 mg total) by mouth as needed. Patient taking  differently: Take 20 mg by mouth daily.  01/24/19   Hilty, Nadean Corwin, MD  HYDROmorphone (DILAUDID) 2 MG tablet Take 1-2 tablets (2-4 mg total) by mouth every 4 (four) hours as needed for severe pain. 04/30/19   Danae Orleans, PA-C  levETIRAcetam (KEPPRA) 500 MG tablet  Take 1 tablet (500 mg total) by mouth 2 (two) times daily. 10/02/18   Suzzanne Cloud, NP  levothyroxine (SYNTHROID) 150 MCG tablet TAKE 1 TABLET BY MOUTH EVERY DAY Patient taking differently: Take 150 mcg by mouth daily before breakfast.  04/07/19   Philemon Kingdom, MD  magnesium oxide (MAG-OX) 400 (241.3 Mg) MG tablet Take 1 tablet by mouth daily. 04/29/19   [provider]  metoprolol succinate (TOPROL-XL) 25 MG 24 hr tablet Take 25 mg by mouth 2 (two) times daily.    [provider]  NONFORMULARY OR COMPOUNDED ITEM [facility]:  Antifungal Cream - Terbinafine 3%, Fluconazole 2%, Tea Tree Oil 5%, Urea 10%, Ibuprofen 2% in DMSO Suspension #74ml. Apply to affected toenail(s) once daily (at bedtime) or twice daily. Patient taking differently: Apply 1 application topically at bedtime. [facility]:  Antifungal Cream - Terbinafine 3%, Fluconazole 2%, Tea Tree Oil 5%, Urea 10%, Ibuprofen 2% in DMSO Suspension #31ml. Apply to affected toenail(s) once daily (at bedtime) or twice daily. 05/07/18   Marzetta Board, DPM  polyethylene glycol (MIRALAX / GLYCOLAX) 17 g packet Take 17 g by mouth 2 (two) times daily. Patient taking differently: Take 17 g by mouth daily as needed for moderate constipation.  04/30/19   Danae Orleans, PA-C  traZODone (DESYREL) 50 MG tablet Take 1 tablet (50 mg total) by mouth at bedtime. 04/05/18   Hennie Duos, MD  Vitamin D, Ergocalciferol, (DRISDOL) 1.25 MG (50000 UT) CAPS capsule Take 1 capsule (50,000 Units total) by mouth every Wednesday. 04/10/18   Hennie Duos, MD    Allergies    Hydrocodone  Review of Systems   Review of Systems  Constitutional: Positive for  activity change, chills and fatigue. Negative for appetite change, diaphoresis and fever.  HENT: Negative for congestion and rhinorrhea.   Respiratory: Positive for shortness of breath. Negative for cough and wheezing.   Cardiovascular: Positive for palpitations. Negative for chest pain.  Gastrointestinal: Negative for abdominal distention, abdominal pain, diarrhea, nausea and vomiting.  Genitourinary: Negative for decreased urine volume, difficulty urinating, dysuria, frequency and urgency.  Musculoskeletal: Negative for gait problem.  Skin: Positive for pallor. Negative for color change and wound.  Neurological: Negative for dizziness, syncope, weakness, light-headedness and headaches.  All other systems reviewed and are negative.   Physical Exam Updated Vital Signs BP 106/80 (BP Location: Right Arm)   Pulse (!) 106   Temp 98.4 F (36.9 C) (Oral)   Resp 20   Ht 5\' 7"  (1.702 m)   Wt 92.1 kg   SpO2 98%   BMI 31.79 kg/m   Physical Exam Vitals and nursing note reviewed.  Constitutional:      General: She is not in acute distress.    Appearance: Normal appearance. She is normal weight. She is not ill-appearing or toxic-appearing.  HENT:     Head: Normocephalic.     Right Ear: External ear normal.     Left Ear: External ear normal.     Nose: Nose normal.     Mouth/Throat:     Mouth: Mucous membranes are moist.     Pharynx: Oropharynx is clear.  Eyes:     Extraocular Movements: Extraocular movements intact.     Pupils: Pupils are equal, round, and reactive to light.     Comments: Conjunctival pallor  Cardiovascular:     Rate and Rhythm: Normal rate and regular rhythm.     Pulses: Normal pulses.     Heart sounds: Normal  heart sounds.  Pulmonary:     Effort: Pulmonary effort is normal. No respiratory distress.     Breath sounds: Normal breath sounds. No wheezing or rhonchi.  Abdominal:     General: Bowel sounds are normal.     Palpations: Abdomen is soft.     Tenderness:  There is no abdominal tenderness. There is no guarding.  Musculoskeletal:     Cervical back: Normal range of motion.     Right lower leg: No edema.     Left lower leg: No edema.  Skin:    General: Skin is warm and dry.     Capillary Refill: Capillary refill takes 2 to 3 seconds.     Coloration: Skin is pale.  Neurological:     General: No focal deficit present.     Mental Status: She is alert and oriented to person, place, and time. Mental status is at baseline.  Psychiatric:        Mood and Affect: Mood normal.     ED Results / Procedures / Treatments   Labs (all labs ordered are listed, but only abnormal results are displayed) Labs Reviewed  CBC WITH DIFFERENTIAL/PLATELET - Abnormal; Notable for the following components:      Result Value   WBC 17.7 (*)    RBC 3.44 (*)    Hemoglobin 9.5 (*)    HCT 31.1 (*)    RDW 15.8 (*)    Neutro Abs 16.1 (*)    Abs Immature Granulocytes 0.10 (*)    All other components within normal limits  BASIC METABOLIC PANEL - Abnormal; Notable for the following components:   Creatinine, Ser 1.55 (*)    Calcium 8.3 (*)    GFR calc non Af Amer 35 (*)    GFR calc Af Amer 40 (*)    All other components within normal limits  PROTIME-INR - Abnormal; Notable for the following components:   Prothrombin Time 18.1 (*)    INR 1.5 (*)    All other components within normal limits  SARS CORONAVIRUS 2 (TAT 6-24 HRS)  MAGNESIUM    EKG None  Radiology DG Chest Portable 1 View  Result Date: 06/05/2019 CLINICAL DATA:  Arrhythmia, short of breath EXAM: PORTABLE CHEST 1 VIEW COMPARISON:  06/02/2019 FINDINGS: Single frontal view of the chest again demonstrates an enlarged cardiac silhouette. No airspace disease, effusion, or pneumothorax. No acute bony abnormalities. IMPRESSION: 1. Stable enlarged cardiac silhouette.  No acute process. Electronically Signed   By: Randa Ngo M.D.   On: 06/05/2019 19:03    Procedures Procedures (including critical care  time)  Medications Ordered in ED Medications  sodium chloride 0.9 % bolus 1,000 mL (0 mLs Intravenous Stopped 06/05/19 2133)    ED Course  I have reviewed the triage vital signs and the nursing notes.  Pertinent labs & imaging results that were available during my care of the patient were reviewed by me and considered in my medical decision making (see chart for details).    MDM Rules/Calculators/A&P                      69 year old female with past medical history of HFpEF, asthma, HTN, A Fib on Eliquis presenting to the emerge department complaining of progressively worsening shortness of breath over the past several weeks.  Differential diagnoses considered include acute on chronic worsening anemia, decompensated A. fib, electrolyte derangement, less likely PE, pneumonia, ACS, dissection, pericarditis or myocarditis, infectious process  Upon initial assessment  ABCs intact, blood pressure improved from reported prearrival numbers, patient is tachycardic and in A. fib in the low 100s to 120s.  Will hold on any rate control agent at this time.   ECG interpreted by me demonstrated Atrial fibrillation at 106 bpm, normal axis, QTC 449 ms, otherwise normal intervals, T wave inversions in the anterior leads, otherwise no ST or T wave changes suggestive of acute ischemia, overall similar appearing EKG compared to previous on 12/07/2018 as well as 06/02/2019  CXR interpreted by me demonstrated no acute cardiopulmonary processes, stable enlarged cardiac silhouette compared to previous on 06/02/2019  Labs demonstrated leukocytosis of 17.7 though has no localizing symptoms to suggest an acute infection, normocytic anemia with hemoglobin 9.5 and MCV of 90.4 improved compared to previous, no thrombocytopenia or thrombocytosis, mildly elevated creatinine to 1.55 with a BUN of 19 overall similar compared to previous and near baseline though could reflect a mild prerenal azotemia and we have since treated  with IV fluids, mild hypocalcemia 8.3 also appears stable compared to previous otherwise no sign of arrangement on BMP, magnesium 1.7, coagulation studies demonstrate a PT of 18.1 and INR 1.5  Upon reassessment patient was able to ambulate in the emergency department without assistance under the supervision of the nurse using her cane without oxygen desaturations or significant changes in her heart rate or reproduction of worsening shortness of breath or other symptoms.  Discussed the case with cardiology in regard to the patient's rate control agent. Patient has not been consistently taking her metoprolol with concerns that it drops her blood pressure.  Patient is tachycardic in the low 100s and borderline hypotensive, but at her baseline at approximately [id number] systolic.  Asked cardiology if they will would want an alternative rate control agent for the patient's atrial fibrillation.  Cardiology recommended that the patient take 25 mg extended release metoprolol tablet at night until follow up with the A Fib clinic. They will arrange close follow up in the A Fib clinic.  I discussed this plan with the patient who expressed her concerns in regard to discharge as she has been feeling poorly for the past month.  The patient felt that she needed to be admitted for monitoring of her A. fib.  Throughout the patient's stay in the emergency department her heart rate has remained stable in the 80s to low 100s rarely if at all going over 110 bpm.  The patient's heart rate despite being noncompliant with her metoprolol has been at goal in terms of being less than 110 bpm consistently.  Further the patient's blood pressure is remained stable at her baseline in the 0000000 to [id number] systolic.  Further the patient has had additional stable vital signs while in the emergency department.  I explained our evaluation and findings to the patient and that I felt that there was no acute indication for admission at this time.  The  patient is safe and stable for discharge at this time with return precautions provided and a plan for follow up care in place as needed  The plan for this patient was discussed with Dr. Gilford Raid, who voiced agreement and who oversaw evaluation and treatment of this patient.  Final Clinical Impression(s) / ED Diagnoses Final diagnoses:  Palpitations  SOB (shortness of breath)    Rx / DC Orders ED Discharge Orders    None       Filbert Berthold, MD 06/05/19 2209    Isla Pence, MD 06/05/19 2329

## 2019-06-05 NOTE — ED Triage Notes (Signed)
Pt to ED via [facility] with c/o shortness of breath and rapid heart rate off and on x's 4 weeks.  Pt st's she had knee surg on March 9 and symptoms started after that.  Pt was at [facility] on 4/12 for same but left before being seen.  Pt denies any chest pain

## 2019-06-05 NOTE — Discharge Instructions (Addendum)
Please take 25 mg of your extended release metoprolol prior to bed.  If able consider taking it twice daily as recommended.  Generally metoprolol will not lower your blood pressure significantly as it has more effect on your heart rate instead of your blood pressure

## 2019-06-06 LAB — SARS CORONAVIRUS 2 (TAT 6-24 HRS): SARS Coronavirus 2: NEGATIVE

## 2019-06-10 ENCOUNTER — Encounter (HOSPITAL_COMMUNITY): Payer: Self-pay

## 2019-06-10 ENCOUNTER — Ambulatory Visit (HOSPITAL_COMMUNITY): Payer: Medicare Other | Admitting: Physician Assistant

## 2019-06-11 DIAGNOSIS — Z96651 Presence of right artificial knee joint: Secondary | ICD-10-CM | POA: Diagnosis not present

## 2019-06-11 DIAGNOSIS — Z471 Aftercare following joint replacement surgery: Secondary | ICD-10-CM | POA: Diagnosis not present

## 2019-06-12 ENCOUNTER — Telehealth: Payer: Self-pay | Admitting: Internal Medicine

## 2019-06-12 ENCOUNTER — Other Ambulatory Visit: Payer: Self-pay

## 2019-06-12 ENCOUNTER — Other Ambulatory Visit: Payer: Self-pay | Admitting: Internal Medicine

## 2019-06-12 ENCOUNTER — Ambulatory Visit (INDEPENDENT_AMBULATORY_CARE_PROVIDER_SITE_OTHER): Payer: Medicare Other | Admitting: Adult Health

## 2019-06-12 ENCOUNTER — Encounter: Payer: Self-pay | Admitting: Adult Health

## 2019-06-12 DIAGNOSIS — D72829 Elevated white blood cell count, unspecified: Secondary | ICD-10-CM

## 2019-06-12 DIAGNOSIS — Z7901 Long term (current) use of anticoagulants: Secondary | ICD-10-CM | POA: Diagnosis not present

## 2019-06-12 DIAGNOSIS — N189 Chronic kidney disease, unspecified: Secondary | ICD-10-CM | POA: Diagnosis not present

## 2019-06-12 DIAGNOSIS — I509 Heart failure, unspecified: Secondary | ICD-10-CM | POA: Diagnosis not present

## 2019-06-12 DIAGNOSIS — I4891 Unspecified atrial fibrillation: Secondary | ICD-10-CM

## 2019-06-12 DIAGNOSIS — R3915 Urgency of urination: Secondary | ICD-10-CM

## 2019-06-12 DIAGNOSIS — E039 Hypothyroidism, unspecified: Secondary | ICD-10-CM

## 2019-06-12 DIAGNOSIS — J4540 Moderate persistent asthma, uncomplicated: Secondary | ICD-10-CM

## 2019-06-12 DIAGNOSIS — R0600 Dyspnea, unspecified: Secondary | ICD-10-CM | POA: Diagnosis not present

## 2019-06-12 DIAGNOSIS — R0609 Other forms of dyspnea: Secondary | ICD-10-CM

## 2019-06-12 NOTE — Telephone Encounter (Signed)
Patient c/o Palpitations:  High priority if patient c/o lightheadedness, shortness of breath, or chest pain  1) How long have you had palpitations/irregular HR/ Afib? Are you having the symptoms now? Afib, about 3 weeks if not longer.   2) Are you currently experiencing lightheadedness, SOB or CP? SOB and lightheadedness  3) Do you have a history of afib (atrial fibrillation) or irregular heart rhythm? Yes of afib  4) Have you checked your BP or HR? (document readings if available): HR 166 but started slowing down to 104. Unsure of BP  5) Are you experiencing any other symptoms? Appetite is gone - 1 or 2 bites is about all she can eat.

## 2019-06-12 NOTE — Telephone Encounter (Signed)
Pt called to report that her AFIB has been worsening... she can feel her heart beating irregular and he rate has been up and down.  She says recently it was int he 160's but came down to 104 with rest.   Pt says she has been having increased SOB with minimal exertion. She saw pulmonary today and they suggested she get back in with Cardio.   I made the pt an appt with Almyra Deforest PA for 06/13/19.

## 2019-06-12 NOTE — Progress Notes (Signed)
Virtual Visit via Telephone Note  I connected with Jillian Eaton on 06/12/19 at 11:00 AM EDT by telephone and verified that I am speaking with the correct person using two identifiers.  Location: Patient: Home  Provider: Home    I discussed the limitations, risks, security and privacy concerns of performing an evaluation and management service by telephone and the availability of in person appointments. I also discussed with the patient that there may be a patient responsible charge related to this service. The patient expressed understanding and agreed to proceed.   History of Present Illness: 69 year old female never smoker followed for moderate persistent asthma. Medical history significant for atrial fibrillation, chronic combined CHF  And chronic kidney disease.  Today's televisit is an acute office visit.  Patient complains over the last several weeks that she has had increased shortness of breath.  Breathing seems to be more winded with activities.  She has no associated cough or wheezing.  She says now that her breathing seems to be getting worse even at rest she sometimes feels short of breath.  Her appetite is also decreased.  She did go to the emergency room a few days ago.  Emergency room records were reviewed chest x-ray was without acute process.  Patient was in A. fib which she has a known history.  Her rate was elevated at 111.  Patient says that she has been having more episodes of faster heart rate feeling that her heart is racing and fluttering.  She also has noticed that her heart rate has been going up anywhere 150-160 with walking.  Today on her televisit heart rate was around 119 at rest.  And did go up to 150 with walking.  She denies any orthopnea or increased leg swelling.  Patient is on Eliquis and endorses good compliance.  She denies any known bleeding.  She has chronic anemia and lab work from the emergency room showed stable hemoglobin and hematocrit.   Patient did have the surgery last month.  She says her knee is doing well but she is having a hard time doing physical therapy because of her shortness of breath.  She also has very low activity tolerance.  She is taking some pain medications.  O2 saturations are 98%. Patient's white blood cell count was elevated at around 17,000 Serum creatinine was up slightly at 1.5 Observations/Objective:  PFT 10/19/15 >> FEV1 1.38 (60%), FEV1% 82, TLC 3.34 (60%), DLCO 74%, +BD ABG 07/08/16 >> pH 7.38, PCO2 51.9, PO2 70 PFT 03/11/19 >> FEV1 1.44 (65%), FEV1% 83, TLC 3.97 (72%), DLCO 72%, +BD  CT angio chest 12/06/18 >> enlarged PA, CM, mosaic attenuation with GGO RUL   Assessment and Plan:  Dyspnea questionable etiology recent emergency room visit showed a unremarkable chest x-ray.  She has no associated symptoms for asthma flare such as wheezing and cough. Possibly secondary to her A. fib and possible episodes of RVR Will check labs including TSH and a BNP. Have asked her to limit her pain medications if possible.  Will refer to cardiology for further evaluation of her A. Fib. O2 saturations are adequate at 98% have a low suspicion for pulmonary embolism as she has no leg swelling calf pain.  And is on daily Eliquis with endorsed compliance.  Chronic combined congestive heart failure.  She says her weight is stable and no increased leg swelling.  Have asked her to continue on a low-salt diet her diuretics.  We will check a BNP and be met  however she has chronic kidney disease so we will need to diurese cautiously if needed  Leukocytosis questionable etiology.  Patient's white blood cell count is quite elevated from her baseline.  She has no obvious signs of infection or symptoms.  We will check a urine in case she has a underlying urinary tract infection that could be contributing.  She will need close follow-up if not improving will need to be seen in person this week in the office and or seek emergency  room care  Plan  Patient Instructions  Labs today .  Continue on BREO 1 puff daily .   Refer to cardiology for A Fib and Tachycardia .  Use extreme caution with pain meds.  Follow up with Dr. Halford Chessman  In 2-3 weeks and As needed   Please contact office for sooner follow up if symptoms do not improve or worsen or seek emergency care          Follow Up Instructions:   follow up in 2 weeks and As needed   Please contact office for sooner follow up if symptoms do not improve or worsen or seek emergency care   I discussed the assessment and treatment plan with the patient. The patient was provided an opportunity to ask questions and all were answered. The patient agreed with the plan and demonstrated an understanding of the instructions.   The patient was advised to call back or seek an in-person evaluation if the symptoms worsen or if the condition fails to improve as anticipated.  I provided 30 minutes of non-face-to-face time during this encounter.   Rexene Edison, NP

## 2019-06-12 NOTE — Patient Instructions (Addendum)
Labs today .  Continue on BREO 1 puff daily .   Refer to cardiology for A Fib and Tachycardia .  Use extreme caution with pain meds.  Follow up with Dr. Halford Chessman  In 2-3 weeks and As needed   Please contact office for sooner follow up if symptoms do not improve or worsen or seek emergency care

## 2019-06-13 ENCOUNTER — Ambulatory Visit (INDEPENDENT_AMBULATORY_CARE_PROVIDER_SITE_OTHER): Payer: Medicare Other | Admitting: Physician Assistant

## 2019-06-13 ENCOUNTER — Other Ambulatory Visit (INDEPENDENT_AMBULATORY_CARE_PROVIDER_SITE_OTHER): Payer: Medicare Other

## 2019-06-13 ENCOUNTER — Other Ambulatory Visit: Payer: Self-pay

## 2019-06-13 ENCOUNTER — Encounter: Payer: Self-pay | Admitting: Physician Assistant

## 2019-06-13 VITALS — BP 98/56 | HR 96 | Temp 95.3°F | Ht 67.0 in | Wt 204.8 lb

## 2019-06-13 DIAGNOSIS — R5383 Other fatigue: Secondary | ICD-10-CM | POA: Diagnosis not present

## 2019-06-13 DIAGNOSIS — E039 Hypothyroidism, unspecified: Secondary | ICD-10-CM

## 2019-06-13 DIAGNOSIS — R0609 Other forms of dyspnea: Secondary | ICD-10-CM

## 2019-06-13 DIAGNOSIS — R0600 Dyspnea, unspecified: Secondary | ICD-10-CM

## 2019-06-13 DIAGNOSIS — I4819 Other persistent atrial fibrillation: Secondary | ICD-10-CM | POA: Diagnosis not present

## 2019-06-13 DIAGNOSIS — I428 Other cardiomyopathies: Secondary | ICD-10-CM

## 2019-06-13 LAB — TSH: TSH: 7.71 u[IU]/mL — ABNORMAL HIGH (ref 0.35–4.50)

## 2019-06-13 LAB — BASIC METABOLIC PANEL
BUN: 15 mg/dL (ref 6–23)
CO2: 29 mEq/L (ref 19–32)
Calcium: 8.3 mg/dL — ABNORMAL LOW (ref 8.4–10.5)
Chloride: 99 mEq/L (ref 96–112)
Creatinine, Ser: 1.25 mg/dL — ABNORMAL HIGH (ref 0.40–1.20)
GFR: 51.72 mL/min — ABNORMAL LOW (ref >60.00)
Glucose, Bld: 82 mg/dL (ref 70–99)
Potassium: 4.1 mEq/L (ref 3.5–5.1)
Sodium: 137 mEq/L (ref 135–145)

## 2019-06-13 LAB — CBC
Hematocrit: 36.9 % (ref 34.0–46.6)
Hemoglobin: 11.2 g/dL (ref 11.1–15.9)
MCH: 27.8 pg (ref 26.6–33.0)
MCHC: 30.4 g/dL — ABNORMAL LOW (ref 31.5–35.7)
MCV: 92 fL (ref 79–97)
Platelets: 360 10*3/uL (ref 150–450)
RBC: 4.03 x10E6/uL (ref 3.77–5.28)
RDW: 14.9 % (ref 11.7–15.4)
WBC: 8.9 10*3/uL (ref 3.4–10.8)

## 2019-06-13 LAB — BRAIN NATRIURETIC PEPTIDE: Pro B Natriuretic peptide (BNP): 251 pg/mL — ABNORMAL HIGH (ref 0.0–100.0)

## 2019-06-13 NOTE — Progress Notes (Signed)
Reviewed and agree with assessment/plan.   Jquan Egelston, MD [facility] 02/16/2016, 12:24 PM Pager:  [phone]  

## 2019-06-13 NOTE — Patient Instructions (Signed)
Medication Instructions:  Your physician recommends that you continue on your current medications as directed. Please refer to the Current Medication list given to you today.  *If you need a refill on your cardiac medications before your next appointment, please call your pharmacy*  Lab Work: Your physician recommends that you return for lab work in [place]:   [place] If you have labs (blood work) drawn today and your tests are completely normal, you will receive your results only by: Marland Kitchen MyChart Message (if you have MyChart) OR . A paper copy in the mail If you have any lab test that is abnormal or we need to change your treatment, we will call you to review the results.  Testing/Procedures: Your physician has requested that you have an echocardiogram. Echocardiography is a painless test that uses sound waves to create images of your heart. It provides your doctor with information about the size and shape of your heart and how well your heart's chambers and valves are working. This procedure takes approximately one hour. There are no restrictions for this procedure.   Please schedule for 1 week   Follow-Up: At [facility], you and your health needs are our priority.  As part of our continuing mission to provide you with exceptional heart care, we have created designated Provider Care Teams.  These Care Teams include your primary Cardiologist (physician) and Advanced Practice Providers (APPs -  Physician Assistants and Nurse Practitioners) who all work together to provide you with the care you need, when you need it.  Your next appointment:   1 week(s)  The format for your next appointment:   In Person  Provider:   Almyra Deforest, PA-C  Other Instructions

## 2019-06-13 NOTE — Progress Notes (Addendum)
Cardiology Office Note:    Date:  06/14/2019   ID:  Jillian Eaton, DOB 1950-06-23, MRN [id number]  PCP:  Harvest Forest, MD  Cardiologist:  Chrystie Nose, MD  Electrophysiologist:  None   Referring MD: Harvest Forest, MD   Chief Complaint  Patient presents with  . Follow-up    seen for Dr. Rennis Golden  . Shortness of Breath    History of Present Illness:    Jillian Eaton is a 69 y.o. female with a hx of morbid obesity, fibromyalgia, history of heart murmur, permanent atrial fibrillation and thyroid disease.  Due to increasing shortness of breath, patient underwent cardiac catheterization in 2012 that showed normal coronary arteries, mildly elevated filling pressure.  Echocardiogram in 2012 also demonstrated RV systolic pressure of 49, otherwise normal EF. In January 2018, patient was admitted for unintentional narcotic overdose.  She had unresponsiveness and a possible aspiration.  During the year, she had a repeat echocardiogram that showed EF as low as 25%, this was felt to be nonischemic cardiomyopathy.  She was admitted in July 2018 with hyperkalemia, supplemental potassium and lisinopril were discontinued. Echocardiogram obtained on 07/13/2017 showed EF 60 to 65%, mild to moderate MR, severely dilated right atrium, PA peak pressure 53 mmHg.  Patient was treated at [facility] in [place] in 2019 for atrial fibrillation with RVR.  She was started on Xarelto.  Patient attempted multiple cardioversion in 2019 which were unsuccessful.  A rate control strategy was recommended.  Last echocardiogram obtained on 12/08/2018 was 55 to 60%, severely dilated left atrium, mild to moderate TR.  Patient presents today for cardiology office visit.  She says she has been feeling quite poorly in the past several weeks.  She feels very fatigued, she has some degree of chills as well but no fever.  Her heart rate is only borderline controlled in the 90s.  However at the  moment she move around even a short distance, she noticed her heart rate will jump up into the 140s to 150s.  Systolic blood pressures in the 90s, I do not think I can uptitrate heart rate control therapy any further.  She was seen by pulmonology service yesterday for the same thing and lab order has been done this morning include basic metabolic panel, urinalysis, TSH and BNP.  I also recommend a CBC as her previous white blood cell count was 17.7 a week ago.  Given how poorly she is feeling, I am worried about a potential underlying infection.  I recommend a repeat CBC to follow-up on her white blood cell count.  I do not have a clear diagnosis to explain her overall symptom.  She appears to be euvolemic on physical exam.  I also suspect that her uncontrolled heart rate during atrial fibrillation is a physiological response toward something else.  After she obtain the lab work, I listen to her heart rate again and her heart rate was around 150 bpm just by walking less than 15 feet.  I did refer her to the A. fib clinic to see what other rate control therapy is appropriate in this patient.  However I have a very low threshold to send the patient to the emergency room if her white blood cell count come back elevated.  I discussed that the case with her cardiologist Dr. Rennis Golden who was agreeable as well.  With her history of nonischemic cardiomyopathy, I recommended urgent echocardiogram to reevaluate her ejection fraction.   Past Medical History:  Diagnosis Date  . Anemia    iron and pernicious  . Arthritis   . Asthma    flare up last yr  . CHF (congestive heart failure) (HCC)   . Chronic headache    " better now "   . Complication of anesthesia    BP dropped   . DOE (dyspnea on exertion)    2D ECHO, 02/12/2012 - EF 60-65%, moderate concentric hypertrophy  . Dysrhythmia   . Fibromyalgia    nerve pain"left side at waist level" "can't lay on that side without pain" , "HOB elevation helps"; pt. thinks  this has resolved after 200lb weight loss9-17- since i lost the wieght , i dont  think i have this anymore   . Heart murmur   . Hematuria - cause not known    resolved   . History of kidney stones    x 2 '13, '14 surgery to remove  . Hypertension   . Pneumonia    aspiration  04/2016  . SBO (small bowel obstruction) (HCC) 06/07/2013  . SBO (small bowel obstruction) (HCC)    rqueired admission 2020  . Thyroid disease    "goiter"  . Transfusion history    10 yrs+    Past Surgical History:  Procedure Laterality Date  . ABDOMINAL ADHESION SURGERY  2004   open LOA - in [place]  . CARDIAC CATHETERIZATION  04/04/2010   No significant obstructive coronary artery disease  . CARDIOVERSION N/A 08/08/2017   Procedure: CARDIOVERSION;  Surgeon: Chrystie Nose, MD;  Location: [facility] ENDOSCOPY;  Service: Cardiovascular;  Laterality: N/A;  . CHOLECYSTECTOMY  1990  . COLONOSCOPY W/ POLYPECTOMY    . COLONOSCOPY WITH PROPOFOL N/A 04/10/2014   Procedure: COLONOSCOPY WITH PROPOFOL;  Surgeon: Theda Belfast, MD;  Location: [facility] ENDOSCOPY;  Service: Endoscopy;  Laterality: N/A;  . EYE SURGERY     lasik 20-25 yrs ago  . GASTROPLASTY VERTICAL BANDED  1985  . LAPAROSCOPIC LYSIS INTESTINAL ADHESIONS  1995   In [place] for SBO  . REVERSE SHOULDER ARTHROPLASTY Right 03/29/2018   Procedure: REVERSE SHOULDER ARTHROPLASTY;  Surgeon: Beverely Low, MD;  Location[facility] OR;  Service: Orthopedics;  Laterality: Right;  . ROUX-EN-Y GASTRIC BYPASS  1992   Conversion VBG to RnYGB in [place], [place]  . SHOULDER ARTHROSCOPY WITH SUBACROMIAL DECOMPRESSION  2016   Dr Luiz Blare, [place], [place]  . SMALL INTESTINE SURGERY  2000   SBO - LOA w SB resection  . TOTAL KNEE ARTHROPLASTY Left 11/14/2018   Procedure: TOTAL KNEE ARTHROPLASTY;  Surgeon: Durene Romans, MD;  Location: [facility] ORS;  Service: Orthopedics;  Laterality: Left;  70 mins  . TOTAL KNEE ARTHROPLASTY Right 04/29/2019   Procedure: TOTAL KNEE ARTHROPLASTY;  Surgeon: Durene Romans,  MD;  Location: [facility] ORS;  Service: Orthopedics;  Laterality: Right;  70 mins  . TOTAL THYROIDECTOMY  06/26/2012   Dr Lady Gary, [facility]  . TUBAL LIGATION  1986  . URETHRAL DILATION  2012   w cystoscopy.  Dr Patsi Sears  . UTERINE FIBROID EMBOLIZATION      Current Medications: Current Meds  Medication Sig  . acetaminophen (TYLENOL) 500 MG tablet Take 2 tablets (1,000 mg total) by mouth every 8 (eight) hours. (Patient taking differently: Take 1,000 mg by mouth every 6 (six) hours as needed for moderate pain. )  . calcitRIOL (ROCALTROL) 0.5 MCG capsule Take 0.5 mcg by mouth in the morning and at bedtime.   . clotrimazole (LOTRIMIN) 1 % cream APPLY TO AFFECTED AREA  TWICE A DAY (Patient taking differently: Apply 1 application topically daily as needed (itching). )  . cyanocobalamin (,VITAMIN B-12,) 1000 MCG/ML injection Inject 1 mL (1,000 mcg total) into the muscle every 30 (thirty) days.  . cyclobenzaprine (FLEXERIL) 10 MG tablet Take 2 tablets (20 mg total) by mouth 2 (two) times daily. (Patient taking differently: Take 10 mg by mouth daily. )  . DULoxetine (CYMBALTA) 60 MG capsule Take 1 capsule (60 mg total) by mouth daily.  Marland Kitchen ELIQUIS 5 MG TABS tablet TAKE 1 TABLET TWICE A DAY (Patient taking differently: Take 5 mg by mouth 2 (two) times daily. )  . eszopiclone (LUNESTA) 2 MG TABS tablet Take 1 tablet (2 mg total) by mouth at bedtime.  . ferrous sulfate (FERROUSUL) 325 (65 FE) MG tablet Take 1 tablet (325 mg total) by mouth 3 (three) times daily with meals for 14 days. (Patient taking differently: Take 325 mg by mouth 2 (two) times daily with a meal. )  . fluticasone furoate-vilanterol (BREO ELLIPTA) 100-25 MCG/INH AEPB Inhale 1 puff into the lungs daily.  . furosemide (LASIX) 20 MG tablet Take 1 tablet (20 mg total) by mouth as needed.  . levETIRAcetam (KEPPRA) 500 MG tablet Take 1 tablet (500 mg total) by mouth 2 (two) times daily. (Patient taking differently: Take 500 mg by mouth daily. )    . levothyroxine (SYNTHROID) 150 MCG tablet TAKE 1 TABLET BY MOUTH EVERY DAY (Patient taking differently: Take 150 mcg by mouth daily before breakfast. )  . magnesium oxide (MAG-OX) 400 (241.3 Mg) MG tablet Take 1 tablet by mouth daily.  . metoprolol succinate (TOPROL-XL) 25 MG 24 hr tablet Take 25 mg by mouth 2 (two) times daily.  . NONFORMULARY OR COMPOUNDED ITEM [facility]:  Antifungal Cream - Terbinafine 3%, Fluconazole 2%, Tea Tree Oil 5%, Urea 10%, Ibuprofen 2% in DMSO Suspension #54ml. Apply to affected toenail(s) once daily (at bedtime) or twice daily. (Patient taking differently: Apply 1 application topically at bedtime. [facility]:  Antifungal Cream - Terbinafine 3%, Fluconazole 2%, Tea Tree Oil 5%, Urea 10%, Ibuprofen 2% in DMSO Suspension #14ml. Apply to affected toenail(s) once daily (at bedtime) or twice daily.)  . traZODone (DESYREL) 50 MG tablet Take 1 tablet (50 mg total) by mouth at bedtime.  . Vitamin D, Ergocalciferol, (DRISDOL) 1.25 MG (50000 UT) CAPS capsule Take 1 capsule (50,000 Units total) by mouth every Wednesday.     Allergies:   Hydrocodone   Social History   Socioeconomic History  . Marital status: Married    Spouse name: Not on file  . Number of children: 3  . Years of education: Not on file  . Highest education level: Master's degree (e.g., MA, MS, Auryn Paige, MEd, MSW, MBA)  Occupational History  . Occupation: Self employed Psychologist, sport and exercise  Tobacco Use  . Smoking status: Never Smoker  . Smokeless tobacco: Never Used  Substance and Sexual Activity  . Alcohol use: No    Comment: wine occ  . Drug use: No    Types: Oxycodone    Comment: perscribed  . Sexual activity: Not Currently    Birth control/protection: None  Other Topics Concern  . Not on file  Social History Narrative   Right handed   1 cup of caffeine per day        Social Determinants of Health   Financial Resource Strain:   . Difficulty of Paying Living Expenses:   Food  Insecurity:   . Worried About [employer] of  Food in the Last Year:   . Ran Out of Food in the Last Year:   Transportation Needs:   . Freight forwarder (Medical):   Marland Kitchen Lack of Transportation (Non-Medical):   Physical Activity:   . Days of Exercise per Week:   . Minutes of Exercise per Session:   Stress:   . Feeling of Stress :   Social Connections:   . Frequency of Communication with Friends and Family:   . Frequency of Social Gatherings with Friends and Family:   . Attends Religious Services:   . Active Member of Clubs or Organizations:   . Attends Banker Meetings:   Marland Kitchen Marital Status:      Family History: The patient's family history includes Asthma in her father; Breast cancer in her maternal grandmother, mother, and paternal grandmother; Cancer in her maternal grandmother, mother, and paternal grandmother; Diabetes in her father and mother; Epilepsy in her mother and sister; Heart disease in her father; Hypertension in her father and mother; Kidney disease in her father; Seizures in her mother.  ROS:   Please see the history of present illness.     All other systems reviewed and are negative.  EKGs/Labs/Other Studies Reviewed:    The following studies were reviewed today:  Echo 12/08/2018 1. Left ventricular ejection fraction, by visual estimation, is 55 to  60%. The left ventricle has normal function. Normal left ventricular size.  There is no left ventricular hypertrophy.  2. Left ventricular diastolic function could not be evaluated pattern of  LV diastolic filling.  3. Global right ventricle has normal systolic function.The right  ventricular size is mildly enlarged. No increase in right ventricular wall  thickness.  4. Left atrial size was severely dilated.  5. Right atrial size was severely dilated.  6. The mitral valve is normal in structure. Trace mitral valve  regurgitation.  7. The tricuspid valve is normal in structure. Tricuspid valve   regurgitation mild-moderate.  8. The aortic valve is normal in structure. Aortic valve regurgitation is  trivial by color flow Doppler.  9. The pulmonic valve was grossly normal. Pulmonic valve regurgitation is  not visualized by color flow Doppler.  10. Normal pulmonary artery systolic pressure.  11. The inferior vena cava is normal in size with greater than 50%  respiratory variability, suggesting right atrial pressure of 3 mmHg.  EKG:  EKG is not ordered today.   Recent Labs: 12/06/2018: B Natriuretic Peptide 182.3 05/15/2019: ALT 9 06/05/2019: Magnesium 1.7 06/13/2019: BUN 15; Creatinine, Ser 1.25; Hemoglobin 11.2; Platelets 360; Potassium 4.1; Pro B Natriuretic peptide (BNP) 251.0; Sodium 137; TSH 7.71  Recent Lipid Panel    Component Value Date/Time   CHOL 150 05/27/2018 0543   TRIG 50 05/27/2018 0543   HDL 62 05/27/2018 0543   CHOLHDL 2.4 05/27/2018 0543   VLDL 10 05/27/2018 0543   LDLCALC 78 05/27/2018 0543    Physical Exam:    VS:  BP (!) 98/56   Pulse 96   Temp (!) 95.3 F (35.2 C) Comment: Forehead  Ht 5\' 7"  (1.702 m)   Wt 204 lb 12.8 oz (92.9 kg)   SpO2 97%   BMI 32.08 kg/m     Wt Readings from Last 3 Encounters:  06/13/19 204 lb 12.8 oz (92.9 kg)  06/05/19 203 lb (92.1 kg)  05/15/19 217 lb 6.4 oz (98.6 kg)     GEN: Frail, ill-appearing. HEENT: Normal NECK: No JVD; No carotid bruits LYMPHATICS: No lymphadenopathy CARDIAC: Irregularly  irregular., no murmurs, rubs, gallops RESPIRATORY:  Clear to auscultation without rales, wheezing or rhonchi  ABDOMEN: Soft, non-tender, non-distended MUSCULOSKELETAL:  No edema; No deformity  SKIN: Warm and dry NEUROLOGIC:  Alert and oriented x 3 PSYCHIATRIC:  Normal affect   ASSESSMENT:    1. Fatigue, unspecified type   2. Dyspnea, unspecified type   3. Persistent atrial fibrillation (HCC)   4. Hypothyroidism, unspecified type   5. Morbid obesity (HCC)    PLAN:    In order of problems listed  above:  1. Fatigue and dyspnea: This has been going on for several weeks, she does not appears to be volume overloaded.  She has been taking Lasix almost on a daily basis recently due to shortness of breath.  However I suspect her shortness of breath is related to her uncontrolled heart rate.  After walking only a short distance, her heart rate went up to 150s.  Moreover, I am not entirely sure if her heart rate is the primary cause of her symptoms, I suspect her heart rate's significant increase with any movement is a physiological response toward something else.  I want to get a echocardiogram to make sure she does not have a significant pericardial effusion or any drop in the ejection fraction.  She went to the ED a week ago and had a white blood cell count of 17.7, even though she is on Breo however she does not take any systemic steroid therapy.  I will repeat a CBC.  She was seen by pulmonology service yesterday who ordered basic metabolic panel, TSH, BNP.  She does not appears to be volume overloaded on physical exam.  Overall, I am quite concerned about this patient, she has had drastic weight loss of over 20 pounds in the past several months and that this is unintentional weight loss.  She has completely lost her appetite.  If her white blood cell count is still elevated, I would have low threshold to send her to the emergency room.  She is aware that if she continued to feel poorly, she should go back to the ED as well.  Recent COVID-19 test last week was normal.  However her overall symptom make me concerned of sepsis as well.  We'll wait for the white blood cell count to come back to make further decision.  2. Persistent atrial fibrillation: Heart rate poorly controlled.  Blood pressure does not allow further up titration of rate control agent.  Instead of Toprol-XL 25 mg twice daily, she is actually only taking it daily.  I do not think she can tolerate twice daily dosing at this time.  Baseline  heart rate is in the high 90s, however at the moment she moves around, her heart rate jumps up to 140s to 150s range.  Her heart rate is clearly uncontrolled, however I suspect there is something else that is driving her heart rate.  I feel like her heart rate response to with activity is very drastic and the likely is a physiological response toward something else.  3. Nonischemic cardiomyopathy: She appears to be euvolemic on physical exam.  She had a prior history of EF 25% in 2018, since then ejection fraction has normalized.  With her recent symptoms, I am concerned of either a drop in the ejection fraction or pericardial effusion, I recommended urgent echocardiogram as soon as possible  4. Hypothyroidism: TSH obtained by pulmonology service this morning.  She had a history of low TSH where her thyroid medication  was decreased.  5. Morbid obesity: She has had extreme weight loss recently.  She used to weigh more than 400 pounds.  In the past several months along, she has lost more than 20 pounds.  Patient says she is not trying to lose weight and has essentially lost all appetite.  I am quite concerned about this drastic weight loss and her overall symptom at this point.  I will see her back in 1 week.    Medication Adjustments/Labs and Tests Ordered: Current medicines are reviewed at length with the patient today.  Concerns regarding medicines are outlined above.  Orders Placed This Encounter  Procedures  . CBC  . Amb Referral to AFIB Clinic  . ECHOCARDIOGRAM COMPLETE   No orders of the defined types were placed in this encounter.   Patient Instructions  Medication Instructions:  Your physician recommends that you continue on your current medications as directed. Please refer to the Current Medication list given to you today.  *If you need a refill on your cardiac medications before your next appointment, please call your pharmacy*  Lab Work: Your physician recommends that you return  for lab work in TODAY:   CBC If you have labs (blood work) drawn today and your tests are completely normal, you will receive your results only by: Marland Kitchen MyChart Message (if you have MyChart) OR . A paper copy in the mail If you have any lab test that is abnormal or we need to change your treatment, we will call you to review the results.  Testing/Procedures: Your physician has requested that you have an echocardiogram. Echocardiography is a painless test that uses sound waves to create images of your heart. It provides your doctor with information about the size and shape of your heart and how well your heart's chambers and valves are working. This procedure takes approximately one hour. There are no restrictions for this procedure.   Please schedule for 1 week   Follow-Up: At [facility], [place], you and your health needs are our priority.  As part of our continuing mission to provide you with exceptional heart care, we have created designated Provider Care Teams.  These Care Teams include your primary Cardiologist (physician) and Advanced Practice Providers (APPs -  Physician Assistants and Nurse Practitioners) who all work together to provide you with the care you need, when you need it.  Your next appointment:   1 week(s)  The format for your next appointment:   In Person  Provider:   Azalee Course, PA-C  Other Instructions      Signed, Azalee Course, [place]  06/14/2019 11:48 AM    [facility]

## 2019-06-14 ENCOUNTER — Emergency Department (HOSPITAL_COMMUNITY): Payer: Medicare Other

## 2019-06-14 ENCOUNTER — Inpatient Hospital Stay (HOSPITAL_COMMUNITY)
Admission: EM | Admit: 2019-06-14 | Discharge: 2019-06-21 | DRG: 243 | Disposition: A | Discharge status: Home / Self Care | Payer: Medicare Other | Attending: Internal Medicine | Admitting: Internal Medicine

## 2019-06-14 ENCOUNTER — Encounter (HOSPITAL_COMMUNITY): Payer: Self-pay | Admitting: Emergency Medicine

## 2019-06-14 ENCOUNTER — Encounter: Payer: Self-pay | Admitting: Physician Assistant

## 2019-06-14 ENCOUNTER — Observation Stay (HOSPITAL_COMMUNITY): Payer: Medicare Other

## 2019-06-14 DIAGNOSIS — D631 Anemia in chronic kidney disease: Secondary | ICD-10-CM | POA: Diagnosis present

## 2019-06-14 DIAGNOSIS — I4891 Unspecified atrial fibrillation: Secondary | ICD-10-CM

## 2019-06-14 DIAGNOSIS — I5042 Chronic combined systolic (congestive) and diastolic (congestive) heart failure: Secondary | ICD-10-CM | POA: Diagnosis not present

## 2019-06-14 DIAGNOSIS — K219 Gastro-esophageal reflux disease without esophagitis: Secondary | ICD-10-CM | POA: Diagnosis present

## 2019-06-14 DIAGNOSIS — Z8249 Family history of ischemic heart disease and other diseases of the circulatory system: Secondary | ICD-10-CM

## 2019-06-14 DIAGNOSIS — Z6831 Body mass index (BMI) 31.0-31.9, adult: Secondary | ICD-10-CM

## 2019-06-14 DIAGNOSIS — R05 Cough: Secondary | ICD-10-CM | POA: Diagnosis not present

## 2019-06-14 DIAGNOSIS — R627 Adult failure to thrive: Secondary | ICD-10-CM | POA: Diagnosis present

## 2019-06-14 DIAGNOSIS — E039 Hypothyroidism, unspecified: Secondary | ICD-10-CM

## 2019-06-14 DIAGNOSIS — Z96653 Presence of artificial knee joint, bilateral: Secondary | ICD-10-CM | POA: Diagnosis present

## 2019-06-14 DIAGNOSIS — E669 Obesity, unspecified: Secondary | ICD-10-CM | POA: Diagnosis not present

## 2019-06-14 DIAGNOSIS — I482 Chronic atrial fibrillation, unspecified: Secondary | ICD-10-CM

## 2019-06-14 DIAGNOSIS — I5032 Chronic diastolic (congestive) heart failure: Secondary | ICD-10-CM | POA: Diagnosis not present

## 2019-06-14 DIAGNOSIS — N200 Calculus of kidney: Secondary | ICD-10-CM | POA: Diagnosis not present

## 2019-06-14 DIAGNOSIS — Z20822 Contact with and (suspected) exposure to covid-19: Secondary | ICD-10-CM | POA: Diagnosis not present

## 2019-06-14 DIAGNOSIS — I2721 Secondary pulmonary arterial hypertension: Secondary | ICD-10-CM | POA: Diagnosis present

## 2019-06-14 DIAGNOSIS — J449 Chronic obstructive pulmonary disease, unspecified: Secondary | ICD-10-CM | POA: Diagnosis present

## 2019-06-14 DIAGNOSIS — I130 Hypertensive heart and chronic kidney disease with heart failure and stage 1 through stage 4 chronic kidney disease, or unspecified chronic kidney disease: Secondary | ICD-10-CM | POA: Diagnosis present

## 2019-06-14 DIAGNOSIS — R569 Unspecified convulsions: Secondary | ICD-10-CM | POA: Diagnosis not present

## 2019-06-14 DIAGNOSIS — R1110 Vomiting, unspecified: Secondary | ICD-10-CM | POA: Diagnosis not present

## 2019-06-14 DIAGNOSIS — R0602 Shortness of breath: Secondary | ICD-10-CM | POA: Diagnosis not present

## 2019-06-14 DIAGNOSIS — E890 Postprocedural hypothyroidism: Secondary | ICD-10-CM | POA: Diagnosis present

## 2019-06-14 DIAGNOSIS — R5383 Other fatigue: Secondary | ICD-10-CM | POA: Diagnosis not present

## 2019-06-14 DIAGNOSIS — G894 Chronic pain syndrome: Secondary | ICD-10-CM | POA: Diagnosis not present

## 2019-06-14 DIAGNOSIS — N132 Hydronephrosis with renal and ureteral calculous obstruction: Secondary | ICD-10-CM | POA: Diagnosis not present

## 2019-06-14 DIAGNOSIS — F329 Major depressive disorder, single episode, unspecified: Secondary | ICD-10-CM | POA: Diagnosis not present

## 2019-06-14 DIAGNOSIS — Z9981 Dependence on supplemental oxygen: Secondary | ICD-10-CM

## 2019-06-14 DIAGNOSIS — Z9884 Bariatric surgery status: Secondary | ICD-10-CM

## 2019-06-14 DIAGNOSIS — Z7901 Long term (current) use of anticoagulants: Secondary | ICD-10-CM | POA: Diagnosis not present

## 2019-06-14 DIAGNOSIS — I10 Essential (primary) hypertension: Secondary | ICD-10-CM | POA: Diagnosis not present

## 2019-06-14 DIAGNOSIS — R000 Tachycardia, unspecified: Secondary | ICD-10-CM | POA: Diagnosis not present

## 2019-06-14 DIAGNOSIS — I4819 Other persistent atrial fibrillation: Principal | ICD-10-CM | POA: Diagnosis present

## 2019-06-14 DIAGNOSIS — F32A Depression, unspecified: Secondary | ICD-10-CM | POA: Diagnosis present

## 2019-06-14 DIAGNOSIS — N1831 Chronic kidney disease, stage 3a: Secondary | ICD-10-CM | POA: Diagnosis present

## 2019-06-14 DIAGNOSIS — Z79899 Other long term (current) drug therapy: Secondary | ICD-10-CM

## 2019-06-14 DIAGNOSIS — Z7951 Long term (current) use of inhaled steroids: Secondary | ICD-10-CM

## 2019-06-14 DIAGNOSIS — D509 Iron deficiency anemia, unspecified: Secondary | ICD-10-CM | POA: Diagnosis present

## 2019-06-14 DIAGNOSIS — K449 Diaphragmatic hernia without obstruction or gangrene: Secondary | ICD-10-CM | POA: Diagnosis not present

## 2019-06-14 DIAGNOSIS — Z9049 Acquired absence of other specified parts of digestive tract: Secondary | ICD-10-CM

## 2019-06-14 DIAGNOSIS — Z950 Presence of cardiac pacemaker: Secondary | ICD-10-CM

## 2019-06-14 DIAGNOSIS — G40909 Epilepsy, unspecified, not intractable, without status epilepticus: Secondary | ICD-10-CM

## 2019-06-14 DIAGNOSIS — M797 Fibromyalgia: Secondary | ICD-10-CM | POA: Diagnosis present

## 2019-06-14 DIAGNOSIS — Z79890 Hormone replacement therapy (postmenopausal): Secondary | ICD-10-CM

## 2019-06-14 DIAGNOSIS — J4540 Moderate persistent asthma, uncomplicated: Secondary | ICD-10-CM | POA: Diagnosis present

## 2019-06-14 DIAGNOSIS — Z96651 Presence of right artificial knee joint: Secondary | ICD-10-CM

## 2019-06-14 DIAGNOSIS — Z96611 Presence of right artificial shoulder joint: Secondary | ICD-10-CM | POA: Diagnosis present

## 2019-06-14 DIAGNOSIS — I429 Cardiomyopathy, unspecified: Secondary | ICD-10-CM | POA: Diagnosis present

## 2019-06-14 LAB — CBC WITH DIFFERENTIAL/PLATELET
Abs Immature Granulocytes: 0.03 10*3/uL (ref 0.00–0.07)
Basophils Absolute: 0.1 10*3/uL (ref 0.0–0.1)
Basophils Relative: 1 %
Eosinophils Absolute: 0 10*3/uL (ref 0.0–0.5)
Eosinophils Relative: 0 %
HCT: 32 % — ABNORMAL LOW (ref 36.0–46.0)
Hemoglobin: 9.7 g/dL — ABNORMAL LOW (ref 12.0–15.0)
Immature Granulocytes: 0 %
Lymphocytes Relative: 12 %
Lymphs Abs: 0.9 10*3/uL (ref 0.7–4.0)
MCH: 27.6 pg (ref 26.0–34.0)
MCHC: 30.3 g/dL (ref 30.0–36.0)
MCV: 91.2 fL (ref 80.0–100.0)
Monocytes Absolute: 0.6 10*3/uL (ref 0.1–1.0)
Monocytes Relative: 9 %
Neutro Abs: 5.4 10*3/uL (ref 1.7–7.7)
Neutrophils Relative %: 78 %
Platelets: 320 10*3/uL (ref 150–400)
RBC: 3.51 MIL/uL — ABNORMAL LOW (ref 3.87–5.11)
RDW: 15.9 % — ABNORMAL HIGH (ref 11.5–15.5)
WBC: 7 10*3/uL (ref 4.0–10.5)
nRBC: 0 % (ref 0.0–0.2)

## 2019-06-14 LAB — BASIC METABOLIC PANEL
Anion gap: 12 (ref 5–15)
BUN: 17 mg/dL (ref 8–23)
CO2: 25 mmol/L (ref 22–32)
Calcium: 8.3 mg/dL — ABNORMAL LOW (ref 8.9–10.3)
Chloride: 101 mmol/L (ref 98–111)
Creatinine, Ser: 1.23 mg/dL — ABNORMAL HIGH (ref 0.44–1.00)
GFR calc Af Amer: 53 mL/min — ABNORMAL LOW (ref >60)
GFR calc non Af Amer: 45 mL/min — ABNORMAL LOW (ref >60)
Glucose, Bld: 82 mg/dL (ref 70–99)
Potassium: 4.3 mmol/L (ref 3.5–5.1)
Sodium: 138 mmol/L (ref 135–145)

## 2019-06-14 LAB — BRAIN NATRIURETIC PEPTIDE: B Natriuretic Peptide: 141 pg/mL — ABNORMAL HIGH (ref 0.0–100.0)

## 2019-06-14 LAB — PROTIME-INR
INR: 1.6 — ABNORMAL HIGH (ref 0.8–1.2)
Prothrombin Time: 19.3 seconds — ABNORMAL HIGH (ref 11.4–15.2)

## 2019-06-14 LAB — C-REACTIVE PROTEIN: CRP: 5.7 mg/dL — ABNORMAL HIGH (ref <1.0)

## 2019-06-14 LAB — SEDIMENTATION RATE: Sed Rate: 25 mm/hr — ABNORMAL HIGH (ref 0–22)

## 2019-06-14 LAB — TROPONIN I (HIGH SENSITIVITY)
Troponin I (High Sensitivity): 3 ng/L (ref <18)
Troponin I (High Sensitivity): <2 ng/L (ref <18)

## 2019-06-14 LAB — RESPIRATORY PANEL BY RT PCR (FLU A&B, COVID)
Influenza A by PCR: NEGATIVE
Influenza B by PCR: NEGATIVE
SARS Coronavirus 2 by RT PCR: NEGATIVE

## 2019-06-14 MED ORDER — APIXABAN 5 MG PO TABS
5.0000 mg | ORAL_TABLET | Freq: Two times a day (BID) | ORAL | Status: DC
  Administered 2019-06-14 – 2019-06-19 (×10): 5 mg via ORAL
  Filled 2019-06-14: qty 1
  Filled 2019-06-14: qty 2
  Filled 2019-06-14 (×11): qty 1

## 2019-06-14 MED ORDER — FLUTICASONE FUROATE-VILANTEROL 100-25 MCG/INH IN AEPB
1.0000 | INHALATION_SPRAY | Freq: Every day | RESPIRATORY_TRACT | Status: DC
  Administered 2019-06-15 – 2019-06-21 (×6): 1 via RESPIRATORY_TRACT
  Filled 2019-06-14: qty 28

## 2019-06-14 MED ORDER — ZOLPIDEM TARTRATE 5 MG PO TABS
5.0000 mg | ORAL_TABLET | Freq: Every day | ORAL | Status: DC
  Administered 2019-06-14 – 2019-06-20 (×7): 5 mg via ORAL
  Filled 2019-06-14 (×8): qty 1

## 2019-06-14 MED ORDER — LEVOTHYROXINE SODIUM 75 MCG PO TABS
150.0000 ug | ORAL_TABLET | Freq: Every day | ORAL | Status: DC
  Administered 2019-06-15 – 2019-06-21 (×7): 150 ug via ORAL
  Filled 2019-06-14 (×7): qty 2

## 2019-06-14 MED ORDER — ONDANSETRON HCL 4 MG/2ML IJ SOLN
4.0000 mg | Freq: Four times a day (QID) | INTRAMUSCULAR | Status: DC | PRN
  Administered 2019-06-20: 4 mg via INTRAVENOUS
  Filled 2019-06-14: qty 2

## 2019-06-14 MED ORDER — CALCITRIOL 0.5 MCG PO CAPS
0.5000 ug | ORAL_CAPSULE | Freq: Two times a day (BID) | ORAL | Status: DC
  Administered 2019-06-14 – 2019-06-21 (×13): 0.5 ug via ORAL
  Filled 2019-06-14 (×17): qty 1

## 2019-06-14 MED ORDER — ALBUTEROL SULFATE (2.5 MG/3ML) 0.083% IN NEBU: 2.5000 mg | INHALATION_SOLUTION | RESPIRATORY_TRACT | Status: DC | PRN

## 2019-06-14 MED ORDER — HYDRALAZINE HCL 20 MG/ML IJ SOLN: 5.0000 mg | INTRAMUSCULAR | Status: DC | PRN

## 2019-06-14 MED ORDER — SODIUM CHLORIDE 0.9% FLUSH
3.0000 mL | Freq: Two times a day (BID) | INTRAVENOUS | Status: DC
  Administered 2019-06-15 – 2019-06-21 (×9): 3 mL via INTRAVENOUS

## 2019-06-14 MED ORDER — CYCLOBENZAPRINE HCL 5 MG PO TABS
5.0000 mg | ORAL_TABLET | Freq: Three times a day (TID) | ORAL | Status: DC | PRN
  Administered 2019-06-14 – 2019-06-20 (×8): 5 mg via ORAL
  Filled 2019-06-14 (×8): qty 1

## 2019-06-14 MED ORDER — TRAZODONE HCL 50 MG PO TABS
50.0000 mg | ORAL_TABLET | Freq: Every day | ORAL | Status: DC
  Administered 2019-06-14 – 2019-06-20 (×7): 50 mg via ORAL
  Filled 2019-06-14 (×7): qty 1

## 2019-06-14 MED ORDER — POLYETHYLENE GLYCOL 3350 17 G PO PACK: 17.0000 g | PACK | Freq: Every day | ORAL | Status: DC | PRN

## 2019-06-14 MED ORDER — DULOXETINE HCL 60 MG PO CPEP
60.0000 mg | ORAL_CAPSULE | Freq: Every day | ORAL | Status: DC
  Administered 2019-06-15 – 2019-06-21 (×6): 60 mg via ORAL
  Filled 2019-06-14 (×8): qty 1

## 2019-06-14 MED ORDER — MORPHINE SULFATE (PF) 2 MG/ML IV SOLN
2.0000 mg | INTRAVENOUS | Status: DC | PRN
  Administered 2019-06-20 – 2019-06-21 (×4): 2 mg via INTRAVENOUS
  Filled 2019-06-14 (×4): qty 1

## 2019-06-14 MED ORDER — DIGOXIN 125 MCG PO TABS
0.2500 mg | ORAL_TABLET | ORAL | Status: AC
  Administered 2019-06-14 (×2): 0.25 mg via ORAL
  Filled 2019-06-14 (×2): qty 2

## 2019-06-14 MED ORDER — METOPROLOL SUCCINATE ER 25 MG PO TB24
25.0000 mg | ORAL_TABLET | Freq: Every day | ORAL | Status: DC
  Administered 2019-06-15: 25 mg via ORAL
  Filled 2019-06-14 (×2): qty 1

## 2019-06-14 MED ORDER — IOHEXOL 300 MG/ML  SOLN
100.0000 mL | Freq: Once | INTRAMUSCULAR | Status: AC | PRN
  Administered 2019-06-14: 100 mL via INTRAVENOUS

## 2019-06-14 MED ORDER — ZOLPIDEM TARTRATE 5 MG PO TABS: 5.0000 mg | ORAL_TABLET | Freq: Every evening | ORAL | Status: DC | PRN

## 2019-06-14 MED ORDER — MAGNESIUM OXIDE 400 (241.3 MG) MG PO TABS
400.0000 mg | ORAL_TABLET | Freq: Every day | ORAL | Status: DC
  Administered 2019-06-15 – 2019-06-21 (×6): 400 mg via ORAL
  Filled 2019-06-14 (×7): qty 1

## 2019-06-14 MED ORDER — FERROUS SULFATE 325 (65 FE) MG PO TABS
325.0000 mg | ORAL_TABLET | Freq: Two times a day (BID) | ORAL | Status: DC
  Administered 2019-06-15: 325 mg via ORAL
  Filled 2019-06-14 (×2): qty 1

## 2019-06-14 MED ORDER — ACETAMINOPHEN 650 MG RE SUPP: 650.0000 mg | Freq: Four times a day (QID) | RECTAL | Status: DC | PRN

## 2019-06-14 MED ORDER — LEVETIRACETAM 500 MG PO TABS
500.0000 mg | ORAL_TABLET | Freq: Every day | ORAL | Status: DC
  Administered 2019-06-15 – 2019-06-21 (×6): 500 mg via ORAL
  Filled 2019-06-14 (×7): qty 1

## 2019-06-14 MED ORDER — DIGOXIN 125 MCG PO TABS
0.1250 mg | ORAL_TABLET | Freq: Every day | ORAL | Status: DC
  Administered 2019-06-15 – 2019-06-16 (×2): 0.125 mg via ORAL
  Filled 2019-06-14 (×2): qty 1

## 2019-06-14 MED ORDER — ACETAMINOPHEN 325 MG PO TABS
650.0000 mg | ORAL_TABLET | Freq: Four times a day (QID) | ORAL | Status: DC | PRN
  Administered 2019-06-16: 650 mg via ORAL
  Filled 2019-06-14: qty 2

## 2019-06-14 MED ORDER — ONDANSETRON HCL 4 MG PO TABS: 4.0000 mg | ORAL_TABLET | Freq: Four times a day (QID) | ORAL | Status: DC | PRN

## 2019-06-14 MED ORDER — DOCUSATE SODIUM 100 MG PO CAPS
100.0000 mg | ORAL_CAPSULE | Freq: Two times a day (BID) | ORAL | Status: DC
  Administered 2019-06-15 – 2019-06-21 (×7): 100 mg via ORAL
  Filled 2019-06-14 (×12): qty 1

## 2019-06-14 MED ORDER — BISACODYL 5 MG PO TBEC: 5.0000 mg | DELAYED_RELEASE_TABLET | Freq: Every day | ORAL | Status: DC | PRN

## 2019-06-14 NOTE — Consult Note (Signed)
Cardiology Consultation:   Patient ID: [facility] MRN: [id number]; DOB: 1950/10/09  Admit date: 06/14/2019 Date of Consult: 06/14/2019  Primary Care Provider: [name], MD Primary Cardiologist: Pixie Casino, MD  Primary Electrophysiologist:  None    Patient Profile:   Jillian Eaton is a 69 y.o. female with a hx of permanent atrial fibrillation who is being seen today for the evaluation of exertional tachycardia and dyspnea at the request of Dr. Lorin Mercy.  History of Present Illness:   Jillian Eaton  is a 69 y.o. female with history of longstanding persistent atrial fibrillation, history of cardiomyopathy of critical illness (March 2018) with full recovery (October 2020 echo LVEF 55-60%), morbid obesity, longstanding iron deficiency anemia, presenting with complaints of worsening weakness, fatigue, extreme elevation heart rate with minimal activity and exertional dyspnea.  Was seen in the emergency room on April 15 with shortness of breath.  WBC was mildly elevated then but has since normalized.  She has well-controlled ventricular rates at rest, but after just walking several feet in her home has extreme tachycardia to the rate of 160 and feels exhausted and short of breath.  She is only able to take 25 mg (occasionally 50 mg) of metoprolol succinate the day, because higher doses cause symptomatic hypotension.    She has struggled with obesity for a long time and has had 3 previous gastric bypass procedures, but over the last couple of years has successfully purposely lost about 200.  She denies problems with lower extremity edema or full-blown syncope.  She does not have orthopnea or PND.  She denies anginal chest pain.  She has early satiety.  She denies abdominal pain, nausea vomiting or GI bleeding.  She has a remote history of kidney stones.  She does not have any current urinary complaints.  She has a chronic dry cough.  She was treated for atrial  fibrillation rapid ventricular response at [facility] in [place] in 2019 but multiple attempts at cardioversion were unsuccessful.  She has severe biatrial dilation.  She is on chronic anticoagulation with direct oral anticoagulants..  She has a history of normal coronary arteries by cardiac catheterization in 2012.  Mild to moderate pulmonary hypertension has been documented as far back as 2012 (PA pressure 43/19, mean 31 mmHg at cath in 2012 with a mildly elevated LV EDP, estimated systolic PA pressure around 50 mmHg on multiple subsequent echocardiograms).  Chest CT performed in October 2020 shows an enlarged pulmonary artery consistent with pulmonary hypertension.  Pulmonary function tests show relatively matched restrictive/obstructive parameters with a total lung capacity of roughly 70% of predicted and commensurate reduction in DLCO.  She has had longstanding problems with iron deficiency anemia, with a hemoglobin that typically hovers around 9.5.  She has longstanding problems with thyroid (large goiter, status post thyroidectomy, now on thyroid hormone supplementation) and most recent TSH was minimally elevated at 7.6.  Chronic kidney disease with a baseline creatinine that seems to be around 1.2.  Past Medical History:  Diagnosis Date  . Anemia    iron and pernicious  . Arthritis   . Asthma    flare up last yr  . CHF (congestive heart failure) ([place])   . Chronic headache    " better now "   . Complication of anesthesia    BP dropped   . DOE (dyspnea on exertion)    2D ECHO, 02/12/2012 - EF 60-65%, moderate concentric hypertrophy  . Dysrhythmia   . Fibromyalgia  nerve pain"left side at waist level" "can't lay on that side without pain" , "HOB elevation helps"; pt. thinks this has resolved after 200lb weight loss9-17- since i lost the wieght , i dont  think i have this anymore   . Heart murmur   . Hematuria - cause not known    resolved   . History of kidney stones    x 2 '13,  '14 surgery to remove  . Hypertension   . Pneumonia    aspiration  04/2016  . SBO (small bowel obstruction) ([place])    rqueired admission 2020  . Thyroid disease    "goiter"  . Transfusion history    10 yrs+    Past Surgical History:  Procedure Laterality Date  . ABDOMINAL ADHESION SURGERY  2004   open LOA - in [place]  . CARDIAC CATHETERIZATION  04/04/2010   No significant obstructive coronary artery disease  . CARDIOVERSION N/A 08/08/2017   Procedure: CARDIOVERSION;  Surgeon: Pixie Casino, MD;  Location: [facility];  Service: Cardiovascular;  Laterality: N/A;  . CHOLECYSTECTOMY  1990  . COLONOSCOPY W/ POLYPECTOMY    . COLONOSCOPY WITH PROPOFOL N/A 04/10/2014   Procedure: COLONOSCOPY WITH PROPOFOL;  Surgeon: Beryle Beams, MD;  Location: [facility];  Service: Endoscopy;  Laterality: N/A;  . EYE SURGERY     lasik 20-25 yrs ago  . GASTROPLASTY VERTICAL BANDED  1985  . [place]   In [place] for SBO  . REVERSE SHOULDER ARTHROPLASTY Right 03/29/2018   Procedure: REVERSE SHOULDER ARTHROPLASTY;  Surgeon: Netta Cedars, MD;  Location: [facility];  Service: Orthopedics;  Laterality: Right;  . ROUX-EN-Y GASTRIC BYPASS  1992   Conversion VBG to RnYGB in [place], [place]  . SHOULDER ARTHROSCOPY WITH SUBACROMIAL DECOMPRESSION  2016   Dr Berenice Primas, [place], [place]  . SMALL INTESTINE SURGERY  2000   SBO - LOA w SB resection  . TOTAL KNEE ARTHROPLASTY Left 11/14/2018   Procedure: TOTAL KNEE ARTHROPLASTY;  Surgeon: Paralee Cancel, MD;  Location: [facility];  Service: Orthopedics;  Laterality: Left;  70 mins  . TOTAL KNEE ARTHROPLASTY Right 04/29/2019   Procedure: TOTAL KNEE ARTHROPLASTY;  Surgeon: Paralee Cancel, MD;  Location: [facility];  Service: Orthopedics;  Laterality: Right;  70 mins  . TOTAL THYROIDECTOMY  06/26/2012   Dr Celine Ahr, [facility]  . TUBAL LIGATION  1986  . URETHRAL DILATION  2012   w cystoscopy.  Dr Gaynelle Arabian  . UTERINE FIBROID EMBOLIZATION        Home Medications:  Prior to Admission medications   Medication Sig Start Date End Date Taking? Authorizing Provider  acetaminophen (TYLENOL) 500 MG tablet Take 2 tablets (1,000 mg total) by mouth every 8 (eight) hours. Patient taking differently: Take 1,000 mg by mouth every 6 (six) hours as needed for moderate pain.  04/30/19   Danae Orleans, PA-C  calcitRIOL (ROCALTROL) 0.5 MCG capsule Take 0.5 mcg by mouth in the morning and at bedtime.     [provider]  clotrimazole (LOTRIMIN) 1 % cream APPLY TO AFFECTED AREA TWICE A DAY Patient taking differently: Apply 1 application topically daily as needed (itching).  02/07/19   Marzetta Board, DPM  cyanocobalamin (,VITAMIN B-12,) 1000 MCG/ML injection Inject 1 mL (1,000 mcg total) into the muscle every 30 (thirty) days. 04/05/18   Hennie Duos, MD  cyclobenzaprine (FLEXERIL) 10 MG tablet Take 2 tablets (20 mg total) by mouth 2 (two) times daily. Patient taking differently: Take  10 mg by mouth daily.  04/05/18   Hennie Duos, MD  DULoxetine (CYMBALTA) 60 MG capsule Take 1 capsule (60 mg total) by mouth daily. 04/05/18   Hennie Duos, MD  ELIQUIS 5 MG TABS tablet TAKE 1 TABLET TWICE A DAY Patient taking differently: Take 5 mg by mouth 2 (two) times daily.  02/05/19   Hilty, Nadean Corwin, MD  eszopiclone (LUNESTA) 2 MG TABS tablet Take 1 tablet (2 mg total) by mouth at bedtime. 04/05/18   Hennie Duos, MD  ferrous sulfate (FERROUSUL) 325 (65 FE) MG tablet Take 1 tablet (325 mg total) by mouth 3 (three) times daily with meals for 14 days. Patient taking differently: Take 325 mg by mouth 2 (two) times daily with a meal.  04/30/19 06/13/19  Babish, Rodman Key, PA-C  fluticasone furoate-vilanterol (BREO ELLIPTA) 100-25 MCG/INH AEPB Inhale 1 puff into the lungs daily. 03/14/19   Lauraine Rinne, NP  furosemide (LASIX) 20 MG tablet Take 1 tablet (20 mg total) by mouth as needed. 01/24/19   Hilty, Nadean Corwin, MD  levETIRAcetam (KEPPRA) 500  MG tablet Take 1 tablet (500 mg total) by mouth 2 (two) times daily. Patient taking differently: Take 500 mg by mouth daily.  10/02/18   Suzzanne Cloud, NP  levothyroxine (SYNTHROID) 150 MCG tablet TAKE 1 TABLET BY MOUTH EVERY DAY Patient taking differently: Take 150 mcg by mouth daily before breakfast.  04/07/19   Philemon Kingdom, MD  magnesium oxide (MAG-OX) 400 (241.3 Mg) MG tablet Take 1 tablet by mouth daily. 04/29/19   [provider]  metoprolol succinate (TOPROL-XL) 25 MG 24 hr tablet Take 25 mg by mouth 2 (two) times daily.    [provider]  NONFORMULARY OR COMPOUNDED ITEM [facility]:  Antifungal Cream - Terbinafine 3%, Fluconazole 2%, Tea Tree Oil 5%, Urea 10%, Ibuprofen 2% in DMSO Suspension #77ml. Apply to affected toenail(s) once daily (at bedtime) or twice daily. Patient taking differently: Apply 1 application topically at bedtime. [facility]:  Antifungal Cream - Terbinafine 3%, Fluconazole 2%, Tea Tree Oil 5%, Urea 10%, Ibuprofen 2% in DMSO Suspension #57ml. Apply to affected toenail(s) once daily (at bedtime) or twice daily. 05/07/18   Marzetta Board, DPM  traZODone (DESYREL) 50 MG tablet Take 1 tablet (50 mg total) by mouth at bedtime. 04/05/18   Hennie Duos, MD  Vitamin D, Ergocalciferol, (DRISDOL) 1.25 MG (50000 UT) CAPS capsule Take 1 capsule (50,000 Units total) by mouth every Wednesday. 04/10/18   Hennie Duos, MD    Inpatient Medications: Scheduled Meds: . apixaban  5 mg Oral BID  . calcitRIOL  0.5 mcg Oral BID  . [START ON 06/15/2019] digoxin  0.125 mg Oral Daily  . digoxin  0.25 mg Oral Q2H  . docusate sodium  100 mg Oral BID  . [START ON 06/15/2019] DULoxetine  60 mg Oral Daily  . ferrous sulfate  325 mg Oral BID WC  . [START ON 06/15/2019] fluticasone furoate-vilanterol  1 puff Inhalation Daily  . levETIRAcetam  500 mg Oral Daily  . [START ON 06/15/2019] levothyroxine  150 mcg Oral QAC breakfast  . [START ON 06/15/2019]  magnesium oxide  400 mg Oral Daily  . [START ON 06/15/2019] metoprolol succinate  25 mg Oral Daily  . sodium chloride flush  3 mL Intravenous Q12H  . traZODone  50 mg Oral QHS  . zolpidem  5 mg Oral QHS   Continuous Infusions:  PRN Meds: acetaminophen **OR** acetaminophen, albuterol, bisacodyl,  cyclobenzaprine, hydrALAZINE, morphine injection, ondansetron **OR** ondansetron (ZOFRAN) IV, polyethylene glycol, zolpidem  Allergies:    Allergies  Allergen Reactions  . Hydrocodone Itching         Social History:   Social History   Socioeconomic History  . Marital status: Married    Spouse name: Not on file  . Number of children: 3  . Years of education: Not on file  . Highest education level: Master's degree (e.g., MA, MS, MEng, MEd, MSW, MBA)  Occupational History  . Occupation: retired  Tobacco Use  . Smoking status: Never Smoker  . Smokeless tobacco: Never Used  Substance and Sexual Activity  . Alcohol use: No    Comment: wine occ  . Drug use: No    Types: Oxycodone    Comment: perscribed  . Sexual activity: Not Currently    Birth control/protection: None  Other Topics Concern  . Not on file  Social History Narrative   Right handed   1 cup of caffeine per day        Social Determinants of Health   Financial Resource Strain:   . Difficulty of Paying Living Expenses:   Food Insecurity:   . Worried About Charity fundraiser in the Last Year:   . Arboriculturist in the Last Year:   Transportation Needs:   . Film/video editor (Medical):   Marland Kitchen Lack of Transportation (Non-Medical):   Physical Activity:   . Days of Exercise per Week:   . Minutes of Exercise per Session:   Stress:   . Feeling of Stress :   Social Connections:   . Frequency of Communication with Friends and Family:   . Frequency of Social Gatherings with Friends and Family:   . Attends Religious Services:   . Active Member of Clubs or Organizations:   . Attends Archivist Meetings:     Marland Kitchen Marital Status:   Intimate Partner Violence:   . Fear of Current or Ex-Partner:   . Emotionally Abused:   Marland Kitchen Physically Abused:   . Sexually Abused:     Family History:    Family History  Problem Relation Age of Onset  . Diabetes Mother   . Epilepsy Mother   . Cancer Mother        Breast  . Hypertension Mother   . Breast cancer Mother   . Seizures Mother   . Kidney disease Father   . Diabetes Father   . Hypertension Father   . Asthma Father   . Heart disease Father   . Epilepsy Sister   . Cancer Maternal Grandmother   . Breast cancer Maternal Grandmother   . Cancer Paternal Grandmother   . Breast cancer Paternal Grandmother      ROS:  Please see the history of present illness.   All other ROS reviewed and negative.     Physical Exam/Data:   Vitals:   06/14/19 1525 06/14/19 1545 06/14/19 1600 06/14/19 1615  BP: 108/80 104/77 110/77 112/72  Pulse: 68 82 79 82  Resp: 20 (!) 23 (!) 22 20  Temp:      TempSrc:      SpO2: 94% 100% 98% 97%   No intake or output data in the 24 hours ending 06/14/19 1757 Last 3 Weights 06/13/2019 06/05/2019 05/15/2019  Weight (lbs) 204 lb 12.8 oz 203 lb 217 lb 6.4 oz  Weight (kg) 92.897 kg 92.08 kg 98.612 kg     There is no height or weight  on file to calculate BMI.  General:  Well nourished, well developed, in no acute distress.  Severely obese HEENT: normal Lymph: no adenopathy Neck: no JVD Endocrine:  No thryomegaly Vascular: No carotid bruits; FA pulses 2+ bilaterally without bruits  Cardiac:  normal S1, S2; irregular; no murmur, no gallop Lungs:  clear to auscultation bilaterally, no wheezing, rhonchi or rales  Abd: soft, nontender, no hepatomegaly  Ext: no edema Musculoskeletal:  No deformities, BUE and BLE strength normal and equal Skin: warm and dry  Neuro:  CNs 2-12 intact, no focal abnormalities noted Psych:  Normal affect   EKG:  The EKG was personally reviewed and demonstrates: Atrial fibrillation with rapid  ventricular response 104 bpm, otherwise normal tracing Telemetry:  Telemetry was personally reviewed and demonstrates: Atrial fibrillation with controlled ventricular response at rest mostly in the 70s.  Relevant CV Studies: 12/08/2018 echocardiogram 1. Left ventricular ejection fraction, by visual estimation, is 55 to  60%. The left ventricle has normal function. Normal left ventricular size.  There is no left ventricular hypertrophy.  2. Left ventricular diastolic function could not be evaluated pattern of  LV diastolic filling.  3. Global right ventricle has normal systolic function.The right  ventricular size is mildly enlarged. No increase in right ventricular wall  thickness.  4. Left atrial size was severely dilated.  5. Right atrial size was severely dilated.  6. The mitral valve is normal in structure. Trace mitral valve  regurgitation.  7. The tricuspid valve is normal in structure. Tricuspid valve  regurgitation mild-moderate.  8. The aortic valve is normal in structure. Aortic valve regurgitation is  trivial by color flow Doppler.  9. The pulmonic valve was grossly normal. Pulmonic valve regurgitation is  not visualized by color flow Doppler.  10. Normal pulmonary artery systolic pressure.  11. The inferior vena cava is normal in size with greater than 50%  respiratory variability, suggesting right atrial pressure of 3 mmHg.  TRICUSPID VALVE      TR Peak grad:  24.8 mmHg       TR Vmax:    249.00 cm/s   Laboratory Data:  High Sensitivity Troponin:   Recent Labs  Lab 06/14/19 1430  TROPONINIHS 3     Chemistry Recent Labs  Lab 06/13/19 1429 06/14/19 1430  NA 137 138  K 4.1 4.3  CL 99 101  CO2 29 25  GLUCOSE 82 82  BUN 15 17  CREATININE 1.25* 1.23*  CALCIUM 8.3* 8.3*  GFRNONAA  --  45*  GFRAA  --  53*  ANIONGAP  --  12    No results for input(s): PROT, ALBUMIN, AST, ALT, ALKPHOS, BILITOT in the last 168 hours. Hematology Recent  Labs  Lab 06/13/19 1639 06/14/19 1430  WBC 8.9 7.0  RBC 4.03 3.51*  HGB 11.2 9.7*  HCT 36.9 32.0*  MCV 92 91.2  MCH 27.8 27.6  MCHC 30.4* 30.3  RDW 14.9 15.9*  PLT 360 320   BNP Recent Labs  Lab 06/13/19 1429 06/14/19 1430  BNP  --  141.0*  PROBNP 251.0*  --     DDimer No results for input(s): DDIMER in the last 168 hours.   Radiology/Studies:  DG Chest Port 1 View  Result Date: 06/14/2019 CLINICAL DATA:  Cough. EXAM: PORTABLE CHEST 1 VIEW COMPARISON:  June 05, 2019 FINDINGS: Stable cardiomegaly. The hila and mediastinum are unchanged. No pneumothorax. No nodules or masses. No focal infiltrates or overt edema. IMPRESSION: No active disease. Electronically Signed  By: Dorise Bullion III M.D   On: 06/14/2019 14:50     Assessment and Plan:   1. Afib: Has excellent ventricular rate control at rest, but developed RVR with minimal physical activity, associated with fatigue and dyspnea.  Her blood pressure does not allow higher doses of beta-blocker or addition of calcium channel blockers.  We will "load" on digoxin today with plan for daily dose of digoxin.  The only other option for ventricular rate control would be amiodarone, associated with higher side effect profile. 2. CHF: We will recheck her echocardiogram.  Unable to evaluate diastolic function by echocardiography due to the arrhythmia, but she was noted to have mildly elevated filling pressures at cardiac catheterization 2012 (LVEDP 20, mean PAWP 24, mean PAP 31).  In fact, her pulmonary artery hypertension could be attributed entirely to the elevated left heart pressures at that time (transpulmonary gradient 7 mmHg).  The mechanism of diastolic dysfunction is uncertain since she does not have significant left ventricular hypertrophy.  Obesity likely playing a role.  Had transient reduction in LVEF during acute illness in the past, but several echocardiograms performed since then have shown normal LVEF around 55-60%.   Currently not receiving diuretics.  We may be compelled to repeat a right heart catheterization to clarify her hemodynamic status. 3. PAH: At least in part due to diastolic dysfunction, consider contribution of obesity. 4. Severe obesity with comorbid conditions: She has actually done an exceptional job of losing weight from super obesity to morbid obesity to currently only severely obese over a period of just 2 years. 5. Anemia: Recent labs show a transferrin saturation of only 10%, although ferritin is in normal range.  Normocytic erythrocyte parameters.  Normal hemoglobin electrophoresis in the past.  Previously checked vitamin B12 has been high. 6. CKD 3a: Baseline creatinine seems to be around 1.2. 7. History of moderate persistent asthma      For questions or updates, please contact [place] Please consult [email or web address] for contact info under     Signed, Sanda Klein, MD  06/14/2019 5:57 PM

## 2019-06-14 NOTE — ED Provider Notes (Signed)
[facility] EMERGENCY DEPARTMENT Provider Note   CSN: [id number] Arrival date & time: 06/14/19  1406     History Chief Complaint  Patient presents with  . afib    Jillian Eaton is a 69 y.o. female.  She has a history of A. fib CHF hypothyroidism and is on anticoagulation.  She is complaining of 1 month of fatigue nausea decreased appetite weight loss shortness of breath and elevated heart rate with any type of exertion.  For the last 2 days she has had some chest tightness.  She saw her cardiologist yesterday and they set her up for an echo on Monday.  They also recommended that she come to the ED if she felt worse.  She said she felt worse today and so called the ambulance.  She had a nonproductive cough.  She is nauseous and had some diarrhea and vomiting.  No abdominal pain.  No urinary symptoms.  No recent falls.  No change in medications.  She was Covid negative as of 10 days ago and has had her vaccine.  The history is provided by the patient.  Weakness Severity:  Severe Onset quality:  Gradual Duration:  4 weeks Timing:  Constant Progression:  Worsening Chronicity:  New Context: not change in medication, not recent infection and not urinary tract infection   Relieved by:  Nothing Worsened by:  Activity Ineffective treatments:  None tried Associated symptoms: chest pain, cough, diarrhea, dizziness, nausea, shortness of breath and vomiting   Associated symptoms: no abdominal pain, no aphasia, no dysphagia, no dysuria, no falls, no fever, no foul-smelling urine, no loss of consciousness, no melena, no seizures, no stroke symptoms and no syncope        Past Medical History:  Diagnosis Date  . Anemia    iron and pernicious  . Arthritis   . Asthma    flare up last yr  . CHF (congestive heart failure) ([place])   . Chronic headache    " better now "   . Complication of anesthesia    BP dropped   . DOE (dyspnea on exertion)    2D ECHO, 02/12/2012  - EF 60-65%, moderate concentric hypertrophy  . Dysrhythmia   . Fibromyalgia    nerve pain"left side at waist level" "can't lay on that side without pain" , "HOB elevation helps"; pt. thinks this has resolved after 200lb weight loss9-17- since i lost the wieght , i dont  think i have this anymore   . Heart murmur   . Hematuria - cause not known    resolved   . History of kidney stones    x 2 '13, '14 surgery to remove  . Hypertension   . Pneumonia    aspiration  04/2016  . SBO (small bowel obstruction) ([place]) 06/07/2013  . SBO (small bowel obstruction) ([place])    rqueired admission 2020  . Thyroid disease    "goiter"  . Transfusion history    10 yrs+    Patient Active Problem List   Diagnosis Date Noted  . S/P right TKA 04/29/2019  . Medication management 03/11/2019  . Stiffness of left knee 12/12/2018  . Acute on chronic respiratory failure with hypoxia ([place]) 12/07/2018  . Hypothyroidism 12/07/2018  . Normocytic anemia 12/07/2018  . Asthma 12/07/2018  . S/P left TKA 11/14/2018  . Status post total left knee replacement 11/14/2018  . Degenerative joint disease involving multiple joints on both sides of body 07/31/2018  . Rotator cuff tear  arthropathy of right shoulder 04/05/2018  . Overactive bladder 04/05/2018  . Steroid-induced hyperglycemia   . Generalized OA   . 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 NICM (nonischemic cardiomyopathy) ([place]) 01/15/2018  . Osteoporosis 12/13/2017  . Rotator cuff tear arthropathy 11/29/2017  . Chronic diastolic CHF (congestive heart failure) ([place]) 09/28/2017  . Acute renal failure superimposed on stage 3 chronic kidney disease ([place]) 09/07/2017  . Hypomagnesemia with secondary hypocalcemia 09/07/2017  . 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 Hypercapnemia 07/13/2017  . AKI (acute kidney injury) ([place]) 07/10/2017  . Vertigo 07/08/2017  . Blurring of visual image 07/08/2017  . On anticoagulant therapy 06/19/2017  .  Chronic respiratory failure with hypoxia ([place]) 04/19/2017  . GERD (gastroesophageal reflux disease) 08/28/2016  . 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 Depressive disorder 08/28/2016  . Iatrogenic hypocalcemia 08/28/2016  . Chronic pain syndrome   . Acute diastolic (congestive) heart failure ([place])   . Oropharyngeal dysphagia   . Chronic atrial fibrillation ([place]) 04/28/2016  . Vocal cord dysfunction 04/28/2016  . Thrombocytopenia ([place]) 04/28/2016  . Seizures ([place])   . Preoperative cardiovascular examination   . 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  . Leg swelling 10/19/2015  . Acute on chronic respiratory failure with hypoxia and hypercapnia ([place]) 08/03/2015  . Bilateral leg edema 05/11/2015  . Essential hypertension 05/11/2015  . Nephrolithiasis 04/12/2015  . Numbness in both hands 04/12/2015  . Complete tear of left rotator cuff 11/19/2014  . Primary osteoarthritis of both knees 03/24/2014  . Chronic asthmatic bronchitis ([place]) 07/30/2013  . Bariatric surgery status 07/30/2013  . 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 Urge incontinence of urine 07/30/2013  . Vitamin D deficiency 07/30/2013  . B-complex deficiency 07/30/2013  . Cough 07/30/2013  . Gross hematuria 07/30/2013  . Goiter 07/30/2013  . Palpitations 07/30/2013  . Right flank pain 07/30/2013  . SBO (small bowel obstruction) ([place]) 06/07/2013  . Pulmonary HTN ([place]) 01/08/2013  . Insomnia 03/22/2012  . Mild intermittent asthma with acute exacerbation 03/22/2012  . Fibromyalgia 10/26/2011  . Anemia of chronic disease 03/28/2011  . Obesity (BMI 30-39.9) 09/22/2010    Past Surgical History:  Procedure Laterality Date  . ABDOMINAL ADHESION SURGERY  2004   open LOA - in [place]  . CARDIAC CATHETERIZATION  04/04/2010   No significant obstructive coronary artery disease  . CARDIOVERSION N/A 08/08/2017   Procedure: CARDIOVERSION;  Surgeon: Pixie Casino, MD;  Location: [facility];  Service: Cardiovascular;   Laterality: N/A;  . CHOLECYSTECTOMY  1990  . COLONOSCOPY W/ POLYPECTOMY    . COLONOSCOPY WITH PROPOFOL N/A 04/10/2014   Procedure: COLONOSCOPY WITH PROPOFOL;  Surgeon: Beryle Beams, MD;  Location: [facility];  Service: Endoscopy;  Laterality: N/A;  . EYE SURGERY     lasik 20-25 yrs ago  . GASTROPLASTY VERTICAL BANDED  1985  . [place]   In [place] for SBO  . REVERSE SHOULDER ARTHROPLASTY Right 03/29/2018   Procedure: REVERSE SHOULDER ARTHROPLASTY;  Surgeon: Netta Cedars, MD;  Location: [facility];  Service: Orthopedics;  Laterality: Right;  . ROUX-EN-Y GASTRIC BYPASS  1992   Conversion VBG to RnYGB in [place], [place]  . SHOULDER ARTHROSCOPY WITH SUBACROMIAL DECOMPRESSION  2016   Dr Berenice Primas, [place], [place]  . SMALL INTESTINE SURGERY  2000   SBO - LOA w SB resection  . TOTAL KNEE ARTHROPLASTY Left 11/14/2018   Procedure: TOTAL KNEE ARTHROPLASTY;  Surgeon: Paralee Cancel, MD;  Location: [facility];  Service: Orthopedics;  Laterality: Left;  70 mins  . TOTAL KNEE ARTHROPLASTY Right 04/29/2019   Procedure: TOTAL KNEE ARTHROPLASTY;  Surgeon: Paralee Cancel, MD;  Location: [facility];  Service: Orthopedics;  Laterality: Right;  70 mins  . TOTAL THYROIDECTOMY  06/26/2012   Dr Celine Ahr, [facility]  . TUBAL LIGATION  1986  . URETHRAL DILATION  2012   w cystoscopy.  Dr Gaynelle Arabian  . UTERINE FIBROID EMBOLIZATION       OB History   No obstetric history on file.     Family History  Problem Relation Age of Onset  . Diabetes Mother   . Epilepsy Mother   . Cancer Mother        Breast  . Hypertension Mother   . Breast cancer Mother   . Seizures Mother   . Kidney disease Father   . Diabetes Father   . Hypertension Father   . Asthma Father   . Heart disease Father   . Epilepsy Sister   . Cancer Maternal Grandmother   . Breast cancer Maternal Grandmother   . Cancer Paternal Grandmother   . Breast cancer Paternal Grandmother     Social History   Tobacco  Use  . Smoking status: Never Smoker  . Smokeless tobacco: Never Used  Substance Use Topics  . Alcohol use: No    Comment: wine occ  . Drug use: No    Types: Oxycodone    Comment: perscribed    Home Medications Prior to Admission medications   Medication Sig Start Date End Date Taking? Authorizing Provider  acetaminophen (TYLENOL) 500 MG tablet Take 2 tablets (1,000 mg total) by mouth every 8 (eight) hours. Patient taking differently: Take 1,000 mg by mouth every 6 (six) hours as needed for moderate pain.  04/30/19   Danae Orleans, PA-C  calcitRIOL (ROCALTROL) 0.5 MCG capsule Take 0.5 mcg by mouth in the morning and at bedtime.     [provider]  clotrimazole (LOTRIMIN) 1 % cream APPLY TO AFFECTED AREA TWICE A DAY Patient taking differently: Apply 1 application topically daily as needed (itching).  02/07/19   Marzetta Board, DPM  cyanocobalamin (,VITAMIN B-12,) 1000 MCG/ML injection Inject 1 mL (1,000 mcg total) into the muscle every 30 (thirty) days. 04/05/18   Hennie Duos, MD  cyclobenzaprine (FLEXERIL) 10 MG tablet Take 2 tablets (20 mg total) by mouth 2 (two) times daily. Patient taking differently: Take 10 mg by mouth daily.  04/05/18   Hennie Duos, MD  DULoxetine (CYMBALTA) 60 MG capsule Take 1 capsule (60 mg total) by mouth daily. 04/05/18   Hennie Duos, MD  ELIQUIS 5 MG TABS tablet TAKE 1 TABLET TWICE A DAY Patient taking differently: Take 5 mg by mouth 2 (two) times daily.  02/05/19   Hilty, Nadean Corwin, MD  eszopiclone (LUNESTA) 2 MG TABS tablet Take 1 tablet (2 mg total) by mouth at bedtime. 04/05/18   Hennie Duos, MD  ferrous sulfate (FERROUSUL) 325 (65 FE) MG tablet Take 1 tablet (325 mg total) by mouth 3 (three) times daily with meals for 14 days. Patient taking differently: Take 325 mg by mouth 2 (two) times daily with a meal.  04/30/19 06/13/19  Babish, Rodman Key, PA-C  fluticasone furoate-vilanterol (BREO ELLIPTA) 100-25 MCG/INH AEPB Inhale 1  puff into the lungs daily. 03/14/19   Lauraine Rinne, NP  furosemide (LASIX) 20 MG tablet Take 1 tablet (20 mg total) by mouth as needed. 01/24/19   Hilty, Nadean Corwin, MD  levETIRAcetam (KEPPRA) 500 MG tablet Take 1 tablet (500 mg total) by mouth 2 (two) times daily. Patient taking differently: Take 500 mg by mouth daily.  10/02/18   Suzzanne Cloud, NP  levothyroxine (SYNTHROID) 150 MCG tablet TAKE 1 TABLET BY MOUTH EVERY DAY Patient taking differently: Take 150 mcg by mouth daily before breakfast.  04/07/19   Philemon Kingdom, MD  magnesium oxide (MAG-OX) 400 (241.3 Mg) MG tablet Take 1 tablet by mouth daily. 04/29/19   [provider]  metoprolol succinate (TOPROL-XL) 25 MG 24 hr tablet Take 25 mg by mouth 2 (two) times daily.    [provider]  NONFORMULARY OR COMPOUNDED ITEM  Apothecary:  Antifungal Cream - Terbinafine 3%, Fluconazole 2%, Tea Tree Oil 5%, Urea 10%, Ibuprofen 2% in DMSO Suspension #60ml. Apply to affected toenail(s) once daily (at bedtime) or twice daily. Patient taking differently: Apply 1 application topically at bedtime. [facility]:  Antifungal Cream - Terbinafine 3%, Fluconazole 2%, Tea Tree Oil 5%, Urea 10%, Ibuprofen 2% in DMSO Suspension #47ml. Apply to affected toenail(s) once daily (at bedtime) or twice daily. 05/07/18   Marzetta Board, DPM  traZODone (DESYREL) 50 MG tablet Take 1 tablet (50 mg total) by mouth at bedtime. 04/05/18   Hennie Duos, MD  Vitamin D, Ergocalciferol, (DRISDOL) 1.25 MG (50000 UT) CAPS capsule Take 1 capsule (50,000 Units total) by mouth every Wednesday. 04/10/18   Hennie Duos, MD    Allergies    Hydrocodone  Review of Systems   Review of Systems  Constitutional: Positive for appetite change and chills. Negative for fever.  HENT: Negative for sore throat.   Eyes: Negative for visual disturbance.  Respiratory: Positive for cough and shortness of breath.   Cardiovascular: Positive for chest pain.  Negative for syncope.  Gastrointestinal: Positive for diarrhea, nausea and vomiting. Negative for abdominal pain, dysphagia and melena.  Genitourinary: Negative for dysuria.  Musculoskeletal: Negative for falls and neck pain.  Skin: Negative for rash.  Neurological: Positive for dizziness and weakness. Negative for seizures and loss of consciousness.    Physical Exam Updated Vital Signs BP 108/77 (BP Location: Left Arm)   Pulse 94   Temp 98.2 F (36.8 C) (Oral)   Resp (!) 21   SpO2 100%   Physical Exam Vitals and nursing note reviewed.  Constitutional:      General: She is not in acute distress.    Appearance: Normal appearance. She is well-developed.  HENT:     Head: Normocephalic and atraumatic.  Eyes:     Conjunctiva/sclera: Conjunctivae normal.  Cardiovascular:     Rate and Rhythm: Normal rate. Rhythm irregular.     Pulses: Normal pulses.     Heart sounds: No murmur.  Pulmonary:     Effort: Pulmonary effort is normal. No respiratory distress.     Breath sounds: Normal breath sounds.  Abdominal:     Palpations: Abdomen is soft.     Tenderness: There is no abdominal tenderness.  Musculoskeletal:        General: No deformity or signs of injury. Normal range of motion.     Cervical back: Neck supple.  Skin:    General: Skin is warm and dry.     Capillary Refill: Capillary refill takes less than 2 seconds.  Neurological:     General: No focal deficit present.     Mental Status: She is alert and oriented to person, place, and time.     Sensory: No sensory deficit.  Motor: No weakness.     ED Results / Procedures / Treatments   Labs (all labs ordered are listed, but only abnormal results are displayed) Labs Reviewed  BASIC METABOLIC PANEL - Abnormal; Notable for the following components:      Result Value   Creatinine, Ser 1.23 (*)    Calcium 8.3 (*)    GFR calc non Af Amer 45 (*)    GFR calc Af Amer 53 (*)    All other components within normal limits    BRAIN NATRIURETIC PEPTIDE - Abnormal; Notable for the following components:   B Natriuretic Peptide 141.0 (*)    All other components within normal limits  CBC WITH DIFFERENTIAL/PLATELET - Abnormal; Notable for the following components:   RBC 3.51 (*)    Hemoglobin 9.7 (*)    HCT 32.0 (*)    RDW 15.9 (*)    All other components within normal limits  PROTIME-INR - Abnormal; Notable for the following components:   Prothrombin Time 19.3 (*)    INR 1.6 (*)    All other components within normal limits  RESPIRATORY PANEL BY RT PCR (FLU A&B, COVID)  T3, FREE  T4  BASIC METABOLIC PANEL  CBC  SEDIMENTATION RATE  C-REACTIVE PROTEIN  TROPONIN I (HIGH SENSITIVITY)  TROPONIN I (HIGH SENSITIVITY)    EKG EKG Interpretation  Date/Time:  Saturday June 14 2019 14:12:29 EDT Ventricular Rate:  104 PR Interval:    QRS Duration: 90 QT Interval:  337 QTC Calculation: 444 R Axis:   72 Text Interpretation: Atriial fib nonspecific t waves No significant change since prior 4/21 Confirmed by Aletta Edouard [id number]) on 06/14/2019 2:23:22 PM   Radiology CT CHEST W CONTRAST  Result Date: 06/14/2019 CLINICAL DATA:  Nausea, vomiting and shortness of breath. EXAM: CT CHEST, ABDOMEN, AND PELVIS WITH CONTRAST TECHNIQUE: Multidetector CT imaging of the chest, abdomen and pelvis was performed following the standard protocol during bolus administration of intravenous contrast. CONTRAST:  125mL OMNIPAQUE IOHEXOL 300 MG/ML  SOLN COMPARISON:  None. FINDINGS: CT CHEST FINDINGS Cardiovascular: No intraluminal filling defects are seen within the pulmonary arteries. The pulmonary inflow tract measures approximately 3.8 cm while the right pulmonary artery measures 3.8 cm. Normal heart size. No pericardial effusion. Mediastinum/Nodes: No enlarged mediastinal, hilar, or axillary lymph nodes. Thyroid gland, trachea, and esophagus demonstrate no significant findings. Lungs/Pleura: Mild linear scarring and/or atelectasis is  seen within the right lower lobe. There is no evidence of a pleural effusion or pneumothorax. Musculoskeletal: A right shoulder prosthesis is seen with associated streak artifact. Multilevel degenerative changes seen throughout the thoracic spine. CT ABDOMEN PELVIS FINDINGS Hepatobiliary: No focal liver abnormality is seen. Status post cholecystectomy. No biliary dilatation. Pancreas: Unremarkable. No pancreatic ductal dilatation or surrounding inflammatory changes. Spleen: Normal in size without focal abnormality. Adrenals/Urinary Tract: Adrenal glands are unremarkable. There is mild asymmetric enlargement of the right kidney. The left kidney is normal in size. There is marked severity right-sided hydronephrosis and hydroureter with delayed renal cortical enhancement seen throughout the right kidney. A 9 mm obstructing renal stone is seen in the distal right ureter, with additional 7 mm obstructing renal stone seen at the right UVJ. Bladder is unremarkable. Stomach/Bowel: Multiple surgical clips and surgical sutures are seen within the gastric region. There is a small hiatal hernia. Surgically anastomosed bowel is seen within the anterior aspect of the right upper quadrant. Appendix appears normal. No evidence of bowel wall thickening, distention, or inflammatory changes. Vascular/Lymphatic: No significant vascular findings are  present. No enlarged abdominal or pelvic lymph nodes. Reproductive: Multiple coarse calcifications of various sizes are seen scattered throughout the uterus. Other: No abdominal wall hernia or abnormality. No abdominopelvic ascites. Musculoskeletal: Multilevel degenerative changes are seen throughout the lumbar spine. IMPRESSION: 1. 9 mm obstructing renal stone in the distal right ureter, with additional 7 mm obstructing renal stone seen at the right UVJ. 2. Evidence of prior gastric bypass surgery. 3. Small hiatal hernia. 4. No acute or active cardiopulmonary disease. Electronically Signed    By: Virgina Norfolk M.D.   On: 06/14/2019 19:33   CT ABDOMEN PELVIS W CONTRAST  Result Date: 06/14/2019 CLINICAL DATA:  Weakness, fatigue and increased heart rate. EXAM: CT CHEST, ABDOMEN, AND PELVIS WITH CONTRAST TECHNIQUE: Multidetector CT imaging of the chest, abdomen and pelvis was performed following the standard protocol during bolus administration of intravenous contrast. CONTRAST:  134mL OMNIPAQUE IOHEXOL 300 MG/ML  SOLN COMPARISON:  January 04, 2019 FINDINGS: CT CHEST FINDINGS Cardiovascular: No intraluminal filling defects are seen within the pulmonary arteries. The pulmonary inflow tract measures approximately 3.8 cm while the right pulmonary artery measures 3.8 cm. Normal heart size. No pericardial effusion. Mediastinum/Nodes: Lungs/Pleura: Mild linear scarring and/or atelectasis is seen within the right lower lobe. There is no evidence of a pleural effusion or pneumothorax. Musculoskeletal: A right shoulder prosthesis is seen with associated streak artifact. Multilevel degenerative changes seen throughout the thoracic spine. CT ABDOMEN PELVIS FINDINGS Hepatobiliary: No focal liver abnormality is seen. Status post cholecystectomy. No biliary dilatation. Pancreas: Unremarkable. No pancreatic ductal dilatation or surrounding inflammatory changes. Spleen: Normal in size without focal abnormality. Adrenals/Urinary Tract: Adrenal glands are unremarkable. There is mild asymmetric enlargement of the right kidney. The left kidney is normal in size. There is marked severity right-sided hydronephrosis and hydroureter with delayed renal cortical enhancement seen throughout the right kidney. A 9 mm obstructing renal stone is seen in the distal right ureter, with additional 7 mm obstructing renal stone seen at the right UVJ. Bladder is unremarkable. Stomach/Bowel: Multiple surgical clips and surgical sutures are seen within the gastric region. There is a small hiatal hernia. Surgically anastomosed bowel is  seen within the anterior aspect of the right upper quadrant. Appendix appears normal. No evidence of bowel wall thickening, distention, or inflammatory changes. Vascular/Lymphatic: No significant vascular findings are present. No enlarged abdominal or pelvic lymph nodes. Reproductive: Multiple coarse calcifications of various sizes are seen scattered throughout the uterus. Other: No abdominal wall hernia or abnormality. No abdominopelvic ascites. Musculoskeletal: Multilevel degenerative changes are seen throughout the lumbar spine. IMPRESSION: 1. 9 mm obstructing renal stone in the distal right ureter, with additional 7 mm obstructing renal stone seen at the right UVJ. 2. Evidence of prior gastric bypass surgery. 3. Small hiatal hernia. 4. No acute or active cardiopulmonary disease. Electronically Signed   By: Virgina Norfolk M.D.   On: 06/14/2019 19:31   DG Chest Port 1 View  Result Date: 06/14/2019 CLINICAL DATA:  Cough. EXAM: PORTABLE CHEST 1 VIEW COMPARISON:  June 05, 2019 FINDINGS: Stable cardiomegaly. The hila and mediastinum are unchanged. No pneumothorax. No nodules or masses. No focal infiltrates or overt edema. IMPRESSION: No active disease. Electronically Signed   By: Dorise Bullion III M.D   On: 06/14/2019 14:50    Procedures Procedures (including critical care time)  Medications Ordered in ED Medications  metoprolol succinate (TOPROL-XL) 24 hr tablet 25 mg (has no administration in time range)  DULoxetine (CYMBALTA) DR capsule 60 mg (has no administration in  time range)  zolpidem (AMBIEN) tablet 5 mg (has no administration in time range)  traZODone (DESYREL) tablet 50 mg (has no administration in time range)  calcitRIOL (ROCALTROL) capsule 0.5 mcg (has no administration in time range)  levothyroxine (SYNTHROID) tablet 150 mcg (has no administration in time range)  apixaban (ELIQUIS) tablet 5 mg (has no administration in time range)  ferrous sulfate tablet 325 mg (has no  administration in time range)  cyclobenzaprine (FLEXERIL) tablet 5 mg (has no administration in time range)  levETIRAcetam (KEPPRA) tablet 500 mg (has no administration in time range)  magnesium oxide (MAG-OX) tablet 400 mg (has no administration in time range)  fluticasone furoate-vilanterol (BREO ELLIPTA) 100-25 MCG/INH 1 puff (has no administration in time range)  acetaminophen (TYLENOL) tablet 650 mg (has no administration in time range)    Or  acetaminophen (TYLENOL) suppository 650 mg (has no administration in time range)  morphine 2 MG/ML injection 2 mg (has no administration in time range)  zolpidem (AMBIEN) tablet 5 mg (has no administration in time range)  docusate sodium (COLACE) capsule 100 mg (has no administration in time range)  polyethylene glycol (MIRALAX / GLYCOLAX) packet 17 g (has no administration in time range)  bisacodyl (DULCOLAX) EC tablet 5 mg (has no administration in time range)  ondansetron (ZOFRAN) tablet 4 mg (has no administration in time range)    Or  ondansetron (ZOFRAN) injection 4 mg (has no administration in time range)  hydrALAZINE (APRESOLINE) injection 5 mg (has no administration in time range)  sodium chloride flush (NS) 0.9 % injection 3 mL (has no administration in time range)  digoxin (LANOXIN) tablet 0.25 mg (0.25 mg Oral Given 06/14/19 2042)  digoxin (LANOXIN) tablet 0.125 mg (has no administration in time range)  albuterol (PROVENTIL) (2.5 MG/3ML) 0.083% nebulizer solution 2.5 mg (has no administration in time range)  iohexol (OMNIPAQUE) 300 MG/ML solution 100 mL (100 mLs Intravenous Contrast Given 06/14/19 1901)    ED Course  I have reviewed the triage vital signs and the nursing notes.  Pertinent labs & imaging results that were available during my care of the patient were reviewed by me and considered in my medical decision making (see chart for details).  Clinical Course as of Jun 13 2198  Sat Jun 14, 2019  1435 Chest x-ray interpreted  by me as cardiomegaly no gross infiltrates.   [MB]  [id number] Reviewed labs from yesterday, she has an elevation of her BNP and TSH.  Creatinine stable mildly elevated.  Reviewed the cardiology note from yesterday and they comment upon being worried that her EF might have dropped and have set her up for an echo on Monday.   [MB]  [id number] Discussed with cardiology Dr. Margaretann Loveless who said she would consult on the patient but is recommending a medicine admission.   [MB]  [id number] Discussed with physician from the family practice team for an unassigned admission.  They said that they are capped.  Discussed with Dr. Lorin Mercy from [facility] hospitalist who will evaluate the patient for admission.   [MB]  2159 GFR, Est Non African American(!): 45 [MB]    Clinical Course User Index [MB] Hayden Rasmussen, MD   MDM Rules/Calculators/A&P                     This patient complains of weakness weight loss fatigue increased heart rate cough shortness of breath chest pain; this involves an extensive number of treatment Options and is a complaint that carries with  it a high risk of complications and Morbidity. The differential includes malignancy, infection, cardiomyopathy, anemia, metabolic derangement.  I ordered, reviewed and interpreted labs, which included chemistry showing a baseline low hemoglobin of 9.7.  Normal white count normal chemistries, stable creatinine initial troponin of 3 normal, slightly elevated BNP of 141. I ordered imaging studies which included chest x-ray and I independently    visualized and interpreted imaging which showed no gross infiltrates or pneumothorax  Previous records obtained and reviewed including pulmonology and cardiology visits within the last week I consulted Dr. Margaretann Loveless cardiology and Dr. Inda Merlin [facility] and discussed lab and imaging findings  After the interventions stated above, I reevaluated the patient and found her symptoms to be essentially unchanged.  She will need  admission for likely further management of her A. fib with RVR if symptomatic shortness of breath CHA2DS2/VAS Stroke Risk Points  Current as of 11 minutes ago     6 >= 2 Points: High Risk  1 - 1.99 Points: Medium Risk  0 Points: Low Risk    This is the only CHA2DS2/VAS Stroke Risk Points available for the past  year.: Last Change: N/A     Details    This score determines the patient's risk of having a stroke if the  patient has atrial fibrillation.       Points Metrics  1 Has Congestive Heart Failure:  Yes    Current as of 11 minutes ago  0 Has Vascular Disease:  No    Current as of 11 minutes ago  1 Has Hypertension:  Yes    Current as of 11 minutes ago  1 Age:  83    Current as of 11 minutes ago  0 Has Diabetes:  No    Current as of 11 minutes ago  2 Had Stroke:  Yes  Had TIA:  No  Had thromboembolism:  No    Current as of 11 minutes ago  1 Female:  Yes    Current as of 11 minutes ago           Final Clinical Impression(s) / ED Diagnoses Final diagnoses:  Fatigue, unspecified type  Atrial fibrillation, unspecified type [facility])    Rx / DC Orders ED Discharge Orders    None       Hayden Rasmussen, MD 06/14/19 2202

## 2019-06-14 NOTE — ED Triage Notes (Signed)
Pt here from home via [facility] for afib, pt reports she was here earlier this week for the same. Pt saw cardiologist yesterday and was advised to come to ER for admission. Pt c/o weakness, fatigue, increased HR w/ any activity, sob, and chest pressure. Pt is on metoprolol and eliquis. AOx4, vss.

## 2019-06-14 NOTE — H&P (Signed)
History and Physical    Jillian Eaton SPQ:[id number] DOB: August 14, 1950 DOA: 06/14/2019  PCP: [name], MD Consultants:  [facility] - cardiology; [facility] - pulmonology; Alvan Dame - orthopedics; Cruzita Lederer - endocrinology; Johnney Ou - nephrology; Magrinat - oncology Patient coming from:  Home - lives with husband (suffering from dementia); NOK: Daughter, Garnette Scheuermann, [phone]  Chief Complaint: Fatigue and unexplained weight loss  HPI: Jillian Eaton is a 69 y.o. female with medical history significant of SBO; HTN; fibromyalgia; obesity (BMI 32); PAF on Xarelto; and chronic systolic CHF presenting with fatigue and unexplained weight loss.  She reports since her knee surgery (3/9) maybe a week later she started having breathing problems.  She had difficulty doing exercises due to SOB.  She saw her PCP and had blood work.  He thought maybe it was asthma and she needed a breathing treatment (but she hasn't needed that since she was 14).  Went to hematology, no significant findings.  She went to the ER and gave up after 6 hours on 4/12.  She returned to the ER on 4/15; she again had blood work and Insurance account manager and said that she was in afib and maybe that was the problem but she didn't need admission.  These were the same symptoms she had when she was admitted in October for CHF exacerbation and after that she "got amazingly better, whatever they did."  This week, she saw her pulmonologist (continue Breo and stop pain meds - Dilaudid for her knee) and cardiologist.  Cards was very concerned because she was so winded in walking short distances.  Her WBC count was high, so there were concerned about infection; she doesn't feel like she has infection.  They called her this morning and said if she felt better she should come to the ER.     ED Course:   1 month of fatigue, nausea, weight loss, elevated HR with minimal exertion.  Saw pulm and cards this week.  ?worsening cardiomyopathy, echo for Monday.   Unremarkable labs, CXR.  Dr. Margaretann Loveless will consult, think not cardiac.  TSH 7 yesterday, T3/T4 pending.  Review of Systems: As per HPI; otherwise review of systems reviewed and negative.   - frequent headaches, early satiety/bloating, dysphagia, orthopnea, PND, N/W/T + anorexia, chest tightness, unintentional weight loss (17 in the last month), n/v/d, balance issues, DOE, tachycardia with minimal exertion   Ambulatory Status:  Ambulates with a cane  COVID Vaccine Status:   Complete  Past Medical History:  Diagnosis Date  . Anemia    iron and pernicious  . Arthritis   . Asthma    flare up last yr  . CHF (congestive heart failure) ([place])   . Chronic headache    " better now "   . Complication of anesthesia    BP dropped   . DOE (dyspnea on exertion)    2D ECHO, 02/12/2012 - EF 60-65%, moderate concentric hypertrophy  . Dysrhythmia   . Fibromyalgia    nerve pain"left side at waist level" "can't lay on that side without pain" , "HOB elevation helps"; pt. thinks this has resolved after 200lb weight loss9-17- since i lost the wieght , i dont  think i have this anymore   . Heart murmur   . Hematuria - cause not known    resolved   . History of kidney stones    x 2 '13, '14 surgery to remove  . Hypertension   . Pneumonia    aspiration  04/2016  .  SBO (small bowel obstruction) ([place])    rqueired admission 2020  . Thyroid disease    "goiter"  . Transfusion history    10 yrs+    Past Surgical History:  Procedure Laterality Date  . ABDOMINAL ADHESION SURGERY  2004   open LOA - in [place]  . CARDIAC CATHETERIZATION  04/04/2010   No significant obstructive coronary artery disease  . CARDIOVERSION N/A 08/08/2017   Procedure: CARDIOVERSION;  Surgeon: Pixie Casino, MD;  Location: [facility];  Service: Cardiovascular;  Laterality: N/A;  . CHOLECYSTECTOMY  1990  . COLONOSCOPY W/ POLYPECTOMY    . COLONOSCOPY WITH PROPOFOL N/A 04/10/2014   Procedure: COLONOSCOPY WITH PROPOFOL;  Surgeon:  Beryle Beams, MD;  Location: [facility];  Service: Endoscopy;  Laterality: N/A;  . EYE SURGERY     lasik 20-25 yrs ago  . GASTROPLASTY VERTICAL BANDED  1985  . [place]   In [place] for SBO  . REVERSE SHOULDER ARTHROPLASTY Right 03/29/2018   Procedure: REVERSE SHOULDER ARTHROPLASTY;  Surgeon: Netta Cedars, MD;  Location: [facility];  Service: Orthopedics;  Laterality: Right;  . ROUX-EN-Y GASTRIC BYPASS  1992   Conversion VBG to RnYGB in [place], [place]  . SHOULDER ARTHROSCOPY WITH SUBACROMIAL DECOMPRESSION  2016   Dr Berenice Primas, [place], [place]  . SMALL INTESTINE SURGERY  2000   SBO - LOA w SB resection  . TOTAL KNEE ARTHROPLASTY Left 11/14/2018   Procedure: TOTAL KNEE ARTHROPLASTY;  Surgeon: Paralee Cancel, MD;  Location: [facility];  Service: Orthopedics;  Laterality: Left;  70 mins  . TOTAL KNEE ARTHROPLASTY Right 04/29/2019   Procedure: TOTAL KNEE ARTHROPLASTY;  Surgeon: Paralee Cancel, MD;  Location: [facility];  Service: Orthopedics;  Laterality: Right;  70 mins  . TOTAL THYROIDECTOMY  06/26/2012   Dr Celine Ahr, [facility]  . TUBAL LIGATION  1986  . URETHRAL DILATION  2012   w cystoscopy.  Dr Gaynelle Arabian  . UTERINE FIBROID EMBOLIZATION      Social History   Socioeconomic History  . Marital status: Married    Spouse name: Not on file  . Number of children: 3  . Years of education: Not on file  . Highest education level: Master's degree (e.g., MA, MS, MEng, MEd, MSW, MBA)  Occupational History  . Occupation: retired  Tobacco Use  . Smoking status: Never Smoker  . Smokeless tobacco: Never Used  Substance and Sexual Activity  . Alcohol use: No    Comment: wine occ  . Drug use: No    Types: Oxycodone    Comment: perscribed  . Sexual activity: Not Currently    Birth control/protection: None  Other Topics Concern  . Not on file  Social History Narrative   Right handed   1 cup of caffeine per day        Social Determinants of Health    Financial Resource Strain:   . Difficulty of Paying Living Expenses:   Food Insecurity:   . Worried About Charity fundraiser in the Last Year:   . Arboriculturist in the Last Year:   Transportation Needs:   . Film/video editor (Medical):   Marland Kitchen Lack of Transportation (Non-Medical):   Physical Activity:   . Days of Exercise per Week:   . Minutes of Exercise per Session:   Stress:   . Feeling of Stress :   Social Connections:   . Frequency of Communication with Friends and Family:   . Frequency  of Social Gatherings with Friends and Family:   . Attends Religious Services:   . Active Member of Clubs or Organizations:   . Attends Archivist Meetings:   Marland Kitchen Marital Status:   Intimate Partner Violence:   . Fear of Current or Ex-Partner:   . Emotionally Abused:   Marland Kitchen Physically Abused:   . Sexually Abused:     Allergies  Allergen Reactions  . Hydrocodone Itching         Family History  Problem Relation Age of Onset  . Diabetes Mother   . Epilepsy Mother   . Cancer Mother        Breast  . Hypertension Mother   . Breast cancer Mother   . Seizures Mother   . Kidney disease Father   . Diabetes Father   . Hypertension Father   . Asthma Father   . Heart disease Father   . Epilepsy Sister   . Cancer Maternal Grandmother   . Breast cancer Maternal Grandmother   . Cancer Paternal Grandmother   . Breast cancer Paternal Grandmother     Prior to Admission medications   Medication Sig Start Date End Date Taking? Authorizing Provider  acetaminophen (TYLENOL) 500 MG tablet Take 2 tablets (1,000 mg total) by mouth every 8 (eight) hours. Patient taking differently: Take 1,000 mg by mouth every 6 (six) hours as needed for moderate pain.  04/30/19   Danae Orleans, PA-C  calcitRIOL (ROCALTROL) 0.5 MCG capsule Take 0.5 mcg by mouth in the morning and at bedtime.     [provider]  clotrimazole (LOTRIMIN) 1 % cream APPLY TO AFFECTED AREA TWICE A DAY Patient  taking differently: Apply 1 application topically daily as needed (itching).  02/07/19   Marzetta Board, DPM  cyanocobalamin (,VITAMIN B-12,) 1000 MCG/ML injection Inject 1 mL (1,000 mcg total) into the muscle every 30 (thirty) days. 04/05/18   Hennie Duos, MD  cyclobenzaprine (FLEXERIL) 10 MG tablet Take 2 tablets (20 mg total) by mouth 2 (two) times daily. Patient taking differently: Take 10 mg by mouth daily.  04/05/18   Hennie Duos, MD  DULoxetine (CYMBALTA) 60 MG capsule Take 1 capsule (60 mg total) by mouth daily. 04/05/18   Hennie Duos, MD  ELIQUIS 5 MG TABS tablet TAKE 1 TABLET TWICE A DAY Patient taking differently: Take 5 mg by mouth 2 (two) times daily.  02/05/19   Hilty, Nadean Corwin, MD  eszopiclone (LUNESTA) 2 MG TABS tablet Take 1 tablet (2 mg total) by mouth at bedtime. 04/05/18   Hennie Duos, MD  ferrous sulfate (FERROUSUL) 325 (65 FE) MG tablet Take 1 tablet (325 mg total) by mouth 3 (three) times daily with meals for 14 days. Patient taking differently: Take 325 mg by mouth 2 (two) times daily with a meal.  04/30/19 06/13/19  Babish, Rodman Key, PA-C  fluticasone furoate-vilanterol (BREO ELLIPTA) 100-25 MCG/INH AEPB Inhale 1 puff into the lungs daily. 03/14/19   Lauraine Rinne, NP  furosemide (LASIX) 20 MG tablet Take 1 tablet (20 mg total) by mouth as needed. 01/24/19   Hilty, Nadean Corwin, MD  levETIRAcetam (KEPPRA) 500 MG tablet Take 1 tablet (500 mg total) by mouth 2 (two) times daily. Patient taking differently: Take 500 mg by mouth daily.  10/02/18   Suzzanne Cloud, NP  levothyroxine (SYNTHROID) 150 MCG tablet TAKE 1 TABLET BY MOUTH EVERY DAY Patient taking differently: Take 150 mcg by mouth daily before breakfast.  04/07/19  Philemon Kingdom, MD  magnesium oxide (MAG-OX) 400 (241.3 Mg) MG tablet Take 1 tablet by mouth daily. 04/29/19   [provider]  metoprolol succinate (TOPROL-XL) 25 MG 24 hr tablet Take 25 mg by mouth 2 (two) times daily.    [provider]  NONFORMULARY OR COMPOUNDED ITEM [facility]:  Antifungal Cream - Terbinafine 3%, Fluconazole 2%, Tea Tree Oil 5%, Urea 10%, Ibuprofen 2% in DMSO Suspension #69m. Apply to affected toenail(s) once daily (at bedtime) or twice daily. Patient taking differently: Apply 1 application topically at bedtime. [facility]:  Antifungal Cream - Terbinafine 3%, Fluconazole 2%, Tea Tree Oil 5%, Urea 10%, Ibuprofen 2% in DMSO Suspension #341m Apply to affected toenail(s) once daily (at bedtime) or twice daily. 05/07/18   GaMarzetta BoardDPM  traZODone (DESYREL) 50 MG tablet Take 1 tablet (50 mg total) by mouth at bedtime. 04/05/18   AlHennie DuosMD  Vitamin D, Ergocalciferol, (DRISDOL) 1.25 MG (50000 UT) CAPS capsule Take 1 capsule (50,000 Units total) by mouth every Wednesday. 04/10/18   AlHennie DuosMD    Physical Exam: Vitals:   06/14/19 1525 06/14/19 1545 06/14/19 1600 06/14/19 1615  BP: 108/80 104/77 110/77 112/72  Pulse: 68 82 79 82  Resp: 20 (!) 23 (!) 22 20  Temp:      TempSrc:      SpO2: 94% 100% 98% 97%     . General:  Appears calm and comfortable and is NAD; flat affect . Eyes:  PERRL, EOMI, normal lids, iris . ENT:  grossly normal hearing, lips & tongue, mmm; appropriate dentition . Neck:  no LAD, masses or thyromegaly . Cardiovascular:  Irregularly irregular with normal rate, no m/r/g. No LE edema.  . Marland Kitchenespiratory:   CTA bilaterally with no wheezes/rales/rhonchi.  Normal respiratory effort. . Abdomen:  soft, NT, ND, NABS . Back:   normal alignment, no CVAT . Skin:  no rash or induration seen on limited exam . Musculoskeletal:  grossly normal tone BUE/BLE, good ROM, no bony abnormality . Psychiatric:  flat mood and affect, speech fluent and appropriate, AOx3 Neurologic:  CN 2-12 grossly intact, moves all extremities in coordinated fashion   Radiological Exams on Admission: DG Chest Port 1 View  Result Date: 06/14/2019 CLINICAL DATA:   Cough. EXAM: PORTABLE CHEST 1 VIEW COMPARISON:  June 05, 2019 FINDINGS: Stable cardiomegaly. The hila and mediastinum are unchanged. No pneumothorax. No nodules or masses. No focal infiltrates or overt edema. IMPRESSION: No active disease. Electronically Signed   By: DaDorise BullionII M.D   On: 06/14/2019 14:50    EKG: Independently reviewed.  Afib with rate 104; no evidence of acute ischemia   Labs on Admission: I have personally reviewed the available labs and imaging studies at the time of the admission.  Pertinent labs:   BUN 17/Creatinine 1.23/GFR 53 - stable BNP 141.0 HS troponin 3 WBC 7.0 Hgb 9.7 - stable INR 1.6 TSH 4/23 7.71   Assessment/Plan Principal Problem:   Fatigue Active Problems:   Obesity (BMI 30-39.9)   Essential hypertension   Seizures (HCC)   Chronic atrial fibrillation (HCC)   Chronic pain syndrome   Depressive disorder   Chronic diastolic CHF (congestive heart failure) (HCC)   Hypothyroidism   Fatigue -Patient with h/o systolic CHF, PAF, and fibromyalgia presenting with fatigue, DOE, and unintentional weight loss -Work up has been overall unremarkable - her TSH is elevated, and so perhaps she has symptomatic hypothyroidism but then she wouldn't be  expected to lose weight -Recurrent CHF and Takotsubo cardiomyopathy are considerations -She has a tremendous amount of life stress - she is the caregiver for her husband, who has advanced dementia; and they had to rehome their dog this weekend and send her husband away with family in order for her to be able to do this - certainly emotional distress can negatively contribute to the overall physical condition -She reports that this is similar to how she felt when she was admitted in 11/2018; at that time, she had acute on chronic diastolic CHF and pulmonary HTN.  She does not apparent fluid overload on exam or CXR today, but certainly worsening pulmonary HTN is a consideration. -Will observe for now on  telemetry -Will order repeat echo  -Cardiology is consulting -Will also order CT C/A/P to ensure that there is no significant underlying pathology contributing to her array of symptoms (see ROS) -Will also check ESR and CRP  Chronic afib/chronic diastolic CHF -Rate controlled with Toprol (see below) -Eliquis for afib -No obvious evidence of volume overload -She is having increased rate with ambulation and so ablation may need to be considered if unable to maintain rate control otherwise -Echo ordered, consider Takotsubo  Chronic pain -She was taken off Dilaudid in case this was contributing to her symptoms -She is taking Flexeril 20 mg once daily; this is a very unusual dose -Will change Flexeril to 5 mg PO TID prn  Depression -As noted above, she has significant life stressors that may be a factor in today's presentation -Continue Cymbalta -Formulary substitution of Amben for Lunesta; she reports poor sleep associated with her husband getting up at various times throughout the night -Continue Trazodone, consider increasing dose but this likely is suboptimal if she will need to be able to get up with her husband overnight  HTN -She is taking Toprol XL BID, but it is usually a once daily medication -This medication can cause fatigue -Will change to 25 mg once daily and follow BP/HR  Hypothyroidism -Recent elevated TSH  Obesity -s/p gastric bypass - meaning that vitamin deficiency is a consideration - but anemia studies on 3/17 were reassuring -Weight today is 92.9 kg -She weighed 98.9 kg on 3/23; 104 kg on 2/10; 103.9 kg on 01/24/19 -She reports that she is not trying to lose weight -Evaluating for unexplained weight loss, as above  Seizures -Continue Keppra  Stage 3a CKD -Appears to be stable at this time -Continue Calcitriol  COPD -Continue Breo and add Albuterol -Check ambulatory pulse ox ("walk of life") tomorrow AM   Note: This patient has been tested and is  negative for the novel coronavirus COVID-19.  DVT prophylaxis:  Eliquis Code Status:  Full - confirmed with patient Family Communication: None present. Disposition Plan:  The patient is from: home  Anticipated d/c is to: home without [facility] services  Anticipated d/c date will depend on clinical response to treatment, but possibly as early as tomorrow if she has excellent response to treatment  Patient is currently: acutely ill Consults called: Cardiology; PT/OT Admission status:  It is my clinical opinion that referral for OBSERVATION is reasonable and necessary in this patient based on the above information provided. The aforementioned taken together are felt to place the patient at high risk for further clinical deterioration. However it is anticipated that the patient may be medically stable for discharge from the hospital within 24 to 48 hours.    Karmen Bongo MD [facility]   How to contact the  [facility] Attending or Consulting provider [place] or covering provider during after hours [place], for this patient?  1. Check the care team in [facility] and look for a) attending/consulting [facility] provider listed and b) the [facility] team listed 2. Log into [email or web address] and use [place]'s universal password to access. If you do not have the password, please contact the hospital operator. 3. Locate the [facility] provider you are looking for under [facility] and page to a number that you can be directly reached. 4. If you still have difficulty reaching the provider, please page the [facility] (Director on Call) for the Hospitalists listed on amion for assistance.   06/14/2019, 5:36 PM

## 2019-06-15 ENCOUNTER — Observation Stay (HOSPITAL_COMMUNITY): Payer: Medicare Other

## 2019-06-15 ENCOUNTER — Inpatient Hospital Stay (HOSPITAL_COMMUNITY): Payer: Medicare Other

## 2019-06-15 DIAGNOSIS — I4811 Longstanding persistent atrial fibrillation: Secondary | ICD-10-CM | POA: Diagnosis not present

## 2019-06-15 DIAGNOSIS — D509 Iron deficiency anemia, unspecified: Secondary | ICD-10-CM | POA: Diagnosis present

## 2019-06-15 DIAGNOSIS — I340 Nonrheumatic mitral (valve) insufficiency: Secondary | ICD-10-CM | POA: Diagnosis not present

## 2019-06-15 DIAGNOSIS — I5032 Chronic diastolic (congestive) heart failure: Secondary | ICD-10-CM | POA: Diagnosis not present

## 2019-06-15 DIAGNOSIS — D508 Other iron deficiency anemias: Secondary | ICD-10-CM | POA: Diagnosis not present

## 2019-06-15 DIAGNOSIS — Z8249 Family history of ischemic heart disease and other diseases of the circulatory system: Secondary | ICD-10-CM | POA: Diagnosis not present

## 2019-06-15 DIAGNOSIS — G40909 Epilepsy, unspecified, not intractable, without status epilepticus: Secondary | ICD-10-CM | POA: Diagnosis present

## 2019-06-15 DIAGNOSIS — E890 Postprocedural hypothyroidism: Secondary | ICD-10-CM | POA: Diagnosis present

## 2019-06-15 DIAGNOSIS — E039 Hypothyroidism, unspecified: Secondary | ICD-10-CM | POA: Diagnosis not present

## 2019-06-15 DIAGNOSIS — I361 Nonrheumatic tricuspid (valve) insufficiency: Secondary | ICD-10-CM | POA: Diagnosis not present

## 2019-06-15 DIAGNOSIS — Z96653 Presence of artificial knee joint, bilateral: Secondary | ICD-10-CM | POA: Diagnosis present

## 2019-06-15 DIAGNOSIS — E669 Obesity, unspecified: Secondary | ICD-10-CM

## 2019-06-15 DIAGNOSIS — G894 Chronic pain syndrome: Secondary | ICD-10-CM | POA: Diagnosis present

## 2019-06-15 DIAGNOSIS — Z79899 Other long term (current) drug therapy: Secondary | ICD-10-CM | POA: Diagnosis not present

## 2019-06-15 DIAGNOSIS — Z7901 Long term (current) use of anticoagulants: Secondary | ICD-10-CM | POA: Diagnosis not present

## 2019-06-15 DIAGNOSIS — Z7951 Long term (current) use of inhaled steroids: Secondary | ICD-10-CM | POA: Diagnosis not present

## 2019-06-15 DIAGNOSIS — R5383 Other fatigue: Secondary | ICD-10-CM | POA: Diagnosis not present

## 2019-06-15 DIAGNOSIS — N1831 Chronic kidney disease, stage 3a: Secondary | ICD-10-CM | POA: Diagnosis present

## 2019-06-15 DIAGNOSIS — F329 Major depressive disorder, single episode, unspecified: Secondary | ICD-10-CM | POA: Diagnosis present

## 2019-06-15 DIAGNOSIS — I4891 Unspecified atrial fibrillation: Secondary | ICD-10-CM | POA: Diagnosis not present

## 2019-06-15 DIAGNOSIS — R569 Unspecified convulsions: Secondary | ICD-10-CM | POA: Diagnosis not present

## 2019-06-15 DIAGNOSIS — Z9884 Bariatric surgery status: Secondary | ICD-10-CM | POA: Diagnosis not present

## 2019-06-15 DIAGNOSIS — M797 Fibromyalgia: Secondary | ICD-10-CM | POA: Diagnosis present

## 2019-06-15 DIAGNOSIS — Z6831 Body mass index (BMI) 31.0-31.9, adult: Secondary | ICD-10-CM | POA: Diagnosis not present

## 2019-06-15 DIAGNOSIS — Z20822 Contact with and (suspected) exposure to covid-19: Secondary | ICD-10-CM | POA: Diagnosis present

## 2019-06-15 DIAGNOSIS — J449 Chronic obstructive pulmonary disease, unspecified: Secondary | ICD-10-CM | POA: Diagnosis present

## 2019-06-15 DIAGNOSIS — I130 Hypertensive heart and chronic kidney disease with heart failure and stage 1 through stage 4 chronic kidney disease, or unspecified chronic kidney disease: Secondary | ICD-10-CM | POA: Diagnosis present

## 2019-06-15 DIAGNOSIS — I4819 Other persistent atrial fibrillation: Secondary | ICD-10-CM | POA: Diagnosis present

## 2019-06-15 DIAGNOSIS — I5042 Chronic combined systolic (congestive) and diastolic (congestive) heart failure: Secondary | ICD-10-CM | POA: Diagnosis present

## 2019-06-15 DIAGNOSIS — I2721 Secondary pulmonary arterial hypertension: Secondary | ICD-10-CM | POA: Diagnosis present

## 2019-06-15 DIAGNOSIS — Z79890 Hormone replacement therapy (postmenopausal): Secondary | ICD-10-CM | POA: Diagnosis not present

## 2019-06-15 DIAGNOSIS — D631 Anemia in chronic kidney disease: Secondary | ICD-10-CM | POA: Diagnosis present

## 2019-06-15 DIAGNOSIS — D638 Anemia in other chronic diseases classified elsewhere: Secondary | ICD-10-CM | POA: Diagnosis not present

## 2019-06-15 DIAGNOSIS — I482 Chronic atrial fibrillation, unspecified: Secondary | ICD-10-CM | POA: Diagnosis not present

## 2019-06-15 DIAGNOSIS — Z950 Presence of cardiac pacemaker: Secondary | ICD-10-CM | POA: Diagnosis not present

## 2019-06-15 DIAGNOSIS — I272 Pulmonary hypertension, unspecified: Secondary | ICD-10-CM | POA: Diagnosis not present

## 2019-06-15 DIAGNOSIS — J4540 Moderate persistent asthma, uncomplicated: Secondary | ICD-10-CM | POA: Diagnosis present

## 2019-06-15 LAB — BASIC METABOLIC PANEL
Anion gap: 8 (ref 5–15)
BUN: 11 mg/dL (ref 8–23)
CO2: 26 mmol/L (ref 22–32)
Calcium: 8.1 mg/dL — ABNORMAL LOW (ref 8.9–10.3)
Chloride: 103 mmol/L (ref 98–111)
Creatinine, Ser: 1.26 mg/dL — ABNORMAL HIGH (ref 0.44–1.00)
GFR calc Af Amer: 51 mL/min — ABNORMAL LOW (ref >60)
GFR calc non Af Amer: 44 mL/min — ABNORMAL LOW (ref >60)
Glucose, Bld: 66 mg/dL — ABNORMAL LOW (ref 70–99)
Potassium: 4 mmol/L (ref 3.5–5.1)
Sodium: 137 mmol/L (ref 135–145)

## 2019-06-15 LAB — CBC
HCT: 27.4 % — ABNORMAL LOW (ref 36.0–46.0)
Hemoglobin: 8.4 g/dL — ABNORMAL LOW (ref 12.0–15.0)
MCH: 27.7 pg (ref 26.0–34.0)
MCHC: 30.7 g/dL (ref 30.0–36.0)
MCV: 90.4 fL (ref 80.0–100.0)
Platelets: 276 10*3/uL (ref 150–400)
RBC: 3.03 MIL/uL — ABNORMAL LOW (ref 3.87–5.11)
RDW: 16 % — ABNORMAL HIGH (ref 11.5–15.5)
WBC: 6.5 10*3/uL (ref 4.0–10.5)
nRBC: 0 % (ref 0.0–0.2)

## 2019-06-15 LAB — ECHOCARDIOGRAM COMPLETE: Height: 67 in

## 2019-06-15 MED ORDER — DIGOXIN 125 MCG PO TABS
0.2500 mg | ORAL_TABLET | Freq: Once | ORAL | Status: AC
  Administered 2019-06-15: 0.25 mg via ORAL
  Filled 2019-06-15: qty 2

## 2019-06-15 NOTE — ED Notes (Signed)
Pt ambulated to the bathroom w/ standby assistance only

## 2019-06-15 NOTE — Progress Notes (Signed)
Progress Note  Patient Name: Jillian Eaton [facility] Date of Encounter: 06/15/2019  Primary Cardiologist: Pixie Casino, MD   Subjective   Feels fine at rest, becomes easily "exhausted" and very short of breath with minimal activity including walking to the bathroom. At rest, atrial fibrillation with ventricular rates in the 70s-80s, increased to 140s with physical activity.  Inpatient Medications    Scheduled Meds: . apixaban  5 mg Oral BID  . calcitRIOL  0.5 mcg Oral BID  . digoxin  0.125 mg Oral Daily  . docusate sodium  100 mg Oral BID  . DULoxetine  60 mg Oral Daily  . ferrous sulfate  325 mg Oral BID WC  . fluticasone furoate-vilanterol  1 puff Inhalation Daily  . levETIRAcetam  500 mg Oral Daily  . levothyroxine  150 mcg Oral Q0600  . magnesium oxide  400 mg Oral Daily  . metoprolol succinate  25 mg Oral Daily  . sodium chloride flush  3 mL Intravenous Q12H  . traZODone  50 mg Oral QHS  . zolpidem  5 mg Oral QHS   Continuous Infusions:  PRN Meds: acetaminophen **OR** acetaminophen, albuterol, bisacodyl, cyclobenzaprine, hydrALAZINE, morphine injection, ondansetron **OR** ondansetron (ZOFRAN) IV, polyethylene glycol, zolpidem   Vital Signs    Vitals:   06/15/19 1215 06/15/19 1230 06/15/19 1245 06/15/19 1321  BP:   100/72 113/79  Pulse: 73 73 70 73  Resp:  (!) 22  20  Temp:    97.8 F (36.6 C)  TempSrc:    Oral  SpO2: 100% 100% 100% 99%   No intake or output data in the 24 hours ending 06/15/19 1414 Last 3 Weights 06/13/2019 06/05/2019 05/15/2019  Weight (lbs) 204 lb 12.8 oz 203 lb 217 lb 6.4 oz  Weight (kg) 92.897 kg 92.08 kg 98.612 kg      Telemetry    Atrial fibrillation with controlled ventricular response- Personally Reviewed  ECG    No new tracing- Personally Reviewed  Physical Exam  Obese GEN: No acute distress.   Neck: No JVD Cardiac: RRR, no murmurs, rubs, or gallops.  Respiratory: Clear to auscultation bilaterally. GI: Soft,  nontender, non-distended  MS: No edema; No deformity. Neuro:  Nonfocal  Psych: Normal affect   Labs    High Sensitivity Troponin:   Recent Labs  Lab 06/14/19 1430 06/14/19 2049  TROPONINIHS 3 <2      Chemistry Recent Labs  Lab 06/13/19 1429 06/14/19 1430 06/15/19 0640  NA 137 138 137  K 4.1 4.3 4.0  CL 99 101 103  CO2 29 25 26   GLUCOSE 82 82 66*  BUN 15 17 11   CREATININE 1.25* 1.23* 1.26*  CALCIUM 8.3* 8.3* 8.1*  GFRNONAA  --  45* 44*  GFRAA  --  53* 51*  ANIONGAP  --  12 8     Hematology Recent Labs  Lab 06/13/19 1639 06/14/19 1430 06/15/19 0640  WBC 8.9 7.0 6.5  RBC 4.03 3.51* 3.03*  HGB 11.2 9.7* 8.4*  HCT 36.9 32.0* 27.4*  MCV 92 91.2 90.4  MCH 27.8 27.6 27.7  MCHC 30.4* 30.3 30.7  RDW 14.9 15.9* 16.0*  PLT 360 320 276    BNP Recent Labs  Lab 06/13/19 1429 06/14/19 1430  BNP  --  141.0*  PROBNP 251.0*  --      DDimer No results for input(s): DDIMER in the last 168 hours.   Radiology    CT CHEST W CONTRAST  Result Date: 06/14/2019 CLINICAL DATA:  Nausea, vomiting and shortness of breath. EXAM: CT CHEST, ABDOMEN, AND PELVIS WITH CONTRAST TECHNIQUE: Multidetector CT imaging of the chest, abdomen and pelvis was performed following the standard protocol during bolus administration of intravenous contrast. CONTRAST:  123m OMNIPAQUE IOHEXOL 300 MG/ML  SOLN COMPARISON:  None. FINDINGS: CT CHEST FINDINGS Cardiovascular: No intraluminal filling defects are seen within the pulmonary arteries. The pulmonary inflow tract measures approximately 3.8 cm while the right pulmonary artery measures 3.8 cm. Normal heart size. No pericardial effusion. Mediastinum/Nodes: No enlarged mediastinal, hilar, or axillary lymph nodes. Thyroid gland, trachea, and esophagus demonstrate no significant findings. Lungs/Pleura: Mild linear scarring and/or atelectasis is seen within the right lower lobe. There is no evidence of a pleural effusion or pneumothorax. Musculoskeletal: A  right shoulder prosthesis is seen with associated streak artifact. Multilevel degenerative changes seen throughout the thoracic spine. CT ABDOMEN PELVIS FINDINGS Hepatobiliary: No focal liver abnormality is seen. Status post cholecystectomy. No biliary dilatation. Pancreas: Unremarkable. No pancreatic ductal dilatation or surrounding inflammatory changes. Spleen: Normal in size without focal abnormality. Adrenals/Urinary Tract: Adrenal glands are unremarkable. There is mild asymmetric enlargement of the right kidney. The left kidney is normal in size. There is marked severity right-sided hydronephrosis and hydroureter with delayed renal cortical enhancement seen throughout the right kidney. A 9 mm obstructing renal stone is seen in the distal right ureter, with additional 7 mm obstructing renal stone seen at the right UVJ. Bladder is unremarkable. Stomach/Bowel: Multiple surgical clips and surgical sutures are seen within the gastric region. There is a small hiatal hernia. Surgically anastomosed bowel is seen within the anterior aspect of the right upper quadrant. Appendix appears normal. No evidence of bowel wall thickening, distention, or inflammatory changes. Vascular/Lymphatic: No significant vascular findings are present. No enlarged abdominal or pelvic lymph nodes. Reproductive: Multiple coarse calcifications of various sizes are seen scattered throughout the uterus. Other: No abdominal wall hernia or abnormality. No abdominopelvic ascites. Musculoskeletal: Multilevel degenerative changes are seen throughout the lumbar spine. IMPRESSION: 1. 9 mm obstructing renal stone in the distal right ureter, with additional 7 mm obstructing renal stone seen at the right UVJ. 2. Evidence of prior gastric bypass surgery. 3. Small hiatal hernia. 4. No acute or active cardiopulmonary disease. Electronically Signed   By: TVirgina NorfolkM.D.   On: 06/14/2019 19:33   CT ABDOMEN PELVIS W CONTRAST  Result Date:  06/14/2019 CLINICAL DATA:  Weakness, fatigue and increased heart rate. EXAM: CT CHEST, ABDOMEN, AND PELVIS WITH CONTRAST TECHNIQUE: Multidetector CT imaging of the chest, abdomen and pelvis was performed following the standard protocol during bolus administration of intravenous contrast. CONTRAST:  1049mOMNIPAQUE IOHEXOL 300 MG/ML  SOLN COMPARISON:  January 04, 2019 FINDINGS: CT CHEST FINDINGS Cardiovascular: No intraluminal filling defects are seen within the pulmonary arteries. The pulmonary inflow tract measures approximately 3.8 cm while the right pulmonary artery measures 3.8 cm. Normal heart size. No pericardial effusion. Mediastinum/Nodes: Lungs/Pleura: Mild linear scarring and/or atelectasis is seen within the right lower lobe. There is no evidence of a pleural effusion or pneumothorax. Musculoskeletal: A right shoulder prosthesis is seen with associated streak artifact. Multilevel degenerative changes seen throughout the thoracic spine. CT ABDOMEN PELVIS FINDINGS Hepatobiliary: No focal liver abnormality is seen. Status post cholecystectomy. No biliary dilatation. Pancreas: Unremarkable. No pancreatic ductal dilatation or surrounding inflammatory changes. Spleen: Normal in size without focal abnormality. Adrenals/Urinary Tract: Adrenal glands are unremarkable. There is mild asymmetric enlargement of the right kidney. The left kidney is normal in size. There is  marked severity right-sided hydronephrosis and hydroureter with delayed renal cortical enhancement seen throughout the right kidney. A 9 mm obstructing renal stone is seen in the distal right ureter, with additional 7 mm obstructing renal stone seen at the right UVJ. Bladder is unremarkable. Stomach/Bowel: Multiple surgical clips and surgical sutures are seen within the gastric region. There is a small hiatal hernia. Surgically anastomosed bowel is seen within the anterior aspect of the right upper quadrant. Appendix appears normal. No evidence of  bowel wall thickening, distention, or inflammatory changes. Vascular/Lymphatic: No significant vascular findings are present. No enlarged abdominal or pelvic lymph nodes. Reproductive: Multiple coarse calcifications of various sizes are seen scattered throughout the uterus. Other: No abdominal wall hernia or abnormality. No abdominopelvic ascites. Musculoskeletal: Multilevel degenerative changes are seen throughout the lumbar spine. IMPRESSION: 1. 9 mm obstructing renal stone in the distal right ureter, with additional 7 mm obstructing renal stone seen at the right UVJ. 2. Evidence of prior gastric bypass surgery. 3. Small hiatal hernia. 4. No acute or active cardiopulmonary disease. Electronically Signed   By: Virgina Norfolk M.D.   On: 06/14/2019 19:31   DG Chest Port 1 View  Result Date: 06/14/2019 CLINICAL DATA:  Cough. EXAM: PORTABLE CHEST 1 VIEW COMPARISON:  June 05, 2019 FINDINGS: Stable cardiomegaly. The hila and mediastinum are unchanged. No pneumothorax. No nodules or masses. No focal infiltrates or overt edema. IMPRESSION: No active disease. Electronically Signed   By: Dorise Bullion III M.D   On: 06/14/2019 14:50    Cardiac Studies   12/08/2018 echocardiogram 1. Left ventricular ejection fraction, by visual estimation, is 55 to  60%. The left ventricle has normal function. Normal left ventricular size.  There is no left ventricular hypertrophy.  2. Left ventricular diastolic function could not be evaluated pattern of  LV diastolic filling.  3. Global right ventricle has normal systolic function.The right  ventricular size is mildly enlarged. No increase in right ventricular wall  thickness.  4. Left atrial size was severely dilated.  5. Right atrial size was severely dilated.  6. The mitral valve is normal in structure. Trace mitral valve  regurgitation.  7. The tricuspid valve is normal in structure. Tricuspid valve  regurgitation mild-moderate.  8. The aortic valve is  normal in structure. Aortic valve regurgitation is  trivial by color flow Doppler.  9. The pulmonic valve was grossly normal. Pulmonic valve regurgitation is  not visualized by color flow Doppler.  10. Normal pulmonary artery systolic pressure.  11. The inferior vena cava is normal in size with greater than 50%  respiratory variability, suggesting right atrial pressure of 3 mmHg.  TRICUSPID VALVE      TR Peak grad:  24.8 mmHg       TR Vmax:    249.00 cm/s    Patient Profile     69 y.o. female with permanent atrial fibrillation, history of chronic diastolic heart failure and mild pulmonary hypertension, severe obesity status post 3 separate gastric bypass surgeries, chronic iron deficiency anemia, mild CKD, history of moderate persistent asthma, admitted with extreme fatigue and dyspnea with activity.  Assessment & Plan  1.  Afib: Has excellent ventricular rate control at rest, but develops RVR with minimal physical activity, associated with fatigue and dyspnea.  Intolerant of higher doses of beta-blocker due to hypotension.  Digoxin added yesterday, but it will take time to see its full effect and it is unlikely to solve the problem of exertional tachycardia. 2. CHF:  Repeat echocardiogram  pending.  Unable to evaluate diastolic function by echocardiography due to the arrhythmia, but she was noted to have mildly elevated filling pressures at cardiac catheterization 2012 (LVEDP 20, mean PAWP 24, mean PAP 31).  In fact, her pulmonary artery hypertension could be attributed entirely to the elevated left heart pressures at that time (transpulmonary gradient 7 mmHg).  The mechanism of diastolic dysfunction is uncertain since she does not have significant left ventricular hypertrophy.  Obesity likely playing a role.  Had transient reduction in LVEF during acute illness in the past, but several echocardiograms performed since then have shown normal LVEF around 55-60%.  Currently not  receiving diuretics.  We may be compelled to repeat a right heart catheterization to clarify her hemodynamic status. 3. PAH: At least in part due to diastolic dysfunction, consider contribution of obesity. 4. Severe obesity with comorbid conditions: She has actually done an exceptional job of losing weight from super obesity to morbid obesity to currently only severely obese over a period of just 2 years. 5. Anemia:  I think this is the most likely cause of her symptoms.   The tachycardia with physical activity is appropriate, and especially in an obese patient with atrial fibrillation.  I do not think we will be able to achieve adequate ventricular rate control with activity until the anemia is corrected.  Recent labs show a transferrin saturation of only 10%, although ferritin is in normal range.  Normocytic erythrocyte parameters.  Normal hemoglobin electrophoresis in the past.  Previously checked vitamin B12 has been high. (She showed me labs from her PCP performed in March when her transferrin saturation was 14%, iron 40, hemoglobin 7.7). Her hemoglobin today has dropped from 9.4 admission, now 8.5.  It is apparent that she has severe iron absorption deficits following her multiple gastrointestinal procedures.  She may require iron infusion with increased frequency. She has seen Dr. Jana Hakim in the past and repeat consultation with hematology is appropriate.  During her previous evaluation, he suggested that [place] may be causing problems with bone marrow red cell production and suggested this medication be switched to something else.  Barring this leading to improvement in anemia, he recommended a bone marrow biopsy.  (See notes from March 2021).     For questions or updates, please contact [place] Please consult [email or web address] for contact info under        Signed, Sanda Klein, MD  06/15/2019, 2:14 PM

## 2019-06-15 NOTE — ED Notes (Signed)
Pt ambulated to bathroom with her own cane, no other assistance. Steady gait, no distress noted.

## 2019-06-15 NOTE — Evaluation (Signed)
Physical Therapy Evaluation Patient Details Name: Jillian Eaton [facility] MRN: [id number] DOB: 10-20-1950 Today's Date: 06/15/2019   History of Present Illness  69 y.o. female with medical history significant of SBO; HTN; fibromyalgia; obesity (BMI 32); PAF on Xarelto; and chronic systolic CHF presenting with fatigue and unexplained weight loss. Problem started after knee surgery on 3/9.  Clinical Impression  Pt presents to PT with deficits in ROM, activity tolerance, cardiopulmonary function, gait. Pt with significant tachycardia with HR elevated up to high 150s with ambulation and rhythm in A-fib. Tachycardia associated with reports of SOB during mobility, however SpO2 stable at or above 97%. Pt lacking 5-10 degrees AROM R knee extension at this time, PT instructs pt to continue HEP for r knee from outpatient PT while admitted to hospital. Pt will benefit from continued acute PT to improve activity tolerance and progress R knee ROM. PT recommending return to outpatient PT for R knee with possible transition to cardiac rehab if activity tolerance improves. PT also recommending pt receive a 4 wheeled walker with seat to aide in energy conservation.    Follow Up Recommendations Outpatient PT(OPPT for R knee, cardiac rehab if activity tolerance improve)    Equipment Recommendations  Other (comment)(4 wheeled walker with seat)    Recommendations for Other Services       Precautions / Restrictions Precautions Precautions: None Restrictions Weight Bearing Restrictions: No      Mobility  Bed Mobility Overal bed mobility: Needs Assistance Bed Mobility: Supine to Sit;Sit to Supine     Supine to sit: Supervision Sit to supine: Min assist   General bed mobility comments: minA for RLE management  Transfers Overall transfer level: Modified independent Equipment used: Straight cane                Ambulation/Gait Ambulation/Gait assistance: Modified independent (Device/Increase  time) Gait Distance (Feet): 100 Feet Assistive device: Straight cane Gait Pattern/deviations: Step-through pattern;Wide base of support Gait velocity: functional Gait velocity interpretation: 1.31 - 2.62 ft/sec, indicative of limited community ambulator General Gait Details: pt with shortened stride length, widened BOS, no significant LOB noted  Stairs            Wheelchair Mobility    Modified Rankin (Stroke Patients Only)       Balance Overall balance assessment: Mild deficits observed, not formally tested                                           Pertinent Vitals/Pain Pain Assessment: No/denies pain    Home Living Family/patient expects to be discharged to:: Private residence Living Arrangements: Spouse/significant other Available Help at Discharge: Family;Available 24 hours/day Type of Home: Apartment Home Access: Level entry;Elevator     Home Layout: One level Home Equipment: Walker - 2 wheels;Cane - single point;Shower seat;Grab bars - tub/shower;Grab bars - toilet Additional Comments: pt is caregiver for spouse, spouse requires cues/setup for tasks, no physical assistance    Prior Function Level of Independence: Independent with assistive device(s)         Comments: using cane for ambulation     Hand Dominance   Dominant Hand: Right    Extremity/Trunk Assessment   Upper Extremity Assessment Upper Extremity Assessment: Overall WFL for tasks assessed    Lower Extremity Assessment Lower Extremity Assessment: RLE deficits/detail RLE Deficits / Details: lacking ~5-10 degrees knee extension AROM    Cervical / Trunk  Assessment Cervical / Trunk Assessment: Normal  Communication   Communication: No difficulties  Cognition Arousal/Alertness: Awake/alert Behavior During Therapy: WFL for tasks assessed/performed Overall Cognitive Status: Within Functional Limits for tasks assessed                                         General Comments General comments (skin integrity, edema, etc.): pt tachy to 161 with A-fib rhythm during activity. Pt in A-fib in 80s at rest. Pt reports SOB with ambulation but SpO2 97% and above during activity. HR quickly recovers to 110s with sitting and rest.    Exercises     Assessment/Plan    PT Assessment Patient needs continued PT services  PT Problem List Decreased activity tolerance;Decreased range of motion;Cardiopulmonary status limiting activity       PT Treatment Interventions DME instruction;Gait training;Therapeutic exercise;Patient/family education;Manual techniques    PT Goals (Current goals can be found in the Care Plan section)  Acute Rehab PT Goals Patient Stated Goal: To improve activity tolerance and finish therapy for R knee PT Goal Formulation: With patient Time For Goal Achievement: 06/29/19 Potential to Achieve Goals: Good Additional Goals Additional Goal #1: Pt will maintain dynamic standing balance within 10 inches of her base of support without UE support independently    Frequency Min 3X/week   Barriers to discharge        Co-evaluation               AM-PAC PT "6 Clicks" Mobility  Outcome Measure Help needed turning from your back to your side while in a flat bed without using bedrails?: None Help needed moving from lying on your back to sitting on the side of a flat bed without using bedrails?: None Help needed moving to and from a bed to a chair (including a wheelchair)?: None Help needed standing up from a chair using your arms (e.g., wheelchair or bedside chair)?: None Help needed to walk in hospital room?: None Help needed climbing 3-5 steps with a railing? : A Lot 6 Click Score: 22    End of Session   Activity Tolerance: Patient tolerated treatment well Patient left: in bed;with call bell/phone within reach Nurse Communication: Mobility status PT Visit Diagnosis: Other abnormalities of gait and mobility (R26.89)     Time: [id number] PT Time Calculation (min) (ACUTE ONLY): 24 min   Charges:   PT Evaluation $PT Eval Moderate Complexity: 1 Mod          Zenaida Niece, PT, DPT Acute Rehabilitation Pager: [phone]   Zenaida Niece 06/15/2019, 10:26 AM

## 2019-06-15 NOTE — Progress Notes (Signed)
Jillian Eaton  [id number] DOB: [birth year] DOA: 06/14/2019 PCP: [name], MD    Brief Narrative:  69 year old with a history of SBO, HTN, fibromyalgia, obesity, and atrial fibrillation on Xarelto who presented with severe fatigue and unexplained weight loss.  She explains severe shortness of breath and fatigue dating back to March 9, following a knee surgery.  Previous work-ups via her PCP a hematologist and ER visits have been unrevealing.  Significant Events: 4/26 admit via [place]  Antimicrobials:  None  Subjective: Resting comfortably in bed at the time of my visit.  Denies chest pain or shortness of breath.  Reports continued severe fatigue with attempts to walk even across the room to the bathroom with accompanying tachycardia.  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 Fully recovered  Chronic persistent atrial fibrillation -severe biatrial dilatation Rate controlled with beta-blocker though dosing limited by propensity for hypotension -has failed multiple prior attempts at cardioversion -appears to be experiencing RVR with exertion -digoxin added by cardiology team -care per cardiology  Pulmonary artery hypertension Care per cardiology  Postsurgical hypothyroidism Given significant elevation of TSH this may also be contributing to her symptoms -target TSH in this patient should be 1.0 though this may prove to be problematic given her propensity for RVR -follow-up free T3 and T4 prior to making decision on adjusting Synthroid  Chronic iron  deficiency anemia Baseline hemoglobin approximately 9.5 -normal hemoglobin electrophoresis and previous work-ups -see discussion above  CKD stage III Creatinine appears stable  COPD Well compensated at present with no wheezing  Chronic pain -fibromyalgia  HTN Blood pressure currently well controlled  Morbid obesity  Status post gastric bypass -has reportedly lost approximately 200 pounds over the last 2 years  Seizure disorder Continue Keppra  Depression Continue home treatment regimen  DVT prophylaxis: Eliquis Code Status: FULL CODE Family Communication:  Disposition Plan:   Consultants:  Cardiology  Objective: Blood pressure 113/79, pulse 73, temperature 97.8 F (36.6 C), temperature source Oral, resp. rate 20, height 5\' 7"  (1.702 m), weight 91.3 kg, SpO2 99 %. No intake or output data in the 24 hours ending 06/15/19 1900 Filed Weights   06/15/19 1607  Weight: 91.3 kg    Examination: General: No acute respiratory distress Lungs: Clear to auscultation bilaterally without wheezes or crackles Cardiovascular: Regular rate without murmur  Abdomen: Nontender, nondistended, soft, bowel sounds positive, no rebound, no ascites, no appreciable mass Extremities: No significant edema bilateral lower extremities  CBC: Recent Labs  Lab 06/13/19 1639 06/14/19 1430 06/15/19 0640  WBC 8.9 7.0 6.5  NEUTROABS  --  5.4  --   HGB 11.2 9.7* 8.4*  HCT 36.9 32.0* 27.4*  MCV 92 91.2 90.4  PLT 360 320 [id number]   Basic Metabolic Panel: Recent Labs  Lab 06/13/19 1429 06/14/19 1430 06/15/19 0640  NA 137 138 137  K 4.1 4.3 4.0  CL 99 101 103  CO2 29 25 26   GLUCOSE 82 82 66*  BUN 15 17 11   CREATININE 1.25* 1.23* 1.26*  CALCIUM 8.3* 8.3* 8.1*   GFR: Estimated Creatinine Clearance: 50.3 mL/min (A) (by C-G  formula based on SCr of 1.26 mg/dL (H)).  Liver Function Tests: No results for input(s): AST, ALT, ALKPHOS, BILITOT, PROT, ALBUMIN in the last 168 hours. No results for  input(s): LIPASE, AMYLASE in the last 168 hours. No results for input(s): AMMONIA in the last 168 hours.  Coagulation Profile: Recent Labs  Lab 06/14/19 1430  INR 1.6*    Cardiac Enzymes: No results for input(s): CKTOTAL, CKMB, CKMBINDEX, TROPONINI in the last 168 hours.  HbA1C: Hgb A1c MFr Bld  Date/Time Value Ref Range Status  05/27/2018 05:43 AM 4.6 (L) 4.8 - 5.6 % Final    Comment:    (NOTE) Pre diabetes:          5.7%-6.4% Diabetes:              >6.4% Glycemic control for   <7.0% adults with diabetes     CBG: No results for input(s): GLUCAP in the last 168 hours.  Recent Results (from the past 240 hour(s))  SARS CORONAVIRUS 2 (TAT 6-24 HRS) Nasopharyngeal Nasopharyngeal Swab     Status: None   Collection Time: 06/05/19  8:06 PM   Specimen: Nasopharyngeal Swab  Result Value Ref Range Status   SARS Coronavirus 2 NEGATIVE NEGATIVE Final    Comment: (NOTE) SARS-CoV-2 target nucleic acids are NOT DETECTED. The SARS-CoV-2 RNA is generally detectable in upper and lower respiratory specimens during the acute phase of infection. Negative results do not preclude SARS-CoV-2 infection, do not rule out co-infections with other pathogens, and should not be used as the sole basis for treatment or other patient management decisions. Negative results must be combined with clinical observations, patient history, and epidemiological information. The expected result is Negative. Fact Sheet for Patients: SugarRoll.be Fact Sheet for Healthcare Providers: https://www.woods-mathews.com/ This test is not yet approved or cleared by the [place] FDA and  has been authorized for detection and/or diagnosis of SARS-CoV-2 by FDA under an Emergency Use Authorization (EUA). This EUA will remain  in effect (meaning this test can be used) for the duration of the COVID-19 declaration under Section 56 4(b)(1) of the Act, 21 U.S.C. section  360bbb-3(b)(1), unless the authorization is terminated or revoked sooner. Performed at [facility], [place] [address] [place],  [address]   Respiratory Panel by RT PCR (Flu A&B, Covid) - Nasopharyngeal Swab     Status: None   Collection Time: 06/14/19  8:49 PM   Specimen: Nasopharyngeal Swab  Result Value Ref Range Status   SARS Coronavirus 2 by RT PCR NEGATIVE NEGATIVE Final    Comment: (NOTE) SARS-CoV-2 target nucleic acids are NOT DETECTED. The SARS-CoV-2 RNA is generally detectable in upper respiratoy specimens during the acute phase of infection. The lowest concentration of SARS-CoV-2 viral copies this assay can detect is 131 copies/mL. A negative result does not preclude SARS-Cov-2 infection and should not be used as the sole basis for treatment or other patient management decisions. A negative result may occur with  improper specimen collection/handling, submission of specimen other than nasopharyngeal swab, presence of viral mutation(s) within the areas targeted by this assay, and inadequate number of viral copies (<131 copies/mL). A negative result must be combined with clinical observations, patient history, and epidemiological information. The expected result is Negative. Fact Sheet for Patients:  PinkCheek.be Fact Sheet for Healthcare Providers:  GravelBags.it This test is not yet ap proved or cleared by the [place] FDA and  has been authorized for detection and/or diagnosis of SARS-CoV-2 by FDA under an Emergency  Use Authorization (EUA). This EUA will remain  in effect (meaning this test can be used) for the duration of the COVID-19 declaration under Section 564(b)(1) of the Act, 21 U.S.C. section 360bbb-3(b)(1), unless the authorization is terminated or revoked sooner.    Influenza A by PCR NEGATIVE NEGATIVE Final   Influenza B by PCR NEGATIVE NEGATIVE Final    Comment: (NOTE) The Xpert  Xpress SARS-CoV-2/FLU/RSV assay is intended as an aid in  the diagnosis of influenza from Nasopharyngeal swab specimens and  should not be used as a sole basis for treatment. Nasal washings and  aspirates are unacceptable for Xpert Xpress SARS-CoV-2/FLU/RSV  testing. Fact Sheet for Patients: PinkCheek.be Fact Sheet for Healthcare Providers: GravelBags.it This test is not yet approved or cleared by the [place] FDA and  has been authorized for detection and/or diagnosis of SARS-CoV-2 by  FDA under an Emergency Use Authorization (EUA). This EUA will remain  in effect (meaning this test can be used) for the duration of the  Covid-19 declaration under Section 564(b)(1) of the Act, 21  U.S.C. section 360bbb-3(b)(1), unless the authorization is  terminated or revoked. Performed at [facility], [place] [address] [place],  [address]      Scheduled Meds: . apixaban  5 mg Oral BID  . calcitRIOL  0.5 mcg Oral BID  . digoxin  0.125 mg Oral Daily  . docusate sodium  100 mg Oral BID  . DULoxetine  60 mg Oral Daily  . ferrous sulfate  325 mg Oral BID WC  . fluticasone furoate-vilanterol  1 puff Inhalation Daily  . levETIRAcetam  500 mg Oral Daily  . levothyroxine  150 mcg Oral Q0600  . magnesium oxide  400 mg Oral Daily  . metoprolol succinate  25 mg Oral Daily  . sodium chloride flush  3 mL Intravenous Q12H  . traZODone  50 mg Oral QHS  . zolpidem  5 mg Oral QHS     LOS: 0 days   Cherene Altes, MD [facility] Office  [phone] Pager - Text Page per Amion  If 7PM-7AM, please contact night-coverage per Amion 06/15/2019, 7:00 PM

## 2019-06-15 NOTE — Progress Notes (Signed)
  Echocardiogram 2D Echocardiogram has been performed.  Merrie Roof F 06/15/2019, 4:24 PM

## 2019-06-16 ENCOUNTER — Telehealth: Payer: Self-pay | Admitting: *Deleted

## 2019-06-16 ENCOUNTER — Ambulatory Visit (HOSPITAL_COMMUNITY): Admission: RE | Admit: 2019-06-16 | Payer: Medicare Other | Source: Ambulatory Visit

## 2019-06-16 DIAGNOSIS — R5383 Other fatigue: Secondary | ICD-10-CM

## 2019-06-16 DIAGNOSIS — I4891 Unspecified atrial fibrillation: Secondary | ICD-10-CM

## 2019-06-16 LAB — PHOSPHORUS: Phosphorus: 3.2 mg/dL (ref 2.5–4.6)

## 2019-06-16 LAB — COMPREHENSIVE METABOLIC PANEL
ALT: 9 U/L (ref 0–44)
AST: 13 U/L — ABNORMAL LOW (ref 15–41)
Albumin: 2.1 g/dL — ABNORMAL LOW (ref 3.5–5.0)
Alkaline Phosphatase: 70 U/L (ref 38–126)
Anion gap: 9 (ref 5–15)
BUN: 9 mg/dL (ref 8–23)
CO2: 29 mmol/L (ref 22–32)
Calcium: 8.5 mg/dL — ABNORMAL LOW (ref 8.9–10.3)
Chloride: 102 mmol/L (ref 98–111)
Creatinine, Ser: 1.33 mg/dL — ABNORMAL HIGH (ref 0.44–1.00)
GFR calc Af Amer: 48 mL/min — ABNORMAL LOW (ref >60)
GFR calc non Af Amer: 41 mL/min — ABNORMAL LOW (ref >60)
Glucose, Bld: 68 mg/dL — ABNORMAL LOW (ref 70–99)
Potassium: 3.9 mmol/L (ref 3.5–5.1)
Sodium: 140 mmol/L (ref 135–145)
Total Bilirubin: 0.6 mg/dL (ref 0.3–1.2)
Total Protein: 6 g/dL — ABNORMAL LOW (ref 6.5–8.1)

## 2019-06-16 LAB — RETICULOCYTES
Immature Retic Fract: 11.1 % (ref 2.3–15.9)
RBC.: 4.08 MIL/uL (ref 3.87–5.11)
Retic Count, Absolute: 31.4 10*3/uL (ref 19.0–186.0)
Retic Ct Pct: 0.8 % (ref 0.4–3.1)

## 2019-06-16 LAB — FOLATE: Folate: 26 ng/mL (ref >5.9)

## 2019-06-16 LAB — CBC
HCT: 30.7 % — ABNORMAL LOW (ref 36.0–46.0)
Hemoglobin: 9.5 g/dL — ABNORMAL LOW (ref 12.0–15.0)
MCH: 28 pg (ref 26.0–34.0)
MCHC: 30.9 g/dL (ref 30.0–36.0)
MCV: 90.6 fL (ref 80.0–100.0)
Platelets: 321 10*3/uL (ref 150–400)
RBC: 3.39 MIL/uL — ABNORMAL LOW (ref 3.87–5.11)
RDW: 15.9 % — ABNORMAL HIGH (ref 11.5–15.5)
WBC: 4.3 10*3/uL (ref 4.0–10.5)
nRBC: 0 % (ref 0.0–0.2)

## 2019-06-16 LAB — FERRITIN: Ferritin: 47 ng/mL (ref 11–307)

## 2019-06-16 LAB — T4: T4, Total: 6.2 ug/dL (ref 4.5–12.0)

## 2019-06-16 LAB — VITAMIN B12: Vitamin B-12: 830 pg/mL (ref 180–914)

## 2019-06-16 LAB — CORTISOL: Cortisol, Plasma: 11.8 ug/dL

## 2019-06-16 LAB — IRON AND TIBC
Iron: 39 ug/dL (ref 28–170)
Saturation Ratios: 16 % (ref 10.4–31.8)
TIBC: 251 ug/dL (ref 250–450)
UIBC: 212 ug/dL

## 2019-06-16 LAB — VITAMIN D 25 HYDROXY (VIT D DEFICIENCY, FRACTURES): Vit D, 25-Hydroxy: 107.02 ng/mL — ABNORMAL HIGH (ref 30–100)

## 2019-06-16 LAB — LACTATE DEHYDROGENASE: LDH: 133 U/L (ref 98–192)

## 2019-06-16 LAB — MAGNESIUM: Magnesium: 1.8 mg/dL (ref 1.7–2.4)

## 2019-06-16 LAB — T3, FREE: T3, Free: 1 pg/mL — ABNORMAL LOW (ref 2.0–4.4)

## 2019-06-16 LAB — HEMOGLOBIN A1C
Hgb A1c MFr Bld: 5 % (ref 4.8–5.6)
Mean Plasma Glucose: 96.8 mg/dL

## 2019-06-16 MED ORDER — SODIUM CHLORIDE 0.9% FLUSH: 3.0000 mL | INTRAVENOUS | Status: DC | PRN

## 2019-06-16 MED ORDER — ADULT MULTIVITAMIN W/MINERALS CH
1.0000 | ORAL_TABLET | Freq: Every day | ORAL | Status: DC
  Administered 2019-06-17 – 2019-06-21 (×4): 1 via ORAL
  Filled 2019-06-16 (×5): qty 1

## 2019-06-16 MED ORDER — DOFETILIDE 250 MCG PO CAPS
500.0000 ug | ORAL_CAPSULE | Freq: Two times a day (BID) | ORAL | Status: DC
  Administered 2019-06-16: 500 ug via ORAL
  Filled 2019-06-16: qty 2

## 2019-06-16 MED ORDER — METOPROLOL SUCCINATE ER 25 MG PO TB24
12.5000 mg | ORAL_TABLET | Freq: Every day | ORAL | Status: DC
  Administered 2019-06-16 – 2019-06-18 (×3): 12.5 mg via ORAL
  Filled 2019-06-16 (×3): qty 1

## 2019-06-16 MED ORDER — SODIUM CHLORIDE 0.9 % IV SOLN: 250.0000 mL | INTRAVENOUS | Status: DC | PRN

## 2019-06-16 MED ORDER — POTASSIUM CHLORIDE CRYS ER 20 MEQ PO TBCR
40.0000 meq | EXTENDED_RELEASE_TABLET | Freq: Once | ORAL | Status: AC
  Administered 2019-06-16: 40 meq via ORAL
  Filled 2019-06-16: qty 2

## 2019-06-16 MED ORDER — SODIUM CHLORIDE 0.9% FLUSH
3.0000 mL | Freq: Two times a day (BID) | INTRAVENOUS | Status: DC
  Administered 2019-06-17 – 2019-06-21 (×7): 3 mL via INTRAVENOUS

## 2019-06-16 MED ORDER — MAGNESIUM SULFATE 2 GM/50ML IV SOLN
2.0000 g | Freq: Once | INTRAVENOUS | Status: AC
  Administered 2019-06-16: 2 g via INTRAVENOUS
  Filled 2019-06-16: qty 50

## 2019-06-16 MED ORDER — ENSURE ENLIVE PO LIQD
237.0000 mL | Freq: Three times a day (TID) | ORAL | Status: DC
  Administered 2019-06-17 (×2): 237 mL via ORAL

## 2019-06-16 NOTE — Progress Notes (Signed)
Jillian Eaton  [id number] DOB: September 26, 1950 DOA: 06/14/2019 PCP: [name], MD    Brief Narrative:  69 year old with a history of SBO, HTN, fibromyalgia, obesity, and atrial fibrillation on Xarelto who presented with severe fatigue and unexplained weight loss.  She explains severe shortness of breath and fatigue dating back to March 9, following a knee surgery.  Previous work-ups via her PCP a hematologist and ER visits have been unrevealing.  Significant Events: 4/26 admit via [place]  Antimicrobials:  None  Subjective: Resting comfortably in bed.  States she feels "about the same."  Denies chest pain nausea vomiting or abdominal pain.  Does continue to feel very short of breath when attempting to get up and moving.  Assessment & Plan:  Idiopathic fatigue with poor exercise tolerance There was initial thought that this could be related to anemia, especially given that hemoglobin was dropping from 11.2-9 0.7-8.4 following admission -today however her hemoglobin is 9.5 making it much less conceivable that anemia is the underlying problem -her [id number] and folic acid are normal -a.m. serum cortisol is normal -wonder if this is not primarily simply a rate related issue -cardiology has consulted EP  Chronic iron deficiency anemia Baseline hemoglobin approximately 9.5 -normal hemoglobin electrophoresis and previous work-ups -see discussion above -hemoglobin actually appears stable at this time though initially it appeared that it may be following -ferritin is normal as is her iron though her TIBC is borderline -LDH is normal /haptoglobin is pending but I am not convinced that she is hemolyzing  Postsurgical hypothyroidism Given significant elevation of TSH this may also be contributing to her symptoms -target TSH in this patient should be 1.0 though this may prove to be problematic given her propensity for RVR -while her T4 is normal her free T3 is low -I discussed the  possibility of going back up on her Synthroid but she is very hesitant to do this as she states "I felt very bad when I was taking 200 mcg" -we will attempt to discuss this with her endocrinologist if we choose to further encourage increased dosing but with attempts to better control her atrial fibrillation I will avoid this for now  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 Fully recovered  Chronic persistent atrial fibrillation -severe biatrial dilatation Rate controlled with beta-blocker while resting though dosing limited by propensity for hypotension -has failed multiple prior attempts at cardioversion -appears to be experiencing RVR with exertion - EP has now been consulted and plans to initiate Tikosyn  Pulmonary artery hypertension Care per cardiology  CKD stage III Creatinine appears stable  COPD Well compensated at present with no wheezing  Chronic pain -fibromyalgia  HTN Blood pressure currently well controlled  Morbid obesity  Status post gastric bypass -has reportedly lost approximately 200 pounds over the last 2 years  Seizure disorder Continue Keppra  Depression Continue home treatment regimen  DVT prophylaxis: Eliquis Code Status: FULL CODE Family Communication:  Disposition Plan: PT/OT, not yet medically stable for discharge home as her presenting symptom persist without improvement -ultimate plan will be discharge home to an independent living situation  Consultants:  Cardiology  Objective: Blood pressure (!) 98/53, pulse 81, temperature 98 F (36.7 C), temperature source Oral, resp. rate 15, height 5\' 7"  (1.702 m), weight 91 kg, SpO2 99 %.  Intake/Output Summary (Last 24 hours) at 06/16/2019 0944 Last data filed at 06/16/2019 0900 Gross per 24 hour  Intake 420 ml  Output 0 ml  Net 420 ml  Filed Weights   06/15/19 1607 06/16/19 0519  Weight: 91.3 kg 91 kg    Examination: General: No acute respiratory distress Lungs: Clear to auscultation  bilaterally -no wheezing Cardiovascular: Regular rate without murmur or rub Abdomen: NT/ND, soft, BS positive, no rebound Extremities: No significant edema B LE   CBC: Recent Labs  Lab 06/14/19 1430 06/15/19 0640 06/16/19 0555  WBC 7.0 6.5 4.3  NEUTROABS 5.4  --   --   HGB 9.7* 8.4* 9.5*  HCT 32.0* 27.4* 30.7*  MCV 91.2 90.4 90.6  PLT 320 276 [id number]   Basic Metabolic Panel: Recent Labs  Lab 06/14/19 1430 06/15/19 0640 06/16/19 0555  NA 138 137 140  K 4.3 4.0 3.9  CL 101 103 102  CO2 25 26 29   GLUCOSE 82 66* 68*  BUN 17 11 9   CREATININE 1.23* 1.26* 1.33*  CALCIUM 8.3* 8.1* 8.5*  MG  --   --  1.8  PHOS  --   --  3.2   GFR: Estimated Creatinine Clearance: 47.6 mL/min (A) (by C-G formula based on SCr of 1.33 mg/dL (H)).  Liver Function Tests: Recent Labs  Lab 06/16/19 0555  AST 13*  ALT 9  ALKPHOS 70  BILITOT 0.6  PROT 6.0*  ALBUMIN 2.1*   Coagulation Profile: Recent Labs  Lab 06/14/19 1430  INR 1.6*    Cardiac Enzymes: No results for input(s): CKTOTAL, CKMB, CKMBINDEX, TROPONINI in the last 168 hours.  HbA1C: Hgb A1c MFr Bld  Date/Time Value Ref Range Status  06/16/2019 05:55 AM 5.0 4.8 - 5.6 % Final    Comment:    (NOTE) Pre diabetes:          5.7%-6.4% Diabetes:              >6.4% Glycemic control for   <7.0% adults with diabetes   05/27/2018 05:43 AM 4.6 (L) 4.8 - 5.6 % Final    Comment:    (NOTE) Pre diabetes:          5.7%-6.4% Diabetes:              >6.4% Glycemic control for   <7.0% adults with diabetes     Recent Results (from the past 240 hour(s))  Respiratory Panel by RT PCR (Flu A&B, Covid) - Nasopharyngeal Swab     Status: None   Collection Time: 06/14/19  8:49 PM   Specimen: Nasopharyngeal Swab  Result Value Ref Range Status   SARS Coronavirus 2 by RT PCR NEGATIVE NEGATIVE Final    Comment: (NOTE) SARS-CoV-2 target nucleic acids are NOT DETECTED. The SARS-CoV-2 RNA is generally detectable in upper respiratoy specimens  during the acute phase of infection. The lowest concentration of SARS-CoV-2 viral copies this assay can detect is 131 copies/mL. A negative result does not preclude SARS-Cov-2 infection and should not be used as the sole basis for treatment or other patient management decisions. A negative result may occur with  improper specimen collection/handling, submission of specimen other than nasopharyngeal swab, presence of viral mutation(s) within the areas targeted by this assay, and inadequate number of viral copies (<131 copies/mL). A negative result must be combined with clinical observations, patient history, and epidemiological information. The expected result is Negative. Fact Sheet for Patients:  PinkCheek.be Fact Sheet for Healthcare Providers:  GravelBags.it This test is not yet ap proved or cleared by the [place] FDA and  has been authorized for detection and/or diagnosis of SARS-CoV-2 by FDA under an Emergency Use Authorization (EUA). This EUA  will remain  in effect (meaning this test can be used) for the duration of the COVID-19 declaration under Section 564(b)(1) of the Act, 21 U.S.C. section 360bbb-3(b)(1), unless the authorization is terminated or revoked sooner.    Influenza A by PCR NEGATIVE NEGATIVE Final   Influenza B by PCR NEGATIVE NEGATIVE Final    Comment: (NOTE) The Xpert Xpress SARS-CoV-2/FLU/RSV assay is intended as an aid in  the diagnosis of influenza from Nasopharyngeal swab specimens and  should not be used as a sole basis for treatment. Nasal washings and  aspirates are unacceptable for Xpert Xpress SARS-CoV-2/FLU/RSV  testing. Fact Sheet for Patients: PinkCheek.be Fact Sheet for Healthcare Providers: GravelBags.it This test is not yet approved or cleared by the [place] FDA and  has been authorized for detection and/or diagnosis of  SARS-CoV-2 by  FDA under an Emergency Use Authorization (EUA). This EUA will remain  in effect (meaning this test can be used) for the duration of the  Covid-19 declaration under Section 564(b)(1) of the Act, 21  U.S.C. section 360bbb-3(b)(1), unless the authorization is  terminated or revoked. Performed at [facility], [place] [address], [place], [place] [address]      Scheduled Meds: . apixaban  5 mg Oral BID  . calcitRIOL  0.5 mcg Oral BID  . digoxin  0.125 mg Oral Daily  . docusate sodium  100 mg Oral BID  . DULoxetine  60 mg Oral Daily  . fluticasone furoate-vilanterol  1 puff Inhalation Daily  . levETIRAcetam  500 mg Oral Daily  . levothyroxine  150 mcg Oral Q0600  . magnesium oxide  400 mg Oral Daily  . metoprolol succinate  12.5 mg Oral Daily  . sodium chloride flush  3 mL Intravenous Q12H  . traZODone  50 mg Oral QHS  . zolpidem  5 mg Oral QHS     LOS: 1 day   Cherene Altes, MD [facility] Office  [phone] Pager - Text Page per Amion  If 7PM-7AM, please contact night-coverage per Amion 06/16/2019, 9:44 AM

## 2019-06-16 NOTE — Telephone Encounter (Signed)
Spoke with [place] at the [facility] to schedule appointment. She stated the patient had an appointment last week and was a No Show.  She also states the patient is now admitted to [facility] for fatigue and A Fib.  Will schedule appropriate follow up when patient is discharged.

## 2019-06-16 NOTE — Progress Notes (Signed)
Initial Nutrition Assessment  RD working remotely.  DOCUMENTATION CODES:   Not applicable  INTERVENTION:  Provide Ensure Enlive po TID, each supplement provides 350 kcal and 20 grams of protein.  Provide daily MVI. After discharge recommend patient take bariatric multivitamin with minerals.  NUTRITION DIAGNOSIS:   Unintentional weight loss related to (suspected inadequate oral intake) as evidenced by 31% weight loss over 1 year.  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   Attempted to call patient over the phone but she was unable to answer. According to MD note patient has unexplained weight loss. She reported losing 200 lbs over the past 2 years. Reviewed weights in chart. Patient was 346 lbs on 06/19/2017, 291 lbs on 05/25/2018, 231.2 lbs on 03/11/2019, and is now 91 kg (200.6 lbs). She has lost 90.4 lbs (31% body weight) over the past year, which is significant for time frame. Of that weight loss, she has lost 30.6 lbs (13.2% body weight) over the past 3 months, which is also significant for time frame.  Patient is ordered for regular diet. She is documented to have eaten 50% of her breakfast this morning. Patient would benefit from oral nutrition supplements to help meet calorie/protein needs.  Medications reviewed and include: Colace 100 mg BID, magnesium oxide 400 mg daily.   Labs  reviewed: Glucose 68, Creatinine 1.33. Potassium, Phosphorus, and Magnesium all WNL.  Patient is at risk for malnutrition. Unable to determine if patient meets criteria for malnutrition at this time without full nutrition history or NFPE.  NUTRITION - FOCUSED PHYSICAL EXAM:  Unable to complete at this time as RD is working remotely.  Diet Order:   Diet Order            Diet regular Room service appropriate? Yes; Fluid consistency: Thin  Diet effective now             EDUCATION NEEDS:   No education needs have been identified at this time  Skin:  Skin Assessment: Reviewed RN Assessment  Last BM:  06/15/2019 per chart  Height:   Ht Readings from Last 1 Encounters:  06/15/19 5\' 7"  (1.702 m)   Weight:   Wt Readings from Last 1 Encounters:  06/16/19 91 kg   BMI:  Body mass index is 31.42 kg/m.  Estimated Nutritional Needs:   Kcal:  2200-2400  Protein:  110-120 grams  Fluid:  2 L/day  Jacklynn Barnacle, MS, RD, LDN Pager number available on Amion

## 2019-06-16 NOTE — Evaluation (Signed)
Occupational Therapy Evaluation Patient Details Name: Jillian Eaton [facility] MRN: [id number] DOB: [birth year] Today's Date: 06/16/2019    History of Present Illness 69 y.o. female PMHx: SBO; HTN; fibromyalgia; obesity (BMI 32); PAF on Xarelto; and chronic systolic CHF presenting with fatigue and unexplained weight loss. Problem started after knee surgery on 3/9.   Clinical Impression   Pt PTA: independent with ADL and has hired CG for spouse, but also assists him. Pt reports independence with ADL and mobility with SPC. Pt currently limited by poor activity tolerance. Pt minguardA overall for ADL.  Pt requires assit for LB dressing and plans to wear slip on shoes, but would benefit from energy conservation technique education. O2 on RA, HR <105 BPM with exertion to sink for light grooming. Pt would benefit from continued OT skilled services for ADL, mobility and safety acutely. OT following.    Follow Up Recommendations  No OT follow up;Supervision - Intermittent    Equipment Recommendations  3 in 1 bedside commode    Recommendations for Other Services       Precautions / Restrictions Precautions Precautions: None Restrictions Weight Bearing Restrictions: No      Mobility Bed Mobility Overal bed mobility: Needs Assistance Bed Mobility: Supine to Sit     Supine to sit: Supervision     General bed mobility comments: no physical assist required  Transfers Overall transfer level: Modified independent Equipment used: Straight cane                  Balance Overall balance assessment: Mild deficits observed, not formally tested                                         ADL either performed or assessed with clinical judgement   ADL Overall ADL's : Needs assistance/impaired Eating/Feeding: Supervision/ safety;Sitting   Grooming: Supervision/safety;Standing   Upper Body Bathing: Supervision/ safety;Standing   Lower Body Bathing: Min  guard;Sitting/lateral leans;Sit to/from stand   Upper Body Dressing : Supervision/safety;Sitting   Lower Body Dressing: Min guard;Sitting/lateral leans;Sit to/from stand Lower Body Dressing Details (indicate cue type and reason): Difficulty with socks due to RLE pain at knee. Pt plans to wear slip on shoes for mobility and skip socks all together Toilet Transfer: Supervision/safety   Toileting- Clothing Manipulation and Hygiene: Min guard;Sitting/lateral lean;Sit to/from stand       Functional mobility during ADLs: Supervision/safety;Rolling walker General ADL Comments: Pt appears close to baseline, but fatigues very quickly. O2 on RA >90% and HR remained <100 BPm with exertion. Pt verbalizing that she needed a rest break after walking to the sink.     Vision Baseline Vision/History: No visual deficits Patient Visual Report: No change from baseline Vision Assessment?: No apparent visual deficits     Perception     Praxis      Pertinent Vitals/Pain       Hand Dominance Right   Extremity/Trunk Assessment Upper Extremity Assessment Upper Extremity Assessment: Overall WFL for tasks assessed   Lower Extremity Assessment Lower Extremity Assessment: Generalized weakness;Defer to PT evaluation   Cervical / Trunk Assessment Cervical / Trunk Assessment: Normal   Communication Communication Communication: No difficulties   Cognition Arousal/Alertness: Awake/alert Behavior During Therapy: WFL for tasks assessed/performed Overall Cognitive Status: Within Functional Limits for tasks assessed  General Comments  Pt's HR  <105 BPM with exertion    Exercises     Shoulder Instructions      Home Living Family/patient expects to be discharged to:: Private residence Living Arrangements: Spouse/significant other Available Help at Discharge: Family;Available 24 hours/day Type of Home: Apartment Home Access: Level entry;Elevator      Home Layout: One level     Bathroom Shower/Tub: Occupational psychologist: Handicapped height Bathroom Accessibility: Yes   Home Equipment: Environmental consultant - 2 wheels;Cane - single point;Shower seat;Grab bars - tub/shower;Grab bars - toilet   Additional Comments: pt is caregiver for spouse, spouse requires cues/setup for tasks, no physical assistance      Prior Functioning/Environment Level of Independence: Independent with assistive device(s)        Comments: using cane for ambulation        OT Problem List: Decreased activity tolerance      OT Treatment/Interventions: Self-care/ADL training;Therapeutic exercise;Energy conservation;DME and/or AE instruction;Therapeutic activities;Visual/perceptual remediation/compensation;Balance training;Patient/family education    OT Goals(Current goals can be found in the care plan section) Acute Rehab OT Goals Patient Stated Goal: to go home OT Goal Formulation: With patient Time For Goal Achievement: 06/30/19 Potential to Achieve Goals: Good ADL Goals Pt Will Perform Lower Body Dressing: sitting/lateral leans;sit to/from stand;with adaptive equipment;with set-up Additional ADL Goal #1: Pt will recall 3 energy conservation techniques to use here and at home for increasing activity tolerance.  OT Frequency: Min 2X/week   Barriers to D/C:            Co-evaluation              AM-PAC OT "6 Clicks" Daily Activity     Outcome Measure Help from another person eating meals?: None Help from another person taking care of personal grooming?: None Help from another person toileting, which includes using toliet, bedpan, or urinal?: A Little Help from another person bathing (including washing, rinsing, drying)?: A Little Help from another person to put on and taking off regular upper body clothing?: None Help from another person to put on and taking off regular lower body clothing?: A Little 6 Click Score: 21   End of Session  Equipment Utilized During Treatment: Gait belt Nurse Communication: Mobility status  Activity Tolerance: Patient tolerated treatment well Patient left: in chair;with call bell/phone within reach  OT Visit Diagnosis: Unsteadiness on feet (R26.81);Muscle weakness (generalized) (M62.81)                Time: [id number] OT Time Calculation (min): 20 min Charges:  OT General Charges $OT Visit: 1 Visit OT Evaluation $OT Eval Moderate Complexity: 1 Mod  Jefferey Pica, OTR/L Acute Rehabilitation Services Pager: [phone] Office: [phone] C 06/16/2019, 2:25 PM

## 2019-06-16 NOTE — Consult Note (Addendum)
Cardiology Consultation:   Patient ID: Jillian Eaton [facility] MRN: [id number]; DOB: 1950-02-24  Admit date: 06/14/2019 Date of Consult: 06/16/2019  Primary Care Provider: Harvest Forest, MD Primary Cardiologist: Chrystie Nose, MD  Primary Electrophysiologist:  None    Patient Profile:   Jillian Eaton is a 69 y.o. female with a hx of fibromyalgia, normal coronaries by cath 2012, kidney stones, p.HTN on home O2, 2018 she had an unintentional narcotic OD resulting  In aspiration pneumonia, echo then noted LVEF 25%, though has had subsequent improvement in LVEF, chronic CHF (diastolic), HTN, hypothyroidism (s/p thyroidectomy 2/2 goiter), morbid obesity (s/p ROUX-EN-Y GASTRIC BYPASS in [place]), Iron def anemia, CKD (III), and Afib (felt to be permanent) who is being seen today for the evaluation of difficult to control Afib at the request of Dr. Elease Hashimoto.  History of Present Illness:   Jillian Eaton in review of Dr. Blanchie Dessert notes had been doing well with rate controlled Afib, over the last couple years weight waxing/waning with her thyroid dose adjustements though overall significant weight loss. April 2020 she had new onset seizures and during this stay had RVR her coreg changed to Toprol.  Done well again untili this year with some increased SOB (suspected more pulmonary).  At his last visit Feb 2021, she was doing better ater adjustment in her inhalers, with mention of mod persistent asthma.  Her diuretics had also been increased.  She was getting iron as well for anemia.  She was admitted to [facility] 06/14/2019 for exertional palpitations and worsening DOE.  Cardiology consult noted Chest CT performed in October 2020 shows an enlarged pulmonary artery consistent with pulmonary hypertension.  Pulmonary function tests show relatively matched restrictive/obstructive parameters with a total lung capacity of roughly 70% of predicted and commensurate reduction in DLCO  CT chest this  admission IMPRESSION: 1. 9 mm obstructing renal stone in the distal right ureter, with additional 7 mm obstructing renal stone seen at the right UVJ. 2. Evidence of prior gastric bypass surgery. 3. Small hiatal hernia. 4. No acute or active cardiopulmonary disease   She was found to have rate control at rest though with exertion rates rise sharply with SOB.   BP limitingtitration of her BB, dig was added on admission Her anemia thought to play a significant role, as well as her obesity (though noting excellent efforts by the pt in the last couple years with >100lbs lost), not clearly volume OL, though thoughts towards RHC discussed.  She had R knee surgery early 04/29/2019,  symptoms started afterwards, has been on her Eliquis without missed doses sine the evening of her knee surgery.  She saw pulmonary 06/12/2019 felt pulm-wise was stable suggested she f/u with cardiology for RVR  She recalls the same thing happened after her knee surgery in Sept/Oct time of last year, then evaluated for PE and negative as well.  She does notrecall any speific interventions, just finally seemed to settle back down.  She is on lopressor 12.5mg  BID (from 25mg  yesterday) and dig (0.25mg   (x3 doses) then 0.125mg  daily   LABS K+ 3.9 BUN/Creat 9/1.33 Mag 1.8 BNP 141 HS Trop 3, >2 WBC 4.3 H/H 9.5/30.7  (Hgb has been 11.2 > 9.7 > 8.4 > 9.5 this admission with no transfusions) Plts 321 TSH 7.71   COVID neg    AFib hx Diagnosed April 2019 (while on vacation in [place]), hospitalized there w/RVR In 2019 her weight 413 (pt confirmed) >> 200lbs this admission   AAD Hx  Amiodarone June 2019, started during a hospital stay July 2019 failed DCCV (shocked 3x)  >> recommended rate control strategy July 2019, amio stopped BP has limited somewhat her diuretics/rate limiting meds  Past Medical History:  Diagnosis Date   Anemia    iron and pernicious   Arthritis    Asthma    flare up last yr   CHF  (congestive heart failure) (HCC)    Chronic headache    " better now "    Complication of anesthesia    BP dropped    DOE (dyspnea on exertion)    2D ECHO, 02/12/2012 - EF 60-65%, moderate concentric hypertrophy   Dysrhythmia    Fibromyalgia    nerve pain"left side at waist level" "can't lay on that side without pain" , "HOB elevation helps"; pt. thinks this has resolved after 200lb weight loss9-17- since i lost the wieght , i dont  think i have this anymore    Heart murmur    Hematuria - cause not known    resolved    History of kidney stones    x 2 '13, '14 surgery to remove   Hypertension    Pneumonia    aspiration  04/2016   SBO (small bowel obstruction) (HCC)    rqueired admission 2020   Thyroid disease    "goiter"   Transfusion history    10 yrs+    Past Surgical History:  Procedure Laterality Date   ABDOMINAL ADHESION SURGERY  2004   open LOA - in [place]   CARDIAC CATHETERIZATION  04/04/2010   No significant obstructive coronary artery disease   CARDIOVERSION N/A 08/08/2017   Procedure: CARDIOVERSION;  Surgeon: Chrystie Nose, MD;  Location: [facility];  Service: Cardiovascular;  Laterality: N/A;   CHOLECYSTECTOMY  1990   COLONOSCOPY W/ POLYPECTOMY     COLONOSCOPY WITH PROPOFOL N/A 04/10/2014   Procedure: COLONOSCOPY WITH PROPOFOL;  Surgeon: Theda Belfast, MD;  Location: [facility] ENDOSCOPY;  Service: Endoscopy;  Laterality: N/A;   EYE SURGERY     lasik 20-25 yrs ago   GASTROPLASTY VERTICAL BANDED  1985   LAPAROSCOPIC LYSIS INTESTINAL ADHESIONS  1995   In [place] for SBO   REVERSE SHOULDER ARTHROPLASTY Right 03/29/2018   Procedure: REVERSE SHOULDER ARTHROPLASTY;  Surgeon: Beverely Low, MD;  Location: [facility] OR;  Service: Orthopedics;  Laterality: Right;   ROUX-EN-Y GASTRIC BYPASS  1992   Conversion VBG to RnYGB in [place]   SHOULDER ARTHROSCOPY WITH SUBACROMIAL DECOMPRESSION  2016   Dr Bennett Scrape, [place]   SMALL INTESTINE SURGERY  2000   SBO - LOA w SB  resection   TOTAL KNEE ARTHROPLASTY Left 11/14/2018   Procedure: TOTAL KNEE ARTHROPLASTY;  Surgeon: Durene Romans, MD;  Location: [facility] ORS;  Service: Orthopedics;  Laterality: Left;  70 mins   TOTAL KNEE ARTHROPLASTY Right 04/29/2019   Procedure: TOTAL KNEE ARTHROPLASTY;  Surgeon: Durene Romans, MD;  Location: [facility] ORS;  Service: Orthopedics;  Laterality: Right;  70 mins   TOTAL THYROIDECTOMY  06/26/2012   Dr Lady Gary, [facility]   TUBAL LIGATION  1986   URETHRAL DILATION  2012   w cystoscopy.  Dr Patsi Sears   UTERINE FIBROID EMBOLIZATION       Home Medications:  Prior to Admission medications   Medication Sig Start Date End Date Taking? Authorizing Provider  acetaminophen (TYLENOL) 500 MG tablet Take 2 tablets (1,000 mg total) by mouth every 8 (eight) hours. Patient taking differently: Take 1,000 mg by mouth  daily.  04/30/19  Yes Babish, Molli Hazard, PA-C  calcitRIOL (ROCALTROL) 0.5 MCG capsule Take 0.5 mcg by mouth 2 (two) times daily.    Yes [provider]  clotrimazole (LOTRIMIN) 1 % cream APPLY TO AFFECTED AREA TWICE A DAY Patient taking differently: Apply 1 application topically daily as needed (itching).  02/07/19  Yes Freddie Breech, DPM  cyanocobalamin (,VITAMIN B-12,) 1000 MCG/ML injection Inject 1 mL (1,000 mcg total) into the muscle every 30 (thirty) days. 04/05/18  Yes Margit Hanks, MD  cyclobenzaprine (FLEXERIL) 10 MG tablet Take 2 tablets (20 mg total) by mouth 2 (two) times daily. Patient taking differently: Take 20 mg by mouth at bedtime.  04/05/18  Yes Margit Hanks, MD  DULoxetine (CYMBALTA) 60 MG capsule Take 1 capsule (60 mg total) by mouth daily. 04/05/18  Yes Margit Hanks, MD  ELIQUIS 5 MG TABS tablet TAKE 1 TABLET TWICE A DAY Patient taking differently: Take 5 mg by mouth 2 (two) times daily.  02/05/19  Yes Hilty, Lisette Abu, MD  eszopiclone (LUNESTA) 2 MG TABS tablet Take 1 tablet (2 mg total) by mouth at bedtime. 04/05/18  Yes Margit Hanks, MD    ferrous sulfate (FERROUSUL) 325 (65 FE) MG tablet Take 1 tablet (325 mg total) by mouth 3 (three) times daily with meals for 14 days. Patient taking differently: Take 325 mg by mouth 2 (two) times daily with a meal.  04/30/19 07/18/19 Yes Babish, Molli Hazard, PA-C  fluticasone furoate-vilanterol (BREO ELLIPTA) 100-25 MCG/INH AEPB Inhale 1 puff into the lungs daily. 03/14/19  Yes Coral Ceo, NP  furosemide (LASIX) 20 MG tablet Take 1 tablet (20 mg total) by mouth as needed. Patient taking differently: Take 20 mg by mouth daily as needed for fluid or edema.  01/24/19  Yes Hilty, Lisette Abu, MD  levETIRAcetam (KEPPRA) 500 MG tablet Take 1 tablet (500 mg total) by mouth 2 (two) times daily. 10/02/18  Yes Glean Salvo, NP  levothyroxine (SYNTHROID) 150 MCG tablet TAKE 1 TABLET BY MOUTH EVERY DAY Patient taking differently: Take 150 mcg by mouth daily before breakfast.  04/07/19  Yes Carlus Pavlov, MD  magnesium oxide (MAG-OX) 400 (241.3 Mg) MG tablet Take 400 mg by mouth daily.  04/29/19  Yes [provider]  metoprolol succinate (TOPROL-XL) 25 MG 24 hr tablet Take 25 mg by mouth daily.    Yes [provider]  NONFORMULARY OR COMPOUNDED ITEM  Apothecary:  Antifungal Cream - Terbinafine 3%, Fluconazole 2%, Tea Tree Oil 5%, Urea 10%, Ibuprofen 2% in DMSO Suspension #29ml. Apply to affected toenail(s) once daily (at bedtime) or twice daily. Patient taking differently: Apply 1 application topically See admin instructions. [facility]:  Antifungal Cream - Terbinafine 3%, Fluconazole 2%, Tea Tree Oil 5%, Urea 10%, Ibuprofen 2% in DMSO Suspension #76ml. Apply to affected toenail(s) once daily as needed for fungus. 05/07/18  Yes Freddie Breech, DPM  traZODone (DESYREL) 50 MG tablet Take 1 tablet (50 mg total) by mouth at bedtime. 04/05/18  Yes Margit Hanks, MD  Vitamin D, Ergocalciferol, (DRISDOL) 1.25 MG (50000 UT) CAPS capsule Take 1 capsule (50,000 Units total) by mouth  every Wednesday. 04/10/18  Yes Margit Hanks, MD    Inpatient Medications: Scheduled Meds:  apixaban  5 mg Oral BID   calcitRIOL  0.5 mcg Oral BID   digoxin  0.125 mg Oral Daily   docusate sodium  100 mg Oral BID   DULoxetine  60 mg Oral Daily  feeding supplement (ENSURE ENLIVE)  237 mL Oral TID BM   fluticasone furoate-vilanterol  1 puff Inhalation Daily   levETIRAcetam  500 mg Oral Daily   levothyroxine  150 mcg Oral Q0600   magnesium oxide  400 mg Oral Daily   metoprolol succinate  12.5 mg Oral Daily   [START ON 06/17/2019] multivitamin with minerals  1 tablet Oral Daily   sodium chloride flush  3 mL Intravenous Q12H   traZODone  50 mg Oral QHS   zolpidem  5 mg Oral QHS   Continuous Infusions:   PRN Meds: acetaminophen **OR** [DISCONTINUED] acetaminophen, albuterol, bisacodyl, cyclobenzaprine, hydrALAZINE, morphine injection, ondansetron **OR** ondansetron (ZOFRAN) IV, polyethylene glycol, zolpidem  Allergies:    Allergies  Allergen Reactions   Hydrocodone Itching         Social History:   Social History   Socioeconomic History   Marital status: Married    Spouse name: Not on file   Number of children: 3   Years of education: Not on file   Highest education level: Master's degree (e.g., MA, MS, MEng, MEd, MSW, MBA)  Occupational History   Occupation: retired  Tobacco Use   Smoking status: Never Smoker   Smokeless tobacco: Never Used  Substance and Sexual Activity   Alcohol use: No    Comment: wine occ   Drug use: No    Types: Oxycodone    Comment: perscribed   Sexual activity: Not Currently    Birth control/protection: None  Other Topics Concern   Not on file  Social History Narrative   Right handed   1 cup of caffeine per day        Social Determinants of Health   Financial Resource Strain:    Difficulty of Paying Living Expenses:   Food Insecurity:    Worried About Programme researcher, broadcasting/film/video in the Last Year:    Barista in the Last Year:     Transportation Needs:    Freight forwarder (Medical):    Lack of Transportation (Non-Medical):   Physical Activity:    Days of Exercise per Week:    Minutes of Exercise per Session:   Stress:    Feeling of Stress :   Social Connections:    Frequency of Communication with Friends and Family:    Frequency of Social Gatherings with Friends and Family:    Attends Religious Services:    Active Member of Clubs or Organizations:    Attends Engineer, structural:    Marital Status:   Intimate Partner Violence:    Fear of Current or Ex-Partner:    Emotionally Abused:    Physically Abused:    Sexually Abused:     Family History:   Family History  Problem Relation Age of Onset   Diabetes Mother    Epilepsy Mother    Cancer Mother        Breast   Hypertension Mother    Breast cancer Mother    Seizures Mother    Kidney disease Father    Diabetes Father    Hypertension Father    Asthma Father    Heart disease Father    Epilepsy Sister    Cancer Maternal Grandmother    Breast cancer Maternal Grandmother    Cancer Paternal Grandmother    Breast cancer Paternal Grandmother      ROS:  Please see the history of present illness.  All other ROS reviewed and negative.     Physical  Exam/Data:   Vitals:   06/16/19 0519 06/16/19 0921 06/16/19 1349 06/16/19 1359  BP: 102/69 (!) 98/53 (!) 84/60 91/67  Pulse: 81  72 68  Resp: 15  18   Temp: 98 F (36.7 C)  97.9 F (36.6 C)   TempSrc: Oral  Oral   SpO2: 99% 98% 100%   Weight: 91 kg     Height:        Intake/Output Summary (Last 24 hours) at 06/16/2019 1426 Last data filed at 06/16/2019 0921 Gross per 24 hour  Intake 540 ml  Output 0 ml  Net 540 ml   Last 3 Weights 06/16/2019 06/15/2019 06/13/2019  Weight (lbs) 200 lb 9.6 oz 201 lb 4.5 oz 204 lb 12.8 oz  Weight (kg) 90.992 kg 91.3 kg 92.897 kg     Body mass index is 31.42 kg/m.  General:  Well nourished, well developed, in no acute distress HEENT:  normal Lymph: no adenopathy Neck: no JVD Endocrine:  No thryomegaly Vascular: No carotid bruits  Cardiac: irreg-irreg; soft SM, no gallops or rubs Lungs:  CTA b/l, no wheezing, rhonchi or rales  Abd: soft, nontender Ext: no edema Musculoskeletal:  No deformities Skin: warm and dry  Neuro:   No gross focal abnormalities noted Psych:  Normal affect   EKG:  The EKG was personally reviewed and demonstrates:   4/24: AFib 104bpm 4/15: AFib 106bpm 4/12 AFib 95bpm  Telemetry:  Telemetry was personally reviewed and demonstrates:   AFib 60's-80's, periods of 110's-130's (reportedly are associated with times of exertion)     Relevant CV Studies:  06/15/2019: TTE IMPRESSIONS  1. Left ventricular ejection fraction, by estimation, is 60 to 65%. The  left ventricle has normal function. The left ventricle has no regional  wall motion abnormalities. Left ventricular diastolic parameters are  indeterminate.   2. Right ventricular systolic function is normal. The right ventricular  size is mildly enlarged. There is moderately elevated pulmonary artery  systolic pressure. The estimated right ventricular systolic pressure is  50.7 mmHg.   3. Left atrial size was moderately dilated. (38mm)  4. Right atrial size was moderately dilated.   5. The mitral valve is normal in structure. Mild mitral valve  regurgitation. No evidence of mitral stenosis.   6. Tricuspid valve regurgitation is moderate.   7. The aortic valve is tricuspid. Aortic valve regurgitation is trivial.  Mild aortic valve sclerosis is present, with no evidence of aortic valve  stenosis.   8. The inferior vena cava is normal in size with greater than 50%  respiratory variability, suggesting right atrial pressure of 3 mmHg.   Comparison(s): No significant change from prior study. Prior images  reviewed side by side.    12/08/2018 echocardiogram 1. Left ventricular ejection fraction, by visual estimation, is 55 to  60%. The  left ventricle has normal function. Normal left ventricular size.  There is no left ventricular hypertrophy.   2. Left ventricular diastolic function could not be evaluated pattern of  LV diastolic filling.   3. Global right ventricle has normal systolic function.The right  ventricular size is mildly enlarged. No increase in right ventricular wall  thickness.   4. Left atrial size was severely dilated.   5. Right atrial size was severely dilated.   6. The mitral valve is normal in structure. Trace mitral valve  regurgitation.   7. The tricuspid valve is normal in structure. Tricuspid valve  regurgitation mild-moderate.   8. The aortic valve is normal in structure. Aortic  valve regurgitation is  trivial by color flow Doppler.   9. The pulmonic valve was grossly normal. Pulmonic valve regurgitation is  not visualized by color flow Doppler.  10. Normal pulmonary artery systolic pressure.  11. The inferior vena cava is normal in size with greater than 50%  respiratory variability, suggesting right atrial pressure of 3 mmHg.  TRICUSPID VALVE          TR Peak grad:   24.8 mmHg           TR Vmax:        249.00 cm/s    LHC 2012 FINDINGS: 1. Left main - short, no disease. 2. LAD - no significant disease. 3. Left circumflex, no significant disease. 4. RCA - dominant, no disease, large-caliber vessel. 5. LVEDP = 20 mmHg. 6. RA - 12. 7. RV 38/12. 8. PA - 43/19 (31). 9. PCWP - 24. 10.TPG - 7. 11.Fick cardiac output/Fick cardiac index - 10.56/3.84. 12.Thermodilution cardiac output/thermodilution cardiac index -     6.78/2.47. 13.Aortic saturation - 94%. 14.PA saturation - 68%.   IMPRESSION: 1. No significant obstructive coronary artery disease. 2. LVEDP = 20 mmHg. 3. Borderline pulmonary venous hypertension. 4. High cardiac output.  Laboratory Data:  High Sensitivity Troponin:   Recent Labs  Lab 06/14/19 1430 06/14/19 2049  TROPONINIHS 3 <2     Chemistry Recent Labs  Lab  06/14/19 1430 06/15/19 0640 06/16/19 0555  NA 138 137 140  K 4.3 4.0 3.9  CL 101 103 102  CO2 25 26 29   GLUCOSE 82 66* 68*  BUN 17 11 9   CREATININE 1.23* 1.26* 1.33*  CALCIUM 8.3* 8.1* 8.5*  GFRNONAA 45* 44* 41*  GFRAA 53* 51* 48*  ANIONGAP 12 8 9     Recent Labs  Lab 06/16/19 0555  PROT 6.0*  ALBUMIN 2.1*  AST 13*  ALT 9  ALKPHOS 70  BILITOT 0.6   Hematology Recent Labs  Lab 06/14/19 1430 06/14/19 1430 06/15/19 0640 06/16/19 0555 06/16/19 1025  WBC 7.0  --  6.5 4.3  --   RBC 3.51*   < > 3.03* 3.39* 4.08  HGB 9.7*  --  8.4* 9.5*  --   HCT 32.0*  --  27.4* 30.7*  --   MCV 91.2  --  90.4 90.6  --   MCH 27.6  --  27.7 28.0  --   MCHC 30.3  --  30.7 30.9  --   RDW 15.9*  --  16.0* 15.9*  --   PLT 320  --  276 321  --    < > = values in this interval not displayed.   BNP Recent Labs  Lab 06/13/19 1429 06/14/19 1430  BNP  --  141.0*  PROBNP 251.0*  --     DDimer No results for input(s): DDIMER in the last 168 hours.   Radiology/Studies:   CT CHEST W CONTRAST Result Date: 06/14/2019 CLINICAL DATA:  Nausea, vomiting and shortness of breath. EXAM: CT CHEST, ABDOMEN, AND PELVIS WITH CONTRAST TECHNIQUE: Multidetector CT imaging of the chest, abdomen and pelvis was performed following the standard protocol during bolus administration of intravenous contrast. CONTRAST:  OMNIPAQUE IOHEXOL 300 MG/ML  SOLN COMPARISON:  None. FINDINGS: CT CHEST FINDINGS Cardiovascular: No intraluminal filling defects are seen within the pulmonary arteries. The pulmonary inflow tract measures approximately 3.8 cm while the right pulmonary artery measures 3.8 cm. Normal heart size. No pericardial effusion. Mediastinum/Nodes: No enlarged mediastinal, hilar, or axillary lymph nodes. Thyroid gland,  trachea, and esophagus demonstrate no significant findings. Lungs/Pleura: Mild linear scarring and/or atelectasis is seen within the right lower lobe. There is no evidence of a pleural effusion or  pneumothorax. Musculoskeletal: A right shoulder prosthesis is seen with associated streak artifact. Multilevel degenerative changes seen throughout the thoracic spine. CT ABDOMEN PELVIS FINDINGS Hepatobiliary: No focal liver abnormality is seen. Status post cholecystectomy. No biliary dilatation. Pancreas: Unremarkable. No pancreatic ductal dilatation or surrounding inflammatory changes. Spleen: Normal in size without focal abnormality. Adrenals/Urinary Tract: Adrenal glands are unremarkable. There is mild asymmetric enlargement of the right kidney. The left kidney is normal in size. There is marked severity right-sided hydronephrosis and hydroureter with delayed renal cortical enhancement seen throughout the right kidney. A 9 mm obstructing renal stone is seen in the distal right ureter, with additional 7 mm obstructing renal stone seen at the right UVJ. Bladder is unremarkable. Stomach/Bowel: Multiple surgical clips and surgical sutures are seen within the gastric region. There is a small hiatal hernia. Surgically anastomosed bowel is seen within the anterior aspect of the right upper quadrant. Appendix appears normal. No evidence of bowel wall thickening, distention, or inflammatory changes. Vascular/Lymphatic: No significant vascular findings are present. No enlarged abdominal or pelvic lymph nodes. Reproductive: Multiple coarse calcifications of various sizes are seen scattered throughout the uterus. Other: No abdominal wall hernia or abnormality. No abdominopelvic ascites. Musculoskeletal: Multilevel degenerative changes are seen throughout the lumbar spine. IMPRESSION: 1. 9 mm obstructing renal stone in the distal right ureter, with additional 7 mm obstructing renal stone seen at the right UVJ. 2. Evidence of prior gastric bypass surgery. 3. Small hiatal hernia. 4. No acute or active cardiopulmonary disease. Electronically Signed   By: Aram Candela M.D.   On: 06/14/2019 19:33     DG Chest Port 1  View Result Date: 06/14/2019 CLINICAL DATA:  Cough. EXAM: PORTABLE CHEST 1 VIEW COMPARISON:  June 05, 2019 FINDINGS: Stable cardiomegaly. The hila and mediastinum are unchanged. No pneumothorax. No nodules or masses. No focal infiltrates or overt edema. IMPRESSION: No active disease. Electronically Signed   By: Gerome Sam III M.D   On: 06/14/2019 14:50   {  Assessment and Plan:   1. AFib (described as permanent)     CHA2DS2Vasc is at least 4, on Eliquis appropriately dosed without missed doses per the patient since her knee surgery in March  She failed DCCV back in 2019 (on amio a few weeks) though she weighed 400lbs then She is 200lbs now and I think that changes things for her for rhythm control thoughts. Her BMI is currently 79, I think we could consider AFib ablation now for her, though getting SR 1st if able would be best strategy to improve ablation success in hopes her LA size would decrease prior to thoughts of ablation.  She does have p.HTN by history, and some asthma Echo this admission with LVEF 60-65%, indeterminate diastolic function LA described as mod dilated, measured 38mm  (Oct 2020 described as severely dilated 44mm) RV systolic function  Normal, mildly enlarged RV pressure 50.7   Will review with Dr.Wilfrido Luedke Perhaps consider DCCV I think QT would allow for Tikosyn (sotalol perhaps though her BP does not tolerate much), Multaq also an option. I will get a dig level for AM lab (trazodone combo may prolong QT, though not contraindicated) She is young and with some pulm history amio not ideal    Dr. Ladona Ridgel has seen and examined the patient. Discussed medication options, will plan for Tikosyn,  pt is aware of the need for 31/2 more days to load and is agreeable.  She is aware will need DCCV if she does not convert with drug K+ 3.9 Mag 1.8 Creat 1.33 (Calc CrCl using actual weight is 59 today, 62 yesterday) QT by her EKG 06/14/19 is good    Potassium and mag  replacement ordered, discussed with Dr. Ladona Ridgel, Creat Cl and he has reviewed EKG, Do not suspect or expect dig level to be >2.0 and OK to get Tikosyn started tonight  Will start tonight, no need to recheck lytes prior to dose tonight.  For questions or updates, please contact [facility] Please consult [email or web address] for contact info under   Signed, Sheilah Pigeon, PA-C  06/16/2019 2:26 PM   EP Attending  Patient seen and examined. Agree with the findings as noted above. The paitent is a pleasant obese woman who has lost half of her body weight. She has been thought to have chronic atrial fib although she failed amio when she weighed over 400 lbs and her failure to cardiovert was likely related to her obesity and predilection for fat with amiodarone. The patient has been admitted with symptomatic atrial fib and mixed CHF. She has a h/o chronic systolic heart failure. She denies chest pain. Her exam reveals a pleasant middle aged o bese woman, NAD. Lungs were clear with CV revealing an IRIR rhythm. She has some lymphadema on exam. Her QT is acceptable. I have discussed the treatment options with the patient. We will attempt NSR with dofetilide. She understands that she will require 3 days in the hospital on the monitor.  Leonia Reeves.D.

## 2019-06-16 NOTE — Plan of Care (Signed)
  Problem: Education: Goal: Ability to demonstrate management of disease process will improve Outcome: Progressing Goal: Ability to verbalize understanding of medication therapies will improve Outcome: Progressing   Problem: Activity: Goal: Capacity to carry out activities will improve Outcome: Progressing   Problem: Cardiac: Goal: Ability to achieve and maintain adequate cardiopulmonary perfusion will improve Outcome: Progressing   

## 2019-06-16 NOTE — Progress Notes (Signed)
Progress Note  Patient Name: Jillian Eaton [facility] Date of Encounter: 06/16/2019  Primary Cardiologist: Pixie Casino, MD   Subjective   69 year old female with a history of atrial fibrillation.  She has fairly well controlled ventricular respons at rest but develops rapid ventricular response with any sort of exertion.  She was started on digoxin several days ago.  She has chronic diastolic heart failure and also is noted to have pulmonary hypertension but this is likely due to her chronic diastolic heart failure.    Inpatient Medications    Scheduled Meds: . apixaban  5 mg Oral BID  . calcitRIOL  0.5 mcg Oral BID  . digoxin  0.125 mg Oral Daily  . docusate sodium  100 mg Oral BID  . DULoxetine  60 mg Oral Daily  . fluticasone furoate-vilanterol  1 puff Inhalation Daily  . levETIRAcetam  500 mg Oral Daily  . levothyroxine  150 mcg Oral Q0600  . magnesium oxide  400 mg Oral Daily  . metoprolol succinate  12.5 mg Oral Daily  . sodium chloride flush  3 mL Intravenous Q12H  . traZODone  50 mg Oral QHS  . zolpidem  5 mg Oral QHS   Continuous Infusions:  PRN Meds: acetaminophen **OR** [DISCONTINUED] acetaminophen, albuterol, bisacodyl, cyclobenzaprine, hydrALAZINE, morphine injection, ondansetron **OR** ondansetron (ZOFRAN) IV, polyethylene glycol, zolpidem   Vital Signs    Vitals:   06/15/19 2053 06/16/19 0020 06/16/19 0519 06/16/19 0921  BP: 95/64 102/70 102/69 (!) 98/53  Pulse:  71 81   Resp: 18 14 15    Temp:  97.8 F (36.6 C) 98 F (36.7 C)   TempSrc:  Oral Oral   SpO2:  100% 99% 98%  Weight:   91 kg   Height:        Intake/Output Summary (Last 24 hours) at 06/16/2019 1310 Last data filed at 06/16/2019 [id number] Gross per 24 hour  Intake 540 ml  Output 0 ml  Net 540 ml   Last 3 Weights 06/16/2019 06/15/2019 06/13/2019  Weight (lbs) 200 lb 9.6 oz 201 lb 4.5 oz 204 lb 12.8 oz  Weight (kg) 90.992 kg 91.3 kg 92.897 kg      Telemetry    Atrial fibrillation  with a controlled ventricular response- Personally Reviewed  ECG     - Personally Reviewed  Physical Exam   GEN: No acute distress.  Middle-aged female, no acute distress Neck: No JVD Cardiac:  Irregularly irregular Respiratory: Clear to auscultation bilaterally. GI: Soft, nontender, non-distended  MS: No edema; No deformity. Neuro:  Nonfocal  Psych: Normal affect   Labs    High Sensitivity Troponin:   Recent Labs  Lab 06/14/19 1430 06/14/19 2049  TROPONINIHS 3 <2      Chemistry Recent Labs  Lab 06/14/19 1430 06/15/19 0640 06/16/19 0555  NA 138 137 140  K 4.3 4.0 3.9  CL 101 103 102  CO2 25 26 29   GLUCOSE 82 66* 68*  BUN 17 11 9   CREATININE 1.23* 1.26* 1.33*  CALCIUM 8.3* 8.1* 8.5*  PROT  --   --  6.0*  ALBUMIN  --   --  2.1*  AST  --   --  13*  ALT  --   --  9  ALKPHOS  --   --  70  BILITOT  --   --  0.6  GFRNONAA 45* 44* 41*  GFRAA 53* 51* 48*  ANIONGAP 12 8 9      Hematology Recent Labs  Lab  06/14/19 1430 06/14/19 1430 06/15/19 0640 06/16/19 0555 06/16/19 1025  WBC 7.0  --  6.5 4.3  --   RBC 3.51*   < > 3.03* 3.39* 4.08  HGB 9.7*  --  8.4* 9.5*  --   HCT 32.0*  --  27.4* 30.7*  --   MCV 91.2  --  90.4 90.6  --   MCH 27.6  --  27.7 28.0  --   MCHC 30.3  --  30.7 30.9  --   RDW 15.9*  --  16.0* 15.9*  --   PLT 320  --  276 321  --    < > = values in this interval not displayed.    BNP Recent Labs  Lab 06/13/19 1429 06/14/19 1430  BNP  --  141.0*  PROBNP 251.0*  --      DDimer No results for input(s): DDIMER in the last 168 hours.   Radiology    CT CHEST W CONTRAST  Result Date: 06/14/2019 CLINICAL DATA:  Nausea, vomiting and shortness of breath. EXAM: CT CHEST, ABDOMEN, AND PELVIS WITH CONTRAST TECHNIQUE: Multidetector CT imaging of the chest, abdomen and pelvis was performed following the standard protocol during bolus administration of intravenous contrast. CONTRAST:  167mL OMNIPAQUE IOHEXOL 300 MG/ML  SOLN COMPARISON:  None.  FINDINGS: CT CHEST FINDINGS Cardiovascular: No intraluminal filling defects are seen within the pulmonary arteries. The pulmonary inflow tract measures approximately 3.8 cm while the right pulmonary artery measures 3.8 cm. Normal heart size. No pericardial effusion. Mediastinum/Nodes: No enlarged mediastinal, hilar, or axillary lymph nodes. Thyroid gland, trachea, and esophagus demonstrate no significant findings. Lungs/Pleura: Mild linear scarring and/or atelectasis is seen within the right lower lobe. There is no evidence of a pleural effusion or pneumothorax. Musculoskeletal: A right shoulder prosthesis is seen with associated streak artifact. Multilevel degenerative changes seen throughout the thoracic spine. CT ABDOMEN PELVIS FINDINGS Hepatobiliary: No focal liver abnormality is seen. Status post cholecystectomy. No biliary dilatation. Pancreas: Unremarkable. No pancreatic ductal dilatation or surrounding inflammatory changes. Spleen: Normal in size without focal abnormality. Adrenals/Urinary Tract: Adrenal glands are unremarkable. There is mild asymmetric enlargement of the right kidney. The left kidney is normal in size. There is marked severity right-sided hydronephrosis and hydroureter with delayed renal cortical enhancement seen throughout the right kidney. A 9 mm obstructing renal stone is seen in the distal right ureter, with additional 7 mm obstructing renal stone seen at the right UVJ. Bladder is unremarkable. Stomach/Bowel: Multiple surgical clips and surgical sutures are seen within the gastric region. There is a small hiatal hernia. Surgically anastomosed bowel is seen within the anterior aspect of the right upper quadrant. Appendix appears normal. No evidence of bowel wall thickening, distention, or inflammatory changes. Vascular/Lymphatic: No significant vascular findings are present. No enlarged abdominal or pelvic lymph nodes. Reproductive: Multiple coarse calcifications of various sizes are  seen scattered throughout the uterus. Other: No abdominal wall hernia or abnormality. No abdominopelvic ascites. Musculoskeletal: Multilevel degenerative changes are seen throughout the lumbar spine. IMPRESSION: 1. 9 mm obstructing renal stone in the distal right ureter, with additional 7 mm obstructing renal stone seen at the right UVJ. 2. Evidence of prior gastric bypass surgery. 3. Small hiatal hernia. 4. No acute or active cardiopulmonary disease. Electronically Signed   By: Virgina Norfolk M.D.   On: 06/14/2019 19:33   CT ABDOMEN PELVIS W CONTRAST  Result Date: 06/14/2019 CLINICAL DATA:  Weakness, fatigue and increased heart rate. EXAM: CT CHEST, ABDOMEN, AND PELVIS WITH  CONTRAST TECHNIQUE: Multidetector CT imaging of the chest, abdomen and pelvis was performed following the standard protocol during bolus administration of intravenous contrast. CONTRAST:  184mL OMNIPAQUE IOHEXOL 300 MG/ML  SOLN COMPARISON:  January 04, 2019 FINDINGS: CT CHEST FINDINGS Cardiovascular: No intraluminal filling defects are seen within the pulmonary arteries. The pulmonary inflow tract measures approximately 3.8 cm while the right pulmonary artery measures 3.8 cm. Normal heart size. No pericardial effusion. Mediastinum/Nodes: Lungs/Pleura: Mild linear scarring and/or atelectasis is seen within the right lower lobe. There is no evidence of a pleural effusion or pneumothorax. Musculoskeletal: A right shoulder prosthesis is seen with associated streak artifact. Multilevel degenerative changes seen throughout the thoracic spine. CT ABDOMEN PELVIS FINDINGS Hepatobiliary: No focal liver abnormality is seen. Status post cholecystectomy. No biliary dilatation. Pancreas: Unremarkable. No pancreatic ductal dilatation or surrounding inflammatory changes. Spleen: Normal in size without focal abnormality. Adrenals/Urinary Tract: Adrenal glands are unremarkable. There is mild asymmetric enlargement of the right kidney. The left kidney is  normal in size. There is marked severity right-sided hydronephrosis and hydroureter with delayed renal cortical enhancement seen throughout the right kidney. A 9 mm obstructing renal stone is seen in the distal right ureter, with additional 7 mm obstructing renal stone seen at the right UVJ. Bladder is unremarkable. Stomach/Bowel: Multiple surgical clips and surgical sutures are seen within the gastric region. There is a small hiatal hernia. Surgically anastomosed bowel is seen within the anterior aspect of the right upper quadrant. Appendix appears normal. No evidence of bowel wall thickening, distention, or inflammatory changes. Vascular/Lymphatic: No significant vascular findings are present. No enlarged abdominal or pelvic lymph nodes. Reproductive: Multiple coarse calcifications of various sizes are seen scattered throughout the uterus. Other: No abdominal wall hernia or abnormality. No abdominopelvic ascites. Musculoskeletal: Multilevel degenerative changes are seen throughout the lumbar spine. IMPRESSION: 1. 9 mm obstructing renal stone in the distal right ureter, with additional 7 mm obstructing renal stone seen at the right UVJ. 2. Evidence of prior gastric bypass surgery. 3. Small hiatal hernia. 4. No acute or active cardiopulmonary disease. Electronically Signed   By: Virgina Norfolk M.D.   On: 06/14/2019 19:31   DG Chest Port 1 View  Result Date: 06/14/2019 CLINICAL DATA:  Cough. EXAM: PORTABLE CHEST 1 VIEW COMPARISON:  June 05, 2019 FINDINGS: Stable cardiomegaly. The hila and mediastinum are unchanged. No pneumothorax. No nodules or masses. No focal infiltrates or overt edema. IMPRESSION: No active disease. Electronically Signed   By: Dorise Bullion III M.D   On: 06/14/2019 14:50   ECHOCARDIOGRAM COMPLETE  Result Date: 06/15/2019    ECHOCARDIOGRAM REPORT   Patient Name:   Jillian Eaton [place] Date of Exam: 06/15/2019 Medical Rec #:  [id number]                Height:       67.0 in Accession  #:    [id number]               Weight:       204.8 lb Date of Birth:  1950/05/23                BSA:          2.043 m Patient Age:    89 years                 BP:           113/79 mmHg Patient Gender: F  HR:           75 bpm. Exam Location:  Inpatient Procedure: 2D Echo, Cardiac Doppler and Color Doppler Indications:    R06.02 SOB  History:        Patient has prior history of Echocardiogram examinations, most                 recent 12/08/2018.  Sonographer:    Merrie Roof RDCS Referring Phys: [place]  1. Left ventricular ejection fraction, by estimation, is 60 to 65%. The left ventricle has normal function. The left ventricle has no regional wall motion abnormalities. Left ventricular diastolic parameters are indeterminate.  2. Right ventricular systolic function is normal. The right ventricular size is mildly enlarged. There is moderately elevated pulmonary artery systolic pressure. The estimated right ventricular systolic pressure is [id number] mmHg.  3. Left atrial size was moderately dilated.  4. Right atrial size was moderately dilated.  5. The mitral valve is normal in structure. Mild mitral valve regurgitation. No evidence of mitral stenosis.  6. Tricuspid valve regurgitation is moderate.  7. The aortic valve is tricuspid. Aortic valve regurgitation is trivial. Mild aortic valve sclerosis is present, with no evidence of aortic valve stenosis.  8. The inferior vena cava is normal in size with greater than 50% respiratory variability, suggesting right atrial pressure of 3 mmHg. Comparison(s): No significant change from prior study. Prior images reviewed side by side. FINDINGS  Left Ventricle: Left ventricular ejection fraction, by estimation, is 60 to 65%. The left ventricle has normal function. The left ventricle has no regional wall motion abnormalities. The left ventricular internal cavity size was normal in size. There is  no left ventricular hypertrophy. Left  ventricular diastolic parameters are indeterminate. Right Ventricle: The right ventricular size is mildly enlarged. No increase in right ventricular wall thickness. Right ventricular systolic function is normal. There is moderately elevated pulmonary artery systolic pressure. The tricuspid regurgitant velocity is 3.19 m/s, and with an assumed right atrial pressure of 10 mmHg, the estimated right ventricular systolic pressure is [id number] mmHg. Left Atrium: Left atrial size was moderately dilated. Right Atrium: Right atrial size was moderately dilated. Pericardium: There is no evidence of pericardial effusion. Mitral Valve: The mitral valve is normal in structure. Normal mobility of the mitral valve leaflets. Moderate mitral annular calcification. Mild mitral valve regurgitation. No evidence of mitral valve stenosis. Tricuspid Valve: The tricuspid valve is normal in structure. Tricuspid valve regurgitation is moderate . No evidence of tricuspid stenosis. Aortic Valve: The aortic valve is tricuspid. Aortic valve regurgitation is trivial. Mild aortic valve sclerosis is present, with no evidence of aortic valve stenosis. Pulmonic Valve: The pulmonic valve was normal in structure. Pulmonic valve regurgitation is not visualized. No evidence of pulmonic stenosis. Aorta: The aortic root is normal in size and structure. Venous: The inferior vena cava is normal in size with greater than 50% respiratory variability, suggesting right atrial pressure of 3 mmHg. IAS/Shunts: No atrial level shunt detected by color flow Doppler.  LEFT VENTRICLE PLAX 2D LVIDd:         4.40 cm LVIDs:         3.10 cm LV PW:         1.00 cm LV IVS:        0.80 cm LVOT diam:     2.00 cm LV SV:         42 LV SV Index:   21 LVOT Area:     3.14 cm  LV Volumes (MOD) LV vol d, MOD A4C: 45.6 ml LV vol s, MOD A4C: 21.1 ml LV SV MOD A4C:     45.6 ml RIGHT VENTRICLE          IVC RV Basal diam:  4.30 cm  IVC diam: 2.20 cm RV Mid diam:    3.80 cm LEFT ATRIUM              Index       RIGHT ATRIUM           Index LA diam:        3.80 cm 1.86 cm/m  RA Area:     25.50 cm LA Vol (A2C):   81.7 ml 40.00 ml/m RA Volume:   77.10 ml  37.74 ml/m LA Vol (A4C):   99.9 ml 48.90 ml/m LA Biplane Vol: 95.4 ml 46.70 ml/m  AORTIC VALVE LVOT Vmax:   83.60 cm/s LVOT Vmean:  54.200 cm/s LVOT VTI:    0.135 m  AORTA Ao Root diam: 2.90 cm TRICUSPID VALVE TR Peak grad:   40.7 mmHg TR Vmax:        319.00 cm/s  SHUNTS Systemic VTI:  0.14 m Systemic Diam: 2.00 cm Candee Furbish MD Electronically signed by Candee Furbish MD Signature Date/Time: 06/15/2019/5:38:19 PM    Final     Cardiac Studies      Patient Profile     69 y.o. female with history of atrial fibrillation.  Assessment & Plan    Atrial fibrillation: She has well controlled ventricular response at rest but her heart rate accelerates very quickly with any sort of exertion such as getting up to go to the bathroom.  She decompensates fairly quickly.  She has been on anticoagulation.  We have tried to achieve better rate control but she has become hypotensive.  We will ask EP to see her today to consider whether or not she is a candidate for Tikosyn or perhaps an A. fib ablation.        For questions or updates, please contact [place] Please consult [email or web address] for contact info under        Signed, Mertie Moores, MD  06/16/2019, 1:10 PM

## 2019-06-16 NOTE — Progress Notes (Signed)
Pharmacy Review for Dofetilide (Tikosyn) Initiation  Admit Complaint: 69 y.o. female admitted 06/14/2019 with atrial fibrillation to be initiated on dofetilide.   Assessment:  Patient Exclusion Criteria: If any screening criteria checked as "Yes", then  patient  should NOT receive dofetilide until criteria item is corrected. If "Yes" please indicate correction plan.  YES  NO Patient  Exclusion Criteria Correction Plan  [x]  []  Baseline QTc interval is greater than or equal to 440 msec. IF above YES box checked dofetilide contraindicated unless patient has ICD; then may proceed if QTc 500-550 msec or with known ventricular conduction abnormalities may proceed with QTc 550-600 msec. QTc = 0.41   4/24 QTc 444 - reviewed by MD and to continue   [x]  []  Magnesium level is less than 1.8 mEq/l : Last magnesium:  Lab Results  Component Value Date   MG 1.8 06/16/2019     Mg 2g IV x 1 ordered for 1830 + daily Mg oxide 400    [x]  []  Potassium level is less than 4 mEq/l : Last potassium:  Lab Results  Component Value Date   K 3.9 06/16/2019      K 40 mEq x 1 dose ordered   []  [x]  Patient is known or suspected to have a digoxin level greater than 2 ng/ml: No results found for: DIGOXIN  The patient is on digoxin 0.125 mcg per day and per Dr. Lovena Le "Do not suspect or expect dig level to be >2.0 and"   []  []  Creatinine clearance less than 20 ml/min (calculated using Cockcroft-Gault, actual body weight and serum creatinine):  CrCl using total body weight (91 kg) and SCr 1.33 is estimated at 69 ml/min   []  []  Patient has received drugs known to prolong the QT intervals within the last 48 hours (phenothiazines, tricyclics or tetracyclic antidepressants, erythromycin, H-1 antihistamines, cisapride, fluoroquinolones, azithromycin). Drugs not listed above may have an, as yet, undetected potential to prolong the QT interval, updated information on QT prolonging agents is available at this website:QT  prolonging agents   []  []  Patient received a dose of hydrochlorothiazide (Oretic) alone or in any combination including triamterene (Dyazide, Maxzide) in the last 48 hours.   []  []  Patient received a medication known to increase dofetilide plasma concentrations prior to initial dofetilide dose:  . Trimethoprim (Primsol, Proloprim) in the last 36 hours . Verapamil (Calan, Verelan) in the last 36 hours or a sustained release dose in the last 72 hours . Megestrol (Megace) in the last 5 days  . Cimetidine (Tagamet) in the last 6 hours . Ketoconazole (Nizoral) in the last 24 hours . Itraconazole (Sporanox) in the last 48 hours  . Prochlorperazine (Compazine) in the last 36 hours    []  []  Patient is known to have a history of torsades de pointes; congenital or acquired long QT syndromes.   []  []  Patient has received a Class 1 antiarrhythmic with less than 2 half-lives since last dose. (Disopyramide, Quinidine, Procainamide, Lidocaine, Mexiletine, Flecainide, Propafenone)   []  []  Patient has received amiodarone therapy in the past 3 months or amiodarone level is greater than 0.3 ng/ml.    Patient has been appropriately anticoagulated with Apixaban.   Ordering provider was confirmed at [email or web address] if they are not listed on the [place] Prescribers list.  Goal of Therapy: Follow renal function, electrolytes, potential drug interactions, and dose adjustment. Provide education and 1 week supply at discharge.  Plan:  [x]   Physician selected initial dose within range recommended for patients  level of renal function - will monitor for response.  []   Physician selected initial dose outside of range recommended for patients level of renal function - will discuss if the dose should be altered at this time.   Select One Calculated CrCl  Dose q12h  [x]  > 60 ml/min 500 mcg  []  40-60 ml/min 250 mcg  []  20-40 ml/min 125 mcg   2. Follow up QTc after the first 5 doses, renal function,  electrolytes (K & Mg) daily x 3     days, dose adjustment, success of initiation and facilitate 1 week discharge supply as     clinically indicated.  3. Initiate Tikosyn education video (Call [phone] and ask for Tikosyn Video # [phone]).  4. Place Enrollment Form on the chart for discharge supply of dofetilide.  [email or web address]    Alycia Rossetti, PharmD, BCPS Pager: [phone] 6:09 PM

## 2019-06-17 LAB — CBC
HCT: 29.7 % — ABNORMAL LOW (ref 36.0–46.0)
Hemoglobin: 9.1 g/dL — ABNORMAL LOW (ref 12.0–15.0)
MCH: 27.8 pg (ref 26.0–34.0)
MCHC: 30.6 g/dL (ref 30.0–36.0)
MCV: 90.8 fL (ref 80.0–100.0)
Platelets: 290 10*3/uL (ref 150–400)
RBC: 3.27 MIL/uL — ABNORMAL LOW (ref 3.87–5.11)
RDW: 15.9 % — ABNORMAL HIGH (ref 11.5–15.5)
WBC: 4.7 10*3/uL (ref 4.0–10.5)
nRBC: 0 % (ref 0.0–0.2)

## 2019-06-17 LAB — BASIC METABOLIC PANEL
Anion gap: 7 (ref 5–15)
BUN: 11 mg/dL (ref 8–23)
CO2: 28 mmol/L (ref 22–32)
Calcium: 8.3 mg/dL — ABNORMAL LOW (ref 8.9–10.3)
Chloride: 104 mmol/L (ref 98–111)
Creatinine, Ser: 1.28 mg/dL — ABNORMAL HIGH (ref 0.44–1.00)
GFR calc Af Amer: 50 mL/min — ABNORMAL LOW (ref >60)
GFR calc non Af Amer: 43 mL/min — ABNORMAL LOW (ref >60)
Glucose, Bld: 67 mg/dL — ABNORMAL LOW (ref 70–99)
Potassium: 4.5 mmol/L (ref 3.5–5.1)
Sodium: 139 mmol/L (ref 135–145)

## 2019-06-17 LAB — PROTIME-INR
INR: 1.4 — ABNORMAL HIGH (ref 0.8–1.2)
Prothrombin Time: 16.8 seconds — ABNORMAL HIGH (ref 11.4–15.2)

## 2019-06-17 LAB — MAGNESIUM: Magnesium: 2.2 mg/dL (ref 1.7–2.4)

## 2019-06-17 LAB — HAPTOGLOBIN: Haptoglobin: 175 mg/dL (ref 37–355)

## 2019-06-17 MED ORDER — DOFETILIDE 250 MCG PO CAPS: 500.0000 ug | ORAL_CAPSULE | Freq: Two times a day (BID) | ORAL | Status: DC

## 2019-06-17 MED ORDER — DOFETILIDE 250 MCG PO CAPS
250.0000 ug | ORAL_CAPSULE | Freq: Two times a day (BID) | ORAL | Status: DC
  Administered 2019-06-17 – 2019-06-19 (×5): 250 ug via ORAL
  Filled 2019-06-17 (×5): qty 1

## 2019-06-17 NOTE — Progress Notes (Signed)
PROGRESS NOTE  Jillian Eaton VOZ:[id number] DOB: Apr 01, 1950 DOA: 06/14/2019 PCP: [name], MD  Brief Narrative:  69 year old with a history of SBO, HTN, fibromyalgia, obesity, and atrial fibrillation on Xarelto who presented with severe fatigue and unexplained weight loss.  She explains severe shortness of breath and fatigue dating back to March 9, following a knee surgery.  Previous work-ups via her PCP a hematologist and ER visits have been unrevealing.  Significant Events: 4/26 admit via [facility]  Antimicrobials:  None    HPI/Recap of past 24 hours:  She reported having intermittent tachycardia for the last month, sometimes heart rate go up to 140s, she has not been able to take Lopressor as prescribed due to borderline blood pressure at home  She started to have chest tightness feeling dizzy for the last week  This morning She remains in A. fib, rate controlled, blood pressure low normal, no hypoxia at rest Denies chest pain currently    Assessment/Plan: Principal Problem:   Fatigue Active Problems:   Obesity (BMI 30-39.9)   Essential hypertension   Seizures (HCC)   Chronic atrial fibrillation (HCC)   Chronic pain syndrome   Depressive disorder   Chronic diastolic CHF (congestive heart failure) (HCC)   Hypothyroidism   Atrial fibrillation with RVR (HCC)  Fatigue (presenting symptom) -Q25 and folic acid are normal  -a.m. serum cortisol is normal  -Hemoglobin close to baseline at 9.5 -Fatigue likely due to A. 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  Seizure disorder Continue Keppra  Depression Continue home treatment regimen  FTT: reports she is the primary care giver to her husband who has progressive dementia, she recently rehomed her dog of 8years due to declined health, her husband is currently under the care of his family  DVT Prophylaxis: Eliquis  Code Status: Full  Family Communication: patient   Disposition Plan:    Patient came from:            Home                                                                                               Anticipated d/c place:  Pending PT eval, clinical improvement  Barriers to d/c OR conditions which need to be met to effect a safe d/c:  May need cardioversion   Consultants:  Cardiology/EP  Procedures:  None  Antibiotics:  None  Objective: BP 98/81   Pulse (!) 114   Temp (!) 97.3 F (36.3 C) (Oral)   Resp 16   Ht 5' 7"  (1.702 m)   Wt 90.9 kg   SpO2 100%   BMI 31.37 kg/m   Intake/Output Summary (Last 24 hours) at 06/17/2019 1051 Last data filed at 06/17/2019 0834 Gross per 24 hour  Intake 840 ml  Output 400 ml  Net 440 ml   Filed Weights   06/15/19 1607 06/16/19 0519 06/17/19 0104  Weight: 91.3 kg 91 kg 90.9 kg    Exam: Patient is examined daily including today on 06/17/2019, exams remain the same as of yesterday except that has changed    General:  Appear weak, but NAD  Cardiovascular: IRRR  Respiratory: CTABL  Abdomen: Soft/ND/NT, positive BS  Musculoskeletal: No Edema  Neuro: alert, oriented x3   Data Reviewed: Basic Metabolic Panel: Recent Labs  Lab 06/13/19 1429 06/14/19 1430 06/15/19 0640 06/16/19 0555 06/17/19 0437  NA 137 138 137 140 139  K 4.1 4.3 4.0 3.9 4.5  CL 99 101 103 102 104  CO2 29 25 26 29 28   GLUCOSE 82 82 66* 68* 67*  BUN 15 17 11 9 11   CREATININE 1.25* 1.23* 1.26* 1.33* 1.28*  CALCIUM 8.3* 8.3* 8.1* 8.5* 8.3*  MG  --   --   --  1.8 2.2  PHOS  --   --   --  3.2  --    Liver Function Tests: Recent Labs  Lab 06/16/19 0555  AST 13*  ALT 9  ALKPHOS 70  BILITOT 0.6  PROT 6.0*  ALBUMIN 2.1*   No results for input(s): LIPASE, AMYLASE in the last 168 hours. No results for input(s): AMMONIA in the last 168 hours. CBC: Recent Labs  Lab 06/13/19 1639 06/14/19 1430 06/15/19 0640 06/16/19 0555 06/17/19 0437  WBC 8.9 7.0 6.5 4.3 4.7  NEUTROABS  --  5.4  --   --   --   HGB 11.2 9.7* 8.4* 9.5* 9.1*  HCT 36.9 32.0* 27.4* 30.7* 29.7*  MCV 92 91.2 90.4 90.6 90.8  PLT 360 320 276 321 290   Cardiac Enzymes:   No results for input(s): CKTOTAL, CKMB, CKMBINDEX, TROPONINI in the last 168 hours. BNP (last 3 results) Recent Labs    11/27/18 1235 12/06/18 1934 06/14/19 1430  BNP 239.3* 182.3* 141.0*    ProBNP (last 3 results) Recent Labs    06/13/19 1429  PROBNP 251.0*    CBG: No results for input(s): GLUCAP in the last 168 hours.  Recent Results (from the past 240 hour(s))  Respiratory Panel by RT PCR (Flu A&B, Covid) - Nasopharyngeal Swab     Status: None   Collection Time: 06/14/19  8:49 PM   Specimen: Nasopharyngeal Swab  Result Value Ref Range Status   SARS Coronavirus 2 by RT PCR NEGATIVE NEGATIVE Final    Comment: (NOTE) SARS-CoV-2 target nucleic acids are NOT DETECTED. The SARS-CoV-2 RNA is generally detectable in upper respiratoy specimens during the acute phase of infection. The lowest concentration of SARS-CoV-2 viral copies this assay can detect is 131 copies/mL. A negative result does not preclude SARS-Cov-2 infection and  should not be used as the sole basis for treatment or other patient management decisions. A negative result may occur with  improper specimen collection/handling, submission of specimen other than nasopharyngeal swab, presence of viral mutation(s) within the areas targeted by this assay, and inadequate number of viral copies (<131 copies/mL). A negative result  must be combined with clinical observations, patient history, and epidemiological information. The expected result is Negative. Fact Sheet for Patients:  PinkCheek.be Fact Sheet for Healthcare Providers:  GravelBags.it This test is not yet ap proved or cleared by the [place] FDA and  has been authorized for detection and/or diagnosis of SARS-CoV-2 by FDA under an Emergency Use Authorization (EUA). This EUA will remain  in effect (meaning this test can be used) for the duration of the COVID-19 declaration under Section 564(b)(1) of the Act, 21 U.S.C. section 360bbb-3(b)(1), unless the authorization is terminated or revoked sooner.    Influenza A by PCR NEGATIVE NEGATIVE Final   Influenza B by PCR NEGATIVE NEGATIVE Final    Comment: (NOTE) The Xpert Xpress SARS-CoV-2/FLU/RSV assay is intended as an aid in  the diagnosis of influenza from Nasopharyngeal swab specimens and  should not be used as a sole basis for treatment. Nasal washings and  aspirates are unacceptable for Xpert Xpress SARS-CoV-2/FLU/RSV  testing. Fact Sheet for Patients: PinkCheek.be Fact Sheet for Healthcare Providers: GravelBags.it This test is not yet approved or cleared by the [place] FDA and  has been authorized for detection and/or diagnosis of SARS-CoV-2 by  FDA under an Emergency Use Authorization (EUA). This EUA will remain  in effect (meaning this test can be used) for the duration of the  Covid-19 declaration under Section  564(b)(1) of the Act, 21  U.S.C. section 360bbb-3(b)(1), unless the authorization is  terminated or revoked. Performed at [facility], [place] [address] [place], [place] [address]      Studies: No results found.  Scheduled Meds: . apixaban  5 mg Oral BID  . calcitRIOL  0.5 mcg Oral BID  . docusate sodium  100 mg Oral BID  . dofetilide  250 mcg Oral BID  . DULoxetine  60 mg Oral Daily  . feeding supplement (ENSURE ENLIVE)  237 mL Oral TID BM  . fluticasone furoate-vilanterol  1 puff Inhalation Daily  . levETIRAcetam  500 mg Oral Daily  . levothyroxine  150 mcg Oral Q0600  . magnesium oxide  400 mg Oral Daily  . metoprolol succinate  12.5 mg Oral Daily  . multivitamin with minerals  1 tablet Oral Daily  . sodium chloride flush  3 mL Intravenous Q12H  . sodium chloride flush  3 mL Intravenous Q12H  . traZODone  50 mg Oral QHS  . zolpidem  5 mg Oral QHS    Continuous Infusions: . sodium chloride       Time spent: 30mns I have personally reviewed and interpreted on  06/17/2019 daily labs, tele strips, imagings as discussed above under date review session and assessment and plans.  I reviewed all nursing notes, pharmacy notes, consultant notes,  vitals, pertinent old records  I have discussed plan of care as described above with RN , patient  on 06/17/2019   FFlorencia ReasonsMD, PhD, FACP  [facility]  Available via Epic secure chat 7am-7pm for nonurgent issues Please page for urgent issues, pager number available through [email or web address] .   06/17/2019, 10:51 AM  LOS: 2 days

## 2019-06-17 NOTE — Plan of Care (Signed)
  Problem: Education: Goal: Ability to demonstrate management of disease process will improve Outcome: Progressing Goal: Ability to verbalize understanding of medication therapies will improve Outcome: Progressing   Problem: Activity: Goal: Capacity to carry out activities will improve Outcome: Progressing   Problem: Cardiac: Goal: Ability to achieve and maintain adequate cardiopulmonary perfusion will improve Outcome: Progressing   

## 2019-06-17 NOTE — Progress Notes (Addendum)
Progress Note  Patient Name: Jillian Eaton Date of Encounter: 06/17/2019  Primary Cardiologist: Pixie Casino, MD   Subjective   No new c/o  Inpatient Medications    Scheduled Meds: . apixaban  5 mg Oral BID  . calcitRIOL  0.5 mcg Oral BID  . docusate sodium  100 mg Oral BID  . dofetilide  250 mcg Oral BID  . DULoxetine  60 mg Oral Daily  . feeding supplement (ENSURE ENLIVE)  237 mL Oral TID BM  . fluticasone furoate-vilanterol  1 puff Inhalation Daily  . levETIRAcetam  500 mg Oral Daily  . levothyroxine  150 mcg Oral Q0600  . magnesium oxide  400 mg Oral Daily  . metoprolol succinate  12.5 mg Oral Daily  . multivitamin with minerals  1 tablet Oral Daily  . sodium chloride flush  3 mL Intravenous Q12H  . sodium chloride flush  3 mL Intravenous Q12H  . traZODone  50 mg Oral QHS  . zolpidem  5 mg Oral QHS   Continuous Infusions: . sodium chloride     PRN Meds: sodium chloride, acetaminophen **OR** [DISCONTINUED] acetaminophen, albuterol, bisacodyl, cyclobenzaprine, hydrALAZINE, morphine injection, ondansetron **OR** ondansetron (ZOFRAN) IV, polyethylene glycol, sodium chloride flush, zolpidem   Vital Signs    Vitals:   06/16/19 2050 06/17/19 0104 06/17/19 0500 06/17/19 0846  BP: (!) 84/56  (!) 89/61   Pulse: 74  77   Resp: 19  19   Temp: 98.4 F (36.9 C)  98.2 F (36.8 C)   TempSrc: Oral  Oral   SpO2: 99%  97% 98%  Weight:  90.9 kg    Height:        Intake/Output Summary (Last 24 hours) at 06/17/2019 0858 Last data filed at 06/17/2019 0834 Gross per 24 hour  Intake 1180 ml  Output 400 ml  Net 780 ml   Last 3 Weights 06/17/2019 06/16/2019 06/15/2019  Weight (lbs) 200 lb 4.8 oz 200 lb 9.6 oz 201 lb 4.5 oz  Weight (kg) 90.855 kg 90.992 kg 91.3 kg      Telemetry    AFib 60's-70's up to 110's-120s with minimal exertion  - Personally Reviewed  ECG    AFib 75bpm, reviewed with Dr. Lovena Le, manually measured QT 440-479ms, QTc 492-566ms -  Personally Reviewed  Physical Exam   Sitting at bedside getting ready to eat breakfast GEN: No acute distress.   Neck: No JVD Cardiac: irreg-irreg, no murmurs, rubs, or gallops.  Respiratory: CTA b/l. GI: Soft, nontender, non-distended  MS: No edema; No deformity. Neuro:  Nonfocal  Psych: Normal affect   Labs    High Sensitivity Troponin:   Recent Labs  Lab 06/14/19 1430 06/14/19 2049  TROPONINIHS 3 <2      Chemistry Recent Labs  Lab 06/15/19 0640 06/16/19 0555 06/17/19 0437  NA 137 140 139  K 4.0 3.9 4.5  CL 103 102 104  CO2 26 29 28   GLUCOSE 66* 68* 67*  BUN 11 9 11   CREATININE 1.26* 1.33* 1.28*  CALCIUM 8.1* 8.5* 8.3*  PROT  --  6.0*  --   ALBUMIN  --  2.1*  --   AST  --  13*  --   ALT  --  9  --   ALKPHOS  --  70  --   BILITOT  --  0.6  --   GFRNONAA 44* 41* 43*  GFRAA 51* 48* 50*  ANIONGAP 8 9 7      Hematology Recent Labs  Lab 06/15/19 0640 06/15/19 0640 06/16/19 0555 06/16/19 1025 06/17/19 0437  WBC 6.5  --  4.3  --  4.7  RBC 3.03*   < > 3.39* 4.08 3.27*  HGB 8.4*  --  9.5*  --  9.1*  HCT 27.4*  --  30.7*  --  29.7*  MCV 90.4  --  90.6  --  90.8  MCH 27.7  --  28.0  --  27.8  MCHC 30.7  --  30.9  --  30.6  RDW 16.0*  --  15.9*  --  15.9*  PLT 276  --  321  --  290   < > = values in this interval not displayed.    BNP Recent Labs  Lab 06/13/19 1429 06/14/19 1430  BNP  --  141.0*  PROBNP 251.0*  --      DDimer No results for input(s): DDIMER in the last 168 hours.   Radiology      Cardiac Studies   06/15/2019: TTE IMPRESSIONS  1. Left ventricular ejection fraction, by estimation, is 60 to 65%. The  left ventricle has normal function. The left ventricle has no regional  wall motion abnormalities. Left ventricular diastolic parameters are  indeterminate.  2. Right ventricular systolic function is normal. The right ventricular  size is mildly enlarged. There is moderately elevated pulmonary artery  systolic pressure. The  estimated right ventricular systolic pressure is  [id number] mmHg.  3. Left atrial size was moderately dilated. (56mm) 4. Right atrial size was moderately dilated.  5. The mitral valve is normal in structure. Mild mitral valve  regurgitation. No evidence of mitral stenosis.  6. Tricuspid valve regurgitation is moderate.  7. The aortic valve is tricuspid. Aortic valve regurgitation is trivial.  Mild aortic valve sclerosis is present, with no evidence of aortic valve  stenosis.  8. The inferior vena cava is normal in size with greater than 50%  respiratory variability, suggesting right atrial pressure of 3 mmHg.   Comparison(s): No significant change from prior study. Prior images  reviewed side by side.    [place] 2012 FINDINGS: 1. Left main - short, no disease. 2. LAD - no significant disease. 3. Left circumflex, no significant disease. 4. RCA - dominant, no disease, large-caliber vessel. 5. LVEDP = 20 mmHg. 6. RA - 12. 7. RV 38/12. 8. PA - 43/19 (31). 9. PCWP - 24. 10.TPG - 7. 11.Fick cardiac output/Fick cardiac index - 10.56/3.84. 12.Thermodilution cardiac output/thermodilution cardiac index - 6.78/2.47. 13.Aortic saturation - 94%. 14.PA saturation - 68%.  IMPRESSION: 1. No significant obstructive coronary artery disease. 2. LVEDP = 20 mmHg. 3.Borderline pulmonary venous hypertension. 4. High cardiac output.  Patient Profile     69 y.o. female with a hx of fibromyalgia, normal coronaries by cath 2012, kidney stones, p.HTN on home O2, 2018 she had an unintentional narcotic OD resulting  In aspiration pneumonia, echo then noted LVEF 25%, though has had subsequent improvement in LVEF, chronic CHF (diastolic), HTN, hypothyroidism (s/p thyroidectomy 2/2 goiter), morbid obesity (s/p ROUX-EN-Y GASTRIC BYPASS in [place]), Iron def anemia, CKD (III), and Afib (felt to be permanent)   admitted to [facility] 06/14/2019 for exertional palpitations and worsening DOE.  Cardiology  consult noted Chest CT performed in October 2020 shows an enlarged pulmonary artery consistent with pulmonary hypertension. Pulmonary function tests show relatively matched restrictive/obstructive parameters with a total lung capacity of roughly 70% of predicted and commensurate reduction in DLCO  CT chest this admission IMPRESSION: 1. 9 mm obstructing  renal stone in the distal right ureter, with additional 7 mm obstructing renal stone seen at the right UVJ. 2. Evidence of prior gastric bypass surgery. 3. Small hiatal hernia. 4. No acute or active cardiopulmonary disease   She was found to have rate control at rest though with exertion rates rise sharply and associated with SOB.   BP limitingtitration of her BB, dig was added on admission Her anemia thought to play a significant role, as well as her obesity (though noting excellent efforts by the pt in the last couple years with >200lbs lost)   AFib hx Diagnosed April 2019 (while on vacation in [place] hospitalized there w/RVR In 2019 her weight 413 (pt confirmed) >> 200lbs this admission   AAD Hx Amiodarone June 2019, started during a hospital stay July 2019 failed DCCV (shocked 3x)  >> recommended rate control strategy July 2019, amio stopped BP has limited somewhat her diuretics/rate limiting meds  Assessment & Plan    1. AFib (historically felt to be permanent)      CHA2DS2Vasc is 4, on Eliquis, appropriately dosed, patient reports compliance without missed doses for > 3weeks prior to admission       Rates at rest are good though she has RVR with minimal exertiona and associated with palpitations SOB Given her marked weight loss (200lbs) since her last attempt at rhythm control, baseline low BP limiting rate control options Felt reasonable to revisit rhythm control attempt Tikosyn load in progress  K+ 4.5 Mag 2.2 Creat 1.28 (stable), Calc CrCl is 62 QT has lengthened   Reduce Tikosyn to 248mcg this AM, stop  dig  Dr. Lovena Le discussed plans for DCCV tomorrow if not in SR, she is aware and agreeable.  For questions or updates, please contact [place] Please consult [email or web address] for contact info under     Signed, Baldwin Jamaica, PA-C  06/17/2019, 8:58 AM    EP Attending  Patient seen and examined. Agree with the findings as noted above. The patient remains in atrial fib after a single dose of dofetilide. Her QT is out a bit and we will reduce her dose of dofetilide to 250 mcg q12. We will follow her ECG's and electrolytes.  Mikle Bosworth.D.

## 2019-06-17 NOTE — Progress Notes (Signed)
Chaplain engaged in initial visit with Mrs. Jillian Eaton.  Chaplain provided education about Scientist, physiological.  Mrs. Jillian Eaton conveyed wanting guidance around assigning her healthcare agent.  Chaplain and Jillian Eaton had a meaningful discussion around communicating healthcare wants and needs and who would best fit as her healthcare agent.  Mrs. Jillian Eaton has done a great job of having conversations about what she desires.  Chaplain will follow-up to complete AD.

## 2019-06-17 NOTE — Progress Notes (Signed)
Discussed with the patient regarding lab result on Saturday. She is now admitted.

## 2019-06-17 NOTE — TOC Initial Note (Signed)
[facility]) - Initial/Assessment Note    Patient Details  Name: Jillian Eaton MRN: [id number] Date of Birth: 06/26/1950  Transition of Care [facility]) CM/SW Contact:    Zenon Mayo, RN Phone Number: 06/17/2019, 2:53 PM  Clinical Narrative:                 NCM spoke with patient, tykosin will be new for her benefit check in process and [facility] pharmacy will be filling this for her.  NCM asked if she would like to be set up with outpatient pt , she states not at this time but if she is still here by Thursday she may change her mind.  Expected Discharge Plan: Home/Self Care Barriers to Discharge: Continued Medical Work up   Patient Goals and CMS Choice Patient states their goals for this hospitalization and ongoing recovery are:: to get well      Expected Discharge Plan and Services Expected Discharge Plan: Home/Self Care   Discharge Planning Services: CM Consult   Living arrangements for the past 2 months: Single Family Home                                      Prior Living Arrangements/Services Living arrangements for the past 2 months: Single Family Home Lives with:: Spouse Patient language and need for interpreter reviewed:: Yes Do you feel safe going back to the place where you live?: Yes      Need for Family Participation in Patient Care: Yes (Comment) Care giver support system in place?: Yes (comment)   Criminal Activity/Legal Involvement Pertinent to Current Situation/Hospitalization: No - Comment as needed  Activities of Daily Living Home Assistive Devices/Equipment: Cane (specify quad or straight) ADL Screening (condition at time of admission) Patient's cognitive ability adequate to safely complete daily activities?: Yes Is the patient deaf or have difficulty hearing?: No Does the patient have difficulty seeing, even when wearing glasses/contacts?: No Does the patient have difficulty concentrating, remembering, or making  decisions?: No Patient able to express need for assistance with ADLs?: Yes Does the patient have difficulty dressing or bathing?: No Independently performs ADLs?: Yes (appropriate for developmental age) Does the patient have difficulty walking or climbing stairs?: Yes Weakness of Legs: Both Weakness of Arms/Hands: None  Permission Sought/Granted                  Emotional Assessment   Attitude/Demeanor/Rapport: Engaged Affect (typically observed): Appropriate Orientation: : Oriented to Self, Oriented to Place, Oriented to  Time, Oriented to Situation Alcohol / Substance Use: Not Applicable Psych Involvement: No (comment)  Admission diagnosis:  Fatigue [R53.83] Fatigue, unspecified type [R53.83] Atrial fibrillation, unspecified type (HCC) [I48.91] Atrial fibrillation with RVR ([place]) [I48.91] Patient Active Problem List   Diagnosis Date Noted  . Atrial fibrillation with RVR ([place]) 06/15/2019  . Fatigue 06/14/2019  . S/P right TKA 04/29/2019  . Medication management 03/11/2019  . Stiffness of left knee 12/12/2018  . Acute on chronic respiratory failure with hypoxia ([place]) 12/07/2018  . Hypothyroidism 12/07/2018  . Normocytic anemia 12/07/2018  . Asthma 12/07/2018  . S/P left TKA 11/14/2018  . Status post total left knee replacement 11/14/2018  . Degenerative joint disease involving multiple joints on both sides of body 07/31/2018  . Rotator cuff tear arthropathy of right shoulder 04/05/2018  . Overactive bladder 04/05/2018  . Steroid-induced hyperglycemia   . Generalized OA   . S/P  shoulder replacement, right 03/29/2018  . NICM (nonischemic cardiomyopathy) ([place]) 01/15/2018  . Osteoporosis 12/13/2017  . Rotator cuff tear arthropathy 11/29/2017  . Chronic diastolic CHF (congestive heart failure) ([place]) 09/28/2017  . Acute renal failure superimposed on stage 3 chronic kidney disease ([place]) 09/07/2017  . Hypomagnesemia with secondary hypocalcemia 09/07/2017  . 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 Hypercapnemia 07/13/2017  . AKI (acute kidney injury) ([place]) 07/10/2017  . Vertigo 07/08/2017  . Blurring of visual image 07/08/2017  . On anticoagulant therapy 06/19/2017  . Chronic respiratory failure with hypoxia ([place]) 04/19/2017  . GERD (gastroesophageal reflux disease) 08/28/2016  . 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 Depressive disorder 08/28/2016  . Iatrogenic hypocalcemia 08/28/2016  . Chronic pain syndrome   . Acute diastolic (congestive) heart failure ([place])   . Oropharyngeal dysphagia   . Chronic atrial fibrillation ([place]) 04/28/2016  . Vocal cord dysfunction 04/28/2016  . Thrombocytopenia ([place]) 04/28/2016  . Seizures ([place])   . Preoperative cardiovascular examination   . 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  . Leg swelling 10/19/2015  . Acute on chronic respiratory failure with hypoxia and hypercapnia ([place]) 08/03/2015  . Bilateral leg edema 05/11/2015  . Essential hypertension 05/11/2015  . Nephrolithiasis 04/12/2015  . Numbness in both hands 04/12/2015  . Complete tear of left rotator cuff 11/19/2014  . Primary osteoarthritis of both knees 03/24/2014  . Chronic asthmatic bronchitis ([place]) 07/30/2013  . Bariatric surgery status 07/30/2013  . 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 Urge incontinence of urine 07/30/2013  . Vitamin D deficiency 07/30/2013  . B-complex deficiency 07/30/2013  . Cough 07/30/2013  . Gross hematuria 07/30/2013  . Goiter 07/30/2013  . Palpitations 07/30/2013  . Right flank pain 07/30/2013  . SBO (small bowel obstruction) ([place]) 06/07/2013  . Pulmonary HTN ([place]) 01/08/2013  . Insomnia 03/22/2012  . Mild intermittent asthma with acute exacerbation 03/22/2012  . Fibromyalgia 10/26/2011  . Anemia of chronic disease 03/28/2011  . Obesity (BMI 30-39.9) 09/22/2010   PCP:  [name], MD Pharmacy:   [id number] [place], [address] AT [address] [place] [address] Phone: [phone] Fax: [phone]  [facility] [place] [place] [facility] [address] [place] [address] Phone: [phone] Fax: [phone]  [facility] [address], [place] - [place] [address] Phone: [phone] Fax: [phone]  [id number] [place], [address] AT [address] [place] [address] Phone: [phone] Fax: [phone]  [facility] [place], [place] [address] [place] [address] Phone: [phone] Fax: [phone]     Social Determinants of Health (SDOH) Interventions    Readmission Risk Interventions Readmission Risk Prevention Plan 06/17/2019 12/09/2018 05/30/2018  Transportation Screening Complete Complete -  PCP or Specialist Appt within 3-5 Days - Complete Complete  HRI or Home Care Consult - Complete -  Social Work Consult for [place] Planning/Counseling - Complete -  SW consult not completed comments - - -  Palliative Care Screening - Not Applicable -  Medication Review (RN Care Manager) Complete Referral to Pharmacy Referral to Pharmacy  HRI or Home Care Consult Complete - -  SW Recovery Care/Counseling Consult Complete - -  Palliative Care Screening Not Applicable - -  Skilled Nursing Facility Not Applicable - -  Some recent data might be hidden

## 2019-06-17 NOTE — TOC Benefit Eligibility Note (Signed)
Transition of Care [facility]) Benefit Eligibility Note    Patient Details  Name: Kesslyn Silbert MRN: [id number] Date of Birth: [birth year]   Medication/Dose: Tykosin/ Dofetiklide 500mg , 250 mg and 125 mg bid  Covered?: No(has to be preapproved not on formulary)  Tier: Other     Spoke with Person/Company/Phone Number:: Spoke with Georgina Snell [facility] [phone] who transfered me to [facility] with [place] they need to speak to Pharmacy or Doctor  Co-Pay: Unable to get copay until approved through [place]  Prior Approval: Yes          Kerin Salen Phone Number: 06/17/2019, 3:29 PM

## 2019-06-17 NOTE — TOC Benefit Eligibility Note (Signed)
Transition of Care [facility]) Benefit Eligibility Note    Patient Details  Name: Jillian Eaton MRN: [id number] Date of Birth: [birth year]   Medication/Dose: Tykosin/ Dofetiklide 500mg , 250 mg and 125 mg bid  Covered?: No(has to be preapproved not on formulary)  Tier: Other     Spoke with Person/Company/Phone Number:: Spoke with Georgina Snell [facility] [phone] who transfered me to [facility] with [place] they need to speak to Pharmacy or Doctor  Co-Pay: Unable to get copay until approved through [place]  Prior Approval: Yes          Kerin Salen Phone Number: 06/17/2019, 3:29 PM

## 2019-06-17 NOTE — Progress Notes (Signed)
Physical Therapy Treatment Patient Details Name: Jillian Eaton [facility] MRN: [id number] DOB: 1950/08/03 Today's Date: 06/17/2019    History of Present Illness 69 y.o. female with medical history significant of SBO; HTN; fibromyalgia; obesity (BMI 32); PAF on Xarelto; and chronic systolic CHF presenting with fatigue and unexplained weight loss. Problem started after knee surgery on 3/9.    PT Comments    Pt sitting on EoB waiting to take shower agreeable to ambulating with therapy. Pt initially reluctant about Rollator usage but open to try it. Pt limited in safe mobility by SoB and fatigue with increased ambulation distance. Pt able to progress ambulation to 2 bouts of 150 feet with one seated rest break on the Rollator. Pt admits seat is helpful and reduces her anxiety when she gets SoB. D/c plans remain appropriate. PT will continue to follow acutely.    Follow Up Recommendations  Outpatient PT(OPPT for R knee, cardiac rehab if activity tolerance improve)     Equipment Recommendations  Other (comment)(4 wheeled walker with seat)       Precautions / Restrictions Precautions Precautions: None Restrictions Weight Bearing Restrictions: No    Mobility  Bed Mobility               General bed mobility comments: sitting EoB on entry   Transfers Overall transfer level: Modified independent Equipment used: None             General transfer comment: able to stand without assist and self steady  Ambulation/Gait Ambulation/Gait assistance: Modified independent (Device/Increase time) Gait Distance (Feet): 150 Feet(2x150) Assistive device: 4-wheeled walker Gait Pattern/deviations: Step-through pattern;Wide base of support Gait velocity: functional Gait velocity interpretation: >2.62 ft/sec, indicative of community ambulatory General Gait Details: mod I for use of Rollator         Balance Overall balance assessment: Mild deficits observed, not formally tested                                           Cognition Arousal/Alertness: Awake/alert Behavior During Therapy: WFL for tasks assessed/performed Overall Cognitive Status: Within Functional Limits for tasks assessed                                        Exercises General Exercises - Lower Extremity Ankle Circles/Pumps: AROM;Right;Seated;10 reps Quad Sets: AROM;Right;10 reps;Seated Long Arc Quad: AROM;Left;10 reps;Seated    General Comments General comments (skin integrity, edema, etc.): Max HR 122bpm, SaO2 after ambulation on RA 95%O2       Pertinent Vitals/Pain Pain Assessment: No/denies pain           PT Goals (current goals can now be found in the care plan section) Acute Rehab PT Goals Patient Stated Goal: to go home PT Goal Formulation: With patient Time For Goal Achievement: 06/29/19 Potential to Achieve Goals: Good Progress towards PT goals: Progressing toward goals    Frequency    Min 3X/week      PT Plan Current plan remains appropriate       AM-PAC PT "6 Clicks" Mobility   Outcome Measure  Help needed turning from your back to your side while in a flat bed without using bedrails?: None Help needed moving from lying on your back to sitting on the side of a flat bed without using bedrails?: None  Help needed moving to and from a bed to a chair (including a wheelchair)?: None Help needed standing up from a chair using your arms (e.g., wheelchair or bedside chair)?: None Help needed to walk in hospital room?: None Help needed climbing 3-5 steps with a railing? : A Lot 6 Click Score: 22    End of Session   Activity Tolerance: Patient tolerated treatment well Patient left: in bed;with call bell/phone within reach Nurse Communication: Mobility status PT Visit Diagnosis: Other abnormalities of gait and mobility (R26.89)     Time: 4098-1191 PT Time Calculation (min) (ACUTE ONLY): 33 min  Charges:  $Gait Training: 8-22  mins $Therapeutic Exercise: 8-22 mins                     Alamin Mccuiston B. Beverely Risen PT, DPT Acute Rehabilitation Services Pager [phone] Office [phone]    Elon Alas Fleet 06/17/2019, 4:40 PM

## 2019-06-18 ENCOUNTER — Inpatient Hospital Stay (HOSPITAL_COMMUNITY): Payer: Medicare Other | Admitting: Certified Registered Nurse Anesthetist

## 2019-06-18 ENCOUNTER — Encounter (HOSPITAL_COMMUNITY): Payer: Self-pay | Admitting: Internal Medicine

## 2019-06-18 ENCOUNTER — Encounter (HOSPITAL_COMMUNITY)
Admission: EM | Disposition: A | Discharge status: Home / Self Care | Payer: Self-pay | Source: Home / Self Care | Attending: Internal Medicine

## 2019-06-18 HISTORY — PX: CARDIOVERSION: SHX1299

## 2019-06-18 LAB — BASIC METABOLIC PANEL
Anion gap: 7 (ref 5–15)
BUN: 13 mg/dL (ref 8–23)
CO2: 27 mmol/L (ref 22–32)
Calcium: 8.4 mg/dL — ABNORMAL LOW (ref 8.9–10.3)
Chloride: 106 mmol/L (ref 98–111)
Creatinine, Ser: 1.48 mg/dL — ABNORMAL HIGH (ref 0.44–1.00)
GFR calc Af Amer: 42 mL/min — ABNORMAL LOW (ref >60)
GFR calc non Af Amer: 36 mL/min — ABNORMAL LOW (ref >60)
Glucose, Bld: 64 mg/dL — ABNORMAL LOW (ref 70–99)
Potassium: 4.5 mmol/L (ref 3.5–5.1)
Sodium: 140 mmol/L (ref 135–145)

## 2019-06-18 LAB — MAGNESIUM: Magnesium: 2.1 mg/dL (ref 1.7–2.4)

## 2019-06-18 SURGERY — CARDIOVERSION
Anesthesia: General

## 2019-06-18 MED ORDER — SODIUM CHLORIDE 0.9 % IV SOLN: INTRAVENOUS | Status: DC | PRN

## 2019-06-18 MED ORDER — PROPOFOL 10 MG/ML IV BOLUS
INTRAVENOUS | Status: DC | PRN
  Administered 2019-06-18: 80 mg via INTRAVENOUS

## 2019-06-18 MED ORDER — LIDOCAINE 2% (20 MG/ML) 5 ML SYRINGE
INTRAMUSCULAR | Status: DC | PRN
  Administered 2019-06-18: 20 mg via INTRAVENOUS

## 2019-06-18 MED ORDER — HYDROCORTISONE 1 % EX CREA
1.0000 "application " | TOPICAL_CREAM | Freq: Three times a day (TID) | CUTANEOUS | Status: DC | PRN
  Filled 2019-06-18: qty 28

## 2019-06-18 NOTE — Transfer of Care (Signed)
Immediate Anesthesia Transfer of Care Note  Patient: Jillian Eaton  Procedure(s) Performed: CARDIOVERSION (N/A )  Patient Location: Endoscopy Unit  Anesthesia Type:General  Level of Consciousness: awake, alert , oriented, patient cooperative and responds to stimulation  Airway & Oxygen Therapy: Patient Spontanous Breathing and Patient connected to nasal cannula oxygen  Post-op Assessment: Report given to RN and Post -op Vital signs reviewed and stable  Post vital signs: Reviewed and stable  Last Vitals:  Vitals Value Taken Time  BP    Temp    Pulse 66 06/18/19 0911  Resp 21 06/18/19 0911  SpO2 100 % 06/18/19 0911  Vitals shown include unvalidated device data.  Last Pain:  Vitals:   06/18/19 0817  TempSrc: Oral  PainSc: 0-No pain         Complications: No apparent anesthesia complications

## 2019-06-18 NOTE — Consult Note (Signed)
[facility] CM Inpatient Consult   06/18/2019  Shadava Clarizio [facility] 1950/10/30 [id number]   Patient screened for extreme high risk score for unplanned readmission and hospitalizations.  Patient is in the Medicare [employer] Accountable Care Organization and has had a history with [facility] Care Management team in the past and her Primary Care Provider [PCP] is listed with Mary-Margaret Daphine Deutscher, FNP last visit 2020.    However, current primary care provider is listed as Psychologist, clinical, International aid/development worker, [facility] is not currently listed in the [employer]   Chart review reveals patient is post procedure.  Plan:  To follow up with patient on who PCP is currently. Follow up with inpatient TOC team Continue to follow progress and disposition to assess for post hospital care management needs.    Please place a [facility] Care Management consult as appropriate and for questions contact:   Charlesetta Shanks, RN BSN CCM [facility]  [phone] business mobile phone Toll free office [phone]  Fax number: [phone] [email or web address]

## 2019-06-18 NOTE — Progress Notes (Signed)
Unfortunately despite multiple shocks she remains in AFib, though rate controlled and overall better rate control. She feels better D/w dr. Lovena Le, we will complete load process EKG reviewed (though done very late) with stable QT, tele reviewed Cost is acceptable to the patient Discussed plan, she is agreeable.  Continue Tikosyn tonight Anticipate discharge tomorrow after her 6th dose  Tommye Standard, PA-C

## 2019-06-18 NOTE — Progress Notes (Signed)
Occupational Therapy Treatment + Dicharge Patient Details Name: Jillian Eaton [facility] MRN: [id number] DOB: 1951/02/02 Today's Date: 06/18/2019    History of present illness 69 y.o. female with medical history significant of SBO; HTN; fibromyalgia; obesity (BMI 32); PAF on Xarelto; and chronic systolic CHF presenting with fatigue and unexplained weight loss. Problem started after knee surgery on 3/9.   OT comments  Pt continues to fatigue easily. OTR focusing on E conservation techniques this session. Pt not interested in learning about LB AE at this time as she  knows it "takes me longer, but I can do it." Pt already has reacher at home so use of reacher education performed for successful LB ADL. Pt/OT discussion of OT goals and both agree that pt has met them.  O2 >90% on RA; HR 80-90 BPM. OT signing off.    Follow Up Recommendations  No OT follow up;Supervision - Intermittent    Equipment Recommendations  3 in 1 bedside commode    Recommendations for Other Services      Precautions / Restrictions Precautions Precautions: None Restrictions Weight Bearing Restrictions: No       Mobility Bed Mobility               General bed mobility comments: Pt bed level for session  Transfers                 General transfer comment: education performed    Balance Overall balance assessment: Mild deficits observed, not formally tested                                         ADL either performed or assessed with clinical judgement   ADL Overall ADL's : Needs assistance/impaired Eating/Feeding: Supervision/ safety;Sitting                                   Functional mobility during ADLs: Supervision/safety;Rolling walker General ADL Comments: Pt continues to fatigue easily. OTR focusing on E conservation techniques this session. Pt not interested in learning about LB AE at this time as she  knows it "takes me longer, but I can do it."  Pt already has reacher at home so use of reacher education performed for successful LB ADL.      Vision   Vision Assessment?: No apparent visual deficits   Perception     Praxis      Cognition Arousal/Alertness: Awake/alert Behavior During Therapy: WFL for tasks assessed/performed Overall Cognitive Status: Within Functional Limits for tasks assessed                                          Exercises     Shoulder Instructions       General Comments O2 >90% on RA; HR 80-90 BPM. Energy conservation strategy thoroughly performed.    Pertinent Vitals/ Pain       Pain Assessment: No/denies pain  Home Living                                          Prior Functioning/Environment  Frequency  Min 2X/week        Progress Toward Goals  OT Goals(current goals can now be found in the care plan section)  Progress towards OT goals: Progressing toward goals  Acute Rehab OT Goals Patient Stated Goal: to go home OT Goal Formulation: With patient Time For Goal Achievement: 06/30/19 Potential to Achieve Goals: Good ADL Goals Pt Will Perform Lower Body Dressing: sitting/lateral leans;sit to/from stand;with adaptive equipment;with set-up Additional ADL Goal #1: Pt will recall 3 energy conservation techniques to use here and at home for increasing activity tolerance.  Plan All goals met and education completed, patient discharged from OT services    Co-evaluation                 AM-PAC OT "6 Clicks" Daily Activity     Outcome Measure   Help from another person eating meals?: None Help from another person taking care of personal grooming?: None Help from another person toileting, which includes using toliet, bedpan, or urinal?: None Help from another person bathing (including washing, rinsing, drying)?: A Little Help from another person to put on and taking off regular upper body clothing?: None Help from another  person to put on and taking off regular lower body clothing?: A Little 6 Click Score: 22    End of Session    OT Visit Diagnosis: Muscle weakness (generalized) (M62.81)   Activity Tolerance Patient tolerated treatment well   Patient Left in bed;with call bell/phone within reach   Nurse Communication Mobility status        Time: 1610-9604 OT Time Calculation (min): 21 min  Charges: OT General Charges $OT Visit: 1 Visit OT Treatments $Therapeutic Activity: 8-22 mins  Flora Lipps, OTR/L Acute Rehabilitation Services Pager: [phone] Office: [phone]    Jillian Eaton  C 06/18/2019, 4:59 PM

## 2019-06-18 NOTE — Anesthesia Postprocedure Evaluation (Signed)
Anesthesia Post Note  Patient: Jillian Eaton  Procedure(s) Performed: CARDIOVERSION (N/A )     Patient location during evaluation: Endoscopy Anesthesia Type: General Level of consciousness: awake and alert, oriented and patient cooperative Pain management: pain level controlled Respiratory status: spontaneous breathing, nonlabored ventilation and respiratory function stable Cardiovascular status: blood pressure returned to baseline and stable Postop Assessment: no apparent nausea or vomiting Anesthetic complications: no    Last Vitals:  Vitals:   06/18/19 0920 06/18/19 0922  BP: 111/72 (!) 98/58  Pulse: 64 65  Resp: (!) 22 20  Temp:    SpO2: 96% 99%    Last Pain:  Vitals:   06/18/19 0922  TempSrc:   PainSc: 0-No pain                 Sophonie Goforth,E. Offie Pickron

## 2019-06-18 NOTE — Plan of Care (Signed)
  Problem: Education: Goal: Ability to demonstrate management of disease process will improve Outcome: Progressing Goal: Ability to verbalize understanding of medication therapies will improve Outcome: Progressing   Problem: Activity: Goal: Capacity to carry out activities will improve Outcome: Progressing   Problem: Cardiac: Goal: Ability to achieve and maintain adequate cardiopulmonary perfusion will improve Outcome: Progressing   

## 2019-06-18 NOTE — Anesthesia Preprocedure Evaluation (Signed)
Anesthesia Evaluation  Patient identified by MRN, date of birth, ID band Patient awake    Reviewed: Allergy & Precautions, NPO status , Patient's Chart, lab work & pertinent test results  History of Anesthesia Complications Negative for: history of anesthetic complications  Airway Mallampati: II  TM Distance: >3 FB Neck ROM: Full    Dental  (+) Upper Dentures, Dental Advisory Given   Pulmonary COPD,  COPD inhaler,    breath sounds clear to auscultation       Cardiovascular hypertension, Pt. on medications and Pt. on home beta blockers + DOE  + dysrhythmias Atrial Fibrillation + Valvular Problems/Murmurs (mild-mod TR)  Rhythm:Irregular Rate:Normal  06/15/2019 ECHO; EF 60-65%, mild MR, mod TR   Neuro/Psych  Headaches, Seizures -, Well Controlled,  Depression    GI/Hepatic GERD  Controlled,  Endo/Other  Hypothyroidism   Renal/GU Renal InsufficiencyRenal disease (creat 1.48)     Musculoskeletal   Abdominal   Peds  Hematology  (+) Blood dyscrasia (Hb 9.1), anemia , eliquis   Anesthesia Other Findings   Reproductive/Obstetrics                             Anesthesia Physical Anesthesia Plan  ASA: III  Anesthesia Plan: General   Post-op Pain Management:    Induction: Intravenous  PONV Risk Score and Plan: 3 and Treatment may vary due to age or medical condition  Airway Management Planned: Natural Airway and Mask  Additional Equipment:   Intra-op Plan:   Post-operative Plan:   Informed Consent: I have reviewed the patients History and Physical, chart, labs and discussed the procedure including the risks, benefits and alternatives for the proposed anesthesia with the patient or authorized representative who has indicated his/her understanding and acceptance.     Dental advisory given  Plan Discussed with: CRNA and Surgeon  Anesthesia Plan Comments:         Anesthesia Quick  Evaluation

## 2019-06-18 NOTE — Progress Notes (Signed)
PROGRESS NOTE  Cedrica Brune Adeyemi RCV:[id number] DOB: 1950/11/24 DOA: 06/14/2019 PCP: [name], MD  Brief Narrative:  69 year old with a history of SBO, HTN, fibromyalgia, obesity, and atrial fibrillation on Xarelto who presented with severe fatigue and unexplained weight loss.  She explains severe shortness of breath and fatigue dating back to March 9, following a knee surgery.  Previous work-ups via her PCP a hematologist and ER visits have been unrevealing.  She reported having intermittent tachycardia for the last month, sometimes heart rate go up to 140s, she has not been able to take Lopressor as prescribed due to borderline blood pressure at home  She started to have chest tightness feeling dizzy for the last week   Significant Events: 4/26 admit via [facility]  Antimicrobials:  None    HPI/Recap of past 24 hours:  She is seen after return from cardioversion, remains  in A. fib, rate controlled, She is frustrated due to poor tolerance to light activity, reports heart rate went up to 160's briefly after getting out of bed to chair  blood pressure low normal, no hypoxia at rest Denies chest pain currently    Assessment/Plan: Principal Problem:   Fatigue Active Problems:   Obesity (BMI 30-39.9)   Essential hypertension   Seizures (HCC)   Chronic atrial fibrillation (HCC)   Chronic pain syndrome   Depressive disorder   Chronic diastolic CHF (congestive heart failure) (HCC)   Hypothyroidism   Atrial fibrillation with RVR (HCC)  Fatigue (presenting symptom) -Z02 and folic acid are normal  -a.m. serum cortisol is normal  -Hemoglobin close to baseline at 9.5 -Fatigue likely due to A. 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  Seizure disorder Continue Keppra  Depression Continue home treatment regimen  FTT: reports she is the primary care giver to her husband who has progressive dementia, she recently rehomed her dog of 8years due to declined health, her husband is currently under the care of his family  DVT Prophylaxis: Eliquis  Code Status: Full  Family Communication: patient   Disposition Plan:    Patient came from:            Home                                                                                               Anticipated d/c place:  Pending PT eval, clinical improvement  Barriers to d/c OR conditions which need to be met to effect a safe d/c:  Need cardiology clearance   Consultants:  Cardiology/EP  Procedures:  Unsuccessful cardioversion on April 28  Antibiotics:  None   Objective: BP (!) 89/73 (BP Location: Left Arm)   Pulse 63   Temp 98.2 F (36.8 C) (Oral)   Resp 20   Ht _0  (1.702 m)   Wt 90.4 kg   SpO2 99%   BMI 31.20 kg/m   Intake/Output Summary (Last 24 hours) at 06/18/2019 1819 Last data filed at 06/18/2019 1300 Gross per 24 hour  Intake 520 ml  Output 750 ml  Net -230 ml   Filed Weights   06/16/19 0519 06/17/19 0104 06/18/19 0631  Weight: 91 kg 90.9 kg 90.4 kg    Exam: Patient is examined daily including today on 06/18/2019, exams  remain the same as of yesterday except that has changed    General:  Appear weak, but NAD  Cardiovascular: IRRR  Respiratory: CTABL  Abdomen: Soft/ND/NT, positive BS  Musculoskeletal: No Edema  Neuro: alert, oriented x3  Data Reviewed: Basic Metabolic Panel: Recent Labs  Lab 06/14/19 1430 06/15/19 0640 06/16/19 0555 06/17/19 0437 06/18/19 0645  NA 138 137 140 139 140  K 4.3 4.0 3.9 4.5 4.5  CL 101 103 102 104 106  CO2 _1 GLUCOSE 82 66* 68* 67* 64*  BUN _2 CREATININE 1.23* 1.26* 1.33* 1.28* 1.48*  CALCIUM 8.3* 8.1* 8.5* 8.3* 8.4*  MG  --   --  1.8 2.2 2.1  PHOS  --   --  3.2  --   --    Liver Function Tests: Recent Labs  Lab 06/16/19 0555  AST 13*  ALT 9  ALKPHOS 70  BILITOT 0.6  PROT 6.0*  ALBUMIN 2.1*   No results for input(s): LIPASE, AMYLASE in the last 168 hours. No results for input(s): AMMONIA in the last 168 hours. CBC: Recent Labs  Lab 06/13/19 1639 06/14/19 1430 06/15/19 0640 06/16/19 0555 06/17/19 0437  WBC 8.9 7.0 6.5 4.3 4.7  NEUTROABS  --  5.4  --   --   --   HGB 11.2 9.7* 8.4* 9.5* 9.1*  HCT 36.9 32.0* 27.4* 30.7* 29.7*  MCV 92 91.2 90.4 90.6 90.8  PLT 360 320 276 321 290   Cardiac Enzymes:   No results for input(s): CKTOTAL, CKMB, CKMBINDEX, TROPONINI in the last 168 hours. BNP (last 3 results) Recent Labs    11/27/18 1235 12/06/18 1934 06/14/19 1430  BNP 239.3* 182.3* 141.0*    ProBNP (last 3 results) Recent Labs    06/13/19 1429  PROBNP 251.0*    CBG: No results for input(s): GLUCAP in the last 168 hours.  Recent Results (from the past 240 hour(s))  Respiratory Panel by RT PCR (Flu A&B, Covid) - Nasopharyngeal Swab     Status: None   Collection Time: 06/14/19  8:49 PM   Specimen: Nasopharyngeal Swab  Result Value Ref Range Status   SARS Coronavirus 2 by RT PCR NEGATIVE NEGATIVE Final    Comment: (NOTE) SARS-CoV-2 target nucleic acids are NOT DETECTED. The SARS-CoV-2 RNA is generally  detectable in upper respiratoy specimens during the acute phase of infection. The lowest concentration of SARS-CoV-2 viral copies this assay can detect is 131 copies/mL. A negative result does not preclude SARS-Cov-2 infection and should not be used as the sole basis for treatment or other patient management decisions.  A negative result may occur with  improper specimen collection/handling, submission of specimen other than nasopharyngeal swab, presence of viral mutation(s) within the areas targeted by this assay, and inadequate number of viral copies (<131 copies/mL). A negative result must be combined with clinical observations, patient history, and epidemiological information. The expected result is Negative. Fact Sheet for Patients:  PinkCheek.be Fact Sheet for Healthcare Providers:  GravelBags.it This test is not yet ap proved or cleared by the [place] FDA and  has been authorized for detection and/or diagnosis of SARS-CoV-2 by FDA under an Emergency Use Authorization (EUA). This EUA will remain  in effect (meaning this test can be used) for the duration of the COVID-19 declaration under Section 564(b)(1) of the Act, 21 U.S.C. section 360bbb-3(b)(1), unless the authorization is terminated or revoked sooner.    Influenza A by PCR NEGATIVE NEGATIVE Final   Influenza B by PCR NEGATIVE NEGATIVE Final    Comment: (NOTE) The Xpert Xpress SARS-CoV-2/FLU/RSV assay is intended as an aid in  the diagnosis of influenza from Nasopharyngeal swab specimens and  should not be used as a sole basis for treatment. Nasal washings and  aspirates are unacceptable for Xpert Xpress SARS-CoV-2/FLU/RSV  testing. Fact Sheet for Patients: PinkCheek.be Fact Sheet for Healthcare Providers: GravelBags.it This test is not yet approved or cleared by the [place] FDA and  has been  authorized for detection and/or diagnosis of SARS-CoV-2 by  FDA under an Emergency Use Authorization (EUA). This EUA will remain  in effect (meaning this test can be used) for the duration of the  Covid-19 declaration under Section 564(b)(1) of the Act, 21  U.S.C. section 360bbb-3(b)(1), unless the authorization is  terminated or revoked. Performed at [facility], [place] [address] [place], [place] [address]      Studies: No results found.  Scheduled Meds: . apixaban  5 mg Oral BID  . calcitRIOL  0.5 mcg Oral BID  . docusate sodium  100 mg Oral BID  . dofetilide  250 mcg Oral BID  . DULoxetine  60 mg Oral Daily  . feeding supplement (ENSURE ENLIVE)  237 mL Oral TID BM  . fluticasone furoate-vilanterol  1 puff Inhalation Daily  . levETIRAcetam  500 mg Oral Daily  . levothyroxine  150 mcg Oral Q0600  . magnesium oxide  400 mg Oral Daily  . metoprolol succinate  12.5 mg Oral Daily  . multivitamin with minerals  1 tablet Oral Daily  . sodium chloride flush  3 mL Intravenous Q12H  . sodium chloride flush  3 mL Intravenous Q12H  . traZODone  50 mg Oral QHS  . zolpidem  5 mg Oral QHS    Continuous Infusions: . sodium chloride       Time spent: 41mns I have personally reviewed and interpreted on  06/18/2019 daily labs, tele strips, imagings as discussed above under date review session and assessment and plans.  I reviewed all nursing notes, pharmacy notes, consultant notes,  vitals, pertinent old records  I have discussed plan of care as described above with RN , patient  on 06/18/2019   FFlorencia ReasonsMD, PhD, FACP  [facility]  Available via Epic secure chat 7am-7pm for nonurgent issues Please page for urgent issues, pager number available through [email or web address] .   06/18/2019, 6:19 PM  LOS: 3 days

## 2019-06-18 NOTE — Progress Notes (Addendum)
Progress Note  Patient Name: Jillian Eaton [facility] Date of Encounter: 06/18/2019  Primary Cardiologist: Pixie Casino, MD   Subjective   No new c/o, hopeful we will gain SR  Inpatient Medications    Scheduled Meds: . apixaban  5 mg Oral BID  . calcitRIOL  0.5 mcg Oral BID  . docusate sodium  100 mg Oral BID  . dofetilide  250 mcg Oral BID  . DULoxetine  60 mg Oral Daily  . feeding supplement (ENSURE ENLIVE)  237 mL Oral TID BM  . fluticasone furoate-vilanterol  1 puff Inhalation Daily  . levETIRAcetam  500 mg Oral Daily  . levothyroxine  150 mcg Oral Q0600  . magnesium oxide  400 mg Oral Daily  . metoprolol succinate  12.5 mg Oral Daily  . multivitamin with minerals  1 tablet Oral Daily  . sodium chloride flush  3 mL Intravenous Q12H  . sodium chloride flush  3 mL Intravenous Q12H  . traZODone  50 mg Oral QHS  . zolpidem  5 mg Oral QHS   Continuous Infusions: . sodium chloride     PRN Meds: sodium chloride, acetaminophen **OR** [DISCONTINUED] acetaminophen, albuterol, bisacodyl, cyclobenzaprine, hydrALAZINE, morphine injection, ondansetron **OR** ondansetron (ZOFRAN) IV, polyethylene glycol, sodium chloride flush, zolpidem   Vital Signs    Vitals:   06/17/19 0935 06/17/19 1140 06/17/19 2056 06/18/19 0631  BP: 98/81 91/61 (!) 86/53 91/65  Pulse:  70 70 60  Resp: 16 16 19 19   Temp:  98.1 F (36.7 C) 98.4 F (36.9 C) 98.5 F (36.9 C)  TempSrc:  Oral Oral Oral  SpO2: 100% 99% 98% 100%  Weight:    90.4 kg  Height:        Intake/Output Summary (Last 24 hours) at 06/18/2019 0806 Last data filed at 06/18/2019 0400 Gross per 24 hour  Intake 1020 ml  Output 400 ml  Net 620 ml   Last 3 Weights 06/18/2019 06/17/2019 06/16/2019  Weight (lbs) 199 lb 3.2 oz 200 lb 4.8 oz 200 lb 9.6 oz  Weight (kg) 90.357 kg 90.855 kg 90.992 kg      Telemetry    AFib 60's-70's   - Personally Reviewed  ECG    AFib 64bpm, reviewed with Dr. Lovena Le, QTc stable - Personally  Reviewed  Physical Exam   Sitting at bedside getting ready to to go for DCCV GEN: No acute distress.   Neck: No JVD Cardiac: irreg-irreg, no murmurs, rubs, or gallops.  Respiratory: CTA b/l. GI: Soft, nontender, non-distended  MS: No edema; No deformity. Neuro:  Nonfocal  Psych: Normal affect   Labs    High Sensitivity Troponin:   Recent Labs  Lab 06/14/19 1430 06/14/19 2049  TROPONINIHS 3 <2      Chemistry Recent Labs  Lab 06/15/19 0640 06/16/19 0555 06/17/19 0437  NA 137 140 139  K 4.0 3.9 4.5  CL 103 102 104  CO2 26 29 28   GLUCOSE 66* 68* 67*  BUN 11 9 11   CREATININE 1.26* 1.33* 1.28*  CALCIUM 8.1* 8.5* 8.3*  PROT  --  6.0*  --   ALBUMIN  --  2.1*  --   AST  --  13*  --   ALT  --  9  --   ALKPHOS  --  70  --   BILITOT  --  0.6  --   GFRNONAA 44* 41* 43*  GFRAA 51* 48* 50*  ANIONGAP 8 9 7      Hematology Recent Labs  Lab 06/15/19 0640 06/15/19 0640 06/16/19 0555 06/16/19 1025 06/17/19 0437  WBC 6.5  --  4.3  --  4.7  RBC 3.03*   < > 3.39* 4.08 3.27*  HGB 8.4*  --  9.5*  --  9.1*  HCT 27.4*  --  30.7*  --  29.7*  MCV 90.4  --  90.6  --  90.8  MCH 27.7  --  28.0  --  27.8  MCHC 30.7  --  30.9  --  30.6  RDW 16.0*  --  15.9*  --  15.9*  PLT 276  --  321  --  290   < > = values in this interval not displayed.    BNP Recent Labs  Lab 06/13/19 1429 06/14/19 1430  BNP  --  141.0*  PROBNP 251.0*  --      DDimer No results for input(s): DDIMER in the last 168 hours.   Radiology      Cardiac Studies   06/15/2019: TTE IMPRESSIONS  1. Left ventricular ejection fraction, by estimation, is 60 to 65%. The  left ventricle has normal function. The left ventricle has no regional  wall motion abnormalities. Left ventricular diastolic parameters are  indeterminate.  2. Right ventricular systolic function is normal. The right ventricular  size is mildly enlarged. There is moderately elevated pulmonary artery  systolic pressure. The estimated  right ventricular systolic pressure is  [id number] mmHg.  3. Left atrial size was moderately dilated. (78mm) 4. Right atrial size was moderately dilated.  5. The mitral valve is normal in structure. Mild mitral valve  regurgitation. No evidence of mitral stenosis.  6. Tricuspid valve regurgitation is moderate.  7. The aortic valve is tricuspid. Aortic valve regurgitation is trivial.  Mild aortic valve sclerosis is present, with no evidence of aortic valve  stenosis.  8. The inferior vena cava is normal in size with greater than 50%  respiratory variability, suggesting right atrial pressure of 3 mmHg.   Comparison(s): No significant change from prior study. Prior images  reviewed side by side.    [place] 2012 FINDINGS: 1. Left main - short, no disease. 2. LAD - no significant disease. 3. Left circumflex, no significant disease. 4. RCA - dominant, no disease, large-caliber vessel. 5. LVEDP = 20 mmHg. 6. RA - 12. 7. RV 38/12. 8. PA - 43/19 (31). 9. PCWP - 24. 10.TPG - 7. 11.Fick cardiac output/Fick cardiac index - 10.56/3.84. 12.Thermodilution cardiac output/thermodilution cardiac index - 6.78/2.47. 13.Aortic saturation - 94%. 14.PA saturation - 68%.  IMPRESSION: 1. No significant obstructive coronary artery disease. 2. LVEDP = 20 mmHg. 3.Borderline pulmonary venous hypertension. 4. High cardiac output.  Patient Profile     69 y.o. female with a hx of fibromyalgia, normal coronaries by cath 2012, kidney stones, p.HTN on home O2, 2018 she had an unintentional narcotic OD resulting  In aspiration pneumonia, echo then noted LVEF 25%, though has had subsequent improvement in LVEF, chronic CHF (diastolic), HTN, hypothyroidism (s/p thyroidectomy 2/2 goiter), morbid obesity (s/p ROUX-EN-Y GASTRIC BYPASS in [place]), Iron def anemia, CKD (III), and Afib (felt to be permanent)   admitted to [facility] 06/14/2019 for exertional palpitations and worsening DOE.  Cardiology consult  noted Chest CT performed in October 2020 shows an enlarged pulmonary artery consistent with pulmonary hypertension. Pulmonary function tests show relatively matched restrictive/obstructive parameters with a total lung capacity of roughly 70% of predicted and commensurate reduction in DLCO  CT chest this admission IMPRESSION: 1. 9 mm obstructing  renal stone in the distal right ureter, with additional 7 mm obstructing renal stone seen at the right UVJ. 2. Evidence of prior gastric bypass surgery. 3. Small hiatal hernia. 4. No acute or active cardiopulmonary disease   She was found to have rate control at rest though with exertion rates rise sharply and associated with SOB.   BP limitingtitration of her BB, dig was added on admission Her anemia thought to play a significant role, as well as her obesity (though noting excellent efforts by the pt in the last couple years with >200lbs lost)   AFib hx Diagnosed April 2019 (while on vacation in [place]), hospitalized there w/RVR In 2019 her weight 413 (pt confirmed) >> 200lbs this admission   AAD Hx Amiodarone June 2019, started during a hospital stay July 2019 failed DCCV (shocked 3x)  >> recommended rate control strategy July 2019, amio stopped BP has limited somewhat her diuretics/rate limiting meds  Assessment & Plan    1. AFib (historically felt to be permanent)     CHA2DS2Vasc is 4, on Eliquis, appropriately dosed, patient reports compliance without missed doses for > 3weeks prior to admission      Labs this AM are pending      EKG is reviewed with Dr. Lovena Le, QT stable        Tikosyn load in progress  DCCV this AM Held metoprolol this AM for DCCV, see her sinus rate, allow better BP for procedure  Will call for PA today, hopefully is affordable   For questions or updates, please contact [facility] Please consult [email or web address] for contact info under     Signed, Baldwin Jamaica, PA-C  06/18/2019, 8:06 AM     EP Attending  Patient seen and examined. Agree with above. The patient is stable this morning. She remains in rate controlled atrial fib. Her QT is satisfactory. She will undergo DCCV.   Mikle Bosworth.D.

## 2019-06-18 NOTE — CV Procedure (Signed)
   CARDIOVERSION NOTE  Procedure: Electrical Cardioversion Indications:  Atrial Fibrillation  Procedure Details:  Consent: Risks of procedure as well as the alternatives and risks of each were explained to the (patient/caregiver).  Consent for procedure obtained.  Time Out: Verified patient identification, verified procedure, site/side was marked, verified correct patient position, special equipment/implants available, medications/allergies/relevent history reviewed, required imaging and test results available.  Performed  Patient placed on cardiac monitor, pulse oximetry, supplemental oxygen as necessary.  Sedation given: propofol per anesthesia Pacer pads placed anterior and posterior chest.  Cardioverted 4 time(s).  Cardioverted at 150J and 200J x 3 - unsuccessful.  Impression: Findings: Post procedure EKG shows: Atrial Fibrillation Complications: None Patient did tolerate procedure well.  Plan: 1. Unsuccessful DCCV despite multiple shocks and pad repositioning, pressure.  2. Continue tikosyn for rate control - plans per EP.  Time Spent Directly with the Patient:  30 minutes   Jillian Casino, Jillian Eaton, [facility], [place] Director of the Advanced Lipid Disorders &  Cardiovascular Risk Reduction Clinic Diplomate of the American Board of Clinical Lipidology Attending Cardiologist  Direct Dial: [phone]  Fax: [phone]  Website:  [email or web address] Gotay 06/18/2019, 9:07 AM

## 2019-06-18 NOTE — Care Management (Signed)
Per Zorita Pang  W[facility]: [phone]. Co-pay amount for Dofetilide 500mg ,250mg ,125mg , bid for a 30 day supply $60.00 each mg.  No PA required No Deductible Tier 1 Retail pharmacy: [facility], [facility].   Ref.# [id number]

## 2019-06-18 NOTE — H&P (Signed)
   INTERVAL PROCEDURE H&P  History and Physical Interval Note:  06/18/2019 8:23 AM  Jillian Eaton has presented today for their planned procedure. The various methods of treatment have been discussed with the patient and family. After consideration of risks, benefits and other options for treatment, the patient has consented to the procedure.  The patients' outpatient history has been reviewed, patient examined, and no change in status from most recent office note within the past 30 days. I have reviewed the patients' chart and labs and will proceed as planned. Questions were answered to the patient's satisfaction.   Pixie Casino, MD, [facility], [place] Director of the Advanced Lipid Disorders &  Cardiovascular Risk Reduction Clinic Diplomate of the American Board of Clinical Lipidology Attending Cardiologist  Direct Dial: [phone]  Fax: [phone]  Website:  [email or web address] 06/18/2019, 8:23 AM

## 2019-06-19 DIAGNOSIS — I4891 Unspecified atrial fibrillation: Secondary | ICD-10-CM | POA: Diagnosis not present

## 2019-06-19 DIAGNOSIS — I4811 Longstanding persistent atrial fibrillation: Secondary | ICD-10-CM | POA: Diagnosis not present

## 2019-06-19 LAB — BASIC METABOLIC PANEL
Anion gap: 7 (ref 5–15)
BUN: 16 mg/dL (ref 8–23)
CO2: 26 mmol/L (ref 22–32)
Calcium: 8.1 mg/dL — ABNORMAL LOW (ref 8.9–10.3)
Chloride: 107 mmol/L (ref 98–111)
Creatinine, Ser: 1.48 mg/dL — ABNORMAL HIGH (ref 0.44–1.00)
GFR calc Af Amer: 42 mL/min — ABNORMAL LOW (ref >60)
GFR calc non Af Amer: 36 mL/min — ABNORMAL LOW (ref >60)
Glucose, Bld: 78 mg/dL (ref 70–99)
Potassium: 4.1 mmol/L (ref 3.5–5.1)
Sodium: 140 mmol/L (ref 135–145)

## 2019-06-19 LAB — MAGNESIUM: Magnesium: 2 mg/dL (ref 1.7–2.4)

## 2019-06-19 LAB — SURGICAL PCR SCREEN
MRSA, PCR: NEGATIVE
Staphylococcus aureus: NEGATIVE

## 2019-06-19 MED ORDER — CHLORHEXIDINE GLUCONATE 4 % EX LIQD
CUTANEOUS | Status: AC
  Filled 2019-06-19: qty 60

## 2019-06-19 NOTE — Progress Notes (Signed)
Physical Therapy Treatment Patient Details Name: Jillian Eaton [facility] MRN: [id number] DOB: 11-30-1950 Today's Date: 06/19/2019    History of Present Illness 69 y.o. female with medical history significant of SBO; HTN; fibromyalgia; obesity (BMI 32); PAF on Xarelto; and chronic systolic CHF presenting with fatigue and unexplained weight loss. Problem started after knee surgery on 3/9.    PT Comments    Pt in bed on entry, but eager to walk with therapy. Pt reports frustration with tachycardia with minimal activity, especially as she is currently rehabbing her knee. Pt is hopeful that Pacemaker placement tomorrow will help alleviate problem. Pt is currently mod I for bed mobility, transfers and ambulation with Rollator. With ambulation of 150 feet HR increased to 170 bpm but quickly dropped back to 120s with seated rest break. Pt is able to demonstrate proper technique for use of Rollator for seated rest break. D/c plans remain appropriate at this time. PT will continue to follow acutely.    Follow Up Recommendations  Outpatient PT(resumption of Outpatient PT for TKA)     Equipment Recommendations  Other (comment)(4 wheeled walker with seat)       Precautions / Restrictions Precautions Precautions: None Restrictions Weight Bearing Restrictions: No    Mobility  Bed Mobility Overal bed mobility: Needs Assistance Bed Mobility: Supine to Sit     Supine to sit: Modified independent (Device/Increase time)     General bed mobility comments: mod I, use of bedrail to pull to edge  Transfers Overall transfer level: Modified independent Equipment used: None             General transfer comment: able to stand without assist and self steady  Ambulation/Gait Ambulation/Gait assistance: Modified independent (Device/Increase time) Gait Distance (Feet): 300 Feet(2x150) Assistive device: 4-wheeled walker Gait Pattern/deviations: Step-through pattern;Wide base of support Gait  velocity: functional Gait velocity interpretation: >2.62 ft/sec, indicative of community ambulatory General Gait Details: mod I for use of Rollator, ambulated 150 feet able to demonstrate safe Rollator usage for seated rest break           Balance Overall balance assessment: Mild deficits observed, not formally tested                                          Cognition Arousal/Alertness: Awake/alert Behavior During Therapy: WFL for tasks assessed/performed Overall Cognitive Status: Within Functional Limits for tasks assessed                                           General Comments General comments (skin integrity, edema, etc.): HR 170 with ambulation however quickly decreased to 120s with seated rest break      Pertinent Vitals/Pain  No denies pain           PT Goals (current goals can now be found in the care plan section) Acute Rehab PT Goals Patient Stated Goal: to go home PT Goal Formulation: With patient Time For Goal Achievement: 06/29/19 Potential to Achieve Goals: Good Progress towards PT goals: Progressing toward goals    Frequency    Min 3X/week      PT Plan Current plan remains appropriate       AM-PAC PT "6 Clicks" Mobility   Outcome Measure  Help needed turning from your back to your side  while in a flat bed without using bedrails?: None Help needed moving from lying on your back to sitting on the side of a flat bed without using bedrails?: None Help needed moving to and from a bed to a chair (including a wheelchair)?: None Help needed standing up from a chair using your arms (e.g., wheelchair or bedside chair)?: None Help needed to walk in hospital room?: None Help needed climbing 3-5 steps with a railing? : A Lot 6 Click Score: 22    End of Session   Activity Tolerance: Patient tolerated treatment well Patient left: with call bell/phone within reach;in chair Nurse Communication: Mobility status PT  Visit Diagnosis: Other abnormalities of gait and mobility (R26.89)     Time: 1435-1456 PT Time Calculation (min) (ACUTE ONLY): 21 min  Charges:  $Gait Training: 8-22 mins                     [name] B. Beverely Risen PT, DPT Acute Rehabilitation Services Pager [phone] Office [phone]    Elon Alas Fleet 06/19/2019, 5:04 PM

## 2019-06-19 NOTE — Progress Notes (Signed)
Chaplain engaged in follow-up visit with Jillian Eaton.  Mrs. [name] thanked chaplain for helping her have a meaningful conversation with her daughter regarding healthcare POA and choosing a healthcare agent.  Mrs. [name] has been able to discuss with those closest to her what she wants and desires in case she is unable to speak for herself.  Mrs. [name] also expressed being thankful for the physicians that have been giving her care.  She is appreciative of the team she has had and is looking forward to having what she described as a "normal life."  Chaplain affirmed the life Mrs. [name] is looking forward to having outside of the hospital.  Chaplain assesses that Mrs. [name] has a very clear vision for her future and understands what she has to do to obtain that vision.  She has a great amount of joy and gratefulness radiating from her as she prepares for the next stage of her life.   Chaplain will follow-up as needed.

## 2019-06-19 NOTE — Progress Notes (Addendum)
Progress Note  Patient Name: Jillian Eaton [facility] Date of Encounter: 06/19/2019  Primary Cardiologist: Pixie Casino, MD   Subjective   Feels well at rest, last night did get tired, weak after her walk  Inpatient Medications    Scheduled Meds: . apixaban  5 mg Oral BID  . calcitRIOL  0.5 mcg Oral BID  . docusate sodium  100 mg Oral BID  . dofetilide  250 mcg Oral BID  . DULoxetine  60 mg Oral Daily  . feeding supplement (ENSURE ENLIVE)  237 mL Oral TID BM  . fluticasone furoate-vilanterol  1 puff Inhalation Daily  . levETIRAcetam  500 mg Oral Daily  . levothyroxine  150 mcg Oral Q0600  . magnesium oxide  400 mg Oral Daily  . metoprolol succinate  12.5 mg Oral Daily  . multivitamin with minerals  1 tablet Oral Daily  . sodium chloride flush  3 mL Intravenous Q12H  . sodium chloride flush  3 mL Intravenous Q12H  . traZODone  50 mg Oral QHS  . zolpidem  5 mg Oral QHS   Continuous Infusions: . sodium chloride     PRN Meds: sodium chloride, acetaminophen **OR** [DISCONTINUED] acetaminophen, albuterol, bisacodyl, cyclobenzaprine, hydrALAZINE, hydrocortisone cream, morphine injection, ondansetron **OR** ondansetron (ZOFRAN) IV, polyethylene glycol, sodium chloride flush, zolpidem   Vital Signs    Vitals:   06/18/19 1855 06/18/19 1949 06/19/19 0430 06/19/19 0437  BP: 96/83 (!) 89/59 90/64   Pulse: (!) 59 81 64   Resp: 20 19  17   Temp:  97.8 F (36.6 C) 98.3 F (36.8 C)   TempSrc:  Oral Oral   SpO2: 100% 99% 99%   Weight:    90.5 kg  Height:        Intake/Output Summary (Last 24 hours) at 06/19/2019 0816 Last data filed at 06/19/2019 0400 Gross per 24 hour  Intake 460 ml  Output 750 ml  Net -290 ml   Last 3 Weights 06/19/2019 06/18/2019 06/17/2019  Weight (lbs) 199 lb 8 oz 199 lb 3.2 oz 200 lb 4.8 oz  Weight (kg) 90.493 kg 90.357 kg 90.855 kg      Telemetry    AFib 60's-70's  130's-160's with exertion, recovers quickly - Personally Reviewed  ECG      AFib 70bpm,  QTc stable - Personally Reviewed  Physical Exam   No change in exam today GEN: No acute distress.   Neck: No JVD Cardiac: irreg-irreg, no murmurs, rubs, or gallops.  Respiratory: CTA b/l. GI: Soft, nontender, non-distended  MS: No edema; No deformity. Neuro:  Nonfocal  Psych: Normal affect   Labs    High Sensitivity Troponin:   Recent Labs  Lab 06/14/19 1430 06/14/19 2049  TROPONINIHS 3 <2      Chemistry Recent Labs  Lab 06/16/19 0555 06/16/19 0555 06/17/19 0437 06/18/19 0645 06/19/19 0358  NA 140   < > 139 140 140  K 3.9   < > 4.5 4.5 4.1  CL 102   < > 104 106 107  CO2 29   < > 28 27 26   GLUCOSE 68*   < > 67* 64* 78  BUN 9   < > 11 13 16   CREATININE 1.33*   < > 1.28* 1.48* 1.48*  CALCIUM 8.5*   < > 8.3* 8.4* 8.1*  PROT 6.0*  --   --   --   --   ALBUMIN 2.1*  --   --   --   --  AST 13*  --   --   --   --   ALT 9  --   --   --   --   ALKPHOS 70  --   --   --   --   BILITOT 0.6  --   --   --   --   GFRNONAA 41*   < > 43* 36* 36*  GFRAA 48*   < > 50* 42* 42*  ANIONGAP 9   < > 7 7 7    < > = values in this interval not displayed.     Hematology Recent Labs  Lab 06/15/19 0640 06/15/19 0640 06/16/19 0555 06/16/19 1025 06/17/19 0437  WBC 6.5  --  4.3  --  4.7  RBC 3.03*   < > 3.39* 4.08 3.27*  HGB 8.4*  --  9.5*  --  9.1*  HCT 27.4*  --  30.7*  --  29.7*  MCV 90.4  --  90.6  --  90.8  MCH 27.7  --  28.0  --  27.8  MCHC 30.7  --  30.9  --  30.6  RDW 16.0*  --  15.9*  --  15.9*  PLT 276  --  321  --  290   < > = values in this interval not displayed.    BNP Recent Labs  Lab 06/13/19 1429 06/14/19 1430  BNP  --  141.0*  PROBNP 251.0*  --      DDimer No results for input(s): DDIMER in the last 168 hours.   Radiology      Cardiac Studies   06/15/2019: TTE IMPRESSIONS  1. Left ventricular ejection fraction, by estimation, is 60 to 65%. The  left ventricle has normal function. The left ventricle has no regional  wall motion  abnormalities. Left ventricular diastolic parameters are  indeterminate.  2. Right ventricular systolic function is normal. The right ventricular  size is mildly enlarged. There is moderately elevated pulmonary artery  systolic pressure. The estimated right ventricular systolic pressure is  [id number] mmHg.  3. Left atrial size was moderately dilated. (60mm) 4. Right atrial size was moderately dilated.  5. The mitral valve is normal in structure. Mild mitral valve  regurgitation. No evidence of mitral stenosis.  6. Tricuspid valve regurgitation is moderate.  7. The aortic valve is tricuspid. Aortic valve regurgitation is trivial.  Mild aortic valve sclerosis is present, with no evidence of aortic valve  stenosis.  8. The inferior vena cava is normal in size with greater than 50%  respiratory variability, suggesting right atrial pressure of 3 mmHg.   Comparison(s): No significant change from prior study. Prior images  reviewed side by side.    [place] 2012 FINDINGS: 1. Left main - short, no disease. 2. LAD - no significant disease. 3. Left circumflex, no significant disease. 4. RCA - dominant, no disease, large-caliber vessel. 5. LVEDP = 20 mmHg. 6. RA - 12. 7. RV 38/12. 8. PA - 43/19 (31). 9. PCWP - 24. 10.TPG - 7. 11.Fick cardiac output/Fick cardiac index - 10.56/3.84. 12.Thermodilution cardiac output/thermodilution cardiac index - 6.78/2.47. 13.Aortic saturation - 94%. 14.PA saturation - 68%.  IMPRESSION: 1. No significant obstructive coronary artery disease. 2. LVEDP = 20 mmHg. 3.Borderline pulmonary venous hypertension. 4. High cardiac output.  Patient Profile     69 y.o. 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  Cardiology consult noted Chest CT performed in October 2020 shows an enlarged pulmonary artery consistent with pulmonary hypertension. Pulmonary function tests show relatively matched restrictive/obstructive parameters with a total lung capacity of roughly 70% of predicted and commensurate reduction in DLCO  CT chest this admission IMPRESSION: 1. 9 mm obstructing renal stone in the distal right ureter, with additional 7 mm obstructing renal stone seen at the right UVJ. 2. Evidence of prior gastric bypass surgery. 3. Small hiatal hernia. 4. No acute or active cardiopulmonary disease   She was found to have rate control at rest though with exertion rates rise sharply and associated with SOB.   BP limitingtitration of her BB, dig was added on admission Her anemia thought to play a significant role, as well as her obesity (though noting excellent efforts by the pt in the last couple years with >200lbs lost)   AFib hx Diagnosed April 2019 (while on vacation in [place]), hospitalized there w/RVR In 2019 her weight 413 (pt confirmed) >> 200lbs this admission   AAD Hx Amiodarone June 2019, started during a hospital stay July 2019 failed DCCV (shocked 3x)  >> recommended rate control strategy July 2019, amio stopped BP has limited somewhat her diuretics/rate limiting meds  Assessment & Plan    1. AFib (historically felt to be permanent)     CHA2DS2Vasc is 4, on Eliquis, appropriately dosed, patient reports compliance without missed doses for > 3weeks prior to admission      Tikosyn load is in progress K+ 4.1 Mag 2.0 Creat 1.48 (calcl is 53) QT stable  Failed DCCV yesterday  Dr. Lovena Le has seen and examined the patient today Lengthy discussions  regarding options going forward She has previously failed amiodarone Pace/AVNode ablation was discussed Discussed referral to one of our AFib ablation EP MD's, though discussed felt likely low success of this option.   Rates at rest remain very good, 60's-90's, with ambulation again has gotten 130-150's Dr. Lovena Le suspects a component of recent deconditioning BP limits rate control meds significantly  Continue Tikosyn   Dr. Rayann Heman will review case/chart today for his thoughts/recommendations  ADDEND: Dr. Rayann Heman has had n opportunity to discuss AFiib ablation with the patient.  She has decided to pursue PPM implant and av node ablation with Der. Lovena Le.  I will add to tomorrow's scheduled with Dr. Lovena Le. He was made aware. Will stop Tikosyn Hold Eliquis   For questions or updates, please contact [place] Please consult [email or web address] for contact info under     Signed, Baldwin Jamaica, PA-C  06/19/2019, 8:16 AM    I have seen, examined the patient, and reviewed the above assessment and plan.  Changes to above are made where necessary.  On exam, iRRR.   The patient has symptomatic, recurrent longstanding persistent atrial fibrillation. she has failed medical therapy with tikosyn and amiodarone.  V rates are elevated Chads2vasc score is 4.  she is anticoagulated with eliquis . Therapeutic strategies for afib including rate control, AV nodal ablation/ PPM implant, and ablation were discussed in detail with the patient today. Risk, benefits, and alternatives to each approach was discussed at length. EP study and radiofrequency ablation for  afib were also discussed in detail today.   At this time, she is clear that she would not want to proceed with afib ablation given anticipated low success rates for her with longstanding persistent afib.  She would prefer AV nodal ablation with PPM implant.  We did discuss this option at length.  She understands that she will be device depending  with this approach and wishes to proceed. We will therefore place PPM with AV nodal ablation by Dr Lovena Le tomorrow.  Co Sign: Thompson Grayer, MD 06/19/2019 3:01 PM

## 2019-06-19 NOTE — Progress Notes (Addendum)
Nutrition Follow-up  DOCUMENTATION CODES:   Not applicable  INTERVENTION:   -D/c Ensure Enlive po TID, each supplement provides 350 kcal and 20 grams of protein -Continue MVI with minerals daily -Magic cup TID with meals, each supplement provides 290 kcal and 9 grams of protein  NUTRITION DIAGNOSIS:   Unintentional weight loss related to (suspected inadequate oral intake) as evidenced by percent weight loss.  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  Reviewed I.O's: -290 ml x 24 hours and +520 ml since admission  UOP: 750 ml x 24 hours  Plan for PPM placement tomorrow, 06/20/19.   Case dicussed with RN, who reports pt with great appetite.   Spoke with pt at bedside, who reports feeling better today. Per pt, her appetite has improved over the past several days and she is now able to eat about 75% off of her meal trays. Pt explains that for about a month PTA, she would try to eat but often was unable to consume only 1-2 bites at time. She shares :I would have the taste for food but I'd put it in my mouth and it wouldn't go down- it felt like my throat and brain were no connected".   Pt reports that she has had a history of bariatric surgery in the distant past x 2 (1980's and 1990's). Pt shares that she often struggled with regaining weight back and lost about  200# (from 410-220#) over the past few years to qualify for knee replacement surgery. Per pt, she lost this weight gradually by reducing portion sizes ("I finally was disciplined enough to portion out the foods I liked and it took a lot of faith and prayer"). However, pt reports concern that she has lost about 10-20# unintentionally over the past month due to reduced oral intake. Reviewed wt hx; pt has experienced a 8.2% wt loss over the past month, which is significant for time frame.   Labs reviewed.   NUTRITION - FOCUSED PHYSICAL EXAM:    Most Recent Value  Orbital Region  No depletion  Upper Arm Region  Mild depletion  Thoracic and Lumbar Region  No depletion  Buccal Region  No depletion  Temple Region  No depletion  Clavicle Bone Region  No depletion  Clavicle and Acromion Bone Region  No depletion  Scapular Bone Region  No depletion  Dorsal Hand  Mild depletion  Patellar Region  No depletion  Anterior Thigh Region  No depletion  Posterior Calf Region  No depletion  Edema (RD Assessment)  Mild  Hair  Reviewed  Eyes  Reviewed  Mouth  Reviewed  Skin  Reviewed  Nails  Reviewed       Diet Order:   Diet Order            Diet NPO time specified Except for: Sips with Meds  Diet effective  midnight        Diet Heart Room service appropriate? Yes; Fluid consistency: Thin  Diet effective now              EDUCATION NEEDS:   No education needs have been identified at this time  Skin:  Skin Assessment: Reviewed RN Assessment  Last BM:  06/17/19  Height:   Ht Readings from Last 1 Encounters:  06/15/19 5\' 7"  (1.702 m)    Weight:   Wt Readings from Last 1 Encounters:  06/19/19 90.5 kg    BMI:  Body mass index is 31.25 kg/m.  Estimated Nutritional Needs:   Kcal:  1950-2150  Protein:  95-110 grams  Fluid:  > 1.9 L    Loistine Chance, RD, LDN, [place] Registered Dietitian II Certified Diabetes Care and Education Specialist Please refer to [facility] for RD and/or RD  on-call/weekend/after hours pager

## 2019-06-19 NOTE — TOC Progression Note (Signed)
Transition of Care [facility]) - Progression Note    Patient Details  Name: Jillian Eaton MRN: [id number] Date of Birth: 10/01/1950  [facility] CM/SW Contact  Zenon Mayo, RN Phone Number: 06/19/2019, 10:05 AM  Clinical Narrative:    NCM spoke with patient again today to check on outpt physical therapy, she states she goes to [facility] for her physical therapy, so no she will not need it and also NCM asked if she wanted a rollator, she states no she does not think she wants this either because she lives in an apartment and this will just be another piece of furniture in the way.    Expected Discharge Plan: Home/Self Care Barriers to Discharge: Continued Medical Work up  Expected Discharge Plan and Services Expected Discharge Plan: Home/Self Care   Discharge Planning Services: CM Consult   Living arrangements for the past 2 months: Single Family Home                                       Social Determinants of Health (SDOH) Interventions    Readmission Risk Interventions Readmission Risk Prevention Plan 06/17/2019 12/09/2018 05/30/2018  Transportation Screening Complete Complete -  PCP or Specialist Appt within 3-5 Days - Complete Complete  HRI or Home Care Consult - Complete -  Social Work Consult for [place] Planning/Counseling - Complete -  SW consult not completed comments - - -  Palliative Care Screening - Not Applicable -  Medication Review (RN Care Manager) Complete Referral to Pharmacy Referral to Pharmacy  [place] or Home Care Consult Complete - -  SW Recovery Care/Counseling Consult Complete - -  Palliative Care Screening Not Applicable - -  [place] Not Applicable - -  Some recent data might be hidden

## 2019-06-19 NOTE — Progress Notes (Signed)
PROGRESS NOTE  Jillian Eaton OQH:[id number] DOB: June 04, 1950 DOA: 06/14/2019 PCP: [name], MD  Brief Narrative:  69 year old with a history of SBO, HTN, fibromyalgia, obesity, and atrial fibrillation on Xarelto who presented with severe fatigue and unexplained weight loss.  She explains severe shortness of breath and fatigue dating back to March 9, following a knee surgery.  Previous work-ups via her PCP a hematologist and ER visits have been unrevealing.  She reported having intermittent tachycardia for the last month, sometimes heart rate go up to 140s, she has not been able to take Lopressor as prescribed due to borderline blood pressure at home  She started to have chest tightness feeling dizzy for the last week   Significant Events: 4/26 admit via [facility]  Antimicrobials:  None    HPI/Recap of past 24 hours:  She  remains  in A. fib, rate controlled at rest, but tachycardia with light activity She is frustrated due to poor tolerance to light activity,  blood pressure low normal, no hypoxia at rest Denies chest pain currently    Assessment/Plan: Principal Problem:   Fatigue Active Problems:   Obesity (BMI 30-39.9)   Essential hypertension   Seizures (HCC)   Chronic atrial fibrillation (HCC)   Chronic pain syndrome   Depressive disorder   Chronic diastolic CHF (congestive heart failure) (HCC)   Hypothyroidism   Atrial fibrillation with RVR (HCC)  Fatigue (presenting symptom) -T46 and folic acid are normal  -a.m. serum cortisol is normal  -Hemoglobin 9.1 ( baseline 8-9 last year, it appeared better in early 2020), she reports getting iron infusion a few times -Fatigue likely due to A. 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  Seizure disorder Continue Keppra  Depression Continue home treatment regimen  FTT: reports she is the primary care giver to her husband who has progressive dementia, she recently rehomed her dog of 8years due to declined health, her husband is currently under the care of his family  DVT Prophylaxis: Eliquis  Code Status: Full  Family Communication: patient   Disposition Plan:    Patient came from:            Home                                                                                               Anticipated d/c place:  Pending PT eval, clinical improvement  Barriers to d/c OR conditions which need to be met to effect a safe d/c:  Need cardiology clearance   Consultants:  Cardiology/EP  Procedures:  Unsuccessful cardioversion on April 28  Antibiotics:  None   Objective: BP 94/72 (BP Location: Left Arm)   Pulse 84   Temp 98.3 F (36.8 C) (Oral)   Resp 19   Ht _0  (1.702 m)   Wt 90.5 kg   SpO2 96%   BMI 31.25 kg/m   Intake/Output Summary (Last 24 hours) at 06/19/2019 1402 Last data filed at 06/19/2019 0854 Gross per 24 hour  Intake 480 ml  Output 550 ml  Net -70 ml   Filed Weights   06/17/19 0104 06/18/19 0631 06/19/19 0437  Weight: 90.9 kg 90.4 kg 90.5 kg     Exam: Patient is examined daily including today on 06/19/2019, exams remain the same as of yesterday except that has changed    General:  Appear weak, but NAD  Cardiovascular: IRRR  Respiratory: CTABL  Abdomen: Soft/ND/NT, positive BS  Musculoskeletal: No Edema  Neuro: alert, oriented x3  Data Reviewed: Basic Metabolic Panel: Recent Labs  Lab 06/15/19 0640 06/16/19 0555 06/17/19 0437 06/18/19 0645 06/19/19 0358  NA 137 140 139 140 140  K 4.0 3.9 4.5 4.5 4.1  CL 103 102 104 106 107  CO2 _1 GLUCOSE 66* 68* 67* 64* 78  BUN _2 CREATININE 1.26* 1.33* 1.28* 1.48* 1.48*  CALCIUM 8.1* 8.5* 8.3* 8.4* 8.1*  MG  --  1.8 2.2 2.1 2.0  PHOS  --  3.2  --   --   --    Liver Function Tests: Recent Labs  Lab 06/16/19 0555  AST 13*  ALT 9  ALKPHOS 70  BILITOT 0.6  PROT 6.0*  ALBUMIN 2.1*   No results for input(s): LIPASE, AMYLASE in the last 168 hours. No results for input(s): AMMONIA in the last 168 hours. CBC: Recent Labs  Lab 06/13/19 1639 06/14/19 1430 06/15/19 0640 06/16/19 0555 06/17/19 0437  WBC 8.9 7.0 6.5 4.3 4.7  NEUTROABS  --  5.4  --   --   --   HGB 11.2 9.7* 8.4* 9.5* 9.1*  HCT 36.9 32.0* 27.4* 30.7* 29.7*  MCV 92 91.2 90.4 90.6 90.8  PLT 360 320 276 321 290   Cardiac Enzymes:   No results for input(s): CKTOTAL, CKMB, CKMBINDEX, TROPONINI in the last 168 hours. BNP (last 3 results) Recent Labs    11/27/18 1235 12/06/18 1934 06/14/19 1430  BNP 239.3* 182.3* 141.0*    ProBNP (last 3 results) Recent Labs    06/13/19 1429  PROBNP 251.0*    CBG: No results for input(s): GLUCAP in the last 168 hours.  Recent Results (from the past 240 hour(s))  Respiratory Panel by RT PCR (Flu A&B, Covid) - Nasopharyngeal Swab     Status: None   Collection Time: 06/14/19  8:49 PM   Specimen: Nasopharyngeal Swab  Result Value Ref Range Status   SARS Coronavirus 2 by RT PCR NEGATIVE NEGATIVE Final    Comment: (NOTE) SARS-CoV-2  target nucleic acids are NOT DETECTED. The SARS-CoV-2 RNA is generally detectable in upper respiratoy specimens during the acute phase of infection. The lowest concentration of SARS-CoV-2 viral copies this assay can detect is 131 copies/mL. A negative result does not preclude SARS-Cov-2 infection and should not be used as the sole basis for treatment or other  patient management decisions. A negative result may occur with  improper specimen collection/handling, submission of specimen other than nasopharyngeal swab, presence of viral mutation(s) within the areas targeted by this assay, and inadequate number of viral copies (<131 copies/mL). A negative result must be combined with clinical observations, patient history, and epidemiological information. The expected result is Negative. Fact Sheet for Patients:  PinkCheek.be Fact Sheet for Healthcare Providers:  GravelBags.it This test is not yet ap proved or cleared by the [place] FDA and  has been authorized for detection and/or diagnosis of SARS-CoV-2 by FDA under an Emergency Use Authorization (EUA). This EUA will remain  in effect (meaning this test can be used) for the duration of the COVID-19 declaration under Section 564(b)(1) of the Act, 21 U.S.C. section 360bbb-3(b)(1), unless the authorization is terminated or revoked sooner.    Influenza A by PCR NEGATIVE NEGATIVE Final   Influenza B by PCR NEGATIVE NEGATIVE Final    Comment: (NOTE) The Xpert Xpress SARS-CoV-2/FLU/RSV assay is intended as an aid in  the diagnosis of influenza from Nasopharyngeal swab specimens and  should not be used as a sole basis for treatment. Nasal washings and  aspirates are unacceptable for Xpert Xpress SARS-CoV-2/FLU/RSV  testing. Fact Sheet for Patients: PinkCheek.be Fact Sheet for Healthcare Providers: GravelBags.it This test is  not yet approved or cleared by the [place] FDA and  has been authorized for detection and/or diagnosis of SARS-CoV-2 by  FDA under an Emergency Use Authorization (EUA). This EUA will remain  in effect (meaning this test can be used) for the duration of the  Covid-19 declaration under Section 564(b)(1) of the Act, 21  U.S.C. section 360bbb-3(b)(1), unless the authorization is  terminated or revoked. Performed at [facility], [place] [address] [place], [place] [address]      Studies: No results found.  Scheduled Meds: . apixaban  5 mg Oral BID  . calcitRIOL  0.5 mcg Oral BID  . docusate sodium  100 mg Oral BID  . dofetilide  250 mcg Oral BID  . DULoxetine  60 mg Oral Daily  . feeding supplement (ENSURE ENLIVE)  237 mL Oral TID BM  . fluticasone furoate-vilanterol  1 puff Inhalation Daily  . levETIRAcetam  500 mg Oral Daily  . levothyroxine  150 mcg Oral Q0600  . magnesium oxide  400 mg Oral Daily  . metoprolol succinate  12.5 mg Oral Daily  . multivitamin with minerals  1 tablet Oral Daily  . sodium chloride flush  3 mL Intravenous Q12H  . sodium chloride flush  3 mL Intravenous Q12H  . traZODone  50 mg Oral QHS  . zolpidem  5 mg Oral QHS    Continuous Infusions: . sodium chloride       Time spent: 16mns I have personally reviewed and interpreted on  06/19/2019 daily labs, tele strips, imagings as discussed above under date review session and assessment and plans.  I reviewed all nursing notes, pharmacy notes, consultant notes,  vitals, pertinent old records  I have discussed plan of care as described above with RN , patient  on 06/19/2019   FFlorencia ReasonsMD, PhD, FACP  [facility]  Available via Epic secure chat 7am-7pm for nonurgent issues Please page for urgent issues, pager number available through [facility] [place] .   06/19/2019, 2:02 PM  LOS: 4 days

## 2019-06-19 NOTE — Care Management Important Message (Signed)
Important Message  Patient Details  Name: Jillian Eaton MRN: [id number] Date of Birth: 12-02-1950   Medicare Important Message Given:  Yes     Shelda Altes 06/19/2019, 10:38 AM

## 2019-06-20 ENCOUNTER — Inpatient Hospital Stay (HOSPITAL_COMMUNITY)
Admission: EM | Disposition: A | Discharge status: Home / Self Care | Payer: Self-pay | Source: Home / Self Care | Attending: Internal Medicine

## 2019-06-20 ENCOUNTER — Ambulatory Visit: Payer: Medicare Other | Admitting: Physician Assistant

## 2019-06-20 HISTORY — PX: AV NODE ABLATION: EP1193

## 2019-06-20 HISTORY — PX: PACEMAKER IMPLANT: EP1218

## 2019-06-20 SURGERY — PACEMAKER IMPLANT

## 2019-06-20 MED ORDER — SODIUM CHLORIDE 0.9 % IV SOLN
INTRAVENOUS | Status: AC
  Filled 2019-06-20: qty 2

## 2019-06-20 MED ORDER — FENTANYL CITRATE (PF) 100 MCG/2ML IJ SOLN
INTRAMUSCULAR | Status: AC
  Filled 2019-06-20: qty 2

## 2019-06-20 MED ORDER — FENTANYL CITRATE (PF) 100 MCG/2ML IJ SOLN
INTRAMUSCULAR | Status: DC | PRN
  Administered 2019-06-20: 25 ug via INTRAVENOUS
  Administered 2019-06-20: 12.5 ug via INTRAVENOUS
  Administered 2019-06-20 (×2): 25 ug via INTRAVENOUS
  Administered 2019-06-20: 12.5 ug via INTRAVENOUS
  Administered 2019-06-20: 25 ug via INTRAVENOUS

## 2019-06-20 MED ORDER — BUPIVACAINE HCL (PF) 0.25 % IJ SOLN
INTRAMUSCULAR | Status: AC
  Filled 2019-06-20: qty 30

## 2019-06-20 MED ORDER — CEFAZOLIN SODIUM-DEXTROSE 2-4 GM/100ML-% IV SOLN
INTRAVENOUS | Status: AC
  Filled 2019-06-20: qty 100

## 2019-06-20 MED ORDER — ACETAMINOPHEN 325 MG PO TABS: 325.0000 mg | ORAL_TABLET | ORAL | Status: DC | PRN

## 2019-06-20 MED ORDER — MIDAZOLAM HCL 5 MG/5ML IJ SOLN
INTRAMUSCULAR | Status: AC
  Filled 2019-06-20: qty 5

## 2019-06-20 MED ORDER — LIDOCAINE HCL 1 % IJ SOLN
INTRAMUSCULAR | Status: AC
  Filled 2019-06-20: qty 60

## 2019-06-20 MED ORDER — HEPARIN (PORCINE) IN NACL 1000-0.9 UT/500ML-% IV SOLN
INTRAVENOUS | Status: AC
  Filled 2019-06-20: qty 500

## 2019-06-20 MED ORDER — SODIUM CHLORIDE 0.9 % IV SOLN: INTRAVENOUS | Status: DC

## 2019-06-20 MED ORDER — SODIUM CHLORIDE 0.9 % IV SOLN
80.0000 mg | INTRAVENOUS | Status: AC
  Administered 2019-06-20: 80 mg

## 2019-06-20 MED ORDER — CHLORHEXIDINE GLUCONATE 4 % EX LIQD: 60.0000 mL | Freq: Once | CUTANEOUS | Status: DC

## 2019-06-20 MED ORDER — ONDANSETRON HCL 4 MG/2ML IJ SOLN: 4.0000 mg | Freq: Four times a day (QID) | INTRAMUSCULAR | Status: DC | PRN

## 2019-06-20 MED ORDER — BUPIVACAINE HCL (PF) 0.25 % IJ SOLN
INTRAMUSCULAR | Status: DC | PRN
  Administered 2019-06-20: 20 mL

## 2019-06-20 MED ORDER — CHLORHEXIDINE GLUCONATE 4 % EX LIQD
60.0000 mL | Freq: Once | CUTANEOUS | Status: AC
  Administered 2019-06-20: 4 via TOPICAL
  Filled 2019-06-20: qty 60

## 2019-06-20 MED ORDER — CEFAZOLIN SODIUM-DEXTROSE 2-4 GM/100ML-% IV SOLN
2.0000 g | INTRAVENOUS | Status: AC
  Administered 2019-06-20: 2 g via INTRAVENOUS

## 2019-06-20 MED ORDER — SODIUM CHLORIDE 0.9% FLUSH
3.0000 mL | INTRAVENOUS | Status: DC | PRN
  Administered 2019-06-20: 3 mL via INTRAVENOUS

## 2019-06-20 MED ORDER — CEFAZOLIN SODIUM-DEXTROSE 1-4 GM/50ML-% IV SOLN
1.0000 g | Freq: Four times a day (QID) | INTRAVENOUS | Status: AC
  Administered 2019-06-20 – 2019-06-21 (×3): 1 g via INTRAVENOUS
  Filled 2019-06-20 (×3): qty 50

## 2019-06-20 MED ORDER — MIDAZOLAM HCL 5 MG/5ML IJ SOLN
INTRAMUSCULAR | Status: DC | PRN
  Administered 2019-06-20: 2 mg via INTRAVENOUS
  Administered 2019-06-20 (×5): 1 mg via INTRAVENOUS
  Administered 2019-06-20: 2 mg via INTRAVENOUS

## 2019-06-20 MED ORDER — SODIUM CHLORIDE 0.9 % IV SOLN: 250.0000 mL | INTRAVENOUS | Status: DC

## 2019-06-20 MED ORDER — LIDOCAINE HCL (PF) 1 % IJ SOLN
INTRAMUSCULAR | Status: DC | PRN
  Administered 2019-06-20: 40 mL

## 2019-06-20 MED ORDER — HEPARIN (PORCINE) IN NACL 1000-0.9 UT/500ML-% IV SOLN
INTRAVENOUS | Status: DC | PRN
  Administered 2019-06-20: 500 mL

## 2019-06-20 MED ORDER — SODIUM CHLORIDE 0.9% FLUSH
3.0000 mL | Freq: Two times a day (BID) | INTRAVENOUS | Status: DC
  Administered 2019-06-20 – 2019-06-21 (×2): 3 mL via INTRAVENOUS

## 2019-06-20 SURGICAL SUPPLY — 15 items
CABLE SURGICAL S-101-97-12 (CABLE) ×2 IMPLANT
CATH CELSIUS THERMO F CV 7FR (ABLATOR) ×2 IMPLANT
CATH RIGHTSITE C315HIS02 (CATHETERS) ×2 IMPLANT
IPG PACE AZUR XT DR MRI W1DR01 (Pacemaker) ×1 IMPLANT
LEAD SELECT SECURE 3830 383069 (Lead) ×1 IMPLANT
PACE AZURE XT DR MRI W1DR01 (Pacemaker) ×2 IMPLANT
PACK EP LATEX FREE (CUSTOM PROCEDURE TRAY) ×1
PACK EP LF (CUSTOM PROCEDURE TRAY) ×1 IMPLANT
PAD PRO RADIOLUCENT 2001M-C (PAD) ×2 IMPLANT
SELECT SECURE 3830 383069 (Lead) ×2 IMPLANT
SHEATH 7FR PRELUDE SNAP 13 (SHEATH) ×4 IMPLANT
SHEATH PINNACLE 8F 10CM (SHEATH) ×2 IMPLANT
SLITTER 6232ADJ (MISCELLANEOUS) ×2 IMPLANT
TRAY PACEMAKER INSERTION (PACKS) ×2 IMPLANT
WIRE HI TORQ VERSACORE-J 145CM (WIRE) ×2 IMPLANT

## 2019-06-20 NOTE — TOC Progression Note (Signed)
Transition of Care [facility]) - Progression Note    Patient Details  Name: Jillian Eaton MRN: [id number] Date of Birth: [birth year]  [facility] CM/SW Contact  Zenon Mayo, RN Phone Number: 06/20/2019, 4:07 PM  Clinical Narrative:    Per MD and EP, patient will not be on tykison.    Expected Discharge Plan: Home/Self Care Barriers to Discharge: Continued Medical Work up  Expected Discharge Plan and Services Expected Discharge Plan: Home/Self Care   Discharge Planning Services: CM Consult   Living arrangements for the past 2 months: Single Family Home                                       Social Determinants of Health (SDOH) Interventions    Readmission Risk Interventions Readmission Risk Prevention Plan 06/17/2019 12/09/2018 05/30/2018  Transportation Screening Complete Complete -  PCP or Specialist Appt within 3-5 Days - Complete Complete  HRI or Home Care Consult - Complete -  Social Work Consult for [place] Planning/Counseling - Complete -  SW consult not completed comments - - -  Palliative Care Screening - Not Applicable -  Medication Review (RN Care Manager) Complete Referral to Pharmacy Referral to Pharmacy  [place] or Home Care Consult Complete - -  SW Recovery Care/Counseling Consult Complete - -  Palliative Care Screening Not Applicable - -  [place] Not Applicable - -  Some recent data might be hidden

## 2019-06-20 NOTE — Progress Notes (Signed)
Mews score was yellow for one set of vs but quickly improved O2 sat as patient woke up and was placed on n/c o2 to improve o2 sats till she fully was awake after surgical procedure now. Her Mews score have been green since,she is alert enough to set at bedside to eat her meal tray and talk on the phone to family. She did receive sedation for procedure so we did not enact mews protocol as her vs greatly improved next set and the vs following.

## 2019-06-20 NOTE — Progress Notes (Addendum)
Progress Note  Patient Name: Jillian Eaton [facility] Date of Encounter: 06/20/2019  Primary Cardiologist: Pixie Casino, MD   Subjective   Feels well at rest, happy to have a plan in place  Inpatient Medications    Scheduled Meds: . calcitRIOL  0.5 mcg Oral BID  . docusate sodium  100 mg Oral BID  . DULoxetine  60 mg Oral Daily  . fluticasone furoate-vilanterol  1 puff Inhalation Daily  . gentamicin irrigation  80 mg Irrigation On Call  . levETIRAcetam  500 mg Oral Daily  . levothyroxine  150 mcg Oral Q0600  . magnesium oxide  400 mg Oral Daily  . metoprolol succinate  12.5 mg Oral Daily  . multivitamin with minerals  1 tablet Oral Daily  . sodium chloride flush  3 mL Intravenous Q12H  . sodium chloride flush  3 mL Intravenous Q12H  . sodium chloride flush  3 mL Intravenous Q12H  . traZODone  50 mg Oral QHS  . zolpidem  5 mg Oral QHS   Continuous Infusions: . sodium chloride    . sodium chloride    . sodium chloride    .  ceFAZolin (ANCEF) IV     PRN Meds: sodium chloride, acetaminophen **OR** [DISCONTINUED] acetaminophen, albuterol, bisacodyl, cyclobenzaprine, hydrALAZINE, hydrocortisone cream, morphine injection, ondansetron **OR** ondansetron (ZOFRAN) IV, polyethylene glycol, sodium chloride flush, sodium chloride flush, zolpidem   Vital Signs    Vitals:   06/19/19 1619 06/19/19 2029 06/20/19 0621 06/20/19 0627  BP: 97/64 95/74 96/71    Pulse: 83 95 82   Resp: 18 17 17    Temp: 97.8 F (36.6 C) 98.2 F (36.8 C) 97.8 F (36.6 C)   TempSrc: Oral Oral Oral   SpO2: 97% 97% 100%   Weight:    90.3 kg  Height:        Intake/Output Summary (Last 24 hours) at 06/20/2019 0735 Last data filed at 06/19/2019 2033 Gross per 24 hour  Intake 700 ml  Output 900 ml  Net -200 ml   Last 3 Weights 06/20/2019 06/19/2019 06/18/2019  Weight (lbs) 199 lb 199 lb 8 oz 199 lb 3.2 oz  Weight (kg) 90.266 kg 90.493 kg 90.357 kg      Telemetry    AFib 60's-70's  120's with  exertion, recovers quickly - Personally Reviewed  ECG    AFib 80bpm,  QTc stable - Personally Reviewed  Physical Exam   No change in exam today GEN: No acute distress.   Neck: No JVD Cardiac: irreg-irreg, no murmurs, rubs, or gallops.  Respiratory: CTA b/l. GI: Soft, nontender, non-distended  MS: No edema; No deformity. Neuro:  Nonfocal  Psych: Normal affect   Labs    High Sensitivity Troponin:   Recent Labs  Lab 06/14/19 1430 06/14/19 2049  TROPONINIHS 3 <2      Chemistry Recent Labs  Lab 06/16/19 0555 06/16/19 0555 06/17/19 0437 06/18/19 0645 06/19/19 0358  NA 140   < > 139 140 140  K 3.9   < > 4.5 4.5 4.1  CL 102   < > 104 106 107  CO2 29   < > 28 27 26   GLUCOSE 68*   < > 67* 64* 78  BUN 9   < > 11 13 16   CREATININE 1.33*   < > 1.28* 1.48* 1.48*  CALCIUM 8.5*   < > 8.3* 8.4* 8.1*  PROT 6.0*  --   --   --   --   ALBUMIN 2.1*  --   --   --   --  AST 13*  --   --   --   --   ALT 9  --   --   --   --   ALKPHOS 70  --   --   --   --   BILITOT 0.6  --   --   --   --   GFRNONAA 41*   < > 43* 36* 36*  GFRAA 48*   < > 50* 42* 42*  ANIONGAP 9   < > 7 7 7    < > = values in this interval not displayed.     Hematology Recent Labs  Lab 06/15/19 0640 06/15/19 0640 06/16/19 0555 06/16/19 1025 06/17/19 0437  WBC 6.5  --  4.3  --  4.7  RBC 3.03*   < > 3.39* 4.08 3.27*  HGB 8.4*  --  9.5*  --  9.1*  HCT 27.4*  --  30.7*  --  29.7*  MCV 90.4  --  90.6  --  90.8  MCH 27.7  --  28.0  --  27.8  MCHC 30.7  --  30.9  --  30.6  RDW 16.0*  --  15.9*  --  15.9*  PLT 276  --  321  --  290   < > = values in this interval not displayed.    BNP Recent Labs  Lab 06/13/19 1429 06/14/19 1430  BNP  --  141.0*  PROBNP 251.0*  --      DDimer No results for input(s): DDIMER in the last 168 hours.   Radiology      Cardiac Studies   06/15/2019: TTE IMPRESSIONS  1. Left ventricular ejection fraction, by estimation, is 60 to 65%. The  left ventricle has normal  function. The left ventricle has no regional  wall motion abnormalities. Left ventricular diastolic parameters are  indeterminate.  2. Right ventricular systolic function is normal. The right ventricular  size is mildly enlarged. There is moderately elevated pulmonary artery  systolic pressure. The estimated right ventricular systolic pressure is  [id number] mmHg.  3. Left atrial size was moderately dilated. (48mm) 4. Right atrial size was moderately dilated.  5. The mitral valve is normal in structure. Mild mitral valve  regurgitation. No evidence of mitral stenosis.  6. Tricuspid valve regurgitation is moderate.  7. The aortic valve is tricuspid. Aortic valve regurgitation is trivial.  Mild aortic valve sclerosis is present, with no evidence of aortic valve  stenosis.  8. The inferior vena cava is normal in size with greater than 50%  respiratory variability, suggesting right atrial pressure of 3 mmHg.   Comparison(s): No significant change from prior study. Prior images  reviewed side by side.    [place] 2012 FINDINGS: 1. Left main - short, no disease. 2. LAD - no significant disease. 3. Left circumflex, no significant disease. 4. RCA - dominant, no disease, large-caliber vessel. 5. LVEDP = 20 mmHg. 6. RA - 12. 7. RV 38/12. 8. PA - 43/19 (31). 9. PCWP - 24. 10.TPG - 7. 11.Fick cardiac output/Fick cardiac index - 10.56/3.84. 12.Thermodilution cardiac output/thermodilution cardiac index - 6.78/2.47. 13.Aortic saturation - 94%. 14.PA saturation - 68%.  IMPRESSION: 1. No significant obstructive coronary artery disease. 2. LVEDP = 20 mmHg. 3.Borderline pulmonary venous hypertension. 4. High cardiac output.  Patient Profile     69 y.o. female with a hx of fibromyalgia, normal coronaries by cath 2012, kidney stones, p.HTN on home O2, 2018 she had an unintentional narcotic OD resulting  In  aspiration pneumonia, echo then noted LVEF 25%, though has had subsequent  improvement in LVEF, chronic CHF (diastolic), HTN, hypothyroidism (s/p thyroidectomy 2/2 goiter), morbid obesity (s/p ROUX-EN-Y GASTRIC BYPASS in [place]), Iron def anemia, CKD (III), and Afib (felt to be permanent)   admitted to [facility]14/2019 for exertional palpitations and worsening DOE.  Cardiology consult noted Chest CT performed in October 2020 shows an enlarged pulmonary artery consistent with pulmonary hypertension. Pulmonary function tests show relatively matched restrictive/obstructive parameters with a total lung capacity of roughly 70% of predicted and commensurate reduction in DLCO  CT chest this admission IMPRESSION: 1. 9 mm obstructing renal stone in the distal right ureter, with additional 7 mm obstructing renal stone seen at the right UVJ. 2. Evidence of prior gastric bypass surgery. 3. Small hiatal hernia. 4. No acute or active cardiopulmonary disease   She was found to have rate control at rest though with exertion rates rise sharply and associated with SOB.   BP limitingtitration of her BB, dig was added on admission Her anemia thought to play a significant role, as well as her obesity (though noting excellent efforts by the pt in the last couple years with >200lbs lost)   AFib hx Diagnosed April 2019 (while on vacation in [place]), hospitalized there w/RVR In 2019 her weight 413 (pt confirmed) >> 200lbs this admission   AAD Hx Amiodarone June 2019, started during a hospital stay July 2019 failed DCCV (shocked 3x)  >> recommended rate control strategy July 2019, amio stopped BP has limited somewhat her diuretics/rate limiting meds  Assessment & Plan    1. AFib (historically felt to be permanent)     CHA2DS2Vasc is 4, on Eliquis, appropriately dosed  Unfortunately despite historically amiodarone and now Tikosyn we have been unsuccessful in obtaining SR. Her rates at rest continue to be very well controlled, though minimal exertion again gets her HRs  to 120's and as high 160's      BP barely tolerates her current metoprolol, will stop it, avoid hypotension in procedure and won't need going forward  After much discussion on rate, rhythm control strategies were discussed yesterday, planned for PPM and AV node ablation today Revisited the procedures today, answered her follow up questions, she remains agreeable to proceed.   C/w attending 2. Fibromyalgia 3. P.HTN 4. Obesity (has lost 200lbs in 2+ years) 5. Chronic iron def anemia      (looks at her baseline for the last 11 mo), ? If her Keppra, deferred to her heme-onc, neurology team outpatient  For questions or updates, please contact [place] Please consult [email or web address] for contact info under     Signed, Baldwin Jamaica, PA-C  06/20/2019, 7:35 AM    EP Attending  Patient seen and examined. Discussed with Dr. Greggory Brandy who saw the patient at my request yesterday. She has been in atrial fib for over 2 years and failed DCCV after amio and dofetilide. She has refused attempted catheter ablation of atrial fib. We will proceed with AV node ablation and insertion of a left bundle area pacing lead followed by AV node ablation as she has uncontrolled atrial fib with any exertion. I have reviewed the risk/benefits/goals/expectations and she wishes to proceed.  Mikle Bosworth.D.

## 2019-06-20 NOTE — Discharge Instructions (Addendum)
R groin site care instructions No vigorous or sexual activity for 1 week. Keep procedure site clean & dry. If you notice increased pain, swelling, bleeding or pus, call/return! No soaking in baths/hot tubs for 1 week.  (NO SHOWERS as noted below)   Please also not care and activity instructions below         Supplemental Discharge Instructions for  Pacemaker/Defibrillator Patients  Activity No heavy lifting or vigorous activity with your left/right arm for 6 to 8 weeks.  Do not raise your left/right arm above your head for one week.  Gradually raise your affected arm as drawn below.              06/24/2019                  06/25/2019                  06/26/2019                06/27/2019 __  NO DRIVING until cleared to at your wound check visit  [place] the wound area clean and dry.  Do not get this area wet for one week. No showers for one week; you may shower on  06/27/2019   . - The tape/steri-strips on your wound will fall off; do not pull them off.  No bandage is needed on the site.  DO  NOT apply any creams, oils, or ointments to the wound area. - If you notice any drainage or discharge from the wound, any swelling or bruising at the site, or you develop a fever > 101? F after you are discharged home, call the office at once.  Special Instructions - You are still able to use cellular telephones; use the ear opposite the side where you have your pacemaker/defibrillator.  Avoid carrying your cellular phone near your device. - When traveling through airports, show security personnel your identification card to avoid being screened in the metal detectors.  Ask the security personnel to use the hand wand. - Avoid arc welding equipment, MRI testing (magnetic resonance imaging), TENS units (transcutaneous nerve stimulators).  Call the office for questions about other devices. - Avoid electrical appliances that are in poor condition or are not properly grounded. - Microwave ovens are  safe to be near or to operate.

## 2019-06-20 NOTE — Progress Notes (Signed)
PROGRESS NOTE  Jillian Eaton HAL:[id number] DOB: [birth year] DOA: 06/14/2019 PCP: [name], MD  Brief Narrative:  69 year old with a history of SBO, HTN, fibromyalgia, obesity, and atrial fibrillation on Xarelto who presented with severe fatigue and unexplained weight loss.  She explains severe shortness of breath and fatigue dating back to March 9, following a knee surgery.  Previous work-ups via her PCP a hematologist and ER visits have been unrevealing.  She reported having intermittent tachycardia for the last month, sometimes heart rate go up to 140s, she has not been able to take Lopressor as prescribed due to borderline blood pressure at home  She started to have chest tightness feeling dizzy for the last week   Significant Events: 4/26 admit via [facility]  Antimicrobials:  None    HPI/Recap of past 24 hours:  She  remains  in A. fib, rate controlled at rest, but tachycardia with light activity She is frustrated due to poor tolerance to light activity, She is npo awaiting for av nodal ablation/ pacemaker placement    Assessment/Plan: Principal Problem:   Fatigue Active Problems:   Obesity (BMI 30-39.9)   Essential hypertension   Seizures (HCC)   Chronic atrial fibrillation (HCC)   Chronic pain syndrome   Depressive disorder   Chronic diastolic CHF (congestive heart failure) (HCC)   Hypothyroidism   Atrial fibrillation with RVR (HCC)  Fatigue (presenting symptom) -B35 and folic acid are normal  -a.m. serum cortisol is normal  -Hemoglobin 9.1 ( baseline 8-9 last year, it appeared better in early 2020), she reports getting iron infusion a few times -Fatigue likely due to A. 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   lost about 10-20lbs unintentionally over the past month due to reduced oral intake.  pt has experienced a 8.2% wt loss over the past month, which is significant for time frame.  no muscle wasting , no loss of subcutaneous fat At risk of malnutrition Nutrition input appreciated, her oral intake seems has improved  Seizure disorder Continue Keppra  Depression Continue home treatment regimen  FTT: reports she is the primary care giver to her husband who has progressive dementia, she recently rehomed her dog of 8years due to declined health, her husband is currently under the care of his family  DVT Prophylaxis: Eliquis  Code Status: Full  Family Communication: patient   Disposition Plan:    Patient came from:            Home  Anticipated d/c place:  Pending PT eval, clinical improvement  Barriers to d/c OR conditions which need to be met to effect a safe d/c:  Need cardiology clearance   Consultants:  Cardiology/EP  Procedures:  Unsuccessful cardioversion on April 28  Av nodal ablation/pacemaker placement  Antibiotics:  None   Objective: BP 96/71 (BP Location: Left Arm)   Pulse 82   Temp 97.8 F (36.6 C) (Oral)   Resp 17   Ht 5' 7"  (1.702  m)   Wt 90.3 kg   SpO2 100%   BMI 31.17 kg/m   Intake/Output Summary (Last 24 hours) at 06/20/2019 0708 Last data filed at 06/19/2019 2033 Gross per 24 hour  Intake 700 ml  Output 900 ml  Net -200 ml   Filed Weights   06/18/19 0631 06/19/19 0437 06/20/19 0627  Weight: 90.4 kg 90.5 kg 90.3 kg    Exam: Patient is examined daily including today on 06/20/2019, exams remain the same as of yesterday except that has changed    General:  Appear weak, but NAD  Cardiovascular: IRRR  Respiratory: CTABL  Abdomen: Soft/ND/NT, positive BS  Musculoskeletal: No Edema  Neuro: alert, oriented x3  Data Reviewed: Basic Metabolic Panel: Recent Labs  Lab 06/15/19 0640 06/16/19 0555 06/17/19 0437 06/18/19 0645 06/19/19 0358  NA 137 140 139 140 140  K 4.0 3.9 4.5 4.5 4.1  CL 103 102 104 106 107  CO2 26 29 28 27 26   GLUCOSE 66* 68* 67* 64* 78  BUN 11 9 11 13 16   CREATININE 1.26* 1.33* 1.28* 1.48* 1.48*  CALCIUM 8.1* 8.5* 8.3* 8.4* 8.1*  MG  --  1.8 2.2 2.1 2.0  PHOS  --  3.2  --   --   --    Liver Function Tests: Recent Labs  Lab 06/16/19 0555  AST 13*  ALT 9  ALKPHOS 70  BILITOT 0.6  PROT 6.0*  ALBUMIN 2.1*   No results for input(s): LIPASE, AMYLASE in the last 168 hours. No results for input(s): AMMONIA in the last 168 hours. CBC: Recent Labs  Lab 06/13/19 1639 06/14/19 1430 06/15/19 0640 06/16/19 0555 06/17/19 0437  WBC 8.9 7.0 6.5 4.3 4.7  NEUTROABS  --  5.4  --   --   --   HGB 11.2 9.7* 8.4* 9.5* 9.1*  HCT 36.9 32.0* 27.4* 30.7* 29.7*  MCV 92 91.2 90.4 90.6 90.8  PLT 360 320 276 321 290   Cardiac Enzymes:   No results for input(s): CKTOTAL, CKMB, CKMBINDEX, TROPONINI in the last 168 hours. BNP (last 3 results) Recent Labs    11/27/18 1235 12/06/18 1934 06/14/19 1430  BNP 239.3* 182.3* 141.0*    ProBNP (last 3 results) Recent Labs    06/13/19 1429  PROBNP 251.0*    CBG: No results for input(s): GLUCAP in the last 168 hours.  Recent  Results (from the past 240 hour(s))  Respiratory Panel by RT PCR (Flu A&B, Covid) - Nasopharyngeal Swab     Status: None   Collection Time: 06/14/19  8:49 PM   Specimen: Nasopharyngeal Swab  Result Value Ref Range Status   SARS Coronavirus 2 by RT PCR NEGATIVE NEGATIVE Final    Comment: (NOTE) SARS-CoV-2 target nucleic acids are NOT DETECTED. The SARS-CoV-2 RNA is generally detectable in upper respiratoy specimens during the acute phase of infection. The lowest concentration of SARS-CoV-2 viral copies this assay can detect is 131 copies/mL. A negative result does not preclude SARS-Cov-2 infection and should not be  used as the sole basis for treatment or other patient management decisions. A negative result may occur with  improper specimen collection/handling, submission of specimen other than nasopharyngeal swab, presence of viral mutation(s) within the areas targeted by this assay, and inadequate number of viral copies (<131 copies/mL). A negative result must be combined with clinical observations, patient history, and epidemiological information. The expected result is Negative. Fact Sheet for Patients:  PinkCheek.be Fact Sheet for Healthcare Providers:  GravelBags.it This test is not yet ap proved or cleared by the [place] FDA and  has been authorized for detection and/or diagnosis of SARS-CoV-2 by FDA under an Emergency Use Authorization (EUA). This EUA will remain  in effect (meaning this test can be used) for the duration of the COVID-19 declaration under Section 564(b)(1) of the Act, 21 U.S.C. section 360bbb-3(b)(1), unless the authorization is terminated or revoked sooner.    Influenza A by PCR NEGATIVE NEGATIVE Final   Influenza B by PCR NEGATIVE NEGATIVE Final    Comment: (NOTE) The Xpert Xpress SARS-CoV-2/FLU/RSV assay is intended as an aid in  the diagnosis of influenza from Nasopharyngeal swab specimens  and  should not be used as a sole basis for treatment. Nasal washings and  aspirates are unacceptable for Xpert Xpress SARS-CoV-2/FLU/RSV  testing. Fact Sheet for Patients: PinkCheek.be Fact Sheet for Healthcare Providers: GravelBags.it This test is not yet approved or cleared by the [place] FDA and  has been authorized for detection and/or diagnosis of SARS-CoV-2 by  FDA under an Emergency Use Authorization (EUA). This EUA will remain  in effect (meaning this test can be used) for the duration of the  Covid-19 declaration under Section 564(b)(1) of the Act, 21  U.S.C. section 360bbb-3(b)(1), unless the authorization is  terminated or revoked. Performed at [facility], [place] [address] [place], [place] [address]   Surgical PCR screen     Status: None   Collection Time: 06/19/19  4:27 PM   Specimen: Nasal Mucosa; Nasal Swab  Result Value Ref Range Status   MRSA, PCR NEGATIVE NEGATIVE Final   Staphylococcus aureus NEGATIVE NEGATIVE Final    Comment: (NOTE) The Xpert SA Assay (FDA approved for NASAL specimens in patients 16 years of age and older), is one component of a comprehensive surveillance program. It is not intended to diagnose infection nor to guide or monitor treatment. Performed at [facility], [place] [address], [place], [place] [address]      Studies: No results found.  Scheduled Meds: . calcitRIOL  0.5 mcg Oral BID  . docusate sodium  100 mg Oral BID  . DULoxetine  60 mg Oral Daily  . fluticasone furoate-vilanterol  1 puff Inhalation Daily  . gentamicin irrigation  80 mg Irrigation On Call  . levETIRAcetam  500 mg Oral Daily  . levothyroxine  150 mcg Oral Q0600  . magnesium oxide  400 mg Oral Daily  . metoprolol succinate  12.5 mg Oral Daily  . multivitamin with minerals  1 tablet Oral Daily  . sodium chloride flush  3 mL Intravenous Q12H  . sodium chloride flush  3 mL Intravenous Q12H   . sodium chloride flush  3 mL Intravenous Q12H  . traZODone  50 mg Oral QHS  . zolpidem  5 mg Oral QHS    Continuous Infusions: . sodium chloride    . sodium chloride    . sodium chloride    .  ceFAZolin (ANCEF) IV  Time spent: 85mns I have personally reviewed and interpreted on  06/20/2019 daily labs, tele strips, imagings as discussed above under date review session and assessment and plans.  I reviewed all nursing notes, pharmacy notes, consultant notes,  vitals, pertinent old records  I have discussed plan of care as described above with RN , patient  on 06/20/2019   FFlorencia ReasonsMD, PhD, FACP  [facility]  Available via Epic secure chat 7am-7pm for nonurgent issues Please page for urgent issues, pager number available through [email or web address] .   06/20/2019, 7:08 AM  LOS: 5 days

## 2019-06-21 ENCOUNTER — Inpatient Hospital Stay (HOSPITAL_COMMUNITY): Payer: Medicare Other

## 2019-06-21 LAB — BASIC METABOLIC PANEL
Anion gap: 6 (ref 5–15)
BUN: 15 mg/dL (ref 8–23)
CO2: 26 mmol/L (ref 22–32)
Calcium: 8.2 mg/dL — ABNORMAL LOW (ref 8.9–10.3)
Chloride: 107 mmol/L (ref 98–111)
Creatinine, Ser: 1.21 mg/dL — ABNORMAL HIGH (ref 0.44–1.00)
GFR calc Af Amer: 54 mL/min — ABNORMAL LOW (ref >60)
GFR calc non Af Amer: 46 mL/min — ABNORMAL LOW (ref >60)
Glucose, Bld: 79 mg/dL (ref 70–99)
Potassium: 4 mmol/L (ref 3.5–5.1)
Sodium: 139 mmol/L (ref 135–145)

## 2019-06-21 LAB — CBC WITH DIFFERENTIAL/PLATELET
Abs Immature Granulocytes: 0.01 10*3/uL (ref 0.00–0.07)
Basophils Absolute: 0 10*3/uL (ref 0.0–0.1)
Basophils Relative: 1 %
Eosinophils Absolute: 0.1 10*3/uL (ref 0.0–0.5)
Eosinophils Relative: 2 %
HCT: 29.9 % — ABNORMAL LOW (ref 36.0–46.0)
Hemoglobin: 9.2 g/dL — ABNORMAL LOW (ref 12.0–15.0)
Immature Granulocytes: 0 %
Lymphocytes Relative: 23 %
Lymphs Abs: 1.2 10*3/uL (ref 0.7–4.0)
MCH: 27.6 pg (ref 26.0–34.0)
MCHC: 30.8 g/dL (ref 30.0–36.0)
MCV: 89.8 fL (ref 80.0–100.0)
Monocytes Absolute: 0.5 10*3/uL (ref 0.1–1.0)
Monocytes Relative: 10 %
Neutro Abs: 3.2 10*3/uL (ref 1.7–7.7)
Neutrophils Relative %: 64 %
Platelets: 220 10*3/uL (ref 150–400)
RBC: 3.33 MIL/uL — ABNORMAL LOW (ref 3.87–5.11)
RDW: 16 % — ABNORMAL HIGH (ref 11.5–15.5)
WBC: 5.1 10*3/uL (ref 4.0–10.5)
nRBC: 0 % (ref 0.0–0.2)

## 2019-06-21 MED ORDER — APIXABAN 5 MG PO TABS: 5.0000 mg | ORAL_TABLET | Freq: Two times a day (BID) | ORAL | 0 refills | Status: DC

## 2019-06-21 NOTE — Progress Notes (Signed)
Nsg Discharge Note  Admit Date:  06/14/2019 Discharge date: 06/21/2019   Hebert Soho Liberman to be D/C'd home per MD order.  AVS completed.  Copy for chart, and copy for patient signed, and dated. Patient/caregiver able to verbalize understanding.  Discharge Medication: Allergies as of 06/21/2019      Reactions   Hydrocodone Itching         Medication List    STOP taking these medications   ferrous sulfate 325 (65 FE) MG tablet Commonly known as: FerrouSul   metoprolol succinate 25 MG 24 hr tablet Commonly known as: TOPROL-XL     TAKE these medications   acetaminophen 500 MG tablet Commonly known as: TYLENOL Take 2 tablets (1,000 mg total) by mouth every 8 (eight) hours. What changed: when to take this   apixaban 5 MG Tabs tablet Commonly known as: Eliquis Take 1 tablet (5 mg total) by mouth 2 (two) times daily. Please resume eliquis on 5/5 pm, Start taking on: Jun 25, 2019 What changed:   how much to take  additional instructions  These instructions start on Jun 25, 2019. If you are unsure what to do until then, ask your doctor or other care provider.   Breo Ellipta 100-25 MCG/INH Aepb Generic drug: fluticasone furoate-vilanterol Inhale 1 puff into the lungs daily.   calcitRIOL 0.5 MCG capsule Commonly known as: ROCALTROL Take 0.5 mcg by mouth 2 (two) times daily.   clotrimazole 1 % cream Commonly known as: LOTRIMIN APPLY TO AFFECTED AREA TWICE A DAY What changed: See the new instructions.   cyanocobalamin 1000 MCG/ML injection Commonly known as: (VITAMIN B-12) Inject 1 mL (1,000 mcg total) into the muscle every 30 (thirty) days.   cyclobenzaprine 10 MG tablet Commonly known as: FLEXERIL Take 2 tablets (20 mg total) by mouth 2 (two) times daily. What changed: when to take this   DULoxetine 60 MG capsule Commonly known as: CYMBALTA Take 1 capsule (60 mg total) by mouth daily.   eszopiclone 2 MG Tabs tablet Commonly known as: LUNESTA Take 1 tablet  (2 mg total) by mouth at bedtime.   furosemide 20 MG tablet Commonly known as: LASIX Take 1 tablet (20 mg total) by mouth as needed. What changed:   when to take this  reasons to take this   levETIRAcetam 500 MG tablet Commonly known as: Keppra Take 1 tablet (500 mg total) by mouth 2 (two) times daily.   levothyroxine 150 MCG tablet Commonly known as: SYNTHROID TAKE 1 TABLET BY MOUTH EVERY DAY What changed: when to take this   magnesium oxide 400 (241.3 Mg) MG tablet Commonly known as: MAG-OX Take 400 mg by mouth daily.   NONFORMULARY OR COMPOUNDED ITEM [facility]:  Antifungal Cream - Terbinafine 3%, Fluconazole 2%, Tea Tree Oil 5%, Urea 10%, Ibuprofen 2% in DMSO Suspension #45ml. Apply to affected toenail(s) once daily (at bedtime) or twice daily. What changed:   how much to take  how to take this  when to take this  additional instructions   traZODone 50 MG tablet Commonly known as: DESYREL Take 1 tablet (50 mg total) by mouth at bedtime.   Vitamin D (Ergocalciferol) 1.25 MG (50000 UNIT) Caps capsule Commonly known as: DRISDOL Take 1 capsule (50,000 Units total) by mouth every Wednesday.       Discharge Assessment: Vitals:   06/21/19 1204 06/21/19 1209  BP: (!) 84/56 105/73  Pulse: 79 88  Resp: 16 16  Temp: 98 F (36.7 C) 98 F (36.7 C)  SpO2: 96% 96%   Skin clean, dry and intact without evidence of skin break down, no evidence of skin tears noted. IV catheter discontinued intact. Site without signs and symptoms of complications - no redness or edema noted at insertion site, patient denies c/o pain - only slight tenderness at site.  Dressing with slight pressure applied.  D/c Instructions-Education: Discharge instructions given to patient/family with verbalized understanding. D/c education completed with patient/family including follow up instructions, medication list, d/c activities limitations if indicated, with other d/c instructions as  indicated by MD - patient able to verbalize understanding, all questions fully answered. Patient instructed to return to ED, call 911, or call MD for any changes in condition.  Patient escorted via [place], and D/C home via private auto.  Atilano Ina, RN 06/21/2019 12:54 PM

## 2019-06-21 NOTE — Progress Notes (Signed)
Patient Name: Jillian Eaton [facility]   Patient Profile     69 y.o. female with a hx of fibromyalgia, normal coronaries by cath 2012, kidney stones, p.HTN on home O2, 2018 she had an unintentional narcotic OD resulting In aspiration pneumonia, echo then noted LVEF 25%, though has had subsequent improvement in LVEF, chronic CHF (diastolic), HTN, hypothyroidism (s/p thyroidectomy 2/2 goiter), morbid obesity (s/[place]), Iron def anemia, CKD (III), and Afib (felt to be permanent) RVR  Failed amiodarone and dofelide S/p LBBB area pacing and AV ablation      SUBJECTIVE: feels great Some shoulder discomfort    Past Medical History:  Diagnosis Date  . Anemia    iron and pernicious  . Arthritis   . Asthma    flare up last yr  . CHF (congestive heart failure) ([place])   . Chronic headache    " better now "   . Complication of anesthesia    BP dropped   . DOE (dyspnea on exertion)    2D ECHO, 02/12/2012 - EF 60-65%, moderate concentric hypertrophy  . Dysrhythmia   . Fibromyalgia    nerve pain"left side at waist level" "can't lay on that side without pain" , "HOB elevation helps"; pt. thinks this has resolved after 200lb weight loss9-17- since i lost the wieght , i dont  think i have this anymore   . Heart murmur   . Hematuria - cause not known    resolved   . History of kidney stones    x 2 '13, '14 surgery to remove  . Hypertension   . Pneumonia    aspiration  04/2016  . SBO (small bowel obstruction) ([place])    rqueired admission 2020  . Thyroid disease    "goiter"  . Transfusion history    10 yrs+    Scheduled Meds:  Scheduled Meds: . calcitRIOL  0.5 mcg Oral BID  . docusate sodium  100 mg Oral BID  . DULoxetine  60 mg Oral Daily  . fluticasone furoate-vilanterol  1 puff Inhalation Daily  . levETIRAcetam  500 mg Oral Daily  . levothyroxine  150 mcg Oral Q0600  . magnesium oxide  400 mg Oral Daily  . multivitamin with  minerals  1 tablet Oral Daily  . sodium chloride flush  3 mL Intravenous Q12H  . sodium chloride flush  3 mL Intravenous Q12H  . sodium chloride flush  3 mL Intravenous Q12H  . traZODone  50 mg Oral QHS  . zolpidem  5 mg Oral QHS   Continuous Infusions: . sodium chloride     sodium chloride, acetaminophen, albuterol, bisacodyl, cyclobenzaprine, hydrALAZINE, hydrocortisone cream, morphine injection, ondansetron **OR** ondansetron (ZOFRAN) IV, polyethylene glycol, sodium chloride flush, zolpidem    PHYSICAL EXAM Vitals:   06/20/19 2030 06/21/19 0412 06/21/19 0809 06/21/19 1016  BP: (!) 85/67 (!) 85/57  (!) 91/59  Pulse: 80 80 81 79  Resp: 19 19 16 16   Temp:  98 F (36.7 C)  98.2 F (36.8 C)  TempSrc:  Oral  Oral  SpO2: 100% 92% 95% 97%  Weight:  88.1 kg    Height:        Well developed and well nourished in no acute distress HENT normal Neck supple with JVP-flat Clear P[place] without  hematoma, swelling or tenderness   Regular rate and rhythm, no  Murmur Groin site without hematoma Abd-soft with active BS No Clubbing cyanosis  edema Skin-warm and  dry A & Oriented  Grossly normal sensory and motor function  ECG afib with Vpacing QRSd 130 msec   TELEMETRY: Reviewed personnally pt in V pacing   Intake/Output Summary (Last 24 hours) at 06/21/2019 1029 Last data filed at 06/21/2019 0805 Gross per 24 hour  Intake 720 ml  Output 900 ml  Net -180 ml    LABS: Basic Metabolic Panel: Recent Labs  Lab 06/14/19 1430 06/14/19 1430 06/15/19 0640 06/15/19 0640 06/16/19 0555 06/16/19 0555 06/17/19 0437 06/17/19 0437 06/18/19 0645 06/18/19 0645 06/19/19 0358 06/21/19 0455  NA 138  --  137  --  140  --  139  --  140  --  140 139  K 4.3  --  4.0  --  3.9  --  4.5  --  4.5  --  4.1 4.0  CL 101  --  103  --  102  --  104  --  106  --  107 107  CO2 25  --  26  --  29  --  28  --  27  --  26 26  GLUCOSE 82  --  66*  --  68*  --  67*  --  64*  --  78 79  BUN 17  --  11  --   9  --  11  --  13  --  16 15  CREATININE 1.23*  --  1.26*  --  1.33*  --  1.28*  --  1.48*  --  1.48* 1.21*  CALCIUM 8.3*   < > 8.1*   < > 8.5*   < > 8.3*   < > 8.4*   < > 8.1* 8.2*  MG  --   --   --   --  1.8   < > 2.2   < > 2.1  --  2.0  --   PHOS  --   --   --   --  3.2  --   --   --   --   --   --   --    < > = values in this interval not displayed.   Cardiac Enzymes: No results for input(s): CKTOTAL, CKMB, CKMBINDEX, TROPONINI in the last 72 hours. CBC: Recent Labs  Lab 06/14/19 1430 06/15/19 0640 06/16/19 0555 06/17/19 0437 06/21/19 0455  WBC 7.0 6.5 4.3 4.7 5.1  NEUTROABS 5.4  --   --   --  3.2  HGB 9.7* 8.4* 9.5* 9.1* 9.2*  HCT 32.0* 27.4* 30.7* 29.7* 29.9*  MCV 91.2 90.4 90.6 90.8 89.8  PLT 320 276 321 290 220   PROTIME: No results for input(s): LABPROT, INR in the last 72 hours. Liver Function Tests: No results for input(s): AST, ALT, ALKPHOS, BILITOT, PROT, ALBUMIN in the last 72 hours. No results for input(s): LIPASE, AMYLASE in the last 72 hours. BNP: BNP (last 3 results) Recent Labs    11/27/18 1235 12/06/18 1934 06/14/19 1430  BNP 239.3* 182.3* 141.0*    ProBNP (last 3 results) Recent Labs    06/13/19 1429  PROBNP 251.0*    D-Dimer: No results for input(s): DDIMER in the last 72 hours. Hemoglobin A1C: No results for input(s): HGBA1C in the last 72 hours. Fasting Lipid Panel: No results for input(s): CHOL, HDL, LDLCALC, TRIG, CHOLHDL, LDLDIRECT in the last 72 hours. Thyroid Function Tests: No results for input(s): TSH, T4TOTAL, T3FREE, THYROIDAB in the last 72 hours.  Invalid input(s): FREET3 Anemia Panel: No  results for input(s): VITAMINB12, FOLATE, FERRITIN, TIBC, IRON, RETICCTPCT in the last 72 hours.   Device Interrogation: normal device function ECG QRS d 130 Msec  ASSESSMENT AND PLAN:  Principal Problem:   Fatigue Active Problems:   Obesity (BMI 30-39.9)   Essential hypertension   Seizures (HCC)   Chronic atrial fibrillation  (HCC)   Chronic pain syndrome   Depressive disorder   Chronic diastolic CHF (congestive heart failure) ([place])   Hypothyroidism   Atrial fibrillation with RVR ([place])  S/p AV ablation with pacing  Relatively narrow QRS   Instructions given  Ok to discharge from my perspective   Resume [facility]  On Wednesday PM ( no prior stroke)   Not sure why BP chronically low >> cortisol normal   Signed, Virl Axe MD  06/21/2019

## 2019-06-21 NOTE — Discharge Summary (Signed)
Discharge Summary  Jillian Eaton [facility] [id number] DOB: [birth year]  PCP: [name], MD  Admit date: 06/14/2019 Discharge date: 06/21/2019  Time spent: 60mins, more than 50% time spent on coordination of care.  Recommendations for Outpatient Follow-up:  1. F/u with PCP within a week  for hospital discharge follow up, repeat cbc/bmp at follow up 2. F/u with cardiology and EP  Discharge Diagnoses:  Active Hospital Problems   Diagnosis Date Noted  . Fatigue 06/14/2019  . Atrial fibrillation with RVR ([place]) 06/15/2019  . Hypothyroidism 12/07/2018  . Chronic diastolic CHF (congestive heart failure) ([place]) 09/28/2017  . Depressive disorder 08/28/2016  . Chronic pain syndrome   . Chronic atrial fibrillation ([place]) 04/28/2016  . Seizures ([place])   . Essential hypertension 05/11/2015  . Obesity (BMI 30-39.9) 09/22/2010    Resolved Hospital Problems  No resolved problems to display.    Discharge Condition: stable  Diet recommendation: heart healthy  Filed Weights   06/19/19 0437 06/20/19 0627 06/21/19 0412  Weight: 90.5 kg 90.3 kg 88.1 kg    History of present illness: (Per admitting MD Dr. Lorin Mercy) PCP: [name], MD Consultants:  [facility] - cardiology; [facility] - pulmonology; Alvan Dame - orthopedics; Cruzita Lederer - endocrinology; Johnney Ou - nephrology; [facility] - oncology Patient coming from:  Home - lives with husband (suffering from dementia); NOK: Daughter, Garnette Scheuermann, [phone]  Chief Complaint: Fatigue and unexplained weight loss  HPI: Jillian Eaton is a 69 y.o. female with medical history significant of SBO; HTN; fibromyalgia; obesity (BMI 32); PAF on Xarelto; and chronic systolic CHF presenting with fatigue and unexplained weight loss.  She reports since her knee surgery (3/9) maybe a week later she started having breathing problems.  She had difficulty doing exercises due to SOB.  She saw her PCP and had blood work.  He thought maybe it was asthma  and she needed a breathing treatment (but she hasn't needed that since she was 14).  Went to hematology, no significant findings.  She went to the ER and gave up after 6 hours on 4/12.  She returned to the ER on 4/15; she again had blood work and Insurance account manager and said that she was in afib and maybe that was the problem but she didn't need admission.  These were the same symptoms she had when she was admitted in October for CHF exacerbation and after that she "got amazingly better, whatever they did."  This week, she saw her pulmonologist (continue Breo and stop pain meds - Dilaudid for her knee) and cardiologist.  Cards was very concerned because she was so winded in walking short distances.  Her WBC count was high, so there were concerned about infection; she doesn't feel like she has infection.  They called her this morning and said if she felt better she should come to the ER.     ED Course:   1 month of fatigue, nausea, weight loss, elevated HR with minimal exertion.  Saw pulm and cards this week.  ?worsening cardiomyopathy, echo for Monday.  Unremarkable labs, CXR.  Dr. Margaretann Loveless will consult, think not cardiac.  TSH 7 yesterday, T3/T4 pending.   Hospital Course:  Principal Problem:   Fatigue Active Problems:   Obesity (BMI 30-39.9)   Essential hypertension   Seizures (HCC)   Chronic atrial fibrillation (HCC)   Chronic pain syndrome   Depressive disorder   Chronic diastolic CHF (congestive heart failure) (HCC)   Hypothyroidism   Atrial fibrillation with RVR (HCC)   Fatigue (presenting  symptom) -[id number] and folic acid are normal -a.m. serum cortisol is normal -Hemoglobin 9.1 ( baseline 8-9 last year, it appeared better in early 2020), she reports getting iron infusion a few times -Fatigue likely due to A. 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   lost about 10-20lbs unintentionally over the past month due to reduced oral intake.  pt has experienced a 8.2% wt loss over the past month, which is significant for time frame. no muscle wasting , no loss of subcutaneous fat At risk of malnutrition Nutrition input appreciated, her oral intake seems has improved  Seizure disorder Continue Keppra  Depression Continue home treatment regimen  FTT: reports she is the primary care giver to her husband who has progressive dementia, she recently rehomed her dog of 8years due to declined health, her husband is currently under the care of his family  DVT Prophylaxis: Eliquis  Code Status: Full  Family Communication: patient   Disposition Plan:    Patient came from: Home  Anticipated d/c place:  Home     Consultants:  Cardiology/EP  Procedures:  Unsuccessful cardioversion on April 28  Av nodal ablation/pacemaker placement  Antibiotics:  None  Discharge Exam: BP 105/73 (BP Location: Right Arm)   Pulse 88   Temp 98 F (36.7 C) (Oral)   Resp 16   Ht 5\' 7"  (1.702 m)   Wt 88.1 kg   SpO2 96%   BMI 30.43 kg/m    General:  Appear stronger, NAD  Cardiovascular:  Paced  rhythm  Respiratory: CTABL  Abdomen: Soft/ND/NT, positive BS  Musculoskeletal: No Edema  Neuro: alert, oriented x3   Discharge Instructions You were cared for by a hospitalist during your hospital stay. If you have any questions about your discharge medications or the care you received while you were in the hospital after you are discharged, you can call the unit and asked to speak with the hospitalist on call if the hospitalist that took care of you is not available. Once you are discharged, your primary care physician will handle any further medical issues. Please note that NO REFILLS for any discharge medications will be authorized once you are discharged, as it is imperative that you return to your primary care physician (or establish a relationship with a primary care physician if you do not have one) for your aftercare needs so that they can reassess your need for medications and monitor your lab values.  Discharge Instructions    Ambulatory referral to Physical Therapy   Complete by: As directed    Diet - low sodium heart healthy   Complete by: As directed    Increase activity slowly   Complete by: As directed      Allergies as of 06/21/2019      Reactions   Hydrocodone Itching  Medication List    STOP taking these medications   ferrous sulfate 325 (65 FE) MG tablet Commonly known as: FerrouSul   metoprolol succinate 25 MG 24 hr tablet Commonly known as: TOPROL-XL     TAKE these medications   acetaminophen 500 MG tablet Commonly known as: TYLENOL Take 2 tablets (1,000 mg total) by mouth every 8 (eight) hours. What changed: when to take this   apixaban 5 MG Tabs tablet Commonly known as: Eliquis Take 1 tablet (5 mg total) by mouth 2 (two) times daily. Please resume eliquis on 5/5 pm, Start taking on: Jun 25, 2019 What changed:   how much to take  additional instructions  These instructions start on Jun 25, 2019. If you are unsure what to do until then,  ask your doctor or other care provider.   Breo Ellipta 100-25 MCG/INH Aepb Generic drug: fluticasone furoate-vilanterol Inhale 1 puff into the lungs daily.   calcitRIOL 0.5 MCG capsule Commonly known as: ROCALTROL Take 0.5 mcg by mouth 2 (two) times daily.   clotrimazole 1 % cream Commonly known as: LOTRIMIN APPLY TO AFFECTED AREA TWICE A DAY What changed: See the new instructions.   cyanocobalamin 1000 MCG/ML injection Commonly known as: (VITAMIN B-12) Inject 1 mL (1,000 mcg total) into the muscle every 30 (thirty) days.   cyclobenzaprine 10 MG tablet Commonly known as: FLEXERIL Take 2 tablets (20 mg total) by mouth 2 (two) times daily. What changed: when to take this   DULoxetine 60 MG capsule Commonly known as: CYMBALTA Take 1 capsule (60 mg total) by mouth daily.   eszopiclone 2 MG Tabs tablet Commonly known as: LUNESTA Take 1 tablet (2 mg total) by mouth at bedtime.   furosemide 20 MG tablet Commonly known as: LASIX Take 1 tablet (20 mg total) by mouth as needed. What changed:   when to take this  reasons to take this   levETIRAcetam 500 MG tablet Commonly known as: Keppra Take 1 tablet (500 mg total) by mouth 2 (two) times daily.   levothyroxine 150 MCG tablet Commonly known as: SYNTHROID TAKE 1 TABLET BY MOUTH EVERY DAY What changed: when to take this   magnesium oxide 400 (241.3 Mg) MG tablet Commonly known as: MAG-OX Take 400 mg by mouth daily.   NONFORMULARY OR COMPOUNDED ITEM [facility]:  Antifungal Cream - Terbinafine 3%, Fluconazole 2%, Tea Tree Oil 5%, Urea 10%, Ibuprofen 2% in DMSO Suspension #60ml. Apply to affected toenail(s) once daily (at bedtime) or twice daily. What changed:   how much to take  how to take this  when to take this  additional instructions   traZODone 50 MG tablet Commonly known as: DESYREL Take 1 tablet (50 mg total) by mouth at bedtime.   Vitamin D (Ergocalciferol) 1.25 MG (50000 UNIT) Caps  capsule Commonly known as: DRISDOL Take 1 capsule (50,000 Units total) by mouth every Wednesday.      Allergies  Allergen Reactions  . Hydrocodone Itching        Follow-up Information    Bakare, Mobolaji B, MD Follow up.   Specialty: Internal Medicine Why: please make apt  Contact information: [place] [id number] [phone]        [facility] Follow up.   Specialty: Cardiology Why: 07/03/2019 @ 10:30AM, wound check visit Contact information: [address], [address] [place]       Shirley Friar, PA-C Follow up.  Specialty: Physician Assistant Why: 07/22/2019 @ 12:10PM, pacemaker visit Contact information: [address] [place] [address] [phone]        Evans Lance, MD Follow up.   Specialty: Cardiology Why: 09/24/2019 @ 2:00PM Contact information: [id number] [place] [address] [phone]            The results of significant diagnostics from this hospitalization (including imaging, microbiology, ancillary and laboratory) are listed below for reference.    Significant Diagnostic Studies: DG Chest 2 View  Result Date: 06/21/2019 CLINICAL DATA:  Pacemaker EXAM: CHEST - 2 VIEW COMPARISON:  06/14/2019 FINDINGS: Interval placement of left chest wall single lead pacemaker. Lead overlies the right ventricle. No pneumothorax. Lungs remain clear. Stable cardiomediastinal contours. IMPRESSION: Left chest wall single lead pacemaker with lead overlying the right ventricle. No pneumothorax. Electronically Signed   By: Macy Mis M.D.   On: 06/21/2019 07:23   DG Chest 2 View  Result Date: 06/02/2019 CLINICAL DATA:  Tachycardia with shortness of breath and loss of appetite. EXAM: CHEST - 2 VIEW COMPARISON:  Radiographs 12/06/2018. CT 12/06/2018. FINDINGS: Stable moderate cardiac enlargement and mediastinal  contours. The lungs are clear. There is no pleural effusion or pneumothorax. No acute osseous findings are evident post right shoulder reverse arthroplasty. Postsurgical changes are present in the upper abdomen. IMPRESSION: Stable moderate cardiac enlargement. No acute cardiopulmonary process. Electronically Signed   By: Richardean Sale M.D.   On: 06/02/2019 13:43   CT CHEST W CONTRAST  Result Date: 06/14/2019 CLINICAL DATA:  Nausea, vomiting and shortness of breath. EXAM: CT CHEST, ABDOMEN, AND PELVIS WITH CONTRAST TECHNIQUE: Multidetector CT imaging of the chest, abdomen and pelvis was performed following the standard protocol during bolus administration of intravenous contrast. CONTRAST:  140mL OMNIPAQUE IOHEXOL 300 MG/ML  SOLN COMPARISON:  None. FINDINGS: CT CHEST FINDINGS Cardiovascular: No intraluminal filling defects are seen within the pulmonary arteries. The pulmonary inflow tract measures approximately 3.8 cm while the right pulmonary artery measures 3.8 cm. Normal heart size. No pericardial effusion. Mediastinum/Nodes: No enlarged mediastinal, hilar, or axillary lymph nodes. Thyroid gland, trachea, and esophagus demonstrate no significant findings. Lungs/Pleura: Mild linear scarring and/or atelectasis is seen within the right lower lobe. There is no evidence of a pleural effusion or pneumothorax. Musculoskeletal: A right shoulder prosthesis is seen with associated streak artifact. Multilevel degenerative changes seen throughout the thoracic spine. CT ABDOMEN PELVIS FINDINGS Hepatobiliary: No focal liver abnormality is seen. Status post cholecystectomy. No biliary dilatation. Pancreas: Unremarkable. No pancreatic ductal dilatation or surrounding inflammatory changes. Spleen: Normal in size without focal abnormality. Adrenals/Urinary Tract: Adrenal glands are unremarkable. There is mild asymmetric enlargement of the right kidney. The left kidney is normal in size. There is marked severity right-sided  hydronephrosis and hydroureter with delayed renal cortical enhancement seen throughout the right kidney. A 9 mm obstructing renal stone is seen in the distal right ureter, with additional 7 mm obstructing renal stone seen at the right UVJ. Bladder is unremarkable. Stomach/Bowel: Multiple surgical clips and surgical sutures are seen within the gastric region. There is a small hiatal hernia. Surgically anastomosed bowel is seen within the anterior aspect of the right upper quadrant. Appendix appears normal. No evidence of bowel wall thickening, distention, or inflammatory changes. Vascular/Lymphatic: No significant vascular findings are present. No enlarged abdominal or pelvic lymph nodes. Reproductive: Multiple coarse calcifications of various sizes are seen scattered throughout the uterus. Other: No abdominal wall hernia or abnormality. No abdominopelvic  ascites. Musculoskeletal: Multilevel degenerative changes are seen throughout the lumbar spine. IMPRESSION: 1. 9 mm obstructing renal stone in the distal right ureter, with additional 7 mm obstructing renal stone seen at the right UVJ. 2. Evidence of prior gastric bypass surgery. 3. Small hiatal hernia. 4. No acute or active cardiopulmonary disease. Electronically Signed   By: Virgina Norfolk M.D.   On: 06/14/2019 19:33   CT ABDOMEN PELVIS W CONTRAST  Result Date: 06/14/2019 CLINICAL DATA:  Weakness, fatigue and increased heart rate. EXAM: CT CHEST, ABDOMEN, AND PELVIS WITH CONTRAST TECHNIQUE: Multidetector CT imaging of the chest, abdomen and pelvis was performed following the standard protocol during bolus administration of intravenous contrast. CONTRAST:  136mL OMNIPAQUE IOHEXOL 300 MG/ML  SOLN COMPARISON:  January 04, 2019 FINDINGS: CT CHEST FINDINGS Cardiovascular: No intraluminal filling defects are seen within the pulmonary arteries. The pulmonary inflow tract measures approximately 3.8 cm while the right pulmonary artery measures 3.8 cm. Normal heart  size. No pericardial effusion. Mediastinum/Nodes: Lungs/Pleura: Mild linear scarring and/or atelectasis is seen within the right lower lobe. There is no evidence of a pleural effusion or pneumothorax. Musculoskeletal: A right shoulder prosthesis is seen with associated streak artifact. Multilevel degenerative changes seen throughout the thoracic spine. CT ABDOMEN PELVIS FINDINGS Hepatobiliary: No focal liver abnormality is seen. Status post cholecystectomy. No biliary dilatation. Pancreas: Unremarkable. No pancreatic ductal dilatation or surrounding inflammatory changes. Spleen: Normal in size without focal abnormality. Adrenals/Urinary Tract: Adrenal glands are unremarkable. There is mild asymmetric enlargement of the right kidney. The left kidney is normal in size. There is marked severity right-sided hydronephrosis and hydroureter with delayed renal cortical enhancement seen throughout the right kidney. A 9 mm obstructing renal stone is seen in the distal right ureter, with additional 7 mm obstructing renal stone seen at the right UVJ. Bladder is unremarkable. Stomach/Bowel: Multiple surgical clips and surgical sutures are seen within the gastric region. There is a small hiatal hernia. Surgically anastomosed bowel is seen within the anterior aspect of the right upper quadrant. Appendix appears normal. No evidence of bowel wall thickening, distention, or inflammatory changes. Vascular/Lymphatic: No significant vascular findings are present. No enlarged abdominal or pelvic lymph nodes. Reproductive: Multiple coarse calcifications of various sizes are seen scattered throughout the uterus. Other: No abdominal wall hernia or abnormality. No abdominopelvic ascites. Musculoskeletal: Multilevel degenerative changes are seen throughout the lumbar spine. IMPRESSION: 1. 9 mm obstructing renal stone in the distal right ureter, with additional 7 mm obstructing renal stone seen at the right UVJ. 2. Evidence of prior gastric  bypass surgery. 3. Small hiatal hernia. 4. No acute or active cardiopulmonary disease. Electronically Signed   By: Virgina Norfolk M.D.   On: 06/14/2019 19:31   EP STUDY  Result Date: 06/20/2019 Conclusion: Successful insertion of a single-chamber pacemaker followed by AV node ablation in a patient with uncontrolled atrial fibrillation who failed cardioversion with dofetilide and previously had failed amiodarone and whose ambulatory rates were uncontrolled despite maximal AV nodal blocking therapy. Cristopher Peru, MD  EP PPM/ICD IMPLANT  Result Date: 06/20/2019 Conclusion: Successful insertion of a single-chamber pacemaker followed by AV node ablation in a patient with uncontrolled atrial fibrillation who failed cardioversion with dofetilide and previously had failed amiodarone and whose ambulatory rates were uncontrolled despite maximal AV nodal blocking therapy. Cristopher Peru, MD  DG Chest Port 1 View  Result Date: 06/14/2019 CLINICAL DATA:  Cough. EXAM: PORTABLE CHEST 1 VIEW COMPARISON:  June 05, 2019 FINDINGS: Stable cardiomegaly. The hila and mediastinum are  unchanged. No pneumothorax. No nodules or masses. No focal infiltrates or overt edema. IMPRESSION: No active disease. Electronically Signed   By: Dorise Bullion III M.D   On: 06/14/2019 14:50   DG Chest Portable 1 View  Result Date: 06/05/2019 CLINICAL DATA:  Arrhythmia, short of breath EXAM: PORTABLE CHEST 1 VIEW COMPARISON:  06/02/2019 FINDINGS: Single frontal view of the chest again demonstrates an enlarged cardiac silhouette. No airspace disease, effusion, or pneumothorax. No acute bony abnormalities. IMPRESSION: 1. Stable enlarged cardiac silhouette.  No acute process. Electronically Signed   By: Randa Ngo M.D.   On: 06/05/2019 19:03   ECHOCARDIOGRAM COMPLETE  Result Date: 06/15/2019    ECHOCARDIOGRAM REPORT   Patient Name:   MACILYNN HUSSAIN Hubbard Date of Exam: 06/15/2019 Medical Rec #:  [id number]                Height:        67.0 in Accession #:    [id number]               Weight:       204.8 lb Date of Birth:  May 22, 1950                BSA:          2.043 m Patient Age:    3 years                 BP:           113/79 mmHg Patient Gender: F                        HR:           75 bpm. Exam Location:  Inpatient Procedure: 2D Echo, Cardiac Doppler and Color Doppler Indications:    R06.02 SOB  History:        Patient has prior history of Echocardiogram examinations, most                 recent 12/08/2018.  Sonographer:    Merrie Roof RDCS Referring Phys: [place]  1. Left ventricular ejection fraction, by estimation, is 60 to 65%. The left ventricle has normal function. The left ventricle has no regional wall motion abnormalities. Left ventricular diastolic parameters are indeterminate.  2. Right ventricular systolic function is normal. The right ventricular size is mildly enlarged. There is moderately elevated pulmonary artery systolic pressure. The estimated right ventricular systolic pressure is [id number] mmHg.  3. Left atrial size was moderately dilated.  4. Right atrial size was moderately dilated.  5. The mitral valve is normal in structure. Mild mitral valve regurgitation. No evidence of mitral stenosis.  6. Tricuspid valve regurgitation is moderate.  7. The aortic valve is tricuspid. Aortic valve regurgitation is trivial. Mild aortic valve sclerosis is present, with no evidence of aortic valve stenosis.  8. The inferior vena cava is normal in size with greater than 50% respiratory variability, suggesting right atrial pressure of 3 mmHg. Comparison(s): No significant change from prior study. Prior images reviewed side by side. FINDINGS  Left Ventricle: Left ventricular ejection fraction, by estimation, is 60 to 65%. The left ventricle has normal function. The left ventricle has no regional wall motion abnormalities. The left ventricular internal cavity size was normal in size. There is  no left ventricular  hypertrophy. Left ventricular diastolic parameters are indeterminate. Right Ventricle: The right ventricular size is mildly enlarged. No increase in right ventricular wall thickness.  Right ventricular systolic function is normal. There is moderately elevated pulmonary artery systolic pressure. The tricuspid regurgitant velocity is 3.19 m/s, and with an assumed right atrial pressure of 10 mmHg, the estimated right ventricular systolic pressure is [id number] mmHg. Left Atrium: Left atrial size was moderately dilated. Right Atrium: Right atrial size was moderately dilated. Pericardium: There is no evidence of pericardial effusion. Mitral Valve: The mitral valve is normal in structure. Normal mobility of the mitral valve leaflets. Moderate mitral annular calcification. Mild mitral valve regurgitation. No evidence of mitral valve stenosis. Tricuspid Valve: The tricuspid valve is normal in structure. Tricuspid valve regurgitation is moderate . No evidence of tricuspid stenosis. Aortic Valve: The aortic valve is tricuspid. Aortic valve regurgitation is trivial. Mild aortic valve sclerosis is present, with no evidence of aortic valve stenosis. Pulmonic Valve: The pulmonic valve was normal in structure. Pulmonic valve regurgitation is not visualized. No evidence of pulmonic stenosis. Aorta: The aortic root is normal in size and structure. Venous: The inferior vena cava is normal in size with greater than 50% respiratory variability, suggesting right atrial pressure of 3 mmHg. IAS/Shunts: No atrial level shunt detected by color flow Doppler.  LEFT VENTRICLE PLAX 2D LVIDd:         4.40 cm LVIDs:         3.10 cm LV PW:         1.00 cm LV IVS:        0.80 cm LVOT diam:     2.00 cm LV SV:         42 LV SV Index:   21 LVOT Area:     3.14 cm  LV Volumes (MOD) LV vol d, MOD A4C: 45.6 ml LV vol s, MOD A4C: 21.1 ml LV SV MOD A4C:     45.6 ml RIGHT VENTRICLE          IVC RV Basal diam:  4.30 cm  IVC diam: 2.20 cm RV Mid diam:    3.80 cm  LEFT ATRIUM             Index       RIGHT ATRIUM           Index LA diam:        3.80 cm 1.86 cm/m  RA Area:     25.50 cm LA Vol (A2C):   81.7 ml 40.00 ml/m RA Volume:   77.10 ml  37.74 ml/m LA Vol (A4C):   99.9 ml 48.90 ml/m LA Biplane Vol: 95.4 ml 46.70 ml/m  AORTIC VALVE LVOT Vmax:   83.60 cm/s LVOT Vmean:  54.200 cm/s LVOT VTI:    0.135 m  AORTA Ao Root diam: 2.90 cm TRICUSPID VALVE TR Peak grad:   40.7 mmHg TR Vmax:        319.00 cm/s  SHUNTS Systemic VTI:  0.14 m Systemic Diam: 2.00 cm Candee Furbish MD Electronically signed by Candee Furbish MD Signature Date/Time: 06/15/2019/5:38:19 PM    Final     Microbiology: Recent Results (from the past 240 hour(s))  Respiratory Panel by RT PCR (Flu A&B, Covid) - Nasopharyngeal Swab     Status: None   Collection Time: 06/14/19  8:49 PM   Specimen: Nasopharyngeal Swab  Result Value Ref Range Status   SARS Coronavirus 2 by RT PCR NEGATIVE NEGATIVE Final    Comment: (NOTE) SARS-CoV-2 target nucleic acids are NOT DETECTED. The SARS-CoV-2 RNA is generally detectable in upper respiratoy specimens during the acute phase of infection. The lowest  concentration of SARS-CoV-2 viral copies this assay can detect is 131 copies/mL. A negative result does not preclude SARS-Cov-2 infection and should not be used as the sole basis for treatment or other patient management decisions. A negative result may occur with  improper specimen collection/handling, submission of specimen other than nasopharyngeal swab, presence of viral mutation(s) within the areas targeted by this assay, and inadequate number of viral copies (<131 copies/mL). A negative result must be combined with clinical observations, patient history, and epidemiological information. The expected result is Negative. Fact Sheet for Patients:  PinkCheek.be Fact Sheet for Healthcare Providers:  GravelBags.it This test is not yet ap proved or  cleared by the [place] FDA and  has been authorized for detection and/or diagnosis of SARS-CoV-2 by FDA under an Emergency Use Authorization (EUA). This EUA will remain  in effect (meaning this test can be used) for the duration of the COVID-19 declaration under Section 564(b)(1) of the Act, 21 U.S.C. section 360bbb-3(b)(1), unless the authorization is terminated or revoked sooner.    Influenza A by PCR NEGATIVE NEGATIVE Final   Influenza B by PCR NEGATIVE NEGATIVE Final    Comment: (NOTE) The Xpert Xpress SARS-CoV-2/FLU/RSV assay is intended as an aid in  the diagnosis of influenza from Nasopharyngeal swab specimens and  should not be used as a sole basis for treatment. Nasal washings and  aspirates are unacceptable for Xpert Xpress SARS-CoV-2/FLU/RSV  testing. Fact Sheet for Patients: PinkCheek.be Fact Sheet for Healthcare Providers: GravelBags.it This test is not yet approved or cleared by the [place] FDA and  has been authorized for detection and/or diagnosis of SARS-CoV-2 by  FDA under an Emergency Use Authorization (EUA). This EUA will remain  in effect (meaning this test can be used) for the duration of the  Covid-19 declaration under Section 564(b)(1) of the Act, 21  U.S.C. section 360bbb-3(b)(1), unless the authorization is  terminated or revoked. Performed at [facility], [place] [address] [place], [place] [address]   Surgical PCR screen     Status: None   Collection Time: 06/19/19  4:27 PM   Specimen: Nasal Mucosa; Nasal Swab  Result Value Ref Range Status   MRSA, PCR NEGATIVE NEGATIVE Final   Staphylococcus aureus NEGATIVE NEGATIVE Final    Comment: (NOTE) The Xpert SA Assay (FDA approved for NASAL specimens in patients 66 years of age and older), is one component of a comprehensive surveillance program. It is not intended to diagnose infection nor to guide or monitor treatment. Performed at  [facility], [place] [address] [place], [place] [address]      Labs: Basic Metabolic Panel: Recent Labs  Lab 06/16/19 0555 06/17/19 0437 06/18/19 0645 06/19/19 0358 06/21/19 0455  NA 140 139 140 140 139  K 3.9 4.5 4.5 4.1 4.0  CL 102 104 106 107 107  CO2 29 28 27 26 26   GLUCOSE 68* 67* 64* 78 79  BUN 9 11 13 16 15   CREATININE 1.33* 1.28* 1.48* 1.48* 1.21*  CALCIUM 8.5* 8.3* 8.4* 8.1* 8.2*  MG 1.8 2.2 2.1 2.0  --   PHOS 3.2  --   --   --   --    Liver Function Tests: Recent Labs  Lab 06/16/19 0555  AST 13*  ALT 9  ALKPHOS 70  BILITOT 0.6  PROT 6.0*  ALBUMIN 2.1*   No results for input(s): LIPASE, AMYLASE in the last 168 hours. No results for input(s): AMMONIA in the last 168 hours.  CBC: Recent Labs  Lab 06/14/19 1430 06/15/19 0640 06/16/19 0555 06/17/19 0437 06/21/19 0455  WBC 7.0 6.5 4.3 4.7 5.1  NEUTROABS 5.4  --   --   --  3.2  HGB 9.7* 8.4* 9.5* 9.1* 9.2*  HCT 32.0* 27.4* 30.7* 29.7* 29.9*  MCV 91.2 90.4 90.6 90.8 89.8  PLT 320 276 321 290 220   Cardiac Enzymes: No results for input(s): CKTOTAL, CKMB, CKMBINDEX, TROPONINI in the last 168 hours. BNP: BNP (last 3 results) Recent Labs    11/27/18 1235 12/06/18 1934 06/14/19 1430  BNP 239.3* 182.3* 141.0*    ProBNP (last 3 results) Recent Labs    06/13/19 1429  PROBNP 251.0*    CBG: No results for input(s): GLUCAP in the last 168 hours.     Signed:  Florencia Reasons MD, PhD, FACP  [facility] 06/21/2019, 1:01 PM

## 2019-06-23 ENCOUNTER — Telehealth: Payer: Self-pay | Admitting: Pulmonary Disease

## 2019-06-23 DIAGNOSIS — G4734 Idiopathic sleep related nonobstructive alveolar hypoventilation: Secondary | ICD-10-CM

## 2019-06-23 MED FILL — Heparin Sod (Porcine)-NaCl IV Soln 1000 Unit/500ML-0.9%: INTRAVENOUS | Qty: 500 | Status: AC

## 2019-06-23 MED FILL — Lidocaine HCl Local Inj 1%: INTRAMUSCULAR | Qty: 60 | Status: AC

## 2019-06-23 MED FILL — Midazolam HCl Inj 5 MG/5ML (Base Equivalent): INTRAMUSCULAR | Qty: 5 | Status: AC

## 2019-06-23 NOTE — Telephone Encounter (Signed)
ONO order was placed by Dr. Halford Chessman 05/13/19. APS finally got up with the patient to give her the ONO machine.  The patient kept forgetting to do the ONO and then went in to the hospital. The patient came home from the hospital today and Chrystal with APS went to pick up the Pulse Ox.  She stated the ONO order is now too old and they will need a new ONO order to get the ONO done for the patient.

## 2019-06-23 NOTE — Telephone Encounter (Signed)
New ONO ordered.

## 2019-06-24 DIAGNOSIS — R0609 Other forms of dyspnea: Secondary | ICD-10-CM | POA: Diagnosis not present

## 2019-06-24 DIAGNOSIS — R634 Abnormal weight loss: Secondary | ICD-10-CM | POA: Diagnosis not present

## 2019-06-24 DIAGNOSIS — R1310 Dysphagia, unspecified: Secondary | ICD-10-CM | POA: Diagnosis not present

## 2019-06-24 DIAGNOSIS — E890 Postprocedural hypothyroidism: Secondary | ICD-10-CM | POA: Diagnosis not present

## 2019-06-24 DIAGNOSIS — K5909 Other constipation: Secondary | ICD-10-CM | POA: Diagnosis not present

## 2019-06-24 DIAGNOSIS — Z7689 Persons encountering health services in other specified circumstances: Secondary | ICD-10-CM | POA: Diagnosis not present

## 2019-06-24 NOTE — Addendum Note (Signed)
Addended by: Wonda Horner on: 06/24/2019 03:08 PM   Modules accepted: Orders

## 2019-06-24 NOTE — Addendum Note (Signed)
Addended by: Len Blalock on: 06/24/2019 08:42 AM   Modules accepted: Orders

## 2019-06-25 NOTE — Progress Notes (Signed)
Cardiology Office Note:    Date:  06/26/2019   ID:  Jillian Eaton, DOB 20-Jun-1950, MRN [id number]  PCP:  [name], MD  Cardiologist:  Pixie Casino, MD   Referring MD: [name], MD   Chief Complaint  Patient presents with  . Follow-up  . Shortness of Breath    When walking.  . Tachycardia    History of Present Illness:    Jillian Eaton is a 69 y.o. female with a hx of NICM, HTN, pulmonary hypertension, chronic atrial fibrillation, chronic respiratory failure, fibromyalgia, normal coronaries by heart cath in 2012. She had an unintentional narcotic overdose resulting in aspiration PNA. EF at that time was 25% with subsequent improvement; chronic diastolic heart failure.  She had gastric bypass surgery in 1992, hx of IDA, and CKD stage III. She has failed amiodarone and dofetilide for Afib. She was doing well with rate control until recently. She presented to [facility] with exertional palpitations and worsening DOE. She was rate controlled with rest, but rates increased significantly with movement. Afib first diagnosed in 2019, failed DCCV and amiodarone 08/2017. She has had significant weight loss of 200 lbs. She is anticoagulated with eliquis. DCCV attempted, but failed on 06/18/19 (after 4 attempts). She then underwent tikosyn load. She continued to have RVR with ambulation and ultimately AV node ablation and PPM insertion was pursued on 06/20/19. She tolerated this well and was discharged on 06/21/19. Echo during admission with normal EF, elevated PA pressure of 50.7 mmHg, biatrial enlargement, moderate TR.  She presents today for follow up. She reports that she is still getting SOB and feels heart racing with any movement. Will interrogate her device here to check HR. She had knee surgery March 9. She wants to return to physical therapy and needs clearance. No chest pain or syncope. She is hypotensive, no anti-hypertensives listed to modify. She reports poor PO  intake. She tells me she is hydrated adequately. She drinks 2-3 16-oz bottles of water daily. She is feeling chest soreness/pressure in the center of her chest. She is also feeling fatigue.    Past Medical History:  Diagnosis Date  . Anemia    iron and pernicious  . Arthritis   . Asthma    flare up last yr  . CHF (congestive heart failure) ([place])   . Chronic headache    " better now "   . Complication of anesthesia    BP dropped   . DOE (dyspnea on exertion)    2D ECHO, 02/12/2012 - EF 60-65%, moderate concentric hypertrophy  . Dysrhythmia   . Fibromyalgia    nerve pain"left side at waist level" "can't lay on that side without pain" , "HOB elevation helps"; pt. thinks this has resolved after 200lb weight loss9-17- since i lost the wieght , i dont  think i have this anymore   . Heart murmur   . Hematuria - cause not known    resolved   . History of kidney stones    x 2 '13, '14 surgery to remove  . Hypertension   . Pneumonia    aspiration  04/2016  . SBO (small bowel obstruction) ([place])    rqueired admission 2020  . Thyroid disease    "goiter"  . Transfusion history    10 yrs+    Past Surgical History:  Procedure Laterality Date  . ABDOMINAL ADHESION SURGERY  2004   open LOA - in [place]  . AV NODE ABLATION N/A 06/20/2019  Procedure: AV NODE ABLATION;  Surgeon: Evans Lance, MD;  Location: [place] [facility] [place] [facility];  Service: Cardiovascular;  Laterality: N/A;  . CARDIAC CATHETERIZATION  04/04/2010   No significant obstructive coronary artery disease  . CARDIOVERSION N/A 08/08/2017   Procedure: CARDIOVERSION;  Surgeon: Pixie Casino, MD;  Location: [facility] ENDOSCOPY;  Service: Cardiovascular;  Laterality: N/A;  . CARDIOVERSION N/A 06/18/2019   Procedure: CARDIOVERSION;  Surgeon: Pixie Casino, MD;  Location: [facility];  Service: Cardiovascular;  Laterality: N/A;  . CHOLECYSTECTOMY  1990  . COLONOSCOPY W/ POLYPECTOMY    . COLONOSCOPY WITH PROPOFOL N/A 04/10/2014   Procedure:  COLONOSCOPY WITH PROPOFOL;  Surgeon: Beryle Beams, MD;  Location: [facility] ENDOSCOPY;  Service: Endoscopy;  Laterality: N/A;  . EYE SURGERY     lasik 20-25 yrs ago  . GASTROPLASTY VERTICAL BANDED  1985  . [place]   In [place] for SBO  . PACEMAKER IMPLANT N/A 06/20/2019   Procedure: PACEMAKER IMPLANT;  Surgeon: Evans Lance, MD;  Location: [place] [facility];  Service: Cardiovascular;  Laterality: N/A;  . REVERSE SHOULDER ARTHROPLASTY Right 03/29/2018   Procedure: REVERSE SHOULDER ARTHROPLASTY;  Surgeon: Netta Cedars, MD;  Location: [facility];  Service: Orthopedics;  Laterality: Right;  . ROUX-EN-Y GASTRIC BYPASS  1992   Conversion VBG to RnYGB in [place], [place]  . SHOULDER ARTHROSCOPY WITH SUBACROMIAL DECOMPRESSION  2016   Dr Berenice Primas, [place], [place]  . SMALL INTESTINE SURGERY  2000   SBO - LOA w SB resection  . TOTAL KNEE ARTHROPLASTY Left 11/14/2018   Procedure: TOTAL KNEE ARTHROPLASTY;  Surgeon: Paralee Cancel, MD;  Location: [facility];  Service: Orthopedics;  Laterality: Left;  70 mins  . TOTAL KNEE ARTHROPLASTY Right 04/29/2019   Procedure: TOTAL KNEE ARTHROPLASTY;  Surgeon: Paralee Cancel, MD;  Location: [facility];  Service: Orthopedics;  Laterality: Right;  70 mins  . TOTAL THYROIDECTOMY  06/26/2012   Dr Celine Ahr, [facility]  . TUBAL LIGATION  1986  . URETHRAL DILATION  2012   w cystoscopy.  Dr Gaynelle Arabian  . UTERINE FIBROID EMBOLIZATION      Current Medications: Current Meds  Medication Sig  . acetaminophen (TYLENOL) 500 MG tablet Take 2 tablets (1,000 mg total) by mouth every 8 (eight) hours. (Patient taking differently: Take 1,000 mg by mouth daily. )  . apixaban (ELIQUIS) 5 MG TABS tablet Take 1 tablet (5 mg total) by mouth 2 (two) times daily. Please resume eliquis on 5/5 pm,  . calcitRIOL (ROCALTROL) 0.5 MCG capsule Take 0.5 mcg by mouth 2 (two) times daily.   . clotrimazole (LOTRIMIN) 1 % cream APPLY TO AFFECTED AREA TWICE A DAY (Patient taking  differently: Apply 1 application topically daily as needed (itching). )  . cyanocobalamin (,VITAMIN B-12,) 1000 MCG/ML injection Inject 1 mL (1,000 mcg total) into the muscle every 30 (thirty) days.  . cyclobenzaprine (FLEXERIL) 10 MG tablet Take 2 tablets (20 mg total) by mouth 2 (two) times daily. (Patient taking differently: Take 20 mg by mouth at bedtime. )  . DULoxetine (CYMBALTA) 60 MG capsule Take 1 capsule (60 mg total) by mouth daily.  . eszopiclone (LUNESTA) 2 MG TABS tablet Take 1 tablet (2 mg total) by mouth at bedtime.  . fluticasone furoate-vilanterol (BREO ELLIPTA) 100-25 MCG/INH AEPB Inhale 1 puff into the lungs daily.  . furosemide (LASIX) 20 MG tablet Take 1 tablet (20 mg total) by mouth as needed. (Patient taking differently: Take 20 mg by mouth  daily as needed for fluid or edema. )  . levETIRAcetam (KEPPRA) 500 MG tablet Take 1 tablet (500 mg total) by mouth 2 (two) times daily.  Marland Kitchen levothyroxine (SYNTHROID) 150 MCG tablet TAKE 1 TABLET BY MOUTH EVERY DAY (Patient taking differently: Take 150 mcg by mouth daily before breakfast. )  . magnesium oxide (MAG-OX) 400 (241.3 Mg) MG tablet Take 400 mg by mouth daily.   . NONFORMULARY OR COMPOUNDED ITEM [facility]:  Antifungal Cream - Terbinafine 3%, Fluconazole 2%, Tea Tree Oil 5%, Urea 10%, Ibuprofen 2% in DMSO Suspension #18ml. Apply to affected toenail(s) once daily (at bedtime) or twice daily. (Patient taking differently: Apply 1 application topically See admin instructions. [facility]:  Antifungal Cream - Terbinafine 3%, Fluconazole 2%, Tea Tree Oil 5%, Urea 10%, Ibuprofen 2% in DMSO Suspension #70ml. Apply to affected toenail(s) once daily as needed for fungus.)  . traZODone (DESYREL) 50 MG tablet Take 1 tablet (50 mg total) by mouth at bedtime.  . Vitamin D, Ergocalciferol, (DRISDOL) 1.25 MG (50000 UT) CAPS capsule Take 1 capsule (50,000 Units total) by mouth every Wednesday.     Allergies:   Hydrocodone   Social  History   Socioeconomic History  . Marital status: Married    Spouse name: Not on file  . Number of children: 3  . Years of education: Not on file  . Highest education level: Master's degree (e.g., MA, MS, MEng, MEd, MSW, MBA)  Occupational History  . Occupation: retired  Tobacco Use  . Smoking status: Never Smoker  . Smokeless tobacco: Never Used  Substance and Sexual Activity  . Alcohol use: No    Comment: wine occ  . Drug use: No    Types: Oxycodone    Comment: perscribed  . Sexual activity: Not Currently    Birth control/protection: None  Other Topics Concern  . Not on file  Social History Narrative   Right handed   1 cup of caffeine per day        Social Determinants of Health   Financial Resource Strain:   . Difficulty of Paying Living Expenses:   Food Insecurity:   . Worried About Charity fundraiser in the Last Year:   . Arboriculturist in the Last Year:   Transportation Needs:   . Film/video editor (Medical):   Marland Kitchen Lack of Transportation (Non-Medical):   Physical Activity:   . Days of Exercise per Week:   . Minutes of Exercise per Session:   Stress:   . Feeling of Stress :   Social Connections:   . Frequency of Communication with Friends and Family:   . Frequency of Social Gatherings with Friends and Family:   . Attends Religious Services:   . Active Member of Clubs or Organizations:   . Attends Archivist Meetings:   Marland Kitchen Marital Status:      Family History: The patient's family history includes Asthma in her father; Breast cancer in her maternal grandmother, mother, and paternal grandmother; Cancer in her maternal grandmother, mother, and paternal grandmother; Diabetes in her father and mother; Epilepsy in her mother and sister; Heart disease in her father; Hypertension in her father and mother; Kidney disease in her father; Seizures in her mother.  ROS:   Please see the history of present illness.     All other systems reviewed and are  negative.  EKGs/Labs/Other Studies Reviewed:    The following studies were reviewed today:  Echo 06/15/19: 1. Left ventricular  ejection fraction, by estimation, is 60 to 65%. The  left ventricle has normal function. The left ventricle has no regional  wall motion abnormalities. Left ventricular diastolic parameters are  indeterminate.  2. Right ventricular systolic function is normal. The right ventricular  size is mildly enlarged. There is moderately elevated pulmonary artery  systolic pressure. The estimated right ventricular systolic pressure is  [id number] mmHg.  3. Left atrial size was moderately dilated.  4. Right atrial size was moderately dilated.  5. The mitral valve is normal in structure. Mild mitral valve  regurgitation. No evidence of mitral stenosis.  6. Tricuspid valve regurgitation is moderate.  7. The aortic valve is tricuspid. Aortic valve regurgitation is trivial.  Mild aortic valve sclerosis is present, with no evidence of aortic valve  stenosis.  8. The inferior vena cava is normal in size with greater than 50%  respiratory variability, suggesting right atrial pressure of 3 mmHg.   EKG:  EKG is  ordered today.  The ekg ordered today demonstrates V paced rhythm rate 81  Recent Labs: 06/13/2019: Pro B Natriuretic peptide (BNP) 251.0; TSH 7.71 06/14/2019: B Natriuretic Peptide 141.0 06/16/2019: ALT 9 06/19/2019: Magnesium 2.0 06/21/2019: BUN 15; Creatinine, Ser 1.21; Hemoglobin 9.2; Platelets 220; Potassium 4.0; Sodium 139  Recent Lipid Panel    Component Value Date/Time   CHOL 150 05/27/2018 0543   TRIG 50 05/27/2018 0543   HDL 62 05/27/2018 0543   CHOLHDL 2.4 05/27/2018 0543   VLDL 10 05/27/2018 0543   LDLCALC 78 05/27/2018 0543    Physical Exam:    VS:  BP 90/68 (BP Location: Right Arm, Patient Position: Sitting, Cuff Size: Large)   Pulse 81   Ht 5\' 7"  (1.702 m)   Wt 202 lb (91.6 kg)   BMI 31.64 kg/m     Wt Readings from Last 3 Encounters:    06/26/19 202 lb (91.6 kg)  06/21/19 194 lb 4.8 oz (88.1 kg)  06/13/19 204 lb 12.8 oz (92.9 kg)     GEN:  Well nourished, well developed in no acute distress HEENT: Normal NECK: No JVD; No carotid bruits LYMPHATICS: No lymphadenopathy CARDIAC: RRR, no murmurs, rubs, gallops RESPIRATORY:  Clear to auscultation without rales, wheezing or rhonchi  ABDOMEN: Soft, non-tender, non-distended MUSCULOSKELETAL:  No edema; No deformity  SKIN: Warm and dry NEUROLOGIC:  Alert and oriented x 3 PSYCHIATRIC:  Normal affect  PPM pocket with steri strips, no oozing Right groin site C/D/I   ASSESSMENT:    1. Chronic atrial fibrillation ([place])   2. S/P AV nodal ablation   3. Pacemaker   4. Atypical chest pain   5. Pulmonary HTN ([place])   6. Morbid obesity due to excess calories (H[facility])   7. Stage 3 chronic kidney disease, unspecified whether stage 3a or 3b CKD   8. Hypotension, unspecified hypotension type    PLAN:    In order of problems listed above:  Atrial fibrillation with RVR s/p AV node ablation with Single chamber PPM insertion - continue eliquis - PPM pocket with steri strips, no oozing - she reported rapid heart rate and SOB with movement - interrogation shows no episodes of tachycardia or Afib - I suspect that deconditioning and hyper-awareness of her heart is contributing to these effects - keep follow with EP next week   Chest pain - not exertional - do not suspect ACS - discussed chest pain following   Hypotension - on no anti-hypertensives - discussed midodrine, but I think she should focus  on better PO intake and general recovery for now   Pulmonary hypertension - follows with Dr. Halford Chessman - if she continues to have SOB with movement, recommend seeing him back - suspect this is deconditioning   CKD stage III - sCr at discharge 1.21   Obesity S/p gastric bypass surgery - has had a 200 lbs weight loss - poor PO intake - I encouraged hydration and lean protein to  help her recover   Follow up in 1 month.   Medication Adjustments/Labs and Tests Ordered: Current medicines are reviewed at length with the patient today.  Concerns regarding medicines are outlined above.  Orders Placed This Encounter  Procedures  . EKG 12-Lead   No orders of the defined types were placed in this encounter.   Signed, Ledora Bottcher, PA  06/26/2019 12:08 PM    [facility]

## 2019-06-26 ENCOUNTER — Encounter: Payer: Self-pay | Admitting: Physician Assistant

## 2019-06-26 ENCOUNTER — Ambulatory Visit (INDEPENDENT_AMBULATORY_CARE_PROVIDER_SITE_OTHER): Payer: Medicare Other | Admitting: Physician Assistant

## 2019-06-26 ENCOUNTER — Ambulatory Visit (HOSPITAL_COMMUNITY): Payer: Medicare Other | Admitting: Physician Assistant

## 2019-06-26 ENCOUNTER — Other Ambulatory Visit: Payer: Self-pay

## 2019-06-26 VITALS — BP 90/68 | HR 81 | Ht 67.0 in | Wt 202.0 lb

## 2019-06-26 DIAGNOSIS — I959 Hypotension, unspecified: Secondary | ICD-10-CM | POA: Diagnosis not present

## 2019-06-26 DIAGNOSIS — R0789 Other chest pain: Secondary | ICD-10-CM

## 2019-06-26 DIAGNOSIS — N183 Chronic kidney disease, stage 3 unspecified: Secondary | ICD-10-CM | POA: Diagnosis not present

## 2019-06-26 DIAGNOSIS — I482 Chronic atrial fibrillation, unspecified: Secondary | ICD-10-CM

## 2019-06-26 DIAGNOSIS — Z950 Presence of cardiac pacemaker: Secondary | ICD-10-CM

## 2019-06-26 DIAGNOSIS — I272 Pulmonary hypertension, unspecified: Secondary | ICD-10-CM | POA: Diagnosis not present

## 2019-06-26 DIAGNOSIS — Z9889 Other specified postprocedural states: Secondary | ICD-10-CM | POA: Diagnosis not present

## 2019-06-26 NOTE — Patient Instructions (Signed)
Medication Instructions:  Your physician recommends that you continue on your current medications as directed. Please refer to the Current Medication list given to you today.  *If you need a refill on your cardiac medications before your next appointment, please call your pharmacy*   Follow-Up: At [facility], you and your health needs are our priority.  As part of our continuing mission to provide you with exceptional heart care, we have created designated Provider Care Teams.  These Care Teams include your primary Cardiologist (physician) and Advanced Practice Providers (APPs -  Physician Assistants and Nurse Practitioners) who all work together to provide you with the care you need, when you need it.  We recommend signing up for the patient portal called "MyChart".  Sign up information is provided on this After Visit Summary.  MyChart is used to connect with patients for Virtual Visits (Telemedicine).  Patients are able to view lab/test results, encounter notes, upcoming appointments, etc.  Non-urgent messages can be sent to your provider as well.   To learn more about what you can do with MyChart, go to [email or web address].    Your next appointment:   1 month(s)  The format for your next appointment:   In Person  Provider:   K. Mali Hilty, MD or Fabian Sharp, PA-C   Other Instructions We have faxed your letter to return to PT on Monday to [facility].

## 2019-06-30 ENCOUNTER — Telehealth: Payer: Self-pay | Admitting: Pulmonary Disease

## 2019-06-30 DIAGNOSIS — I499 Cardiac arrhythmia, unspecified: Secondary | ICD-10-CM | POA: Diagnosis not present

## 2019-06-30 DIAGNOSIS — R1310 Dysphagia, unspecified: Secondary | ICD-10-CM | POA: Diagnosis not present

## 2019-06-30 DIAGNOSIS — R630 Anorexia: Secondary | ICD-10-CM | POA: Diagnosis not present

## 2019-06-30 MED ORDER — ARNUITY ELLIPTA 100 MCG/ACT IN AEPB: 1.0000 | INHALATION_SPRAY | Freq: Every day | RESPIRATORY_TRACT | 2 refills | Status: DC

## 2019-06-30 NOTE — Telephone Encounter (Signed)
Okay to send script for arnuity 100, one puff daily and d/c breo.  She should call back if her breathing doesn't improve.

## 2019-06-30 NOTE — Telephone Encounter (Signed)
Called and spoke with patient letting her know that we will send in prescription for Arnuity to pharmacy. Asked patient to call the office back if her breathing doesn't improve. Patient expressed understanding. Nothing further needed at this time.

## 2019-06-30 NOTE — Telephone Encounter (Signed)
Patient was admitted to hospital on 4/24 and had a pacemaker placed about a week ago. She is still having shortness of breath with exertion. States its not as bad as it was before going to the hospital but it's still there. Patient feels Memory Dance not helping she feels like Arnuity did a better job.   Dr. Halford Chessman please advise

## 2019-07-01 ENCOUNTER — Telehealth: Payer: Self-pay

## 2019-07-01 ENCOUNTER — Other Ambulatory Visit: Payer: Self-pay | Admitting: Gastroenterology

## 2019-07-01 NOTE — Telephone Encounter (Signed)
   Primary Cardiologist: Pixie Casino, MD  Chart reviewed as part of pre-operative protocol coverage. Patient recently seen by Angie just a few days ago with symptoms as outlined. Will route to Angie for input on whether she feels patient would be medically cleared for EGD, and also to pharm for input on anticoagulation.  Charlie Pitter, PA-C 07/01/2019, 2:52 PM

## 2019-07-01 NOTE — Telephone Encounter (Signed)
   [facility] Pre-operative Risk Assessment    Request for surgical clearance:  1. What type of surgery is being performed? EGD  2. When is this surgery scheduled? 07/25/19  3. What type of clearance is required (medical clearance vs. Pharmacy clearance to hold med vs. Both)? both  4. Are there any medications that need to be held prior to surgery and how long? eliquis   5. Practice name and name of physician performing surgery? Sales promotion account executive PA   6. What is your office phone number [phone]   7.   What is your office fax number [phone]  8.   Anesthesia type (None, local, MAC, general) ? propofol   Jillian Eaton 07/01/2019, 11:08 AM  _________________________________________________________________   (provider comments below)

## 2019-07-02 DIAGNOSIS — M25561 Pain in right knee: Secondary | ICD-10-CM | POA: Diagnosis not present

## 2019-07-02 NOTE — Telephone Encounter (Signed)
Patient with diagnosis of afib on Eliquis for anticoagulation.    Procedure:  EGD Date of procedure: 07/25/19  CHADS2-VASc score of  4 (CHF, HTN, AGE, female)  CrCl 52 ml/min  Per office protocol, patient can hold Eliquis for 1-2 days prior to procedure.

## 2019-07-03 ENCOUNTER — Other Ambulatory Visit: Payer: Self-pay

## 2019-07-03 ENCOUNTER — Encounter: Payer: Self-pay | Admitting: Pulmonary Disease

## 2019-07-03 ENCOUNTER — Ambulatory Visit (INDEPENDENT_AMBULATORY_CARE_PROVIDER_SITE_OTHER): Payer: Medicare Other | Admitting: Emergency Medicine

## 2019-07-03 DIAGNOSIS — I97190 Other postprocedural cardiac functional disturbances following cardiac surgery: Secondary | ICD-10-CM | POA: Diagnosis not present

## 2019-07-03 DIAGNOSIS — I482 Chronic atrial fibrillation, unspecified: Secondary | ICD-10-CM | POA: Diagnosis not present

## 2019-07-03 DIAGNOSIS — I442 Atrioventricular block, complete: Secondary | ICD-10-CM

## 2019-07-03 DIAGNOSIS — Z950 Presence of cardiac pacemaker: Secondary | ICD-10-CM

## 2019-07-03 LAB — CUP PACEART INCLINIC DEVICE CHECK
Battery Remaining Longevity: 139 mo
Battery Voltage: 3.21 V
Brady Statistic AP VP Percent: 0 %
Brady Statistic AP VS Percent: 0 %
Brady Statistic AS VP Percent: 99.85 %
Brady Statistic AS VS Percent: 0.15 %
Brady Statistic RA Percent Paced: 0 %
Brady Statistic RV Percent Paced: 99.85 %
Date Time Interrogation Session: 20210513112123
Implantable Lead Implant Date: 20210430
Implantable Lead Location: 753860
Implantable Lead Model: 3830
Implantable Pulse Generator Implant Date: 20210430
Lead Channel Impedance Value: 3382 Ohm
Lead Channel Impedance Value: 3382 Ohm
Lead Channel Impedance Value: 361 Ohm
Lead Channel Impedance Value: 532 Ohm
Lead Channel Pacing Threshold Amplitude: 0.5 V
Lead Channel Pacing Threshold Pulse Width: 0.4 ms
Lead Channel Setting Pacing Amplitude: 3.5 V
Lead Channel Setting Pacing Pulse Width: 0.4 ms
Lead Channel Setting Sensing Sensitivity: 4 mV

## 2019-07-03 NOTE — Telephone Encounter (Signed)
I attempted to call the patient 07/02/19 - bad cell phone connection, on her way to the cell phone store - and again today 07/03/19 and left a message.  Will continue to try to reach the patient.

## 2019-07-03 NOTE — Progress Notes (Signed)
Wound check appointment. Steri-strips removed. Wound without redness or edema. Incision edges approximated, wound well healed. Normal device function. Thresholds, sensing, and impedances consistent with implant measurements. Device programmed at 3.5V with auto capture on for extra safety margin until 3 month visit. Histogram distribution consistent with programming. No high ventricular rates noted. Base rate reduced to 70bpm per protocol (presented VVIR 80bpm). Patient educated about wound care, arm mobility, lifting restrictions, and Carelink app. ROV with EP APP on 07/22/19 and Carelink on 09/19/19.

## 2019-07-04 ENCOUNTER — Other Ambulatory Visit: Payer: Self-pay

## 2019-07-04 NOTE — Telephone Encounter (Signed)
Patient called back returning a call from the pre-op team

## 2019-07-07 ENCOUNTER — Telehealth: Payer: Self-pay

## 2019-07-07 DIAGNOSIS — M25561 Pain in right knee: Secondary | ICD-10-CM | POA: Diagnosis not present

## 2019-07-07 NOTE — Telephone Encounter (Signed)
Patient has been submitted to the [facility] Portal to get summary of benefits.

## 2019-07-08 ENCOUNTER — Ambulatory Visit (INDEPENDENT_AMBULATORY_CARE_PROVIDER_SITE_OTHER): Payer: Medicare Other | Admitting: Internal Medicine

## 2019-07-08 ENCOUNTER — Telehealth: Payer: Self-pay | Admitting: Pulmonary Disease

## 2019-07-08 ENCOUNTER — Encounter: Payer: Self-pay | Admitting: Internal Medicine

## 2019-07-08 ENCOUNTER — Other Ambulatory Visit: Payer: Self-pay

## 2019-07-08 VITALS — BP 120/80 | HR 88 | Ht 67.0 in | Wt 199.0 lb

## 2019-07-08 DIAGNOSIS — E890 Postprocedural hypothyroidism: Secondary | ICD-10-CM | POA: Diagnosis not present

## 2019-07-08 DIAGNOSIS — C73 Malignant neoplasm of thyroid gland: Secondary | ICD-10-CM

## 2019-07-08 DIAGNOSIS — G4736 Sleep related hypoventilation in conditions classified elsewhere: Secondary | ICD-10-CM | POA: Diagnosis not present

## 2019-07-08 DIAGNOSIS — E559 Vitamin D deficiency, unspecified: Secondary | ICD-10-CM | POA: Diagnosis not present

## 2019-07-08 DIAGNOSIS — M810 Age-related osteoporosis without current pathological fracture: Secondary | ICD-10-CM | POA: Diagnosis not present

## 2019-07-08 NOTE — Telephone Encounter (Signed)
I tried calling the pt and there was no answer- LMTCB. 

## 2019-07-08 NOTE — Telephone Encounter (Signed)
Last office note indicates patient is having chest soreness/pressure in the center of her chest but it was not exertional. Also having shortness of breath with activity. I spoke with patient who reports she is now having chest tightness with exertion at times. She states it is mostly with exertion but not always.  Occurs when walking down a hallway or walking at Physical Therapy. Also has shortness of breath. She takes a break and tightness gets better.  Did have one episode of chest tightness last night while lying in bed. Chest tightness is in the center of her chest and not near pacer insertion site.  She is scheduled for endoscopy on June 4,2021 due to difficulty swallowing, weight loss and belching.  Will forward to Dr [name] for review/recommendations.

## 2019-07-08 NOTE — Telephone Encounter (Signed)
Will need ov to sort out , pt of Dr. Halford Chessman  Can offer visit with Dr. Halford Chessman  Or APP this week -would think in person would be best . If not virtual   Please contact office for sooner follow up if symptoms do not improve or worsen or seek emergency care

## 2019-07-08 NOTE — Patient Instructions (Addendum)
Please continue: - Vitamin D 50,000 U weekly - Calcitriol 0.5 mcg 2x a day  Also, continue levothyroxine 150 mcg daily.  Take the thyroid hormone every day, with water, at least 30 minutes before breakfast, separated by at least 4 hours from: - acid reflux medications - calcium - iron - multivitamins  Please come back for a follow-up appointment in 6 months.

## 2019-07-08 NOTE — Telephone Encounter (Signed)
Called and spoke with pt letting her know that we needed to get her scheduled for an appt and she verbalized understanding. Pt has been scheduled for an appt tomorrow 5/19 at 11am with Dr. Halford Chessman. Nothing further needed.

## 2019-07-08 NOTE — Telephone Encounter (Signed)
Called and spoke with patient.  Patient does not think breo is working well  But the arnuity worked better. Patient had a pacemaker placed on 4/30 just still feeling short of breath.   Last ov with TP  Assessment and Plan:  Dyspnea questionable etiology recent emergency room visit showed a unremarkable chest x-ray.  She has no associated symptoms for asthma flare such as wheezing and cough. Possibly secondary to her A. fib and possible episodes of RVR Will check labs including TSH and a BNP. Have asked her to limit her pain medications if possible.  Will refer to cardiology for further evaluation of her A. Fib. O2 saturations are adequate at 98% have a low suspicion for pulmonary embolism as she has no leg swelling calf pain.  And is on daily Eliquis with endorsed compliance.  Chronic combined congestive heart failure.  She says her weight is stable and no increased leg swelling.  Have asked her to continue on a low-salt diet her diuretics.  We will check a BNP and be met however she has chronic kidney disease so we will need to diurese cautiously if needed  Leukocytosis questionable etiology.  Patient's white blood cell count is quite elevated from her baseline.  She has no obvious signs of infection or symptoms.  We will check a urine in case she has a underlying urinary tract infection that could be contributing.  She will need close follow-up if not improving will need to be seen in person this week in the office and or seek emergency room care  Tammy please advise.

## 2019-07-08 NOTE — Progress Notes (Signed)
Patient ID: Jillian Eaton, female   DOB: Jun 27, 1950, 69 y.o.   MRN: [id number]   This visit occurred during the SARS-CoV-2 public health emergency.  Safety protocols were in place, including screening questions prior to the visit, additional usage of staff PPE, and extensive cleaning of exam room while observing appropriate contact time as indicated for disinfecting solutions.   HPI  Jillian Eaton is a 69 y.o.-year-old female, initially referred by her PCP, Dr. Sabas Sous, returning for h/o thyroid microcarcinoma,  postsurgical hypothyroidism, postsurgical hypocalcemia, osteoporosis. Last visit 6 months ago.  She was admitted 05/2019 with Afib with RVR  >> now has a pacemaker.  She still feels short of breath and has fatigue.  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 Fredirick Maudlin at [facility].  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  She developed postsurgical hypothyroidism and, unfortunately, also postsurgical hypocalcemia, both uncontrolled.  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 Benign hyperplastic thyroid tissue. See comment. COMMENT: The papillary carcinoma appears to be incidental.  On 06/19/2017 she developed atrial fibrillation.  At that time, her TSH is very suppressed, at 0.07.  She was previously on 300 mcg levothyroxine daily, then decrease to 200 mcg daily, in 12/2018, at 175 mcg, and then to 150 daily in 03/2019: - in am - fasting - at least 30 min from b'fast - no Fe, MVI, PPIs - no calcium - not on Biotin  Reviewed her TFTs: Lab Results  Component Value Date   TSH 7.71 (H) 06/13/2019   TSH 0.05 (L) 01/06/2019   TSH 1.21 11/27/2017    TSH 0.034 (L) 09/21/2017   TSH 1.48 08/03/2017   TSH 0.007 (L) 06/19/2017   TSH 0.046 (L) 08/29/2016   TSH 9.239 (H) 04/25/2016   TSH 68.907 (H) 03/10/2016   FREET4 1.47 01/06/2019   FREET4 1.41 11/27/2017   FREET4 2.87 (H) 09/21/2017   FREET4 1.04 08/03/2017   FREET4 2.43 (H) 06/19/2017   FREET4 1.04 08/29/2016   T3FREE 1.0 (L) 06/14/2019  05/08/2018: TSH 2.87  Pt denies: - feeling nodules in neck - hoarseness - choking - SOB with lying down But she has chronic dysphagia  No FH of thyroid cancer. No h/o radiation tx to head or neck.  No herbal supplements. No Biotin use. No recent steroids use.    Hypocalcemia:  She developed iatrogenic hypocalcemia after her thyroid surgery.  Her PTH was inappropriately normal, possibly related to her CKD.  She was admitted with hypocalcemia and acute respiratory failure on 07/10/2017.  She again had hypocalcemia at her recent admission in 04/2019 along with SOB, fatigue.  Reviewed pertinent labs: Lab Results  Component Value Date   PTH 40 11/27/2017   PTH 44 08/03/2017   PTH 65 07/10/2017   PTH Comment 07/10/2017   CALCIUM 8.2 (L) 06/21/2019   CALCIUM 8.1 (L) 06/19/2019   CALCIUM 8.4 (L) 06/18/2019   CALCIUM 8.3 (L) 06/17/2019   CALCIUM 8.5 (L) 06/16/2019   CALCIUM 8.1 (L) 06/15/2019   CALCIUM 8.3 (L) 06/14/2019   CALCIUM 8.3 (L) 06/13/2019   CALCIUM 8.3 (L) 06/05/2019   CALCIUM 8.5 (L) 05/15/2019  10/09/2017: Ca 8.1 (8.7-10.3)  She has a history of low magnesium and high phosphorus.  Magnesium was normal at last check: Lab Results  Component Value Date   MG 2.0 06/19/2019   MG 2.1 06/18/2019   MG 2.2 06/17/2019   MG 1.8 06/16/2019   MG 1.7 06/05/2019   MG 1.4 (L) 12/07/2018   MG 1.6 11/27/2017   MG 1.5 08/03/2017   MG 2.1 07/15/2017   MG 1.6 (L) 07/14/2017   MG 1.3 (L) 07/13/2017   MG 1.2 (L) 07/10/2017   MG 1.8 07/11/2016   MG 2.2 07/09/2016   MG 1.6 (L) 07/09/2016   MG 1.9 07/08/2016   MG 2.0 07/08/2016    MG 1.6 (L) 07/08/2016   MG 1.6 (L) 05/03/2016   MG 1.8 04/25/2016  10/09/2017: Mg 2.0 (1.6-2.3)  Lab Results  Component Value Date   PHOS 3.2 06/16/2019   PHOS 3.8 11/27/2017   PHOS 6.3 (H) 08/03/2017   PHOS 7.4 (H) 07/10/2017   PHOS 3.6 07/11/2016   PHOS 4.0 07/09/2016   PHOS 3.5 07/09/2016   PHOS 5.2 (H) 07/08/2016   PHOS 6.6 (H) 07/08/2016   PHOS 8.0 (H) 07/08/2016   PHOS 3.6 04/25/2016   PHOS 3.5 04/25/2016   PHOS 3.3 04/24/2016   PHOS 3.7 03/12/2016   PHOS 6.2 (H) 03/10/2016  10/09/2017: phos 4.5 (2.5-4.5)  05/08/2018: -CMP: Glucose 29 (!),  BUN/creatinine 19/0.94, GFR 74, calcium 8.3 (8.7-10.3), Albumin 3.6 (3.8-4.8) -Vitamin D 39.8 -TSH 2.87 -Phosphorus 3.6 (3-4.3) -Magnesium 1.6 (1.6-2.3)  -Ferritin 29 (15-150), hemoglobin 11.1 (11.1-15.9) Corrected calcium: 8.62, slightly lower than normal, which is our target. I was surprised by her very low glucose (possibly lab artifact as she did not have any symptoms at the time of the blood draw).  She does not have a history of diabetes but has steroid-induced hyperglycemia...   She is on the following regimen: -  >> she stopped taking it in 08/2018 (!!) >> restarted 12/2018 - high-dose vitamin D 50,000 units weekly - calcitriol 0.5 mcg 2x a day (she did not decrease to 0.25 mcg 2x a day as advised at last visit, and she forgot)  We also started HCTZ 25 mg daily.  In the meantime, we reduced her Lasix to 50% of her previous dose.  Now taking it as needed.  No hand cramping or perioral numbness.  History of vitamin D deficiency.    Vitamin D level was reviewed and this was actually high at last check while in the hospital: Lab Results  Component Value Date   VD25OH 107.02 (H) 06/16/2019   VD25OH 63.90 01/06/2019   VD25OH 45.84 11/27/2017   VD25OH 43.9 07/14/2017  05/08/2018: Vitamin D 39.8  Calcitriol level was not elevated Component     Latest Ref Rng & Units 11/27/2017  Vitamin D 1, 25 (OH) Total     18 - 72  pg/mL 54  Vitamin D3 1, 25 (OH)     pg/mL 30  Vitamin D2 1, 25 (OH)     pg/mL 24   Osteoporosis:  Reviewed previous DEXA scan report: DXA  - [facility] (12/10/2017) Lumbar spine L1-L4 (L2) Femoral neck (FN) 33% distal left radius Ultra distal left radius  T-score -1.6 RFN: N/a LFN: -2.8  -2.3  -3.5   Assessment: Patient has OSTEOPOROSIS according to the [facility] classification for osteoporosis (see below). Fracture risk: high Comments: the technical quality of the study is good, however, the right femoral neck could not be analyzed as the patient could not internally rotate the right hip.  The left radius was analyzed instead.  Also, L2 vertebra had to be excluded from analysis  due to degenerative changes.   We started Prolia: 01/03/2018 07/16/2018 01/21/2019  She tolerates this well.  No jaw, hip, thigh pain.  + CKD. Last BUN/Cr: Lab Results  Component Value Date   BUN 15 06/21/2019   CREATININE 1.21 (H) 06/21/2019   She surgery for torn rotator cuffs in both shoulders. She has atrial fibrillation and is status post cardioversion. She was on amiodarone before, now off. She was admitted 07/2018 with SBO and 11/2018 with acute on chronic respiratory failure and pulmonary hypertension. She had right TKR 02/18/2019.  She had left TKR 11/14/2018. She ahad a seizure 05/2018.  She was suspected for stroke, but this was ruled out. Of note, upon questioning, she stopped Lyrica 1 week prior to the seizure.  She was taking this for fibromyalgia.  She was started on Keppra.  ROS: Constitutional: no weight gain/no weight loss, + fatigue, no subjective hyperthermia, no subjective hypothermia Eyes: no blurry vision, no xerophthalmia ENT: no sore throat, + see HPI Cardiovascular: no CP/+ SOB/+ palpitations/no leg swelling Respiratory: no cough/+ SOB/no wheezing Gastrointestinal: no N/no V/no D/no C/no acid reflux Musculoskeletal: no muscle aches/no joint aches Skin: no rashes, no hair  loss Neurological: no tremors/no numbness/no tingling/no dizziness  I reviewed pt's medications, allergies, PMH, social hx, family hx, and changes were documented in the history of present illness. Otherwise, unchanged from my initial visit note.  Past Medical History:  Diagnosis Date  . Anemia    iron and pernicious  . Arthritis   . Asthma    flare up last yr  . CHF (congestive heart failure) ([place])   . Chronic headache    " better now "   . Complication of anesthesia    BP dropped   . DOE (dyspnea on exertion)    2D ECHO, 02/12/2012 - EF 60-65%, moderate concentric hypertrophy  . Dysrhythmia   . Fibromyalgia    nerve pain"left side at waist level" "can't lay on that side without pain" , "HOB elevation helps"; pt. thinks this has resolved after 200lb weight loss9-17- since i lost the wieght , i dont  think i have this anymore   . Heart murmur   . Hematuria - cause not known    resolved   . History of kidney stones    x 2 '13, '14 surgery to remove  . Hypertension   . Pneumonia    aspiration  04/2016  . SBO (small bowel obstruction) ([place])    rqueired admission 2020  . Thyroid disease    "goiter"  . Transfusion history    10 yrs+   Past Surgical History:  Procedure Laterality Date  . ABDOMINAL ADHESION SURGERY  2004   open LOA - in [place]  . AV NODE ABLATION N/A 06/20/2019   Procedure: AV NODE ABLATION;  Surgeon: Evans Lance, MD;  Location: [place] [facility];  Service: Cardiovascular;  Laterality: N/A;  . CARDIAC CATHETERIZATION  04/04/2010   No significant obstructive coronary artery disease  . CARDIOVERSION N/A 08/08/2017   Procedure: CARDIOVERSION;  Surgeon: Pixie Casino, MD;  Location: [facility] ENDOSCOPY;  Service: Cardiovascular;  Laterality: N/A;  . CARDIOVERSION N/A 06/18/2019   Procedure: CARDIOVERSION;  Surgeon: Pixie Casino, MD;  Location: [place];  Service: Cardiovascular;  Laterality: N/A;  . CHOLECYSTECTOMY  1990  . COLONOSCOPY W/ POLYPECTOMY    .  COLONOSCOPY WITH PROPOFOL N/A 04/10/2014   Procedure: COLONOSCOPY WITH PROPOFOL;  Surgeon: Beryle Beams, MD;  Location: [facility] ENDOSCOPY;  Service:  Endoscopy;  Laterality: N/A;  . EYE SURGERY     lasik 20-25 yrs ago  . GASTROPLASTY VERTICAL BANDED  1985  . [place]   In [place] for SBO  . PACEMAKER IMPLANT N/A 06/20/2019   Procedure: PACEMAKER IMPLANT;  Surgeon: Evans Lance, MD;  Location: [place] [facility];  Service: Cardiovascular;  Laterality: N/A;  . REVERSE SHOULDER ARTHROPLASTY Right 03/29/2018   Procedure: REVERSE SHOULDER ARTHROPLASTY;  Surgeon: Netta Cedars, MD;  Location: [place];  Service: Orthopedics;  Laterality: Right;  . ROUX-EN-Y GASTRIC BYPASS  1992   Conversion VBG to RnYGB in [place], [place]  . SHOULDER ARTHROSCOPY WITH SUBACROMIAL DECOMPRESSION  2016   Dr Berenice Primas, [place], [place]  . SMALL INTESTINE SURGERY  2000   SBO - LOA w SB resection  . TOTAL KNEE ARTHROPLASTY Left 11/14/2018   Procedure: TOTAL KNEE ARTHROPLASTY;  Surgeon: Paralee Cancel, MD;  Location: [facility];  Service: Orthopedics;  Laterality: Left;  70 mins  . TOTAL KNEE ARTHROPLASTY Right 04/29/2019   Procedure: TOTAL KNEE ARTHROPLASTY;  Surgeon: Paralee Cancel, MD;  Location: [facility];  Service: Orthopedics;  Laterality: Right;  70 mins  . TOTAL THYROIDECTOMY  06/26/2012   Dr Celine Ahr, [facility]  . TUBAL LIGATION  1986  . URETHRAL DILATION  2012   w cystoscopy.  Dr Gaynelle Arabian  . UTERINE FIBROID EMBOLIZATION     Social History   Socioeconomic History  . Marital status: Married    Spouse name: Not on file  . Number of children: 3  . Years of education: Not on file  . Highest education level: Master's degree (e.g., MA, MS, MEng, MEd, MSW, MBA)  Occupational History  . Occupation: retired  Tobacco Use  . Smoking status: Never Smoker  . Smokeless tobacco: Never Used  Substance and Sexual Activity  . Alcohol use: No    Comment: wine occ  . Drug use: No    Types:  Oxycodone    Comment: perscribed  . Sexual activity: Not Currently    Birth control/protection: None  Other Topics Concern  . Not on file  Social History Narrative   Right handed   1 cup of caffeine per day        Social Determinants of Health   Financial Resource Strain:   . Difficulty of Paying Living Expenses:   Food Insecurity:   . Worried About Charity fundraiser in the Last Year:   . Arboriculturist in the Last Year:   Transportation Needs:   . Film/video editor (Medical):   Marland Kitchen Lack of Transportation (Non-Medical):   Physical Activity:   . Days of Exercise per Week:   . Minutes of Exercise per Session:   Stress:   . Feeling of Stress :   Social Connections:   . Frequency of Communication with Friends and Family:   . Frequency of Social Gatherings with Friends and Family:   . Attends Religious Services:   . Active Member of Clubs or Organizations:   . Attends Archivist Meetings:   Marland Kitchen Marital Status:   Intimate Partner Violence:   . Fear of Current or Ex-Partner:   . Emotionally Abused:   Marland Kitchen Physically Abused:   . Sexually Abused:    Current Outpatient Medications on File Prior to Visit  Medication Sig Dispense Refill  . acetaminophen (TYLENOL) 500 MG tablet Take 2 tablets (1,000 mg total) by mouth every 8 (eight) hours. (Patient taking differently:  Take 1,000 mg by mouth daily. ) 30 tablet 0  . apixaban (ELIQUIS) 5 MG TABS tablet Take 1 tablet (5 mg total) by mouth 2 (two) times daily. Please resume eliquis on 5/5 pm, 30 tablet 0  . calcitRIOL (ROCALTROL) 0.5 MCG capsule Take 0.5 mcg by mouth 2 (two) times daily.     . clotrimazole (LOTRIMIN) 1 % cream APPLY TO AFFECTED AREA TWICE A DAY (Patient taking differently: Apply 1 application topically daily as needed (itching). ) 45 g 0  . cyanocobalamin (,VITAMIN B-12,) 1000 MCG/ML injection Inject 1 mL (1,000 mcg total) into the muscle every 30 (thirty) days. 1 mL 0  . cyclobenzaprine (FLEXERIL) 10 MG  tablet Take 2 tablets (20 mg total) by mouth 2 (two) times daily. (Patient taking differently: Take 20 mg by mouth at bedtime. ) 80 tablet 0  . DULoxetine (CYMBALTA) 60 MG capsule Take 1 capsule (60 mg total) by mouth daily. 30 capsule 0  . eszopiclone (LUNESTA) 2 MG TABS tablet Take 1 tablet (2 mg total) by mouth at bedtime. 20 tablet 0  . Fluticasone Furoate (ARNUITY ELLIPTA) 100 MCG/ACT AEPB Inhale 1 puff into the lungs daily. 30 each 2  . furosemide (LASIX) 20 MG tablet Take 1 tablet (20 mg total) by mouth as needed. (Patient taking differently: Take 20 mg by mouth daily as needed for fluid or edema. ) 90 tablet 2  . levETIRAcetam (KEPPRA) 500 MG tablet Take 1 tablet (500 mg total) by mouth 2 (two) times daily. 180 tablet 1  . levothyroxine (SYNTHROID) 150 MCG tablet TAKE 1 TABLET BY MOUTH EVERY DAY (Patient taking differently: Take 150 mcg by mouth daily before breakfast. ) 90 tablet 1  . magnesium oxide (MAG-OX) 400 (241.3 Mg) MG tablet Take 400 mg by mouth daily.     . NONFORMULARY OR COMPOUNDED ITEM [facility]:  Antifungal Cream - Terbinafine 3%, Fluconazole 2%, Tea Tree Oil 5%, Urea 10%, Ibuprofen 2% in DMSO Suspension #31m. Apply to affected toenail(s) once daily (at bedtime) or twice daily. (Patient taking differently: Apply 1 application topically See admin instructions. [facility]:  Antifungal Cream - Terbinafine 3%, Fluconazole 2%, Tea Tree Oil 5%, Urea 10%, Ibuprofen 2% in DMSO Suspension #330m Apply to affected toenail(s) once daily as needed for fungus.) 30 each 11  . traZODone (DESYREL) 50 MG tablet Take 1 tablet (50 mg total) by mouth at bedtime. 30 tablet 0  . Vitamin D, Ergocalciferol, (DRISDOL) 1.25 MG (50000 UT) CAPS capsule Take 1 capsule (50,000 Units total) by mouth every Wednesday. 4 capsule 0   No current facility-administered medications on file prior to visit.   Allergies  Allergen Reactions  . Hydrocodone Itching        Family History  Problem  Relation Age of Onset  . Diabetes Mother   . Epilepsy Mother   . Cancer Mother        Breast  . Hypertension Mother   . Breast cancer Mother   . Seizures Mother   . Kidney disease Father   . Diabetes Father   . Hypertension Father   . Asthma Father   . Heart disease Father   . Epilepsy Sister   . Cancer Maternal Grandmother   . Breast cancer Maternal Grandmother   . Cancer Paternal Grandmother   . Breast cancer Paternal Grandmother     PE: BP 120/80   Pulse 88   Ht 5' 7"  (1.702 m)   Wt 199 lb (90.3 kg)   SpO2 99%  BMI 31.17 kg/m  Wt Readings from Last 3 Encounters:  07/08/19 199 lb (90.3 kg)  06/26/19 202 lb (91.6 kg)  06/21/19 194 lb 4.8 oz (88.1 kg)   Constitutional: overweight, in NAD Eyes: PERRLA, EOMI, no exophthalmos ENT: moist mucous membranes, no neck masses palpable, no cervical lymphadenopathy Cardiovascular: + Irregularly irregular rhythm, RR, No MRG Respiratory: CTA B Gastrointestinal: abdomen soft, NT, ND, BS+ Musculoskeletal: no deformities, strength intact in all 4 Skin: moist, warm, no rashes Neurological: no tremor with outstretched hands, DTR normal in all 4  Assessment: 1. Papillary thyroid cancer  2. Postsurgical hypothyroidism  3.  Postsurgical  Hypocalcemia  4.  Osteoporosis  5.  Vitamin D deficiency  PLAN: 1. PTC  Patient had an incidentally found focus of micropapillary thyroid cancer, which was discovered after her thyroidectomy for enlarging nontoxic multinodular goiter.  Since the cancer was so small, this is most likely cured by her thyroidectomy. -No imaging follow-up is necessary, we will continue to follow her clinically -No neck compression symptoms except for chronic dysphagia -At this visit, we will check a thyroglobulin and ATA antibodies  2. Patient with hypothyroidism developed after thyroidectomy - latest thyroid labs reviewed with pt >> TSH elevated while in the hospital: Lab Results  Component Value Date   TSH  7.71 (H) 06/13/2019   - she continues on LT4 150 mcg daily, decreased from 200 mcg at last visit after a TSH returned suppressed - pt feels good on this dose. - we discussed about taking the thyroid hormone every day, with water, >30 minutes before breakfast, separated by >4 hours from acid reflux medications, calcium, iron, multivitamins. Pt. is taking it correctly. - will check thyroid tests today: TSH and fT4 - If labs are abnormal, she will need to return for repeat TFTs in 1.5 months  3. Patient with postoperative, iatrogenic hypocalcemia -In 2019, she had a very low blood calcium of 5.5.  Vitamin D level was normal.  She does have a history of magnesium deficiency and high phosphorus.  At that time, we started calcitriol.  Her calcium level improved and her magnesium and phosphorus normalized.  Desirable calcium level: At the lower limit of normal or slightly lower -At last visit, she was off her calcium supplement for 4 months.  An ionized calcium was normal, 4.9 (4.8-5.6).  She continues off calcium supplements. -She denies signs or symptoms of hypocalcemia: No perioral numbness, no cramping -Reviewed latest vitamin D level and this was elevated while in the hospital-she continues on ergocalciferol. -At last visit, I advised her to decrease her calcitriol supplementation from 0.5 to 0.25 mcg twice a day >> still on 0.5 mcg 2x a day -At today's visit, we will recheck her calcium level and see if we need to increase her calcitriol -Currently, Marcina Millard is out of the market after being recalled for contaminants -I will see her back in 6 months  4. Osteoporosis -Latest DEXA scan report showed osteoporosis -We started Prolia in 12/2017 and she had 3 injections so far, last on 01/21/2019 -She is tolerating this well -She did have a lower calcium at last check, unclear if related to Prolia or her postsurgical hypocalcemia -For now, would continue Prolia for at least 6 years, but we can prolong  this to 10 years -She is due for another bone density scan in 6 months.  Stable or increasing wellness T-scores are desirable, but ultimately, the best indication that the treatment is working is her not having fractures.  5.  Vitamin D deficiency -Latest vitamin D level was reviewed and this was high -Continues high-dose ergocalciferol 50,000 units weekly -We will recheck a vitamin D level today and adjust the dose of ergocalciferol.  ? If need to decrease calcitriol and add calcium.  Component     Latest Ref Rng & Units 07/08/2019          Thyroglobulin     ng/mL 1.0 (L)  Comment        TSH     0.35 - 4.50 uIU/mL 0.13 (L)  T4,Free(Direct)     0.60 - 1.60 ng/dL 1.58  Vitamin D, 25-Hydroxy     30.0 - 100.0 ng/mL 65.6  Calcium Ionized     4.8 - 5.6 mg/dL 5.1  Thyroglobulin Ab     < or = 1 IU/mL <1   Tg low. ATA undetectable. iCa normal. Will try to decrease calcitriol to 0.25 mcg 2x a day and add calcium (Tums x 1) with dinner. Will recheck calcium in 1.5 mo. TSH low >> will decrease LT4 further, to 137 mcg daily and recheck TFTs in 1.5 mo.  Philemon Kingdom, MD PhD [facility] Endocrinology

## 2019-07-08 NOTE — Telephone Encounter (Signed)
Pt returning a phone call. Pt can be reached at [phone].

## 2019-07-09 ENCOUNTER — Encounter: Payer: Self-pay | Admitting: Pulmonary Disease

## 2019-07-09 ENCOUNTER — Ambulatory Visit (INDEPENDENT_AMBULATORY_CARE_PROVIDER_SITE_OTHER): Payer: Medicare Other | Admitting: Pulmonary Disease

## 2019-07-09 VITALS — BP 104/66 | HR 85 | Temp 98.2°F | Ht 67.0 in | Wt 200.8 lb

## 2019-07-09 DIAGNOSIS — I272 Pulmonary hypertension, unspecified: Secondary | ICD-10-CM

## 2019-07-09 DIAGNOSIS — R0600 Dyspnea, unspecified: Secondary | ICD-10-CM

## 2019-07-09 DIAGNOSIS — M25561 Pain in right knee: Secondary | ICD-10-CM | POA: Diagnosis not present

## 2019-07-09 DIAGNOSIS — J4540 Moderate persistent asthma, uncomplicated: Secondary | ICD-10-CM

## 2019-07-09 DIAGNOSIS — R1310 Dysphagia, unspecified: Secondary | ICD-10-CM | POA: Diagnosis not present

## 2019-07-09 DIAGNOSIS — R0609 Other forms of dyspnea: Secondary | ICD-10-CM

## 2019-07-09 LAB — THYROGLOBULIN LEVEL: Thyroglobulin: 1 ng/mL — ABNORMAL LOW

## 2019-07-09 LAB — VITAMIN D 25 HYDROXY (VIT D DEFICIENCY, FRACTURES): Vit D, 25-Hydroxy: 65.6 ng/mL (ref 30.0–100.0)

## 2019-07-09 LAB — TSH: TSH: 0.13 u[IU]/mL — ABNORMAL LOW (ref 0.35–4.50)

## 2019-07-09 LAB — T4, FREE: Free T4: 1.58 ng/dL (ref 0.60–1.60)

## 2019-07-09 LAB — THYROGLOBULIN ANTIBODY: Thyroglobulin Ab: <1 IU/mL (ref <=1)

## 2019-07-09 LAB — CALCIUM, IONIZED: Calcium, Ion: 5.1 mg/dL (ref 4.8–5.6)

## 2019-07-09 MED ORDER — CALCITRIOL 0.25 MCG PO CAPS: 0.2500 ug | ORAL_CAPSULE | Freq: Every day | ORAL | 3 refills | Status: DC

## 2019-07-09 MED ORDER — LEVOTHYROXINE SODIUM 137 MCG PO TABS: 137.0000 ug | ORAL_TABLET | Freq: Every day | ORAL | 3 refills | Status: DC

## 2019-07-09 NOTE — Progress Notes (Signed)
[facility]  Chief Complaint  Patient presents with  . Follow-up    Constitutional:  BP 104/66 (BP Location: Right Arm, Cuff Size: Normal)   Pulse 85   Temp 98.2 F (36.8 C) (Temporal)   Ht 5\' 7"  (1.702 m)   Wt 200 lb 12.8 oz (91.1 kg)   SpO2 99% Comment: RA  BMI 31.45 kg/m   Past Medical History:  Anemia, OA, CKD, Depression, Fibromyalgia, GERD, HTN, Hypothyroidism, Nephrolithiasis, Migraine HA, Osteopenia, Unilateral vocal cord paralysis 2017, PAF, Combined CHF, Seizure 2018, CKD 3  Brief Summary:  Jillian Eaton is a 69 y.o. female with dyspnea on exertion.  Subjective:  She had pacemaker insertion recently.  Her breathing improved some.  She still get short of breath.  Can be at rest and with exertion.  No difference with inhaler therapy.  Has occasional cough with clear sputum in morning.  Not having wheeze, fever, or hemoptysis.  Had overnight oxygen test last week >> results not available yet.  Physical Exam:   Appearance - well kempt   ENMT - no sinus tenderness, no oral exudate, no LAN, Mallampati 3 airway, no stridor  Respiratory - equal breath sounds bilaterally, no wheezing or rales  CV - s1s2 regular rate and rhythm, no murmurs  Ext - no clubbing, 1+ edema  Skin - no rashes  Psych - normal mood and affect  Discussion:  She has continue dyspnea at rest and with exertion.  PFT from January 2021 was relatively unrevealing, and she has not had significant benefit from inhaler therapy.  Recent CT chest showed only Rt lower lung scarring, but no other evidence for intrinsic lung disease.  Recent Echo showed elevated pulmonary and right sided pressures.  She had recent overnight oximetry, but results not available yet.  Assessment/Plan:   Dyspnea with pulmonary hypertension. - will review overnight oximetry when results available; explained she might need repeat sleep study to further assess - she has f/u with  cardiology; might need right heart catheterization  Mild, persistent asthma. - don't think asthma is cause of ehr current symptoms - continue arnuity and prn albuterol  History of sleep disordered breathing. - will repeat ONO with room air and then determine if she needs additional testing  Atrial fibrillation, chronic combined CHF, HTN. - she has f/u with Dr. [name] later this month  A total of  33 minutes spent addressing patient care issues on day of visit.   Follow up:   Patient Instructions  Will get copy of your recent overnight oxygen test and call with results  Follow up in 6 weeks   Signature:  Chesley Mires, MD [facility] Pager: [phone] 07/09/2019, 11:23 AM  Flow Sheet     Pulmonary tests:  PFT 10/19/15 >> FEV1 1.38 (60%), FEV1% 82, TLC 3.34 (60%), DLCO 74%, +BD ABG 07/08/16 >> pH 7.38, PCO2 51.9, PO2 70 PFT 03/11/19 >> FEV1 1.44 (65%), FEV1% 83, TLC 3.97 (72%), DLCO 72%, +BD  Chest imaging:  CT angio chest 12/06/18 >> enlarged PA, CM, mosaic attenuation with GGO RUL CT chest 06/14/19 >> scarring RLL  Sleep tests:    Cardiac tests:  Echo 06/15/19 >> EF 60 to 65%, RVSP 50.7, mod LA/RA dilation, mod TR   Medications:   Allergies as of 07/09/2019      Reactions   Hydrocodone Itching         Medication List       Accurate as of Jul 09, 2019 11:23 AM. If you have any questions, ask your nurse or doctor.        acetaminophen 500 MG tablet Commonly known as: TYLENOL Take 2 tablets (1,000 mg total) by mouth every 8 (eight) hours. What changed: when to take this   apixaban 5 MG Tabs tablet Commonly known as: Eliquis Take 1 tablet (5 mg total) by mouth 2 (two) times daily. Please resume eliquis on 5/5 pm,   Arnuity Ellipta 100 MCG/ACT Aepb Generic drug: Fluticasone Furoate Inhale 1 puff into the lungs daily.   calcitRIOL 0.5 MCG capsule Commonly known as: ROCALTROL Take 0.5 mcg by mouth 2 (two) times daily.     clotrimazole 1 % cream Commonly known as: LOTRIMIN APPLY TO AFFECTED AREA TWICE A DAY What changed: See the new instructions.   cyanocobalamin 1000 MCG/ML injection Commonly known as: (VITAMIN B-12) Inject 1 mL (1,000 mcg total) into the muscle every 30 (thirty) days.   cyclobenzaprine 10 MG tablet Commonly known as: FLEXERIL Take 2 tablets (20 mg total) by mouth 2 (two) times daily. What changed: when to take this   DULoxetine 60 MG capsule Commonly known as: CYMBALTA Take 1 capsule (60 mg total) by mouth daily.   eszopiclone 2 MG Tabs tablet Commonly known as: LUNESTA Take 1 tablet (2 mg total) by mouth at bedtime.   furosemide 20 MG tablet Commonly known as: LASIX Take 1 tablet (20 mg total) by mouth as needed. What changed:   when to take this  reasons to take this   levETIRAcetam 500 MG tablet Commonly known as: Keppra Take 1 tablet (500 mg total) by mouth 2 (two) times daily.   levothyroxine 150 MCG tablet Commonly known as: SYNTHROID TAKE 1 TABLET BY MOUTH EVERY DAY What changed: when to take this   magnesium oxide 400 (241.3 Mg) MG tablet Commonly known as: MAG-OX Take 400 mg by mouth daily.   NONFORMULARY OR COMPOUNDED ITEM [facility]:  Antifungal Cream - Terbinafine 3%, Fluconazole 2%, Tea Tree Oil 5%, Urea 10%, Ibuprofen 2% in DMSO Suspension #65ml. Apply to affected toenail(s) once daily (at bedtime) or twice daily. What changed:   how much to take  how to take this  when to take this  additional instructions   traZODone 50 MG tablet Commonly known as: DESYREL Take 1 tablet (50 mg total) by mouth at bedtime.   Vitamin D (Ergocalciferol) 1.25 MG (50000 UNIT) Caps capsule Commonly known as: DRISDOL Take 1 capsule (50,000 Units total) by mouth every Wednesday.       Past Surgical History:  She  has a past surgical history that includes Cholecystectomy (1990); Tubal ligation (1986); Cardiac catheterization (04/04/2010); Colonoscopy  w/ polypectomy; Colonoscopy with propofol (N/A, 04/10/2014); Cardioversion (N/A, 08/08/2017); Eye surgery; Reverse shoulder arthroplasty (Right, 03/29/2018); Roux-en-Y Gastric Bypass (1992); Gastroplasty vertical banded (1985); Laparoscopic lysis intestinal adhesions (1995); Total thyroidectomy (06/26/2012); Urethral dilation (2012); Shoulder arthroscopy with subacromial decompression (2016); Small intestine surgery (2000); Abdominal adhesion surgery (2004); Total knee arthroplasty (Left, 11/14/2018); Uterine fibroid embolization; Total knee arthroplasty (Right, 04/29/2019); Cardioversion (N/A, 06/18/2019); PACEMAKER IMPLANT (N/A, 06/20/2019); and AV NODE ABLATION (N/A, 06/20/2019).  Family History:  Her family history includes Asthma in her father; Breast cancer in her maternal grandmother, mother, and paternal grandmother; Cancer in her maternal grandmother, mother, and paternal grandmother; Diabetes in her father and mother; Epilepsy in her mother and sister; Heart disease in her father; Hypertension in her father and mother; Kidney disease in her father; Seizures in her mother.  Social History:  She  reports that she has never smoked. She has never used smokeless tobacco. She reports that she does not drink alcohol or use drugs.

## 2019-07-09 NOTE — Telephone Encounter (Signed)
I attempted to call the patient this morning, but she was about to get a tattoo and couldn't speak with me. Will try again tomorrow.

## 2019-07-09 NOTE — Patient Instructions (Signed)
Will get copy of your recent overnight oxygen test and call with results  Follow up in 6 weeks

## 2019-07-10 NOTE — Telephone Encounter (Signed)
Pr op clearance note added to appt notes.

## 2019-07-10 NOTE — Telephone Encounter (Signed)
Preop team  Can we add surgical clearance to her appointment with Dr. [name] tomorrow, 07/11/2019?  Thank you Sharee Pimple

## 2019-07-11 ENCOUNTER — Ambulatory Visit (INDEPENDENT_AMBULATORY_CARE_PROVIDER_SITE_OTHER): Payer: Medicare Other | Admitting: Internal Medicine

## 2019-07-11 ENCOUNTER — Encounter: Payer: Self-pay | Admitting: Internal Medicine

## 2019-07-11 ENCOUNTER — Other Ambulatory Visit: Payer: Self-pay

## 2019-07-11 VITALS — BP 130/64 | HR 76 | Ht 67.0 in | Wt 201.0 lb

## 2019-07-11 DIAGNOSIS — I272 Pulmonary hypertension, unspecified: Secondary | ICD-10-CM

## 2019-07-11 DIAGNOSIS — Z950 Presence of cardiac pacemaker: Secondary | ICD-10-CM | POA: Diagnosis not present

## 2019-07-11 DIAGNOSIS — R0600 Dyspnea, unspecified: Secondary | ICD-10-CM

## 2019-07-11 DIAGNOSIS — Z9889 Other specified postprocedural states: Secondary | ICD-10-CM | POA: Diagnosis not present

## 2019-07-11 DIAGNOSIS — I482 Chronic atrial fibrillation, unspecified: Secondary | ICD-10-CM | POA: Diagnosis not present

## 2019-07-11 DIAGNOSIS — M25561 Pain in right knee: Secondary | ICD-10-CM | POA: Diagnosis not present

## 2019-07-11 NOTE — Patient Instructions (Signed)
Medication Instructions:  The current medical regimen is effective;  continue present plan and medications.  *If you need a refill on your cardiac medications before your next appointment, please call your pharmacy*   Follow-Up: At [facility], you and your health needs are our priority.  As part of our continuing mission to provide you with exceptional heart care, we have created designated Provider Care Teams.  These Care Teams include your primary Cardiologist (physician) and Advanced Practice Providers (APPs -  Physician Assistants and Nurse Practitioners) who all work together to provide you with the care you need, when you need it.  We recommend signing up for the patient portal called "MyChart".  Sign up information is provided on this After Visit Summary.  MyChart is used to connect with patients for Virtual Visits (Telemedicine).  Patients are able to view lab/test results, encounter notes, upcoming appointments, etc.  Non-urgent messages can be sent to your provider as well.   To learn more about what you can do with MyChart, go to [email or web address].    Your next appointment:   3 month(s)  The format for your next appointment:   In Person  Provider:   K. Mali Hilty, MD

## 2019-07-11 NOTE — Progress Notes (Signed)
OFFICE NOTE  Chief Complaint:  Follow-up dyspnea  Primary Care Physician: [name], MD  HPI:  Jillian Eaton is a 69 year old female I saw a few weeks ago with a history of super morbid obesity, fibromyalgia and increasing shortness of breath. She had a cath in 2012 which showed normal coronaries, mildly elevating filling pressures, and diastolic pressures with a mean PA of 31. This correlated with her echocardiogram when RV systolic pressures in 5397 were 49 on echo. A repeat echocardiogram was just performed which demonstrated a preserved LVEF of 60-65% with moderate concentric LVH. There was mild to moderate increase in pulmonary artery pressure with peak at 51, which has not significantly changed from her study 1 year ago. Although the pressures look very similar her shortness of breath has been increasing significantly, and I recommended that she wear oxygen at night since she had that at home which does seem to be helping her at least feel better during the day as I suspect she has sleep apnea. She has been tested before which was apparently negative and is considering retesting at some point in the future. Overall I think the main issue obviously is weight, and she unfortunately says that she is not a candidate for gastric bypass and therefore it is a very difficult situation.  I referred her back to her pulmonologist in [facility] for ongoing evaluation of pulmonary hypertension. She tells me that she was then referred to [facility] and saw a specialist there who did another right heart catheterization but did not recommend any medications other than better blood pressure control.   In addition she had problems with kidney stones and underwent 2 operations regarding tthis.  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  She is now dependent on thyroid medication. Unfortunately she's not been able to lose weight, but her weight is fairly  stable around 400 pounds as is her shortness of breath.  Cath in 2012:  LEFT HEART CATHETERIZATION  OPERATOR: Mali Klare Criss, MD, and Rolland Porter, MD  INDICATION: Dyspnea on exertion and chest pressure.  HISTORY OF PRESENT ILLNESS: [name] is a morbidly obese (BMI greater than 63) female with a history of failed gastric bypass x2 with an increasing weight gain and increasing shortness of breath as well as new- onset dyspnea on exertion. She reports that she can only walk about 10 feet before she becomes short of breath and has chest pressure which she says get better with rest. She has numerous cardiac risk factors and given the high likelihood of false positive stress test, I have referred her for cardiac catheterization, both left and right heart as she has had elevated RVSP on echocardiogram of approximately 30 to 31 mmHg.  PROCEDURE: The patient was brought into the cardiac catheterization lab, sterilely prepped and draped in the usual fashion. The area around the right femoral artery and right brachial vein were cleansed and draped to allow an attempt at radial arterial and brachiocephalic venous access. IV was not able to be obtained prior to the procedure given her body habitus. With difficulty in assessing the vein, the ultrasound was eventually used to identify the right brachiocephalic vein, however, cannulation with needle and wire was not possible as the vein was very small in caliber. After mild local bleeding was controlled, we did turn our attention to the right femoral vein and with great difficulty using the ultrasound, the right femoral vein was accessed by Dr.  Ellyn Hack using a straight wire and a needle. After venous access was obtained, the attention was turned to the right radial artery by Dr. Ellyn Hack and simultaneous to that right heart catheterization was performed by myself without any difficulty. The right radial artery was  successfully cannulated and subsequently left coronary artery system was selectively injected with a 5-French TIG 4.0 catheter, however the right coronary artery could not be cannulated with the TIG catheter and was eventually cannulated with a JR-4 catheter. LV pressure was measured with a pigtail catheter. Estimated blood loss was about 30 mL. There were no acute complications. The patient received a total of 9 mg of Versed throughout the procedure as well as 225 mcg of fentanyl and was at no point greater than moderately sedated. She received 5000 units of heparin about 10 mL of a radial cocktail.  FINDINGS: 1. Left main - short, no disease. 2. LAD - no significant disease. 3. Left circumflex, no significant disease. 4. RCA - dominant, no disease, large-caliber vessel. 5. LVEDP = 20 mmHg. 6. RA - 12. 7. RV 38/12. 8. PA - 43/19 (31). 9. PCWP - 24. 10.TPG - 7. 11.Fick cardiac output/Fick cardiac index - 10.56/3.84. 12.Thermodilution cardiac output/thermodilution cardiac index -  6.78/2.47. 13.Aortic saturation - 94%. 14.PA saturation - 68%.  IMPRESSION: 1. No significant obstructive coronary artery disease. 2. LVEDP = 20 mmHg. 3. Borderline pulmonary venous hypertension. 4. High cardiac output.  [name] returns today for follow-up. She reports that her shortness of breath has not significantly worsened, in fact, possibly is slightly better. She did have thyroid surgery last year at [facility] and apparently underwent right heart catheterization prior to that. I do not have those records immediately available. Unfortunately she continues to maintain her weight and has not been able to lose any. She is complaining of some numbness and tingling in her feet which is likely neuropathy. She is on medication including Lyrica which she takes for fibromyalgia.  [name] returns today for follow-up. She recently is been having more shortness of breath  and lower extremity swelling. She pointed out edema in her legs with very small blisters and some chronic venous stasis changes. She is not currently on a diuretic. She recently saw another new primary care provider with the [facility]. She was noted to be started on lisinopril 40 mg daily, which she is taking in addition to losartan 100 mg daily. The notes do not indicate from her office visit why she was started on lisinopril, but I can see through care everywhere that this was ordered by her primary care provider. She also takes amlodipine for blood pressure control. Her blood pressure was elevated initially at 178/94, but after resting came down to 120/78 and is at goal today. It is unusual, however to use both ACE inhibitors and ARBs.  07/02/2015  Mrs. Peine returns today for follow-up. She underwent a repeat echocardiogram for progressive dyspnea and exertion which showed a preserved LVEF of 6065% however there is moderate to severe pulmonary hypertension with an RVSP of 64 mmHg. This is increased about 10 mmHg compared to her prior study. Her mean pulmonary pressure by catheterization in 2012 was only 31 mmHg. I suspect that progressive pulmonary hypertension as a cause of her worsening shortness of breath. In fact, during recent hospitalization for surgery she required discharged with oxygen. She says she rarely uses oxygen at home but notes that she is hypoxic often when she checks her oxygen  levels. At her last office visit I recommended Lasix which she has been using sparingly. She has problems with incontinence and she does report an improvement in her swelling with it but does not notice significant change in her shortness of breath.  06/06/2016  Mrs. Osterberg returns from hospital follow-up. She was just admitted after an admission in January for unintentional narcotic overdose. She had unresponsiveness and possible aspiration. There was a second admission for a similar presentation  with respiratory failure, fever, sepsis and ultimately she was found to have a new onset cardiomyopathy with EF as low as 25%. Was felt that this was nonischemic. She was having intermittent atrial tachycardia and was felt that this might need to be managed with antiarrhythmic therapy however it seems to have resolved with carvedilol. She was placed on diuretics and reports that her breathing is close to baseline. She remains hypoxic with an O2 saturation 93% however was on home oxygen is result of her severe pulmonary hypertension. She does not feel that she needs to use the oxygen very regularly. From a heart failure standpoint she is on carvedilol, aspirin, furosemide and not currently on an ACE inhibitor, ARB or Entresto. Recent testing a renal function shows normal creatinine.  10/11/2016  Mrs. Tawney is seen today in follow-up. In July she was hospitalized for hyperkalemia. She is on supplemental potassium and lisinopril, both were stopped. Fortunately her echo has improved back to normal recently. Overall she feels well. Her weight is down about 40 pounds of the recently she gained about 20 pounds back. She is working on that right as we speak.  04/03/2017  Mrs. Hogle was seen today in follow-up.  She recently called in and had complaints of worsening shortness of breath.  She saw her pulmonary doctor who did an x-ray noted a mild right upper lobe infiltrate versus edema.  Labs were obtained including BNP which was only mildly elevated at 110.  She was given 20 mg of Lasix with no benefit and then recently was advised over the phone to increase her Lasix to 40 mg for 2 days.  She urinated quite a bit and lost about 5 pounds of what she feels was water weight.  She reports mild improvement in her breathing.  She is quite anxious about what might have led to this fluid gain.  Given her history of cardiomyopathy, it is possible she could have had some recurrent cardiomyopathy.  She reports stable diet and  no worsening abuse of sodium.  06/19/2017  Mrs. Arp returns today for follow-up.  She was supposed to have a limited echocardiogram but that never happened.  Approximately 2 to 3 weeks ago she called the office reporting palpitations.  We try to schedule an earlier appointment, but she ended up going to [facility] for her [facility].  On the flight she apparently became short of breath had some left flank pain and was hypoxic.  She was given oxygen and after landing they took her to the [facility] in [place].  She was treated there and found to be in A. fib with RVR.  This is a new finding.  She had a remote history of either PAT or PAF, but so brief that she was not anticoagulated.  She was then started on Xarelto.  She tells me she was given Lasix and diuresed.  She returns today for follow-up and still reports some shortness of breath.  Weight is about 6 pounds heavier than she was 2 months ago.  She is in A. fib persistently with heart rates in the low 100s.  In addition she reports after starting the Xarelto that she has been having some vaginal bleeding.  07/26/2017  Mrs. Branaman returns today for follow-up.  She was recently seen in the hospital and discharged on 07/17/2017.  Today is a transition of care follow-up.  She was contacted on the day after discharge and felt to be doing well.  She is currently in rehabilitation.  She says she has had marked improvement in her shortness of breath.  She feels like her edema has improved.  She has decreased her dose of daily Lasix from 80 mg to 40 mg.  She is in persistent atrial fibrillation however rate controlled on amiodarone 400 mg which she is taking twice daily.  She reports she is getting stronger and hopefully will be out of rehabilitation within the next week.  She did miss 1 dose of Eliquis due to a transfer issue on May 28 and therefore will need 3 weeks of subsequent uninterrupted anticoagulation prior to attempted  cardioversion.  09/13/2017  Mrs. Erdman returns today for follow-up of cardioversion.  Unfortunately this was unsuccessful after 3 shocks.  She told me there is some confusion afterwards that she forgot to take her amiodarone.  She was taking it up until the cardioversion.  Since it was unsuccessful, I recommended rate control strategy.  At this point there is little benefit from being on amiodarone and the side effect profile would not make it favorable.  I would recommend just continuing carvedilol for rate control.  She is had significant weight loss now down to 313 from 340.  This will continue to help her symptoms.  Her shortness of breath has improved.  She has had no significant worsening edema.  No further significant GU bleeding.  09/28/2017  Mrs. Gilkerson returns today for follow-up of multiple ER visits.  I last saw her a couple weeks ago and unfortunately she was maintaining A. fib after unsuccessful cardioversion.  She continues to lose weight.  In fact she is down to 306 pounds today from 313.  Lab work indicates a rising creatinine and very low BNP, which makes me feel that she may be over diuresed.  She is also noted to be anemic with iron deficiency and is anticoagulated on Eliquis.  Her PCP is likely to start her on iron.  She denies any blood in the stool however stool guaiacs or work-up for microscopic iron is indicated.  She reports intermittent hematuria which is been minor.  Blood pressure is noted to be low today 94/69.  She is on low-dose carvedilol for rate control as well as hydrochlorothiazide and low-dose Lasix.  Recently she said she has had improvement in her breathing and for the first time the other day was able to sleep without oxygen.  10/12/2017  Mrs. Garcia was seen again today in follow-up.  She called the office that she noted to have significant weight gain.  In July she was 313 and had lost weight down to 297.  Subsequently was found out that she was possibly overtreated  with her thyroid medication and her dose was decreased from 300 mcg to 200 mcg daily by her endocrinologist.  Since then she has gained weight but she has been up 12 pounds over the past 2 weeks.  She does report some edema.  I had stopped her hydrochlorothiazide due to hypotension and blood pressure is better today 112/70.  She takes low-dose  Lasix 20 mg daily.  She also reported that she now has stage III chronic kidney disease.  12/13/2017  Mrs. Patchin returns today for follow-up.  She is done well and felt very stable.  Although her weight is up about 10 pounds she denies any worsening fluid.  She remains on 20 mg Lasix daily.  She is been working with her endocrinologist who reduced her levothyroxine.  Some of the weight gain may be related to that as she was on a very high dose of 300 mcg daily.  She denies any worsening shortness of breath.  She is having problems with her right shoulder and is scheduled to go arthroplasty by Dr. Alma Friendly.  She is here today for preoperative risk assessment.  She denies any chest pain.  She is on Eliquis for A. fib which is persistent but rate control.  She would have to stop this typically about 3 days prior to the procedure.  01/15/2018  Mrs.Quinney is seen today for follow-up of recent surgery.  Actually she did not have surgery rather was induced by anesthesia and then had significant hypotension and instability requiring IV fluids and pressors.  Based on that response anesthesia recommended that the surgery be canceled.  I spoke with Dr. Alma Friendly subsequent to that about the episode and agreed to see her back to help determine why she may have had such a response.  She actually had significant improvement in LV function by echo 6 months ago back to normal.  She has not had recurrent A. fib and has been euvolemic.  She is on a diuretic.  Weight has been going back up however this is been related to changing her thyroid medications as she was iatrogenically hyperthyroid.   Stress testing was not pursued because she has a history of normal coronaries by cath in 2012 and since her EF improved recently with medical therapy alone, this would have been unlikely if she had significant coronary disease.  I suspect that her hypotension was due to being volume depleted and that she had a marked response to the propofol leading to her hypotension.  06/04/2018  Ms. Lepore was seen today for hospital follow-up as a transition of care 7 visit.  Unfortunately she was admitted with confusion and seizure.  She was started on Keppra.  She did recover from this but was noted to be in A. fib with RVR.  Her carvedilol was switched to Toprol-XL 100 mg daily.  As she had hypotension, her diuretic was discontinued.  Today she reports her weight is even lower.  She denies any new swelling.  She has had some confusion and feels hot and chilled at times.  We had difficulty getting her blood pressure today however I believe it is between 90 and 588 systolic.  This is consistent with her discharge blood pressure readings.  She is concerned she may be on too much metoprolol.  09/09/2018  Ms. Mortimer is seen today in follow-up.  Overall she is doing well.  In fact she is lost more weight.  She is now 249 pounds.  She is trying to lose enough weight where she might be a candidate for surgery.  She is not had any further seizure activity.  She does have follow-up with her neurologist tomorrow who may adjust her antiepileptic medications.  I had decreased her beta-blocker because of hypotension.  Blood pressure was actually not able to be obtained today.  She says at home generally runs around 502 systolic.  She  is not had any presyncopal episodes related to this.  Overall she feels well.  She is actually expressed some interest in exertion and perhaps pulmonary rehabilitation for diastolic heart failure.  She generally has NYHA class II-III symptoms.  12/05/2018  Ms. Parcher is seen back today in follow-up.  In  the interim she is [name], NP, is working with her for shortness of breath.  Some additional testing was performed that showed some anemia.  Was also thought may be there was some congestive heart failure.  She was then seen in the ER with concerns about possible DVT/PE.  Work-up there was negative and the chest x-ray suggested the possibility of a pericardial effusion and further testing was recommended.  BNP was close to 300 but not significantly elevated.  Subsequent to that she had started taking Lasix now on a daily basis.  Her last echo showed a low normal LVEF of 50 to 55% with trivial pericardial fluid back in April 2020.  She remains in persistent atrial fibrillation which is rate controlled.  01/24/2019  Ms. Stamp is seen today for follow-up of her dyspnea.  She continues to have shortness of breath.  She is scheduled to have another knee replacement surgery coming up.  She is also ordered for pulmonary function testing and does have a pulmonary follow-up in January.  I suspect there may be a pulmonary issue.  Her weight is gone up about 10 pounds as well however she feels that this may be fluid related.  She is also noted increased lower extremity swelling.  She has been using Lasix 40 mg as needed but not regularly.  I think she may need to be on daily diuretic.  04/02/2019  Ms. Froio returns today for follow-up.  She reports her dyspnea has improved.  She was thought to have moderate persistent asthma and her symptoms have improved with adjustment in her inhalers.  She also was found to be mildly volume overloaded on increased her diuretics.  Finally she was iatrogenically hyperthyroid.  This was corrected as well.  She also had some mild anemia which was improved with iron.  Overall the combination of these factors made her short of breath and somewhat fatigued.  She ultimately did not undergo her knee surgery but plans to reschedule in the near future.  From a cardiac standpoint she is  at acceptable risk for this.  07/11/2019  Ms. Murdock returns today for follow-up.  She continues to have some persistent dyspnea.  She was recently hospitalized and with difficult to control atrial fibrillation and ultimately underwent AV node ablation and permanent pacemaker placement.  Since then she has had better heart rate control with some mild improvement in her dyspnea but reports is still not to the level that she expects.  She has been seen by pulmonary as well I do not feel like there is ongoing reason for her dyspnea other than possibly pulmonary hypertension.  A previous echo had shown mean pulmonary pressure around 50.  She could have a component of exercise-induced pulmonary hypertension that is more severe.  Her weight has finally stabilized around 200 pounds.  PMHx:  Past Medical History:  Diagnosis Date  . Anemia    iron and pernicious  . Arthritis   . Asthma    flare up last yr  . CHF (congestive heart failure) ([place])   . Chronic headache    " better now "   . Complication of anesthesia    BP dropped   .  DOE (dyspnea on exertion)    2D ECHO, 02/12/2012 - EF 60-65%, moderate concentric hypertrophy  . Dysrhythmia   . Fibromyalgia    nerve pain"left side at waist level" "can't lay on that side without pain" , "HOB elevation helps"; pt. thinks this has resolved after 200lb weight loss9-17- since i lost the wieght , i dont  think i have this anymore   . Heart murmur   . Hematuria - cause not known    resolved   . History of kidney stones    x 2 '13, '14 surgery to remove  . Hypertension   . Pneumonia    aspiration  04/2016  . SBO (small bowel obstruction) ([place])    rqueired admission 2020  . Thyroid disease    "goiter"  . Transfusion history    10 yrs+    Past Surgical History:  Procedure Laterality Date  . ABDOMINAL ADHESION SURGERY  2004   open LOA - in [place]  . AV NODE ABLATION N/A 06/20/2019   Procedure: AV NODE ABLATION;  Surgeon: Evans Lance, MD;  Location:  [facility];  Service: Cardiovascular;  Laterality: N/A;  . CARDIAC CATHETERIZATION  04/04/2010   No significant obstructive coronary artery disease  . CARDIOVERSION N/A 08/08/2017   Procedure: CARDIOVERSION;  Surgeon: Pixie Casino, MD;  Location: [facility] ENDOSCOPY;  Service: Cardiovascular;  Laterality: N/A;  . CARDIOVERSION N/A 06/18/2019   Procedure: CARDIOVERSION;  Surgeon: Pixie Casino, MD;  Location: [facility];  Service: Cardiovascular;  Laterality: N/A;  . CHOLECYSTECTOMY  1990  . COLONOSCOPY W/ POLYPECTOMY    . COLONOSCOPY WITH PROPOFOL N/A 04/10/2014   Procedure: COLONOSCOPY WITH PROPOFOL;  Surgeon: Beryle Beams, MD;  Location: [facility] ENDOSCOPY;  Service: Endoscopy;  Laterality: N/A;  . EYE SURGERY     lasik 20-25 yrs ago  . GASTROPLASTY VERTICAL BANDED  1985  . [place]   In [place] for SBO  . PACEMAKER IMPLANT N/A 06/20/2019   Procedure: PACEMAKER IMPLANT;  Surgeon: Evans Lance, MD;  Location: [place] [facility];  Service: Cardiovascular;  Laterality: N/A;  . REVERSE SHOULDER ARTHROPLASTY Right 03/29/2018   Procedure: REVERSE SHOULDER ARTHROPLASTY;  Surgeon: Netta Cedars, MD;  Location: [facility];  Service: Orthopedics;  Laterality: Right;  . ROUX-EN-Y GASTRIC BYPASS  1992   Conversion VBG to RnYGB in [place], [place]  . SHOULDER ARTHROSCOPY WITH SUBACROMIAL DECOMPRESSION  2016   Dr Berenice Primas, [place], [place]  . SMALL INTESTINE SURGERY  2000   SBO - LOA w SB resection  . TOTAL KNEE ARTHROPLASTY Left 11/14/2018   Procedure: TOTAL KNEE ARTHROPLASTY;  Surgeon: Paralee Cancel, MD;  Location: [facility];  Service: Orthopedics;  Laterality: Left;  70 mins  . TOTAL KNEE ARTHROPLASTY Right 04/29/2019   Procedure: TOTAL KNEE ARTHROPLASTY;  Surgeon: Paralee Cancel, MD;  Location: [facility];  Service: Orthopedics;  Laterality: Right;  70 mins  . TOTAL THYROIDECTOMY  06/26/2012   Dr Celine Ahr, [facility]  . TUBAL LIGATION  1986  . URETHRAL DILATION  2012    w cystoscopy.  Dr Gaynelle Arabian  . UTERINE FIBROID EMBOLIZATION      FAMHx:  Family History  Problem Relation Age of Onset  . Diabetes Mother   . Epilepsy Mother   . Cancer Mother        Breast  . Hypertension Mother   . Breast cancer Mother   . Seizures Mother   . Kidney disease Father   .  Diabetes Father   . Hypertension Father   . Asthma Father   . Heart disease Father   . Epilepsy Sister   . Cancer Maternal Grandmother   . Breast cancer Maternal Grandmother   . Cancer Paternal Grandmother   . Breast cancer Paternal Grandmother     SOCHx:   reports that she has never smoked. She has never used smokeless tobacco. She reports that she does not drink alcohol or use drugs.  ALLERGIES:  Allergies  Allergen Reactions  . Hydrocodone Itching         ROS: Pertinent items noted in HPI and remainder of comprehensive ROS otherwise negative.  HOME MEDS: Current Outpatient Medications  Medication Sig Dispense Refill  . acetaminophen (TYLENOL) 500 MG tablet Take 2 tablets (1,000 mg total) by mouth every 8 (eight) hours. (Patient taking differently: Take 1,000 mg by mouth daily. ) 30 tablet 0  . apixaban (ELIQUIS) 5 MG TABS tablet Take 1 tablet (5 mg total) by mouth 2 (two) times daily. Please resume eliquis on 5/5 pm, 30 tablet 0  . calcitRIOL (ROCALTROL) 0.25 MCG capsule Take 1 capsule (0.25 mcg total) by mouth daily. 90 capsule 3  . clotrimazole (LOTRIMIN) 1 % cream APPLY TO AFFECTED AREA TWICE A DAY (Patient taking differently: Apply 1 application topically daily as needed (itching). ) 45 g 0  . cyanocobalamin (,VITAMIN B-12,) 1000 MCG/ML injection Inject 1 mL (1,000 mcg total) into the muscle every 30 (thirty) days. 1 mL 0  . cyclobenzaprine (FLEXERIL) 10 MG tablet Take 2 tablets (20 mg total) by mouth 2 (two) times daily. 80 tablet 0  . DULoxetine (CYMBALTA) 60 MG capsule Take 1 capsule (60 mg total) by mouth daily. 30 capsule 0  . eszopiclone (LUNESTA) 2 MG TABS tablet  Take 1 tablet (2 mg total) by mouth at bedtime. 20 tablet 0  . Fluticasone Furoate (ARNUITY ELLIPTA) 100 MCG/ACT AEPB Inhale 1 puff into the lungs daily. 30 each 2  . furosemide (LASIX) 20 MG tablet Take 1 tablet (20 mg total) by mouth as needed. (Patient taking differently: Take 20 mg by mouth daily as needed for fluid or edema. ) 90 tablet 2  . levETIRAcetam (KEPPRA) 500 MG tablet Take 1 tablet (500 mg total) by mouth 2 (two) times daily. 180 tablet 1  . levothyroxine (SYNTHROID) 137 MCG tablet Take 1 tablet (137 mcg total) by mouth daily before breakfast. 45 tablet 3  . magnesium oxide (MAG-OX) 400 (241.3 Mg) MG tablet Take 400 mg by mouth daily.     . NONFORMULARY OR COMPOUNDED ITEM [facility]:  Antifungal Cream - Terbinafine 3%, Fluconazole 2%, Tea Tree Oil 5%, Urea 10%, Ibuprofen 2% in DMSO Suspension #20ml. Apply to affected toenail(s) once daily (at bedtime) or twice daily. (Patient taking differently: Apply 1 application topically See admin instructions. [facility]:  Antifungal Cream - Terbinafine 3%, Fluconazole 2%, Tea Tree Oil 5%, Urea 10%, Ibuprofen 2% in DMSO Suspension #53ml. Apply to affected toenail(s) once daily as needed for fungus.) 30 each 11  . traZODone (DESYREL) 50 MG tablet Take 1 tablet (50 mg total) by mouth at bedtime. 30 tablet 0  . Vitamin D, Ergocalciferol, (DRISDOL) 1.25 MG (50000 UT) CAPS capsule Take 1 capsule (50,000 Units total) by mouth every Wednesday. 4 capsule 0   No current facility-administered medications for this visit.    LABS/IMAGING: No results found for this or any previous visit (from the past 48 hour(s)). No results found.  VITALS: BP 130/64  Pulse 76   Ht 5\' 7"  (1.702 m)   Wt 201 lb (91.2 kg)   SpO2 100%   BMI 31.48 kg/m   EXAM: General appearance: alert and mildly obese Neck: no carotid bruit and no JVD Lungs: diminished breath sounds bilaterally Heart: regular rate and rhythm Abdomen: soft, non-tender; bowel sounds  normal; no masses,  no organomegaly Extremities: extremities normal, atraumatic, no cyanosis or edema Pulses: 2+ and symmetric Skin: Skin color, texture, turgor normal. No rashes or lesions Neurologic: Grossly normal Psych: Pleasant  EKG: The paced rhythm at 76-personally reviewed  ASSESSMENT: 1. Persistent DOE 2. Anesthesia-associated hypotension 3. A. fib with RVR-CHADSVASC score of 4 - s/p AVN ablation and PPM placement (Medtronic) - pacer dependent 4. Chronic diastolic congestive heart failure-LVEF 25% (improved to 60-65% in 06/2017) 5. Super morbid obesity, with failure of 3 gastric bypass procedures -recent significant weight loss 6. Pulmonary hypertension - PA pressure of 64 mmHg, normal LV systolic function 7. Progressive DOE 8. Hypertension-controlled  9. Possible A. fib/ectopic atrial tachycardia-resolved 10. Seizures  PLAN: 1.   Ms. Arnoux has had persistent dyspnea despite recent AV node ablation to treat her atrial fibrillation and pacemaker placement.  She continues to have swallowing issues which may have contributed to her extraordinary weight loss.  She has an upcoming EGD to further evaluate this.  It does not appear that there is a likely pulmonary parenchymal cause of her shortness of breath but rather she has struggled with pulmonary hypertension in the past.  Ultimately she will likely need right and left heart catheterization to further evaluate her dyspnea.  I would like for her to undergo her EGD procedure first and then ultimately we could consider that.  Especially since she just had recent pacemaker wires put in, there may be increased risk of doing right heart catheterization.  Pixie Casino, MD, [facility], [place] Director of the [facility] Lipid Disorders &  Cardiovascular Risk Reduction Clinic Diplomate of the American Board of Clinical Lipidology Attending Cardiologist  Direct Dial: [phone]  Fax: [phone]   Website:  [email or web address] 07/11/2019, 4:30 PM

## 2019-07-13 ENCOUNTER — Encounter: Payer: Self-pay | Admitting: Internal Medicine

## 2019-07-14 DIAGNOSIS — R1084 Generalized abdominal pain: Secondary | ICD-10-CM | POA: Diagnosis not present

## 2019-07-14 DIAGNOSIS — N201 Calculus of ureter: Secondary | ICD-10-CM | POA: Diagnosis not present

## 2019-07-15 DIAGNOSIS — M25561 Pain in right knee: Secondary | ICD-10-CM | POA: Diagnosis not present

## 2019-07-15 DIAGNOSIS — N201 Calculus of ureter: Secondary | ICD-10-CM | POA: Diagnosis not present

## 2019-07-17 ENCOUNTER — Telehealth: Payer: Self-pay | Admitting: Internal Medicine

## 2019-07-17 DIAGNOSIS — M19012 Primary osteoarthritis, left shoulder: Secondary | ICD-10-CM | POA: Diagnosis not present

## 2019-07-17 DIAGNOSIS — M25512 Pain in left shoulder: Secondary | ICD-10-CM | POA: Diagnosis not present

## 2019-07-17 NOTE — Telephone Encounter (Signed)
Primary Cardiologist:Kenneth C Hilty, MD  Chart reviewed as part of pre-operative protocol coverage. Because of [place] Gaynelle Bowker's past medical history and time since last visit, he/she will require a follow-up visit in order to better assess preoperative cardiovascular risk.  Pre-op covering staff: - Please schedule appointment and call patient to inform them. - Please contact requesting surgeon's office via preferred method (i.e, phone, fax) to inform them of need for appointment prior to surgery.  Patient with continued dyspnea of unknown origin saw Dr. [name] on 07/11/2019.  May be related to pulmonary hypertension or cardiac causes.  Patient to undergo EGD for further evaluation.  May need cardiac catheterization post EGD.  Patient needs further evaluation for dyspnea prior to cardiac clearance.  If applicable, this message will also be routed to pharmacy pool and/or primary cardiologist for input on holding anticoagulant/antiplatelet agent as requested below so that this information is available at time of patient's appointment.   Deberah Pelton, NP  07/17/2019, 3:52 PM

## 2019-07-17 NOTE — Telephone Encounter (Signed)
Patient with diagnosis of afib on Eliquis for anticoagulation.    Procedure: cystoscopy Date of procedure: TBD  CHADS2-VASc score of 4 (age, sex, CHF, HTN)  CrCl 63mL/min using adjusted body weight Platelet count 220K  Per office protocol, patient can hold Eliquis for 2 days prior to procedure.

## 2019-07-17 NOTE — Telephone Encounter (Signed)
New message     [facility] Pre-operative Risk Assessment    HEARTCARE STAFF: - Please ensure there is not already an duplicate clearance open for this procedure. - Under Visit Info/Reason for Call, type in Other and utilize the format Clearance MM/DD/YY or Clearance TBD. Do not use dashes or single digits. - If request is for dental extraction, please clarify the # of teeth to be extracted.  Request for surgical clearance:  1. What type of surgery is being performed? Cystoscopy with Ureteroscopy laser lithotripsy and stint placement   2. When is this surgery scheduled? TBD  3. What type of clearance is required (medical clearance vs. Pharmacy clearance to hold med vs. Both)? Both  4. Are there any medications that need to be held prior to surgery and how long? Hold Eliquis 2-3 days prior   5. Practice name and name of physician performing surgery? [facility] ; [place] [facility] [place]   6. What is the office phone number? [place]   7.   What is the office fax number? [phone]  8.   Anesthesia type (None, local, MAC, general) ? General   Jillian Eaton 07/17/2019, 11:26 AM  _________________________________________________________________   (provider comments below)

## 2019-07-17 NOTE — Telephone Encounter (Signed)
Pt has appt scheduled 6-16 for 1 mo follow up. Will address pre-op at that time

## 2019-07-22 ENCOUNTER — Encounter: Payer: Self-pay | Admitting: Student

## 2019-07-22 ENCOUNTER — Other Ambulatory Visit (HOSPITAL_COMMUNITY)
Admission: RE | Admit: 2019-07-22 | Discharge: 2019-07-22 | Disposition: A | Discharge status: Home / Self Care | Payer: Medicare Other | Source: Ambulatory Visit | Attending: Gastroenterology | Admitting: Gastroenterology

## 2019-07-22 ENCOUNTER — Other Ambulatory Visit: Payer: Self-pay

## 2019-07-22 ENCOUNTER — Ambulatory Visit (INDEPENDENT_AMBULATORY_CARE_PROVIDER_SITE_OTHER): Payer: Medicare Other | Admitting: Student

## 2019-07-22 VITALS — BP 128/68 | HR 71 | Ht 67.0 in | Wt 196.0 lb

## 2019-07-22 DIAGNOSIS — I482 Chronic atrial fibrillation, unspecified: Secondary | ICD-10-CM | POA: Diagnosis not present

## 2019-07-22 DIAGNOSIS — I272 Pulmonary hypertension, unspecified: Secondary | ICD-10-CM

## 2019-07-22 DIAGNOSIS — Z01812 Encounter for preprocedural laboratory examination: Secondary | ICD-10-CM | POA: Diagnosis not present

## 2019-07-22 DIAGNOSIS — R0600 Dyspnea, unspecified: Secondary | ICD-10-CM

## 2019-07-22 DIAGNOSIS — Z20822 Contact with and (suspected) exposure to covid-19: Secondary | ICD-10-CM | POA: Insufficient documentation

## 2019-07-22 LAB — CUP PACEART INCLINIC DEVICE CHECK
Battery Remaining Longevity: 146 mo
Battery Voltage: 3.2 V
Brady Statistic AP VP Percent: 0 %
Brady Statistic AP VS Percent: 0 %
Brady Statistic AS VP Percent: 99.95 %
Brady Statistic AS VS Percent: 0.05 %
Brady Statistic RA Percent Paced: 0 %
Brady Statistic RV Percent Paced: 99.95 %
Date Time Interrogation Session: 20210601142049
Implantable Lead Implant Date: 20210430
Implantable Lead Location: 753860
Implantable Lead Model: 3830
Implantable Pulse Generator Implant Date: 20210430
Lead Channel Impedance Value: 3382 Ohm
Lead Channel Impedance Value: 3382 Ohm
Lead Channel Impedance Value: 456 Ohm
Lead Channel Impedance Value: 646 Ohm
Lead Channel Pacing Threshold Amplitude: 0.5 V
Lead Channel Pacing Threshold Pulse Width: 0.4 ms
Lead Channel Sensing Intrinsic Amplitude: 6 mV
Lead Channel Setting Pacing Amplitude: 3.5 V
Lead Channel Setting Pacing Pulse Width: 0.4 ms
Lead Channel Setting Sensing Sensitivity: 4 mV

## 2019-07-22 LAB — SARS CORONAVIRUS 2 (TAT 6-24 HRS): SARS Coronavirus 2: NEGATIVE

## 2019-07-22 NOTE — Progress Notes (Signed)
Electrophysiology Office Note Date: 07/22/2019  ID:  Jillian Eaton, DOB [birth year], MRN [id number]  PCP: [name], MD Primary Cardiologist: Pixie Casino, MD Electrophysiologist: Cristopher Peru, MD   CC: Pacemaker follow-up  Jillian Eaton is a 69 y.o. female seen today for Cristopher Peru, MD for routine electrophysiology followup.  Since last being seen in our clinic the patient reports doing well. She continues to have DOE, Dr. [name] is following for same and has recommended L/RHC sometime after her upcoming EGD.   Device History: Medtronic Single Chamber PPM implanted 06/20/2019 for uncontrolled AF and AV nodal ablation  Past Medical History:  Diagnosis Date  . Anemia    iron and pernicious  . Arthritis   . Asthma    flare up last yr  . CHF (congestive heart failure) ([place])   . Chronic headache    " better now "   . Complication of anesthesia    BP dropped   . DOE (dyspnea on exertion)    2D ECHO, 02/12/2012 - EF 60-65%, moderate concentric hypertrophy  . Dysrhythmia   . Fibromyalgia    nerve pain"left side at waist level" "can't lay on that side without pain" , "HOB elevation helps"; pt. thinks this has resolved after 200lb weight loss9-17- since i lost the wieght , i dont  think i have this anymore   . Heart murmur   . Hematuria - cause not known    resolved   . History of kidney stones    x 2 '13, '14 surgery to remove  . Hypertension   . Pneumonia    aspiration  04/2016  . SBO (small bowel obstruction) ([place])    rqueired admission 2020  . Thyroid disease    "goiter"  . Transfusion history    10 yrs+   Past Surgical History:  Procedure Laterality Date  . ABDOMINAL ADHESION SURGERY  2004   open LOA - in [place]  . AV NODE ABLATION N/A 06/20/2019   Procedure: AV NODE ABLATION;  Surgeon: Evans Lance, MD;  Location: [facility];  Service: Cardiovascular;  Laterality: N/A;  . CARDIAC CATHETERIZATION  04/04/2010   No significant  obstructive coronary artery disease  . CARDIOVERSION N/A 08/08/2017   Procedure: CARDIOVERSION;  Surgeon: Pixie Casino, MD;  Location: [facility];  Service: Cardiovascular;  Laterality: N/A;  . CARDIOVERSION N/A 06/18/2019   Procedure: CARDIOVERSION;  Surgeon: Pixie Casino, MD;  Location: [facility];  Service: Cardiovascular;  Laterality: N/A;  . CHOLECYSTECTOMY  1990  . COLONOSCOPY W/ POLYPECTOMY    . COLONOSCOPY WITH PROPOFOL N/A 04/10/2014   Procedure: COLONOSCOPY WITH PROPOFOL;  Surgeon: Beryle Beams, MD;  Location: [facility];  Service: Endoscopy;  Laterality: N/A;  . EYE SURGERY     lasik 20-25 yrs ago  . GASTROPLASTY VERTICAL BANDED  1985  . [place]   In [place] for SBO  . PACEMAKER IMPLANT N/A 06/20/2019   Procedure: PACEMAKER IMPLANT;  Surgeon: Evans Lance, MD;  Location: [facility];  Service: Cardiovascular;  Laterality: N/A;  . REVERSE SHOULDER ARTHROPLASTY Right 03/29/2018   Procedure: REVERSE SHOULDER ARTHROPLASTY;  Surgeon: Netta Cedars, MD;  Location: [facility];  Service: Orthopedics;  Laterality: Right;  . ROUX-EN-Y GASTRIC BYPASS  1992   Conversion VBG to RnYGB in [place], [place]  . SHOULDER ARTHROSCOPY WITH SUBACROMIAL DECOMPRESSION  2016   Dr Jannett Celestine, [place]  . SMALL INTESTINE SURGERY  2000   SBO - LOA w SB resection  . TOTAL KNEE ARTHROPLASTY Left 11/14/2018   Procedure: TOTAL KNEE ARTHROPLASTY;  Surgeon: Paralee Cancel, MD;  Location: [facility];  Service: Orthopedics;  Laterality: Left;  70 mins  . TOTAL KNEE ARTHROPLASTY Right 04/29/2019   Procedure: TOTAL KNEE ARTHROPLASTY;  Surgeon: Paralee Cancel, MD;  Location: [facility];  Service: Orthopedics;  Laterality: Right;  70 mins  . TOTAL THYROIDECTOMY  06/26/2012   Dr Celine Ahr, [facility]  . TUBAL LIGATION  1986  . URETHRAL DILATION  2012   w cystoscopy.  Dr Gaynelle Arabian  . UTERINE FIBROID EMBOLIZATION      Current Outpatient Medications  Medication Sig  Dispense Refill  . acetaminophen (TYLENOL) 500 MG tablet Take 2 tablets (1,000 mg total) by mouth every 8 (eight) hours. 30 tablet 0  . albuterol (VENTOLIN HFA) 108 (90 Base) MCG/ACT inhaler Inhale 2 puffs into the lungs every 6 (six) hours as needed for wheezing or shortness of breath.    Marland Kitchen apixaban (ELIQUIS) 5 MG TABS tablet Take 1 tablet (5 mg total) by mouth 2 (two) times daily. Please resume eliquis on 5/5 pm, 30 tablet 0  . calcitRIOL (ROCALTROL) 0.25 MCG capsule Take 1 capsule (0.25 mcg total) by mouth daily. 90 capsule 3  . calcium carbonate (TUMS - DOSED IN MG ELEMENTAL CALCIUM) 500 MG chewable tablet Chew 2 tablets by mouth every evening.    . cephALEXin (KEFLEX) 500 MG capsule Take 500 mg by mouth 2 (two) times daily.    . clotrimazole (LOTRIMIN) 1 % cream APPLY TO AFFECTED AREA TWICE A DAY 45 g 0  . cyanocobalamin (,VITAMIN B-12,) 1000 MCG/ML injection Inject 1 mL (1,000 mcg total) into the muscle every 30 (thirty) days. 1 mL 0  . cyclobenzaprine (FLEXERIL) 10 MG tablet Take 10 mg by mouth daily.    . DULoxetine (CYMBALTA) 60 MG capsule Take 1 capsule (60 mg total) by mouth daily. 30 capsule 0  . eszopiclone (LUNESTA) 2 MG TABS tablet Take 1 tablet (2 mg total) by mouth at bedtime. 20 tablet 0  . Fluticasone Furoate (ARNUITY ELLIPTA) 100 MCG/ACT AEPB Inhale 1 puff into the lungs daily. 30 each 2  . FOLIC ACID PO Take 1 tablet by mouth daily.    . furosemide (LASIX) 20 MG tablet Take 1 tablet (20 mg total) by mouth as needed. 90 tablet 2  . levETIRAcetam (KEPPRA) 500 MG tablet Take 500 mg by mouth daily.    Marland Kitchen levothyroxine (SYNTHROID) 137 MCG tablet Take 1 tablet (137 mcg total) by mouth daily before breakfast. 45 tablet 3  . magnesium oxide (MAG-OX) 400 (241.3 Mg) MG tablet Take 400 mg by mouth daily.     . NONFORMULARY OR COMPOUNDED ITEM [facility]:  Antifungal Cream - Terbinafine 3%, Fluconazole 2%, Tea Tree Oil 5%, Urea 10%, Ibuprofen 2% in DMSO Suspension #53ml. Apply to  affected toenail(s) once daily (at bedtime) or twice daily. 30 each 11  . traZODone (DESYREL) 50 MG tablet Take 1 tablet (50 mg total) by mouth at bedtime. 30 tablet 0  . Vitamin D, Ergocalciferol, (DRISDOL) 1.25 MG (50000 UT) CAPS capsule Take 1 capsule (50,000 Units total) by mouth every Wednesday. 4 capsule 0   No current facility-administered medications for this visit.    Allergies:   Hydrocodone   Social History: Social History   Socioeconomic History  . Marital status: Married    Spouse name: Not on file  . Number of children: 3  .  Years of education: Not on file  . Highest education level: Master's degree (e.g., MA, MS, MEng, MEd, MSW, MBA)  Occupational History  . Occupation: retired  Tobacco Use  . Smoking status: Never Smoker  . Smokeless tobacco: Never Used  Substance and Sexual Activity  . Alcohol use: No    Comment: wine occ  . Drug use: No    Types: Oxycodone    Comment: perscribed  . Sexual activity: Not Currently    Birth control/protection: None  Other Topics Concern  . Not on file  Social History Narrative   Right handed   1 cup of caffeine per day        Social Determinants of Health   Financial Resource Strain:   . Difficulty of Paying Living Expenses:   Food Insecurity:   . Worried About Charity fundraiser in the Last Year:   . Arboriculturist in the Last Year:   Transportation Needs:   . Film/video editor (Medical):   Marland Kitchen Lack of Transportation (Non-Medical):   Physical Activity:   . Days of Exercise per Week:   . Minutes of Exercise per Session:   Stress:   . Feeling of Stress :   Social Connections:   . Frequency of Communication with Friends and Family:   . Frequency of Social Gatherings with Friends and Family:   . Attends Religious Services:   . Active Member of Clubs or Organizations:   . Attends Archivist Meetings:   Marland Kitchen Marital Status:   Intimate Partner Violence:   . Fear of Current or Ex-Partner:   .  Emotionally Abused:   Marland Kitchen Physically Abused:   . Sexually Abused:     Family History: Family History  Problem Relation Age of Onset  . Diabetes Mother   . Epilepsy Mother   . Cancer Mother        Breast  . Hypertension Mother   . Breast cancer Mother   . Seizures Mother   . Kidney disease Father   . Diabetes Father   . Hypertension Father   . Asthma Father   . Heart disease Father   . Epilepsy Sister   . Cancer Maternal Grandmother   . Breast cancer Maternal Grandmother   . Cancer Paternal Grandmother   . Breast cancer Paternal Grandmother      Review of Systems: All other systems reviewed and are otherwise negative except as noted above.  Physical Exam: Vitals:   07/22/19 1211  BP: 128/68  Pulse: 71  SpO2: 96%  Weight: 196 lb (88.9 kg)  Height: 5\' 7"  (1.702 m)     GEN- The patient is well appearing, alert and oriented x 3 today.   HEENT: normocephalic, atraumatic; sclera clear, conjunctiva pink; hearing intact; oropharynx clear; neck supple  Lungs- Clear to ausculation bilaterally, normal work of breathing.  No wheezes, rales, rhonchi Heart- Regular rate and rhythm, no murmurs, rubs or gallops  GI- soft, non-tender, non-distended, bowel sounds present  Extremities- no clubbing, cyanosis, or edema  MS- no significant deformity or atrophy Skin- warm and dry, no rash or lesion; PPM pocket well healed Psych- euthymic mood, full affect Neuro- strength and sensation are intact  PPM Interrogation- reviewed in detail today,  See PACEART report  EKG:  EKG is not ordered today.  Recent Labs: 06/13/2019: Pro B Natriuretic peptide (BNP) 251.0 06/14/2019: B Natriuretic Peptide 141.0 06/16/2019: ALT 9 06/19/2019: Magnesium 2.0 06/21/2019: BUN 15; Creatinine, Ser 1.21; Hemoglobin 9.2; Platelets  220; Potassium 4.0; Sodium 139 07/08/2019: TSH 0.13   Wt Readings from Last 3 Encounters:  07/22/19 196 lb (88.9 kg)  07/11/19 201 lb (91.2 kg)  07/09/19 200 lb 12.8 oz (91.1 kg)      Other studies Reviewed: Additional studies/ records that were reviewed today include: Previous EP office notes, Previous remote checks, Most recent labwork.   Assessment and Plan:  1. Uncontrolled AF s/p AV nodal ablation s/p Medtronic PPM  Normal PPM function See Pace Art report No changes today Continue eliquis for CHA2DS2VASC of at least 6    2. DOE Dr. [name] following  Echo 06/15/2019 LVEF 60-65% with unclear diastolic picture and at least moderate pulmonary hypertension  3. Obesity Body mass index is 30.7 kg/m.  She has lost ~200 lbs in the past 2 years.  Current medicines are reviewed at length with the patient today.   The patient does not have concerns regarding her medicines.  The following changes were made today:  none  Labs/ tests ordered today include:  Orders Placed This Encounter  Procedures  . CUP PACEART INCLINIC DEVICE CHECK    Disposition:   Follow up with Dr. Lovena Le as scheduled 8/4.   Signed, Shirley Friar, PA-C  07/22/2019 2:26 PM  [address] [place] [id number] [phone] (office) [phone] (fax)

## 2019-07-22 NOTE — Patient Instructions (Addendum)
Medication Instructions:  none *If you need a refill on your cardiac medications before your next appointment, please call your pharmacy*   Lab Work: none If you have labs (blood work) drawn today and your tests are completely normal, you will receive your results only by:  [place] (if you have MyChart) OR  A paper copy in the mail If you have any lab test that is abnormal or we need to change your treatment, we will call you to review the results.   Testing/Procedures: none   Follow-Up: At [facility], you and your health needs are our priority.  As part of our continuing mission to provide you with exceptional heart care, we have created designated Provider Care Teams.  These Care Teams include your primary Cardiologist (physician) and Advanced Practice Providers (APPs -  Physician Assistants and Nurse Practitioners) who all work together to provide you with the care you need, when you need it.    Other Instructions Remote monitoring is used to monitor your Pacemaker from home. This monitoring reduces the number of office visits required to check your device to one time per year. It allows [place] to keep an eye on the functioning of your device to ensure it is working properly. You are scheduled for a device check from home on 09/19/19. You may send your transmission at any time that day. If you have a wireless device, the transmission will be sent automatically. After your physician reviews your transmission, you will receive a postcard with your next transmission date.

## 2019-07-23 DIAGNOSIS — M25561 Pain in right knee: Secondary | ICD-10-CM | POA: Diagnosis not present

## 2019-07-24 ENCOUNTER — Encounter (HOSPITAL_COMMUNITY): Payer: Self-pay | Admitting: Gastroenterology

## 2019-07-24 NOTE — Anesthesia Preprocedure Evaluation (Addendum)
Anesthesia Evaluation  Patient identified by MRN, date of birth, ID band Patient awake    Reviewed: Allergy & Precautions, NPO status , Patient's Chart, lab work & pertinent test results  Airway Mallampati: I  TM Distance: >3 FB Neck ROM: Full    Dental  (+) Upper Dentures, Dental Advisory Given   Pulmonary asthma ,    breath sounds clear to auscultation       Cardiovascular hypertension, +CHF  + dysrhythmias + Valvular Problems/Murmurs  Rhythm:Regular Rate:Normal     Neuro/Psych  Headaches, Seizures -,  PSYCHIATRIC DISORDERS Depression  Neuromuscular disease    GI/Hepatic Neg liver ROS, GERD  ,  Endo/Other  Hypothyroidism   Renal/GU Renal disease     Musculoskeletal  (+) Arthritis , Fibromyalgia -  Abdominal Normal abdominal exam  (+)   Peds  Hematology   Anesthesia Other Findings   Reproductive/Obstetrics                            Anesthesia Physical Anesthesia Plan  ASA: III  Anesthesia Plan: MAC   Post-op Pain Management:    Induction: Intravenous  PONV Risk Score and Plan: 0 and Propofol infusion  Airway Management Planned: Natural Airway and Simple Face Mask  Additional Equipment: None  Intra-op Plan:   Post-operative Plan:   Informed Consent: I have reviewed the patients History and Physical, chart, labs and discussed the procedure including the risks, benefits and alternatives for the proposed anesthesia with the patient or authorized representative who has indicated his/her understanding and acceptance.       Plan Discussed with: CRNA  Anesthesia Plan Comments:        Anesthesia Quick Evaluation

## 2019-07-25 ENCOUNTER — Ambulatory Visit (HOSPITAL_COMMUNITY): Payer: Medicare Other | Admitting: Certified Registered"

## 2019-07-25 ENCOUNTER — Telehealth: Payer: Self-pay | Admitting: Pulmonary Disease

## 2019-07-25 ENCOUNTER — Other Ambulatory Visit: Payer: Self-pay

## 2019-07-25 ENCOUNTER — Encounter (HOSPITAL_COMMUNITY): Payer: Self-pay | Admitting: Gastroenterology

## 2019-07-25 ENCOUNTER — Ambulatory Visit (HOSPITAL_COMMUNITY)
Admission: RE | Admit: 2019-07-25 | Discharge: 2019-07-25 | Disposition: A | Discharge status: Home / Self Care | Payer: Medicare Other | Attending: Gastroenterology | Admitting: Gastroenterology

## 2019-07-25 ENCOUNTER — Encounter (HOSPITAL_COMMUNITY)
Admission: RE | Disposition: A | Discharge status: Home / Self Care | Payer: Self-pay | Source: Home / Self Care | Attending: Gastroenterology

## 2019-07-25 DIAGNOSIS — N183 Chronic kidney disease, stage 3 unspecified: Secondary | ICD-10-CM | POA: Diagnosis not present

## 2019-07-25 DIAGNOSIS — I509 Heart failure, unspecified: Secondary | ICD-10-CM | POA: Diagnosis not present

## 2019-07-25 DIAGNOSIS — Z96653 Presence of artificial knee joint, bilateral: Secondary | ICD-10-CM | POA: Diagnosis not present

## 2019-07-25 DIAGNOSIS — I110 Hypertensive heart disease with heart failure: Secondary | ICD-10-CM | POA: Insufficient documentation

## 2019-07-25 DIAGNOSIS — E890 Postprocedural hypothyroidism: Secondary | ICD-10-CM | POA: Insufficient documentation

## 2019-07-25 DIAGNOSIS — I5031 Acute diastolic (congestive) heart failure: Secondary | ICD-10-CM | POA: Diagnosis not present

## 2019-07-25 DIAGNOSIS — Z950 Presence of cardiac pacemaker: Secondary | ICD-10-CM | POA: Diagnosis not present

## 2019-07-25 DIAGNOSIS — Z885 Allergy status to narcotic agent status: Secondary | ICD-10-CM | POA: Diagnosis not present

## 2019-07-25 DIAGNOSIS — Z980 Intestinal bypass and anastomosis status: Secondary | ICD-10-CM | POA: Insufficient documentation

## 2019-07-25 DIAGNOSIS — Z96611 Presence of right artificial shoulder joint: Secondary | ICD-10-CM | POA: Insufficient documentation

## 2019-07-25 DIAGNOSIS — J45909 Unspecified asthma, uncomplicated: Secondary | ICD-10-CM | POA: Diagnosis not present

## 2019-07-25 DIAGNOSIS — R1310 Dysphagia, unspecified: Secondary | ICD-10-CM | POA: Diagnosis not present

## 2019-07-25 DIAGNOSIS — M797 Fibromyalgia: Secondary | ICD-10-CM | POA: Diagnosis not present

## 2019-07-25 DIAGNOSIS — M1990 Unspecified osteoarthritis, unspecified site: Secondary | ICD-10-CM | POA: Diagnosis not present

## 2019-07-25 DIAGNOSIS — I130 Hypertensive heart and chronic kidney disease with heart failure and stage 1 through stage 4 chronic kidney disease, or unspecified chronic kidney disease: Secondary | ICD-10-CM | POA: Diagnosis not present

## 2019-07-25 DIAGNOSIS — K219 Gastro-esophageal reflux disease without esophagitis: Secondary | ICD-10-CM | POA: Diagnosis not present

## 2019-07-25 HISTORY — PX: BALLOON DILATION: SHX5330

## 2019-07-25 HISTORY — PX: ESOPHAGOGASTRODUODENOSCOPY (EGD) WITH PROPOFOL: SHX5813

## 2019-07-25 HISTORY — PX: MALONEY DILATION: SHX5535

## 2019-07-25 SURGERY — ESOPHAGOGASTRODUODENOSCOPY (EGD) WITH PROPOFOL
Anesthesia: Monitor Anesthesia Care

## 2019-07-25 MED ORDER — PROPOFOL 10 MG/ML IV BOLUS
INTRAVENOUS | Status: DC | PRN
  Administered 2019-07-25: 20 mg via INTRAVENOUS

## 2019-07-25 MED ORDER — PROPOFOL 500 MG/50ML IV EMUL
INTRAVENOUS | Status: DC | PRN
  Administered 2019-07-25: 125 ug/kg/min via INTRAVENOUS

## 2019-07-25 MED ORDER — LIDOCAINE 2% (20 MG/ML) 5 ML SYRINGE
INTRAMUSCULAR | Status: DC | PRN
  Administered 2019-07-25: 60 mg via INTRAVENOUS

## 2019-07-25 MED ORDER — PROPOFOL 500 MG/50ML IV EMUL
INTRAVENOUS | Status: AC
  Filled 2019-07-25: qty 50

## 2019-07-25 MED ORDER — LACTATED RINGERS IV SOLN: INTRAVENOUS | Status: DC

## 2019-07-25 MED ORDER — PROPOFOL 10 MG/ML IV BOLUS
INTRAVENOUS | Status: AC
  Filled 2019-07-25: qty 20

## 2019-07-25 SURGICAL SUPPLY — 14 items

## 2019-07-25 NOTE — Op Note (Signed)
[facility] Patient Name: Jillian Eaton Procedure Date: 07/25/2019 MRN: [id number] Attending MD: Carol Ada , MD Date of Birth: 1950/06/19 CSN: [id number] Age: 69 Admit Type: Outpatient Procedure:                Upper GI endoscopy Indications:              Dysphagia Providers:                Carol Ada, MD, Cleda Daub, RN, Laverda Sorenson,                            Technician, Jefm Miles CRNA Referring MD:              Medicines:                Propofol per Anesthesia Complications:            No immediate complications. Estimated Blood Loss:     Estimated blood loss: none. Procedure:                Pre-Anesthesia Assessment:                           - Prior to the procedure, a History and Physical                            was performed, and patient medications and                            allergies were reviewed. The patient's tolerance of                            previous anesthesia was also reviewed. The risks                            and benefits of the procedure and the sedation                            options and risks were discussed with the patient.                            All questions were answered, and informed consent                            was obtained. Prior Anticoagulants: The patient has                            taken Eliquis (apixaban), last dose was 2 days                            prior to procedure. ASA Grade Assessment: III - A                            patient with severe systemic disease. After  reviewing the risks and benefits, the patient was                            deemed in satisfactory condition to undergo the                            procedure.                           - Sedation was administered by an anesthesia                            professional. Deep sedation was attained.                           After obtaining informed consent, the endoscope was           passed under direct vision. Throughout the                            procedure, the patient's blood pressure, pulse, and                            oxygen saturations were monitored continuously. The                            GIF-H190 ([id number] Olympus gastroscope was                            introduced through the mouth, and advanced to the                            proximal jejunum. The upper GI endoscopy was                            accomplished without difficulty. The patient                            tolerated the procedure well. Scope In: Scope Out: Findings:      No endoscopic abnormality was evident in the esophagus to explain the       patient's complaint of dysphagia. It was decided, however, to proceed       with dilation of the middle third of the esophagus and of the lower       third of the esophagus. A TTS dilator was passed through the scope.       Dilation with an 18-19-20 mm balloon dilator was performed to 20 mm. The       dilation site was examined following endoscope reinsertion and showed no       change.      Evidence of a Roux-en-Y gastrojejunostomy was found. The gastrojejunal       anastomosis was characterized by healthy appearing mucosa. This was       traversed. The pouch-to-jejunum limb was characterized by healthy       appearing mucosa.      The examined jejunum was normal.      Gross inspection of  the esopahgus was widely patent. A ballooon       pull-through was performed in the distal and middle esophagus at 18 and       then 20 mm. The balloon moved freely at both diameters. The UES was       dilated with a 54 Fr Maloney. Reinspection of the area was negative for       any evidence of mucosal breaks. No resistance was encountered. In the 3       cm gastric pouch, there was some evidence of retained gastric contents       and some mild refluxate in the esophagus. Impression:               - No endoscopic esophageal abnormality to  explain                            patient's dysphagia. Esophagus dilated. Dilated.                           - Roux-en-Y gastrojejunostomy with gastrojejunal                            anastomosis characterized by healthy appearing                            mucosa.                           - Normal examined jejunum.                           - No specimens collected. Moderate Sedation:      Not Applicable - Patient had care per Anesthesia. Recommendation:           - Patient has a contact number available for                            emergencies. The signs and symptoms of potential                            delayed complications were discussed with the                            patient. Return to normal activities tomorrow.                            Written discharge instructions were provided to the                            patient.                           - Resume previous diet.                           - Continue present medications.                           -  Return to GI clinic in 4 weeks. Procedure Code(s):        --- Professional ---                           [phone], Esophagogastroduodenoscopy, flexible,                            transoral; with transendoscopic balloon dilation of                            esophagus (less than 30 mm diameter) Diagnosis Code(s):        --- Professional ---                           R13.10, Dysphagia, unspecified                           Z98.0, Intestinal bypass and anastomosis status CPT copyright 2019 [employer]. All rights reserved. The codes documented in this report are preliminary and upon coder review may  be revised to meet current compliance requirements. Carol Ada, MD Carol Ada, MD 07/25/2019 8:21:40 AM This report has been signed electronically. Number of Addenda: 0

## 2019-07-25 NOTE — Telephone Encounter (Signed)
ONO with RA 07/04/19 >> test time 7 hrs 30 min.  Basal SpO2 93.4%, low SpO2 52%.  Spent 18.5 min with SpO2 < 88%.  Please let her know that her overnight oxygen test shows her oxygen level is low at night.  She needs to have an in lab split night sleep study to further assess.  Please use diagnosis codes for sleep disordered breathing, chronic hypoxic and hypercapnic respiratory failure, and pulmonary hypertension when ordering the sleep study.

## 2019-07-25 NOTE — Anesthesia Postprocedure Evaluation (Signed)
Anesthesia Post Note  Patient: Jillian Eaton  Procedure(s) Performed: ESOPHAGOGASTRODUODENOSCOPY (EGD) WITH PROPOFOL (N/A ) BALLOON DILATION (N/A ) [place]     Patient location during evaluation: PACU Anesthesia Type: MAC Level of consciousness: awake and alert Pain management: pain level controlled Vital Signs Assessment: post-procedure vital signs reviewed and stable Respiratory status: spontaneous breathing, nonlabored ventilation, respiratory function stable and patient connected to nasal cannula oxygen Cardiovascular status: stable and blood pressure returned to baseline Postop Assessment: no apparent nausea or vomiting Anesthetic complications: no    Last Vitals:  Vitals:   07/25/19 0840 07/25/19 0850  BP: 111/86 122/89  Pulse: (!) 59 60  Resp: 16 17  Temp:    SpO2: 95% 100%    Last Pain:  Vitals:   07/25/19 0850  TempSrc:   PainSc: 0-No pain                 Effie Berkshire

## 2019-07-25 NOTE — Transfer of Care (Signed)
Immediate Anesthesia Transfer of Care Note  Patient: Jillian Eaton  Procedure(s) Performed: ESOPHAGOGASTRODUODENOSCOPY (EGD) WITH PROPOFOL (N/A ) BALLOON DILATION (N/A ) MALONEY DILATION  Patient Location: PACU and Endoscopy Unit  Anesthesia Type:MAC  Level of Consciousness: awake, alert  and oriented  Airway & Oxygen Therapy: Patient Spontanous Breathing and Patient connected to face mask oxygen  Post-op Assessment: Report given to RN and Post -op Vital signs reviewed and stable  Post vital signs: Reviewed and stable  Last Vitals:  Vitals Value Taken Time  BP    Temp    Pulse    Resp    SpO2      Last Pain: There were no vitals filed for this visit.       Complications: No apparent anesthesia complications

## 2019-07-25 NOTE — H&P (Signed)
Jillian Eaton HPI: The patient reports an issue with gradual solid food dysphagia over the past month. Her dysphagia is consistent with her pills and "sitcky" foods. Intermittently she will have vomiting. Anorexia was an issue, but it improved. Over the past several years she intentionally lost weight as a result of eating smaller portions. The last time she was in the office she was unable to be weighed and she was s/p gastric bypass. She was recently hospitalized for CHF and an arrythmia and a pacemaker was placed by Jillian. Lovena Eaton. Jillian. Debara Eaton follows her on a routine basis. She takes Eliquis on a daily basis.    Past Medical History:  Diagnosis Date  . Anemia    iron and pernicious  . Arthritis   . Asthma    flare up last yr  . CHF (congestive heart failure) ([place])   . Chronic headache    " better now "   . Complication of anesthesia    BP dropped   . DOE (dyspnea on exertion)    2D ECHO, 02/12/2012 - EF 60-65%, moderate concentric hypertrophy  . Dysrhythmia   . Fibromyalgia    nerve pain"left side at waist level" "can't lay on that side without pain" , "HOB elevation helps"; pt. thinks this has resolved after 200lb weight loss9-17- since i lost the wieght , i dont  think i have this anymore   . Heart murmur   . Hematuria - cause not known    resolved   . History of kidney stones    x 2 '13, '14 surgery to remove  . Hypertension   . Pneumonia    aspiration  04/2016  . SBO (small bowel obstruction) ([place])    rqueired admission 2020  . Thyroid disease    "goiter"  . Transfusion history    10 yrs+    Past Surgical History:  Procedure Laterality Date  . ABDOMINAL ADHESION SURGERY  2004   open LOA - in [place]  . AV NODE ABLATION N/A 06/20/2019   Procedure: AV NODE ABLATION;  Surgeon: Jillian Lance, MD;  Location: [facility];  Service: Cardiovascular;  Laterality: N/A;  . CARDIAC CATHETERIZATION  04/04/2010   No significant obstructive coronary artery disease   . CARDIOVERSION N/A 08/08/2017   Procedure: CARDIOVERSION;  Surgeon: Jillian Casino, MD;  Location[facility]COPY;  Service: Cardiovascular;  Laterality: N/A;  . CARDIOVERSION N/A 06/18/2019   Procedure: CARDIOVERSION;  Surgeon: Jillian Casino, MD;  Location: [facility];  Service: Cardiovascular;  Laterality: N/A;  . CHOLECYSTECTOMY  1990  . COLONOSCOPY W/ POLYPECTOMY    . COLONOSCOPY WITH PROPOFOL N/A 04/10/2014   Procedure: COLONOSCOPY WITH PROPOFOL;  Surgeon: Jillian Beams, MD;  Location: [facility] ENDOSCOPY;  Service: Endoscopy;  Laterality: N/A;  . EYE SURGERY     lasik 20-25 yrs ago  . GASTROPLASTY VERTICAL BANDED  1985  . [facility]   In [place] for SBO  . PACEMAKER IMPLANT N/A 06/20/2019   Procedure: PACEMAKER IMPLANT;  Surgeon: Jillian Lance, MD;  Location: [place] [facility];  Service: Cardiovascular;  Laterality: N/A;  . REVERSE SHOULDER ARTHROPLASTY Right 03/29/2018   Procedure: REVERSE SHOULDER ARTHROPLASTY;  Surgeon: Jillian Cedars, MD;  Location: [facility];  Service: Orthopedics;  Laterality: Right;  . ROUX-EN-Y GASTRIC BYPASS  1992   Conversion VBG to RnYGB in [place] Jillian Eaton, [place]  . SHOULDER ARTHROSCOPY WITH SUBACROMIAL DECOMPRESSION  2016   Jillian Eaton, [place], [place]  .  SMALL INTESTINE SURGERY  2000   SBO - LOA w SB resection  . TOTAL KNEE ARTHROPLASTY Left 11/14/2018   Procedure: TOTAL KNEE ARTHROPLASTY;  Surgeon: Jillian Cancel, MD;  Location: [facility];  Service: Orthopedics;  Laterality: Left;  70 mins  . TOTAL KNEE ARTHROPLASTY Right 04/29/2019   Procedure: TOTAL KNEE ARTHROPLASTY;  Surgeon: Jillian Cancel, MD;  Location: [facility];  Service: Orthopedics;  Laterality: Right;  70 mins  . TOTAL THYROIDECTOMY  06/26/2012   Jillian Jillian Eaton, [facility]  . TUBAL LIGATION  1986  . URETHRAL DILATION  2012   w cystoscopy.  Jillian Eaton  . UTERINE FIBROID EMBOLIZATION      Family History  Problem Relation Age of Onset  . Diabetes Mother   . Epilepsy  Mother   . Cancer Mother        Breast  . Hypertension Mother   . Breast cancer Mother   . Seizures Mother   . Kidney disease Father   . Diabetes Father   . Hypertension Father   . Asthma Father   . Heart disease Father   . Epilepsy Sister   . Cancer Maternal Grandmother   . Breast cancer Maternal Grandmother   . Cancer Paternal Grandmother   . Breast cancer Paternal Grandmother     Social History:  reports that she has never smoked. She has never used smokeless tobacco. She reports that she does not drink alcohol or use drugs.  Allergies:  Allergies  Allergen Reactions  . Hydrocodone Itching         Medications:  Scheduled:  Continuous: . lactated ringers 20 mL/hr at 07/25/19 0718    No results found for this or any previous visit (from the past 24 hour(s)).   No results found.  ROS:  As stated above in the HPI otherwise negative.  Height 5\' 7"  (1.702 m), weight 88.5 kg.    PE: Gen: NAD, Alert and Oriented HEENT:  Jillian Eaton/AT, EOMI Neck: Supple, no LAD Lungs: CTA Bilaterally CV: RRR without M/G/R ABD: Soft, NTND, +BS Ext: No C/C/E  Assessment/Plan: 1) Dysphagia - EGD with dilation.  Jillian Eaton D 07/25/2019, 7:49 AM

## 2019-07-25 NOTE — Discharge Instructions (Signed)

## 2019-07-25 NOTE — Anesthesia Procedure Notes (Signed)
Procedure Name: MAC Date/Time: 07/25/2019 7:54 AM Performed by: Eben Burow, CRNA Pre-anesthesia Checklist: Patient identified, Emergency Drugs available, Suction available, Patient being monitored and Timeout performed Oxygen Delivery Method: Simple face mask Dental Injury: Teeth and Oropharynx as per pre-operative assessment

## 2019-07-28 NOTE — Progress Notes (Signed)
Cardiology Office Note:    Date:  08/06/2019   ID:  Jillian Eaton, DOB 1950-05-15, MRN [id number]  PCP:  [name], MD  Cardiologist:  Pixie Casino, MD   Referring MD: [name], MD   Chief Complaint  Patient presents with  . Follow-up    preop clearance    History of Present Illness:    Jillian Eaton is a 69 y.o. female with a hx of NICM, HTN, pulmonary hypertension, chronic atrial fibrillation, chronic respiratory failure, fibromyalgia, normal coronaries by heart cath in 2012. She had an unintentional narcotic overdose resulting in aspiration PNA. EF at that time was 25% with subsequent improvement; chronic diastolic heart failure.  She had gastric bypass surgery in 1992, hx of IDA, and CKD stage III.  Afib first diagnosed in 2019, failed DCCV and amiodarone 08/2017.  She has failed amiodarone and dofetilide for Afib. She was doing well with rate control until recently. She presented to [facility] with exertional palpitations and worsening DOE. She was rate controlled with rest, but rates increased significantly with movement. She has had significant weight loss of 200 lbs. She is anticoagulated with eliquis. DCCV attempted, but failed on 06/18/19 (after 4 attempts). She then underwent tikosyn load. She continued to have RVR with ambulation and ultimately AV node ablation and PPM insertion was pursued on 06/20/19. She tolerated this well and was discharged on 06/21/19. Echo during admission with normal EF, elevated PA pressure of 50.7 mmHg, biatrial enlargement, moderate TR. I saw her in follow up on 06/26/19. She reported rapid heart rate and SOB - interrogation of her device did not show episodes of ST or Afib. She also complained of chest pressure and soreness in the center of her chest - opted to watch conservatively, low suspicion for ACS at that time. Since that appt, she saw Dr. [name] in clinic on 07/11/19. She had persistent chest discomfort and dyspnea. Dr.  [name] felt her dyspnea was likely due to her pulmonary hypertension. Dr. [name] suggested proceeding with EGD for her swallowing problems. After evaluation, can consider right and left heart cath to further evaluate her dyspnea. She underwent home O2 study which showed low overnight O2 levels and split-night study was recommended.   She presents today for follow up. In the interim, we have received preop clearance for cystoscopy and ureteroscopy. She denies chest pain - has not had an episode for three weeks. She states she started feeling better about 1 week prior to EGD. She does report mild DOE, but still exercising. She does PT for her knees and walks about 1 mile daily without angina. DOE has improved as she has increased her activity/exercise, consistent with deconditioning. Her main concern is that this is her 4th bout of kidney stones requiring intervention. She follows with Dr. Halford Chessman for pulmonology.    Past Medical History:  Diagnosis Date  . Anemia    iron and pernicious  . Arthritis   . CHF (congestive heart failure) ([place]) 2018  . Complication of anesthesia    BP dropped   . DOE (dyspnea on exertion)    2D ECHO, 02/12/2012 - EF 60-65%, moderate concentric hypertrophy  . Dyspnea   . Dysrhythmia    a-fib  . Fibromyalgia    nerve pain"left side at waist level" "can't lay on that side without pain" , "HOB elevation helps"; pt. thinks this has resolved after 200lb weight loss9-17- since i lost the wieght , i dont  think i have this  anymore   . Headache 04/2019  . Heart murmur   . Hematuria - cause not known    resolved   . History of kidney stones    x 2 '13, '14 surgery to remove  . Pneumonia    aspiration  04/2016  . Presence of permanent cardiac pacemaker   . SBO (small bowel obstruction) ([place])    rqueired admission 2020  . Thyroid disease    "goiter"  . Transfusion history    10 yrs+    Past Surgical History:  Procedure Laterality Date  . ABDOMINAL ADHESION SURGERY   2004   open LOA - in [place]  . AV NODE ABLATION N/A 06/20/2019   Procedure: AV NODE ABLATION;  Surgeon: Evans Lance, MD;  Location: [facility];  Service: Cardiovascular;  Laterality: N/A;  . BALLOON DILATION N/A 07/25/2019   Procedure: BALLOON DILATION;  Surgeon: Carol Ada, MD;  Location: [facility];  Service: Endoscopy;  Laterality: N/A;  . CARDIAC CATHETERIZATION  04/04/2010   No significant obstructive coronary artery disease  . CARDIOVERSION N/A 08/08/2017   Procedure: CARDIOVERSION;  Surgeon: Pixie Casino, MD;  Location: [facility] ENDOSCOPY;  Service: Cardiovascular;  Laterality: N/A;  . CARDIOVERSION N/A 06/18/2019   Procedure: CARDIOVERSION;  Surgeon: Pixie Casino, MD;  Location: [place];  Service: Cardiovascular;  Laterality: N/A;  . CHOLECYSTECTOMY  1990  . COLONOSCOPY W/ POLYPECTOMY    . COLONOSCOPY WITH PROPOFOL N/A 04/10/2014   Procedure: COLONOSCOPY WITH PROPOFOL;  Surgeon: Beryle Beams, MD;  [facility]: [facility];  Service: Endoscopy;  Laterality: N/A;  . ESOPHAGOGASTRODUODENOSCOPY (EGD) WITH PROPOFOL N/A 07/25/2019   Procedure: ESOPHAGOGASTRODUODENOSCOPY (EGD) WITH PROPOFOL;  Surgeon: Carol Ada, MD;  [facility];  Service: Endoscopy;  Laterality: N/A;  . EYE SURGERY     lasik 20-25 yrs ago  . GASTROPLASTY VERTICAL BANDED  1985  . [place]   In [place] for SBO  . MALONEY DILATION  07/25/2019   Procedure: Venia Minks DILATION;  Surgeon: Carol Ada, MD;  Location: [facility];  Service: Endoscopy;;  . PACEMAKER IMPLANT N/A 06/20/2019   Procedure: PACEMAKER IMPLANT;  Surgeon: Evans Lance, MD;  Location: [place] [facility];  Service: Cardiovascular;  Laterality: N/A;  . REVERSE SHOULDER ARTHROPLASTY Right 03/29/2018   Procedure: REVERSE SHOULDER ARTHROPLASTY;  Surgeon: Netta Cedars, MD;  Location: [facility];  Service: Orthopedics;  Laterality: Right;  . ROUX-EN-Y GASTRIC BYPASS  1992   Conversion VBG to RnYGB in  [place]  . SHOULDER ARTHROSCOPY WITH SUBACROMIAL DECOMPRESSION  2016   Dr Berenice Primas, [place], [place]  . SMALL INTESTINE SURGERY  2000   SBO - LOA w SB resection  . TOTAL KNEE ARTHROPLASTY Left 11/14/2018   Procedure: TOTAL KNEE ARTHROPLASTY;  Surgeon: Paralee Cancel, MD;  [facility];  Service: Orthopedics;  Laterality: Left;  70 mins  . TOTAL KNEE ARTHROPLASTY Right 04/29/2019   Procedure: TOTAL KNEE ARTHROPLASTY;  Surgeon: Paralee Cancel, MD;  [facility];  Service: Orthopedics;  Laterality: Right;  70 mins  . TOTAL THYROIDECTOMY  06/26/2012   Dr Celine Ahr, [facility]  . TUBAL LIGATION  1986  . URETHRAL DILATION  2012   w cystoscopy.  Dr Gaynelle Arabian  . UTERINE FIBROID EMBOLIZATION      Current Medications: Current Meds  Medication Sig  . acetaminophen (TYLENOL) 500 MG tablet Take 2 tablets (1,000 mg total) by mouth every 8 (eight) hours.  Marland Kitchen acetaminophen (TYLENOL) 650 MG CR  tablet Take 650 mg by mouth every 8 (eight) hours as needed for pain.  Marland Kitchen albuterol (VENTOLIN HFA) 108 (90 Base) MCG/ACT inhaler Inhale 2 puffs into the lungs every 6 (six) hours as needed for wheezing or shortness of breath.  Marland Kitchen apixaban (ELIQUIS) 5 MG TABS tablet Take 1 tablet (5 mg total) by mouth 2 (two) times daily. Please resume eliquis on 5/5 pm,  . calcitRIOL (ROCALTROL) 0.25 MCG capsule Take 1 capsule (0.25 mcg total) by mouth daily.  . clotrimazole (LOTRIMIN) 1 % cream APPLY TO AFFECTED AREA TWICE A DAY (Patient taking differently: Apply 1 application topically 2 (two) times daily as needed (affected area(s) of feet). )  . cyanocobalamin (,VITAMIN B-12,) 1000 MCG/ML injection Inject 1 mL (1,000 mcg total) into the muscle every 30 (thirty) days.  . cyclobenzaprine (FLEXERIL) 10 MG tablet Take 20 mg by mouth at bedtime.   . DULoxetine (CYMBALTA) 60 MG capsule Take 1 capsule (60 mg total) by mouth daily.  . eszopiclone (LUNESTA) 2 MG TABS tablet Take 1 tablet (2 mg total) by mouth at bedtime.  .  Fluticasone Furoate (ARNUITY ELLIPTA) 100 MCG/ACT AEPB Inhale 1 puff into the lungs daily.  Marland Kitchen FOLIC ACID PO Take 1 tablet by mouth daily.  . furosemide (LASIX) 20 MG tablet Take 1 tablet (20 mg total) by mouth as needed. (Patient taking differently: Take 20 mg by mouth daily as needed (fluid retention). )  . levETIRAcetam (KEPPRA) 500 MG tablet Take 500 mg by mouth daily.  Marland Kitchen levothyroxine (SYNTHROID) 137 MCG tablet Take 1 tablet (137 mcg total) by mouth daily before breakfast.  . magnesium oxide (MAG-OX) 400 (241.3 Mg) MG tablet Take 400 mg by mouth daily.   . NONFORMULARY OR COMPOUNDED ITEM [facility]:  Antifungal Cream - Terbinafine 3%, Fluconazole 2%, Tea Tree Oil 5%, Urea 10%, Ibuprofen 2% in DMSO Suspension #3ml. Apply to affected toenail(s) once daily (at bedtime) or twice daily. (Patient taking differently: Apply 1 application topically 2 (two) times daily as needed (affected area(s) of feet.). [facility]:  Antifungal Cream - Terbinafine 3%, Fluconazole 2%, Tea Tree Oil 5%, Urea 10%, Ibuprofen 2% in DMSO Suspension #60ml. Apply to affected toenail(s) once daily (at bedtime) or twice daily.)  . traZODone (DESYREL) 50 MG tablet Take 1 tablet (50 mg total) by mouth at bedtime.  . Vitamin D, Ergocalciferol, (DRISDOL) 1.25 MG (50000 UT) CAPS capsule Take 1 capsule (50,000 Units total) by mouth every Wednesday.     Allergies:   Hydrocodone   Social History   Socioeconomic History  . Marital status: Married    Spouse name: Not on file  . Number of children: 3  . Years of education: Not on file  . Highest education level: Master's degree (e.g., MA, MS, MEng, MEd, MSW, MBA)  Occupational History  . Occupation: retired  Tobacco Use  . Smoking status: Never Smoker  . Smokeless tobacco: Never Used  Vaping Use  . Vaping Use: Never used  Substance and Sexual Activity  . Alcohol use: No    Comment: wine occ  . Drug use: No    Types: Oxycodone    Comment: perscribed  .  Sexual activity: Not Currently    Birth control/protection: None  Other Topics Concern  . Not on file  Social History Narrative   Right handed   1 cup of caffeine per day        Social Determinants of Health   Financial Resource Strain:   . Difficulty of Paying  Living Expenses:   Food Insecurity:   . Worried About Charity fundraiser in the Last Year:   . Arboriculturist in the Last Year:   Transportation Needs:   . Film/video editor (Medical):   Marland Kitchen Lack of Transportation (Non-Medical):   Physical Activity:   . Days of Exercise per Week:   . Minutes of Exercise per Session:   Stress:   . Feeling of Stress :   Social Connections:   . Frequency of Communication with Friends and Family:   . Frequency of Social Gatherings with Friends and Family:   . Attends Religious Services:   . Active Member of Clubs or Organizations:   . Attends Archivist Meetings:   Marland Kitchen Marital Status:      Family History: The patient's family history includes Asthma in her father; Breast cancer in her maternal grandmother, mother, and paternal grandmother; Cancer in her maternal grandmother, mother, and paternal grandmother; Diabetes in her father and mother; Epilepsy in her mother and sister; Heart disease in her father; Hypertension in her father and mother; Kidney disease in her father; Seizures in her mother.  ROS:   Please see the history of present illness.     All other systems reviewed and are negative.  EKGs/Labs/Other Studies Reviewed:    The following studies were reviewed today:  Echo 06/15/19: 1. Left ventricular ejection fraction, by estimation, is 60 to 65%. The  left ventricle has normal function. The left ventricle has no regional  wall motion abnormalities. Left ventricular diastolic parameters are  indeterminate.  2. Right ventricular systolic function is normal. The right ventricular  size is mildly enlarged. There is moderately elevated pulmonary artery    systolic pressure. The estimated right ventricular systolic pressure is  99.3 mmHg.  3. Left atrial size was moderately dilated.  4. Right atrial size was moderately dilated.  5. The mitral valve is normal in structure. Mild mitral valve  regurgitation. No evidence of mitral stenosis.  6. Tricuspid valve regurgitation is moderate.  7. The aortic valve is tricuspid. Aortic valve regurgitation is trivial.  Mild aortic valve sclerosis is present, with no evidence of aortic valve  stenosis.  8. The inferior vena cava is normal in size with greater than 50%  respiratory variability, suggesting right atrial pressure of 3 mmHg.   EKG:  EKG is  ordered today.  The ekg ordered today demonstrates paced rhythm  Recent Labs: 06/13/2019: Pro B Natriuretic peptide (BNP) 251.0 06/14/2019: B Natriuretic Peptide 141.0 06/16/2019: ALT 9 06/19/2019: Magnesium 2.0 06/21/2019: BUN 15; Creatinine, Ser 1.21; Hemoglobin 9.2; Platelets 220; Potassium 4.0; Sodium 139 07/08/2019: TSH 0.13  Recent Lipid Panel    Component Value Date/Time   CHOL 150 05/27/2018 0543   TRIG 50 05/27/2018 0543   HDL 62 05/27/2018 0543   CHOLHDL 2.4 05/27/2018 0543   VLDL 10 05/27/2018 0543   LDLCALC 78 05/27/2018 0543    Physical Exam:    VS:  BP 126/80   Pulse 74   Temp 98.1 F (36.7 C)   Ht 5\' 7"  (1.702 m)   Wt 198 lb (89.8 kg)   SpO2 99%   BMI 31.01 kg/m     Wt Readings from Last 3 Encounters:  08/06/19 198 lb (89.8 kg)  07/18/19 195 lb (88.5 kg)  07/22/19 196 lb (88.9 kg)     GEN:  Well nourished, well developed in no acute distress HEENT: Normal NECK: No JVD; No carotid bruits LYMPHATICS: No  lymphadenopathy CARDIAC: RRR, no murmurs, rubs, gallops RESPIRATORY:  Clear to auscultation without rales, wheezing or rhonchi  ABDOMEN: Soft, non-tender, non-distended MUSCULOSKELETAL:  No edema; No deformity  SKIN: Warm and dry NEUROLOGIC:  Alert and oriented x 3 PSYCHIATRIC:  Normal affect   ASSESSMENT:     1. Essential hypertension   2. Chronic diastolic CHF (congestive heart failure) ([place])   3. Atrial fibrillation with RVR ([place])   4. NICM (nonischemic cardiomyopathy) ([place])   5. Preoperative cardiovascular examination    PLAN:    In order of problems listed above:  Preoperative clearance She can complete more than 4.0 METS without angina. Her symptoms improved significantly after EGD. DOE is improving with her increase in activity, may be related to deconditioning. Chest pain has not recurred for the last three weeks. According to the RCRI, she has a 0.9% risk of cardiac complications.  We discussed that she did have chest pain prior to her EDG concerning for angina, but this improved following esophageal dilation. She understands that she is at a slightly higher risk given her prior symptoms without definitive angiography. However, given her overall clinical picture today, I do not think further testing is warranted prior to surgery.  She wishes to proceed. I will send my note to Dr. Lovena Neighbours.    Per our clinical pharmacist: Patient with diagnosis of afib on Eliquis for anticoagulation.    Procedure: cystoscopy Date of procedure: TBD  CHADS2-VASc score of 4 (age, sex, CHF, HTN)  CrCl 23mL/min using adjusted body weight Platelet count 220K  Per office protocol, patient can hold Eliquis for 2 days prior to procedure.     Atrial fibrillation with RVR s/p AV node ablation with PPM insertion - continue eliquis   Pulmonary hypertension - right ventricular systolic pressure was 51.8 mm Hg  - following with Dr. Halford Chessman   CKD stage III - sCr at discharge 1.21   Obesity S/p gastric bypass surgery - has had a 200 lbs weight loss   OSA - low O2 overnight, but she does not want to do a another split night study - followed by Dr. Halford Chessman   Solid food dysphagia - EGD with dilation on 07/25/19 - no further chest pain   Follow up with Dr. [name] as scheduled.   Medication  Adjustments/Labs and Tests Ordered: Current medicines are reviewed at length with the patient today.  Concerns regarding medicines are outlined above.  Orders Placed This Encounter  Procedures  . EKG 12-Lead   No orders of the defined types were placed in this encounter.   Signed, Ledora Bottcher, PA  08/06/2019 2:06 PM    [facility]

## 2019-07-29 DIAGNOSIS — M25561 Pain in right knee: Secondary | ICD-10-CM | POA: Diagnosis not present

## 2019-07-29 NOTE — Telephone Encounter (Signed)
Sleep supplements and lunesta would not explain her low oxygen.  Please schedule an ROV with me or NP to discuss need for in lab sleep study in more detail.

## 2019-07-29 NOTE — Telephone Encounter (Signed)
Left message for patient to call back  

## 2019-07-29 NOTE — Telephone Encounter (Signed)
Spoke with patient. She verbalized understanding. She stated that she is not interested in any in-lab sleep studies at the moment. She has had at least 5 in the past few years.   She also stated that she takes OTC supplements as well as Lunesta to help her sleep. Therefore she is a deep sleeper. She wonders if the sleep supplements and Lunesta played a role in her O2 levels dropping.   VS, please advise. Thanks!

## 2019-07-30 ENCOUNTER — Ambulatory Visit: Payer: Medicare Other | Admitting: Internal Medicine

## 2019-07-31 DIAGNOSIS — M25561 Pain in right knee: Secondary | ICD-10-CM | POA: Diagnosis not present

## 2019-08-01 ENCOUNTER — Other Ambulatory Visit: Payer: Self-pay | Admitting: Urology

## 2019-08-01 NOTE — Telephone Encounter (Signed)
Received summary of benefits, and it states that patient does not require PA for Prolia. Patient does have a deductible of $203 and she has met this. Patient does not have an OOP cost for medication. Medication is covered at 100% but pt is responsible for 20% of administration cost, however, patients secondary insurance picks up and pays anything primary insurance does not cover. Therefore, patient does not owe anything at all for medication or injection.   Patient can be scheduled anytime.  Called pt and she is scheduled for 08/05/19 at 1100

## 2019-08-05 ENCOUNTER — Ambulatory Visit (INDEPENDENT_AMBULATORY_CARE_PROVIDER_SITE_OTHER): Payer: Medicare Other

## 2019-08-05 ENCOUNTER — Other Ambulatory Visit: Payer: Self-pay

## 2019-08-05 DIAGNOSIS — M25561 Pain in right knee: Secondary | ICD-10-CM | POA: Diagnosis not present

## 2019-08-05 DIAGNOSIS — M810 Age-related osteoporosis without current pathological fracture: Secondary | ICD-10-CM

## 2019-08-05 MED ORDER — DENOSUMAB 60 MG/ML ~~LOC~~ SOSY
60.0000 mg | PREFILLED_SYRINGE | Freq: Once | SUBCUTANEOUS | Status: AC
  Administered 2019-08-05: 60 mg via SUBCUTANEOUS

## 2019-08-05 NOTE — Patient Instructions (Addendum)
DUE TO COVID-19 ONLY ONE VISITOR IS ALLOWED TO COME WITH YOU AND STAY IN THE WAITING ROOM ONLY DURING PRE OP AND PROCEDURE DAY OF SURGERY. THE 1 VISITOR MAY VISIT WITH YOU AFTER SURGERY IN YOUR PRIVATE ROOM DURING VISITING HOURS ONLY!  YOU NEED TO HAVE A COVID 19 TEST ON  Sat__6/19_____ @_10 :10______, THIS TEST MUST BE DONE BEFORE SURGERY, COME  [address] [place] , [id number].  ([place]) ONCE YOUR COVID TEST IS COMPLETED, PLEASE BEGIN THE QUARANTINE INSTRUCTIONS AS OUTLINED IN YOUR HANDOUT.                [facility]    Your procedure is scheduled on: 08/13/19    Report to [facility] Main  Entrance   Report to admitting at  8:15 AM     Call this number if you have problems the morning of surgery [phone]    Remember: Do not eat food or drink liquids :After Midnight  . BRUSH YOUR TEETH MORNING OF SURGERY AND RINSE YOUR MOUTH OUT, NO CHEWING GUM CANDY OR MINTS.     Take these medicines the morning of surgery with A SIP OF WATER: Levetiracetam, Cymbalta, Levothyroxine                                 You may not have any metal on your body including hair pins and              piercings  Do not wear jewelry, make-up, lotions, powders or perfumes, deodorant             Do not wear nail polish on your fingernails.  Do not shave  48 hours prior to surgery.                Do not bring valuables to the hospital. [place].  Contacts, dentures or bridgework may not be worn into surgery.      Patients discharged the day of surgery will not be allowed to drive home.   IF YOU ARE HAVING SURGERY AND GOING HOME THE SAME DAY, YOU MUST HAVE AN ADULT TO DRIVE YOU HOME AND BE WITH YOU FOR 24 HOURS  . YOU MAY GO HOME BY TAXI OR UBER OR ORTHERWISE, BUT AN ADULT MUST ACCOMPANY YOU HOME AND STAY WITH YOU FOR 24 HOURS.  Name and phone number of your driver:  Special Instructions: N/A               Please read over the following fact sheets you were given: _____________________________________________________________________             [facility] - Preparing for Surgery  Before surgery, you can play an important role   Because skin is not sterile, your skin needs to be as free of germs as possible.   You can reduce the number of germs on your skin by washing with CHG (chlorahexidine gluconate) soap before surgery .  CHG is an antiseptic cleaner which kills germs and bonds with the skin to continue killing germs even after washing. Please DO NOT use if you have an allergy to CHG or antibacterial soaps.   If your skin becomes reddened/irritated stop using the CHG and inform your nurse when you arrive at Short Stay. Do not shave (including legs  and underarms) for at least 48 hours prior to the first CHG shower.   Please follow these instructions carefully:  1.  Shower with CHG Soap the night before surgery and the  morning of Surgery.  2.  If you choose to wash your hair, wash your hair first as usual with your  normal  shampoo.  3.  After you shampoo, rinse your hair and body thoroughly to remove the  shampoo.                                        4.  Use CHG as you would any other liquid soap.  You can apply chg directly  to the skin and wash                       Gently with a scrungie or clean washcloth.  5.  Apply the CHG Soap to your body ONLY FROM THE NECK DOWN.   Do not use on face/ open                           Wound or open sores. Avoid contact with eyes, ears mouth and genitals (private parts).                       Wash face,  Genitals (private parts) with your normal soap.             6.  Wash thoroughly, paying special attention to the area where your surgery  will be performed.  7.  Thoroughly rinse your body with warm water from the neck down.  8.  DO NOT shower/wash with your normal soap after using and rinsing off  the CHG Soap.             9.  Pat yourself dry  with a clean towel.            10.  Wear clean pajamas.            11.  Place clean sheets on your bed the night of your first shower and do not  sleep with pets. Day of Surgery : Do not apply any lotions/deodorants the morning of surgery.  Please wear clean clothes to the hospital/surgery center.  FAILURE TO FOLLOW THESE INSTRUCTIONS MAY RESULT IN THE CANCELLATION OF YOUR SURGERY PATIENT SIGNATURE_________________________________  NURSE SIGNATURE__________________________________  ________________________________________________________________________

## 2019-08-05 NOTE — Progress Notes (Signed)
After obtaining consent, and per orders of Dr. Cruzita Lederer, injection of Prolia 60 mg given by Gwinda Maine. Patient instructed to remain in clinic for 20 minutes afterwards, and to report any adverse reaction to me immediately.

## 2019-08-06 ENCOUNTER — Encounter: Payer: Self-pay | Admitting: Physician Assistant

## 2019-08-06 ENCOUNTER — Encounter (HOSPITAL_COMMUNITY)
Admission: RE | Admit: 2019-08-06 | Discharge: 2019-08-06 | Disposition: A | Discharge status: Home / Self Care | Payer: Medicare Other | Source: Ambulatory Visit | Attending: Urology | Admitting: Urology

## 2019-08-06 ENCOUNTER — Ambulatory Visit (INDEPENDENT_AMBULATORY_CARE_PROVIDER_SITE_OTHER): Payer: Medicare Other | Admitting: Physician Assistant

## 2019-08-06 ENCOUNTER — Encounter: Payer: Self-pay | Admitting: Internal Medicine

## 2019-08-06 ENCOUNTER — Encounter (HOSPITAL_COMMUNITY): Payer: Self-pay

## 2019-08-06 ENCOUNTER — Other Ambulatory Visit: Payer: Self-pay

## 2019-08-06 VITALS — BP 126/80 | HR 74 | Temp 98.1°F | Ht 67.0 in | Wt 198.0 lb

## 2019-08-06 DIAGNOSIS — I5032 Chronic diastolic (congestive) heart failure: Secondary | ICD-10-CM

## 2019-08-06 DIAGNOSIS — Z01810 Encounter for preprocedural cardiovascular examination: Secondary | ICD-10-CM

## 2019-08-06 DIAGNOSIS — I428 Other cardiomyopathies: Secondary | ICD-10-CM | POA: Diagnosis not present

## 2019-08-06 DIAGNOSIS — Z01812 Encounter for preprocedural laboratory examination: Secondary | ICD-10-CM | POA: Diagnosis present

## 2019-08-06 DIAGNOSIS — I4891 Unspecified atrial fibrillation: Secondary | ICD-10-CM

## 2019-08-06 DIAGNOSIS — I10 Essential (primary) hypertension: Secondary | ICD-10-CM

## 2019-08-06 HISTORY — DX: Presence of cardiac pacemaker: Z95.0

## 2019-08-06 HISTORY — DX: Dyspnea, unspecified: R06.00

## 2019-08-06 NOTE — Progress Notes (Signed)
PERIOPERATIVE PRESCRIPTION FOR IMPLANTED CARDIAC DEVICE PROGRAMMING  Patient Information: Name:  Jillian Eaton [facility]  DOB:  March 18, 1950  MRN:  [id number]    Planned Procedure: Rt Cysto stent placement  Surgeon: Dr. Aleen Campi  Date of Procedure: 08/13/19  Cautery will be used.  Position during surgery:    Please send documentation back to:  [facility] (Fax # [phone])   Johny Drilling, RN  08/05/2019 10:49 AM   Device Information:  Clinic EP Physician:  Cristopher Peru, MD   Device Type:  Pacemaker Manufacturer and Phone #:  Medtronic: [id number] Pacemaker Dependent?:  Yes.   Date of Last Device Check:  07/22/2019 Normal Device Function?:  Yes.    Electrophysiologist's Recommendations:   Have magnet available.  Provide continuous ECG monitoring when magnet is used or reprogramming is to be performed.   Procedure should not interfere with device function.  No device programming or magnet placement needed.  Per Device Clinic Standing Orders, York Ram, RN  12:39 PM 08/06/2019

## 2019-08-06 NOTE — Patient Instructions (Signed)
Medication Instructions:  Your physician recommends that you continue on your current medications as directed. Please refer to the Current Medication list given to you today.  *If you need a refill on your cardiac medications before your next appointment, please call your pharmacy*    Follow-Up: At [facility], you and your health needs are our priority.  As part of our continuing mission to provide you with exceptional heart care, we have created designated Provider Care Teams.  These Care Teams include your primary Cardiologist (physician) and Advanced Practice Providers (APPs -  Physician Assistants and Nurse Practitioners) who all work together to provide you with the care you need, when you need it.  We recommend signing up for the patient portal called "MyChart".  Sign up information is provided on this After Visit Summary.  MyChart is used to connect with patients for Virtual Visits (Telemedicine).  Patients are able to view lab/test results, encounter notes, upcoming appointments, etc.  Non-urgent messages can be sent to your provider as well.   To learn more about what you can do with MyChart, go to [email or web address].    Your next appointment:   3 month(s)  The format for your next appointment:   In Person  Provider:   Raliegh Ip Mali Hilty, MD   Other Instructions We will send the note from today's visit to [facility].

## 2019-08-06 NOTE — Progress Notes (Signed)
COVID Vaccine Completed:Yes Date COVID Vaccine completed:04/18/19 COVID vaccine manufacturer:    Moderna     PCP - Dr. Leretha Pol Cardiologist - Dr. Alma Friendly  Chest x-ray - 06/21/19 EKG - 07/11/19 Stress Test - no ECHO - 06/15/19 Cardiac Cath - 2012  Sleep Study - yes negative findings CPAP - no  Fasting Blood Sugar - NA Checks Blood Sugar _____ times a day  Blood Thinner Instructions:Eliquis Aspirin Instructions:Dr. Hilty said to stop it 2 days prior to DOS Last Dose:6/20  Anesthesia review:   Patient denies shortness of breath, fever, cough and chest pain at PAT appointment yes  Patient verbalized understanding of instructions that were given to them at the PAT appointment. Patient was also instructed that they will need to review over the PAT instructions again at home before surgery. Yes  Pt was having significant SOB in April She has pulmonary hypertension. She also had a pacemaker placed and feels much better. She is able now to complete ADLs without SOB. She reports a 100 lbs wt loss after gastric surgery.

## 2019-08-07 ENCOUNTER — Encounter (HOSPITAL_COMMUNITY)
Admission: RE | Admit: 2019-08-07 | Discharge: 2019-08-07 | Disposition: A | Discharge status: Home / Self Care | Payer: Medicare Other | Source: Ambulatory Visit | Attending: Urology | Admitting: Urology

## 2019-08-07 DIAGNOSIS — Z01812 Encounter for preprocedural laboratory examination: Secondary | ICD-10-CM | POA: Diagnosis not present

## 2019-08-07 DIAGNOSIS — M25561 Pain in right knee: Secondary | ICD-10-CM | POA: Diagnosis not present

## 2019-08-07 LAB — CBC
HCT: 32.2 % — ABNORMAL LOW (ref 36.0–46.0)
Hemoglobin: 9.9 g/dL — ABNORMAL LOW (ref 12.0–15.0)
MCH: 28.1 pg (ref 26.0–34.0)
MCHC: 30.7 g/dL (ref 30.0–36.0)
MCV: 91.5 fL (ref 80.0–100.0)
Platelets: 207 10*3/uL (ref 150–400)
RBC: 3.52 MIL/uL — ABNORMAL LOW (ref 3.87–5.11)
RDW: 18.1 % — ABNORMAL HIGH (ref 11.5–15.5)
WBC: 3.9 10*3/uL — ABNORMAL LOW (ref 4.0–10.5)
nRBC: 0 % (ref 0.0–0.2)

## 2019-08-07 LAB — BASIC METABOLIC PANEL
Anion gap: 8 (ref 5–15)
BUN: 20 mg/dL (ref 8–23)
CO2: 25 mmol/L (ref 22–32)
Calcium: 7.9 mg/dL — ABNORMAL LOW (ref 8.9–10.3)
Chloride: 106 mmol/L (ref 98–111)
Creatinine, Ser: 1.05 mg/dL — ABNORMAL HIGH (ref 0.44–1.00)
GFR calc Af Amer: >60 mL/min (ref >60)
GFR calc non Af Amer: 55 mL/min — ABNORMAL LOW (ref >60)
Glucose, Bld: 79 mg/dL (ref 70–99)
Potassium: 4.1 mmol/L (ref 3.5–5.1)
Sodium: 139 mmol/L (ref 135–145)

## 2019-08-07 NOTE — Progress Notes (Signed)
Divice orders are on chart . [employer] Rep Malachy Chamber has been called

## 2019-08-08 NOTE — Anesthesia Preprocedure Evaluation (Addendum)
Anesthesia Evaluation  Patient identified by MRN, date of birth, ID band Patient awake    Reviewed: Allergy & Precautions, H&P , NPO status , Patient's Chart, lab work & pertinent test results  Airway Mallampati: I  TM Distance: >3 FB Neck ROM: Full    Dental no notable dental hx. (+) Partial Upper, Partial Lower, Dental Advisory Given   Pulmonary asthma ,    Pulmonary exam normal breath sounds clear to auscultation       Cardiovascular hypertension, +CHF  + dysrhythmias Atrial Fibrillation + pacemaker  Rhythm:Regular Rate:Normal     Neuro/Psych  Headaches, Seizures -, Well Controlled,  Depression    GI/Hepatic Neg liver ROS, GERD  ,  Endo/Other  negative endocrine ROSHypothyroidism   Renal/GU Renal disease  negative genitourinary   Musculoskeletal  (+) Arthritis , Osteoarthritis,  Fibromyalgia -  Abdominal   Peds  Hematology  (+) Blood dyscrasia, anemia ,   Anesthesia Other Findings   Reproductive/Obstetrics negative OB ROS                             Anesthesia Physical Anesthesia Plan  ASA: III  Anesthesia Plan: General   Post-op Pain Management:    Induction: Intravenous  PONV Risk Score and Plan: 4 or greater and Ondansetron, Dexamethasone and Midazolam  Airway Management Planned: LMA  Additional Equipment:   Intra-op Plan:   Post-operative Plan: Extubation in OR  Informed Consent: I have reviewed the patients History and Physical, chart, labs and discussed the procedure including the risks, benefits and alternatives for the proposed anesthesia with the patient or authorized representative who has indicated his/her understanding and acceptance.     Dental advisory given  Plan Discussed with: CRNA  Anesthesia Plan Comments: (See PAT note 08/07/2019, Konrad Felix, PA-C)       Anesthesia Quick Evaluation

## 2019-08-08 NOTE — Progress Notes (Signed)
Anesthesia Chart Review   Case: [id number] Date/Time: 08/13/19 1000   Procedure: CYSTOSCOPY/RETROGRADE/URETEROSCOPY/HOLMIUM LASER/STENT PLACEMENT (Right ) - ONLY NEEDS 45 MIN   Anesthesia type: General   Pre-op diagnosis: RIGHT RENAL STONES   [facility]   Surgeons: Ceasar Mons, MD      DISCUSSION:69 y.o. never smoker with h/o atrial fibrillation (s/p AV ablation with PPM inserted, device order on chart, on Eliquis), HTN, NICM, pulmonary HTN, s/p gastric bypass, OSA, right renal stones scheduled for above procedure 08/13/2019 with Dr. Harrell Gave Jillian Eaton.   Solid food dysphagia s/p EGD with dilation on 07/25/19.   Pt last seen by cardiology 08/06/2019 for preoperative evaluation.  Per OV note, "She can complete more than 4.0 METS without angina. Her symptoms improved significantly after EGD. DOE is improving with her increase in activity, may be related to deconditioning. Chest pain has not recurred for the last three weeks. According to the RCRI, she has a 0.9% risk of cardiac complications.  We discussed that she did have chest pain prior to her EDG concerning for angina, but this improved following esophageal dilation. She understands that she is at a slightly higher risk given her prior symptoms without definitive angiography. However, given her overall clinical picture today, I do not think further testing is warranted prior to surgery.  She wishes to proceed. I will send my note to Dr. Lovena Eaton."  Anticipate pt can proceed with planned procedure barring acute status change.   VS: BP 119/74   Pulse 88   Temp (!) 36.4 C (Oral)   Resp 16   Ht 5\' 7"  (1.702 m)   Wt 89.8 kg   SpO2 100%   BMI 31.01 kg/m   PROVIDERS: [name], MD is PCP  Lyman Bishop, MD is Cardiologist  LABS: Labs reviewed: Acceptable for surgery. (all labs ordered are listed, but only abnormal results are displayed)  Labs Reviewed  BASIC METABOLIC PANEL - Abnormal; Notable for  the following components:      Result Value   Creatinine, Ser 1.05 (*)    Calcium 7.9 (*)    GFR calc non Af Amer 55 (*)    All other components within normal limits  CBC - Abnormal; Notable for the following components:   WBC 3.9 (*)    RBC 3.52 (*)    Hemoglobin 9.9 (*)    HCT 32.2 (*)    RDW 18.1 (*)    All other components within normal limits     IMAGES:   EKG: 08/07/2019 Rate 74 bpm  CV: Echo 06/15/2019 IMPRESSIONS    1. Left ventricular ejection fraction, by estimation, is 60 to 65%. The  left ventricle has normal function. The left ventricle has no regional  wall motion abnormalities. Left ventricular diastolic parameters are  indeterminate.  2. Right ventricular systolic function is normal. The right ventricular  size is mildly enlarged. There is moderately elevated pulmonary artery  systolic pressure. The estimated right ventricular systolic pressure is  63.0 mmHg.  3. Left atrial size was moderately dilated.  4. Right atrial size was moderately dilated.  5. The mitral valve is normal in structure. Mild mitral valve  regurgitation. No evidence of mitral stenosis.  6. Tricuspid valve regurgitation is moderate.  7. The aortic valve is tricuspid. Aortic valve regurgitation is trivial.  Mild aortic valve sclerosis is present, with no evidence of aortic valve  stenosis.  8. The inferior vena cava is normal in size with greater than  50%  respiratory variability, suggesting right atrial pressure of 3 mmHg Past Medical History:  Diagnosis Date  . Anemia    iron and pernicious  . Arthritis   . CHF (congestive heart failure) ([place]) 2018  . Complication of anesthesia    BP dropped   . DOE (dyspnea on exertion)    2D ECHO, 02/12/2012 - EF 60-65%, moderate concentric hypertrophy  . Dyspnea   . Dysrhythmia    a-fib  . Fibromyalgia    nerve pain"left side at waist level" "can't lay on that side without pain" , "HOB elevation helps"; pt. thinks this has  resolved after 200lb weight loss9-17- since i lost the wieght , i dont  think i have this anymore   . Headache 04/2019  . Heart murmur   . Hematuria - cause not known    resolved   . History of kidney stones    x 2 '13, '14 surgery to remove  . Pneumonia    aspiration  04/2016  . Presence of permanent cardiac pacemaker   . SBO (small bowel obstruction) ([place])    rqueired admission 2020  . Thyroid disease    "goiter"  . Transfusion history    10 yrs+    Past Surgical History:  Procedure Laterality Date  . ABDOMINAL ADHESION SURGERY  2004   open LOA - in [place]  . AV NODE ABLATION N/A 06/20/2019   Procedure: AV NODE ABLATION;  Surgeon: Evans Lance, MD;  Location: [place] [facility];  Service: Cardiovascular;  Laterality: N/A;  . BALLOON DILATION N/A 07/25/2019   Procedure: BALLOON DILATION;  Surgeon: Carol Ada, MD;  [facility];  Service: Endoscopy;  Laterality: N/A;  . CARDIAC CATHETERIZATION  04/04/2010   No significant obstructive coronary artery disease  . CARDIOVERSION N/A 08/08/2017   Procedure: CARDIOVERSION;  Surgeon: Pixie Casino, MD;  Location: [facility];  Service: Cardiovascular;  Laterality: N/A;  . CARDIOVERSION N/A 06/18/2019   Procedure: CARDIOVERSION;  Surgeon: Pixie Casino, MD;  Location: [place];  Service: Cardiovascular;  Laterality: N/A;  . CHOLECYSTECTOMY  1990  . COLONOSCOPY W/ POLYPECTOMY    . COLONOSCOPY WITH PROPOFOL N/A 04/10/2014   Procedure: COLONOSCOPY WITH PROPOFOL;  Surgeon: Beryle Beams, MD;  [facility];  Service: Endoscopy;  Laterality: N/A;  . ESOPHAGOGASTRODUODENOSCOPY (EGD) WITH PROPOFOL N/A 07/25/2019   Procedure: ESOPHAGOGASTRODUODENOSCOPY (EGD) WITH PROPOFOL;  Surgeon: Carol Ada, MD;  [facility];  Service: Endoscopy;  Laterality: N/A;  . EYE SURGERY     lasik 20-25 yrs ago  . GASTROPLASTY VERTICAL BANDED  1985  . [place]   In [place] for SBO  .  MALONEY DILATION  07/25/2019   Procedure: Venia Minks DILATION;  Surgeon: Carol Ada, MD;  Location: [facility];  Service: Endoscopy;;  . PACEMAKER IMPLANT N/A 06/20/2019   Procedure: PACEMAKER IMPLANT;  Surgeon: Evans Lance, MD;  Location: [place] [facility];  Service: Cardiovascular;  Laterality: N/A;  . REVERSE SHOULDER ARTHROPLASTY Right 03/29/2018   Procedure: REVERSE SHOULDER ARTHROPLASTY;  Surgeon: Netta Cedars, MD;  Location: [place];  Service: Orthopedics;  Laterality: Right;  . ROUX-EN-Y GASTRIC BYPASS  1992   Conversion VBG to RnYGB in [place], [place]  . SHOULDER ARTHROSCOPY WITH SUBACROMIAL DECOMPRESSION  2016   Dr Berenice Primas, [place], [place]  . SMALL INTESTINE SURGERY  2000   SBO - LOA w SB resection  . TOTAL KNEE ARTHROPLASTY Left 11/14/2018   Procedure: TOTAL KNEE ARTHROPLASTY;  Surgeon: Paralee Cancel, MD;  [facility];  Service: Orthopedics;  Laterality: Left;  70 mins  . TOTAL KNEE ARTHROPLASTY Right 04/29/2019   Procedure: TOTAL KNEE ARTHROPLASTY;  Surgeon: Paralee Cancel, MD;  [facility];  Service: Orthopedics;  Laterality: Right;  70 mins  . TOTAL THYROIDECTOMY  06/26/2012   Dr Celine Ahr, [facility]  . TUBAL LIGATION  1986  . URETHRAL DILATION  2012   w cystoscopy.  Dr Gaynelle Arabian  . UTERINE FIBROID EMBOLIZATION      MEDICATIONS: . acetaminophen (TYLENOL) 500 MG tablet  . acetaminophen (TYLENOL) 650 MG CR tablet  . albuterol (VENTOLIN HFA) 108 (90 Base) MCG/ACT inhaler  . apixaban (ELIQUIS) 5 MG TABS tablet  . calcitRIOL (ROCALTROL) 0.25 MCG capsule  . clotrimazole (LOTRIMIN) 1 % cream  . cyanocobalamin (,VITAMIN B-12,) 1000 MCG/ML injection  . cyclobenzaprine (FLEXERIL) 10 MG tablet  . DULoxetine (CYMBALTA) 60 MG capsule  . eszopiclone (LUNESTA) 2 MG TABS tablet  . Fluticasone Furoate (ARNUITY ELLIPTA) 100 MCG/ACT AEPB  . FOLIC ACID PO  . furosemide (LASIX) 20 MG tablet  . levETIRAcetam (KEPPRA) 500 MG tablet  . levothyroxine (SYNTHROID) 137 MCG tablet   . magnesium oxide (MAG-OX) 400 (241.3 Mg) MG tablet  . NONFORMULARY OR COMPOUNDED ITEM  . traZODone (DESYREL) 50 MG tablet  . Vitamin D, Ergocalciferol, (DRISDOL) 1.25 MG (50000 UT) CAPS capsule   No current facility-administered medications for this encounter.    Maia Plan [facility] Pre-Surgical Testing [phone] 08/08/19  1:38 PM

## 2019-08-09 ENCOUNTER — Other Ambulatory Visit (HOSPITAL_COMMUNITY)
Admission: RE | Admit: 2019-08-09 | Discharge: 2019-08-09 | Disposition: A | Discharge status: Home / Self Care | Payer: Medicare Other | Source: Ambulatory Visit | Attending: Urology | Admitting: Urology

## 2019-08-09 DIAGNOSIS — Z01812 Encounter for preprocedural laboratory examination: Secondary | ICD-10-CM | POA: Diagnosis present

## 2019-08-09 DIAGNOSIS — Z20822 Contact with and (suspected) exposure to covid-19: Secondary | ICD-10-CM | POA: Insufficient documentation

## 2019-08-09 LAB — SARS CORONAVIRUS 2 (TAT 6-24 HRS): SARS Coronavirus 2: NEGATIVE

## 2019-08-12 ENCOUNTER — Encounter (HOSPITAL_COMMUNITY): Payer: Self-pay | Admitting: Urology

## 2019-08-12 ENCOUNTER — Other Ambulatory Visit: Payer: Self-pay | Admitting: Internal Medicine

## 2019-08-13 ENCOUNTER — Ambulatory Visit (HOSPITAL_COMMUNITY): Payer: Medicare Other | Admitting: Physician Assistant

## 2019-08-13 ENCOUNTER — Ambulatory Visit (HOSPITAL_COMMUNITY): Payer: Medicare Other | Admitting: Anesthesiology

## 2019-08-13 ENCOUNTER — Encounter (HOSPITAL_COMMUNITY): Payer: Self-pay | Admitting: Urology

## 2019-08-13 ENCOUNTER — Ambulatory Visit (HOSPITAL_COMMUNITY): Payer: Medicare Other

## 2019-08-13 ENCOUNTER — Ambulatory Visit (HOSPITAL_COMMUNITY)
Admission: RE | Admit: 2019-08-13 | Discharge: 2019-08-13 | Disposition: A | Discharge status: Home / Self Care | Payer: Medicare Other | Source: Ambulatory Visit | Attending: Urology | Admitting: Urology

## 2019-08-13 ENCOUNTER — Encounter (HOSPITAL_COMMUNITY)
Admission: RE | Disposition: A | Discharge status: Home / Self Care | Payer: Self-pay | Source: Ambulatory Visit | Attending: Urology

## 2019-08-13 DIAGNOSIS — N132 Hydronephrosis with renal and ureteral calculous obstruction: Secondary | ICD-10-CM | POA: Insufficient documentation

## 2019-08-13 DIAGNOSIS — Z79899 Other long term (current) drug therapy: Secondary | ICD-10-CM | POA: Insufficient documentation

## 2019-08-13 DIAGNOSIS — Z950 Presence of cardiac pacemaker: Secondary | ICD-10-CM | POA: Diagnosis not present

## 2019-08-13 DIAGNOSIS — Z87442 Personal history of urinary calculi: Secondary | ICD-10-CM | POA: Diagnosis not present

## 2019-08-13 DIAGNOSIS — E890 Postprocedural hypothyroidism: Secondary | ICD-10-CM | POA: Diagnosis not present

## 2019-08-13 DIAGNOSIS — I110 Hypertensive heart disease with heart failure: Secondary | ICD-10-CM | POA: Diagnosis not present

## 2019-08-13 DIAGNOSIS — I509 Heart failure, unspecified: Secondary | ICD-10-CM | POA: Diagnosis not present

## 2019-08-13 DIAGNOSIS — J45909 Unspecified asthma, uncomplicated: Secondary | ICD-10-CM | POA: Insufficient documentation

## 2019-08-13 DIAGNOSIS — G40909 Epilepsy, unspecified, not intractable, without status epilepticus: Secondary | ICD-10-CM | POA: Diagnosis not present

## 2019-08-13 DIAGNOSIS — Z885 Allergy status to narcotic agent status: Secondary | ICD-10-CM | POA: Diagnosis not present

## 2019-08-13 DIAGNOSIS — Z79890 Hormone replacement therapy (postmenopausal): Secondary | ICD-10-CM | POA: Diagnosis not present

## 2019-08-13 DIAGNOSIS — I4891 Unspecified atrial fibrillation: Secondary | ICD-10-CM | POA: Diagnosis not present

## 2019-08-13 DIAGNOSIS — Z9884 Bariatric surgery status: Secondary | ICD-10-CM | POA: Insufficient documentation

## 2019-08-13 DIAGNOSIS — K449 Diaphragmatic hernia without obstruction or gangrene: Secondary | ICD-10-CM | POA: Diagnosis not present

## 2019-08-13 HISTORY — PX: CYSTOSCOPY/URETEROSCOPY/HOLMIUM LASER/STENT PLACEMENT: SHX6546

## 2019-08-13 SURGERY — CYSTOSCOPY/URETEROSCOPY/HOLMIUM LASER/STENT PLACEMENT
Anesthesia: General | Laterality: Right

## 2019-08-13 MED ORDER — MIDAZOLAM HCL 2 MG/2ML IJ SOLN
INTRAMUSCULAR | Status: AC
  Filled 2019-08-13: qty 2

## 2019-08-13 MED ORDER — CEFAZOLIN SODIUM-DEXTROSE 2-3 GM-%(50ML) IV SOLR
INTRAVENOUS | Status: DC | PRN
  Administered 2019-08-13: 2 g via INTRAVENOUS

## 2019-08-13 MED ORDER — PHENYLEPHRINE 40 MCG/ML (10ML) SYRINGE FOR IV PUSH (FOR BLOOD PRESSURE SUPPORT)
PREFILLED_SYRINGE | INTRAVENOUS | Status: AC
  Filled 2019-08-13: qty 20

## 2019-08-13 MED ORDER — HYDROMORPHONE HCL 2 MG PO TABS: 2.0000 mg | ORAL_TABLET | Freq: Two times a day (BID) | ORAL | 0 refills | Status: AC | PRN

## 2019-08-13 MED ORDER — DEXAMETHASONE SODIUM PHOSPHATE 4 MG/ML IJ SOLN
INTRAMUSCULAR | Status: DC | PRN
  Administered 2019-08-13: 10 mg via INTRAVENOUS

## 2019-08-13 MED ORDER — LACTATED RINGERS IV SOLN: INTRAVENOUS | Status: DC

## 2019-08-13 MED ORDER — LIDOCAINE HCL (CARDIAC) PF 100 MG/5ML IV SOSY
PREFILLED_SYRINGE | INTRAVENOUS | Status: DC | PRN
  Administered 2019-08-13: 60 mg via INTRAVENOUS

## 2019-08-13 MED ORDER — OXYBUTYNIN CHLORIDE 5 MG PO TABS: 5.0000 mg | ORAL_TABLET | Freq: Three times a day (TID) | ORAL | 1 refills | Status: DC | PRN

## 2019-08-13 MED ORDER — IOHEXOL 300 MG/ML  SOLN
INTRAMUSCULAR | Status: DC | PRN
  Administered 2019-08-13: 50 mL via URETHRAL

## 2019-08-13 MED ORDER — ONDANSETRON HCL 4 MG/2ML IJ SOLN
INTRAMUSCULAR | Status: AC
  Filled 2019-08-13: qty 2

## 2019-08-13 MED ORDER — EPHEDRINE 5 MG/ML INJ
INTRAVENOUS | Status: AC
  Filled 2019-08-13: qty 20

## 2019-08-13 MED ORDER — PROPOFOL 10 MG/ML IV BOLUS
INTRAVENOUS | Status: DC | PRN
  Administered 2019-08-13: 100 mg via INTRAVENOUS

## 2019-08-13 MED ORDER — PHENAZOPYRIDINE HCL 200 MG PO TABS
200.0000 mg | ORAL_TABLET | Freq: Three times a day (TID) | ORAL | 0 refills | Status: DC | PRN
Start: 2019-08-13 — End: 2019-09-12

## 2019-08-13 MED ORDER — FENTANYL CITRATE (PF) 100 MCG/2ML IJ SOLN
INTRAMUSCULAR | Status: DC | PRN
  Administered 2019-08-13: 50 ug via INTRAVENOUS
  Administered 2019-08-13 (×2): 25 ug via INTRAVENOUS

## 2019-08-13 MED ORDER — ORAL CARE MOUTH RINSE: 15.0000 mL | Freq: Once | OROMUCOSAL | Status: AC

## 2019-08-13 MED ORDER — CEFAZOLIN SODIUM-DEXTROSE 2-4 GM/100ML-% IV SOLN
2.0000 g | Freq: Once | INTRAVENOUS | Status: DC
  Filled 2019-08-13: qty 100

## 2019-08-13 MED ORDER — CHLORHEXIDINE GLUCONATE 0.12 % MT SOLN
15.0000 mL | Freq: Once | OROMUCOSAL | Status: AC
  Administered 2019-08-13: 15 mL via OROMUCOSAL

## 2019-08-13 MED ORDER — PHENYLEPHRINE HCL (PRESSORS) 10 MG/ML IV SOLN
INTRAVENOUS | Status: DC | PRN
Start: 2019-08-13 — End: 2019-08-13
  Administered 2019-08-13: 80 ug via INTRAVENOUS
  Administered 2019-08-13: 120 ug via INTRAVENOUS
  Administered 2019-08-13: 80 ug via INTRAVENOUS
  Administered 2019-08-13: 40 ug via INTRAVENOUS
  Administered 2019-08-13: 80 ug via INTRAVENOUS

## 2019-08-13 MED ORDER — EPHEDRINE SULFATE-NACL 50-0.9 MG/10ML-% IV SOSY
PREFILLED_SYRINGE | INTRAVENOUS | Status: DC | PRN
  Administered 2019-08-13: 5 mg via INTRAVENOUS

## 2019-08-13 MED ORDER — MIDAZOLAM HCL 5 MG/5ML IJ SOLN
INTRAMUSCULAR | Status: DC | PRN
  Administered 2019-08-13: 2 mg via INTRAVENOUS

## 2019-08-13 MED ORDER — DEXAMETHASONE SODIUM PHOSPHATE 10 MG/ML IJ SOLN
INTRAMUSCULAR | Status: AC
  Filled 2019-08-13: qty 1

## 2019-08-13 MED ORDER — ONDANSETRON HCL 4 MG/2ML IJ SOLN
INTRAMUSCULAR | Status: DC | PRN
  Administered 2019-08-13: 4 mg via INTRAVENOUS

## 2019-08-13 MED ORDER — CEPHALEXIN 500 MG PO CAPS
500.0000 mg | ORAL_CAPSULE | Freq: Two times a day (BID) | ORAL | 0 refills | Status: DC
Start: 2019-08-13 — End: 2019-08-15

## 2019-08-13 MED ORDER — FENTANYL CITRATE (PF) 100 MCG/2ML IJ SOLN
INTRAMUSCULAR | Status: AC
  Filled 2019-08-13: qty 2

## 2019-08-13 MED ORDER — SODIUM CHLORIDE 0.9 % IR SOLN
Status: DC | PRN
  Administered 2019-08-13: 3000 mL via INTRAVESICAL
  Administered 2019-08-13: 1000 mL

## 2019-08-13 SURGICAL SUPPLY — 18 items
BAG URO CATCHER STRL LF (MISCELLANEOUS) ×2 IMPLANT
BASKET ZERO TIP NITINOL 2.4FR (BASKET) ×2 IMPLANT
CATH URET 5FR 28IN OPEN ENDED (CATHETERS) ×2 IMPLANT
CLOTH BEACON ORANGE TIMEOUT ST (SAFETY) ×2 IMPLANT
EXTRACTOR STONE NITINOL NGAGE (UROLOGICAL SUPPLIES) IMPLANT
FIBER LASER FLEXIVA 365 (UROLOGICAL SUPPLIES) IMPLANT
FIBER LASER TRAC TIP (UROLOGICAL SUPPLIES) ×2 IMPLANT
GLOVE BIOGEL M STRL SZ7.5 (GLOVE) ×2 IMPLANT
GOWN STRL REUS W/TWL XL LVL3 (GOWN DISPOSABLE) ×2 IMPLANT
GUIDEWIRE ZIPWRE .038 STRAIGHT (WIRE) ×2 IMPLANT
KIT TURNOVER KIT A (KITS) IMPLANT
MANIFOLD NEPTUNE II (INSTRUMENTS) ×2 IMPLANT
SHEATH URETERAL 12FRX35CM (MISCELLANEOUS) IMPLANT
STENT URET 6FRX24 CONTOUR (STENTS) ×2 IMPLANT
STENT URET 6FRX26 CONTOUR (STENTS) IMPLANT
TRAY CYSTO PACK (CUSTOM PROCEDURE TRAY) ×2 IMPLANT
TUBING CONNECTING 10 (TUBING) ×2 IMPLANT
TUBING UROLOGY SET (TUBING) ×2 IMPLANT

## 2019-08-13 NOTE — Anesthesia Postprocedure Evaluation (Signed)
Anesthesia Post Note  Patient: Jillian Eaton  Procedure(s) Performed: CYSTOSCOPY/RETROGRADE/URETEROSCOPY/HOLMIUM LASER/STENT PLACEMENT (Right )     Patient location during evaluation: PACU Anesthesia Type: General Level of consciousness: awake and alert Pain management: pain level controlled Vital Signs Assessment: post-procedure vital signs reviewed and stable Respiratory status: spontaneous breathing, nonlabored ventilation and respiratory function stable Cardiovascular status: blood pressure returned to baseline and stable Postop Assessment: no apparent nausea or vomiting Anesthetic complications: no   No complications documented.  Last Vitals:  Vitals:   08/13/19 1145 08/13/19 1206  BP: 120/82   Pulse:    Resp: 17   Temp:  36.7 C  SpO2: 98%     Last Pain:  Vitals:   08/13/19 1206  TempSrc:   PainSc: 0-No pain                 Rual Vermeer,W. EDMOND

## 2019-08-13 NOTE — Transfer of Care (Signed)
Immediate Anesthesia Transfer of Care Note  Patient: Jillian Eaton  Procedure(s) Performed: Procedure(s) with comments: CYSTOSCOPY/RETROGRADE/URETEROSCOPY/HOLMIUM LASER/STENT PLACEMENT (Right) - ONLY NEEDS 45 MIN  Patient Location: PACU  Anesthesia Type:General  Level of Consciousness:  sedated, patient cooperative and responds to stimulation  Airway & Oxygen Therapy:Patient Spontanous Breathing and Patient connected to face mask oxgen  Post-op Assessment:  Report given to PACU RN and Post -op Vital signs reviewed and stable  Post vital signs:  Reviewed and stable  Last Vitals:  Vitals:   08/13/19 1015 08/13/19 1020  BP:    Pulse: 75 75  Resp: (!) 21 17  Temp:    SpO2: 74% 600%    Complications: No apparent anesthesia complications

## 2019-08-13 NOTE — H&P (Signed)
PRE-OP H&P   Visit Report     07/14/2019   --------------------------------------------------------------------------------   Jillian Eaton  MRN: [id number]  DOB: [birth year], 69 year old Female   PRIMARY CARE:  Donato Schultz, MD  REFERRING:  Servando Salina, MD  PROVIDER:  Ellison Hughs, M.D.  LOCATION:  [facility] [phone]     --------------------------------------------------------------------------------   CC: I have pain in the flank.  HPI: Jillian Eaton is a 69 year-old female patient who was referred by Dr. Servando Salina, MD who is here for flank pain.  The problem is on the right side. Her pain started about approximately 04/21/2019. The pain is dull. The intensity of her pain is rated as a 3. The pain is intermittent. The pain does not radiate.   Resting< makes the pain better. Twisting makes the pain worse. She has not been treated with any pain medications.   She has not had this same pain previously. She has had kidney stones.   -CTSS from 06/14/19 showed two stones measuring 62mm and 9 mm in the distal aspects of the right ureter with moderate to severe hydronephrosis  -She reports intermittent episodes of dull right flank pain associated with N/V. Denies fever/chills. AFVSS today.  -Recent labs show mild AKI  -She was unable to leave a urine specimen today, but denies dysuria or hematuria.     ALLERGIES: Percocet TABS    MEDICATIONS: Albuterol Sulfate 2.5 mg/3 ml (0.083 %) vial, nebulizer Inhalation  Caltrate Plus TABS Oral  Cymbalta 60 MG Oral Capsule Delayed Release Particles Oral  Folic Acid  Keppra  Levothyroxine  Lunesta 3 mg tablet Oral  Trazodone Hcl  Vitamin B-12 SOLN 0 Injection  Vitamin D 50000 UNIT CAPS Oral     GU PSH: Cysto Dilate Stricture (M or F) - 2012       PSH Notes: Cystoscopy For Urethral Stricture, Gastric Surgery For Morbid Obesity Gastroplasty, Cholecystectomy, Knee Surgery, Uterine Surgery,  Shoulder replacement, Pacemaker.   NON-GU PSH: Anesth, Shoulder Replacement - 04/15/2018 Cholecystectomy (open) - 2011 Gastroplasty For Obesity - 2011 Pacemaker placement - 06/20/2019 Remove Thyroid - 2016     GU PMH: Cystocele, Unspec, Vaginal wall prolapse - 2014 Gross hematuria, Gross hematuria - 2014 Other microscopic hematuria, Microscopic hematuria - 2014 Urethral Stricture, Unspec, Meatal stenosis - 2014 Urge incontinence, Urge incontinence of urine - 2014      PMH Notes:  1898-02-20 00:00:00 - Note: Normal Routine History And Physical Adult  2009-12-17 10:09:12 - Note: Arthritis   NON-GU PMH: Asthma, Asthma - 2014 Fibromyalgia, Fibromyalgia - 2014 Obesity, Morbid obesity - 2014 Personal history of other diseases of the circulatory system, History of hypertension - 2014 Personal history of other diseases of the digestive system, History of esophageal reflux - 2014 Acute right heart failure Anxiety Arrhythmia Arthritis Atrial Fibrillation GERD Heart disease, unspecified Hypertension Seizure disorder    FAMILY HISTORY: Death In The Family Father - Runs In Family Death In The Family Mother - Runs In Family Diabetes - Runs In Family Dialysis - Father Epilepsy - Mother Catharine is 1 daughter and 1 - Runs In [place] _4__ Living Daughter - Runs In Family Hypertension - Runs In Family   SOCIAL HISTORY: Marital Status: Married Preferred Language: English Current Smoking Status: Patient has never smoked.   Tobacco Use Assessment Completed: Used Tobacco in last 30 days? Has never drank.  Does not drink caffeine. Patient's occupation is/was Retired.  Notes: Never A Smoker, Retired From Work, Marital History - Currently Married, Caffeine Use, Alcohol Use, Tobacco Use   REVIEW OF SYSTEMS:    GU Review Female:   Patient reports leakage of urine. Patient denies frequent urination, hard to postpone urination, burning  /pain with urination, get up at night to urinate, stream starts and stops, trouble starting your stream, have to strain to urinate, and being pregnant.  Gastrointestinal (Upper):   Patient reports nausea and vomiting. Patient denies indigestion/ heartburn.  Gastrointestinal (Lower):   Patient reports constipation. Patient denies diarrhea.  Constitutional:   Patient reports night sweats, weight loss, and fatigue. Patient denies fever.  Skin:   Patient denies skin rash/ lesion and itching.  Eyes:   Patient denies blurred vision and double vision.  Ears/ Nose/ Throat:   Patient reports sinus problems. Patient denies sore throat.  Hematologic/Lymphatic:   Patient reports easy bruising. Patient denies swollen glands.  Cardiovascular:   Patient reports leg swelling and chest pains.   Respiratory:   Patient reports shortness of breath. Patient denies cough.  Endocrine:   Patient denies excessive thirst.  Musculoskeletal:   Patient reports back pain. Patient denies joint pain.  Neurological:   Patient reports headaches. Patient denies dizziness.  Psychologic:   Patient denies depression and anxiety.   VITAL SIGNS:      07/14/2019 01:36 PM  Weight 195 lb / 88.45 kg  Height 67 in / 170.18 cm  BP 87/66 mmHg  Heart Rate 71 /min  Temperature 97.1 F / 36.1 C  BMI 30.5 kg/m   GU PHYSICAL EXAMINATION:    Breast: Symmetrical. No tenderness, no nipple discharge, no skin changes. No mass.  Digital Rectal Exam: Normal sphincter tone. No rectal mass.  External Genitalia: No hirsutism, no rash, no scarring, no cyst, no erythematous lesion, no papular lesion, no blanched lesion, no warty lesion. No edema.  Urethral Meatus: Normal size. Normal position. No discharge.  Urethra: No tenderness, no mass, no scarring. No hypermobility. No leakage.  Bladder: Normal to palpation, no tenderness, no mass, normal size.  Vagina: No atrophy, no stenosis. No rectocele. No cystocele. No enterocele.  Cervix: No  inflammation, no discharge, no lesion, no tenderness, no wart.  Uterus: Normal size. Normal consistency. Normal position. No mobility. No descent.  Adnexa / Parametria: No tenderness. No adnexal mass. Normal left ovary. Normal right ovary.  Anus and Perineum: No hemorrhoids. No anal stenosis. No rectal fissure, no anal fissure. No edema, no dimple, no perineal tenderness, no anal tenderness.   MULTI-SYSTEM PHYSICAL EXAMINATION:    Constitutional: Well-nourished. No physical deformities. Normally developed. Good grooming.  Neck: Neck symmetrical, not swollen. Normal tracheal position.  Respiratory: No labored breathing, no use of accessory muscles.   Cardiovascular: Normal temperature, normal extremity pulses, no swelling, no varicosities.  Lymphatic: No enlargement of neck, axillae, groin.  Skin: No paleness, no jaundice, no cyanosis. No lesion, no ulcer, no rash.  Neurologic / Psychiatric: Oriented to time, oriented to place, oriented to person. No depression, no anxiety, no agitation.  Gastrointestinal: No mass, no tenderness, no rigidity, non obese abdomen.  Eyes: Normal conjunctivae. Normal eyelids.  Ears, Nose, Mouth, and Throat: Left ear no scars, no lesions, no masses. Right ear no scars, no lesions, no masses. Nose no scars, no lesions, no masses. Normal hearing. Normal lips.  Musculoskeletal: Normal gait and station of head and neck.     Complexity of Data:  Records Review:   Previous Patient Records  X-Ray Review: C.T.  Abdomen/Pelvis: Reviewed Films. Reviewed Report. Discussed With Patient.    Notes:                     CLINICAL DATA: Weakness, fatigue and increased heart rate.   EXAM:  CT CHEST, ABDOMEN, AND PELVIS WITH CONTRAST   TECHNIQUE:  Multidetector CT imaging of the chest, abdomen and pelvis was  performed following the standard protocol during bolus  administration of intravenous contrast.   CONTRAST: 178mL OMNIPAQUE IOHEXOL 300 MG/ML SOLN   COMPARISON: January 04, 2019   FINDINGS:  CT CHEST FINDINGS   Cardiovascular: No intraluminal filling defects are seen within the  pulmonary arteries. The pulmonary inflow tract measures  approximately 3.8 cm while the right pulmonary artery measures 3.8  cm.. Normal heart size. No pericardial effusion.   Mediastinum/Nodes:   Lungs/Pleura: Mild linear scarring and/or atelectasis is seen within  the right lower lobe.   There is no evidence of a pleural effusion or pneumothorax.   Musculoskeletal: A right shoulder prosthesis is seen with associated  streak artifact.   Multilevel degenerative changes seen throughout the thoracic spine.   CT ABDOMEN PELVIS FINDINGS   Hepatobiliary: No focal liver abnormality is seen. Status post  cholecystectomy. No biliary dilatation.   Pancreas: Unremarkable. No pancreatic ductal dilatation or  surrounding inflammatory changes.   Spleen: Normal in size without focal abnormality.   Adrenals/Urinary Tract: Adrenal glands are unremarkable. There is  mild asymmetric enlargement of the right kidney. The left kidney is  normal in size. There is marked severity right-sided hydronephrosis  and hydroureter with delayed renal cortical enhancement seen  throughout the right kidney. A 9 mm obstructing renal stone is seen  in the distal right ureter, with additional 7 mm obstructing renal  stone seen at the right UVJ.. Bladder is unremarkable.   Stomach/Bowel: Multiple surgical clips and surgical sutures are seen  within the gastric region. There is a small hiatal hernia.  Surgically anastomosed bowel is seen within the anterior aspect of  the right upper quadrant.Marland Kitchen Appendix appears normal. No evidence of  bowel wall thickening, distention, or inflammatory changes.   Vascular/Lymphatic: No significant vascular findings are present. No  enlarged abdominal or pelvic lymph nodes.   Reproductive: Multiple coarse calcifications of various sizes are  seen scattered  throughout the uterus.   Other: No abdominal wall hernia or abnormality. No abdominopelvic  ascites.   Musculoskeletal: Multilevel degenerative changes are seen throughout  the lumbar spine.   IMPRESSION:  1. 9 mm obstructing renal stone in the distal right ureter, with  additional 7 mm obstructing renal stone seen at the right UVJ.  2. Evidence of prior gastric bypass surgery.  3. Small hiatal hernia.  4. No acute or active cardiopulmonary disease.    Electronically Signed  By: Virgina Norfolk M.D.  On: 06/14/2019 19:31   PROCEDURES:         KUB - [id number]  A single view of the abdomen is obtained.      Patient confirmed No Neulasta OnPro Device.   -There are two calcifications along the expected course of the right distal ureter, consistent with the stones seen on recent CT. No urinary calculi seen within the upper urinary tract on the left. No calcifications noted within bladder. No boney abnormalities. No abnormal bowel/gas patterns or signs of free air.    ASSESSMENT:      ICD-10 Details  1 GU:   Ureteral calculus - N20.1 Right, Acute, Systemic Symptoms  2   Flank Pain - R10.84 Undiagnosed New Problem   PLAN:           Orders Labs Urine Culture, BMP  X-Rays: KUB          Schedule Return Visit/Planned Activity: Next Available Appointment - Schedule Surgery          Document Letter(s):  Created for Patient: Clinical Summary   Created for Servando Salina, MD   Created for Donato Schultz, MD         Notes:   The risks, benefits and alternatives of cystoscopy with RIGHT ureteroscopy, laser lithotripsy and ureteral stent placement was discussed the patient. Risks included, but are not limited to: bleeding, urinary tract infection, ureteral injury/avulsion, ureteral stricture formation, retained stone fragments, the possibility that multiple surgeries may be required to treat the stone(s), MI, stroke, PE and the inherent risks of general anesthesia. The patient  voices understanding and wishes to proceed.

## 2019-08-13 NOTE — Anesthesia Procedure Notes (Signed)
Procedure Name: LMA Insertion Date/Time: 08/13/2019 10:29 AM Performed by: Lavina Hamman, CRNA Pre-anesthesia Checklist: Patient identified, Emergency Drugs available, Suction available and Patient being monitored Patient Re-evaluated:Patient Re-evaluated prior to induction Oxygen Delivery Method: Circle system utilized Preoxygenation: Pre-oxygenation with 100% oxygen Induction Type: IV induction LMA: LMA inserted LMA Size: 4.0 Number of attempts: 1 Placement Confirmation: CO2 detector and breath sounds checked- equal and bilateral Tube secured with: Tape Dental Injury: Teeth and Oropharynx as per pre-operative assessment  Comments: Inserted by Frutoso Chase

## 2019-08-13 NOTE — Op Note (Signed)
Operative Note  Preoperative diagnosis:  1.  7 and 9 mm right distal ureteral calculi  Postoperative diagnosis: 1.  9 mm right distal ureteral calculus  Procedure(s): 1.  Cystoscopy with right ureteroscopy, holmium laser lithotripsy and right JJ stent placement 2.  Right retrograde pyelogram with intraoperative interpretation fluoroscopic imaging  Surgeon: Ellison Hughs, MD  Assistants:  None  Anesthesia:  General  Complications:  None  EBL: 5 mL  Specimens: 1.  Right ureteral stone fragments  Drains/Catheters: 1.  Right 6 French, 24 cm JJ stent without tether  Intraoperative findings:   1. Right retrograde pyelogram revealed a large filling defect within the distal aspects of the right ureter, consistent with the 9 mm stone seen on CT from April 2021.  The proximal aspects of the right ureter and right renal pelvis were uniformly dilated without any additional filling defects. 2. 9 mm right distal ureteral stone with no evidence of an additional 7 mm stone 3. 1 cm distal ureteral mucosal tear  Indication:  Jillian Eaton is a 70 y.o. female with a partially obstructing right distal ureteral calculus associated with moderate hydronephrosis.  She has been consented for the above procedures, voices understanding and wishes to proceed.  Description of procedure:  After informed consent was obtained, the patient was brought to the operating room and general LMA anesthesia was administered. The patient was then placed in the dorsolithotomy position and prepped and draped in the usual sterile fashion. A timeout was performed. A 23 French rigid cystoscope was then inserted into the urethral meatus and advanced into the bladder under direct vision. A complete bladder survey revealed no intravesical pathology.  A 5 French ureteral catheter was then inserted into the right ureteral orifice and a retrograde pyelogram was obtained, with the findings listed above.  A  Glidewire was then used to intubate the lumen of the ureteral catheter and was advanced up to the right renal pelvis, under fluoroscopic guidance.  The catheter was then removed, leaving the wire in place.  A semirigid ureteroscope was then advanced alongside the wire and into the distal aspect of the right ureter where her obstructing stone was identified.  A 200 m holmium laser was then used to fracture the stone into several smaller pieces.  A 0 tip basket was then used to extract the numerous stone fragments.  After all stone fragments were removed, patient was found to have a 1 cm mucosal tear at the 6 o'clock position.  The semirigid ureteroscope was then advanced up to the right UPJ, identifying no additional stone burden or trauma.  A 6 French, 24 cm JJ stent was then advanced over the wire and into good position within the right collecting system, confirming placement via fluoroscopy.  The patient's bladder was drained and the majority of her stone fragments were removed from the lumen of the bladder.  She tolerated the procedure well and was transferred to the postanesthesia in stable condition.  Plan: Follow-up in 2 weeks for office cystoscopy and stent removal

## 2019-08-14 ENCOUNTER — Encounter (HOSPITAL_COMMUNITY): Payer: Self-pay | Admitting: Urology

## 2019-08-15 ENCOUNTER — Other Ambulatory Visit: Payer: Self-pay

## 2019-08-15 ENCOUNTER — Ambulatory Visit (INDEPENDENT_AMBULATORY_CARE_PROVIDER_SITE_OTHER): Payer: Medicare Other | Admitting: Neurology

## 2019-08-15 ENCOUNTER — Encounter: Payer: Self-pay | Admitting: Neurology

## 2019-08-15 VITALS — BP 123/76 | HR 67 | Ht 67.0 in | Wt 205.4 lb

## 2019-08-15 DIAGNOSIS — N201 Calculus of ureter: Secondary | ICD-10-CM | POA: Diagnosis not present

## 2019-08-15 DIAGNOSIS — R569 Unspecified convulsions: Secondary | ICD-10-CM | POA: Diagnosis not present

## 2019-08-15 NOTE — Progress Notes (Signed)
NEUROLOGY CONSULTATION NOTE  Jillian Eaton MRN: [id number] DOB: 1950-09-06  Referring provider: Thedore Mins, NP Primary care provider: Dr. Latanya Presser  Reason for consult:  seizures  Thank you for your kind referral of Jillian Eaton for consultation of the above symptoms. Although her history is well known to you, please allow me to reiterate it for the purpose of our medical record. She is alone in the office today. Records and images were personally reviewed where available.   HISTORY OF PRESENT ILLNESS: This is a pleasant 69 year old right-handed woman with a history of atrial fibrillation on Eliquis, s/p PPM, thyroid microcarcinoma, anemia, fibromyalgia, presenting for evaluation of seizures/medication. She is referred due to concern for Levetiracetam causing anemia. Records from her hospitalization in April 2020 were reviewed. She was at [facility] the day prior for fever and flu-like symptoms, workup unremarkable and discharged home. The next morning, she was not feeling like herself, "sometimes staring off into space." When EMS arrived, she was staring to the right then had a GTC lasting 2 minutes with tongue bite. She had another seizure in the ER with right-sided arm and leg jerking, per ER staff she had around 4 seizures since admission. Her EEG showed diffuse slowing. I personally reviewed MRI brain without contrast which was limited by movement, no acute changes seen. She was discharged home on Levetiracetam 500mg  BID and saw neurologist Dr. Natasha Bence [facility] in follow-up, last visit 09/2018.  She had a follow-up MRI brain with and without contrast in 10/2018 which did not show any acute changes. There was mild chronic microvascular disease, mild atrophy of the frontal and parietal lobes. She denies any seizures or seizure-like symptoms since 05/2018. She had self-reduced Levetiracetam 2 months ago to 500mg  every morning. She denies any  staring/unresponsive episodes, gaps in time, olfactory/gustatory hallucinations, deja vu, rising epigastric sensation, focal numbness/tingling, myoclonic jerks. She has left arm arthritis and weakness. She reportedly had a seizure in March 2018 when she accidentally overdosed on pain medication and was found face down in a pool of vomit. She was intubated at that time and was not started on seizure medication. She was previously on Lyrica for fibromyalgia and ran out of Lyrica a week prior to the seizures in 05/2018. She denies any headaches. She has occasional low back pain, no neck pain, bowel/bladder dysfunction. She has a history of esophageal dilatation. Memory is okay. Her sister started having seizures at age 57 and passed away from a seizure at age 72. Her mother was diagnosed with epilepsy in her 51s. She had a normal birth and early development.  There is no history of febrile convulsions, CNS infections such as meningitis/encephalitis, significant traumatic brain injury, neurosurgical procedures.  Laboratory Data: Lab Results  Component Value Date   WBC 3.9 (L) 08/07/2019   HGB 9.9 (L) 08/07/2019   HCT 32.2 (L) 08/07/2019   MCV 91.5 08/07/2019   PLT 207 08/07/2019      PAST MEDICAL HISTORY: Past Medical History:  Diagnosis Date  . Anemia    iron and pernicious  . Arthritis   . CHF (congestive heart failure) ([place]) 2018  . Complication of anesthesia    BP dropped   . DOE (dyspnea on exertion)    2D ECHO, 02/12/2012 - EF 60-65%, moderate concentric hypertrophy  . Dyspnea   . Dysrhythmia    a-fib  . Fibromyalgia    nerve pain"left side at waist level" "can't lay on that side without pain" , "HOB  elevation helps"; pt. thinks this has resolved after 200lb weight loss9-17- since i lost the wieght , i dont  think i have this anymore   . Headache 04/2019  . Heart murmur   . Hematuria - cause not known    resolved   . History of kidney stones    x 2 '13, '14 surgery to remove  .  Pneumonia    aspiration  04/2016  . Presence of permanent cardiac pacemaker   . SBO (small bowel obstruction) ([place])    rqueired admission 2020  . Thyroid disease    "goiter"  . Transfusion history    10 yrs+    PAST SURGICAL HISTORY: Past Surgical History:  Procedure Laterality Date  . ABDOMINAL ADHESION SURGERY  2004   open LOA - in [place]  . AV NODE ABLATION N/A 06/20/2019   Procedure: AV NODE ABLATION;  Surgeon: Evans Lance, MD;  Location: [place] [facility];  Service: Cardiovascular;  Laterality: N/A;  . BALLOON DILATION N/A 07/25/2019   Procedure: BALLOON DILATION;  Surgeon: Carol Ada, MD;  Location: [facility];  Service: Endoscopy;  Laterality: N/A;  . CARDIAC CATHETERIZATION  04/04/2010   No significant obstructive coronary artery disease  . CARDIOVERSION N/A 08/08/2017   Procedure: CARDIOVERSION;  Surgeon: Pixie Casino, MD;  Location: [facility];  Service: Cardiovascular;  Laterality: N/A;  . CARDIOVERSION N/A 06/18/2019   Procedure: CARDIOVERSION;  Surgeon: Pixie Casino, MD;  Location: [place];  Service: Cardiovascular;  Laterality: N/A;  . CHOLECYSTECTOMY  1990  . COLONOSCOPY W/ POLYPECTOMY    . COLONOSCOPY WITH PROPOFOL N/A 04/10/2014   Procedure: COLONOSCOPY WITH PROPOFOL;  Surgeon: Beryle Beams, MD;  Location: [facility];  Service: Endoscopy;  Laterality: N/A;  . CYSTOSCOPY/URETEROSCOPY/HOLMIUM LASER/STENT PLACEMENT Right 08/13/2019   Procedure: CYSTOSCOPY/RETROGRADE/URETEROSCOPY/HOLMIUM LASER/STENT PLACEMENT;  Surgeon: Ceasar Mons, MD;  Location: [facility];  Service: Urology;  Laterality: Right;  ONLY NEEDS 45 MIN  . ESOPHAGOGASTRODUODENOSCOPY (EGD) WITH PROPOFOL N/A 07/25/2019   Procedure: ESOPHAGOGASTRODUODENOSCOPY (EGD) WITH PROPOFOL;  Surgeon: Carol Ada, MD;  Location: [facility];  Service: Endoscopy;  Laterality: N/A;  . EYE SURGERY     lasik 20-25 yrs ago  . GASTROPLASTY VERTICAL BANDED  1985  . [place]   In [place] for SBO  . MALONEY DILATION  07/25/2019   Procedure: Venia Minks DILATION;  Surgeon: Carol Ada, MD;  Location: [facility];  Service: Endoscopy;;  . PACEMAKER IMPLANT N/A 06/20/2019   Procedure: PACEMAKER IMPLANT;  Surgeon: Evans Lance, MD;  Location: [place] [facility];  Service: Cardiovascular;  Laterality: N/A;  . REVERSE SHOULDER ARTHROPLASTY Right 03/29/2018   Procedure: REVERSE SHOULDER ARTHROPLASTY;  Surgeon: Netta Cedars, MD;  Location: [place];  Service: Orthopedics;  Laterality: Right;  . ROUX-EN-Y GASTRIC BYPASS  1992   Conversion VBG to RnYGB in [place], [place]  . SHOULDER ARTHROSCOPY WITH SUBACROMIAL DECOMPRESSION  2016   Dr Berenice Primas, [place], [place]  . SMALL INTESTINE SURGERY  2000   SBO - LOA w SB resection  . TOTAL KNEE ARTHROPLASTY Left 11/14/2018   Procedure: TOTAL KNEE ARTHROPLASTY;  Surgeon: Paralee Cancel, MD;  Location: [facility];  Service: Orthopedics;  Laterality: Left;  70 mins  . TOTAL KNEE ARTHROPLASTY Right 04/29/2019   Procedure: TOTAL KNEE ARTHROPLASTY;  Surgeon: Paralee Cancel, MD;  Location: [facility];  Service: Orthopedics;  Laterality: Right;  70 mins  . TOTAL THYROIDECTOMY  06/26/2012   Dr Celine Ahr, [facility]  .  TUBAL LIGATION  1986  . URETHRAL DILATION  2012   w cystoscopy.  Dr Gaynelle Arabian  . UTERINE FIBROID EMBOLIZATION      MEDICATIONS: Current Outpatient Medications on File Prior to Visit  Medication Sig Dispense Refill  . acetaminophen (TYLENOL) 650 MG CR tablet Take 650 mg by mouth every 8 (eight) hours as needed for pain.    Marland Kitchen albuterol (VENTOLIN HFA) 108 (90 Base) MCG/ACT inhaler Inhale 2 puffs into the lungs every 6 (six) hours as needed for wheezing or shortness of breath.    Marland Kitchen apixaban (ELIQUIS) 5 MG TABS tablet Take 1 tablet (5 mg total) by mouth 2 (two) times daily. Please resume eliquis on 5/5 pm, 30 tablet 0  . calcitRIOL (ROCALTROL) 0.25 MCG capsule Take 1 capsule (0.25 mcg total) by mouth daily. 90 capsule 3  .  clotrimazole (LOTRIMIN) 1 % cream APPLY TO AFFECTED AREA TWICE A DAY (Patient taking differently: Apply 1 application topically 2 (two) times daily as needed (affected area(s) of feet). ) 45 g 0  . cyanocobalamin (,VITAMIN B-12,) 1000 MCG/ML injection Inject 1 mL (1,000 mcg total) into the muscle every 30 (thirty) days. 1 mL 0  . cyclobenzaprine (FLEXERIL) 10 MG tablet Take 20 mg by mouth at bedtime.     . DULoxetine (CYMBALTA) 60 MG capsule Take 1 capsule (60 mg total) by mouth daily. 30 capsule 0  . eszopiclone (LUNESTA) 2 MG TABS tablet Take 1 tablet (2 mg total) by mouth at bedtime. 20 tablet 0  . Fluticasone Furoate (ARNUITY ELLIPTA) 100 MCG/ACT AEPB Inhale 1 puff into the lungs daily. 30 each 2  . FOLIC ACID PO Take 1 tablet by mouth daily.    . furosemide (LASIX) 20 MG tablet Take 1 tablet (20 mg total) by mouth as needed. (Patient taking differently: Take 20 mg by mouth daily as needed (fluid retention). ) 90 tablet 2  . HYDROmorphone (DILAUDID) 2 MG tablet Take 1 tablet (2 mg total) by mouth every 12 (twelve) hours as needed for up to 5 days for severe pain. 10 tablet 0  . levETIRAcetam (KEPPRA) 500 MG tablet Take 500 mg by mouth daily.    Marland Kitchen levothyroxine (SYNTHROID) 137 MCG tablet Take 1 tablet (137 mcg total) by mouth daily before breakfast. 45 tablet 3  . magnesium oxide (MAG-OX) 400 (241.3 Mg) MG tablet Take 400 mg by mouth daily.     . NONFORMULARY OR COMPOUNDED ITEM [facility]:  Antifungal Cream - Terbinafine 3%, Fluconazole 2%, Tea Tree Oil 5%, Urea 10%, Ibuprofen 2% in DMSO Suspension #107ml. Apply to affected toenail(s) once daily (at bedtime) or twice daily. (Patient taking differently: Apply 1 application topically 2 (two) times daily as needed (affected area(s) of feet.). [facility]:  Antifungal Cream - Terbinafine 3%, Fluconazole 2%, Tea Tree Oil 5%, Urea 10%, Ibuprofen 2% in DMSO Suspension #28ml. Apply to affected toenail(s) once daily (at bedtime) or twice  daily.) 30 each 11  . oxybutynin (DITROPAN) 5 MG tablet Take 1 tablet (5 mg total) by mouth every 8 (eight) hours as needed for bladder spasms. 30 tablet 1  . phenazopyridine (PYRIDIUM) 200 MG tablet Take 1 tablet (200 mg total) by mouth 3 (three) times daily as needed (for pain with urination). 30 tablet 0  . traZODone (DESYREL) 50 MG tablet Take 1 tablet (50 mg total) by mouth at bedtime. 30 tablet 0  . Vitamin D, Ergocalciferol, (DRISDOL) 1.25 MG (50000 UT) CAPS capsule Take 1 capsule (50,000 Units total) by mouth  every Wednesday. 4 capsule 0   No current facility-administered medications on file prior to visit.    ALLERGIES: Allergies  Allergen Reactions  . Hydrocodone Itching         FAMILY HISTORY: Family History  Problem Relation Age of Onset  . Diabetes Mother   . Epilepsy Mother   . Cancer Mother        Breast  . Hypertension Mother   . Breast cancer Mother   . Seizures Mother   . Kidney disease Father   . Diabetes Father   . Hypertension Father   . Asthma Father   . Heart disease Father   . Epilepsy Sister   . Cancer Maternal Grandmother   . Breast cancer Maternal Grandmother   . Cancer Paternal Grandmother   . Breast cancer Paternal Grandmother     SOCIAL HISTORY: Social History   Socioeconomic History  . Marital status: Married    Spouse name: Not on file  . Number of children: 3  . Years of education: Not on file  . Highest education level: Master's degree (e.g., MA, MS, MEng, MEd, MSW, MBA)  Occupational History  . Occupation: retired  Tobacco Use  . Smoking status: Never Smoker  . Smokeless tobacco: Never Used  Vaping Use  . Vaping Use: Never used  Substance and Sexual Activity  . Alcohol use: No    Comment: wine occ  . Drug use: No    Types: Oxycodone    Comment: perscribed  . Sexual activity: Not Currently    Birth control/protection: None  Other Topics Concern  . Not on file  Social History Narrative   Right handed   1 cup of  caffeine per day        Social Determinants of Health   Financial Resource Strain:   . Difficulty of Paying Living Expenses:   Food Insecurity:   . Worried About Charity fundraiser in the Last Year:   . Arboriculturist in the Last Year:   Transportation Needs:   . Film/video editor (Medical):   Marland Kitchen Lack of Transportation (Non-Medical):   Physical Activity:   . Days of Exercise per Week:   . Minutes of Exercise per Session:   Stress:   . Feeling of Stress :   Social Connections:   . Frequency of Communication with Friends and Family:   . Frequency of Social Gatherings with Friends and Family:   . Attends Religious Services:   . Active Member of Clubs or Organizations:   . Attends Archivist Meetings:   Marland Kitchen Marital Status:   Intimate Partner Violence:   . Fear of Current or Ex-Partner:   . Emotionally Abused:   Marland Kitchen Physically Abused:   . Sexually Abused:     PHYSICAL EXAM: Vitals:   08/15/19 1402  BP: 123/76  Pulse: 67  SpO2: 97%   General: No acute distress Head:  Normocephalic/atraumatic Skin/Extremities: No rash, no edema Neurological Exam: Mental status: alert and oriented to person, place, and time, no dysarthria or aphasia, Fund of knowledge is appropriate.  Recent and remote memory are intact. 2/3 delayed recall.  Attention and concentration are normal.  Cranial nerves: CN I: not tested CN II: pupils equal, round and reactive to light, visual fields intact CN III, IV, VI:  full range of motion, no nystagmus, no ptosis CN V: facial sensation intact CN VII: upper and lower face symmetric CN VIII: hearing intact to conversation Bulk & Tone: normal,  no fasciculations. Motor: 5/5 throughout with no pronator drift. Sensation: intact to light touch, cold, pin, vibration and joint position sense.  No extinction to double simultaneous stimulation.  Romberg test negative Deep Tendon Reflexes: +1 throughout Cerebellar: no incoordination on finger to nose  testing Gait: slow and cautious due to left knee pain Tremor: none   IMPRESSION: This is a pleasant 69 year old right-handed woman with a history of atrial fibrillation on Eliquis, s/p PPM, thyroid microcarcinoma, anemia, fibromyalgia, presenting for evaluation of seizures/medication. She had several seizures in one day in April 2020 with report of staring followed by convulsions, as well as right-sided shaking, indicating focal seizures with secondary generalization. MRI brain no acute changes, EEG showed diffuse slowing. There is a strong family history of seizures. There may have been a seizure in 2018 that was possibly provoked. Her neurological exam is normal. She has self-reduced Levetiracetam to 500mg  daily. Due to concern for Levetiracetam causing anemia, we discussed the possibility of weaning off LEV. She will be scheduled for a 1-hour EEG, if normal we will do a 24-hour EEG. If prolonged EEG normal, we will plan to wean off Levetiracetam. We discussed risks for breakthrough seizure with any medication adjustment, [place] driving laws were discussed with the patient, and she knows to stop driving after a seizure, until 6 months seizure-free. Follow-up after tests, she knows to call for any changes.   Thank you for allowing me to participate in the care of this patient. Please do not hesitate to call for any questions or concerns.   Ellouise Newer, M.D.  CC: Dr. Jana Hakim, Wilber Bihari, NP, Dr. Maia Petties

## 2019-08-15 NOTE — Patient Instructions (Signed)
1. Schedule 1-hour EEG, if normal, we will plan for a 24-hour EEG  2. Continue Keppra 500mg  daily for now, we will talk about medication plan after the EEG  3. Call for any changes  Seizure Precautions: 1. If medication has been prescribed for you to prevent seizures, take it exactly as directed.  Do not stop taking the medicine without talking to your doctor first, even if you have not had a seizure in a long time.   2. Avoid activities in which a seizure would cause danger to yourself or to others.  Don't operate dangerous machinery, swim alone, or climb in high or dangerous places, such as on ladders, roofs, or girders.  Do not drive unless your doctor says you may.  3. If you have any warning that you may have a seizure, lay down in a safe place where you can't hurt yourself.    4.  No driving for 6 months from last seizure, as per [facility]   Please refer to the following link on the [place] website for more information: http://www.epilepsyfoundation.org/answerplace/Social/driving/drivingu.cfm   5.  Maintain good sleep hygiene. Avoid alcohol.  6.  Contact your doctor if you have any problems that may be related to the medicine you are taking.  7.  Call 911 and bring the patient back to the ED if:        A.  The seizure lasts longer than 5 minutes.       B.  The patient doesn't awaken shortly after the seizure  C.  The patient has new problems such as difficulty seeing, speaking or moving  D.  The patient was injured during the seizure  E.  The patient has a temperature over 102 F (39C)  F.  The patient vomited and now is having trouble breathing

## 2019-08-18 ENCOUNTER — Other Ambulatory Visit: Payer: Self-pay

## 2019-08-18 ENCOUNTER — Ambulatory Visit (INDEPENDENT_AMBULATORY_CARE_PROVIDER_SITE_OTHER): Payer: Medicare Other | Admitting: Neurology

## 2019-08-18 DIAGNOSIS — R569 Unspecified convulsions: Secondary | ICD-10-CM | POA: Diagnosis not present

## 2019-08-19 ENCOUNTER — Encounter: Payer: Self-pay | Admitting: Podiatry

## 2019-08-19 ENCOUNTER — Ambulatory Visit (INDEPENDENT_AMBULATORY_CARE_PROVIDER_SITE_OTHER): Payer: Medicare Other | Admitting: Podiatry

## 2019-08-19 ENCOUNTER — Other Ambulatory Visit: Payer: Self-pay

## 2019-08-19 DIAGNOSIS — M79674 Pain in right toe(s): Secondary | ICD-10-CM | POA: Diagnosis not present

## 2019-08-19 DIAGNOSIS — M79675 Pain in left toe(s): Secondary | ICD-10-CM | POA: Diagnosis not present

## 2019-08-19 DIAGNOSIS — L84 Corns and callosities: Secondary | ICD-10-CM

## 2019-08-19 DIAGNOSIS — B351 Tinea unguium: Secondary | ICD-10-CM | POA: Diagnosis not present

## 2019-08-19 DIAGNOSIS — M2041 Other hammer toe(s) (acquired), right foot: Secondary | ICD-10-CM

## 2019-08-19 DIAGNOSIS — M2042 Other hammer toe(s) (acquired), left foot: Secondary | ICD-10-CM

## 2019-08-19 DIAGNOSIS — D510 Vitamin B12 deficiency anemia due to intrinsic factor deficiency: Secondary | ICD-10-CM

## 2019-08-20 ENCOUNTER — Other Ambulatory Visit: Payer: Medicare Other

## 2019-08-20 ENCOUNTER — Other Ambulatory Visit (INDEPENDENT_AMBULATORY_CARE_PROVIDER_SITE_OTHER): Payer: Medicare Other

## 2019-08-20 DIAGNOSIS — E890 Postprocedural hypothyroidism: Secondary | ICD-10-CM | POA: Diagnosis not present

## 2019-08-20 LAB — TSH: TSH: 0.53 u[IU]/mL (ref 0.35–4.50)

## 2019-08-20 LAB — T4, FREE: Free T4: 1.11 ng/dL (ref 0.60–1.60)

## 2019-08-21 ENCOUNTER — Ambulatory Visit: Payer: Medicare Other | Admitting: Neurology

## 2019-08-21 ENCOUNTER — Other Ambulatory Visit: Payer: Self-pay | Admitting: Internal Medicine

## 2019-08-21 LAB — CALCIUM, IONIZED: Calcium, Ion: 4.09 mg/dL — ABNORMAL LOW (ref 4.8–5.6)

## 2019-08-22 ENCOUNTER — Telehealth: Payer: Self-pay

## 2019-08-22 NOTE — Procedures (Signed)
ELECTROENCEPHALOGRAM REPORT  Date of Study: 08/18/2019  Patient's Name: Jillian Eaton [facility] MRN: [id number] Date of Birth: 12-Mar-1950  Referring Provider: Dr. Ellouise Newer  Clinical History: This is a 69 year old woman with new onset seizure 05/26/2018.  Strong family history of seizures.  Medications: KEPPRA 500 MG tablet TYLENOL 650 MG CR tablet VENTOLIN HFA 108 (90 Base) MCG/ACT inhaler ELIQUIS 5 MG TABS tablet ROCALTROL 0.25 MCG capsule LOTRIMIN 1 % cream VITAMIN B-12, 1000 MCG/ML injection FLEXERIL 10 MG tablet CYMBALTA 60 MG capsule LUNESTA 2 MG TABS tablet ARNUITY ELLIPTA 100 MCG/ACT AEPB LASIX 20 MG tablet DILAUDID 2 MG tablet SYNTHROID 137 MCG tablet MAG-OX 400 (241.3 Mg) MG tablet Antifungal Cream - Terbinafine 3%, Fluconazole 2%, Tea Tree Oil 5%, Urea 10%, Ibuprofen 2% in DMSO Suspension #107ml. DITROPAN 5 MG tablet PYRIDIUM 200 MG tablet DESYREL 50 MG tablet DRISDOL 1.25 MG (50000 UT) CAPS    Technical Summary: A multichannel digital 1-hour EEG recording measured by the international 10-20 system with electrodes applied with paste and impedances below 5000 ohms performed in our laboratory with EKG monitoring in an awake and asleep patient.  Hyperventilation was not performed. Photic stimulation was performed.  The digital EEG was referentially recorded, reformatted, and digitally filtered in a variety of bipolar and referential montages for optimal display.    Description: The patient is awake and asleep during the recording.  During maximal wakefulness, there is a symmetric, medium voltage 9.5 Hz posterior dominant rhythm that attenuates with eye opening.  The record is symmetric.  During drowsiness and sleep, there is an increase in theta slowing of the background, at times sharply contoured over the left temporal region without clear epileptogenic potential.  Vertex waves and symmetric sleep spindles were seen.  Hyperventilation and photic stimulation did not  elicit any abnormalities.  There were no clear epileptiform discharges or electrographic seizures seen.    EKG lead was unremarkable.  Impression: This 1-hour awake and asleep EEG is within normal limits.  Clinical Correlation: A normal EEG does not exclude a clinical diagnosis of epilepsy.  If further clinical questions remain, prolonged EEG may be helpful.  Clinical correlation is advised.   Ellouise Newer, M.D.

## 2019-08-22 NOTE — Telephone Encounter (Signed)
-----   Message from Cameron Sprang, MD sent at 08/22/2019 10:50 AM EDT ----- Pls let her know the EEG is normal, however it is not like a pregnancy test that is positive or negative, it is just a snapshot of her brain waves. Proceed with 24-hour EEG as discussed, thanks

## 2019-08-22 NOTE — Telephone Encounter (Signed)
Pt called and informed that EEG is normal, however it is not like a pregnancy test that is positive or negative, it is just a snapshot of her brain waves. Proceed with 24-hour EEG as discussed. Pt verbalized understanding

## 2019-08-23 NOTE — Progress Notes (Signed)
Subjective:  Patient ID: Jillian Eaton, female    DOB: [birth year],  MRN: [id number]  69 y.o. female presents with painful corn(s) b/l feet and painful thick toenails that are difficult to trim. Pain interferes with ambulation. Aggravating factors include wearing enclosed shoe gear. Pain is relieved with periodic professional debridement.   [name], MD is her PCP. Last visit was 08/07/2019.  She has h/o pernicious anemia. Receives B-12 injections.  Review of Systems: Negative except as noted in the HPI. Denies N/V/F/Ch. Past Medical History:  Diagnosis Date  . Anemia    iron and pernicious  . Arthritis   . CHF (congestive heart failure) ([place]) 2018  . Complication of anesthesia    BP dropped   . DOE (dyspnea on exertion)    2D ECHO, 02/12/2012 - EF 60-65%, moderate concentric hypertrophy  . Dyspnea   . Dysrhythmia    a-fib  . Fibromyalgia    nerve pain"left side at waist level" "can't lay on that side without pain" , "HOB elevation helps"; pt. thinks this has resolved after 200lb weight loss9-17- since i lost the wieght , i dont  think i have this anymore   . Headache 04/2019  . Heart murmur   . Hematuria - cause not known    resolved   . History of kidney stones    x 2 '13, '14 surgery to remove  . Pneumonia    aspiration  04/2016  . Presence of permanent cardiac pacemaker   . SBO (small bowel obstruction) ([place])    rqueired admission 2020  . Thyroid disease    "goiter"  . Transfusion history    10 yrs+   Past Surgical History:  Procedure Laterality Date  . ABDOMINAL ADHESION SURGERY  2004   open LOA - in [place]  . AV NODE ABLATION N/A 06/20/2019   Procedure: AV NODE ABLATION;  Surgeon: Evans Lance, MD;  Location: [facility];  Service: Cardiovascular;  Laterality: N/A;  . BALLOON DILATION N/A 07/25/2019   Procedure: BALLOON DILATION;  Surgeon: Carol Ada, MD;  [facility];  Service: Endoscopy;  Laterality: N/A;  . CARDIAC  CATHETERIZATION  04/04/2010   No significant obstructive coronary artery disease  . CARDIOVERSION N/A 08/08/2017   Procedure: CARDIOVERSION;  Surgeon: Pixie Casino, MD;  Location: [facility];  Service: Cardiovascular;  Laterality: N/A;  . CARDIOVERSION N/A 06/18/2019   Procedure: CARDIOVERSION;  Surgeon: Pixie Casino, MD;  Location: [facility];  Service: Cardiovascular;  Laterality: N/A;  . CHOLECYSTECTOMY  1990  . COLONOSCOPY W/ POLYPECTOMY    . COLONOSCOPY WITH PROPOFOL N/A 04/10/2014   Procedure: COLONOSCOPY WITH PROPOFOL;  Surgeon: Beryle Beams, MD;  [facility];  Service: Endoscopy;  Laterality: N/A;  . CYSTOSCOPY/URETEROSCOPY/HOLMIUM LASER/STENT PLACEMENT Right 08/13/2019   Procedure: CYSTOSCOPY/RETROGRADE/URETEROSCOPY/HOLMIUM LASER/STENT PLACEMENT;  Surgeon: Ceasar Mons, MD;  [facility];  Service: Urology;  Laterality: Right;  ONLY NEEDS 45 MIN  . ESOPHAGOGASTRODUODENOSCOPY (EGD) WITH PROPOFOL N/A 07/25/2019   Procedure: ESOPHAGOGASTRODUODENOSCOPY (EGD) WITH PROPOFOL;  Surgeon: Carol Ada, MD;  [facility];  Service: Endoscopy;  Laterality: N/A;  . EYE SURGERY     lasik 20-25 yrs ago  . GASTROPLASTY VERTICAL BANDED  1985  . [place]   In [place] for SBO  . MALONEY DILATION  07/25/2019   Procedure: Venia Minks DILATION;  Surgeon: Carol Ada, MD;  [facility];  Service: Endoscopy;;  . PACEMAKER IMPLANT N/A 06/20/2019  Procedure: PACEMAKER IMPLANT;  Surgeon: Evans Lance, MD;  Location: [facility];  Service: Cardiovascular;  Laterality: N/A;  . REVERSE SHOULDER ARTHROPLASTY Right 03/29/2018   Procedure: REVERSE SHOULDER ARTHROPLASTY;  Surgeon: Netta Cedars, MD;  Location: [facility];  Service: Orthopedics;  Laterality: Right;  . ROUX-EN-Y GASTRIC BYPASS  1992   Conversion VBG to RnYGB in [place], [place]  . SHOULDER ARTHROSCOPY WITH SUBACROMIAL DECOMPRESSION  2016   Dr Berenice Primas,  [place], [place]  . SMALL INTESTINE SURGERY  2000   SBO - LOA w SB resection  . TOTAL KNEE ARTHROPLASTY Left 11/14/2018   Procedure: TOTAL KNEE ARTHROPLASTY;  Surgeon: Paralee Cancel, MD;  [facility];  Service: Orthopedics;  Laterality: Left;  70 mins  . TOTAL KNEE ARTHROPLASTY Right 04/29/2019   Procedure: TOTAL KNEE ARTHROPLASTY;  Surgeon: Paralee Cancel, MD;  [facility];  Service: Orthopedics;  Laterality: Right;  70 mins  . TOTAL THYROIDECTOMY  06/26/2012   Dr Celine Ahr, [facility]  . TUBAL LIGATION  1986  . URETHRAL DILATION  2012   w cystoscopy.  Dr Gaynelle Arabian  . UTERINE FIBROID EMBOLIZATION     Patient Active Problem List   Diagnosis Date Noted  . Atrial fibrillation with RVR ([place]) 06/15/2019  . Fatigue 06/14/2019  . S/P right TKA 04/29/2019  . Medication management 03/11/2019  . Stiffness of left knee 12/12/2018  . Acute on chronic respiratory failure with hypoxia ([place]) 12/07/2018  . Normocytic anemia 12/07/2018  . Asthma 12/07/2018  . S/P left TKA 11/14/2018  . Status post total left knee replacement 11/14/2018  . Degenerative joint disease involving multiple joints on both sides of body 07/31/2018  . Rotator cuff tear arthropathy of right shoulder 04/05/2018  . Overactive bladder 04/05/2018  . Steroid-induced hyperglycemia   . Generalized OA   . 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 NICM (nonischemic cardiomyopathy) ([place]) 01/15/2018  . Osteoporosis 12/13/2017  . Rotator cuff tear arthropathy 11/29/2017  . Chronic diastolic CHF (congestive heart failure) ([place]) 09/28/2017  . Acute renal failure superimposed on stage 3 chronic kidney disease ([place]) 09/07/2017  . Hypomagnesemia with secondary hypocalcemia 09/07/2017  . 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 Hypercapnemia 07/13/2017  . AKI (acute kidney injury) ([place]) 07/10/2017  . Vertigo 07/08/2017  . Blurring of visual image 07/08/2017  . On anticoagulant therapy 06/19/2017  . Chronic  respiratory failure with hypoxia ([place]) 04/19/2017  . GERD (gastroesophageal reflux disease) 08/28/2016  . 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 Depressive disorder 08/28/2016  . Iatrogenic hypocalcemia 08/28/2016  . Chronic pain syndrome   . Acute diastolic (congestive) heart failure ([place])   . Oropharyngeal dysphagia   . Chronic atrial fibrillation ([place]) 04/28/2016  . Vocal cord dysfunction 04/28/2016  . Thrombocytopenia ([place]) 04/28/2016  . Seizures ([place])   . Preoperative cardiovascular examination   . 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  . Leg swelling 10/19/2015  . Acute on chronic respiratory failure with hypoxia and hypercapnia ([place]) 08/03/2015  . Bilateral leg edema 05/11/2015  . Essential hypertension 05/11/2015  . Nephrolithiasis 04/12/2015  . Numbness in both hands 04/12/2015  . Complete tear of left rotator cuff 11/19/2014  . Primary osteoarthritis of both knees 03/24/2014  . Chronic asthmatic bronchitis ([place]) 07/30/2013  . Bariatric surgery status 07/30/2013  . Urge incontinence of urine 07/30/2013  . Vitamin D deficiency 07/30/2013  . B-complex deficiency 07/30/2013  . Cough 07/30/2013  . Gross hematuria 07/30/2013  . Palpitations 07/30/2013  . Right flank pain 07/30/2013  .  SBO (small bowel obstruction) ([place]) 06/07/2013  . Pulmonary HTN ([place]) 01/08/2013  . Insomnia 03/22/2012  . Mild intermittent asthma with acute exacerbation 03/22/2012  . Fibromyalgia 10/26/2011  . Anemia of chronic disease 03/28/2011  . Obesity (BMI 30-39.9) 09/22/2010    Current Outpatient Medications:  .  acetaminophen (TYLENOL) 650 MG CR tablet, Take 650 mg by mouth every 8 (eight) hours as needed for pain., Disp: , Rfl:  .  albuterol (VENTOLIN HFA) 108 (90 Base) MCG/ACT inhaler, Inhale 2 puffs into the lungs every 6 (six) hours as needed for wheezing or shortness of breath., Disp: , Rfl:  .  apixaban (ELIQUIS) 5 MG TABS tablet, Take 1 tablet (5 mg total) by mouth 2 (two) times  daily. Please resume eliquis on 5/5 pm,, Disp: 30 tablet, Rfl: 0 .  calcitRIOL (ROCALTROL) 0.25 MCG capsule, Take 1 capsule (0.25 mcg total) by mouth daily., Disp: 90 capsule, Rfl: 3 .  clotrimazole (LOTRIMIN) 1 % cream, APPLY TO AFFECTED AREA TWICE A DAY (Patient taking differently: Apply 1 application topically 2 (two) times daily as needed (affected area(s) of feet). ), Disp: 45 g, Rfl: 0 .  cyanocobalamin (,VITAMIN B-12,) 1000 MCG/ML injection, Inject 1 mL (1,000 mcg total) into the muscle every 30 (thirty) days., Disp: 1 mL, Rfl: 0 .  cyclobenzaprine (FLEXERIL) 10 MG tablet, Take 20 mg by mouth at bedtime. , Disp: , Rfl:  .  DULoxetine (CYMBALTA) 60 MG capsule, Take 1 capsule (60 mg total) by mouth daily., Disp: 30 capsule, Rfl: 0 .  eszopiclone (LUNESTA) 2 MG TABS tablet, Take 1 tablet (2 mg total) by mouth at bedtime., Disp: 20 tablet, Rfl: 0 .  Fluticasone Furoate (ARNUITY ELLIPTA) 100 MCG/ACT AEPB, Inhale 1 puff into the lungs daily., Disp: 30 each, Rfl: 2 .  FOLIC ACID PO, Take 1 tablet by mouth daily., Disp: , Rfl:  .  furosemide (LASIX) 20 MG tablet, Take 1 tablet (20 mg total) by mouth as needed. (Patient taking differently: Take 20 mg by mouth daily as needed (fluid retention). ), Disp: 90 tablet, Rfl: 2 .  levETIRAcetam (KEPPRA) 500 MG tablet, Take 500 mg by mouth daily., Disp: , Rfl:  .  levothyroxine (SYNTHROID) 137 MCG tablet, Take 1 tablet (137 mcg total) by mouth daily before breakfast., Disp: 45 tablet, Rfl: 3 .  magnesium oxide (MAG-OX) 400 (241.3 Mg) MG tablet, Take 400 mg by mouth daily. , Disp: , Rfl:  .  NONFORMULARY OR COMPOUNDED ITEM, [facility]:  Antifungal Cream - Terbinafine 3%, Fluconazole 2%, Tea Tree Oil 5%, Urea 10%, Ibuprofen 2% in DMSO Suspension #35ml. Apply to affected toenail(s) once daily (at bedtime) or twice daily. (Patient taking differently: Apply 1 application topically 2 (two) times daily as needed (affected area(s) of feet.). [facility]:   Antifungal Cream - Terbinafine 3%, Fluconazole 2%, Tea Tree Oil 5%, Urea 10%, Ibuprofen 2% in DMSO Suspension #51ml. Apply to affected toenail(s) once daily (at bedtime) or twice daily.), Disp: 30 each, Rfl: 11 .  oxybutynin (DITROPAN) 5 MG tablet, Take 1 tablet (5 mg total) by mouth every 8 (eight) hours as needed for bladder spasms., Disp: 30 tablet, Rfl: 1 .  phenazopyridine (PYRIDIUM) 200 MG tablet, Take 1 tablet (200 mg total) by mouth 3 (three) times daily as needed (for pain with urination)., Disp: 30 tablet, Rfl: 0 .  traZODone (DESYREL) 50 MG tablet, Take 1 tablet (50 mg total) by mouth at bedtime., Disp: 30 tablet, Rfl: 0 .  Vitamin D, Ergocalciferol, (DRISDOL)  1.25 MG (50000 UT) CAPS capsule, Take 1 capsule (50,000 Units total) by mouth every Wednesday., Disp: 4 capsule, Rfl: 0 Allergies  Allergen Reactions  . Hydrocodone Itching        Social History   Tobacco Use  Smoking Status Never Smoker  Smokeless Tobacco Never Used    Objective:   Constitutional Pt is a pleasant 69 y.o. African American female, in NAD.Marland Kitchen  Vascular Capillary refill time to digits immediate b/l. Palpable pedal pulses b/l LE. Pedal hair absent. Lower extremity skin temperature gradient within normal limits. No cyanosis or clubbing noted.  Neurologic Normal speech. Oriented to person, place, and time. Protective sensation intact 5/5 intact bilaterally with 10g monofilament b/l. Vibratory sensation intact b/l.  Dermatologic Pedal skin with normal turgor, texture and tone bilaterally. No open wounds bilaterally. No interdigital macerations bilaterally. Toenails 1-5 b/l elongated, discolored, dystrophic, thickened, crumbly with subungual debris and tenderness to dorsal palpation. Hyperkeratotic lesion(s) L 5th toe and R 5th toe.  No erythema, no edema, no drainage, no flocculence.  Orthopedic: Normal muscle strength 5/5 to all lower extremity muscle groups bilaterally. No pain crepitus or joint limitation noted  with ROM b/l. Hammertoes noted to the L 5th toe and R 5th toe.   Radiographs: None Assessment:   1. Pain due to onychomycosis of toenails of both feet   2. Corns   3. Pernicious anemia   4. Acquired hammertoes of both feet    Plan:  Patient was evaluated and treated and all questions answered.  Onychomycosis with pain -Nails palliatively debridement as below.  Procedure: Nail Debridement Rationale: Pain Type of Debridement: manual, sharp debridement. Instrumentation: Nail nipper, rotary burr. Number of Nails: 10  -Examined patient. -Toenails 1-5 b/l were debrided in length and girth with sterile nail nippers and dremel without iatrogenic bleeding.  -Corn(s) L 5th toe and R 5th toe pared utilizing sterile scalpel blade without complication or incident. Total number debrided=2. -Patient to continue soft, supportive shoe gear daily. -Patient/POA to call should there be question/concern in the interim.  Return in about 3 months (around 11/19/2019) for nail and callus trim.  Marzetta Board, DPM

## 2019-08-27 ENCOUNTER — Other Ambulatory Visit: Payer: Self-pay

## 2019-08-27 ENCOUNTER — Ambulatory Visit (INDEPENDENT_AMBULATORY_CARE_PROVIDER_SITE_OTHER): Payer: Medicare Other | Admitting: Neurology

## 2019-08-27 DIAGNOSIS — R569 Unspecified convulsions: Secondary | ICD-10-CM

## 2019-08-27 DIAGNOSIS — N201 Calculus of ureter: Secondary | ICD-10-CM | POA: Diagnosis not present

## 2019-09-02 ENCOUNTER — Telehealth: Payer: Self-pay | Admitting: Oncology

## 2019-09-02 NOTE — Telephone Encounter (Signed)
Scheduled appt per 7/13 sch msg - pt is aware of appt date and time

## 2019-09-07 NOTE — Procedures (Signed)
ELECTROENCEPHALOGRAM REPORT  Dates of Recording: 08/27/2019 7:57AM to 08/28/2019 8:10AM  Patient's Name: Jillian Eaton MRN: [id number] Date of Birth: [birth year]  Referring Provider: Dr. Ellouise Newer  Procedure: 24-hour ambulatory video EEG  History: This is a 69 year old woman with convulsion in April 2020 in the setting of febrile illness. She may have had another seizure in 2018. EEG to classify seizures.   Medications:  KEPPRA 500 MG tablet TYLENOL 650 MG CR tablet VENTOLIN HFA 108 (90 Base) MCG/ACT inhaler ELIQUIS 5 MG TABS tablet ROCALTROL 0.25 MCG capsule LOTRIMIN 1 % cream VITAMIN B-12, 1000 MCG/ML injection FLEXERIL 10 MG tablet CYMBALTA 60 MG capsule LUNESTA 2 MG TABS tablet ARNUITY ELLIPTA 100 MCG/ACT AEPB LASIX 20 MG tablet DILAUDID 2 MG tablet SYNTHROID 137 MCG tablet MAG-OX 400 (241.3 Mg) MG tablet Antifungal Cream - Terbinafine 3%, Fluconazole 2%, Tea Tree Oil 5%, Urea 10%, Ibuprofen 2% in DMSO Suspension #5ml. DITROPAN 5 MG tablet PYRIDIUM 200 MG tablet DESYREL 50 MG tablet DRISDOL 1.25 MG (50000 UT) CAPS    Technical Summary: This is a 24-hour multichannel digital video EEG recording measured by the international 10-20 system with electrodes applied with paste and impedances below 5000 ohms performed as portable with EKG monitoring.  The digital EEG was referentially recorded, reformatted, and digitally filtered in a variety of bipolar and referential montages for optimal display.    DESCRIPTION OF RECORDING: During maximal wakefulness, the background activity consisted of a symmetric 10 Hz posterior dominant rhythm which was reactive to eye opening.  There were no epileptiform discharges or focal slowing seen in wakefulness.  During the recording, the patient progresses through wakefulness, drowsiness, and Stage 2 sleep. During drowsiness, there is an increase in theta slowing, at times sharply contoured over the left temporal region without clear  epileptogenic potential. Again, there were no epileptiform discharges seen.  Events: There were no push button events.   There were no electrographic seizures seen.  EKG lead showed sinus bradycardia at 60 bpm.  IMPRESSION: This 24-hour ambulatory video EEG study is normal.     CLINICAL CORRELATION: A normal EEG does not exclude a clinical diagnosis of epilepsy.  If further clinical questions remain, inpatient video EEG monitoring may be helpful.   Ellouise Newer, M.D.

## 2019-09-08 ENCOUNTER — Telehealth: Payer: Self-pay | Admitting: Neurology

## 2019-09-08 NOTE — Telephone Encounter (Signed)
Spoke to patient about normal 1-hour and 24-hour EEG. She is on a low dose of Keppra 500mg  daily. We discussed weaning down to 1 tab every other night for a week, then stop. We discussed risks for breakthrough seizure with any medication adjustment, if seizures recur, will need to restart Keppra. Patient expressed understanding. Discussed [place] driving laws, would hold off on driving as we wean off medication, no driving after a seizure until 6 months seizure-free. Follow-up in 4 months, she knows to call for any changes.

## 2019-09-08 NOTE — Telephone Encounter (Signed)
Pt is sch °

## 2019-09-12 ENCOUNTER — Encounter: Payer: Self-pay | Admitting: Pulmonary Disease

## 2019-09-12 ENCOUNTER — Ambulatory Visit (INDEPENDENT_AMBULATORY_CARE_PROVIDER_SITE_OTHER): Payer: Medicare Other | Admitting: Pulmonary Disease

## 2019-09-12 ENCOUNTER — Other Ambulatory Visit: Payer: Self-pay

## 2019-09-12 VITALS — BP 120/82 | HR 59 | Temp 98.7°F | Wt 205.5 lb

## 2019-09-12 DIAGNOSIS — D519 Vitamin B12 deficiency anemia, unspecified: Secondary | ICD-10-CM | POA: Diagnosis not present

## 2019-09-12 DIAGNOSIS — I10 Essential (primary) hypertension: Secondary | ICD-10-CM | POA: Diagnosis not present

## 2019-09-12 DIAGNOSIS — K219 Gastro-esophageal reflux disease without esophagitis: Secondary | ICD-10-CM | POA: Diagnosis not present

## 2019-09-12 DIAGNOSIS — E8770 Fluid overload, unspecified: Secondary | ICD-10-CM | POA: Diagnosis not present

## 2019-09-12 DIAGNOSIS — R0609 Other forms of dyspnea: Secondary | ICD-10-CM

## 2019-09-12 DIAGNOSIS — D509 Iron deficiency anemia, unspecified: Secondary | ICD-10-CM | POA: Diagnosis not present

## 2019-09-12 DIAGNOSIS — I890 Lymphedema, not elsewhere classified: Secondary | ICD-10-CM | POA: Diagnosis not present

## 2019-09-12 DIAGNOSIS — J4540 Moderate persistent asthma, uncomplicated: Secondary | ICD-10-CM

## 2019-09-12 DIAGNOSIS — E538 Deficiency of other specified B group vitamins: Secondary | ICD-10-CM | POA: Diagnosis not present

## 2019-09-12 DIAGNOSIS — R0600 Dyspnea, unspecified: Secondary | ICD-10-CM

## 2019-09-12 DIAGNOSIS — I272 Pulmonary hypertension, unspecified: Secondary | ICD-10-CM

## 2019-09-12 DIAGNOSIS — G40909 Epilepsy, unspecified, not intractable, without status epilepticus: Secondary | ICD-10-CM | POA: Diagnosis not present

## 2019-09-12 MED ORDER — MONTELUKAST SODIUM 10 MG PO TABS: 10.0000 mg | ORAL_TABLET | Freq: Every day | ORAL | 5 refills | Status: DC

## 2019-09-12 NOTE — Patient Instructions (Signed)
Stop arnuity  Start taking montelukast (singulair) 10 mg pill nightly  Follow up in 4 months

## 2019-09-12 NOTE — Progress Notes (Signed)
[facility]  Chief Complaint  Patient presents with  . Follow-up    still having SOB with activity, some swelling in her lower extremeties    Constitutional:  BP 120/82   Pulse 59   Temp 98.7 F (37.1 C) (Oral)   Wt (!) 205 lb 8 oz (93.2 kg)   SpO2 99%   BMI 32.19 kg/m   Past Medical History:  Anemia, OA, CKD, Depression, Fibromyalgia, GERD, HTN, Hypothyroidism, Nephrolithiasis, Migraine HA, Osteopenia, Unilateral vocal cord paralysis 2017, PAF, Combined CHF, Seizure 2018, CKD 3  Brief Summary:  Jillian Eaton is a 69 y.o. female with dyspnea on exertion.  Subjective:  She had ONO with RA.  Showed mild oxygen desaturation.  She doesn't feel like she has issues with her sleep and had sleep study few years ago.  She doesn't feel like she needs another sleep study at this time.  She still gets winded with activity.  She is busy with activity at home, but not able to do sustained exercise.  She had to uses rescue inhaler frequent during trip to [place] at beginning of July.  Hasn't needed it as much since.  Not having cough, wheeze, sputum, fever.  Hb from 08/07/19 was 9.9  Physical Exam:   Appearance - well kempt   ENMT - no sinus tenderness, no oral exudate, no LAN, Mallampati 3 airway, no stridor  Respiratory - equal breath sounds bilaterally, no wheezing or rales  CV - s1s2 regular rate and rhythm, no murmurs  Ext - no clubbing, no edema  Skin - no rashes  Psych - normal mood and affect   Discussion:  She has continue dyspnea at rest and with exertion.  PFT from January 2021 was relatively unrevealing, and she has not had significant benefit from inhaler therapy.  Recent CT chest showed only Rt lower lung scarring, but no other evidence for intrinsic lung disease.  Recent Echo showed elevated pulmonary and right sided pressures.  She had recent overnight oximetry that showed mild oxygen desaturation, but she  doesn't feel that she needs to have sleep testing again at this time.  She likely has a component of deconditioning.  Assessment/Plan:   Dyspnea on exertion. - likely from diastolic CHF, anemia, asthma, and deconditioning - discussed starting a gradual exercise program  Mild, persistent asthma. - she is concerned about long term ICS use - will d/c arnuity and have her try singulair - continue prn albuterol  History of sleep disordered breathing. - recent overnight oximetry showed some oxygen desaturation - she opted against additional sleep testing at this time  Atrial fibrillation, chronic combined CHF, HTN. - she has f/u with Dr. [name]   Anemia. - followed by oncology for iron infusions  A total of  34 minutes spent addressing patient care issues on day of visit.    Follow up:   Patient Instructions  Stop arnuity  Start taking montelukast (singulair) 10 mg pill nightly  Follow up in 4 months   Signature:  Chesley Mires, MD [place] Pager: [phone] 09/12/2019, 10:01 AM  Flow Sheet     Pulmonary tests:   PFT 10/19/15 >> FEV1 1.38 (60%), FEV1% 82, TLC 3.34 (60%), DLCO 74%, +BD  ABG 07/08/16 >> pH 7.38, PCO2 51.9, PO2 70  PFT 03/11/19 >> FEV1 1.44 (65%), FEV1% 83, TLC 3.97 (72%), DLCO 72%, +BD  Chest imaging:   CT angio chest 12/06/18 >> enlarged PA, CM, mosaic attenuation with GGO  RUL  CT chest 06/14/19 >> scarring RLL  Sleep tests:   ONO with RA 07/04/19 >> test time 7 hrs 30 min.  Basal SpO2 93.4%, low SpO2 52%.  Spent 18.5 min with SpO2 < 88%.  Cardiac tests:   Echo 06/15/19 >> EF 60 to 65%, RVSP 50.7, mod LA/RA dilation, mod TR   Medications:   Allergies as of 09/12/2019      Reactions   Hydrocodone Itching         Medication List       Accurate as of September 12, 2019 10:01 AM. If you have any questions, ask your nurse or doctor.        STOP taking these medications   acetaminophen 650 MG CR tablet Commonly known  as: TYLENOL Stopped by: Chesley Mires, MD   Arnuity Ellipta 100 MCG/ACT Aepb Generic drug: Fluticasone Furoate Stopped by: Chesley Mires, MD   oxybutynin 5 MG tablet Commonly known as: DITROPAN Stopped by: Chesley Mires, MD   phenazopyridine 200 MG tablet Commonly known as: Pyridium Stopped by: Chesley Mires, MD     TAKE these medications   albuterol 108 (90 Base) MCG/ACT inhaler Commonly known as: VENTOLIN HFA Inhale 2 puffs into the lungs every 6 (six) hours as needed for wheezing or shortness of breath.   apixaban 5 MG Tabs tablet Commonly known as: Eliquis Take 1 tablet (5 mg total) by mouth 2 (two) times daily. Please resume eliquis on 5/5 pm,   calcitRIOL 0.25 MCG capsule Commonly known as: ROCALTROL Take 1 capsule (0.25 mcg total) by mouth daily.   clotrimazole 1 % cream Commonly known as: LOTRIMIN APPLY TO AFFECTED AREA TWICE A DAY What changed: See the new instructions.   cyanocobalamin 1000 MCG/ML injection Commonly known as: (VITAMIN B-12) Inject 1 mL (1,000 mcg total) into the muscle every 30 (thirty) days.   cyclobenzaprine 10 MG tablet Commonly known as: FLEXERIL Take 20 mg by mouth at bedtime.   DULoxetine 60 MG capsule Commonly known as: CYMBALTA Take 1 capsule (60 mg total) by mouth daily.   eszopiclone 2 MG Tabs tablet Commonly known as: LUNESTA Take 1 tablet (2 mg total) by mouth at bedtime.   FOLIC ACID PO Take 1 tablet by mouth daily.   furosemide 20 MG tablet Commonly known as: LASIX Take 1 tablet (20 mg total) by mouth as needed. What changed:   when to take this  reasons to take this   levETIRAcetam 500 MG tablet Commonly known as: KEPPRA Take 500 mg by mouth daily.   levothyroxine 137 MCG tablet Commonly known as: SYNTHROID Take 1 tablet (137 mcg total) by mouth daily before breakfast.   magnesium oxide 400 (241.3 Mg) MG tablet Commonly known as: MAG-OX Take 400 mg by mouth daily.   montelukast 10 MG tablet Commonly known as:  SINGULAIR Take 1 tablet (10 mg total) by mouth at bedtime. Started by: Chesley Mires, MD   NONFORMULARY OR COMPOUNDED ITEM [facility]:  Antifungal Cream - Terbinafine 3%, Fluconazole 2%, Tea Tree Oil 5%, Urea 10%, Ibuprofen 2% in DMSO Suspension #22ml. Apply to affected toenail(s) once daily (at bedtime) or twice daily. What changed:   how much to take  how to take this  when to take this  reasons to take this   traZODone 50 MG tablet Commonly known as: DESYREL Take 1 tablet (50 mg total) by mouth at bedtime.   Vitamin D (Ergocalciferol) 1.25 MG (50000 UNIT) Caps capsule Commonly known as: DRISDOL Take 1  capsule (50,000 Units total) by mouth every Wednesday.       Past Surgical History:  She  has a past surgical history that includes Cholecystectomy (1990); Tubal ligation (1986); Cardiac catheterization (04/04/2010); Colonoscopy w/ polypectomy; Colonoscopy with propofol (N/A, 04/10/2014); Cardioversion (N/A, 08/08/2017); Eye surgery; Reverse shoulder arthroplasty (Right, 03/29/2018); Roux-en-Y Gastric Bypass (1992); Gastroplasty vertical banded (1985); Laparoscopic lysis intestinal adhesions (1995); Total thyroidectomy (06/26/2012); Urethral dilation (2012); Shoulder arthroscopy with subacromial decompression (2016); Small intestine surgery (2000); Abdominal adhesion surgery (2004); Total knee arthroplasty (Left, 11/14/2018); Uterine fibroid embolization; Total knee arthroplasty (Right, 04/29/2019); Cardioversion (N/A, 06/18/2019); PACEMAKER IMPLANT (N/A, 06/20/2019); AV NODE ABLATION (N/A, 06/20/2019); Esophagogastroduodenoscopy (egd) with propofol (N/A, 07/25/2019); Balloon dilation (N/A, 07/25/2019); maloney dilation (07/25/2019); and Cystoscopy/ureteroscopy/holmium laser/stent placement (Right, 08/13/2019).  Family History:  Her family history includes Asthma in her father; Breast cancer in her maternal grandmother, mother, and paternal grandmother; Cancer in her maternal grandmother, mother,  and paternal grandmother; Diabetes in her father and mother; Epilepsy in her mother and sister; Heart disease in her father; Hypertension in her father and mother; Kidney disease in her father; Seizures in her mother.  Social History:  She  reports that she has never smoked. She has never used smokeless tobacco. She reports that she does not drink alcohol and does not use drugs.

## 2019-09-14 NOTE — Progress Notes (Signed)
Urinalysis    Component Value Date/Time   COLORURINE YELLOW 01/03/2019 2248   APPEARANCEUR HAZY (A) 01/03/2019 2248   LABSPEC 1.026 01/03/2019 2248   PHURINE 5.0 01/03/2019 2248   GLUCOSEU NEGATIVE 01/03/2019 2248   HGBUR NEGATIVE 01/03/2019 2248   BILIRUBINUR NEGATIVE 01/03/2019 2248   KETONESUR 5 (A) 01/03/2019 2248   PROTEINUR NEGATIVE 01/03/2019 2248   UROBILINOGEN 1.0 06/06/2013 2154   NITRITE NEGATIVE 01/03/2019 2248   LEUKOCYTESUR TRACE (A) 01/03/2019 2248    STUDIES: EEG  Result Date: 08/18/2019 Cameron Sprang, MD     08/22/2019 10:44 AM ELECTROENCEPHALOGRAM REPORT Date of Study: 08/18/2019 Patient's Name: Jillian Eaton Referring Provider: Dr. Ellouise Newer Clinical History: This is a 70 year old woman with new onset seizure 05/26/2018.  Strong family history of seizures. Medications: KEPPRA 500 MG tablet TYLENOL 650 MG CR tablet VENTOLIN HFA 108 (90 Base) MCG/ACT inhaler ELIQUIS 5 MG TABS tablet ROCALTROL 0.25 MCG capsule LOTRIMIN 1 % cream VITAMIN B-12, 1000 MCG/ML injection FLEXERIL 10 MG tablet CYMBALTA 60 MG capsule LUNESTA 2 MG TABS tablet ARNUITY ELLIPTA 100 MCG/ACT AEPB LASIX 20 MG tablet DILAUDID 2 MG tablet SYNTHROID 137 MCG tablet MAG-OX 400 (241.3 Mg) MG tablet Antifungal Cream - Terbinafine 3%, Fluconazole 2%, Tea Tree Oil 5%, Urea 10%, Ibuprofen 2% in DMSO  Suspension #10m. DITROPAN 5 MG tablet PYRIDIUM 200 MG tablet DESYREL 50 MG tablet DRISDOL 1.25 MG (50000 UT) CAPS Technical Summary: A multichannel digital 1-hour EEG recording measured by the international 10-20 system with electrodes applied with paste and impedances below 5000 ohms performed in our laboratory with EKG monitoring in an awake and asleep patient.  Hyperventilation was not performed. Photic stimulation was performed.  The digital EEG was referentially recorded, reformatted, and digitally filtered in a variety of bipolar and referential montages for optimal display.  Description: The patient is awake and asleep during the recording.  During maximal wakefulness, there is a symmetric, medium voltage 9.5 Hz posterior dominant rhythm that attenuates with eye opening.  The record is symmetric.  During drowsiness and sleep, there is an increase in theta slowing of the background, at times sharply contoured over the left temporal region without clear epileptogenic potential.  Vertex waves and symmetric sleep spindles were seen.  Hyperventilation and photic stimulation did not elicit any abnormalities.  There were no clear epileptiform discharges or electrographic seizures seen.  EKG lead was unremarkable. Impression: This 1-hour awake and asleep EEG is within normal limits. Clinical Correlation: A normal EEG does not exclude a clinical diagnosis of epilepsy.  If further clinical questions remain, prolonged EEG may be helpful.  Clinical correlation is advised. KEllouise Newer M.D.   AMBULATORY EEG  Result Date: 08/27/2019 ACameron Sprang MD     09/07/2019  4:38 PM ELECTROENCEPHALOGRAM REPORT Dates of Recording: 08/27/2019 7:57AM to 08/28/2019 8:10AM Patient's Name: Jillian SchorschMRN: [id number] of Birth: 4March 25, 1952Referring Provider: Dr. KEllouise NewerProcedure: 24-hour ambulatory video EEG History: This is a 69year old woman with convulsion in April 2020 in the setting of febrile illness. She  may have had another seizure in 2018. EEG to classify seizures. Medications: KEPPRA 500 MG tablet TYLENOL 650 MG CR tablet VENTOLIN HFA 108 (90 Base) MCG/ACT inhaler ELIQUIS 5 MG TABS tablet ROCALTROL 0.25 MCG capsule LOTRIMIN 1 % cream VITAMIN B-12, 1000 MCG/ML injection FLEXERIL 10 MG tablet CYMBALTA 60 MG capsule LUNESTA 2 MG TABS tablet ARNUITY ELLIPTA 100 MCG/ACT AEPB LASIX  Urinalysis    Component Value Date/Time   COLORURINE YELLOW 01/03/2019 2248   APPEARANCEUR HAZY (A) 01/03/2019 2248   LABSPEC 1.026 01/03/2019 2248   PHURINE 5.0 01/03/2019 2248   GLUCOSEU NEGATIVE 01/03/2019 2248   HGBUR NEGATIVE 01/03/2019 2248   BILIRUBINUR NEGATIVE 01/03/2019 2248   KETONESUR 5 (A) 01/03/2019 2248   PROTEINUR NEGATIVE 01/03/2019 2248   UROBILINOGEN 1.0 06/06/2013 2154   NITRITE NEGATIVE 01/03/2019 2248   LEUKOCYTESUR TRACE (A) 01/03/2019 2248    STUDIES: EEG  Result Date: 08/18/2019 Cameron Sprang, MD     08/22/2019 10:44 AM ELECTROENCEPHALOGRAM REPORT Date of Study: 08/18/2019 Patient's Name: Jillian Eaton Referring Provider: Dr. Ellouise Newer Clinical History: This is a 70 year old woman with new onset seizure 05/26/2018.  Strong family history of seizures. Medications: KEPPRA 500 MG tablet TYLENOL 650 MG CR tablet VENTOLIN HFA 108 (90 Base) MCG/ACT inhaler ELIQUIS 5 MG TABS tablet ROCALTROL 0.25 MCG capsule LOTRIMIN 1 % cream VITAMIN B-12, 1000 MCG/ML injection FLEXERIL 10 MG tablet CYMBALTA 60 MG capsule LUNESTA 2 MG TABS tablet ARNUITY ELLIPTA 100 MCG/ACT AEPB LASIX 20 MG tablet DILAUDID 2 MG tablet SYNTHROID 137 MCG tablet MAG-OX 400 (241.3 Mg) MG tablet Antifungal Cream - Terbinafine 3%, Fluconazole 2%, Tea Tree Oil 5%, Urea 10%, Ibuprofen 2% in DMSO  Suspension #10m. DITROPAN 5 MG tablet PYRIDIUM 200 MG tablet DESYREL 50 MG tablet DRISDOL 1.25 MG (50000 UT) CAPS Technical Summary: A multichannel digital 1-hour EEG recording measured by the international 10-20 system with electrodes applied with paste and impedances below 5000 ohms performed in our laboratory with EKG monitoring in an awake and asleep patient.  Hyperventilation was not performed. Photic stimulation was performed.  The digital EEG was referentially recorded, reformatted, and digitally filtered in a variety of bipolar and referential montages for optimal display.  Description: The patient is awake and asleep during the recording.  During maximal wakefulness, there is a symmetric, medium voltage 9.5 Hz posterior dominant rhythm that attenuates with eye opening.  The record is symmetric.  During drowsiness and sleep, there is an increase in theta slowing of the background, at times sharply contoured over the left temporal region without clear epileptogenic potential.  Vertex waves and symmetric sleep spindles were seen.  Hyperventilation and photic stimulation did not elicit any abnormalities.  There were no clear epileptiform discharges or electrographic seizures seen.  EKG lead was unremarkable. Impression: This 1-hour awake and asleep EEG is within normal limits. Clinical Correlation: A normal EEG does not exclude a clinical diagnosis of epilepsy.  If further clinical questions remain, prolonged EEG may be helpful.  Clinical correlation is advised. KEllouise Newer M.D.   AMBULATORY EEG  Result Date: 08/27/2019 ACameron Sprang MD     09/07/2019  4:38 PM ELECTROENCEPHALOGRAM REPORT Dates of Recording: 08/27/2019 7:57AM to 08/28/2019 8:10AM Patient's Name: Jillian SchorschMRN: [id number] of Birth: 4March 25, 1952Referring Provider: Dr. KEllouise NewerProcedure: 24-hour ambulatory video EEG History: This is a 69year old woman with convulsion in April 2020 in the setting of febrile illness. She  may have had another seizure in 2018. EEG to classify seizures. Medications: KEPPRA 500 MG tablet TYLENOL 650 MG CR tablet VENTOLIN HFA 108 (90 Base) MCG/ACT inhaler ELIQUIS 5 MG TABS tablet ROCALTROL 0.25 MCG capsule LOTRIMIN 1 % cream VITAMIN B-12, 1000 MCG/ML injection FLEXERIL 10 MG tablet CYMBALTA 60 MG capsule LUNESTA 2 MG TABS tablet ARNUITY ELLIPTA 100 MCG/ACT AEPB LASIX  [place]  Telephone:([phone] Fax:([phone]) [phone]     ID: Jillian Eaton DOB: [birth year]  MR#: [id number]  OAC#:[id number]  Patient Care Team: [name], MD as PCP - General (Internal Medicine) [name] Nadean Corwin, MD as PCP - Cardiology (Cardiology) Evans Lance, MD as PCP - Electrophysiology (Cardiology) Carol Ada, MD as Consulting Physician (Gastroenterology) Chesley Mires, MD as Consulting Physician (Pulmonary Disease) Philemon Kingdom, MD as Consulting Physician (Internal Medicine) Justin Mend, MD as Attending Physician (Internal Medicine) Marzetta Board, DPM as Consulting Physician (Podiatry) Servando Salina, MD as Consulting Physician (Obstetrics and Gynecology) Paralee Cancel, MD as Consulting Physician (Orthopedic Surgery) Cameron Sprang, MD as Consulting Physician (Neurology) Chauncey Cruel, MD OTHER MD:  CHIEF COMPLAINT: anemia  CURRENT TREATMENT: IV iron, b12 injections, oral folate   INTERVAL HISTORY: Jillian Eaton returns today for follow up of her anemia.  Since her last visit here she met with Dr. Delice Lesch was reevaluated and her Keppra medication for seizures was stopped 09/13/2019.  Recall that one of the possible causes for her anemia was that particular medication.   REVIEW OF SYSTEMS: Jillian Eaton is having significant calcium issues.  On the other hand she needs to boost her calcium level.  On the other she has had multiple kidney stones in the past.  She is caught between her endocrinologist and her urologist and is not quite sure how to solve that issues.  Otherwise she is pretty much at baseline, walks about a mile a day and some days she walks 2 miles.  She has had her maternal vaccine x2 and tolerated it well.  She has had no overt bleeding despite being on apixaban.  A detailed review of systems today was otherwise stable.   HISTORY OF CURRENT ILLNESS: From the original intake note:  Jahnay has a  longstanding history of anemia and iron deficiency.  She has been followed by hematology several years ago.  She has had two separate bariatric surgeries which have interfered with her absorption of nutrients, vitamins, and minerals.  This has helped contribute to her anemia.  Alliah is a patient of Dr. Maia Petties who has been ordering her iron infusions and b12 injections.    Her B12 level is as follows:  Results for Kosh, MATA ROWEN" (MRN [id number]) as of 05/15/2019 16:32  Ref. Range 07/21/2011 10:13 03/27/2012 11:17 07/15/2017 02:39 12/07/2018 15:15 05/15/2019 08:45  Vitamin B12 Latest Ref Range: 180 - 914 pg/mL 579 509 233 1,394 (H) 812    Her past iron studies and folate are is as follows: Results for Bugay, MALAAK STACH" (MRN [id number]) as of 05/15/2019 16:32  Ref. Range 07/15/2017 02:39 12/07/2018 15:15 12/07/2018 15:16 05/15/2019 08:45 05/15/2019 11:13  Iron Latest Ref Range: 41 - 142 ug/dL 28 31  33 (L)   UIBC Latest Ref Range: 120 - 384 ug/dL 405 298  280   TIBC Latest Ref Range: 236 - 444 ug/dL 433 329  313   Saturation Ratios Latest Ref Range: 21 - 57 % 6 (L) 9 (L)  10 (L)   Ferritin Latest Ref Range: 11 - 307 ng/mL 15 65  117   Folate Latest Ref Range: >5.9 ng/mL   11.9  49.2   Letishia has had a worsening anemia, even despite receiving IV iron on 02/27/2019, 03/19/2019, and 04/07/2019.  She notes that previously she didn't require so many iron infusions.  She wants to know what else could be causing her anemia, and what could also  [place]  Telephone:(336) [phone] Fax:(336) [phone]     ID: Jillian Eaton DOB: [birth year]  MR#: [id number]  OAC#:[id number]  Patient Care Team: [name], MD as PCP - General (Internal Medicine) [name] Nadean Corwin, MD as PCP - Cardiology (Cardiology) Evans Lance, MD as PCP - Electrophysiology (Cardiology) Carol Ada, MD as Consulting Physician (Gastroenterology) Chesley Mires, MD as Consulting Physician (Pulmonary Disease) Philemon Kingdom, MD as Consulting Physician (Internal Medicine) Justin Mend, MD as Attending Physician (Internal Medicine) Marzetta Board, DPM as Consulting Physician (Podiatry) Servando Salina, MD as Consulting Physician (Obstetrics and Gynecology) Paralee Cancel, MD as Consulting Physician (Orthopedic Surgery) Cameron Sprang, MD as Consulting Physician (Neurology) Chauncey Cruel, MD OTHER MD:  CHIEF COMPLAINT: anemia  CURRENT TREATMENT: IV iron, b12 injections, oral folate   INTERVAL HISTORY: Jillian Eaton returns today for follow up of her anemia.  Since her last visit here she met with Dr. Delice Lesch was reevaluated and her Keppra medication for seizures was stopped 09/13/2019.  Recall that one of the possible causes for her anemia was that particular medication.   REVIEW OF SYSTEMS: Jillian Eaton is having significant calcium issues.  On the other hand she needs to boost her calcium level.  On the other she has had multiple kidney stones in the past.  She is caught between her endocrinologist and her urologist and is not quite sure how to solve that issues.  Otherwise she is pretty much at baseline, walks about a mile a day and some days she walks 2 miles.  She has had her maternal vaccine x2 and tolerated it well.  She has had no overt bleeding despite being on apixaban.  A detailed review of systems today was otherwise stable.   HISTORY OF CURRENT ILLNESS: From the original intake note:  Jahnay has a  longstanding history of anemia and iron deficiency.  She has been followed by hematology several years ago.  She has had two separate bariatric surgeries which have interfered with her absorption of nutrients, vitamins, and minerals.  This has helped contribute to her anemia.  Alliah is a patient of Dr. Maia Petties who has been ordering her iron infusions and b12 injections.    Her B12 level is as follows:  Results for Kosh, MATA ROWEN" (MRN [id number]) as of 05/15/2019 16:32  Ref. Range 07/21/2011 10:13 03/27/2012 11:17 07/15/2017 02:39 12/07/2018 15:15 05/15/2019 08:45  Vitamin B12 Latest Ref Range: 180 - 914 pg/mL 579 509 233 1,394 (H) 812    Her past iron studies and folate are is as follows: Results for Bugay, MALAAK STACH" (MRN [id number]) as of 05/15/2019 16:32  Ref. Range 07/15/2017 02:39 12/07/2018 15:15 12/07/2018 15:16 05/15/2019 08:45 05/15/2019 11:13  Iron Latest Ref Range: 41 - 142 ug/dL 28 31  33 (L)   UIBC Latest Ref Range: 120 - 384 ug/dL 405 298  280   TIBC Latest Ref Range: 236 - 444 ug/dL 433 329  313   Saturation Ratios Latest Ref Range: 21 - 57 % 6 (L) 9 (L)  10 (L)   Ferritin Latest Ref Range: 11 - 307 ng/mL 15 65  117   Folate Latest Ref Range: >5.9 ng/mL   11.9  49.2   Letishia has had a worsening anemia, even despite receiving IV iron on 02/27/2019, 03/19/2019, and 04/07/2019.  She notes that previously she didn't require so many iron infusions.  She wants to know what else could be causing her anemia, and what could also  Urinalysis    Component Value Date/Time   COLORURINE YELLOW 01/03/2019 2248   APPEARANCEUR HAZY (A) 01/03/2019 2248   LABSPEC 1.026 01/03/2019 2248   PHURINE 5.0 01/03/2019 2248   GLUCOSEU NEGATIVE 01/03/2019 2248   HGBUR NEGATIVE 01/03/2019 2248   BILIRUBINUR NEGATIVE 01/03/2019 2248   KETONESUR 5 (A) 01/03/2019 2248   PROTEINUR NEGATIVE 01/03/2019 2248   UROBILINOGEN 1.0 06/06/2013 2154   NITRITE NEGATIVE 01/03/2019 2248   LEUKOCYTESUR TRACE (A) 01/03/2019 2248    STUDIES: EEG  Result Date: 08/18/2019 Cameron Sprang, MD     08/22/2019 10:44 AM ELECTROENCEPHALOGRAM REPORT Date of Study: 08/18/2019 Patient's Name: Jillian Eaton Referring Provider: Dr. Ellouise Newer Clinical History: This is a 70 year old woman with new onset seizure 05/26/2018.  Strong family history of seizures. Medications: KEPPRA 500 MG tablet TYLENOL 650 MG CR tablet VENTOLIN HFA 108 (90 Base) MCG/ACT inhaler ELIQUIS 5 MG TABS tablet ROCALTROL 0.25 MCG capsule LOTRIMIN 1 % cream VITAMIN B-12, 1000 MCG/ML injection FLEXERIL 10 MG tablet CYMBALTA 60 MG capsule LUNESTA 2 MG TABS tablet ARNUITY ELLIPTA 100 MCG/ACT AEPB LASIX 20 MG tablet DILAUDID 2 MG tablet SYNTHROID 137 MCG tablet MAG-OX 400 (241.3 Mg) MG tablet Antifungal Cream - Terbinafine 3%, Fluconazole 2%, Tea Tree Oil 5%, Urea 10%, Ibuprofen 2% in DMSO  Suspension #10m. DITROPAN 5 MG tablet PYRIDIUM 200 MG tablet DESYREL 50 MG tablet DRISDOL 1.25 MG (50000 UT) CAPS Technical Summary: A multichannel digital 1-hour EEG recording measured by the international 10-20 system with electrodes applied with paste and impedances below 5000 ohms performed in our laboratory with EKG monitoring in an awake and asleep patient.  Hyperventilation was not performed. Photic stimulation was performed.  The digital EEG was referentially recorded, reformatted, and digitally filtered in a variety of bipolar and referential montages for optimal display.  Description: The patient is awake and asleep during the recording.  During maximal wakefulness, there is a symmetric, medium voltage 9.5 Hz posterior dominant rhythm that attenuates with eye opening.  The record is symmetric.  During drowsiness and sleep, there is an increase in theta slowing of the background, at times sharply contoured over the left temporal region without clear epileptogenic potential.  Vertex waves and symmetric sleep spindles were seen.  Hyperventilation and photic stimulation did not elicit any abnormalities.  There were no clear epileptiform discharges or electrographic seizures seen.  EKG lead was unremarkable. Impression: This 1-hour awake and asleep EEG is within normal limits. Clinical Correlation: A normal EEG does not exclude a clinical diagnosis of epilepsy.  If further clinical questions remain, prolonged EEG may be helpful.  Clinical correlation is advised. KEllouise Newer M.D.   AMBULATORY EEG  Result Date: 08/27/2019 ACameron Sprang MD     09/07/2019  4:38 PM ELECTROENCEPHALOGRAM REPORT Dates of Recording: 08/27/2019 7:57AM to 08/28/2019 8:10AM Patient's Name: Jillian SchorschMRN: [id number] of Birth: 4March 25, 1952Referring Provider: Dr. KEllouise NewerProcedure: 24-hour ambulatory video EEG History: This is a 69year old woman with convulsion in April 2020 in the setting of febrile illness. She  may have had another seizure in 2018. EEG to classify seizures. Medications: KEPPRA 500 MG tablet TYLENOL 650 MG CR tablet VENTOLIN HFA 108 (90 Base) MCG/ACT inhaler ELIQUIS 5 MG TABS tablet ROCALTROL 0.25 MCG capsule LOTRIMIN 1 % cream VITAMIN B-12, 1000 MCG/ML injection FLEXERIL 10 MG tablet CYMBALTA 60 MG capsule LUNESTA 2 MG TABS tablet ARNUITY ELLIPTA 100 MCG/ACT AEPB LASIX  Urinalysis    Component Value Date/Time   COLORURINE YELLOW 01/03/2019 2248   APPEARANCEUR HAZY (A) 01/03/2019 2248   LABSPEC 1.026 01/03/2019 2248   PHURINE 5.0 01/03/2019 2248   GLUCOSEU NEGATIVE 01/03/2019 2248   HGBUR NEGATIVE 01/03/2019 2248   BILIRUBINUR NEGATIVE 01/03/2019 2248   KETONESUR 5 (A) 01/03/2019 2248   PROTEINUR NEGATIVE 01/03/2019 2248   UROBILINOGEN 1.0 06/06/2013 2154   NITRITE NEGATIVE 01/03/2019 2248   LEUKOCYTESUR TRACE (A) 01/03/2019 2248    STUDIES: EEG  Result Date: 08/18/2019 Cameron Sprang, MD     08/22/2019 10:44 AM ELECTROENCEPHALOGRAM REPORT Date of Study: 08/18/2019 Patient's Name: Jillian Eaton Referring Provider: Dr. Ellouise Newer Clinical History: This is a 70 year old woman with new onset seizure 05/26/2018.  Strong family history of seizures. Medications: KEPPRA 500 MG tablet TYLENOL 650 MG CR tablet VENTOLIN HFA 108 (90 Base) MCG/ACT inhaler ELIQUIS 5 MG TABS tablet ROCALTROL 0.25 MCG capsule LOTRIMIN 1 % cream VITAMIN B-12, 1000 MCG/ML injection FLEXERIL 10 MG tablet CYMBALTA 60 MG capsule LUNESTA 2 MG TABS tablet ARNUITY ELLIPTA 100 MCG/ACT AEPB LASIX 20 MG tablet DILAUDID 2 MG tablet SYNTHROID 137 MCG tablet MAG-OX 400 (241.3 Mg) MG tablet Antifungal Cream - Terbinafine 3%, Fluconazole 2%, Tea Tree Oil 5%, Urea 10%, Ibuprofen 2% in DMSO  Suspension #10m. DITROPAN 5 MG tablet PYRIDIUM 200 MG tablet DESYREL 50 MG tablet DRISDOL 1.25 MG (50000 UT) CAPS Technical Summary: A multichannel digital 1-hour EEG recording measured by the international 10-20 system with electrodes applied with paste and impedances below 5000 ohms performed in our laboratory with EKG monitoring in an awake and asleep patient.  Hyperventilation was not performed. Photic stimulation was performed.  The digital EEG was referentially recorded, reformatted, and digitally filtered in a variety of bipolar and referential montages for optimal display.  Description: The patient is awake and asleep during the recording.  During maximal wakefulness, there is a symmetric, medium voltage 9.5 Hz posterior dominant rhythm that attenuates with eye opening.  The record is symmetric.  During drowsiness and sleep, there is an increase in theta slowing of the background, at times sharply contoured over the left temporal region without clear epileptogenic potential.  Vertex waves and symmetric sleep spindles were seen.  Hyperventilation and photic stimulation did not elicit any abnormalities.  There were no clear epileptiform discharges or electrographic seizures seen.  EKG lead was unremarkable. Impression: This 1-hour awake and asleep EEG is within normal limits. Clinical Correlation: A normal EEG does not exclude a clinical diagnosis of epilepsy.  If further clinical questions remain, prolonged EEG may be helpful.  Clinical correlation is advised. KEllouise Newer M.D.   AMBULATORY EEG  Result Date: 08/27/2019 ACameron Sprang MD     09/07/2019  4:38 PM ELECTROENCEPHALOGRAM REPORT Dates of Recording: 08/27/2019 7:57AM to 08/28/2019 8:10AM Patient's Name: Jillian SchorschMRN: [id number] of Birth: 4March 25, 1952Referring Provider: Dr. KEllouise NewerProcedure: 24-hour ambulatory video EEG History: This is a 69year old woman with convulsion in April 2020 in the setting of febrile illness. She  may have had another seizure in 2018. EEG to classify seizures. Medications: KEPPRA 500 MG tablet TYLENOL 650 MG CR tablet VENTOLIN HFA 108 (90 Base) MCG/ACT inhaler ELIQUIS 5 MG TABS tablet ROCALTROL 0.25 MCG capsule LOTRIMIN 1 % cream VITAMIN B-12, 1000 MCG/ML injection FLEXERIL 10 MG tablet CYMBALTA 60 MG capsule LUNESTA 2 MG TABS tablet ARNUITY ELLIPTA 100 MCG/ACT AEPB LASIX  Urinalysis    Component Value Date/Time   COLORURINE YELLOW 01/03/2019 2248   APPEARANCEUR HAZY (A) 01/03/2019 2248   LABSPEC 1.026 01/03/2019 2248   PHURINE 5.0 01/03/2019 2248   GLUCOSEU NEGATIVE 01/03/2019 2248   HGBUR NEGATIVE 01/03/2019 2248   BILIRUBINUR NEGATIVE 01/03/2019 2248   KETONESUR 5 (A) 01/03/2019 2248   PROTEINUR NEGATIVE 01/03/2019 2248   UROBILINOGEN 1.0 06/06/2013 2154   NITRITE NEGATIVE 01/03/2019 2248   LEUKOCYTESUR TRACE (A) 01/03/2019 2248    STUDIES: EEG  Result Date: 08/18/2019 Cameron Sprang, MD     08/22/2019 10:44 AM ELECTROENCEPHALOGRAM REPORT Date of Study: 08/18/2019 Patient's Name: Jillian Eaton Referring Provider: Dr. Ellouise Newer Clinical History: This is a 70 year old woman with new onset seizure 05/26/2018.  Strong family history of seizures. Medications: KEPPRA 500 MG tablet TYLENOL 650 MG CR tablet VENTOLIN HFA 108 (90 Base) MCG/ACT inhaler ELIQUIS 5 MG TABS tablet ROCALTROL 0.25 MCG capsule LOTRIMIN 1 % cream VITAMIN B-12, 1000 MCG/ML injection FLEXERIL 10 MG tablet CYMBALTA 60 MG capsule LUNESTA 2 MG TABS tablet ARNUITY ELLIPTA 100 MCG/ACT AEPB LASIX 20 MG tablet DILAUDID 2 MG tablet SYNTHROID 137 MCG tablet MAG-OX 400 (241.3 Mg) MG tablet Antifungal Cream - Terbinafine 3%, Fluconazole 2%, Tea Tree Oil 5%, Urea 10%, Ibuprofen 2% in DMSO  Suspension #10m. DITROPAN 5 MG tablet PYRIDIUM 200 MG tablet DESYREL 50 MG tablet DRISDOL 1.25 MG (50000 UT) CAPS Technical Summary: A multichannel digital 1-hour EEG recording measured by the international 10-20 system with electrodes applied with paste and impedances below 5000 ohms performed in our laboratory with EKG monitoring in an awake and asleep patient.  Hyperventilation was not performed. Photic stimulation was performed.  The digital EEG was referentially recorded, reformatted, and digitally filtered in a variety of bipolar and referential montages for optimal display.  Description: The patient is awake and asleep during the recording.  During maximal wakefulness, there is a symmetric, medium voltage 9.5 Hz posterior dominant rhythm that attenuates with eye opening.  The record is symmetric.  During drowsiness and sleep, there is an increase in theta slowing of the background, at times sharply contoured over the left temporal region without clear epileptogenic potential.  Vertex waves and symmetric sleep spindles were seen.  Hyperventilation and photic stimulation did not elicit any abnormalities.  There were no clear epileptiform discharges or electrographic seizures seen.  EKG lead was unremarkable. Impression: This 1-hour awake and asleep EEG is within normal limits. Clinical Correlation: A normal EEG does not exclude a clinical diagnosis of epilepsy.  If further clinical questions remain, prolonged EEG may be helpful.  Clinical correlation is advised. KEllouise Newer M.D.   AMBULATORY EEG  Result Date: 08/27/2019 ACameron Sprang MD     09/07/2019  4:38 PM ELECTROENCEPHALOGRAM REPORT Dates of Recording: 08/27/2019 7:57AM to 08/28/2019 8:10AM Patient's Name: Jillian SchorschMRN: [id number] of Birth: 4March 25, 1952Referring Provider: Dr. KEllouise NewerProcedure: 24-hour ambulatory video EEG History: This is a 69year old woman with convulsion in April 2020 in the setting of febrile illness. She  may have had another seizure in 2018. EEG to classify seizures. Medications: KEPPRA 500 MG tablet TYLENOL 650 MG CR tablet VENTOLIN HFA 108 (90 Base) MCG/ACT inhaler ELIQUIS 5 MG TABS tablet ROCALTROL 0.25 MCG capsule LOTRIMIN 1 % cream VITAMIN B-12, 1000 MCG/ML injection FLEXERIL 10 MG tablet CYMBALTA 60 MG capsule LUNESTA 2 MG TABS tablet ARNUITY ELLIPTA 100 MCG/ACT AEPB LASIX  Urinalysis    Component Value Date/Time   COLORURINE YELLOW 01/03/2019 2248   APPEARANCEUR HAZY (A) 01/03/2019 2248   LABSPEC 1.026 01/03/2019 2248   PHURINE 5.0 01/03/2019 2248   GLUCOSEU NEGATIVE 01/03/2019 2248   HGBUR NEGATIVE 01/03/2019 2248   BILIRUBINUR NEGATIVE 01/03/2019 2248   KETONESUR 5 (A) 01/03/2019 2248   PROTEINUR NEGATIVE 01/03/2019 2248   UROBILINOGEN 1.0 06/06/2013 2154   NITRITE NEGATIVE 01/03/2019 2248   LEUKOCYTESUR TRACE (A) 01/03/2019 2248    STUDIES: EEG  Result Date: 08/18/2019 Cameron Sprang, MD     08/22/2019 10:44 AM ELECTROENCEPHALOGRAM REPORT Date of Study: 08/18/2019 Patient's Name: Jillian Eaton Referring Provider: Dr. Ellouise Newer Clinical History: This is a 70 year old woman with new onset seizure 05/26/2018.  Strong family history of seizures. Medications: KEPPRA 500 MG tablet TYLENOL 650 MG CR tablet VENTOLIN HFA 108 (90 Base) MCG/ACT inhaler ELIQUIS 5 MG TABS tablet ROCALTROL 0.25 MCG capsule LOTRIMIN 1 % cream VITAMIN B-12, 1000 MCG/ML injection FLEXERIL 10 MG tablet CYMBALTA 60 MG capsule LUNESTA 2 MG TABS tablet ARNUITY ELLIPTA 100 MCG/ACT AEPB LASIX 20 MG tablet DILAUDID 2 MG tablet SYNTHROID 137 MCG tablet MAG-OX 400 (241.3 Mg) MG tablet Antifungal Cream - Terbinafine 3%, Fluconazole 2%, Tea Tree Oil 5%, Urea 10%, Ibuprofen 2% in DMSO  Suspension #10m. DITROPAN 5 MG tablet PYRIDIUM 200 MG tablet DESYREL 50 MG tablet DRISDOL 1.25 MG (50000 UT) CAPS Technical Summary: A multichannel digital 1-hour EEG recording measured by the international 10-20 system with electrodes applied with paste and impedances below 5000 ohms performed in our laboratory with EKG monitoring in an awake and asleep patient.  Hyperventilation was not performed. Photic stimulation was performed.  The digital EEG was referentially recorded, reformatted, and digitally filtered in a variety of bipolar and referential montages for optimal display.  Description: The patient is awake and asleep during the recording.  During maximal wakefulness, there is a symmetric, medium voltage 9.5 Hz posterior dominant rhythm that attenuates with eye opening.  The record is symmetric.  During drowsiness and sleep, there is an increase in theta slowing of the background, at times sharply contoured over the left temporal region without clear epileptogenic potential.  Vertex waves and symmetric sleep spindles were seen.  Hyperventilation and photic stimulation did not elicit any abnormalities.  There were no clear epileptiform discharges or electrographic seizures seen.  EKG lead was unremarkable. Impression: This 1-hour awake and asleep EEG is within normal limits. Clinical Correlation: A normal EEG does not exclude a clinical diagnosis of epilepsy.  If further clinical questions remain, prolonged EEG may be helpful.  Clinical correlation is advised. KEllouise Newer M.D.   AMBULATORY EEG  Result Date: 08/27/2019 ACameron Sprang MD     09/07/2019  4:38 PM ELECTROENCEPHALOGRAM REPORT Dates of Recording: 08/27/2019 7:57AM to 08/28/2019 8:10AM Patient's Name: Jillian SchorschMRN: [id number] of Birth: 4March 25, 1952Referring Provider: Dr. KEllouise NewerProcedure: 24-hour ambulatory video EEG History: This is a 69year old woman with convulsion in April 2020 in the setting of febrile illness. She  may have had another seizure in 2018. EEG to classify seizures. Medications: KEPPRA 500 MG tablet TYLENOL 650 MG CR tablet VENTOLIN HFA 108 (90 Base) MCG/ACT inhaler ELIQUIS 5 MG TABS tablet ROCALTROL 0.25 MCG capsule LOTRIMIN 1 % cream VITAMIN B-12, 1000 MCG/ML injection FLEXERIL 10 MG tablet CYMBALTA 60 MG capsule LUNESTA 2 MG TABS tablet ARNUITY ELLIPTA 100 MCG/ACT AEPB LASIX

## 2019-09-15 ENCOUNTER — Other Ambulatory Visit: Payer: Self-pay

## 2019-09-15 ENCOUNTER — Inpatient Hospital Stay: Payer: Medicare Other | Attending: Oncology | Admitting: Oncology

## 2019-09-15 VITALS — BP 120/65 | HR 60 | Temp 97.8°F | Resp 16 | Ht 67.0 in | Wt 206.8 lb

## 2019-09-15 DIAGNOSIS — C73 Malignant neoplasm of thyroid gland: Secondary | ICD-10-CM

## 2019-09-15 DIAGNOSIS — Z950 Presence of cardiac pacemaker: Secondary | ICD-10-CM | POA: Diagnosis not present

## 2019-09-15 DIAGNOSIS — D510 Vitamin B12 deficiency anemia due to intrinsic factor deficiency: Secondary | ICD-10-CM | POA: Diagnosis not present

## 2019-09-15 DIAGNOSIS — D508 Other iron deficiency anemias: Secondary | ICD-10-CM

## 2019-09-15 DIAGNOSIS — I509 Heart failure, unspecified: Secondary | ICD-10-CM | POA: Diagnosis not present

## 2019-09-15 DIAGNOSIS — D509 Iron deficiency anemia, unspecified: Secondary | ICD-10-CM | POA: Insufficient documentation

## 2019-09-15 DIAGNOSIS — Z7901 Long term (current) use of anticoagulants: Secondary | ICD-10-CM | POA: Insufficient documentation

## 2019-09-15 DIAGNOSIS — I4891 Unspecified atrial fibrillation: Secondary | ICD-10-CM | POA: Insufficient documentation

## 2019-09-15 DIAGNOSIS — M797 Fibromyalgia: Secondary | ICD-10-CM | POA: Diagnosis not present

## 2019-09-15 DIAGNOSIS — E079 Disorder of thyroid, unspecified: Secondary | ICD-10-CM | POA: Diagnosis not present

## 2019-09-15 DIAGNOSIS — M1990 Unspecified osteoarthritis, unspecified site: Secondary | ICD-10-CM | POA: Insufficient documentation

## 2019-09-15 DIAGNOSIS — Z79899 Other long term (current) drug therapy: Secondary | ICD-10-CM | POA: Insufficient documentation

## 2019-09-15 DIAGNOSIS — M810 Age-related osteoporosis without current pathological fracture: Secondary | ICD-10-CM | POA: Insufficient documentation

## 2019-09-15 DIAGNOSIS — E538 Deficiency of other specified B group vitamins: Secondary | ICD-10-CM | POA: Insufficient documentation

## 2019-09-15 DIAGNOSIS — Z791 Long term (current) use of non-steroidal anti-inflammatories (NSAID): Secondary | ICD-10-CM | POA: Diagnosis not present

## 2019-09-15 DIAGNOSIS — Z87442 Personal history of urinary calculi: Secondary | ICD-10-CM | POA: Insufficient documentation

## 2019-09-16 ENCOUNTER — Telehealth: Payer: Self-pay | Admitting: Oncology

## 2019-09-16 DIAGNOSIS — E538 Deficiency of other specified B group vitamins: Secondary | ICD-10-CM | POA: Diagnosis not present

## 2019-09-16 DIAGNOSIS — I10 Essential (primary) hypertension: Secondary | ICD-10-CM | POA: Diagnosis not present

## 2019-09-16 DIAGNOSIS — D509 Iron deficiency anemia, unspecified: Secondary | ICD-10-CM | POA: Diagnosis not present

## 2019-09-16 NOTE — Telephone Encounter (Signed)
Scheduled appts per 7/26 los. Pt stated she would refer to mychart for appt info.

## 2019-09-18 ENCOUNTER — Inpatient Hospital Stay: Payer: Medicare Other

## 2019-09-18 ENCOUNTER — Other Ambulatory Visit: Payer: Self-pay

## 2019-09-18 VITALS — BP 138/72 | HR 60 | Temp 98.4°F | Resp 18

## 2019-09-18 DIAGNOSIS — E538 Deficiency of other specified B group vitamins: Secondary | ICD-10-CM | POA: Diagnosis not present

## 2019-09-18 DIAGNOSIS — I4891 Unspecified atrial fibrillation: Secondary | ICD-10-CM | POA: Diagnosis not present

## 2019-09-18 DIAGNOSIS — D508 Other iron deficiency anemias: Secondary | ICD-10-CM

## 2019-09-18 DIAGNOSIS — I509 Heart failure, unspecified: Secondary | ICD-10-CM | POA: Diagnosis not present

## 2019-09-18 DIAGNOSIS — Z87442 Personal history of urinary calculi: Secondary | ICD-10-CM | POA: Diagnosis not present

## 2019-09-18 DIAGNOSIS — Z950 Presence of cardiac pacemaker: Secondary | ICD-10-CM | POA: Diagnosis not present

## 2019-09-18 DIAGNOSIS — D509 Iron deficiency anemia, unspecified: Secondary | ICD-10-CM | POA: Diagnosis not present

## 2019-09-18 MED ORDER — SODIUM CHLORIDE 0.9 % IV SOLN
INTRAVENOUS | Status: DC
  Filled 2019-09-18: qty 250

## 2019-09-18 MED ORDER — SODIUM CHLORIDE 0.9 % IV SOLN
510.0000 mg | Freq: Once | INTRAVENOUS | Status: AC
  Administered 2019-09-18: 510 mg via INTRAVENOUS
  Filled 2019-09-18: qty 510

## 2019-09-18 NOTE — Patient Instructions (Signed)

## 2019-09-19 ENCOUNTER — Ambulatory Visit (INDEPENDENT_AMBULATORY_CARE_PROVIDER_SITE_OTHER): Payer: Medicare Other | Admitting: *Deleted

## 2019-09-19 DIAGNOSIS — I428 Other cardiomyopathies: Secondary | ICD-10-CM | POA: Diagnosis not present

## 2019-09-19 LAB — CUP PACEART REMOTE DEVICE CHECK
Battery Remaining Longevity: 119 mo
Battery Voltage: 3.18 V
Brady Statistic AP VP Percent: 0 %
Brady Statistic AP VS Percent: 0 %
Brady Statistic AS VP Percent: 99.98 %
Brady Statistic AS VS Percent: 0.02 %
Brady Statistic RA Percent Paced: 0 %
Brady Statistic RV Percent Paced: 99.98 %
Date Time Interrogation Session: 20210729192054
Implantable Lead Implant Date: 20210430
Implantable Lead Location: 753860
Implantable Lead Model: 3830
Implantable Pulse Generator Implant Date: 20210430
Lead Channel Impedance Value: 3382 Ohm
Lead Channel Impedance Value: 3382 Ohm
Lead Channel Impedance Value: 380 Ohm
Lead Channel Impedance Value: 551 Ohm
Lead Channel Pacing Threshold Amplitude: 0.625 V
Lead Channel Pacing Threshold Pulse Width: 0.4 ms
Lead Channel Sensing Intrinsic Amplitude: 6 mV
Lead Channel Setting Pacing Amplitude: 3.5 V
Lead Channel Setting Pacing Pulse Width: 0.4 ms
Lead Channel Setting Sensing Sensitivity: 4 mV

## 2019-09-23 NOTE — Progress Notes (Signed)
Remote pacemaker transmission.   

## 2019-09-24 ENCOUNTER — Ambulatory Visit (INDEPENDENT_AMBULATORY_CARE_PROVIDER_SITE_OTHER): Payer: Medicare Other | Admitting: Internal Medicine

## 2019-09-24 ENCOUNTER — Other Ambulatory Visit: Payer: Self-pay

## 2019-09-24 ENCOUNTER — Encounter: Payer: Self-pay | Admitting: Internal Medicine

## 2019-09-24 VITALS — BP 122/76 | HR 60 | Ht 67.0 in | Wt 201.0 lb

## 2019-09-24 DIAGNOSIS — I442 Atrioventricular block, complete: Secondary | ICD-10-CM | POA: Diagnosis not present

## 2019-09-24 DIAGNOSIS — I482 Chronic atrial fibrillation, unspecified: Secondary | ICD-10-CM | POA: Diagnosis not present

## 2019-09-24 DIAGNOSIS — Z950 Presence of cardiac pacemaker: Secondary | ICD-10-CM

## 2019-09-24 LAB — CUP PACEART INCLINIC DEVICE CHECK
Battery Remaining Longevity: 146 mo
Battery Voltage: 3.18 V
Brady Statistic AP VP Percent: 0 %
Brady Statistic AP VS Percent: 0 %
Brady Statistic AS VP Percent: 99.98 %
Brady Statistic AS VS Percent: 0.02 %
Brady Statistic RA Percent Paced: 0 %
Brady Statistic RV Percent Paced: 99.98 %
Date Time Interrogation Session: 20210804145115
Implantable Lead Implant Date: 20210430
Implantable Lead Location: 753860
Implantable Lead Model: 3830
Implantable Pulse Generator Implant Date: 20210430
Lead Channel Impedance Value: 3382 Ohm
Lead Channel Impedance Value: 3382 Ohm
Lead Channel Impedance Value: 399 Ohm
Lead Channel Impedance Value: 589 Ohm
Lead Channel Pacing Threshold Amplitude: 0.75 V
Lead Channel Pacing Threshold Pulse Width: 0.4 ms
Lead Channel Sensing Intrinsic Amplitude: 8.4 mV
Lead Channel Setting Pacing Amplitude: 2.5 V
Lead Channel Setting Pacing Pulse Width: 0.4 ms
Lead Channel Setting Sensing Sensitivity: 4 mV

## 2019-09-24 NOTE — Patient Instructions (Signed)

## 2019-09-25 ENCOUNTER — Inpatient Hospital Stay: Payer: Medicare Other | Attending: Oncology

## 2019-09-25 ENCOUNTER — Other Ambulatory Visit: Payer: Self-pay

## 2019-09-25 VITALS — BP 114/73 | HR 63 | Temp 98.2°F | Resp 18

## 2019-09-25 DIAGNOSIS — D508 Other iron deficiency anemias: Secondary | ICD-10-CM

## 2019-09-25 DIAGNOSIS — D500 Iron deficiency anemia secondary to blood loss (chronic): Secondary | ICD-10-CM

## 2019-09-25 DIAGNOSIS — D509 Iron deficiency anemia, unspecified: Secondary | ICD-10-CM | POA: Insufficient documentation

## 2019-09-25 MED ORDER — SODIUM CHLORIDE 0.9 % IV SOLN
INTRAVENOUS | Status: DC
  Filled 2019-09-25: qty 250

## 2019-09-25 MED ORDER — SODIUM CHLORIDE 0.9 % IV SOLN
510.0000 mg | Freq: Once | INTRAVENOUS | Status: AC
  Administered 2019-09-25: 510 mg via INTRAVENOUS
  Filled 2019-09-25: qty 17

## 2019-09-25 NOTE — Progress Notes (Signed)
HPI Ms. Kistler returns today for followup. She is a pleasant 69 yo woman with uncontrolled atrial fib who underwent AV node ablation and PPM insertion about 4 months ago. She had done well except that she notes some dizziness when she exerts herself. She remains on systemic anti-coagulation and has not had any bleeding.  Allergies  Allergen Reactions  . Hydrocodone Itching          Current Outpatient Medications  Medication Sig Dispense Refill  . albuterol (VENTOLIN HFA) 108 (90 Base) MCG/ACT inhaler Inhale 2 puffs into the lungs every 6 (six) hours as needed for wheezing or shortness of breath.    Marland Kitchen apixaban (ELIQUIS) 5 MG TABS tablet Take 1 tablet (5 mg total) by mouth 2 (two) times daily. Please resume eliquis on 5/5 pm, 30 tablet 0  . calcitRIOL (ROCALTROL) 0.25 MCG capsule Take 1 capsule (0.25 mcg total) by mouth daily. 90 capsule 3  . clotrimazole (LOTRIMIN) 1 % cream APPLY TO AFFECTED AREA TWICE A DAY (Patient taking differently: Apply 1 application topically 2 (two) times daily as needed (affected area(s) of feet). ) 45 g 0  . cyanocobalamin (,VITAMIN B-12,) 1000 MCG/ML injection Inject 1 mL (1,000 mcg total) into the muscle every 30 (thirty) days. 1 mL 0  . cyclobenzaprine (FLEXERIL) 10 MG tablet Take 20 mg by mouth at bedtime.     . DULoxetine (CYMBALTA) 60 MG capsule Take 1 capsule (60 mg total) by mouth daily. 30 capsule 0  . eszopiclone (LUNESTA) 2 MG TABS tablet Take 1 tablet (2 mg total) by mouth at bedtime. 20 tablet 0  . FOLIC ACID PO Take 1 tablet by mouth daily.    . furosemide (LASIX) 20 MG tablet Take 1 tablet (20 mg total) by mouth as needed. (Patient taking differently: Take 20 mg by mouth daily as needed (fluid retention). ) 90 tablet 2  . levothyroxine (SYNTHROID) 137 MCG tablet Take 1 tablet (137 mcg total) by mouth daily before breakfast. 45 tablet 3  . magnesium oxide (MAG-OX) 400 (241.3 Mg) MG tablet Take 400 mg by mouth daily.     . montelukast  (SINGULAIR) 10 MG tablet Take 1 tablet (10 mg total) by mouth at bedtime. 30 tablet 5  . NONFORMULARY OR COMPOUNDED ITEM [facility]:  Antifungal Cream - Terbinafine 3%, Fluconazole 2%, Tea Tree Oil 5%, Urea 10%, Ibuprofen 2% in DMSO Suspension #41ml. Apply to affected toenail(s) once daily (at bedtime) or twice daily. (Patient taking differently: Apply 1 application topically 2 (two) times daily as needed (affected area(s) of feet.). [facility]:  Antifungal Cream - Terbinafine 3%, Fluconazole 2%, Tea Tree Oil 5%, Urea 10%, Ibuprofen 2% in DMSO Suspension #1ml. Apply to affected toenail(s) once daily (at bedtime) or twice daily.) 30 each 11  . traZODone (DESYREL) 50 MG tablet Take 1 tablet (50 mg total) by mouth at bedtime. 30 tablet 0  . Vitamin D, Ergocalciferol, (DRISDOL) 1.25 MG (50000 UT) CAPS capsule Take 1 capsule (50,000 Units total) by mouth every Wednesday. 4 capsule 0   No current facility-administered medications for this visit.     Past Medical History:  Diagnosis Date  . Anemia    iron and pernicious  . Arthritis   . CHF (congestive heart failure) ([place]) 2018  . Complication of anesthesia    BP dropped   . DOE (dyspnea on exertion)    2D ECHO, 02/12/2012 - EF 60-65%, moderate concentric hypertrophy  . Dyspnea   . Dysrhythmia  a-fib  . Fibromyalgia    nerve pain"left side at waist level" "can't lay on that side without pain" , "HOB elevation helps"; pt. thinks this has resolved after 200lb weight loss9-17- since i lost the wieght , i dont  think i have this anymore   . Headache 04/2019  . Heart murmur   . Hematuria - cause not known    resolved   . History of kidney stones    x 2 '13, '14 surgery to remove  . Pneumonia    aspiration  04/2016  . Presence of permanent cardiac pacemaker   . SBO (small bowel obstruction) ([place])    rqueired admission 2020  . Thyroid disease    "goiter"  . Transfusion history    10 yrs+    ROS:   All systems reviewed  and negative except as noted in the HPI.   Past Surgical History:  Procedure Laterality Date  . ABDOMINAL ADHESION SURGERY  2004   open LOA - in [place]  . AV NODE ABLATION N/A 06/20/2019   Procedure: AV NODE ABLATION;  Surgeon: Evans Lance, MD;  Location: [facility];  Service: Cardiovascular;  Laterality: N/A;  . BALLOON DILATION N/A 07/25/2019   Procedure: BALLOON DILATION;  Surgeon: Carol Ada, MD;  Location: [facility];  Service: Endoscopy;  Laterality: N/A;  . CARDIAC CATHETERIZATION  04/04/2010   No significant obstructive coronary artery disease  . CARDIOVERSION N/A 08/08/2017   Procedure: CARDIOVERSION;  Surgeon: Pixie Casino, MD;  Location: [facility];  Service: Cardiovascular;  Laterality: N/A;  . CARDIOVERSION N/A 06/18/2019   Procedure: CARDIOVERSION;  Surgeon: Pixie Casino, MD;  Location: [facility];  Service: Cardiovascular;  Laterality: N/A;  . CHOLECYSTECTOMY  1990  . COLONOSCOPY W/ POLYPECTOMY    . COLONOSCOPY WITH PROPOFOL N/A 04/10/2014   Procedure: COLONOSCOPY WITH PROPOFOL;  Surgeon: Beryle Beams, MD;  Location: [facility];  Service: Endoscopy;  Laterality: N/A;  . CYSTOSCOPY/URETEROSCOPY/HOLMIUM LASER/STENT PLACEMENT Right 08/13/2019   Procedure: CYSTOSCOPY/RETROGRADE/URETEROSCOPY/HOLMIUM LASER/STENT PLACEMENT;  Surgeon: Ceasar Mons, MD;  Location: [facility];  Service: Urology;  Laterality: Right;  ONLY NEEDS 45 MIN  . ESOPHAGOGASTRODUODENOSCOPY (EGD) WITH PROPOFOL N/A 07/25/2019   Procedure: ESOPHAGOGASTRODUODENOSCOPY (EGD) WITH PROPOFOL;  Surgeon: Carol Ada, MD;  Location: [facility];  Service: Endoscopy;  Laterality: N/A;  . EYE SURGERY     lasik 20-25 yrs ago  . GASTROPLASTY VERTICAL BANDED  1985  . [place]   In [place] for SBO  . MALONEY DILATION  07/25/2019   Procedure: Venia Minks DILATION;  Surgeon: Carol Ada, MD;  Location: [facility];  Service: Endoscopy;;  . PACEMAKER IMPLANT  N/A 06/20/2019   Procedure: PACEMAKER IMPLANT;  Surgeon: Evans Lance, MD;  Location: [facility];  Service: Cardiovascular;  Laterality: N/A;  . REVERSE SHOULDER ARTHROPLASTY Right 03/29/2018   Procedure: REVERSE SHOULDER ARTHROPLASTY;  Surgeon: Netta Cedars, MD;  Location: [facility];  Service: Orthopedics;  Laterality: Right;  . ROUX-EN-Y GASTRIC BYPASS  1992   Conversion VBG to RnYGB in [place], [place]  . SHOULDER ARTHROSCOPY WITH SUBACROMIAL DECOMPRESSION  2016   Dr Berenice Primas, [place], [place]  . SMALL INTESTINE SURGERY  2000   SBO - LOA w SB resection  . TOTAL KNEE ARTHROPLASTY Left 11/14/2018   Procedure: TOTAL KNEE ARTHROPLASTY;  Surgeon: Paralee Cancel, MD;  Location: [facility];  Service: Orthopedics;  Laterality: Left;  70 mins  . TOTAL KNEE ARTHROPLASTY Right 04/29/2019  Procedure: TOTAL KNEE ARTHROPLASTY;  Surgeon: Paralee Cancel, MD;  Location: [facility];  Service: Orthopedics;  Laterality: Right;  70 mins  . TOTAL THYROIDECTOMY  06/26/2012   Dr Celine Ahr, [facility]  . TUBAL LIGATION  1986  . URETHRAL DILATION  2012   w cystoscopy.  Dr Gaynelle Arabian  . UTERINE FIBROID EMBOLIZATION       Family History  Problem Relation Age of Onset  . Diabetes Mother   . Epilepsy Mother   . Cancer Mother        Breast  . Hypertension Mother   . Breast cancer Mother   . Seizures Mother   . Kidney disease Father   . Diabetes Father   . Hypertension Father   . Asthma Father   . Heart disease Father   . Epilepsy Sister   . Cancer Maternal Grandmother   . Breast cancer Maternal Grandmother   . Cancer Paternal Grandmother   . Breast cancer Paternal Grandmother      Social History   Socioeconomic History  . Marital status: Married    Spouse name: Not on file  . Number of children: 3  . Years of education: Not on file  . Highest education level: Master's degree (e.g., MA, MS, MEng, MEd, MSW, MBA)  Occupational History  . Occupation: retired  Tobacco Use  . Smoking status: Never  Smoker  . Smokeless tobacco: Never Used  Vaping Use  . Vaping Use: Never used  Substance and Sexual Activity  . Alcohol use: No    Comment: wine occ  . Drug use: No    Types: Oxycodone    Comment: perscribed  . Sexual activity: Not Currently    Birth control/protection: None  Other Topics Concern  . Not on file  Social History Narrative   Right handed   1 cup of caffeine per day        Social Determinants of Health   Financial Resource Strain:   . Difficulty of Paying Living Expenses:   Food Insecurity:   . Worried About Charity fundraiser in the Last Year:   . Arboriculturist in the Last Year:   Transportation Needs:   . Film/video editor (Medical):   Marland Kitchen Lack of Transportation (Non-Medical):   Physical Activity:   . Days of Exercise per Week:   . Minutes of Exercise per Session:   Stress:   . Feeling of Stress :   Social Connections:   . Frequency of Communication with Friends and Family:   . Frequency of Social Gatherings with Friends and Family:   . Attends Religious Services:   . Active Member of Clubs or Organizations:   . Attends Archivist Meetings:   Marland Kitchen Marital Status:   Intimate Partner Violence:   . Fear of Current or Ex-Partner:   . Emotionally Abused:   Marland Kitchen Physically Abused:   . Sexually Abused:      BP 122/76   Pulse 60   Ht 5\' 7"  (1.702 m)   Wt 201 lb (91.2 kg)   SpO2 93%   BMI 31.48 kg/m   Physical Exam:  Well appearing NAD HEENT: Unremarkable Neck:  No JVD, no thyromegally Lymphatics:  No adenopathy Back:  No CVA tenderness Lungs:  Clear with no wheezes HEART:  Regular rate rhythm, no murmurs, no rubs, no clicks Abd:  soft, positive bowel sounds, no organomegally, no rebound, no guarding Ext:  2 plus pulses, no edema, no cyanosis, no clubbing Skin:  No rashes no nodules Neuro:  CN II through XII intact, motor grossly intact  DEVICE  Normal device function.  See PaceArt for details. Histograms  bluntred  Assess/Plan: 1. CHB - she is asymptomatic s/p PPM Insertion.  2. PPM - her medtronic single chamber PPM is working normally. We increased her rate response to improve the histograms 3. Fatigue and dyspnea with exertion - A part of this is due to inadequate rate response. Hopefully the reprogramming we did well help with this. 4. Atrial fib - her VR is well controlled.   Mikle Bosworth.D.

## 2019-09-25 NOTE — Patient Instructions (Signed)

## 2019-10-01 DIAGNOSIS — I129 Hypertensive chronic kidney disease with stage 1 through stage 4 chronic kidney disease, or unspecified chronic kidney disease: Secondary | ICD-10-CM | POA: Diagnosis not present

## 2019-10-01 DIAGNOSIS — I509 Heart failure, unspecified: Secondary | ICD-10-CM | POA: Diagnosis not present

## 2019-10-01 DIAGNOSIS — N182 Chronic kidney disease, stage 2 (mild): Secondary | ICD-10-CM | POA: Diagnosis not present

## 2019-10-01 DIAGNOSIS — D649 Anemia, unspecified: Secondary | ICD-10-CM | POA: Diagnosis not present

## 2019-10-01 DIAGNOSIS — N200 Calculus of kidney: Secondary | ICD-10-CM | POA: Diagnosis not present

## 2019-10-01 DIAGNOSIS — R319 Hematuria, unspecified: Secondary | ICD-10-CM | POA: Diagnosis not present

## 2019-10-03 ENCOUNTER — Encounter: Payer: Self-pay | Admitting: Oncology

## 2019-10-03 DIAGNOSIS — D519 Vitamin B12 deficiency anemia, unspecified: Secondary | ICD-10-CM | POA: Diagnosis not present

## 2019-10-03 DIAGNOSIS — H61109 Unspecified noninfective disorders of pinna, unspecified ear: Secondary | ICD-10-CM | POA: Diagnosis not present

## 2019-10-03 NOTE — Telephone Encounter (Signed)
MyChart message from patient. FYI  Dr Halford Chessman I want to let you know that since my August 4 appointment with Dr. Lovena Le who inserted my pacemaker and adjustments were made I have been feeling 100%. No breathing problems, no fatigue, I am able to do anything I want without having these type issues. I almost feel like it was a miracle that this is happened and I want to thank you as part of my medical staff for getting me to this point I really appreciate it. Note: Im taking the Singular tablet every night at bedtime.  Thank you Jillian Eaton

## 2019-10-06 ENCOUNTER — Encounter: Payer: Self-pay | Admitting: Internal Medicine

## 2019-10-06 DIAGNOSIS — S81811A Laceration without foreign body, right lower leg, initial encounter: Secondary | ICD-10-CM | POA: Diagnosis not present

## 2019-10-16 ENCOUNTER — Encounter: Payer: Self-pay | Admitting: Internal Medicine

## 2019-10-16 ENCOUNTER — Other Ambulatory Visit: Payer: Self-pay

## 2019-10-16 ENCOUNTER — Ambulatory Visit (INDEPENDENT_AMBULATORY_CARE_PROVIDER_SITE_OTHER): Payer: Medicare Other | Admitting: Internal Medicine

## 2019-10-16 VITALS — BP 110/76 | HR 81 | Ht 67.0 in | Wt 192.0 lb

## 2019-10-16 DIAGNOSIS — I272 Pulmonary hypertension, unspecified: Secondary | ICD-10-CM

## 2019-10-16 DIAGNOSIS — R0602 Shortness of breath: Secondary | ICD-10-CM

## 2019-10-16 DIAGNOSIS — I482 Chronic atrial fibrillation, unspecified: Secondary | ICD-10-CM

## 2019-10-16 DIAGNOSIS — Z950 Presence of cardiac pacemaker: Secondary | ICD-10-CM

## 2019-10-16 DIAGNOSIS — I442 Atrioventricular block, complete: Secondary | ICD-10-CM | POA: Diagnosis not present

## 2019-10-16 DIAGNOSIS — Z9889 Other specified postprocedural states: Secondary | ICD-10-CM

## 2019-10-16 DIAGNOSIS — Z96651 Presence of right artificial knee joint: Secondary | ICD-10-CM | POA: Diagnosis not present

## 2019-10-16 DIAGNOSIS — Z471 Aftercare following joint replacement surgery: Secondary | ICD-10-CM | POA: Diagnosis not present

## 2019-10-16 DIAGNOSIS — M546 Pain in thoracic spine: Secondary | ICD-10-CM | POA: Diagnosis not present

## 2019-10-16 NOTE — Patient Instructions (Signed)

## 2019-10-18 ENCOUNTER — Encounter: Payer: Self-pay | Admitting: Internal Medicine

## 2019-10-18 NOTE — Progress Notes (Signed)
OFFICE NOTE  Chief Complaint:  Follow-up dyspnea  Primary Care Physician: [name], MD  HPI:  Jillian Eaton is a 69 year old female I saw a few weeks ago with a history of super morbid obesity, fibromyalgia and increasing shortness of breath. She had a cath in 2012 which showed normal coronaries, mildly elevating filling pressures, and diastolic pressures with a mean PA of 31. This correlated with her echocardiogram when RV systolic pressures in 5397 were 49 on echo. A repeat echocardiogram was just performed which demonstrated a preserved LVEF of 60-65% with moderate concentric LVH. There was mild to moderate increase in pulmonary artery pressure with peak at 51, which has not significantly changed from her study 1 year ago. Although the pressures look very similar her shortness of breath has been increasing significantly, and I recommended that she wear oxygen at night since she had that at home which does seem to be helping her at least feel better during the day as I suspect she has sleep apnea. She has been tested before which was apparently negative and is considering retesting at some point in the future. Overall I think the main issue obviously is weight, and she unfortunately says that she is not a candidate for gastric bypass and therefore it is a very difficult situation.  I referred her back to her pulmonologist in [facility] for ongoing evaluation of pulmonary hypertension. She tells me that she was then referred to [facility] and saw a specialist there who did another right heart catheterization but did not recommend any medications other than better blood pressure control.   In addition she had problems with kidney stones and underwent 2 operations regarding tthis.  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  She is now dependent on thyroid medication. Unfortunately she's not been able to lose weight, but her weight is fairly  stable around 400 pounds as is her shortness of breath.  Cath in 2012:  LEFT HEART CATHETERIZATION  OPERATOR: Mali Sylas Twombly, MD, and Rolland Porter, MD  INDICATION: Dyspnea on exertion and chest pressure.  HISTORY OF PRESENT ILLNESS: Jillian Eaton is a morbidly obese (BMI greater than 63) female with a history of failed gastric bypass x2 with an increasing weight gain and increasing shortness of breath as well as new- onset dyspnea on exertion. She reports that she can only walk about 10 feet before she becomes short of breath and has chest pressure which she says get better with rest. She has numerous cardiac risk factors and given the high likelihood of false positive stress test, I have referred her for cardiac catheterization, both left and right heart as she has had elevated RVSP on echocardiogram of approximately 30 to 31 mmHg.  PROCEDURE: The patient was brought into the cardiac catheterization lab, sterilely prepped and draped in the usual fashion. The area around the right femoral artery and right brachial vein were cleansed and draped to allow an attempt at radial arterial and brachiocephalic venous access. IV was not able to be obtained prior to the procedure given her body habitus. With difficulty in assessing the vein, the ultrasound was eventually used to identify the right brachiocephalic vein, however, cannulation with needle and wire was not possible as the vein was very small in caliber. After mild local bleeding was controlled, we did turn our attention to the right femoral vein and with great difficulty using the ultrasound, the right femoral vein was accessed by Dr.  Ellyn Hack using a straight wire and a needle. After venous access was obtained, the attention was turned to the right radial artery by Dr. Ellyn Hack and simultaneous to that right heart catheterization was performed by myself without any difficulty. The right radial artery was  successfully cannulated and subsequently left coronary artery system was selectively injected with a 5-French TIG 4.0 catheter, however the right coronary artery could not be cannulated with the TIG catheter and was eventually cannulated with a JR-4 catheter. LV pressure was measured with a pigtail catheter. Estimated blood loss was about 30 mL. There were no acute complications. The patient received a total of 9 mg of Versed throughout the procedure as well as 225 mcg of fentanyl and was at no point greater than moderately sedated. She received 5000 units of heparin about 10 mL of a radial cocktail.  FINDINGS: 1. Left main - short, no disease. 2. LAD - no significant disease. 3. Left circumflex, no significant disease. 4. RCA - dominant, no disease, large-caliber vessel. 5. LVEDP = 20 mmHg. 6. RA - 12. 7. RV 38/12. 8. PA - 43/19 (31). 9. PCWP - 24. 10.TPG - 7. 11.Fick cardiac output/Fick cardiac index - 10.56/3.84. 12.Thermodilution cardiac output/thermodilution cardiac index -  6.78/2.47. 13.Aortic saturation - 94%. 14.PA saturation - 68%.  IMPRESSION: 1. No significant obstructive coronary artery disease. 2. LVEDP = 20 mmHg. 3. Borderline pulmonary venous hypertension. 4. High cardiac output.  [name] returns today for follow-up. She reports that her shortness of breath has not significantly worsened, in fact, possibly is slightly better. She did have thyroid surgery last year at [facility] and apparently underwent right heart catheterization prior to that. I do not have those records immediately available. Unfortunately she continues to maintain her weight and has not been able to lose any. She is complaining of some numbness and tingling in her feet which is likely neuropathy. She is on medication including Lyrica which she takes for fibromyalgia.  Jillian Eaton returns today for follow-up. She recently is been having more shortness of breath  and lower extremity swelling. She pointed out edema in her legs with very small blisters and some chronic venous stasis changes. She is not currently on a diuretic. She recently saw another new primary care provider with the [facility]. She was noted to be started on lisinopril 40 mg daily, which she is taking in addition to losartan 100 mg daily. The notes do not indicate from her office visit why she was started on lisinopril, but I can see through care everywhere that this was ordered by her primary care provider. She also takes amlodipine for blood pressure control. Her blood pressure was elevated initially at 178/94, but after resting came down to 120/78 and is at goal today. It is unusual, however to use both ACE inhibitors and ARBs.  07/02/2015  Jillian Eaton returns today for follow-up. She underwent a repeat echocardiogram for progressive dyspnea and exertion which showed a preserved LVEF of 6065% however there is moderate to severe pulmonary hypertension with an RVSP of 64 mmHg. This is increased about 10 mmHg compared to her prior study. Her mean pulmonary pressure by catheterization in 2012 was only 31 mmHg. I suspect that progressive pulmonary hypertension as a cause of her worsening shortness of breath. In fact, during recent hospitalization for surgery she required discharged with oxygen. She says she rarely uses oxygen at home but notes that she is hypoxic often when she checks her oxygen  levels. At her last office visit I recommended Lasix which she has been using sparingly. She has problems with incontinence and she does report an improvement in her swelling with it but does not notice significant change in her shortness of breath.  06/06/2016  Jillian Eaton returns from hospital follow-up. She was just admitted after an admission in January for unintentional narcotic overdose. She had unresponsiveness and possible aspiration. There was a second admission for a similar presentation  with respiratory failure, fever, sepsis and ultimately she was found to have a new onset cardiomyopathy with EF as low as 25%. Was felt that this was nonischemic. She was having intermittent atrial tachycardia and was felt that this might need to be managed with antiarrhythmic therapy however it seems to have resolved with carvedilol. She was placed on diuretics and reports that her breathing is close to baseline. She remains hypoxic with an O2 saturation 93% however was on home oxygen is result of her severe pulmonary hypertension. She does not feel that she needs to use the oxygen very regularly. From a heart failure standpoint she is on carvedilol, aspirin, furosemide and not currently on an ACE inhibitor, ARB or Entresto. Recent testing a renal function shows normal creatinine.  10/11/2016  Jillian Eaton is seen today in follow-up. In July she was hospitalized for hyperkalemia. She is on supplemental potassium and lisinopril, both were stopped. Fortunately her echo has improved back to normal recently. Overall she feels well. Her weight is down about 40 pounds of the recently she gained about 20 pounds back. She is working on that right as we speak.  04/03/2017  Jillian Eaton was seen today in follow-up.  She recently called in and had complaints of worsening shortness of breath.  She saw her pulmonary doctor who did an x-ray noted a mild right upper lobe infiltrate versus edema.  Labs were obtained including BNP which was only mildly elevated at 110.  She was given 20 mg of Lasix with no benefit and then recently was advised over the phone to increase her Lasix to 40 mg for 2 days.  She urinated quite a bit and lost about 5 pounds of what she feels was water weight.  She reports mild improvement in her breathing.  She is quite anxious about what might have led to this fluid gain.  Given her history of cardiomyopathy, it is possible she could have had some recurrent cardiomyopathy.  She reports stable diet and  no worsening abuse of sodium.  06/19/2017  Jillian Eaton returns today for follow-up.  She was supposed to have a limited echocardiogram but that never happened.  Approximately 2 to 3 weeks ago she called the office reporting palpitations.  We try to schedule an earlier appointment, but she ended up going to [facility] her [facility].  On the flight she apparently became short of breath had some left flank pain and was hypoxic.  She was given oxygen and after landing they took her to the [facility] in [place].  She was treated there and found to be in A. fib with RVR.  This is a new finding.  She had a remote history of either PAT or PAF, but so brief that she was not anticoagulated.  She was then started on Xarelto.  She tells me she was given Lasix and diuresed.  She returns today for follow-up and still reports some shortness of breath.  Weight is about 6 pounds heavier than she was 2 months ago.  She is in A. fib persistently with heart rates in the low 100s.  In addition she reports after starting the Xarelto that she has been having some vaginal bleeding.  07/26/2017  Jillian Eaton returns today for follow-up.  She was recently seen in the hospital and discharged on 07/17/2017.  Today is a transition of care follow-up.  She was contacted on the day after discharge and felt to be doing well.  She is currently in rehabilitation.  She says she has had marked improvement in her shortness of breath.  She feels like her edema has improved.  She has decreased her dose of daily Lasix from 80 mg to 40 mg.  She is in persistent atrial fibrillation however rate controlled on amiodarone 400 mg which she is taking twice daily.  She reports she is getting stronger and hopefully will be out of rehabilitation within the next week.  She did miss 1 dose of Eliquis due to a transfer issue on May 28 and therefore will need 3 weeks of subsequent uninterrupted anticoagulation prior to attempted  cardioversion.  09/13/2017  Jillian Eaton returns today for follow-up of cardioversion.  Unfortunately this was unsuccessful after 3 shocks.  She told me there is some confusion afterwards that she forgot to take her amiodarone.  She was taking it up until the cardioversion.  Since it was unsuccessful, I recommended rate control strategy.  At this point there is little benefit from being on amiodarone and the side effect profile would not make it favorable.  I would recommend just continuing carvedilol for rate control.  She is had significant weight loss now down to 313 from 340.  This will continue to help her symptoms.  Her shortness of breath has improved.  She has had no significant worsening edema.  No further significant GU bleeding.  09/28/2017  Jillian Eaton returns today for follow-up of multiple ER visits.  I last saw her a couple weeks ago and unfortunately she was maintaining A. fib after unsuccessful cardioversion.  She continues to lose weight.  In fact she is down to 306 pounds today from 313.  Lab work indicates a rising creatinine and very low BNP, which makes me feel that she may be over diuresed.  She is also noted to be anemic with iron deficiency and is anticoagulated on Eliquis.  Her PCP is likely to start her on iron.  She denies any blood in the stool however stool guaiacs or work-up for microscopic iron is indicated.  She reports intermittent hematuria which is been minor.  Blood pressure is noted to be low today 94/69.  She is on low-dose carvedilol for rate control as well as hydrochlorothiazide and low-dose Lasix.  Recently she said she has had improvement in her breathing and for the first time the other day was able to sleep without oxygen.  10/12/2017  Jillian Eaton was seen again today in follow-up.  She called the office that she noted to have significant weight gain.  In July she was 313 and had lost weight down to 297.  Subsequently was found out that she was possibly overtreated  with her thyroid medication and her dose was decreased from 300 mcg to 200 mcg daily by her endocrinologist.  Since then she has gained weight but she has been up 12 pounds over the past 2 weeks.  She does report some edema.  I had stopped her hydrochlorothiazide due to hypotension and blood pressure is better today 112/70.  She takes low-dose  Lasix 20 mg daily.  She also reported that she now has stage III chronic kidney disease.  12/13/2017  Jillian Eaton returns today for follow-up.  She is done well and felt very stable.  Although her weight is up about 10 pounds she denies any worsening fluid.  She remains on 20 mg Lasix daily.  She is been working with her endocrinologist who reduced her levothyroxine.  Some of the weight gain may be related to that as she was on a very high dose of 300 mcg daily.  She denies any worsening shortness of breath.  She is having problems with her right shoulder and is scheduled to go arthroplasty by Dr. Alma Friendly.  She is here today for preoperative risk assessment.  She denies any chest pain.  She is on Eliquis for A. fib which is persistent but rate control.  She would have to stop this typically about 3 days prior to the procedure.  01/15/2018  Jillian Eaton is seen today for follow-up of recent surgery.  Actually she did not have surgery rather was induced by anesthesia and then had significant hypotension and instability requiring IV fluids and pressors.  Based on that response anesthesia recommended that the surgery be canceled.  I spoke with Dr. Alma Friendly subsequent to that about the episode and agreed to see her back to help determine why she may have had such a response.  She actually had significant improvement in LV function by echo 6 months ago back to normal.  She has not had recurrent A. fib and has been euvolemic.  She is on a diuretic.  Weight has been going back up however this is been related to changing her thyroid medications as she was iatrogenically hyperthyroid.   Stress testing was not pursued because she has a history of normal coronaries by cath in 2012 and since her EF improved recently with medical therapy alone, this would have been unlikely if she had significant coronary disease.  I suspect that her hypotension was due to being volume depleted and that she had a marked response to the propofol leading to her hypotension.  06/04/2018  Jillian Eaton was seen today for hospital follow-up as a transition of care 7 visit.  Unfortunately she was admitted with confusion and seizure.  She was started on Keppra.  She did recover from this but was noted to be in A. fib with RVR.  Her carvedilol was switched to Toprol-XL 100 mg daily.  As she had hypotension, her diuretic was discontinued.  Today she reports her weight is even lower.  She denies any new swelling.  She has had some confusion and feels hot and chilled at times.  We had difficulty getting her blood pressure today however I believe it is between 90 and 588 systolic.  This is consistent with her discharge blood pressure readings.  She is concerned she may be on too much metoprolol.  09/09/2018  Jillian Eaton is seen today in follow-up.  Overall she is doing well.  In fact she is lost more weight.  She is now 249 pounds.  She is trying to lose enough weight where she might be a candidate for surgery.  She is not had any further seizure activity.  She does have follow-up with her neurologist tomorrow who may adjust her antiepileptic medications.  I had decreased her beta-blocker because of hypotension.  Blood pressure was actually not able to be obtained today.  She says at home generally runs around 502 systolic.  She  is not had any presyncopal episodes related to this.  Overall she feels well.  She is actually expressed some interest in exertion and perhaps pulmonary rehabilitation for diastolic heart failure.  She generally has NYHA class II-III symptoms.  12/05/2018  Jillian Eaton is seen back today in follow-up.  In  the interim she is [name], NP, is working with her for shortness of breath.  Some additional testing was performed that showed some anemia.  Was also thought may be there was some congestive heart failure.  She was then seen in the ER with concerns about possible DVT/PE.  Work-up there was negative and the chest x-ray suggested the possibility of a pericardial effusion and further testing was recommended.  BNP was close to 300 but not significantly elevated.  Subsequent to that she had started taking Lasix now on a daily basis.  Her last echo showed a low normal LVEF of 50 to 55% with trivial pericardial fluid back in April 2020.  She remains in persistent atrial fibrillation which is rate controlled.  01/24/2019  Jillian Eaton is seen today for follow-up of her dyspnea.  She continues to have shortness of breath.  She is scheduled to have another knee replacement surgery coming up.  She is also ordered for pulmonary function testing and does have a pulmonary follow-up in January.  I suspect there may be a pulmonary issue.  Her weight is gone up about 10 pounds as well however she feels that this may be fluid related.  She is also noted increased lower extremity swelling.  She has been using Lasix 40 mg as needed but not regularly.  I think she may need to be on daily diuretic.  04/02/2019  Ms. Lasch returns today for follow-up.  She reports her dyspnea has improved.  She was thought to have moderate persistent asthma and her symptoms have improved with adjustment in her inhalers.  She also was found to be mildly volume overloaded on increased her diuretics.  Finally she was iatrogenically hyperthyroid.  This was corrected as well.  She also had some mild anemia which was improved with iron.  Overall the combination of these factors made her short of breath and somewhat fatigued.  She ultimately did not undergo her knee surgery but plans to reschedule in the near future.  From a cardiac standpoint she is  at acceptable risk for this.  07/11/2019  Ms. Brines returns today for follow-up.  She continues to have some persistent dyspnea.  She was recently hospitalized and with difficult to control atrial fibrillation and ultimately underwent AV node ablation and permanent pacemaker placement.  Since then she has had better heart rate control with some mild improvement in her dyspnea but reports is still not to the level that she expects.  She has been seen by pulmonary as well I do not feel like there is ongoing reason for her dyspnea other than possibly pulmonary hypertension.  A previous echo had shown mean pulmonary pressure around 50.  She could have a component of exercise-induced pulmonary hypertension that is more severe.  Her weight has finally stabilized around 200 pounds.  10/16/2019  Ms. Greenwell is seen today in follow-up.  She is doing quite well at this point.  She had recently seen Dr. Lovena Le who readjusted her pacemaker.  She has had some other medication changes and seems to have had improvement in her dyspnea.  She was found to be anemic and underwent iron therapy.  That has improved her  dyspnea somewhat.  Weight seems to have stabilized somewhere around 192 pounds although lower than it had been previously.  For really the first time that I can remember to while she says she feels well.  She denies any bleeding issues on Eliquis although there is a concern for chronic blood loss anemia.  PMHx:  Past Medical History:  Diagnosis Date  . Anemia    iron and pernicious  . Arthritis   . CHF (congestive heart failure) ([place]) 2018  . Complication of anesthesia    BP dropped   . DOE (dyspnea on exertion)    2D ECHO, 02/12/2012 - EF 60-65%, moderate concentric hypertrophy  . Dyspnea   . Dysrhythmia    a-fib  . Fibromyalgia    nerve pain"left side at waist level" "can't lay on that side without pain" , "HOB elevation helps"; pt. thinks this has resolved after 200lb weight loss9-17- since i lost  the wieght , i dont  think i have this anymore   . Headache 04/2019  . Heart murmur   . Hematuria - cause not known    resolved   . History of kidney stones    x 2 '13, '14 surgery to remove  . Pneumonia    aspiration  04/2016  . Presence of permanent cardiac pacemaker   . SBO (small bowel obstruction) ([place])    rqueired admission 2020  . Thyroid disease    "goiter"  . Transfusion history    10 yrs+    Past Surgical History:  Procedure Laterality Date  . ABDOMINAL ADHESION SURGERY  2004   open LOA - in [place]  . AV NODE ABLATION N/A 06/20/2019   Procedure: AV NODE ABLATION;  Surgeon: Evans Lance, MD;  Location: [facility];  Service: Cardiovascular;  Laterality: N/A;  . BALLOON DILATION N/A 07/25/2019   Procedure: BALLOON DILATION;  Surgeon: Carol Ada, MD;  Location: [facility];  Service: Endoscopy;  Laterality: N/A;  . CARDIAC CATHETERIZATION  04/04/2010   No significant obstructive coronary artery disease  . CARDIOVERSION N/A 08/08/2017   Procedure: CARDIOVERSION;  Surgeon: Pixie Casino, MD;  Location: [facility];  Service: Cardiovascular;  Laterality: N/A;  . CARDIOVERSION N/A 06/18/2019   Procedure: CARDIOVERSION;  Surgeon: Pixie Casino, MD;  Location: [facility];  Service: Cardiovascular;  Laterality: N/A;  . CHOLECYSTECTOMY  1990  . COLONOSCOPY W/ POLYPECTOMY    . COLONOSCOPY WITH PROPOFOL N/A 04/10/2014   Procedure: COLONOSCOPY WITH PROPOFOL;  Surgeon: Beryle Beams, MD;  Location: [facility];  Service: Endoscopy;  Laterality: N/A;  . CYSTOSCOPY/URETEROSCOPY/HOLMIUM LASER/STENT PLACEMENT Right 08/13/2019   Procedure: CYSTOSCOPY/RETROGRADE/URETEROSCOPY/HOLMIUM LASER/STENT PLACEMENT;  Surgeon: Ceasar Mons, MD;  Location: [facility];  Service: Urology;  Laterality: Right;  ONLY NEEDS 45 MIN  . ESOPHAGOGASTRODUODENOSCOPY (EGD) WITH PROPOFOL N/A 07/25/2019   Procedure: ESOPHAGOGASTRODUODENOSCOPY (EGD) WITH PROPOFOL;  Surgeon: Carol Ada, MD;   Location: [facility];  Service: Endoscopy;  Laterality: N/A;  . EYE SURGERY     lasik 20-25 yrs ago  . GASTROPLASTY VERTICAL BANDED  1985  . [place]   In [place] for SBO  . MALONEY DILATION  07/25/2019   Procedure: Venia Minks DILATION;  Surgeon: Carol Ada, MD;  Location: [facility];  Service: Endoscopy;;  . PACEMAKER IMPLANT N/A 06/20/2019   Procedure: PACEMAKER IMPLANT;  Surgeon: Evans Lance, MD;  Location: [facility];  Service: Cardiovascular;  Laterality: N/A;  . REVERSE SHOULDER ARTHROPLASTY Right 03/29/2018  Procedure: REVERSE SHOULDER ARTHROPLASTY;  Surgeon: Netta Cedars, MD;  Location: [facility];  Service: Orthopedics;  Laterality: Right;  . ROUX-EN-Y GASTRIC BYPASS  1992   Conversion [facility] to RnYGB in [place], [place]  . SHOULDER ARTHROSCOPY WITH SUBACROMIAL DECOMPRESSION  2016   Dr Berenice Primas, [place], [place]  . SMALL INTESTINE SURGERY  2000   [facility] - LOA w SB resection  . TOTAL KNEE ARTHROPLASTY Left 11/14/2018   Procedure: TOTAL KNEE ARTHROPLASTY;  Surgeon: Paralee Cancel, MD;  Location: [facility];  Service: Orthopedics;  Laterality: Left;  70 mins  . TOTAL KNEE ARTHROPLASTY Right 04/29/2019   Procedure: TOTAL KNEE ARTHROPLASTY;  Surgeon: Paralee Cancel, MD;  Location: [facility];  Service: Orthopedics;  Laterality: Right;  70 mins  . TOTAL THYROIDECTOMY  06/26/2012   Dr Celine Ahr, [facility]  . TUBAL LIGATION  1986  . URETHRAL DILATION  2012   w cystoscopy.  Dr Gaynelle Arabian  . UTERINE FIBROID EMBOLIZATION      FAMHx:  Family History  Problem Relation Age of Onset  . Diabetes Mother   . Epilepsy Mother   . Cancer Mother        Breast  . Hypertension Mother   . Breast cancer Mother   . Seizures Mother   . Kidney disease Father   . Diabetes Father   . Hypertension Father   . Asthma Father   . Heart disease Father   . Epilepsy Sister   . Cancer Maternal Grandmother   . Breast cancer Maternal Grandmother   . Cancer Paternal  Grandmother   . Breast cancer Paternal Grandmother     SOCHx:   reports that she has never smoked. She has never used smokeless tobacco. She reports that she does not drink alcohol and does not use drugs.  ALLERGIES:  Allergies  Allergen Reactions  . Hydrocodone Itching         ROS: Pertinent items noted in HPI and remainder of comprehensive ROS otherwise negative.  HOME MEDS: Current Outpatient Medications  Medication Sig Dispense Refill  . albuterol (VENTOLIN HFA) 108 (90 Base) MCG/ACT inhaler Inhale 2 puffs into the lungs every 6 (six) hours as needed for wheezing or shortness of breath.    Marland Kitchen apixaban (ELIQUIS) 5 MG TABS tablet Take 1 tablet (5 mg total) by mouth 2 (two) times daily. Please resume eliquis on 5/5 pm, 30 tablet 0  . calcitRIOL (ROCALTROL) 0.25 MCG capsule Take 1 capsule (0.25 mcg total) by mouth daily. 90 capsule 3  . clotrimazole (LOTRIMIN) 1 % cream APPLY TO AFFECTED AREA TWICE A DAY (Patient taking differently: Apply 1 application topically 2 (two) times daily as needed (affected area(s) of feet). ) 45 g 0  . cyanocobalamin (,VITAMIN B-12,) 1000 MCG/ML injection Inject 1 mL (1,000 mcg total) into the muscle every 30 (thirty) days. 1 mL 0  . cyclobenzaprine (FLEXERIL) 10 MG tablet Take 20 mg by mouth at bedtime.     . DULoxetine (CYMBALTA) 60 MG capsule Take 1 capsule (60 mg total) by mouth daily. 30 capsule 0  . eszopiclone (LUNESTA) 2 MG TABS tablet Take 1 tablet (2 mg total) by mouth at bedtime. 20 tablet 0  . FOLIC ACID PO Take 1 tablet by mouth daily.    . furosemide (LASIX) 20 MG tablet Take 1 tablet (20 mg total) by mouth as needed. (Patient taking differently: Take 20 mg by mouth daily as needed (fluid retention). ) 90 tablet 2  . levothyroxine (SYNTHROID) 137 MCG tablet Take 1  tablet (137 mcg total) by mouth daily before breakfast. 45 tablet 3  . magnesium oxide (MAG-OX) 400 (241.3 Mg) MG tablet Take 400 mg by mouth daily.     . montelukast (SINGULAIR) 10  MG tablet Take 1 tablet (10 mg total) by mouth at bedtime. 30 tablet 5  . NONFORMULARY OR COMPOUNDED ITEM [facility]:  Antifungal Cream - Terbinafine 3%, Fluconazole 2%, Tea Tree Oil 5%, Urea 10%, Ibuprofen 2% in DMSO Suspension #77ml. Apply to affected toenail(s) once daily (at bedtime) or twice daily. (Patient taking differently: Apply 1 application topically 2 (two) times daily as needed (affected area(s) of feet.). [facility]:  Antifungal Cream - Terbinafine 3%, Fluconazole 2%, Tea Tree Oil 5%, Urea 10%, Ibuprofen 2% in DMSO Suspension #37ml. Apply to affected toenail(s) once daily (at bedtime) or twice daily.) 30 each 11  . traZODone (DESYREL) 50 MG tablet Take 1 tablet (50 mg total) by mouth at bedtime. 30 tablet 0  . Vitamin D, Ergocalciferol, (DRISDOL) 1.25 MG (50000 UT) CAPS capsule Take 1 capsule (50,000 Units total) by mouth every Wednesday. 4 capsule 0   No current facility-administered medications for this visit.    LABS/IMAGING: No results found for this or any previous visit (from the past 48 hour(s)). No results found.  VITALS: BP 110/76   Pulse 81   Ht 5\' 7"  (1.702 m)   Wt 192 lb (87.1 kg)   BMI 30.07 kg/m   EXAM: General appearance: alert and mildly obese Neck: no carotid bruit and no JVD Lungs: diminished breath sounds bilaterally Heart: regular rate and rhythm Abdomen: soft, non-tender; bowel sounds normal; no masses,  no organomegaly Extremities: extremities normal, atraumatic, no cyanosis or edema Pulses: 2+ and symmetric Skin: Skin color, texture, turgor normal. No rashes or lesions Neurologic: Grossly normal Psych: Pleasant  EKG: Ventricular paced rhythm at 81-personally reviewed  ASSESSMENT: 1. DOE- improved 2. Anesthesia-associated hypotension 3. A. fib with RVR-CHADSVASC score of 4 - s/p AVN ablation and PPM placement ([employer]) - pacer dependent 4. Chronic diastolic congestive heart failure-LVEF 25% (improved to 60-65% in  06/2017) 5. Super morbid obesity, with failure of 3 gastric bypass procedures -recent significant weight loss 6. Pulmonary hypertension - PA pressure of 64 mmHg, normal LV systolic function 7. Progressive DOE 8. Hypertension-controlled  9. Possible A. fib/ectopic atrial tachycardia-resolved 10. Seizures  PLAN: 1.   Ms. Wisby seems to be finally feeling better after multiple issues and recurrent issues with dyspnea after pacemaker placement and adjustment, improvement in anemia with ongoing anticoagulation and her weight seems to finally have stabilized.  I am pleased that she is feeling better I think we could see her back probably in 6 months rather than 3 months this time.  Pixie Casino, MD, [facility], [place] Director of the [facility] Diplomate of the American Board of Clinical Lipidology Attending Cardiologist  Direct Dial: [phone]  Fax: [phone]  Website:  [email or web address] 10/18/2019, 5:46 PM

## 2019-10-20 DIAGNOSIS — N201 Calculus of ureter: Secondary | ICD-10-CM | POA: Diagnosis not present

## 2019-10-22 DIAGNOSIS — Z4802 Encounter for removal of sutures: Secondary | ICD-10-CM | POA: Diagnosis not present

## 2019-10-28 DIAGNOSIS — D509 Iron deficiency anemia, unspecified: Secondary | ICD-10-CM | POA: Diagnosis not present

## 2019-10-28 DIAGNOSIS — Z4802 Encounter for removal of sutures: Secondary | ICD-10-CM | POA: Diagnosis not present

## 2019-10-28 DIAGNOSIS — D519 Vitamin B12 deficiency anemia, unspecified: Secondary | ICD-10-CM | POA: Diagnosis not present

## 2019-10-28 DIAGNOSIS — I10 Essential (primary) hypertension: Secondary | ICD-10-CM | POA: Diagnosis not present

## 2019-11-03 DIAGNOSIS — Z20822 Contact with and (suspected) exposure to covid-19: Secondary | ICD-10-CM | POA: Diagnosis not present

## 2019-11-12 DIAGNOSIS — M25561 Pain in right knee: Secondary | ICD-10-CM | POA: Diagnosis not present

## 2019-11-14 DIAGNOSIS — E890 Postprocedural hypothyroidism: Secondary | ICD-10-CM | POA: Diagnosis not present

## 2019-11-14 DIAGNOSIS — D509 Iron deficiency anemia, unspecified: Secondary | ICD-10-CM | POA: Diagnosis not present

## 2019-11-14 DIAGNOSIS — M545 Low back pain: Secondary | ICD-10-CM | POA: Diagnosis not present

## 2019-11-14 DIAGNOSIS — Z20822 Contact with and (suspected) exposure to covid-19: Secondary | ICD-10-CM | POA: Diagnosis not present

## 2019-11-16 ENCOUNTER — Other Ambulatory Visit: Payer: Self-pay | Admitting: Internal Medicine

## 2019-11-19 DIAGNOSIS — M25561 Pain in right knee: Secondary | ICD-10-CM | POA: Diagnosis not present

## 2019-11-24 ENCOUNTER — Ambulatory Visit: Payer: Medicare Other | Admitting: Podiatry

## 2019-11-24 DIAGNOSIS — Z23 Encounter for immunization: Secondary | ICD-10-CM | POA: Diagnosis not present

## 2019-11-24 DIAGNOSIS — M47817 Spondylosis without myelopathy or radiculopathy, lumbosacral region: Secondary | ICD-10-CM | POA: Diagnosis not present

## 2019-11-24 DIAGNOSIS — M545 Low back pain, unspecified: Secondary | ICD-10-CM | POA: Diagnosis not present

## 2019-11-26 DIAGNOSIS — M25561 Pain in right knee: Secondary | ICD-10-CM | POA: Diagnosis not present

## 2019-11-28 ENCOUNTER — Ambulatory Visit: Payer: Medicare Other | Admitting: Podiatry

## 2019-11-28 ENCOUNTER — Other Ambulatory Visit (HOSPITAL_COMMUNITY): Payer: Self-pay | Admitting: Chiropractic Medicine

## 2019-11-28 DIAGNOSIS — M5459 Other low back pain: Secondary | ICD-10-CM

## 2019-12-03 DIAGNOSIS — D519 Vitamin B12 deficiency anemia, unspecified: Secondary | ICD-10-CM | POA: Diagnosis not present

## 2019-12-03 DIAGNOSIS — M25561 Pain in right knee: Secondary | ICD-10-CM | POA: Diagnosis not present

## 2019-12-11 DIAGNOSIS — Z96651 Presence of right artificial knee joint: Secondary | ICD-10-CM | POA: Diagnosis not present

## 2019-12-12 ENCOUNTER — Other Ambulatory Visit: Payer: Self-pay

## 2019-12-12 ENCOUNTER — Inpatient Hospital Stay: Payer: Medicare Other | Attending: Oncology

## 2019-12-12 DIAGNOSIS — K9089 Other intestinal malabsorption: Secondary | ICD-10-CM | POA: Diagnosis not present

## 2019-12-12 DIAGNOSIS — D508 Other iron deficiency anemias: Secondary | ICD-10-CM | POA: Insufficient documentation

## 2019-12-12 DIAGNOSIS — Z9884 Bariatric surgery status: Secondary | ICD-10-CM | POA: Diagnosis not present

## 2019-12-12 DIAGNOSIS — D510 Vitamin B12 deficiency anemia due to intrinsic factor deficiency: Secondary | ICD-10-CM

## 2019-12-12 DIAGNOSIS — C73 Malignant neoplasm of thyroid gland: Secondary | ICD-10-CM

## 2019-12-12 LAB — CBC WITH DIFFERENTIAL/PLATELET
Abs Immature Granulocytes: 0 10*3/uL (ref 0.00–0.07)
Basophils Absolute: 0 10*3/uL (ref 0.0–0.1)
Basophils Relative: 1 %
Eosinophils Absolute: 0.1 10*3/uL (ref 0.0–0.5)
Eosinophils Relative: 4 %
HCT: 29.7 % — ABNORMAL LOW (ref 36.0–46.0)
Hemoglobin: 9.1 g/dL — ABNORMAL LOW (ref 12.0–15.0)
Immature Granulocytes: 0 %
Lymphocytes Relative: 30 %
Lymphs Abs: 1 10*3/uL (ref 0.7–4.0)
MCH: 29.6 pg (ref 26.0–34.0)
MCHC: 30.6 g/dL (ref 30.0–36.0)
MCV: 96.7 fL (ref 80.0–100.0)
Monocytes Absolute: 0.2 10*3/uL (ref 0.1–1.0)
Monocytes Relative: 7 %
Neutro Abs: 2 10*3/uL (ref 1.7–7.7)
Neutrophils Relative %: 58 %
Platelets: 209 10*3/uL (ref 150–400)
RBC: 3.07 MIL/uL — ABNORMAL LOW (ref 3.87–5.11)
RDW: 14.5 % (ref 11.5–15.5)
WBC: 3.4 10*3/uL — ABNORMAL LOW (ref 4.0–10.5)
nRBC: 0 % (ref 0.0–0.2)

## 2019-12-12 LAB — FERRITIN: Ferritin: 24 ng/mL (ref 11–307)

## 2019-12-12 LAB — RETICULOCYTES
Immature Retic Fract: 9.5 % (ref 2.3–15.9)
RBC.: 3.02 MIL/uL — ABNORMAL LOW (ref 3.87–5.11)
Retic Count, Absolute: 37.4 10*3/uL (ref 19.0–186.0)
Retic Ct Pct: 1.2 % (ref 0.4–3.1)

## 2019-12-12 LAB — VITAMIN B12: Vitamin B-12: 639 pg/mL (ref 180–914)

## 2019-12-14 LAB — ERYTHROPOIETIN: Erythropoietin: 64.7 m[IU]/mL — ABNORMAL HIGH (ref 2.6–18.5)

## 2019-12-15 ENCOUNTER — Other Ambulatory Visit: Payer: Self-pay

## 2019-12-15 ENCOUNTER — Other Ambulatory Visit: Payer: Self-pay | Admitting: Oncology

## 2019-12-15 ENCOUNTER — Encounter: Payer: Self-pay | Admitting: Podiatry

## 2019-12-15 ENCOUNTER — Ambulatory Visit (INDEPENDENT_AMBULATORY_CARE_PROVIDER_SITE_OTHER): Payer: Medicare Other | Admitting: Podiatry

## 2019-12-15 DIAGNOSIS — D510 Vitamin B12 deficiency anemia due to intrinsic factor deficiency: Secondary | ICD-10-CM | POA: Diagnosis not present

## 2019-12-15 DIAGNOSIS — B351 Tinea unguium: Secondary | ICD-10-CM

## 2019-12-15 DIAGNOSIS — M79674 Pain in right toe(s): Secondary | ICD-10-CM

## 2019-12-15 DIAGNOSIS — Z23 Encounter for immunization: Secondary | ICD-10-CM | POA: Diagnosis not present

## 2019-12-15 DIAGNOSIS — L84 Corns and callosities: Secondary | ICD-10-CM | POA: Diagnosis not present

## 2019-12-15 DIAGNOSIS — M79675 Pain in left toe(s): Secondary | ICD-10-CM | POA: Diagnosis not present

## 2019-12-15 NOTE — Progress Notes (Signed)
[facility]  [phone] [phone]     ID: Jillian Eaton DOB: 08-May-1950  MR#: [id number]  ZHY#:[id number]  Patient Care Team: Harvest Forest, MD as PCP - General (Internal Medicine) Rennis Golden Lisette Abu, MD as PCP - Cardiology (Cardiology) Marinus Maw, MD as PCP - Electrophysiology (Cardiology) Jeani Hawking, MD as Consulting Physician (Gastroenterology) Coralyn Helling, MD as Consulting Physician (Pulmonary Disease) Carlus Pavlov, MD as Consulting Physician (Internal Medicine) Tyler Pita, MD as Attending Physician (Internal Medicine) Freddie Breech, DPM as Consulting Physician (Podiatry) [name], MD as Consulting Physician (Obstetrics and Gynecology) Durene Romans, MD as Consulting Physician (Orthopedic Surgery) Van Clines, MD as Consulting Physician (Neurology) Lowella Dell, MD OTHER MD:  CHIEF COMPLAINT: multifactorial anemia  CURRENT TREATMENT: IV iron, b12 injections, oral folate   INTERVAL HISTORY: [name] returns today for follow up of her anemia.  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  These are being replaced and currently her ferritin is down to 24, is in the low normal range; B12 is 639, and EPO is adequate at 64.7 (all from 12/12/2019).  Her MCV is normal.  Her hemoglobin remains low.  Since the last visit she went off Keppra.  We thought that might be affecting the hemoglobin although going off does not seem to have made much difference.  She does say that she wanted to get off the medication anyway and that she has not noticed any change clinically herself going off the medication.    Finally [name] reports she felt a big surge of energy after getting her iron infusions previously.  She was hoping perhaps we could repeat that at this point.   Lab Results  Component Value Date   HGB 9.1 (L) 12/12/2019   HGB 9.9 (L) 08/07/2019    HGB 9.2 (L) 06/21/2019   HGB 9.1 (L) 06/17/2019   HGB 9.5 (L) 06/16/2019   We had stopped her Keppra to see if this normalized her hemoglobin but this has not had any effect.  REVIEW OF SYSTEMS: Rhonda    HISTORY OF CURRENT ILLNESS: From the original intake note:  Azura has a longstanding history of anemia and iron deficiency.  She has been followed by hematology several years ago.  She has had two separate bariatric surgeries which have interfered with her absorption of nutrients, vitamins, and minerals.  This has helped contribute to her anemia.  Marveline is a patient of Dr. Corky Downs who has been ordering her iron infusions and b12 injections.    Her B12 level is as follows:  Results for Birchler, [name]" (MRN [id number]) as of 05/15/2019 16:32  Ref. Range 07/21/2011 10:13 03/27/2012 11:17 07/15/2017 02:39 12/07/2018 15:15 05/15/2019 08:45  Vitamin B12 Latest Ref Range: 180 - 914 pg/mL 579 509 233 1,394 (H) 812    Her past iron studies and folate are is as follows: Results for Schweers, [name]" (MRN [id number]) as of 05/15/2019 16:32  Ref. Range 07/15/2017 02:39 12/07/2018 15:15 12/07/2018 15:16 05/15/2019 08:45 05/15/2019 11:13  Iron Latest Ref Range: 41 - 142 ug/dL 28 31  33 (L)   UIBC Latest Ref Range: 120 - 384 ug/dL 366 440  347   TIBC Latest Ref Range: 236 - 444 ug/dL 425 956  387   Saturation Ratios Latest Ref Range: 21 - 57 % 6 (L) 9 (L)  10 (L)   Ferritin Latest Ref Range: 11 - 307 ng/mL  15 65  117   Folate Latest Ref Range: >5.9 ng/mL   11.9  49.2   Harshini has had a worsening anemia, even despite receiving IV iron on 02/27/2019, 03/19/2019, and 04/07/2019.  She notes that previously she didn't require so many iron infusions.  She wants to know what else could be causing her anemia, and what could also help her feel better.    Her hemoglobin is as follows:  (note she started Keppra in 05/2018; had total knee replacement in 10/2018 and 04/2019) Results  for Copelin, [name] "RHONDA" (MRN [id number]) as of 05/15/2019 16:32  Ref. Range 07/15/2017 02:39 09/18/2017 15:01 01/03/2018 14:06 03/21/2018 13:46 03/30/2018 04:01 05/25/2018 19:29 08/01/2018 05:58 08/02/2018 06:40 08/04/2018 04:46 11/20/2018 15:48 11/27/2018 12:35 01/03/2019 18:06 02/11/2019 09:34 04/24/2019 10:51 04/30/2019 03:31 05/07/2019 12:48 05/15/2019 08:45  Hemoglobin Latest Ref Range: 12.0 - 15.0 g/dL 9.3 (L) 28.4 (L) 13.2 44.0 (L) 9.8 (L) 12.0 12.3 11.2 (L) 9.2 (L) 7.5 (L) 9.2 (L) 10.9 (L) 8.5 (L) 10.4 (L) 7.6 (L) 8.5 (L) 8.9 (L)    The patient's subsequent history is as detailed below.   PAST MEDICAL HISTORY: Past Medical History:  Diagnosis Date  . Anemia    iron and pernicious  . Arthritis   . CHF (congestive heart failure) (HCC) 2018  . Complication of anesthesia    BP dropped   . DOE (dyspnea on exertion)    2D ECHO, 02/12/2012 - EF 60-65%, moderate concentric hypertrophy  . Dyspnea   . Dysrhythmia    a-fib  . Fibromyalgia    nerve pain"left side at waist level" "can't lay on that side without pain" , "HOB elevation helps"; pt. thinks this has resolved after 200lb weight loss9-17- since i lost the wieght , i dont  think i have this anymore   . Headache 04/2019  . Heart murmur   . Hematuria - cause not known    resolved   . History of kidney stones    x 2 '13, '14 surgery to remove  . Pneumonia    aspiration  04/2016  . Presence of permanent cardiac pacemaker   . SBO (small bowel obstruction) (HCC)    rqueired admission 2020  . Thyroid disease    "goiter"  . Transfusion history    10 yrs+    PAST SURGICAL HISTORY: Past Surgical History:  Procedure Laterality Date  . ABDOMINAL ADHESION SURGERY  2004   open LOA - in [place]  . AV NODE ABLATION N/A 06/20/2019   Procedure: AV NODE ABLATION;  Surgeon: Marinus Maw, MD;  Location: [facility] INVASIVE CV LAB;  Service: Cardiovascular;  Laterality: N/A;  . BALLOON DILATION N/A 07/25/2019   Procedure: BALLOON DILATION;  Surgeon:  Jeani Hawking, MD;  Location: [facility] ENDOSCOPY;  Service: Endoscopy;  Laterality: N/A;  . CARDIAC CATHETERIZATION  04/04/2010   No significant obstructive coronary artery disease  . CARDIOVERSION N/A 08/08/2017   Procedure: CARDIOVERSION;  Surgeon: Chrystie Nose, MD;  Location: [facility] ENDOSCOPY;  Service: Cardiovascular;  Laterality: N/A;  . CARDIOVERSION N/A 06/18/2019   Procedure: CARDIOVERSION;  Surgeon: Chrystie Nose, MD;  Location: [facility] ENDOSCOPY;  Service: Cardiovascular;  Laterality: N/A;  . CHOLECYSTECTOMY  1990  . COLONOSCOPY W/ POLYPECTOMY    . COLONOSCOPY WITH PROPOFOL N/A 04/10/2014   Procedure: COLONOSCOPY WITH PROPOFOL;  Surgeon: Theda Belfast, MD;  Location: [facility] ENDOSCOPY;  Service: Endoscopy;  Laterality: N/A;  . CYSTOSCOPY/URETEROSCOPY/HOLMIUM LASER/STENT PLACEMENT Right 08/13/2019   Procedure: CYSTOSCOPY/RETROGRADE/URETEROSCOPY/HOLMIUM LASER/STENT PLACEMENT;  Surgeon: Liliane Shi,  Dorian Furnace, MD;  Location: [facility] ORS;  Service: Urology;  Laterality: Right;  ONLY NEEDS 45 MIN  . ESOPHAGOGASTRODUODENOSCOPY (EGD) WITH PROPOFOL N/A 07/25/2019   Procedure: ESOPHAGOGASTRODUODENOSCOPY (EGD) WITH PROPOFOL;  Surgeon: Jeani Hawking, MD;  Location: [facility] ENDOSCOPY;  Service: Endoscopy;  Laterality: N/A;  . EYE SURGERY     lasik 20-25 yrs ago  . GASTROPLASTY VERTICAL BANDED  1985  . LAPAROSCOPIC LYSIS INTESTINAL ADHESIONS  1995   In [place] for SBO  . MALONEY DILATION  07/25/2019   Procedure: Elease Hashimoto DILATION;  Surgeon: Jeani Hawking, MD;  Location: [facility] ENDOSCOPY;  Service: Endoscopy;;  . PACEMAKER IMPLANT N/A 06/20/2019   Procedure: PACEMAKER IMPLANT;  Surgeon: Marinus Maw, MD;  Location: [facility] INVASIVE CV LAB;  Service: Cardiovascular;  Laterality: N/A;  . REVERSE SHOULDER ARTHROPLASTY Right 03/29/2018   Procedure: REVERSE SHOULDER ARTHROPLASTY;  Surgeon: Beverely Low, MD;  Location: [facility] OR;  Service: Orthopedics;  Laterality: Right;  . ROUX-EN-Y GASTRIC BYPASS  1992   Conversion VBG to RnYGB in  [place]  . SHOULDER ARTHROSCOPY WITH SUBACROMIAL DECOMPRESSION  2016   Dr Luiz Blare, [place], [place]  . SMALL INTESTINE SURGERY  2000   SBO - LOA w SB resection  . TOTAL KNEE ARTHROPLASTY Left 11/14/2018   Procedure: TOTAL KNEE ARTHROPLASTY;  Surgeon: Durene Romans, MD;  Location: [facility] ORS;  Service: Orthopedics;  Laterality: Left;  70 mins  . TOTAL KNEE ARTHROPLASTY Right 04/29/2019   Procedure: TOTAL KNEE ARTHROPLASTY;  Surgeon: Durene Romans, MD;  Location: [facility] ORS;  Service: Orthopedics;  Laterality: Right;  70 mins  . TOTAL THYROIDECTOMY  06/26/2012   Dr Lady Gary, [facility]  . TUBAL LIGATION  1986  . URETHRAL DILATION  2012   w cystoscopy.  Dr Patsi Sears  . UTERINE FIBROID EMBOLIZATION      FAMILY HISTORY Family History  Problem Relation Age of Onset  . Diabetes Mother   . Epilepsy Mother   . Cancer Mother        Breast  . Hypertension Mother   . Breast cancer Mother   . Seizures Mother   . Kidney disease Father   . Diabetes Father   . Hypertension Father   . Asthma Father   . Heart disease Father   . Epilepsy Sister   . Cancer Maternal Grandmother   . Breast cancer Maternal Grandmother   . Cancer Paternal Grandmother   . Breast cancer Paternal Grandmother     GYNECOLOGIC HISTORY:  No LMP recorded. Patient is postmenopausal. Menarche: 69 years old Age at first live birth: 69 years old GX P 3 LMP uterine embolization at age 12 Contraceptive used for 14 years in the past HRT no  Hysterectomy? no Salpingo-oophorectomy?no   SOCIAL HISTORY:  [name] is retired and lives in [place] with her husband and dogs.  She does not have any current hobbies.    ADVANCED DIRECTIVES: In the absence of any documentation to the contrary, the patient's spouse is their HCPOA.    HEALTH MAINTENANCE: Social History   Tobacco Use  . Smoking status: Never Smoker  . Smokeless tobacco: Never Used  Vaping Use  . Vaping Use: Never used  Substance Use Topics  . Alcohol  use: No    Comment: wine occ  . Drug use: No    Types: Oxycodone    Comment: perscribed     Colonoscopy: 2016 Dr. Elnoria Howard  PAP: 3-4 years ago, h/o abnormal pap and LEEP x 2, hasn't returned for pap  Bone density:11/2017  osteoporosis--on prolia every 6 months   Allergies  Allergen Reactions  . Hydrocodone Itching         Current Outpatient Medications  Medication Sig Dispense Refill  . albuterol (VENTOLIN HFA) 108 (90 Base) MCG/ACT inhaler Inhale 2 puffs into the lungs every 6 (six) hours as needed for wheezing or shortness of breath.    Marland Kitchen apixaban (ELIQUIS) 5 MG TABS tablet Take 1 tablet (5 mg total) by mouth 2 (two) times daily. Please resume eliquis on 5/5 pm, 30 tablet 0  . calcitRIOL (ROCALTROL) 0.25 MCG capsule Take 1 capsule (0.25 mcg total) by mouth daily. 90 capsule 3  . clotrimazole (LOTRIMIN) 1 % cream APPLY TO AFFECTED AREA TWICE A DAY (Patient taking differently: Apply 1 application topically 2 (two) times daily as needed (affected area(s) of feet). ) 45 g 0  . cyanocobalamin (,VITAMIN B-12,) 1000 MCG/ML injection Inject 1 mL (1,000 mcg total) into the muscle every 30 (thirty) days. 1 mL 0  . cyclobenzaprine (FLEXERIL) 10 MG tablet Take 20 mg by mouth at bedtime.     . DULoxetine (CYMBALTA) 60 MG capsule Take 1 capsule (60 mg total) by mouth daily. 30 capsule 0  . eszopiclone (LUNESTA) 2 MG TABS tablet Take 1 tablet (2 mg total) by mouth at bedtime. 20 tablet 0  . FOLIC ACID PO Take 1 tablet by mouth daily.    . furosemide (LASIX) 20 MG tablet Take 1 tablet (20 mg total) by mouth as needed. (Patient taking differently: Take 20 mg by mouth daily as needed (fluid retention). ) 90 tablet 2  . levothyroxine (SYNTHROID) 137 MCG tablet TAKE 1 TABLET (137 MCG TOTAL) BY MOUTH DAILY BEFORE BREAKFAST. 90 tablet 1  . magnesium oxide (MAG-OX) 400 (241.3 Mg) MG tablet Take 400 mg by mouth daily.     . montelukast (SINGULAIR) 10 MG tablet Take 1 tablet (10 mg total) by mouth at bedtime. 30  tablet 5  . NONFORMULARY OR COMPOUNDED ITEM [facility]:  Antifungal Cream - Terbinafine 3%, Fluconazole 2%, Tea Tree Oil 5%, Urea 10%, Ibuprofen 2% in DMSO Suspension #24ml. Apply to affected toenail(s) once daily (at bedtime) or twice daily. (Patient taking differently: Apply 1 application topically 2 (two) times daily as needed (affected area(s) of feet.). [facility]:  Antifungal Cream - Terbinafine 3%, Fluconazole 2%, Tea Tree Oil 5%, Urea 10%, Ibuprofen 2% in DMSO Suspension #16ml. Apply to affected toenail(s) once daily (at bedtime) or twice daily.) 30 each 11  . traZODone (DESYREL) 50 MG tablet Take 1 tablet (50 mg total) by mouth at bedtime. 30 tablet 0  . Vitamin D, Ergocalciferol, (DRISDOL) 1.25 MG (50000 UT) CAPS capsule Take 1 capsule (50,000 Units total) by mouth every Wednesday. 4 capsule 0   No current facility-administered medications for this visit.    OBJECTIVE: African-American woman who appears well  Vitals:   12/16/19 0933  BP: 121/75  Pulse: 83  Resp: 18  Temp: (!) 97 F (36.1 C)  SpO2: 97%     Body mass index is 31.07 kg/m.   Wt Readings from Last 3 Encounters:  12/16/19 198 lb 6.4 oz (90 kg)  10/16/19 192 lb (87.1 kg)  09/24/19 201 lb (91.2 kg)  ECOG FS:2 - Symptomatic, <50% confined to bed  Sclerae unicteric, EOMs intact Wearing a mask No cervical or supraclavicular adenopathy Lungs no rales or rhonchi Heart regular rate and rhythm Abd soft, nontender, positive bowel sounds MSK no focal spinal tenderness, no upper extremity lymphedema Neuro: nonfocal,  well oriented, appropriate affect   {LAB RESULTS:  CMP     Component Value Date/Time   NA 139 08/07/2019 1155   NA 141 11/20/2018 1548   K 4.1 08/07/2019 1155   CL 106 08/07/2019 1155   CO2 25 08/07/2019 1155   GLUCOSE 79 08/07/2019 1155   BUN 20 08/07/2019 1155   BUN 21 11/20/2018 1548   CREATININE 1.05 (H) 08/07/2019 1155   CREATININE 1.23 (H) 05/15/2019 0845   CREATININE  1.01 (H) 11/27/2017 1029   CALCIUM 7.9 (L) 08/07/2019 1155   CALCIUM 5.5 (LL) 07/10/2017 2104   PROT 6.0 (L) 06/16/2019 0555   PROT 6.8 06/19/2017 1222   ALBUMIN 2.1 (L) 06/16/2019 0555   ALBUMIN 3.8 06/19/2017 1222   AST 13 (L) 06/16/2019 0555   AST 18 05/15/2019 0845   ALT 9 06/16/2019 0555   ALT 9 05/15/2019 0845   ALKPHOS 70 06/16/2019 0555   BILITOT 0.6 06/16/2019 0555   BILITOT 0.5 05/15/2019 0845   GFRNONAA 55 (L) 08/07/2019 1155   GFRNONAA 46 (L) 05/15/2019 0845   GFRNONAA 58 (L) 11/27/2017 1029   GFRAA >60 08/07/2019 1155   GFRAA 53 (L) 05/15/2019 0845   GFRAA 68 11/27/2017 1029    Lab Results  Component Value Date   TOTALPROTELP 7.0 06/01/2010   ALBUMINELP 53.7 (L) 06/01/2010   A1GS 4.7 06/01/2010   A2GS 10.9 06/01/2010   BETS 7.7 (H) 06/01/2010   BETA2SER 6.2 06/01/2010   GAMS 16.8 06/01/2010   MSPIKE NOT DET 06/01/2010   SPEI * 06/01/2010    No results found for: KPAFRELGTCHN, LAMBDASER, KAPLAMBRATIO  Lab Results  Component Value Date   WBC 3.4 (L) 12/12/2019   NEUTROABS 2.0 12/12/2019   HGB 9.1 (L) 12/12/2019   HCT 29.7 (L) 12/12/2019   MCV 96.7 12/12/2019   PLT 209 12/12/2019      Chemistry      Component Value Date/Time   NA 139 08/07/2019 1155   NA 141 11/20/2018 1548   K 4.1 08/07/2019 1155   CL 106 08/07/2019 1155   CO2 25 08/07/2019 1155   BUN 20 08/07/2019 1155   BUN 21 11/20/2018 1548   CREATININE 1.05 (H) 08/07/2019 1155   CREATININE 1.23 (H) 05/15/2019 0845   CREATININE 1.01 (H) 11/27/2017 1029   GLU 97 05/16/2016 0000      Component Value Date/Time   CALCIUM 7.9 (L) 08/07/2019 1155   CALCIUM 5.5 (LL) 07/10/2017 2104   ALKPHOS 70 06/16/2019 0555   AST 13 (L) 06/16/2019 0555   AST 18 05/15/2019 0845   ALT 9 06/16/2019 0555   ALT 9 05/15/2019 0845   BILITOT 0.6 06/16/2019 0555   BILITOT 0.5 05/15/2019 0845       No results found for: LABCA2  No components found for: ZOXWRU045  No results for input(s): INR in the last  168 hours.  No results found for: LABCA2  No results found for: WUJ811  No results found for: BJY782  No results found for: NFA213  No results found for: CA2729  No components found for: HGQUANT  No results found for: CEA1 / No results found for: CEA1   No results found for: AFPTUMOR  No results found for: [facility]  Lab Results  Component Value Date   HGBA 97.4 06/01/2010   HGBA2QUANT 2.6 06/01/2010   HGBFQUANT 0.0 06/01/2010   HGBSQUAN 0.0 06/01/2010   (Hemoglobinopathy evaluation)   Lab Results  Component Value Date   LDH 133 06/16/2019  Lab Results  Component Value Date   IRON 39 06/16/2019   TIBC 251 06/16/2019   IRONPCTSAT 16 06/16/2019   (Iron and TIBC)  Lab Results  Component Value Date   FERRITIN 24 12/12/2019    Urinalysis    Component Value Date/Time   COLORURINE YELLOW 01/03/2019 2248   APPEARANCEUR HAZY (A) 01/03/2019 2248   LABSPEC 1.026 01/03/2019 2248   PHURINE 5.0 01/03/2019 2248   GLUCOSEU NEGATIVE 01/03/2019 2248   HGBUR NEGATIVE 01/03/2019 2248   BILIRUBINUR NEGATIVE 01/03/2019 2248   KETONESUR 5 (A) 01/03/2019 2248   PROTEINUR NEGATIVE 01/03/2019 2248   UROBILINOGEN 1.0 06/06/2013 2154   NITRITE NEGATIVE 01/03/2019 2248   LEUKOCYTESUR TRACE (A) 01/03/2019 2248    STUDIES: No results found.   ASSESSMENT: 69 y.o. woman with multiple medical issues who is here today for evalution of her persistent anemia despite iron deficiency  1. 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 Corky Downs  (b) B12 injections are given monthly and her level is stable  (c) on an oral folate supplement and the level is normal  2. Persistent anemia despite adequate replacement of the above  (a) Reticulocyte count on 3/25 is low considering her ferritin is over 100  (b) further lab work up with: DAT, LDH, haptoglobin, ESR, ANA, CRP all WNL 05/14/2009  (c)  anemia possibly related to Keppra seizure medication--medication discontinued 09/13/2019 with no significant improvement.  (d) subjective clinical improvement with iron supplementation   PLAN: I am still not entirely sure the cause of Rhonda's anemia.  It is most consistent with anemia of chronic illness.  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  She did have clinically a response to iron infusion.  Her hemoglobin went up to almost 10.  Her ferritin is down to 24.  I think we could repeat Feraheme x2 and then see where things go.  This is very much what she had in mind.  She will have Feraheme next week and the week following.  She will return here in about 6 months and we will do lab work a week before that visit  She knows to call for any other issue that may develop before then  Total encounter time 25 minutes.Raymond Gurney C. Lynden Carrithers, MD 12/16/19 9:49 AM Medical Oncology and Hematology [facility] [address] [place], [place] [address] Tel. [phone]    Fax. [phone]   I, Mickie Bail, am acting as scribe for Dr. Valentino Hue. Chabely Norby.  I, Ruthann Cancer MD, have reviewed the above documentation for accuracy and completeness, and I agree with the above.   *Total Encounter Time as defined by the Centers for Medicare and Medicaid Services includes, in addition to the face-to-face time of a patient visit (documented in the note above) non-face-to-face time: obtaining and reviewing outside history, ordering and reviewing medications, tests or procedures, care coordination (communications with other health care professionals or caregivers) and documentation in the medical record.

## 2019-12-16 ENCOUNTER — Inpatient Hospital Stay (HOSPITAL_BASED_OUTPATIENT_CLINIC_OR_DEPARTMENT_OTHER): Payer: Medicare Other | Admitting: Oncology

## 2019-12-16 ENCOUNTER — Other Ambulatory Visit: Payer: Self-pay

## 2019-12-16 VITALS — BP 121/75 | HR 83 | Temp 97.0°F | Resp 18 | Ht 67.0 in | Wt 198.4 lb

## 2019-12-16 DIAGNOSIS — K9089 Other intestinal malabsorption: Secondary | ICD-10-CM | POA: Diagnosis not present

## 2019-12-16 DIAGNOSIS — Z9884 Bariatric surgery status: Secondary | ICD-10-CM | POA: Diagnosis not present

## 2019-12-16 DIAGNOSIS — D508 Other iron deficiency anemias: Secondary | ICD-10-CM | POA: Diagnosis not present

## 2019-12-17 ENCOUNTER — Other Ambulatory Visit: Payer: Self-pay | Admitting: Internal Medicine

## 2019-12-17 DIAGNOSIS — M25561 Pain in right knee: Secondary | ICD-10-CM | POA: Diagnosis not present

## 2019-12-17 DIAGNOSIS — Z1231 Encounter for screening mammogram for malignant neoplasm of breast: Secondary | ICD-10-CM

## 2019-12-19 ENCOUNTER — Ambulatory Visit (INDEPENDENT_AMBULATORY_CARE_PROVIDER_SITE_OTHER): Payer: Medicare Other

## 2019-12-19 DIAGNOSIS — H04123 Dry eye syndrome of bilateral lacrimal glands: Secondary | ICD-10-CM | POA: Diagnosis not present

## 2019-12-19 DIAGNOSIS — H25013 Cortical age-related cataract, bilateral: Secondary | ICD-10-CM | POA: Diagnosis not present

## 2019-12-19 DIAGNOSIS — I442 Atrioventricular block, complete: Secondary | ICD-10-CM

## 2019-12-19 DIAGNOSIS — H2513 Age-related nuclear cataract, bilateral: Secondary | ICD-10-CM | POA: Diagnosis not present

## 2019-12-19 DIAGNOSIS — H35033 Hypertensive retinopathy, bilateral: Secondary | ICD-10-CM | POA: Diagnosis not present

## 2019-12-19 LAB — CUP PACEART REMOTE DEVICE CHECK
Battery Remaining Longevity: 142 mo
Battery Voltage: 3.16 V
Brady Statistic AP VP Percent: 0 %
Brady Statistic AP VS Percent: 0 %
Brady Statistic AS VP Percent: 99.96 %
Brady Statistic AS VS Percent: 0.04 %
Brady Statistic RA Percent Paced: 0 %
Brady Statistic RV Percent Paced: 99.96 %
Date Time Interrogation Session: 20211028222904
Implantable Lead Implant Date: 20210430
Implantable Lead Location: 753860
Implantable Lead Model: 3830
Implantable Pulse Generator Implant Date: 20210430
Lead Channel Impedance Value: 3382 Ohm
Lead Channel Impedance Value: 3382 Ohm
Lead Channel Impedance Value: 399 Ohm
Lead Channel Impedance Value: 570 Ohm
Lead Channel Pacing Threshold Amplitude: 0.625 V
Lead Channel Pacing Threshold Pulse Width: 0.4 ms
Lead Channel Sensing Intrinsic Amplitude: 15.875 mV
Lead Channel Sensing Intrinsic Amplitude: 15.875 mV
Lead Channel Setting Pacing Amplitude: 2.5 V
Lead Channel Setting Pacing Pulse Width: 0.4 ms
Lead Channel Setting Sensing Sensitivity: 4 mV

## 2019-12-19 LAB — HM DIABETES EYE EXAM

## 2019-12-20 NOTE — Progress Notes (Signed)
Subjective:  Patient ID: Jillian Eaton, female    DOB: [birth year],  MRN: [id number]  69 y.o. female presents with painful corn(s) b/l feet and painful thick toenails that are difficult to trim. Pain interferes with ambulation. Aggravating factors include wearing enclosed shoe gear. Pain is relieved with periodic professional debridement.   [name], MD is her PCP. Last visit was 10/07/2019.  She has h/o pernicious anemia. Receives B-12 injections.  Review of Systems: Negative except as noted in the HPI. Denies N/V/F/Ch. Past Medical History:  Diagnosis Date  . Anemia    iron and pernicious  . Arthritis   . CHF (congestive heart failure) ([place]) 2018  . Complication of anesthesia    BP dropped   . DOE (dyspnea on exertion)    2D ECHO, 02/12/2012 - EF 60-65%, moderate concentric hypertrophy  . Dyspnea   . Dysrhythmia    a-fib  . Fibromyalgia    nerve pain"left side at waist level" "can't lay on that side without pain" , "HOB elevation helps"; pt. thinks this has resolved after 200lb weight loss9-17- since i lost the wieght , i dont  think i have this anymore   . Headache 04/2019  . Heart murmur   . Hematuria - cause not known    resolved   . History of kidney stones    x 2 '13, '14 surgery to remove  . Pneumonia    aspiration  04/2016  . Presence of permanent cardiac pacemaker   . SBO (small bowel obstruction) ([place])    rqueired admission 2020  . Thyroid disease    "goiter"  . Transfusion history    10 yrs+   Past Surgical History:  Procedure Laterality Date  . ABDOMINAL ADHESION SURGERY  2004   open LOA - in [place]  . AV NODE ABLATION N/A 06/20/2019   Procedure: AV NODE ABLATION;  Surgeon: Evans Lance, MD;  Location: [facility];  Service: Cardiovascular;  Laterality: N/A;  . BALLOON DILATION N/A 07/25/2019   Procedure: BALLOON DILATION;  Surgeon: Carol Ada, MD;  Location: [facility];  Service: Endoscopy;  Laterality: N/A;  . CARDIAC  CATHETERIZATION  04/04/2010   No significant obstructive coronary artery disease  . CARDIOVERSION N/A 08/08/2017   Procedure: CARDIOVERSION;  Surgeon: Pixie Casino, MD;  Location[facility];  Service: Cardiovascular;  Laterality: N/A;  . CARDIOVERSION N/A 06/18/2019   Procedure: CARDIOVERSION;  Surgeon: Pixie Casino, MD;  Location: [place];  Service: Cardiovascular;  Laterality: N/A;  . CHOLECYSTECTOMY  1990  . COLONOSCOPY W/ POLYPECTOMY    . COLONOSCOPY WITH PROPOFOL N/A 04/10/2014   Procedure: COLONOSCOPY WITH PROPOFOL;  Surgeon: Beryle Beams, MD;  Location: [facility];  Service: Endoscopy;  Laterality: N/A;  . CYSTOSCOPY/URETEROSCOPY/HOLMIUM LASER/STENT PLACEMENT Right 08/13/2019   Procedure: CYSTOSCOPY/RETROGRADE/URETEROSCOPY/HOLMIUM LASER/STENT PLACEMENT;  Surgeon: Ceasar Mons, MD;  Location: [facility];  Service: Urology;  Laterality: Right;  ONLY NEEDS 45 MIN  . ESOPHAGOGASTRODUODENOSCOPY (EGD) WITH PROPOFOL N/A 07/25/2019   Procedure: ESOPHAGOGASTRODUODENOSCOPY (EGD) WITH PROPOFOL;  Surgeon: Carol Ada, MD;  Location: [facility];  Service: Endoscopy;  Laterality: N/A;  . EYE SURGERY     lasik 20-25 yrs ago  . GASTROPLASTY VERTICAL BANDED  1985  . [place]   In [place] for SBO  . MALONEY DILATION  07/25/2019   Procedure: Venia Minks DILATION;  Surgeon: Carol Ada, MD;  Location: [facility];  Service: Endoscopy;;  . PACEMAKER IMPLANT N/A 06/20/2019  Procedure: PACEMAKER IMPLANT;  Surgeon: Evans Lance, MD;  Location: [place] [facility];  Service: Cardiovascular;  Laterality: N/A;  . REVERSE SHOULDER ARTHROPLASTY Right 03/29/2018   Procedure: REVERSE SHOULDER ARTHROPLASTY;  Surgeon: Netta Cedars, MD;  Location: [facility];  Service: Orthopedics;  Laterality: Right;  . ROUX-EN-Y GASTRIC BYPASS  1992   Conversion VBG to RnYGB in [place], [place]  . SHOULDER ARTHROSCOPY WITH SUBACROMIAL DECOMPRESSION  2016   Dr Berenice Primas,  [place], [place]  . SMALL INTESTINE SURGERY  2000   SBO - LOA w SB resection  . TOTAL KNEE ARTHROPLASTY Left 11/14/2018   Procedure: TOTAL KNEE ARTHROPLASTY;  Surgeon: Paralee Cancel, MD;  Location: [facility];  Service: Orthopedics;  Laterality: Left;  70 mins  . TOTAL KNEE ARTHROPLASTY Right 04/29/2019   Procedure: TOTAL KNEE ARTHROPLASTY;  Surgeon: Paralee Cancel, MD;  Location: [facility];  Service: Orthopedics;  Laterality: Right;  70 mins  . TOTAL THYROIDECTOMY  06/26/2012   Dr Celine Ahr, [facility]  . TUBAL LIGATION  1986  . URETHRAL DILATION  2012   w cystoscopy.  Dr Gaynelle Arabian  . UTERINE FIBROID EMBOLIZATION     Patient Active Problem List   Diagnosis Date Noted  . Complete heart block ([place]) 09/24/2019  . Pacemaker 09/24/2019  . Iron deficiency anemia 09/15/2019  . Atrial fibrillation with RVR ([place]) 06/15/2019  . Fatigue 06/14/2019  . S/P right TKA 04/29/2019  . Medication management 03/11/2019  . Stiffness of left knee 12/12/2018  . Acute on chronic respiratory failure with hypoxia ([place]) 12/07/2018  . Normocytic anemia 12/07/2018  . Asthma 12/07/2018  . S/P left TKA 11/14/2018  . Status post total left knee replacement 11/14/2018  . Degenerative joint disease involving multiple joints on both sides of body 07/31/2018  . Rotator cuff tear arthropathy of right shoulder 04/05/2018  . Overactive bladder 04/05/2018  . Steroid-induced hyperglycemia   . Generalized OA   . 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 NICM (nonischemic cardiomyopathy) ([place]) 01/15/2018  . Osteoporosis 12/13/2017  . Rotator cuff tear arthropathy 11/29/2017  . Chronic diastolic CHF (congestive heart failure) ([place]) 09/28/2017  . Acute renal failure superimposed on stage 3 chronic kidney disease ([place]) 09/07/2017  . Hypomagnesemia with secondary hypocalcemia 09/07/2017  . 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 Hypercapnemia 07/13/2017  . AKI (acute kidney injury) ([place]) 07/10/2017  .  Vertigo 07/08/2017  . Blurring of visual image 07/08/2017  . On anticoagulant therapy 06/19/2017  . Chronic respiratory failure with hypoxia ([place]) 04/19/2017  . GERD (gastroesophageal reflux disease) 08/28/2016  . 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 Depressive disorder 08/28/2016  . Iatrogenic hypocalcemia 08/28/2016  . Chronic pain syndrome   . Acute diastolic (congestive) heart failure ([place])   . Oropharyngeal dysphagia   . Chronic atrial fibrillation ([place]) 04/28/2016  . Vocal cord dysfunction 04/28/2016  . Thrombocytopenia ([place]) 04/28/2016  . Seizures ([place])   . Preoperative cardiovascular examination   . 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  . Leg swelling 10/19/2015  . Acute on chronic respiratory failure with hypoxia and hypercapnia ([place]) 08/03/2015  . Bilateral leg edema 05/11/2015  . Essential hypertension 05/11/2015  . Nephrolithiasis 04/12/2015  . Numbness in both hands 04/12/2015  . Complete tear of left rotator cuff 11/19/2014  . Primary osteoarthritis of both knees 03/24/2014  . Chronic asthmatic bronchitis ([place]) 07/30/2013  . Bariatric surgery status 07/30/2013  . Urge incontinence of urine 07/30/2013  . Vitamin D deficiency 07/30/2013  . B-complex deficiency 07/30/2013  .  Cough 07/30/2013  . Gross hematuria 07/30/2013  . Palpitations 07/30/2013  . Right flank pain 07/30/2013  . SBO (small bowel obstruction) ([place]) 06/07/2013  . Pulmonary HTN ([place]) 01/08/2013  . Insomnia 03/22/2012  . Mild intermittent asthma with acute exacerbation 03/22/2012  . Fibromyalgia 10/26/2011  . Anemia of chronic disease 03/28/2011  . Obesity (BMI 30-39.9) 09/22/2010    Current Outpatient Medications:  .  albuterol (VENTOLIN HFA) 108 (90 Base) MCG/ACT inhaler, Inhale 2 puffs into the lungs every 6 (six) hours as needed for wheezing or shortness of breath., Disp: , Rfl:  .  apixaban (ELIQUIS) 5 MG TABS tablet, Take 1 tablet (5 mg total) by mouth 2 (two) times daily.  Please resume eliquis on 5/5 pm,, Disp: 30 tablet, Rfl: 0 .  calcitRIOL (ROCALTROL) 0.25 MCG capsule, Take 1 capsule (0.25 mcg total) by mouth daily., Disp: 90 capsule, Rfl: 3 .  clotrimazole (LOTRIMIN) 1 % cream, APPLY TO AFFECTED AREA TWICE A DAY (Patient taking differently: Apply 1 application topically 2 (two) times daily as needed (affected area(s) of feet). ), Disp: 45 g, Rfl: 0 .  cyanocobalamin (,VITAMIN B-12,) 1000 MCG/ML injection, Inject 1 mL (1,000 mcg total) into the muscle every 30 (thirty) days., Disp: 1 mL, Rfl: 0 .  cyclobenzaprine (FLEXERIL) 10 MG tablet, Take 20 mg by mouth at bedtime. , Disp: , Rfl:  .  DULoxetine (CYMBALTA) 60 MG capsule, Take 1 capsule (60 mg total) by mouth daily., Disp: 30 capsule, Rfl: 0 .  eszopiclone (LUNESTA) 2 MG TABS tablet, Take 1 tablet (2 mg total) by mouth at bedtime., Disp: 20 tablet, Rfl: 0 .  FOLIC ACID PO, Take 1 tablet by mouth daily., Disp: , Rfl:  .  levothyroxine (SYNTHROID) 137 MCG tablet, TAKE 1 TABLET (137 MCG TOTAL) BY MOUTH DAILY BEFORE BREAKFAST., Disp: 90 tablet, Rfl: 1 .  magnesium oxide (MAG-OX) 400 (241.3 Mg) MG tablet, Take 400 mg by mouth daily. , Disp: , Rfl:  .  montelukast (SINGULAIR) 10 MG tablet, Take 1 tablet (10 mg total) by mouth at bedtime., Disp: 30 tablet, Rfl: 5 .  traZODone (DESYREL) 50 MG tablet, Take 1 tablet (50 mg total) by mouth at bedtime., Disp: 30 tablet, Rfl: 0 .  Vitamin D, Ergocalciferol, (DRISDOL) 1.25 MG (50000 UT) CAPS capsule, Take 1 capsule (50,000 Units total) by mouth every Wednesday., Disp: 4 capsule, Rfl: 0 .  furosemide (LASIX) 20 MG tablet, Take 1 tablet (20 mg total) by mouth as needed. (Patient taking differently: Take 20 mg by mouth daily as needed (fluid retention). ), Disp: 90 tablet, Rfl: 2 .  NONFORMULARY OR COMPOUNDED ITEM, [facility]:  Antifungal Cream - Terbinafine 3%, Fluconazole 2%, Tea Tree Oil 5%, Urea 10%, Ibuprofen 2% in DMSO Suspension #39ml. Apply to affected toenail(s)  once daily (at bedtime) or twice daily. (Patient taking differently: Apply 1 application topically 2 (two) times daily as needed (affected area(s) of feet.). [facility]:  Antifungal Cream - Terbinafine 3%, Fluconazole 2%, Tea Tree Oil 5%, Urea 10%, Ibuprofen 2% in DMSO Suspension #22ml. Apply to affected toenail(s) once daily (at bedtime) or twice daily.), Disp: 30 each, Rfl: 11 Allergies  Allergen Reactions  . Hydrocodone Itching        Social History   Tobacco Use  Smoking Status Never Smoker  Smokeless Tobacco Never Used    Objective:   Constitutional Pt is a pleasant 69 y.o. African American female, in NAD.Marland Kitchen  Vascular Capillary refill time to digits immediate b/l. Palpable pedal  pulses b/l LE. Pedal hair absent. Lower extremity skin temperature gradient within normal limits. No cyanosis or clubbing noted.  Neurologic Normal speech. Oriented to person, place, and time. Protective sensation intact 5/5 intact bilaterally with 10g monofilament b/l. Vibratory sensation intact b/l.  Dermatologic Pedal skin with normal turgor, texture and tone bilaterally. No open wounds bilaterally. No interdigital macerations bilaterally. Toenails 1-5 b/l elongated, discolored, dystrophic, thickened, crumbly with subungual debris and tenderness to dorsal palpation. Hyperkeratotic lesion(s) L 5th toe and R 5th toe.  No erythema, no edema, no drainage, no flocculence.  Orthopedic: Normal muscle strength 5/5 to all lower extremity muscle groups bilaterally. No pain crepitus or joint limitation noted with ROM b/l. Hammertoes noted to the L 5th toe and R 5th toe.   Radiographs: None Assessment:   1. Pain due to onychomycosis of toenails of both feet   2. Corns   3. Pernicious anemia    Plan:  Patient was evaluated and treated and all questions answered.  Onychomycosis with pain -Nails palliatively debridement as below.  Procedure: Nail Debridement Rationale: Pain Type of Debridement: manual,  sharp debridement. Instrumentation: Nail nipper, rotary burr. Number of Nails: 10  -Examined patient. -No new findings. No new orders. -Toenails 1-5 b/l were debrided in length and girth with sterile nail nippers and dremel without iatrogenic bleeding.  -Corn(s) L 5th toe and R 5th toe pared utilizing sterile scalpel blade without complication or incident. Total number debrided=2. -Patient to continue soft, supportive shoe gear daily. -Patient/POA to call should there be question/concern in the interim.  Return in about 3 months (around 03/16/2020).  Marzetta Board, DPM

## 2019-12-21 ENCOUNTER — Other Ambulatory Visit: Payer: Self-pay | Admitting: Cardiology

## 2019-12-21 MED ORDER — APIXABAN 5 MG PO TABS: 5.0000 mg | ORAL_TABLET | Freq: Two times a day (BID) | ORAL | 0 refills | Status: DC

## 2019-12-23 ENCOUNTER — Other Ambulatory Visit: Payer: Self-pay

## 2019-12-23 ENCOUNTER — Inpatient Hospital Stay: Payer: Medicare Other | Attending: Oncology

## 2019-12-23 VITALS — BP 112/75 | HR 68 | Temp 98.4°F | Resp 17

## 2019-12-23 DIAGNOSIS — D508 Other iron deficiency anemias: Secondary | ICD-10-CM

## 2019-12-23 MED ORDER — SODIUM CHLORIDE 0.9 % IV SOLN
Freq: Once | INTRAVENOUS | Status: AC
  Filled 2019-12-23: qty 250

## 2019-12-23 MED ORDER — SODIUM CHLORIDE 0.9 % IV SOLN
510.0000 mg | Freq: Once | INTRAVENOUS | Status: AC
  Administered 2019-12-23: 510 mg via INTRAVENOUS
  Filled 2019-12-23: qty 17

## 2019-12-23 NOTE — Progress Notes (Signed)
Remote pacemaker transmission.   

## 2019-12-23 NOTE — Patient Instructions (Signed)

## 2019-12-26 ENCOUNTER — Ambulatory Visit (HOSPITAL_COMMUNITY)
Admission: RE | Admit: 2019-12-26 | Discharge: 2019-12-26 | Disposition: A | Discharge status: Home / Self Care | Payer: Medicare Other | Source: Ambulatory Visit | Attending: Chiropractic Medicine | Admitting: Chiropractic Medicine

## 2019-12-26 ENCOUNTER — Other Ambulatory Visit: Payer: Self-pay

## 2019-12-26 DIAGNOSIS — M25561 Pain in right knee: Secondary | ICD-10-CM | POA: Diagnosis not present

## 2019-12-26 DIAGNOSIS — M545 Low back pain, unspecified: Secondary | ICD-10-CM | POA: Diagnosis not present

## 2019-12-26 DIAGNOSIS — M5459 Other low back pain: Secondary | ICD-10-CM | POA: Insufficient documentation

## 2019-12-26 NOTE — Progress Notes (Signed)
Informed of MRI for today.   Device system confirmed to be MRI conditional, with implant date > 6 weeks ago and no evidence of abandoned or epicardial leads in review of most recent CXR . Pt has RA lead port plug, confirmed with [employer] and Dr. Lovena Le is MRI safe.  Interrogation from today reviewed, pt is currently VP (left bundle) at 80 bpm Change device settings for MRI to VOO at 80 bpm  Tachy-therapies to off if applicable  Program device back to pre-MRI settings after completion of exam.  Shirley Friar, PA-C  12/26/2019 1:30 PM

## 2019-12-26 NOTE — Progress Notes (Signed)
Per order, Set patient's pacemaker to MRI safe mode to VOO 80. After MRI will return back to regular settings and send transmission.

## 2019-12-30 ENCOUNTER — Other Ambulatory Visit: Payer: Self-pay

## 2019-12-30 ENCOUNTER — Other Ambulatory Visit: Payer: Self-pay | Admitting: Physical Medicine and Rehabilitation

## 2019-12-30 ENCOUNTER — Other Ambulatory Visit: Payer: Self-pay | Admitting: Oncology

## 2019-12-30 ENCOUNTER — Inpatient Hospital Stay: Payer: Medicare Other

## 2019-12-30 VITALS — BP 117/84 | HR 73 | Temp 97.6°F | Resp 18

## 2019-12-30 DIAGNOSIS — D508 Other iron deficiency anemias: Secondary | ICD-10-CM

## 2019-12-30 DIAGNOSIS — R188 Other ascites: Secondary | ICD-10-CM

## 2019-12-30 MED ORDER — SODIUM CHLORIDE 0.9 % IV SOLN
510.0000 mg | Freq: Once | INTRAVENOUS | Status: DC
  Filled 2019-12-30: qty 17

## 2019-12-30 NOTE — Progress Notes (Signed)
Pt stuck X3 for PIV with no success. IV team consulted and placed 24G in right hand, infiltrated when this RN tried to flush with saline. PIV removed. Pt will reschedule appt. MD Magrinat's office aware.

## 2019-12-30 NOTE — Patient Instructions (Signed)

## 2019-12-31 ENCOUNTER — Inpatient Hospital Stay: Payer: Medicare Other

## 2019-12-31 ENCOUNTER — Other Ambulatory Visit: Payer: Self-pay

## 2019-12-31 VITALS — BP 112/76 | HR 64 | Temp 98.2°F | Resp 18

## 2019-12-31 DIAGNOSIS — D508 Other iron deficiency anemias: Secondary | ICD-10-CM | POA: Diagnosis not present

## 2019-12-31 DIAGNOSIS — M545 Low back pain, unspecified: Secondary | ICD-10-CM | POA: Diagnosis not present

## 2019-12-31 DIAGNOSIS — R198 Other specified symptoms and signs involving the digestive system and abdomen: Secondary | ICD-10-CM | POA: Diagnosis not present

## 2019-12-31 MED ORDER — SODIUM CHLORIDE 0.9 % IV SOLN
Freq: Once | INTRAVENOUS | Status: AC
  Filled 2019-12-31: qty 250

## 2019-12-31 MED ORDER — SODIUM CHLORIDE 0.9 % IV SOLN
510.0000 mg | Freq: Once | INTRAVENOUS | Status: AC
  Administered 2019-12-31: 510 mg via INTRAVENOUS
  Filled 2019-12-31: qty 510

## 2019-12-31 NOTE — Addendum Note (Signed)
Addended by: Teodoro Spray on: 12/31/2019 09:18 AM   Modules accepted: Orders

## 2019-12-31 NOTE — Patient Instructions (Signed)

## 2019-12-31 NOTE — Progress Notes (Signed)
Patient declined to stay for 30 min post observation. VSS upon leaving infusion room.

## 2020-01-02 DIAGNOSIS — D519 Vitamin B12 deficiency anemia, unspecified: Secondary | ICD-10-CM | POA: Diagnosis not present

## 2020-01-05 ENCOUNTER — Ambulatory Visit: Payer: Medicare Other | Admitting: Neurology

## 2020-01-07 ENCOUNTER — Other Ambulatory Visit: Payer: Self-pay

## 2020-01-07 ENCOUNTER — Other Ambulatory Visit: Payer: Self-pay | Admitting: Physical Medicine and Rehabilitation

## 2020-01-07 ENCOUNTER — Encounter: Payer: Self-pay | Admitting: Internal Medicine

## 2020-01-07 ENCOUNTER — Ambulatory Visit
Admission: RE | Admit: 2020-01-07 | Discharge: 2020-01-07 | Disposition: A | Discharge status: Home / Self Care | Payer: Medicare Other | Source: Ambulatory Visit | Attending: Physical Medicine and Rehabilitation | Admitting: Physical Medicine and Rehabilitation

## 2020-01-07 DIAGNOSIS — R634 Abnormal weight loss: Secondary | ICD-10-CM | POA: Diagnosis not present

## 2020-01-07 DIAGNOSIS — R188 Other ascites: Secondary | ICD-10-CM

## 2020-01-07 DIAGNOSIS — N1330 Unspecified hydronephrosis: Secondary | ICD-10-CM | POA: Diagnosis not present

## 2020-01-07 DIAGNOSIS — D259 Leiomyoma of uterus, unspecified: Secondary | ICD-10-CM | POA: Diagnosis not present

## 2020-01-07 MED ORDER — IOPAMIDOL (ISOVUE-300) INJECTION 61%: 100.0000 mL | Freq: Once | INTRAVENOUS | Status: DC | PRN

## 2020-01-08 ENCOUNTER — Encounter: Payer: Self-pay | Admitting: Internal Medicine

## 2020-01-08 ENCOUNTER — Ambulatory Visit (INDEPENDENT_AMBULATORY_CARE_PROVIDER_SITE_OTHER): Payer: Medicare Other | Admitting: Internal Medicine

## 2020-01-08 DIAGNOSIS — C73 Malignant neoplasm of thyroid gland: Secondary | ICD-10-CM

## 2020-01-08 DIAGNOSIS — M810 Age-related osteoporosis without current pathological fracture: Secondary | ICD-10-CM

## 2020-01-08 DIAGNOSIS — M545 Low back pain, unspecified: Secondary | ICD-10-CM | POA: Diagnosis not present

## 2020-01-08 DIAGNOSIS — E890 Postprocedural hypothyroidism: Secondary | ICD-10-CM | POA: Diagnosis not present

## 2020-01-08 DIAGNOSIS — E559 Vitamin D deficiency, unspecified: Secondary | ICD-10-CM | POA: Diagnosis not present

## 2020-01-08 LAB — T4, FREE: Free T4: 1.11 ng/dL (ref 0.60–1.60)

## 2020-01-08 LAB — TSH: TSH: 1.7 u[IU]/mL (ref 0.35–4.50)

## 2020-01-08 NOTE — Progress Notes (Signed)
Patient ID: Jillian Eaton, female   DOB: February 17, 1951, 69 y.o.   MRN: [id number]   This visit occurred during the SARS-CoV-2 public health emergency.  Safety protocols were in place, including screening questions prior to the visit, additional usage of staff PPE, and extensive cleaning of exam room while observing appropriate contact time as indicated for disinfecting solutions.   HPI  Jillian Eaton is a 69 y.o.-year-old female, initially referred by her PCP, Dr. Sabas Sous, returning for h/o thyroid microcarcinoma,  postsurgical hypothyroidism, postsurgical hypocalcemia, osteoporosis. Last visit 6 months ago.  At this visit, she is concerned about the recent 20 pound weight loss in the last month.  She is not feeling poorly, but does have back pain -abdominal MRI scan from earlier this month showed a large pericolic fluid collection.  She had a CT scan yesterday and this did not show any intra-abdominal pathology to account for this collection.  She has a history of kidney stones and sees urology.  She had cystoscopy with stent placement 07/2019.  Per review of the latest CT scan report from yesterday, she had a 3 mm kidney stone in the right kidney upper pole -this is nonobstructing.  She also continues to see Dr. Jana Hakim for her anemia.  She feels much better after she started [facility] in August.  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 Fredirick Maudlin at [facility].  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  She developed postsurgical hypothyroidism and, unfortunately, also postsurgical hypocalcemia, both uncontrolled.  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 Benign  hyperplastic thyroid tissue. See comment. COMMENT: The papillary carcinoma appears to be incidental.  On 06/19/2017 she developed atrial fibrillation.  At that time, her TSH is very suppressed, at 0.07.  She was previously on 300 mcg levothyroxine daily, gradually decreased-currently on 137 mcg daily (dose increased 06/2019): - in am - fasting - at least 30 min from b'fast - + calcium at night - no iron - no multivitamins - no PPIs - not on Biotin  Reviewed her TFTs: Lab Results  Component Value Date   TSH 0.53 08/20/2019   TSH 0.13 (L) 07/08/2019   TSH 7.71 (H) 06/13/2019   TSH 0.05 (L) 01/06/2019   TSH 1.21 11/27/2017   TSH 0.034 (L) 09/21/2017   TSH 1.48 08/03/2017   TSH 0.007 (L) 06/19/2017   TSH 0.046 (L) 08/29/2016   TSH 9.239 (H) 04/25/2016   FREET4 1.11 08/20/2019   FREET4 1.58 07/08/2019   FREET4 1.47 01/06/2019   FREET4 1.41 11/27/2017   FREET4 2.87 (H) 09/21/2017   FREET4 1.04 08/03/2017   FREET4 2.43 (H) 06/19/2017   FREET4 1.04 08/29/2016   T3FREE 1.0 (L) 06/14/2019  05/08/2018: TSH 2.87  Pt denies: - feeling nodules in neck - hoarseness - choking - SOB with lying down But she has chronic dysphagia  No FH of thyroid cancer. No h/o radiation tx to head or neck.  No herbal supplements. No Biotin use. No recent steroids use.    Hypocalcemia:  She developed iatrogenic hypocalcemia after her thyroid surgery.  Her PTH was inappropriately normal, possibly related to her CKD.  She was admitted with hypocalcemia and acute respiratory failure on 07/10/2017.  She again had hypocalcemia at her recent admission in 04/2019 along with  SOB, fatigue.  Reviewed pertinent labs: 08/20/2019: Ionized calcium 4.09 (4.8-5.6) Lab Results  Component Value Date   PTH 40 11/27/2017   PTH 44 08/03/2017   PTH 65 07/10/2017   PTH Comment 07/10/2017   CALCIUM 7.9 (L) 08/07/2019   CALCIUM 8.2 (L) 06/21/2019   CALCIUM 8.1 (L) 06/19/2019   CALCIUM 8.4 (L) 06/18/2019    CALCIUM 8.3 (L) 06/17/2019   CALCIUM 8.5 (L) 06/16/2019   CALCIUM 8.1 (L) 06/15/2019   CALCIUM 8.3 (L) 06/14/2019   CALCIUM 8.3 (L) 06/13/2019   CALCIUM 8.3 (L) 06/05/2019  10/09/2017: Ca 8.1 (8.7-10.3)  She has a history of low magnesium and high phosphorus.  Magnesium was normal at last check: Lab Results  Component Value Date   MG 2.0 06/19/2019   MG 2.1 06/18/2019   MG 2.2 06/17/2019   MG 1.8 06/16/2019   MG 1.7 06/05/2019   MG 1.4 (L) 12/07/2018   MG 1.6 11/27/2017   MG 1.5 08/03/2017   MG 2.1 07/15/2017   MG 1.6 (L) 07/14/2017   MG 1.3 (L) 07/13/2017   MG 1.2 (L) 07/10/2017   MG 1.8 07/11/2016   MG 2.2 07/09/2016   MG 1.6 (L) 07/09/2016   MG 1.9 07/08/2016   MG 2.0 07/08/2016   MG 1.6 (L) 07/08/2016   MG 1.6 (L) 05/03/2016   MG 1.8 04/25/2016  10/09/2017: Mg 2.0 (1.6-2.3)  Lab Results  Component Value Date   PHOS 3.2 06/16/2019   PHOS 3.8 11/27/2017   PHOS 6.3 (H) 08/03/2017   PHOS 7.4 (H) 07/10/2017   PHOS 3.6 07/11/2016   PHOS 4.0 07/09/2016   PHOS 3.5 07/09/2016   PHOS 5.2 (H) 07/08/2016   PHOS 6.6 (H) 07/08/2016   PHOS 8.0 (H) 07/08/2016   PHOS 3.6 04/25/2016   PHOS 3.5 04/25/2016   PHOS 3.3 04/24/2016   PHOS 3.7 03/12/2016   PHOS 6.2 (H) 03/10/2016  10/09/2017: phos 4.5 (2.5-4.5)  05/08/2018: -CMP: Glucose 29 (!),  BUN/creatinine 19/0.94, GFR 74, calcium 8.3 (8.7-10.3), Albumin 3.6 (3.8-4.8) -Vitamin D 39.8 -TSH 2.87 -Phosphorus 3.6 (3-4.3) -Magnesium 1.6 (1.6-2.3)  -Ferritin 29 (15-150), hemoglobin 11.1 (11.1-15.9) Corrected calcium: 8.62, slightly lower than normal, which is our target. I was surprised by her very low glucose (possibly lab artifact as she did not have any symptoms at the time of the blood draw).  She does not have a history of diabetes but has steroid-induced hyperglycemia...   She is on the following regimen: -  >> she stopped taking it in 08/2018 (!!) >> restarted 12/2018: Now takes 2000 mg with D - high-dose vitamin D  50,000 units weekly - calcitriol 0.5 mcg 2x a day (she did not decrease to 0.25 mcg 2x a day as advised at last visit, and she forgot) >> now 0.25 mcg daily  We also started HCTZ 25 mg daily.  In the meantime, we reduced her Lasix to 50% of her previous dose.  Now taking it as needed.  She did not take it since 09/2019.  No hand cramping or perioral numbness.  History of vitamin D deficiency.    Vitamin D level was reviewed and this was actually high at last check while in the hospital: Lab Results  Component Value Date   VD25OH 65.6 07/08/2019   VD25OH 107.02 (H) 06/16/2019   VD25OH 63.90 01/06/2019   VD25OH 45.84 11/27/2017   VD25OH 43.9 07/14/2017  05/08/2018: Vitamin D 39.8  Calcitriol level was not elevated Component     Latest Ref  Rng & Units 11/27/2017  Vitamin D 1, 25 (OH) Total     18 - 72 pg/mL 54  Vitamin D3 1, 25 (OH)     pg/mL 30  Vitamin D2 1, 25 (OH)     pg/mL 24   Osteoporosis:  Reviewed previous DEXA scan report: DXA  - [facility] (12/10/2017) Lumbar spine L1-L4 (L2) Femoral neck (FN) 33% distal left radius Ultra distal left radius  T-score -1.6 RFN: N/a LFN: -2.8  -2.3  -3.5   Assessment: Patient has OSTEOPOROSIS according to the [facility] [employer] classification for osteoporosis (see below). Fracture risk: high Comments: the technical quality of the study is good, however, the right femoral neck could not be analyzed as the patient could not internally rotate the right hip.  The left radius was analyzed instead.  Also, L2 vertebra had to be excluded from analysis due to degenerative changes.   We started Prolia: 01/03/2018 07/16/2018 01/21/2019 08/05/2019  She tolerates this well.  No jaw, hip, thigh pain.  + CKD. Last BUN/Cr: Lab Results  Component Value Date   BUN 20 08/07/2019   CREATININE 1.05 (H) 08/07/2019   She surgery for torn rotator cuffs in both shoulders. She has atrial fibrillation and is status post cardioversion. She was on amiodarone before, now  off. She was admitted 07/2018 with SBO and 11/2018 with acute on chronic respiratory failure and pulmonary hypertension. She had right TKR 02/18/2019.  She had left TKR 11/14/2018. She ahad a seizure 05/2018.  She was suspected for stroke, but this was ruled out. Of note, upon questioning, she stopped Lyrica 1 week prior to the seizure.  She was taking this for fibromyalgia.  She was started on Keppra.  ROS: Constitutional: no weight gain/no weight loss, + fatigue, no subjective hyperthermia, no subjective hypothermia Eyes: no blurry vision, no xerophthalmia ENT: no sore throat, + see HPI Cardiovascular: no CP/+ SOB/+ palpitations/no leg swelling Respiratory: no cough/+ SOB/no wheezing Gastrointestinal: no N/no V/no D/no C/no acid reflux, + AP Musculoskeletal: + muscle aches/+ joint aches Skin: no rashes, no hair loss Neurological: no tremors/no numbness/no tingling/no dizziness    I reviewed pt's medications, allergies, PMH, social hx, family hx, and changes were documented in the history of present illness. Otherwise, unchanged from my initial visit note.   Past Medical History:  Diagnosis Date  . Anemia    iron and pernicious  . Arthritis   . CHF (congestive heart failure) ([place]) 2018  . Complication of anesthesia    BP dropped   . DOE (dyspnea on exertion)    2D ECHO, 02/12/2012 - EF 60-65%, moderate concentric hypertrophy  . Dyspnea   . Dysrhythmia    a-fib  . Fibromyalgia    nerve pain"left side at waist level" "can't lay on that side without pain" , "HOB elevation helps"; pt. thinks this has resolved after 200lb weight loss9-17- since i lost the wieght , i dont  think i have this anymore   . Headache 04/2019  . Heart murmur   . Hematuria - cause not known    resolved   . History of kidney stones    x 2 '13, '14 surgery to remove  . Pneumonia    aspiration  04/2016  . Presence of permanent cardiac pacemaker   . SBO (small bowel obstruction) ([place])    rqueired admission  2020  . Thyroid disease    "goiter"  . Transfusion history    10 yrs+   Past Surgical History:  Procedure Laterality Date  .  ABDOMINAL ADHESION SURGERY  2004   open LOA - in [place]  . AV NODE ABLATION N/A 06/20/2019   Procedure: AV NODE ABLATION;  Surgeon: Evans Lance, MD;  Location: [facility];  Service: Cardiovascular;  Laterality: N/A;  . BALLOON DILATION N/A 07/25/2019   Procedure: BALLOON DILATION;  Surgeon: Carol Ada, MD;  Location: [facility];  Service: Endoscopy;  Laterality: N/A;  . CARDIAC CATHETERIZATION  04/04/2010   No significant obstructive coronary artery disease  . CARDIOVERSION N/A 08/08/2017   Procedure: CARDIOVERSION;  Surgeon: Pixie Casino, MD;  Location: [facility];  Service: Cardiovascular;  Laterality: N/A;  . CARDIOVERSION N/A 06/18/2019   Procedure: CARDIOVERSION;  Surgeon: Pixie Casino, MD;  Location: [facility];  Service: Cardiovascular;  Laterality: N/A;  . CHOLECYSTECTOMY  1990  . COLONOSCOPY W/ POLYPECTOMY    . COLONOSCOPY WITH PROPOFOL N/A 04/10/2014   Procedure: COLONOSCOPY WITH PROPOFOL;  Surgeon: Beryle Beams, MD;  Location: [facility];  Service: Endoscopy;  Laterality: N/A;  . CYSTOSCOPY/URETEROSCOPY/HOLMIUM LASER/STENT PLACEMENT Right 08/13/2019   Procedure: CYSTOSCOPY/RETROGRADE/URETEROSCOPY/HOLMIUM LASER/STENT PLACEMENT;  Surgeon: Ceasar Mons, MD;  Location: [facility];  Service: Urology;  Laterality: Right;  ONLY NEEDS 45 MIN  . ESOPHAGOGASTRODUODENOSCOPY (EGD) WITH PROPOFOL N/A 07/25/2019   Procedure: ESOPHAGOGASTRODUODENOSCOPY (EGD) WITH PROPOFOL;  Surgeon: Carol Ada, MD;  [facility];  Service: Endoscopy;  Laterality: N/A;  . EYE SURGERY     lasik 20-25 yrs ago  . GASTROPLASTY VERTICAL BANDED  1985  . [place]   In [place] for SBO  . MALONEY DILATION  07/25/2019   Procedure: Venia Minks DILATION;  Surgeon: Carol Ada, MD;  Location: [facility];  Service:  Endoscopy;;  . PACEMAKER IMPLANT N/A 06/20/2019   Procedure: PACEMAKER IMPLANT;  Surgeon: Evans Lance, MD;  Location: [facility];  Service: Cardiovascular;  Laterality: N/A;  . REVERSE SHOULDER ARTHROPLASTY Right 03/29/2018   Procedure: REVERSE SHOULDER ARTHROPLASTY;  Surgeon: Netta Cedars, MD;  Location: [facility];  Service: Orthopedics;  Laterality: Right;  . ROUX-EN-Y GASTRIC BYPASS  1992   Conversion VBG to RnYGB in [place], [place]  . SHOULDER ARTHROSCOPY WITH SUBACROMIAL DECOMPRESSION  2016   Dr Berenice Primas, [place], [place]  . SMALL INTESTINE SURGERY  2000   SBO - LOA w SB resection  . TOTAL KNEE ARTHROPLASTY Left 11/14/2018   Procedure: TOTAL KNEE ARTHROPLASTY;  Surgeon: Paralee Cancel, MD;  Location[facility];  Service: Orthopedics;  Laterality: Left;  70 mins  . TOTAL KNEE ARTHROPLASTY Right 04/29/2019   Procedure: TOTAL KNEE ARTHROPLASTY;  Surgeon: Paralee Cancel, MD;  Location: [facility];  Service: Orthopedics;  Laterality: Right;  70 mins  . TOTAL THYROIDECTOMY  06/26/2012   Dr Celine Ahr, [facility]  . TUBAL LIGATION  1986  . URETHRAL DILATION  2012   w cystoscopy.  Dr Gaynelle Arabian  . UTERINE FIBROID EMBOLIZATION     Social History   Socioeconomic History  . Marital status: Married    Spouse name: Not on file  . Number of children: 3  . Years of education: Not on file  . Highest education level: Master's degree (e.g., MA, MS, MEng, MEd, MSW, MBA)  Occupational History  . Occupation: retired  Tobacco Use  . Smoking status: Never Smoker  . Smokeless tobacco: Never Used  Vaping Use  . Vaping Use: Never used  Substance and Sexual Activity  . Alcohol use: No    Comment: wine occ  . Drug use: No  Types: Oxycodone    Comment: perscribed  . Sexual activity: Not Currently    Birth control/protection: None  Other Topics Concern  . Not on file  Social History Narrative   Right handed   1 cup of caffeine per day        Social Determinants of Health   Financial Resource  Strain:   . Difficulty of Paying Living Expenses: Not on file  Food Insecurity:   . Worried About Charity fundraiser in the Last Year: Not on file  . Ran Out of Food in the Last Year: Not on file  Transportation Needs:   . Lack of Transportation (Medical): Not on file  . Lack of Transportation (Non-Medical): Not on file  Physical Activity:   . Days of Exercise per Week: Not on file  . Minutes of Exercise per Session: Not on file  Stress:   . Feeling of Stress : Not on file  Social Connections:   . Frequency of Communication with Friends and Family: Not on file  . Frequency of Social Gatherings with Friends and Family: Not on file  . Attends Religious Services: Not on file  . Active Member of Clubs or Organizations: Not on file  . Attends Archivist Meetings: Not on file  . Marital Status: Not on file  Intimate Partner Violence:   . Fear of Current or Ex-Partner: Not on file  . Emotionally Abused: Not on file  . Physically Abused: Not on file  . Sexually Abused: Not on file   Current Outpatient Medications on File Prior to Visit  Medication Sig Dispense Refill  . albuterol (VENTOLIN HFA) 108 (90 Base) MCG/ACT inhaler Inhale 2 puffs into the lungs every 6 (six) hours as needed for wheezing or shortness of breath.    Marland Kitchen apixaban (ELIQUIS) 5 MG TABS tablet Take 1 tablet (5 mg total) by mouth 2 (two) times daily. Please resume eliquis on 5/5 pm, 14 tablet 0  . calcitRIOL (ROCALTROL) 0.25 MCG capsule Take 1 capsule (0.25 mcg total) by mouth daily. 90 capsule 3  . clotrimazole (LOTRIMIN) 1 % cream APPLY TO AFFECTED AREA TWICE A DAY (Patient taking differently: Apply 1 application topically 2 (two) times daily as needed (affected area(s) of feet). ) 45 g 0  . cyanocobalamin (,VITAMIN B-12,) 1000 MCG/ML injection Inject 1 mL (1,000 mcg total) into the muscle every 30 (thirty) days. 1 mL 0  . cyclobenzaprine (FLEXERIL) 10 MG tablet Take 20 mg by mouth at bedtime.     . DULoxetine  (CYMBALTA) 60 MG capsule Take 1 capsule (60 mg total) by mouth daily. 30 capsule 0  . eszopiclone (LUNESTA) 2 MG TABS tablet Take 1 tablet (2 mg total) by mouth at bedtime. 20 tablet 0  . FOLIC ACID PO Take 1 tablet by mouth daily.    . furosemide (LASIX) 20 MG tablet Take 1 tablet (20 mg total) by mouth as needed. (Patient taking differently: Take 20 mg by mouth daily as needed (fluid retention). ) 90 tablet 2  . levothyroxine (SYNTHROID) 137 MCG tablet TAKE 1 TABLET (137 MCG TOTAL) BY MOUTH DAILY BEFORE BREAKFAST. 90 tablet 1  . magnesium oxide (MAG-OX) 400 (241.3 Mg) MG tablet Take 400 mg by mouth daily.     . montelukast (SINGULAIR) 10 MG tablet Take 1 tablet (10 mg total) by mouth at bedtime. 30 tablet 5  . NONFORMULARY OR COMPOUNDED ITEM [facility]:  Antifungal Cream - Terbinafine 3%, Fluconazole 2%, Tea Tree Oil 5%,  Urea 10%, Ibuprofen 2% in DMSO Suspension #73m. Apply to affected toenail(s) once daily (at bedtime) or twice daily. (Patient taking differently: Apply 1 application topically 2 (two) times daily as needed (affected area(s) of feet.). [facility]:  Antifungal Cream - Terbinafine 3%, Fluconazole 2%, Tea Tree Oil 5%, Urea 10%, Ibuprofen 2% in DMSO Suspension #330m Apply to affected toenail(s) once daily (at bedtime) or twice daily.) 30 each 11  . traZODone (DESYREL) 50 MG tablet Take 1 tablet (50 mg total) by mouth at bedtime. 30 tablet 0  . Vitamin D, Ergocalciferol, (DRISDOL) 1.25 MG (50000 UT) CAPS capsule Take 1 capsule (50,000 Units total) by mouth every Wednesday. 4 capsule 0   No current facility-administered medications on file prior to visit.   Allergies  Allergen Reactions  . Hydrocodone Itching        Family History  Problem Relation Age of Onset  . Diabetes Mother   . Epilepsy Mother   . Cancer Mother        Breast  . Hypertension Mother   . Breast cancer Mother   . Seizures Mother   . Kidney disease Father   . Diabetes Father   .  Hypertension Father   . Asthma Father   . Heart disease Father   . Epilepsy Sister   . Cancer Maternal Grandmother   . Breast cancer Maternal Grandmother   . Cancer Paternal Grandmother   . Breast cancer Paternal Grandmother     PE: BP 122/78   Pulse 73   Ht 5' 7"  (1.702 m)   Wt 178 lb 12.8 oz (81.1 kg)   SpO2 99%   BMI 28.00 kg/m  Wt Readings from Last 3 Encounters:  01/08/20 178 lb 12.8 oz (81.1 kg)  12/16/19 198 lb 6.4 oz (90 kg)  10/16/19 192 lb (87.1 kg)   Constitutional: overweight, in NAD Eyes: PERRLA, EOMI, no exophthalmos ENT: moist mucous membranes, no thyromegaly, no cervical lymphadenopathy Cardiovascular: + Irregularly irregular rhythm RR, No MRG, + LE mild periankle edema bilaterally Respiratory: CTA B Gastrointestinal: abdomen soft, NT, ND, BS+ Musculoskeletal: no deformities, strength intact in all 4 Skin: moist, warm, no rashes Neurological: no tremor with outstretched hands, DTR normal in all 4  Assessment: 1. Papillary thyroid cancer  2. Postsurgical hypothyroidism  3.  Postsurgical  Hypocalcemia  4.  Osteoporosis  5.  Vitamin D deficiency  PLAN: 1. PTC  Patient had an incidentally found focus of micropapillary thyroid cancer, which was discovered after her thyroidectomy for enlarging nontoxic multinodular goiter.  Since the cancer was so small, this is most likely cured by her thyroidectomy. -No imaging follow-up is necessary.  We will continue to follow her clinically. -No neck compression symptoms except for chronic dysphagia -At last visit, we checked her ATA antibodies and her thyroglobulin and these were undetectable.  We will repeat them at next visit.  2. Patient with hypothyroidism developed after thyroidectomy - latest thyroid labs reviewed with pt >> normal: Lab Results  Component Value Date   TSH 0.53 08/20/2019   - she continues on LT4 137 mcg daily (dose increased at last visit) - pt feels good on this dose, however, she had a  significant weight loss  of 20 lbs in the last mo!!!  - we discussed about taking the thyroid hormone every day, with water, >30 minutes before breakfast, separated by >4 hours from acid reflux medications, calcium, iron, multivitamins. Pt. is taking it correctly. - will check thyroid tests today: TSH and fT4 -  If labs are abnormal, she will need to return for repeat TFTs in 1.5 months  3. Patient with postoperative, iatrogenic hypocalcemia -In 2019, she had a very low blood calcium of 5.5.  Vitamin D level was normal.  She does have a history of magnesium deficiency and high phosphorus.  Restarted calcitriol and her calcium levels improved and her magnesium and phosphorus normalized. Desirable calcium levels are at the lower limit of normal or slightly higher -At the end of last year she was off calcium supplements for 4 months.  An ionized calcium was normal, 4.9 (4.8-5.6).  At last visit, however, I recommended to a calcium tablet with dinner. -She denies signs or symptoms of hypocalcemia: No perioral numbness, no cramping -Most recent vitamin D level was normal 6 months ago -At last visit, I recommended to decrease her calcitriol from 0.5 to 0.25 mcg twice a day and to add an extra calcium tablet with dinner.  At this visit, she is taking the calcitriol 0.25 mg once a day and she takes 2000 mg of calcium with dinner. -Currently, Natpara was retracted from the market after being due to presence of contaminants -I will see her back in 6 months  4. Osteoporosis -Latest DXA scan report showed osteoporosis -We started Prolia in 12/2017 -She is tolerating Prolia well, without jaw, thigh, hip pain -last dose: 08/05/2019 -had 4 doses so far and is due for another dose next month -She continues to have lower calcium levels due to her postsurgical relative hypoparathyroidism -We will continue Prolia for at least 6 years but we can prolong this to 10 years -She is due for another bone density scan.   Stable or increasing  T-scores are desirable, but ultimately, the best indication that the treatment is working is her not having fractures. -We will order new bone density scan now  5.  Vitamin D deficiency -Latest vitamin D level was reviewed and this was normal at last check 06/2019 -Continues high-dose ergocalciferol 50,000 units weekly -will recheck the level now  - Total time spent for the visit: 40 minutes, in obtaining medical information from the chart and from the patient, reviewing her  previous labs, imaging evaluations, and treatments, reviewing her symptoms, counseling her about her conditions (please see the discussed topics above), and developing a plan to further investigate and treat them; she had a number of questions which I addressed.  She sent me a message after the visit clarifying her calcium intake: Each tablet is 1051m. I take two with evening meal.  Component     Latest Ref Rng & Units 01/08/2020  TSH     0.35 - 4.50 uIU/mL 1.70  T4,Free(Direct)     0.60 - 1.60 ng/dL 1.11  Vitamin D, 25-Hydroxy     30.0 - 100.0 ng/mL 58.6  Calcium Ionized     4.8 - 5.6 mg/dL 4.7 (L)   TFTs are normal.  Vitamin D level is excellent.  Her ionized calcium has improved and is now only slightly under the lower limit of normal, which is actually at target.  We will continue the current regimen.   CPhilemon Kingdom MD PhD [facility] Institute[facility]in[facility]

## 2020-01-08 NOTE — Patient Instructions (Addendum)
Please continue: - Vitamin D 50,000 U weekly - Calcitriol 0.25 mcg 1x a day - calcium 1000 mg at dinnertime  Also, continue levothyroxine 137 mcg daily.  Take the thyroid hormone every day, with water, at least 30 minutes before breakfast, separated by at least 4 hours from: - acid reflux medications - calcium - iron - multivitamins  Please stop at the lab.  Please call and schedule bone density scan at the [facility]: [phone].  Please come back for a follow-up appointment in 6 months.

## 2020-01-09 ENCOUNTER — Ambulatory Visit (INDEPENDENT_AMBULATORY_CARE_PROVIDER_SITE_OTHER)
Admission: RE | Admit: 2020-01-09 | Discharge: 2020-01-09 | Disposition: A | Discharge status: Home / Self Care | Payer: Medicare Other | Source: Ambulatory Visit | Attending: Internal Medicine | Admitting: Internal Medicine

## 2020-01-09 ENCOUNTER — Other Ambulatory Visit: Payer: Self-pay

## 2020-01-09 ENCOUNTER — Encounter: Payer: Self-pay | Admitting: Internal Medicine

## 2020-01-09 DIAGNOSIS — M810 Age-related osteoporosis without current pathological fracture: Secondary | ICD-10-CM | POA: Diagnosis not present

## 2020-01-09 LAB — CALCIUM, IONIZED: Calcium, Ion: 4.7 mg/dL — ABNORMAL LOW (ref 4.8–5.6)

## 2020-01-09 LAB — VITAMIN D 25 HYDROXY (VIT D DEFICIENCY, FRACTURES): Vit D, 25-Hydroxy: 58.6 ng/mL (ref 30.0–100.0)

## 2020-01-12 ENCOUNTER — Other Ambulatory Visit: Payer: Medicare Other

## 2020-01-13 ENCOUNTER — Other Ambulatory Visit: Payer: Self-pay | Admitting: Physical Medicine and Rehabilitation

## 2020-01-14 ENCOUNTER — Other Ambulatory Visit: Payer: Medicare Other

## 2020-01-15 ENCOUNTER — Encounter: Payer: Self-pay | Admitting: Internal Medicine

## 2020-01-19 ENCOUNTER — Other Ambulatory Visit: Payer: Self-pay | Admitting: Internal Medicine

## 2020-01-19 ENCOUNTER — Other Ambulatory Visit (HOSPITAL_COMMUNITY): Payer: Self-pay | Admitting: Internal Medicine

## 2020-01-19 DIAGNOSIS — E890 Postprocedural hypothyroidism: Secondary | ICD-10-CM

## 2020-01-19 DIAGNOSIS — R198 Other specified symptoms and signs involving the digestive system and abdomen: Secondary | ICD-10-CM

## 2020-01-23 DIAGNOSIS — Z96653 Presence of artificial knee joint, bilateral: Secondary | ICD-10-CM | POA: Diagnosis not present

## 2020-01-23 DIAGNOSIS — Z471 Aftercare following joint replacement surgery: Secondary | ICD-10-CM | POA: Diagnosis not present

## 2020-01-28 ENCOUNTER — Ambulatory Visit (HOSPITAL_COMMUNITY)
Admission: RE | Admit: 2020-01-28 | Discharge: 2020-01-28 | Disposition: A | Discharge status: Home / Self Care | Payer: Medicare Other | Source: Ambulatory Visit | Attending: Internal Medicine | Admitting: Internal Medicine

## 2020-01-28 ENCOUNTER — Other Ambulatory Visit: Payer: Self-pay

## 2020-01-28 DIAGNOSIS — R601 Generalized edema: Secondary | ICD-10-CM | POA: Diagnosis not present

## 2020-01-28 DIAGNOSIS — N200 Calculus of kidney: Secondary | ICD-10-CM | POA: Diagnosis not present

## 2020-01-28 DIAGNOSIS — K6389 Other specified diseases of intestine: Secondary | ICD-10-CM | POA: Diagnosis not present

## 2020-01-28 DIAGNOSIS — D259 Leiomyoma of uterus, unspecified: Secondary | ICD-10-CM | POA: Diagnosis not present

## 2020-01-28 DIAGNOSIS — R198 Other specified symptoms and signs involving the digestive system and abdomen: Secondary | ICD-10-CM | POA: Diagnosis not present

## 2020-01-28 LAB — POCT I-STAT CREATININE: Creatinine, Ser: 1 mg/dL (ref 0.44–1.00)

## 2020-01-28 MED ORDER — IOHEXOL 300 MG/ML  SOLN
100.0000 mL | Freq: Once | INTRAMUSCULAR | Status: AC | PRN
  Administered 2020-01-28: 100 mL via INTRAVENOUS

## 2020-01-28 MED ORDER — SODIUM CHLORIDE (PF) 0.9 % IJ SOLN
INTRAMUSCULAR | Status: AC
  Filled 2020-01-28: qty 50

## 2020-01-29 ENCOUNTER — Telehealth: Payer: Self-pay

## 2020-01-29 NOTE — Telephone Encounter (Signed)
The pt states she will take the phone screening off so she can receive the call. She sent the transmission. I told her as soon as we receive it the nurse will give her a call back.

## 2020-01-29 NOTE — Telephone Encounter (Signed)
Returning patients phone call. States she has felt fatigued/ shortness of breath with exertion for approx. 3-4 weeks, although has gradually worsened overtime. Patient was seen in-clinic and programmed rate repsonseR 09/24/19, states this helped greatly with energy. Reports lately she has required iron infusion over the fast few months. Advised patient I will forward to Dr. Lovena Le to see if he thinks changes are necessary. Histograms appear stable trends. I did advise patient to follow up with PCP in regards to concern to recent need of iron infusions to r/o any other issues. States she will call PCP today. Patient agreeable to plan. Advised patient to call with any changes or concerns.   Manual transmission received 01/29/20. Normal device function. Presenting VP 76. 1 NSVT, total of 10 beats. No other events. Histograms appear appropriate.  Routing to Dr. Lovena Le for review and recommendations.

## 2020-01-29 NOTE — Telephone Encounter (Signed)
The pt left a message stating she has been feeling fatigue and tired when she active. She thinks she may needs some adjustments. Her phone number is [phone].   I left a message on the pt answering machine to send a manual transmission with her app so the nurse can review it. I left the device clinic direct number for her to call back.

## 2020-02-02 ENCOUNTER — Other Ambulatory Visit: Payer: Self-pay

## 2020-02-02 ENCOUNTER — Ambulatory Visit
Admission: RE | Admit: 2020-02-02 | Discharge: 2020-02-02 | Disposition: A | Discharge status: Home / Self Care | Payer: Medicare Other | Source: Ambulatory Visit | Attending: Internal Medicine | Admitting: Internal Medicine

## 2020-02-02 DIAGNOSIS — Z1231 Encounter for screening mammogram for malignant neoplasm of breast: Secondary | ICD-10-CM

## 2020-02-03 DIAGNOSIS — Z9884 Bariatric surgery status: Secondary | ICD-10-CM | POA: Diagnosis not present

## 2020-02-03 DIAGNOSIS — E559 Vitamin D deficiency, unspecified: Secondary | ICD-10-CM | POA: Diagnosis not present

## 2020-02-03 DIAGNOSIS — E538 Deficiency of other specified B group vitamins: Secondary | ICD-10-CM | POA: Diagnosis not present

## 2020-02-03 DIAGNOSIS — D519 Vitamin B12 deficiency anemia, unspecified: Secondary | ICD-10-CM | POA: Diagnosis not present

## 2020-02-03 DIAGNOSIS — D509 Iron deficiency anemia, unspecified: Secondary | ICD-10-CM | POA: Diagnosis not present

## 2020-02-04 ENCOUNTER — Other Ambulatory Visit: Payer: Self-pay

## 2020-02-04 ENCOUNTER — Ambulatory Visit (INDEPENDENT_AMBULATORY_CARE_PROVIDER_SITE_OTHER): Payer: Medicare Other

## 2020-02-04 DIAGNOSIS — M810 Age-related osteoporosis without current pathological fracture: Secondary | ICD-10-CM

## 2020-02-04 MED ORDER — DENOSUMAB 60 MG/ML ~~LOC~~ SOSY
60.0000 mg | PREFILLED_SYRINGE | Freq: Once | SUBCUTANEOUS | Status: AC
  Administered 2020-02-04: 60 mg via SUBCUTANEOUS

## 2020-02-04 NOTE — Progress Notes (Signed)
Prolia injection administered. Patient tolerated well.  

## 2020-02-05 ENCOUNTER — Ambulatory Visit: Payer: Medicare Other

## 2020-02-09 DIAGNOSIS — R634 Abnormal weight loss: Secondary | ICD-10-CM | POA: Diagnosis not present

## 2020-02-09 DIAGNOSIS — Z9884 Bariatric surgery status: Secondary | ICD-10-CM | POA: Diagnosis not present

## 2020-02-09 DIAGNOSIS — D509 Iron deficiency anemia, unspecified: Secondary | ICD-10-CM | POA: Diagnosis not present

## 2020-02-09 DIAGNOSIS — Z1211 Encounter for screening for malignant neoplasm of colon: Secondary | ICD-10-CM | POA: Diagnosis not present

## 2020-02-09 DIAGNOSIS — I959 Hypotension, unspecified: Secondary | ICD-10-CM | POA: Diagnosis not present

## 2020-02-10 DIAGNOSIS — M47816 Spondylosis without myelopathy or radiculopathy, lumbar region: Secondary | ICD-10-CM | POA: Diagnosis not present

## 2020-02-13 DIAGNOSIS — Z20822 Contact with and (suspected) exposure to covid-19: Secondary | ICD-10-CM | POA: Diagnosis not present

## 2020-02-18 DIAGNOSIS — S2020XA Contusion of thorax, unspecified, initial encounter: Secondary | ICD-10-CM | POA: Diagnosis not present

## 2020-02-25 DIAGNOSIS — Z20822 Contact with and (suspected) exposure to covid-19: Secondary | ICD-10-CM | POA: Diagnosis not present

## 2020-02-27 DIAGNOSIS — Z20822 Contact with and (suspected) exposure to covid-19: Secondary | ICD-10-CM | POA: Diagnosis not present

## 2020-03-05 ENCOUNTER — Other Ambulatory Visit: Payer: Self-pay | Admitting: Pulmonary Disease

## 2020-03-08 DIAGNOSIS — R42 Dizziness and giddiness: Secondary | ICD-10-CM | POA: Diagnosis not present

## 2020-03-10 DIAGNOSIS — M47816 Spondylosis without myelopathy or radiculopathy, lumbar region: Secondary | ICD-10-CM | POA: Diagnosis not present

## 2020-03-15 ENCOUNTER — Telehealth: Payer: Self-pay

## 2020-03-15 ENCOUNTER — Other Ambulatory Visit: Payer: Self-pay | Admitting: Gastroenterology

## 2020-03-15 DIAGNOSIS — R630 Anorexia: Secondary | ICD-10-CM | POA: Diagnosis not present

## 2020-03-15 DIAGNOSIS — R634 Abnormal weight loss: Secondary | ICD-10-CM | POA: Diagnosis not present

## 2020-03-15 DIAGNOSIS — Z86010 Personal history of colonic polyps: Secondary | ICD-10-CM | POA: Diagnosis not present

## 2020-03-15 NOTE — Telephone Encounter (Signed)
   [facility] Pre-operative Risk Assessment    Request for surgical clearance:  1. What type of surgery is being performed? COLONOSCOPY  2. When is this surgery scheduled? 04-09-2020   3. What type of clearance is required (medical clearance vs. Pharmacy clearance to hold med vs. Both)? Both   4. Are there any medications that need to be held prior to surgery and how long? ELIQUIS    5. Practice name and name of physician performing surgery? [place]  DR HUNG  ATTN: ASHLEY  6. What is the office phone number? [phone]   7.   What is the office fax number? [phone]  8.   Anesthesia type (None, local, MAC, general) ? PROPOFOL

## 2020-03-16 NOTE — Telephone Encounter (Signed)
   Primary Cardiologist: Pixie Casino, MD  Chart reviewed as part of pre-operative protocol coverage. Given past medical history and time since last visit, based on ACC/AHA guidelines, Allycia Gaynelle Scarpino would be at acceptable risk for the planned procedure without further cardiovascular testing.   Patient with diagnosis of afib on Eliquis for anticoagulation.    Procedure: colonoscopy Date of procedure: 04/09/20  CHA2DS2-VASc Score = 4  This indicates a 4.8% annual risk of stroke. The patient's score is based upon: CHF History: Yes HTN History: Yes Diabetes History: No Stroke History: No Vascular Disease History: No Age Score: 1 Gender Score: 1  CrCl 9mL/min Platelet count 209K  Per office protocol, patient can hold Eliquis for 1-2 days prior to procedure.   I will route this recommendation to the requesting party via Epic fax function and remove from pre-op pool.  Please call with questions.  [name]. Abiha Lukehart NP-C    03/16/2020, 9:58 AM [address] Office [phone] Fax [phone]

## 2020-03-16 NOTE — Telephone Encounter (Signed)
Patient with diagnosis of afib on Eliquis for anticoagulation.    Procedure: colonoscopy Date of procedure: 04/09/20  CHA2DS2-VASc Score = 4  This indicates a 4.8% annual risk of stroke. The patient's score is based upon: CHF History: Yes HTN History: Yes Diabetes History: No Stroke History: No Vascular Disease History: No Age Score: 1 Gender Score: 1  CrCl 79mL/min Platelet count 209K  Per office protocol, patient can hold Eliquis for 1-2 days prior to procedure.

## 2020-03-19 ENCOUNTER — Ambulatory Visit (INDEPENDENT_AMBULATORY_CARE_PROVIDER_SITE_OTHER): Payer: Medicare Other

## 2020-03-19 DIAGNOSIS — I442 Atrioventricular block, complete: Secondary | ICD-10-CM | POA: Diagnosis not present

## 2020-03-19 LAB — CUP PACEART REMOTE DEVICE CHECK
Battery Remaining Longevity: 139 mo
Battery Voltage: 3.11 V
Brady Statistic AP VP Percent: 0 %
Brady Statistic AP VS Percent: 0 %
Brady Statistic AS VP Percent: 99.77 %
Brady Statistic AS VS Percent: 0.23 %
Brady Statistic RA Percent Paced: 0 %
Brady Statistic RV Percent Paced: 99.77 %
Date Time Interrogation Session: 20220128112334
Implantable Lead Implant Date: 20210430
Implantable Lead Location: 753860
Implantable Lead Model: 3830
Implantable Pulse Generator Implant Date: 20210430
Lead Channel Impedance Value: 3382 Ohm
Lead Channel Impedance Value: 3382 Ohm
Lead Channel Impedance Value: 380 Ohm
Lead Channel Impedance Value: 551 Ohm
Lead Channel Pacing Threshold Amplitude: 0.625 V
Lead Channel Pacing Threshold Pulse Width: 0.4 ms
Lead Channel Sensing Intrinsic Amplitude: 15.875 mV
Lead Channel Sensing Intrinsic Amplitude: 15.875 mV
Lead Channel Setting Pacing Amplitude: 2.5 V
Lead Channel Setting Pacing Pulse Width: 0.4 ms
Lead Channel Setting Sensing Sensitivity: 4 mV

## 2020-03-22 ENCOUNTER — Other Ambulatory Visit: Payer: Self-pay

## 2020-03-22 ENCOUNTER — Encounter: Payer: Self-pay | Admitting: Podiatry

## 2020-03-22 ENCOUNTER — Ambulatory Visit (INDEPENDENT_AMBULATORY_CARE_PROVIDER_SITE_OTHER): Payer: Medicare Other | Admitting: Podiatry

## 2020-03-22 DIAGNOSIS — D510 Vitamin B12 deficiency anemia due to intrinsic factor deficiency: Secondary | ICD-10-CM

## 2020-03-22 DIAGNOSIS — B351 Tinea unguium: Secondary | ICD-10-CM | POA: Diagnosis not present

## 2020-03-22 DIAGNOSIS — M79675 Pain in left toe(s): Secondary | ICD-10-CM

## 2020-03-22 DIAGNOSIS — L84 Corns and callosities: Secondary | ICD-10-CM | POA: Diagnosis not present

## 2020-03-22 DIAGNOSIS — M79674 Pain in right toe(s): Secondary | ICD-10-CM

## 2020-03-22 NOTE — Progress Notes (Signed)
Subjective:  Patient ID: Jillian Eaton, female    DOB: 1951-02-05,  MRN: [id number]  69 y.o. female presents with painful corn(s) b/l feet and painful thick toenails that are difficult to trim. Pain interferes with ambulation. Aggravating factors include wearing enclosed shoe gear. Pain is relieved with periodic professional debridement.   [name], MD is her PCP. Last visit was 01/02/2020.   She has h/o pernicious anemia. Receives B-12 injections.  Review of Systems: Negative except as noted in the HPI. Denies N/V/F/Ch. Past Medical History:  Diagnosis Date  . Anemia    iron and pernicious  . Arthritis   . CHF (congestive heart failure) ([place]) 2018  . Complication of anesthesia    BP dropped   . DOE (dyspnea on exertion)    2D ECHO, 02/12/2012 - EF 60-65%, moderate concentric hypertrophy  . Dyspnea   . Dysrhythmia    a-fib  . Fibromyalgia    nerve pain"left side at waist level" "can't lay on that side without pain" , "HOB elevation helps"; pt. thinks this has resolved after 200lb weight loss9-17- since i lost the wieght , i dont  think i have this anymore   . Headache 04/2019  . Heart murmur   . Hematuria - cause not known    resolved   . History of kidney stones    x 2 '13, '14 surgery to remove  . Pneumonia    aspiration  04/2016  . Presence of permanent cardiac pacemaker   . SBO (small bowel obstruction) ([place])    rqueired admission 2020  . Thyroid disease    "goiter"  . Transfusion history    10 yrs+   Past Surgical History:  Procedure Laterality Date  . ABDOMINAL ADHESION SURGERY  2004   open LOA - in [place]  . AV NODE ABLATION N/A 06/20/2019   Procedure: AV NODE ABLATION;  Surgeon: Evans Lance, MD;  Location: [facility];  Service: Cardiovascular;  Laterality: N/A;  . BALLOON DILATION N/A 07/25/2019   Procedure: BALLOON DILATION;  Surgeon: Carol Ada, MD;  Location: [facility];  Service: Endoscopy;  Laterality: N/A;  . CARDIAC  CATHETERIZATION  04/04/2010   No significant obstructive coronary artery disease  . CARDIOVERSION N/A 08/08/2017   Procedure: CARDIOVERSION;  Surgeon: Pixie Casino, MD;  Location: [facility];  Service: Cardiovascular;  Laterality: N/A;  . CARDIOVERSION N/A 06/18/2019   Procedure: CARDIOVERSION;  Surgeon: Pixie Casino, MD;  Location: [facility];  Service: Cardiovascular;  Laterality: N/A;  . CHOLECYSTECTOMY  1990  . COLONOSCOPY W/ POLYPECTOMY    . COLONOSCOPY WITH PROPOFOL N/A 04/10/2014   Procedure: COLONOSCOPY WITH PROPOFOL;  Surgeon: Beryle Beams, MD;  [facility];  Service: Endoscopy;  Laterality: N/A;  . CYSTOSCOPY/URETEROSCOPY/HOLMIUM LASER/STENT PLACEMENT Right 08/13/2019   Procedure: CYSTOSCOPY/RETROGRADE/URETEROSCOPY/HOLMIUM LASER/STENT PLACEMENT;  Surgeon: Ceasar Mons, MD;  [facility];  Service: Urology;  Laterality: Right;  ONLY NEEDS 45 MIN  . ESOPHAGOGASTRODUODENOSCOPY (EGD) WITH PROPOFOL N/A 07/25/2019   Procedure: ESOPHAGOGASTRODUODENOSCOPY (EGD) WITH PROPOFOL;  Surgeon: Carol Ada, MD;  [facility];  Service: Endoscopy;  Laterality: N/A;  . EYE SURGERY     lasik 20-25 yrs ago  . GASTROPLASTY VERTICAL BANDED  1985  . [place]   In [place] for SBO  . MALONEY DILATION  07/25/2019   Procedure: Venia Minks DILATION;  Surgeon: Carol Ada, MD;  [facility];  Service: Endoscopy;;  . PACEMAKER IMPLANT N/A 06/20/2019  Procedure: PACEMAKER IMPLANT;  Surgeon: Evans Lance, MD;  Location: [facility];  Service: Cardiovascular;  Laterality: N/A;  . REVERSE SHOULDER ARTHROPLASTY Right 03/29/2018   Procedure: REVERSE SHOULDER ARTHROPLASTY;  Surgeon: Netta Cedars, MD;  Location: [facility];  Service: Orthopedics;  Laterality: Right;  . ROUX-EN-Y GASTRIC BYPASS  1992   Conversion VBG to RnYGB in [place], [place]  . SHOULDER ARTHROSCOPY WITH SUBACROMIAL DECOMPRESSION  2016   Dr Berenice Primas,  [place], [place]  . SMALL INTESTINE SURGERY  2000   SBO - LOA w SB resection  . TOTAL KNEE ARTHROPLASTY Left 11/14/2018   Procedure: TOTAL KNEE ARTHROPLASTY;  Surgeon: Paralee Cancel, MD;  [facility];  Service: Orthopedics;  Laterality: Left;  70 mins  . TOTAL KNEE ARTHROPLASTY Right 04/29/2019   Procedure: TOTAL KNEE ARTHROPLASTY;  Surgeon: Paralee Cancel, MD;  [facility];  Service: Orthopedics;  Laterality: Right;  70 mins  . TOTAL THYROIDECTOMY  06/26/2012   Dr Celine Ahr, [facility]  . TUBAL LIGATION  1986  . URETHRAL DILATION  2012   w cystoscopy.  Dr Gaynelle Arabian  . UTERINE FIBROID EMBOLIZATION     Patient Active Problem List   Diagnosis Date Noted  . Complete heart block ([place]) 09/24/2019  . Pacemaker 09/24/2019  . Iron deficiency anemia 09/15/2019  . Atrial fibrillation with RVR ([place]) 06/15/2019  . Fatigue 06/14/2019  . S/P right TKA 04/29/2019  . Medication management 03/11/2019  . Stiffness of left knee 12/12/2018  . Acute on chronic respiratory failure with hypoxia ([place]) 12/07/2018  . Normocytic anemia 12/07/2018  . Asthma 12/07/2018  . S/P left TKA 11/14/2018  . Status post total left knee replacement 11/14/2018  . Degenerative joint disease involving multiple joints on both sides of body 07/31/2018  . Rotator cuff tear arthropathy of right shoulder 04/05/2018  . Overactive bladder 04/05/2018  . Steroid-induced hyperglycemia   . Generalized OA   . 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 NICM (nonischemic cardiomyopathy) ([place]) 01/15/2018  . Osteoporosis 12/13/2017  . Rotator cuff tear arthropathy 11/29/2017  . Chronic diastolic CHF (congestive heart failure) ([place]) 09/28/2017  . Acute renal failure superimposed on stage 3 chronic kidney disease ([place]) 09/07/2017  . Hypomagnesemia with secondary hypocalcemia 09/07/2017  . 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 Hypercapnemia 07/13/2017  . AKI (acute kidney injury) ([place]) 07/10/2017  .  Vertigo 07/08/2017  . Blurring of visual image 07/08/2017  . On anticoagulant therapy 06/19/2017  . Chronic respiratory failure with hypoxia ([place]) 04/19/2017  . GERD (gastroesophageal reflux disease) 08/28/2016  . 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 Depressive disorder 08/28/2016  . Iatrogenic hypocalcemia 08/28/2016  . Chronic pain syndrome   . Acute diastolic (congestive) heart failure ([place])   . Oropharyngeal dysphagia   . Chronic atrial fibrillation ([place]) 04/28/2016  . Vocal cord dysfunction 04/28/2016  . Thrombocytopenia ([place]) 04/28/2016  . Seizures ([place])   . Preoperative cardiovascular examination   . 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  . Leg swelling 10/19/2015  . Acute on chronic respiratory failure with hypoxia and hypercapnia ([place]) 08/03/2015  . Bilateral leg edema 05/11/2015  . Essential hypertension 05/11/2015  . Nephrolithiasis 04/12/2015  . Numbness in both hands 04/12/2015  . Complete tear of left rotator cuff 11/19/2014  . Primary osteoarthritis of both knees 03/24/2014  . Chronic asthmatic bronchitis ([place]) 07/30/2013  . Bariatric surgery status 07/30/2013  . Urge incontinence of urine 07/30/2013  . Vitamin D deficiency 07/30/2013  . B-complex deficiency 07/30/2013  .  Cough 07/30/2013  . Gross hematuria 07/30/2013  . Palpitations 07/30/2013  . Right flank pain 07/30/2013  . SBO (small bowel obstruction) ([place]) 06/07/2013  . Pulmonary HTN ([place]) 01/08/2013  . Insomnia 03/22/2012  . Mild intermittent asthma with acute exacerbation 03/22/2012  . Fibromyalgia 10/26/2011  . Anemia of chronic disease 03/28/2011  . Obesity (BMI 30-39.9) 09/22/2010    Current Outpatient Medications:  .  acetaminophen (TYLENOL 8 HOUR) 650 MG CR tablet, Tylenol 650 mg tablet,extended release  Take 2 tablets twice a day by oral route., Disp: , Rfl:  .  albuterol (VENTOLIN HFA) 108 (90 Base) MCG/ACT inhaler, Inhale 2 puffs into the lungs every 6 (six) hours as needed for  wheezing or shortness of breath., Disp: , Rfl:  .  albuterol (VENTOLIN HFA) 108 (90 Base) MCG/ACT inhaler, albuterol sulfate HFA 90 mcg/actuation aerosol inhaler  TAKE 2 PUFFS BY MOUTH EVERY 6 HOURS AS NEEDED FOR WHEEZE OR SHORTNESS OF BREATH, Disp: , Rfl:  .  amoxicillin (AMOXIL) 500 MG capsule, Take by mouth., Disp: , Rfl:  .  apixaban (ELIQUIS) 5 MG TABS tablet, Take 1 tablet (5 mg total) by mouth 2 (two) times daily. Please resume eliquis on 5/5 pm,, Disp: 14 tablet, Rfl: 0 .  benzonatate (TESSALON) 200 MG capsule, benzonatate 200 mg capsule, Disp: , Rfl:  .  benzonatate (TESSALON) 200 MG capsule, Take by mouth., Disp: , Rfl:  .  calcitRIOL (ROCALTROL) 0.25 MCG capsule, Take 1 capsule (0.25 mcg total) by mouth daily., Disp: 90 capsule, Rfl: 3 .  celecoxib (CELEBREX) 200 MG capsule, Take 200 mg by mouth daily., Disp: , Rfl:  .  celecoxib (CELEBREX) 200 MG capsule, celecoxib 200 mg capsule, Disp: , Rfl:  .  clotrimazole (LOTRIMIN) 1 % cream, APPLY TO AFFECTED AREA TWICE A DAY (Patient taking differently: Apply 1 application topically 2 (two) times daily as needed (affected area(s) of feet). ), Disp: 45 g, Rfl: 0 .  cyanocobalamin (,VITAMIN B-12,) 1000 MCG/ML injection, Inject 1 mL (1,000 mcg total) into the muscle every 30 (thirty) days., Disp: 1 mL, Rfl: 0 .  cyclobenzaprine (FLEXERIL) 10 MG tablet, Take 20 mg by mouth at bedtime. , Disp: , Rfl:  .  docusate sodium (COLACE) 100 MG capsule, Take 100 mg by mouth daily., Disp: , Rfl:  .  Docusate Sodium (DSS) 100 MG CAPS, docusate sodium 100 mg capsule, Disp: , Rfl:  .  DULoxetine (CYMBALTA) 60 MG capsule, Take 1 capsule (60 mg total) by mouth daily., Disp: 30 capsule, Rfl: 0 .  eszopiclone (LUNESTA) 2 MG TABS tablet, Take 1 tablet (2 mg total) by mouth at bedtime., Disp: 20 tablet, Rfl: 0 .  famotidine (PEPCID) 20 MG tablet, Pepcid AC 20 mg tablet  Take 1 tablet every day by oral route., Disp: , Rfl:  .  FOLIC ACID PO, Take 1 tablet by mouth  daily., Disp: , Rfl:  .  HYDROcodone-acetaminophen (NORCO) 10-325 MG tablet, hydrocodone 10 mg-acetaminophen 325 mg tablet, Disp: , Rfl:  .  HYDROcodone-acetaminophen (NORCO) 10-325 MG tablet, Take 1 tablet by mouth 3 (three) times daily as needed., Disp: , Rfl:  .  levothyroxine (SYNTHROID) 137 MCG tablet, TAKE 1 TABLET (137 MCG TOTAL) BY MOUTH DAILY BEFORE BREAKFAST., Disp: 90 tablet, Rfl: 1 .  magnesium oxide (MAG-OX) 400 (241.3 Mg) MG tablet, Take 400 mg by mouth daily. , Disp: , Rfl:  .  magnesium oxide (MAG-OX) 400 MG tablet, Take 1 tablet by mouth daily., Disp: , Rfl:  .  magnesium oxide (MAG-OX) 400 MG tablet, magnesium oxide 400 mg (241.3 mg magnesium) tablet, Disp: , Rfl:  .  montelukast (SINGULAIR) 10 MG tablet, TAKE 1 TABLET BY MOUTH EVERYDAY AT BEDTIME, Disp: 90 tablet, Rfl: 0 .  montelukast (SINGULAIR) 10 MG tablet, montelukast 10 mg tablet  Take 1 tablet every day by oral route., Disp: , Rfl:  .  oxybutynin (DITROPAN) 5 MG tablet, oxybutynin chloride 5 mg tablet  TAKE 1 TABLET BY MOUTH EVERY 8 HOURS AS NEEDED FOR BLADDER SPASMS., Disp: , Rfl:  .  predniSONE (DELTASONE) 5 MG tablet, prednisone 5 mg tablets in a dose pack  TAKE AS DIRECTED, Disp: , Rfl:  .  predniSONE (STERAPRED UNI-PAK 21 TAB) 5 MG (21) TBPK tablet, Take by mouth., Disp: , Rfl:  .  SUPREP BOWEL PREP KIT 17.5-3.13-1.6 GM/177ML SOLN, Take by mouth., Disp: , Rfl:  .  traMADol (ULTRAM) 50 MG tablet, tramadol 50 mg tablet  Take 1 tablet 3 times a day by oral route as needed., Disp: , Rfl:  .  traMADol (ULTRAM) 50 MG tablet, tramadol 50 mg tablet  Take 2 tablets twice a day by oral route as needed., Disp: , Rfl:  .  traMADol (ULTRAM) 50 MG tablet, Take 50 mg by mouth every 6 (six) hours as needed., Disp: , Rfl:  .  traZODone (DESYREL) 50 MG tablet, Take 1 tablet (50 mg total) by mouth at bedtime., Disp: 30 tablet, Rfl: 0 .  Vitamin D, Ergocalciferol, (DRISDOL) 1.25 MG (50000 UT) CAPS capsule, Take 1 capsule (50,000 Units  total) by mouth every Wednesday., Disp: 4 capsule, Rfl: 0 Allergies  Allergen Reactions  . Hydrocodone Itching        Social History   Tobacco Use  Smoking Status Never Smoker  Smokeless Tobacco Never Used    Objective:   Constitutional Pt is a pleasant 70 y.o. African American female, in NAD.Marland Kitchen  Vascular Capillary refill time to digits immediate b/l. Palpable pedal pulses b/l LE. Pedal hair absent. Lower extremity skin temperature gradient within normal limits. No cyanosis or clubbing noted.  Neurologic Normal speech. Oriented to person, place, and time. Protective sensation intact 5/5 intact bilaterally with 10g monofilament b/l. Vibratory sensation intact b/l.  Dermatologic Pedal skin with normal turgor, texture and tone bilaterally. No open wounds bilaterally. No interdigital macerations bilaterally. Toenails 1-5 b/l elongated, discolored, dystrophic, thickened, crumbly with subungual debris and tenderness to dorsal palpation. Hyperkeratotic lesion(s) L 5th toe and R 5th toe.  No erythema, no edema, no drainage, no flocculence.  Orthopedic: Normal muscle strength 5/5 to all lower extremity muscle groups bilaterally. No pain crepitus or joint limitation noted with ROM b/l. Hammertoes noted to the L 5th toe and R 5th toe.   Radiographs: None Assessment:   1. Pain due to onychomycosis of toenails of both feet   2. Corns   3. Pernicious anemia    Plan:  Patient was evaluated and treated and all questions answered.  Onychomycosis with pain -Nails palliatively debridement as below.  Procedure: Nail Debridement Rationale: Pain Type of Debridement: manual, sharp debridement. Instrumentation: Nail nipper, rotary burr. Number of Nails: 10  -Examined patient. -No new findings. No new orders. -Toenails 1-5 b/l were debrided in length and girth with sterile nail nippers and dremel without iatrogenic bleeding.  -Corn(s) L 5th toe and R 5th toe pared utilizing sterile scalpel blade  without complication or incident. Total number debrided=2. -Patient to continue soft, supportive shoe gear daily. -Patient/POA to call should there be  question/concern in the interim.  Return in about 3 months (around 06/19/2020).  Marzetta Board, DPM

## 2020-03-25 DIAGNOSIS — M47816 Spondylosis without myelopathy or radiculopathy, lumbar region: Secondary | ICD-10-CM | POA: Diagnosis not present

## 2020-03-27 NOTE — Progress Notes (Signed)
Remote pacemaker transmission.   

## 2020-03-28 ENCOUNTER — Telehealth: Payer: Self-pay | Admitting: Student

## 2020-03-28 NOTE — Telephone Encounter (Addendum)
    The patient called the after hours line reporting dyspnea on exertion for the past week. Denies any associated chest pain or palpitations. She has checked her oxygen levels and they have been stable at > 95% and HR has been in the 70's to 80's. She did have COVID-19 in 02/2020. I reviewed with her there are many potential causes of dyspnea on exertion. I would initially recommended she see her PCP for evaluation, especially given her recent COVID diagnosis. Would also benefit from routine labs, including CBC, given her chronic anemia. If initial workup unrevealing, would investigate cardiac causes. We reviewed warning signs to monitor for that would warrant ED evaluation in the interim. She voiced understanding of this and was appreciative of the call. Will route to Dr. [name] as an Juluis Rainier.     Signed, Erma Heritage, PA-C 03/28/2020, 6:25 PM Pager: [phone]

## 2020-03-28 NOTE — Telephone Encounter (Signed)
Thanks .. sorry to see she had COVID recently, can't catch a break  [place]

## 2020-03-29 ENCOUNTER — Telehealth: Payer: Self-pay

## 2020-03-29 DIAGNOSIS — M5134 Other intervertebral disc degeneration, thoracic region: Secondary | ICD-10-CM | POA: Diagnosis not present

## 2020-03-29 NOTE — Telephone Encounter (Signed)
Left VM to return call. Will send to pharmacy - unclear when her recent COVID infection started and if this will impact elqiuis hold.

## 2020-03-29 NOTE — Telephone Encounter (Signed)
Patient with diagnosis of atrial fibrillation on Eliquis for anticoagulation.    Procedure: thoracic ESI Date of procedure: 04/13/20   CHA2DS2-VASc Score = 4  This indicates a 4.8% annual risk of stroke. The patient's score is based upon: CHF History: Yes HTN History: Yes Diabetes History: No Stroke History: No Vascular Disease History: No Age Score: 1 Gender Score: 1   CrCl 69.9 Platelet count 209  Per office protocol, patient can hold Eliquis for 3 days prior to procedure.    Patient will not need bridging with Lovenox (enoxaparin) around procedure.

## 2020-03-29 NOTE — Telephone Encounter (Signed)
   [facility] Pre-operative Risk Assessment    HEARTCARE STAFF: - Please ensure there is not already an duplicate clearance open for this procedure. - Under Visit Info/Reason for Call, type in Other and utilize the format Clearance MM/DD/YY or Clearance TBD. Do not use dashes or single digits. - If request is for dental extraction, please clarify the # of teeth to be extracted.  Request for surgical clearance:  1. What type of surgery is being performed? Thoracic ESI   2. When is this surgery scheduled? 04/13/2020   3. What type of clearance is required (medical clearance vs. Pharmacy clearance to hold med vs. Both)? Both   4. Are there any medications that need to be held prior to surgery and how long?Eliquis 3 Days prior   5. Practice name and name of physician performing surgery? [facility]   6. What is the office phone number? [phone]   7.   What is the office fax number? [phone]  8.   Anesthesia type (None, local, MAC, general) ? None   Monia Pouch 03/29/2020, 2:51 PM  _________________________________________________________________   (provider comments below)

## 2020-03-30 DIAGNOSIS — D631 Anemia in chronic kidney disease: Secondary | ICD-10-CM | POA: Diagnosis not present

## 2020-03-30 NOTE — Telephone Encounter (Signed)
   Primary Cardiologist: Pixie Casino, MD  Chart reviewed as part of pre-operative protocol coverage. Patient was contacted 03/30/2020 in reference to pre-operative risk assessment for pending surgery as outlined below.  Jillian Eaton was last seen on 10/16/19 by Dr. [name].  Since that day, Jillian Eaton has done well. She is recovering from [place] in early January. She is able to complete 4.0 METS without angina.   Per our clinical pharmacist: Patient with diagnosis of atrial fibrillation on Eliquis for anticoagulation.    Procedure: thoracic ESI Date of procedure: 04/13/20   CHA2DS2-VASc Score = 4  This indicates a 4.8% annual risk of stroke. The patient's score is based upon: CHF History: Yes HTN History: Yes Diabetes History: No Stroke History: No Vascular Disease History: No Age Score: 1 Gender Score: 1   CrCl 69.9 Platelet count 209  Per office protocol, patient can hold Eliquis for 3 days prior to procedure.    Patient will not need bridging with Lovenox (enoxaparin) around procedure.  Therefore, based on ACC/AHA guidelines, the patient would be at acceptable risk for the planned procedure without further cardiovascular testing.   The patient was advised that if she develops new symptoms prior to surgery to contact our office to arrange for a follow-up visit, and she verbalized understanding.  I will route this recommendation to the requesting party via Epic fax function and remove from pre-op pool. Please call with questions.  Jillian Lin Laylaa Guevarra, PA 03/30/2020, 3:45 PM

## 2020-03-30 NOTE — Telephone Encounter (Signed)
Follow Up:      Pt is returning a call from yesterday,

## 2020-04-01 ENCOUNTER — Ambulatory Visit (INDEPENDENT_AMBULATORY_CARE_PROVIDER_SITE_OTHER): Payer: Medicare Other | Admitting: Adult Health

## 2020-04-01 ENCOUNTER — Other Ambulatory Visit: Payer: Self-pay

## 2020-04-01 ENCOUNTER — Encounter: Payer: Self-pay | Admitting: Adult Health

## 2020-04-01 ENCOUNTER — Ambulatory Visit (INDEPENDENT_AMBULATORY_CARE_PROVIDER_SITE_OTHER): Payer: Medicare Other

## 2020-04-01 VITALS — BP 118/70 | HR 63 | Temp 97.2°F | Ht 67.0 in | Wt 182.6 lb

## 2020-04-01 DIAGNOSIS — J4540 Moderate persistent asthma, uncomplicated: Secondary | ICD-10-CM | POA: Diagnosis not present

## 2020-04-01 DIAGNOSIS — D638 Anemia in other chronic diseases classified elsewhere: Secondary | ICD-10-CM | POA: Diagnosis not present

## 2020-04-01 DIAGNOSIS — J45909 Unspecified asthma, uncomplicated: Secondary | ICD-10-CM | POA: Diagnosis not present

## 2020-04-01 DIAGNOSIS — R0602 Shortness of breath: Secondary | ICD-10-CM

## 2020-04-01 DIAGNOSIS — I5032 Chronic diastolic (congestive) heart failure: Secondary | ICD-10-CM | POA: Diagnosis not present

## 2020-04-01 NOTE — Patient Instructions (Addendum)
Labs today .  Chest xray today .  Follow up with cardiology and hematology .  Activity as tolerated.  Albuterol inhaler as needed.  Continue on Singulair daily. Claritin 10 mg daily as needed Follow-up with Dr. Halford Chessman in 3 months and As needed   Please contact office for sooner follow up if symptoms do not improve or worsen or seek emergency care

## 2020-04-01 NOTE — Progress Notes (Signed)
@Patient  ID: Jillian Eaton, female    DOB: 1950-08-03, 70 y.o.   MRN: [id number]  Chief Complaint  Patient presents with  . Follow-up    Referring provider: [name], MD  HPI: 70 year old female never smoker followed for shortness of breath, allergic rhinitis , mild asthma  Medical history significant for  A Fib , chronic combined CHF and CKD.    TEST/EVENTS :   PFT 10/19/15 >> FEV1 1.38 (60%), FEV1% 82, TLC 3.34 (60%), DLCO 74%, +BD  ABG 07/08/16 >> pH 7.38, PCO2 51.9, PO2 70  PFT 03/11/19 >> FEV1 1.44 (65%), FEV1% 83, TLC 3.97 (72%), DLCO 72%, +BD  Chest imaging:   CT angio chest 12/06/18 >> enlarged PA, CM, mosaic attenuation with GGO RUL  CT chest 06/14/19 >> scarring RLL  Sleep tests:   ONO with RA 07/04/19 >>test time 7 hrs 30 min. Basal SpO2 93.4%, low SpO2 52%. Spent 18.5 min with SpO2 <88%.  Cardiac tests:   Echo 06/15/19 >> EF 60 to 65%, RVSP 50.7, mod LA/RA dilation, mod TR  04/01/2020 Follow up : Mild intermittent Asthma, dyspnea, AR  Patient returns for a 97-monthfollow-up.  Patient is followed for allergic rhinitis.  Mild intermittent asthma.  And shortness of breath.  Since last visit patient says she is doing well.  Last visit Arnuity was stopped.  And she was started on Singulair 10 mg daily.  Patient feels well with no flare of cough or wheezing.  She had no flare of symptoms after Arnuity.    Patient says she get winded more last couple weeks . Decreased activity tolerance, feels fatigued and low energy . Very frustrated with decreased things she can not do.   No increased albuterol use. No cough or wheezing .  Had Covid 19 early January 2022. Did fine during acute illness and even after covid. But has noticed that she is more short of breath with activities and increased activity tolerance.   Has A fib on Eliquis , endorses compliance. Denies chest pain , orthopnea or calf pain /swelling .   Allergies  Allergen Reactions  .  Hydrocodone Itching         Immunization History  Administered Date(s) Administered  . Fluad Quad(high Dose 65+) 12/08/2018  . Influenza Inj Mdck Quad Pf 11/27/2017  . Influenza,inj,quad, With Preservative 02/22/2016, 03/05/2017  . Influenza-Unspecified 11/19/2014, 03/21/2016  . Moderna Sars-Covid-2 Vaccination 03/21/2019, 04/18/2019  . Pneumococcal Polysaccharide-23 05/01/2016  . Tdap 10/22/2018  . Zoster Recombinat (Shingrix) 01/09/2019    Past Medical History:  Diagnosis Date  . Anemia    iron and pernicious  . Arthritis   . CHF (congestive heart failure) ([place] 2018  . Complication of anesthesia    BP dropped   . DOE (dyspnea on exertion)    2D ECHO, 02/12/2012 - EF 60-65%, moderate concentric hypertrophy  . Dyspnea   . Dysrhythmia    a-fib  . Fibromyalgia    nerve pain"left side at waist level" "can't lay on that side without pain" , "HOB elevation helps"; pt. thinks this has resolved after 200lb weight loss9-17- since i lost the wieght , i dont  think i have this anymore   . Headache 04/2019  . Heart murmur   . Hematuria - cause not known    resolved   . History of kidney stones    x 2 '13, '14 surgery to remove  . Pneumonia    aspiration  04/2016  . Presence of permanent cardiac  pacemaker   . SBO (small bowel obstruction) ([place])    rqueired admission 2020  . Thyroid disease    "goiter"  . Transfusion history    10 yrs+    Tobacco History: Social History   Tobacco Use  Smoking Status Never Smoker  Smokeless Tobacco Never Used   Counseling given: Not Answered   Outpatient Medications Prior to Visit  Medication Sig Dispense Refill  . acetaminophen (TYLENOL 8 HOUR) 650 MG CR tablet Tylenol 650 mg tablet,extended release  Take 2 tablets twice a day by oral route.    Marland Kitchen albuterol (VENTOLIN HFA) 108 (90 Base) MCG/ACT inhaler Inhale 2 puffs into the lungs every 6 (six) hours as needed for wheezing or shortness of breath.    Marland Kitchen albuterol (VENTOLIN HFA) 108 (90  Base) MCG/ACT inhaler albuterol sulfate HFA 90 mcg/actuation aerosol inhaler  TAKE 2 PUFFS BY MOUTH EVERY 6 HOURS AS NEEDED FOR WHEEZE OR SHORTNESS OF BREATH    . amoxicillin (AMOXIL) 500 MG capsule Take by mouth.    Marland Kitchen apixaban (ELIQUIS) 5 MG TABS tablet Take 1 tablet (5 mg total) by mouth 2 (two) times daily. Please resume eliquis on 5/5 pm, 14 tablet 0  . benzonatate (TESSALON) 200 MG capsule benzonatate 200 mg capsule    . benzonatate (TESSALON) 200 MG capsule Take by mouth.    . calcitRIOL (ROCALTROL) 0.25 MCG capsule Take 1 capsule (0.25 mcg total) by mouth daily. 90 capsule 3  . celecoxib (CELEBREX) 200 MG capsule Take 200 mg by mouth daily.    . celecoxib (CELEBREX) 200 MG capsule celecoxib 200 mg capsule    . clotrimazole (LOTRIMIN) 1 % cream APPLY TO AFFECTED AREA TWICE A DAY (Patient taking differently: Apply 1 application topically 2 (two) times daily as needed (affected area(s) of feet). ) 45 g 0  . cyanocobalamin (,VITAMIN B-12,) 1000 MCG/ML injection Inject 1 mL (1,000 mcg total) into the muscle every 30 (thirty) days. 1 mL 0  . cyclobenzaprine (FLEXERIL) 10 MG tablet Take 20 mg by mouth at bedtime.     . docusate sodium (COLACE) 100 MG capsule Take 100 mg by mouth daily.    Mariane Baumgarten Sodium (DSS) 100 MG CAPS docusate sodium 100 mg capsule    . DULoxetine (CYMBALTA) 60 MG capsule Take 1 capsule (60 mg total) by mouth daily. 30 capsule 0  . eszopiclone (LUNESTA) 2 MG TABS tablet Take 1 tablet (2 mg total) by mouth at bedtime. 20 tablet 0  . famotidine (PEPCID) 20 MG tablet Pepcid AC 20 mg tablet  Take 1 tablet every day by oral route.    Marland Kitchen FOLIC ACID PO Take 1 tablet by mouth daily.    Marland Kitchen HYDROcodone-acetaminophen (NORCO) 10-325 MG tablet hydrocodone 10 mg-acetaminophen 325 mg tablet    . HYDROcodone-acetaminophen (NORCO) 10-325 MG tablet Take 1 tablet by mouth 3 (three) times daily as needed.    Marland Kitchen levothyroxine (SYNTHROID) 137 MCG tablet TAKE 1 TABLET (137 MCG TOTAL) BY MOUTH DAILY  BEFORE BREAKFAST. 90 tablet 1  . magnesium oxide (MAG-OX) 400 (241.3 Mg) MG tablet Take 400 mg by mouth daily.     . magnesium oxide (MAG-OX) 400 MG tablet Take 1 tablet by mouth daily.    . magnesium oxide (MAG-OX) 400 MG tablet magnesium oxide 400 mg (241.3 mg magnesium) tablet    . montelukast (SINGULAIR) 10 MG tablet TAKE 1 TABLET BY MOUTH EVERYDAY AT BEDTIME 90 tablet 0  . montelukast (SINGULAIR) 10 MG tablet montelukast 10 mg tablet  Take 1 tablet every day by oral route.    Marland Kitchen oxybutynin (DITROPAN) 5 MG tablet oxybutynin chloride 5 mg tablet  TAKE 1 TABLET BY MOUTH EVERY 8 HOURS AS NEEDED FOR BLADDER SPASMS.    Marland Kitchen predniSONE (DELTASONE) 5 MG tablet prednisone 5 mg tablets in a dose pack  TAKE AS DIRECTED    . predniSONE (STERAPRED UNI-PAK 21 TAB) 5 MG (21) TBPK tablet Take by mouth.    Manus Gunning BOWEL PREP KIT 17.5-3.13-1.6 GM/177ML SOLN Take by mouth.    . traMADol (ULTRAM) 50 MG tablet tramadol 50 mg tablet  Take 1 tablet 3 times a day by oral route as needed.    . traMADol (ULTRAM) 50 MG tablet tramadol 50 mg tablet  Take 2 tablets twice a day by oral route as needed.    . traMADol (ULTRAM) 50 MG tablet Take 50 mg by mouth every 6 (six) hours as needed.    . traZODone (DESYREL) 50 MG tablet Take 1 tablet (50 mg total) by mouth at bedtime. 30 tablet 0  . Vitamin D, Ergocalciferol, (DRISDOL) 1.25 MG (50000 UT) CAPS capsule Take 1 capsule (50,000 Units total) by mouth every Wednesday. 4 capsule 0   No facility-administered medications prior to visit.     Review of Systems:   Constitutional:   No  weight loss, night sweats,  Fevers, chills,\ + fatigue, or  lassitude.  HEENT:   No headaches,  Difficulty swallowing,  Tooth/dental problems, or  Sore throat,                No sneezing, itching, ear ache, nasal congestion, post nasal drip,   CV:  No chest pain,  Orthopnea, PND, swelling in lower extremities, anasarca, dizziness, palpitations, syncope.   GI  No heartburn, indigestion,  abdominal pain, nausea, vomiting, diarrhea, change in bowel habits, loss of appetite, bloody stools.   Resp:  .  No chest wall deformity  Skin: no rash or lesions.  GU: no dysuria, change in color of urine, no urgency or frequency.  No flank pain, no hematuria   MS:  No joint pain or swelling.  No decreased range of motion.  No back pain.    Physical Exam    GEN: A/Ox3; pleasant , NAD, well nourished    HEENT:  [place]/AT,    NOSE-clear, THROAT-clear, no lesions, no postnasal drip or exudate noted.   NECK:  Supple w/ fair ROM; no JVD; normal carotid impulses w/o bruits; no thyromegaly or nodules palpated; no lymphadenopathy.    RESP  Clear  P & A; w/o, wheezes/ rales/ or rhonchi. no accessory muscle use, no dullness to percussion  CARD:  RRR, no m/r/g, no peripheral edema, pulses intact, no cyanosis or clubbing.  GI:   Soft & nt; nml bowel sounds; no organomegaly or masses detected.   Musco: Warm bil, no deformities or joint swelling noted.   Neuro: alert, no focal deficits noted.    Skin: Warm, no lesions or rashes    Lab Results:  CBC    Component Value Date/Time   WBC 3.4 (L) 12/12/2019 0843   RBC 3.02 (L) 12/12/2019 0846   RBC 3.07 (L) 12/12/2019 0843   HGB 9.1 (L) 12/12/2019 0843   HGB 11.2 06/13/2019 1639   HGB 11.2 (L) 10/25/2012 1431   HCT 29.7 (L) 12/12/2019 0843   HCT 36.9 06/13/2019 1639   HCT 34.8 10/25/2012 1431   PLT 209 12/12/2019 0843   PLT 360 06/13/2019 1639   MCV  96.7 12/12/2019 0843   MCV 92 06/13/2019 1639   MCV 94.1 10/25/2012 1431   MCH 29.6 12/12/2019 0843   MCHC 30.6 12/12/2019 0843   RDW 14.5 12/12/2019 0843   RDW 14.9 06/13/2019 1639   RDW 15.5 (H) 10/25/2012 1431   LYMPHSABS 1.0 12/12/2019 0843   LYMPHSABS 1.2 10/25/2012 1431   MONOABS 0.2 12/12/2019 0843   MONOABS 0.6 10/25/2012 1431   EOSABS 0.1 12/12/2019 0843   EOSABS 0.2 10/25/2012 1431   BASOSABS 0.0 12/12/2019 0843   BASOSABS 0.0 10/25/2012 1431    BMET    Component  Value Date/Time   NA 139 08/07/2019 1155   NA 141 11/20/2018 1548   K 4.1 08/07/2019 1155   CL 106 08/07/2019 1155   CO2 25 08/07/2019 1155   GLUCOSE 79 08/07/2019 1155   BUN 20 08/07/2019 1155   BUN 21 11/20/2018 1548   CREATININE 1.00 01/28/2020 1623   CREATININE 1.23 (H) 05/15/2019 0845   CREATININE 1.01 (H) 11/27/2017 1029   CALCIUM 7.9 (L) 08/07/2019 1155   CALCIUM 5.5 (LL) 07/10/2017 2104   GFRNONAA 55 (L) 08/07/2019 1155   GFRNONAA 46 (L) 05/15/2019 0845   GFRNONAA 58 (L) 11/27/2017 1029   GFRAA >60 08/07/2019 1155   GFRAA 53 (L) 05/15/2019 0845   GFRAA 68 11/27/2017 1029    BNP    Component Value Date/Time   BNP 141.0 (H) 06/14/2019 1430    ProBNP    Component Value Date/Time   PROBNP 251.0 (H) 06/13/2019 1429    Imaging: CUP PACEART REMOTE DEVICE CHECK  Result Date: 03/19/2020 Scheduled remote reviewed. Normal device function.  Next remote 91 days. HB   denosumab (PROLIA) injection 60 mg    Date Action Dose Route User   02/04/2020 1046 Given 60 mg Subcutaneous (Right Arm) [place], Tileshia, RMA      PFT Results Latest Ref Rng & Units 03/11/2019 10/19/2015  FVC-Pre L 1.73 1.54  FVC-Predicted Pre % 61 52  FVC-Post L 1.74 1.68  FVC-Predicted Post % 61 57  Pre FEV1/FVC % % 70 74  Post FEV1/FCV % % 83 82  FEV1-Pre L 1.21 1.14  FEV1-Predicted Pre % 54 50  FEV1-Post L 1.44 1.38  DLCO uncorrected ml/min/mmHg 15.87 21.15  DLCO UNC% % 72 74  DLCO corrected ml/min/mmHg - 20.22  DLCO COR %Predicted % - 71  DLVA Predicted % 105 109  TLC L 3.97 -  TLC % Predicted % 72 -  RV % Predicted % 92 -    No results found for: NITRICOXIDE      Assessment & Plan:   No problem-specific Assessment & Plan notes found for this encounter.     Rexene Edison, NP 04/01/2020

## 2020-04-02 ENCOUNTER — Other Ambulatory Visit (INDEPENDENT_AMBULATORY_CARE_PROVIDER_SITE_OTHER): Payer: Medicare Other

## 2020-04-02 DIAGNOSIS — R0602 Shortness of breath: Secondary | ICD-10-CM

## 2020-04-02 DIAGNOSIS — D649 Anemia, unspecified: Secondary | ICD-10-CM | POA: Diagnosis not present

## 2020-04-02 DIAGNOSIS — D638 Anemia in other chronic diseases classified elsewhere: Secondary | ICD-10-CM | POA: Diagnosis not present

## 2020-04-02 LAB — BASIC METABOLIC PANEL
BUN: 24 mg/dL — ABNORMAL HIGH (ref 6–23)
CO2: 23 mEq/L (ref 19–32)
Calcium: 6 mg/dL — ABNORMAL LOW (ref 8.4–10.5)
Chloride: 110 mEq/L (ref 96–112)
Creatinine, Ser: 1.18 mg/dL (ref 0.40–1.20)
GFR: 47.69 mL/min — ABNORMAL LOW (ref >60.00)
Glucose, Bld: 51 mg/dL — ABNORMAL LOW (ref 70–99)
Potassium: 3.4 mEq/L — ABNORMAL LOW (ref 3.5–5.1)
Sodium: 140 mEq/L (ref 135–145)

## 2020-04-02 LAB — IRON,TIBC AND FERRITIN PANEL
%SAT: 81 % (calc) — ABNORMAL HIGH (ref 16–45)
Ferritin: 57 ng/mL (ref 16–288)
Iron: 88 ug/dL (ref 45–160)
TIBC: 109 mcg/dL (calc) — ABNORMAL LOW (ref 250–450)

## 2020-04-02 LAB — BRAIN NATRIURETIC PEPTIDE: Pro B Natriuretic peptide (BNP): 186 pg/mL — ABNORMAL HIGH (ref 0.0–100.0)

## 2020-04-03 ENCOUNTER — Encounter: Payer: Self-pay | Admitting: Oncology

## 2020-04-05 ENCOUNTER — Encounter: Payer: Self-pay | Admitting: Internal Medicine

## 2020-04-06 NOTE — Assessment & Plan Note (Signed)
Appears euvolemic on exam  Check labs and cxr  F/up with cards

## 2020-04-06 NOTE — Assessment & Plan Note (Signed)
Mild intermittent asthma - does not appear to be flared with no cough or wheezing  Check cxr . Unclear etiology of dyspnea and fatigue , Possible post covid  Exam unrevealing  Check labs and chest xray   Plan  Patient Instructions  Labs today .  Chest xray today .  Follow up with cardiology and hematology .  Activity as tolerated.  Albuterol inhaler as needed.  Continue on Singulair daily. Claritin 10 mg daily as needed Follow-up with Dr. Halford Chessman in 3 months and As needed   Please contact office for sooner follow up if symptoms do not improve or worsen or seek emergency care

## 2020-04-06 NOTE — Progress Notes (Signed)
Cardiology Clinic Note   Patient Name: Jillian Eaton [facility] Date of Encounter: 04/07/2020  Primary Care Provider:  [name], MD Primary Cardiologist:  Pixie Casino, MD  Patient Profile    Jillian Eaton 70 year old female presents today for an evaluation of her fatigue and DOE.  Past Medical History    Past Medical History:  Diagnosis Date  . Anemia    iron and pernicious  . Arthritis   . CHF (congestive heart failure) ([place]) 2018  . Complication of anesthesia    BP dropped   . DOE (dyspnea on exertion)    2D ECHO, 02/12/2012 - EF 60-65%, moderate concentric hypertrophy  . Dyspnea   . Dysrhythmia    a-fib  . Fibromyalgia    nerve pain"left side at waist level" "can't lay on that side without pain" , "HOB elevation helps"; pt. thinks this has resolved after 200lb weight loss9-17- since i lost the wieght , i dont  think i have this anymore   . Headache 04/2019  . Heart murmur   . Hematuria - cause not known    resolved   . History of kidney stones    x 2 '13, '14 surgery to remove  . Pneumonia    aspiration  04/2016  . Presence of permanent cardiac pacemaker   . SBO (small bowel obstruction) ([place])    rqueired admission 2020  . Thyroid disease    "goiter"  . Transfusion history    10 yrs+   Past Surgical History:  Procedure Laterality Date  . ABDOMINAL ADHESION SURGERY  2004   open LOA - in [place]  . AV NODE ABLATION N/A 06/20/2019   Procedure: AV NODE ABLATION;  Surgeon: Evans Lance, MD;  Location: [place] [facility];  Service: Cardiovascular;  Laterality: N/A;  . BALLOON DILATION N/A 07/25/2019   Procedure: BALLOON DILATION;  Surgeon: Carol Ada, MD;  Location: [facility];  Service: Endoscopy;  Laterality: N/A;  . CARDIAC CATHETERIZATION  04/04/2010   No significant obstructive coronary artery disease  . CARDIOVERSION N/A 08/08/2017   Procedure: CARDIOVERSION;  Surgeon: Pixie Casino, MD;  Location: [facility] ENDOSCOPY;  Service:  Cardiovascular;  Laterality: N/A;  . CARDIOVERSION N/A 06/18/2019   Procedure: CARDIOVERSION;  Surgeon: Pixie Casino, MD;  Location: [place];  Service: Cardiovascular;  Laterality: N/A;  . CHOLECYSTECTOMY  1990  . COLONOSCOPY W/ POLYPECTOMY    . COLONOSCOPY WITH PROPOFOL N/A 04/10/2014   Procedure: COLONOSCOPY WITH PROPOFOL;  Surgeon: Beryle Beams, MD;  [facility];  Service: Endoscopy;  Laterality: N/A;  . CYSTOSCOPY/URETEROSCOPY/HOLMIUM LASER/STENT PLACEMENT Right 08/13/2019   Procedure: CYSTOSCOPY/RETROGRADE/URETEROSCOPY/HOLMIUM LASER/STENT PLACEMENT;  Surgeon: Ceasar Mons, MD;  [facility];  Service: Urology;  Laterality: Right;  ONLY NEEDS 45 MIN  . ESOPHAGOGASTRODUODENOSCOPY (EGD) WITH PROPOFOL N/A 07/25/2019   Procedure: ESOPHAGOGASTRODUODENOSCOPY (EGD) WITH PROPOFOL;  Surgeon: Carol Ada, MD;  [facility];  Service: Endoscopy;  Laterality: N/A;  . EYE SURGERY     lasik 20-25 yrs ago  . GASTROPLASTY VERTICAL BANDED  1985  . [place]   In [place] for SBO  . MALONEY DILATION  07/25/2019   Procedure: Venia Minks DILATION;  Surgeon: Carol Ada, MD;  Location: [facility];  Service: Endoscopy;;  . PACEMAKER IMPLANT N/A 06/20/2019   Procedure: PACEMAKER IMPLANT;  Surgeon: Evans Lance, MD;  Location: [place] [facility];  Service: Cardiovascular;  Laterality: N/A;  . REVERSE SHOULDER ARTHROPLASTY Right 03/29/2018  Procedure: REVERSE SHOULDER ARTHROPLASTY;  Surgeon: Netta Cedars, MD;  Location: [place];  Service: Orthopedics;  Laterality: Right;  . ROUX-EN-Y GASTRIC BYPASS  1992   Conversion VBG to RnYGB in [place], [place]  . SHOULDER ARTHROSCOPY WITH SUBACROMIAL DECOMPRESSION  2016   Dr Berenice Primas, [place], [place]  . SMALL INTESTINE SURGERY  2000   SBO - LOA w SB resection  . TOTAL KNEE ARTHROPLASTY Left 11/14/2018   Procedure: TOTAL KNEE ARTHROPLASTY;  Surgeon: Paralee Cancel, MD;  [facility];   Service: Orthopedics;  Laterality: Left;  70 mins  . TOTAL KNEE ARTHROPLASTY Right 04/29/2019   Procedure: TOTAL KNEE ARTHROPLASTY;  Surgeon: Paralee Cancel, MD;  [facility];  Service: Orthopedics;  Laterality: Right;  70 mins  . TOTAL THYROIDECTOMY  06/26/2012   Dr Celine Ahr, [facility]  . TUBAL LIGATION  1986  . URETHRAL DILATION  2012   w cystoscopy.  Dr Gaynelle Arabian  . UTERINE FIBROID EMBOLIZATION      Allergies  Allergies  Allergen Reactions  . Hydrocodone Itching         History of Present Illness    Ms. Willner was  seen by Dr. [name] on 09/09/2018.  During that time she was doing well and losing weight for an upcoming surgery.  Her blood pressure was low normal at that time and her beta-blocker has been decreased due to hypotension.  She was expressing interest in pulmonary rehab and was at baseline with NYHA class II-III symptoms.  Her last echocardiogram on 05/27/2018 showed an LVEF of 50 to 55% mildly increased left ventricular wall thickness, mild hypokinesis of LV inferior septal from mid ventricular to apex mild hypokinesis of LV anterior septum from base to apex.  Her left atria was severely dilated, her right atria severely dilated, trivial pericardial effusion, and aortic valve regurgitation trivial.  She underwent total knee arthroplasty on 11/14/2018.  Her PMH also includes anemia, asthma, CHF, chronic headache, dyspnea on exertion, pulmonary hypertension, essential hypertension, chronic atrial fibrillation acute on chronic respiratory failure with hypoxemia and hypercapnia, GERD, osteoporosis, DJD, CKD stage III, morbid obesity BMI 40-49.9, seizures thrombocytopenia, fibromyalgia, bariatric surgery, vitamin D deficiency, palpitations, status post left total knee replacement, and numbness in both hands.  She presented the clinic 11/20/18 and stated she  had 3 episodes of sharp chest pain .  She describes the pain as sharp lasting for seconds and subsiding with rest.  Pain was  located on the left side of her chest above her left breast.  She stated the pain was nonexertional.  Her pain was reproduced through deep palpation and with movement.  She stated that after her knee surgery she had been relying heavily on her walker and using her arms much more with ambulation.  She also noticed increased shortness of breath however this was only with exertion during physical activity and physical therapy.  Her breathing returned to baseline when she was at rest.  She was seen by Dr. [name] on 10/16/2019.  During that time she was doing well.  She had been seen by Dr. Lovena Le who adjusted her pacemaker.  She was found to be anemic and was placed on iron therapy.  Her weight was stable around 192 pounds.  She denied bleeding issues with Eliquis, although there were concerns for blood loss anemia.  Her dyspnea had improved.  She has known pulmonary hypertension with previous PA pressures of 64 mmHg but is noted to have normal LV systolic function.  She contracted COVID-19  in early January.  She presents to the clinic today for an evaluation of her fatigue and shortness of breath.  She reported that up until a month ago she had done very well and felt well after getting her pacemaker on April, 30th 2021 she reports over the last month she has had and increased fullness and increased DOE.  She reports that she has gained around 10 pounds.  She presented to her PCP today who prescribed furosemide 20 mg for 3 days.  We reviewed her prior labs and noted that her potassium was 3.4.  Her BNP was also elevated at 186.  I will give her supplemental potassium for the next 3 days.  She reports that she has been trying to avoid sodium in her diet.  We discussed the physiology of increased weight, acute on chronic diastolic heart failure, and deconditioning.  She expressed understanding.  She also reported that she contracted COVID-19 the first week of January.  She recovered well and did not require  hospitalization.  I will have her come back to the office for a BMP in 1 week, order an echocardiogram, give her the salty 6 diet sheet and have her follow-up with Dr. [name] as scheduled.  She denies lower extremity edema, fatigue, palpitations, melena, hematuria, hemoptysis, diaphoresis, weakness, presyncope, syncope, orthopnea, and PND.  Home Medications    Prior to Admission medications   Medication Sig Start Date End Date Taking? Authorizing Provider  acetaminophen (TYLENOL 8 HOUR) 650 MG CR tablet Tylenol 650 mg tablet,extended release  Take 2 tablets twice a day by oral route.    [provider]  albuterol (VENTOLIN HFA) 108 (90 Base) MCG/ACT inhaler Inhale 2 puffs into the lungs every 6 (six) hours as needed for wheezing or shortness of breath.    [provider]  apixaban (ELIQUIS) 5 MG TABS tablet Take 1 tablet (5 mg total) by mouth 2 (two) times daily. Please resume eliquis on 5/5 pm, 12/21/19   Isaiah Serge, NP  benzonatate (TESSALON) 200 MG capsule benzonatate 200 mg capsule    [provider]  calcitRIOL (ROCALTROL) 0.25 MCG capsule Take 1 capsule (0.25 mcg total) by mouth daily. 07/09/19   Philemon Kingdom, MD  celecoxib (CELEBREX) 200 MG capsule Take 200 mg by mouth daily. 12/12/19   [provider]  cyanocobalamin (,VITAMIN B-12,) 1000 MCG/ML injection Inject 1 mL (1,000 mcg total) into the muscle every 30 (thirty) days. 04/05/18   Hennie Duos, MD  cyclobenzaprine (FLEXERIL) 10 MG tablet Take 20 mg by mouth at bedtime.     [provider]  Docusate Sodium (DSS) 100 MG CAPS docusate sodium 100 mg capsule    [provider]  DULoxetine (CYMBALTA) 60 MG capsule Take 1 capsule (60 mg total) by mouth daily. 04/05/18   Hennie Duos, MD  eszopiclone (LUNESTA) 2 MG TABS tablet Take 1 tablet (2 mg total) by mouth at bedtime. 04/05/18   Hennie Duos, MD  famotidine (PEPCID) 20 MG tablet Pepcid AC 20 mg tablet  Take 1  tablet every day by oral route.    [provider]  FOLIC ACID PO Take 1 tablet by mouth daily.    [provider]  levothyroxine (SYNTHROID) 137 MCG tablet TAKE 1 TABLET (137 MCG TOTAL) BY MOUTH DAILY BEFORE BREAKFAST. 11/17/19   Philemon Kingdom, MD  magnesium oxide (MAG-OX) 400 (241.3 Mg) MG tablet Take 400 mg by mouth daily.  04/29/19   [provider]  montelukast (  SINGULAIR) 10 MG tablet TAKE 1 TABLET BY MOUTH EVERYDAY AT BEDTIME 03/08/20   Chesley Mires, MD  traMADol (ULTRAM) 50 MG tablet tramadol 50 mg tablet  Take 1 tablet 3 times a day by oral route as needed.    [provider]  traZODone (DESYREL) 50 MG tablet Take 1 tablet (50 mg total) by mouth at bedtime. 04/05/18   Hennie Duos, MD  Vitamin D, Ergocalciferol, (DRISDOL) 1.25 MG (50000 UT) CAPS capsule Take 1 capsule (50,000 Units total) by mouth every Wednesday. 04/10/18   Hennie Duos, MD    Family History    Family History  Problem Relation Age of Onset  . Diabetes Mother   . Epilepsy Mother   . Cancer Mother        Breast  . Hypertension Mother   . Breast cancer Mother   . Seizures Mother   . Kidney disease Father   . Diabetes Father   . Hypertension Father   . Asthma Father   . Heart disease Father   . Epilepsy Sister   . Cancer Maternal Grandmother   . Breast cancer Maternal Grandmother   . Cancer Paternal Grandmother   . Breast cancer Paternal Grandmother    She indicated that her mother is deceased. She indicated that her father is deceased. She indicated that three of her four sisters are alive. She indicated that her maternal grandmother is deceased. She indicated that her maternal grandfather is deceased. She indicated that her paternal grandmother is deceased. She indicated that her paternal grandfather is deceased. She indicated that all of her three children are alive.  Social History    Social History   Socioeconomic History  . Marital status: Married     Spouse name: Not on file  . Number of children: 3  . Years of education: Not on file  . Highest education level: Master's degree (e.g., MA, MS, MEng, MEd, MSW, MBA)  Occupational History  . Occupation: retired  Tobacco Use  . Smoking status: Never Smoker  . Smokeless tobacco: Never Used  Vaping Use  . Vaping Use: Never used  Substance and Sexual Activity  . Alcohol use: No    Comment: wine occ  . Drug use: No    Types: Oxycodone    Comment: perscribed  . Sexual activity: Not Currently    Birth control/protection: None  Other Topics Concern  . Not on file  Social History Narrative   Right handed   1 cup of caffeine per day        Social Determinants of Health   Financial Resource Strain: Not on file  Food Insecurity: Not on file  Transportation Needs: Not on file  Physical Activity: Not on file  Stress: Not on file  Social Connections: Not on file  Intimate Partner Violence: Not on file     Review of Systems    General:  No chills, fever, night sweats or weight changes.  Cardiovascular:  No chest pain, dyspnea on exertion, edema, orthopnea, palpitations, paroxysmal nocturnal dyspnea. Dermatological: No rash, lesions/masses Respiratory: No cough, dyspnea Urologic: No hematuria, dysuria Abdominal:   No nausea, vomiting, diarrhea, bright red blood per rectum, melena, or hematemesis Neurologic:  No visual changes, wkns, changes in mental status. All other systems reviewed and are otherwise negative except as noted above.  Physical Exam    VS:  BP 104/64 (BP Location: Left Arm, Patient Position: Sitting)   Pulse 60   Ht 5\' 7"  (1.702 m)  Wt 185 lb 12.8 oz (84.3 kg)   SpO2 96%   BMI 29.10 kg/m  , BMI Body mass index is 29.1 kg/m. GEN: Well nourished, well developed, in no acute distress. HEENT: normal. Neck: Supple, no JVD, carotid bruits, or masses. Cardiac: RRR, no murmurs, rubs, or gallops. No clubbing, cyanosis, edema.  Radials/DP/PT 2+ and equal bilaterally.   Respiratory:  Respirations regular and unlabored, clear to auscultation bilaterally. GI: Soft, nontender, nondistended, BS + x 4. MS: no deformity or atrophy. Skin: warm and dry, no rash. Neuro:  Strength and sensation are intact. Psych: Normal affect.  Accessory Clinical Findings    Recent Labs: 06/14/2019: B Natriuretic Peptide 141.0 06/16/2019: ALT 9 06/19/2019: Magnesium 2.0 12/12/2019: Hemoglobin 9.1; Platelets 209 01/08/2020: TSH 1.70 04/02/2020: BUN 24; Creatinine, Ser 1.18; Potassium 3.4; Pro B Natriuretic peptide (BNP) 186.0; Sodium 140   Recent Lipid Panel    Component Value Date/Time   CHOL 150 05/27/2018 0543   TRIG 50 05/27/2018 0543   HDL 62 05/27/2018 0543   CHOLHDL 2.4 05/27/2018 0543   VLDL 10 05/27/2018 0543   LDLCALC 78 05/27/2018 0543    ECG personally reviewed by me today-ventricular paced rhythm 60 bpm- No acute changes  Echocardiogram 05/27/2018 IMPRESSIONS  1. Study performed in atrial fibriallation. 2. The left ventricle has low normal systolic function, with an ejection fraction of 50-55%. The cavity size was normal. There is mildly increased left ventricular wall thickness. Left ventricular diastolic Doppler parameters are indeterminate. 3. Mild hypokinesis of the LV inferoseptum from midventricle to apex. Mild hypokinesis of the LV anteroseptum from base to apex. 4. Left atrial size was severely dilated. 5. Right atrial size was severely dilated. 6. The trivial pericardial effusion is posterior to the left ventricle. 7. The aortic valve was not well visualized. Aortic valve regurgitation is trivial by color flow Doppler. 8. No intracardiac thrombi or masses were visualized. No intracardiac source of emboli was noted, howevere image quality was suboptimal. 9. When compared to the prior study: Side by side comparison of images performed to 02/01/2018. LV function has not significantly changed, and regional wall motion abnormalities appear  unchanged. RVSP has decreased, however TR velocity may have been  incompletely assessed due to poor acoustic windows on today's exam. 10. The interatrial septum was not well visualized.  FINDINGS Left Ventricle: The left ventricle has low normal systolic function, with an ejection fraction of 50-55%. The cavity size was normal. There is mildly increased left ventricular wall thickness. Left ventricular diastolic Doppler parameters are  indeterminate. LV strain and biplane volumes performed but not reported due to interpreter judgement (atrial fibrillation, poor tracking). Right Ventricle: The right ventricle has mildly reduced systolic function. The cavity was not assessed. There is no increase in right ventricular wall thickness. Left Atrium: left atrial size was severely dilated  Echocardiogram 06/15/2019 IMPRESSIONS    1. Left ventricular ejection fraction, by estimation, is 60 to 65%. The  left ventricle has normal function. The left ventricle has no regional  wall motion abnormalities. Left ventricular diastolic parameters are  indeterminate.  2. Right ventricular systolic function is normal. The right ventricular  size is mildly enlarged. There is moderately elevated pulmonary artery  systolic pressure. The estimated right ventricular systolic pressure is  27.0 mmHg.  3. Left atrial size was moderately dilated.  4. Right atrial size was moderately dilated.  5. The mitral valve is normal in structure. Mild mitral valve  regurgitation. No evidence of mitral stenosis.  6. Tricuspid valve  regurgitation is moderate.  7. The aortic valve is tricuspid. Aortic valve regurgitation is trivial.  Mild aortic valve sclerosis is present, with no evidence of aortic valve  stenosis.  8. The inferior vena cava is normal in size with greater than 50%  respiratory variability, suggesting right atrial pressure of 3 mmHg.   Comparison(s): No significant change from prior study. Prior  images  reviewed side by side.  Assessment & Plan   1.  DOE/weakness-reports increased weakness and dyspnea with exertion over the last several weeks.  No changes in medication, denies increased heart rates, dizziness, PND and orthopnea.  He appears to be a combination of CHF, deconditioning, and morbid obesity. Repeat echocardiogram Heart healthy low-sodium diet Increase physical activity   Atrial fibrillation- EKG today ventricular paced rhythm 60 bpm Continue Eliquis, Avoid triggers caffeine, chocolate, EtOH, dehydration etc. Her healthy low-sodium diet Increase physical activity as tolerated  Chronic diastolic congestive heart failure- echocardiogram 05/27/2018 showed an LVEF of 50 to 55% with mild LVH, intermediate left ventricular diastolic parameters.  Severely dilated left and right atria and trivial aortic valve regurgitation. Daily weights-contact office with a weight increase of 3 pounds overnight or 5 pounds in 1 week  Morbid obesity- failure of 3 gastric bypass procedures-weight today  185  pounds 12 ounces. Continue heart healthy diet Increase physical activity as tolerated  Essential hypertension-BP today  104/64. Well-controlled at home Heart healthy low-sodium diet Increase physical activity as tolerated  Disposition: Follow-up with Dr. [name] as scheduled.   [name]. Dauntae Derusha NP-C    04/07/2020, 5:05 PM [facility] [place] [address] Office [phone] Fax [phone]  Notice: This dictation was prepared with Dragon dictation along with smaller phrase technology. Any transcriptional errors that result from this process are unintentional and may not be corrected upon review.  I spent 15 minutes examining this patient, reviewing medications, and using patient centered shared decision making involving her cardiac care.  Prior to her visit I spent greater than 20 minutes reviewing her past medical history,  medications, and prior  cardiac tests.

## 2020-04-06 NOTE — Assessment & Plan Note (Signed)
Recent labs ok , patient request iron panel  Keep follow up with hematology .

## 2020-04-07 ENCOUNTER — Other Ambulatory Visit: Payer: Self-pay

## 2020-04-07 ENCOUNTER — Encounter: Payer: Self-pay | Admitting: General Practice

## 2020-04-07 ENCOUNTER — Ambulatory Visit (INDEPENDENT_AMBULATORY_CARE_PROVIDER_SITE_OTHER): Payer: Medicare Other | Admitting: General Practice

## 2020-04-07 VITALS — BP 104/64 | HR 60 | Ht 67.0 in | Wt 185.8 lb

## 2020-04-07 DIAGNOSIS — I5032 Chronic diastolic (congestive) heart failure: Secondary | ICD-10-CM

## 2020-04-07 DIAGNOSIS — R531 Weakness: Secondary | ICD-10-CM | POA: Diagnosis not present

## 2020-04-07 DIAGNOSIS — E876 Hypokalemia: Secondary | ICD-10-CM | POA: Diagnosis not present

## 2020-04-07 DIAGNOSIS — Z950 Presence of cardiac pacemaker: Secondary | ICD-10-CM | POA: Diagnosis not present

## 2020-04-07 DIAGNOSIS — E162 Hypoglycemia, unspecified: Secondary | ICD-10-CM | POA: Diagnosis not present

## 2020-04-07 DIAGNOSIS — R0600 Dyspnea, unspecified: Secondary | ICD-10-CM

## 2020-04-07 DIAGNOSIS — R0609 Other forms of dyspnea: Secondary | ICD-10-CM

## 2020-04-07 DIAGNOSIS — Z79899 Other long term (current) drug therapy: Secondary | ICD-10-CM

## 2020-04-07 DIAGNOSIS — R0902 Hypoxemia: Secondary | ICD-10-CM | POA: Diagnosis not present

## 2020-04-07 DIAGNOSIS — F5101 Primary insomnia: Secondary | ICD-10-CM | POA: Diagnosis not present

## 2020-04-07 DIAGNOSIS — R6881 Early satiety: Secondary | ICD-10-CM | POA: Diagnosis not present

## 2020-04-07 MED ORDER — POTASSIUM CHLORIDE CRYS ER 20 MEQ PO TBCR: 20.0000 meq | EXTENDED_RELEASE_TABLET | Freq: Every day | ORAL | 0 refills | Status: DC

## 2020-04-07 NOTE — Progress Notes (Signed)
Reviewed and agree with assessment/plan.   Vineet Sood, MD [facility] 04/07/2020, 11:02 AM Pager:  [phone]  

## 2020-04-07 NOTE — Patient Instructions (Signed)
Medication Instructions:  TAKE POTASSIUM 20MEQ DAILY X3 days then stop *If you need a refill on your cardiac medications before your next appointment, please call your pharmacy*  Lab Work: BMET IN 1 WEEK 04-14-2020 If you have labs (blood work) drawn today and your tests are completely normal, you will receive your results only by:  [place] (if you have MyChart) OR A paper copy in the mail.  If you have any lab test that is abnormal or we need to change your treatment, we will call you to review the results. You may go to any [facility] that is convenient for you however, we do have a lab in our office that is able to assist you. You DO NOT need an appointment for our lab. The lab is open 8:00am and closes at 4:00pm. Lunch 12:45 - 1:45pm.  Testing/Procedures: Echocardiogram - Your physician has requested that you have an echocardiogram. Echocardiography is a painless test that uses sound waves to create images of your heart. It provides your doctor with information about the size and shape of your heart and how well your heart's chambers and valves are working. This procedure takes approximately one hour. There are no restrictions for this procedure. This will be performed at our [facility] location - [address].  Special Instructions PLEASE READ AND FOLLOW SALTY 6-ATTACHED-1,800 mg daily  PLEASE INCREASE PHYSICAL ACTIVITY AS TOLERATED  Follow-Up: Your next appointment:  KEEP SCHEDULED APPOINTMENT  In Person with K. Mali Hilty, MD   At [facility], you and your health needs are our priority.  As part of our continuing mission to provide you with exceptional heart care, we have created designated Provider Care Teams.  These Care Teams include your primary Cardiologist (physician) and Advanced Practice Providers (APPs -  Physician Assistants and Nurse Practitioners) who all work together to provide you with the care you need, when you need it.

## 2020-04-08 ENCOUNTER — Other Ambulatory Visit: Payer: Self-pay | Admitting: Internal Medicine

## 2020-04-08 DIAGNOSIS — R0609 Other forms of dyspnea: Secondary | ICD-10-CM

## 2020-04-09 ENCOUNTER — Other Ambulatory Visit: Payer: Self-pay

## 2020-04-09 ENCOUNTER — Other Ambulatory Visit (HOSPITAL_COMMUNITY): Payer: Self-pay | Admitting: Internal Medicine

## 2020-04-09 ENCOUNTER — Ambulatory Visit (HOSPITAL_COMMUNITY)
Admission: RE | Admit: 2020-04-09 | Discharge: 2020-04-09 | Disposition: A | Discharge status: Home / Self Care | Payer: Medicare Other | Source: Ambulatory Visit | Attending: Internal Medicine | Admitting: Internal Medicine

## 2020-04-09 ENCOUNTER — Encounter (HOSPITAL_COMMUNITY): Payer: Self-pay

## 2020-04-09 DIAGNOSIS — R0609 Other forms of dyspnea: Secondary | ICD-10-CM

## 2020-04-09 MED ORDER — IOHEXOL 350 MG/ML SOLN
100.0000 mL | Freq: Once | INTRAVENOUS | Status: AC | PRN
  Administered 2020-04-09: 100 mL via INTRAVENOUS

## 2020-04-10 ENCOUNTER — Ambulatory Visit (HOSPITAL_COMMUNITY)
Admission: RE | Admit: 2020-04-10 | Discharge: 2020-04-10 | Disposition: A | Discharge status: Home / Self Care | Payer: Medicare Other | Source: Ambulatory Visit | Attending: Internal Medicine | Admitting: Internal Medicine

## 2020-04-10 ENCOUNTER — Encounter (HOSPITAL_COMMUNITY): Payer: Self-pay

## 2020-04-10 DIAGNOSIS — R079 Chest pain, unspecified: Secondary | ICD-10-CM | POA: Diagnosis not present

## 2020-04-10 DIAGNOSIS — J90 Pleural effusion, not elsewhere classified: Secondary | ICD-10-CM | POA: Diagnosis not present

## 2020-04-10 DIAGNOSIS — R0609 Other forms of dyspnea: Secondary | ICD-10-CM | POA: Diagnosis not present

## 2020-04-10 DIAGNOSIS — R0602 Shortness of breath: Secondary | ICD-10-CM | POA: Diagnosis not present

## 2020-04-10 DIAGNOSIS — I517 Cardiomegaly: Secondary | ICD-10-CM | POA: Diagnosis not present

## 2020-04-10 HISTORY — DX: Unspecified asthma, uncomplicated: J45.909

## 2020-04-10 HISTORY — DX: Malignant (primary) neoplasm, unspecified: C80.1

## 2020-04-10 MED ORDER — IOHEXOL 350 MG/ML SOLN
100.0000 mL | Freq: Once | INTRAVENOUS | Status: AC | PRN
  Administered 2020-04-10: 100 mL via INTRAVENOUS

## 2020-04-10 NOTE — Progress Notes (Signed)
Tried to call report to physician, no response.  Phone would just ring and ring. Unable to leave message

## 2020-04-10 NOTE — Progress Notes (Signed)
Doctor called for CT report at 12:18pm

## 2020-04-11 ENCOUNTER — Other Ambulatory Visit: Payer: Self-pay | Admitting: Internal Medicine

## 2020-04-13 DIAGNOSIS — M5414 Radiculopathy, thoracic region: Secondary | ICD-10-CM | POA: Diagnosis not present

## 2020-04-14 ENCOUNTER — Telehealth: Payer: Self-pay

## 2020-04-14 ENCOUNTER — Telehealth: Payer: Self-pay | Admitting: Physician Assistant

## 2020-04-14 DIAGNOSIS — R531 Weakness: Secondary | ICD-10-CM | POA: Diagnosis not present

## 2020-04-14 DIAGNOSIS — I5032 Chronic diastolic (congestive) heart failure: Secondary | ICD-10-CM | POA: Diagnosis not present

## 2020-04-14 DIAGNOSIS — I272 Pulmonary hypertension, unspecified: Secondary | ICD-10-CM | POA: Diagnosis not present

## 2020-04-14 DIAGNOSIS — R0600 Dyspnea, unspecified: Secondary | ICD-10-CM | POA: Diagnosis not present

## 2020-04-14 DIAGNOSIS — K911 Postgastric surgery syndromes: Secondary | ICD-10-CM | POA: Diagnosis not present

## 2020-04-14 DIAGNOSIS — R0609 Other forms of dyspnea: Secondary | ICD-10-CM | POA: Diagnosis not present

## 2020-04-14 DIAGNOSIS — R630 Anorexia: Secondary | ICD-10-CM | POA: Diagnosis not present

## 2020-04-14 DIAGNOSIS — Z950 Presence of cardiac pacemaker: Secondary | ICD-10-CM | POA: Diagnosis not present

## 2020-04-14 DIAGNOSIS — Z79899 Other long term (current) drug therapy: Secondary | ICD-10-CM | POA: Diagnosis not present

## 2020-04-14 LAB — BASIC METABOLIC PANEL
BUN/Creatinine Ratio: 20 (ref 12–28)
BUN: 24 mg/dL (ref 8–27)
CO2: 19 mmol/L — ABNORMAL LOW (ref 20–29)
Calcium: 6.6 mg/dL — CL (ref 8.7–10.3)
Chloride: 104 mmol/L (ref 96–106)
Creatinine, Ser: 1.19 mg/dL — ABNORMAL HIGH (ref 0.57–1.00)
GFR calc Af Amer: 55 mL/min/{1.73_m2} — ABNORMAL LOW (ref >59)
GFR calc non Af Amer: 47 mL/min/{1.73_m2} — ABNORMAL LOW (ref >59)
Glucose: 68 mg/dL (ref 65–99)
Potassium: 4.2 mmol/L (ref 3.5–5.2)
Sodium: 138 mmol/L (ref 134–144)

## 2020-04-14 NOTE — Telephone Encounter (Signed)
It was calcium that was low at 6.6.

## 2020-04-14 NOTE — Telephone Encounter (Addendum)
Paged by [facility] for low calcium of 6.6.  Patient with history of low calcium.  It was 6.0 on February 11.  This is managed by endocrinologist who has already increase her Calcitrol to twice daily and also other calcium supplement which is not listed on current medication.  Advised to continue to take current medications and increase calcium supplement in diet.  She will review lab with endocrinologist tomorrow.

## 2020-04-14 NOTE — Telephone Encounter (Signed)
REFERRAL FOR CT PLACED IN SCHEDULING BOX 

## 2020-04-15 ENCOUNTER — Other Ambulatory Visit: Payer: Self-pay | Admitting: Internal Medicine

## 2020-04-16 ENCOUNTER — Encounter (HOSPITAL_COMMUNITY): Payer: Self-pay | Admitting: Gastroenterology

## 2020-04-16 ENCOUNTER — Other Ambulatory Visit: Payer: Self-pay

## 2020-04-19 ENCOUNTER — Other Ambulatory Visit: Payer: Self-pay

## 2020-04-19 ENCOUNTER — Ambulatory Visit (INDEPENDENT_AMBULATORY_CARE_PROVIDER_SITE_OTHER): Payer: Medicare Other | Admitting: Internal Medicine

## 2020-04-19 ENCOUNTER — Encounter: Payer: Self-pay | Admitting: Internal Medicine

## 2020-04-19 VITALS — BP 135/74 | HR 74 | Ht 67.0 in | Wt 171.8 lb

## 2020-04-19 DIAGNOSIS — I4891 Unspecified atrial fibrillation: Secondary | ICD-10-CM

## 2020-04-19 DIAGNOSIS — R0600 Dyspnea, unspecified: Secondary | ICD-10-CM | POA: Diagnosis not present

## 2020-04-19 DIAGNOSIS — I428 Other cardiomyopathies: Secondary | ICD-10-CM

## 2020-04-19 DIAGNOSIS — R634 Abnormal weight loss: Secondary | ICD-10-CM

## 2020-04-19 DIAGNOSIS — R0609 Other forms of dyspnea: Secondary | ICD-10-CM

## 2020-04-19 DIAGNOSIS — Z9889 Other specified postprocedural states: Secondary | ICD-10-CM

## 2020-04-19 DIAGNOSIS — I5032 Chronic diastolic (congestive) heart failure: Secondary | ICD-10-CM

## 2020-04-19 NOTE — Progress Notes (Signed)
OFFICE NOTE  Chief Complaint:  Follow-up dyspnea  Primary Care Physician: [name], MD  HPI:  Jillian Eaton is a 70 year old female I saw a few weeks ago with a history of super morbid obesity, fibromyalgia and increasing shortness of breath. She had a cath in 2012 which showed normal coronaries, mildly elevating filling pressures, and diastolic pressures with a mean PA of 31. This correlated with her echocardiogram when RV systolic pressures in 5397 were 49 on echo. A repeat echocardiogram was just performed which demonstrated a preserved LVEF of 60-65% with moderate concentric LVH. There was mild to moderate increase in pulmonary artery pressure with peak at 51, which has not significantly changed from her study 1 year ago. Although the pressures look very similar her shortness of breath has been increasing significantly, and I recommended that she wear oxygen at night since she had that at home which does seem to be helping her at least feel better during the day as I suspect she has sleep apnea. She has been tested before which was apparently negative and is considering retesting at some point in the future. Overall I think the main issue obviously is weight, and she unfortunately says that she is not a candidate for gastric bypass and therefore it is a very difficult situation.  I referred her back to her pulmonologist in [facility] for ongoing evaluation of pulmonary hypertension. She tells me that she was then referred to [facility] and saw a specialist there who did another right heart catheterization but did not recommend any medications other than better blood pressure control.   In addition she had problems with kidney stones and underwent 2 operations regarding tthis.  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  She is now dependent on thyroid medication. Unfortunately she's not been able to lose weight, but her weight is fairly  stable around 400 pounds as is her shortness of breath.  Cath in 2012:  LEFT HEART CATHETERIZATION  OPERATOR: Mali Hilty, MD, and Rolland Porter, MD  INDICATION: Dyspnea on exertion and chest pressure.  HISTORY OF PRESENT ILLNESS: Jillian Eaton is a morbidly obese (BMI greater than 63) female with a history of failed gastric bypass x2 with an increasing weight gain and increasing shortness of breath as well as new- onset dyspnea on exertion. She reports that she can only walk about 10 feet before she becomes short of breath and has chest pressure which she says get better with rest. She has numerous cardiac risk factors and given the high likelihood of false positive stress test, I have referred her for cardiac catheterization, both left and right heart as she has had elevated RVSP on echocardiogram of approximately 30 to 31 mmHg.  PROCEDURE: The patient was brought into the cardiac catheterization lab, sterilely prepped and draped in the usual fashion. The area around the right femoral artery and right brachial vein were cleansed and draped to allow an attempt at radial arterial and brachiocephalic venous access. IV was not able to be obtained prior to the procedure given her body habitus. With difficulty in assessing the vein, the ultrasound was eventually used to identify the right brachiocephalic vein, however, cannulation with needle and wire was not possible as the vein was very small in caliber. After mild local bleeding was controlled, we did turn our attention to the right femoral vein and with great difficulty using the ultrasound, the right femoral vein was accessed by Dr.  Ellyn Hack using a straight wire and a needle. After venous access was obtained, the attention was turned to the right radial artery by Dr. Ellyn Hack and simultaneous to that right heart catheterization was performed by myself without any difficulty. The right radial artery was  successfully cannulated and subsequently left coronary artery system was selectively injected with a 5-French TIG 4.0 catheter, however the right coronary artery could not be cannulated with the TIG catheter and was eventually cannulated with a JR-4 catheter. LV pressure was measured with a pigtail catheter. Estimated blood loss was about 30 mL. There were no acute complications. The patient received a total of 9 mg of Versed throughout the procedure as well as 225 mcg of fentanyl and was at no point greater than moderately sedated. She received 5000 units of heparin about 10 mL of a radial cocktail.  FINDINGS: 1. Left main - short, no disease. 2. LAD - no significant disease. 3. Left circumflex, no significant disease. 4. RCA - dominant, no disease, large-caliber vessel. 5. LVEDP = 20 mmHg. 6. RA - 12. 7. RV 38/12. 8. PA - 43/19 (31). 9. PCWP - 24. 10.TPG - 7. 11.Fick cardiac output/Fick cardiac index - 10.56/3.84. 12.Thermodilution cardiac output/thermodilution cardiac index -  6.78/2.47. 13.Aortic saturation - 94%. 14.PA saturation - 68%.  IMPRESSION: 1. No significant obstructive coronary artery disease. 2. LVEDP = 20 mmHg. 3. Borderline pulmonary venous hypertension. 4. High cardiac output.  [name] returns today for follow-up. She reports that her shortness of breath has not significantly worsened, in fact, possibly is slightly better. She did have thyroid surgery last year at [facility] and apparently underwent right heart catheterization prior to that. I do not have those records immediately available. Unfortunately she continues to maintain her weight and has not been able to lose any. She is complaining of some numbness and tingling in her feet which is likely neuropathy. She is on medication including Lyrica which she takes for fibromyalgia.  Jillian Eaton returns today for follow-up. She recently is been having more shortness of breath  and lower extremity swelling. She pointed out edema in her legs with very small blisters and some chronic venous stasis changes. She is not currently on a diuretic. She recently saw another new primary care provider with the [facility]. She was noted to be started on lisinopril 40 mg daily, which she is taking in addition to losartan 100 mg daily. The notes do not indicate from her office visit why she was started on lisinopril, but I can see through care everywhere that this was ordered by her primary care provider. She also takes amlodipine for blood pressure control. Her blood pressure was elevated initially at 178/94, but after resting came down to 120/78 and is at goal today. It is unusual, however to use both ACE inhibitors and ARBs.  07/02/2015  Jillian Eaton returns today for follow-up. She underwent a repeat echocardiogram for progressive dyspnea and exertion which showed a preserved LVEF of 6065% however there is moderate to severe pulmonary hypertension with an RVSP of 64 mmHg. This is increased about 10 mmHg compared to her prior study. Her mean pulmonary pressure by catheterization in 2012 was only 31 mmHg. I suspect that progressive pulmonary hypertension as a cause of her worsening shortness of breath. In fact, during recent hospitalization for surgery she required discharged with oxygen. She says she rarely uses oxygen at home but notes that she is hypoxic often when she checks her oxygen  levels. At her last office visit I recommended Lasix which she has been using sparingly. She has problems with incontinence and she does report an improvement in her swelling with it but does not notice significant change in her shortness of breath.  06/06/2016  Jillian Eaton returns from hospital follow-up. She was just admitted after an admission in January for unintentional narcotic overdose. She had unresponsiveness and possible aspiration. There was a second admission for a similar presentation  with respiratory failure, fever, sepsis and ultimately she was found to have a new onset cardiomyopathy with EF as low as 25%. Was felt that this was nonischemic. She was having intermittent atrial tachycardia and was felt that this might need to be managed with antiarrhythmic therapy however it seems to have resolved with carvedilol. She was placed on diuretics and reports that her breathing is close to baseline. She remains hypoxic with an O2 saturation 93% however was on home oxygen is result of her severe pulmonary hypertension. She does not feel that she needs to use the oxygen very regularly. From a heart failure standpoint she is on carvedilol, aspirin, furosemide and not currently on an ACE inhibitor, ARB or Entresto. Recent testing a renal function shows normal creatinine.  10/11/2016  Jillian Eaton is seen today in follow-up. In July she was hospitalized for hyperkalemia. She is on supplemental potassium and lisinopril, both were stopped. Fortunately her echo has improved back to normal recently. Overall she feels well. Her weight is down about 40 pounds of the recently she gained about 20 pounds back. She is working on that right as we speak.  04/03/2017  Jillian Eaton was seen today in follow-up.  She recently called in and had complaints of worsening shortness of breath.  She saw her pulmonary doctor who did an x-ray noted a mild right upper lobe infiltrate versus edema.  Labs were obtained including BNP which was only mildly elevated at 110.  She was given 20 mg of Lasix with no benefit and then recently was advised over the phone to increase her Lasix to 40 mg for 2 days.  She urinated quite a bit and lost about 5 pounds of what she feels was water weight.  She reports mild improvement in her breathing.  She is quite anxious about what might have led to this fluid gain.  Given her history of cardiomyopathy, it is possible she could have had some recurrent cardiomyopathy.  She reports stable diet and  no worsening abuse of sodium.  06/19/2017  Jillian Eaton returns today for follow-up.  She was supposed to have a limited echocardiogram but that never happened.  Approximately 2 to 3 weeks ago she called the office reporting palpitations.  We try to schedule an earlier appointment, but she ended up going to [facility] her [facility].  On the flight she apparently became short of breath had some left flank pain and was hypoxic.  She was given oxygen and after landing they took her to the [facility] in [place].  She was treated there and found to be in A. fib with RVR.  This is a new finding.  She had a remote history of either PAT or PAF, but so brief that she was not anticoagulated.  She was then started on Xarelto.  She tells me she was given Lasix and diuresed.  She returns today for follow-up and still reports some shortness of breath.  Weight is about 6 pounds heavier than she was 2 months ago.  She is in A. fib persistently with heart rates in the low 100s.  In addition she reports after starting the Xarelto that she has been having some vaginal bleeding.  07/26/2017  Jillian Eaton returns today for follow-up.  She was recently seen in the hospital and discharged on 07/17/2017.  Today is a transition of care follow-up.  She was contacted on the day after discharge and felt to be doing well.  She is currently in rehabilitation.  She says she has had marked improvement in her shortness of breath.  She feels like her edema has improved.  She has decreased her dose of daily Lasix from 80 mg to 40 mg.  She is in persistent atrial fibrillation however rate controlled on amiodarone 400 mg which she is taking twice daily.  She reports she is getting stronger and hopefully will be out of rehabilitation within the next week.  She did miss 1 dose of Eliquis due to a transfer issue on May 28 and therefore will need 3 weeks of subsequent uninterrupted anticoagulation prior to attempted  cardioversion.  09/13/2017  Jillian Eaton returns today for follow-up of cardioversion.  Unfortunately this was unsuccessful after 3 shocks.  She told me there is some confusion afterwards that she forgot to take her amiodarone.  She was taking it up until the cardioversion.  Since it was unsuccessful, I recommended rate control strategy.  At this point there is little benefit from being on amiodarone and the side effect profile would not make it favorable.  I would recommend just continuing carvedilol for rate control.  She is had significant weight loss now down to 313 from 340.  This will continue to help her symptoms.  Her shortness of breath has improved.  She has had no significant worsening edema.  No further significant GU bleeding.  09/28/2017  Jillian Eaton returns today for follow-up of multiple ER visits.  I last saw her a couple weeks ago and unfortunately she was maintaining A. fib after unsuccessful cardioversion.  She continues to lose weight.  In fact she is down to 306 pounds today from 313.  Lab work indicates a rising creatinine and very low BNP, which makes me feel that she may be over diuresed.  She is also noted to be anemic with iron deficiency and is anticoagulated on Eliquis.  Her PCP is likely to start her on iron.  She denies any blood in the stool however stool guaiacs or work-up for microscopic iron is indicated.  She reports intermittent hematuria which is been minor.  Blood pressure is noted to be low today 94/69.  She is on low-dose carvedilol for rate control as well as hydrochlorothiazide and low-dose Lasix.  Recently she said she has had improvement in her breathing and for the first time the other day was able to sleep without oxygen.  10/12/2017  Jillian Eaton was seen again today in follow-up.  She called the office that she noted to have significant weight gain.  In July she was 313 and had lost weight down to 297.  Subsequently was found out that she was possibly overtreated  with her thyroid medication and her dose was decreased from 300 mcg to 200 mcg daily by her endocrinologist.  Since then she has gained weight but she has been up 12 pounds over the past 2 weeks.  She does report some edema.  I had stopped her hydrochlorothiazide due to hypotension and blood pressure is better today 112/70.  She takes low-dose  Lasix 20 mg daily.  She also reported that she now has stage III chronic kidney disease.  12/13/2017  Jillian Eaton returns today for follow-up.  She is done well and felt very stable.  Although her weight is up about 10 pounds she denies any worsening fluid.  She remains on 20 mg Lasix daily.  She is been working with her endocrinologist who reduced her levothyroxine.  Some of the weight gain may be related to that as she was on a very high dose of 300 mcg daily.  She denies any worsening shortness of breath.  She is having problems with her right shoulder and is scheduled to go arthroplasty by Dr. Alma Friendly.  She is here today for preoperative risk assessment.  She denies any chest pain.  She is on Eliquis for A. fib which is persistent but rate control.  She would have to stop this typically about 3 days prior to the procedure.  01/15/2018  Jillian Eaton is seen today for follow-up of recent surgery.  Actually she did not have surgery rather was induced by anesthesia and then had significant hypotension and instability requiring IV fluids and pressors.  Based on that response anesthesia recommended that the surgery be canceled.  I spoke with Dr. Alma Friendly subsequent to that about the episode and agreed to see her back to help determine why she may have had such a response.  She actually had significant improvement in LV function by echo 6 months ago back to normal.  She has not had recurrent A. fib and has been euvolemic.  She is on a diuretic.  Weight has been going back up however this is been related to changing her thyroid medications as she was iatrogenically hyperthyroid.   Stress testing was not pursued because she has a history of normal coronaries by cath in 2012 and since her EF improved recently with medical therapy alone, this would have been unlikely if she had significant coronary disease.  I suspect that her hypotension was due to being volume depleted and that she had a marked response to the propofol leading to her hypotension.  06/04/2018  Jillian Eaton was seen today for hospital follow-up as a transition of care 7 visit.  Unfortunately she was admitted with confusion and seizure.  She was started on Keppra.  She did recover from this but was noted to be in A. fib with RVR.  Her carvedilol was switched to Toprol-XL 100 mg daily.  As she had hypotension, her diuretic was discontinued.  Today she reports her weight is even lower.  She denies any new swelling.  She has had some confusion and feels hot and chilled at times.  We had difficulty getting her blood pressure today however I believe it is between 90 and 588 systolic.  This is consistent with her discharge blood pressure readings.  She is concerned she may be on too much metoprolol.  09/09/2018  Jillian Eaton is seen today in follow-up.  Overall she is doing well.  In fact she is lost more weight.  She is now 249 pounds.  She is trying to lose enough weight where she might be a candidate for surgery.  She is not had any further seizure activity.  She does have follow-up with her neurologist tomorrow who may adjust her antiepileptic medications.  I had decreased her beta-blocker because of hypotension.  Blood pressure was actually not able to be obtained today.  She says at home generally runs around 502 systolic.  She  is not had any presyncopal episodes related to this.  Overall she feels well.  She is actually expressed some interest in exertion and perhaps pulmonary rehabilitation for diastolic heart failure.  She generally has NYHA class II-III symptoms.  12/05/2018  Jillian Eaton is seen back today in follow-up.  In  the interim she is [name], NP, is working with her for shortness of breath.  Some additional testing was performed that showed some anemia.  Was also thought may be there was some congestive heart failure.  She was then seen in the ER with concerns about possible DVT/PE.  Work-up there was negative and the chest x-ray suggested the possibility of a pericardial effusion and further testing was recommended.  BNP was close to 300 but not significantly elevated.  Subsequent to that she had started taking Lasix now on a daily basis.  Her last echo showed a low normal LVEF of 50 to 55% with trivial pericardial fluid back in April 2020.  She remains in persistent atrial fibrillation which is rate controlled.  01/24/2019  Jillian Eaton is seen today for follow-up of her dyspnea.  She continues to have shortness of breath.  She is scheduled to have another knee replacement surgery coming up.  She is also ordered for pulmonary function testing and does have a pulmonary follow-up in January.  I suspect there may be a pulmonary issue.  Her weight is gone up about 10 pounds as well however she feels that this may be fluid related.  She is also noted increased lower extremity swelling.  She has been using Lasix 40 mg as needed but not regularly.  I think she may need to be on daily diuretic.  04/02/2019  Ms. Lasch returns today for follow-up.  She reports her dyspnea has improved.  She was thought to have moderate persistent asthma and her symptoms have improved with adjustment in her inhalers.  She also was found to be mildly volume overloaded on increased her diuretics.  Finally she was iatrogenically hyperthyroid.  This was corrected as well.  She also had some mild anemia which was improved with iron.  Overall the combination of these factors made her short of breath and somewhat fatigued.  She ultimately did not undergo her knee surgery but plans to reschedule in the near future.  From a cardiac standpoint she is  at acceptable risk for this.  07/11/2019  Ms. Brines returns today for follow-up.  She continues to have some persistent dyspnea.  She was recently hospitalized and with difficult to control atrial fibrillation and ultimately underwent AV node ablation and permanent pacemaker placement.  Since then she has had better heart rate control with some mild improvement in her dyspnea but reports is still not to the level that she expects.  She has been seen by pulmonary as well I do not feel like there is ongoing reason for her dyspnea other than possibly pulmonary hypertension.  A previous echo had shown mean pulmonary pressure around 50.  She could have a component of exercise-induced pulmonary hypertension that is more severe.  Her weight has finally stabilized around 200 pounds.  10/16/2019  Ms. Greenwell is seen today in follow-up.  She is doing quite well at this point.  She had recently seen Dr. Lovena Le who readjusted her pacemaker.  She has had some other medication changes and seems to have had improvement in her dyspnea.  She was found to be anemic and underwent iron therapy.  That has improved her  dyspnea somewhat.  Weight seems to have stabilized somewhere around 192 pounds although lower than it had been previously.  For really the first time that I can remember to while she says she feels well.  She denies any bleeding issues on Eliquis although there is a concern for chronic blood loss anemia.  04/19/2020  Ms. Mount returns today for follow-up.  She had recently had some worsening dyspnea and fatigue.  She was seen by [name], NP on 04/07/2020 and felt to have some volume overload.  She was given a trial of increased diuretics which was helpful.  She had a CT scan performed at [facility] on April 10, 2020 which showed bilateral pleural effusions and pulmonary hypertension but no pulmonary emboli.  No masses in the chest were noted.  Surgical changes were noted at the GE junction, related to prior  gastric bypass surgery.  She has had continued unexpected weight loss.  She has had significant anorexia and nausea and vomiting as well as food getting stuck.  She has an appointment on Friday for an endoscopy by Dr. Almyra Free.  Weight is now down 20 pounds since I saw her last August.  She is complaining of some discomfort around the area where her pacemaker is probably related to her significant weight loss.  She says she can now feel her rib cage which she can never feel in her life before.  A BNP was performed on 04/02/2020 which was 186, lower than it has been in a long time.  She does have a repeat echo that has been moved up to March 11.  PMHx:  Past Medical History:  Diagnosis Date  . Anemia    iron and pernicious  . Arthritis   . Asthma   . Cancer ([place])   . CHF (congestive heart failure) ([place]) 2018  . Complication of anesthesia    BP dropped   . DOE (dyspnea on exertion)    2D ECHO, 02/12/2012 - EF 60-65%, moderate concentric hypertrophy  . Dyspnea   . Dysrhythmia    a-fib  . Fibromyalgia    nerve pain"left side at waist level" "can't lay on that side without pain" , "HOB elevation helps"; pt. thinks this has resolved after 200lb weight loss9-17- since i lost the wieght , i dont  think i have this anymore   . Headache 04/2019  . Heart murmur   . Hematuria - cause not known    resolved   . History of COVID-19   . History of kidney stones    x 2 '13, '14 surgery to remove  . Hypertension   . Pneumonia    aspiration  04/2016  . Presence of permanent cardiac pacemaker   . SBO (small bowel obstruction) ([place])    rqueired admission 2020  . Thyroid disease    "goiter"  . Transfusion history    10 yrs+    Past Surgical History:  Procedure Laterality Date  . ABDOMINAL ADHESION SURGERY  2004   open LOA - in [place]  . AV NODE ABLATION N/A 06/20/2019   Procedure: AV NODE ABLATION;  Surgeon: Evans Lance, MD;  Location: [facility];  Service: Cardiovascular;  Laterality: N/A;   . BALLOON DILATION N/A 07/25/2019   Procedure: BALLOON DILATION;  Surgeon: Carol Ada, MD;  Location: [facility];  Service: Endoscopy;  Laterality: N/A;  . CARDIAC CATHETERIZATION  04/04/2010   No significant obstructive coronary artery disease  . CARDIOVERSION N/A 08/08/2017   Procedure:  CARDIOVERSION;  Surgeon: Pixie Casino, MD;  Location: [facility] ENDOSCOPY;  Service: Cardiovascular;  Laterality: N/A;  . CARDIOVERSION N/A 06/18/2019   Procedure: CARDIOVERSION;  Surgeon: Pixie Casino, MD;  Location: [facility];  Service: Cardiovascular;  Laterality: N/A;  . CHOLECYSTECTOMY  1990  . COLONOSCOPY W/ POLYPECTOMY    . COLONOSCOPY WITH PROPOFOL N/A 04/10/2014   Procedure: COLONOSCOPY WITH PROPOFOL;  Surgeon: Beryle Beams, MD;  Location: [facility];  Service: Endoscopy;  Laterality: N/A;  . CYSTOSCOPY/URETEROSCOPY/HOLMIUM LASER/STENT PLACEMENT Right 08/13/2019   Procedure: CYSTOSCOPY/RETROGRADE/URETEROSCOPY/HOLMIUM LASER/STENT PLACEMENT;  Surgeon: Ceasar Mons, MD;  Location: [facility];  Service: Urology;  Laterality: Right;  ONLY NEEDS 45 MIN  . ESOPHAGOGASTRODUODENOSCOPY (EGD) WITH PROPOFOL N/A 07/25/2019   Procedure: ESOPHAGOGASTRODUODENOSCOPY (EGD) WITH PROPOFOL;  Surgeon: Carol Ada, MD;  Location: [facility];  Service: Endoscopy;  Laterality: N/A;  . EYE SURGERY     lasik 20-25 yrs ago  . GASTROPLASTY VERTICAL BANDED  1985  . [place]   In [place] for [facility]  . MALONEY DILATION  07/25/2019   Procedure: Venia Minks DILATION;  Surgeon: Carol Ada, MD;  Location: [facility];  Service: Endoscopy;;  . PACEMAKER IMPLANT N/A 06/20/2019   Procedure: PACEMAKER IMPLANT;  Surgeon: Evans Lance, MD;  Location: [facility];  Service: Cardiovascular;  Laterality: N/A;  . REVERSE SHOULDER ARTHROPLASTY Right 03/29/2018   Procedure: REVERSE SHOULDER ARTHROPLASTY;  Surgeon: Netta Cedars, MD;  Location: [facility];  Service: Orthopedics;   Laterality: Right;  . ROUX-EN-Y GASTRIC BYPASS  1992   Conversion VBG to RnYGB in [place]  . SHOULDER ARTHROSCOPY WITH SUBACROMIAL DECOMPRESSION  2016   Dr Berenice Primas, [place], [place]  . SMALL INTESTINE SURGERY  2000   [facility] - LOA w SB resection  . TOTAL KNEE ARTHROPLASTY Left 11/14/2018   Procedure: TOTAL KNEE ARTHROPLASTY;  Surgeon: Paralee Cancel, MD;  [facility]: [facility];  Service: Orthopedics;  Laterality: Left;  70 mins  . TOTAL KNEE ARTHROPLASTY Right 04/29/2019   Procedure: TOTAL KNEE ARTHROPLASTY;  Surgeon: Paralee Cancel, MD;  [facility];  Service: Orthopedics;  Laterality: Right;  70 mins  . TOTAL THYROIDECTOMY  06/26/2012   Dr Celine Ahr, [facility]  . TUBAL LIGATION  1986  . URETHRAL DILATION  2012   w cystoscopy.  Dr Gaynelle Arabian  . UTERINE FIBROID EMBOLIZATION      FAMHx:  Family History  Problem Relation Age of Onset  . Diabetes Mother   . Epilepsy Mother   . Cancer Mother        Breast  . Hypertension Mother   . Breast cancer Mother   . Seizures Mother   . Kidney disease Father   . Diabetes Father   . Hypertension Father   . Asthma Father   . Heart disease Father   . Epilepsy Sister   . Cancer Maternal Grandmother   . Breast cancer Maternal Grandmother   . Cancer Paternal Grandmother   . Breast cancer Paternal Grandmother     SOCHx:   reports that she has never smoked. She has never used smokeless tobacco. She reports that she does not drink alcohol and does not use drugs.  ALLERGIES:  Allergies  Allergen Reactions  . Hydrocodone Itching         ROS: Pertinent items noted in HPI and remainder of comprehensive ROS otherwise negative.  HOME MEDS: Current Outpatient Medications  Medication Sig Dispense Refill  . acetaminophen (TYLENOL) 650 MG CR tablet Take  1,300 mg by mouth daily.    Marland Kitchen albuterol (VENTOLIN HFA) 108 (90 Base) MCG/ACT inhaler Inhale 2 puffs into the lungs every 6 (six) hours as needed for wheezing or shortness of breath.    .  calcitRIOL (ROCALTROL) 0.25 MCG capsule Take 1 capsule (0.25 mcg total) by mouth daily. (Patient taking differently: Take 0.5 mcg by mouth daily.) 90 capsule 3  . cyanocobalamin (,VITAMIN B-12,) 1000 MCG/ML injection Inject 1 mL (1,000 mcg total) into the muscle every 30 (thirty) days. 1 mL 0  . cyclobenzaprine (FLEXERIL) 10 MG tablet Take 20 mg by mouth at bedtime.     Mariane Baumgarten Sodium (DSS) 100 MG CAPS Take 100 mg by mouth daily.    . DULoxetine (CYMBALTA) 60 MG capsule Take 1 capsule (60 mg total) by mouth daily. 30 capsule 0  . ELIQUIS 5 MG TABS tablet TAKE 1 TABLET TWICE A DAY (Patient taking differently: Take 5 mg by mouth 2 (two) times daily.) 180 tablet 1  . eszopiclone (LUNESTA) 2 MG TABS tablet Take 1 tablet (2 mg total) by mouth at bedtime. 20 tablet 0  . famotidine (PEPCID) 20 MG tablet Take 20 mg by mouth daily as needed for heartburn or indigestion.    Marland Kitchen FOLIC ACID PO Take 1 tablet by mouth daily.    . furosemide (LASIX) 20 MG tablet Take 20 mg by mouth daily as needed for edema.    Marland Kitchen levothyroxine (SYNTHROID) 137 MCG tablet TAKE 1 TABLET (137 MCG TOTAL) BY MOUTH DAILY BEFORE BREAKFAST. 90 tablet 1  . magnesium oxide (MAG-OX) 400 (241.3 Mg) MG tablet Take 400 mg by mouth daily.     . mirtazapine (REMERON) 7.5 MG tablet Take 7.5 mg by mouth at bedtime.    . montelukast (SINGULAIR) 10 MG tablet TAKE 1 TABLET BY MOUTH EVERYDAY AT BEDTIME (Patient taking differently: Take 10 mg by mouth at bedtime.) 90 tablet 0  . potassium chloride (KLOR-CON) 10 MEQ tablet Take 10 mEq by mouth daily.    . traMADol (ULTRAM) 50 MG tablet Take 50 mg by mouth 3 (three) times daily as needed for moderate pain.    . Vitamin D, Ergocalciferol, (DRISDOL) 1.25 MG (50000 UT) CAPS capsule Take 1 capsule (50,000 Units total) by mouth every Wednesday. 4 capsule 0   No current facility-administered medications for this visit.    LABS/IMAGING: No results found for this or any previous visit (from the past 48  hour(s)). No results found.  VITALS: BP 135/74   Pulse 74   Ht 5\' 7"  (1.702 m)   Wt 171 lb 12.8 oz (77.9 kg)   SpO2 95%   BMI 26.91 kg/m   EXAM: General appearance: alert and fatigued Neck: no carotid bruit and no JVD Lungs: diminished breath sounds bilaterally Heart: regular rate and rhythm Abdomen: soft, non-tender; bowel sounds normal; no masses,  no organomegaly Extremities: extremities normal, atraumatic, no cyanosis or edema Pulses: 2+ and symmetric Skin: Skin color, texture, turgor normal. No rashes or lesions Neurologic: Grossly normal Psych: Pleasant  EKG: Deferred  ASSESSMENT: 1. DOE-persists 2. Anesthesia-associated hypotension 3. A. fib with RVR-CHADSVASC score of 4 - s/p AVN ablation and PPM placement ([employer]) - pacer dependent 4. Chronic diastolic congestive heart failure-LVEF 25% (improved to 60-65% in 06/2017) 5. Super morbid obesity, with failure of 3 gastric bypass procedures -recent significant weight loss 6. Pulmonary hypertension - PA pressure of 64 mmHg, normal LV systolic function 7. Progressive DOE 8. Hypertension-controlled  9. Possible A. fib/ectopic atrial tachycardia-resolved  10. Seizures  PLAN: 1.   Ms. Hernandez has again developed some dyspnea which improved after her AV node ablation and pacemaker placement.  She recently found to have some bilateral pleural effusions and thought possibly some heart failure.  She was placed on more Lasix and BNP was low at 186.  She had COVID-19 in January although she said she thought it was a mild case.  She does have pulmonary hypertension.  A repeat echo has been ordered and her main issue though is I think with weight loss.  This is been tremendous and she is undergoing an endoscopy due to issues with difficulty swallowing, nausea and vomiting and early satiety, possibly related to prior gastric bypass surgeries.  Follow-up in 3 months.  Pixie Casino, MD, [facility], [place] Director of the [facility] Lipid [facility] &  Cardiovascular [facility] Reduction Clinic Diplomate of the American Board of Clinical Lipidology Attending Cardiologist  Direct Dial: [phone]  Fax: [phone]  Website:  [email or web address] 04/19/2020, 9:11 AM

## 2020-04-19 NOTE — Patient Instructions (Signed)
Medication Instructions:  Your physician recommends that you continue on your current medications as directed. Please refer to the Current Medication list given to you today.  *If you need a refill on your cardiac medications before your next appointment, please call your pharmacy*  Testing/Procedures: Echo as planned   Follow-Up: At [facility], you and your health needs are our priority.  As part of our continuing mission to provide you with exceptional heart care, we have created designated Provider Care Teams.  These Care Teams include your primary Cardiologist (physician) and Advanced Practice Providers (APPs -  Physician Assistants and Nurse Practitioners) who all work together to provide you with the care you need, when you need it.  We recommend signing up for the patient portal called "MyChart".  Sign up information is provided on this After Visit Summary.  MyChart is used to connect with patients for Virtual Visits (Telemedicine).  Patients are able to view lab/test results, encounter notes, upcoming appointments, etc.  Non-urgent messages can be sent to your provider as well.   To learn more about what you can do with MyChart, go to [email or web address].    Your next appointment:   3 month(s)  The format for your next appointment:   In Person  Provider:   You may see Pixie Casino, MD or one of the following Advanced Practice Providers on your designated Care Team:    Almyra Deforest, PA-C  Fabian Sharp, PA-C or   Roby Lofts, [place]    Other Instructions

## 2020-04-20 ENCOUNTER — Other Ambulatory Visit (HOSPITAL_COMMUNITY): Payer: Medicare Other

## 2020-04-21 ENCOUNTER — Other Ambulatory Visit: Payer: Self-pay

## 2020-04-21 ENCOUNTER — Emergency Department (HOSPITAL_COMMUNITY): Payer: Medicare Other

## 2020-04-21 ENCOUNTER — Telehealth: Payer: Self-pay | Admitting: Pulmonary Disease

## 2020-04-21 ENCOUNTER — Inpatient Hospital Stay (HOSPITAL_COMMUNITY)
Admission: EM | Admit: 2020-04-21 | Discharge: 2020-05-04 | DRG: 189 | Disposition: A | Discharge status: Home / Self Care | Payer: Medicare Other | Attending: Internal Medicine | Admitting: Internal Medicine

## 2020-04-21 DIAGNOSIS — E1122 Type 2 diabetes mellitus with diabetic chronic kidney disease: Secondary | ICD-10-CM | POA: Diagnosis present

## 2020-04-21 DIAGNOSIS — E878 Other disorders of electrolyte and fluid balance, not elsewhere classified: Secondary | ICD-10-CM | POA: Diagnosis present

## 2020-04-21 DIAGNOSIS — J9621 Acute and chronic respiratory failure with hypoxia: Secondary | ICD-10-CM | POA: Diagnosis not present

## 2020-04-21 DIAGNOSIS — R0602 Shortness of breath: Secondary | ICD-10-CM | POA: Diagnosis not present

## 2020-04-21 DIAGNOSIS — Z85850 Personal history of malignant neoplasm of thyroid: Secondary | ICD-10-CM

## 2020-04-21 DIAGNOSIS — I130 Hypertensive heart and chronic kidney disease with heart failure and stage 1 through stage 4 chronic kidney disease, or unspecified chronic kidney disease: Secondary | ICD-10-CM | POA: Diagnosis present

## 2020-04-21 DIAGNOSIS — E8809 Other disorders of plasma-protein metabolism, not elsewhere classified: Secondary | ICD-10-CM | POA: Diagnosis present

## 2020-04-21 DIAGNOSIS — I442 Atrioventricular block, complete: Secondary | ICD-10-CM | POA: Diagnosis present

## 2020-04-21 DIAGNOSIS — Z825 Family history of asthma and other chronic lower respiratory diseases: Secondary | ICD-10-CM

## 2020-04-21 DIAGNOSIS — E539 Vitamin B deficiency, unspecified: Secondary | ICD-10-CM | POA: Diagnosis present

## 2020-04-21 DIAGNOSIS — Z79899 Other long term (current) drug therapy: Secondary | ICD-10-CM

## 2020-04-21 DIAGNOSIS — N1831 Chronic kidney disease, stage 3a: Secondary | ICD-10-CM | POA: Diagnosis present

## 2020-04-21 DIAGNOSIS — I428 Other cardiomyopathies: Secondary | ICD-10-CM | POA: Diagnosis present

## 2020-04-21 DIAGNOSIS — Z9884 Bariatric surgery status: Secondary | ICD-10-CM

## 2020-04-21 DIAGNOSIS — Z79890 Hormone replacement therapy (postmenopausal): Secondary | ICD-10-CM

## 2020-04-21 DIAGNOSIS — E778 Other disorders of glycoprotein metabolism: Secondary | ICD-10-CM | POA: Diagnosis present

## 2020-04-21 DIAGNOSIS — I10 Essential (primary) hypertension: Secondary | ICD-10-CM | POA: Diagnosis present

## 2020-04-21 DIAGNOSIS — I517 Cardiomegaly: Secondary | ICD-10-CM | POA: Diagnosis not present

## 2020-04-21 DIAGNOSIS — Z950 Presence of cardiac pacemaker: Secondary | ICD-10-CM

## 2020-04-21 DIAGNOSIS — E11649 Type 2 diabetes mellitus with hypoglycemia without coma: Secondary | ICD-10-CM | POA: Diagnosis present

## 2020-04-21 DIAGNOSIS — E209 Hypoparathyroidism, unspecified: Secondary | ICD-10-CM | POA: Diagnosis present

## 2020-04-21 DIAGNOSIS — E43 Unspecified severe protein-calorie malnutrition: Secondary | ICD-10-CM | POA: Insufficient documentation

## 2020-04-21 DIAGNOSIS — Z96611 Presence of right artificial shoulder joint: Secondary | ICD-10-CM | POA: Diagnosis present

## 2020-04-21 DIAGNOSIS — Z885 Allergy status to narcotic agent status: Secondary | ICD-10-CM

## 2020-04-21 DIAGNOSIS — R0789 Other chest pain: Secondary | ICD-10-CM | POA: Diagnosis not present

## 2020-04-21 DIAGNOSIS — K219 Gastro-esophageal reflux disease without esophagitis: Secondary | ICD-10-CM | POA: Diagnosis present

## 2020-04-21 DIAGNOSIS — D638 Anemia in other chronic diseases classified elsewhere: Secondary | ICD-10-CM | POA: Diagnosis present

## 2020-04-21 DIAGNOSIS — I361 Nonrheumatic tricuspid (valve) insufficiency: Secondary | ICD-10-CM | POA: Diagnosis present

## 2020-04-21 DIAGNOSIS — Z841 Family history of disorders of kidney and ureter: Secondary | ICD-10-CM

## 2020-04-21 DIAGNOSIS — Z820 Family history of epilepsy and other diseases of the nervous system: Secondary | ICD-10-CM

## 2020-04-21 DIAGNOSIS — I951 Orthostatic hypotension: Secondary | ICD-10-CM | POA: Diagnosis present

## 2020-04-21 DIAGNOSIS — E890 Postprocedural hypothyroidism: Secondary | ICD-10-CM | POA: Diagnosis present

## 2020-04-21 DIAGNOSIS — G4700 Insomnia, unspecified: Secondary | ICD-10-CM | POA: Diagnosis present

## 2020-04-21 DIAGNOSIS — R634 Abnormal weight loss: Secondary | ICD-10-CM | POA: Diagnosis present

## 2020-04-21 DIAGNOSIS — M810 Age-related osteoporosis without current pathological fracture: Secondary | ICD-10-CM | POA: Diagnosis present

## 2020-04-21 DIAGNOSIS — I482 Chronic atrial fibrillation, unspecified: Secondary | ICD-10-CM | POA: Diagnosis present

## 2020-04-21 DIAGNOSIS — E559 Vitamin D deficiency, unspecified: Secondary | ICD-10-CM | POA: Diagnosis present

## 2020-04-21 DIAGNOSIS — Z7901 Long term (current) use of anticoagulants: Secondary | ICD-10-CM

## 2020-04-21 DIAGNOSIS — J4520 Mild intermittent asthma, uncomplicated: Secondary | ICD-10-CM

## 2020-04-21 DIAGNOSIS — R0902 Hypoxemia: Secondary | ICD-10-CM | POA: Diagnosis not present

## 2020-04-21 DIAGNOSIS — Z79891 Long term (current) use of opiate analgesic: Secondary | ICD-10-CM

## 2020-04-21 DIAGNOSIS — Z9049 Acquired absence of other specified parts of digestive tract: Secondary | ICD-10-CM

## 2020-04-21 DIAGNOSIS — Z20822 Contact with and (suspected) exposure to covid-19: Secondary | ICD-10-CM | POA: Diagnosis present

## 2020-04-21 DIAGNOSIS — Z8616 Personal history of COVID-19: Secondary | ICD-10-CM

## 2020-04-21 DIAGNOSIS — I4821 Permanent atrial fibrillation: Secondary | ICD-10-CM | POA: Diagnosis not present

## 2020-04-21 DIAGNOSIS — G4733 Obstructive sleep apnea (adult) (pediatric): Secondary | ICD-10-CM | POA: Diagnosis present

## 2020-04-21 DIAGNOSIS — I4891 Unspecified atrial fibrillation: Secondary | ICD-10-CM | POA: Diagnosis present

## 2020-04-21 DIAGNOSIS — R42 Dizziness and giddiness: Secondary | ICD-10-CM | POA: Diagnosis not present

## 2020-04-21 DIAGNOSIS — J45909 Unspecified asthma, uncomplicated: Secondary | ICD-10-CM

## 2020-04-21 DIAGNOSIS — I5042 Chronic combined systolic (congestive) and diastolic (congestive) heart failure: Secondary | ICD-10-CM | POA: Diagnosis not present

## 2020-04-21 DIAGNOSIS — G40909 Epilepsy, unspecified, not intractable, without status epilepticus: Secondary | ICD-10-CM

## 2020-04-21 DIAGNOSIS — D649 Anemia, unspecified: Secondary | ICD-10-CM

## 2020-04-21 DIAGNOSIS — E162 Hypoglycemia, unspecified: Secondary | ICD-10-CM | POA: Diagnosis present

## 2020-04-21 DIAGNOSIS — Z8249 Family history of ischemic heart disease and other diseases of the circulatory system: Secondary | ICD-10-CM

## 2020-04-21 DIAGNOSIS — M797 Fibromyalgia: Secondary | ICD-10-CM | POA: Diagnosis present

## 2020-04-21 DIAGNOSIS — I272 Pulmonary hypertension, unspecified: Secondary | ICD-10-CM | POA: Diagnosis present

## 2020-04-21 DIAGNOSIS — J449 Chronic obstructive pulmonary disease, unspecified: Secondary | ICD-10-CM | POA: Diagnosis present

## 2020-04-21 DIAGNOSIS — Z96653 Presence of artificial knee joint, bilateral: Secondary | ICD-10-CM | POA: Diagnosis present

## 2020-04-21 LAB — CBG MONITORING, ED: Glucose-Capillary: 106 mg/dL — ABNORMAL HIGH (ref 70–99)

## 2020-04-21 LAB — CBC WITH DIFFERENTIAL/PLATELET
Abs Immature Granulocytes: 0.01 10*3/uL (ref 0.00–0.07)
Basophils Absolute: 0.1 10*3/uL (ref 0.0–0.1)
Basophils Relative: 1 %
Eosinophils Absolute: 0.1 10*3/uL (ref 0.0–0.5)
Eosinophils Relative: 2 %
HCT: 33.8 % — ABNORMAL LOW (ref 36.0–46.0)
Hemoglobin: 10.5 g/dL — ABNORMAL LOW (ref 12.0–15.0)
Immature Granulocytes: 0 %
Lymphocytes Relative: 22 %
Lymphs Abs: 1 10*3/uL (ref 0.7–4.0)
MCH: 31 pg (ref 26.0–34.0)
MCHC: 31.1 g/dL (ref 30.0–36.0)
MCV: 99.7 fL (ref 80.0–100.0)
Monocytes Absolute: 0.4 10*3/uL (ref 0.1–1.0)
Monocytes Relative: 8 %
Neutro Abs: 3.2 10*3/uL (ref 1.7–7.7)
Neutrophils Relative %: 67 %
Platelets: 175 10*3/uL (ref 150–400)
RBC: 3.39 MIL/uL — ABNORMAL LOW (ref 3.87–5.11)
RDW: 16.6 % — ABNORMAL HIGH (ref 11.5–15.5)
WBC: 4.7 10*3/uL (ref 4.0–10.5)
nRBC: 0 % (ref 0.0–0.2)

## 2020-04-21 LAB — BASIC METABOLIC PANEL
Anion gap: 9 (ref 5–15)
BUN: 26 mg/dL — ABNORMAL HIGH (ref 8–23)
CO2: 23 mmol/L (ref 22–32)
Calcium: 6.9 mg/dL — ABNORMAL LOW (ref 8.9–10.3)
Chloride: 110 mmol/L (ref 98–111)
Creatinine, Ser: 1.27 mg/dL — ABNORMAL HIGH (ref 0.44–1.00)
GFR, Estimated: 46 mL/min — ABNORMAL LOW (ref >60)
Glucose, Bld: 65 mg/dL — ABNORMAL LOW (ref 70–99)
Potassium: 4.2 mmol/L (ref 3.5–5.1)
Sodium: 142 mmol/L (ref 135–145)

## 2020-04-21 LAB — BRAIN NATRIURETIC PEPTIDE: B Natriuretic Peptide: 167.9 pg/mL — ABNORMAL HIGH (ref 0.0–100.0)

## 2020-04-21 LAB — TROPONIN I (HIGH SENSITIVITY): Troponin I (High Sensitivity): 4 ng/L (ref <18)

## 2020-04-21 MED ORDER — METHYLPREDNISOLONE SODIUM SUCC 125 MG IJ SOLR: 125.0000 mg | Freq: Once | INTRAMUSCULAR | Status: DC

## 2020-04-21 MED ORDER — SODIUM CHLORIDE 0.9 % IV BOLUS: 500.0000 mL | Freq: Once | INTRAVENOUS | Status: DC

## 2020-04-21 MED ORDER — SODIUM CHLORIDE 0.9 % IV BOLUS
1000.0000 mL | Freq: Once | INTRAVENOUS | Status: AC
  Administered 2020-04-21: 1000 mL via INTRAVENOUS

## 2020-04-21 NOTE — Telephone Encounter (Signed)
Call returned to patient, confirmed DOB. She reports she was here about 2 weeks ago and since then she has had a CT and has been placed on oxygen. She is requesting to have an appt to talk about these things. She would like to review the CT with the provider. Requested Dr Halford Chessman however first available was too far out for her liking. Requested to see TP. Appt made. Nothing further needed at this time. Will route message to provider as FYI.   Nothing further needed at this time.

## 2020-04-21 NOTE — ED Notes (Signed)
RN walked pt in [facility] on 3L of O2. Pts stats dropped to 92%, R-40, P-72.

## 2020-04-21 NOTE — ED Notes (Signed)
RN put in order for IV team an hour ago. Pt has not received bolus due to no IV.

## 2020-04-21 NOTE — ED Notes (Signed)
IV team at bedside 

## 2020-04-21 NOTE — ED Provider Notes (Signed)
[facility]ERG[facility] DEPARTMENT Provider Note   CSN: [id number] Arrival date & time: 04/21/20  1510     History Chief Complaint  Patient presents with  . Shortness of Breath    Jillian Eaton is a 70 y.o. female with pertinent past medical history of fibromyalgia, shortness of breath, CHF with pacemaker, recent COVID-19 infection, mild asthma, A. fib that presents the emergency department today for shortness of breath brought in by EMS. Patient states that she has had increasing shortness of breath for the past couple of weeks, worse today. Patient states that she seen her cardiologist and her pulmonologist for this recently. Recently had a CT angio chest PE study done which did not show any evidence of pulmonary emboli, did show evidence of pulmonary hypertension and small bilateral pleural effusions. Patient was placed on 2 L of oxygen yesterday, states that this has been helping her, when EMS arrived she was at 70% on her 2 L of oxygen, by the time she arrived here she was at 100% on 2 L of oxygen. Patient states that she has had these spells of hypoxia and shortness of breath over the past couple of years, has not been diagnosed with anything specifically, however when she does have these episodes of shortness of breath she does require oxygen. Does not wear oxygen daily. Denies any chest pain, leg swelling, headache, nausea, vomiting, abdominal pain. Patient states that she was short of breath yesterday, however today was way worse. Denies any cough or URI symptoms. Denies any fevers. Patient states that her and her husband had COVID in January, vaccinated and boosted. Per pulmonology note, she has been more short of breath with activities and increased activity tolerance after being diagnosed with COVID in January. Patient has A. fib on Eliquis, endorses compliance. No leg swelling, orthopnea.    Past Medical History:  Diagnosis Date  . Anemia    iron and  pernicious  . Arthritis   . Asthma   . Cancer ([place])   . CHF (congestive heart failure) ([place]) 2018  . Complication of anesthesia    BP dropped   . DOE (dyspnea on exertion)    2D ECHO, 02/12/2012 - EF 60-65%, moderate concentric hypertrophy  . Dyspnea   . Dysrhythmia    a-fib  . Fibromyalgia    nerve pain"left side at waist level" "can't lay on that side without pain" , "HOB elevation helps"; pt. thinks this has resolved after 200lb weight loss9-17- since i lost the wieght , i dont  think i have this anymore   . Headache 04/2019  . Heart murmur   . Hematuria - cause not known    resolved   . History of COVID-19   . History of kidney stones    x 2 '13, '14 surgery to remove  . Hypertension   . Pneumonia    aspiration  04/2016  . Presence of permanent cardiac pacemaker   . SBO (small bowel obstruction) ([place])    rqueired admission 2020  . Thyroid disease    "goiter"  . Transfusion history    10 yrs+    Patient Active Problem List   Diagnosis Date Noted  . Shortness of breath 04/21/2020  . Complete heart block ([place]) 09/24/2019  . Pacemaker 09/24/2019  . Iron deficiency anemia 09/15/2019  . Atrial fibrillation with RVR ([place]) 06/15/2019  . Fatigue 06/14/2019  . S/P right TKA 04/29/2019  . Medication management 03/11/2019  . Stiffness of left knee 12/12/2018  .  Acute on chronic respiratory failure with hypoxia ([place]) 12/07/2018  . Normocytic anemia 12/07/2018  . Asthma 12/07/2018  . S/P left TKA 11/14/2018  . Status post total left knee replacement 11/14/2018  . Degenerative joint disease involving multiple joints on both sides of body 07/31/2018  . Rotator cuff tear arthropathy of right shoulder 04/05/2018  . Overactive bladder 04/05/2018  . Steroid-induced hyperglycemia   . Generalized OA   . 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 NICM (nonischemic cardiomyopathy) ([place]) 01/15/2018  . Osteoporosis 12/13/2017  . Rotator cuff tear arthropathy 11/29/2017  .  Chronic diastolic CHF (congestive heart failure) ([place]) 09/28/2017  . Acute renal failure superimposed on stage 3 chronic kidney disease ([place]) 09/07/2017  . Hypomagnesemia with secondary hypocalcemia 09/07/2017  . 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 Hypercapnemia 07/13/2017  . AKI (acute kidney injury) ([place]) 07/10/2017  . Vertigo 07/08/2017  . Blurring of visual image 07/08/2017  . On anticoagulant therapy 06/19/2017  . Chronic respiratory failure with hypoxia ([place]) 04/19/2017  . GERD (gastroesophageal reflux disease) 08/28/2016  . 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 Depressive disorder 08/28/2016  . Iatrogenic hypocalcemia 08/28/2016  . Chronic pain syndrome   . Acute diastolic (congestive) heart failure ([place])   . Oropharyngeal dysphagia   . Chronic atrial fibrillation ([place]) 04/28/2016  . Vocal cord dysfunction 04/28/2016  . Thrombocytopenia ([place]) 04/28/2016  . Seizures ([place])   . Preoperative cardiovascular examination   . 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  . Leg swelling 10/19/2015  . Acute on chronic respiratory failure with hypoxia and hypercapnia ([place]) 08/03/2015  . Bilateral leg edema 05/11/2015  . Essential hypertension 05/11/2015  . Nephrolithiasis 04/12/2015  . Numbness in both hands 04/12/2015  . Complete tear of left rotator cuff 11/19/2014  . Primary osteoarthritis of both knees 03/24/2014  . Chronic asthmatic bronchitis ([place]) 07/30/2013  . Bariatric surgery status 07/30/2013  . Urge incontinence of urine 07/30/2013  . Vitamin D deficiency 07/30/2013  . B-complex deficiency 07/30/2013  . Cough 07/30/2013  . Gross hematuria 07/30/2013  . Palpitations 07/30/2013  . Right flank pain 07/30/2013  . SBO (small bowel obstruction) ([place]) 06/07/2013  . Pulmonary HTN ([place]) 01/08/2013  . Insomnia 03/22/2012  . Mild intermittent asthma with acute exacerbation 03/22/2012  . Fibromyalgia 10/26/2011  . Anemia of chronic disease 03/28/2011   . Obesity (BMI 30-39.9) 09/22/2010    Past Surgical History:  Procedure Laterality Date  . ABDOMINAL ADHESION SURGERY  2004   open LOA - in [place]  . AV NODE ABLATION N/A 06/20/2019   Procedure: AV NODE ABLATION;  Surgeon: Evans Lance, MD;  Location: [facility];  Service: Cardiovascular;  Laterality: N/A;  . BALLOON DILATION N/A 07/25/2019   Procedure: BALLOON DILATION;  Surgeon: Carol Ada, MD;  Location: [facility];  Service: Endoscopy;  Laterality: N/A;  . CARDIAC CATHETERIZATION  04/04/2010   No significant obstructive coronary artery disease  . CARDIOVERSION N/A 08/08/2017   Procedure: CARDIOVERSION;  Surgeon: Pixie Casino, MD;  Location: [facility];  Service: Cardiovascular;  Laterality: N/A;  . CARDIOVERSION N/A 06/18/2019   Procedure: CARDIOVERSION;  Surgeon: Pixie Casino, MD;  Location: [facility];  Service: Cardiovascular;  Laterality: N/A;  . CHOLECYSTECTOMY  1990  . COLONOSCOPY W/ POLYPECTOMY    . COLONOSCOPY WITH PROPOFOL N/A 04/10/2014   Procedure: COLONOSCOPY WITH PROPOFOL;  Surgeon: Beryle Beams, MD;  Location: [facility];  Service: Endoscopy;  Laterality: N/A;  . CYSTOSCOPY/URETEROSCOPY/HOLMIUM LASER/STENT PLACEMENT Right 08/13/2019  Procedure: ZOXWRUEAVW/UJWJXBJYNW/GNFAOZHYQMVH/QIONGEX LASER/STENT PLACEMENT;  Surgeon: Ceasar Mons, MD;  Location: [facility];  Service: Urology;  Laterality: Right;  ONLY NEEDS 45 MIN  . ESOPHAGOGASTRODUODENOSCOPY (EGD) WITH PROPOFOL N/A 07/25/2019   Procedure: ESOPHAGOGASTRODUODENOSCOPY (EGD) WITH PROPOFOL;  Surgeon: Carol Ada, MD;  [facility];  Service: Endoscopy;  Laterality: N/A;  . EYE SURGERY     lasik 20-25 yrs ago  . GASTROPLASTY VERTICAL BANDED  1985  . [place]   In [place] for SBO  . MALONEY DILATION  07/25/2019   Procedure: Venia Minks DILATION;  Surgeon: Carol Ada, MD;  Location: [facility];  Service: Endoscopy;;  . PACEMAKER IMPLANT N/A  06/20/2019   Procedure: PACEMAKER IMPLANT;  Surgeon: Evans Lance, MD;  Location: [facility];  Service: Cardiovascular;  Laterality: N/A;  . REVERSE SHOULDER ARTHROPLASTY Right 03/29/2018   Procedure: REVERSE SHOULDER ARTHROPLASTY;  Surgeon: Netta Cedars, MD;  Location: [facility];  Service: Orthopedics;  Laterality: Right;  . ROUX-EN-Y GASTRIC BYPASS  1992   Conversion VBG to RnYGB in [place], [place]  . SHOULDER ARTHROSCOPY WITH SUBACROMIAL DECOMPRESSION  2016   Dr Berenice Primas, [place], [place]  . SMALL INTESTINE SURGERY  2000   SBO - LOA w SB resection  . TOTAL KNEE ARTHROPLASTY Left 11/14/2018   Procedure: TOTAL KNEE ARTHROPLASTY;  Surgeon: Paralee Cancel, MD;  [facility]: [facility];  Service: Orthopedics;  Laterality: Left;  70 mins  . TOTAL KNEE ARTHROPLASTY Right 04/29/2019   Procedure: TOTAL KNEE ARTHROPLASTY;  Surgeon: Paralee Cancel, MD;  Location: [facility];  Service: Orthopedics;  Laterality: Right;  70 mins  . TOTAL THYROIDECTOMY  06/26/2012   Dr Celine Ahr, [facility]  . TUBAL LIGATION  1986  . URETHRAL DILATION  2012   w cystoscopy.  Dr Gaynelle Arabian  . UTERINE FIBROID EMBOLIZATION       OB History   No obstetric history on file.     Family History  Problem Relation Age of Onset  . Diabetes Mother   . Epilepsy Mother   . Cancer Mother        Breast  . Hypertension Mother   . Breast cancer Mother   . Seizures Mother   . Kidney disease Father   . Diabetes Father   . Hypertension Father   . Asthma Father   . Heart disease Father   . Epilepsy Sister   . Cancer Maternal Grandmother   . Breast cancer Maternal Grandmother   . Cancer Paternal Grandmother   . Breast cancer Paternal Grandmother     Social History   Tobacco Use  . Smoking status: Never Smoker  . Smokeless tobacco: Never Used  Vaping Use  . Vaping Use: Never used  Substance Use Topics  . Alcohol use: No    Comment: wine occ  . Drug use: No    Types: Oxycodone    Comment: perscribed    Home  Medications Prior to Admission medications   Medication Sig Start Date End Date Taking? Authorizing Provider  acetaminophen (TYLENOL) 650 MG CR tablet Take 1,300 mg by mouth daily.    [provider]  albuterol (VENTOLIN HFA) 108 (90 Base) MCG/ACT inhaler Inhale 2 puffs into the lungs every 6 (six) hours as needed for wheezing or shortness of breath.    [provider]  calcitRIOL (ROCALTROL) 0.25 MCG capsule Take 1 capsule (0.25 mcg total) by mouth daily. Patient taking differently: Take 0.5 mcg by mouth daily. 07/09/19   Philemon Kingdom, MD  cyanocobalamin (,VITAMIN B-12,) 1000 MCG/ML injection Inject 1 mL (1,000 mcg total) into the muscle every 30 (thirty) days. 04/05/18   Hennie Duos, MD  cyclobenzaprine (FLEXERIL) 10 MG tablet Take 20 mg by mouth at bedtime.     [provider]  Docusate Sodium (DSS) 100 MG CAPS Take 100 mg by mouth daily.    [provider]  DULoxetine (CYMBALTA) 60 MG capsule Take 1 capsule (60 mg total) by mouth daily. 04/05/18   Hennie Duos, MD  ELIQUIS 5 MG TABS tablet TAKE 1 TABLET TWICE A DAY Patient taking differently: Take 5 mg by mouth 2 (two) times daily. 04/12/20   Isaiah Serge, NP  eszopiclone (LUNESTA) 2 MG TABS tablet Take 1 tablet (2 mg total) by mouth at bedtime. 04/05/18   Hennie Duos, MD  famotidine (PEPCID) 20 MG tablet Take 20 mg by mouth daily as needed for heartburn or indigestion.    [provider]  FOLIC ACID PO Take 1 tablet by mouth daily.    [provider]  furosemide (LASIX) 20 MG tablet Take 20 mg by mouth daily as needed for edema.    [provider]  levothyroxine (SYNTHROID) 137 MCG tablet TAKE 1 TABLET (137 MCG TOTAL) BY MOUTH DAILY BEFORE BREAKFAST. 11/17/19   Philemon Kingdom, MD  magnesium oxide (MAG-OX) 400 (241.3 Mg) MG tablet Take 400 mg by mouth daily.  04/29/19   [provider]  mirtazapine (REMERON) 7.5 MG tablet Take 7.5 mg by mouth at  bedtime.    [provider]  montelukast (SINGULAIR) 10 MG tablet TAKE 1 TABLET BY MOUTH EVERYDAY AT BEDTIME Patient taking differently: Take 10 mg by mouth at bedtime. 03/08/20   Chesley Mires, MD  potassium chloride (KLOR-CON) 10 MEQ tablet Take 10 mEq by mouth daily. 04/07/20   [provider]  traMADol (ULTRAM) 50 MG tablet Take 50 mg by mouth 3 (three) times daily as needed for moderate pain.    [provider]  Vitamin D, Ergocalciferol, (DRISDOL) 1.25 MG (50000 UT) CAPS capsule Take 1 capsule (50,000 Units total) by mouth every Wednesday. 04/10/18   Hennie Duos, MD    Allergies    Hydrocodone  Review of Systems   Review of Systems  Constitutional: Negative for chills, diaphoresis, fatigue and fever.  HENT: Negative for congestion, sore throat and trouble swallowing.   Eyes: Negative for pain and visual disturbance.  Respiratory: Positive for shortness of breath. Negative for cough and wheezing.   Cardiovascular: Negative for chest pain, palpitations and leg swelling.  Gastrointestinal: Negative for abdominal distention, abdominal pain, diarrhea, nausea and vomiting.  Genitourinary: Negative for difficulty urinating.  Musculoskeletal: Negative for back pain, neck pain and neck stiffness.  Skin: Negative for pallor.  Neurological: Negative for dizziness, speech difficulty, weakness and headaches.  Psychiatric/Behavioral: Negative for confusion.    Physical Exam Updated Vital Signs BP 117/77   Pulse (!) 59   Temp 98.6 F (37 C) (Oral)   Resp 16   SpO2 100%   Physical Exam Constitutional:      General: She is not in acute distress.    Appearance: Normal appearance. She is not ill-appearing, toxic-appearing or diaphoretic.     Comments: Patient appears well  HENT:     Mouth/Throat:     Mouth: Mucous membranes are moist.     Pharynx: Oropharynx is clear.  Eyes:     General: No scleral icterus.    Extraocular Movements: Extraocular movements  intact.     Pupils: Pupils are equal, round, and reactive to light.  Cardiovascular:     Rate and Rhythm: Normal rate and regular rhythm.     Pulses: Normal pulses.     Heart sounds: Normal heart sounds.  Pulmonary:     Effort: Pulmonary effort is normal. No respiratory distress.     Breath sounds: Normal breath sounds. No stridor. No wheezing, rhonchi or rales.     Comments: On 2L Chest:     Chest wall: No tenderness.  Abdominal:     General: Abdomen is flat. There is no distension.     Palpations: Abdomen is soft.     Tenderness: There is no abdominal tenderness. There is no guarding or rebound.  Musculoskeletal:        General: No swelling or tenderness. Normal range of motion.     Cervical back: Normal range of motion and neck supple. No rigidity.     Right lower leg: No edema.     Left lower leg: No edema.  Skin:    General: Skin is warm and dry.     Capillary Refill: Capillary refill takes less than 2 seconds.     Coloration: Skin is not pale.  Neurological:     General: No focal deficit present.     Mental Status: She is alert and oriented to person, place, and time.  Psychiatric:        Mood and Affect: Mood normal.        Behavior: Behavior normal.     ED Results / Procedures / Treatments   Labs (all labs ordered are listed, but only abnormal results are displayed) Labs Reviewed  BASIC METABOLIC PANEL - Abnormal; Notable for the following components:      Result Value   Glucose, Bld 65 (*)    BUN 26 (*)    Creatinine, Ser 1.27 (*)    Calcium 6.9 (*)    GFR, Estimated 46 (*)    All other components within normal limits  CBC WITH DIFFERENTIAL/PLATELET - Abnormal; Notable for the following components:   RBC 3.39 (*)    Hemoglobin 10.5 (*)    HCT 33.8 (*)    RDW 16.6 (*)    All other components within normal limits  BRAIN NATRIURETIC PEPTIDE - Abnormal; Notable for the following components:   B Natriuretic Peptide 167.9 (*)    All other components within  normal limits  CBG MONITORING, ED - Abnormal; Notable for the following components:   Glucose-Capillary 106 (*)    All other components within normal limits  CBG MONITORING, ED  TROPONIN I (HIGH SENSITIVITY)    EKG None  Radiology DG Chest 2 View  Result Date: 04/21/2020 CLINICAL DATA:  Shortness of breath for 1 day EXAM: CHEST - 2 VIEW COMPARISON:  04/01/2020 FINDINGS: Pacing device is again noted. Cardiac enlargement is again seen and stable. The lungs are clear bilaterally. No acute bony abnormality is seen. Right shoulder replacement is noted. IMPRESSION: No acute abnormality noted.  Stable cardiomegaly is seen. Electronically Signed   By: Inez Catalina M.D.   On: 04/21/2020 16:54    Procedures Procedures   Medications Ordered in ED Medications  methylPREDNISolone sodium succinate (SOLU-MEDROL) 125 mg/2 mL injection 125 mg (has no administration in time range)  sodium chloride 0.9 % bolus 1,000 mL (0 mLs Intravenous Stopped 04/21/20 2041)    ED Course  I have reviewed the triage vital signs and the nursing notes.  Pertinent  labs & imaging results that were available during my care of the patient were reviewed by me and considered in my medical decision making (see chart for details).    MDM Rules/Calculators/A&P                          Margaretta Aysa Larivee is a 70 y.o. female with pertinent past medical history of fibromyalgia, shortness of breath, CHF with pacemaker, recent COVID-19 infection, mild asthma, A. fib that presents the emergency department today for shortness of breath brought in by EMS.  Lung sounds clear, patient is stable.  Differential to include CHF exacerbation, pneumonia, ACS, post Covid hypoxia.  Patient appears well currently, no respiratory stress, satting at 100% on 2 L.Low likelihood for PE, patient recently had PE study done whichwas negative, patient is also anticoagulated with Eliquis, does not report any missed dosing.   Work-up today shows CBC  with hemoglobin of 10.5, does appear stable.  BNP 167.9.  Troponin IV.   On ambulation, patient desatted to 92% on 3 L, however persistently hypotensive and states that she is short of breath.  Orthostatics positive.  Will give fluids at this time and reevaluate.  After fluids, patient still states that she is short of breath, repeat blood pressure with positive orthostatics, blood pressure 81/61.  Hesitant to give more fluid since patient does have CHF.  Patient will need to be admitted at this time for observation.  Patient does want to be admitted at this time.  Spoke to Dr. Cyd Silence, hospitalist who will accept the patient.  The patient appears reasonably stabilized for admission considering the current resources, flow, and capabilities available in the ED at this time, and I doubt any other [facility] requiring further screening and/or treatment in the ED prior to admission  I discussed this case with my attending physician who cosigned this note including patient's presenting symptoms, physical exam, and planned diagnostics and interventions. Attending physician stated agreement with plan or made changes to plan which were implemented.   Attending physician assessed patient at bedside.  Final Clinical Impression(s) / ED Diagnoses Final diagnoses:  SOB (shortness of breath)  Orthostatic hypotension    Rx / DC Orders ED Discharge Orders    None       Alfredia Client, PA-C 04/21/20 2123    Davonna Belling, MD 04/22/20 2350

## 2020-04-21 NOTE — ED Notes (Signed)
Pt got up to use the bedside commode and BP dropped to 65/56 but O2 sat remained 96-99 on 3L

## 2020-04-21 NOTE — Discharge Instructions (Addendum)

## 2020-04-21 NOTE — ED Triage Notes (Addendum)
Pt came via EMS. Pt called out due to shortness of breath while cooking lunch. Pt states she could not catch her breath and passed out. Pt has a hx of CHF and vertigo. PCP started pt on O2 yesterday. Pt is currently on 3L. Pt denies chest pain, headache, and pain. Pt is axox4. Pt states she feels better.

## 2020-04-22 ENCOUNTER — Encounter (HOSPITAL_COMMUNITY): Payer: Self-pay | Admitting: Internal Medicine

## 2020-04-22 ENCOUNTER — Observation Stay (HOSPITAL_COMMUNITY): Payer: Medicare Other

## 2020-04-22 DIAGNOSIS — I5042 Chronic combined systolic (congestive) and diastolic (congestive) heart failure: Secondary | ICD-10-CM | POA: Diagnosis not present

## 2020-04-22 DIAGNOSIS — I361 Nonrheumatic tricuspid (valve) insufficiency: Secondary | ICD-10-CM

## 2020-04-22 DIAGNOSIS — E890 Postprocedural hypothyroidism: Secondary | ICD-10-CM

## 2020-04-22 DIAGNOSIS — E162 Hypoglycemia, unspecified: Secondary | ICD-10-CM | POA: Diagnosis present

## 2020-04-22 DIAGNOSIS — R609 Edema, unspecified: Secondary | ICD-10-CM | POA: Diagnosis not present

## 2020-04-22 DIAGNOSIS — G4733 Obstructive sleep apnea (adult) (pediatric): Secondary | ICD-10-CM | POA: Diagnosis present

## 2020-04-22 DIAGNOSIS — I130 Hypertensive heart and chronic kidney disease with heart failure and stage 1 through stage 4 chronic kidney disease, or unspecified chronic kidney disease: Secondary | ICD-10-CM | POA: Diagnosis present

## 2020-04-22 DIAGNOSIS — E209 Hypoparathyroidism, unspecified: Secondary | ICD-10-CM | POA: Diagnosis present

## 2020-04-22 DIAGNOSIS — R0902 Hypoxemia: Secondary | ICD-10-CM | POA: Diagnosis not present

## 2020-04-22 DIAGNOSIS — J9621 Acute and chronic respiratory failure with hypoxia: Secondary | ICD-10-CM | POA: Diagnosis present

## 2020-04-22 DIAGNOSIS — D638 Anemia in other chronic diseases classified elsewhere: Secondary | ICD-10-CM | POA: Diagnosis present

## 2020-04-22 DIAGNOSIS — Z20822 Contact with and (suspected) exposure to covid-19: Secondary | ICD-10-CM | POA: Diagnosis present

## 2020-04-22 DIAGNOSIS — I442 Atrioventricular block, complete: Secondary | ICD-10-CM | POA: Diagnosis present

## 2020-04-22 DIAGNOSIS — E11649 Type 2 diabetes mellitus with hypoglycemia without coma: Secondary | ICD-10-CM | POA: Diagnosis present

## 2020-04-22 DIAGNOSIS — R634 Abnormal weight loss: Secondary | ICD-10-CM | POA: Diagnosis not present

## 2020-04-22 DIAGNOSIS — E539 Vitamin B deficiency, unspecified: Secondary | ICD-10-CM | POA: Diagnosis present

## 2020-04-22 DIAGNOSIS — J449 Chronic obstructive pulmonary disease, unspecified: Secondary | ICD-10-CM | POA: Diagnosis present

## 2020-04-22 DIAGNOSIS — R0602 Shortness of breath: Secondary | ICD-10-CM | POA: Diagnosis not present

## 2020-04-22 DIAGNOSIS — I10 Essential (primary) hypertension: Secondary | ICD-10-CM | POA: Diagnosis not present

## 2020-04-22 DIAGNOSIS — N1831 Chronic kidney disease, stage 3a: Secondary | ICD-10-CM | POA: Diagnosis not present

## 2020-04-22 DIAGNOSIS — M810 Age-related osteoporosis without current pathological fracture: Secondary | ICD-10-CM | POA: Diagnosis present

## 2020-04-22 DIAGNOSIS — I428 Other cardiomyopathies: Secondary | ICD-10-CM | POA: Diagnosis present

## 2020-04-22 DIAGNOSIS — I351 Nonrheumatic aortic (valve) insufficiency: Secondary | ICD-10-CM

## 2020-04-22 DIAGNOSIS — I482 Chronic atrial fibrillation, unspecified: Secondary | ICD-10-CM

## 2020-04-22 DIAGNOSIS — K219 Gastro-esophageal reflux disease without esophagitis: Secondary | ICD-10-CM | POA: Diagnosis present

## 2020-04-22 DIAGNOSIS — G4700 Insomnia, unspecified: Secondary | ICD-10-CM | POA: Diagnosis present

## 2020-04-22 DIAGNOSIS — M797 Fibromyalgia: Secondary | ICD-10-CM | POA: Diagnosis present

## 2020-04-22 DIAGNOSIS — J9691 Respiratory failure, unspecified with hypoxia: Secondary | ICD-10-CM | POA: Diagnosis not present

## 2020-04-22 DIAGNOSIS — I272 Pulmonary hypertension, unspecified: Secondary | ICD-10-CM

## 2020-04-22 DIAGNOSIS — G40909 Epilepsy, unspecified, not intractable, without status epilepticus: Secondary | ICD-10-CM | POA: Diagnosis not present

## 2020-04-22 DIAGNOSIS — I4821 Permanent atrial fibrillation: Secondary | ICD-10-CM | POA: Diagnosis present

## 2020-04-22 DIAGNOSIS — I951 Orthostatic hypotension: Secondary | ICD-10-CM | POA: Diagnosis present

## 2020-04-22 DIAGNOSIS — J4520 Mild intermittent asthma, uncomplicated: Secondary | ICD-10-CM | POA: Diagnosis not present

## 2020-04-22 DIAGNOSIS — E559 Vitamin D deficiency, unspecified: Secondary | ICD-10-CM | POA: Diagnosis present

## 2020-04-22 LAB — GLUCOSE, CAPILLARY
Glucose-Capillary: 101 mg/dL — ABNORMAL HIGH (ref 70–99)
Glucose-Capillary: 51 mg/dL — ABNORMAL LOW (ref 70–99)
Glucose-Capillary: 63 mg/dL — ABNORMAL LOW (ref 70–99)
Glucose-Capillary: 64 mg/dL — ABNORMAL LOW (ref 70–99)
Glucose-Capillary: 64 mg/dL — ABNORMAL LOW (ref 70–99)
Glucose-Capillary: 75 mg/dL (ref 70–99)

## 2020-04-22 LAB — CBC WITH DIFFERENTIAL/PLATELET
Abs Immature Granulocytes: 0 10*3/uL (ref 0.00–0.07)
Basophils Absolute: 0.1 10*3/uL (ref 0.0–0.1)
Basophils Relative: 1 %
Eosinophils Absolute: 0.1 10*3/uL (ref 0.0–0.5)
Eosinophils Relative: 3 %
HCT: 27.8 % — ABNORMAL LOW (ref 36.0–46.0)
Hemoglobin: 9 g/dL — ABNORMAL LOW (ref 12.0–15.0)
Immature Granulocytes: 0 %
Lymphocytes Relative: 29 %
Lymphs Abs: 1.1 10*3/uL (ref 0.7–4.0)
MCH: 32 pg (ref 26.0–34.0)
MCHC: 32.4 g/dL (ref 30.0–36.0)
MCV: 98.9 fL (ref 80.0–100.0)
Monocytes Absolute: 0.5 10*3/uL (ref 0.1–1.0)
Monocytes Relative: 13 %
Neutro Abs: 2 10*3/uL (ref 1.7–7.7)
Neutrophils Relative %: 54 %
Platelets: 149 10*3/uL — ABNORMAL LOW (ref 150–400)
RBC: 2.81 MIL/uL — ABNORMAL LOW (ref 3.87–5.11)
RDW: 16.6 % — ABNORMAL HIGH (ref 11.5–15.5)
WBC: 3.8 10*3/uL — ABNORMAL LOW (ref 4.0–10.5)
nRBC: 0 % (ref 0.0–0.2)

## 2020-04-22 LAB — I-STAT ARTERIAL BLOOD GAS, ED
Acid-base deficit: 1 mmol/L (ref 0.0–2.0)
Bicarbonate: 22.8 mmol/L (ref 20.0–28.0)
Calcium, Ion: 0.98 mmol/L — ABNORMAL LOW (ref 1.15–1.40)
HCT: 26 % — ABNORMAL LOW (ref 36.0–46.0)
Hemoglobin: 8.8 g/dL — ABNORMAL LOW (ref 12.0–15.0)
O2 Saturation: 99 %
Patient temperature: 98.6
Potassium: 3.9 mmol/L (ref 3.5–5.1)
Sodium: 142 mmol/L (ref 135–145)
TCO2: 24 mmol/L (ref 22–32)
pCO2 arterial: 34.6 mmHg (ref 32.0–48.0)
pH, Arterial: 7.427 (ref 7.350–7.450)
pO2, Arterial: 121 mmHg — ABNORMAL HIGH (ref 83.0–108.0)

## 2020-04-22 LAB — ECHOCARDIOGRAM COMPLETE
AR max vel: 4.21 cm2
AV Area VTI: 4.16 cm2
AV Area mean vel: 3.83 cm2
AV Mean grad: 1 mmHg
AV Peak grad: 2.3 mmHg
Ao pk vel: 0.75 m/s
Area-P 1/2: 5.54 cm2
Calc EF: 60.4 %
S' Lateral: 2.9 cm
Single Plane A2C EF: 52.5 %
Single Plane A4C EF: 64 %

## 2020-04-22 LAB — COMPREHENSIVE METABOLIC PANEL
ALT: 24 U/L (ref 0–44)
AST: 21 U/L (ref 15–41)
Albumin: 2 g/dL — ABNORMAL LOW (ref 3.5–5.0)
Alkaline Phosphatase: 56 U/L (ref 38–126)
Anion gap: 7 (ref 5–15)
BUN: 26 mg/dL — ABNORMAL HIGH (ref 8–23)
CO2: 22 mmol/L (ref 22–32)
Calcium: 6.3 mg/dL — CL (ref 8.9–10.3)
Chloride: 112 mmol/L — ABNORMAL HIGH (ref 98–111)
Creatinine, Ser: 1.2 mg/dL — ABNORMAL HIGH (ref 0.44–1.00)
GFR, Estimated: 50 mL/min — ABNORMAL LOW (ref >60)
Glucose, Bld: 67 mg/dL — ABNORMAL LOW (ref 70–99)
Potassium: 3.9 mmol/L (ref 3.5–5.1)
Sodium: 141 mmol/L (ref 135–145)
Total Bilirubin: 0.8 mg/dL (ref 0.3–1.2)
Total Protein: 4.8 g/dL — ABNORMAL LOW (ref 6.5–8.1)

## 2020-04-22 LAB — CBG MONITORING, ED
Glucose-Capillary: 93 mg/dL (ref 70–99)
Glucose-Capillary: 47 mg/dL — ABNORMAL LOW (ref 70–99)

## 2020-04-22 LAB — TROPONIN I (HIGH SENSITIVITY): Troponin I (High Sensitivity): 5 ng/L (ref <18)

## 2020-04-22 LAB — MAGNESIUM: Magnesium: 1.6 mg/dL — ABNORMAL LOW (ref 1.7–2.4)

## 2020-04-22 LAB — TSH: TSH: 4.487 u[IU]/mL (ref 0.350–4.500)

## 2020-04-22 LAB — SARS CORONAVIRUS 2 (TAT 6-24 HRS): SARS Coronavirus 2: NEGATIVE

## 2020-04-22 LAB — HIV ANTIBODY (ROUTINE TESTING W REFLEX): HIV Screen 4th Generation wRfx: NONREACTIVE

## 2020-04-22 MED ORDER — POLYETHYLENE GLYCOL 3350 17 G PO PACK
17.0000 g | PACK | Freq: Every day | ORAL | Status: DC | PRN
  Administered 2020-04-25: 17 g via ORAL
  Filled 2020-04-22: qty 1

## 2020-04-22 MED ORDER — ALBUTEROL SULFATE HFA 108 (90 BASE) MCG/ACT IN AERS: 2.0000 | INHALATION_SPRAY | Freq: Four times a day (QID) | RESPIRATORY_TRACT | Status: DC | PRN

## 2020-04-22 MED ORDER — MONTELUKAST SODIUM 10 MG PO TABS
10.0000 mg | ORAL_TABLET | Freq: Every day | ORAL | Status: DC
  Administered 2020-04-22 – 2020-05-03 (×13): 10 mg via ORAL
  Filled 2020-04-22 (×15): qty 1

## 2020-04-22 MED ORDER — FOLIC ACID 1 MG PO TABS
1.0000 mg | ORAL_TABLET | Freq: Every day | ORAL | Status: DC
  Administered 2020-04-22 – 2020-05-04 (×12): 1 mg via ORAL
  Filled 2020-04-22 (×14): qty 1

## 2020-04-22 MED ORDER — COSYNTROPIN 0.25 MG IJ SOLR
0.2500 mg | Freq: Once | INTRAMUSCULAR | Status: AC
  Administered 2020-04-23: 0.25 mg via INTRAVENOUS
  Filled 2020-04-22: qty 0.25

## 2020-04-22 MED ORDER — LEVOTHYROXINE SODIUM 25 MCG PO TABS
137.0000 ug | ORAL_TABLET | Freq: Every day | ORAL | Status: DC
  Administered 2020-04-22 – 2020-05-04 (×12): 137 ug via ORAL
  Filled 2020-04-22 (×13): qty 1

## 2020-04-22 MED ORDER — ONDANSETRON HCL 4 MG PO TABS: 4.0000 mg | ORAL_TABLET | Freq: Four times a day (QID) | ORAL | Status: DC | PRN

## 2020-04-22 MED ORDER — FAMOTIDINE 20 MG PO TABS: 20.0000 mg | ORAL_TABLET | Freq: Every day | ORAL | Status: DC | PRN

## 2020-04-22 MED ORDER — CALCIUM CARBONATE 1250 (500 CA) MG PO TABS
1.0000 | ORAL_TABLET | Freq: Two times a day (BID) | ORAL | Status: DC
  Administered 2020-04-22 – 2020-04-23 (×2): 500 mg via ORAL
  Filled 2020-04-22 (×3): qty 1

## 2020-04-22 MED ORDER — SODIUM CHLORIDE 0.9 % IV SOLN: INTRAVENOUS | Status: DC

## 2020-04-22 MED ORDER — ZOLPIDEM TARTRATE 5 MG PO TABS
5.0000 mg | ORAL_TABLET | Freq: Every evening | ORAL | Status: DC | PRN
  Administered 2020-04-22 – 2020-05-03 (×11): 5 mg via ORAL
  Filled 2020-04-22 (×11): qty 1

## 2020-04-22 MED ORDER — TRAMADOL HCL 50 MG PO TABS
50.0000 mg | ORAL_TABLET | Freq: Three times a day (TID) | ORAL | Status: DC | PRN
  Administered 2020-04-22 – 2020-04-30 (×9): 50 mg via ORAL
  Filled 2020-04-22 (×10): qty 1

## 2020-04-22 MED ORDER — ONDANSETRON HCL 4 MG/2ML IJ SOLN
4.0000 mg | Freq: Four times a day (QID) | INTRAMUSCULAR | Status: DC | PRN
  Administered 2020-04-26 – 2020-04-28 (×2): 4 mg via INTRAVENOUS
  Filled 2020-04-22 (×2): qty 2

## 2020-04-22 MED ORDER — APIXABAN 5 MG PO TABS
5.0000 mg | ORAL_TABLET | Freq: Two times a day (BID) | ORAL | Status: DC
  Administered 2020-04-22 (×2): 5 mg via ORAL
  Filled 2020-04-22 (×3): qty 1

## 2020-04-22 MED ORDER — SODIUM CHLORIDE 0.9% FLUSH: 3.0000 mL | INTRAVENOUS | Status: DC | PRN

## 2020-04-22 MED ORDER — FUROSEMIDE 20 MG PO TABS: 20.0000 mg | ORAL_TABLET | Freq: Every day | ORAL | Status: DC | PRN

## 2020-04-22 MED ORDER — MIRTAZAPINE 15 MG PO TABS
7.5000 mg | ORAL_TABLET | Freq: Every day | ORAL | Status: DC
Start: 2020-04-22 — End: 2020-05-04
  Administered 2020-04-22 – 2020-05-03 (×13): 7.5 mg via ORAL
  Filled 2020-04-22 (×14): qty 1

## 2020-04-22 MED ORDER — CALCITRIOL 0.25 MCG PO CAPS
0.5000 ug | ORAL_CAPSULE | Freq: Every day | ORAL | Status: DC
  Administered 2020-04-22 – 2020-04-23 (×2): 0.25 ug via ORAL
  Filled 2020-04-22: qty 2
  Filled 2020-04-22: qty 1
  Filled 2020-04-22: qty 2

## 2020-04-22 MED ORDER — DULOXETINE HCL 60 MG PO CPEP
60.0000 mg | ORAL_CAPSULE | Freq: Every day | ORAL | Status: DC
  Administered 2020-04-24 – 2020-05-04 (×11): 60 mg via ORAL
  Filled 2020-04-22 (×14): qty 1

## 2020-04-22 MED ORDER — SODIUM CHLORIDE 0.9% FLUSH
3.0000 mL | Freq: Two times a day (BID) | INTRAVENOUS | Status: DC
  Administered 2020-04-22: 3 mL via INTRAVENOUS

## 2020-04-22 MED ORDER — MAGNESIUM OXIDE 400 (241.3 MG) MG PO TABS
400.0000 mg | ORAL_TABLET | Freq: Every day | ORAL | Status: DC
  Administered 2020-04-22 – 2020-05-04 (×12): 400 mg via ORAL
  Filled 2020-04-22 (×14): qty 1

## 2020-04-22 MED ORDER — ACETAMINOPHEN 325 MG PO TABS
650.0000 mg | ORAL_TABLET | ORAL | Status: DC | PRN
  Administered 2020-04-22 – 2020-05-01 (×4): 650 mg via ORAL
  Filled 2020-04-22 (×5): qty 2

## 2020-04-22 MED ORDER — SODIUM CHLORIDE 0.9 % IV SOLN: 250.0000 mL | INTRAVENOUS | Status: DC | PRN

## 2020-04-22 MED ORDER — DEXTROSE 10 % IV SOLN
INTRAVENOUS | Status: DC
  Filled 2020-04-22 (×3): qty 1000

## 2020-04-22 MED ORDER — ASPIRIN 81 MG PO CHEW
81.0000 mg | CHEWABLE_TABLET | ORAL | Status: AC
  Administered 2020-04-23: 81 mg via ORAL
  Filled 2020-04-22: qty 1

## 2020-04-22 NOTE — H&P (Signed)
History and Physical    Jillian Eaton OYD:[id number] DOB: 01/24/1951 DOA: 04/21/2020  PCP: [name], MD  Patient coming from: Home via EMS   Chief Complaint:  Chief Complaint  Patient presents with  . Shortness of Breath     HPI:    70 year old female with past medical history of gastric bypass surgery, mild remittent asthma, postsurgical hypothyroidism, iatrogenic hypoparathyroidism, systolic and diastolic congestive heart failure (Echo 05/2019 EF 60-65%, up from 25-30% in 2018), chronic atrial fibrillation (on Eliquis, S/P AV nodal ablation 05/2019), status post pacemaker placement 05/2019, pulmonary hypertension, seizure disorder, chronic kidney disease stage IIIa, fibromyalgia, hypertension  who presents to [facility] emergency department with progressively worsening shortness of breath over the past 3 to 4 weeks.  Patient explains that between 3 and 4 weeks ago she began to experience mild shortness of breath.  Since then, shortness of breath has gradually worsened day by day and is becoming progressively more severe.  Patient states that she did have some bilateral lower extremity swelling that resolved somewhat over a week ago after taking a few extra doses of Lasix as an outpatient.  Patient denies any paroxysmal nocturnal dyspnea or pillow orthopnea.  Patient has describes her shortness of breath as severe in intensity, worse with exertion and improved with rest.  Patient denies any new cough, any evidence of chest pain, sick contacts, recent long distance travel.  Patient symptoms continued to worsen and she followed up as an outpatient with both her primary care provider Dr.  Cruzita Lederer and cardiologist Dr. [name] where she underwent a radiographic work-up including chest x-ray and CT angiogram of the chest as well as receiving a course of diuretics.  These interventions unfortunately did not result in improvement in her symptoms.    Patient eventually presents  to [facility] emergency department via EMS today due to experiencing sudden worsening of her shortness of breath this morning while at rest.  Upon evaluation in the emergency department chest x-ray reveals no evidence of acute disease.  BNP is only found to be minimally elevated at 167.9.  Troponin is found to be unremarkable at 4.  Due to ongoing symptoms and symptoms of shortness of breath despite lack of hypoxia at rest the hospitalist group has been called to assess the patient for admission to the hospital.  Review of Systems:   Review of Systems  Constitutional: Positive for weight loss.  Respiratory: Positive for shortness of breath.   Neurological: Positive for weakness.    Past Medical History:  Diagnosis Date  . Anemia    iron and pernicious  . Arthritis   . Asthma   . Cancer ([place])   . CHF (congestive heart failure) ([place]) 2018  . Complication of anesthesia    BP dropped   . DOE (dyspnea on exertion)    2D ECHO, 02/12/2012 - EF 60-65%, moderate concentric hypertrophy  . Dyspnea   . Dysrhythmia    a-fib  . Fibromyalgia    nerve pain"left side at waist level" "can't lay on that side without pain" , "HOB elevation helps"; pt. thinks this has resolved after 200lb weight loss9-17- since i lost the wieght , i dont  think i have this anymore   . Headache 04/2019  . Heart murmur   . Hematuria - cause not known    resolved   . History of COVID-19   . History of kidney stones    x 2 '13, '14 surgery to remove  .  Hypertension   . Pneumonia    aspiration  04/2016  . Presence of permanent cardiac pacemaker   . SBO (small bowel obstruction) ([place])    rqueired admission 2020  . Thyroid disease    "goiter"  . Transfusion history    10 yrs+    Past Surgical History:  Procedure Laterality Date  . ABDOMINAL ADHESION SURGERY  2004   open LOA - in [place]  . AV NODE ABLATION N/A 06/20/2019   Procedure: AV NODE ABLATION;  Surgeon: Evans Lance, MD;  Location: [place]  CV LAB;  Service: Cardiovascular;  Laterality: N/A;  . BALLOON DILATION N/A 07/25/2019   Procedure: BALLOON DILATION;  Surgeon: Carol Ada, MD;  Location: [facility] ENDOSCOPY;  Service: Endoscopy;  Laterality: N/A;  . CARDIAC CATHETERIZATION  04/04/2010   No significant obstructive coronary artery disease  . CARDIOVERSION N/A 08/08/2017   Procedure: CARDIOVERSION;  Surgeon: Pixie Casino, MD;  Location: [facility] ENDOSCOPY;  Service: Cardiovascular;  Laterality: N/A;  . CARDIOVERSION N/A 06/18/2019   Procedure: CARDIOVERSION;  Surgeon: Pixie Casino, MD;  Location: [place];  Service: Cardiovascular;  Laterality: N/A;  . CHOLECYSTECTOMY  1990  . COLONOSCOPY W/ POLYPECTOMY    . COLONOSCOPY WITH PROPOFOL N/A 04/10/2014   Procedure: COLONOSCOPY WITH PROPOFOL;  Surgeon: Beryle Beams, MD;  Location: [facility] ENDOSCOPY;  Service: Endoscopy;  Laterality: N/A;  . CYSTOSCOPY/URETEROSCOPY/HOLMIUM LASER/STENT PLACEMENT Right 08/13/2019   Procedure: CYSTOSCOPY/RETROGRADE/URETEROSCOPY/HOLMIUM LASER/STENT PLACEMENT;  Surgeon: Ceasar Mons, MD;  Location: [facility] ORS;  Service: Urology;  Laterality: Right;  ONLY NEEDS 45 MIN  . ESOPHAGOGASTRODUODENOSCOPY (EGD) WITH PROPOFOL N/A 07/25/2019   Procedure: ESOPHAGOGASTRODUODENOSCOPY (EGD) WITH PROPOFOL;  Surgeon: Carol Ada, MD;  Location: [facility] ENDOSCOPY;  Service: Endoscopy;  Laterality: N/A;  . EYE SURGERY     lasik 20-25 yrs ago  . GASTROPLASTY VERTICAL BANDED  1985  . [place]   In [place] for SBO  . MALONEY DILATION  07/25/2019   Procedure: Venia Minks DILATION;  Surgeon: Carol Ada, MD;  Location: [facility];  Service: Endoscopy;;  . PACEMAKER IMPLANT N/A 06/20/2019   Procedure: PACEMAKER IMPLANT;  Surgeon: Evans Lance, MD;  Location: [place] CV LAB;  Service: Cardiovascular;  Laterality: N/A;  . REVERSE SHOULDER ARTHROPLASTY Right 03/29/2018   Procedure: REVERSE SHOULDER ARTHROPLASTY;  Surgeon: Netta Cedars, MD;   Location: [place];  Service: Orthopedics;  Laterality: Right;  . ROUX-EN-Y GASTRIC BYPASS  1992   Conversion VBG to RnYGB in [place], [place]  . SHOULDER ARTHROSCOPY WITH SUBACROMIAL DECOMPRESSION  2016   Dr Berenice Primas, [place], [place]  . SMALL INTESTINE SURGERY  2000   SBO - LOA w SB resection  . TOTAL KNEE ARTHROPLASTY Left 11/14/2018   Procedure: TOTAL KNEE ARTHROPLASTY;  Surgeon: Paralee Cancel, MD;  Location: [facility] ORS;  Service: Orthopedics;  Laterality: Left;  70 mins  . TOTAL KNEE ARTHROPLASTY Right 04/29/2019   Procedure: TOTAL KNEE ARTHROPLASTY;  Surgeon: Paralee Cancel, MD;  Location: [facility] ORS;  Service: Orthopedics;  Laterality: Right;  70 mins  . TOTAL THYROIDECTOMY  06/26/2012   Dr Celine Ahr, [facility]  . TUBAL LIGATION  1986  . URETHRAL DILATION  2012   w cystoscopy.  Dr Gaynelle Arabian  . UTERINE FIBROID EMBOLIZATION       reports that she has never smoked. She has never used smokeless tobacco. She reports that she does not drink alcohol and does not use drugs.  Allergies  Allergen Reactions  . Oxycodone-Acetaminophen Itching, Nausea  And Vomiting, Rash and Other (See Comments)    Other reaction(s): Unknown  . Hydrocodone Itching         Family History  Problem Relation Age of Onset  . Diabetes Mother   . Epilepsy Mother   . Cancer Mother        Breast  . Hypertension Mother   . Breast cancer Mother   . Seizures Mother   . Kidney disease Father   . Diabetes Father   . Hypertension Father   . Asthma Father   . Heart disease Father   . Epilepsy Sister   . Cancer Maternal Grandmother   . Breast cancer Maternal Grandmother   . Cancer Paternal Grandmother   . Breast cancer Paternal Grandmother      Prior to Admission medications   Medication Sig Start Date End Date Taking? Authorizing Provider  acetaminophen (TYLENOL) 650 MG CR tablet Take 1,300 mg by mouth every 8 (eight) hours as needed for pain.   Yes [provider]  albuterol (VENTOLIN HFA) 108 (90 Base)  MCG/ACT inhaler Inhale 2 puffs into the lungs every 6 (six) hours as needed for wheezing or shortness of breath.   Yes [provider]  calcitRIOL (ROCALTROL) 0.25 MCG capsule Take 1 capsule (0.25 mcg total) by mouth daily. Patient taking differently: Take 0.5 mcg by mouth daily. 07/09/19  Yes Philemon Kingdom, MD  cyanocobalamin (,VITAMIN B-12,) 1000 MCG/ML injection Inject 1 mL (1,000 mcg total) into the muscle every 30 (thirty) days. 04/05/18  Yes Hennie Duos, MD  cyclobenzaprine (FLEXERIL) 10 MG tablet Take 20 mg by mouth at bedtime.    Yes [provider]  Docusate Sodium (DSS) 100 MG CAPS Take 100 mg by mouth daily.   Yes [provider]  DULoxetine (CYMBALTA) 60 MG capsule Take 1 capsule (60 mg total) by mouth daily. 04/05/18  Yes Hennie Duos, MD  ELIQUIS 5 MG TABS tablet TAKE 1 TABLET TWICE A DAY Patient taking differently: Take 5 mg by mouth 2 (two) times daily. 04/12/20  Yes Isaiah Serge, NP  eszopiclone (LUNESTA) 2 MG TABS tablet Take 1 tablet (2 mg total) by mouth at bedtime. 04/05/18  Yes Hennie Duos, MD  famotidine (PEPCID) 20 MG tablet Take 20 mg by mouth daily as needed for heartburn or indigestion.   Yes [provider]  FOLIC ACID PO Take 1 tablet by mouth daily.   Yes [provider]  furosemide (LASIX) 20 MG tablet Take 20 mg by mouth daily as needed for edema.   Yes [provider]  levothyroxine (SYNTHROID) 137 MCG tablet TAKE 1 TABLET (137 MCG TOTAL) BY MOUTH DAILY BEFORE BREAKFAST. 11/17/19  Yes Philemon Kingdom, MD  magnesium oxide (MAG-OX) 400 (241.3 Mg) MG tablet Take 400 mg by mouth daily.  04/29/19  Yes [provider]  mirtazapine (REMERON) 7.5 MG tablet Take 7.5 mg by mouth at bedtime.   Yes [provider]  montelukast (SINGULAIR) 10 MG tablet TAKE 1 TABLET BY MOUTH EVERYDAY AT BEDTIME Patient taking differently: Take 10 mg by mouth at bedtime. 03/08/20  Yes Chesley Mires, MD   potassium chloride (KLOR-CON) 10 MEQ tablet Take 10 mEq by mouth daily as needed (low potassium). 04/07/20  Yes [provider]  traMADol (ULTRAM) 50 MG tablet Take 50 mg by mouth 3 (three) times daily as needed for moderate pain.   Yes [provider]  Vitamin D, Ergocalciferol, (DRISDOL) 1.25 MG (50000 UT) CAPS capsule Take 1  capsule (50,000 Units total) by mouth every Wednesday. 04/10/18  Yes Hennie Duos, MD    Physical Exam: Vitals:   04/21/20 2230 04/21/20 2300 04/21/20 2330 04/21/20 2334  BP: (!) 105/94   108/75  Pulse: (!) 59 74 (!) 59 60  Resp: 17 19 16 14   Temp:      TempSrc:      SpO2: 100% 100% 100% 100%    Constitutional: Acute alert and oriented x3, some evidence of temporal wasting. Skin: no rashes, no lesions, poor skin turgor noted. Eyes: Pupils are equally reactive to light.  No evidence of scleral icterus or conjunctival pallor.  ENMT: Moist mucous membranes noted.  Posterior pharynx clear of any exudate or lesions.   Neck: normal, supple, no masses, no thyromegaly.  No evidence of jugular venous distension.   Respiratory: clear to auscultation bilaterally, no wheezing, no crackles. Normal respiratory effort. No accessory muscle use.  Cardiovascular: Regular rate and rhythm, no murmurs / rubs / gallops. No extremity edema. 2+ pedal pulses. No carotid bruits.  Chest:   Nontender without crepitus or deformity.   Back:   Nontender without crepitus or deformity. Abdomen: Abdomen is soft and nontender.  No evidence of intra-abdominal masses.  Positive bowel sounds noted in all quadrants.   Musculoskeletal: No joint deformity upper and lower extremities. Good ROM, no contractures. Normal muscle tone.  Neurologic: CN 2-12 grossly intact. Sensation intact.  Patient moving all 4 extremities spontaneously.  Patient is following all commands.  Patient is responsive to verbal stimuli.   Psychiatric: Patient exhibits anxious mood with appropriate affect..   Patient seems to possess insight as to their current situation.     Labs on Admission: I have personally reviewed following labs and imaging studies -   CBC: Recent Labs  Lab 04/21/20 1556  WBC 4.7  NEUTROABS 3.2  HGB 10.5*  HCT 33.8*  MCV 99.7  PLT 702   Basic Metabolic Panel: Recent Labs  Lab 04/21/20 1556  NA 142  K 4.2  CL 110  CO2 23  GLUCOSE 65*  BUN 26*  CREATININE 1.27*  CALCIUM 6.9*   GFR: Estimated Creatinine Clearance: 46.2 mL/min (A) (by C-G formula based on SCr of 1.27 mg/dL (H)). Liver Function Tests: No results for input(s): AST, ALT, ALKPHOS, BILITOT, PROT, ALBUMIN in the last 168 hours. No results for input(s): LIPASE, AMYLASE in the last 168 hours. No results for input(s): AMMONIA in the last 168 hours. Coagulation Profile: No results for input(s): INR, PROTIME in the last 168 hours. Cardiac Enzymes: No results for input(s): CKTOTAL, CKMB, CKMBINDEX, TROPONINI in the last 168 hours. BNP (last 3 results) Recent Labs    06/13/19 1429 04/02/20 0941  PROBNP 251.0* 186.0*   HbA1C: No results for input(s): HGBA1C in the last 72 hours. CBG: Recent Labs  Lab 04/21/20 1848  GLUCAP 106*   Lipid Profile: No results for input(s): CHOL, HDL, LDLCALC, TRIG, CHOLHDL, LDLDIRECT in the last 72 hours. Thyroid Function Tests: No results for input(s): TSH, T4TOTAL, FREET4, T3FREE, THYROIDAB in the last 72 hours. Anemia Panel: No results for input(s): VITAMINB12, FOLATE, FERRITIN, TIBC, IRON, RETICCTPCT in the last 72 hours. Urine analysis:    Component Value Date/Time   COLORURINE YELLOW 01/03/2019 2248   APPEARANCEUR HAZY (A) 01/03/2019 2248   LABSPEC 1.026 01/03/2019 2248   PHURINE 5.0 01/03/2019 2248   GLUCOSEU NEGATIVE 01/03/2019 2248   HGBUR NEGATIVE 01/03/2019 2248   BILIRUBINUR NEGATIVE 01/03/2019 2248   KETONESUR 5 (A) 01/03/2019  [place] 01/03/2019 2248   UROBILINOGEN 1.0 06/06/2013 2154   NITRITE NEGATIVE 01/03/2019  2248   LEUKOCYTESUR TRACE (A) 01/03/2019 2248    Radiological Exams on Admission - Personally Reviewed: DG Chest 2 View  Result Date: 04/21/2020 CLINICAL DATA:  Shortness of breath for 1 day EXAM: CHEST - 2 VIEW COMPARISON:  04/01/2020 FINDINGS: Pacing device is again noted. Cardiac enlargement is again seen and stable. The lungs are clear bilaterally. No acute bony abnormality is seen. Right shoulder replacement is noted. IMPRESSION: No acute abnormality noted.  Stable cardiomegaly is seen. Electronically Signed   By: Inez Catalina M.D.   On: 04/21/2020 16:54    EKG: Personally reviewed.  Rhythm is paced rhythm with heart rate of 60 bpm.  No dynamic ST segment changes appreciated.  Assessment/Plan Principal Problem:   Shortness of breath   Patient presenting with what she states is a 4-week history of progressively worsening shortness of breath although patient has unfortunately battled with waxing and waning shortness of breath over the course of several years.  Symptoms seem to be particularly severe as of late however.  It is worth noting that patient did get diagnosed with COVID-19 as an outpatient in early January.  Patient states that her symptoms quickly resolved after several days and she was managed as an outpatient.  Clinically, patient exhibits no evidence of volume overload suggest patient is suffering from acute congestive heart failure.  Patient was evaluated by her cardiologist on 2/28 and they agree that patient is currently euvolemic.  Chest imaging reveals no evidence of pneumonia  Furthermore chest imaging reveals no evidence of acute pulmonary embolism which was performed despite the patient being compliant with her Eliquis therapy  No evidence of arrhythmias on EKG as this reveals a paced rhythm since the patient underwent AV nodal ablation with pacemaker placement 05/2019.  No clinical evidence of asthma exacerbation.  Will monitor patient overnight on telemetry and  attempt to wean patient off of her supplemental oxygen she is currently on 3 L via nasal cannula.  I do not believe that she actually needs it at rest.  Cycling cardiac enzymes  Obtaining room air ABG  Obtaining echocardiogram in the morning -patient was to undergo repeat echocardiogram later this month anyway at the direction of cardiology for work-up of her shortness of breath.  Patient would benefit from walking oxygen assessment tomorrow to determine if patient truly needs supplemental oxygen with exertion.  If patient's work-up is negative I believe that most likely cause of the patient's progressive symptoms is progression of her known history of pulmonary hypertension.  Patient would likely ultimately benefit from a right heart catheterization as echocardiograms can be quite inaccurate in evaluating this.  Has seen a provider at [facility] several years ago who last performed a right heart catheterization.  According to cardiology's notes patient was never placed on treatment for this.  Active Problems:   Unintentional weight loss   Patient has been experiencing significant weight loss particularly over the past several months.  This is possibly secondary to chronic malnutrition due to history of gastric bypass surgery  Repeating TSH  Placing nutrition consultation  Mammogram performed in 01/2020 was normal  Patient is to undergo upper and lower endoscopy in several days as an outpatient with her gastroenterologist to rule out GI malignancy.    Pulmonary HTN ([place])   Not currently on treatment  Please see assessment and plan above    Chronic  atrial fibrillation (HCC)   Paced rhythm on telemetry  Not requiring any AV nodal blocking agents    Postoperative hypothyroidism   Continue Synthroid  Checking TSH    Iatrogenic hypocalcemia   Continuing home regimen of calcitriol    Chronic combined systolic and diastolic congestive heart failure  (HCC)   No evidence of cardiogenic volume overload    Mild intermittent asthma, uncomplicated   No clinical evidence of asthma exacerbation  As needed bronchodilator therapy for shortness of breath or wheezing    Chronic kidney disease, stage 3a ([place])  .  Strict intake and output monitoring . Creatinine near baseline . Minimizing nephrotoxic agents as much as possible . Serial chemistries to monitor renal function and electrolytes     Code Status:  Full code Family Communication: deferred   Status is: Observation  The patient remains OBS appropriate and will d/c before 2 midnights.  Dispo: The patient is from: Home              Anticipated d/c is to: Home              Patient currently is not medically stable to d/c.   Difficult to place patient No        Vernelle Emerald MD [facility] Pager [phone]  If 7PM-7AM, please contact night-coverage [email or web address] Use universal [place] password for that web site. If you do not have the password, please call the hospital operator.  04/22/2020, 1:33 AM

## 2020-04-22 NOTE — H&P (View-Only) (Signed)
Cardiology Consultation:   Patient ID: Jillian Eaton [facility] MRN: [id number]; DOB: Sep 25, 1950  Admit date: 04/21/2020 Date of Consult: 04/22/2020  PCP:  [name], MD   [place]  Cardiologist:  Pixie Casino, MD  Electrophysiologist:  Cristopher Peru, MD   Patient Profile:   Jillian Eaton is a 70 y.o. female with a history of no significant CAD on cardiac catheterization in 2012, chronic combined CHF with EF as low as 25-30% in 2018 but improved to 60-65 on Echo in 05/2019, chronic atrial fibrillation s/p AV nodal ablation, complete heart block s/p PPM placement in 05/2019 on Eliquis, obstructive sleep apnea with nocturnal hypoxia, pulmonary hypertension, asthma, hypertension, post-surgical hypothyroidism, iatrogenic hypoparathyroidism, CKD stage III, fibromyalgia, seizure disorder, and obesity s/p gastric bypass in 1992 who is being seen today for the evaluation of worsening shortness of breath at the request of Dr. Lupita Leash.  History of Present Illness:   Jillian Eaton is a 70 year old female with the above history who if followed by Dr. [name]. Patient has long history of dyspnea that has worsened lately. Last PFTs in 02/2019 showed FVC 1.73 (61% predicted), postbronchodilator ratio 83, postbronchodilator FEV1 1.44 (65% predicted), positive bronchodilator response in FEV1, reversibility with ratio as well as mid flow reversibility, DLCO 15.87 (72% predicted). She was seen by Pulmonology on 04/01/2020 and was not felt to have an acute pulmonary cause for her dyspnea. She was seen by Jillian Memos, NP, on 04/07/2020 and was felt to be volume overloaded. She was given a trail of increased diuretics. Chest CTA on 08/08/2020 was negative for PE but did show bilateral pleural effusions and pulmonary hypertension. She was seen for follow-up by Dr. [name] on 04/19/2020 at which time she did feel like the increase in diuretic helped her breathing some. However, she continued  to note unexpected weight loss with significant anorexia, nausea, and vomiting as well as food getting stuck. Weight was down 20 lbs since August 2021. She also complained of some discomfort around her pacemaker site which was felt to be due to significant weight loss. She was not felt to be volume overloaded. Main issue was felt to be significant weight loss. She was scheduled to undergo endoscopy on 04/23/2020 with Dr. Benson Norway.  Patient presented to the ED yesterday via EMS for further evaluation of shortness of breath and syncope.  Patient reports worsening dyspnea since the end of January/early February.  She states this is mostly with exertion.  Dyspnea has significantly progressed over that time and now she has shortness of breath with minimal activity such as cooking.  No significant shortness of breath at rest.  No orthopnea, PND, or significant edema. She was recently started on home O2. This was delivered 2 days ago and she has been on 2-3L basically 24/7 since that time. Of note, she was diagnosed with COVID at the beginning of January but symptoms were mild and did not last long and then she returned to baseline.  He also reports significant fatigue and decreased exercise tolerance since the end of January but especially over the last 2 weeks.  She can no longer go shopping for groceries because she gets so fatigued.  She notes a very slight chest discomfort that she describes as a "fullness all the time."  No real pain.  He does note some occasional palpitations especially when she gets acutely short of breath.  She also notes some lightheadedness/dizziness.  Yesterday, she got acutely short of breath while  sitting down cooking and she had associated lightheadedness/dizziness and near syncope with this.  She states she felt very close to passing out but denies any overt syncope.  She called 911 because she felt so bad.  She also continues to have significant GI issues including anorexia, nausea/vomiting.  No abnormal bleeding in urine or stools.   When EMS arrive, O2 sats reportedly 72% on home O2. Upon arrival to the ED, patient hypotensive with BP of 84/20. O2 sats 100% on 3L via nasal cannula. EKG showed ventricular paced rhythm. High-sensitivity troponin negative. BNP 167. Chest x-ray showed no acute findings. WBC 4.7, Hgb 10.5, Plts 175. Na 142, K 4.2, Glucose 65, BUN 26, Cr 1.27, Calcium 6.9. COVID-19 testing negative. Patient was admitted under Internal Medicine service for further evaluation and Cardiology was consulted for assistance.   At the time of this evaluation, she is eating lunch. Breathing comfortably. She denies any history of tobacco abuse.   Of note, patient has had problems with hypoglycemia this admission.  Past Medical History:  Diagnosis Date  . Anemia    iron and pernicious  . Arthritis   . Asthma   . Cancer ([place])   . CHF (congestive heart failure) ([place]) 2018  . Complication of anesthesia    BP dropped   . DOE (dyspnea on exertion)    2D ECHO, 02/12/2012 - EF 60-65%, moderate concentric hypertrophy  . Dyspnea   . Dysrhythmia    a-fib  . Fibromyalgia    nerve pain"left side at waist level" "can't lay on that side without pain" , "HOB elevation helps"; pt. thinks this has resolved after 200lb weight loss9-17- since i lost the wieght , i dont  think i have this anymore   . Headache 04/2019  . Heart murmur   . Hematuria - cause not known    resolved   . History of COVID-19   . History of kidney stones    x 2 '13, '14 surgery to remove  . Hypertension   . Pneumonia    aspiration  04/2016  . Presence of permanent cardiac pacemaker   . SBO (small bowel obstruction) ([place])    rqueired admission 2020  . Thyroid disease    "goiter"  . Transfusion history    10 yrs+    Past Surgical History:  Procedure Laterality Date  . ABDOMINAL ADHESION SURGERY  2004   open LOA - in [place]  . AV NODE ABLATION N/A 06/20/2019   Procedure: AV NODE ABLATION;  Surgeon: Evans Lance, MD;  Location: [place] [facility];  Service: Cardiovascular;  Laterality: N/A;  . BALLOON DILATION N/A 07/25/2019   Procedure: BALLOON DILATION;  Surgeon: Carol Ada, MD;  Location: [facility];  Service: Endoscopy;  Laterality: N/A;  . CARDIAC CATHETERIZATION  04/04/2010   No significant obstructive coronary artery disease  . CARDIOVERSION N/A 08/08/2017   Procedure: CARDIOVERSION;  Surgeon: Pixie Casino, MD;  Location: [facility] ENDOSCOPY;  Service: Cardiovascular;  Laterality: N/A;  . CARDIOVERSION N/A 06/18/2019   Procedure: CARDIOVERSION;  Surgeon: Pixie Casino, MD;  Location: [place];  Service: Cardiovascular;  Laterality: N/A;  . CHOLECYSTECTOMY  1990  . COLONOSCOPY W/ POLYPECTOMY    . COLONOSCOPY WITH PROPOFOL N/A 04/10/2014   Procedure: COLONOSCOPY WITH PROPOFOL;  Surgeon: Beryle Beams, MD;  Location: [facility];  Service: Endoscopy;  Laterality: N/A;  . CYSTOSCOPY/URETEROSCOPY/HOLMIUM LASER/STENT PLACEMENT Right 08/13/2019   Procedure: CYSTOSCOPY/RETROGRADE/URETEROSCOPY/HOLMIUM LASER/STENT PLACEMENT;  Surgeon: Ceasar Mons, MD;  Location:  [facility];  Service: Urology;  Laterality: Right;  ONLY NEEDS 45 MIN  . ESOPHAGOGASTRODUODENOSCOPY (EGD) WITH PROPOFOL N/A 07/25/2019   Procedure: ESOPHAGOGASTRODUODENOSCOPY (EGD) WITH PROPOFOL;  Surgeon: Carol Ada, MD;  Location: [facility];  Service: Endoscopy;  Laterality: N/A;  . EYE SURGERY     lasik 20-25 yrs ago  . GASTROPLASTY VERTICAL BANDED  1985  . [place]   In [place] for SBO  . MALONEY DILATION  07/25/2019   Procedure: Venia Minks DILATION;  Surgeon: Carol Ada, MD;  Location: [facility];  Service: Endoscopy;;  . PACEMAKER IMPLANT N/A 06/20/2019   Procedure: PACEMAKER IMPLANT;  Surgeon: Evans Lance, MD;  Location: [place] [facility];  Service: Cardiovascular;  Laterality: N/A;  . REVERSE SHOULDER ARTHROPLASTY Right 03/29/2018   Procedure: REVERSE SHOULDER  ARTHROPLASTY;  Surgeon: Netta Cedars, MD;  Location: [place];  Service: Orthopedics;  Laterality: Right;  . ROUX-EN-Y GASTRIC BYPASS  1992   Conversion VBG to RnYGB in [place], [place]  . SHOULDER ARTHROSCOPY WITH SUBACROMIAL DECOMPRESSION  2016   Dr Berenice Primas, [place], [place]  . SMALL INTESTINE SURGERY  2000   SBO - LOA w SB resection  . TOTAL KNEE ARTHROPLASTY Left 11/14/2018   Procedure: TOTAL KNEE ARTHROPLASTY;  Surgeon: Paralee Cancel, MD;  Location: [facility];  Service: Orthopedics;  Laterality: Left;  70 mins  . TOTAL KNEE ARTHROPLASTY Right 04/29/2019   Procedure: TOTAL KNEE ARTHROPLASTY;  Surgeon: Paralee Cancel, MD;  Location: [facility];  Service: Orthopedics;  Laterality: Right;  70 mins  . TOTAL THYROIDECTOMY  06/26/2012   Dr Celine Ahr, [facility]  . TUBAL LIGATION  1986  . URETHRAL DILATION  2012   w cystoscopy.  Dr Gaynelle Arabian  . UTERINE FIBROID EMBOLIZATION       Home Medications:  Prior to Admission medications   Medication Sig Start Date End Date Taking? Authorizing Provider  acetaminophen (TYLENOL) 650 MG CR tablet Take 1,300 mg by mouth every 8 (eight) hours as needed for pain.   Yes [provider]  albuterol (VENTOLIN HFA) 108 (90 Base) MCG/ACT inhaler Inhale 2 puffs into the lungs every 6 (six) hours as needed for wheezing or shortness of breath.   Yes [provider]  calcitRIOL (ROCALTROL) 0.25 MCG capsule Take 1 capsule (0.25 mcg total) by mouth daily. Patient taking differently: Take 0.5 mcg by mouth daily. 07/09/19  Yes Philemon Kingdom, MD  cyanocobalamin (,VITAMIN B-12,) 1000 MCG/ML injection Inject 1 mL (1,000 mcg total) into the muscle every 30 (thirty) days. 04/05/18  Yes Hennie Duos, MD  cyclobenzaprine (FLEXERIL) 10 MG tablet Take 20 mg by mouth at bedtime.    Yes [provider]  Docusate Sodium (DSS) 100 MG CAPS Take 100 mg by mouth daily.   Yes [provider]  DULoxetine (CYMBALTA) 60 MG capsule Take 1 capsule (60 mg total)  by mouth daily. 04/05/18  Yes Hennie Duos, MD  ELIQUIS 5 MG TABS tablet TAKE 1 TABLET TWICE A DAY Patient taking differently: Take 5 mg by mouth 2 (two) times daily. 04/12/20  Yes Isaiah Serge, NP  eszopiclone (LUNESTA) 2 MG TABS tablet Take 1 tablet (2 mg total) by mouth at bedtime. 04/05/18  Yes Hennie Duos, MD  famotidine (PEPCID) 20 MG tablet Take 20 mg by mouth daily as needed for heartburn or indigestion.   Yes [provider]  FOLIC ACID PO Take 1 tablet by mouth daily.   Yes [provider]  furosemide (LASIX) 20 MG tablet Take 20 mg by mouth daily as needed for edema.   Yes [provider]  levothyroxine (SYNTHROID) 137 MCG tablet TAKE 1 TABLET (137 MCG TOTAL) BY MOUTH DAILY BEFORE BREAKFAST. 11/17/19  Yes Philemon Kingdom, MD  magnesium oxide (MAG-OX) 400 (241.3 Mg) MG tablet Take 400 mg by mouth daily.  04/29/19  Yes [provider]  mirtazapine (REMERON) 7.5 MG tablet Take 7.5 mg by mouth at bedtime.   Yes [provider]  montelukast (SINGULAIR) 10 MG tablet TAKE 1 TABLET BY MOUTH EVERYDAY AT BEDTIME Patient taking differently: Take 10 mg by mouth at bedtime. 03/08/20  Yes Chesley Mires, MD  potassium chloride (KLOR-CON) 10 MEQ tablet Take 10 mEq by mouth daily as needed (low potassium). 04/07/20  Yes [provider]  traMADol (ULTRAM) 50 MG tablet Take 50 mg by mouth 3 (three) times daily as needed for moderate pain.   Yes [provider]  Vitamin D, Ergocalciferol, (DRISDOL) 1.25 MG (50000 UT) CAPS capsule Take 1 capsule (50,000 Units total) by mouth every Wednesday. 04/10/18  Yes Hennie Duos, MD    Inpatient Medications: Scheduled Meds: . apixaban  5 mg Oral BID  . calcitRIOL  0.5 mcg Oral Daily  . [START ON 04/23/2020] cosyntropin  0.25 mg Intravenous Once  . DULoxetine  60 mg Oral Daily  . folic acid  1 mg Oral Daily  . levothyroxine  137 mcg Oral Q0600  . magnesium oxide  400 mg Oral Daily  .  methylPREDNISolone (SOLU-MEDROL) injection  125 mg Intravenous Once  . mirtazapine  7.5 mg Oral QHS  . montelukast  10 mg Oral QHS   Continuous Infusions: . dextrose     PRN Meds: acetaminophen, albuterol, famotidine, furosemide, ondansetron **OR** ondansetron (ZOFRAN) IV, polyethylene glycol, traMADol, zolpidem  Allergies:    Allergies  Allergen Reactions  . Oxycodone-Acetaminophen Itching, Nausea And Vomiting, Rash and Other (See Comments)    Other reaction(s): Unknown  . Hydrocodone Itching         Social History:   Social History   Socioeconomic History  . Marital status: Married    Spouse name: Not on file  . Number of children: 3  . Years of education: Not on file  . Highest education level: Master's degree (e.g., MA, MS, MEng, MEd, MSW, MBA)  Occupational History  . Occupation: retired  Tobacco Use  . Smoking status: Never Smoker  . Smokeless tobacco: Never Used  Vaping Use  . Vaping Use: Never used  Substance and Sexual Activity  . Alcohol use: No    Comment: wine occ  . Drug use: No    Types: Oxycodone    Comment: perscribed  . Sexual activity: Not Currently    Birth control/protection: None  Other Topics Concern  . Not on file  Social History Narrative   Right handed   1 cup of caffeine per day        Social Determinants of Health   Financial Resource Strain: Not on file  Food Insecurity: Not on file  Transportation Needs: Not on file  Physical Activity: Not on file  Stress: Not on file  Social Connections: Not on file  Intimate Partner Violence: Not on file    Family History:    Family History  Problem Relation Age of Onset  . Diabetes Mother   . Epilepsy Mother   . Cancer Mother        Breast  . Hypertension Mother   .  Breast cancer Mother   . Seizures Mother   . Kidney disease Father   . Diabetes Father   . Hypertension Father   . Asthma Father   . Heart disease Father   . Epilepsy Sister   . Cancer Maternal Grandmother   .  Breast cancer Maternal Grandmother   . Cancer Paternal Grandmother   . Breast cancer Paternal Grandmother      ROS:  Please see the history of present illness.  All other ROS reviewed and negative.     Physical Exam/Data:   Vitals:   04/22/20 1145 04/22/20 1200 04/22/20 1215 04/22/20 1304  BP: 119/87 134/86 129/84 108/76  Pulse: (!) 59 (!) 59 (!) 59 61  Resp: 18 20 (!) 21 16  Temp:    (!) 97.3 F (36.3 C)  TempSrc:      SpO2: 100% 100% 100% 100%    Intake/Output Summary (Last 24 hours) at 04/22/2020 1532 Last data filed at 04/21/2020 2041 Gross per 24 hour  Intake 1000 ml  Output -  Net 1000 ml   Last 3 Weights 04/19/2020 04/16/2020 04/07/2020  Weight (lbs) 171 lb 12.8 oz 174 lb 185 lb 12.8 oz  Weight (kg) 77.928 kg 78.926 kg 84.278 kg     There is no height or weight on file to calculate BMI.  General: 70 y.o. female resting comfortably in no acute distress. HEENT: Normocephalic and atraumatic. Sclera clear.  Neck: Supple. No carotid bruits. No JVD. Heart: RRR. Distinct S1 and S2. No murmurs, gallops, or rubs. Radial pulses 2+ and equal bilaterally. Lungs: No increased work of breathing. Clear to ausculation bilaterally. No wheezes, rhonchi, or rales.  Abdomen: Soft, non-distended, and non-tender to palpation. Bowel sounds present. Extremities: No significant lower extremity edema.    Skin: Warm and dry. Neuro: Alert and oriented x3. No focal deficits. Psych: Normal affect. Responds appropriately.  EKG:  The EKG was personally reviewed and demonstrates: Ventricular paced rhythm, rate 60 bpm, with no acute ischemic changes.  Telemetry:  Telemetry was personally reviewed and demonstrates:  Ventricular paced with rates in the 60's.  Relevant CV Studies:  Echocardiogram 06/14/2019: Impressions: 1. Left ventricular ejection fraction, by estimation, is 60 to 65%. The  left ventricle has normal function. The left ventricle has no regional  wall motion abnormalities. Left  ventricular diastolic parameters are  indeterminate.  2. Right ventricular systolic function is normal. The right ventricular  size is mildly enlarged. There is moderately elevated pulmonary artery  systolic pressure. The estimated right ventricular systolic pressure is  01.6 mmHg.  3. Left atrial size was moderately dilated.  4. Right atrial size was moderately dilated.  5. The mitral valve is normal in structure. Mild mitral valve  regurgitation. No evidence of mitral stenosis.  6. Tricuspid valve regurgitation is moderate.  7. The aortic valve is tricuspid. Aortic valve regurgitation is trivial.  Mild aortic valve sclerosis is present, with no evidence of aortic valve  stenosis.  8. The inferior vena cava is normal in size with greater than 50%  respiratory variability, suggesting right atrial pressure of 3 mmHg.   Comparison(s): No significant change from prior study. Prior images  reviewed side by side.  Laboratory Data:  High Sensitivity Troponin:   Recent Labs  Lab 04/21/20 1556 04/22/20 0500  TROPONINIHS 4 5     Chemistry Recent Labs  Lab 04/21/20 1556 04/22/20 0306 04/22/20 0402  NA 142 141 142  K 4.2 3.9 3.9  CL 110 112*  --  CO2 23 22  --   GLUCOSE 65* 67*  --   BUN 26* 26*  --   CREATININE 1.27* 1.20*  --   CALCIUM 6.9* 6.3*  --   GFRNONAA 46* 50*  --   ANIONGAP 9 7  --     Recent Labs  Lab 04/22/20 0306  PROT 4.8*  ALBUMIN 2.0*  AST 21  ALT 24  ALKPHOS 56  BILITOT 0.8   Hematology Recent Labs  Lab 04/21/20 1556 04/22/20 0306 04/22/20 0402  WBC 4.7 3.8*  --   RBC 3.39* 2.81*  --   HGB 10.5* 9.0* 8.8*  HCT 33.8* 27.8* 26.0*  MCV 99.7 98.9  --   MCH 31.0 32.0  --   MCHC 31.1 32.4  --   RDW 16.6* 16.6*  --   PLT 175 149*  --    BNP Recent Labs  Lab 04/21/20 1556  BNP 167.9*    DDimer No results for input(s): DDIMER in the last 168 hours.   Radiology/Studies:  DG Chest 2 View  Result Date: 04/21/2020 CLINICAL DATA:   Shortness of breath for 1 day EXAM: CHEST - 2 VIEW COMPARISON:  04/01/2020 FINDINGS: Pacing device is again noted. Cardiac enlargement is again seen and stable. The lungs are clear bilaterally. No acute bony abnormality is seen. Right shoulder replacement is noted. IMPRESSION: No acute abnormality noted.  Stable cardiomegaly is seen. Electronically Signed   By: Inez Catalina M.D.   On: 04/21/2020 16:54     Assessment and Plan:   Dyspnea  Chronic Combined CHF Pulmonary Hypertension Asthma - Patient has a long history of dyspnea which has worsened lately. Recently seen by both Pulmonology and Cardiology with unremarkable work-up. Chest CTA was negative for PE but did show small bilateral pleural effusion and evidence of pulmonary hypertension. She does have a history of chronic combined CHF but was felt to be euvolemic at visit with Cardiology on 04/19/2020. She ultimately presented to the ED due to worsening shortness of breath with O2 sats in the 70's. - BNP was mildly elevated at 167.  - Chest x-ray showed no acute findings.  - Last Echo from 05/2019 showed LVEF of 60-65% with normal wall motion, biatrial enlargement, mild MR, moderate TR. RV mildly enlarged with normal systolic function and moderately elevated PASP of 50.7 mmHg.  - Will update Echo.  - Appears euvolemic on exam.  - Will hold off on diuresis for now.  - Reviewed with Dr. Audie Box. Dyspnea felt to likely be due to pulmonary hypertension secondary to lung disease. Will plan for right/left cardiac catheterization tomorrow. The patient understands that risks include but are not limited to stroke (1 in 1000), death (1 in 41), kidney failure [usually temporary] (1 in 500), bleeding (1 in 200), allergic reaction [possibly serious] (1 in 200), and agrees to proceed.   Chronic Atrial Fibrillation Complete Heart Block S/p AV Nodal Ablation and [employer] PPM Placement in 05/2019 - Not on any AV nodal agents. - Continue chronic  anticoagulation with Eliquis 5mg  twice daily. - Followed by Dr. Lovena Le.  Hypotension - History of hypertension but patient was hypotensive on presentation. Systolic BP as low as 31/49. Received IV fluids bolus and now BP much improved. Systolic BP ranging from the low 100's to 130's.  - Not on any antihypertensives. - Continue to monitor closely.  Unintentional Weight Loss Nausea/Vomiting - Patient has had significant weight loss as well as nausea/vomting over the last several months. Possibly secondary to chronic  malnutrition due to prior gastric bypass surgery. - Nutrition consult already placed. - Patient was scheduled for upper and lower endoscopy tomorrow to rule out GI malignancy but looks like this has now been postponed due to acute issues. - Management per primary team.  Otherwise, per primary team: - Post surgical hypothyroidism - Iatrogenic hypocalcemia - Mild intermittent asthma - Hypoglycemia    Risk Assessment/Risk Scores:   New York Heart Association (NYHA) Functional Class NYHA Class III. Suspect secondary to pulmonary issues rather than CHF.   For questions or updates, please contact [place] Please consult [email or web address] for contact info under    Signed, Darreld Mclean, PA-C  04/22/2020 3:32 PM

## 2020-04-22 NOTE — Progress Notes (Signed)
Glucose 51, pt eating her meal at this time. MD at pt's bedside talking with her. Encouraged pt to eat and drink to increase glucose.

## 2020-04-22 NOTE — Plan of Care (Signed)
  Problem: Education: Goal: Knowledge of General Education information will improve Description: Including pain rating scale, medication(s)/side effects and non-pharmacologic comfort measures Outcome: Progressing   Problem: Education: Goal: Ability to describe self-care measures that may prevent or decrease complications (Diabetes Survival Skills Education) will improve Outcome: Progressing Goal: Individualized Educational Video(s) Outcome: Progressing   Problem: Coping: Goal: Ability to adjust to condition or change in health will improve Outcome: Progressing   Problem: Fluid Volume: Goal: Ability to maintain a balanced intake and output will improve Outcome: Progressing   Problem: Health Behavior/Discharge Planning: Goal: Ability to identify and utilize available resources and services will improve Outcome: Progressing Goal: Ability to manage health-related needs will improve Outcome: Progressing   Problem: Metabolic: Goal: Ability to maintain appropriate glucose levels will improve Outcome: Progressing   Problem: Nutritional: Goal: Maintenance of adequate nutrition will improve Outcome: Progressing Goal: Progress toward achieving an optimal weight will improve Outcome: Progressing   Problem: Skin Integrity: Goal: Risk for impaired skin integrity will decrease Outcome: Progressing   Problem: Tissue Perfusion: Goal: Adequacy of tissue perfusion will improve Outcome: Progressing

## 2020-04-22 NOTE — Progress Notes (Signed)
  Echocardiogram 2D Echocardiogram has been performed.  Luisa Hart RDCS 04/22/2020, 4:02 PM

## 2020-04-22 NOTE — Progress Notes (Signed)
Patient seen reviewed the chart, history and physical and admission note, done by admitting physician this morning and agree with the same with following addendum.  Please refer to the morning admission note for more detailed plan of care.  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  She reports seeing PCP and cardiologist recently, underwent radiographic work-up chest x-ray CT angio of the chest and received course of diuretics, but did not resolve her symptoms.  In the ED chest x-ray no evidence of acute abnormalities BNP minimally elevated 167 troponin at 4. She was found to have leg edema. Patient complains of being very weak, tired, short of breath at rest and with activity, chest fullness. She reports she was placed on oxygen by her PCP few days ago  On exam she is alert and winded x3 not in acute distress appears oral for her age and appears chronically ill Mild edema in lower extremities lungs are clear on exam abdomen soft nontender nondistended  Shortness of breath fatigue/dyspnea with activity going on for few weeks: Chest x-ray BNP unremarkable, cardiac enzymes negative.  Obtain echocardiogram, TSH nl. Has mild leg edema.  She may benefit from right heart catheterization?, ? For low sugar- d10 if not eatign well. Cardiology consulted for further work-up management  Unintentional weight loss for past several months follow-up TSH had mammogram in December/21 abnormal.  She is to undergo upper and  lower endoscopy in several days as an outpatient with her gastroenterologist told her GI malignancy.  Hypoglycemia-sugar in 40-60s. Check cbg q2 hr, add D10, ?cause-not eating much-liberalize diet.Check Cosyntropin test in am.Hx of gastric bypass surgeryx2 and very particular about food.  Pulmonary hypertension not currently on treatment Chronic atrial fibrillation paced rhythm on telemetry Postoperative hypothyroidism on Synthroid.tsh stable Iatrogenic hypocalcemia continue on her calcitriol monitor level corrected calcium 7.9-8.5 as albumin is low at 2, add calcium supplement. Chronic combined systolic and diastolic CHF, cardiology consulted Mild intermittent asthma no exacerbation not wheezing. CKD stage IIIa monitor renal function closely, BUN/creatinine 26/1.2 Hypomagnesemia 1.6 add mag oxide.

## 2020-04-22 NOTE — Consult Note (Signed)
Cardiology Consultation:   Patient ID: Jillian Eaton [facility] MRN: [id number]; DOB: [birth year]  Admit date: 04/21/2020 Date of Consult: 04/22/2020  PCP:  Jillian Hose, MD   [place]  Cardiologist:  Jillian Casino, MD  Electrophysiologist:  Jillian Peru, MD   Patient Profile:   Jillian Eaton is a 70 y.o. female with a history of no significant CAD on cardiac catheterization in 2012, chronic combined CHF with EF as low as 25-30% in 2018 but improved to 60-65 on Echo in 05/2019, chronic atrial fibrillation s/p AV nodal ablation, complete heart block s/p PPM placement in 05/2019 on Eliquis, obstructive sleep apnea with nocturnal hypoxia, pulmonary hypertension, asthma, hypertension, post-surgical hypothyroidism, iatrogenic hypoparathyroidism, CKD stage III, fibromyalgia, seizure disorder, and obesity s/p gastric bypass in 1992 who is being seen today for the evaluation of worsening shortness of breath at the request of Dr. Lupita Eaton.  History of Present Illness:   Jillian Eaton is a 70 year old female with the above history who if followed by Dr. Debara Eaton. Patient has long history of dyspnea that has worsened lately. Last PFTs in 02/2019 showed FVC 1.73 (61% predicted), postbronchodilator ratio 83, postbronchodilator FEV1 1.44 (65% predicted), positive bronchodilator response in FEV1, reversibility with ratio as well as mid flow reversibility, DLCO 15.87 (72% predicted). She was seen by Pulmonology on 04/01/2020 and was not felt to have an acute pulmonary cause for her dyspnea. She was seen by Jillian Memos, NP, on 04/07/2020 and was felt to be volume overloaded. She was given a trail of increased diuretics. Chest CTA on 08/08/2020 was negative for PE but did show bilateral pleural effusions and pulmonary hypertension. She was seen for follow-up by Dr. Debara Eaton on 04/19/2020 at which time she did feel like the increase in diuretic helped her breathing some. However, she continued  to note unexpected weight loss with significant anorexia, nausea, and vomiting as well as food getting stuck. Weight was down 20 lbs since August 2021. She also complained of some discomfort around her pacemaker site which was felt to be due to significant weight loss. She was not felt to be volume overloaded. Main issue was felt to be significant weight loss. She was scheduled to undergo endoscopy on 04/23/2020 with Jillian Eaton.  Patient presented to the ED yesterday via EMS for further evaluation of shortness of breath and syncope.  Patient reports worsening dyspnea since the end of January/early February.  She states this is mostly with exertion.  Dyspnea has significantly progressed over that time and now she has shortness of breath with minimal activity such as cooking.  No significant shortness of breath at rest.  No orthopnea, PND, or significant edema. She was recently started on home O2. This was delivered 2 days ago and she has been on 2-3L basically 24/7 since that time. Of note, she was diagnosed with COVID at the beginning of January but symptoms were mild and did not last long and then she returned to baseline.  He also reports significant fatigue and decreased exercise tolerance since the end of January but especially over the last 2 weeks.  She can no longer go shopping for groceries because she gets so fatigued.  She notes a very slight chest discomfort that she describes as a "fullness all the time."  No real pain.  He does note some occasional palpitations especially when she gets acutely short of breath.  She also notes some lightheadedness/dizziness.  Yesterday, she got acutely short of breath while  sitting down cooking and she had associated lightheadedness/dizziness and near syncope with this.  She states she felt very close to passing out but denies any overt syncope.  She called 911 because she felt so bad.  She also continues to have significant GI issues including anorexia, nausea/vomiting.  No abnormal bleeding in urine or stools.   When EMS arrive, O2 sats reportedly 72% on home O2. Upon arrival to the ED, patient hypotensive with BP of 84/20. O2 sats 100% on 3L via nasal cannula. EKG showed ventricular paced rhythm. High-sensitivity troponin negative. BNP 167. Chest x-ray showed no acute findings. WBC 4.7, Hgb 10.5, Plts 175. Na 142, K 4.2, Glucose 65, BUN 26, Cr 1.27, Calcium 6.9. COVID-19 testing negative. Patient was admitted under Internal Medicine service for further evaluation and Cardiology was consulted for assistance.   At the time of this evaluation, she is eating lunch. Breathing comfortably. She denies any history of tobacco abuse.   Of note, patient has had problems with hypoglycemia this admission.  Past Medical History:  Diagnosis Date  . Anemia    iron and pernicious  . Arthritis   . Asthma   . Cancer ([place])   . CHF (congestive heart failure) ([place]) 2018  . Complication of anesthesia    BP dropped   . DOE (dyspnea on exertion)    2D ECHO, 02/12/2012 - EF 60-65%, moderate concentric hypertrophy  . Dyspnea   . Dysrhythmia    a-fib  . Fibromyalgia    nerve pain"left side at waist level" "can't lay on that side without pain" , "HOB elevation helps"; pt. thinks this has resolved after 200lb weight loss9-17- since i lost the wieght , i dont  think i have this anymore   . Headache 04/2019  . Heart murmur   . Hematuria - cause not known    resolved   . History of COVID-19   . History of kidney stones    x 2 '13, '14 surgery to remove  . Hypertension   . Pneumonia    aspiration  04/2016  . Presence of permanent cardiac pacemaker   . SBO (small bowel obstruction) ([place])    rqueired admission 2020  . Thyroid disease    "goiter"  . Transfusion history    10 yrs+    Past Surgical History:  Procedure Laterality Date  . ABDOMINAL ADHESION SURGERY  2004   open LOA - in [place]  . AV NODE ABLATION N/A 06/20/2019   Procedure: AV NODE ABLATION;  Surgeon: Evans Lance, MD;  Location: [place] [facility];  Service: Cardiovascular;  Laterality: N/A;  . BALLOON DILATION N/A 07/25/2019   Procedure: BALLOON DILATION;  Surgeon: Carol Ada, MD;  Location: [facility];  Service: Endoscopy;  Laterality: N/A;  . CARDIAC CATHETERIZATION  04/04/2010   No significant obstructive coronary artery disease  . CARDIOVERSION N/A 08/08/2017   Procedure: CARDIOVERSION;  Surgeon: Jillian Casino, MD;  Location: [facility] ENDOSCOPY;  Service: Cardiovascular;  Laterality: N/A;  . CARDIOVERSION N/A 06/18/2019   Procedure: CARDIOVERSION;  Surgeon: Jillian Casino, MD;  Location: [place];  Service: Cardiovascular;  Laterality: N/A;  . CHOLECYSTECTOMY  1990  . COLONOSCOPY W/ POLYPECTOMY    . COLONOSCOPY WITH PROPOFOL N/A 04/10/2014   Procedure: COLONOSCOPY WITH PROPOFOL;  Surgeon: Beryle Beams, MD;  Location: [facility];  Service: Endoscopy;  Laterality: N/A;  . CYSTOSCOPY/URETEROSCOPY/HOLMIUM LASER/STENT PLACEMENT Right 08/13/2019   Procedure: CYSTOSCOPY/RETROGRADE/URETEROSCOPY/HOLMIUM LASER/STENT PLACEMENT;  Surgeon: Ceasar Mons, MD;  Location:  [facility];  Service: Urology;  Laterality: Right;  ONLY NEEDS 45 MIN  . ESOPHAGOGASTRODUODENOSCOPY (EGD) WITH PROPOFOL N/A 07/25/2019   Procedure: ESOPHAGOGASTRODUODENOSCOPY (EGD) WITH PROPOFOL;  Surgeon: Carol Ada, MD;  Location: [facility];  Service: Endoscopy;  Laterality: N/A;  . EYE SURGERY     lasik 20-25 yrs ago  . GASTROPLASTY VERTICAL BANDED  1985  . [place]   In [place] for SBO  . MALONEY DILATION  07/25/2019   Procedure: Venia Minks DILATION;  Surgeon: Carol Ada, MD;  Location: [facility];  Service: Endoscopy;;  . PACEMAKER IMPLANT N/A 06/20/2019   Procedure: PACEMAKER IMPLANT;  Surgeon: Evans Lance, MD;  Location: [place] [facility];  Service: Cardiovascular;  Laterality: N/A;  . REVERSE SHOULDER ARTHROPLASTY Right 03/29/2018   Procedure: REVERSE SHOULDER  ARTHROPLASTY;  Surgeon: Netta Cedars, MD;  Location: [facility];  Service: Orthopedics;  Laterality: Right;  . ROUX-EN-Y GASTRIC BYPASS  1992   Conversion VBG to RnYGB in [place], [place]  . SHOULDER ARTHROSCOPY WITH SUBACROMIAL DECOMPRESSION  2016   Dr Berenice Primas, [place], [place]  . SMALL INTESTINE SURGERY  2000   SBO - LOA w SB resection  . TOTAL KNEE ARTHROPLASTY Left 11/14/2018   Procedure: TOTAL KNEE ARTHROPLASTY;  Surgeon: Paralee Cancel, MD;  Location: [facility];  Service: Orthopedics;  Laterality: Left;  70 mins  . TOTAL KNEE ARTHROPLASTY Right 04/29/2019   Procedure: TOTAL KNEE ARTHROPLASTY;  Surgeon: Paralee Cancel, MD;  Location: [facility];  Service: Orthopedics;  Laterality: Right;  70 mins  . TOTAL THYROIDECTOMY  06/26/2012   Dr Celine Ahr, [place] [facility]  . TUBAL LIGATION  1986  . URETHRAL DILATION  2012   w cystoscopy.  Dr Gaynelle Arabian  . UTERINE FIBROID EMBOLIZATION       Home Medications:  Prior to Admission medications   Medication Sig Start Date End Date Taking? Authorizing Provider  acetaminophen (TYLENOL) 650 MG CR tablet Take 1,300 mg by mouth every 8 (eight) hours as needed for pain.   Yes [provider]  albuterol (VENTOLIN HFA) 108 (90 Base) MCG/ACT inhaler Inhale 2 puffs into the lungs every 6 (six) hours as needed for wheezing or shortness of breath.   Yes [provider]  calcitRIOL (ROCALTROL) 0.25 MCG capsule Take 1 capsule (0.25 mcg total) by mouth daily. Patient taking differently: Take 0.5 mcg by mouth daily. 07/09/19  Yes Philemon Kingdom, MD  cyanocobalamin (,VITAMIN B-12,) 1000 MCG/ML injection Inject 1 mL (1,000 mcg total) into the muscle every 30 (thirty) days. 04/05/18  Yes Hennie Duos, MD  cyclobenzaprine (FLEXERIL) 10 MG tablet Take 20 mg by mouth at bedtime.    Yes [provider]  Docusate Sodium (DSS) 100 MG CAPS Take 100 mg by mouth daily.   Yes [provider]  DULoxetine (CYMBALTA) 60 MG capsule Take 1 capsule (60 mg total)  by mouth daily. 04/05/18  Yes Hennie Duos, MD  ELIQUIS 5 MG TABS tablet TAKE 1 TABLET TWICE A DAY Patient taking differently: Take 5 mg by mouth 2 (two) times daily. 04/12/20  Yes Isaiah Serge, NP  eszopiclone (LUNESTA) 2 MG TABS tablet Take 1 tablet (2 mg total) by mouth at bedtime. 04/05/18  Yes Hennie Duos, MD  famotidine (PEPCID) 20 MG tablet Take 20 mg by mouth daily as needed for heartburn or indigestion.   Yes [provider]  FOLIC ACID PO Take 1 tablet by mouth daily.   Yes [provider]  furosemide (LASIX) 20 MG tablet Take 20 mg by mouth daily as needed for edema.   Yes [provider]  levothyroxine (SYNTHROID) 137 MCG tablet TAKE 1 TABLET (137 MCG TOTAL) BY MOUTH DAILY BEFORE BREAKFAST. 11/17/19  Yes Philemon Kingdom, MD  magnesium oxide (MAG-OX) 400 (241.3 Mg) MG tablet Take 400 mg by mouth daily.  04/29/19  Yes [provider]  mirtazapine (REMERON) 7.5 MG tablet Take 7.5 mg by mouth at bedtime.   Yes [provider]  montelukast (SINGULAIR) 10 MG tablet TAKE 1 TABLET BY MOUTH EVERYDAY AT BEDTIME Patient taking differently: Take 10 mg by mouth at bedtime. 03/08/20  Yes Chesley Mires, MD  potassium chloride (KLOR-CON) 10 MEQ tablet Take 10 mEq by mouth daily as needed (low potassium). 04/07/20  Yes [provider]  traMADol (ULTRAM) 50 MG tablet Take 50 mg by mouth 3 (three) times daily as needed for moderate pain.   Yes [provider]  Vitamin D, Ergocalciferol, (DRISDOL) 1.25 MG (50000 UT) CAPS capsule Take 1 capsule (50,000 Units total) by mouth every Wednesday. 04/10/18  Yes Hennie Duos, MD    Inpatient Medications: Scheduled Meds: . apixaban  5 mg Oral BID  . calcitRIOL  0.5 mcg Oral Daily  . [START ON 04/23/2020] cosyntropin  0.25 mg Intravenous Once  . DULoxetine  60 mg Oral Daily  . folic acid  1 mg Oral Daily  . levothyroxine  137 mcg Oral Q0600  . magnesium oxide  400 mg Oral Daily  .  methylPREDNISolone (SOLU-MEDROL) injection  125 mg Intravenous Once  . mirtazapine  7.5 mg Oral QHS  . montelukast  10 mg Oral QHS   Continuous Infusions: . dextrose     PRN Meds: acetaminophen, albuterol, famotidine, furosemide, ondansetron **OR** ondansetron (ZOFRAN) IV, polyethylene glycol, traMADol, zolpidem  Allergies:    Allergies  Allergen Reactions  . Oxycodone-Acetaminophen Itching, Nausea And Vomiting, Rash and Other (See Comments)    Other reaction(s): Unknown  . Hydrocodone Itching         Social History:   Social History   Socioeconomic History  . Marital status: Married    Spouse name: Not on file  . Number of children: 3  . Years of education: Not on file  . Highest education level: Master's degree (e.g., MA, MS, MEng, MEd, MSW, MBA)  Occupational History  . Occupation: retired  Tobacco Use  . Smoking status: Never Smoker  . Smokeless tobacco: Never Used  Vaping Use  . Vaping Use: Never used  Substance and Sexual Activity  . Alcohol use: No    Comment: wine occ  . Drug use: No    Types: Oxycodone    Comment: perscribed  . Sexual activity: Not Currently    Birth control/protection: None  Other Topics Concern  . Not on file  Social History Narrative   Right handed   1 cup of caffeine per day        Social Determinants of Health   Financial Resource Strain: Not on file  Food Insecurity: Not on file  Transportation Needs: Not on file  Physical Activity: Not on file  Stress: Not on file  Social Connections: Not on file  Intimate Partner Violence: Not on file    Family History:    Family History  Problem Relation Age of Onset  . Diabetes Mother   . Epilepsy Mother   . Cancer Mother        Breast  . Hypertension Mother   .  Breast cancer Mother   . Seizures Mother   . Kidney disease Father   . Diabetes Father   . Hypertension Father   . Asthma Father   . Heart disease Father   . Epilepsy Sister   . Cancer Maternal Grandmother   .  Breast cancer Maternal Grandmother   . Cancer Paternal Grandmother   . Breast cancer Paternal Grandmother      ROS:  Please see the history of present illness.  All other ROS reviewed and negative.     Physical Exam/Data:   Vitals:   04/22/20 1145 04/22/20 1200 04/22/20 1215 04/22/20 1304  BP: 119/87 134/86 129/84 108/76  Pulse: (!) 59 (!) 59 (!) 59 61  Resp: 18 20 (!) 21 16  Temp:    (!) 97.3 F (36.3 C)  TempSrc:      SpO2: 100% 100% 100% 100%    Intake/Output Summary (Last 24 hours) at 04/22/2020 1532 Last data filed at 04/21/2020 2041 Gross per 24 hour  Intake 1000 ml  Output --  Net 1000 ml   Last 3 Weights 04/19/2020 04/16/2020 04/07/2020  Weight (lbs) 171 lb 12.8 oz 174 lb 185 lb 12.8 oz  Weight (kg) 77.928 kg 78.926 kg 84.278 kg     There is no height or weight on file to calculate BMI.  General: 70 y.o. female resting comfortably in no acute distress. HEENT: Normocephalic and atraumatic. Sclera clear.  Neck: Supple. No carotid bruits. No JVD. Heart: RRR. Distinct S1 and S2. No murmurs, gallops, or rubs. Radial pulses 2+ and equal bilaterally. Lungs: No increased work of breathing. Clear to ausculation bilaterally. No wheezes, rhonchi, or rales.  Abdomen: Soft, non-distended, and non-tender to palpation. Bowel sounds present. Extremities: No significant lower extremity edema.    Skin: Warm and dry. Neuro: Alert and oriented x3. No focal deficits. Psych: Normal affect. Responds appropriately.  EKG:  The EKG was personally reviewed and demonstrates: Ventricular paced rhythm, rate 60 bpm, with no acute ischemic changes.  Telemetry:  Telemetry was personally reviewed and demonstrates:  Ventricular paced with rates in the 60's.  Relevant CV Studies:  Echocardiogram 06/14/2019: Impressions: 1. Left ventricular ejection fraction, by estimation, is 60 to 65%. The  left ventricle has normal function. The left ventricle has no regional  wall motion abnormalities. Left  ventricular diastolic parameters are  indeterminate.  2. Right ventricular systolic function is normal. The right ventricular  size is mildly enlarged. There is moderately elevated pulmonary artery  systolic pressure. The estimated right ventricular systolic pressure is  52.7 mmHg.  3. Left atrial size was moderately dilated.  4. Right atrial size was moderately dilated.  5. The mitral valve is normal in structure. Mild mitral valve  regurgitation. No evidence of mitral stenosis.  6. Tricuspid valve regurgitation is moderate.  7. The aortic valve is tricuspid. Aortic valve regurgitation is trivial.  Mild aortic valve sclerosis is present, with no evidence of aortic valve  stenosis.  8. The inferior vena cava is normal in size with greater than 50%  respiratory variability, suggesting right atrial pressure of 3 mmHg.   Comparison(s): No significant change from prior study. Prior images  reviewed side by side.  Laboratory Data:  High Sensitivity Troponin:   Recent Labs  Lab 04/21/20 1556 04/22/20 0500  TROPONINIHS 4 5     Chemistry Recent Labs  Lab 04/21/20 1556 04/22/20 0306 04/22/20 0402  NA 142 141 142  K 4.2 3.9 3.9  CL 110 112*  --  CO2 23 22  --   GLUCOSE 65* 67*  --   BUN 26* 26*  --   CREATININE 1.27* 1.20*  --   CALCIUM 6.9* 6.3*  --   GFRNONAA 46* 50*  --   ANIONGAP 9 7  --     Recent Labs  Lab 04/22/20 0306  PROT 4.8*  ALBUMIN 2.0*  AST 21  ALT 24  ALKPHOS 56  BILITOT 0.8   Hematology Recent Labs  Lab 04/21/20 1556 04/22/20 0306 04/22/20 0402  WBC 4.7 3.8*  --   RBC 3.39* 2.81*  --   HGB 10.5* 9.0* 8.8*  HCT 33.8* 27.8* 26.0*  MCV 99.7 98.9  --   MCH 31.0 32.0  --   MCHC 31.1 32.4  --   RDW 16.6* 16.6*  --   PLT 175 149*  --    BNP Recent Labs  Lab 04/21/20 1556  BNP 167.9*    DDimer No results for input(s): DDIMER in the last 168 hours.   Radiology/Studies:  DG Chest 2 View  Result Date: 04/21/2020 CLINICAL DATA:   Shortness of breath for 1 day EXAM: CHEST - 2 VIEW COMPARISON:  04/01/2020 FINDINGS: Pacing device is again noted. Cardiac enlargement is again seen and stable. The lungs are clear bilaterally. No acute bony abnormality is seen. Right shoulder replacement is noted. IMPRESSION: No acute abnormality noted.  Stable cardiomegaly is seen. Electronically Signed   By: Inez Catalina M.D.   On: 04/21/2020 16:54     Assessment and Plan:   Dyspnea  Chronic Combined CHF Pulmonary Hypertension Asthma - Patient has a long history of dyspnea which has worsened lately. Recently seen by both Pulmonology and Cardiology with unremarkable work-up. Chest CTA was negative for PE but did show small bilateral pleural effusion and evidence of pulmonary hypertension. She does have a history of chronic combined CHF but was felt to be euvolemic at visit with Cardiology on 04/19/2020. She ultimately presented to the ED due to worsening shortness of breath with O2 sats in the 70's. - BNP was mildly elevated at 167.  - Chest x-ray showed no acute findings.  - Last Echo from 05/2019 showed LVEF of 60-65% with normal wall motion, biatrial enlargement, mild MR, moderate TR. RV mildly enlarged with normal systolic function and moderately elevated PASP of 50.7 mmHg.  - Will update Echo.  - Appears euvolemic on exam.  - Will hold off on diuresis for now.  - Reviewed with Dr. Audie Box. Dyspnea felt to likely be due to pulmonary hypertension secondary to lung disease. Will plan for right/left cardiac catheterization tomorrow. The patient understands that risks include but are not limited to stroke (1 in 1000), death (1 in 36), kidney failure [usually temporary] (1 in 500), bleeding (1 in 200), allergic reaction [possibly serious] (1 in 200), and agrees to proceed.   Chronic Atrial Fibrillation Complete Heart Block S/p AV Nodal Ablation and [employer] PPM Placement in 05/2019 - Not on any AV nodal agents. - Continue chronic  anticoagulation with Eliquis 5mg  twice daily. - Followed by Dr. Lovena Le.  Hypotension - History of hypertension but patient was hypotensive on presentation. Systolic BP as low as 47/82. Received IV fluids bolus and now BP much improved. Systolic BP ranging from the low 100's to 130's.  - Not on any antihypertensives. - Continue to monitor closely.  Unintentional Weight Loss Nausea/Vomiting - Patient has had significant weight loss as well as nausea/vomting over the last several months. Possibly secondary to chronic  malnutrition due to prior gastric bypass surgery. - Nutrition consult already placed. - Patient was scheduled for upper and lower endoscopy tomorrow to rule out GI malignancy but looks like this has now been postponed due to acute issues. - Management per primary team.  Otherwise, per primary team: - Post surgical hypothyroidism - Iatrogenic hypocalcemia - Mild intermittent asthma - Hypoglycemia    Risk Assessment/Risk Scores:   New York Heart Association (NYHA) Functional Class NYHA Class III. Suspect secondary to pulmonary issues rather than CHF.   For questions or updates, please contact [place] Please consult [email or web address] for contact info under    Signed, Darreld Mclean, PA-C  04/22/2020 3:32 PM

## 2020-04-23 ENCOUNTER — Encounter (HOSPITAL_COMMUNITY)
Admission: EM | Disposition: A | Discharge status: Home / Self Care | Payer: Self-pay | Source: Home / Self Care | Attending: Internal Medicine

## 2020-04-23 ENCOUNTER — Telehealth: Payer: Self-pay | Admitting: Pulmonary Disease

## 2020-04-23 ENCOUNTER — Ambulatory Visit (HOSPITAL_COMMUNITY): Admission: RE | Admit: 2020-04-23 | Payer: Medicare Other | Source: Home / Self Care | Admitting: Gastroenterology

## 2020-04-23 DIAGNOSIS — J4520 Mild intermittent asthma, uncomplicated: Secondary | ICD-10-CM

## 2020-04-23 DIAGNOSIS — R0602 Shortness of breath: Secondary | ICD-10-CM

## 2020-04-23 DIAGNOSIS — R0902 Hypoxemia: Secondary | ICD-10-CM | POA: Diagnosis not present

## 2020-04-23 DIAGNOSIS — I272 Pulmonary hypertension, unspecified: Secondary | ICD-10-CM | POA: Diagnosis not present

## 2020-04-23 DIAGNOSIS — J9691 Respiratory failure, unspecified with hypoxia: Secondary | ICD-10-CM | POA: Diagnosis not present

## 2020-04-23 HISTORY — PX: RIGHT/LEFT HEART CATH AND CORONARY ANGIOGRAPHY: CATH118266

## 2020-04-23 HISTORY — DX: Personal history of COVID-19: Z86.16

## 2020-04-23 LAB — CBC
HCT: 28.5 % — ABNORMAL LOW (ref 36.0–46.0)
Hemoglobin: 9.5 g/dL — ABNORMAL LOW (ref 12.0–15.0)
MCH: 32.1 pg (ref 26.0–34.0)
MCHC: 33.3 g/dL (ref 30.0–36.0)
MCV: 96.3 fL (ref 80.0–100.0)
Platelets: 152 10*3/uL (ref 150–400)
RBC: 2.96 MIL/uL — ABNORMAL LOW (ref 3.87–5.11)
RDW: 16.5 % — ABNORMAL HIGH (ref 11.5–15.5)
WBC: 3.8 10*3/uL — ABNORMAL LOW (ref 4.0–10.5)
nRBC: 0 % (ref 0.0–0.2)

## 2020-04-23 LAB — ACTH STIMULATION, 3 TIME POINTS
Cortisol, 30 Min: 16.7 ug/dL
Cortisol, 60 Min: 20 ug/dL
Cortisol, Base: 6.5 ug/dL

## 2020-04-23 LAB — POCT I-STAT 7, (LYTES, BLD GAS, ICA,H+H)
Acid-base deficit: 3 mmol/L — ABNORMAL HIGH (ref 0.0–2.0)
Bicarbonate: 22.4 mmol/L (ref 20.0–28.0)
Calcium, Ion: 0.94 mmol/L — ABNORMAL LOW (ref 1.15–1.40)
HCT: 27 % — ABNORMAL LOW (ref 36.0–46.0)
Hemoglobin: 9.2 g/dL — ABNORMAL LOW (ref 12.0–15.0)
O2 Saturation: 96 %
Potassium: 3.9 mmol/L (ref 3.5–5.1)
Sodium: 141 mmol/L (ref 135–145)
TCO2: 24 mmol/L (ref 22–32)
pCO2 arterial: 39.8 mmHg (ref 32.0–48.0)
pH, Arterial: 7.358 (ref 7.350–7.450)
pO2, Arterial: 85 mmHg (ref 83.0–108.0)

## 2020-04-23 LAB — COMPREHENSIVE METABOLIC PANEL
ALT: 22 U/L (ref 0–44)
AST: 21 U/L (ref 15–41)
Albumin: 1.9 g/dL — ABNORMAL LOW (ref 3.5–5.0)
Alkaline Phosphatase: 51 U/L (ref 38–126)
Anion gap: 8 (ref 5–15)
BUN: 25 mg/dL — ABNORMAL HIGH (ref 8–23)
CO2: 22 mmol/L (ref 22–32)
Calcium: 6.7 mg/dL — ABNORMAL LOW (ref 8.9–10.3)
Chloride: 109 mmol/L (ref 98–111)
Creatinine, Ser: 1.16 mg/dL — ABNORMAL HIGH (ref 0.44–1.00)
GFR, Estimated: 52 mL/min — ABNORMAL LOW (ref >60)
Glucose, Bld: 86 mg/dL (ref 70–99)
Potassium: 3.8 mmol/L (ref 3.5–5.1)
Sodium: 139 mmol/L (ref 135–145)
Total Bilirubin: 0.7 mg/dL (ref 0.3–1.2)
Total Protein: 4.4 g/dL — ABNORMAL LOW (ref 6.5–8.1)

## 2020-04-23 LAB — GLUCOSE, CAPILLARY
Glucose-Capillary: 65 mg/dL — ABNORMAL LOW (ref 70–99)
Glucose-Capillary: 112 mg/dL — ABNORMAL HIGH (ref 70–99)
Glucose-Capillary: 68 mg/dL — ABNORMAL LOW (ref 70–99)
Glucose-Capillary: 73 mg/dL (ref 70–99)
Glucose-Capillary: 120 mg/dL — ABNORMAL HIGH (ref 70–99)
Glucose-Capillary: 59 mg/dL — ABNORMAL LOW (ref 70–99)
Glucose-Capillary: 80 mg/dL (ref 70–99)
Glucose-Capillary: 131 mg/dL — ABNORMAL HIGH (ref 70–99)
Glucose-Capillary: 96 mg/dL (ref 70–99)
Glucose-Capillary: 121 mg/dL — ABNORMAL HIGH (ref 70–99)

## 2020-04-23 LAB — POCT I-STAT EG7
Acid-base deficit: 2 mmol/L (ref 0.0–2.0)
Acid-base deficit: 2 mmol/L (ref 0.0–2.0)
Bicarbonate: 23.7 mmol/L (ref 20.0–28.0)
Bicarbonate: 23.9 mmol/L (ref 20.0–28.0)
Calcium, Ion: 1.01 mmol/L — ABNORMAL LOW (ref 1.15–1.40)
Calcium, Ion: 1.02 mmol/L — ABNORMAL LOW (ref 1.15–1.40)
HCT: 27 % — ABNORMAL LOW (ref 36.0–46.0)
HCT: 28 % — ABNORMAL LOW (ref 36.0–46.0)
Hemoglobin: 9.2 g/dL — ABNORMAL LOW (ref 12.0–15.0)
Hemoglobin: 9.5 g/dL — ABNORMAL LOW (ref 12.0–15.0)
O2 Saturation: 62 %
O2 Saturation: 62 %
Potassium: 4.1 mmol/L (ref 3.5–5.1)
Potassium: 4.1 mmol/L (ref 3.5–5.1)
Sodium: 140 mmol/L (ref 135–145)
Sodium: 140 mmol/L (ref 135–145)
TCO2: 25 mmol/L (ref 22–32)
TCO2: 25 mmol/L (ref 22–32)
pCO2, Ven: 45.6 mmHg (ref 44.0–60.0)
pCO2, Ven: 46.1 mmHg (ref 44.0–60.0)
pH, Ven: 7.322 (ref 7.250–7.430)
pH, Ven: 7.324 (ref 7.250–7.430)
pO2, Ven: 35 mmHg (ref 32.0–45.0)
pO2, Ven: 35 mmHg (ref 32.0–45.0)

## 2020-04-23 LAB — BETA-HYDROXYBUTYRIC ACID: Beta-Hydroxybutyric Acid: 0.05 mmol/L (ref 0.05–0.27)

## 2020-04-23 SURGERY — COLONOSCOPY WITH PROPOFOL
Anesthesia: Monitor Anesthesia Care

## 2020-04-23 SURGERY — RIGHT/LEFT HEART CATH AND CORONARY ANGIOGRAPHY
Anesthesia: LOCAL

## 2020-04-23 MED ORDER — HEPARIN (PORCINE) IN NACL 1000-0.9 UT/500ML-% IV SOLN
INTRAVENOUS | Status: DC | PRN
  Administered 2020-04-23 (×2): 500 mL

## 2020-04-23 MED ORDER — DEXTROSE 50 % IV SOLN
12.5000 g | INTRAVENOUS | Status: AC
  Administered 2020-04-23: 12.5 g via INTRAVENOUS

## 2020-04-23 MED ORDER — ADULT MULTIVITAMIN W/MINERALS CH
1.0000 | ORAL_TABLET | Freq: Two times a day (BID) | ORAL | Status: DC
  Administered 2020-04-23 – 2020-04-27 (×7): 1 via ORAL
  Filled 2020-04-23 (×8): qty 1

## 2020-04-23 MED ORDER — IOHEXOL 350 MG/ML SOLN
INTRAVENOUS | Status: DC | PRN
  Administered 2020-04-23: 40 mL

## 2020-04-23 MED ORDER — HEPARIN (PORCINE) IN NACL 1000-0.9 UT/500ML-% IV SOLN
INTRAVENOUS | Status: AC
  Filled 2020-04-23: qty 1000

## 2020-04-23 MED ORDER — MIDAZOLAM HCL 2 MG/2ML IJ SOLN
INTRAMUSCULAR | Status: AC
  Filled 2020-04-23: qty 2

## 2020-04-23 MED ORDER — CALCIUM CARBONATE 1250 (500 CA) MG PO TABS
1.0000 | ORAL_TABLET | Freq: Two times a day (BID) | ORAL | Status: DC
  Administered 2020-04-23 – 2020-04-26 (×4): 500 mg via ORAL
  Filled 2020-04-23 (×4): qty 1

## 2020-04-23 MED ORDER — DEXTROSE 50 % IV SOLN
12.5000 g | INTRAVENOUS | Status: AC
  Administered 2020-04-23: 12.5 g via INTRAVENOUS
  Filled 2020-04-23: qty 50

## 2020-04-23 MED ORDER — LIDOCAINE HCL (PF) 1 % IJ SOLN
INTRAMUSCULAR | Status: DC | PRN
  Administered 2020-04-23: 2 mL
  Administered 2020-04-23: 4 mL

## 2020-04-23 MED ORDER — VERAPAMIL HCL 2.5 MG/ML IV SOLN
INTRAVENOUS | Status: DC | PRN
  Administered 2020-04-23: 10 mL via INTRA_ARTERIAL

## 2020-04-23 MED ORDER — FENTANYL CITRATE (PF) 100 MCG/2ML IJ SOLN
INTRAMUSCULAR | Status: DC | PRN
  Administered 2020-04-23 (×3): 25 ug via INTRAVENOUS

## 2020-04-23 MED ORDER — LIDOCAINE HCL (PF) 1 % IJ SOLN
INTRAMUSCULAR | Status: AC
  Filled 2020-04-23: qty 30

## 2020-04-23 MED ORDER — DEXTROSE 50 % IV SOLN
INTRAVENOUS | Status: AC
  Filled 2020-04-23: qty 50

## 2020-04-23 MED ORDER — CALCITRIOL 0.25 MCG PO CAPS
0.5000 ug | ORAL_CAPSULE | Freq: Every day | ORAL | Status: DC
  Administered 2020-04-23: 0.25 ug via ORAL
  Filled 2020-04-23: qty 2

## 2020-04-23 MED ORDER — HEPARIN SODIUM (PORCINE) 1000 UNIT/ML IJ SOLN
INTRAMUSCULAR | Status: AC
  Filled 2020-04-23: qty 1

## 2020-04-23 MED ORDER — ENSURE ENLIVE PO LIQD
237.0000 mL | Freq: Three times a day (TID) | ORAL | Status: DC
  Administered 2020-04-26 – 2020-05-04 (×11): 237 mL via ORAL
  Filled 2020-04-23: qty 237

## 2020-04-23 MED ORDER — MIDAZOLAM HCL 2 MG/2ML IJ SOLN
INTRAMUSCULAR | Status: DC | PRN
  Administered 2020-04-23 (×2): 1 mg via INTRAVENOUS

## 2020-04-23 MED ORDER — HEPARIN SODIUM (PORCINE) 1000 UNIT/ML IJ SOLN
INTRAMUSCULAR | Status: DC | PRN
  Administered 2020-04-23: 4000 [IU] via INTRAVENOUS

## 2020-04-23 MED ORDER — VERAPAMIL HCL 2.5 MG/ML IV SOLN
INTRAVENOUS | Status: AC
  Filled 2020-04-23: qty 2

## 2020-04-23 MED ORDER — APIXABAN 5 MG PO TABS
5.0000 mg | ORAL_TABLET | Freq: Two times a day (BID) | ORAL | Status: DC
  Administered 2020-04-23 – 2020-05-04 (×22): 5 mg via ORAL
  Filled 2020-04-23 (×22): qty 1

## 2020-04-23 MED ORDER — FENTANYL CITRATE (PF) 100 MCG/2ML IJ SOLN
INTRAMUSCULAR | Status: AC
  Filled 2020-04-23: qty 2

## 2020-04-23 SURGICAL SUPPLY — 14 items
CATH 5FR JL3.5 JR4 ANG PIG MP (CATHETERS) ×2 IMPLANT
CATH BALLN WEDGE 5F 110CM (CATHETERS) ×2 IMPLANT
DEVICE RAD COMP TR BAND LRG (VASCULAR PRODUCTS) ×2 IMPLANT
GLIDESHEATH SLEND SS 6F .021 (SHEATH) ×2 IMPLANT
GUIDEWIRE .025 260CM (WIRE) ×2 IMPLANT
GUIDEWIRE INQWIRE 1.5J.035X260 (WIRE) ×1 IMPLANT
INQWIRE 1.5J .035X260CM (WIRE) ×2
KIT HEART LEFT (KITS) ×2 IMPLANT
KIT MICROPUNCTURE NIT STIFF (SHEATH) ×4 IMPLANT
PACK CARDIAC CATHETERIZATION (CUSTOM PROCEDURE TRAY) ×2 IMPLANT
SHEATH GLIDE SLENDER 4/5FR (SHEATH) ×2 IMPLANT
TRANSDUCER W/STOPCOCK (MISCELLANEOUS) ×2 IMPLANT
WIRE HI TORQ VERSACORE-J 145CM (WIRE) ×2 IMPLANT
WIRE MICROINTRODUCER 60CM (WIRE) ×4 IMPLANT

## 2020-04-23 NOTE — Consult Note (Signed)
NAME:  Jillian Eaton, MRN:  [id number], DOB:  1950/05/31, LOS: 1 ADMISSION DATE:  04/21/2020, CONSULTATION DATE:  04/23/20 REFERRING MD:  Lupita Leash - TRH, CHIEF COMPLAINT:  Hypoxia and Dyspnea   Brief History:  70 yo F 3-4 wk hx of SOB. Reportedly hypoxic though appears to be 98-100% on RA this admission. No explanation for SOB on R/LHC.  PCCM consult for SOB.   History of Present Illness:  70 yo F PMH gastric bypass, mild asthma, hypothyroidism, systolic and diastolic CHF, Afib on eliquis, s/p pacemaker, seizure disorder, CKD IIIa, HTN who was admitted to hospitalist service at [facility] 3/3 for SOB and reported hypoxia SpO2 in the 70s, though looks like this has grossly improved. SOB hs been present x 3-4 weeks. Went for [facility] 3/4 and there was no cardiac etiology to explain SOB and there is not evidence of pulmonary hypertension. Patient has also been hypoglycemic 3/4   In conversation with patient, states these "breathing spells" happen intermittently and last for 3-4 months. She describes them as choking, but then revises her description to say chest pain, revised to say too weak to breath. Problem is longstanding, I have reviewed [facility] office notes (follows with Tammy Parrett, Dr. Halford Chessman) as well as cardiology notes. Patient was recently prescribed home O2 (states from her PCP) but she denies needing it and thus does not use. Does not use pulm meds for these "breathing spells" as she states "it isn't a breathing problem." She states that when these spells happen, she cannot walk and feels completely debilitated.  PCCM consult 3/4 for evaluation of hypoxia and SOB, as well as hypoglycemia.   Past Medical History:  Fibromyalgia HTN Hypothyroidism Gastric bypass Systolic and diastolic HF (with recovered LVEF) Afib OSA  CKD IIIa Mild asthma   Significant Hospital Events:  3/3 admitted to [facility] for SOB, hypoxia, though noted in [facility] admit note pt not hypoxic at rest. 3/4 R/LHC without cardiac  explanation for SOB. No evidence of pulm HTN. PCCM consult.   Consults:  Cards PCCM  Procedures:  3/4 R/LHC >>>  Significant Diagnostic Tests:  3/2 CXR> clear lungs 3/4 ECHO> LVEF 55-60%, LA severely dilated. RA severely dilated. Moderate tricuspid valve regurg.   Micro Data:    Antimicrobials:    Interim History / Subjective:  Went for [facility]   Is 100% on RA, she is speaking in complete sentences without difficulty.   Patient states she feels fine today, and feels like she could walk down the hall if she were permitted.   Objective   Blood pressure 118/73, pulse (!) 59, temperature 98 F (36.7 C), resp. rate 19, weight 78.6 kg, SpO2 100 %.       No intake or output data in the 24 hours ending 04/23/20 1423 Filed Weights   04/22/20 1922  Weight: 78.6 kg    Examination: General: Chronically ill appearing older adult.  HENT: NCAT pink mm. Trachea midline Lungs: CTA bilaterally. Symmetrical chest expansion, no accessory use on RA  Cardiovascular: Cap refill brisk. s1s2 Abdomen: Soft round ndnt  Extremities: BLE edema.  Neuro: AAOx3 following commands GU: defer  Resolved Hospital Problem list     Assessment & Plan:   Dyspnea, acutely improved  Hypoxia, acutely improved -I am not sure what is driving symptoms this admission. Patient says that in the past her pacemaker implant helped, lasix helped, and that this episode feels the same as those episodes but also that it feels different. She describes feeling as if  she is being choked but does not feel that her breathing feels restricted in any way and when further questioned on this does not describe any sensation of upper airway spasm, edema, etc. She states it feels like her chest but not her lungs and not her heart.  -CXR from admission is clear. SpO2 is 100% on RA.  -Cardiac workup this admission has not revealed cardiac etiology of SOB. Not c/w CHF exacerbation, and no findings of pulmonary hypertension.  -has hx  mild asthma but there are no features of asthma at present  -no cough, no URI features  -Possibly related to post-covid symptoms? Though this of course would not explain prior episodes -habitus related + deconditioning?  -Anxiety component to SOB? P - Pulm hygiene, PT -Goal SpO2 > 90%. O2 as needed  -will discuss with Pulm physician  -recommend OP follow up   Hypoglycemia, improved -Hx gastric bypass surgeries -was NPO 3/4 P -seems to have improved after resuming POs after heart cath  -Appreciate RDN recs   Best practice (evaluated daily)  Diet: per primary Pain/Anxiety/Delirium protocol (if indicated): -- VAP protocol (if indicated): -- DVT prophylaxis:  GI prophylaxis:  Glucose control:  Mobility: PT  Disposition:med tele  Goals of Care:  Follow up goals of care discussion due: per primary   Code Status: Full   Labs   CBC: Recent Labs  Lab 04/21/20 1556 04/22/20 0306 04/22/20 0402 04/23/20 0357  WBC 4.7 3.8*  --  3.8*  NEUTROABS 3.2 2.0  --   --   HGB 10.5* 9.0* 8.8* 9.5*  HCT 33.8* 27.8* 26.0* 28.5*  MCV 99.7 98.9  --  96.3  PLT 175 149*  --  242    Basic Metabolic Panel: Recent Labs  Lab 04/21/20 1556 04/22/20 0306 04/22/20 0402 04/23/20 0357  NA 142 141 142 139  K 4.2 3.9 3.9 3.8  CL 110 112*  --  109  CO2 23 22  --  22  GLUCOSE 65* 67*  --  86  BUN 26* 26*  --  25*  CREATININE 1.27* 1.20*  --  1.16*  CALCIUM 6.9* 6.3*  --  6.7*  MG  --  1.6*  --   --    GFR: Estimated Creatinine Clearance: 50.8 mL/min (A) (by C-G formula based on SCr of 1.16 mg/dL (H)). Recent Labs  Lab 04/21/20 1556 04/22/20 0306 04/23/20 0357  WBC 4.7 3.8* 3.8*    Liver Function Tests: Recent Labs  Lab 04/22/20 0306 04/23/20 0357  AST 21 21  ALT 24 22  ALKPHOS 56 51  BILITOT 0.8 0.7  PROT 4.8* 4.4*  ALBUMIN 2.0* 1.9*   No results for input(s): LIPASE, AMYLASE in the last 168 hours. No results for input(s): AMMONIA in the last 168 hours.  ABG     Component Value Date/Time   PHART 7.427 04/22/2020 0402   PCO2ART 34.6 04/22/2020 0402   PO2ART 121 (H) 04/22/2020 0402   HCO3 22.8 04/22/2020 0402   TCO2 24 04/22/2020 0402   ACIDBASEDEF 1.0 04/22/2020 0402   O2SAT 99.0 04/22/2020 0402     Coagulation Profile: No results for input(s): INR, PROTIME in the last 168 hours.  Cardiac Enzymes: No results for input(s): CKTOTAL, CKMB, CKMBINDEX, TROPONINI in the last 168 hours.  HbA1C: Hgb A1c MFr Bld  Date/Time Value Ref Range Status  06/16/2019 05:55 AM 5.0 4.8 - 5.6 % Final    Comment:    (NOTE) Pre diabetes:  5.7%-6.4% Diabetes:              >6.4% Glycemic control for   <7.0% adults with diabetes   05/27/2018 05:43 AM 4.6 (L) 4.8 - 5.6 % Final    Comment:    (NOTE) Pre diabetes:          5.7%-6.4% Diabetes:              >6.4% Glycemic control for   <7.0% adults with diabetes     CBG: Recent Labs  Lab 04/23/20 0545 04/23/20 0711 04/23/20 0948 04/23/20 1021 04/23/20 1242  GLUCAP 68* 73 59* 120* 80    Review of Systems:   As per HPI   Past Medical History:  She,  has a past medical history of Anemia, Arthritis, Asthma, Cancer ([place]), CHF (congestive heart failure) ([place]) (7106), Complication of anesthesia, DOE (dyspnea on exertion), Dyspnea, Dysrhythmia, Fibromyalgia, Headache (04/2019), Heart murmur, Hematuria - cause not known, History of COVID-19, History of kidney stones, Hypertension, Pneumonia, Presence of permanent cardiac pacemaker, SBO (small bowel obstruction) ([place]), Thyroid disease, and Transfusion history.   Surgical History:   Past Surgical History:  Procedure Laterality Date  . ABDOMINAL ADHESION SURGERY  2004   open LOA - in [place]  . AV NODE ABLATION N/A 06/20/2019   Procedure: AV NODE ABLATION;  Surgeon: Evans Lance, MD;  Location: [place] [facility];  Service: Cardiovascular;  Laterality: N/A;  . BALLOON DILATION N/A 07/25/2019   Procedure: BALLOON DILATION;  Surgeon: Carol Ada, MD;   Location: [facility];  Service: Endoscopy;  Laterality: N/A;  . CARDIAC CATHETERIZATION  04/04/2010   No significant obstructive coronary artery disease  . CARDIOVERSION N/A 08/08/2017   Procedure: CARDIOVERSION;  Surgeon: Pixie Casino, MD;  Location: [facility];  Service: Cardiovascular;  Laterality: N/A;  . CARDIOVERSION N/A 06/18/2019   Procedure: CARDIOVERSION;  Surgeon: Pixie Casino, MD;  Location: [place];  Service: Cardiovascular;  Laterality: N/A;  . CHOLECYSTECTOMY  1990  . COLONOSCOPY W/ POLYPECTOMY    . COLONOSCOPY WITH PROPOFOL N/A 04/10/2014   Procedure: COLONOSCOPY WITH PROPOFOL;  Surgeon: Beryle Beams, MD;  Location: [facility];  Service: Endoscopy;  Laterality: N/A;  . CYSTOSCOPY/URETEROSCOPY/HOLMIUM LASER/STENT PLACEMENT Right 08/13/2019   Procedure: CYSTOSCOPY/RETROGRADE/URETEROSCOPY/HOLMIUM LASER/STENT PLACEMENT;  Surgeon: Ceasar Mons, MD;  Location: [facility];  Service: Urology;  Laterality: Right;  ONLY NEEDS 45 MIN  . ESOPHAGOGASTRODUODENOSCOPY (EGD) WITH PROPOFOL N/A 07/25/2019   Procedure: ESOPHAGOGASTRODUODENOSCOPY (EGD) WITH PROPOFOL;  Surgeon: Carol Ada, MD;  Location: [facility];  Service: Endoscopy;  Laterality: N/A;  . EYE SURGERY     lasik 20-25 yrs ago  . GASTROPLASTY VERTICAL BANDED  1985  . [place]   In [place] for SBO  . MALONEY DILATION  07/25/2019   Procedure: Venia Minks DILATION;  Surgeon: Carol Ada, MD;  Location: [facility];  Service: Endoscopy;;  . PACEMAKER IMPLANT N/A 06/20/2019   Procedure: PACEMAKER IMPLANT;  Surgeon: Evans Lance, MD;  Location: [place] [facility];  Service: Cardiovascular;  Laterality: N/A;  . REVERSE SHOULDER ARTHROPLASTY Right 03/29/2018   Procedure: REVERSE SHOULDER ARTHROPLASTY;  Surgeon: Netta Cedars, MD;  Location: [place];  Service: Orthopedics;  Laterality: Right;  . ROUX-EN-Y GASTRIC BYPASS  1992   Conversion VBG to RnYGB in [place], [place]  .  SHOULDER ARTHROSCOPY WITH SUBACROMIAL DECOMPRESSION  2016   Dr Berenice Primas, [place], [place]  . SMALL INTESTINE SURGERY  2000   SBO - LOA w SB  resection  . TOTAL KNEE ARTHROPLASTY Left 11/14/2018   Procedure: TOTAL KNEE ARTHROPLASTY;  Surgeon: Paralee Cancel, MD;  Location: [facility];  Service: Orthopedics;  Laterality: Left;  70 mins  . TOTAL KNEE ARTHROPLASTY Right 04/29/2019   Procedure: TOTAL KNEE ARTHROPLASTY;  Surgeon: Paralee Cancel, MD;  Location: [facility];  Service: Orthopedics;  Laterality: Right;  70 mins  . TOTAL THYROIDECTOMY  06/26/2012   Dr Celine Ahr, [facility]  . TUBAL LIGATION  1986  . URETHRAL DILATION  2012   w cystoscopy.  Dr Gaynelle Arabian  . UTERINE FIBROID EMBOLIZATION       Social History:   reports that she has never smoked. She has never used smokeless tobacco. She reports that she does not drink alcohol and does not use drugs.   Family History:  Her family history includes Asthma in her father; Breast cancer in her maternal grandmother, mother, and paternal grandmother; Cancer in her maternal grandmother, mother, and paternal grandmother; Diabetes in her father and mother; Epilepsy in her mother and sister; Heart disease in her father; Hypertension in her father and mother; Kidney disease in her father; Seizures in her mother.   Allergies Allergies  Allergen Reactions  . Oxycodone-Acetaminophen Itching, Nausea And Vomiting, Rash and Other (See Comments)    Other reaction(s): Unknown  . Hydrocodone Itching          Home Medications  Prior to Admission medications   Medication Sig Start Date End Date Taking? Authorizing Provider  acetaminophen (TYLENOL) 650 MG CR tablet Take 1,300 mg by mouth every 8 (eight) hours as needed for pain.   Yes [provider]  albuterol (VENTOLIN HFA) 108 (90 Base) MCG/ACT inhaler Inhale 2 puffs into the lungs every 6 (six) hours as needed for wheezing or shortness of breath.   Yes [provider]  calcitRIOL (ROCALTROL)  0.25 MCG capsule Take 1 capsule (0.25 mcg total) by mouth daily. Patient taking differently: Take 0.5 mcg by mouth daily. 07/09/19  Yes Philemon Kingdom, MD  cyanocobalamin (,VITAMIN B-12,) 1000 MCG/ML injection Inject 1 mL (1,000 mcg total) into the muscle every 30 (thirty) days. 04/05/18  Yes Hennie Duos, MD  cyclobenzaprine (FLEXERIL) 10 MG tablet Take 20 mg by mouth at bedtime.    Yes [provider]  Docusate Sodium (DSS) 100 MG CAPS Take 100 mg by mouth daily.   Yes [provider]  DULoxetine (CYMBALTA) 60 MG capsule Take 1 capsule (60 mg total) by mouth daily. 04/05/18  Yes Hennie Duos, MD  ELIQUIS 5 MG TABS tablet TAKE 1 TABLET TWICE A DAY Patient taking differently: Take 5 mg by mouth 2 (two) times daily. 04/12/20  Yes Isaiah Serge, NP  eszopiclone (LUNESTA) 2 MG TABS tablet Take 1 tablet (2 mg total) by mouth at bedtime. 04/05/18  Yes Hennie Duos, MD  famotidine (PEPCID) 20 MG tablet Take 20 mg by mouth daily as needed for heartburn or indigestion.   Yes [provider]  FOLIC ACID PO Take 1 tablet by mouth daily.   Yes [provider]  furosemide (LASIX) 20 MG tablet Take 20 mg by mouth daily as needed for edema.   Yes [provider]  levothyroxine (SYNTHROID) 137 MCG tablet TAKE 1 TABLET (137 MCG TOTAL) BY MOUTH DAILY BEFORE BREAKFAST. 11/17/19  Yes Philemon Kingdom, MD  magnesium oxide (MAG-OX) 400 (241.3 Mg) MG tablet Take 400 mg by mouth daily.  04/29/19  Yes [provider]  mirtazapine (REMERON) 7.5 MG tablet  Take 7.5 mg by mouth at bedtime.   Yes [provider]  montelukast (SINGULAIR) 10 MG tablet TAKE 1 TABLET BY MOUTH EVERYDAY AT BEDTIME Patient taking differently: Take 10 mg by mouth at bedtime. 03/08/20  Yes Chesley Mires, MD  potassium chloride (KLOR-CON) 10 MEQ tablet Take 10 mEq by mouth daily as needed (low potassium). 04/07/20  Yes [provider]  traMADol (ULTRAM) 50 MG tablet Take  50 mg by mouth 3 (three) times daily as needed for moderate pain.   Yes [provider]  Vitamin D, Ergocalciferol, (DRISDOL) 1.25 MG (50000 UT) CAPS capsule Take 1 capsule (50,000 Units total) by mouth every Wednesday. 04/10/18  Yes Hennie Duos, MD     CCT: n/a   Eliseo Gum MSN, AGACNP-BC [place] [phone] If no answer, [phone] 04/23/2020, 3:47 PM

## 2020-04-23 NOTE — Progress Notes (Signed)
TR BAND REMOVAL  LOCATION:    Right radial  DEFLATED PER PROTOCOL:    Yes.    TIME BAND OFF / DRESSING APPLIED:    1420p A clean dry dressing with gauze and teagaderm with coban.  SITE UPON ARRIVAL:    Level 0  SITE AFTER BAND REMOVAL:    Level 0  CIRCULATION SENSATION AND MOVEMENT:    Within Normal Limits   Yes.    COMMENTS:   Care instructions given to patient

## 2020-04-23 NOTE — Progress Notes (Signed)
PROGRESS NOTE    Jillian Eaton  ZOX:[id number] DOB: August 19, 1950 DOA: 04/21/2020 PCP: [name], MD   Chief Complaint  Patient presents with  . 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  She reports seeing PCP and cardiologist recently, underwent radiographic work-up chest x-ray CT angio of the chest- neg for PE and received course of diuretics, but did not resolve her symptoms. She reports she was placed on oxygen by her PCP few days ago  In the ED chest x-ray no evidence of acute abnormalities BNP minimally elevated 167 troponin at 4. She was found to have leg edema.Patient complains of being very weak, tired, short of breath at rest and with activity, chest fullness. Patient was admitted cardiology was consulted. Also noted to be hypoglycemic reports she has had hypoglycemic for a week or so, placed on D10  Subjective: Seen and examined this morning. Slept well. Sugar low overnight needing to go up on D10 at 50 mlhr Feels better overall No shortness of breath this am Reports she saw the cardiologist and going for right heart cath  Assessment & Plan:  Acute chest pain with shortness of breath hypoxic respiratory failure, pulmonary hypertension/HFpEF: Having shortness of breath fatigue dyspnea with exertion for a few weeks.  Patient has an extensive work-up evaluation on outpatient by her cardiologist Gooding CT PE study 2/19 with no PE, but  showed evidence of pulmonary hypertension and small bilateral pleural effusion. TSH normal, minimal leg edema present, cardiology has been consulted, 2D echo done 3/3 with EF 55 to 60%, mild concentric LVH left ventricular diastolic parameters indeterminate mildly enlarged RV.  Per cardiology being planned for right/left heart cath today given concern for pulmonary hypertension group 3 likely related to intrinsic lung disease/OSA but has no firm diagnosis,her PFT from 03/01/2019 showed obstructive lung disease, ruling out left heart failure. Addendum-informed by Dr. Marisue Ivan that cardiac cath came back negative and has no pulmonary hypertension and he suggested to consult pulmonary as unable to figure out the etiology.  Suggested Lasix 20 mg as needed for fluid overload and okay to resume Eliquis tonight.  Chronic A. Fib Complete heart block Status post AV nodal ablation and [employer] pacemaker on 4/21 On Eliquis twice daily for anticoagulation, not on AV nodal blocker  Hypoglycemia: Patient reports having recent hypoglycemia in outpatient blood work as outpatient.  Has been hypoglycemic here needing D10 infusion. ?  Etiology likely in the setting of gastric bypass and also n.p.o. for cath-patient has not had great appetite/and seems particular about food. Increase oral intake once able to take po, dietitian consult.  We will do further work-up with add on to am sample-C-peptide BHOB, SFU level, insulin, proinsulin, insulin antibody, also  cosyntropin test in process-cortisol after stimulus is at least above 16 discussed with her endocrinologist Dr. Cruzita Lederer she reports the new Cortisol cut off is 14 and cosyntropin test is negative based on this am, she agrees with hypoglycemia work-up. Cont  CBG monitoring every 2 hour.  Increase D10 to further 75 mill per hour. Recent Labs  Lab 04/23/20 0148 04/23/20 4782 04/23/20 0545  04/23/20 0711 04/23/20 0948  GLUCAP 65* 112* 68* 73 59*   Hypotension: Hypotension  in 65/56 on presentation, needing IV fluid bolus and has resolved since then.  No recurrent hypotension.   Unintentional weight loss for past several months:TSH stable, had mammogram in December/21 normal.She is to undergo upper and lower endoscopy in several days as an outpatient with her gastroenterologist to rule out  GI malignancy.Dietitian consulted. Wt Readings from Last 3 Encounters:  04/22/20 78.6 kg  04/19/20 77.9 kg  04/07/20 84.3 kg   History of thyroid microcarcinoma postsurgical hypothyroidism/postsurgical hypocalcemia osteoporosis on Synthroid.tsh stable. On calcitriol, cont calcium. Corrected calcium 7.9-8.5 as albumin is low at 1.9-2  Mild intermittent asthma no exacerbation not wheezing.  CKD stage IIIa:Creatinine overall stable.Monitor.   Recent Labs  Lab 04/21/20 1556 04/22/20 0306 04/23/20 0357  BUN 26* 26* 25*  CREATININE 1.27* 1.20* 1.16*   Hypomagnesemia 1.6 cont mag oxide.  Insomnia continue her Lunesta.   Diet Order            Diet NPO time specified Except for: Sips with Meds  Diet effective midnight               Patient's Body mass index is 27.14 kg/m.  DVT prophylaxis: eliquis held for cath Code Status:   Code Status: Full Code  Family Communication: plan of care discussed with patient at bedside.  Status is: Inpatient  Remains inpatient appropriate because:Ongoing diagnostic testing needed not appropriate for outpatient work up, IV treatments appropriate due to intensity of illness or inability to take PO and Inpatient level of care appropriate due to severity of illness   Dispo: The patient is from: Home              Anticipated d/c is to: Home              Patient currently is not medically stable to d/c.   Difficult to place patient No    Unresulted Labs (From admission, onward)          Start     Ordered   04/23/20 0500  ACTH stimulation, 3 time points  (cosyntropin (CORTROSYN) test and labs)  Tomorrow morning,   R         04/22/20 1530   04/23/20 0500  Comprehensive metabolic panel  Daily,   R      04/22/20 1532   04/23/20 0500  CBC  Daily,   R      04/22/20 1532   04/22/20 0116  Blood gas, arterial  ONCE - STAT,   R       Comments: Room air please   Question:  Room air or oxygen  Answer:  Room air   04/22/20 0115   Pending  C-peptide  Once,   R        Pending   Pending  Insulin-like growth factor  Once,   R        Pending   Pending  Insulin, random  Once,   R        Pending          Medications reviewed:  Scheduled Meds: . calcitRIOL  0.5 mcg Oral Daily  . calcium carbonate  1 tablet Oral BID WC  . DULoxetine  60 mg Oral Daily  . folic acid  1 mg Oral Daily  . levothyroxine  137 mcg Oral Q0600  . magnesium oxide  400 mg Oral Daily  . methylPREDNISolone (SOLU-MEDROL) injection  125 mg Intravenous  Once  . mirtazapine  7.5 mg Oral QHS  . montelukast  10 mg Oral QHS  . sodium chloride flush  3 mL Intravenous Q12H   Continuous Infusions: . sodium chloride    . sodium chloride    . dextrose 50 mL/hr at 04/23/20 0238    Consultants:see note  Procedures:see note  Antimicrobials: Anti-infectives (From admission, onward)   None     Culture/Microbiology    Component Value Date/Time   SDES  08/01/2018 1514    URINE, CLEAN CATCH Performed at [facility], [place] [address] [place], [place] [address]    SPECREQUEST  08/01/2018 1514    NONE Performed at [facility], [place] [address] [place], [place] [address]    CULT >=100,000 COLONIES/mL ESCHERICHIA COLI (A) 08/01/2018 1514   REPTSTATUS 08/04/2018 FINAL 08/01/2018 1514    Other culture-see note  Objective: Vitals: Today's Vitals   04/22/20 2052 04/22/20 2116 04/22/20 2311 04/23/20 0544  BP:  103/70  108/77  Pulse:  (!) 59  62  Resp:  14  16  Temp:  (!) 97.3 F (36.3 C)  98 F (36.7 C)  TempSrc:      SpO2:  100%  100%  Weight:      PainSc: 0-No pain  0-No pain    No intake or output  data in the 24 hours ending 04/23/20 0742 Filed Weights   04/22/20 1922  Weight: 78.6 kg   Weight change:   Intake/Output from previous day: No intake/output data recorded. Intake/Output this shift: No intake/output data recorded. Filed Weights   04/22/20 1922  Weight: 78.6 kg    Examination: General exam: AAOx3, weak frail HEENT:Oral mucosa moist, Ear/Nose WNL grossly,dentition normal. Respiratory system: bilaterally diminished,no use of accessory muscle, non tender. Cardiovascular system: S1 & S2 +, regular, No JVD. Gastrointestinal system: Abdomen soft, NT,ND, BS+. Nervous System:Alert, awake, moving extremities and grossly nonfocal Extremities: trace leg edema, distal peripheral pulses palpable.  Skin: No rashes,no icterus. MSK: Normal muscle bulk,tone, power  Data Reviewed: I have personally reviewed following labs and imaging studies CBC: Recent Labs  Lab 04/21/20 1556 04/22/20 0306 04/22/20 0402 04/23/20 0357  WBC 4.7 3.8*  --  3.8*  NEUTROABS 3.2 2.0  --   --   HGB 10.5* 9.0* 8.8* 9.5*  HCT 33.8* 27.8* 26.0* 28.5*  MCV 99.7 98.9  --  96.3  PLT 175 149*  --  829   Basic Metabolic Panel: Recent Labs  Lab 04/21/20 1556 04/22/20 0306 04/22/20 0402 04/23/20 0357  NA 142 141 142 139  K 4.2 3.9 3.9 3.8  CL 110 112*  --  109  CO2 23 22  --  22  GLUCOSE 65* 67*  --  86  BUN 26* 26*  --  25*  CREATININE 1.27* 1.20*  --  1.16*  CALCIUM 6.9* 6.3*  --  6.7*  MG  --  1.6*  --   --    GFR: Estimated Creatinine Clearance: 50.8 mL/min (A) (by C-G formula based on SCr of 1.16 mg/dL (H)). Liver Function Tests: Recent Labs  Lab 04/22/20 0306 04/23/20 0357  AST 21 21  ALT 24 22  ALKPHOS 56 51  BILITOT 0.8 0.7  PROT 4.8* 4.4*  ALBUMIN 2.0* 1.9*   No results for input(s): LIPASE, AMYLASE in the last 168 hours. No results for input(s): AMMONIA in the last 168 hours. Coagulation Profile: No results for input(s): INR, PROTIME in the last 168 hours. Cardiac  Enzymes:  No results for input(s): CKTOTAL, CKMB, CKMBINDEX, TROPONINI in the last 168 hours. BNP (last 3 results) Recent Labs    06/13/19 1429 04/02/20 0941  PROBNP 251.0* 186.0*   HbA1C: No results for input(s): HGBA1C in the last 72 hours. CBG: Recent Labs  Lab 04/22/20 2344 04/23/20 0148 04/23/20 0313 04/23/20 0545 04/23/20 0711  GLUCAP 75 65* 112* 68* 73   Lipid Profile: No results for input(s): CHOL, HDL, LDLCALC, TRIG, CHOLHDL, LDLDIRECT in the last 72 hours. Thyroid Function Tests: Recent Labs    04/22/20 0306  TSH 4.487   Anemia Panel: No results for input(s): VITAMINB12, FOLATE, FERRITIN, TIBC, IRON, RETICCTPCT in the last 72 hours. Sepsis Labs: No results for input(s): PROCALCITON, LATICACIDVEN in the last 168 hours.  Recent Results (from the past 240 hour(s))  SARS CORONAVIRUS 2 (TAT 6-24 HRS) Nasopharyngeal Nasopharyngeal Swab     Status: None   Collection Time: 04/22/20  2:00 AM   Specimen: Nasopharyngeal Swab  Result Value Ref Range Status   SARS Coronavirus 2 NEGATIVE NEGATIVE Final    Comment: (NOTE) SARS-CoV-2 target nucleic acids are NOT DETECTED.  The SARS-CoV-2 RNA is generally detectable in upper and lower respiratory specimens during the acute phase of infection. Negative results do not preclude SARS-CoV-2 infection, do not rule out co-infections with other pathogens, and should not be used as the sole basis for treatment or other patient management decisions. Negative results must be combined with clinical observations, patient history, and epidemiological information. The expected result is Negative.  Fact Sheet for Patients: SugarRoll.be  Fact Sheet for Healthcare Providers: https://www.woods-mathews.com/  This test is not yet approved or cleared by the [place] FDA and  has been authorized for detection and/or diagnosis of SARS-CoV-2 by FDA under an Emergency Use Authorization (EUA).  This EUA will remain  in effect (meaning this test can be used) for the duration of the COVID-19 declaration under Se ction 564(b)(1) of the Act, 21 U.S.C. section 360bbb-3(b)(1), unless the authorization is terminated or revoked sooner.  Performed at [facility], [place] [address], [place], [place] [address]      Radiology Studies: DG Chest 2 View  Result Date: 04/21/2020 CLINICAL DATA:  Shortness of breath for 1 day EXAM: CHEST - 2 VIEW COMPARISON:  04/01/2020 FINDINGS: Pacing device is again noted. Cardiac enlargement is again seen and stable. The lungs are clear bilaterally. No acute bony abnormality is seen. Right shoulder replacement is noted. IMPRESSION: No acute abnormality noted.  Stable cardiomegaly is seen. Electronically Signed   By: Inez Catalina M.D.   On: 04/21/2020 16:54   ECHOCARDIOGRAM COMPLETE  Result Date: 04/22/2020    ECHOCARDIOGRAM REPORT   Patient Name:   BRAYAH URQUILLA Deweese Date of Exam: 04/22/2020 Medical Rec #:  [id number]                Height:       67.0 in Accession #:    [id number]               Weight:       171.8 lb Date of Birth:  [birth year]                BSA:          1.896 m Patient Age:    38 years                 BP:           108/76 mmHg Patient Gender: F  HR:           65 bpm. Exam Location:  Inpatient Procedure: 2D Echo, Cardiac Doppler and Color Doppler Indications:    Shortness of breath  History:        Patient has prior history of Echocardiogram examinations, most                 recent 07/08/2016. Pacemaker, Arrythmias:Atrial Fibrillation,                 Signs/Symptoms:Shortness of Breath; Risk Factors:Hypertension                 and Diabetes.  Sonographer:    Aldan Referring Phys: [id number] Warren  1. Left ventricular ejection fraction, by estimation, is 55 to 60%. The left ventricle has normal function. The left ventricle has no regional wall motion abnormalities. There is mild concentric  left ventricular hypertrophy. Left ventricular diastolic parameters are indeterminate.  2. Right ventricular systolic function is normal. The right ventricular size is mildly enlarged. There is normal pulmonary artery systolic pressure.  3. Left atrial size was severely dilated.  4. Right atrial size was severely dilated.  5. The mitral valve is grossly normal. Trivial mitral valve regurgitation.  6. Tricuspid valve regurgitation is moderate.  7. The aortic valve is tricuspid. There is mild calcification of the aortic valve. There is mild thickening of the aortic valve. Aortic valve regurgitation is mild. Comparison(s): No significant change from prior study. FINDINGS  Left Ventricle: Left ventricular ejection fraction, by estimation, is 55 to 60%. The left ventricle has normal function. The left ventricle has no regional wall motion abnormalities. The left ventricular internal cavity size was normal in size. There is  mild concentric left ventricular hypertrophy. Abnormal (paradoxical) septal motion, consistent with RV pacemaker. Left ventricular diastolic parameters are indeterminate. Right Ventricle: The right ventricular size is mildly enlarged. Right vetricular wall thickness was not well visualized. Right ventricular systolic function is normal. There is normal pulmonary artery systolic pressure. The tricuspid regurgitant velocity  is 2.51 m/s, and with an assumed right atrial pressure of 3 mmHg, the estimated right ventricular systolic pressure is 23.5 mmHg. Left Atrium: Left atrial size was severely dilated. Right Atrium: Right atrial size was severely dilated. Pericardium: There is no evidence of pericardial effusion. Mitral Valve: The mitral valve is grossly normal. There is mild thickening of the mitral valve leaflet(s). There is mild calcification of the mitral valve leaflet(s). Mild mitral annular calcification. Trivial mitral valve regurgitation. Tricuspid Valve: The tricuspid valve is normal in  structure. Tricuspid valve regurgitation is moderate. Aortic Valve: The aortic valve is tricuspid. There is mild calcification of the aortic valve. There is mild thickening of the aortic valve. Aortic valve regurgitation is mild. Aortic valve mean gradient measures 1.0 mmHg. Aortic valve peak gradient measures 2.2 mmHg. Aortic valve area, by VTI measures 4.16 cm. Pulmonic Valve: The pulmonic valve was normal in structure. Pulmonic valve regurgitation is trivial. Aorta: The aortic root and ascending aorta are structurally normal, with no evidence of dilitation. IAS/Shunts: No atrial level shunt detected by color flow Doppler. Additional Comments: A device lead is visualized.  LEFT VENTRICLE PLAX 2D LVIDd:         3.50 cm     Diastology LVIDs:         2.90 cm     LV e' medial:    11.30 cm/s LV PW:         1.50 cm  LV E/e' medial:  1.5 LV IVS:        1.40 cm     LV e' lateral:   7.72 cm/s LVOT diam:     2.40 cm     LV E/e' lateral: 2.2 LV SV:         57 LV SV Index:   30 LVOT Area:     4.52 cm  LV Volumes (MOD) LV vol d, MOD A2C: 72.7 ml LV vol d, MOD A4C: 75.1 ml LV vol s, MOD A2C: 34.5 ml LV vol s, MOD A4C: 27.0 ml LV SV MOD A2C:     38.2 ml LV SV MOD A4C:     75.1 ml LV SV MOD BP:      46.7 ml RIGHT VENTRICLE RV S prime:     13.90 cm/s TAPSE (M-mode): 1.5 cm LEFT ATRIUM            Index       RIGHT ATRIUM           Index LA diam:      3.90 cm  2.06 cm/m  RA Area:     22.60 cm LA Vol (A4C): 106.0 ml 55.91 ml/m RA Volume:   65.50 ml  34.55 ml/m  AORTIC VALVE                   PULMONIC VALVE AV Area (Vmax):    4.21 cm    PV Vmax:       0.38 m/s AV Area (Vmean):   3.83 cm    PV Peak grad:  0.6 mmHg AV Area (VTI):     4.16 cm AV Vmax:           75.00 cm/s AV Vmean:          53.700 cm/s AV VTI:            0.137 m AV Peak Grad:      2.2 mmHg AV Mean Grad:      1.0 mmHg LVOT Vmax:         69.80 cm/s LVOT Vmean:        45.500 cm/s LVOT VTI:          0.126 m LVOT/AV VTI ratio: 0.92  AORTA Ao Root diam: 3.20 cm Ao  Asc diam:  2.90 cm MITRAL VALVE               TRICUSPID VALVE MV Area (PHT): 5.54 cm    TR Peak grad:   25.2 mmHg MV Decel Time: 137 msec    TR Vmax:        251.00 cm/s MV E velocity: 16.70 cm/s MV A velocity: 62.10 cm/s  SHUNTS MV E/A ratio:  0.27        Systemic VTI:  0.13 m                            Systemic Diam: 2.40 cm Gwyndolyn Kaufman MD Electronically signed by Gwyndolyn Kaufman MD Signature Date/Time: 04/22/2020/5:20:59 PM    Final      LOS: 1 day   Antonieta Pert, MD [facility]  04/23/2020, 7:42 AM

## 2020-04-23 NOTE — Interval H&P Note (Signed)
History and Physical Interval Note:  04/23/2020 11:09 AM  Jillian Eaton  has presented today for surgery, with the diagnosis of pulmonary HTN, SOB.  The various methods of treatment have been discussed with the patient and family. After consideration of risks, benefits and other options for treatment, the patient has consented to  Procedure(s): RIGHT/LEFT HEART CATH AND CORONARY ANGIOGRAPHY (N/A) and possible coronary angiopalsty as a surgical intervention.  The patient's history has been reviewed, patient examined, no change in status, stable for surgery.  I have reviewed the patient's chart and labs.  Questions were answered to the patient's satisfaction.     Daniel Bensimhon

## 2020-04-23 NOTE — Progress Notes (Addendum)
Progress Note  Patient Name: Jillian Eaton [facility] Date of Encounter: 04/23/2020  Primary Cardiologist: Dr Lyman Bishop MD Electrophysiologist: Dr Cristopher Peru, MD  Subjective   Jillian Eaton reports some improvement in her breathing this morning (currently on room air). She does continue to have some chest pressure but overall feels well. She notes that she was able to get out of bed and shower this morning without significant fatigue. Does express some concern regarding her hypoglycemia.   Inpatient Medications    Scheduled Meds: . calcitRIOL  0.5 mcg Oral Daily  . calcium carbonate  1 tablet Oral BID WC  . DULoxetine  60 mg Oral Daily  . folic acid  1 mg Oral Daily  . levothyroxine  137 mcg Oral Q0600  . magnesium oxide  400 mg Oral Daily  . methylPREDNISolone (SOLU-MEDROL) injection  125 mg Intravenous Once  . mirtazapine  7.5 mg Oral QHS  . montelukast  10 mg Oral QHS  . sodium chloride flush  3 mL Intravenous Q12H   Continuous Infusions: . sodium chloride    . sodium chloride    . dextrose 75 mL/hr at 04/23/20 1013   PRN Meds: sodium chloride, acetaminophen, albuterol, famotidine, furosemide, ondansetron **OR** ondansetron (ZOFRAN) IV, polyethylene glycol, sodium chloride flush, traMADol, zolpidem   Vital Signs    Vitals:   04/22/20 1304 04/22/20 1922 04/22/20 2116 04/23/20 0544  BP: 108/76  103/70 108/77  Pulse: 61  (!) 59 62  Resp: 16  14 16   Temp: (!) 97.3 F (36.3 C)  (!) 97.3 F (36.3 C) 98 F (36.7 C)  TempSrc:      SpO2: 100%  100% 100%  Weight:  78.6 kg     No intake or output data in the 24 hours ending 04/23/20 1024 Filed Weights   04/22/20 1922  Weight: 78.6 kg    Telemetry    Ventricular paced rhythm, HR 60s- Personally Reviewed  ECG    Junctional, ventricular paced rhythm  - Personally Reviewed  Physical Exam  Physical Exam  Constitutional: Appears well-developed and well-nourished. No distress.  HENT: Normocephalic and  atraumatic, EOMI, conjunctiva normal, moist mucous membranes Cardiovascular: Normal rate, regular rhythm, S1 and S2 present, no murmurs, rubs, gallops.  Distal pulses intact Respiratory: No respiratory distress, no accessory muscle use.  Effort is normal.  Lungs are clear to auscultation bilaterally. GI: Nondistended, soft, nontender to palpation, active bowel sounds Musculoskeletal: Normal bulk and tone.  Nonpitting bilateral lower extremity edema Neurological: Is alert and oriented x4, no apparent focal deficits noted. Skin: Warm and dry.  No rash, erythema, lesions noted. Psychiatric: Normal mood and affect. Behavior is normal. Judgment and thought content normal.   Labs    Chemistry Recent Labs  Lab 04/21/20 1556 04/22/20 0306 04/22/20 0402 04/23/20 0357  NA 142 141 142 139  K 4.2 3.9 3.9 3.8  CL 110 112*  --  109  CO2 23 22  --  22  GLUCOSE 65* 67*  --  86  BUN 26* 26*  --  25*  CREATININE 1.27* 1.20*  --  1.16*  CALCIUM 6.9* 6.3*  --  6.7*  PROT  --  4.8*  --  4.4*  ALBUMIN  --  2.0*  --  1.9*  AST  --  21  --  21  ALT  --  24  --  22  ALKPHOS  --  56  --  51  BILITOT  --  0.8  --  0.7  GFRNONAA 46* 50*  --  52*  ANIONGAP 9 7  --  8     Hematology Recent Labs  Lab 04/21/20 1556 04/22/20 0306 04/22/20 0402 04/23/20 0357  WBC 4.7 3.8*  --  3.8*  RBC 3.39* 2.81*  --  2.96*  HGB 10.5* 9.0* 8.8* 9.5*  HCT 33.8* 27.8* 26.0* 28.5*  MCV 99.7 98.9  --  96.3  MCH 31.0 32.0  --  32.1  MCHC 31.1 32.4  --  33.3  RDW 16.6* 16.6*  --  16.5*  PLT 175 149*  --  152    Cardiac EnzymesNo results for input(s): TROPONINI in the last 168 hours. No results for input(s): TROPIPOC in the last 168 hours.   BNP Recent Labs  Lab 04/21/20 1556  BNP 167.9*     DDimer No results for input(s): DDIMER in the last 168 hours.   Radiology    DG Chest 2 View  Result Date: 04/21/2020 CLINICAL DATA:  Shortness of breath for 1 day EXAM: CHEST - 2 VIEW COMPARISON:  04/01/2020  FINDINGS: Pacing device is again noted. Cardiac enlargement is again seen and stable. The lungs are clear bilaterally. No acute bony abnormality is seen. Right shoulder replacement is noted. IMPRESSION: No acute abnormality noted.  Stable cardiomegaly is seen. Electronically Signed   By: Inez Catalina M.D.   On: 04/21/2020 16:54   ECHOCARDIOGRAM COMPLETE  Result Date: 04/22/2020    ECHOCARDIOGRAM REPORT   Patient Name:   Jillian Eaton [place] Date of Exam: 04/22/2020 Medical Rec #:  [id number]                Height:       67.0 in Accession #:    [id number]               Weight:       171.8 lb Date of Birth:  Aug 10, 1950                BSA:          1.896 m Patient Age:    70 years                 BP:           108/76 mmHg Patient Gender: F                        HR:           65 bpm. Exam Location:  Inpatient Procedure: 2D Echo, Cardiac Doppler and Color Doppler Indications:    Shortness of breath  History:        Patient has prior history of Echocardiogram examinations, most                 recent 07/08/2016. Pacemaker, Arrythmias:Atrial Fibrillation,                 Signs/Symptoms:Shortness of Breath; Risk Factors:Hypertension                 and Diabetes.  Sonographer:    Gillespie Referring Phys: [id number] Finger  1. Left ventricular ejection fraction, by estimation, is 55 to 60%. The left ventricle has normal function. The left ventricle has no regional wall motion abnormalities. There is mild concentric left ventricular hypertrophy. Left ventricular diastolic parameters are indeterminate.  2. Right ventricular systolic function is normal. The right ventricular size is mildly enlarged. There is normal pulmonary artery  systolic pressure.  3. Left atrial size was severely dilated.  4. Right atrial size was severely dilated.  5. The mitral valve is grossly normal. Trivial mitral valve regurgitation.  6. Tricuspid valve regurgitation is moderate.  7. The aortic valve is tricuspid. There  is mild calcification of the aortic valve. There is mild thickening of the aortic valve. Aortic valve regurgitation is mild. Comparison(s): No significant change from prior study. FINDINGS  Left Ventricle: Left ventricular ejection fraction, by estimation, is 55 to 60%. The left ventricle has normal function. The left ventricle has no regional wall motion abnormalities. The left ventricular internal cavity size was normal in size. There is  mild concentric left ventricular hypertrophy. Abnormal (paradoxical) septal motion, consistent with RV pacemaker. Left ventricular diastolic parameters are indeterminate. Right Ventricle: The right ventricular size is mildly enlarged. Right vetricular wall thickness was not well visualized. Right ventricular systolic function is normal. There is normal pulmonary artery systolic pressure. The tricuspid regurgitant velocity  is 2.51 m/s, and with an assumed right atrial pressure of 3 mmHg, the estimated right ventricular systolic pressure is 89.3 mmHg. Left Atrium: Left atrial size was severely dilated. Right Atrium: Right atrial size was severely dilated. Pericardium: There is no evidence of pericardial effusion. Mitral Valve: The mitral valve is grossly normal. There is mild thickening of the mitral valve leaflet(s). There is mild calcification of the mitral valve leaflet(s). Mild mitral annular calcification. Trivial mitral valve regurgitation. Tricuspid Valve: The tricuspid valve is normal in structure. Tricuspid valve regurgitation is moderate. Aortic Valve: The aortic valve is tricuspid. There is mild calcification of the aortic valve. There is mild thickening of the aortic valve. Aortic valve regurgitation is mild. Aortic valve mean gradient measures 1.0 mmHg. Aortic valve peak gradient measures 2.2 mmHg. Aortic valve area, by VTI measures 4.16 cm. Pulmonic Valve: The pulmonic valve was normal in structure. Pulmonic valve regurgitation is trivial. Aorta: The aortic root and  ascending aorta are structurally normal, with no evidence of dilitation. IAS/Shunts: No atrial level shunt detected by color flow Doppler. Additional Comments: A device lead is visualized.  LEFT VENTRICLE PLAX 2D LVIDd:         3.50 cm     Diastology LVIDs:         2.90 cm     LV e' medial:    11.30 cm/s LV PW:         1.50 cm     LV E/e' medial:  1.5 LV IVS:        1.40 cm     LV e' lateral:   7.72 cm/s LVOT diam:     2.40 cm     LV E/e' lateral: 2.2 LV SV:         57 LV SV Index:   30 LVOT Area:     4.52 cm  LV Volumes (MOD) LV vol d, MOD A2C: 72.7 ml LV vol d, MOD A4C: 75.1 ml LV vol s, MOD A2C: 34.5 ml LV vol s, MOD A4C: 27.0 ml LV SV MOD A2C:     38.2 ml LV SV MOD A4C:     75.1 ml LV SV MOD BP:      46.7 ml RIGHT VENTRICLE RV S prime:     13.90 cm/s TAPSE (M-mode): 1.5 cm LEFT ATRIUM            Index       RIGHT ATRIUM           Index LA  diam:      3.90 cm  2.06 cm/m  RA Area:     22.60 cm LA Vol (A4C): 106.0 ml 55.91 ml/m RA Volume:   65.50 ml  34.55 ml/m  AORTIC VALVE                   PULMONIC VALVE AV Area (Vmax):    4.21 cm    PV Vmax:       0.38 m/s AV Area (Vmean):   3.83 cm    PV Peak grad:  0.6 mmHg AV Area (VTI):     4.16 cm AV Vmax:           75.00 cm/s AV Vmean:          53.700 cm/s AV VTI:            0.137 m AV Peak Grad:      2.2 mmHg AV Mean Grad:      1.0 mmHg LVOT Vmax:         69.80 cm/s LVOT Vmean:        45.500 cm/s LVOT VTI:          0.126 m LVOT/AV VTI ratio: 0.92  AORTA Ao Root diam: 3.20 cm Ao Asc diam:  2.90 cm MITRAL VALVE               TRICUSPID VALVE MV Area (PHT): 5.54 cm    TR Peak grad:   25.2 mmHg MV Decel Time: 137 msec    TR Vmax:        251.00 cm/s MV E velocity: 16.70 cm/s MV A velocity: 62.10 cm/s  SHUNTS MV E/A ratio:  0.27        Systemic VTI:  0.13 m                            Systemic Diam: 2.40 cm Gwyndolyn Kaufman MD Electronically signed by Gwyndolyn Kaufman MD Signature Date/Time: 04/22/2020/5:20:59 PM    Final     Cardiac Studies  Echo 3/4>  IMPRESSIONS   1. Left ventricular ejection fraction, by estimation, is 55 to 60%. The  left ventricle has normal function. The left ventricle has no regional  wall motion abnormalities. There is mild concentric left ventricular  hypertrophy. Left ventricular diastolic  parameters are indeterminate.  2. Right ventricular systolic function is normal. The right ventricular  size is mildly enlarged. There is normal pulmonary artery systolic  pressure.  3. Left atrial size was severely dilated.  4. Right atrial size was severely dilated.  5. The mitral valve is grossly normal. Trivial mitral valve  regurgitation.  6. Tricuspid valve regurgitation is moderate.  7. The aortic valve is tricuspid. There is mild calcification of the  aortic valve. There is mild thickening of the aortic valve. Aortic valve  regurgitation is mild.   Patient Profile     70 y.o. 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   Assessment & Plan    Acute on chronic hypoxic respiratory failure: Hx of HFpEF without signs of volume overload. Prior Echo in 4/21 with pulmonary hypertension. She has history of restrictive lung disease but may also have OSA/intrinsic lung disease which could be contributing. Patient will benefit from [facility] to further evaluate her pulmonary hypertension.  She does not have a  history of prior/recurrent PE's or any known connective tissue disease. She is currently on RA but notes has required up to 3L O2 via [place].  - LHC/RHC today  - Consider high-resolution CT Chest  - Appears euvolemic, no diuresis at this time  Chronic atrial fibrillation s/p AV node ablation Complete heart block s/p PPM in 05/2019 CHADSc-VASc score 3. She is not currently on any AV nodal agents. She is on Eliquis  - Continue Eliquis 5mg  bid  Hypotension Improved  with IV fluid resuscitation. Likely in setting of decreased PO intake. - Continue to monitor    Signed, Harvie Heck, MD Internal Medicine, PGY-2 04/23/20 10:46 AM Pager # [phone]

## 2020-04-23 NOTE — Progress Notes (Signed)
Initial Nutrition Assessment  DOCUMENTATION CODES:   Not applicable  INTERVENTION:   Liberalize diet to REGULAR   Ensure Enlive po TID, each supplement provides 350 kcal and 20 grams of protein  MVI with minerals BID  Increase calcium carbonate 500 mg to TID   (MVI and calcium need to be taken at least two hours apart)  NUTRITION DIAGNOSIS:   Inadequate oral intake related to decreased appetite as evidenced by per patient/family report.  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 Presents this admission with CHF exacerbation.   Patient in cath lab at time of RD assessment. Unable to obtain nutrition history. Will attempt at later date if possible. RD to provide supplementation at correct MVI and calcium recommendations needed for gastric bypass.   Unsure of UBW. Records indicate patient weighed 93.8 kg on 7/26 and 78.6 kg this admission (16.2% wt loss in 8 months, significant for time frame). Suspect severe malnutrition but unable to diagnose at this time.   Patient at high risk for micronutrient deficiencies given hx of gastric bypass and prolonged poor PO intake.    Vitamin/MIneral Profile:  Thiamine B1: pending  Vitamin B12: 639 (wdl) Vitamin A: pending  Vitamin D: 68 (wdl) Vitamin C: pending  Copper: pending  Selenium: pending Zinc: pending   Drips: D10 @ 75 ml/hr  Medications: calcium carbonate BID, folic acid, mag-ox, solumedrol, remeron Labs: CBG 73-131  Diet Order:   Diet Order            Diet Heart Room service appropriate? Yes; Fluid consistency: Thin  Diet  effective now                 EDUCATION NEEDS:   Not appropriate for education at this time  Skin:  Skin Assessment: Reviewed RN Assessment  Last BM:  3/4  Height:   Ht Readings from Last 1 Encounters:  04/19/20 5\' 7"  (1.702 m)    Weight:   Wt Readings from Last 1 Encounters:  04/22/20 78.6 kg    BMI:  Body mass index is 27.14 kg/m.  Estimated Nutritional Needs:   Kcal:  2000-2200 kcal  Protein:  100-120 grams  Fluid:  >/= 2 L/day  Mariana Single RD, LDN Clinical Nutrition Pager listed in [place]

## 2020-04-23 NOTE — Telephone Encounter (Signed)
I have called the pt and LM on her VM

## 2020-04-24 ENCOUNTER — Telehealth: Payer: Self-pay | Admitting: Pulmonary Disease

## 2020-04-24 DIAGNOSIS — R0602 Shortness of breath: Secondary | ICD-10-CM | POA: Diagnosis not present

## 2020-04-24 LAB — CBC
HCT: 27.6 % — ABNORMAL LOW (ref 36.0–46.0)
Hemoglobin: 8.9 g/dL — ABNORMAL LOW (ref 12.0–15.0)
MCH: 31.1 pg (ref 26.0–34.0)
MCHC: 32.2 g/dL (ref 30.0–36.0)
MCV: 96.5 fL (ref 80.0–100.0)
Platelets: 145 10*3/uL — ABNORMAL LOW (ref 150–400)
RBC: 2.86 MIL/uL — ABNORMAL LOW (ref 3.87–5.11)
RDW: 15.9 % — ABNORMAL HIGH (ref 11.5–15.5)
WBC: 4.5 10*3/uL (ref 4.0–10.5)
nRBC: 0 % (ref 0.0–0.2)

## 2020-04-24 LAB — GLUCOSE, CAPILLARY
Glucose-Capillary: 56 mg/dL — ABNORMAL LOW (ref 70–99)
Glucose-Capillary: 66 mg/dL — ABNORMAL LOW (ref 70–99)
Glucose-Capillary: 68 mg/dL — ABNORMAL LOW (ref 70–99)
Glucose-Capillary: 74 mg/dL (ref 70–99)
Glucose-Capillary: 80 mg/dL (ref 70–99)
Glucose-Capillary: 83 mg/dL (ref 70–99)
Glucose-Capillary: 83 mg/dL (ref 70–99)
Glucose-Capillary: 86 mg/dL (ref 70–99)
Glucose-Capillary: 95 mg/dL (ref 70–99)

## 2020-04-24 LAB — COMPREHENSIVE METABOLIC PANEL
ALT: 25 U/L (ref 0–44)
AST: 22 U/L (ref 15–41)
Albumin: 1.9 g/dL — ABNORMAL LOW (ref 3.5–5.0)
Alkaline Phosphatase: 61 U/L (ref 38–126)
Anion gap: 7 (ref 5–15)
BUN: 22 mg/dL (ref 8–23)
CO2: 21 mmol/L — ABNORMAL LOW (ref 22–32)
Calcium: 6.9 mg/dL — ABNORMAL LOW (ref 8.9–10.3)
Chloride: 108 mmol/L (ref 98–111)
Creatinine, Ser: 1.15 mg/dL — ABNORMAL HIGH (ref 0.44–1.00)
GFR, Estimated: 52 mL/min — ABNORMAL LOW (ref >60)
Glucose, Bld: 124 mg/dL — ABNORMAL HIGH (ref 70–99)
Potassium: 3.7 mmol/L (ref 3.5–5.1)
Sodium: 136 mmol/L (ref 135–145)
Total Bilirubin: 0.6 mg/dL (ref 0.3–1.2)
Total Protein: 4.5 g/dL — ABNORMAL LOW (ref 6.5–8.1)

## 2020-04-24 LAB — C-PEPTIDE: C-Peptide: 4.7 ng/mL — ABNORMAL HIGH (ref 1.1–4.4)

## 2020-04-24 LAB — INSULIN, RANDOM: Insulin: 16.4 u[IU]/mL (ref 2.6–24.9)

## 2020-04-24 MED ORDER — CALCITRIOL 0.25 MCG PO CAPS
0.5000 ug | ORAL_CAPSULE | Freq: Two times a day (BID) | ORAL | Status: DC
  Filled 2020-04-24: qty 2

## 2020-04-24 MED ORDER — CALCITRIOL 0.25 MCG PO CAPS
0.2500 ug | ORAL_CAPSULE | Freq: Two times a day (BID) | ORAL | Status: DC
  Administered 2020-04-24 – 2020-05-04 (×20): 0.25 ug via ORAL
  Filled 2020-04-24 (×19): qty 1

## 2020-04-24 NOTE — Progress Notes (Addendum)
Patient CBG is 66, she just finished half of a veggie pizza. Patient refusing more PO intake at this time.  States she is full.  Will recheck CBG per protocol.  Patient reports feeling dizzy and losing balance at bedside, as well as feeling very tired all of a sudden prior to eating the pizza.

## 2020-04-24 NOTE — Telephone Encounter (Signed)
Received call from after-hours clinic messaging service.  Connected with patient via phone.  She is currently admitted in the hospital at [facility].  I am at [place].  She is asking about a high-res CT scan.  I relayed to her that high-res CT scans are best performed in the outpatient setting.  Ideally, we perform these tests when patients are doing well or at least not acutely decompensated as many things can skew the pictures and make them hard to interpret.  I personally reviewed her CTA PE protocol from 03/2020 to my eye shows no evidence of interstitial lung disease or fibrosis but does show bilateral pleural effusions concerning for cardiogenic volume overload.  Notably her esophagus is quite dilated throughout the thorax.  She has an upcoming appointment with our office 04/28/2020 and I encouraged her to discuss the utility of a high-res CT scan in the outpatient setting.  She expressed understanding and gratitude for the information.

## 2020-04-24 NOTE — Evaluation (Signed)
Physical Therapy Evaluation & Discharge Patient Details Name: Jillian Eaton MRN: [id number] DOB: [birth year] Today's Date: 04/24/2020   History of Present Illness  70 y/o female presented to ED on 3/2 for progressively worsening SOB over past 3-4 weeks. Recently PCP put her on 2L O2 [place] at home. COVID+ in January and has since had increased SOB with activities and decreased activity tolerance. 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 Patient overall modI for mobility. Educated patient on energy conservation techniques, patient verbalized understanding. Patient ambulated on RA with spO2 >95% throughout, 2/4 DOE. No skilled PT needs required acutely. No PT follow up recommended at this time.     Follow Up Recommendations No PT follow up    Equipment Recommendations  None recommended by PT    Recommendations for Other Services       Precautions / Restrictions Precautions Precautions: Fall Restrictions Weight Bearing Restrictions: No      Mobility  Bed Mobility               General bed mobility comments: Sitting EOB on arrival    Transfers Overall transfer level: Modified independent Equipment used: None                Ambulation/Gait Ambulation/Gait assistance: Modified independent (Device/Increase time) Gait Distance (Feet): 250 Feet Assistive device: None Gait Pattern/deviations: WFL(Within Functional Limits) Gait velocity: decreased   General Gait Details: slow steady pace, no LOB noted. spO2 >95% throughout on RA  Stairs            Wheelchair Mobility    Modified Rankin (Stroke Patients Only)       Balance Overall balance assessment: No apparent balance deficits (not formally assessed)                                            Pertinent Vitals/Pain Pain Assessment: No/denies pain    Home Living Family/patient expects to be discharged to:: Private residence Living Arrangements: Spouse/significant other Available Help at Discharge: Family Type of Home: Apartment Home Access: Level entry;Elevator     Home Layout: One level Home Equipment: Cane - single point      Prior Function Level of Independence: Independent         Comments: 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     Hand Dominance        Extremity/Trunk Assessment   Upper Extremity Assessment Upper Extremity Assessment: Overall WFL for tasks assessed    Lower Extremity Assessment Lower Extremity Assessment: Overall WFL for tasks assessed       Communication   Communication: No difficulties  Cognition Arousal/Alertness: Awake/alert Behavior During Therapy: WFL for tasks assessed/performed Overall Cognitive Status: Within Functional Limits for tasks assessed                                        General Comments      Exercises     Assessment/Plan    PT Assessment Patent does not need any further PT services  PT Problem List         PT Treatment Interventions  PT Goals (Current goals can be found in the Care Plan section)  Acute Rehab PT Goals Patient Stated Goal: to go home PT Goal Formulation: With patient    Frequency     Barriers to discharge        Co-evaluation               AM-PAC PT "6 Clicks" Mobility  Outcome Measure Help needed turning from your back to your side while in a flat bed without using bedrails?: None Help needed moving from lying on your back to sitting on the side of a flat bed without using bedrails?: None Help needed moving to and from a bed to a chair (including a wheelchair)?: None Help needed standing up from a chair using your arms (e.g., wheelchair or bedside chair)?: None Help needed to walk in  hospital room?: None Help needed climbing 3-5 steps with a railing? : None 6 Click Score: 24    End of Session   Activity Tolerance: Patient tolerated treatment well Patient left: in chair;with call bell/phone within reach Nurse Communication: Mobility status PT Visit Diagnosis: Muscle weakness (generalized) (M62.81)    Time: 4098-1191 PT Time Calculation (min) (ACUTE ONLY): 16 min   Charges:   PT Evaluation $PT Eval Low Complexity: 1 Low          Urho Rio A. Dan Humphreys PT, DPT Acute Rehabilitation Services Pager [phone] Office [phone]   Viviann Spare 04/24/2020, 2:12 PM

## 2020-04-24 NOTE — Progress Notes (Signed)
PROGRESS NOTE    Jillian Eaton  OQH:[id number] DOB: August 10, 1950 DOA: 04/21/2020 PCP: [name], MD   Chief Complaint  Patient presents with  . 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  She reports seeing PCP and cardiologist recently, underwent radiographic work-up chest x-ray CT angio of the chest- neg for PE and received course of diuretics, but did not resolve her symptoms. She reports she was placed on oxygen by her PCP few days ago  In the ED chest x-ray no evidence of acute abnormalities BNP minimally elevated 167 troponin at 4. She was found to have leg edema.Patient complains of being very weak, tired, short of breath at rest and with activity, chest fullness. Patient was admitted cardiology was consulted. Also noted to be hypoglycemic reports she has had hypoglycemic for a week or so, placed on D10 Patient underwent right heart cath that showed no evidence of pulmonary hypertension and no increased left-sided filling pressure-shortness of breath hypoxia not explained by congestive heart failure so advised PCCM consulted.   Subjective: Seen this morning.  Patient reports he feels much better today ate almost half of her breakfast, still remains on D10, she is not needing oxygen anymore but has not ambulated yet.  Assessment & Plan:  Acute chest pain with shortness of breath/ hypoxic respiratory failure, pulmonary hypertension in  imaging/HFpEF: Having shortness of breath fatigue dyspnea with exertion for few weeks.  Patient has an extensive work-up evaluation on outpatient by her cardiologist Gooding CT PE study 2/19 with no PE, but showed evidence of pulmonary hypertension and small bilateral pleural effusion. TSH normal, minimal leg edema present, cardiology has been consulted, 2D echo done 3/3 with EF 55 to 60%, mild concentric LVH left ventricular diastolic parameters indeterminate mildly enlarged RV. her PFT from 03/01/2019 showed obstructive lung disease s/p right heart cath 04/23/20-that showed no evidence of pulmonary hypertension and no increased left-sided filling pressure-shortness of breath hypoxia not explained by congestive heart failure (euvolemic.  She was seen by pulmonary and could be due to asthma/possible vocal cord dysfunction.  Currently not needing oxygen pulmonary has advised outpatient follow-up and also ENT evaluation as outpatient.  Ambulate her today and check oxygen.  Patient is requesting HRCT inpatient (pulm has deferred to OP- informed patient), will ambulate and keep her off oxygen.may use Lasix 20 mg as needed for fluid overload. ?steroid trial to see if her symptoms improve.  Chronic A. Fib Complete heart block Status post AV nodal ablation and [employer] pacemaker on 4/21 Continue on Eliquis.  Not on AV nodal blocker  Hypoglycemia: Patient reports having recent hypoglycemia in outpatient blood work as outpatient.  Has been hypoglycemic here needing D10 infusion. ?  Etiology likely in the setting of gastric bypass,has not had great appetite/and seems particular about food.  Dietitian consulted.cosyntropin test NEGATIVE- I discussed with her endocrinologist Dr. Cruzita Lederer she reports the new Cortisol cut off is 14 and cosyntropin test is negative.  Discussed w/ Endocrine- we ordered C peptide BHOB, SFU level, insulin, proinsulin, insulin antibody < monitor CBG monitoring q 2 hour and cont D10 75 mill  per  hour-p.o. intake picking up, blood sugar remains above 100 RN to cut D10 by half and cont to wean down. Recent Labs  Lab 04/23/20 1242 04/23/20 1456 04/23/20 1656 04/23/20 2130 04/24/20 0440  GLUCAP 80 131* 96 121* 68*   Hypotension: Hypotension in 65/56 on presentation, needing IV fluid bolus and has resolved since then.  Monitor.  Unintentional weight loss for past several months:TSH stable, had mammogram in December/21 normal.She is to undergo upper and lower endoscopy in several days as an outpatient with her gastroenterologist to rule out  GI malignancy. She needs to see her GI- please provide instruction at discharge.  Dietitian consulted multiple vitamin levels pending, augment diet/nutrition supplement. 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 ?steroid trial. Prn albuterol. OP f/u with pulm  CKD stage IIIa: Renal function stable.   Recent Labs  Lab 04/21/20 1556 04/22/20 0306 04/23/20 0357 04/24/20 0509  BUN 26* 26* 25* 22  CREATININE 1.27* 1.20* 1.16* 1.15*   Hypomagnesemia  continue magnesium oxide.  Insomnia continue her Lunesta.  Diet Order            Diet regular Room service appropriate? Yes; Fluid consistency: Thin  Diet effective now               Patient's Body mass index is 27.14 kg/m.  DVT prophylaxis: ELIQUIS Code Status:   Code Status: Full Code  Family Communication: plan of care discussed with patient at bedside.  Status is: Inpatient Remains inpatient appropriate because:Ongoing diagnostic testing needed not appropriate for outpatient work up, IV treatments appropriate due to intensity of illness or inability to take PO and Inpatient level of care appropriate due to  severity of illness   Dispo: The patient is from: Home              Anticipated d/c is to: Home hopefully 1-2 days once off D10.              Patient currently is not medically stable to d/c.   Difficult to place patient No    Unresulted Labs (From admission, onward)          Start     Ordered   04/23/20 1645  Vitamin A  Once,   R        04/23/20 1644   04/23/20 1645  Vitamin C  Once,   R        04/23/20 1644   04/23/20 1645  Copper, serum  Once,   R        04/23/20 1644   04/23/20 1645  Zinc  Once,   R        04/23/20 1644   04/23/20 1644  Vitamin B1  Once,   R        04/23/20 1644   04/23/20 0959  Insulin antibodies, blood  Add-on,   AD        04/23/20 1000   04/23/20 0957  C-peptide  Add-on,   AD        04/23/20 1000   04/23/20 0957  Insulin, random  Add-on,   AD        04/23/20 1000   04/23/20 0957  Proinsulin/insulin ratio  Add-on,   AD        04/23/20  1000   04/23/20 0957  Sulfonylurea Hypoglycemics Panel, blood  Add-on,   AD        04/23/20 1000   04/23/20 0500  Comprehensive metabolic panel  Daily,   R      04/22/20 1532   04/23/20 0500  CBC  Daily,   R      04/22/20 1532   04/22/20 0116  Blood gas, arterial  ONCE - STAT,   R       Comments: Room air please   Question:  Room air or oxygen  Answer:  Room air   04/22/20 0115          Medications reviewed:  Scheduled Meds: . apixaban  5 mg Oral BID  . calcitRIOL  0.5 mcg Oral Q supper  . calcium carbonate  1 tablet Oral BID AC  . DULoxetine  60 mg Oral Daily  . feeding supplement  237 mL Oral TID BM  . folic acid  1 mg Oral Daily  . levothyroxine  137 mcg Oral Q0600  . magnesium oxide  400 mg Oral Daily  . methylPREDNISolone (SOLU-MEDROL) injection  125 mg Intravenous Once  . mirtazapine  7.5 mg Oral QHS  . montelukast  10 mg Oral QHS  . multivitamin with minerals  1 tablet Oral BID   Continuous Infusions: . dextrose 75 mL/hr at 04/24/20 0548    Consultants:see note  Procedures:see  note  Antimicrobials: Anti-infectives (From admission, onward)   None     Objective: Vitals: Today's Vitals   04/23/20 1439 04/23/20 2132 04/23/20 2232 04/24/20 0425  BP: (!) 137/96 (!) 85/62  124/76  Pulse: 61 61  64  Resp: 16 18  20   Temp:  98.7 F (37.1 C)  98.5 F (36.9 C)  TempSrc:  Oral  Oral  SpO2: 100% 100%  97%  Weight:      PainSc:   Asleep     Intake/Output Summary (Last 24 hours) at 04/24/2020 0732 Last data filed at 04/24/2020 0400 Gross per 24 hour  Intake 1803.99 ml  Output --  Net 1803.99 ml   Filed Weights   04/22/20 1922  Weight: 78.6 kg   Weight change:   Intake/Output from previous day: 03/04 0701 - 03/05 0700 In: 6967 [P.O.:240; I.V.:1564] Out: -  Intake/Output this shift: No intake/output data recorded. Filed Weights   04/22/20 1922  Weight: 78.6 kg    Examination: General exam: AAOX3, pleasant, on room air,NAD, weak appearing. HEENT:Oral mucosa moist, Ear/Nose WNL grossly, dentition normal. Respiratory system: bilaterally clear,no wheezing or crackles,no use of accessory muscle Cardiovascular system: S1 & S2 +, No JVD,. Gastrointestinal system: Abdomen soft, NT,ND, BS+ Nervous System:Alert, awake, moving extremities and grossly nonfocal Extremities: No edema, distal peripheral pulses palpable.  Skin: No rashes,no icterus. MSK: Normal muscle bulk,tone, power  Data Reviewed: I have personally reviewed following labs and imaging studies CBC: Recent Labs  Lab 04/21/20 1556 04/22/20 0306 04/22/20 0402 04/23/20 0357 04/23/20 1150 04/23/20 1201 04/24/20 0509  WBC 4.7 3.8*  --  3.8*  --   --  4.5  NEUTROABS 3.2 2.0  --   --   --   --   --   HGB 10.5* 9.0* 8.8* 9.5* 9.2* 9.2*  9.5* 8.9*  HCT 33.8* 27.8* 26.0* 28.5* 27.0* 27.0*  28.0* 27.6*  MCV 99.7 98.9  --  96.3  --   --  96.5  PLT 175 149*  --  152  --   --  145*  Basic Metabolic Panel: Recent Labs  Lab 04/21/20 1556 04/22/20 0306 04/22/20 0402 04/23/20 0357  04/23/20 1150 04/23/20 1201 04/24/20 0509  NA 142 141 142 139 141 140  140 136  K 4.2 3.9 3.9 3.8 3.9 4.1  4.1 3.7  CL 110 112*  --  109  --   --  108  CO2 23 22  --  22  --   --  21*  GLUCOSE 65* 67*  --  86  --   --  124*  BUN 26* 26*  --  25*  --   --  22  CREATININE 1.27* 1.20*  --  1.16*  --   --  1.15*  CALCIUM 6.9* 6.3*  --  6.7*  --   --  6.9*  MG  --  1.6*  --   --   --   --   --    GFR: Estimated Creatinine Clearance: 51.3 mL/min (A) (by C-G formula based on SCr of 1.15 mg/dL (H)). Liver Function Tests: Recent Labs  Lab 04/22/20 0306 04/23/20 0357 04/24/20 0509  AST 21 21 22   ALT 24 22 25   ALKPHOS 56 51 61  BILITOT 0.8 0.7 0.6  PROT 4.8* 4.4* 4.5*  ALBUMIN 2.0* 1.9* 1.9*   No results for input(s): LIPASE, AMYLASE in the last 168 hours. No results for input(s): AMMONIA in the last 168 hours. Coagulation Profile: No results for input(s): INR, PROTIME in the last 168 hours. Cardiac Enzymes: No results for input(s): CKTOTAL, CKMB, CKMBINDEX, TROPONINI in the last 168 hours. BNP (last 3 results) Recent Labs    06/13/19 1429 04/02/20 0941  PROBNP 251.0* 186.0*   HbA1C: No results for input(s): HGBA1C in the last 72 hours. CBG: Recent Labs  Lab 04/23/20 1242 04/23/20 1456 04/23/20 1656 04/23/20 2130 04/24/20 0440  GLUCAP 80 131* 96 121* 68*   Lipid Profile: No results for input(s): CHOL, HDL, LDLCALC, TRIG, CHOLHDL, LDLDIRECT in the last 72 hours. Thyroid Function Tests: Recent Labs    04/22/20 0306  TSH 4.487   Anemia Panel: No results for input(s): VITAMINB12, FOLATE, FERRITIN, TIBC, IRON, RETICCTPCT in the last 72 hours. Sepsis Labs: No results for input(s): PROCALCITON, LATICACIDVEN in the last 168 hours.  Recent Results (from the past 240 hour(s))  SARS CORONAVIRUS 2 (TAT 6-24 HRS) Nasopharyngeal Nasopharyngeal Swab     Status: None   Collection Time: 04/22/20  2:00 AM   Specimen: Nasopharyngeal Swab  Result Value Ref Range Status    SARS Coronavirus 2 NEGATIVE NEGATIVE Final    Comment: (NOTE) SARS-CoV-2 target nucleic acids are NOT DETECTED.  The SARS-CoV-2 RNA is generally detectable in upper and lower respiratory specimens during the acute phase of infection. Negative results do not preclude SARS-CoV-2 infection, do not rule out co-infections with other pathogens, and should not be used as the sole basis for treatment or other patient management decisions. Negative results must be combined with clinical observations, patient history, and epidemiological information. The expected result is Negative.  Fact Sheet for Patients: SugarRoll.be  Fact Sheet for Healthcare Providers: https://www.woods-mathews.com/  This test is not yet approved or cleared by the [place] FDA and  has been authorized for detection and/or diagnosis of SARS-CoV-2 by FDA under an Emergency Use Authorization (EUA). This EUA will remain  in effect (meaning this test can be used) for the duration of the COVID-19 declaration under Se ction 564(b)(1) of the Act, 21 U.S.C. section 360bbb-3(b)(1), unless the authorization is terminated or revoked  sooner.  Performed at [facility], [place] [address], [place], [place] [address]      Radiology Studies: CARDIAC CATHETERIZATION  Result Date: 04/23/2020 Findings: Ao = 88/57 (71) LV = 82/2 RA = 1 RV = 17/1 PA = 21/5 (12) PCW = 6 Fick cardiac output/index = 4.0/2.0 PVR = 1.0 WU Ao sat = 96% PA sat = 62%, 62% Assessment: 1. Very ectatic but widely patent coronary arteries. No significant coronary stenoses. 2. LVEF 60% 3. Normal PA pressures with low left-sided filling pressures Plan/Discussion: Medical therapy. Glori Bickers, MD 12:55 PM   ECHOCARDIOGRAM COMPLETE  Result Date: 04/22/2020    ECHOCARDIOGRAM REPORT   Patient Name:   AVANTIKA SHERE [place] Date of Exam: 04/22/2020 Medical Rec #:  [id number]                Height:       67.0 in Accession  #:    [id number]               Weight:       171.8 lb Date of Birth:  September 05, 1950                BSA:          1.896 m Patient Age:    14 years                 BP:           108/76 mmHg Patient Gender: F                        HR:           65 bpm. Exam Location:  Inpatient Procedure: 2D Echo, Cardiac Doppler and Color Doppler Indications:    Shortness of breath  History:        Patient has prior history of Echocardiogram examinations, most                 recent 07/08/2016. Pacemaker, Arrythmias:Atrial Fibrillation,                 Signs/Symptoms:Shortness of Breath; Risk Factors:Hypertension                 and Diabetes.  Sonographer:    [place] Referring Phys: [id number] [place]  1. Left ventricular ejection fraction, by estimation, is 55 to 60%. The left ventricle has normal function. The left ventricle has no regional wall motion abnormalities. There is mild concentric left ventricular hypertrophy. Left ventricular diastolic parameters are indeterminate.  2. Right ventricular systolic function is normal. The right ventricular size is mildly enlarged. There is normal pulmonary artery systolic pressure.  3. Left atrial size was severely dilated.  4. Right atrial size was severely dilated.  5. The mitral valve is grossly normal. Trivial mitral valve regurgitation.  6. Tricuspid valve regurgitation is moderate.  7. The aortic valve is tricuspid. There is mild calcification of the aortic valve. There is mild thickening of the aortic valve. Aortic valve regurgitation is mild. Comparison(s): No significant change from prior study. FINDINGS  Left Ventricle: Left ventricular ejection fraction, by estimation, is 55 to 60%. The left ventricle has normal function. The left ventricle has no regional wall motion abnormalities. The left ventricular internal cavity size was normal in size. There is  mild concentric left ventricular hypertrophy. Abnormal (paradoxical) septal motion, consistent with RV  pacemaker. Left ventricular diastolic parameters are indeterminate. Right Ventricle: The  right ventricular size is mildly enlarged. Right vetricular wall thickness was not well visualized. Right ventricular systolic function is normal. There is normal pulmonary artery systolic pressure. The tricuspid regurgitant velocity  is 2.51 m/s, and with an assumed right atrial pressure of 3 mmHg, the estimated right ventricular systolic pressure is 17.5 mmHg. Left Atrium: Left atrial size was severely dilated. Right Atrium: Right atrial size was severely dilated. Pericardium: There is no evidence of pericardial effusion. Mitral Valve: The mitral valve is grossly normal. There is mild thickening of the mitral valve leaflet(s). There is mild calcification of the mitral valve leaflet(s). Mild mitral annular calcification. Trivial mitral valve regurgitation. Tricuspid Valve: The tricuspid valve is normal in structure. Tricuspid valve regurgitation is moderate. Aortic Valve: The aortic valve is tricuspid. There is mild calcification of the aortic valve. There is mild thickening of the aortic valve. Aortic valve regurgitation is mild. Aortic valve mean gradient measures 1.0 mmHg. Aortic valve peak gradient measures 2.2 mmHg. Aortic valve area, by VTI measures 4.16 cm. Pulmonic Valve: The pulmonic valve was normal in structure. Pulmonic valve regurgitation is trivial. Aorta: The aortic root and ascending aorta are structurally normal, with no evidence of dilitation. IAS/Shunts: No atrial level shunt detected by color flow Doppler. Additional Comments: A device lead is visualized.  LEFT VENTRICLE PLAX 2D LVIDd:         3.50 cm     Diastology LVIDs:         2.90 cm     LV e' medial:    11.30 cm/s LV PW:         1.50 cm     LV E/e' medial:  1.5 LV IVS:        1.40 cm     LV e' lateral:   7.72 cm/s LVOT diam:     2.40 cm     LV E/e' lateral: 2.2 LV SV:         57 LV SV Index:   30 LVOT Area:     4.52 cm  LV Volumes (MOD) LV vol d, MOD  A2C: 72.7 ml LV vol d, MOD A4C: 75.1 ml LV vol s, MOD A2C: 34.5 ml LV vol s, MOD A4C: 27.0 ml LV SV MOD A2C:     38.2 ml LV SV MOD A4C:     75.1 ml LV SV MOD BP:      46.7 ml RIGHT VENTRICLE RV S prime:     13.90 cm/s TAPSE (M-mode): 1.5 cm LEFT ATRIUM            Index       RIGHT ATRIUM           Index LA diam:      3.90 cm  2.06 cm/m  RA Area:     22.60 cm LA Vol (A4C): 106.0 ml 55.91 ml/m RA Volume:   65.50 ml  34.55 ml/m  AORTIC VALVE                   PULMONIC VALVE AV Area (Vmax):    4.21 cm    PV Vmax:       0.38 m/s AV Area (Vmean):   3.83 cm    PV Peak grad:  0.6 mmHg AV Area (VTI):     4.16 cm AV Vmax:           75.00 cm/s AV Vmean:          53.700 cm/s AV VTI:  0.137 m AV Peak Grad:      2.2 mmHg AV Mean Grad:      1.0 mmHg LVOT Vmax:         69.80 cm/s LVOT Vmean:        45.500 cm/s LVOT VTI:          0.126 m LVOT/AV VTI ratio: 0.92  AORTA Ao Root diam: 3.20 cm Ao Asc diam:  2.90 cm MITRAL VALVE               TRICUSPID VALVE MV Area (PHT): 5.54 cm    TR Peak grad:   25.2 mmHg MV Decel Time: 137 msec    TR Vmax:        251.00 cm/s MV E velocity: 16.70 cm/s MV A velocity: 62.10 cm/s  SHUNTS MV E/A ratio:  0.27        Systemic VTI:  0.13 m                            Systemic Diam: 2.40 cm Gwyndolyn Kaufman MD Electronically signed by Gwyndolyn Kaufman MD Signature Date/Time: 04/22/2020/5:20:59 PM    Final      LOS: 2 days   Antonieta Pert, MD [facility]  04/24/2020, 7:32 AM

## 2020-04-25 ENCOUNTER — Encounter: Payer: Self-pay | Admitting: Internal Medicine

## 2020-04-25 DIAGNOSIS — R0602 Shortness of breath: Secondary | ICD-10-CM | POA: Diagnosis not present

## 2020-04-25 LAB — CBC
HCT: 24.7 % — ABNORMAL LOW (ref 36.0–46.0)
Hemoglobin: 8.2 g/dL — ABNORMAL LOW (ref 12.0–15.0)
MCH: 31.8 pg (ref 26.0–34.0)
MCHC: 33.2 g/dL (ref 30.0–36.0)
MCV: 95.7 fL (ref 80.0–100.0)
Platelets: 144 10*3/uL — ABNORMAL LOW (ref 150–400)
RBC: 2.58 MIL/uL — ABNORMAL LOW (ref 3.87–5.11)
RDW: 16 % — ABNORMAL HIGH (ref 11.5–15.5)
WBC: 4.1 10*3/uL (ref 4.0–10.5)
nRBC: 0 % (ref 0.0–0.2)

## 2020-04-25 LAB — COMPREHENSIVE METABOLIC PANEL
ALT: 24 U/L (ref 0–44)
AST: 21 U/L (ref 15–41)
Albumin: 1.8 g/dL — ABNORMAL LOW (ref 3.5–5.0)
Alkaline Phosphatase: 54 U/L (ref 38–126)
Anion gap: 6 (ref 5–15)
BUN: 26 mg/dL — ABNORMAL HIGH (ref 8–23)
CO2: 22 mmol/L (ref 22–32)
Calcium: 7 mg/dL — ABNORMAL LOW (ref 8.9–10.3)
Chloride: 109 mmol/L (ref 98–111)
Creatinine, Ser: 1.07 mg/dL — ABNORMAL HIGH (ref 0.44–1.00)
GFR, Estimated: 57 mL/min — ABNORMAL LOW (ref >60)
Glucose, Bld: 75 mg/dL (ref 70–99)
Potassium: 4 mmol/L (ref 3.5–5.1)
Sodium: 137 mmol/L (ref 135–145)
Total Bilirubin: 0.5 mg/dL (ref 0.3–1.2)
Total Protein: 4.2 g/dL — ABNORMAL LOW (ref 6.5–8.1)

## 2020-04-25 LAB — HEMOGLOBIN A1C
Hgb A1c MFr Bld: 4 % — ABNORMAL LOW (ref 4.8–5.6)
Mean Plasma Glucose: 68.1 mg/dL

## 2020-04-25 LAB — GLUCOSE, CAPILLARY
Glucose-Capillary: 73 mg/dL (ref 70–99)
Glucose-Capillary: 74 mg/dL (ref 70–99)
Glucose-Capillary: 70 mg/dL (ref 70–99)
Glucose-Capillary: 58 mg/dL — ABNORMAL LOW (ref 70–99)
Glucose-Capillary: 72 mg/dL (ref 70–99)
Glucose-Capillary: 79 mg/dL (ref 70–99)
Glucose-Capillary: 95 mg/dL (ref 70–99)
Glucose-Capillary: 108 mg/dL — ABNORMAL HIGH (ref 70–99)

## 2020-04-25 LAB — COPPER, SERUM: Copper: 44 ug/dL — ABNORMAL LOW (ref 80–158)

## 2020-04-25 LAB — ZINC: Zinc: 45 ug/dL (ref 44–115)

## 2020-04-25 MED ORDER — DOCUSATE SODIUM 100 MG PO CAPS
100.0000 mg | ORAL_CAPSULE | Freq: Two times a day (BID) | ORAL | Status: DC
  Administered 2020-04-25 – 2020-05-04 (×19): 100 mg via ORAL
  Filled 2020-04-25 (×19): qty 1

## 2020-04-25 MED ORDER — BISACODYL 5 MG PO TBEC: 10.0000 mg | DELAYED_RELEASE_TABLET | Freq: Every evening | ORAL | Status: DC | PRN

## 2020-04-25 NOTE — Progress Notes (Signed)
PROGRESS NOTE    Jillian Eaton  IWL:[id number] DOB: Mar 14, 1950 DOA: 04/21/2020 PCP: [name], MD   Chief Complaint  Patient presents with  . 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  She reports seeing PCP and cardiologist recently, underwent radiographic work-up chest x-ray CT angio of the chest- neg for PE and received course of diuretics, but did not resolve her symptoms. She reports she was placed on oxygen by her PCP few days ago  In the ED chest x-ray no evidence of acute abnormalities BNP minimally elevated 167 troponin at 4. She was found to have leg edema.Patient complains of being very weak, tired, short of breath at rest and with activity, chest fullness. Patient was admitted cardiology was consulted. Also noted to be hypoglycemic reports she has had hypoglycemic for a week or so, placed on D10 Patient underwent right heart cath that showed no evidence of pulmonary hypertension and no increased left-sided filling pressure-shortness of breath hypoxia not explained by congestive heart failure so advised PCCM consulted.   Subjective: Patient seen and examined. Feeling much better. No more shortness of breath while at rest. She tells me that her breathing has improved significantly. Only concern at this point in time she has is her hypoglycemia. She is currently on 75 cc/h of D10. She has no complaints.  Assessment & Plan:  Acute chest  pain with shortness of breath/ hypoxic respiratory failure, pulmonary hypertension in imaging/HFpEF: Having shortness of breath fatigue dyspnea with exertion for few weeks.  Patient has an extensive work-up evaluation on outpatient by her cardiologist Gooding CT PE study 2/19 with no PE, but showed evidence of pulmonary hypertension and small bilateral pleural effusion. TSH normal, minimal leg edema present, cardiology has been consulted, 2D echo done 3/3 with EF 55 to 60%, mild concentric LVH left ventricular diastolic parameters indeterminate mildly enlarged RV. her PFT from 03/01/2019 showed obstructive lung disease s/p right heart cath 04/23/20-that showed no evidence of pulmonary hypertension and no increased left-sided filling pressure-shortness of breath hypoxia not explained by congestive heart failure (euvolemic.  She was seen by pulmonary and could be due to asthma/possible vocal cord dysfunction.  Currently not needing oxygen pulmonary has advised outpatient follow-up and also ENT evaluation as outpatient. She is much better. Not hypoxic. No shortness of breath.  Chronic A. Fib Complete heart block Status post AV nodal ablation and [employer] pacemaker on 4/21 Continue on Eliquis.  Not on AV nodal blocker  Hypoglycemia: Patient reports having recent hypoglycemia in outpatient blood work as outpatient.  Has been hypoglycemic here needing D10 infusion. ?  Etiology likely in the setting of gastric bypass,has not had great appetite/and seems particular about food.  Dietitian consulted.cosyntropin test NEGATIVE- I discussed with her endocrinologist Dr. Cruzita Lederer she reports the new Cortisol cut off is 14 and cosyntropin test is negative.  Discussed w/ Endocrine- we ordered C peptide BHOB, SFU level, insulin, proinsulin, insulin antibody < monitor CBG in an effort to wean her down from dextrose, I will reduce her frequency to 40 cc/h. Monitor CBG every 2 hours for 3-4 times and if  stable, plan to stop dextrose  altogether and watch her off of that. Discussed with her in length. She is in agreement. Recent Labs  Lab 04/25/20 0213 04/25/20 0421 04/25/20 0643 04/25/20 0833 04/25/20 1042  GLUCAP 73 74 70 58* 72   Hypotension: Hypotension in 65/56 on presentation, needing IV fluid bolus and has resolved since then.  Monitor.  Unintentional weight loss for past several months:TSH stable, had mammogram in December/21 normal.She is to undergo upper and lower endoscopy in several days as an outpatient with her gastroenterologist to rule out  GI malignancy. She needs to see her GI- please provide instruction at discharge.  Dietitian consulted multiple vitamin levels pending, augment diet/nutrition supplement.  Above documentation by previous hospitalist. To me she tells me that she had modified her diet significantly in an effort to lose the weight. She tells me that she was weighing over 420 pounds in 2018 and currently she is around 160 pounds. 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 ?steroid trial. Prn albuterol. OP f/u with pulm  CKD stage IIIa: Renal function stable.   Recent Labs  Lab 04/21/20 1556 04/22/20 0306 04/23/20 0357 04/24/20 0509 04/25/20 0049  BUN 26* 26* 25* 22 26*  CREATININE 1.27* 1.20* 1.16* 1.15* 1.07*   Hypomagnesemia  continue magnesium oxide.  Insomnia continue her Lunesta.  Diet Order            Diet regular Room service appropriate? Yes; Fluid consistency: Thin  Diet effective now               Patient's Body mass index is 27.14 kg/m.  DVT prophylaxis: ELIQUIS Code Status:   Code Status: Full Code  Family Communication: plan of care discussed with patient at bedside.  Status  is: Inpatient Remains inpatient appropriate because:Ongoing diagnostic testing needed not appropriate for outpatient work up, IV treatments appropriate due to intensity of illness or inability to take PO and Inpatient level of care appropriate due to severity of illness   Dispo: The patient is from: Home              Anticipated d/c is to: Home hopefully 1-2 days once off D10.              Patient currently is not medically stable to d/c.   Difficult to place patient No    Unresulted Labs (From admission, onward)          Start     Ordered   04/23/20 1645  Vitamin A  Once,   R        04/23/20 1644   04/23/20 1645  Vitamin C  Once,   R        04/23/20 1644   04/23/20 1645  Copper, serum  Once,   R        04/23/20 1644   04/23/20 1645  Zinc  Once,   R        04/23/20 1644   04/23/20 1644  Vitamin B1  Once,   R        04/23/20 1644   04/23/20 0959  Insulin antibodies, blood  Add-on,   AD        04/23/20 1000   04/23/20 0957  Proinsulin/insulin ratio  Add-on,   AD  04/23/20 1000   04/23/20 0957  Sulfonylurea Hypoglycemics Panel, blood  Add-on,   AD        04/23/20 1000          Medications reviewed:  Scheduled Meds: . apixaban  5 mg Oral BID  . calcitRIOL  0.25 mcg Oral q12n4p  . calcium carbonate  1 tablet Oral BID AC  . docusate sodium  100 mg Oral BID  . DULoxetine  60 mg Oral Daily  . feeding supplement  237 mL Oral TID BM  . folic acid  1 mg Oral Daily  . levothyroxine  137 mcg Oral Q0600  . magnesium oxide  400 mg Oral Daily  . methylPREDNISolone (SOLU-MEDROL) injection  125 mg Intravenous Once  . mirtazapine  7.5 mg Oral QHS  . montelukast  10 mg Oral QHS  . multivitamin with minerals  1 tablet Oral BID   Continuous Infusions: . dextrose 40 mL/hr at 04/25/20 0944    Consultants:see note  Procedures:see note  Antimicrobials: Anti-infectives (From admission, onward)   None     Objective: Vitals: Today's Vitals   04/24/20 2136 04/24/20 2229  04/25/20 0553 04/25/20 0727  BP: 93/68  116/82 110/77  Pulse: 60  60 60  Resp: 16  14 15   Temp: 97.6 F (36.4 C)  97.7 F (36.5 C) 98.1 F (36.7 C)  TempSrc: Oral  Oral Oral  SpO2: 100%  100% 100%  Weight:      PainSc: 0-No pain Asleep     No intake or output data in the 24 hours ending 04/25/20 1144 Filed Weights   04/22/20 1922  Weight: 78.6 kg   Weight change:   Intake/Output from previous day: No intake/output data recorded. Intake/Output this shift: No intake/output data recorded. Filed Weights   04/22/20 1922  Weight: 78.6 kg    Examination: General exam: Appears calm and comfortable  Respiratory system: Clear to auscultation. Respiratory effort normal. Cardiovascular system: S1 & S2 heard, RRR. No JVD, murmurs, rubs, gallops or clicks. No pedal edema. Gastrointestinal system: Abdomen is nondistended, soft and nontender. No organomegaly or masses felt. Normal bowel sounds heard. Central nervous system: Alert and oriented. No focal neurological deficits. Extremities: Symmetric 5 x 5 power. Skin: No rashes, lesions or ulcers.  Psychiatry: Judgement and insight appear normal. Mood & affect appropriate.   Data Reviewed: I have personally reviewed following labs and imaging studies CBC: Recent Labs  Lab 04/21/20 1556 04/22/20 0306 04/22/20 0402 04/23/20 0357 04/23/20 1150 04/23/20 1201 04/24/20 0509 04/25/20 0049  WBC 4.7 3.8*  --  3.8*  --   --  4.5 4.1  NEUTROABS 3.2 2.0  --   --   --   --   --   --   HGB 10.5* 9.0*   < > 9.5* 9.2* 9.2*  9.5* 8.9* 8.2*  HCT 33.8* 27.8*   < > 28.5* 27.0* 27.0*  28.0* 27.6* 24.7*  MCV 99.7 98.9  --  96.3  --   --  96.5 95.7  PLT 175 149*  --  152  --   --  145* 144*   < > = values in this interval not displayed.   Basic Metabolic Panel: Recent Labs  Lab 04/21/20 1556 04/22/20 0306 04/22/20 0402 04/23/20 0357 04/23/20 1150 04/23/20 1201 04/24/20 0509 04/25/20 0049  NA 142 141   < > 139 141 140  140 136 137  K  4.2 3.9   < > 3.8 3.9 4.1  4.1 3.7 4.0  CL 110 112*  --  109  --   --  108 109  CO2 23 22  --  22  --   --  21* 22  GLUCOSE 65* 67*  --  86  --   --  124* 75  BUN 26* 26*  --  25*  --   --  22 26*  CREATININE 1.27* 1.20*  --  1.16*  --   --  1.15* 1.07*  CALCIUM 6.9* 6.3*  --  6.7*  --   --  6.9* 7.0*  MG  --  1.6*  --   --   --   --   --   --    < > = values in this interval not displayed.   GFR: Estimated Creatinine Clearance: 55.1 mL/min (A) (by C-G formula based on SCr of 1.07 mg/dL (H)). Liver Function Tests: Recent Labs  Lab 04/22/20 0306 04/23/20 0357 04/24/20 0509 04/25/20 0049  AST 21 21 22 21   ALT 24 22 25 24   ALKPHOS 56 51 61 54  BILITOT 0.8 0.7 0.6 0.5  PROT 4.8* 4.4* 4.5* 4.2*  ALBUMIN 2.0* 1.9* 1.9* 1.8*   No results for input(s): LIPASE, AMYLASE in the last 168 hours. No results for input(s): AMMONIA in the last 168 hours. Coagulation Profile: No results for input(s): INR, PROTIME in the last 168 hours. Cardiac Enzymes: No results for input(s): CKTOTAL, CKMB, CKMBINDEX, TROPONINI in the last 168 hours. BNP (last 3 results) Recent Labs    06/13/19 1429 04/02/20 0941  PROBNP 251.0* 186.0*   HbA1C: Recent Labs    04/25/20 0049  HGBA1C 4.0*   CBG: Recent Labs  Lab 04/25/20 0213 04/25/20 0421 04/25/20 0643 04/25/20 0833 04/25/20 1042  GLUCAP 73 74 70 58* 72   Lipid Profile: No results for input(s): CHOL, HDL, LDLCALC, TRIG, CHOLHDL, LDLDIRECT in the last 72 hours. Thyroid Function Tests: No results for input(s): TSH, T4TOTAL, FREET4, T3FREE, THYROIDAB in the last 72 hours. Anemia Panel: No results for input(s): VITAMINB12, FOLATE, FERRITIN, TIBC, IRON, RETICCTPCT in the last 72 hours. Sepsis Labs: No results for input(s): PROCALCITON, LATICACIDVEN in the last 168 hours.  Recent Results (from the past 240 hour(s))  SARS CORONAVIRUS 2 (TAT 6-24 HRS) Nasopharyngeal Nasopharyngeal Swab     Status: None   Collection Time: 04/22/20  2:00 AM    Specimen: Nasopharyngeal Swab  Result Value Ref Range Status   SARS Coronavirus 2 NEGATIVE NEGATIVE Final    Comment: (NOTE) SARS-CoV-2 target nucleic acids are NOT DETECTED.  The SARS-CoV-2 RNA is generally detectable in upper and lower respiratory specimens during the acute phase of infection. Negative results do not preclude SARS-CoV-2 infection, do not rule out co-infections with other pathogens, and should not be used as the sole basis for treatment or other patient management decisions. Negative results must be combined with clinical observations, patient history, and epidemiological information. The expected result is Negative.  Fact Sheet for Patients: SugarRoll.be  Fact Sheet for Healthcare Providers: https://www.woods-mathews.com/  This test is not yet approved or cleared by the [place] FDA and  has been authorized for detection and/or diagnosis of SARS-CoV-2 by FDA under an Emergency Use Authorization (EUA). This EUA will remain  in effect (meaning this test can be used) for the duration of the COVID-19 declaration under Se ction 564(b)(1) of the Act, 21 U.S.C. section 360bbb-3(b)(1), unless the authorization is terminated or revoked sooner.  Performed at [facility], [place],  [place] [address]      Radiology Studies: No results found.   LOS: 3 days   Darliss Cheney, MD [facility]  04/25/2020, 11:43 AM

## 2020-04-26 ENCOUNTER — Encounter (HOSPITAL_COMMUNITY): Payer: Self-pay | Admitting: Internal Medicine

## 2020-04-26 DIAGNOSIS — E43 Unspecified severe protein-calorie malnutrition: Secondary | ICD-10-CM | POA: Insufficient documentation

## 2020-04-26 DIAGNOSIS — R0602 Shortness of breath: Secondary | ICD-10-CM | POA: Diagnosis not present

## 2020-04-26 LAB — GLUCOSE, CAPILLARY
Glucose-Capillary: 145 mg/dL — ABNORMAL HIGH (ref 70–99)
Glucose-Capillary: 43 mg/dL — CL (ref 70–99)
Glucose-Capillary: 43 mg/dL — CL (ref 70–99)
Glucose-Capillary: 55 mg/dL — ABNORMAL LOW (ref 70–99)
Glucose-Capillary: 58 mg/dL — ABNORMAL LOW (ref 70–99)
Glucose-Capillary: 61 mg/dL — ABNORMAL LOW (ref 70–99)
Glucose-Capillary: 62 mg/dL — ABNORMAL LOW (ref 70–99)
Glucose-Capillary: 64 mg/dL — ABNORMAL LOW (ref 70–99)
Glucose-Capillary: 68 mg/dL — ABNORMAL LOW (ref 70–99)
Glucose-Capillary: 71 mg/dL (ref 70–99)
Glucose-Capillary: 85 mg/dL (ref 70–99)
Glucose-Capillary: 85 mg/dL (ref 70–99)
Glucose-Capillary: 92 mg/dL (ref 70–99)

## 2020-04-26 LAB — BASIC METABOLIC PANEL
Anion gap: 8 (ref 5–15)
BUN: 25 mg/dL — ABNORMAL HIGH (ref 8–23)
CO2: 22 mmol/L (ref 22–32)
Calcium: 7.5 mg/dL — ABNORMAL LOW (ref 8.9–10.3)
Chloride: 109 mmol/L (ref 98–111)
Creatinine, Ser: 1.1 mg/dL — ABNORMAL HIGH (ref 0.44–1.00)
GFR, Estimated: 55 mL/min — ABNORMAL LOW (ref >60)
Glucose, Bld: 68 mg/dL — ABNORMAL LOW (ref 70–99)
Potassium: 4.5 mmol/L (ref 3.5–5.1)
Sodium: 139 mmol/L (ref 135–145)

## 2020-04-26 MED ORDER — ZINC SULFATE 220 (50 ZN) MG PO CAPS
220.0000 mg | ORAL_CAPSULE | Freq: Every day | ORAL | Status: DC
  Administered 2020-04-26 – 2020-05-04 (×9): 220 mg via ORAL
  Filled 2020-04-26 (×9): qty 1

## 2020-04-26 MED ORDER — CALCIUM CARBONATE ANTACID 500 MG PO CHEW
1.0000 | CHEWABLE_TABLET | Freq: Three times a day (TID) | ORAL | Status: DC
  Administered 2020-04-26 – 2020-04-27 (×3): 200 mg via ORAL
  Filled 2020-04-26 (×3): qty 1

## 2020-04-26 MED ORDER — DEXTROSE 5 % IV SOLN
2.0000 mg | Freq: Every day | INTRAVENOUS | Status: AC
  Administered 2020-04-26 – 2020-04-28 (×3): 2 mg via INTRAVENOUS
  Filled 2020-04-26 (×3): qty 5

## 2020-04-26 NOTE — Plan of Care (Signed)
  Problem: Education: Goal: Knowledge of General Education information will improve Description: Including pain rating scale, medication(s)/side effects and non-pharmacologic comfort measures Outcome: Progressing   Problem: Tissue Perfusion: Goal: Adequacy of tissue perfusion will improve Outcome: Progressing

## 2020-04-26 NOTE — Progress Notes (Addendum)
Nutrition Follow Up  DOCUMENTATION CODES:   Severe malnutrition in context of chronic illness  INTERVENTION:   Replete copper 2 mg daily   Replete zinc sulfate 220 mg daily   (taken at different times in the day)    Ensure Enlive po TID, each supplement provides 350 kcal and 20 grams of protein  MVI with minerals BID  Increase calcium carbonate 500 mg to TID   (MVI and calcium need to be taken at least two hours apart)  NUTRITION DIAGNOSIS:   Severe Malnutrition related to chronic illness (hx RYGB, CHF) as evidenced by severe fat depletion,severe muscle depletion,percent weight loss,energy intake < or equal to 75% for > or equal to 1 month.  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 Presents this admission with CHF exacerbation.   Appetite increased this admission. Able to consume 75% of breakfast this am. She has not been consuming Ensure as she was trying to avoid sugar. RD discussed the importance of increased kcal and protein intake for weight maintainence and lean muscle mass retention.   Patient endorses decreased appetite over two years after her L knee surgery. States during this time she consumed 2-3 smaller meals daily that consisted of B- hot cereal, egg, toast, fruit L- bowl of vegetable soup D- pasta, seafood. She did not use protein supplementation. Portions were relatively small and patient reports feeling full after a few bites. Her GI doctor prescribed her marinol last week and she has noticed a slight increase in appetite.   Since her  conversion to RYGB in 1992 patient has not used vitamin/mineral supplementation. It is recommended after RYGB to continue life long MVI and calcium supplementation. Noted multiple vitamin/mineral deficiencies this admission and in past. After discovery of these deficiencies in the outpatient setting patient was placed on 50000 IU Vitamin D, calcitriol, iron infusion, 400 mg magnesium, and 1 mg folic acid.  NFPE shows pale brittle nails indicative of anemia and brownish gray spots in bilateral conjunctiva concerning for Bitot's spots. Patient also complains of difficulty seeing at night. Vitamin A lab pending. RD providing appropriate supplementation this admission but patient will need to continue bariatric MVI BID and calcium carbonate 500 mg TID at home.   Correction:  Patient started her weigh loss journey at 410 lb in 1992. She lost down to 270 after her surgery and gained back up to 410 lb within 20 years time. In 2018 patient weighed 410 lb and intentionally lost down to 190 lb through diet/habit changes. In 2021 patient weighed 190 lb and unintentionally lost 20 lb to her current weight of ~170 lb.   Noted hypoglycemic episodes between meals. C-peptide is high. A1C is low. Discussed with patient to consume small more frequent meals to aid in blood glucose control.   Vitamin/MIneral Profile:  Thiamine B1: pending  Vitamin B12: 639 (wdl) Vitamin A: pending  Vitamin D: 74 (wdl) Vitamin C: pending  Copper: 44 (L) Selenium: pending Zinc: 45 (low normal)  Drips: D10 @ 75 ml/hr  Medications: calcitriol BID, os-cal BID, colace, folic acid, mag-ox, solumedrol, MVI BID Labs: CBG 43-92 A1C  4 (L) C-peptide 4.7 (H)   Diet Order:   Diet Order            Diet regular Room service appropriate? Yes; Fluid consistency: Thin  Diet effective now                 EDUCATION NEEDS:   Not appropriate for education at this time  Skin:  Skin Assessment: Reviewed RN Assessment  Last BM:   3/4  Height:   Ht Readings from Last 1 Encounters:  04/19/20 5\' 7"  (1.702 m)    Weight:   Wt Readings from Last 1 Encounters:  04/22/20 78.6 kg    BMI:  Body mass index is 27.14 kg/m.  Estimated Nutritional Needs:   Kcal:  2000-2200 kcal  Protein:  100-120 grams  Fluid:  >/= 2 L/day  Mariana Single RD, LDN Clinical Nutrition Pager listed in [place]

## 2020-04-26 NOTE — Consult Note (Signed)
NAME:  Jillian Eaton, MRN:  [id number], DOB:  May 27, 1950, LOS: 4 ADMISSION DATE:  04/21/2020, CONSULTATION DATE:  04/23/20 REFERRING MD:  Lupita Leash - TRH, CHIEF COMPLAINT:  Hypoxia and Dyspnea   Brief History:  70 yo F 3-4 wk hx of SOB. Reportedly hypoxic though appears to be 98-100% on RA this admission. No explanation for SOB on R/LHC.  PCCM consult for SOB.   History of Present Illness:  70 yo F PMH gastric bypass, mild asthma, hypothyroidism, systolic and diastolic CHF, Afib on eliquis, s/p pacemaker, seizure disorder, CKD IIIa, HTN who was admitted to hospitalist service at [facility] 3/3 for SOB and reported hypoxia SpO2 in the 70s, though looks like this has grossly improved. SOB hs been present x 3-4 weeks. Went for [facility] 3/4 and there was no cardiac etiology to explain SOB and there is not evidence of pulmonary hypertension. Patient has also been hypoglycemic 3/4   In conversation with patient, states these "breathing spells" happen intermittently and last for 3-4 months. She describes them as choking, but then revises her description to say chest pain, revised to say too weak to breath. Problem is longstanding, I have reviewed [facility] office notes (follows with Tammy Parrett, Dr. Halford Chessman) as well as cardiology notes. Patient was recently prescribed home O2 (states from her PCP) but she denies needing it and thus does not use. Does not use pulm meds for these "breathing spells" as she states "it isn't a breathing problem." She states that when these spells happen, she cannot walk and feels completely debilitated.  PCCM consult 3/4 for evaluation of hypoxia and SOB, as well as hypoglycemia.   Past Medical History:  Fibromyalgia HTN Hypothyroidism Gastric bypass Systolic and diastolic HF (with recovered LVEF) Afib OSA  CKD IIIa Mild asthma   Significant Hospital Events:  3/3 admitted to [facility] for SOB, hypoxia, though noted in [facility] admit note pt not hypoxic at rest. 3/4 R/LHC without cardiac  explanation for SOB. No evidence of pulm HTN. PCCM consult.   Consults:  Cards PCCM  Procedures:  3/4 R/LHC >>>  Significant Diagnostic Tests:  3/2 CXR> clear lungs 3/4 ECHO> LVEF 55-60%, LA severely dilated. RA severely dilated. Moderate tricuspid valve regurg.   Micro Data:    Antimicrobials:    Interim History / Subjective:  Went for [facility]   Is 100% on RA, she is speaking in complete sentences without difficulty.   Patient states she feels fine today, and feels like she could walk down the hall if she were permitted.   Objective   Blood pressure 94/68, pulse 60, temperature 98.2 F (36.8 C), resp. rate 16, weight 78.6 kg, SpO2 100 %.       No intake or output data in the 24 hours ending 04/26/20 1413 Filed Weights   04/22/20 1922  Weight: 78.6 kg    Examination: General: Chronically ill appearing older adult.  HENT: NCAT pink mm. Trachea midline Lungs: CTA bilaterally. Symmetrical chest expansion, no accessory use on RA  Cardiovascular: Cap refill brisk. s1s2 Abdomen: Soft round ndnt  Extremities: BLE edema.  Neuro: AAOx3 following commands GU: defer  Resolved Hospital Problem list     Assessment & Plan:   Dyspnea, acutely improved  Hypoxia, acutely improved HFpEF Chronic A.Fib/Complete Heart block s/p AV nodal ablation and pacemaker placement.   - Her respiratory status is improved and she is not requiring supplemental oxygen - Unclear etiology of what is driving these dyspnea spells - She will continue to follow up  outpatient with Dr. Halford Chessman.  -CXR from admission is clear. SpO2 is 100% on RA.  -Cardiac workup this admission has not revealed cardiac etiology of SOB. Not c/w CHF exacerbation, and no findings of pulmonary hypertension.  - Serum bicarb remains within normal range - CTPE study two weeks ago without parenchymal lung disease  - Possible vocal cord dysfunction, may require ENT follow up in the future  Pulmonary will sign off. Please call  in the future if needed.  Labs   CBC: Recent Labs  Lab 04/21/20 1556 04/22/20 0306 04/22/20 0402 04/23/20 0357 04/23/20 1150 04/23/20 1201 04/24/20 0509 04/25/20 0049  WBC 4.7 3.8*  --  3.8*  --   --  4.5 4.1  NEUTROABS 3.2 2.0  --   --   --   --   --   --   HGB 10.5* 9.0*   < > 9.5* 9.2* 9.2*  9.5* 8.9* 8.2*  HCT 33.8* 27.8*   < > 28.5* 27.0* 27.0*  28.0* 27.6* 24.7*  MCV 99.7 98.9  --  96.3  --   --  96.5 95.7  PLT 175 149*  --  152  --   --  145* 144*   < > = values in this interval not displayed.    Basic Metabolic Panel: Recent Labs  Lab 04/22/20 0306 04/22/20 0402 04/23/20 0357 04/23/20 1150 04/23/20 1201 04/24/20 0509 04/25/20 0049 04/26/20 0207  NA 141   < > 139 141 140  140 136 137 139  K 3.9   < > 3.8 3.9 4.1  4.1 3.7 4.0 4.5  CL 112*  --  109  --   --  108 109 109  CO2 22  --  22  --   --  21* 22 22  GLUCOSE 67*  --  86  --   --  124* 75 68*  BUN 26*  --  25*  --   --  22 26* 25*  CREATININE 1.20*  --  1.16*  --   --  1.15* 1.07* 1.10*  CALCIUM 6.3*  --  6.7*  --   --  6.9* 7.0* 7.5*  MG 1.6*  --   --   --   --   --   --   --    < > = values in this interval not displayed.   GFR: Estimated Creatinine Clearance: 53.6 mL/min (A) (by C-G formula based on SCr of 1.1 mg/dL (H)). Recent Labs  Lab 04/22/20 0306 04/23/20 0357 04/24/20 0509 04/25/20 0049  WBC 3.8* 3.8* 4.5 4.1    Liver Function Tests: Recent Labs  Lab 04/22/20 0306 04/23/20 0357 04/24/20 0509 04/25/20 0049  AST 21 21 22 21   ALT 24 22 25 24   ALKPHOS 56 51 61 54  BILITOT 0.8 0.7 0.6 0.5  PROT 4.8* 4.4* 4.5* 4.2*  ALBUMIN 2.0* 1.9* 1.9* 1.8*   No results for input(s): LIPASE, AMYLASE in the last 168 hours. No results for input(s): AMMONIA in the last 168 hours.  ABG    Component Value Date/Time   PHART 7.358 04/23/2020 1150   PCO2ART 39.8 04/23/2020 1150   PO2ART 85 04/23/2020 1150   HCO3 23.9 04/23/2020 1201   HCO3 23.7 04/23/2020 1201   TCO2 25 04/23/2020 1201    TCO2 25 04/23/2020 1201   ACIDBASEDEF 2.0 04/23/2020 1201   ACIDBASEDEF 2.0 04/23/2020 1201   O2SAT 62.0 04/23/2020 1201   O2SAT 62.0 04/23/2020 1201     Coagulation Profile:  No results for input(s): INR, PROTIME in the last 168 hours.  Cardiac Enzymes: No results for input(s): CKTOTAL, CKMB, CKMBINDEX, TROPONINI in the last 168 hours.  HbA1C: Hgb A1c MFr Bld  Date/Time Value Ref Range Status  04/25/2020 12:49 AM 4.0 (L) 4.8 - 5.6 % Final    Comment:    (NOTE) Pre diabetes:          5.7%-6.4%  Diabetes:              >6.4%  Glycemic control for   <7.0% adults with diabetes   06/16/2019 05:55 AM 5.0 4.8 - 5.6 % Final    Comment:    (NOTE) Pre diabetes:          5.7%-6.4% Diabetes:              >6.4% Glycemic control for   <7.0% adults with diabetes     CBG: Recent Labs  Lab 04/26/20 0822 04/26/20 0930 04/26/20 1024 04/26/20 1229 04/26/20 [place]* 71 92 43* 85    Freda Jackson, MD [facility] Office: [phone]

## 2020-04-26 NOTE — Progress Notes (Signed)
PROGRESS NOTE    Jillian Eaton  WUJ:[id number] DOB: 05/25/1950 DOA: 04/21/2020 PCP: [name], MD   Chief Complaint  Patient presents with  . 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  She reports seeing PCP and cardiologist recently, underwent radiographic work-up chest x-ray CT angio of the chest- neg for PE and received course of diuretics, but did not resolve her symptoms. She reports she was placed on oxygen by her PCP few days ago  In the ED chest x-ray no evidence of acute abnormalities BNP minimally elevated 167 troponin at 4. She was found to have leg edema.Patient complains of being very weak, tired, short of breath at rest and with activity, chest fullness. Patient was admitted cardiology was consulted. Also noted to be hypoglycemic reports she has had hypoglycemic for a week or so, placed on D10 Patient underwent right heart cath that showed no evidence of pulmonary hypertension and no increased left-sided filling pressure-shortness of breath hypoxia not explained by congestive heart failure so advised PCCM consulted.   Subjective: Seen and examined.  No complaints.  She was hypoglycemic with blood sugar of 40s but she did not have any symptoms.  Assessment & Plan:  Acute chest pain with shortness of breath/ hypoxic respiratory failure, pulmonary hypertension in imaging/HFpEF: Having shortness of breath fatigue dyspnea with exertion  for few weeks.  Patient has an extensive work-up evaluation on outpatient by her cardiologist Gooding CT PE study 2/19 with no PE, but showed evidence of pulmonary hypertension and small bilateral pleural effusion. TSH normal, minimal leg edema present, cardiology has been consulted, 2D echo done 3/3 with EF 55 to 60%, mild concentric LVH left ventricular diastolic parameters indeterminate mildly enlarged RV. her PFT from 03/01/2019 showed obstructive lung disease s/p right heart cath 04/23/20-that showed no evidence of pulmonary hypertension and no increased left-sided filling pressure-shortness of breath hypoxia not explained by congestive heart failure (euvolemic.  She was seen by pulmonary and could be due to asthma/possible vocal cord dysfunction.  Currently not needing oxygen pulmonary has advised outpatient follow-up and also ENT evaluation as outpatient. She is much better. Not hypoxic. No shortness of breath.  Chronic A. Fib Complete heart block Status post AV nodal ablation and [employer] pacemaker on 4/21 Continue on Eliquis.  Not on AV nodal blocker  Hypoglycemia: Patient reports having recent hypoglycemia in outpatient blood work as outpatient.  Has been hypoglycemic here needing D10 infusion. ?  Etiology likely in the setting of gastric bypass,has not had great appetite/and seems particular about food.  Dietitian consulted.cosyntropin test NEGATIVE-  discussed with her endocrinologist Dr. Cruzita Lederer she reports the new Cortisol cut off is 14 and cosyntropin test is negative.  Discussed w/ Endocrine- we ordered C peptide BHOB, SFU level, insulin, proinsulin, insulin antibody < monitor CBG in an effort to wean her down from dextrose, I reduced her D10 frequency to 40 cc/h and her blood sugar has been running in 40s although she remains asymptomatic.  We will bump it back to 75 cc/h.  I cannot find out any other source of hypoglycemia.  Her C-peptide was  only slightly elevated.  Insulin/proinsulin still  pending. Recent Labs  Lab 04/26/20 0512 04/26/20 0822 04/26/20 0930 04/26/20 1024 04/26/20 1229  GLUCAP 61* 43* 71 92 43*   Hypotension: Hypotension in 65/56 on presentation, needing IV fluid bolus and has resolved since then.  Monitor.  Unintentional weight loss for past several months:TSH stable, had mammogram in December/21 normal.She is to undergo upper and lower endoscopy in several days as an outpatient with her gastroenterologist to rule out  GI malignancy. She needs to see her GI- please provide instruction at discharge.  Dietitian consulted multiple vitamin levels pending, augment diet/nutrition supplement.  Above documentation by previous hospitalist. To me she tells me that she had modified her diet significantly in an effort to lose the weight. She tells me that she was weighing over 420 pounds in 2018 and currently she is around 160 pounds. 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 ?steroid trial. Prn albuterol. OP f/u with pulm  Multiple electrolyte abnormalities including copper, zinc which are all being replaced by nutrition.  Appreciate their help.  CKD stage IIIa: Renal function stable.   Recent Labs  Lab 04/22/20 0306 04/23/20 0357 04/24/20 0509 04/25/20 0049 04/26/20 0207  BUN 26* 25* 22 26* 25*  CREATININE 1.20* 1.16* 1.15* 1.07* 1.10*   Hypomagnesemia  continue magnesium oxide.  Insomnia continue her Lunesta.  Diet Order            Diet regular Room service appropriate? Yes; Fluid consistency: Thin  Diet effective now               Patient's Body mass index is 27.14 kg/m.  DVT prophylaxis: ELIQUIS Code Status:   Code Status: Full Code  Family Communication: plan of  care discussed with patient at bedside.  Status is: Inpatient Remains inpatient appropriate because:Ongoing diagnostic testing needed not appropriate for outpatient work up, IV treatments appropriate due to intensity of illness or inability to take PO and Inpatient level of care appropriate due to severity of illness   Dispo: The patient is from: Home              Anticipated d/c is to: Home hopefully 1-2 days once off D10.              Patient currently is not medically stable to d/c.   Difficult to place patient No    Unresulted Labs (From admission, onward)          Start     Ordered   04/26/20 1339  Miscellaneous [facility] test (send-out)  Once,   R       Comments: Selenium [id number]   Question:  Test name / description:  Answer:  Selenium [id number]   04/26/20 1339   04/26/20 1337  Insulin, random  Once,   R       Question:  Specimen collection method  Answer:  Lab=Lab collect   04/26/20 1336   04/23/20 1645  Vitamin A  Once,   R        04/23/20 1644   04/23/20 1645  Vitamin C  Once,   R        04/23/20 1644   04/23/20 1644  Vitamin B1  Once,   R        04/23/20 1644  04/23/20 0959  Insulin antibodies, blood  Add-on,   AD        04/23/20 1000   04/23/20 0957  Proinsulin/insulin ratio  Add-on,   AD        04/23/20 1000   04/23/20 0957  Sulfonylurea Hypoglycemics Panel, blood  Add-on,   AD        04/23/20 1000          Medications reviewed:  Scheduled Meds: . apixaban  5 mg Oral BID  . calcitRIOL  0.25 mcg Oral q12n4p  . calcium carbonate  1 tablet Oral TID  . docusate sodium  100 mg Oral BID  . DULoxetine  60 mg Oral Daily  . feeding supplement  237 mL Oral TID BM  . folic acid  1 mg Oral Daily  . levothyroxine  137 mcg Oral Q0600  . magnesium oxide  400 mg Oral Daily  . methylPREDNISolone (SOLU-MEDROL) injection  125 mg Intravenous Once  . mirtazapine  7.5 mg Oral QHS  . montelukast  10 mg Oral QHS  . multivitamin with minerals  1 tablet Oral BID  . zinc sulfate   220 mg Oral Daily   Continuous Infusions: . dextrose 75 mL/hr at 04/26/20 1253    Consultants:see note  Procedures:see note  Antimicrobials: Anti-infectives (From admission, onward)   None     Objective: Vitals: Today's Vitals   04/26/20 0516 04/26/20 0726 04/26/20 0910 04/26/20 1212  BP: 124/74     Pulse: 62     Resp: 20     Temp: 98.3 F (36.8 C)     TempSrc: Oral     SpO2: 100%     Weight:      PainSc:  Asleep 0-No pain 0-No pain   No intake or output data in the 24 hours ending 04/26/20 1347 Filed Weights   04/22/20 1922  Weight: 78.6 kg   Weight change:   Intake/Output from previous day: No intake/output data recorded. Intake/Output this shift: No intake/output data recorded. Filed Weights   04/22/20 1922  Weight: 78.6 kg    Examination: General exam: Appears calm and comfortable  Respiratory system: Clear to auscultation. Respiratory effort normal. Cardiovascular system: S1 & S2 heard, RRR. No JVD, murmurs, rubs, gallops or clicks. No pedal edema. Gastrointestinal system: Abdomen is nondistended, soft and nontender. No organomegaly or masses felt. Normal bowel sounds heard. Central nervous system: Alert and oriented. No focal neurological deficits. Extremities: Symmetric 5 x 5 power. Skin: No rashes, lesions or ulcers.  Psychiatry: Judgement and insight appear normal. Mood & affect appropriate.    Data Reviewed: I have personally reviewed following labs and imaging studies CBC: Recent Labs  Lab 04/21/20 1556 04/22/20 0306 04/22/20 0402 04/23/20 0357 04/23/20 1150 04/23/20 1201 04/24/20 0509 04/25/20 0049  WBC 4.7 3.8*  --  3.8*  --   --  4.5 4.1  NEUTROABS 3.2 2.0  --   --   --   --   --   --   HGB 10.5* 9.0*   < > 9.5* 9.2* 9.2*  9.5* 8.9* 8.2*  HCT 33.8* 27.8*   < > 28.5* 27.0* 27.0*  28.0* 27.6* 24.7*  MCV 99.7 98.9  --  96.3  --   --  96.5 95.7  PLT 175 149*  --  152  --   --  145* 144*   < > = values in this interval not displayed.    Basic Metabolic Panel: Recent Labs  Lab 04/22/20 0306 04/22/20  4268 04/23/20 0357 04/23/20 1150 04/23/20 1201 04/24/20 0509 04/25/20 0049 04/26/20 0207  NA 141   < > 139 141 140  140 136 137 139  K 3.9   < > 3.8 3.9 4.1  4.1 3.7 4.0 4.5  CL 112*  --  109  --   --  108 109 109  CO2 22  --  22  --   --  21* 22 22  GLUCOSE 67*  --  86  --   --  124* 75 68*  BUN 26*  --  25*  --   --  22 26* 25*  CREATININE 1.20*  --  1.16*  --   --  1.15* 1.07* 1.10*  CALCIUM 6.3*  --  6.7*  --   --  6.9* 7.0* 7.5*  MG 1.6*  --   --   --   --   --   --   --    < > = values in this interval not displayed.   GFR: Estimated Creatinine Clearance: 53.6 mL/min (A) (by C-G formula based on SCr of 1.1 mg/dL (H)). Liver Function Tests: Recent Labs  Lab 04/22/20 0306 04/23/20 0357 04/24/20 0509 04/25/20 0049  AST 21 21 22 21   ALT 24 22 25 24   ALKPHOS 56 51 61 54  BILITOT 0.8 0.7 0.6 0.5  PROT 4.8* 4.4* 4.5* 4.2*  ALBUMIN 2.0* 1.9* 1.9* 1.8*   No results for input(s): LIPASE, AMYLASE in the last 168 hours. No results for input(s): AMMONIA in the last 168 hours. Coagulation Profile: No results for input(s): INR, PROTIME in the last 168 hours. Cardiac Enzymes: No results for input(s): CKTOTAL, CKMB, CKMBINDEX, TROPONINI in the last 168 hours. BNP (last 3 results) Recent Labs    06/13/19 1429 04/02/20 0941  PROBNP 251.0* 186.0*   HbA1C: Recent Labs    04/25/20 0049  HGBA1C 4.0*   CBG: Recent Labs  Lab 04/26/20 0512 04/26/20 0822 04/26/20 0930 04/26/20 1024 04/26/20 1229  GLUCAP 61* 43* 71 92 43*   Lipid Profile: No results for input(s): CHOL, HDL, LDLCALC, TRIG, CHOLHDL, LDLDIRECT in the last 72 hours. Thyroid Function Tests: No results for input(s): TSH, T4TOTAL, FREET4, T3FREE, THYROIDAB in the last 72 hours. Anemia Panel: No results for input(s): VITAMINB12, FOLATE, FERRITIN, TIBC, IRON, RETICCTPCT in the last 72 hours. Sepsis Labs: No results for input(s):  PROCALCITON, LATICACIDVEN in the last 168 hours.  Recent Results (from the past 240 hour(s))  SARS CORONAVIRUS 2 (TAT 6-24 HRS) Nasopharyngeal Nasopharyngeal Swab     Status: None   Collection Time: 04/22/20  2:00 AM   Specimen: Nasopharyngeal Swab  Result Value Ref Range Status   SARS Coronavirus 2 NEGATIVE NEGATIVE Final    Comment: (NOTE) SARS-CoV-2 target nucleic acids are NOT DETECTED.  The SARS-CoV-2 RNA is generally detectable in upper and lower respiratory specimens during the acute phase of infection. Negative results do not preclude SARS-CoV-2 infection, do not rule out co-infections with other pathogens, and should not be used as the sole basis for treatment or other patient management decisions. Negative results must be combined with clinical observations, patient history, and epidemiological information. The expected result is Negative.  Fact Sheet for Patients: SugarRoll.be  Fact Sheet for Healthcare Providers: https://www.woods-mathews.com/  This test is not yet approved or cleared by the [place] FDA and  has been authorized for detection and/or diagnosis of SARS-CoV-2 by FDA under an Emergency Use Authorization (EUA). This EUA will remain  in effect (  meaning this test can be used) for the duration of the COVID-19 declaration under Se ction 564(b)(1) of the Act, 21 U.S.C. section 360bbb-3(b)(1), unless the authorization is terminated or revoked sooner.  Performed at [facility], [place] [address], [place], [place] [address]      Radiology Studies: No results found.   LOS: 4 days   Darliss Cheney, MD [facility]  04/26/2020, 1:47 PM

## 2020-04-27 ENCOUNTER — Telehealth: Payer: Self-pay | Admitting: Adult Health

## 2020-04-27 DIAGNOSIS — R0602 Shortness of breath: Secondary | ICD-10-CM | POA: Diagnosis not present

## 2020-04-27 LAB — GLUCOSE, CAPILLARY
Glucose-Capillary: 58 mg/dL — ABNORMAL LOW (ref 70–99)
Glucose-Capillary: 37 mg/dL — CL (ref 70–99)
Glucose-Capillary: 58 mg/dL — ABNORMAL LOW (ref 70–99)
Glucose-Capillary: 59 mg/dL — ABNORMAL LOW (ref 70–99)
Glucose-Capillary: 63 mg/dL — ABNORMAL LOW (ref 70–99)

## 2020-04-27 LAB — POCT CBG MONITORING
Blood Glucose, Fingerstick: 50
Blood Glucose, Fingerstick: 76
Blood Glucose, Fingerstick: 81

## 2020-04-27 LAB — VITAMIN C: Vitamin C: 0.2 mg/dL — ABNORMAL LOW (ref 0.4–2.0)

## 2020-04-27 LAB — GLUCOSE, POCT (MANUAL RESULT ENTRY)
POC Glucose: 95 mg/dl (ref 70–99)
POC Glucose: 92 mg/dl (ref 70–99)
POC Glucose: 81 mg/dl (ref 70–99)

## 2020-04-27 LAB — INSULIN, RANDOM: Insulin: 2.2 u[IU]/mL — ABNORMAL LOW (ref 2.6–24.9)

## 2020-04-27 MED ORDER — CALCIUM CARBONATE ANTACID 500 MG PO CHEW
1.0000 | CHEWABLE_TABLET | Freq: Three times a day (TID) | ORAL | Status: DC
  Administered 2020-04-27 – 2020-04-29 (×5): 200 mg via ORAL
  Filled 2020-04-27 (×5): qty 1

## 2020-04-27 MED ORDER — ADULT MULTIVITAMIN W/MINERALS CH
1.0000 | ORAL_TABLET | Freq: Two times a day (BID) | ORAL | Status: DC
  Administered 2020-04-27 – 2020-04-29 (×4): 1 via ORAL
  Filled 2020-04-27 (×4): qty 1

## 2020-04-27 NOTE — Care Management Important Message (Signed)
Important Message  Patient Details  Name: Jillian Eaton MRN: [id number] Date of Birth: 1950-09-02   Medicare Important Message Given:  Yes     Orbie Pyo 04/27/2020, 3:23 PM

## 2020-04-27 NOTE — Progress Notes (Signed)
BS 76 per libre glucose monitor

## 2020-04-27 NOTE — Telephone Encounter (Signed)
See phone note from 04/24/2020. Will close encounter.

## 2020-04-27 NOTE — Plan of Care (Signed)
  Problem: Nutritional: Goal: Maintenance of adequate nutrition will improve Outcome: Progressing   Problem: Skin Integrity: Goal: Risk for impaired skin integrity will decrease Outcome: Progressing   Problem: Tissue Perfusion: Goal: Adequacy of tissue perfusion will improve Outcome: Progressing   

## 2020-04-27 NOTE — Telephone Encounter (Signed)
Left message for patient to call back  

## 2020-04-27 NOTE — Progress Notes (Signed)
PROGRESS NOTE    Jillian Eaton  FTD:[id number] DOB: 11-24-1950 DOA: 04/21/2020 PCP: [name], MD   Chief Complaint  Patient presents with  . 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  She reports seeing PCP and cardiologist recently, underwent radiographic work-up chest x-ray CT angio of the chest- neg for PE and received course of diuretics, but did not resolve her symptoms. She reports she was placed on oxygen by her PCP few days ago  In the ED chest x-ray no evidence of acute abnormalities BNP minimally elevated 167 troponin at 4. She was found to have leg edema.Patient complains of being very weak, tired, short of breath at rest and with activity, chest fullness. Patient was admitted cardiology was consulted. Also noted to be hypoglycemic reports she has had hypoglycemic for a week or so, placed on D10 Patient underwent right heart cath that showed no evidence of pulmonary hypertension and no increased left-sided filling pressure-shortness of breath hypoxia not explained by congestive heart failure so advised PCCM consulted.   Subjective: Seen and examined.  No complaints.  She tells me that she did not have any complaint when her blood sugar was in 30s this morning.  Assessment & Plan:  Acute chest pain with shortness of breath/ hypoxic respiratory failure, pulmonary hypertension in imaging/HFpEF: Having shortness of breath fatigue dyspnea  with exertion for few weeks.  Patient has an extensive work-up evaluation on outpatient by her cardiologist Gooding CT PE study 2/19 with no PE, but showed evidence of pulmonary hypertension and small bilateral pleural effusion. TSH normal, minimal leg edema present, cardiology has been consulted, 2D echo done 3/3 with EF 55 to 60%, mild concentric LVH left ventricular diastolic parameters indeterminate mildly enlarged RV. her PFT from 03/01/2019 showed obstructive lung disease s/p right heart cath 04/23/20-that showed no evidence of pulmonary hypertension and no increased left-sided filling pressure-shortness of breath hypoxia not explained by congestive heart failure (euvolemic.  She was seen by pulmonary and could be due to asthma/possible vocal cord dysfunction.  Currently not needing oxygen pulmonary has advised outpatient follow-up and also ENT evaluation as outpatient. She is much better. Not hypoxic. No shortness of breath.  Chronic A. Fib Complete heart block Status post AV nodal ablation and [employer] pacemaker on 4/21 Continue on Eliquis.  Not on AV nodal blocker  Hypoglycemia: Patient reports having recent hypoglycemia in outpatient blood work as outpatient.  Has been hypoglycemic here needing D10 infusion. ?  Etiology likely in the setting of gastric bypass,has not had great appetite/and seems particular about food.  Dietitian consulted.cosyntropin test NEGATIVE-  discussed with her endocrinologist Dr. Cruzita Lederer she reports the new Cortisol cut off is 14 and cosyntropin test is negative.  Discussed w/ Endocrine- we ordered C peptide BHOB, SFU level, insulin, proinsulin, insulin antibody < monitor CBG in an effort to wean her down from dextrose, I reduced her D10 frequency to 40 cc/h and her blood sugar has been running in 40s although she remains asymptomatic.  We will bump it back to 75 cc/h.  I cannot find out any other source of hypoglycemia.  Her C-peptide was only slightly elevated.   Insulin/proinsulin still pending.  I personally discussed with patient's endocrinologist Dr. Renne Crigler today.  Patient remains a puzzle for [place].  She had recommended repeating C-peptide, proinsulin and insulin labs once again but recommended to draw the labs when blood sugar is less than 50.  I have relayed that recommendation to patient's primary nurse.  We will continue D10 at 75 cc/h. Recent Labs  Lab 04/26/20 2333 04/27/20 0350 04/27/20 0736 04/27/20 0832 04/27/20 1113  GLUCAP 145* 58* 37* 58* 59*   Hypotension: Hypotension in 65/56 on presentation, needing IV fluid bolus and has resolved since then.  Monitor.  Unintentional weight loss for past several months:TSH stable, had mammogram in December/21 normal.She is to undergo upper and lower endoscopy in several days as an outpatient with her gastroenterologist to rule out  GI malignancy. She needs to see her GI- please provide instruction at discharge.  Dietitian consulted multiple vitamin levels pending, augment diet/nutrition supplement.  Above documentation by previous hospitalist. To me she tells me that she had modified her diet significantly in an effort to lose the weight. She tells me that she was weighing over 420 pounds in 2018 and currently she is around 160 pounds. 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 ?steroid trial. Prn albuterol. OP f/u with pulm  Multiple electrolyte abnormalities including copper, zinc which are all being replaced by nutrition.  Appreciate their help.  CKD stage IIIa: Renal function stable.   Recent Labs  Lab 04/22/20 0306 04/23/20 0357 04/24/20 0509 04/25/20 0049 04/26/20 0207  BUN 26* 25* 22 26* 25*  CREATININE 1.20*  1.16* 1.15* 1.07* 1.10*   Hypomagnesemia  continue magnesium oxide.  Insomnia continue her Lunesta.  Diet Order            Diet regular Room service appropriate? Yes; Fluid consistency: Thin  Diet effective now               Patient's Body mass index is 27.14 kg/m.  DVT prophylaxis: ELIQUIS Code Status:   Code Status: Full Code  Family Communication: plan of care discussed with patient at bedside.  Status is: Inpatient Remains inpatient appropriate because:Ongoing diagnostic testing needed not appropriate for outpatient work up, IV treatments appropriate due to intensity of illness or inability to take PO and Inpatient level of care appropriate due to severity of illness   Dispo: The patient is from: Home              Anticipated d/c is to: Home hopefully 1-2 days once off D10.              Patient currently is not medically stable to d/c.   Difficult to place patient No    Unresulted Labs (From admission, onward)          Start     Ordered   04/27/20 1212  C-peptide  Once,   R       Question:  Specimen collection method  Answer:  Lab=Lab collect   04/27/20 1211   04/27/20 1211  Proinsulin/insulin ratio  Once,   R       Question:  Specimen collection method  Answer:  Lab=Lab collect   04/27/20 1211   04/27/20 1211  Insulin, random  Once,  R       Question:  Specimen collection method  Answer:  Lab=Lab collect   04/27/20 1211   04/26/20 1339  Miscellaneous [facility] test (send-out)  Once,   R       Comments: Selenium [id number]   Question:  Test name / description:  Answer:  Selenium [id number]   04/26/20 1339   04/23/20 1645  Vitamin A  Once,   R        04/23/20 1644   04/23/20 1645  Vitamin C  Once,   R        04/23/20 1644   04/23/20 1644  Vitamin B1  Once,   R        04/23/20 1644   04/23/20 0959  Insulin antibodies, blood  Add-on,   AD        04/23/20 1000   04/23/20 0957  Proinsulin/insulin ratio  Add-on,   AD        04/23/20 1000   04/23/20 0957  Sulfonylurea  Hypoglycemics Panel, blood  Add-on,   AD        04/23/20 1000          Medications reviewed:  Scheduled Meds: . apixaban  5 mg Oral BID  . calcitRIOL  0.25 mcg Oral q12n4p  . calcium carbonate  1 tablet Oral TID  . docusate sodium  100 mg Oral BID  . DULoxetine  60 mg Oral Daily  . feeding supplement  237 mL Oral TID BM  . folic acid  1 mg Oral Daily  . levothyroxine  137 mcg Oral Q0600  . magnesium oxide  400 mg Oral Daily  . methylPREDNISolone (SOLU-MEDROL) injection  125 mg Intravenous Once  . mirtazapine  7.5 mg Oral QHS  . montelukast  10 mg Oral QHS  . multivitamin with minerals  1 tablet Oral BID  . zinc sulfate  220 mg Oral Daily   Continuous Infusions: . copper chloride IVPB 2 mg (04/27/20 0829)  . dextrose 75 mL/hr at 04/26/20 1253    Consultants:see note  Procedures:see note  Antimicrobials: Anti-infectives (From admission, onward)   None     Objective: Vitals: Today's Vitals   04/26/20 2000 04/27/20 0504 04/27/20 0727 04/27/20 0824  BP:  114/66    Pulse:  74    Resp:  20    Temp:  98.2 F (36.8 C)    TempSrc:  Axillary    SpO2:  100%    Weight:      PainSc: 0-No pain  0-No pain 0-No pain    Intake/Output Summary (Last 24 hours) at 04/27/2020 1215 Last data filed at 04/27/2020 0300 Gross per 24 hour  Intake 2105.78 ml  Output --  Net 2105.78 ml   Filed Weights   04/22/20 1922  Weight: 78.6 kg   Weight change:   Intake/Output from previous day: 03/07 0701 - 03/08 0700 In: 2105.8 [I.V.:1905.8; IV Piggyback:200] Out: -  Intake/Output this shift: No intake/output data recorded. Filed Weights   04/22/20 1922  Weight: 78.6 kg    Examination:  General exam: Appears calm and comfortable  Respiratory system: Clear to auscultation. Respiratory effort normal. Cardiovascular system: S1 & S2 heard, RRR. No JVD, murmurs, rubs, gallops or clicks. No pedal edema. Gastrointestinal system: Abdomen is nondistended, soft and nontender. No organomegaly  or masses felt. Normal bowel sounds heard. Central nervous system: Alert and oriented. No focal neurological deficits. Extremities: Symmetric 5 x 5 power. Skin: No rashes, lesions or ulcers.  Psychiatry: Judgement  and insight appear normal. Mood & affect appropriate.    Data Reviewed: I have personally reviewed following labs and imaging studies CBC: Recent Labs  Lab 04/21/20 1556 04/22/20 0306 04/22/20 0402 04/23/20 0357 04/23/20 1150 04/23/20 1201 04/24/20 0509 04/25/20 0049  WBC 4.7 3.8*  --  3.8*  --   --  4.5 4.1  NEUTROABS 3.2 2.0  --   --   --   --   --   --   HGB 10.5* 9.0*   < > 9.5* 9.2* 9.2*  9.5* 8.9* 8.2*  HCT 33.8* 27.8*   < > 28.5* 27.0* 27.0*  28.0* 27.6* 24.7*  MCV 99.7 98.9  --  96.3  --   --  96.5 95.7  PLT 175 149*  --  152  --   --  145* 144*   < > = values in this interval not displayed.   Basic Metabolic Panel: Recent Labs  Lab 04/22/20 0306 04/22/20 0402 04/23/20 0357 04/23/20 1150 04/23/20 1201 04/24/20 0509 04/25/20 0049 04/26/20 0207  NA 141   < > 139 141 140  140 136 137 139  K 3.9   < > 3.8 3.9 4.1  4.1 3.7 4.0 4.5  CL 112*  --  109  --   --  108 109 109  CO2 22  --  22  --   --  21* 22 22  GLUCOSE 67*  --  86  --   --  124* 75 68*  BUN 26*  --  25*  --   --  22 26* 25*  CREATININE 1.20*  --  1.16*  --   --  1.15* 1.07* 1.10*  CALCIUM 6.3*  --  6.7*  --   --  6.9* 7.0* 7.5*  MG 1.6*  --   --   --   --   --   --   --    < > = values in this interval not displayed.   GFR: Estimated Creatinine Clearance: 53.6 mL/min (A) (by C-G formula based on SCr of 1.1 mg/dL (H)). Liver Function Tests: Recent Labs  Lab 04/22/20 0306 04/23/20 0357 04/24/20 0509 04/25/20 0049  AST 21 21 22 21   ALT 24 22 25 24   ALKPHOS 56 51 61 54  BILITOT 0.8 0.7 0.6 0.5  PROT 4.8* 4.4* 4.5* 4.2*  ALBUMIN 2.0* 1.9* 1.9* 1.8*   No results for input(s): LIPASE, AMYLASE in the last 168 hours. No results for input(s): AMMONIA in the last 168  hours. Coagulation Profile: No results for input(s): INR, PROTIME in the last 168 hours. Cardiac Enzymes: No results for input(s): CKTOTAL, CKMB, CKMBINDEX, TROPONINI in the last 168 hours. BNP (last 3 results) Recent Labs    06/13/19 1429 04/02/20 0941  PROBNP 251.0* 186.0*   HbA1C: Recent Labs    04/25/20 0049  HGBA1C 4.0*   CBG: Recent Labs  Lab 04/26/20 2333 04/27/20 0350 04/27/20 0736 04/27/20 0832 04/27/20 1113  GLUCAP 145* 58* 37* 58* 59*   Lipid Profile: No results for input(s): CHOL, HDL, LDLCALC, TRIG, CHOLHDL, LDLDIRECT in the last 72 hours. Thyroid Function Tests: No results for input(s): TSH, T4TOTAL, FREET4, T3FREE, THYROIDAB in the last 72 hours. Anemia Panel: No results for input(s): VITAMINB12, FOLATE, FERRITIN, TIBC, IRON, RETICCTPCT in the last 72 hours. Sepsis Labs: No results for input(s): PROCALCITON, LATICACIDVEN in the last 168 hours.  Recent Results (from the past 240 hour(s))  SARS CORONAVIRUS 2 (TAT 6-24 HRS) Nasopharyngeal Nasopharyngeal Swab  Status: None   Collection Time: 04/22/20  2:00 AM   Specimen: Nasopharyngeal Swab  Result Value Ref Range Status   SARS Coronavirus 2 NEGATIVE NEGATIVE Final    Comment: (NOTE) SARS-CoV-2 target nucleic acids are NOT DETECTED.  The SARS-CoV-2 RNA is generally detectable in upper and lower respiratory specimens during the acute phase of infection. Negative results do not preclude SARS-CoV-2 infection, do not rule out co-infections with other pathogens, and should not be used as the sole basis for treatment or other patient management decisions. Negative results must be combined with clinical observations, patient history, and epidemiological information. The expected result is Negative.  Fact Sheet for Patients: SugarRoll.be  Fact Sheet for Healthcare Providers: https://www.woods-mathews.com/  This test is not yet approved or cleared by the [place] FDA and  has been authorized for detection and/or diagnosis of SARS-CoV-2 by FDA under an Emergency Use Authorization (EUA). This EUA will remain  in effect (meaning this test can be used) for the duration of the COVID-19 declaration under Se ction 564(b)(1) of the Act, 21 U.S.C. section 360bbb-3(b)(1), unless the authorization is terminated or revoked sooner.  Performed at [facility], [place] [address], [place], [place] [address]      Radiology Studies: No results found.   LOS: 5 days   Darliss Cheney, MD [facility]  04/27/2020, 12:15 PM

## 2020-04-27 NOTE — Progress Notes (Signed)
BS 50 per libre glucose monitor

## 2020-04-28 ENCOUNTER — Ambulatory Visit: Payer: Medicare Other | Admitting: Adult Health

## 2020-04-28 DIAGNOSIS — E162 Hypoglycemia, unspecified: Secondary | ICD-10-CM | POA: Diagnosis not present

## 2020-04-28 DIAGNOSIS — I482 Chronic atrial fibrillation, unspecified: Secondary | ICD-10-CM | POA: Diagnosis not present

## 2020-04-28 DIAGNOSIS — I10 Essential (primary) hypertension: Secondary | ICD-10-CM | POA: Diagnosis not present

## 2020-04-28 DIAGNOSIS — N1831 Chronic kidney disease, stage 3a: Secondary | ICD-10-CM | POA: Diagnosis not present

## 2020-04-28 LAB — MISC LABCORP TEST (SEND OUT): Labcorp test code: 716910

## 2020-04-28 LAB — C-PEPTIDE: C-Peptide: 2.3 ng/mL (ref 1.1–4.4)

## 2020-04-28 LAB — POCT CBG MONITORING
Blood Glucose, Fingerstick: 76
Blood Glucose, Fingerstick: 86
Blood Glucose, Fingerstick: 61
Blood Glucose, Fingerstick: 78
Blood Glucose, Fingerstick: 102

## 2020-04-28 LAB — GLUCOSE, POCT (MANUAL RESULT ENTRY)
POC Glucose: 68 mg/dl — AB (ref 70–99)
POC Glucose: 76 mg/dl (ref 70–99)

## 2020-04-28 LAB — INSULIN, RANDOM: Insulin: 2.2 u[IU]/mL — ABNORMAL LOW (ref 2.6–24.9)

## 2020-04-28 MED ORDER — ASCORBIC ACID 500 MG PO TABS: 250.0000 mg | ORAL_TABLET | Freq: Two times a day (BID) | ORAL | Status: DC

## 2020-04-28 MED ORDER — SELENIUM 200 MCG PO TABS
200.0000 ug | ORAL_TABLET | Freq: Every day | ORAL | Status: DC
  Administered 2020-04-28 – 2020-05-04 (×7): 200 ug via ORAL
  Filled 2020-04-28 (×7): qty 1

## 2020-04-28 MED ORDER — ASCORBIC ACID 500 MG PO TABS
500.0000 mg | ORAL_TABLET | Freq: Two times a day (BID) | ORAL | Status: DC
  Administered 2020-04-28 – 2020-05-04 (×12): 500 mg via ORAL
  Filled 2020-04-28 (×12): qty 1

## 2020-04-28 NOTE — Progress Notes (Addendum)
Nutrition Follow Up  DOCUMENTATION CODES:   Severe malnutrition in context of chronic illness  INTERVENTION:   Replete selenium 2-3 mcg/kg/day   Replete Vitamin C 500 mg BID  Replete copper 2 mg daily (last dose today)  Replete zinc sulfate 220 mg daily    Ensure Enlive po TID, each supplement provides 350 kcal and 20 grams of protein  MVI with minerals BID  Continue calcium carbonate 500 mg to TID   (MVI and calcium need to be taken at least two hours apart)  NUTRITION DIAGNOSIS:   Severe Malnutrition related to chronic illness (hx RYGB, CHF) as evidenced by severe fat depletion,severe muscle depletion,percent weight loss,energy intake < or equal to 75% for > or equal to 1 month.  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 Presents this admission with CHF exacerbation.   Patient reports eating around 50% of each meal yesterday and consumed one Ensure. States today intake has dropped just slightly but she is feeling better. RD observed bites from a sandwich consumed from lunch tray. Patient plans on eating more later. Patient had 1/2 an Ensure this am. RD continued to encourage small more frequent meals with Ensure in between.   Discussed individual nutrients patient needs to take indefinitely given history of RYGB. Provided handout of bariatric multivitamins patient could purchase at [place] or on [place]. Briefly discussed timing of administration of each but need to go more in depth at later date.  Of note, MD of patients last eye  exam noted sun spots on bilateral eyes. Still awaiting Vitamin A lab. Will replete as needed.   Hypoglycemia slightly improved today. D10 decreased to 50 ml/hr from 75 ml/hr.   Admission weight: 78.6 kg  No current weight obtained   Vitamin/MIneral Profile:  Thiamine B1: pending  Vitamin B12: 639 (wdl) Vitamin A: pending  Vitamin D: 42 (wdl) Vitamin C: 0.2 (L)   Copper: 44 (L) Selenium: 87 (L)   Zinc: 45 (low normal)   Drips: copper chloride (last dose) D10 @ 50 ml/hr  Medications: calcitriol BID, TUMS TID, colace, folic acid, 20 mg lasix, mag-ox, solumedrol, remeron, MVI BID, zofran Q6 hrs, zinc sulfate 220 mg  Labs: CBG 37-63 insulin 2.2 (L)   Diet Order:   Diet Order            Diet regular Room service appropriate? Yes; Fluid consistency: Thin  Diet effective now                 EDUCATION NEEDS:   Not appropriate for education at this time  Skin:  Skin Assessment: Reviewed RN Assessment  Last BM:  3/6  Height:   Ht Readings from Last 1 Encounters:  04/19/20 5\' 7"  (1.702 m)    Weight:   Wt Readings from Last 1 Encounters:  04/22/20 78.6 kg    BMI:  Body mass index is 27.14 kg/m.  Estimated Nutritional Needs:   Kcal:  2000-2200 kcal  Protein:  100-120 grams  Fluid:  >/= 2 L/day  Mariana Single RD, LDN Clinical Nutrition Pager listed in [place]

## 2020-04-28 NOTE — Progress Notes (Signed)
CSW met with pt to discuss readmission risk items.  Pt has own vehicle for transportation to medical appointments.  Pt agreeable to hospital discharge appointment being scheduled and this was done. Lurline Idol, MSW, LCSW 3/9/202210:18 AM

## 2020-04-28 NOTE — Plan of Care (Signed)
  Problem: Nutritional: Goal: Maintenance of adequate nutrition will improve Outcome: Progressing   Problem: Nutritional: Goal: Progress toward achieving an optimal weight will improve Outcome: Progressing   Problem: Skin Integrity: Goal: Risk for impaired skin integrity will decrease Outcome: Progressing   Problem: Tissue Perfusion: Goal: Adequacy of tissue perfusion will improve Outcome: Progressing

## 2020-04-28 NOTE — Progress Notes (Signed)
Patient ID: Jillian Eaton, female   DOB: 10/26/1950, 70 y.o.   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 On presentation, BNP was 167 and troponin was 4 and patient was found to have leg edema.  Cardiology was consulted and patient underwent right heart cath that showed no evidence of pulmonary hypertension and no increase left sided filling pressures.  Subsequently PCCM was consulted who also subsequently signed off.  Patient has had episodes of hypoglycemia requiring D10 infusion.  Assessment & Plan:   Acute chest pain with shortness of breath/hypoxic respiratory failure/pulmonary hypertension and imaging/chronic diastolic heart failure -Patient had outpatient CT PE study which showed no PE but showed evidence of pulmonary hypertension and small bilateral pleural effusion -Cardiology consulted: 2D echo showed EF of 55 to 60% -Her PFT from 03/01/2019 showed obstructive lung disease -s/p right heart cath 04/23/20-that showed no evidence of pulmonary hypertension and no increased left-sided filling pressure-shortness of breath hypoxia not explained by congestive heart failure - She was seen by pulmonary: Pulmonary is of the opinion that her symptoms could be due to asthma/possible vocal cord dysfunction. pulmonary has advised outpatient follow-up and also ENT evaluation as outpatient.  -Respiratory status  has currently much improved and currently on room air  Hypoglycemia -Patient reports having recent hypoglycemia in outpatient blood work as outpatient.  Has been hypoglycemic here needing D10 infusion. ?  Etiology likely in the setting of gastric bypass, has not had great appetite/and seems particular about food.  Dietitian consulted. -cosyntropin test NEGATIVE -Prior hospitalist discussed with her endocrinologist/ Dr. Elvera Lennox: she reported that the new Cortisol cut off is 14 and cosyntropin test is negative. C peptide BHOB, SFU level, insulin, proinsulin, insulin antibody  were ordered. - Her C-peptide was only slightly elevated.  Insulin/proinsulin still pending.  Case was discussed again with Dr. Elvera Lennox by prior hospitalist who had recommended repeating C-peptide, proinsulin and insulin labs once again but recommended to draw the labs when blood sugar is less than 50.  -Decrease D10 to 50 cc an hour  Chronic A. Fib Complete heart block Status post AV nodal ablation and Medtronic pacemaker placement in 4/21 -Continue Eliquis.  Not on AV nodal blocker  Hypotension -Resolved with IV fluids  Unintentional weight loss for past several months -TSH stable.  Mammogram in December 2021 was normal -Has follow-up with GI as an outpatient for upper and lower endoscopy to rule out GI malignancy -Follow nutrition recommendations  History of thyroid cancer/postsurgical hypothyroidism and hypocalcemia/osteoporosis -Continue Synthroid and calcium supplementation  Mild intermittent asthma -Stable.  Outpatient follow-up with pulmonary  Chronic renal disease stage IIIa -Renal function stable.  Hypomagnesemia -Continue supplementation  Insomnia -Continue Lunesta  DVT prophylaxis: Eliquis Code Status: Full Family Communication: None at bedside Disposition Plan: Status is: Inpatient  Remains inpatient appropriate because:Inpatient level of care appropriate due to severity of  illness   Dispo: The patient is from: Home              Anticipated d/c is to: Home in 1 to 2 days  once clinically improved              Patient currently is not medically stable to d/c.   Difficult to place patient No  Consultants: Cardiology/PCCM.  Phone communication with endocrinology/Dr. Elvera Lennox by prior hospitalist Procedures: None  Antimicrobials: None   Subjective: Patient seen and examined at bedside.  She denies any worsening shortness breath, nausea, vomiting, fever or chest pains  Objective: Vitals:   04/26/20 1400 04/27/20 0504 04/27/20 1503 04/27/20 2136  BP: 94/68 114/66 104/64 116/72  Pulse: 60 74 60 61  Resp: 16 20 15 20   Temp: 98.2 F (36.8 C) 98.2 F (36.8 C) 98.5 F (36.9 C) 98.4 F (36.9 C)  TempSrc:  Axillary Oral Oral  SpO2: 100% 100% 100% 100%  Weight:        Intake/Output Summary (Last 24 hours) at 04/28/2020 1200 Last data filed at 04/28/2020 0953 Gross per 24 hour  Intake -  Output 850 ml  Net -850 ml   Filed Weights   04/22/20 1922  Weight: 78.6 kg    Examination:  General exam: Appears calm and comfortable.  Currently on room air Respiratory system: Bilateral decreased breath sounds at bases with some scattered crackles Cardiovascular system: S1 & S2 heard, Rate controlled Gastrointestinal system: Abdomen is nondistended, soft and nontender. Normal bowel sounds heard. Extremities: No cyanosis, clubbing; trace lower extremity edema Central nervous system: Alert and oriented. No focal neurological deficits. Moving extremities Skin: No rashes, lesions or ulcers Psychiatry: Judgement and insight appear normal. Mood & affect appropriate.     Data Reviewed: I have personally reviewed following labs and imaging studies  CBC: Recent Labs  Lab 04/21/20 1556 04/22/20 0306 04/22/20 0402 04/23/20 0357 04/23/20 1150 04/23/20 1201 04/24/20 0509 04/25/20 0049  WBC 4.7 3.8*  --  3.8*  --   --  4.5 4.1  NEUTROABS 3.2 2.0  --   --   --    --   --   --   HGB 10.5* 9.0*   < > 9.5* 9.2* 9.2*  9.5* 8.9* 8.2*  HCT 33.8* 27.8*   < > 28.5* 27.0* 27.0*  28.0* 27.6* 24.7*  MCV 99.7 98.9  --  96.3  --   --  96.5 95.7  PLT 175 149*  --  152  --   --  145* 144*   < > = values in this interval not displayed.   Basic Metabolic Panel: Recent Labs  Lab 04/22/20 0306 04/22/20 0402 04/23/20 0357 04/23/20 1150 04/23/20 1201 04/24/20 0509 04/25/20 0049 04/26/20 0207  NA 141   < > 139 141 140  140 136 137 139  K 3.9   < > 3.8 3.9 4.1  4.1 3.7 4.0 4.5  CL 112*  --  109  --   --  108 109 109  CO2 22  --  22  --   --  21* 22 22  GLUCOSE 67*  --  86  --   --  124* 75 68*  BUN 26*  --  25*  --   --  22 26* 25*  CREATININE 1.20*  --  1.16*  --   --  1.15* 1.07* 1.10*  CALCIUM 6.3*  --  6.7*  --   --  6.9* 7.0* 7.5*  MG 1.6*  --   --   --   --   --   --   --    < > = values in this interval not displayed.  GFR: Estimated Creatinine Clearance: 53.6 mL/min (A) (by C-G formula based on SCr of 1.1 mg/dL (H)). Liver Function Tests: Recent Labs  Lab 04/22/20 0306 04/23/20 0357 04/24/20 0509 04/25/20 0049  AST 21 21 22 21   ALT 24 22 25 24   ALKPHOS 56 51 61 54  BILITOT 0.8 0.7 0.6 0.5  PROT 4.8* 4.4* 4.5* 4.2*  ALBUMIN 2.0* 1.9* 1.9* 1.8*   No results for input(s): LIPASE, AMYLASE in the last 168 hours. No results for input(s): AMMONIA in the last 168 hours. Coagulation Profile: No results for input(s): INR, PROTIME in the last 168 hours. Cardiac Enzymes: No results for input(s): CKTOTAL, CKMB, CKMBINDEX, TROPONINI in the last 168 hours. BNP (last 3 results) Recent Labs    06/13/19 1429 04/02/20 0941  PROBNP 251.0* 186.0*   HbA1C: No results for input(s): HGBA1C in the last 72 hours. CBG: Recent Labs  Lab 04/27/20 0350 04/27/20 0736 04/27/20 0832 04/27/20 1113 04/27/20 1410  GLUCAP 58* 37* 58* 59* 63*   Lipid Profile: No results for input(s): CHOL, HDL, LDLCALC, TRIG, CHOLHDL, LDLDIRECT in the last 72  hours. Thyroid Function Tests: No results for input(s): TSH, T4TOTAL, FREET4, T3FREE, THYROIDAB in the last 72 hours. Anemia Panel: No results for input(s): VITAMINB12, FOLATE, FERRITIN, TIBC, IRON, RETICCTPCT in the last 72 hours. Sepsis Labs: No results for input(s): PROCALCITON, LATICACIDVEN in the last 168 hours.  Recent Results (from the past 240 hour(s))  SARS CORONAVIRUS 2 (TAT 6-24 HRS) Nasopharyngeal Nasopharyngeal Swab     Status: None   Collection Time: 04/22/20  2:00 AM   Specimen: Nasopharyngeal Swab  Result Value Ref Range Status   SARS Coronavirus 2 NEGATIVE NEGATIVE Final    Comment: (NOTE) SARS-CoV-2 target nucleic acids are NOT DETECTED.  The SARS-CoV-2 RNA is generally detectable in upper and lower respiratory specimens during the acute phase of infection. Negative results do not preclude SARS-CoV-2 infection, do not rule out co-infections with other pathogens, and should not be used as the sole basis for treatment or other patient management decisions. Negative results must be combined with clinical observations, patient history, and epidemiological information. The expected result is Negative.  Fact Sheet for Patients: HairSlick.no  Fact Sheet for Healthcare Providers: quierodirigir.com  This test is not yet approved or cleared by the [place] FDA and  has been authorized for detection and/or diagnosis of SARS-CoV-2 by FDA under an Emergency Use Authorization (EUA). This EUA will remain  in effect (meaning this test can be used) for the duration of the COVID-19 declaration under Se ction 564(b)(1) of the Act, 21 U.S.C. section 360bbb-3(b)(1), unless the authorization is terminated or revoked sooner.  Performed at [facility], [address], [place], [place] [address]          Radiology Studies: No results found.      Scheduled Meds: . apixaban  5 mg Oral BID  . calcitRIOL   0.25 mcg Oral q12n4p  . calcium carbonate  1 tablet Oral TID  . docusate sodium  100 mg Oral BID  . DULoxetine  60 mg Oral Daily  . feeding supplement  237 mL Oral TID BM  . folic acid  1 mg Oral Daily  . levothyroxine  137 mcg Oral Q0600  . magnesium oxide  400 mg Oral Daily  . methylPREDNISolone (SOLU-MEDROL) injection  125 mg Intravenous Once  . mirtazapine  7.5 mg Oral QHS  . montelukast  10 mg Oral QHS  . multivitamin with minerals  1  tablet Oral BID  . zinc sulfate  220 mg Oral Daily   Continuous Infusions: . dextrose 50 mL/hr at 04/28/20 1014          Saad Buhl, MD [facility] 04/28/2020, 12:00 PM

## 2020-04-28 NOTE — Plan of Care (Signed)
  Problem: Education: Goal: Ability to describe self-care measures that may prevent or decrease complications (Diabetes Survival Skills Education) will improve Outcome: Progressing   Problem: Fluid Volume: Goal: Ability to maintain a balanced intake and output will improve Outcome: Progressing   Problem: Nutritional: Goal: Maintenance of adequate nutrition will improve Outcome: Progressing   

## 2020-04-29 ENCOUNTER — Encounter: Payer: Self-pay | Admitting: Internal Medicine

## 2020-04-29 ENCOUNTER — Other Ambulatory Visit: Payer: Medicare Other

## 2020-04-29 ENCOUNTER — Inpatient Hospital Stay (HOSPITAL_COMMUNITY): Payer: Medicare Other

## 2020-04-29 ENCOUNTER — Telehealth: Payer: Self-pay | Admitting: Oncology

## 2020-04-29 DIAGNOSIS — I10 Essential (primary) hypertension: Secondary | ICD-10-CM | POA: Diagnosis not present

## 2020-04-29 DIAGNOSIS — R609 Edema, unspecified: Secondary | ICD-10-CM | POA: Diagnosis not present

## 2020-04-29 DIAGNOSIS — N1831 Chronic kidney disease, stage 3a: Secondary | ICD-10-CM | POA: Diagnosis not present

## 2020-04-29 DIAGNOSIS — I482 Chronic atrial fibrillation, unspecified: Secondary | ICD-10-CM | POA: Diagnosis not present

## 2020-04-29 DIAGNOSIS — G40909 Epilepsy, unspecified, not intractable, without status epilepticus: Secondary | ICD-10-CM

## 2020-04-29 DIAGNOSIS — E162 Hypoglycemia, unspecified: Secondary | ICD-10-CM | POA: Diagnosis not present

## 2020-04-29 LAB — CBC WITH DIFFERENTIAL/PLATELET
Abs Immature Granulocytes: 0.01 10*3/uL (ref 0.00–0.07)
Basophils Absolute: 0.1 10*3/uL (ref 0.0–0.1)
Basophils Relative: 1 %
Eosinophils Absolute: 0.1 10*3/uL (ref 0.0–0.5)
Eosinophils Relative: 2 %
HCT: 23.5 % — ABNORMAL LOW (ref 36.0–46.0)
Hemoglobin: 7.9 g/dL — ABNORMAL LOW (ref 12.0–15.0)
Immature Granulocytes: 0 %
Lymphocytes Relative: 28 %
Lymphs Abs: 1.2 10*3/uL (ref 0.7–4.0)
MCH: 32.1 pg (ref 26.0–34.0)
MCHC: 33.6 g/dL (ref 30.0–36.0)
MCV: 95.5 fL (ref 80.0–100.0)
Monocytes Absolute: 0.5 10*3/uL (ref 0.1–1.0)
Monocytes Relative: 11 %
Neutro Abs: 2.4 10*3/uL (ref 1.7–7.7)
Neutrophils Relative %: 58 %
Platelets: 167 10*3/uL (ref 150–400)
RBC: 2.46 MIL/uL — ABNORMAL LOW (ref 3.87–5.11)
RDW: 15.7 % — ABNORMAL HIGH (ref 11.5–15.5)
WBC: 4.3 10*3/uL (ref 4.0–10.5)
nRBC: 0 % (ref 0.0–0.2)

## 2020-04-29 LAB — MAGNESIUM: Magnesium: 1.7 mg/dL (ref 1.7–2.4)

## 2020-04-29 LAB — GLUCOSE, POCT (MANUAL RESULT ENTRY)
POC Glucose: 55 mg/dl — AB (ref 70–99)
POC Glucose: 71 mg/dl (ref 70–99)

## 2020-04-29 LAB — BASIC METABOLIC PANEL
Anion gap: 4 — ABNORMAL LOW (ref 5–15)
BUN: 28 mg/dL — ABNORMAL HIGH (ref 8–23)
CO2: 27 mmol/L (ref 22–32)
Calcium: 7.8 mg/dL — ABNORMAL LOW (ref 8.9–10.3)
Chloride: 106 mmol/L (ref 98–111)
Creatinine, Ser: 1.1 mg/dL — ABNORMAL HIGH (ref 0.44–1.00)
GFR, Estimated: 55 mL/min — ABNORMAL LOW (ref >60)
Glucose, Bld: 81 mg/dL (ref 70–99)
Potassium: 4.6 mmol/L (ref 3.5–5.1)
Sodium: 137 mmol/L (ref 135–145)

## 2020-04-29 LAB — POCT CBG MONITORING
Blood Glucose, Fingerstick: 63
Blood Glucose, Fingerstick: 93
Blood Glucose, Fingerstick: 106
Blood Glucose, Fingerstick: 167

## 2020-04-29 MED ORDER — ADULT MULTIVITAMIN W/MINERALS CH
1.0000 | ORAL_TABLET | Freq: Two times a day (BID) | ORAL | Status: DC
  Administered 2020-04-29 – 2020-05-04 (×10): 1 via ORAL
  Filled 2020-04-29 (×11): qty 1

## 2020-04-29 MED ORDER — SODIUM CHLORIDE 0.9% FLUSH: 3.0000 mL | Freq: Two times a day (BID) | INTRAVENOUS | Status: DC

## 2020-04-29 MED ORDER — DEXTROSE 5 % IV SOLN: INTRAVENOUS | Status: DC

## 2020-04-29 MED ORDER — SODIUM CHLORIDE 0.9 % IV SOLN: INTRAVENOUS | Status: DC

## 2020-04-29 MED ORDER — SODIUM CHLORIDE 0.9% FLUSH: 3.0000 mL | INTRAVENOUS | Status: DC | PRN

## 2020-04-29 MED ORDER — METHYLPREDNISOLONE SODIUM SUCC 125 MG IJ SOLR
80.0000 mg | Freq: Once | INTRAMUSCULAR | Status: AC
  Administered 2020-04-29: 80 mg via INTRAVENOUS
  Filled 2020-04-29: qty 2

## 2020-04-29 MED ORDER — SODIUM CHLORIDE 0.9 % IV SOLN: 250.0000 mL | INTRAVENOUS | Status: DC | PRN

## 2020-04-29 MED ORDER — ONDANSETRON HCL 4 MG/2ML IJ SOLN: 4.0000 mg | Freq: Four times a day (QID) | INTRAMUSCULAR | Status: DC | PRN

## 2020-04-29 MED ORDER — ACETAMINOPHEN 325 MG PO TABS
650.0000 mg | ORAL_TABLET | ORAL | Status: DC | PRN
  Administered 2020-04-30: 650 mg via ORAL

## 2020-04-29 MED ORDER — APIXABAN 5 MG PO TABS: 5.0000 mg | ORAL_TABLET | Freq: Two times a day (BID) | ORAL | Status: DC

## 2020-04-29 MED ORDER — LABETALOL HCL 5 MG/ML IV SOLN: 10.0000 mg | INTRAVENOUS | Status: AC | PRN

## 2020-04-29 MED ORDER — HYDRALAZINE HCL 20 MG/ML IJ SOLN: 10.0000 mg | INTRAMUSCULAR | Status: AC | PRN

## 2020-04-29 MED ORDER — CALCIUM CARBONATE ANTACID 500 MG PO CHEW
1.0000 | CHEWABLE_TABLET | Freq: Three times a day (TID) | ORAL | Status: DC
  Administered 2020-04-29 – 2020-05-04 (×15): 200 mg via ORAL
  Filled 2020-04-29 (×16): qty 1

## 2020-04-29 NOTE — Telephone Encounter (Signed)
Rescheduled upcoming appointment per 3/10 schedule message. Patient is aware of changes.

## 2020-04-29 NOTE — Progress Notes (Signed)
Patient ID: Jillian Eaton, female   DOB: Jul 01, 1950, 70 y.o.   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 On presentation, BNP was 167 and troponin was 4 and patient was found to have leg edema.  Cardiology was consulted and patient underwent right heart cath that showed no evidence of pulmonary hypertension and no increase left sided filling pressures.  Subsequently PCCM was consulted who also subsequently signed off.  Patient has had episodes of hypoglycemia requiring D10 infusion.  Assessment & Plan:   Acute chest pain with shortness of breath/hypoxic respiratory failure/pulmonary hypertension and imaging/chronic diastolic heart failure -Patient had outpatient CT PE study which showed no PE but showed evidence of pulmonary hypertension and small bilateral pleural effusion -Cardiology consulted: 2D echo showed EF of 55 to 60% -Her PFT from 03/01/2019 showed obstructive lung disease -s/p right heart cath 04/23/20-that showed no evidence of pulmonary hypertension and no increased left-sided filling pressure-shortness of breath hypoxia not explained by congestive heart failure - She was seen by pulmonary: Pulmonary is of the opinion that her symptoms could be due to asthma/possible vocal cord dysfunction. pulmonary has advised outpatient follow-up and also ENT evaluation as outpatient.  -Respiratory status  has currently much improved and currently on room air  Hypoglycemia -Patient reports having recent hypoglycemia in outpatient blood work as outpatient.  Has been hypoglycemic here needing D10 infusion. ?  Etiology likely in the setting of gastric bypass, has not had great appetite/and seems particular about food.  Dietitian consulted. -cosyntropin test NEGATIVE -Prior hospitalist discussed with her endocrinologist/ Dr. Cruzita Lederer: she reported that the new Cortisol cut off is 14 and cosyntropin test is negative. C peptide BHOB, SFU level, insulin, proinsulin, insulin antibody  were ordered. - Her C-peptide was only slightly elevated.  Insulin/proinsulin still pending.  Case was discussed again with Dr. Cruzita Lederer by prior hospitalist who had recommended repeating C-peptide, proinsulin and insulin labs once again but recommended to draw the labs when blood sugar is less than 50.  -Blood sugar still in the lower side.  Currently on D10 at 50 cc an hour. Decrease IV fluids to D5 at 50 cc an hour.  Chronic A. Fib Complete heart block Status post AV nodal ablation and Medtronic pacemaker placement in 4/21 -Continue Eliquis.  Not on AV nodal blocker  Hypotension -Resolved with IV fluids  Unintentional weight loss for past several months -TSH stable.  Mammogram in December 2021 was normal -Has follow-up with GI as an outpatient for upper and lower endoscopy to rule out GI malignancy -Follow nutrition recommendations  History of thyroid cancer/postsurgical hypothyroidism and hypocalcemia/osteoporosis -Continue Synthroid and calcium supplementation  Mild intermittent asthma -Stable.  Outpatient follow-up with pulmonary  Chronic renal disease stage IIIa -Renal function stable.  Hypomagnesemia -Continue supplementation  Insomnia -Continue Lunesta  DVT prophylaxis: Eliquis Code Status: Full Family Communication: None at bedside Disposition Plan: Status is: Inpatient  Remains inpatient  appropriate because:Inpatient level of care appropriate due to severity of illness   Dispo: The patient is from: Home  Anticipated d/c is to: Home in 1 to 2 days once clinically improved              Patient currently is not medically stable to d/c.   Difficult to place patient No  Consultants: Cardiology/PCCM.  Phone communication with endocrinology/Dr. Cruzita Lederer by prior hospitalist Procedures: None  Antimicrobials: None   Subjective: Patient seen and examined at bedside.  Denies overnight fever, nausea, vomiting or diarrhea Objective: Vitals:   04/28/20 1444 04/28/20 1519 04/28/20 2123 04/29/20 0608  BP: 92/69  (!) 89/55 95/61  Pulse: 62  60 65  Resp: 17  16 18   Temp: 98.8 F (37.1 C)  98.5 F (36.9 C) 98.4 F (36.9 C)  TempSrc:   Oral Oral  SpO2: 100%  99% 98%  Weight:  78.7 kg      Intake/Output Summary (Last 24 hours) at 04/29/2020 0716 Last data filed at 04/29/2020 0037 Gross per 24 hour  Intake --  Output 2550 ml  Net -2550 ml   Filed Weights   04/22/20 1922 04/28/20 1519  Weight: 78.6 kg 78.7 kg    Examination:  General exam: No acute distress.  Still on room air  respiratory system: Decreased breath sounds at bases bilaterally with no wheezing cardiovascular system: Rate controlled, S1-S2 heard  gastrointestinal system: Abdomen is nondistended, soft and nontender.  Bowel sounds are heard  extremities: Mild lower extremity edema present; no clubbing Central nervous system: Awake and alert.  No focal neurological deficits.  Moves extremities  skin: No obvious ecchymosis/lesions Psychiatry: Mood, affect and judgment appear normal  Data Reviewed: I have personally reviewed following labs and imaging studies  CBC: Recent Labs  Lab 04/23/20 0357 04/23/20 1150 04/23/20 1201 04/24/20 0509 04/25/20 0049 04/29/20 0028  WBC 3.8*  --   --  4.5 4.1 4.3  NEUTROABS  --   --   --   --   --  2.4  HGB 9.5* 9.2* 9.2*  9.5* 8.9* 8.2* 7.9*  HCT 28.5*  27.0* 27.0*  28.0* 27.6* 24.7* 23.5*  MCV 96.3  --   --  96.5 95.7 95.5  PLT 152  --   --  145* 144* 492   Basic Metabolic Panel: Recent Labs  Lab 04/23/20 0357 04/23/20 1150 04/23/20 1201 04/24/20 0509 04/25/20 0049 04/26/20 0207 04/29/20 0028  NA 139   < > 140  140 136 137 139 137  K 3.8   < > 4.1  4.1 3.7 4.0 4.5 4.6  CL 109  --   --  108 109 109 106  CO2 22  --   --  21* 22 22 27   GLUCOSE 86  --   --  124* 75 68* 81  BUN 25*  --   --  22 26* 25* 28*  CREATININE 1.16*  --   --  1.15* 1.07* 1.10* 1.10*  CALCIUM 6.7*  --   --  6.9* 7.0* 7.5* 7.8*  MG  --   --   --   --   --   --  1.7   < > = values in this interval not displayed.   GFR: Estimated Creatinine Clearance: 53.6 mL/min (A) (by C-G formula based on SCr of 1.1 mg/dL (H)). Liver Function Tests: Recent Labs  Lab 04/23/20 0357 04/24/20 0509 04/25/20 0049  AST 21 22 21   ALT 22 25 24   ALKPHOS 51 61 54  BILITOT 0.7 0.6 0.5  PROT 4.4* 4.5* 4.2*  ALBUMIN 1.9* 1.9* 1.8*  No results for input(s): LIPASE, AMYLASE in the last 168 hours. No results for input(s): AMMONIA in the last 168 hours. Coagulation Profile: No results for input(s): INR, PROTIME in the last 168 hours. Cardiac Enzymes: No results for input(s): CKTOTAL, CKMB, CKMBINDEX, TROPONINI in the last 168 hours. BNP (last 3 results) Recent Labs    06/13/19 1429 04/02/20 0941  PROBNP 251.0* 186.0*   HbA1C: No results for input(s): HGBA1C in the last 72 hours. CBG: Recent Labs  Lab 04/27/20 0350 04/27/20 0736 04/27/20 0832 04/27/20 1113 04/27/20 1410  GLUCAP 58* 37* 58* 59* 63*   Lipid Profile: No results for input(s): CHOL, HDL, LDLCALC, TRIG, CHOLHDL, LDLDIRECT in the last 72 hours. Thyroid Function Tests: No results for input(s): TSH, T4TOTAL, FREET4, T3FREE, THYROIDAB in the last 72 hours. Anemia Panel: No results for input(s): VITAMINB12, FOLATE, FERRITIN, TIBC, IRON, RETICCTPCT in the last 72 hours. Sepsis Labs: No results for  input(s): PROCALCITON, LATICACIDVEN in the last 168 hours.  Recent Results (from the past 240 hour(s))  SARS CORONAVIRUS 2 (TAT 6-24 HRS) Nasopharyngeal Nasopharyngeal Swab     Status: None   Collection Time: 04/22/20  2:00 AM   Specimen: Nasopharyngeal Swab  Result Value Ref Range Status   SARS Coronavirus 2 NEGATIVE NEGATIVE Final    Comment: (NOTE) SARS-CoV-2 target nucleic acids are NOT DETECTED.  The SARS-CoV-2 RNA is generally detectable in upper and lower respiratory specimens during the acute phase of infection. Negative results do not preclude SARS-CoV-2 infection, do not rule out co-infections with other pathogens, and should not be used as the sole basis for treatment or other patient management decisions. Negative results must be combined with clinical observations, patient history, and epidemiological information. The expected result is Negative.  Fact Sheet for Patients: SugarRoll.be  Fact Sheet for Healthcare Providers: https://www.woods-mathews.com/  This test is not yet approved or cleared by the [place] FDA and  has been authorized for detection and/or diagnosis of SARS-CoV-2 by FDA under an Emergency Use Authorization (EUA). This EUA will remain  in effect (meaning this test can be used) for the duration of the COVID-19 declaration under Se ction 564(b)(1) of the Act, 21 U.S.C. section 360bbb-3(b)(1), unless the authorization is terminated or revoked sooner.  Performed at [facility], [place] [address] [place], [place] [address]          Radiology Studies: No results found.      Scheduled Meds: . apixaban  5 mg Oral BID  . vitamin C  500 mg Oral BID  . calcitRIOL  0.25 mcg Oral q12n4p  . calcium carbonate  1 tablet Oral TID  . docusate sodium  100 mg Oral BID  . DULoxetine  60 mg Oral Daily  . feeding supplement  237 mL Oral TID BM  . folic acid  1 mg Oral Daily  . levothyroxine  137 mcg  Oral Q0600  . magnesium oxide  400 mg Oral Daily  . methylPREDNISolone (SOLU-MEDROL) injection  125 mg Intravenous Once  . mirtazapine  7.5 mg Oral QHS  . montelukast  10 mg Oral QHS  . multivitamin with minerals  1 tablet Oral BID  . selenium  200 mcg Oral Daily  . zinc sulfate  220 mg Oral Daily   Continuous Infusions: . dextrose 50 mL/hr at 04/28/20 1217          Aline August, MD [facility] 04/29/2020, 7:16 AM

## 2020-04-29 NOTE — Progress Notes (Signed)
VASCULAR LAB    Right upper extremity venous duplex has been performed.  See CV proc for preliminary results.   Osama Coleson, RVT 04/29/2020, 2:45 PM

## 2020-04-30 ENCOUNTER — Other Ambulatory Visit (HOSPITAL_COMMUNITY): Payer: Medicare Other

## 2020-04-30 DIAGNOSIS — E162 Hypoglycemia, unspecified: Secondary | ICD-10-CM | POA: Diagnosis not present

## 2020-04-30 DIAGNOSIS — I482 Chronic atrial fibrillation, unspecified: Secondary | ICD-10-CM | POA: Diagnosis not present

## 2020-04-30 DIAGNOSIS — I10 Essential (primary) hypertension: Secondary | ICD-10-CM | POA: Diagnosis not present

## 2020-04-30 DIAGNOSIS — N1831 Chronic kidney disease, stage 3a: Secondary | ICD-10-CM | POA: Diagnosis not present

## 2020-04-30 LAB — POCT CBG MONITORING
Blood Glucose, Fingerstick: 90
Blood Glucose, Fingerstick: 76
Blood Glucose, Fingerstick: 85
Blood Glucose, Fingerstick: 71
Blood Glucose, Fingerstick: 119
CBG: 76
Glucose, fingerstick: 76 (ref 70–99)

## 2020-04-30 LAB — GLUCOSE, CAPILLARY: Glucose-Capillary: 62 mg/dL — ABNORMAL LOW (ref 70–99)

## 2020-04-30 LAB — VITAMIN A: Vitamin A (Retinoic Acid): 7.8 ug/dL — ABNORMAL LOW (ref 22.0–69.5)

## 2020-04-30 MED ORDER — VITAMIN A 3 MG (10000 UNIT) PO CAPS
10000.0000 [IU] | ORAL_CAPSULE | Freq: Two times a day (BID) | ORAL | Status: DC
  Administered 2020-04-30 – 2020-05-04 (×8): 10000 [IU] via ORAL
  Filled 2020-04-30 (×9): qty 1

## 2020-04-30 NOTE — Progress Notes (Signed)
Patient ID: Jillian Eaton, female   DOB: Jun 26, 1950, 70 y.o.   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 On presentation, BNP was 167 and troponin was 4 and patient was found to have leg edema.  Cardiology was consulted and patient underwent right heart cath that showed no evidence of pulmonary hypertension and no increase left sided filling pressures.  Subsequently PCCM was consulted who also subsequently signed off.  Patient has had episodes of hypoglycemia requiring D10 infusion.  Assessment & Plan:   Acute chest pain with shortness of breath/hypoxic respiratory failure/pulmonary hypertension and imaging/chronic diastolic heart failure -Patient had outpatient CT PE study which showed no PE but showed evidence of pulmonary hypertension and small bilateral pleural effusion -Cardiology consulted: 2D echo showed EF of 55 to 60% -Her PFT from 03/01/2019 showed obstructive lung disease -s/p right heart cath 04/23/20-that showed no evidence of pulmonary hypertension and no increased left-sided filling pressure-shortness of breath hypoxia not explained by congestive heart failure - She was seen by pulmonary: Pulmonary is of the opinion that her symptoms could be due to asthma/possible vocal cord dysfunction. pulmonary has advised outpatient follow-up and also ENT evaluation as outpatient.  -Respiratory status  has currently much improved and currently on room air  Hypoglycemia -Patient reports having recent hypoglycemia in outpatient blood work as outpatient.  Has been hypoglycemic here needing D10 infusion. ?  Etiology likely in the setting of gastric bypass, has not had great appetite/and seems particular about food.  Dietitian consulted. -cosyntropin test NEGATIVE -Prior hospitalist discussed with her endocrinologist/ Dr. Cruzita Lederer: she reported that the new Cortisol cut off is 14 and cosyntropin test is negative. C peptide BHOB, SFU level, insulin, proinsulin, insulin antibody  were ordered. - Her C-peptide was only slightly elevated.  Insulin/proinsulin still pending.  Case was discussed again with Dr. Cruzita Lederer by prior hospitalist who had recommended repeating C-peptide, proinsulin and insulin labs once again but recommended to draw the labs when blood sugar is less than 50.  -Blood sugar slightly improving.  Currently on D5 at 50 cc an hour.  She received a dose of IV Solu-Medrol again on 04/29/2020 as per recommendations from Dr. Cruzita Lederer with whom I had communicated with on 04/29/2020.  Continue to encourage frequent meals -DC D5 today and monitor  Chronic A. Fib Complete heart block Status post AV nodal ablation and [employer] pacemaker placement in 4/21 -Continue Eliquis.  Not on AV nodal blocker  Hypotension -Resolved with IV fluids.  Blood pressure on the lower side.  Unintentional weight loss for past several months -TSH stable.  Mammogram in December 2021 was normal -Has follow-up with GI as an outpatient for upper and lower endoscopy to rule out GI malignancy -Follow nutrition recommendations  History of thyroid cancer/postsurgical hypothyroidism and hypocalcemia/osteoporosis -Continue Synthroid and calcium supplementation  Mild intermittent asthma -Stable.  Outpatient follow-up with pulmonary  Chronic renal disease stage IIIa -Renal function stable.  Hypomagnesemia -Continue  supplementation  Insomnia -Continue Lunesta  DVT prophylaxis: Eliquis Code Status: Full Family Communication: None at bedside Disposition  Plan: Status is: Inpatient  Remains inpatient appropriate because:Inpatient level of care appropriate due to severity of illness   Dispo: The patient is from: Home              Anticipated d/c is to: Home in 1 to 2 days once clinically improved              Patient currently is not medically stable to d/c.   Difficult to place patient No  Consultants: Cardiology/PCCM.  Phone communication with endocrinology/Dr. Cruzita Lederer by prior hospitalist Procedures: None  Antimicrobials: None   Subjective: Patient seen and examined at bedside.  Denies worsening shortness breath, chest pain, fever, vomiting  objective: Vitals:   04/29/20 0608 04/29/20 2016 04/30/20 0500 04/30/20 0543  BP: 95/61 99/73  96/67  Pulse: 65 62  63  Resp: 18 18  18   Temp: 98.4 F (36.9 C) 98.6 F (37 C)  98.6 F (37 C)  TempSrc: Oral Oral  Oral  SpO2: 98% 100%  100%  Weight:   78.7 kg     Intake/Output Summary (Last 24 hours) at 04/30/2020 0716 Last data filed at 04/29/2020 1700 Gross per 24 hour  Intake 1360 ml  Output --  Net 1360 ml   Filed Weights   04/22/20 1922 04/28/20 1519 04/30/20 0500  Weight: 78.6 kg 78.7 kg 78.7 kg    Examination:  General exam: No distress.  Currently on room air. respiratory system: Bilateral decreased breath sounds at bases with some crackles cardiovascular system: S1-S2 heard, rate controlled  gastrointestinal system: Abdomen is nondistended, soft and nontender.  Normal bowel sounds heard  extremities: No cyanosis; trace lower extremity edema present   Data Reviewed: I have personally reviewed following labs and imaging studies  CBC: Recent Labs  Lab 04/23/20 1150 04/23/20 1201 04/24/20 0509 04/25/20 0049 04/29/20 0028  WBC  --   --  4.5 4.1 4.3  NEUTROABS  --   --   --   --  2.4  HGB 9.2* 9.2*  9.5* 8.9* 8.2* 7.9*   HCT 27.0* 27.0*  28.0* 27.6* 24.7* 23.5*  MCV  --   --  96.5 95.7 95.5  PLT  --   --  145* 144* 284   Basic Metabolic Panel: Recent Labs  Lab 04/23/20 1201 04/24/20 0509 04/25/20 0049 04/26/20 0207 04/29/20 0028  NA 140  140 136 137 139 137  K 4.1  4.1 3.7 4.0 4.5 4.6  CL  --  108 109 109 106  CO2  --  21* 22 22 27   GLUCOSE  --  124* 75 68* 81  BUN  --  22 26* 25* 28*  CREATININE  --  1.15* 1.07* 1.10* 1.10*  CALCIUM  --  6.9* 7.0* 7.5* 7.8*  MG  --   --   --   --  1.7   GFR: Estimated Creatinine Clearance: 53.6 mL/min (A) (by C-G formula based on SCr of 1.1 mg/dL (H)). Liver Function Tests: Recent Labs  Lab 04/24/20 0509 04/25/20 0049  AST 22 21  ALT 25 24  ALKPHOS 61 54  BILITOT 0.6 0.5  PROT 4.5* 4.2*  ALBUMIN 1.9* 1.8*   No results for input(s): LIPASE, AMYLASE in the last 168 hours. No results for input(s): AMMONIA in the last 168 hours. Coagulation Profile: No results for input(s): INR, PROTIME in the last 168 hours. Cardiac Enzymes: No results for input(s): CKTOTAL, CKMB, CKMBINDEX, TROPONINI in the last 168 hours. BNP (last 3 results) Recent Labs  06/13/19 1429 04/02/20 0941  PROBNP 251.0* 186.0*   HbA1C: No results for input(s): HGBA1C in the last 72 hours. CBG: Recent Labs  Lab 04/27/20 0350 04/27/20 0736 04/27/20 0832 04/27/20 1113 04/27/20 1410  GLUCAP 58* 37* 58* 59* 63*   Lipid Profile: No results for input(s): CHOL, HDL, LDLCALC, TRIG, CHOLHDL, LDLDIRECT in the last 72 hours. Thyroid Function Tests: No results for input(s): TSH, T4TOTAL, FREET4, T3FREE, THYROIDAB in the last 72 hours. Anemia Panel: No results for input(s): VITAMINB12, FOLATE, FERRITIN, TIBC, IRON, RETICCTPCT in the last 72 hours. Sepsis Labs: No results for input(s): PROCALCITON, LATICACIDVEN in the last 168 hours.  Recent Results (from the past 240 hour(s))  SARS CORONAVIRUS 2 (TAT 6-24 HRS) Nasopharyngeal Nasopharyngeal Swab     Status: None   Collection  Time: 04/22/20  2:00 AM   Specimen: Nasopharyngeal Swab  Result Value Ref Range Status   SARS Coronavirus 2 NEGATIVE NEGATIVE Final    Comment: (NOTE) SARS-CoV-2 target nucleic acids are NOT DETECTED.  The SARS-CoV-2 RNA is generally detectable in upper and lower respiratory specimens during the acute phase of infection. Negative results do not preclude SARS-CoV-2 infection, do not rule out co-infections with other pathogens, and should not be used as the sole basis for treatment or other patient management decisions. Negative results must be combined with clinical observations, patient history, and epidemiological information. The expected result is Negative.  Fact Sheet for Patients: SugarRoll.be  Fact Sheet for Healthcare Providers: https://www.woods-mathews.com/  This test is not yet approved or cleared by the [place] FDA and  has been authorized for detection and/or diagnosis of SARS-CoV-2 by FDA under an Emergency Use Authorization (EUA). This EUA will remain  in effect (meaning this test can be used) for the duration of the COVID-19 declaration under Se ction 564(b)(1) of the Act, 21 U.S.C. section 360bbb-3(b)(1), unless the authorization is terminated or revoked sooner.  Performed at [facility], [place] [address], [place], [place] [address]          Radiology Studies: VAS [place] UPPER EXTREMITY VENOUS DUPLEX  Result Date: 04/29/2020 UPPER VENOUS STUDY  Indications: Edema, and IV infiltration Limitations: Edema. Comparison Study: No prior study on file Performing Technologist: Sharion Dove RVS  Examination Guidelines: A complete evaluation includes B-mode imaging, spectral Doppler, color Doppler, and power Doppler as needed of all accessible portions of each vessel. Bilateral testing is considered an integral part of a complete examination. Limited examinations for reoccurring indications may be performed as noted.   Right Findings: +----------+------------+---------+-----------+----------+-------------------+ RIGHT     CompressiblePhasicitySpontaneousProperties      Summary       +----------+------------+---------+-----------+----------+-------------------+ IJV           Full       Yes       Yes                                  +----------+------------+---------+-----------+----------+-------------------+ Subclavian               Yes       Yes                                  +----------+------------+---------+-----------+----------+-------------------+ Axillary                 Yes       Yes                                  +----------+------------+---------+-----------+----------+-------------------+  Brachial      Full       Yes       Yes                                  +----------+------------+---------+-----------+----------+-------------------+ Radial        Full                                                      +----------+------------+---------+-----------+----------+-------------------+ Ulnar                                               Not well visualized +----------+------------+---------+-----------+----------+-------------------+ Cephalic      None                                         Acute        +----------+------------+---------+-----------+----------+-------------------+ Basilic       Full                                                      +----------+------------+---------+-----------+----------+-------------------+  Left Findings: +----------+------------+---------+-----------+----------+-------+ LEFT      CompressiblePhasicitySpontaneousPropertiesSummary +----------+------------+---------+-----------+----------+-------+ Subclavian               Yes       Yes                      +----------+------------+---------+-----------+----------+-------+  Summary:  Right: Findings consistent with acute superficial vein thrombosis  involving the right cephalic vein.  Left: No evidence of thrombosis in the subclavian.  *See table(s) above for measurements and observations.  Diagnosing physician: Jamelle Haring Electronically signed by Jamelle Haring on 04/29/2020 at 3:42:30 PM.    Final         Scheduled Meds: . apixaban  5 mg Oral BID  . vitamin C  500 mg Oral BID  . calcitRIOL  0.25 mcg Oral q12n4p  . calcium carbonate  1 tablet Oral TID WC  . docusate sodium  100 mg Oral BID  . DULoxetine  60 mg Oral Daily  . feeding supplement  237 mL Oral TID BM  . folic acid  1 mg Oral Daily  . levothyroxine  137 mcg Oral Q0600  . magnesium oxide  400 mg Oral Daily  . mirtazapine  7.5 mg Oral QHS  . montelukast  10 mg Oral QHS  . multivitamin with minerals  1 tablet Oral BID  . selenium  200 mcg Oral Daily  . zinc sulfate  220 mg Oral Daily   Continuous Infusions: . dextrose 50 mL/hr at 04/29/20 0906          Aline August, MD [facility] 04/30/2020, 7:16 AM

## 2020-04-30 NOTE — Progress Notes (Signed)
Nutrition Follow Up  DOCUMENTATION CODES:   Severe malnutrition in context of chronic illness  INTERVENTION:   Recommend patient follow up with PCP in one month to recheck vitamin/mineral profile.   Patient will need higher dose of vitamin A over the next 14 days given deficiency with concern for Bitot's spots. Once 14 days are finished patient may transition to bariatric MVI BID (should provide 5000-10000 IU Vitamin A).   Replete Vitamin A 25,000 IU for 14 days  Replete selenium 2-3 mcg/kg/day (may transition to bariatric MVI upon d/c)  Replete Vitamin C 500 mg BID  Replete zinc sulfate 220 mg daily (may transition to bariatric MVI upon d/c)  Copper supplementation completed    Ensure Enlive po TID, each supplement provides 350 kcal and 20 grams of protein  MVI with minerals BID  Continue calcium carbonate 500 mg to TID   (MVI and calcium need to be taken at least two hours apart)  NUTRITION DIAGNOSIS:   Severe Malnutrition related to chronic illness (hx RYGB, CHF) as evidenced by severe fat depletion,severe muscle depletion,percent weight loss,energy intake < or equal to 75% for > or equal to 1 month.  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 Presents this admission with CHF exacerbation.   Patient reports intake over the last two days declined slightly. Last night she consumed a [place] sandwich without complication. Reports she was hungry this am and finished 100% of her meal. Ensure intake looks to be off/on. D5 discontinued this am, CBGs since d/c  noted as 90, 76, and 85.   Patient shows Vitamin A deficiency. Recommend supplementation of 25,000 IU daily for 14 days then 10,000 IU daily after. Discussed with patient the importance of following up with PCP to recheck vitamin/mineral profile in 1-2 months.   Admission weight: 78.6 kg Current weight: 78.7 kg   Vitamin/MIneral Profile:  Thiamine B1: pending  Vitamin B12: 639 (wdl) Vitamin A: 7.8 (L) Vitamin D: 54 (wdl) Vitamin C: 0.2 (L)   Copper: 44 (L) Selenium: 87 (L)   Zinc: 45 (low normal)   Medications: vitamin c 500 mg BID, calcitriol BID, TUMS TID, colace, folic acid, 20 mg lasix, mag-ox, solumedrol, remeron, MVI BID, selenium 200 mcg daily, zinc sulfate 220 mg  Labs: CBG 76-167  Diet Order:   Diet Order            Diet regular Room service appropriate? Yes; Fluid consistency: Thin  Diet effective now                 EDUCATION NEEDS:   Not appropriate for education at this time  Skin:  Skin Assessment: Reviewed RN Assessment  Last BM:  3/11  Height:   Ht Readings from Last 1 Encounters:  04/19/20 5\' 7"  (1.702 m)    Weight:   Wt Readings from Last 1 Encounters:  04/30/20 78.7 kg    BMI:  Body mass index is 27.17 kg/m.  Estimated Nutritional Needs:   Kcal:  2000-2200 kcal  Protein:  100-120 grams  Fluid:  >/= 2 L/day  Mariana Single RD, LDN Clinical Nutrition Pager  listed in [place]

## 2020-04-30 NOTE — Progress Notes (Signed)
Blood sugar 69, pt says she will drink an  Ensure. Will recheck in 30 min.  2130 Blood sugar recheck-94, will cont to monitor.  2300 Blood sugar 58- pt says she will only take orange juice with no sugar in it.Will recheck in 30 minutes.  2340 Blood sugar 94, will cont to monitor.

## 2020-05-01 DIAGNOSIS — N1831 Chronic kidney disease, stage 3a: Secondary | ICD-10-CM | POA: Diagnosis not present

## 2020-05-01 DIAGNOSIS — G40909 Epilepsy, unspecified, not intractable, without status epilepticus: Secondary | ICD-10-CM | POA: Diagnosis not present

## 2020-05-01 DIAGNOSIS — E162 Hypoglycemia, unspecified: Secondary | ICD-10-CM | POA: Diagnosis not present

## 2020-05-01 DIAGNOSIS — I10 Essential (primary) hypertension: Secondary | ICD-10-CM | POA: Diagnosis not present

## 2020-05-01 LAB — GLUCOSE, CAPILLARY: Glucose-Capillary: 103 mg/dL — ABNORMAL HIGH (ref 70–99)

## 2020-05-01 LAB — INSULIN ANTIBODIES, BLOOD: Insulin Antibodies, Human: 6.5 uU/mL — ABNORMAL HIGH

## 2020-05-01 NOTE — Progress Notes (Signed)
Blood sugar 75-pt drank 8oz orange juice, will cont to monitor.  0300 Blood sugar 76- pt says she will eat bag of potato chips and drink water.  0457 Blood sugar 64- pt told NT that she wasn't going to eat or drink and that she needed to sleep.Nurse spoke with pt to reiterate importance of taking nourishment to increase blood sugar and was able to encourage pt to drink 3/4 of Ensure Plus. Will cont to monitor

## 2020-05-01 NOTE — Progress Notes (Signed)
Patient ID: Jillian Eaton, female   DOB: May 03, 1950, 70 y.o.   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 On presentation, BNP was 167 and troponin was 4 and patient was found to have leg edema.  Cardiology was consulted and patient underwent right heart cath that showed no evidence of pulmonary hypertension and no increase left sided filling pressures.  Subsequently PCCM was consulted who also subsequently signed off.  Patient has had episodes of hypoglycemia requiring D10 infusion.  Assessment & Plan:   Acute chest pain with shortness of breath/hypoxic respiratory failure/pulmonary hypertension and imaging/chronic diastolic heart failure -Patient had outpatient CT PE study which showed no PE but showed evidence of pulmonary hypertension and small bilateral pleural effusion -Cardiology consulted: 2D echo showed EF of 55 to 60% -Her PFT from 03/01/2019 showed obstructive lung disease -s/p right heart cath 04/23/20-that showed no evidence of pulmonary hypertension and no increased left-sided filling pressure-shortness of breath hypoxia not explained by congestive heart failure - She was seen by pulmonary: Pulmonary is of the opinion that her symptoms could be due to asthma/possible vocal cord dysfunction. pulmonary has advised outpatient follow-up and also ENT evaluation as outpatient.  -Respiratory status  has currently much improved and currently on room air  Hypoglycemia -Patient reports having recent hypoglycemia in outpatient blood work as outpatient.  Has been hypoglycemic here needing D10 infusion. ?  Etiology likely in the setting of gastric bypass, has not had great appetite/and seems particular about food.  Dietitian consulted. -cosyntropin test NEGATIVE -Prior hospitalist discussed with her endocrinologist/ Dr. Cruzita Lederer: she reported that the new Cortisol cut off is 14 and cosyntropin test is negative. C peptide BHOB, SFU level, insulin, proinsulin, insulin antibody  were ordered. - Her C-peptide was only slightly elevated.  Insulin/proinsulin still pending.  Case was discussed again with Dr. Cruzita Lederer by prior hospitalist who had recommended repeating C-peptide, proinsulin and insulin labs once again but recommended to draw the labs when blood sugar is less than 50.  -Blood sugar slightly improving.  Currently on D5 at 50 cc an hour.  She received a dose of IV Solu-Medrol again on 04/29/2020 as per recommendations from Dr. Cruzita Lederer with whom I had communicated with on 04/29/2020.  Continue to encourage frequent meals -D5 discontinued on 04/30/2020.  Blood sugars dropped to the 50s and 60s overnight.  Continue to encourage oral intake and frequent meals/Ensure.  Continue to monitor blood sugars.  Chronic A. Fib Complete heart block Status post AV nodal ablation and Medtronic pacemaker placement in 4/21 -Continue Eliquis.  Not on AV nodal blocker  Hypotension -Resolved with IV fluids.  Blood pressure on the lower side.  Unintentional weight loss for past several months -TSH stable.  Mammogram in December 2021 was normal -Has follow-up with GI as an outpatient for upper and lower endoscopy to rule out GI malignancy -Follow nutrition recommendations  History of thyroid cancer/postsurgical hypothyroidism and hypocalcemia/osteoporosis -Continue Synthroid and calcium supplementation  Mild  intermittent asthma -Stable.  Outpatient follow-up with pulmonary  Chronic renal disease stage IIIa -  Renal function stable.  Hypomagnesemia -Continue supplementation  Insomnia -Continue Lunesta  DVT prophylaxis: Eliquis Code Status: Full Family Communication: None at bedside Disposition Plan: Status is: Inpatient  Remains inpatient appropriate because:Inpatient level of care appropriate due to severity of illness   Dispo: The patient is from: Home              Anticipated d/c is to: Home in 1 to 2 days once clinically improved              Patient currently is not medically stable to d/c.   Difficult to place patient No  Consultants: Cardiology/PCCM.  Phone communication with endocrinology/Dr. Cruzita Lederer by prior hospitalist Procedures: None  Antimicrobials: None   Subjective: Patient seen and examined at bedside.  Patient had blood sugars dropping into the 50s and 60s overnight.  No overnight fever, vomiting or worsening shortness of breath reported.   Objective: Vitals:   04/30/20 1256 04/30/20 1637 04/30/20 2208 05/01/20 0625  BP: (!) 82/57 (!) 94/58 107/73 (!) 90/58  Pulse: (!) 59 61 62 60  Resp: 18 13 18 16   Temp: 98.4 F (36.9 C) 98.3 F (36.8 C) 98.2 F (36.8 C) 98.8 F (37.1 C)  TempSrc:  Oral Oral Oral  SpO2: 100% 100% 100% 100%  Weight:      Height:  5\' 7"  (1.702 m)      Intake/Output Summary (Last 24 hours) at 05/01/2020 0928 Last data filed at 04/30/2020 1000 Gross per 24 hour  Intake 1077.91 ml  Output --  Net 1077.91 ml   Filed Weights   04/22/20 1922 04/28/20 1519 04/30/20 0500  Weight: 78.6 kg 78.7 kg 78.7 kg    Examination:  General exam: On room air currently.  No acute distress. respiratory system: Decreased breath sounds at bases bilaterally with no wheezing cardiovascular system: Rate controlled, S1-S2 heard gastrointestinal system: Abdomen is nondistended, soft and nontender.  Bowel sounds heard  extremities: Mild lower extremity  edema present; no clubbing   Data Reviewed: I have personally reviewed following labs and imaging studies  CBC: Recent Labs  Lab 04/25/20 0049 04/29/20 0028  WBC 4.1 4.3  NEUTROABS  --  2.4  HGB 8.2* 7.9*  HCT 24.7* 23.5*  MCV 95.7 95.5  PLT 144* 595   Basic Metabolic Panel: Recent Labs  Lab 04/25/20 0049 04/26/20 0207 04/29/20 0028 04/30/20 1200  NA 137 139 137  --   K 4.0 4.5 4.6  --   CL 109 109 106  --   CO2 22 22 27   --   GLUCOSE 75 68* 81 76  BUN 26* 25* 28*  --   CREATININE 1.07* 1.10* 1.10*  --   CALCIUM 7.0* 7.5* 7.8*  --   MG  --   --  1.7  --    GFR: Estimated Creatinine Clearance: 53.6 mL/min (A) (by C-G formula based on SCr of 1.1 mg/dL (H)). Liver Function Tests: Recent Labs  Lab 04/25/20 0049  AST 21  ALT 24  ALKPHOS 54  BILITOT 0.5  PROT 4.2*  ALBUMIN 1.8*   No results for input(s): LIPASE, AMYLASE in the last 168 hours. No results for input(s): AMMONIA in the last 168 hours. Coagulation Profile: No results for input(s): INR, PROTIME in the last 168 hours. Cardiac Enzymes: No results for input(s): CKTOTAL, CKMB, CKMBINDEX, TROPONINI in the last 168 hours. BNP (last 3 results) Recent Labs    06/13/19 1429 04/02/20 0941  PROBNP 251.0* 186.0*   HbA1C: No results for  input(s): HGBA1C in the last 72 hours. CBG: Recent Labs  Lab 04/27/20 0736 04/27/20 0832 04/27/20 1113 04/27/20 1410 04/30/20 2315  GLUCAP 37* 58* 59* 63* 62*   Lipid Profile: No results for input(s): CHOL, HDL, LDLCALC, TRIG, CHOLHDL, LDLDIRECT in the last 72 hours. Thyroid Function Tests: No results for input(s): TSH, T4TOTAL, FREET4, T3FREE, THYROIDAB in the last 72 hours. Anemia Panel: No results for input(s): VITAMINB12, FOLATE, FERRITIN, TIBC, IRON, RETICCTPCT in the last 72 hours. Sepsis Labs: No results for input(s): PROCALCITON, LATICACIDVEN in the last 168 hours.  Recent Results (from the past 240 hour(s))  SARS CORONAVIRUS 2 (TAT 6-24 HRS)  Nasopharyngeal Nasopharyngeal Swab     Status: None   Collection Time: 04/22/20  2:00 AM   Specimen: Nasopharyngeal Swab  Result Value Ref Range Status   SARS Coronavirus 2 NEGATIVE NEGATIVE Final    Comment: (NOTE) SARS-CoV-2 target nucleic acids are NOT DETECTED.  The SARS-CoV-2 RNA is generally detectable in upper and lower respiratory specimens during the acute phase of infection. Negative results do not preclude SARS-CoV-2 infection, do not rule out co-infections with other pathogens, and should not be used as the sole basis for treatment or other patient management decisions. Negative results must be combined with clinical observations, patient history, and epidemiological information. The expected result is Negative.  Fact Sheet for Patients: SugarRoll.be  Fact Sheet for Healthcare Providers: https://www.woods-mathews.com/  This test is not yet approved or cleared by the [place] FDA and  has been authorized for detection and/or diagnosis of SARS-CoV-2 by FDA under an Emergency Use Authorization (EUA). This EUA will remain  in effect (meaning this test can be used) for the duration of the COVID-19 declaration under Se ction 564(b)(1) of the Act, 21 U.S.C. section 360bbb-3(b)(1), unless the authorization is terminated or revoked sooner.  Performed at [facility], [place] [address] [place], [place] [address]          Radiology Studies: VAS [place] UPPER EXTREMITY VENOUS DUPLEX  Result Date: 04/29/2020 UPPER VENOUS STUDY  Indications: Edema, and IV infiltration Limitations: Edema. Comparison Study: No prior study on file Performing Technologist: Sharion Dove RVS  Examination Guidelines: A complete evaluation includes B-mode imaging, spectral Doppler, color Doppler, and power Doppler as needed of all accessible portions of each vessel. Bilateral testing is considered an integral part of a complete examination. Limited  examinations for reoccurring indications may be performed as noted.  Right Findings: +----------+------------+---------+-----------+----------+-------------------+ RIGHT     CompressiblePhasicitySpontaneousProperties      Summary       +----------+------------+---------+-----------+----------+-------------------+ IJV           Full       Yes       Yes                                  +----------+------------+---------+-----------+----------+-------------------+ Subclavian               Yes       Yes                                  +----------+------------+---------+-----------+----------+-------------------+ Axillary                 Yes       Yes                                  +----------+------------+---------+-----------+----------+-------------------+  Brachial      Full       Yes       Yes                                  +----------+------------+---------+-----------+----------+-------------------+ Radial        Full                                                      +----------+------------+---------+-----------+----------+-------------------+ Ulnar                                               Not well visualized +----------+------------+---------+-----------+----------+-------------------+ Cephalic      None                                         Acute        +----------+------------+---------+-----------+----------+-------------------+ Basilic       Full                                                      +----------+------------+---------+-----------+----------+-------------------+  Left Findings: +----------+------------+---------+-----------+----------+-------+ LEFT      CompressiblePhasicitySpontaneousPropertiesSummary +----------+------------+---------+-----------+----------+-------+ Subclavian               Yes       Yes                      +----------+------------+---------+-----------+----------+-------+  Summary:   Right: Findings consistent with acute superficial vein thrombosis involving the right cephalic vein.  Left: No evidence of thrombosis in the subclavian.  *See table(s) above for measurements and observations.  Diagnosing physician: Jamelle Haring Electronically signed by Jamelle Haring on 04/29/2020 at 3:42:30 PM.    Final         Scheduled Meds: . apixaban  5 mg Oral BID  . vitamin C  500 mg Oral BID  . calcitRIOL  0.25 mcg Oral q12n4p  . calcium carbonate  1 tablet Oral TID WC  . docusate sodium  100 mg Oral BID  . DULoxetine  60 mg Oral Daily  . feeding supplement  237 mL Oral TID BM  . folic acid  1 mg Oral Daily  . levothyroxine  137 mcg Oral Q0600  . magnesium oxide  400 mg Oral Daily  . mirtazapine  7.5 mg Oral QHS  . montelukast  10 mg Oral QHS  . multivitamin with minerals  1 tablet Oral BID  . selenium  200 mcg Oral Daily  . vitamin A  10,000 Units Oral BID  . zinc sulfate  220 mg Oral Daily   Continuous Infusions:         Aline August, MD [facility] 05/01/2020, 9:28 AM

## 2020-05-02 DIAGNOSIS — E162 Hypoglycemia, unspecified: Secondary | ICD-10-CM | POA: Diagnosis not present

## 2020-05-02 DIAGNOSIS — I10 Essential (primary) hypertension: Secondary | ICD-10-CM | POA: Diagnosis not present

## 2020-05-02 DIAGNOSIS — N1831 Chronic kidney disease, stage 3a: Secondary | ICD-10-CM | POA: Diagnosis not present

## 2020-05-02 DIAGNOSIS — I482 Chronic atrial fibrillation, unspecified: Secondary | ICD-10-CM | POA: Diagnosis not present

## 2020-05-02 NOTE — Progress Notes (Signed)
Patient ID: Jillian Eaton, female   DOB: 1950-05-03, 70 y.o.   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 On presentation, BNP was 167 and troponin was 4 and patient was found to have leg edema.  Cardiology was consulted and patient underwent right heart cath that showed no evidence of pulmonary hypertension and no increase left sided filling pressures.  Subsequently PCCM was consulted who also subsequently signed off.  Patient has had episodes of hypoglycemia requiring D10 infusion.  Assessment & Plan:   Acute chest pain with shortness of breath/hypoxic respiratory failure/pulmonary hypertension and imaging/chronic diastolic heart failure -Patient had outpatient CT PE study which showed no PE but showed evidence of pulmonary hypertension and small bilateral pleural effusion -Cardiology consulted: 2D echo showed EF of 55 to 60% -Her PFT from 03/01/2019 showed obstructive lung disease -s/p right heart cath 04/23/20-that showed no evidence of pulmonary hypertension and no increased left-sided filling pressure-shortness of breath hypoxia not explained by congestive heart failure - She was seen by pulmonary: Pulmonary is of the opinion that her symptoms could be due to asthma/possible vocal cord dysfunction. pulmonary has advised outpatient follow-up and also ENT evaluation as outpatient.  -Respiratory status  has currently much improved and currently on room air  Hypoglycemia -Patient reports having recent hypoglycemia in outpatient blood work as outpatient.  Has been hypoglycemic here needing D10 infusion. ?  Etiology likely in the setting of gastric bypass, has not had great appetite/and seems particular about food.  Dietitian consulted. -cosyntropin test NEGATIVE -Prior hospitalist discussed with her endocrinologist/ Dr. Cruzita Lederer: she reported that the new Cortisol cut off is 14 and cosyntropin test is negative. C peptide BHOB, SFU level, insulin, proinsulin, insulin antibody  were ordered. - Her C-peptide was only slightly elevated.  Insulin/proinsulin still pending.  Case was discussed again with Dr. Cruzita Lederer by prior hospitalist who had recommended repeating C-peptide, proinsulin and insulin labs once again but recommended to draw the labs when blood sugar is less than 50.  -Blood sugar slightly improving.  Currently on D5 at 50 cc an hour.  She received a dose of IV Solu-Medrol again on 04/29/2020 as per recommendations from Dr. Cruzita Lederer with whom I had communicated with on 04/29/2020.  Continue to encourage frequent meals -D5 discontinued on 04/30/2020.  Blood sugars still fluctuating and going down to the 60s.  Continue to encourage oral intake.  Continue to monitor CBGs.  Chronic A. Fib Complete heart block Status post AV nodal ablation and Medtronic pacemaker placement in 4/21 -Continue Eliquis.  Not on AV nodal blocker  Hypotension -Resolved with IV fluids.  Blood pressure on the lower side.  Unintentional weight loss for past several months -TSH stable.  Mammogram in December 2021 was normal -Has follow-up with GI as an outpatient for upper and lower endoscopy to rule out GI malignancy -Follow nutrition recommendations  History of thyroid cancer/postsurgical hypothyroidism and hypocalcemia/osteoporosis -Continue Synthroid and calcium supplementation  Mild intermittent asthma -Stable.   Outpatient follow-up with pulmonary  Chronic renal disease stage IIIa -Renal function stable.  Monitor intermittently.  Hypomagnesemia -Continue supplementation  Insomnia -Continue Lunesta  DVT prophylaxis: Eliquis Code Status: Full Family Communication: None at bedside Disposition Plan: Status is: Inpatient  Remains inpatient appropriate because:Inpatient level of care appropriate due to severity of illness   Dispo: The patient is from: Home              Anticipated d/c is to: Home in 1 to 2 days once clinically improved              Patient currently is not medically stable to d/c.   Difficult to place patient No  Consultants: Cardiology/PCCM. Communication with endocrinology/Dr. Cruzita Lederer  Procedures: None  Antimicrobials: None   Subjective: Patient seen and examined at bedside.  No worsening shortness of breath, fever, vomiting or chest pain.   Objective: Vitals:   05/01/20 0625 05/01/20 1329 05/01/20 2108 05/02/20 0530  BP: (!) 90/58 101/68 104/71 90/63  Pulse: 60 (!) 59 62 60  Resp: 16 15    Temp: 98.8 F (37.1 C) 98.1 F (36.7 C) 98.3 F (36.8 C) 98.6 F (37 C)  TempSrc: Oral  Oral Oral  SpO2: 100% 100% 100% 98%  Weight:      Height:        Intake/Output Summary (Last 24 hours) at 05/02/2020 0901 Last data filed at 05/02/2020 0531 Gross per 24 hour  Intake -  Output 1800 ml  Net -1800 ml   Filed Weights   04/22/20 1922 04/28/20 1519 04/30/20 0500  Weight: 78.6 kg 78.7 kg 78.7 kg    Examination:  General exam: No distress.  Currently on room air. respiratory system: Decreased breath sounds at bases with some scattered crackles cardiovascular system: S1-S2 heard, rate controlled  gastrointestinal system: Abdomen is nondistended, soft and nontender.  Bowel sounds are heard  extremities: No cyanosis; trace lower extremity edema present  Data Reviewed: I have personally reviewed following labs and imaging studies  CBC: Recent Labs  Lab  04/29/20 0028  WBC 4.3  NEUTROABS 2.4  HGB 7.9*  HCT 23.5*  MCV 95.5  PLT 191   Basic Metabolic Panel: Recent Labs  Lab 04/26/20 0207 04/29/20 0028 04/30/20 1200  NA 139 137  --   K 4.5 4.6  --   CL 109 106  --   CO2 22 27  --   GLUCOSE 68* 81 76  BUN 25* 28*  --   CREATININE 1.10* 1.10*  --   CALCIUM 7.5* 7.8*  --   MG  --  1.7  --    GFR: Estimated Creatinine Clearance: 53.6 mL/min (A) (by C-G formula based on SCr of 1.1 mg/dL (H)). Liver Function Tests: No results for input(s): AST, ALT, ALKPHOS, BILITOT, PROT, ALBUMIN in the last 168 hours. No results for input(s): LIPASE, AMYLASE in the last 168 hours. No results for input(s): AMMONIA in the last 168 hours. Coagulation Profile: No results for input(s): INR, PROTIME in the last 168 hours. Cardiac Enzymes: No results for input(s): CKTOTAL, CKMB, CKMBINDEX, TROPONINI in the last 168 hours. BNP (last 3 results) Recent Labs    06/13/19 1429 04/02/20 0941  PROBNP 251.0* 186.0*   HbA1C: No results for input(s): HGBA1C in the last 72 hours. CBG: Recent Labs  Lab 04/27/20 0832 04/27/20 1113 04/27/20 1410 04/30/20 2315 05/01/20 1432  GLUCAP 58* 59* 63* 62* 103*   Lipid Profile: No results for input(s): CHOL, HDL, LDLCALC, TRIG, CHOLHDL, LDLDIRECT in the last 72 hours. Thyroid Function Tests: No results for input(s): TSH,  T4TOTAL, FREET4, T3FREE, THYROIDAB in the last 72 hours. Anemia Panel: No results for input(s): VITAMINB12, FOLATE, FERRITIN, TIBC, IRON, RETICCTPCT in the last 72 hours. Sepsis Labs: No results for input(s): PROCALCITON, LATICACIDVEN in the last 168 hours.  No results found for this or any previous visit (from the past 240 hour(s)).       Radiology Studies: No results found.      Scheduled Meds: . apixaban  5 mg Oral BID  . vitamin C  500 mg Oral BID  . calcitRIOL  0.25 mcg Oral q12n4p  . calcium carbonate  1 tablet Oral TID WC  . docusate sodium  100 mg Oral BID  .  DULoxetine  60 mg Oral Daily  . feeding supplement  237 mL Oral TID BM  . folic acid  1 mg Oral Daily  . levothyroxine  137 mcg Oral Q0600  . magnesium oxide  400 mg Oral Daily  . mirtazapine  7.5 mg Oral QHS  . montelukast  10 mg Oral QHS  . multivitamin with minerals  1 tablet Oral BID  . selenium  200 mcg Oral Daily  . vitamin A  10,000 Units Oral BID  . zinc sulfate  220 mg Oral Daily   Continuous Infusions:         Aline August, MD [facility] 05/02/2020, 9:01 AM

## 2020-05-02 NOTE — Progress Notes (Signed)
Pts glucose have been as followed today   0710-60 0730-70 0924-97  1104-71 1325-94 1404-77 1706-102

## 2020-05-03 ENCOUNTER — Encounter: Payer: Self-pay | Admitting: Internal Medicine

## 2020-05-03 ENCOUNTER — Inpatient Hospital Stay: Payer: Medicare Other

## 2020-05-03 ENCOUNTER — Inpatient Hospital Stay: Payer: Medicare Other | Admitting: Oncology

## 2020-05-03 DIAGNOSIS — I5042 Chronic combined systolic (congestive) and diastolic (congestive) heart failure: Secondary | ICD-10-CM | POA: Diagnosis not present

## 2020-05-03 DIAGNOSIS — N1831 Chronic kidney disease, stage 3a: Secondary | ICD-10-CM | POA: Diagnosis not present

## 2020-05-03 DIAGNOSIS — E162 Hypoglycemia, unspecified: Secondary | ICD-10-CM | POA: Diagnosis not present

## 2020-05-03 LAB — GLUCOSE, CAPILLARY: Glucose-Capillary: 51 mg/dL — ABNORMAL LOW (ref 70–99)

## 2020-05-03 LAB — VITAMIN B1: Vitamin B1 (Thiamine): 129.9 nmol/L (ref 66.5–200.0)

## 2020-05-03 NOTE — Progress Notes (Signed)
Patients blood sugars for today have been:  0800-65 1000- 102 1200-74 1400-90 1620-89  Will continue to monitor.

## 2020-05-03 NOTE — Progress Notes (Signed)
Patient ID: Jillian Eaton, female   DOB: [birth year], 70 y.o.   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 On presentation, BNP was 167 and troponin was 4 and patient was found to have leg edema.  Cardiology was consulted and patient underwent right heart cath that showed no evidence of pulmonary hypertension and no increase left sided filling pressures.  Subsequently PCCM was consulted who also subsequently signed off.  Patient has had episodes of hypoglycemia requiring dextrose infusion.  Assessment & Plan:   Acute chest pain with shortness of breath/hypoxic respiratory failure/pulmonary hypertension and imaging/chronic diastolic heart failure -Patient had outpatient CT PE study which showed no PE but showed evidence of pulmonary hypertension and small bilateral pleural effusion -Cardiology consulted: 2D echo showed EF of 55 to 60% -Her PFT from 03/01/2019 showed obstructive lung disease -s/p right heart cath 04/23/20-that showed no evidence of pulmonary hypertension and no increased left-sided filling pressure-shortness of breath hypoxia not explained by congestive heart failure - She was seen by pulmonary: Pulmonary is of the opinion that her symptoms could be due to asthma/possible vocal cord dysfunction. pulmonary has advised outpatient follow-up and also ENT evaluation as outpatient.  -Respiratory  status has currently much improved and currently on room air  Hypoglycemia -Patient reports having recent hypoglycemia in outpatient blood work as outpatient.  Has been hypoglycemic here needing D10 infusion. ?  Etiology likely in the setting of gastric bypass, has not had great appetite/and seems particular about food.  Dietitian consulted. -cosyntropin test NEGATIVE -Prior hospitalist discussed with her endocrinologist/ Dr. Cruzita Lederer: she reported that the new Cortisol cut off is 14 and cosyntropin test is negative. C peptide BHOB, SFU level, insulin, proinsulin, insulin antibody  were ordered. - Her C-peptide was only slightly elevated.  Insulin/proinsulin still pending.  Case was discussed again with Dr. Cruzita Lederer by prior hospitalist who had recommended repeating C-peptide, proinsulin and insulin labs once again but recommended to draw the labs when blood sugar is less than 50.  -She received a dose of IV Solu-Medrol again on 04/29/2020 as per recommendations from Dr. Cruzita Lederer with whom I had communicated with on 04/29/2020.  Continue to encourage frequent meals -D5 discontinued on 04/30/2020.  Blood sugars still fluctuating and going down to the 60s and 50s.  Blood sugar went down to the 50s again this morning and hence not stable for discharge.  Continue to encourage oral intake.  Continue to monitor CBGs.  Chronic A. Fib Complete heart block Status post AV nodal ablation and [employer] pacemaker placement in 4/21 -Continue Eliquis.  Not on AV nodal blocker  Hypotension -Resolved with IV fluids.  Blood pressure on the lower side.  Unintentional weight loss for past several months -TSH stable.  Mammogram in December 2021 was normal -Has follow-up with GI as an outpatient for upper and lower endoscopy to rule out GI malignancy -Follow nutrition recommendations  History of thyroid cancer/postsurgical hypothyroidism and hypocalcemia/osteoporosis -Continue Synthroid and calcium  supplementation  Mild intermittent asthma -Stable.  Outpatient follow-up with pulmonary  Chronic renal disease  stage IIIa -Renal function stable.  Monitor intermittently.  Hypomagnesemia -Continue supplementation  Insomnia -Continue Lunesta  DVT prophylaxis: Eliquis Code Status: Full Family Communication: None at bedside Disposition Plan: Status is: Inpatient  Remains inpatient appropriate because:Inpatient level of care appropriate due to severity of illness   Dispo: The patient is from: Home              Anticipated d/c is to: Home in 1 to 2 days once clinically improved              Patient currently is not medically stable to d/c.   Difficult to place patient No  Consultants: Cardiology/PCCM. Communication with endocrinology/Dr. Cruzita Lederer  Procedures: None  Antimicrobials: None   Subjective: Patient seen and examined at bedside.  Denies fever, vomiting, worsening shortness of breath or chest pain.   Objective: Vitals:   05/01/20 2108 05/02/20 0530 05/02/20 1609 05/02/20 2148  BP: 104/71 90/63 (!) 90/57 108/76  Pulse: 62 60 61 60  Resp:   15 16  Temp: 98.3 F (36.8 C) 98.6 F (37 C) 98 F (36.7 C) 98 F (36.7 C)  TempSrc: Oral Oral  Oral  SpO2: 100% 98% 100% 100%  Weight:      Height:        Intake/Output Summary (Last 24 hours) at 05/03/2020 0903 Last data filed at 05/02/2020 1849 Gross per 24 hour  Intake --  Output 550 ml  Net -550 ml   Filed Weights   04/22/20 1922 04/28/20 1519 04/30/20 0500  Weight: 78.6 kg 78.7 kg 78.7 kg    Examination:  General exam: On room air currently.  No acute distress.   Respiratory system: Bilateral decreased breath sounds at bases with scattered crackles  cardiovascular system: Rate controlled, S1-S2 heard gastrointestinal system: Abdomen is nondistended, soft and nontender.  Normal bowel sounds heard  extremities: Mild lower extremity edema present; no clubbing Data Reviewed: I have personally reviewed  following labs and imaging studies  CBC: Recent Labs  Lab 04/29/20 0028  WBC 4.3  NEUTROABS 2.4  HGB 7.9*  HCT 23.5*  MCV 95.5  PLT 562   Basic Metabolic Panel: Recent Labs  Lab 04/29/20 0028 04/30/20 1200  NA 137  --   K 4.6  --   CL 106  --   CO2 27  --   GLUCOSE 81 76  BUN 28*  --   CREATININE 1.10*  --   CALCIUM 7.8*  --   MG 1.7  --    GFR: Estimated Creatinine Clearance: 53.6 mL/min (A) (by C-G formula based on SCr of 1.1 mg/dL (H)). Liver Function Tests: No results for input(s): AST, ALT, ALKPHOS, BILITOT, PROT, ALBUMIN in the last 168 hours. No results for input(s): LIPASE, AMYLASE in the last 168 hours. No results for input(s): AMMONIA in the last 168 hours. Coagulation Profile: No results for input(s): INR, PROTIME in the last 168 hours. Cardiac Enzymes: No results for input(s): CKTOTAL, CKMB, CKMBINDEX, TROPONINI in the last 168 hours. BNP (last 3 results) Recent Labs    06/13/19 1429 04/02/20 0941  PROBNP 251.0* 186.0*   HbA1C: No results for input(s): HGBA1C in the last 72 hours. CBG: Recent Labs  Lab 04/27/20 1113 04/27/20 1410 04/30/20 2315 05/01/20 1432 05/03/20 0526  GLUCAP 59* 63* 62* 103* 51*   Lipid Profile: No results for input(s): CHOL, HDL, LDLCALC, TRIG, CHOLHDL, LDLDIRECT in the last 72 hours. Thyroid Function Tests: No results for input(s): TSH, T4TOTAL, FREET4, T3FREE, THYROIDAB in the  last 72 hours. Anemia Panel: No results for input(s): VITAMINB12, FOLATE, FERRITIN, TIBC, IRON, RETICCTPCT in the last 72 hours. Sepsis Labs: No results for input(s): PROCALCITON, LATICACIDVEN in the last 168 hours.  No results found for this or any previous visit (from the past 240 hour(s)).       Radiology Studies: No results found.      Scheduled Meds: . apixaban  5 mg Oral BID  . vitamin C  500 mg Oral BID  . calcitRIOL  0.25 mcg Oral q12n4p  . calcium carbonate  1 tablet Oral TID WC  . docusate sodium  100 mg Oral BID   . DULoxetine  60 mg Oral Daily  . feeding supplement  237 mL Oral TID BM  . folic acid  1 mg Oral Daily  . levothyroxine  137 mcg Oral Q0600  . magnesium oxide  400 mg Oral Daily  . mirtazapine  7.5 mg Oral QHS  . montelukast  10 mg Oral QHS  . multivitamin with minerals  1 tablet Oral BID  . selenium  200 mcg Oral Daily  . vitamin A  10,000 Units Oral BID  . zinc sulfate  220 mg Oral Daily   Continuous Infusions:         Aline August, MD [facility] 05/03/2020, 9:03 AM

## 2020-05-04 LAB — SULFONYLUREA HYPOGLYCEMICS PANEL, SERUM
Acetohexamide: NEGATIVE ug/mL (ref 20–60)
Chlorpropamide: NEGATIVE ug/mL (ref 75–250)
Glimepiride: NEGATIVE ng/mL (ref 80–250)
Glipizide: NEGATIVE ng/mL (ref 200–1000)
Glyburide: NEGATIVE ng/mL
Nateglinide: NEGATIVE ng/mL
Repaglinide: NEGATIVE ng/mL
Tolazamide: NEGATIVE ug/mL
Tolbutamide: NEGATIVE ug/mL (ref 40–100)

## 2020-05-04 LAB — CBC WITH DIFFERENTIAL/PLATELET
Abs Immature Granulocytes: 0.01 10*3/uL (ref 0.00–0.07)
Basophils Absolute: 0.1 10*3/uL (ref 0.0–0.1)
Basophils Relative: 1 %
Eosinophils Absolute: 0.1 10*3/uL (ref 0.0–0.5)
Eosinophils Relative: 3 %
HCT: 24 % — ABNORMAL LOW (ref 36.0–46.0)
Hemoglobin: 7.9 g/dL — ABNORMAL LOW (ref 12.0–15.0)
Immature Granulocytes: 0 %
Lymphocytes Relative: 25 %
Lymphs Abs: 1.1 10*3/uL (ref 0.7–4.0)
MCH: 32.5 pg (ref 26.0–34.0)
MCHC: 32.9 g/dL (ref 30.0–36.0)
MCV: 98.8 fL (ref 80.0–100.0)
Monocytes Absolute: 0.6 10*3/uL (ref 0.1–1.0)
Monocytes Relative: 13 %
Neutro Abs: 2.4 10*3/uL (ref 1.7–7.7)
Neutrophils Relative %: 58 %
Platelets: 211 10*3/uL (ref 150–400)
RBC: 2.43 MIL/uL — ABNORMAL LOW (ref 3.87–5.11)
RDW: 16.6 % — ABNORMAL HIGH (ref 11.5–15.5)
WBC: 4.3 10*3/uL (ref 4.0–10.5)
nRBC: 0 % (ref 0.0–0.2)

## 2020-05-04 LAB — COMPREHENSIVE METABOLIC PANEL
ALT: 23 U/L (ref 0–44)
AST: 22 U/L (ref 15–41)
Albumin: 1.9 g/dL — ABNORMAL LOW (ref 3.5–5.0)
Alkaline Phosphatase: 75 U/L (ref 38–126)
Anion gap: 4 — ABNORMAL LOW (ref 5–15)
BUN: 32 mg/dL — ABNORMAL HIGH (ref 8–23)
CO2: 29 mmol/L (ref 22–32)
Calcium: 8 mg/dL — ABNORMAL LOW (ref 8.9–10.3)
Chloride: 105 mmol/L (ref 98–111)
Creatinine, Ser: 1.03 mg/dL — ABNORMAL HIGH (ref 0.44–1.00)
GFR, Estimated: 60 mL/min — ABNORMAL LOW (ref >60)
Glucose, Bld: 80 mg/dL (ref 70–99)
Potassium: 3.8 mmol/L (ref 3.5–5.1)
Sodium: 138 mmol/L (ref 135–145)
Total Bilirubin: 0.4 mg/dL (ref 0.3–1.2)
Total Protein: 4.4 g/dL — ABNORMAL LOW (ref 6.5–8.1)

## 2020-05-04 LAB — MAGNESIUM: Magnesium: 1.7 mg/dL (ref 1.7–2.4)

## 2020-05-04 LAB — GLUCOSE, CAPILLARY: Glucose-Capillary: 55 mg/dL — ABNORMAL LOW (ref 70–99)

## 2020-05-04 MED ORDER — CALCITRIOL 0.25 MCG PO CAPS: 0.2500 ug | ORAL_CAPSULE | Freq: Two times a day (BID) | ORAL | Status: DC

## 2020-05-04 MED ORDER — VITAMIN A-BETA CAROTENE 25000 UNITS PO CAPS: 25000.0000 [IU] | ORAL_CAPSULE | Freq: Every day | ORAL | 0 refills | Status: AC

## 2020-05-04 MED ORDER — CALCIUM CARBONATE ANTACID 500 MG PO CHEW: 1.0000 | CHEWABLE_TABLET | Freq: Three times a day (TID) | ORAL | 0 refills | Status: AC

## 2020-05-04 MED ORDER — BLOOD GLUCOSE MONITOR KIT
PACK | 0 refills | Status: DC
Start: 2020-05-04 — End: 2020-07-15

## 2020-05-04 MED ORDER — ASCORBIC ACID 500 MG PO TABS: 500.0000 mg | ORAL_TABLET | Freq: Two times a day (BID) | ORAL | 0 refills | Status: DC

## 2020-05-04 MED ORDER — ADULT MULTIVITAMIN W/MINERALS CH: 1.0000 | ORAL_TABLET | Freq: Two times a day (BID) | ORAL | Status: DC

## 2020-05-04 NOTE — Discharge Summary (Signed)
Physician Discharge Summary  [facility] GYK:[id number] DOB: 1950/08/15 DOA: 04/21/2020  PCP: [name], MD  Admit date: 04/21/2020 Discharge date: 05/04/2020  Admitted From: Home Disposition: Home  Recommendations for Outpatient Follow-up:  1. Follow up with PCP in 1 week with repeat CBC/CMP 2. Outpatient follow-up with endocrinology/Dr. Cruzita Lederer as scheduled 3. Outpatient follow-up with cardiology and pulmonary 4. 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 On presentation, BNP was 167 and troponin was 4 and patient was found to have leg edema.  Cardiology was consulted and patient underwent right heart cath that showed no evidence of pulmonary hypertension and no increase left sided filling pressures.  Subsequently PCCM was consulted who also subsequently signed off.  Patient has had episodes of hypoglycemia requiring dextrose infusion.  Discharge Diagnoses:   Acute chest pain with shortness of breath/hypoxic respiratory failure/pulmonary hypertension and imaging/chronic diastolic heart failure -Patient had outpatient CT PE study which showed no PE but showed evidence of pulmonary hypertension and small bilateral pleural effusion -Cardiology consulted: 2D echo showed EF of 55 to 60% -Her PFT from 03/01/2019 showed obstructive lung disease -s/p  right heart cath 04/23/20-that showed no evidence of pulmonary hypertension and no increased left-sided filling pressure-shortness of breath hypoxia not explained by congestive heart failure -She was seen by pulmonary: Pulmonary is of the opinion that her symptoms could be due to asthma/possible vocal cord dysfunction. pulmonary has advised outpatient follow-up and also ENT evaluation as outpatient.  -Respiratory status has currently much improved and currently on room air -Outpatient follow-up with cardiology and pulmonary  Hypoglycemia -Patient reports having recent hypoglycemia in outpatient blood work as outpatient. Has been hypoglycemic here needing D10 infusion. ? Etiology likely in the setting of gastric bypass, has not had great appetite/and seems particular about food. Dietitian consulted. -cosyntropin test NEGATIVE -Prior hospitalistdiscussed with her endocrinologist/ Dr. Cruzita Lederer: she reported that the new Cortisol cut off is 14 and cosyntropin test is negative. C peptide BHOB, SFU level, insulin, proinsulin, insulin antibody were ordered. -Her C-peptide was only slightly elevated. Insulin/proinsulin still pending.  Case was discussed again with Dr. Cruzita Lederer by prior hospitalist who had recommended repeating C-peptide, proinsulin and insulin labs once again but recommended to draw the labs when blood sugar is less than 50.  -She received a dose of IV Solu-Medrol again on 04/29/2020 as per recommendations from Dr. Cruzita Lederer with whom I had communicated with on 04/29/2020.  Continue to encourage frequent meals -D5 discontinued on 04/30/2020.  Blood sugars still fluctuating and going down to the 60s and 50s.    She has been counseled multiple times regarding frequent small meals and even snacks at night even if it needs that she has to wake up from sleep.  She will be provided with a glucometer on discharge.  Currently tolerating diet.  She is agreeable to discharge home today with outpatient  follow-up with Dr. Cruzita Lederer on 05/06/2020 as scheduled.  Hypoalbuminemia/hypoproteinemia -Abdomen has been persistently low and is 1.9 today.  Encourage patient to increase oral intake including  proteins.  Outpatient follow-up   Chronic A. Fib Complete heart block Status post AV nodal ablation and [employer] pacemaker placement in 4/21 -Continue Eliquis.  Not on AV nodal blocker -Outpatient follow-up with EP  Hypotension -Resolved with IV fluids.  Blood pressure on the lower side.  Unintentional weight loss for past several months -TSH stable.  Mammogram in December 2021 was normal -Has follow-up with GI as an outpatient for upper and lower endoscopy to rule out GI malignancy -Follow nutrition recommendations  Multiple vitamin and mineral deficiencies -Continue supplementation as per nutrition recommendations.  Continue vitamin a supplementation to finish 14-day course of therapy, patient has already received 4 days of treatment.  Continue vitamin C upon discharge.  Patient will also be taking bariatric multivitamin as per nutrition recommendations  History of thyroid cancer/postsurgical hypothyroidism and hypocalcemia/osteoporosis -Continue Synthroid and calcium supplementation  Mild intermittent asthma -Stable.  Outpatient follow-up with pulmonary  Chronic kidney disease stage IIIa -Renal function stable.    Outpatient follow-up.  Hypomagnesemia -Continue supplementation  Insomnia -Continue Lunesta  Discharge Instructions  Discharge Instructions    Amb Referral to Nutrition and Diabetic Education   Complete by: As directed    Ambulatory referral to Cardiology   Complete by: As directed    Hospital follow-up   Ambulatory referral to Pulmonology   Complete by: As directed    Hospital follow-up   Reason for referral: Other   Diet general   Complete by: As directed    Increase activity slowly   Complete by: As directed      Allergies as of 05/04/2020       Reactions   Oxycodone-acetaminophen Itching, Nausea And Vomiting, Rash, Other (See Comments)   Other reaction(s): Unknown   Hydrocodone Itching         Medication List    TAKE these medications   acetaminophen 650 MG CR tablet Commonly known as: TYLENOL Take 1,300 mg by mouth every 8 (eight) hours as needed for pain.   albuterol 108 (90 Base) MCG/ACT inhaler Commonly known as: VENTOLIN HFA Inhale 2 puffs into the lungs every 6 (six) hours as needed for wheezing or shortness of breath.   ascorbic acid 500 MG tablet Commonly known as: VITAMIN C Take 1 tablet (500 mg total) by mouth 2 (two) times daily.   blood glucose meter kit and supplies Kit Dispense based on patient and insurance preference. Use up to four times daily or as needed with symptoms of hypoglycemia.   calcitRIOL 0.25 MCG capsule Commonly known as: ROCALTROL Take 1 capsule (0.25 mcg total) by mouth 2 (two) times daily.   calcium carbonate 500 MG chewable tablet Commonly known as: TUMS - dosed in mg elemental calcium Chew 1 tablet (200 mg of elemental calcium total) by mouth 3 (three) times daily with meals.   cyanocobalamin 1000 MCG/ML injection Commonly known as: (VITAMIN B-12) Inject 1 mL (1,000 mcg total) into the muscle every 30 (thirty) days.   cyclobenzaprine 10 MG tablet Commonly known as: FLEXERIL Take 20 mg by mouth at bedtime.   DSS 100 MG Caps Take 100 mg by mouth daily.   DULoxetine 60 MG capsule Commonly known as: CYMBALTA Take 1 capsule (60 mg total) by mouth daily.   Eliquis 5 MG Tabs tablet Generic drug: apixaban TAKE 1 TABLET TWICE A DAY What changed: how much to take   eszopiclone 2 MG Tabs tablet Commonly known as: LUNESTA Take 1 tablet (2 mg total) by mouth at bedtime.   famotidine 20  MG tablet Commonly known as: PEPCID Take 20 mg by mouth daily as needed for heartburn or indigestion.   FOLIC ACID PO Take 1 tablet by mouth daily.   furosemide 20 MG tablet Commonly known  as: LASIX Take 20 mg by mouth daily as needed for edema.   levothyroxine 137 MCG tablet Commonly known as: SYNTHROID TAKE 1 TABLET (137 MCG TOTAL) BY MOUTH DAILY BEFORE BREAKFAST.   magnesium oxide 400 (241.3 Mg) MG tablet Commonly known as: MAG-OX Take 400 mg by mouth daily.   mirtazapine 7.5 MG tablet Commonly known as: REMERON Take 7.5 mg by mouth at bedtime.   montelukast 10 MG tablet Commonly known as: SINGULAIR TAKE 1 TABLET BY MOUTH EVERYDAY AT BEDTIME What changed: See the new instructions.   multivitamin with minerals Tabs tablet Take 1 tablet by mouth 2 (two) times daily.   potassium chloride 10 MEQ tablet Commonly known as: KLOR-CON Take 10 mEq by mouth daily as needed (low potassium).   traMADol 50 MG tablet Commonly known as: ULTRAM Take 50 mg by mouth 3 (three) times daily as needed for moderate pain.   vitamin A 25000 UNIT capsule Take 1 capsule (25,000 Units total) by mouth daily for 10 days.   Vitamin D (Ergocalciferol) 1.25 MG (50000 UNIT) Caps capsule Commonly known as: DRISDOL Take 1 capsule (50,000 Units total) by mouth every Wednesday.       Follow-up Information    [name], MD. Go on 05/10/2020.   Specialty: Internal Medicine Why: Please attend your hospital discharge appointment with Dr Maia Petties on Monday, 05/10/20, at 5:15pm.  Contact information: [place] [id number] [phone]        Philemon Kingdom, MD Follow up.   Specialty: Internal Medicine Why: Keep scheduled appointment Contact information: [address] [place] [address] [phone]              Allergies  Allergen Reactions  . Oxycodone-Acetaminophen Itching, Nausea And Vomiting, Rash and Other (See Comments)    Other reaction(s): Unknown  . Hydrocodone Itching         Consultations:  Cardiology/pulmonary   Procedures/Studies: DG Chest 2 View  Result Date: 04/21/2020 CLINICAL DATA:   Shortness of breath for 1 day EXAM: CHEST - 2 VIEW COMPARISON:  04/01/2020 FINDINGS: Pacing device is again noted. Cardiac enlargement is again seen and stable. The lungs are clear bilaterally. No acute bony abnormality is seen. Right shoulder replacement is noted. IMPRESSION: No acute abnormality noted.  Stable cardiomegaly is seen. Electronically Signed   By: Inez Catalina M.D.   On: 04/21/2020 16:54   CT ANGIO CHEST PE W OR WO CONTRAST  Result Date: 04/10/2020 CLINICAL DATA:  71 year old female with chest pain and shortness of breath EXAM: CT ANGIOGRAPHY CHEST WITH CONTRAST TECHNIQUE: Multidetector CT imaging of the chest was performed using the standard protocol during bolus administration of intravenous contrast. Multiplanar CT image reconstructions and MIPs were obtained to evaluate the vascular anatomy. CONTRAST:  14m OMNIPAQUE IOHEXOL 350 MG/ML SOLN COMPARISON:  06/14/2019 FINDINGS: Cardiovascular: Heart: Cardiomegaly again demonstrated. No bowing of the interventricular septum. Left chest wall part pacing device with single lead terminating in the interventricular septum. No significant coronary calcifications. No pericardial fluid/thickening. Similar appearance of dilation of the right ventricle greater than left ventricle. Aorta: Aorta not well visualized given the timing of the contrast bolus. Unremarkable course caliber and contour. Pulmonary arteries: The main pulmonary artery measures 3.6 cm. The pulmonary  arteries are somewhat enlarged as they extend toward the periphery of the lungs, compared to the accompanying bronchi. No filling defects within the main pulmonary artery, lobar arteries, segmental or proximal subsegmental pulmonary arteries. Mediastinum/Nodes: No mediastinal adenopathy. Unremarkable appearance of the thoracic esophagus. Unremarkable appearance of the thoracic inlet and thyroid. Lungs/Pleura: Small bilateral pleural effusions. No significant interlobular septal thickening or  evidence of edema. No pneumothorax. No confluent airspace disease. No endotracheal or endobronchial debris. Upper Abdomen: Surgical changes at the GE junction/proximal stomach. Surgical changes of cholecystectomy. No acute finding of the upper abdomen. Musculoskeletal: No acute displaced fracture. Mild degenerative changes of the spine. No bony canal narrowing. Review of the MIP images confirms the above findings. IMPRESSION: Negative for pulmonary emboli. Evidence of pulmonary hypertension. Small bilateral pleural effusions. Electronically Signed   By: Corrie Mckusick D.O.   On: 04/10/2020 10:43   CARDIAC CATHETERIZATION  Result Date: 04/23/2020 Findings: Ao = 88/57 (71) LV = 82/2 RA = 1 RV = 17/1 PA = 21/5 (12) PCW = 6 Fick cardiac output/index = 4.0/2.0 PVR = 1.0 WU Ao sat = 96% PA sat = 62%, 62% Assessment: 1. Very ectatic but widely patent coronary arteries. No significant coronary stenoses. 2. LVEF 60% 3. Normal PA pressures with low left-sided filling pressures Plan/Discussion: Medical therapy. Glori Bickers, MD 12:55 PM   ECHOCARDIOGRAM COMPLETE  Result Date: 04/22/2020    ECHOCARDIOGRAM REPORT   Patient Name:   [facility] Date of Exam: 04/22/2020 Medical Rec #:  [id number]                Height:       67.0 in Accession #:    [id number]               Weight:       171.8 lb Date of Birth:  [birth year]                BSA:          1.896 m Patient Age:    6 years                 BP:           108/76 mmHg Patient Gender: F                        HR:           65 bpm. Exam Location:  Inpatient Procedure: 2D Echo, Cardiac Doppler and Color Doppler Indications:    Shortness of breath  History:        Patient has prior history of Echocardiogram examinations, most                 recent 07/08/2016. Pacemaker, Arrythmias:Atrial Fibrillation,                 Signs/Symptoms:Shortness of Breath; Risk Factors:Hypertension                 and Diabetes.  Sonographer:    Prosser Referring Phys:  [id number] [place]  1. Left ventricular ejection fraction, by estimation, is 55 to 60%. The left ventricle has normal function. The left ventricle has no regional wall motion abnormalities. There is mild concentric left ventricular hypertrophy. Left ventricular diastolic parameters are indeterminate.  2. Right ventricular systolic function is normal. The right ventricular size is mildly enlarged. There is normal pulmonary artery systolic pressure.  3. Left atrial size was  severely dilated.  4. Right atrial size was severely dilated.  5. The mitral valve is grossly normal. Trivial mitral valve regurgitation.  6. Tricuspid valve regurgitation is moderate.  7. The aortic valve is tricuspid. There is mild calcification of the aortic valve. There is mild thickening of the aortic valve. Aortic valve regurgitation is mild. Comparison(s): No significant change from prior study. FINDINGS  Left Ventricle: Left ventricular ejection fraction, by estimation, is 55 to 60%. The left ventricle has normal function. The left ventricle has no regional wall motion abnormalities. The left ventricular internal cavity size was normal in size. There is  mild concentric left ventricular hypertrophy. Abnormal (paradoxical) septal motion, consistent with RV pacemaker. Left ventricular diastolic parameters are indeterminate. Right Ventricle: The right ventricular size is mildly enlarged. Right vetricular wall thickness was not well visualized. Right ventricular systolic function is normal. There is normal pulmonary artery systolic pressure. The tricuspid regurgitant velocity  is 2.51 m/s, and with an assumed right atrial pressure of 3 mmHg, the estimated right ventricular systolic pressure is 58.0 mmHg. Left Atrium: Left atrial size was severely dilated. Right Atrium: Right atrial size was severely dilated. Pericardium: There is no evidence of pericardial effusion. Mitral Valve: The mitral valve is grossly normal. There is  mild thickening of the mitral valve leaflet(s). There is mild calcification of the mitral valve leaflet(s). Mild mitral annular calcification. Trivial mitral valve regurgitation. Tricuspid Valve: The tricuspid valve is normal in structure. Tricuspid valve regurgitation is moderate. Aortic Valve: The aortic valve is tricuspid. There is mild calcification of the aortic valve. There is mild thickening of the aortic valve. Aortic valve regurgitation is mild. Aortic valve mean gradient measures 1.0 mmHg. Aortic valve peak gradient measures 2.2 mmHg. Aortic valve area, by VTI measures 4.16 cm. Pulmonic Valve: The pulmonic valve was normal in structure. Pulmonic valve regurgitation is trivial. Aorta: The aortic root and ascending aorta are structurally normal, with no evidence of dilitation. IAS/Shunts: No atrial level shunt detected by color flow Doppler. Additional Comments: A device lead is visualized.  LEFT VENTRICLE PLAX 2D LVIDd:         3.50 cm     Diastology LVIDs:         2.90 cm     LV e' medial:    11.30 cm/s LV PW:         1.50 cm     LV E/e' medial:  1.5 LV IVS:        1.40 cm     LV e' lateral:   7.72 cm/s LVOT diam:     2.40 cm     LV E/e' lateral: 2.2 LV SV:         57 LV SV Index:   30 LVOT Area:     4.52 cm  LV Volumes (MOD) LV vol d, MOD A2C: 72.7 ml LV vol d, MOD A4C: 75.1 ml LV vol s, MOD A2C: 34.5 ml LV vol s, MOD A4C: 27.0 ml LV SV MOD A2C:     38.2 ml LV SV MOD A4C:     75.1 ml LV SV MOD BP:      46.7 ml RIGHT VENTRICLE RV S prime:     13.90 cm/s TAPSE (M-mode): 1.5 cm LEFT ATRIUM            Index       RIGHT ATRIUM           Index LA diam:      3.90 cm  2.06 cm/m  RA Area:     22.60 cm LA Vol (A4C): 106.0 ml 55.91 ml/m RA Volume:   65.50 ml  34.55 ml/m  AORTIC VALVE                   PULMONIC VALVE AV Area (Vmax):    4.21 cm    PV Vmax:       0.38 m/s AV Area (Vmean):   3.83 cm    PV Peak grad:  0.6 mmHg AV Area (VTI):     4.16 cm AV Vmax:           75.00 cm/s AV Vmean:          53.700  cm/s AV VTI:            0.137 m AV Peak Grad:      2.2 mmHg AV Mean Grad:      1.0 mmHg LVOT Vmax:         69.80 cm/s LVOT Vmean:        45.500 cm/s LVOT VTI:          0.126 m LVOT/AV VTI ratio: 0.92  AORTA Ao Root diam: 3.20 cm Ao Asc diam:  2.90 cm MITRAL VALVE               TRICUSPID VALVE MV Area (PHT): 5.54 cm    TR Peak grad:   25.2 mmHg MV Decel Time: 137 msec    TR Vmax:        251.00 cm/s MV E velocity: 16.70 cm/s MV A velocity: 62.10 cm/s  SHUNTS MV E/A ratio:  0.27        Systemic VTI:  0.13 m                            Systemic Diam: 2.40 cm Gwyndolyn Kaufman MD Electronically signed by Gwyndolyn Kaufman MD Signature Date/Time: 04/22/2020/5:20:59 PM    Final    VAS [place] UPPER EXTREMITY VENOUS DUPLEX  Result Date: 04/29/2020 UPPER VENOUS STUDY  Indications: Edema, and IV infiltration Limitations: Edema. Comparison Study: No prior study on file Performing Technologist: Sharion Dove RVS  Examination Guidelines: A complete evaluation includes B-mode imaging, spectral Doppler, color Doppler, and power Doppler as needed of all accessible portions of each vessel. Bilateral testing is considered an integral part of a complete examination. Limited examinations for reoccurring indications may be performed as noted.  Right Findings: +----------+------------+---------+-----------+----------+-------------------+ RIGHT     CompressiblePhasicitySpontaneousProperties      Summary       +----------+------------+---------+-----------+----------+-------------------+ IJV           Full       Yes       Yes                                  +----------+------------+---------+-----------+----------+-------------------+ Subclavian               Yes       Yes                                  +----------+------------+---------+-----------+----------+-------------------+ Axillary                 Yes       Yes                                   +----------+------------+---------+-----------+----------+-------------------+  Brachial      Full       Yes       Yes                                  +----------+------------+---------+-----------+----------+-------------------+ Radial        Full                                                      +----------+------------+---------+-----------+----------+-------------------+ Ulnar                                               Not well visualized +----------+------------+---------+-----------+----------+-------------------+ Cephalic      None                                         Acute        +----------+------------+---------+-----------+----------+-------------------+ Basilic       Full                                                      +----------+------------+---------+-----------+----------+-------------------+  Left Findings: +----------+------------+---------+-----------+----------+-------+ LEFT      CompressiblePhasicitySpontaneousPropertiesSummary +----------+------------+---------+-----------+----------+-------+ Subclavian               Yes       Yes                      +----------+------------+---------+-----------+----------+-------+  Summary:  Right: Findings consistent with acute superficial vein thrombosis involving the right cephalic vein.  Left: No evidence of thrombosis in the subclavian.  *See table(s) above for measurements and observations.  Diagnosing physician: Jamelle Haring Electronically signed by Jamelle Haring on 04/29/2020 at 3:42:30 PM.    Final        Subjective: Patient seen and examined at bedside.  Denies worsening shortness of breath, cough or fever.  Had episodes of low blood sugars overnight again.  Feels okay going home today.  Discharge Exam: Vitals:   05/02/20 2148 05/03/20 1516  BP: 108/76 107/76  Pulse: 60 63  Resp: 16 12  Temp: 98 F (36.7 C)   SpO2: 100% 100%    General: Pt is alert, awake, not in  acute distress Cardiovascular: rate controlled, S1/S2 + Respiratory: bilateral decreased breath sounds at bases Abdominal: Soft, NT, ND, bowel sounds + Extremities: Trace lower extremity edema; no cyanosis    The results of significant diagnostics from this hospitalization (including imaging, microbiology, ancillary and laboratory) are listed below for reference.     Microbiology: No results found for this or any previous visit (from the past 240 hour(s)).   Labs: BNP (last 3 results) Recent Labs    06/14/19 1430 04/21/20 1556  BNP 141.0* 416.6*   Basic Metabolic Panel: Recent Labs  Lab 04/29/20 0028 04/30/20 1200 05/04/20 0321  NA 137  --  138  K 4.6  --  3.8  CL 106  --  105  CO2 27  --  29  GLUCOSE 81 76 80  BUN 28*  --  32*  CREATININE 1.10*  --  1.03*  CALCIUM 7.8*  --  8.0*  MG 1.7  --  1.7   Liver Function Tests: Recent Labs  Lab 05/04/20 0321  AST 22  ALT 23  ALKPHOS 75  BILITOT 0.4  PROT 4.4*  ALBUMIN 1.9*   No results for input(s): LIPASE, AMYLASE in the last 168 hours. No results for input(s): AMMONIA in the last 168 hours. CBC: Recent Labs  Lab 04/29/20 0028 05/04/20 0321  WBC 4.3 4.3  NEUTROABS 2.4 2.4  HGB 7.9* 7.9*  HCT 23.5* 24.0*  MCV 95.5 98.8  PLT 167 211   Cardiac Enzymes: No results for input(s): CKTOTAL, CKMB, CKMBINDEX, TROPONINI in the last 168 hours. BNP: Invalid input(s): POCBNP CBG: Recent Labs  Lab 04/27/20 1410 04/30/20 2315 05/01/20 1432 05/03/20 0526 05/04/20 0406  GLUCAP 63* 62* 103* 51* 55*   D-Dimer No results for input(s): DDIMER in the last 72 hours. Hgb A1c No results for input(s): HGBA1C in the last 72 hours. Lipid Profile No results for input(s): CHOL, HDL, LDLCALC, TRIG, CHOLHDL, LDLDIRECT in the last 72 hours. Thyroid function studies No results for input(s): TSH, T4TOTAL, T3FREE, THYROIDAB in the last 72 hours.  Invalid input(s): FREET3 Anemia work up No results for input(s): VITAMINB12,  FOLATE, FERRITIN, TIBC, IRON, RETICCTPCT in the last 72 hours. Urinalysis    Component Value Date/Time   COLORURINE YELLOW 01/03/2019 2248   APPEARANCEUR HAZY (A) 01/03/2019 2248   LABSPEC 1.026 01/03/2019 2248   PHURINE 5.0 01/03/2019 2248   GLUCOSEU NEGATIVE 01/03/2019 2248   HGBUR NEGATIVE 01/03/2019 2248   BILIRUBINUR NEGATIVE 01/03/2019 2248   KETONESUR 5 (A) 01/03/2019 2248   PROTEINUR NEGATIVE 01/03/2019 2248   UROBILINOGEN 1.0 06/06/2013 2154   NITRITE NEGATIVE 01/03/2019 2248   LEUKOCYTESUR TRACE (A) 01/03/2019 2248   Sepsis Labs Invalid input(s): PROCALCITONIN,  WBC,  LACTICIDVEN Microbiology No results found for this or any previous visit (from the past 240 hour(s)).   Time coordinating discharge: 35 minutes  SIGNED:   Aline August, MD  [facility] 05/04/2020, 9:50 AM

## 2020-05-04 NOTE — Telephone Encounter (Signed)
ATC pt x 2. Line rang but then went silent and disconnected.

## 2020-05-04 NOTE — Progress Notes (Signed)
Blood sugar check continued; 0200-59  0400-55 0600-70  Patient has had numerous snacks since midnight  (applesauce, orange juice, tuna salad, pineapple chunks) to aid in sugar replenish.

## 2020-05-04 NOTE — Progress Notes (Signed)
Patients blood sugars so far;   2000-76 2200-96 [id number]-woke patient up to address sugar level, she said she will drink Ensure to prevent it from dropping.

## 2020-05-04 NOTE — Progress Notes (Signed)
Discharge note:  Discharge order received and acknowledged. VSS on RA. IV and glucose monitor button removed. Pt had discussion with RD and had all questions answered. All belongings, including DC instructions and Rx, with pt upon discharge.

## 2020-05-04 NOTE — Progress Notes (Signed)
BGL this shift using pt own monitor: 0800: 74 1000: 99 1200: 79

## 2020-05-04 NOTE — Progress Notes (Signed)
Brief Nutrition Follow Up  RD consulted for diet education prior to patient discharge.   Discussed supplement regimen. Patient to see PCP early next week and make follow up lab draw appointment one month post discharge.   Regimen as follows:   Take Opurity chewable multivitamin without Iron as prescribed (once daily)  Take 500 mg calcium carbonate or TUMS TID (morning dose at least two hours apart from Opurity multivitamin)  Continue 500 mg Vitamin C BID until lab result shows normal level   Continue high dose Vitamin A 25,000 IU for 14 days, then transition to maintenance of 5,000-10,000 IU daily after  Continue selenium 200 mcg daily until lab result shows normal level  Discontinue zinc sulfate 200 mg upon discharge  Patient to discuss discontinuing folic acid with PCP as Opurity multivitamin meets requirement   Vitamin/MIneral Profile:  Thiamine B1: pending  Vitamin B12: 639 (wdl) Vitamin A:  7.8 (L) Vitamin D: 54 (wdl) Vitamin C: 0.2 (L)   Copper: 44 (L) Selenium: 87 (L)   Zinc: 45 (low normal)   Discussed going hypoglycemia and provided handout that encouraged small more frequent meals with complex carbohydrates, protein, and fat. Patient aware that she needs to consume bedtime snack and will be provided with glucometer for self monitoring.   MD discussed albumin with patient. Albumin has a half-life of 21 days and is strongly affected by stress response and inflammatory process, therefore, it is not recommended to fully assess nutrition status. Patient aware of high protein food options and that protein should be consumed at each meal to promote muscle gain.   Patient able to teach all material back and is very motivated to make changes.   Mariana Single RD, LDN Clinical Nutrition Pager listed in [place]

## 2020-05-05 ENCOUNTER — Encounter: Payer: Self-pay | Admitting: Internal Medicine

## 2020-05-05 NOTE — Telephone Encounter (Signed)
Spoke with the pt  She seemed confused about why we where calling her  She states that there is nothing needed, and that she has already completed her ct chest  Nothing needed per pt

## 2020-05-06 ENCOUNTER — Ambulatory Visit (INDEPENDENT_AMBULATORY_CARE_PROVIDER_SITE_OTHER): Payer: Medicare Other | Admitting: Internal Medicine

## 2020-05-06 ENCOUNTER — Encounter: Payer: Self-pay | Admitting: Internal Medicine

## 2020-05-06 ENCOUNTER — Other Ambulatory Visit: Payer: Self-pay

## 2020-05-06 VITALS — BP 120/88 | HR 86 | Ht 67.0 in | Wt 174.0 lb

## 2020-05-06 DIAGNOSIS — C73 Malignant neoplasm of thyroid gland: Secondary | ICD-10-CM | POA: Diagnosis not present

## 2020-05-06 DIAGNOSIS — E559 Vitamin D deficiency, unspecified: Secondary | ICD-10-CM

## 2020-05-06 DIAGNOSIS — M810 Age-related osteoporosis without current pathological fracture: Secondary | ICD-10-CM | POA: Diagnosis not present

## 2020-05-06 DIAGNOSIS — E162 Hypoglycemia, unspecified: Secondary | ICD-10-CM

## 2020-05-06 DIAGNOSIS — E890 Postprocedural hypothyroidism: Secondary | ICD-10-CM | POA: Diagnosis not present

## 2020-05-06 MED ORDER — GLUCAGON 3 MG/DOSE NA POWD: 3.0000 mg | Freq: Once | NASAL | 11 refills | Status: DC | PRN

## 2020-05-06 NOTE — Patient Instructions (Addendum)
Please continue: - Vitamin D 50,000 U weekly - Calcitriol 0.25 mcg 2x a day - calcium 1500 mg at dinnertime  Also, continue levothyroxine 137 mcg daily.  Take the thyroid hormone every day, with water, at least 30 minutes before breakfast, separated by at least 4 hours from: - acid reflux medications - calcium - iron - multivitamins  Please add 1-2 tablespoons of cornstarch at bedtime mixed with a snack.  Try to get the Glucagon from the pharmacy.  Please come back for a follow-up appointment in 1 month.

## 2020-05-06 NOTE — Progress Notes (Signed)
Patient ID: Jillian Eaton, female   DOB: [birth year], 70 y.o.   MRN: [id number]   This visit occurred during the SARS-CoV-2 public health emergency.  Safety protocols were in place, including screening questions prior to the visit, additional usage of staff PPE, and extensive cleaning of exam room while observing appropriate contact time as indicated for disinfecting solutions.   HPI  Jillian Eaton is a 70 y.o.-year-old female, initially referred by her PCP, Dr. Sabas Sous, returning for h/o thyroid microcarcinoma,  postsurgical hypothyroidism, postsurgical hypocalcemia, osteoporosis, and hypoglycemia. Last visit 4 months ago.  Interim history: -Patient was recently admitted to the hospital (04/21/2020) for shortness of breath, fatigue, orthostatic hypotension in the setting of severe malnutrition.  She was found to have multiple nutrient deficiencies.  She had investigation by cardiology and pulmonary without new pathology found.  However, she was found to be hypoglycemic and her hypoglycemia persisted throughout her hospitalization.  Lowest blood sugars in the 30s.  This happened throughout the day and also at night.  She was discharged with blood sugars in the 50s.  After discharge, she has done very well.  She is checking sugars frequently and these are usually between 60s and 100, occasionally slightly higher.  She also had few values in the 50s.  She is waking up at night to check her blood sugars and may eat carbs at that time.  She feels well, much better after she started to take the recommended vitamins and starting to eat more.  -At last visit, she is concerned about 20 pound weight loss in the previous month.  An MRI showed a large pericolic fluid collection.  She had a CT scan before last visit and this did not show any intra-abdominal pathology to account for this collection.  She had another contrasted CT scan while in the hospital with essentially the same results.   The adrenal glands and pancreas did not show any pathology.  Reviewing hypoglycemia work-up in the hospital:   Sulfonylurea screen was negative, insulin antibodies were slightly elevated, however, the insulin level was not very high.  In fact, closer to discharge, this has decreased to under 2. Her pro insulin was not very high.  C-peptide was detectable but decreased.  Beta hydroxybutyrate was low.  No signs of adrenal insufficiency-cosyntropin stimulation test was normal: Component     Latest Ref Rng & Units 04/23/2020  Cortisol, Base     ug/dL 6.5  Cortisol, 30 Min     ug/dL 16.7  Cortisol, 60 Min     ug/dL 20.0   In the hospital, she was evaluated by nutrition and placed on frequent feedings.  She was sent home with a glucometer.  At this visit, she denies use of: -Methimazole in the past -Alpha-lipoic acid  On One Touch Verio Flex  Dr. Benson Norway.  Reviewing her chart, she had a very low blood sugar, at 29 on CMP from 05/08/2018 (see below).  She was not symptomatic at that time.  She also had a seizure in 05/2018, believed to be due to stopping Lyrica.  Reviewed previous history. 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 Fredirick Maudlin at [facility].  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  She developed postsurgical hypothyroidism and, unfortunately, also postsurgical hypocalcemia, both uncontrolled.  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 Benign hyperplastic thyroid tissue. See comment. COMMENT: The papillary carcinoma appears to be incidental.  On 06/19/2017 she developed atrial fibrillation.  At that time, her TSH is very suppressed, at 0.07.  She was previously on 300 mcg levothyroxine  daily, gradually decreased-currently on 137 mcg daily (dose increased 06/2019): - in am - fasting - at least 30 min from b'fast - + calcium at night - no iron - no multivitamins - no PPIs - not on Biotin  Reviewed her TFTs: Lab Results  Component Value Date   TSH 4.487 04/22/2020   TSH 1.70 01/08/2020   TSH 0.53 08/20/2019   TSH 0.13 (L) 07/08/2019   TSH 7.71 (H) 06/13/2019   TSH 0.05 (L) 01/06/2019   TSH 1.21 11/27/2017   TSH 0.034 (L) 09/21/2017   TSH 1.48 08/03/2017   TSH 0.007 (L) 06/19/2017   FREET4 1.11 01/08/2020   FREET4 1.11 08/20/2019   FREET4 1.58 07/08/2019   FREET4 1.47 01/06/2019   FREET4 1.41 11/27/2017   FREET4 2.87 (H) 09/21/2017   FREET4 1.04 08/03/2017   FREET4 2.43 (H) 06/19/2017   FREET4 1.04 08/29/2016   T3FREE 1.0 (L) 06/14/2019  05/08/2018: TSH 2.87  Pt denies: - feeling nodules in neck - hoarseness - choking - SOB with lying down But she has chronic dysphagia  No FH of thyroid cancer. No h/o radiation tx to head or neck.  No herbal supplements. No Biotin use. + recent steroids use -Solu-Medrol in the hospital.    Hypocalcemia:  She developed iatrogenic hypocalcemia after her thyroid surgery.  Her PTH was inappropriately normal, possibly related to her CKD.  She was admitted with hypocalcemia and acute respiratory failure on 07/10/2017.  She again had hypocalcemia at her recent admission in 04/2019 along with SOB, fatigue.  Reviewed pertinent labs: 01/08/2020: Ionized calcium 4.7 08/20/2019: Ionized calcium 4.09 (4.8-5.6) Lab Results  Component Value Date   PTH 40 11/27/2017   PTH 44 08/03/2017   PTH 65 07/10/2017   PTH Comment 07/10/2017   CALCIUM 8.0 (L) 05/04/2020   CALCIUM 7.8 (L) 04/29/2020   CALCIUM 7.5 (L) 04/26/2020   CALCIUM 7.0 (L) 04/25/2020   CALCIUM 6.9 (L) 04/24/2020   CALCIUM 6.7 (L) 04/23/2020   CALCIUM 6.3 (LL) 04/22/2020   CALCIUM 6.9 (L) 04/21/2020   CALCIUM 6.6 (LL) 04/14/2020   CALCIUM 6.0 (L) 04/02/2020   10/09/2017: Ca 8.1 (8.7-10.3)  She has a history of low magnesium and high phosphorus.  Magnesium was normal at last check: Lab Results  Component Value Date   MG 1.7 05/04/2020   MG 1.7 04/29/2020   MG 1.6 (L) 04/22/2020   MG 2.0 06/19/2019   MG 2.1 06/18/2019   MG 2.2 06/17/2019   MG 1.8 06/16/2019   MG 1.7 06/05/2019   MG 1.4 (L) 12/07/2018   MG 1.6 11/27/2017   MG 1.5 08/03/2017   MG 2.1 07/15/2017   MG 1.6 (L) 07/14/2017   MG 1.3 (L) 07/13/2017   MG 1.2 (L) 07/10/2017   MG 1.8 07/11/2016   MG 2.2 07/09/2016   MG 1.6 (L) 07/09/2016   MG 1.9 07/08/2016   MG 2.0 07/08/2016  10/09/2017: Mg 2.0 (1.6-2.3)  Lab Results  Component Value Date   PHOS 3.2 06/16/2019   PHOS 3.8 11/27/2017   PHOS 6.3 (H) 08/03/2017   PHOS 7.4 (H) 07/10/2017   PHOS 3.6 07/11/2016   PHOS 4.0 07/09/2016   PHOS 3.5 07/09/2016   PHOS 5.2 (H) 07/08/2016  PHOS 6.6 (H) 07/08/2016   PHOS 8.0 (H) 07/08/2016   PHOS 3.6 04/25/2016   PHOS 3.5 04/25/2016   PHOS 3.3 04/24/2016   PHOS 3.7 03/12/2016   PHOS 6.2 (H) 03/10/2016  10/09/2017: phos 4.5 (2.5-4.5)  05/08/2018: -CMP: Glucose 29 (!),  BUN/creatinine 19/0.94, GFR 74, calcium 8.3 (8.7-10.3), Albumin 3.6 (3.8-4.8) -Vitamin D 39.8 -TSH 2.87 -Phosphorus 3.6 (3-4.3) -Magnesium 1.6 (1.6-2.3)  -Ferritin 29 (15-150), hemoglobin 11.1 (11.1-15.9) Corrected calcium: 8.62, slightly lower than normal, which is our target. I was surprised by her very low glucose (possibly lab artifact as she did not have any symptoms at the time of the blood draw).  She does not have a history of diabetes but has steroid-induced hyperglycemia...   She is on the following regimen: -  >> she stopped taking it in 08/2018 (!!) >> restarted 12/2018: Now takes 2000 mg with D - high-dose vitamin D 50,000 units weekly - calcitriol 0.5 mcg 2x a day (she did not decrease to 0.25 mcg 2x a day as advised at last visit, and she forgot) >> 0.25 mcg daily >> 0.25 mcg twice a day  We  also started HCTZ 25 mg daily.  In the meantime, we reduced her Lasix to 50% of her previous dose.  Now taking it as needed.  She did not take it since 09/2019.  She denies hand cramping or perioral numbness.  History of vitamin D deficiency.    Vitamin D levels were normal: Lab Results  Component Value Date   VD25OH 58.6 01/08/2020   VD25OH 65.6 07/08/2019   VD25OH 107.02 (H) 06/16/2019   VD25OH 63.90 01/06/2019   VD25OH 45.84 11/27/2017   VD25OH 43.9 07/14/2017  05/08/2018: Vitamin D 39.8  Calcitriol level was not elevated: Component     Latest Ref Rng & Units 11/27/2017  Vitamin D 1, 25 (OH) Total     18 - 72 pg/mL 54  Vitamin D3 1, 25 (OH)     pg/mL 30  Vitamin D2 1, 25 (OH)     pg/mL 24   Osteoporosis:  Reviewed previous DXA scan reports: DXA - [facility] (01/09/2020) Lumbar spine L1-L4 (L2) Femoral neck (FN) 33% distal radius Ultra distal radius  T-score -0.9 RFN: -1.1 LFN: -1.8 -2.0 -3.8  Change in BMD from previous DXA test (%)  +8.9%* RFN: n/a LFN: +21.9%* n/a  -4.5%  (*) statistically significant  DXA  - [facility] (12/10/2017) Lumbar spine L1-L4 (L2) Femoral neck (FN) 33% distal left radius Ultra distal left radius  T-score -1.6 RFN: N/a LFN: -2.8  -2.3  -3.5   She is on Prolia: 01/03/2018 07/16/2018 01/21/2019 08/05/2019 02/04/2020  She tolerates this well.  No jaw, hip, thigh pain.  + CKD. Last BUN/Cr: Lab Results  Component Value Date   BUN 32 (H) 05/04/2020   CREATININE 1.03 (H) 05/04/2020   She surgery for torn rotator cuffs in both shoulders. She has atrial fibrillation and is status post cardioversion. She was on amiodarone before, now off. She was admitted 07/2018 with SBO and 11/2018 with acute on chronic respiratory failure and pulmonary hypertension. She had right TKR 02/18/2019.  She had left TKR 11/14/2018. She ahad a seizure 05/2018.  She was suspected for stroke, but this was ruled out. Of note, upon questioning, she stopped Lyrica 1 week prior  to the seizure.  She was taking this for fibromyalgia.  She was started on Keppra. She has a history of kidney stones and sees urology.  She had cystoscopy with stent  placement 07/2019.  CT scan showed a 3 mm kidney stone in the right kidney upper pole -this is nonobstructing. She also continues to see Dr. Jana Hakim for her anemia.  She feels much better after she started Feraheme in August.  ROS: Constitutional: no weight gain/no weight loss, no fatigue, no subjective hyperthermia, no subjective hypothermia Eyes: no blurry vision, no xerophthalmia ENT: no sore throat, + see HPI Cardiovascular: no CP/no SOB/no palpitations/no leg swelling Respiratory: no cough/no SOB/no wheezing Gastrointestinal: no N/no V/no D/no C/no acid reflux Musculoskeletal: no muscle aches/no joint aches Skin: no rashes, no hair loss Neurological: no tremors/no numbness/no tingling/no dizziness  I reviewed pt's medications, allergies, PMH, social hx, family hx, and changes were documented in the history of present illness. Otherwise, unchanged from my initial visit note.   Past Medical History:  Diagnosis Date  . Anemia    iron and pernicious  . Arthritis   . Asthma   . Cancer ([place])   . CHF (congestive heart failure) ([place]) 2018  . Complication of anesthesia    BP dropped   . DOE (dyspnea on exertion)    2D ECHO, 02/12/2012 - EF 60-65%, moderate concentric hypertrophy  . Dyspnea   . Dysrhythmia    a-fib  . Fibromyalgia    nerve pain"left side at waist level" "can't lay on that side without pain" , "HOB elevation helps"; pt. thinks this has resolved after 200lb weight loss9-17- since i lost the wieght , i dont  think i have this anymore   . Headache 04/2019  . Heart murmur   . Hematuria - cause not known    resolved   . History of COVID-19   . History of kidney stones    x 2 '13, '14 surgery to remove  . Hypertension   . Pneumonia    aspiration  04/2016  . Presence of permanent cardiac pacemaker   .  SBO (small bowel obstruction) ([place])    rqueired admission 2020  . Thyroid disease    "goiter"  . Transfusion history    10 yrs+   Past Surgical History:  Procedure Laterality Date  . ABDOMINAL ADHESION SURGERY  2004   open LOA - in [place]  . AV NODE ABLATION N/A 06/20/2019   Procedure: AV NODE ABLATION;  Surgeon: Evans Lance, MD;  Location: [facility];  Service: Cardiovascular;  Laterality: N/A;  . BALLOON DILATION N/A 07/25/2019   Procedure: BALLOON DILATION;  Surgeon: Carol Ada, MD;  Location: [facility];  Service: Endoscopy;  Laterality: N/A;  . CARDIAC CATHETERIZATION  04/04/2010   No significant obstructive coronary artery disease  . CARDIOVERSION N/A 08/08/2017   Procedure: CARDIOVERSION;  Surgeon: Pixie Casino, MD;  Location: [facility];  Service: Cardiovascular;  Laterality: N/A;  . CARDIOVERSION N/A 06/18/2019   Procedure: CARDIOVERSION;  Surgeon: Pixie Casino, MD;  Location: [facility];  Service: Cardiovascular;  Laterality: N/A;  . CHOLECYSTECTOMY  1990  . COLONOSCOPY W/ POLYPECTOMY    . COLONOSCOPY WITH PROPOFOL N/A 04/10/2014   Procedure: COLONOSCOPY WITH PROPOFOL;  Surgeon: Beryle Beams, MD;  Location: [facility];  Service: Endoscopy;  Laterality: N/A;  . CYSTOSCOPY/URETEROSCOPY/HOLMIUM LASER/STENT PLACEMENT Right 08/13/2019   Procedure: CYSTOSCOPY/RETROGRADE/URETEROSCOPY/HOLMIUM LASER/STENT PLACEMENT;  Surgeon: Ceasar Mons, MD;  Location: [facility];  Service: Urology;  Laterality: Right;  ONLY NEEDS 45 MIN  . ESOPHAGOGASTRODUODENOSCOPY (EGD) WITH PROPOFOL N/A 07/25/2019   Procedure: ESOPHAGOGASTRODUODENOSCOPY (EGD) WITH PROPOFOL;  Surgeon: Carol Ada, MD;  Location: [facility];  Service: Endoscopy;  Laterality: N/A;  . EYE SURGERY     lasik 20-25 yrs ago  . GASTROPLASTY VERTICAL BANDED  1985  . [place]   In [place] for SBO  . MALONEY DILATION  07/25/2019   Procedure: Venia Minks DILATION;   Surgeon: Carol Ada, MD;  Location: [facility];  Service: Endoscopy;;  . PACEMAKER IMPLANT N/A 06/20/2019   Procedure: PACEMAKER IMPLANT;  Surgeon: Evans Lance, MD;  Location: [facility];  Service: Cardiovascular;  Laterality: N/A;  . REVERSE SHOULDER ARTHROPLASTY Right 03/29/2018   Procedure: REVERSE SHOULDER ARTHROPLASTY;  Surgeon: Netta Cedars, MD;  Location: [facility];  Service: Orthopedics;  Laterality: Right;  . RIGHT/LEFT HEART CATH AND CORONARY ANGIOGRAPHY N/A 04/23/2020   Procedure: RIGHT/LEFT HEART CATH AND CORONARY ANGIOGRAPHY;  Surgeon: Jolaine Artist, MD;  Location: [facility];  Service: Cardiovascular;  Laterality: N/A;  . ROUX-EN-Y GASTRIC BYPASS  1992   Conversion VBG to RnYGB in [place], [place]  . SHOULDER ARTHROSCOPY WITH SUBACROMIAL DECOMPRESSION  2016   Dr Berenice Primas, [place], [place]  . SMALL INTESTINE SURGERY  2000   SBO - LOA w SB resection  . TOTAL KNEE ARTHROPLASTY Left 11/14/2018   Procedure: TOTAL KNEE ARTHROPLASTY;  Surgeon: Paralee Cancel, MD;  Location: [facility];  Service: Orthopedics;  Laterality: Left;  70 mins  . TOTAL KNEE ARTHROPLASTY Right 04/29/2019   Procedure: TOTAL KNEE ARTHROPLASTY;  Surgeon: Paralee Cancel, MD;  Location: [facility];  Service: Orthopedics;  Laterality: Right;  70 mins  . TOTAL THYROIDECTOMY  06/26/2012   Dr Celine Ahr, [facility]  . TUBAL LIGATION  1986  . URETHRAL DILATION  2012   w cystoscopy.  Dr Gaynelle Arabian  . UTERINE FIBROID EMBOLIZATION     Social History   Socioeconomic History  . Marital status: Married    Spouse name: Not on file  . Number of children: 3  . Years of education: Not on file  . Highest education level: Master's degree (e.g., MA, MS, MEng, MEd, MSW, MBA)  Occupational History  . Occupation: retired  Tobacco Use  . Smoking status: Never Smoker  . Smokeless tobacco: Never Used  Vaping Use  . Vaping Use: Never used  Substance and Sexual Activity  . Alcohol use: No    Comment: wine occ  . Drug  use: No    Types: Oxycodone    Comment: perscribed  . Sexual activity: Not Currently    Birth control/protection: None  Other Topics Concern  . Not on file  Social History Narrative   Right handed   1 cup of caffeine per day        Social Determinants of Health   Financial Resource Strain: Not on file  Food Insecurity: Not on file  Transportation Needs: Not on file  Physical Activity: Not on file  Stress: Not on file  Social Connections: Not on file  Intimate Partner Violence: Not on file   Current Outpatient Medications on File Prior to Visit  Medication Sig Dispense Refill  . acetaminophen (TYLENOL) 650 MG CR tablet Take 1,300 mg by mouth every 8 (eight) hours as needed for pain.    Marland Kitchen albuterol (VENTOLIN HFA) 108 (90 Base) MCG/ACT inhaler Inhale 2 puffs into the lungs every 6 (six) hours as needed for wheezing or shortness of breath.    Marland Kitchen ascorbic acid (VITAMIN C) 500 MG tablet Take 1 tablet (500 mg total) by mouth 2 (two) times daily. 60 tablet 0  .  Beta Carotene (VITAMIN A) 25000 UNIT capsule Take 1 capsule (25,000 Units total) by mouth daily for 10 days. 10 capsule 0  . blood glucose meter kit and supplies KIT Dispense based on patient and insurance preference. Use up to four times daily or as needed with symptoms of hypoglycemia. 1 each 0  . calcitRIOL (ROCALTROL) 0.25 MCG capsule Take 1 capsule (0.25 mcg total) by mouth 2 (two) times daily.    . calcium carbonate (TUMS - DOSED IN MG ELEMENTAL CALCIUM) 500 MG chewable tablet Chew 1 tablet (200 mg of elemental calcium total) by mouth 3 (three) times daily with meals. 90 tablet 0  . cyanocobalamin (,VITAMIN B-12,) 1000 MCG/ML injection Inject 1 mL (1,000 mcg total) into the muscle every 30 (thirty) days. 1 mL 0  . cyclobenzaprine (FLEXERIL) 10 MG tablet Take 20 mg by mouth at bedtime.     Mariane Baumgarten Sodium (DSS) 100 MG CAPS Take 100 mg by mouth daily.    . DULoxetine (CYMBALTA) 60 MG capsule Take 1 capsule (60 mg total) by mouth  daily. 30 capsule 0  . ELIQUIS 5 MG TABS tablet TAKE 1 TABLET TWICE A DAY (Patient taking differently: Take 5 mg by mouth 2 (two) times daily.) 180 tablet 1  . eszopiclone (LUNESTA) 2 MG TABS tablet Take 1 tablet (2 mg total) by mouth at bedtime. 20 tablet 0  . famotidine (PEPCID) 20 MG tablet Take 20 mg by mouth daily as needed for heartburn or indigestion.    Marland Kitchen FOLIC ACID PO Take 1 tablet by mouth daily.    . furosemide (LASIX) 20 MG tablet Take 20 mg by mouth daily as needed for edema.    Marland Kitchen levothyroxine (SYNTHROID) 137 MCG tablet TAKE 1 TABLET (137 MCG TOTAL) BY MOUTH DAILY BEFORE BREAKFAST. 90 tablet 1  . magnesium oxide (MAG-OX) 400 (241.3 Mg) MG tablet Take 400 mg by mouth daily.     . mirtazapine (REMERON) 7.5 MG tablet Take 7.5 mg by mouth at bedtime.    . montelukast (SINGULAIR) 10 MG tablet TAKE 1 TABLET BY MOUTH EVERYDAY AT BEDTIME (Patient taking differently: Take 10 mg by mouth at bedtime.) 90 tablet 0  . Multiple Vitamin (MULTIVITAMIN WITH MINERALS) TABS tablet Take 1 tablet by mouth 2 (two) times daily.    . potassium chloride (KLOR-CON) 10 MEQ tablet Take 10 mEq by mouth daily as needed (low potassium).    . traMADol (ULTRAM) 50 MG tablet Take 50 mg by mouth 3 (three) times daily as needed for moderate pain.    . Vitamin D, Ergocalciferol, (DRISDOL) 1.25 MG (50000 UT) CAPS capsule Take 1 capsule (50,000 Units total) by mouth every Wednesday. 4 capsule 0   No current facility-administered medications on file prior to visit.   Allergies  Allergen Reactions  . Oxycodone-Acetaminophen Itching, Nausea And Vomiting, Rash and Other (See Comments)    Other reaction(s): Unknown  . Hydrocodone Itching        Family History  Problem Relation Age of Onset  . Diabetes Mother   . Epilepsy Mother   . Cancer Mother        Breast  . Hypertension Mother   . Breast cancer Mother   . Seizures Mother   . Kidney disease Father   . Diabetes Father   . Hypertension Father   . Asthma  Father   . Heart disease Father   . Epilepsy Sister   . Cancer Maternal Grandmother   . Breast cancer Maternal Grandmother   .  Cancer Paternal Grandmother   . Breast cancer Paternal Grandmother     PE: BP 120/88 (BP Location: Left Arm, Patient Position: Sitting, Cuff Size: Normal)   Pulse 86   Ht 5' 7"  (1.702 m)   Wt 174 lb (78.9 kg)   SpO2 96%   BMI 27.25 kg/m  Wt Readings from Last 3 Encounters:  05/06/20 174 lb (78.9 kg)  05/04/20 177 lb 11.1 oz (80.6 kg)  04/19/20 171 lb 12.8 oz (77.9 kg)   Constitutional: overweight, in NAD Eyes: PERRLA, EOMI, no exophthalmos ENT: moist mucous membranes, no thyromegaly, no cervical lymphadenopathy Cardiovascular: RRR, No MRG, + LE mild periankle edema bilaterally Respiratory: CTA B Gastrointestinal: abdomen soft, NT, ND, BS+ Musculoskeletal: no deformities, strength intact in all 4 Skin: moist, warm, no rashes Neurological: no tremor with outstretched hands, DTR normal in all 4  Assessment:  1. Hypoglycemia  2. Papillary thyroid cancer  3. Postsurgical hypothyroidism  4. Postsurgical  Hypocalcemia  5. Osteoporosis  6. Vitamin D deficiency   PLAN: 1.  Hypoglycemia -I reviewed previous investigation in the hospital and we discussed about possible etiology for hypoglycemia:  Longstanding malnutrition due to depleted glycogen hepatic stores - however, she did have a higher C-peptide and insulin than expected in this situationHypoglycemia due to malnutrition is usually slow to resolve.  Also, patients can develop refeeding hypoglycemia after they start to eat, prolonging the condition.  She was in the hospital for 2 weeks.  After discharge, her blood sugars at home are now better, with only one value in the 50s, the rest 60s and up to 110.  This is encouraging.  History of gastric bypass -through multiple mechanisms including increase GLP 1 secretion, increase sensitivity to insulin, increased production of insulin due to improved  function  Adrenal insufficiency-however, she ruled out for this in the hospital  Autoimmune hypoglycemia Atilano Median disease - in the absence of previous insulin use) -she had mildly positive insulin antibodies, however, insulin levels were low, which is not consistent with this condition.  It is possible that her mildly positive insulin antibodies may be related to another autoimmune disease and not necessarily the pathogenic cause for her hypoglycemia at the moment.  Antibodies against insulin can appear after previous use of insulin, after methimazole or alpha-lipoic acid, but she has not used these agents in the past.  Treatment for this is usually small frequent meals.  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  Also, proinsulin or insulin level are not very elevated.  However, we may need to check an endoscopic ultrasound of her pancreas to detect smaller masses.  She sees Dr. Geoffry Paradise and she had an endoscopy and colonoscopy planned, but she did not have this due to hospitalization.  I discussed with the patient that I do recommend that, if possible, an EUS be performed at the time of her EGD.  Nesidioblastosis after gastric bypass, this is extremely rare, and almost impossibly to differentiate from insulinoma -For now, I will send a prescription for glucagon to her pharmacy and discussed how to use it.  Her husband has dementia and is blind and cannot help her.  She has a neighbor who she can trained to use it. -I advised her to check her blood sugars frequently and at least once during the night -Discussed about starting to take 1 to 2 tablespoons of starch at bedtime to maintain blood sugars during the night -Discussed about using 3 tablets of glucose during  the day if sugars start dropping-given 15-15 protocol instructions -I also suggested that she continues to see nutrition.  She needs to eat frequently during the day -I am reticent to recommend a CGM for her  since this will alarm her constantly due to the baseline low blood sugars.  Also, at low values, these are not accurate. -Review latest HbA1c which was very low, at 4.0% -Looking back at her CMP from 04/2018, I am wondering whether the glucose of 29 may not have been a lab error... Also, question if her seizure from 05/2018 was related to hypoglycemia... -If her hypoglycemia does not improve, I may refer her to [facility] or [facility] for further investigation.  2. PTC Patient had an incidentally found focus of micropapillary thyroid cancer, which was discovered after her thyroidectomy for enlarging nontoxic multinodular goiter.  Since the cancer was so small, this is most likely cured by her thyroidectomy. -No imaging follow-up is necessary.  We will continue to follow her clinically. -No neck compression symptoms except for chronic dysphagia -At last visit, we checked her ATA antibodies and her thyroglobulin and these were undetectable.  We will repeat them at next visit.  3. 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 - we discussed about taking the thyroid hormone every day, with water, >30 minutes before breakfast, separated by >4 hours from acid reflux medications, calcium, iron, multivitamins. Pt. is taking it correctly.  4. Patient with postoperative, iatrogenic hypocalcemia -In 2019, she had a very low blood calcium of 5.5.  Vitamin D level was normal.  She does have a history of magnesium deficiency and high phosphorus.  Restarted calcitriol and her calcium levels improved and her magnesium and phosphorus normalized. Desirable calcium levels are at the lower limit of normal or slightly higher. -At the end of last year she was off calcium supplements for 4 months.  An ionized calcium was normal, 4.9  (4.8-5.6).  Afterwards, I recommended to start the calcium tablet with dinner -Latest ionized calcium was only slightly low, at 4.7 (4.8-5.6) in 12/2019, but most recent total calcium was low, at 8.0 in 04/2020 (while still in the hospital, possibly due to increase hydration): Lab Results  Component Value Date   CALCIUM 8.0 (L) 05/04/2020  -She is asymptomatic -Vitamin D level was normal -She takes calcium 500 mg x 2 with dinner -She also takes calcitriol 0.25 mg twice a day, increased after last visit -Currently, Natpara was retracted from the market after being due to presence of contaminants  5. 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  The scores were reviewed with the patient: Increased at the spine and left femoral neck.  Her lowest BMD was at the ultra distal radius.  6.  Vitamin D deficiency -Latest vitamin D level was reviewed and this was normal at last check 06/2019 -Continues high-dose ergocalciferol 50,000 units weekly -will recheck the level at next OV  - Total time spent for the visit: 1h, in  obtaining medical information from the chart, reviewing her  previous labs, imaging evaluations, and treatments, reviewing her symptoms, counseling her about her conditions (please see the discussed topics above), and developing a plan to further investigate and treat them; she had a number of questions which I addressed.   Philemon Kingdom, MD PhD [facility] Endocrinology

## 2020-05-09 ENCOUNTER — Other Ambulatory Visit: Payer: Self-pay | Admitting: Internal Medicine

## 2020-05-10 DIAGNOSIS — E162 Hypoglycemia, unspecified: Secondary | ICD-10-CM | POA: Diagnosis not present

## 2020-05-10 DIAGNOSIS — E43 Unspecified severe protein-calorie malnutrition: Secondary | ICD-10-CM | POA: Diagnosis not present

## 2020-05-10 DIAGNOSIS — Z758 Other problems related to medical facilities and other health care: Secondary | ICD-10-CM | POA: Diagnosis not present

## 2020-05-10 DIAGNOSIS — E878 Other disorders of electrolyte and fluid balance, not elsewhere classified: Secondary | ICD-10-CM | POA: Diagnosis not present

## 2020-05-10 DIAGNOSIS — D519 Vitamin B12 deficiency anemia, unspecified: Secondary | ICD-10-CM | POA: Diagnosis not present

## 2020-05-10 DIAGNOSIS — E509 Vitamin A deficiency, unspecified: Secondary | ICD-10-CM | POA: Diagnosis not present

## 2020-05-10 DIAGNOSIS — R0609 Other forms of dyspnea: Secondary | ICD-10-CM | POA: Diagnosis not present

## 2020-05-10 DIAGNOSIS — E54 Ascorbic acid deficiency: Secondary | ICD-10-CM | POA: Diagnosis not present

## 2020-05-10 NOTE — Telephone Encounter (Signed)
Ok to refill. DX code?

## 2020-05-11 ENCOUNTER — Encounter: Payer: Self-pay | Admitting: Internal Medicine

## 2020-05-11 DIAGNOSIS — E890 Postprocedural hypothyroidism: Secondary | ICD-10-CM

## 2020-05-12 ENCOUNTER — Other Ambulatory Visit: Payer: Self-pay | Admitting: Internal Medicine

## 2020-05-12 DIAGNOSIS — E890 Postprocedural hypothyroidism: Secondary | ICD-10-CM

## 2020-05-12 DIAGNOSIS — R630 Anorexia: Secondary | ICD-10-CM | POA: Diagnosis not present

## 2020-05-12 DIAGNOSIS — R634 Abnormal weight loss: Secondary | ICD-10-CM | POA: Diagnosis not present

## 2020-05-12 DIAGNOSIS — R0602 Shortness of breath: Secondary | ICD-10-CM | POA: Diagnosis not present

## 2020-05-12 MED ORDER — CALCITRIOL 0.25 MCG PO CAPS: 0.2500 ug | ORAL_CAPSULE | Freq: Two times a day (BID) | ORAL | 0 refills | Status: DC

## 2020-05-12 MED ORDER — CALCITRIOL 0.25 MCG PO CAPS
0.2500 ug | ORAL_CAPSULE | Freq: Two times a day (BID) | ORAL | 3 refills | Status: AC
Start: 2020-05-12 — End: ?

## 2020-05-19 NOTE — Progress Notes (Signed)
On: 04/21/2020 16:54   CARDIAC CATHETERIZATION  Result Date: 04/23/2020 Findings: Ao = 88/57 (71) LV = 82/2 RA = 1 RV = 17/1 PA = 21/5 (12) PCW = 6 Fick cardiac output/index = 4.0/2.0 PVR = 1.0 WU Ao sat = 96% PA sat = 62%, 62% Assessment: 1. Very ectatic but widely patent coronary arteries. No significant coronary stenoses. 2. LVEF 60% 3. Normal PA pressures with low left-sided filling pressures Plan/Discussion: Medical therapy. Glori Bickers, MD 12:55 PM   ECHOCARDIOGRAM COMPLETE  Result Date: 04/22/2020    ECHOCARDIOGRAM REPORT   Patient Name:   Jillian Eaton Date of Exam: 04/22/2020 Medical Rec #:  [id number]                Height:       67.0 in Accession #:    [id number]               Weight:       171.8 lb Date of Birth:  [birth year]                BSA:          1.896 m Patient Age:    70 years                 BP:           108/76 mmHg Patient Gender: F                        HR:           65 bpm. Exam Location:  Inpatient Procedure: 2D Echo, Cardiac Doppler and Color Doppler Indications:    Shortness of breath  History:        Patient has prior history of Echocardiogram examinations, most                 recent 07/08/2016. Pacemaker, Arrythmias:Atrial Fibrillation,                 Signs/Symptoms:Shortness of Breath; Risk Factors:Hypertension                 and Diabetes.  Sonographer:    [place] Referring Phys: [id number] [place]  1. Left ventricular ejection fraction, by estimation, is 55 to 60%. The left ventricle has normal function. The left ventricle has no regional wall motion abnormalities. There is mild concentric left ventricular hypertrophy. Left ventricular diastolic parameters are indeterminate.  2. Right ventricular systolic function is normal. The right ventricular size is mildly enlarged. There is normal pulmonary artery systolic  pressure.  3. Left atrial size was severely dilated.  4. Right atrial size was severely dilated.  5. The mitral valve is grossly normal. Trivial mitral valve regurgitation.  6. Tricuspid valve regurgitation is moderate.  7. The aortic valve is tricuspid. There is mild calcification of the aortic valve. There is mild thickening of the aortic valve. Aortic valve regurgitation is mild. Comparison(s): No significant change from prior study. FINDINGS  Left Ventricle: Left ventricular ejection fraction, by estimation, is 55 to 60%. The left ventricle has normal function. The left ventricle has no regional wall motion abnormalities. The left ventricular internal cavity size was normal in size. There is  mild concentric left ventricular hypertrophy. Abnormal (paradoxical) septal motion, consistent with RV pacemaker. Left ventricular diastolic parameters are indeterminate. Right Ventricle: The right ventricular size is mildly enlarged. Right vetricular wall thickness was not well visualized. Right ventricular systolic function  On: 04/21/2020 16:54   CARDIAC CATHETERIZATION  Result Date: 04/23/2020 Findings: Ao = 88/57 (71) LV = 82/2 RA = 1 RV = 17/1 PA = 21/5 (12) PCW = 6 Fick cardiac output/index = 4.0/2.0 PVR = 1.0 WU Ao sat = 96% PA sat = 62%, 62% Assessment: 1. Very ectatic but widely patent coronary arteries. No significant coronary stenoses. 2. LVEF 60% 3. Normal PA pressures with low left-sided filling pressures Plan/Discussion: Medical therapy. Glori Bickers, MD 12:55 PM   ECHOCARDIOGRAM COMPLETE  Result Date: 04/22/2020    ECHOCARDIOGRAM REPORT   Patient Name:   Jillian Eaton Date of Exam: 04/22/2020 Medical Rec #:  [id number]                Height:       67.0 in Accession #:    [id number]               Weight:       171.8 lb Date of Birth:  [birth year]                BSA:          1.896 m Patient Age:    70 years                 BP:           108/76 mmHg Patient Gender: F                        HR:           65 bpm. Exam Location:  Inpatient Procedure: 2D Echo, Cardiac Doppler and Color Doppler Indications:    Shortness of breath  History:        Patient has prior history of Echocardiogram examinations, most                 recent 07/08/2016. Pacemaker, Arrythmias:Atrial Fibrillation,                 Signs/Symptoms:Shortness of Breath; Risk Factors:Hypertension                 and Diabetes.  Sonographer:    [place] Referring Phys: [id number] [place]  1. Left ventricular ejection fraction, by estimation, is 55 to 60%. The left ventricle has normal function. The left ventricle has no regional wall motion abnormalities. There is mild concentric left ventricular hypertrophy. Left ventricular diastolic parameters are indeterminate.  2. Right ventricular systolic function is normal. The right ventricular size is mildly enlarged. There is normal pulmonary artery systolic  pressure.  3. Left atrial size was severely dilated.  4. Right atrial size was severely dilated.  5. The mitral valve is grossly normal. Trivial mitral valve regurgitation.  6. Tricuspid valve regurgitation is moderate.  7. The aortic valve is tricuspid. There is mild calcification of the aortic valve. There is mild thickening of the aortic valve. Aortic valve regurgitation is mild. Comparison(s): No significant change from prior study. FINDINGS  Left Ventricle: Left ventricular ejection fraction, by estimation, is 55 to 60%. The left ventricle has normal function. The left ventricle has no regional wall motion abnormalities. The left ventricular internal cavity size was normal in size. There is  mild concentric left ventricular hypertrophy. Abnormal (paradoxical) septal motion, consistent with RV pacemaker. Left ventricular diastolic parameters are indeterminate. Right Ventricle: The right ventricular size is mildly enlarged. Right vetricular wall thickness was not well visualized. Right ventricular systolic function  [place]  [phone] [phone]     ID: Jillian Eaton DOB: 07/11/1950  MR#: [id number]  JQB#:[id number]  Patient Care Team: [name], MD as PCP - General (Internal Medicine) [name] Nadean Corwin, MD as PCP - Cardiology (Cardiology) Evans Lance, MD as PCP - Electrophysiology (Cardiology) Carol Ada, MD as Consulting Physician (Gastroenterology) Chesley Mires, MD as Consulting Physician (Pulmonary Disease) Philemon Kingdom, MD as Consulting Physician (Internal Medicine) Justin Mend, MD as Attending Physician (Internal Medicine) Marzetta Board, DPM as Consulting Physician (Podiatry) Servando Salina, MD as Consulting Physician (Obstetrics and Gynecology) Paralee Cancel, MD as Consulting Physician (Orthopedic Surgery) Cameron Sprang, MD as Consulting Physician (Neurology) Chauncey Cruel, MD OTHER MD:  CHIEF COMPLAINT: multifactorial anemia  CURRENT TREATMENT: IV iron, [ b12 injections, oral folate]; denosumab/Prolia   INTERVAL HISTORY: Jillian Eaton returns today for follow up of her anemia.  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  These are being replaced   Since the last visit she went off [place].  We thought that might be affecting the hemoglobin although going off does not seem to have made much difference.  She does say that she wanted to get off the medication anyway and that she has not noticed any change clinically herself going off the medication.    Lab Results  Component Value Date   HGB 8.1 (L) 05/20/2020   HGB 7.9 (L) 05/04/2020   HGB 7.9 (L) 04/29/2020   HGB 8.2 (L) 04/25/2020   HGB 8.9 (L) 04/24/2020   Since her last visit, she underwent lumbar spine MRI on 12/26/2019 for back pain showing: multilevel degenerative changes, worst at L4-5 where there is severe spinal canal stenosis; 13 cm fluid collection in right paracolic gutter.  She  proceeded to CT abdomen/pelvis without contrast on 01/07/2020 showing: no acute pathology; small non-obstructing right renal upper pole calculi, no hydronephrosis.  She also underwent bone density screening on 01/09/2020 showing a T-score of -3.8 at the ultra distal radius, which is considered severe osteoporosis.  She also underwent CT abdomen/pelvis with contrast on 01/28/2020 showing: previously described right paracolic gutter fluid collection corresponds to a dilated biliopancreatic limb, which is unchanged in appearance when compared to prior CT; unchanged punctate right nephrolithiasis.  She also underwent bilateral screening mammography with tomography at [facility] on 02/02/2020 showing: breast density category B; no evidence of malignancy in either breast.   Finally, she also underwent angio chest CT on 04/10/2020 showing: negative for pulmonary emboli, evidence of pulmonary hypertension; small bilateral pleural effusions.   REVIEW OF SYSTEMS: Jillian Eaton tells me she is feeling "much better".  She has more strength, better appetite, normal bowel function, no abdominal discomfort, and generally "feels good".  She is not yet exercising on a regular basis but does say she has less shortness of breath.  Her [place] had been in [place] while she was hospitalized he is now back home (she is his primary caregiver).  She is taking a multivitamin (bariatric) together with the vitamins ANC.  The one thing she is not replacing orally is the iron.  She cannot find one that she can tolerate   COVID 19 VACCINATION STATUS: Moderna x2, most recently 03/2019   HISTORY OF CURRENT ILLNESS: From the original intake note:  Jillian Eaton has a longstanding history of anemia and iron deficiency.  She has been followed by hematology several years ago.  She has had two separate  On: 04/21/2020 16:54   CARDIAC CATHETERIZATION  Result Date: 04/23/2020 Findings: Ao = 88/57 (71) LV = 82/2 RA = 1 RV = 17/1 PA = 21/5 (12) PCW = 6 Fick cardiac output/index = 4.0/2.0 PVR = 1.0 WU Ao sat = 96% PA sat = 62%, 62% Assessment: 1. Very ectatic but widely patent coronary arteries. No significant coronary stenoses. 2. LVEF 60% 3. Normal PA pressures with low left-sided filling pressures Plan/Discussion: Medical therapy. Glori Bickers, MD 12:55 PM   ECHOCARDIOGRAM COMPLETE  Result Date: 04/22/2020    ECHOCARDIOGRAM REPORT   Patient Name:   Jillian Eaton Date of Exam: 04/22/2020 Medical Rec #:  [id number]                Height:       67.0 in Accession #:    [id number]               Weight:       171.8 lb Date of Birth:  [birth year]                BSA:          1.896 m Patient Age:    70 years                 BP:           108/76 mmHg Patient Gender: F                        HR:           65 bpm. Exam Location:  Inpatient Procedure: 2D Echo, Cardiac Doppler and Color Doppler Indications:    Shortness of breath  History:        Patient has prior history of Echocardiogram examinations, most                 recent 07/08/2016. Pacemaker, Arrythmias:Atrial Fibrillation,                 Signs/Symptoms:Shortness of Breath; Risk Factors:Hypertension                 and Diabetes.  Sonographer:    [place] Referring Phys: [id number] [place]  1. Left ventricular ejection fraction, by estimation, is 55 to 60%. The left ventricle has normal function. The left ventricle has no regional wall motion abnormalities. There is mild concentric left ventricular hypertrophy. Left ventricular diastolic parameters are indeterminate.  2. Right ventricular systolic function is normal. The right ventricular size is mildly enlarged. There is normal pulmonary artery systolic  pressure.  3. Left atrial size was severely dilated.  4. Right atrial size was severely dilated.  5. The mitral valve is grossly normal. Trivial mitral valve regurgitation.  6. Tricuspid valve regurgitation is moderate.  7. The aortic valve is tricuspid. There is mild calcification of the aortic valve. There is mild thickening of the aortic valve. Aortic valve regurgitation is mild. Comparison(s): No significant change from prior study. FINDINGS  Left Ventricle: Left ventricular ejection fraction, by estimation, is 55 to 60%. The left ventricle has normal function. The left ventricle has no regional wall motion abnormalities. The left ventricular internal cavity size was normal in size. There is  mild concentric left ventricular hypertrophy. Abnormal (paradoxical) septal motion, consistent with RV pacemaker. Left ventricular diastolic parameters are indeterminate. Right Ventricle: The right ventricular size is mildly enlarged. Right vetricular wall thickness was not well visualized. Right ventricular systolic function  On: 04/21/2020 16:54   CARDIAC CATHETERIZATION  Result Date: 04/23/2020 Findings: Ao = 88/57 (71) LV = 82/2 RA = 1 RV = 17/1 PA = 21/5 (12) PCW = 6 Fick cardiac output/index = 4.0/2.0 PVR = 1.0 WU Ao sat = 96% PA sat = 62%, 62% Assessment: 1. Very ectatic but widely patent coronary arteries. No significant coronary stenoses. 2. LVEF 60% 3. Normal PA pressures with low left-sided filling pressures Plan/Discussion: Medical therapy. Glori Bickers, MD 12:55 PM   ECHOCARDIOGRAM COMPLETE  Result Date: 04/22/2020    ECHOCARDIOGRAM REPORT   Patient Name:   Jillian Eaton Date of Exam: 04/22/2020 Medical Rec #:  [id number]                Height:       67.0 in Accession #:    [id number]               Weight:       171.8 lb Date of Birth:  [birth year]                BSA:          1.896 m Patient Age:    70 years                 BP:           108/76 mmHg Patient Gender: F                        HR:           65 bpm. Exam Location:  Inpatient Procedure: 2D Echo, Cardiac Doppler and Color Doppler Indications:    Shortness of breath  History:        Patient has prior history of Echocardiogram examinations, most                 recent 07/08/2016. Pacemaker, Arrythmias:Atrial Fibrillation,                 Signs/Symptoms:Shortness of Breath; Risk Factors:Hypertension                 and Diabetes.  Sonographer:    [place] Referring Phys: [id number] [place]  1. Left ventricular ejection fraction, by estimation, is 55 to 60%. The left ventricle has normal function. The left ventricle has no regional wall motion abnormalities. There is mild concentric left ventricular hypertrophy. Left ventricular diastolic parameters are indeterminate.  2. Right ventricular systolic function is normal. The right ventricular size is mildly enlarged. There is normal pulmonary artery systolic  pressure.  3. Left atrial size was severely dilated.  4. Right atrial size was severely dilated.  5. The mitral valve is grossly normal. Trivial mitral valve regurgitation.  6. Tricuspid valve regurgitation is moderate.  7. The aortic valve is tricuspid. There is mild calcification of the aortic valve. There is mild thickening of the aortic valve. Aortic valve regurgitation is mild. Comparison(s): No significant change from prior study. FINDINGS  Left Ventricle: Left ventricular ejection fraction, by estimation, is 55 to 60%. The left ventricle has normal function. The left ventricle has no regional wall motion abnormalities. The left ventricular internal cavity size was normal in size. There is  mild concentric left ventricular hypertrophy. Abnormal (paradoxical) septal motion, consistent with RV pacemaker. Left ventricular diastolic parameters are indeterminate. Right Ventricle: The right ventricular size is mildly enlarged. Right vetricular wall thickness was not well visualized. Right ventricular systolic function  On: 04/21/2020 16:54   CARDIAC CATHETERIZATION  Result Date: 04/23/2020 Findings: Ao = 88/57 (71) LV = 82/2 RA = 1 RV = 17/1 PA = 21/5 (12) PCW = 6 Fick cardiac output/index = 4.0/2.0 PVR = 1.0 WU Ao sat = 96% PA sat = 62%, 62% Assessment: 1. Very ectatic but widely patent coronary arteries. No significant coronary stenoses. 2. LVEF 60% 3. Normal PA pressures with low left-sided filling pressures Plan/Discussion: Medical therapy. Glori Bickers, MD 12:55 PM   ECHOCARDIOGRAM COMPLETE  Result Date: 04/22/2020    ECHOCARDIOGRAM REPORT   Patient Name:   Jillian Eaton Date of Exam: 04/22/2020 Medical Rec #:  [id number]                Height:       67.0 in Accession #:    [id number]               Weight:       171.8 lb Date of Birth:  [birth year]                BSA:          1.896 m Patient Age:    70 years                 BP:           108/76 mmHg Patient Gender: F                        HR:           65 bpm. Exam Location:  Inpatient Procedure: 2D Echo, Cardiac Doppler and Color Doppler Indications:    Shortness of breath  History:        Patient has prior history of Echocardiogram examinations, most                 recent 07/08/2016. Pacemaker, Arrythmias:Atrial Fibrillation,                 Signs/Symptoms:Shortness of Breath; Risk Factors:Hypertension                 and Diabetes.  Sonographer:    [place] Referring Phys: [id number] [place]  1. Left ventricular ejection fraction, by estimation, is 55 to 60%. The left ventricle has normal function. The left ventricle has no regional wall motion abnormalities. There is mild concentric left ventricular hypertrophy. Left ventricular diastolic parameters are indeterminate.  2. Right ventricular systolic function is normal. The right ventricular size is mildly enlarged. There is normal pulmonary artery systolic  pressure.  3. Left atrial size was severely dilated.  4. Right atrial size was severely dilated.  5. The mitral valve is grossly normal. Trivial mitral valve regurgitation.  6. Tricuspid valve regurgitation is moderate.  7. The aortic valve is tricuspid. There is mild calcification of the aortic valve. There is mild thickening of the aortic valve. Aortic valve regurgitation is mild. Comparison(s): No significant change from prior study. FINDINGS  Left Ventricle: Left ventricular ejection fraction, by estimation, is 55 to 60%. The left ventricle has normal function. The left ventricle has no regional wall motion abnormalities. The left ventricular internal cavity size was normal in size. There is  mild concentric left ventricular hypertrophy. Abnormal (paradoxical) septal motion, consistent with RV pacemaker. Left ventricular diastolic parameters are indeterminate. Right Ventricle: The right ventricular size is mildly enlarged. Right vetricular wall thickness was not well visualized. Right ventricular systolic function  [place]  [phone]) [phone] Fax:(336) [phone]     ID: Jillian Eaton DOB: 07/11/1950  MR#: [id number]  JQB#:[id number]  Patient Care Team: [name], MD as PCP - General (Internal Medicine) [name] Nadean Corwin, MD as PCP - Cardiology (Cardiology) Evans Lance, MD as PCP - Electrophysiology (Cardiology) Carol Ada, MD as Consulting Physician (Gastroenterology) Chesley Mires, MD as Consulting Physician (Pulmonary Disease) Philemon Kingdom, MD as Consulting Physician (Internal Medicine) Justin Mend, MD as Attending Physician (Internal Medicine) Marzetta Board, DPM as Consulting Physician (Podiatry) Servando Salina, MD as Consulting Physician (Obstetrics and Gynecology) Paralee Cancel, MD as Consulting Physician (Orthopedic Surgery) Cameron Sprang, MD as Consulting Physician (Neurology) Chauncey Cruel, MD OTHER MD:  CHIEF COMPLAINT: multifactorial anemia  CURRENT TREATMENT: IV iron, [ b12 injections, oral folate]; denosumab/Prolia   INTERVAL HISTORY: Jillian Eaton returns today for follow up of her anemia.  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  These are being replaced   Since the last visit she went off [place].  We thought that might be affecting the hemoglobin although going off does not seem to have made much difference.  She does say that she wanted to get off the medication anyway and that she has not noticed any change clinically herself going off the medication.    Lab Results  Component Value Date   HGB 8.1 (L) 05/20/2020   HGB 7.9 (L) 05/04/2020   HGB 7.9 (L) 04/29/2020   HGB 8.2 (L) 04/25/2020   HGB 8.9 (L) 04/24/2020   Since her last visit, she underwent lumbar spine MRI on 12/26/2019 for back pain showing: multilevel degenerative changes, worst at L4-5 where there is severe spinal canal stenosis; 13 cm fluid collection in right paracolic gutter.  She  proceeded to CT abdomen/pelvis without contrast on 01/07/2020 showing: no acute pathology; small non-obstructing right renal upper pole calculi, no hydronephrosis.  She also underwent bone density screening on 01/09/2020 showing a T-score of -3.8 at the ultra distal radius, which is considered severe osteoporosis.  She also underwent CT abdomen/pelvis with contrast on 01/28/2020 showing: previously described right paracolic gutter fluid collection corresponds to a dilated biliopancreatic limb, which is unchanged in appearance when compared to prior CT; unchanged punctate right nephrolithiasis.  She also underwent bilateral screening mammography with tomography at [facility] on 02/02/2020 showing: breast density category B; no evidence of malignancy in either breast.   Finally, she also underwent angio chest CT on 04/10/2020 showing: negative for pulmonary emboli, evidence of pulmonary hypertension; small bilateral pleural effusions.   REVIEW OF SYSTEMS: Jillian Eaton tells me she is feeling "much better".  She has more strength, better appetite, normal bowel function, no abdominal discomfort, and generally "feels good".  She is not yet exercising on a regular basis but does say she has less shortness of breath.  Her [place] had been in [place] while she was hospitalized he is now back home (she is his primary caregiver).  She is taking a multivitamin (bariatric) together with the vitamins ANC.  The one thing she is not replacing orally is the iron.  She cannot find one that she can tolerate   COVID 19 VACCINATION STATUS: Moderna x2, most recently 03/2019   HISTORY OF CURRENT ILLNESS: From the original intake note:  Jillian Eaton has a longstanding history of anemia and iron deficiency.  She has been followed by hematology several years ago.  She has had two separate  On: 04/21/2020 16:54   CARDIAC CATHETERIZATION  Result Date: 04/23/2020 Findings: Ao = 88/57 (71) LV = 82/2 RA = 1 RV = 17/1 PA = 21/5 (12) PCW = 6 Fick cardiac output/index = 4.0/2.0 PVR = 1.0 WU Ao sat = 96% PA sat = 62%, 62% Assessment: 1. Very ectatic but widely patent coronary arteries. No significant coronary stenoses. 2. LVEF 60% 3. Normal PA pressures with low left-sided filling pressures Plan/Discussion: Medical therapy. Glori Bickers, MD 12:55 PM   ECHOCARDIOGRAM COMPLETE  Result Date: 04/22/2020    ECHOCARDIOGRAM REPORT   Patient Name:   Jillian Eaton Date of Exam: 04/22/2020 Medical Rec #:  [id number]                Height:       67.0 in Accession #:    [id number]               Weight:       171.8 lb Date of Birth:  [birth year]                BSA:          1.896 m Patient Age:    70 years                 BP:           108/76 mmHg Patient Gender: F                        HR:           65 bpm. Exam Location:  Inpatient Procedure: 2D Echo, Cardiac Doppler and Color Doppler Indications:    Shortness of breath  History:        Patient has prior history of Echocardiogram examinations, most                 recent 07/08/2016. Pacemaker, Arrythmias:Atrial Fibrillation,                 Signs/Symptoms:Shortness of Breath; Risk Factors:Hypertension                 and Diabetes.  Sonographer:    [place] Referring Phys: [id number] [place]  1. Left ventricular ejection fraction, by estimation, is 55 to 60%. The left ventricle has normal function. The left ventricle has no regional wall motion abnormalities. There is mild concentric left ventricular hypertrophy. Left ventricular diastolic parameters are indeterminate.  2. Right ventricular systolic function is normal. The right ventricular size is mildly enlarged. There is normal pulmonary artery systolic  pressure.  3. Left atrial size was severely dilated.  4. Right atrial size was severely dilated.  5. The mitral valve is grossly normal. Trivial mitral valve regurgitation.  6. Tricuspid valve regurgitation is moderate.  7. The aortic valve is tricuspid. There is mild calcification of the aortic valve. There is mild thickening of the aortic valve. Aortic valve regurgitation is mild. Comparison(s): No significant change from prior study. FINDINGS  Left Ventricle: Left ventricular ejection fraction, by estimation, is 55 to 60%. The left ventricle has normal function. The left ventricle has no regional wall motion abnormalities. The left ventricular internal cavity size was normal in size. There is  mild concentric left ventricular hypertrophy. Abnormal (paradoxical) septal motion, consistent with RV pacemaker. Left ventricular diastolic parameters are indeterminate. Right Ventricle: The right ventricular size is mildly enlarged. Right vetricular wall thickness was not well visualized. Right ventricular systolic function  [facility]  [phone] [phone]     ID: Jillian Eaton DOB: 07/11/1950  MR#: [id number]  JQB#:[id number]  Patient Care Team: [name], MD as PCP - General (Internal Medicine) [name] Nadean Corwin, MD as PCP - Cardiology (Cardiology) Evans Lance, MD as PCP - Electrophysiology (Cardiology) Carol Ada, MD as Consulting Physician (Gastroenterology) Chesley Mires, MD as Consulting Physician (Pulmonary Disease) Philemon Kingdom, MD as Consulting Physician (Internal Medicine) Justin Mend, MD as Attending Physician (Internal Medicine) Marzetta Board, DPM as Consulting Physician (Podiatry) Servando Salina, MD as Consulting Physician (Obstetrics and Gynecology) Paralee Cancel, MD as Consulting Physician (Orthopedic Surgery) Cameron Sprang, MD as Consulting Physician (Neurology) Chauncey Cruel, MD OTHER MD:  CHIEF COMPLAINT: multifactorial anemia  CURRENT TREATMENT: IV iron, [ b12 injections, oral folate]; denosumab/Prolia   INTERVAL HISTORY: Jillian Eaton returns today for follow up of her anemia.  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  These are being replaced   Since the last visit she went off [place].  We thought that might be affecting the hemoglobin although going off does not seem to have made much difference.  She does say that she wanted to get off the medication anyway and that she has not noticed any change clinically herself going off the medication.    Lab Results  Component Value Date   HGB 8.1 (L) 05/20/2020   HGB 7.9 (L) 05/04/2020   HGB 7.9 (L) 04/29/2020   HGB 8.2 (L) 04/25/2020   HGB 8.9 (L) 04/24/2020   Since her last visit, she underwent lumbar spine MRI on 12/26/2019 for back pain showing: multilevel degenerative changes, worst at L4-5 where there is severe spinal canal stenosis; 13 cm fluid collection in right paracolic gutter.  She  proceeded to CT abdomen/pelvis without contrast on 01/07/2020 showing: no acute pathology; small non-obstructing right renal upper pole calculi, no hydronephrosis.  She also underwent bone density screening on 01/09/2020 showing a T-score of -3.8 at the ultra distal radius, which is considered severe osteoporosis.  She also underwent CT abdomen/pelvis with contrast on 01/28/2020 showing: previously described right paracolic gutter fluid collection corresponds to a dilated biliopancreatic limb, which is unchanged in appearance when compared to prior CT; unchanged punctate right nephrolithiasis.  She also underwent bilateral screening mammography with tomography at [facility] on 02/02/2020 showing: breast density category B; no evidence of malignancy in either breast.   Finally, she also underwent angio chest CT on 04/10/2020 showing: negative for pulmonary emboli, evidence of pulmonary hypertension; small bilateral pleural effusions.   REVIEW OF SYSTEMS: Jillian Eaton tells me she is feeling "much better".  She has more strength, better appetite, normal bowel function, no abdominal discomfort, and generally "feels good".  She is not yet exercising on a regular basis but does say she has less shortness of breath.  Her [place] had been in [place] while [place] was hospitalized he is now back home (she is his primary caregiver).  She is taking a multivitamin (bariatric) together with the vitamins ANC.  The one thing she is not replacing orally is the iron.  She cannot find one that she can tolerate   COVID 19 VACCINATION STATUS: Moderna x2, most recently 03/2019   HISTORY OF CURRENT ILLNESS: From the original intake note:  Jillian Eaton has a longstanding history of anemia and iron deficiency.  She has been followed by hematology several years ago.  She has had two separate  [facility]  Telephone:([phone] [phone]     ID: Jillian Eaton DOB: 07/11/1950  MR#: [id number]  JQB#:[id number]  Patient Care Team: [name], MD as PCP - General (Internal Medicine) [name] Nadean Corwin, MD as PCP - Cardiology (Cardiology) Evans Lance, MD as PCP - Electrophysiology (Cardiology) Carol Ada, MD as Consulting Physician (Gastroenterology) Chesley Mires, MD as Consulting Physician (Pulmonary Disease) Philemon Kingdom, MD as Consulting Physician (Internal Medicine) Justin Mend, MD as Attending Physician (Internal Medicine) Marzetta Board, DPM as Consulting Physician (Podiatry) Servando Salina, MD as Consulting Physician (Obstetrics and Gynecology) Paralee Cancel, MD as Consulting Physician (Orthopedic Surgery) Cameron Sprang, MD as Consulting Physician (Neurology) Chauncey Cruel, MD OTHER MD:  CHIEF COMPLAINT: multifactorial anemia  CURRENT TREATMENT: IV iron, [ b12 injections, oral folate]; denosumab/Prolia   INTERVAL HISTORY: Jillian Eaton returns today for follow up of her anemia.  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  These are being replaced   Since the last visit she went off [place].  We thought that might be affecting the hemoglobin although going off does not seem to have made much difference.  She does say that she wanted to get off the medication anyway and that she has not noticed any change clinically herself going off the medication.    Lab Results  Component Value Date   HGB 8.1 (L) 05/20/2020   HGB 7.9 (L) 05/04/2020   HGB 7.9 (L) 04/29/2020   HGB 8.2 (L) 04/25/2020   HGB 8.9 (L) 04/24/2020   Since her last visit, she underwent lumbar spine MRI on 12/26/2019 for back pain showing: multilevel degenerative changes, worst at L4-5 where there is severe spinal canal stenosis; 13 cm fluid collection in right paracolic gutter.  She  proceeded to CT abdomen/pelvis without contrast on 01/07/2020 showing: no acute pathology; small non-obstructing right renal upper pole calculi, no hydronephrosis.  She also underwent bone density screening on 01/09/2020 showing a T-score of -3.8 at the ultra distal radius, which is considered severe osteoporosis.  She also underwent CT abdomen/pelvis with contrast on 01/28/2020 showing: previously described right paracolic gutter fluid collection corresponds to a dilated biliopancreatic limb, which is unchanged in appearance when compared to prior CT; unchanged punctate right nephrolithiasis.  She also underwent bilateral screening mammography with tomography at [facility] on 02/02/2020 showing: breast density category B; no evidence of malignancy in either breast.   Finally, she also underwent angio chest CT on 04/10/2020 showing: negative for pulmonary emboli, evidence of pulmonary hypertension; small bilateral pleural effusions.   REVIEW OF SYSTEMS: Jillian Eaton tells me she is feeling "much better".  She has more strength, better appetite, normal bowel function, no abdominal discomfort, and generally "feels good".  She is not yet exercising on a regular basis but does say she has less shortness of breath.  Her [place] had been in [place] while [place] was hospitalized he is now back home (she is his primary caregiver).  She is taking a multivitamin (bariatric) together with the vitamins ANC.  The one thing she is not replacing orally is the iron.  She cannot find one that she can tolerate   COVID 19 VACCINATION STATUS: Moderna x2, most recently 03/2019   HISTORY OF CURRENT ILLNESS: From the original intake note:  Jillian Eaton has a longstanding history of anemia and iron deficiency.  She has been followed by hematology several years ago.  She has had two separate  [facility]  Telephone:([phone] [phone]     ID: Jillian Eaton DOB: 07/11/1950  MR#: [id number]  JQB#:[id number]  Patient Care Team: [name], MD as PCP - General (Internal Medicine) [name] Nadean Corwin, MD as PCP - Cardiology (Cardiology) Evans Lance, MD as PCP - Electrophysiology (Cardiology) Carol Ada, MD as Consulting Physician (Gastroenterology) Chesley Mires, MD as Consulting Physician (Pulmonary Disease) Philemon Kingdom, MD as Consulting Physician (Internal Medicine) Justin Mend, MD as Attending Physician (Internal Medicine) Marzetta Board, DPM as Consulting Physician (Podiatry) Servando Salina, MD as Consulting Physician (Obstetrics and Gynecology) Paralee Cancel, MD as Consulting Physician (Orthopedic Surgery) Cameron Sprang, MD as Consulting Physician (Neurology) Chauncey Cruel, MD OTHER MD:  CHIEF COMPLAINT: multifactorial anemia  CURRENT TREATMENT: IV iron, [ b12 injections, oral folate]; denosumab/Prolia   INTERVAL HISTORY: Jillian Eaton returns today for follow up of her anemia.  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  These are being replaced   Since the last visit she went off [place].  We thought that might be affecting the hemoglobin although going off does not seem to have made much difference.  She does say that she wanted to get off the medication anyway and that she has not noticed any change clinically herself going off the medication.    Lab Results  Component Value Date   HGB 8.1 (L) 05/20/2020   HGB 7.9 (L) 05/04/2020   HGB 7.9 (L) 04/29/2020   HGB 8.2 (L) 04/25/2020   HGB 8.9 (L) 04/24/2020   Since her last visit, she underwent lumbar spine MRI on 12/26/2019 for back pain showing: multilevel degenerative changes, worst at L4-5 where there is severe spinal canal stenosis; 13 cm fluid collection in right paracolic gutter.  She  proceeded to CT abdomen/pelvis without contrast on 01/07/2020 showing: no acute pathology; small non-obstructing right renal upper pole calculi, no hydronephrosis.  She also underwent bone density screening on 01/09/2020 showing a T-score of -3.8 at the ultra distal radius, which is considered severe osteoporosis.  She also underwent CT abdomen/pelvis with contrast on 01/28/2020 showing: previously described right paracolic gutter fluid collection corresponds to a dilated biliopancreatic limb, which is unchanged in appearance when compared to prior CT; unchanged punctate right nephrolithiasis.  She also underwent bilateral screening mammography with tomography at [facility] on 02/02/2020 showing: breast density category B; no evidence of malignancy in either breast.   Finally, she also underwent angio chest CT on 04/10/2020 showing: negative for pulmonary emboli, evidence of pulmonary hypertension; small bilateral pleural effusions.   REVIEW OF SYSTEMS: Jillian Eaton tells me she is feeling "much better".  She has more strength, better appetite, normal bowel function, no abdominal discomfort, and generally "feels good".  She is not yet exercising on a regular basis but does say she has less shortness of breath.  Her [place] had been in [place] while she was hospitalized he is now back home (she is his primary caregiver).  She is taking a multivitamin (bariatric) together with the vitamins ANC.  The one thing she is not replacing orally is the iron.  She cannot find one that she can tolerate   COVID 19 VACCINATION STATUS: Moderna x2, most recently 03/2019   HISTORY OF CURRENT ILLNESS: From the original intake note:  Jillian Eaton has a longstanding history of anemia and iron deficiency.  She has been followed by hematology several years ago.  She has had two separate

## 2020-05-20 ENCOUNTER — Other Ambulatory Visit: Payer: Self-pay

## 2020-05-20 ENCOUNTER — Other Ambulatory Visit: Payer: Self-pay | Admitting: *Deleted

## 2020-05-20 ENCOUNTER — Inpatient Hospital Stay: Payer: Medicare Other | Attending: Oncology

## 2020-05-20 ENCOUNTER — Telehealth: Payer: Self-pay | Admitting: Oncology

## 2020-05-20 ENCOUNTER — Other Ambulatory Visit: Payer: Self-pay | Admitting: Oncology

## 2020-05-20 ENCOUNTER — Inpatient Hospital Stay (HOSPITAL_BASED_OUTPATIENT_CLINIC_OR_DEPARTMENT_OTHER): Payer: Medicare Other | Admitting: Oncology

## 2020-05-20 VITALS — BP 121/72 | HR 87 | Temp 97.7°F | Resp 18 | Ht 67.0 in | Wt 178.5 lb

## 2020-05-20 DIAGNOSIS — E538 Deficiency of other specified B group vitamins: Secondary | ICD-10-CM | POA: Diagnosis not present

## 2020-05-20 DIAGNOSIS — D649 Anemia, unspecified: Secondary | ICD-10-CM | POA: Insufficient documentation

## 2020-05-20 DIAGNOSIS — D508 Other iron deficiency anemias: Secondary | ICD-10-CM

## 2020-05-20 DIAGNOSIS — C73 Malignant neoplasm of thyroid gland: Secondary | ICD-10-CM

## 2020-05-20 LAB — IRON AND TIBC
Iron: 33 ug/dL — ABNORMAL LOW (ref 41–142)
Saturation Ratios: 10 % — ABNORMAL LOW (ref 21–57)
TIBC: 333 ug/dL (ref 236–444)
UIBC: 300 ug/dL (ref 120–384)

## 2020-05-20 LAB — CBC WITH DIFFERENTIAL/PLATELET
Abs Immature Granulocytes: 0.01 10*3/uL (ref 0.00–0.07)
Basophils Absolute: 0.1 10*3/uL (ref 0.0–0.1)
Basophils Relative: 2 %
Eosinophils Absolute: 0.2 10*3/uL (ref 0.0–0.5)
Eosinophils Relative: 4 %
HCT: 26.6 % — ABNORMAL LOW (ref 36.0–46.0)
Hemoglobin: 8.1 g/dL — ABNORMAL LOW (ref 12.0–15.0)
Immature Granulocytes: 0 %
Lymphocytes Relative: 25 %
Lymphs Abs: 1.1 10*3/uL (ref 0.7–4.0)
MCH: 31.6 pg (ref 26.0–34.0)
MCHC: 30.5 g/dL (ref 30.0–36.0)
MCV: 103.9 fL — ABNORMAL HIGH (ref 80.0–100.0)
Monocytes Absolute: 0.4 10*3/uL (ref 0.1–1.0)
Monocytes Relative: 9 %
Neutro Abs: 2.5 10*3/uL (ref 1.7–7.7)
Neutrophils Relative %: 60 %
Platelets: 244 10*3/uL (ref 150–400)
RBC: 2.56 MIL/uL — ABNORMAL LOW (ref 3.87–5.11)
RDW: 16 % — ABNORMAL HIGH (ref 11.5–15.5)
WBC: 4.2 10*3/uL (ref 4.0–10.5)
nRBC: 0 % (ref 0.0–0.2)

## 2020-05-20 LAB — FOLATE: Folate: 21.2 ng/mL (ref >5.9)

## 2020-05-20 LAB — C-REACTIVE PROTEIN: CRP: <0.5 mg/dL (ref <1.0)

## 2020-05-20 LAB — SAVE SMEAR(SSMR), FOR PROVIDER SLIDE REVIEW

## 2020-05-20 LAB — SEDIMENTATION RATE: Sed Rate: 46 mm/hr — ABNORMAL HIGH (ref 0–22)

## 2020-05-20 LAB — RETICULOCYTES
Immature Retic Fract: 18.6 % — ABNORMAL HIGH (ref 2.3–15.9)
RBC.: 2.55 MIL/uL — ABNORMAL LOW (ref 3.87–5.11)
Retic Count, Absolute: 62.7 10*3/uL (ref 19.0–186.0)
Retic Ct Pct: 2.5 % (ref 0.4–3.1)

## 2020-05-20 LAB — FERRITIN: Ferritin: 33 ng/mL (ref 11–307)

## 2020-05-20 LAB — VITAMIN B12: Vitamin B-12: 896 pg/mL (ref 180–914)

## 2020-05-20 NOTE — Progress Notes (Signed)
Addendum: I called the patient with her iron studies which are low.  She will receive Feraheme x2 and we will try to schedule her for April 8 and 15.

## 2020-05-20 NOTE — Telephone Encounter (Signed)
Scheduled appts per 3/31 sch msg. Pt aware.  

## 2020-05-20 NOTE — Addendum Note (Signed)
Addended by: Sharlynn Oliphant A on: 05/20/2020 01:29 PM   Modules accepted: Orders

## 2020-05-22 ENCOUNTER — Other Ambulatory Visit: Payer: Self-pay | Admitting: Pulmonary Disease

## 2020-05-22 DIAGNOSIS — Z23 Encounter for immunization: Secondary | ICD-10-CM | POA: Diagnosis not present

## 2020-05-27 ENCOUNTER — Inpatient Hospital Stay: Payer: Medicare Other

## 2020-05-28 ENCOUNTER — Other Ambulatory Visit: Payer: Self-pay

## 2020-05-28 ENCOUNTER — Inpatient Hospital Stay: Payer: Medicare Other | Attending: Oncology

## 2020-05-28 VITALS — BP 135/82 | HR 82 | Temp 98.4°F | Resp 18

## 2020-05-28 DIAGNOSIS — D508 Other iron deficiency anemias: Secondary | ICD-10-CM

## 2020-05-28 DIAGNOSIS — D509 Iron deficiency anemia, unspecified: Secondary | ICD-10-CM | POA: Diagnosis not present

## 2020-05-28 MED ORDER — SODIUM CHLORIDE 0.9 % IV SOLN
510.0000 mg | Freq: Once | INTRAVENOUS | Status: AC
  Administered 2020-05-28: 510 mg via INTRAVENOUS
  Filled 2020-05-28: qty 510

## 2020-05-28 MED ORDER — SODIUM CHLORIDE 0.9 % IV SOLN
Freq: Once | INTRAVENOUS | Status: AC
  Filled 2020-05-28: qty 250

## 2020-05-28 NOTE — Patient Instructions (Signed)
Ferumoxytol injection What is this medicine? FERUMOXYTOL is an iron complex. Iron is used to make healthy red blood cells, which carry oxygen and nutrients throughout the body. This medicine is used to treat iron deficiency anemia. This medicine may be used for other purposes; ask your health care provider or pharmacist if you have questions. COMMON BRAND NAME(S): Feraheme What should I tell my health care provider before I take this medicine? They need to know if you have any of these conditions:  anemia not caused by low iron levels  high levels of iron in the blood  magnetic resonance imaging (MRI) test scheduled  an unusual or allergic reaction to iron, other medicines, foods, dyes, or preservatives  pregnant or trying to get pregnant  breast-feeding How should I use this medicine? This medicine is for injection into a vein. It is given by a health care professional in a hospital or clinic setting. Talk to your pediatrician regarding the use of this medicine in children. Special care may be needed. Overdosage: If you think you have taken too much of this medicine contact a poison control center or emergency room at once. NOTE: This medicine is only for you. Do not share this medicine with others. What if I miss a dose? It is important not to miss your dose. Call your doctor or health care professional if you are unable to keep an appointment. What may interact with this medicine? This medicine may interact with the following medications:  other iron products This list may not describe all possible interactions. Give your health care provider a list of all the medicines, herbs, non-prescription drugs, or dietary supplements you use. Also tell them if you smoke, drink alcohol, or use illegal drugs. Some items may interact with your medicine. What should I watch for while using this medicine? Visit your doctor or healthcare professional regularly. Tell your doctor or healthcare  professional if your symptoms do not start to get better or if they get worse. You may need blood work done while you are taking this medicine. You may need to follow a special diet. Talk to your doctor. Foods that contain iron include: whole grains/cereals, dried fruits, beans, or peas, leafy green vegetables, and organ meats (liver, kidney). What side effects may I notice from receiving this medicine? Side effects that you should report to your doctor or health care professional as soon as possible:  allergic reactions like skin rash, itching or hives, swelling of the face, lips, or tongue  breathing problems  changes in blood pressure  feeling faint or lightheaded, falls  fever or chills  flushing, sweating, or hot feelings  swelling of the ankles or feet Side effects that usually do not require medical attention (report to your doctor or health care professional if they continue or are bothersome):  diarrhea  headache  nausea, vomiting  stomach pain This list may not describe all possible side effects. Call your doctor for medical advice about side effects. You may report side effects to FDA at 1-800-FDA-1088. Where should I keep my medicine? This drug is given in a hospital or clinic and will not be stored at home. NOTE: This sheet is a summary. It may not cover all possible information. If you have questions about this medicine, talk to your doctor, pharmacist, or health care provider.  2021 Elsevier/Gold Standard (2016-03-27 20:21:10)  

## 2020-06-03 ENCOUNTER — Ambulatory Visit: Payer: Medicare Other | Admitting: Oncology

## 2020-06-03 ENCOUNTER — Other Ambulatory Visit: Payer: Self-pay | Admitting: Internal Medicine

## 2020-06-04 ENCOUNTER — Inpatient Hospital Stay: Payer: Medicare Other

## 2020-06-04 ENCOUNTER — Other Ambulatory Visit: Payer: Self-pay

## 2020-06-04 VITALS — BP 111/68 | HR 62 | Resp 17

## 2020-06-04 DIAGNOSIS — D508 Other iron deficiency anemias: Secondary | ICD-10-CM

## 2020-06-04 DIAGNOSIS — D509 Iron deficiency anemia, unspecified: Secondary | ICD-10-CM | POA: Diagnosis not present

## 2020-06-04 MED ORDER — SODIUM CHLORIDE 0.9 % IV SOLN
510.0000 mg | Freq: Once | INTRAVENOUS | Status: AC
  Administered 2020-06-04: 510 mg via INTRAVENOUS
  Filled 2020-06-04: qty 510

## 2020-06-04 MED ORDER — SODIUM CHLORIDE 0.9 % IV SOLN
Freq: Once | INTRAVENOUS | Status: DC
  Filled 2020-06-04: qty 250

## 2020-06-04 NOTE — Patient Instructions (Signed)
Ferumoxytol injection What is this medicine? FERUMOXYTOL is an iron complex. Iron is used to make healthy red blood cells, which carry oxygen and nutrients throughout the body. This medicine is used to treat iron deficiency anemia. This medicine may be used for other purposes; ask your health care provider or pharmacist if you have questions. COMMON BRAND NAME(S): Feraheme What should I tell my health care provider before I take this medicine? They need to know if you have any of these conditions:  anemia not caused by low iron levels  high levels of iron in the blood  magnetic resonance imaging (MRI) test scheduled  an unusual or allergic reaction to iron, other medicines, foods, dyes, or preservatives  pregnant or trying to get pregnant  breast-feeding How should I use this medicine? This medicine is for injection into a vein. It is given by a health care professional in a hospital or clinic setting. Talk to your pediatrician regarding the use of this medicine in children. Special care may be needed. Overdosage: If you think you have taken too much of this medicine contact a poison control center or emergency room at once. NOTE: This medicine is only for you. Do not share this medicine with others. What if I miss a dose? It is important not to miss your dose. Call your doctor or health care professional if you are unable to keep an appointment. What may interact with this medicine? This medicine may interact with the following medications:  other iron products This list may not describe all possible interactions. Give your health care provider a list of all the medicines, herbs, non-prescription drugs, or dietary supplements you use. Also tell them if you smoke, drink alcohol, or use illegal drugs. Some items may interact with your medicine. What should I watch for while using this medicine? Visit your doctor or healthcare professional regularly. Tell your doctor or healthcare  professional if your symptoms do not start to get better or if they get worse. You may need blood work done while you are taking this medicine. You may need to follow a special diet. Talk to your doctor. Foods that contain iron include: whole grains/cereals, dried fruits, beans, or peas, leafy green vegetables, and organ meats (liver, kidney). What side effects may I notice from receiving this medicine? Side effects that you should report to your doctor or health care professional as soon as possible:  allergic reactions like skin rash, itching or hives, swelling of the face, lips, or tongue  breathing problems  changes in blood pressure  feeling faint or lightheaded, falls  fever or chills  flushing, sweating, or hot feelings  swelling of the ankles or feet Side effects that usually do not require medical attention (report to your doctor or health care professional if they continue or are bothersome):  diarrhea  headache  nausea, vomiting  stomach pain This list may not describe all possible side effects. Call your doctor for medical advice about side effects. You may report side effects to FDA at 1-800-FDA-1088. Where should I keep my medicine? This drug is given in a hospital or clinic and will not be stored at home. NOTE: This sheet is a summary. It may not cover all possible information. If you have questions about this medicine, talk to your doctor, pharmacist, or health care provider.  2021 Elsevier/Gold Standard (2016-03-27 20:21:10)  

## 2020-06-11 ENCOUNTER — Ambulatory Visit (INDEPENDENT_AMBULATORY_CARE_PROVIDER_SITE_OTHER): Payer: Medicare Other | Admitting: Internal Medicine

## 2020-06-11 ENCOUNTER — Encounter: Payer: Self-pay | Admitting: Internal Medicine

## 2020-06-11 ENCOUNTER — Other Ambulatory Visit: Payer: Self-pay

## 2020-06-11 VITALS — BP 112/80 | HR 86 | Ht 67.0 in | Wt 176.6 lb

## 2020-06-11 DIAGNOSIS — E559 Vitamin D deficiency, unspecified: Secondary | ICD-10-CM | POA: Diagnosis not present

## 2020-06-11 DIAGNOSIS — M810 Age-related osteoporosis without current pathological fracture: Secondary | ICD-10-CM | POA: Diagnosis not present

## 2020-06-11 DIAGNOSIS — C73 Malignant neoplasm of thyroid gland: Secondary | ICD-10-CM

## 2020-06-11 DIAGNOSIS — E890 Postprocedural hypothyroidism: Secondary | ICD-10-CM | POA: Diagnosis not present

## 2020-06-11 DIAGNOSIS — E162 Hypoglycemia, unspecified: Secondary | ICD-10-CM | POA: Diagnosis not present

## 2020-06-11 LAB — TSH: TSH: 6.33 u[IU]/mL — ABNORMAL HIGH (ref 0.35–4.50)

## 2020-06-11 LAB — T4, FREE: Free T4: 0.89 ng/dL (ref 0.60–1.60)

## 2020-06-11 NOTE — Patient Instructions (Addendum)
Please continue: - Vitamin D 50,000 U weekly - Calcitriol 0.25 mcg 2x a day - calcium 1000 mg 2x daily  Also, continue levothyroxine 137 mcg daily.  Take the thyroid hormone every day, with water, at least 30 minutes before breakfast, separated by at least 4 hours from: - acid reflux medications - calcium - iron - multivitamins  Please stop at the lab.  Please come back for a follow-up appointment in 3 months.

## 2020-06-11 NOTE — Progress Notes (Signed)
Patient ID: Jillian Eaton, female   DOB: [birth year], 70 y.o.   MRN: [id number]   This visit occurred during the SARS-CoV-2 public health emergency.  Safety protocols were in place, including screening questions prior to the visit, additional usage of staff PPE, and extensive cleaning of exam room while observing appropriate contact time as indicated for disinfecting solutions.   HPI  Jillian Eaton is a 70 y.o.-year-old female, initially referred by her PCP, Dr. Sabas Sous, returning for h/o thyroid microcarcinoma,  postsurgical hypothyroidism, postsurgical hypocalcemia, osteoporosis, and hypoglycemia. Last visit 4 months ago.  Interim history: Since last visit, she did not experience any more hypoglycemia: 60-88 in am (she is not diabetic); 97  at bedtime  Her dysphagia and GERD resolved. She did not gain weight since then but she feels that her legs were more edematous.  She took Lasix and immediately lost 6 pounds.  No shortness of breath.  Reviewed history from before last visit: -Patient was admitted to the hospital (04/21/2020) for shortness of breath, fatigue, orthostatic hypotension in the setting of severe malnutrition.  She was found to have multiple nutrient deficiencies.  She had investigation by cardiology and pulmonary without new pathology found.  However, she was found to be hypoglycemic and her hypoglycemia persisted throughout her hospitalization.  Lowest blood sugars in the 30s.  This happened throughout the day and also at night.  She was discharged with blood sugars in the 50s.  After discharge, she did very well -sugars mostly 60s to 100, occasionally slightly higher, and few values in the 50s.  -Before our visit from 12/2019, she had a 20 pound weight loss in the previous month.  An MRI showed a large pericolic fluid collection.  She had a CT scan before last visit and this did not show any intra-abdominal pathology to account for this collection.  She had  another contrasted CT scan while in the hospital in 04/2020 With essentially the same results.  The adrenal glands and pancreas did not show any pathology.  Reviewed hypoglycemia work-up in the hospital:  Sulfonylurea screen was negative, insulin antibodies were slightly elevated, however, the insulin level was not very high.  In fact, closer to discharge, this has decreased to under 2. Her pro insulin was not very high.  C-peptide was detectable but decreased.  Beta hydroxybutyrate was low.  No signs of adrenal insufficiency-cosyntropin stimulation test was normal: Component     Latest Ref Rng & Units 04/23/2020  Cortisol, Base     ug/dL 6.5  Cortisol, 30 Min     ug/dL 16.7  Cortisol, 60 Min     ug/dL 20.0   In the hospital, she was evaluated by nutrition and placed on frequent feedings.  She was sent home with a glucometer.  She denied the use of: -Methimazole in the past -Alpha-lipoic acid  Glucometer: One Touch Verio Flex  She had a very low blood sugar, at 29 on CMP from 05/08/2018 (see below).  She was not symptomatic at that time. She also had a seizure in 05/2018, believed to be due to stopping Lyrica but now retrospectively -?  If caused by hypoglycemia.  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 Fredirick Maudlin at [facility].  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  She developed postsurgical hypothyroidism and, unfortunately, also postsurgical hypocalcemia, both uncontrolled.  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 Benign hyperplastic thyroid tissue. See comment. COMMENT: The papillary carcinoma appears to be incidental.  On 06/19/2017 she developed atrial fibrillation.  At that time, her  TSH is very suppressed, at 0.07.  She was previously on 300 mcg levothyroxine daily, gradually decreased-currently on 137 mcg daily (dose increased 06/2019): - in am - fasting - at least 45 min from b'fast - + calcium now changed to twice a day - no iron (had 2 iron infusions since last OV as she does have IDA) - + multivitamins in am but 4h after LT4 - no PPIs - not on Biotin  Reviewed her TFTs: Lab Results  Component Value Date   TSH 4.487 04/22/2020   TSH 1.70 01/08/2020   TSH 0.53 08/20/2019   TSH 0.13 (L) 07/08/2019   TSH 7.71 (H) 06/13/2019   TSH 0.05 (L) 01/06/2019   TSH 1.21 11/27/2017   TSH 0.034 (L) 09/21/2017   TSH 1.48 08/03/2017   TSH 0.007 (L) 06/19/2017   FREET4 1.11 01/08/2020   FREET4 1.11 08/20/2019   FREET4 1.58 07/08/2019   FREET4 1.47 01/06/2019   FREET4 1.41 11/27/2017   FREET4 2.87 (H) 09/21/2017   FREET4 1.04 08/03/2017   FREET4 2.43 (H) 06/19/2017   FREET4 1.04 08/29/2016   T3FREE 1.0 (L) 06/14/2019  05/08/2018: TSH 2.87  Pt denies: - feeling nodules in neck - hoarseness - choking - SOB with lying down But she had chronic dysphagia >> resolved since last OV.  No FH of thyroid cancer. No h/o radiation tx to head or neck.  No herbal supplements. No Biotin use. + recent steroids use -Solu-Medrol in the hospital.    Hypocalcemia:  She developed iatrogenic hypocalcemia after her thyroid surgery.  Her PTH was inappropriately normal, possibly related to her CKD.  She was admitted with hypocalcemia and acute respiratory failure on 07/10/2017.  She again had hypocalcemia at her recent admission in 04/2019 along with SOB, fatigue.  Reviewed pertinent labs: 01/08/2020: Ionized calcium 4.7 08/20/2019: Ionized calcium 4.09 (4.8-5.6) Lab Results  Component Value Date   PTH 40 11/27/2017   PTH 44 08/03/2017   PTH 65 07/10/2017   PTH Comment 07/10/2017   CALCIUM 8.0 (L) 05/04/2020   CALCIUM 7.8 (L) 04/29/2020   CALCIUM 7.5 (L) 04/26/2020    CALCIUM 7.0 (L) 04/25/2020   CALCIUM 6.9 (L) 04/24/2020   CALCIUM 6.7 (L) 04/23/2020   CALCIUM 6.3 (LL) 04/22/2020   CALCIUM 6.9 (L) 04/21/2020   CALCIUM 6.6 (LL) 04/14/2020   CALCIUM 6.0 (L) 04/02/2020  10/09/2017: Ca 8.1 (8.7-10.3)  She has a history of low magnesium and high phosphorus.  Magnesium was normal at last check: Lab Results  Component Value Date   MG 1.7 05/04/2020   MG 1.7 04/29/2020   MG 1.6 (L) 04/22/2020   MG 2.0 06/19/2019   MG 2.1 06/18/2019   MG 2.2 06/17/2019   MG 1.8 06/16/2019   MG 1.7 06/05/2019   MG 1.4 (L) 12/07/2018   MG 1.6 11/27/2017   MG 1.5 08/03/2017   MG 2.1 07/15/2017   MG 1.6 (L) 07/14/2017   MG 1.3 (L) 07/13/2017   MG 1.2 (L) 07/10/2017   MG 1.8 07/11/2016   MG 2.2 07/09/2016   MG 1.6 (L) 07/09/2016   MG 1.9 07/08/2016   MG 2.0 07/08/2016  10/09/2017: Mg 2.0 (1.6-2.3)  Lab Results  Component Value Date   PHOS 3.2 06/16/2019  PHOS 3.8 11/27/2017   PHOS 6.3 (H) 08/03/2017   PHOS 7.4 (H) 07/10/2017   PHOS 3.6 07/11/2016   PHOS 4.0 07/09/2016   PHOS 3.5 07/09/2016   PHOS 5.2 (H) 07/08/2016   PHOS 6.6 (H) 07/08/2016   PHOS 8.0 (H) 07/08/2016   PHOS 3.6 04/25/2016   PHOS 3.5 04/25/2016   PHOS 3.3 04/24/2016   PHOS 3.7 03/12/2016   PHOS 6.2 (H) 03/10/2016  10/09/2017: phos 4.5 (2.5-4.5)  05/08/2018: -CMP: Glucose 29 (!),  BUN/creatinine 19/0.94, GFR 74, calcium 8.3 (8.7-10.3), Albumin 3.6 (3.8-4.8) -Vitamin D 39.8 -TSH 2.87 -Phosphorus 3.6 (3-4.3) -Magnesium 1.6 (1.6-2.3)  -Ferritin 29 (15-150), hemoglobin 11.1 (11.1-15.9) Corrected calcium: 8.62, slightly lower than normal, which is our target. I was surprised by her very low glucose (possibly lab artifact as she did not have any symptoms at the time of the blood draw).  She does not have a history of diabetes but has steroid-induced hyperglycemia...   She is on the following regimen: -  >> she stopped taking it in 08/2018 (!!) >> restarted 12/2018: Now takes 2000 mg with  D - high-dose vitamin D 50,000 units weekly - calcitriol 0.5 mcg 2x a day (she did not decrease to 0.25 mcg 2x a day as advised at last visit, and she forgot) >> 0.25 mcg daily >> 0.25 mcg twice a day  We also started HCTZ 25 mg daily.  In the meantime, we reduced her Lasix to 50% of her previous dose.  Now taking it as needed.  She did not take it since 09/2019.  She denies hand cramping or perioral numbness.  History of vitamin D deficiency.    Vitamin D levels were normal: Lab Results  Component Value Date   VD25OH 58.6 01/08/2020   VD25OH 65.6 07/08/2019   VD25OH 107.02 (H) 06/16/2019   VD25OH 63.90 01/06/2019   VD25OH 45.84 11/27/2017   VD25OH 43.9 07/14/2017  05/08/2018: Vitamin D 39.8  Calcitriol level was not elevated: Component     Latest Ref Rng & Units 11/27/2017  Vitamin D 1, 25 (OH) Total     18 - 72 pg/mL 54  Vitamin D3 1, 25 (OH)     pg/mL 30  Vitamin D2 1, 25 (OH)     pg/mL 24   Osteoporosis:  Reviewed previous DXA scan reports: DXA - [facility] (01/09/2020) Lumbar spine L1-L4 (L2) Femoral neck (FN) 33% distal radius Ultra distal radius  T-score -0.9 RFN: -1.1 LFN: -1.8 -2.0 -3.8  Change in BMD from previous DXA test (%)  +8.9%* RFN: n/a LFN: +21.9%* n/a  -4.5%  (*) statistically significant  DXA  - [facility] (12/10/2017) Lumbar spine L1-L4 (L2) Femoral neck (FN) 33% distal left radius Ultra distal left radius  T-score -1.6 RFN: N/a LFN: -2.8  -2.3  -3.5   She is on Prolia: 01/03/2018 07/16/2018 01/21/2019 08/05/2019 02/04/2020  She tolerates this well.  No jaw, hip, thigh pain.  + CKD. Last BUN/Cr: Lab Results  Component Value Date   BUN 32 (H) 05/04/2020   CREATININE 1.03 (H) 05/04/2020   She surgery for torn rotator cuffs in both shoulders. She has atrial fibrillation and is s/p cardioversion. She was on amiodarone before, now off. She was admitted 07/2018 with SBO and 11/2018 with acute on chronic respiratory failure and pulmonary  hypertension. She had right TKR 02/18/2019.  She had left TKR 11/14/2018. She had a seizure 05/2018.  She was suspected for stroke, but this was ruled out. Of note, upon questioning, she  stopped Lyrica 1 week prior to the seizure.  She was taking this for fibromyalgia.  She was started on Keppra. She has a history of kidney stones and sees urology.  She had cystoscopy with stent placement 07/2019.  CT scan showed a 3 mm kidney stone in the right kidney upper pole -this is nonobstructing. She also continues to see Dr. Jana Hakim for her anemia.  She feels much better after she started Feraheme in August. She sees Dr. Benson Norway with GI.  ROS: Constitutional: no weight gain/no weight loss, no fatigue, no subjective hyperthermia, no subjective hypothermia Eyes: no blurry vision, no xerophthalmia ENT: no sore throat, + see HPI, + B LE edema Cardiovascular: no CP/no SOB/no palpitations/no leg swelling Respiratory: no cough/no SOB/no wheezing Gastrointestinal: no N/no V/no D/no C/no acid reflux Musculoskeletal: no muscle aches/no joint aches Skin: no rashes, no hair loss Neurological: no tremors/no numbness/no tingling/no dizziness  I reviewed pt's medications, allergies, PMH, social hx, family hx, and changes were documented in the history of present illness. Otherwise, unchanged from my initial visit note.   Past Medical History:  Diagnosis Date  . Anemia    iron and pernicious  . Arthritis   . Asthma   . Cancer ([place])   . CHF (congestive heart failure) ([place]) 2018  . Complication of anesthesia    BP dropped   . DOE (dyspnea on exertion)    2D ECHO, 02/12/2012 - EF 60-65%, moderate concentric hypertrophy  . Dyspnea   . Dysrhythmia    a-fib  . Fibromyalgia    nerve pain"left side at waist level" "can't lay on that side without pain" , "HOB elevation helps"; pt. thinks this has resolved after 200lb weight loss9-17- since i lost the wieght , i dont  think i have this anymore   . Headache 04/2019   . Heart murmur   . Hematuria - cause not known    resolved   . History of COVID-19   . History of kidney stones    x 2 '13, '14 surgery to remove  . Hypertension   . Pneumonia    aspiration  04/2016  . Presence of permanent cardiac pacemaker   . SBO (small bowel obstruction) ([place])    rqueired admission 2020  . Thyroid disease    "goiter"  . Transfusion history    10 yrs+   Past Surgical History:  Procedure Laterality Date  . ABDOMINAL ADHESION SURGERY  2004   open LOA - in [place]  . AV NODE ABLATION N/A 06/20/2019   Procedure: AV NODE ABLATION;  Surgeon: Evans Lance, MD;  Location: [place];  Service: Cardiovascular;  Laterality: N/A;  . BALLOON DILATION N/A 07/25/2019   Procedure: BALLOON DILATION;  Surgeon: Carol Ada, MD;  Location: [facility];  Service: Endoscopy;  Laterality: N/A;  . CARDIAC CATHETERIZATION  04/04/2010   No significant obstructive coronary artery disease  . CARDIOVERSION N/A 08/08/2017   Procedure: CARDIOVERSION;  Surgeon: Pixie Casino, MD;  Location: [facility] END[facility]COPY;  Service: Cardiovascular;  Laterality: N/A;  . CARDIOVERSION N/A 06/18/2019   Procedure: CARDIOVERSION;  Surgeon: Pixie Casino, MD;  Location: [facility];  Service: Cardiovascular;  Laterality: N/A;  . CHOLECYSTECTOMY  1990  . COLONOSCOPY W/ POLYPECTOMY    . COLONOSCOPY WITH PROPOFOL N/A 04/10/2014   Procedure: COLONOSCOPY WITH PROPOFOL;  Surgeon: Beryle Beams, MD;  Location: [facility];  Service: Endoscopy;  Laterality: N/A;  . CYSTOSCOPY/URETEROSCOPY/HOLMIUM LASER/STENT PLACEMENT Right 08/13/2019   Procedure: CYSTOSCOPY/RETROGRADE/URETEROSCOPY/HOLMIUM LASER/STENT  PLACEMENT;  Surgeon: Ceasar Mons, MD;  Location: [facility];  Service: Urology;  Laterality: Right;  ONLY NEEDS 45 MIN  . ESOPHAGOGASTRODUODENOSCOPY (EGD) WITH PROPOFOL N/A 07/25/2019   Procedure: ESOPHAGOGASTRODUODENOSCOPY (EGD) WITH PROPOFOL;  Surgeon: Carol Ada, MD;  Location: [facility];   Service: Endoscopy;  Laterality: N/A;  . EYE SURGERY     lasik 20-25 yrs ago  . GASTROPLASTY VERTICAL BANDED  1985  . [place]   In [place] for SBO  . MALONEY DILATION  07/25/2019   Procedure: Venia Minks DILATION;  Surgeon: Carol Ada, MD;  Location: [facility];  Service: Endoscopy;;  . PACEMAKER IMPLANT N/A 06/20/2019   Procedure: PACEMAKER IMPLANT;  Surgeon: Evans Lance, MD;  Location: [place] [facility];  Service: Cardiovascular;  Laterality: N/A;  . REVERSE SHOULDER ARTHROPLASTY Right 03/29/2018   Procedure: REVERSE SHOULDER ARTHROPLASTY;  Surgeon: Netta Cedars, MD;  Location: [place];  Service: Orthopedics;  Laterality: Right;  . RIGHT/LEFT HEART CATH AND CORONARY ANGIOGRAPHY N/A 04/23/2020   Procedure: RIGHT/LEFT HEART CATH AND CORONARY ANGIOGRAPHY;  Surgeon: Jolaine Artist, MD;  Location: [place];  Service: Cardiovascular;  Laterality: N/A;  . ROUX-EN-Y GASTRIC BYPASS  1992   Conversion VBG to RnYGB in [place], [place]  . SHOULDER ARTHROSCOPY WITH SUBACROMIAL DECOMPRESSION  2016   Dr Berenice Primas, [place], [place]  . SMALL INTESTINE SURGERY  2000   SBO - LOA w SB resection  . TOTAL KNEE ARTHROPLASTY Left 11/14/2018   Procedure: TOTAL KNEE ARTHROPLASTY;  Surgeon: Paralee Cancel, MD;  Location: [facility];  Service: Orthopedics;  Laterality: Left;  70 mins  . TOTAL KNEE ARTHROPLASTY Right 04/29/2019   Procedure: TOTAL KNEE ARTHROPLASTY;  Surgeon: Paralee Cancel, MD;  Location: [facility];  Service: Orthopedics;  Laterality: Right;  70 mins  . TOTAL THYROIDECTOMY  06/26/2012   Dr Celine Ahr, [facility]  . TUBAL LIGATION  1986  . URETHRAL DILATION  2012   w cystoscopy.  Dr Gaynelle Arabian  . UTERINE FIBROID EMBOLIZATION     Social History   Socioeconomic History  . Marital status: Married    Spouse name: Not on file  . Number of children: 3  . Years of education: Not on file  . Highest education level: Master's degree (e.g., MA, MS, MEng, MEd,  MSW, MBA)  Occupational History  . Occupation: retired  Tobacco Use  . Smoking status: Never Smoker  . Smokeless tobacco: Never Used  Vaping Use  . Vaping Use: Never used  Substance and Sexual Activity  . Alcohol use: No    Comment: wine occ  . Drug use: No    Types: Oxycodone    Comment: perscribed  . Sexual activity: Not Currently    Birth control/protection: None  Other Topics Concern  . Not on file  Social History Narrative   Right handed   1 cup of caffeine per day        Social Determinants of Health   Financial Resource Strain: Not on file  Food Insecurity: Not on file  Transportation Needs: Not on file  Physical Activity: Not on file  Stress: Not on file  Social Connections: Not on file  Intimate Partner Violence: Not on file   Current Outpatient Medications on File Prior to Visit  Medication Sig Dispense Refill  . acetaminophen (TYLENOL) 650 MG CR tablet Take 1,300 mg by mouth every 8 (eight) hours as needed for pain.    Marland Kitchen albuterol (VENTOLIN HFA) 108 (90 Base)  MCG/ACT inhaler Inhale 2 puffs into the lungs every 6 (six) hours as needed for wheezing or shortness of breath.    Marland Kitchen ascorbic acid (VITAMIN C) 500 MG tablet Take 1 tablet (500 mg total) by mouth 2 (two) times daily. 60 tablet 0  . blood glucose meter kit and supplies KIT Dispense based on patient and insurance preference. Use up to four times daily or as needed with symptoms of hypoglycemia. 1 each 0  . calcitRIOL (ROCALTROL) 0.25 MCG capsule Take 1 capsule (0.25 mcg total) by mouth 2 (two) times daily. 180 capsule 3  . cyanocobalamin (,VITAMIN B-12,) 1000 MCG/ML injection Inject 1 mL (1,000 mcg total) into the muscle every 30 (thirty) days. 1 mL 0  . cyclobenzaprine (FLEXERIL) 10 MG tablet Take 20 mg by mouth at bedtime.     Mariane Baumgarten Sodium (DSS) 100 MG CAPS Take 100 mg by mouth daily.    . DULoxetine (CYMBALTA) 60 MG capsule Take 1 capsule (60 mg total) by mouth daily. 30 capsule 0  . ELIQUIS 5 MG TABS  tablet TAKE 1 TABLET TWICE A DAY (Patient taking differently: Take 5 mg by mouth 2 (two) times daily.) 180 tablet 1  . eszopiclone (LUNESTA) 2 MG TABS tablet Take 1 tablet (2 mg total) by mouth at bedtime. 20 tablet 0  . famotidine (PEPCID) 20 MG tablet Take 20 mg by mouth daily as needed for heartburn or indigestion.    . furosemide (LASIX) 20 MG tablet Take 20 mg by mouth daily as needed for edema.    . Glucagon 3 MG/DOSE POWD Place 3 mg into the nose once as needed for up to 1 dose. 1 each 11  . levothyroxine (SYNTHROID) 137 MCG tablet TAKE 1 TABLET (137 MCG TOTAL) BY MOUTH DAILY BEFORE BREAKFAST. 90 tablet 2  . magnesium oxide (MAG-OX) 400 MG tablet TAKE 1 TABLET BY MOUTH EVERY DAY 90 tablet 3  . montelukast (SINGULAIR) 10 MG tablet Take 1 tablet (10 mg total) by mouth at bedtime. 90 tablet 2  . Multiple Vitamin (MULTIVITAMIN WITH MINERALS) TABS tablet Take 1 tablet by mouth 2 (two) times daily.    . potassium chloride (KLOR-CON) 10 MEQ tablet Take 10 mEq by mouth daily as needed (low potassium).    . Vitamin D, Ergocalciferol, (DRISDOL) 1.25 MG (50000 UT) CAPS capsule Take 1 capsule (50,000 Units total) by mouth every Wednesday. 4 capsule 0   No current facility-administered medications on file prior to visit.   Allergies  Allergen Reactions  . Oxycodone-Acetaminophen Itching, Nausea And Vomiting, Rash and Other (See Comments)    Other reaction(s): Unknown  . Hydrocodone Itching        Family History  Problem Relation Age of Onset  . Diabetes Mother   . Epilepsy Mother   . Cancer Mother        Breast  . Hypertension Mother   . Breast cancer Mother   . Seizures Mother   . Kidney disease Father   . Diabetes Father   . Hypertension Father   . Asthma Father   . Heart disease Father   . Epilepsy Sister   . Cancer Maternal Grandmother   . Breast cancer Maternal Grandmother   . Cancer Paternal Grandmother   . Breast cancer Paternal Grandmother     PE: BP 112/80 (BP Location:  Right Arm, Patient Position: Sitting, Cuff Size: Normal)   Pulse 86   Ht 5' 7"  (1.702 m)   Wt 176 lb 9.6 oz (80.1 kg)  SpO2 99%   BMI 27.66 kg/m  Wt Readings from Last 3 Encounters:  06/11/20 176 lb 9.6 oz (80.1 kg)  05/20/20 178 lb 8 oz (81 kg)  05/06/20 174 lb (78.9 kg)   Constitutional: overweight, in NAD Eyes: PERRLA, EOMI, no exophthalmos ENT: moist mucous membranes, no thyromegaly, no cervical lymphadenopathy Cardiovascular: RRR, No MRG, + LE moderate periankle edema bilaterally Respiratory: CTA B Gastrointestinal: abdomen soft, NT, ND, BS+ Musculoskeletal: no deformities, strength intact in all 4 Skin: moist, warm, no rashes Neurological: no tremor with outstretched hands, DTR normal in all 4  Assessment:  1. Hypoglycemia -Most likely due to malnutrition -Looking back at her CMP from 04/2018, I am wondering whether the glucose of 29 may not have been a lab error... Also, question if her seizure from 05/2018 was related to hypoglycemia...  2. Papillary thyroid cancer  3. Postsurgical hypothyroidism  4. Postsurgical  Hypocalcemia  5. Osteoporosis  6. Vitamin D deficiency   PLAN: 1.  Hypoglycemia -I reviewed previous investigation in the hospital and possible etiologies for her hypoglycemia.  Longstanding malnutrition due to depleted glycogen hepatic stores - however, she did have a higher C-peptide and insulin than expected in this situationHypoglycemia due to malnutrition is usually slow to resolve.  Also, patients can develop refeeding hypoglycemia after they start to eat, prolonging the condition.  She was in the hospital for 2 weeks.  After discharge, her blood sugars at home are now better, with only one value in the 50s, the rest 60s and up to 110.  At this visit, sugars are all within normal range.  History of gastric bypass -through multiple mechanisms including increase GLP 1 secretion, increase sensitivity to insulin, increased production of insulin due to  improved function  Adrenal insufficiency-however, she ruled out for this in the hospital  Autoimmune hypoglycemia Atilano Median disease - in the absence of previous insulin use) -she had mildly positive insulin antibodies, however, insulin levels were low, which is not consistent with this condition.  It is possible that her mildly positive insulin antibodies may be related to another autoimmune disease and not necessarily the pathogenic cause for her hypoglycemia at the moment.  Antibodies against insulin can appear after previous use of insulin, after methimazole or alpha-lipoic acid, but she has not used these agents in the past.  Treatment for this is usually small frequent meals.  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  Also, proinsulin or insulin level are not very elevated.  However, we may need to check an endoscopic ultrasound of her pancreas to detect smaller masses.  She sees Dr. Benson Norway and she had an endoscopy and colonoscopy planned, but she did not have this due to hospitalization.  I discussed with the patient that I do recommend that, if possible, an EUS be performed at the time of her EGD.  However, Dr. Benson Norway contacted me after patient's last visit that he could not perform EUS due to previous gastric bypass.  Nesidioblastosis after gastric bypass, this is extremely rare, and almost impossibly to differentiate from insulinoma -At last visit, we discussed about correction of hypoglycemia, and I sent the prescription for glucagon to her pharmacy and advised the patient how to use it.  Her husband has dementia and is blind and cannot help her.  She does have a neighbor who can help her.  However, she did not have any significant low episodes since last visit.   -At last visit I recommended 1  to 2 tablespoons of starch at bedtime to maintain blood sugars during the night.  She did this for a while, but afterwards she was able to stop with maintenance of normal blood  sugars during the night.  Her lowest blood sugar since last visit was 19, which I explained is normal for a person without diabetes. -She continues to work with nutrition -I do not think that she needs a CGM since this will alarm her constantly due to the baseline low blood sugars.  Also, at low glucose values, CGM is not very accurate. -Reviewed latest HbA1c: 4% -At last visit, we discussed that if her hypoglycemia does not improve, I may refer her to [facility] or [facility] for further investigation.  As of now, since sugars are much better, we can just continue to follow her expectantly.  2. PTC Patient had an incidentally found focus of micropapillary thyroid cancer, which was discovered after her thyroidectomy for enlarging nontoxic multinodular goiter.  Since the cancer was so small, this is most likely cured by her thyroidectomy. -No imaging follow-up is necessary.  We will continue to follow her clinically. -No neck compression symptoms except for chronic dysphagia -At last visit, we checked her ATA antibodies and her thyroglobulin and these were undetectable.  We will repeat them at next visit.  3. 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 - we discussed about taking the thyroid hormone every day, with water, >30 minutes before breakfast, separated by >4 hours from acid reflux medications, calcium, iron, multivitamins. Pt. is taking it correctly. - We will recheck her TFTs today  4. Patient with postoperative, iatrogenic hypocalcemia -In 2019, she had a very low blood calcium of 5.5.  Vitamin D level was normal.  She does have a history of magnesium deficiency and high phosphorus.  Restarted calcitriol and her calcium levels improved and her magnesium and phosphorus normalized.  Desirable calcium  levels are at the lower limit of normal or slightly higher -At the end of 2021, she was off calcium supplements for 4 months.  An ionized calcium was normal, 4.9 (4.8-5.6).  Afterwards, I recommended to start calcium tablets with dinner. -Latest ionized calcium was only slightly low, at 4.7 (4.8-5.6) in 12/2019, and latest total calcium was also slightly low, but phosphorus was normal: Lab Results  Component Value Date   CALCIUM 8.0 (L) 05/04/2020   PHOS 3.2 06/16/2019  -She is asymptomatic -Vitamin D level was normal at last check and we will recheck again today -She continues on calcium 1000 mg 2x a day, increased from last OV from 1500 mg at dinner -She also takes calcitriol 0.25 mcg twice a day -Currently, Natpara was retracted from the market due to presence of impurities -We will recheck her calcium, vitamin D, and also calcitriol level today -She would also want me to check a CMP to check kidney and albumin  5. Osteoporosis -Latest DEXA scan report was reviewed: Increased T-scores at the spine and left femoral neck.  Her lowest BMD was at the ultra distal radius. -We started Prolia in 12/2017 and she continues without side effects: No jaw/thigh/hip pain -She continues to have lower calcium levels due to her postsurgical hypoparathyroidism -Plan to continue Prolia for at least 6 years but we can continue for 10 years or  more if needed -No falls or fractures since last visit  6.  Vitamin D deficiency -Latest vitamin D level was normal in 06/2019 -She continues on high-dose ergocalciferol 50,000 units weekly -We will recheck the level today  Component     Latest Ref Rng & Units 06/11/2020  TSH     0.35 - 4.50 uIU/mL 6.33 (H)  T4,Free(Direct)     0.60 - 1.60 ng/dL 0.89  Her TSH is elevated.  This is probably related to her starting to take calcium twice a day, previously only taken at night.  However, patient confirmed that she is separating calcium by levothyroxine by at least 4  hours.  We will increase her levothyroxine dose to 150 mcg daily and recheck her test in 1.5 months.  Component     Latest Ref Rng & Units 06/11/2020          Glucose     65 - 99 mg/dL 60 (L)  BUN     7 - 25 mg/dL 32 (H)  Creatinine     0.50 - 0.99 mg/dL 0.83  GFR, Est Non African American     > OR = 60 mL/min/1.24m 72  GFR, Est African American     > OR = 60 mL/min/1.7104m84  BUN/Creatinine Ratio     6 - 22 (calc) 39 (H)  Sodium     135 - 146 mmol/L 143  Potassium     3.5 - 5.3 mmol/L 4.3  Chloride     98 - 110 mmol/L 110  CO2     20 - 32 mmol/L 25  Calcium     8.6 - 10.4 mg/dL 8.1 (L)  Total Protein     6.1 - 8.1 g/dL 6.0 (L)  Albumin MSPROF     3.6 - 5.1 g/dL 3.4 (L)  Globulin     1.9 - 3.7 g/dL (calc) 2.6  AG Ratio     1.0 - 2.5 (calc) 1.3  Total Bilirubin     0.2 - 1.2 mg/dL 0.3  Alkaline phosphatase (APISO)     37 - 153 U/L 56  AST     10 - 35 U/L 22  ALT     6 - 29 U/L 17  Calcium Ionized     4.8 - 5.6 mg/dL 4.64 (L)  Vitamin D, 25-Hydroxy     30.0 - 100.0 ng/mL 52.0  Vit D, 1,25-Dihydroxy     19.9 - 79.3 pg/mL 74.3   Ionized calcium is slightly low. Her albumin and total protein are also slightly low, as is her glucose. Since she is asymptomatic and calcium is close to the lower limit of normal, we can continue with her current regimen. I will recheck her ionized calcium when she comes back for the thyroid test.  CrPhilemon KingdomMD PhD [facility]

## 2020-06-12 LAB — CALCITRIOL (1,25 DI-OH VIT D): Vit D, 1,25-Dihydroxy: 74.3 pg/mL (ref 19.9–79.3)

## 2020-06-12 LAB — VITAMIN D 25 HYDROXY (VIT D DEFICIENCY, FRACTURES): Vit D, 25-Hydroxy: 52 ng/mL (ref 30.0–100.0)

## 2020-06-13 LAB — COMPLETE METABOLIC PANEL WITH GFR
AG Ratio: 1.3 (calc) (ref 1.0–2.5)
ALT: 17 U/L (ref 6–29)
AST: 22 U/L (ref 10–35)
Albumin: 3.4 g/dL — ABNORMAL LOW (ref 3.6–5.1)
Alkaline phosphatase (APISO): 56 U/L (ref 37–153)
BUN/Creatinine Ratio: 39 (calc) — ABNORMAL HIGH (ref 6–22)
BUN: 32 mg/dL — ABNORMAL HIGH (ref 7–25)
CO2: 25 mmol/L (ref 20–32)
Calcium: 8.1 mg/dL — ABNORMAL LOW (ref 8.6–10.4)
Chloride: 110 mmol/L (ref 98–110)
Creat: 0.83 mg/dL (ref 0.50–0.99)
GFR, Est African American: 84 mL/min/{1.73_m2} (ref >=60)
GFR, Est Non African American: 72 mL/min/{1.73_m2} (ref >=60)
Globulin: 2.6 g/dL (calc) (ref 1.9–3.7)
Glucose, Bld: 60 mg/dL — ABNORMAL LOW (ref 65–99)
Potassium: 4.3 mmol/L (ref 3.5–5.3)
Sodium: 143 mmol/L (ref 135–146)
Total Bilirubin: 0.3 mg/dL (ref 0.2–1.2)
Total Protein: 6 g/dL — ABNORMAL LOW (ref 6.1–8.1)

## 2020-06-13 LAB — CALCIUM, IONIZED: Calcium, Ion: 4.64 mg/dL — ABNORMAL LOW (ref 4.8–5.6)

## 2020-06-14 ENCOUNTER — Encounter: Payer: Self-pay | Admitting: Internal Medicine

## 2020-06-14 ENCOUNTER — Other Ambulatory Visit: Payer: Self-pay | Admitting: Internal Medicine

## 2020-06-14 DIAGNOSIS — R634 Abnormal weight loss: Secondary | ICD-10-CM | POA: Diagnosis not present

## 2020-06-14 DIAGNOSIS — R609 Edema, unspecified: Secondary | ICD-10-CM | POA: Diagnosis not present

## 2020-06-14 MED ORDER — LEVOTHYROXINE SODIUM 150 MCG PO TABS: 150.0000 ug | ORAL_TABLET | Freq: Every day | ORAL | 3 refills | Status: DC

## 2020-06-18 ENCOUNTER — Ambulatory Visit (INDEPENDENT_AMBULATORY_CARE_PROVIDER_SITE_OTHER): Payer: Medicare Other

## 2020-06-18 DIAGNOSIS — I4821 Permanent atrial fibrillation: Secondary | ICD-10-CM | POA: Diagnosis not present

## 2020-06-18 DIAGNOSIS — I442 Atrioventricular block, complete: Secondary | ICD-10-CM

## 2020-06-18 DIAGNOSIS — G40909 Epilepsy, unspecified, not intractable, without status epilepticus: Secondary | ICD-10-CM | POA: Diagnosis not present

## 2020-06-18 DIAGNOSIS — G4709 Other insomnia: Secondary | ICD-10-CM | POA: Diagnosis not present

## 2020-06-18 DIAGNOSIS — D519 Vitamin B12 deficiency anemia, unspecified: Secondary | ICD-10-CM | POA: Diagnosis not present

## 2020-06-18 DIAGNOSIS — N1832 Chronic kidney disease, stage 3b: Secondary | ICD-10-CM | POA: Diagnosis not present

## 2020-06-18 LAB — CUP PACEART REMOTE DEVICE CHECK
Battery Remaining Longevity: 137 mo
Battery Voltage: 3.07 V
Brady Statistic AP VP Percent: 0 %
Brady Statistic AP VS Percent: 0 %
Brady Statistic AS VP Percent: 99.89 %
Brady Statistic AS VS Percent: 0.11 %
Brady Statistic RA Percent Paced: 0 %
Brady Statistic RV Percent Paced: 99.89 %
Date Time Interrogation Session: 20220428224135
Implantable Lead Implant Date: 20210430
Implantable Lead Location: 753860
Implantable Lead Model: 3830
Implantable Pulse Generator Implant Date: 20210430
Lead Channel Impedance Value: 3382 Ohm
Lead Channel Impedance Value: 3382 Ohm
Lead Channel Impedance Value: 380 Ohm
Lead Channel Impedance Value: 570 Ohm
Lead Channel Pacing Threshold Amplitude: 0.625 V
Lead Channel Pacing Threshold Pulse Width: 0.4 ms
Lead Channel Sensing Intrinsic Amplitude: 15.875 mV
Lead Channel Sensing Intrinsic Amplitude: 15.875 mV
Lead Channel Setting Pacing Amplitude: 2.5 V
Lead Channel Setting Pacing Pulse Width: 0.4 ms
Lead Channel Setting Sensing Sensitivity: 4 mV

## 2020-06-28 ENCOUNTER — Encounter: Payer: Self-pay | Admitting: Internal Medicine

## 2020-07-02 ENCOUNTER — Encounter: Payer: Self-pay | Admitting: Podiatry

## 2020-07-02 ENCOUNTER — Ambulatory Visit (INDEPENDENT_AMBULATORY_CARE_PROVIDER_SITE_OTHER): Payer: Medicare Other | Admitting: Podiatry

## 2020-07-02 ENCOUNTER — Other Ambulatory Visit: Payer: Self-pay

## 2020-07-02 DIAGNOSIS — R630 Anorexia: Secondary | ICD-10-CM | POA: Insufficient documentation

## 2020-07-02 DIAGNOSIS — Z860100 Personal history of colon polyps, unspecified: Secondary | ICD-10-CM | POA: Insufficient documentation

## 2020-07-02 DIAGNOSIS — B351 Tinea unguium: Secondary | ICD-10-CM

## 2020-07-02 DIAGNOSIS — D510 Vitamin B12 deficiency anemia due to intrinsic factor deficiency: Secondary | ICD-10-CM

## 2020-07-02 DIAGNOSIS — M79674 Pain in right toe(s): Secondary | ICD-10-CM

## 2020-07-02 DIAGNOSIS — I499 Cardiac arrhythmia, unspecified: Secondary | ICD-10-CM | POA: Insufficient documentation

## 2020-07-02 DIAGNOSIS — M79675 Pain in left toe(s): Secondary | ICD-10-CM | POA: Diagnosis not present

## 2020-07-02 DIAGNOSIS — L84 Corns and callosities: Secondary | ICD-10-CM

## 2020-07-06 DIAGNOSIS — M47816 Spondylosis without myelopathy or radiculopathy, lumbar region: Secondary | ICD-10-CM | POA: Diagnosis not present

## 2020-07-08 NOTE — Progress Notes (Signed)
Remote pacemaker transmission.   

## 2020-07-09 NOTE — Progress Notes (Signed)
  Subjective:  Patient ID: Jillian Eaton, female    DOB: 1950/05/29,  MRN: [id number]  Hebert Soho Zody presents to clinic with h/o pernicious anemia today for painful corns b/l 5th toes and painful thick toenails that are difficult to trim. Pain interferes with ambulation. Aggravating factors include wearing enclosed shoe gear. Pain is relieved with periodic professional debridement.   She is accompanied by her husband on today's visit. She voices no new pedal concerns on today's visit.  PCP is Dr. Latanya Presser and last visit was one month ago per patient recall.  Allergies  Allergen Reactions  . Oxycodone-Acetaminophen Itching, Nausea And Vomiting, Rash and Other (See Comments)    Other reaction(s): Unknown Other reaction(s): Unknown  . Hydrocodone Itching         Review of Systems: Negative except as noted in the HPI. Objective:   Constitutional Nollie Gaynelle Bellisario is a pleasant 70 y.o. African American female, WD, WN in NAD. AAO x 3.   Vascular Capillary refill time to digits immediate b/l. Palpable pedal pulses b/l LE. Pedal hair absent. Lower extremity skin temperature gradient within normal limits. No pain with calf compression b/l. No edema noted b/l lower extremities. No cyanosis or clubbing noted.  Neurologic Normal speech. Oriented to person, place, and time. Protective sensation intact 5/5 intact bilaterally with 10g monofilament b/l.  Dermatologic Pedal skin with normal turgor, texture and tone bilaterally. No open wounds bilaterally. No interdigital macerations bilaterally. Toenails 1-5 b/l elongated, discolored, dystrophic, thickened, crumbly with subungual debris and tenderness to dorsal palpation. Hyperkeratotic lesion(s) L 5th toe and R 5th toe.  No erythema, no edema, no drainage, no fluctuance.  Orthopedic: Normal muscle strength 5/5 to all lower extremity muscle groups bilaterally. No pain crepitus or joint limitation noted with ROM b/l.  Hammertoe(s) noted to the L 5th toe and R 5th toe.   Radiographs: None Assessment:   1. Pain due to onychomycosis of toenails of both feet   2. Corns   3. Pernicious anemia    Plan:  Patient was evaluated and treated and all questions answered.  Onychomycosis with pain -Nails palliatively debridement as below -Educated on self-care  Procedure: Nail Debridement Rationale: Pain Type of Debridement: manual, sharp debridement. Instrumentation: Nail nipper, rotary burr. Number of Nails: 10 -Examined patient. -Patient to continue soft, supportive shoe gear daily. -Toenails 1-5 b/l were debrided in length and girth with sterile nail nippers and dremel without iatrogenic bleeding.  -Corn(s) L 5th toe and R 5th toe pared utilizing sterile scalpel blade without complication or incident. Total number debrided=2. -Patient to report any pedal injuries to medical professional immediately. -Patient/POA to call should there be question/concern in the interim.  Return in about 3 months (around 10/02/2020).  Marzetta Board, DPM

## 2020-07-11 ENCOUNTER — Encounter: Payer: Self-pay | Admitting: Oncology

## 2020-07-11 ENCOUNTER — Telehealth: Payer: Self-pay

## 2020-07-11 NOTE — Telephone Encounter (Signed)
Prolia VOB initiated via [email or web address]  Last OV: 06/11/20 Next OV:  Last Prolia inj: 02/04/20 Next Prolia inj DUE: 08/05/20

## 2020-07-12 ENCOUNTER — Inpatient Hospital Stay: Payer: Medicare Other | Attending: Oncology

## 2020-07-12 ENCOUNTER — Other Ambulatory Visit: Payer: Self-pay | Admitting: *Deleted

## 2020-07-12 ENCOUNTER — Encounter: Payer: Self-pay | Admitting: Oncology

## 2020-07-12 ENCOUNTER — Encounter: Payer: Self-pay | Admitting: Internal Medicine

## 2020-07-12 ENCOUNTER — Other Ambulatory Visit: Payer: Self-pay

## 2020-07-12 DIAGNOSIS — Z79899 Other long term (current) drug therapy: Secondary | ICD-10-CM | POA: Insufficient documentation

## 2020-07-12 DIAGNOSIS — Z9884 Bariatric surgery status: Secondary | ICD-10-CM | POA: Diagnosis not present

## 2020-07-12 DIAGNOSIS — E669 Obesity, unspecified: Secondary | ICD-10-CM

## 2020-07-12 DIAGNOSIS — D519 Vitamin B12 deficiency anemia, unspecified: Secondary | ICD-10-CM | POA: Diagnosis not present

## 2020-07-12 DIAGNOSIS — K909 Intestinal malabsorption, unspecified: Secondary | ICD-10-CM | POA: Diagnosis not present

## 2020-07-12 DIAGNOSIS — D638 Anemia in other chronic diseases classified elsewhere: Secondary | ICD-10-CM

## 2020-07-12 DIAGNOSIS — D696 Thrombocytopenia, unspecified: Secondary | ICD-10-CM

## 2020-07-12 DIAGNOSIS — M810 Age-related osteoporosis without current pathological fracture: Secondary | ICD-10-CM

## 2020-07-12 DIAGNOSIS — E538 Deficiency of other specified B group vitamins: Secondary | ICD-10-CM | POA: Insufficient documentation

## 2020-07-12 DIAGNOSIS — D509 Iron deficiency anemia, unspecified: Secondary | ICD-10-CM | POA: Diagnosis not present

## 2020-07-12 DIAGNOSIS — D508 Other iron deficiency anemias: Secondary | ICD-10-CM

## 2020-07-12 LAB — CBC WITH DIFFERENTIAL (CANCER CENTER ONLY)
Abs Immature Granulocytes: 0 10*3/uL (ref 0.00–0.07)
Basophils Absolute: 0.1 10*3/uL (ref 0.0–0.1)
Basophils Relative: 1 %
Eosinophils Absolute: 0.2 10*3/uL (ref 0.0–0.5)
Eosinophils Relative: 5 %
HCT: 32.1 % — ABNORMAL LOW (ref 36.0–46.0)
Hemoglobin: 10.1 g/dL — ABNORMAL LOW (ref 12.0–15.0)
Immature Granulocytes: 0 %
Lymphocytes Relative: 27 %
Lymphs Abs: 1 10*3/uL (ref 0.7–4.0)
MCH: 30.4 pg (ref 26.0–34.0)
MCHC: 31.5 g/dL (ref 30.0–36.0)
MCV: 96.7 fL (ref 80.0–100.0)
Monocytes Absolute: 0.4 10*3/uL (ref 0.1–1.0)
Monocytes Relative: 10 %
Neutro Abs: 2.2 10*3/uL (ref 1.7–7.7)
Neutrophils Relative %: 57 %
Platelet Count: 177 10*3/uL (ref 150–400)
RBC: 3.32 MIL/uL — ABNORMAL LOW (ref 3.87–5.11)
RDW: 14.7 % (ref 11.5–15.5)
WBC Count: 3.9 10*3/uL — ABNORMAL LOW (ref 4.0–10.5)
nRBC: 0 % (ref 0.0–0.2)

## 2020-07-12 LAB — CMP (CANCER CENTER ONLY)
ALT: 17 U/L (ref 0–44)
AST: 24 U/L (ref 15–41)
Albumin: 3.6 g/dL (ref 3.5–5.0)
Alkaline Phosphatase: 56 U/L (ref 38–126)
Anion gap: 6 (ref 5–15)
BUN: 25 mg/dL — ABNORMAL HIGH (ref 8–23)
CO2: 26 mmol/L (ref 22–32)
Calcium: 9.2 mg/dL (ref 8.9–10.3)
Chloride: 109 mmol/L (ref 98–111)
Creatinine: 0.88 mg/dL (ref 0.44–1.00)
GFR, Estimated: >60 mL/min (ref >60)
Glucose, Bld: 67 mg/dL — ABNORMAL LOW (ref 70–99)
Potassium: 4.2 mmol/L (ref 3.5–5.1)
Sodium: 141 mmol/L (ref 135–145)
Total Bilirubin: 0.4 mg/dL (ref 0.3–1.2)
Total Protein: 6.9 g/dL (ref 6.5–8.1)

## 2020-07-12 LAB — C-REACTIVE PROTEIN: CRP: 0.6 mg/dL (ref <1.0)

## 2020-07-12 LAB — FERRITIN: Ferritin: 43 ng/mL (ref 11–307)

## 2020-07-12 LAB — RETICULOCYTES
Immature Retic Fract: 11.2 % (ref 2.3–15.9)
RBC.: 3.32 MIL/uL — ABNORMAL LOW (ref 3.87–5.11)
Retic Count, Absolute: 18.9 10*3/uL — ABNORMAL LOW (ref 19.0–186.0)
Retic Ct Pct: 0.6 % (ref 0.4–3.1)

## 2020-07-12 LAB — VITAMIN B12: Vitamin B-12: 643 pg/mL (ref 180–914)

## 2020-07-12 LAB — VITAMIN D 25 HYDROXY (VIT D DEFICIENCY, FRACTURES): Vit D, 25-Hydroxy: 83.09 ng/mL (ref 30–100)

## 2020-07-12 LAB — FOLATE: Folate: 18.8 ng/mL (ref >5.9)

## 2020-07-12 LAB — SEDIMENTATION RATE: Sed Rate: 49 mm/hr — ABNORMAL HIGH (ref 0–22)

## 2020-07-12 LAB — SAVE SMEAR(SSMR), FOR PROVIDER SLIDE REVIEW

## 2020-07-13 ENCOUNTER — Other Ambulatory Visit: Payer: Self-pay

## 2020-07-13 ENCOUNTER — Other Ambulatory Visit: Payer: Self-pay | Admitting: Oncology

## 2020-07-13 ENCOUNTER — Encounter: Payer: Self-pay | Admitting: Oncology

## 2020-07-13 DIAGNOSIS — R700 Elevated erythrocyte sedimentation rate: Secondary | ICD-10-CM

## 2020-07-13 NOTE — Telephone Encounter (Signed)
FYI for VS  I had labs today orders by Dr Jana Hakim.  This was my response to him after seeing CMP results:   Did you see these numbers? (So happy)  My numbers since hospitalization in March have greatly improved and obviously part of the overall reason why my health is a 150% improved and I feel great!!  Not to take anything from Drs, but I have had every specialist there is; been sick for years; and it took a very bright RD [place] to solve the problem.  It was my nutrition. I'm so happy and give God the Glory, and the wonderful medical staff in my life.  I'm sorry to go on and on, but when the Hospitalist told me they did every test and could find nothing and that it was probably nutrition. In came [place] and saved/changed my life. A true miracle.   This will be part of the book I will write to help others like myself. I know there out there, I was. I can't prove it but I truly believe every illness/disease I've had can be attributed to my body not having the proper nutrients to maintain and stay healthy.  I'm through; see why I want to write a book? (Smile)

## 2020-07-13 NOTE — Progress Notes (Signed)
Referral to Dr. Leigh Aurora successfully faxed to [phone].

## 2020-07-14 ENCOUNTER — Other Ambulatory Visit: Payer: Self-pay

## 2020-07-14 ENCOUNTER — Encounter: Payer: Self-pay | Admitting: Internal Medicine

## 2020-07-14 ENCOUNTER — Other Ambulatory Visit (INDEPENDENT_AMBULATORY_CARE_PROVIDER_SITE_OTHER): Payer: Medicare Other

## 2020-07-14 DIAGNOSIS — E890 Postprocedural hypothyroidism: Secondary | ICD-10-CM | POA: Diagnosis not present

## 2020-07-14 LAB — TSH: TSH: 6.67 u[IU]/mL — ABNORMAL HIGH (ref 0.35–4.50)

## 2020-07-14 LAB — T4, FREE: Free T4: 0.87 ng/dL (ref 0.60–1.60)

## 2020-07-14 LAB — PHOSPHORUS: Phosphorus: 4.5 mg/dL (ref 2.3–4.6)

## 2020-07-14 NOTE — Telephone Encounter (Signed)
Appt with Dr. Cruzita Lederer 09/03/20.

## 2020-07-14 NOTE — Telephone Encounter (Signed)
Patient called to request to be notified via MyChart message if the response to Patient's question is a yes or no.

## 2020-07-14 NOTE — Telephone Encounter (Signed)
Pt ready for scheduling on or after 08/05/20  Out-of-pocket cost due at time of visit: $0.00  Primary: Medicare Prolia co-insurance: 20% ($255) Admin fee co-insurance: 20% ($25)  Secondary: BCBS FEP Prolia co-insurance: covers Medicare part B deductible and co-insurance Admin fee co-insurance: covers Medicare part B deductible and co-insurance  Deductible: covered by secondary  Prior Auth:  n/a PA# Valid:

## 2020-07-15 ENCOUNTER — Other Ambulatory Visit: Payer: Self-pay | Admitting: Internal Medicine

## 2020-07-15 ENCOUNTER — Ambulatory Visit: Payer: Medicare Other | Admitting: Internal Medicine

## 2020-07-15 ENCOUNTER — Other Ambulatory Visit: Payer: Medicare Other

## 2020-07-15 DIAGNOSIS — D696 Thrombocytopenia, unspecified: Secondary | ICD-10-CM

## 2020-07-15 LAB — VITAMIN D 25 HYDROXY (VIT D DEFICIENCY, FRACTURES): Vit D, 25-Hydroxy: 56.1 ng/mL (ref 30.0–100.0)

## 2020-07-15 NOTE — Addendum Note (Signed)
Addended by: Kaylyn Lim I on: 07/15/2020 10:54 AM   Modules accepted: Orders

## 2020-07-16 ENCOUNTER — Encounter: Payer: Self-pay | Admitting: Internal Medicine

## 2020-07-19 ENCOUNTER — Other Ambulatory Visit: Payer: Self-pay | Admitting: Cardiology

## 2020-07-20 ENCOUNTER — Other Ambulatory Visit: Payer: Self-pay | Admitting: Internal Medicine

## 2020-07-20 DIAGNOSIS — E890 Postprocedural hypothyroidism: Secondary | ICD-10-CM

## 2020-07-20 LAB — VITAMIN D 1,25 DIHYDROXY
Vitamin D 1, 25 (OH)2 Total: 51 pg/mL (ref 18–72)
Vitamin D2 1, 25 (OH)2: 18 pg/mL
Vitamin D3 1, 25 (OH)2: 33 pg/mL

## 2020-07-20 LAB — CALCIUM, IONIZED: Calcium, Ion: 4.85 mg/dL (ref 4.8–5.6)

## 2020-07-20 MED ORDER — LEVOTHYROXINE SODIUM 175 MCG PO TABS: 175.0000 ug | ORAL_TABLET | Freq: Every day | ORAL | 3 refills | Status: DC

## 2020-07-20 NOTE — Telephone Encounter (Signed)
46f, 80.1kg, scr 0.88 07/12/20, lovw/hilty 04/19/20

## 2020-08-01 DIAGNOSIS — K56609 Unspecified intestinal obstruction, unspecified as to partial versus complete obstruction: Secondary | ICD-10-CM | POA: Diagnosis not present

## 2020-08-01 DIAGNOSIS — I110 Hypertensive heart disease with heart failure: Secondary | ICD-10-CM | POA: Diagnosis not present

## 2020-08-01 DIAGNOSIS — R1084 Generalized abdominal pain: Secondary | ICD-10-CM | POA: Diagnosis not present

## 2020-08-01 DIAGNOSIS — I480 Paroxysmal atrial fibrillation: Secondary | ICD-10-CM | POA: Diagnosis not present

## 2020-08-01 DIAGNOSIS — I495 Sick sinus syndrome: Secondary | ICD-10-CM | POA: Diagnosis not present

## 2020-08-01 DIAGNOSIS — I5032 Chronic diastolic (congestive) heart failure: Secondary | ICD-10-CM | POA: Diagnosis not present

## 2020-08-01 DIAGNOSIS — M797 Fibromyalgia: Secondary | ICD-10-CM | POA: Diagnosis not present

## 2020-08-01 DIAGNOSIS — R1031 Right lower quadrant pain: Secondary | ICD-10-CM | POA: Diagnosis not present

## 2020-08-01 DIAGNOSIS — Z5329 Procedure and treatment not carried out because of patient's decision for other reasons: Secondary | ICD-10-CM | POA: Diagnosis not present

## 2020-08-01 DIAGNOSIS — Z7901 Long term (current) use of anticoagulants: Secondary | ICD-10-CM | POA: Diagnosis not present

## 2020-08-01 DIAGNOSIS — Z9884 Bariatric surgery status: Secondary | ICD-10-CM | POA: Diagnosis not present

## 2020-08-01 DIAGNOSIS — Z20822 Contact with and (suspected) exposure to covid-19: Secondary | ICD-10-CM | POA: Diagnosis not present

## 2020-08-01 DIAGNOSIS — E890 Postprocedural hypothyroidism: Secondary | ICD-10-CM | POA: Diagnosis not present

## 2020-08-01 DIAGNOSIS — Z01818 Encounter for other preprocedural examination: Secondary | ICD-10-CM | POA: Diagnosis not present

## 2020-08-01 DIAGNOSIS — Z950 Presence of cardiac pacemaker: Secondary | ICD-10-CM | POA: Diagnosis not present

## 2020-08-02 DIAGNOSIS — I5032 Chronic diastolic (congestive) heart failure: Secondary | ICD-10-CM | POA: Diagnosis not present

## 2020-08-02 DIAGNOSIS — Z950 Presence of cardiac pacemaker: Secondary | ICD-10-CM | POA: Diagnosis not present

## 2020-08-02 DIAGNOSIS — Z9884 Bariatric surgery status: Secondary | ICD-10-CM | POA: Diagnosis not present

## 2020-08-02 DIAGNOSIS — K56609 Unspecified intestinal obstruction, unspecified as to partial versus complete obstruction: Secondary | ICD-10-CM | POA: Diagnosis not present

## 2020-08-02 DIAGNOSIS — E890 Postprocedural hypothyroidism: Secondary | ICD-10-CM | POA: Diagnosis not present

## 2020-08-02 DIAGNOSIS — N200 Calculus of kidney: Secondary | ICD-10-CM | POA: Diagnosis not present

## 2020-08-02 DIAGNOSIS — I480 Paroxysmal atrial fibrillation: Secondary | ICD-10-CM | POA: Diagnosis not present

## 2020-08-02 DIAGNOSIS — D259 Leiomyoma of uterus, unspecified: Secondary | ICD-10-CM | POA: Diagnosis not present

## 2020-08-02 DIAGNOSIS — Z01818 Encounter for other preprocedural examination: Secondary | ICD-10-CM | POA: Diagnosis not present

## 2020-08-04 ENCOUNTER — Telehealth: Payer: Self-pay | Admitting: Internal Medicine

## 2020-08-04 MED ORDER — ELIQUIS 5 MG PO TABS: 1.0000 | ORAL_TABLET | Freq: Two times a day (BID) | ORAL | 0 refills | Status: DC

## 2020-08-04 NOTE — Telephone Encounter (Signed)
*  STAT* If patient is at the pharmacy, call can be transferred to refill team.   1. Which medications need to be refilled? (please list name of each medication and dose if known)  ELIQUIS 5 MG TABS tablet  2. Which pharmacy/location (including street and city if local pharmacy) is medication to be sent to?  [facility], [place] [address]  3. Do they need a 30 day or 90 day supply?   Patient is requesting a 2 week temporary supply of Eliquis. She states she is out of town and she has been out of her medication for about 2 days. She states she thought she brought it with her and looked for it for a while, but she did not find it. Assumes she left it at home.

## 2020-08-04 NOTE — Telephone Encounter (Signed)
47f, 80.1kg, scr 1.0 08/02/20, lovw/hilty 04/19/20. I also called and notified pt and she voiced understanding

## 2020-08-05 ENCOUNTER — Ambulatory Visit: Payer: Medicare Other | Admitting: Internal Medicine

## 2020-08-11 ENCOUNTER — Other Ambulatory Visit: Payer: Self-pay | Admitting: Gastroenterology

## 2020-08-11 DIAGNOSIS — M47816 Spondylosis without myelopathy or radiculopathy, lumbar region: Secondary | ICD-10-CM | POA: Diagnosis not present

## 2020-08-11 DIAGNOSIS — R933 Abnormal findings on diagnostic imaging of other parts of digestive tract: Secondary | ICD-10-CM | POA: Diagnosis not present

## 2020-08-11 DIAGNOSIS — R1084 Generalized abdominal pain: Secondary | ICD-10-CM | POA: Diagnosis not present

## 2020-08-11 DIAGNOSIS — R112 Nausea with vomiting, unspecified: Secondary | ICD-10-CM | POA: Diagnosis not present

## 2020-08-12 NOTE — Progress Notes (Signed)
Attempted to obtain medical history via telephone, unable to reach at this time. I left a voicemail to return pre surgical testing department's phone call.  

## 2020-08-13 ENCOUNTER — Ambulatory Visit (HOSPITAL_COMMUNITY): Payer: Medicare Other | Admitting: Anesthesiology

## 2020-08-13 ENCOUNTER — Other Ambulatory Visit: Payer: Self-pay

## 2020-08-13 ENCOUNTER — Telehealth: Payer: Self-pay | Admitting: Gastroenterology

## 2020-08-13 ENCOUNTER — Encounter (HOSPITAL_COMMUNITY)
Admission: RE | Disposition: A | Discharge status: Home / Self Care | Payer: Self-pay | Source: Home / Self Care | Attending: Gastroenterology

## 2020-08-13 ENCOUNTER — Ambulatory Visit (HOSPITAL_COMMUNITY)
Admission: RE | Admit: 2020-08-13 | Discharge: 2020-08-13 | Disposition: A | Discharge status: Home / Self Care | Payer: Medicare Other | Attending: Gastroenterology | Admitting: Gastroenterology

## 2020-08-13 DIAGNOSIS — Z885 Allergy status to narcotic agent status: Secondary | ICD-10-CM | POA: Diagnosis not present

## 2020-08-13 DIAGNOSIS — Z9049 Acquired absence of other specified parts of digestive tract: Secondary | ICD-10-CM | POA: Insufficient documentation

## 2020-08-13 DIAGNOSIS — Z8616 Personal history of COVID-19: Secondary | ICD-10-CM | POA: Diagnosis not present

## 2020-08-13 DIAGNOSIS — K219 Gastro-esophageal reflux disease without esophagitis: Secondary | ICD-10-CM | POA: Diagnosis not present

## 2020-08-13 DIAGNOSIS — Z96653 Presence of artificial knee joint, bilateral: Secondary | ICD-10-CM | POA: Insufficient documentation

## 2020-08-13 DIAGNOSIS — Z980 Intestinal bypass and anastomosis status: Secondary | ICD-10-CM | POA: Insufficient documentation

## 2020-08-13 DIAGNOSIS — R933 Abnormal findings on diagnostic imaging of other parts of digestive tract: Secondary | ICD-10-CM | POA: Diagnosis not present

## 2020-08-13 DIAGNOSIS — Z87442 Personal history of urinary calculi: Secondary | ICD-10-CM | POA: Insufficient documentation

## 2020-08-13 DIAGNOSIS — Z950 Presence of cardiac pacemaker: Secondary | ICD-10-CM | POA: Diagnosis not present

## 2020-08-13 DIAGNOSIS — Z9884 Bariatric surgery status: Secondary | ICD-10-CM | POA: Diagnosis not present

## 2020-08-13 DIAGNOSIS — D696 Thrombocytopenia, unspecified: Secondary | ICD-10-CM | POA: Diagnosis not present

## 2020-08-13 DIAGNOSIS — J4520 Mild intermittent asthma, uncomplicated: Secondary | ICD-10-CM | POA: Diagnosis not present

## 2020-08-13 DIAGNOSIS — I482 Chronic atrial fibrillation, unspecified: Secondary | ICD-10-CM | POA: Diagnosis not present

## 2020-08-13 HISTORY — PX: ENTEROSCOPY: SHX5533

## 2020-08-13 SURGERY — ENTEROSCOPY
Anesthesia: Monitor Anesthesia Care

## 2020-08-13 MED ORDER — PROPOFOL 10 MG/ML IV BOLUS
INTRAVENOUS | Status: AC
  Filled 2020-08-13: qty 20

## 2020-08-13 MED ORDER — PROPOFOL 10 MG/ML IV BOLUS
INTRAVENOUS | Status: DC | PRN
  Administered 2020-08-13: 20 mg via INTRAVENOUS
  Administered 2020-08-13: 30 mg via INTRAVENOUS

## 2020-08-13 MED ORDER — LACTATED RINGERS IV SOLN: INTRAVENOUS | Status: DC | PRN

## 2020-08-13 MED ORDER — PROPOFOL 500 MG/50ML IV EMUL
INTRAVENOUS | Status: AC
  Filled 2020-08-13: qty 50

## 2020-08-13 MED ORDER — PROPOFOL 500 MG/50ML IV EMUL
INTRAVENOUS | Status: DC | PRN
  Administered 2020-08-13: 125 ug/kg/min via INTRAVENOUS

## 2020-08-13 MED ORDER — SODIUM CHLORIDE 0.9 % IV SOLN: INTRAVENOUS | Status: DC

## 2020-08-13 MED ORDER — LIDOCAINE 2% (20 MG/ML) 5 ML SYRINGE
INTRAMUSCULAR | Status: DC | PRN
  Administered 2020-08-13: 60 mg via INTRAVENOUS

## 2020-08-13 MED ORDER — EPHEDRINE SULFATE-NACL 50-0.9 MG/10ML-% IV SOSY
PREFILLED_SYRINGE | INTRAVENOUS | Status: DC | PRN
  Administered 2020-08-13: 10 mg via INTRAVENOUS

## 2020-08-13 MED ORDER — PHENYLEPHRINE 40 MCG/ML (10ML) SYRINGE FOR IV PUSH (FOR BLOOD PRESSURE SUPPORT)
PREFILLED_SYRINGE | INTRAVENOUS | Status: DC | PRN
  Administered 2020-08-13 (×2): 80 ug via INTRAVENOUS
  Administered 2020-08-13 (×2): 120 ug via INTRAVENOUS

## 2020-08-13 MED ORDER — ONDANSETRON HCL 4 MG/2ML IJ SOLN
INTRAMUSCULAR | Status: DC | PRN
  Administered 2020-08-13: 4 mg via INTRAVENOUS

## 2020-08-13 NOTE — H&P (Signed)
Jillian Eaton HPI:  The patient was on vacation in  [place] and she started to experience abdominal pain.  She was hospitalized and a CT scan was performed.  It showed that the "doudenum is grossly abnormal.  It is dilated and filled with fluid.  A distal obstructing mass at the level of the third or fourth portion of the duodenum is suspected and there is a questionable soft tissue mass on the CT examination...".  A diffuse ileus was also identified.  She "checked herself out" as she as feeling better, but she did not feel that she was receiving proper care.  Since leaving the hospital and returning back to [place] she still experiences "soreness" and some contractions with PO intake.  She does notreport any vomiting or diarrhea.   Past Medical History:  Diagnosis Date   Anemia    iron and pernicious   Arthritis    Asthma    Cancer (HCC)    CHF (congestive heart failure) ([place]) 2229   Complication of anesthesia    BP dropped    DOE (dyspnea on exertion)    2D ECHO, 02/12/2012 - EF 60-65%, moderate concentric hypertrophy   Dyspnea    Dysrhythmia    a-fib   Fibromyalgia    nerve pain"left side at waist level" "can't lay on that side without pain" , "HOB elevation helps"; pt. thinks this has resolved after 200lb weight loss9-17- since i lost the wieght , i dont  think i have this anymore    Headache 04/2019   Heart murmur    Hematuria - cause not known    resolved    History of COVID-19    History of kidney stones    x 2 '13, '14 surgery to remove   Hypertension    Pneumonia    aspiration  04/2016   Presence of permanent cardiac pacemaker    SBO (small bowel obstruction) ([place])    rqueired admission 2020   Thyroid disease    "goiter"   Transfusion history    10 yrs+    Past Surgical History:  Procedure Laterality Date   ABDOMINAL ADHESION SURGERY  2004   open LOA - in [place]   AV NODE ABLATION N/A 06/20/2019   Procedure: AV NODE ABLATION;  Surgeon: Evans Lance, MD;  Location:  [facility];  Service: Cardiovascular;  Laterality: N/A;   BALLOON DILATION N/A 07/25/2019   Procedure: BALLOON DILATION;  Surgeon: Carol Ada, MD;  Location: [facility];  Service: Endoscopy;  Laterality: N/A;   CARDIAC CATHETERIZATION  04/04/2010   No significant obstructive coronary artery disease   CARDIOVERSION N/A 08/08/2017   Procedure: CARDIOVERSION;  Surgeon: Pixie Casino, MD;  Location: [facility];  Service: Cardiovascular;  Laterality: N/A;   CARDIOVERSION N/A 06/18/2019   Procedure: CARDIOVERSION;  Surgeon: Pixie Casino, MD;  Location: [facility];  Service: Cardiovascular;  Laterality: N/A;   CHOLECYSTECTOMY  1990   COLONOSCOPY W/ POLYPECTOMY     COLONOSCOPY WITH PROPOFOL N/A 04/10/2014   Procedure: COLONOSCOPY WITH PROPOFOL;  Surgeon: Beryle Beams, MD;  Location: [facility];  Service: Endoscopy;  Laterality: N/A;   CYSTOSCOPY/URETEROSCOPY/HOLMIUM LASER/STENT PLACEMENT Right 08/13/2019   Procedure: CYSTOSCOPY/RETROGRADE/URETEROSCOPY/HOLMIUM LASER/STENT PLACEMENT;  Surgeon: Ceasar Mons, MD;  Location: [facility];  Service: Urology;  Laterality: Right;  ONLY NEEDS 45 MIN   ESOPHAGOGASTRODUODENOSCOPY (EGD) WITH PROPOFOL N/A 07/25/2019   Procedure: ESOPHAGOGASTRODUODENOSCOPY (EGD) WITH PROPOFOL;  Surgeon: Carol Ada, MD;  Location: [facility];  Service:  Endoscopy;  Laterality: N/A;   EYE SURGERY     lasik 20-25 yrs ago   GASTROPLASTY VERTICAL BANDED  [place]   In [place] for SBO   [place]  07/25/2019   Procedure: MALONEY DILATION;  Surgeon: Carol Ada, MD;  Location: [facility];  Service: Endoscopy;;   PACEMAKER IMPLANT N/A 06/20/2019   Procedure: PACEMAKER IMPLANT;  Surgeon: Evans Lance, MD;  Location: [facility];  Service: Cardiovascular;  Laterality: N/A;   REVERSE SHOULDER ARTHROPLASTY Right 03/29/2018   Procedure: REVERSE SHOULDER ARTHROPLASTY;  Surgeon: Netta Cedars, MD;   Location: [facility];  Service: Orthopedics;  Laterality: Right;   RIGHT/LEFT HEART CATH AND CORONARY ANGIOGRAPHY N/A 04/23/2020   Procedure: RIGHT/LEFT HEART CATH AND CORONARY ANGIOGRAPHY;  Surgeon: Jolaine Artist, MD;  Location: [facility];  Service: Cardiovascular;  Laterality: N/A;   ROUX-EN-Y GASTRIC BYPASS  1992   Conversion VBG to RnYGB in [place], [place]   SHOULDER ARTHROSCOPY WITH SUBACROMIAL DECOMPRESSION  2016   Dr Jannett Celestine, [place]   SMALL INTESTINE SURGERY  2000   SBO - LOA w SB resection   TOTAL KNEE ARTHROPLASTY Left 11/14/2018   Procedure: TOTAL KNEE ARTHROPLASTY;  Surgeon: Paralee Cancel, MD;  Location: [facility];  Service: Orthopedics;  Laterality: Left;  70 mins   TOTAL KNEE ARTHROPLASTY Right 04/29/2019   Procedure: TOTAL KNEE ARTHROPLASTY;  Surgeon: Paralee Cancel, MD;  Location: [facility];  Service: Orthopedics;  Laterality: Right;  70 mins   TOTAL THYROIDECTOMY  06/26/2012   Dr Celine Ahr, [facility]   TUBAL LIGATION  1986   URETHRAL DILATION  2012   w cystoscopy.  Dr Gaynelle Arabian   UTERINE FIBROID EMBOLIZATION      Family History  Problem Relation Age of Onset   Diabetes Mother    Epilepsy Mother    Cancer Mother        Breast   Hypertension Mother    Breast cancer Mother    Seizures Mother    Kidney disease Father    Diabetes Father    Hypertension Father    Asthma Father    Heart disease Father    Epilepsy Sister    Cancer Maternal Grandmother    Breast cancer Maternal Grandmother    Cancer Paternal Grandmother    Breast cancer Paternal Grandmother     Social History:  reports that she has never smoked. She has never used smokeless tobacco. She reports that she does not drink alcohol and does not use drugs.  Allergies:  Allergies  Allergen Reactions   Oxycodone-Acetaminophen Itching, Nausea And Vomiting, Rash and Other (See Comments)        Hydrocodone Itching         Medications: Scheduled: Continuous:  sodium chloride      No  results found for this or any previous visit (from the past 24 hour(s)).   No results found.  ROS:  As stated above in the HPI otherwise negative.  There were no vitals taken for this visit.    PE: Gen: NAD, Alert and Oriented HEENT:  [place]/AT, EOMI Neck: Supple, no LAD Lungs: CTA Bilaterally CV: RRR without M/G/R ABD: Soft, NTND, +BS Ext: No C/C/E  Assessment/Plan: 1) Abnormal CT scan - enteroscopy to evaluate the duodenum.  Jaeson Molstad D 08/13/2020, 8:46 AM

## 2020-08-13 NOTE — Telephone Encounter (Signed)
Patient called this evening around 9PM. She had an enteroscopy with Dr. Benson Norway earlier today. Diagnostic exam, no high risk interventions done, report reviewed. She states she had a sore throat after the exam and it has persisted this evening. Discomfort mostly in back of her mouth /  throat, mildly in very upper chest. No chest pain or shortness of breath otherwise. No fevers. Symptoms not severe, she states mild. Discussed that sore throat can happen post procedure from the endoscope. She is eating okay, no odynophagia / dysphagia. Recommend she use some throat lozenges for now, cepacol, and can try some Maalox if having some reflux to see if she can get some relief. Told her to keep an eye on this. If worsening discomfort, can't get comfortable, shortness of breath, fever, etc, should seek evaluation in the ED but she denies any of that right now and seems mostly localized to the back of her throat. She will try interventions we discussed and contact me if no improvement or worsening.   Tiana Loft

## 2020-08-13 NOTE — Discharge Instructions (Signed)

## 2020-08-13 NOTE — Op Note (Signed)
[facility] Patient Name: Jillian Eaton Procedure Date: 08/13/2020 MRN: [id number] Attending MD: Carol Ada , MD Date of Birth: 19-Feb-1951 CSN: [id number] Age: 70 Admit Type: Outpatient Procedure:                Small bowel enteroscopy Indications:              Abnormal abdominal CT Providers:                Carol Ada, MD, Dulcy Fanny, Elspeth Cho                            Tech., Technician, [address]. Zenia Resides CRNA, CRNA Referring MD:              Medicines:                Propofol per Anesthesia Complications:            No immediate complications. Estimated Blood Loss:     Estimated blood loss: none. Procedure:                Pre-Anesthesia Assessment:                           - Prior to the procedure, a History and Physical                            was performed, and patient medications and                            allergies were reviewed. The patient's tolerance of                            previous anesthesia was also reviewed. The risks                            and benefits of the procedure and the sedation                            options and risks were discussed with the patient.                            All questions were answered, and informed consent                            was obtained. Prior Anticoagulants: The patient has                            taken Eliquis (apixaban), last dose was 2 days                            prior to procedure. ASA Grade Assessment: III - A                            patient with severe systemic disease. After  reviewing the risks and benefits, the patient was                            deemed in satisfactory condition to undergo the                            procedure.                           - Sedation was administered by an anesthesia                            professional. Deep sedation was attained.                           After obtaining informed consent, the  endoscope was                            passed under direct vision. Throughout the                            procedure, the patient's blood pressure, pulse, and                            oxygen saturations were monitored continuously. The                            [id number]-[id number] ([id number] Olympus pediatric colonscope                            was introduced through the mouth and advanced to                            the afferent and efferent jejunal loop. The small                            bowel enteroscopy was technically difficult and                            complex due to abnormal anatomy. Successful                            completion of the procedure was aided by using                            manual pressure and straightening and shortening                            the scope to obtain bowel loop reduction. The                            patient tolerated the procedure well. Scope In: Scope Out: Findings:      The esophagus was normal.      Evidence of a Roux-en-Y gastrojejunostomy was found. The gastrojejunal  anastomosis was characterized by healthy appearing mucosa. This was       traversed. The pouch-to-jejunum limb was characterized by healthy       appearing mucosa. The jejunojejunal anastomosis was characterized by       healthy appearing mucosa. The duodenum-to-jejunum limb was not examined       as it could not be reached.      There was evidence of a widely patent enteroenterostomy in the efferent       jejunal loop. This was characterized by healthy appearing mucosa.      The jejunojejunostomy was reached and the pediatric colonoscope entered       the Y limb. A combination of torquing, scope reduction, and external       pressure was applied. The colonoscope was not able to advanced       proximally to examine the duodenum. Impression:               - Normal esophagus.                           - Roux-en-Y gastrojejunostomy with gastrojejunal                             anastomosis characterized by healthy appearing                            mucosa.                           - Widely patent enteroenterostomy, characterized by                            healthy appearing mucosa was found in the jejunum.                           - No specimens collected. Recommendation:           - Patient has a contact number available for                            emergencies. The signs and symptoms of potential                            delayed complications were discussed with the                            patient. Return to normal activities tomorrow.                            Written discharge instructions were provided to the                            patient.                           - Resume previous diet.                           - Resume Eliquis.                           -  Referral to [facility] [place]. Procedure Code(s):        --- Professional ---                           [id number], Small intestinal endoscopy, enteroscopy                            beyond second portion of duodenum, not including                            ileum; diagnostic, including collection of                            specimen(s) by brushing or washing, when performed                            (separate procedure) Diagnosis Code(s):        --- Professional ---                           R93.3, Abnormal findings on diagnostic imaging of                            other parts of digestive tract                           Z98.0, Intestinal bypass and anastomosis status CPT copyright 2019 [employer] Association. All rights reserved. The codes documented in this report are preliminary and upon coder review may  be revised to meet current compliance requirements. Carol Ada, MD Carol Ada, MD 08/13/2020 11:18:40 AM This report has been signed electronically. Number of Addenda: 0

## 2020-08-13 NOTE — Anesthesia Postprocedure Evaluation (Signed)
Anesthesia Post Note  Patient: Jillian Eaton  Procedure(s) Performed: ENTEROSCOPY     Patient location during evaluation: PACU Anesthesia Type: MAC Level of consciousness: awake and alert Pain management: pain level controlled Vital Signs Assessment: post-procedure vital signs reviewed and stable Respiratory status: spontaneous breathing Cardiovascular status: stable Anesthetic complications: no   No notable events documented.  Last Vitals:  Vitals:   08/13/20 1130 08/13/20 1138  BP: 91/60 106/70  Pulse: 62 63  Resp: 16 (!) 23  Temp:    SpO2: 100% 100%    Last Pain:  Vitals:   08/13/20 1138  TempSrc:   PainSc: [place]

## 2020-08-13 NOTE — Anesthesia Procedure Notes (Signed)
Procedure Name: MAC Date/Time: 08/13/2020 10:00 AM Performed by: Lollie Sails, CRNA Pre-anesthesia Checklist: Patient identified, Emergency Drugs available, Suction available, Patient being monitored and Timeout performed Preoxygenation: POM used.

## 2020-08-13 NOTE — Transfer of Care (Signed)
Immediate Anesthesia Transfer of Care Note  Patient: Jillian Eaton  Procedure(s) Performed: ENTEROSCOPY  Patient Location: PACU and Endoscopy Unit  Anesthesia Type:MAC  Level of Consciousness: awake, drowsy and responds to stimulation  Airway & Oxygen Therapy: Patient Spontanous Breathing and Patient connected to face mask oxygen  Post-op Assessment: Report given to RN, Post -op Vital signs reviewed and stable and blood pressure in 80's.    Post vital signs: Reviewed and stable  Last Vitals:  Vitals Value Taken Time  BP    Temp    Pulse    Resp    SpO2      Last Pain:  Vitals:   08/13/20 0914  TempSrc: Oral  PainSc: 0-No pain         Complications: No notable events documented.

## 2020-08-13 NOTE — Anesthesia Preprocedure Evaluation (Signed)
Anesthesia Evaluation  Patient identified by MRN, date of birth, ID band Patient awake    Reviewed: Allergy & Precautions, H&P , NPO status , Patient's Chart, lab work & pertinent test results  History of Anesthesia Complications (+) history of anesthetic complications  Airway Mallampati: I  TM Distance: >3 FB Neck ROM: Full    Dental  (+) Partial Upper, Partial Lower, Dental Advisory Given   Pulmonary shortness of breath, asthma , pneumonia,    Pulmonary exam normal breath sounds clear to auscultation       Cardiovascular hypertension, Pt. on medications +CHF and + DOE  + dysrhythmias Atrial Fibrillation + pacemaker + Valvular Problems/Murmurs  Rhythm:Regular Rate:Normal  Echo 04/2020 1. Left ventricular ejection fraction, by estimation, is 55 to 60%. The left ventricle has normal function. The left ventricle has no regional wall motion abnormalities. There is mild concentric left ventricular hypertrophy. Left ventricular diastolic parameters are indeterminate.  2. Right ventricular systolic function is normal. The right ventricular size is mildly enlarged. There is normal pulmonary artery systolic pressure.  3. Left atrial size was severely dilated.  4. Right atrial size was severely dilated.  5. The mitral valve is grossly normal. Trivial mitral valve  regurgitation.  6. Tricuspid valve regurgitation is moderate.  7. The aortic valve is tricuspid. There is mild calcification of the aortic valve. There is mild thickening of the aortic valve. Aortic valve regurgitation is mild.    Neuro/Psych  Headaches, Seizures -, Well Controlled,  PSYCHIATRIC DISORDERS Depression  Neuromuscular disease    GI/Hepatic Neg liver ROS, GERD  ,  Endo/Other  negative endocrine ROSHypothyroidism   Renal/GU Renal disease     Musculoskeletal  (+) Arthritis , Osteoarthritis,  Fibromyalgia -  Abdominal (+) - obese,   Peds   Hematology  (+) Blood dyscrasia, anemia ,   Anesthesia Other Findings   Reproductive/Obstetrics negative OB ROS                             Anesthesia Physical  Anesthesia Plan  ASA: 3  Anesthesia Plan: MAC   Post-op Pain Management:    Induction: Intravenous  PONV Risk Score and Plan: 4 or greater and Ondansetron, Treatment may vary due to age or medical condition, Propofol infusion and TIVA  Airway Management Planned: Natural Airway  Additional Equipment:   Intra-op Plan:   Post-operative Plan:   Informed Consent: I have reviewed the patients History and Physical, chart, labs and discussed the procedure including the risks, benefits and alternatives for the proposed anesthesia with the patient or authorized representative who has indicated his/her understanding and acceptance.     Dental advisory given  Plan Discussed with: CRNA  Anesthesia Plan Comments:         Anesthesia Quick Evaluation

## 2020-08-18 ENCOUNTER — Telehealth (INDEPENDENT_AMBULATORY_CARE_PROVIDER_SITE_OTHER): Payer: Medicare Other | Admitting: Internal Medicine

## 2020-08-18 VITALS — BP 140/80 | Wt 172.0 lb

## 2020-08-18 DIAGNOSIS — I482 Chronic atrial fibrillation, unspecified: Secondary | ICD-10-CM

## 2020-08-18 DIAGNOSIS — Z950 Presence of cardiac pacemaker: Secondary | ICD-10-CM

## 2020-08-18 DIAGNOSIS — R0600 Dyspnea, unspecified: Secondary | ICD-10-CM

## 2020-08-18 DIAGNOSIS — Z9889 Other specified postprocedural states: Secondary | ICD-10-CM | POA: Diagnosis not present

## 2020-08-18 NOTE — Progress Notes (Signed)
Virtual Visit via Video Note   This visit type was conducted due to national recommendations for restrictions regarding the COVID-19 Pandemic (e.g. social distancing) in an effort to limit this patient's exposure and mitigate transmission in our community.  Due to her co-morbid illnesses, this patient is at least at moderate risk for complications without adequate follow up.  This format is felt to be most appropriate for this patient at this time.  All issues noted in this document were discussed and addressed.  A limited physical exam was performed with this format.  Please refer to the patient's chart for her consent to telehealth for [facility].      Date:  08/18/2020   ID:  [place], DOB May 21, 1950, MRN [id number] The patient was identified using 2 identifiers.  Evaluation Performed:  Follow-Up Visit  Patient Location:  [address] [place] [address]  Provider location:   [address], [place], [place] [address]  PCP:  [name], MD  Cardiologist:  Pixie Casino, MD Electrophysiologist:  Cristopher Peru, MD   Chief Complaint:  Follow-up hospitalization  History of Present Illness:    Jillian Eaton is a 70 y.o. female who presents via audio/video conferencing for a telehealth visit today.  Jillian Eaton is seen today via video follow-up.  She was hospitalized in March 2022 with shortness of breath.  She has a history of nonischemic cardiomyopathy with EF as low as 25 to 30%.  A lot of which may have been due to atrial fibrillation.  She is status post AV node ablation and has permanent A. fib now pacemaker dependent.  Other medical problems include gastric bypass with recent significant weight loss although her surgeries back in the 1990s, stage III chronic kidney disease and obstructive sleep apnea.  Her weight seems to stabilize in the 175 pound range.  She was admitted with acute chest pain and worsening shortness of  breath.  CT scan was negative for PE.  She was seen in consultation and underwent left and right heart catheterization.  He was noted to be somewhat hypotensive, however her coronaries showed ectatic but widely patent coronaries with normal LVEF and normal PA and left-sided filling pressures.  Subsequently she has been discharged.  Ultimately she was not noted to be regularly taking vitamins which are necessary after gastric bypass and she said once starting those she felt considerably better.  She was also recently hospitalized in [place] on vacation for a bowel obstruction.  Subsequently she underwent recent mall bowel endoscopy by Dr. Benson Norway however he could not visualize the areas of obstruction due to her prior gastric bypass.  Currently she says she feels very well.  She has plenty of energy, does not get short of breath, denies any chest pain or any associated issues.  Her pacemaker remote checks have been normal.  The patient does not have symptoms concerning for COVID-19 infection (fever, chills, cough, or new SHORTNESS OF BREATH).    Prior CV studies:   The following studies were reviewed today:  Chart reviewed, lab work  PMHx:  Past Medical History:  Diagnosis Date   Anemia    iron and pernicious   Arthritis    Asthma    Cancer ([place])    CHF (congestive heart failure) ([place]) 7371   Complication of anesthesia    BP dropped    DOE (dyspnea on exertion)    2D ECHO, 02/12/2012 - EF 60-65%, moderate concentric hypertrophy  Dyspnea    Dysrhythmia    a-fib   Fibromyalgia    nerve pain"left side at waist level" "can't lay on that side without pain" , "HOB elevation helps"; pt. thinks this has resolved after 200lb weight loss9-17- since i lost the wieght , i dont  think i have this anymore    Headache 04/2019   Heart murmur    Hematuria - cause not known    resolved    History of COVID-19    History of kidney stones    x 2 '13, '14 surgery to remove   Hypertension    Pneumonia     aspiration  04/2016   Presence of permanent cardiac pacemaker    SBO (small bowel obstruction) ([place])    rqueired admission 2020   Thyroid disease    "goiter"   Transfusion history    10 yrs+    Past Surgical History:  Procedure Laterality Date   ABDOMINAL ADHESION SURGERY  2004   open LOA - in [place]   AV NODE ABLATION N/A 06/20/2019   Procedure: AV NODE ABLATION;  Surgeon: Evans Lance, MD;  Location: [facility] CV LAB;  Service: Cardiovascular;  Laterality: N/A;   BALLOON DILATION N/A 07/25/2019   Procedure: BALLOON DILATION;  Surgeon: Carol Ada, MD;  Location: [facility] ENDOSCOPY;  Service: Endoscopy;  Laterality: N/A;   CARDIAC CATHETERIZATION  04/04/2010   No significant obstructive coronary artery disease   CARDIOVERSION N/A 08/08/2017   Procedure: CARDIOVERSION;  Surgeon: Pixie Casino, MD;  Location: [facility];  Service: Cardiovascular;  Laterality: N/A;   CARDIOVERSION N/A 06/18/2019   Procedure: CARDIOVERSION;  Surgeon: Pixie Casino, MD;  Location: [facility] ENDOSCOPY;  Service: Cardiovascular;  Laterality: N/A;   CHOLECYSTECTOMY  1990   COLONOSCOPY W/ POLYPECTOMY     COLONOSCOPY WITH PROPOFOL N/A 04/10/2014   Procedure: COLONOSCOPY WITH PROPOFOL;  Surgeon: Beryle Beams, MD;  [facility]: [facility]OS[facility];  Service: Endoscopy;  Laterality: N/A;   CYSTOSCOPY/URETEROSCOPY/HOLMIUM LASER/STENT PLACEMENT Right 08/13/2019   Procedure: CYSTOSCOPY/RETROGRADE/URETEROSCOPY/HOLMIUM LASER/STENT PLACEMENT;  Surgeon: Ceasar Mons, MD;  Location: [facility] ORS;  Service: Urology;  Laterality: Right;  ONLY NEEDS 45 MIN   ESOPHAGOGASTRODUODENOSCOPY (EGD) WITH PROPOFOL N/A 07/25/2019   Procedure: ESOPHAGOGASTRODUODENOSCOPY (EGD) WITH PROPOFOL;  Surgeon: Carol Ada, MD;  [facility];  Service: Endoscopy;  Laterality: N/A;   EYE SURGERY     lasik 20-25 yrs ago   GASTROPLASTY VERTICAL BANDED  [place]   In [place] for SBO   [place]  07/25/2019   Procedure: MALONEY DILATION;  Surgeon: Carol Ada, MD;  Location: [facility];  Service: Endoscopy;;   PACEMAKER IMPLANT N/A 06/20/2019   Procedure: PACEMAKER IMPLANT;  Surgeon: Evans Lance, MD;  Location: [facility] CV LAB;  Service: Cardiovascular;  Laterality: N/A;   REVERSE SHOULDER ARTHROPLASTY Right 03/29/2018   Procedure: REVERSE SHOULDER ARTHROPLASTY;  Surgeon: Netta Cedars, MD;  Location: [facility];  Service: Orthopedics;  Laterality: Right;   RIGHT/LEFT HEART CATH AND CORONARY ANGIOGRAPHY N/A 04/23/2020   Procedure: RIGHT/LEFT HEART CATH AND CORONARY ANGIOGRAPHY;  Surgeon: Jolaine Artist, MD;  Location: [facility] CV LAB;  Service: Cardiovascular;  Laterality: N/A;   ROUX-EN-Y GASTRIC BYPASS  1992   Conversion VBG to RnYGB in [place], [place]   SHOULDER ARTHROSCOPY WITH SUBACROMIAL DECOMPRESSION  2016   Dr Jannett Celestine, [facility]   SMALL INTESTINE SURGERY  2000   SBO - LOA w SB resection  TOTAL KNEE ARTHROPLASTY Left 11/14/2018   Procedure: TOTAL KNEE ARTHROPLASTY;  Surgeon: Paralee Cancel, MD;  [facility];  Service: Orthopedics;  Laterality: Left;  70 mins   TOTAL KNEE ARTHROPLASTY Right 04/29/2019   Procedure: TOTAL KNEE ARTHROPLASTY;  Surgeon: Paralee Cancel, MD;  [facility];  Service: Orthopedics;  Laterality: Right;  70 mins   TOTAL THYROIDECTOMY  06/26/2012   Dr Celine Ahr, [facility]   TUBAL LIGATION  1986   URETHRAL DILATION  2012   w cystoscopy.  Dr Gaynelle Arabian   UTERINE FIBROID EMBOLIZATION      FAMHx:  Family History  Problem Relation Age of Onset   Diabetes Mother    Epilepsy Mother    Cancer Mother        Breast   Hypertension Mother    Breast cancer Mother    Seizures Mother    Kidney disease Father    Diabetes Father    Hypertension Father    Asthma Father    Heart disease Father    Epilepsy Sister    Cancer Maternal Grandmother    Breast cancer Maternal Grandmother    Cancer Paternal Grandmother    Breast cancer  Paternal Grandmother     SOCHx:   reports that she has never smoked. She has never used smokeless tobacco. She reports that she does not drink alcohol and does not use drugs.  ALLERGIES:  Allergies  Allergen Reactions   Oxycodone-Acetaminophen Itching, Nausea And Vomiting, Rash and Other (See Comments)        Hydrocodone Itching         MEDS:  No outpatient medications have been marked as taking for the 08/18/20 encounter (Video Visit) with Pixie Casino, MD.     ROS: Pertinent items noted in HPI and remainder of comprehensive ROS otherwise negative.  Labs/Other Tests and Data Reviewed:    Recent Labs: 04/02/2020: Pro B Natriuretic peptide (BNP) 186.0 04/21/2020: B Natriuretic Peptide 167.9 05/04/2020: Magnesium 1.7 07/12/2020: ALT 17; BUN 25; Creatinine 0.88; Hemoglobin 10.1; Platelet Count 177; Potassium 4.2; Sodium 141 07/14/2020: TSH 6.67   Recent Lipid Panel Lab Results  Component Value Date/Time   CHOL 150 05/27/2018 05:43 AM   TRIG 50 05/27/2018 05:43 AM   HDL 62 05/27/2018 05:43 AM   CHOLHDL 2.4 05/27/2018 05:43 AM   LDLCALC 78 05/27/2018 05:43 AM    Wt Readings from Last 3 Encounters:  08/18/20 172 lb (78 kg)  08/13/20 174 lb (78.9 kg)  06/11/20 176 lb 9.6 oz (80.1 kg)     Exam:    Vital Signs:  BP 140/80   Wt 172 lb (78 kg)   BMI 26.94 kg/m    General appearance: alert and no distress Lungs: No visual respiratory difficulty Abdomen: Near normal weight Extremities: extremities normal, atraumatic, no cyanosis or edema Skin: Skin color, texture, turgor normal. No rashes or lesions Neurologic: Grossly normal Psych: Pleasant  ASSESSMENT & PLAN:    DOE-normal right and left heart catheterization (04/2020) Anesthesia-associated hypotension A. fib with RVR-CHADSVASC score of 4 - s/p AVN ablation and PPM placement ([employer]) - pacer dependent Chronic diastolic congestive heart failure-LVEF 25% (improved to 60-65% in 06/2017) Super morbid obesity,  with failure of 3 gastric bypass procedures -recent significant weight loss Pulmonary hypertension - PA pressure of 64 mmHg, normal LV systolic function Progressive DOE Hypertension-controlled Possible A. fib/ectopic atrial tachycardia-resolved Seizures  Jillian Eaton was hospitalized in March with shortness of breath and chest discomfort.  Ultimately she had right and left  heart catheterization which showed patent coronaries and normal to low normal filling pressures with no pulmonary hypertension.  LVEF has normalized.  She is pacemaker dependent after AV node ablation.  Subsequently she has been on an improved vitamin regimen with her history of gastric bypass and she says she feels much much better.  She unfortunate was hospitalized for small bowel obstruction which has resolved.  She had enteroscopy by Dr. Benson Norway, but he was not able to visualize certain areas apparently due to her anatomy.  No changes to her medicines today.  Plan follow-up with me in the office in 6 months or sooner as necessary.  COVID-19 Education: The signs and symptoms of COVID-19 were discussed with the patient and how to seek care for testing (follow up with PCP or arrange E-visit).  The importance of social distancing was discussed today.  Patient Risk:   After full review of this patients clinical status, I feel that they are at least moderate risk at this time.  Time:   Today, I have spent 25 minutes with the patient with telehealth technology discussing heart failure, atrial fibrillation, pacemaker, recent hospitalization.     Medication Adjustments/Labs and Tests Ordered: Current medicines are reviewed at length with the patient today.  Concerns regarding medicines are outlined above.   Tests Ordered: No orders of the defined types were placed in this encounter.   Medication Changes: No orders of the defined types were placed in this encounter.   Disposition:  in 6 month(s)  Pixie Casino, MD, [facility], [place] Director of the Advanced Lipid Disorders &  Cardiovascular Risk Reduction Clinic Diplomate of the American Board of Clinical Lipidology Attending Cardiologist  Direct Dial: [phone]  Fax: [phone]  Website:  [email or web address]  Pixie Casino, MD  08/18/2020 8:27 AM

## 2020-08-18 NOTE — Patient Instructions (Signed)
Medication Instructions:  Your physician recommends that you continue on your current medications as directed. Please refer to the Current Medication list given to you today.  *If you need a refill on your cardiac medications before your next appointment, please call your pharmacy*  Follow-Up: At [facility], you and your health needs are our priority.  As part of our continuing mission to provide you with exceptional heart care, we have created designated Provider Care Teams.  These Care Teams include your primary Cardiologist (physician) and Advanced Practice Providers (APPs -  Physician Assistants and Nurse Practitioners) who all work together to provide you with the care you need, when you need it.  We recommend signing up for the patient portal called "MyChart".  Sign up information is provided on this After Visit Summary.  MyChart is used to connect with patients for Virtual Visits (Telemedicine).  Patients are able to view lab/test results, encounter notes, upcoming appointments, etc.  Non-urgent messages can be sent to your provider as well.   To learn more about what you can do with MyChart, go to [email or web address].    Your next appointment:   6 month(s)  The format for your next appointment:   In Person  Provider:   K. Mali Hilty, MD

## 2020-08-19 ENCOUNTER — Encounter (HOSPITAL_COMMUNITY): Payer: Self-pay | Admitting: Gastroenterology

## 2020-08-20 ENCOUNTER — Other Ambulatory Visit (INDEPENDENT_AMBULATORY_CARE_PROVIDER_SITE_OTHER): Payer: Medicare Other

## 2020-08-20 ENCOUNTER — Other Ambulatory Visit: Payer: Self-pay

## 2020-08-20 DIAGNOSIS — E890 Postprocedural hypothyroidism: Secondary | ICD-10-CM

## 2020-08-20 LAB — T4, FREE: Free T4: 1.03 ng/dL (ref 0.60–1.60)

## 2020-08-20 LAB — TSH: TSH: 1.88 u[IU]/mL (ref 0.35–5.50)

## 2020-08-20 NOTE — Telephone Encounter (Signed)
Manual transmission received from PPM.  Normal device function.  1 Ventricular arrythmia logged- 8 beats of NSVT on 08/01/20.  Nothing on transmission showing to explain patient symptoms.

## 2020-08-21 ENCOUNTER — Inpatient Hospital Stay (HOSPITAL_COMMUNITY)
Admission: EM | Admit: 2020-08-21 | Discharge: 2020-08-24 | DRG: 871 | Disposition: A | Discharge status: Home / Self Care | Payer: Medicare Other | Attending: Family Medicine | Admitting: Family Medicine

## 2020-08-21 ENCOUNTER — Encounter (HOSPITAL_COMMUNITY): Payer: Self-pay

## 2020-08-21 ENCOUNTER — Encounter: Payer: Self-pay | Admitting: Oncology

## 2020-08-21 ENCOUNTER — Other Ambulatory Visit: Payer: Self-pay

## 2020-08-21 ENCOUNTER — Observation Stay (HOSPITAL_COMMUNITY): Payer: Medicare Other

## 2020-08-21 ENCOUNTER — Ambulatory Visit (INDEPENDENT_AMBULATORY_CARE_PROVIDER_SITE_OTHER): Payer: Medicare Other

## 2020-08-21 ENCOUNTER — Ambulatory Visit (HOSPITAL_COMMUNITY)
Admission: EM | Admit: 2020-08-21 | Discharge: 2020-08-21 | Disposition: A | Discharge status: Home / Self Care | Payer: Medicare Other | Attending: Family Medicine | Admitting: Family Medicine

## 2020-08-21 ENCOUNTER — Ambulatory Visit (HOSPITAL_COMMUNITY): Payer: Medicare Other

## 2020-08-21 DIAGNOSIS — A419 Sepsis, unspecified organism: Secondary | ICD-10-CM | POA: Diagnosis present

## 2020-08-21 DIAGNOSIS — Z20822 Contact with and (suspected) exposure to covid-19: Secondary | ICD-10-CM | POA: Diagnosis not present

## 2020-08-21 DIAGNOSIS — J189 Pneumonia, unspecified organism: Secondary | ICD-10-CM | POA: Diagnosis present

## 2020-08-21 DIAGNOSIS — Z8616 Personal history of COVID-19: Secondary | ICD-10-CM

## 2020-08-21 DIAGNOSIS — Z841 Family history of disorders of kidney and ureter: Secondary | ICD-10-CM

## 2020-08-21 DIAGNOSIS — R079 Chest pain, unspecified: Secondary | ICD-10-CM

## 2020-08-21 DIAGNOSIS — Z820 Family history of epilepsy and other diseases of the nervous system: Secondary | ICD-10-CM

## 2020-08-21 DIAGNOSIS — Z9049 Acquired absence of other specified parts of digestive tract: Secondary | ICD-10-CM

## 2020-08-21 DIAGNOSIS — D638 Anemia in other chronic diseases classified elsewhere: Secondary | ICD-10-CM | POA: Diagnosis present

## 2020-08-21 DIAGNOSIS — I130 Hypertensive heart and chronic kidney disease with heart failure and stage 1 through stage 4 chronic kidney disease, or unspecified chronic kidney disease: Secondary | ICD-10-CM | POA: Diagnosis not present

## 2020-08-21 DIAGNOSIS — Z96653 Presence of artificial knee joint, bilateral: Secondary | ICD-10-CM | POA: Diagnosis present

## 2020-08-21 DIAGNOSIS — I272 Pulmonary hypertension, unspecified: Secondary | ICD-10-CM | POA: Diagnosis present

## 2020-08-21 DIAGNOSIS — J9621 Acute and chronic respiratory failure with hypoxia: Secondary | ICD-10-CM | POA: Diagnosis present

## 2020-08-21 DIAGNOSIS — I5042 Chronic combined systolic (congestive) and diastolic (congestive) heart failure: Secondary | ICD-10-CM | POA: Diagnosis present

## 2020-08-21 DIAGNOSIS — Z825 Family history of asthma and other chronic lower respiratory diseases: Secondary | ICD-10-CM

## 2020-08-21 DIAGNOSIS — Z8249 Family history of ischemic heart disease and other diseases of the circulatory system: Secondary | ICD-10-CM

## 2020-08-21 DIAGNOSIS — G40909 Epilepsy, unspecified, not intractable, without status epilepticus: Secondary | ICD-10-CM | POA: Diagnosis present

## 2020-08-21 DIAGNOSIS — I10 Essential (primary) hypertension: Secondary | ICD-10-CM | POA: Diagnosis present

## 2020-08-21 DIAGNOSIS — M545 Low back pain, unspecified: Secondary | ICD-10-CM | POA: Diagnosis present

## 2020-08-21 DIAGNOSIS — J9601 Acute respiratory failure with hypoxia: Secondary | ICD-10-CM

## 2020-08-21 DIAGNOSIS — I428 Other cardiomyopathies: Secondary | ICD-10-CM | POA: Diagnosis present

## 2020-08-21 DIAGNOSIS — J440 Chronic obstructive pulmonary disease with acute lower respiratory infection: Secondary | ICD-10-CM | POA: Diagnosis present

## 2020-08-21 DIAGNOSIS — Z87442 Personal history of urinary calculi: Secondary | ICD-10-CM

## 2020-08-21 DIAGNOSIS — D631 Anemia in chronic kidney disease: Secondary | ICD-10-CM | POA: Diagnosis present

## 2020-08-21 DIAGNOSIS — R0602 Shortness of breath: Secondary | ICD-10-CM

## 2020-08-21 DIAGNOSIS — G4700 Insomnia, unspecified: Secondary | ICD-10-CM | POA: Diagnosis present

## 2020-08-21 DIAGNOSIS — Z950 Presence of cardiac pacemaker: Secondary | ICD-10-CM

## 2020-08-21 DIAGNOSIS — Z885 Allergy status to narcotic agent status: Secondary | ICD-10-CM

## 2020-08-21 DIAGNOSIS — I482 Chronic atrial fibrillation, unspecified: Secondary | ICD-10-CM | POA: Diagnosis present

## 2020-08-21 DIAGNOSIS — J90 Pleural effusion, not elsewhere classified: Secondary | ICD-10-CM

## 2020-08-21 DIAGNOSIS — M810 Age-related osteoporosis without current pathological fracture: Secondary | ICD-10-CM | POA: Diagnosis present

## 2020-08-21 DIAGNOSIS — N182 Chronic kidney disease, stage 2 (mild): Secondary | ICD-10-CM | POA: Diagnosis present

## 2020-08-21 DIAGNOSIS — J918 Pleural effusion in other conditions classified elsewhere: Secondary | ICD-10-CM | POA: Diagnosis not present

## 2020-08-21 DIAGNOSIS — Z96611 Presence of right artificial shoulder joint: Secondary | ICD-10-CM | POA: Diagnosis present

## 2020-08-21 DIAGNOSIS — Y95 Nosocomial condition: Secondary | ICD-10-CM | POA: Diagnosis present

## 2020-08-21 DIAGNOSIS — J4520 Mild intermittent asthma, uncomplicated: Secondary | ICD-10-CM | POA: Diagnosis present

## 2020-08-21 DIAGNOSIS — R0682 Tachypnea, not elsewhere classified: Secondary | ICD-10-CM

## 2020-08-21 DIAGNOSIS — Z791 Long term (current) use of non-steroidal anti-inflammatories (NSAID): Secondary | ICD-10-CM

## 2020-08-21 DIAGNOSIS — N3281 Overactive bladder: Secondary | ICD-10-CM | POA: Diagnosis present

## 2020-08-21 DIAGNOSIS — K219 Gastro-esophageal reflux disease without esophagitis: Secondary | ICD-10-CM | POA: Diagnosis present

## 2020-08-21 DIAGNOSIS — G4733 Obstructive sleep apnea (adult) (pediatric): Secondary | ICD-10-CM | POA: Diagnosis present

## 2020-08-21 DIAGNOSIS — I517 Cardiomegaly: Secondary | ICD-10-CM | POA: Diagnosis not present

## 2020-08-21 DIAGNOSIS — I4891 Unspecified atrial fibrillation: Secondary | ICD-10-CM

## 2020-08-21 DIAGNOSIS — Z86010 Personal history of colonic polyps: Secondary | ICD-10-CM

## 2020-08-21 DIAGNOSIS — R0600 Dyspnea, unspecified: Secondary | ICD-10-CM | POA: Diagnosis not present

## 2020-08-21 DIAGNOSIS — Z9884 Bariatric surgery status: Secondary | ICD-10-CM

## 2020-08-21 DIAGNOSIS — Z79890 Hormone replacement therapy (postmenopausal): Secondary | ICD-10-CM

## 2020-08-21 DIAGNOSIS — E890 Postprocedural hypothyroidism: Secondary | ICD-10-CM | POA: Diagnosis present

## 2020-08-21 DIAGNOSIS — M797 Fibromyalgia: Secondary | ICD-10-CM | POA: Diagnosis present

## 2020-08-21 DIAGNOSIS — Z85850 Personal history of malignant neoplasm of thyroid: Secondary | ICD-10-CM

## 2020-08-21 DIAGNOSIS — Z79899 Other long term (current) drug therapy: Secondary | ICD-10-CM

## 2020-08-21 DIAGNOSIS — R059 Cough, unspecified: Secondary | ICD-10-CM | POA: Diagnosis not present

## 2020-08-21 DIAGNOSIS — R0902 Hypoxemia: Secondary | ICD-10-CM

## 2020-08-21 DIAGNOSIS — G894 Chronic pain syndrome: Secondary | ICD-10-CM | POA: Diagnosis present

## 2020-08-21 LAB — CBC WITH DIFFERENTIAL/PLATELET
Abs Immature Granulocytes: 0.02 10*3/uL (ref 0.00–0.07)
Basophils Absolute: 0 10*3/uL (ref 0.0–0.1)
Basophils Relative: 1 %
Eosinophils Absolute: 0.1 10*3/uL (ref 0.0–0.5)
Eosinophils Relative: 1 %
HCT: 34.5 % — ABNORMAL LOW (ref 36.0–46.0)
Hemoglobin: 10.6 g/dL — ABNORMAL LOW (ref 12.0–15.0)
Immature Granulocytes: 0 %
Lymphocytes Relative: 8 %
Lymphs Abs: 0.6 10*3/uL — ABNORMAL LOW (ref 0.7–4.0)
MCH: 29.9 pg (ref 26.0–34.0)
MCHC: 30.7 g/dL (ref 30.0–36.0)
MCV: 97.5 fL (ref 80.0–100.0)
Monocytes Absolute: 1 10*3/uL (ref 0.1–1.0)
Monocytes Relative: 12 %
Neutro Abs: 6.2 10*3/uL (ref 1.7–7.7)
Neutrophils Relative %: 78 %
Platelets: 224 10*3/uL (ref 150–400)
RBC: 3.54 MIL/uL — ABNORMAL LOW (ref 3.87–5.11)
RDW: 15.4 % (ref 11.5–15.5)
WBC: 7.9 10*3/uL (ref 4.0–10.5)
nRBC: 0 % (ref 0.0–0.2)

## 2020-08-21 LAB — COMPREHENSIVE METABOLIC PANEL
ALT: 16 U/L (ref 0–44)
AST: 19 U/L (ref 15–41)
Albumin: 3.2 g/dL — ABNORMAL LOW (ref 3.5–5.0)
Alkaline Phosphatase: 85 U/L (ref 38–126)
Anion gap: 7 (ref 5–15)
BUN: 23 mg/dL (ref 8–23)
CO2: 27 mmol/L (ref 22–32)
Calcium: 9 mg/dL (ref 8.9–10.3)
Chloride: 103 mmol/L (ref 98–111)
Creatinine, Ser: 0.88 mg/dL (ref 0.44–1.00)
GFR, Estimated: >60 mL/min (ref >60)
Glucose, Bld: 68 mg/dL — ABNORMAL LOW (ref 70–99)
Potassium: 4.3 mmol/L (ref 3.5–5.1)
Sodium: 137 mmol/L (ref 135–145)
Total Bilirubin: 1.1 mg/dL (ref 0.3–1.2)
Total Protein: 7.1 g/dL (ref 6.5–8.1)

## 2020-08-21 LAB — URINALYSIS, ROUTINE W REFLEX MICROSCOPIC
Bilirubin Urine: NEGATIVE
Glucose, UA: NEGATIVE mg/dL
Hgb urine dipstick: NEGATIVE
Ketones, ur: NEGATIVE mg/dL
Leukocytes,Ua: NEGATIVE
Nitrite: NEGATIVE
Protein, ur: NEGATIVE mg/dL
Specific Gravity, Urine: 1.018 (ref 1.005–1.030)
pH: 5 (ref 5.0–8.0)

## 2020-08-21 LAB — RESP PANEL BY RT-PCR (FLU A&B, COVID) ARPGX2
Influenza A by PCR: NEGATIVE
Influenza B by PCR: NEGATIVE
SARS Coronavirus 2 by RT PCR: NEGATIVE

## 2020-08-21 LAB — TROPONIN I (HIGH SENSITIVITY): Troponin I (High Sensitivity): 7 ng/L (ref <18)

## 2020-08-21 LAB — BRAIN NATRIURETIC PEPTIDE: B Natriuretic Peptide: 261.4 pg/mL — ABNORMAL HIGH (ref 0.0–100.0)

## 2020-08-21 LAB — MRSA NEXT GEN BY PCR, NASAL: MRSA by PCR Next Gen: NOT DETECTED

## 2020-08-21 LAB — LACTIC ACID, PLASMA: Lactic Acid, Venous: 0.7 mmol/L (ref 0.5–1.9)

## 2020-08-21 MED ORDER — CEFTRIAXONE SODIUM 1 G IJ SOLR: 1.0000 g | INTRAMUSCULAR | Status: DC

## 2020-08-21 MED ORDER — CALCITRIOL 0.25 MCG PO CAPS
0.2500 ug | ORAL_CAPSULE | Freq: Two times a day (BID) | ORAL | Status: DC
  Administered 2020-08-21 – 2020-08-24 (×6): 0.25 ug via ORAL
  Filled 2020-08-21 (×7): qty 1

## 2020-08-21 MED ORDER — VANCOMYCIN HCL 1500 MG/300ML IV SOLN
1500.0000 mg | INTRAVENOUS | Status: DC
Start: 2020-08-22 — End: 2020-08-21

## 2020-08-21 MED ORDER — APIXABAN 5 MG PO TABS
5.0000 mg | ORAL_TABLET | Freq: Two times a day (BID) | ORAL | Status: DC
  Administered 2020-08-21 – 2020-08-24 (×6): 5 mg via ORAL
  Filled 2020-08-21: qty 2
  Filled 2020-08-21 (×5): qty 1

## 2020-08-21 MED ORDER — SODIUM CHLORIDE 0.9 % IV SOLN
2.0000 g | INTRAVENOUS | Status: DC
  Filled 2020-08-21: qty 20

## 2020-08-21 MED ORDER — ACETAMINOPHEN 325 MG PO TABS
650.0000 mg | ORAL_TABLET | Freq: Four times a day (QID) | ORAL | Status: DC | PRN
  Administered 2020-08-23 – 2020-08-24 (×2): 650 mg via ORAL
  Filled 2020-08-21 (×3): qty 2

## 2020-08-21 MED ORDER — RAMELTEON 8 MG PO TABS: 8.0000 mg | ORAL_TABLET | Freq: Every day | ORAL | Status: DC

## 2020-08-21 MED ORDER — SODIUM CHLORIDE 0.9 % IV SOLN
500.0000 mg | INTRAVENOUS | Status: DC
  Filled 2020-08-21: qty 500

## 2020-08-21 MED ORDER — ACETAMINOPHEN 650 MG RE SUPP: 650.0000 mg | Freq: Four times a day (QID) | RECTAL | Status: DC | PRN

## 2020-08-21 MED ORDER — OXYCODONE HCL 5 MG PO TABS
10.0000 mg | ORAL_TABLET | Freq: Four times a day (QID) | ORAL | Status: DC | PRN
  Administered 2020-08-21 – 2020-08-23 (×5): 10 mg via ORAL
  Filled 2020-08-21 (×5): qty 2

## 2020-08-21 MED ORDER — ACETAMINOPHEN 500 MG PO TABS
1000.0000 mg | ORAL_TABLET | Freq: Once | ORAL | Status: AC
  Administered 2020-08-21: 1000 mg via ORAL
  Filled 2020-08-21: qty 2

## 2020-08-21 MED ORDER — RAMELTEON 8 MG PO TABS
8.0000 mg | ORAL_TABLET | Freq: Every day | ORAL | Status: DC
  Administered 2020-08-21 – 2020-08-23 (×3): 8 mg via ORAL
  Filled 2020-08-21 (×4): qty 1

## 2020-08-21 MED ORDER — VANCOMYCIN HCL 1500 MG/300ML IV SOLN
1500.0000 mg | Freq: Once | INTRAVENOUS | Status: AC
  Administered 2020-08-21: 1500 mg via INTRAVENOUS
  Filled 2020-08-21: qty 300

## 2020-08-21 MED ORDER — TRAZODONE HCL 50 MG PO TABS
50.0000 mg | ORAL_TABLET | Freq: Every day | ORAL | Status: DC
  Administered 2020-08-21 – 2020-08-23 (×3): 50 mg via ORAL
  Filled 2020-08-21 (×3): qty 1

## 2020-08-21 MED ORDER — BENZONATATE 100 MG PO CAPS
100.0000 mg | ORAL_CAPSULE | Freq: Two times a day (BID) | ORAL | Status: DC | PRN
  Administered 2020-08-21 – 2020-08-24 (×5): 100 mg via ORAL
  Filled 2020-08-21 (×5): qty 1

## 2020-08-21 MED ORDER — CALCIUM CARBONATE ANTACID 500 MG PO CHEW
1000.0000 mg | CHEWABLE_TABLET | Freq: Two times a day (BID) | ORAL | Status: DC
  Administered 2020-08-21 – 2020-08-24 (×6): 1000 mg via ORAL
  Filled 2020-08-21 (×6): qty 5

## 2020-08-21 MED ORDER — SODIUM CHLORIDE 0.9 % IV SOLN
2.0000 g | Freq: Once | INTRAVENOUS | Status: AC
  Administered 2020-08-21: 2 g via INTRAVENOUS
  Filled 2020-08-21: qty 2

## 2020-08-21 MED ORDER — LEVOTHYROXINE SODIUM 75 MCG PO TABS
175.0000 ug | ORAL_TABLET | Freq: Every day | ORAL | Status: DC
  Administered 2020-08-22 – 2020-08-24 (×3): 175 ug via ORAL
  Filled 2020-08-21 (×3): qty 1

## 2020-08-21 MED ORDER — IOHEXOL 350 MG/ML SOLN
100.0000 mL | Freq: Once | INTRAVENOUS | Status: AC | PRN
  Administered 2020-08-21: 100 mL via INTRAVENOUS

## 2020-08-21 MED ORDER — SODIUM CHLORIDE 0.9 % IV SOLN: INTRAVENOUS | Status: DC

## 2020-08-21 NOTE — Progress Notes (Signed)
Pharmacy Antibiotic Note  Jillian Eaton is a 70 y.o. female admitted on 08/21/2020 with pneumonia.  Pharmacy has been consulted for vancomycin dosing.  Cefepime 2g has been given in the ED.  Plan: Vancomycin 1500 IV every 24 hours.  Goal trough 10-15 mcg/mL. Pharmacy to adjust per renal function Follow up MRSA PCR Follow up cultures and streamline antibiotics as appropriate     Temp (24hrs), Avg:100.2 F (37.9 C), Min:98.8 F (37.1 C), Max:101.1 F (38.4 C)  No results for input(s): WBC, CREATININE, LATICACIDVEN, VANCOTROUGH, VANCOPEAK, VANCORANDOM, GENTTROUGH, GENTPEAK, GENTRANDOM, TOBRATROUGH, TOBRAPEAK, TOBRARND, AMIKACINPEAK, AMIKACINTROU, AMIKACIN in the last 168 hours.  CrCl cannot be calculated (Patient's most recent lab result is older than the maximum 21 days allowed.).    Allergies  Allergen Reactions   Oxycodone-Acetaminophen Itching, Nausea And Vomiting, Rash and Other (See Comments)        Hydrocodone Itching        Antimicrobials this admission: cefepime 7/2 >>  vancomycin 7/2 >>   Microbiology results: Pending  Thank you for allowing pharmacy to be a part of this patient's care.  Jillian Eaton 08/21/2020 3:30 PM

## 2020-08-21 NOTE — Progress Notes (Addendum)
FPTS Brief Progress Note  S: States she would like tessalon pearls for cough but otherwise she is doing well.   O: BP 119/81   Pulse 61   Temp 99 F (37.2 C) (Oral)   Resp 18   Ht 5\' 7"  (1.702 m)   Wt 78.9 kg   SpO2 97%   BMI 27.25 kg/m   General: Appears well, no acute distress. Age appropriate. Respiratory: normal effort, speaking in full sentences without labored breathing  A/P: Acute hypoxic respiratory distress - Tessalon 100 mg BID PRN for cough - Orders reviewed. Labs for AM ordered, which was adjusted as needed.  - Will continue to monitor peripherally throughout the night   Gerlene Fee, DO 08/21/2020, 9:19 PM PGY-3, [facility] Health Family Medicine Night Resident  Please page [phone] with questions.

## 2020-08-21 NOTE — ED Notes (Signed)
PT did not want an IV started while at [facility] because IV team always usea [place] to place IV's.

## 2020-08-21 NOTE — ED Notes (Addendum)
Report called to RN receiving pt in 3E03. Question about antibiotics. Per pt, Dr. Vanessa Be[place] told her she would not receive any more antibiotics today even thought the [facility] is still showing 2 that are due. Remo Lipps, Network engineer paged family medicine to get clarity. Doctor will page the floor with instructions.

## 2020-08-21 NOTE — ED Provider Notes (Signed)
[facility]    CSN: [id number] Arrival date & time: 08/21/20  1007      History   Chief Complaint Chief Complaint  Patient presents with   Cough    HPI Jillian Eaton is a 70 y.o. female.   Patient presenting today with 1 day history of sudden onset severe left lower chest pain that she states came out of the blue yesterday.  She states that deep breaths and positional changes seem to make the pain significantly worse.  Has been having cough, shortness of breath associated.  Denies fever, chills, body aches, dizziness, syncope, abdominal pain, nausea vomiting or diarrhea.  Has not been trying anything over-the-counter for symptoms.  Called her cardiologist yesterday who interrogated her pacemaker virtually and reassured her that everything looked good there.  She has a history of CHF, atrial fibrillation, pulmonary hypertension, COPD, chronic respiratory failure with hypoxia, asthma and was hospitalized about 2 weeks ago with a small bowel obstruction.  Her baseline O2 saturation is 99%.   Past Medical History:  Diagnosis Date   Anemia    iron and pernicious   Arthritis    Asthma    Cancer ([place])    CHF (congestive heart failure) ([place]) 0626   Complication of anesthesia    BP dropped    DOE (dyspnea on exertion)    2D ECHO, 02/12/2012 - EF 60-65%, moderate concentric hypertrophy   Dyspnea    Dysrhythmia    a-fib   Fibromyalgia    nerve pain"left side at waist level" "can't lay on that side without pain" , "HOB elevation helps"; pt. thinks this has resolved after 200lb weight loss9-17- since i lost the wieght , i dont  think i have this anymore    Headache 04/2019   Heart murmur    Hematuria - cause not known    resolved    History of COVID-19    History of kidney stones    x 2 '13, '14 surgery to remove   Hypertension    Pneumonia    aspiration  04/2016   Presence of permanent cardiac pacemaker    SBO (small bowel obstruction) ([place])    rqueired  admission 2020   Thyroid disease    "goiter"   Transfusion history    10 yrs+    Patient Active Problem List   Diagnosis Date Noted   Cardiac arrhythmia 07/02/2020   Loss of appetite 07/02/2020   Personal history of colonic polyps 07/02/2020   Protein-calorie malnutrition, severe 04/26/2020   Chronic kidney disease, stage 3a ([place]) 04/22/2020   Unintentional weight loss 04/22/2020   Hypoglycemia 04/22/2020   Shortness of breath 04/21/2020   Pacemaker 09/24/2019   Iron deficiency anemia 09/15/2019   Fatigue 06/14/2019   S/P right TKA 04/29/2019   Medication management 03/11/2019   Stiffness of left knee 12/12/2018   Normocytic anemia 12/07/2018   Asthma 12/07/2018   S/P left TKA 11/14/2018   Status post total left knee replacement 11/14/2018   Degenerative joint disease involving multiple joints on both sides of body 07/31/2018   Rotator cuff tear arthropathy of right shoulder 04/05/2018   Overactive bladder 04/05/2018   Steroid-induced hyperglycemia    Generalized OA    S/P shoulder replacement, right 03/29/2018   NICM (nonischemic cardiomyopathy) ([place]) 01/15/2018   Osteoporosis 12/13/2017   Rotator cuff tear arthropathy 11/29/2017   Chronic combined systolic and diastolic congestive heart failure ([place]) 09/28/2017   Acute renal failure superimposed on stage 3 chronic kidney  disease ([place]) 09/07/2017   Hypomagnesemia with secondary hypocalcemia 09/07/2017   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   Hypercapnemia 07/13/2017   AKI (acute kidney injury) ([place]) 07/10/2017   Vertigo 07/08/2017   Blurring of visual image 07/08/2017   On anticoagulant therapy 06/19/2017   Chronic respiratory failure with hypoxia ([place]) 04/19/2017   GERD (gastroesophageal reflux disease) 08/28/2016   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   Depressive disorder 08/28/2016   Iatrogenic hypocalcemia 08/28/2016   Chronic pain syndrome    Acute diastolic (congestive) heart failure (H[facility])     Oropharyngeal dysphagia    Chronic atrial fibrillation ([place]) 04/28/2016   Vocal cord dysfunction 04/28/2016   Thrombocytopenia ([place]) 04/28/2016   Seizure disorder ([place])    Preoperative cardiovascular examination    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   Leg swelling 10/19/2015   Bilateral leg edema 05/11/2015   Essential hypertension 05/11/2015   Nephrolithiasis 04/12/2015   Numbness in both hands 04/12/2015   Complete tear of left rotator cuff 11/19/2014   Primary osteoarthritis of both knees 03/24/2014   Chronic asthmatic bronchitis ([place]) 07/30/2013   Bariatric surgery status 07/30/2013   Urge incontinence of urine 07/30/2013   Vitamin D deficiency 07/30/2013   B-complex deficiency 07/30/2013   Cough 07/30/2013   Gross hematuria 07/30/2013   Palpitations 07/30/2013   Right flank pain 07/30/2013   SBO (small bowel obstruction) ([place]) 06/07/2013   Pulmonary HTN ([place]) 01/08/2013   Insomnia 03/22/2012   Mild intermittent asthma, uncomplicated 86/57/8469   Fibromyalgia 10/26/2011   Anemia of chronic disease 03/28/2011   Obesity (BMI 30-39.9) 09/22/2010    Past Surgical History:  Procedure Laterality Date   ABDOMINAL ADHESION SURGERY  2004   open LOA - in [place]   AV NODE ABLATION N/A 06/20/2019   Procedure: AV NODE ABLATION;  Surgeon: Evans Lance, MD;  Location: [facility];  Service: Cardiovascular;  Laterality: N/A;   BALLOON DILATION N/A 07/25/2019   Procedure: BALLOON DILATION;  Surgeon: Carol Ada, MD;  Location: [facility];  Service: Endoscopy;  Laterality: N/A;   CARDIAC CATHETERIZATION  04/04/2010   No significant obstructive coronary artery disease   CARDIOVERSION N/A 08/08/2017   Procedure: CARDIOVERSION;  Surgeon: Pixie Casino, MD;  Location: [facility];  Service: Cardiovascular;  Laterality: N/A;   CARDIOVERSION N/A 06/18/2019   Procedure: CARDIOVERSION;  Surgeon: Pixie Casino, MD;  Location: [facility];  Service: Cardiovascular;   Laterality: N/A;   CHOLECYSTECTOMY  1990   COLONOSCOPY W/ POLYPECTOMY     COLONOSCOPY WITH PROPOFOL N/A 04/10/2014   Procedure: COLONOSCOPY WITH PROPOFOL;  Surgeon: Beryle Beams, MD;  Location: [facility];  Service: Endoscopy;  Laterality: N/A;   CYSTOSCOPY/URETEROSCOPY/HOLMIUM LASER/STENT PLACEMENT Right 08/13/2019   Procedure: CYSTOSCOPY/RETROGRADE/URETEROSCOPY/HOLMIUM LASER/STENT PLACEMENT;  Surgeon: Ceasar Mons, MD;  Location: [facility];  Service: Urology;  Laterality: Right;  ONLY NEEDS 45 MIN   ENTEROSCOPY N/A 08/13/2020   Procedure: ENTEROSCOPY;  Surgeon: Carol Ada, MD;  [facility];  Service: Endoscopy;  Laterality: N/A;   ESOPHAGOGASTRODUODENOSCOPY (EGD) WITH PROPOFOL N/A 07/25/2019   Procedure: ESOPHAGOGASTRODUODENOSCOPY (EGD) WITH PROPOFOL;  Surgeon: Carol Ada, MD;  [facility];  Service: Endoscopy;  Laterality: N/A;   EYE SURGERY     lasik 20-25 yrs ago   GASTROPLASTY VERTICAL BANDED  [place]   In [place] for SBO   [place]  07/25/2019   Procedure: MALONEY DILATION;  Surgeon: Carol Ada, MD;  Location: [facility];  Service: Endoscopy;;   PACEMAKER IMPLANT N/A 06/20/2019   Procedure: PACEMAKER IMPLANT;  Surgeon: Evans Lance, MD;  Location: [facility];  Service: Cardiovascular;  Laterality: N/A;   REVERSE SHOULDER ARTHROPLASTY Right 03/29/2018   Procedure: REVERSE SHOULDER ARTHROPLASTY;  Surgeon: Netta Cedars, MD;  Location: [facility];  Service: Orthopedics;  Laterality: Right;   RIGHT/LEFT HEART CATH AND CORONARY ANGIOGRAPHY N/A 04/23/2020   Procedure: RIGHT/LEFT HEART CATH AND CORONARY ANGIOGRAPHY;  Surgeon: Jolaine Artist, MD;  Location: [facility];  Service: Cardiovascular;  Laterality: N/A;   ROUX-EN-Y GASTRIC BYPASS  1992   Conversion VBG to RnYGB in [place], [place]   SHOULDER ARTHROSCOPY WITH SUBACROMIAL DECOMPRESSION  2016   Dr Jannett Celestine, [place]    SMALL INTESTINE SURGERY  2000   SBO - LOA w SB resection   TOTAL KNEE ARTHROPLASTY Left 11/14/2018   Procedure: TOTAL KNEE ARTHROPLASTY;  Surgeon: Paralee Cancel, MD;  Location[facility];  Service: Orthopedics;  Laterality: Left;  70 mins   TOTAL KNEE ARTHROPLASTY Right 04/29/2019   Procedure: TOTAL KNEE ARTHROPLASTY;  Surgeon: Paralee Cancel, MD;  Location: [facility];  Service: Orthopedics;  Laterality: Right;  70 mins   TOTAL THYROIDECTOMY  06/26/2012   Dr Celine Ahr, [facility]   TUBAL LIGATION  1986   URETHRAL DILATION  2012   w cystoscopy.  Dr Gaynelle Arabian   UTERINE FIBROID EMBOLIZATION      OB History   No obstetric history on file.      Home Medications    Prior to Admission medications   Medication Sig Start Date End Date Taking? Authorizing Provider  acetaminophen (TYLENOL) 650 MG CR tablet Take 1,300 mg by mouth every 8 (eight) hours as needed for pain.    [provider]  calcitRIOL (ROCALTROL) 0.25 MCG capsule Take 1 capsule (0.25 mcg total) by mouth 2 (two) times daily. 05/12/20   Philemon Kingdom, MD  calcium elemental as carbonate (TUMS ULTRA 1000) 400 MG chewable tablet Chew 1,000 mg by mouth 2 (two) times daily.    [provider]  celecoxib (CELEBREX) 200 MG capsule Take 400 mg by mouth daily. 08/11/20   [provider]  cyanocobalamin (,VITAMIN B-12,) 1000 MCG/ML injection Inject 1 mL (1,000 mcg total) into the muscle every 30 (thirty) days. 04/05/18   Hennie Duos, MD  cyclobenzaprine (FLEXERIL) 10 MG tablet Take 20 mg by mouth 2 (two) times daily as needed for muscle spasms (Back pain).    [provider]  Docusate Sodium (DSS) 100 MG CAPS Take 100 mg by mouth daily.    [provider]  eszopiclone (LUNESTA) 2 MG TABS tablet Take 1 tablet (2 mg total) by mouth at bedtime. 04/05/18   Hennie Duos, MD  furosemide (LASIX) 20 MG tablet Take 20 mg by mouth daily as needed for edema.    [provider]   HYDROcodone-acetaminophen (NORCO/VICODIN) 5-325 MG tablet Take 1 tablet by mouth daily as needed for pain. 08/11/20   [provider]  levothyroxine (SYNTHROID) 175 MCG tablet Take 1 tablet (175 mcg total) by mouth daily. 07/20/20   Philemon Kingdom, MD  Multiple Vitamins-Minerals (MULTIVITAMIN WITH MINERALS) tablet Take 1 tablet by mouth daily. bariatric    [provider]  Potassium 99 MG TABS Take 99 mg by mouth daily.    [provider]  TraMADol HCl 100 MG CP24 Take 50 mg by mouth 2 (two) times daily as needed.    [provider]  traZODone (DESYREL) 50 MG tablet Take 50 mg by mouth at bedtime. 06/18/20   [provider]  VITAMIN A PO Take 1 capsule by mouth daily.    [provider]  Vitamin D, Ergocalciferol, (DRISDOL) 1.25 MG (50000 UT) CAPS capsule Take 1 capsule (50,000 Units total) by mouth every Wednesday. 04/10/18   Hennie Duos, MD    Family History Family History  Problem Relation Age of Onset   Diabetes Mother    Epilepsy Mother    Cancer Mother        Breast   Hypertension Mother    Breast cancer Mother    Seizures Mother    Kidney disease Father    Diabetes Father    Hypertension Father    Asthma Father    Heart disease Father    Epilepsy Sister    Cancer Maternal Grandmother    Breast cancer Maternal Grandmother    Cancer Paternal Grandmother    Breast cancer Paternal Grandmother     Social History Social History   Tobacco Use   Smoking status: Never   Smokeless tobacco: Never  Vaping Use   Vaping Use: Never used  Substance Use Topics   Alcohol use: No    Comment: wine occ   Drug use: No    Types: Oxycodone    Comment: perscribed     Allergies   Oxycodone-acetaminophen and Hydrocodone   Review of Systems Review of Systems Per HPI  Physical Exam Triage Vital Signs ED Triage Vitals  Enc Vitals Group     BP 08/21/20 1034 125/81     Pulse Rate 08/21/20 1034 75     Resp 08/21/20 1034  (!) 22     Temp 08/21/20 1034 98.8 F (37.1 C)     Temp Source 08/21/20 1034 Oral     SpO2 08/21/20 1034 90 %     Weight --      Height --      Head Circumference --      Peak Flow --      Pain Score 08/21/20 1039 10     Pain Loc --      Pain Edu? --      Excl. in [place]? --    No data found.  Updated Vital Signs BP 125/81   Pulse 75   Temp 98.8 F (37.1 C) (Oral)   Resp 20   SpO2 96%   Visual Acuity Right Eye Distance:   Left Eye Distance:   Bilateral Distance:    Right Eye Near:   Left Eye Near:    Bilateral Near:     Physical Exam Vitals and nursing note reviewed.  Constitutional:      Appearance: She is not diaphoretic.     Comments: Shallow breathing, appears uncomfortable  HENT:     Head: Atraumatic.     Nose: Nose normal.     Mouth/Throat:     Mouth: Mucous membranes are moist.     Pharynx: Oropharynx is clear.  Eyes:     Extraocular Movements: Extraocular movements intact.     Conjunctiva/sclera: Conjunctivae normal.  Cardiovascular:     Rate and Rhythm: Normal rate.     Comments: Paced rhythm Pulmonary:     Comments: Shallow breaths, mildly increased labor of breathing, tachypnea, decreased breath sounds and rales left lower lobe region Abdominal:     General: Bowel sounds are normal.  Musculoskeletal:        General: Normal range of motion.  Cervical back: Normal range of motion and neck supple.  Skin:    General: Skin is warm and dry.  Neurological:     Mental Status: She is alert and oriented to person, place, and time.     Motor: No weakness.     Gait: Gait normal.  Psychiatric:        Mood and Affect: Mood normal.        Thought Content: Thought content normal.        Judgment: Judgment normal.     UC Treatments / Results  Labs (all labs ordered are listed, but only abnormal results are displayed) Labs Reviewed - No data to display  EKG   Radiology DG Chest 2 View  Result Date: 08/21/2020 CLINICAL DATA:  70 year old female  with shortness of breath, left side chest pain for 2 days. EXAM: CHEST - 2 VIEW COMPARISON:  Chest radiographs 04/21/2020 and earlier. FINDINGS: PA and lateral views of the chest today. Stable single lead left chest cardiac pacemaker. Stable cardiomegaly and mediastinal contours. Visualized tracheal air column is within normal limits. The right lung remains clear but there is confluent new left lung base opacity which on the lateral view most resembles pleural effusion. No pulmonary edema or pneumothorax. Right shoulder arthroplasty. No acute osseous abnormality identified. Numerous upper abdominal surgical clips and the visible bowel gas pattern are stable since February. IMPRESSION: 1. Confluent new left lung base opacity since March most resembles pleural effusion on the lateral view. However, confirmation of pleural fluid with CT or Ultrasound is recommended prior to attempted thoracentesis. 2. Otherwise stable cardiomegaly.  No pulmonary edema. Electronically Signed   By: Genevie Ann M.D.   On: 08/21/2020 11:15    Procedures Procedures (including critical care time)  Medications Ordered in UC Medications - No data to display  Initial Impression / Assessment and Plan / UC Course  I have reviewed the triage vital signs and the nursing notes.  Pertinent labs & imaging results that were available during my care of the patient were reviewed by me and considered in my medical decision making (see chart for details).     Oxygen saturation today was varying between 86% to 90% depending upon if she was speaking or ambulating compared to rest.  She was placed on 2 L of oxygen via nasal cannula and her oxygen saturation went up to 96%.  She was tachypneic to 22 breaths/min in triage.  Otherwise her vital signs were within normal limits.  EKG today stable from previous and cardiology was able to interrogate pacemaker yesterday and reassure patient that everything looked normal there.  Chest x-ray today showing a  large left lower lung effusion.  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  EMS arrived for transport and took [facility].  Final Clinical Impressions(s) / UC Diagnoses   Final diagnoses:  Acute respiratory failure with hypoxia (HCC)  Chest pain, unspecified type  Pleural effusion  Tachypnea   Discharge Instructions   None    ED Prescriptions   None    PDMP not reviewed this encounter.   Volney American, [place] 08/21/20 1501

## 2020-08-21 NOTE — ED Notes (Signed)
Charge nurse was not able to start IV on pt, PA and EMS aware. Pt states she always needs ultrasound to have an IV.

## 2020-08-21 NOTE — ED Notes (Signed)
EMS, called. Emergency transport, cardiac monitor requested.   ED charge nurse called and reported pt will go by EMS. Pt has hypoxia and severe left lung pain.

## 2020-08-21 NOTE — ED Triage Notes (Signed)
Pt brought to ED via EMS from urgent care with c/o shortness of breath. Per EMS, urgent care xray showed left sided pleural effusion. 88% on room air in UC, 100% on 3L [place]. Patient alert and oriented x 4 in ED. Pacemaker present.  EMS v/s: 118/72 BP 60 HR 18 RR

## 2020-08-21 NOTE — H&P (Addendum)
[facility] Admission History and Physical Service Pager: [phone]  Patient name: Jillian Eaton [facility] Medical record number: [id number] Date of birth: November 30, 1950 Age: 70 y.o. Gender: female  Primary Care Provider: [name], MD Consultants: None Code Status: Full  Preferred Emergency Contact: Casimer Leek - [phone] - Niece  Chief Complaint: shortness of breath with cough, chest pain and fever  Assessment and Plan: Jillian Eaton is a 70 y.o. female presenting with shortness of breath, increased cough and left sided chest pain since yesterday. She states her cardiologist interrogated her pace maker and said "everything was good". Today she developed a fever, went to urgent care where her oxygen saturation was 88%, was sent to ED and found to have a L sided pleural effusion. PMH is significant for CHF, pace maker, a fib, pulmonary HTN, COPD, chronic respiratory failure with hypoxia, and asthma. She was hospitalized here a couple weeks ago for a bowel obstruction.  She left AMA.  Acute Hypoxic Respiratory Distress  Patient states that yesterday she noticed increased cough and dyspnea.  She reached out to cardiology and had her pacer interrogated and states it was "normal".  Cardiology recommended she go to the urgent care.  She was noted to be hypoxic and was sent to the emergency department.  May be related to pleural effusion, see below.  Pt began to feel SOB accompanied by chest pain and cough yesterday and fever today. Her O2 sat was 88% at urgent care today and CXR is suggestive of pleural effusion. She was sent to the ED where it improved to 100% with 3 L O2. She does not use O2 at base line. She was started on cefepime and vancomycin in ED. CBC and lactic acid WNL. Chest x-ray also shows a confluent new left lung base opacity since March.  Patient is abrupt onset of cough and shortness of breath with some left-sided chest pain did  increase concern for a pulmonary embolism.  To rule out PE CTA was ordered.  CTA notes left lower lobe atelectasis versus infiltrate and patient was febrile on admission to the emergency department.  She was given one dose of cefepime in ED incase of HCAP due to recent stay in hospital. Etiology for patient's acute hypoxic respiratory distress can include the left-sided pleural effusion, concern for community-acquired pneumonia, cardiac etiology, CTA did not show any pulmonary embolism.  Troponins are pending. -Admit to med-telemetry, attending Dr. Owens Shark -Continuous cardiac monitoring -Troponins pending -We will switch antibiotics to IV Ceftriaxone daily and IV Azithromycin 500 mg daily for coverage of CAP. -BMP and hepatic function tomorrow morning -AM lactate dehydrogenase -CBC tomorrow morning -Follow-up blood cultures  -MRSA next gen PCR ordered -2 L O2, continuous. Keep O2 sat >92% -Monitor vitals -Continuous pulse oximetry -PT/OT evaluate and treat -Monitor fever curve  Pleural Effusion Left sided pleural effusion seen on CRX at urgent care.  This was also demonstrated on x-ray in the emergency department as well as on CTA.  It is in the location of the patient's discomfort in her last chest.  Physical exam does show decreased breath sounds in the left lower field.  May need thoracentesis. May be the cause of her respiratory distress.  -Consider consult for thoracentesis on 7/3  Atrial Fibrillation Pt had AV node ablation and pacemaker implant due to atrial fibrillation.  Home medications include Eliquis 5 mg twice daily.  Rates have been controlled in the 60s-70s since admission.  Patient states that her pacer was  interrogated by cardiology yesterday and was "normal". -Continue home Eliquis 5 mg BID -Continuous cardiac monitoring  Hx of Congestive heart failure  Most recent echocardiogram from 03/22 shows EF of 55-60%. Patient says it improved after her pacemaker. She was  hospitalized in 04/2020 for shortness of breath and chest pain and had a heart cath showing patent coronaries and no pulmonary HTN. She is pacer dependent after AV node ablation for her a fib.  -BNP pending  Hx bariatric surgery 1992 States she was "never on vitamins after that" until March of this year.  Has had profoundly low calcium levels upon review over the past several years from time to time.  Current medications include calcitriol 0.25 mcg daily as well as calcium carbonate 1000 mg twice daily. -Continue home calcitriol 0.25 mcg -Continue calcium carbonate  Low back pain Follows with orthopedics. Has had low back pain for years but has been worsening. She uses celebrex for the past 1 week, norco 10-325mg  for one week and flexeril PRN. -Oxycodone 10 mg q6h prn -Tylenol  625 mg q6h prn -We will hold Celebrex and Flexeril.  Recommend holding Celebrex on discharge.  Flexeril is a beers medication so also recommend discussion at discharge whether this is appropriate  History of hypothyroidism Most recent TSH within normal limits at 1.88 yesterday.  Home medications include levothyroxine 175 mcg/day. - Continue home levothyroxine 175 mcg daily  Insomnia Patient states that she takes trazodone as well as lunesta at night for difficulty sleeping. -Continue home trazodone -Holding home lunesta -Consider ramelteon for insomnia.  Hypertension Blood pressures since presentation to the ED mostly normotensive with a few elevated diastolics in the 48N.  Currently takes no medications.  History of seizure disorder Noted in patient's chart.  Upon asking about her medical history she did not mention this.  She is not on any medications for this per her chart or per her recollection of current medications.  Documented of having a seizure in April 2020.  Also has documentation of discontinuing Keppra -Can obtain additional history from patient about her history of seizure disorder on next encounter  with her  FEN/GI: Regular Diet Prophylaxis: Eliquis  Disposition: Med tele  History of Present Illness: Jillian Eaton is a 70 y.o. female presenting with shortness of breath, increased cough and left sided chest pain since yesterday. She states her cardiologist interrogated her pace maker and said "everything was good". Today she developed a fever, went to urgent care where her oxygen saturation was 88%, was sent to ED and found to have a L sided pleural effusion. PMH is significant for CHF, pace maker, a fib, pulmonary HTN, COPD, chronic respiratory failure with hypoxia, and asthma. She was hospitalized here a couple weeks ago for a bowel obstruction.  She left AMA.  Was admitted with 2 weeks ago when in [place] for small bowel obstruction that "resolved" in the hospital.   Tobacco: Never used tobacco  Alcohol: Has a glass of wine on rare occasion No illicit drugs.   Review Of Systems: Per HPI with the following additions:   Review of Systems  Constitutional:  Positive for chills and fever.  HENT:  Negative for congestion and sore throat.   Respiratory:  Positive for cough and shortness of breath. Negative for chest tightness.   Cardiovascular:  Positive for chest pain.  Gastrointestinal:  Negative for abdominal pain, constipation, diarrhea, nausea and vomiting.  Genitourinary:  Negative for difficulty urinating and dysuria.  Musculoskeletal:  Positive for back pain.  Neurological:  Negative for dizziness and headaches.    Patient Active Problem List   Diagnosis Date Noted   Cardiac arrhythmia 07/02/2020   Loss of appetite 07/02/2020   Personal history of colonic polyps 07/02/2020   Protein-calorie malnutrition, severe 04/26/2020   Chronic kidney disease, stage 3a ([place]) 04/22/2020   Unintentional weight loss 04/22/2020   Hypoglycemia 04/22/2020   Shortness of breath 04/21/2020   Pacemaker 09/24/2019   Iron deficiency anemia 09/15/2019   Fatigue 06/14/2019   S/P  right TKA 04/29/2019   Medication management 03/11/2019   Stiffness of left knee 12/12/2018   Normocytic anemia 12/07/2018   Asthma 12/07/2018   S/P left TKA 11/14/2018   Status post total left knee replacement 11/14/2018   Degenerative joint disease involving multiple joints on both sides of body 07/31/2018   Rotator cuff tear arthropathy of right shoulder 04/05/2018   Overactive bladder 04/05/2018   Steroid-induced hyperglycemia    Generalized OA    S/P shoulder replacement, right 03/29/2018   NICM (nonischemic cardiomyopathy) ([place]) 01/15/2018   Osteoporosis 12/13/2017   Rotator cuff tear arthropathy 11/29/2017   Chronic combined systolic and diastolic congestive heart failure ([place]) 09/28/2017   Acute renal failure superimposed on stage 3 chronic kidney disease ([place]) 09/07/2017   Hypomagnesemia with secondary hypocalcemia 09/07/2017   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[place]) 08/03/2017   Hypercapnemia 07/13/2017   AKI (acute kidney injury) ([place]) 07/10/2017   Vertigo 07/08/2017   Blurring of visual image 07/08/2017   On anticoagulant therapy 06/19/2017   Chronic respiratory failure with hypoxia ([place]) 04/19/2017   GERD (gastroesophageal reflux disease) 08/28/2016   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   Depressive disorder 08/28/2016   Iatrogenic hypocalcemia 08/28/2016   Chronic pain syndrome    Acute diastolic (congestive) heart failure (HCC)    Oropharyngeal dysphagia    Chronic atrial fibrillation ([place]) 04/28/2016   Vocal cord dysfunction 04/28/2016   Thrombocytopenia ([place]) 04/28/2016   Seizure disorder ([place])    Preoperative cardiovascular examination    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   Leg swelling 10/19/2015   Bilateral leg edema 05/11/2015   Essential hypertension 05/11/2015   Nephrolithiasis 04/12/2015   Numbness in both hands 04/12/2015   Complete tear of left rotator cuff 11/19/2014   Primary osteoarthritis of both knees 03/24/2014   Chronic  asthmatic bronchitis ([place]) 07/30/2013   Bariatric surgery status 07/30/2013   Urge incontinence of urine 07/30/2013   Vitamin D deficiency 07/30/2013   B-complex deficiency 07/30/2013   Cough 07/30/2013   Gross hematuria 07/30/2013   Palpitations 07/30/2013   Right flank pain 07/30/2013   SBO (small bowel obstruction) ([place]) 06/07/2013   Pulmonary HTN ([place]) 01/08/2013   Insomnia 03/22/2012   Mild intermittent asthma, uncomplicated 69/62/9528   Fibromyalgia 10/26/2011   Anemia of chronic disease 03/28/2011   Obesity (BMI 30-39.9) 09/22/2010    Past Medical History: Past Medical History:  Diagnosis Date   Anemia    iron and pernicious   Arthritis    Asthma    Cancer ([place])    CHF (congestive heart failure) ([place]) 4132   Complication of anesthesia    BP dropped    DOE (dyspnea on exertion)    2D ECHO, 02/12/2012 - EF 60-65%, moderate concentric hypertrophy   Dyspnea    Dysrhythmia    a-fib   Fibromyalgia    nerve pain"left side at waist level" "can't lay on that side without pain" , "HOB elevation helps"; pt. thinks this has resolved after 200lb weight loss9-17- since  i lost the wieght , i dont  think i have this anymore    Headache 04/2019   Heart murmur    Hematuria - cause not known    resolved    History of COVID-19    History of kidney stones    x 2 '13, '14 surgery to remove   Hypertension    Pneumonia    aspiration  04/2016   Presence of permanent cardiac pacemaker    SBO (small bowel obstruction) ([place])    rqueired admission 2020   Thyroid disease    "goiter"   Transfusion history    10 yrs+    Past Surgical History: Past Surgical History:  Procedure Laterality Date   ABDOMINAL ADHESION SURGERY  2004   open LOA - in [place]   AV NODE ABLATION N/A 06/20/2019   Procedure: AV NODE ABLATION;  Surgeon: Evans Lance, MD;  Location: [place];  Service: Cardiovascular;  Laterality: N/A;   BALLOON DILATION N/A 07/25/2019   Procedure: BALLOON DILATION;   Surgeon: Carol Ada, MD;  Location: [facility];  Service: Endoscopy;  Laterality: N/A;   CARDIAC CATHETERIZATION  04/04/2010   No significant obstructive coronary artery disease   CARDIOVERSION N/A 08/08/2017   Procedure: CARDIOVERSION;  Surgeon: Pixie Casino, MD;  Location: [facility];  Service: Cardiovascular;  Laterality: N/A;   CARDIOVERSION N/A 06/18/2019   Procedure: CARDIOVERSION;  Surgeon: Pixie Casino, MD;  Location: [facility];  Service: Cardiovascular;  Laterality: N/A;   CHOLECYSTECTOMY  1990   COLONOSCOPY W/ POLYPECTOMY     COLONOSCOPY WITH PROPOFOL N/A 04/10/2014   Procedure: COLONOSCOPY WITH PROPOFOL;  Surgeon: Beryle Beams, MD;  Location: [facility];  Service: Endoscopy;  Laterality: N/A;   CYSTOSCOPY/URETEROSCOPY/HOLMIUM LASER/STENT PLACEMENT Right 08/13/2019   Procedure: CYSTOSCOPY/RETROGRADE/URETEROSCOPY/HOLMIUM LASER/STENT PLACEMENT;  Surgeon: Ceasar Mons, MD;  Location: [facility];  Service: Urology;  Laterality: Right;  ONLY NEEDS 45 MIN   ENTEROSCOPY N/A 08/13/2020   Procedure: ENTEROSCOPY;  Surgeon: Carol Ada, MD;  [facility];  Service: Endoscopy;  Laterality: N/A;   ESOPHAGOGASTRODUODENOSCOPY (EGD) WITH PROPOFOL N/A 07/25/2019   Procedure: ESOPHAGOGASTRODUODENOSCOPY (EGD) WITH PROPOFOL;  Surgeon: Carol Ada, MD;  [facility];  Service: Endoscopy;  Laterality: N/A;   EYE SURGERY     lasik 20-25 yrs ago   GASTROPLASTY VERTICAL BANDED  [place]   In [place] for SBO   [place]  07/25/2019   Procedure: MALONEY DILATION;  Surgeon: Carol Ada, MD;  Location: [facility];  Service: Endoscopy;;   PACEMAKER IMPLANT N/A 06/20/2019   Procedure: PACEMAKER IMPLANT;  Surgeon: Evans Lance, MD;  Location: [place] [facility];  Service: Cardiovascular;  Laterality: N/A;   REVERSE SHOULDER ARTHROPLASTY Right 03/29/2018   Procedure: REVERSE SHOULDER ARTHROPLASTY;  Surgeon:  Netta Cedars, MD;  Location: [facility];  Service: Orthopedics;  Laterality: Right;   RIGHT/LEFT HEART CATH AND CORONARY ANGIOGRAPHY N/A 04/23/2020   Procedure: RIGHT/LEFT HEART CATH AND CORONARY ANGIOGRAPHY;  Surgeon: Jolaine Artist, MD;  Location: [facility] [facility];  Service: Cardiovascular;  Laterality: N/A;   ROUX-EN-Y GASTRIC BYPASS  1992   Conversion VBG to RnYGB in [place], [place]   SHOULDER ARTHROSCOPY WITH SUBACROMIAL DECOMPRESSION  2016   Dr Jannett Celestine, [place]   SMALL INTESTINE SURGERY  2000   SBO - LOA w SB resection   TOTAL KNEE ARTHROPLASTY Left 11/14/2018   Procedure: TOTAL KNEE ARTHROPLASTY;  Surgeon: Alvan Dame,  Rodman Key, MD;  Location: [facility];  Service: Orthopedics;  Laterality: Left;  70 mins   TOTAL KNEE ARTHROPLASTY Right 04/29/2019   Procedure: TOTAL KNEE ARTHROPLASTY;  Surgeon: Paralee Cancel, MD;  Location: [facility];  Service: Orthopedics;  Laterality: Right;  70 mins   TOTAL THYROIDECTOMY  06/26/2012   Dr Celine Ahr, [facility]   TUBAL LIGATION  1986   URETHRAL DILATION  2012   w cystoscopy.  Dr Gaynelle Arabian   UTERINE FIBROID EMBOLIZATION      Social History: Social History   Tobacco Use   Smoking status: Never   Smokeless tobacco: Never  Vaping Use   Vaping Use: Never used  Substance Use Topics   Alcohol use: No    Comment: wine occ   Drug use: No    Types: Oxycodone    Comment: perscribed    Please also refer to relevant sections of EMR.  Family History: Family History  Problem Relation Age of Onset   Diabetes Mother    Epilepsy Mother    Cancer Mother        Breast   Hypertension Mother    Breast cancer Mother    Seizures Mother    Kidney disease Father    Diabetes Father    Hypertension Father    Asthma Father    Heart disease Father    Epilepsy Sister    Cancer Maternal Grandmother    Breast cancer Maternal Grandmother    Cancer Paternal Grandmother    Breast cancer Paternal Grandmother      Allergies and Medications: Allergies   Allergen Reactions   Oxycodone-Acetaminophen Itching, Nausea And Vomiting, Rash and Other (See Comments)        Hydrocodone Itching        No current facility-administered medications on file prior to encounter.   Current Outpatient Medications on File Prior to Encounter  Medication Sig Dispense Refill   acetaminophen (TYLENOL) 650 MG CR tablet Take 1,300 mg by mouth every 8 (eight) hours as needed for pain.     calcitRIOL (ROCALTROL) 0.25 MCG capsule Take 1 capsule (0.25 mcg total) by mouth 2 (two) times daily. 180 capsule 3   calcium elemental as carbonate (TUMS ULTRA 1000) 400 MG chewable tablet Chew 1,000 mg by mouth 2 (two) times daily.     celecoxib (CELEBREX) 200 MG capsule Take 400 mg by mouth daily.     cyanocobalamin (,VITAMIN B-12,) 1000 MCG/ML injection Inject 1 mL (1,000 mcg total) into the muscle every 30 (thirty) days. 1 mL 0   cyclobenzaprine (FLEXERIL) 10 MG tablet Take 20 mg by mouth 2 (two) times daily as needed for muscle spasms (Back pain).     Docusate Sodium (DSS) 100 MG CAPS Take 100 mg by mouth daily.     eszopiclone (LUNESTA) 2 MG TABS tablet Take 1 tablet (2 mg total) by mouth at bedtime. 20 tablet 0   furosemide (LASIX) 20 MG tablet Take 20 mg by mouth daily as needed for edema.     HYDROcodone-acetaminophen (NORCO/VICODIN) 5-325 MG tablet Take 1 tablet by mouth daily as needed for pain.     levothyroxine (SYNTHROID) 175 MCG tablet Take 1 tablet (175 mcg total) by mouth daily. 45 tablet 3   Multiple Vitamins-Minerals (MULTIVITAMIN WITH MINERALS) tablet Take 1 tablet by mouth daily. bariatric     Potassium 99 MG TABS Take 99 mg by mouth daily.     TraMADol HCl 100 MG CP24 Take 50 mg by mouth 2 (two) times daily  as needed.     traZODone (DESYREL) 50 MG tablet Take 50 mg by mouth at bedtime.     VITAMIN A PO Take 1 capsule by mouth daily.     Vitamin D, Ergocalciferol, (DRISDOL) 1.25 MG (50000 UT) CAPS capsule Take 1 capsule (50,000 Units total) by mouth every  Wednesday. 4 capsule 0    Objective: BP 102/90   Pulse 60   Temp (!) 100.6 F (38.1 C)   Resp 19   Ht 5\' 7"  (1.702 m)   Wt 78.9 kg   SpO2 98%   BMI 27.25 kg/m  Exam: General: Alert, NAD Eyes: no redness, EOEM intact ENTM: no erythema or swelling of naso pharynx, moist buccal mucosa Neck: supple Cardiovascular: Regular rate, abnormal heart sounds due to pacer. Respiratory: Expiratory wheezes in all lung fields, mildly decreased lung sounds in the left lower field Gastrointestinal: soft, non tender, non distended MSK: good tone Neuro: Cranial nerves grossly intact, alert and oriented x3 Psych: normal affect for situation  Labs and Imaging: CBC BMET  Recent Labs  Lab 08/21/20 1449  WBC 7.9  HGB 10.6*  HCT 34.5*  PLT 224   No results for input(s): NA, K, CL, CO2, BUN, CREATININE, GLUCOSE, CALCIUM in the last 168 hours.   EKG: My own interpretation Paced rhythm at 62 bpm, no ST changes    Precious Gilding, DO 08/21/2020, 4:01 PM PGY-1, [place] Intern pager: [phone], text pages welcome    Upper Level Addendum:  I have seen and evaluated this patient along with Dr. Ronnald Ramp and reviewed the above note, making necessary revisions as appropriate in green.  I agree with the medical decision making and physical exam as noted above.  Lurline Del, DO PGY-3 [facility] Res[facility]

## 2020-08-21 NOTE — ED Triage Notes (Signed)
Pt cough, shortness of breath and left "lung pain: x 1 day. "Is not chest pain in left lung pain". Reports she has a pacemaker, she sent the pacemaker transmission yesterday and was told everything looks fine.  Reports left lung pain is worse when bending over or taking a deep breath. Denies vision changes  nausea, vomiting

## 2020-08-22 ENCOUNTER — Encounter (HOSPITAL_COMMUNITY): Payer: Self-pay | Admitting: Family Medicine

## 2020-08-22 ENCOUNTER — Observation Stay (HOSPITAL_COMMUNITY): Payer: Medicare Other

## 2020-08-22 DIAGNOSIS — Y95 Nosocomial condition: Secondary | ICD-10-CM | POA: Diagnosis present

## 2020-08-22 DIAGNOSIS — Z96653 Presence of artificial knee joint, bilateral: Secondary | ICD-10-CM | POA: Diagnosis present

## 2020-08-22 DIAGNOSIS — A419 Sepsis, unspecified organism: Secondary | ICD-10-CM | POA: Diagnosis present

## 2020-08-22 DIAGNOSIS — D631 Anemia in chronic kidney disease: Secondary | ICD-10-CM | POA: Diagnosis present

## 2020-08-22 DIAGNOSIS — J90 Pleural effusion, not elsewhere classified: Secondary | ICD-10-CM | POA: Diagnosis not present

## 2020-08-22 DIAGNOSIS — R0902 Hypoxemia: Secondary | ICD-10-CM | POA: Diagnosis not present

## 2020-08-22 DIAGNOSIS — D638 Anemia in other chronic diseases classified elsewhere: Secondary | ICD-10-CM

## 2020-08-22 DIAGNOSIS — J189 Pneumonia, unspecified organism: Secondary | ICD-10-CM

## 2020-08-22 DIAGNOSIS — I130 Hypertensive heart and chronic kidney disease with heart failure and stage 1 through stage 4 chronic kidney disease, or unspecified chronic kidney disease: Secondary | ICD-10-CM | POA: Diagnosis present

## 2020-08-22 DIAGNOSIS — Z96611 Presence of right artificial shoulder joint: Secondary | ICD-10-CM | POA: Diagnosis present

## 2020-08-22 DIAGNOSIS — R0602 Shortness of breath: Secondary | ICD-10-CM | POA: Diagnosis present

## 2020-08-22 DIAGNOSIS — I272 Pulmonary hypertension, unspecified: Secondary | ICD-10-CM | POA: Diagnosis present

## 2020-08-22 DIAGNOSIS — Z8616 Personal history of COVID-19: Secondary | ICD-10-CM | POA: Diagnosis not present

## 2020-08-22 DIAGNOSIS — M545 Low back pain, unspecified: Secondary | ICD-10-CM | POA: Diagnosis present

## 2020-08-22 DIAGNOSIS — J440 Chronic obstructive pulmonary disease with acute lower respiratory infection: Secondary | ICD-10-CM | POA: Diagnosis present

## 2020-08-22 DIAGNOSIS — I480 Paroxysmal atrial fibrillation: Secondary | ICD-10-CM | POA: Diagnosis not present

## 2020-08-22 DIAGNOSIS — G894 Chronic pain syndrome: Secondary | ICD-10-CM | POA: Diagnosis present

## 2020-08-22 DIAGNOSIS — I482 Chronic atrial fibrillation, unspecified: Secondary | ICD-10-CM | POA: Diagnosis present

## 2020-08-22 DIAGNOSIS — J9811 Atelectasis: Secondary | ICD-10-CM | POA: Diagnosis not present

## 2020-08-22 DIAGNOSIS — Z9049 Acquired absence of other specified parts of digestive tract: Secondary | ICD-10-CM | POA: Diagnosis not present

## 2020-08-22 DIAGNOSIS — N182 Chronic kidney disease, stage 2 (mild): Secondary | ICD-10-CM | POA: Diagnosis present

## 2020-08-22 DIAGNOSIS — I5042 Chronic combined systolic (congestive) and diastolic (congestive) heart failure: Secondary | ICD-10-CM | POA: Diagnosis present

## 2020-08-22 DIAGNOSIS — Z950 Presence of cardiac pacemaker: Secondary | ICD-10-CM | POA: Diagnosis not present

## 2020-08-22 DIAGNOSIS — J9621 Acute and chronic respiratory failure with hypoxia: Secondary | ICD-10-CM | POA: Diagnosis present

## 2020-08-22 DIAGNOSIS — E890 Postprocedural hypothyroidism: Secondary | ICD-10-CM | POA: Diagnosis present

## 2020-08-22 DIAGNOSIS — Z20822 Contact with and (suspected) exposure to covid-19: Secondary | ICD-10-CM | POA: Diagnosis present

## 2020-08-22 DIAGNOSIS — Z9884 Bariatric surgery status: Secondary | ICD-10-CM | POA: Diagnosis not present

## 2020-08-22 DIAGNOSIS — J4520 Mild intermittent asthma, uncomplicated: Secondary | ICD-10-CM | POA: Diagnosis present

## 2020-08-22 DIAGNOSIS — G40909 Epilepsy, unspecified, not intractable, without status epilepticus: Secondary | ICD-10-CM | POA: Diagnosis present

## 2020-08-22 DIAGNOSIS — I428 Other cardiomyopathies: Secondary | ICD-10-CM | POA: Diagnosis present

## 2020-08-22 LAB — BASIC METABOLIC PANEL
Anion gap: 6 (ref 5–15)
BUN: 18 mg/dL (ref 8–23)
CO2: 28 mmol/L (ref 22–32)
Calcium: 8.6 mg/dL — ABNORMAL LOW (ref 8.9–10.3)
Chloride: 102 mmol/L (ref 98–111)
Creatinine, Ser: 0.91 mg/dL (ref 0.44–1.00)
GFR, Estimated: >60 mL/min (ref >60)
Glucose, Bld: 104 mg/dL — ABNORMAL HIGH (ref 70–99)
Potassium: 4.4 mmol/L (ref 3.5–5.1)
Sodium: 136 mmol/L (ref 135–145)

## 2020-08-22 LAB — HEPATIC FUNCTION PANEL
ALT: 15 U/L (ref 0–44)
AST: 23 U/L (ref 15–41)
Albumin: 2.8 g/dL — ABNORMAL LOW (ref 3.5–5.0)
Alkaline Phosphatase: 82 U/L (ref 38–126)
Bilirubin, Direct: 0.3 mg/dL — ABNORMAL HIGH (ref 0.0–0.2)
Indirect Bilirubin: 0.5 mg/dL (ref 0.3–0.9)
Total Bilirubin: 0.8 mg/dL (ref 0.3–1.2)
Total Protein: 6.4 g/dL — ABNORMAL LOW (ref 6.5–8.1)

## 2020-08-22 LAB — LACTATE DEHYDROGENASE: LDH: 179 U/L (ref 98–192)

## 2020-08-22 LAB — CBC
HCT: 32.7 % — ABNORMAL LOW (ref 36.0–46.0)
Hemoglobin: 10.5 g/dL — ABNORMAL LOW (ref 12.0–15.0)
MCH: 30.4 pg (ref 26.0–34.0)
MCHC: 32.1 g/dL (ref 30.0–36.0)
MCV: 94.8 fL (ref 80.0–100.0)
Platelets: 246 10*3/uL (ref 150–400)
RBC: 3.45 MIL/uL — ABNORMAL LOW (ref 3.87–5.11)
RDW: 15.1 % (ref 11.5–15.5)
WBC: 7.7 10*3/uL (ref 4.0–10.5)
nRBC: 0 % (ref 0.0–0.2)

## 2020-08-22 MED ORDER — ADULT MULTIVITAMIN W/MINERALS CH
1.0000 | ORAL_TABLET | Freq: Every day | ORAL | Status: DC
  Administered 2020-08-22 – 2020-08-24 (×3): 1 via ORAL
  Filled 2020-08-22 (×3): qty 1

## 2020-08-22 MED ORDER — SODIUM CHLORIDE 0.9 % IV SOLN
2.0000 g | INTRAVENOUS | Status: DC
  Administered 2020-08-22 – 2020-08-24 (×3): 2 g via INTRAVENOUS
  Filled 2020-08-22 (×3): qty 20

## 2020-08-22 MED ORDER — GUAIFENESIN 200 MG PO TABS
200.0000 mg | ORAL_TABLET | ORAL | Status: DC | PRN
  Administered 2020-08-22 – 2020-08-24 (×6): 200 mg via ORAL
  Filled 2020-08-22 (×8): qty 1

## 2020-08-22 MED ORDER — MENTHOL 3 MG MT LOZG
1.0000 | LOZENGE | OROMUCOSAL | Status: DC | PRN
  Administered 2020-08-22: 3 mg via ORAL
  Filled 2020-08-22: qty 9

## 2020-08-22 MED ORDER — SODIUM CHLORIDE 0.9 % IV SOLN
500.0000 mg | INTRAVENOUS | Status: DC
  Administered 2020-08-22 – 2020-08-24 (×3): 500 mg via INTRAVENOUS
  Filled 2020-08-22 (×3): qty 500

## 2020-08-22 NOTE — Progress Notes (Signed)
Patient takes colace daily and would like it to be added to her MAR.

## 2020-08-22 NOTE — Progress Notes (Signed)
Family Medicine Teaching Service Daily Progress Note Intern Pager: [phone]  Patient name: Megen Madewell [facility] Medical record number: [id number] Date of birth: 1951/01/04 Age: 70 y.o. Gender: female  Primary Care Provider: [name], MD Consultants: Pulmonology Code Status: Full   Pt Overview and Major Events to Date:  Lean Jaeger is a 70yo female with a history of CHF, Afib, pacemaker, Pulmonary HTN, COPD,  and asthma who presented with one day of SOB, increased cough, and L sided chest pain.   Assessment and Plan:  Acute Hypoxic Respiratory Distress L Pleural Effusion  COPD  Asthma  Pulmonary HTN  CAP Symptomatically improving. Maintaining O2 sats on RA this morning after requiring 3L [place] yesterday. Reports continued painful, non-productive cough. Afebrile since midday yesterday. Troponins normal. Follows with [facility] for asthma/pulmonary HTN. - Consult to pulmonary for possible thoracentesis and further recs given her history - Continue CTX and Azithromycin for CAP coverage - Tessalon PRN and cepacol lozenge PRN for cough  Atrial Fibrillation Pt with history of AV node ablation and pacemeaker. On Eliquis 5mg  BID at home. Currently in paced ventricular rhythm.  - Eliquis 5mg  BID - Cardiac monitoring  HFpEF Last echo in March of this year with EF 55-60%. BNP 261 on admission. Euvolemic on exam. Does not appear to be contributing to her clinical picture at this time.   Low Back Pain, chronic Follows outpt with ortho, on Celebrex, flexeril, and Norco at home.  - Hold Celebrex and flexeril - Oxy 10mg  q6 PRN - Tylenol 625mg  q6 PRN  Insomnia On trazodone and lunesta at home. Given ramelteon last night with no improvement, likely that cough is exacerbating her sleep difficulties.  - Treat cough as above - May consider adding low-dose Ambien tonight if no improvement  - Continue home trazodone - Hold home lunesta   Hx of Bariatric Surgery  (1992) Hypocalcemia, chronic - Continue home calcitriol 0.36mcg - Continue calcium carbonate   Hypertension On no meds at home. Normotensive today.   Hypothyroidism, stable TSH wnl.  - Continue home Synthroid 156mcg daily   FEN/GI: Heart Healthy Diet PPx: Eliquis 5mg  BID Dispo:Home tomorrow. Barriers include pending thoracentesis and pulmonology recs.   Subjective:  Ms. Witte reports a continued painful, nonproductive cough this morning.  She states that this was unchanged from previous and remains most painful on the left side.  We discussed the possibility of a thoracentesis and she is amenable to discussing this further with IR.  She continues to report diminished appetite.  Objective: Temp:  [98.6 F (37 C)-101.1 F (38.4 C)] 98.7 F (37.1 C) (07/03 0426) Pulse Rate:  [60-75] 63 (07/03 0426) Resp:  [16-22] 17 (07/03 0426) BP: (99-127)/(53-94) 102/67 (07/03 0426) SpO2:  [90 %-100 %] 95 % (07/03 0426) Weight:  [78.9 kg-81 kg] 81 kg (07/03 0426) Physical Exam: General: Appears comfortable in bed with breakfast tray, no apparent distress Cardiovascular: Regular, paced rhythm, II/VI systolic ejection murmur noted at left sternal border Respiratory: Diminished breath sounds noted in the left lower lung field, normal work of breathing on room air Abdomen: Abdomen non-tender Extremities: No LE edema  Laboratory: Recent Labs  Lab 08/21/20 1449 08/22/20 0404  WBC 7.9 7.7  HGB 10.6* 10.5*  HCT 34.5* 32.7*  PLT 224 246   Recent Labs  Lab 08/21/20 1449 08/22/20 0404  NA 137 136  K 4.3 4.4  CL 103 102  CO2 27 28  BUN 23 18  CREATININE 0.88 0.91  CALCIUM 9.0 8.6*  PROT  7.1 6.4*  BILITOT 1.1 0.8  ALKPHOS 85 82  ALT 16 15  AST 19 23  GLUCOSE 68* 104*      Imaging/Diagnostic Tests: No new imaging  Eppie Gibson, MD 08/22/2020, 6:44 AM PGY-1, [place] Intern pager: [phone], text pages welcome

## 2020-08-22 NOTE — Plan of Care (Signed)
  Problem: Pain Managment: Goal: General experience of comfort will improve Outcome: Progressing   Problem: Clinical Measurements: Goal: Respiratory complications will improve Outcome: Progressing   

## 2020-08-22 NOTE — Progress Notes (Signed)
PT Cancellation Note  Patient Details Name: Jillian Eaton MRN: [id number] DOB: 1950-12-22   Cancelled Treatment:    Reason Eval/Treat Not Completed: Patient declined, no reason specified. Pt refusing PT evaluation, reports feeling at her baseline. Per OT note pt performs bed mobility and transfers at a modI level. Pt is educated on the need for continued mobilization during admission. PT also alerts pt to request re-consult of PT if any mobility concerns arise. Acute PT signing off at this time.   Zenaida Niece 08/22/2020, 8:49 AM

## 2020-08-22 NOTE — Consult Note (Signed)
NAME:  Jillian Eaton, MRN:  [id number], DOB:  02/23/1950, LOS: 0 ADMISSION DATE:  08/21/2020, CONSULTATION DATE:  08/22/2020 REFERRING MD:  Dorris Singh MD, CHIEF COMPLAINT: Pneumonia, effusion   History of Present Illness:   70 year old with mild asthma, OSA, a fib on eliquis, CHF, CKD admitted with acute respiratory failure secondary to left lower lobe pneumonia  Patient previously seen in pulmonary clinic by Dr. Halford Chessman with mild asthma.  She is not on any controller inhaler medication, had a hospitalization in [place] for small bowel obstruction.  Underwent small bowel endoscopy on 6/24 by Dr. Benson Norway, GI who was unable to visualize the area of obstruction due to prior gastric bypass.  She has been referred to GI for further evaluation  Pertinent  Medical History    has a past medical history of Anemia, Arthritis, Asthma, Cancer ([place]), CHF (congestive heart failure) ([place]) (6948), Complication of anesthesia, DOE (dyspnea on exertion), Dyspnea, Dysrhythmia, Fibromyalgia, Headache (04/2019), Heart murmur, Hematuria - cause not known, History of COVID-19, History of kidney stones, Hypertension, Pneumonia, Presence of permanent cardiac pacemaker, SBO (small bowel obstruction) ([place]), Thyroid disease, and Transfusion history.   Significant Hospital Events: Including procedures, antibiotic start and stop dates in addition to other pertinent events   7/2-admit  Interim History / Subjective:    Objective   Blood pressure 101/69, pulse (!) 59, temperature 99.7 F (37.6 C), temperature source Oral, resp. rate 20, height 5\' 7"  (1.702 m), weight 81 kg, SpO2 97 %.        Intake/Output Summary (Last 24 hours) at 08/22/2020 1046 Last data filed at 08/22/2020 0900 Gross per 24 hour  Intake 551.33 ml  Output --  Net 551.33 ml   Filed Weights   08/21/20 1557 08/22/20 0426  Weight: 78.9 kg 81 kg    Examination: Gen:      No acute distress HEENT:  EOMI, sclera anicteric Neck:     No masses;  no thyromegaly Lungs:    Left lower lobe crackles, diminished breath sounds CV:         Regular rate and rhythm; no murmurs Abd:      + bowel sounds; soft, non-tender; no palpable masses, no distension Ext:    No edema; adequate peripheral perfusion Skin:      Warm and dry; no rash Neuro: alert and oriented x 3 Psych: normal mood and affect   Resolved Hospital Problem list     Assessment & Plan:  Left lower lobe pneumonia Multiple CT chest reviewed with left lower lobe consolidation, small left parapneumonic effusion On examination by bedside ultrasound the effusion is too small to do a thoracentesis Continue monitoring with follow-up chest x-ray in 2 weeks Continue antibiotics secondary to acquired pneumonia  Left lung ultrasound   Mild asthma Continue nebulizers as needed during hospitalization.  No need for controller medication  Concern for pulmonary hypertension No evidence of pulmonary hypertension on recent cath in March 2022 Continue current management of HFpEF, atrial fibrillation  PCCM will be available as needed. Please call with questions.  Best Practice (right click and "Reselect all SmartList Selections" daily)   Per primary  Labs   CBC: Recent Labs  Lab 08/21/20 1449 08/22/20 0404  WBC 7.9 7.7  NEUTROABS 6.2  --   HGB 10.6* 10.5*  HCT 34.5* 32.7*  MCV 97.5 94.8  PLT 224 546    Basic Metabolic Panel: Recent Labs  Lab 08/21/20 1449 08/22/20 0404  NA 137 136  K 4.3 4.4  CL 103 102  CO2 27 28  GLUCOSE 68* 104*  BUN 23 18  CREATININE 0.88 0.91  CALCIUM 9.0 8.6*   GFR: Estimated Creatinine Clearance: 64.8 mL/min (by C-G formula based on SCr of 0.91 mg/dL). Recent Labs  Lab 08/21/20 1449 08/21/20 1540 08/22/20 0404  WBC 7.9  --  7.7  LATICACIDVEN  --  0.7  --     Liver Function Tests: Recent Labs  Lab 08/21/20 1449 08/22/20 0404  AST 19 23  ALT 16 15  ALKPHOS 85 82  BILITOT 1.1 0.8  PROT 7.1 6.4*  ALBUMIN 3.2* 2.8*   No  results for input(s): LIPASE, AMYLASE in the last 168 hours. No results for input(s): AMMONIA in the last 168 hours.  ABG    Component Value Date/Time   PHART 7.358 04/23/2020 1150   PCO2ART 39.8 04/23/2020 1150   PO2ART 85 04/23/2020 1150   HCO3 23.9 04/23/2020 1201   HCO3 23.7 04/23/2020 1201   TCO2 25 04/23/2020 1201   TCO2 25 04/23/2020 1201   ACIDBASEDEF 2.0 04/23/2020 1201   ACIDBASEDEF 2.0 04/23/2020 1201   O2SAT 62.0 04/23/2020 1201   O2SAT 62.0 04/23/2020 1201     Coagulation Profile: No results for input(s): INR, PROTIME in the last 168 hours.  Cardiac Enzymes: No results for input(s): CKTOTAL, CKMB, CKMBINDEX, TROPONINI in the last 168 hours.  HbA1C: Hgb A1c MFr Bld  Date/Time Value Ref Range Status  04/25/2020 12:49 AM 4.0 (L) 4.8 - 5.6 % Final    Comment:    (NOTE) Pre diabetes:          5.7%-6.4%  Diabetes:              >6.4%  Glycemic control for   <7.0% adults with diabetes   06/16/2019 05:55 AM 5.0 4.8 - 5.6 % Final    Comment:    (NOTE) Pre diabetes:          5.7%-6.4% Diabetes:              >6.4% Glycemic control for   <7.0% adults with diabetes     CBG: No results for input(s): GLUCAP in the last 168 hours.  Review of Systems:    REVIEW OF SYSTEMS:   All negative; except for those that are bolded, which indicate positives.  Constitutional: weight loss, weight gain, night sweats, fevers, chills, fatigue, weakness.  HEENT: headaches, sore throat, sneezing, nasal congestion, post nasal drip, difficulty swallowing, tooth/dental problems, visual complaints, visual changes, ear aches. Neuro: difficulty with speech, weakness, numbness, ataxia. CV:  chest pain, orthopnea, PND, swelling in lower extremities, dizziness, palpitations, syncope.  Resp: cough, hemoptysis, dyspnea, wheezing. GI: heartburn, indigestion, abdominal pain, nausea, vomiting, diarrhea, constipation, change in bowel habits, loss of appetite, hematemesis, melena, hematochezia.   GU: dysuria, change in color of urine, urgency or frequency, flank pain, hematuria. MSK: joint pain or swelling, decreased range of motion. Psych: change in mood or affect, depression, anxiety, suicidal ideations, homicidal ideations. Skin: rash, itching, bruising.   Past Medical History:  She,  has a past medical history of Anemia, Arthritis, Asthma, Cancer ([place]), CHF (congestive heart failure) ([place]) (8185), Complication of anesthesia, DOE (dyspnea on exertion), Dyspnea, Dysrhythmia, Fibromyalgia, Headache (04/2019), Heart murmur, Hematuria - cause not known, History of COVID-19, History of kidney stones, Hypertension, Pneumonia, Presence of permanent cardiac pacemaker, SBO (small bowel obstruction) ([place]), Thyroid disease, and Transfusion history.   Surgical History:   Past Surgical History:  Procedure Laterality Date   ABDOMINAL ADHESION  SURGERY  2004   open LOA - in [place]   AV NODE ABLATION N/A 06/20/2019   Procedure: AV NODE ABLATION;  Surgeon: Evans Lance, MD;  Location: [facility];  Service: Cardiovascular;  Laterality: N/A;   BALLOON DILATION N/A 07/25/2019   Procedure: BALLOON DILATION;  Surgeon: Carol Ada, MD;  Location: [facility];  Service: Endoscopy;  Laterality: N/A;   CARDIAC CATHETERIZATION  04/04/2010   No significant obstructive coronary artery disease   CARDIOVERSION N/A 08/08/2017   Procedure: CARDIOVERSION;  Surgeon: Pixie Casino, MD;  Location: [facility];  Service: Cardiovascular;  Laterality: N/A;   CARDIOVERSION N/A 06/18/2019   Procedure: CARDIOVERSION;  Surgeon: Pixie Casino, MD;  Location: [facility];  Service: Cardiovascular;  Laterality: N/A;   CHOLECYSTECTOMY  1990   COLONOSCOPY W/ POLYPECTOMY     COLONOSCOPY WITH PROPOFOL N/A 04/10/2014   Procedure: COLONOSCOPY WITH PROPOFOL;  Surgeon: Beryle Beams, MD;  Location: [facility];  Service: Endoscopy;  Laterality: N/A;   CYSTOSCOPY/URETEROSCOPY/HOLMIUM LASER/STENT PLACEMENT Right  08/13/2019   Procedure: CYSTOSCOPY/RETROGRADE/URETEROSCOPY/HOLMIUM LASER/STENT PLACEMENT;  Surgeon: Ceasar Mons, MD;  Location: [facility];  Service: Urology;  Laterality: Right;  ONLY NEEDS 45 MIN   ENTEROSCOPY N/A 08/13/2020   Procedure: ENTEROSCOPY;  Surgeon: Carol Ada, MD;  [facility];  Service: Endoscopy;  Laterality: N/A;   ESOPHAGOGASTRODUODENOSCOPY (EGD) WITH PROPOFOL N/A 07/25/2019   Procedure: ESOPHAGOGASTRODUODENOSCOPY (EGD) WITH PROPOFOL;  Surgeon: Carol Ada, MD;  [facility];  Service: Endoscopy;  Laterality: N/A;   EYE SURGERY     lasik 20-25 yrs ago   GASTROPLASTY VERTICAL BANDED  [place]   In [place] for SBO   [place]  07/25/2019   Procedure: MALONEY DILATION;  Surgeon: Carol Ada, MD;  Location: [facility];  Service: Endoscopy;;   PACEMAKER IMPLANT N/A 06/20/2019   Procedure: PACEMAKER IMPLANT;  Surgeon: Evans Lance, MD;  Location: [facility];  Service: Cardiovascular;  Laterality: N/A;   REVERSE SHOULDER ARTHROPLASTY Right 03/29/2018   Procedure: REVERSE SHOULDER ARTHROPLASTY;  Surgeon: Netta Cedars, MD;  Location: [facility];  Service: Orthopedics;  Laterality: Right;   RIGHT/LEFT HEART CATH AND CORONARY ANGIOGRAPHY N/A 04/23/2020   Procedure: RIGHT/LEFT HEART CATH AND CORONARY ANGIOGRAPHY;  Surgeon: Jolaine Artist, MD;  Location: [facility];  Service: Cardiovascular;  Laterality: N/A;   ROUX-EN-Y GASTRIC BYPASS  1992   Conversion VBG to RnYGB in [place], [place]   SHOULDER ARTHROSCOPY WITH SUBACROMIAL DECOMPRESSION  2016   Dr Jannett Celestine, [place]   SMALL INTESTINE SURGERY  2000   SBO - LOA w SB resection   TOTAL KNEE ARTHROPLASTY Left 11/14/2018   Procedure: TOTAL KNEE ARTHROPLASTY;  Surgeon: Paralee Cancel, MD;  Location: [facility];  Service: Orthopedics;  Laterality: Left;  70 mins   TOTAL KNEE ARTHROPLASTY Right 04/29/2019   Procedure: TOTAL KNEE  ARTHROPLASTY;  Surgeon: Paralee Cancel, MD;  Location: [facility];  Service: Orthopedics;  Laterality: Right;  70 mins   TOTAL THYROIDECTOMY  06/26/2012   Dr Celine Ahr, [facility]   TUBAL LIGATION  1986   URETHRAL DILATION  2012   w cystoscopy.  Dr Gaynelle Arabian   UTERINE FIBROID EMBOLIZATION       Social History:   reports that she has never smoked. She has never used smokeless tobacco. She reports that she does not drink alcohol and does not use drugs.   Family History:  Her family history  includes Asthma in her father; Breast cancer in her maternal grandmother, mother, and paternal grandmother; Cancer in her maternal grandmother, mother, and paternal grandmother; Diabetes in her father and mother; Epilepsy in her mother and sister; Heart disease in her father; Hypertension in her father and mother; Kidney disease in her father; Seizures in her mother.   Allergies Allergies  Allergen Reactions   Oxycodone-Acetaminophen Itching, Nausea And Vomiting, Rash and Other (See Comments)          Home Medications  Prior to Admission medications   Medication Sig Start Date End Date Taking? Authorizing Provider  acetaminophen (TYLENOL) 650 MG CR tablet Take 1,300 mg by mouth every 8 (eight) hours as needed for pain.   Yes [provider]  benzonatate (TESSALON) 200 MG capsule Take 200 mg by mouth 3 (three) times daily as needed for cough. 08/20/20  Yes [provider]  calcitRIOL (ROCALTROL) 0.25 MCG capsule Take 1 capsule (0.25 mcg total) by mouth 2 (two) times daily. 05/12/20  Yes Philemon Kingdom, MD  calcium elemental as carbonate (TUMS ULTRA 1000) 400 MG chewable tablet Chew 1,000 mg by mouth 2 (two) times daily.   Yes [provider]  celecoxib (CELEBREX) 200 MG capsule Take 400 mg by mouth every morning. 08/11/20  Yes [provider]  cyanocobalamin (,VITAMIN B-12,) 1000 MCG/ML injection Inject 1 mL (1,000 mcg total) into the muscle every 30 (thirty) days.  04/05/18  Yes Hennie Duos, MD  cyclobenzaprine (FLEXERIL) 10 MG tablet Take 20 mg by mouth 2 (two) times daily as needed for muscle spasms (Back pain).   Yes [provider]  Docusate Sodium (DSS) 100 MG CAPS Take 100 mg by mouth every morning.   Yes [provider]  eszopiclone (LUNESTA) 2 MG TABS tablet Take 1 tablet (2 mg total) by mouth at bedtime. 04/05/18  Yes Hennie Duos, MD  furosemide (LASIX) 20 MG tablet Take 20 mg by mouth daily as needed for edema.   Yes [provider]  HYDROcodone-acetaminophen (NORCO) 10-325 MG tablet Take 1 tablet by mouth 3 (three) times daily as needed (pain). 08/18/20  Yes [provider]  levothyroxine (SYNTHROID) 175 MCG tablet Take 1 tablet (175 mcg total) by mouth daily. Patient taking differently: Take 175 mcg by mouth daily before breakfast. 07/20/20  Yes Philemon Kingdom, MD  Magnesium 400 MG TABS Take 400 mg by mouth every morning.   Yes [provider]  Multiple Vitamins-Minerals (MULTIVITAMIN WITH MINERALS) tablet Take 1 tablet by mouth daily. bariatric   Yes [provider]  Potassium 99 MG TABS Take 99 mg by mouth every morning.   Yes [provider]  traMADol (ULTRAM) 50 MG tablet Take 50 mg by mouth 2 (two) times daily as needed (pain).   Yes [provider]  traZODone (DESYREL) 50 MG tablet Take 50 mg by mouth at bedtime. 06/18/20  Yes [provider]  VITAMIN A PO Take 1 capsule by mouth every morning.   Yes [provider]  Vitamin D, Ergocalciferol, (DRISDOL) 1.25 MG (50000 UT) CAPS capsule Take 1 capsule (50,000 Units total) by mouth every Wednesday. 04/10/18  Yes Hennie Duos, MD     Signature:   Marshell Garfinkel MD [facility] See Amion for pager  If no response to pager , please call [phone] until 7pm After 7:00 pm call Elink  [phone] 08/22/2020, 11:57 AM

## 2020-08-22 NOTE — ED Provider Notes (Signed)
[facility] Provider Note   CSN: [id number] Arrival date & time: 08/21/20  1156     History Chief Complaint  Patient presents with   Shortness of Breath    Jillian Eaton is a 70 y.o. female.  Patient sent in from urgent care.  Patient has a history of congestive heart failure.  On June 24 was admitted by Dr. Carlean Jews for probable small bowel obstruction.  Patient followed by cardiology.  Patient was at urgent care they noticed her oxygen sats were 88% on room air.  Chest x-ray was done which shows left-sided pleural effusion.  Patient was referred into the emergency department based on the hypoxia.  Patient symptoms include shortness of breath mostly with exertion and some mild chest discomfort.  The x-ray done in urgent care did not show any florid pulmonary edema.  Patient's initial temp was afebrile.  The patient went on to develop fever.      Past Medical History:  Diagnosis Date   Anemia    iron and pernicious   Arthritis    Asthma    Cancer ([place])    CHF (congestive heart failure) ([place]) 8270   Complication of anesthesia    BP dropped    DOE (dyspnea on exertion)    2D ECHO, 02/12/2012 - EF 60-65%, moderate concentric hypertrophy   Dyspnea    Dysrhythmia    a-fib   Fibromyalgia    nerve pain"left side at waist level" "can't lay on that side without pain" , "HOB elevation helps"; pt. thinks this has resolved after 200lb weight loss9-17- since i lost the wieght , i dont  think i have this anymore    Headache 04/2019   Heart murmur    Hematuria - cause not known    resolved    History of COVID-19    History of kidney stones    x 2 '13, '14 surgery to remove   Hypertension    Pneumonia    aspiration  04/2016   Presence of permanent cardiac pacemaker    SBO (small bowel obstruction) ([place])    rqueired admission 2020   Thyroid disease    "goiter"   Transfusion history    10 yrs+    Patient Active Problem List   Diagnosis Date Noted    Cardiac arrhythmia 07/02/2020   Loss of appetite 07/02/2020   Personal history of colonic polyps 07/02/2020   Protein-calorie malnutrition, severe 04/26/2020   Chronic kidney disease, stage 3a ([place]) 04/22/2020   Unintentional weight loss 04/22/2020   Hypoglycemia 04/22/2020   Shortness of breath 04/21/2020   Pacemaker 09/24/2019   Iron deficiency anemia 09/15/2019   Fatigue 06/14/2019   S/P right TKA 04/29/2019   Medication management 03/11/2019   Stiffness of left knee 12/12/2018   Normocytic anemia 12/07/2018   Asthma 12/07/2018   S/P left TKA 11/14/2018   Status post total left knee replacement 11/14/2018   Degenerative joint disease involving multiple joints on both sides of body 07/31/2018   Rotator cuff tear arthropathy of right shoulder 04/05/2018   Overactive bladder 04/05/2018   Steroid-induced hyperglycemia    Generalized OA    S/P shoulder replacement, right 03/29/2018   NICM (nonischemic cardiomyopathy) ([place]) 01/15/2018   Osteoporosis 12/13/2017   Rotator cuff tear arthropathy 11/29/2017   Chronic combined systolic and diastolic congestive heart failure ([place]) 09/28/2017   Acute renal failure superimposed on stage 3 chronic kidney disease ([place]) 09/07/2017   Hypomagnesemia with secondary hypocalcemia 09/07/2017  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   Hypercapnemia 07/13/2017   AKI (acute kidney injury) ([place]) 07/10/2017   Vertigo 07/08/2017   Blurring of visual image 07/08/2017   On anticoagulant therapy 06/19/2017   Chronic respiratory failure with hypoxia ([place]) 04/19/2017   GERD (gastroesophageal reflux disease) 08/28/2016   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   Depressive disorder 08/28/2016   Iatrogenic hypocalcemia 08/28/2016   Sepsis (HCC)    Chronic pain syndrome    Acute diastolic (congestive) heart failure (HCC)    Oropharyngeal dysphagia    Atrial fibrillation ([place]) 04/28/2016   Vocal cord dysfunction 04/28/2016   Thrombocytopenia  ([place]) 04/28/2016   Seizure disorder ([place])    Preoperative cardiovascular examination    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   Leg swelling 10/19/2015   Bilateral leg edema 05/11/2015   Essential hypertension 05/11/2015   Nephrolithiasis 04/12/2015   Numbness in both hands 04/12/2015   Complete tear of left rotator cuff 11/19/2014   Primary osteoarthritis of both knees 03/24/2014   Chronic asthmatic bronchitis ([place]) 07/30/2013   Bariatric surgery status 07/30/2013   Urge incontinence of urine 07/30/2013   Vitamin D deficiency 07/30/2013   B-complex deficiency 07/30/2013   Cough 07/30/2013   Gross hematuria 07/30/2013   Palpitations 07/30/2013   Right flank pain 07/30/2013   SBO (small bowel obstruction) ([place]) 06/07/2013   Pulmonary HTN ([place]) 01/08/2013   Insomnia 03/22/2012   Mild intermittent asthma, uncomplicated 13/09/6576   Fibromyalgia 10/26/2011   Anemia of chronic disease 03/28/2011   Obesity (BMI 30-39.9) 09/22/2010    Past Surgical History:  Procedure Laterality Date   ABDOMINAL ADHESION SURGERY  2004   open LOA - in [place]   AV NODE ABLATION N/A 06/20/2019   Procedure: AV NODE ABLATION;  Surgeon: Evans Lance, MD;  Location: [facility];  Service: Cardiovascular;  Laterality: N/A;   BALLOON DILATION N/A 07/25/2019   Procedure: BALLOON DILATION;  Surgeon: Carol Ada, MD;  Location: [facility];  Service: Endoscopy;  Laterality: N/A;   CARDIAC CATHETERIZATION  04/04/2010   No significant obstructive coronary artery disease   CARDIOVERSION N/A 08/08/2017   Procedure: CARDIOVERSION;  Surgeon: Pixie Casino, MD;  Location: [facility];  Service: Cardiovascular;  Laterality: N/A;   CARDIOVERSION N/A 06/18/2019   Procedure: CARDIOVERSION;  Surgeon: Pixie Casino, MD;  Location: [facility];  Service: Cardiovascular;  Laterality: N/A;   CHOLECYSTECTOMY  1990   COLONOSCOPY W/ POLYPECTOMY     COLONOSCOPY WITH PROPOFOL N/A 04/10/2014   Procedure:  COLONOSCOPY WITH PROPOFOL;  Surgeon: Beryle Beams, MD;  [facility];  Service: Endoscopy;  Laterality: N/A;   CYSTOSCOPY/URETEROSCOPY/HOLMIUM LASER/STENT PLACEMENT Right 08/13/2019   Procedure: CYSTOSCOPY/RETROGRADE/URETEROSCOPY/HOLMIUM LASER/STENT PLACEMENT;  Surgeon: Ceasar Mons, MD;  [facility]: [facility];  Service: Urology;  Laterality: Right;  ONLY NEEDS 45 MIN   ENTEROSCOPY N/A 08/13/2020   Procedure: ENTEROSCOPY;  Surgeon: Carol Ada, MD;  [facility];  Service: Endoscopy;  Laterality: N/A;   ESOPHAGOGASTRODUODENOSCOPY (EGD) WITH PROPOFOL N/A 07/25/2019   Procedure: ESOPHAGOGASTRODUODENOSCOPY (EGD) WITH PROPOFOL;  Surgeon: Carol Ada, MD;  [facility];  Service: Endoscopy;  Laterality: N/A;   EYE SURGERY     lasik 20-25 yrs ago   GASTROPLASTY VERTICAL BANDED  [place]   In [place] for SBO   [place]  07/25/2019   Procedure: MALONEY DILATION;  Surgeon: Carol Ada, MD;  [facility];  Service: Endoscopy;;   PACEMAKER IMPLANT N/A  06/20/2019   Procedure: PACEMAKER IMPLANT;  Surgeon: Evans Lance, MD;  Location: [facility];  Service: Cardiovascular;  Laterality: N/A;   REVERSE SHOULDER ARTHROPLASTY Right 03/29/2018   Procedure: REVERSE SHOULDER ARTHROPLASTY;  Surgeon: Netta Cedars, MD;  Location: [facility];  Service: Orthopedics;  Laterality: Right;   RIGHT/LEFT HEART CATH AND CORONARY ANGIOGRAPHY N/A 04/23/2020   Procedure: RIGHT/LEFT HEART CATH AND CORONARY ANGIOGRAPHY;  Surgeon: Jolaine Artist, MD;  Location: [facility];  Service: Cardiovascular;  Laterality: N/A;   ROUX-EN-Y GASTRIC BYPASS  1992   Conversion VBG to RnYGB in [place], [place]   SHOULDER ARTHROSCOPY WITH SUBACROMIAL DECOMPRESSION  2016   Dr Jannett Celestine, [facility]   SMALL INTESTINE SURGERY  2000   SBO - LOA w SB resection   TOTAL KNEE ARTHROPLASTY Left 11/14/2018   Procedure: TOTAL KNEE  ARTHROPLASTY;  Surgeon: Paralee Cancel, MD;  [facility];  Service: Orthopedics;  Laterality: Left;  70 mins   TOTAL KNEE ARTHROPLASTY Right 04/29/2019   Procedure: TOTAL KNEE ARTHROPLASTY;  Surgeon: Paralee Cancel, MD;  [facility];  Service: Orthopedics;  Laterality: Right;  70 mins   TOTAL THYROIDECTOMY  06/26/2012   Dr Celine Ahr, [facility]   TUBAL LIGATION  1986   URETHRAL DILATION  2012   w cystoscopy.  Dr Gaynelle Arabian   UTERINE FIBROID EMBOLIZATION       OB History   No obstetric history on file.     Family History  Problem Relation Age of Onset   Diabetes Mother    Epilepsy Mother    Cancer Mother        Breast   Hypertension Mother    Breast cancer Mother    Seizures Mother    Kidney disease Father    Diabetes Father    Hypertension Father    Asthma Father    Heart disease Father    Epilepsy Sister    Cancer Maternal Grandmother    Breast cancer Maternal Grandmother    Cancer Paternal Grandmother    Breast cancer Paternal Grandmother     Social History   Tobacco Use   Smoking status: Never   Smokeless tobacco: Never  Vaping Use   Vaping Use: Never used  Substance Use Topics   Alcohol use: No    Comment: wine occ   Drug use: No    Types: Oxycodone    Comment: perscribed    Home Medications Prior to Admission medications   Medication Sig Start Date End Date Taking? Authorizing Provider  acetaminophen (TYLENOL) 650 MG CR tablet Take 1,300 mg by mouth every 8 (eight) hours as needed for pain.   Yes [provider]  benzonatate (TESSALON) 200 MG capsule Take 200 mg by mouth 3 (three) times daily as needed for cough. 08/20/20  Yes [provider]  calcitRIOL (ROCALTROL) 0.25 MCG capsule Take 1 capsule (0.25 mcg total) by mouth 2 (two) times daily. 05/12/20  Yes Philemon Kingdom, MD  calcium elemental as carbonate (TUMS ULTRA 1000) 400 MG chewable tablet Chew 1,000 mg by mouth 2 (two) times daily.   Yes [provider]   celecoxib (CELEBREX) 200 MG capsule Take 400 mg by mouth every morning. 08/11/20  Yes [provider]  cyanocobalamin (,VITAMIN B-12,) 1000 MCG/ML injection Inject 1 mL (1,000 mcg total) into the muscle every 30 (thirty) days. 04/05/18  Yes Hennie Duos, MD  cyclobenzaprine (FLEXERIL) 10 MG tablet Take 20 mg by mouth 2 (two) times daily  as needed for muscle spasms (Back pain).   Yes [provider]  Docusate Sodium (DSS) 100 MG CAPS Take 100 mg by mouth every morning.   Yes [provider]  eszopiclone (LUNESTA) 2 MG TABS tablet Take 1 tablet (2 mg total) by mouth at bedtime. 04/05/18  Yes Hennie Duos, MD  furosemide (LASIX) 20 MG tablet Take 20 mg by mouth daily as needed for edema.   Yes [provider]  HYDROcodone-acetaminophen (NORCO) 10-325 MG tablet Take 1 tablet by mouth 3 (three) times daily as needed (pain). 08/18/20  Yes [provider]  levothyroxine (SYNTHROID) 175 MCG tablet Take 1 tablet (175 mcg total) by mouth daily. Patient taking differently: Take 175 mcg by mouth daily before breakfast. 07/20/20  Yes Philemon Kingdom, MD  Magnesium 400 MG TABS Take 400 mg by mouth every morning.   Yes [provider]  Multiple Vitamins-Minerals (MULTIVITAMIN WITH MINERALS) tablet Take 1 tablet by mouth daily. bariatric   Yes [provider]  Potassium 99 MG TABS Take 99 mg by mouth every morning.   Yes [provider]  traMADol (ULTRAM) 50 MG tablet Take 50 mg by mouth 2 (two) times daily as needed (pain).   Yes [provider]  traZODone (DESYREL) 50 MG tablet Take 50 mg by mouth at bedtime. 06/18/20  Yes [provider]  VITAMIN A PO Take 1 capsule by mouth every morning.   Yes [provider]  Vitamin D, Ergocalciferol, (DRISDOL) 1.25 MG (50000 UT) CAPS capsule Take 1 capsule (50,000 Units total) by mouth every Wednesday. 04/10/18  Yes Hennie Duos, MD    Allergies     Oxycodone-acetaminophen  Review of Systems   Review of Systems  Constitutional:  Positive for fever. Negative for chills.  HENT:  Negative for ear pain and sore throat.   Eyes:  Negative for pain and visual disturbance.  Respiratory:  Positive for shortness of breath. Negative for cough.   Cardiovascular:  Positive for chest pain. Negative for palpitations.  Gastrointestinal:  Negative for abdominal pain and vomiting.  Genitourinary:  Negative for dysuria and hematuria.  Musculoskeletal:  Negative for arthralgias and back pain.  Skin:  Negative for color change and rash.  Neurological:  Negative for seizures and syncope.  All other systems reviewed and are negative.  Physical Exam Updated Vital Signs BP 102/67 (BP Location: Left Arm)   Pulse 63   Temp 98.7 F (37.1 C) (Oral)   Resp 17   Ht 1.702 m (5\' 7" )   Wt 81 kg   SpO2 95%   BMI 27.97 kg/m   Physical Exam Vitals and nursing note reviewed.  Constitutional:      General: She is not in acute distress.    Appearance: Normal appearance. She is well-developed.  HENT:     Head: Normocephalic and atraumatic.  Eyes:     Extraocular Movements: Extraocular movements intact.     Conjunctiva/sclera: Conjunctivae normal.     Pupils: Pupils are equal, round, and reactive to light.  Cardiovascular:     Rate and Rhythm: Normal rate and regular rhythm.     Heart sounds: No murmur heard. Pulmonary:     Effort: Pulmonary effort is normal. No respiratory distress.     Breath sounds: Normal breath sounds. No wheezing, rhonchi or rales.     Comments: Decreased breath sounds on the left. Chest:     Chest wall: No tenderness.  Abdominal:     Palpations: Abdomen is  soft.     Tenderness: There is no abdominal tenderness.  Musculoskeletal:        General: No swelling. Normal range of motion.     Cervical back: Normal range of motion and neck supple.  Skin:    General: Skin is warm and dry.     Capillary Refill: Capillary refill  takes less than 2 seconds.  Neurological:     General: No focal deficit present.     Mental Status: She is alert and oriented to person, place, and time.    ED Results / Procedures / Treatments   Labs (all labs ordered are listed, but only abnormal results are displayed) Labs Reviewed  CBC WITH DIFFERENTIAL/PLATELET - Abnormal; Notable for the following components:      Result Value   RBC 3.54 (*)    Hemoglobin 10.6 (*)    HCT 34.5 (*)    Lymphs Abs 0.6 (*)    All other components within normal limits  COMPREHENSIVE METABOLIC PANEL - Abnormal; Notable for the following components:   Glucose, Bld 68 (*)    Albumin 3.2 (*)    All other components within normal limits  BRAIN NATRIURETIC PEPTIDE - Abnormal; Notable for the following components:   B Natriuretic Peptide 261.4 (*)    All other components within normal limits  CBC - Abnormal; Notable for the following components:   RBC 3.45 (*)    Hemoglobin 10.5 (*)    HCT 32.7 (*)    All other components within normal limits  BASIC METABOLIC PANEL - Abnormal; Notable for the following components:   Glucose, Bld 104 (*)    Calcium 8.6 (*)    All other components within normal limits  HEPATIC FUNCTION PANEL - Abnormal; Notable for the following components:   Total Protein 6.4 (*)    Albumin 2.8 (*)    Bilirubin, Direct 0.3 (*)    All other components within normal limits  RESP PANEL BY RT-PCR (FLU A&B, COVID) ARPGX2  MRSA NEXT GEN BY PCR, NASAL  CULTURE, BLOOD (ROUTINE X 2)  CULTURE, BLOOD (ROUTINE X 2)  BODY FLUID CULTURE W GRAM STAIN  URINALYSIS, ROUTINE W REFLEX MICROSCOPIC  LACTIC ACID, PLASMA  LACTATE DEHYDROGENASE  ALBUMIN, PLEURAL OR PERITONEAL FLUID   BODY FLUID CELL COUNT WITH DIFFERENTIAL  LACTATE DEHYDROGENASE, PLEURAL OR PERITONEAL FLUID  PROTEIN, PLEURAL OR PERITONEAL FLUID  CYTOLOGY - NON PAP  TROPONIN I (HIGH SENSITIVITY)    EKG None  Radiology DG Chest 2 View  Result Date: 08/21/2020 CLINICAL DATA:   70 year old female with shortness of breath, left side chest pain for 2 days. EXAM: CHEST - 2 VIEW COMPARISON:  Chest radiographs 04/21/2020 and earlier. FINDINGS: PA and lateral views of the chest today. Stable single lead left chest cardiac pacemaker. Stable cardiomegaly and mediastinal contours. Visualized tracheal air column is within normal limits. The right lung remains clear but there is confluent new left lung base opacity which on the lateral view most resembles pleural effusion. No pulmonary edema or pneumothorax. Right shoulder arthroplasty. No acute osseous abnormality identified. Numerous upper abdominal surgical clips and the visible bowel gas pattern are stable since February. IMPRESSION: 1. Confluent new left lung base opacity since March most resembles pleural effusion on the lateral view. However, confirmation of pleural fluid with CT or Ultrasound is recommended prior to attempted thoracentesis. 2. Otherwise stable cardiomegaly.  No pulmonary edema. Electronically Signed   By: Genevie Ann M.D.   On: 08/21/2020 11:15   CT  Angio Chest PE W/Cm &/Or Wo Cm  Result Date: 08/21/2020 CLINICAL DATA:  Shortness of breath. High probability for pulmonary embolism. EXAM: CT ANGIOGRAPHY CHEST WITH CONTRAST TECHNIQUE: Multidetector CT imaging of the chest was performed using the standard protocol during bolus administration of intravenous contrast. Multiplanar CT image reconstructions and MIPs were obtained to evaluate the vascular anatomy. CONTRAST:  166mL OMNIPAQUE IOHEXOL 350 MG/ML SOLN COMPARISON:  04/10/2020 FINDINGS: Cardiovascular: Satisfactory opacification of pulmonary arteries noted, and no pulmonary emboli identified. No evidence of thoracic aortic aneurysm or mediastinal hematoma. Moderate to severe cardiomegaly is noted, however there is no evidence of pericardial effusion. Mediastinum/Nodes: No masses or pathologically enlarged lymph nodes identified. Lungs/Pleura: A small left pleural effusion is  seen, with left lower lobe volume loss and atelectasis versus infiltrate in the central and medial left lower lobe. Central tracheobronchial airways are patent. Right lung is clear. Upper abdomen: Reflux of contrast into the IVC and hepatic veins is consistent with right heart insufficiency. Musculoskeletal: No suspicious bone lesions identified. Review of the MIP images confirms the above findings. IMPRESSION: No evidence of pulmonary embolism. Small left pleural effusion, with left lower lobe atelectasis versus infiltrate. Moderate to severe cardiomegaly, and findings of right heart insufficiency. Electronically Signed   By: Marlaine Hind M.D.   On: 08/21/2020 17:22    Procedures Procedures   CRITICAL CARE Performed by: Fredia Sorrow Total critical care time: 35 minutes Critical care time was exclusive of separately billable procedures and treating other patients. Critical care was necessary to treat or prevent imminent or life-threatening deterioration. Critical care was time spent personally by me on the following activities: development of treatment plan with patient and/or surrogate as well as nursing, discussions with consultants, evaluation of patient's response to treatment, examination of patient, obtaining history from patient or surrogate, ordering and performing treatments and interventions, ordering and review of laboratory studies, ordering and review of radiographic studies, pulse oximetry and re-evaluation of patient's condition.   Medications Ordered in ED Medications  acetaminophen (TYLENOL) tablet 650 mg (has no administration in time range)    Or  acetaminophen (TYLENOL) suppository 650 mg (has no administration in time range)  traZODone (DESYREL) tablet 50 mg (50 mg Oral Given 08/21/20 2150)  calcitRIOL (ROCALTROL) capsule 0.25 mcg (0.25 mcg Oral Given 08/21/20 2149)  levothyroxine (SYNTHROID) tablet 175 mcg (175 mcg Oral Given 08/22/20 0649)  apixaban (ELIQUIS) tablet 5 mg (5  mg Oral Given 08/21/20 2149)  oxyCODONE (Oxy IR/ROXICODONE) immediate release tablet 10 mg (10 mg Oral Given 08/22/20 0245)  calcium carbonate (TUMS - dosed in mg elemental calcium) chewable tablet 1,000 mg (1,000 mg Oral Given 08/21/20 2149)  benzonatate (TESSALON) capsule 100 mg (100 mg Oral Given 08/21/20 2149)  ramelteon (ROZEREM) tablet 8 mg (8 mg Oral Given 08/21/20 2354)  menthol-cetylpyridinium (CEPACOL) lozenge 3 mg (3 mg Oral Given 08/22/20 0245)  cefTRIAXone (ROCEPHIN) 2 g in sodium chloride 0.9 % 100 mL IVPB (has no administration in time range)  azithromycin (ZITHROMAX) 500 mg in sodium chloride 0.9 % 250 mL IVPB (500 mg Intravenous New Bag/Given 08/22/20 0736)  acetaminophen (TYLENOL) tablet 1,000 mg (1,000 mg Oral Given 08/21/20 1242)  ceFEPIme (MAXIPIME) 2 g in sodium chloride 0.9 % 100 mL IVPB (0 g Intravenous Stopped 08/21/20 1711)  vancomycin (VANCOREADY) IVPB 1500 mg/300 mL (0 mg Intravenous Stopped 08/21/20 1921)  iohexol (OMNIPAQUE) 350 MG/ML injection 100 mL (100 mLs Intravenous Contrast Given 08/21/20 1709)    ED Course  I have reviewed the triage  vital signs and the nursing notes.  Pertinent labs & imaging results that were available during my care of the patient were reviewed by me and considered in my medical decision making (see chart for details).    MDM Rules/Calculators/A&P                          Patient not with septic parameters on vital signs.  But is febrile.  Left pleural effusion questionable infiltrate there.  Patient with admission to the hospital June 24 so technically healthcare acquired pneumonia.  Patient started on protocol antibiotics for that.  Blood culture sent.  Lactic acid sent and it was normal.  Due to the chest discomfort troponins ordered.  First troponin was 7.  Patient's BNP elevated to 61.  But chest x-ray not consistent with pulmonary edema.  Also CT angio chest on to rule out PE ordered to evaluate the pleural effusion better.  Did not show a pulmonary  embolus.  Did showed left-sided pleural effusion with atelectasis versus infiltrate with the fevers and all most likely this is a pneumonia.  Discussed with family medicine as an unassigned admission.  They will admit the patient.  In addition urinalysis was negative.  No leukocytosis white blood cell count was 7.9.  Hemoglobin 10.6.  Liver function tests were normal. Patient's oxygen saturations on 2 L was in the upper 90s.  But has recorded at urgent care with any kind of exertion room air sats went down to 88%.   Final Clinical Impression(s) / ED Diagnoses Final diagnoses:  HCAP (healthcare-associated pneumonia)  Hypoxia  Pleural effusion on left    Rx / DC Orders ED Discharge Orders     None        Fredia Sorrow, MD 08/22/20 [phone]

## 2020-08-22 NOTE — Progress Notes (Signed)
FPTS Interim Progress Note  Received page earlier in the night regarding antibiotics. Family medicine teaching service instructed to continue both ceftriaxone and azithromycin for CAP treatment as were already ordered. Although per chart review, both have been discontinued. Patient needs to continue antibiotic treatment, also discussed with Dr. Owens Shark who agrees with plan from overnight.   Donney Dice, DO 08/22/2020, 7:49 AM PGY-2, [facility] Service pager [phone]

## 2020-08-22 NOTE — Progress Notes (Signed)
Occupational Therapy Evaluation Patient Details Name: Jillian Eaton [facility] MRN: [id number] DOB: 1950-06-03 Today's Date: 08/22/2020    History of Present Illness Pt is a 70 yo female presenting to ED from urgent care on 7/2 with shortness of breath, increased cough and L sided chest pain. Pt found to have L sided pleural effusion. CTA notes left lower lobe atelectasis versus infiltrate. Of note, pt was hospitalized at [facility] on 6/24 for bowel obstruction, leaving AMA. PMH: CHF, pace maker, a fib, pulmonary HTN, COPD, chronic respiratory failure with hypoxia, and asthma.   Clinical Impression   Jillian Eaton was evaluated for the above impairments. PTA pt was indep in all ADL/IADLs and is the primary caregiver for her husband who has dementia. Pt lives in an level entry apartment with elevator access. Upon arrival, pt eating breakfast in bed and complaining of a cough/sore throat. Pt is supervision for all mobility and ADLs at this time for safety. She reports that she feels like she is at her baseline functionally, minus the cough which causes her to be SOB during activity. Pt does not require skilled OT acutely. Recommend d/c home with supervision intermittently for safety.     Follow Up Recommendations  No OT follow up;Supervision - Intermittent    Equipment Recommendations  None recommended by OT       Precautions / Restrictions Precautions Precautions: Fall Restrictions Weight Bearing Restrictions: No      Mobility Bed Mobility Overal bed mobility: Needs Assistance Bed Mobility: Supine to Sit;Sit to Supine     Supine to sit: Modified independent (Device/Increase time) Sit to supine: Modified independent (Device/Increase time)        Transfers Overall transfer level: Needs assistance Equipment used: None Transfers: Sit to/from Stand Sit to Stand: Modified independent (Device/Increase time);From elevated surface         General transfer comment: mod I for incrased time, and  use of rails    Balance Overall balance assessment: Needs assistance Sitting-balance support: No upper extremity supported;Feet supported Sitting balance-Leahy Scale: Good     Standing balance support: No upper extremity supported Standing balance-Leahy Scale: Fair                             ADL either performed or assessed with clinical judgement   ADL Overall ADL's : Needs assistance/impaired Eating/Feeding: Independent;Sitting   Grooming: Wash/dry hands;Wash/dry face;Oral care;Applying deodorant;Supervision/safety;Standing   Upper Body Bathing: Set up;Sitting   Lower Body Bathing: Set up;Sit to/from stand   Upper Body Dressing : Set up;Sitting   Lower Body Dressing: Set up;Sit to/from stand;Supervision/safety   Toilet Transfer: Min guard;Ambulation   Toileting- Clothing Manipulation and Hygiene: Supervision/safety;Sit to/from stand       Functional mobility during ADLs: Supervision/safety General ADL Comments: pt reported she feels like she is at baseliine wtih physcial function, just has laborded breathing and a cough     Vision Baseline Vision/History: No visual deficits Vision Assessment?: No apparent visual deficits            Pertinent Vitals/Pain Pain Assessment: Faces Faces Pain Scale: Hurts a little bit Pain Location: Sore throat from coughing Pain Descriptors / Indicators: Sore Pain Intervention(s): Monitored during session     Hand Dominance     Extremity/Trunk Assessment Upper Extremity Assessment Upper Extremity Assessment: Overall WFL for tasks assessed   Lower Extremity Assessment Lower Extremity Assessment: Defer to PT evaluation   Cervical / Trunk Assessment Cervical / Trunk Assessment: Normal  Communication Communication Communication: No difficulties   Cognition Arousal/Alertness: Awake/alert Behavior During Therapy: WFL for tasks assessed/performed Overall Cognitive Status: Within Functional Limits for tasks  assessed           General Comments: pt anxious due to her husband being at home without her as she is his primary caregiver   General Comments  VSS on RA - pt with cough throguhout session     Home Living Family/patient expects to be discharged to:: Private residence Living Arrangements: Spouse/significant other Available Help at Discharge: Family Type of Home: Apartment Home Access: Level entry;Elevator     Home Layout: One level     Bathroom Shower/Tub: Producer, television/film/video: Standard     Home Equipment: Environmental consultant - 2 wheels;Cane - single point;Shower seat;Wheelchair - manual (All DME in the home is for pt's husband)   Additional Comments: Pt is primary caregiver of her husband      Prior Functioning/Environment Level of Independence: Independent        Comments: 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        OT Problem List: Decreased activity tolerance      OT Treatment/Interventions:      OT Goals(Current goals can be found in the care plan section) Acute Rehab OT Goals Patient Stated Goal: home to husband OT Goal Formulation: With patient   AM-PAC OT "6 Clicks" Daily Activity     Outcome Measure Help from another person eating meals?: None Help from another person taking care of personal grooming?: None Help from another person toileting, which includes using toliet, bedpan, or urinal?: A Little Help from another person bathing (including washing, rinsing, drying)?: A Little Help from another person to put on and taking off regular upper body clothing?: None Help from another person to put on and taking off regular lower body clothing?: A Little 6 Click Score: 21   End of Session Nurse Communication: Mobility status  Activity Tolerance: Patient tolerated treatment well Patient left: in bed;with call bell/phone within reach  OT Visit Diagnosis: Muscle weakness (generalized) (M62.81);Pain                Time: 4696-2952 OT Time  Calculation (min): 8 min Charges:  OT General Charges $OT Visit: 1 Visit OT Evaluation $OT Eval Low Complexity: 1 Low    Darienne Belleau A Talor Desrosiers 08/22/2020, 8:17 AM

## 2020-08-23 LAB — CBC
HCT: 28.1 % — ABNORMAL LOW (ref 36.0–46.0)
Hemoglobin: 8.9 g/dL — ABNORMAL LOW (ref 12.0–15.0)
MCH: 30.1 pg (ref 26.0–34.0)
MCHC: 31.7 g/dL (ref 30.0–36.0)
MCV: 94.9 fL (ref 80.0–100.0)
Platelets: 205 10*3/uL (ref 150–400)
RBC: 2.96 MIL/uL — ABNORMAL LOW (ref 3.87–5.11)
RDW: 14.9 % (ref 11.5–15.5)
WBC: 6.3 10*3/uL (ref 4.0–10.5)
nRBC: 0 % (ref 0.0–0.2)

## 2020-08-23 LAB — BASIC METABOLIC PANEL
Anion gap: 5 (ref 5–15)
BUN: 14 mg/dL (ref 8–23)
CO2: 27 mmol/L (ref 22–32)
Calcium: 8.5 mg/dL — ABNORMAL LOW (ref 8.9–10.3)
Chloride: 103 mmol/L (ref 98–111)
Creatinine, Ser: 0.85 mg/dL (ref 0.44–1.00)
GFR, Estimated: >60 mL/min (ref >60)
Glucose, Bld: 97 mg/dL (ref 70–99)
Potassium: 3.9 mmol/L (ref 3.5–5.1)
Sodium: 135 mmol/L (ref 135–145)

## 2020-08-23 LAB — MAGNESIUM: Magnesium: 1.5 mg/dL — ABNORMAL LOW (ref 1.7–2.4)

## 2020-08-23 MED ORDER — FUROSEMIDE 10 MG/ML IJ SOLN
20.0000 mg | Freq: Once | INTRAMUSCULAR | Status: AC
  Administered 2020-08-23: 20 mg via INTRAVENOUS
  Filled 2020-08-23: qty 2

## 2020-08-23 MED ORDER — DOCUSATE SODIUM 100 MG PO CAPS
100.0000 mg | ORAL_CAPSULE | Freq: Every day | ORAL | Status: DC
  Administered 2020-08-23 – 2020-08-24 (×2): 100 mg via ORAL
  Filled 2020-08-23 (×2): qty 1

## 2020-08-23 MED ORDER — MAGNESIUM SULFATE 2 GM/50ML IV SOLN
2.0000 g | Freq: Once | INTRAVENOUS | Status: AC
  Administered 2020-08-23: 2 g via INTRAVENOUS
  Filled 2020-08-23: qty 50

## 2020-08-23 NOTE — Plan of Care (Signed)

## 2020-08-23 NOTE — Progress Notes (Signed)
Family Medicine Teaching Service Daily Progress Note Intern Pager: [phone]  Patient name: Jillian Eaton [facility] Medical record number: [id number] Date of birth: 1950-12-25 Age: 70 y.o. Gender: female  Primary Care Provider: [name], MD Consultants: Pulmonology Code Status: Full  Pt Overview and Major Events to Date:  Jillian Eaton is a 70 year old female with a history of CHF, A. fib, pacemaker, pulmonary hypertension, COPD presenting with 1 day of shortness of breath, increased cough, and left-sided chest pain  Assessment and Plan:  CAP Acute Hypoxic Respiratory Distress, resolved L Pleural Effusion COPD  Asthma  Pulmonary HTN Seen by Pulm yesterday who declined to perform thoracentesis. Remains afebrile, without white count, and breathing comfortably on room air. However, while ambulating with pulse ox, she rapidly desatted to the 70s. CT Chest yesterday with evidence of left lower lobe pneumonia.  Received one day of cefepime, now day 2 CTX and azithromycin. She is still complaining of intermittent cough.  - Will continue antibiotic treatment - Incentive spirometer - Ambulate with pulse ox daily  - Transition to PO abx tomorrow - Guaifenesin and tessalon for cough  Anemia Hgb 8.9, down from 10.5 yesterday, though this seems consistent with her baseline. - Monitor on AM CBC   Hypomagnesemia  Mag 1.5. - Will give 2g IV mag  - Recheck in AM  Constipation Patient reports no stool since admission. Uses colace at home and is requesting colace here. - Will order colace per patient preference  Insomnia Takes trazodone and lunesta at home. Had been dealing with insomnia in hospital, slept much better last night. 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 Barriers include hypoxia with ambulation.   Subjective:  Ms. Jillian Eaton reports that she slept well last night.  She reports that her cough was improved with the addition of guaifenesin.  Her only complaint at this time is that she has not had a bowel movement since admission.  She takes Colace at home and would like Colace added to her med list..  Objective: Temp:  [98.3 F (36.8 C)-99.7 F (37.6 C)] 98.6 F (37 C) (07/04 0328) Pulse Rate:  [59-63] 63 (07/04 0328) Resp:  [16-20] 20 (07/04 0328) BP: (101-115)/(66-74) 115/74 (07/04 0328) SpO2:  [93 %-97 %] 94 % (07/04 0328) Weight:  [79.5 kg] 79.5 kg (07/04 0331) Physical Exam: General: Resting comfortably in bed, NAD Cardiovascular: Regular rate and rhythm, no m/r/g Respiratory: Lung fields clear to auscultation bilaterally, normal work of breathing Abdomen: Fullness of abdomen, tenderness Extremities: No lower extremity edema noted  Laboratory: Recent Labs  Lab 08/21/20 1449 08/22/20 0404 08/23/20 0242  WBC 7.9 7.7 6.3  HGB 10.6* 10.5* 8.9*  HCT 34.5* 32.7* 28.1*  PLT 224 246 205   Recent Labs  Lab 08/21/20 1449 08/22/20 0404 08/23/20 0242  NA 137 136 135  K 4.3 4.4 3.9  CL 103 102 103  CO2 27 28 27   BUN 23 18 14   CREATININE 0.88 0.91 0.85  CALCIUM 9.0 8.6* 8.5*  PROT 7.1 6.4*  --   BILITOT 1.1 0.8  --   ALKPHOS 85 82  --   ALT 16  15  --   AST 19 23  --   GLUCOSE 68* 104* 97      Imaging/Diagnostic Tests: CT Chest yesterday consistent with left lower lobe pneumonia and small parapneumonic effusion  Eppie Gibson, MD 08/23/2020, 6:30 AM PGY-1, [facility] Intern pager: [phone], text pages welcome

## 2020-08-23 NOTE — Progress Notes (Signed)
O2 before walking 93% RA. During walk Pt 02 sats range from 98 being the highest and the lowest was 72. Arrived back to room and at resting on side of bed 02 sats 94% RA.

## 2020-08-23 NOTE — Progress Notes (Signed)
  Mobility Specialist Criteria Algorithm Info.  SATURATION QUALIFICATIONS: (This note is used to comply with regulatory documentation for home oxygen)  Patient Saturations on Room Air at Rest = 94%  Patient Saturations on Room Air while Ambulating = 94%  Patient Saturations on 0 Liters of oxygen while Ambulating = n/a%  Please briefly explain why patient needs home oxygen:  Mobility Team:  HOB elevated: Activity: Ambulated in hall; Dangled on edge of bed Range of motion: Active; All extremities Level of assistance: Modified independent, requires aide device or extra time Assistive device: None Minutes sitting in chair:  Minutes stood: 10 minutes Minutes ambulated: 10 minutes Distance ambulated (ft): 480 ft Mobility response: Tolerated fair; RN notified Bed Position: Semi-fowlers  Patient initially hesitant to participate in mobility due exhaustion but eventually agreed to participate. Per pt, its been increasingly more difficult to complete iADL's and care for spouse without being overly exerted and SOB. She is independent with ambulation, ADL's, and transfers. Prior to ambulation completed education on energy conservation and pursed lip breathing with receptive feedback. She started ambulating with fast but steady gait. Oxygen quickly desaturated to low 90's requiring cues slow down to saturate better. Ambulated in hallway 480 feet with slow steady gait. Required frequent rest breaks + pursed lip breathing to keep oxygen saturation >90%. Oxygen desaturated to 88% x1 but quickly came up to mid 90's with rest. Tolerated ambulating on room air well without complaint or incident. Upon returning to room completed education on incentive spirometer. Patient was left dangling EOB with all needs met and RN present.  08/23/2020 11:29 AM

## 2020-08-24 ENCOUNTER — Other Ambulatory Visit (HOSPITAL_COMMUNITY): Payer: Self-pay

## 2020-08-24 ENCOUNTER — Encounter: Payer: Self-pay | Admitting: Oncology

## 2020-08-24 LAB — CBC
HCT: 32.9 % — ABNORMAL LOW (ref 36.0–46.0)
Hemoglobin: 10.4 g/dL — ABNORMAL LOW (ref 12.0–15.0)
MCH: 29.9 pg (ref 26.0–34.0)
MCHC: 31.6 g/dL (ref 30.0–36.0)
MCV: 94.5 fL (ref 80.0–100.0)
Platelets: 226 10*3/uL (ref 150–400)
RBC: 3.48 MIL/uL — ABNORMAL LOW (ref 3.87–5.11)
RDW: 14.6 % (ref 11.5–15.5)
WBC: 5.9 10*3/uL (ref 4.0–10.5)
nRBC: 0 % (ref 0.0–0.2)

## 2020-08-24 LAB — BASIC METABOLIC PANEL
Anion gap: 10 (ref 5–15)
BUN: 15 mg/dL (ref 8–23)
CO2: 28 mmol/L (ref 22–32)
Calcium: 8.7 mg/dL — ABNORMAL LOW (ref 8.9–10.3)
Chloride: 102 mmol/L (ref 98–111)
Creatinine, Ser: 0.8 mg/dL (ref 0.44–1.00)
GFR, Estimated: >60 mL/min (ref >60)
Glucose, Bld: 92 mg/dL (ref 70–99)
Potassium: 3.9 mmol/L (ref 3.5–5.1)
Sodium: 140 mmol/L (ref 135–145)

## 2020-08-24 LAB — MAGNESIUM: Magnesium: 1.7 mg/dL (ref 1.7–2.4)

## 2020-08-24 LAB — FERRITIN: Ferritin: 61 ng/mL (ref 11–307)

## 2020-08-24 MED ORDER — CEFDINIR 300 MG PO CAPS
300.0000 mg | ORAL_CAPSULE | Freq: Two times a day (BID) | ORAL | 0 refills | Status: AC
  Filled 2020-08-24: qty 6, 3d supply, fill #0

## 2020-08-24 MED ORDER — GUAIFENESIN 200 MG PO TABS: 200.0000 mg | ORAL_TABLET | ORAL | 0 refills | Status: DC | PRN

## 2020-08-24 MED ORDER — APIXABAN 5 MG PO TABS: 5.0000 mg | ORAL_TABLET | Freq: Two times a day (BID) | ORAL | Status: DC

## 2020-08-24 MED ORDER — AZITHROMYCIN 500 MG PO TABS
500.0000 mg | ORAL_TABLET | Freq: Every day | ORAL | 0 refills | Status: AC
  Filled 2020-08-24: qty 1, 1d supply, fill #0

## 2020-08-24 MED ORDER — BENZONATATE 100 MG PO CAPS
100.0000 mg | ORAL_CAPSULE | Freq: Two times a day (BID) | ORAL | 0 refills | Status: DC | PRN
  Filled 2020-08-24: qty 20, 10d supply, fill #0

## 2020-08-24 MED ORDER — GUAIFENESIN 200 MG PO TABS
200.0000 mg | ORAL_TABLET | ORAL | 0 refills | Status: DC | PRN
  Filled 2020-08-24: qty 30, 5d supply, fill #0

## 2020-08-24 MED ORDER — APIXABAN 5 MG PO TABS
5.0000 mg | ORAL_TABLET | Freq: Two times a day (BID) | ORAL | 0 refills | Status: AC
  Filled 2020-08-24: qty 60, 30d supply, fill #0

## 2020-08-24 NOTE — Progress Notes (Signed)
  Mobility Specialist Criteria Algorithm Info.  SATURATION QUALIFICATIONS: (This note is used to comply with regulatory documentation for home oxygen)  Patient Saturations on Room Air at Rest = 94%  Patient Saturations on Room Air while Ambulating = 95%  Patient Saturations on 0 Liters of oxygen while Ambulating = n/a%  Please briefly explain why patient needs home oxygen:  Mobility Team: HOB elevated: Activity: Ambulated in hall; Dangled on edge of bed Range of motion: Active; All extremities Level of assistance: Independent Assistive device: None Minutes sitting in chair:  Minutes stood: 6 minutes Minutes ambulated: 6 minutes Distance ambulated (ft): 480 ft Mobility response: Tolerated well Bed Position: Semi-fowlers  Patient eager to participate in mobility. Reported feeling and breathing better today. Ambulated in hallway 480 feet independently with steady gait. O2 sat better on room air today saturating 93-96% throughout. Required 2 standing rest breaks to conserve energy. Tolerated ambulation well without incident or complaint and was left dangling EOB with all needs met.  08/24/2020 12:31 PM

## 2020-08-24 NOTE — Hospital Course (Addendum)
Ms. Peaden is a 70 year old female with a history of CHF who presented to the hospital on 7/2 with shortness of breath, cough, chest pain, and fever with a new oxygen requirement.    Antibiotic overview cefepime 2 g x 1 day (7/2), vancomycin 1500 mg IV x1 day (7/2) azithromycin 500 mg IV x3 days (7/3-5), ceftriaxone 2 g IV x3 days (7/3-5)  Acute hypoxic respiratory distress  Left pleural effusion  CAP Patient presented to urgent care with fever, cough and dyspnea, was sent to ED after found to be hypoxic.  ED found her SPO2 to be 88% on room air (improved to 100% with 3 L O2), decreased LLL breath sounds, and CXR suggestive of pleural effusion.  ED CBC, lactic acid normal.  On admission 7/2, patient initially received cefepime and vancomycin.  CTA chest conducted for PE rule out, negative for PE.  Troponin negative.  Pulm consulted, declined paracentesis due to small effusion size. Pulmonology felt that the effusion was too small to sample and expected that it was parapneumonic in etiology and would be self resolving with treatment of the pneumonia.  Patient transitioned to azithromycin and ceftriaxone on hospital day #2.  Ms. Devers's fever and white count resolved quickly and she required oxygen for less than 24 hours. On day 3 of hospitalization she was still hypoxic with activity and was given a single dose of Lasix which greatly improved her symptoms.  She was discharged at her baseline.  Discharged with azithromycin 500 mg daily x1 day and cefdinir 300 mg twice daily x3 days to complete antibiotic courses for CAP.  Items for PCP Follow-up: Patient discharged on azithromycin 500 mg daily x1 day and cefdinir 300 mg twice daily x3 days to complete antibiotic courses for CAP Discharged on Lasix 20 mg p.o. daily as needed for edema; follow-up with PCP and address diuretic needs Recommend against sedating medications; she is currently taking Lunesta and trazodone nightly for sleep, consider that these  may be contraindicated as she ages Recommend review medication list at follow up Discontinued Celebrex on discharge; recommend against Celebrex as patient is on Eliquis Patient at baseline hemoglobin around 10 during admission.  Recommend recheck with PCP.

## 2020-08-24 NOTE — Progress Notes (Signed)
   08/24/20 1018  Mobility  Activity Refused mobility (Asked to return in an hour. Will check back)

## 2020-08-24 NOTE — Progress Notes (Signed)
FPTS Brief Progress Note  S:Pt sleeping in bed, no events tonight as of yet. Breathing comfortably on room air.    O: BP 108/70   Pulse 60   Temp 98.9 F (37.2 C) (Oral)   Resp 16   Ht 5\' 7"  (1.702 m)   Wt 79.5 kg   SpO2 93%   BMI 27.45 kg/m   General: pt sleeping in bed, NAD Resp: no work of breathing  A/P: 70 yo female with acute respiratory failure suspected to be due to CAP.  -Continue with plan as outlined in day teams progress note.  - Orders reviewed. Labs for AM ordered, which was adjusted as needed.    Precious Gilding, DO 08/24/2020, 12:53 AM PGY-1, [place] [facility] Night Resident  Please page [phone] with questions.

## 2020-08-24 NOTE — TOC Benefit Eligibility Note (Signed)
Transition of Care (TOC) Benefit Eligibility Note    Patient Details  Name: Jillian Eaton MRN: [id number] Date of Birth: 01/20/1951   Medication/Dose: ELIQUIS  5 MG BID    NEXT REFILL 09-01-20  Covered?: Yes  Tier: 2 Drug  Prescription Coverage [facility]  Spoke with Person/Company/Phone Number:: COLLIN  @ [facility] [employer] # [phone] OPT-MEMBER  Co-Pay: $50.00  Prior Approval: No  Deductible: Met (OUT-OF-POCKET:UNMET)  Additional Notes: APIXABAN : NON-FORMULARY    ,  Phone Number: 08/24/2020, 5:21 PM     

## 2020-08-24 NOTE — Discharge Instructions (Addendum)
Dear Jillian Eaton,  Thank you for letting us participate in your care. You were hospitalized for shortness of breath and cough and diagnosed with pneumonia. You were treated with IV antibiotics and a single dose of Lasix to get fluid off.   POST-HOSPITAL & CARE INSTRUCTIONS Please finish your antibiotics as prescribed. You have a prescription for "as needed" Lasix, if you feel like you are carrying excess fluid or you notice that you gain more than 3 pounds in 1 day or 5 pounds in a week please take 1 pill and call your doctor After having pneumonia your cough may persist for days.  If you still have a cough you can take the Tessalon and Mucinex cough medicines that worked well for you while in the hospital Go to your follow up appointments (listed below)   DOCTOR'S APPOINTMENT   Future Appointments  Date Time Provider [place]  09/03/2020 10:20 AM Philemon Kingdom, MD [facility] None  09/08/2020  8:45 AM Bo Merino, MD [facility] None  09/17/2020  7:30 AM [facility] DEVICE REMOTES [facility]  10/04/2020  1:15 PM Marzetta Board, DPM [facility]  10/06/2020  9:15 AM Bo Merino, MD [facility] None  10/14/2020  2:45 PM Evans Lance, MD [facility]  12/07/2020  8:00 AM [facility] None  12/09/2020  9:00 AM Magrinat, Virgie Dad, MD [facility] None  01/07/2021  1:45 PM Marzetta Board, DPM [facility]  04/08/2021  1:45 PM Marzetta Board, DPM [facility]     Take care and be well!  [facility]  [place], [place] [id number] [phone]

## 2020-08-24 NOTE — Discharge Summary (Addendum)
[facility] Discharge Summary  Patient name: Jillian Eaton [facility] Medical record number: [id number] Date of birth: 03/24/1950 Age: 70 y.o. Gender: female Date of Admission: 08/21/2020  Date of Discharge: 08/24/2020 Admitting Physician: Martyn Malay, MD  Primary Care Provider: [name], MD Consultants: None   Indication for Hospitalization: Dyspnea and cough  Discharge Diagnoses/Problem List:  Principal Problem:   Sepsis [facility]) Active Problems:   Anemia of chronic disease   Essential hypertension   Atrial fibrillation (HCC)   Chronic pain syndrome   NICM (nonischemic cardiomyopathy) ([place])   Shortness of breath   HCAP (healthcare-associated pneumonia)   Hypoxia   Pleural effusion on left   Disposition: Home  Discharge Condition: Stable, at baseline  Discharge Exam:  BP 108/70   Pulse 60   Temp 98.9 F (37.2 C) (Oral)   Resp 16   Ht 5\' 7"  (4.801 m)   Wt 79.5 kg   SpO2 93%   BMI 27.45 kg/m General: Appears comfortable, sitting up in bed eating breakfast Cardio: Irregularly irregular, rate controlled and with no aberrent heart sounds Pulm: No wheezes crackles or rhonchi, mildly diminished lung sounds in left lower lung field.  Normal work of breathing on room air Abdomen: Soft, nontender, nondistended  Brief Hospital Course:  Ms. Hanser is a 70 year old female with a history of CHF who presented to the hospital on 7/2 with shortness of breath, cough, chest pain, and fever with a new oxygen requirement.    Antibiotic overview cefepime 2 g x 1 day (7/2), vancomycin 1500 mg IV x1 day (7/2) azithromycin 500 mg IV x3 days (7/3-5), ceftriaxone 2 g IV x3 days (7/3-5)  Acute hypoxic respiratory distress  Left pleural effusion  CAP Patient presented to urgent care with fever, cough and dyspnea, was sent to ED after found to be hypoxic.  ED found her SPO2 to be 88% on room air (improved to 100% with 3 L O2), decreased LLL breath sounds,  and CXR suggestive of pleural effusion.  ED CBC, lactic acid normal.  On admission 7/2, patient initially received cefepime and vancomycin.  CTA chest conducted for PE rule out, negative for PE.  Troponin negative.  Pulm consulted, declined paracentesis due to small effusion size. Pulmonology felt that the effusion was too small to sample and expected that it was parapneumonic in etiology and would be self resolving with treatment of the pneumonia.  Patient transitioned to azithromycin and ceftriaxone on hospital day #2.  Ms. Wallick's fever and white count resolved quickly and she required oxygen for less than 24 hours. On day 3 of hospitalization she was still hypoxic with activity and was given a single dose of Lasix which greatly improved her symptoms.  She was discharged at her baseline.  Discharged with azithromycin 500 mg daily x1 day and cefdinir 300 mg twice daily x3 days to complete antibiotic courses for CAP.  Items for PCP Follow-up: Patient discharged on azithromycin 500 mg daily x1 day and cefdinir 300 mg twice daily x3 days to complete antibiotic courses for CAP Discharged on Lasix 20 mg p.o. daily as needed for edema; follow-up with PCP and address diuretic needs Recommend against sedating medications; she is currently taking Lunesta and trazodone nightly for sleep, consider that these may be contraindicated as she ages Recommend review medication list at follow up Discontinued Celebrex on discharge; recommend against Celebrex as patient is on Eliquis Patient at baseline hemoglobin around 10 during admission.  Recommend recheck with PCP.  Significant  Procedures: None  Significant Labs and Imaging:  Recent Labs  Lab 08/22/20 0404 08/23/20 0242 08/24/20 0303  WBC 7.7 6.3 5.9  HGB 10.5* 8.9* 10.4*  HCT 32.7* 28.1* 32.9*  PLT 246 205 226   Recent Labs  Lab 08/21/20 1449 08/22/20 0404 08/23/20 0242 08/24/20 0303  NA 137 136 135 140  K 4.3 4.4 3.9 3.9  CL 103 102 103 102  CO2  27 28 27 28   GLUCOSE 68* 104* 97 92  BUN 23 18 14 15   CREATININE 0.88 0.91 0.85 0.80  CALCIUM 9.0 8.6* 8.5* 8.7*  MG  --   --  1.5* 1.7  ALKPHOS 85 82  --   --   AST 19 23  --   --   ALT 16 15  --   --   ALBUMIN 3.2* 2.8*  --   --        CHEST - 2 VIEW 08/21/2020  COMPARISON:  Chest radiographs 04/21/2020 and earlier. FINDINGS: PA and lateral views of the chest today. Stable single lead left chest cardiac pacemaker. Stable cardiomegaly and mediastinal contours. Visualized tracheal air column is within normal limits. The right lung remains clear but there is confluent new left lung base opacity which on the lateral view most resembles pleural effusion. No pulmonary edema or pneumothorax. Right shoulder arthroplasty. No acute osseous abnormality identified. Numerous upper abdominal surgical clips and the visible bowel gas pattern are stable since February. IMPRESSION: 1. Confluent new left lung base opacity since March most resembles pleural effusion on the lateral view. However, confirmation of pleural fluid with CT or Ultrasound is recommended prior to attempted thoracentesis. 2. Otherwise stable cardiomegaly.  No pulmonary edema.  CT ANGIOGRAPHY CHEST WITH CONTRAST 08/21/2020 IMPRESSION: No evidence of pulmonary embolism. Small left pleural effusion, with left lower lobe atelectasis versus infiltrate. Moderate to severe cardiomegaly, and findings of right heart insufficiency.  CT CHEST WITHOUT CONTRAST 08/22/20 IMPRESSION: 1. Imaging findings are most consistent with left lower lobe pneumonia with small likely parapneumonic effusion. 2. Enlarged main pulmonary artery as can be seen with pulmonary arterial hypertension. 3. Cardiomegaly with marked right atrial enlargement.  Results/Tests Pending at Time of Discharge: none   Discharge Medications:  Allergies as of 08/24/2020       Reactions   Oxycodone-acetaminophen Itching, Nausea And Vomiting, Rash, Other (See  Comments)           Medication List     STOP taking these medications    celecoxib 200 MG capsule Commonly known as: CELEBREX   cyclobenzaprine 10 MG tablet Commonly known as: FLEXERIL       TAKE these medications    acetaminophen 650 MG CR tablet Commonly known as: TYLENOL Take 1,300 mg by mouth every 8 (eight) hours as needed for pain.   apixaban 5 MG Tabs tablet Commonly known as: ELIQUIS Take 1 tablet (5 mg total) by mouth 2 (two) times daily.   azithromycin 500 MG tablet Commonly known as: Zithromax Take 1 tablet (500 mg total) by mouth daily for 1 day   benzonatate 100 MG capsule Commonly known as: TESSALON Take 1 capsule (100 mg total) by mouth 2 (two) times daily as needed for cough. What changed:  medication strength how much to take when to take this   calcitRIOL 0.25 MCG capsule Commonly known as: ROCALTROL Take 1 capsule (0.25 mcg total) by mouth 2 (two) times daily.   cefdinir 300 MG capsule Commonly known as: OMNICEF Take 1 capsule (300 mg  total) by mouth 2 (two) times daily for 3 days.   cyanocobalamin 1000 MCG/ML injection Commonly known as: (VITAMIN B-12) Inject 1 mL (1,000 mcg total) into the muscle every 30 (thirty) days.   DSS 100 MG Caps Take 100 mg by mouth every morning.   eszopiclone 2 MG Tabs tablet Commonly known as: LUNESTA Take 1 tablet (2 mg total) by mouth at bedtime.   furosemide 20 MG tablet Commonly known as: LASIX Take 20 mg by mouth daily as needed for edema.   guaiFENesin 200 MG tablet Take 1 tablet (200 mg total) by mouth every 4 (four) hours as needed for cough or to loosen phlegm.   HYDROcodone-acetaminophen 10-325 MG tablet Commonly known as: NORCO Take 1 tablet by mouth 3 (three) times daily as needed (pain).   levothyroxine 175 MCG tablet Commonly known as: SYNTHROID Take 1 tablet (175 mcg total) by mouth daily. What changed: when to take this   Magnesium 400 MG Tabs Take 400 mg by mouth every  morning.   multivitamin with minerals tablet Take 1 tablet by mouth daily. bariatric   Potassium 99 MG Tabs Take 99 mg by mouth every morning.   traMADol 50 MG tablet Commonly known as: ULTRAM Take 50 mg by mouth 2 (two) times daily as needed (pain).   traZODone 50 MG tablet Commonly known as: DESYREL Take 50 mg by mouth at bedtime.   Tums Ultra 1000 400 MG chewable tablet Generic drug: calcium elemental as carbonate Chew 1,000 mg by mouth 2 (two) times daily.   VITAMIN A PO Take 1 capsule by mouth every morning.   Vitamin D (Ergocalciferol) 1.25 MG (50000 UNIT) Caps capsule Commonly known as: DRISDOL Take 1 capsule (50,000 Units total) by mouth every Wednesday.        Discharge Instructions: Please refer to Patient Instructions section of EMR for full details.  Patient was counseled important signs and symptoms that should prompt return to medical care, changes in medications, dietary instructions, activity restrictions, and follow up appointments.   Follow-Up Appointments:  Follow-up Information     [name], MD. Go on 09/01/2020.   Specialty: Internal Medicine Why: @11 :15am Contact information: [place] [address] [phone]         Pixie Casino, MD .   Specialty: Cardiology Contact information: [place] [address] [phone]         Evans Lance, MD .   Specialty: Cardiology Contact information: [address] [address] [phone]                 Eppie Gibson, MD 08/24/2020, 2:19 PM PGY-1, [facility]  Upper Level Addendum: I have seen and evaluated this patient along with Dr. Owens Shark and reviewed the above note, making necessary revisions as appropriate. These are denoted by green text. I agree with the medical decision making and physical exam as noted above. Ezequiel Essex, MD PGY-2 [facility]

## 2020-08-25 ENCOUNTER — Other Ambulatory Visit: Payer: Medicare Other

## 2020-08-25 DIAGNOSIS — R0902 Hypoxemia: Secondary | ICD-10-CM

## 2020-08-25 DIAGNOSIS — J189 Pneumonia, unspecified organism: Secondary | ICD-10-CM

## 2020-08-25 DIAGNOSIS — J90 Pleural effusion, not elsewhere classified: Secondary | ICD-10-CM

## 2020-08-26 LAB — CULTURE, BLOOD (ROUTINE X 2)
Culture: NO GROWTH
Culture: NO GROWTH
Special Requests: ADEQUATE
Special Requests: ADEQUATE

## 2020-08-26 NOTE — Progress Notes (Deleted)
Office Visit Note  Patient: Jillian Eaton             Date of Birth: 10-24-1950           MRN: [id number]             PCP: [name], MD Referring: Chauncey Cruel, MD Visit Date: 09/08/2020 Occupation: @GUAROCC @  Subjective:  No chief complaint on file.   History of Present Illness: Jillian Eaton is a 70 y.o. female ***   Activities of Daily Living:  Patient reports morning stiffness for *** {minute/hour:19697}.   Patient {ACTIONS;DENIES/REPORTS:21021675::"Denies"} nocturnal pain.  Difficulty dressing/grooming: {ACTIONS;DENIES/REPORTS:21021675::"Denies"} Difficulty climbing stairs: {ACTIONS;DENIES/REPORTS:21021675::"Denies"} Difficulty getting out of chair: {ACTIONS;DENIES/REPORTS:21021675::"Denies"} Difficulty using hands for taps, buttons, cutlery, and/or writing: {ACTIONS;DENIES/REPORTS:21021675::"Denies"}  No Rheumatology ROS completed.   PMFS History:  Patient Active Problem List   Diagnosis Date Noted  . HCAP (healthcare-associated pneumonia)   . Hypoxia   . Pleural effusion on left   . Cardiac arrhythmia 07/02/2020  . Loss of appetite 07/02/2020  . Personal history of colonic polyps 07/02/2020  . Protein-calorie malnutrition, severe 04/26/2020  . Chronic kidney disease, stage 3a ([place]) 04/22/2020  . Unintentional weight loss 04/22/2020  . Hypoglycemia 04/22/2020  . Shortness of breath 04/21/2020  . Pacemaker 09/24/2019  . Iron deficiency anemia 09/15/2019  . Fatigue 06/14/2019  . S/P right TKA 04/29/2019  . Medication management 03/11/2019  . Stiffness of left knee 12/12/2018  . Normocytic anemia 12/07/2018  . Asthma 12/07/2018  . S/P left TKA 11/14/2018  . Status post total left knee replacement 11/14/2018  . Degenerative joint disease involving multiple joints on both sides of body 07/31/2018  . Rotator cuff tear arthropathy of right shoulder 04/05/2018  . Overactive bladder 04/05/2018  . Steroid-induced hyperglycemia    . Generalized OA   . 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 NICM (nonischemic cardiomyopathy) ([place]) 01/15/2018  . Osteoporosis 12/13/2017  . Rotator cuff tear arthropathy 11/29/2017  . Chronic combined systolic and diastolic congestive heart failure ([place]) 09/28/2017  . Acute renal failure superimposed on stage 3 chronic kidney disease ([place]) 09/07/2017  . Hypomagnesemia with secondary hypocalcemia 09/07/2017  . 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 Hypercapnemia 07/13/2017  . AKI (acute kidney injury) ([place]) 07/10/2017  . Vertigo 07/08/2017  . Blurring of visual image 07/08/2017  . On anticoagulant therapy 06/19/2017  . Chronic respiratory failure with hypoxia ([place]) 04/19/2017  . GERD (gastroesophageal reflux disease) 08/28/2016  . 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 Depressive disorder 08/28/2016  . Iatrogenic hypocalcemia 08/28/2016  . Sepsis ([place])   . Chronic pain syndrome   . Acute diastolic (congestive) heart failure ([place])   . Oropharyngeal dysphagia   . Atrial fibrillation ([place]) 04/28/2016  . Vocal cord dysfunction 04/28/2016  . Thrombocytopenia ([place]) 04/28/2016  . Seizure disorder ([place])   . Preoperative cardiovascular examination   . 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  . Leg swelling 10/19/2015  . Bilateral leg edema 05/11/2015  . Essential hypertension 05/11/2015  . Nephrolithiasis 04/12/2015  . Numbness in both hands 04/12/2015  . Complete tear of left rotator cuff 11/19/2014  . Primary osteoarthritis of both knees 03/24/2014  . Chronic asthmatic bronchitis ([place]) 07/30/2013  . Bariatric surgery status 07/30/2013  . Urge incontinence of urine 07/30/2013  . Vitamin D deficiency 07/30/2013  . B-complex deficiency 07/30/2013  . Cough 07/30/2013  . Gross hematuria 07/30/2013  . Palpitations 07/30/2013  . Right flank pain 07/30/2013  . SBO (small bowel obstruction) (  [place]) 06/07/2013  . Pulmonary HTN ([place]) 01/08/2013  . Insomnia  03/22/2012  . Mild intermittent asthma, uncomplicated 40/98/1191  . Fibromyalgia 10/26/2011  . Anemia of chronic disease 03/28/2011  . Obesity (BMI 30-39.9) 09/22/2010    Past Medical History:  Diagnosis Date  . Anemia    iron and pernicious  . Arthritis   . Asthma   . Cancer ([place])   . CHF (congestive heart failure) ([place]) 2018  . Complication of anesthesia    BP dropped   . DOE (dyspnea on exertion)    2D ECHO, 02/12/2012 - EF 60-65%, moderate concentric hypertrophy  . Dyspnea   . Dysrhythmia    a-fib  . Fibromyalgia    nerve pain"left side at waist level" "can't lay on that side without pain" , "HOB elevation helps"; pt. thinks this has resolved after 200lb weight loss9-17- since i lost the wieght , i dont  think i have this anymore   . Headache 04/2019  . Heart murmur   . Hematuria - cause not known    resolved   . History of COVID-19   . History of kidney stones    x 2 '13, '14 surgery to remove  . Hypertension   . Pneumonia    aspiration  04/2016  . Presence of permanent cardiac pacemaker   . SBO (small bowel obstruction) ([place])    rqueired admission 2020  . Thyroid disease    "goiter"  . Transfusion history    10 yrs+    Family History  Problem Relation Age of Onset  . Diabetes Mother   . Epilepsy Mother   . Cancer Mother        Breast  . Hypertension Mother   . Breast cancer Mother   . Seizures Mother   . Kidney disease Father   . Diabetes Father   . Hypertension Father   . Asthma Father   . Heart disease Father   . Epilepsy Sister   . Cancer Maternal Grandmother   . Breast cancer Maternal Grandmother   . Cancer Paternal Grandmother   . Breast cancer Paternal Grandmother    Past Surgical History:  Procedure Laterality Date  . ABDOMINAL ADHESION SURGERY  2004   open LOA - in [place]  . AV NODE ABLATION N/A 06/20/2019   Procedure: AV NODE ABLATION;  Surgeon: Evans Lance, MD;  Location: [facility];  Service: Cardiovascular;  Laterality: N/A;   . BALLOON DILATION N/A 07/25/2019   Procedure: BALLOON DILATION;  Surgeon: Carol Ada, MD;  Location: [facility];  Service: Endoscopy;  Laterality: N/A;  . CARDIAC CATHETERIZATION  04/04/2010   No significant obstructive coronary artery disease  . CARDIOVERSION N/A 08/08/2017   Procedure: CARDIOVERSION;  Surgeon: Pixie Casino, MD;  Location[facility];  Service: Cardiovascular;  Laterality: N/A;  . CARDIOVERSION N/A 06/18/2019   Procedure: CARDIOVERSION;  Surgeon: Pixie Casino, MD;  Location: [facility];  Service: Cardiovascular;  Laterality: N/A;  . CHOLECYSTECTOMY  1990  . COLONOSCOPY W/ POLYPECTOMY    . COLONOSCOPY WITH PROPOFOL N/A 04/10/2014   Procedure: COLONOSCOPY WITH PROPOFOL;  Surgeon: Beryle Beams, MD;  Location: [facility];  Service: Endoscopy;  Laterality: N/A;  . CYSTOSCOPY/URETEROSCOPY/HOLMIUM LASER/STENT PLACEMENT Right 08/13/2019   Procedure: CYSTOSCOPY/RETROGRADE/URETEROSCOPY/HOLMIUM LASER/STENT PLACEMENT;  Surgeon: Ceasar Mons, MD;  Location: [facility];  Service: Urology;  Laterality: Right;  ONLY NEEDS 45 MIN  . ENTEROSCOPY N/A 08/13/2020   Procedure: ENTEROSCOPY;  Surgeon: Carol Ada, MD;  Location:  [facility];  Service: Endoscopy;  Laterality: N/A;  . ESOPHAGOGASTRODUODENOSCOPY (EGD) WITH PROPOFOL N/A 07/25/2019   Procedure: ESOPHAGOGASTRODUODENOSCOPY (EGD) WITH PROPOFOL;  Surgeon: Carol Ada, MD;  [facility];  Service: Endoscopy;  Laterality: N/A;  . EYE SURGERY     lasik 20-25 yrs ago  . GASTROPLASTY VERTICAL BANDED  1985  . [place]   In [place] for SBO  . MALONEY DILATION  07/25/2019   Procedure: Venia Minks DILATION;  Surgeon: Carol Ada, MD;  Location: [facility];  Service: Endoscopy;;  . PACEMAKER IMPLANT N/A 06/20/2019   Procedure: PACEMAKER IMPLANT;  Surgeon: Evans Lance, MD;  Location: [facility];  Service: Cardiovascular;  Laterality: N/A;  . REVERSE SHOULDER  ARTHROPLASTY Right 03/29/2018   Procedure: REVERSE SHOULDER ARTHROPLASTY;  Surgeon: Netta Cedars, MD;  Location: [facility];  Service: Orthopedics;  Laterality: Right;  . RIGHT/LEFT HEART CATH AND CORONARY ANGIOGRAPHY N/A 04/23/2020   Procedure: RIGHT/LEFT HEART CATH AND CORONARY ANGIOGRAPHY;  Surgeon: Jolaine Artist, MD;  Location: [facility];  Service: Cardiovascular;  Laterality: N/A;  . ROUX-EN-Y GASTRIC BYPASS  1992   Conversion VBG to RnYGB in [place]  . SHOULDER ARTHROSCOPY WITH SUBACROMIAL DECOMPRESSION  2016   Dr Berenice Primas, [place], [place]  . SMALL INTESTINE SURGERY  2000   SBO - LOA w SB resection  . TOTAL KNEE ARTHROPLASTY Left 11/14/2018   Procedure: TOTAL KNEE ARTHROPLASTY;  Surgeon: Paralee Cancel, MD;  [facility];  Service: Orthopedics;  Laterality: Left;  70 mins  . TOTAL KNEE ARTHROPLASTY Right 04/29/2019   Procedure: TOTAL KNEE ARTHROPLASTY;  Surgeon: Paralee Cancel, MD;  [facility];  Service: Orthopedics;  Laterality: Right;  70 mins  . TOTAL THYROIDECTOMY  06/26/2012   Dr Celine Ahr, [facility]  . TUBAL LIGATION  1986  . URETHRAL DILATION  2012   w cystoscopy.  Dr Gaynelle Arabian  . UTERINE FIBROID EMBOLIZATION     Social History   Social History Narrative   Right handed   1 cup of caffeine per day        Immunization History  Administered Date(s) Administered  . Fluad Quad(high Dose 65+) 12/08/2018, 11/21/2019  . Influenza Inj Mdck Quad Pf 11/27/2017  . Influenza,inj,quad, With Preservative 02/22/2016, 03/05/2017  . Influenza-Unspecified 11/19/2014, 03/21/2016  . Moderna Sars-Covid-2 Vaccination 03/21/2019, 04/18/2019  . Pneumococcal Polysaccharide-23 05/01/2016  . Tdap 10/22/2018  . Zoster Recombinat (Shingrix) 01/09/2019     Objective: Vital Signs: There were no vitals taken for this visit.   Physical Exam   Musculoskeletal Exam: ***  CDAI Exam: CDAI Score: -- Patient Global: --; Provider Global: -- Swollen: --; Tender: -- Joint  Exam 09/08/2020   No joint exam has been documented for this visit   There is currently no information documented on the homunculus. Go to the Rheumatology activity and complete the homunculus joint exam.  Investigation: No additional findings.  Imaging: DG Chest 2 View  Result Date: 08/21/2020 CLINICAL DATA:  70 year old female with shortness of breath, left side chest pain for 2 days. EXAM: CHEST - 2 VIEW COMPARISON:  Chest radiographs 04/21/2020 and earlier. FINDINGS: PA and lateral views of the chest today. Stable single lead left chest cardiac pacemaker. Stable cardiomegaly and mediastinal contours. Visualized tracheal air column is within normal limits. The right lung remains clear but there is confluent new left lung base opacity which on the lateral view most resembles pleural effusion. No pulmonary edema or pneumothorax. Right shoulder arthroplasty.  No acute osseous abnormality identified. Numerous upper abdominal surgical clips and the visible bowel gas pattern are stable since February. IMPRESSION: 1. Confluent new left lung base opacity since March most resembles pleural effusion on the lateral view. However, confirmation of pleural fluid with CT or Ultrasound is recommended prior to attempted thoracentesis. 2. Otherwise stable cardiomegaly.  No pulmonary edema. Electronically Signed   By: Genevie Ann M.D.   On: 08/21/2020 11:15   CT CHEST WO CONTRAST  Result Date: 08/22/2020 CLINICAL DATA:  Possible pleural effusion versus mass EXAM: CT CHEST WITHOUT CONTRAST TECHNIQUE: Multidetector CT imaging of the chest was performed following the standard protocol without IV contrast. COMPARISON:  Chest x-ray 08/21/2020; CT scan of the chest 04/10/2020 FINDINGS: Cardiovascular: Limited evaluation in the absence of intravenous contrast. Cardiomegaly with marked right atrial enlargement. Left subclavian approach cardiac rhythm maintenance device with ICD. Tip terminates at the interventricular septum.  Marked enlargement of the main pulmonary artery. No pericardial effusion. Mediastinum/Nodes: Unremarkable CT appearance of the thyroid gland. No suspicious mediastinal or hilar adenopathy. No soft tissue mediastinal mass. The thoracic esophagus is unremarkable. Lungs/Pleura: Patchy airspace opacities throughout the left lower lobe consistent with pneumonia. There is a small associated pleural effusion and associated compressive atelectasis. Diffuse mild bronchial wall thickening. The remaining lungs are clear. No suspicious mass or nodule. Upper Abdomen: No acute abnormality. Evidence of prior gastric and gallbladder surgery. Musculoskeletal: No acute fracture or aggressive appearing lytic or blastic osseous lesion. IMPRESSION: 1. Imaging findings are most consistent with left lower lobe pneumonia with small likely parapneumonic effusion. 2. Enlarged main pulmonary artery as can be seen with pulmonary arterial hypertension. 3. Cardiomegaly with marked right atrial enlargement. Electronically Signed   By: Jacqulynn Cadet M.D.   On: 08/22/2020 10:19   CT Angio Chest PE W/Cm &/Or Wo Cm  Result Date: 08/21/2020 CLINICAL DATA:  Shortness of breath. High probability for pulmonary embolism. EXAM: CT ANGIOGRAPHY CHEST WITH CONTRAST TECHNIQUE: Multidetector CT imaging of the chest was performed using the standard protocol during bolus administration of intravenous contrast. Multiplanar CT image reconstructions and MIPs were obtained to evaluate the vascular anatomy. CONTRAST:  134m OMNIPAQUE IOHEXOL 350 MG/ML SOLN COMPARISON:  04/10/2020 FINDINGS: Cardiovascular: Satisfactory opacification of pulmonary arteries noted, and no pulmonary emboli identified. No evidence of thoracic aortic aneurysm or mediastinal hematoma. Moderate to severe cardiomegaly is noted, however there is no evidence of pericardial effusion. Mediastinum/Nodes: No masses or pathologically enlarged lymph nodes identified. Lungs/Pleura: A small left  pleural effusion is seen, with left lower lobe volume loss and atelectasis versus infiltrate in the central and medial left lower lobe. Central tracheobronchial airways are patent. Right lung is clear. Upper abdomen: Reflux of contrast into the IVC and hepatic veins is consistent with right heart insufficiency. Musculoskeletal: No suspicious bone lesions identified. Review of the MIP images confirms the above findings. IMPRESSION: No evidence of pulmonary embolism. Small left pleural effusion, with left lower lobe atelectasis versus infiltrate. Moderate to severe cardiomegaly, and findings of right heart insufficiency. Electronically Signed   By: JMarlaine HindM.D.   On: 08/21/2020 17:22    Recent Labs: Lab Results  Component Value Date   WBC 5.9 08/24/2020   HGB 10.4 (L) 08/24/2020   PLT 226 08/24/2020   NA 140 08/24/2020   K 3.9 08/24/2020   CL 102 08/24/2020   CO2 28 08/24/2020   GLUCOSE 92 08/24/2020   BUN 15 08/24/2020   CREATININE 0.80 08/24/2020   BILITOT 0.8 08/22/2020  ALKPHOS 82 08/22/2020   AST 23 08/22/2020   ALT 15 08/22/2020   PROT 6.4 (L) 08/22/2020   ALBUMIN 2.8 (L) 08/22/2020   CALCIUM 8.7 (L) 08/24/2020   GFRAA 84 06/11/2020    Speciality Comments: No specialty comments available.  Procedures:  No procedures performed Allergies: Oxycodone-acetaminophen   Assessment / Plan:     Visit Diagnoses: Elevated sed rate - Referred by Dr. Jana Hakim. 05/20/20: ESR 46. 07/12/20: ESR 49.  Orders: No orders of the defined types were placed in this encounter.  No orders of the defined types were placed in this encounter.   Face-to-face time spent with patient was *** minutes. Greater than 50% of time was spent in counseling and coordination of care.  Follow-Up Instructions: No follow-ups on file.   Ofilia Neas, PA-C  Note - This record has been created using Dragon software.  Chart creation errors have been sought, but may not always  have been located. Such  creation errors do not reflect on  the standard of medical care.

## 2020-08-31 DIAGNOSIS — M47896 Other spondylosis, lumbar region: Secondary | ICD-10-CM | POA: Diagnosis not present

## 2020-08-31 DIAGNOSIS — M47816 Spondylosis without myelopathy or radiculopathy, lumbar region: Secondary | ICD-10-CM | POA: Diagnosis not present

## 2020-09-01 DIAGNOSIS — J189 Pneumonia, unspecified organism: Secondary | ICD-10-CM | POA: Diagnosis not present

## 2020-09-01 DIAGNOSIS — E663 Overweight: Secondary | ICD-10-CM | POA: Diagnosis not present

## 2020-09-01 DIAGNOSIS — R700 Elevated erythrocyte sedimentation rate: Secondary | ICD-10-CM | POA: Diagnosis not present

## 2020-09-01 DIAGNOSIS — Z7689 Persons encountering health services in other specified circumstances: Secondary | ICD-10-CM | POA: Diagnosis not present

## 2020-09-01 DIAGNOSIS — D519 Vitamin B12 deficiency anemia, unspecified: Secondary | ICD-10-CM | POA: Diagnosis not present

## 2020-09-01 DIAGNOSIS — D513 Other dietary vitamin B12 deficiency anemia: Secondary | ICD-10-CM | POA: Diagnosis not present

## 2020-09-01 DIAGNOSIS — Z6827 Body mass index (BMI) 27.0-27.9, adult: Secondary | ICD-10-CM | POA: Diagnosis not present

## 2020-09-01 DIAGNOSIS — R42 Dizziness and giddiness: Secondary | ICD-10-CM | POA: Diagnosis not present

## 2020-09-01 DIAGNOSIS — D649 Anemia, unspecified: Secondary | ICD-10-CM | POA: Diagnosis not present

## 2020-09-03 ENCOUNTER — Other Ambulatory Visit: Payer: Self-pay

## 2020-09-03 ENCOUNTER — Encounter: Payer: Self-pay | Admitting: Internal Medicine

## 2020-09-03 ENCOUNTER — Ambulatory Visit (INDEPENDENT_AMBULATORY_CARE_PROVIDER_SITE_OTHER): Payer: Medicare Other | Admitting: Internal Medicine

## 2020-09-03 VITALS — BP 140/88 | HR 86 | Ht 67.0 in | Wt 172.2 lb

## 2020-09-03 DIAGNOSIS — E890 Postprocedural hypothyroidism: Secondary | ICD-10-CM | POA: Diagnosis not present

## 2020-09-03 DIAGNOSIS — E559 Vitamin D deficiency, unspecified: Secondary | ICD-10-CM

## 2020-09-03 DIAGNOSIS — J189 Pneumonia, unspecified organism: Secondary | ICD-10-CM | POA: Diagnosis not present

## 2020-09-03 DIAGNOSIS — E162 Hypoglycemia, unspecified: Secondary | ICD-10-CM | POA: Diagnosis not present

## 2020-09-03 DIAGNOSIS — D519 Vitamin B12 deficiency anemia, unspecified: Secondary | ICD-10-CM | POA: Diagnosis not present

## 2020-09-03 DIAGNOSIS — M810 Age-related osteoporosis without current pathological fracture: Secondary | ICD-10-CM | POA: Diagnosis not present

## 2020-09-03 DIAGNOSIS — D649 Anemia, unspecified: Secondary | ICD-10-CM | POA: Diagnosis not present

## 2020-09-03 DIAGNOSIS — C73 Malignant neoplasm of thyroid gland: Secondary | ICD-10-CM

## 2020-09-03 MED ORDER — DENOSUMAB 60 MG/ML ~~LOC~~ SOSY
60.0000 mg | PREFILLED_SYRINGE | Freq: Once | SUBCUTANEOUS | Status: AC
  Administered 2020-09-03: 60 mg via SUBCUTANEOUS

## 2020-09-03 NOTE — Patient Instructions (Signed)
Please continue: - Vitamin D 50,000 U weekly - Calcitriol 0.25 mcg 2x a day - calcium 1000 mg 2x daily  Also, continue levothyroxine 150 mcg daily.  Take the thyroid hormone every day, with water, at least 30 minutes before breakfast, separated by at least 4 hours from: - acid reflux medications - calcium - iron - multivitamins  Please come back for a follow-up appointment in 6 months.

## 2020-09-03 NOTE — Progress Notes (Signed)
Patient ID: Jillian Eaton, female   DOB: February 29, 1950, 70 y.o.   MRN: [id number]   This visit occurred during the SARS-CoV-2 public health emergency.  Safety protocols were in place, including screening questions prior to the visit, additional usage of staff PPE, and extensive cleaning of exam room while observing appropriate contact time as indicated for disinfecting solutions.   HPI  Jillian Eaton is a 70 y.o.-year-old female, initially referred by her PCP, Dr. Sabas Sous, returning for h/o thyroid microcarcinoma,  postsurgical hypothyroidism, postsurgical hypocalcemia, osteoporosis, and hypoglycemia. Last visit 4 months ago.  Interim history: Since last visit, she was admitted with sepsis and respiratory failure due to pneumonia 08/21/2020.  Reviewing her hospital records, her calcium was only mildly low at 8.7 on 08/24/2020 and lowest glucose was 68.  Magnesium level was slightly low, at 1.5, but she was reportedly not given magnesium supplements in the hospital.  This normalized before discharge.  As of now, she is feeling well, without shortness of breath, chest pain, fever. She is taking Lasix as needed for leg swelling and this helps significantly. She denies blurry vision, increased urination (when she is not taking Lasix), nausea. Also denies muscle cramps or numbness around her mouth.  Reviewed history: -Patient was admitted to the hospital (04/21/2020) for shortness of breath, fatigue, orthostatic hypotension in the setting of severe malnutrition.  She was found to have multiple nutrient deficiencies.  She had investigation by cardiology and pulmonary without new pathology found.  However, she was found to be hypoglycemic and her hypoglycemia persisted throughout her hospitalization.  Lowest blood sugars in the 30s.  This happened throughout the day and also at night.  She was discharged with blood sugars in the 50s.  After discharge, she did very well -sugars mostly 60s  to 100, occasionally slightly higher, and few values in the 50s.  -Before our visit from 12/2019, she had a 20 pound weight loss in the previous month.  An MRI showed a large pericolic fluid collection.  She had a CT scan before last visit and this did not show any intra-abdominal pathology to account for this collection.  She had another contrasted CT scan while in the hospital in 04/2020 With essentially the same results.  The adrenal glands and pancreas did not show any pathology.  Reviewed hypoglycemia work-up in the hospital:  Sulfonylurea screen was negative, insulin antibodies were slightly elevated, however, the insulin level was not very high.  In fact, closer to discharge, this has decreased to under 2. Her pro insulin was not very high.  C-peptide was detectable but decreased.  Beta hydroxybutyrate was low.  No signs of adrenal insufficiency-cosyntropin stimulation test was normal: Component     Latest Ref Rng & Units 04/23/2020  Cortisol, Base     ug/dL 6.5  Cortisol, 30 Min     ug/dL 16.7  Cortisol, 60 Min     ug/dL 20.0   In the hospital, she was evaluated by nutrition and placed on frequent feedings.  She was sent home with a glucometer.  She denied the use of: -Methimazole in the past -Alpha-lipoic acid  Glucometer: One Touch Verio Flex  She had a very low blood sugar, at 29 on CMP from 05/08/2018 (see below).  She was not symptomatic at that time. She also had a seizure in 05/2018, believed to be due to stopping Lyrica but now retrospectively -?  If caused by hypoglycemia.  PTC and Hypothyroidism:  Patient has a history of nontoxic  multinodular goiter with worsening neck compression symptoms, for which she had total thyroidectomy in 06/2012 by Dr. Fredirick Maudlin at [facility].  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  She developed postsurgical hypothyroidism and, unfortunately, also postsurgical hypocalcemia, both  uncontrolled.  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      Benign hyperplastic thyroid tissue.      See comment. COMMENT: The papillary carcinoma appears to be incidental.  Lab Results  Component Value Date   THYROGLB 1.0 (L) 07/08/2019   Lab Results  Component Value Date   THGAB <1 07/08/2019   Pt denies: - feeling nodules in neck - hoarseness - dysphagia - choking - SOB with lying down  On 06/19/2017 she developed atrial fibrillation.  At that time, her TSH is very suppressed, at 0.07.  She was previously on 300 mcg levothyroxine daily, gradually decreased-currently on 175 mcg daily: - in am - fasting - at least 45 min from b'fast - + calcium 2x a day - no iron (had 2 iron infusions since last OV as she does have IDA) - + multivitamins in am but 4h after LT4 - no PPIs - not on Biotin  Reviewed her TFTs: Lab Results  Component Value Date   TSH 1.88 08/20/2020   TSH 6.67 (H) 07/14/2020   TSH 6.33 (H) 06/11/2020   TSH 4.487 04/22/2020   TSH 1.70 01/08/2020   TSH 0.53 08/20/2019   TSH 0.13 (L) 07/08/2019   TSH 7.71 (H) 06/13/2019   TSH 0.05 (L) 01/06/2019   TSH 1.21 11/27/2017   FREET4 1.03 08/20/2020   FREET4 0.87 07/14/2020   FREET4 0.89 06/11/2020   FREET4 1.11 01/08/2020   FREET4 1.11 08/20/2019   FREET4 1.58 07/08/2019   FREET4 1.47 01/06/2019   FREET4 1.41 11/27/2017   FREET4 2.87 (H) 09/21/2017   FREET4 1.04 08/03/2017   T3FREE 1.0 (L) 06/14/2019  05/08/2018: TSH 2.87  Pt denies: - feeling nodules in neck - hoarseness - choking - SOB with lying down But she had chronic dysphagia >> resolved since last OV.  No FH of thyroid cancer. No h/o radiation tx to head or neck.  No herbal supplements. No Biotin use. No recent steroids use.  Hypocalcemia:  She developed iatrogenic hypocalcemia after her thyroid surgery.  Her PTH was inappropriately normal,  possibly related to her CKD.  She was admitted with hypocalcemia and acute respiratory failure on 07/10/2017.  She again had hypocalcemia at her recent admission in 04/2019 along with SOB, fatigue.  Reviewed pertinent labs:  Lab Results  Component Value Date   PTH 40 11/27/2017   PTH 44 08/03/2017   PTH 65 07/10/2017   PTH Comment 07/10/2017   CALCIUM 8.7 (L) 08/24/2020   CALCIUM 8.5 (L) 08/23/2020   CALCIUM 8.6 (L) 08/22/2020   CALCIUM 9.0 08/21/2020   CALCIUM 9.2 07/12/2020   CALCIUM 8.1 (L) 06/11/2020   CALCIUM 8.0 (L) 05/04/2020   CALCIUM 7.8 (L) 04/29/2020   CALCIUM 7.5 (L) 04/26/2020   CALCIUM 7.0 (L) 04/25/2020  07/14/2020: Ionized calcium 4.85 01/08/2020: Ionized calcium 4.7 08/20/2019: Ionized calcium 4.09 (4.8-5.6) 10/09/2017: Ca 8.1 (8.7-10.3)  She has a history of low magnesium and high phosphorus.  Magnesium was normal at last check: Lab Results  Component Value Date   MG 1.7 08/24/2020   MG 1.5 (L) 08/23/2020   MG 1.7 05/04/2020   MG  1.7 04/29/2020   MG 1.6 (L) 04/22/2020   MG 2.0 06/19/2019   MG 2.1 06/18/2019   MG 2.2 06/17/2019   MG 1.8 06/16/2019   MG 1.7 06/05/2019   MG 1.4 (L) 12/07/2018   MG 1.6 11/27/2017   MG 1.5 08/03/2017   MG 2.1 07/15/2017   MG 1.6 (L) 07/14/2017   MG 1.3 (L) 07/13/2017   MG 1.2 (L) 07/10/2017   MG 1.8 07/11/2016   MG 2.2 07/09/2016   MG 1.6 (L) 07/09/2016  10/09/2017: Mg 2.0 (1.6-2.3)  Lab Results  Component Value Date   PHOS 4.5 07/14/2020   PHOS 3.2 06/16/2019   PHOS 3.8 11/27/2017   PHOS 6.3 (H) 08/03/2017   PHOS 7.4 (H) 07/10/2017   PHOS 3.6 07/11/2016   PHOS 4.0 07/09/2016   PHOS 3.5 07/09/2016   PHOS 5.2 (H) 07/08/2016   PHOS 6.6 (H) 07/08/2016   PHOS 8.0 (H) 07/08/2016   PHOS 3.6 04/25/2016   PHOS 3.5 04/25/2016   PHOS 3.3 04/24/2016   PHOS 3.7 03/12/2016   PHOS 6.2 (H) 03/10/2016  10/09/2017: phos 4.5 (2.5-4.5)  05/08/2018: -CMP: Glucose 29 (!),  BUN/creatinine 19/0.94, GFR 74, calcium 8.3  (8.7-10.3), Albumin 3.6 (3.8-4.8) -Vitamin D 39.8 -TSH 2.87 -Phosphorus 3.6 (3-4.3) -Magnesium 1.6 (1.6-2.3)  -Ferritin 29 (15-150), hemoglobin 11.1 (11.1-15.9) Corrected calcium: 8.62, slightly lower than normal, which is our target. I was surprised by her very low glucose (possibly lab artifact as she did not have any symptoms at the time of the blood draw).  She does not have a history of diabetes but has steroid-induced hyperglycemia...   She is on the following regimen: -  >> she stopped taking it in 08/2018 (!!) >> restarted 12/2018: Now takes 2000 mg with D - high-dose vitamin D 50,000 units weekly - calcitriol 0.5 mcg 2x a day (she did not decrease to 0.25 mcg 2x a day as advised at last visit, and she forgot) >> 0.25 mcg daily >> 0.25 mcg twice a day  We also started HCTZ 25 mg daily.  In the meantime, we reduced her Lasix to 50% of her previous dose.  Now taking it as needed.  She did not take it since 09/2019.  She denies hand cramping or perioral numbness.  History of vitamin D deficiency.    Vitamin D levels were normal: Lab Results  Component Value Date   VD25OH 56.1 07/14/2020   VD25OH 83.09 07/12/2020   VD25OH 52.0 06/11/2020   VD25OH 58.6 01/08/2020   VD25OH 65.6 07/08/2019   VD25OH 107.02 (H) 06/16/2019   VD25OH 63.90 01/06/2019   VD25OH 45.84 11/27/2017   VD25OH 43.9 07/14/2017  05/08/2018: Vitamin D 39.8  Calcitriol level was not elevated: Component     Latest Ref Rng & Units 07/14/2020  Vitamin D 1, 25 (OH) Total     18 - 72 pg/mL 51  Vitamin D3 1, 25 (OH)     pg/mL 33  Vitamin D2 1, 25 (OH)     pg/mL 18   Component     Latest Ref Rng & Units 11/27/2017  Vitamin D 1, 25 (OH) Total     18 - 72 pg/mL 54  Vitamin D3 1, 25 (OH)     pg/mL 30  Vitamin D2 1, 25 (OH)     pg/mL 24   Osteoporosis:  Reviewed previous DXA scan reports:  DXA - [facility] (01/09/2020) Lumbar spine L1-L4 (L2) Femoral neck (FN) 33% distal radius Ultra distal radius  T-score -0.9  RFN: -1.1 LFN: -1.8 -2.0 -3.8  Change in BMD from previous DXA test (%)  +8.9%* RFN: n/a LFN: +21.9%* n/a  -4.5%  (*) statistically significant   DXA  - [facility] (12/10/2017) Lumbar spine L1-L4 (L2) Femoral neck (FN) 33% distal left radius Ultra distal left radius  T-score -1.6 RFN: N/a LFN: -2.8  -2.3  -3.5   She is on Prolia: 01/03/2018 07/16/2018 01/21/2019 08/05/2019 02/04/2020 She is due for another injection today.  She tolerates this well.  No jaw, hip, thigh pain.  + CKD. Last BUN/Cr: Lab Results  Component Value Date   BUN 15 08/24/2020   CREATININE 0.80 08/24/2020   She surgery for torn rotator cuffs in both shoulders. She has atrial fibrillation and is s/p cardioversion. She was on amiodarone before, now off. She was admitted 07/2018 with SBO and 11/2018 with acute on chronic respiratory failure and pulmonary hypertension. She had right TKR 02/18/2019.  She had left TKR 11/14/2018. She had a seizure 05/2018.  She was suspected for stroke, but this was ruled out. Of note, upon questioning, she stopped Lyrica 1 week prior to the seizure.  She was taking this for fibromyalgia.  She was started on Keppra. She has a history of kidney stones and sees urology.  She had cystoscopy with stent placement 07/2019.  CT scan showed a 3 mm kidney stone in the right kidney upper pole -this is nonobstructing. She also continues to see Dr. Jana Hakim for her anemia.  She feels much better after she started Feraheme in August. She sees Dr. Benson Norway with GI.  ROS: Constitutional: + weight loss, no fatigue, no subjective hyperthermia, no subjective hypothermia Eyes: no blurry vision, no xerophthalmia ENT: no sore throat, + see HPI, + B LE edema, + nasal drainage Cardiovascular: no CP/no SOB/no palpitations/+ leg swelling Respiratory: no cough/no SOB/no wheezing Gastrointestinal: no N/no V/no D/no C/no acid reflux Musculoskeletal: no muscle aches/no joint aches Skin: no rashes, no hair  loss Neurological: no tremors/no numbness/no tingling/no dizziness  I reviewed pt's medications, allergies, PMH, social hx, family hx, and changes were documented in the history of present illness. Otherwise, unchanged from my initial visit note.  Past Medical History:  Diagnosis Date   Anemia    iron and pernicious   Arthritis    Asthma    Cancer (HCC)    CHF (congestive heart failure) ([place]) 9480   Complication of anesthesia    BP dropped    DOE (dyspnea on exertion)    2D ECHO, 02/12/2012 - EF 60-65%, moderate concentric hypertrophy   Dyspnea    Dysrhythmia    a-fib   Fibromyalgia    nerve pain"left side at waist level" "can't lay on that side without pain" , "HOB elevation helps"; pt. thinks this has resolved after 200lb weight loss9-17- since i lost the wieght , i dont  think i have this anymore    Headache 04/2019   Heart murmur    Hematuria - cause not known    resolved    History of COVID-19    History of kidney stones    x 2 '13, '14 surgery to remove   Hypertension    Pneumonia    aspiration  04/2016   Presence of permanent cardiac pacemaker    SBO (small bowel obstruction) ([place])    rqueired admission 2020   Thyroid disease    "goiter"   Transfusion history    10 yrs+   Past Surgical History:  Procedure Laterality Date  ABDOMINAL ADHESION SURGERY  2004   open LOA - in [place]   AV NODE ABLATION N/A 06/20/2019   Procedure: AV NODE ABLATION;  Surgeon: Evans Lance, MD;  Location: [place] [facility];  Service: Cardiovascular;  Laterality: N/A;   BALLOON DILATION N/A 07/25/2019   Procedure: BALLOON DILATION;  Surgeon: Carol Ada, MD;  Location: [facility];  Service: Endoscopy;  Laterality: N/A;   CARDIAC CATHETERIZATION  04/04/2010   No significant obstructive coronary artery disease   CARDIOVERSION N/A 08/08/2017   Procedure: CARDIOVERSION;  Surgeon: Pixie Casino, MD;  Location: [facility];  Service: Cardiovascular;  Laterality: N/A;   CARDIOVERSION N/A  06/18/2019   Procedure: CARDIOVERSION;  Surgeon: Pixie Casino, MD;  Location: [facility];  Service: Cardiovascular;  Laterality: N/A;   CHOLECYSTECTOMY  1990   COLONOSCOPY W/ POLYPECTOMY     COLONOSCOPY WITH PROPOFOL N/A 04/10/2014   Procedure: COLONOSCOPY WITH PROPOFOL;  Surgeon: Beryle Beams, MD;  Location: [facility];  Service: Endoscopy;  Laterality: N/A;   CYSTOSCOPY/URETEROSCOPY/HOLMIUM LASER/STENT PLACEMENT Right 08/13/2019   Procedure: CYSTOSCOPY/RETROGRADE/URETEROSCOPY/HOLMIUM LASER/STENT PLACEMENT;  Surgeon: Ceasar Mons, MD;  Location: [facility];  Service: Urology;  Laterality: Right;  ONLY NEEDS 45 MIN   ENTEROSCOPY N/A 08/13/2020   Procedure: ENTEROSCOPY;  Surgeon: Carol Ada, MD;  Location: [facility];  Service: Endoscopy;  Laterality: N/A;   ESOPHAGOGASTRODUODENOSCOPY (EGD) WITH PROPOFOL N/A 07/25/2019   Procedure: ESOPHAGOGASTRODUODENOSCOPY (EGD) WITH PROPOFOL;  Surgeon: Carol Ada, MD;  [facility];  Service: Endoscopy;  Laterality: N/A;   EYE SURGERY     lasik 20-25 yrs ago   GASTROPLASTY VERTICAL BANDED  [place]   In [place] for SBO   [place]  07/25/2019   Procedure: MALONEY DILATION;  Surgeon: Carol Ada, MD;  Location: [facility];  Service: Endoscopy;;   PACEMAKER IMPLANT N/A 06/20/2019   Procedure: PACEMAKER IMPLANT;  Surgeon: Evans Lance, MD;  Location: [place] [facility];  Service: Cardiovascular;  Laterality: N/A;   REVERSE SHOULDER ARTHROPLASTY Right 03/29/2018   Procedure: REVERSE SHOULDER ARTHROPLASTY;  Surgeon: Netta Cedars, MD;  Location: [facility];  Service: Orthopedics;  Laterality: Right;   RIGHT/LEFT HEART CATH AND CORONARY ANGIOGRAPHY N/A 04/23/2020   Procedure: RIGHT/LEFT HEART CATH AND CORONARY ANGIOGRAPHY;  Surgeon: Jolaine Artist, MD;  Location: [place] [facility];  Service: Cardiovascular;  Laterality: N/A;   ROUX-EN-Y GASTRIC BYPASS  1992   Conversion VBG to  RnYGB in [place], [place]   SHOULDER ARTHROSCOPY WITH SUBACROMIAL DECOMPRESSION  2016   Dr Jannett Celestine, [place]   SMALL INTESTINE SURGERY  2000   SBO - LOA w SB resection   TOTAL KNEE ARTHROPLASTY Left 11/14/2018   Procedure: TOTAL KNEE ARTHROPLASTY;  Surgeon: Paralee Cancel, MD;  Location: [facility];  Service: Orthopedics;  Laterality: Left;  70 mins   TOTAL KNEE ARTHROPLASTY Right 04/29/2019   Procedure: TOTAL KNEE ARTHROPLASTY;  Surgeon: Paralee Cancel, MD;  Location: [facility];  Service: Orthopedics;  Laterality: Right;  70 mins   TOTAL THYROIDECTOMY  06/26/2012   Dr Celine Ahr, [facility]   TUBAL LIGATION  1986   URETHRAL DILATION  2012   w cystoscopy.  Dr Gaynelle Arabian   UTERINE FIBROID EMBOLIZATION     Social History   Socioeconomic History   Marital status: Married    Spouse name: Not on file   Number of children: 3   Years of education: Not on file   Highest education  level: Master's degree (e.g., MA, MS, MEng, MEd, MSW, MBA)  Occupational History   Occupation: retired  Tobacco Use   Smoking status: Never   Smokeless tobacco: Never  Vaping Use   Vaping Use: Never used  Substance and Sexual Activity   Alcohol use: No    Comment: wine occ   Drug use: No    Types: Oxycodone    Comment: perscribed   Sexual activity: Not Currently    Birth control/protection: None  Other Topics Concern   Not on file  Social History Narrative   Right handed   1 cup of caffeine per day        Social Determinants of Health   Financial Resource Strain: Not on file  Food Insecurity: Not on file  Transportation Needs: Not on file  Physical Activity: Not on file  Stress: Not on file  Social Connections: Not on file  Intimate Partner Violence: Not on file   Current Outpatient Medications on File Prior to Visit  Medication Sig Dispense Refill   acetaminophen (TYLENOL) 650 MG CR tablet Take 1,300 mg by mouth every 8 (eight) hours as needed for pain.     apixaban (ELIQUIS) 5 MG TABS tablet  Take 1 tablet (5 mg total) by mouth 2 (two) times daily. 60 tablet 0   benzonatate (TESSALON) 100 MG capsule Take 1 capsule (100 mg total) by mouth 2 (two) times daily as needed for cough. 20 capsule 0   calcitRIOL (ROCALTROL) 0.25 MCG capsule Take 1 capsule (0.25 mcg total) by mouth 2 (two) times daily. 180 capsule 3   calcium elemental as carbonate (TUMS ULTRA 1000) 400 MG chewable tablet Chew 1,000 mg by mouth 2 (two) times daily.     cyanocobalamin (,VITAMIN B-12,) 1000 MCG/ML injection Inject 1 mL (1,000 mcg total) into the muscle every 30 (thirty) days. 1 mL 0   Docusate Sodium (DSS) 100 MG CAPS Take 100 mg by mouth every morning.     eszopiclone (LUNESTA) 2 MG TABS tablet Take 1 tablet (2 mg total) by mouth at bedtime. 20 tablet 0   furosemide (LASIX) 20 MG tablet Take 20 mg by mouth daily as needed for edema.     guaiFENesin 200 MG tablet Take 1 tablet (200 mg total) by mouth every 4 (four) hours as needed for cough or to loosen phlegm. 30 tablet 0   HYDROcodone-acetaminophen (NORCO) 10-325 MG tablet Take 1 tablet by mouth 3 (three) times daily as needed (pain).     levothyroxine (SYNTHROID) 175 MCG tablet Take 1 tablet (175 mcg total) by mouth daily. 45 tablet 3   Magnesium 400 MG TABS Take 400 mg by mouth every morning.     Multiple Vitamins-Minerals (MULTIVITAMIN WITH MINERALS) tablet Take 1 tablet by mouth daily. bariatric     Potassium 99 MG TABS Take 99 mg by mouth every morning.     traMADol (ULTRAM) 50 MG tablet Take 50 mg by mouth 2 (two) times daily as needed (pain).     traZODone (DESYREL) 50 MG tablet Take 50 mg by mouth at bedtime.     VITAMIN A PO Take 1 capsule by mouth every morning.     Vitamin D, Ergocalciferol, (DRISDOL) 1.25 MG (50000 UT) CAPS capsule Take 1 capsule (50,000 Units total) by mouth every Wednesday. 4 capsule 0   No current facility-administered medications on file prior to visit.   Allergies  Allergen Reactions   Oxycodone-Acetaminophen Itching, Nausea  And Vomiting, Rash and Other (See Comments)  Family History  Problem Relation Age of Onset   Diabetes Mother    Epilepsy Mother    Cancer Mother        Breast   Hypertension Mother    Breast cancer Mother    Seizures Mother    Kidney disease Father    Diabetes Father    Hypertension Father    Asthma Father    Heart disease Father    Epilepsy Sister    Cancer Maternal Grandmother    Breast cancer Maternal Grandmother    Cancer Paternal Grandmother    Breast cancer Paternal Grandmother     PE: BP 140/88 (BP Location: Right Arm, Patient Position: Sitting, Cuff Size: Normal)   Pulse 86   Ht 5' 7"  (1.702 m)   Wt 172 lb 3.2 oz (78.1 kg)   SpO2 96%   BMI 26.97 kg/m  Wt Readings from Last 3 Encounters:  09/03/20 172 lb 3.2 oz (78.1 kg)  08/24/20 170 lb 11.2 oz (77.4 kg)  08/18/20 172 lb (78 kg)   Constitutional: overweight, in NAD Eyes: PERRLA, EOMI, no exophthalmos ENT: moist mucous membranes, no thyromegaly, no cervical lymphadenopathy Cardiovascular: RRR, No MRG, + LE moderate periankle edema bilaterally Respiratory: CTA B Gastrointestinal: abdomen soft, NT, ND, BS+ Musculoskeletal: no deformities, strength intact in all 4 Skin: moist, warm, no rashes Neurological: no tremor with outstretched hands, DTR normal in all 4  Assessment:  Hypoglycemia -Most likely due to malnutrition -Looking back at her CMP from 04/2018, I am wondering whether the glucose of 29 may not have been a lab error... Also, question if her seizure from 05/2018 was related to hypoglycemia...  Papillary thyroid cancer  Postsurgical hypothyroidism  Postsurgical  Hypocalcemia  Osteoporosis  Vitamin D deficiency   PLAN: 1.  H/o Hypoglycemia -Please review my previous note for more details -This appeared to have been due to malnutrition in the setting of decreased intake after gastric bypass. -It has resolved since the 04/2020 hospitalization -Latest HbA1c was 4%  2. PTC -She  had an incidentally found focus of micropapillary thyroid cancer, which was discovered after her thyroidectomy for enlarging nontoxic multinodular goiter. -Since the cancer was so small, this was most likely cured by her thyroidectomy -No neck compression symptoms -At last check, ATA antibodies were undetectable and thyroglobulin was very low -We will repeat these at next visit -I will see her back in 6 months  3. Patient with hypothyroidism developed after thyroidectomy - latest thyroid labs reviewed with pt. >> normal: Lab Results  Component Value Date   TSH 1.88 08/20/2020  - she continues on LT4 175 mcg daily (3 06/2020) - pt feels good on this dose. - we discussed about taking the thyroid hormone every day, with water, >30 minutes before breakfast, separated by >4 hours from acid reflux medications, calcium, iron, multivitamins. Pt. is taking it correctly.  4. Patient with postoperative, iatrogenic hypocalcemia -In 2019, she had a very low blood calcium of 5.5.  Vitamin D level was normal.  She does have a history of magnesium deficiency and high phosphorus.  Restarted calcitriol and her calcium levels improved and her magnesium and phosphorus normalized.  Desirable calcium levels are at the lower limit of normal or slightly higher -At the end of 2021, she was off calcium supplements for 4 months.  An ionized calcium was normal, 4.9 (4.8-5.6).  Afterwards, I recommended to start calcium tablets with dinner.  We increased the dose before last visit. -Latest calcium level was only slightly  low and phosphorus was normal: Lab Results  Component Value Date   CALCIUM 8.7 (L) 08/24/2020   PHOS 4.5 07/14/2020  -She is asymptomatic -Vitamin D level was normal at last check: Lab Results  Component Value Date   VD25OH 56.1 07/14/2020   VD25OH 83.09 07/12/2020  -We will not repeat this today -She continues on calcium 1000 mg twice a day -She also continues calcitriol 0.25 mcg twice a  day -We discussed in the past that Natpara was retracted from the market due to presence of impurities  5. Osteoporosis -No falls or fractures since last visit -Latest DXA scan reports was reviewed: Increased T-scores at the spine and left femoral neck and lowest BMD at the ultra distal radius -She will be due from 10 in 12/2021. -We started Prolia in 12/2017 and she continues without side effects: No jaw/thigh/hip pain -We have to pay attention to her calcium due to her postsurgical hypoparathyroidism -Plan to continue Prolia for at least 6 years, but we can continue for 10 years or more if needed -We will give her a Prolia injection today  6.  Vitamin D deficiency -Latest vitamin D level was normal 2 months ago -She continues on high-dose ergocalciferol 50,000 units weekly -No  joint pains  Philemon Kingdom, MD PhD [facility] Endocrinology

## 2020-09-03 NOTE — Addendum Note (Signed)
Addended by: Lauralyn Primes on: 09/03/2020 10:59 AM   Modules accepted: Orders

## 2020-09-03 NOTE — Progress Notes (Signed)
Prolia injection administered to pt's right arm. Pt tolerated well.

## 2020-09-04 NOTE — Telephone Encounter (Signed)
Pt received Prolia inj 09/03/20 Next due 03/07/21

## 2020-09-07 DIAGNOSIS — G894 Chronic pain syndrome: Secondary | ICD-10-CM | POA: Diagnosis not present

## 2020-09-07 DIAGNOSIS — M47816 Spondylosis without myelopathy or radiculopathy, lumbar region: Secondary | ICD-10-CM | POA: Diagnosis not present

## 2020-09-07 DIAGNOSIS — N183 Chronic kidney disease, stage 3 unspecified: Secondary | ICD-10-CM | POA: Diagnosis not present

## 2020-09-08 ENCOUNTER — Ambulatory Visit: Payer: Medicare Other | Admitting: Rheumatology

## 2020-09-14 ENCOUNTER — Telehealth: Payer: Self-pay | Admitting: *Deleted

## 2020-09-14 DIAGNOSIS — I5032 Chronic diastolic (congestive) heart failure: Secondary | ICD-10-CM | POA: Insufficient documentation

## 2020-09-14 NOTE — Telephone Encounter (Signed)
Patient with diagnosis of afib on Eliquis for anticoagulation.    Procedure: Anterograde assisted enteroscopy Date of procedure: 09/27/20  CHA2DS2-VASc Score = 4  This indicates a 4.8% annual risk of stroke. The patient's score is based upon: CHF History: Yes HTN History: Yes Diabetes History: No Stroke History: No Vascular Disease History: No Age Score: 1 Gender Score: 1   CrCl 49m/min using adjusted body weight Platelet count 226K  Per office protocol, patient can hold Eliquis for 1-2 days prior to procedure.

## 2020-09-14 NOTE — Telephone Encounter (Signed)
[facility] Pre-operative Risk Assessment    Patient Name: Jillian Eaton [facility]  DOB: 1950/04/20 MRN: [id number]  Request for surgical clearance:  What type of surgery is being performed? Anterograde assisted enteroscopy  When is this surgery scheduled? 09/27/20  What type of clearance is required (medical clearance vs. Pharmacy clearance to hold med vs. Both)? pharmacy  Are there any medications that need to be held prior to surgery and how long? Eliquis  Practice name and name of physician performing surgery? [facility]  What is the office phone number? [phone]   7.   What is the office fax number? [phone]  8.   Anesthesia type (None, local, MAC, general) ?    Jillian Eaton 09/14/2020, 1:16 PM  _________________________________________________________________   (provider comments below)

## 2020-09-15 DIAGNOSIS — N1831 Chronic kidney disease, stage 3a: Secondary | ICD-10-CM | POA: Diagnosis not present

## 2020-09-15 DIAGNOSIS — D509 Iron deficiency anemia, unspecified: Secondary | ICD-10-CM | POA: Diagnosis not present

## 2020-09-15 DIAGNOSIS — C73 Malignant neoplasm of thyroid gland: Secondary | ICD-10-CM | POA: Diagnosis not present

## 2020-09-15 DIAGNOSIS — Z9884 Bariatric surgery status: Secondary | ICD-10-CM | POA: Diagnosis not present

## 2020-09-15 DIAGNOSIS — I5032 Chronic diastolic (congestive) heart failure: Secondary | ICD-10-CM | POA: Diagnosis not present

## 2020-09-16 ENCOUNTER — Other Ambulatory Visit: Payer: Self-pay | Admitting: Internal Medicine

## 2020-09-16 ENCOUNTER — Encounter: Payer: Self-pay | Admitting: Oncology

## 2020-09-16 ENCOUNTER — Ambulatory Visit
Admission: RE | Admit: 2020-09-16 | Discharge: 2020-09-16 | Disposition: A | Discharge status: Home / Self Care | Payer: Medicare Other | Source: Ambulatory Visit | Attending: Internal Medicine | Admitting: Internal Medicine

## 2020-09-16 DIAGNOSIS — J189 Pneumonia, unspecified organism: Secondary | ICD-10-CM

## 2020-09-16 DIAGNOSIS — I517 Cardiomegaly: Secondary | ICD-10-CM | POA: Diagnosis not present

## 2020-09-16 DIAGNOSIS — M47814 Spondylosis without myelopathy or radiculopathy, thoracic region: Secondary | ICD-10-CM | POA: Diagnosis not present

## 2020-09-16 DIAGNOSIS — Z8701 Personal history of pneumonia (recurrent): Secondary | ICD-10-CM | POA: Diagnosis not present

## 2020-09-16 DIAGNOSIS — M19012 Primary osteoarthritis, left shoulder: Secondary | ICD-10-CM | POA: Diagnosis not present

## 2020-09-16 NOTE — Telephone Encounter (Signed)
   Primary Cardiologist: Pixie Casino, MD  Chart reviewed as part of pre-operative protocol coverage by clinical pharmacist.  [facility] has received the following recommendations  Patient with diagnosis of afib on Eliquis for anticoagulation.     Procedure: Anterograde assisted enteroscopy Date of procedure: 09/27/20   CHA2DS2-VASc Score = 4  This indicates a 4.8% annual risk of stroke. The patient's score is based upon: CHF History: Yes HTN History: Yes Diabetes History: No Stroke History: No Vascular Disease History: No Age Score: 1 Gender Score: 1   CrCl 30m/min using adjusted body weight Platelet count 226K   Per office protocol, patient can hold Eliquis for 1-2 days prior to procedure.  I will route this recommendation to the requesting party via Epic fax function and remove from pre-op pool.  Please call with questions.  JJossie Ng Najee Manninen NP-C    09/16/2020, 8:18 AM [email or web address] Office [phone] [phone]

## 2020-09-17 ENCOUNTER — Ambulatory Visit (INDEPENDENT_AMBULATORY_CARE_PROVIDER_SITE_OTHER): Payer: Medicare Other

## 2020-09-17 DIAGNOSIS — I442 Atrioventricular block, complete: Secondary | ICD-10-CM

## 2020-09-18 LAB — CUP PACEART REMOTE DEVICE CHECK
Battery Remaining Longevity: 134 mo
Battery Voltage: 3.04 V
Brady Statistic AP VP Percent: 0 %
Brady Statistic AP VS Percent: 0 %
Brady Statistic AS VP Percent: 99.85 %
Brady Statistic AS VS Percent: 0.15 %
Brady Statistic RA Percent Paced: 0 %
Brady Statistic RV Percent Paced: 99.85 %
Date Time Interrogation Session: 20220728222032
Implantable Lead Implant Date: 20210430
Implantable Lead Location: 753860
Implantable Lead Model: 3830
Implantable Pulse Generator Implant Date: 20210430
Lead Channel Impedance Value: 3382 Ohm
Lead Channel Impedance Value: 3382 Ohm
Lead Channel Impedance Value: 399 Ohm
Lead Channel Impedance Value: 570 Ohm
Lead Channel Pacing Threshold Amplitude: 0.625 V
Lead Channel Pacing Threshold Pulse Width: 0.4 ms
Lead Channel Sensing Intrinsic Amplitude: 15.875 mV
Lead Channel Sensing Intrinsic Amplitude: 15.875 mV
Lead Channel Setting Pacing Amplitude: 2.5 V
Lead Channel Setting Pacing Pulse Width: 0.4 ms
Lead Channel Setting Sensing Sensitivity: 4 mV

## 2020-09-20 ENCOUNTER — Encounter: Payer: Self-pay | Admitting: Adult Health

## 2020-09-20 ENCOUNTER — Ambulatory Visit (INDEPENDENT_AMBULATORY_CARE_PROVIDER_SITE_OTHER): Payer: Medicare Other | Admitting: Adult Health

## 2020-09-20 ENCOUNTER — Other Ambulatory Visit: Payer: Self-pay

## 2020-09-20 DIAGNOSIS — J449 Chronic obstructive pulmonary disease, unspecified: Secondary | ICD-10-CM

## 2020-09-20 DIAGNOSIS — J189 Pneumonia, unspecified organism: Secondary | ICD-10-CM | POA: Diagnosis not present

## 2020-09-20 DIAGNOSIS — I5042 Chronic combined systolic (congestive) and diastolic (congestive) heart failure: Secondary | ICD-10-CM | POA: Diagnosis not present

## 2020-09-20 DIAGNOSIS — K219 Gastro-esophageal reflux disease without esophagitis: Secondary | ICD-10-CM

## 2020-09-20 IMAGING — DX DG SHOULDER 2+V PORT*R*
1 series · 2 of 2 positions shown · non-contrast
Comparison: Right shoulder x-rays dated October 11, 2017.

CLINICAL DATA: Right shoulder replacement.

EXAM:
PORTABLE RIGHT SHOULDER

[Series 1: shoulder · 0.14mm/px · 2 of 2 slices shown]
[im 1/2]
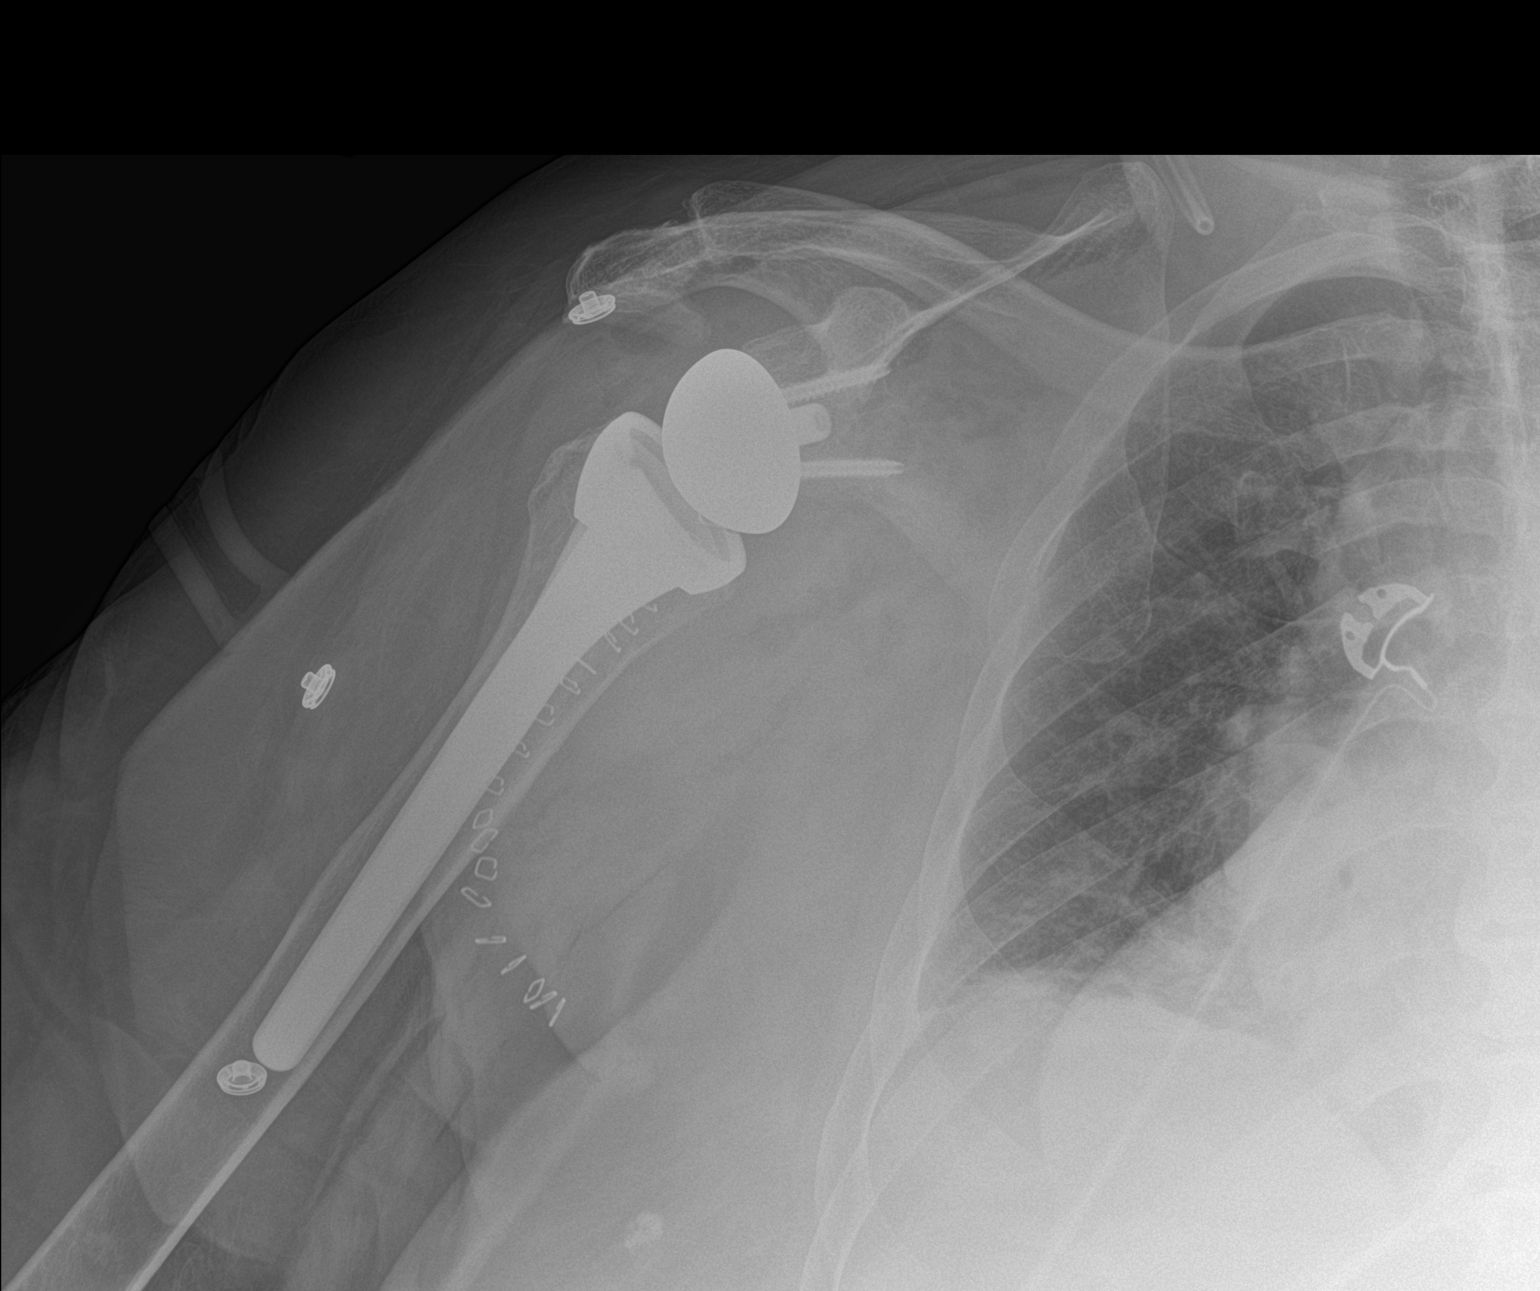
[im 2/2]
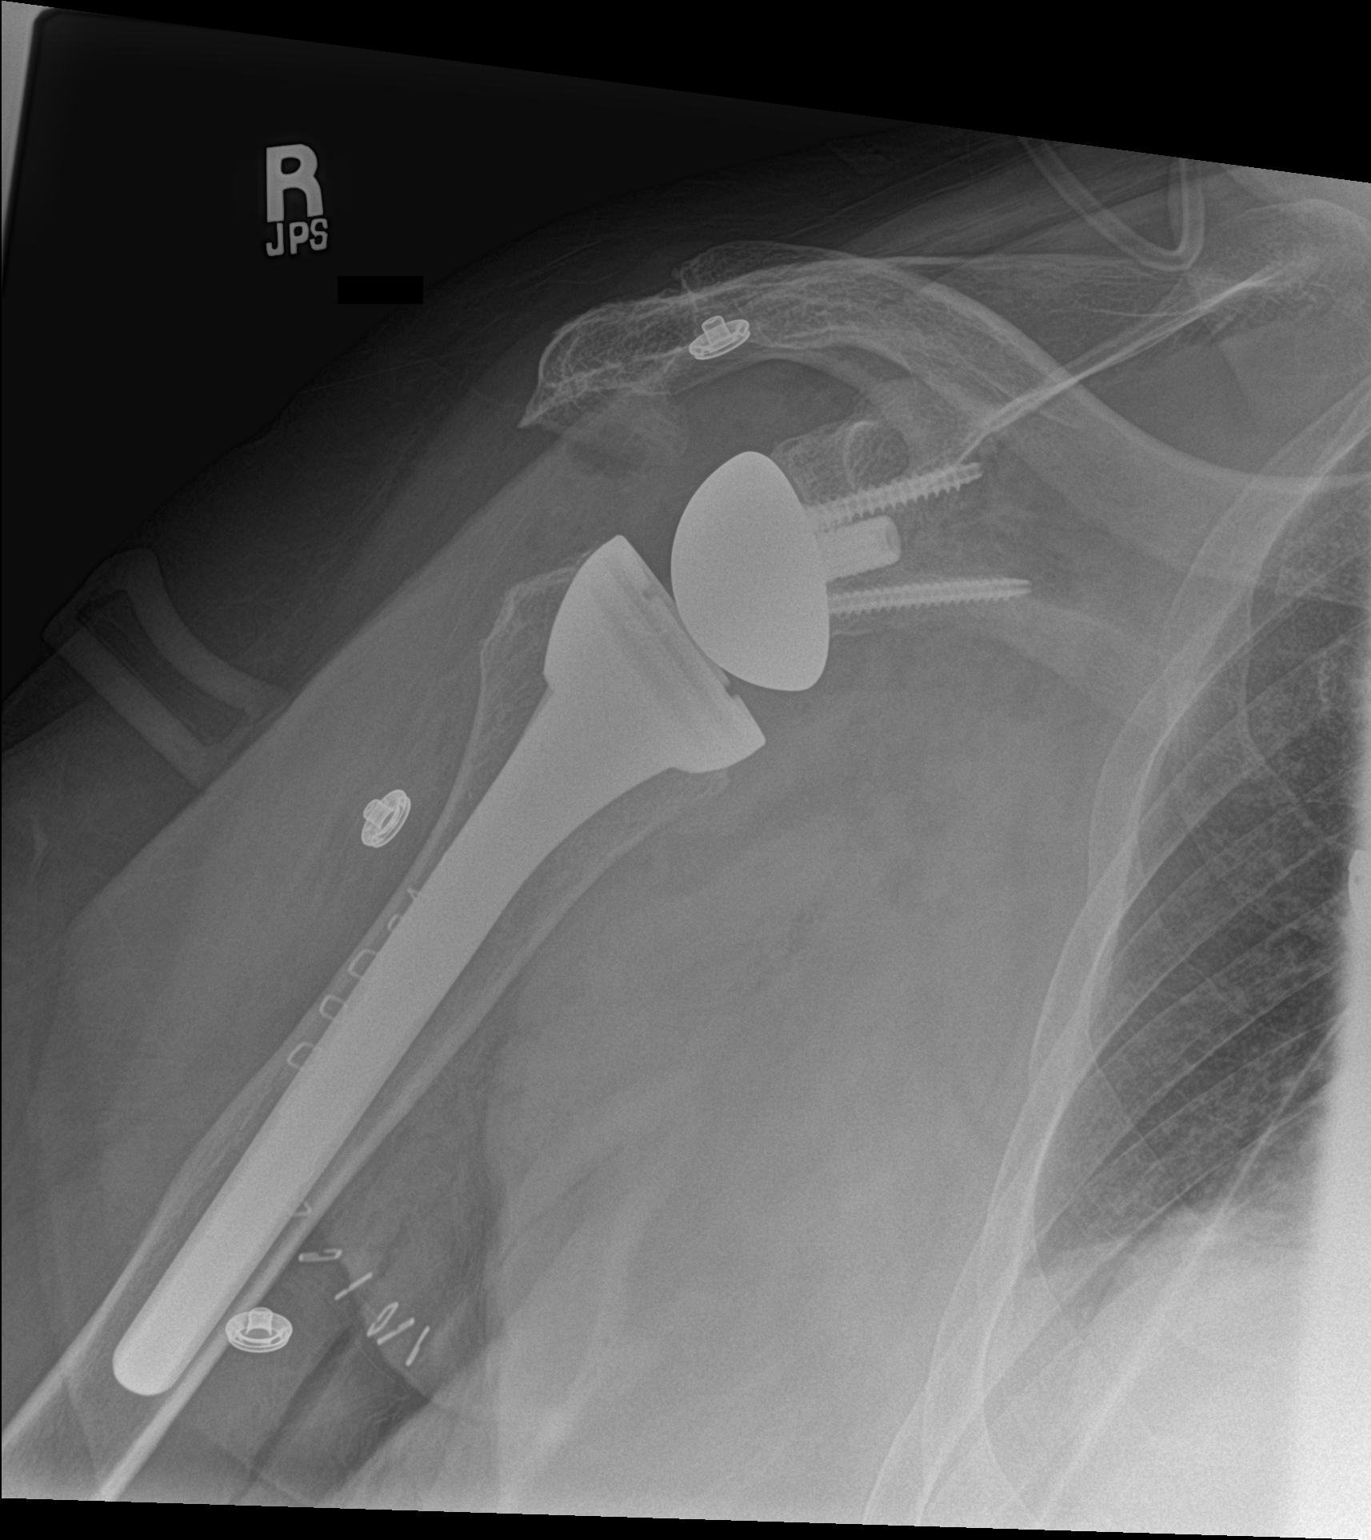

[2 of 2 positions shown; findings below may reference images not displayed]

FINDINGS: Interval reverse total shoulder arthroplasty. No evidence of
hardware failure or loosening. Expected postsurgical changes in the
soft tissues. Unchanged moderate acromioclavicular osteoarthritis.
Right basilar atelectasis.
IMPRESSION: 1. Interval reverse total shoulder arthroplasty without acute
postoperative complication.

## 2020-09-20 MED ORDER — ALBUTEROL SULFATE HFA 108 (90 BASE) MCG/ACT IN AERS: 1.0000 | INHALATION_SPRAY | Freq: Four times a day (QID) | RESPIRATORY_TRACT | 2 refills | Status: DC | PRN

## 2020-09-20 NOTE — Assessment & Plan Note (Signed)
Appears euvolemic on exam.  Continue on current regimen 

## 2020-09-20 NOTE — Assessment & Plan Note (Signed)
Patient's breathing is back to baseline.  May use albuterol as needed.  Continue with trigger prevention.  Plan  Patient Instructions  Albuterol inhaler as needed.  Claritin 10 mg daily as needed Flonase nasal As needed   Activity as tolerated.  Follow up with GI as planned.  Follow-up with Dr. Halford Chessman in 6 months and As needed   Please contact office for sooner follow up if symptoms do not improve or worsen or seek emergency care

## 2020-09-20 NOTE — Assessment & Plan Note (Signed)
Patient has an upcoming GI appointment.  She is continue with follow-up.  Plan  Patient Instructions  Albuterol inhaler as needed.  Claritin 10 mg daily as needed Flonase nasal As needed   Activity as tolerated.  Follow up with GI as planned.  Follow-up with Dr. Halford Chessman in 6 months and As needed   Please contact office for sooner follow up if symptoms do not improve or worsen or seek emergency care

## 2020-09-20 NOTE — Progress Notes (Signed)
$'@Patient'S$  ID: Jillian Eaton, female    DOB: [birth year], 70 y.o.   MRN: [id number]  Chief Complaint  Patient presents with   Follow-up    Referring provider: [name], MD  HPI: 70 year old female never smoker followed for mild intermittent asthma, dyspnea and allergic rhinitis Medical history significant for A. fib, chronic combined congestive heart failure and chronic kidney disease Previous hx of Gastric Bypass   TEST/EVENTS :  PFT 10/19/15 >> FEV1 1.38 (60%), FEV1% 82, TLC 3.34 (60%), DLCO 74%, +BD ABG 07/08/16 >> pH 7.38, PCO2 51.9, PO2 70 PFT 03/11/19 >> FEV1 1.44 (65%), FEV1% 83, TLC 3.97 (72%), DLCO 72%, +BD  Chest imaging:  CT angio chest 12/06/18 >> enlarged PA, CM, mosaic attenuation with GGO RUL CT chest 06/14/19 >> scarring RLL   Sleep tests:  ONO with RA 07/04/19 >> test time 7 hrs 30 min.  Basal SpO2 93.4%, low SpO2 52%.  Spent 18.5 min with SpO2 < 88%.   Cardiac tests:  Echo 06/15/19 >> EF 60 to 65%, RVSP 50.7, mod LA/RA dilation, mod TR  09/20/2020 Follow up : Asthma , Pneumonia  Patient presents for a follow-up visit.  Patient has underlying mild intermittent asthma.  She was recently hospitalized left-sided community-acquired pneumonia and left pleural effusion.  She was treated with IV antibiotics with cefepime, vancomycin azithromycin and ceftriaxone. Initially required oxygen as she was hypoxic on arrival.  Chest x-ray showed left-sided pleural effusion.  CT chest was negative for PE.  Showed a left patchy opacity and small effusion. Since discharge patient says she is feeling much better.  Her activity level is improving.  She has no hemoptysis, fever, chest pain or dyspnea.  Chest x-ray was repeated on September 16, 2020 that showed no sign of acute infiltrate and resolved left basilar infiltrate.  Appetite is good with no nausea vomiting or diarrhea.   Allergies  Allergen Reactions   Oxycodone-Acetaminophen Itching, Nausea And Vomiting, Rash and  Other (See Comments)         Immunization History  Administered Date(s) Administered   Fluad Quad(high Dose 65+) 12/08/2018, 11/21/2019   Influenza Inj Mdck Quad Pf 11/27/2017   Influenza,inj,quad, With Preservative 02/22/2016, 03/05/2017   Influenza-Unspecified 11/19/2014, 03/21/2016, 03/08/2017   Moderna Sars-Covid-2 Vaccination 03/21/2019, 04/18/2019, 12/15/2019, 05/22/2020   Pneumococcal Polysaccharide-23 05/01/2016   Tdap 10/22/2018   Zoster Recombinat (Shingrix) 01/09/2019    Past Medical History:  Diagnosis Date   Anemia    iron and pernicious   Arthritis    Asthma    Cancer ([place])    CHF (congestive heart failure) ([place]) 99991111   Complication of anesthesia    BP dropped    DOE (dyspnea on exertion)    2D ECHO, 02/12/2012 - EF 60-65%, moderate concentric hypertrophy   Dyspnea    Dysrhythmia    a-fib   Fibromyalgia    nerve pain"left side at waist level" "can't lay on that side without pain" , "HOB elevation helps"; pt. thinks this has resolved after 200lb weight loss9-17- since i lost the wieght , i dont  think i have this anymore    Headache 04/2019   Heart murmur    Hematuria - cause not known    resolved    History of COVID-19    History of kidney stones    x 2 '13, '14 surgery to remove   Hypertension    Pneumonia    aspiration  04/2016   Presence of permanent cardiac pacemaker  SBO (small bowel obstruction) ([place])    rqueired admission 2020   Thyroid disease    "goiter"   Transfusion history    10 yrs+    Tobacco History: Social History   Tobacco Use  Smoking Status Never  Smokeless Tobacco Never   Counseling given: Not Answered   Outpatient Medications Prior to Visit  Medication Sig Dispense Refill   acetaminophen (TYLENOL) 650 MG CR tablet Take 1,300 mg by mouth every 8 (eight) hours as needed for pain.     apixaban (ELIQUIS) 5 MG TABS tablet Take 1 tablet (5 mg total) by mouth 2 (two) times daily. 60 tablet 0   calcitRIOL (ROCALTROL) 0.25  MCG capsule Take 1 capsule (0.25 mcg total) by mouth 2 (two) times daily. 180 capsule 3   calcium elemental as carbonate (TUMS ULTRA 1000) 400 MG chewable tablet Chew 1,000 mg by mouth 2 (two) times daily.     cyanocobalamin (,VITAMIN B-12,) 1000 MCG/ML injection Inject 1 mL (1,000 mcg total) into the muscle every 30 (thirty) days. 1 mL 0   Docusate Sodium (DSS) 100 MG CAPS Take 100 mg by mouth every morning.     eszopiclone (LUNESTA) 2 MG TABS tablet Take 1 tablet (2 mg total) by mouth at bedtime. 20 tablet 0   furosemide (LASIX) 20 MG tablet Take 20 mg by mouth daily as needed for edema.     levothyroxine (SYNTHROID) 175 MCG tablet Take 1 tablet (175 mcg total) by mouth daily. 45 tablet 3   Magnesium 400 MG TABS Take 400 mg by mouth every morning.     Multiple Vitamins-Minerals (MULTIVITAMIN WITH MINERALS) tablet Take 1 tablet by mouth daily. bariatric     Potassium 99 MG TABS Take 99 mg by mouth every morning.     traZODone (DESYREL) 50 MG tablet Take 50 mg by mouth at bedtime.     VITAMIN A PO Take 1 capsule by mouth every morning.     Vitamin D, Ergocalciferol, (DRISDOL) 1.25 MG (50000 UT) CAPS capsule Take 1 capsule (50,000 Units total) by mouth every Wednesday. 4 capsule 0   HYDROcodone-acetaminophen (NORCO) 10-325 MG tablet Take 1 tablet by mouth 3 (three) times daily as needed (pain).     No facility-administered medications prior to visit.     Review of Systems:   Constitutional:   No  weight loss, night sweats,  Fevers, chills,  +fatigue, or  lassitude.  HEENT:   No headaches,  Difficulty swallowing,  Tooth/dental problems, or  Sore throat,                No sneezing, itching, ear ache, nasal congestion, post nasal drip,   CV:  No chest pain,  Orthopnea, PND, swelling in lower extremities, anasarca, dizziness, palpitations, syncope.   GI  No heartburn, indigestion, abdominal pain, nausea, vomiting, diarrhea, change in bowel habits, loss of appetite, bloody stools.   Resp: .   No chest wall deformity  Skin: no rash or lesions.  GU: no dysuria, change in color of urine, no urgency or frequency.  No flank pain, no hematuria   MS:  No joint pain or swelling.  No decreased range of motion.  No back pain.    Physical Exam  BP 104/68 (BP Location: Left Arm, Patient Position: Sitting, Cuff Size: Normal)   Pulse 80   Temp 97.7 F (36.5 C) (Oral)   Ht '5\' 7"'$  (1.702 m)   Wt 174 lb 12.8 oz (79.3 kg)   SpO2 98%  BMI 27.38 kg/m   GEN: A/Ox3; pleasant , NAD, well nourished    HEENT:  [place]/AT,   NOSE-clear, THROAT-clear, no lesions, no postnasal drip or exudate noted.   NECK:  Supple w/ fair ROM; no JVD; normal carotid impulses w/o bruits; no thyromegaly or nodules palpated; no lymphadenopathy.    RESP  Clear  P & A; w/o, wheezes/ rales/ or rhonchi. no accessory muscle use, no dullness to percussion  CARD:  RRR, no m/r/g, no peripheral edema, pulses intact, no cyanosis or clubbing.  GI:   Soft & nt; nml bowel sounds; no organomegaly or masses detected.   Musco: Warm bil, no deformities or joint swelling noted.   Neuro: alert, no focal deficits noted.    Skin: Warm, no lesions or rashes    Lab Results:        Imaging: DG Chest 2 View  Result Date: 09/16/2020 CLINICAL DATA:  Community-acquired pneumonia.  Recheck. EXAM: CHEST - 2 VIEW COMPARISON:  CT chest 08/22/2020.  Chest x-ray 08/21/2020. FINDINGS: AICD in stable position. Stable cardiomegaly. No focal infiltrate. Previously identified left base infiltrate has cleared. No pleural effusion or pneumothorax. Surgical clips and sutures upper abdomen. Total right shoulder replacement. Degenerative changes left shoulder and thoracic spine. IMPRESSION: 1.  Interim clearing of left base infiltrate. 2.  AICD in stable position.  Stable cardiomegaly. Electronically Signed   By: Marcello Moores  Register   On: 09/16/2020 14:13   CT CHEST WO CONTRAST  Result Date: 08/22/2020 CLINICAL DATA:  Possible pleural effusion  versus mass EXAM: CT CHEST WITHOUT CONTRAST TECHNIQUE: Multidetector CT imaging of the chest was performed following the standard protocol without IV contrast. COMPARISON:  Chest x-ray 08/21/2020; CT scan of the chest 04/10/2020 FINDINGS: Cardiovascular: Limited evaluation in the absence of intravenous contrast. Cardiomegaly with marked right atrial enlargement. Left subclavian approach cardiac rhythm maintenance device with ICD. Tip terminates at the interventricular septum. Marked enlargement of the main pulmonary artery. No pericardial effusion. Mediastinum/Nodes: Unremarkable CT appearance of the thyroid gland. No suspicious mediastinal or hilar adenopathy. No soft tissue mediastinal mass. The thoracic esophagus is unremarkable. Lungs/Pleura: Patchy airspace opacities throughout the left lower lobe consistent with pneumonia. There is a small associated pleural effusion and associated compressive atelectasis. Diffuse mild bronchial wall thickening. The remaining lungs are clear. No suspicious mass or nodule. Upper Abdomen: No acute abnormality. Evidence of prior gastric and gallbladder surgery. Musculoskeletal: No acute fracture or aggressive appearing lytic or blastic osseous lesion. IMPRESSION: 1. Imaging findings are most consistent with left lower lobe pneumonia with small likely parapneumonic effusion. 2. Enlarged main pulmonary artery as can be seen with pulmonary arterial hypertension. 3. Cardiomegaly with marked right atrial enlargement. Electronically Signed   By: Jacqulynn Cadet M.D.   On: 08/22/2020 10:19   CUP PACEART REMOTE DEVICE CHECK  Result Date: 09/18/2020 Scheduled remote reviewed. Normal device function.  Next remote 91 days.   denosumab (PROLIA) injection 60 mg     Date Action Dose Route User   09/03/2020 1056 Given 60 mg Subcutaneous (Right Arm) [place], Tileshia, RMA       PFT Results Latest Ref Rng & Units 03/11/2019 10/19/2015  FVC-Pre L 1.73 1.54  FVC-Predicted Pre % 61 52   FVC-Post L 1.74 1.68  FVC-Predicted Post % 61 57  Pre FEV1/FVC % % 70 74  Post FEV1/FCV % % 83 82  FEV1-Pre L 1.21 1.14  FEV1-Predicted Pre % 54 50  FEV1-Post L 1.44 1.38  DLCO uncorrected ml/min/mmHg 15.87 21.15  DLCO UNC% % 72 74  DLCO corrected ml/min/mmHg - 20.22  DLCO COR %Predicted % - 71  DLVA Predicted % 105 109  TLC L 3.97 -  TLC % Predicted % 72 -  RV % Predicted % 92 -    No results found for: NITRICOXIDE      Assessment & Plan:   Community acquired pneumonia Left-sided community-acquired pneumonia with small effusion.   Patient required hospitalization, patient is clinically improved with antibiotics.  Chest x-ray on September 16, 2020 shows clearance of left-sided pneumonia.  No further imaging is indicated at this time  Plan  Patient Instructions  Albuterol inhaler as needed.  Claritin 10 mg daily as needed Flonase nasal As needed   Activity as tolerated.  Follow up with GI as planned.  Follow-up with Dr. Halford Chessman in 6 months and As needed   Please contact office for sooner follow up if symptoms do not improve or worsen or seek emergency care       Chronic combined systolic and diastolic congestive heart failure ([place]) Appears euvolemic on exam.  Continue on current regimen  Chronic asthmatic bronchitis ([place]) Patient's breathing is back to baseline.  May use albuterol as needed.  Continue with trigger prevention.  Plan  Patient Instructions  Albuterol inhaler as needed.  Claritin 10 mg daily as needed Flonase nasal As needed   Activity as tolerated.  Follow up with GI as planned.  Follow-up with Dr. Halford Chessman in 6 months and As needed   Please contact office for sooner follow up if symptoms do not improve or worsen or seek emergency care        GERD (gastroesophageal reflux disease) Patient has an upcoming GI appointment.  She is continue with follow-up.  Plan  Patient Instructions  Albuterol inhaler as needed.  Claritin 10 mg daily as  needed Flonase nasal As needed   Activity as tolerated.  Follow up with GI as planned.  Follow-up with Dr. Halford Chessman in 6 months and As needed   Please contact office for sooner follow up if symptoms do not improve or worsen or seek emergency care          Rexene Edison, NP 09/20/2020

## 2020-09-20 NOTE — Assessment & Plan Note (Addendum)
Left-sided community-acquired pneumonia with small effusion.   Patient required hospitalization, patient is clinically improved with antibiotics.  Chest x-ray on September 16, 2020 shows clearance of left-sided pneumonia.  No further imaging is indicated at this time  Plan  Patient Instructions  Albuterol inhaler as needed.  Claritin 10 mg daily as needed Flonase nasal As needed   Activity as tolerated.  Follow up with GI as planned.  Follow-up with Dr. Halford Chessman in 6 months and As needed   Please contact office for sooner follow up if symptoms do not improve or worsen or seek emergency care

## 2020-09-20 NOTE — Patient Instructions (Addendum)
Albuterol inhaler as needed.  Claritin 10 mg daily as needed Flonase nasal As needed   Activity as tolerated.  Follow up with GI as planned.  Follow-up with Dr. Halford Chessman in 6 months and As needed   Please contact office for sooner follow up if symptoms do not improve or worsen or seek emergency care

## 2020-09-27 DIAGNOSIS — E042 Nontoxic multinodular goiter: Secondary | ICD-10-CM | POA: Diagnosis not present

## 2020-09-27 DIAGNOSIS — D509 Iron deficiency anemia, unspecified: Secondary | ICD-10-CM | POA: Diagnosis not present

## 2020-09-27 DIAGNOSIS — G43009 Migraine without aura, not intractable, without status migrainosus: Secondary | ICD-10-CM | POA: Diagnosis not present

## 2020-09-27 DIAGNOSIS — I5042 Chronic combined systolic (congestive) and diastolic (congestive) heart failure: Secondary | ICD-10-CM | POA: Diagnosis not present

## 2020-09-27 DIAGNOSIS — E039 Hypothyroidism, unspecified: Secondary | ICD-10-CM | POA: Diagnosis not present

## 2020-09-27 DIAGNOSIS — I272 Pulmonary hypertension, unspecified: Secondary | ICD-10-CM | POA: Diagnosis not present

## 2020-09-27 DIAGNOSIS — Z980 Intestinal bypass and anastomosis status: Secondary | ICD-10-CM | POA: Diagnosis not present

## 2020-09-27 DIAGNOSIS — Z79899 Other long term (current) drug therapy: Secondary | ICD-10-CM | POA: Diagnosis not present

## 2020-09-27 DIAGNOSIS — N1831 Chronic kidney disease, stage 3a: Secondary | ICD-10-CM | POA: Diagnosis not present

## 2020-09-27 DIAGNOSIS — R933 Abnormal findings on diagnostic imaging of other parts of digestive tract: Secondary | ICD-10-CM | POA: Diagnosis not present

## 2020-09-27 DIAGNOSIS — I480 Paroxysmal atrial fibrillation: Secondary | ICD-10-CM | POA: Diagnosis not present

## 2020-09-27 DIAGNOSIS — G8929 Other chronic pain: Secondary | ICD-10-CM | POA: Diagnosis not present

## 2020-09-27 DIAGNOSIS — K317 Polyp of stomach and duodenum: Secondary | ICD-10-CM | POA: Diagnosis not present

## 2020-09-27 DIAGNOSIS — M797 Fibromyalgia: Secondary | ICD-10-CM | POA: Diagnosis not present

## 2020-09-27 DIAGNOSIS — I428 Other cardiomyopathies: Secondary | ICD-10-CM | POA: Diagnosis not present

## 2020-09-27 DIAGNOSIS — I130 Hypertensive heart and chronic kidney disease with heart failure and stage 1 through stage 4 chronic kidney disease, or unspecified chronic kidney disease: Secondary | ICD-10-CM | POA: Diagnosis not present

## 2020-09-27 DIAGNOSIS — Z7901 Long term (current) use of anticoagulants: Secondary | ICD-10-CM | POA: Diagnosis not present

## 2020-09-27 DIAGNOSIS — K635 Polyp of colon: Secondary | ICD-10-CM | POA: Diagnosis not present

## 2020-09-27 DIAGNOSIS — Z9884 Bariatric surgery status: Secondary | ICD-10-CM | POA: Diagnosis not present

## 2020-10-01 DIAGNOSIS — M47816 Spondylosis without myelopathy or radiculopathy, lumbar region: Secondary | ICD-10-CM | POA: Diagnosis not present

## 2020-10-01 DIAGNOSIS — M47896 Other spondylosis, lumbar region: Secondary | ICD-10-CM | POA: Diagnosis not present

## 2020-10-04 ENCOUNTER — Other Ambulatory Visit: Payer: Self-pay

## 2020-10-04 ENCOUNTER — Ambulatory Visit (INDEPENDENT_AMBULATORY_CARE_PROVIDER_SITE_OTHER): Payer: Medicare Other | Admitting: Podiatry

## 2020-10-04 ENCOUNTER — Encounter: Payer: Self-pay | Admitting: Podiatry

## 2020-10-04 DIAGNOSIS — B351 Tinea unguium: Secondary | ICD-10-CM | POA: Diagnosis not present

## 2020-10-04 DIAGNOSIS — M79674 Pain in right toe(s): Secondary | ICD-10-CM | POA: Diagnosis not present

## 2020-10-04 DIAGNOSIS — M79675 Pain in left toe(s): Secondary | ICD-10-CM

## 2020-10-04 DIAGNOSIS — L84 Corns and callosities: Secondary | ICD-10-CM | POA: Diagnosis not present

## 2020-10-04 DIAGNOSIS — D510 Vitamin B12 deficiency anemia due to intrinsic factor deficiency: Secondary | ICD-10-CM

## 2020-10-04 DIAGNOSIS — R109 Unspecified abdominal pain: Secondary | ICD-10-CM | POA: Insufficient documentation

## 2020-10-04 DIAGNOSIS — R0603 Acute respiratory distress: Secondary | ICD-10-CM | POA: Insufficient documentation

## 2020-10-04 DIAGNOSIS — R7989 Other specified abnormal findings of blood chemistry: Secondary | ICD-10-CM | POA: Insufficient documentation

## 2020-10-06 ENCOUNTER — Ambulatory Visit: Payer: Medicare Other | Admitting: Rheumatology

## 2020-10-06 DIAGNOSIS — R933 Abnormal findings on diagnostic imaging of other parts of digestive tract: Secondary | ICD-10-CM | POA: Diagnosis not present

## 2020-10-06 DIAGNOSIS — D132 Benign neoplasm of duodenum: Secondary | ICD-10-CM | POA: Diagnosis not present

## 2020-10-06 DIAGNOSIS — R1084 Generalized abdominal pain: Secondary | ICD-10-CM | POA: Diagnosis not present

## 2020-10-08 DIAGNOSIS — R109 Unspecified abdominal pain: Secondary | ICD-10-CM | POA: Diagnosis not present

## 2020-10-08 DIAGNOSIS — R933 Abnormal findings on diagnostic imaging of other parts of digestive tract: Secondary | ICD-10-CM | POA: Diagnosis not present

## 2020-10-08 DIAGNOSIS — K639 Disease of intestine, unspecified: Secondary | ICD-10-CM | POA: Diagnosis not present

## 2020-10-08 DIAGNOSIS — Z9884 Bariatric surgery status: Secondary | ICD-10-CM | POA: Diagnosis not present

## 2020-10-08 DIAGNOSIS — Z8719 Personal history of other diseases of the digestive system: Secondary | ICD-10-CM | POA: Diagnosis not present

## 2020-10-08 NOTE — Progress Notes (Signed)
Subjective: Jillian Eaton is a pleasant 70 y.o. female  with h/o pernicious anemia. She is seen for painful cornpatient seen today painful thick toenails that are difficult to trim. Pain interferes with ambulation. Aggravating factors include wearing enclosed shoe gear. Pain is relieved with periodic professional debridement.  PCP is [name], MD. Last visit was: July, 2022.  She states she and her husband will be relocating to [place] at the end of September.  Allergies  Allergen Reactions   Oxycodone-Acetaminophen Itching, Nausea And Vomiting, Rash and Other (See Comments)       Objective: Physical Exam  General: Jillian Eaton is a pleasant 70 y.o. African American female, in NAD. AAO x 3.   Vascular:  Capillary refill time to digits immediate b/l. Palpable DP pulse(s) b/l lower extremities Palpable PT pulse(s) b/l lower extremities Pedal hair absent. Lower extremity skin temperature gradient within normal limits. No pain with calf compression b/l.  Dermatological:  Skin warm and supple b/l lower extremities. No open wounds b/l lower extremities. Toenails 1-5 b/l elongated, discolored, dystrophic, thickened, crumbly with subungual debris and tenderness to dorsal palpation. Hyperkeratotic lesion(s) L 5th toe and R 5th toe.  No erythema, no edema, no drainage, no fluctuance.  Musculoskeletal:  Normal muscle strength 5/5 to all lower extremity muscle groups bilaterally. Hammertoe(s) noted to the L 5th toe and R 5th toe.  Neurological:  Protective sensation intact 5/5 intact bilaterally with 10g monofilament b/l.  Assessment and Plan:  1. Pain due to onychomycosis of toenails of both feet   2. Corns   3. Pernicious anemia      -Examined patient. -Patient to continue soft, supportive shoe gear daily. -Toenails 1-5 b/l were debrided in length and girth with sterile nail nippers and dremel without iatrogenic bleeding.  -Corn(s) L 5th toe and R 5th toe  pared utilizing sterile scalpel blade without complication or incident. Total number debrided=2. -Patient to report any pedal injuries to medical professional immediately. -It has been an honor being her Podiatrist. Patient was given a business card should she ever need services.  No follow-ups on file.  Marzetta Board, DPM

## 2020-10-13 NOTE — Progress Notes (Signed)
Remote pacemaker transmission.   

## 2020-10-14 ENCOUNTER — Other Ambulatory Visit: Payer: Self-pay

## 2020-10-14 ENCOUNTER — Ambulatory Visit (INDEPENDENT_AMBULATORY_CARE_PROVIDER_SITE_OTHER): Payer: Medicare Other | Admitting: Internal Medicine

## 2020-10-14 VITALS — BP 126/80 | HR 85 | Ht 67.0 in | Wt 181.6 lb

## 2020-10-14 DIAGNOSIS — I482 Chronic atrial fibrillation, unspecified: Secondary | ICD-10-CM | POA: Diagnosis not present

## 2020-10-14 DIAGNOSIS — Z950 Presence of cardiac pacemaker: Secondary | ICD-10-CM | POA: Diagnosis not present

## 2020-10-14 DIAGNOSIS — I442 Atrioventricular block, complete: Secondary | ICD-10-CM

## 2020-10-14 NOTE — Patient Instructions (Signed)
Medication Instructions:  Your physician recommends that you continue on your current medications as directed. Please refer to the Current Medication list given to you today.  *If you need a refill on your cardiac medications before your next appointment, please call your pharmacy*   Lab Work: None ordered.  If you have labs (blood work) drawn today and your tests are completely normal, you will receive your results only by: [place] (if you have MyChart) OR A paper copy in the mail If you have any lab test that is abnormal or we need to change your treatment, we will call you to review the results.   Testing/Procedures: None ordered.    Follow-Up: At [facility], you and your health needs are our priority.  As part of our continuing mission to provide you with exceptional heart care, we have created designated Provider Care Teams.  These Care Teams include your primary Cardiologist (physician) and Advanced Practice Providers (APPs -  Physician Assistants and Nurse Practitioners) who all work together to provide you with the care you need, when you need it.  We recommend signing up for the patient portal called "MyChart".  Sign up information is provided on this After Visit Summary.  MyChart is used to connect with patients for Virtual Visits (Telemedicine).  Patients are able to view lab/test results, encounter notes, upcoming appointments, etc.  Non-urgent messages can be sent to your provider as well.   To learn more about what you can do with MyChart, go to [email or web address].    Your next appointment:   12 months with Dr Lovena Le

## 2020-10-14 NOTE — Progress Notes (Signed)
HPI Jillian Eaton returns today for followup. She is a pleasant 70 yo woman with uncontrolled atrial fib who underwent AV node ablation and PPM insertion about 16 months ago. She had done well except that she developed pneumonia. She was found to have vitamin deficiency. She is on bariatric vitamins. She remains on systemic anti-coagulation and has not had any bleeding. She has lost over 200 lbs after bariatric surgery. Allergies  Allergen Reactions   Oxycodone-Acetaminophen Itching, Nausea And Vomiting, Rash and Other (See Comments)          Current Outpatient Medications  Medication Sig Dispense Refill   acetaminophen (TYLENOL) 650 MG CR tablet Take 1,300 mg by mouth every 8 (eight) hours as needed for pain.     apixaban (ELIQUIS) 5 MG TABS tablet Take 1 tablet (5 mg total) by mouth 2 (two) times daily. 60 tablet 0   calcitRIOL (ROCALTROL) 0.25 MCG capsule Take 1 capsule (0.25 mcg total) by mouth 2 (two) times daily. 180 capsule 3   calcium elemental as carbonate (TUMS ULTRA 1000) 400 MG chewable tablet Chew 1,000 mg by mouth 2 (two) times daily.     cyanocobalamin (,VITAMIN B-12,) 1000 MCG/ML injection Inject 1 mL (1,000 mcg total) into the muscle every 30 (thirty) days. 1 mL 0   Docusate Sodium (DSS) 100 MG CAPS Take 100 mg by mouth every morning.     eszopiclone (LUNESTA) 2 MG TABS tablet Take 1 tablet (2 mg total) by mouth at bedtime. 20 tablet 0   furosemide (LASIX) 20 MG tablet Take 20 mg by mouth daily as needed for edema.     levothyroxine (SYNTHROID) 175 MCG tablet Take 1 tablet (175 mcg total) by mouth daily. 45 tablet 3   Magnesium 400 MG TABS Take 400 mg by mouth every morning.     Multiple Vitamins-Minerals (MULTIVITAMIN WITH MINERALS) tablet Take 1 tablet by mouth daily. bariatric     Potassium 99 MG TABS Take 99 mg by mouth every morning.     traZODone (DESYREL) 50 MG tablet Take 50 mg by mouth at bedtime.     VITAMIN A PO Take 1 capsule by mouth every morning.      Vitamin D, Ergocalciferol, (DRISDOL) 1.25 MG (50000 UT) CAPS capsule Take 1 capsule (50,000 Units total) by mouth every Wednesday. 4 capsule 0   No current facility-administered medications for this visit.     Past Medical History:  Diagnosis Date   Anemia    iron and pernicious   Arthritis    Asthma    Cancer (HCC)    CHF (congestive heart failure) ([place]) 99991111   Complication of anesthesia    BP dropped    DOE (dyspnea on exertion)    2D ECHO, 02/12/2012 - EF 60-65%, moderate concentric hypertrophy   Dyspnea    Dysrhythmia    a-fib   Fibromyalgia    nerve pain"left side at waist level" "can't lay on that side without pain" , "HOB elevation helps"; pt. thinks this has resolved after 200lb weight loss9-17- since i lost the wieght , i dont  think i have this anymore    Headache 04/2019   Heart murmur    Hematuria - cause not known    resolved    History of COVID-19    History of kidney stones    x 2 '13, '14 surgery to remove   Hypertension    Pneumonia    aspiration  04/2016   Presence of permanent  cardiac pacemaker    SBO (small bowel obstruction) ([place])    rqueired admission 2020   Thyroid disease    "goiter"   Transfusion history    10 yrs+    ROS:   All systems reviewed and negative except as noted in the HPI.   Past Surgical History:  Procedure Laterality Date   ABDOMINAL ADHESION SURGERY  2004   open LOA - in [place]   AV NODE ABLATION N/A 06/20/2019   Procedure: AV NODE ABLATION;  Surgeon: Evans Lance, MD;  Location: [place] [facility];  Service: Cardiovascular;  Laterality: N/A;   BALLOON DILATION N/A 07/25/2019   Procedure: BALLOON DILATION;  Surgeon: Carol Ada, MD;  Location: [facility];  Service: Endoscopy;  Laterality: N/A;   CARDIAC CATHETERIZATION  04/04/2010   No significant obstructive coronary artery disease   CARDIOVERSION N/A 08/08/2017   Procedure: CARDIOVERSION;  Surgeon: Pixie Casino, MD;  Location: [facility];  Service: Cardiovascular;   Laterality: N/A;   CARDIOVERSION N/A 06/18/2019   Procedure: CARDIOVERSION;  Surgeon: Pixie Casino, MD;  Location: [facility];  Service: Cardiovascular;  Laterality: N/A;   CHOLECYSTECTOMY  1990   COLONOSCOPY W/ POLYPECTOMY     COLONOSCOPY WITH PROPOFOL N/A 04/10/2014   Procedure: COLONOSCOPY WITH PROPOFOL;  Surgeon: Beryle Beams, MD;  Location: [facility];  Service: Endoscopy;  Laterality: N/A;   CYSTOSCOPY/URETEROSCOPY/HOLMIUM LASER/STENT PLACEMENT Right 08/13/2019   Procedure: CYSTOSCOPY/RETROGRADE/URETEROSCOPY/HOLMIUM LASER/STENT PLACEMENT;  Surgeon: Ceasar Mons, MD;  Location: [facility];  Service: Urology;  Laterality: Right;  ONLY NEEDS 45 MIN   ENTEROSCOPY N/A 08/13/2020   Procedure: ENTEROSCOPY;  Surgeon: Carol Ada, MD;  Location: [facility];  Service: Endoscopy;  Laterality: N/A;   ESOPHAGOGASTRODUODENOSCOPY (EGD) WITH PROPOFOL N/A 07/25/2019   Procedure: ESOPHAGOGASTRODUODENOSCOPY (EGD) WITH PROPOFOL;  Surgeon: Carol Ada, MD;  [facility];  Service: Endoscopy;  Laterality: N/A;   EYE SURGERY     lasik 20-25 yrs ago   GASTROPLASTY VERTICAL BANDED  [place]   In [place] for SBO   [place]  07/25/2019   Procedure: MALONEY DILATION;  Surgeon: Carol Ada, MD;  Location: [facility];  Service: Endoscopy;;   PACEMAKER IMPLANT N/A 06/20/2019   Procedure: PACEMAKER IMPLANT;  Surgeon: Evans Lance, MD;  Location: [place] [facility];  Service: Cardiovascular;  Laterality: N/A;   REVERSE SHOULDER ARTHROPLASTY Right 03/29/2018   Procedure: REVERSE SHOULDER ARTHROPLASTY;  Surgeon: Netta Cedars, MD;  Location: [facility];  Service: Orthopedics;  Laterality: Right;   RIGHT/LEFT HEART CATH AND CORONARY ANGIOGRAPHY N/A 04/23/2020   Procedure: RIGHT/LEFT HEART CATH AND CORONARY ANGIOGRAPHY;  Surgeon: Jolaine Artist, MD;  Location: [place] [facility];  Service: Cardiovascular;  Laterality: N/A;   ROUX-EN-Y  GASTRIC BYPASS  1992   Conversion VBG to RnYGB in [place], [place]   SHOULDER ARTHROSCOPY WITH SUBACROMIAL DECOMPRESSION  2016   Dr Jannett Celestine, [place]   SMALL INTESTINE SURGERY  2000   SBO - LOA w SB resection   TOTAL KNEE ARTHROPLASTY Left 11/14/2018   Procedure: TOTAL KNEE ARTHROPLASTY;  Surgeon: Paralee Cancel, MD;  Location: [facility];  Service: Orthopedics;  Laterality: Left;  70 mins   TOTAL KNEE ARTHROPLASTY Right 04/29/2019   Procedure: TOTAL KNEE ARTHROPLASTY;  Surgeon: Paralee Cancel, MD;  Location: [facility];  Service: Orthopedics;  Laterality: Right;  70 mins   TOTAL THYROIDECTOMY  06/26/2012   Dr Celine Ahr, [facility]   TUBAL LIGATION  1986   URETHRAL DILATION  2012   w cystoscopy.  Dr Gaynelle Arabian   UTERINE FIBROID EMBOLIZATION       Family History  Problem Relation Age of Onset   Diabetes Mother    Epilepsy Mother    Cancer Mother        Breast   Hypertension Mother    Breast cancer Mother    Seizures Mother    Kidney disease Father    Diabetes Father    Hypertension Father    Asthma Father    Heart disease Father    Epilepsy Sister    Cancer Maternal Grandmother    Breast cancer Maternal Grandmother    Cancer Paternal Grandmother    Breast cancer Paternal Grandmother      Social History   Socioeconomic History   Marital status: Married    Spouse name: Not on file   Number of children: 3   Years of education: Not on file   Highest education level: Master's degree (e.g., MA, MS, MEng, MEd, MSW, MBA)  Occupational History   Occupation: retired  Tobacco Use   Smoking status: Never   Smokeless tobacco: Never  Vaping Use   Vaping Use: Never used  Substance and Sexual Activity   Alcohol use: No    Comment: wine occ   Drug use: No    Types: Oxycodone    Comment: perscribed   Sexual activity: Not Currently    Birth control/protection: None  Other Topics Concern   Not on file  Social History Narrative   Right handed   1 cup of caffeine per day         Social Determinants of Health   Financial Resource Strain: Not on file  Food Insecurity: Not on file  Transportation Needs: Not on file  Physical Activity: Not on file  Stress: Not on file  Social Connections: Not on file  Intimate Partner Violence: Not on file     BP 126/80   Pulse 85   Ht '5\' 7"'$  (1.702 m)   Wt 181 lb 9.6 oz (82.4 kg)   SpO2 99%   BMI 28.44 kg/m   Physical Exam:  Well appearing NAD HEENT: Unremarkable Neck:  No JVD, no thyromegally Lymphatics:  No adenopathy Back:  No CVA tenderness Lungs:  Clear with no wheezes HEART:  Regular rate rhythm, no murmurs, no rubs, no clicks Abd:  soft, positive bowel sounds, no organomegally, no rebound, no guarding Ext:  2 plus pulses, no edema, no cyanosis, no clubbing Skin:  No rashes no nodules Neuro:  CN II through XII intact, motor grossly intact  DEVICE  Normal device function.  See PaceArt for details.   Assess/Plan:  Uncontrolled atrial fib - Her VR is well controlled s/p AV node ablation. PPM - her Medtronic single chamber PPM is working normally.  Obesity - she has gone from over 400 lbs to 180.  Coags - she has not had any bleeding on Eliquis. Continue.  Jillian Overlie Janielle Mittelstadt,MD

## 2020-10-19 DIAGNOSIS — I509 Heart failure, unspecified: Secondary | ICD-10-CM | POA: Diagnosis not present

## 2020-10-19 DIAGNOSIS — N200 Calculus of kidney: Secondary | ICD-10-CM | POA: Diagnosis not present

## 2020-10-19 DIAGNOSIS — I129 Hypertensive chronic kidney disease with stage 1 through stage 4 chronic kidney disease, or unspecified chronic kidney disease: Secondary | ICD-10-CM | POA: Diagnosis not present

## 2020-10-19 DIAGNOSIS — N182 Chronic kidney disease, stage 2 (mild): Secondary | ICD-10-CM | POA: Diagnosis not present

## 2020-10-19 DIAGNOSIS — R319 Hematuria, unspecified: Secondary | ICD-10-CM | POA: Diagnosis not present

## 2020-10-19 DIAGNOSIS — D649 Anemia, unspecified: Secondary | ICD-10-CM | POA: Diagnosis not present

## 2020-10-20 DIAGNOSIS — E890 Postprocedural hypothyroidism: Secondary | ICD-10-CM | POA: Diagnosis not present

## 2020-10-20 DIAGNOSIS — R1909 Other intra-abdominal and pelvic swelling, mass and lump: Secondary | ICD-10-CM | POA: Diagnosis not present

## 2020-10-20 DIAGNOSIS — Z1211 Encounter for screening for malignant neoplasm of colon: Secondary | ICD-10-CM | POA: Diagnosis not present

## 2020-10-20 DIAGNOSIS — Z13820 Encounter for screening for osteoporosis: Secondary | ICD-10-CM | POA: Diagnosis not present

## 2020-10-20 DIAGNOSIS — D649 Anemia, unspecified: Secondary | ICD-10-CM | POA: Diagnosis not present

## 2020-10-20 DIAGNOSIS — Z01419 Encounter for gynecological examination (general) (routine) without abnormal findings: Secondary | ICD-10-CM | POA: Diagnosis not present

## 2020-10-20 DIAGNOSIS — Z1231 Encounter for screening mammogram for malignant neoplasm of breast: Secondary | ICD-10-CM | POA: Diagnosis not present

## 2020-10-20 DIAGNOSIS — E509 Vitamin A deficiency, unspecified: Secondary | ICD-10-CM | POA: Diagnosis not present

## 2020-10-20 DIAGNOSIS — Z23 Encounter for immunization: Secondary | ICD-10-CM | POA: Diagnosis not present

## 2020-10-20 DIAGNOSIS — Z0001 Encounter for general adult medical examination with abnormal findings: Secondary | ICD-10-CM | POA: Diagnosis not present

## 2020-10-20 DIAGNOSIS — E54 Ascorbic acid deficiency: Secondary | ICD-10-CM | POA: Diagnosis not present

## 2020-10-22 DIAGNOSIS — M5136 Other intervertebral disc degeneration, lumbar region: Secondary | ICD-10-CM | POA: Diagnosis not present

## 2020-10-28 DIAGNOSIS — I272 Pulmonary hypertension, unspecified: Secondary | ICD-10-CM | POA: Diagnosis not present

## 2020-10-28 DIAGNOSIS — I5042 Chronic combined systolic (congestive) and diastolic (congestive) heart failure: Secondary | ICD-10-CM | POA: Diagnosis not present

## 2020-10-28 DIAGNOSIS — I10 Essential (primary) hypertension: Secondary | ICD-10-CM | POA: Diagnosis not present

## 2020-10-28 DIAGNOSIS — Z950 Presence of cardiac pacemaker: Secondary | ICD-10-CM | POA: Diagnosis not present

## 2020-10-28 DIAGNOSIS — I428 Other cardiomyopathies: Secondary | ICD-10-CM | POA: Diagnosis not present

## 2020-10-28 DIAGNOSIS — I5032 Chronic diastolic (congestive) heart failure: Secondary | ICD-10-CM | POA: Diagnosis not present

## 2020-10-28 DIAGNOSIS — J9611 Chronic respiratory failure with hypoxia: Secondary | ICD-10-CM | POA: Diagnosis not present

## 2020-10-28 DIAGNOSIS — Z7901 Long term (current) use of anticoagulants: Secondary | ICD-10-CM | POA: Diagnosis not present

## 2020-10-28 DIAGNOSIS — R739 Hyperglycemia, unspecified: Secondary | ICD-10-CM | POA: Diagnosis not present

## 2020-10-28 DIAGNOSIS — I480 Paroxysmal atrial fibrillation: Secondary | ICD-10-CM | POA: Diagnosis not present

## 2020-10-28 DIAGNOSIS — C73 Malignant neoplasm of thyroid gland: Secondary | ICD-10-CM | POA: Diagnosis not present

## 2020-10-28 DIAGNOSIS — E890 Postprocedural hypothyroidism: Secondary | ICD-10-CM | POA: Diagnosis not present

## 2020-10-29 ENCOUNTER — Encounter: Payer: Self-pay | Admitting: Oncology

## 2020-10-29 ENCOUNTER — Inpatient Hospital Stay: Payer: Medicare Other | Attending: Oncology

## 2020-10-29 ENCOUNTER — Other Ambulatory Visit: Payer: Self-pay

## 2020-10-29 DIAGNOSIS — D519 Vitamin B12 deficiency anemia, unspecified: Secondary | ICD-10-CM | POA: Diagnosis not present

## 2020-10-29 DIAGNOSIS — D509 Iron deficiency anemia, unspecified: Secondary | ICD-10-CM | POA: Diagnosis not present

## 2020-10-29 DIAGNOSIS — Z9884 Bariatric surgery status: Secondary | ICD-10-CM | POA: Diagnosis not present

## 2020-10-29 DIAGNOSIS — Z0000 Encounter for general adult medical examination without abnormal findings: Secondary | ICD-10-CM | POA: Diagnosis not present

## 2020-10-29 DIAGNOSIS — D508 Other iron deficiency anemias: Secondary | ICD-10-CM

## 2020-10-29 DIAGNOSIS — Z0001 Encounter for general adult medical examination with abnormal findings: Secondary | ICD-10-CM | POA: Diagnosis not present

## 2020-10-29 DIAGNOSIS — D649 Anemia, unspecified: Secondary | ICD-10-CM | POA: Diagnosis not present

## 2020-10-29 LAB — SEDIMENTATION RATE: Sed Rate: 16 mm/hr (ref 0–22)

## 2020-10-29 LAB — CBC WITH DIFFERENTIAL/PLATELET
Abs Immature Granulocytes: 0.01 10*3/uL (ref 0.00–0.07)
Basophils Absolute: 0 10*3/uL (ref 0.0–0.1)
Basophils Relative: 1 %
Eosinophils Absolute: 0.1 10*3/uL (ref 0.0–0.5)
Eosinophils Relative: 3 %
HCT: 30.8 % — ABNORMAL LOW (ref 36.0–46.0)
Hemoglobin: 9.8 g/dL — ABNORMAL LOW (ref 12.0–15.0)
Immature Granulocytes: 0 %
Lymphocytes Relative: 24 %
Lymphs Abs: 0.9 10*3/uL (ref 0.7–4.0)
MCH: 27.5 pg (ref 26.0–34.0)
MCHC: 31.8 g/dL (ref 30.0–36.0)
MCV: 86.5 fL (ref 80.0–100.0)
Monocytes Absolute: 0.3 10*3/uL (ref 0.1–1.0)
Monocytes Relative: 8 %
Neutro Abs: 2.4 10*3/uL (ref 1.7–7.7)
Neutrophils Relative %: 64 %
Platelets: 198 10*3/uL (ref 150–400)
RBC: 3.56 MIL/uL — ABNORMAL LOW (ref 3.87–5.11)
RDW: 13.8 % (ref 11.5–15.5)
WBC: 3.8 10*3/uL — ABNORMAL LOW (ref 4.0–10.5)
nRBC: 0 % (ref 0.0–0.2)

## 2020-10-29 LAB — RETICULOCYTES
Immature Retic Fract: 11.4 % (ref 2.3–15.9)
RBC.: 3.57 MIL/uL — ABNORMAL LOW (ref 3.87–5.11)
Retic Count, Absolute: 17.9 10*3/uL — ABNORMAL LOW (ref 19.0–186.0)
Retic Ct Pct: 0.5 % (ref 0.4–3.1)

## 2020-10-29 LAB — VITAMIN B12: Vitamin B-12: 1017 pg/mL — ABNORMAL HIGH (ref 180–914)

## 2020-10-29 LAB — FOLATE: Folate: 31.9 ng/mL (ref >5.9)

## 2020-10-29 LAB — FERRITIN: Ferritin: 16 ng/mL (ref 11–307)

## 2020-10-29 LAB — C-REACTIVE PROTEIN: CRP: 0.5 mg/dL (ref <1.0)

## 2020-10-29 LAB — SAVE SMEAR(SSMR), FOR PROVIDER SLIDE REVIEW

## 2020-10-31 ENCOUNTER — Other Ambulatory Visit: Payer: Self-pay | Admitting: Oncology

## 2020-11-01 ENCOUNTER — Inpatient Hospital Stay: Payer: Medicare Other | Admitting: Oncology

## 2020-11-01 ENCOUNTER — Other Ambulatory Visit: Payer: Medicare Other

## 2020-11-03 ENCOUNTER — Encounter: Payer: Self-pay | Admitting: Adult Health

## 2020-11-03 ENCOUNTER — Other Ambulatory Visit: Payer: Self-pay | Admitting: Adult Health

## 2020-11-03 NOTE — Progress Notes (Signed)
Venofer '500mg'$  x 2 ordered,  sent orders to [place]. Infusion center.  Wilber Bihari, NP

## 2020-11-08 ENCOUNTER — Other Ambulatory Visit: Payer: Self-pay

## 2020-11-08 ENCOUNTER — Ambulatory Visit (INDEPENDENT_AMBULATORY_CARE_PROVIDER_SITE_OTHER): Payer: Medicare Other

## 2020-11-08 VITALS — BP 144/89 | HR 71 | Temp 98.2°F | Resp 18 | Ht 67.0 in | Wt 183.0 lb

## 2020-11-08 DIAGNOSIS — D508 Other iron deficiency anemias: Secondary | ICD-10-CM | POA: Diagnosis not present

## 2020-11-08 DIAGNOSIS — D649 Anemia, unspecified: Secondary | ICD-10-CM

## 2020-11-08 MED ORDER — ALBUTEROL SULFATE HFA 108 (90 BASE) MCG/ACT IN AERS: 2.0000 | INHALATION_SPRAY | Freq: Once | RESPIRATORY_TRACT | Status: DC | PRN

## 2020-11-08 MED ORDER — SODIUM CHLORIDE 0.9 % IV SOLN: Freq: Once | INTRAVENOUS | Status: DC | PRN

## 2020-11-08 MED ORDER — EPINEPHRINE 0.3 MG/0.3ML IJ SOAJ: 0.3000 mg | Freq: Once | INTRAMUSCULAR | Status: DC | PRN

## 2020-11-08 MED ORDER — DIPHENHYDRAMINE HCL 25 MG PO CAPS: 25.0000 mg | ORAL_CAPSULE | Freq: Once | ORAL | Status: DC

## 2020-11-08 MED ORDER — SODIUM CHLORIDE 0.9 % IV SOLN
500.0000 mg | Freq: Once | INTRAVENOUS | Status: DC
  Filled 2020-11-08: qty 25

## 2020-11-08 MED ORDER — ALTEPLASE 2 MG IJ SOLR: 2.0000 mg | Freq: Once | INTRAMUSCULAR | Status: DC | PRN

## 2020-11-08 MED ORDER — HEPARIN SOD (PORK) LOCK FLUSH 100 UNIT/ML IV SOLN: 500.0000 [IU] | Freq: Once | INTRAVENOUS | Status: DC | PRN

## 2020-11-08 MED ORDER — DIPHENHYDRAMINE HCL 50 MG/ML IJ SOLN: 50.0000 mg | Freq: Once | INTRAMUSCULAR | Status: DC | PRN

## 2020-11-08 MED ORDER — SODIUM CHLORIDE 0.9% FLUSH: 3.0000 mL | Freq: Once | INTRAVENOUS | Status: DC | PRN

## 2020-11-08 MED ORDER — FAMOTIDINE IN NACL 20-0.9 MG/50ML-% IV SOLN: 20.0000 mg | Freq: Once | INTRAVENOUS | Status: DC | PRN

## 2020-11-08 MED ORDER — ACETAMINOPHEN 325 MG PO TABS
650.0000 mg | ORAL_TABLET | Freq: Once | ORAL | Status: AC
  Administered 2020-11-08: 650 mg via ORAL
  Filled 2020-11-08: qty 2

## 2020-11-08 MED ORDER — METHYLPREDNISOLONE SODIUM SUCC 125 MG IJ SOLR: 125.0000 mg | Freq: Once | INTRAMUSCULAR | Status: DC | PRN

## 2020-11-08 MED ORDER — SODIUM CHLORIDE 0.9% FLUSH: 10.0000 mL | Freq: Once | INTRAVENOUS | Status: DC | PRN

## 2020-11-08 MED ORDER — ANTICOAGULANT SODIUM CITRATE 4% (200MG/5ML) IV SOLN
5.0000 mL | Freq: Once | Status: DC | PRN
  Filled 2020-11-08: qty 5

## 2020-11-08 MED ORDER — HEPARIN SOD (PORK) LOCK FLUSH 100 UNIT/ML IV SOLN: 250.0000 [IU] | Freq: Once | INTRAVENOUS | Status: DC | PRN

## 2020-11-08 NOTE — Progress Notes (Addendum)
Diagnosis: Iron Deficiency Anemia  Provider:  Marshell Garfinkel, MD  Procedure: Infusion  IV Type: Peripheral, IV Location: R Hand  Venofer (Iron Sucrose), Dose: 500 mg  Infusion Start Time: 10.44 11/08/2020  Infusion Stop Time: [id number]   11/08/2020  Post Infusion IV Care: 30 minutesObservation period completed and Peripheral IV Discontinued  Discharge: Condition: Good, Destination: Home . AVS provided to patient.   Performed by:  Arnoldo Morale, RN

## 2020-11-10 ENCOUNTER — Ambulatory Visit: Payer: Medicare Other

## 2020-11-12 ENCOUNTER — Ambulatory Visit: Payer: Medicare Other

## 2020-11-12 DIAGNOSIS — K315 Obstruction of duodenum: Secondary | ICD-10-CM | POA: Diagnosis not present

## 2020-11-12 DIAGNOSIS — Z9884 Bariatric surgery status: Secondary | ICD-10-CM | POA: Diagnosis not present

## 2020-11-12 DIAGNOSIS — K6389 Other specified diseases of intestine: Secondary | ICD-10-CM | POA: Diagnosis not present

## 2020-11-16 ENCOUNTER — Ambulatory Visit (INDEPENDENT_AMBULATORY_CARE_PROVIDER_SITE_OTHER): Payer: Medicare Other | Admitting: *Deleted

## 2020-11-16 ENCOUNTER — Telehealth: Payer: Self-pay | Admitting: Internal Medicine

## 2020-11-16 ENCOUNTER — Other Ambulatory Visit: Payer: Self-pay

## 2020-11-16 VITALS — BP 112/75 | HR 62 | Temp 97.4°F | Resp 18 | Ht 67.0 in | Wt 183.0 lb

## 2020-11-16 DIAGNOSIS — N201 Calculus of ureter: Secondary | ICD-10-CM | POA: Diagnosis not present

## 2020-11-16 DIAGNOSIS — D508 Other iron deficiency anemias: Secondary | ICD-10-CM | POA: Diagnosis not present

## 2020-11-16 DIAGNOSIS — D649 Anemia, unspecified: Secondary | ICD-10-CM

## 2020-11-16 DIAGNOSIS — N3941 Urge incontinence: Secondary | ICD-10-CM | POA: Diagnosis not present

## 2020-11-16 MED ORDER — METHYLPREDNISOLONE SODIUM SUCC 125 MG IJ SOLR: 125.0000 mg | Freq: Once | INTRAMUSCULAR | Status: DC | PRN

## 2020-11-16 MED ORDER — SODIUM CHLORIDE 0.9 % IV SOLN
500.0000 mg | Freq: Once | INTRAVENOUS | Status: AC
  Administered 2020-11-16: 500 mg via INTRAVENOUS
  Filled 2020-11-16: qty 25

## 2020-11-16 MED ORDER — DIPHENHYDRAMINE HCL 25 MG PO CAPS: 25.0000 mg | ORAL_CAPSULE | Freq: Once | ORAL | Status: DC

## 2020-11-16 MED ORDER — ALBUTEROL SULFATE HFA 108 (90 BASE) MCG/ACT IN AERS: 2.0000 | INHALATION_SPRAY | Freq: Once | RESPIRATORY_TRACT | Status: DC | PRN

## 2020-11-16 MED ORDER — SODIUM CHLORIDE 0.9 % IV SOLN: Freq: Once | INTRAVENOUS | Status: DC | PRN

## 2020-11-16 MED ORDER — FAMOTIDINE IN NACL 20-0.9 MG/50ML-% IV SOLN: 20.0000 mg | Freq: Once | INTRAVENOUS | Status: DC | PRN

## 2020-11-16 MED ORDER — DIPHENHYDRAMINE HCL 50 MG/ML IJ SOLN: 50.0000 mg | Freq: Once | INTRAMUSCULAR | Status: DC | PRN

## 2020-11-16 MED ORDER — EPINEPHRINE 0.3 MG/0.3ML IJ SOAJ: 0.3000 mg | Freq: Once | INTRAMUSCULAR | Status: DC | PRN

## 2020-11-16 MED ORDER — ACETAMINOPHEN 325 MG PO TABS
650.0000 mg | ORAL_TABLET | Freq: Once | ORAL | Status: AC
  Administered 2020-11-16: 650 mg via ORAL

## 2020-11-16 NOTE — Telephone Encounter (Signed)
   Okay to hold Eliquis 3days preoperatively for her upcoming surgery.  She is cleared for surgery.  No need for bridging anticoagulation.  Thanks for taking care of her.  Pixie Casino, MD, [facility], [facility] Director of the [facility]  [facility] Diplomate of the American Board of Clinical Lipidology Attending Cardiologist  Direct Dial: [phone]  Fax: [phone]  Website:  [email or web address]

## 2020-11-16 NOTE — Telephone Encounter (Signed)
Patient is scheduled for surgery on 11/30/2020 for resection of the distal duodenum/jejunum with Dr. Chaney Malling @ [facility]. Per correspondence from this provider, the plan is to stop anticoagulation (eliquis) 3 days prior to surgery - question if she needed lovenox bridging.   Routed to Dr. [name] to review/advise  Office contact info: Phone: [phone] Fax: [phone]  Address: [facility] [address] [place] [address]  Dr. Earley Favor Herbert/Surgical Oncology - phone: [phone]

## 2020-11-16 NOTE — Progress Notes (Signed)
Diagnosis: Iron Deficiency Anemia  Provider:  Marshell Garfinkel, MD  Procedure: Infusion  IV Type: Peripheral, IV Location: L Forearm  Venofer (Iron Sucrose), Dose: 500 mg  Infusion Start Time: 1117am  Infusion Stop Time: 1230  pm due to infiltration, call made to Team leader Received call back from MD at 1:20 pm, currently we are not to restart IV, patient will be seen in cancer center at a later time  Post Infusion IV Care: Observation period completed and Peripheral IV Discontinued  Discharge: Condition: Good, Destination: Home . AVS provided to patient.   Performed by:  Oren Beckmann, RN

## 2020-11-17 ENCOUNTER — Inpatient Hospital Stay: Payer: Medicare Other

## 2020-11-17 IMAGING — MR MRI HEAD WITHOUT CONTRAST
7 series · 48 of 48 positions shown · non-contrast
Comparison: None.

CLINICAL DATA: Stroke follow-up

EXAM:
MRI HEAD WITHOUT CONTRAST
TECHNIQUE: Multiplanar, multiecho pulse sequences of the brain and surrounding
structures were obtained without intravenous contrast.

[Series 5: DWI · axial · 3.0mm · 0.88mm/px · z∈[+38,+166]mm · 12 of 96 slices shown (1 of 6)]
[im 1/96]
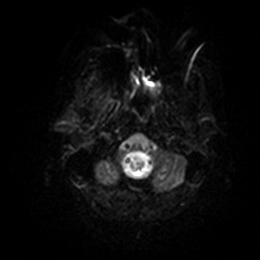
[im 9/96]
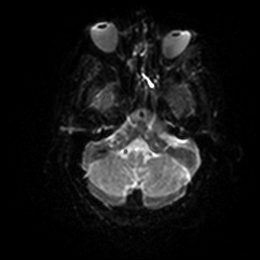
[im 18/96]
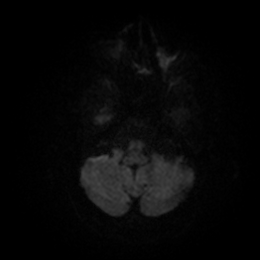
[im 26/96]
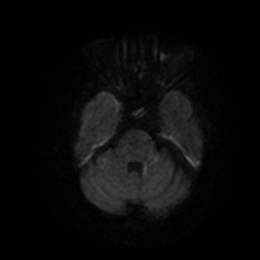
[im 35/96]
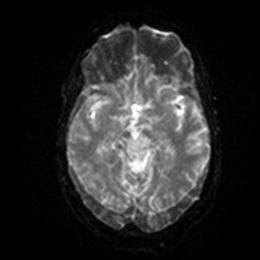
[im 44/96]
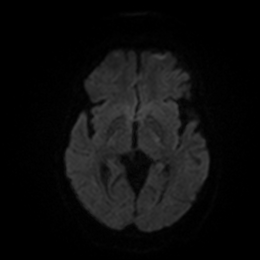
[im 52/96]
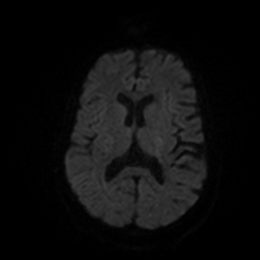
[im 61/96]
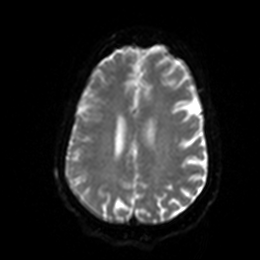
[im 70/96]
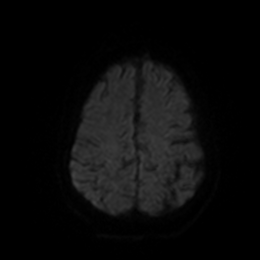
[im 78/96]
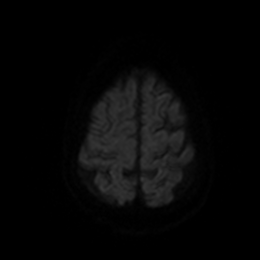
[im 87/96]
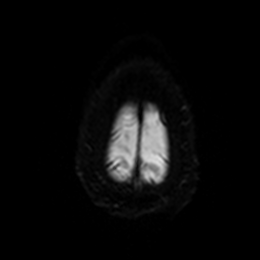
[im 96/96]
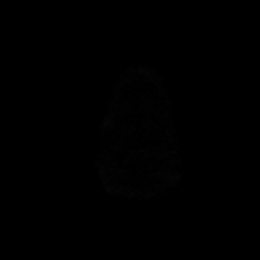

[Series 6: DWI · axial · 3.0mm · 0.88mm/px · z∈[+38,+166]mm · 5 of 48 slices shown (2 of 6)]
[im 1/48]
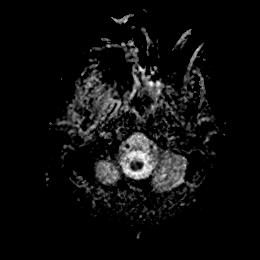
[im 12/48]
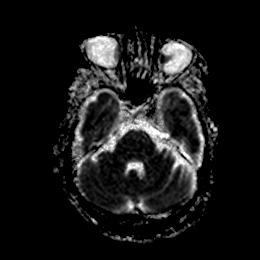
[im 24/48]
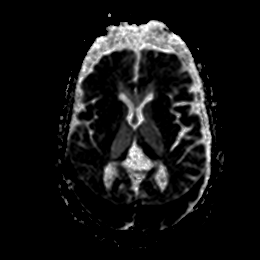
[im 36/48]
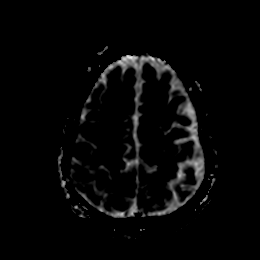
[im 48/48]
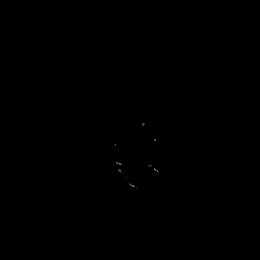

[Series 7: DWI · coronal · 4.0mm · 0.88mm/px · 8 of 72 slices shown (3 of 6)]
[im 1/72]
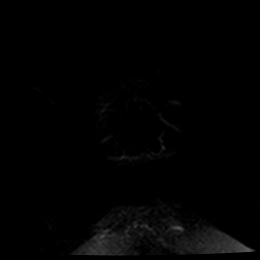
[im 11/72]
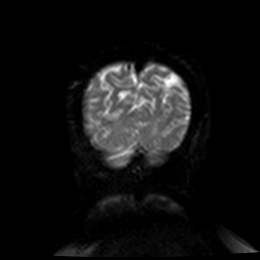
[im 21/72]
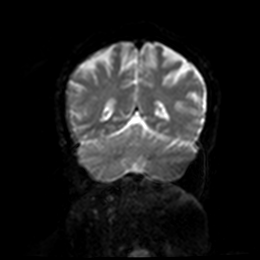
[im 31/72]
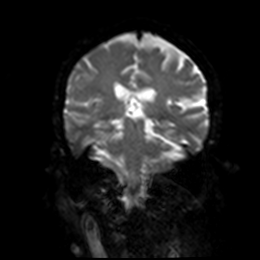
[im 41/72]
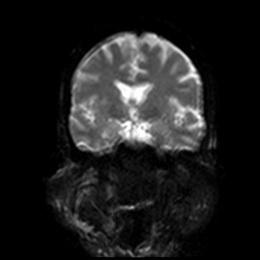
[im 51/72]
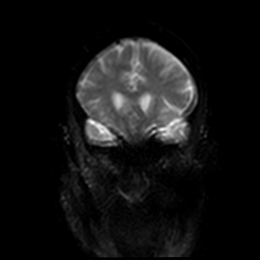
[im 61/72]
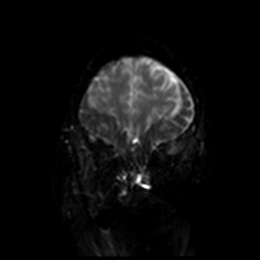
[im 72/72]
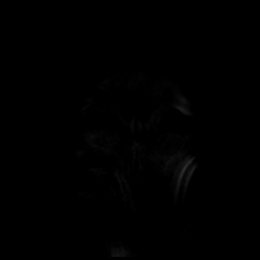

[Series 8: DWI · coronal · 4.0mm · 0.88mm/px · 4 of 36 slices shown (4 of 6)]
[im 1/36]
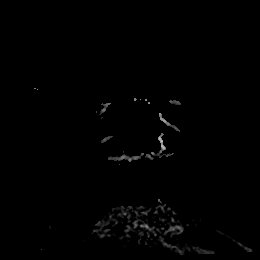
[im 12/36]
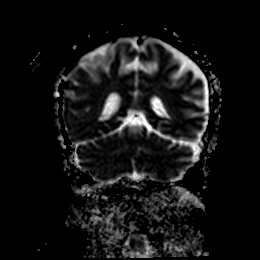
[im 24/36]
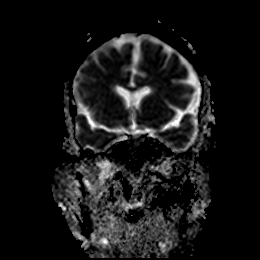
[im 36/36]
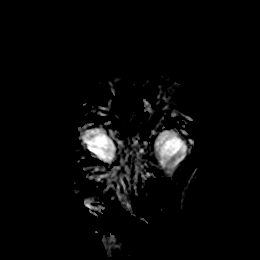

[Series 9: T1 · sagittal · 5.0mm · 0.75mm/px · 3 of 25 slices shown]
[im 1/25]
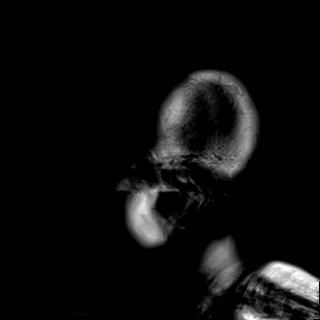
[im 13/25]
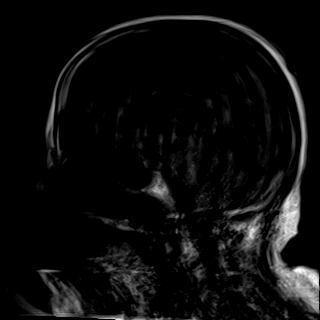
[im 25/25]
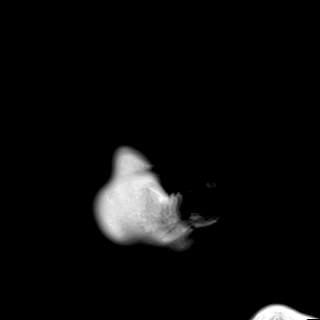

[Series 14: DWI · axial · 3.0mm · 0.88mm/px · z∈[+53,+179]mm · 11 of 96 slices shown (5 of 6)]
[im 1/96]
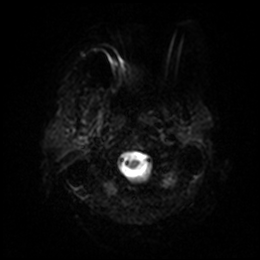
[im 10/96]
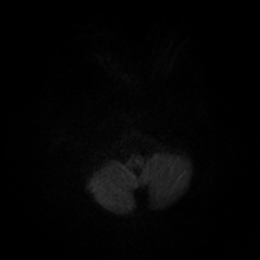
[im 20/96]
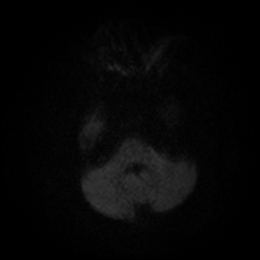
[im 29/96]
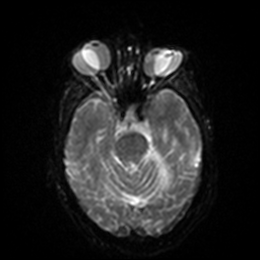
[im 39/96]
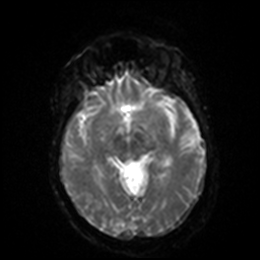
[im 48/96]
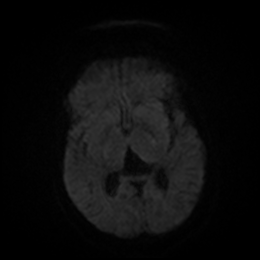
[im 58/96]
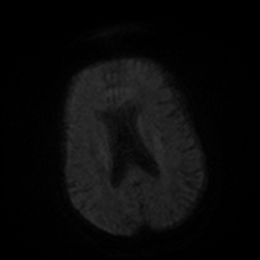
[im 67/96]
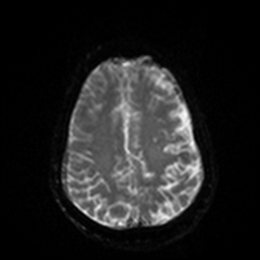
[im 77/96]
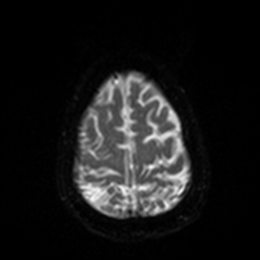
[im 86/96]
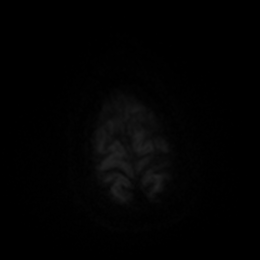
[im 96/96]
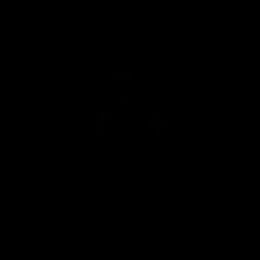

[Series 15: DWI · axial · 3.0mm · 0.88mm/px · z∈[+53,+176]mm · 5 of 47 slices shown (6 of 6)]
[im 1/47]
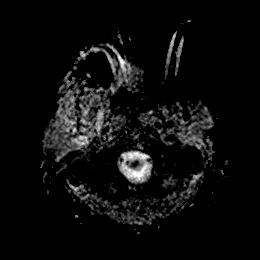
[im 12/47]
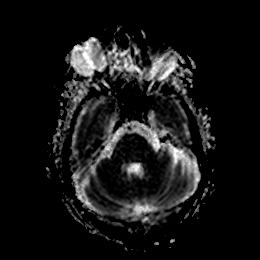
[im 24/47]
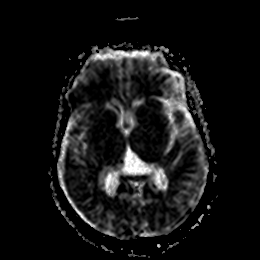
[im 35/47]
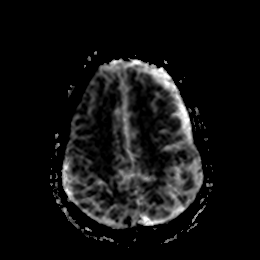
[im 47/47]
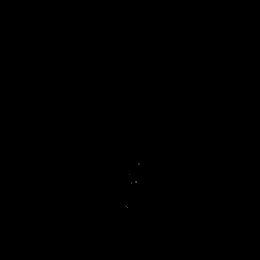

[48 of 48 positions shown; findings below may reference images not displayed]

FINDINGS: The study is degraded by motion. The examination had to be
discontinued prior to completion due to patient altered mental
status and inability to cooperate with technologist's instructions.
Axial and coronal diffusion-weighted imaging and sagittal
T1-weighted imaging was obtained.

There is no abnormal diffusion restriction to indicate acute
ischemia. There is no midline shift or other mass effect. Size and
configuration of the ventricles are normal.
IMPRESSION: Truncated and motion limited examination without acute ischemia.

## 2020-11-17 IMAGING — CT CT HEAD WITHOUT CONTRAST
4 series · 15 of 47 positions shown, 17 images · non-contrast
Comparison: None.

CLINICAL DATA: Altered level of consciousness

EXAM:
CT HEAD WITHOUT CONTRAST
TECHNIQUE: Contiguous axial images were obtained from the base of the skull
through the vertex without intravenous contrast.

[Series 3: head without · axial · non-contrast · 0.46mm/px · z∈[-71,+54]mm · 7 of 35 slices shown, 9 images]
[im 5/35  brain]
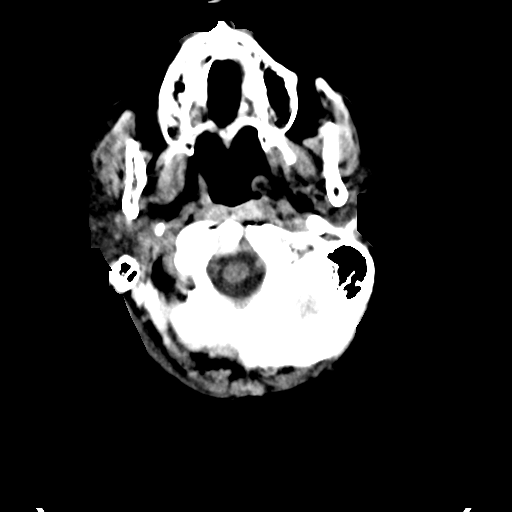
[im 5/35  bone]
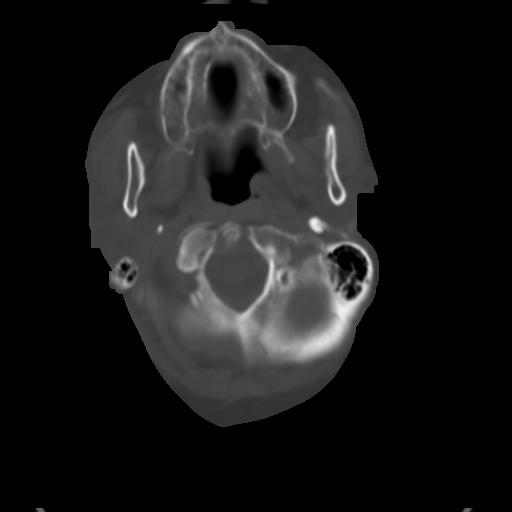
[im 9/35  brain]
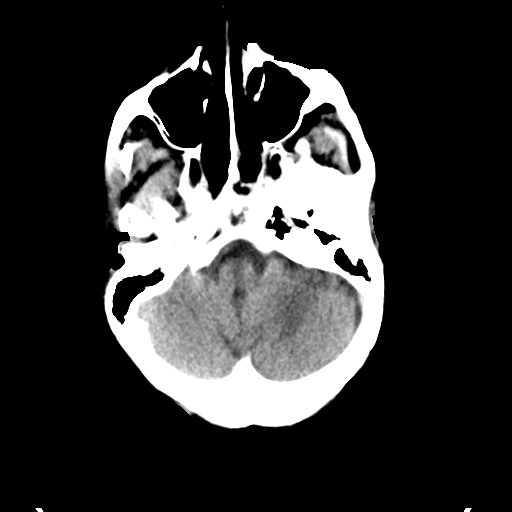
[im 13/35  brain]
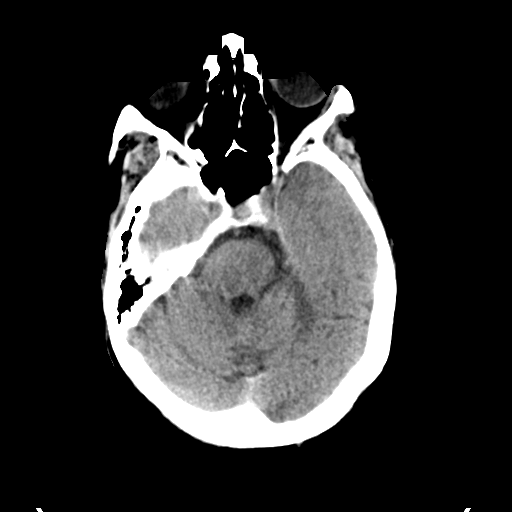
[im 18/35  brain]
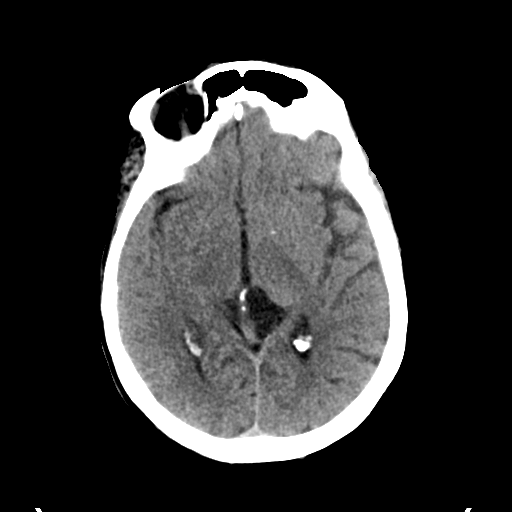
[im 22/35  brain]
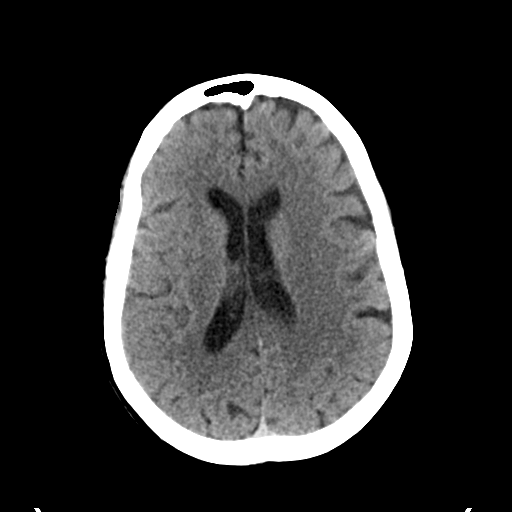
[im 22/35  bone]
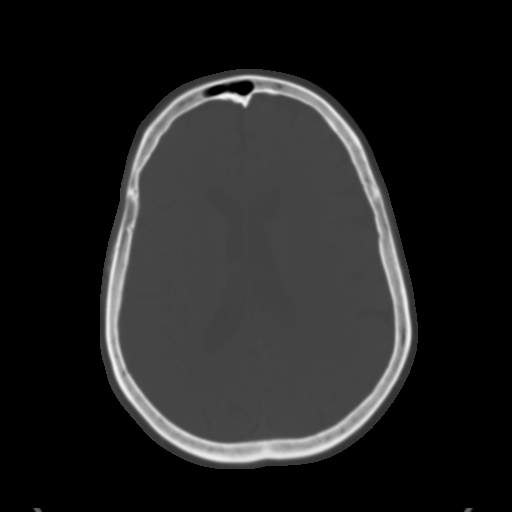
[im 26/35  brain]
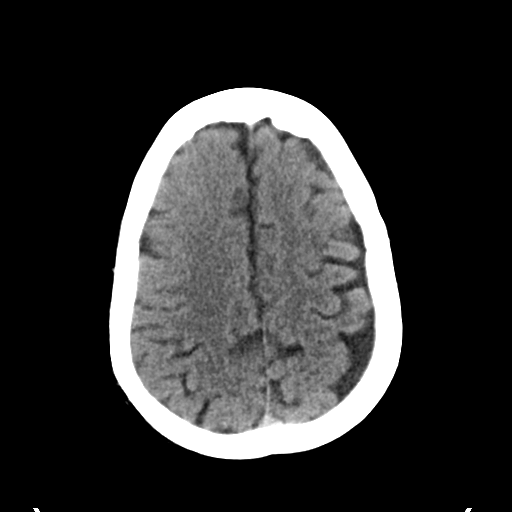
[im 30/35  brain]
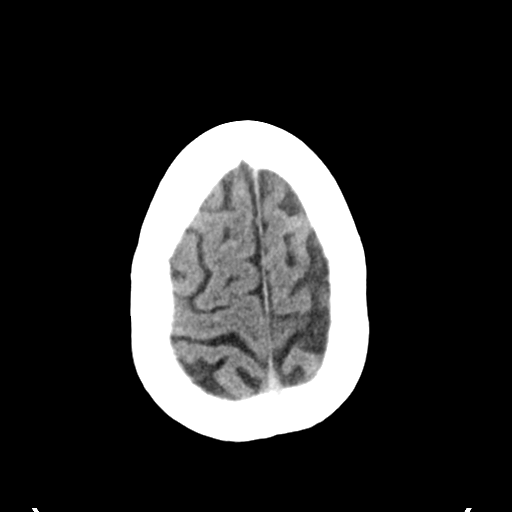

[Series 4: head bone · axial · 0.46mm/px · z∈[-75,-57]mm · 2 of 87 slices shown]
[im 9/87  bone]
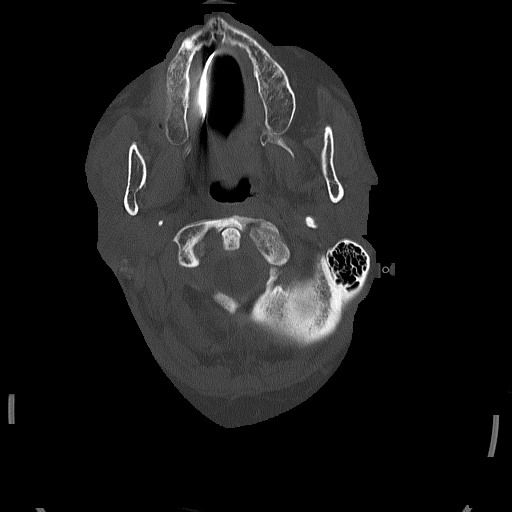
[im 18/87  bone]
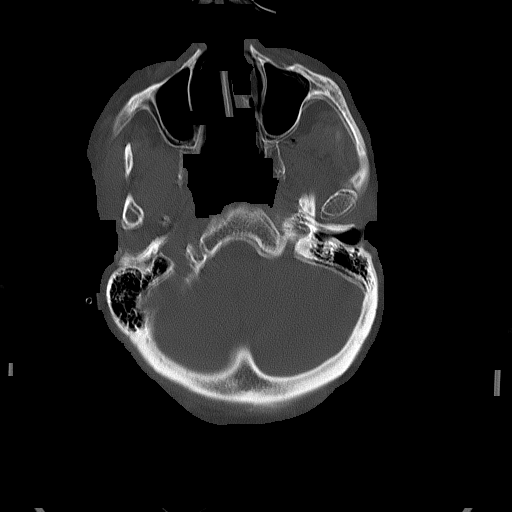

[Series 5: head without cor · coronal · non-contrast · 0.34mm/px · 3 of 76 slices shown]
[im 26/76  brain]
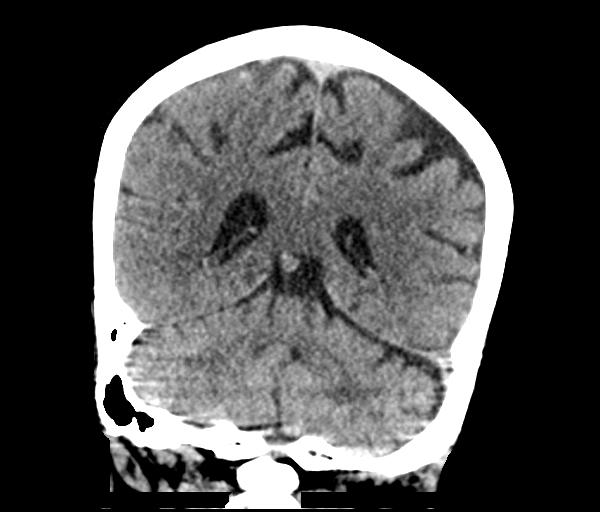
[im 34/76  brain]
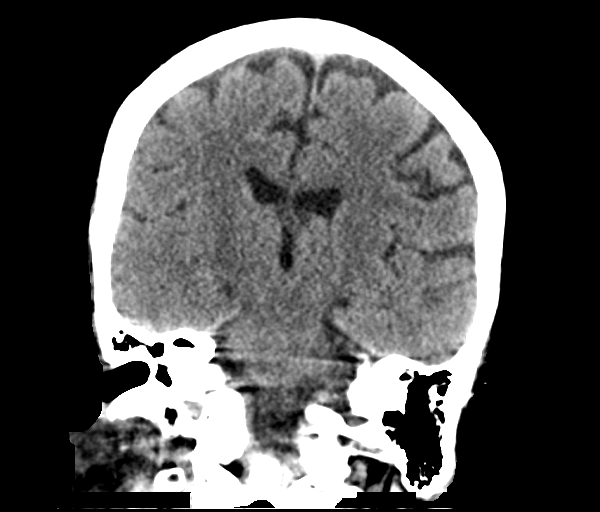
[im 42/76  brain]
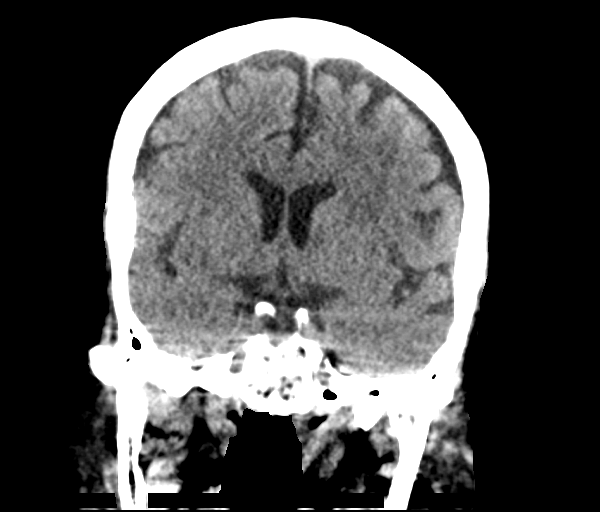

[Series 6: head without sag · sagittal · non-contrast · 0.34mm/px · 3 of 67 slices shown]
[im 23/67  brain]
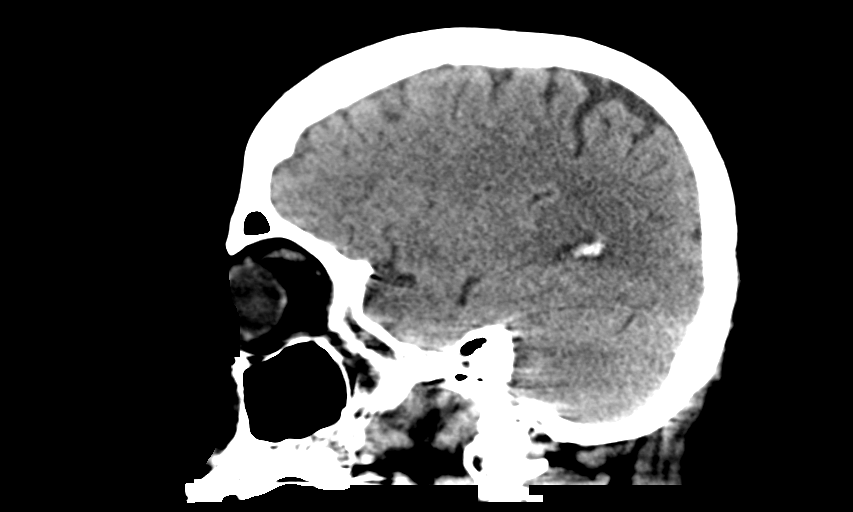
[im 34/67  brain]
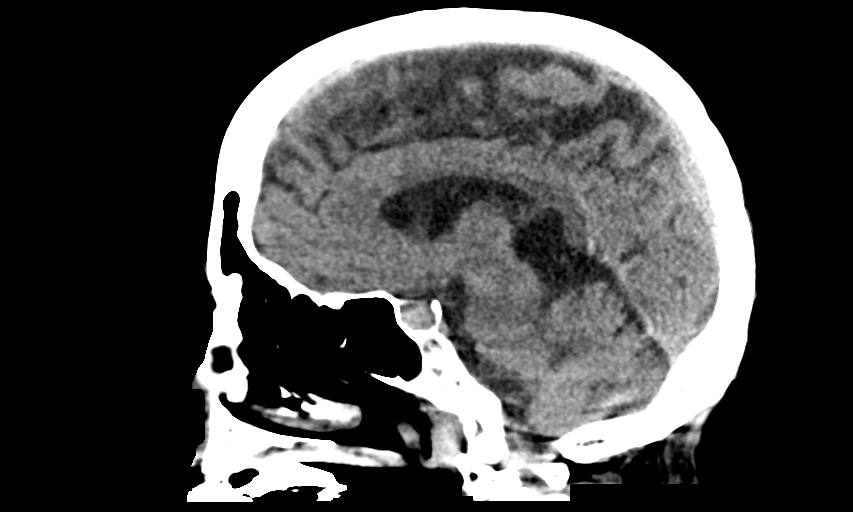
[im 45/67  brain]
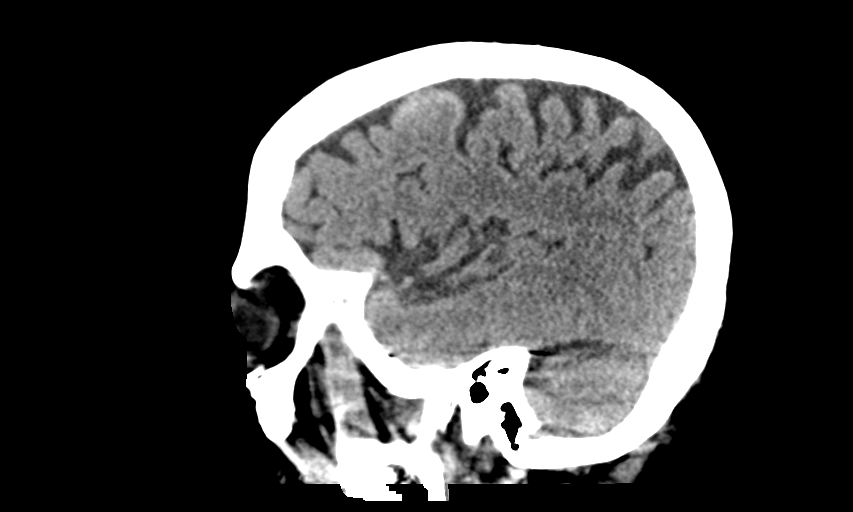

[15 of 47 positions shown; findings below may reference images not displayed]

FINDINGS: Brain: Low attenuation in the left cerebellum most concerning for an
acute infarct. No evidence of hemorrhage, hydrocephalus, extra-axial
collection or mass lesion/mass effect.

Vascular: No hyperdense vessel or unexpected calcification.

Skull: No osseous abnormality.

Sinuses/Orbits: Visualized paranasal sinuses are clear. Visualized
mastoid sinuses are clear. Visualized orbits demonstrate no focal
abnormality.

Other: None
IMPRESSION: 1. Acute nonhemorrhagic left cerebellar infarct.

## 2020-11-27 DIAGNOSIS — Z20822 Contact with and (suspected) exposure to covid-19: Secondary | ICD-10-CM | POA: Diagnosis not present

## 2020-11-30 DIAGNOSIS — I4891 Unspecified atrial fibrillation: Secondary | ICD-10-CM | POA: Diagnosis not present

## 2020-11-30 DIAGNOSIS — K9189 Other postprocedural complications and disorders of digestive system: Secondary | ICD-10-CM | POA: Diagnosis not present

## 2020-11-30 DIAGNOSIS — E669 Obesity, unspecified: Secondary | ICD-10-CM | POA: Diagnosis present

## 2020-11-30 DIAGNOSIS — D62 Acute posthemorrhagic anemia: Secondary | ICD-10-CM | POA: Diagnosis not present

## 2020-11-30 DIAGNOSIS — Z85850 Personal history of malignant neoplasm of thyroid: Secondary | ICD-10-CM | POA: Diagnosis not present

## 2020-11-30 DIAGNOSIS — K567 Ileus, unspecified: Secondary | ICD-10-CM | POA: Diagnosis not present

## 2020-11-30 DIAGNOSIS — C178 Malignant neoplasm of overlapping sites of small intestine: Secondary | ICD-10-CM | POA: Diagnosis present

## 2020-11-30 DIAGNOSIS — M810 Age-related osteoporosis without current pathological fracture: Secondary | ICD-10-CM | POA: Diagnosis present

## 2020-11-30 DIAGNOSIS — I9581 Postprocedural hypotension: Secondary | ICD-10-CM | POA: Diagnosis not present

## 2020-11-30 DIAGNOSIS — Z87442 Personal history of urinary calculi: Secondary | ICD-10-CM | POA: Diagnosis not present

## 2020-11-30 DIAGNOSIS — I480 Paroxysmal atrial fibrillation: Secondary | ICD-10-CM | POA: Diagnosis present

## 2020-11-30 DIAGNOSIS — Z9049 Acquired absence of other specified parts of digestive tract: Secondary | ICD-10-CM | POA: Diagnosis not present

## 2020-11-30 DIAGNOSIS — I2510 Atherosclerotic heart disease of native coronary artery without angina pectoris: Secondary | ICD-10-CM | POA: Diagnosis present

## 2020-11-30 DIAGNOSIS — R570 Cardiogenic shock: Secondary | ICD-10-CM | POA: Diagnosis not present

## 2020-11-30 DIAGNOSIS — I119 Hypertensive heart disease without heart failure: Secondary | ICD-10-CM | POA: Diagnosis not present

## 2020-11-30 DIAGNOSIS — N1831 Chronic kidney disease, stage 3a: Secondary | ICD-10-CM | POA: Diagnosis present

## 2020-11-30 DIAGNOSIS — J9611 Chronic respiratory failure with hypoxia: Secondary | ICD-10-CM | POA: Diagnosis present

## 2020-11-30 DIAGNOSIS — D1339 Benign neoplasm of other parts of small intestine: Secondary | ICD-10-CM | POA: Diagnosis not present

## 2020-11-30 DIAGNOSIS — Z7901 Long term (current) use of anticoagulants: Secondary | ICD-10-CM | POA: Diagnosis not present

## 2020-11-30 DIAGNOSIS — Z8639 Personal history of other endocrine, nutritional and metabolic disease: Secondary | ICD-10-CM | POA: Diagnosis not present

## 2020-11-30 DIAGNOSIS — Z9889 Other specified postprocedural states: Secondary | ICD-10-CM | POA: Diagnosis not present

## 2020-11-30 DIAGNOSIS — K6389 Other specified diseases of intestine: Secondary | ICD-10-CM | POA: Diagnosis not present

## 2020-11-30 DIAGNOSIS — M797 Fibromyalgia: Secondary | ICD-10-CM | POA: Diagnosis present

## 2020-11-30 DIAGNOSIS — R109 Unspecified abdominal pain: Secondary | ICD-10-CM | POA: Diagnosis not present

## 2020-11-30 DIAGNOSIS — I130 Hypertensive heart and chronic kidney disease with heart failure and stage 1 through stage 4 chronic kidney disease, or unspecified chronic kidney disease: Secondary | ICD-10-CM | POA: Diagnosis present

## 2020-11-30 DIAGNOSIS — J449 Chronic obstructive pulmonary disease, unspecified: Secondary | ICD-10-CM | POA: Diagnosis present

## 2020-11-30 DIAGNOSIS — G8918 Other acute postprocedural pain: Secondary | ICD-10-CM | POA: Diagnosis not present

## 2020-11-30 DIAGNOSIS — I5042 Chronic combined systolic (congestive) and diastolic (congestive) heart failure: Secondary | ICD-10-CM | POA: Diagnosis present

## 2020-11-30 DIAGNOSIS — I482 Chronic atrial fibrillation, unspecified: Secondary | ICD-10-CM | POA: Diagnosis present

## 2020-11-30 DIAGNOSIS — I9589 Other hypotension: Secondary | ICD-10-CM | POA: Diagnosis not present

## 2020-11-30 DIAGNOSIS — K565 Intestinal adhesions [bands], unspecified as to partial versus complete obstruction: Secondary | ICD-10-CM | POA: Diagnosis present

## 2020-11-30 DIAGNOSIS — E890 Postprocedural hypothyroidism: Secondary | ICD-10-CM | POA: Diagnosis present

## 2020-11-30 DIAGNOSIS — I272 Pulmonary hypertension, unspecified: Secondary | ICD-10-CM | POA: Diagnosis present

## 2020-11-30 DIAGNOSIS — D132 Benign neoplasm of duodenum: Secondary | ICD-10-CM | POA: Diagnosis not present

## 2020-11-30 DIAGNOSIS — G40909 Epilepsy, unspecified, not intractable, without status epilepticus: Secondary | ICD-10-CM | POA: Diagnosis present

## 2020-11-30 DIAGNOSIS — K660 Peritoneal adhesions (postprocedural) (postinfection): Secondary | ICD-10-CM | POA: Diagnosis not present

## 2020-11-30 DIAGNOSIS — I428 Other cardiomyopathies: Secondary | ICD-10-CM | POA: Diagnosis present

## 2020-11-30 DIAGNOSIS — D509 Iron deficiency anemia, unspecified: Secondary | ICD-10-CM | POA: Diagnosis present

## 2020-12-07 ENCOUNTER — Other Ambulatory Visit: Payer: Medicare Other

## 2020-12-09 ENCOUNTER — Ambulatory Visit: Payer: Medicare Other | Admitting: Oncology

## 2020-12-13 ENCOUNTER — Other Ambulatory Visit: Payer: Self-pay

## 2020-12-13 DIAGNOSIS — E890 Postprocedural hypothyroidism: Secondary | ICD-10-CM

## 2020-12-13 MED ORDER — LEVOTHYROXINE SODIUM 175 MCG PO TABS
175.0000 ug | ORAL_TABLET | Freq: Every day | ORAL | 0 refills | Status: DC
Start: 2020-12-13 — End: 2020-12-27

## 2020-12-13 NOTE — Telephone Encounter (Signed)
Rx requested received via Fax for Levothyroxine refill

## 2020-12-16 DIAGNOSIS — M8000XD Age-related osteoporosis with current pathological fracture, unspecified site, subsequent encounter for fracture with routine healing: Secondary | ICD-10-CM | POA: Diagnosis not present

## 2020-12-16 DIAGNOSIS — Z0000 Encounter for general adult medical examination without abnormal findings: Secondary | ICD-10-CM | POA: Diagnosis not present

## 2020-12-17 ENCOUNTER — Ambulatory Visit (INDEPENDENT_AMBULATORY_CARE_PROVIDER_SITE_OTHER): Payer: Medicare Other

## 2020-12-17 DIAGNOSIS — I442 Atrioventricular block, complete: Secondary | ICD-10-CM | POA: Diagnosis not present

## 2020-12-17 LAB — CUP PACEART REMOTE DEVICE CHECK
Battery Remaining Longevity: 130 mo
Battery Voltage: 3.02 V
Brady Statistic AP VP Percent: 0 %
Brady Statistic AP VS Percent: 0 %
Brady Statistic AS VP Percent: 99.31 %
Brady Statistic AS VS Percent: 0.69 %
Brady Statistic RA Percent Paced: 0 %
Brady Statistic RV Percent Paced: 99.31 %
Date Time Interrogation Session: 20221027202052
Implantable Lead Implant Date: 20210430
Implantable Lead Location: 753860
Implantable Lead Model: 3830
Implantable Pulse Generator Implant Date: 20210430
Lead Channel Impedance Value: 3382 Ohm
Lead Channel Impedance Value: 3382 Ohm
Lead Channel Impedance Value: 380 Ohm
Lead Channel Impedance Value: 551 Ohm
Lead Channel Pacing Threshold Amplitude: 0.75 V
Lead Channel Pacing Threshold Pulse Width: 0.4 ms
Lead Channel Sensing Intrinsic Amplitude: 15.875 mV
Lead Channel Sensing Intrinsic Amplitude: 15.875 mV
Lead Channel Setting Pacing Amplitude: 2.5 V
Lead Channel Setting Pacing Pulse Width: 0.4 ms
Lead Channel Setting Sensing Sensitivity: 4 mV

## 2020-12-22 DIAGNOSIS — I5032 Chronic diastolic (congestive) heart failure: Secondary | ICD-10-CM | POA: Diagnosis not present

## 2020-12-22 DIAGNOSIS — I4811 Longstanding persistent atrial fibrillation: Secondary | ICD-10-CM | POA: Diagnosis not present

## 2020-12-22 DIAGNOSIS — Z9889 Other specified postprocedural states: Secondary | ICD-10-CM | POA: Diagnosis not present

## 2020-12-22 DIAGNOSIS — Z950 Presence of cardiac pacemaker: Secondary | ICD-10-CM | POA: Diagnosis not present

## 2020-12-26 ENCOUNTER — Encounter: Payer: Self-pay | Admitting: Internal Medicine

## 2020-12-27 ENCOUNTER — Other Ambulatory Visit: Payer: Self-pay | Admitting: Internal Medicine

## 2020-12-27 DIAGNOSIS — E890 Postprocedural hypothyroidism: Secondary | ICD-10-CM

## 2020-12-27 MED ORDER — LEVOTHYROXINE SODIUM 150 MCG PO TABS: 150.0000 ug | ORAL_TABLET | Freq: Every day | ORAL | 1 refills | Status: DC

## 2020-12-27 NOTE — Progress Notes (Signed)
Remote pacemaker transmission.   

## 2020-12-30 DIAGNOSIS — D508 Other iron deficiency anemias: Secondary | ICD-10-CM | POA: Diagnosis not present

## 2021-01-03 DIAGNOSIS — M79671 Pain in right foot: Secondary | ICD-10-CM | POA: Diagnosis not present

## 2021-01-03 DIAGNOSIS — M2041 Other hammer toe(s) (acquired), right foot: Secondary | ICD-10-CM | POA: Diagnosis not present

## 2021-01-03 DIAGNOSIS — L84 Corns and callosities: Secondary | ICD-10-CM | POA: Diagnosis not present

## 2021-01-03 DIAGNOSIS — M7741 Metatarsalgia, right foot: Secondary | ICD-10-CM | POA: Diagnosis not present

## 2021-01-03 DIAGNOSIS — M2042 Other hammer toe(s) (acquired), left foot: Secondary | ICD-10-CM | POA: Diagnosis not present

## 2021-01-03 DIAGNOSIS — B351 Tinea unguium: Secondary | ICD-10-CM | POA: Diagnosis not present

## 2021-01-03 DIAGNOSIS — I7389 Other specified peripheral vascular diseases: Secondary | ICD-10-CM | POA: Diagnosis not present

## 2021-01-03 DIAGNOSIS — G603 Idiopathic progressive neuropathy: Secondary | ICD-10-CM | POA: Diagnosis not present

## 2021-01-07 ENCOUNTER — Ambulatory Visit: Payer: Medicare Other | Admitting: Podiatry

## 2021-01-13 ENCOUNTER — Other Ambulatory Visit: Payer: Self-pay | Admitting: Internal Medicine

## 2021-01-27 DIAGNOSIS — R197 Diarrhea, unspecified: Secondary | ICD-10-CM | POA: Diagnosis not present

## 2021-02-01 DIAGNOSIS — F5104 Psychophysiologic insomnia: Secondary | ICD-10-CM | POA: Diagnosis not present

## 2021-02-02 DIAGNOSIS — Z1231 Encounter for screening mammogram for malignant neoplasm of breast: Secondary | ICD-10-CM | POA: Diagnosis not present

## 2021-02-02 DIAGNOSIS — Z803 Family history of malignant neoplasm of breast: Secondary | ICD-10-CM | POA: Diagnosis not present

## 2021-02-04 DIAGNOSIS — R197 Diarrhea, unspecified: Secondary | ICD-10-CM | POA: Diagnosis not present

## 2021-02-04 DIAGNOSIS — R194 Change in bowel habit: Secondary | ICD-10-CM | POA: Diagnosis not present

## 2021-02-04 DIAGNOSIS — R159 Full incontinence of feces: Secondary | ICD-10-CM | POA: Diagnosis not present

## 2021-02-17 DIAGNOSIS — R159 Full incontinence of feces: Secondary | ICD-10-CM | POA: Diagnosis not present

## 2021-02-17 DIAGNOSIS — R197 Diarrhea, unspecified: Secondary | ICD-10-CM | POA: Diagnosis not present

## 2021-02-17 DIAGNOSIS — R194 Change in bowel habit: Secondary | ICD-10-CM | POA: Diagnosis not present

## 2021-03-02 ENCOUNTER — Other Ambulatory Visit: Payer: Self-pay | Admitting: Internal Medicine

## 2021-03-07 NOTE — Telephone Encounter (Signed)
PA required for PROLIA  PA PROCESS DETAILS: PA is required. Providers may call Medical Utilization at [phone] to initiate. Forms may be accessed online at [email or web address]

## 2021-03-09 ENCOUNTER — Encounter: Payer: Self-pay | Admitting: Internal Medicine

## 2021-03-10 ENCOUNTER — Ambulatory Visit: Payer: Medicare Other | Admitting: Internal Medicine

## 2021-03-16 NOTE — Telephone Encounter (Signed)
[place] another [place] and is transferring care to provider in [place].   Pt archived in [email or web address].  Please advise if patient and/or provider wish to proceed with Prolia therpay.

## 2021-03-18 ENCOUNTER — Ambulatory Visit (INDEPENDENT_AMBULATORY_CARE_PROVIDER_SITE_OTHER): Payer: Medicare Other

## 2021-03-18 DIAGNOSIS — I428 Other cardiomyopathies: Secondary | ICD-10-CM | POA: Diagnosis not present

## 2021-03-18 LAB — CUP PACEART REMOTE DEVICE CHECK
Battery Remaining Longevity: 126 mo
Battery Voltage: 3.02 V
Brady Statistic AP VP Percent: 0 %
Brady Statistic AP VS Percent: 0 %
Brady Statistic AS VP Percent: 99.73 %
Brady Statistic AS VS Percent: 0.27 %
Brady Statistic RA Percent Paced: 0 %
Brady Statistic RV Percent Paced: 99.73 %
Date Time Interrogation Session: 20230127044826
Implantable Lead Implant Date: 20210430
Implantable Lead Location: 753860
Implantable Lead Model: 3830
Implantable Pulse Generator Implant Date: 20210430
Lead Channel Impedance Value: 3382 Ohm
Lead Channel Impedance Value: 3382 Ohm
Lead Channel Impedance Value: 361 Ohm
Lead Channel Impedance Value: 532 Ohm
Lead Channel Pacing Threshold Amplitude: 0.625 V
Lead Channel Pacing Threshold Pulse Width: 0.4 ms
Lead Channel Sensing Intrinsic Amplitude: 15.875 mV
Lead Channel Sensing Intrinsic Amplitude: 15.875 mV
Lead Channel Setting Pacing Amplitude: 2.5 V
Lead Channel Setting Pacing Pulse Width: 0.4 ms
Lead Channel Setting Sensing Sensitivity: 4 mV

## 2021-03-28 NOTE — Progress Notes (Signed)
Remote pacemaker transmission.   

## 2021-04-08 ENCOUNTER — Ambulatory Visit: Payer: Medicare Other | Admitting: Podiatry

## 2021-04-10 ENCOUNTER — Other Ambulatory Visit: Payer: Self-pay | Admitting: Internal Medicine

## 2021-05-17 ENCOUNTER — Other Ambulatory Visit: Payer: Self-pay | Admitting: Internal Medicine

## 2021-06-22 ENCOUNTER — Other Ambulatory Visit: Payer: Self-pay | Admitting: Internal Medicine

## 2021-06-27 ENCOUNTER — Other Ambulatory Visit: Payer: Self-pay | Admitting: Internal Medicine

## 2021-06-27 DIAGNOSIS — E890 Postprocedural hypothyroidism: Secondary | ICD-10-CM

## 2021-06-29 MED ORDER — LEVOTHYROXINE SODIUM 150 MCG PO TABS: 150.0000 ug | ORAL_TABLET | Freq: Every day | ORAL | 1 refills | Status: AC

## 2021-12-30 LAB — PROINSULIN/INSULIN RATIO
Insulin: 1.8 u[IU]/mL
Insulin: 14 u[IU]/mL
Proinsulin: 11.8 pmol/L — ABNORMAL HIGH
Proinsulin: 5.7 pmol/L

## 2022-06-28 ENCOUNTER — Other Ambulatory Visit: Payer: Self-pay | Admitting: Internal Medicine

## 2022-06-28 DIAGNOSIS — E890 Postprocedural hypothyroidism: Secondary | ICD-10-CM
